# Patient Record
Sex: Male | Born: 1935 | Race: White | Hispanic: No | Marital: Married | Smoking: Former smoker
Health system: Southern US, Community
[De-identification: ages and names within clinical notes are randomized; demographics above are authoritative.]

## PROBLEM LIST (undated history)

## (undated) DIAGNOSIS — I714 Abdominal aortic aneurysm, without rupture, unspecified: Secondary | ICD-10-CM

## (undated) DIAGNOSIS — I951 Orthostatic hypotension: Secondary | ICD-10-CM

## (undated) DIAGNOSIS — E559 Vitamin D deficiency, unspecified: Secondary | ICD-10-CM

## (undated) DIAGNOSIS — N419 Inflammatory disease of prostate, unspecified: Secondary | ICD-10-CM

## (undated) DIAGNOSIS — Z8719 Personal history of other diseases of the digestive system: Secondary | ICD-10-CM

## (undated) DIAGNOSIS — E7800 Pure hypercholesterolemia, unspecified: Secondary | ICD-10-CM

## (undated) DIAGNOSIS — K635 Polyp of colon: Secondary | ICD-10-CM

## (undated) DIAGNOSIS — T884XXA Failed or difficult intubation, initial encounter: Secondary | ICD-10-CM

## (undated) DIAGNOSIS — I2510 Atherosclerotic heart disease of native coronary artery without angina pectoris: Secondary | ICD-10-CM

## (undated) DIAGNOSIS — E119 Type 2 diabetes mellitus without complications: Secondary | ICD-10-CM

## (undated) DIAGNOSIS — I10 Essential (primary) hypertension: Secondary | ICD-10-CM

## (undated) DIAGNOSIS — I639 Cerebral infarction, unspecified: Secondary | ICD-10-CM

## (undated) DIAGNOSIS — E538 Deficiency of other specified B group vitamins: Secondary | ICD-10-CM

## (undated) DIAGNOSIS — G459 Transient cerebral ischemic attack, unspecified: Secondary | ICD-10-CM

## (undated) DIAGNOSIS — K219 Gastro-esophageal reflux disease without esophagitis: Secondary | ICD-10-CM

## (undated) DIAGNOSIS — I723 Aneurysm of iliac artery: Secondary | ICD-10-CM

## (undated) DIAGNOSIS — I779 Disorder of arteries and arterioles, unspecified: Secondary | ICD-10-CM

## (undated) HISTORY — DX: Aneurysm of iliac artery: I72.3

## (undated) HISTORY — PX: CORONARY ANGIOPLASTY WITH STENT PLACEMENT: SHX49

## (undated) HISTORY — DX: Atherosclerotic heart disease of native coronary artery without angina pectoris: I25.10

## (undated) HISTORY — DX: Gastro-esophageal reflux disease without esophagitis: K21.9

## (undated) HISTORY — DX: Vitamin D deficiency, unspecified: E55.9

## (undated) HISTORY — DX: Personal history of other diseases of the digestive system: Z87.19

## (undated) HISTORY — DX: Pure hypercholesterolemia, unspecified: E78.00

## (undated) HISTORY — DX: Deficiency of other specified B group vitamins: E53.8

## (undated) HISTORY — DX: Inflammatory disease of prostate, unspecified: N41.9

## (undated) HISTORY — DX: Polyp of colon: K63.5

## (undated) HISTORY — DX: Essential (primary) hypertension: I10

## (undated) HISTORY — DX: Type 2 diabetes mellitus without complications: E11.9

## (undated) HISTORY — PX: KNEE SURGERY: SHX244

---

## 1967-05-03 HISTORY — PX: BLADDER SURGERY: SHX569

## 1998-02-03 ENCOUNTER — Ambulatory Visit (HOSPITAL_COMMUNITY): Admission: RE | Admit: 1998-02-03 | Discharge: 1998-02-03 | Payer: Self-pay | Admitting: Family Medicine

## 1998-10-16 ENCOUNTER — Encounter (INDEPENDENT_AMBULATORY_CARE_PROVIDER_SITE_OTHER): Payer: Self-pay | Admitting: Specialist

## 1998-10-16 ENCOUNTER — Ambulatory Visit (HOSPITAL_COMMUNITY): Admission: RE | Admit: 1998-10-16 | Discharge: 1998-10-16 | Payer: Self-pay | Admitting: *Deleted

## 1999-08-12 ENCOUNTER — Encounter: Payer: Self-pay | Admitting: Internal Medicine

## 1999-08-12 ENCOUNTER — Ambulatory Visit (HOSPITAL_COMMUNITY): Admission: RE | Admit: 1999-08-12 | Discharge: 1999-08-12 | Payer: Self-pay | Admitting: Internal Medicine

## 2003-03-24 ENCOUNTER — Ambulatory Visit (HOSPITAL_COMMUNITY): Admission: RE | Admit: 2003-03-24 | Discharge: 2003-03-24 | Payer: Self-pay | Admitting: Gastroenterology

## 2003-12-10 ENCOUNTER — Ambulatory Visit (HOSPITAL_COMMUNITY): Admission: RE | Admit: 2003-12-10 | Discharge: 2003-12-10 | Payer: Self-pay | Admitting: Internal Medicine

## 2004-12-21 ENCOUNTER — Encounter: Admission: RE | Admit: 2004-12-21 | Discharge: 2005-03-21 | Payer: Self-pay | Admitting: Internal Medicine

## 2005-05-06 ENCOUNTER — Ambulatory Visit (HOSPITAL_COMMUNITY): Admission: RE | Admit: 2005-05-06 | Discharge: 2005-05-06 | Payer: Self-pay | Admitting: Internal Medicine

## 2007-11-28 ENCOUNTER — Ambulatory Visit: Payer: Self-pay | Admitting: Cardiology

## 2007-11-28 ENCOUNTER — Inpatient Hospital Stay (HOSPITAL_COMMUNITY): Admission: AD | Admit: 2007-11-28 | Discharge: 2007-11-29 | Payer: Self-pay | Admitting: Cardiology

## 2007-11-29 ENCOUNTER — Ambulatory Visit: Admission: RE | Admit: 2007-11-29 | Discharge: 2007-11-29 | Payer: Self-pay | Admitting: Cardiology

## 2007-12-18 ENCOUNTER — Ambulatory Visit: Payer: Self-pay

## 2007-12-24 ENCOUNTER — Ambulatory Visit: Payer: Self-pay | Admitting: Cardiology

## 2007-12-31 ENCOUNTER — Ambulatory Visit: Payer: Self-pay | Admitting: Cardiology

## 2007-12-31 LAB — CONVERTED CEMR LAB
ALT: 11 units/L (ref 0–53)
AST: 19 units/L (ref 0–37)
Albumin: 3.6 g/dL (ref 3.5–5.2)
Alkaline Phosphatase: 68 units/L (ref 39–117)
BUN: 12 mg/dL (ref 6–23)
Basophils Absolute: 0 10*3/uL (ref 0.0–0.1)
Basophils Relative: 0.6 % (ref 0.0–3.0)
Bilirubin, Direct: 0.1 mg/dL (ref 0.0–0.3)
CO2: 32 meq/L (ref 19–32)
Calcium: 8.8 mg/dL (ref 8.4–10.5)
Chloride: 111 meq/L (ref 96–112)
Cholesterol: 88 mg/dL (ref 0–200)
Creatinine, Ser: 1.1 mg/dL (ref 0.4–1.5)
Eosinophils Absolute: 0.3 10*3/uL (ref 0.0–0.7)
Eosinophils Relative: 5.1 % — ABNORMAL HIGH (ref 0.0–5.0)
GFR calc Af Amer: 85 mL/min
GFR calc non Af Amer: 70 mL/min
Glucose, Bld: 106 mg/dL — ABNORMAL HIGH (ref 70–99)
HCT: 36.1 % — ABNORMAL LOW (ref 39.0–52.0)
HDL: 31.2 mg/dL — ABNORMAL LOW (ref >39.0)
Hemoglobin: 12.6 g/dL — ABNORMAL LOW (ref 13.0–17.0)
LDL Cholesterol: 44 mg/dL (ref 0–99)
Lymphocytes Relative: 23.8 % (ref 12.0–46.0)
MCHC: 34.8 g/dL (ref 30.0–36.0)
MCV: 96.9 fL (ref 78.0–100.0)
Monocytes Absolute: 0.5 10*3/uL (ref 0.1–1.0)
Monocytes Relative: 7.9 % (ref 3.0–12.0)
Neutro Abs: 3.9 10*3/uL (ref 1.4–7.7)
Neutrophils Relative %: 62.6 % (ref 43.0–77.0)
Platelets: 230 10*3/uL (ref 150–400)
Potassium: 4.3 meq/L (ref 3.5–5.1)
RBC: 3.73 M/uL — ABNORMAL LOW (ref 4.22–5.81)
RDW: 12.4 % (ref 11.5–14.6)
Sodium: 144 meq/L (ref 135–145)
Total Bilirubin: 0.8 mg/dL (ref 0.3–1.2)
Total CHOL/HDL Ratio: 2.8
Total Protein: 6.5 g/dL (ref 6.0–8.3)
Triglycerides: 64 mg/dL (ref 0–149)
VLDL: 13 mg/dL (ref 0–40)
WBC: 6.2 10*3/uL (ref 4.5–10.5)

## 2008-01-15 ENCOUNTER — Ambulatory Visit: Payer: Self-pay | Admitting: Vascular Surgery

## 2008-06-19 ENCOUNTER — Ambulatory Visit: Payer: Self-pay | Admitting: Cardiovascular Disease

## 2008-06-19 ENCOUNTER — Ambulatory Visit: Payer: Self-pay | Admitting: Cardiology

## 2008-06-27 ENCOUNTER — Ambulatory Visit: Payer: Self-pay | Admitting: Cardiology

## 2008-06-27 LAB — CONVERTED CEMR LAB
BUN: 17 mg/dL (ref 6–23)
CO2: 30 meq/L (ref 19–32)
Calcium: 9.1 mg/dL (ref 8.4–10.5)
Chloride: 104 meq/L (ref 96–112)
Creatinine, Ser: 1.1 mg/dL (ref 0.4–1.5)
GFR calc Af Amer: 84 mL/min
GFR calc non Af Amer: 70 mL/min
Glucose, Bld: 90 mg/dL (ref 70–99)
Potassium: 4.6 meq/L (ref 3.5–5.1)
Sodium: 140 meq/L (ref 135–145)

## 2008-07-01 ENCOUNTER — Ambulatory Visit: Payer: Self-pay | Admitting: Vascular Surgery

## 2008-10-07 ENCOUNTER — Telehealth: Payer: Self-pay | Admitting: Cardiology

## 2008-10-09 ENCOUNTER — Telehealth: Payer: Self-pay | Admitting: Cardiology

## 2008-11-24 ENCOUNTER — Ambulatory Visit: Payer: Self-pay | Admitting: Cardiology

## 2008-11-24 ENCOUNTER — Encounter (INDEPENDENT_AMBULATORY_CARE_PROVIDER_SITE_OTHER): Payer: Self-pay | Admitting: *Deleted

## 2008-11-24 LAB — CONVERTED CEMR LAB
ALT: 14 units/L (ref 0–53)
AST: 30 units/L (ref 0–37)
Albumin: 3.7 g/dL (ref 3.5–5.2)
Alkaline Phosphatase: 64 units/L (ref 39–117)
Bilirubin, Direct: 0.1 mg/dL (ref 0.0–0.3)
Cholesterol: 109 mg/dL (ref 0–200)
HDL: 37.9 mg/dL — ABNORMAL LOW (ref >39.00)
LDL Cholesterol: 56 mg/dL (ref 0–99)
Total Bilirubin: 0.9 mg/dL (ref 0.3–1.2)
Total CHOL/HDL Ratio: 3
Total Protein: 7 g/dL (ref 6.0–8.3)
Triglycerides: 77 mg/dL (ref 0.0–149.0)
VLDL: 15.4 mg/dL (ref 0.0–40.0)

## 2008-11-25 ENCOUNTER — Telehealth (INDEPENDENT_AMBULATORY_CARE_PROVIDER_SITE_OTHER): Payer: Self-pay | Admitting: *Deleted

## 2008-12-22 ENCOUNTER — Encounter: Payer: Self-pay | Admitting: Cardiology

## 2008-12-22 ENCOUNTER — Ambulatory Visit: Payer: Self-pay

## 2008-12-24 ENCOUNTER — Telehealth (INDEPENDENT_AMBULATORY_CARE_PROVIDER_SITE_OTHER): Payer: Self-pay | Admitting: *Deleted

## 2009-01-29 ENCOUNTER — Encounter: Payer: Self-pay | Admitting: Cardiology

## 2009-01-29 ENCOUNTER — Ambulatory Visit: Payer: Self-pay | Admitting: Vascular Surgery

## 2009-02-27 DIAGNOSIS — K219 Gastro-esophageal reflux disease without esophagitis: Secondary | ICD-10-CM | POA: Insufficient documentation

## 2009-02-27 DIAGNOSIS — Z8719 Personal history of other diseases of the digestive system: Secondary | ICD-10-CM | POA: Insufficient documentation

## 2009-02-27 DIAGNOSIS — I714 Abdominal aortic aneurysm, without rupture: Secondary | ICD-10-CM | POA: Insufficient documentation

## 2009-02-27 DIAGNOSIS — I2510 Atherosclerotic heart disease of native coronary artery without angina pectoris: Secondary | ICD-10-CM | POA: Insufficient documentation

## 2009-02-27 HISTORY — DX: Personal history of other diseases of the digestive system: Z87.19

## 2009-03-03 ENCOUNTER — Ambulatory Visit: Payer: Self-pay | Admitting: Cardiology

## 2009-03-09 ENCOUNTER — Encounter: Payer: Self-pay | Admitting: Cardiology

## 2009-04-28 ENCOUNTER — Telehealth: Payer: Self-pay | Admitting: Cardiology

## 2009-05-11 ENCOUNTER — Ambulatory Visit: Payer: Self-pay | Admitting: Cardiology

## 2009-05-12 LAB — CONVERTED CEMR LAB
ALT: 11 units/L (ref 0–53)
AST: 19 units/L (ref 0–37)
Albumin: 3.5 g/dL (ref 3.5–5.2)
Alkaline Phosphatase: 59 units/L (ref 39–117)
Bilirubin, Direct: 0 mg/dL (ref 0.0–0.3)
Cholesterol: 166 mg/dL (ref 0–200)
HDL: 38.5 mg/dL — ABNORMAL LOW (ref >39.00)
LDL Cholesterol: 106 mg/dL — ABNORMAL HIGH (ref 0–99)
Total Bilirubin: 0.7 mg/dL (ref 0.3–1.2)
Total CHOL/HDL Ratio: 4
Total Protein: 6.7 g/dL (ref 6.0–8.3)
Triglycerides: 110 mg/dL (ref 0.0–149.0)
VLDL: 22 mg/dL (ref 0.0–40.0)

## 2009-06-16 ENCOUNTER — Telehealth (INDEPENDENT_AMBULATORY_CARE_PROVIDER_SITE_OTHER): Payer: Self-pay | Admitting: *Deleted

## 2009-06-16 ENCOUNTER — Ambulatory Visit (HOSPITAL_COMMUNITY): Admission: RE | Admit: 2009-06-16 | Discharge: 2009-06-16 | Payer: Self-pay | Admitting: Internal Medicine

## 2009-10-29 ENCOUNTER — Telehealth: Payer: Self-pay | Admitting: Cardiology

## 2009-12-14 ENCOUNTER — Encounter: Payer: Self-pay | Admitting: Cardiology

## 2009-12-22 ENCOUNTER — Encounter: Payer: Self-pay | Admitting: Cardiology

## 2009-12-23 ENCOUNTER — Ambulatory Visit: Payer: Self-pay

## 2009-12-23 ENCOUNTER — Encounter: Payer: Self-pay | Admitting: Cardiology

## 2010-03-12 ENCOUNTER — Ambulatory Visit: Payer: Self-pay | Admitting: Cardiology

## 2010-03-12 ENCOUNTER — Encounter: Payer: Self-pay | Admitting: Cardiology

## 2010-04-05 ENCOUNTER — Ambulatory Visit (HOSPITAL_COMMUNITY)
Admission: EM | Admit: 2010-04-05 | Discharge: 2010-04-07 | Payer: Self-pay | Source: Home / Self Care | Attending: Cardiology | Admitting: Cardiology

## 2010-05-06 ENCOUNTER — Ambulatory Visit
Admission: RE | Admit: 2010-05-06 | Discharge: 2010-05-06 | Payer: Self-pay | Source: Home / Self Care | Attending: Cardiology | Admitting: Cardiology

## 2010-05-06 ENCOUNTER — Encounter (HOSPITAL_COMMUNITY): Admission: RE | Admit: 2010-05-06 | Payer: Self-pay | Source: Home / Self Care | Admitting: Cardiology

## 2010-06-01 NOTE — Progress Notes (Signed)
  Phone Note Outgoing Call   Call placed by: Deliah Goody, RN,  November 25, 2008 11:45 AM Summary of Call: spoke with pt, he was seen by research yesterday and stated he was having the same type of pain he had when taking zocor. he is currently taking crestor. option given to pt to stop crestor or to cut in half to see if that helps his discomfort. pt declined at this time and will cont on crestor until seen by dr Jens Som in november. he will call prior to that appt with any problems. Deliah Goody, RN  November 25, 2008 11:48 AM

## 2010-06-01 NOTE — Progress Notes (Signed)
----   Converted from flag ---- ---- 12/23/2008 7:47 AM, Ferman Hamming, MD, [facility] wrote: please ask pt to continue plavix until at least next ov. BC ---- 12/22/2008 8:47 AM, Missy Al-Rammal, RVT, RDCS wrote: Pt was in today for iliac aneurysm surveillance (stable).  He said he was supposed to be on Plavix for one year, and wants to know when/how/whether he can stop taking it now.  Please advise. (His phone # [phone]).  Thanks. ------------------------------       Additional Follow-up for Phone Call Additional follow up Details #2::    spoke with pt, he has an appt with dr Jens Som on 03/03/09, he will cont plavix until seen Deliah Goody, RN  December 24, 2008 2:54 PM

## 2010-06-01 NOTE — Letter (Signed)
Summary: [facility] Note  [facility] Note   Imported By: Roderic Ovens 02/11/2009 15:49:23  _____________________________________________________________________  External Attachment:    Type:   Image     Comment:   External Document

## 2010-06-01 NOTE — Letter (Signed)
Summary: Custom - Lipid  [facility], Main Office  [address]   [place], [place] [address]   Phone: [phone]  Fax: [phone]     November 24, 2008 MRN: [id number]   [name] [address] [place], [place]  [address]   Dear Mr. Bechtol,  We have reviewed your cholesterol results.  They are as follows:     Total Cholesterol:    109 (Desirable: less than 200)       HDL  Cholesterol:     37.90  (Desirable: greater than 40 for men and 50 for women)       LDL Cholesterol:       56  (Desirable: less than 100 for low risk and less than 70 for moderate to high risk)       Triglycerides:       77.0  (Desirable: less than 150)  Our recommendations include:These numbers look good. Continue on the same medicine. Liver function is normal. Take care, Dr. Darel Hong.    Call our office at the number listed above if you have any questions.  Lowering your LDL cholesterol is important, but it is only one of a large number of "risk factors" that may indicate that you are at risk for heart disease, stroke or other complications of hardening of the arteries.  Other risk factors include:   A.  Cigarette Smoking* B.  High Blood Pressure* C.  Obesity* D.   Low HDL Cholesterol (see yours above)* E.   Diabetes Mellitus (higher risk if your is uncontrolled) F.  Family history of premature heart disease G.  Previous history of stroke or cardiovascular disease    *These are risk factors YOU HAVE CONTROL OVER.  For more information, visit .  There is now evidence that lowering the TOTAL CHOLESTEROL AND LDL CHOLESTEROL can reduce the risk of heart disease.  The American Heart Association recommends the following guidelines for the treatment of elevated cholesterol:  1.  If there is now current heart disease and less than two risk factors, TOTAL CHOLESTEROL should be less than 200 and LDL CHOLESTEROL should be less than 100. 2.  If there is current heart disease or  two or more risk factors, TOTAL CHOLESTEROL should be less than 200 and LDL CHOLESTEROL should be less than 70.  A diet low in cholesterol, saturated fat, and calories is the cornerstone of treatment for elevated cholesterol.  Cessation of smoking and exercise are also important in the management of elevated cholesterol and preventing vascular disease.  Studies have shown that 30 to 60 minutes of physical activity most days can help lower blood pressure, lower cholesterol, and keep your weight at a healthy level.  Drug therapy is used when cholesterol levels do not respond to therapeutic lifestyle changes (smoking cessation, diet, and exercise) and remains unacceptably high.  If medication is started, it is important to have you levels checked periodically to evaluate the need for further treatment options.  Thank you,    [employer] Team

## 2010-06-01 NOTE — Assessment & Plan Note (Signed)
Summary: per check out/sf   History of Present Illness: [name] is a pleasant  gentleman who has a history of coronary artery disease.  He had prior PCI of his LAD with a drug- eluting stent in July 2009.  He also has bilateral common iliac artery aneurysms. Last abdominal and iliac ultrasound was performed in August of 2011. There was an ectatic aorta. The iliac aneurysms were unchanged in size and measured 2.4 cm on the left and 2.1 cm on the right. I last saw him in November of 2010. Since then he denies any dyspnea on exertion, orthopnea, PND, pedal edema, palpitations, syncope or chest pain.   Current Medications (verified): 1)  Plavix 75 Mg Tabs (Clopidogrel Bisulfate) .... Take One Tablet By Mouth Daily 2)  Atenolol 25 Mg Tabs (Atenolol) .... Take Half Tablet By Mouth Daily 3)  Omeprazole 20 Mg Cpdr (Omeprazole) .Marland Kitchen.. 1 Tab By Mouth Once Daily 4)  Aspirin 81 Mg  Tabs (Aspirin) .... 4 Tabs By Mouth Once Daily 5)  Vitamin D3 2000 Unit Caps (Cholecalciferol) .... Tab By Mouth Once Daily 6)  B Complex  Tabs (B Complex Vitamins) .Marland Kitchen.. 1 Tab By Mouth Once Daily 7)  Multivitamins   Tabs (Multiple Vitamin) .Marland Kitchen.. 1 Tab By Mouth Once Daily 8)  Vit B 12 Inj .... Monthly 9)  Nitrostat 0.4 Mg Subl (Nitroglycerin) .Marland Kitchen.. 1 Tablet Under Tongue At Onset of Chest Pain; You May Repeat Every 5 Minutes For Up To 3 Doses. 10)  Gemfibrozil 600 Mg Tabs (Gemfibrozil) .Marland Kitchen.. 1 Tab By Mouth Two Times A Day 11)  Flax Seed Oil  (Flaxseed (Linseed)) .Marland Kitchen.. 1 Tab By Mouth Once Daily 12)  Citrucel .... Daily  Past History:  Past Medical History: CAD (ICD-414.00) ILIAC ARTERY ANEURYSM (ICD-442.2) IRRITABLE BOWEL SYNDROME, HX OF (ICD-V12.79) GERD (ICD-530.81) OTHER AND UNSPECIFIED HYPERLIPIDEMIA (ICD-272.4)  Social History: Reviewed history from 03/03/2009 and no changes required. Tobacco Use - No.   Review of Systems       Some arthralgias but no fevers or chills, productive cough, hemoptysis, dysphasia,  odynophagia, melena, hematochezia, dysuria, hematuria, rash, seizure activity, orthopnea, PND, pedal edema, claudication. Remaining systems are negative.   Vital Signs:  Patient profile:   77 year old male Height:      72 inches Weight:      178 pounds BMI:     24.23 Pulse rate:   51 / minute Resp:     14 per minute BP sitting:   119 / 65  (left arm)  Vitals Entered By: Kem Parkinson (March 12, 2010 2:14 PM)  Physical Exam  General:  Well-developed well-nourished in no acute distress.  Skin is warm and dry.  HEENT is normal.  Neck is supple. No thyromegaly.  Chest is clear to auscultation with normal expansion.  Cardiovascular exam is regular rate and rhythm.  Abdominal exam nontender or distended. No masses palpated. Extremities show no edema. neuro grossly intact    EKG  Procedure date:  03/12/2010  Findings:      Sinus rhythm with no ST changes.  Impression & Recommendations:  Problem # 1:  CAD (ICD-414.00) Continue aspirin but discontinued Plavix. Continue beta blocker. Intolerant to statins. The following medications were removed from the medication list:    Plavix 75 Mg Tabs (Clopidogrel bisulfate) .Marland Kitchen... Take one tablet by mouth daily His updated medication list for this problem includes:    Atenolol 25 Mg Tabs (Atenolol) .Marland Kitchen... Take half tablet by mouth daily    Aspirin  81 Mg Tabs (Aspirin) .Marland KitchenMarland KitchenMarland KitchenMarland Kitchen 4 tabs by mouth once daily    Nitrostat 0.4 Mg Subl (Nitroglycerin) .Marland Kitchen... 1 tablet under tongue at onset of chest pain; you may repeat every 5 minutes for up to 3 doses.  Problem # 2:  ILIAC ARTERY ANEURYSM (ICD-442.2) Followup ultrasound in August of 2012.  Problem # 3:  GERD (ICD-530.81)  His updated medication list for this problem includes:    Omeprazole 20 Mg Cpdr (Omeprazole) .Marland Kitchen... 1 tab by mouth once daily  Problem # 4:  OTHER AND UNSPECIFIED HYPERLIPIDEMIA (ICD-272.4) Intolerant to statins. Continue present medications. Lipid and liver monitored by  primary care. The following medications were removed from the medication list:    Pravachol 40 Mg Tabs (Pravastatin sodium) .Marland Kitchen... 1 in the evening His updated medication list for this problem includes:    Gemfibrozil 600 Mg Tabs (Gemfibrozil) .Marland Kitchen... 1 tab by mouth two times a day  Patient Instructions: 1)  Your physician has recommended you make the following change in your medication: STOP PLAVIX 2)  Your physician wants you to follow-up in:ONE YEAR   You will receive a reminder letter in the mail two months in advance. If you don't receive a letter, please call our office to schedule the follow-up appointment.

## 2010-06-01 NOTE — Progress Notes (Signed)
  Phone Note From Other Clinic   Caller: Judy/Dr.[email or web address] Details for Reason: Pt.information Initial call taken by: Marijean Niemann Labs over to [phone] [facility]  June 16, 2009 9:23 AM

## 2010-06-01 NOTE — Miscellaneous (Signed)
Summary: Orders Update  Clinical Lists Changes  Problems: Added new problem of ILIAC ARTERY ANEURYSM (ICD-442.2) Orders: Added new Test order of Abdominal Aorta Duplex (Abd Aorta Duplex) - Signed 

## 2010-06-01 NOTE — Assessment & Plan Note (Signed)
Summary: ROV/ F/U 9 MONTHS/ GD   CC:  check up.  History of Present Illness: [name] is a pleasant  gentleman who has a history of coronary artery disease.  He had prior PCI of his LAD with a drug- eluting stent in July 2009.  He also has bilateral common iliac artery aneurysms. Last abdominal and iliac ultrasound was performed in August of this year. There was no aortic aneurysm. The iliac aneurysms were unchanged in size and measured 2.4 cm on the left and 2.2 cm on the right. I last saw him in February of this year. Since then he denies any dyspnea on exertion, orthopnea, PND, pedal edema, palpitations, syncope or chest pain. He is having difficulties with pain in the muscles in his legs and arms since starting Crestor. This is continuous and causes him difficulty ambulating. He had similar problems with Zocor.  Preventive Screening-Counseling & Management  Alcohol-Tobacco     Smoking Status: never  Current Medications (verified): 1)  Pravachol 40 Mg Tabs (Pravastatin Sodium) .Marland Kitchen.. 1 in The Evening 2)  Plavix 75 Mg Tabs (Clopidogrel Bisulfate) .... Take One Tablet By Mouth Daily 3)  Atenolol 25 Mg Tabs (Atenolol) .... Take Half Tablet By Mouth Daily 4)  Darvocet-N 100 100-650 Mg Tabs (Propoxyphene N-Apap) .... As Needed 5)  Omeprazole 20 Mg Cpdr (Omeprazole) .Marland Kitchen.. 1 Tab By Mouth Once Daily 6)  Aspirin 81 Mg  Tabs (Aspirin) .... 4 Tabs By Mouth Once Daily 7)  Vitamin D3 2000 Unit Caps (Cholecalciferol) .... Tab By Mouth Once Daily 8)  B Complex  Tabs (B Complex Vitamins) .Marland Kitchen.. 1 Tab By Mouth Once Daily 9)  Multivitamins   Tabs (Multiple Vitamin) .Marland Kitchen.. 1 Tab By Mouth Once Daily 10)  Vit B 12 Inj .... Weekly  Past History:  Past Medical History: Current Problems:  CAD (ICD-414.00) ILIAC ARTERY ANEURYSM (ICD-442.2) IRRITABLE BOWEL SYNDROME, HX OF (ICD-V12.79) GERD (ICD-530.81) AAA (ICD-441.4) OTHER AND UNSPECIFIED HYPERLIPIDEMIA (ICD-272.4) ENCOUNTER FOR LONG-TERM USE OF OTHER  MEDICATIONS (ICD-V58.69)  Social History: Reviewed history from 02/27/2009 and no changes required. Tobacco Use - No.  Smoking Status:  never  Review of Systems       Problems with myalgias but no fevers or chills, productive cough, hemoptysis, dysphasia, odynophagia, melena, hematochezia, dysuria, hematuria, rash, seizure activity, orthopnea, PND, pedal edema, claudication. Remaining systems are negative.   Vital Signs:  Patient profile:   77 year old male Height:      72 inches Weight:      178 pounds BMI:     24.23 Pulse rate:   55 / minute Resp:     12 per minute BP sitting:   121 / 68  (left arm)  Vitals Entered By: [name] (March 03, 2009 8:17 AM)  Physical Exam  General:  well-developed well-nourished in no acute distress. Skin is warm and dry. Head:  HEENT is normal. Neck:  supple with no bruits. No thyromegaly noted. Lungs:  clear to auscultation Heart:  regular rate and rhythm Abdomen:  soft and nontender. No masses palpated. Extremities:  no edema. Neurologic:  grossly intact.   EKG  Procedure date:  03/03/2009  Findings:      sinus bradycardia at a rate of 51. First degree AV block. No ST changes.  Impression & Recommendations:  Problem # 1:  CAD (ICD-414.00) Continue aspirin and Plavix as well as low-dose beta blocker. The following medications were removed from the medication list:    Lisinopril 10 Mg Tabs (  Lisinopril) .Marland Kitchen... 1 tablet by mouth once a day His updated medication list for this problem includes:    Plavix 75 Mg Tabs (Clopidogrel bisulfate) .Marland Kitchen... Take one tablet by mouth daily    Atenolol 25 Mg Tabs (Atenolol) .Marland Kitchen... Take half tablet by mouth daily    Aspirin 81 Mg Tabs (Aspirin) .Marland KitchenMarland KitchenMarland KitchenMarland Kitchen 4 tabs by mouth once daily  Orders: EKG w/ Interpretation (93000)  Problem # 2:  ILIAC ARTERY ANEURYSM (ICD-442.2) Patient will need followup ultrasound in August of 2011.  Problem # 3:  AAA (ICD-441.4) Patient will need followup  ultrasound in August of 2011.  Problem # 4:  OTHER AND UNSPECIFIED HYPERLIPIDEMIA (ICD-272.4) Patient has developed myalgias on Crestor. He also had this previously with Zocor. We'll discontinue Crestor. In one month we'll begin Pravachol 40 mg p.o. daily to see if he tolerates. If not we will discontinue statins altogether. If he does tolerate we'll check lipids and liver 6 weeks later. His updated medication list for this problem includes:    Pravachol 40 Mg Tabs (Pravastatin sodium) .Marland Kitchen... 1 in the evening  Problem # 5:  GERD (ICD-530.81)  His updated medication list for this problem includes:    Omeprazole 20 Mg Cpdr (Omeprazole) .Marland Kitchen... 1 tab by mouth once daily  Problem # 6:  IRRITABLE BOWEL SYNDROME, HX OF (ICD-V12.79)  Patient Instructions: 1)  Your physician has recommended you make the following change in your medication:  2)  Stop Crestor  3)  Start Pravachol 40mg  1 in the evening--DO NOT start this until you have been off Crestor for 1 month--this will be November 30,2010 --once you start Pravachol if you have problems taking it call our office-- [phone]  4)  Your physician recommends that you return for a FASTING lipid profile/liver profle 6 weeks after starting Pravachol  5)  Your physician recommends that you schedule a follow-up appointment in: 1 year with Dr Shelda Pal should get a letter in the mail 2 months before the appointment time --if you do not get a letter call our office [phone]  Prescriptions: PRAVACHOL 40 MG TABS (PRAVASTATIN SODIUM) 1 in the evening  #30 x 3   Entered by:   Dennis Dung, RN, BSN   Authorized by:   Ferman Hamming, MD, [facility]   Signed by:   Dennis Dung, RN, BSN on 03/03/2009   Method used:   [facility]        [facility]  [place] [address]ret[facility])       [address]       [place], [place]  [address]       Ph: [phone] or [phone]       Fax: [phone]   RxID:   [id number]   Prevention & Chronic  Care Immunizations   Influenza vaccine: Not documented    Tetanus booster: Not documented    Pneumococcal vaccine: Not documented    H. zoster vaccine: Not documented  Colorectal Screening   Hemoccult: Not documented    Colonoscopy: Not documented  Other Screening   PSA: Not documented   Smoking status: never  (03/03/2009)  Lipids   Total Cholesterol: 109  (11/24/2008)   LDL: 56  (11/24/2008)   LDL Direct: Not documented   HDL: 37.90  (11/24/2008)   Triglycerides: 77.0  (11/24/2008)    SGOT (AST): 30  (11/24/2008)   SGPT (ALT): 14  (11/24/2008)   Alkaline phosphatase: 64  (11/24/2008)   Total bilirubin: 0.9  (11/24/2008)  Self-Management Support :  Lipid self-management support: Not documented

## 2010-06-01 NOTE — Progress Notes (Signed)
Summary: refill  Phone Note Refill Request Message from:  Patient on October 29, 2009 8:44 AM  Refills Requested: Medication #1:  Nitro [facility] [phone]  Initial call taken by: Judie Grieve,  October 29, 2009 8:45 AM    New/Updated Medications: NITROSTAT 0.4 MG SUBL (NITROGLYCERIN) 1 tablet under tongue at onset of chest pain; you may repeat every 5 minutes for up to 3 doses. Prescriptions: NITROSTAT 0.4 MG SUBL (NITROGLYCERIN) 1 tablet under tongue at onset of chest pain; you may repeat every 5 minutes for up to 3 doses.  #30 x 12   Entered by:   Kem Parkinson   Authorized by:   Ferman Hamming, MD, [facility]   Signed by:   Kem Parkinson on 10/29/2009   Method used:   [facility]        [facility]  [place] [address]       [address]       [place], [place]  [address]       Ph: [phone] or [phone]       Fax: [phone]   RxID:   [id number]

## 2010-06-01 NOTE — Progress Notes (Signed)
Summary: pt has questions about medication and symptoms he is having  Medications Added LISINOPRIL 10 MG TABS (LISINOPRIL) 1 tablet by mouth once a day       Phone Note Call from Patient Call back at Home Phone [phone]   Caller: Patient Reason for Call: Talk to Nurse, Talk to Doctor Summary of Call: pt is taking simvastatin and it is making him sick / his muscles ache his bones ache and he is gonna stop taking it. pt B/P 95/51 this am and he thinks that is a little low he is taking lisinopril 10mg  what should he do about that Initial call taken by: Omer Jack,  October 07, 2008 10:16 AM  Follow-up for Phone Call        Talked to pt. c/o of bone and muscle ache.According to pt symtoms started after Md put him on Simvastatin 20mg  after a stent placement on Oct. 09. pt stoped taken medication two days ago. He allready feels better. Also he c/o feeling tired his B/P 95/51 today . Dr. Juanda Chance DOD recomended for pt to stop Simvastatin also to cut  Lisinopril  to 5mg   once a day. Pt. verbalised undertanding.  Follow-up by: Ollen Gross, RN, BSN,  October 07, 2008 11:14 AM    New/Updated Medications: LISINOPRIL 10 MG TABS (LISINOPRIL) 1 tablet by mouth once a day   Appended Document: pt has questions about medication and symptoms he is having dc zocor; if symptoms improve, add crestor 10 mg by mouth daily later to see iif he tolerates and then lipids and liver in 6 weeks; dc lisinopril; watch BP; resume if SBP > 130 or DBP > 85  Appended Document: pt has questions about medication and symptoms he is having LMTCB RE MEDS ./CY  Appended Document: pt has questions about medication and symptoms he is having PT AWARE OF LAB RESULTS./CY

## 2010-06-01 NOTE — Miscellaneous (Signed)
Summary: Orders Update  Clinical Lists Changes  Orders: Added new Test order of Abdominal Aorta Duplex (Abd Aorta Duplex) - Signed 

## 2010-06-01 NOTE — Progress Notes (Signed)
Summary: RETURNING CALLED BACK  Phone Note Call from Patient Call back at Home Phone [phone]   Caller: Patient Reason for Call: Talk to Nurse Summary of Call: RETURNING CHRISTINE CALLED BACK. Initial call taken by: Lorne Skeens,  October 09, 2008 12:55 PM  Follow-up for Phone Call        Phone Call Completed, Rx Called In Follow-up by: Scherrie Bateman, LPN,  October 09, 2008 1:29 PM

## 2010-06-01 NOTE — Progress Notes (Signed)
Summary: meds  Phone Note Call from Patient Call back at Home Phone [phone]   Caller: Patient Reason for Call: Talk to Nurse Summary of Call: pt can not take PRAVASTATIN SODIUM, it is making his joints ache Initial call taken by: [name],  April 28, 2009 1:22 PM  Follow-up for Phone Call        spoke with pt, he has stopped the pravastatin and feels better. okay given for pt to stay off pravachol. will let dr Jens Som know pt unable to tolerate crestor, zocor and pravastatin Deliah Goody, RN  April 28, 2009 1:36 PM

## 2010-06-03 NOTE — Assessment & Plan Note (Signed)
Summary: eph   History of Present Illness: [name] is a pleasant  gentleman who has a history of coronary artery disease.  He had prior PCI of his LAD with a drug- eluting stent in July 2009.  He also has bilateral common iliac artery aneurysms. Last abdominal and iliac ultrasound was performed in August of 2011. There was an ectatic aorta. The iliac aneurysms were unchanged in size and measured 2.4 cm on the left and 2.1 cm on the right. Admitted in December of 2011 with chest pain. He had cardiac catheterization which revealed  preserved LV function. Normal LM. 30% mid LAD;  40-50% distal LAD; subbranch of D3 70%; Lcx about 50% proximal lesion before the first small marginal. The right coronary artery has a high-grade stenosis of 95% in the midportion. Pt had PCI of RCA with DES. Since then, the patient denies any dyspnea on exertion, orthopnea, PND, pedal edema, palpitations, syncope or chest pain.   Current Medications (verified): 1)  Atenolol 25 Mg Tabs (Atenolol) .... Take Half Tablet By Mouth Daily 2)  Omeprazole 20 Mg Cpdr (Omeprazole) .Marland Kitchen.. 1 Tab By Mouth Once Daily 3)  Aspirin 325 Mg  Tabs (Aspirin) .Marland Kitchen.. 1  Tab By Mouth Once Daily 4)  Vitamin D3 2000 Unit Caps (Cholecalciferol) .... Tab By Mouth Once Daily 5)  Multivitamins   Tabs (Multiple Vitamin) .Marland Kitchen.. 1 Tab By Mouth Once Daily 6)  Vit B 12 Inj .... Monthly 7)  Nitrostat 0.4 Mg Subl (Nitroglycerin) .Marland Kitchen.. 1 Tablet Under Tongue At Onset of Chest Pain; You May Repeat Every 5 Minutes For Up To 3 Doses. 8)  Gemfibrozil 600 Mg Tabs (Gemfibrozil) .Marland Kitchen.. 1 Tab By Mouth Two Times A Day 9)  Flax Seed Oil  (Flaxseed (Linseed)) .Marland Kitchen.. 1 Tab By Mouth Once Daily 10)  Citrucel .... Daily 11)  Plavix 75 Mg Tabs (Clopidogrel Bisulfate) .... Take One Tablet By Mouth Daily  Past History:  Past Medical History: Reviewed history from 03/12/2010 and no changes required. CAD (ICD-414.00) ILIAC ARTERY ANEURYSM (ICD-442.2) IRRITABLE BOWEL SYNDROME, HX OF  (ICD-V12.79) GERD (ICD-530.81) OTHER AND UNSPECIFIED HYPERLIPIDEMIA (ICD-272.4)  Social History: Reviewed history from 03/03/2009 and no changes required. Tobacco Use - No.   Review of Systems       no fevers or chills, productive cough, hemoptysis, dysphasia, odynophagia, melena, hematochezia, dysuria, hematuria, rash, seizure activity, orthopnea, PND, pedal edema, claudication. Remaining systems are negative.   Vital Signs:  Patient profile:   77 year old male Height:      72 inches Weight:      181 pounds BMI:     24.64 Pulse rate:   60 / minute Resp:     14 per minute BP sitting:   140 / 70  (left arm)  Vitals Entered By: Kem Parkinson (May 06, 2010 12:27 PM)  Physical Exam  General:  Well-developed well-nourished in no acute distress.  Skin is warm and dry.  HEENT is normal.  Neck is supple. No thyromegaly.  Chest is clear to auscultation with normal expansion.  Cardiovascular exam is regular rate and rhythm.  Abdominal exam nontender or distended. No masses palpated. Right groin with no hematoma and no bruit. Extremities show no edema. neuro grossly intact    Impression & Recommendations:  Problem # 1:  CAD (ICD-414.00) Continue aspirin, Plavix and beta blocker. Intolerant to statins. His updated medication list for this problem includes:    Atenolol 25 Mg Tabs (Atenolol) .Marland Kitchen... Take half tablet by mouth daily  Aspirin 325 Mg Tabs (Aspirin) .Marland Kitchen... 1  tab by mouth once daily    Nitrostat 0.4 Mg Subl (Nitroglycerin) .Marland Kitchen... 1 tablet under tongue at onset of chest pain; you may repeat every 5 minutes for up to 3 doses.    Plavix 75 Mg Tabs (Clopidogrel bisulfate) .Marland Kitchen... Take one tablet by mouth daily  Problem # 2:  ILIAC ARTERY ANEURYSM (ICD-442.2) Plan followup ultrasound August 2012.  Problem # 3:  OTHER AND UNSPECIFIED HYPERLIPIDEMIA (ICD-272.4) Continue present medications. Followed by primary care. His updated medication list for this problem  includes:    Gemfibrozil 600 Mg Tabs (Gemfibrozil) .Marland Kitchen... 1 tab by mouth two times a day  Problem # 4:  GERD (ICD-530.81)  His updated medication list for this problem includes:    Omeprazole 20 Mg Cpdr (Omeprazole) .Marland Kitchen... 1 tab by mouth once daily  Patient Instructions: 1)  Your physician wants you to follow-up in: 6 MONTHS  You will receive a reminder letter in the mail two months in advance. If you don't receive a letter, please call our office to schedule the follow-up appointment.

## 2010-07-13 LAB — CARDIAC PANEL(CRET KIN+CKTOT+MB+TROPI)
CK, MB: 1.1 ng/mL (ref 0.3–4.0)
CK, MB: 1.3 ng/mL (ref 0.3–4.0)
CK, MB: 1.8 ng/mL (ref 0.3–4.0)
Relative Index: INVALID (ref 0.0–2.5)
Relative Index: INVALID (ref 0.0–2.5)
Relative Index: INVALID (ref 0.0–2.5)
Total CK: 44 U/L (ref 7–232)
Total CK: 47 U/L (ref 7–232)
Total CK: 55 U/L (ref 7–232)
Troponin I: 0.01 ng/mL (ref 0.00–0.06)
Troponin I: 0.02 ng/mL (ref 0.00–0.06)
Troponin I: 0.02 ng/mL (ref 0.00–0.06)

## 2010-07-13 LAB — BASIC METABOLIC PANEL
BUN: 13 mg/dL (ref 6–23)
BUN: 16 mg/dL (ref 6–23)
CO2: 27 mEq/L (ref 19–32)
CO2: 28 mEq/L (ref 19–32)
Calcium: 8.2 mg/dL — ABNORMAL LOW (ref 8.4–10.5)
Calcium: 8.6 mg/dL (ref 8.4–10.5)
Chloride: 106 mEq/L (ref 96–112)
Chloride: 110 mEq/L (ref 96–112)
Creatinine, Ser: 1.04 mg/dL (ref 0.4–1.5)
Creatinine, Ser: 1.08 mg/dL (ref 0.4–1.5)
GFR calc Af Amer: >60 mL/min (ref >60)
GFR calc Af Amer: >60 mL/min (ref >60)
GFR calc non Af Amer: >60 mL/min (ref >60)
GFR calc non Af Amer: >60 mL/min (ref >60)
Glucose, Bld: 107 mg/dL — ABNORMAL HIGH (ref 70–99)
Glucose, Bld: 99 mg/dL (ref 70–99)
Potassium: 3.9 mEq/L (ref 3.5–5.1)
Potassium: 3.9 mEq/L (ref 3.5–5.1)
Sodium: 140 mEq/L (ref 135–145)
Sodium: 140 mEq/L (ref 135–145)

## 2010-07-13 LAB — CBC
HCT: 30.1 % — ABNORMAL LOW (ref 39.0–52.0)
HCT: 33.3 % — ABNORMAL LOW (ref 39.0–52.0)
HCT: 34.1 % — ABNORMAL LOW (ref 39.0–52.0)
Hemoglobin: 10.2 g/dL — ABNORMAL LOW (ref 13.0–17.0)
Hemoglobin: 11.2 g/dL — ABNORMAL LOW (ref 13.0–17.0)
Hemoglobin: 11.5 g/dL — ABNORMAL LOW (ref 13.0–17.0)
MCH: 32.6 pg (ref 26.0–34.0)
MCH: 32.6 pg (ref 26.0–34.0)
MCH: 32.8 pg (ref 26.0–34.0)
MCHC: 33.6 g/dL (ref 30.0–36.0)
MCHC: 33.7 g/dL (ref 30.0–36.0)
MCHC: 33.9 g/dL (ref 30.0–36.0)
MCV: 96.6 fL (ref 78.0–100.0)
MCV: 96.8 fL (ref 78.0–100.0)
MCV: 96.8 fL (ref 78.0–100.0)
Platelets: 193 10*3/uL (ref 150–400)
Platelets: 213 10*3/uL (ref 150–400)
Platelets: 224 10*3/uL (ref 150–400)
RBC: 3.11 MIL/uL — ABNORMAL LOW (ref 4.22–5.81)
RBC: 3.44 MIL/uL — ABNORMAL LOW (ref 4.22–5.81)
RBC: 3.53 MIL/uL — ABNORMAL LOW (ref 4.22–5.81)
RDW: 12.7 % (ref 11.5–15.5)
RDW: 12.7 % (ref 11.5–15.5)
RDW: 12.8 % (ref 11.5–15.5)
WBC: 6.6 10*3/uL (ref 4.0–10.5)
WBC: 6.9 10*3/uL (ref 4.0–10.5)
WBC: 7 10*3/uL (ref 4.0–10.5)

## 2010-07-13 LAB — CK TOTAL AND CKMB (NOT AT ARMC)
CK, MB: 1.7 ng/mL (ref 0.3–4.0)
Relative Index: INVALID (ref 0.0–2.5)
Total CK: 61 U/L (ref 7–232)

## 2010-07-13 LAB — DIFFERENTIAL
Basophils Absolute: 0 10*3/uL (ref 0.0–0.1)
Basophils Relative: 0 % (ref 0–1)
Eosinophils Absolute: 0.2 10*3/uL (ref 0.0–0.7)
Eosinophils Relative: 2 % (ref 0–5)
Lymphocytes Relative: 26 % (ref 12–46)
Lymphs Abs: 1.8 10*3/uL (ref 0.7–4.0)
Monocytes Absolute: 0.5 10*3/uL (ref 0.1–1.0)
Monocytes Relative: 7 % (ref 3–12)
Neutro Abs: 4.5 10*3/uL (ref 1.7–7.7)
Neutrophils Relative %: 65 % (ref 43–77)

## 2010-07-13 LAB — COMPREHENSIVE METABOLIC PANEL
ALT: 13 U/L (ref 0–53)
AST: 22 U/L (ref 0–37)
Albumin: 3.3 g/dL — ABNORMAL LOW (ref 3.5–5.2)
Alkaline Phosphatase: 67 U/L (ref 39–117)
BUN: 16 mg/dL (ref 6–23)
CO2: 28 mEq/L (ref 19–32)
Calcium: 8.7 mg/dL (ref 8.4–10.5)
Chloride: 105 mEq/L (ref 96–112)
Creatinine, Ser: 1.09 mg/dL (ref 0.4–1.5)
GFR calc Af Amer: >60 mL/min (ref >60)
GFR calc non Af Amer: >60 mL/min (ref >60)
Glucose, Bld: 99 mg/dL (ref 70–99)
Potassium: 4.3 mEq/L (ref 3.5–5.1)
Sodium: 138 mEq/L (ref 135–145)
Total Bilirubin: 0.5 mg/dL (ref 0.3–1.2)
Total Protein: 6.3 g/dL (ref 6.0–8.3)

## 2010-07-13 LAB — GLUCOSE, CAPILLARY
Glucose-Capillary: 105 mg/dL — ABNORMAL HIGH (ref 70–99)
Glucose-Capillary: 106 mg/dL — ABNORMAL HIGH (ref 70–99)
Glucose-Capillary: 109 mg/dL — ABNORMAL HIGH (ref 70–99)
Glucose-Capillary: 125 mg/dL — ABNORMAL HIGH (ref 70–99)
Glucose-Capillary: 98 mg/dL (ref 70–99)

## 2010-07-13 LAB — PLATELET INHIBITION P2Y12
P2Y12 % Inhibition: 10 %
Platelet Function  P2Y12: 274 [PRU] (ref 194–418)
Platelet Function Baseline: 305 [PRU] (ref 194–418)

## 2010-07-13 LAB — PROTIME-INR
INR: 1.05 (ref 0.00–1.49)
INR: 1.11 (ref 0.00–1.49)
Prothrombin Time: 13.9 seconds (ref 11.6–15.2)
Prothrombin Time: 14.5 seconds (ref 11.6–15.2)

## 2010-07-13 LAB — HEPARIN LEVEL (UNFRACTIONATED): Heparin Unfractionated: 0.15 IU/mL — ABNORMAL LOW (ref 0.30–0.70)

## 2010-07-13 LAB — APTT: aPTT: 157 seconds — ABNORMAL HIGH (ref 24–37)

## 2010-07-13 LAB — TROPONIN I: Troponin I: 0.02 ng/mL (ref 0.00–0.06)

## 2010-09-14 NOTE — H&P (Signed)
NAME:  [name], VO NO.:  [id number]   MEDICAL RECORD NO.:  [id number]          PATIENT TYPE:  INP   LOCATION:  2629                         FACILITY:  [facility]   PHYSICIAN:  Madolyn Frieze. Jens Som, MD, FACCDATE OF BIRTH:  1934-03-10   DATE OF ADMISSION:  11/28/2007  DATE OF DISCHARGE:                              HISTORY & PHYSICAL   Mr. Karger is a 77 year old male with past medical history of diet-  controlled diabetes mellitus, who we are asked to evaluate for chest  pain.  Note, the patient has a long history of atypical chest pain and  palpitations.  He did have a cardiac catheterization performed in  December 1997.  He had  normal coronary arteries and his ejection  fraction was 50%.  He states he has had a chronic pain in his chest that  feels like something should be there.  There is also occasional  palpitations with this.  However, over the past 1 month, he has also had  a substernal chest pain occurs with exertion and is relieved with rest.  He describes this as a sharp pain and radiates to his neck and arms  bilaterally.  There is no associated shortness of breath, diaphoresis,  but there is minimal nausea.  He had his last episode yesterday while  walking and his symptoms are progressively worsening by his report.  These appear to be different than his chronic symptoms.  Note, he also  has dyspnea with these episodes of exertion.  There is no orthopnea,  PND, pedal edema, palpitations, presyncope, or syncope.  Because of the  above, the patient is being admitted.   MEDICATIONS:  Atenolol of unknown dose.  Prilosec vitamin D, Osteo Bi-  Flex, Aleve, and Citrucel.   ALLERGIES:  He has allergies to HYDROCODONE, SUDAFED, and CYMBALTA.   SOCIAL HISTORY:  He has remote history of tobacco use, but has not  smoked in 40 years.  He does not consume alcohol.   FAMILY HISTORY:  Positive for coronary disease in his brother, sister,  and his father.   PAST  MEDICAL HISTORY:  There is no hypertension, hyperlipidemia.  There  is diet-controlled diabetes mellitus.  He does have a history of kidney  infection by his report.  He has had prior knee and bladder surgery per  old notes.  She also had surgery at an early age for a tractor accident.   REVIEW OF SYSTEMS:  He denies any headaches or fevers or chills.  There  is no productive cough or hemoptysis.  There is no dysphagia,  odynophagia, melena, or hematochezia.  There is no dysuria or hematuria.  No rashes or seizure activity.  There is orthopnea, PND, or pedal edema.  The remaining systems are negative.   PHYSICAL EXAMINATION:  Today, shows a blood pressure of 146/63 and his  pulse is 48.  He weighs 177 pounds.  He is well-developed, well-nourished in no acute distress.  SKIN:  Warm, dry.  He does not appear to be depressed.  There is no  peripheral clubbing.  BACK:  Normal.  HEENT: Normal.  Normal  eyelids.  NECK:  Supple with normal upstroke bilaterally.  No bruits noted.  There  is no jugular venous distention.  I cannot appreciate thyromegaly.  CHEST:  Clear to auscultation.  No expansion.  CARDIOVASCULAR:  Bradycardic rate but regular rhythm.  There are no  murmurs, rubs, or gallops noted.  Note, his heart sounds are distant.  ABDOMEN:  Nontender, nondistended.  Positive bowel sounds.  No  hepatosplenomegaly and no masses appreciated.  There is no abdominal  bruit.  There is a question of a pulsatile mass on examination.  He has  2+ femoral pulses bilaterally.  No bruits.  EXTREMITIES:  No edema and I can palpate no cords.  He has 2+ dorsalis  pedis pulses bilaterally.  There are some varicosities noted.  NEUROLOGIC:  Grossly intact.   An electrocardiogram today shows a marked sinus bradycardia at a rate of  48.  The axis is normal.  There are no ST changes noted.   DIAGNOSES:  1. Unstable angina - Mr. Waid is complaining of two types of chest      pain.  One appears to be  chronic.  However, the other is new in      onset over the past month and is classic for angina.  His symptoms      were also worsening.  We will plan to admit to [facility]      and proceed with cardiac catheterization.  The risks and benefits      have been discussed and he agrees to proceed.  I will also cycle      enzymes.  I will treat with aspirin and we will continue with low-      dose atenolol and I will add intravenous heparin.  If coronary      disease is demonstrated, then we will add a statin as well.  2. Chest pain - He also has a chronic pain that may be GI in etiology.      We will continue his Prilosec.  3. Possible pulsatile mass on examination - He will need an ultrasound      of his abdomen to exclude aneurysm.  We will arrange this as an      outpatient after his cardiac catheterization.  4. Diet-controlled diabetes mellitus - We will check CBGs.      Madolyn Frieze Jens Som, MD, [facility]  Electronically Signed     BSC/MEDQ  D:  11/28/2007  T:  11/29/2007  Job:  [id number]

## 2010-09-14 NOTE — Assessment & Plan Note (Signed)
OFFICE VISIT   [name], [name]  DOB:  [birth year]                                       07/01/2008  ZOXWR#:[id number]   I saw [name] in the office today for continued follow-up of his  iliac artery aneurysms.  I had originally seen him in consultation in  September of last year at which time the distal aorta measured 2.8 cm in  maximum diameter.  The right common iliac artery measured 2.2 cm in  maximum diameter and the left common iliac artery measured 2.8 cm.  I  explained that we generally would not consider elective repair with  iliac aneurysm unless it was 3.5 cm in maximum diameter or was  symptomatic.  I set him up for a 45-month CT scan which was done at the  [facility].   Since I saw him last he has had no significant changes in medical  history, he is very careful with his nutrition and he is not a smoker.  He does have chronic low back pain, but has had no abdominal pain.   REVIEW OF SYSTEMS:  He has had no recent chest pain, chest pressure,  palpitations or arrhythmias.  He has had no bronchitis, asthma or  wheezing.   PHYSICAL EXAMINATION:  GENERAL:  This is a pleasant 77 year old  gentleman who appears his stated age.  VITAL SIGNS:  His blood pressure 121/64, heart rate is 57.  NECK:  Supple.  There is no cervical lymphadenopathy.  I do not detect  any carotid bruits.  LUNGS:  Clear bilaterally to auscultation.  ABDOMEN:  Abdominal aorta is easily palpable and nontender.  EXTREMITIES:  He has palpable femoral, popliteal and pedal pulses  bilaterally.  No evidence of atheroembolic disease.  No significant  lower extremity swelling.   I did review his CT scan and by my measurement the maximum diameter of  his distal aorta is approximately 2.8 cm.  The right common iliac artery  measures approximately 2 cm in maximal diameter and the left common  iliac approximately 2.2 cm in maximum diameter.  Thus clearly there has  been no  significant change in size of his iliac artery aneurysms.   Again I explained that we generally do not consider elective repair  unless the aneurysms reach 3.5 cm in maximum diameter.  I plan on seeing  him back in 6 months with an ultrasound.  I think we may be able to  follow the iliac artery aneurysms with duplex as he is quite thin.  We  will also check his popliteal arteries at that time as there is a small  association of popliteal artery aneurysm with abdominal aneurysms.  I  will see him back in 6 months.  He knows to call sooner if he has  problems.   [name]. Edilia Bo, M.D.  Electronically Signed   CSD/MEDQ  D:  07/01/2008  T:  07/02/2008  Job:  [id number]   cc:   Madolyn Frieze. Jens Som, MD, [facility]  Lucky Cowboy, M.D.

## 2010-09-14 NOTE — Consult Note (Signed)
VASCULAR SURGERY CONSULTATION   [name], [name]  DOB:  1933/09/18                                       01/15/2008  NFAOZ#:[id number]   I saw the patient in the office today in consultation concerning  bilateral common iliac artery aneurysms.  He was referred by Dr.  Jens Som.  This is a pleasant 77 year old gentleman who was noted to  have an abdominal bruit.  This prompted an ultrasound which showed some  slight ectasia of the aorta but no aneurysmal disease.  However, he had  bilateral common iliac artery aneurysms.  He was referred for further  vascular evaluation.  Of note, he does have a family history of  aneurysmal disease and states that he had a brother who had a femoral  aneurysm and also has a second brother who has had an aneurysm.  He had  no significant abdominal pain.  He does have a history of chronic low  back pain.   PAST MEDICAL HISTORY:  Significant for coronary artery disease.  He  underwent a PTCA Dr. Riley Kill in July 2009 and has been on Plavix.  Had  placement of a drug-eluting stent.  Past medical history is otherwise  significant for adult onset diabetes.  He does not require insulin.  In  addition he has hypercholesterolemia which was fairly easily controlled  with simvastatin 20 mg a day.  He denies any history of previous  myocardial infarction, history congestive heart failure or history of  COPD.   FAMILY HISTORY:  His father died with an MI at age 21.  He had a brother  who died with an MI at 29 and a sister who died of an MI at 61.  He has  a strong family history of premature cardiovascular disease.   SOCIAL HISTORY:  He is married.  He has 4 children.  Quit tobacco in  1969.   REVIEW OF SYSTEMS AND MEDICATIONS:  Documented on the medical history  form in his chart.   PHYSICAL EXAMINATION:  This is a pleasant 77 year old gentleman who  appears his stated age.  His blood pressure is 142/70, heart rate is 68.  Neck is  supple.  There is no cervical lymphadenopathy.  I do not detect  any carotid bruits.  Lungs:  Clear bilaterally to auscultation.  Cardiac  exam he has a regular rate and rhythm.  Abdomen:  Soft, nontender.  His  aorta is easily palpable.  He has palpable femoral popliteal and pedal  pulses bilaterally.  Both feet are warm well perfused.  No ischemic  ulcers.  He has no significant lower extremity swelling.   His duplex study done at the [facility] office shows the maximum diameter  of his infrarenal aorta was 2.8 cm distally.  The maximum diameter of  the right common iliac artery was 2.2 cm.  Maximum diameter the left  common iliac artery was 2.8 cm.   I have explained that we generally would not consider elective repair of  iliac aneurysm unless it reached 2.5 cm maximum diameter.  I have  recommend followup CT scan in 6 months.  We can try to arrange this at  the [facility] office.  I think the CT will help judge the size of his  native arteries as he may have arteriomegaly.  His popliteal pulses and  femoral pulses are  fairly prominent.  Given his strong family history of  aneurysmal disease, I think this needs to be followed closely.  I will  plan on seeing him back in 6 months.  He knows to call sooner if he has  problems.   [name]. Edilia Bo, M.D.  Electronically Signed  CSD/MEDQ  D:  01/15/2008  T:  01/16/2008  Job:  [id number]   cc:   Madolyn Frieze. Jens Som, MD, [facility]  Arturo Morton. Riley Kill, MD, [facility]  Lucky Cowboy, M.D.

## 2010-09-14 NOTE — Procedures (Signed)
VASCULAR LAB EXAM   INDICATION:  Known iliac artery aneurysms, rule out popliteal artery  aneurysms.   HISTORY:  Diabetes:  Yes.  Cardiac:  Stent.  Hypertension:  No.   EXAM:  Bilateral popliteal artery duplex.   IMPRESSION:  1. Patent bilateral popliteal arteries with no evidence of aneurysm.  2. Popliteal measurements:  R:  P=1.0 cm x 1.01 cm, M=0.98 cm x 1.05      cm, D=0.95 cm x 1.02 cm.  L:  P=0.94 cm x 1.09 cm, M=1.0 cm x 1.02      cm, D=1.10 cm x 1.0 cm.   ___________________________________________  [name]. Edilia Bo, M.D.   AS/MEDQ  D:  01/29/2009  T:  01/29/2009  Job:  [id number]

## 2010-09-14 NOTE — Assessment & Plan Note (Signed)
[facility]                            CARDIOLOGY OFFICE NOTE   SHEEHAN, STACEY                    MRN:          [id number]  DATE:06/19/2008                            DOB:          November 08, 1933    [name] is a pleasant 77 year old gentleman who has a history of  coronary artery disease.  He had prior PCI of his LAD with a drug-  eluting stent in July 2009.  He also has bilateral common iliac artery  aneurysms and this now being followed by Vascular Surgery.  Since I last  saw him he is doing well symptomatically.  There is no dyspnea, chest  pain, palpitations, or syncope.  There is no pedal edema.   MEDICATIONS:  1. Prilosec.  2. Vitamin D.  3. Citrucel.  4. Plavix 75 mg p.o. daily.  5. Atenolol 12.5 mg p.o. daily.  6. Aspirin 81 mg p.o. daily.  7. Zocor 20 mg p.o. daily.   PHYSICAL EXAMINATION:  VITAL SIGNS:  Blood pressure of 158/73, his pulse  is 61.  Weight is 179 pounds.  HEENT:  Normal.  NECK:  Supple with no bruits.  CHEST:  Clear.  CARDIOVASCULAR:  Regular rate and rhythm.  ABDOMEN:  No tenderness.  EXTREMITIES:  No edema.   His electrocardiogram shows a sinus rhythm at a rate of 62.  There is a  first-degree AV block.  There are no ST changes noted.   DIAGNOSES:  1. Coronary artery disease, status post drug-eluting stent to the left      anterior descending - the patient is doing well symptomatically      with no chest pain or shortness of breath.  We will continue his      aspirin, Plavix, beta-blocker, and statin.  2. Hypertension - the patient's blood pressure is elevated.  I am      adding lisinopril 10 mg p.o. daily.  We will check a BMET 1 week.      Follow his potassium and renal function.  3. Hyperlipidemia - he will continue on Zocor.  This is being followed      by his primary care physician.  4. History of iliac aneurysms - this is being followed by Vascular      Surgery.  5. Gastroesophageal reflux  disease.  6. Diabetes mellitus.  7. History of irritable bowel.   I will see him back in 9 months.     Madolyn Frieze Jens Som, MD, [facility]  Electronically Signed    BSC/MedQ  DD: 06/19/2008  DT: 06/19/2008  Job #: [id number]

## 2010-09-14 NOTE — Assessment & Plan Note (Signed)
[facility]                            CARDIOLOGY OFFICE NOTE   [name], [name]                    MRN:          [id number]  DATE:12/24/2007                            DOB:          [birth year]    [name] is a very pleasant 77 year old gentleman who I recently  admitted to [facility] from the office on November 28, 2007.  He  had symptoms consistent with unstable angina.  He underwent a cardiac  catheterization by Dr. Riley Kill.  He had a 95% proximal LAD and he had  PTCA/drug-eluting stent successfully.  His ejection fraction was normal.  Note, he did not have obstructive disease in his other vessels.  During  that admission, he did have initiation of a statin.  It was also noted  that he had an abdominal bruit and he underwent abdominal ultrasound on  December 18, 2007.  This showed a dilated distal abdominal aorta but  technically not aneurysmal.  He also had bilateral common iliac arteries  that were aneurysmal.  This measured 2.2 cm x 2.1 on the right and 2.3 x  2.8 on the left.  Since discharge, he has done well.  He denies any  dyspnea, chest pain, palpitations, or syncope.  There is no pedal edema.   MEDICATIONS:  1. Prilosec.  2. Vitamin D.  3. Osteo Bi-Flex.  4. Citrucel.  5. Plavix 75 mg p.o. daily.  6. Aspirin 81 mg p.o. daily.  7. Atenolol 12.5 mg p.o. daily.  8. Zocor 20 mg p.o. daily.   PHYSICAL EXAMINATION:  VITAL SIGNS:  Today shows a blood pressure of  120/70.  His pulse is 70.  He weighs 176 pounds.  HEENT:  Normal.  NECK:  Supple with no bruits.  CHEST:  Clear.  CARDIOVASCULAR:  Regular rate and rhythm.  ABDOMEN:  No tenderness.  His right groin shows no hematoma, no bruit.  EXTREMITIES:  No edema.   DIAGNOSES:  1. Coronary artery disease, status post drug-eluting stent to the LAD      - the patient has not had a recurrent exertional chest pain or      dyspnea.  He will continue with his aspirin, Plavix, beta  blocker      and statin.  He also will continue with diet and exercise.  Note,      he does not smoke.  2. Iliac aneurysm - On the left, his iliac is approaching 2.8 cm.  We      will refer him to vascular surgery as this may require surgical      repair in the future.  3. Hyperlipidemia - He will continue on Zocor.  We will check lipids      and liver and adjust as indicated.  4. History of mild anemia - We will plan to repeat his CBC as well as      a BMET when he returns for his lipids and liver.  5. Gastroesophageal reflux disease - He will continue on his Prilosec.  6. History of diabetes mellitus.  7. History of irritable bowel syndrome.   He  will see me back in 6 months or sooner if necessary.     Dennis Frieze Jens Som, MD, [facility]  Electronically Signed    BSC/MedQ  DD: 12/24/2007  DT: 12/25/2007  Job #: [id number]

## 2010-09-14 NOTE — Discharge Summary (Signed)
NAME:  [name], [name] NO.:  [id number]   MEDICAL RECORD NO.:  [id number]          PATIENT TYPE:  INP   LOCATION:  2629                         FACILITY:  [facility]   PHYSICIAN:  Arturo Morton. Riley Kill, MD, FACCDATE OF BIRTH:  01-Mar-1934   DATE OF ADMISSION:  11/28/2007  DATE OF DISCHARGE:  11/29/2007                               DISCHARGE SUMMARY   PROCEDURES:  1. Cardiac catheterization.  2. Coronary arteriogram.  3. Left ventriculogram.  4. Percutaneous transluminal coronary angioplasty with a drug-eluting      stent under the platinum study.   PRIMARY FINAL DISCHARGE DIAGNOSIS:  Acute coronary syndrome.   SECONDARY DIAGNOSES:  1. Diabetes with a hemoglobin A1c of 5.5.  2. Dyslipidemia with a total cholesterol of 123, triglycerides 87, HDL      28, and LDL 78 this admission, low-dose statin for coronary artery      disease.  3. Hypotension/bradycardia.  4. Allergy or intolerance to KEFLEX, CYMBALTA, PSEUDOEPHEDRINE,      VITAMIN D, HYDROCODONE, and HIGH-DOSE GABAPENTIN.  5. Irritable bowel syndrome.  6. Benign prostatic hypertrophy.  7. History of mononeuritis.  8. Family history of coronary artery disease.   TIME OF DISCHARGE:  37 minutes.   HOSPITAL COURSE:  [name] is a 77 year old male with no previous  history of coronary artery disease.  He saw his family physician and his  symptoms were concerning for unstable anginal pain.  He was referred  immediately to Cardiology.  He was admitted by Dr. Jens Som and  scheduled for catheterization.   [name] was enrolled in the platinum study.  The cardiac  catheterization showed a 95% proximal LAD, which was treated with PTCA  and a drug-eluting stent reducing the stenosis to 10%.  His EF was  normal.  He had 30-40% lesions in the distal LAD, circumflex, RCA, and  PDA/PLA.  Medical therapy is recommended for these.   On November 29, 2007, [name] was ambulating without chest pain or  shortness of  breath.  He was seen by cardiac rehab and educated on  cardiac risk factor reduction using nitroglycerin and calling 911 among  other things.  He was noted to have heart rates dropping into the 40s at  times and his systolic blood pressure was in the 90s, so his beta-  blocker dose was decreased.  Because of the coronary artery disease, a  statin is added to his medication regimen in order to get his LDL less  than 70.  He is encouraged to pursue outpatient cardiac rehab and note  that activity will help increase his HDL.  His potassium was  supplemented.  He was noted to be anemic and his hemoglobin had gone  from 12.1 to 10.8 postprocedure and with hydration.  His MCV is within  normal limits.  He has no history of melena and no symptoms of abdominal  tenderness.  He is to follow up with his primary care physician and get  repeat CBC and BMET next week.  Dr. Jens Som detected a possible  pulsatile mass in his abdomen and an ultrasound is scheduled.  This is  asymptomatic.  [name] was evaluated by Dr. Riley Kill on November 29, 2007, and considered stable for discharge with close outpatient  followup.   DISCHARGE INSTRUCTIONS:  His activity level is to be increased  gradually.  He is not to do any heavy lifting until after he follows up  with Dr. Jens Som, which will be after the ultrasound.  He is to get an  abdominal ultrasound on December 18, 2007, at 9:00 a.m..  He is to follow  up with Dr. Jens Som on December 24, 2007, at 3:45.  He is to follow up  with Dr. Linus Mako as needed.   DISCHARGE MEDICATIONS:  1. Atenolol 25 mg one-half tablet daily, hold today.  2. Prilosec OTC 20 mg daily.  3. 2000 units of vitamin C daily.  4. Aleve 2 tablets daily.  5. Osteo Bi-Flex 2 tablets daily.  6. Citracal daily.  7. Aspirin 325 mg daily.  8. Plavix 75 mg daily.  9. Simvastatin 20 mg daily.      Theodore Demark, PA-C      Arturo Morton. Riley Kill, MD, [facility]  Electronically Signed    RB/MEDQ   D:  11/29/2007  T:  11/29/2007  Job:  [id number]   cc:   Dr. Linus Mako

## 2010-09-14 NOTE — Assessment & Plan Note (Signed)
OFFICE VISIT   [name], [name]  DOB:  02/18/1934                                       01/29/2009  JYNWG#:[id number]   I saw the patient in the office today for continued followup of his  aneurysmal disease.  He has a small abdominal aortic aneurysm and  bilateral common iliac artery aneurysms.  I last saw him in March and he  came in for a 6 month followup visit.  Of note, he had had a recent  ultrasound done at Dr. Ludwig Clarks office which showed that the maximum  diameter of his infrarenal abdominal aortic aneurysm was 2.6 cm.  His  right common iliac artery measured 2.2 cm in maximum diameter and the  left common iliac 2.4 cm in maximum diameter.  There has been no change  in the size of his aneurysms over the last 6 months.  In addition in our  office today he has a popliteal artery duplex to rule out popliteal  artery aneurysms given the known association with abdominal aneurysms  and popliteal artery aneurysms.  This showed no evidence of popliteal  artery aneurysms.   Since I saw him last he has had no significant abdominal or back pain.  There has been no significant change in his medical history.  He does  have a history of diabetes and has undergone previous coronary stent  placement in September of 2009.   SOCIAL HISTORY:  He is married.  He has four children.  He does not  smoke cigarettes.   REVIEW OF SYSTEMS:  He has had no recent chest pain, chest pressure,  palpitations or arrhythmias.  He has had no significant shortness of  breath with exertion.  He has had no leg pain.  Review of systems is  otherwise only significant for some occasional joint pain.   PHYSICAL EXAMINATION:  This is a pleasant 77 year old gentleman who  appears his stated age.  Blood pressure is 128/63, heart rate is 56.  Neck is supple.  I do not detect any carotid bruits.  Lungs are clear  bilaterally to auscultation.  On cardiac exam he has a regular rate and  rhythm.  His abdomen is soft and nontender.  His aneurysm is palpable  and nontender.  He is quite thin.  He has palpable femoral pulses and  palpable pedal pulses bilaterally.   Given that there has been no change in the size of his aneurysm I have  again explained we generally would not consider elective repair of an  infrarenal aneurysm unless it measured 5.5 cm in maximum diameter.  This  is based on American Heart Association recommendations.  With respect to  the iliac artery aneurysms generally we would consider elective repair  at about 3.5 cm in a normal risk patient.  As Dr. Jens Som has been  following these aneurysms in his office I think we will simply have him  to continue to do his followup there and if the aneurysms enlarge we  will be happy to see him back at any time.  He knows to call if he has  any new vascular issues.   [name]. Edilia Bo, M.D.  Electronically Signed   CSD/MEDQ  D:  01/29/2009  T:  01/30/2009  Job:  [id number]   cc:   Lucky Cowboy, M.D.  Madolyn Frieze Jens Som, MD, [facility]

## 2010-09-17 NOTE — Cardiovascular Report (Signed)
NAME:  ELIGE, SHOUSE NO.:  [id number]   MEDICAL RECORD NO.:  [id number]          PATIENT TYPE:  INP   LOCATION:  2629                         FACILITY:  [facility]   PHYSICIAN:  Arturo Morton. Riley Kill, MD, FACCDATE OF BIRTH:  11/24/1933   DATE OF PROCEDURE:  02/07/2008  DATE OF DISCHARGE:  11/29/2007                            CARDIAC CATHETERIZATION   INDICATIONS:  The patient is a 77 year old gentleman with class II to  III angina pectoris, study was done to assess coronary anatomy.   PROCEDURES:  1. Left heart catheterization.  2. Selective coronary arteriography.  3. Selective left ventriculography.  4. Percutaneous stenting of the left anterior descending artery in the      platinum study using a Platinum study stent.   DESCRIPTION OF THE PROCEDURE:  The patient was brought to the  Catheterization Laboratory and prepped and draped in the usual fashion.  Through an anterior puncture, the femoral artery was entered.  The  diagnostic catheterization was performed with standard Judkins  catheters.  Following this, the patient was noted to have a critical  stenosis in the left anterior descending artery.  He had been consented  to be enrolled in the platinum study and he was agreeable to the  investigational protocol.  The patient has had received appropriate  aspirin and clopidogrel.  Bivalirudin was used according to protocol.  A  Judkins left guiding catheter was utilized.  The lesion was crossed with  a soft wire and stented with a 3.0 x 20 Platinum study stent.  Post-  dilatation was done with a 3.25 [place] Voyager balloon.  Stenosis was  reduced from 95 to less than 10% residual luminal narrowing.  He  tolerated the procedure well.  All catheters were subsequently removed  and the femoral sheath was sewn into place.   HEMODYNAMIC DATA:  1. The central aortic pressure is 138/60, mean 90.  2. Left ventricular pressure 131/13.  3. There was no gradient on pullback  across the aortic valve.   ANGIOGRAPHIC DATA:  1. Left main coronary artery is free of critical disease.  2. The LAD courses to the apex.  The LAD has a high-grade stenosis      overlapping the septal and small first diagonal that is 95% luminal      reduction.  Following the takeoff, the diagonal was about 30-40%      narrowing in the mid LAD.  The stenosis proximally was reduced from      95 down to less than 10% and residual luminal narrowing.  3. The circumflex has about 40% proximal stenosis.  After this, there      is about 30-40% narrowing in a small marginal branch and 30-40%      narrowing distally.  The second marginal in the AV circumflex is      free of critical disease.  The right coronary artery is a moderate      size vessel.  There is diffuse segmental plaquing of 20-30% in the      midvessel.  There is a posterior descending and 3 posterolateral  branches all with minimal luminal irregularity, but no critical      stenosis.  4. The ventriculogram demonstrates vigorous global systolic function      without a definite wall motion abnormality.   CONCLUSIONS:  1. Scattered coronary irregularities with a high-grade stenosis of the      mid left anterior descending artery with successful percutaneous      stenting using a Platinum study stent.  2. Well-preserved left ventricular function.   DISPOSITION:  The patient will be treated with aspirin and Plavix.  Continued followup with Cardiology will be required.  Aspirin and Plavix  for a minimum of 1 year will be required followed by aspirin thereafter  and Plavix at the discretion of the cardiologist.      Arturo Morton. Riley Kill, MD, [facility]  Electronically Signed     TDS/MEDQ  D:  02/07/2008  T:  02/07/2008  Job:  [id number]

## 2010-09-29 ENCOUNTER — Encounter: Payer: Self-pay | Admitting: Internal Medicine

## 2010-10-06 ENCOUNTER — Encounter: Payer: Self-pay | Admitting: Cardiology

## 2010-10-15 ENCOUNTER — Other Ambulatory Visit: Payer: Self-pay | Admitting: Cardiology

## 2010-11-15 ENCOUNTER — Ambulatory Visit (INDEPENDENT_AMBULATORY_CARE_PROVIDER_SITE_OTHER): Payer: 59 | Admitting: Internal Medicine

## 2010-11-15 ENCOUNTER — Encounter: Payer: Self-pay | Admitting: Internal Medicine

## 2010-11-15 VITALS — BP 128/64 | HR 60 | Ht 73.0 in | Wt 180.0 lb

## 2010-11-15 DIAGNOSIS — K5900 Constipation, unspecified: Secondary | ICD-10-CM

## 2010-11-15 DIAGNOSIS — Z860100 Personal history of colon polyps, unspecified: Secondary | ICD-10-CM

## 2010-11-15 DIAGNOSIS — I2510 Atherosclerotic heart disease of native coronary artery without angina pectoris: Secondary | ICD-10-CM

## 2010-11-15 DIAGNOSIS — Z800 Family history of malignant neoplasm of digestive organs: Secondary | ICD-10-CM

## 2010-11-15 MED ORDER — PEG-KCL-NACL-NASULF-NA ASC-C 100 G PO SOLR: 1.0000 | Freq: Once | ORAL | Status: DC

## 2010-11-15 NOTE — Progress Notes (Signed)
HISTORY OF PRESENT ILLNESS:  [name] is a 77 y.o. male with coronary artery disease for which he has undergone coronary artery stent placement on 2 prior occasions, most recently December 2011. He is on chronic Plavix. He has been stable from a cardiac standpoint. He also has a history of adenomatous colon polyps and has undergone multiple prior colonoscopies with Dr. Victorino Dike. Most recent colonoscopy in November 2004 with a diminutive colon polyp removed (no pathology available). Followup in 5 years recommended. He did receive a recall letter at the appropriate time. Since that time, his sister was diagnosed with metastatic colon cancer around age 17. His primary provider stressed the importance of his followup colonoscopy. The patient's GI review of systems is remarkable for chronic constipation. He take Citrucel with good results. He also has a history of GERD which is well managed with omeprazole.  REVIEW OF SYSTEMS:  All non-GI ROS negative except for sinus and allergy trouble, back pain, headaches, muscle cramps.  Past Medical History  Diagnosis Date  . Coronary atherosclerosis of unspecified type of vessel, native or graft   . Aneurysm of iliac artery   . Personal history of other diseases of digestive system   . Esophageal reflux   . Other and unspecified hyperlipidemia   . Colon polyps   . Gastritis, chronic   . Diabetes mellitus     Diet control   . IBS (irritable bowel syndrome)     Past Surgical History  Procedure Date  . Knee surgery   . Bladder surgery   . Coronary angioplasty with stent placement     Social History [name]  reports that he has quit smoking. He has never used smokeless tobacco. He reports that he does not drink alcohol or use illicit drugs.  family history includes Colon cancer in his sister; Colon polyps in his sister; Diabetes in his maternal grandmother; Heart attack in his brother, father, and sister; and Liver cancer in his  sister.  No Known Allergies     PHYSICAL EXAMINATION: Vital signs: BP 128/64  Pulse 60  Ht 6\' 1"  (1.854 m)  Wt 180 lb (81.647 kg)  BMI 23.75 kg/m2  Constitutional: generally well-appearing, no acute distress Psychiatric: alert and oriented x3, cooperative Eyes: extraocular movements intact, anicteric, conjunctiva pink Mouth: oral pharynx moist, no lesions Neck: supple no lymphadenopathy Cardiovascular: heart regular rate and rhythm, no murmur Lungs: clear to auscultation bilaterally Abdomen: soft, nontender, nondistended, no obvious ascites, no peritoneal signs, normal bowel sounds, no organomegaly Rectal: Deferred until colonoscopy Extremities: no lower extremity edema bilaterally Skin: no lesions on visible extremities Neuro: No focal deficits.    ASSESSMENT:  #1. Personal history of colon polyps. Last exam 2004 without available pathology #2. Family history of colon cancer in his sister at age 37 #3. Chronic constipation #4. GERD #5. Coronary artery disease with coronary artery stent placement as recent as December 2011. On aspirin and Plavix   PLAN:  #1. Colonoscopy.The nature of the procedure, as well as the risks, benefits, and alternatives were carefully and thoroughly reviewed with the patient. Ample time for discussion and questions allowed. The patient understood, was satisfied, and agreed to proceed. We will perform the examination on Plavix and aspirin. The prep prescribed was Movi prep. Patient instructed on its use #2. Continue fiber for constipation #3. Continue PPI and reflux precautions for GERD

## 2010-11-15 NOTE — Patient Instructions (Signed)
Colonoscopy [facility] 11/24/10 11:00 am arrive at 10:00 am on 4th floor Stay on Plavix per Dr. Darcel Smalling prescription has been sent to your pharmacy for you to pick up. Colonoscopy brochure given for you to review.

## 2010-11-16 ENCOUNTER — Encounter: Payer: Self-pay | Admitting: Internal Medicine

## 2010-11-24 ENCOUNTER — Ambulatory Visit (AMBULATORY_SURGERY_CENTER): Payer: 59 | Admitting: Internal Medicine

## 2010-11-24 ENCOUNTER — Encounter: Payer: Self-pay | Admitting: Internal Medicine

## 2010-11-24 VITALS — BP 144/75 | HR 53 | Temp 97.5°F | Resp 20 | Ht 72.0 in | Wt 175.0 lb

## 2010-11-24 DIAGNOSIS — K5900 Constipation, unspecified: Secondary | ICD-10-CM

## 2010-11-24 DIAGNOSIS — D126 Benign neoplasm of colon, unspecified: Secondary | ICD-10-CM

## 2010-11-24 DIAGNOSIS — Z1211 Encounter for screening for malignant neoplasm of colon: Secondary | ICD-10-CM

## 2010-11-24 DIAGNOSIS — Z860100 Personal history of colon polyps, unspecified: Secondary | ICD-10-CM

## 2010-11-24 DIAGNOSIS — Z800 Family history of malignant neoplasm of digestive organs: Secondary | ICD-10-CM

## 2010-11-24 MED ORDER — SODIUM CHLORIDE 0.9 % IV SOLN: 500.0000 mL | INTRAVENOUS | Status: DC

## 2010-11-24 NOTE — Patient Instructions (Signed)
Follow discharge instructions.  Continue your medications.

## 2010-11-24 NOTE — Progress Notes (Signed)
While advancing scope to cecum, pt had vagal response, heart rate decreased to 33, bp was 86/69, md aware, pt stimulated sand responded well. IVF wide open, bp retaken at 102/59, ivf remained open to gravity,skin warm and dry. MD removed air from colon, heart rate increased to 37, pt stimulated and again responded well. Reck BP 139/62, heart rate 43. md aware of all above.  Heart rate up to 48-51 after reached cecum, bp stable at 126/76, skin remains warm and dry.  E Fontella Shan RN

## 2010-11-25 ENCOUNTER — Telehealth: Payer: Self-pay | Admitting: *Deleted

## 2010-11-25 NOTE — Telephone Encounter (Signed)

## 2010-12-23 ENCOUNTER — Encounter: Payer: Self-pay | Admitting: Cardiology

## 2011-01-28 LAB — CBC
HCT: 31.4 — ABNORMAL LOW
HCT: 35.9 — ABNORMAL LOW
Hemoglobin: 10.8 — ABNORMAL LOW
Hemoglobin: 12.1 — ABNORMAL LOW
MCHC: 33.8
MCHC: 34.4
MCV: 96.2
MCV: 97.7
Platelets: 168
Platelets: 194
RBC: 3.26 — ABNORMAL LOW
RBC: 3.68 — ABNORMAL LOW
RDW: 13.5
RDW: 13.9
WBC: 5.3
WBC: 6.3

## 2011-01-28 LAB — BASIC METABOLIC PANEL
BUN: 15
CO2: 27
Calcium: 8 — ABNORMAL LOW
Chloride: 106
Creatinine, Ser: 0.92
GFR calc Af Amer: >60
GFR calc non Af Amer: >60
Glucose, Bld: 140 — ABNORMAL HIGH
Potassium: 3.3 — ABNORMAL LOW
Sodium: 138

## 2011-01-28 LAB — CARDIAC PANEL(CRET KIN+CKTOT+MB+TROPI)
CK, MB: 2.2
CK, MB: 2.3
CK, MB: 2.9
Relative Index: INVALID
Relative Index: INVALID
Relative Index: INVALID
Total CK: 67
Total CK: 72
Total CK: 96
Troponin I: 0.01
Troponin I: 0.01
Troponin I: <0.01

## 2011-01-28 LAB — COMPREHENSIVE METABOLIC PANEL
ALT: 14
AST: 22
Albumin: 3.6
Alkaline Phosphatase: 67
BUN: 17
CO2: 29
Calcium: 8.9
Chloride: 106
Creatinine, Ser: 0.93
GFR calc Af Amer: >60
GFR calc non Af Amer: >60
Glucose, Bld: 88
Potassium: 4
Sodium: 140
Total Bilirubin: 1.1
Total Protein: 6

## 2011-01-28 LAB — LIPID PANEL
Cholesterol: 123
HDL: 28 — ABNORMAL LOW
LDL Cholesterol: 78
Total CHOL/HDL Ratio: 4.4
Triglycerides: 87
VLDL: 17

## 2011-01-28 LAB — HEMOGLOBIN A1C
Hgb A1c MFr Bld: 5.5
Mean Plasma Glucose: 119

## 2011-01-28 LAB — PROTIME-INR
INR: 1.1
Prothrombin Time: 14.3

## 2011-01-28 LAB — APTT: aPTT: 35

## 2011-01-28 LAB — TSH: TSH: 1.559

## 2011-01-28 LAB — HEPARIN LEVEL (UNFRACTIONATED): Heparin Unfractionated: <0.1 — ABNORMAL LOW

## 2011-01-28 LAB — HDL CHOLESTEROL: HDL: 34 — ABNORMAL LOW

## 2011-02-09 ENCOUNTER — Other Ambulatory Visit: Payer: Self-pay | Admitting: Cardiology

## 2011-02-09 DIAGNOSIS — I714 Abdominal aortic aneurysm, without rupture: Secondary | ICD-10-CM

## 2011-02-10 ENCOUNTER — Encounter (INDEPENDENT_AMBULATORY_CARE_PROVIDER_SITE_OTHER): Payer: Medicare Other | Admitting: Cardiology

## 2011-02-10 DIAGNOSIS — I714 Abdominal aortic aneurysm, without rupture: Secondary | ICD-10-CM

## 2011-05-05 ENCOUNTER — Encounter: Payer: Self-pay | Admitting: Cardiology

## 2011-05-05 ENCOUNTER — Ambulatory Visit (INDEPENDENT_AMBULATORY_CARE_PROVIDER_SITE_OTHER): Payer: Medicare Other | Admitting: Cardiology

## 2011-05-05 DIAGNOSIS — I2510 Atherosclerotic heart disease of native coronary artery without angina pectoris: Secondary | ICD-10-CM

## 2011-05-05 DIAGNOSIS — E785 Hyperlipidemia, unspecified: Secondary | ICD-10-CM

## 2011-05-05 DIAGNOSIS — I723 Aneurysm of iliac artery: Secondary | ICD-10-CM

## 2011-05-05 NOTE — Progress Notes (Signed)
HPI:[name] is a pleasant  gentleman who has a history of coronary artery disease.  He had prior PCI of his LAD with a drug- eluting stent in July 2009.  He also has bilateral common iliac artery aneurysms. Last abdominal and iliac ultrasound was performed in Oct 2012. There was an ectatic aorta. The iliac aneurysms measured 2.5 cm on the left and 2.4 cm on the right. Admitted in December of 2011 with chest pain. He had cardiac catheterization which revealed  preserved LV function. Normal LM. 30% mid LAD;  40-50% distal LAD; subbranch of D3 70%; Lcx about 50% proximal lesion before the first small marginal. The right coronary artery has a high-grade stenosis of 95% in the midportion. Pt had PCI of RCA with DES. Since I last saw him in Jan 2012, the patient denies any dyspnea on exertion, orthopnea, PND, pedal edema, palpitations, syncope or chest pain.  Current Outpatient Prescriptions  Medication Sig Dispense Refill  . aspirin 325 MG tablet Take 325 mg by mouth daily.        Marland Kitchen atenolol (TENORMIN) 25 MG tablet TAKE 1/2 TABLET BY MOUTH EVERY DAY  30 tablet  6  . Cholecalciferol (VITAMIN D3) 2000 UNITS TABS Take 1 tablet by mouth daily.        . clopidogrel (PLAVIX) 75 MG tablet Take 75 mg by mouth daily.        . Cyanocobalamin (VITAMIN B-12 IJ) Inject 1 mL as directed every 30 (thirty) days.        . Flaxseed, Linseed, 1000 MG CAPS Take 1 capsule by mouth daily.        Marland Kitchen gemfibrozil (LOPID) 600 MG tablet Take 600 mg by mouth 2 (two) times daily before a meal.        . multivitamin (THERAGRAN) per tablet Take 1 tablet by mouth daily.        . nitroGLYCERIN (NITROSTAT) 0.4 MG SL tablet Place 0.4 mg under the tongue every 5 (five) minutes as needed.        Marland Kitchen omeprazole (PRILOSEC) 20 MG capsule Take 20 mg by mouth once.       . ONE TOUCH ULTRA TEST test strip        Current Facility-Administered Medications  Medication Dose Route Frequency Provider Last Rate Last Dose  . 0.9 %  sodium chloride  infusion  500 mL Intravenous Continuous Yancey Flemings, MD         Past Medical History  Diagnosis Date  . Coronary atherosclerosis of unspecified type of vessel, native or graft   . Aneurysm of iliac artery   . Personal history of other diseases of digestive system   . Esophageal reflux   . Other and unspecified hyperlipidemia   . Colon polyps   . Gastritis, chronic   . Diabetes mellitus     Diet control   . IBS (irritable bowel syndrome)     Past Surgical History  Procedure Date  . Knee surgery   . Bladder surgery   . Coronary angioplasty with stent placement     History   Social History  . Marital Status: Married    Spouse Name: N/A    Number of Children: 4   . Years of Education: N/A   Occupational History  . Retired    Social History Main Topics  . Smoking status: Former Games developer  . Smokeless tobacco: Never Used  . Alcohol Use: No  . Drug Use: No  . Sexually Active: Not on  file   Other Topics Concern  . Not on file   Social History Narrative   1 caffeine drink daily     ROS: no fevers or chills, productive cough, hemoptysis, dysphasia, odynophagia, melena, hematochezia, dysuria, hematuria, rash, seizure activity, orthopnea, PND, pedal edema, claudication. Remaining systems are negative.  Physical Exam: Well-developed well-nourished in no acute distress.  Skin is warm and dry.  HEENT is normal.  Neck is supple. No thyromegaly.  Chest is clear to auscultation with normal expansion.  Cardiovascular exam is regular rate and rhythm.  Abdominal exam nontender or distended. No masses palpated. Extremities show no edema. neuro grossly intact  ECG sinus bradycardia with first degree AV block and no ST changes.

## 2011-05-05 NOTE — Assessment & Plan Note (Addendum)
Plan repeat ultrasound in October 2013. May need vascular surgery to see if they enlarge further.

## 2011-05-05 NOTE — Assessment & Plan Note (Signed)
Continue present medications. Monitored by primary care. Intolerant to statins.

## 2011-05-05 NOTE — Patient Instructions (Signed)
Your physician wants you to follow-up in: ONE YEAR You will receive a reminder letter in the mail two months in advance. If you don't receive a letter, please call our office to schedule the follow-up appointment.   Your physician has requested that you have an abdominal aorta duplex. During this test, an ultrasound is used to evaluate the aorta. Allow 30 minutes for this exam. Do not eat after midnight the day before and avoid carbonated beverages DUE IN OCT 2013  STOP PLAVIX

## 2011-05-05 NOTE — Assessment & Plan Note (Signed)
Continue aspirin. Discontinue Plavix. Patient is intolerant to statins.

## 2012-02-12 ENCOUNTER — Other Ambulatory Visit: Payer: Self-pay | Admitting: Cardiology

## 2012-03-26 ENCOUNTER — Other Ambulatory Visit: Payer: Self-pay | Admitting: Cardiology

## 2012-03-26 DIAGNOSIS — I714 Abdominal aortic aneurysm, without rupture: Secondary | ICD-10-CM

## 2012-04-02 ENCOUNTER — Encounter (INDEPENDENT_AMBULATORY_CARE_PROVIDER_SITE_OTHER): Payer: Medicare Other

## 2012-04-02 DIAGNOSIS — I723 Aneurysm of iliac artery: Secondary | ICD-10-CM

## 2012-04-02 DIAGNOSIS — I700 Atherosclerosis of aorta: Secondary | ICD-10-CM

## 2012-04-02 DIAGNOSIS — I714 Abdominal aortic aneurysm, without rupture: Secondary | ICD-10-CM

## 2012-04-04 ENCOUNTER — Other Ambulatory Visit: Payer: Self-pay

## 2012-04-04 DIAGNOSIS — I723 Aneurysm of iliac artery: Secondary | ICD-10-CM

## 2012-12-25 ENCOUNTER — Ambulatory Visit
Admission: RE | Admit: 2012-12-25 | Discharge: 2012-12-25 | Disposition: A | Discharge status: Home / Self Care | Payer: Medicare Other | Source: Ambulatory Visit | Attending: Internal Medicine | Admitting: Internal Medicine

## 2012-12-25 ENCOUNTER — Other Ambulatory Visit: Payer: Self-pay | Admitting: Internal Medicine

## 2012-12-25 DIAGNOSIS — R0789 Other chest pain: Secondary | ICD-10-CM

## 2012-12-25 DIAGNOSIS — R1011 Right upper quadrant pain: Secondary | ICD-10-CM

## 2013-01-07 ENCOUNTER — Other Ambulatory Visit (HOSPITAL_COMMUNITY): Payer: Self-pay | Admitting: Internal Medicine

## 2013-01-07 ENCOUNTER — Ambulatory Visit (HOSPITAL_COMMUNITY)
Admission: RE | Admit: 2013-01-07 | Discharge: 2013-01-07 | Disposition: A | Discharge status: Home / Self Care | Payer: Medicare Other | Source: Ambulatory Visit | Attending: Internal Medicine | Admitting: Internal Medicine

## 2013-01-07 DIAGNOSIS — J438 Other emphysema: Secondary | ICD-10-CM | POA: Insufficient documentation

## 2013-01-07 DIAGNOSIS — R079 Chest pain, unspecified: Secondary | ICD-10-CM

## 2013-01-07 DIAGNOSIS — R072 Precordial pain: Secondary | ICD-10-CM

## 2013-03-18 ENCOUNTER — Encounter: Payer: Self-pay | Admitting: Internal Medicine

## 2013-03-18 DIAGNOSIS — E538 Deficiency of other specified B group vitamins: Secondary | ICD-10-CM | POA: Insufficient documentation

## 2013-03-18 DIAGNOSIS — E559 Vitamin D deficiency, unspecified: Secondary | ICD-10-CM | POA: Insufficient documentation

## 2013-03-18 DIAGNOSIS — I10 Essential (primary) hypertension: Secondary | ICD-10-CM | POA: Insufficient documentation

## 2013-03-19 ENCOUNTER — Encounter: Payer: Self-pay | Admitting: Physician Assistant

## 2013-03-19 ENCOUNTER — Ambulatory Visit: Payer: Medicare Other | Admitting: Physician Assistant

## 2013-03-19 VITALS — BP 110/62 | HR 56 | Temp 98.3°F | Resp 16 | Wt 193.0 lb

## 2013-03-19 DIAGNOSIS — M6281 Muscle weakness (generalized): Secondary | ICD-10-CM

## 2013-03-19 LAB — CBC WITH DIFFERENTIAL/PLATELET
Basophils Absolute: 0 10*3/uL (ref 0.0–0.1)
Basophils Relative: 0 % (ref 0–1)
Eosinophils Absolute: 0.3 10*3/uL (ref 0.0–0.7)
Eosinophils Relative: 4 % (ref 0–5)
HCT: 39.7 % (ref 39.0–52.0)
Hemoglobin: 13.4 g/dL (ref 13.0–17.0)
Lymphocytes Relative: 29 % (ref 12–46)
Lymphs Abs: 2 10*3/uL (ref 0.7–4.0)
MCH: 32.3 pg (ref 26.0–34.0)
MCHC: 33.8 g/dL (ref 30.0–36.0)
MCV: 95.7 fL (ref 78.0–100.0)
Monocytes Absolute: 0.5 10*3/uL (ref 0.1–1.0)
Monocytes Relative: 7 % (ref 3–12)
Neutro Abs: 4 10*3/uL (ref 1.7–7.7)
Neutrophils Relative %: 60 % (ref 43–77)
Platelets: 223 10*3/uL (ref 150–400)
RBC: 4.15 MIL/uL — ABNORMAL LOW (ref 4.22–5.81)
RDW: 13.5 % (ref 11.5–15.5)
WBC: 6.8 10*3/uL (ref 4.0–10.5)

## 2013-03-19 LAB — BASIC METABOLIC PANEL WITH GFR
BUN: 18 mg/dL (ref 6–23)
CO2: 30 mEq/L (ref 19–32)
Calcium: 9.5 mg/dL (ref 8.4–10.5)
Chloride: 101 mEq/L (ref 96–112)
Creat: 1.32 mg/dL (ref 0.50–1.35)
GFR, Est African American: 60 mL/min
GFR, Est Non African American: 52 mL/min — ABNORMAL LOW
Glucose, Bld: 100 mg/dL — ABNORMAL HIGH (ref 70–99)
Potassium: 5.1 mEq/L (ref 3.5–5.3)
Sodium: 137 mEq/L (ref 135–145)

## 2013-03-19 LAB — HEPATIC FUNCTION PANEL
ALT: 10 U/L (ref 0–53)
AST: 22 U/L (ref 0–37)
Albumin: 4 g/dL (ref 3.5–5.2)
Alkaline Phosphatase: 68 U/L (ref 39–117)
Bilirubin, Direct: 0.1 mg/dL (ref 0.0–0.3)
Indirect Bilirubin: 0.5 mg/dL (ref 0.0–0.9)
Total Bilirubin: 0.6 mg/dL (ref 0.3–1.2)
Total Protein: 6.7 g/dL (ref 6.0–8.3)

## 2013-03-19 LAB — CK: Total CK: 54 U/L (ref 7–232)

## 2013-03-19 LAB — SEDIMENTATION RATE: Sed Rate: 8 mm/hr (ref 0–16)

## 2013-03-19 MED ORDER — HYDROCODONE-ACETAMINOPHEN 5-325 MG PO TABS: 1.0000 | ORAL_TABLET | Freq: Four times a day (QID) | ORAL | Status: DC | PRN

## 2013-03-19 NOTE — Patient Instructions (Signed)
Back, Compression Fracture °A compression fracture happens when a force is put upon the length of your spine. Slipping and falling on your bottom are examples of such a force. When this happens, sometimes the force is great enough to compress the building blocks (vertebral bodies) of your spine. Although this causes a lot of pain, this can usually be treated at home, unless your caregiver feels hospitalization is needed for pain control. °Your backbone (spinal column) is made up of 24 main vertebral bodies in addition to the sacrum and coccyx (see illustration). These are held together by tough fibrous tissues (ligaments) and by support of your muscles. Nerve roots pass through the openings between the vertebrae. A sudden wrenching move, injury, or a fall may cause a compression fracture of one of the vertebral bodies. This may result in back pain or spread of pain into the belly (abdomen), the buttocks, and down the leg into the foot. Pain may also be created by muscle spasm alone. °Large studies have been undertaken to determine the best possible course of action to help your back following injury and also to prevent future problems. The recommendations are as follows. °FOLLOWING A COMPRESSION FRACTURE: °Do the following only if advised by your caregiver.  °· If a back brace has been suggested or provided, wear it as directed. °· DO NOT stop wearing the back brace unless instructed by your caregiver. °· When allowed to return to regular activities, avoid a sedentary life style. Actively exercise. Sporadic weekend binges of tennis, racquetball, water skiing, may actually aggravate or create problems, especially if you are not in condition for that activity. °· Avoid sports requiring sudden body movements until you are in condition for them. Swimming and walking are safer activities. °· Maintain good posture. °· Avoid obesity. °· If not already done, you should have a DEXA scan. Based on the results, be treated for  osteoporosis. °FOLLOWING ACUTE (SUDDEN) INJURY: °· Only take over-the-counter or prescription medicines for pain, discomfort, or fever as directed by your caregiver. °· Use bed rest for only the most extreme acute episode. Prolonged bed rest may aggravate your condition. Ice used for acute conditions is effective. Use a large plastic bag filled with ice. Wrap it in a towel. This also provides excellent pain relief. This may be continuous. Or use it for 30 minutes every 2 hours during acute phase, then as needed. Heat for 30 minutes prior to activities is helpful. °· As soon as the acute phase (the time when your back is too painful for you to do normal activities) is over, it is important to resume normal activities and work hardening programs. Back injuries can cause potentially marked changes in lifestyle. So it is important to attack these problems aggressively. °· See your caregiver for continued problems. He or she can help or refer you for appropriate exercises, physical therapy and work hardening if needed. °· If you are given narcotic medications for your condition, for the next 24 hours DO NOT: °· Drive °· Operate machinery or power tools. °· Sign legal documents. °· DO NOT drink alcohol, take sleeping pills or other medications that may interfere with treatment. °If your caregiver has given you a follow-up appointment, it is very important to keep that appointment. Not keeping the appointment could result in a chronic or permanent injury, pain, and disability. If there is any problem keeping the appointment, you must call back to this facility for assistance.  °SEEK IMMEDIATE MEDICAL CARE IF: °· You develop numbness,   tingling, weakness, or problems with the use of your arms or legs. °· You develop severe back pain not relieved with medications. °· You have changes in bowel or bladder control. °· You have increasing pain in any areas of the body. °Document Released: 04/18/2005 Document Revised: 07/11/2011  Document Reviewed: 11/21/2007 °[employer]® Patient Information ©2014 [employer]. ° °

## 2013-03-19 NOTE — Progress Notes (Signed)
HPI Patient presents for a one month follow up. Patient states that the muscle in his legs want to "give way" on him, which he contributes to the Gabapentin. He has had a normal CXR without rib fx, normal CPK, and aldolase. Patient states that it has improved however it has moved across his back and a spot on the right front, the numbness has resolved. Patient did have osteopenia on recent DEXA. Patient denies CP, SOB, nausea, headaches.   Allergies:  Allergies  Allergen Reactions  . Cymbalta [Duloxetine Hcl]     Dizziness  . Gabapentin     Dysphoria  . Keflex [Cephalexin]   . Simvastatin   . Sudafed [Pseudoephedrine]     Dizziness  . Hydrocodone Rash  . Prednisone Rash    Current Medications:   Current Outpatient Prescriptions on File Prior to Visit  Medication Sig Dispense Refill  . aspirin 325 MG tablet Take 325 mg by mouth daily.        Marland Kitchen atenolol (TENORMIN) 25 MG tablet TAKE 1/2 TABLET BY MOUTH EVERY DAY  30 tablet  6  . Cholecalciferol (VITAMIN D3) 2000 UNITS TABS Take 1 tablet by mouth daily.        . Cyanocobalamin (VITAMIN B-12 IJ) Inject 1 mL as directed every 30 (thirty) days.        . Flaxseed, Linseed, 1000 MG CAPS Take 1 capsule by mouth daily.        Marland Kitchen gemfibrozil (LOPID) 600 MG tablet Take 600 mg by mouth 2 (two) times daily before a meal.        . multivitamin (THERAGRAN) per tablet Take 1 tablet by mouth daily.        Marland Kitchen NITROSTAT 0.4 MG SL tablet DISSOLVE 1 TABLET UNDER TONGUE EVERY 5 MINUTES AS NEEDED UP TO 3 DOSES  25 tablet  1  . omeprazole (PRILOSEC) 20 MG capsule Take 20 mg by mouth once.       . ONE TOUCH ULTRA TEST test strip        Current Facility-Administered Medications on File Prior to Visit  Medication Dose Route Frequency Provider Last Rate Last Dose  . 0.9 %  sodium chloride infusion  500 mL Intravenous Continuous Hilarie Fredrickson, MD        ROS: all negative expect above.   Physical: Filed Weights   03/19/13 0837  Weight: 193 lb (87.544 kg)    Filed Vitals:   03/19/13 0837  BP: 110/62  Pulse: 56  Temp: 98.3 F (36.8 C)  Resp: 16   General Appearance: Well nourished, in no apparent distress. Eyes: PERRLA, EOMs. Sinuses: No Frontal/maxillary tenderness ENT/Mouth: Ext aud canals clear, normal light reflex with TMs without erythema, bulging. Post pharynx without erythema, swelling, exudate.  Respiratory: CTAB Cardio: RRR, no murmurs, rubs or gallops. Peripheral pulses brisk and equal bilaterally, without edema. No aortic or femoral bruits. Abdomen: Flat, soft, with bowl sounds. Nontender, no guarding, rebound. Lymphatics: Non tender without lymphadenopathy.  Musculoskeletal: Right ribs around T9-T10 tender Skin: Warm, dry without rashes, lesions, ecchymosis.  Neuro: Cranial nerves intact, reflexes decreased bilateral legs. Normal muscle tone, no cerebellar symptoms. Pysch: Awake and oriented X 3, normal affect, Insight and Judgment appropriate.   Assessment and Plan: Muscle weakness- check CBC, BMP, LFTs, and CPK/aldolase. Continue Gabapentin for now.  Back pain- patient does not want an MRI and states it is improving. We will wait.  If the pain worsens or if you have any CP/SOB please go to the  ER.

## 2013-03-22 LAB — ALDOLASE: Aldolase: 5 U/L (ref <=8.1)

## 2013-04-11 ENCOUNTER — Encounter: Payer: Self-pay | Admitting: Internal Medicine

## 2013-04-11 ENCOUNTER — Ambulatory Visit (INDEPENDENT_AMBULATORY_CARE_PROVIDER_SITE_OTHER): Payer: Medicare Other | Admitting: Internal Medicine

## 2013-04-11 VITALS — BP 128/72 | HR 56 | Temp 97.5°F | Resp 18 | Wt 195.6 lb

## 2013-04-11 DIAGNOSIS — E559 Vitamin D deficiency, unspecified: Secondary | ICD-10-CM

## 2013-04-11 DIAGNOSIS — R7309 Other abnormal glucose: Secondary | ICD-10-CM

## 2013-04-11 DIAGNOSIS — I10 Essential (primary) hypertension: Secondary | ICD-10-CM

## 2013-04-11 DIAGNOSIS — E782 Mixed hyperlipidemia: Secondary | ICD-10-CM

## 2013-04-11 DIAGNOSIS — E1169 Type 2 diabetes mellitus with other specified complication: Secondary | ICD-10-CM | POA: Insufficient documentation

## 2013-04-11 DIAGNOSIS — E1141 Type 2 diabetes mellitus with diabetic mononeuropathy: Secondary | ICD-10-CM

## 2013-04-11 DIAGNOSIS — Z79899 Other long term (current) drug therapy: Secondary | ICD-10-CM

## 2013-04-11 LAB — CBC WITH DIFFERENTIAL/PLATELET
Basophils Absolute: 0 10*3/uL (ref 0.0–0.1)
Basophils Relative: 0 % (ref 0–1)
Eosinophils Absolute: 0.2 10*3/uL (ref 0.0–0.7)
Eosinophils Relative: 3 % (ref 0–5)
HCT: 37.6 % — ABNORMAL LOW (ref 39.0–52.0)
Hemoglobin: 12.7 g/dL — ABNORMAL LOW (ref 13.0–17.0)
Lymphocytes Relative: 30 % (ref 12–46)
Lymphs Abs: 1.8 10*3/uL (ref 0.7–4.0)
MCH: 31.8 pg (ref 26.0–34.0)
MCHC: 33.8 g/dL (ref 30.0–36.0)
MCV: 94.2 fL (ref 78.0–100.0)
Monocytes Absolute: 0.5 10*3/uL (ref 0.1–1.0)
Monocytes Relative: 8 % (ref 3–12)
Neutro Abs: 3.5 10*3/uL (ref 1.7–7.7)
Neutrophils Relative %: 59 % (ref 43–77)
Platelets: 227 10*3/uL (ref 150–400)
RBC: 3.99 MIL/uL — ABNORMAL LOW (ref 4.22–5.81)
RDW: 13.6 % (ref 11.5–15.5)
WBC: 5.9 10*3/uL (ref 4.0–10.5)

## 2013-04-11 LAB — BASIC METABOLIC PANEL WITH GFR
BUN: 17 mg/dL (ref 6–23)
CO2: 27 mEq/L (ref 19–32)
Calcium: 9.4 mg/dL (ref 8.4–10.5)
Chloride: 104 mEq/L (ref 96–112)
Creat: 1.1 mg/dL (ref 0.50–1.35)
GFR, Est African American: 74 mL/min
GFR, Est Non African American: 64 mL/min
Glucose, Bld: 104 mg/dL — ABNORMAL HIGH (ref 70–99)
Potassium: 4.3 mEq/L (ref 3.5–5.3)
Sodium: 139 mEq/L (ref 135–145)

## 2013-04-11 LAB — HEPATIC FUNCTION PANEL
ALT: 12 U/L (ref 0–53)
AST: 22 U/L (ref 0–37)
Albumin: 3.9 g/dL (ref 3.5–5.2)
Alkaline Phosphatase: 72 U/L (ref 39–117)
Bilirubin, Direct: 0.1 mg/dL (ref 0.0–0.3)
Indirect Bilirubin: 0.4 mg/dL (ref 0.0–0.9)
Total Bilirubin: 0.5 mg/dL (ref 0.3–1.2)
Total Protein: 6.3 g/dL (ref 6.0–8.3)

## 2013-04-11 LAB — HEMOGLOBIN A1C
Hgb A1c MFr Bld: 6 % — ABNORMAL HIGH (ref <5.7)
Mean Plasma Glucose: 126 mg/dL — ABNORMAL HIGH (ref <117)

## 2013-04-11 LAB — LIPID PANEL
Cholesterol: 173 mg/dL (ref 0–200)
HDL: 31 mg/dL — ABNORMAL LOW (ref >39)
LDL Cholesterol: 103 mg/dL — ABNORMAL HIGH (ref 0–99)
Total CHOL/HDL Ratio: 5.6 Ratio
Triglycerides: 197 mg/dL — ABNORMAL HIGH (ref <150)
VLDL: 39 mg/dL (ref 0–40)

## 2013-04-11 LAB — TSH: TSH: 2.113 u[IU]/mL (ref 0.350–4.500)

## 2013-04-11 LAB — MAGNESIUM: Magnesium: 2 mg/dL (ref 1.5–2.5)

## 2013-04-11 MED ORDER — HYDROCODONE-ACETAMINOPHEN 5-325 MG PO TABS: ORAL_TABLET | ORAL | Status: DC

## 2013-04-11 MED ORDER — GABAPENTIN 300 MG PO CAPS: 300.0000 mg | ORAL_CAPSULE | Freq: Three times a day (TID) | ORAL | Status: DC

## 2013-04-11 MED ORDER — VITAMIN D3 50 MCG (2000 UT) PO TABS: 2.0000 | ORAL_TABLET | Freq: Every day | ORAL | Status: AC

## 2013-04-11 MED ORDER — ATENOLOL 50 MG PO TABS: 50.0000 mg | ORAL_TABLET | Freq: Every day | ORAL | Status: DC

## 2013-04-11 NOTE — Patient Instructions (Signed)
Continue diet & medications same as discussed.   Further disposition pending lab results.    Hypertension As your heart beats, it forces blood through your arteries. This force is your blood pressure. If the pressure is too high, it is called hypertension (HTN) or high blood pressure. HTN is dangerous because you may have it and not know it. High blood pressure may mean that your heart has to work harder to pump blood. Your arteries may be narrow or stiff. The extra work puts you at risk for heart disease, stroke, and other problems.  Blood pressure consists of two numbers, a higher number over a lower, 110/72, for example. It is stated as "110 over 72." The ideal is below 120 for the top number (systolic) and under 80 for the bottom (diastolic). Write down your blood pressure today. You should pay close attention to your blood pressure if you have certain conditions such as:  Heart failure.  Prior heart attack.  Diabetes  Chronic kidney disease.  Prior stroke.  Multiple risk factors for heart disease. To see if you have HTN, your blood pressure should be measured while you are seated with your arm held at the level of the heart. It should be measured at least twice. A one-time elevated blood pressure reading (especially in the Emergency Department) does not mean that you need treatment. There may be conditions in which the blood pressure is different between your right and left arms. It is important to see your caregiver soon for a recheck. Most people have essential hypertension which means that there is not a specific cause. This type of high blood pressure may be lowered by changing lifestyle factors such as:  Stress.  Smoking.  Lack of exercise.  Excessive weight.  Drug/tobacco/alcohol use.  Eating less salt. Most people do not have symptoms from high blood pressure until it has caused damage to the body. Effective treatment can often prevent, delay or reduce that  damage. TREATMENT  When a cause has been identified, treatment for high blood pressure is directed at the cause. There are a large number of medications to treat HTN. These fall into several categories, and your caregiver will help you select the medicines that are best for you. Medications may have side effects. You should review side effects with your caregiver. If your blood pressure stays high after you have made lifestyle changes or started on medicines,   Your medication(s) may need to be changed.  Other problems may need to be addressed.  Be certain you understand your prescriptions, and know how and when to take your medicine.  Be sure to follow up with your caregiver within the time frame advised (usually within two weeks) to have your blood pressure rechecked and to review your medications.  If you are taking more than one medicine to lower your blood pressure, make sure you know how and at what times they should be taken. Taking two medicines at the same time can result in blood pressure that is too low. SEEK IMMEDIATE MEDICAL CARE IF:  You develop a severe headache, blurred or changing vision, or confusion.  You have unusual weakness or numbness, or a faint feeling.  You have severe chest or abdominal pain, vomiting, or breathing problems. MAKE SURE YOU:   Understand these instructions.  Will watch your condition.  Will get help right away if you are not doing well or get worse. Document Released: 04/18/2005 Document Revised: 07/11/2011 Document Reviewed: 12/07/2007 Exit[employer] Patient Information 2014 [employer].  Diabetes and Exercise Exercising regularly is important. It is not just about losing weight. It has many health benefits, such as:  Improving your overall fitness, flexibility, and endurance.  Increasing your bone density.  Helping with weight control.  Decreasing your body fat.  Increasing your muscle strength.  Reducing stress and  tension.  Improving your overall health. People with diabetes who exercise gain additional benefits because exercise:  Reduces appetite.  Improves the body's use of blood sugar (glucose).  Helps lower or control blood glucose.  Decreases blood pressure.  Helps control blood lipids (such as cholesterol and triglycerides).  Improves the body's use of the hormone insulin by:  Increasing the body's insulin sensitivity.  Reducing the body's insulin needs.  Decreases the risk for heart disease because exercising:  Lowers cholesterol and triglycerides levels.  Increases the levels of good cholesterol (such as high-density lipoproteins [HDL]) in the body.  Lowers blood glucose levels. YOUR ACTIVITY PLAN  Choose an activity that you enjoy and set realistic goals. Your health care provider or diabetes educator can help you make an activity plan that works for you. You can break activities into 2 or 3 sessions throughout the day. Doing so is as good as one long session. Exercise ideas include:  Taking the dog for a walk.  Taking the stairs instead of the elevator.  Dancing to your favorite song.  Doing your favorite exercise with a friend. RECOMMENDATIONS FOR EXERCISING WITH TYPE 1 OR TYPE 2 DIABETES   Check your blood glucose before exercising. If blood glucose levels are greater than 240 mg/dL, check for urine ketones. Do not exercise if ketones are present.  Avoid injecting insulin into areas of the body that are going to be exercised. For example, avoid injecting insulin into:  The arms when playing tennis.  The legs when jogging.  Keep a record of:  Food intake before and after you exercise.  Expected peak times of insulin action.  Blood glucose levels before and after you exercise.  The type and amount of exercise you have done.  Review your records with your health care provider. Your health care provider will help you to develop guidelines for adjusting food  intake and insulin amounts before and after exercising.  If you take insulin or oral hypoglycemic agents, watch for signs and symptoms of hypoglycemia. They include:  Dizziness.  Shaking.  Sweating.  Chills.  Confusion.  Drink plenty of water while you exercise to prevent dehydration or heat stroke. Body water is lost during exercise and must be replaced.  Talk to your health care provider before starting an exercise program to make sure it is safe for you. Remember, almost any type of activity is better than none. Document Released: 07/09/2003 Document Revised: 12/19/2012 Document Reviewed: 09/25/2012 Exit[employer] Patient Information 2014 [employer].  Cholesterol Cholesterol is a white, waxy, fat-like protein needed by your body in small amounts. The liver makes all the cholesterol you need. It is carried from the liver by the blood through the blood vessels. Deposits (plaque) may build up on blood vessel walls. This makes the arteries narrower and stiffer. Plaque increases the risk for heart attack and stroke. You cannot feel your cholesterol level even if it is very high. The only way to know is by a blood test to check your lipid (fats) levels. Once you know your cholesterol levels, you should keep a record of the test results. Work with your caregiver to to keep your levels in the   desired range. WHAT THE RESULTS MEAN:  Total cholesterol is a rough measure of all the cholesterol in your blood.  LDL is the so-called bad cholesterol. This is the type that deposits cholesterol in the walls of the arteries. You want this level to be low.  HDL is the good cholesterol because it cleans the arteries and carries the LDL away. You want this level to be high.  Triglycerides are fat that the body can either burn for energy or store. High levels are closely linked to heart disease. DESIRED LEVELS:  Total cholesterol below 200.  LDL below 100 for people at risk, below 70 for very high  risk.  HDL above 50 is good, above 60 is best.  Triglycerides below 150. HOW TO LOWER YOUR CHOLESTEROL:  Diet.  Choose fish or white meat chicken and turkey, roasted or baked. Limit fatty cuts of red meat, fried foods, and processed meats, such as sausage and lunch meat.  Eat lots of fresh fruits and vegetables. Choose whole grains, beans, pasta, potatoes and cereals.  Use only small amounts of olive, corn or canola oils. Avoid butter, mayonnaise, shortening or palm kernel oils. Avoid foods with trans-fats.  Use skim/nonfat milk and low-fat/nonfat yogurt and cheeses. Avoid whole milk, cream, ice cream, egg yolks and cheeses. Healthy desserts include angel food cake, ginger snaps, animal crackers, hard candy, popsicles, and low-fat/nonfat frozen yogurt. Avoid pastries, cakes, pies and cookies.  Exercise.  A regular program helps decrease LDL and raises HDL.  Helps with weight control.  Do things that increase your activity level like gardening, walking, or taking the stairs.  Medication.  May be prescribed by your caregiver to help lowering cholesterol and the risk for heart disease.  You may need medicine even if your levels are normal if you have several risk factors. HOME CARE INSTRUCTIONS   Follow your diet and exercise programs as suggested by your caregiver.  Take medications as directed.  Have blood work done when your caregiver feels it is necessary. MAKE SURE YOU:   Understand these instructions.  Will watch your condition.  Will get help right away if you are not doing well or get worse. Document Released: 01/11/2001 Document Revised: 07/11/2011 Document Reviewed: 07/04/2007 Exit[employer] Patient Information 2014 [employer].  Vitamin D Deficiency Vitamin D is an important vitamin that your body needs. Having too little of it in your body is called a deficiency. A very bad deficiency can make your bones soft and can cause a condition called rickets.  Vitamin D  is important to your body for different reasons, such as:   It helps your body absorb 2 minerals called calcium and phosphorus.  It helps make your bones healthy.  It may prevent some diseases, such as diabetes and multiple sclerosis.  It helps your muscles and heart. You can get vitamin D in several ways. It is a natural part of some foods. The vitamin is also added to some dairy products and cereals. Some people take vitamin D supplements. Also, your body makes vitamin D when you are in the sun. It changes the sun's rays into a form of the vitamin that your body can use. CAUSES   Not eating enough foods that contain vitamin D.  Not getting enough sunlight.  Having certain digestive system diseases that make it hard to absorb vitamin D. These diseases include Crohn's disease, chronic pancreatitis, and cystic fibrosis.  Having a surgery in which part of the stomach or small intestine is removed.    Being obese. Fat cells pull vitamin D out of your blood. That means that obese people may not have enough vitamin D left in their blood and in other body tissues.  Having chronic kidney or liver disease. RISK FACTORS Risk factors are things that make you more likely to develop a vitamin D deficiency. They include:  Being older.  Not being able to get outside very much.  Living in a nursing home.  Having had broken bones.  Having weak or thin bones (osteoporosis).  Having a disease or condition that changes how your body absorbs vitamin D.  Having dark skin.  Some medicines such as seizure medicines or steroids.  Being overweight or obese. SYMPTOMS Mild cases of vitamin D deficiency may not have any symptoms. If you have a very bad case, symptoms may include:  Bone pain.  Muscle pain.  Falling often.  Broken bones caused by a minor injury, due to osteoporosis. DIAGNOSIS A blood test is the best way to tell if you have a vitamin D deficiency. TREATMENT Vitamin D  deficiency can be treated in different ways. Treatment for vitamin D deficiency depends on what is causing it. Options include:  Taking vitamin D supplements.  Taking a calcium supplement. Your caregiver will suggest what dose is best for you. HOME CARE INSTRUCTIONS  Take any supplements that your caregiver prescribes. Follow the directions carefully. Take only the suggested amount.  Have your blood tested 2 months after you start taking supplements.  Eat foods that contain vitamin D. Healthy choices include:  Fortified dairy products, cereals, or juices. Fortified means vitamin D has been added to the food. Check the label on the package to be sure.  Fatty fish like salmon or trout.  Eggs.  Oysters.  Do not use a tanning bed.  Keep your weight at a healthy level. Lose weight if you need to.  Keep all follow-up appointments. Your caregiver will need to perform blood tests to make sure your vitamin D deficiency is going away. SEEK MEDICAL CARE IF:  You have any questions about your treatment.  You continue to have symptoms of vitamin D deficiency.  You have nausea or vomiting.  You are constipated.  You feel confused.  You have severe abdominal or back pain. MAKE SURE YOU:  Understand these instructions.  Will watch your condition.  Will get help right away if you are not doing well or get worse. Document Released: 07/11/2011 Document Revised: 08/13/2012 Document Reviewed: 07/11/2011 [employer] Patient Information 2014 [employer].  

## 2013-04-11 NOTE — Progress Notes (Signed)
Patient ID: [name], male   DOB: 1934-01-23, 79 y.o.   MRN: [id number]   This very nice 79 yo MWM presents for 3 month follow up with Hypertension (1995), ASHD/PTCA?Stent x 2 (2005 and 2011),  Hyperlipidemia, Pre-Diabetes (2008) and Vitamin D Deficiency.    BP has been controlled at home. Today's BP is 128/72. Patient remains fairly active and denies any cardiac type chest pain, palpitations, dyspnea/orthopnea/PND, dizziness, claudication, or dependent edema.   Hyperlipidemia is controlled with diet & meds. Last Cholesterol was  149, Triglycerides were 71, HDL 40  and LDL 95 - near goal. Patient denies myalgias or other med SE's.    Also, the patient has history of PreDiabetes predating since 2008  with last A1c of 5.9% in June . He reports FBG's range 87-128 mg%.  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 Incidentally, he did have dBMD which showed osteopenia.   Further, Patient has history of Vitamin D Deficiency with last vitamin D of 86 in June (was 48 in 2008). Patient supplements vitamin D without any suspected side-effects.  Medication Sig Dispense Refill  . aspirin 325 MG tablet Take 325 mg by mouth daily.        Marland Kitchen atenolol (TENORMIN) 25 MG tablet TAKE 1/2 TABLET BY MOUTH EVERY DAY  30 tablet  6  . Cholecalciferol (VITAMIN D3) 2000 UNITS TABS Take 1 tablet by mouth daily.        . Cyanocobalamin (VITAMIN B-12 IJ) Inject 1 mL as directed every 30 (thirty) days.        . Flaxseed, Linseed, 1000 MG CAPS Take 1 capsule by mouth daily.        Marland Kitchen gemfibrozil (LOPID) 600 MG tablet Take 600 mg by mouth 2 (two) times daily before a meal.        . HYDROcodone-acetaminophen (NORCO) 5-325 MG per tablet Take 1 tablet by mouth every 6 (six) hours as  needed for moderate pain.  90 tablet  0  . multivitamin (THERAGRAN) per tablet Take 1 tablet by mouth daily.        Marland Kitchen NITROSTAT 0.4 MG SL tablet DISSOLVE 1 TABLET UNDER TONGUE EVERY 5 MINUTES AS NEEDED UP TO 3 DOSES  25 tablet  1  . omeprazole (PRILOSEC) 20 MG capsule Take 20 mg by mouth once.       . ONE TOUCH ULTRA TEST test strip           Allergies  Allergen Reactions  . Cymbalta [Duloxetine Hcl]     Dizziness  . Gabapentin     Dysphoria  . Keflex [Cephalexin]   . Simvastatin   . Sudafed [Pseudoephedrine]     Dizziness  . Hydrocodone Rash  . Prednisone Rash    PMHx:   Past Medical History  Diagnosis Date  . Coronary atherosclerosis of unspecified type of vessel, native or graft   . Aneurysm of iliac artery   . Personal history of other diseases of digestive system   . Other and unspecified hyperlipidemia   . Colon polyps   . Gastritis, chronic   . IBS (irritable bowel syndrome)   . Hypertension   . Diabetes mellitus     Diet control   . Vitamin D deficiency   .  Vitamin B 12 deficiency   . Esophageal reflux     FHx:    Reviewed / unchanged  SHx:    Reviewed / unchanged  Systems Review: Constitutional: Denies fever, chills, wt changes, headaches, insomnia, fatigue, night sweats, change in appetite. Eyes: Denies redness, blurred vision, diplopia, discharge, itchy, watery eyes.  ENT: Denies discharge, congestion, post nasal drip, epistaxis, sore throat, earache, hearing loss, dental pain, tinnitus, vertigo, sinus pain, snoring.  CV: Denies chest pain, palpitations, irregular heartbeat, syncope, dyspnea, diaphoresis, orthopnea, PND, claudication, edema. Respiratory: denies cough, dyspnea, DOE, pleurisy, hoarseness, laryngitis, wheezing.  Gastrointestinal: Denies dysphagia, odynophagia, heartburn, reflux, water brash, abdominal pain or cramps, nausea, vomiting, bloating, diarrhea, constipation, hematemesis, melena, hematochezia,  or hemorrhoids. Genitourinary: Denies  dysuria, frequency, urgency, nocturia, hesitancy, discharge, hematuria, flank pain. Musculoskeletal: Denies arthralgias, myalgias, stiffness, jt. swelling, pain, limp, strain/sprain.  Skin: Denies pruritus, rash, hives, warts, acne, eczema, change in skin lesion(s). Neuro: No weakness, tremor, incoordination, spasms, paresthesia, or pain. Psychiatric: Denies confusion, memory loss, or sensory loss. Endo: Denies change in weight, skin, hair change.  Heme/Lymph: No excessive bleeding, bruising, orenlarged lymph nodes.  There were no vitals filed for this visit.  Estimated body mass index is 25.47 kg/(m^2) as calculated from the following:   Height as of 05/05/11: 6\' 1"  (1.854 m).   Weight as of 03/19/13: 193 lb (87.544 kg).  On Exam: Appears well nourished - in no distress. Eyes: PERRLA, EOMs, conjunctiva no swelling or erythema. Sinuses: No frontal/maxillary tenderness ENT/Mouth: EAC's clear, TM's nl w/o erythema, bulging. Nares clear w/o erythema, swelling, exudates. Oropharynx clear without erythema or exudates. Oral hygiene is good. Tongue normal, non obstructing. Hearing intact.  Neck: Supple. Thyroid nl. Car 2+/2+ without bruits, nodes or JVD. Chest: Respirations nl with BS clear & equal w/o rales, rhonchi, wheezing or stridor.  Cor: Heart sounds normal w/ regular rate and rhythm without sig. murmurs, gallops, clicks, or rubs. Peripheral pulses normal and equal  without edema.  Abdomen: Soft & bowel sounds normal. Non-tender w/o guarding, rebound, hernias, masses, or organomegaly.  Lymphatics: Unremarkable.  Musculoskeletal: Full ROM all peripheral extremities, joint stability, 5/5 strength, and normal gait.  Skin: Warm, dry without exposed rashes, lesions, ecchymosis apparent.  Neuro: Cranial nerves intact, reflexes equal bilaterally. Sensory-motor testing grossly intact. Tendon reflexes grossly intact.  Pysch: Alert & oriented x 3. Insight and judgement nl & appropriate. No  ideations.  Assessment and Plan:  1. Hypertension - Continue monitor blood pressure at home. Continue diet/meds same.  2. Hyperlipidemia - Continue diet/meds, exercise,& lifestyle modifications. Continue monitor periodic cholesterol/liver & renal functions   3. Pre-diabetes - Continue diet, exercise, lifestyle modifications. Monitor appropriate labs.  4. Vitamin D Deficiency - Continue supplementation.  Recommended regular exercise, BP monitoring, weight control, and discussed med and SE's. Recommended labs to assess and monitor clinical status. Further disposition pending results of labs.

## 2013-04-12 LAB — VITAMIN D 25 HYDROXY (VIT D DEFICIENCY, FRACTURES): Vit D, 25-Hydroxy: 82 ng/mL (ref 30–89)

## 2013-04-12 LAB — INSULIN, FASTING: Insulin fasting, serum: 13 u[IU]/mL (ref 3–28)

## 2013-04-15 ENCOUNTER — Telehealth: Payer: Self-pay

## 2013-04-15 NOTE — Telephone Encounter (Signed)
lmom to pt to return my call. 

## 2013-04-15 NOTE — Telephone Encounter (Signed)
Message copied by Joya Martyr on Mon Apr 15, 2013 12:14 PM ------      Message from: Lucky Cowboy      Created: Sun Apr 14, 2013 11:35 PM       chol 173 -excellent            A1c 6.0% - average glucose 126 mg% - avoid sweets/candy / white rice & potatoes - breads and pasta and lose weight .            All else normal and ok      Keep up great work ------

## 2013-04-24 ENCOUNTER — Encounter: Payer: Self-pay | Admitting: Cardiology

## 2013-04-24 ENCOUNTER — Ambulatory Visit (HOSPITAL_COMMUNITY): Payer: Medicare Other | Attending: Cardiology

## 2013-04-24 DIAGNOSIS — J4489 Other specified chronic obstructive pulmonary disease: Secondary | ICD-10-CM | POA: Insufficient documentation

## 2013-04-24 DIAGNOSIS — I714 Abdominal aortic aneurysm, without rupture, unspecified: Secondary | ICD-10-CM | POA: Insufficient documentation

## 2013-04-24 DIAGNOSIS — E119 Type 2 diabetes mellitus without complications: Secondary | ICD-10-CM | POA: Insufficient documentation

## 2013-04-24 DIAGNOSIS — I723 Aneurysm of iliac artery: Secondary | ICD-10-CM | POA: Insufficient documentation

## 2013-04-24 DIAGNOSIS — J449 Chronic obstructive pulmonary disease, unspecified: Secondary | ICD-10-CM | POA: Insufficient documentation

## 2013-04-24 DIAGNOSIS — I2510 Atherosclerotic heart disease of native coronary artery without angina pectoris: Secondary | ICD-10-CM | POA: Insufficient documentation

## 2013-04-24 DIAGNOSIS — Z87891 Personal history of nicotine dependence: Secondary | ICD-10-CM | POA: Insufficient documentation

## 2013-04-24 DIAGNOSIS — I7789 Other specified disorders of arteries and arterioles: Secondary | ICD-10-CM | POA: Insufficient documentation

## 2013-05-22 ENCOUNTER — Other Ambulatory Visit: Payer: Self-pay | Admitting: Pain Medicine

## 2013-05-22 DIAGNOSIS — M545 Low back pain, unspecified: Secondary | ICD-10-CM

## 2013-05-22 DIAGNOSIS — M546 Pain in thoracic spine: Secondary | ICD-10-CM

## 2013-05-27 ENCOUNTER — Ambulatory Visit
Admission: RE | Admit: 2013-05-27 | Discharge: 2013-05-27 | Disposition: A | Discharge status: Home / Self Care | Payer: Medicare Other | Source: Ambulatory Visit | Attending: Pain Medicine | Admitting: Pain Medicine

## 2013-05-27 DIAGNOSIS — M545 Low back pain, unspecified: Secondary | ICD-10-CM

## 2013-05-27 DIAGNOSIS — M546 Pain in thoracic spine: Secondary | ICD-10-CM

## 2013-07-15 ENCOUNTER — Ambulatory Visit (INDEPENDENT_AMBULATORY_CARE_PROVIDER_SITE_OTHER): Payer: Medicare Other | Admitting: Emergency Medicine

## 2013-07-15 ENCOUNTER — Encounter: Payer: Self-pay | Admitting: Emergency Medicine

## 2013-07-15 VITALS — BP 136/70 | HR 64 | Temp 98.0°F | Resp 18 | Ht 72.5 in | Wt 192.0 lb

## 2013-07-15 DIAGNOSIS — I10 Essential (primary) hypertension: Secondary | ICD-10-CM

## 2013-07-15 DIAGNOSIS — R7309 Other abnormal glucose: Secondary | ICD-10-CM

## 2013-07-15 DIAGNOSIS — M549 Dorsalgia, unspecified: Secondary | ICD-10-CM

## 2013-07-15 DIAGNOSIS — E782 Mixed hyperlipidemia: Secondary | ICD-10-CM

## 2013-07-15 LAB — CBC WITH DIFFERENTIAL/PLATELET
Basophils Absolute: 0.1 10*3/uL (ref 0.0–0.1)
Basophils Relative: 1 % (ref 0–1)
Eosinophils Absolute: 0.2 10*3/uL (ref 0.0–0.7)
Eosinophils Relative: 3 % (ref 0–5)
HCT: 38.9 % — ABNORMAL LOW (ref 39.0–52.0)
Hemoglobin: 13.2 g/dL (ref 13.0–17.0)
Lymphocytes Relative: 28 % (ref 12–46)
Lymphs Abs: 1.8 10*3/uL (ref 0.7–4.0)
MCH: 31.8 pg (ref 26.0–34.0)
MCHC: 33.9 g/dL (ref 30.0–36.0)
MCV: 93.7 fL (ref 78.0–100.0)
Monocytes Absolute: 0.4 10*3/uL (ref 0.1–1.0)
Monocytes Relative: 6 % (ref 3–12)
Neutro Abs: 3.9 10*3/uL (ref 1.7–7.7)
Neutrophils Relative %: 62 % (ref 43–77)
Platelets: 220 10*3/uL (ref 150–400)
RBC: 4.15 MIL/uL — ABNORMAL LOW (ref 4.22–5.81)
RDW: 14.7 % (ref 11.5–15.5)
WBC: 6.3 10*3/uL (ref 4.0–10.5)

## 2013-07-15 LAB — BASIC METABOLIC PANEL WITH GFR
BUN: 17 mg/dL (ref 6–23)
CO2: 32 mEq/L (ref 19–32)
Calcium: 9.6 mg/dL (ref 8.4–10.5)
Chloride: 103 mEq/L (ref 96–112)
Creat: 1.32 mg/dL (ref 0.50–1.35)
GFR, Est African American: 59 mL/min — ABNORMAL LOW
GFR, Est Non African American: 51 mL/min — ABNORMAL LOW
Glucose, Bld: 118 mg/dL — ABNORMAL HIGH (ref 70–99)
Potassium: 5.6 mEq/L — ABNORMAL HIGH (ref 3.5–5.3)
Sodium: 140 mEq/L (ref 135–145)

## 2013-07-15 LAB — HEPATIC FUNCTION PANEL
ALT: 12 U/L (ref 0–53)
AST: 21 U/L (ref 0–37)
Albumin: 4 g/dL (ref 3.5–5.2)
Alkaline Phosphatase: 71 U/L (ref 39–117)
Bilirubin, Direct: 0.1 mg/dL (ref 0.0–0.3)
Indirect Bilirubin: 0.4 mg/dL (ref 0.2–1.2)
Total Bilirubin: 0.5 mg/dL (ref 0.2–1.2)
Total Protein: 6.4 g/dL (ref 6.0–8.3)

## 2013-07-15 LAB — LIPID PANEL
Cholesterol: 159 mg/dL (ref 0–200)
HDL: 30 mg/dL — ABNORMAL LOW (ref >39)
LDL Cholesterol: 100 mg/dL — ABNORMAL HIGH (ref 0–99)
Total CHOL/HDL Ratio: 5.3 Ratio
Triglycerides: 146 mg/dL (ref <150)
VLDL: 29 mg/dL (ref 0–40)

## 2013-07-15 LAB — HEMOGLOBIN A1C
Hgb A1c MFr Bld: 6 % — ABNORMAL HIGH (ref <5.7)
Mean Plasma Glucose: 126 mg/dL — ABNORMAL HIGH (ref <117)

## 2013-07-15 NOTE — Progress Notes (Signed)
Subjective:    Patient ID: [name], male    DOB: 02/21/1934, 80 y.o.   MRN: [id number]  HPI Comments: 80 yo male presents for 3 month F/U for HTN, Cholesterol, Pre-Dm, D. Deficient. He notes his BP is good at home. He refuses statins. He has not been exercising because of his back pain or eating as healthy.  CHOL         173   04/11/2013 HDL           31   04/11/2013 LDLCALC      103   04/11/2013 TRIG         197   04/11/2013 CHOLHDL      5.6   04/11/2013 ALT           12   04/11/2013 AST           22   04/11/2013 ALKPHOS       72   04/11/2013 BILITOT      0.5   04/11/2013 CREATININE     1.10   04/11/2013 BUN              17   04/11/2013 NA              139   04/11/2013 K               4.3   04/11/2013 CL              104   04/11/2013 CO2              27   04/11/2013 WBC      5.9   04/11/2013 HGB     12.7   04/11/2013 HCT     37.6   04/11/2013 MCV     94.2   04/11/2013 PLT      227   04/11/2013 HGBA1C      6.0   04/11/2013  He has been taking Gabapentin for his back pain and Norco which has helped. He has seen pain management and has not been pleased with the way they have handled his care. He is scheduled to get injections soon. He notes pain radiates from back around to stomach.  He notes mild changes in skin on face and notes he has derm apt in 1-2 months for 6 month f/u.   Hypertension      Medication List       This list is accurate as of: 07/15/13  8:59 AM.  Always use your most recent med list.               aspirin 325 MG tablet  Take 325 mg by mouth daily.     atenolol 50 MG tablet  Commonly known as:  TENORMIN  Take 1 tablet (50 mg total) by mouth daily.     Flaxseed (Linseed) 1000 MG Caps  Take 1 capsule by mouth daily.     gabapentin 300 MG capsule  Commonly known as:  NEURONTIN  Take 1 capsule (300 mg total) by mouth 3 (three) times daily.     HYDROcodone-acetaminophen 5-325 MG per tablet  Commonly known as:  NORCO  1/2 to 1 tablet  every 4 to 6 hours for severe pain     multivitamin per tablet  Take 1 tablet by mouth daily.     NITROSTAT 0.4 MG SL tablet  Generic drug:  nitroGLYCERIN  DISSOLVE 1 TABLET UNDER TONGUE EVERY 5 MINUTES AS NEEDED UP  TO 3 DOSES     omeprazole 20 MG capsule  Commonly known as:  PRILOSEC  Take 20 mg by mouth once.     ONE TOUCH ULTRA TEST test strip  Generic drug:  glucose blood     VITAMIN B-12 IJ  Inject 1 mL as directed every 30 (thirty) days.        Allergies  Allergen Reactions  . Cymbalta [Duloxetine Hcl]     Dizziness  . Keflex [Cephalexin]   . Simvastatin   . Sudafed [Pseudoephedrine]     Dizziness  . Prednisone Rash   Past Medical History  Diagnosis Date  . Coronary atherosclerosis of unspecified type of vessel, native or graft   . Aneurysm of iliac artery   . Personal history of other diseases of digestive system   . Other and unspecified hyperlipidemia   . Colon polyps   . Gastritis, chronic   . IBS (irritable bowel syndrome)   . Hypertension   . Diabetes mellitus     Diet control   . Vitamin D deficiency   . Vitamin B 12 deficiency   . Esophageal reflux       Review of Systems  Gastrointestinal: Positive for abdominal pain.  Musculoskeletal: Positive for back pain.  Skin: Positive for color change.  All other systems reviewed and are negative.   BP 136/70  Pulse 64  Temp(Src) 98 F (36.7 C) (Temporal)  Resp 18  Ht 6' 0.5" (1.842 m)  Wt 192 lb (87.091 kg)  BMI 25.67 kg/m2     Objective:   Physical Exam  Nursing note and vitals reviewed. Constitutional: He is oriented to person, place, and time. He appears well-developed and well-nourished.  HENT:  Head: Normocephalic and atraumatic.    Right Ear: External ear normal.  Left Ear: External ear normal.  Nose: Nose normal.  Eyes: Conjunctivae and EOM are normal.  Neck: Normal range of motion. Neck supple. No JVD present. No thyromegaly present.  Cardiovascular: Normal rate, regular  rhythm, normal heart sounds and intact distal pulses.   Pulmonary/Chest: Effort normal and breath sounds normal.  Abdominal: Soft. Bowel sounds are normal. He exhibits no distension and no mass. There is no tenderness. There is no rebound and no guarding.  Musculoskeletal: Normal range of motion. He exhibits no edema and no tenderness.  Lymphadenopathy:    He has no cervical adenopathy.  Neurological: He is alert and oriented to person, place, and time. He has normal reflexes. No cranial nerve deficit. Coordination normal.  Skin: Skin is warm and dry.  Erythematous/ scaling 3-4 mm elevation see illustration  Psychiatric: He has a normal mood and affect. His behavior is normal. Judgment and thought content normal.          Assessment & Plan:  1.  3 month F/U for HTN, Cholesterol, Pre-Dm, D. Deficient. Needs healthy diet, cardio QD and obtain healthy weight. Check Labs, Check BP if >130/80 call office  2. Back pain radiates to stomach- If no relief with injections will get ABD/Pelvis CT. Continue RX AD  3. Probable Sq CC- advise close f/u DERM ASAP

## 2013-07-15 NOTE — Patient Instructions (Signed)
Hypertension Hypertension is another name for high blood pressure. High blood pressure may mean that your heart needs to work harder to pump blood. Blood pressure consists of two numbers, which includes a higher number over a lower number (example: 110/72). HOME CARE   Make lifestyle changes as told by your doctor. This may include weight loss and exercise.  Take your blood pressure medicine every day.  Limit how much salt you use.  Stop smoking if you smoke.  Do not use drugs.  Talk to your doctor if you are using decongestants or birth control pills. These medicines might make blood pressure higher.  Females should not drink more than 1 alcoholic drink per day. Males should not drink more than 2 alcoholic drinks per day.  See your doctor as told. GET HELP RIGHT AWAY IF:   You have a blood pressure reading with a top number of 180 or higher.  You get a very bad headache.  You get blurred or changing vision.  You feel confused.  You feel weak, numb, or faint.  You get chest or belly (abdominal) pain.  You throw up (vomit).  You cannot breathe very well. MAKE SURE YOU:   Understand these instructions.  Will watch your condition.  Will get help right away if you are not doing well or get worse. Document Released: 10/05/2007 Document Revised: 07/11/2011 Document Reviewed: 10/05/2007 [employer] Patient Information 2014 [employer].  

## 2013-07-16 LAB — INSULIN, FASTING: Insulin fasting, serum: 23 u[IU]/mL (ref 3–28)

## 2013-08-13 ENCOUNTER — Ambulatory Visit (INDEPENDENT_AMBULATORY_CARE_PROVIDER_SITE_OTHER): Payer: Medicare Other | Admitting: Emergency Medicine

## 2013-08-13 ENCOUNTER — Encounter: Payer: Self-pay | Admitting: Emergency Medicine

## 2013-08-13 VITALS — BP 138/72 | HR 58 | Temp 98.0°F | Resp 18 | Ht 72.5 in | Wt 195.0 lb

## 2013-08-13 DIAGNOSIS — J309 Allergic rhinitis, unspecified: Secondary | ICD-10-CM

## 2013-08-13 DIAGNOSIS — R1032 Left lower quadrant pain: Secondary | ICD-10-CM

## 2013-08-13 MED ORDER — PREDNISONE 10 MG PO TABS: ORAL_TABLET | ORAL | Status: DC

## 2013-08-13 NOTE — Progress Notes (Signed)
Subjective:    Patient ID: [name], male    DOB: [birth year], 80 y.o.   MRN: [id number]  HPI Comments: 80 yo male with continued LLQ pain. He notes mild pain in back and right side. He notes even light pressure in the evening makes symptoms worse. He takes citrucel BID and makes BM tolerable. He occasionally has pain with BM in LLQ but not with every BM.He has had back evaluation with pain management and has not had relief with pain. Initially abdomen pain was believd to be coming from back pain. He has had Colonoscopy 2012, CT ABd pelvis 2001 and 2010. He denies any hx of bowel surgery or obstruction.   He has had increased allergy drainage over last couple of weeks. He has been using Mucinex without relief. He has noticed increased hoarseness.  Abdominal Pain     Medication List       This list is accurate as of: 08/13/13 10:03 AM.  Always use your most recent med list.               aspirin 325 MG tablet  Take 325 mg by mouth daily.     atenolol 50 MG tablet  Commonly known as:  TENORMIN  Take 1 tablet (50 mg total) by mouth daily.     Flaxseed (Linseed) 1000 MG Caps  Take 1 capsule by mouth daily.     gabapentin 300 MG capsule  Commonly known as:  NEURONTIN  Take 1 capsule (300 mg total) by mouth 3 (three) times daily.     HYDROcodone-acetaminophen 5-325 MG per tablet  Commonly known as:  NORCO  1/2 to 1 tablet every 4 to 6 hours for severe pain     multivitamin per tablet  Take 1 tablet by mouth daily.     NITROSTAT 0.4 MG SL tablet  Generic drug:  nitroGLYCERIN  DISSOLVE 1 TABLET UNDER TONGUE EVERY 5 MINUTES AS NEEDED UP TO 3 DOSES     omeprazole 20 MG capsule  Commonly known as:  PRILOSEC  Take 20 mg by mouth once.     ONE TOUCH ULTRA TEST test strip  Generic drug:  glucose blood     VITAMIN B-12 IJ  Inject 1 mL as directed every 30 (thirty) days.       Allergies  Allergen Reactions  . Cymbalta [Duloxetine Hcl]     Dizziness  . Keflex  [Cephalexin]   . Simvastatin   . Sudafed [Pseudoephedrine]     Dizziness  . Prednisone Rash   Past Medical History  Diagnosis Date  . Coronary atherosclerosis of unspecified type of vessel, native or graft   . Aneurysm of iliac artery   . Personal history of other diseases of digestive system   . Other and unspecified hyperlipidemia   . Colon polyps   . Gastritis, chronic   . IBS (irritable bowel syndrome)   . Hypertension   . Diabetes mellitus     Diet control   . Vitamin D deficiency   . Vitamin B 12 deficiency   . Esophageal reflux        Review of Systems  HENT: Positive for postnasal drip and voice change.   Gastrointestinal: Positive for abdominal pain.  All other systems reviewed and are negative.      Objective:   Physical Exam  Nursing note and vitals reviewed. Constitutional: He is oriented to person, place, and time. He appears well-developed and well-nourished.  HENT:  Head: Normocephalic and  atraumatic.  Right Ear: External ear normal.  Left Ear: External ear normal.  Nose: Nose normal.  Mouth/Throat: No oropharyngeal exudate.  Cloudy TM's bilaterally   Eyes: Conjunctivae and EOM are normal.  Neck: Normal range of motion. Neck supple. No JVD present. No thyromegaly present.  Cardiovascular: Normal rate, regular rhythm, normal heart sounds and intact distal pulses.   Pulmonary/Chest: Effort normal and breath sounds normal.  Abdominal: Soft. Bowel sounds are normal. He exhibits no distension and no mass. There is tenderness. There is no rebound and no guarding.  LLQ and mild mid abdomen  Musculoskeletal: Normal range of motion. He exhibits no edema and no tenderness.  Lymphadenopathy:    He has no cervical adenopathy.  Neurological: He is alert and oriented to person, place, and time. He has normal reflexes. No cranial nerve deficit. Coordination normal.  Skin: Skin is warm and dry.  Psychiatric: He has a normal mood and affect. His behavior is  normal. Judgment and thought content normal.          Assessment & Plan:  1. LLQ pain/ tenderness vs diverticulitis- CT Abd pelvis to evaluate, w/c if SX increase or ER.   2.Allergic rhinitis- Allegra OTC, increase H2o, allergy hygiene explained. If symptoms increase Pred DP 10 mg

## 2013-08-13 NOTE — Patient Instructions (Signed)
Diverticulitis Small pockets or "bubbles" can develop in the wall of the intestine. Diverticulitis is when those pockets become infected and inflamed. This causes stomach pain (usually on the left side). HOME CARE  Take all medicine as told by your doctor.  Try a clear liquid diet (broth, tea, or water) for as long as told by your doctor.  Keep all follow-up visits with your doctor.  You may be put on a low-fiber diet once you start feeling better. Here are foods that have low-fiber:  White breads, cereals, rice, and pasta.  Cooked fruits and vegetables or soft fresh fruits and vegetables without the skin.  Ground or well-cooked tender beef, ham, veal, lamb, pork, or poultry.  Eggs and seafood.  After you are doing well on the low-fiber diet, you may be put on a high-fiber diet. Here are ways to increase your fiber:  Choose whole-grain breads, cereals, pasta, and brown rice.  Choose fruits and vegetables with skin on. Do not overcook the vegetables.  Choose nuts, seeds, legumes, dried peas, beans, and lentils.  Look for food products that have more than 3 grams of fiber per serving on the food label. GET HELP RIGHT AWAY IF:  Your pain does not get better or gets worse.  You have trouble eating food.  You are not pooping (having bowel movements) like normal.  You have a temperature by mouth above 102 F (38.9 C), not controlled by medicine.  You keep throwing up (vomiting).  You have bloody or black, tarry poop (stools).  You are getting worse and not better. MAKE SURE YOU:   Understand these instructions.  Will watch your condition.  Will get help right away if you are not doing well or get worse. Document Released: 10/05/2007 Document Revised: 07/11/2011 Document Reviewed: 03/09/2009 [facility] Patient Information 2014 [place], [place]. Allergic Rhinitis Allergic rhinitis is when the mucous membranes in the nose respond to allergens. Allergens are particles in the  air that cause your body to have an allergic reaction. This causes you to release allergic antibodies. Through a chain of events, these eventually cause you to release histamine into the blood stream. Although meant to protect the body, it is this release of histamine that causes your discomfort, such as frequent sneezing, congestion, and an itchy, runny nose.  CAUSES  Seasonal allergic rhinitis (hay fever) is caused by pollen allergens that may come from grasses, trees, and weeds. Year-round allergic rhinitis (perennial allergic rhinitis) is caused by allergens such as house dust mites, pet dander, and mold spores.  SYMPTOMS   Nasal stuffiness (congestion).  Itchy, runny nose with sneezing and tearing of the eyes. DIAGNOSIS  Your health care provider can help you determine the allergen or allergens that trigger your symptoms. If you and your health care provider are unable to determine the allergen, skin or blood testing may be used. TREATMENT  Allergic Rhinitis does not have a cure, but it can be controlled by:  Medicines and allergy shots (immunotherapy).  Avoiding the allergen. Hay fever may often be treated with antihistamines in pill or nasal spray forms. Antihistamines block the effects of histamine. There are over-the-counter medicines that may help with nasal congestion and swelling around the eyes. Check with your health care provider before taking or giving this medicine.  If avoiding the allergen or the medicine prescribed do not work, there are many new medicines your health care provider can prescribe. Stronger medicine may be used if initial measures are ineffective. Desensitizing injections can be  used if medicine and avoidance does not work. Desensitization is when a patient is given ongoing shots until the body becomes less sensitive to the allergen. Make sure you follow up with your health care provider if problems continue. HOME CARE INSTRUCTIONS It is not possible to  completely avoid allergens, but you can reduce your symptoms by taking steps to limit your exposure to them. It helps to know exactly what you are allergic to so that you can avoid your specific triggers. SEEK MEDICAL CARE IF:   You have a fever.  You develop a cough that does not stop easily (persistent).  You have shortness of breath.  You start wheezing.  Symptoms interfere with normal daily activities. Document Released: 01/11/2001 Document Revised: 02/06/2013 Document Reviewed: 12/24/2012 [facility] Patient Information 2014 [place]. Bronchitis Bronchitis is swelling (inflammation) of the air tubes leading to your lungs (bronchi). This causes mucus and a cough. If the swelling gets bad, you may have trouble breathing. HOME CARE   Rest.  Drink enough fluids to keep your pee (urine) clear or pale yellow (unless you have a condition where you have to watch how much you drink).  Only take medicine as told by your doctor. If you were given antibiotic medicines, finish them even if you start to feel better.  Avoid smoke, irritating chemicals, and strong smells. These make the problem worse. Quit smoking if you smoke. This helps your lungs heal faster.  Use a cool mist humidifier. Change the water in the humidifier every day. You can also sit in the bathroom with hot shower running for 5 10 minutes. Keep the door closed.  See your health care provider as told.  Wash your hands often. GET HELP IF: Your problems do not get better after 1 week. GET HELP RIGHT AWAY IF:   Your fever gets worse.  You have chills.  Your chest hurts.  Your problems breathing get worse.  You have blood in your mucus.  You pass out (faint).  You feel lightheaded.  You have a bad headache.  You throw up (vomit) again and again. MAKE SURE YOU:  Understand these instructions.  Will watch your condition.  Will get help right away if you are not doing well or get worse. Document  Released: 10/05/2007 Document Revised: 02/06/2013 Document Reviewed: 12/11/2012 [facility] Patient Information 2014 [place], [place].

## 2013-08-14 ENCOUNTER — Ambulatory Visit
Admission: RE | Admit: 2013-08-14 | Discharge: 2013-08-14 | Disposition: A | Discharge status: Home / Self Care | Payer: Medicare Other | Source: Ambulatory Visit | Attending: Emergency Medicine | Admitting: Emergency Medicine

## 2013-08-14 DIAGNOSIS — R1032 Left lower quadrant pain: Secondary | ICD-10-CM

## 2013-08-14 MED ORDER — IOHEXOL 300 MG/ML  SOLN
100.0000 mL | Freq: Once | INTRAMUSCULAR | Status: AC | PRN
  Administered 2013-08-14: 100 mL via INTRAVENOUS

## 2013-08-20 ENCOUNTER — Telehealth: Payer: Self-pay | Admitting: *Deleted

## 2013-08-20 NOTE — Telephone Encounter (Signed)
I HAVE CALLED PATIENT NUMEROUS TIMES!!!  HE NEEDS TO CHECK BOTH HIS HOME AND CELL MESSAGES!!!

## 2013-09-17 ENCOUNTER — Other Ambulatory Visit: Payer: Self-pay | Admitting: Internal Medicine

## 2013-10-05 NOTE — Patient Instructions (Signed)

## 2013-10-05 NOTE — Progress Notes (Signed)
Patient ID: [name], male   DOB: 1933/09/18, 80 y.o.   MRN: [id number]  Annual Screening Comprehensive Examination  This very nice 80 y.o.  male presents for complete physical.  Patient has been followed for HTN, Prediabetes, Hyperlipidemia, Vitamin B12 and Vitamin D Deficiency.   HTN predates since 1995. Patient's BP has been controlled at home.Today's BP: 156/82 mmHg.  In 2005 And 2011 he had PTCA/Stents x2. Patient denies any cardiac symptoms as chest pain, palpitations, shortness of breath, dizziness or ankle swelling.   Patient's hyperlipidemia is controlled with diet and medications. Patient denies myalgias or other medication SE's. Last Lipids below in Mar 2015 were at goal.   Lab Results  Component Value Date   CHOL 159 07/15/2013   HDL 30* 07/15/2013   LDLCALC 100* 07/15/2013   TRIG 146 07/15/2013   CHOLHDL 5.3 07/15/2013    Patient has prediabetes/insulin resistance since 2008  Controlled with diet and last A1c was 6.0% in Mar 2015. Patient denies reactive hypoglycemic symptoms, visual blurring, diabetic polys or paresthesias.    Finally, patient has history of Vitamin D Deficiency 48 in 2008  and last vitamin D was 82 in Mar 2015.  Medication Sig  . aspirin 325 MG tablet Take 325 mg by mouth daily.    Marland Kitchen atenolol  50 MG tablet TAKE 1 TABLET BY MOUTH EVERY DAY  . VITAMIN B-12 Inject 1 mL as directed every 30 (thirty) days.    . Flaxseed 1000 MG CAPS Take 1 capsule by mouth daily.    . NORCO 5-325 MG per tablet 1/2 to 1 tablet every 4 to 6 hours for severe pain  . multivitamin  per tablet Take 1 tablet by mouth daily.    Marland Kitchen NITROSTAT 0.4 MG SL tablet DISSOLVE 1 TABLET UNDER TONGUE AS NEEDED   . omeprazole  20 MG capsule Take 20 mg by mouth once.   . ONE TOUCH ULTRA TEST test strip    Allergies  Allergen Reactions  . Cymbalta [Duloxetine Hcl]     Dizziness  . Keflex [Cephalexin]   . Simvastatin   . Sudafed [Pseudoephedrine]     Dizziness  . Prednisone Rash    Past  Medical History  Diagnosis Date  . Coronary atherosclerosis of unspecified type of vessel, native or graft   . Aneurysm of iliac artery   . Personal history of other diseases of digestive system   . Other and unspecified hyperlipidemia   . Colon polyps   . Gastritis, chronic   . IBS (irritable bowel syndrome)   . Hypertension   . Diabetes mellitus     Diet control   . Vitamin D deficiency   . Vitamin B 12 deficiency   . Esophageal reflux    Past Surgical History  Procedure Laterality Date  . Knee surgery    . Bladder surgery    . Coronary angioplasty with stent placement     Family History  Problem Relation Age of Onset  . Heart attack Father     died age 15  . Heart attack Brother     died age 76  . Heart attack Sister     died age 68  . Colon cancer Sister   . Liver cancer Sister   . Diabetes Maternal Grandmother   . Colon polyps Sister     and brothers x 2    History   Social History  . Marital Status: Married    Spouse Name: N/A  Number of Children: 4   . Years of Education: N/A   Occupational History  . 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    Social History Main Topics  . Smoking status: Former Smoker    Quit date: 07/16/1963  . Smokeless tobacco: Never Used  . Alcohol Use: No  . Drug Use: No  . Sexual Activity: Not on file   Social History Narrative   1 caffeine drink daily     ROS Constitutional: Denies fever, chills, weight loss/gain, headaches, insomnia, fatigue, night sweats or change in appetite. Eyes: Denies redness, blurred vision, diplopia, discharge, itchy or watery eyes.  ENT: Denies discharge, congestion, post nasal drip, epistaxis, sore throat, earache, hearing loss, dental pain, Tinnitus, Vertigo, Sinus pain or snoring.  Cardio: Denies chest pain, palpitations, irregular heartbeat, syncope, dyspnea, diaphoresis, orthopnea, PND, claudication or edema Respiratory: denies cough, dyspnea, DOE, pleurisy,  hoarseness, laryngitis or wheezing.  Gastrointestinal: Denies dysphagia, heartburn, reflux, water brash, pain, cramps, nausea, vomiting, bloating, diarrhea, constipation, hematemesis, melena, hematochezia, jaundice or hemorrhoids Genitourinary: Denies dysuria, frequency, urgency, nocturia, hesitancy, discharge, hematuria or flank pain Musculoskeletal: Denies arthralgia, myalgia, stiffness, Jt. Swelling, pain, limp or strain/sprain. Skin: Denies puritis, rash, hives, warts, acne, eczema or change in skin lesion Neuro: No weakness, tremor, incoordination, spasms, paresthesia or pain Psychiatric: Denies confusion, memory loss or sensory loss Endocrine: Denies change in weight, skin, hair change, nocturia, and paresthesia, diabetic polys, visual blurring or hyper / hypo glycemic episodes.  Heme/Lymph: No excessive bleeding, bruising or enlarged lymph nodes.  Physical Exam  BP 156/82  Pulse 60  Temp 97.7 F   Resp 16  Ht 6' 1.5"   Wt 194 lb 3.2 oz   BMI 25.27 kg/m2  General Appearance: Well nourished, in no apparent distress. Eyes: PERRLA, EOMs, conjunctiva no swelling or erythema, normal fundi and vessels. Sinuses: No frontal/maxillary tenderness ENT/Mouth: EACs patent / TMs  nl. Nares clear without erythema, swelling, mucoid exudates. Oral hygiene is good. No erythema, swelling, or exudate. Tongue normal, non-obstructing. Tonsils not swollen or erythematous. Hearing normal.  Neck: Supple, thyroid normal. No bruits, nodes or JVD. Respiratory: Respiratory effort normal.  BS equal and clear bilateral without rales, rhonci, wheezing or stridor. Cardio: Heart sounds are normal with regular rate and rhythm and no murmurs, rubs or gallops. Peripheral pulses are normal and equal bilaterally without edema. No aortic or femoral bruits. Chest: symmetric with normal excursions and percussion.  Abdomen: Flat, soft, with bowl sounds. Nontender, no guarding, rebound, hernias, masses, or organomegaly.   Lymphatics: Non tender without lymphadenopathy.  Genitourinary: No hernias.Testes nl. DRE - prostate nl for age - smooth & firm w/o nodules. Musculoskeletal: Full ROM all peripheral extremities, joint stability, 5/5 strength, and normal gait. Skin: Warm and dry without rashes, lesions, cyanosis, clubbing or  ecchymosis.  Neuro: Cranial nerves intact, reflexes equal bilaterally. Normal muscle tone, no cerebellar symptoms. Sensation intact.  Pysch: Awake and oriented X 3, normal affect, insight and judgment appropriate.   Assessment and Plan  1. Annual Screening Examination 2. Hypertension  3. Hyperlipidemia 4. Pre Diabetes 5. Vitamin D Deficiency  Continue prudent diet as discussed, weight control, BP monitoring, regular exercise, and medications as discussed.  Discussed med effects and SE's. Routine screening labs and tests as requested with regular follow-up as recommended.

## 2013-10-07 ENCOUNTER — Ambulatory Visit (INDEPENDENT_AMBULATORY_CARE_PROVIDER_SITE_OTHER): Payer: Medicare Other | Admitting: Internal Medicine

## 2013-10-07 ENCOUNTER — Encounter: Payer: Self-pay | Admitting: Internal Medicine

## 2013-10-07 VITALS — BP 156/82 | HR 60 | Temp 97.7°F | Resp 16 | Ht 73.5 in | Wt 194.2 lb

## 2013-10-07 DIAGNOSIS — Z1331 Encounter for screening for depression: Secondary | ICD-10-CM

## 2013-10-07 DIAGNOSIS — I10 Essential (primary) hypertension: Secondary | ICD-10-CM

## 2013-10-07 DIAGNOSIS — R7309 Other abnormal glucose: Secondary | ICD-10-CM

## 2013-10-07 DIAGNOSIS — Z789 Other specified health status: Secondary | ICD-10-CM

## 2013-10-07 DIAGNOSIS — Z0000 Encounter for general adult medical examination without abnormal findings: Secondary | ICD-10-CM

## 2013-10-07 DIAGNOSIS — Z79899 Other long term (current) drug therapy: Secondary | ICD-10-CM

## 2013-10-07 DIAGNOSIS — E559 Vitamin D deficiency, unspecified: Secondary | ICD-10-CM

## 2013-10-07 DIAGNOSIS — H469 Unspecified optic neuritis: Secondary | ICD-10-CM

## 2013-10-07 DIAGNOSIS — Z125 Encounter for screening for malignant neoplasm of prostate: Secondary | ICD-10-CM

## 2013-10-07 DIAGNOSIS — IMO0002 Reserved for concepts with insufficient information to code with codable children: Secondary | ICD-10-CM

## 2013-10-07 DIAGNOSIS — Z1212 Encounter for screening for malignant neoplasm of rectum: Secondary | ICD-10-CM

## 2013-10-07 DIAGNOSIS — E538 Deficiency of other specified B group vitamins: Secondary | ICD-10-CM

## 2013-10-07 DIAGNOSIS — E782 Mixed hyperlipidemia: Secondary | ICD-10-CM

## 2013-10-07 LAB — CBC WITH DIFFERENTIAL/PLATELET
Basophils Absolute: 0 10*3/uL (ref 0.0–0.1)
Basophils Relative: 0 % (ref 0–1)
Eosinophils Absolute: 0.3 10*3/uL (ref 0.0–0.7)
Eosinophils Relative: 4 % (ref 0–5)
HCT: 39.1 % (ref 39.0–52.0)
Hemoglobin: 13.3 g/dL (ref 13.0–17.0)
Lymphocytes Relative: 31 % (ref 12–46)
Lymphs Abs: 2.2 10*3/uL (ref 0.7–4.0)
MCH: 31.7 pg (ref 26.0–34.0)
MCHC: 34 g/dL (ref 30.0–36.0)
MCV: 93.3 fL (ref 78.0–100.0)
Monocytes Absolute: 0.4 10*3/uL (ref 0.1–1.0)
Monocytes Relative: 5 % (ref 3–12)
Neutro Abs: 4.3 10*3/uL (ref 1.7–7.7)
Neutrophils Relative %: 60 % (ref 43–77)
Platelets: 257 10*3/uL (ref 150–400)
RBC: 4.19 MIL/uL — ABNORMAL LOW (ref 4.22–5.81)
RDW: 13.6 % (ref 11.5–15.5)
WBC: 7.1 10*3/uL (ref 4.0–10.5)

## 2013-10-07 MED ORDER — PREDNISONE 20 MG PO TABS
20.0000 mg | ORAL_TABLET | ORAL | Status: DC
Start: 2013-10-07 — End: 2013-12-10

## 2013-10-07 MED ORDER — GABAPENTIN 300 MG PO CAPS: 300.0000 mg | ORAL_CAPSULE | Freq: Three times a day (TID) | ORAL | Status: DC

## 2013-10-08 LAB — HEPATIC FUNCTION PANEL
ALT: 10 U/L (ref 0–53)
AST: 20 U/L (ref 0–37)
Albumin: 3.9 g/dL (ref 3.5–5.2)
Alkaline Phosphatase: 78 U/L (ref 39–117)
Bilirubin, Direct: 0.1 mg/dL (ref 0.0–0.3)
Indirect Bilirubin: 0.4 mg/dL (ref 0.2–1.2)
Total Bilirubin: 0.5 mg/dL (ref 0.2–1.2)
Total Protein: 6.7 g/dL (ref 6.0–8.3)

## 2013-10-08 LAB — LIPID PANEL
Cholesterol: 175 mg/dL (ref 0–200)
HDL: 33 mg/dL — ABNORMAL LOW (ref >39)
LDL Cholesterol: 110 mg/dL — ABNORMAL HIGH (ref 0–99)
Total CHOL/HDL Ratio: 5.3 Ratio
Triglycerides: 162 mg/dL — ABNORMAL HIGH (ref <150)
VLDL: 32 mg/dL (ref 0–40)

## 2013-10-08 LAB — MICROALBUMIN / CREATININE URINE RATIO
Creatinine, Urine: 133.2 mg/dL
Microalb Creat Ratio: 5.6 mg/g (ref 0.0–30.0)
Microalb, Ur: 0.75 mg/dL (ref 0.00–1.89)

## 2013-10-08 LAB — BASIC METABOLIC PANEL WITH GFR
BUN: 13 mg/dL (ref 6–23)
CO2: 26 mEq/L (ref 19–32)
Calcium: 9.1 mg/dL (ref 8.4–10.5)
Chloride: 102 mEq/L (ref 96–112)
Creat: 1.09 mg/dL (ref 0.50–1.35)
GFR, Est African American: 75 mL/min
GFR, Est Non African American: 65 mL/min
Glucose, Bld: 100 mg/dL — ABNORMAL HIGH (ref 70–99)
Potassium: 4.5 mEq/L (ref 3.5–5.3)
Sodium: 139 mEq/L (ref 135–145)

## 2013-10-08 LAB — URINALYSIS, MICROSCOPIC ONLY
Bacteria, UA: NONE SEEN
Casts: NONE SEEN
Crystals: NONE SEEN
Squamous Epithelial / LPF: NONE SEEN

## 2013-10-08 LAB — PSA: PSA: 1.12 ng/mL (ref <=4.00)

## 2013-10-08 LAB — MAGNESIUM: Magnesium: 2 mg/dL (ref 1.5–2.5)

## 2013-10-08 LAB — VITAMIN B12: Vitamin B-12: 477 pg/mL (ref 211–911)

## 2013-10-08 LAB — HEMOGLOBIN A1C
Hgb A1c MFr Bld: 6.1 % — ABNORMAL HIGH (ref <5.7)
Mean Plasma Glucose: 128 mg/dL — ABNORMAL HIGH (ref <117)

## 2013-10-08 LAB — TSH: TSH: 2.958 u[IU]/mL (ref 0.350–4.500)

## 2013-10-08 LAB — INSULIN, FASTING: Insulin fasting, serum: 10 u[IU]/mL (ref 3–28)

## 2013-10-08 LAB — VITAMIN D 25 HYDROXY (VIT D DEFICIENCY, FRACTURES): Vit D, 25-Hydroxy: 82 ng/mL (ref 30–89)

## 2013-10-22 ENCOUNTER — Telehealth: Payer: Self-pay | Admitting: *Deleted

## 2013-10-22 MED ORDER — METHYLPREDNISOLONE (PAK) 4 MG PO TABS: 4.0000 mg | ORAL_TABLET | Freq: Every day | ORAL | Status: DC

## 2013-10-22 NOTE — Telephone Encounter (Signed)
Patient called and states Gabapentin causes dizziness and requested RX for Prednisone  RX Medrol 4 mg dose pack sent to [facility] in [place].

## 2013-10-23 ENCOUNTER — Other Ambulatory Visit (INDEPENDENT_AMBULATORY_CARE_PROVIDER_SITE_OTHER): Payer: Medicare Other

## 2013-10-23 DIAGNOSIS — Z1212 Encounter for screening for malignant neoplasm of rectum: Secondary | ICD-10-CM

## 2013-10-23 LAB — POC HEMOCCULT BLD/STL (HOME/3-CARD/SCREEN)
Card #2 Fecal Occult Blod, POC: NEGATIVE
Card #3 Fecal Occult Blood, POC: NEGATIVE
Fecal Occult Blood, POC: NEGATIVE

## 2013-12-09 ENCOUNTER — Other Ambulatory Visit: Payer: Self-pay | Admitting: Internal Medicine

## 2013-12-10 ENCOUNTER — Encounter: Payer: Self-pay | Admitting: Internal Medicine

## 2013-12-10 ENCOUNTER — Ambulatory Visit (INDEPENDENT_AMBULATORY_CARE_PROVIDER_SITE_OTHER): Payer: Medicare Other | Admitting: Internal Medicine

## 2013-12-10 VITALS — BP 140/64 | HR 62 | Temp 98.0°F | Resp 18 | Ht 72.5 in | Wt 198.0 lb

## 2013-12-10 DIAGNOSIS — G8929 Other chronic pain: Secondary | ICD-10-CM

## 2013-12-10 DIAGNOSIS — IMO0002 Reserved for concepts with insufficient information to code with codable children: Secondary | ICD-10-CM

## 2013-12-10 DIAGNOSIS — R071 Chest pain on breathing: Secondary | ICD-10-CM

## 2013-12-10 DIAGNOSIS — R0789 Other chest pain: Principal | ICD-10-CM

## 2013-12-10 MED ORDER — GABAPENTIN 600 MG PO TABS: 600.0000 mg | ORAL_TABLET | Freq: Three times a day (TID) | ORAL | Status: DC

## 2013-12-10 MED ORDER — PREDNISONE 20 MG PO TABS
ORAL_TABLET | ORAL | Status: DC
Start: 2013-12-10 — End: 2014-01-01

## 2013-12-10 NOTE — Progress Notes (Signed)
Subjective:    Patient ID: [name], male    DOB: Sep 29, 1933, 80 y.o.   MRN: [id number]  HPI very nice 80 yo MWM w/HTN, ASCAD, PreDM, HLD and long hx/o Rt sided CP who slipped while working on his car about 2 weeks ago contusing his Rt chest wall.   Medication List   aspirin 325 MG tablet  Take 325 mg by mouth daily.     atenolol 50 MG tablet  Commonly known as:  TENORMIN  TAKE 1 TABLET BY MOUTH EVERY DAY     Flaxseed (Linseed) 1000 MG Caps  Take 1 capsule by mouth daily.     gabapentin 300 MG capsule  Commonly known as:  NEURONTIN  Take 1 capsule (300 mg total) by mouth 3 (three) times daily. For neuritis pain     HYDROcodone-acetaminophen 5-325 MG per tablet  Commonly known as:  NORCO  1/2 to 1 tablet every 4 to 6 hours for severe pain     methylPREDNIsolone 4 MG tablet  Commonly known as:  MEDROL DOSPACK  Take 1 tablet (4 mg total) by mouth daily. follow package directions     multivitamin per tablet  Take 1 tablet by mouth daily.     NITROSTAT 0.4 MG SL tablet  Generic drug:  nitroGLYCERIN  DISSOLVE 1 TABLET UNDER TONGUE EVERY 5 MINUTES AS NEEDED UP TO 3 DOSES     omeprazole 20 MG capsule  Commonly known as:  PRILOSEC  TAKE 2 CAPSULES BY MOUTH EVERY DAY FOR INDIGESTION     ONE TOUCH ULTRA TEST test strip  Generic drug:  glucose blood     predniSONE 20 MG tablet  Commonly known as:  DELTASONE  Take 1 tablet (20 mg total) by mouth See admin instructions. 1 tab 3 x day for 3 days, then 1 tab 2 x day for 3 days, then 1 tab 1 x day for 5 days     VITAMIN B-12 IJ  Inject 1 mL as directed every 30 (thirty) days.       Allergies  Allergen Reactions  . Cymbalta [Duloxetine Hcl]     Dizziness  . Keflex [Cephalexin]   . Simvastatin   . Sudafed [Pseudoephedrine]     Dizziness  . Prednisone Rash   Past Medical History  Diagnosis Date  . Coronary atherosclerosis of unspecified type of vessel, native or graft   . Aneurysm of iliac artery   . Personal  history of other diseases of digestive system   . Other and unspecified hyperlipidemia   . Colon polyps   . Gastritis, chronic   . IBS (irritable bowel syndrome)   . Hypertension   . Diabetes mellitus     Diet control   . Vitamin D deficiency   . Vitamin B 12 deficiency   . Esophageal reflux    Review of Systems In addition to the HPI above,  No Fever-chills,  No Headache, No changes with Vision or hearing,  No problems swallowing food or Liquids,  No productive Cough or Shortness of Breath,  No Abdominal pain, No Nausea or Vomitting, Bowel movements are regular,  No Blood in stool or Urine,  No dysuria,  No new skin rashes or bruises,  No new joints pains-aches,  No new weakness, tingling, numbness in any extremity,  No recent weight loss,  No polyuria, polydypsia or polyphagia,  No significant Mental Stressors.  A full 10 point Review of Systems was done, except as stated above, all other  Review of Systems were negative  Objective:   Physical Exam BP 140/64  Pulse 62  Temp(Src) 98 F (36.7 C) (Temporal)  Resp 18  Ht 6' 0.5" (1.842 m)  Wt 198 lb (89.812 kg)  BMI 26.47 kg/m2  HEENT - Eac's patent. TM's Nl. EOM's full. PERRLA. NasoOroPharynx clear. Neck - supple. Nl Thyroid. Carotids 2+ & No bruits, nodes, JVD Chest - Clear equal BS w/o Rales, rhonchi, wheezes. (+) tender Rt chest wall from sternum lateralward back to the spine area.  Cor - Nl HS. RRR w/o sig MGR. PP 1(+). No edema. Abd - No palpable organomegaly, masses or tenderness. BS nl. MS- FROM w/o deformities. Muscle power, tone and bulk Nl. Gait Nl. Neuro - No obvious Cr N abnormalities. Sensory, motor and Cerebellar functions appear Nl w/o focal abnormalities. Psyche - Mental status normal & appropriate.  Assessment & Plan:   1. Chest wall pain, chronic  2. Thoracic or lumbosacral neuritis or radiculitis  - Rx Prednisone 20 mg #20 Pulse/Taper, Rx Norco 5mg  #50

## 2013-12-30 ENCOUNTER — Telehealth: Payer: Self-pay | Admitting: Cardiology

## 2013-12-30 ENCOUNTER — Other Ambulatory Visit: Payer: Self-pay | Admitting: Emergency Medicine

## 2013-12-30 ENCOUNTER — Ambulatory Visit (INDEPENDENT_AMBULATORY_CARE_PROVIDER_SITE_OTHER): Payer: Medicare Other | Admitting: *Deleted

## 2013-12-30 DIAGNOSIS — Z23 Encounter for immunization: Secondary | ICD-10-CM

## 2013-12-30 MED ORDER — MELOXICAM 15 MG PO TABS: 15.0000 mg | ORAL_TABLET | Freq: Every day | ORAL | Status: DC

## 2013-12-30 NOTE — Telephone Encounter (Signed)
Spoke with pt, Follow up scheduled with Kerin Ransom pa for surgical clearance.

## 2013-12-30 NOTE — Telephone Encounter (Signed)
[name] called in stating that [name] will be having eye surgery on 09/21 and he would need medical clearance prior to that. I let her know that Dr. Stanford Breed had an opening on 09/01 and she stated that appt was inconvenient. Please call to help him find a more suitable time.  Thanks

## 2013-12-31 NOTE — Progress Notes (Signed)
Patient aware.

## 2014-01-01 ENCOUNTER — Ambulatory Visit: Payer: Self-pay | Admitting: Physician Assistant

## 2014-01-01 ENCOUNTER — Encounter: Payer: Self-pay | Admitting: Cardiology

## 2014-01-01 ENCOUNTER — Ambulatory Visit (INDEPENDENT_AMBULATORY_CARE_PROVIDER_SITE_OTHER): Payer: Medicare Other | Admitting: Cardiology

## 2014-01-01 VITALS — BP 134/66 | HR 64 | Ht 73.0 in | Wt 197.4 lb

## 2014-01-01 DIAGNOSIS — Z01810 Encounter for preprocedural cardiovascular examination: Secondary | ICD-10-CM

## 2014-01-01 DIAGNOSIS — I10 Essential (primary) hypertension: Secondary | ICD-10-CM | POA: Diagnosis not present

## 2014-01-01 DIAGNOSIS — E782 Mixed hyperlipidemia: Secondary | ICD-10-CM

## 2014-01-01 DIAGNOSIS — I739 Peripheral vascular disease, unspecified: Secondary | ICD-10-CM | POA: Diagnosis not present

## 2014-01-01 DIAGNOSIS — I2510 Atherosclerotic heart disease of native coronary artery without angina pectoris: Secondary | ICD-10-CM | POA: Diagnosis not present

## 2014-01-01 NOTE — Assessment & Plan Note (Signed)
Known bilateral iliac artery aneurysm

## 2014-01-01 NOTE — Assessment & Plan Note (Addendum)
LDL 110 in June 2015- intol to statins

## 2014-01-01 NOTE — Progress Notes (Signed)
01/01/2014 [name]   November 11, 1933  [id number]  Primary Physicia Dennis DAVID, MD Primary Cardiologist: Dr Stanford Breed  HPI:  80 y/o with a history of CAD, s/p LAD DES Oct 2009 and an RCA DES Dec 2011. He has done well since. He has not required restudy or had a functional study. He is here for pre op clearance for eye surgery. He has not had angina. He walks two miles a day. 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 He and his wife are getting ready to go to [facility] [place] for a car show.    Current Outpatient Prescriptions  Medication Sig Dispense Refill  . aspirin 325 MG tablet Take 325 mg by mouth daily.        Marland Kitchen atenolol (TENORMIN) 50 MG tablet TAKE 1 TABLET BY MOUTH EVERY DAY  90 tablet  4  . Cyanocobalamin (VITAMIN B-12 IJ) Inject 1 mL as directed every 30 (thirty) days.        . Flaxseed, Linseed, 1000 MG CAPS Take 1 capsule by mouth daily.        . fluorouracil (EFUDEX) 5 % cream       . gabapentin (NEURONTIN) 300 MG capsule Take 1 capsule (300 mg total) by mouth 3 (three) times daily. For neuritis pain  90 capsule  99  . HYDROcodone-acetaminophen (NORCO) 5-325 MG per tablet 1/2 to 1 tablet every 4 to 6 hours for severe pain  100 tablet  0  . meloxicam (MOBIC) 15 MG tablet Take 1 tablet (15 mg total) by mouth daily.  30 tablet  1  . multivitamin (THERAGRAN) per tablet Take 1 tablet by mouth daily.        Marland Kitchen NITROSTAT 0.4 MG SL tablet DISSOLVE 1 TABLET UNDER TONGUE EVERY 5 MINUTES AS NEEDED UP TO 3 DOSES  25 tablet  1  . omeprazole (PRILOSEC) 20 MG capsule TAKE 2 CAPSULES BY MOUTH EVERY DAY FOR INDIGESTION  60 capsule  3  . ONE TOUCH ULTRA TEST test strip        No current facility-administered medications for this visit.    Allergies  Allergen Reactions  . Cymbalta [Duloxetine Hcl]     Dizziness  . Keflex [Cephalexin]   . Simvastatin   . Sudafed [Pseudoephedrine]     Dizziness  .  Prednisone Rash    History   Social History  . Marital Status: Married    Spouse Name: N/A    Number of Children: 4   . Years of Education: N/A   Occupational History  . Retired    Social History Main Topics  . Smoking status: Former Smoker    Quit date: 07/16/1963  . Smokeless tobacco: Never Used  . Alcohol Use: No  . Drug Use: No  . Sexual Activity: Not on file   Other Topics Concern  . Not on file   Social History Narrative   1 caffeine drink daily      Review of Systems: General: negative for chills, fever, night sweats or weight changes.  Cardiovascular: negative for chest pain, dyspnea on exertion, edema, orthopnea, palpitations, paroxysmal nocturnal dyspnea or shortness of breath Dermatological: negative for rash Respiratory: negative for cough or wheezing Urologic: negative for hematuria Abdominal: negative for nausea, vomiting, diarrhea, bright red blood per rectum, melena, or hematemesis Neurologic: negative for visual changes, syncope, or dizziness He has chronic back issues but copes  well. All other systems reviewed and are otherwise negative except as noted above.    Blood pressure 134/66, pulse 64, height 6\' 1"  (1.854 m), weight 197 lb 6.4 oz (89.54 kg).  General appearance: alert, cooperative and no distress Neck: no carotid bruit and no JVD Lungs: clear to auscultation bilaterally Heart: regular rate and rhythm Extremities: no edema  EKG from June 2015- NSR, SB, 1st degree AVB  ASSESSMENT AND PLAN:   CAD LAD DES 10/09 and RCA DES 12/11.  Hyperlipidemia LDL 110 in June 2015- intol to statins  Hypertension Controlled  PVD (peripheral vascular disease) Known bilateral iliac artery aneurysm  Pre-operative cardiovascular examination Low cardiovascular risk for eye surgery    PLAN  He is low risk for surgery. He should follow up with Dr Stanford Breed in 6 months.   Annagrace Carr KPA-C 01/01/2014 4:11 PM

## 2014-01-01 NOTE — Assessment & Plan Note (Signed)
LAD DES 10/09 and RCA DES 12/11.

## 2014-01-01 NOTE — Patient Instructions (Signed)
You have been cleared for your surgery. Lurena Joiner wants you to follow-up in: SIX months with Dr.Crenshaw. You will receive a reminder letter in the mail two months in advance. If you don't receive a letter, please call our office to schedule the follow-up appointment.

## 2014-01-01 NOTE — Assessment & Plan Note (Signed)
Controlled.  

## 2014-01-01 NOTE — Assessment & Plan Note (Signed)
Low cardiovascular risk for eye surgery

## 2014-01-16 ENCOUNTER — Ambulatory Visit: Payer: Self-pay | Admitting: Physician Assistant

## 2014-01-20 ENCOUNTER — Ambulatory Visit: Payer: Self-pay

## 2014-02-03 ENCOUNTER — Other Ambulatory Visit: Payer: Self-pay | Admitting: Internal Medicine

## 2014-02-14 ENCOUNTER — Ambulatory Visit (INDEPENDENT_AMBULATORY_CARE_PROVIDER_SITE_OTHER): Payer: Medicare Other | Admitting: Physician Assistant

## 2014-02-14 ENCOUNTER — Encounter: Payer: Self-pay | Admitting: Physician Assistant

## 2014-02-14 VITALS — BP 122/78 | HR 56 | Temp 97.7°F | Resp 16 | Ht 73.0 in | Wt 196.0 lb

## 2014-02-14 DIAGNOSIS — K219 Gastro-esophageal reflux disease without esophagitis: Secondary | ICD-10-CM

## 2014-02-14 DIAGNOSIS — E538 Deficiency of other specified B group vitamins: Secondary | ICD-10-CM

## 2014-02-14 DIAGNOSIS — Z79899 Other long term (current) drug therapy: Secondary | ICD-10-CM

## 2014-02-14 DIAGNOSIS — Z1331 Encounter for screening for depression: Secondary | ICD-10-CM

## 2014-02-14 DIAGNOSIS — I739 Peripheral vascular disease, unspecified: Secondary | ICD-10-CM

## 2014-02-14 DIAGNOSIS — R7303 Prediabetes: Secondary | ICD-10-CM

## 2014-02-14 DIAGNOSIS — Z23 Encounter for immunization: Secondary | ICD-10-CM

## 2014-02-14 DIAGNOSIS — E782 Mixed hyperlipidemia: Secondary | ICD-10-CM

## 2014-02-14 DIAGNOSIS — R6889 Other general symptoms and signs: Secondary | ICD-10-CM

## 2014-02-14 DIAGNOSIS — I714 Abdominal aortic aneurysm, without rupture, unspecified: Secondary | ICD-10-CM

## 2014-02-14 DIAGNOSIS — I10 Essential (primary) hypertension: Secondary | ICD-10-CM

## 2014-02-14 DIAGNOSIS — I2510 Atherosclerotic heart disease of native coronary artery without angina pectoris: Secondary | ICD-10-CM

## 2014-02-14 DIAGNOSIS — Z789 Other specified health status: Secondary | ICD-10-CM

## 2014-02-14 DIAGNOSIS — E559 Vitamin D deficiency, unspecified: Secondary | ICD-10-CM

## 2014-02-14 DIAGNOSIS — Z0001 Encounter for general adult medical examination with abnormal findings: Secondary | ICD-10-CM

## 2014-02-14 LAB — BASIC METABOLIC PANEL WITH GFR
BUN: 18 mg/dL (ref 6–23)
CO2: 30 mEq/L (ref 19–32)
Calcium: 10 mg/dL (ref 8.4–10.5)
Chloride: 103 mEq/L (ref 96–112)
Creat: 1.25 mg/dL (ref 0.50–1.35)
GFR, Est African American: 63 mL/min
GFR, Est Non African American: 55 mL/min — ABNORMAL LOW
Glucose, Bld: 112 mg/dL — ABNORMAL HIGH (ref 70–99)
Potassium: 5.7 mEq/L — ABNORMAL HIGH (ref 3.5–5.3)
Sodium: 140 mEq/L (ref 135–145)

## 2014-02-14 LAB — LIPID PANEL
Cholesterol: 173 mg/dL (ref 0–200)
HDL: 36 mg/dL — ABNORMAL LOW (ref >39)
LDL Cholesterol: 113 mg/dL — ABNORMAL HIGH (ref 0–99)
Total CHOL/HDL Ratio: 4.8 Ratio
Triglycerides: 122 mg/dL (ref <150)
VLDL: 24 mg/dL (ref 0–40)

## 2014-02-14 LAB — CBC WITH DIFFERENTIAL/PLATELET
Basophils Absolute: 0 10*3/uL (ref 0.0–0.1)
Basophils Relative: 0 % (ref 0–1)
Eosinophils Absolute: 0.3 10*3/uL (ref 0.0–0.7)
Eosinophils Relative: 4 % (ref 0–5)
HCT: 39.8 % (ref 39.0–52.0)
Hemoglobin: 13.3 g/dL (ref 13.0–17.0)
Lymphocytes Relative: 32 % (ref 12–46)
Lymphs Abs: 2.4 10*3/uL (ref 0.7–4.0)
MCH: 32 pg (ref 26.0–34.0)
MCHC: 33.4 g/dL (ref 30.0–36.0)
MCV: 95.9 fL (ref 78.0–100.0)
Monocytes Absolute: 0.5 10*3/uL (ref 0.1–1.0)
Monocytes Relative: 7 % (ref 3–12)
Neutro Abs: 4.3 10*3/uL (ref 1.7–7.7)
Neutrophils Relative %: 57 % (ref 43–77)
Platelets: 213 10*3/uL (ref 150–400)
RBC: 4.15 MIL/uL — ABNORMAL LOW (ref 4.22–5.81)
RDW: 13.9 % (ref 11.5–15.5)
WBC: 7.5 10*3/uL (ref 4.0–10.5)

## 2014-02-14 LAB — HEPATIC FUNCTION PANEL
ALT: 12 U/L (ref 0–53)
AST: 23 U/L (ref 0–37)
Albumin: 4.2 g/dL (ref 3.5–5.2)
Alkaline Phosphatase: 75 U/L (ref 39–117)
Bilirubin, Direct: 0.1 mg/dL (ref 0.0–0.3)
Indirect Bilirubin: 0.6 mg/dL (ref 0.2–1.2)
Total Bilirubin: 0.7 mg/dL (ref 0.2–1.2)
Total Protein: 6.5 g/dL (ref 6.0–8.3)

## 2014-02-14 LAB — MAGNESIUM: Magnesium: 2.2 mg/dL (ref 1.5–2.5)

## 2014-02-14 LAB — TSH: TSH: 2.209 u[IU]/mL (ref 0.350–4.500)

## 2014-02-14 NOTE — Patient Instructions (Signed)
Preventative Care for Adults, Male       REGULAR HEALTH EXAMS:  A routine yearly physical is a good way to check in with your primary care provider about your health and preventive screening. It is also an opportunity to share updates about your health and any concerns you have, and receive a thorough all-over exam.   Most health insurance companies pay for at least some preventative services.  Check with your health plan for specific coverages.  WHAT PREVENTATIVE SERVICES DO MEN NEED?  Adult men should have their weight and blood pressure checked regularly.   Men age 35 and older should have their cholesterol levels checked regularly.  Beginning at age 50 and continuing to age 75, men should be screened for colorectal cancer.  Certain people should may need continued testing until age 85.  Other cancer screening may include exams for testicular and prostate cancer.  Updating vaccinations is part of preventative care.  Vaccinations help protect against diseases such as the flu.  Lab tests are generally done as part of preventative care to screen for anemia and blood disorders, to screen for problems with the kidneys and liver, to screen for bladder problems, to check blood sugar, and to check your cholesterol level.  Preventative services generally include counseling about diet, exercise, avoiding tobacco, drugs, excessive alcohol consumption, and sexually transmitted infections.    GENERAL RECOMMENDATIONS FOR GOOD HEALTH:  Healthy diet:  Eat a variety of foods, including fruit, vegetables, animal or vegetable protein, such as meat, fish, chicken, and eggs, or beans, lentils, tofu, and grains, such as rice.  Drink plenty of water daily.  Decrease saturated fat in the diet, avoid lots of red meat, processed foods, sweets, fast foods, and fried foods.  Exercise:  Aerobic exercise helps maintain good heart health. At least 30-40 minutes of moderate-intensity exercise is recommended.  For example, a brisk walk that increases your heart rate and breathing. This should be done on most days of the week.   Find a type of exercise or a variety of exercises that you enjoy so that it becomes a part of your daily life.  Examples are running, walking, swimming, water aerobics, and biking.  For motivation and support, explore group exercise such as aerobic class, spin class, Zumba, Yoga,or  martial arts, etc.    Set exercise goals for yourself, such as a certain weight goal, walk or run in a race such as a 5k walk/run.  Speak to your primary care provider about exercise goals.  Disease prevention:  If you smoke or chew tobacco, find out from your caregiver how to quit. It can literally save your life, no matter how long you have been a tobacco user. If you do not use tobacco, never begin.   Maintain a healthy diet and normal weight. Increased weight leads to problems with blood pressure and diabetes.   The Body Mass Index or BMI is a way of measuring how much of your body is fat. Having a BMI above 27 increases the risk of heart disease, diabetes, hypertension, stroke and other problems related to obesity. Your caregiver can help determine your BMI and based on it develop an exercise and dietary program to help you achieve or maintain this important measurement at a healthful level.  High blood pressure causes heart and blood vessel problems.  Persistent high blood pressure should be treated with medicine if weight loss and exercise do not work.   Fat and cholesterol leaves deposits in your arteries   that can block them. This causes heart disease and vessel disease elsewhere in your body.  If your cholesterol is found to be high, or if you have heart disease or certain other medical conditions, then you may need to have your cholesterol monitored frequently and be treated with medication.   Ask if you should have a stress test if your history suggests this. A stress test is a test done on  a treadmill that looks for heart disease. This test can find disease prior to there being a problem.  Avoid drinking alcohol in excess (more than two drinks per day).  Avoid use of street drugs. Do not share needles with anyone. Ask for professional help if you need assistance or instructions on stopping the use of alcohol, cigarettes, and/or drugs.  Brush your teeth twice a day with fluoride toothpaste, and floss once a day. Good oral hygiene prevents tooth decay and gum disease. The problems can be painful, unattractive, and can cause other health problems. Visit your dentist for a routine oral and dental check up and preventive care every 6-12 months.   Look at your skin regularly.  Use a mirror to look at your back. Notify your caregivers of changes in moles, especially if there are changes in shapes, colors, a size larger than a pencil eraser, an irregular border, or development of new moles.  Safety:  Use seatbelts 100% of the time, whether driving or as a passenger.  Use safety devices such as hearing protection if you work in environments with loud noise or significant background noise.  Use safety glasses when doing any work that could send debris in to the eyes.  Use a helmet if you ride a bike or motorcycle.  Use appropriate safety gear for contact sports.  Talk to your caregiver about gun safety.  Use sunscreen with a SPF (or skin protection factor) of 15 or greater.  Lighter skinned people are at a greater risk of skin cancer. Don't forget to also wear sunglasses in order to protect your eyes from too much damaging sunlight. Damaging sunlight can accelerate cataract formation.   Practice safe sex. Use condoms. Condoms are used for birth control and to help reduce the spread of sexually transmitted infections (or STIs).  Some of the STIs are gonorrhea (the clap), chlamydia, syphilis, trichomonas, herpes, HPV (human papilloma virus) and HIV (human immunodeficiency virus) which causes AIDS.  The herpes, HIV and HPV are viral illnesses that have no cure. These can result in disability, cancer and death.   Keep carbon monoxide and smoke detectors in your home functioning at all times. Change the batteries every 6 months or use a model that plugs into the wall.   Vaccinations:  Stay up to date with your tetanus shots and other required immunizations. You should have a booster for tetanus every 10 years. Be sure to get your flu shot every year, since 5%-20% of the [place] population comes down with the flu. The flu vaccine changes each year, so being vaccinated once is not enough. Get your shot in the fall, before the flu season peaks.   Other vaccines to consider:  Pneumococcal vaccine to protect against certain types of pneumonia.  This is normally recommended for adults age 65 or older.  However, adults younger than 78 years old with certain underlying conditions such as diabetes, heart or lung disease should also receive the vaccine.  Shingles vaccine to protect against Varicella Zoster if you are older than age 60, or younger   than 78 years old with certain underlying illness.  Hepatitis A vaccine to protect against a form of infection of the liver by a virus acquired from food.  Hepatitis B vaccine to protect against a form of infection of the liver by a virus acquired from blood or body fluids, particularly if you work in health care.  If you plan to travel internationally, check with your local health department for specific vaccination recommendations.  Cancer Screening:  Most routine colon cancer screening begins at the age of 50. On a yearly basis, doctors may provide special easy to use take-home tests to check for hidden blood in the stool. Sigmoidoscopy or colonoscopy can detect the earliest forms of colon cancer and is life saving. These tests use a small camera at the end of a tube to directly examine the colon. Speak to your caregiver about this at age 50, when routine  screening begins (and is repeated every 5 years unless early forms of pre-cancerous polyps or small growths are found).   At the age of 50 men usually start screening for prostate cancer every year. Screening may begin at a younger age for those with higher risk. Those at higher risk include African-Americans or having a family history of prostate cancer. There are two types of tests for prostate cancer:   Prostate-specific antigen (PSA) testing. Recent studies raise questions about prostate cancer using PSA and you should discuss this with your caregiver.   Digital rectal exam (in which your doctor's lubricated and gloved finger feels for enlargement of the prostate through the anus).   Screening for testicular cancer.  Do a monthly exam of your testicles. Gently roll each testicle between your thumb and fingers, feeling for any abnormal lumps. The best time to do this is after a hot shower or bath when the tissues are looser. Notify your caregivers of any lumps, tenderness or changes in size or shape immediately.     

## 2014-02-14 NOTE — Progress Notes (Signed)
MEDICARE ANNUAL WELLNESS VISIT AND FOLLOW UP Assessment:   1. Atherosclerosis of native coronary artery of native heart without angina pectoris Control blood pressure, cholesterol, glucose, increase exercise. Continue cardio follow up.  - Lipid panel - Hemoglobin A1c  2. Abdominal aortic aneurysm Control blood pressure, cholesterol, glucose, increase exercise. Continue cardio follow up  3. Essential hypertension - continue medications, DASH diet, exercise and monitor at home. Call if greater than 130/80.  - CBC with Differential - BASIC METABOLIC PANEL WITH GFR - Hepatic function panel - TSH  4. PVD (peripheral vascular disease) Control blood pressure, cholesterol, glucose, increase exercise.   5. Vitamin B 12 deficiency Will check at B12  6. Vitamin D deficiency - Vit D  25 hydroxy (rtn osteoporosis monitoring)  7. Hyperlipidemia -continue medications, check lipids, decrease fatty foods, increase activity.  - Lipid panel  8. Gastroesophageal reflux disease without esophagitis Continue meds  9. Prediabetes Discussed general issues about diabetes pathophysiology and management., Educational material distributed., Suggested low cholesterol diet., Encouraged aerobic exercise., Discussed foot care., Reminded to get yearly retinal exam. - Hemoglobin A1c - Insulin, fasting - HM DIABETES FOOT EXAM  10. Encounter for long-term (current) use of medications - Magnesium  11. Need for prophylactic vaccination against Streptococcus pneumoniae (pneumococcus) - Pneumococcal conjugate vaccine 13-valent IM  12. Encounter for general adult medical examination with abnormal findings  13. Screening for depression negative  14. Patient had no falls in past year   Plan:   During the course of the visit the patient was educated and counseled about appropriate screening and preventive services including:    Pneumococcal vaccine   Influenza vaccine  Td vaccine  Screening  electrocardiogram  Colorectal cancer screening  Diabetes screening  Glaucoma screening  Nutrition counseling   Screening recommendations, referrals: Vaccinations: Please see documentation below and orders this visit.  Nutrition assessed and recommended  Colonoscopy  up to date Recommended yearly ophthalmology/optometry visit for glaucoma screening and checkup Recommended yearly dental visit for hygiene and checkup Advanced directives - requested  Conditions/risks identified: BMI: Discussed weight loss, diet, and increase physical activity.  Increase physical activity: AHA recommends 150 minutes of physical activity a week.  Medications reviewed Diabetes is at goal, ACE/ARB therapy: he is preDM Urinary Incontinence is not an issue: discussed non pharmacology and pharmacology options.  Fall risk: low- discussed PT, home fall assessment, medications.    Subjective:  [name] is a 80 y.o. male who presents for Medicare Annual Wellness Visit and 3 month follow up for HTN, hyperlipidemia, prediabetes, and vitamin D Def.  Date of last medicare wellness visit was is unknown.  His blood pressure has been controlled at home, today their BP is BP: 122/78 mmHg He does workout. He denies chest pain, shortness of breath, dizziness.  He is not on cholesterol medication and denies myalgias. His cholesterol is at goal. The cholesterol last visit was:   Lab Results  Component Value Date   CHOL 175 10/07/2013   HDL 33* 10/07/2013   LDLCALC 110* 10/07/2013   TRIG 162* 10/07/2013   CHOLHDL 5.3 10/07/2013   He has been working on diet and exercise for prediabetes, and denies polydipsia and polyuria. Last A1C in the office was:  Lab Results  Component Value Date   HGBA1C 6.1* 10/07/2013   Patient is on Vitamin D supplement.   Lab Results  Component Value Date   VD25OH 79 10/07/2013     He has ASHD with a PTCA in 2005 and another  in 2011, he follows with [name]. Dennis Gess.  Having  lower back pain and left rib pain.    Names of Other Physician/Practitioners you currently use: 1. [name] here for primary care 2. [name], eye doctor, last visit 02/12/2014 3. NONE, dentist 4. [name]- bilateral cataract removal Patient Care Team: Dennis Pinto, MD as PCP - [place], [name] as Physician Assistant (Cardiology) Dennis Perla, MD as Consulting Physician (Cardiology) Dennis Salt, MD as Consulting Physician ([facility]) Dennis Shipper, MD as Consulting Physician (Gastroenterology)  Medication Review: Current Outpatient Prescriptions on File Prior to Visit  Medication Sig Dispense Refill  . aspirin 325 MG tablet Take 325 mg by mouth daily.        Marland Kitchen atenolol (TENORMIN) 50 MG tablet TAKE 1 TABLET BY MOUTH EVERY DAY  90 tablet  4  . Cyanocobalamin (VITAMIN B-12 IJ) Inject 1 mL as directed every 30 (thirty) days.        . Flaxseed, Linseed, 1000 MG CAPS Take 1 capsule by mouth daily.        . fluorouracil (EFUDEX) 5 % cream       . gabapentin (NEURONTIN) 300 MG capsule Take 1 capsule (300 mg total) by mouth 3 (three) times daily. For neuritis pain  90 capsule  99  . meloxicam (MOBIC) 15 MG tablet Take 1 tablet (15 mg total) by mouth daily.  30 tablet  1  . multivitamin (THERAGRAN) per tablet Take 1 tablet by mouth daily.        Marland Kitchen NITROSTAT 0.4 MG SL tablet DISSOLVE 1 TABLET UNDER TONGUE EVERY 5 MINUTES AS NEEDED UP TO 3 DOSES  25 tablet  1  . omeprazole (PRILOSEC) 20 MG capsule TAKE 2 CAPSULES BY MOUTH EVERY DAY FOR INDIGESTION  60 capsule  0  . ONE TOUCH ULTRA TEST test strip        No current facility-administered medications on file prior to visit.    Current Problems (verified) Patient Active Problem List   Diagnosis Date Noted  . PVD (peripheral vascular disease) 01/01/2014  . Pre-operative cardiovascular examination 01/01/2014  . Encounter for long-term (current) use of other  medications 10/05/2013  . Hyperlipidemia 04/11/2013  . PreDiabetes 04/11/2013  . Hypertension   . Vitamin D deficiency   . Vitamin B 12 deficiency   . CAD 02/27/2009  . AAA 02/27/2009  . GERD 02/27/2009  . IRRITABLE BOWEL SYNDROME, HX OF 02/27/2009    Screening Tests Health Maintenance  Topic Date Due  . Foot Exam  05/30/1943  . Ophthalmology Exam  05/30/1943  . Colonoscopy  03/17/2013  . Hemoglobin A1c  04/08/2014  . Urine Microalbumin  10/08/2014  . Influenza Vaccine  12/01/2014  . Tetanus/tdap  10/02/2022  . Pneumococcal Polysaccharide Vaccine Age 93 And Over  Completed  . Zostavax  Completed    Immunization History  Administered Date(s) Administered  . Influenza, High Dose Seasonal PF 12/30/2013  . Influenza-Unspecified 01/13/2013  . Pneumococcal-Unspecified 06/15/2004  . Tetanus 10/01/2012  . Zoster 10/01/2012    Preventative care: Last colonoscopy: 2012 [place] 2013 AAA iliac, monitored  DEXA osteopenia 01/2013  Prior vaccinations: TD or Tdap: 2014  Influenza: 2015  Pneumococcal: 2006 Prevnar Shingles/Zostavax: 2014  History reviewed: allergies, current medications, past family history, past medical history, past social history, past surgical history and problem list   Risk Factors: Tobacco History  Substance Use Topics  . Smoking status: Former Smoker  Quit date: 07/16/1963  . Smokeless tobacco: Never Used  . Alcohol Use: No   He does not smoke.  Patient is a former smoker. Are there smokers in your home (other than you)?  No  Alcohol Current alcohol use: none  Caffeine Current caffeine use: coffee 2 /day  Exercise Current exercise: walking  Nutrition/Diet Current diet: in general, a "healthy" diet    Cardiac risk factors: advanced age (older than 72 for men, 6 for women), dyslipidemia, hypertension and male gender.  Depression Screen (Note: if answer to either of the following is "Yes", a more complete depression screening is  indicated)   Q1: Over the past two weeks, have you felt down, depressed or hopeless? No  Q2: Over the past two weeks, have you felt little interest or pleasure in doing things? No  Have you lost interest or pleasure in daily life? No  Do you often feel hopeless? No  Do you cry easily over simple problems? No  Activities of Daily Living In your present state of health, do you have any difficulty performing the following activities?:  Driving? No Managing money?  No Feeding yourself? No Getting from bed to chair? No Climbing a flight of stairs? No Preparing food and eating?: No Bathing or showering? No Getting dressed: No Getting to the toilet? No Using the toilet:No Moving around from place to place: No In the past year have you fallen or had a near fall?:No   Are you sexually active?  No  Do you have more than one partner?  No  Vision Difficulties: No  Hearing Difficulties: No Do you often ask people to speak up or repeat themselves? No Do you experience ringing or noises in your ears? No Do you have difficulty understanding soft or whispered voices? Yes  Cognition  Do you feel that you have a problem with memory?Yes  Do you often misplace items? No  Do you feel safe at home?  Yes  Advanced directives Does patient have a [place]? Yes Does patient have a Living Will? Yes   Objective:   Blood pressure 122/78, pulse 56, temperature 97.7 F (36.5 C), resp. rate 16, height 6\' 1"  (1.854 m), weight 196 lb (88.905 kg). Body mass index is 25.86 kg/(m^2).  General appearance: alert, no distress, WD/WN, male Cognitive Testing  Alert? Yes  Normal Appearance?Yes  Oriented to person? Yes  Place? Yes   Time? Yes  Recall of three objects?  Yes  Can perform simple calculations? Yes  Displays appropriate judgment?Yes  Can read the correct time from a watch face?Yes  HEENT: normocephalic, sclerae anicteric, TMs pearly, nares patent, no discharge or  erythema, pharynx normal Oral cavity: MMM, no lesions Neck: supple, no lymphadenopathy, no thyromegaly, no masses Heart: RRR, normal S1, S2, no murmurs Lungs: CTA bilaterally, no wheezes, rhonchi, or rales Abdomen: +bs, soft, non tender, non distended, no masses, no hepatomegaly, no splenomegaly Musculoskeletal: nontender, no swelling, no obvious deformity Extremities: no edema, no cyanosis, no clubbing Pulses: 2+ symmetric, upper and lower extremities, normal cap refill Neurological: alert, oriented x 3, CN2-12 intact, strength normal upper extremities and lower extremities, sensation normal throughout, DTRs 2+ throughout, no cerebellar signs, gait normal Psychiatric: normal affect, behavior normal, pleasant   Medicare Attestation I have personally reviewed: The patient's medical and social history Their use of alcohol, tobacco or illicit drugs Their current medications and supplements The patient's functional ability including ADLs,fall risks, home safety risks, cognitive, and hearing and visual impairment  Diet and physical activities Evidence for depression or mood disorders  The patient's weight, height, BMI, and visual acuity have been recorded in the chart.  I have made referrals, counseling, and provided education to the patient based on review of the above and I have provided the patient with a written personalized care plan for preventive services.     Vicie Mutters, [name]   02/14/2014

## 2014-02-15 LAB — HEMOGLOBIN A1C
Hgb A1c MFr Bld: 6.1 % — ABNORMAL HIGH (ref <5.7)
Mean Plasma Glucose: 128 mg/dL — ABNORMAL HIGH (ref <117)

## 2014-02-15 LAB — VITAMIN D 25 HYDROXY (VIT D DEFICIENCY, FRACTURES): Vit D, 25-Hydroxy: 83 ng/mL (ref 30–89)

## 2014-02-15 LAB — INSULIN, FASTING: Insulin fasting, serum: 6.2 u[IU]/mL (ref 2.0–19.6)

## 2014-03-24 ENCOUNTER — Other Ambulatory Visit: Payer: Self-pay | Admitting: *Deleted

## 2014-03-24 MED ORDER — GLUCOSE BLOOD VI STRP: ORAL_STRIP | Status: DC

## 2014-03-24 MED ORDER — "TUBERCULIN-ALLERGY SYRINGES 28G X 1/2"" 1 ML MISC": Status: DC

## 2014-04-07 ENCOUNTER — Other Ambulatory Visit: Payer: Self-pay | Admitting: *Deleted

## 2014-04-07 MED ORDER — MELOXICAM 15 MG PO TABS: 15.0000 mg | ORAL_TABLET | Freq: Every day | ORAL | Status: DC

## 2014-04-14 ENCOUNTER — Other Ambulatory Visit: Payer: Self-pay | Admitting: *Deleted

## 2014-04-14 MED ORDER — OMEPRAZOLE 20 MG PO CPDR: DELAYED_RELEASE_CAPSULE | ORAL | Status: DC

## 2014-04-16 ENCOUNTER — Other Ambulatory Visit (HOSPITAL_COMMUNITY): Payer: Self-pay | Admitting: *Deleted

## 2014-04-16 DIAGNOSIS — I714 Abdominal aortic aneurysm, without rupture, unspecified: Secondary | ICD-10-CM

## 2014-04-23 ENCOUNTER — Ambulatory Visit: Payer: Self-pay | Admitting: Internal Medicine

## 2014-05-06 ENCOUNTER — Encounter (HOSPITAL_COMMUNITY): Payer: Medicare Other

## 2014-05-06 ENCOUNTER — Ambulatory Visit (HOSPITAL_COMMUNITY): Payer: Medicare Other | Attending: Cardiovascular Disease | Admitting: Cardiology

## 2014-05-06 DIAGNOSIS — Z87891 Personal history of nicotine dependence: Secondary | ICD-10-CM | POA: Insufficient documentation

## 2014-05-06 DIAGNOSIS — I714 Abdominal aortic aneurysm, without rupture, unspecified: Secondary | ICD-10-CM

## 2014-05-06 DIAGNOSIS — I739 Peripheral vascular disease, unspecified: Secondary | ICD-10-CM | POA: Diagnosis not present

## 2014-05-06 DIAGNOSIS — J449 Chronic obstructive pulmonary disease, unspecified: Secondary | ICD-10-CM | POA: Diagnosis not present

## 2014-05-06 DIAGNOSIS — E119 Type 2 diabetes mellitus without complications: Secondary | ICD-10-CM | POA: Diagnosis not present

## 2014-05-06 DIAGNOSIS — E785 Hyperlipidemia, unspecified: Secondary | ICD-10-CM | POA: Diagnosis not present

## 2014-05-06 DIAGNOSIS — I2510 Atherosclerotic heart disease of native coronary artery without angina pectoris: Secondary | ICD-10-CM | POA: Diagnosis not present

## 2014-05-06 DIAGNOSIS — I10 Essential (primary) hypertension: Secondary | ICD-10-CM | POA: Diagnosis not present

## 2014-05-06 NOTE — Progress Notes (Signed)
Abdominal Aorta Duplex performed  

## 2014-05-17 DIAGNOSIS — M9901 Segmental and somatic dysfunction of cervical region: Secondary | ICD-10-CM | POA: Diagnosis not present

## 2014-05-17 DIAGNOSIS — M5134 Other intervertebral disc degeneration, thoracic region: Secondary | ICD-10-CM | POA: Diagnosis not present

## 2014-05-19 DIAGNOSIS — M9902 Segmental and somatic dysfunction of thoracic region: Secondary | ICD-10-CM | POA: Diagnosis not present

## 2014-05-19 DIAGNOSIS — M5134 Other intervertebral disc degeneration, thoracic region: Secondary | ICD-10-CM | POA: Diagnosis not present

## 2014-05-20 ENCOUNTER — Encounter: Payer: Self-pay | Admitting: Internal Medicine

## 2014-05-20 ENCOUNTER — Ambulatory Visit (INDEPENDENT_AMBULATORY_CARE_PROVIDER_SITE_OTHER): Payer: Medicare Other | Admitting: Internal Medicine

## 2014-05-20 ENCOUNTER — Other Ambulatory Visit: Payer: Self-pay | Admitting: Internal Medicine

## 2014-05-20 VITALS — BP 132/76 | HR 56 | Temp 97.7°F | Resp 16 | Ht 72.5 in | Wt 195.4 lb

## 2014-05-20 DIAGNOSIS — E782 Mixed hyperlipidemia: Secondary | ICD-10-CM | POA: Diagnosis not present

## 2014-05-20 DIAGNOSIS — Z79899 Other long term (current) drug therapy: Secondary | ICD-10-CM | POA: Diagnosis not present

## 2014-05-20 DIAGNOSIS — R7303 Prediabetes: Secondary | ICD-10-CM

## 2014-05-20 DIAGNOSIS — R7309 Other abnormal glucose: Secondary | ICD-10-CM | POA: Diagnosis not present

## 2014-05-20 DIAGNOSIS — M5440 Lumbago with sciatica, unspecified side: Secondary | ICD-10-CM

## 2014-05-20 DIAGNOSIS — I10 Essential (primary) hypertension: Secondary | ICD-10-CM

## 2014-05-20 DIAGNOSIS — E559 Vitamin D deficiency, unspecified: Secondary | ICD-10-CM

## 2014-05-20 LAB — CBC WITH DIFFERENTIAL/PLATELET
Basophils Absolute: 0 10*3/uL (ref 0.0–0.1)
Basophils Relative: 0 % (ref 0–1)
Eosinophils Absolute: 0.2 10*3/uL (ref 0.0–0.7)
Eosinophils Relative: 2 % (ref 0–5)
HCT: 42.6 % (ref 39.0–52.0)
Hemoglobin: 14.4 g/dL (ref 13.0–17.0)
Lymphocytes Relative: 34 % (ref 12–46)
Lymphs Abs: 2.9 10*3/uL (ref 0.7–4.0)
MCH: 31.9 pg (ref 26.0–34.0)
MCHC: 33.8 g/dL (ref 30.0–36.0)
MCV: 94.2 fL (ref 78.0–100.0)
MPV: 9.1 fL (ref 8.6–12.4)
Monocytes Absolute: 0.5 10*3/uL (ref 0.1–1.0)
Monocytes Relative: 6 % (ref 3–12)
Neutro Abs: 4.9 10*3/uL (ref 1.7–7.7)
Neutrophils Relative %: 58 % (ref 43–77)
Platelets: 258 10*3/uL (ref 150–400)
RBC: 4.52 MIL/uL (ref 4.22–5.81)
RDW: 13.3 % (ref 11.5–15.5)
WBC: 8.5 10*3/uL (ref 4.0–10.5)

## 2014-05-20 LAB — HEMOGLOBIN A1C
Hgb A1c MFr Bld: 6.1 % — ABNORMAL HIGH (ref <5.7)
Mean Plasma Glucose: 128 mg/dL — ABNORMAL HIGH (ref <117)

## 2014-05-20 MED ORDER — DEXAMETHASONE 1 MG PO TABS: ORAL_TABLET | ORAL | Status: DC

## 2014-05-20 NOTE — Patient Instructions (Signed)
   Recommend the book "The END of DIETING" by Dr Joel Fuhrman   & the book "The END of DIABETES " by Dr Joel Fuhrman  At [employer] - get book & Audio CD's      Being diabetic has a  300% increased risk for heart attack, stroke, cancer, and alzheimer- type vascular dementia. It is very important that you work harder with diet by avoiding all foods that are white except chicken & fish. Avoid white rice (brown & wild rice is OK), white potatoes (sweetpotatoes in moderation is OK), White bread or wheat bread or anything made out of white flour like bagels, donuts, rolls, buns, biscuits, cakes, pastries, cookies, pizza crust, and pasta (made from white flour & egg whites) - vegetarian pasta or spinach or wheat pasta is OK. Multigrain breads like Arnold's or [employer], or multigrain sandwich thins or flatbreads.  Diet, exercise and weight loss can reverse and cure diabetes in the early stages.  Diet, exercise and weight loss is very important in the control and prevention of complications of diabetes which affects every system in your body, ie. Brain - dementia/stroke, eyes - glaucoma/blindness, heart - heart attack/heart failure, kidneys - dialysis, stomach - gastric paralysis, intestines - malabsorption, nerves - severe painful neuritis, circulation - gangrene & loss of a leg(s), and finally cancer and Alzheimers.    I recommend avoid fried & greasy foods,  sweets/candy, white rice (brown or wild rice or Quinoa is OK), white potatoes (sweet potatoes are OK) - anything made from white flour - bagels, doughnuts, rolls, buns, biscuits,white and wheat breads, pizza crust and traditional pasta made of white flour & egg white(vegetarian pasta or spinach or wheat pasta is OK).  Multi-grain bread is OK - like multi-grain flat bread or sandwich thins. Avoid alcohol in excess. Exercise is also important.    Eat all the vegetables you want - avoid meat, especially red meat and dairy - especially cheese.  Cheese  is the most concentrated form of trans-fats which is the worst thing to clog up our arteries. Veggie cheese is OK which can be found in the fresh produce section at [employer] or [employer] or [employer]   

## 2014-05-20 NOTE — Progress Notes (Signed)
Patient ID: [name], male   DOB: [birth year], 81 y.o.   MRN: [id number]   This very nice 81 y.o. MWM presents for 3 month follow up with Hypertension, Hyperlipidemia, Pre-Diabetes, Vitamin B12 and Vitamin D Deficiencies.    Patient is treated for HTN & BP has been controlled at home. Patient has ASCAD with a PTCA in 2005 and again in 2011 a 2sd PTCA w/Stent Today's BP: 132/76 mmHg. Patient has had no complaints of any cardiac type chest pain, palpitations, dyspnea/orthopnea/PND, dizziness, claudication, or dependent edema.   Hyperlipidemia is not controlled with diet & supplements as patient is statin intolerant. Patient denies myalgias or other med SE's. Last Lipids were not at goal - Total Chol 173; HDL 36; LDL 113; Trig 122 on 02/14/2014.   Also, the patient has history of  PreDiabetes since 2008 with A1c 6.1% and has had no symptoms of reactive hypoglycemia, diabetic polys, paresthesias or visual blurring.  Last A1c was  6.1% on  02/14/2014   Further, the patient also has history of Vitamin D Deficiency of 48 in 2008 and supplements vitamin D without any suspected side-effects. Last vitamin D was  83 on  02/14/2014   Medication List   atenolol 50 MG tablet  TAKE 1 TABLET BY MOUTH EVERY DAY     Flaxseed (Linseed) 1000 MG Caps  Take 1 capsule by mouth daily.     gabapentin 300 MG capsule  Take 1 capsule (300 mg total) by mouth 3 (three) times daily. For neuritis pain     meloxicam 15 MG tablet  Take 1 tablet (15 mg total) by mouth daily.     multivitamin per tablet  Take 1 tablet by mouth daily.     NITROSTAT 0.4 MG SL tablet  DISSOLVE 1 TABLET UNDER TONGUE EVERY 5 MINUTES AS NEEDED      omeprazole 20 MG capsule  TAKE 2 CAPSULES BY MOUTH EVERY DAY FOR INDIGESTION     VITAMIN B-12 IJ  Inject 1 mL as directed every 30 (thirty) days.     Allergies  Allergen Reactions  . Cymbalta [Duloxetine Hcl]     Dizziness  . Keflex [Cephalexin]   . Simvastatin   . Sudafed  [Pseudoephedrine]     Dizziness  . Prednisone Rash   PMHx:   Past Medical History  Diagnosis Date  . Coronary atherosclerosis of unspecified type of vessel, native or graft   . Aneurysm of iliac artery   . Personal history of other diseases of digestive system   . Other and unspecified hyperlipidemia   . Colon polyps   . Gastritis, chronic   . IBS (irritable bowel syndrome)   . Hypertension   . Diabetes mellitus     Diet control   . Vitamin D deficiency   . Vitamin B 12 deficiency   . Esophageal reflux    Immunization History  Administered Date(s) Administered  . Influenza, High Dose Seasonal PF 12/30/2013  . Influenza-Unspecified 01/13/2013  . Pneumococcal Conjugate-13 02/14/2014  . Pneumococcal-Unspecified 06/15/2004  . Tetanus 10/01/2012  . Zoster 10/01/2012   Past Surgical History  Procedure Laterality Date  . Knee surgery    . Bladder surgery    . Coronary angioplasty with stent placement  2005 & 2011   FHx:    Reviewed / unchanged  SHx:    Reviewed / unchanged  Systems Review:  Constitutional: Denies fever, chills, wt changes, headaches, insomnia, fatigue, night sweats, change in appetite. Eyes: Denies redness, blurred vision, diplopia,  discharge, itchy, watery eyes.  ENT: Denies discharge, congestion, post nasal drip, epistaxis, sore throat, earache, hearing loss, dental pain, tinnitus, vertigo, sinus pain, snoring.  CV: Denies chest pain, palpitations, irregular heartbeat, syncope, dyspnea, diaphoresis, orthopnea, PND, claudication or edema. Respiratory: denies cough, dyspnea, DOE, pleurisy, hoarseness, laryngitis, wheezing.  Gastrointestinal: Denies dysphagia, odynophagia, heartburn, reflux, water brash, abdominal pain or cramps, nausea, vomiting, bloating, diarrhea, constipation, hematemesis, melena, hematochezia  or hemorrhoids. Genitourinary: Denies dysuria, frequency, urgency, nocturia, hesitancy, discharge, hematuria or flank pain. Musculoskeletal: Denies  arthralgias, myalgias, stiffness, jt. swelling, pain, limping or strain/sprain.  Skin: Denies pruritus, rash, hives, warts, acne, eczema or change in skin lesion(s). Neuro: No weakness, tremor, incoordination, spasms, paresthesia or pain. Psychiatric: Denies confusion, memory loss or sensory loss. Endo: Denies change in weight, skin or hair change.  Heme/Lymph: No excessive bleeding, bruising or enlarged lymph nodes.  Physical Exam  BP 132/76   Pulse 56  Temp 97.7 F   Resp 16  Ht 6' 0.5"   Wt 195 lb 6.4 oz    BMI 26.12   Appears well nourished and in no distress. Eyes: PERRLA, EOMs, conjunctiva no swelling or erythema. Sinuses: No frontal/maxillary tenderness ENT/Mouth: EAC's clear, TM's nl w/o erythema, bulging. Nares clear w/o erythema, swelling, exudates. Oropharynx clear without erythema or exudates. Oral hygiene is good. Tongue normal, non obstructing. Hearing intact.  Neck: Supple. Thyroid nl. Car 2+/2+ without bruits, nodes or JVD. Chest: Respirations nl with BS clear & equal w/o rales, rhonchi, wheezing or stridor.  Cor: Heart sounds normal w/ regular rate and rhythm without sig. murmurs, gallops, clicks, or rubs. Peripheral pulses normal and equal  without edema.  Abdomen: Soft & bowel sounds normal. Non-tender w/o guarding, rebound, hernias, masses, or organomegaly.  Lymphatics: Unremarkable.  Musculoskeletal: Full ROM all peripheral extremities, joint stability, 5/5 strength, and normal gait.  Skin: Warm, dry without exposed rashes, lesions or ecchymosis apparent.  Neuro: Cranial nerves intact, reflexes equal bilaterally. Sensory-motor testing grossly intact. Tendon reflexes grossly intact.  Pysch: Alert & oriented x 3.  Insight and judgement nl & appropriate. No ideations.  Assessment and Plan:  1. Essential hypertension  - TSH  2. Hyperlipidemia  - Lipid panel  3. Prediabetes  - Hemoglobin A1c - Insulin, fasting  4. Vitamin D deficiency  - Vit D  25  hydroxy (rtn osteoporosis monitoring)  5. Encounter for long-term (current) use of medications  - BASIC METABOLIC PANEL WITH GFR - Hepatic function panel - Magnesium - CBC with Differential   Recommended regular exercise, BP monitoring, weight control, and discussed med and SE's. Recommended labs to assess and monitor clinical status. Further disposition pending results of labs. ROV - 3 months.

## 2014-05-21 ENCOUNTER — Other Ambulatory Visit: Payer: Self-pay | Admitting: *Deleted

## 2014-05-21 DIAGNOSIS — M5134 Other intervertebral disc degeneration, thoracic region: Secondary | ICD-10-CM | POA: Diagnosis not present

## 2014-05-21 DIAGNOSIS — M9902 Segmental and somatic dysfunction of thoracic region: Secondary | ICD-10-CM | POA: Diagnosis not present

## 2014-05-21 DIAGNOSIS — M5440 Lumbago with sciatica, unspecified side: Secondary | ICD-10-CM

## 2014-05-21 LAB — HEPATIC FUNCTION PANEL
ALT: 13 U/L (ref 0–53)
AST: 22 U/L (ref 0–37)
Albumin: 3.8 g/dL (ref 3.5–5.2)
Alkaline Phosphatase: 73 U/L (ref 39–117)
Bilirubin, Direct: 0.1 mg/dL (ref 0.0–0.3)
Indirect Bilirubin: 0.5 mg/dL (ref 0.2–1.2)
Total Bilirubin: 0.6 mg/dL (ref 0.2–1.2)
Total Protein: 6.8 g/dL (ref 6.0–8.3)

## 2014-05-21 LAB — BASIC METABOLIC PANEL WITH GFR
BUN: 23 mg/dL (ref 6–23)
CO2: 31 mEq/L (ref 19–32)
Calcium: 9.7 mg/dL (ref 8.4–10.5)
Chloride: 103 mEq/L (ref 96–112)
Creat: 1.18 mg/dL (ref 0.50–1.35)
GFR, Est African American: 68 mL/min
GFR, Est Non African American: 59 mL/min — ABNORMAL LOW
Glucose, Bld: 91 mg/dL (ref 70–99)
Potassium: 5.3 mEq/L (ref 3.5–5.3)
Sodium: 144 mEq/L (ref 135–145)

## 2014-05-21 LAB — MAGNESIUM: Magnesium: 2.1 mg/dL (ref 1.5–2.5)

## 2014-05-21 LAB — LIPID PANEL
Cholesterol: 162 mg/dL (ref 0–200)
HDL: 32 mg/dL — ABNORMAL LOW (ref >39)
LDL Cholesterol: 103 mg/dL — ABNORMAL HIGH (ref 0–99)
Total CHOL/HDL Ratio: 5.1 Ratio
Triglycerides: 133 mg/dL (ref <150)
VLDL: 27 mg/dL (ref 0–40)

## 2014-05-21 LAB — VITAMIN D 25 HYDROXY (VIT D DEFICIENCY, FRACTURES): Vit D, 25-Hydroxy: 67 ng/mL (ref 30–100)

## 2014-05-21 LAB — INSULIN, FASTING: Insulin fasting, serum: 5.5 u[IU]/mL (ref 2.0–19.6)

## 2014-05-21 LAB — TSH: TSH: 2.788 u[IU]/mL (ref 0.350–4.500)

## 2014-05-21 MED ORDER — DEXAMETHASONE 1 MG PO TABS: ORAL_TABLET | ORAL | Status: DC

## 2014-05-28 DIAGNOSIS — M5134 Other intervertebral disc degeneration, thoracic region: Secondary | ICD-10-CM | POA: Diagnosis not present

## 2014-05-28 DIAGNOSIS — M9902 Segmental and somatic dysfunction of thoracic region: Secondary | ICD-10-CM | POA: Diagnosis not present

## 2014-05-29 ENCOUNTER — Encounter: Payer: Self-pay | Admitting: Cardiology

## 2014-06-18 DIAGNOSIS — M9902 Segmental and somatic dysfunction of thoracic region: Secondary | ICD-10-CM | POA: Diagnosis not present

## 2014-06-18 DIAGNOSIS — M5134 Other intervertebral disc degeneration, thoracic region: Secondary | ICD-10-CM | POA: Diagnosis not present

## 2014-06-20 ENCOUNTER — Ambulatory Visit (INDEPENDENT_AMBULATORY_CARE_PROVIDER_SITE_OTHER): Payer: Medicare Other | Admitting: Internal Medicine

## 2014-06-20 ENCOUNTER — Other Ambulatory Visit: Payer: Self-pay | Admitting: Internal Medicine

## 2014-06-20 ENCOUNTER — Encounter: Payer: Self-pay | Admitting: Internal Medicine

## 2014-06-20 VITALS — BP 138/66 | HR 60 | Temp 97.9°F | Resp 16 | Ht 72.5 in | Wt 199.8 lb

## 2014-06-20 DIAGNOSIS — D485 Neoplasm of uncertain behavior of skin: Secondary | ICD-10-CM | POA: Diagnosis not present

## 2014-06-20 DIAGNOSIS — I10 Essential (primary) hypertension: Secondary | ICD-10-CM

## 2014-06-20 DIAGNOSIS — L821 Other seborrheic keratosis: Secondary | ICD-10-CM | POA: Diagnosis not present

## 2014-06-20 DIAGNOSIS — H02826 Cysts of left eye, unspecified eyelid: Secondary | ICD-10-CM

## 2014-06-20 NOTE — Progress Notes (Signed)
   Subjective:    Patient ID: HEATON SARIN, male    DOB: [birth year], 81 y.o.   MRN: [id number]  HPI    Very nice 81 yo MWM with HTN presenting for BP recheck and c/o pruritic raised skin lesion of the left lateral neck. Also has a smaller  flatter similar lesion of the left temple. Lastly he has a pearly white 3-4 mm BB sized lesion of the lateral tarsal margin of the left eye which appeared to be an inclusion cyst in line with the [place] or tarsal glands. HT systems review is negative and random BP's have been Nl.  Meds, All, PMHx & PSHx - all reviewed & unchanged.  Review of Systems    Objective:   Physical Exam  1)       3 mm pearly white cyst of the lateral 1/3 margin of the superior tarsal margin of the left upper eye lid. After informed consent and local anesthesia with 0.2 ml of Marcaine 0.5% w/epi, the lesion was incised with a #11 scalpel and no fluid or clear mucoid material could be expressed, but rather there was a fibrous/"crunchy" material which could not be excavated & removed. Thereafter, # 10 scalpel was utilized to sharply excise by shave technique while traction was applied and pressure was applied x 10 minutes to obtain hemostasis. (CPT: 11310 - excision inclusion cyst)  2)      A 3 x 5 mm pinkish brown excoriated and scaly lesion of the left lateral nec was anesthetized w/Marcaine 0.5% w/ epi and then excised by shave excisional technique with a #10 scalpel and sent for path analysis and then was hyfrecated for hemostasis and electrodesiccation of any remnant lesion fragments, (CPT: 11311 - excision lesion left temple)  3)    A similar 3 x 4 mm similar and flatter lesion was anesthetized & hyfrecated in a similar fashion. (CPT: excision lesion left neck)  Sterile dressings were applied to all and wound care was advised.     Assessment & Plan:   1. Essential hypertension   2. Neoplasm of uncertain behavior of skin  - Dermatology pathology  3. Epidermal  inclusion Cyst of Left upper eye lid   - wound care advised and patient to return as needed.

## 2014-07-09 DIAGNOSIS — M5134 Other intervertebral disc degeneration, thoracic region: Secondary | ICD-10-CM | POA: Diagnosis not present

## 2014-07-09 DIAGNOSIS — M9902 Segmental and somatic dysfunction of thoracic region: Secondary | ICD-10-CM | POA: Diagnosis not present

## 2014-07-11 ENCOUNTER — Other Ambulatory Visit: Payer: Self-pay | Admitting: Internal Medicine

## 2014-07-24 ENCOUNTER — Other Ambulatory Visit: Payer: Self-pay | Admitting: *Deleted

## 2014-07-24 MED ORDER — NITROGLYCERIN 0.4 MG SL SUBL
SUBLINGUAL_TABLET | SUBLINGUAL | Status: DC
Start: 2014-07-24 — End: 2015-07-11

## 2014-08-06 DIAGNOSIS — M5134 Other intervertebral disc degeneration, thoracic region: Secondary | ICD-10-CM | POA: Diagnosis not present

## 2014-08-06 DIAGNOSIS — M9902 Segmental and somatic dysfunction of thoracic region: Secondary | ICD-10-CM | POA: Diagnosis not present

## 2014-09-03 DIAGNOSIS — M9902 Segmental and somatic dysfunction of thoracic region: Secondary | ICD-10-CM | POA: Diagnosis not present

## 2014-09-03 DIAGNOSIS — M5134 Other intervertebral disc degeneration, thoracic region: Secondary | ICD-10-CM | POA: Diagnosis not present

## 2014-09-24 ENCOUNTER — Other Ambulatory Visit: Payer: Self-pay | Admitting: Internal Medicine

## 2014-10-01 DIAGNOSIS — M5134 Other intervertebral disc degeneration, thoracic region: Secondary | ICD-10-CM | POA: Diagnosis not present

## 2014-10-01 DIAGNOSIS — M9902 Segmental and somatic dysfunction of thoracic region: Secondary | ICD-10-CM | POA: Diagnosis not present

## 2014-10-08 ENCOUNTER — Encounter: Payer: Self-pay | Admitting: Gastroenterology

## 2014-10-11 ENCOUNTER — Other Ambulatory Visit: Payer: Self-pay | Admitting: Internal Medicine

## 2014-10-13 ENCOUNTER — Ambulatory Visit (INDEPENDENT_AMBULATORY_CARE_PROVIDER_SITE_OTHER): Payer: Medicare Other | Admitting: Internal Medicine

## 2014-10-13 ENCOUNTER — Encounter: Payer: Self-pay | Admitting: Internal Medicine

## 2014-10-13 VITALS — BP 124/70 | HR 56 | Temp 97.3°F | Resp 16 | Ht 72.5 in | Wt 198.0 lb

## 2014-10-13 DIAGNOSIS — R7303 Prediabetes: Secondary | ICD-10-CM

## 2014-10-13 DIAGNOSIS — E782 Mixed hyperlipidemia: Secondary | ICD-10-CM | POA: Diagnosis not present

## 2014-10-13 DIAGNOSIS — D649 Anemia, unspecified: Secondary | ICD-10-CM | POA: Diagnosis not present

## 2014-10-13 DIAGNOSIS — E538 Deficiency of other specified B group vitamins: Secondary | ICD-10-CM

## 2014-10-13 DIAGNOSIS — R5383 Other fatigue: Secondary | ICD-10-CM

## 2014-10-13 DIAGNOSIS — I714 Abdominal aortic aneurysm, without rupture, unspecified: Secondary | ICD-10-CM

## 2014-10-13 DIAGNOSIS — I739 Peripheral vascular disease, unspecified: Secondary | ICD-10-CM

## 2014-10-13 DIAGNOSIS — E559 Vitamin D deficiency, unspecified: Secondary | ICD-10-CM

## 2014-10-13 DIAGNOSIS — R6889 Other general symptoms and signs: Secondary | ICD-10-CM | POA: Diagnosis not present

## 2014-10-13 DIAGNOSIS — R7309 Other abnormal glucose: Secondary | ICD-10-CM

## 2014-10-13 DIAGNOSIS — Z79899 Other long term (current) drug therapy: Secondary | ICD-10-CM | POA: Diagnosis not present

## 2014-10-13 DIAGNOSIS — Z8719 Personal history of other diseases of the digestive system: Secondary | ICD-10-CM

## 2014-10-13 DIAGNOSIS — I10 Essential (primary) hypertension: Secondary | ICD-10-CM

## 2014-10-13 DIAGNOSIS — Z0000 Encounter for general adult medical examination without abnormal findings: Secondary | ICD-10-CM

## 2014-10-13 DIAGNOSIS — Z1331 Encounter for screening for depression: Secondary | ICD-10-CM

## 2014-10-13 DIAGNOSIS — Z1212 Encounter for screening for malignant neoplasm of rectum: Secondary | ICD-10-CM

## 2014-10-13 DIAGNOSIS — I2510 Atherosclerotic heart disease of native coronary artery without angina pectoris: Secondary | ICD-10-CM

## 2014-10-13 DIAGNOSIS — Z0001 Encounter for general adult medical examination with abnormal findings: Secondary | ICD-10-CM | POA: Diagnosis not present

## 2014-10-13 DIAGNOSIS — K219 Gastro-esophageal reflux disease without esophagitis: Secondary | ICD-10-CM

## 2014-10-13 DIAGNOSIS — Z9181 History of falling: Secondary | ICD-10-CM

## 2014-10-13 LAB — CBC WITH DIFFERENTIAL/PLATELET
Basophils Absolute: 0 10*3/uL (ref 0.0–0.1)
Basophils Relative: 0 % (ref 0–1)
Eosinophils Absolute: 0.3 10*3/uL (ref 0.0–0.7)
Eosinophils Relative: 4 % (ref 0–5)
HCT: 38.4 % — ABNORMAL LOW (ref 39.0–52.0)
Hemoglobin: 12.9 g/dL — ABNORMAL LOW (ref 13.0–17.0)
Lymphocytes Relative: 34 % (ref 12–46)
Lymphs Abs: 2.3 10*3/uL (ref 0.7–4.0)
MCH: 32.3 pg (ref 26.0–34.0)
MCHC: 33.6 g/dL (ref 30.0–36.0)
MCV: 96 fL (ref 78.0–100.0)
MPV: 9 fL (ref 8.6–12.4)
Monocytes Absolute: 0.4 10*3/uL (ref 0.1–1.0)
Monocytes Relative: 6 % (ref 3–12)
Neutro Abs: 3.9 10*3/uL (ref 1.7–7.7)
Neutrophils Relative %: 56 % (ref 43–77)
Platelets: 215 10*3/uL (ref 150–400)
RBC: 4 MIL/uL — ABNORMAL LOW (ref 4.22–5.81)
RDW: 13.5 % (ref 11.5–15.5)
WBC: 6.9 10*3/uL (ref 4.0–10.5)

## 2014-10-13 LAB — HEPATIC FUNCTION PANEL
ALT: 10 U/L (ref 0–53)
AST: 20 U/L (ref 0–37)
Albumin: 4.1 g/dL (ref 3.5–5.2)
Alkaline Phosphatase: 63 U/L (ref 39–117)
Bilirubin, Direct: 0.1 mg/dL (ref 0.0–0.3)
Indirect Bilirubin: 0.4 mg/dL (ref 0.2–1.2)
Total Bilirubin: 0.5 mg/dL (ref 0.2–1.2)
Total Protein: 6.6 g/dL (ref 6.0–8.3)

## 2014-10-13 LAB — IRON AND TIBC
%SAT: 26 % (ref 20–55)
Iron: 106 ug/dL (ref 42–165)
TIBC: 401 ug/dL (ref 215–435)
UIBC: 295 ug/dL (ref 125–400)

## 2014-10-13 LAB — LIPID PANEL
Cholesterol: 167 mg/dL (ref 0–200)
HDL: 29 mg/dL — ABNORMAL LOW (ref >=40)
LDL Cholesterol: 100 mg/dL — ABNORMAL HIGH (ref 0–99)
Total CHOL/HDL Ratio: 5.8 Ratio
Triglycerides: 188 mg/dL — ABNORMAL HIGH (ref <150)
VLDL: 38 mg/dL (ref 0–40)

## 2014-10-13 LAB — BASIC METABOLIC PANEL WITH GFR
BUN: 25 mg/dL — ABNORMAL HIGH (ref 6–23)
CO2: 27 mEq/L (ref 19–32)
Calcium: 9.5 mg/dL (ref 8.4–10.5)
Chloride: 105 mEq/L (ref 96–112)
Creat: 1.32 mg/dL (ref 0.50–1.35)
GFR, Est African American: 59 mL/min — ABNORMAL LOW
GFR, Est Non African American: 51 mL/min — ABNORMAL LOW
Glucose, Bld: 107 mg/dL — ABNORMAL HIGH (ref 70–99)
Potassium: 5.5 mEq/L — ABNORMAL HIGH (ref 3.5–5.3)
Sodium: 141 mEq/L (ref 135–145)

## 2014-10-13 LAB — HEMOGLOBIN A1C
Hgb A1c MFr Bld: 5.9 % — ABNORMAL HIGH (ref <5.7)
Mean Plasma Glucose: 123 mg/dL — ABNORMAL HIGH (ref <117)

## 2014-10-13 LAB — MAGNESIUM: Magnesium: 2.2 mg/dL (ref 1.5–2.5)

## 2014-10-13 LAB — TSH: TSH: 2.252 u[IU]/mL (ref 0.350–4.500)

## 2014-10-13 LAB — VITAMIN B12: Vitamin B-12: 538 pg/mL (ref 211–911)

## 2014-10-13 NOTE — Patient Instructions (Signed)
Preventive Care for Adults A healthy lifestyle and preventive care can promote health and wellness. Preventive health guidelines for men include the following key practices:  A routine yearly physical is a good way to check with your health care provider about your health and preventative screening. It is a chance to share any concerns and updates on your health and to receive a thorough exam.  Visit your dentist for a routine exam and preventative care every 6 months. Brush your teeth twice a day and floss once a day. Good oral hygiene prevents tooth decay and gum disease.  The frequency of eye exams is based on your age, health, family medical history, use of contact lenses, and other factors. Follow your health care provider's recommendations for frequency of eye exams.  Eat a healthy diet. Foods such as vegetables, fruits, whole grains, low-fat dairy products, and lean protein foods contain the nutrients you need without too many calories. Decrease your intake of foods high in solid fats, added sugars, and salt. Eat the right amount of calories for you.Get information about a proper diet from your health care provider, if necessary.  Regular physical exercise is one of the most important things you can do for your health. Most adults should get at least 150 minutes of moderate-intensity exercise (any activity that increases your heart rate and causes you to sweat) each week. In addition, most adults need muscle-strengthening exercises on 2 or more days a week.  Maintain a healthy weight. The body mass index (BMI) is a screening tool to identify possible weight problems. It provides an estimate of body fat based on height and weight. Your health care provider can find your BMI and can help you achieve or maintain a healthy weight.For adults 20 years and older:  A BMI below 18.5 is considered underweight.  A BMI of 18.5 to 24.9 is normal.  A BMI of 25 to 29.9 is considered overweight.  A BMI  of 30 and above is considered obese.  Maintain normal blood lipids and cholesterol levels by exercising and minimizing your intake of saturated fat. Eat a balanced diet with plenty of fruit and vegetables. Blood tests for lipids and cholesterol should begin at age 20 and be repeated every 5 years. If your lipid or cholesterol levels are high, you are over 50, or you are at high risk for heart disease, you may need your cholesterol levels checked more frequently.Ongoing high lipid and cholesterol levels should be treated with medicines if diet and exercise are not working.  If you smoke, find out from your health care provider how to quit. If you do not use tobacco, do not start.  Lung cancer screening is recommended for adults aged 55-80 years who are at high risk for developing lung cancer because of a history of smoking. A yearly low-dose CT scan of the lungs is recommended for people who have at least a 30-pack-year history of smoking and are a current smoker or have quit within the past 15 years. A pack year of smoking is smoking an average of 1 pack of cigarettes a day for 1 year (for example: 1 pack a day for 30 years or 2 packs a day for 15 years). Yearly screening should continue until the smoker has stopped smoking for at least 15 years. Yearly screening should be stopped for people who develop a health problem that would prevent them from having lung cancer treatment.  If you choose to drink alcohol, do not have more than   2 drinks per day. One drink is considered to be 12 ounces (355 mL) of beer, 5 ounces (148 mL) of wine, or 1.5 ounces (44 mL) of liquor.  Avoid use of street drugs. Do not share needles with anyone. Ask for help if you need support or instructions about stopping the use of drugs.  High blood pressure causes heart disease and increases the risk of stroke. Your blood pressure should be checked at least every 1-2 years. Ongoing high blood pressure should be treated with  medicines, if weight loss and exercise are not effective.  If you are 45-79 years old, ask your health care provider if you should take aspirin to prevent heart disease.  Diabetes screening involves taking a blood sample to check your fasting blood sugar level. Testing should be considered at a younger age or be carried out more frequently if you are overweight and have at least 1 risk factor for diabetes.  Colorectal cancer can be detected and often prevented. Most routine colorectal cancer screening begins at the age of 50 and continues through age 75. However, your health care provider may recommend screening at an earlier age if you have risk factors for colon cancer. On a yearly basis, your health care provider may provide home test kits to check for hidden blood in the stool. Use of a small camera at the end of a tube to directly examine the colon (sigmoidoscopy or colonoscopy) can detect the earliest forms of colorectal cancer. Talk to your health care provider about this at age 50, when routine screening begins. Direct exam of the colon should be repeated every 5-10 years through age 75, unless early forms of precancerous polyps or small growths are found.  Hepatitis C blood testing is recommended for all people born from 1945 through 1965 and any individual with known risks for hepatitis C.  Screening for abdominal aortic aneurysm (AAA)  by ultrasound is recommended for people who have history of high blood pressure or who are current or former smokers.  Healthy men should  receive prostate-specific antigen (PSA) blood tests as part of routine cancer screening. Talk with your health care provider about prostate cancer screening.  Testicular cancer screening is  recommended for adult males. Screening includes self-exam, a health care provider exam, and other screening tests. Consult with your health care provider about any symptoms you have or any concerns you have about testicular  cancer.  Use sunscreen. Apply sunscreen liberally and repeatedly throughout the day. You should seek shade when your shadow is shorter than you. Protect yourself by wearing long sleeves, pants, a wide-brimmed hat, and sunglasses year round, whenever you are outdoors.  Once a month, do a whole-body skin exam, using a mirror to look at the skin on your back. Tell your health care provider about new moles, moles that have irregular borders, moles that are larger than a pencil eraser, or moles that have changed in shape or color.  Stay current with required vaccines (immunizations).  Influenza vaccine. All adults should be immunized every year.  Tetanus, diphtheria, and acellular pertussis (Td, Tdap) vaccine. An adult who has not previously received Tdap or who does not know his vaccine status should receive 1 dose of Tdap. This initial dose should be followed by tetanus and diphtheria toxoids (Td) booster doses every 10 years. Adults with an unknown or incomplete history of completing a 3-dose immunization series with Td-containing vaccines should begin or complete a primary immunization series including a Tdap dose. Adults should   receive a Td booster every 10 years.  Zoster vaccine. One dose is recommended for adults aged 60 years or older unless certain conditions are present.    PREVNAR - Pneumococcal 13-valent conjugate (PCV13) vaccine. When indicated, a person who is uncertain of his immunization history and has no record of immunization should receive the PCV13 vaccine. An adult aged 19 years or older who has certain medical conditions and has not been previously immunized should receive 1 dose of PCV13 vaccine. This PCV13 should be followed with a dose of pneumococcal polysaccharide (PPSV23) vaccine. The PPSV23 vaccine dose should be obtained at least 8 weeks after the dose of PCV13 vaccine. An adult aged 19 years or older who has certain medical conditions and previously received 1 or more doses  of PPSV23 vaccine should receive 1 dose of PCV13. The PCV13 vaccine dose should be obtained 1 or more years after the last PPSV23 vaccine dose.    PNEUMOVAX - Pneumococcal polysaccharide (PPSV23) vaccine. When PCV13 is also indicated, PCV13 should be obtained first. All adults aged 65 years and older should be immunized. An adult younger than age 65 years who has certain medical conditions should be immunized. Any person who resides in a nursing home or long-term care facility should be immunized. An adult smoker should be immunized. People with an immunocompromised condition and certain other conditions should receive both PCV13 and PPSV23 vaccines. People with human immunodeficiency virus (HIV) infection should be immunized as soon as possible after diagnosis. Immunization during chemotherapy or radiation therapy should be avoided. Routine use of PPSV23 vaccine is not recommended for American Indians, [place] Natives, or people younger than 65 years unless there are medical conditions that require PPSV23 vaccine. When indicated, people who have unknown immunization and have no record of immunization should receive PPSV23 vaccine. One-time revaccination 5 years after the first dose of PPSV23 is recommended for people aged 19-64 years who have chronic kidney failure, nephrotic syndrome, asplenia, or immunocompromised conditions. People who received 1-2 doses of PPSV23 before age 65 years should receive another dose of PPSV23 vaccine at age 65 years or later if at least 5 years have passed since the previous dose. Doses of PPSV23 are not needed for people immunized with PPSV23 at or after age 65 years.    Hepatitis A vaccine. Adults who wish to be protected from this disease, have certain high-risk conditions, work with hepatitis A-infected animals, work in hepatitis A research labs, or travel to or work in countries with a high rate of hepatitis A should be immunized. Adults who were previously unvaccinated  and who anticipate close contact with an international adoptee during the first 60 days after arrival in the [place] from a country with a high rate of hepatitis A should be immunized.    Hepatitis B vaccine. Adults should be immunized if they wish to be protected from this disease, have certain high-risk conditions, may be exposed to blood or other infectious body fluids, are household contacts or sex partners of hepatitis B positive people, are clients or workers in certain care facilities, or travel to or work in countries with a high rate of hepatitis B.   Preventive Service / Frequency   Ages 65 and over  Blood pressure check.  Lipid and cholesterol check.  Lung cancer screening. / Every year if you are aged 55-80 years and have a 30-pack-year history of smoking and currently smoke or have quit within the past 15 years. Yearly screening is stopped once you   have quit smoking for at least 15 years or develop a health problem that would prevent you from having lung cancer treatment.  Fecal occult blood test (FOBT) of stool. You may not have to do this test if you get a colonoscopy every 10 years.  Flexible sigmoidoscopy** or colonoscopy.** / Every 5 years for a flexible sigmoidoscopy or every 10 years for a colonoscopy beginning at age 50 and continuing until age 75.  Hepatitis C blood test.** / For all people born from 1945 through 1965 and any individual with known risks for hepatitis C.  Abdominal aortic aneurysm (AAA) screening./ Screening current or former smokers or have Hypertension.  Skin self-exam. / Monthly.  Influenza vaccine. / Every year.  Tetanus, diphtheria, and acellular pertussis (Tdap/Td) vaccine.** / 1 dose of Td every 10 years.   Zoster vaccine.** / 1 dose for adults aged 60 years or older.         Pneumococcal 13-valent conjugate (PCV13) vaccine.    Pneumococcal polysaccharide (PPSV23) vaccine.     Hepatitis A vaccine.** / Consult your health  care provider.  Hepatitis B vaccine.** / Consult your health care provider. Screening for abdominal aortic aneurysm (AAA)  by ultrasound is recommended for people who have history of high blood pressure or who are current or former smokers. 

## 2014-10-13 NOTE — Progress Notes (Signed)
Patient ID: [name], male   DOB: 1933-11-22, 81 y.o.   MRN: [id number]   [facility] VISIT AND CPE  Assessment:    1. Essential hypertension -cut atenolol to 25 mg daily -if continued palpitation or BP greater than 150/90 consistently can take other half -monitor at home -diet and exercise - TSH  2. Atherosclerosis of native coronary artery of native heart without angina pectoris -no problems with chest pain -followed by cards -pt does have nitro but has never taken it  3. Abdominal aortic aneurysm -no further change per formal ultrasound. -cont to monitor with aortic ultrasounds  4. PVD (peripheral vascular disease) -control HTN -control cholesterol  5. Gastroesophageal reflux disease without esophagitis -cont omeprazole as needed -avoid trigger foods  6. History of IBS -history of constipation -currently on high fiber foods  7. Vitamin B 12 deficiency -cont supplement -check today  8. Prediabetes  - Hemoglobin A1c - Insulin, random  9. Vitamin D deficiency -cont supplement - Vit D  25 hydroxy (rtn osteoporosis monitoring)  10. Hyperlipidemia -not at goal but patient refuses any further meds - Lipid panel  11. Encounter for long-term (current) use of medications -see below  12. Medication management  - CBC with Differential/Platelet - BASIC METABOLIC PANEL WITH GFR - Hepatic function panel - Magnesium - Urinalysis, Routine w reflex microscopic (not at [facility]) - Microalbumin / creatinine urine ratio - EKG 12-Lead  13. Other fatigue  - Iron and TIBC - Vitamin B12  14. Screening for rectal cancer  - POC Hemoccult Bld/Stl (3-Cd Home Screen); Future     Plan:   During the course of the visit the patient was educated and counseled about appropriate screening and preventive services including:    Pneumococcal vaccine   Influenza vaccine  Td vaccine  Screening electrocardiogram  Bone densitometry  screening  Colorectal cancer screening  Diabetes screening  Glaucoma screening  Nutrition counseling   Advanced directives: requested  Screening recommendations, referrals: Vaccinations: Immunization History  Administered Date(s) Administered  . Influenza, High Dose Seasonal PF 12/30/2013  . Influenza-Unspecified 01/13/2013  . Pneumococcal Conjugate-13 02/14/2014  . Pneumococcal-Unspecified 06/15/2004  . Tetanus 10/01/2012  . Zoster 10/01/2012    Tdap vaccine not indicated Influenza vaccine not indicated Pneumococcal vaccine not indicated Prevnar vaccine not indicated Shingles vaccine not indicated Hep B vaccine not indicated  Nutrition assessed and recommended  Colonoscopy not indicated Recommended yearly ophthalmology/optometry visit for glaucoma screening and checkup Recommended yearly dental visit for hygiene and checkup Advanced directives - not indicated  Conditions/risks identified: BMI: Discussed weight loss, diet, and increase physical activity.  Increase physical activity: AHA recommends 150 minutes of physical activity a week.  Medications reviewed Diabetes is at goal, ACE/ARB therapy: No, Reason not on Ace Inhibitor/ARB therapy:  not currently indicated at this time Urinary Incontinence is not an issue: discussed non pharmacology and pharmacology options.  Fall risk: low- discussed PT, home fall assessment, medications.   Subjective:    [name] is a 81 y.o. male who presents for Medicare Annual Wellness Visit and complete physical.  Date of last medicare wellness visit is 02/14/14  He has had elevated blood pressure since 2005. Patient does have a history of ASCAD with PTCA in 2005 and 2011.  He has had some stenting performed. His blood pressure has been controlled at home, today their BP is BP: 124/70 mmHg He does workout.  He reports that he is walking 2 miles per day.   He denies chest  pain, shortness of breath, dizziness.  He reports that  he stays dizzy due to his gabapentin.  He reports that he does get dizzy when he stands very quickly.     He is not on cholesterol medication and denies myalgias. His cholesterol is not at goal. The cholesterol last visit was:  Lab Results  Component Value Date   CHOL 162 05/20/2014   HDL 32* 05/20/2014   LDLCALC 103* 05/20/2014   TRIG 133 05/20/2014   CHOLHDL 5.1 05/20/2014  Patient reports that he has tried multiple different cholesterol medications, and refuses to take any further medications.   He has had diabetes for 8 years since 2008. He has not been working on diet and exercise for diabetes, and denies foot ulcerations, hyperglycemia, hypoglycemia , increased appetite, nausea, polydipsia, polyuria, visual disturbances, vomiting and weight loss. Last A1C in the office was:  Lab Results  Component Value Date   HGBA1C 6.1* 05/20/2014  He reports that he does have parasthesias of his bilateral feet to his mid shins.  He reports that his  Blood sugar has been 88-135 first thing in the morning.    Patient is on Vitamin D supplement.   Lab Results  Component Value Date   VD25OH 43 05/20/2014     Patient  Reports that he is having a lot more back pain which has been bothering him for a long.  He reports that his pain is very severe and it is preventing him for walking for a long distance.  He has tried to have shots and seen pain management but would like to see a neurosurgeon.      Names of Other Physician/Practitioners you currently use: 1. [facility] here for primary care 2. [name] and [name], eye doctor, last visit 6 months ago 3.Dentures, patient does not see a dentist  Patient Care Team: Dennis Pinto, MD as PCP - [place], PA-C as Physician Assistant (Cardiology) Dennis Perla, MD as Consulting Physician (Cardiology) Dennis Shipper, MD as Consulting Physician (Gastroenterology)  Medication Review: Current  Outpatient Prescriptions on File Prior to Visit  Medication Sig Dispense Refill  . aspirin 325 MG tablet Take 325 mg by mouth daily.      Marland Kitchen atenolol (TENORMIN) 50 MG tablet TAKE 1 TABLET BY MOUTH EVERY DAY 90 tablet 4  . Cyanocobalamin (VITAMIN B-12 IJ) Inject 1 mL as directed every 30 (thirty) days.      . Flaxseed, Linseed, 1000 MG CAPS Take 1 capsule by mouth daily.      Marland Kitchen gabapentin (NEURONTIN) 300 MG capsule Take 1 capsule (300 mg total) by mouth 3 (three) times daily. For neuritis pain 90 capsule 99  . glucose blood (ONE TOUCH ULTRA TEST) test strip Check glucose 1 time daily.  DX-R73.09 100 each 0  . meloxicam (MOBIC) 15 MG tablet TAKE 1 TABLET BY MOUTH EVERY DAY 30 tablet 0  . multivitamin (THERAGRAN) per tablet Take 1 tablet by mouth daily.      . nitroGLYCERIN (NITROSTAT) 0.4 MG SL tablet DISSOLVE 1 TABLET UNDER TONGUE EVERY 5 MINUTES AS NEEDED UP TO 3 DOSES 25 tablet 3  . omeprazole (PRILOSEC) 20 MG capsule TAKE 2 CAPSULES BY MOUTH EVERY DAY FOR INDIGESTION 180 capsule 3  . Tuberculin-Allergy Syringes 28G X 1/2" 1 ML MISC Use to inject B12 1 time a month. 12 each 0   No current facility-administered medications on file prior to visit.    Current  Problems (verified) Patient Active Problem List   Diagnosis Date Noted  . PVD (peripheral vascular disease) 01/01/2014  . Encounter for long-term (current) use of medications 10/05/2013  . Hyperlipidemia 04/11/2013  . Prediabetes 04/11/2013  . Hypertension   . Vitamin D deficiency   . Vitamin B 12 deficiency   . Coronary atherosclerosis 02/27/2009  . Abdominal aortic aneurysm 02/27/2009  . GERD 02/27/2009  . History of IBS 02/27/2009    Screening Tests Health Maintenance  Topic Date Due  . OPHTHALMOLOGY EXAM  05/30/1943  . COLONOSCOPY  03/17/2013  . URINE MICROALBUMIN  10/08/2014  . HEMOGLOBIN A1C  11/18/2014  . INFLUENZA VACCINE  12/01/2014  . FOOT EXAM  02/15/2015  . TETANUS/TDAP  10/02/2022  . ZOSTAVAX  Completed  .  PNA vac Low Risk Adult  Completed    Immunization History  Administered Date(s) Administered  . Influenza, High Dose Seasonal PF 12/30/2013  . Influenza-Unspecified 01/13/2013  . Pneumococcal Conjugate-13 02/14/2014  . Pneumococcal-Unspecified 06/15/2004  . Tetanus 10/01/2012  . Zoster 10/01/2012    Preventative care: Last colonoscopy: 2012, patient refuses any further colonoscopies  History reviewed: allergies, current medications, past family history, past medical history, past social history, past surgical history and problem list  Risk Factors: Tobacco History  Substance Use Topics  . Smoking status: Former Smoker    Quit date: 07/16/1963  . Smokeless tobacco: Never Used  . Alcohol Use: No   He does not smoke.  Patient is not a former smoker. Are there smokers in your home (other than you)?  No  Alcohol Current alcohol use: none  Caffeine Current caffeine use: denies use  Exercise Current exercise: walking  Nutrition/Diet Current diet: "not what it should be".  Cardiac risk factors: advanced age (older than 47 for men, 44 for women), diabetes mellitus, dyslipidemia, family history of premature cardiovascular disease, hypertension, male gender and sedentary lifestyle.  Depression Screen (Note: if answer to either of the following is "Yes", a more complete depression screening is indicated)   Q1: Over the past two weeks, have you felt down, depressed or hopeless? No  Q2: Over the past two weeks, have you felt little interest or pleasure in doing things? No  Have you lost interest or pleasure in daily life? No  Do you often feel hopeless? No  Do you cry easily over simple problems? No  Activities of Daily Living In your present state of health, do you have any difficulty performing the following activities?:  Driving? No Managing money?  No Feeding yourself? No Getting from bed to chair? No Climbing a flight of stairs? No Preparing food and eating?:  No Bathing or showering? No Getting dressed: No Getting to the toilet? No Using the toilet:No Moving around from place to place: No In the past year have you fallen or had a near fall?:No   Are you sexually active?  Yes  Do you have more than one partner?  No  Vision Difficulties: No  Hearing Difficulties: No Do you often ask people to speak up or repeat themselves? No Do you experience ringing or noises in your ears? No Do you have difficulty understanding soft or whispered voices? No  Cognition  Do you feel that you have a problem with memory?No  Do you often misplace items? No  Do you feel safe at home?  Yes  Advanced directives Does patient have a [place]? Yes Does patient have a Living Will? Yes  Past Medical History  Diagnosis  Date  . Coronary atherosclerosis of unspecified type of vessel, native or graft   . Aneurysm of iliac artery   . Personal history of other diseases of digestive system   . Other and unspecified hyperlipidemia   . Colon polyps   . Gastritis, chronic   . IBS (irritable bowel syndrome)   . Hypertension   . Diabetes mellitus     Diet control   . Vitamin D deficiency   . Vitamin B 12 deficiency   . Esophageal reflux     Past Surgical History  Procedure Laterality Date  . Knee surgery    . Bladder surgery    . Coronary angioplasty with stent placement      Review of Systems  Constitutional: Negative for fever, chills, weight loss and malaise/fatigue.  HENT: Positive for congestion. Negative for ear pain, nosebleeds and sore throat.   Eyes: Negative.   Respiratory: Negative for cough, shortness of breath and wheezing.   Cardiovascular: Negative for chest pain, palpitations and leg swelling.  Gastrointestinal: Positive for constipation. Negative for heartburn, nausea, vomiting, diarrhea, blood in stool and melena.  Genitourinary: Negative.  Negative for dysuria, urgency, frequency and hematuria.  Musculoskeletal:  Positive for back pain.  Skin: Negative.   Neurological: Negative for dizziness, sensory change, loss of consciousness and headaches.  Psychiatric/Behavioral: Negative for depression. The patient is not nervous/anxious and does not have insomnia.   All other systems reviewed and are negative.    Objective:     BP 124/70 mmHg  Pulse 56  Temp(Src) 97.3 F (36.3 C)  Resp 16  Ht 6' 0.5" (1.842 m)  Wt 198 lb (89.812 kg)  BMI 26.47 kg/m2  General Appearance:  Alert  WD/WN, male  in no apparent distress. Eyes: PERRLA, EOMs nl, conjunctiva normal, normal fundi and vessels. Sinuses: No frontal/maxillary tenderness ENT/Mouth: EACs patent / TMs  nl. Nares clear without erythema, swelling, mucoid exudates. Oral hygiene is good. No erythema, swelling, or exudate. Tongue normal, non-obstructing. Tonsils not swollen or erythematous. Hearing normal.  Neck: Supple, thyroid normal. No bruits, nodes or JVD. Respiratory: Respiratory effort normal.  BS equal and clear bilateral without rales, rhonci, wheezing or stridor. Cardio: Heart sounds are bradycardic and regular but distant S1S2. Peripheral pulses are normal and equal bilaterally without edema. No aortic or femoral bruits. Chest: symmetric with normal excursions and percussion.  Mildly tender to palpation of the right costovertebral angle.  Abdomen: Flat, soft, with nl bowel sounds. Nontender, no guarding, rebound, hernias, masses, or organomegaly.  Lymphatics: Non tender without lymphadenopathy.  Genitourinary: No hernias.Testes nl.  Musculoskeletal: Full ROM all peripheral extremities, joint stability, 5/5 strength, and normal gait.  Feet without ulcers, sensation to light touch intact, normal monofilament testing, normal proprioception.   Skin: Warm and dry without rashes, lesions, cyanosis, clubbing or  Ecchymosis.  Small 1 cm x 0.5 cm erythematous macule on the left temple with no crusting, ulcerations, or scaling.    Neuro: Cranial nerves  intact, reflexes equal bilaterally. Normal muscle tone, no cerebellar symptoms. Sensation intact.  Pysch: Alert and oriented X 3 with normal affect, insight and judgment appropriate.   Cognitive Testing  Alert? Yes  Normal Appearance? Yes  Oriented to person? Yes  Place? Yes   Time? Yes  Recall of three objects?  Yes  Can perform simple calculations? Yes  Displays appropriate judgment? Yes  Can read the correct time from a watch/clock? Yes  Medicare Attestation I have personally reviewed: The patient's medical and social history  Their use of alcohol, tobacco or illicit drugs Their current medications and supplements The patient's functional ability including ADLs,fall risks, home safety risks, cognitive, and hearing and visual impairment Diet and physical activities Evidence for depression or mood disorders  The patient's weight, height, BMI, and visual acuity have been recorded in the chart.  I have made referrals, counseling, and provided education to the patient based on review of the above and I have provided the patient with a written personalized care plan for preventive services.  Over 40 minutes of exam, counseling, chart review was performed.   Starlyn Skeans, PA-C   10/13/2014

## 2014-10-14 LAB — URINALYSIS, ROUTINE W REFLEX MICROSCOPIC
Bilirubin Urine: NEGATIVE
Glucose, UA: NEGATIVE mg/dL
Hgb urine dipstick: NEGATIVE
Ketones, ur: NEGATIVE mg/dL
Leukocytes, UA: NEGATIVE
Nitrite: NEGATIVE
Protein, ur: NEGATIVE mg/dL
Specific Gravity, Urine: 1.015 (ref 1.005–1.030)
Urobilinogen, UA: 0.2 mg/dL (ref 0.0–1.0)
pH: 5.5 (ref 5.0–8.0)

## 2014-10-14 LAB — MICROALBUMIN / CREATININE URINE RATIO
Creatinine, Urine: 94.3 mg/dL
Microalb Creat Ratio: 4.2 mg/g (ref 0.0–30.0)
Microalb, Ur: 0.4 mg/dL (ref <2.0)

## 2014-10-14 LAB — VITAMIN D 25 HYDROXY (VIT D DEFICIENCY, FRACTURES): Vit D, 25-Hydroxy: 72 ng/mL (ref 30–100)

## 2014-10-14 LAB — INSULIN, RANDOM: Insulin: 8.7 u[IU]/mL (ref 2.0–19.6)

## 2014-10-22 ENCOUNTER — Other Ambulatory Visit (INDEPENDENT_AMBULATORY_CARE_PROVIDER_SITE_OTHER): Payer: Medicare Other

## 2014-10-22 DIAGNOSIS — Z1212 Encounter for screening for malignant neoplasm of rectum: Secondary | ICD-10-CM

## 2014-10-22 LAB — POC HEMOCCULT BLD/STL (HOME/3-CARD/SCREEN)
Card #2 Fecal Occult Blod, POC: NEGATIVE
Card #3 Fecal Occult Blood, POC: NEGATIVE
Fecal Occult Blood, POC: NEGATIVE

## 2014-10-24 NOTE — Addendum Note (Signed)
Addended by: Tod Abrahamsen A on: 10/24/2014 01:37 PM   Modules accepted: Orders

## 2014-10-28 ENCOUNTER — Telehealth: Payer: Self-pay | Admitting: Internal Medicine

## 2014-10-28 ENCOUNTER — Other Ambulatory Visit: Payer: Self-pay | Admitting: Internal Medicine

## 2014-10-28 DIAGNOSIS — M549 Dorsalgia, unspecified: Secondary | ICD-10-CM

## 2014-10-28 NOTE — Telephone Encounter (Signed)
Patient requesting a referral for second opinion on severe back pain, with a Neurosurgeon. Stated he requested referral at last office visit.   Please advise your recommendation.  Thank you, [name] Referral Coordinator  [facility] [phone] ext. [phone] Fax [phone]

## 2014-12-04 DIAGNOSIS — M4155 Other secondary scoliosis, thoracolumbar region: Secondary | ICD-10-CM | POA: Diagnosis not present

## 2014-12-04 DIAGNOSIS — Z6826 Body mass index (BMI) 26.0-26.9, adult: Secondary | ICD-10-CM | POA: Diagnosis not present

## 2014-12-12 DIAGNOSIS — M5416 Radiculopathy, lumbar region: Secondary | ICD-10-CM | POA: Diagnosis not present

## 2014-12-12 DIAGNOSIS — M5115 Intervertebral disc disorders with radiculopathy, thoracolumbar region: Secondary | ICD-10-CM | POA: Diagnosis not present

## 2015-01-05 ENCOUNTER — Encounter (HOSPITAL_COMMUNITY): Payer: Self-pay | Admitting: Emergency Medicine

## 2015-01-05 ENCOUNTER — Emergency Department (HOSPITAL_COMMUNITY)
Admission: EM | Admit: 2015-01-05 | Discharge: 2015-01-05 | Disposition: A | Discharge status: Home / Self Care | Payer: Medicare Other | Attending: Emergency Medicine | Admitting: Emergency Medicine

## 2015-01-05 ENCOUNTER — Emergency Department (HOSPITAL_COMMUNITY): Payer: Medicare Other

## 2015-01-05 DIAGNOSIS — G8929 Other chronic pain: Secondary | ICD-10-CM | POA: Insufficient documentation

## 2015-01-05 DIAGNOSIS — I10 Essential (primary) hypertension: Secondary | ICD-10-CM | POA: Diagnosis not present

## 2015-01-05 DIAGNOSIS — Z79899 Other long term (current) drug therapy: Secondary | ICD-10-CM | POA: Diagnosis not present

## 2015-01-05 DIAGNOSIS — Z86010 Personal history of colonic polyps: Secondary | ICD-10-CM | POA: Insufficient documentation

## 2015-01-05 DIAGNOSIS — R509 Fever, unspecified: Secondary | ICD-10-CM | POA: Diagnosis not present

## 2015-01-05 DIAGNOSIS — M545 Low back pain: Secondary | ICD-10-CM | POA: Insufficient documentation

## 2015-01-05 DIAGNOSIS — Z87891 Personal history of nicotine dependence: Secondary | ICD-10-CM | POA: Diagnosis not present

## 2015-01-05 DIAGNOSIS — I2510 Atherosclerotic heart disease of native coronary artery without angina pectoris: Secondary | ICD-10-CM | POA: Insufficient documentation

## 2015-01-05 DIAGNOSIS — R51 Headache: Secondary | ICD-10-CM | POA: Insufficient documentation

## 2015-01-05 DIAGNOSIS — E559 Vitamin D deficiency, unspecified: Secondary | ICD-10-CM | POA: Insufficient documentation

## 2015-01-05 DIAGNOSIS — M546 Pain in thoracic spine: Secondary | ICD-10-CM | POA: Insufficient documentation

## 2015-01-05 DIAGNOSIS — M4806 Spinal stenosis, lumbar region: Secondary | ICD-10-CM | POA: Diagnosis not present

## 2015-01-05 DIAGNOSIS — E538 Deficiency of other specified B group vitamins: Secondary | ICD-10-CM | POA: Insufficient documentation

## 2015-01-05 DIAGNOSIS — K219 Gastro-esophageal reflux disease without esophagitis: Secondary | ICD-10-CM | POA: Diagnosis not present

## 2015-01-05 DIAGNOSIS — E119 Type 2 diabetes mellitus without complications: Secondary | ICD-10-CM | POA: Insufficient documentation

## 2015-01-05 DIAGNOSIS — M542 Cervicalgia: Secondary | ICD-10-CM | POA: Diagnosis not present

## 2015-01-05 DIAGNOSIS — M47816 Spondylosis without myelopathy or radiculopathy, lumbar region: Secondary | ICD-10-CM | POA: Diagnosis not present

## 2015-01-05 DIAGNOSIS — Z9861 Coronary angioplasty status: Secondary | ICD-10-CM | POA: Insufficient documentation

## 2015-01-05 DIAGNOSIS — Z7982 Long term (current) use of aspirin: Secondary | ICD-10-CM | POA: Insufficient documentation

## 2015-01-05 DIAGNOSIS — M791 Myalgia: Secondary | ICD-10-CM | POA: Diagnosis not present

## 2015-01-05 DIAGNOSIS — M5489 Other dorsalgia: Secondary | ICD-10-CM | POA: Diagnosis not present

## 2015-01-05 DIAGNOSIS — M549 Dorsalgia, unspecified: Secondary | ICD-10-CM

## 2015-01-05 LAB — BASIC METABOLIC PANEL
Anion gap: 8 (ref 5–15)
BUN: 20 mg/dL (ref 6–20)
CO2: 26 mmol/L (ref 22–32)
Calcium: 9.6 mg/dL (ref 8.9–10.3)
Chloride: 104 mmol/L (ref 101–111)
Creatinine, Ser: 1.28 mg/dL — ABNORMAL HIGH (ref 0.61–1.24)
GFR calc Af Amer: 60 mL/min — ABNORMAL LOW (ref >60)
GFR calc non Af Amer: 52 mL/min — ABNORMAL LOW (ref >60)
Glucose, Bld: 132 mg/dL — ABNORMAL HIGH (ref 65–99)
Potassium: 4.2 mmol/L (ref 3.5–5.1)
Sodium: 138 mmol/L (ref 135–145)

## 2015-01-05 LAB — URINALYSIS, ROUTINE W REFLEX MICROSCOPIC
Bilirubin Urine: NEGATIVE
Glucose, UA: NEGATIVE mg/dL
Hgb urine dipstick: NEGATIVE
Ketones, ur: NEGATIVE mg/dL
Leukocytes, UA: NEGATIVE
Nitrite: NEGATIVE
Protein, ur: NEGATIVE mg/dL
Specific Gravity, Urine: 1.007 (ref 1.005–1.030)
Urobilinogen, UA: 1 mg/dL (ref 0.0–1.0)
pH: 6 (ref 5.0–8.0)

## 2015-01-05 LAB — CBC
HCT: 41.3 % (ref 39.0–52.0)
Hemoglobin: 13.5 g/dL (ref 13.0–17.0)
MCH: 31.8 pg (ref 26.0–34.0)
MCHC: 32.7 g/dL (ref 30.0–36.0)
MCV: 97.4 fL (ref 78.0–100.0)
Platelets: 180 10*3/uL (ref 150–400)
RBC: 4.24 MIL/uL (ref 4.22–5.81)
RDW: 13.1 % (ref 11.5–15.5)
WBC: 6.2 10*3/uL (ref 4.0–10.5)

## 2015-01-05 MED ORDER — IOHEXOL 300 MG/ML  SOLN
100.0000 mL | Freq: Once | INTRAMUSCULAR | Status: AC | PRN
  Administered 2015-01-05: 100 mL via INTRAVENOUS

## 2015-01-05 NOTE — ED Provider Notes (Signed)
CSN: [id number]     Arrival date & time 01/05/15  1224 History   None    Chief Complaint  Patient presents with  . Fever  . Neck Pain  . Headache     (Consider location/radiation/quality/duration/timing/severity/associated sxs/prior Treatment) Patient is a 81 y.o. male presenting with fever and back pain.  Fever Associated symptoms: headaches and myalgias   Associated symptoms: no chest pain, no congestion, no cough, no nausea, no rash and no vomiting   Back Pain Location:  Thoracic spine and lumbar spine Quality:  Aching Radiates to:  Does not radiate Pain severity:  Mild Onset quality:  Gradual Timing:  Intermittent Chronicity:  Chronic Context: not emotional stress and not falling   Relieved by:  None tried Worsened by:  Nothing tried Associated symptoms: fever and headaches   Associated symptoms: no abdominal pain and no chest pain     Past Medical History  Diagnosis Date  . Coronary atherosclerosis of unspecified type of vessel, native or graft   . Aneurysm of iliac artery   . Personal history of other diseases of digestive system   . Other and unspecified hyperlipidemia   . Colon polyps   . Gastritis, chronic   . IBS (irritable bowel syndrome)   . Hypertension   . Diabetes mellitus     Diet control   . Vitamin D deficiency   . Vitamin B 12 deficiency   . Esophageal reflux    Past Surgical History  Procedure Laterality Date  . Knee surgery    . Bladder surgery    . Coronary angioplasty with stent placement     Family History  Problem Relation Age of Onset  . Heart attack Father     died age 71  . Heart attack Brother     died age 75  . Heart attack Sister     died age 40  . Colon cancer Sister   . Liver cancer Sister   . Diabetes Maternal Grandmother   . Colon polyps Sister     and brothers x 2    Social History  Substance Use Topics  . Smoking status: Former Smoker    Quit date: 07/16/1963  . Smokeless tobacco: Never Used  . Alcohol Use: No     Review of Systems  Constitutional: Positive for fever.  HENT: Negative for congestion.   Eyes: Negative for pain.  Respiratory: Negative for cough and shortness of breath.   Cardiovascular: Negative for chest pain.  Gastrointestinal: Negative for nausea, vomiting and abdominal pain.  Endocrine: Negative for polydipsia and polyuria.  Musculoskeletal: Positive for myalgias, back pain, arthralgias and neck pain. Negative for neck stiffness.  Skin: Negative for rash.  Neurological: Positive for headaches.      Allergies  Cymbalta; Keflex; Simvastatin; Sudafed; and Prednisone  Home Medications   Prior to Admission medications   Medication Sig Start Date End Date Taking? Authorizing Provider  aspirin 325 MG tablet Take 325 mg by mouth daily.     Yes Historical Provider, MD  atenolol (TENORMIN) 50 MG tablet TAKE 1 TABLET BY MOUTH EVERY DAY Patient taking differently: TAKE 25 MG BY MOUTH ONCE DAILY 09/17/13  Yes [name], MD  CALCIUM PO Take 1 tablet by mouth daily.   Yes Historical Provider, MD  Cyanocobalamin (VITAMIN B-12 IJ) Inject 1 mL as directed every 30 (thirty) days.     Yes Historical Provider, MD  Flaxseed, Linseed, 1000 MG CAPS Take 1,000 mg by mouth daily.  Yes Historical Provider, MD  gabapentin (NEURONTIN) 300 MG capsule Take 1 capsule (300 mg total) by mouth 3 (three) times daily. For neuritis pain 10/07/13  Yes [name], MD  glucose blood (ONE TOUCH ULTRA TEST) test strip Check glucose 1 time daily.  DX-R73.09 03/24/14  Yes [name], MD  multivitamin [facility]) per tablet Take 1 tablet by mouth daily.     Yes Historical Provider, MD  nitroGLYCERIN (NITROSTAT) 0.4 MG SL tablet DISSOLVE 1 TABLET UNDER TONGUE EVERY 5 MINUTES AS NEEDED UP TO 3 DOSES 07/24/14  Yes [name], MD  omeprazole (PRILOSEC) 20 MG capsule TAKE 2 CAPSULES BY MOUTH EVERY DAY FOR INDIGESTION Patient taking differently: TAKE 20 MG BY MOUTH ONCE DAILY AS NEEDED FOR INDIGESTION  10/11/14  Yes Vicie Mutters, PA-C   BP 132/65 mmHg  Pulse 69  Temp(Src) 98.7 F (37.1 C) (Oral)  Resp 19  SpO2 96% Physical Exam  Constitutional: He is oriented to person, place, and time. He appears well-developed and well-nourished.  HENT:  Head: Normocephalic and atraumatic.  Eyes: Conjunctivae and EOM are normal.  Neck: Normal range of motion. Neck supple.  Cardiovascular: Normal rate and regular rhythm.   Pulmonary/Chest: Effort normal. No respiratory distress.  Abdominal: Soft. There is no tenderness.  Musculoskeletal: Normal range of motion. He exhibits no edema or tenderness.  Neurological: He is alert and oriented to person, place, and time.  No altered mental status, able to give full seemingly accurate history.  Face is symmetric, EOM's intact, pupils equal and reactive, vision intact, tongue and uvula midline without deviation Upper and Lower extremity motor 5/5, intact pain perception in distal extremities, 2+ reflexes in biceps, patella and achilles tendons. Finger to nose normal, heel to shin normal. Walks without assistance or evident ataxia.  Skin: Skin is warm and dry.  Nursing note and vitals reviewed.   ED Course  Procedures (including critical care time) Labs Review Labs Reviewed  BASIC METABOLIC PANEL - Abnormal; Notable for the following:    Glucose, Bld 132 (*)    Creatinine, Ser 1.28 (*)    GFR calc non Af Amer 52 (*)    GFR calc Af Amer 60 (*)    All other components within normal limits  URINALYSIS, ROUTINE W REFLEX MICROSCOPIC (NOT AT [facility])  CBC    Imaging Review Ct Thoracic Spine W Contrast  01/05/2015   CLINICAL DATA:  Back pain.  Fever 102.  Headache  EXAM: CT THORACIC AND LUMBAR SPINE WITH CONTRAST  TECHNIQUE: Multidetector CT imaging of the thoracic and lumbar spine was performed with contrast. Multiplanar CT image reconstructions were also generated.  COMPARISON:  CT abdomen pelvis 08/14/2013  CONTRAST:  100 mL Omnipaque 300 IV  FINDINGS: CT  THORACIC SPINE FINDINGS  Mild levoscoliosis in the lower thoracic spine. Normal alignment. Negative for fracture. Negative for mass lesion.  Mild thoracic disc degeneration. Mild anterior spurring on the right at T9-10 and T10-11. Negative for disc protrusion. Negative for spinal stenosis.  No evidence of discitis or osteomyelitis. Note that MRI is more sensitive than CT for detection of spinal infection.  Atherosclerotic aorta without aneurysm. Coronary calcification. Left coronary stent. Chronic lung disease with bibasilar scarring/ atelectasis.  CT LUMBAR SPINE FINDINGS  Moderate dextroscoliosis at L3-4. Negative for fracture or mass lesion. No aggressive bony process. No evidence of discitis or osteomyelitis in the lumbar spine. Note MRI is more sensitive than CT for detection of spinal infection.  L1-2: Mild disc bulging and mild facet degeneration  without spinal stenosis  L2-3: Disc bulging and mild facet degeneration without significant spinal or foraminal stenosis  L3-4:  Disc bulging and facet hypertrophy.  Mild spinal stenosis.  L4-5: Moderate disc bulging and vertebral endplate osteophyte formation. Bilateral facet hypertrophy. Moderate spinal stenosis. Mild to moderate foraminal narrowing bilaterally  L5-S1: Disc degeneration and spurring on the right with right foraminal encroachment. Possible impingement right L5 nerve root.  Atherosclerotic abdominal aorta. Aneurysmal dilatation of the distal abdominal aorta measuring 38 x 29 mm. Right iliac artery 24 mm. Left iliac artery 26 mm. No evidence of retroperitoneal hemorrhage or mass.  IMPRESSION: CT THORACIC SPINE IMPRESSION  Thoracic scoliosis and mild degenerative change. Negative for fracture or mass lesion.  CT LUMBAR SPINE IMPRESSION  Lumbar scoliosis and degenerative change. Mild spinal stenosis at L3-4. Moderate spinal stenosis at L4-5 with foraminal narrowing bilaterally. Right foraminal encroachment L5-S1  Abdominal aortic aneurysm 28 x 39 mm.  Right iliac artery 24 mm and left iliac artery 26 mm. Recommend followup by ultrasound in 2 years. This recommendation follows ACR consensus guidelines: White Paper of the ACR Incidental Findings Committee II on Vascular Findings. J Am Coll Radiol 2013; 10:789-794.  No evidence of spinal infection. Note MRI is more sensitive for spinal infection CT.   Electronically Signed   By: Franchot Gallo M.D.   On: 01/05/2015 19:33   Ct Lumbar Spine W Contrast  01/05/2015   CLINICAL DATA:  Back pain.  Fever 102.  Headache  EXAM: CT THORACIC AND LUMBAR SPINE WITH CONTRAST  TECHNIQUE: Multidetector CT imaging of the thoracic and lumbar spine was performed with contrast. Multiplanar CT image reconstructions were also generated.  COMPARISON:  CT abdomen pelvis 08/14/2013  CONTRAST:  100 mL Omnipaque 300 IV  FINDINGS: CT THORACIC SPINE FINDINGS  Mild levoscoliosis in the lower thoracic spine. Normal alignment. Negative for fracture. Negative for mass lesion.  Mild thoracic disc degeneration. Mild anterior spurring on the right at T9-10 and T10-11. Negative for disc protrusion. Negative for spinal stenosis.  No evidence of discitis or osteomyelitis. Note that MRI is more sensitive than CT for detection of spinal infection.  Atherosclerotic aorta without aneurysm. Coronary calcification. Left coronary stent. Chronic lung disease with bibasilar scarring/ atelectasis.  CT LUMBAR SPINE FINDINGS  Moderate dextroscoliosis at L3-4. Negative for fracture or mass lesion. No aggressive bony process. No evidence of discitis or osteomyelitis in the lumbar spine. Note MRI is more sensitive than CT for detection of spinal infection.  L1-2: Mild disc bulging and mild facet degeneration without spinal stenosis  L2-3: Disc bulging and mild facet degeneration without significant spinal or foraminal stenosis  L3-4:  Disc bulging and facet hypertrophy.  Mild spinal stenosis.  L4-5: Moderate disc bulging and vertebral endplate osteophyte formation.  Bilateral facet hypertrophy. Moderate spinal stenosis. Mild to moderate foraminal narrowing bilaterally  L5-S1: Disc degeneration and spurring on the right with right foraminal encroachment. Possible impingement right L5 nerve root.  Atherosclerotic abdominal aorta. Aneurysmal dilatation of the distal abdominal aorta measuring 38 x 29 mm. Right iliac artery 24 mm. Left iliac artery 26 mm. No evidence of retroperitoneal hemorrhage or mass.  IMPRESSION: CT THORACIC SPINE IMPRESSION  Thoracic scoliosis and mild degenerative change. Negative for fracture or mass lesion.  CT LUMBAR SPINE IMPRESSION  Lumbar scoliosis and degenerative change. Mild spinal stenosis at L3-4. Moderate spinal stenosis at L4-5 with foraminal narrowing bilaterally. Right foraminal encroachment L5-S1  Abdominal aortic aneurysm 28 x 39 mm. Right iliac artery 24 mm and  left iliac artery 26 mm. Recommend followup by ultrasound in 2 years. This recommendation follows ACR consensus guidelines: White Paper of the ACR Incidental Findings Committee II on Vascular Findings. J Am Coll Radiol 2013; 10:789-794.  No evidence of spinal infection. Note MRI is more sensitive for spinal infection CT.   Electronically Signed   By: Franchot Gallo M.D.   On: 01/05/2015 19:33   I have personally reviewed and evaluated these images and lab results as part of my medical decision-making.   EKG Interpretation None      MDM   Final diagnoses:  Back pain  Back pain   He 57-year-old male with history of chronic back pain presents to the emergency department today secondary to a fever on Saturday and Sunday. Also with an exacerbation of his back pain. Also has neck pain which is similar to his baseline neck pain. Had an epidural couple weeks ago which had resolved his symptoms without a return. Exam as above patient is no acute distress no rash, nuchal rigidity, neurologic symptoms, fever here. Tainted epidurals that he is CT scan of his back to x-ray to have an  abscess or discitis and it was negative. Would've preferred an MRI of the patient has stents was told to never get an MRI. Patient appeared well without persistent fever or persistent headaches. Doubt bacterial meningitis at this time, discussed this with him and he will return here for any new or worsening symptoms. His wife will bring him back for the same.  I have personally and contemperaneously reviewed labs and imaging and used in my decision making as above.   A medical screening exam was performed and I feel the patient has had an appropriate workup for their chief complaint at this time and likelihood of emergent condition existing is low. They have been counseled on decision, discharge, follow up and which symptoms necessitate immediate return to the emergency department. They or their family verbally stated understanding and agreement with plan and discharged in stable condition.     Merrily Pew, MD 01/05/15 [phone]

## 2015-01-05 NOTE — ED Notes (Addendum)
Pt states hx of back pain/problems. Pt states had an epidural around 8/12 to help with his back pain. Saturday evening began having a fever of 102, neck pain, headaches, and feeling increasingly weak. Has been taking tylenol to manage fevers.Gave pt a mask, placed on droplet precaution. Denies nausea/vomiting. Went to urgent care today, was referred here with "conern for possibility of discitis in older diabetic with fairly recent epidural injections" per Gaye Alken PA. Last took tylenol around 0830 this morning. Denies photophobia. Also states that recently he's been getting up much more frequently to go urinate, denies pain/hematuria.

## 2015-01-06 ENCOUNTER — Encounter: Payer: Self-pay | Admitting: Internal Medicine

## 2015-01-06 ENCOUNTER — Ambulatory Visit (INDEPENDENT_AMBULATORY_CARE_PROVIDER_SITE_OTHER): Payer: Medicare Other | Admitting: Internal Medicine

## 2015-01-06 VITALS — BP 106/62 | HR 72 | Temp 97.0°F | Resp 16 | Ht 72.5 in | Wt 194.2 lb

## 2015-01-06 DIAGNOSIS — R509 Fever, unspecified: Secondary | ICD-10-CM | POA: Diagnosis not present

## 2015-01-06 DIAGNOSIS — Z6825 Body mass index (BMI) 25.0-25.9, adult: Secondary | ICD-10-CM

## 2015-01-06 DIAGNOSIS — R7303 Prediabetes: Secondary | ICD-10-CM

## 2015-01-06 DIAGNOSIS — R7309 Other abnormal glucose: Secondary | ICD-10-CM | POA: Diagnosis not present

## 2015-01-06 MED ORDER — LEVOFLOXACIN 500 MG PO TABS: ORAL_TABLET | ORAL | Status: DC

## 2015-01-06 MED ORDER — PREDNISONE 20 MG PO TABS: ORAL_TABLET | ORAL | Status: DC

## 2015-01-06 NOTE — Progress Notes (Signed)
Subjective:    Patient ID: [name], male    DOB: [birth year], 81 y.o.   MRN: [id number]  HPI  This very nice 81 yo MWM with multiple medical co-morbidities including DDD had EDSI of thoracic & lumbar spine about 3 weeks ago on 12/12/2014. He reports improvement of his pain for about 2 weeks , and then on 9/3 and 9/4 he developed fevers to 102 degrees with worsening back pains and went to an Urgent Care yesterday on 9/5 and was sent to the ER for evaluation and he had a very thorough evaluation including thoracic and lumbar CT scans and also labs including U/A and CDC which were neg/nl and patient was felt to have a viral type syndrome and released for out-patient f/u. Patient disavows any congestion/respiratory sx's, CP, Abd/GI or GU sx's. Patient feels improved today. In fact he is planning to leave town in the am to drive to [facility] for an antique car show.   Medication Sig  . aspirin 325 MG tablet Take 325 mg by mouth daily.    Marland Kitchen atenolol 50 MG tablet TAKE 1 TABLET BY MOUTH EVERY DAY (Patient taking differently: TAKE 25 MG BY MOUTH ONCE DAILY)  . CALCIUM PO Take 1 tablet by mouth daily.  . Cyanocobalamin (VITAMIN B-12 IJ) Inject 1 mL as directed every 30 (thirty) days.    . Flaxseed, Linseed, 1000 MG CAPS Take 1,000 mg by mouth daily.   Marland Kitchen gabapentin (NEURONTIN) 300 MG capsule Take 1 capsule (300 mg total) by mouth 3 (three) times daily. For neuritis pain  . multivitamin (THERAGRAN) per tablet Take 1 tablet by mouth daily.    . nitroGLYCERIN (NITROSTAT) 0.4 MG SL tablet DISSOLVE 1 TABLET UNDER TONGUE EVERY 5 MINUTES AS NEEDED UP TO 3 DOSES  . omeprazole (PRILOSEC) 20 MG capsule TAKE 20 MG BY MOUTH ONCE DAILY AS NEEDED FOR INDIGESTION)   Allergies  Allergen Reactions  . Cymbalta [Duloxetine Hcl]     Dizziness  . Keflex [Cephalexin] Other (See Comments)    Reaction: unknown   . Simvastatin Other (See Comments)    Reaction: unknown   . Sudafed [Pseudoephedrine]     Dizziness   . Prednisone Rash   Past Medical History  Diagnosis Date  . Coronary atherosclerosis of unspecified type of vessel, native or graft   . Aneurysm of iliac artery   . Personal history of other diseases of digestive system   . Other and unspecified hyperlipidemia   . Colon polyps   . Gastritis, chronic   . IBS (irritable bowel syndrome)   . Hypertension   . Diabetes mellitus     Diet control   . Vitamin D deficiency   . Vitamin B 12 deficiency   . Esophageal reflux    Past Surgical History  Procedure Laterality Date  . Knee surgery    . Bladder surgery    . Coronary angioplasty with stent placement     Review of Systems 10 point systems review negative except as above.    Objective:   Physical Exam  BP 106/62 mmHg  Pulse 72  Temp(Src) 97 F (36.1 C)  Resp 16  Ht 6' 0.5" (1.842 m)  Wt 194 lb 3.2 oz (88.089 kg)  BMI 25.96 kg/m2  HEENT - Eac's patent. TM's Nl. EOM's full. PERRLA. NasoOroPharynx clear. Neck - supple. Nl Thyroid. Carotids 2+ & No bruits, nodes, JVD Chest - Clear equal BS w/o Rales, rhonchi, wheezes. Cor - Nl HS. RRR w/o  sig MGR. PP 1(+). No edema. Abd - No palpable organomegaly, masses or tenderness. BS nl. MS- FROM w/o deformities. Muscle power, tone and bulk Nl. Gait Nl. Neuro - No obvious Cr N abnormalities. Sensory, motor and Cerebellar functions appear Nl w/o focal abnormalities. Psyche - Mental status normal & appropriate.  No delusions, ideations or obvious mood abnormalities. Skin - clear w/o rash.    Assessment & Plan:   1. Fever, presumed viral illness  - in consideration of patient leaving town in the am , he's given an Rx for Levaquin 500 mg #5 & Prednisone taper in the event to use if he develops respiratory or UTI sx's and advised to call if he has any questions or go to an ER.

## 2015-01-06 NOTE — Patient Instructions (Signed)

## 2015-01-19 ENCOUNTER — Ambulatory Visit (INDEPENDENT_AMBULATORY_CARE_PROVIDER_SITE_OTHER): Payer: Medicare Other | Admitting: Internal Medicine

## 2015-01-19 ENCOUNTER — Encounter: Payer: Self-pay | Admitting: Internal Medicine

## 2015-01-19 VITALS — BP 88/44 | HR 72 | Temp 97.5°F | Resp 16 | Ht 72.5 in | Wt 193.0 lb

## 2015-01-19 DIAGNOSIS — R509 Fever, unspecified: Secondary | ICD-10-CM

## 2015-01-19 DIAGNOSIS — Z6826 Body mass index (BMI) 26.0-26.9, adult: Secondary | ICD-10-CM | POA: Diagnosis not present

## 2015-01-19 DIAGNOSIS — I951 Orthostatic hypotension: Secondary | ICD-10-CM

## 2015-01-19 DIAGNOSIS — E860 Dehydration: Secondary | ICD-10-CM

## 2015-01-19 DIAGNOSIS — R7303 Prediabetes: Secondary | ICD-10-CM

## 2015-01-19 DIAGNOSIS — Z79899 Other long term (current) drug therapy: Secondary | ICD-10-CM

## 2015-01-19 DIAGNOSIS — R7309 Other abnormal glucose: Secondary | ICD-10-CM | POA: Diagnosis not present

## 2015-01-19 LAB — BASIC METABOLIC PANEL WITH GFR
BUN: 21 mg/dL (ref 7–25)
CO2: 32 mmol/L — ABNORMAL HIGH (ref 20–31)
Calcium: 9.4 mg/dL (ref 8.6–10.3)
Chloride: 99 mmol/L (ref 98–110)
Creat: 1.36 mg/dL — ABNORMAL HIGH (ref 0.70–1.18)
GFR, Est African American: 57 mL/min — ABNORMAL LOW (ref >=60)
GFR, Est Non African American: 49 mL/min — ABNORMAL LOW (ref >=60)
Glucose, Bld: 105 mg/dL — ABNORMAL HIGH (ref 65–99)
Potassium: 4.5 mmol/L (ref 3.5–5.3)
Sodium: 141 mmol/L (ref 135–146)

## 2015-01-19 LAB — CBC WITH DIFFERENTIAL/PLATELET
Basophils Absolute: 0 10*3/uL (ref 0.0–0.1)
Basophils Relative: 0 % (ref 0–1)
Eosinophils Absolute: 0.2 10*3/uL (ref 0.0–0.7)
Eosinophils Relative: 3 % (ref 0–5)
HCT: 41.2 % (ref 39.0–52.0)
Hemoglobin: 13.6 g/dL (ref 13.0–17.0)
Lymphocytes Relative: 15 % (ref 12–46)
Lymphs Abs: 1.2 10*3/uL (ref 0.7–4.0)
MCH: 31.6 pg (ref 26.0–34.0)
MCHC: 33 g/dL (ref 30.0–36.0)
MCV: 95.6 fL (ref 78.0–100.0)
MPV: 9.1 fL (ref 8.6–12.4)
Monocytes Absolute: 0.5 10*3/uL (ref 0.1–1.0)
Monocytes Relative: 6 % (ref 3–12)
Neutro Abs: 5.9 10*3/uL (ref 1.7–7.7)
Neutrophils Relative %: 76 % (ref 43–77)
Platelets: 184 10*3/uL (ref 150–400)
RBC: 4.31 MIL/uL (ref 4.22–5.81)
RDW: 14.2 % (ref 11.5–15.5)
WBC: 7.8 10*3/uL (ref 4.0–10.5)

## 2015-01-19 LAB — HEPATIC FUNCTION PANEL
ALT: 15 U/L (ref 9–46)
AST: 16 U/L (ref 10–35)
Albumin: 3.2 g/dL — ABNORMAL LOW (ref 3.6–5.1)
Alkaline Phosphatase: 65 U/L (ref 40–115)
Bilirubin, Direct: 0.1 mg/dL (ref <=0.2)
Indirect Bilirubin: 0.4 mg/dL (ref 0.2–1.2)
Total Bilirubin: 0.5 mg/dL (ref 0.2–1.2)
Total Protein: 5.6 g/dL — ABNORMAL LOW (ref 6.1–8.1)

## 2015-01-19 MED ORDER — PREDNISONE 20 MG PO TABS: ORAL_TABLET | ORAL | Status: DC

## 2015-01-19 MED ORDER — SULFAMETHOXAZOLE-TRIMETHOPRIM 800-160 MG PO TABS: ORAL_TABLET | ORAL | Status: DC

## 2015-01-19 NOTE — Patient Instructions (Signed)
Fever, Adult °A fever is a higher than normal body temperature. In an adult, an oral temperature around 98.6° F (37° C) is considered normal. A temperature of 100.4° F (38° C) or higher is generally considered a fever. Mild or moderate fevers generally have no long-term effects and often do not require treatment. Extreme fever (greater than or equal to 106° F or 41.1° C) can cause seizures. The sweating that may occur with repeated or prolonged fever may cause dehydration. Elderly people can develop confusion during a fever. °A measured temperature can vary with: °· Age. °· Time of day. °· Method of measurement (mouth, underarm, rectal, or ear). °The fever is confirmed by taking a temperature with a thermometer. Temperatures can be taken different ways. Some methods are accurate and some are not. °· An oral temperature is used most commonly. Electronic thermometers are fast and accurate. °· An ear temperature will only be accurate if the thermometer is positioned as recommended by the manufacturer. °· A rectal temperature is accurate and done for those adults who have a condition where an oral temperature cannot be taken. °· An underarm (axillary) temperature is not accurate and not recommended. °Fever is a symptom, not a disease.  °CAUSES  °· Infections commonly cause fever. °· Some noninfectious causes for fever include: °· Some arthritis conditions. °· Some thyroid or adrenal gland conditions. °· Some immune system conditions. °· Some types of cancer. °· A medicine reaction. °· High doses of certain street drugs such as methamphetamine. °· Dehydration. °· Exposure to high outside or room temperatures. °· Occasionally, the source of a fever cannot be determined. This is sometimes called a "fever of unknown origin" (FUO). °· Some situations may lead to a temporary rise in body temperature that may go away on its own. Examples are: °· Childbirth. °· Surgery. °· Intense exercise. °HOME CARE INSTRUCTIONS  °· Take  appropriate medicines for fever. Follow dosing instructions carefully. If you use acetaminophen to reduce the fever, be careful to avoid taking other medicines that also contain acetaminophen. Do not take aspirin for a fever if you are younger than age 19. There is an association with Reye's syndrome. Reye's syndrome is a rare but potentially deadly disease. °· If an infection is present and antibiotics have been prescribed, take them as directed. Finish them even if you start to feel better. °· Rest as needed. °· Maintain an adequate fluid intake. To prevent dehydration during an illness with prolonged or recurrent fever, you may need to drink extra fluid. Drink enough fluids to keep your urine clear or pale yellow. °· Sponging or bathing with room temperature water may help reduce body temperature. Do not use ice water or alcohol sponge baths. °· Dress comfortably, but do not over-bundle. °SEEK MEDICAL CARE IF:  °· You are unable to keep fluids down. °· You develop vomiting or diarrhea. °· You are not feeling at least partly better after 3 days. °· You develop new symptoms or problems. °SEEK IMMEDIATE MEDICAL CARE IF:  °· You have shortness of breath or trouble breathing. °· You develop excessive weakness. °· You are dizzy or you faint. °· You are extremely thirsty or you are making little or no urine. °· You develop new pain that was not there before (such as in the head, neck, chest, back, or abdomen). °· You have persistent vomiting and diarrhea for more than 1 to 2 days. °· You develop a stiff neck or your eyes become sensitive to light. °· You develop a   skin rash.  You have a fever or persistent symptoms for more than 2 to 3 days.  You have a fever and your symptoms suddenly get worse. MAKE SURE YOU:   Understand these instructions.  Will watch your condition.  Will get help right away if you are not doing well or get worse.  ++++++++++++++++++++++++++++++++++++++ Dehydration, Adult Dehydration  means your body does not have as much fluid as it needs. Your kidneys, brain, and heart will not work properly without the right amount of fluids and salt.  HOME CARE  Ask your doctor how to replace body fluid losses (rehydrate).  Drink enough fluids to keep your pee (urine) clear or pale yellow.  Drink small amounts of fluids often if you feel sick to your stomach (nauseous) or throw up (vomit).  Eat like you normally do.  Avoid:  Foods or drinks high in sugar.  Bubbly (carbonated) drinks.  Juice.  Very hot or cold fluids.  Drinks with caffeine.  Fatty, greasy foods.  Alcohol.  Tobacco.  Eating too much.  Gelatin desserts.  Wash your hands to avoid spreading germs (bacteria, viruses).  Only take medicine as told by your doctor.  Keep all doctor visits as told. GET HELP RIGHT AWAY IF:   You cannot drink something without throwing up.  You get worse even with treatment.  Your vomit has blood in it or looks greenish.  Your poop (stool) has blood in it or looks black and tarry.  You have not peed in 6 to 8 hours.  You pee a small amount of very dark pee.  You have a fever.  You pass out (faint).  You have belly (abdominal) pain that gets worse or stays in one spot (localizes).  You have a rash, stiff neck, or bad headache.  You get easily annoyed, sleepy, or are hard to wake up.  You feel weak, dizzy, or very thirsty. MAKE SURE YOU:   Understand these instructions.  Will watch your condition.  Will get help right away if you are not doing well or get worse. Document Released: 02/12/2009 Document Revised: 07/11/2011 Document Reviewed: 12/06/2010 [facility] Patient Information 2015 [place], [place]. This information is not intended to replace advice given to you by your health care provider. Make sure you discuss any questions you have with your health care provider.

## 2015-01-20 ENCOUNTER — Other Ambulatory Visit: Payer: Self-pay | Admitting: Internal Medicine

## 2015-01-20 LAB — URINALYSIS, ROUTINE W REFLEX MICROSCOPIC
Bilirubin Urine: NEGATIVE
Hgb urine dipstick: NEGATIVE
Ketones, ur: NEGATIVE
Leukocytes, UA: NEGATIVE
Nitrite: NEGATIVE
Protein, ur: NEGATIVE
Specific Gravity, Urine: 1.019 (ref 1.001–1.035)
pH: 6.5 (ref 5.0–8.0)

## 2015-01-20 LAB — URINE CULTURE
Colony Count: NO GROWTH
Organism ID, Bacteria: NO GROWTH

## 2015-01-22 ENCOUNTER — Ambulatory Visit (INDEPENDENT_AMBULATORY_CARE_PROVIDER_SITE_OTHER): Payer: Medicare Other | Admitting: Internal Medicine

## 2015-01-22 ENCOUNTER — Encounter: Payer: Self-pay | Admitting: Internal Medicine

## 2015-01-22 VITALS — BP 130/70 | HR 64 | Temp 97.5°F | Resp 16 | Ht 72.5 in | Wt 195.0 lb

## 2015-01-22 DIAGNOSIS — B349 Viral infection, unspecified: Secondary | ICD-10-CM

## 2015-01-22 NOTE — Progress Notes (Signed)
Subjective:    Patient ID: [name], male    DOB: 1933/12/27, 81 y.o.   MRN: [id number]  HPI  Patient returns today for f/u of a recent febrile illness with fever 7 chills and empiric laboratory evaluation was unrevealing. Patient was initially treated empirically with Levaquin and then Septra and reports feeling much improved.   Medication Sig  . aspirin 325 MG tablet Take 325 mg by mouth daily.    Marland Kitchen atenolol  50 MG tablet TAKE 1 TABLET BY MOUTH EVERY DAY (Patient taking differently: TAKE 25 MG BY MOUTH ONCE DAILY)  . CALCIUM  Take 1 tablet by mouth daily.  . Cyanocobalamin (VITAMIN B-12 IJ) Inject 1 mL as directed every 30 (thirty) days.    . Flaxseed, Linseed, 1000 MG CAPS Take 1,000 mg by mouth daily.   Marland Kitchen gabapentin (NEURONTIN) 300 MG  TAKE ONE CAPSULE BY MOUTH THREE TIMES DAILY FOR NEURITIS PAIN  . THERAGRAN) per tablet Take 1 tablet by mouth daily.    Marland Kitchen NITROSTAT 0.4 MG SL tablet DISSOLVE 1 TABLET UNDER TONGUE EVERY 5 MINUTES AS NEEDED UP TO 3 DOSES  . omeprazole (PRILOSEC) 20 MG   TAKE 20 MG BY MOUTH ONCE DAILY AS NEEDED FOR INDIGESTION)  . predniSONE (DELTASONE) 20 MG  1 tab 3 x day for 3 days, then 1 tab 2 x day for 3 days, then 1 tab 1 x day for 5 days  . sulfamethoxazole-trimethoprim  800-160  Take 1 tablet 2 x daily with food for infection   Allergies  Allergen Reactions  . Cymbalta [Duloxetine Hcl]     Dizziness  . Keflex [Cephalexin] Other (See Comments)    Reaction: unknown   . Simvastatin Other (See Comments)    Reaction: unknown   . Sudafed [Pseudoephedrine]     Dizziness  . Prednisone Rash   Past Medical History  Diagnosis Date  . Coronary atherosclerosis of unspecified type of vessel, native or graft   . Aneurysm of iliac artery   . Personal history of other diseases of digestive system   . Other and unspecified hyperlipidemia   . Colon polyps   . Gastritis, chronic   . IBS (irritable bowel syndrome)   . Hypertension   . Diabetes mellitus    Diet control   . Vitamin D deficiency   . Vitamin B 12 deficiency   . Esophageal reflux    Past Surgical History  Procedure Laterality Date  . Knee surgery    . Bladder surgery    . Coronary angioplasty with stent placement     Review of Systems 10 point systems review negative except as above.    Objective:   Physical Exam  BP 130/70 mmHg  Pulse 64  Temp(Src) 97.5 F (36.4 C) (Temporal)  Resp 16  Ht 6' 0.5" (1.842 m)  Wt 195 lb (88.451 kg)  BMI 26.07 kg/m2  HEENT - Eac's patent. TM's Nl. EOM's full. PERRLA. NasoOroPharynx clear. Neck - supple. Nl Thyroid. Carotids 2+ & No bruits, nodes, JVD Chest - Clear equal BS w/o Rales, rhonchi, wheezes. Cor - Nl HS. RRR w/o sig MGR. PP 1(+). No edema. Abd - No palpable organomegaly, masses or tenderness. BS nl. MS- FROM w/o deformities. Muscle power, tone and bulk Nl. Gait Nl. Neuro - No obvious Cr N abnormalities. Sensory, motor and Cerebellar functions appear Nl w/o focal abnormalities. Psyche - Mental status normal & appropriate.  No delusions, ideations or obvious mood abnormalities.    Assessment &  Plan:   1. Viral illness, by exclusion.

## 2015-01-25 NOTE — Progress Notes (Addendum)
Subjective:    Patient ID: [name], male    DOB: 02/22/1934, 81 y.o.   MRN: [id number]  HPI  Patient presents with c/o intermittent fever, chills, sweats, occasional dizziness and vague generalized paresthesias. Denies any respiratory sx's as dyspnea, cough, CP or congestion. No GI or GU sx's. Recently took a course of Levaquin.   Medication Sig  . aspirin 325 MG tablet Take 325 mg by mouth daily.    Marland Kitchen atenolol  50 MG tablet TAKE 1 TABLET BY MOUTH EVERY DAY (Patient taking differently: TAKE 25 MG BY MOUTH ONCE DAILY)  . CALCIUM PO Take 1 tablet by mouth daily.  Marland Kitchen VITAMIN B-12 IJ Inject 1 mL as directed every 30 (thirty) days.    . Flaxseed  1000 MG Take 1,000 mg by mouth daily.   . multivitamin   Take 1 tablet by mouth daily.    Marland Kitchen NITROSTAT 0.4 MG SL DISSOLVE 1 TABLET UNDER TONGUE EVERY 5 MINUTES AS NEEDED UP TO 3 DOSES  . omeprazole  20 MG  TAKE 20 MG  ONCE DAILY AS NEEDED   . gabapentin 300 MG  Take 1 cap 3 times daily. For neuritis pain   Allergies  Allergen Reactions  . Cymbalta [Duloxetine Hcl]     Dizziness  . Keflex [Cephalexin] Other (See Comments)    Reaction: unknown   . Simvastatin Other (See Comments)    Reaction: unknown   . Sudafed [Pseudoephedrine]     Dizziness  . Prednisone Rash   Past Medical History  Diagnosis Date  . Coronary atherosclerosis of unspecified type of vessel, native or graft   . Aneurysm of iliac artery   . Personal history of other diseases of digestive system   . Other and unspecified hyperlipidemia   . Colon polyps   . Gastritis, chronic   . IBS (irritable bowel syndrome)   . Hypertension   . Diabetes mellitus     Diet control   . Vitamin D deficiency   . Vitamin B 12 deficiency   . Esophageal reflux    Past Surgical History  Procedure Laterality Date  . Knee surgery    . Bladder surgery    . Coronary angioplasty with stent placement     Review of Systems   10 point systems review negative except as above.     Objective:   Physical Exam  BP 88/44 - rechecked at 106/70  Pulse 72  Temp(Src) 97.5 F (36.4 C)  Resp 16  Ht 6' 0.5" (1.842 m)  Wt 193 lb (87.544 kg)  BMI 25.80 kg/m2  HEENT - Eac's patent. TM's Nl. EOM's full. PERRLA. NasoOroPharynx clear. Neck - supple. Nl Thyroid. Carotids 2+ & No bruits, nodes, JVD Chest - Clear equal BS w/o Rales, rhonchi, wheezes. Cor - Nl HS. RRR w/o sig MGR. PP 1(+). No edema. Abd - No palpable organomegaly, masses or tenderness. BS nl. MS- FROM w/o deformities. Muscle power, tone and bulk Nl. Gait Nl. Neuro - No obvious Cr N abnormalities. Sensory, motor and Cerebellar functions appear Nl w/o focal abnormalities. Psyche - Mental status normal & appropriate.  No delusions, ideations or obvious mood abnormalities.    Assessment & Plan:   1. Orthostatic hypotension  Encouraged liberal fluid intake and salt intake  2. Dehydration   3. Fever and chills  - CBC with Differential/Platelet - Urine culture - Urinalysis, Routine w reflex microscopic (not at [facility]) - sulfamethoxazole-trimethoprim (BACTRIM DS,SEPTRA DS) 800-160 MG per tablet; Take 1 tablet  2 x daily with food for infection  Dispense: 30 tablet; Refill: 0 - predniSONE (DELTASONE) 20 MG tablet; 1 tab 3 x day for 3 days, then 1 tab 2 x day for 3 days, then 1 tab 1 x day for 5 days  Dispense: 20 tablet; Refill: 0  4. Medication management  - BASIC METABOLIC PANEL WITH GFR - Hepatic function panel   - Discussed meds/SE's and rov in a few days for shoty f/u

## 2015-02-02 ENCOUNTER — Encounter: Payer: Self-pay | Admitting: Internal Medicine

## 2015-02-02 ENCOUNTER — Ambulatory Visit (INDEPENDENT_AMBULATORY_CARE_PROVIDER_SITE_OTHER): Payer: Medicare Other | Admitting: Internal Medicine

## 2015-02-02 VITALS — BP 100/58 | HR 72 | Temp 97.5°F | Resp 16 | Ht 72.5 in | Wt 190.8 lb

## 2015-02-02 DIAGNOSIS — R531 Weakness: Secondary | ICD-10-CM

## 2015-02-02 DIAGNOSIS — Z6827 Body mass index (BMI) 27.0-27.9, adult: Secondary | ICD-10-CM | POA: Insufficient documentation

## 2015-02-02 DIAGNOSIS — Z23 Encounter for immunization: Secondary | ICD-10-CM | POA: Diagnosis not present

## 2015-02-02 DIAGNOSIS — B349 Viral infection, unspecified: Secondary | ICD-10-CM | POA: Diagnosis not present

## 2015-02-02 DIAGNOSIS — Z79899 Other long term (current) drug therapy: Secondary | ICD-10-CM | POA: Diagnosis not present

## 2015-02-02 DIAGNOSIS — R7303 Prediabetes: Secondary | ICD-10-CM | POA: Diagnosis not present

## 2015-02-02 DIAGNOSIS — Z6825 Body mass index (BMI) 25.0-25.9, adult: Secondary | ICD-10-CM

## 2015-02-02 LAB — HEPATIC FUNCTION PANEL
ALT: 15 U/L (ref 9–46)
AST: 16 U/L (ref 10–35)
Albumin: 3.3 g/dL — ABNORMAL LOW (ref 3.6–5.1)
Alkaline Phosphatase: 54 U/L (ref 40–115)
Bilirubin, Direct: 0.2 mg/dL (ref <=0.2)
Indirect Bilirubin: 0.5 mg/dL (ref 0.2–1.2)
Total Bilirubin: 0.7 mg/dL (ref 0.2–1.2)
Total Protein: 5.6 g/dL — ABNORMAL LOW (ref 6.1–8.1)

## 2015-02-02 LAB — BASIC METABOLIC PANEL WITH GFR
BUN: 20 mg/dL (ref 7–25)
CO2: 31 mmol/L (ref 20–31)
Calcium: 9.3 mg/dL (ref 8.6–10.3)
Chloride: 102 mmol/L (ref 98–110)
Creat: 1.09 mg/dL (ref 0.70–1.18)
GFR, Est African American: 74 mL/min (ref >=60)
GFR, Est Non African American: 64 mL/min (ref >=60)
Glucose, Bld: 137 mg/dL — ABNORMAL HIGH (ref 65–99)
Potassium: 4.8 mmol/L (ref 3.5–5.3)
Sodium: 142 mmol/L (ref 135–146)

## 2015-02-02 LAB — MAGNESIUM: Magnesium: 1.9 mg/dL (ref 1.5–2.5)

## 2015-02-02 NOTE — Progress Notes (Signed)
Subjective:    Patient ID: [name], male    DOB: 1933-11-11, 81 y.o.   MRN: [id number]  HPI   Patient returns for a 2sd f/u visit for a nonspecific febrile illness with negative w/u for UTI, altho he was emperically treated with Levaquin and then Septra. In retrospect it is suspected that he had a viral illness with c/o now centering on feelings of unsteadiness and some vague diffuse arthralgias.    Medication Sig  . aspirin 325 MG tablet Take 325 mg by mouth daily.    Marland Kitchen atenolol (TENORMIN) 50 MG tablet TAKE 1 TABLET BY MOUTH EVERY DAY (Patient taking differently: TAKE 25 MG BY MOUTH ONCE DAILY)  . CALCIUM PO Take 1 tablet by mouth daily.  . Cyanocobalamin (VITAMIN B-12 IJ) Inject 1 mL as directed every 30 (thirty) days.    . Flaxseed, Linseed, 1000 MG CAPS Take 1,000 mg by mouth daily.   Marland Kitchen gabapentin (NEURONTIN) 300 MG capsule TAKE ONE CAPSULE BY MOUTH THREE TIMES DAILY FOR NEURITIS PAIN  . glucose blood (ONE TOUCH ULTRA TEST) test strip Check glucose 1 time daily.  DX-R73.09  . multivitamin (THERAGRAN) per tablet Take 1 tablet by mouth daily.    . nitroGLYCERIN (NITROSTAT) 0.4 MG SL tablet DISSOLVE 1 TABLET UNDER TONGUE EVERY 5 MINUTES AS NEEDED UP TO 3 DOSES  . omeprazole (PRILOSEC) 20 MG capsule TAKE 2 CAPSULES BY MOUTH EVERY DAY FOR INDIGESTION (Patient taking differently: TAKE 20 MG BY MOUTH ONCE DAILY AS NEEDED FOR INDIGESTION)   Allergies  Allergen Reactions  . Cymbalta [Duloxetine Hcl]     Dizziness  . Keflex [Cephalexin] Other (See Comments)    Reaction: unknown   . Simvastatin Other (See Comments)    Reaction: unknown   . Sudafed [Pseudoephedrine]     Dizziness  . Prednisone Rash   Past Medical History  Diagnosis Date  . Coronary atherosclerosis of unspecified type of vessel, native or graft   . Aneurysm of iliac artery   . Personal history of other diseases of digestive system   . Other and unspecified hyperlipidemia   . Colon polyps   . Gastritis, chronic    . IBS (irritable bowel syndrome)   . Hypertension   . Diabetes mellitus     Diet control   . Vitamin D deficiency   . Vitamin B 12 deficiency   . Esophageal reflux    Past Surgical History  Procedure Laterality Date  . Knee surgery    . Bladder surgery    . Coronary angioplasty with stent placement     Review of Systems  10 point systems review negative except as above.    Objective:   Physical Exam BP 100/58 mmHg  Pulse 72  Temp(Src) 97.5 F (36.4 C)  Resp 16  Ht 6' 0.5" (1.842 m)  Wt 190 lb 12.8 oz (86.546 kg)  BMI 25.51 kg/m2  HEENT - Eac's patent. TM's Nl. EOM's full. PERRLA. NasoOroPharynx clear. Neck - supple. Nl Thyroid. Carotids 2+ & No bruits, nodes, JVD Chest - Clear equal BS w/o Rales, rhonchi, wheezes. Cor - Nl HS. RRR w/o sig MGR. PP 1(+). No edema. Abd - No palpable organomegaly, masses or tenderness. BS nl. MS- FROM w/o deformities. Muscle power, tone and bulk Nl. Gait Nl. Neuro - No obvious Cr N abnormalities. Sensory, motor and Cerebellar functions appear Nl w/o focal abnormalities. Psyche - Mental status normal & appropriate.  No delusions, ideations or obvious mood abnormalities. Skin - No  Rashes, Cyanosis or icterus.    Assessment & Plan:   1. Viral syndrome   2. Weakness  - possibly related to Gabapentin & recommended taper dose from 3 tabs to 1 tab qhs.   - ROV 2 weeks to recheck  3. Medication management  - CBC with Differential/Platelet - Hepatic function panel - Magnesium - BASIC METABOLIC PANEL WITH GFR  4. Need for prophylactic vaccination and inoculation against influenza  - Flu vaccine HIGH DOSE PF (Fluzone High dose)

## 2015-02-03 LAB — CBC WITH DIFFERENTIAL/PLATELET
Basophils Absolute: 0 10*3/uL (ref 0.0–0.1)
Basophils Relative: 0 % (ref 0–1)
Eosinophils Absolute: 0.2 10*3/uL (ref 0.0–0.7)
Eosinophils Relative: 2 % (ref 0–5)
HCT: 38.8 % — ABNORMAL LOW (ref 39.0–52.0)
Hemoglobin: 12.6 g/dL — ABNORMAL LOW (ref 13.0–17.0)
Lymphocytes Relative: 18 % (ref 12–46)
Lymphs Abs: 1.7 10*3/uL (ref 0.7–4.0)
MCH: 31.4 pg (ref 26.0–34.0)
MCHC: 32.5 g/dL (ref 30.0–36.0)
MCV: 96.8 fL (ref 78.0–100.0)
MPV: 9.5 fL (ref 8.6–12.4)
Monocytes Absolute: 0.6 10*3/uL (ref 0.1–1.0)
Monocytes Relative: 7 % (ref 3–12)
Neutro Abs: 6.7 10*3/uL (ref 1.7–7.7)
Neutrophils Relative %: 73 % (ref 43–77)
Platelets: 192 10*3/uL (ref 150–400)
RBC: 4.01 MIL/uL — ABNORMAL LOW (ref 4.22–5.81)
RDW: 14.6 % (ref 11.5–15.5)
WBC: 9.2 10*3/uL (ref 4.0–10.5)

## 2015-02-16 ENCOUNTER — Encounter: Payer: Self-pay | Admitting: Internal Medicine

## 2015-02-16 ENCOUNTER — Ambulatory Visit (INDEPENDENT_AMBULATORY_CARE_PROVIDER_SITE_OTHER): Payer: Medicare Other | Admitting: Physician Assistant

## 2015-02-16 ENCOUNTER — Other Ambulatory Visit: Payer: Self-pay | Admitting: Internal Medicine

## 2015-02-16 VITALS — BP 136/76 | HR 68 | Temp 97.6°F | Resp 16 | Ht 72.5 in | Wt 194.4 lb

## 2015-02-16 DIAGNOSIS — B349 Viral infection, unspecified: Secondary | ICD-10-CM | POA: Diagnosis not present

## 2015-02-16 DIAGNOSIS — Z79899 Other long term (current) drug therapy: Secondary | ICD-10-CM

## 2015-02-16 LAB — HEPATIC FUNCTION PANEL
ALT: 13 U/L (ref 9–46)
AST: 22 U/L (ref 10–35)
Albumin: 3.6 g/dL (ref 3.6–5.1)
Alkaline Phosphatase: 68 U/L (ref 40–115)
Bilirubin, Direct: 0.1 mg/dL
Indirect Bilirubin: 0.3 mg/dL (ref 0.2–1.2)
Total Bilirubin: 0.4 mg/dL (ref 0.2–1.2)
Total Protein: 6.4 g/dL (ref 6.1–8.1)

## 2015-02-16 LAB — CBC WITH DIFFERENTIAL/PLATELET
Basophils Absolute: 0.1 10*3/uL (ref 0.0–0.1)
Basophils Relative: 1 % (ref 0–1)
Eosinophils Absolute: 0.3 10*3/uL (ref 0.0–0.7)
Eosinophils Relative: 4 % (ref 0–5)
HCT: 39.1 % (ref 39.0–52.0)
Hemoglobin: 12.8 g/dL — ABNORMAL LOW (ref 13.0–17.0)
Lymphocytes Relative: 35 % (ref 12–46)
Lymphs Abs: 2.5 10*3/uL (ref 0.7–4.0)
MCH: 31.8 pg (ref 26.0–34.0)
MCHC: 32.7 g/dL (ref 30.0–36.0)
MCV: 97 fL (ref 78.0–100.0)
MPV: 8.5 fL — ABNORMAL LOW (ref 8.6–12.4)
Monocytes Absolute: 0.6 10*3/uL (ref 0.1–1.0)
Monocytes Relative: 9 % (ref 3–12)
Neutro Abs: 3.7 10*3/uL (ref 1.7–7.7)
Neutrophils Relative %: 51 % (ref 43–77)
Platelets: 335 10*3/uL (ref 150–400)
RBC: 4.03 MIL/uL — ABNORMAL LOW (ref 4.22–5.81)
RDW: 14.8 % (ref 11.5–15.5)
WBC: 7.2 10*3/uL (ref 4.0–10.5)

## 2015-02-16 LAB — BASIC METABOLIC PANEL WITHOUT GFR
BUN: 14 mg/dL (ref 7–25)
CO2: 29 mmol/L (ref 20–31)
Calcium: 9.4 mg/dL (ref 8.6–10.3)
Chloride: 105 mmol/L (ref 98–110)
Creat: 1.08 mg/dL (ref 0.70–1.18)
GFR, Est African American: 75 mL/min
GFR, Est Non African American: 65 mL/min
Glucose, Bld: 88 mg/dL (ref 65–99)
Potassium: 4.6 mmol/L (ref 3.5–5.3)
Sodium: 142 mmol/L (ref 135–146)

## 2015-02-16 NOTE — Progress Notes (Signed)
Subjective:    Patient ID: [name], male    DOB: [birth year], 81 y.o.   MRN: [id number]  HPI 81 y.o. WM with history of dizziness, joint pain, presents for 2 week follow up. His gabapentin was decreased from 3 to 1 and he states that he has been feeling better and has been drinking more water. He has had some cramping/pain bilateral hands/thumbs, no weakness, numbness, tingling.   Blood pressure 136/76, pulse 68, temperature 97.6 F (36.4 C), resp. rate 16, height 6' 0.5" (1.842 m), weight 194 lb 6.4 oz (88.179 kg).  Current Outpatient Prescriptions on File Prior to Visit  Medication Sig Dispense Refill  . aspirin 325 MG tablet Take 325 mg by mouth daily.      Marland Kitchen CALCIUM PO Take 1 tablet by mouth daily.    . Cyanocobalamin (VITAMIN B-12 IJ) Inject 1 mL as directed every 30 (thirty) days.      . Flaxseed, Linseed, 1000 MG CAPS Take 1,000 mg by mouth daily.     Marland Kitchen gabapentin (NEURONTIN) 300 MG capsule TAKE ONE CAPSULE BY MOUTH THREE TIMES DAILY FOR NEURITIS PAIN 90 capsule PRN  . glucose blood (ONE TOUCH ULTRA TEST) test strip Check glucose 1 time daily.  DX-R73.09 100 each 0  . multivitamin (THERAGRAN) per tablet Take 1 tablet by mouth daily.      . nitroGLYCERIN (NITROSTAT) 0.4 MG SL tablet DISSOLVE 1 TABLET UNDER TONGUE EVERY 5 MINUTES AS NEEDED UP TO 3 DOSES 25 tablet 3  . omeprazole (PRILOSEC) 20 MG capsule TAKE 2 CAPSULES BY MOUTH EVERY DAY FOR INDIGESTION (Patient taking differently: TAKE 20 MG BY MOUTH ONCE DAILY AS NEEDED FOR INDIGESTION) 180 capsule 3   No current facility-administered medications on file prior to visit.    Past Medical History  Diagnosis Date  . Coronary atherosclerosis of unspecified type of vessel, native or graft   . 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 Personal history of other diseases of digestive system   . Other and unspecified hyperlipidemia   . Colon polyps   . Gastritis, chronic   . IBS (irritable bowel syndrome)   . Hypertension   .  Diabetes mellitus     Diet control   . Vitamin D deficiency   . Vitamin B 12 deficiency   . Esophageal reflux     Review of Systems  Constitutional: Negative for fever and chills.  HENT: Negative for congestion, ear pain, nosebleeds and sore throat.   Eyes: Negative.   Respiratory: Negative for cough, shortness of breath and wheezing.   Cardiovascular: Negative for chest pain, palpitations and leg swelling.  Gastrointestinal: Negative for nausea, vomiting, diarrhea, constipation and blood in stool.  Genitourinary: Negative.  Negative for dysuria, urgency, frequency and hematuria.  Musculoskeletal: Positive for back pain.  Skin: Negative.   Neurological: Negative for dizziness and headaches.  Psychiatric/Behavioral: The patient is not nervous/anxious.   All other systems reviewed and are negative.      Objective:   Physical Exam  Constitutional: He is oriented to person, place, and time. He appears well-developed and well-nourished.  HENT:  Head: Normocephalic and atraumatic.  Right Ear: External ear normal.  Left Ear: External ear normal.  Mouth/Throat: Oropharynx is clear and moist.  Eyes: Conjunctivae and EOM are normal. Pupils are equal, round, and reactive to light.  Neck: Normal range of motion. Neck supple.  Cardiovascular: Normal rate, regular rhythm and normal heart sounds.   Pulmonary/Chest: Effort normal and breath sounds  normal.  Abdominal: Soft. Bowel sounds are normal.  Musculoskeletal: Normal range of motion.  Neurological: He is alert and oriented to person, place, and time. No cranial nerve deficit.  Skin: Skin is warm and dry.  Psychiatric: He has a normal mood and affect. His behavior is normal.      Assessment & Plan:  Viral syndrome- Continue to push fluids, check CBC/BMP-  Can try trial off gabapentin, follow up is symptoms start again, discussed voltern gel.

## 2015-02-16 NOTE — Patient Instructions (Addendum)
Can try to stop the gabapentin to see if this helps with dizziness If you get pain you can get back on it, 1/2-1 tablet at night.   Magnesium low add 250 mg with food to prevent diarrhea. Magnesium may help with muscle cramps, constipation, vitamin D and potassium absorption.   Can try voltern gel for hands if they continue to hurt.

## 2015-02-26 DIAGNOSIS — M5134 Other intervertebral disc degeneration, thoracic region: Secondary | ICD-10-CM | POA: Diagnosis not present

## 2015-02-26 DIAGNOSIS — M9902 Segmental and somatic dysfunction of thoracic region: Secondary | ICD-10-CM | POA: Diagnosis not present

## 2015-03-05 DIAGNOSIS — M5134 Other intervertebral disc degeneration, thoracic region: Secondary | ICD-10-CM | POA: Diagnosis not present

## 2015-03-05 DIAGNOSIS — M9902 Segmental and somatic dysfunction of thoracic region: Secondary | ICD-10-CM | POA: Diagnosis not present

## 2015-03-19 DIAGNOSIS — M5134 Other intervertebral disc degeneration, thoracic region: Secondary | ICD-10-CM | POA: Diagnosis not present

## 2015-03-19 DIAGNOSIS — M9902 Segmental and somatic dysfunction of thoracic region: Secondary | ICD-10-CM | POA: Diagnosis not present

## 2015-03-31 ENCOUNTER — Encounter: Payer: Self-pay | Admitting: Internal Medicine

## 2015-03-31 ENCOUNTER — Ambulatory Visit (INDEPENDENT_AMBULATORY_CARE_PROVIDER_SITE_OTHER): Payer: Medicare Other | Admitting: Internal Medicine

## 2015-03-31 VITALS — BP 132/70 | HR 60 | Temp 97.1°F | Resp 16 | Ht 72.0 in | Wt 195.2 lb

## 2015-03-31 DIAGNOSIS — I10 Essential (primary) hypertension: Secondary | ICD-10-CM

## 2015-03-31 DIAGNOSIS — M7712 Lateral epicondylitis, left elbow: Secondary | ICD-10-CM | POA: Diagnosis not present

## 2015-03-31 DIAGNOSIS — R7303 Prediabetes: Secondary | ICD-10-CM | POA: Diagnosis not present

## 2015-03-31 DIAGNOSIS — Z6825 Body mass index (BMI) 25.0-25.9, adult: Secondary | ICD-10-CM | POA: Diagnosis not present

## 2015-03-31 MED ORDER — DEXAMETHASONE 1 MG PO TABS: ORAL_TABLET | ORAL | Status: DC

## 2015-03-31 MED ORDER — DEXAMETHASONE SODIUM PHOSPHATE 100 MG/10ML IJ SOLN: 10.0000 mg | Freq: Once | INTRAMUSCULAR | Status: DC

## 2015-03-31 NOTE — Progress Notes (Signed)
Subjective:    Patient ID: [name], male    DOB: [birth year], 81 y.o.   MRN: [id number]  HPI  This very nice 81 yo MWM with HTN, HLD, PreDM presents for check-up and also c/o bilat elbow pains x 1 month R>>L.   Medication Sig  . aspirin 325 MG tablet Take 325 mg by mouth daily.    Marland Kitchen atenolol (TENORMIN) 50 MG tablet TAKE 1 TABLET BY MOUTH EVERY DAY  . CALCIUM PO Take 1 tablet by mouth daily.  . Cyanocobalamin (VITAMIN B-12 IJ) Inject 1 mL as directed every 30 (thirty) days.    . Flaxseed, Linseed, 1000 MG CAPS Take 1,000 mg by mouth daily.   Marland Kitchen gabapentin (NEURONTIN) 300 MG capsule TAKE ONE CAPSULE BY MOUTH THREE TIMES DAILY FOR NEURITIS PAIN  . glucose blood (ONE TOUCH ULTRA TEST) test strip Check glucose 1 time daily.  DX-R73.09  . multivitamin (THERAGRAN) per tablet Take 1 tablet by mouth daily.    . nitroGLYCERIN (NITROSTAT) 0.4 MG SL tablet DISSOLVE 1 TABLET UNDER TONGUE EVERY 5 MINUTES AS NEEDED UP TO 3 DOSES  . omeprazole (PRILOSEC) 20 MG capsule TAKE 2 CAPSULES BY MOUTH EVERY DAY FOR INDIGESTION (Patient taking differently: TAKE 20 MG BY MOUTH ONCE DAILY AS NEEDED FOR INDIGESTION)   Allergies  Allergen Reactions  . Cymbalta [Duloxetine Hcl]     Dizziness  . Keflex [Cephalexin] Other (See Comments)    Reaction: unknown   . Simvastatin Other (See Comments)    Reaction: unknown   . Sudafed [Pseudoephedrine]     Dizziness  . Prednisone Rash   Past Medical History  Diagnosis Date  . Coronary atherosclerosis of unspecified type of vessel, native or graft   . 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 Personal history of other diseases of digestive system   . Other and unspecified hyperlipidemia   . Colon polyps   . Gastritis, chronic   . IBS (irritable bowel syndrome)   . Hypertension   . Diabetes mellitus     Diet control   . Vitamin D deficiency   . Vitamin B 12 deficiency   . Esophageal reflux    Past Surgical History  Procedure Laterality Date  . Knee surgery     . Bladder surgery    . Coronary angioplasty with stent placement      Review of Systems 10 point systems review negative except as above.    Objective:   Physical Exam  BP 132/70 mmHg  Pulse 60  Temp(Src) 97.1 F (36.2 C)  Resp 16  Ht 6' (1.829 m)  Wt 195 lb 3.2 oz (88.542 kg)  BMI 26.47 kg/m2  HEENT - Eac's patent. TM's Nl. EOM's full. PERRLA. NasoOroPharynx clear. Neck - supple. Nl Thyroid. Carotids 2+ & No bruits, nodes, JVD Chest - Clear equal BS w/o Rales, rhonchi, wheezes. Cor - Nl HS. RRR w/o sig MGR. PP 1(+). No edema. Abd - No palpable organomegaly, masses or tenderness. BS nl. MS- FROM w/o deformities. Muscle power, tone and bulk Nl. Gait Nl.(+) tender left elbow lateral epicondyle. After informed consent and aseptic prep with alcohol at the trigger pt of the L elbow Lat epicondyle a mixture of 1 ml lidocaine 1% and 1 cc (10 mg) dexamethasone was infiltrated into the tendon insertion with immediate relief and sterile Band-Aid was applied.  Neuro - No obvious Cr N abnormalities. Sensory, motor and Cerebellar functions appear Nl w/o focal abnormalities. Psyche - Mental status normal & appropriate.  No delusions, ideations or obvious mood abnormalities.'    Assessment & Plan:   1. Essential hypertension   2. Lateral epicondylitis of elbow, left  - dexamethasone (DECADRON) injection 10 mg; Inject 1 mL (10 mg total) into the muscle once. - dexamethasone (DECADRON) 1 MG tablet; Take 1 tab 3 x day - 3 days, then 2 x day - 3 days, then 1 tab daily  Dispense: 20 tablet; Refill: 0  - Discussed meds/SE's - ROV - prn

## 2015-04-16 DIAGNOSIS — M9902 Segmental and somatic dysfunction of thoracic region: Secondary | ICD-10-CM | POA: Diagnosis not present

## 2015-04-16 DIAGNOSIS — M5134 Other intervertebral disc degeneration, thoracic region: Secondary | ICD-10-CM | POA: Diagnosis not present

## 2015-04-23 ENCOUNTER — Other Ambulatory Visit: Payer: Self-pay | Admitting: Cardiology

## 2015-04-23 DIAGNOSIS — I714 Abdominal aortic aneurysm, without rupture, unspecified: Secondary | ICD-10-CM

## 2015-05-07 ENCOUNTER — Ambulatory Visit (HOSPITAL_COMMUNITY)
Admission: RE | Admit: 2015-05-07 | Discharge: 2015-05-07 | Disposition: A | Discharge status: Home / Self Care | Payer: Medicare Other | Source: Ambulatory Visit | Attending: Cardiology | Admitting: Cardiology

## 2015-05-07 DIAGNOSIS — I10 Essential (primary) hypertension: Secondary | ICD-10-CM | POA: Diagnosis not present

## 2015-05-07 DIAGNOSIS — I714 Abdominal aortic aneurysm, without rupture, unspecified: Secondary | ICD-10-CM

## 2015-05-07 DIAGNOSIS — E119 Type 2 diabetes mellitus without complications: Secondary | ICD-10-CM | POA: Diagnosis not present

## 2015-05-07 DIAGNOSIS — I723 Aneurysm of iliac artery: Secondary | ICD-10-CM | POA: Insufficient documentation

## 2015-05-07 DIAGNOSIS — I700 Atherosclerosis of aorta: Secondary | ICD-10-CM | POA: Insufficient documentation

## 2015-05-07 DIAGNOSIS — M5134 Other intervertebral disc degeneration, thoracic region: Secondary | ICD-10-CM | POA: Diagnosis not present

## 2015-05-07 DIAGNOSIS — M9902 Segmental and somatic dysfunction of thoracic region: Secondary | ICD-10-CM | POA: Diagnosis not present

## 2015-05-14 ENCOUNTER — Ambulatory Visit (INDEPENDENT_AMBULATORY_CARE_PROVIDER_SITE_OTHER): Payer: Medicare Other | Admitting: Internal Medicine

## 2015-05-14 ENCOUNTER — Encounter: Payer: Self-pay | Admitting: Internal Medicine

## 2015-05-14 VITALS — BP 130/76 | HR 64 | Temp 97.8°F | Resp 16 | Ht 72.5 in | Wt 196.2 lb

## 2015-05-14 DIAGNOSIS — M7712 Lateral epicondylitis, left elbow: Secondary | ICD-10-CM | POA: Diagnosis not present

## 2015-05-14 DIAGNOSIS — I10 Essential (primary) hypertension: Secondary | ICD-10-CM | POA: Diagnosis not present

## 2015-05-14 MED ORDER — DEXAMETHASONE SODIUM PHOSPHATE 10 MG/ML IJ SOLN: 10.0000 mg | Freq: Once | INTRAMUSCULAR | Status: DC

## 2015-05-14 MED ORDER — DEXAMETHASONE SODIUM PHOSPHATE 100 MG/10ML IJ SOLN
10.0000 mg | Freq: Once | INTRAMUSCULAR | Status: AC
  Administered 2015-05-14: 10 mg via INTRAMUSCULAR

## 2015-05-14 NOTE — Patient Instructions (Signed)
Tennis Elbow Tennis elbow (lateral epicondylitis) is inflammation of the outer tendons of your forearm close to your elbow. Your tendons attach your muscles to your bones. The outer tendons of your forearm are used to extend your wrist, and they attach on the outside part of your elbow. Tennis elbow is often found in people who play tennis, but anyone may get the condition from repeatedly extending the wrist or turning the forearm. CAUSES This condition is caused by repeatedly extending your wrist and using your hands. It can result from sports or work that requires repetitive forearm movements. Tennis elbow may also be caused by an injury. RISK FACTORS You have a higher risk of developing tennis elbow if you play tennis or another racquet sport. You also have a higher risk if you frequently use your hands for work. This condition is also more likely to develop in:  Musicians.  Carpenters, painters, and plumbers.  Cooks.  Cashiers.  People who work in [employer].  Architect workers.  Butchers.  People who use computers. SYMPTOMS Symptoms of this condition include:  Pain and tenderness in your forearm and the outer part of your elbow. You may only feel the pain when you use your arm, or you may feel it even when you are not using your arm.  A burning feeling that runs from your elbow through your arm.  Weak grip in your hands. DIAGNOSIS  This condition may be diagnosed by medical history and physical exam. You may also have other tests, including:  X-rays.  MRI. TREATMENT Your health care provider will recommend lifestyle adjustments, such as resting and icing your arm. Treatment may also include:  Medicines for inflammation. This may include shots of cortisone if your pain continues.  Physical therapy. This may include massage or exercises.  An elbow brace. Surgery may eventually be recommended if your pain does not go away with treatment. HOME CARE  INSTRUCTIONS Activity  Rest your elbow and wrist as directed by your health care provider. Try to avoid any activities that caused the problem until your health care provider says that you can do them again.  If a physical therapist teaches you exercises, do all of them as directed.  If you lift an object, lift it with your palm facing upward. This lowers the stress on your elbow. Lifestyle  If your tennis elbow is caused by sports, check your equipment and make sure that:  You are using it correctly.  It is the best fit for you.  If your tennis elbow is caused by work, take breaks frequently, if you are able. Talk with your manager about how to best perform tasks in a way that is safe.  If your tennis elbow is caused by computer use, talk with your manager about any changes that can be made to your work environment. General Instructions  If directed, apply ice to the painful area:  Put ice in a plastic bag.  Place a towel between your skin and the bag.  Leave the ice on for 20 minutes, 2-3 times per day.  Take medicines only as directed by your health care provider.  If you were given a brace, wear it as directed by your health care provider.  Keep all follow-up visits as directed by your health care provider. This is important. SEEK MEDICAL CARE IF:  Your pain does not get better with treatment.  Your pain gets worse.  You have numbness or weakness in your forearm, hand, or fingers.

## 2015-05-14 NOTE — Progress Notes (Signed)
  Subjective:    Patient ID: [name], male    DOB: 07/03/1933, 82 y.o.   MRN: [id number]  HPI Patient return for 6 week f/u after a steroid injection to the L elbow for Epicondylitis with initial good response and resolution of sx's, but then gradual return of increasing discomfort and requests a 2sd injection. Denies hx/o injury.   Medication Sig  . aspirin 325 MG tablet Take 325 mg by mouth daily.    Marland Kitchen atenolol  50 MG tablet TAKE 1 TABLET BY MOUTH EVERY DAY  . CALCIUM  Take 1 tablet by mouth daily.  Marland Kitchen VITAMIN B-12 IJ Inject 1 mL as directed every 30 (thirty) days.    . Flaxseed 1000 MG CAPS Take 1,000 mg by mouth daily.   Marland Kitchen gabapentin  300 MG capsule TAKE ONE CAPSULE BY MOUTH THREE TIMES DAILY FOR NEURITIS PAIN  . THERAGRAN multivitamin  Take 1 tablet by mouth daily.    Marland Kitchen NITROSTAT 0.4 MG SL tablet DISSOLVE 1 TABLET UNDER TONGUE EVERY 5 MINUTES AS NEEDED UP TO 3 DOSES  . omeprazole  20 MG capsule  TAKE 20 MG BY MOUTH ONCE DAILY AS NEEDED FOR INDIGESTION)   Allergies  Allergen Reactions  . Cymbalta [Duloxetine Hcl]     Dizziness  . Keflex [Cephalexin] Other (See Comments)    Reaction: unknown   . Simvastatin Other (See Comments)    Reaction: unknown   . Sudafed [Pseudoephedrine]     Dizziness  . Prednisone Rash   Past Medical History  Diagnosis Date  . Coronary atherosclerosis of unspecified type of vessel, native or graft   . 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 Personal history of other diseases of digestive system   . Other and unspecified hyperlipidemia   . Colon polyps   . Gastritis, chronic   . IBS (irritable bowel syndrome)   . Hypertension   . Diabetes mellitus     Diet control   . Vitamin D deficiency   . Vitamin B 12 deficiency   . Esophageal reflux    Review of Systems   10 point systems review negative except as above.    Objective:   Physical Exam  BP 130/76 mmHg  Pulse 64  Temp(Src) 97.8 F (36.6 C)  Resp 16  Ht 6' 0.5" (1.842 m)  Wt 196 lb  3.2 oz (88.996 kg)  BMI 26.23 kg/m2  HEENT - Eac's patent. TM's Nl. EOM's full. PERRLA. NasoOroPharynx clear. Neck - supple. Nl Thyroid. Carotids 2+ & No bruits, nodes, JVD Chest - Clear equal BS w/o Rales, rhonchi, wheezes. Cor - Nl HS. RRR w/o sig MGR.  No edema. MS- FROM w/o deformities. Muscle power, tone and bulk Nl. Gait Nl. (+) tender at L elbow lateral epicondyle.   - After informed consent and aseptic prep of the  L elbow, the area was infiltrated with a mixture of 1 cc Lidocaine 1% and 1 cc (10 mg) Dexamethasone and Band-Aid was applied.    Neuro - Nl w/o focal abnormalities.    Assessment & Plan:   1. Epicondylitis, lateral (tennis elbow), left  - dexamethasone (DECADRON) injection 10 mg; Inject 1 mL (10 mg total) into the muscle once.  - advise dis sx's return or persist, then ortho referral.  2. HTN, controlled

## 2015-06-04 DIAGNOSIS — M9902 Segmental and somatic dysfunction of thoracic region: Secondary | ICD-10-CM | POA: Diagnosis not present

## 2015-06-04 DIAGNOSIS — M5134 Other intervertebral disc degeneration, thoracic region: Secondary | ICD-10-CM | POA: Diagnosis not present

## 2015-07-01 ENCOUNTER — Encounter (HOSPITAL_COMMUNITY)
Admission: EM | Disposition: A | Discharge status: Home Health | Payer: Self-pay | Source: Home / Self Care | Attending: Cardiothoracic Surgery

## 2015-07-01 ENCOUNTER — Encounter (HOSPITAL_COMMUNITY): Payer: Self-pay | Admitting: Family Medicine

## 2015-07-01 ENCOUNTER — Inpatient Hospital Stay (HOSPITAL_COMMUNITY)
Admission: EM | Admit: 2015-07-01 | Discharge: 2015-07-11 | DRG: 234 | Disposition: A | Discharge status: Home Health | Payer: Medicare Other | Attending: Cardiothoracic Surgery | Admitting: Cardiothoracic Surgery

## 2015-07-01 ENCOUNTER — Inpatient Hospital Stay (HOSPITAL_COMMUNITY): Payer: Medicare Other

## 2015-07-01 ENCOUNTER — Emergency Department (HOSPITAL_COMMUNITY): Payer: Medicare Other

## 2015-07-01 DIAGNOSIS — I2510 Atherosclerotic heart disease of native coronary artery without angina pectoris: Secondary | ICD-10-CM | POA: Diagnosis not present

## 2015-07-01 DIAGNOSIS — R7303 Prediabetes: Secondary | ICD-10-CM | POA: Diagnosis present

## 2015-07-01 DIAGNOSIS — E8770 Fluid overload, unspecified: Secondary | ICD-10-CM | POA: Diagnosis not present

## 2015-07-01 DIAGNOSIS — Z8249 Family history of ischemic heart disease and other diseases of the circulatory system: Secondary | ICD-10-CM | POA: Diagnosis not present

## 2015-07-01 DIAGNOSIS — I10 Essential (primary) hypertension: Secondary | ICD-10-CM | POA: Diagnosis present

## 2015-07-01 DIAGNOSIS — K219 Gastro-esophageal reflux disease without esophagitis: Secondary | ICD-10-CM | POA: Diagnosis present

## 2015-07-01 DIAGNOSIS — E785 Hyperlipidemia, unspecified: Secondary | ICD-10-CM | POA: Diagnosis not present

## 2015-07-01 DIAGNOSIS — Z87891 Personal history of nicotine dependence: Secondary | ICD-10-CM

## 2015-07-01 DIAGNOSIS — T82855A Stenosis of coronary artery stent, initial encounter: Secondary | ICD-10-CM | POA: Diagnosis present

## 2015-07-01 DIAGNOSIS — I723 Aneurysm of iliac artery: Secondary | ICD-10-CM | POA: Diagnosis not present

## 2015-07-01 DIAGNOSIS — K5900 Constipation, unspecified: Secondary | ICD-10-CM | POA: Diagnosis not present

## 2015-07-01 DIAGNOSIS — I25110 Atherosclerotic heart disease of native coronary artery with unstable angina pectoris: Secondary | ICD-10-CM | POA: Diagnosis present

## 2015-07-01 DIAGNOSIS — I714 Abdominal aortic aneurysm, without rupture: Secondary | ICD-10-CM | POA: Diagnosis present

## 2015-07-01 DIAGNOSIS — I200 Unstable angina: Secondary | ICD-10-CM | POA: Diagnosis not present

## 2015-07-01 DIAGNOSIS — I214 Non-ST elevation (NSTEMI) myocardial infarction: Principal | ICD-10-CM | POA: Diagnosis present

## 2015-07-01 DIAGNOSIS — Y848 Other medical procedures as the cause of abnormal reaction of the patient, or of later complication, without mention of misadventure at the time of the procedure: Secondary | ICD-10-CM | POA: Diagnosis present

## 2015-07-01 DIAGNOSIS — Z79899 Other long term (current) drug therapy: Secondary | ICD-10-CM

## 2015-07-01 DIAGNOSIS — Z7982 Long term (current) use of aspirin: Secondary | ICD-10-CM

## 2015-07-01 DIAGNOSIS — E782 Mixed hyperlipidemia: Secondary | ICD-10-CM | POA: Diagnosis not present

## 2015-07-01 DIAGNOSIS — R0602 Shortness of breath: Secondary | ICD-10-CM | POA: Diagnosis not present

## 2015-07-01 DIAGNOSIS — Z4682 Encounter for fitting and adjustment of non-vascular catheter: Secondary | ICD-10-CM | POA: Diagnosis not present

## 2015-07-01 DIAGNOSIS — Z951 Presence of aortocoronary bypass graft: Secondary | ICD-10-CM

## 2015-07-01 DIAGNOSIS — R079 Chest pain, unspecified: Secondary | ICD-10-CM | POA: Diagnosis not present

## 2015-07-01 DIAGNOSIS — E1169 Type 2 diabetes mellitus with other specified complication: Secondary | ICD-10-CM | POA: Diagnosis present

## 2015-07-01 DIAGNOSIS — R918 Other nonspecific abnormal finding of lung field: Secondary | ICD-10-CM | POA: Diagnosis not present

## 2015-07-01 DIAGNOSIS — J9811 Atelectasis: Secondary | ICD-10-CM | POA: Diagnosis not present

## 2015-07-01 DIAGNOSIS — I080 Rheumatic disorders of both mitral and aortic valves: Secondary | ICD-10-CM | POA: Diagnosis not present

## 2015-07-01 DIAGNOSIS — J40 Bronchitis, not specified as acute or chronic: Secondary | ICD-10-CM | POA: Diagnosis not present

## 2015-07-01 HISTORY — PX: CARDIAC CATHETERIZATION: SHX172

## 2015-07-01 LAB — CBC
HCT: 40.8 % (ref 39.0–52.0)
Hemoglobin: 13 g/dL (ref 13.0–17.0)
MCH: 30.5 pg (ref 26.0–34.0)
MCHC: 31.9 g/dL (ref 30.0–36.0)
MCV: 95.8 fL (ref 78.0–100.0)
Platelets: 192 10*3/uL (ref 150–400)
RBC: 4.26 MIL/uL (ref 4.22–5.81)
RDW: 13.2 % (ref 11.5–15.5)
WBC: 7.2 10*3/uL (ref 4.0–10.5)

## 2015-07-01 LAB — BASIC METABOLIC PANEL
Anion gap: 10 (ref 5–15)
BUN: 20 mg/dL (ref 6–20)
CO2: 24 mmol/L (ref 22–32)
Calcium: 9.1 mg/dL (ref 8.9–10.3)
Chloride: 105 mmol/L (ref 101–111)
Creatinine, Ser: 1.23 mg/dL (ref 0.61–1.24)
GFR calc Af Amer: >60 mL/min (ref >60)
GFR calc non Af Amer: 54 mL/min — ABNORMAL LOW (ref >60)
Glucose, Bld: 137 mg/dL — ABNORMAL HIGH (ref 65–99)
Potassium: 4.3 mmol/L (ref 3.5–5.1)
Sodium: 139 mmol/L (ref 135–145)

## 2015-07-01 LAB — I-STAT TROPONIN, ED: Troponin i, poc: 0.05 ng/mL (ref 0.00–0.08)

## 2015-07-01 LAB — TROPONIN I
Troponin I: 0.04 ng/mL — ABNORMAL HIGH (ref <0.031)
Troponin I: 0.26 ng/mL — ABNORMAL HIGH (ref <0.031)

## 2015-07-01 LAB — PROTIME-INR
INR: 1.1 (ref 0.00–1.49)
Prothrombin Time: 14.4 seconds (ref 11.6–15.2)

## 2015-07-01 SURGERY — LEFT HEART CATH AND CORONARY ANGIOGRAPHY
Anesthesia: LOCAL

## 2015-07-01 MED ORDER — HEPARIN SODIUM (PORCINE) 1000 UNIT/ML IJ SOLN
INTRAMUSCULAR | Status: DC | PRN
  Administered 2015-07-01: 4500 [IU] via INTRAVENOUS

## 2015-07-01 MED ORDER — MIDAZOLAM HCL 2 MG/2ML IJ SOLN
INTRAMUSCULAR | Status: DC | PRN
Start: 2015-07-01 — End: 2015-07-01
  Administered 2015-07-01: 1 mg via INTRAVENOUS

## 2015-07-01 MED ORDER — SODIUM CHLORIDE 0.9 % IV SOLN
INTRAVENOUS | Status: AC
  Administered 2015-07-01: 19:00:00 via INTRAVENOUS

## 2015-07-01 MED ORDER — SODIUM CHLORIDE 0.9 % IV SOLN: 250.0000 mL | INTRAVENOUS | Status: DC | PRN

## 2015-07-01 MED ORDER — HEPARIN BOLUS VIA INFUSION
4000.0000 [IU] | Freq: Once | INTRAVENOUS | Status: AC
  Administered 2015-07-01: 4000 [IU] via INTRAVENOUS
  Filled 2015-07-01: qty 4000

## 2015-07-01 MED ORDER — FENTANYL CITRATE (PF) 100 MCG/2ML IJ SOLN
INTRAMUSCULAR | Status: AC
  Filled 2015-07-01: qty 2

## 2015-07-01 MED ORDER — ATENOLOL 25 MG PO TABS
25.0000 mg | ORAL_TABLET | Freq: Every day | ORAL | Status: DC
  Administered 2015-07-01 – 2015-07-05 (×5): 25 mg via ORAL
  Filled 2015-07-01 (×5): qty 1

## 2015-07-01 MED ORDER — SODIUM CHLORIDE 0.9% FLUSH
3.0000 mL | INTRAVENOUS | Status: DC | PRN
Start: 2015-07-01 — End: 2015-07-06

## 2015-07-01 MED ORDER — ROSUVASTATIN CALCIUM 10 MG PO TABS
10.0000 mg | ORAL_TABLET | Freq: Every day | ORAL | Status: DC
  Administered 2015-07-01 – 2015-07-02 (×2): 10 mg via ORAL
  Filled 2015-07-01 (×2): qty 1

## 2015-07-01 MED ORDER — MIDAZOLAM HCL 2 MG/2ML IJ SOLN
INTRAMUSCULAR | Status: AC
  Filled 2015-07-01: qty 2

## 2015-07-01 MED ORDER — CALCIUM CARBONATE ANTACID 500 MG PO CHEW
2.0000 | CHEWABLE_TABLET | Freq: Every day | ORAL | Status: DC
Start: 2015-07-01 — End: 2015-07-04
  Administered 2015-07-01: 400 mg via ORAL
  Filled 2015-07-01 (×4): qty 2

## 2015-07-01 MED ORDER — HEPARIN (PORCINE) IN NACL 100-0.45 UNIT/ML-% IJ SOLN
1150.0000 [IU]/h | INTRAMUSCULAR | Status: DC
  Administered 2015-07-02 – 2015-07-03 (×3): 1150 [IU]/h via INTRAVENOUS
  Filled 2015-07-01 (×5): qty 250

## 2015-07-01 MED ORDER — HEPARIN SODIUM (PORCINE) 1000 UNIT/ML IJ SOLN
INTRAMUSCULAR | Status: AC
  Filled 2015-07-01: qty 1

## 2015-07-01 MED ORDER — NITROGLYCERIN 0.4 MG SL SUBL: 0.4000 mg | SUBLINGUAL_TABLET | SUBLINGUAL | Status: DC | PRN

## 2015-07-01 MED ORDER — ATORVASTATIN CALCIUM 40 MG PO TABS: 40.0000 mg | ORAL_TABLET | Freq: Every day | ORAL | Status: DC

## 2015-07-01 MED ORDER — MAGNESIUM 200 MG PO TABS
200.0000 mg | ORAL_TABLET | Freq: Every day | ORAL | Status: DC
  Administered 2015-07-01 – 2015-07-03 (×3): 200 mg via ORAL
  Filled 2015-07-01 (×7): qty 1

## 2015-07-01 MED ORDER — SODIUM CHLORIDE 0.9 % IV SOLN
INTRAVENOUS | Status: DC
  Administered 2015-07-01: 16:00:00 via INTRAVENOUS

## 2015-07-01 MED ORDER — HEPARIN (PORCINE) IN NACL 100-0.45 UNIT/ML-% IJ SOLN
1100.0000 [IU]/h | INTRAMUSCULAR | Status: DC
  Administered 2015-07-01: 1100 [IU]/h via INTRAVENOUS
  Filled 2015-07-01: qty 250

## 2015-07-01 MED ORDER — FENTANYL CITRATE (PF) 100 MCG/2ML IJ SOLN
INTRAMUSCULAR | Status: DC | PRN
  Administered 2015-07-01: 25 ug via INTRAVENOUS

## 2015-07-01 MED ORDER — HEPARIN (PORCINE) IN NACL 2-0.9 UNIT/ML-% IJ SOLN
INTRAMUSCULAR | Status: DC | PRN
Start: 2015-07-01 — End: 2015-07-01
  Administered 2015-07-01: 18:00:00

## 2015-07-01 MED ORDER — ASPIRIN 325 MG PO TABS
325.0000 mg | ORAL_TABLET | Freq: Every day | ORAL | Status: DC
  Administered 2015-07-02 – 2015-07-05 (×4): 325 mg via ORAL
  Filled 2015-07-01 (×4): qty 1

## 2015-07-01 MED ORDER — MULTIVITAMINS PO TABS: 1.0000 | ORAL_TABLET | Freq: Every day | ORAL | Status: DC

## 2015-07-01 MED ORDER — SODIUM CHLORIDE 0.9% FLUSH: 3.0000 mL | Freq: Two times a day (BID) | INTRAVENOUS | Status: DC

## 2015-07-01 MED ORDER — HEPARIN (PORCINE) IN NACL 2-0.9 UNIT/ML-% IJ SOLN
INTRAMUSCULAR | Status: AC
  Filled 2015-07-01: qty 1000

## 2015-07-01 MED ORDER — ONDANSETRON HCL 4 MG/2ML IJ SOLN: 4.0000 mg | Freq: Four times a day (QID) | INTRAMUSCULAR | Status: DC | PRN

## 2015-07-01 MED ORDER — PANTOPRAZOLE SODIUM 40 MG PO TBEC
40.0000 mg | DELAYED_RELEASE_TABLET | Freq: Every day | ORAL | Status: DC
  Administered 2015-07-01 – 2015-07-05 (×5): 40 mg via ORAL
  Filled 2015-07-01 (×5): qty 1

## 2015-07-01 MED ORDER — VERAPAMIL HCL 2.5 MG/ML IV SOLN
INTRAVENOUS | Status: DC | PRN
  Administered 2015-07-01: 8 mL via INTRA_ARTERIAL

## 2015-07-01 MED ORDER — LIDOCAINE HCL (PF) 1 % IJ SOLN
INTRAMUSCULAR | Status: DC | PRN
Start: 2015-07-01 — End: 2015-07-01
  Administered 2015-07-01: 3 mL

## 2015-07-01 MED ORDER — SODIUM CHLORIDE 0.9% FLUSH
3.0000 mL | Freq: Two times a day (BID) | INTRAVENOUS | Status: DC
  Administered 2015-07-03 – 2015-07-05 (×4): 3 mL via INTRAVENOUS

## 2015-07-01 MED ORDER — FLAXSEED (LINSEED) 1000 MG PO CAPS
1000.0000 mg | ORAL_CAPSULE | Freq: Every day | ORAL | Status: DC
Start: 2015-07-01 — End: 2015-07-01

## 2015-07-01 MED ORDER — SODIUM CHLORIDE 0.9% FLUSH: 3.0000 mL | INTRAVENOUS | Status: DC | PRN

## 2015-07-01 MED ORDER — ACETAMINOPHEN 325 MG PO TABS: 650.0000 mg | ORAL_TABLET | ORAL | Status: DC | PRN

## 2015-07-01 MED ORDER — NITROGLYCERIN IN D5W 200-5 MCG/ML-% IV SOLN
0.0000 ug/min | INTRAVENOUS | Status: DC
  Administered 2015-07-06: 5 ug/min via INTRAVENOUS
  Filled 2015-07-01: qty 250

## 2015-07-01 MED ORDER — ASPIRIN 81 MG PO CHEW: 81.0000 mg | CHEWABLE_TABLET | ORAL | Status: DC

## 2015-07-01 MED ORDER — ADULT MULTIVITAMIN W/MINERALS CH
1.0000 | ORAL_TABLET | Freq: Every day | ORAL | Status: DC
  Administered 2015-07-01 – 2015-07-05 (×5): 1 via ORAL
  Filled 2015-07-01 (×5): qty 1

## 2015-07-01 MED ORDER — LIDOCAINE HCL (PF) 1 % IJ SOLN
INTRAMUSCULAR | Status: AC
  Filled 2015-07-01: qty 30

## 2015-07-01 MED ORDER — VERAPAMIL HCL 2.5 MG/ML IV SOLN
INTRAVENOUS | Status: AC
  Filled 2015-07-01: qty 2

## 2015-07-01 SURGICAL SUPPLY — 12 items
CATH INFINITI 5 FR JL3.5 (CATHETERS) ×1 IMPLANT
CATH INFINITI 5FR ANG PIGTAIL (CATHETERS) ×1 IMPLANT
CATH INFINITI JR4 5F (CATHETERS) ×1 IMPLANT
DEVICE RAD COMP TR BAND LRG (VASCULAR PRODUCTS) ×2 IMPLANT
GLIDESHEATH SLEND SS 6F .021 (SHEATH) ×1 IMPLANT
KIT HEART LEFT (KITS) ×2 IMPLANT
PACK CARDIAC CATHETERIZATION (CUSTOM PROCEDURE TRAY) ×2 IMPLANT
SYR MEDRAD MARK V 150ML (SYRINGE) ×2 IMPLANT
TRANSDUCER W/STOPCOCK (MISCELLANEOUS) ×2 IMPLANT
TUBING CIL FLEX 10 FLL-RA (TUBING) ×2 IMPLANT
WIRE HI TORQ VERSACORE-J 145CM (WIRE) ×1 IMPLANT
WIRE SAFE-T 1.5MM-J .035X260CM (WIRE) ×3 IMPLANT

## 2015-07-01 NOTE — H&P (Signed)
Patient ID: [name] MRN: [id number], DOB/AGE: [birth year]   Admit date: 07/01/2015   Primary Physician: Alesia Richards, MD Primary Cardiologist: Dr. Stanford Breed  Pt. Profile:  82 y/o male with known CAD s/p PCI to LAD and RCA in the past, h/o HTN, HLD and borderline DM, presenting to the ED with resting chest pain and mildly abnormal troponin.   Problem List  Past Medical History  Diagnosis Date  . Coronary atherosclerosis of unspecified type of vessel, native or graft   . 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 Personal history of other diseases of digestive system   . Other and unspecified hyperlipidemia   . Colon polyps   . Gastritis, chronic   . IBS (irritable bowel syndrome)   . Hypertension   . Diabetes mellitus     Diet control   . Vitamin D deficiency   . Vitamin B 12 deficiency   . Esophageal reflux     Past Surgical History  Procedure Laterality Date  . Knee surgery    . Bladder surgery    . Coronary angioplasty with stent placement       Allergies  Allergies  Allergen Reactions  . Cymbalta [Duloxetine Hcl]     Dizziness  . Keflex [Cephalexin] Other (See Comments)    Reaction: unknown   . Simvastatin Other (See Comments)    Reaction: unknown   . Sudafed [Pseudoephedrine]     Dizziness  . Prednisone Rash    HPI 82 y/o male, followed by Dr. Stanford Breed. He has not been seen in our clinic since 2015. He has a history of CAD, s/p LAD DES Oct 2009 and an RCA DES Dec 2011. He also has aortic disease which is followed yearly by AAA duplex. His most recent study was 05/2014. This showed stable dimensions of the infrarenal fusiform AAA measuring 3.1 cm x 2.6 cm. in following  Stable dimensions of the bilateral common iliac artery aneurysms measuring 1.8 cm x 2.0 cm on the right and 1.4 cm x 2.0 cm on the left. Additional PMH includes borderline diabetes, remote h/o tobacco use, HTN, PVCs, GERD and statin intolerance. He is followed medically by Dr.  Melford Aase.   He presents to the [facility] ED with a complaint of left-sided chest discomfort radiating to both arms. This occurred earlier this morning around 6 AM. He reports that he was in his usual state of health prior to going to sleep. He notes that the chest discomfort is somewhat different from his previous angina which was mostly neck discomfort and dyspnea. His recent chest discomfort is described as an achy discomfort. He has had slight radiation to his neck but the discomfort has mostly radiated to both arms. Symptoms occurred at rest. Not particular worse with exertion. Not pleuritic. No abdominal pain. He does have chronic LBP from bone spurs and scoliosis. He denies any significant dyspnea. No diaphoresis, nausea, vomiting, dizziness, syncope/near syncope. Maximum intensity was 9/10. Occurred off and on. He took Tums at home with no significant relief. He took 325 mg of aspirin at home which eased his discomfort but did not completely resolve it. His wife drove him to the ED. EKG shows sinus rhythm with less than 2 mm ST elevations in leads 3 and aVF. Initial lab troponin is mildly elevated at 0.04. CBC and BMP are both unremarkable. He is moderately hypertensive with systolic blood pressures in the mid 150s. His x-ray is unremarkable. He still has mild slight left-sided chest discomfort  occurring off and on.    Home Medications  Prior to Admission medications   Medication Sig Start Date End Date Taking? Authorizing Provider  aspirin 325 MG tablet Take 325 mg by mouth daily.     Yes Historical Provider, MD  atenolol (TENORMIN) 25 MG tablet Take 25 mg by mouth daily.   Yes Historical Provider, MD  calcium carbonate (TUMS - DOSED IN MG ELEMENTAL CALCIUM) 500 MG chewable tablet Chew 2 tablets by mouth daily.   Yes Historical Provider, MD  CALCIUM PO Take 1 tablet by mouth daily.   Yes Historical Provider, MD  Flaxseed, Linseed, 1000 MG CAPS Take 1,000 mg by mouth daily.    Yes Historical  Provider, MD  gabapentin (NEURONTIN) 300 MG capsule TAKE ONE CAPSULE BY MOUTH THREE TIMES DAILY FOR NEURITIS PAIN 01/20/15  Yes [name], MD  Magnesium 250 MG TABS Take 250 mg by mouth daily.   Yes Historical Provider, MD  multivitamin [facility]) per tablet Take 1 tablet by mouth daily.     Yes Historical Provider, MD  omeprazole (PRILOSEC) 20 MG capsule TAKE 2 CAPSULES BY MOUTH EVERY DAY FOR INDIGESTION Patient taking differently: TAKE 20 MG BY MOUTH ONCE DAILY AS NEEDED FOR INDIGESTION 10/11/14  Yes Vicie Mutters, PA-C  atenolol (TENORMIN) 50 MG tablet TAKE 1 TABLET BY MOUTH EVERY DAY Patient not taking: Reported on 07/01/2015 02/16/15   Courtney Forcucci, PA-C  glucose blood (ONE TOUCH ULTRA TEST) test strip Check glucose 1 time daily.  DX-R73.09 03/24/14   [name], MD  nitroGLYCERIN (NITROSTAT) 0.4 MG SL tablet DISSOLVE 1 TABLET UNDER TONGUE EVERY 5 MINUTES AS NEEDED UP TO 3 DOSES 07/24/14   [name], MD    Family History  Family History  Problem Relation Age of Onset  . Heart attack Father     died age 10  . Heart attack Brother     died age 33  . Heart attack Sister     died age 75  . Colon cancer Sister   . Liver cancer Sister   . Diabetes Maternal Grandmother   . Colon polyps Sister     and brothers x 2     Social History  Social History   Social History  . Marital Status: Married    Spouse Name: N/A  . Number of Children: 4   . Years of Education: N/A   Occupational History  . Retired    Social History Main Topics  . Smoking status: Former Smoker    Quit date: 07/16/1963  . Smokeless tobacco: Never Used  . Alcohol Use: No  . Drug Use: No  . Sexual Activity: Not on file   Other Topics Concern  . Not on file   Social History Narrative   1 caffeine drink daily      Review of Systems General:  No chills, fever, night sweats or weight changes.  Cardiovascular:  No chest pain, dyspnea on exertion, edema, orthopnea, palpitations,  paroxysmal nocturnal dyspnea. Dermatological: No rash, lesions/masses Respiratory: No cough, dyspnea Urologic: No hematuria, dysuria Abdominal:   No nausea, vomiting, diarrhea, bright red blood per rectum, melena, or hematemesis Neurologic:  No visual changes, wkns, changes in mental status. All other systems reviewed and are otherwise negative except as noted above.  Physical Exam  Blood pressure 142/76, pulse 60, temperature 97.6 F (36.4 C), temperature source Oral, resp. rate 17, height 6' 0.44" (1.84 m), weight 196 lb 3.4 oz (89 kg), SpO2 97 %.  General: Pleasant,  NAD Psych: Normal affect. Neuro: Alert and oriented X 3. Moves all extremities spontaneously. HEENT: Normal  Neck: Supple without bruits or JVD. Lungs:  Resp regular and unlabored, CTA. Heart: RRR no s3, s4, or murmurs. Abdomen: Soft, non-tender, non-distended, BS + x 4.  Extremities: No clubbing, cyanosis or edema. DP/PT/Radials 2+ and equal bilaterally.  Labs  Troponin [facility] Test)  Recent Labs  07/01/15 0812  TROPIPOC 0.05    Recent Labs  07/01/15 0805  TROPONINI 0.04*   Lab Results  Component Value Date   WBC 7.2 07/01/2015   HGB 13.0 07/01/2015   HCT 40.8 07/01/2015   MCV 95.8 07/01/2015   PLT 192 07/01/2015     Recent Labs Lab 07/01/15 0805  NA 139  K 4.3  CL 105  CO2 24  BUN 20  CREATININE 1.23  CALCIUM 9.1  GLUCOSE 137*   Lab Results  Component Value Date   CHOL 167 10/13/2014   HDL 29* 10/13/2014   LDLCALC 100* 10/13/2014   TRIG 188* 10/13/2014   No results found for: DDIMER   Radiology/Studies  Dg Chest 2 View  07/01/2015  CLINICAL DATA:  Severe chest pain beginning at 0600 hours, has eased off now, took aspirin, pain radiated to both arms, history hypertension, diabetes mellitus, coronary artery disease post stenting, former smoker, GERD EXAM: CHEST  2 VIEW COMPARISON:  01/07/2013 FINDINGS: Upper normal heart size with note of coronary stents. Tortuous aorta.  Mediastinal contours and pulmonary vascularity normal. Bronchitic changes without infiltrate, pleural effusion or pneumothorax. Endplate spur formation at inferior thoracic spine. IMPRESSION: Bronchitic changes without acute infiltrate. Electronically Signed   By: Lavonia Dana M.D.   On: 07/01/2015 08:21    ECG  NSR with < 2 mm ST elevations in leads III and AVF    ASSESSMENT AND PLAN  Principal Problem:   Unstable angina [facility]) Active Problems:   Coronary atherosclerosis- s/p PCI to LAD in 2009 and PCI to RCA in 2011   Hypertension   Hyperlipidemia   Prediabetes  1. Chest Pain/CAD: patient with known h/o CAD s/p PCI to LAD in 2009 and PCI to RCA in 2011. Also with other risk factors including HLD intolerant of statins, HTN and borderline DM, here with resting chest discomfort concerning for unstable angina. Initial lab troponin is mildly elevated at 0.04. He continues to have mild chest discomfort, although significantly improved since earlier this morning. His initial EKG ~ 8AM showed less than 2 mm ST elevations in leads III and AVF. F/u 12 lead EKG shows NSR w/o elevation. Given his history of known LAD and RCA disease, risk factors, symptoms and initial troponin, recommend admission for further w/u. Given he continues to have mild CP, we will start IV heparin per pharmacy + IV nitro also for for BP control. Will continue to cycle cardiac enzymes x 3. Keep NPO for now, until we know for sure if he will go for potential cath today. MD to follow with further recommendations.   2. HLD: h/o statin intolerance. Most recent lipid panel 10/2014 showed LDL at 100 mg/dL. We will recheck a FLP in the am. He was on simvastatin in the past. Will re-try another statin. If unable to tolerate, can consider PSK9 inhibitor to get LDL below goal of < 70.   3. HTN: moderately elevated in ED in the mid 150s. Given CP, will treat with IV nitro. Continue home atenolol. Continue to monitor.   4. Borderline DM:  followed by PCP.  Last Hgb A1c was 5.9.   5. PVD: pt has distal aortic disease which is followed yearly by AAA duplex. His most recent study was 05/2014. This showed stable dimensions of the infrarenal fusiform AAA measuring 3.1 cm x 2.6 cm. in following  Stable dimensions of the bilateral common iliac artery aneurysms measuring 1.8 cm x 2.0 cm on the right and 1.4 cm x 2.0 cm on the left. This study was reviewed by Dr. Stanford Breed. He recommended repeating in 1 year.    Signed, Lyda Jester, PA-C 07/01/2015, 11:23 AM   History and all data above reviewed.  Patient examined.  I agree with the findings as above.   Chest pain consistent with unstable angina.  Started abruptly today.  Subsided on its own.  Subtle inferior ST elevation and borderline troponin.    The patient exam reveals COR:RRR  ,  Lungs: Clear  ,  Abd: Positive bowel sounds, no rebound no guarding, Ext No edema  .  All available labs, radiology testing, previous records reviewed. Agree with documented assessment and plan. Unstable angina:  Cath today.  The patient understands that risks included but are not limited to stroke (1 in 1000), death (1 in 19), kidney failure [usually temporary] (1 in 500), bleeding (1 in 200), allergic reaction [possibly serious] (1 in 200).  The patient understands and agrees to proceed.   HTN:  He reports that his BP is typically well controlled at home.  No change in meds at this time.    Jeneen Rinks Geniya Fulgham  1:36 PM  07/01/2015

## 2015-07-01 NOTE — ED Provider Notes (Signed)
CSN: [id number]     Arrival date & time 07/01/15  0751 History   First MD Initiated Contact with Patient 07/01/15 [phone]     Chief Complaint  Patient presents with  . Chest Pain     (Consider location/radiation/quality/duration/timing/severity/associated sxs/prior Treatment) HPI   Patient is a very pleasant 82 year old male presenting with chest pain. Patient has history of 2 stents. Most recently in 2009 a DES stent placed at 95% LAD lesion by Dr. Lia Foyer. Patient saw Dr. Stanford Breed for a number of years after this. Has not seen him in the last couple years because he is not needed to. Patient reports no history of hypertension. Most recent risk stratification/stress test was over 4 years ago.  Patient woke up this morning at 6 AM with chest pain radiating to both shoulders and both jaws. Not associated with diaphoresis or shortness of breath.  Patient initially thought it was reflux because he had some fried chicken yesterday. He took aspirin and Tums prior to arrival. His chest pain is now improved.  Past Medical History  Diagnosis Date  . Coronary atherosclerosis of unspecified type of vessel, native or graft   . 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 Personal history of other diseases of digestive system   . Other and unspecified hyperlipidemia   . Colon polyps   . Gastritis, chronic   . IBS (irritable bowel syndrome)   . Hypertension   . Diabetes mellitus     Diet control   . Vitamin D deficiency   . Vitamin B 12 deficiency   . Esophageal reflux    Past Surgical History  Procedure Laterality Date  . Knee surgery    . Bladder surgery    . Coronary angioplasty with stent placement     Family History  Problem Relation Age of Onset  . Heart attack Father     died age 75  . Heart attack Brother     died age 66  . Heart attack Sister     died age 81  . Colon cancer Sister   . Liver cancer Sister   . Diabetes Maternal Grandmother   . Colon polyps Sister     and brothers x 2     Social History  Substance Use Topics  . Smoking status: Former Smoker    Quit date: 07/16/1963  . Smokeless tobacco: Never Used  . Alcohol Use: No    Review of Systems  Constitutional: Negative for fever and activity change.  Eyes: Negative for discharge.  Respiratory: Negative for cough and shortness of breath.   Cardiovascular: Positive for chest pain.  Gastrointestinal: Negative for abdominal pain.  Genitourinary: Negative for dysuria and urgency.  Musculoskeletal: Negative for arthralgias.  Allergic/Immunologic: Negative for immunocompromised state.  Neurological: Negative for seizures and speech difficulty.  Psychiatric/Behavioral: Negative for agitation.  All other systems reviewed and are negative.     Allergies  Cymbalta; Keflex; Simvastatin; Sudafed; and Prednisone  Home Medications   Prior to Admission medications   Medication Sig Start Date End Date Taking? Authorizing Provider  aspirin 325 MG tablet Take 325 mg by mouth daily.     Yes Historical Provider, MD  atenolol (TENORMIN) 25 MG tablet Take 25 mg by mouth daily.   Yes Historical Provider, MD  calcium carbonate (TUMS - DOSED IN MG ELEMENTAL CALCIUM) 500 MG chewable tablet Chew 2 tablets by mouth daily.   Yes Historical Provider, MD  CALCIUM PO Take 1 tablet by mouth daily.  Yes Historical Provider, MD  Flaxseed, Linseed, 1000 MG CAPS Take 1,000 mg by mouth daily.    Yes Historical Provider, MD  gabapentin (NEURONTIN) 300 MG capsule TAKE ONE CAPSULE BY MOUTH THREE TIMES DAILY FOR NEURITIS PAIN 01/20/15  Yes [name], MD  Magnesium 250 MG TABS Take 250 mg by mouth daily.   Yes Historical Provider, MD  multivitamin [facility]) per tablet Take 1 tablet by mouth daily.     Yes Historical Provider, MD  omeprazole (PRILOSEC) 20 MG capsule TAKE 2 CAPSULES BY MOUTH EVERY DAY FOR INDIGESTION Patient taking differently: TAKE 20 MG BY MOUTH ONCE DAILY AS NEEDED FOR INDIGESTION 10/11/14  Yes Vicie Mutters, PA-C   atenolol (TENORMIN) 50 MG tablet TAKE 1 TABLET BY MOUTH EVERY DAY Patient not taking: Reported on 07/01/2015 02/16/15   Courtney Forcucci, PA-C  glucose blood (ONE TOUCH ULTRA TEST) test strip Check glucose 1 time daily.  DX-R73.09 03/24/14   [name], MD  nitroGLYCERIN (NITROSTAT) 0.4 MG SL tablet DISSOLVE 1 TABLET UNDER TONGUE EVERY 5 MINUTES AS NEEDED UP TO 3 DOSES 07/24/14   [name], MD   BP 163/73 mmHg  Pulse 60  Temp(Src) 97.8 F (36.6 C) (Oral)  Resp 16  Ht 6' (1.829 m)  Wt 190 lb 14.4 oz (86.592 kg)  BMI 25.89 kg/m2  SpO2 99% Physical Exam  Constitutional: He is oriented to person, place, and time. He appears well-nourished.  HENT:  Head: Normocephalic.  Mouth/Throat: Oropharynx is clear and moist.  Eyes: Conjunctivae are normal.  Neck: No tracheal deviation present.  Cardiovascular: Normal rate.   Pulmonary/Chest: Effort normal. No stridor. No respiratory distress.  Abdominal: Soft. There is no tenderness. There is no guarding.  Musculoskeletal: Normal range of motion. He exhibits no edema.  Neurological: He is oriented to person, place, and time. No cranial nerve deficit.  Skin: Skin is warm and dry. No rash noted. He is not diaphoretic.  Psychiatric: He has a normal mood and affect. His behavior is normal.  Nursing note and vitals reviewed.   ED Course  Procedures (including critical care time) Labs Review Labs Reviewed  BASIC METABOLIC PANEL - Abnormal; Notable for the following:    Glucose, Bld 137 (*)    GFR calc non Af Amer 54 (*)    All other components within normal limits  TROPONIN I - Abnormal; Notable for the following:    Troponin I 0.04 (*)    All other components within normal limits  TROPONIN I - Abnormal; Notable for the following:    Troponin I 0.26 (*)    All other components within normal limits  CBC  PROTIME-INR  HEPARIN LEVEL (UNFRACTIONATED)  I-STAT TROPOININ, ED  I-STAT TROPOININ, ED    Imaging Review Dg Chest 2  View  07/01/2015  CLINICAL DATA:  Severe chest pain beginning at 0600 hours, has eased off now, took aspirin, pain radiated to both arms, history hypertension, diabetes mellitus, coronary artery disease post stenting, former smoker, GERD EXAM: CHEST  2 VIEW COMPARISON:  01/07/2013 FINDINGS: Upper normal heart size with note of coronary stents. Tortuous aorta. Mediastinal contours and pulmonary vascularity normal. Bronchitic changes without infiltrate, pleural effusion or pneumothorax. Endplate spur formation at inferior thoracic spine. IMPRESSION: Bronchitic changes without acute infiltrate. Electronically Signed   By: Lavonia Dana M.D.   On: 07/01/2015 08:21   I have personally reviewed and evaluated these images and lab results as part of my medical decision-making.   EKG Interpretation   Date/Time:  Wednesday July 01 2015 07:56:55 EST Ventricular Rate:  59 PR Interval:  220 QRS Duration: 116 QT Interval:  423 QTC Calculation: 419 R Axis:   54 Text Interpretation:  Sinus rhythm Prolonged PR interval Nonspecific  intraventricular conduction delay Minimal ST elevation, anterior leads  Baseline wander in lead(s) V3 V4 St elevation < 2 mm in 3 and avf Normal  sinus rhythm Confirmed by Gerald Leitz ([id number]) on 07/01/2015 8:01:10 AM      MDM   Final diagnoses:  Chest pain, unspecified chest pain type    Patient is an 82 year old male with history of 2 stents placed, drug-eluting stent to LAD in 2009. Patient's presenting today with chest pain radiating to bilateral shoulders and jaw. Patient took aspirin and pain is now resolved. Patient has high heart score given symptomatology and age and risk factors.  We'll get initial troponin, chest x-ray and then consult cardiology. I suspect he may require admission for risk stratification.   9:41 AM Discussed with caridology, they will come see. Pt still CP free.    Cards accepted admission.    Danelia Snodgrass Julio Alm, MD 07/01/15 1542

## 2015-07-01 NOTE — Interval H&P Note (Signed)
History and Physical Interval Note:  07/01/2015 5:44 PM  [name]  has presented today for surgery, with the diagnosis of Unstable Angina  The various methods of treatment have been discussed with the patient and family. After consideration of risks, benefits and other options for treatment, the patient has consented to  Procedure(s): Left Heart Cath and Coronary Angiography (N/A) as a surgical intervention .  The patient's history has been reviewed, patient examined, no change in status, stable for surgery.  I have reviewed the patient's chart and labs.  Questions were answered to the patient's satisfaction.     Kathlyn [place]

## 2015-07-01 NOTE — ED Notes (Signed)
Cardiology at bedside.

## 2015-07-01 NOTE — ED Notes (Signed)
Patient transported to X-ray 

## 2015-07-01 NOTE — Progress Notes (Signed)
[place] for heparin Indication: chest pain/ACS  Allergies  Allergen Reactions  . Cymbalta [Duloxetine Hcl]     Dizziness  . Keflex [Cephalexin] Other (See Comments)    Reaction: unknown   . Simvastatin Other (See Comments)    Reaction: unknown   . Sudafed [Pseudoephedrine]     Dizziness  . Prednisone Rash    Patient Measurements: Height: 6' (182.9 cm) Weight: 190 lb 14.4 oz (86.592 kg) IBW/kg (Calculated) : 77.6 Heparin Dosing Weight: 89kg  Vital Signs: Temp: 98.5 F (36.9 C) (03/01 2125) Temp Source: Oral (03/01 2125) BP: 147/75 mmHg (03/01 1850) Pulse Rate: 60 (03/01 1850)  Labs:  Recent Labs  07/01/15 0805 07/01/15 1257 07/01/15 1455  HGB 13.0  --   --   HCT 40.8  --   --   PLT 192  --   --   LABPROT  --   --  14.4  INR  --   --  1.10  CREATININE 1.23  --   --   TROPONINI 0.04* 0.26*  --     Estimated Creatinine Clearance: 52.6 mL/min (by C-G formula based on Cr of 1.23).   Medical History: Past Medical History  Diagnosis Date  . Coronary atherosclerosis of unspecified type of vessel, native or graft   . 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 Personal history of other diseases of digestive system   . Other and unspecified hyperlipidemia   . Colon polyps   . Gastritis, chronic   . IBS (irritable bowel syndrome)   . Hypertension   . Diabetes mellitus     Diet control   . Vitamin D deficiency   . Vitamin B 12 deficiency   . Esophageal reflux     Medications:  Infusions:  . sodium chloride Stopped (07/01/15 2256)  . [START ON 07/02/2015] heparin    . nitroGLYCERIN Stopped (07/01/15 1119)    Assessment: 62 yom presented to the ED with CP. Troponin mildly elevated. To start IV heparin. Baseline CBC is WNL and he is not on anticoagulation PTA.  Goal of Therapy:  Heparin level 0.3-0.7 units/ml Monitor platelets by anticoagulation protocol: Yes   Plan:  - Restart Heparin gtt 1150 units/hr 6 hours post TR  band removal - Check an 8 hour heparin level - Daily heparin level and CBC  Thank you for allowing [place] to participate in this patients care. Jens Som, PharmD Pager: [phone] 07/01/2015,11:03 PM

## 2015-07-01 NOTE — Progress Notes (Signed)
ANTICOAGULATION CONSULT NOTE - Initial Consult  Pharmacy Consult for heparin Indication: chest pain/ACS  Allergies  Allergen Reactions  . Cymbalta [Duloxetine Hcl]     Dizziness  . Keflex [Cephalexin] Other (See Comments)    Reaction: unknown   . Simvastatin Other (See Comments)    Reaction: unknown   . Sudafed [Pseudoephedrine]     Dizziness  . Prednisone Rash    Patient Measurements: Height: 6' 0.44" (184 cm) Weight: 196 lb 3.4 oz (89 kg) IBW/kg (Calculated) : 78.61 Heparin Dosing Weight: 89kg  Vital Signs: Temp: 97.6 F (36.4 C) (03/01 0801) Temp Source: Oral (03/01 0801) BP: 142/76 mmHg (03/01 1115) Pulse Rate: 60 (03/01 1115)  Labs:  Recent Labs  07/01/15 0805  HGB 13.0  HCT 40.8  PLT 192  CREATININE 1.23  TROPONINI 0.04*    Estimated Creatinine Clearance: 53.3 mL/min (by C-G formula based on Cr of 1.23).   Medical History: Past Medical History  Diagnosis Date  . Coronary atherosclerosis of unspecified type of vessel, native or graft   . 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 Personal history of other diseases of digestive system   . Other and unspecified hyperlipidemia   . Colon polyps   . Gastritis, chronic   . IBS (irritable bowel syndrome)   . Hypertension   . Diabetes mellitus     Diet control   . Vitamin D deficiency   . Vitamin B 12 deficiency   . Esophageal reflux     Medications:  Infusions:  . heparin    . nitroGLYCERIN Stopped (07/01/15 1119)    Assessment: 36 yom presented to the ED with CP. Troponin mildly elevated. To start IV heparin. Baseline CBC is WNL and he is not on anticoagulation PTA.   Goal of Therapy:  Heparin level 0.3-0.7 units/ml Monitor platelets by anticoagulation protocol: Yes   Plan:  - Heparin bolus 4000 units IV x 1 - Heparin gtt 1100 units/hr - Check an 8 hour heparin level - Daily heparin level and CBC  [name], [name] 07/01/2015,11:25 AM

## 2015-07-01 NOTE — ED Notes (Signed)
Pt here for chest pain that started about 6 am that was severe. sts has eased off now. sts took ASA. sts the pain radiated down both arms.

## 2015-07-01 NOTE — Progress Notes (Signed)
  Echocardiogram 2D Echocardiogram has been performed.  Donata Clay 07/01/2015, 5:01 PM

## 2015-07-01 NOTE — ED Notes (Signed)
Brittainy simmons, PA with Cardiology made aware that patient is denying chest pain at this time, PA Advised nitro drip could be held for now.

## 2015-07-02 ENCOUNTER — Encounter (HOSPITAL_COMMUNITY): Payer: Self-pay | Admitting: Cardiothoracic Surgery

## 2015-07-02 ENCOUNTER — Ambulatory Visit: Payer: Self-pay | Admitting: Internal Medicine

## 2015-07-02 ENCOUNTER — Inpatient Hospital Stay (HOSPITAL_COMMUNITY): Payer: Medicare Other

## 2015-07-02 ENCOUNTER — Other Ambulatory Visit: Payer: Self-pay | Admitting: *Deleted

## 2015-07-02 DIAGNOSIS — I2510 Atherosclerotic heart disease of native coronary artery without angina pectoris: Secondary | ICD-10-CM

## 2015-07-02 LAB — BASIC METABOLIC PANEL
Anion gap: 8 (ref 5–15)
BUN: 16 mg/dL (ref 6–20)
CO2: 27 mmol/L (ref 22–32)
Calcium: 8.9 mg/dL (ref 8.9–10.3)
Chloride: 105 mmol/L (ref 101–111)
Creatinine, Ser: 1.17 mg/dL (ref 0.61–1.24)
GFR calc Af Amer: >60 mL/min (ref >60)
GFR calc non Af Amer: 57 mL/min — ABNORMAL LOW (ref >60)
Glucose, Bld: 118 mg/dL — ABNORMAL HIGH (ref 65–99)
Potassium: 4.1 mmol/L (ref 3.5–5.1)
Sodium: 140 mmol/L (ref 135–145)

## 2015-07-02 LAB — CBC
HCT: 37.1 % — ABNORMAL LOW (ref 39.0–52.0)
Hemoglobin: 11.9 g/dL — ABNORMAL LOW (ref 13.0–17.0)
MCH: 30.8 pg (ref 26.0–34.0)
MCHC: 32.1 g/dL (ref 30.0–36.0)
MCV: 96.1 fL (ref 78.0–100.0)
Platelets: 189 10*3/uL (ref 150–400)
RBC: 3.86 MIL/uL — ABNORMAL LOW (ref 4.22–5.81)
RDW: 13.2 % (ref 11.5–15.5)
WBC: 6.6 10*3/uL (ref 4.0–10.5)

## 2015-07-02 LAB — LIPID PANEL
Cholesterol: 156 mg/dL (ref 0–200)
HDL: 25 mg/dL — ABNORMAL LOW (ref >40)
LDL Cholesterol: 103 mg/dL — ABNORMAL HIGH (ref 0–99)
Total CHOL/HDL Ratio: 6.2 RATIO
Triglycerides: 140 mg/dL (ref <150)
VLDL: 28 mg/dL (ref 0–40)

## 2015-07-02 LAB — HEPARIN LEVEL (UNFRACTIONATED)
Heparin Unfractionated: 0.55 IU/mL (ref 0.30–0.70)
Heparin Unfractionated: 0.5 IU/mL (ref 0.30–0.70)

## 2015-07-02 NOTE — Progress Notes (Signed)
Pre-op Cardiac Surgery  Carotid Findings:  There is no obvious evidence of hemodynamically significant internal carotid artery stenosis bilaterally. Vertebral arteries are patent with antegrade flow.  Upper Extremity Right Left  Brachial Pressures 141-Triphasic 137-Triphasic  Radial Waveforms Triphasic Triphasic  Ulnar Waveforms Triphasic Triphasic  Palmar Arch (Allen's Test) Signal obliterates with both radial and ulnar compression. Within normal limits.   Lower  Extremity Right Left  Dorsalis Pedis 147-Triphasic 158-Triphasic  Anterior Tibial    Posterior Tibial 97-Monophasic 175-Dampened monophasic  Ankle/Brachial Indices 1.04 1.24    Findings:   Bilateral ABIs are within normal limits.  07/02/2015 1:31 PM [name], RVT, RDCS, RDMS

## 2015-07-02 NOTE — Progress Notes (Signed)
[place] for heparin Indication: chest pain/ACS  Allergies  Allergen Reactions  . Cymbalta [Duloxetine Hcl]     Dizziness  . Keflex [Cephalexin] Other (See Comments)    Reaction: unknown   . Simvastatin Other (See Comments)    Reaction: unknown   . Sudafed [Pseudoephedrine]     Dizziness  . Prednisone Rash    Patient Measurements: Height: 6' (182.9 cm) Weight: 192 lb 6.4 oz (87.272 kg) IBW/kg (Calculated) : 77.6 Heparin Dosing Weight: 89kg  Vital Signs: Temp: 98.6 F (37 C) (03/02 0507) Temp Source: Oral (03/02 0507) BP: 90/56 mmHg (03/02 0507) Pulse Rate: 72 (03/01 2221)  Labs:  Recent Labs  07/01/15 0805 07/01/15 1257 07/01/15 1455 07/02/15 0530  HGB 13.0  --   --  11.9*  HCT 40.8  --   --  37.1*  PLT 192  --   --  189  LABPROT  --   --  14.4  --   INR  --   --  1.10  --   CREATININE 1.23  --   --  1.17  TROPONINI 0.04* 0.26*  --   --     Estimated Creatinine Clearance: 55.3 mL/min (by C-G formula based on Cr of 1.17).   Medical History: Past Medical History  Diagnosis Date  . Coronary atherosclerosis of unspecified type of vessel, native or graft   . 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 Personal history of other diseases of digestive system   . Other and unspecified hyperlipidemia   . Colon polyps   . Gastritis, chronic   . IBS (irritable bowel syndrome)   . Hypertension   . Diabetes mellitus     Diet control   . Vitamin D deficiency   . Vitamin B 12 deficiency   . Esophageal reflux     Medications:  Infusions:  . heparin 1,150 Units/hr (07/02/15 0502)  . nitroGLYCERIN Stopped (07/01/15 1119)    Assessment: 34 yom presented to the ED with CP. Troponin mildly elevated. Not on anticoagulation PTA. To continue on IV heparin post-cath on 3/1. Found significant 3V CAD - TCTS to eval for CABG. CBC WNL. Hg 11.9, plt wnl.  HL therapeutic x1 (0.5) on 1150 units/h. No bleed/IV line issues reported.  Goal of  Therapy:  Heparin level 0.3-0.7 units/ml Monitor platelets by anticoagulation protocol: Yes   Plan:  Heparin at 1150 units/h  8h HL to confirm Daily HL/CBC  Mon s/sx bleeding F/u plans for possible CABG  Elicia Lamp, PharmD, [facility] Clinical Pharmacist Pager [phone] 07/02/2015 10:13 AM

## 2015-07-02 NOTE — Progress Notes (Signed)
Primary Cardiologist: Dr. Stanford Breed  Patient Profile: 82 y/o male with known CAD s/p PCI to LAD and RCA in the past, h/o HTN, HLD and borderline DM, presenting to the ED with resting chest pain and abnormal troponin c/w NSTEMI. LHC 07/01/15 revealed significant 3VD. Awaiting consultation for potential CABG.    Subjective: No complaints this am. Sitting up eating breakfast. Family by bedside. He denies any recurrent CP. No dyspnea.   Objective: Vital signs in last 24 hours: Temp:  [97.6 F (36.4 C)-98.6 F (37 C)] 98.6 F (37 C) (03/02 0507) Pulse Rate:  [0-79] 72 (03/01 2221) Resp:  [7-62] 13 (03/01 2221) BP: (90-168)/(56-88) 90/56 mmHg (03/02 0507) SpO2:  [0 %-100 %] 96 % (03/02 0507) Weight:  [190 lb 14.4 oz (86.592 kg)-196 lb 3.4 oz (89 kg)] 192 lb 6.4 oz (87.272 kg) (03/02 0513) Last BM Date: 06/30/15  Intake/Output from previous day: 03/01 0701 - 03/02 0700 In: 353.7 [I.V.:353.7] Out: 275 [Urine:275] Intake/Output this shift:    Medications Current Facility-Administered Medications  Medication Dose Route Frequency Provider Last Rate Last Dose  . 0.9 %  sodium chloride infusion  250 mL Intravenous PRN Wellington Hampshire, MD      . acetaminophen (TYLENOL) tablet 650 mg  650 mg Oral Q4H PRN Wellington Hampshire, MD      . aspirin tablet 325 mg  325 mg Oral Daily Wellington Hampshire, MD      . atenolol (TENORMIN) tablet 25 mg  25 mg Oral Daily Wellington Hampshire, MD   25 mg at 07/01/15 2215  . calcium carbonate (TUMS - dosed in mg elemental calcium) chewable tablet 400 mg of elemental calcium  2 tablet Oral Daily Wellington Hampshire, MD   400 mg of elemental calcium at 07/01/15 2214  . heparin ADULT infusion 100 units/mL (25000 units/250 mL)  1,150 Units/hr Intravenous Continuous Jens Som, RPH 11.5 mL/hr at 07/02/15 0502 1,150 Units/hr at 07/02/15 0502  . Magnesium TABS 200 mg  200 mg Oral Daily Wellington Hampshire, MD   200 mg at 07/01/15 2215  . multivitamin with minerals tablet 1  tablet  1 tablet Oral Daily Lelon Perla, MD   1 tablet at 07/01/15 2216  . nitroGLYCERIN (NITROSTAT) SL tablet 0.4 mg  0.4 mg Sublingual Q5 min PRN Wellington Hampshire, MD      . nitroGLYCERIN 50 mg in dextrose 5 % 250 mL (0.2 mg/mL) infusion  0-200 mcg/min Intravenous Titrated Brittainy Erie Noe, PA-C   Stopped at 07/01/15 1119  . ondansetron (ZOFRAN) injection 4 mg  4 mg Intravenous Q6H PRN Wellington Hampshire, MD      . pantoprazole (PROTONIX) EC tablet 40 mg  40 mg Oral Daily Wellington Hampshire, MD   40 mg at 07/01/15 2216  . rosuvastatin (CRESTOR) tablet 10 mg  10 mg Oral q1800 Wellington Hampshire, MD   10 mg at 07/01/15 2215  . sodium chloride flush (NS) 0.9 % injection 3 mL  3 mL Intravenous Q12H Wellington Hampshire, MD   3 mL at 07/01/15 2219  . sodium chloride flush (NS) 0.9 % injection 3 mL  3 mL Intravenous PRN Wellington Hampshire, MD        PE: General appearance: alert, cooperative and no distress Neck: no carotid bruit and no JVD Lungs: clear to auscultation bilaterally Heart: regular rate and rhythm, S1, S2 normal, no murmur, click, rub or gallop Extremities: no LEE Pulses: 2+ and symmetric Skin:  warm and dry Neurologic: Grossly normal  Lab Results:   Recent Labs  07/01/15 0805 07/02/15 0530  WBC 7.2 6.6  HGB 13.0 11.9*  HCT 40.8 37.1*  PLT 192 189   BMET  Recent Labs  07/01/15 0805 07/02/15 0530  NA 139 140  K 4.3 4.1  CL 105 105  CO2 24 27  GLUCOSE 137* 118*  BUN 20 16  CREATININE 1.23 1.17  CALCIUM 9.1 8.9   PT/INR  Recent Labs  07/01/15 1455  LABPROT 14.4  INR 1.10   Lipid Panel     Component Value Date/Time   CHOL 156 07/02/2015 0530   TRIG 140 07/02/2015 0530   HDL 25* 07/02/2015 0530   CHOLHDL 6.2 07/02/2015 0530   VLDL 28 07/02/2015 0530   LDLCALC 103* 07/02/2015 0530    Cardiac Panel (last 3 results)  Recent Labs  07/01/15 0805 07/01/15 1257  TROPONINI 0.04* 0.26*    Studies/Results: LHC 07/02/14 Procedures    Left Heart Cath  and Coronary Angiography    Conclusion     Prox RCA to Mid RCA lesion, 60% stenosed. The lesion was previously treated with a stent (unknown type).  Ost LAD lesion, 70% stenosed.  Ost Cx to Prox Cx lesion, 60% stenosed.  Prox LAD to Mid LAD lesion, 10% stenosed. The lesion was previously treated with a stent (unknown type) greater than two years ago.  Mid LAD lesion, 99% stenosed.  Ost 2nd Diag to 2nd Diag lesion, 70% stenosed.  The left ventricular systolic function is normal.  1. Significant three-vessel coronary artery disease. The culprit for subtotally occluded Mid LAD which is at the bifurcation of second diagonal which has significant ostial stenosis. There is also ostial LAD stenosis. The proximal LAD stent is patent. There are faint right-to-left collaterals to septal branches. There is borderline significant disease in the proximal left circumflex as well as borderline significant in-stent restenosis in the right coronary artery.  2. Normal LV systolic function mildly elevated left ventricular end-diastolic pressure.    2D echo 07/01/15 Study Conclusions  - Left ventricle: The cavity size was normal. There was mild concentric hypertrophy. Systolic function was normal. The estimated ejection fraction was in the range of 60% to 65%. Wall motion was normal; there were no regional wall motion abnormalities. Doppler parameters are consistent with abnormal left ventricular relaxation (grade 1 diastolic dysfunction). - Aortic valve: Transvalvular velocity was within the normal range. There was no stenosis. There was no regurgitation. - Mitral valve: Transvalvular velocity was within the normal range. There was no evidence for stenosis. There was no regurgitation. - Left atrium: The atrium was mildly dilated. - Right ventricle: The cavity size was normal. Wall thickness was normal. Systolic function was normal. - Atrial septum: No defect or patent  foramen ovale was identified by color flow Doppler. - Tricuspid valve: There was mild regurgitation. - Inferior vena cava: The vessel was normal in size.  Assessment/Plan  Principal Problem:   Unstable angina [facility]) Active Problems:   Coronary atherosclerosis- s/p PCI to LAD in 2009 and PCI to RCA in 2011   Hypertension   Hyperlipidemia   Prediabetes   NSTEMI (non-ST elevated myocardial infarction) ([place])   1. NSTEMI/CAD: Troponin peaked at 0.26. LHC demonstrated significant 3VD. The culprit lesion for his NSTEMI is felt to be a subtotally occluded mid LAD which is at the bifurcation of second diagonal which has significant ostial stenosis. There is also ostial LAD stenosis. The proximal LAD stent is patent.  There are faint right-to-left collaterals to septal branches. There is borderline significant disease in the proximal left circumflex as well as borderline significant in-stent restenosis in the right coronary artery. The LAD is not favorable for PCI given that it is a bifurcation lesion and also there is ostial LAD stenosis, thus surgical consultation has been requested for potential CABG.  PCI can be considered if he is deemed to be too high risk for CABG. LV function is well persevered. EF 60-65% by echo. He is currently CP free on medical therapy. Continue medical therapy for now with IV heparin, ASA, statin and BB. No room for ACE given soft BP.   2. S/p Cath: renal function is stable with SCr at 1.17. VSS. Right radial cath site is stable w/o complication.   3. HLD:  FLP shows LDL at 103 mg/dL. H/o statin intolerance. He was not on medical therapy prior to admit. He was on simvastatin in the past. Will re-try another statin. He is currently on 10 mg of Crestor. If he did not tolerate this.    4. HTN: stable, but a bit soft this am at 90 systolic. No room for ACE-I. Hold BB if SBP is <90. Continue to monitor.   5. Borderline DM: followed by PCP. Last Hgb A1c was 5.9.   6. PVD:  pt has distal aortic disease which is followed yearly by AAA duplex. His most recent study was 05/2014. This showed stable dimensions of the infrarenal fusiform AAA measuring 3.1 cm x 2.6 cm. in following Stable dimensions of the bilateral common iliac artery aneurysms measuring 1.8 cm x 2.0 cm on the right and 1.4 cm x 2.0 cm on the left. This study was reviewed by Dr. Stanford Breed. He recommended repeating in 1 year.    LOS: 1 day    Brittainy M. Ladoris Gene 07/02/2015 7:51 AM  History and all data above reviewed.  Patient examined.  I agree with the findings as above.   CAD as above.  We will make sure that [facility] has been consulted.  No further chest pain. The patient exam reveals COR:RRR  ,  Lungs: Clear  ,  Abd: Positive bowel sounds, no rebound no guarding, Ext No edema, right wrist OK.    .  All available labs, radiology testing, previous records reviewed. Agree with documented assessment and plan. CAD:  [facility] consult for CABG.  He has been intolerant of Crestor and others.  He is willing to try  Pravachol.  He might ultimately need PCSK9.    Jeneen Rinks Kadejah Sandiford  9:15 AM  07/02/2015

## 2015-07-02 NOTE — Consult Note (Signed)
[address]       [place] [address]             [phone]        [name] Medical Record [id number] Date of Birth: July 06, 1933  Referring: Minus Breeding M.D.  Primary Care: Alesia Richards, MD  Chief Complaint:    Chief Complaint  Patient presents with  . Chest Pain   patient examined, coronary arterial grams and echocardiogram personally reviewed  History of Present Illness:     82 year old Caucasian male, not chronically ill presents with symptoms of unstable angina and mildly elevated cardiac enzymes. Several years ago he had PCI of his LAD and RCA. His echocardiogram shows preserved LV function without significant valvular disease. Coronary arteriograms show severe three-vessel CAD with 99% stenosis the LAD-diagonal. LVEDP is normal. Patient has been stable since catheterization without angina. I agree with the recommendation for CABG which will be scheduled for the first available OR opening.  The patient's family history is heavily positive for CAD in fact 7 of 9 siblings and his family have needed CABG or PCI  He has diet-controlled diabetes, hypertension, peripheral vascular disease(small abdominal aortic and bilateral iliac artery aneurysms) remote history of smoking  Current Activity/ Functional Status: Patient is very active and lives independently and works outside on is property regular rate   Zubrod Score: At the time of surgery this patient's most appropriate activity status/level should be described as: []     0    Normal activity, no symptoms []     1    Restricted in physical strenuous activity but ambulatory, able to do out light work [x]     2    Ambulatory and capable of self care, unable to do work activities, up and about                 more than 50%  Of the time                            []     3    Only limited self care, in bed greater than 50% of waking hours []     4    Completely disabled, no self  care, confined to bed or chair []     5    Moribund  Past Medical History  Diagnosis Date  . Coronary atherosclerosis of unspecified type of vessel, native or graft   . 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 Personal history of other diseases of digestive system   . Other and unspecified hyperlipidemia   . Colon polyps   . Gastritis, chronic   . IBS (irritable bowel syndrome)   . Hypertension   . Diabetes mellitus     Diet control   . Vitamin D deficiency   . Vitamin B 12 deficiency   . Esophageal reflux     Past Surgical History  Procedure Laterality Date  . Knee surgery    . Bladder surgery    . Coronary angioplasty with stent placement    . Cardiac catheterization N/A 07/01/2015    Procedure: Left Heart Cath and Coronary Angiography;  Surgeon: Wellington Hampshire, MD;  Location: [facility];  Service: Cardiovascular;  Laterality: N/A;    History  Smoking status  . Former Smoker  . Quit date: 07/16/1963  Smokeless tobacco  . Never Used    History  Alcohol Use  No   family history-father died of a MI at age 26, brother died of MI at age 1, Sr. died of MI at age 24, diabetes in grandmother  Social History   Social History  . Marital Status: Married    Spouse Name: N/A  . Number of Children: 4   . Years of Education: N/A   Occupational History  . Retired    Social History Main Topics  . Smoking status: Former Smoker    Quit date: 07/16/1963  . Smokeless tobacco: Never Used  . Alcohol Use: No  . Drug Use: No  . Sexual Activity: Not on file   Other Topics Concern  . Not on file   Social History Narrative   1 caffeine drink daily     Allergies  Allergen Reactions  . Cymbalta [Duloxetine Hcl]     Dizziness  . Keflex [Cephalexin] Other (See Comments)    Reaction: unknown   . Simvastatin Other (See Comments)    Reaction: unknown   . Sudafed [Pseudoephedrine]     Dizziness  . Prednisone Rash    Current Facility-Administered Medications  Medication  Dose Route Frequency Provider Last Rate Last Dose  . 0.9 %  sodium chloride infusion  250 mL Intravenous PRN Wellington Hampshire, MD      . acetaminophen (TYLENOL) tablet 650 mg  650 mg Oral Q4H PRN Wellington Hampshire, MD      . aspirin tablet 325 mg  325 mg Oral Daily Wellington Hampshire, MD   325 mg at 07/02/15 1100  . atenolol (TENORMIN) tablet 25 mg  25 mg Oral Daily Wellington Hampshire, MD   25 mg at 07/02/15 1100  . calcium carbonate (TUMS - dosed in mg elemental calcium) chewable tablet 400 mg of elemental calcium  2 tablet Oral Daily Wellington Hampshire, MD   400 mg of elemental calcium at 07/01/15 2214  . heparin ADULT infusion 100 units/mL (25000 units/250 mL)  1,150 Units/hr Intravenous Continuous Jens Som, RPH 11.5 mL/hr at 07/02/15 0502 1,150 Units/hr at 07/02/15 0502  . Magnesium TABS 200 mg  200 mg Oral Daily Wellington Hampshire, MD   200 mg at 07/02/15 1100  . multivitamin with minerals tablet 1 tablet  1 tablet Oral Daily Lelon Perla, MD   1 tablet at 07/02/15 1100  . nitroGLYCERIN (NITROSTAT) SL tablet 0.4 mg  0.4 mg Sublingual Q5 min PRN Wellington Hampshire, MD      . nitroGLYCERIN 50 mg in dextrose 5 % 250 mL (0.2 mg/mL) infusion  0-200 mcg/min Intravenous Titrated Brittainy Erie Noe, PA-C   Stopped at 07/01/15 1119  . ondansetron (ZOFRAN) injection 4 mg  4 mg Intravenous Q6H PRN Wellington Hampshire, MD      . pantoprazole (PROTONIX) EC tablet 40 mg  40 mg Oral Daily Wellington Hampshire, MD   40 mg at 07/02/15 1100  . rosuvastatin (CRESTOR) tablet 10 mg  10 mg Oral q1800 Wellington Hampshire, MD   10 mg at 07/02/15 1715  . sodium chloride flush (NS) 0.9 % injection 3 mL  3 mL Intravenous Q12H Wellington Hampshire, MD   3 mL at 07/01/15 2219  . sodium chloride flush (NS) 0.9 % injection 3 mL  3 mL Intravenous PRN Wellington Hampshire, MD        Prescriptions prior to admission  Medication Sig Dispense Refill Last Dose  . aspirin 325 MG tablet Take 325 mg by mouth daily.  07/01/2015 at Unknown time  .  atenolol (TENORMIN) 25 MG tablet Take 25 mg by mouth daily.   06/30/2015 at 0700  . calcium carbonate (TUMS - DOSED IN MG ELEMENTAL CALCIUM) 500 MG chewable tablet Chew 2 tablets by mouth daily.   07/01/2015 at Unknown time  . CALCIUM PO Take 1 tablet by mouth daily.   06/30/2015 at Unknown time  . Flaxseed, Linseed, 1000 MG CAPS Take 1,000 mg by mouth daily.    06/30/2015 at Unknown time  . gabapentin (NEURONTIN) 300 MG capsule TAKE ONE CAPSULE BY MOUTH THREE TIMES DAILY FOR NEURITIS PAIN 90 capsule PRN 06/30/2015 at Unknown time  . Magnesium 250 MG TABS Take 250 mg by mouth daily.   06/30/2015 at Unknown time  . multivitamin (THERAGRAN) per tablet Take 1 tablet by mouth daily.     06/30/2015 at Unknown time  . omeprazole (PRILOSEC) 20 MG capsule TAKE 2 CAPSULES BY MOUTH EVERY DAY FOR INDIGESTION (Patient taking differently: TAKE 20 MG BY MOUTH ONCE DAILY AS NEEDED FOR INDIGESTION) 180 capsule 3 06/30/2015 at Unknown time  . atenolol (TENORMIN) 50 MG tablet TAKE 1 TABLET BY MOUTH EVERY DAY (Patient not taking: Reported on 07/01/2015) 90 tablet 0 Taking  . glucose blood (ONE TOUCH ULTRA TEST) test strip Check glucose 1 time daily.  DX-R73.09 100 each 0 Taking  . nitroGLYCERIN (NITROSTAT) 0.4 MG SL tablet DISSOLVE 1 TABLET UNDER TONGUE EVERY 5 MINUTES AS NEEDED UP TO 3 DOSES 25 tablet 3 rescue    Family History  Problem Relation Age of Onset  . Heart attack Father     died age 1  . Heart attack Brother     died age 5  . Heart attack Sister     died age 20  . Colon cancer Sister   . Liver cancer Sister   . Diabetes Maternal Grandmother   . Colon polyps Sister     and brothers x 2      Review of Systems:      Cardiac Review of Systems: Y or N  Chest Pain [ yes   ]  Resting SOB [ no  ] Exertional SOB  [place]  ]  Orthopnea [no  ]   Pedal Edema [ no  ]    Palpitations [no  ] Syncope  [no  ]   Presyncope [no   ]  General Review of Systems: [Y] = yes [  ]=no Constitional: recent weight change [  ];  anorexia [  ]; fatigue [  ]; nausea [  ]; night sweats [  ]; fever [  ]; or chills [  ]                                                               Dental: poor dentition[  ]; Last Dentist visit: annually  Eye : blurred vision [  ]; diplopia [   ]; vision changes [  ];  Amaurosis fugax[  ]; Resp: cough [  ];  wheezing[  ];  hemoptysis[  ]; shortness of breath[  ]; paroxysmal nocturnal dyspnea[  ]; dyspnea on exertion[  ]; or orthopnea[  ];  GI:  gallstones[  ], vomiting[  ];  dysphagia[  ]; melena[  ];  hematochezia [  ];  heartburn[yes-reflux  ];   Hx of  Colonoscopy[  ]; GU: kidney stones [  ]; hematuria[  ];   dysuria [  ];  nocturia[  ];  history of     obstruction [  ]; urinary frequency [  ]remote history of trauma to his bladder requiring surgery after his tractor rolled on him. Now with normal urine stream and habits             Skin: rash, swelling[  ];, hair loss[  ];  peripheral edema[  ];  or itching[  ]; Musculosketetal: myalgias[  ];  joint swelling[  ];  joint erythema[  ];  joint pain[  ];  back pain[  ];  Heme/Lymph: bruising[  ];  bleeding[  ];  anemia[  ];  Neuro: TIA[  ];  headaches[  ];  stroke[  ];  vertigo[  ];  seizures[  ];   paresthesias[  ];  difficulty walking[  ];  Psych:depression[  ]; anxiety[  ];  Endocrine: diabetes[diet controlled with peripheral neuropathy both feet  ];  thyroid dysfunction[  ];  Immunizations: Flu [  ]; Pneumococcal[  ];  Other:right-hand dominant  Physical Exam: BP 125/67 mmHg  Pulse 57  Temp(Src) 98.2 F (36.8 C) (Oral)  Resp 18  Ht 6' (1.829 m)  Wt 192 lb 6.4 oz (87.272 kg)  BMI 26.09 kg/m2  SpO2 99%       Physical Exam  General: Very pleasant and vigorous 82 year old male no acute distress accompanied by his family HEENT: Normocephalic pupils equal , dentition adequate Neck: Supple without JVD, adenopathy, or bruit Chest: Clear to auscultation, symmetrical breath sounds, no rhonchi, no tenderness             or  deformity Cardiovascular: Regular rate and rhythm, no murmur, no gallop, peripheral pulses             palpable in all extremities Abdomen:  Soft, nontender, no palpable mass or organomegaly Extremities: Warm, well-perfused, no clubbing cyanosis edema or tenderness,              no venous stasis changes of the legs Rectal/GU: Deferred Neuro: Grossly non--focal and symmetrical throughout Skin: Clean and dry without rash or ulceration   Diagnostic Studies & Laboratory data:     Recent Radiology Findings:   Dg Chest 2 View  07/01/2015  CLINICAL DATA:  Severe chest pain beginning at 0600 hours, has eased off now, took aspirin, pain radiated to both arms, history hypertension, diabetes mellitus, coronary artery disease post stenting, former smoker, GERD EXAM: CHEST  2 VIEW COMPARISON:  01/07/2013 FINDINGS: Upper normal heart size with note of coronary stents. Tortuous aorta. Mediastinal contours and pulmonary vascularity normal. Bronchitic changes without infiltrate, pleural effusion or pneumothorax. Endplate spur formation at inferior thoracic spine. IMPRESSION: Bronchitic changes without acute infiltrate. Electronically Signed   By: Lavonia Dana M.D.   On: 07/01/2015 08:21     I have independently reviewed the above radiologic studies.  Recent Lab Findings: Lab Results  Component Value Date   WBC 6.6 07/02/2015   HGB 11.9* 07/02/2015   HCT 37.1* 07/02/2015   PLT 189 07/02/2015   GLUCOSE 118* 07/02/2015   CHOL 156 07/02/2015   TRIG 140 07/02/2015   HDL 25* 07/02/2015   LDLCALC 103* 07/02/2015   ALT 13 02/16/2015   AST 22 02/16/2015   NA 140 07/02/2015   K 4.1 07/02/2015   CL 105 07/02/2015   CREATININE 1.17 07/02/2015  BUN 16 07/02/2015   CO2 27 07/02/2015   TSH 2.252 10/13/2014   INR 1.10 07/01/2015   HGBA1C 5.9* 10/13/2014      Assessment / Plan:      severe three-vessel coronary disease with unstable angina hypertension   prediabetes   preserved LV function   The  patient would benefit from multivessel CABG. This will be scheduled on the first available OR opening-Monday, March 6. I discussed the procedure indications benefits and risks with the patient and he agrees to proceed. His pre-CABG carotid Dopplers, ABIs are normal.       07/02/2015 8:00 PM

## 2015-07-02 NOTE — Progress Notes (Signed)
UR Completed Atara Paterson Graves-Bigelow, RN,BSN [phone]  

## 2015-07-03 ENCOUNTER — Inpatient Hospital Stay (HOSPITAL_COMMUNITY): Payer: Medicare Other

## 2015-07-03 LAB — PULMONARY FUNCTION TEST
DL/VA % pred: 53 %
DL/VA: 2.51 ml/min/mmHg/L
DLCO cor % pred: 44 %
DLCO cor: 15.54 ml/min/mmHg
DLCO unc % pred: 41 %
DLCO unc: 14.38 ml/min/mmHg
FEF 25-75 Post: 2.28 L/sec
FEF 25-75 Pre: 2.17 L/sec
FEF2575-%Change-Post: 5 %
FEF2575-%Pred-Post: 105 %
FEF2575-%Pred-Pre: 100 %
FEV1-%Change-Post: 1 %
FEV1-%Pred-Post: 101 %
FEV1-%Pred-Pre: 100 %
FEV1-Post: 3.17 L
FEV1-Pre: 3.13 L
FEV1FVC-%Change-Post: 4 %
FEV1FVC-%Pred-Pre: 101 %
FEV6-%Change-Post: -1 %
FEV6-%Pred-Post: 99 %
FEV6-%Pred-Pre: 101 %
FEV6-Post: 4.06 L
FEV6-Pre: 4.14 L
FEV6FVC-%Change-Post: 1 %
FEV6FVC-%Pred-Post: 103 %
FEV6FVC-%Pred-Pre: 102 %
FVC-%Change-Post: -3 %
FVC-%Pred-Post: 95 %
FVC-%Pred-Pre: 98 %
FVC-Post: 4.16 L
FVC-Pre: 4.3 L
Post FEV1/FVC ratio: 76 %
Post FEV6/FVC ratio: 97 %
Pre FEV1/FVC ratio: 73 %
Pre FEV6/FVC Ratio: 96 %
RV % pred: 92 %
RV: 2.57 L
TLC % pred: 89 %
TLC: 6.69 L

## 2015-07-03 LAB — URINALYSIS, ROUTINE W REFLEX MICROSCOPIC
Bilirubin Urine: NEGATIVE
Glucose, UA: NEGATIVE mg/dL
Hgb urine dipstick: NEGATIVE
Ketones, ur: NEGATIVE mg/dL
Leukocytes, UA: NEGATIVE
Nitrite: NEGATIVE
Protein, ur: NEGATIVE mg/dL
Specific Gravity, Urine: 1.01 (ref 1.005–1.030)
pH: 7 (ref 5.0–8.0)

## 2015-07-03 LAB — CBC
HCT: 37.8 % — ABNORMAL LOW (ref 39.0–52.0)
Hemoglobin: 12.2 g/dL — ABNORMAL LOW (ref 13.0–17.0)
MCH: 30.8 pg (ref 26.0–34.0)
MCHC: 32.3 g/dL (ref 30.0–36.0)
MCV: 95.5 fL (ref 78.0–100.0)
Platelets: 184 10*3/uL (ref 150–400)
RBC: 3.96 MIL/uL — ABNORMAL LOW (ref 4.22–5.81)
RDW: 13.2 % (ref 11.5–15.5)
WBC: 7.3 10*3/uL (ref 4.0–10.5)

## 2015-07-03 LAB — HEPARIN LEVEL (UNFRACTIONATED): Heparin Unfractionated: 0.57 IU/mL (ref 0.30–0.70)

## 2015-07-03 LAB — SURGICAL PCR SCREEN
MRSA, PCR: NEGATIVE
Staphylococcus aureus: NEGATIVE

## 2015-07-03 MED ORDER — PRAVASTATIN SODIUM 40 MG PO TABS
40.0000 mg | ORAL_TABLET | Freq: Every day | ORAL | Status: DC
  Administered 2015-07-03 – 2015-07-10 (×7): 40 mg via ORAL
  Filled 2015-07-03 (×7): qty 1

## 2015-07-03 MED ORDER — DOCUSATE SODIUM 100 MG PO CAPS
100.0000 mg | ORAL_CAPSULE | Freq: Every day | ORAL | Status: DC | PRN
  Administered 2015-07-03 – 2015-07-05 (×3): 100 mg via ORAL
  Filled 2015-07-03 (×3): qty 1

## 2015-07-03 MED ORDER — ALBUTEROL SULFATE (2.5 MG/3ML) 0.083% IN NEBU
2.5000 mg | INHALATION_SOLUTION | Freq: Once | RESPIRATORY_TRACT | Status: AC
  Administered 2015-07-03: 2.5 mg via RESPIRATORY_TRACT

## 2015-07-03 MED ORDER — PSYLLIUM 95 % PO PACK
1.0000 | PACK | Freq: Every day | ORAL | Status: DC
  Administered 2015-07-03 – 2015-07-05 (×3): 1 via ORAL
  Filled 2015-07-03 (×3): qty 1

## 2015-07-03 NOTE — Progress Notes (Signed)
[place] for heparin Indication: chest pain/ACS  Allergies  Allergen Reactions  . Cymbalta [Duloxetine Hcl]     Dizziness  . Keflex [Cephalexin] Other (See Comments)    Reaction: unknown   . Simvastatin Other (See Comments)    Reaction: unknown   . Sudafed [Pseudoephedrine]     Dizziness  . Prednisone Rash    Patient Measurements: Height: 6' (182.9 cm) Weight: 192 lb 3.9 oz (87.2 kg) IBW/kg (Calculated) : 77.6 Heparin Dosing Weight: 89kg  Vital Signs: Temp: 98 F (36.7 C) (03/03 0532) Temp Source: Oral (03/03 0532) BP: 123/64 mmHg (03/03 0532) Pulse Rate: 63 (03/03 0532)  Labs:  Recent Labs  07/01/15 0805 07/01/15 1257 07/01/15 1455 07/02/15 0220 07/02/15 0530 07/02/15 1403 07/03/15 0430  HGB 13.0  --   --   --  11.9*  --  12.2*  HCT 40.8  --   --   --  37.1*  --  37.8*  PLT 192  --   --   --  189  --  184  LABPROT  --   --  14.4  --   --   --   --   INR  --   --  1.10  --   --   --   --   HEPARINUNFRC  --   --   --  0.55  --  0.50 0.57  CREATININE 1.23  --   --   --  1.17  --   --   TROPONINI 0.04* 0.26*  --   --   --   --   --     Estimated Creatinine Clearance: 55.3 mL/min (by C-G formula based on Cr of 1.17).   Medical History: Past Medical History  Diagnosis Date  . Coronary atherosclerosis of unspecified type of vessel, native or graft   . 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 Personal history of other diseases of digestive system   . Other and unspecified hyperlipidemia   . Colon polyps   . Gastritis, chronic   . IBS (irritable bowel syndrome)   . Hypertension   . Diabetes mellitus     Diet control   . Vitamin D deficiency   . Vitamin B 12 deficiency   . Esophageal reflux     Medications:  Infusions:  . heparin 1,150 Units/hr (07/03/15 0253)  . nitroGLYCERIN Stopped (07/01/15 1119)    Assessment: 28 yom presented to the ED with CP. Troponin mildly elevated. Not on anticoagulation PTA. To continue  on IV heparin post-cath on 3/1. Found significant 3V CAD - for CABG on 3/6. Hg 12.2 stable, plt wnl.  HL remains therapeutic (0.57) on 1150 units/h. No bleed/IV line issues reported.  Goal of Therapy:  Heparin level 0.3-0.7 units/ml Monitor platelets by anticoagulation protocol: Yes   Plan:  Heparin at 1150 units/h  Daily HL/CBC  Mon s/sx bleeding CABG 3/6  Elicia Lamp, PharmD, [facility] Clinical Pharmacist Pager [phone] 07/03/2015 10:06 AM

## 2015-07-03 NOTE — Care Management Important Message (Signed)
Important Message  Patient Details  Name: [name] MRN: [id number] Date of Birth: 01/30/1934   Medicare Important Message Given:  Yes    [name] Abena 07/03/2015, 11:40 AM

## 2015-07-03 NOTE — Progress Notes (Signed)
    SUBJECTIVE:  No chest pain.  No SOB   PHYSICAL EXAM Filed Vitals:   07/02/15 0513 07/02/15 1415 07/02/15 2224 07/03/15 0532  BP:  125/67 111/57 123/64  Pulse:  57  63  Temp:  98.2 F (36.8 C) 98.7 F (37.1 C) 98 F (36.7 C)  TempSrc:  Oral Oral Oral  Resp:  18 18 18   Height:      Weight: 192 lb 6.4 oz (87.272 kg)   192 lb 3.9 oz (87.2 kg)  SpO2:  99% 98% 95%   General:  No distress Lungs:  Clear Heart:  RRR Abdomen:  Positive bowel sounds, no rebound no guarding Extremities:  No edema   LABS: Lab Results  Component Value Date   TROPONINI 0.26* 07/01/2015   Results for orders placed or performed during the hospital encounter of 07/01/15 (from the past 24 hour(s))  Heparin level (unfractionated)     Status: None   Collection Time: 07/02/15  2:03 PM  Result Value Ref Range   Heparin Unfractionated 0.50 0.30 - 0.70 IU/mL  CBC     Status: Abnormal   Collection Time: 07/03/15  4:30 AM  Result Value Ref Range   WBC 7.3 4.0 - 10.5 K/uL   RBC 3.96 (L) 4.22 - 5.81 MIL/uL   Hemoglobin 12.2 (L) 13.0 - 17.0 g/dL   HCT 37.8 (L) 39.0 - 52.0 %   MCV 95.5 78.0 - 100.0 fL   MCH 30.8 26.0 - 34.0 pg   MCHC 32.3 30.0 - 36.0 g/dL   RDW 13.2 11.5 - 15.5 %   Platelets 184 150 - 400 K/uL  Heparin level (unfractionated)     Status: None   Collection Time: 07/03/15  4:30 AM  Result Value Ref Range   Heparin Unfractionated 0.57 0.30 - 0.70 IU/mL    Intake/Output Summary (Last 24 hours) at 07/03/15 0909 Last data filed at 07/03/15 0841  Gross per 24 hour  Intake    480 ml  Output      0 ml  Net    480 ml    Echo - Left ventricle: The cavity size was normal. There was mild concentric hypertrophy. Systolic function was normal. The estimated ejection fraction was in the range of 60% to 65%. Wall motion was normal; there were no regional wall motion abnormalities. Doppler parameters are consistent with abnormal left ventricular relaxation (grade 1 diastolic  dysfunction). - Aortic valve: Transvalvular velocity was within the normal range. There was no stenosis. There was no regurgitation. - Mitral valve: Transvalvular velocity was within the normal range. There was no evidence for stenosis. There was no regurgitation. - Left atrium: The atrium was mildly dilated. - Right ventricle: The cavity size was normal. Wall thickness was normal. Systolic function was normal. - Atrial septum: No defect or patent foramen ovale was identified by color flow Doppler. - Tricuspid valve: There was mild regurgitation. - Inferior vena cava: The vessel was normal in size.  ASSESSMENT AND PLAN:  UNSTABLE ANGINA:  CAD as described.  CABG on Monday.    DYSLIPIDEMIA:   He is willing to try Pravachol.    HTN:    The blood pressure is at target. No change in medications is indicated.  Jeneen Rinks [facility] 07/03/2015 9:09 AM

## 2015-07-03 NOTE — Progress Notes (Signed)
[id number] Discussed importance of walking and IS after surgery. Pt has IS and stated he can get to top. Discussed sternal precautions. Wife to be available at discharge to stay with pt. Gave OHS booklet and care guide. Wrote how to view pre op video. Pt did not want to walk at this time but stated will walk with wife later. Told pt not to walk if any CP or tightness. We will follow up after surgery. Graylon Good RN BSN 07/03/2015 11:52 AM

## 2015-07-04 DIAGNOSIS — I214 Non-ST elevation (NSTEMI) myocardial infarction: Principal | ICD-10-CM

## 2015-07-04 DIAGNOSIS — E782 Mixed hyperlipidemia: Secondary | ICD-10-CM

## 2015-07-04 DIAGNOSIS — I10 Essential (primary) hypertension: Secondary | ICD-10-CM

## 2015-07-04 LAB — HEPARIN LEVEL (UNFRACTIONATED): Heparin Unfractionated: 0.5 IU/mL (ref 0.30–0.70)

## 2015-07-04 LAB — CBC
HCT: 36.8 % — ABNORMAL LOW (ref 39.0–52.0)
Hemoglobin: 12.5 g/dL — ABNORMAL LOW (ref 13.0–17.0)
MCH: 32.6 pg (ref 26.0–34.0)
MCHC: 34 g/dL (ref 30.0–36.0)
MCV: 95.8 fL (ref 78.0–100.0)
Platelets: 189 10*3/uL (ref 150–400)
RBC: 3.84 MIL/uL — ABNORMAL LOW (ref 4.22–5.81)
RDW: 13.3 % (ref 11.5–15.5)
WBC: 8.1 10*3/uL (ref 4.0–10.5)

## 2015-07-04 MED ORDER — CALCIUM CARBONATE ANTACID 500 MG PO CHEW: 2.0000 | CHEWABLE_TABLET | Freq: Every day | ORAL | Status: DC | PRN

## 2015-07-04 MED ORDER — MAGNESIUM OXIDE 400 (241.3 MG) MG PO TABS
400.0000 mg | ORAL_TABLET | Freq: Every day | ORAL | Status: DC
  Administered 2015-07-04 – 2015-07-05 (×2): 400 mg via ORAL
  Filled 2015-07-04 (×2): qty 1

## 2015-07-04 NOTE — Progress Notes (Signed)
[place] for heparin Indication: chest pain/ACS  Allergies  Allergen Reactions  . Cymbalta [Duloxetine Hcl]     Dizziness  . Keflex [Cephalexin] Other (See Comments)    Reaction: unknown   . Simvastatin Other (See Comments)    Reaction: unknown   . Sudafed [Pseudoephedrine]     Dizziness  . Prednisone Rash    Patient Measurements: Height: 6' (182.9 cm) Weight: 190 lb 4.8 oz (86.32 kg) IBW/kg (Calculated) : 77.6 Heparin Dosing Weight: 89kg  Vital Signs: Temp: 98.3 F (36.8 C) (03/04 0500) Temp Source: Oral (03/04 0500) BP: 107/53 mmHg (03/04 0500) Pulse Rate: 70 (03/04 0500)  Labs:  Recent Labs  07/01/15 1257 07/01/15 1455  07/02/15 0530 07/02/15 1403 07/03/15 0430 07/04/15 0509  HGB  --   --   < > 11.9*  --  12.2* 12.5*  HCT  --   --   --  37.1*  --  37.8* 36.8*  PLT  --   --   --  189  --  184 189  LABPROT  --  14.4  --   --   --   --   --   INR  --  1.10  --   --   --   --   --   HEPARINUNFRC  --   --   < >  --  0.50 0.57 0.50  CREATININE  --   --   --  1.17  --   --   --   TROPONINI 0.26*  --   --   --   --   --   --   < > = values in this interval not displayed.  Estimated Creatinine Clearance: 55.3 mL/min (by C-G formula based on Cr of 1.17).   Medical History: Past Medical History  Diagnosis Date  . Coronary atherosclerosis of unspecified type of vessel, native or graft   . 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 Personal history of other diseases of digestive system   . Other and unspecified hyperlipidemia   . Colon polyps   . Gastritis, chronic   . IBS (irritable bowel syndrome)   . Hypertension   . Diabetes mellitus     Diet control   . Vitamin D deficiency   . Vitamin B 12 deficiency   . Esophageal reflux     Medications:  Infusions:  . heparin 1,150 Units/hr (07/04/15 0400)  . nitroGLYCERIN Stopped (07/01/15 1119)    Assessment: 82 yo m presented to the ED with CP. Troponin mildly elevated. Not  on anticoagulation PTA. To continue on IV heparin post-cath on 3/1. Found significant 3V CAD - for CABG on 3/6. Hg 12.5 stable, plt wnl.  HL remains therapeutic (0.50) on 1150 units/h. No bleed/IV line issues reported.  Goal of Therapy:  Heparin level 0.3-0.7 units/ml Monitor platelets by anticoagulation protocol: Yes   Plan:  - Continue heparin infusion at 1150 units/hr - Daily HL, CBC - Monitor s/s of bleeding - CABG 3/6  Cassie L. Nicole Kindred, PharmD PGY2 Infectious Diseases Pharmacy Resident Pager: [phone] 07/04/2015 12:29 PM

## 2015-07-04 NOTE — Progress Notes (Signed)
Patient Name: [name] Date of Encounter: 07/04/2015  Principal Problem:   Unstable angina [facility]) Active Problems:   Coronary atherosclerosis- s/p PCI to LAD in 2009 and PCI to RCA in 2011   Hypertension   Hyperlipidemia   Prediabetes   NSTEMI (non-ST elevated myocardial infarction) ([place])   Length of Stay: 3  SUBJECTIVE  Asymptomatic at rest and walking the halls briskly. For bypass surgery on Monday  CURRENT MEDS . aspirin  325 mg Oral Daily  . atenolol  25 mg Oral Daily  . magnesium oxide  400 mg Oral Daily  . multivitamin with minerals  1 tablet Oral Daily  . pantoprazole  40 mg Oral Daily  . pravastatin  40 mg Oral q1800  . psyllium  1 packet Oral Daily  . sodium chloride flush  3 mL Intravenous Q12H    OBJECTIVE   Intake/Output Summary (Last 24 hours) at 07/04/15 1019 Last data filed at 07/04/15 0805  Gross per 24 hour  Intake 1107.5 ml  Output    575 ml  Net  532.5 ml   Filed Weights   07/02/15 0513 07/03/15 0532 07/04/15 0500  Weight: 87.272 kg (192 lb 6.4 oz) 87.2 kg (192 lb 3.9 oz) 86.32 kg (190 lb 4.8 oz)    PHYSICAL EXAM Filed Vitals:   07/03/15 1334 07/03/15 2000 07/03/15 2320 07/04/15 0500  BP: 104/46 133/66 130/52 107/53  Pulse: 62 65 70 70  Temp: 97.8 F (36.6 C) 97.7 F (36.5 C) 98.1 F (36.7 C) 98.3 F (36.8 C)  TempSrc: Oral Oral Oral Oral  Resp: 18 20 18 18   Height:      Weight:    86.32 kg (190 lb 4.8 oz)  SpO2: 97% 98% 96% 94%   General: Alert, oriented x3, no distress Head: no evidence of trauma, PERRL, EOMI, no exophtalmos or lid lag, no myxedema, no xanthelasma; normal ears, nose and oropharynx Neck: normal jugular venous pulsations and no hepatojugular reflux; brisk carotid pulses without delay and no carotid bruits Chest: clear to auscultation, no signs of consolidation by percussion or palpation, normal fremitus, symmetrical and full respiratory excursions Cardiovascular: normal position and quality of the apical  impulse, regular rhythm, normal first and second heart sounds, no rubs or gallops, no murmur Abdomen: no tenderness or distention, no masses by palpation, no abnormal pulsatility or arterial bruits, normal bowel sounds, no hepatosplenomegaly Extremities: no clubbing, cyanosis or edema; 2+ radial, ulnar and brachial pulses bilaterally; 2+ right femoral, posterior tibial and dorsalis pedis pulses; 2+ left femoral, posterior tibial and dorsalis pedis pulses; no subclavian or femoral bruits Neurological: grossly nonfocal  LABS  CBC  Recent Labs  07/03/15 0430 07/04/15 0509  WBC 7.3 8.1  HGB 12.2* 12.5*  HCT 37.8* 36.8*  MCV 95.5 95.8  PLT 184 [id number]   Basic Metabolic Panel  Recent Labs  07/02/15 0530  NA 140  K 4.1  CL 105  CO2 27  GLUCOSE 118*  BUN 16  CREATININE 1.17  CALCIUM 8.9   Liver Function Tests No results for input(s): AST, ALT, ALKPHOS, BILITOT, PROT, ALBUMIN in the last 72 hours. No results for input(s): LIPASE, AMYLASE in the last 72 hours. Cardiac Enzymes  Recent Labs  07/01/15 1257  TROPONINI 0.26*   BNP Invalid input(s): POCBNP D-Dimer No results for input(s): DDIMER in the last 72 hours. Hemoglobin A1C No results for input(s): HGBA1C in the last 72 hours. Fasting Lipid Panel  Recent Labs  07/02/15 0530  CHOL 156  HDL 25*  LDLCALC 103*  TRIG 140  CHOLHDL 6.2   Thyroid Function Tests No results for input(s): TSH, T4TOTAL, T3FREE, THYROIDAB in the last 72 hours.  Invalid input(s): [place]  Radiology Studies Imaging results have been reviewed and No results found.  TELE NSR    ASSESSMENT AND PLAN  Scheduled for bypass surgery on Monday with Dr. Prescott Gum for severe three-vessel coronary disease presenting with unstable angina/very small NSTEMI, background borderline diabetes mellitus, hypertension, normal left ventricular systolic function    Sanda Klein, MD, [facility] [phone] office [phone]  pager 07/04/2015 10:19 AM

## 2015-07-04 NOTE — Progress Notes (Signed)
Pt has done well with the IS today. Pt has taken several walks, and denies any pain. Pt resting well with several visitors at the bedside throughout the day

## 2015-07-05 LAB — BLOOD GAS, ARTERIAL
Acid-base deficit: 0.1 mmol/L (ref 0.0–2.0)
Bicarbonate: 24.1 mEq/L — ABNORMAL HIGH (ref 20.0–24.0)
Drawn by: 103701
FIO2: 0.21
O2 Saturation: 96.7 %
Patient temperature: 98.6
TCO2: 25.3 mmol/L (ref 0–100)
pCO2 arterial: 39.3 mmHg (ref 35.0–45.0)
pH, Arterial: 7.404 (ref 7.350–7.450)
pO2, Arterial: 89.3 mmHg (ref 80.0–100.0)

## 2015-07-05 LAB — PREPARE RBC (CROSSMATCH)

## 2015-07-05 LAB — CBC
HCT: 39.3 % (ref 39.0–52.0)
Hemoglobin: 12.8 g/dL — ABNORMAL LOW (ref 13.0–17.0)
MCH: 31.2 pg (ref 26.0–34.0)
MCHC: 32.6 g/dL (ref 30.0–36.0)
MCV: 95.9 fL (ref 78.0–100.0)
Platelets: 192 10*3/uL (ref 150–400)
RBC: 4.1 MIL/uL — ABNORMAL LOW (ref 4.22–5.81)
RDW: 13.4 % (ref 11.5–15.5)
WBC: 8.3 10*3/uL (ref 4.0–10.5)

## 2015-07-05 LAB — PROTIME-INR
INR: 1.03 (ref 0.00–1.49)
Prothrombin Time: 13.7 seconds (ref 11.6–15.2)

## 2015-07-05 LAB — APTT: aPTT: 81 seconds — ABNORMAL HIGH (ref 24–37)

## 2015-07-05 LAB — ABO/RH: ABO/RH(D): B NEG

## 2015-07-05 LAB — HEPARIN LEVEL (UNFRACTIONATED): Heparin Unfractionated: 0.35 IU/mL (ref 0.30–0.70)

## 2015-07-05 MED ORDER — DIAZEPAM 5 MG PO TABS
5.0000 mg | ORAL_TABLET | Freq: Once | ORAL | Status: AC
  Administered 2015-07-06: 5 mg via ORAL
  Filled 2015-07-05: qty 1

## 2015-07-05 MED ORDER — METOPROLOL TARTRATE 12.5 MG HALF TABLET
12.5000 mg | ORAL_TABLET | Freq: Once | ORAL | Status: AC
  Administered 2015-07-06: 12.5 mg via ORAL
  Filled 2015-07-05: qty 1

## 2015-07-05 MED ORDER — BISACODYL 5 MG PO TBEC: 5.0000 mg | DELAYED_RELEASE_TABLET | Freq: Once | ORAL | Status: DC

## 2015-07-05 MED ORDER — FLEET ENEMA 7-19 GM/118ML RE ENEM
1.0000 | ENEMA | Freq: Every day | RECTAL | Status: DC | PRN
  Administered 2015-07-05: 1 via RECTAL
  Filled 2015-07-05: qty 1

## 2015-07-05 MED ORDER — PHENYLEPHRINE HCL 10 MG/ML IJ SOLN
30.0000 ug/min | INTRAMUSCULAR | Status: DC
  Administered 2015-07-06: 25 ug/min via INTRAVENOUS
  Filled 2015-07-05: qty 2

## 2015-07-05 MED ORDER — TEMAZEPAM 15 MG PO CAPS: 15.0000 mg | ORAL_CAPSULE | Freq: Once | ORAL | Status: DC | PRN

## 2015-07-05 MED ORDER — SODIUM CHLORIDE 0.9 % IV SOLN
INTRAVENOUS | Status: DC
  Administered 2015-07-06: 14 mL/h via INTRAVENOUS
  Filled 2015-07-05: qty 40

## 2015-07-05 MED ORDER — EPINEPHRINE HCL 1 MG/ML IJ SOLN
0.0000 ug/min | INTRAVENOUS | Status: DC
  Filled 2015-07-05: qty 4

## 2015-07-05 MED ORDER — BISACODYL 5 MG PO TBEC
5.0000 mg | DELAYED_RELEASE_TABLET | Freq: Once | ORAL | Status: AC
  Administered 2015-07-05: 5 mg via ORAL
  Filled 2015-07-05: qty 1

## 2015-07-05 MED ORDER — MAGNESIUM SULFATE 50 % IJ SOLN
40.0000 meq | INTRAMUSCULAR | Status: DC
  Filled 2015-07-05: qty 10

## 2015-07-05 MED ORDER — SODIUM CHLORIDE 0.9 % IV SOLN
INTRAVENOUS | Status: DC
  Administered 2015-07-06: 1 [IU]/h via INTRAVENOUS
  Filled 2015-07-05: qty 2.5

## 2015-07-05 MED ORDER — LEVOFLOXACIN IN D5W 500 MG/100ML IV SOLN
500.0000 mg | INTRAVENOUS | Status: DC
  Administered 2015-07-06: 500 mg via INTRAVENOUS
  Filled 2015-07-05 (×2): qty 100

## 2015-07-05 MED ORDER — SODIUM CHLORIDE 0.9 % IV SOLN
INTRAVENOUS | Status: DC
  Filled 2015-07-05: qty 30

## 2015-07-05 MED ORDER — CHLORHEXIDINE GLUCONATE 4 % EX LIQD
60.0000 mL | Freq: Once | CUTANEOUS | Status: AC
  Administered 2015-07-06: 4 via TOPICAL
  Filled 2015-07-05: qty 15

## 2015-07-05 MED ORDER — BISACODYL 10 MG RE SUPP: 10.0000 mg | Freq: Once | RECTAL | Status: DC

## 2015-07-05 MED ORDER — VANCOMYCIN HCL 10 G IV SOLR
1250.0000 mg | INTRAVENOUS | Status: DC
  Administered 2015-07-06: 1250 mg via INTRAVENOUS
  Filled 2015-07-05 (×2): qty 1250

## 2015-07-05 MED ORDER — DOPAMINE-DEXTROSE 3.2-5 MG/ML-% IV SOLN
0.0000 ug/kg/min | INTRAVENOUS | Status: DC
  Filled 2015-07-05: qty 250

## 2015-07-05 MED ORDER — PLASMA-LYTE 148 IV SOLN
INTRAVENOUS | Status: DC
  Filled 2015-07-05: qty 2.5

## 2015-07-05 MED ORDER — CHLORHEXIDINE GLUCONATE 4 % EX LIQD
60.0000 mL | Freq: Once | CUTANEOUS | Status: AC
  Administered 2015-07-05: 4 via TOPICAL
  Filled 2015-07-05: qty 60

## 2015-07-05 MED ORDER — NITROGLYCERIN IN D5W 200-5 MCG/ML-% IV SOLN: 2.0000 ug/min | INTRAVENOUS | Status: DC

## 2015-07-05 MED ORDER — POTASSIUM CHLORIDE 2 MEQ/ML IV SOLN
80.0000 meq | INTRAVENOUS | Status: DC
  Filled 2015-07-05: qty 40

## 2015-07-05 MED ORDER — CHLORHEXIDINE GLUCONATE 0.12 % MT SOLN
15.0000 mL | Freq: Once | OROMUCOSAL | Status: AC
  Administered 2015-07-06: 15 mL via OROMUCOSAL
  Filled 2015-07-05: qty 15

## 2015-07-05 MED ORDER — DEXMEDETOMIDINE HCL IN NACL 400 MCG/100ML IV SOLN
0.1000 ug/kg/h | INTRAVENOUS | Status: DC
  Administered 2015-07-06: .2 ug/kg/h via INTRAVENOUS
  Filled 2015-07-05: qty 100

## 2015-07-05 MED ORDER — DIAZEPAM 5 MG PO TABS
5.0000 mg | ORAL_TABLET | ORAL | Status: DC | PRN
  Administered 2015-07-06: 5 mg via ORAL

## 2015-07-05 NOTE — Progress Notes (Signed)
  Patient Name: [name] Date of Encounter: 07/05/2015  Principal Problem:   Unstable angina [facility]) Active Problems:   Coronary atherosclerosis- s/p PCI to LAD in 2009 and PCI to RCA in 2011   Hypertension   Hyperlipidemia   Prediabetes   NSTEMI (non-ST elevated myocardial infarction) ([place])   Length of Stay: 4  SUBJECTIVE  No angina or dyspnea. Complains of constipation despite walking, prune juice, miralax.  CURRENT MEDS . aspirin  325 mg Oral Daily  . atenolol  25 mg Oral Daily  . magnesium oxide  400 mg Oral Daily  . multivitamin with minerals  1 tablet Oral Daily  . pantoprazole  40 mg Oral Daily  . pravastatin  40 mg Oral q1800  . psyllium  1 packet Oral Daily  . sodium chloride flush  3 mL Intravenous Q12H    OBJECTIVE   Intake/Output Summary (Last 24 hours) at 07/05/15 0836 Last data filed at 07/05/15 0200  Gross per 24 hour  Intake    713 ml  Output      0 ml  Net    713 ml   Filed Weights   07/03/15 0532 07/04/15 0500 07/05/15 0530  Weight: 87.2 kg (192 lb 3.9 oz) 86.32 kg (190 lb 4.8 oz) 86.7 kg (191 lb 2.2 oz)    PHYSICAL EXAM Filed Vitals:   07/04/15 1400 07/04/15 1955 07/04/15 2344 07/05/15 0530  BP: 98/53 111/56 105/55 108/60  Pulse: 63 67 66   Temp: 98.3 F (36.8 C) 98.3 F (36.8 C) 98.7 F (37.1 C) 98.5 F (36.9 C)  TempSrc: Oral Oral Oral Oral  Resp: 18 18 18    Height:      Weight:    86.7 kg (191 lb 2.2 oz)  SpO2: 96% 96% 95% 96%   General: Alert, oriented x3, no distress Head: no evidence of trauma, PERRL, EOMI, no exophtalmos or lid lag, no myxedema, no xanthelasma; normal ears, nose and oropharynx Neck: normal jugular venous pulsations and no hepatojugular reflux; brisk carotid pulses without delay and no carotid bruits Chest: clear to auscultation, no signs of consolidation by percussion or palpation, normal fremitus, symmetrical and full respiratory excursions Cardiovascular: normal position and quality of the apical  impulse, regular rhythm, normal first and second heart sounds, no rubs or gallops, no murmur Abdomen: no tenderness or distention, no masses by palpation, no abnormal pulsatility or arterial bruits, normal bowel sounds, no hepatosplenomegaly Extremities: no clubbing, cyanosis or edema; 2+ radial, ulnar and brachial pulses bilaterally; 2+ right femoral, posterior tibial and dorsalis pedis pulses; 2+ left femoral, posterior tibial and dorsalis pedis pulses; no subclavian or femoral bruits Neurological: grossly nonfocal  LABS  CBC  Recent Labs  07/04/15 0509 07/05/15 0548  WBC 8.1 8.3  HGB 12.5* 12.8*  HCT 36.8* 39.3  MCV 95.8 95.9  PLT 189 192    Radiology Studies Imaging results have been reviewed and No results found.  TELE NSR   ASSESSMENT AND PLAN  Scheduled for bypass surgery on Monday with Dr. Prescott Gum for severe three-vessel coronary disease presenting with unstable angina/very small NSTEMI, background borderline diabetes mellitus, hypertension, normal left ventricular systolic function  Will try laxatives, enema if no success.  Sanda Klein, MD, [facility] [phone] office [phone] pager 07/05/2015 8:36 AM

## 2015-07-05 NOTE — Progress Notes (Signed)
Pt received Enema today. Very successful.

## 2015-07-05 NOTE — Progress Notes (Signed)
[place] for heparin Indication: chest pain/ACS  Allergies  Allergen Reactions  . Cymbalta [Duloxetine Hcl]     Dizziness  . Keflex [Cephalexin] Other (See Comments)    Reaction: unknown   . Simvastatin Other (See Comments)    Reaction: unknown   . Sudafed [Pseudoephedrine]     Dizziness  . Prednisone Rash    Patient Measurements: Height: 6' (182.9 cm) Weight: 191 lb 2.2 oz (86.7 kg) IBW/kg (Calculated) : 77.6 Heparin Dosing Weight: 89kg  Vital Signs: Temp: 98.5 F (36.9 C) (03/05 0530) Temp Source: Oral (03/05 0530) BP: 108/60 mmHg (03/05 0530) Pulse Rate: 66 (03/04 2344)  Labs:  Recent Labs  07/03/15 0430 07/04/15 0509 07/05/15 0548  HGB 12.2* 12.5* 12.8*  HCT 37.8* 36.8* 39.3  PLT 184 189 192  APTT  --   --  81*  LABPROT  --   --  13.7  INR  --   --  1.03  HEPARINUNFRC 0.57 0.50 0.35    Estimated Creatinine Clearance: 55.3 mL/min (by C-G formula based on Cr of 1.17).   Medical History: Past Medical History  Diagnosis Date  . Coronary atherosclerosis of unspecified type of vessel, native or graft   . 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 Personal history of other diseases of digestive system   . Other and unspecified hyperlipidemia   . Colon polyps   . Gastritis, chronic   . IBS (irritable bowel syndrome)   . Hypertension   . Diabetes mellitus     Diet control   . Vitamin D deficiency   . Vitamin B 12 deficiency   . Esophageal reflux     Medications:  Infusions:  . heparin 1,150 Units/hr (07/05/15 0200)  . nitroGLYCERIN Stopped (07/01/15 1119)    Assessment: 82 yo m presented to the ED with CP. Troponin mildly elevated. Not on anticoagulation PTA. To continue on IV heparin post-cath on 3/1. Found significant 3V CAD - for CABG on 3/6. Hg 12.5 stable, plt wnl.  HL remains therapeutic (0.35) on 1150 units/h. No bleed/IV line issues reported.  Goal of Therapy:  Heparin level 0.3-0.7 units/ml Monitor  platelets by anticoagulation protocol: Yes   Plan:  - Continue heparin infusion at 1150 units/hr - Daily HL, CBC - Monitor s/s of bleeding - CABG tomorrow 3/6  Cassie L. Nicole Kindred, PharmD PGY2 Infectious Diseases Pharmacy Resident Pager: [phone] 07/05/2015 10:24 AM

## 2015-07-06 ENCOUNTER — Inpatient Hospital Stay (HOSPITAL_COMMUNITY): Payer: Medicare Other

## 2015-07-06 ENCOUNTER — Inpatient Hospital Stay (HOSPITAL_COMMUNITY): Payer: Medicare Other | Admitting: Anesthesiology

## 2015-07-06 ENCOUNTER — Encounter (HOSPITAL_COMMUNITY): Payer: Self-pay | Admitting: Anesthesiology

## 2015-07-06 ENCOUNTER — Encounter (HOSPITAL_COMMUNITY)
Admission: EM | Disposition: A | Discharge status: Home Health | Payer: Self-pay | Source: Home / Self Care | Attending: Cardiothoracic Surgery

## 2015-07-06 DIAGNOSIS — I2510 Atherosclerotic heart disease of native coronary artery without angina pectoris: Secondary | ICD-10-CM

## 2015-07-06 DIAGNOSIS — Z951 Presence of aortocoronary bypass graft: Secondary | ICD-10-CM

## 2015-07-06 HISTORY — PX: TEE WITHOUT CARDIOVERSION: SHX5443

## 2015-07-06 HISTORY — PX: CORONARY ARTERY BYPASS GRAFT: SHX141

## 2015-07-06 LAB — POCT I-STAT, CHEM 8
BUN: 23 mg/dL — ABNORMAL HIGH (ref 6–20)
BUN: 20 mg/dL (ref 6–20)
BUN: 28 mg/dL — ABNORMAL HIGH (ref 6–20)
BUN: 21 mg/dL — ABNORMAL HIGH (ref 6–20)
BUN: 21 mg/dL — ABNORMAL HIGH (ref 6–20)
BUN: 22 mg/dL — ABNORMAL HIGH (ref 6–20)
BUN: 19 mg/dL (ref 6–20)
Calcium, Ion: 1.16 mmol/L (ref 1.13–1.30)
Calcium, Ion: 0.95 mmol/L — ABNORMAL LOW (ref 1.13–1.30)
Calcium, Ion: 1.2 mmol/L (ref 1.13–1.30)
Calcium, Ion: 1.03 mmol/L — ABNORMAL LOW (ref 1.13–1.30)
Calcium, Ion: 1.03 mmol/L — ABNORMAL LOW (ref 1.13–1.30)
Calcium, Ion: 0.98 mmol/L — ABNORMAL LOW (ref 1.13–1.30)
Calcium, Ion: 1.2 mmol/L (ref 1.13–1.30)
Chloride: 103 mmol/L (ref 101–111)
Chloride: 102 mmol/L (ref 101–111)
Chloride: 95 mmol/L — ABNORMAL LOW (ref 101–111)
Chloride: 100 mmol/L — ABNORMAL LOW (ref 101–111)
Chloride: 99 mmol/L — ABNORMAL LOW (ref 101–111)
Chloride: 102 mmol/L (ref 101–111)
Chloride: 101 mmol/L (ref 101–111)
Creatinine, Ser: 0.8 mg/dL (ref 0.61–1.24)
Creatinine, Ser: 0.8 mg/dL (ref 0.61–1.24)
Creatinine, Ser: 0.9 mg/dL (ref 0.61–1.24)
Creatinine, Ser: 0.9 mg/dL (ref 0.61–1.24)
Creatinine, Ser: 0.8 mg/dL (ref 0.61–1.24)
Creatinine, Ser: 1 mg/dL (ref 0.61–1.24)
Creatinine, Ser: 0.9 mg/dL (ref 0.61–1.24)
Glucose, Bld: 119 mg/dL — ABNORMAL HIGH (ref 65–99)
Glucose, Bld: 107 mg/dL — ABNORMAL HIGH (ref 65–99)
Glucose, Bld: 128 mg/dL — ABNORMAL HIGH (ref 65–99)
Glucose, Bld: 118 mg/dL — ABNORMAL HIGH (ref 65–99)
Glucose, Bld: 126 mg/dL — ABNORMAL HIGH (ref 65–99)
Glucose, Bld: 138 mg/dL — ABNORMAL HIGH (ref 65–99)
Glucose, Bld: 164 mg/dL — ABNORMAL HIGH (ref 65–99)
HCT: 36 % — ABNORMAL LOW (ref 39.0–52.0)
HCT: 24 % — ABNORMAL LOW (ref 39.0–52.0)
HCT: 33 % — ABNORMAL LOW (ref 39.0–52.0)
HCT: 26 % — ABNORMAL LOW (ref 39.0–52.0)
HCT: 27 % — ABNORMAL LOW (ref 39.0–52.0)
HCT: 26 % — ABNORMAL LOW (ref 39.0–52.0)
HCT: 30 % — ABNORMAL LOW (ref 39.0–52.0)
Hemoglobin: 12.2 g/dL — ABNORMAL LOW (ref 13.0–17.0)
Hemoglobin: 11.2 g/dL — ABNORMAL LOW (ref 13.0–17.0)
Hemoglobin: 8.2 g/dL — ABNORMAL LOW (ref 13.0–17.0)
Hemoglobin: 8.8 g/dL — ABNORMAL LOW (ref 13.0–17.0)
Hemoglobin: 9.2 g/dL — ABNORMAL LOW (ref 13.0–17.0)
Hemoglobin: 8.8 g/dL — ABNORMAL LOW (ref 13.0–17.0)
Hemoglobin: 10.2 g/dL — ABNORMAL LOW (ref 13.0–17.0)
Potassium: 4 mmol/L (ref 3.5–5.1)
Potassium: 4 mmol/L (ref 3.5–5.1)
Potassium: 4.5 mmol/L (ref 3.5–5.1)
Potassium: 4.3 mmol/L (ref 3.5–5.1)
Potassium: 4.5 mmol/L (ref 3.5–5.1)
Potassium: 4.4 mmol/L (ref 3.5–5.1)
Potassium: 4.3 mmol/L (ref 3.5–5.1)
Sodium: 140 mmol/L (ref 135–145)
Sodium: 136 mmol/L (ref 135–145)
Sodium: 138 mmol/L (ref 135–145)
Sodium: 137 mmol/L (ref 135–145)
Sodium: 137 mmol/L (ref 135–145)
Sodium: 136 mmol/L (ref 135–145)
Sodium: 139 mmol/L (ref 135–145)
TCO2: 28 mmol/L (ref 0–100)
TCO2: 27 mmol/L (ref 0–100)
TCO2: 32 mmol/L (ref 0–100)
TCO2: 27 mmol/L (ref 0–100)
TCO2: 26 mmol/L (ref 0–100)
TCO2: 25 mmol/L (ref 0–100)
TCO2: 24 mmol/L (ref 0–100)

## 2015-07-06 LAB — POCT I-STAT 3, ART BLOOD GAS (G3+)
Acid-Base Excess: 3 mmol/L — ABNORMAL HIGH (ref 0.0–2.0)
Acid-Base Excess: 1 mmol/L (ref 0.0–2.0)
Acid-base deficit: 1 mmol/L (ref 0.0–2.0)
Acid-base deficit: 2 mmol/L (ref 0.0–2.0)
Bicarbonate: 26.8 mEq/L — ABNORMAL HIGH (ref 20.0–24.0)
Bicarbonate: 25.6 mEq/L — ABNORMAL HIGH (ref 20.0–24.0)
Bicarbonate: 24.6 mEq/L — ABNORMAL HIGH (ref 20.0–24.0)
Bicarbonate: 24.2 mEq/L — ABNORMAL HIGH (ref 20.0–24.0)
O2 Saturation: 100 %
O2 Saturation: 99 %
O2 Saturation: 98 %
O2 Saturation: 98 %
Patient temperature: 35.7
Patient temperature: 36.6
Patient temperature: 36.9
TCO2: 28 mmol/L (ref 0–100)
TCO2: 27 mmol/L (ref 0–100)
TCO2: 26 mmol/L (ref 0–100)
TCO2: 26 mmol/L (ref 0–100)
pCO2 arterial: 39 mmHg (ref 35.0–45.0)
pCO2 arterial: 36.8 mmHg (ref 35.0–45.0)
pCO2 arterial: 45.5 mmHg — ABNORMAL HIGH (ref 35.0–45.0)
pCO2 arterial: 47.1 mmHg — ABNORMAL HIGH (ref 35.0–45.0)
pH, Arterial: 7.445 (ref 7.350–7.450)
pH, Arterial: 7.445 (ref 7.350–7.450)
pH, Arterial: 7.338 — ABNORMAL LOW (ref 7.350–7.450)
pH, Arterial: 7.318 — ABNORMAL LOW (ref 7.350–7.450)
pO2, Arterial: 342 mmHg — ABNORMAL HIGH (ref 80.0–100.0)
pO2, Arterial: 111 mmHg — ABNORMAL HIGH (ref 80.0–100.0)
pO2, Arterial: 114 mmHg — ABNORMAL HIGH (ref 80.0–100.0)
pO2, Arterial: 110 mmHg — ABNORMAL HIGH (ref 80.0–100.0)

## 2015-07-06 LAB — CBC
HCT: 38.8 % — ABNORMAL LOW (ref 39.0–52.0)
HCT: 31.5 % — ABNORMAL LOW (ref 39.0–52.0)
HCT: 29.8 % — ABNORMAL LOW (ref 39.0–52.0)
Hemoglobin: 12.9 g/dL — ABNORMAL LOW (ref 13.0–17.0)
Hemoglobin: 10.4 g/dL — ABNORMAL LOW (ref 13.0–17.0)
Hemoglobin: 9.8 g/dL — ABNORMAL LOW (ref 13.0–17.0)
MCH: 32.1 pg (ref 26.0–34.0)
MCH: 31.2 pg (ref 26.0–34.0)
MCH: 31.2 pg (ref 26.0–34.0)
MCHC: 33.2 g/dL (ref 30.0–36.0)
MCHC: 33 g/dL (ref 30.0–36.0)
MCHC: 32.9 g/dL (ref 30.0–36.0)
MCV: 96.5 fL (ref 78.0–100.0)
MCV: 94.6 fL (ref 78.0–100.0)
MCV: 94.9 fL (ref 78.0–100.0)
Platelets: 188 10*3/uL (ref 150–400)
Platelets: 121 10*3/uL — ABNORMAL LOW (ref 150–400)
Platelets: 131 10*3/uL — ABNORMAL LOW (ref 150–400)
RBC: 4.02 MIL/uL — ABNORMAL LOW (ref 4.22–5.81)
RBC: 3.33 MIL/uL — ABNORMAL LOW (ref 4.22–5.81)
RBC: 3.14 MIL/uL — ABNORMAL LOW (ref 4.22–5.81)
RDW: 13.5 % (ref 11.5–15.5)
RDW: 13.3 % (ref 11.5–15.5)
RDW: 13.4 % (ref 11.5–15.5)
WBC: 8.8 10*3/uL (ref 4.0–10.5)
WBC: 10.9 10*3/uL — ABNORMAL HIGH (ref 4.0–10.5)
WBC: 10.3 10*3/uL (ref 4.0–10.5)

## 2015-07-06 LAB — BASIC METABOLIC PANEL
Anion gap: 10 (ref 5–15)
BUN: 23 mg/dL — ABNORMAL HIGH (ref 6–20)
CO2: 28 mmol/L (ref 22–32)
Calcium: 9.2 mg/dL (ref 8.9–10.3)
Chloride: 104 mmol/L (ref 101–111)
Creatinine, Ser: 1.39 mg/dL — ABNORMAL HIGH (ref 0.61–1.24)
GFR calc Af Amer: 54 mL/min — ABNORMAL LOW (ref >60)
GFR calc non Af Amer: 46 mL/min — ABNORMAL LOW (ref >60)
Glucose, Bld: 120 mg/dL — ABNORMAL HIGH (ref 65–99)
Potassium: 4.5 mmol/L (ref 3.5–5.1)
Sodium: 142 mmol/L (ref 135–145)

## 2015-07-06 LAB — CREATININE, SERUM
Creatinine, Ser: 1.08 mg/dL (ref 0.61–1.24)
GFR calc Af Amer: >60 mL/min (ref >60)
GFR calc non Af Amer: >60 mL/min (ref >60)

## 2015-07-06 LAB — POCT I-STAT 4, (NA,K, GLUC, HGB,HCT)
Glucose, Bld: 127 mg/dL — ABNORMAL HIGH (ref 65–99)
HCT: 33 % — ABNORMAL LOW (ref 39.0–52.0)
Hemoglobin: 11.2 g/dL — ABNORMAL LOW (ref 13.0–17.0)
Potassium: 4 mmol/L (ref 3.5–5.1)
Sodium: 136 mmol/L (ref 135–145)

## 2015-07-06 LAB — HEMOGLOBIN AND HEMATOCRIT, BLOOD
HCT: 26 % — ABNORMAL LOW (ref 39.0–52.0)
Hemoglobin: 8.9 g/dL — ABNORMAL LOW (ref 13.0–17.0)

## 2015-07-06 LAB — HEMOGLOBIN A1C
Hgb A1c MFr Bld: 6.2 % — ABNORMAL HIGH (ref 4.8–5.6)
Mean Plasma Glucose: 131 mg/dL

## 2015-07-06 LAB — GLUCOSE, CAPILLARY
Glucose-Capillary: 117 mg/dL — ABNORMAL HIGH (ref 65–99)
Glucose-Capillary: 109 mg/dL — ABNORMAL HIGH (ref 65–99)
Glucose-Capillary: 117 mg/dL — ABNORMAL HIGH (ref 65–99)
Glucose-Capillary: 109 mg/dL — ABNORMAL HIGH (ref 65–99)
Glucose-Capillary: 106 mg/dL — ABNORMAL HIGH (ref 65–99)
Glucose-Capillary: 81 mg/dL (ref 65–99)

## 2015-07-06 LAB — POCT I-STAT 3, VENOUS BLOOD GAS (G3P V)
Acid-Base Excess: 1 mmol/L (ref 0.0–2.0)
Bicarbonate: 25.5 mEq/L — ABNORMAL HIGH (ref 20.0–24.0)
O2 Saturation: 74 %
TCO2: 27 mmol/L (ref 0–100)
pCO2, Ven: 41.5 mmHg — ABNORMAL LOW (ref 45.0–50.0)
pH, Ven: 7.396 — ABNORMAL HIGH (ref 7.250–7.300)
pO2, Ven: 39 mmHg (ref 30.0–45.0)

## 2015-07-06 LAB — APTT: aPTT: 36 seconds (ref 24–37)

## 2015-07-06 LAB — PROTIME-INR
INR: 1.45 (ref 0.00–1.49)
Prothrombin Time: 17.7 seconds — ABNORMAL HIGH (ref 11.6–15.2)

## 2015-07-06 LAB — MAGNESIUM: Magnesium: 2.7 mg/dL — ABNORMAL HIGH (ref 1.7–2.4)

## 2015-07-06 LAB — PLATELET COUNT: Platelets: 130 10*3/uL — ABNORMAL LOW (ref 150–400)

## 2015-07-06 LAB — HEPARIN LEVEL (UNFRACTIONATED): Heparin Unfractionated: 0.32 IU/mL (ref 0.30–0.70)

## 2015-07-06 SURGERY — CORONARY ARTERY BYPASS GRAFTING (CABG)
Anesthesia: General | Site: Chest

## 2015-07-06 MED ORDER — DOPAMINE-DEXTROSE 3.2-5 MG/ML-% IV SOLN: 2.0000 ug/kg/min | INTRAVENOUS | Status: DC

## 2015-07-06 MED ORDER — ALBUMIN HUMAN 5 % IV SOLN
INTRAVENOUS | Status: DC | PRN
  Administered 2015-07-06: 13:00:00 via INTRAVENOUS

## 2015-07-06 MED ORDER — FENTANYL CITRATE (PF) 100 MCG/2ML IJ SOLN
INTRAMUSCULAR | Status: DC | PRN
  Administered 2015-07-06: 250 ug via INTRAVENOUS
  Administered 2015-07-06: 150 ug via INTRAVENOUS
  Administered 2015-07-06: 250 ug via INTRAVENOUS
  Administered 2015-07-06: 100 ug via INTRAVENOUS
  Administered 2015-07-06 (×3): 250 ug via INTRAVENOUS

## 2015-07-06 MED ORDER — ROCURONIUM BROMIDE 50 MG/5ML IV SOLN
INTRAVENOUS | Status: AC
  Filled 2015-07-06: qty 1

## 2015-07-06 MED ORDER — LIDOCAINE HCL (CARDIAC) 20 MG/ML IV SOLN
INTRAVENOUS | Status: AC
  Filled 2015-07-06: qty 5

## 2015-07-06 MED ORDER — HEPARIN SODIUM (PORCINE) 1000 UNIT/ML IJ SOLN
INTRAMUSCULAR | Status: AC
  Filled 2015-07-06: qty 1

## 2015-07-06 MED ORDER — CALCIUM CHLORIDE 10 % IV SOLN
1.0000 g | Freq: Once | INTRAVENOUS | Status: AC
  Administered 2015-07-06: 1 g via INTRAVENOUS

## 2015-07-06 MED ORDER — LACTATED RINGERS IV SOLN
INTRAVENOUS | Status: DC
  Administered 2015-07-06: 17:00:00 via INTRAVENOUS

## 2015-07-06 MED ORDER — PLASMA-LYTE 148 IV SOLN
INTRAVENOUS | Status: DC | PRN
  Administered 2015-07-06: 500 mL via INTRAVASCULAR

## 2015-07-06 MED ORDER — OXYCODONE HCL 5 MG PO TABS
5.0000 mg | ORAL_TABLET | ORAL | Status: DC | PRN
  Administered 2015-07-06: 5 mg via ORAL
  Administered 2015-07-07 (×3): 10 mg via ORAL
  Administered 2015-07-07: 5 mg via ORAL
  Administered 2015-07-07 (×2): 10 mg via ORAL
  Filled 2015-07-06: qty 1
  Filled 2015-07-06 (×3): qty 2
  Filled 2015-07-06: qty 1
  Filled 2015-07-06 (×2): qty 2

## 2015-07-06 MED ORDER — SODIUM CHLORIDE 0.9% FLUSH: 3.0000 mL | INTRAVENOUS | Status: DC | PRN

## 2015-07-06 MED ORDER — ACETAMINOPHEN 500 MG PO TABS
1000.0000 mg | ORAL_TABLET | Freq: Four times a day (QID) | ORAL | Status: DC
  Administered 2015-07-07 – 2015-07-11 (×17): 1000 mg via ORAL
  Filled 2015-07-06 (×16): qty 2

## 2015-07-06 MED ORDER — CHLORHEXIDINE GLUCONATE 0.12 % MT SOLN
15.0000 mL | OROMUCOSAL | Status: AC
  Administered 2015-07-06: 15 mL via OROMUCOSAL
  Filled 2015-07-06: qty 15

## 2015-07-06 MED ORDER — PROTAMINE SULFATE 10 MG/ML IV SOLN
INTRAVENOUS | Status: AC
  Filled 2015-07-06: qty 25

## 2015-07-06 MED ORDER — ASPIRIN EC 325 MG PO TBEC
325.0000 mg | DELAYED_RELEASE_TABLET | Freq: Every day | ORAL | Status: DC
  Administered 2015-07-07: 325 mg via ORAL
  Filled 2015-07-06: qty 1

## 2015-07-06 MED ORDER — MORPHINE SULFATE (PF) 2 MG/ML IV SOLN: 1.0000 mg | INTRAVENOUS | Status: AC | PRN

## 2015-07-06 MED ORDER — POTASSIUM CHLORIDE 10 MEQ/50ML IV SOLN
10.0000 meq | INTRAVENOUS | Status: AC
Start: 2015-07-06 — End: 2015-07-06

## 2015-07-06 MED ORDER — METOPROLOL TARTRATE 1 MG/ML IV SOLN: 2.5000 mg | INTRAVENOUS | Status: DC | PRN

## 2015-07-06 MED ORDER — INSULIN REGULAR BOLUS VIA INFUSION
0.0000 [IU] | Freq: Three times a day (TID) | INTRAVENOUS | Status: DC
  Filled 2015-07-06: qty 10

## 2015-07-06 MED ORDER — DEXAMETHASONE SODIUM PHOSPHATE 4 MG/ML IJ SOLN
INTRAMUSCULAR | Status: AC
  Filled 2015-07-06: qty 2

## 2015-07-06 MED ORDER — DOPAMINE-DEXTROSE 3.2-5 MG/ML-% IV SOLN
INTRAVENOUS | Status: AC
  Administered 2015-07-06: 2 ug/kg/min
  Filled 2015-07-06: qty 250

## 2015-07-06 MED ORDER — LACTATED RINGERS IV SOLN
INTRAVENOUS | Status: DC | PRN
  Administered 2015-07-06: 07:00:00 via INTRAVENOUS

## 2015-07-06 MED ORDER — ANTISEPTIC ORAL RINSE SOLUTION (CORINZ)
7.0000 mL | Freq: Four times a day (QID) | OROMUCOSAL | Status: DC
  Administered 2015-07-07 – 2015-07-09 (×4): 7 mL via OROMUCOSAL

## 2015-07-06 MED ORDER — VECURONIUM BROMIDE 10 MG IV SOLR
INTRAVENOUS | Status: AC
  Filled 2015-07-06: qty 10

## 2015-07-06 MED ORDER — FENTANYL CITRATE (PF) 250 MCG/5ML IJ SOLN
INTRAMUSCULAR | Status: AC
  Filled 2015-07-06: qty 20

## 2015-07-06 MED ORDER — MIDAZOLAM HCL 10 MG/2ML IJ SOLN
INTRAMUSCULAR | Status: AC
  Filled 2015-07-06: qty 2

## 2015-07-06 MED ORDER — VECURONIUM BROMIDE 10 MG IV SOLR
INTRAVENOUS | Status: DC | PRN
  Administered 2015-07-06 (×2): 5 mg via INTRAVENOUS
  Administered 2015-07-06: 4 mg via INTRAVENOUS
  Administered 2015-07-06: 6 mg via INTRAVENOUS

## 2015-07-06 MED ORDER — METOPROLOL TARTRATE 12.5 MG HALF TABLET
12.5000 mg | ORAL_TABLET | Freq: Two times a day (BID) | ORAL | Status: DC
  Administered 2015-07-07 – 2015-07-09 (×5): 12.5 mg via ORAL
  Filled 2015-07-06 (×5): qty 1

## 2015-07-06 MED ORDER — METOCLOPRAMIDE HCL 5 MG/ML IJ SOLN
10.0000 mg | Freq: Four times a day (QID) | INTRAMUSCULAR | Status: AC
  Administered 2015-07-06 – 2015-07-07 (×3): 10 mg via INTRAVENOUS
  Filled 2015-07-06 (×3): qty 2

## 2015-07-06 MED ORDER — LEVOFLOXACIN IN D5W 750 MG/150ML IV SOLN
750.0000 mg | INTRAVENOUS | Status: AC
  Administered 2015-07-07: 750 mg via INTRAVENOUS
  Filled 2015-07-06: qty 150

## 2015-07-06 MED ORDER — GELATIN ABSORBABLE MT POWD
OROMUCOSAL | Status: DC | PRN
  Administered 2015-07-06: 12 mL via TOPICAL

## 2015-07-06 MED ORDER — PANTOPRAZOLE SODIUM 40 MG PO TBEC
40.0000 mg | DELAYED_RELEASE_TABLET | Freq: Every day | ORAL | Status: DC
  Administered 2015-07-08 – 2015-07-11 (×4): 40 mg via ORAL
  Filled 2015-07-06 (×4): qty 1

## 2015-07-06 MED ORDER — ACETAMINOPHEN 650 MG RE SUPP
650.0000 mg | Freq: Once | RECTAL | Status: AC
  Administered 2015-07-06: 650 mg via RECTAL

## 2015-07-06 MED ORDER — LACTATED RINGERS IV SOLN: INTRAVENOUS | Status: DC

## 2015-07-06 MED ORDER — FENTANYL CITRATE (PF) 250 MCG/5ML IJ SOLN
INTRAMUSCULAR | Status: AC
  Filled 2015-07-06: qty 5

## 2015-07-06 MED ORDER — TRAMADOL HCL 50 MG PO TABS
50.0000 mg | ORAL_TABLET | ORAL | Status: DC | PRN
  Administered 2015-07-06: 50 mg via ORAL
  Filled 2015-07-06: qty 1

## 2015-07-06 MED ORDER — LACTATED RINGERS IV SOLN: 500.0000 mL | Freq: Once | INTRAVENOUS | Status: DC | PRN

## 2015-07-06 MED ORDER — VANCOMYCIN HCL IN DEXTROSE 1-5 GM/200ML-% IV SOLN
1000.0000 mg | Freq: Once | INTRAVENOUS | Status: AC
  Administered 2015-07-06: 1000 mg via INTRAVENOUS
  Filled 2015-07-06: qty 200

## 2015-07-06 MED ORDER — SODIUM CHLORIDE 0.9 % IV SOLN: 250.0000 mL | INTRAVENOUS | Status: DC

## 2015-07-06 MED ORDER — SODIUM CHLORIDE 0.45 % IV SOLN
INTRAVENOUS | Status: DC | PRN
  Administered 2015-07-06: 15:00:00 via INTRAVENOUS

## 2015-07-06 MED ORDER — PROTAMINE SULFATE 10 MG/ML IV SOLN
INTRAVENOUS | Status: DC | PRN
  Administered 2015-07-06: 280 mg via INTRAVENOUS

## 2015-07-06 MED ORDER — MAGNESIUM SULFATE 4 GM/100ML IV SOLN
4.0000 g | Freq: Once | INTRAVENOUS | Status: AC
  Administered 2015-07-06: 4 g via INTRAVENOUS
  Filled 2015-07-06: qty 100

## 2015-07-06 MED ORDER — PROTAMINE SULFATE 10 MG/ML IV SOLN
INTRAVENOUS | Status: AC
  Filled 2015-07-06: qty 5

## 2015-07-06 MED ORDER — AMINOCAPROIC ACID 250 MG/ML IV SOLN
INTRAVENOUS | Status: DC | PRN
  Administered 2015-07-06: 5 g via INTRAVENOUS

## 2015-07-06 MED ORDER — PROPOFOL 10 MG/ML IV BOLUS
INTRAVENOUS | Status: DC | PRN
  Administered 2015-07-06 (×2): 50 mg via INTRAVENOUS

## 2015-07-06 MED ORDER — ASPIRIN 81 MG PO CHEW
324.0000 mg | CHEWABLE_TABLET | Freq: Every day | ORAL | Status: DC
  Filled 2015-07-06: qty 4

## 2015-07-06 MED ORDER — CHLORHEXIDINE GLUCONATE 0.12% ORAL RINSE (MEDLINE KIT)
15.0000 mL | Freq: Two times a day (BID) | OROMUCOSAL | Status: DC
  Administered 2015-07-06 – 2015-07-09 (×3): 15 mL via OROMUCOSAL

## 2015-07-06 MED ORDER — BISACODYL 10 MG RE SUPP: 10.0000 mg | Freq: Every day | RECTAL | Status: DC

## 2015-07-06 MED ORDER — PHENYLEPHRINE HCL 10 MG/ML IJ SOLN
INTRAMUSCULAR | Status: DC | PRN
  Administered 2015-07-06: 80 ug via INTRAVENOUS

## 2015-07-06 MED ORDER — MIDAZOLAM HCL 2 MG/2ML IJ SOLN: 2.0000 mg | INTRAMUSCULAR | Status: DC | PRN

## 2015-07-06 MED ORDER — NITROGLYCERIN IN D5W 200-5 MCG/ML-% IV SOLN: 0.0000 ug/min | INTRAVENOUS | Status: DC

## 2015-07-06 MED ORDER — MORPHINE SULFATE (PF) 2 MG/ML IV SOLN
2.0000 mg | INTRAVENOUS | Status: DC | PRN
  Administered 2015-07-07 (×3): 2 mg via INTRAVENOUS
  Filled 2015-07-06 (×3): qty 1

## 2015-07-06 MED ORDER — SODIUM CHLORIDE 0.9% FLUSH
3.0000 mL | Freq: Two times a day (BID) | INTRAVENOUS | Status: DC
  Administered 2015-07-07 – 2015-07-10 (×4): 3 mL via INTRAVENOUS

## 2015-07-06 MED ORDER — ONDANSETRON HCL 4 MG/2ML IJ SOLN
INTRAMUSCULAR | Status: AC
  Filled 2015-07-06: qty 2

## 2015-07-06 MED ORDER — MIDAZOLAM HCL 5 MG/5ML IJ SOLN
INTRAMUSCULAR | Status: DC | PRN
  Administered 2015-07-06: 4 mg via INTRAVENOUS
  Administered 2015-07-06: 1 mg via INTRAVENOUS
  Administered 2015-07-06: 2 mg via INTRAVENOUS
  Administered 2015-07-06: 3 mg via INTRAVENOUS

## 2015-07-06 MED ORDER — ROCURONIUM BROMIDE 100 MG/10ML IV SOLN
INTRAVENOUS | Status: DC | PRN
  Administered 2015-07-06: 50 mg via INTRAVENOUS

## 2015-07-06 MED ORDER — DEXTROSE 5 % IV SOLN
0.0000 ug/min | INTRAVENOUS | Status: DC
  Filled 2015-07-06: qty 2

## 2015-07-06 MED ORDER — ALBUMIN HUMAN 5 % IV SOLN
250.0000 mL | INTRAVENOUS | Status: AC | PRN
  Administered 2015-07-06 (×2): 250 mL via INTRAVENOUS
  Filled 2015-07-06: qty 250

## 2015-07-06 MED ORDER — SODIUM CHLORIDE 0.9 % IV SOLN
INTRAVENOUS | Status: DC
  Administered 2015-07-06: 19:00:00 via INTRAVENOUS
  Filled 2015-07-06 (×2): qty 2.5

## 2015-07-06 MED ORDER — SODIUM CHLORIDE 0.9 % IV SOLN
INTRAVENOUS | Status: DC
  Administered 2015-07-06: 15:00:00 via INTRAVENOUS

## 2015-07-06 MED ORDER — ACETAMINOPHEN 160 MG/5ML PO SOLN: 1000.0000 mg | Freq: Four times a day (QID) | ORAL | Status: DC

## 2015-07-06 MED ORDER — HEMOSTATIC AGENTS (NO CHARGE) OPTIME
TOPICAL | Status: DC | PRN
  Administered 2015-07-06 (×2): 1 via TOPICAL

## 2015-07-06 MED ORDER — DOCUSATE SODIUM 100 MG PO CAPS
200.0000 mg | ORAL_CAPSULE | Freq: Every day | ORAL | Status: DC
  Administered 2015-07-07 – 2015-07-11 (×4): 200 mg via ORAL
  Filled 2015-07-06 (×5): qty 2

## 2015-07-06 MED ORDER — 0.9 % SODIUM CHLORIDE (POUR BTL) OPTIME
TOPICAL | Status: DC | PRN
  Administered 2015-07-06: 1000 mL
  Administered 2015-07-06: 5000 mL

## 2015-07-06 MED ORDER — ONDANSETRON HCL 4 MG/2ML IJ SOLN
4.0000 mg | Freq: Four times a day (QID) | INTRAMUSCULAR | Status: DC | PRN
Start: 2015-07-06 — End: 2015-07-11

## 2015-07-06 MED ORDER — BISACODYL 5 MG PO TBEC
10.0000 mg | DELAYED_RELEASE_TABLET | Freq: Every day | ORAL | Status: DC
  Administered 2015-07-07 – 2015-07-11 (×4): 10 mg via ORAL
  Filled 2015-07-06 (×5): qty 2

## 2015-07-06 MED ORDER — HEPARIN SODIUM (PORCINE) 1000 UNIT/ML IJ SOLN
INTRAMUSCULAR | Status: DC | PRN
  Administered 2015-07-06 (×2): 2000 [IU] via INTRAVENOUS
  Administered 2015-07-06: 28000 [IU] via INTRAVENOUS

## 2015-07-06 MED ORDER — FAMOTIDINE IN NACL 20-0.9 MG/50ML-% IV SOLN
20.0000 mg | Freq: Two times a day (BID) | INTRAVENOUS | Status: AC
  Administered 2015-07-06: 20 mg via INTRAVENOUS

## 2015-07-06 MED ORDER — ACETAMINOPHEN 160 MG/5ML PO SOLN: 650.0000 mg | Freq: Once | ORAL | Status: AC

## 2015-07-06 MED ORDER — METOPROLOL TARTRATE 25 MG/10 ML ORAL SUSPENSION: 12.5000 mg | Freq: Two times a day (BID) | ORAL | Status: DC

## 2015-07-06 MED ORDER — PROPOFOL 10 MG/ML IV BOLUS
INTRAVENOUS | Status: AC
  Filled 2015-07-06: qty 20

## 2015-07-06 MED ORDER — DEXMEDETOMIDINE HCL IN NACL 200 MCG/50ML IV SOLN: 0.0000 ug/kg/h | INTRAVENOUS | Status: DC

## 2015-07-06 MED FILL — Heparin Sodium (Porcine) Inj 1000 Unit/ML: INTRAMUSCULAR | Qty: 30 | Status: AC

## 2015-07-06 MED FILL — Potassium Chloride Inj 2 mEq/ML: INTRAVENOUS | Qty: 40 | Status: AC

## 2015-07-06 MED FILL — Magnesium Sulfate Inj 50%: INTRAMUSCULAR | Qty: 10 | Status: AC

## 2015-07-06 SURGICAL SUPPLY — 104 items
ADAPTER CARDIO PERF ANTE/RETRO (ADAPTER) ×4 IMPLANT
ADH SKN CLS APL DERMABOND .7 (GAUZE/BANDAGES/DRESSINGS) ×2
ADPR PRFSN 84XANTGRD RTRGD (ADAPTER) ×2
BAG DECANTER FOR FLEXI CONT (MISCELLANEOUS) ×4 IMPLANT
BANDAGE ACE 4X5 VEL STRL LF (GAUZE/BANDAGES/DRESSINGS) ×2 IMPLANT
BANDAGE ACE 6X5 VEL STRL LF (GAUZE/BANDAGES/DRESSINGS) ×2 IMPLANT
BANDAGE ELASTIC 4 VELCRO ST LF (GAUZE/BANDAGES/DRESSINGS) ×6 IMPLANT
BANDAGE ELASTIC 6 VELCRO ST LF (GAUZE/BANDAGES/DRESSINGS) ×6 IMPLANT
BASKET HEART  (ORDER IN 25'S) (MISCELLANEOUS) ×1
BASKET HEART (ORDER IN 25'S) (MISCELLANEOUS) ×1
BASKET HEART (ORDER IN 25S) (MISCELLANEOUS) ×2 IMPLANT
BLADE 11 SAFETY STRL DISP (BLADE) ×2 IMPLANT
BLADE STERNUM SYSTEM 6 (BLADE) ×4 IMPLANT
BLADE SURG 12 STRL SS (BLADE) ×4 IMPLANT
BLADE SURG ROTATE 9660 (MISCELLANEOUS) IMPLANT
BNDG GAUZE ELAST 4 BULKY (GAUZE/BANDAGES/DRESSINGS) ×4 IMPLANT
CANISTER SUCTION 2500CC (MISCELLANEOUS) ×4 IMPLANT
CANNULA GUNDRY RCSP 15FR (MISCELLANEOUS) ×4 IMPLANT
CATH CPB KIT VANTRIGT (MISCELLANEOUS) ×4 IMPLANT
CATH ROBINSON RED A/P 18FR (CATHETERS) ×12 IMPLANT
CATH THORACIC 36FR RT ANG (CATHETERS) ×4 IMPLANT
CLIP FOGARTY SPRING 6M (CLIP) ×2 IMPLANT
CLIP RETRACTION 3.0MM CORONARY (MISCELLANEOUS) ×2 IMPLANT
CLIP TI WIDE RED SMALL 24 (CLIP) ×4 IMPLANT
COVER SURGICAL LIGHT HANDLE (MISCELLANEOUS) ×4 IMPLANT
CRADLE DONUT ADULT HEAD (MISCELLANEOUS) ×4 IMPLANT
DERMABOND ADVANCED (GAUZE/BANDAGES/DRESSINGS) ×2
DERMABOND ADVANCED .7 DNX12 (GAUZE/BANDAGES/DRESSINGS) IMPLANT
DRAIN CHANNEL 32F RND 10.7 FF (WOUND CARE) ×6 IMPLANT
DRAPE CARDIOVASCULAR INCISE (DRAPES) ×4
DRAPE SLUSH/WARMER DISC (DRAPES) ×4 IMPLANT
DRAPE SRG 135X102X78XABS (DRAPES) ×2 IMPLANT
DRSG AQUACEL AG ADV 3.5X14 (GAUZE/BANDAGES/DRESSINGS) ×4 IMPLANT
DRSG KUZMA FLUFF (GAUZE/BANDAGES/DRESSINGS) ×2 IMPLANT
ELECT BLADE 4.0 EZ CLEAN MEGAD (MISCELLANEOUS) ×8
ELECT BLADE 6.5 EXT (BLADE) ×4 IMPLANT
ELECT CAUTERY BLADE 6.4 (BLADE) ×4 IMPLANT
ELECT REM PT RETURN 9FT ADLT (ELECTROSURGICAL) ×8
ELECTRODE BLDE 4.0 EZ CLN MEGD (MISCELLANEOUS) ×2 IMPLANT
ELECTRODE REM PT RTRN 9FT ADLT (ELECTROSURGICAL) ×4 IMPLANT
FELT TEFLON 1X6 (MISCELLANEOUS) ×4 IMPLANT
GAUZE SPONGE 4X4 12PLY STRL (GAUZE/BANDAGES/DRESSINGS) ×8 IMPLANT
GLOVE BIO SURGEON STRL SZ 6.5 (GLOVE) ×10 IMPLANT
GLOVE BIO SURGEON STRL SZ7.5 (GLOVE) ×12 IMPLANT
GLOVE BIO SURGEONS STRL SZ 6.5 (GLOVE) ×10
GOWN STRL REUS W/ TWL LRG LVL3 (GOWN DISPOSABLE) ×8 IMPLANT
GOWN STRL REUS W/TWL LRG LVL3 (GOWN DISPOSABLE) ×16
HEMOSTAT POWDER SURGIFOAM 1G (HEMOSTASIS) ×12 IMPLANT
HEMOSTAT SURGICEL 2X14 (HEMOSTASIS) ×4 IMPLANT
INSERT FOGARTY XLG (MISCELLANEOUS) IMPLANT
KIT BASIN OR (CUSTOM PROCEDURE TRAY) ×4 IMPLANT
KIT ROOM TURNOVER OR (KITS) ×4 IMPLANT
KIT SUCTION CATH 14FR (SUCTIONS) ×4 IMPLANT
KIT VASOVIEW W/TROCAR VH 2000 (KITS) ×4 IMPLANT
LEAD PACING MYOCARDI (MISCELLANEOUS) ×4 IMPLANT
MARKER GRAFT CORONARY BYPASS (MISCELLANEOUS) ×12 IMPLANT
NS IRRIG 1000ML POUR BTL (IV SOLUTION) ×20 IMPLANT
PACK OPEN HEART (CUSTOM PROCEDURE TRAY) ×4 IMPLANT
PAD ARMBOARD 7.5X6 YLW CONV (MISCELLANEOUS) ×8 IMPLANT
PAD ELECT DEFIB RADIOL ZOLL (MISCELLANEOUS) ×4 IMPLANT
PENCIL BUTTON HOLSTER BLD 10FT (ELECTRODE) ×6 IMPLANT
PUNCH AORTIC ROT 4.0MM RCL 40 (MISCELLANEOUS) ×2 IMPLANT
PUNCH AORTIC ROTATE 4.0MM (MISCELLANEOUS) IMPLANT
PUNCH AORTIC ROTATE 4.5MM 8IN (MISCELLANEOUS) IMPLANT
PUNCH AORTIC ROTATE 5MM 8IN (MISCELLANEOUS) IMPLANT
SET CARDIOPLEGIA MPS 5001102 (MISCELLANEOUS) ×2 IMPLANT
SOLUTION ANTI FOG 6CC (MISCELLANEOUS) ×2 IMPLANT
SPONGE LAP 18X18 X RAY DECT (DISPOSABLE) ×2 IMPLANT
SURGIFLO W/THROMBIN 8M KIT (HEMOSTASIS) ×4 IMPLANT
SUT BONE WAX W31G (SUTURE) ×4 IMPLANT
SUT MNCRL AB 4-0 PS2 18 (SUTURE) ×2 IMPLANT
SUT PROLENE 3 0 SH DA (SUTURE) IMPLANT
SUT PROLENE 3 0 SH1 36 (SUTURE) IMPLANT
SUT PROLENE 4 0 RB 1 (SUTURE) ×4
SUT PROLENE 4 0 SH DA (SUTURE) ×4 IMPLANT
SUT PROLENE 4-0 RB1 .5 CRCL 36 (SUTURE) ×2 IMPLANT
SUT PROLENE 5 0 C 1 36 (SUTURE) IMPLANT
SUT PROLENE 6 0 C 1 30 (SUTURE) IMPLANT
SUT PROLENE 6 0 CC (SUTURE) ×12 IMPLANT
SUT PROLENE 8 0 BV175 6 (SUTURE) ×2 IMPLANT
SUT PROLENE BLUE 7 0 (SUTURE) ×4 IMPLANT
SUT PROLENE POLY MONO (SUTURE) ×2 IMPLANT
SUT SILK  1 MH (SUTURE)
SUT SILK 1 MH (SUTURE) IMPLANT
SUT SILK 2 0 SH CR/8 (SUTURE) ×2 IMPLANT
SUT SILK 3 0 SH CR/8 (SUTURE) ×2 IMPLANT
SUT STEEL 6MS V (SUTURE) ×6 IMPLANT
SUT STEEL SZ 6 DBL 3X14 BALL (SUTURE) ×4 IMPLANT
SUT VIC AB 1 CTX 36 (SUTURE) ×20
SUT VIC AB 1 CTX36XBRD ANBCTR (SUTURE) ×4 IMPLANT
SUT VIC AB 2-0 CT1 27 (SUTURE) ×4
SUT VIC AB 2-0 CT1 TAPERPNT 27 (SUTURE) IMPLANT
SUT VIC AB 2-0 CTX 27 (SUTURE) IMPLANT
SUT VIC AB 3-0 X1 27 (SUTURE) IMPLANT
SUTURE E-PAK OPEN HEART (SUTURE) ×4 IMPLANT
SYSTEM SAHARA CHEST DRAIN ATS (WOUND CARE) ×4 IMPLANT
TAPE CLOTH SURG 4X10 WHT LF (GAUZE/BANDAGES/DRESSINGS) ×2 IMPLANT
TAPE PAPER 2X10 WHT MICROPORE (GAUZE/BANDAGES/DRESSINGS) ×2 IMPLANT
TOWEL OR 17X24 6PK STRL BLUE (TOWEL DISPOSABLE) ×8 IMPLANT
TOWEL OR 17X26 10 PK STRL BLUE (TOWEL DISPOSABLE) ×8 IMPLANT
TRAY FOLEY IC TEMP SENS 16FR (CATHETERS) ×4 IMPLANT
TUBING INSUFFLATION (TUBING) ×4 IMPLANT
UNDERPAD 30X30 INCONTINENT (UNDERPADS AND DIAPERS) ×4 IMPLANT
WATER STERILE IRR 1000ML POUR (IV SOLUTION) ×8 IMPLANT

## 2015-07-06 NOTE — Transfer of Care (Signed)
Immediate Anesthesia Transfer of Care Note  Patient: [name]  Procedure(s) Performed: Procedure(s): CORONARY ARTERY BYPASS GRAFTING (CABG)x 4   utilizing the left internal mammary artery and endoscopically harvested bilateral  sapheneous vein. (N/A) TRANSESOPHAGEAL ECHOCARDIOGRAM (TEE) (N/A)  Patient Location: SICU  Anesthesia Type:General  Level of Consciousness: sedated and Patient remains intubated per anesthesia plan  Airway & Oxygen Therapy: Patient remains intubated per anesthesia plan and Patient placed on Ventilator (see vital sign flow sheet for setting)  Post-op Assessment: Report given to RN and Post -op Vital signs reviewed and stable  Post vital signs: Reviewed and stable  Last Vitals:  Filed Vitals:   07/05/15 1930 07/06/15 0400  BP: 129/69 113/60  Pulse: 77 75  Temp: 37.1 C 37 C  Resp: 18 20    Complications: No apparent anesthesia complications

## 2015-07-06 NOTE — Anesthesia Preprocedure Evaluation (Addendum)
Anesthesia Evaluation  Patient identified by MRN, date of birth, ID band Patient awake    Reviewed: Allergy & Precautions, NPO status , Patient's Chart, lab work & pertinent test results  Airway Mallampati: II  TM Distance: <3 FB Neck ROM: Full    Dental  (+) Dental Advisory Given, Upper Dentures, Partial Lower, Missing   Pulmonary former smoker,    Pulmonary exam normal breath sounds clear to auscultation       Cardiovascular hypertension, + angina + CAD, + Past MI and + Peripheral Vascular Disease  Normal cardiovascular exam Rhythm:Regular Rate:Normal  Echo 07/01/15: Study Conclusions  - Left ventricle: The cavity size was normal. There was mildconcentric hypertrophy. Systolic function was normal. Theestimated ejection fraction was in the range of 60% to 65%. Wallmotion was normal; there were no regional wall motionabnormalities. Doppler parameters are consistent with abnormalleft ventricular relaxation (grade 1 diastolic dysfunction). - Aortic valve: Transvalvular velocity was within the normal range.There was no stenosis. There was no regurgitation. - Mitral valve: Transvalvular velocity was within the normal range.There was no evidence for stenosis. There was no regurgitation. - Left atrium: The atrium was mildly dilated. - Right ventricle: The cavity size was normal. Wall thickness wasnormal. Systolic function was normal. - Atrial septum: No defect or patent foramen ovale was identifiedby color flow Doppler. - Tricuspid valve: There was mild regurgitation. - Inferior vena cava: The vessel was normal in size.     Neuro/Psych negative neurological ROS  negative psych ROS   GI/Hepatic Neg liver ROS, GERD  ,  Endo/Other  negative endocrine ROSdiabetes  Renal/GU negative Renal ROS     Musculoskeletal negative musculoskeletal ROS (+)   Abdominal   Peds  Hematology  (+) Blood dyscrasia, anemia ,   Anesthesia  Other Findings Day of surgery medications reviewed with the patient.  Reproductive/Obstetrics                          Anesthesia Physical Anesthesia Plan  ASA: IV  Anesthesia Plan: General   Post-op Pain Management:    Induction: Intravenous  Airway Management Planned: Oral ETT  Additional Equipment: Arterial line, TEE, CVP, Ultrasound Guidance Line Placement and PA Cath  Intra-op Plan:   Post-operative Plan: Post-operative intubation/ventilation  Informed Consent: I have reviewed the patients History and Physical, chart, labs and discussed the procedure including the risks, benefits and alternatives for the proposed anesthesia with the patient or authorized representative who has indicated his/her understanding and acceptance.   Dental advisory given  Plan Discussed with: CRNA  Anesthesia Plan Comments: (Risks/benefits of general anesthesia discussed with patient including risk of damage to teeth, lips, gum, and tongue, nausea/vomiting, allergic reactions to medications, and the possibility of heart attack, stroke and death.  All patient questions answered.  Patient wishes to proceed.)        Anesthesia Quick Evaluation

## 2015-07-06 NOTE — Progress Notes (Signed)
EKG CRITICAL VALUE     12 lead EKG performed.  Critical value noted.  Kathleen Argue, RN notified.   Yehuda Mao, [place] 07/06/2015 2:54 PM

## 2015-07-06 NOTE — Progress Notes (Signed)
Pt clipped from neck to toes as ordered. Second Hibiclens shower done. Pt NPO since midnight. Pre-op medications administered.

## 2015-07-06 NOTE — Anesthesia Procedure Notes (Signed)
Procedures Procedures: Right IJ Gordy Councilman Catheter Insertion: [id number]: The patient was identified and consent obtained.  TO was performed, and full barrier precautions were used.  The skin was anesthetized with lidocaine-4cc plain with 25g needle.  Once the vein was located with the 22 ga. needle using ultrasound guidance , the wire was inserted into the vein.  The wire location was confirmed with ultrasound.  The tissue was dilated and the 8.5 [place] cordis catheter was carefully inserted. Afterwards Gordy Councilman catheter was inserted. PA catheter at 45cm.  The patient tolerated the procedure well.

## 2015-07-06 NOTE — Care Management Note (Signed)
Case Management Note  Patient Details  Name: [name] MRN: [id number] Date of Birth: 1933-06-05  Subjective/Objective: Pt admitted for chest pain- NStemi. Post cath on 07-01-15 revealed 3 vessel CAD. CABG 07-06-15.                   Action/Plan: CM will continue to monitor for disposition needs.    Expected Discharge Date:                  Expected Discharge Plan:  [place]  In-House Referral:     Discharge planning Services  CM Consult  Post Acute Care Choice:    Choice offered to:     DME Arranged:    DME Agency:     HH Arranged:    [place] Agency:     Status of Service:  In process, will continue to follow  Medicare Important Message Given:  Yes Date Medicare IM Given:    Medicare IM give by:    Date Additional Medicare IM Given:    Additional Medicare Important Message give by:     If discussed at [place] of Stay Meetings, dates discussed:    Additional Comments:  Bethena Roys, RN 07/06/2015, 3:31 PM

## 2015-07-06 NOTE — Progress Notes (Signed)
Pt voided. Name band and Blood Bank band on. Family at bedside. Belongings given to wife Ebby. Heparin gtt d/c'd. Pt assisted on to stretcher.

## 2015-07-06 NOTE — Procedures (Signed)
Extubation Procedure Note  Patient Details:   Name: [name] DOB: 12-09-1933 MRN: [id number]   Airway Documentation:     Evaluation  O2 sats: stable throughout Complications: No apparent complications Patient did tolerate procedure well. Bilateral Breath Sounds: Clear, Diminished   Yes  NIF/FVC -40/1.8L Incentive spirometer instructed 1554ml   Revonda Standard 07/06/2015, 6:59 PM

## 2015-07-06 NOTE — Brief Op Note (Signed)
07/01/2015 - 07/06/2015  11:57 AM  PATIENT:  [name]  82 y.o. male  PRE-OPERATIVE DIAGNOSIS:  CAD  POST-OPERATIVE DIAGNOSIS:  CAD  PROCEDURE:  Procedure(s):  CORONARY ARTERY BYPASS GRAFTING x 4 -LIMA to LAD -SVG to DIAGONAL -SVG to Left Circumflex -SVG to RCA  ENDOSCOPIC HARVEST GREATER SAPHENOUS VEIN  -Right and Left Thigh-   TRANSESOPHAGEAL ECHOCARDIOGRAM (TEE) (N/A)  SURGEON:  Surgeon(s) and Role:    * Ivin Poot, MD - Primary  PHYSICIAN ASSISTANT: Ellwood Handler PA-C  ANESTHESIA:   general  EBL:  Total I/O In: -  Out: 200 [Urine:200]  BLOOD ADMINISTERED:CELLSAVER  DRAINS: Left Pleural Chest Tube, Mediastinal Chest drains   LOCAL MEDICATIONS USED:  NONE  SPECIMEN:  No Specimen  DISPOSITION OF SPECIMEN:  N/A  COUNTS:  YES  TOURNIQUET:  * No tourniquets in log *  DICTATION: .Dragon Dictation  PLAN OF CARE: Admit to inpatient   PATIENT DISPOSITION:  ICU - intubated and hemodynamically stable.   Delay start of Pharmacological VTE agent (>24hrs) due to surgical blood loss or risk of bleeding: yes

## 2015-07-06 NOTE — Progress Notes (Signed)
  Echocardiogram Echocardiogram Transesophageal has been performed.  Bobbye Charleston 07/06/2015, 8:42 AM

## 2015-07-06 NOTE — Progress Notes (Signed)
Patient ID: [name], male   DOB: 11-09-1933, 82 y.o.   MRN: [id number] EVENING ROUNDS NOTE :     [address]       [place] [address]             [phone]                 Day of Surgery Procedure(s) (LRB): CORONARY ARTERY BYPASS GRAFTING (CABG)x 4   utilizing the left internal mammary artery and endoscopically harvested bilateral  sapheneous vein. (N/A) TRANSESOPHAGEAL ECHOCARDIOGRAM (TEE) (N/A)  Total Length of Stay:  LOS: 5 days  BP 101/59 mmHg  Pulse 80  Temp(Src) 98.2 F (36.8 C) (Core (Comment))  Resp 13  Ht 6' (1.829 m)  Wt 188 lb 7.9 oz (85.5 kg)  BMI 25.56 kg/m2  SpO2 100%  .Intake/Output      03/06 0701 - 03/07 0700   P.O.    I.V. (mL/kg) 281.1 (3.3)   Blood 455   NG/GT 30   IV Piggyback 650   Total Intake(mL/kg) 1416.1 (16.6)   Urine (mL/kg/hr) 1950 (1.7)   Blood 1625 (1.4)   Chest Tube 180 (0.2)   Total Output 3755   Net -2338.9         . sodium chloride 10 mL/hr at 07/06/15 1900  . [START ON 07/07/2015] sodium chloride    . sodium chloride 10 mL/hr at 07/06/15 1900  . dexmedetomidine Stopped (07/06/15 1630)  . DOPamine 2 mcg/kg/min (07/06/15 1900)  . insulin (NOVOLIN-R) infusion 0.2 Units/hr (07/06/15 2000)  . lactated ringers 10 mL/hr at 07/06/15 1900  . lactated ringers 10 mL/hr at 07/06/15 1725  . nitroGLYCERIN Stopped (07/06/15 1500)  . phenylephrine (NEO-SYNEPHRINE) Adult infusion Stopped (07/06/15 1646)     Lab Results  Component Value Date   WBC 10.9* 07/06/2015   HGB 11.2* 07/06/2015   HCT 33.0* 07/06/2015   PLT 121* 07/06/2015   GLUCOSE 127* 07/06/2015   CHOL 156 07/02/2015   TRIG 140 07/02/2015   HDL 25* 07/02/2015   LDLCALC 103* 07/02/2015   ALT 13 02/16/2015   AST 22 02/16/2015   NA 136 07/06/2015   K 4.0 07/06/2015   CL 102 07/06/2015   CREATININE 1.00 07/06/2015   BUN 22* 07/06/2015   CO2 28 07/06/2015   TSH 2.252 10/13/2014   PSA 1.12 10/07/2013   INR 1.45 07/06/2015   HGBA1C 6.2*  07/05/2015   MICROALBUR 0.4 10/13/2014   CABG today , now extubated, neuro intact not bleeding  Grace Isaac MD  Beeper [phone] Office [phone] 07/06/2015 8:47 PM

## 2015-07-06 NOTE — OR Nursing (Signed)
SICU Calls:  1st call at 12:33noon; 2nd call at 1300pm; 3rd call at 1322pm.      Marykay Lex Filemon Breton,RN

## 2015-07-07 ENCOUNTER — Encounter (HOSPITAL_COMMUNITY): Payer: Self-pay | Admitting: Cardiothoracic Surgery

## 2015-07-07 ENCOUNTER — Inpatient Hospital Stay (HOSPITAL_COMMUNITY): Payer: Medicare Other

## 2015-07-07 LAB — GLUCOSE, CAPILLARY
Glucose-Capillary: 132 mg/dL — ABNORMAL HIGH (ref 65–99)
Glucose-Capillary: 132 mg/dL — ABNORMAL HIGH (ref 65–99)
Glucose-Capillary: 141 mg/dL — ABNORMAL HIGH (ref 65–99)
Glucose-Capillary: 143 mg/dL — ABNORMAL HIGH (ref 65–99)
Glucose-Capillary: 144 mg/dL — ABNORMAL HIGH (ref 65–99)
Glucose-Capillary: 107 mg/dL — ABNORMAL HIGH (ref 65–99)
Glucose-Capillary: 111 mg/dL — ABNORMAL HIGH (ref 65–99)
Glucose-Capillary: 113 mg/dL — ABNORMAL HIGH (ref 65–99)
Glucose-Capillary: 114 mg/dL — ABNORMAL HIGH (ref 65–99)
Glucose-Capillary: 116 mg/dL — ABNORMAL HIGH (ref 65–99)
Glucose-Capillary: 78 mg/dL (ref 65–99)
Glucose-Capillary: 108 mg/dL — ABNORMAL HIGH (ref 65–99)
Glucose-Capillary: 111 mg/dL — ABNORMAL HIGH (ref 65–99)
Glucose-Capillary: 112 mg/dL — ABNORMAL HIGH (ref 65–99)
Glucose-Capillary: 123 mg/dL — ABNORMAL HIGH (ref 65–99)
Glucose-Capillary: 124 mg/dL — ABNORMAL HIGH (ref 65–99)
Glucose-Capillary: 131 mg/dL — ABNORMAL HIGH (ref 65–99)
Glucose-Capillary: 134 mg/dL — ABNORMAL HIGH (ref 65–99)
Glucose-Capillary: 141 mg/dL — ABNORMAL HIGH (ref 65–99)
Glucose-Capillary: 143 mg/dL — ABNORMAL HIGH (ref 65–99)
Glucose-Capillary: 162 mg/dL — ABNORMAL HIGH (ref 65–99)

## 2015-07-07 LAB — POCT I-STAT, CHEM 8
BUN: 19 mg/dL (ref 6–20)
Calcium, Ion: 1.16 mmol/L (ref 1.13–1.30)
Chloride: 99 mmol/L — ABNORMAL LOW (ref 101–111)
Creatinine, Ser: 1 mg/dL (ref 0.61–1.24)
Glucose, Bld: 154 mg/dL — ABNORMAL HIGH (ref 65–99)
HCT: 32 % — ABNORMAL LOW (ref 39.0–52.0)
Hemoglobin: 10.9 g/dL — ABNORMAL LOW (ref 13.0–17.0)
Potassium: 4.1 mmol/L (ref 3.5–5.1)
Sodium: 135 mmol/L (ref 135–145)
TCO2: 25 mmol/L (ref 0–100)

## 2015-07-07 LAB — CREATININE, SERUM
Creatinine, Ser: 1.16 mg/dL (ref 0.61–1.24)
GFR calc Af Amer: >60 mL/min (ref >60)
GFR calc non Af Amer: 58 mL/min — ABNORMAL LOW (ref >60)

## 2015-07-07 LAB — CBC
HCT: 30.2 % — ABNORMAL LOW (ref 39.0–52.0)
HCT: 31.4 % — ABNORMAL LOW (ref 39.0–52.0)
Hemoglobin: 9.8 g/dL — ABNORMAL LOW (ref 13.0–17.0)
Hemoglobin: 10.5 g/dL — ABNORMAL LOW (ref 13.0–17.0)
MCH: 30.7 pg (ref 26.0–34.0)
MCH: 32.1 pg (ref 26.0–34.0)
MCHC: 32.5 g/dL (ref 30.0–36.0)
MCHC: 33.4 g/dL (ref 30.0–36.0)
MCV: 94.7 fL (ref 78.0–100.0)
MCV: 96 fL (ref 78.0–100.0)
Platelets: 133 10*3/uL — ABNORMAL LOW (ref 150–400)
Platelets: 152 10*3/uL (ref 150–400)
RBC: 3.19 MIL/uL — ABNORMAL LOW (ref 4.22–5.81)
RBC: 3.27 MIL/uL — ABNORMAL LOW (ref 4.22–5.81)
RDW: 13.5 % (ref 11.5–15.5)
RDW: 13.7 % (ref 11.5–15.5)
WBC: 10.9 10*3/uL — ABNORMAL HIGH (ref 4.0–10.5)
WBC: 14 10*3/uL — ABNORMAL HIGH (ref 4.0–10.5)

## 2015-07-07 LAB — BASIC METABOLIC PANEL
Anion gap: 8 (ref 5–15)
BUN: 15 mg/dL (ref 6–20)
CO2: 24 mmol/L (ref 22–32)
Calcium: 8 mg/dL — ABNORMAL LOW (ref 8.9–10.3)
Chloride: 105 mmol/L (ref 101–111)
Creatinine, Ser: 1 mg/dL (ref 0.61–1.24)
GFR calc Af Amer: >60 mL/min (ref >60)
GFR calc non Af Amer: >60 mL/min (ref >60)
Glucose, Bld: 111 mg/dL — ABNORMAL HIGH (ref 65–99)
Potassium: 4 mmol/L (ref 3.5–5.1)
Sodium: 137 mmol/L (ref 135–145)

## 2015-07-07 LAB — MAGNESIUM
Magnesium: 2.2 mg/dL (ref 1.7–2.4)
Magnesium: 2 mg/dL (ref 1.7–2.4)

## 2015-07-07 MED ORDER — METOCLOPRAMIDE HCL 5 MG/ML IJ SOLN
10.0000 mg | Freq: Four times a day (QID) | INTRAMUSCULAR | Status: AC
  Administered 2015-07-07 – 2015-07-08 (×4): 10 mg via INTRAVENOUS
  Filled 2015-07-07 (×4): qty 2

## 2015-07-07 MED ORDER — FUROSEMIDE 10 MG/ML IJ SOLN
20.0000 mg | Freq: Once | INTRAMUSCULAR | Status: AC
  Administered 2015-07-07: 20 mg via INTRAVENOUS
  Filled 2015-07-07: qty 2

## 2015-07-07 MED ORDER — INSULIN ASPART 100 UNIT/ML ~~LOC~~ SOLN
0.0000 [IU] | SUBCUTANEOUS | Status: DC
  Administered 2015-07-07: 4 [IU] via SUBCUTANEOUS
  Administered 2015-07-07 – 2015-07-08 (×2): 2 [IU] via SUBCUTANEOUS

## 2015-07-07 MED FILL — Sodium Bicarbonate IV Soln 8.4%: INTRAVENOUS | Qty: 50 | Status: AC

## 2015-07-07 MED FILL — Lidocaine HCl IV Inj 20 MG/ML: INTRAVENOUS | Qty: 5 | Status: AC

## 2015-07-07 MED FILL — Sodium Chloride IV Soln 0.9%: INTRAVENOUS | Qty: 2000 | Status: AC

## 2015-07-07 MED FILL — Electrolyte-R (PH 7.4) Solution: INTRAVENOUS | Qty: 4000 | Status: AC

## 2015-07-07 MED FILL — Mannitol IV Soln 20%: INTRAVENOUS | Qty: 500 | Status: AC

## 2015-07-07 MED FILL — Heparin Sodium (Porcine) Inj 1000 Unit/ML: INTRAMUSCULAR | Qty: 20 | Status: AC

## 2015-07-07 NOTE — Anesthesia Postprocedure Evaluation (Signed)
Anesthesia Post Note  Patient: [name]  Procedure(s) Performed: Procedure(s) (LRB): CORONARY ARTERY BYPASS GRAFTING (CABG)x 4   utilizing the left internal mammary artery and endoscopically harvested bilateral  sapheneous vein. (N/A) TRANSESOPHAGEAL ECHOCARDIOGRAM (TEE) (N/A)  Patient location during evaluation: SICU Anesthesia Type: General Level of consciousness: awake and alert Pain management: pain level controlled Vital Signs Assessment: post-procedure vital signs reviewed and stable Respiratory status: spontaneous breathing, nonlabored ventilation, respiratory function stable and patient connected to nasal cannula oxygen Cardiovascular status: blood pressure returned to baseline and stable Postop Assessment: no signs of nausea or vomiting Anesthetic complications: no Comments: Extubated yesterday afternoon. Doing well.    Last Vitals:  Filed Vitals:   07/07/15 0600 07/07/15 0700  BP: 98/55 119/67  Pulse: 89 93  Temp: 36.9 C 36.9 C  Resp: 12 15    Last Pain:  Filed Vitals:   07/07/15 0720  PainSc: 3                  Catalina Gravel

## 2015-07-07 NOTE — Care Management Note (Signed)
Case Management Note  Patient Details  Name: COLIE CASTOR MRN: [id number] Date of Birth: 1934/01/28  Subjective/Objective:       Talked with wife who he lives with in [place].  Prior to admission independent.  No DME.  Plan to go home with wife who will be with him 24/7 on discharge.              Action/Plan:   Expected Discharge Date:                  Expected Discharge Plan:  [place]  In-House Referral:     Discharge planning Services  CM Consult  Post Acute Care Choice:    Choice offered to:     DME Arranged:    DME Agency:     HH Arranged:    [place] Choice Agency:     Status of Service:  In process, will continue to follow  Medicare Important Message Given:  Yes Date Medicare IM Given:    Medicare IM give by:    Date Additional Medicare IM Given:    Additional Medicare Important Message give by:     If discussed at [place] of Stay Meetings, dates discussed:    Additional Comments:  Vergie Living, RN 07/07/2015, 10:37 AM

## 2015-07-07 NOTE — Progress Notes (Signed)
1 Day Post-Op Procedure(s) (LRB): CORONARY ARTERY BYPASS GRAFTING (CABG)x 4   utilizing the left internal mammary artery and endoscopically harvested bilateral  sapheneous vein. (N/A) TRANSESOPHAGEAL ECHOCARDIOGRAM (TEE) (N/A) Subjective: Stable after CABG Minimal surgical pain  Objective: Vital signs in last 24 hours: Temp:  [96.3 F (35.7 C)-99 F (37.2 C)] 98.4 F (36.9 C) (03/07 0700) Pulse Rate:  [73-93] 93 (03/07 0700) Cardiac Rhythm:  [-] Normal sinus rhythm;Heart block (03/06 2100) Resp:  [10-16] 15 (03/07 0700) BP: (84-119)/(52-78) 119/67 mmHg (03/07 0700) SpO2:  [97 %-100 %] 100 % (03/07 0700) FiO2 (%):  [40 %-50 %] 40 % (03/06 1816) Weight:  [183 lb 3.2 oz (83.099 kg)] 183 lb 3.2 oz (83.099 kg) (03/07 0500)  Hemodynamic parameters for last 24 hours: PAP: (20-35)/(4-20) 33/17 mmHg CO:  [3 L/min-6.4 L/min] 6.4 L/min CI:  [1.4 L/min/m2-3.1 L/min/m2] 3.1 L/min/m2  Intake/Output from previous day: 03/06 0701 - 03/07 0700 In: 2103.6 [P.O.:200; I.V.:568.6; Blood:455; NG/GT:30; IV Piggyback:850] Out: [id number] [Urine:3040; Blood:1625; Chest Tube:450] Intake/Output this shift:         Exam    General- alert and comfortable   Lungs- clear without rales, wheezes   Cor- regular rate and rhythm, no murmur , gallop   Abdomen- soft, non-tender   Extremities - warm, non-tender, minimal edema   Neuro- oriented, appropriate, no focal weakness   Lab Results:  Recent Labs  07/06/15 2104 07/07/15 0415  WBC 10.3 10.9*  HGB 9.8* 9.8*  HCT 29.8* 30.2*  PLT 131* 133*   BMET:  Recent Labs  07/06/15 0357  07/06/15 2059 07/06/15 2104 07/07/15 0415  NA 142  < > 139  --  137  K 4.5  < > 4.3  --  4.0  CL 104  < > 101  --  105  CO2 28  --   --   --  24  GLUCOSE 120*  < > 164*  --  111*  BUN 23*  < > 19  --  15  CREATININE 1.39*  < > 0.90 1.08 1.00  CALCIUM 9.2  --   --   --  8.0*  < > = values in this interval not displayed.  PT/INR:  Recent Labs  07/06/15 1420   LABPROT 17.7*  INR 1.45   ABG    Component Value Date/Time   PHART 7.318* 07/06/2015 2104   HCO3 24.2* 07/06/2015 2104   TCO2 26 07/06/2015 2104   ACIDBASEDEF 2.0 07/06/2015 2104   O2SAT 98.0 07/06/2015 2104   CBG (last 3)   Recent Labs  07/07/15 0502 07/07/15 0604 07/07/15 0656  GLUCAP 114* 116* 65    Assessment/Plan: S/P Procedure(s) (LRB): CORONARY ARTERY BYPASS GRAFTING (CABG)x 4   utilizing the left internal mammary artery and endoscopically harvested bilateral  sapheneous vein. (N/A) TRANSESOPHAGEAL ECHOCARDIOGRAM (TEE) (N/A) Mobilize Diuresis Diabetes control d/c tubes/lines See progression orders   LOS: 6 days    [name] 07/07/2015

## 2015-07-07 NOTE — Op Note (Signed)
NAME:  [name], [name] NO.:  [id number]  MEDICAL RECORD NO.:  [id number]  LOCATION:  2S10C                        FACILITY:  [facility]  PHYSICIAN:  Ivin Poot, M.D.  DATE OF BIRTH:  [birth year]  DATE OF PROCEDURE:  07/06/2015 DATE OF DISCHARGE:                              OPERATIVE REPORT   OPERATION: 1. Coronary artery bypass grafting x4 (left internal mammary artery to     left anterior descending coronary artery, saphenous vein graft to     diagonal, saphenous vein graft to obtuse marginal, saphenous vein     graft to distal right coronary artery). 2. Endoscopic harvest of bilateral greater saphenous vein.  SURGEON:  Ivin Poot, MD  ASSISTANT:  Ellwood Handler, PA-C  ANESTHESIA:  General by Dr. Kerry Hough.  PREOPERATIVE DIAGNOSIS:  Severe multivessel coronary artery disease, unstable angina, non-ST-elevation myocardial infarction.  POSTOPERATIVE DIAGNOSIS:  Severe multivessel coronary artery disease, unstable angina, non-ST-elevation myocardial infarction.  CLINICAL NOTE:  The patient is an 82 year old gentleman who presented with chest pain and positive cardiac enzymes.  Cardiac catheterization demonstrated severe coronary disease with high-grade 90% to 95% stenosis of the LAD, 80% to 90% stenosis of the circumflex, and 75 to 85% stenosis of the right coronary artery as well as stenosis of the diagonal branch and LAD.  Surgical revascularization was recommended. The patient's LV function was fairly normal.  Prior to surgery, I discussed the indications, benefits, alternatives, and risks for treatment of CAD.  I discussed the risks, including risk of stroke, MI, bleeding, blood transfusion requirement, postoperative infection, postoperative pulmonary problems including pleural effusion and death. The patient demonstrated his understanding and agreed to proceed with surgery under what I felt was an informed consent.  OPERATIVE FINDINGS: 1. Very  difficult severely diseased bilateral saphenous vein with     areas of sclerosis and thickening which required multiple fine 7-0     repair sutures.  Vein was harvested on both legs and the best     portions of the veins were used.  I considered using CryoVein as a     substitute; however the veins although small appeared to have     adequate flow of the heparin and saline flush and I felt that the     native veins would give the patient a better long term result in     terms of graft patency. 2. Preserved LV function after separation from cardiopulmonary bypass. 3. No blood products required for the surgery.  OPERATIVE PROCEDURE:  The patient was brought to the operating room, placed supine on the operating table.  General anesthesia was induced under invasive hemodynamic monitoring.  The chest, abdomen, and legs were prepped with Betadine and draped as a sterile field.  A proper time- out was performed.  A sternal incision was made as the saphenous vein was harvested endoscopically from both legs, although the quality of the vein was poor with small segments and thickened vein in other areas. The internal mammary artery was harvested and this had excellent flow and was a good vessel.  The sternal retractor was placed.  The pericardium opened and suspended.  Heparin was administered and pursestrings were placed in  the ascending aorta and right atrium.  The patient was cannulated and placed on cardiopulmonary bypass.  The coronaries were identified for grafting.  I had layer of epicardial fat which made identification of coronaries challenging.  Cardioplegic cannulas were placed both antegrade and retrograde cold blood cardioplegia and the patient was cooled to 32 degrees.  The aortic crossclamp was applied and a liter of cold blood cardioplegia was delivered in split doses between the antegrade aortic and retrograde coronary sinus catheters.  There was good cardioplegic arrest and  septal temperature dropped less than 14 degrees.  Cardioplegia was delivered every 20 minutes or less.  The distal coronary anastomoses were performed.  The first distal anastomosis was to the right coronary prior to the bifurcation.  This had a proximal 80% stenosis.  A suboptimal vein, both with adequate flow was sewn end-to-side with running 7-0 Prolene.  There was adequate flow through the graft.  Second distal anastomosis was the circumflex marginal.  This was intramyocardial and was a diseased vessel 1.5 mm.  A reverse saphenous vein was sewn end-to-side with running 7-0 Prolene with adequate flow through the graft.  The third distal anastomosis was the diagonal branch to the LAD.  This was a smaller 1.2-mm vessel proximal 80% stenosis of the reverse saphenous vein.  A small diameter was sewn end-to-side with running 7-0 Prolene with good flow through the graft.  Cardioplegia was redosed.  The fourth distal anastomosis was the distal third of the LAD.  This was a 1.5-mm vessel proximal 95% stenosis.  The left IMA was brought through an opening in the left lateral pericardium, was brought onto the LAD and sewn end-to-side with running 8-0 Prolene.  There was excellent flow through the anastomosis after briefly releasing the pedicle bulldog on the mammary artery.  The bulldog was reapplied and the pedicle was secured to epicardium.  Cardioplegia was redosed.  While the cross-clamp was still in place, three proximal vein anastomoses were performed on the ascending aorta using a 4.0 mm punch running 6-0 Prolene.  Prior to tying down the final proximal anastomosis, air was vented from the coronaries with a dose of retrograde warm blood cardioplegia and the crossclamp was removed.  The heart resumed a spontaneous rhythm.  The vein grafts were de-aired and opened.  Each had good flow and hemostasis was documented at the proximal distal anastomoses.  The cardioplegia lines were  removed. Temporary pacing wires were applied.  The patient was rewarmed and reperfused and the ventilator was resumed.  The patient was weaned off cardiopulmonary bypass without difficulty after adequate reperfusion.  After separation from cardiopulmonary bypass, the heart appeared vigorous and the echo showed good LV function without regional wall motion abnormalities.  Protamine was administered without adverse reaction.  The cannulas were removed.  The mediastinum was irrigated. Superior pericardial fat was closed over the aorta.  Anterior mediastinal and left pleural chest tubes were placed and then brought out through separate incisions.  The sternum was closed with wire.  The pectoralis fascia was closed with a running #1 Vicryl.  The subcutaneous and skin layers were closed in running Vicryl and sterile dressings were applied.  Total cardiopulmonary bypass time was 145 minutes.     Ivin Poot, M.D.     PV/MEDQ  D:  07/07/2015  T:  07/07/2015  Job:  [id number]

## 2015-07-07 NOTE — Progress Notes (Signed)
[facility] BRIEF SICU PROGRESS NOTE  1 Day Post-Op  S/P Procedure(s) (LRB): CORONARY ARTERY BYPASS GRAFTING (CABG)x 4   utilizing the left internal mammary artery and endoscopically harvested bilateral  sapheneous vein. (N/A) TRANSESOPHAGEAL ECHOCARDIOGRAM (TEE) (N/A)   Stable day NSR w/ stable BP O2 sats 95-98% on 2 L/min UOP 30-50 mL/hr Labs okay  Plan: Continue current plan  Rexene Alberts, MD 07/07/2015 7:14 PM

## 2015-07-07 NOTE — Care Management Important Message (Signed)
Important Message  Patient Details  Name: [name] MRN: [id number] Date of Birth: Sep 23, 1933   Medicare Important Message Given:  Yes    Loann Quill 07/07/2015, 8:25 AM

## 2015-07-08 ENCOUNTER — Inpatient Hospital Stay (HOSPITAL_COMMUNITY): Payer: Medicare Other

## 2015-07-08 LAB — GLUCOSE, CAPILLARY
Glucose-Capillary: 114 mg/dL — ABNORMAL HIGH (ref 65–99)
Glucose-Capillary: 128 mg/dL — ABNORMAL HIGH (ref 65–99)
Glucose-Capillary: 136 mg/dL — ABNORMAL HIGH (ref 65–99)
Glucose-Capillary: 136 mg/dL — ABNORMAL HIGH (ref 65–99)
Glucose-Capillary: 149 mg/dL — ABNORMAL HIGH (ref 65–99)
Glucose-Capillary: 166 mg/dL — ABNORMAL HIGH (ref 65–99)

## 2015-07-08 LAB — BASIC METABOLIC PANEL
Anion gap: 8 (ref 5–15)
BUN: 18 mg/dL (ref 6–20)
CO2: 26 mmol/L (ref 22–32)
Calcium: 8.3 mg/dL — ABNORMAL LOW (ref 8.9–10.3)
Chloride: 102 mmol/L (ref 101–111)
Creatinine, Ser: 1.3 mg/dL — ABNORMAL HIGH (ref 0.61–1.24)
GFR calc Af Amer: 58 mL/min — ABNORMAL LOW (ref >60)
GFR calc non Af Amer: 50 mL/min — ABNORMAL LOW (ref >60)
Glucose, Bld: 140 mg/dL — ABNORMAL HIGH (ref 65–99)
Potassium: 3.8 mmol/L (ref 3.5–5.1)
Sodium: 136 mmol/L (ref 135–145)

## 2015-07-08 LAB — CBC
HCT: 29.3 % — ABNORMAL LOW (ref 39.0–52.0)
Hemoglobin: 9.3 g/dL — ABNORMAL LOW (ref 13.0–17.0)
MCH: 30.5 pg (ref 26.0–34.0)
MCHC: 31.7 g/dL (ref 30.0–36.0)
MCV: 96.1 fL (ref 78.0–100.0)
Platelets: 134 10*3/uL — ABNORMAL LOW (ref 150–400)
RBC: 3.05 MIL/uL — ABNORMAL LOW (ref 4.22–5.81)
RDW: 13.9 % (ref 11.5–15.5)
WBC: 12.3 10*3/uL — ABNORMAL HIGH (ref 4.0–10.5)

## 2015-07-08 MED ORDER — INSULIN ASPART 100 UNIT/ML ~~LOC~~ SOLN
0.0000 [IU] | Freq: Three times a day (TID) | SUBCUTANEOUS | Status: DC
  Administered 2015-07-08 – 2015-07-09 (×3): 2 [IU] via SUBCUTANEOUS

## 2015-07-08 MED ORDER — MAGNESIUM HYDROXIDE 400 MG/5ML PO SUSP
30.0000 mL | Freq: Every day | ORAL | Status: DC | PRN
  Administered 2015-07-09: 30 mL via ORAL
  Filled 2015-07-08: qty 30

## 2015-07-08 MED ORDER — ALUM & MAG HYDROXIDE-SIMETH 200-200-20 MG/5ML PO SUSP: 15.0000 mL | ORAL | Status: DC | PRN

## 2015-07-08 MED ORDER — SODIUM CHLORIDE 0.9% FLUSH
3.0000 mL | INTRAVENOUS | Status: DC | PRN
  Administered 2015-07-10: 3 mL via INTRAVENOUS
  Filled 2015-07-08: qty 3

## 2015-07-08 MED ORDER — SODIUM CHLORIDE 0.9% FLUSH
3.0000 mL | Freq: Two times a day (BID) | INTRAVENOUS | Status: DC
  Administered 2015-07-08 – 2015-07-10 (×4): 3 mL via INTRAVENOUS

## 2015-07-08 MED ORDER — SODIUM CHLORIDE 0.9 % IV SOLN: 250.0000 mL | INTRAVENOUS | Status: DC | PRN

## 2015-07-08 MED ORDER — THIAMINE HCL 100 MG/ML IJ SOLN
100.0000 mg | Freq: Every day | INTRAMUSCULAR | Status: DC
Start: 2015-07-08 — End: 2015-07-10
  Administered 2015-07-08 – 2015-07-10 (×3): 100 mg via INTRAVENOUS
  Filled 2015-07-08 (×3): qty 2

## 2015-07-08 MED ORDER — CLOPIDOGREL BISULFATE 75 MG PO TABS
75.0000 mg | ORAL_TABLET | Freq: Every day | ORAL | Status: DC
  Administered 2015-07-08 – 2015-07-11 (×4): 75 mg via ORAL
  Filled 2015-07-08 (×4): qty 1

## 2015-07-08 MED ORDER — FUROSEMIDE 40 MG PO TABS
40.0000 mg | ORAL_TABLET | Freq: Every day | ORAL | Status: DC
  Administered 2015-07-09 – 2015-07-11 (×3): 40 mg via ORAL
  Filled 2015-07-08 (×3): qty 1

## 2015-07-08 MED ORDER — MOVING RIGHT ALONG BOOK
Freq: Once | Status: DC
  Filled 2015-07-08 (×2): qty 1

## 2015-07-08 MED ORDER — ASPIRIN EC 81 MG PO TBEC
81.0000 mg | DELAYED_RELEASE_TABLET | Freq: Every day | ORAL | Status: DC
  Administered 2015-07-08 – 2015-07-11 (×4): 81 mg via ORAL
  Filled 2015-07-08 (×4): qty 1

## 2015-07-08 MED ORDER — FUROSEMIDE 10 MG/ML IJ SOLN
20.0000 mg | Freq: Once | INTRAMUSCULAR | Status: AC
  Administered 2015-07-08: 20 mg via INTRAVENOUS
  Filled 2015-07-08: qty 2

## 2015-07-08 NOTE — Progress Notes (Signed)
2 Days Post-Op Procedure(s) (LRB): CORONARY ARTERY BYPASS GRAFTING (CABG)x 4   utilizing the left internal mammary artery and endoscopically harvested bilateral  sapheneous vein. (N/A) TRANSESOPHAGEAL ECHOCARDIOGRAM (TEE) (N/A) Subjective: Continues to progress after CABG nsr  Objective: Vital signs in last 24 hours: Temp:  [98.2 F (36.8 C)-98.4 F (36.9 C)] 98.3 F (36.8 C) (03/08 0700) Pulse Rate:  [79-100] 90 (03/08 0800) Cardiac Rhythm:  [-] Normal sinus rhythm (03/08 0800) Resp:  [9-21] 12 (03/08 0800) BP: (91-127)/(46-73) 106/50 mmHg (03/08 0800) SpO2:  [88 %-100 %] 94 % (03/08 0800) Weight:  [199 lb 4.7 oz (90.4 kg)] 199 lb 4.7 oz (90.4 kg) (03/08 0500)  Hemodynamic parameters for last 24 hours: PAP: (33)/(21) 33/21 mmHg CO:  [5.7 L/min] 5.7 L/min CI:  [2.8 L/min/m2] 2.8 L/min/m2  Intake/Output from previous day: 03/07 0701 - 03/08 0700 In: 1125.5 [P.O.:480; I.V.:495.5; IV Piggyback:150] Out: 1380 [Urine:1260; Chest Tube:120] Intake/Output this shift: Total I/O In: 10 [I.V.:10] Out: -            Exam    General- alert and comfortable   Lungs- clear without rales, wheezes   Cor- regular rate and rhythm, no murmur , gallop   Abdomen- soft, non-tender   Extremities - warm, non-tender, minimal edema   Neuro- oriented, appropriate, no focal weakness   Lab Results:  Recent Labs  07/07/15 1630 07/08/15 0435  WBC 14.0* 12.3*  HGB 10.5* 9.3*  HCT 31.4* 29.3*  PLT 152 134*   BMET:  Recent Labs  07/07/15 0415 07/07/15 1625 07/07/15 1630 07/08/15 0435  NA 137 135  --  136  K 4.0 4.1  --  3.8  CL 105 99*  --  102  CO2 24  --   --  26  GLUCOSE 111* 154*  --  140*  BUN 15 19  --  18  CREATININE 1.00 1.00 1.16 1.30*  CALCIUM 8.0*  --   --  8.3*    PT/INR:  Recent Labs  07/06/15 1420  LABPROT 17.7*  INR 1.45   ABG    Component Value Date/Time   PHART 7.318* 07/06/2015 2104   HCO3 24.2* 07/06/2015 2104   TCO2 25 07/07/2015 1625   ACIDBASEDEF  2.0 07/06/2015 2104   O2SAT 98.0 07/06/2015 2104   CBG (last 3)   Recent Labs  07/07/15 1929 07/07/15 2357 07/08/15 0356  GLUCAP 162* 136* 114*    Assessment/Plan: S/P Procedure(s) (LRB): CORONARY ARTERY BYPASS GRAFTING (CABG)x 4   utilizing the left internal mammary artery and endoscopically harvested bilateral  sapheneous vein. (N/A) TRANSESOPHAGEAL ECHOCARDIOGRAM (TEE) (N/A) Mobilize Diuresis Plan for transfer to step-down: see transfer orders start plavix for poor SVG conduit - asa 81 mg   LOS: 7 days    [name] 07/08/2015

## 2015-07-08 NOTE — Progress Notes (Signed)
RT consult - Pt on [place] 0.5 Lpm sat 92%. Pt has Incentive Spirometer and is reaching 1261ml.

## 2015-07-08 NOTE — Progress Notes (Signed)
CARDIAC REHAB PHASE I   PRE:  Rate/Rhythm: 93 SR    BP: sitting 91/53    SaO2: 93 1/2L  MODE:  Ambulation: 150 ft   POST:  Rate/Rhythm: 102 ST    BP: sitting 115/57     SaO2: 92 2L  Pt weak and tired/groggy. Needed assist to stand from recliner (long legs). Wobbly walking with RW, assist x1, ? R/t pain meds. To bed, very exhausted. VSS. Will f/u tomorrow. [place], ACSM 07/08/2015 2:54 PM

## 2015-07-08 NOTE — Progress Notes (Signed)
Pt received from 2S RN. Oriented to room and equipment. Pt denies pain. Call light within reach.   Fritz Pickerel, RN

## 2015-07-09 ENCOUNTER — Inpatient Hospital Stay (HOSPITAL_COMMUNITY): Payer: Medicare Other

## 2015-07-09 LAB — CBC
HCT: 31.1 % — ABNORMAL LOW (ref 39.0–52.0)
Hemoglobin: 10.5 g/dL — ABNORMAL LOW (ref 13.0–17.0)
MCH: 32.7 pg (ref 26.0–34.0)
MCHC: 33.8 g/dL (ref 30.0–36.0)
MCV: 96.9 fL (ref 78.0–100.0)
Platelets: 167 10*3/uL (ref 150–400)
RBC: 3.21 MIL/uL — ABNORMAL LOW (ref 4.22–5.81)
RDW: 14 % (ref 11.5–15.5)
WBC: 15.2 10*3/uL — ABNORMAL HIGH (ref 4.0–10.5)

## 2015-07-09 LAB — TYPE AND SCREEN
ABO/RH(D): B NEG
Antibody Screen: NEGATIVE
Unit division: 0
Unit division: 0

## 2015-07-09 LAB — BASIC METABOLIC PANEL
Anion gap: 12 (ref 5–15)
BUN: 21 mg/dL — ABNORMAL HIGH (ref 6–20)
CO2: 24 mmol/L (ref 22–32)
Calcium: 8.5 mg/dL — ABNORMAL LOW (ref 8.9–10.3)
Chloride: 100 mmol/L — ABNORMAL LOW (ref 101–111)
Creatinine, Ser: 1.43 mg/dL — ABNORMAL HIGH (ref 0.61–1.24)
GFR calc Af Amer: 52 mL/min — ABNORMAL LOW (ref >60)
GFR calc non Af Amer: 45 mL/min — ABNORMAL LOW (ref >60)
Glucose, Bld: 142 mg/dL — ABNORMAL HIGH (ref 65–99)
Potassium: 3.5 mmol/L (ref 3.5–5.1)
Sodium: 136 mmol/L (ref 135–145)

## 2015-07-09 LAB — GLUCOSE, CAPILLARY: Glucose-Capillary: 126 mg/dL — ABNORMAL HIGH (ref 65–99)

## 2015-07-09 NOTE — Progress Notes (Addendum)
      [address]       [place] [address]             [phone]      3 Days Post-Op Procedure(s) (LRB): CORONARY ARTERY BYPASS GRAFTING (CABG)x 4   utilizing the left internal mammary artery and endoscopically harvested bilateral  sapheneous vein. (N/A) TRANSESOPHAGEAL ECHOCARDIOGRAM (TEE) (N/A)   Subjective:  [name] has no complaints.  He asks when he can go home, and states his wife will be providing care for him.   Objective: Vital signs in last 24 hours: Temp:  [97.9 F (36.6 C)-98.7 F (37.1 C)] 98.7 F (37.1 C) (03/09 0446) Pulse Rate:  [77-107] 96 (03/09 0446) Cardiac Rhythm:  [-] Normal sinus rhythm (03/08 2039) Resp:  [12-18] 16 (03/09 0446) BP: (96-115)/(50-63) 115/63 mmHg (03/09 0446) SpO2:  [91 %-98 %] 93 % (03/09 0446) Weight:  [199 lb 8 oz (90.493 kg)] 199 lb 8 oz (90.493 kg) (03/09 0258)  Intake/Output from previous day: 03/08 0701 - 03/09 0700 In: 493 [P.O.:480; I.V.:13] Out: 600 [Urine:600] Intake/Output this shift: Total I/O In: -  Out: 175 [Urine:175]  General appearance: alert, cooperative and no distress Heart: regular rate and rhythm Lungs: clear to auscultation bilaterally Abdomen: soft, non-tender; bowel sounds normal; no masses,  no organomegaly Extremities: edema trace Wound: clean and dry  Lab Results:  Recent Labs  07/08/15 0435 07/09/15 0313  WBC 12.3* 15.2*  HGB 9.3* 10.5*  HCT 29.3* 31.1*  PLT 134* 167   BMET:  Recent Labs  07/08/15 0435 07/09/15 0313  NA 136 136  K 3.8 3.5  CL 102 100*  CO2 26 24  GLUCOSE 140* 142*  BUN 18 21*  CREATININE 1.30* 1.43*  CALCIUM 8.3* 8.5*    PT/INR:  Recent Labs  07/06/15 1420  LABPROT 17.7*  INR 1.45   ABG    Component Value Date/Time   PHART 7.318* 07/06/2015 2104   HCO3 24.2* 07/06/2015 2104   TCO2 25 07/07/2015 1625   ACIDBASEDEF 2.0 07/06/2015 2104   O2SAT 98.0 07/06/2015 2104   CBG (last 3)   Recent Labs  07/08/15 1608 07/08/15 2215  07/09/15 0620  GLUCAP 149* 136* 126*    Assessment/Plan: S/P Procedure(s) (LRB): CORONARY ARTERY BYPASS GRAFTING (CABG)x 4   utilizing the left internal mammary artery and endoscopically harvested bilateral  sapheneous vein. (N/A) TRANSESOPHAGEAL ECHOCARDIOGRAM (TEE) (N/A)  1. CV- maintaining NSR- continue Lopressor, will d/c EPW 2. Pulm- wean oxygen as tolerated, continue IS- CXR small left pleural effusion 3. Renal- creatinine has been slowly rising, currently up to 1.43, + hypervolemia, on Lasix will continue today if creatinine rises again tomorrow will need to discontinue 4. CBGs have been controlled, will d/c SSIP and cbgs as patient is not a diabetic 5. Dipso- patient stable, maintaining NSR will d/c EPW today...Marland Kitchen Awaiting PT recs, patient states wife to provide care, however he may benefit from short term SNF   LOS: 8 days    [name] 07/09/2015  Would expect him to remain in-house until sun then home with HHN. follow rhythm and creat Short term plavix for sub optimal conduit, target vessels patient examined and medical record reviewed,agree with above note. [name] 07/09/2015

## 2015-07-09 NOTE — Discharge Summary (Signed)
Physician Discharge Summary  Patient ID: [name] MRN: [id number] DOB/AGE: [birth year] 82 y.o.  Admit date: 07/01/2015 Discharge date: 07/11/2015  Admission Diagnoses:  Patient Active Problem List   Diagnosis Date Noted  . 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 Unstable angina ([place]) 07/01/2015  . NSTEMI (non-ST elevated myocardial infarction) ([place]) 07/01/2015  . BMI 25.0-25.9,adult 02/02/2015  . PVD (peripheral vascular disease) ([place]) 01/01/2014  . Encounter for long-term (current) use of medications 10/05/2013  . Hyperlipidemia 04/11/2013  . Prediabetes 04/11/2013  . Hypertension   . Vitamin D deficiency   . Vitamin B 12 deficiency   . Coronary atherosclerosis- s/p PCI to LAD in 2009 and PCI to RCA in 2011 02/27/2009  . Abdominal aortic aneurysm ([place]) 02/27/2009  . GERD 02/27/2009  . History of IBS 02/27/2009   Discharge Diagnoses:   Patient Active Problem List   Diagnosis Date Noted  . 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 Unstable angina ([place]) 07/01/2015  . NSTEMI (non-ST elevated myocardial infarction) ([place]) 07/01/2015  . BMI 25.0-25.9,adult 02/02/2015  . PVD (peripheral vascular disease) ([place]) 01/01/2014  . Encounter for long-term (current) use of medications 10/05/2013  . Hyperlipidemia 04/11/2013  . Prediabetes 04/11/2013  . Hypertension   . Vitamin D deficiency   . Vitamin B 12 deficiency   . Coronary atherosclerosis- s/p PCI to LAD in 2009 and PCI to RCA in 2011 02/27/2009  . Abdominal aortic aneurysm ([place]) 02/27/2009  . GERD 02/27/2009  . History of IBS 02/27/2009   Discharged Condition: good  History of Present Illness:  [name] is an 82 yo white male with known history of CAD S/P PCI to LAD and RCA, HTN, and Hyperlipidemia.  He presented to the ED with complaints of resting chest pain with radiation to both shoulders and both jaws..  Workup in the ED revealed mild Troponin elevation.  He was chest pain free in the ED and was admitted by Cardiology for further care.   He underwent cardiac catheterization which showed multivessel CAD and it was felt coronary bypass grafting would be indicated.  TCTS consult was obtained and Dr. Prescott Gum evaluated the patient and was in agreement the patient would benefit from Coronary bypass grafting.  The risks and benefits of the procedure were explained to the patient and he was agreeable to proceed.  Hospital Course:   He remained chest pain free during his hospitalization.  He was taken to the operating room and underwent CABG x 4 utilizing LIMA to LAD, SVG to Diagonal, SVG to OM, and SVG to distal RCA.  He also underwent bilateral endoscopic harvest of greater saphenous vein from right leg and left thigh.  He tolerated the procedure without difficulty and he was taken to the SICU in stable condition.  He was extubated the evening of surgery.  During his stay in the SICU the patient progressed without difficulty.  His chest tubes and arterial lines were removed without difficulty. He was maintaining NSR.  He was ambulating with assistance.  He was felt medically stable for transfer to the step down unit on POD #2.  The patient continues to make progress.  He continues to maintain NSR and his pacing wires were removed without difficulty.  His creatinine has been trending upward and peaked at 1.43 before trending back down.  Physical therapy consult was obtained and they felt patient was improving but would benefit from HHPT.  He continues to ambulate with assistance.  He is tolerating a heart healthy diet.  he is felt medically stable for discharge today.    Significant Diagnostic Studies: cardiac graphics: Cath lab   Prox RCA to Mid RCA lesion, 60% stenosed. The lesion was previously treated with a stent (unknown type).  Ost LAD lesion, 70% stenosed.  Ost Cx to Prox Cx lesion, 60% stenosed.  Prox LAD to Mid LAD lesion, 10% stenosed. The lesion was previously treated with a stent (unknown type) greater than two years  ago.  Mid LAD lesion, 99% stenosed.  Ost 2nd Diag to 2nd Diag lesion, 70% stenosed.  The left ventricular systolic function is normal.                 Treatments: surgery:   1. Coronary artery bypass grafting x4 (left internal mammary artery to left anterior descending coronary artery, saphenous vein graft to diagonal, saphenous vein graft to obtuse marginal, saphenous vein graft to distal right coronary artery). 2. Endoscopic harvest of bilateral greater saphenous vein.  Disposition: 01-Home or Self Care   Discharge Medications:  The patient has been discharged on:   1.Beta Blocker:  Yes [ x  ]                              No   [   ]                              If No, reason:  2.Ace Inhibitor/ARB: Yes [   ]                                     No  [ x   ]                                     If No, reason: elevated creatinine, labile BP  3.Statin:   Yes [ x  ]                  No  [   ]                  If No, reason:  4.Ecasa:  Yes  [ x  ]                  No   [   ]                  If No, reason:        Medication List    STOP taking these medications        aspirin 325 MG tablet  Replaced by:  aspirin 81 MG EC tablet     atenolol 25 MG tablet  Commonly known as:  TENORMIN     atenolol 50 MG tablet  Commonly known as:  TENORMIN     nitroGLYCERIN 0.4 MG SL tablet  Commonly known as:  NITROSTAT      TAKE these medications        aspirin 81 MG EC tablet  Take 1 tablet (81 mg total) by mouth daily.     calcium carbonate 500 MG chewable tablet  Commonly known as:  TUMS - dosed in mg elemental calcium  Chew 2 tablets by mouth daily.  CALCIUM PO  Take 1 tablet by mouth daily.     clopidogrel 75 MG tablet  Commonly known as:  PLAVIX  Take 1 tablet (75 mg total) by mouth daily.     Flaxseed (Linseed) 1000 MG Caps  Take 1,000 mg by mouth daily.     gabapentin 300 MG capsule  Commonly known as:  NEURONTIN  TAKE ONE CAPSULE BY MOUTH THREE TIMES  DAILY FOR NEURITIS PAIN     glucose blood test strip  Commonly known as:  ONE TOUCH ULTRA TEST  Check glucose 1 time daily.  [id number]-R73.09     Magnesium 250 MG Tabs  Take 250 mg by mouth daily.     metoprolol tartrate 25 MG tablet  Commonly known as:  LOPRESSOR  Take 1 tablet (25 mg total) by mouth 2 (two) times daily.     multivitamin per tablet  Take 1 tablet by mouth daily.     omeprazole 20 MG capsule  Commonly known as:  PRILOSEC  TAKE 2 CAPSULES BY MOUTH EVERY DAY FOR INDIGESTION     pravastatin 40 MG tablet  Commonly known as:  PRAVACHOL  Take 1 tablet (40 mg total) by mouth daily at 6 PM.       Follow-up Information    Follow up with Ivin Poot III, MD On 08/05/2015.   Specialty:  Cardiothoracic Surgery   Why:  Appointment is at 12:30   Contact information:   [address] [place] [address] [phone]       Follow up with [facility] On 08/05/2015.   Why:  Please get CXR at 12:00   Contact information:   [facility]       Follow up with Erlene Quan, PA-C On 07/24/2015.   Specialties:  Cardiology, Radiology   Why:  Appointment is at 3:00   Contact information:   [address] [place] [address] [phone]       Signed: John Giovanni 07/11/2015, 8:17 AM

## 2015-07-09 NOTE — Progress Notes (Signed)
Pt wires removed. 1 hr bed rest completed. Pt tolerated well. V/S stable.   Call light within reach. Will continue to monitor.  Fritz Pickerel, RN

## 2015-07-09 NOTE — Discharge Instructions (Signed)
Coronary Artery Bypass Grafting, Care After °Refer to this sheet in the next few weeks. These instructions provide you with information on caring for yourself after your procedure. Your health care provider may also give you more specific instructions. Your treatment has been planned according to current medical practices, but problems sometimes occur. Call your health care provider if you have any problems or questions after your procedure. °WHAT TO EXPECT AFTER THE PROCEDURE °Recovery from surgery will be different for everyone. Some people feel well after 3 or 4 weeks, while for others it takes longer. After your procedure, it is typical to have the following: °· Nausea and a lack of appetite.   °· Constipation. °· Weakness and fatigue.   °· Depression or irritability.   °· Pain or discomfort at your incision site. °HOME CARE INSTRUCTIONS °· Take medicines only as directed by your health care provider. Do not stop taking medicines or start any new medicines without first checking with your health care provider. °· Take your pulse as directed by your health care provider. °· Perform deep breathing as directed by your health care provider. If you were given a device called an incentive spirometer, use it to practice deep breathing several times a day. Support your chest with a pillow or your arms when you take deep breaths or cough. °· Keep incision areas clean, dry, and protected. Remove or change any bandages (dressings) only as directed by your health care provider. You may have skin adhesive strips over the incision areas. Do not take the strips off. They will fall off on their own. °· Check incision areas daily for any swelling, redness, or drainage. °· If incisions were made in your legs, do the following: °¨ Avoid crossing your legs.   °¨ Avoid sitting for long periods of time. Change positions every 30 minutes.   °¨ Elevate your legs when you are sitting. °· Wear compression stockings as directed by your  health care provider. These stockings help keep blood clots from forming in your legs. °· Take showers once your health care provider approves. Until then, only take sponge baths. Pat incisions dry. Do not rub incisions with a washcloth or towel. Do not take baths, swim, or use a hot tub until your health care provider approves. °· Eat foods that are high in fiber, such as raw fruits and vegetables, whole grains, beans, and nuts. Meats should be lean cut. Avoid canned, processed, and fried foods. °· Drink enough fluid to keep your urine clear or pale yellow. °· Weigh yourself every day. This helps identify if you are retaining fluid that may make your heart and lungs work harder. °· Rest and limit activity as directed by your health care provider. You may be instructed to: °¨ Stop any activity at once if you have chest pain, shortness of breath, irregular heartbeats, or dizziness. Get help right away if you have any of these symptoms. °¨ Move around frequently for short periods or take short walks as directed by your health care provider. Increase your activities gradually. You may need physical therapy or cardiac rehabilitation to help strengthen your muscles and build your endurance. °¨ Avoid lifting, pushing, or pulling anything heavier than 10 lb (4.5 kg) for at least 6 weeks after surgery. °· Do not drive until your health care provider approves.  °· Ask your health care provider when you may return to work. °· Ask your health care provider when you may resume sexual activity. °· Keep all follow-up visits as directed by your health care   provider. This is important. °SEEK MEDICAL CARE IF: °· You have swelling, redness, increasing pain, or drainage at the site of an incision. °· You have a fever. °· You have swelling in your ankles or legs. °· You have pain in your legs.   °· You gain 2 or more pounds (0.9 kg) a day. °· You are nauseous or vomit. °· You have diarrhea.  °SEEK IMMEDIATE MEDICAL CARE IF: °· You have  chest pain that goes to your jaw or arms. °· You have shortness of breath.   °· You have a fast or irregular heartbeat.   °· You notice a "clicking" in your breastbone (sternum) when you move.   °· You have numbness or weakness in your arms or legs. °· You feel dizzy or light-headed.   °MAKE SURE YOU: °· Understand these instructions. °· Will watch your condition. °· Will get help right away if you are not doing well or get worse. °  °This information is not intended to replace advice given to you by your health care provider. Make sure you discuss any questions you have with your health care provider. °  °Document Released: 11/05/2004 Document Revised: 05/09/2014 Document Reviewed: 09/25/2012 °Elsevier Interactive Patient Education ©2016 [employer] ° °Endoscopic Saphenous Vein Harvesting, Care After °Refer to this sheet in the next few weeks. These instructions provide you with information on caring for yourself after your procedure. Your health care provider may also give you more specific instructions. Your treatment has been planned according to current medical practices, but problems sometimes occur. Call your health care provider if you have any problems or questions after your procedure. °HOME CARE INSTRUCTIONS °Medicine °· Take whatever pain medicine your surgeon prescribes. Follow the directions carefully. Do not take over-the-counter pain medicine unless your surgeon says it is okay. Some pain medicine can cause bleeding problems for several weeks after surgery. °· Follow your surgeon's instructions about driving. You will probably not be permitted to drive after heart surgery. °· Take any medicines your surgeon prescribes. Any medicines you took before your heart surgery should be checked with your health care provider before you start taking them again. °Wound care °· If your surgeon has prescribed an elastic bandage or stocking, ask how long you should wear it. °· Check the area around your surgical  cuts (incisions) whenever your bandages (dressings) are changed. Look for any redness or swelling. °· You will need to return to have the stitches (sutures) or staples taken out. Ask your surgeon when to do that. °· Ask your surgeon when you can shower or bathe. °Activity °· Try to keep your legs raised when you are sitting. °· Do any exercises your health care providers have given you. These may include deep breathing exercises, coughing, walking, or other exercises. °SEEK MEDICAL CARE IF: °· You have any questions about your medicines. °· You have more leg pain, especially if your pain medicine stops working. °· New or growing bruises develop on your leg. °· Your leg swells, feels tight, or becomes red. °· You have numbness in your leg. °SEEK IMMEDIATE MEDICAL CARE IF: °· Your pain gets much worse. °· Blood or fluid leaks from any of the incisions. °· Your incisions become warm, swollen, or red. °· You have chest pain. °· You have trouble breathing. °· You have a fever. °· You have more pain near your leg incision. °MAKE SURE YOU: °· Understand these instructions. °· Will watch your condition. °· Will get help right away if you are not doing well or   get worse. °  °This information is not intended to replace advice given to you by your health care provider. Make sure you discuss any questions you have with your health care provider. °  °Document Released: 12/29/2010 Document Revised: 05/09/2014 Document Reviewed: 12/29/2010 °Elsevier Interactive Patient Education ©2016 Elsevier Inc. ° ° °

## 2015-07-09 NOTE — Progress Notes (Signed)
CARDIAC REHAB PHASE I   PRE:  Rate/Rhythm: 99 SR    BP: sitting 109/60    SaO2: 93 RA  MODE:  Ambulation: 350 ft   POST:  Rate/Rhythm: 120 ST    BP: sitting 130/60     SaO2: 90 RA  Pt stronger today. Stood with mod assist. Used RW, min assist. Slight unsteadiness at times, esp when backing in to recliner on return. Tired after walk, somewhat SOB. SaO2 low normal on RA, HR elevated today.  Pt will need RW for home use, he does not have one at home. [place], ACSM 07/09/2015 11:12 AM

## 2015-07-10 LAB — BASIC METABOLIC PANEL
Anion gap: 13 (ref 5–15)
BUN: 21 mg/dL — ABNORMAL HIGH (ref 6–20)
CO2: 24 mmol/L (ref 22–32)
Calcium: 8.3 mg/dL — ABNORMAL LOW (ref 8.9–10.3)
Chloride: 102 mmol/L (ref 101–111)
Creatinine, Ser: 1.28 mg/dL — ABNORMAL HIGH (ref 0.61–1.24)
GFR calc Af Amer: 59 mL/min — ABNORMAL LOW (ref >60)
GFR calc non Af Amer: 51 mL/min — ABNORMAL LOW (ref >60)
Glucose, Bld: 134 mg/dL — ABNORMAL HIGH (ref 65–99)
Potassium: 3.3 mmol/L — ABNORMAL LOW (ref 3.5–5.1)
Sodium: 139 mmol/L (ref 135–145)

## 2015-07-10 MED ORDER — METOPROLOL TARTRATE 25 MG PO TABS
25.0000 mg | ORAL_TABLET | Freq: Two times a day (BID) | ORAL | Status: DC
Start: 2015-07-10 — End: 2015-07-11
  Administered 2015-07-10 – 2015-07-11 (×3): 25 mg via ORAL
  Filled 2015-07-10 (×3): qty 1

## 2015-07-10 MED ORDER — VITAMIN B-1 100 MG PO TABS
100.0000 mg | ORAL_TABLET | Freq: Every day | ORAL | Status: DC
  Administered 2015-07-11: 100 mg via ORAL
  Filled 2015-07-10: qty 1

## 2015-07-10 NOTE — Progress Notes (Addendum)
      [address]       [place] [address]             [phone]      4 Days Post-Op Procedure(s) (LRB): CORONARY ARTERY BYPASS GRAFTING (CABG)x 4   utilizing the left internal mammary artery and endoscopically harvested bilateral  sapheneous vein. (N/A) TRANSESOPHAGEAL ECHOCARDIOGRAM (TEE) (N/A)   Subjective:  [name] has no complaints.  He is ambulating with assistance of walker.  Wife is at bedside and is hopeful to take him home tomorrow.  + BM  Objective: Vital signs in last 24 hours: Temp:  [97.7 F (36.5 C)-98.7 F (37.1 C)] 97.9 F (36.6 C) (03/10 0500) Pulse Rate:  [96-109] 100 (03/10 0500) Cardiac Rhythm:  [-] Normal sinus rhythm (03/09 2024) Resp:  [18-19] 18 (03/10 0500) BP: (106-137)/(61-73) 132/69 mmHg (03/10 0500) SpO2:  [93 %-98 %] 97 % (03/10 0500) Weight:  [189 lb 6.4 oz (85.911 kg)] 189 lb 6.4 oz (85.911 kg) (03/10 0521)  Intake/Output from previous day: 03/09 0701 - 03/10 0700 In: 480 [P.O.:480] Out: 1075 [Urine:1075]  General appearance: alert, cooperative and no distress Heart: regular rate and rhythm Lungs: clear to auscultation bilaterally Abdomen: soft, non-tender; bowel sounds normal; no masses,  no organomegaly Extremities: edema trace Wound: clean and dry  Lab Results:  Recent Labs  07/08/15 0435 07/09/15 0313  WBC 12.3* 15.2*  HGB 9.3* 10.5*  HCT 29.3* 31.1*  PLT 134* 167   BMET:  Recent Labs  07/09/15 0313 07/10/15 0300  NA 136 139  K 3.5 3.3*  CL 100* 102  CO2 24 24  GLUCOSE 142* 134*  BUN 21* 21*  CREATININE 1.43* 1.28*  CALCIUM 8.5* 8.3*    PT/INR: No results for input(s): LABPROT, INR in the last 72 hours. ABG    Component Value Date/Time   PHART 7.318* 07/06/2015 2104   HCO3 24.2* 07/06/2015 2104   TCO2 25 07/07/2015 1625   ACIDBASEDEF 2.0 07/06/2015 2104   O2SAT 98.0 07/06/2015 2104   CBG (last 3)   Recent Labs  07/08/15 1608 07/08/15 2215 07/09/15 0620  GLUCAP 149* 136* 126*     Assessment/Plan: S/P Procedure(s) (LRB): CORONARY ARTERY BYPASS GRAFTING (CABG)x 4   utilizing the left internal mammary artery and endoscopically harvested bilateral  sapheneous vein. (N/A) TRANSESOPHAGEAL ECHOCARDIOGRAM (TEE) (N/A)  1. CV- continues to maintain NSR, tachy, BP mildly elevated- will increase beta blocker 2. Pulm- no acute issues, off oxygen, continue IS 3. Renal- creatinine has stabilized, down to 1.28, weight continues to trend down, continue Lasix 4. Dispo- patient is stable, maintaining NSR, awaiting PT recs, will make H/H arrangements- plan for d/c tomorrow   LOS: 9 days    [name], [name] 07/10/2015   Patient seen and examined, no complaints Did steps with cardiac rehab Probably home in AM  [place] C. Roxan Hockey, MD [facility] [phone]

## 2015-07-10 NOTE — Care Management Important Message (Signed)
Important Message  Patient Details  Name: [name] MRN: [id number] Date of Birth: 1934/01/27   Medicare Important Message Given:  Yes    [name] Abena 07/10/2015, 11:48 AM

## 2015-07-10 NOTE — Evaluation (Signed)
Physical Therapy Evaluation Patient Details Name: [name] MRN: [id number] DOB: May 11, 1933 Today's Date: 07/10/2015   History of Present Illness  Patient is a 82 y/o male with hx of HTN, DM, Aneurysm of iliac artery and IBS present s/p CABG x4.  Clinical Impression  Patient presents with generalized weakness, decreased endurance, balance and overall mobility s/p above surgery. Educated pt on sternal precautions. Pt independent PTA. Requires Min A for bed mobility, transfers and ambulation as pt not able to use UEs due to precautions. Tolerated stair training with Min A for support. Pt has support from wife at d/c. Complained of "wooziness" during ambulation today and balance deficits noted. Will follow acutely to maximize independence and mobility prior to return home. Would benefit from stair training tomorrow prior to d/c.    Follow Up Recommendations Home health PT;Supervision/Assistance - 24 hour    Equipment Recommendations  Rolling walker with 5" wheels    Recommendations for Other Services OT consult     Precautions / Restrictions Precautions Precautions: Sternal;Fall Restrictions Weight Bearing Restrictions: Yes      Mobility  Bed Mobility Overal bed mobility: Needs Assistance Bed Mobility: Rolling;Sidelying to Sit;Sit to Sidelying Rolling: Supervision Sidelying to sit: Mod assist       General bed mobility comments: HOB flat, no use of rails to simulate home. Assist to elevate trunk. Cues for log roll technique.  Transfers Overall transfer level: Needs assistance Equipment used: Rolling walker (2 wheeled) Transfers: Sit to/from Stand Sit to Stand: Min assist         General transfer comment: Min A to boost from EOB x1 with cues for use of momentum to stand holding heart pillow.  Ambulation/Gait Ambulation/Gait assistance: Min assist Ambulation Distance (Feet): 200 Feet Assistive device: Rolling walker (2 wheeled) Gait Pattern/deviations:  Step-through pattern;Decreased stride length Gait velocity: decreased   General Gait Details: Pt with very slow gait with occasional posterior lean when advancing LE. Min A for balance. Reports "wooziness."  Stairs Stairs: Yes Stairs assistance: Min assist Stair Management: Two rails;Step to pattern Number of Stairs: 4 General stair comments: Cues for safety and technique. Min A for balance. 2/4 DOE.   Wheelchair Mobility    Modified Rankin (Stroke Patients Only)       Balance Overall balance assessment: Needs assistance Sitting-balance support: Feet supported;No upper extremity supported Sitting balance-Leahy Scale: Good     Standing balance support: During functional activity Standing balance-Leahy Scale: Fair Standing balance comment: Able to stand unsupported statically.                             Pertinent Vitals/Pain Pain Assessment: No/denies pain    Home Living Family/patient expects to be discharged to:: Private residence Living Arrangements: Spouse/significant other Available Help at Discharge: Family;Available 24 hours/day (wife has broken ankle in CAM boot.) Type of Home: House Home Access: Stairs to enter Entrance Stairs-Rails: Right Entrance Stairs-Number of Steps: 4 Home Layout: One level Home Equipment: None      Prior Function Level of Independence: Independent               Hand Dominance        Extremity/Trunk Assessment   Upper Extremity Assessment: Defer to OT evaluation           Lower Extremity Assessment: Generalized weakness         Communication   Communication: No difficulties  Cognition Arousal/Alertness: Awake/alert Behavior During Therapy: [facility] for  tasks assessed/performed Overall Cognitive Status: Within Functional Limits for tasks assessed                      General Comments General comments (skin integrity, edema, etc.): Wife and friend present in room during session.    Exercises         Assessment/Plan    PT Assessment Patient needs continued PT services  PT Diagnosis Difficulty walking   PT Problem List Decreased strength;Decreased balance;Decreased mobility;Decreased knowledge of precautions;Decreased activity tolerance;Cardiopulmonary status limiting activity  PT Treatment Interventions Balance training;Gait training;Stair training;Functional mobility training;Therapeutic activities;Therapeutic exercise;Patient/family education;DME instruction   PT Goals (Current goals can be found in the Care Plan section) Acute Rehab PT Goals Patient Stated Goal: to return home tomorrow PT Goal Formulation: With patient/family Time For Goal Achievement: 07/24/15 Potential to Achieve Goals: Good    Frequency Min 3X/week   Barriers to discharge Inaccessible home environment 4 steps to enter home    Co-evaluation               End of Session Equipment Utilized During Treatment: Gait belt Activity Tolerance: Patient tolerated treatment well Patient left: in bed;with call bell/phone within reach;with family/visitor present Nurse Communication: Mobility status         Time: [id number] PT Time Calculation (min) (ACUTE ONLY): 19 min   Charges:   PT Evaluation $PT Eval Moderate Complexity: 1 Procedure     PT G Codes:        Quida Glasser A Diedre Maclellan 07/10/2015, 3:38 PM  Wray Kearns, [place], DPT [phone]

## 2015-07-10 NOTE — Progress Notes (Signed)
CARDIAC REHAB PHASE I   PRE:  Rate/Rhythm: 89 SR     BP: sitting 117/62    SaO2: 91 RA  MODE:  Ambulation: 550 ft   POST:  Rate/Rhythm: 117 ST    BP: sitting 135/80     SaO2: 88 RA, up to 92 with rest and PLB  Pt stood with min assist. Used RW, no assist needed. Steady today. Sts he is tired. HR up to 117 ST and SAO2 low at 87-88 RA walking. Encouraged pursed lip breathing. Return to recliner. Pt inspriring [id number] mL on IS. Encouraged x5 every 30 min today. Ed completed with pt and wife. Voiced understanding and requests his referral be sent to [facility]. Needs to watch video as he has a visitor right now. [place], ACSM 07/10/2015 11:37 AM

## 2015-07-10 NOTE — Progress Notes (Signed)
PT Cancellation Note  Patient Details Name: TIMBER PESICKA MRN: [id number] DOB: [birth year]   Cancelled Treatment:    Reason Eval/Treat Not Completed: Patient at procedure or test/unavailable  Cardiac rehab working with pt at this time. Will follow up as time allows.  Marguarite Arbour A Jahniya Duzan 07/10/2015, 11:32 AM Wray Kearns, PT, DPT [phone]

## 2015-07-11 MED ORDER — PRAVASTATIN SODIUM 40 MG PO TABS
40.0000 mg | ORAL_TABLET | Freq: Every day | ORAL | Status: DC
Start: 2015-07-11 — End: 2015-09-07

## 2015-07-11 MED ORDER — ASPIRIN 81 MG PO TBEC: 81.0000 mg | DELAYED_RELEASE_TABLET | Freq: Every day | ORAL | Status: DC

## 2015-07-11 MED ORDER — CLOPIDOGREL BISULFATE 75 MG PO TABS: 75.0000 mg | ORAL_TABLET | Freq: Every day | ORAL | Status: DC

## 2015-07-11 MED ORDER — METOPROLOL TARTRATE 25 MG PO TABS
25.0000 mg | ORAL_TABLET | Freq: Two times a day (BID) | ORAL | Status: DC
Start: 2015-07-11 — End: 2015-07-20

## 2015-07-11 NOTE — Progress Notes (Signed)
Patient discharged home with wife. Bedside commode and walker were given to him. IV was dc'd and was intact. He stated he understood his medications and discharge instructions.

## 2015-07-11 NOTE — Progress Notes (Signed)
5 Days Post-Op Procedure(s) (LRB): CORONARY ARTERY BYPASS GRAFTING (CABG)x 4   utilizing the left internal mammary artery and endoscopically harvested bilateral  sapheneous vein. (N/A) TRANSESOPHAGEAL ECHOCARDIOGRAM (TEE) (N/A) Subjective: Feels well  Objective: Vital signs in last 24 hours: Temp:  [98.1 F (36.7 C)-99.2 F (37.3 C)] 98.8 F (37.1 C) (03/11 0331) Pulse Rate:  [90-99] 90 (03/11 0331) Cardiac Rhythm:  [-] Normal sinus rhythm (03/10 1900) Resp:  [18] 18 (03/11 0331) BP: (106-138)/(63-78) 106/70 mmHg (03/11 0331) SpO2:  [91 %-95 %] 92 % (03/11 0331) Weight:  [188 lb 3.2 oz (85.367 kg)] 188 lb 3.2 oz (85.367 kg) (03/11 0331)  Hemodynamic parameters for last 24 hours:    Intake/Output from previous day:   Intake/Output this shift:    General appearance: alert, cooperative and no distress Heart: regular rate and rhythm Lungs: mildly dim in bases Abdomen: benign Extremities: trace edema Wound: incis healing well  Lab Results:  Recent Labs  07/09/15 0313  WBC 15.2*  HGB 10.5*  HCT 31.1*  PLT 167   BMET:  Recent Labs  07/09/15 0313 07/10/15 0300  NA 136 139  K 3.5 3.3*  CL 100* 102  CO2 24 24  GLUCOSE 142* 134*  BUN 21* 21*  CREATININE 1.43* 1.28*  CALCIUM 8.5* 8.3*    PT/INR: No results for input(s): LABPROT, INR in the last 72 hours. ABG    Component Value Date/Time   PHART 7.318* 07/06/2015 2104   HCO3 24.2* 07/06/2015 2104   TCO2 25 07/07/2015 1625   ACIDBASEDEF 2.0 07/06/2015 2104   O2SAT 98.0 07/06/2015 2104   CBG (last 3)   Recent Labs  07/08/15 1608 07/08/15 2215 07/09/15 0620  GLUCAP 149* 136* 126*    Meds Scheduled Meds: . acetaminophen  1,000 mg Oral 4 times per day   Or  . acetaminophen (TYLENOL) oral liquid 160 mg/5 mL  1,000 mg Per Tube 4 times per day  . antiseptic oral rinse  7 mL Mouth Rinse QID  . aspirin EC  81 mg Oral Daily  . bisacodyl  10 mg Oral Daily   Or  . bisacodyl  10 mg Rectal Daily  .  chlorhexidine gluconate  15 mL Mouth Rinse BID  . clopidogrel  75 mg Oral Daily  . docusate sodium  200 mg Oral Daily  . furosemide  40 mg Oral Daily  . metoprolol tartrate  25 mg Oral BID  . moving right along book   Does not apply Once  . pantoprazole  40 mg Oral Daily  . pravastatin  40 mg Oral q1800  . sodium chloride flush  3 mL Intravenous Q12H  . sodium chloride flush  3 mL Intravenous Q12H  . thiamine  100 mg Oral Daily   Continuous Infusions:  PRN Meds:.sodium chloride, alum & mag hydroxide-simeth, magnesium hydroxide, metoprolol, ondansetron (ZOFRAN) IV, sodium chloride flush, sodium chloride flush, traMADol  Xrays No results found.  Assessment/Plan: S/P Procedure(s) (LRB): CORONARY ARTERY BYPASS GRAFTING (CABG)x 4   utilizing the left internal mammary artery and endoscopically harvested bilateral  sapheneous vein. (N/A) TRANSESOPHAGEAL ECHOCARDIOGRAM (TEE) (N/A) Plan for discharge: see discharge orders   LOS: 10 days    GOLD,WAYNE E 07/11/2015

## 2015-07-11 NOTE — Care Management Note (Signed)
Case Management Note  Patient Details  Name: [name] MRN: [id number] Date of Birth: 29-Oct-1933  Subjective/Objective:                  CORONARY ARTERY BYPASS GRAFTING (CABG)x 4 utilizing the left internal mammary artery and endoscopically harvested bilateral sapheneous vein. (N/A) TRANSESOPHAGEAL ECHOCARDIOGRAM (TEE) (N/A) Action/Plan: discharge planning Expected Discharge Date:  07/11/15               Expected Discharge Plan:  [place]  In-House Referral:     Discharge planning Services  CM Consult  Post Acute Care Choice:  Home Health Choice offered to:  Spouse, Patient  DME Arranged:  3-N-1, Walker rolling DME Agency:  [place]:  RN, PT [facility] Agency:  [place]  Status of Service:  Completed, signed off  Medicare Important Message Given:  Yes Date Medicare IM Given:    Medicare IM give by:    Date Additional Medicare IM Given:    Additional Medicare Important Message give by:     If discussed at [place] of Stay Meetings, dates discussed:    Additional Comments: CM spoke with pt and spouse for choice of home health agency. Pt chooses [facility] to render HHRN/PT. Referral called to [facility] rep, Tiffany.  Cm called DME rep, Jeneen Rinks to please deliver the 3n1 and rolling walker to room prior to discharge.  No other CM needs were communicated. Dellie Catholic, RN 07/11/2015, 9:22 AM

## 2015-07-11 NOTE — Progress Notes (Signed)
Physical Therapy Treatment Patient Details Name: [name] MRN: [id number] DOB: [birth year] Today's Date: 07-26-2015    History of Present Illness Patient is a 82 y/o male with hx of HTN, DM, Aneurysm of iliac artery and IBS present s/p CABG x4.    PT Comments    Patient progressing with more confidence on stairs and practiced different technique this session for better home simulation.  Wife present throughout session and voiced no concerns or questions.  Will benefit from HHPT to ensure compliance with sternal precautions and safety with walker in home environment.  Follow Up Recommendations  Home health PT;Supervision/Assistance - 24 hour     Equipment Recommendations  Rolling walker with 5" wheels    Recommendations for Other Services       Precautions / Restrictions Precautions Precautions: Sternal;Fall    Mobility  Bed Mobility               General bed mobility comments: up in chair  Transfers Overall transfer level: Needs assistance Equipment used: Rolling walker (2 wheeled) Transfers: Sit to/from Stand Sit to Stand: Min guard         General transfer comment: cues to scoot to edge of chair, minguard for balance  Ambulation/Gait Ambulation/Gait assistance: Supervision Ambulation Distance (Feet): 150 Feet Assistive device: Rolling walker (2 wheeled) Gait Pattern/deviations: Step-through pattern;Decreased stride length     General Gait Details: no c/o difficulty today with ambulation, demonstrated safe use of walker    Stairs Stairs: Yes Stairs assistance: Min assist Stair Management: One rail Right;Sideways;Step to pattern Number of Stairs: 4 General stair comments: cues for technique (reports only has one rail at home on entry steps)  assist for safety and discussed with wife level of assist (states grandsons will be there to assist)  Wheelchair Mobility    Modified Rankin (Stroke Patients Only)       Balance     Sitting  balance-Leahy Scale: Good       Standing balance-Leahy Scale: Fair                      Cognition Arousal/Alertness: Awake/alert Behavior During Therapy: WFL for tasks assessed/performed Overall Cognitive Status: Within Functional Limits for tasks assessed                      Exercises      General Comments        Pertinent Vitals/Pain Pain Assessment: No/denies pain    Home Living                      Prior Function            PT Goals (current goals can now be found in the care plan section) Progress towards PT goals: Progressing toward goals    Frequency  Min 3X/week    PT Plan Current plan remains appropriate    Co-evaluation             End of Session Equipment Utilized During Treatment: Gait belt Activity Tolerance: Patient tolerated treatment well Patient left: in chair;with call bell/phone within reach     Time: 0908-0920 PT Time Calculation (min) (ACUTE ONLY): 12 min  Charges:  $Gait Training: 8-22 mins                    G Codes:      Reginia Naas 07-26-2015, 10:06 AM  Magda Kiel, [place] Jul 26, 2015

## 2015-07-13 DIAGNOSIS — K219 Gastro-esophageal reflux disease without esophagitis: Secondary | ICD-10-CM | POA: Diagnosis not present

## 2015-07-13 DIAGNOSIS — I714 Abdominal aortic aneurysm, without rupture: Secondary | ICD-10-CM | POA: Diagnosis not present

## 2015-07-13 DIAGNOSIS — Z48812 Encounter for surgical aftercare following surgery on the circulatory system: Secondary | ICD-10-CM | POA: Diagnosis not present

## 2015-07-13 DIAGNOSIS — Z7901 Long term (current) use of anticoagulants: Secondary | ICD-10-CM | POA: Diagnosis not present

## 2015-07-13 DIAGNOSIS — I2510 Atherosclerotic heart disease of native coronary artery without angina pectoris: Secondary | ICD-10-CM | POA: Diagnosis not present

## 2015-07-13 DIAGNOSIS — E785 Hyperlipidemia, unspecified: Secondary | ICD-10-CM | POA: Diagnosis not present

## 2015-07-13 DIAGNOSIS — Z7982 Long term (current) use of aspirin: Secondary | ICD-10-CM | POA: Diagnosis not present

## 2015-07-13 DIAGNOSIS — E559 Vitamin D deficiency, unspecified: Secondary | ICD-10-CM | POA: Diagnosis not present

## 2015-07-13 DIAGNOSIS — Z95818 Presence of other cardiac implants and grafts: Secondary | ICD-10-CM | POA: Diagnosis not present

## 2015-07-13 DIAGNOSIS — I10 Essential (primary) hypertension: Secondary | ICD-10-CM | POA: Diagnosis not present

## 2015-07-13 DIAGNOSIS — E539 Vitamin B deficiency, unspecified: Secondary | ICD-10-CM | POA: Diagnosis not present

## 2015-07-13 DIAGNOSIS — I739 Peripheral vascular disease, unspecified: Secondary | ICD-10-CM | POA: Diagnosis not present

## 2015-07-17 DIAGNOSIS — E539 Vitamin B deficiency, unspecified: Secondary | ICD-10-CM | POA: Diagnosis not present

## 2015-07-17 DIAGNOSIS — Z7901 Long term (current) use of anticoagulants: Secondary | ICD-10-CM | POA: Diagnosis not present

## 2015-07-17 DIAGNOSIS — K219 Gastro-esophageal reflux disease without esophagitis: Secondary | ICD-10-CM | POA: Diagnosis not present

## 2015-07-17 DIAGNOSIS — E559 Vitamin D deficiency, unspecified: Secondary | ICD-10-CM | POA: Diagnosis not present

## 2015-07-17 DIAGNOSIS — I10 Essential (primary) hypertension: Secondary | ICD-10-CM | POA: Diagnosis not present

## 2015-07-17 DIAGNOSIS — E785 Hyperlipidemia, unspecified: Secondary | ICD-10-CM | POA: Diagnosis not present

## 2015-07-17 DIAGNOSIS — I714 Abdominal aortic aneurysm, without rupture: Secondary | ICD-10-CM | POA: Diagnosis not present

## 2015-07-17 DIAGNOSIS — Z95818 Presence of other cardiac implants and grafts: Secondary | ICD-10-CM | POA: Diagnosis not present

## 2015-07-17 DIAGNOSIS — Z7982 Long term (current) use of aspirin: Secondary | ICD-10-CM | POA: Diagnosis not present

## 2015-07-17 DIAGNOSIS — I2510 Atherosclerotic heart disease of native coronary artery without angina pectoris: Secondary | ICD-10-CM | POA: Diagnosis not present

## 2015-07-17 DIAGNOSIS — Z48812 Encounter for surgical aftercare following surgery on the circulatory system: Secondary | ICD-10-CM | POA: Diagnosis not present

## 2015-07-17 DIAGNOSIS — I739 Peripheral vascular disease, unspecified: Secondary | ICD-10-CM | POA: Diagnosis not present

## 2015-07-20 ENCOUNTER — Telehealth: Payer: Self-pay | Admitting: Cardiology

## 2015-07-20 MED ORDER — METOPROLOL TARTRATE 25 MG PO TABS: 12.5000 mg | ORAL_TABLET | Freq: Two times a day (BID) | ORAL | Status: DC

## 2015-07-20 NOTE — Telephone Encounter (Signed)
SPOKE TO PATIENT INSTRUCTION GIVEN TO DECREASE METOPROLOL TO 12.5 MG TWICE A DAY ( 1/2 OF 25 MG )  RN CHANGED DIRECTION ON MEDICATION LIST   PATIENT VERBALIZED UNDERSTANDING

## 2015-07-20 NOTE — Telephone Encounter (Signed)
New message      Pt had bypass on 07-06-15.  In the am, pt is really dizzy.  Could it be his medications?

## 2015-07-20 NOTE — Telephone Encounter (Signed)
Change metoprolol to 12.5 mg po BID. Kirk Ruths

## 2015-07-20 NOTE — Telephone Encounter (Signed)
Spoke to patient Patient states he dizzy, while standing , sitting  First think in the mornings   blood pressure as low as 101/? today's pressure 125/63 pulse 85 Patient states he does not feel safe walking because he becomes dizzy. Only cardiac medication taking is Metoprolol 25 mg b.i.d. Patient's states he was told to call if he had any issues   patient has an appointment on 07/24/15 with Kerin Ransom PA  Will defer to Dr Stanford Breed, patient aware

## 2015-07-21 ENCOUNTER — Telehealth: Payer: Self-pay | Admitting: Cardiology

## 2015-07-21 DIAGNOSIS — E785 Hyperlipidemia, unspecified: Secondary | ICD-10-CM | POA: Diagnosis not present

## 2015-07-21 DIAGNOSIS — Z48812 Encounter for surgical aftercare following surgery on the circulatory system: Secondary | ICD-10-CM | POA: Diagnosis not present

## 2015-07-21 DIAGNOSIS — Z7901 Long term (current) use of anticoagulants: Secondary | ICD-10-CM | POA: Diagnosis not present

## 2015-07-21 DIAGNOSIS — I2510 Atherosclerotic heart disease of native coronary artery without angina pectoris: Secondary | ICD-10-CM | POA: Diagnosis not present

## 2015-07-21 DIAGNOSIS — Z7982 Long term (current) use of aspirin: Secondary | ICD-10-CM | POA: Diagnosis not present

## 2015-07-21 DIAGNOSIS — K219 Gastro-esophageal reflux disease without esophagitis: Secondary | ICD-10-CM | POA: Diagnosis not present

## 2015-07-21 DIAGNOSIS — Z95818 Presence of other cardiac implants and grafts: Secondary | ICD-10-CM | POA: Diagnosis not present

## 2015-07-21 DIAGNOSIS — I739 Peripheral vascular disease, unspecified: Secondary | ICD-10-CM | POA: Diagnosis not present

## 2015-07-21 DIAGNOSIS — E539 Vitamin B deficiency, unspecified: Secondary | ICD-10-CM | POA: Diagnosis not present

## 2015-07-21 DIAGNOSIS — I10 Essential (primary) hypertension: Secondary | ICD-10-CM | POA: Diagnosis not present

## 2015-07-21 DIAGNOSIS — I714 Abdominal aortic aneurysm, without rupture: Secondary | ICD-10-CM | POA: Diagnosis not present

## 2015-07-21 DIAGNOSIS — E559 Vitamin D deficiency, unspecified: Secondary | ICD-10-CM | POA: Diagnosis not present

## 2015-07-21 NOTE — Telephone Encounter (Signed)
Hold metoprolol; increase po fluid intake [name]

## 2015-07-21 NOTE — Telephone Encounter (Signed)
Received call from [place], [place] for [facility]. She saw patient today. Notes he received instruction regarding medication changes from our office yesterday. We had advise patient to lower metoprolol dosing due to hypotensive episodes. She notes today pt had a sitting and standing BP w/ marked changes. 110/68 sitting, 78/58 standing. She reports HR of 84 w/ both. She did not obtain a 2nd standing BP.  Advised w/ recent changes (less than 24 hrs) to keep pt on same dose and allow for a couple days before further adjustments.   PT services not scheduled to return to see patient this week. Patient does not have other home care services. Patient has post hosp f/u on 3/24 w/ Luke. Informed PT we can check orthostatics at this time.  Routed to Dr. Stanford Breed for any further recommendations pre-visit.

## 2015-07-22 NOTE — Telephone Encounter (Signed)
Spoke with pt, Aware of dr crenshaw's recommendations.  °

## 2015-07-23 ENCOUNTER — Telehealth: Payer: Self-pay | Admitting: Cardiology

## 2015-07-23 NOTE — Telephone Encounter (Signed)
Spoke to physical therapist- ( instructed to call vital in today) Vital signs for today Earlier today - laying down  70/54  Pulse 136   later after drinking fluids and going to another room   109/54 pulse 96  Patient stopped metoprolol yesterday- did not take yesterday and today. Appointment - Tomorrow

## 2015-07-24 ENCOUNTER — Encounter: Payer: Self-pay | Admitting: Cardiology

## 2015-07-24 ENCOUNTER — Ambulatory Visit (INDEPENDENT_AMBULATORY_CARE_PROVIDER_SITE_OTHER): Payer: Medicare Other | Admitting: Cardiology

## 2015-07-24 VITALS — BP 92/62 | HR 112 | Ht 72.0 in | Wt 183.9 lb

## 2015-07-24 DIAGNOSIS — I214 Non-ST elevation (NSTEMI) myocardial infarction: Secondary | ICD-10-CM | POA: Diagnosis not present

## 2015-07-24 DIAGNOSIS — Z951 Presence of aortocoronary bypass graft: Secondary | ICD-10-CM

## 2015-07-24 DIAGNOSIS — I200 Unstable angina: Secondary | ICD-10-CM | POA: Diagnosis not present

## 2015-07-24 DIAGNOSIS — I951 Orthostatic hypotension: Secondary | ICD-10-CM

## 2015-07-24 LAB — CBC WITH DIFFERENTIAL/PLATELET
Basophils Absolute: 0.1 10*3/uL (ref 0.0–0.1)
Basophils Relative: 1 % (ref 0–1)
Eosinophils Absolute: 0.3 10*3/uL (ref 0.0–0.7)
Eosinophils Relative: 3 % (ref 0–5)
HCT: 38 % — ABNORMAL LOW (ref 39.0–52.0)
Hemoglobin: 12.2 g/dL — ABNORMAL LOW (ref 13.0–17.0)
Lymphocytes Relative: 29 % (ref 12–46)
Lymphs Abs: 3.1 10*3/uL (ref 0.7–4.0)
MCH: 31.1 pg (ref 26.0–34.0)
MCHC: 32.1 g/dL (ref 30.0–36.0)
MCV: 96.9 fL (ref 78.0–100.0)
MPV: 8.9 fL (ref 8.6–12.4)
Monocytes Absolute: 0.7 10*3/uL (ref 0.1–1.0)
Monocytes Relative: 7 % (ref 3–12)
Neutro Abs: 6.4 10*3/uL (ref 1.7–7.7)
Neutrophils Relative %: 60 % (ref 43–77)
Platelets: 515 10*3/uL — ABNORMAL HIGH (ref 150–400)
RBC: 3.92 MIL/uL — ABNORMAL LOW (ref 4.22–5.81)
RDW: 14.2 % (ref 11.5–15.5)
WBC: 10.7 10*3/uL — ABNORMAL HIGH (ref 4.0–10.5)

## 2015-07-24 LAB — BASIC METABOLIC PANEL
BUN: 23 mg/dL (ref 7–25)
CO2: 28 mmol/L (ref 20–31)
Calcium: 9.7 mg/dL (ref 8.6–10.3)
Chloride: 98 mmol/L (ref 98–110)
Creat: 1.37 mg/dL — ABNORMAL HIGH (ref 0.70–1.11)
Glucose, Bld: 132 mg/dL — ABNORMAL HIGH (ref 65–99)
Potassium: 5.2 mmol/L (ref 3.5–5.3)
Sodium: 140 mmol/L (ref 135–146)

## 2015-07-24 NOTE — Assessment & Plan Note (Signed)
LIMA-LAD, SVG-Dx, SVG-OM, SVG-RCA 07/07/15 

## 2015-07-24 NOTE — Assessment & Plan Note (Signed)
Pt seen today post CABG- he has had orthostatic B/P changes with symptoms of dizziness.

## 2015-07-24 NOTE — Patient Instructions (Signed)
Stat Lab today ( Bmet,CBC )  Take Metoprolol Tartrate 12.5 mg every day  Increase Fluids over the weekend  Follow Up with Kerin Ransom PA   Tuesday 08/04/15 at 3:00 pm

## 2015-07-24 NOTE — Assessment & Plan Note (Addendum)
Troponin 0.26 on 07/01/15- LVF 60-65% by echo

## 2015-07-24 NOTE — Progress Notes (Signed)
07/24/2015 [name]   Dec 06, 1933  [id number]  Primary Physician [name],[name], MD Primary Cardiologist: Dr Stanford Breed  HPI:  82 y/o male with known CAD s/p PCI to LAD 2009, and RCA in 2011, h/o HTN, HLD and borderline DM, presented to the ED 07/01/15 with chest pain and elevated Troponin. Cath revealed 3 V CAD, echo showed normal LVF. He underwent CABG x 4 07/07/15. Post op course was unremarkable. After DC he noted orthostatic dizziness. He was instructed to decrease his Metoprolol to 12.5 mg BID and push fluids. A HH RN saw him and called reporting low B/P when up "70" systolic. The pt was told to stop Metoprolol and come in to be checked. H e denies any unusual dyspnea. He has noted some increased palpitations off beta blocker (took Tenormin for years for PVCs). His appetite is poor. His wife has been following a strict no salt diet, no carb diet at home.    Current Outpatient Prescriptions  Medication Sig Dispense Refill  . Acetaminophen (TYLENOL PO) Take 2 tablets by mouth as needed.    Marland Kitchen aspirin EC 81 MG EC tablet Take 1 tablet (81 mg total) by mouth daily.    . Bisacodyl (DULCOLAX PO) Take 1 capsule by mouth as needed.    Marland Kitchen CALCIUM PO Take 1 tablet by mouth daily.    . clopidogrel (PLAVIX) 75 MG tablet Take 1 tablet (75 mg total) by mouth daily. 30 tablet 1  . Flaxseed, Linseed, 1000 MG CAPS Take 1,000 mg by mouth daily.     Marland Kitchen gabapentin (NEURONTIN) 300 MG capsule Take 300 mg by mouth daily.    Marland Kitchen glucose blood (ONE TOUCH ULTRA TEST) test strip Check glucose 1 time daily.  DX-R73.09 100 each 0  . Magnesium 250 MG TABS Take 250 mg by mouth daily.    . Methylcellulose, Laxative, (CITRUCEL PO) Take 1 Dose by mouth 2 (two) times daily.    . multivitamin (THERAGRAN) per tablet Take 1 tablet by mouth daily.      Marland Kitchen omeprazole (PRILOSEC) 20 MG capsule Take 20 mg by mouth daily as needed.    . pravastatin (PRAVACHOL) 40 MG tablet Take 1 tablet (40 mg total) by mouth daily at 6 PM.  30 tablet 1   No current facility-administered medications for this visit.    Allergies  Allergen Reactions  . Cymbalta [Duloxetine Hcl]     Dizziness  . Keflex [Cephalexin] Other (See Comments)    Reaction: unknown   . Simvastatin Other (See Comments)    Reaction: unknown   . Sudafed [Pseudoephedrine]     Dizziness  . Prednisone Rash    Social History   Social History  . Marital Status: Married    Spouse Name: N/A  . Number of Children: 4   . Years of Education: N/A   Occupational History  . Retired    Social History Main Topics  . Smoking status: Former Smoker    Quit date: 07/16/1963  . Smokeless tobacco: Never Used  . Alcohol Use: No  . Drug Use: No  . Sexual Activity: Not on file   Other Topics Concern  . Not on file   Social History Narrative   1 caffeine drink daily      Review of Systems: General: negative for chills, fever, night sweats or weight changes.  Cardiovascular: negative for chest pain, dyspnea on exertion, edema, orthopnea, palpitations, paroxysmal nocturnal dyspnea or shortness of breath Dermatological: negative for rash Respiratory: negative  for cough or wheezing Urologic: negative for hematuria Abdominal: negative for nausea, vomiting, diarrhea, bright red blood per rectum, melena, or hematemesis Neurologic: negative for visual changes, syncope, or dizziness All other systems reviewed and are otherwise negative except as noted above.    Blood pressure 92/62, pulse 112, height 6' (1.829 m), weight 183 lb 14.4 oz (83.416 kg).  General appearance: alert, cooperative and no distress Lungs: clear to auscultation bilaterally Heart: regular rate and rhythm and increased rate, no rub Extremities: no edema Skin: cool, pale, dry Neurologic: Grossly normal  EKG NSR, ST, PVCs  ASSESSMENT AND PLAN:   Orthostatic hypotension Pt seen today post CABG- he has had orthostatic B/P changes with symptoms of dizziness.   NSTEMI (non-ST elevated  myocardial infarction) (HCC) Troponin 0.26 on 07/01/15- LVF 60-65% by echo  S/P CABG x 4 LIMA-LAD, SVG-Dx, SVG-OM, SVG-RCA 07/07/15   PLAN  I suggested [name] resume Metoprolol at 12.5 mg daily. He should push fluids over the weekend and I told Ms Berthelsen she could liberalize his sodium and carb intake. I did get STAT labs today- BMP and CBC. He'll return in 1-2 weeks for follow up.   Erlene Quan PA-C 07/24/2015 3:34 PM

## 2015-07-28 ENCOUNTER — Telehealth: Payer: Self-pay | Admitting: Cardiology

## 2015-07-28 NOTE — Telephone Encounter (Signed)
Spoke with pt, Aware of dr crenshaw's recommendations.  °

## 2015-07-28 NOTE — Telephone Encounter (Signed)
Mrs. Mcnell is calling about lab results , form labs that were done on Friday.  Also his blood pressure is very low and when she stands up she is unable to get a reading on it . Please call   Thanks

## 2015-07-28 NOTE — Telephone Encounter (Signed)
Historic pt of Dr. Stanford Breed  Pt post CABG 3/6 by Dr. Prescott Gum. Seen by Lurena Joiner 3/24 for f/u. Instructed to increase fluids, also to resume metoprolol at 12.5mg  daily - pt had not been taking & previously prescribed at 12.5mg  BID.  [name] took reading of BP 99/61 HR 110 sitting  Retook BP standing later in the AM: 73/46 HR 89    Sitting again was 110/69.  Notes mornings are worst part of the day, pt denies that just standing makes him dizzy, but he states overall he still gets lightheaded/dizzy w/ moving around.  Denies SOB, CP. He notes usually feels better around 1-2pm and less symptomatic. Pt reports compliance w/ fluid intake increase - drinks approx 3-4 16oz bottles of water daily.  Pt has return visit w/ Lurena Joiner on 4/4. Routed to Dr. Stanford Breed for recommendations.

## 2015-07-28 NOTE — Telephone Encounter (Signed)
Force fluids; increase NA intake; DC metoprolol Kirk Ruths

## 2015-07-29 ENCOUNTER — Other Ambulatory Visit: Payer: Self-pay | Admitting: *Deleted

## 2015-07-29 MED ORDER — ONETOUCH LANCETS MISC: Status: DC

## 2015-07-30 ENCOUNTER — Other Ambulatory Visit: Payer: Self-pay | Admitting: Internal Medicine

## 2015-07-30 DIAGNOSIS — I739 Peripheral vascular disease, unspecified: Secondary | ICD-10-CM | POA: Diagnosis not present

## 2015-07-30 DIAGNOSIS — K219 Gastro-esophageal reflux disease without esophagitis: Secondary | ICD-10-CM | POA: Diagnosis not present

## 2015-07-30 DIAGNOSIS — Z95818 Presence of other cardiac implants and grafts: Secondary | ICD-10-CM | POA: Diagnosis not present

## 2015-07-30 DIAGNOSIS — E785 Hyperlipidemia, unspecified: Secondary | ICD-10-CM | POA: Diagnosis not present

## 2015-07-30 DIAGNOSIS — Z48812 Encounter for surgical aftercare following surgery on the circulatory system: Secondary | ICD-10-CM | POA: Diagnosis not present

## 2015-07-30 DIAGNOSIS — I714 Abdominal aortic aneurysm, without rupture: Secondary | ICD-10-CM | POA: Diagnosis not present

## 2015-07-30 DIAGNOSIS — Z7982 Long term (current) use of aspirin: Secondary | ICD-10-CM | POA: Diagnosis not present

## 2015-07-30 DIAGNOSIS — E539 Vitamin B deficiency, unspecified: Secondary | ICD-10-CM | POA: Diagnosis not present

## 2015-07-30 DIAGNOSIS — I10 Essential (primary) hypertension: Secondary | ICD-10-CM | POA: Diagnosis not present

## 2015-07-30 DIAGNOSIS — Z7901 Long term (current) use of anticoagulants: Secondary | ICD-10-CM | POA: Diagnosis not present

## 2015-07-30 DIAGNOSIS — I2510 Atherosclerotic heart disease of native coronary artery without angina pectoris: Secondary | ICD-10-CM | POA: Diagnosis not present

## 2015-07-30 DIAGNOSIS — E559 Vitamin D deficiency, unspecified: Secondary | ICD-10-CM | POA: Diagnosis not present

## 2015-07-31 ENCOUNTER — Telehealth: Payer: Self-pay | Admitting: Cardiology

## 2015-07-31 NOTE — Telephone Encounter (Signed)
Left msg to call. Seen by Lurena Joiner on 3/24. Addressed similar concerns earlier in the week.

## 2015-07-31 NOTE — Telephone Encounter (Signed)
New message     Pt does not look good to the nurse.  Sitting bp is 120/76, standing 78/58; sitting heart rate is 100, walked 2.5 minutes HR is 120 and regular. Wt. is steady.  Two weeks ago, HR was 72 sitting and after 2.5 minutes of walking it was 88.  Patient stopped his metoprolol on 07-22-15.  Nurse says patient does not look good and patient states he does not feel good.  Please advise

## 2015-08-04 ENCOUNTER — Other Ambulatory Visit: Payer: Self-pay | Admitting: Cardiothoracic Surgery

## 2015-08-04 ENCOUNTER — Ambulatory Visit (INDEPENDENT_AMBULATORY_CARE_PROVIDER_SITE_OTHER): Payer: Medicare Other | Admitting: Cardiology

## 2015-08-04 ENCOUNTER — Encounter: Payer: Self-pay | Admitting: Cardiology

## 2015-08-04 VITALS — BP 72/56 | HR 112 | Ht 72.0 in | Wt 187.0 lb

## 2015-08-04 DIAGNOSIS — I951 Orthostatic hypotension: Secondary | ICD-10-CM

## 2015-08-04 DIAGNOSIS — R000 Tachycardia, unspecified: Secondary | ICD-10-CM

## 2015-08-04 DIAGNOSIS — Z951 Presence of aortocoronary bypass graft: Secondary | ICD-10-CM

## 2015-08-04 DIAGNOSIS — E782 Mixed hyperlipidemia: Secondary | ICD-10-CM

## 2015-08-04 MED ORDER — ATENOLOL 25 MG PO TABS: 25.0000 mg | ORAL_TABLET | Freq: Every day | ORAL | Status: DC

## 2015-08-04 NOTE — Progress Notes (Signed)
08/04/2015 [name]   1933/05/17  [id number]  Primary Physician [name],[name], MD Primary Cardiologist: Dr Stanford Breed  HPI:  82 y/o male with known CAD s/p PCI to LAD 2009, and RCA in 2011, h/o HTN, HLD and borderline DM. He presented to the ED 07/01/15 with chest pain and elevated Troponin. Cath revealed 3 V CAD, echo showed normal LVF. He underwent CABG x 4 on 07/07/15. Post op course was unremarkable. After DC he noted orthostatic dizziness. He was instructed to decrease his Metoprolol to 12.5 mg BID and push fluids. A HH RN saw him and called reporting low B/P when up "70" systolic. The pt was told to stop Metoprolol and come in to be checked. I saw him 07/24/15. I suggested he liberalize his sodium intake and push fluids over the weekend. I ordered STAT BMP and CBC. He appeared to be mildly dehydrated, not significantly anemic. He is in the office today for follow up. He says he feels better but still "whoozy" when he gets up. He is tachycardic at rest. He never resumed low dose metoprolol as I suggested- someone told him not to take it.    Current Outpatient Prescriptions  Medication Sig Dispense Refill  . Acetaminophen (TYLENOL PO) Take 2 tablets by mouth as needed.    Marland Kitchen aspirin EC 81 MG EC tablet Take 1 tablet (81 mg total) by mouth daily.    . Bisacodyl (DULCOLAX PO) Take 1 capsule by mouth as needed.    Marland Kitchen CALCIUM PO Take 1 tablet by mouth daily.    . clopidogrel (PLAVIX) 75 MG tablet Take 1 tablet (75 mg total) by mouth daily. 30 tablet 1  . Flaxseed, Linseed, 1000 MG CAPS Take 1,000 mg by mouth daily.     Marland Kitchen gabapentin (NEURONTIN) 300 MG capsule Take 300 mg by mouth daily.    . Magnesium 250 MG TABS Take 250 mg by mouth daily.    . Methylcellulose, Laxative, (CITRUCEL PO) Take 1 Dose by mouth 2 (two) times daily.    . multivitamin (THERAGRAN) per tablet Take 1 tablet by mouth daily.      Marland Kitchen omeprazole (PRILOSEC) 20 MG capsule Take 20 mg by mouth daily as needed.    . ONE  TOUCH LANCETS MISC check blood sugar 1 time daily-DX-R73.03. 200 each 0  . ONE TOUCH ULTRA TEST test strip CHECK BLOOD GLUCOSE 1 TIME DAILY 100 each 12  . pravastatin (PRAVACHOL) 40 MG tablet Take 1 tablet (40 mg total) by mouth daily at 6 PM. 30 tablet 1  . atenolol (TENORMIN) 25 MG tablet Take 1 tablet (25 mg total) by mouth daily. 30 tablet 11   No current facility-administered medications for this visit.    Allergies  Allergen Reactions  . Cymbalta [Duloxetine Hcl]     Dizziness  . Keflex [Cephalexin] Other (See Comments)    Reaction: unknown   . Simvastatin Other (See Comments)    Reaction: unknown   . Sudafed [Pseudoephedrine]     Dizziness  . Prednisone Rash    Social History   Social History  . Marital Status: Married    Spouse Name: N/A  . Number of Children: 4   . Years of Education: N/A   Occupational History  . Retired    Social History Main Topics  . Smoking status: Former Smoker    Quit date: 07/16/1963  . Smokeless tobacco: Never Used  . Alcohol Use: No  . Drug Use: No  . Sexual Activity:  Not on file   Other Topics Concern  . Not on file   Social History Narrative   1 caffeine drink daily      Review of Systems: General: negative for chills, fever, night sweats or weight changes.  Cardiovascular: negative for chest pain, dyspnea on exertion, edema, orthopnea, palpitations, paroxysmal nocturnal dyspnea or shortness of breath Dermatological: negative for rash Respiratory: negative for cough or wheezing Urologic: negative for hematuria Abdominal: negative for nausea, vomiting, diarrhea, bright red blood per rectum, melena, or hematemesis Neurologic: negative for visual changes, syncope, or dizziness All other systems reviewed and are otherwise negative except as noted above.    Blood pressure 72/56, pulse 112, height 6' (1.829 m), weight 187 lb (84.823 kg).  General appearance: alert, cooperative and no distress Lungs: clear to auscultation  bilaterally Heart: regular rate and rhythm Skin: Skin color, texture, turgor normal. No rashes or lesions Neurologic: Grossly normal   ASSESSMENT AND PLAN:   Orthostatic hypotension He is still orhtostatic, though less symptomatic 132/72 laying, 122/70 sitting, 102/ 60 standing  S/P CABG x 4 LIMA-LAD, SVG-Dx, SVG-OM, SVG-RCA 07/07/15  Hyperlipidemia On statin   PLAN  The pt says he took Tenormin 25 mg "for years" he is hesitant to take metoprolol. I told him to go ahead and resume his Atenolol 25 mg-It's unlikely this small dose will any effect on his B/P. If he continues to be orthostatic in the next few weeks we may need to consider Florinef or Midodrine.   Erlene Quan PA-C 08/04/2015 3:33 PM

## 2015-08-04 NOTE — Patient Instructions (Signed)
Kerin Ransom, [place], has recommended making the following medication changes: 1. START Atenolol 25 mg - take 1 tablet by mouth daily  **In 2 weeks, if your blood pressure is still dropping when you stand, please call the office to speak with a nurse. We may add another medication.  Lurena Joiner recommends that you schedule a follow-up appointment in 4-6 weeks with Dr Stanford Breed.  If you need a refill on your cardiac medications before your next appointment, please call your pharmacy.

## 2015-08-04 NOTE — Telephone Encounter (Signed)
Pt to see Lurena Joiner today for 1 week f/u.

## 2015-08-04 NOTE — Assessment & Plan Note (Signed)
On statin.

## 2015-08-04 NOTE — Assessment & Plan Note (Signed)
He is still orhtostatic, though less symptomatic 132/72 laying, 122/70 sitting, 102/ 60 standing

## 2015-08-04 NOTE — Assessment & Plan Note (Signed)
LIMA-LAD, SVG-Dx, SVG-OM, SVG-RCA 07/07/15 

## 2015-08-05 ENCOUNTER — Encounter: Payer: Self-pay | Admitting: Cardiothoracic Surgery

## 2015-08-05 ENCOUNTER — Ambulatory Visit: Payer: Self-pay | Admitting: Internal Medicine

## 2015-08-05 ENCOUNTER — Ambulatory Visit (INDEPENDENT_AMBULATORY_CARE_PROVIDER_SITE_OTHER): Payer: Self-pay | Admitting: Cardiothoracic Surgery

## 2015-08-05 ENCOUNTER — Ambulatory Visit
Admission: RE | Admit: 2015-08-05 | Discharge: 2015-08-05 | Disposition: A | Discharge status: Home / Self Care | Payer: Medicare Other | Source: Ambulatory Visit | Attending: Cardiothoracic Surgery | Admitting: Cardiothoracic Surgery

## 2015-08-05 VITALS — BP 110/70 | HR 75 | Resp 16 | Ht 72.0 in | Wt 183.0 lb

## 2015-08-05 DIAGNOSIS — I2510 Atherosclerotic heart disease of native coronary artery without angina pectoris: Secondary | ICD-10-CM

## 2015-08-05 DIAGNOSIS — Z951 Presence of aortocoronary bypass graft: Secondary | ICD-10-CM

## 2015-08-05 DIAGNOSIS — J90 Pleural effusion, not elsewhere classified: Secondary | ICD-10-CM | POA: Diagnosis not present

## 2015-08-05 NOTE — Progress Notes (Signed)
PCP is Alesia Richards, MD Referring Provider is Minus Breeding, MD  Chief Complaint  Patient presents with  . Routine Post Op    4 wk f/u s/p CABG X 4 .. 07/06/15 with a CXR    HPI:82 year old male returns for her first postop office visit after multivessel CABG for non-ST elevation MI. He did well after surgery. He maintained sinus rhythm. Since returning home he has had orthostatic dizziness. He checks his blood pressure sitting and standing several times a day. He has had no falls. When he gets up at night to visit the bathroom his wife must help  Him because orthostatic dizziness. He is back on his Tenormin 25 mg daily which he has  taken for several years.  The patient has had no recurrent symptoms of angina. He denies swelling or symptoms of CHF. The surgical incisions are healing well. He has required no significant pain medication. He is anxious to resume driving and increase his activity limits. [facility]. He currently is walking about 10 minutes  daily.  Chest x-ray taken today shows no pleural effusion clear lung fields stable cardiac silhouette, stable sternal wires.  Past Medical History  Diagnosis Date  . Coronary atherosclerosis of unspecified type of vessel, native or graft   . 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 Personal history of other diseases of digestive system   . Other and unspecified hyperlipidemia   . Colon polyps   . Gastritis, chronic   . IBS (irritable bowel syndrome)   . Hypertension   . Diabetes mellitus     Diet control   . Vitamin D deficiency   . Vitamin B 12 deficiency   . Esophageal reflux     Past Surgical History  Procedure Laterality Date  . Knee surgery    . Bladder surgery    . Coronary angioplasty with stent placement    . Cardiac catheterization N/A 07/01/2015    Procedure: Left Heart Cath and Coronary Angiography;  Surgeon: Wellington Hampshire, MD;  Location: [facility];  Service: Cardiovascular;  Laterality: N/A;  . Coronary artery bypass graft N/A 07/06/2015    Procedure: CORONARY ARTERY BYPASS GRAFTING (CABG)x 4   utilizing the left internal mammary artery and endoscopically harvested bilateral  sapheneous vein.;  Surgeon: Ivin Poot, MD;  Location: [facility];  Service: Open Heart Surgery;  Laterality: N/A;  . Tee without cardioversion N/A 07/06/2015    Procedure: TRANSESOPHAGEAL ECHOCARDIOGRAM (TEE);  Surgeon: Ivin Poot, MD;  Location: [facility];  Service: Open Heart Surgery;  Laterality: N/A;    Family History  Problem Relation Age of Onset  . Heart attack Father     died age 49  . Heart attack Brother     died age 89  . Heart attack Sister     died age 65  . Colon cancer Sister   . Liver cancer Sister   . Diabetes Maternal Grandmother   . Colon polyps Sister     and brothers x 2     Social History Social History  Substance Use Topics  . Smoking status: Former Smoker    Quit date: 07/16/1963  . Smokeless tobacco: Never Used  . Alcohol Use: No    Current Outpatient Prescriptions  Medication Sig Dispense Refill  . Acetaminophen (TYLENOL PO) Take 2 tablets by mouth as needed.    Marland Kitchen aspirin EC 81 MG EC tablet Take  1 tablet (81 mg total) by mouth daily.    Marland Kitchen atenolol (TENORMIN) 25 MG tablet Take 1 tablet (25 mg total) by mouth daily. 30 tablet 11  . Bisacodyl (DULCOLAX PO) Take 1 capsule by mouth as needed.    Marland Kitchen CALCIUM PO Take 1 tablet by mouth daily.    . clopidogrel (PLAVIX) 75 MG tablet Take 1 tablet (75 mg total) by mouth daily. 30 tablet 1  . Flaxseed, Linseed, 1000 MG CAPS Take 1,000 mg by mouth daily.     . Magnesium 250 MG TABS Take 250 mg by mouth daily.    . Methylcellulose, Laxative, (CITRUCEL PO) Take 1 Dose by mouth 2 (two) times daily.    . multivitamin (THERAGRAN) per tablet Take 1 tablet by mouth daily.      Marland Kitchen omeprazole (PRILOSEC) 20 MG capsule Take 20 mg by mouth daily as needed.    . ONE TOUCH LANCETS  MISC check blood sugar 1 time daily-[id number]. 200 each 0  . ONE TOUCH ULTRA TEST test strip CHECK BLOOD GLUCOSE 1 TIME DAILY 100 each 12  . pravastatin (PRAVACHOL) 40 MG tablet Take 1 tablet (40 mg total) by mouth daily at 6 PM. 30 tablet 1  . gabapentin (NEURONTIN) 300 MG capsule Take 300 mg by mouth daily.     No current facility-administered medications for this visit.    Allergies  Allergen Reactions  . Cymbalta [Duloxetine Hcl]     Dizziness  . Keflex [Cephalexin] Other (See Comments)    Reaction: unknown   . Simvastatin Other (See Comments)    Reaction: unknown   . Sudafed [Pseudoephedrine]     Dizziness  . Prednisone Rash    Review of Systems   Main problem has been orthostatic dizziness.  BP 110/70 mmHg  Pulse 75  Resp 16  Ht 6' (1.829 m)  Wt 183 lb (83.008 kg)  BMI 24.81 kg/m2  SpO2 98% Physical Exam Alert and comfortable Lungs clear Heart rhythm regular Sternal and leg incision well-healed No peripheral edema Neuro intact  Diagnostic Tests: Chest x-ray clear  Impression: We'll start the patient on low-dose midodrine 5 mg twice a day. He will be checked by his cardiologist in approximately 5 weeks. He can now start driving and we will refer into outpatient cardiac rehabilitation. His lifting limit is 20 pounds until 3 months after surgery. He knows he can start using a riding lawnmower later this month, or 6 weeks after surgery.  Plan:start midodrine orthostatic hypotension. Increased walking intervals and liberalize activity limits as noted above. Refer to the patient's phase II cardiac rehabilitation at [facility].   Len Childs, MD [facility] [phone]

## 2015-08-07 ENCOUNTER — Telehealth: Payer: Self-pay | Admitting: *Deleted

## 2015-08-07 DIAGNOSIS — I2510 Atherosclerotic heart disease of native coronary artery without angina pectoris: Secondary | ICD-10-CM | POA: Diagnosis not present

## 2015-08-07 DIAGNOSIS — Z7901 Long term (current) use of anticoagulants: Secondary | ICD-10-CM | POA: Diagnosis not present

## 2015-08-07 DIAGNOSIS — I10 Essential (primary) hypertension: Secondary | ICD-10-CM | POA: Diagnosis not present

## 2015-08-07 DIAGNOSIS — Z48812 Encounter for surgical aftercare following surgery on the circulatory system: Secondary | ICD-10-CM | POA: Diagnosis not present

## 2015-08-07 DIAGNOSIS — I714 Abdominal aortic aneurysm, without rupture: Secondary | ICD-10-CM | POA: Diagnosis not present

## 2015-08-07 DIAGNOSIS — E559 Vitamin D deficiency, unspecified: Secondary | ICD-10-CM | POA: Diagnosis not present

## 2015-08-07 DIAGNOSIS — K219 Gastro-esophageal reflux disease without esophagitis: Secondary | ICD-10-CM | POA: Diagnosis not present

## 2015-08-07 DIAGNOSIS — E785 Hyperlipidemia, unspecified: Secondary | ICD-10-CM | POA: Diagnosis not present

## 2015-08-07 DIAGNOSIS — Z7982 Long term (current) use of aspirin: Secondary | ICD-10-CM | POA: Diagnosis not present

## 2015-08-07 DIAGNOSIS — E539 Vitamin B deficiency, unspecified: Secondary | ICD-10-CM | POA: Diagnosis not present

## 2015-08-07 DIAGNOSIS — I739 Peripheral vascular disease, unspecified: Secondary | ICD-10-CM | POA: Diagnosis not present

## 2015-08-07 DIAGNOSIS — Z95818 Presence of other cardiac implants and grafts: Secondary | ICD-10-CM | POA: Diagnosis not present

## 2015-08-07 NOTE — Telephone Encounter (Signed)
Phase 2 orders faxed

## 2015-08-08 ENCOUNTER — Other Ambulatory Visit: Payer: Self-pay | Admitting: Surgical

## 2015-08-11 ENCOUNTER — Encounter: Payer: Self-pay | Admitting: Internal Medicine

## 2015-08-11 ENCOUNTER — Ambulatory Visit (INDEPENDENT_AMBULATORY_CARE_PROVIDER_SITE_OTHER): Payer: Medicare Other | Admitting: Internal Medicine

## 2015-08-11 VITALS — BP 106/60 | HR 68 | Temp 97.6°F | Resp 16 | Ht 72.5 in | Wt 187.0 lb

## 2015-08-11 DIAGNOSIS — I951 Orthostatic hypotension: Secondary | ICD-10-CM

## 2015-08-11 DIAGNOSIS — E782 Mixed hyperlipidemia: Secondary | ICD-10-CM

## 2015-08-11 DIAGNOSIS — R7309 Other abnormal glucose: Secondary | ICD-10-CM | POA: Diagnosis not present

## 2015-08-11 DIAGNOSIS — R7303 Prediabetes: Secondary | ICD-10-CM

## 2015-08-11 DIAGNOSIS — Z79899 Other long term (current) drug therapy: Secondary | ICD-10-CM | POA: Diagnosis not present

## 2015-08-11 DIAGNOSIS — Z515 Encounter for palliative care: Secondary | ICD-10-CM | POA: Insufficient documentation

## 2015-08-11 DIAGNOSIS — I10 Essential (primary) hypertension: Secondary | ICD-10-CM | POA: Diagnosis not present

## 2015-08-11 DIAGNOSIS — E559 Vitamin D deficiency, unspecified: Secondary | ICD-10-CM | POA: Diagnosis not present

## 2015-08-11 LAB — BASIC METABOLIC PANEL WITH GFR
BUN: 18 mg/dL (ref 7–25)
CO2: 28 mmol/L (ref 20–31)
Calcium: 9.5 mg/dL (ref 8.6–10.3)
Chloride: 102 mmol/L (ref 98–110)
Creat: 1.44 mg/dL — ABNORMAL HIGH (ref 0.70–1.11)
GFR, Est African American: 53 mL/min — ABNORMAL LOW (ref >=60)
GFR, Est Non African American: 46 mL/min — ABNORMAL LOW (ref >=60)
Glucose, Bld: 107 mg/dL — ABNORMAL HIGH (ref 65–99)
Potassium: 5.4 mmol/L — ABNORMAL HIGH (ref 3.5–5.3)
Sodium: 140 mmol/L (ref 135–146)

## 2015-08-11 LAB — CBC WITH DIFFERENTIAL/PLATELET
Basophils Absolute: 0 cells/uL (ref 0–200)
Basophils Relative: 0 %
Eosinophils Absolute: 380 cells/uL (ref 15–500)
Eosinophils Relative: 4 %
HCT: 38.1 % — ABNORMAL LOW (ref 38.5–50.0)
Hemoglobin: 12.1 g/dL — ABNORMAL LOW (ref 13.2–17.1)
Lymphocytes Relative: 31 %
Lymphs Abs: 2945 cells/uL (ref 850–3900)
MCH: 30.8 pg (ref 27.0–33.0)
MCHC: 31.8 g/dL — ABNORMAL LOW (ref 32.0–36.0)
MCV: 96.9 fL (ref 80.0–100.0)
MPV: 8.5 fL (ref 7.5–12.5)
Monocytes Absolute: 570 cells/uL (ref 200–950)
Monocytes Relative: 6 %
Neutro Abs: 5605 cells/uL (ref 1500–7800)
Neutrophils Relative %: 59 %
Platelets: 272 10*3/uL (ref 140–400)
RBC: 3.93 MIL/uL — ABNORMAL LOW (ref 4.20–5.80)
RDW: 13.6 % (ref 11.0–15.0)
WBC: 9.5 10*3/uL (ref 3.8–10.8)

## 2015-08-11 LAB — HEPATIC FUNCTION PANEL
ALT: 6 U/L — ABNORMAL LOW (ref 9–46)
AST: 18 U/L (ref 10–35)
Albumin: 3.8 g/dL (ref 3.6–5.1)
Alkaline Phosphatase: 72 U/L (ref 40–115)
Bilirubin, Direct: 0.1 mg/dL (ref <=0.2)
Indirect Bilirubin: 0.4 mg/dL (ref 0.2–1.2)
Total Bilirubin: 0.5 mg/dL (ref 0.2–1.2)
Total Protein: 6.7 g/dL (ref 6.1–8.1)

## 2015-08-11 LAB — TSH: TSH: 2.17 mIU/L (ref 0.40–4.50)

## 2015-08-11 LAB — MAGNESIUM: Magnesium: 2.3 mg/dL (ref 1.5–2.5)

## 2015-08-11 MED ORDER — FLUDROCORTISONE ACETATE 0.1 MG PO TABS: ORAL_TABLET | ORAL | Status: DC

## 2015-08-11 NOTE — Patient Instructions (Signed)
Recommend Adult Low Dose Aspirin or   coated  Aspirin 81 mg daily   To reduce risk of Colon Cancer 20 %,   Skin Cancer 26 % ,   Melanoma 46%   and   Pancreatic cancer 60%   ++++++++++++++++++++++++++++++++++++++++++++++++++++++ Vitamin D goal   is between 70-100.   Please make sure that you are taking your Vitamin D as directed.   It is very important as a natural anti-inflammatory   helping hair, skin, and nails, as well as reducing stroke and heart attack risk.   It helps your bones and helps with mood.  It also decreases numerous cancer risks so please take it as directed.   Low Vit D is associated with a 200-300% higher risk for CANCER   and 200-300% higher risk for HEART   ATTACK  &  STROKE.   ......................................  It is also associated with higher death rate at younger ages,   autoimmune diseases like Rheumatoid arthritis, Lupus, Multiple Sclerosis.     Also many other serious conditions, like depression, Alzheimer's  Dementia, infertility, muscle aches, fatigue, fibromyalgia - just to name a few.  ++++++++++++++++++++++++++++++++++++++++++++++++  Recommend the book "The END of DIETING" by Dr Joel Fuhrman   & the book "The END of DIABETES " by Dr Joel Fuhrman  At [employer] - get book & Audio CD's     Being diabetic has a  300% increased risk for heart attack, stroke, cancer, and alzheimer- type vascular dementia. It is very important that you work harder with diet by avoiding all foods that are white. Avoid white rice (brown & wild rice is OK), white potatoes (sweetpotatoes in moderation is OK), White bread or wheat bread or anything made out of white flour like bagels, donuts, rolls, buns, biscuits, cakes, pastries, cookies, pizza crust, and pasta (made from white flour & egg whites) - vegetarian pasta or spinach or wheat pasta is OK. Multigrain breads like Arnold's or Pepperidge [employer], or multigrain sandwich thins or flatbreads.  Diet,  exercise and weight loss can reverse and cure diabetes in the early stages.  Diet, exercise and weight loss is very important in the control and prevention of complications of diabetes which affects every system in your body, ie. Brain - dementia/stroke, eyes - glaucoma/blindness, heart - heart attack/heart failure, kidneys - dialysis, stomach - gastric paralysis, intestines - malabsorption, nerves - severe painful neuritis, circulation - gangrene & loss of a leg(s), and finally cancer and Alzheimers.    I recommend avoid fried & greasy foods,  sweets/candy, white rice (brown or wild rice or Quinoa is OK), white potatoes (sweet potatoes are OK) - anything made from white flour - bagels, doughnuts, rolls, buns, biscuits,white and wheat breads, pizza crust and traditional pasta made of white flour & egg white(vegetarian pasta or spinach or wheat pasta is OK).  Multi-grain bread is OK - like multi-grain flat bread or sandwich thins. Avoid alcohol in excess. Exercise is also important.    Eat all the vegetables you want - avoid meat, especially red meat and dairy - especially cheese.  Cheese is the most concentrated form of trans-fats which is the worst thing to clog up our arteries. Veggie cheese is OK which can be found in the fresh produce section at [employer] or [employer] or [employer]  ++++++++++++++++++++++++++++++++++++++++++++++++++ DASH Eating Plan  DASH stands for "Dietary Approaches to Stop Hypertension."   The DASH eating plan is a healthy eating plan that has been shown to reduce high blood   pressure (hypertension). Additional health benefits may include reducing the risk of type 2 diabetes mellitus, heart disease, and stroke. The DASH eating plan may also help with weight loss.  WHAT DO I NEED TO KNOW ABOUT THE DASH EATING PLAN?  For the DASH eating plan, you will follow these general guidelines:  Choose foods with a percent daily value for sodium of less than 5% (as listed on the food  label).  Use salt-free seasonings or herbs instead of table salt or sea salt.  Check with your health care provider or pharmacist before using salt substitutes.  Eat lower-sodium products, often labeled as "lower sodium" or "no salt added."  Eat fresh foods.  Eat more vegetables, fruits, and low-fat dairy products.    Choose whole grains. Look for the word "whole" as the first word in the ingredient list.  Choose fish   Limit sweets, desserts, sugars, and sugary drinks.  Choose heart-healthy fats.  Eat veggie cheese   Eat more home-cooked food and less restaurant, buffet, and fast food.  Limit fried foods.  Cook foods using methods other than frying.  Limit canned vegetables. If you do use them, rinse them well to decrease the sodium.  When eating at a restaurant, ask that your food be prepared with less salt, or no salt if possible.                      WHAT FOODS CAN I EAT?  Read Dr Joel Fuhrman's books on The End of Dieting & The End of Diabetes  Grains  Whole grain or whole wheat bread. Brown rice. Whole grain or whole wheat pasta. Quinoa, bulgur, and whole grain cereals. Low-sodium cereals. Corn or whole wheat flour tortillas. Whole grain cornbread. Whole grain crackers. Low-sodium crackers.  Vegetables  Fresh or frozen vegetables (raw, steamed, roasted, or grilled). Low-sodium or reduced-sodium tomato and vegetable juices. Low-sodium or reduced-sodium tomato sauce and paste. Low-sodium or reduced-sodium canned vegetables.   Fruits  All fresh, canned (in natural juice), or frozen fruits.  Protein Products   All fish and seafood.  Dried beans, peas, or lentils. Unsalted nuts and seeds. Unsalted canned beans.  Dairy  Low-fat dairy products, such as skim or 1% milk, 2% or reduced-fat cheeses, low-fat ricotta or cottage cheese, or plain low-fat yogurt. Low-sodium or reduced-sodium cheeses.  Fats and Oils  Tub margarines without trans fats. Light or  reduced-fat mayonnaise and salad dressings (reduced sodium). Avocado. Safflower, olive, or canola oils. Natural peanut or almond butter.  Other  Unsalted popcorn and pretzels. The items listed above may not be a complete list of recommended foods or beverages. Contact your dietitian for more options.  +++++++++++++++++++++++++++++++++++++++++++  WHAT FOODS ARE NOT RECOMMENDED?  Grains/ White flour or wheat flour  White bread. White pasta. White rice. Refined cornbread. Bagels and croissants. Crackers that contain trans fat.  Vegetables  Creamed or fried vegetables. Vegetables in a . Regular canned vegetables. Regular canned tomato sauce and paste. Regular tomato and vegetable juices.  Fruits  Dried fruits. Canned fruit in light or heavy syrup. Fruit juice.  Meat and Other Protein Products  Meat in general - RED mwaet & White meat.  Fatty cuts of meat. Ribs, chicken wings, bacon, sausage, bologna, salami, chitterlings, fatback, hot dogs, bratwurst, and packaged luncheon meats.  Dairy  Whole or 2% milk, cream, half-and-half, and cream cheese. Whole-fat or sweetened yogurt. Full-fat cheeses or blue cheese. Nondairy creamers and whipped toppings. Processed cheese, cheese spreads, or   cheese curds.  Condiments  Onion and garlic salt, seasoned salt, table salt, and sea salt. Canned and packaged gravies. Worcestershire sauce. Tartar sauce. Barbecue sauce. Teriyaki sauce. Soy sauce, including reduced sodium. Steak sauce. Fish sauce. Oyster sauce. Cocktail sauce. Horseradish. Ketchup and mustard. Meat flavorings and tenderizers. Bouillon cubes. Hot sauce. Tabasco sauce. Marinades. Taco seasonings. Relishes.  Fats and Oils Butter, stick margarine, lard, shortening and bacon fat. Coconut, palm kernel, or palm oils. Regular salad dressings.  Pickles and olives. Salted popcorn and pretzels.  The items listed above may not be a complete list of foods and beverages to avoid.   

## 2015-08-11 NOTE — Progress Notes (Signed)
Patient ID: [name], male   DOB: 02-Apr-1934, 82 y.o.   MRN: [id number]   This very nice MWM w/history of HTN, HLD and ASCAD, preDM who recently underwent emergent CABG on 07/06/2015 by Dr Lawson Fiscal and patient presents today with postural orthostasis with sitting BP 106/60 dropping to standing BP 84/56 with c/o near faint. He recently had been rx'd Midodrine and is w/o significant response. Patient denies any HA, CP, palpitations, dyspnea, N/V, or diaphoresis.    Patient is treated for HTN since 1995 and had his 1st PTCA in 2005 and then a 2sd PTCA w/Stent in 2011 and had done well until his recent presentation with NSTEMI and subsequent CABG. & BP has been controlled at home.    Hyperlipidemia has  controlled with diet & meds. Patient denies myalgias or other med SE's. Last Lipids were near  goal with Cholesterol 156; HDL 25*;  And elevated LDL 103*; Triglycerides 140 on 07/02/2015.    Also, the patient has history of PreDiabetes predating since 2008 controlled with diet with A1c 6.1% in 2011 and dropping to 5.8% in 2013 and 5.9% in 2014.  He has had no symptoms of reactive hypoglycemia, diabetic polys, paresthesias or visual blurring.  Last A1c was 6.2% on  07/05/2015.    Further, the patient also has history of Vitamin D Deficiency and supplements vitamin D without any suspected side-effects. Last vitamin D was 72 on 10/13/2014.    Medication Sig  . Acetaminophen (TYLENOL PO) Take 2 tablets by mouth as needed.  Marland Kitchen aspirin EC 81 MG EC tablet Take 1 tablet (81 mg total) by mouth daily.  Marland Kitchen atenolol  25 MG tablet Take 1 tablet (25 mg total) by mouth daily.  . DULCOLAX Take 1 capsule by mouth as needed.  Marland Kitchen CALCIUM PO Take 1 tablet by mouth daily.  . Flaxseed, Linseed, 1000 MG CAPS Take 1,000 mg by mouth daily.   Marland Kitchen gabapentin  300 MG capsule Take 300 mg by mouth daily.  . Magnesium 250 MG TABS Take 250 mg by mouth daily.  Marland Kitchen CITRUCEL  Take 1 Dose by mouth 2 (two) times daily.  . multivitamin  (THERAGRAN) per tablet Take 1 tablet by mouth daily.    Marland Kitchen omeprazole (PRILOSEC) 20 MG capsule Take 20 mg by mouth daily as needed.  . pravastatin (PRAVACHOL) 40 MG tablet Take 1 tablet (40 mg total) by mouth daily at 6 PM.  . clopidogrel (PLAVIX) 75 MG tablet Take 1 tablet (75 mg total) by mouth daily.   Allergies  Allergen Reactions  . Cymbalta [Duloxetine Hcl]     Dizziness  . Keflex [Cephalexin] Other (See Comments)    Reaction: unknown   . Simvastatin Other (See Comments)    Reaction: unknown   . Sudafed [Pseudoephedrine]     Dizziness  . Prednisone Rash   PMHx:   Past Medical History  Diagnosis Date  . Coronary atherosclerosis of unspecified type of vessel, native or graft   . 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 Personal history of other diseases of digestive system   . Other and unspecified hyperlipidemia   . Colon polyps   . Gastritis, chronic   . IBS (irritable bowel syndrome)   . Hypertension   . Diabetes mellitus     Diet control   . Vitamin D deficiency   . Vitamin B 12 deficiency   . Esophageal reflux    Immunization History  Administered Date(s) Administered  . Influenza, High Dose Seasonal  PF 12/30/2013, 02/02/2015  . Influenza-Unspecified 01/13/2013  . Pneumococcal Conjugate-13 02/14/2014  . Pneumococcal-Unspecified 06/15/2004  . Tetanus 10/01/2012  . Zoster 10/01/2012   Past Surgical History  Procedure Laterality Date  . Knee surgery    . Bladder surgery    . Coronary angioplasty with stent placement    . Cardiac catheterization N/A 07/01/2015    Procedure: Left Heart Cath and Coronary Angiography;  Surgeon: Wellington Hampshire, MD;  Location: [facility];  Service: Cardiovascular;  Laterality: N/A;  . Coronary artery bypass graft N/A 07/06/2015    Procedure: CORONARY ARTERY BYPASS GRAFTING (CABG)x 4   utilizing the left internal mammary artery and endoscopically harvested bilateral  sapheneous vein.;  Surgeon: Ivin Poot, MD;  Location: [facility];   Service: Open Heart Surgery;  Laterality: N/A;  . Tee without cardioversion N/A 07/06/2015    Procedure: TRANSESOPHAGEAL ECHOCARDIOGRAM (TEE);  Surgeon: Ivin Poot, MD;  Location: [facility];  Service: Open Heart Surgery;  Laterality: N/A;   FHx:    Reviewed / unchanged  SHx:    Reviewed / unchanged  Systems Review:  Constitutional: Denies fever, chills, wt changes, headaches, insomnia, fatigue, night sweats, change in appetite. Eyes: Denies redness, blurred vision, diplopia, discharge, itchy, watery eyes.  ENT: Denies discharge, congestion, post nasal drip, epistaxis, sore throat, earache, hearing loss, dental pain, tinnitus, vertigo, sinus pain, snoring.  CV: Denies chest pain, palpitations, irregular heartbeat, syncope, dyspnea, diaphoresis, orthopnea, PND, claudication or edema. Respiratory: denies cough, dyspnea, DOE, pleurisy, hoarseness, laryngitis, wheezing.  Gastrointestinal: Denies dysphagia, odynophagia, heartburn, reflux, water brash, abdominal pain or cramps, nausea, vomiting, bloating, diarrhea, constipation, hematemesis, melena, hematochezia  or hemorrhoids. Genitourinary: Denies dysuria, frequency, urgency, nocturia, hesitancy, discharge, hematuria or flank pain. Musculoskeletal: Denies arthralgias, myalgias, stiffness, jt. swelling, pain, limping or strain/sprain.  Skin: Denies pruritus, rash, hives, warts, acne, eczema or change in skin lesion(s). Neuro: No weakness, tremor, incoordination, spasms, paresthesia or pain. Psychiatric: Denies confusion, memory loss or sensory loss. Endo: Denies change in weight, skin or hair change.  Heme/Lymph: No excessive bleeding, bruising or enlarged lymph nodes.  Physical Exam  BP 106/60 mmHg  Pulse 68  Temp(Src) 97.6 F (36.4 C)  Resp 16  Ht 6' 0.5" (1.842 m)  Wt 187 lb (84.823 kg)  BMI 25.00 kg/m2  sitting BP 106/60 dropping to standing BP 84/56 Appears well nourished and in no distress.  Eyes: PERRLA, EOMs, conjunctiva no  swelling or erythema. Sinuses: No frontal/maxillary tenderness ENT/Mouth: EAC's clear, TM's nl w/o erythema, bulging. Nares clear w/o erythema, swelling, exudates. Oropharynx clear without erythema or exudates. Oral hygiene is good. Tongue normal, non obstructing. Hearing intact.  Neck: Supple. Thyroid nl. Car 2+/2+ without bruits, nodes or JVD. Chest: Respirations nl with BS clear & equal w/o rales, rhonchi, wheezing or stridor.  Cor: Heart sounds normal w/ regular rate and rhythm without sig. murmurs, gallops, clicks, or rubs. Peripheral pulses normal and equal  without edema.   Lymphatics: Unremarkable.  Musculoskeletal: Full ROM all peripheral extremities, joint stability, 5/5 strength, and normal gait.  Skin: Warm, dry without exposed rashes, lesions or ecchymosis apparent.  Neuro: Cranial nerves intact, reflexes equal bilaterally. Sensory-motor testing grossly intact. Tendon reflexes grossly intact. .  Assessment and Plan:  1. Orthostatic hypotension  - fludrocortisone (FLORINEF) 0.1 MG tablet; Take 1 tablet 2 x day for low BP  Dispense: 60 tablet; Refill: 6  - monitor postural BP's at home and ROV in 1 week for re-check.  - TSH -  CBC with Differential/Platelet - BASIC METABOLIC PANEL WITH GFR - Hepatic function panel - Magnesium  2. Hyperlipidemia    3. Prediabetes  - Insulin, random  4. Vitamin D deficiency  - VITAMIN D 25 Hydroxy    Recommended regular exercise, BP monitoring, weight control, and discussed med and SE's. Recommended labs to assess and monitor clinical status. Further disposition pending results of labs. Over 20 minutes of exam, counseling, chart review and high complex critical decision making was performed.

## 2015-08-12 LAB — INSULIN, RANDOM: Insulin: 7.1 u[IU]/mL (ref 2.0–19.6)

## 2015-08-12 LAB — VITAMIN D 25 HYDROXY (VIT D DEFICIENCY, FRACTURES): Vit D, 25-Hydroxy: 75 ng/mL (ref 30–100)

## 2015-08-13 ENCOUNTER — Telehealth (HOSPITAL_COMMUNITY): Payer: Self-pay | Admitting: *Deleted

## 2015-08-13 DIAGNOSIS — Z48812 Encounter for surgical aftercare following surgery on the circulatory system: Secondary | ICD-10-CM | POA: Diagnosis not present

## 2015-08-13 DIAGNOSIS — I739 Peripheral vascular disease, unspecified: Secondary | ICD-10-CM | POA: Diagnosis not present

## 2015-08-13 DIAGNOSIS — E559 Vitamin D deficiency, unspecified: Secondary | ICD-10-CM | POA: Diagnosis not present

## 2015-08-13 DIAGNOSIS — I10 Essential (primary) hypertension: Secondary | ICD-10-CM | POA: Diagnosis not present

## 2015-08-13 DIAGNOSIS — I714 Abdominal aortic aneurysm, without rupture: Secondary | ICD-10-CM | POA: Diagnosis not present

## 2015-08-13 DIAGNOSIS — E539 Vitamin B deficiency, unspecified: Secondary | ICD-10-CM | POA: Diagnosis not present

## 2015-08-13 DIAGNOSIS — E785 Hyperlipidemia, unspecified: Secondary | ICD-10-CM | POA: Diagnosis not present

## 2015-08-13 DIAGNOSIS — Z7982 Long term (current) use of aspirin: Secondary | ICD-10-CM | POA: Diagnosis not present

## 2015-08-13 DIAGNOSIS — Z95818 Presence of other cardiac implants and grafts: Secondary | ICD-10-CM | POA: Diagnosis not present

## 2015-08-13 DIAGNOSIS — I2510 Atherosclerotic heart disease of native coronary artery without angina pectoris: Secondary | ICD-10-CM | POA: Diagnosis not present

## 2015-08-13 DIAGNOSIS — K219 Gastro-esophageal reflux disease without esophagitis: Secondary | ICD-10-CM | POA: Diagnosis not present

## 2015-08-13 DIAGNOSIS — Z7901 Long term (current) use of anticoagulants: Secondary | ICD-10-CM | POA: Diagnosis not present

## 2015-08-13 NOTE — Telephone Encounter (Signed)
Received ok from the surgeon office to contact pt for cardiac rehab. Called and spoke to pt.  Pt recently seen in the office by primary MD due to symptomatic low bp readings when changing to standing position.  Changes were made at this pt but pt still has some orthostatic changes. Pt has followup appointment with Dr. Melford Aase next week on the 4/18.  Advised pt to hold cardiac rehab for right now.  Plan to talk back with pt after the appt on 4/18 to assess readiness to attend cardiac rehab.  Pt also given CPT code to determine benefits for cardiac rehab with [facility].  Pt verbalizes understanding and is in agreement. Cherre Huger, BSN

## 2015-08-17 NOTE — Progress Notes (Signed)
Subjective:    Patient ID: [name], male    DOB: 02/16/1934, 82 y.o.   MRN: [id number]  HPI   Patient returns for 1 week f/u after starting Florinef 0.1 mg bid for Postural Hypotension & weight is up 5 # in the last week . Pertinent hx is recent NSTEMI  & emergent CABG on 07/06/2015 by Dr Lawson Fiscal. Patient's HTN predates since 1995 and then PCA in 2005 and Stenting in 2011. He still reports occasional postural drop in BP's with standing and this am standing BP was 82/45 and he felt light-headed. He is aware to caution for falling.   Medication Sig  . Acetaminophen (TYLENOL ) Take 2 tablets by mouth as needed.  Marland Kitchen aspirin EC 81 MG EC  Take 1 tablet (81 mg total) by mouth daily.  Marland Kitchen atenolol 25 MG Take 1 tablet (25 mg total) by mouth daily.  . DULCOLAX Take 1 capsule by mouth as needed.  Marland Kitchen CALCIUM  Take 1 tablet by mouth daily.  Marland Kitchen Clopidogrel 75 MG TK 1 T PO QD  . Flaxseed 1000 MG  Take 1,000 mg by mouth daily.   . fludrocortisone (FLORINEF) 0.1 MG  Take 1 tablet 2 x day for low BP  . Gabapentin 300 MG capsule Take 300 mg by mouth daily.  . Magnesium 250 MG TABS Take 250 mg by mouth daily.  Marland Kitchen CITRUCEL Take 1 Dose by mouth 2 (two) times daily.  . midodrine (PROAMATINE) 5 MG  Take 5 mg by mouth 2 (two) times daily with a meal. - d/c'd  . multivitamin  Take 1 tablet by mouth daily.    Marland Kitchen omeprazole  20 MG  Take 20 mg by mouth daily as needed.  . pravastatin 40 MG  Take 1 tablet (40 mg total) by mouth daily at 6 PM.   Allergies  Allergen Reactions  . Cymbalta [Duloxetine Hcl]     Dizziness  . Keflex [Cephalexin] Other (See Comments)    Reaction: unknown   . Simvastatin Other (See Comments)    Reaction: unknown   . Sudafed [Pseudoephedrine]     Dizziness  . Prednisone Rash   Past Medical History  Diagnosis Date  . Coronary atherosclerosis of unspecified type of vessel, native or graft   . 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 Personal history of other diseases of digestive system    . Other and unspecified hyperlipidemia   . Colon polyps   . Gastritis, chronic   . IBS (irritable bowel syndrome)   . Hypertension   . Diabetes mellitus     Diet control   . Vitamin D deficiency   . Vitamin B 12 deficiency   . Esophageal reflux    Past Surgical History  Procedure Laterality Date  . Knee surgery    . Bladder surgery    . Coronary angioplasty with stent placement    . Cardiac catheterization N/A 07/01/2015    Procedure: Left Heart Cath and Coronary Angiography;  Surgeon: Wellington Hampshire, MD;  Location: [facility];  Service: Cardiovascular;  Laterality: N/A;  . Coronary artery bypass graft N/A 07/06/2015    Procedure: CORONARY ARTERY BYPASS GRAFTING (CABG)x 4   utilizing the left internal mammary artery and endoscopically harvested bilateral  sapheneous vein.;  Surgeon: Ivin Poot, MD;  Location: [facility];  Service: Open Heart Surgery;  Laterality: N/A;  . Tee without cardioversion N/A 07/06/2015    Procedure: TRANSESOPHAGEAL ECHOCARDIOGRAM (TEE);  Surgeon: Tharon Aquas  Kerby Less, MD;  Location: [facility];  Service: Open Heart Surgery;  Laterality: N/A;   Review of Systems     Objective:   Physical Exam  BP 124/76 mmHg  Pulse 68  Temp(Src) 97.7 F (36.5 C)  Resp 16  Ht 6' 0.5" (1.842 m)  Wt 192 lb 9.6 oz (87.363 kg)  BMI 25.75 kg/m2  Sitting BP 136/77 & P 68 and Standing BP 116/68 & P 73  Skin - appears to have a superficial Skin ca of the R temple and also Vertex of scalp.   HEENT - Eac's patent. TM's Nl. EOM's full. PERRLA. NasoOroPharynx clear. Neck - supple. Nl Thyroid. Carotids 2+ & No bruits, nodes, JVD Chest - Clear equal BS w/o Rales, rhonchi, wheezes. Cor - Nl HS. RRR w/o sig MGR. PP 1(+). No edema. MS- FROM w/o deformities. Muscle power, tone and bulk Nl. Gait Nl. Neuro - No obvious Cr N abnormalities. Sensory, motor and Cerebellar functions appear Nl w/o focal abnormalities.    Assessment & Plan:   1. Orthostatic hypotension  -Continue meds  same & encouraged liberal salt intake - ROV 1 week to recheck  2. ASHD, S/P CABG x 4V (Mar 2017)    3. Medication management  - CBC with Differential/Platelet - BASIC METABOLIC PANEL WITH GFR - Magnesium  4. Suspect Skin Ca, R temple & Vertex scalp  - encouraged to restart 5-FU cream bid to the 2 locations x 2-4 weeks.

## 2015-08-17 NOTE — Progress Notes (Deleted)
Patient ID: LYNK LABERGE, male   DOB: 1934-04-25, 82 y.o.   MRN: [id number]

## 2015-08-18 ENCOUNTER — Ambulatory Visit (INDEPENDENT_AMBULATORY_CARE_PROVIDER_SITE_OTHER): Payer: Medicare Other | Admitting: Internal Medicine

## 2015-08-18 ENCOUNTER — Encounter: Payer: Self-pay | Admitting: Internal Medicine

## 2015-08-18 VITALS — BP 124/76 | HR 68 | Temp 97.7°F | Resp 16 | Ht 72.5 in | Wt 192.6 lb

## 2015-08-18 DIAGNOSIS — I951 Orthostatic hypotension: Secondary | ICD-10-CM | POA: Diagnosis not present

## 2015-08-18 DIAGNOSIS — Z951 Presence of aortocoronary bypass graft: Secondary | ICD-10-CM

## 2015-08-18 DIAGNOSIS — Z79899 Other long term (current) drug therapy: Secondary | ICD-10-CM | POA: Diagnosis not present

## 2015-08-18 LAB — CBC WITH DIFFERENTIAL/PLATELET
Basophils Absolute: 0 cells/uL (ref 0–200)
Basophils Relative: 0 %
Eosinophils Absolute: 320 cells/uL (ref 15–500)
Eosinophils Relative: 4 %
HCT: 34.4 % — ABNORMAL LOW (ref 38.5–50.0)
Hemoglobin: 11.1 g/dL — ABNORMAL LOW (ref 13.2–17.1)
Lymphocytes Relative: 35 %
Lymphs Abs: 2800 cells/uL (ref 850–3900)
MCH: 30.2 pg (ref 27.0–33.0)
MCHC: 32.3 g/dL (ref 32.0–36.0)
MCV: 93.5 fL (ref 80.0–100.0)
MPV: 8.9 fL (ref 7.5–12.5)
Monocytes Absolute: 560 cells/uL (ref 200–950)
Monocytes Relative: 7 %
Neutro Abs: 4320 cells/uL (ref 1500–7800)
Neutrophils Relative %: 54 %
Platelets: 282 10*3/uL (ref 140–400)
RBC: 3.68 MIL/uL — ABNORMAL LOW (ref 4.20–5.80)
RDW: 13.7 % (ref 11.0–15.0)
WBC: 8 10*3/uL (ref 3.8–10.8)

## 2015-08-18 LAB — BASIC METABOLIC PANEL WITH GFR
BUN: 13 mg/dL (ref 7–25)
CO2: 29 mmol/L (ref 20–31)
Calcium: 8.9 mg/dL (ref 8.6–10.3)
Chloride: 105 mmol/L (ref 98–110)
Creat: 1.15 mg/dL — ABNORMAL HIGH (ref 0.70–1.11)
GFR, Est African American: 69 mL/min (ref >=60)
GFR, Est Non African American: 60 mL/min (ref >=60)
Glucose, Bld: 96 mg/dL (ref 65–99)
Potassium: 4.4 mmol/L (ref 3.5–5.3)
Sodium: 143 mmol/L (ref 135–146)

## 2015-08-18 LAB — MAGNESIUM: Magnesium: 2.1 mg/dL (ref 1.5–2.5)

## 2015-08-19 DIAGNOSIS — E785 Hyperlipidemia, unspecified: Secondary | ICD-10-CM | POA: Diagnosis not present

## 2015-08-19 DIAGNOSIS — Z7982 Long term (current) use of aspirin: Secondary | ICD-10-CM | POA: Diagnosis not present

## 2015-08-19 DIAGNOSIS — I10 Essential (primary) hypertension: Secondary | ICD-10-CM | POA: Diagnosis not present

## 2015-08-19 DIAGNOSIS — Z48812 Encounter for surgical aftercare following surgery on the circulatory system: Secondary | ICD-10-CM | POA: Diagnosis not present

## 2015-08-19 DIAGNOSIS — I739 Peripheral vascular disease, unspecified: Secondary | ICD-10-CM | POA: Diagnosis not present

## 2015-08-19 DIAGNOSIS — E539 Vitamin B deficiency, unspecified: Secondary | ICD-10-CM | POA: Diagnosis not present

## 2015-08-19 DIAGNOSIS — K219 Gastro-esophageal reflux disease without esophagitis: Secondary | ICD-10-CM | POA: Diagnosis not present

## 2015-08-19 DIAGNOSIS — E559 Vitamin D deficiency, unspecified: Secondary | ICD-10-CM | POA: Diagnosis not present

## 2015-08-19 DIAGNOSIS — I714 Abdominal aortic aneurysm, without rupture: Secondary | ICD-10-CM | POA: Diagnosis not present

## 2015-08-19 DIAGNOSIS — I2510 Atherosclerotic heart disease of native coronary artery without angina pectoris: Secondary | ICD-10-CM | POA: Diagnosis not present

## 2015-08-19 DIAGNOSIS — Z7901 Long term (current) use of anticoagulants: Secondary | ICD-10-CM | POA: Diagnosis not present

## 2015-08-19 DIAGNOSIS — Z95818 Presence of other cardiac implants and grafts: Secondary | ICD-10-CM | POA: Diagnosis not present

## 2015-08-25 ENCOUNTER — Encounter: Payer: Self-pay | Admitting: Internal Medicine

## 2015-08-25 ENCOUNTER — Ambulatory Visit (INDEPENDENT_AMBULATORY_CARE_PROVIDER_SITE_OTHER): Payer: Medicare Other | Admitting: Internal Medicine

## 2015-08-25 VITALS — BP 162/84 | HR 72 | Temp 97.3°F | Resp 16 | Ht 72.5 in | Wt 192.2 lb

## 2015-08-25 DIAGNOSIS — I951 Orthostatic hypotension: Secondary | ICD-10-CM

## 2015-08-25 DIAGNOSIS — Z79899 Other long term (current) drug therapy: Secondary | ICD-10-CM

## 2015-08-25 DIAGNOSIS — I10 Essential (primary) hypertension: Secondary | ICD-10-CM

## 2015-08-25 LAB — BASIC METABOLIC PANEL WITH GFR
BUN: 12 mg/dL (ref 7–25)
CO2: 31 mmol/L (ref 20–31)
Calcium: 9.2 mg/dL (ref 8.6–10.3)
Chloride: 105 mmol/L (ref 98–110)
Creat: 1.08 mg/dL (ref 0.70–1.11)
GFR, Est African American: 75 mL/min (ref >=60)
GFR, Est Non African American: 64 mL/min (ref >=60)
Glucose, Bld: 107 mg/dL — ABNORMAL HIGH (ref 65–99)
Potassium: 4.1 mmol/L (ref 3.5–5.3)
Sodium: 142 mmol/L (ref 135–146)

## 2015-08-25 NOTE — Progress Notes (Signed)
Subjective:    Patient ID: [name], male    DOB: [birth year], 82 y.o.   MRN: [id number]  HPI This nice 82 yo MWM s/p recent NSTEMI/CABG on Mar 6th returns for 2sd weekly  f/u after starting Florinef bid for postural hypotension. Last week were improved and BMET was normal. Patient reports still having a significant postural drop, but at least standing BP's are trending about [id number] systolic & improved over pretreatment standing Sys BP's in the low 80's. Today's orthostatic BP does show sitting hypertension of 162/84 and standing BP 116/76 with pulse rising from 72 to 98. Patient also is c/o soreness of this left para-sternal chest & ribs noted mostly with recumbency. He also notes occasional isolated palpitations at rest, but no salvos. Denies HA, exertional CP, PND/Orthopnea or edema.   Medication Sig  . TYLENOL Take 2 tablets by mouth as needed.  Marland Kitchen aspirin EC 81 MG EC Take 1 tablet (81 mg total) by mouth daily.  Marland Kitchen atenolol  25 MG tablet Take 1 tablet (25 mg total) by mouth daily.  . DULCOLAX Take 1 capsule by mouth as needed.  Marland Kitchen CALCIUM PO Take 1 tablet by mouth daily.  . Flaxseed 1000 MG  Take 1,000 mg by mouth daily.   Marland Kitchen FLORINEF 0.1 MG Take 1 tablet 2 x day for low BP  . gabapentin  300 MG Take 300 mg by mouth daily.  . Magnesium 250 MG  Take 250 mg by mouth daily.  . multivitamin  Take 1 tablet by mouth daily.    Marland Kitchen omeprazole  20 MG  Take 20 mg by mouth daily as needed.  . pravastatin  40 MG  Take 1 tablet (40 mg total) by mouth daily at 6 PM.  . CITRUCEL Take 1 Dose by mouth 2 (two) times daily.   Allergies  Allergen Reactions  . Cymbalta [Duloxetine Hcl]     Dizziness  . Keflex [Cephalexin] Other (See Comments)    Reaction: unknown   . Simvastatin Other (See Comments)    Reaction: unknown   . Sudafed [Pseudoephedrine]     Dizziness  . Prednisone Rash   Past Medical History  Diagnosis Date  . Coronary atherosclerosis of unspecified type of vessel, native or graft    . 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 Personal history of other diseases of digestive system   . Other and unspecified hyperlipidemia   . Colon polyps   . Gastritis, chronic   . IBS (irritable bowel syndrome)   . Hypertension   . Diabetes mellitus     Diet control   . Vitamin D deficiency   . Vitamin B 12 deficiency   . Esophageal reflux    Past Surgical History  Procedure Laterality Date  . Knee surgery    . Bladder surgery    . Coronary angioplasty with stent placement    . Cardiac catheterization N/A 07/01/2015    Procedure: Left Heart Cath and Coronary Angiography;  Surgeon: Wellington Hampshire, MD;  Location: [facility];  Service: Cardiovascular;  Laterality: N/A;  . Coronary artery bypass graft N/A 07/06/2015    Procedure: CORONARY ARTERY BYPASS GRAFTING (CABG)x 4   utilizing the left internal mammary artery and endoscopically harvested bilateral  sapheneous vein.;  Surgeon: Ivin Poot, MD;  Location: [facility];  Service: Open Heart Surgery;  Laterality: N/A;  . Tee without cardioversion N/A 07/06/2015    Procedure: TRANSESOPHAGEAL ECHOCARDIOGRAM (TEE);  Surgeon: Ivin Poot, MD;  Location: [facility];  Service: Open Heart Surgery;  Laterality: N/A;   Review of Systems  10 point systems review negative except as above.    Objective:   Physical Exam  BP 162/84, P 72 sitting   BP 116/76, P 98 standing  Temp 97.3 F   Resp 16  Ht 6'   Wt 192 lb    BMI 25.69  HEENT - Eac's patent. TM's Nl. EOM's full. PERRLA. NasoOroPharynx clear. Neck - supple. Nl Thyroid. Carotids 2+ & No bruits, nodes, JVD Chest - Healed median sternotomy scar with tender sternum and Left 2sd & 4th  Para sternal ribs to sl pressure. Clear equal BS w/o Rales, rhonchi, wheezes. Cor - Nl HS. RRR w/o sig MGR. (only 1 ES appreciated in 90 msec).  PP 1(+). 1+ pretibial edema. Abd - Soft & benign MS- FROM w/o deformities. Muscle power, tone and bulk Nl. Gait Nl. Neuro - No obvious Cr N abnormalities.  Sensory, motor and Cerebellar functions appear Nl w/o focal abnormalities.    Assessment & Plan:   1. Essential hypertension  - Advised patient & wife to continue postural BP monitoring and that it's anticipated to show some "suppine" HTN in this situation.  - Still advised fainting precautions with rapid standing  2. Orthostatic hypotension   3. Medication management  - BASIC METABOLIC PANEL WITH GFR - ROV 1 month to continue electrolyte, edema & BP monitoring

## 2015-08-25 NOTE — Patient Instructions (Signed)

## 2015-09-06 ENCOUNTER — Other Ambulatory Visit: Payer: Self-pay | Admitting: Surgical

## 2015-09-07 ENCOUNTER — Other Ambulatory Visit: Payer: Self-pay | Admitting: Internal Medicine

## 2015-09-08 ENCOUNTER — Telehealth: Payer: Self-pay | Admitting: *Deleted

## 2015-09-08 NOTE — Telephone Encounter (Signed)
Patient called and reported his BP at 181/88 and later at 156/81.  Per Dr Jones Broom, reduce the Florinef 0.1 mg tablet to 1 tablet daily.  Patient advised.

## 2015-09-09 NOTE — Progress Notes (Signed)
HPI: FU CAD h/o HTN, HLD and borderline DM. Abd ultrasound 1/17 showed infrarenal fusiform AAA measuring 3.2 cm x 2.8 cm; bilateral common iliac artery aneurysms measuring 1.6 cm x 2.2 cm on the right and 1.4 cm x 2.2 cm on the left. FU one year. He presented to the ED 07/01/15 with chest pain and elevated Troponin. Cath revealed 3 V CAD, echo showed normal LVF. Preoperative evaluation included carotids and ABIs that were normal. He underwent CABG x 4 on 07/07/15. Post op course was unremarkable. He has had difficulties with orthostasis and hypotension since discharge. Started on midodrine by CVTS. Since last seen, There is no dyspnea, chest pain or syncope. He states midodrine did not help with his orthostasis. He was therefore placed on Florinef. His blood pressure has improved although he still has some dizziness. He has developed headache as well.   Current Outpatient Prescriptions  Medication Sig Dispense Refill  . Acetaminophen (TYLENOL PO) Take 2 tablets by mouth as needed.    Marland Kitchen aspirin EC 81 MG EC tablet Take 1 tablet (81 mg total) by mouth daily.    Marland Kitchen atenolol (TENORMIN) 25 MG tablet Take 1 tablet (25 mg total) by mouth daily. (Patient taking differently: Take 12.5 mg by mouth daily. ) 30 tablet 11  . Bisacodyl (DULCOLAX PO) Take 1 capsule by mouth as needed.    Marland Kitchen CALCIUM PO Take 1 tablet by mouth daily.    . Flaxseed, Linseed, 1000 MG CAPS Take 1,000 mg by mouth daily.     . fludrocortisone (FLORINEF) 0.1 MG tablet Take 1 tablet 2 x day for low BP (Patient taking differently: Take 0.1 mg by mouth daily. ) 60 tablet 6  . gabapentin (NEURONTIN) 300 MG capsule Take 300 mg by mouth daily.    . Magnesium 250 MG TABS Take 250 mg by mouth daily.    . multivitamin (THERAGRAN) per tablet Take 1 tablet by mouth daily.      Marland Kitchen omeprazole (PRILOSEC) 20 MG capsule Take 20 mg by mouth daily as needed.    . ONE TOUCH LANCETS MISC check blood sugar 1 time daily-[id number]. 200 each 0  . ONE TOUCH  ULTRA TEST test strip CHECK BLOOD GLUCOSE 1 TIME DAILY 100 each 12  . pravastatin (PRAVACHOL) 40 MG tablet TAKE 1 TABLET BY MOUTH EVERY DAY AT 6PM 30 tablet 3   No current facility-administered medications for this visit.     Past Medical History  Diagnosis Date  . Coronary atherosclerosis of unspecified type of vessel, native or graft   . 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 Personal history of other diseases of digestive system   . Other and unspecified hyperlipidemia   . Colon polyps   . Gastritis, chronic   . IBS (irritable bowel syndrome)   . Hypertension   . Diabetes mellitus     Diet control   . Vitamin D deficiency   . Vitamin B 12 deficiency   . Esophageal reflux     Past Surgical History  Procedure Laterality Date  . Knee surgery    . Bladder surgery    . Coronary angioplasty with stent placement    . Cardiac catheterization N/A 07/01/2015    Procedure: Left Heart Cath and Coronary Angiography;  Surgeon: Wellington Hampshire, MD;  Location: [facility];  Service: Cardiovascular;  Laterality: N/A;  . Coronary artery bypass graft N/A 07/06/2015    Procedure: CORONARY ARTERY BYPASS GRAFTING (CABG)x 4  utilizing the left internal mammary artery and endoscopically harvested bilateral  sapheneous vein.;  Surgeon: Ivin Poot, MD;  Location: [facility];  Service: Open Heart Surgery;  Laterality: N/A;  . Tee without cardioversion N/A 07/06/2015    Procedure: TRANSESOPHAGEAL ECHOCARDIOGRAM (TEE);  Surgeon: Ivin Poot, MD;  Location: [facility];  Service: Open Heart Surgery;  Laterality: N/A;    Social History   Social History  . Marital Status: Married    Spouse Name: N/A  . Number of Children: 4   . Years of Education: N/A   Occupational History  . Retired    Social History Main Topics  . Smoking status: Former Smoker    Quit date: 07/16/1963  . Smokeless tobacco: Never Used  . Alcohol Use: No  . Drug Use: No  . Sexual Activity: Not on file   Other Topics Concern    . Not on file   Social History Narrative   1 caffeine drink daily     Family History  Problem Relation Age of Onset  . Heart attack Father     died age 103  . Heart attack Brother     died age 39  . Heart attack Sister     died age 77  . Colon cancer Sister   . Liver cancer Sister   . Diabetes Maternal Grandmother   . Colon polyps Sister     and brothers x 2     ROS: no fevers or chills, productive cough, hemoptysis, dysphasia, odynophagia, melena, hematochezia, dysuria, hematuria, rash, seizure activity, orthopnea, PND, pedal edema, claudication. Remaining systems are negative.  Physical Exam: Well-developed well-nourished in no acute distress.  Skin is warm and dry.  HEENT is normal.  Neck is supple.  Chest is clear to auscultation with normal expansion.  Cardiovascular exam is regular rate and rhythm.  Abdominal exam nontender or distended. No masses palpated. Extremities show no edema. neuro grossly intact

## 2015-09-14 ENCOUNTER — Ambulatory Visit (INDEPENDENT_AMBULATORY_CARE_PROVIDER_SITE_OTHER): Payer: Medicare Other | Admitting: Cardiology

## 2015-09-14 ENCOUNTER — Encounter: Payer: Self-pay | Admitting: Cardiology

## 2015-09-14 VITALS — BP 170/100 | HR 73 | Ht 72.0 in | Wt 195.0 lb

## 2015-09-14 DIAGNOSIS — I951 Orthostatic hypotension: Secondary | ICD-10-CM

## 2015-09-14 DIAGNOSIS — Z951 Presence of aortocoronary bypass graft: Secondary | ICD-10-CM

## 2015-09-14 DIAGNOSIS — E782 Mixed hyperlipidemia: Secondary | ICD-10-CM | POA: Diagnosis not present

## 2015-09-14 NOTE — Assessment & Plan Note (Signed)
Follow-up ultrasound January 2018.

## 2015-09-14 NOTE — Patient Instructions (Signed)
Medication Instructions:   STOP FLORINEF  Follow-Up:  Your physician recommends that you schedule a follow-up appointment in: [facility]  Your physician recommends that you schedule a follow-up appointment in: [place]

## 2015-09-14 NOTE — Assessment & Plan Note (Signed)
He states midodrine did not help with his orthostasis. Florinef has caused hypertension. He has some dizziness regardless of his blood pressure although worse with standing. We will discontinue Florinef. Increase by mouth fluid intake and sodium intake. I have given him compression hose as well. We will follow him and adjust his regimen as needed. Note I consider discontinuing his atenolol but he has had increased palpitations off beta-blockade in the past.

## 2015-09-14 NOTE — Assessment & Plan Note (Signed)
Continue statin. He did not tolerate Crestor or Lipitor previously.

## 2015-09-14 NOTE — Assessment & Plan Note (Signed)
Continue aspirin and statin. 

## 2015-09-15 ENCOUNTER — Ambulatory Visit (INDEPENDENT_AMBULATORY_CARE_PROVIDER_SITE_OTHER): Payer: Medicare Other | Admitting: Internal Medicine

## 2015-09-15 ENCOUNTER — Encounter: Payer: Self-pay | Admitting: Internal Medicine

## 2015-09-15 VITALS — BP 166/82 | HR 72 | Temp 97.7°F | Resp 16 | Ht 72.5 in | Wt 196.6 lb

## 2015-09-15 DIAGNOSIS — M5481 Occipital neuralgia: Secondary | ICD-10-CM

## 2015-09-15 MED ORDER — TRAMADOL HCL 50 MG PO TABS: ORAL_TABLET | ORAL | Status: DC

## 2015-09-15 MED ORDER — PREDNISONE 20 MG PO TABS: ORAL_TABLET | ORAL | Status: DC

## 2015-09-15 NOTE — Patient Instructions (Signed)
Occipital Neuralgia  Occipital neuralgia is a type of headache that causes episodes of very bad pain in the back of your head. Pain from occipital neuralgia may spread (radiate) to other parts of your head. The pain is usually brief and often goes away after you rest and relax. These headaches may be caused by irritation of the nerves that leave your spinal cord high up in your neck, just below the base of your skull (occipital nerves). Your occipital nerves transmit sensations from the back of your head, the top of your head, and the areas behind your ears. CAUSES Occipital neuralgia can occur without any known cause (primary headache syndrome). In other cases, occipital neuralgia is caused by pressure on or irritation of one of the two occipital nerves. Causes of occipital nerve compression or irritation include:  Wear and tear of the vertebrae in the neck (osteoarthritis).  Neck injury.  Disease of the disks that separate the vertebrae.  Tumors.  Gout.  Infections.  Diabetes.  Swollen blood vessels that put pressure on the occipital nerves.  Muscle spasm in the neck. SIGNS AND SYMPTOMS Pain is the main symptom of occipital neuralgia. It usually starts in the back of the head but may also be felt in other areas supplied by the occipital nerves. Pain is usually on one side but may be on both sides. You may have:   Brief episodes of very bad pain that is burning, stabbing, shocking, or shooting.  Pain behind the eye.  Pain triggered by neck movement or hair brushing.  Scalp tenderness.  Aching in the back of the head between episodes of very bad pain. DIAGNOSIS  Your health care provider may diagnose occipital neuralgia based on your symptoms and a physical exam. During the exam, the health care provider may push on areas supplied by the occipital nerves to see if they are painful. Some tests may also be done to help in making the diagnosis. These may include:  Imaging studies  of the upper spinal cord, such as an MRI or CT scan. These may show compression or spinal cord abnormalities.  Nerve block. You will get an injection of numbing medicine (local anesthetic) near the occipital nerve to see if this relieves pain. TREATMENT  Treatment may begin with simple measures, such as:   Rest.  Massage.  Heat.  Over-the-counter pain relievers. If these measures do not work, you may need other treatments, including:  Medicines such as:  Prescription-strength anti-inflammatory medicines.  Muscle relaxants.  Antiseizure medicines.  Antidepressants.  Steroid injection. This involves injections of local anesthetic and strong anti-inflammatory drugs (steroids).  Pulsed radiofrequency. Wires are implanted to deliver electrical impulses that block pain signals from the occipital nerve.  Physical therapy.  Surgery to relieve nerve pressure. HOME CARE INSTRUCTIONS  Take all medicines as directed by your health care provider.  Avoid activities that cause pain.  Rest when you have an attack of pain.  Try gentle massage or a heating pad to relieve pain.  Work with a physical therapist to learn stretching exercises you can do at home.  Try a different pillow or sleeping position.  Practice good posture.  Try to stay active. Get regular exercise that does not cause pain. Ask your health care provider to suggest safe exercises for you.  Keep all follow-up visits as directed by your health care provider. This is important. SEEK MEDICAL CARE IF:  Your medicine is not working.  You have new or worsening symptoms. [place]  CARE IF:  You have very bad head pain that is not going away.  You have a sudden change in vision, balance, or speech. MAKE SURE YOU:  Understand these instructions.  Will watch your condition.  Will get help right away if you are not doing well or get worse.

## 2015-09-19 ENCOUNTER — Encounter: Payer: Self-pay | Admitting: Internal Medicine

## 2015-09-19 NOTE — Progress Notes (Signed)
Subjective:    Patient ID: [name], male    DOB: January 25, 1934, 82 y.o.   MRN: [id number]  HPI  Patient is a very nice 82 yo MWM s/p recent emergent CABG who had po issues with low BP instability and had been treated with midodrine and subsequently Florinef, both of which have been d/c'd - the latter by Dr Stanford Breed for rising BP's. Now the patient presents with a 3- day hx/o  sharp stabbing pains in the R post auricular area w/o associated rise in BP, Neuro sn's/sx's or hx/o injury or positional neck effect.   Medication Sig  . TYLENOL  Take 2 tablets by mouth as needed.  Marland Kitchen aspirin EC 81 MG EC Take 1 tablet (81 mg total) by mouth daily.  Marland Kitchen atenolol  25 MG Take 1 tablet (25 mg total) by mouth daily. (Patient taking differently: Take 12.5 mg by mouth daily. )  . DULCOLAX  Take 1 capsule by mouth as needed.  Marland Kitchen CALCIUM Take 1 tablet by mouth daily.  . Flaxseed 1000 MG  Take 1,000 mg by mouth daily.   Marland Kitchen gabapentin  300 MG  Take 300 mg by mouth daily.  . Magnesium 250 MG  Take 250 mg by mouth daily.  Alyson Ingles Take 1 tablet by mouth daily.    Marland Kitchen omeprazole  20 MG  Take 20 mg by mouth daily as needed.  . Pravastatin 40 MG  TAKE 1 TABLET BY MOUTH EVERY DAY AT 6PM   Allergies  Allergen Reactions  . Cymbalta [Duloxetine Hcl]     Dizziness  . Keflex [Cephalexin] Other (See Comments)    Reaction: unknown   . Simvastatin Other (See Comments)    Reaction: unknown   . Sudafed [Pseudoephedrine]     Dizziness   Past Medical History  Diagnosis Date  . Coronary atherosclerosis of unspecified type of vessel, native or graft   . 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 Personal history of other diseases of digestive system   . Other and unspecified hyperlipidemia   . Colon polyps   . Gastritis, chronic   . IBS (irritable bowel syndrome)   . Hypertension   . Diabetes mellitus     Diet control   . Vitamin D deficiency   . Vitamin B 12 deficiency   . Esophageal reflux    Past Surgical History   Procedure Laterality Date  . Knee surgery    . Bladder surgery    . Coronary angioplasty with stent placement    . Cardiac catheterization N/A 07/01/2015    Procedure: Left Heart Cath and Coronary Angiography;  Surgeon: Wellington Hampshire, MD;  Location: [facility];  Service: Cardiovascular;  Laterality: N/A;  . Coronary artery bypass graft N/A 07/06/2015    Procedure: CORONARY ARTERY BYPASS GRAFTING (CABG)x 4   utilizing the left internal mammary artery and endoscopically harvested bilateral  sapheneous vein.;  Surgeon: Ivin Poot, MD;  Location: [facility];  Service: Open Heart Surgery;  Laterality: N/A;  . Tee without cardioversion N/A 07/06/2015    Procedure: TRANSESOPHAGEAL ECHOCARDIOGRAM (TEE);  Surgeon: Ivin Poot, MD;  Location: [facility];  Service: Open Heart Surgery;  Laterality: N/A;   Review of Systems  10 point systems review negative except as above.    Objective:   Physical Exam  BP 166/82 mmHg  Pulse 72  Temp(Src) 97.7 F (36.5 C)  Resp 16  Ht 6' 0.5" (1.842 m)  Wt 196 lb 9.6  oz (89.177 kg)  BMI 26.28 kg/m2  HEENT - Eac's & TM's Nl. Exquisite (+) tender trigger point in the R post auricular area not affected by neck position.   EOM's full. PERRLA. NasoOroPharynx clear. Neck - supple. Nl Thyroid. Carotids 2+ & No bruits, nodes, JVD Chest - Clear equal BS w/o Rales, rhonchi, wheezes. Cor - Nl HS. RRR w/o sig MGR. PP 1(+). No edema. MS- FROM w/o deformities. Muscle power, tone and bulk Nl. Gait Nl. Neuro - No obvious Cr N abnormalities. Sensory, motor and Cerebellar functions appear Nl w/o focal abnormalities.    Assessment & Plan:   1. Occipital neuralgia of right side  - predniSONE (DELTASONE) 20 MG tablet; 1 tab 3 x day for 3 days, then 1 tab 2 x day for 3 days, then 1 tab 1 x day for 5 days  Dispense: 20 tablet; Refill: 0 - traMADol (ULTRAM) 50 MG tablet; Take 1 tablet 4 x day if needed for severe pain  Dispense: 60 tablet; Refill: 0  - patient advised of  med effects/SE's. Advised rov for trigger point injection of sx's persist.

## 2015-09-23 ENCOUNTER — Encounter: Payer: Self-pay | Admitting: Internal Medicine

## 2015-09-23 ENCOUNTER — Ambulatory Visit (INDEPENDENT_AMBULATORY_CARE_PROVIDER_SITE_OTHER): Payer: Medicare Other | Admitting: Internal Medicine

## 2015-09-23 VITALS — BP 122/64 | HR 72 | Temp 97.3°F | Resp 16 | Ht 72.5 in | Wt 184.4 lb

## 2015-09-23 DIAGNOSIS — Z79899 Other long term (current) drug therapy: Secondary | ICD-10-CM

## 2015-09-23 DIAGNOSIS — R42 Dizziness and giddiness: Secondary | ICD-10-CM | POA: Diagnosis not present

## 2015-09-23 DIAGNOSIS — I10 Essential (primary) hypertension: Secondary | ICD-10-CM | POA: Diagnosis not present

## 2015-09-23 DIAGNOSIS — M5134 Other intervertebral disc degeneration, thoracic region: Secondary | ICD-10-CM | POA: Diagnosis not present

## 2015-09-23 DIAGNOSIS — M9902 Segmental and somatic dysfunction of thoracic region: Secondary | ICD-10-CM | POA: Diagnosis not present

## 2015-09-23 LAB — CBC WITH DIFFERENTIAL/PLATELET
Basophils Absolute: 0 cells/uL (ref 0–200)
Basophils Relative: 0 %
Eosinophils Absolute: 0 cells/uL — ABNORMAL LOW (ref 15–500)
Eosinophils Relative: 0 %
HCT: 40 % (ref 38.5–50.0)
Hemoglobin: 12.7 g/dL — ABNORMAL LOW (ref 13.2–17.1)
Lymphocytes Relative: 20 %
Lymphs Abs: 2300 cells/uL (ref 850–3900)
MCH: 28.3 pg (ref 27.0–33.0)
MCHC: 31.8 g/dL — ABNORMAL LOW (ref 32.0–36.0)
MCV: 89.3 fL (ref 80.0–100.0)
MPV: 9.2 fL (ref 7.5–12.5)
Monocytes Absolute: 460 cells/uL (ref 200–950)
Monocytes Relative: 4 %
Neutro Abs: 8740 cells/uL — ABNORMAL HIGH (ref 1500–7800)
Neutrophils Relative %: 76 %
Platelets: 321 10*3/uL (ref 140–400)
RBC: 4.48 MIL/uL (ref 4.20–5.80)
RDW: 14.1 % (ref 11.0–15.0)
WBC: 11.5 10*3/uL — ABNORMAL HIGH (ref 3.8–10.8)

## 2015-09-23 MED ORDER — MECLIZINE HCL 25 MG PO TABS: ORAL_TABLET | ORAL | Status: DC

## 2015-09-23 NOTE — Patient Instructions (Signed)

## 2015-09-23 NOTE — Progress Notes (Signed)
Subjective:    Patient ID: [name], male    DOB: October 21, 1933, 82 y.o.   MRN: [id number]   HPI  Patient returns for 10 day f/u for Prednisone pulse tx/o R occipital Neuralgia and is significantly improved but is now c/o sx's of positional Vertigo described as a sensation of "woozy headed and a sense of falling, but not spinning type. Patient underwent emergent CABG in March and post op had issues with postural hypotension and then on Florinef developed labile hypertension and now off of Florinef and only on very low dose Atenolol 12.5 mg, he seems to be stabilized wrt his BP. He has lost 12# since his recent last OV and it's speculated that this is a physiologic diuresis of fluid accumulated on Florinef. Patient denies any Nausea or falling, but is cautious to support ambulation by his wife. Denies any focal neuro sx's.   Medication Sig  . TYLENOL Take 2 tablets by mouth as needed.  Marland Kitchen aspirin EC 81 MG EC Take 1 tablet (81 mg total) by mouth daily.  Marland Kitchen atenolol  25 MG Take 1 tablet (25 mg total) by mouth daily. (Patient taking differently: Take 12.5 mg by mouth daily. )  . DULCOLAX  Take 1 capsule by mouth as needed.  Marland Kitchen CALCIUM PO Take 1 tablet by mouth daily.  . Flaxseed 1000 MG Take 1,000 mg by mouth daily.   Marland Kitchen gabapentin300 MG  Take 300 mg by mouth daily.  . Magnesium 250 MG  Take 250 mg by mouth daily.  . multivitamin Take 1 tablet by mouth daily.    Marland Kitchen omeprazole  20 MG  Take 20 mg by mouth daily as needed.  . pravastatin  40 MG  TAKE 1 TABLET BY MOUTH EVERY DAY AT 6PM  . traMADol  50 MG  Take 1 tablet 4 x day if needed for severe pain    Allergies  Allergen Reactions  . Cymbalta [Duloxetine Hcl]     Dizziness  . Keflex [Cephalexin] Other (See Comments)    Reaction: unknown   . Simvastatin Other (See Comments)    Reaction: unknown   . Sudafed [Pseudoephedrine]     Dizziness   Past Medical History  Diagnosis Date  . Coronary atherosclerosis of unspecified type of vessel,  native or graft   . 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 Personal history of other diseases of digestive system   . Other and unspecified hyperlipidemia   . Colon polyps   . Gastritis, chronic   . IBS (irritable bowel syndrome)   . Hypertension   . Diabetes mellitus     Diet control   . Vitamin D deficiency   . Vitamin B 12 deficiency   . Esophageal reflux    Past Surgical History  Procedure Laterality Date  . Knee surgery    . Bladder surgery    . Coronary angioplasty with stent placement    . Cardiac catheterization N/A 07/01/2015    Procedure: Left Heart Cath and Coronary Angiography;  Surgeon: Wellington Hampshire, MD;  Location: [place] [facility];  Service: Cardiovascular;  Laterality: N/A;  . Coronary artery bypass graft N/A 07/06/2015    Procedure: CORONARY ARTERY BYPASS GRAFTING (CABG)x 4   utilizing the left internal mammary artery and endoscopically harvested bilateral  sapheneous vein.;  Surgeon: Ivin Poot, MD;  Location: [place];  Service: Open Heart Surgery;  Laterality: N/A;  . Tee without cardioversion N/A 07/06/2015    Procedure: TRANSESOPHAGEAL ECHOCARDIOGRAM (  TEE);  Surgeon: Ivin Poot, MD;  Location: [place];  Service: Open Heart Surgery;  Laterality: N/A;   Review of Systems  10 point systems review negative except as above.    Objective:   Physical Exam  BP 122/64 mmHg  Pulse 72  Temp(Src) 97.3 F (36.3 C)  Resp 16  Ht 6' 0.5" (1.842 m)  Wt 184 lb 6.4 oz (83.643 kg)  BMI 24.65 kg/m2  HEENT - Eac's patent. TM's Nl. EOM's full. PERRLA. NasoOroPharynx clear. Neck - supple. Nl Thyroid. Carotids 2+ & No bruits, nodes, JVD Chest - Clear equal BS w/o Rales, rhonchi, wheezes. Cor - Nl HS. RRR w/o sig MGR. PP 1(+). No edema.  MS- FROM w/o deformities. Muscle power, tone and bulk Nl. Gait Nl. Neuro - No obvious Cr N abnormalities. Sensory, motor and Cerebellar functions appear Nl w/o focal abnormalities.    Assessment & Plan:   1. Essential  hypertension   2. Vertigo, intermittent  - meclizine 25 MG tablet; Take 1 tablet 3 x / day for Vertigo  Dispense: 90 tablet; Refill: 2  3. Medication management  - CBC with Differential/Platelet - BASIC METABOLIC PANEL WITH GFR  - discussed meds & SE's.

## 2015-09-24 DIAGNOSIS — M9902 Segmental and somatic dysfunction of thoracic region: Secondary | ICD-10-CM | POA: Diagnosis not present

## 2015-09-24 DIAGNOSIS — M5134 Other intervertebral disc degeneration, thoracic region: Secondary | ICD-10-CM | POA: Diagnosis not present

## 2015-09-24 LAB — BASIC METABOLIC PANEL WITH GFR
BUN: 27 mg/dL — ABNORMAL HIGH (ref 7–25)
CO2: 27 mmol/L (ref 20–31)
Calcium: 9.5 mg/dL (ref 8.6–10.3)
Chloride: 101 mmol/L (ref 98–110)
Creat: 1.16 mg/dL — ABNORMAL HIGH (ref 0.70–1.11)
GFR, Est African American: 68 mL/min (ref >=60)
GFR, Est Non African American: 59 mL/min — ABNORMAL LOW (ref >=60)
Glucose, Bld: 151 mg/dL — ABNORMAL HIGH (ref 65–99)
Potassium: 5.4 mmol/L — ABNORMAL HIGH (ref 3.5–5.3)
Sodium: 139 mmol/L (ref 135–146)

## 2015-09-25 ENCOUNTER — Ambulatory Visit: Payer: Self-pay | Admitting: Internal Medicine

## 2015-09-29 DIAGNOSIS — M9902 Segmental and somatic dysfunction of thoracic region: Secondary | ICD-10-CM | POA: Diagnosis not present

## 2015-09-29 DIAGNOSIS — M5134 Other intervertebral disc degeneration, thoracic region: Secondary | ICD-10-CM | POA: Diagnosis not present

## 2015-10-01 DIAGNOSIS — M5134 Other intervertebral disc degeneration, thoracic region: Secondary | ICD-10-CM | POA: Diagnosis not present

## 2015-10-01 DIAGNOSIS — M9902 Segmental and somatic dysfunction of thoracic region: Secondary | ICD-10-CM | POA: Diagnosis not present

## 2015-10-07 DIAGNOSIS — M9902 Segmental and somatic dysfunction of thoracic region: Secondary | ICD-10-CM | POA: Diagnosis not present

## 2015-10-07 DIAGNOSIS — M5134 Other intervertebral disc degeneration, thoracic region: Secondary | ICD-10-CM | POA: Diagnosis not present

## 2015-10-12 ENCOUNTER — Encounter: Payer: Self-pay | Admitting: Cardiology

## 2015-10-12 ENCOUNTER — Ambulatory Visit (INDEPENDENT_AMBULATORY_CARE_PROVIDER_SITE_OTHER): Payer: Medicare Other | Admitting: Cardiology

## 2015-10-12 VITALS — BP 108/66 | HR 68 | Ht 72.0 in | Wt 185.0 lb

## 2015-10-12 DIAGNOSIS — I951 Orthostatic hypotension: Secondary | ICD-10-CM | POA: Diagnosis not present

## 2015-10-12 DIAGNOSIS — R7303 Prediabetes: Secondary | ICD-10-CM

## 2015-10-12 DIAGNOSIS — Z951 Presence of aortocoronary bypass graft: Secondary | ICD-10-CM

## 2015-10-12 NOTE — Patient Instructions (Signed)
Kerin Ransom, [place], recommends that you continue on your current medications as directed. Please refer to the Current Medication list given to you today.  Please keep your previously scheduled appointment with Dr Stanford Breed on 12/28/15 at 12:15p.  If you need a refill on your cardiac medications before your next appointment, please call your pharmacy.

## 2015-10-12 NOTE — Progress Notes (Signed)
10/12/2015 [name]   [birth year]  [id number]  Primary Physician [name],[name], MD Primary Cardiologist: Dr Stanford Breed  HPI:  82 y/o male with known CAD s/p PCI to LAD 2009, and RCA in 2011, h/o HTN, HLD and borderline DM, presented to the ED 07/01/15 with chest pain and elevated Troponin. Cath revealed 3 V CAD, echo showed normal LVF. He underwent CABG x 4 07/07/15. Post op course was unremarkable. After DC he noted orthostatic dizziness. He admits that this problem has been present since Sept 2016. He has never fallen or been injured secondary to this. He was tried on Midodrine with no effect. Florinef cause fluid retention and unacceptable hypertension, even at 0.1 mg daily. He wears compression stockings and we have liberalized his diet and encouraged fluids. We have considered stopping his Tenormin but he says this is the only medication that helps his palpitations.    Current Outpatient Prescriptions  Medication Sig Dispense Refill  . Acetaminophen (TYLENOL PO) Take 2 tablets by mouth as needed.    Marland Kitchen aspirin EC 81 MG EC tablet Take 1 tablet (81 mg total) by mouth daily.    Marland Kitchen atenolol (TENORMIN) 25 MG tablet Take 1 tablet (25 mg total) by mouth daily. (Patient taking differently: Take 12.5 mg by mouth daily. ) 30 tablet 11  . Bisacodyl (DULCOLAX PO) Take 1 capsule by mouth as needed.    Marland Kitchen CALCIUM PO Take 1 tablet by mouth daily.    . Flaxseed, Linseed, 1000 MG CAPS Take 1,000 mg by mouth daily.     Marland Kitchen gabapentin (NEURONTIN) 300 MG capsule Take 300 mg by mouth daily.    . Magnesium 250 MG TABS Take 250 mg by mouth daily.    . multivitamin (THERAGRAN) per tablet Take 1 tablet by mouth daily.      Marland Kitchen omeprazole (PRILOSEC) 20 MG capsule Take 20 mg by mouth daily as needed.    . ONE TOUCH LANCETS MISC check blood sugar 1 time daily-[id number]. 200 each 0  . ONE TOUCH ULTRA TEST test strip CHECK BLOOD GLUCOSE 1 TIME DAILY 100 each 12   No current facility-administered medications  for this visit.    Allergies  Allergen Reactions  . Cymbalta [Duloxetine Hcl]     Dizziness  . Keflex [Cephalexin] Other (See Comments)    Reaction: unknown   . Simvastatin Other (See Comments)    Reaction: unknown   . Sudafed [Pseudoephedrine]     Dizziness    Social History   Social History  . Marital Status: Married    Spouse Name: N/A  . Number of Children: 4   . Years of Education: N/A   Occupational History  . Retired    Social History Main Topics  . Smoking status: Former Smoker    Quit date: 07/16/1963  . Smokeless tobacco: Never Used  . Alcohol Use: No  . Drug Use: No  . Sexual Activity: Not on file   Other Topics Concern  . Not on file   Social History Narrative   1 caffeine drink daily      Review of Systems: General: negative for chills, fever, night sweats or weight changes.  Cardiovascular: negative for chest pain, dyspnea on exertion, edema, orthopnea, palpitations, paroxysmal nocturnal dyspnea or shortness of breath Dermatological: negative for rash Respiratory: negative for cough or wheezing Urologic: negative for hematuria Abdominal: negative for nausea, vomiting, diarrhea, bright red blood per rectum, melena, or hematemesis Neurologic: negative for visual changes, syncope, or dizziness  All other systems reviewed and are otherwise negative except as noted above.    Blood pressure 108/66, pulse 68, height 6' (1.829 m), weight 185 lb (83.915 kg).  115/70- flat, 92/55-sitting, 79/47- standing General appearance: alert, cooperative and no distress Neck: no JVD Lungs: clear to auscultation bilaterally Heart: regular rate and rhythm Extremities: no edema Skin: warm and dry Neurologic: Grossly normal   ASSESSMENT AND PLAN:   Orthostatic hypotension Midodrine ineffective, Florinef caused unacceptable hypertension and fluid retention  S/P CABG x 4 LIMA-LAD, SVG-Dx, SVG-OM, SVG-RCA 07/07/15 No angina  Prediabetes Followed by  PCP    PLAN  Mr Krippner has "learned to live with" his orthostatic hypotension. I explained that he has failed the usual treatments for this and there may not anything more to do at this point. He asked about liberalizing his activity and I told him to go ahead as tolerated but avoid being out in the heat. He has a follow up in Aug with Dr Stanford Breed.   Kerin Ransom PA-C 10/12/2015 11:01 AM

## 2015-10-12 NOTE — Assessment & Plan Note (Signed)
LIMA-LAD, SVG-Dx, SVG-OM, SVG-RCA 07/07/15 No angina

## 2015-10-12 NOTE — Assessment & Plan Note (Signed)
Followed by PCP

## 2015-10-12 NOTE — Assessment & Plan Note (Signed)
Midodrine ineffective, Florinef caused unacceptable hypertension and fluid retention

## 2015-10-14 DIAGNOSIS — M5134 Other intervertebral disc degeneration, thoracic region: Secondary | ICD-10-CM | POA: Diagnosis not present

## 2015-10-14 DIAGNOSIS — M9902 Segmental and somatic dysfunction of thoracic region: Secondary | ICD-10-CM | POA: Diagnosis not present

## 2015-10-19 ENCOUNTER — Other Ambulatory Visit: Payer: Self-pay | Admitting: Internal Medicine

## 2015-10-19 DIAGNOSIS — H8110 Benign paroxysmal vertigo, unspecified ear: Secondary | ICD-10-CM

## 2015-10-21 DIAGNOSIS — R42 Dizziness and giddiness: Secondary | ICD-10-CM | POA: Diagnosis not present

## 2015-10-26 DIAGNOSIS — M5134 Other intervertebral disc degeneration, thoracic region: Secondary | ICD-10-CM | POA: Diagnosis not present

## 2015-10-26 DIAGNOSIS — M9902 Segmental and somatic dysfunction of thoracic region: Secondary | ICD-10-CM | POA: Diagnosis not present

## 2015-10-29 ENCOUNTER — Ambulatory Visit (INDEPENDENT_AMBULATORY_CARE_PROVIDER_SITE_OTHER): Payer: Medicare Other | Admitting: Internal Medicine

## 2015-10-29 ENCOUNTER — Encounter: Payer: Self-pay | Admitting: Internal Medicine

## 2015-10-29 VITALS — BP 146/84 | HR 64 | Temp 97.6°F | Resp 16 | Ht 72.0 in | Wt 188.6 lb

## 2015-10-29 DIAGNOSIS — R7303 Prediabetes: Secondary | ICD-10-CM | POA: Diagnosis not present

## 2015-10-29 DIAGNOSIS — E782 Mixed hyperlipidemia: Secondary | ICD-10-CM | POA: Diagnosis not present

## 2015-10-29 DIAGNOSIS — E559 Vitamin D deficiency, unspecified: Secondary | ICD-10-CM | POA: Diagnosis not present

## 2015-10-29 DIAGNOSIS — Z79899 Other long term (current) drug therapy: Secondary | ICD-10-CM | POA: Diagnosis not present

## 2015-10-29 DIAGNOSIS — Z951 Presence of aortocoronary bypass graft: Secondary | ICD-10-CM | POA: Diagnosis not present

## 2015-10-29 DIAGNOSIS — R6889 Other general symptoms and signs: Secondary | ICD-10-CM

## 2015-10-29 DIAGNOSIS — Z125 Encounter for screening for malignant neoplasm of prostate: Secondary | ICD-10-CM | POA: Diagnosis not present

## 2015-10-29 DIAGNOSIS — D519 Vitamin B12 deficiency anemia, unspecified: Secondary | ICD-10-CM

## 2015-10-29 DIAGNOSIS — K219 Gastro-esophageal reflux disease without esophagitis: Secondary | ICD-10-CM | POA: Diagnosis not present

## 2015-10-29 DIAGNOSIS — Z0001 Encounter for general adult medical examination with abnormal findings: Secondary | ICD-10-CM | POA: Diagnosis not present

## 2015-10-29 DIAGNOSIS — Z1212 Encounter for screening for malignant neoplasm of rectum: Secondary | ICD-10-CM

## 2015-10-29 DIAGNOSIS — Z136 Encounter for screening for cardiovascular disorders: Secondary | ICD-10-CM

## 2015-10-29 DIAGNOSIS — I714 Abdominal aortic aneurysm, without rupture, unspecified: Secondary | ICD-10-CM

## 2015-10-29 DIAGNOSIS — I10 Essential (primary) hypertension: Secondary | ICD-10-CM | POA: Diagnosis not present

## 2015-10-29 LAB — CBC WITH DIFFERENTIAL/PLATELET
Basophils Absolute: 0 cells/uL (ref 0–200)
Basophils Relative: 0 %
Eosinophils Absolute: 304 cells/uL (ref 15–500)
Eosinophils Relative: 4 %
HCT: 37.4 % — ABNORMAL LOW (ref 38.5–50.0)
Hemoglobin: 11.7 g/dL — ABNORMAL LOW (ref 13.2–17.1)
Lymphocytes Relative: 29 %
Lymphs Abs: 2204 cells/uL (ref 850–3900)
MCH: 27.5 pg (ref 27.0–33.0)
MCHC: 31.3 g/dL — ABNORMAL LOW (ref 32.0–36.0)
MCV: 87.8 fL (ref 80.0–100.0)
MPV: 9 fL (ref 7.5–12.5)
Monocytes Absolute: 456 cells/uL (ref 200–950)
Monocytes Relative: 6 %
Neutro Abs: 4636 cells/uL (ref 1500–7800)
Neutrophils Relative %: 61 %
Platelets: 240 10*3/uL (ref 140–400)
RBC: 4.26 MIL/uL (ref 4.20–5.80)
RDW: 15.5 % — ABNORMAL HIGH (ref 11.0–15.0)
WBC: 7.6 10*3/uL (ref 3.8–10.8)

## 2015-10-29 LAB — BASIC METABOLIC PANEL WITH GFR
BUN: 17 mg/dL (ref 7–25)
CO2: 29 mmol/L (ref 20–31)
Calcium: 9 mg/dL (ref 8.6–10.3)
Chloride: 102 mmol/L (ref 98–110)
Creat: 1.32 mg/dL — ABNORMAL HIGH (ref 0.70–1.11)
GFR, Est African American: 58 mL/min — ABNORMAL LOW (ref >=60)
GFR, Est Non African American: 51 mL/min — ABNORMAL LOW (ref >=60)
Glucose, Bld: 95 mg/dL (ref 65–99)
Potassium: 4.4 mmol/L (ref 3.5–5.3)
Sodium: 139 mmol/L (ref 135–146)

## 2015-10-29 LAB — HEPATIC FUNCTION PANEL
ALT: 8 U/L — ABNORMAL LOW (ref 9–46)
AST: 21 U/L (ref 10–35)
Albumin: 3.4 g/dL — ABNORMAL LOW (ref 3.6–5.1)
Alkaline Phosphatase: 103 U/L (ref 40–115)
Bilirubin, Direct: 0.1 mg/dL (ref <=0.2)
Indirect Bilirubin: 0.3 mg/dL (ref 0.2–1.2)
Total Bilirubin: 0.4 mg/dL (ref 0.2–1.2)
Total Protein: 6.2 g/dL (ref 6.1–8.1)

## 2015-10-29 LAB — LIPID PANEL
Cholesterol: 171 mg/dL (ref 125–200)
HDL: 31 mg/dL — ABNORMAL LOW (ref >=40)
LDL Cholesterol: 116 mg/dL (ref <130)
Total CHOL/HDL Ratio: 5.5 Ratio — ABNORMAL HIGH (ref <=5.0)
Triglycerides: 118 mg/dL (ref <150)
VLDL: 24 mg/dL (ref <30)

## 2015-10-29 LAB — MAGNESIUM: Magnesium: 2.1 mg/dL (ref 1.5–2.5)

## 2015-10-29 LAB — TSH: TSH: 2.2 mIU/L (ref 0.40–4.50)

## 2015-10-29 LAB — VITAMIN B12: Vitamin B-12: 1192 pg/mL — ABNORMAL HIGH (ref 200–1100)

## 2015-10-29 NOTE — Progress Notes (Signed)
Patient ID: [name], male   DOB: 1934/03/18, 82 y.o.   MRN: [id number]  [facility] ADULT & ADOLESCENT INTERNAL MEDICINE   [name], M.D.    Uvaldo Bristle. Silverio Lay, P.A.-C      Starlyn Skeans, P.A.-C   [facility]                [address] [place], [place] [id number] Telephone [phone] Telefax [phone] _________________________________  Annual  Screening/Preventative Visit And Comprehensive Evaluation & Examination     This very nice 82 y.o. MWM presents for a Wellness/Preventative Visit & comprehensive evaluation and management of multiple medical co-morbidities.  Patient has been followed for HTN, ASCAD/CABG, Prediabetes, Hyperlipidemia and Vitamin D Deficiency.     HTN predates since 1995 . Patient's BP has been controlled at home.  In 2005 & 2011, patient had PTCA x 2 and in Mar 2017 , he presented with an acute NSTEMI and subsequently underwent CABG and then developed problems with postural orthostasis. He was initially treated with Midodrine and subsequently with Florinef which was later d'c for dependent edema. Now patient seems stabilized on a very small dose of Atenolol. Today's BP is 146/84-sitting and 112/80-standing. Patient denies any cardiac symptoms as chest pain, palpitations, shortness of breath, postural dizziness or ankle swelling. He does report a sensation of "wooziness" or dysequilibrium with positional head changes. He did see Dr Radene Journey.      Patient's hyperlipidemia is controlled with diet and medications. Patient denies myalgias or other medication SE's. Last lipids were Cholesterol 156; HDL 25*; LDL 103*; Triglycerides 140 on  07/02/2015.     Patient has prediabetes since 2008 and he's attempted to manage  and patient denies reactive hypoglycemic symptoms, visual blurring, diabetic polys or paresthesias. Last A1c was 6.2% on 07/05/2015.      Finally, patient has history of Vitamin D Deficiency of  "48" on treatment in 2008  and last vitamin D was 75 on 08/11/2015.   Medication Sig  . Acetaminophen  Take 2 tablets by mouth as needed.  Marland Kitchen aspirin EC 81 MG EC Take 1 tablet (81 mg total) by mouth daily.  Marland Kitchen atenolol  25 MG  Take 1 tablet (25 mg total) by mouth daily. (Patient taking differently: Take 12.5 mg by mouth daily. )  . DULCOLAX  Take 1 capsule by mouth as needed.  Marland Kitchen CALCIUM  Take 1 tablet by mouth daily.  . Flaxseed, Linseed, 1000 MG  Take 1,000 mg by mouth daily.   Marland Kitchen gabapentin  300 MG  Take 300 mg by mouth daily.  . Magnesium 250 MG  Take 250 mg by mouth daily.  . multivitamin  Take 1 tablet by mouth daily.    Marland Kitchen omeprazole  20 MG  Take 20 mg by mouth daily as needed.   Allergies  Allergen Reactions  . Cymbalta [Duloxetine Hcl]     Dizziness  . Keflex [Cephalexin] Other (See Comments)    Reaction: unknown   . Simvastatin Other (See Comments)    Reaction: unknown   . Sudafed [Pseudoephedrine]     Dizziness   Past Medical History  Diagnosis Date  . Coronary atherosclerosis of unspecified type of vessel, native or graft   . 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 Personal history of other diseases of digestive system   . Other and unspecified hyperlipidemia   . Colon polyps   .  Gastritis, chronic   . IBS (irritable bowel syndrome)   . Hypertension   . Diabetes mellitus     Diet control   . Vitamin D deficiency   . Vitamin B 12 deficiency   . Esophageal reflux     Immunization History  Administered Date(s) Administered  . Influenza, High Dose Seasonal PF 12/30/2013, 02/02/2015  . Influenza-Unspecified 01/13/2013  . Pneumococcal Conjugate-13 02/14/2014  . Pneumococcal-Unspecified 06/15/2004  . Tetanus 10/01/2012  . Zoster 10/01/2012   Past Surgical History  Procedure Laterality Date  . Knee surgery    . Bladder surgery    . Coronary angioplasty with stent placement    . Cardiac catheterization N/A 07/01/2015    Procedure: Left Heart Cath and Coronary  Angiography;  Surgeon: Wellington Hampshire, MD  . Coronary artery bypass graft N/A 07/06/2015    Procedure: CORONARY ARTERY BYPASS GRAFTING (CABG)x 4   utilizing the left internal mammary artery and endoscopically harvested bilateral  sapheneous vein.;  Surgeon: Ivin Poot, MD  . [place] Dates without cardioversion N/A 07/06/2015    Procedure: TRANSESOPHAGEAL ECHOCARDIOGRAM (TEE);  Surgeon: Ivin Poot, MD   Family History  Problem Relation Age of Onset  . Heart attack Father     died age 37  . Heart attack Brother     died age 40  . Heart attack Sister     died age 80  . Colon cancer Sister   . Liver cancer Sister   . Diabetes Maternal Grandmother   . Colon polyps Sister     and brothers x 2     Social History   Social History  . Marital Status: Married    Spouse Name: N/A  . Number of Children: 4   . Years of Education: N/A   Occupational History  . Retired    Social History Main Topics  . Smoking status: Former Smoker    Quit date: 07/16/1963  . Smokeless tobacco: Never Used  . Alcohol Use: No  . Drug Use: No  . Sexual Activity: Not on file   Other Topics Concern  . Not on file   Social History Narrative   1 caffeine drink daily     ROS Constitutional: Denies fever, chills, weight loss/gain, headaches, insomnia,  night sweats or change in appetite. Does c/o fatigue. Eyes: Denies redness, blurred vision, diplopia, discharge, itchy or watery eyes.  ENT: Denies discharge, congestion, post nasal drip, epistaxis, sore throat, earache, hearing loss, dental pain, Tinnitus, Vertigo, Sinus pain or snoring.  Cardio: Denies chest pain, palpitations, irregular heartbeat, syncope, dyspnea, diaphoresis, orthopnea, PND, claudication or edema Respiratory: denies cough, dyspnea, DOE, pleurisy, hoarseness, laryngitis or wheezing.  Gastrointestinal: Denies dysphagia, heartburn, reflux, water brash, pain, cramps, nausea, vomiting, bloating, diarrhea, constipation, hematemesis, melena,  hematochezia, jaundice or hemorrhoids Genitourinary: Denies dysuria, frequency, urgency, nocturia, hesitancy, discharge, hematuria or flank pain Musculoskeletal: Denies arthralgia, myalgia, stiffness, Jt. Swelling, pain, limp or strain/sprain. Denies Falls. Skin: Denies puritis, rash, hives, warts, acne, eczema or change in skin lesion Neuro: No weakness, tremor, incoordination, spasms, paresthesia or pain Psychiatric: Denies confusion, memory loss or sensory loss. Denies Depression. Endocrine: Denies change in weight, skin, hair change, nocturia, and paresthesia, diabetic polys, visual blurring or hyper / hypo glycemic episodes.  Heme/Lymph: No excessive bleeding, bruising or enlarged lymph nodes.  Physical Exam  BP 146/84 mmHg  Pulse 64  Temp(Src) 97.6 F (36.4 C)  Resp 16  Ht 6' (1.829 m)  Wt 188 lb 9.6 oz (85.548 kg)  BMI 25.57 kg/m2  General Appearance: Well nourished, in no apparent distress.  Eyes: PERRLA, EOMs, conjunctiva no swelling or erythema, normal fundi and vessels. Sinuses: No frontal/maxillary tenderness ENT/Mouth: EACs patent / TMs  nl. Nares clear without erythema, swelling, mucoid exudates. Oral hygiene is good. No erythema, swelling, or exudate. Tongue normal, non-obstructing. Tonsils not swollen or erythematous. Hearing normal.  Neck: Supple, thyroid normal. No bruits, nodes or JVD. Respiratory: Respiratory effort normal.  BS equal and clear bilateral without rales, rhonci, wheezing or stridor. Cardio: Heart sounds are normal with regular rate and rhythm and no murmurs, rubs or gallops. Peripheral pulses are normal and equal bilaterally without edema. No aortic or femoral bruits. Chest: Median Sternotomy scar. Normal excursions and percussion.  Abdomen: Soft, with Nl bowel sounds. Nontender, no guarding, rebound, hernias, masses, or organomegaly.  Lymphatics: Non tender without lymphadenopathy.  Genitourinary: No hernias.Testes nl. DRE - deferred for  age. Musculoskeletal: Full ROM all peripheral extremities, joint stability, 5/5 strength, and normal gait. Skin: Warm and dry without rashes, lesions, cyanosis, clubbing or  ecchymosis.  Neuro: Cranial nerves intact, reflexes equal bilaterally. Normal muscle tone, no cerebellar symptoms. Sensation intact.  Pysch: Alert and oriented X 3 with normal affect, insight and judgment appropriate.   Assessment and Plan  1. Annual Preventative/Screening Exam   - Microalbumin / creatinine urine ratio - EKG 12-Lead - POC Hemoccult Bld/St; Future - Urinalysis, Routine w reflex microscopic  - Vitamin B12 - PSA - CBC with Differential/Platelet - BASIC METABOLIC PANEL WITH GFR - Hepatic function panel - Magnesium - Lipid panel - TSH - Hemoglobin A1c - Insulin, random - VITAMIN D 25 Hydroxy  2. Essential hypertension  - Microalbumin / creatinine urine ratio - EKG 12-Lead - TSH  3. Hyperlipidemia  - Lipid panel - TSH  4. Prediabetes  - Hemoglobin A1c - Insulin, random  5. Vitamin D deficiency  - VITAMIN D 25 Hydroxy   6. S/P CABG x 4   7. Gastroesophageal reflux disease, esophagitis presence not specified   8. B12 deficiency anemia  - Vitamin B12  9. Abdominal aortic aneurysm (AAA) without rupture ([place])   10. Screening for rectal cancer  - POC Hemoccult Bld/Stl   11. Prostate cancer screening  - PSA  12. Medication management  - Urinalysis, Routine w reflex microscopic   13. Screening for ischemic heart disease  - CBC with Differential/Platelet - BASIC METABOLIC PANEL WITH GFR - Hepatic function panel - Magnesium   Continue prudent diet as discussed, weight control, BP monitoring, regular exercise, and medications as discussed.  Discussed med effects and SE's. Routine screening labs and tests as requested with regular follow-up as recommended. Over 40 minutes of exam, counseling, chart review and high complex critical decision making was performed

## 2015-10-29 NOTE — Patient Instructions (Signed)

## 2015-10-30 ENCOUNTER — Other Ambulatory Visit: Payer: Self-pay | Admitting: Internal Medicine

## 2015-10-30 DIAGNOSIS — E785 Hyperlipidemia, unspecified: Secondary | ICD-10-CM

## 2015-10-30 LAB — URINALYSIS, ROUTINE W REFLEX MICROSCOPIC
Bilirubin Urine: NEGATIVE
Glucose, UA: NEGATIVE
Hgb urine dipstick: NEGATIVE
Ketones, ur: NEGATIVE
Leukocytes, UA: NEGATIVE
Nitrite: NEGATIVE
Protein, ur: NEGATIVE
Specific Gravity, Urine: 1.012 (ref 1.001–1.035)
pH: 7.5 (ref 5.0–8.0)

## 2015-10-30 LAB — MICROALBUMIN / CREATININE URINE RATIO
Creatinine, Urine: 71 mg/dL (ref 20–370)
Microalb Creat Ratio: 4 mcg/mg creat (ref <30)
Microalb, Ur: 0.3 mg/dL

## 2015-10-30 LAB — HEMOGLOBIN A1C
Hgb A1c MFr Bld: 6.8 % — ABNORMAL HIGH (ref <5.7)
Mean Plasma Glucose: 148 mg/dL

## 2015-10-30 LAB — VITAMIN D 25 HYDROXY (VIT D DEFICIENCY, FRACTURES): Vit D, 25-Hydroxy: 87 ng/mL (ref 30–100)

## 2015-10-30 LAB — PSA: PSA: 0.83 ng/mL (ref <=4.00)

## 2015-10-30 LAB — INSULIN, RANDOM: Insulin: 4.8 u[IU]/mL (ref 2.0–19.6)

## 2015-10-30 MED ORDER — ATORVASTATIN CALCIUM 80 MG PO TABS: ORAL_TABLET | ORAL | Status: DC

## 2015-11-04 DIAGNOSIS — M9902 Segmental and somatic dysfunction of thoracic region: Secondary | ICD-10-CM | POA: Diagnosis not present

## 2015-11-04 DIAGNOSIS — M5134 Other intervertebral disc degeneration, thoracic region: Secondary | ICD-10-CM | POA: Diagnosis not present

## 2015-11-11 DIAGNOSIS — M5134 Other intervertebral disc degeneration, thoracic region: Secondary | ICD-10-CM | POA: Diagnosis not present

## 2015-11-11 DIAGNOSIS — M9902 Segmental and somatic dysfunction of thoracic region: Secondary | ICD-10-CM | POA: Diagnosis not present

## 2015-11-16 ENCOUNTER — Other Ambulatory Visit: Payer: Self-pay

## 2015-11-16 DIAGNOSIS — Z0001 Encounter for general adult medical examination with abnormal findings: Secondary | ICD-10-CM

## 2015-11-16 DIAGNOSIS — Z1212 Encounter for screening for malignant neoplasm of rectum: Secondary | ICD-10-CM

## 2015-11-16 LAB — POC HEMOCCULT BLD/STL (HOME/3-CARD/SCREEN)
Card #2 Fecal Occult Blod, POC: NEGATIVE
Card #3 Fecal Occult Blood, POC: NEGATIVE
Fecal Occult Blood, POC: NEGATIVE

## 2015-11-18 DIAGNOSIS — M9902 Segmental and somatic dysfunction of thoracic region: Secondary | ICD-10-CM | POA: Diagnosis not present

## 2015-11-18 DIAGNOSIS — M5134 Other intervertebral disc degeneration, thoracic region: Secondary | ICD-10-CM | POA: Diagnosis not present

## 2015-12-02 DIAGNOSIS — M9902 Segmental and somatic dysfunction of thoracic region: Secondary | ICD-10-CM | POA: Diagnosis not present

## 2015-12-02 DIAGNOSIS — M5134 Other intervertebral disc degeneration, thoracic region: Secondary | ICD-10-CM | POA: Diagnosis not present

## 2015-12-08 ENCOUNTER — Other Ambulatory Visit: Payer: Self-pay | Admitting: Internal Medicine

## 2015-12-10 ENCOUNTER — Ambulatory Visit (INDEPENDENT_AMBULATORY_CARE_PROVIDER_SITE_OTHER): Payer: Medicare Other | Admitting: Internal Medicine

## 2015-12-10 VITALS — BP 134/82 | HR 64 | Temp 97.3°F | Resp 16 | Ht 72.0 in | Wt 190.0 lb

## 2015-12-10 DIAGNOSIS — G548 Other nerve root and plexus disorders: Secondary | ICD-10-CM

## 2015-12-10 DIAGNOSIS — G588 Other specified mononeuropathies: Secondary | ICD-10-CM

## 2015-12-10 MED ORDER — PREDNISONE 20 MG PO TABS: ORAL_TABLET | ORAL | 0 refills | Status: DC

## 2015-12-10 NOTE — Patient Instructions (Signed)
intercostal neuralgia.  Intercostal Nerve Block       The twelve intercostal nerves arise from the first thru twelfth thoracic nerve roots.  The nerve begins at the spine and wraps around the body, lying in a groove underneath each rib.  Each intercostal nerve innervates a specific strip of skin and body walk of the abdomen and chest.  Therefore, injuries of the chest wall or abdominal wall result in pain that is transmitted back to the brian via the intercostal nerves.  Examples of such injuries include rib fractures and incisions for lung and gall bladder surgery.  Occasionally, pain may persist long after an injury or surgical incision secondary to inflammation and irritation of the intercostal nerve.  The longstanding pain is known as          An intercostal nerve block is preformed to eliminate pain either temporarily or permanently.  A small needle is placed below the rib and local anesthetic (like Novocaine) and possibly steroid is injected.  Usually 2-4 intercostal nerves are blocked at a time depending on the problem.  The patient will experience a slight "pin-prick" sensation for each injection.  Shortly thereafter, the strip of skin that is innervated by the blocked intercostal nerve will feel numb.  Persistent pain that is only temporarily relieved with local anesthetic may require a more permanent block. This procedure is called Cryoneurolysis and entails placing a small probe beneath the rib to freeze the nerve.

## 2015-12-16 ENCOUNTER — Encounter: Payer: Self-pay | Admitting: Internal Medicine

## 2015-12-16 NOTE — Progress Notes (Signed)
Subjective:    Patient ID: [name], male    DOB: 1933/08/06, 82 y.o.   MRN: [id number]  HPI This very nice 82 yo MWM with HTN, ASCAD/coronary artery bypass grafting, preDm presented with several week prodrome of increasing sharp to burning discomfort of the right posterior lateral to anterior chest in the  Rt T4/T5 dermatome. He has noted some positional component . Denies an exertional component,N/V, dyspnea or GI sx's.   Medication Sig  . Acetaminophen (TYLENOL PO) Take 2 tablets by mouth as needed.  Marland Kitchen aspirin EC 81 MG EC tablet Take 1 tablet (81 mg total) by mouth daily.  Marland Kitchen atenolol (TENORMIN) 25 MG tablet Patient taking 12.5 mg by mouth daily  . atorvastatin (LIPITOR) 80 MG tablet Take 1/2 to 1 tab daily for Cholesterol   . Bisacodyl (DULCOLAX PO) Take 1 capsule by mouth as needed.  Marland Kitchen CALCIUM PO Take 1 tablet by mouth daily.  . Flaxseed  1000 MG CAPS Take 1,000 mg by mouth daily.   Marland Kitchen gabapentin 300 MG capsule Take 300 mg by mouth daily.  . Magnesium 250 MG TABS Take 250 mg by mouth daily.  . multivitamin  Take 1 tablet by mouth daily.    Marland Kitchen omeprazole  20 MG Take  daily as needed.  . traMADol 50 MG tablet TAKE 1 TAB FOUR TIMES DAILY AS NEEDED    Past Medical History:  Diagnosis Date  . 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 Colon polyps   . Coronary atherosclerosis of unspecified type of vessel, native or graft   . Diabetes mellitus    Diet control   . Esophageal reflux   . Gastritis, chronic   . Hypertension   . IBS (irritable bowel syndrome)   . Other and unspecified hyperlipidemia   . Personal history of other diseases of digestive system   . Vitamin B 12 deficiency   . Vitamin D deficiency    Past Surgical History:  Procedure Laterality Date  . BLADDER SURGERY    . CARDIAC CATHETERIZATION N/A 07/01/2015   Procedure: Left Heart Cath and Coronary Angiography;  Surgeon: Wellington Hampshire, MD;  Location: [place] [facility];  Service: Cardiovascular;  Laterality: N/A;  .  CORONARY ANGIOPLASTY WITH STENT PLACEMENT    . CORONARY ARTERY BYPASS GRAFT N/A 07/06/2015   Procedure: CORONARY ARTERY BYPASS GRAFTING (CABG)x 4   utilizing the left internal mammary artery and endoscopically harvested bilateral  sapheneous vein.;  Surgeon: Ivin Poot, MD;  Location: [place];  Service: Open Heart Surgery;  Laterality: N/A;  . KNEE SURGERY    . TEE WITHOUT CARDIOVERSION N/A 07/06/2015   Procedure: TRANSESOPHAGEAL ECHOCARDIOGRAM (TEE);  Surgeon: Ivin Poot, MD;  Location: [place];  Service: Open Heart Surgery;  Laterality: N/A;   Review of Systems 10 point systems review negative except as above.    Objective:   Physical Exam  BP 134/82   Pulse 64   Temp 97.3 F (36.3 C)   Resp 16   Ht 6' (1.829 m)   Wt 190 lb (86.2 kg)   BMI 25.77 kg/m   In no distress  HEENT - Eac's patent. TM's Nl. EOM's full. PERRLA. NasoOroPharynx clear. Neck - supple. Nl Thyroid. Carotids 2+ & No bruits, nodes, JVD Chest - Clear. Sl tender along the Rt T4 dermatome Cor - Nl HS. RRR w/o sig MGR. No edema. Abd - soft , benign, non-tender. MS- FROM w/o deformities. Muscle power, tone and bulk Nl.  Gait Nl. Neuro - No obvious Cr N abnormalities.  Nl w/o focal abnormalities.    Assessment & Plan:   1. Intercostal neuralgia  - recc increase Gabapentin 300 mg from qd to tid and return in 2 weeks to reassess.

## 2015-12-24 ENCOUNTER — Encounter: Payer: Self-pay | Admitting: Internal Medicine

## 2015-12-24 ENCOUNTER — Ambulatory Visit (INDEPENDENT_AMBULATORY_CARE_PROVIDER_SITE_OTHER): Payer: Medicare Other | Admitting: Internal Medicine

## 2015-12-24 VITALS — BP 100/62 | HR 72 | Temp 97.2°F | Resp 16 | Ht 72.0 in | Wt 186.0 lb

## 2015-12-24 DIAGNOSIS — I951 Orthostatic hypotension: Secondary | ICD-10-CM | POA: Diagnosis not present

## 2015-12-24 DIAGNOSIS — G548 Other nerve root and plexus disorders: Secondary | ICD-10-CM

## 2015-12-24 DIAGNOSIS — G588 Other specified mononeuropathies: Secondary | ICD-10-CM | POA: Insufficient documentation

## 2015-12-24 MED ORDER — GABAPENTIN 600 MG PO TABS: ORAL_TABLET | ORAL | 5 refills | Status: DC

## 2015-12-24 NOTE — Progress Notes (Signed)
Subjective:    Patient ID: [name], male    DOB: October 14, 1933, 82 y.o.   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 He reports minimal improvement in his pain. He also has hx/o dysautonomia and orthostatic hypotension and report his BP's have again become low since d/c'ing his Florinef. Denies HA's, fainting, CP, palpitations dyspnea or edema.   Medication Sig  . Acetaminophen (TYLENOL PO) Take 2 tablets by mouth as needed.  Marland Kitchen aspirin EC 81 MG EC tablet Take 1 tablet (81 mg total) by mouth daily.  Marland Kitchen atenolol (TENORMIN) 25 MG tablet Takes 12.5 mg by mouth daily. )  . atorvastatin (LIPITOR) 80 MG tablet Take 1/2 to 1 tablet daily for Cholesterol or as directed  . Bisacodyl (DULCOLAX PO) Take 1 capsule by mouth as needed.  Marland Kitchen CALCIUM PO Take 1 tablet by mouth daily.  . Flaxseed, Linseed, 1000 MG CAPS Take 1,000 mg by mouth daily.   Marland Kitchen gabapentin  300 MG capsule Take 300 mg by mouth daily.  . Magnesium 250 MG TABS Take 250 mg by mouth daily.  . multivitamin (THERAGRAN) per tablet Take 1 tablet by mouth daily.    Marland Kitchen omeprazole  20 MG capsule Take 20 mg by mouth daily as needed.  . traMADol (ULTRAM) 50 MG tablet TAKE 1 TAB 4 x  DAILY AS NEEDED FOR SEVERE PAIN   Allergies  Allergen Reactions  . Cymbalta [Duloxetine Hcl]     Dizziness  . Keflex [Cephalexin] Other (See Comments)    Reaction: unknown   . Simvastatin Other (See Comments)    Reaction: unknown   . Sudafed [Pseudoephedrine]     Dizziness   Past Medical History:  Diagnosis Date  . 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 Colon polyps   . Coronary atherosclerosis of unspecified type of vessel, native or graft   . Diabetes mellitus    Diet control   . Esophageal reflux   . Gastritis, chronic   . Hypertension   . IBS (irritable bowel syndrome)   . Other and  unspecified hyperlipidemia   . Personal history of other diseases of digestive system   . Vitamin B 12 deficiency   . Vitamin D deficiency    Past Surgical History:  Procedure Laterality Date  . BLADDER SURGERY    . CARDIAC CATHETERIZATION N/A 07/01/2015   Procedure: Left Heart Cath and Coronary Angiography;  Surgeon: Wellington Hampshire, MD;  Location: [place] [facility];  Service: Cardiovascular;  Laterality: N/A;  . CORONARY ANGIOPLASTY WITH STENT PLACEMENT    . CORONARY ARTERY BYPASS GRAFT N/A 07/06/2015   Procedure: CORONARY ARTERY BYPASS GRAFTING (CABG)x 4   utilizing the left internal mammary artery and endoscopically harvested bilateral  sapheneous vein.;  Surgeon: Ivin Poot, MD;  Location: [place];  Service: Open Heart Surgery;  Laterality: N/A;  . KNEE SURGERY    . TEE WITHOUT CARDIOVERSION N/A 07/06/2015   Procedure: TRANSESOPHAGEAL ECHOCARDIOGRAM (TEE);  Surgeon: Ivin Poot, MD;  Location: [place];  Service: Open Heart Surgery;  Laterality: N/A;   Review of Systems  10 point systems review negative except as above.    Objective:   Physical Exam  BP 100/62 to standing 80/58   P 72   T 97.2 F  R 16   Ht  6'    Wt 186 lb    BMI 25.23   reCk sitting BP 102/60, P 68 and standing BP 86/56, P 64  HEENT - Eac's patent. TM's Nl. EOM's full. PERRLA. NasoOroPharynx clear. Neck - supple. Nl Thyroid. Carotids 2+ & No bruits, nodes, JVD Chest - Clear equal BS w/o Rales, rhonchi, wheezes. (+) sl tender along the course of the Rt T 4/5 dermatome  Cor - Nl HS. RRR w/o sig MGR. PP 1(+). No edema. Abd - No palpable organomegaly, masses or tenderness. BS nl. MS- FROM w/o deformities. Muscle power, tone and bulk Nl. Gait Nl. Neuro - No obvious Cr N abnormalities. Sensory, motor and Cerebellar functions appear Nl w/o focal abnormalities.    Assessment & Plan:   1. Intercostal neuralgia  - try increase Gabapentin to 600 mg 3 x/ day  - Ambulatory referral to Anesthesiology  2.  Orthostatic hypotension  - restart Florinef closely monitoring BP's to titrate dose.  - ROV 1 month

## 2015-12-25 NOTE — Progress Notes (Signed)
HPI: FU CAD h/o HTN, HLD and borderline DM. Abd ultrasound 1/17 showed infrarenal fusiform AAA measuring 3.2 cm x 2.8 cm; bilateral common iliac artery aneurysms measuring 1.6 cm x 2.2 cm on the right and 1.4 cm x 2.2 cm on the left. FU one year. Cath 3/17 revealed 3 V CAD, echo showed normal LVF. Preoperative evaluation included carotids and ABIs that were normal. He underwent CABG x 4 on 07/07/15. Post op course was unremarkable. He has had difficulties with orthostasis and hypotension since CABG. Since last seen, the patient denies any dyspnea on exertion, orthopnea, PND, pedal edema, palpitations, syncope or chest pain. Dizziness with standing persist but improved.   Current Outpatient Prescriptions  Medication Sig Dispense Refill  . Acetaminophen (TYLENOL PO) Take 2 tablets by mouth as needed.    Marland Kitchen aspirin EC 81 MG EC tablet Take 1 tablet (81 mg total) by mouth daily.    Marland Kitchen atorvastatin (LIPITOR) 80 MG tablet Take 1/2 to 1 tablet daily for Cholesterol or as directed 90 tablet 1  . Bisacodyl (DULCOLAX PO) Take 1 capsule by mouth as needed.    Marland Kitchen CALCIUM PO Take 1 tablet by mouth daily.    . Flaxseed, Linseed, 1000 MG CAPS Take 1,000 mg by mouth daily.     Marland Kitchen gabapentin (NEURONTIN) 600 MG tablet TK 1/2 TO 1 T PO TID PRF PAIN  5  . Magnesium 250 MG TABS Take 250 mg by mouth daily.    . multivitamin (THERAGRAN) per tablet Take 1 tablet by mouth daily.      Marland Kitchen omeprazole (PRILOSEC) 20 MG capsule Take 20 mg by mouth daily as needed.    . ONE TOUCH LANCETS MISC check blood sugar 1 time daily-DX-R73.03. 200 each 0  . ONE TOUCH ULTRA TEST test strip CHECK BLOOD GLUCOSE 1 TIME DAILY 100 each 12   No current facility-administered medications for this visit.      Past Medical History:  Diagnosis Date  . 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 Colon polyps   . Coronary atherosclerosis of unspecified type of vessel, native or graft   . Diabetes mellitus    Diet control   . Esophageal reflux   .  Gastritis, chronic   . Hypertension   . IBS (irritable bowel syndrome)   . Other and unspecified hyperlipidemia   . Personal history of other diseases of digestive system   . Vitamin B 12 deficiency   . Vitamin D deficiency     Past Surgical History:  Procedure Laterality Date  . BLADDER SURGERY    . CARDIAC CATHETERIZATION N/A 07/01/2015   Procedure: Left Heart Cath and Coronary Angiography;  Surgeon: Wellington Hampshire, MD;  Location: [place] [facility];  Service: Cardiovascular;  Laterality: N/A;  . CORONARY ANGIOPLASTY WITH STENT PLACEMENT    . CORONARY ARTERY BYPASS GRAFT N/A 07/06/2015   Procedure: CORONARY ARTERY BYPASS GRAFTING (CABG)x 4   utilizing the left internal mammary artery and endoscopically harvested bilateral  sapheneous vein.;  Surgeon: Ivin Poot, MD;  Location: [place];  Service: Open Heart Surgery;  Laterality: N/A;  . KNEE SURGERY    . TEE WITHOUT CARDIOVERSION N/A 07/06/2015   Procedure: TRANSESOPHAGEAL ECHOCARDIOGRAM (TEE);  Surgeon: Ivin Poot, MD;  Location: [place];  Service: Open Heart Surgery;  Laterality: N/A;    Social History   Social History  . Marital status: Married    Spouse name: N/A  . Number of children:  4   . Years of education: N/A   Occupational History  . Retired    Social History Main Topics  . Smoking status: Former Smoker    Quit date: 07/16/1963  . Smokeless tobacco: Never Used  . Alcohol use No  . Drug use: No  . Sexual activity: Not on file   Other Topics Concern  . Not on file   Social History Narrative   1 caffeine drink daily     Family History  Problem Relation Age of Onset  . Heart attack Father     died age 85  . Heart attack Brother     died age 62  . Heart attack Sister     died age 43  . Colon cancer Sister   . Liver cancer Sister   . Diabetes Maternal Grandmother   . Colon polyps Sister     and brothers x 2     ROS: Some pain and right rib area but no fevers or chills, productive cough,  hemoptysis, dysphasia, odynophagia, melena, hematochezia, dysuria, hematuria, rash, seizure activity, orthopnea, PND, pedal edema, claudication. Remaining systems are negative.  Physical Exam: Well-developed well-nourished in no acute distress.  Skin is warm and dry.  HEENT is normal.  Neck is supple.  Chest is clear to auscultation with normal expansion.  Cardiovascular exam is regular rate and rhythm.  Abdominal exam nontender or distended. No masses palpated. Extremities show no edema. neuro grossly intact    A/P  1 Coronary artery disease-continue aspirin and statin.  2 orthostasis-symptoms have improved. I stressed the importance of continued fluid intake.  3 hyperlipidemia-continue low-dose pravastatin. Intolerant to Lipitor or Crestor.  4 abdominal aortic aneurysm-follow-up ultrasound January 2018.  Kirk Ruths, MD

## 2015-12-28 ENCOUNTER — Ambulatory Visit (INDEPENDENT_AMBULATORY_CARE_PROVIDER_SITE_OTHER): Payer: Medicare Other | Admitting: Cardiology

## 2015-12-28 ENCOUNTER — Encounter: Payer: Self-pay | Admitting: Cardiology

## 2015-12-28 VITALS — BP 140/84 | HR 76 | Ht 72.0 in | Wt 191.0 lb

## 2015-12-28 DIAGNOSIS — I714 Abdominal aortic aneurysm, without rupture, unspecified: Secondary | ICD-10-CM

## 2015-12-28 DIAGNOSIS — E785 Hyperlipidemia, unspecified: Secondary | ICD-10-CM

## 2015-12-28 DIAGNOSIS — I951 Orthostatic hypotension: Secondary | ICD-10-CM

## 2015-12-28 DIAGNOSIS — I2510 Atherosclerotic heart disease of native coronary artery without angina pectoris: Secondary | ICD-10-CM | POA: Diagnosis not present

## 2015-12-28 NOTE — Patient Instructions (Signed)
Medication Instructions:   NO CHANGE  Follow-Up: Your physician wants you to follow-up in: 6 MONTHS WITH DR CRENSHAW. You will receive a reminder letter in the mail two months in advance. If you don't receive a letter, please call our office to schedule the follow-up appointment.  If you need a refill on your cardiac medications before your next appointment, please call your pharmacy.   

## 2015-12-30 ENCOUNTER — Telehealth: Payer: Self-pay | Admitting: *Deleted

## 2015-12-30 NOTE — Telephone Encounter (Signed)
Spouse called and reported the patient restarted his Florinef and his BP has gone up, but he is retaining fluid and is unable to sleep. Per Dr Melford Aase, leave off the Florinef tomorrow, since you already took the medication today, and restart the med at 3 days a week.  The spouse is aware of changes.

## 2016-01-11 ENCOUNTER — Other Ambulatory Visit: Payer: Self-pay | Admitting: Internal Medicine

## 2016-01-11 ENCOUNTER — Telehealth: Payer: Self-pay | Admitting: *Deleted

## 2016-01-11 NOTE — Telephone Encounter (Signed)
Patient's spouse called and states he is having pain and his appointment with Dr Naaman Plummer is in about 4-6 weeks.  Per Dr Melford Aase, he can increase his Gabapentin 600 mg to up to 6-8 tablets daily for pain.  The patient's spouse is aware and states just 600 mg makes him unable to function, but she will discuss with the patient.

## 2016-01-14 ENCOUNTER — Telehealth: Payer: Self-pay | Admitting: *Deleted

## 2016-01-14 NOTE — Telephone Encounter (Signed)
Duplicate/error

## 2016-01-14 NOTE — Telephone Encounter (Signed)
Patient's spouse call and states he fel at 2 AM today and hit his head.  He has a laceration on the back of his head that is not gapped open and has stopped bleeding .  Per Dr Melford Aase, it is OK to apply a bandaid, since the bleeding has stopped.  Also, she thinks his Gabapentin may be making him dizzy.  Per Dr Melford Aase, he should sit on the side of the bed and count to 10 slowly before standing to help with the dizzy feeling. He is taking Gabapentin 600 mg in the AM and PM and 300 mg in the middle of the day, with some pain relief.  Per Dr Melford Aase, he can take up to 6 tablets daily, if he can tolerate them. His spouse is aware of all the instructions.

## 2016-01-27 ENCOUNTER — Other Ambulatory Visit: Payer: Self-pay | Admitting: Internal Medicine

## 2016-01-27 ENCOUNTER — Encounter: Payer: Self-pay | Admitting: Internal Medicine

## 2016-01-27 ENCOUNTER — Ambulatory Visit (INDEPENDENT_AMBULATORY_CARE_PROVIDER_SITE_OTHER): Payer: Medicare Other | Admitting: Internal Medicine

## 2016-01-27 VITALS — BP 120/74 | HR 80 | Temp 97.3°F | Resp 16 | Ht 72.5 in | Wt 195.8 lb

## 2016-01-27 DIAGNOSIS — Z23 Encounter for immunization: Secondary | ICD-10-CM

## 2016-01-27 DIAGNOSIS — G548 Other nerve root and plexus disorders: Secondary | ICD-10-CM

## 2016-01-27 DIAGNOSIS — G588 Other specified mononeuropathies: Secondary | ICD-10-CM

## 2016-01-27 DIAGNOSIS — I951 Orthostatic hypotension: Secondary | ICD-10-CM

## 2016-01-27 MED ORDER — DEXAMETHASONE SODIUM PHOSPHATE 10 MG/ML IJ SOLN
10.0000 mg | Freq: Once | INTRAMUSCULAR | Status: AC
  Administered 2016-01-27: 10 mg

## 2016-01-27 MED ORDER — AMITRIPTYLINE HCL 10 MG PO TABS: ORAL_TABLET | ORAL | 1 refills | Status: DC

## 2016-01-27 NOTE — Progress Notes (Signed)
Subjective:    Patient ID: [name], male    DOB: 1933/12/10, 83 y.o.   MRN: [id number]  HPI   This very nice 82 yo MWM w/HTN  s/p recent CABG in March has had difficulties with dysautonomia and unstable BP's for which he had recently been rstarted on Florinef for postural BP's of 86/56 and then as he again became hypertensive ~ 99991111 systolic, he again stopped his Florinef. Patient also was intolerant of a trial of increasing his Gabapentin for his R T 4/5 Intercostal Neuralgia. Patient is awaiting confirmation of referral for pain management and requests a trigger point injection.   Medication Sig  . Acetaminophen (TYLENOL PO) Take 2 tablets by mouth as needed.  Marland Kitchen aspirin EC 81 MG EC tablet Take 1 tablet (81 mg total) by mouth daily.  Marland Kitchen atorvastatin (LIPITOR) 80 MG tablet Take 1/2 to 1 tablet daily for Cholesterol or as directed  . Bisacodyl (DULCOLAX PO) Take 1 capsule by mouth as needed.  Marland Kitchen CALCIUM PO Take 1 tablet by mouth daily.  . Flaxseed, Linseed, 1000 MG CAPS Take 1,000 mg by mouth daily.   . Gabapentin 600 MG tablet TK 1/2 TO 1 T PO TID PRF PAIN - not taking   . Magnesium 250 MG TABS Take 250 mg by mouth daily.  . multivitamin (THERAGRAN) per tablet Take 1 tablet by mouth daily.    Marland Kitchen omeprazole (PRILOSEC) 20 MG capsule Take 20 mg by mouth daily as needed.  . ONE TOUCH LANCETS MISC check blood sugar 1 time daily-DX-R73.03.  . ONE TOUCH ULTRA TEST test strip CHECK BLOOD GLUCOSE 1 TIME DAILY   No facility-administered medications prior to visit.    All Else - CBC - Kidneys - Electrolytes -  Liver - Magnesium & Thyroid all Nl/OK  Past Medical History:  Diagnosis Date  . 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 Colon polyps   . Coronary atherosclerosis of unspecified type of vessel, native or graft   . Diabetes mellitus    Diet control   . Esophageal reflux   . Gastritis, chronic   . Hypertension   . IBS (irritable bowel syndrome)   . Other and unspecified hyperlipidemia    . Personal history of other diseases of digestive system   . Vitamin B 12 deficiency   . Vitamin D deficiency    Past Surgical History:  Procedure Laterality Date  . BLADDER SURGERY    . CARDIAC CATHETERIZATION N/A 07/01/2015   Procedure: Left Heart Cath and Coronary Angiography;  Surgeon: Wellington Hampshire, MD;  Location: [place] [facility];  Service: Cardiovascular;  Laterality: N/A;  . CORONARY ANGIOPLASTY WITH STENT PLACEMENT    . CORONARY ARTERY BYPASS GRAFT N/A 07/06/2015   Procedure: CORONARY ARTERY BYPASS GRAFTING (CABG)x 4   utilizing the left internal mammary artery and endoscopically harvested bilateral  sapheneous vein.;  Surgeon: Ivin Poot, MD;  Location: [place];  Service: Open Heart Surgery;  Laterality: N/A;  . KNEE SURGERY    . TEE WITHOUT CARDIOVERSION N/A 07/06/2015   Procedure: TRANSESOPHAGEAL ECHOCARDIOGRAM (TEE);  Surgeon: Ivin Poot, MD;  Location: [place];  Service: Open Heart Surgery;  Laterality: N/A;   Review of Systems  10 point systems review negative except as above.     Objective:   Physical Exam  BP 120/74   Pulse 80   Temp 97.3 F (36.3 C)   Resp 16   Ht 6' 0.5" (1.842 m)   Wt  195 lb 12.8 oz (88.8 kg)   BMI 26.19 kg/m    Rechecked sitting BP 156/94, P 77 and standing BP 122/83, P 86 shows orthostatic drop.  Appears in moderate discomfort.  HEENT - Eac's patent. TM's Nl. EOM's full. PERRLA. NasoOroPharynx clear. Neck - supple. Nl Thyroid. Carotids 2+ & No bruits, nodes, JVD Chest - Clear equal BS w/o Rales, rhonchi, wheezes. Cor - Nl HS. RRR w/o sig MGR. PP 1(+). No edema. Abd - No palpable organomegaly, masses or tenderness. BS nl. MS- FROM w/o deformities. Muscle power, tone and bulk Nl. Gait Nl. Neuro - No obvious Cr N abnormalities. Sensory, motor and Cerebellar functions appear Nl w/o focal abnormalities.  Procedure : After informed consent  a 2 cm trigger point in the R T4/5 intercostal space ~ 2" from the midline was identified and  aseptic prep'd  with alcohol. Then the site was infiltrated with 1 ml of Marcaine 0.5% w/epi and then injected with 95ml (10 mg ) Dexamethasone and 1 ml Marcaine 0.5% w/epi with immediate resolution of his pain. Sterile Bandaid was applied.     Assessment & Plan:   1. Orthostatic hypotension  - recommended restart Florinef QOD and also recommended monitor standing BP's 2 x/day. Advised pt's wife & patient most likely will have to accept a degree of supine hypertension to avoid postural syncope.   2. Intercostal neuralgia  - dexamethasone (DECADRON) injection 10 mg; 1 mL (10 mg total) - trigger point injection - Pain Mgmt for intercostal injection  3. Need for prophylactic vaccination and inoculation against influenza  - Flu vaccine HIGH DOSE PF (Fluzone High dose)

## 2016-02-03 ENCOUNTER — Encounter: Payer: Self-pay | Admitting: Physical Medicine & Rehabilitation

## 2016-02-04 ENCOUNTER — Emergency Department (HOSPITAL_COMMUNITY)
Admission: EM | Admit: 2016-02-04 | Discharge: 2016-02-04 | Disposition: A | Discharge status: Home / Self Care | Payer: Medicare Other | Attending: Emergency Medicine | Admitting: Emergency Medicine

## 2016-02-04 ENCOUNTER — Emergency Department (HOSPITAL_COMMUNITY): Payer: Medicare Other

## 2016-02-04 ENCOUNTER — Encounter (HOSPITAL_COMMUNITY): Payer: Self-pay

## 2016-02-04 ENCOUNTER — Telehealth: Payer: Self-pay | Admitting: *Deleted

## 2016-02-04 DIAGNOSIS — I252 Old myocardial infarction: Secondary | ICD-10-CM | POA: Insufficient documentation

## 2016-02-04 DIAGNOSIS — Z87891 Personal history of nicotine dependence: Secondary | ICD-10-CM | POA: Diagnosis not present

## 2016-02-04 DIAGNOSIS — Z951 Presence of aortocoronary bypass graft: Secondary | ICD-10-CM | POA: Diagnosis not present

## 2016-02-04 DIAGNOSIS — E119 Type 2 diabetes mellitus without complications: Secondary | ICD-10-CM | POA: Diagnosis not present

## 2016-02-04 DIAGNOSIS — E2740 Unspecified adrenocortical insufficiency: Secondary | ICD-10-CM | POA: Insufficient documentation

## 2016-02-04 DIAGNOSIS — I959 Hypotension, unspecified: Secondary | ICD-10-CM

## 2016-02-04 DIAGNOSIS — Z7982 Long term (current) use of aspirin: Secondary | ICD-10-CM | POA: Insufficient documentation

## 2016-02-04 DIAGNOSIS — R531 Weakness: Secondary | ICD-10-CM | POA: Diagnosis not present

## 2016-02-04 DIAGNOSIS — I2510 Atherosclerotic heart disease of native coronary artery without angina pectoris: Secondary | ICD-10-CM | POA: Insufficient documentation

## 2016-02-04 LAB — COMPREHENSIVE METABOLIC PANEL
ALT: 11 U/L — ABNORMAL LOW (ref 17–63)
AST: 21 U/L (ref 15–41)
Albumin: 3.4 g/dL — ABNORMAL LOW (ref 3.5–5.0)
Alkaline Phosphatase: 73 U/L (ref 38–126)
Anion gap: 6 (ref 5–15)
BUN: 21 mg/dL — ABNORMAL HIGH (ref 6–20)
CO2: 30 mmol/L (ref 22–32)
Calcium: 9.1 mg/dL (ref 8.9–10.3)
Chloride: 102 mmol/L (ref 101–111)
Creatinine, Ser: 1.59 mg/dL — ABNORMAL HIGH (ref 0.61–1.24)
GFR calc Af Amer: 46 mL/min — ABNORMAL LOW (ref >60)
GFR calc non Af Amer: 39 mL/min — ABNORMAL LOW (ref >60)
Glucose, Bld: 156 mg/dL — ABNORMAL HIGH (ref 65–99)
Potassium: 4.3 mmol/L (ref 3.5–5.1)
Sodium: 138 mmol/L (ref 135–145)
Total Bilirubin: 0.9 mg/dL (ref 0.3–1.2)
Total Protein: 6 g/dL — ABNORMAL LOW (ref 6.5–8.1)

## 2016-02-04 LAB — I-STAT TROPONIN, ED: Troponin i, poc: 0 ng/mL (ref 0.00–0.08)

## 2016-02-04 LAB — CBC WITH DIFFERENTIAL/PLATELET
Basophils Absolute: 0 10*3/uL (ref 0.0–0.1)
Basophils Relative: 0 %
Eosinophils Absolute: 0.3 10*3/uL (ref 0.0–0.7)
Eosinophils Relative: 4 %
HCT: 35.5 % — ABNORMAL LOW (ref 39.0–52.0)
Hemoglobin: 11.1 g/dL — ABNORMAL LOW (ref 13.0–17.0)
Lymphocytes Relative: 25 %
Lymphs Abs: 2 10*3/uL (ref 0.7–4.0)
MCH: 29.3 pg (ref 26.0–34.0)
MCHC: 31.3 g/dL (ref 30.0–36.0)
MCV: 93.7 fL (ref 78.0–100.0)
Monocytes Absolute: 0.5 10*3/uL (ref 0.1–1.0)
Monocytes Relative: 6 %
Neutro Abs: 5.2 10*3/uL (ref 1.7–7.7)
Neutrophils Relative %: 65 %
Platelets: 188 10*3/uL (ref 150–400)
RBC: 3.79 MIL/uL — ABNORMAL LOW (ref 4.22–5.81)
RDW: 14.7 % (ref 11.5–15.5)
WBC: 8 10*3/uL (ref 4.0–10.5)

## 2016-02-04 MED ORDER — METHYLPREDNISOLONE SODIUM SUCC 125 MG IJ SOLR
125.0000 mg | Freq: Once | INTRAMUSCULAR | Status: AC
  Administered 2016-02-04: 125 mg via INTRAVENOUS
  Filled 2016-02-04: qty 2

## 2016-02-04 MED ORDER — SODIUM CHLORIDE 0.9 % IV BOLUS (SEPSIS)
1000.0000 mL | Freq: Once | INTRAVENOUS | Status: AC
  Administered 2016-02-04: 1000 mL via INTRAVENOUS

## 2016-02-04 NOTE — ED Triage Notes (Signed)
Patient complains of ongoing weakness and orthostatic hypotension for several weeks. Now increased BP and BP dropping from 123456 systolic to 70 when standing. Also has ongoing right sided rib pain that MD's relate to nerve pain, had heart surgery this past march. Slightly pale. Alert and oriented, orthostatic at triage

## 2016-02-04 NOTE — ED Provider Notes (Signed)
[facility] DEPT Provider Note   CSN: [id number] Arrival date & time: 02/04/16  1019     History   Chief Complaint Chief Complaint  Patient presents with  . Weakness    HPI [name] is a 82 y.o. male.  Patient is an 82 year old male with past mental history of coronary artery disease with CABG 4 in 2017. He presents today for evaluation of weakness and low blood pressure. He woke up this morning and attempted to walk, however felt extremely weak and lightheaded. His wife checked his blood pressure and it was very low. He denies any nausea, vomiting, or diarrhea. He denies any chest pain or difficulty breathing. He does report having pain in his right chest wall that has been ongoing since his surgery. He is due to see pain management about a potential nerve block. Gabapentin and amitriptyline have not been helping his pain.   The history is provided by the patient.  Weakness  Primary symptoms comment: Generalized weakness. This is a new problem. The current episode started 3 to 5 hours ago. The problem has not changed since onset.There has been no fever. Pertinent negatives include no shortness of breath and no chest pain.    Past Medical History:  Diagnosis Date  . 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 Colon polyps   . Coronary atherosclerosis of unspecified type of vessel, native or graft   . Diabetes mellitus    Diet control   . Esophageal reflux   . Gastritis, chronic   . Hypertension   . IBS (irritable bowel syndrome)   . Other and unspecified hyperlipidemia   . Personal history of other diseases of digestive system   . Vitamin B 12 deficiency   . Vitamin D deficiency     Patient Active Problem List   Diagnosis Date Noted  . 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 Medication management 08/11/2015  . Orthostatic hypotension 07/24/2015  . 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 NSTEMI (non-ST elevated myocardial infarction) ([place]) 07/01/2015  . BMI 25.0-25.9,adult  02/02/2015  . PVD (peripheral vascular disease) ([place]) 01/01/2014  . Hyperlipidemia 04/11/2013  . Prediabetes 04/11/2013  . Hypertension   . Vitamin D deficiency   . Vitamin B 12 deficiency   . Coronary atherosclerosis- s/p PCI to LAD in 2009 and PCI to RCA in 2011 02/27/2009  . Abdominal aortic aneurysm ([place]) 02/27/2009  . GERD 02/27/2009  . History of IBS 02/27/2009    Past Surgical History:  Procedure Laterality Date  . BLADDER SURGERY    . CARDIAC CATHETERIZATION N/A 07/01/2015   Procedure: Left Heart Cath and Coronary Angiography;  Surgeon: Wellington Hampshire, MD;  Location: [facility];  Service: Cardiovascular;  Laterality: N/A;  . CORONARY ANGIOPLASTY WITH STENT PLACEMENT    . CORONARY ARTERY BYPASS GRAFT N/A 07/06/2015   Procedure: CORONARY ARTERY BYPASS GRAFTING (CABG)x 4   utilizing the left internal mammary artery and endoscopically harvested bilateral  sapheneous vein.;  Surgeon: Ivin Poot, MD;  Location: [facility];  Service: Open Heart Surgery;  Laterality: N/A;  . KNEE SURGERY    . TEE WITHOUT CARDIOVERSION N/A 07/06/2015   Procedure: TRANSESOPHAGEAL ECHOCARDIOGRAM (TEE);  Surgeon: Ivin Poot, MD;  Location: [facility];  Service: Open Heart Surgery;  Laterality: N/A;       Home Medications    Prior to Admission medications   Medication Sig Start Date End Date Taking? Authorizing Provider  Acetaminophen (TYLENOL PO) Take 2 tablets by mouth  as needed.    Historical Provider, MD  amitriptyline (ELAVIL) 10 MG tablet TAKE 1 TABLET BY MOUTH THREE TIMES DAILY AS NEEDED FOR NEURITIS PAIN 01/28/16   [name], MD  aspirin EC 81 MG EC tablet Take 1 tablet (81 mg total) by mouth daily. 07/11/15   Wayne E Gold, PA-C  atorvastatin (LIPITOR) 80 MG tablet Take 1/2 to 1 tablet daily for Cholesterol or as directed 10/30/15   [name], MD  Bisacodyl (DULCOLAX PO) Take 1 capsule by mouth as needed.    Historical Provider, MD  CALCIUM PO Take 1 tablet by mouth daily.     Historical Provider, MD  Flaxseed, Linseed, 1000 MG CAPS Take 1,000 mg by mouth daily.     Historical Provider, MD  gabapentin (NEURONTIN) 600 MG tablet TK 1/2 TO 1 T PO TID PRF PAIN 12/24/15   Historical Provider, MD  Magnesium 250 MG TABS Take 250 mg by mouth daily.    Historical Provider, MD  multivitamin [facility]) per tablet Take 1 tablet by mouth daily.      Historical Provider, MD  omeprazole (PRILOSEC) 20 MG capsule Take 20 mg by mouth daily as needed.    Historical Provider, MD  [place] check blood sugar 1 time daily-DX-R73.03. 07/29/15   [name], MD  ONE TOUCH ULTRA TEST test strip CHECK BLOOD GLUCOSE 1 TIME DAILY 07/30/15   [name], MD    Family History Family History  Problem Relation Age of Onset  . Heart attack Father     died age 43  . Heart attack Brother     died age 52  . Heart attack Sister     died age 71  . Colon cancer Sister   . Liver cancer Sister   . Diabetes Maternal Grandmother   . Colon polyps Sister     and brothers x 2     Social History Social History  Substance Use Topics  . Smoking status: Former Smoker    Quit date: 07/16/1963  . Smokeless tobacco: Never Used  . Alcohol use No     Allergies   Cymbalta [duloxetine hcl]; Keflex [cephalexin]; Simvastatin; and Sudafed [pseudoephedrine]   Review of Systems Review of Systems  Respiratory: Negative for shortness of breath.   Cardiovascular: Negative for chest pain.  Neurological: Positive for weakness.  All other systems reviewed and are negative.    Physical Exam Updated Vital Signs BP 124/67 (BP Location: Left Arm)   Pulse 97   Temp 97.9 F (36.6 C) (Oral)   Resp 16   Ht 6' (1.829 m)   Wt 190 lb (86.2 kg)   SpO2 99%   BMI 25.77 kg/m   Physical Exam  Constitutional: He is oriented to person, place, and time. He appears well-developed and well-nourished. No distress.  HENT:  Head: Normocephalic and atraumatic.  Mouth/Throat: Oropharynx is clear  and moist.  Eyes: EOM are normal. Pupils are equal, round, and reactive to light.  Neck: Normal range of motion. Neck supple.  Cardiovascular: Normal rate and regular rhythm.  Exam reveals no friction rub.   No murmur heard. Pulmonary/Chest: Effort normal and breath sounds normal. No respiratory distress. He has no wheezes. He has no rales.  Abdominal: Soft. Bowel sounds are normal. He exhibits no distension. There is no tenderness.  Musculoskeletal: Normal range of motion. He exhibits no edema.  Neurological: He is alert and oriented to person, place, and time. Coordination normal.  Skin: Skin is warm and  dry. He is not diaphoretic.  Nursing note and vitals reviewed.    ED Treatments / Results  Labs (all labs ordered are listed, but only abnormal results are displayed) Labs Reviewed  COMPREHENSIVE METABOLIC PANEL  CBC WITH DIFFERENTIAL/PLATELET  Randolm Idol, ED    EKG  EKG Interpretation  Date/Time:  Thursday February 04 2016 10:46:28 EDT Ventricular Rate:  96 PR Interval:    QRS Duration: 103 QT Interval:  385 QTC Calculation: 487 R Axis:   76 Text Interpretation:  Sinus rhythm Prolonged PR interval Borderline prolonged QT interval No significant change since 07/07/2015 Confirmed by Damika Harmon  MD, Yochanan Eddleman ([id number]) on 02/04/2016 11:07:05 AM       Radiology No results found.  Procedures Procedures (including critical care time)  Medications Ordered in ED Medications  sodium chloride 0.9 % bolus 1,000 mL (not administered)     Initial Impression / Assessment and Plan / ED Course  I have reviewed the triage vital signs and the nursing notes.  Pertinent labs & imaging results that were available during my care of the patient were reviewed by me and considered in my medical decision making (see chart for details).  Clinical Course    Patient with orthostatic hypotension. He has a history of same and was previously on a daily dose of a steroid. Several weeks ago this  was decreased to 3 times daily. Since that time he has been having episodes of hypotension and feeling weak. This morning his blood pressure was [id number] systolic on standing.  His laboratory studies here are unremarkable and EKG is unchanged. He was given a dose of Solu-Medrol and his blood pressure is now [id number] systolic upon standing. He is no longer dizzy or orthostatic. He appears to be adrenally insufficient. I will advise him to resume taking a daily dose of the steroid that was previously prescribed. He is to follow-up with his primary doctor for a recheck in the next week.  Final Clinical Impressions(s) / ED Diagnoses   Final diagnoses:  None    New Prescriptions New Prescriptions   No medications on file     Veryl Speak, MD 02/04/16 1358

## 2016-02-04 NOTE — Discharge Planning (Signed)
EDCM reviewed discharging chart for possible CM needs.  No needs identified.    

## 2016-02-04 NOTE — Discharge Instructions (Signed)
Begin taking your Florinef daily instead of 3 times weekly.  Keep a record of your blood pressures over the next week, and follow-up with your primary Dr. to have this rechecked.  Return to the emergency department if your symptoms significantly worsen or change.

## 2016-02-04 NOTE — Telephone Encounter (Signed)
Patient called and states his BP is 77/47 while sitting.  He complained of leg weakness and is using a walker to walk.  He is taking Amitriptyline 10 mg tid and Gabapentin 300 mg tid. Per Dr Melford Aase, the patient needs to go to the ER for fluids for the low BP and he can stop the other meds if he cannot tolerate them.

## 2016-02-04 NOTE — ED Notes (Signed)
MD at bedside. 

## 2016-02-09 ENCOUNTER — Ambulatory Visit (INDEPENDENT_AMBULATORY_CARE_PROVIDER_SITE_OTHER): Payer: Medicare Other | Admitting: Internal Medicine

## 2016-02-09 ENCOUNTER — Encounter: Payer: Self-pay | Admitting: Internal Medicine

## 2016-02-09 VITALS — BP 88/56 | HR 98 | Temp 98.0°F | Resp 16 | Ht 72.5 in | Wt 190.0 lb

## 2016-02-09 DIAGNOSIS — R7303 Prediabetes: Secondary | ICD-10-CM | POA: Diagnosis not present

## 2016-02-09 DIAGNOSIS — Z951 Presence of aortocoronary bypass graft: Secondary | ICD-10-CM

## 2016-02-09 DIAGNOSIS — I714 Abdominal aortic aneurysm, without rupture, unspecified: Secondary | ICD-10-CM

## 2016-02-09 DIAGNOSIS — Z0001 Encounter for general adult medical examination with abnormal findings: Secondary | ICD-10-CM

## 2016-02-09 DIAGNOSIS — E538 Deficiency of other specified B group vitamins: Secondary | ICD-10-CM

## 2016-02-09 DIAGNOSIS — I214 Non-ST elevation (NSTEMI) myocardial infarction: Secondary | ICD-10-CM | POA: Diagnosis not present

## 2016-02-09 DIAGNOSIS — I10 Essential (primary) hypertension: Secondary | ICD-10-CM | POA: Diagnosis not present

## 2016-02-09 DIAGNOSIS — I739 Peripheral vascular disease, unspecified: Secondary | ICD-10-CM | POA: Diagnosis not present

## 2016-02-09 DIAGNOSIS — K219 Gastro-esophageal reflux disease without esophagitis: Secondary | ICD-10-CM | POA: Diagnosis not present

## 2016-02-09 DIAGNOSIS — R6889 Other general symptoms and signs: Secondary | ICD-10-CM | POA: Diagnosis not present

## 2016-02-09 DIAGNOSIS — Z8719 Personal history of other diseases of the digestive system: Secondary | ICD-10-CM

## 2016-02-09 DIAGNOSIS — Z6825 Body mass index (BMI) 25.0-25.9, adult: Secondary | ICD-10-CM

## 2016-02-09 DIAGNOSIS — I25110 Atherosclerotic heart disease of native coronary artery with unstable angina pectoris: Secondary | ICD-10-CM

## 2016-02-09 DIAGNOSIS — E782 Mixed hyperlipidemia: Secondary | ICD-10-CM

## 2016-02-09 DIAGNOSIS — Z79899 Other long term (current) drug therapy: Secondary | ICD-10-CM | POA: Diagnosis not present

## 2016-02-09 DIAGNOSIS — I951 Orthostatic hypotension: Secondary | ICD-10-CM | POA: Diagnosis not present

## 2016-02-09 DIAGNOSIS — G588 Other specified mononeuropathies: Secondary | ICD-10-CM

## 2016-02-09 DIAGNOSIS — E559 Vitamin D deficiency, unspecified: Secondary | ICD-10-CM

## 2016-02-09 DIAGNOSIS — Z0000 Encounter for general adult medical examination without abnormal findings: Secondary | ICD-10-CM

## 2016-02-09 LAB — CBC WITH DIFFERENTIAL/PLATELET
Basophils Absolute: 0 cells/uL (ref 0–200)
Basophils Relative: 0 %
Eosinophils Absolute: 273 cells/uL (ref 15–500)
Eosinophils Relative: 3 %
HCT: 36.2 % — ABNORMAL LOW (ref 38.5–50.0)
Hemoglobin: 11.9 g/dL — ABNORMAL LOW (ref 13.2–17.1)
Lymphocytes Relative: 22 %
Lymphs Abs: 2002 cells/uL (ref 850–3900)
MCH: 30.1 pg (ref 27.0–33.0)
MCHC: 32.9 g/dL (ref 32.0–36.0)
MCV: 91.4 fL (ref 80.0–100.0)
MPV: 9.3 fL (ref 7.5–12.5)
Monocytes Absolute: 546 cells/uL (ref 200–950)
Monocytes Relative: 6 %
Neutro Abs: 6279 cells/uL (ref 1500–7800)
Neutrophils Relative %: 69 %
Platelets: 222 10*3/uL (ref 140–400)
RBC: 3.96 MIL/uL — ABNORMAL LOW (ref 4.20–5.80)
RDW: 15.5 % — ABNORMAL HIGH (ref 11.0–15.0)
WBC: 9.1 10*3/uL (ref 3.8–10.8)

## 2016-02-09 LAB — BASIC METABOLIC PANEL WITH GFR
BUN: 21 mg/dL (ref 7–25)
CO2: 30 mmol/L (ref 20–31)
Calcium: 9.6 mg/dL (ref 8.6–10.3)
Chloride: 102 mmol/L (ref 98–110)
Creat: 1.45 mg/dL — ABNORMAL HIGH (ref 0.70–1.11)
GFR, Est African American: 52 mL/min — ABNORMAL LOW (ref >=60)
GFR, Est Non African American: 45 mL/min — ABNORMAL LOW (ref >=60)
Glucose, Bld: 104 mg/dL — ABNORMAL HIGH (ref 65–99)
Potassium: 3.9 mmol/L (ref 3.5–5.3)
Sodium: 140 mmol/L (ref 135–146)

## 2016-02-09 LAB — HEPATIC FUNCTION PANEL
ALT: 9 U/L (ref 9–46)
AST: 17 U/L (ref 10–35)
Albumin: 3.6 g/dL (ref 3.6–5.1)
Alkaline Phosphatase: 81 U/L (ref 40–115)
Bilirubin, Direct: 0.1 mg/dL (ref <=0.2)
Indirect Bilirubin: 0.4 mg/dL (ref 0.2–1.2)
Total Bilirubin: 0.5 mg/dL (ref 0.2–1.2)
Total Protein: 6.2 g/dL (ref 6.1–8.1)

## 2016-02-09 LAB — TSH: TSH: 2.65 mIU/L (ref 0.40–4.50)

## 2016-02-09 NOTE — Progress Notes (Signed)
MEDICARE ANNUAL WELLNESS VISIT AND FOLLOW UP Assessment:    1. Essential hypertension -is very hypotensive  - TSH  2. Orthostatic hypotension - Cont florinef -Sent a message to [employer] PA-C - TSH - ACTH  3. Hyperlipidemia -cont lipitor   4. Prediabetes - - Hemoglobin A1c  5. Vitamin B 12 deficiency -cont Vit B12  6. S/P CABG x 4 -cont following with cardiology  7. Medication management  - CBC with Differential/Platelet - BASIC METABOLIC PANEL WITH GFR - Hepatic function panel  8. Abdominal aortic aneurysm (AAA) without rupture ([place]) -monitored by cardio and vascular  9. Atherosclerosis of native coronary artery of native heart with unstable angina pectoris ([place]) -followed by cardio  10. NSTEMI (non-ST elevated myocardial infarction) ([place]) -followed by cardio  11. PVD (peripheral vascular disease) (HCC) -cont lipitor -cont CBG control  12. Gastroesophageal reflux disease, esophagitis presence not specified -pepcid or zantac as needed  13. Intercostal neuralgia -on gabapentin currently -going to be evaluated by neuro -give  BP problems recommend that we do not escalate dose of gabapentin and feel that patient is not a good candidate for pain medications  14. BMI 25.0-25.9,adult -well controlled BMI  15. History of IBS -currently well controlled  16. Vitamin D deficiency -Cont Vit D    Over 30 minutes of exam, counseling, chart review, and critical decision making was performed  Future Appointments Date Time Provider English  03/22/2016 11:00 AM Meredith Staggers, MD [facility] CPR  05/17/2016 9:30 AM [name], MD [facility] None  11/29/2016 9:00 AM [name], MD [facility] None     Plan:   During the course of the visit the patient was educated and counseled about appropriate screening and preventive services including:    Pneumococcal vaccine   Influenza vaccine  Prevnar 13  Td vaccine  Screening  electrocardiogram  Colorectal cancer screening  Diabetes screening  Glaucoma screening  Nutrition counseling    Subjective:  [name] is a 82 y.o. male who presents for Medicare Annual Wellness Visit and 3 month follow up for HTN, hyperlipidemia, prediabetes, and vitamin D Def.   His blood pressure has not been controlled at home, today their BP is BP: (!) 88/56 He does not workout. He denies chest pain, shortness of breath, dizziness. Patient reports that he is having some really severe orthostatic hypotension.  He is taking the flonif twice daily.  He has not been feeling well for the last several months.  He reports that he is drinking water all the time.  No leg swelling.  He is having a lot of weakness in his legs.  He reports that he has very severe neuropathy.    He is on cholesterol medication and denies myalgias. His cholesterol is not at goal. The cholesterol last visit was:   Lab Results  Component Value Date   CHOL 171 10/29/2015   HDL 31 (L) 10/29/2015   LDLCALC 116 10/29/2015   TRIG 118 10/29/2015   CHOLHDL 5.5 (H) 10/29/2015  He has a history of diabetes which is currently diet and exercise controlled.  It is worsened likely by his use of florinef.  He is currently taking this twice daily.    Lab Results  Component Value Date   HGBA1C 6.8 (H) 10/29/2015   Last GFR Lab Results  Component Value Date   GFRNONAA 39 (L) 02/04/2016     Lab Results  Component Value Date   GFRAA 46 (L) 02/04/2016   Patient is on  Vitamin D supplement.   Lab Results  Component Value Date   VD25OH 92 10/29/2015     He reports that he is due to see a neurologist about his rib pain.  He is still taking gabapentin TID.   Medication Review: Current Outpatient Prescriptions on File Prior to Visit  Medication Sig Dispense Refill  . Acetaminophen (TYLENOL PO) Take 2 tablets by mouth as needed.    Marland Kitchen amitriptyline (ELAVIL) 10 MG tablet TAKE 1 TABLET BY MOUTH THREE TIMES DAILY  AS NEEDED FOR NEURITIS PAIN 270 tablet 1  . aspirin EC 81 MG EC tablet Take 1 tablet (81 mg total) by mouth daily.    Marland Kitchen atorvastatin (LIPITOR) 80 MG tablet Take 1/2 to 1 tablet daily for Cholesterol or as directed 90 tablet 1  . Bisacodyl (DULCOLAX PO) Take 1 capsule by mouth as needed.    Marland Kitchen CALCIUM PO Take 1 tablet by mouth daily.    . Flaxseed, Linseed, 1000 MG CAPS Take 1,300 mg by mouth daily.     . fludrocortisone (FLORINEF) 0.1 MG tablet Take 0.1 mg by mouth every other day.    . gabapentin (NEURONTIN) 300 MG capsule Take 300 mg by mouth 3 (three) times daily.    Marland Kitchen gabapentin (NEURONTIN) 600 MG tablet TK 1/2 TO 1 T PO TID PRF PAIN  5  . Magnesium 250 MG TABS Take 500 mg by mouth daily.     . multivitamin (THERAGRAN) per tablet Take 1 tablet by mouth daily.      Marland Kitchen omeprazole (PRILOSEC) 20 MG capsule Take 20 mg by mouth daily as needed.    . ONE TOUCH LANCETS MISC check blood sugar 1 time daily-DX-R73.03. 200 each 0  . ONE TOUCH ULTRA TEST test strip CHECK BLOOD GLUCOSE 1 TIME DAILY 100 each 12   No current facility-administered medications on file prior to visit.     Allergies: Allergies  Allergen Reactions  . Cymbalta [Duloxetine Hcl]     Dizziness  . Keflex [Cephalexin] Other (See Comments)    Reaction: unknown   . Simvastatin Other (See Comments)    Reaction: unknown   . Sudafed [Pseudoephedrine]     Dizziness    Current Problems (verified) has Coronary atherosclerosis- s/p PCI to LAD in 2009 and PCI to RCA in 2011; Abdominal aortic aneurysm ([place]); GERD; History of IBS; Hypertension; Vitamin D deficiency; Vitamin B 12 deficiency; Hyperlipidemia; Prediabetes; PVD (peripheral vascular disease) ([place]); BMI 25.0-25.9,adult; NSTEMI (non-ST elevated myocardial infarction) ([place]); S/P CABG x 4; Orthostatic hypotension; Medication management; and Intercostal neuralgia on his problem list.  Screening Tests Immunization History  Administered Date(s) Administered  . Influenza, High  Dose Seasonal PF 12/30/2013, 02/02/2015, 01/27/2016  . Influenza-Unspecified 01/13/2013  . Pneumococcal Conjugate-13 02/14/2014  . Pneumococcal-Unspecified 06/15/2004  . Tetanus 10/01/2012  . Zoster 10/01/2012    Preventative care: Last colonoscopy: 2012  Prior vaccinations: TD or Tdap: 2014  Influenza: 2017  Pneumococcal: 2006 Prevnar13: 2015 Shingles/Zostavax: 2014  Names of Other Physician/Practitioners you currently use: 1. [facility] here for primary care 2. Has not been to eye doctor recently per his report, eye doctor, last visit  3. Does not currently see one Patient Care Team: [name], MD as PCP - [place], PA-C as Physician Assistant (Cardiology) Lelon Perla, MD as Consulting Physician (Cardiology) Irene Shipper, MD as Consulting Physician (Gastroenterology)  Surgical: He  has a past surgical history that includes Knee surgery; Bladder surgery; Coronary angioplasty  with stent; Cardiac catheterization (N/A, 07/01/2015); Coronary artery bypass graft (N/A, 07/06/2015); and TEE without cardioversion (N/A, 07/06/2015). Family His family history includes Colon cancer in his sister; Colon polyps in his sister; Diabetes in his maternal grandmother; Heart attack in his brother, father, and sister; Liver cancer in his sister. Social history  He reports that he quit smoking about 52 years ago. He has never used smokeless tobacco. He reports that he does not drink alcohol or use drugs.  MEDICARE WELLNESS OBJECTIVES: Physical activity: Current Exercise Habits: The patient does not participate in regular exercise at present, Exercise limited by: cardiac condition(s) Cardiac risk factors: Cardiac Risk Factors include: advanced age (>48men, >62 women);diabetes mellitus;dyslipidemia;family history of premature cardiovascular disease;hypertension;male gender;sedentary lifestyle Depression/mood screen:   Depression screen [facility] 2/9  02/09/2016  Decreased Interest 0  Down, Depressed, Hopeless 0  PHQ - 2 Score 0    ADLs:  In your present state of health, do you have any difficulty performing the following activities: 02/09/2016 10/29/2015  Hearing? N N  Vision? N N  Difficulty concentrating or making decisions? N N  Walking or climbing stairs? Y N  Dressing or bathing? Y N  Doing errands, shopping? Y -  Conservation officer, nature and eating ? N -  Using the Toilet? N -  In the past six months, have you accidently leaked urine? N -  Do you have problems with loss of bowel control? N -  Managing your Medications? N -  Managing your Finances? N -  Housekeeping or managing your Housekeeping? Y -  Some recent data might be hidden     Cognitive Testing  Alert? Yes  Normal Appearance?Yes  Oriented to person? Yes  Place? Yes   Time? Yes  Recall of three objects?  Yes  Can perform simple calculations? Yes  Displays appropriate judgment?Yes  Can read the correct time from a watch face?Yes  EOL planning: Does patient have an advance directive?: No Would patient like information on creating an advanced directive?: No - patient declined information   Objective:   Today's Vitals   02/09/16 0824  BP: (!) 88/56  Pulse: 98  Resp: 16  Temp: 98 F (36.7 C)  TempSrc: Temporal  Weight: 190 lb (86.2 kg)  Height: 6' 0.5" (1.842 m)   Body mass index is 25.41 kg/m.  General appearance: alert, no distress, WD/WN, male HEENT: normocephalic, sclerae anicteric, TMs pearly, nares patent, no discharge or erythema, pharynx normal Oral cavity: MMM, no lesions Neck: supple, no lymphadenopathy, no thyromegaly, no masses Heart: RRR, normal S1, S2, no murmurs Lungs: CTA bilaterally, no wheezes, rhonchi, or rales Abdomen: +bs, soft, non tender, non distended, no masses, no hepatomegaly, no splenomegaly Musculoskeletal: nontender, no swelling, no obvious deformity Extremities: no edema, no cyanosis, no clubbing Pulses: 2+ symmetric,  upper and lower extremities, normal cap refill Neurological: alert, oriented x 3, CN2-12 intact, strength normal upper extremities and lower extremities, sensation normal throughout, DTRs 2+ throughout, no cerebellar signs, gait normal Psychiatric: normal affect, behavior normal, pleasant   Medicare Attestation I have personally reviewed: The patient's medical and social history Their use of alcohol, tobacco or illicit drugs Their current medications and supplements The patient's functional ability including ADLs,fall risks, home safety risks, cognitive, and hearing and visual impairment Diet and physical activities Evidence for depression or mood disorders  The patient's weight, height, BMI, and visual acuity have been recorded in the chart.  I have made referrals, counseling, and provided education to the patient based on review  of the above and I have provided the patient with a written personalized care plan for preventive services.     Starlyn Skeans, PA-C   02/09/2016

## 2016-02-10 LAB — HEMOGLOBIN A1C
Hgb A1c MFr Bld: 6.2 % — ABNORMAL HIGH (ref <5.7)
Mean Plasma Glucose: 131 mg/dL

## 2016-02-11 ENCOUNTER — Other Ambulatory Visit: Payer: Self-pay | Admitting: Cardiology

## 2016-02-11 ENCOUNTER — Telehealth: Payer: Self-pay | Admitting: Cardiology

## 2016-02-11 MED ORDER — MIDODRINE HCL 2.5 MG PO TABS: 2.5000 mg | ORAL_TABLET | Freq: Three times a day (TID) | ORAL | Status: DC

## 2016-02-11 NOTE — Telephone Encounter (Signed)
The pt says his B/P drops to 60's when he is up and this is associated with dizziness. Discussed with Pt- Dr Stanford Breed suggest we try Midodrine again.  Kerin Ransom PA-C 02/11/2016 3:55 PM

## 2016-02-12 LAB — ACTH: C206 ACTH: 19 pg/mL (ref 6–50)

## 2016-02-17 ENCOUNTER — Emergency Department (HOSPITAL_COMMUNITY): Payer: Medicare Other

## 2016-02-17 ENCOUNTER — Encounter (HOSPITAL_COMMUNITY): Payer: Self-pay

## 2016-02-17 ENCOUNTER — Encounter: Payer: Self-pay | Admitting: Internal Medicine

## 2016-02-17 ENCOUNTER — Ambulatory Visit (INDEPENDENT_AMBULATORY_CARE_PROVIDER_SITE_OTHER): Payer: Medicare Other | Admitting: Internal Medicine

## 2016-02-17 ENCOUNTER — Observation Stay (HOSPITAL_COMMUNITY)
Admission: EM | Admit: 2016-02-17 | Discharge: 2016-02-20 | Disposition: A | Discharge status: Home / Self Care | Payer: Medicare Other | Attending: Internal Medicine | Admitting: Internal Medicine

## 2016-02-17 ENCOUNTER — Other Ambulatory Visit: Payer: Self-pay | Admitting: Internal Medicine

## 2016-02-17 ENCOUNTER — Encounter (HOSPITAL_COMMUNITY): Payer: Self-pay | Admitting: Internal Medicine

## 2016-02-17 VITALS — BP 100/62 | HR 88 | Temp 97.5°F | Resp 16 | Ht 72.5 in | Wt 198.6 lb

## 2016-02-17 DIAGNOSIS — Z951 Presence of aortocoronary bypass graft: Secondary | ICD-10-CM | POA: Insufficient documentation

## 2016-02-17 DIAGNOSIS — N183 Chronic kidney disease, stage 3 unspecified: Secondary | ICD-10-CM

## 2016-02-17 DIAGNOSIS — I951 Orthostatic hypotension: Principal | ICD-10-CM | POA: Insufficient documentation

## 2016-02-17 DIAGNOSIS — I129 Hypertensive chronic kidney disease with stage 1 through stage 4 chronic kidney disease, or unspecified chronic kidney disease: Secondary | ICD-10-CM | POA: Diagnosis not present

## 2016-02-17 DIAGNOSIS — Z87891 Personal history of nicotine dependence: Secondary | ICD-10-CM | POA: Insufficient documentation

## 2016-02-17 DIAGNOSIS — K219 Gastro-esophageal reflux disease without esophagitis: Secondary | ICD-10-CM | POA: Diagnosis not present

## 2016-02-17 DIAGNOSIS — S0990XA Unspecified injury of head, initial encounter: Secondary | ICD-10-CM | POA: Diagnosis not present

## 2016-02-17 DIAGNOSIS — S299XXA Unspecified injury of thorax, initial encounter: Secondary | ICD-10-CM | POA: Diagnosis not present

## 2016-02-17 DIAGNOSIS — I2510 Atherosclerotic heart disease of native coronary artery without angina pectoris: Secondary | ICD-10-CM | POA: Diagnosis not present

## 2016-02-17 DIAGNOSIS — E222 Syndrome of inappropriate secretion of antidiuretic hormone: Secondary | ICD-10-CM | POA: Insufficient documentation

## 2016-02-17 DIAGNOSIS — K589 Irritable bowel syndrome without diarrhea: Secondary | ICD-10-CM | POA: Diagnosis not present

## 2016-02-17 DIAGNOSIS — E1122 Type 2 diabetes mellitus with diabetic chronic kidney disease: Secondary | ICD-10-CM

## 2016-02-17 DIAGNOSIS — E785 Hyperlipidemia, unspecified: Secondary | ICD-10-CM | POA: Insufficient documentation

## 2016-02-17 DIAGNOSIS — Z7982 Long term (current) use of aspirin: Secondary | ICD-10-CM | POA: Diagnosis not present

## 2016-02-17 DIAGNOSIS — I495 Sick sinus syndrome: Secondary | ICD-10-CM | POA: Insufficient documentation

## 2016-02-17 DIAGNOSIS — E2740 Unspecified adrenocortical insufficiency: Secondary | ICD-10-CM | POA: Diagnosis not present

## 2016-02-17 DIAGNOSIS — I25810 Atherosclerosis of coronary artery bypass graft(s) without angina pectoris: Secondary | ICD-10-CM

## 2016-02-17 DIAGNOSIS — G629 Polyneuropathy, unspecified: Secondary | ICD-10-CM | POA: Diagnosis not present

## 2016-02-17 DIAGNOSIS — E119 Type 2 diabetes mellitus without complications: Secondary | ICD-10-CM

## 2016-02-17 HISTORY — DX: Orthostatic hypotension: I95.1

## 2016-02-17 HISTORY — DX: Failed or difficult intubation, initial encounter: T88.4XXA

## 2016-02-17 LAB — URINALYSIS, ROUTINE W REFLEX MICROSCOPIC
Bilirubin Urine: NEGATIVE
Glucose, UA: NEGATIVE mg/dL
Hgb urine dipstick: NEGATIVE
Ketones, ur: NEGATIVE mg/dL
Leukocytes, UA: NEGATIVE
Nitrite: NEGATIVE
Protein, ur: NEGATIVE mg/dL
Specific Gravity, Urine: 1.005 (ref 1.005–1.030)
pH: 7 (ref 5.0–8.0)

## 2016-02-17 LAB — BASIC METABOLIC PANEL
Anion gap: 7 (ref 5–15)
BUN: 20 mg/dL (ref 6–20)
CO2: 28 mmol/L (ref 22–32)
Calcium: 9.4 mg/dL (ref 8.9–10.3)
Chloride: 103 mmol/L (ref 101–111)
Creatinine, Ser: 1.65 mg/dL — ABNORMAL HIGH (ref 0.61–1.24)
GFR calc Af Amer: 44 mL/min — ABNORMAL LOW (ref >60)
GFR calc non Af Amer: 38 mL/min — ABNORMAL LOW (ref >60)
Glucose, Bld: 121 mg/dL — ABNORMAL HIGH (ref 65–99)
Potassium: 4.7 mmol/L (ref 3.5–5.1)
Sodium: 138 mmol/L (ref 135–145)

## 2016-02-17 LAB — CBC
HCT: 38.1 % — ABNORMAL LOW (ref 39.0–52.0)
Hemoglobin: 12 g/dL — ABNORMAL LOW (ref 13.0–17.0)
MCH: 29.1 pg (ref 26.0–34.0)
MCHC: 31.5 g/dL (ref 30.0–36.0)
MCV: 92.5 fL (ref 78.0–100.0)
Platelets: 225 10*3/uL (ref 150–400)
RBC: 4.12 MIL/uL — ABNORMAL LOW (ref 4.22–5.81)
RDW: 14.6 % (ref 11.5–15.5)
WBC: 9.3 10*3/uL (ref 4.0–10.5)

## 2016-02-17 LAB — I-STAT TROPONIN, ED: Troponin i, poc: 0 ng/mL (ref 0.00–0.08)

## 2016-02-17 LAB — I-STAT CG4 LACTIC ACID, ED: Lactic Acid, Venous: 0.71 mmol/L (ref 0.5–1.9)

## 2016-02-17 LAB — OSMOLALITY: Osmolality: 299 mOsm/kg — ABNORMAL HIGH (ref 275–295)

## 2016-02-17 LAB — URIC ACID: Uric Acid, Serum: 3.1 mg/dL — ABNORMAL LOW (ref 4.4–7.6)

## 2016-02-17 LAB — CORTISOL-AM, BLOOD: Cortisol - AM: 3.7 ug/dL — ABNORMAL LOW (ref 6.7–22.6)

## 2016-02-17 MED ORDER — MORPHINE SULFATE (PF) 2 MG/ML IV SOLN: 2.0000 mg | INTRAVENOUS | Status: DC | PRN

## 2016-02-17 MED ORDER — SODIUM CHLORIDE 0.9 % IV BOLUS (SEPSIS)
1000.0000 mL | Freq: Once | INTRAVENOUS | Status: AC
  Administered 2016-02-17: 1000 mL via INTRAVENOUS

## 2016-02-17 MED ORDER — ACETAMINOPHEN 650 MG RE SUPP: 650.0000 mg | Freq: Four times a day (QID) | RECTAL | Status: DC | PRN

## 2016-02-17 MED ORDER — SODIUM CHLORIDE 0.9% FLUSH
3.0000 mL | Freq: Two times a day (BID) | INTRAVENOUS | Status: DC
  Administered 2016-02-17 – 2016-02-20 (×6): 3 mL via INTRAVENOUS

## 2016-02-17 MED ORDER — PANTOPRAZOLE SODIUM 40 MG PO TBEC
40.0000 mg | DELAYED_RELEASE_TABLET | Freq: Every day | ORAL | Status: DC
  Administered 2016-02-17 – 2016-02-20 (×4): 40 mg via ORAL
  Filled 2016-02-17 (×4): qty 1

## 2016-02-17 MED ORDER — POLYETHYLENE GLYCOL 3350 17 G PO PACK: 17.0000 g | PACK | Freq: Every day | ORAL | Status: DC | PRN

## 2016-02-17 MED ORDER — COSYNTROPIN 0.25 MG IJ SOLR
0.2500 mg | Freq: Once | INTRAMUSCULAR | Status: AC
  Administered 2016-02-18: 0.25 mg via INTRAVENOUS
  Filled 2016-02-17 (×2): qty 0.25

## 2016-02-17 MED ORDER — GABAPENTIN 300 MG PO CAPS
300.0000 mg | ORAL_CAPSULE | Freq: Three times a day (TID) | ORAL | Status: DC
  Administered 2016-02-17 – 2016-02-20 (×8): 300 mg via ORAL
  Filled 2016-02-17 (×8): qty 1

## 2016-02-17 MED ORDER — ATORVASTATIN CALCIUM 40 MG PO TABS
40.0000 mg | ORAL_TABLET | ORAL | Status: DC
  Administered 2016-02-18: 40 mg via ORAL
  Filled 2016-02-17: qty 1

## 2016-02-17 MED ORDER — DOCUSATE SODIUM 100 MG PO CAPS
100.0000 mg | ORAL_CAPSULE | Freq: Two times a day (BID) | ORAL | Status: DC
  Administered 2016-02-17 – 2016-02-20 (×6): 100 mg via ORAL
  Filled 2016-02-17 (×6): qty 1

## 2016-02-17 MED ORDER — ONDANSETRON HCL 4 MG/2ML IJ SOLN: 4.0000 mg | Freq: Four times a day (QID) | INTRAMUSCULAR | Status: DC | PRN

## 2016-02-17 MED ORDER — MAGNESIUM CITRATE PO SOLN: 1.0000 | Freq: Once | ORAL | Status: DC | PRN

## 2016-02-17 MED ORDER — ACETAMINOPHEN 325 MG PO TABS: 650.0000 mg | ORAL_TABLET | Freq: Four times a day (QID) | ORAL | Status: DC | PRN

## 2016-02-17 MED ORDER — ASPIRIN EC 81 MG PO TBEC
81.0000 mg | DELAYED_RELEASE_TABLET | Freq: Every day | ORAL | Status: DC
Start: 2016-02-18 — End: 2016-02-20
  Administered 2016-02-18 – 2016-02-20 (×3): 81 mg via ORAL
  Filled 2016-02-17 (×3): qty 1

## 2016-02-17 MED ORDER — MAGNESIUM OXIDE 400 (241.3 MG) MG PO TABS
400.0000 mg | ORAL_TABLET | Freq: Every day | ORAL | Status: DC
  Administered 2016-02-18 – 2016-02-20 (×3): 400 mg via ORAL
  Filled 2016-02-17 (×3): qty 1

## 2016-02-17 MED ORDER — AMITRIPTYLINE HCL 10 MG PO TABS
10.0000 mg | ORAL_TABLET | Freq: Three times a day (TID) | ORAL | Status: DC | PRN
  Administered 2016-02-18 (×3): 10 mg via ORAL
  Filled 2016-02-17 (×4): qty 1

## 2016-02-17 MED ORDER — ENOXAPARIN SODIUM 40 MG/0.4ML ~~LOC~~ SOLN
40.0000 mg | SUBCUTANEOUS | Status: DC
  Administered 2016-02-17 – 2016-02-19 (×3): 40 mg via SUBCUTANEOUS
  Filled 2016-02-17 (×3): qty 0.4

## 2016-02-17 MED ORDER — ONDANSETRON HCL 4 MG PO TABS: 4.0000 mg | ORAL_TABLET | Freq: Four times a day (QID) | ORAL | Status: DC | PRN

## 2016-02-17 NOTE — ED Provider Notes (Signed)
[name] DEPT Provider Note   CSN: [id number] Arrival date & time: 02/17/16  1249     History   Chief Complaint Chief Complaint  Patient presents with  . fall-hypotension    HPI [name] is a 81 y.o. male.  The history is provided by the patient and the spouse.  Weakness  Primary symptoms include no focal weakness, no loss of sensation, no speech change, no visual change. Primary symptoms comment: lightheadedness wtih any standing/walking. This is a recurrent problem. The current episode started more than 1 week ago. The problem has been gradually worsening. There was no focality noted. There has been no fever. Pertinent negatives include no shortness of breath, no chest pain, no vomiting, no altered mental status, no confusion and no headaches. There were no medications administered prior to arrival. Associated medical issues do not include a bleeding disorder or CVA.    Past Medical History:  Diagnosis Date  . 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 Colon polyps   . Coronary atherosclerosis of unspecified type of vessel, native or graft   . Diabetes mellitus    Diet control   . Difficult intubation   . Esophageal reflux   . Gastritis, chronic   . Hypertension   . IBS (irritable bowel syndrome)   . Orthostatic hypotension    "BP has been dropping alot when I stand up for the last month or so" (02/17/2016)  . Other and unspecified hyperlipidemia   . Personal history of other diseases of digestive system   . Vitamin B 12 deficiency   . Vitamin D deficiency     Patient Active Problem List   Diagnosis Date Noted  . 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 Medication management 08/11/2015  . Orthostatic hypotension 07/24/2015  . 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 NSTEMI (non-ST elevated myocardial infarction) ([place]) 07/01/2015  . BMI 25.0-25.9,adult 02/02/2015  . PVD (peripheral vascular disease) ([place]) 01/01/2014  . Hyperlipidemia 04/11/2013  . Prediabetes 04/11/2013  .  Hypertension   . Vitamin D deficiency   . Vitamin B 12 deficiency   . Coronary atherosclerosis- s/p PCI to LAD in 2009 and PCI to RCA in 2011 02/27/2009  . Abdominal aortic aneurysm ([place]) 02/27/2009  . GERD 02/27/2009  . History of IBS 02/27/2009    Past Surgical History:  Procedure Laterality Date  . BLADDER SURGERY  1969   traumatic pelvic fractures, urethral and bladder repair  . CARDIAC CATHETERIZATION N/A 07/01/2015   Procedure: Left Heart Cath and Coronary Angiography;  Surgeon: Wellington Hampshire, MD;  Location: [facility];  Service: Cardiovascular;  Laterality: N/A;  . CORONARY ANGIOPLASTY WITH STENT PLACEMENT    . CORONARY ARTERY BYPASS GRAFT N/A 07/06/2015   Procedure: CORONARY ARTERY BYPASS GRAFTING (CABG)x 4   utilizing the left internal mammary artery and endoscopically harvested bilateral  sapheneous vein.;  Surgeon: Ivin Poot, MD;  Location: [facility];  Service: Open Heart Surgery;  Laterality: N/A;  . KNEE SURGERY    . TEE WITHOUT CARDIOVERSION N/A 07/06/2015   Procedure: TRANSESOPHAGEAL ECHOCARDIOGRAM (TEE);  Surgeon: Ivin Poot, MD;  Location: [facility];  Service: Open Heart Surgery;  Laterality: N/A;       Home Medications    Prior to Admission medications   Medication Sig Start Date End Date Taking? Authorizing Provider  amitriptyline (ELAVIL) 10 MG tablet TAKE 1 TABLET BY MOUTH THREE TIMES DAILY AS NEEDED FOR NEURITIS PAIN 01/28/16  Yes [name], MD  aspirin  EC 81 MG EC tablet Take 1 tablet (81 mg total) by mouth daily. 07/11/15  Yes Wayne E Gold, PA-C  atorvastatin (LIPITOR) 80 MG tablet Take 1/2 to 1 tablet daily for Cholesterol or as directed Patient taking differently: Take 40 mg by mouth every evening. Cain Saupe, and Sat 10/30/15  Yes [name], MD  CALCIUM PO Take 1 tablet by mouth daily.   Yes Historical Provider, MD  Flaxseed, Linseed, 1000 MG CAPS Take 1,300 mg by mouth daily.    Yes Historical Provider, MD  fludrocortisone (FLORINEF)  0.1 MG tablet Take 0.1 mg by mouth 2 (two) times daily.   Yes Historical Provider, MD  gabapentin (NEURONTIN) 300 MG capsule Take 300 mg by mouth 3 (three) times daily.   Yes Historical Provider, MD  Magnesium 250 MG TABS Take 500 mg by mouth daily.    Yes Historical Provider, MD  multivitamin [facility]) per tablet Take 1 tablet by mouth daily.     Yes Historical Provider, MD  omeprazole (PRILOSEC) 20 MG capsule Take 20 mg by mouth at bedtime.    Yes Historical Provider, MD    Family History Family History  Problem Relation Age of Onset  . Heart attack Father     died age 57  . Heart attack Brother     died age 35  . Heart attack Sister     died age 13  . Colon cancer Sister   . Liver cancer Sister   . Diabetes Maternal Grandmother   . Colon polyps Sister     and brothers x 2     Social History Social History  Substance Use Topics  . Smoking status: Former Smoker    Quit date: 07/16/1963  . Smokeless tobacco: Never Used  . Alcohol use No     Allergies   Cymbalta [duloxetine hcl]; Keflex [cephalexin]; Simvastatin; and Sudafed [pseudoephedrine]   Review of Systems Review of Systems  Constitutional: Positive for fatigue. Negative for chills and fever.  HENT: Negative for congestion.   Respiratory: Negative for shortness of breath.   Cardiovascular: Negative for chest pain.  Gastrointestinal: Negative for vomiting.  Genitourinary: Negative for flank pain.  Musculoskeletal: Negative for back pain and neck pain.  Skin: Negative for rash.  Neurological: Positive for weakness and light-headedness. Negative for tremors, speech change, focal weakness, facial asymmetry, speech difficulty, numbness and headaches.  Hematological: Does not bruise/bleed easily.  Psychiatric/Behavioral: Negative for confusion.     Physical Exam Updated Vital Signs BP (!) 171/101 (BP Location: Left Arm)   Pulse 98   Temp 97.5 F (36.4 C) (Oral)   Resp 18   Ht 6' (1.829 m)   Wt 88.6 kg  Comment: scale c  SpO2 100%   BMI 26.50 kg/m   Physical Exam  Constitutional: He is oriented to person, place, and time. He appears well-developed and well-nourished. No distress.  Pleasant, cooperative, elderly but well-appearing while laying in bed. Appears lightheaded when standing  HENT:  Head: Normocephalic and atraumatic.  Eyes: Conjunctivae are normal. No scleral icterus.  Neck: Normal range of motion. Neck supple. No JVD present.  Cardiovascular: Normal rate, regular rhythm and intact distal pulses.   Pulmonary/Chest: Effort normal and breath sounds normal. No respiratory distress.  Abdominal: Soft. He exhibits no distension. There is no tenderness.  Musculoskeletal: He exhibits no edema or tenderness.  Symmetric size and appearance of b/l Le's, no calf swelling or tenderness  Neurological: He is alert and oriented to person, place, and time.  No cranial nerve deficit. He exhibits normal muscle tone. Coordination normal.  Symmetric 5/5 strength b/l Ue's and b/l Le's, normal speech, no dysmetria or ataxia  Skin: Skin is warm and dry. No rash noted. He is not diaphoretic. No pallor.  Psychiatric: He has a normal mood and affect.  Nursing note and vitals reviewed.    ED Treatments / Results  Labs (all labs ordered are listed, but only abnormal results are displayed) Labs Reviewed  BASIC METABOLIC PANEL - Abnormal; Notable for the following:       Result Value   Glucose, Bld 121 (*)    Creatinine, Ser 1.65 (*)    GFR calc non Af Amer 38 (*)    GFR calc Af Amer 44 (*)    All other components within normal limits  CBC - Abnormal; Notable for the following:    RBC 4.12 (*)    Hemoglobin 12.0 (*)    HCT 38.1 (*)    All other components within normal limits  OSMOLALITY - Abnormal; Notable for the following:    Osmolality 299 (*)    All other components within normal limits  URIC ACID - Abnormal; Notable for the following:    Uric Acid, Serum 3.1 (*)    All other components  within normal limits  CORTISOL-AM, BLOOD - Abnormal; Notable for the following:    Cortisol - AM 3.7 (*)    All other components within normal limits  URINE CULTURE  URINALYSIS, ROUTINE W REFLEX MICROSCOPIC (NOT AT [facility])  OSMOLALITY, URINE  NA AND K (SODIUM & POTASSIUM), RAND UR  BASIC METABOLIC PANEL  CBC  ACTH STIMULATION, 3 TIME POINTS  I-STAT CG4 LACTIC ACID, ED  I-STAT TROPOININ, ED    EKG  EKG Interpretation  Date/Time:  Wednesday February 17 2016 14:45:39 EDT Ventricular Rate:  87 PR Interval:    QRS Duration: 101 QT Interval:  391 QTC Calculation: 471 R Axis:   53 Text Interpretation:  Sinus rhythm Prolonged PR interval No significant change was found Confirmed by Wyvonnia Dusky  MD, STEPHEN [phone]) on 02/17/2016 2:55:49 PM       Radiology Dg Chest 2 View  Result Date: 02/17/2016 CLINICAL DATA:  Pt has been having an issue of low BP for a while now, seems worse last few days, every time he stands up his BP drops - fell yesterday, dizziness today - nonsmoker, had open heart surgery earlier this year, diabetes, htn EXAM: CHEST  2 VIEW COMPARISON:  02/04/2016 FINDINGS: Sternotomy wires overlie normal cardiac silhouette. Ectatic aorta. No effusion, infiltrate pneumothorax. Degenerative osteophytosis of the spine. IMPRESSION: No acute cardiopulmonary process. Electronically Signed   By: Suzy Bouchard M.D.   On: 02/17/2016 15:13   Ct Head Wo Contrast  Result Date: 02/17/2016 CLINICAL DATA:  Orthostatics hypotension with fall yesterday. EXAM: CT HEAD WITHOUT CONTRAST TECHNIQUE: Contiguous axial images were obtained from the base of the skull through the vertex without intravenous contrast. COMPARISON:  None. FINDINGS: Brain: The brain shows age related atrophy. No abnormality is seen affecting the brainstem or cerebellum. Within the cerebral hemispheres, there are chronic changes of small vessel disease throughout the white matter. There are old lacunar infarctions in the right  basal ganglia. No cortical or large vessel territory infarction. No mass lesion, hemorrhage, hydrocephalus or extra-axial collection. Vascular: There is atherosclerotic calcification of the major vessels at the base of the brain. Skull: No skull fracture or focal lesion. Sinuses/Orbits: Clear/normal Other: None significant IMPRESSION: No acute or traumatic finding.  Chronic small-vessel ischemic changes affecting the cerebral hemispheric white matter. 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 Atherosclerotic calcification of the major vessels at the base of the brain. Electronically Signed   By: Nelson Chimes M.D.   On: 02/17/2016 17:06    Procedures Procedures (including critical care time)  Medications Ordered in ED Medications  gabapentin (NEURONTIN) capsule 300 mg (300 mg Oral Given 02/17/16 2159)  amitriptyline (ELAVIL) tablet 10 mg (not administered)  atorvastatin (LIPITOR) tablet 40 mg (not administered)  pantoprazole (PROTONIX) EC tablet 40 mg (40 mg Oral Given 02/17/16 2159)  aspirin EC tablet 81 mg (not administered)  magnesium oxide (MAG-OX) tablet 400 mg (not administered)  enoxaparin (LOVENOX) injection 40 mg (40 mg Subcutaneous Given 02/17/16 2158)  acetaminophen (TYLENOL) tablet 650 mg (not administered)    Or  acetaminophen (TYLENOL) suppository 650 mg (not administered)  docusate sodium (COLACE) capsule 100 mg (100 mg Oral Given 02/17/16 2159)  ondansetron (ZOFRAN) tablet 4 mg (not administered)    Or  ondansetron (ZOFRAN) injection 4 mg (not administered)  sodium chloride flush (NS) 0.9 % injection 3 mL (3 mLs Intravenous Given 02/17/16 2200)  polyethylene glycol (MIRALAX / GLYCOLAX) packet 17 g (not administered)  magnesium citrate solution 1 Bottle (not administered)  morphine 2 MG/ML injection 2 mg (not administered)  cosyntropin (CORTROSYN) injection 0.25 mg (not administered)  sodium chloride 0.9 % bolus 1,000 mL (0 mLs Intravenous Stopped 02/17/16 1705)    sodium chloride 0.9 % bolus 1,000 mL (1,000 mLs Intravenous Transfusing/Transfer 02/17/16 1931)     Initial Impression / Assessment and Plan / ED Course  I have reviewed the triage vital signs and the nursing notes.  Pertinent labs & imaging results that were available during my care of the patient were reviewed by me and considered in my medical decision making (see chart for details).  Clinical Course   [name] is a 82 y.o. male who is s/p 4 vessel CABG 07/2015 with ongoing troubles with orthostatic hypotension since then for which he was tried on Florinef (discontinued for edema), and Midodrine without improvement, who presents to ED per recommendation from Dr. Idell Pickles office for ongoing orthostatic hypotension and fall yesterday. This is his second ER visit for same in two weeks, and has been so lightheaded/near-syncopal with ambulation that he has experienced two falls in the past couple of weeks as well. Has hit head, no headaches or numbness/weakness. Based on exam, doubt acute neurologic pathology. No chest pain or discomfort, no dyspnea. Per PCP, concerned for adrenal insufficiency. Labs demonstrate K+ and Na+ that are WNL, however, urine specific gravity low at 1.005 despite clinical volume depletion. As pt remains orthostatic after 2L NS and needs inpatient medication adjustments and additional orthostatic hypotension work-up, will admit for further management. Pt and wife agree with this plan.  Pt condition, course, and admission were discussed with attending physician Dr. Ezequiel Essex.  Final Clinical Impressions(s) / ED Diagnoses   Final diagnoses:  Orthostatic hypotension    New Prescriptions Current Discharge Medication List       Paralee Cancel, MD 02/17/16 [id number]    Ezequiel Essex, MD 02/18/16 [phone]

## 2016-02-17 NOTE — H&P (Signed)
History and Physical    [name] [id number] DOB: Apr 22, 1934 DOA: 02/17/2016  PCP: Alesia Richards, MD Consultants:  Stanford Breed - cardiology Patient coming from: home - lives with wife  Chief Complaint: orthostatic hypotension  HPI: [name] is a 82 y.o. male with medical history significant of DM, HTN, HLD, CAD s/p CAB presenting with persistent and worsening orthostatic hypotension.  He had heart surgery on March 6 - CABG x 4.  Unable to go to cardiac rehab because of dizziness and blood pressure dropping.  At times BP is so low that it doesn't measure on BP monitor.  Hypotension is worse in middle of the night and early morning.  Feeling woozy, not dizzy, no spinning.  [place] five times, twice in the last 2 weeks.  Once in the garden, twice at night getting up to go to the bathroom.  Several times legs have gotten shaky and legs have gone out underneath him.  Family feels like he has aged overnight.  Has been walking with cane or walker just to get his bearings after getting up.    Really unable to stand unsupported in the mornings; this subsides to an extent by lunchtime.  Before the surgery, he was also having some mild dizziness and postural hypotension.  It was much milder prior to the surgery.  No falls.  Symptoms started maybe 3 months prior to the surgery.  He does have some mid-thoracic spine tenderness and it radiates around to RUQ.  Has appointment for pain specialist Nov 21.  Pain has been present for a year or more, intensified after surgery.  Not associated with eating.  He saw Dr. Melford Aase this morning and was diagnosed with severe postural hypotension with concern for autonomic insufficency.  BP 85/55 standing.  P 106.  He reports that the patient was tried on Florinef with recovery of a standing BP, but developed dependent edema and supine HTN and so it was discontinued.  He was then started on Midodrine without improvement.  Dr. Melford Aase suggested ER visit  for labs to check for adrenal insufficiency.  Concern about Addison's - tested in office and told this wasn't the correct diagnosis - but patient was on Florinef at the time.  Steroids in ER helped transiently.     ED Course: Per Dr. Jimmye Norman: [name] is a 82 y.o. male who is s/p 4 vessel CABG 07/2015 with ongoing troubles with orthostatic hypotension since then for which he was tried on Florinef (discontinued for edema), and Midodrine without improvement, who presents to ED per recommendation from Dr. Idell Pickles office for ongoing orthostatic hypotension and fall yesterday. This is his second ER visit for same in two weeks, and has been so lightheaded/near-syncopal with ambulation that he has experienced two falls in the past couple of weeks as well. Has hit head, no headaches or numbness/weakness. Based on exam, doubt acute neurologic pathology. No chest pain or discomfort, no dyspnea. Per PCP, concerned for adrenal insufficiency. Labs demonstrate K+ and Na+ that are WNL, however, urine specific gravity low at 1.005 despite clinical volume depletion. As pt remains orthostatic after 2L NS and needs inpatient medication adjustments and additional orthostatic hypotension work-up, will admit for further management. Pt and wife agree with this plan.  Review of Systems: As per HPI; otherwise 10 point review of systems reviewed and negative.   Ambulatory Status:  As above  Past Medical History:  Diagnosis Date  . 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 Colon polyps   .  Coronary atherosclerosis of unspecified type of vessel, native or graft   . Diabetes mellitus    Diet control   . Difficult intubation   . Esophageal reflux   . Gastritis, chronic   . Hypertension   . IBS (irritable bowel syndrome)   . Orthostatic hypotension    "BP has been dropping alot when I stand up for the last month or so" (02/17/2016)  . Other and unspecified hyperlipidemia   . Personal history of other diseases of  digestive system   . Vitamin B 12 deficiency   . Vitamin D deficiency     Past Surgical History:  Procedure Laterality Date  . BLADDER SURGERY  1969   traumatic pelvic fractures, urethral and bladder repair  . CARDIAC CATHETERIZATION N/A 07/01/2015   Procedure: Left Heart Cath and Coronary Angiography;  Surgeon: Wellington Hampshire, MD;  Location: [facility];  Service: Cardiovascular;  Laterality: N/A;  . CORONARY ANGIOPLASTY WITH STENT PLACEMENT    . CORONARY ARTERY BYPASS GRAFT N/A 07/06/2015   Procedure: CORONARY ARTERY BYPASS GRAFTING (CABG)x 4   utilizing the left internal mammary artery and endoscopically harvested bilateral  sapheneous vein.;  Surgeon: Ivin Poot, MD;  Location: [facility];  Service: Open Heart Surgery;  Laterality: N/A;  . KNEE SURGERY    . TEE WITHOUT CARDIOVERSION N/A 07/06/2015   Procedure: TRANSESOPHAGEAL ECHOCARDIOGRAM (TEE);  Surgeon: Ivin Poot, MD;  Location: [facility];  Service: Open Heart Surgery;  Laterality: N/A;    Social History   Social History  . Marital status: Married    Spouse name: N/A  . Number of children: 4   . Years of education: N/A   Occupational History  . Retired    Social History Main Topics  . Smoking status: Former Smoker    Quit date: 07/16/1963  . Smokeless tobacco: Never Used  . Alcohol use No  . Drug use: No  . Sexual activity: Not Currently   Other Topics Concern  . Not on file   Social History Narrative   1 caffeine drink daily     Allergies  Allergen Reactions  . Cymbalta [Duloxetine Hcl]     Dizziness  . Keflex [Cephalexin] Other (See Comments)    Reaction: unknown   . Simvastatin Other (See Comments)    Reaction: unknown   . Sudafed [Pseudoephedrine]     Dizziness    Family History  Problem Relation Age of Onset  . Heart attack Father     died age 85  . Heart attack Brother     died age 79  . Heart attack Sister     died age 31  . Colon cancer Sister   . Liver cancer Sister   . Diabetes  Maternal Grandmother   . Colon polyps Sister     and brothers x 2     Prior to Admission medications   Medication Sig Start Date End Date Taking? Authorizing Provider  amitriptyline (ELAVIL) 10 MG tablet TAKE 1 TABLET BY MOUTH THREE TIMES DAILY AS NEEDED FOR NEURITIS PAIN 01/28/16  Yes [name], MD  aspirin EC 81 MG EC tablet Take 1 tablet (81 mg total) by mouth daily. 07/11/15  Yes Wayne E Gold, PA-C  atorvastatin (LIPITOR) 80 MG tablet Take 1/2 to 1 tablet daily for Cholesterol or as directed Patient taking differently: Take 40 mg by mouth every evening. Cain Saupe, and Sat 10/30/15  Yes [name], MD  CALCIUM PO Take 1 tablet by  mouth daily.   Yes Historical Provider, MD  Flaxseed, Linseed, 1000 MG CAPS Take 1,300 mg by mouth daily.    Yes Historical Provider, MD  fludrocortisone (FLORINEF) 0.1 MG tablet Take 0.1 mg by mouth 2 (two) times daily.   Yes Historical Provider, MD  gabapentin (NEURONTIN) 300 MG capsule Take 300 mg by mouth 3 (three) times daily.   Yes Historical Provider, MD  Magnesium 250 MG TABS Take 500 mg by mouth daily.    Yes Historical Provider, MD  multivitamin [facility]) per tablet Take 1 tablet by mouth daily.     Yes Historical Provider, MD  omeprazole (PRILOSEC) 20 MG capsule Take 20 mg by mouth at bedtime.    Yes Historical Provider, MD    Physical Exam: Vitals:   02/17/16 1953 02/17/16 2045 02/17/16 2057 02/17/16 2345  BP: 158/94 (!) 171/101  129/70  Pulse: 100 98  95  Resp: 16 18  18   Temp:  97.5 F (36.4 C)  97.8 F (36.6 C)  TempSrc:  Oral  Oral  SpO2: 96% 100%  96%  Weight:  89 kg (196 lb 4.8 oz) 88.6 kg (195 lb 6.4 oz)   Height:  6' (1.829 m)       General: Appears calm and comfortable and is NAD Eyes:  PERRL, EOMI, normal lids, iris ENT:  grossly normal hearing, lips & tongue, mmm Neck:  no LAD, masses or thyromegaly Cardiovascular:  RRR, no m/r/g. No LE edema.  Respiratory:  CTA bilaterally, no w/r/r. Normal respiratory  effort. Abdomen:  soft, ntnd, NABS Skin:  no rash or induration seen on limited exam Musculoskeletal:  grossly normal tone BUE/BLE, good ROM, no bony abnormality Psychiatric:  grossly normal mood and affect, speech fluent and appropriate, AOx3 Neurologic:  CN 2-12 grossly intact, moves all extremities in coordinated fashion, sensation intact  Labs on Admission: I have personally reviewed following labs and imaging studies  CBC:  Recent Labs Lab 02/17/16 1310  WBC 9.3  HGB 12.0*  HCT 38.1*  MCV 92.5  PLT 123456   Basic Metabolic Panel:  Recent Labs Lab 02/17/16 1310  NA 138  K 4.7  CL 103  CO2 28  GLUCOSE 121*  BUN 20  CREATININE 1.65*  CALCIUM 9.4   GFR: Estimated Creatinine Clearance: 39.2 mL/min (by C-G formula based on SCr of 1.65 mg/dL (H)). Liver Function Tests: No results for input(s): AST, ALT, ALKPHOS, BILITOT, PROT, ALBUMIN in the last 168 hours. No results for input(s): LIPASE, AMYLASE in the last 168 hours. No results for input(s): AMMONIA in the last 168 hours. Coagulation Profile: No results for input(s): INR, PROTIME in the last 168 hours. Cardiac Enzymes: No results for input(s): CKTOTAL, CKMB, CKMBINDEX, TROPONINI in the last 168 hours. BNP (last 3 results) No results for input(s): PROBNP in the last 8760 hours. HbA1C: No results for input(s): HGBA1C in the last 72 hours. CBG: No results for input(s): GLUCAP in the last 168 hours. Lipid Profile: No results for input(s): CHOL, HDL, LDLCALC, TRIG, CHOLHDL, LDLDIRECT in the last 72 hours. Thyroid Function Tests: No results for input(s): TSH, T4TOTAL, FREET4, T3FREE, THYROIDAB in the last 72 hours. Anemia Panel: No results for input(s): VITAMINB12, FOLATE, FERRITIN, TIBC, IRON, RETICCTPCT in the last 72 hours. Urine analysis:    Component Value Date/Time   COLORURINE YELLOW 02/17/2016 1502   APPEARANCEUR CLEAR 02/17/2016 1502   LABSPEC 1.005 02/17/2016 1502   PHURINE 7.0 02/17/2016 1502    GLUCOSEU NEGATIVE 02/17/2016 1502   HGBUR  NEGATIVE 02/17/2016 1502   BILIRUBINUR NEGATIVE 02/17/2016 1502   KETONESUR NEGATIVE 02/17/2016 1502   PROTEINUR NEGATIVE 02/17/2016 1502   UROBILINOGEN 1.0 01/05/2015 1550   NITRITE NEGATIVE 02/17/2016 1502   LEUKOCYTESUR NEGATIVE 02/17/2016 1502    Creatinine Clearance: Estimated Creatinine Clearance: 39.2 mL/min (by C-G formula based on SCr of 1.65 mg/dL (H)).  Sepsis Labs: @LABRCNTIP (procalcitonin:4,lacticidven:4) )No results found for this or any previous visit (from the past 240 hour(s)).   Radiological Exams on Admission: Dg Chest 2 View  Result Date: 02/17/2016 CLINICAL DATA:  Pt has been having an issue of low BP for a while now, seems worse last few days, every time he stands up his BP drops - fell yesterday, dizziness today - nonsmoker, had open heart surgery earlier this year, diabetes, htn EXAM: CHEST  2 VIEW COMPARISON:  02/04/2016 FINDINGS: Sternotomy wires overlie normal cardiac silhouette. Ectatic aorta. No effusion, infiltrate pneumothorax. Degenerative osteophytosis of the spine. IMPRESSION: No acute cardiopulmonary process. Electronically Signed   By: Suzy Bouchard M.D.   On: 02/17/2016 15:13   Ct Head Wo Contrast  Result Date: 02/17/2016 CLINICAL DATA:  Orthostatics hypotension with fall yesterday. EXAM: CT HEAD WITHOUT CONTRAST TECHNIQUE: Contiguous axial images were obtained from the base of the skull through the vertex without intravenous contrast. COMPARISON:  None. FINDINGS: Brain: The brain shows age related atrophy. No abnormality is seen affecting the brainstem or cerebellum. Within the cerebral hemispheres, there are chronic changes of small vessel disease throughout the white matter. There are old lacunar infarctions in the right basal ganglia. No cortical or large vessel territory infarction. No mass lesion, hemorrhage, hydrocephalus or extra-axial collection. Vascular: There is atherosclerotic calcification of the  major vessels at the base of the brain. Skull: No skull fracture or focal lesion. Sinuses/Orbits: Clear/normal Other: None significant IMPRESSION: No acute or traumatic finding. Chronic small-vessel ischemic changes affecting the cerebral hemispheric white matter. 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 Atherosclerotic calcification of the major vessels at the base of the brain. Electronically Signed   By: Nelson Chimes M.D.   On: 02/17/2016 17:06    EKG: Independently reviewed.  NSR with rate 87; prolonged PR interval with no evidence of acute ischemia  Assessment/Plan Principal Problem:   Orthostatic hypotension Active Problems:   Neuropathic pain of foot   Orthostatic hypotension -Appears to have severe symptoms -Creatinine today is 1.65 so there may be a mild AKI component, but prior recents have been 1.59 and 1.45 so there is not a significant difference here; also symptoms have been going on for months, so this is more likely a small factor if at all -Current most likely differential includes:  1. Severe and refractory orthostatic hypotension.  Patient has been tried on Florinef (improvement but developed dependent edema and supine HTN) and Midodrine (no improvement).  There is also droxidopa and secondary treatment options of atomoxetine and pyridostigmine for possible treatment options.  Ultimately, if he is thought to have idiopathic orthostatic hypotension, he may have to try each of these medications and decide which one he can tolerate and works the best.  2. Adrenal insufficiency.  Current cortisol is 3.7.  Unfortunately, while in the low range of normal, this was not the testing goal.  He is supposed to have a fasting cortisol level.  This has been ordered and hopefully will be done at 0500.  In addition to a fasting cortisol level, I have also ordered an ACTH stimulation test.  This should provide diagnostic confirmation about  whether or not this is the cause of his symptoms.   If so, he is likely to need lifelong glucocorticoid therapy in addition to mineralocorticoid (Florinef).  3. SIADH.  The patient has a mildly increased creatinine, indicating possible mild volume deficiency.  However, simultaneously his urine has a specific gravity of 1.005 (very dilute).  This begs the question of whether he has SIADH.  Urine and serum osmolality have been ordered, as have urinary electrolytes.  Uric acid level is 3.1, which may indicate mild volume expansion, consistent with SIADH.  His serum sodium is normal.  If thought to have SIADH, a CT C/A/P would be reasonable to look for occult malignancy.    4.  Sick sinus syndrome.  While orthostatic hypotension would not be a typical finding for this issue, the patient does have persistent tachycardia today but his wife reports episodic bradycardia.  If this is thought to be the issue, cardiology consultation would be useful.  Neuropathic pain -Will continue Elavil and Gabapentin.    DVT prophylaxis: Lovenox  Code Status:  Full - confirmed with patient/family Family Communication: Wife and daughter were present throughout evaluation Disposition Plan:  Home once clinically improved Consults called: None  Admission status: Admit - It is my clinical opinion that admission to INPATIENT is reasonable and necessary because this patient will require at least 2 midnights in the hospital to treat this condition based on the medical complexity of the problems presented.  Given the aforementioned information, the predictability of an adverse outcome is felt to be significant.     Karmen Bongo MD [facility]  If 7PM-7AM, please contact night-coverage [email or web address] Password [email or web address]  02/18/2016, 1:04 AM

## 2016-02-17 NOTE — ED Notes (Signed)
Returned from CT scan.

## 2016-02-17 NOTE — ED Notes (Signed)
Attempted report x1. Nurse sts she is in isolation at this moment

## 2016-02-17 NOTE — ED Triage Notes (Signed)
Patient here from Dr. Idell Pickles office for ongoing orthostatic hypotension and fall yesterday. Has been seen for this multiple times and states sent here for labs and fluids. Alert and oriented, denies pain.

## 2016-02-17 NOTE — ED Notes (Signed)
Patient has returned from being out of the department; Patient placed back on monitor, continuous pulse oximetry and blood pressure cuff; visitor at bedside

## 2016-02-17 NOTE — ED Notes (Signed)
Patient transported to CT 

## 2016-02-17 NOTE — Progress Notes (Signed)
Subjective:     Patient ID: [name], male   DOB: 02/10/1934, 82 y.o.   MRN: [id number]  HPI  This very nice, but unfortunate 82 yo MWM now s/p CAGB in Mar 2017.  Postop developed postural orthostasis and appropriately was started on Midodrine w/o significant improvement.  Then patient was tried on Florinef with recovery of a standing BP, but developed dependent edema and supine Hypertension & Florinef was d/c'd.   Since then patient has persisted with severe postural hypotension and has had several consequent falls and again last night on a trip to the bathroom, he fell backwards and abraded his posterior scalp. In addition , he's c/o tremor due to his weakness.   Medication Sig  . Acetaminophen  Take 2 tablets by mouth as needed.  Marland Kitchen amitriptyline 10 MG TAKE 1 TAB  x   DAILY AS NEEDED FOR NEURITIS PAIN  . aspirin EC 81 MG EC Take 1 tab daily.  Marland Kitchen atorvastatin 80 MG  Take 1/2 to 1 tablet daily for Cholesterol or as directed  . DULCOLAX  Take 1 capsule by mouth as needed.  Marland Kitchen CALCIUM  Take 1 tablet by mouth daily.  . Flaxseed 1000 MG Take 1,300 mg by mouth daily.   Marland Kitchen gabapentin  600 MG tab TK 1/2 TO 1 T PO TID PRF PAIN  . Magnesium 250 MG  Take 500 mg by mouth daily.   . multivitamin  Take 1 tablet by mouth daily.    Marland Kitchen omeprazole  20 MG cap Take 20 mg by mouth daily as needed.   Medication Dose Frequency Provider  . midodrine (PROAMATINE) tablet 2.5 mg  2.5 mg TID WC Erlene Quan, PA-C   Allergies  Allergen Reactions  . Cymbalta [Duloxetine Hcl]     Dizziness  . Keflex [Cephalexin] Other (See Comments)    Reaction: unknown   . Simvastatin Other (See Comments)    Reaction: unknown   . Sudafed [Pseudoephedrine]     Dizziness   Past Medical History:  Diagnosis Date  . 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 Colon polyps   . Coronary atherosclerosis of unspecified type of vessel, native or graft   . Diabetes mellitus    Diet control   . Esophageal reflux   . Gastritis,  chronic   . Hypertension   . IBS (irritable bowel syndrome)   . Other and unspecified hyperlipidemia   . Personal history of other diseases of digestive system   . Vitamin B 12 deficiency   . Vitamin D deficiency    Review of Systems  10 point systems review negative except as above.     Objective:   Physical Exam  BP 100/62 sitting and 80/50 standing  P 88   T 97.5 F   R 16   Ht 6' 0.5"    Wt 198 lb 9.6 oz    BMI 26.56   Superficial abrasion posterior vertex scalp.   HEENT - Eac's patent. TM's Nl. Neg Battle's sn. EOM's full. PERRLA. NasoOroPharynx clear. Neck - supple. Nl Thyroid. Carotids 1+ & No bruits, nodes, JVD Chest - Clear equal BS w/o Rales, rhonchi, wheezes. Cor - Nl HS. RRR w/o sig MGR. PP 1(+). No edema. MS- FROM w/o deformities. Muscle power, tone and bulk Nl. Gait very unstable. Neuro - No obvious Cr N abnormalities. (+) Tremor.  Sensory, motor and Cerebellar functions appear Nl w/o focal abnormalities.    Assessment:   1. Orthostatic hypotension  -  Patient is very high risk for fall   Plan:     Patient referred to ER for labs to evaluate for volume contraction and potential need for IVF. Patient and wife were advised that likely treatment with a mineralocorticoid to provide volume expansion to prevent postural hypotension would likely have consequences of dependent edema and supine/sitting Hypertension that will just have to be accepted.  Patient was assisted by wheel chair to his car (wife driving)  for transport to the ER.

## 2016-02-17 NOTE — ED Notes (Signed)
Patient transported to X-ray 

## 2016-02-18 DIAGNOSIS — G5790 Unspecified mononeuropathy of unspecified lower limb: Secondary | ICD-10-CM

## 2016-02-18 DIAGNOSIS — E785 Hyperlipidemia, unspecified: Secondary | ICD-10-CM

## 2016-02-18 DIAGNOSIS — I25810 Atherosclerosis of coronary artery bypass graft(s) without angina pectoris: Secondary | ICD-10-CM | POA: Diagnosis not present

## 2016-02-18 DIAGNOSIS — E119 Type 2 diabetes mellitus without complications: Secondary | ICD-10-CM

## 2016-02-18 DIAGNOSIS — N183 Chronic kidney disease, stage 3 (moderate): Secondary | ICD-10-CM | POA: Diagnosis not present

## 2016-02-18 DIAGNOSIS — I951 Orthostatic hypotension: Secondary | ICD-10-CM | POA: Diagnosis not present

## 2016-02-18 LAB — NA AND K (SODIUM & POTASSIUM), RAND UR
Potassium Urine: 35 mmol/L
Sodium, Ur: 130 mmol/L

## 2016-02-18 LAB — BASIC METABOLIC PANEL
Anion gap: 7 (ref 5–15)
BUN: 19 mg/dL (ref 6–20)
CO2: 28 mmol/L (ref 22–32)
Calcium: 8.9 mg/dL (ref 8.9–10.3)
Chloride: 105 mmol/L (ref 101–111)
Creatinine, Ser: 1.37 mg/dL — ABNORMAL HIGH (ref 0.61–1.24)
GFR calc Af Amer: 55 mL/min — ABNORMAL LOW (ref >60)
GFR calc non Af Amer: 47 mL/min — ABNORMAL LOW (ref >60)
Glucose, Bld: 86 mg/dL (ref 65–99)
Potassium: 4.1 mmol/L (ref 3.5–5.1)
Sodium: 140 mmol/L (ref 135–145)

## 2016-02-18 LAB — CBC
HCT: 33.5 % — ABNORMAL LOW (ref 39.0–52.0)
Hemoglobin: 10.9 g/dL — ABNORMAL LOW (ref 13.0–17.0)
MCH: 29.7 pg (ref 26.0–34.0)
MCHC: 32.5 g/dL (ref 30.0–36.0)
MCV: 91.3 fL (ref 78.0–100.0)
Platelets: 199 10*3/uL (ref 150–400)
RBC: 3.67 MIL/uL — ABNORMAL LOW (ref 4.22–5.81)
RDW: 14.6 % (ref 11.5–15.5)
WBC: 7.1 10*3/uL (ref 4.0–10.5)

## 2016-02-18 LAB — OSMOLALITY, URINE: Osmolality, Ur: 474 mOsm/kg (ref 300–900)

## 2016-02-18 LAB — URINE CULTURE: Culture: NO GROWTH

## 2016-02-18 LAB — ACTH STIMULATION, 3 TIME POINTS
Cortisol, 30 Min: 20.9 ug/dL
Cortisol, 60 Min: 23.6 ug/dL
Cortisol, Base: 7.3 ug/dL

## 2016-02-18 NOTE — Progress Notes (Signed)
Admitted pt from the ED per stretcher assisted by the NT. Alert oriented x 4 , denies chest pain, denies nausea and vomiting, not in respiratory distress. Placed on telemetry box 31. Oriented to call bell and room,advised on being high fall risk ,to call us for assistance and verbalized with understanding. Bed alarm on. Will continue to monitor pt   02/17/16 2045  Vitals  Temp 97.5 F (36.4 C)  Temp Source Oral  BP (!) 171/101  BP Location Left Arm  BP Method Automatic  Patient Position (if appropriate) Lying  Pulse Rate 98  Pulse Rate Source Dinamap  Resp 18  Oxygen Therapy  SpO2 100 %  O2 Device Room Air  Height and Weight  Height 6' (1.829 m)  Weight 89 kg (196 lb 4.8 oz) (bedscale due to b/p)  Type of Scale Used Bed  BSA (Calculated - sq m) 2.13 sq meters  BMI (Calculated) 26.7  Weight in (lb) to have BMI = 25 183.9

## 2016-02-18 NOTE — Progress Notes (Signed)
PROGRESS NOTE    [name]  [id number] DOB: 10-Aug-1933 DOA: 02/17/2016 PCP: Alesia Richards, MD   Chief Complaint  Patient presents with  . fall-hypotension    Brief Narrative:  HPI on 02/17/2016 by Dr. Karmen Bongo [name] is a 82 y.o. male with medical history significant of DM, HTN, HLD, CAD s/p CAB presenting with persistent and worsening orthostatic hypotension.  He had heart surgery on March 6 - CABG x 4.  Unable to go to cardiac rehab because of dizziness and blood pressure dropping.  At times BP is so low that it doesn't measure on BP monitor.  Hypotension is worse in middle of the night and early morning.  Feeling woozy, not dizzy, no spinning.  [place] five times, twice in the last 2 weeks.  Once in the garden, twice at night getting up to go to the bathroom.  Several times legs have gotten shaky and legs have gone out underneath him.  Family feels like he has aged overnight.  Has been walking with cane or walker just to get his bearings after getting up.    Really unable to stand unsupported in the mornings; this subsides to an extent by lunchtime.  Before the surgery, he was also having some mild dizziness and postural hypotension.  It was much milder prior to the surgery.  No falls.  Symptoms started maybe 3 months prior to the surgery.  He does have some mid-thoracic spine tenderness and it radiates around to RUQ.  Has appointment for pain specialist Nov 21.  Pain has been present for a year or more, intensified after surgery.  Not associated with eating.  He saw Dr. Melford Aase this morning and was diagnosed with severe postural hypotension with concern for autonomic insufficency.  BP 85/55 standing.  P 106.  He reports that the patient was tried on Florinef with recovery of a standing BP, but developed dependent edema and supine HTN and so it was discontinued.  He was then started on Midodrine without improvement.  Dr. Melford Aase suggested ER visit for labs  to check for adrenal insufficiency.  Concern about Addison's - tested in office and told this wasn't the correct diagnosis - but patient was on Florinef at the time.  Steroids in ER helped transiently.    Assessment & Plan   Orthostatic hypotension -ongoing issue -Patient has been on Florinef however developed edema and supine hypertension. Patient has also been on Midodrine with no improvement of hypotension. -Patient's primary care physician as well as cardiologist have been working with patient regarding this orthostatic hypotension. It was thought the patient may have had adrenal insufficiency, chronic cortisol level 3.7. Patient did have ACTH simulation test however at that time was taking Florinef. -Cardiology consulted and appreciated -Will discuss case by nephrology as well.  Coronary artery disease -Status post CABG in March 2017 -continue statin and aspirin -Currently not on beta blocker due to hypotension  Hyperlipidemia -Continue statin  Chronic kidney disease, stage II -Creatinine currently stable, continue to monitor BMP  Diabetes mellitus, type II -Currently diet controlled. Patient is on any medications at home  Constipation -Continue MiraLAX, Colace, and magnesium citrate as needed  GERD -Continue PPI  Neuropathic pain -Continue gabapentin and elavil  DVT Prophylaxis  Lovenox  Code Status: Full  Family Communication: Wife at bedside  Disposition Plan: In observation. Pending cardiology consult. ?nephrology consult  Consultants Cardiology  Procedures  None  Antibiotics   Anti-infectives    None  Subjective:   [name] seen and examined today.  Patient denies any chest pain, shortness of breath, abdominal pain, diarrhea. Does endorse constipation. Denies dizziness or headache. Denies any recent illness or travel.  Objective:   Vitals:   02/17/16 2057 02/17/16 2345 02/18/16 0525 02/18/16 0800  BP:  129/70 111/62 112/68  Pulse:   95 89 85  Resp:  18 18 18   Temp:  97.8 F (36.6 C) 98.3 F (36.8 C) 98.5 F (36.9 C)  TempSrc:  Oral Oral Oral  SpO2:  96% 94% 96%  Weight: 88.6 kg (195 lb 6.4 oz)  88.1 kg (194 lb 3.2 oz)   Height:        Intake/Output Summary (Last 24 hours) at 02/18/16 1127 Last data filed at 02/18/16 0956  Gross per 24 hour  Intake             1240 ml  Output             1645 ml  Net             -405 ml   Filed Weights   02/17/16 2045 02/17/16 2057 02/18/16 0525  Weight: 89 kg (196 lb 4.8 oz) 88.6 kg (195 lb 6.4 oz) 88.1 kg (194 lb 3.2 oz)    Exam  General: Well developed, well nourished, NAD, appears stated age  HEENT: NCAT, mucous membranes moist.   Neck: Supple, no JVD, no masses  Cardiovascular: S1 S2 auscultated, no rubs, murmurs or gallops. Regular rate and rhythm.  Respiratory: Clear to auscultation bilaterally with equal chest rise  Abdomen: Soft, nontender, nondistended, + bowel sounds  Extremities: warm dry without cyanosis clubbing or edema  Neuro: AAOx3, nonfocal  Skin: Without rashes exudates or nodules  Psych: Normal affect and demeanor with intact judgement and insight   Data Reviewed: I have personally reviewed following labs and imaging studies  CBC:  Recent Labs Lab 02/17/16 1310 02/18/16 0831  WBC 9.3 7.1  HGB 12.0* 10.9*  HCT 38.1* 33.5*  MCV 92.5 91.3  PLT 225 [id number]   Basic Metabolic Panel:  Recent Labs Lab 02/17/16 1310 02/18/16 0831  NA 138 140  K 4.7 4.1  CL 103 105  CO2 28 28  GLUCOSE 121* 86  BUN 20 19  CREATININE 1.65* 1.37*  CALCIUM 9.4 8.9   GFR: Estimated Creatinine Clearance: 47.2 mL/min (by C-G formula based on SCr of 1.37 mg/dL (H)). Liver Function Tests: No results for input(s): AST, ALT, ALKPHOS, BILITOT, PROT, ALBUMIN in the last 168 hours. No results for input(s): LIPASE, AMYLASE in the last 168 hours. No results for input(s): AMMONIA in the last 168 hours. Coagulation Profile: No results for input(s): INR,  PROTIME in the last 168 hours. Cardiac Enzymes: No results for input(s): CKTOTAL, CKMB, CKMBINDEX, TROPONINI in the last 168 hours. BNP (last 3 results) No results for input(s): PROBNP in the last 8760 hours. HbA1C: No results for input(s): HGBA1C in the last 72 hours. CBG: No results for input(s): GLUCAP in the last 168 hours. Lipid Profile: No results for input(s): CHOL, HDL, LDLCALC, TRIG, CHOLHDL, LDLDIRECT in the last 72 hours. Thyroid Function Tests: No results for input(s): TSH, T4TOTAL, FREET4, T3FREE, THYROIDAB in the last 72 hours. Anemia Panel: No results for input(s): VITAMINB12, FOLATE, FERRITIN, TIBC, IRON, RETICCTPCT in the last 72 hours. Urine analysis:    Component Value Date/Time   COLORURINE YELLOW 02/17/2016 1502   APPEARANCEUR CLEAR 02/17/2016 1502   LABSPEC 1.005 02/17/2016 1502   PHURINE  7.0 02/17/2016 1502   GLUCOSEU NEGATIVE 02/17/2016 1502   HGBUR NEGATIVE 02/17/2016 1502   BILIRUBINUR NEGATIVE 02/17/2016 1502   KETONESUR NEGATIVE 02/17/2016 1502   PROTEINUR NEGATIVE 02/17/2016 1502   UROBILINOGEN 1.0 01/05/2015 1550   NITRITE NEGATIVE 02/17/2016 1502   LEUKOCYTESUR NEGATIVE 02/17/2016 1502   Sepsis Labs: @LABRCNTIP (procalcitonin:4,lacticidven:4)  )No results found for this or any previous visit (from the past 240 hour(s)).    Radiology Studies: Dg Chest 2 View  Result Date: 02/17/2016 CLINICAL DATA:  Pt has been having an issue of low BP for a while now, seems worse last few days, every time he stands up his BP drops - fell yesterday, dizziness today - nonsmoker, had open heart surgery earlier this year, diabetes, htn EXAM: CHEST  2 VIEW COMPARISON:  02/04/2016 FINDINGS: Sternotomy wires overlie normal cardiac silhouette. Ectatic aorta. No effusion, infiltrate pneumothorax. Degenerative osteophytosis of the spine. IMPRESSION: No acute cardiopulmonary process. Electronically Signed   By: Suzy Bouchard M.D.   On: 02/17/2016 15:13   Ct Head Wo  Contrast  Result Date: 02/17/2016 CLINICAL DATA:  Orthostatics hypotension with fall yesterday. EXAM: CT HEAD WITHOUT CONTRAST TECHNIQUE: Contiguous axial images were obtained from the base of the skull through the vertex without intravenous contrast. COMPARISON:  None. FINDINGS: Brain: The brain shows age related atrophy. No abnormality is seen affecting the brainstem or cerebellum. Within the cerebral hemispheres, there are chronic changes of small vessel disease throughout the white matter. There are old lacunar infarctions in the right basal ganglia. No cortical or large vessel territory infarction. No mass lesion, hemorrhage, hydrocephalus or extra-axial collection. Vascular: There is atherosclerotic calcification of the major vessels at the base of the brain. Skull: No skull fracture or focal lesion. Sinuses/Orbits: Clear/normal Other: None significant IMPRESSION: No acute or traumatic finding. Chronic small-vessel ischemic changes affecting the cerebral hemispheric white matter. 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 Atherosclerotic calcification of the major vessels at the base of the brain. Electronically Signed   By: Nelson Chimes M.D.   On: 02/17/2016 17:06     Scheduled Meds: . aspirin EC  81 mg Oral Daily  . atorvastatin  40 mg Oral Q T,Th,Sat-1800  . docusate sodium  100 mg Oral BID  . enoxaparin (LOVENOX) injection  40 mg Subcutaneous Q24H  . gabapentin  300 mg Oral TID  . magnesium oxide  400 mg Oral Daily  . pantoprazole  40 mg Oral Daily  . sodium chloride flush  3 mL Intravenous Q12H   Continuous Infusions:    LOS: 0 days   Time Spent in minutes   30 minutes  Codie Hainer D.O. on 02/18/2016 at 11:27 AM  Between 7am to 7pm - Pager - [phone]  After 7pm go to [email or web address] - password [email or web address]  And look for the night coverage person covering for me after hours  [facility] Office  [phone]

## 2016-02-18 NOTE — Consult Note (Signed)
Cardiology Consult    Patient ID: VIRAAT SOUDER MRN: [id number], DOB/AGE: Jul 21, 1933   Admit date: 02/17/2016 Date of Consult: 02/18/2016  Primary Physician: Alesia Richards, MD Primary Cardiologist: Dr. Stanford Breed Requesting Provider: Dr. Ree Kida Reason for Consultation: Orthostatic Hypotension  Patient Profile    82 yo male with PMH of CAD s/p 4v CABG, HTN, HLD, orthostatic hypotension and borderline DM who presented c/o orthostatic hypotension and fall.   Past Medical History   Past Medical History:  Diagnosis Date  . 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 Colon polyps   . Coronary atherosclerosis of unspecified type of vessel, native or graft   . Diabetes mellitus    Diet control   . Difficult intubation   . Esophageal reflux   . Gastritis, chronic   . Hypertension   . IBS (irritable bowel syndrome)   . Orthostatic hypotension    "BP has been dropping alot when I stand up for the last month or so" (02/17/2016)  . Other and unspecified hyperlipidemia   . Personal history of other diseases of digestive system   . Vitamin B 12 deficiency   . Vitamin D deficiency     Past Surgical History:  Procedure Laterality Date  . BLADDER SURGERY  1969   traumatic pelvic fractures, urethral and bladder repair  . CARDIAC CATHETERIZATION N/A 07/01/2015   Procedure: Left Heart Cath and Coronary Angiography;  Surgeon: Wellington Hampshire, MD;  Location: [facility];  Service: Cardiovascular;  Laterality: N/A;  . CORONARY ANGIOPLASTY WITH STENT PLACEMENT    . CORONARY ARTERY BYPASS GRAFT N/A 07/06/2015   Procedure: CORONARY ARTERY BYPASS GRAFTING (CABG)x 4   utilizing the left internal mammary artery and endoscopically harvested bilateral  sapheneous vein.;  Surgeon: Ivin Poot, MD;  Location: [facility];  Service: Open Heart Surgery;  Laterality: N/A;  . KNEE SURGERY    . TEE WITHOUT CARDIOVERSION N/A 07/06/2015   Procedure: TRANSESOPHAGEAL ECHOCARDIOGRAM (TEE);  Surgeon: Ivin Poot, MD;  Location: [facility];  Service: Open Heart Surgery;  Laterality: N/A;     Allergies  Allergies  Allergen Reactions  . Cymbalta [Duloxetine Hcl]     Dizziness  . Keflex [Cephalexin] Other (See Comments)    Reaction: unknown   . Simvastatin Other (See Comments)    Reaction: unknown   . Sudafed [Pseudoephedrine]     Dizziness    History of Present Illness    Mr. Beach is an 82 yo male with PMH of CAD s/p 4v CABG (3/17), HTN, HLD, orthostatic hypotension and borderline DM followed by Dr. Stanford Breed. He was tried on midodrine with no effect. Unable to use Florinef because of fluid retention and hypertension. Also reported wearing compression stockings and attempting to increase his fluid intake. He was last seen in the office on 8/17 by Dr. Stanford Breed where he reported difficulty with orthostasis and dizziness with standing. On 02/11/16 he was started back on midodrine via phone encounter.    Echo in 3/17 showed EF of 60-65% with G1DD.  Reports since his CABG he has felt very well, but just struggles with his blood pressure. There was question about adrenal insufficieny at his last office visit. Appears he was tested on the office, but results were inaccurate because he had been Presented to the ED from PCP office on 10/18 with reports of continued orthostatic hypotension and 2 falls the past couple of weeks.   In the ED labs showed stable electrolytes, but urine specific  gravity was 1.005. He was given a total of 2L NS with some improvement in blood pressure. Cr was noted at 1.65 on admission. EKG showed SR with 1st degree AVB. He was admitted for further evaluation.   Inpatient Medications    . aspirin EC  81 mg Oral Daily  . atorvastatin  40 mg Oral Q T,Th,Sat-1800  . docusate sodium  100 mg Oral BID  . enoxaparin (LOVENOX) injection  40 mg Subcutaneous Q24H  . gabapentin  300 mg Oral TID  . magnesium oxide  400 mg Oral Daily  . pantoprazole  40 mg Oral Daily  . sodium  chloride flush  3 mL Intravenous Q12H    Family History    Family History  Problem Relation Age of Onset  . Heart attack Father     died age 67  . Heart attack Brother     died age 80  . Heart attack Sister     died age 75  . Colon cancer Sister   . Liver cancer Sister   . Diabetes Maternal Grandmother   . Colon polyps Sister     and brothers x 2     Social History    Social History   Social History  . Marital status: Married    Spouse name: N/A  . Number of children: 4   . Years of education: N/A   Occupational History  . Retired    Social History Main Topics  . Smoking status: Former Smoker    Quit date: 07/16/1963  . Smokeless tobacco: Never Used  . Alcohol use No  . Drug use: No  . Sexual activity: Not Currently   Other Topics Concern  . Not on file   Social History Narrative   1 caffeine drink daily      Review of Systems    General:  No chills, fever, night sweats or weight changes.  Cardiovascular:  No chest pain, dyspnea on exertion, edema, orthopnea, palpitations, paroxysmal nocturnal dyspnea. Dermatological: No rash, lesions/masses Respiratory: No cough, dyspnea Urologic: No hematuria, dysuria Abdominal:   No nausea, vomiting, diarrhea, bright red blood per rectum, melena, or hematemesis Neurologic:  No visual changes, ++ wkns, changes in mental status, ++dizziness. All other systems reviewed and are otherwise negative except as noted above.  Physical Exam    Blood pressure 121/69, pulse 96, temperature 98.8 F (37.1 C), temperature source Oral, resp. rate 18, height 6' (1.829 m), weight 194 lb 3.2 oz (88.1 kg), SpO2 94 %.  General: Pleasant older WM, NAD Psych: Normal affect. Neuro: Alert and oriented X 3. Moves all extremities spontaneously. HEENT: Normal  Neck: Supple without bruits or JVD. Lungs:  Resp regular and unlabored, CTA. Heart: RRR no s3, s4, or murmurs. Abdomen: Soft, non-tender, non-distended, BS + x 4.  Extremities: No  clubbing, cyanosis or edema. DP/PT/Radials 2+ and equal bilaterally.  Labs    Troponin [facility] of Care Test)  Recent Labs  02/17/16 1444  TROPIPOC 0.00   No results for input(s): CKTOTAL, CKMB, TROPONINI in the last 72 hours. Lab Results  Component Value Date   WBC 7.1 02/18/2016   HGB 10.9 (L) 02/18/2016   HCT 33.5 (L) 02/18/2016   MCV 91.3 02/18/2016   PLT 199 02/18/2016    Recent Labs Lab 02/18/16 0831  NA 140  K 4.1  CL 105  CO2 28  BUN 19  CREATININE 1.37*  CALCIUM 8.9  GLUCOSE 86   Lab Results  Component Value Date  CHOL 171 10/29/2015   HDL 31 (L) 10/29/2015   LDLCALC 116 10/29/2015   TRIG 118 10/29/2015   No results found for: [facility]   Radiology Studies    Dg Chest 2 View  Result Date: 02/17/2016 CLINICAL DATA:  Pt has been having an issue of low BP for a while now, seems worse last few days, every time he stands up his BP drops - fell yesterday, dizziness today - nonsmoker, had open heart surgery earlier this year, diabetes, htn EXAM: CHEST  2 VIEW COMPARISON:  02/04/2016 FINDINGS: Sternotomy wires overlie normal cardiac silhouette. Ectatic aorta. No effusion, infiltrate pneumothorax. Degenerative osteophytosis of the spine. IMPRESSION: No acute cardiopulmonary process. Electronically Signed   By: Suzy Bouchard M.D.   On: 02/17/2016 15:13   Dg Chest 2 View  Result Date: 02/04/2016 CLINICAL DATA:  Patient with drop in blood pressure.  Weakness. EXAM: CHEST  2 VIEW COMPARISON:  Chest radiograph 08/05/2015. FINDINGS: Patient is rotated to the right. Normal cardiac and mediastinal contours. Tortuosity the thoracic aorta. No consolidative pulmonary opacities. No pleural effusion or pneumothorax. Mid thoracic spine degenerative changes. IMPRESSION: No acute cardiopulmonary process. Electronically Signed   By: Lovey Newcomer M.D.   On: 02/04/2016 11:29   Ct Head Wo Contrast  Result Date: 02/17/2016 CLINICAL DATA:  Orthostatics hypotension with fall yesterday.  EXAM: CT HEAD WITHOUT CONTRAST TECHNIQUE: Contiguous axial images were obtained from the base of the skull through the vertex without intravenous contrast. COMPARISON:  None. FINDINGS: Brain: The brain shows age related atrophy. No abnormality is seen affecting the brainstem or cerebellum. Within the cerebral hemispheres, there are chronic changes of small vessel disease throughout the white matter. There are old lacunar infarctions in the right basal ganglia. No cortical or large vessel territory infarction. No mass lesion, hemorrhage, hydrocephalus or extra-axial collection. Vascular: There is atherosclerotic calcification of the major vessels at the base of the brain. Skull: No skull fracture or focal lesion. Sinuses/Orbits: Clear/normal Other: None significant IMPRESSION: No acute or traumatic finding. Chronic small-vessel ischemic changes affecting the cerebral hemispheric white matter. 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 Atherosclerotic calcification of the major vessels at the base of the brain. Electronically Signed   By: Nelson Chimes M.D.   On: 02/17/2016 17:06    ECG & Cardiac Imaging    EKG: SR 1st degree AVB  Echo: 07/01/15  Study Conclusions  - Left ventricle: The cavity size was normal. There was mild   concentric hypertrophy. Systolic function was normal. The   estimated ejection fraction was in the range of 60% to 65%. Wall   motion was normal; there were no regional wall motion   abnormalities. Doppler parameters are consistent with abnormal   left ventricular relaxation (grade 1 diastolic dysfunction). - Aortic valve: Transvalvular velocity was within the normal range.   There was no stenosis. There was no regurgitation. - Mitral valve: Transvalvular velocity was within the normal range.   There was no evidence for stenosis. There was no regurgitation. - Left atrium: The atrium was mildly dilated. - Right ventricle: The cavity size was normal. Wall thickness was    normal. Systolic function was normal. - Atrial septum: No defect or patent foramen ovale was identified   by color flow Doppler. - Tricuspid valve: There was mild regurgitation. - Inferior vena cava: The vessel was normal in size.  Assessment & Plan    82 yo male with PMH of CAD s/p 4v CABG, HTN, HLD, orthostatic hypotension and  borderline DM who presented c/o orthostatic hypotension and fall.   Orthostatic Hypotension: Has been an on-going issue since having his CABG earlier this year. Tried midodrine with no improvement and Florinef caused HTN and edema. Also has tried compression stockings. Has also been experiencing falls in the past 2 weeks. Thought he may have adrenal insufficieny, though previous testing results were inaccurate. Cortisol was 3.7 on admission, but he had not been fasting. ACTH done this am which appears normal.  -- Discussed the use of Florinef with pharmacy as it appears he has been tried on the lowest dose possible in the past. Could try 0.05mg  dose, but unsure if this would even be effective. Would be hesitant to resume this. His Tenormin was stopped earlier in the year.  -- Encouraged him to continue with adequate fluid intake. May benefit from nephrology input?   Barnet Pall, NP-C Pager [phone] 02/18/2016, 1:16 PM   I have examined the patient and reviewed assessment and plan and discussed with patient.  Agree with above as stated.  Stay well hydrated.  Could try Fluorinef .1 mg QOD as well.  This medicine worked for him in the past but worked too well due to fluid retention and increased BP.   Larae Grooms

## 2016-02-19 ENCOUNTER — Other Ambulatory Visit: Payer: Self-pay | Admitting: Internal Medicine

## 2016-02-19 DIAGNOSIS — N183 Chronic kidney disease, stage 3 unspecified: Secondary | ICD-10-CM

## 2016-02-19 DIAGNOSIS — E119 Type 2 diabetes mellitus without complications: Secondary | ICD-10-CM | POA: Diagnosis not present

## 2016-02-19 DIAGNOSIS — E1122 Type 2 diabetes mellitus with diabetic chronic kidney disease: Secondary | ICD-10-CM

## 2016-02-19 DIAGNOSIS — I951 Orthostatic hypotension: Secondary | ICD-10-CM | POA: Diagnosis not present

## 2016-02-19 DIAGNOSIS — G5790 Unspecified mononeuropathy of unspecified lower limb: Secondary | ICD-10-CM | POA: Diagnosis not present

## 2016-02-19 DIAGNOSIS — Z951 Presence of aortocoronary bypass graft: Secondary | ICD-10-CM

## 2016-02-19 DIAGNOSIS — I25810 Atherosclerosis of coronary artery bypass graft(s) without angina pectoris: Secondary | ICD-10-CM | POA: Diagnosis not present

## 2016-02-19 LAB — CBC
HCT: 33.5 % — ABNORMAL LOW (ref 39.0–52.0)
Hemoglobin: 10.6 g/dL — ABNORMAL LOW (ref 13.0–17.0)
MCH: 29.1 pg (ref 26.0–34.0)
MCHC: 31.6 g/dL (ref 30.0–36.0)
MCV: 92 fL (ref 78.0–100.0)
Platelets: 191 10*3/uL (ref 150–400)
RBC: 3.64 MIL/uL — ABNORMAL LOW (ref 4.22–5.81)
RDW: 14.5 % (ref 11.5–15.5)
WBC: 7.1 10*3/uL (ref 4.0–10.5)

## 2016-02-19 LAB — BASIC METABOLIC PANEL
Anion gap: 7 (ref 5–15)
BUN: 21 mg/dL — ABNORMAL HIGH (ref 6–20)
CO2: 28 mmol/L (ref 22–32)
Calcium: 8.5 mg/dL — ABNORMAL LOW (ref 8.9–10.3)
Chloride: 102 mmol/L (ref 101–111)
Creatinine, Ser: 1.65 mg/dL — ABNORMAL HIGH (ref 0.61–1.24)
GFR calc Af Amer: 44 mL/min — ABNORMAL LOW (ref >60)
GFR calc non Af Amer: 38 mL/min — ABNORMAL LOW (ref >60)
Glucose, Bld: 140 mg/dL — ABNORMAL HIGH (ref 65–99)
Potassium: 3.8 mmol/L (ref 3.5–5.1)
Sodium: 137 mmol/L (ref 135–145)

## 2016-02-19 MED ORDER — FLUDROCORTISONE ACETATE 0.1 MG PO TABS
0.1000 mg | ORAL_TABLET | Freq: Every day | ORAL | Status: DC
  Administered 2016-02-19 – 2016-02-20 (×2): 0.1 mg via ORAL
  Filled 2016-02-19 (×2): qty 1

## 2016-02-19 NOTE — Progress Notes (Signed)
Abdominal binder applied.

## 2016-02-19 NOTE — Progress Notes (Signed)
Patient Name: [name] Date of Encounter: 02/19/2016  Primary Cardiologist:[facility]  Hospital Problem List     Principal Problem:   Orthostatic hypotension Active Problems:   S/P CABG x 4   Neuropathic pain of foot   Coronary artery disease involving coronary bypass graft of native heart without angina pectoris   Diabetes mellitus type 2, diet-controlled (HCC)   CKD (chronic kidney disease), stage III    Subjective   Feeling better this AM but hasn't really been out of bed yet.  Inpatient Medications    . aspirin EC  81 mg Oral Daily  . atorvastatin  40 mg Oral Q T,Th,Sat-1800  . docusate sodium  100 mg Oral BID  . enoxaparin (LOVENOX) injection  40 mg Subcutaneous Q24H  . fludrocortisone  0.1 mg Oral Daily  . gabapentin  300 mg Oral TID  . magnesium oxide  400 mg Oral Daily  . pantoprazole  40 mg Oral Daily  . sodium chloride flush  3 mL Intravenous Q12H    Vital Signs    Vitals:   02/18/16 2012 02/18/16 2336 02/19/16 0552 02/19/16 0913  BP: (!) 92/52 118/65 110/64 (!) 97/59  Pulse: 65 89 87 85  Resp:  16 17   Temp: 98.8 F (37.1 C) 98.4 F (36.9 C) 98 F (36.7 C) 97.7 F (36.5 C)  TempSrc: Oral Oral Oral Oral  SpO2: 94% 95% 95% 95%  Weight:   191 lb 14.4 oz (87 kg)   Height:        Intake/Output Summary (Last 24 hours) at 02/19/16 1138 Last data filed at 02/19/16 1100  Gross per 24 hour  Intake             1463 ml  Output             1800 ml  Net             -337 ml   Filed Weights   02/17/16 2057 02/18/16 0525 02/19/16 0552  Weight: 195 lb 6.4 oz (88.6 kg) 194 lb 3.2 oz (88.1 kg) 191 lb 14.4 oz (87 kg)    Physical Exam    General: Well developed, well nourished WM in no acute distress. HEENT: Normocephalic, atraumatic, sclera non-icteric, no xanthomas, nares are without discharge. Neck: Negative for carotid bruits. JVP not elevated. Lungs: Clear bilaterally to auscultation without wheezes, rales, or rhonchi. Breathing is  unlabored. Cardiac: RRR S1 S2 without murmurs, rubs, or gallops.  Abdomen: Soft, non-tender, non-distended with normoactive bowel sounds. No rebound/guarding. Extremities: No clubbing or cyanosis. No edema. Distal pedal pulses are 2+ and equal bilaterally. Skin: Warm and dry, no significant rash. Neuro: Alert and oriented X 3. Strength and sensation in tact. Psych:  Responds to questions appropriately with a normal affect.  Labs    CBC  Recent Labs  02/18/16 0831 02/19/16 0838  WBC 7.1 7.1  HGB 10.9* 10.6*  HCT 33.5* 33.5*  MCV 91.3 92.0  PLT 199 [id number]   Basic Metabolic Panel  Recent Labs  02/18/16 0831 02/19/16 0838  NA 140 137  K 4.1 3.8  CL 105 102  CO2 28 28  GLUCOSE 86 140*  BUN 19 21*  CREATININE 1.37* 1.65*  CALCIUM 8.9 8.5*     Telemetry    NSR  Radiology    Dg Chest 2 View  Result Date: 02/17/2016 CLINICAL DATA:  Pt has been having an issue of low BP for a while now, seems worse last few days, every  time he stands up his BP drops - fell yesterday, dizziness today - nonsmoker, had open heart surgery earlier this year, diabetes, htn EXAM: CHEST  2 VIEW COMPARISON:  02/04/2016 FINDINGS: Sternotomy wires overlie normal cardiac silhouette. Ectatic aorta. No effusion, infiltrate pneumothorax. Degenerative osteophytosis of the spine. IMPRESSION: No acute cardiopulmonary process. Electronically Signed   By: Suzy Bouchard M.D.   On: 02/17/2016 15:13   Dg Chest 2 View  Result Date: 02/04/2016 CLINICAL DATA:  Patient with drop in blood pressure.  Weakness. EXAM: CHEST  2 VIEW COMPARISON:  Chest radiograph 08/05/2015. FINDINGS: Patient is rotated to the right. Normal cardiac and mediastinal contours. Tortuosity the thoracic aorta. No consolidative pulmonary opacities. No pleural effusion or pneumothorax. Mid thoracic spine degenerative changes. IMPRESSION: No acute cardiopulmonary process. Electronically Signed   By: Lovey Newcomer M.D.   On: 02/04/2016 11:29   Ct  Head Wo Contrast  Result Date: 02/17/2016 CLINICAL DATA:  Orthostatics hypotension with fall yesterday. EXAM: CT HEAD WITHOUT CONTRAST TECHNIQUE: Contiguous axial images were obtained from the base of the skull through the vertex without intravenous contrast. COMPARISON:  None. FINDINGS: Brain: The brain shows age related atrophy. No abnormality is seen affecting the brainstem or cerebellum. Within the cerebral hemispheres, there are chronic changes of small vessel disease throughout the white matter. There are old lacunar infarctions in the right basal ganglia. No cortical or large vessel territory infarction. No mass lesion, hemorrhage, hydrocephalus or extra-axial collection. Vascular: There is atherosclerotic calcification of the major vessels at the base of the brain. Skull: No skull fracture or focal lesion. Sinuses/Orbits: Clear/normal Other: None significant IMPRESSION: No acute or traumatic finding. Chronic small-vessel ischemic changes affecting the cerebral hemispheric white matter. 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 Atherosclerotic calcification of the major vessels at the base of the brain. Electronically Signed   By: Nelson Chimes M.D.   On: 02/17/2016 17:06     Patient Profile     82 yo male with PMH of CAD (prior PCI to LAD and RCA; s/p 4v CABG 07/2015, EF 60-65% with gr1DD by echo 07/2015), HTN, HLD, gastritis, CKD stage III, orthostatic hypotension, borderline DM, remote stroke by this admission who presented c/o orthostatic hypotension and fall.   Assessment & Plan    1. Orthostatic hypotension - he was previously on midodrine 2.5mg  TID per wife without improvement. Used to be on florinef but this was stopped due to supine HTN and edema. The decision has been made to re-challenge with Florinef. Will check orthostatics this AM. Also reviewed need for abdominal binder. May need to consider empiric trial of Northera if he doesn't tolerate Florinef. ACTH stim test felt to be  normal by IM.  2. CAD - no evidence of angina. Continue ASA, stain.  3. CKD stage III - per IM.  Signed, Charlie Pitter, PA-C  02/19/2016, 11:38 AM   I have examined the patient and reviewed assessment and plan and discussed with patient.  Agree with above as stated.  Agree with fluorinef and abdominal binder to help with orthostatic sx.  Swelling he had in the past was not bothersome.  Watch for swelling or hypertension like he had in the past.  Consider decreasing frequency of fluorinef to every other day is he has BPs that are too high.   Larae Grooms

## 2016-02-19 NOTE — Progress Notes (Addendum)
PROGRESS NOTE    [name]  [id number] DOB: 07-Jan-1934 DOA: 02/17/2016 PCP: Alesia Richards, MD   Chief Complaint  Patient presents with  . fall-hypotension    Brief Narrative:  HPI on 02/17/2016 by Dr. Karmen Bongo [name] is a 82 y.o. male with medical history significant of DM, HTN, HLD, CAD s/p CAB presenting with persistent and worsening orthostatic hypotension.  He had heart surgery on March 6 - CABG x 4.  Unable to go to cardiac rehab because of dizziness and blood pressure dropping.  At times BP is so low that it doesn't measure on BP monitor.  Hypotension is worse in middle of the night and early morning.  Feeling woozy, not dizzy, no spinning.  [place] five times, twice in the last 2 weeks.  Once in the garden, twice at night getting up to go to the bathroom.  Several times legs have gotten shaky and legs have gone out underneath him.  Family feels like he has aged overnight.  Has been walking with cane or walker just to get his bearings after getting up.    Really unable to stand unsupported in the mornings; this subsides to an extent by lunchtime.  Before the surgery, he was also having some mild dizziness and postural hypotension.  It was much milder prior to the surgery.  No falls.  Symptoms started maybe 3 months prior to the surgery.  He does have some mid-thoracic spine tenderness and it radiates around to RUQ.  Has appointment for pain specialist Nov 21.  Pain has been present for a year or more, intensified after surgery.  Not associated with eating.  He saw Dr. Melford Aase this morning and was diagnosed with severe postural hypotension with concern for autonomic insufficency.  BP 85/55 standing.  P 106.  He reports that the patient was tried on Florinef with recovery of a standing BP, but developed dependent edema and supine HTN and so it was discontinued.  He was then started on Midodrine without improvement.  Dr. Melford Aase suggested ER visit for labs  to check for adrenal insufficiency.  Concern about Addison's - tested in office and told this wasn't the correct diagnosis - but patient was on Florinef at the time.  Steroids in ER helped transiently.    Assessment & Plan   Orthostatic hypotension -ongoing issue -Patient has been on Florinef however developed edema and supine hypertension. Patient has also been on Midodrine with no improvement of hypotension. -Patient's primary care physician as well as cardiologist have been working with patient regarding this orthostatic hypotension. It was thought the patient may have had adrenal insufficiency, chronic cortisol level 3.7. Patient did have ACTH simulation test however at that time was taking Florinef. -Patient's AM cortisol 3.7, ACTH stimulation testing normal  -Cardiology consulted and appreciated -Spoke with nephrology, Dr. Justin Mend.  Recommended flornief 0.1mg  daily and abdominal binder. Not much else to offer at this point. -RN attempted to get patient OOB yesterday, SBP dropped into the 50s. -Discussed fluid intake with patient and wife at bedside.  Advised patient to increase his fluid intake and well as electrolyte replacement (not purely water).   Coronary artery disease -Status post CABG in March 2017 -continue statin and aspirin -Currently not on beta blocker due to hypotension  Hyperlipidemia -Continue statin  Chronic kidney disease, stage II -Creatinine currently stable, continue to monitor BMP  Diabetes mellitus, type II -Currently diet controlled. Patient is on any medications at home  Constipation -Continue MiraLAX, Colace,  and magnesium citrate as needed  GERD -Continue PPI  Neuropathic pain -Continue gabapentin and elavil  DVT Prophylaxis  Lovenox  Code Status: Full  Family Communication: Wife at bedside  Disposition Plan: In observation. Possible discharge in 24-48 hours.    Consultants Cardiology Nephrology, via phone  Procedures   None  Antibiotics   Anti-infectives    None      Subjective:   [name] seen and examined today.  Patient denies any chest pain, shortness of breath, abdominal pain, diarrhea, headache, or dizziness.  States he will give Florinef another try.  Objective:   Vitals:   02/18/16 2012 02/18/16 2336 02/19/16 0552 02/19/16 0913  BP: (!) 92/52 118/65 110/64 (!) 97/59  Pulse: 65 89 87 85  Resp:  16 17   Temp: 98.8 F (37.1 C) 98.4 F (36.9 C) 98 F (36.7 C) 97.7 F (36.5 C)  TempSrc: Oral Oral Oral Oral  SpO2: 94% 95% 95% 95%  Weight:   87 kg (191 lb 14.4 oz)   Height:        Intake/Output Summary (Last 24 hours) at 02/19/16 1141 Last data filed at 02/19/16 1100  Gross per 24 hour  Intake             1463 ml  Output             1800 ml  Net             -337 ml   Filed Weights   02/17/16 2057 02/18/16 0525 02/19/16 0552  Weight: 88.6 kg (195 lb 6.4 oz) 88.1 kg (194 lb 3.2 oz) 87 kg (191 lb 14.4 oz)    Exam  General: Well developed, well nourished, NAD, appears stated age  HEENT: NCAT, mucous membranes moist.   Cardiovascular: S1 S2 auscultated, RRR, no murmurs  Respiratory: Clear to auscultation bilaterally with equal chest rise  Abdomen: Soft, nontender, nondistended, + bowel sounds  Extremities: warm dry without cyanosis clubbing or edema  Neuro: AAOx3, nonfocal  Skin: Without rashes exudates or nodules  Psych: Normal affect and demeanor with intact judgement and insight, pleasant   Data Reviewed: I have personally reviewed following labs and imaging studies  CBC:  Recent Labs Lab 02/17/16 1310 02/18/16 0831 02/19/16 0838  WBC 9.3 7.1 7.1  HGB 12.0* 10.9* 10.6*  HCT 38.1* 33.5* 33.5*  MCV 92.5 91.3 92.0  PLT 225 199 [id number]   Basic Metabolic Panel:  Recent Labs Lab 02/17/16 1310 02/18/16 0831 02/19/16 0838  NA 138 140 137  K 4.7 4.1 3.8  CL 103 105 102  CO2 28 28 28   GLUCOSE 121* 86 140*  BUN 20 19 21*  CREATININE 1.65* 1.37*  1.65*  CALCIUM 9.4 8.9 8.5*   GFR: Estimated Creatinine Clearance: 39.2 mL/min (by C-G formula based on SCr of 1.65 mg/dL (H)). Liver Function Tests: No results for input(s): AST, ALT, ALKPHOS, BILITOT, PROT, ALBUMIN in the last 168 hours. No results for input(s): LIPASE, AMYLASE in the last 168 hours. No results for input(s): AMMONIA in the last 168 hours. Coagulation Profile: No results for input(s): INR, PROTIME in the last 168 hours. Cardiac Enzymes: No results for input(s): CKTOTAL, CKMB, CKMBINDEX, TROPONINI in the last 168 hours. BNP (last 3 results) No results for input(s): PROBNP in the last 8760 hours. HbA1C: No results for input(s): HGBA1C in the last 72 hours. CBG: No results for input(s): GLUCAP in the last 168 hours. Lipid Profile: No results for input(s): CHOL, HDL, LDLCALC, TRIG,  CHOLHDL, LDLDIRECT in the last 72 hours. Thyroid Function Tests: No results for input(s): TSH, T4TOTAL, FREET4, T3FREE, THYROIDAB in the last 72 hours. Anemia Panel: No results for input(s): VITAMINB12, FOLATE, FERRITIN, TIBC, IRON, RETICCTPCT in the last 72 hours. Urine analysis:    Component Value Date/Time   COLORURINE YELLOW 02/17/2016 1502   APPEARANCEUR CLEAR 02/17/2016 1502   LABSPEC 1.005 02/17/2016 1502   PHURINE 7.0 02/17/2016 1502   GLUCOSEU NEGATIVE 02/17/2016 1502   HGBUR NEGATIVE 02/17/2016 1502   BILIRUBINUR NEGATIVE 02/17/2016 1502   KETONESUR NEGATIVE 02/17/2016 1502   PROTEINUR NEGATIVE 02/17/2016 1502   UROBILINOGEN 1.0 01/05/2015 1550   NITRITE NEGATIVE 02/17/2016 1502   LEUKOCYTESUR NEGATIVE 02/17/2016 1502   Sepsis Labs: @LABRCNTIP (procalcitonin:4,lacticidven:4)  ) Recent Results (from the past 240 hour(s))  Urine culture     Status: None   Collection Time: 02/17/16  3:02 PM  Result Value Ref Range Status   Specimen Description URINE, CLEAN CATCH  Final   Special Requests NONE  Final   Culture NO GROWTH 1 DAY  Final   Report Status 02/18/2016 FINAL   Final      Radiology Studies: Dg Chest 2 View  Result Date: 02/17/2016 CLINICAL DATA:  Pt has been having an issue of low BP for a while now, seems worse last few days, every time he stands up his BP drops - fell yesterday, dizziness today - nonsmoker, had open heart surgery earlier this year, diabetes, htn EXAM: CHEST  2 VIEW COMPARISON:  02/04/2016 FINDINGS: Sternotomy wires overlie normal cardiac silhouette. Ectatic aorta. No effusion, infiltrate pneumothorax. Degenerative osteophytosis of the spine. IMPRESSION: No acute cardiopulmonary process. Electronically Signed   By: Suzy Bouchard M.D.   On: 02/17/2016 15:13   Ct Head Wo Contrast  Result Date: 02/17/2016 CLINICAL DATA:  Orthostatics hypotension with fall yesterday. EXAM: CT HEAD WITHOUT CONTRAST TECHNIQUE: Contiguous axial images were obtained from the base of the skull through the vertex without intravenous contrast. COMPARISON:  None. FINDINGS: Brain: The brain shows age related atrophy. No abnormality is seen affecting the brainstem or cerebellum. Within the cerebral hemispheres, there are chronic changes of small vessel disease throughout the white matter. There are old lacunar infarctions in the right basal ganglia. No cortical or large vessel territory infarction. No mass lesion, hemorrhage, hydrocephalus or extra-axial collection. Vascular: There is atherosclerotic calcification of the major vessels at the base of the brain. Skull: No skull fracture or focal lesion. Sinuses/Orbits: Clear/normal Other: None significant IMPRESSION: No acute or traumatic finding. Chronic small-vessel ischemic changes affecting the cerebral hemispheric white matter. 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 Atherosclerotic calcification of the major vessels at the base of the brain. Electronically Signed   By: Nelson Chimes M.D.   On: 02/17/2016 17:06     Scheduled Meds: . aspirin EC  81 mg Oral Daily  . atorvastatin  40 mg Oral Q  T,Th,Sat-1800  . docusate sodium  100 mg Oral BID  . enoxaparin (LOVENOX) injection  40 mg Subcutaneous Q24H  . fludrocortisone  0.1 mg Oral Daily  . gabapentin  300 mg Oral TID  . magnesium oxide  400 mg Oral Daily  . pantoprazole  40 mg Oral Daily  . sodium chloride flush  3 mL Intravenous Q12H   Continuous Infusions:    LOS: 0 days   Time Spent in minutes   30 minutes  Buckley Bradly D.O. on 02/19/2016 at 11:41 AM  Between 7am to 7pm - Pager - [phone]  After 7pm go to [email or web address] - password [email or web address]  And look for the night coverage person covering for me after hours  [facility] Office  [phone]

## 2016-02-20 DIAGNOSIS — I25810 Atherosclerosis of coronary artery bypass graft(s) without angina pectoris: Secondary | ICD-10-CM | POA: Diagnosis not present

## 2016-02-20 DIAGNOSIS — I951 Orthostatic hypotension: Secondary | ICD-10-CM | POA: Diagnosis not present

## 2016-02-20 DIAGNOSIS — E119 Type 2 diabetes mellitus without complications: Secondary | ICD-10-CM | POA: Diagnosis not present

## 2016-02-20 DIAGNOSIS — N183 Chronic kidney disease, stage 3 (moderate): Secondary | ICD-10-CM | POA: Diagnosis not present

## 2016-02-20 LAB — BASIC METABOLIC PANEL
Anion gap: 6 (ref 5–15)
BUN: 25 mg/dL — ABNORMAL HIGH (ref 6–20)
CO2: 28 mmol/L (ref 22–32)
Calcium: 8.6 mg/dL — ABNORMAL LOW (ref 8.9–10.3)
Chloride: 104 mmol/L (ref 101–111)
Creatinine, Ser: 1.59 mg/dL — ABNORMAL HIGH (ref 0.61–1.24)
GFR calc Af Amer: 46 mL/min — ABNORMAL LOW (ref >60)
GFR calc non Af Amer: 39 mL/min — ABNORMAL LOW (ref >60)
Glucose, Bld: 113 mg/dL — ABNORMAL HIGH (ref 65–99)
Potassium: 3.6 mmol/L (ref 3.5–5.1)
Sodium: 138 mmol/L (ref 135–145)

## 2016-02-20 MED ORDER — FLUDROCORTISONE ACETATE 0.1 MG PO TABS: 0.1000 mg | ORAL_TABLET | Freq: Every day | ORAL | 0 refills | Status: DC

## 2016-02-20 NOTE — Discharge Instructions (Signed)

## 2016-02-20 NOTE — Discharge Summary (Signed)
Physician Discharge Summary  [name] [id number] DOB: July 03, 1933 DOA: 02/17/2016  PCP: Dennis Richards, MD  Admit date: 02/17/2016 Discharge date: 02/20/2016  Time spent: 45 minutes  Recommendations for Outpatient Follow-up:  Patient will be discharged to home.  Patient will need to follow up with primary care provider within one week of discharge. Follow up with cardiology, Dr. Stanford Frank, for scheduled appointment. Patient should continue medications as prescribed.  Patient should follow a carb modified diet.   Discharge Diagnoses:  Orthostatic hypotension Coronary artery disease Hyperlipidemia Chronic kidney disease, stage III Diabetes mellitus, type II Constipation GERD  Discharge Condition: stable  Diet recommendation: carb modified  Filed Weights   02/18/16 0525 02/19/16 0552 02/20/16 0702  Weight: 88.1 kg (194 lb 3.2 oz) 87 kg (191 lb 14.4 oz) 87.7 kg (193 lb 4.8 oz)    History of present illness:  on 02/17/2016 by Dr. Fraser Din [name] a 82 y.o.malewith medical history significant of DM, HTN, HLD, CAD s/p CAB presenting with persistent and worsening orthostatic hypotension. He had heart surgery on March 6 - CABG x 4. Unable to go to cardiac rehab because of dizziness and blood pressure dropping. At times BP is so low that it doesn't measure on BP monitor. Hypotension is worse in middle of the night and early morning. Feeling woozy, not dizzy, no spinning. [place] five times, twice in the last 2 weeks. Once in the garden, twice at night getting up to go to the bathroom. Several times legs have gotten shaky and legs have gone out underneath him. Family feels like he has aged overnight. Has been walking with cane or walker just to get his bearings after getting up. Really unable to stand unsupported in the mornings; this subsides to an extent by lunchtime.  Before the surgery, he was also having some mild dizziness and postural  hypotension. It was much milder prior to the surgery. No falls. Symptoms started maybe 3 months prior to the surgery.  He does have some mid-thoracic spine tenderness and it radiates around to RUQ. Has appointment for pain specialist Nov 21. Pain has been present for a year or more, intensified after surgery. Not associated with eating.  He saw Dr. Melford Aase this morning and was diagnosed with severe postural hypotension with concern for autonomic insufficency. BP 85/55 standing. P 106. He reports that the patient was tried on Florinef with recovery of a standing BP, but developed dependent edema and supine HTN and so it was discontinued. He was then started on Midodrine without improvement. Dr. Melford Aase suggested ER visit for labs to check for adrenal insufficiency. Concern about Addison's - tested in office and told this wasn't the correct diagnosis - but patient was on Florinef at the time. Steroids in ER helped transiently.   Hospital Course:  Orthostatic hypotension -ongoing issue -Patient has been on Florinef however developed edema and supine hypertension. Patient has also been on Midodrine with no improvement of hypotension. -Patient's primary care physician as well as cardiologist have been working with patient regarding this orthostatic hypotension. It was thought the patient may have had adrenal insufficiency, chronic cortisol level 3.7. Patient did have ACTH simulation test however at that time was taking Florinef. -Patient's AM cortisol 3.7, ACTH stimulation testing normal  -Cardiology consulted and appreciated -Spoke with nephrology, Dr. Justin Mend.  Recommended flornief 0.1mg  daily and abdominal binder. Not much else to offer at this point. -RN attempted to get patient OOB yesterday, SBP dropped into the 50s. -Discussed fluid intake  with patient and wife at bedside.  Advised patient to increase his fluid intake and well as electrolyte replacement (not purely water).  -Discussed  with patient and wife at bedside, if LE swelling occurs, reduce frequency of Florinef to every other day.  If swelling continues, may need other medication,such as Northera -Follow up with cardiology in one week  Coronary artery disease -Status post CABG in March 2017 -continue statin and aspirin -Currently not on beta blocker due to hypotension  Hyperlipidemia -Continue statin  Chronic kidney disease, stage III -Creatinine currently stable, continue to monitor BMP  Diabetes mellitus, type II -Currently diet controlled. Patient is on any medications at home  Constipation -Continue MiraLAX, Colace, and magnesium citrate as needed  GERD -Continue PPI  Neuropathic pain -Continue gabapentin and elavil  Consultants Cardiology Nephrology, via phone  Procedures  None  Discharge Exam: Vitals:   02/20/16 1014 02/20/16 1015  BP: (!) 77/45 105/69  Pulse: (!) 111 (!) 101  Resp: 20 20  Temp:     Patient denies any chest pain, shortness of breath, abdominal pain, diarrhea, headache, or dizziness.  States he is ready to go home.  Exam  General: Well developed, well nourished, NAD, appears stated age  89: NCAT, mucous membranes moist.   Cardiovascular: S1 S2 auscultated, RRR, no murmurs  Respiratory: Clear to auscultation bilaterally with equal chest rise  Abdomen: Soft, nontender, nondistended, + bowel sounds  Extremities: warm dry without cyanosis clubbing or edema  Neuro: AAOx3, nonfocal  Skin: Without rashes exudates or nodules  Psych: Normal affect and demeanor with intact judgement and insight, pleasant  Discharge Instructions Discharge Instructions    Discharge instructions    Complete by:  As directed    Patient will be discharged to home.  Patient will need to follow up with primary care provider within one week of discharge. Follow up with cardiology, Dr. Stanford Frank, for scheduled appointment. Patient should continue medications as prescribed.   Patient should follow a carb modified diet.  Use abdominal binder during the day and with activity.  You do not have to wear it while sleeping.     Current Discharge Medication List    CONTINUE these medications which have CHANGED   Details  fludrocortisone (FLORINEF) 0.1 MG tablet Take 1 tablet (0.1 mg total) by mouth daily. If leg swelling occurs, take 1 tablet every other day. Qty: 30 tablet, Refills: 0      CONTINUE these medications which have NOT CHANGED   Details  amitriptyline (ELAVIL) 10 MG tablet TAKE 1 TABLET BY MOUTH THREE TIMES DAILY AS NEEDED FOR NEURITIS PAIN Qty: 270 tablet, Refills: 1    aspirin EC 81 MG EC tablet Take 1 tablet (81 mg total) by mouth daily.    atorvastatin (LIPITOR) 80 MG tablet Take 1/2 to 1 tablet daily for Cholesterol or as directed Qty: 90 tablet, Refills: 1   Associated Diagnoses: Hyperlipemia    CALCIUM PO Take 1 tablet by mouth daily.    Flaxseed, Linseed, 1000 MG CAPS Take 1,300 mg by mouth daily.     gabapentin (NEURONTIN) 300 MG capsule Take 300 mg by mouth 3 (three) times daily.    Magnesium 250 MG TABS Take 500 mg by mouth daily.     multivitamin (THERAGRAN) per tablet Take 1 tablet by mouth daily.      omeprazole (PRILOSEC) 20 MG capsule Take 20 mg by mouth at bedtime.        Allergies  Allergen Reactions  . Cymbalta [Duloxetine  Hcl]     Dizziness  . Keflex [Cephalexin] Other (See Comments)    Reaction: unknown   . Simvastatin Other (See Comments)    Reaction: unknown   . Sudafed [Pseudoephedrine]     Dizziness   Follow-up Information    [name],[name], MD. Schedule an appointment as soon as possible for a visit in 1 week(s).   Specialty:  Internal Medicine Why:  Hospital follow up Contact information: [address] [place] [address] [phone]        Kirk Ruths, MD. Schedule an appointment as soon as possible for a visit in 1 week(s).   Specialty:  Cardiology Why:  Hospital  follow up Contact information: [address] [place] [address] [phone]            The results of significant diagnostics from this hospitalization (including imaging, microbiology, ancillary and laboratory) are listed below for reference.    Significant Diagnostic Studies: Dg Chest 2 View  Result Date: 02/17/2016 CLINICAL DATA:  Pt has been having an issue of low BP for a while now, seems worse last few days, every time he stands up his BP drops - fell yesterday, dizziness today - nonsmoker, had open heart surgery earlier this year, diabetes, htn EXAM: CHEST  2 VIEW COMPARISON:  02/04/2016 FINDINGS: Sternotomy wires overlie normal cardiac silhouette. Ectatic aorta. No effusion, infiltrate pneumothorax. Degenerative osteophytosis of the spine. IMPRESSION: No acute cardiopulmonary process. Electronically Signed   By: Suzy Bouchard M.D.   On: 02/17/2016 15:13   Dg Chest 2 View  Result Date: 02/04/2016 CLINICAL DATA:  Patient with drop in blood pressure.  Weakness. EXAM: CHEST  2 VIEW COMPARISON:  Chest radiograph 08/05/2015. FINDINGS: Patient is rotated to the right. Normal cardiac and mediastinal contours. Tortuosity the thoracic aorta. No consolidative pulmonary opacities. No pleural effusion or pneumothorax. Mid thoracic spine degenerative changes. IMPRESSION: No acute cardiopulmonary process. Electronically Signed   By: Lovey Newcomer M.D.   On: 02/04/2016 11:29   Ct Head Wo Contrast  Result Date: 02/17/2016 CLINICAL DATA:  Orthostatics hypotension with fall yesterday. EXAM: CT HEAD WITHOUT CONTRAST TECHNIQUE: Contiguous axial images were obtained from the base of the skull through the vertex without intravenous contrast. COMPARISON:  None. FINDINGS: Brain: The brain shows age related atrophy. No abnormality is seen affecting the brainstem or cerebellum. Within the cerebral hemispheres, there are chronic changes of small vessel disease throughout the white matter.  There are old lacunar infarctions in the right basal ganglia. No cortical or large vessel territory infarction. No mass lesion, hemorrhage, hydrocephalus or extra-axial collection. Vascular: There is atherosclerotic calcification of the major vessels at the base of the brain. Skull: No skull fracture or focal lesion. Sinuses/Orbits: Clear/normal Other: None significant IMPRESSION: No acute or traumatic finding. Chronic small-vessel ischemic changes affecting the cerebral hemispheric white matter. 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 Atherosclerotic calcification of the major vessels at the base of the brain. Electronically Signed   By: Nelson Chimes M.D.   On: 02/17/2016 17:06    Microbiology: Recent Results (from the past 240 hour(s))  Urine culture     Status: None   Collection Time: 02/17/16  3:02 PM  Result Value Ref Range Status   Specimen Description URINE, CLEAN CATCH  Final   Special Requests NONE  Final   Culture NO GROWTH 1 DAY  Final   Report Status 02/18/2016 FINAL  Final     Labs: Basic Metabolic Panel:  Recent  Labs Lab 02/17/16 1310 02/18/16 0831 02/19/16 0838 02/20/16 0338  NA 138 140 137 138  K 4.7 4.1 3.8 3.6  CL 103 105 102 104  CO2 28 28 28 28   GLUCOSE 121* 86 140* 113*  BUN 20 19 21* 25*  CREATININE 1.65* 1.37* 1.65* 1.59*  CALCIUM 9.4 8.9 8.5* 8.6*   Liver Function Tests: No results for input(s): AST, ALT, ALKPHOS, BILITOT, PROT, ALBUMIN in the last 168 hours. No results for input(s): LIPASE, AMYLASE in the last 168 hours. No results for input(s): AMMONIA in the last 168 hours. CBC:  Recent Labs Lab 02/17/16 1310 02/18/16 0831 02/19/16 0838  WBC 9.3 7.1 7.1  HGB 12.0* 10.9* 10.6*  HCT 38.1* 33.5* 33.5*  MCV 92.5 91.3 92.0  PLT 225 199 191   Cardiac Enzymes: No results for input(s): CKTOTAL, CKMB, CKMBINDEX, TROPONINI in the last 168 hours. BNP: BNP (last 3 results) No results for input(s): BNP in the last 8760 hours.  ProBNP  (last 3 results) No results for input(s): PROBNP in the last 8760 hours.  CBG: No results for input(s): GLUCAP in the last 168 hours.     SignedCristal Ford  [facility] 02/20/2016, 11:11 AM

## 2016-02-25 ENCOUNTER — Ambulatory Visit (INDEPENDENT_AMBULATORY_CARE_PROVIDER_SITE_OTHER): Payer: Medicare Other | Admitting: Internal Medicine

## 2016-02-25 VITALS — BP 152/100 | HR 100 | Temp 97.8°F | Resp 16 | Ht 72.5 in | Wt 201.6 lb

## 2016-02-25 DIAGNOSIS — R7303 Prediabetes: Secondary | ICD-10-CM

## 2016-02-25 DIAGNOSIS — Z79899 Other long term (current) drug therapy: Secondary | ICD-10-CM

## 2016-02-25 DIAGNOSIS — G903 Multi-system degeneration of the autonomic nervous system: Secondary | ICD-10-CM | POA: Diagnosis not present

## 2016-02-25 DIAGNOSIS — I10 Essential (primary) hypertension: Secondary | ICD-10-CM | POA: Diagnosis not present

## 2016-02-25 DIAGNOSIS — E782 Mixed hyperlipidemia: Secondary | ICD-10-CM

## 2016-02-25 DIAGNOSIS — I951 Orthostatic hypotension: Secondary | ICD-10-CM | POA: Insufficient documentation

## 2016-02-25 DIAGNOSIS — E559 Vitamin D deficiency, unspecified: Secondary | ICD-10-CM | POA: Diagnosis not present

## 2016-02-25 LAB — CBC WITH DIFFERENTIAL/PLATELET
Basophils Absolute: 65 cells/uL (ref 0–200)
Basophils Relative: 1 %
Eosinophils Absolute: 325 cells/uL (ref 15–500)
Eosinophils Relative: 5 %
HCT: 36.2 % — ABNORMAL LOW (ref 38.5–50.0)
Hemoglobin: 11.6 g/dL — ABNORMAL LOW (ref 13.2–17.1)
Lymphocytes Relative: 32 %
Lymphs Abs: 2080 cells/uL (ref 850–3900)
MCH: 29.5 pg (ref 27.0–33.0)
MCHC: 32 g/dL (ref 32.0–36.0)
MCV: 92.1 fL (ref 80.0–100.0)
MPV: 9.4 fL (ref 7.5–12.5)
Monocytes Absolute: 520 cells/uL (ref 200–950)
Monocytes Relative: 8 %
Neutro Abs: 3510 cells/uL (ref 1500–7800)
Neutrophils Relative %: 54 %
Platelets: 241 10*3/uL (ref 140–400)
RBC: 3.93 MIL/uL — ABNORMAL LOW (ref 4.20–5.80)
RDW: 14.8 % (ref 11.0–15.0)
WBC: 6.5 10*3/uL (ref 3.8–10.8)

## 2016-02-25 LAB — TSH: TSH: 3.48 mIU/L (ref 0.40–4.50)

## 2016-02-25 NOTE — Patient Instructions (Signed)
Near-Syncope --------------------------------- Near-syncope (commonly known as near fainting) is sudden weakness, dizziness, or feeling like you might pass out. During an episode of near-syncope, you may also develop pale skin, have tunnel vision, or feel sick to your stomach (nauseous). Near-syncope may occur when getting up after sitting or while standing for a long time. It is caused by a sudden decrease in blood flow to the brain. This decrease can result from various causes or triggers, most of which are not serious. However, because near-syncope can sometimes be a sign of something serious, a medical evaluation is required. The specific cause is often not determined. HOME CARE INSTRUCTIONS  Monitor your condition for any changes. The following actions may help to alleviate any discomfort you are experiencing:  Have someone stay with you until you feel stable.  Lie down right away and prop your feet up if you start feeling like you might faint. Breathe deeply and steadily. Wait until all the symptoms have passed. Most of these episodes last only a few minutes. You may feel tired for several hours.   Drink enough fluids to keep your urine clear or pale yellow.   If you are taking blood pressure or heart medicine, get up slowly when seated or lying down. Take several minutes to sit and then stand. This can reduce dizziness.  Follow up with your health care provider as directed. SEEK IMMEDIATE MEDICAL CARE IF:   You have a severe headache.   You have unusual pain in the chest, abdomen, or back.   You are bleeding from the mouth or rectum, or you have black or tarry stool.   You have an irregular or very fast heartbeat.   You have repeated fainting or have seizure-like jerking during an episode.   You faint when sitting or lying down.   You have confusion.   You have difficulty walking.   You have severe weakness.   You have vision problems.

## 2016-02-25 NOTE — Progress Notes (Signed)
[facility] [name], M.D.        Uvaldo Bristle. Silverio Lay, P.A.-C       Starlyn Skeans, P.A.-C  [facility]                [address] [place] [id number] Telephone [phone] Telefax [phone] ______________________________________________________________________     This very nice 81 y.o. MWM presents for post hospital  follow up for dysautonomia with severe postural orthostatic hypotension and supine Hypertension, Hyperlipidemia, Pre-Diabetes and Vitamin D Deficiency.      Patient has long hx/o HTN predating circa 1995 and was controlled and has hx/o hx/o PTCA x 2 (2005 & 2011) and until recently undergoing CABG in Mar 2017 - subsequently developed problems with postural orthostasis.  Patient failed treatment with Midodrine and was given Florinef wit BP improved, but it was d/c'd as he developed dependent edema.  Then patient was recently hospitalized 10/18-10/21/2017  after several falls consequent of his severe postural hypotension and decision was made to re-institute his Florinef. Today's BP is 152/100 standing & sitting - 126/74. Patient has had no complaints of any cardiac type chest pain, palpitations, dyspnea/orthopnea/PND,  claudication, or dependent edema.      Patient has been advised to only check standing BP's and NOT to check either sitting or lying BP's as he will have Supine HTN and decisions should not be made on those readings. Also, he is advised that he will likely need to accept a degree of dependent edema.      Hyperlipidemia is controlled with diet & meds. Patient denies myalgias or other med SE's. Last Lipids were not at goal.  Lab Results  Component Value Date   CHOL 171 10/29/2015   HDL 31 (L) 10/29/2015   LDLCALC 116 10/29/2015   TRIG 118 10/29/2015   CHOLHDL 5.5 (H) 10/29/2015      Also, the patient has history of PreDiabetes (2008) and has had no symptoms of  reactive hypoglycemia, diabetic polys, paresthesias or visual blurring.  Last A1c was not at goal:  Lab Results  Component Value Date   HGBA1C 6.2 (H) 02/09/2016      Further, the patient also has history of Vitamin D Deficiency and supplements vitamin D without any suspected side-effects. Last vitamin D was at goal:  Lab Results  Component Value Date   VD25OH 87 10/29/2015   Current Outpatient Prescriptions on File Prior to Visit  Medication Sig  . amitriptyline (ELAVIL) 10 MG tablet TAKE 1 TABLET BY MOUTH THREE TIMES DAILY AS NEEDED FOR NEURITIS PAIN (Patient taking differently: TAKE 10mg  TABLET BY MOUTH THREE TIMES DAILY AS NEEDED FOR NEURITIS PAIN)  . aspirin EC 81 MG EC tablet Take 1 tablet (81 mg total) by mouth daily.  Marland Kitchen atorvastatin (LIPITOR) 80 MG tablet Take 1/2 to 1 tablet daily for Cholesterol or as directed (Patient taking differently: Take 40 mg by mouth every evening. Tues, Thurs, and Sat)  . CALCIUM PO Take 1 tablet by mouth daily.  . Flaxseed, Linseed, 1000 MG CAPS Take 1,300 mg by mouth daily.   . fludrocortisone (FLORINEF) 0.1 MG tablet Take 1 tablet (0.1 mg total) by mouth daily. If leg swelling occurs, take 1 tablet every other day.  . gabapentin (NEURONTIN) 300 MG capsule Take 300 mg by mouth 3 (three) times daily.  . Magnesium 250 MG TABS Take 500 mg  by mouth daily.   . multivitamin (THERAGRAN) per tablet Take 1 tablet by mouth daily.    Marland Kitchen omeprazole (PRILOSEC) 20 MG capsule Take 20 mg by mouth at bedtime.    Current Facility-Administered Medications on File Prior to Visit  Medication  . midodrine (PROAMATINE) tablet 2.5 mg   Allergies  Allergen Reactions  . Cymbalta [Duloxetine Hcl]     Dizziness  . Keflex [Cephalexin] Other (See Comments)    Reaction: unknown   . Simvastatin Other (See Comments)    Reaction: unknown   . Sudafed [Pseudoephedrine]     Dizziness   PMHx:   Past Medical History:  Diagnosis Date  . 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 Colon  polyps   . Coronary atherosclerosis of unspecified type of vessel, native or graft   . Diabetes mellitus    Diet control   . Difficult intubation   . Esophageal reflux   . Gastritis, chronic   . Hypertension   . IBS (irritable bowel syndrome)   . Orthostatic hypotension    "BP has been dropping alot when I stand up for the last month or so" (02/17/2016)  . Other and unspecified hyperlipidemia   . Personal history of other diseases of digestive system   . Vitamin B 12 deficiency   . Vitamin D deficiency    Immunization History  Administered Date(s) Administered  . Influenza, High Dose Seasonal PF 12/30/2013, 02/02/2015, 01/27/2016  . Influenza-Unspecified 01/13/2013  . Pneumococcal Conjugate-13 02/14/2014  . Pneumococcal-Unspecified 06/15/2004  . Tetanus 10/01/2012  . Zoster 10/01/2012   Past Surgical History:  Procedure Laterality Date  . BLADDER SURGERY  1969   traumatic pelvic fractures, urethral and bladder repair  . CARDIAC CATHETERIZATION N/A 07/01/2015   Procedure: Left Heart Cath and Coronary Angiography;  Surgeon: Wellington Hampshire, MD;  Location: [facility];  Service: Cardiovascular;  Laterality: N/A;  . CORONARY ANGIOPLASTY WITH STENT PLACEMENT    . CORONARY ARTERY BYPASS GRAFT N/A 07/06/2015   Procedure: CORONARY ARTERY BYPASS GRAFTING (CABG)x 4   utilizing the left internal mammary artery and endoscopically harvested bilateral  sapheneous vein.;  Surgeon: Ivin Poot, MD;  Location: [facility];  Service: Open Heart Surgery;  Laterality: N/A;  . KNEE SURGERY    . TEE WITHOUT CARDIOVERSION N/A 07/06/2015   Procedure: TRANSESOPHAGEAL ECHOCARDIOGRAM (TEE);  Surgeon: Ivin Poot, MD;  Location: [facility];  Service: Open Heart Surgery;  Laterality: N/A;   FHx:    Reviewed / unchanged  SHx:    Reviewed / unchanged  Systems Review:  Constitutional: Denies fever, chills, wt changes, headaches, insomnia, fatigue, night sweats, change in appetite. Eyes: Denies redness,  blurred vision, diplopia, discharge, itchy, watery eyes.  ENT: Denies discharge, congestion, post nasal drip, epistaxis, sore throat, earache, hearing loss, dental pain, tinnitus, vertigo, sinus pain, snoring.  CV: Denies chest pain, palpitations, irregular heartbeat,  dyspnea, diaphoresis, orthopnea, PND, claudication or edema. Respiratory: denies cough, dyspnea, DOE, pleurisy, hoarseness, laryngitis, wheezing.  Gastrointestinal: Denies dysphagia, odynophagia, heartburn, reflux, water brash, abdominal pain or cramps, nausea, vomiting, bloating, diarrhea, constipation, hematemesis, melena, hematochezia  or hemorrhoids. Genitourinary: Denies dysuria, frequency, urgency, nocturia, hesitancy, discharge, hematuria or flank pain. Musculoskeletal: Denies arthralgias, myalgias, stiffness, jt. swelling, pain, limping or strain/sprain.  Skin: Denies pruritus, rash, hives, warts, acne, eczema or change in skin lesion(s). Neuro: No weakness, tremor, incoordination, spasms, paresthesia or pain. Psychiatric: Denies confusion, memory loss or sensory loss. Endo: Denies change in weight, skin or hair  change.  Heme/Lymph: No excessive bleeding, bruising or enlarged lymph nodes.  Physical Exam BP (!) 152/100 Comment: standing-152/100 / sit-126/74  Pulse 100   Temp 97.8 F (36.6 C)   Resp 16   Ht 6' 0.5" (1.842 m)   Wt 201 lb 9.6 oz (91.4 kg)   BMI 26.97 kg/m   Appears well nourished and in no distress.  Eyes: PERRLA, EOMs, conjunctiva no swelling or erythema. Sinuses: No frontal/maxillary tenderness ENT/Mouth: EAC's clear, TM's nl w/o erythema, bulging. Nares clear w/o erythema, swelling, exudates. Oropharynx clear without erythema or exudates. Oral hygiene is good. Tongue normal, non obstructing. Hearing intact.  Neck: Supple. Thyroid nl. Car 2+/2+ without bruits, nodes or JVD. Chest: Respirations nl with BS clear & equal w/o rales, rhonchi, wheezing or stridor.  Cor: Heart sounds normal w/ regular  rate and rhythm without sig. murmurs, gallops, clicks, or rubs. Peripheral pulses normal and equal  without edema.  Abdomen: Soft & bowel sounds normal. Non-tender w/o guarding, rebound, hernias, masses, or organomegaly.  Lymphatics: Unremarkable.  Musculoskeletal: Full ROM all peripheral extremities, joint stability, 5/5 strength, and normal gait.  Skin: Warm, dry without exposed rashes, lesions or ecchymosis apparent.  Neuro: Cranial nerves intact, reflexes equal bilaterally. Sensory-motor testing grossly intact. Tendon reflexes grossly intact.  Pysch: Alert & oriented x 3.  Insight and judgement nl & appropriate. No ideations.  Assessment and Plan:  1. Dysautonomia orthostatic hypotension syndrome ([place])  - Patient has been advised to only check standing BP's and NOT to check either sitting or lying BP's as he will have Supine HTN and decisions should not be made on those readings. Also, he is advised that he will likely need to accept a degree of dependent edema.   2. Supine hypertension  - Continue medication, monitor blood pressure at home. Continue DASH diet. Reminder to go to the ER if any CP, SOB, nausea, dizziness, severe HA, changes vision/speech, left arm numbness and tingling and jaw pain. - TSH  3. Hyperlipidemia - Continue diet/meds, exercise,& lifestyle modifications.  - Continue monitor periodic cholesterol/liver & renal functions  - TSH - Lipid panel  4. Prediabetes  - Continue diet, exercise, lifestyle modifications. Monitor appropriate labs.  5. Vitamin D deficiency  - Continue supplementation. - VITAMIN D 25 Hydroxy   6. Medication management  - CBC with Differential/Platelet - BASIC METABOLIC PANEL WITH GFR - Hepatic function panel - Magnesium       Recommended regular exercise, BP monitoring, weight control, and discussed med and SE's. Recommended labs to assess and monitor clinical status. Further disposition pending results of labs. Over 45 minutes of  exam, counseling, chart review was performed

## 2016-02-26 ENCOUNTER — Encounter: Payer: Self-pay | Admitting: Cardiology

## 2016-02-26 ENCOUNTER — Ambulatory Visit (INDEPENDENT_AMBULATORY_CARE_PROVIDER_SITE_OTHER): Payer: Medicare Other | Admitting: Cardiology

## 2016-02-26 DIAGNOSIS — I951 Orthostatic hypotension: Secondary | ICD-10-CM

## 2016-02-26 DIAGNOSIS — N183 Chronic kidney disease, stage 3 unspecified: Secondary | ICD-10-CM

## 2016-02-26 DIAGNOSIS — Z951 Presence of aortocoronary bypass graft: Secondary | ICD-10-CM | POA: Diagnosis not present

## 2016-02-26 DIAGNOSIS — G588 Other specified mononeuropathies: Secondary | ICD-10-CM | POA: Diagnosis not present

## 2016-02-26 LAB — BASIC METABOLIC PANEL WITH GFR
BUN: 18 mg/dL (ref 7–25)
CO2: 27 mmol/L (ref 20–31)
Calcium: 9.1 mg/dL (ref 8.6–10.3)
Chloride: 104 mmol/L (ref 98–110)
Creat: 1.41 mg/dL — ABNORMAL HIGH (ref 0.70–1.11)
GFR, Est African American: 54 mL/min — ABNORMAL LOW (ref >=60)
GFR, Est Non African American: 47 mL/min — ABNORMAL LOW (ref >=60)
Glucose, Bld: 127 mg/dL — ABNORMAL HIGH (ref 65–99)
Potassium: 3.8 mmol/L (ref 3.5–5.3)
Sodium: 143 mmol/L (ref 135–146)

## 2016-02-26 LAB — LIPID PANEL
Cholesterol: 155 mg/dL (ref 125–200)
HDL: 32 mg/dL — ABNORMAL LOW (ref >=40)
LDL Cholesterol: 83 mg/dL (ref <130)
Total CHOL/HDL Ratio: 4.8 Ratio (ref <=5.0)
Triglycerides: 200 mg/dL — ABNORMAL HIGH (ref <150)
VLDL: 40 mg/dL — ABNORMAL HIGH (ref <30)

## 2016-02-26 LAB — HEPATIC FUNCTION PANEL
ALT: 8 U/L — ABNORMAL LOW (ref 9–46)
AST: 15 U/L (ref 10–35)
Albumin: 3.8 g/dL (ref 3.6–5.1)
Alkaline Phosphatase: 101 U/L (ref 40–115)
Bilirubin, Direct: 0.1 mg/dL (ref <=0.2)
Indirect Bilirubin: 0.2 mg/dL (ref 0.2–1.2)
Total Bilirubin: 0.3 mg/dL (ref 0.2–1.2)
Total Protein: 6.3 g/dL (ref 6.1–8.1)

## 2016-02-26 LAB — MAGNESIUM: Magnesium: 2 mg/dL (ref 1.5–2.5)

## 2016-02-26 LAB — VITAMIN D 25 HYDROXY (VIT D DEFICIENCY, FRACTURES): Vit D, 25-Hydroxy: 74 ng/mL (ref 30–100)

## 2016-02-26 NOTE — Assessment & Plan Note (Signed)
LIMA-LAD, SVG-Dx, SVG-OM, SVG-RCA 07/07/15

## 2016-02-26 NOTE — Assessment & Plan Note (Signed)
Pt here for follow up after recent hospitalization and re institution of Florinef Rx. He is still significantly orthostatic in the office today, though no syncopal spells since discharge.

## 2016-02-26 NOTE — Assessment & Plan Note (Signed)
Last SCr 1.4

## 2016-02-26 NOTE — Patient Instructions (Addendum)
Medication Instructions:  Your physician recommends that you continue on your current medications as directed. Please refer to the Current Medication list given to you today.  Labwork: None   Testing/Procedures: None   Follow-Up: Your physician recommends that you schedule a follow-up appointment in: February 2018, you will receive a call in December to set up your next appointment.  Any Other Special Instructions Will Be Listed Below (If Applicable).     If you need a refill on your cardiac medications before your next appointment, please call your pharmacy.

## 2016-02-26 NOTE — Assessment & Plan Note (Signed)
Unable to tolerate abdominal binder

## 2016-02-26 NOTE — Progress Notes (Signed)
02/26/2016 [name]   Jul 25, 1933  [id number]  Primary Physician [name],[name], MD Primary Cardiologist: Dr Stanford Breed  HPI:  82 y/o male with known CAD s/p PCI to LAD 2009 and RCA in 2011. He presented to the ED 07/01/15 with chest pain and elevated Troponin. Cath revealed 3 V CAD, echo showed normal LVF. He underwent CABG x 4 07/07/15. Post op course was unremarkable. After DC he noted orthostatic dizziness. He admits that this problem has been present since Sept 2016. He failed Midodrine "didn't work" and could not tolerate Florinef secondary to supine hypertension and LE edema. Florinef was resumed at 0.1 mg daily 02/11/16 secondary to symptomatic orthostasis but he ended up being admitted 02/17/16 after he had two falls at home. Florinef was increased to BID and fluid and salt intake liberalized. He just saw Dr Melford Aase 02/25/16 and his B/P was 150/100 standing. He is in our office today for cardiology follow up. I took his B/P in both arms sitting and standing at one minute and standing at 5 minutes. His sitting BP was 102/64, standing at one minute 62/42, and 62/40 at 5 minutes. He denied dizziness but he did hold on to the exam table while standing. His wife noted that his B/P is better in the after noon. I saw him at 9 am and Dr Melford Aase saw him at 4pm.    Current Outpatient Prescriptions  Medication Sig Dispense Refill  . amitriptyline (ELAVIL) 10 MG tablet TAKE 1 TABLET BY MOUTH THREE TIMES DAILY AS NEEDED FOR NEURITIS PAIN (Patient taking differently: TAKE 10mg  TABLET BY MOUTH THREE TIMES DAILY AS NEEDED FOR NEURITIS PAIN) 270 tablet 1  . aspirin EC 81 MG EC tablet Take 1 tablet (81 mg total) by mouth daily.    Marland Kitchen atorvastatin (LIPITOR) 80 MG tablet Take 80 mg by mouth daily. Take one half tablet on tues, thur and Saturday. One tablet on the other days    . CALCIUM PO Take 1 tablet by mouth daily.    . cyanocobalamin 1000 MCG tablet Take 1,000 mcg by mouth daily.    .  Flaxseed, Linseed, 1000 MG CAPS Take 1,300 mg by mouth daily.     . fludrocortisone (FLORINEF) 0.1 MG tablet Take 2 tablets by mouth daily.    Marland Kitchen gabapentin (NEURONTIN) 300 MG capsule Take 300 mg by mouth 3 (three) times daily.    . Magnesium 250 MG TABS Take 500 mg by mouth daily.     . multivitamin (THERAGRAN) per tablet Take 1 tablet by mouth daily.      Marland Kitchen omeprazole (PRILOSEC) 20 MG capsule Take 20 mg by mouth at bedtime.      Current Facility-Administered Medications  Medication Dose Route Frequency Provider Last Rate Last Dose  . midodrine (PROAMATINE) tablet 2.5 mg  2.5 mg Oral TID WC Erlene Quan, PA-C        Allergies  Allergen Reactions  . Cymbalta [Duloxetine Hcl]     Dizziness  . Keflex [Cephalexin] Other (See Comments)    Reaction: unknown   . Simvastatin Other (See Comments)    Reaction: unknown   . Sudafed [Pseudoephedrine]     Dizziness    Social History   Social History  . Marital status: Married    Spouse name: N/A  . Number of children: 4   . Years of education: N/A   Occupational History  . Retired    Social History Main Topics  . Smoking status: Former Smoker  Quit date: 07/16/1963  . Smokeless tobacco: Never Used  . Alcohol use No  . Drug use: No  . Sexual activity: Not Currently   Other Topics Concern  . Not on file   Social History Narrative   1 caffeine drink daily      Review of Systems: General: negative for chills, fever, night sweats or weight changes.  Cardiovascular: negative for chest pain, dyspnea on exertion, edema, orthopnea, palpitations, paroxysmal nocturnal dyspnea or shortness of breath Dermatological: negative for rash Respiratory: negative for cough or wheezing Urologic: negative for hematuria Abdominal: negative for nausea, vomiting, diarrhea, bright red blood per rectum, melena, or hematemesis Neurologic: negative for visual changes, syncope, or dizziness All other systems reviewed and are otherwise negative except  as noted above.    Blood pressure (!) 82/55, pulse (!) 115, height 6' (1.829 m), weight 201 lb 9.6 oz (91.4 kg).  General appearance: alert, cooperative and no distress Neck: no carotid bruit and no JVD Lungs: decreased Lt base Heart: regular rate and rhythm Extremities: no edema, compression stockings in place Skin: warm and dry Neurologic: Grossly normal   ASSESSMENT AND PLAN:   Orthostatic hypotension Pt here for follow up after recent hospitalization and re institution of Florinef Rx. He is still significantly orthostatic in the office today, though no syncopal spells since discharge.  S/P CABG x 4 LIMA-LAD, SVG-Dx, SVG-OM, SVG-RCA 07/07/15  Intercostal neuralgia Unable to tolerate abdominal binder  CKD (chronic kidney disease), stage III Last SCr 1.4   PLAN  Discussed with Dr Stanford Breed- few options. He could not tolerate the abdominal binder secondary to neuralgia Rt chest. He seems to be better in the afternoon, I saw him shortly after taking his medications this am and his B/P was low, Dr Melford Aase saw him later in the afternoon and his B/P was high. If he becomes symptomatic I think I would have him try 0.2 mg Florinef in the PM and 0.05mg  in the am. The only other option would be to re challenge him with Midodrine. F/U Dr Stanford Breed 3 months.   Kerin Ransom PA-C 02/26/2016 9:48 AM

## 2016-03-09 ENCOUNTER — Ambulatory Visit (INDEPENDENT_AMBULATORY_CARE_PROVIDER_SITE_OTHER): Payer: Medicare Other | Admitting: Internal Medicine

## 2016-03-09 ENCOUNTER — Encounter: Payer: Self-pay | Admitting: Internal Medicine

## 2016-03-09 VITALS — BP 78/52 | HR 90 | Temp 98.0°F | Resp 16 | Ht 72.0 in | Wt 200.0 lb

## 2016-03-09 DIAGNOSIS — M792 Neuralgia and neuritis, unspecified: Secondary | ICD-10-CM

## 2016-03-09 DIAGNOSIS — G903 Multi-system degeneration of the autonomic nervous system: Secondary | ICD-10-CM

## 2016-03-09 DIAGNOSIS — I951 Orthostatic hypotension: Secondary | ICD-10-CM

## 2016-03-09 MED ORDER — GABAPENTIN 100 MG PO CAPS: 100.0000 mg | ORAL_CAPSULE | Freq: Three times a day (TID) | ORAL | 0 refills | Status: DC

## 2016-03-09 NOTE — Progress Notes (Signed)
Assessment and Plan:   1. Orthostatic hypotension dysautonomic syndrome (HCC) -increase florinef to 0.1 mg in the morning and 0.2 mg in the afternoons -monitor blood pressure -discussed wearing a low back neoprene wrap to get some benefit of the abdominal binder without such a high rise to the abdomen. -will accept supine and seated HTN to achieve standing pressures > 100/60 -no significant peripheral edema on exam -wear compression socks daily -increase salt intake in diet -if no relief will consider referral to tertiary center as patient and wife are very frustrated. -can also consider TID dosing of 0.1 mg florinef  2. Neuralgia -cont elavil -decrease gabapentin to 100 mg TID -patient aware that he cannot have any sedating medications due to poor BP      HPI 82 y.o.male presents for 2 week follow-up of dysautonomic orthostatic hypotension.  Patient is currently taking 0.1 mg of florinef twice daily.  He generally not getting standing blood pressure much over 100/60.  He has not fallen since our last meeting but he still gets dizzy, has some tremors and jerking of his arms at bedtime and also has some anxiety.  He reports that he could not tolerate the abdominal binder.  He has been wearing compression socks and also has been eating salty foods. He is struggling with daily activities like showering without becoming worn out and short of breath.     Past Medical History:  Diagnosis Date  . 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 Colon polyps   . Coronary atherosclerosis of unspecified type of vessel, native or graft   . Diabetes mellitus    Diet control   . Difficult intubation   . Esophageal reflux   . Gastritis, chronic   . Hypertension   . IBS (irritable bowel syndrome)   . Orthostatic hypotension    "BP has been dropping alot when I stand up for the last month or so" (02/17/2016)  . Other and unspecified hyperlipidemia   . Personal history of other diseases of digestive system    . Vitamin B 12 deficiency   . Vitamin D deficiency      Allergies  Allergen Reactions  . Cymbalta [Duloxetine Hcl]     Dizziness  . Keflex [Cephalexin] Other (See Comments)    Reaction: unknown   . Simvastatin Other (See Comments)    Reaction: unknown   . Sudafed [Pseudoephedrine]     Dizziness      Current Outpatient Prescriptions on File Prior to Visit  Medication Sig Dispense Refill  . amitriptyline (ELAVIL) 10 MG tablet TAKE 1 TABLET BY MOUTH THREE TIMES DAILY AS NEEDED FOR NEURITIS PAIN (Patient taking differently: TAKE 10mg  TABLET BY MOUTH THREE TIMES DAILY AS NEEDED FOR NEURITIS PAIN) 270 tablet 1  . aspirin EC 81 MG EC tablet Take 1 tablet (81 mg total) by mouth daily.    Marland Kitchen atorvastatin (LIPITOR) 80 MG tablet Take 80 mg by mouth daily. Take one half tablet on tues, thur and Saturday. One tablet on the other days    . CALCIUM PO Take 1 tablet by mouth daily.    . cyanocobalamin 1000 MCG tablet Take 1,000 mcg by mouth daily.    . Flaxseed, Linseed, 1000 MG CAPS Take 1,300 mg by mouth daily.     . fludrocortisone (FLORINEF) 0.1 MG tablet Take 2 tablets by mouth daily.    Marland Kitchen gabapentin (NEURONTIN) 300 MG capsule Take 300 mg by mouth 3 (three) times daily.    . Magnesium 250  MG TABS Take 500 mg by mouth daily.     . multivitamin (THERAGRAN) per tablet Take 1 tablet by mouth daily.      Marland Kitchen omeprazole (PRILOSEC) 20 MG capsule Take 20 mg by mouth at bedtime.      Current Facility-Administered Medications on File Prior to Visit  Medication Dose Route Frequency Provider Last Rate Last Dose  . midodrine (PROAMATINE) tablet 2.5 mg  2.5 mg Oral TID WC Luke K Kilroy, PA-C        ROS: all negative except above.   Physical Exam: Filed Weights   03/09/16 1125  Weight: 200 lb (90.7 kg)   BP (!) 78/52 (Patient Position: Standing)   Pulse 90   Temp 98 F (36.7 C) (Temporal)   Resp 16   Ht 6' (1.829 m)   Wt 200 lb (90.7 kg)   BMI 27.12 kg/m  General Appearance: Well developed  well nourished, non-toxic appearing in no apparent distress.  Tired and chronically ill appearing, pale Eyes: PERRLA, EOMs, conjunctiva w/ no swelling or erythema or discharge Sinuses: No Frontal/maxillary tenderness ENT/Mouth: Ear canals clear without swelling or erythema.  TM's normal bilaterally with no retractions, bulging, or loss of landmarks.   Neck: Supple, thyroid normal, no notable JVD  Respiratory: Respiratory effort normal, Clear breath sounds anteriorly and posteriorly bilaterally without rales, rhonchi, wheezing or stridor. No retractions or accessory muscle usage. Cardio: RRR with no RGs. 2+ murmur on left sternal border   Abdomen: Soft, + BS.  Non tender, no guarding, rebound, hernias, masses.  Musculoskeletal: Full ROM, 5/5 strength, normal gait, mild tremor in bilateral hands, walks slowly and slightly slumped over.   Skin: Warm, dry without rashes  Neuro: Awake and oriented X 3, Cranial nerves intact. Normal muscle tone, no cerebellar symptoms. Sensation intact.  Psych: normal affect, Insight and Judgment appropriate.     Starlyn Skeans, PA-C 11:33 AM [facility]

## 2016-03-17 ENCOUNTER — Other Ambulatory Visit: Payer: Self-pay | Admitting: *Deleted

## 2016-03-17 MED ORDER — FLUDROCORTISONE ACETATE 0.1 MG PO TABS: 200.0000 ug | ORAL_TABLET | Freq: Every day | ORAL | 3 refills | Status: DC

## 2016-03-22 ENCOUNTER — Encounter: Payer: Medicare Other | Attending: Physical Medicine & Rehabilitation | Admitting: Physical Medicine & Rehabilitation

## 2016-03-22 ENCOUNTER — Encounter: Payer: Self-pay | Admitting: Physical Medicine & Rehabilitation

## 2016-03-22 VITALS — BP 144/89 | HR 97 | Resp 14

## 2016-03-22 DIAGNOSIS — M4120 Other idiopathic scoliosis, site unspecified: Secondary | ICD-10-CM | POA: Insufficient documentation

## 2016-03-22 DIAGNOSIS — Z8249 Family history of ischemic heart disease and other diseases of the circulatory system: Secondary | ICD-10-CM | POA: Diagnosis not present

## 2016-03-22 DIAGNOSIS — E1342 Other specified diabetes mellitus with diabetic polyneuropathy: Secondary | ICD-10-CM

## 2016-03-22 DIAGNOSIS — M4125 Other idiopathic scoliosis, thoracolumbar region: Secondary | ICD-10-CM

## 2016-03-22 DIAGNOSIS — K219 Gastro-esophageal reflux disease without esophagitis: Secondary | ICD-10-CM | POA: Insufficient documentation

## 2016-03-22 DIAGNOSIS — G903 Multi-system degeneration of the autonomic nervous system: Secondary | ICD-10-CM

## 2016-03-22 DIAGNOSIS — Z87891 Personal history of nicotine dependence: Secondary | ICD-10-CM | POA: Diagnosis not present

## 2016-03-22 DIAGNOSIS — I2510 Atherosclerotic heart disease of native coronary artery without angina pectoris: Secondary | ICD-10-CM | POA: Diagnosis not present

## 2016-03-22 DIAGNOSIS — E785 Hyperlipidemia, unspecified: Secondary | ICD-10-CM | POA: Diagnosis not present

## 2016-03-22 DIAGNOSIS — M4185 Other forms of scoliosis, thoracolumbar region: Secondary | ICD-10-CM | POA: Insufficient documentation

## 2016-03-22 DIAGNOSIS — G588 Other specified mononeuropathies: Secondary | ICD-10-CM | POA: Diagnosis not present

## 2016-03-22 DIAGNOSIS — R0789 Other chest pain: Secondary | ICD-10-CM | POA: Diagnosis not present

## 2016-03-22 DIAGNOSIS — E559 Vitamin D deficiency, unspecified: Secondary | ICD-10-CM | POA: Insufficient documentation

## 2016-03-22 DIAGNOSIS — Z955 Presence of coronary angioplasty implant and graft: Secondary | ICD-10-CM | POA: Diagnosis not present

## 2016-03-22 DIAGNOSIS — E1140 Type 2 diabetes mellitus with diabetic neuropathy, unspecified: Secondary | ICD-10-CM | POA: Insufficient documentation

## 2016-03-22 DIAGNOSIS — M545 Low back pain: Secondary | ICD-10-CM | POA: Diagnosis not present

## 2016-03-22 DIAGNOSIS — K589 Irritable bowel syndrome without diarrhea: Secondary | ICD-10-CM | POA: Insufficient documentation

## 2016-03-22 DIAGNOSIS — Z79899 Other long term (current) drug therapy: Secondary | ICD-10-CM | POA: Insufficient documentation

## 2016-03-22 DIAGNOSIS — E1142 Type 2 diabetes mellitus with diabetic polyneuropathy: Secondary | ICD-10-CM | POA: Diagnosis not present

## 2016-03-22 DIAGNOSIS — I10 Essential (primary) hypertension: Secondary | ICD-10-CM | POA: Insufficient documentation

## 2016-03-22 DIAGNOSIS — G8929 Other chronic pain: Secondary | ICD-10-CM | POA: Diagnosis not present

## 2016-03-22 DIAGNOSIS — Z951 Presence of aortocoronary bypass graft: Secondary | ICD-10-CM | POA: Insufficient documentation

## 2016-03-22 DIAGNOSIS — I951 Orthostatic hypotension: Secondary | ICD-10-CM

## 2016-03-22 MED ORDER — AMITRIPTYLINE HCL 10 MG PO TABS: 10.0000 mg | ORAL_TABLET | Freq: Two times a day (BID) | ORAL | 2 refills | Status: DC

## 2016-03-22 NOTE — Patient Instructions (Signed)
PLEASE CALL ME WITH ANY PROBLEMS OR QUESTIONS ([phone])   HAPPY THANKSGIVING!!!!    

## 2016-03-22 NOTE — Progress Notes (Signed)
Subjective:    Patient ID: [name], male    DOB: [birth year], 82 y.o.   MRN: [id number]  HPI   This is an initial visit for [name] who is a 82 year old white male referred by Dr. [name] with a hx of CAD, DM. He presents today with an 8 month history of right chest wall pain. The patient underwent CABG x 4 in March of this year, and he reports the pain developed about a month after surgery. He did not undergo any post-op PT due to the fact that he's struggled with autonomic dysfunction and orthostasis. The pain is along the anterior ribs and radiates medially and posteriorly. The pain worsens with basic activity. He can move around a few minutes around the house before he has to sit down. Certain sitting positions, such as sitting up right, can exacerbate the pain too.  For pain relief, he has tried some aspercreme without relief. He's also has used ice/heat/TENS without relief. He was initially placed on gabapentin 300mg  TID and eventually 600mg  TID---this made him dizzy/sleepy. He currently is on 100mg  TID with the plan to taper off. About 2 months ago, he was started on elavil 10mg  TID which seems to have helped more than anything. Resting in his recliner seems to help too. He claims it doesn't bother him at night when he's still.   He was recently hospitalized for orthostasis last month. The orthostasis persists despite florinef and midodrine. He states that he's currenlty not symptomatic however.   I reviewed a chest xr from his October admission. There are no gross abn along the right chest wall.     Pain Inventory Average Pain 8 Pain Right Now 8 My pain is sharp and stabbing  In the last 24 hours, has pain interfered with the following? General activity 10 Relation with others 8 Enjoyment of life 10 What TIME of day is your pain at its worst? morning Sleep (in general) Fair  Pain is worse with: walking and standing Pain improves with: rest Relief from  Meds: 2  Mobility walk without assistance use a cane use a walker how many minutes can you walk? 10 ability to climb steps?  yes do you drive?  yes  Function retired I need assistance with the following:  bathing  Neuro/Psych weakness numbness trouble walking dizziness  Prior Studies Any changes since last visit?  no  Physicians involved in your care Primary care Ward Givens [facility] Cardiologist/ [place]   Family History  Problem Relation Age of Onset  . Heart attack Father     died age 40  . Heart attack Brother     died age 54  . Heart attack Sister     died age 15  . Colon cancer Sister   . Liver cancer Sister   . Diabetes Maternal Grandmother   . Colon polyps Sister     and brothers x 2    Social History   Social History  . Marital status: Married    Spouse name: N/A  . Number of children: 4   . Years of education: N/A   Occupational History  . Retired    Social History Main Topics  . Smoking status: Former Smoker    Quit date: 07/16/1963  . Smokeless tobacco: Never Used  . Alcohol use No  . Drug use: No  . Sexual activity: Not Currently   Other Topics Concern  . Not on file  Social History Narrative   1 caffeine drink daily    Past Surgical History:  Procedure Laterality Date  . BLADDER SURGERY  1969   traumatic pelvic fractures, urethral and bladder repair  . CARDIAC CATHETERIZATION N/A 07/01/2015   Procedure: Left Heart Cath and Coronary Angiography;  Surgeon: Wellington Hampshire, MD;  Location: [facility];  Service: Cardiovascular;  Laterality: N/A;  . CORONARY ANGIOPLASTY WITH STENT PLACEMENT    . CORONARY ARTERY BYPASS GRAFT N/A 07/06/2015   Procedure: CORONARY ARTERY BYPASS GRAFTING (CABG)x 4   utilizing the left internal mammary artery and endoscopically harvested bilateral  sapheneous vein.;  Surgeon: Ivin Poot, MD;  Location: [facility];  Service: Open Heart Surgery;  Laterality: N/A;  . KNEE SURGERY     . TEE WITHOUT CARDIOVERSION N/A 07/06/2015   Procedure: TRANSESOPHAGEAL ECHOCARDIOGRAM (TEE);  Surgeon: Ivin Poot, MD;  Location: [place];  Service: Open Heart Surgery;  Laterality: N/A;   Past Medical History:  Diagnosis Date  . 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 Colon polyps   . Coronary atherosclerosis of unspecified type of vessel, native or graft   . Diabetes mellitus    Diet control   . Difficult intubation   . Esophageal reflux   . Gastritis, chronic   . Hypertension   . IBS (irritable bowel syndrome)   . Orthostatic hypotension    "BP has been dropping alot when I stand up for the last month or so" (02/17/2016)  . Other and unspecified hyperlipidemia   . Personal history of other diseases of digestive system   . Vitamin B 12 deficiency   . Vitamin D deficiency    BP (!) 144/89 (BP Location: Left Arm, Cuff Size: Normal) Comment: recheck while standing  Pulse 97   Resp 14   SpO2 97%   Opioid Risk Score:   Fall Risk Score:  `1  Depression screen PHQ 2/9  Depression screen [facility] 2/9 03/22/2016 02/09/2016 10/29/2015 09/23/2015 08/11/2015 05/14/2015 03/31/2015  Decreased Interest 3 0 0 0 0 0 0  Down, Depressed, Hopeless 1 0 0 0 0 0 0  PHQ - 2 Score 4 0 0 0 0 0 0  Altered sleeping 2 - - - - - -  Tired, decreased energy 3 - - - - - -  Change in appetite 1 - - - - - -  Feeling bad or failure about yourself  2 - - - - - -  Trouble concentrating 2 - - - - - -  Moving slowly or fidgety/restless 1 - - - - - -  Suicidal thoughts 0 - - - - - -  PHQ-9 Score 15 - - - - - -  Difficult doing work/chores Extremely dIfficult - - - - - -   Review of Systems  Constitutional: Negative.   HENT: Negative.   Eyes: Negative.   Respiratory: Negative.   Cardiovascular:       Othostatic BPS  Gastrointestinal: Negative.   Endocrine:       High blood sugars  Genitourinary: Negative.   Musculoskeletal:       Rib pain on right side  Skin: Negative.   Neurological: Negative.     Hematological: Negative.   Psychiatric/Behavioral: Negative.   All other systems reviewed and are negative.      Objective:   Physical Exam   General: Alert and oriented x 3, No apparent distress HEENT: Head is normocephalic, atraumatic, PERRLA, EOMI, sclera anicteric, oral mucosa pink  and moist, dentition intact, ext ear canals clear,  Neck: Supple without JVD or lymphadenopathy Heart: Reg rate and rhythm. No murmurs rubs or gallops Chest: CTA bilaterally without wheezes, rales, or rhonchi; no distress Abdomen: Soft, non-tender, non-distended, bowel sounds positive. Extremities: No clubbing, cyanosis, or edema. Pulses are 2+ Skin: Clean and intact without signs of breakdown. CABG incision along sternum clean/intact Neuro: Pt is cognitively appropriate with normal insight, memory, and awareness. Cranial nerves 2-12 are intact.  Decreased sensation to LT in both feet, does seem to sense pain. Reflexes are 2+ in all 4's. Fine motor coordination is intact. No tremors. Motor function is grossly 5/5.  Musculoskeletal:  He has noticeable tenderness along the right 5-8th ribs. The pain appears to be most severe along ribs 6 and 7 at the right nipple line. Palpation caused the most pain at these areas and caused pain to radiate to the sternum and below the right scapula. Back exam is also noticeable for his a dextroscoliosis of around 5-8 decrease apex of which is at the L1-T12 area. There is associated spasm of the right paraspinals along the convex. He is tender with forward lumbar bending as well as bending to the right side. Gait is generally stable. Neck posture reasonable. Scapulae and shoulders appear symmetrical. Psych: Pt's affect is appropriate. Pt is cooperative        Assessment & Plan:  1. Chronic right chest wall pain, intercostal neuritis, ribs 6 and 7. Likely triggered by recent CABG in March  2. Chronic low back pain, thoracolumbar dextroscoliosis 3. Diabetic  polyneuropathy   Plan: 1. After informed consent and preparation of the skin with betadine and isopropyl alcohol, I injected 3mg  (1cc) of celestone and 2cc of 1% lidocaine along the inferior border of right ribs 6 and 7 at the nipple line  via anterolateral approach angling along the path of the rib. Additionally, aspiration was performed prior to injection. The patient tolerated well, and no complications were encountered. Afterward the area was cleaned and dressed. Post- injection instructions were provided. He was experiencing relief in his pain before leaving the office 2. For now, reduce elavil to 10mg  BID. No benefit in TID dosing. Might be prudent to consult with cardiology regarding long term use and the potential to titrate further given CV history. 3. Encouraged increased use of the upper extremity, more work to improve scapular and rib ROM---he and wife are set to join a gym. Needs to work on desensitizing the area 4. Reviewed some basics regarding his back. Will address further as we resolve his intercostal pain. For now he needs to focus on posture and ROM as above.  5. Follow up with me in about 6 weeks. Forty-five minutes of face to face patient care time were spent during this visit. All questions were encouraged and answered.

## 2016-03-28 ENCOUNTER — Other Ambulatory Visit: Payer: Self-pay | Admitting: Internal Medicine

## 2016-04-05 ENCOUNTER — Other Ambulatory Visit: Payer: Self-pay | Admitting: Internal Medicine

## 2016-04-07 ENCOUNTER — Encounter: Payer: Self-pay | Admitting: Internal Medicine

## 2016-04-07 ENCOUNTER — Ambulatory Visit (INDEPENDENT_AMBULATORY_CARE_PROVIDER_SITE_OTHER): Payer: Medicare Other | Admitting: Internal Medicine

## 2016-04-07 VITALS — BP 122/76 | HR 102 | Temp 98.2°F | Resp 16 | Ht 72.0 in | Wt 200.0 lb

## 2016-04-07 DIAGNOSIS — G903 Multi-system degeneration of the autonomic nervous system: Secondary | ICD-10-CM | POA: Diagnosis not present

## 2016-04-07 DIAGNOSIS — I951 Orthostatic hypotension: Secondary | ICD-10-CM

## 2016-04-07 NOTE — Progress Notes (Signed)
Assessment and Plan:    1. Dysautonomia orthostatic hypotension syndrome (HCC) -seems to be much improved -continue current medications routine -cont permissive supine HTN     HPI 82 y.o.male presents for 1 month follow up of dysautonomia.  He reports that he has been doing well.  His blood pressures standing are running around 90-110/70 when he is standing only.  He still has some mild dizziness.  He reports that he is walking around better and is also able to walk without the cane.  He fee. Patient reports that they have been doing well.  Past Medical History:  Diagnosis Date  . 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 Colon polyps   . Coronary atherosclerosis of unspecified type of vessel, native or graft   . Diabetes mellitus    Diet control   . Difficult intubation   . Esophageal reflux   . Gastritis, chronic   . Hypertension   . IBS (irritable bowel syndrome)   . Orthostatic hypotension    "BP has been dropping alot when I stand up for the last month or so" (02/17/2016)  . Other and unspecified hyperlipidemia   . Personal history of other diseases of digestive system   . Vitamin B 12 deficiency   . Vitamin D deficiency      Allergies  Allergen Reactions  . Cymbalta [Duloxetine Hcl]     Dizziness  . Keflex [Cephalexin] Other (See Comments)    Reaction: unknown   . Simvastatin Other (See Comments)    Reaction: unknown   . Sudafed [Pseudoephedrine]     Dizziness      Current Outpatient Prescriptions on File Prior to Visit  Medication Sig Dispense Refill  . aspirin EC 81 MG EC tablet Take 1 tablet (81 mg total) by mouth daily.    Marland Kitchen CALCIUM PO Take 1 tablet by mouth daily.    . cyanocobalamin 1000 MCG tablet Take 1,000 mcg by mouth daily.    . Flaxseed, Linseed, 1000 MG CAPS Take 1,300 mg by mouth daily.     . fludrocortisone (FLORINEF) 0.1 MG tablet Take 2 tablets (200 mcg total) by mouth daily. 60 tablet 3  . gabapentin (NEURONTIN) 100 MG capsule TAKE 1 CAPSULE(100  MG) BY MOUTH THREE TIMES DAILY 90 capsule 0  . Magnesium 250 MG TABS Take 500 mg by mouth daily.     . multivitamin (THERAGRAN) per tablet Take 1 tablet by mouth daily.      Marland Kitchen omeprazole (PRILOSEC) 20 MG capsule Take 20 mg by mouth at bedtime.      Current Facility-Administered Medications on File Prior to Visit  Medication Dose Route Frequency Provider Last Rate Last Dose  . midodrine (PROAMATINE) tablet 2.5 mg  2.5 mg Oral TID WC Luke K Kilroy, PA-C        ROS: all negative except above.   Physical Exam: Filed Weights   04/07/16 0851  Weight: 200 lb (90.7 kg)   BP 122/76 (Patient Position: Standing)   Pulse (!) 102   Temp 98.2 F (36.8 C) (Temporal)   Resp 16   Ht 6' (1.829 m)   Wt 200 lb (90.7 kg)   BMI 27.12 kg/m  General Appearance: Well developed well nourished, non-toxic appearing in no apparent distress. Eyes: PERRLA, EOMs, conjunctiva w/ no swelling or erythema or discharge Sinuses: No Frontal/maxillary tenderness ENT/Mouth: Ear canals clear without swelling or erythema.  TM's normal bilaterally with no retractions, bulging, or loss of landmarks.   Neck: Supple, thyroid  normal, no notable JVD  Respiratory: Respiratory effort normal, Clear breath sounds anteriorly and posteriorly bilaterally without rales, rhonchi, wheezing or stridor. No retractions or accessory muscle usage. Cardio: RRR with no MRGs.   Abdomen: Soft, + BS.  Non tender, no guarding, rebound, hernias, masses.  Musculoskeletal: Full ROM, 5/5 strength, normal gait.  Skin: Warm, dry without rashes  Neuro: Awake and oriented X 3, Cranial nerves intact. Normal muscle tone, no cerebellar symptoms. Sensation intact.  Psych: normal affect, Insight and Judgment appropriate.     Starlyn Skeans, PA-C 9:22 AM [facility],

## 2016-04-08 ENCOUNTER — Other Ambulatory Visit: Payer: Self-pay | Admitting: Physician Assistant

## 2016-04-18 ENCOUNTER — Ambulatory Visit (INDEPENDENT_AMBULATORY_CARE_PROVIDER_SITE_OTHER): Payer: Medicare Other | Admitting: Internal Medicine

## 2016-04-18 ENCOUNTER — Encounter: Payer: Self-pay | Admitting: Internal Medicine

## 2016-04-18 ENCOUNTER — Other Ambulatory Visit: Payer: Self-pay | Admitting: Internal Medicine

## 2016-04-18 VITALS — BP 146/82 | HR 100 | Temp 98.2°F | Resp 16 | Ht 72.0 in | Wt 200.0 lb

## 2016-04-18 DIAGNOSIS — G903 Multi-system degeneration of the autonomic nervous system: Secondary | ICD-10-CM | POA: Diagnosis not present

## 2016-04-18 DIAGNOSIS — J0140 Acute pansinusitis, unspecified: Secondary | ICD-10-CM

## 2016-04-18 DIAGNOSIS — I951 Orthostatic hypotension: Secondary | ICD-10-CM

## 2016-04-18 MED ORDER — PREDNISONE 20 MG PO TABS: ORAL_TABLET | ORAL | 0 refills | Status: DC

## 2016-04-18 MED ORDER — METOCLOPRAMIDE HCL 10 MG PO TABS: 10.0000 mg | ORAL_TABLET | Freq: Four times a day (QID) | ORAL | 0 refills | Status: DC | PRN

## 2016-04-18 MED ORDER — FLUTICASONE PROPIONATE 50 MCG/ACT NA SUSP: 2.0000 | Freq: Every day | NASAL | 0 refills | Status: DC

## 2016-04-18 MED ORDER — AZITHROMYCIN 250 MG PO TABS: ORAL_TABLET | ORAL | 0 refills | Status: DC

## 2016-04-18 NOTE — Progress Notes (Signed)
Subjective:    Patient ID: [name], male    DOB: June 13, 1933, 82 y.o.   MRN: [id number]  HPI  Patient presents to the office for evaluation of headache x 2 weeks in the occipital region and radiates towards the front of the head.  [name] reports that [name] has been taking sinex, [name] also tried cutting back to 1 tablet of florinef morning and evening.  [name] reports that helped a little bit.  [name] reports that in the mornings standing is okay.  [name] reports that when [name] is sitting [name] is 170/90-200/90.  [name] reports that [name] blood pressure varies.  No changes in diet.  NO extra stress levels.  [name] is denying double vision or blurry vision.  [name] reports that [name] does sometimes feel like [name] has a lightning strike in front of the eyes.  [name] has had headaches before but it has never lasted this long.  [name] has tried taking tylenol but it has not helped much.  It waxes and wanes but it has never gone away.  His headache is currently a 5/10, at its worst it was a 9/10.  [name] reports no nausea, no vomiting.  [name] has not had any other weakness.    Review of Systems  Constitutional: Negative for chills, fatigue and fever.  HENT: Positive for congestion, postnasal drip, sinus pain and sinus pressure. Negative for ear pain, nosebleeds, sore throat, trouble swallowing and voice change.   Eyes: Positive for photophobia. Negative for visual disturbance.  Respiratory: Negative for chest tightness, shortness of breath and wheezing.   Cardiovascular: Negative for chest pain and palpitations.  Gastrointestinal: Negative for nausea and vomiting.  Neurological: Positive for light-headedness and headaches. Negative for dizziness, speech difficulty, weakness and numbness.  Psychiatric/Behavioral: Negative for confusion and dysphoric mood. The patient is not nervous/anxious.        Objective:   Physical Exam  Constitutional: [name] is oriented to person, place, and time. [name] appears well-developed and well-nourished. No distress.  HENT:   Head: Normocephalic and atraumatic.  Right Ear: A middle ear effusion is present.  Left Ear: A middle ear effusion is present.  Nose: Mucosal edema present. Right sinus exhibits maxillary sinus tenderness and frontal sinus tenderness. Left sinus exhibits maxillary sinus tenderness and frontal sinus tenderness.  Mouth/Throat: Uvula is midline and oropharynx is clear and moist. No oral lesions. No trismus in the jaw. No oropharyngeal exudate.  Neck: Normal range of motion. Neck supple. No Brudzinski's sign and no Kernig's sign noted.  Cardiovascular: Normal rate, regular rhythm and intact distal pulses.  Exam reveals no gallop and no friction rub.   Murmur heard. Pulmonary/Chest: Effort normal and breath sounds normal. No respiratory distress. [name] has no wheezes. [name] has no rales. [name] exhibits no tenderness.  Musculoskeletal: Normal range of motion.  Neurological: [name] is alert and oriented to person, place, and time. [name] has normal strength. No cranial nerve deficit or sensory deficit. Coordination normal. GCS eye subscore is 4. GCS verbal subscore is 5. GCS motor subscore is 6.  Slow gait with a cane.  Unchanged from perivous visit.  Normal rapid alternating hand movements, normal heel to shin, normal sensation to light touch.  Normal cranial nerve testing.    Skin: [name] is not diaphoretic.  Vitals reviewed.   Vitals:   04/18/16 1556  BP: (!) 146/82  Pulse: 100  Resp: 16  Temp: 98.2 F (36.8 C)         Assessment & Plan:  1. Dysautonomia orthostatic hypotension syndrome (HCC) -feel that permissive seated and supine hypertension is contributing to this headache.  No red flags or acute neuro deficits but patient warned that if acute changes from baseline, if weakness, numbness, vision changes or any other concerning signs patient to go to the ER. -cut morning dose of florinef to 1/2 tablet.   Keep evening dose at 1 tablet -goal seated pressure is 160/80 -Standing pressure goal is  110-120/60-70  2. Acute non-recurrent pansinusitis -prednisone -zpak -nasal saline -continue daily antihistamine -flonase -reglan as needed for headache -avoid NSAIDs due to permissive HTN -tylenol as needed

## 2016-04-18 NOTE — Patient Instructions (Signed)
Please start taking the prednisone until it is gone.  Take the zpak until it is gone.  Please take the tylenol 1,000 mg three times daily for headache.  Please use flonase 2 sprays per nostril.  You can use vaseline in your nose to help moisturize it.  Take 50 mg of benadryl at bedtime to help with congestion.  Please continue to take claritin zyrtec or allegra.   Please cut the morning dose of florinef to 1/2 and keep evening tablet at a whole.  Monitor both sitting and standing blood pressures.  Goal for sitting is between 150-160/60-80.  Please let me know how you are doing.  You can take reglan every 8 hours as needed with the tylenol to help with headaches.

## 2016-04-20 ENCOUNTER — Telehealth: Payer: Self-pay | Admitting: *Deleted

## 2016-04-20 NOTE — Telephone Encounter (Signed)
Patient called stating his headaches are gone, however, BP readings were 188/111 standing and 190/110 sitting.  Per Starlyn Skeans, PA-C orders, I spoke with patient and advised him to change Florinef Rx to 0.5 mg daily and to call in 1-2 days with a BP list.  Advised him to call before then if any increase in BP or any other concerns.

## 2016-05-03 ENCOUNTER — Telehealth: Payer: Self-pay | Admitting: *Deleted

## 2016-05-03 ENCOUNTER — Encounter: Payer: Medicare Other | Attending: Physical Medicine & Rehabilitation | Admitting: Physical Medicine & Rehabilitation

## 2016-05-03 ENCOUNTER — Encounter: Payer: Self-pay | Admitting: Physical Medicine & Rehabilitation

## 2016-05-03 VITALS — BP 167/98 | HR 88 | Resp 14

## 2016-05-03 DIAGNOSIS — Z79899 Other long term (current) drug therapy: Secondary | ICD-10-CM | POA: Diagnosis not present

## 2016-05-03 DIAGNOSIS — N183 Chronic kidney disease, stage 3 unspecified: Secondary | ICD-10-CM

## 2016-05-03 DIAGNOSIS — M4105 Infantile idiopathic scoliosis, thoracolumbar region: Secondary | ICD-10-CM

## 2016-05-03 DIAGNOSIS — M4185 Other forms of scoliosis, thoracolumbar region: Secondary | ICD-10-CM | POA: Insufficient documentation

## 2016-05-03 DIAGNOSIS — Z955 Presence of coronary angioplasty implant and graft: Secondary | ICD-10-CM | POA: Insufficient documentation

## 2016-05-03 DIAGNOSIS — Z951 Presence of aortocoronary bypass graft: Secondary | ICD-10-CM | POA: Diagnosis not present

## 2016-05-03 DIAGNOSIS — K219 Gastro-esophageal reflux disease without esophagitis: Secondary | ICD-10-CM | POA: Diagnosis not present

## 2016-05-03 DIAGNOSIS — Z87891 Personal history of nicotine dependence: Secondary | ICD-10-CM | POA: Insufficient documentation

## 2016-05-03 DIAGNOSIS — Z8249 Family history of ischemic heart disease and other diseases of the circulatory system: Secondary | ICD-10-CM | POA: Insufficient documentation

## 2016-05-03 DIAGNOSIS — E559 Vitamin D deficiency, unspecified: Secondary | ICD-10-CM | POA: Diagnosis not present

## 2016-05-03 DIAGNOSIS — E785 Hyperlipidemia, unspecified: Secondary | ICD-10-CM | POA: Diagnosis not present

## 2016-05-03 DIAGNOSIS — I2510 Atherosclerotic heart disease of native coronary artery without angina pectoris: Secondary | ICD-10-CM | POA: Diagnosis not present

## 2016-05-03 DIAGNOSIS — G588 Other specified mononeuropathies: Secondary | ICD-10-CM | POA: Diagnosis not present

## 2016-05-03 DIAGNOSIS — R0789 Other chest pain: Secondary | ICD-10-CM | POA: Insufficient documentation

## 2016-05-03 DIAGNOSIS — K589 Irritable bowel syndrome without diarrhea: Secondary | ICD-10-CM | POA: Insufficient documentation

## 2016-05-03 DIAGNOSIS — G8929 Other chronic pain: Secondary | ICD-10-CM | POA: Insufficient documentation

## 2016-05-03 DIAGNOSIS — M545 Low back pain: Secondary | ICD-10-CM | POA: Diagnosis not present

## 2016-05-03 DIAGNOSIS — E1142 Type 2 diabetes mellitus with diabetic polyneuropathy: Secondary | ICD-10-CM | POA: Diagnosis not present

## 2016-05-03 DIAGNOSIS — I10 Essential (primary) hypertension: Secondary | ICD-10-CM | POA: Insufficient documentation

## 2016-05-03 MED ORDER — DICLOFENAC SODIUM 1 % TD GEL: 1.0000 "application " | Freq: Three times a day (TID) | TRANSDERMAL | 4 refills | Status: DC

## 2016-05-03 MED ORDER — LIDOCAINE 5 % EX OINT: 1.0000 "application " | TOPICAL_OINTMENT | CUTANEOUS | 0 refills | Status: DC | PRN

## 2016-05-03 NOTE — Progress Notes (Signed)
Subjective:    Patient ID: [name], male    DOB: 1934-01-13, 83 y.o.   MRN: [id number]  HPI   [name] is here in follow up of his right rib pain. He had good result with the injection we performed at last visit, but now is having more pain in his mid to low back which has been chronic. His anterior/lateral chest wall pain is much improved. His mid to low back bothers him when he's up walking or if he stands or sits too long. He has been seen by Dr. Ellene Route in the past for his back. We documented his scoliosis at last visit as well. His wife rubs aspercreme on the back which does help on occasion.   He is currently off elavil and on gabapentin per Dr. Melford Aase.   He has had ongoing struggles with orthostasis/hypotension although only feels slightly dizzy at this point. He is currently on florinef 0.2mg  daily      Pain Inventory Average Pain 6 Pain Right Now 4 My pain is aching  In the last 24 hours, has pain interfered with the following? General activity 7 Relation with others 4 Enjoyment of life 4 What TIME of day is your pain at its worst? night Sleep (in general) Fair  Pain is worse with: standing and some activites Pain improves with: rest and medication Relief from Meds: 0  Mobility walk with assistance use a cane how many minutes can you walk? 10 ability to climb steps?  yes do you drive?  yes Do you have any goals in this area?  yes  Function retired  Neuro/Psych weakness dizziness  Prior Studies Any changes since last visit?  no  Physicians involved in your care Any changes since last visit?  no   Family History  Problem Relation Age of Onset  . Heart attack Father     died age 88  . Heart attack Brother     died age 7  . Heart attack Sister     died age 98  . Colon cancer Sister   . Liver cancer Sister   . Diabetes Maternal Grandmother   . Colon polyps Sister     and brothers x 2    Social History   Social History  .  Marital status: Married    Spouse name: N/A  . Number of children: 4   . Years of education: N/A   Occupational History  . Retired    Social History Main Topics  . Smoking status: Former Smoker    Quit date: 07/16/1963  . Smokeless tobacco: Never Used  . Alcohol use No  . Drug use: No  . Sexual activity: Not Currently   Other Topics Concern  . None   Social History Narrative   1 caffeine drink daily    Past Surgical History:  Procedure Laterality Date  . BLADDER SURGERY  1969   traumatic pelvic fractures, urethral and bladder repair  . CARDIAC CATHETERIZATION N/A 07/01/2015   Procedure: Left Heart Cath and Coronary Angiography;  Surgeon: Wellington Hampshire, MD;  Location: [facility];  Service: Cardiovascular;  Laterality: N/A;  . CORONARY ANGIOPLASTY WITH STENT PLACEMENT    . CORONARY ARTERY BYPASS GRAFT N/A 07/06/2015   Procedure: CORONARY ARTERY BYPASS GRAFTING (CABG)x 4   utilizing the left internal mammary artery and endoscopically harvested bilateral  sapheneous vein.;  Surgeon: Ivin Poot, MD;  Location: [facility];  Service: Open Heart Surgery;  Laterality: N/A;  .  KNEE SURGERY    . TEE WITHOUT CARDIOVERSION N/A 07/06/2015   Procedure: TRANSESOPHAGEAL ECHOCARDIOGRAM (TEE);  Surgeon: Ivin Poot, MD;  Location: [facility];  Service: Open Heart Surgery;  Laterality: N/A;   Past Medical History:  Diagnosis Date  . 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 Colon polyps   . Coronary atherosclerosis of unspecified type of vessel, native or graft   . Diabetes mellitus    Diet control   . Difficult intubation   . Esophageal reflux   . Gastritis, chronic   . Hypertension   . IBS (irritable bowel syndrome)   . Orthostatic hypotension    "BP has been dropping alot when I stand up for the last month or so" (02/17/2016)  . Other and unspecified hyperlipidemia   . Personal history of other diseases of digestive system   . Vitamin B 12 deficiency   . Vitamin D deficiency    BP  (!) 167/98   Pulse 88   Resp 14   SpO2 96%   Opioid Risk Score:   Fall Risk Score:  `1  Depression screen PHQ 2/9  Depression screen [facility] 2/9 03/22/2016 02/09/2016 10/29/2015 09/23/2015 08/11/2015 05/14/2015 03/31/2015  Decreased Interest 3 0 0 0 0 0 0  Down, Depressed, Hopeless 1 0 0 0 0 0 0  PHQ - 2 Score 4 0 0 0 0 0 0  Altered sleeping 2 - - - - - -  Tired, decreased energy 3 - - - - - -  Change in appetite 1 - - - - - -  Feeling bad or failure about yourself  2 - - - - - -  Trouble concentrating 2 - - - - - -  Moving slowly or fidgety/restless 1 - - - - - -  Suicidal thoughts 0 - - - - - -  PHQ-9 Score 15 - - - - - -  Difficult doing work/chores Extremely dIfficult - - - - - -    Review of Systems  HENT: Negative.   Eyes: Negative.   Respiratory: Negative.   Gastrointestinal: Positive for constipation.  Endocrine: Negative.   Genitourinary: Negative.   Musculoskeletal: Positive for back pain and gait problem.  Skin: Negative.   Neurological: Positive for dizziness and weakness.  Psychiatric/Behavioral: Negative.   All other systems reviewed and are negative.      Objective:   Physical Exam  General: Alert and oriented x 3, No apparent distress HEENT: Head is normocephalic, atraumatic, PERRLA, EOMI, sclera anicteric, oral mucosa pink and moist, dentition intact, ext ear canals clear,  Neck: Supple without JVD or lymphadenopathy Heart: RRR Chest: clear with no distress Abdomen: Soft, non-tender, non-distended, bowel sounds positive. Extremities: No clubbing, cyanosis, or edema. Pulses are 2+ Skin: Clean and intact without signs of breakdown. CABG incision along sternum clean/intact Neuro: Pt is cognitively appropriate with normal insight, memory, and awareness. Cranial nerves 2-12 are intact.  Decreased sensation to LT in both feet, does seem to sense pain. Reflexes are 2+ in all 4's. Fine motor coordination is intact. No tremors. Motor function is grossly 5/5.    Musculoskeletal:  Right rib tenderness much improved.  (at 6 and 7). Back exam is also noticeable for his a dextroscoliosis of around 5-8 decrease apex of which is at the L1-2 area and levoscoliosis of the mid thoracic spine. There is associated spasm of the right paraspinals along the convex of the lumbar curve. He is tender with forward lumbar bending as well  as bending to the right side. Gait is stable with cane. Neck posture fairly appropriate.   Psych: Pt's affect is appropriate. Pt is cooperative and pleasant        Assessment & Plan:  1. Chronic right chest wall pain, intercostal neuritis, ribs 6 and 7. Likely triggered by recent CABG in March  2. Chronic low back pain, thoracic levoscoliosis and lumbar dextroscoliosis. This is more of a problem now. 3. Diabetic polyneuropathy with ?autonomic dysfunction, hypotension   Plan: 1. Will hold on further injection at this time, given good results with last injection.  2. Gabapentin 100mg  tid per primary. 3. Voltaren gel and lidocaine gel to back/ribs was rx'ed today.  4. Will refer to outpt PT at [facility] for ROM, strength and to develop a HEP for his back.  Close observation for orthostasis/hypotension with therapy.  5. Follow up with me in about 2 months. 15 minutes of face to face patient care time were spent during this visit. All questions were encouraged and answered.

## 2016-05-03 NOTE — Patient Instructions (Signed)
PLEASE CALL ME WITH ANY PROBLEMS OR QUESTIONS ([phone])  

## 2016-05-03 NOTE — Telephone Encounter (Signed)
Prior authorization submitted for Diclofenac sodium gel 1%

## 2016-05-05 ENCOUNTER — Encounter: Payer: Self-pay | Admitting: Physical Therapy

## 2016-05-05 ENCOUNTER — Ambulatory Visit: Payer: Medicare Other | Attending: Physical Medicine & Rehabilitation | Admitting: Physical Therapy

## 2016-05-05 DIAGNOSIS — M545 Low back pain, unspecified: Secondary | ICD-10-CM

## 2016-05-05 DIAGNOSIS — R2689 Other abnormalities of gait and mobility: Secondary | ICD-10-CM | POA: Insufficient documentation

## 2016-05-05 DIAGNOSIS — M62830 Muscle spasm of back: Secondary | ICD-10-CM | POA: Insufficient documentation

## 2016-05-05 DIAGNOSIS — M6281 Muscle weakness (generalized): Secondary | ICD-10-CM | POA: Diagnosis not present

## 2016-05-05 DIAGNOSIS — G8929 Other chronic pain: Secondary | ICD-10-CM | POA: Diagnosis not present

## 2016-05-05 DIAGNOSIS — M546 Pain in thoracic spine: Secondary | ICD-10-CM | POA: Diagnosis not present

## 2016-05-05 NOTE — Therapy (Signed)
[facility] [address] [place], [place], [address] Phone: [phone]   Fax:  [phone]  Physical Therapy Evaluation  Patient Details  Name: [name] MRN: [id number] Date of Birth: April 15, 1934 Referring Provider: Dr. Meredith Staggers  Encounter Date: 05/05/2016      PT End of Session - 05/05/16 1151    Visit Number 1   Number of Visits 10   Date for PT Re-Evaluation 06/30/16   Authorization Type g-code on 10th visit   PT Start Time 1100   PT Stop Time 1145   PT Time Calculation (min) 45 min   Activity Tolerance Patient tolerated treatment well   Behavior During Therapy [facility] for tasks assessed/performed      Past Medical History:  Diagnosis Date  . 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 Colon polyps   . Coronary atherosclerosis of unspecified type of vessel, native or graft   . Diabetes mellitus    Diet control   . Difficult intubation   . Esophageal reflux   . Gastritis, chronic   . Hypertension   . IBS (irritable bowel syndrome)   . Orthostatic hypotension    "BP has been dropping alot when I stand up for the last month or so" (02/17/2016)  . Other and unspecified hyperlipidemia   . Personal history of other diseases of digestive system   . Vitamin B 12 deficiency   . Vitamin D deficiency     Past Surgical History:  Procedure Laterality Date  . BLADDER SURGERY  1969   traumatic pelvic fractures, urethral and bladder repair  . CARDIAC CATHETERIZATION N/A 07/01/2015   Procedure: Left Heart Cath and Coronary Angiography;  Surgeon: Wellington Hampshire, MD;  Location: [place] [facility];  Service: Cardiovascular;  Laterality: N/A;  . CORONARY ANGIOPLASTY WITH STENT PLACEMENT    . CORONARY ARTERY BYPASS GRAFT N/A 07/06/2015   Procedure: CORONARY ARTERY BYPASS GRAFTING (CABG)x 4   utilizing the left internal mammary artery and endoscopically harvested bilateral  sapheneous vein.;  Surgeon: Ivin Poot, MD;  Location: [place];  Service: Open Heart Surgery;  Laterality: N/A;  . KNEE SURGERY    . TEE WITHOUT CARDIOVERSION N/A 07/06/2015   Procedure: TRANSESOPHAGEAL ECHOCARDIOGRAM (TEE);  Surgeon: Ivin Poot, MD;  Location: [place];  Service: Open Heart Surgery;  Laterality: N/A;    There were no vitals filed for this visit.       Subjective Assessment - 05/05/16 1105    Subjective Since cardiac surgery , patient has had back pain.  Patient reports he has been dizzy since the surgery due to blood pressure being low. Patient has been hospital since heart surgery due to falls, being dizzy and blood pressure dopps.    Pertinent History dizzy; sometimes legs give way   Limitations Standing   Patient Stated Goals reduce back pain, increase strength for prior function, walking 2 miles per day,    Currently in Pain? 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 Meredith Staggers   Onset Date/Surgical Date 07/06/15   Prior Therapy None due to dizziness     Precautions   Precautions Other (comment)   Precaution Comments dizzy     Restrictions   Weight Bearing Restrictions No     Balance Screen   Has the patient fallen in the past 6 months Yes   How many times? 1  due to being dizzy getting up to go to the bathroom   Has the patient had a decrease in activity level because of a fear of falling?  No   Is  the patient reluctant to leave their home because of a fear of falling?  No     Home Environment   Living Environment Private residence   Living Arrangements Spouse/significant other   Type of [place] to enter   Entrance Stairs-Number of Steps [place] One level     Prior Function   Level of Independence Independent with basic ADLs   Vocation Retired   Leisure walk 2 miles     [employer]   Overall Cognitive Status Within Functional Limits for tasks assessed     Observation/Other Assessments   Observations scar from cardiac by-pass surgery has decreased mobility   Focus on Therapeutic Outcomes (FOTO)  53% limitation  43% limitation     Posture/Postural Control   Posture/Postural Control Postural limitations   Postural Limitations Forward head;Rounded Shoulders;Decreased lumbar lordosis;Posterior pelvic tilt;Flexed trunk     ROM / Strength   AROM / PROM / Strength AROM;Strength     AROM   Lumbar Flexion full   Lumbar Extension 75% limited   Lumbar - Right Side Bend 50% limited  pain   Lumbar - Left Side Bend 25% limited     Strength   Right Hip ABduction 4+/5   Left Hip ABduction 3+/5     Palpation   Palpation comment palpable tenderness located in right side of lumbar, right side of T3-T6, and right diaphgram;  decreased mobility of right lower rib cage for expansion     Ambulation/Gait   Ambulation/Gait Yes   Assistive device Straight cane   Gait Pattern Trunk flexed;Wide base of support  decreased bil. hip extension, may go to the left     Standardized Balance Assessment   Standardized Balance Assessment Timed Up and Go Test   Five times sit to stand comments  22 sec     Timed Up and Go Test   TUG Normal TUG   Normal TUG (seconds) 14                           PT Education - 05/05/16 1249    Education provided No          PT Short Term Goals - 05/05/16 1235      PT  SHORT TERM GOAL #1   Title independent with initial HEP   Time 4   Period Weeks   Status New     PT SHORT TERM GOAL #2   Title understand correct body mechanics with ADL's to reduce strain on his back   Time 4   Period Weeks   Status New  PT SHORT TERM GOAL #3   Title understand tips to reduce falls due to him being dizzy since his by-pass surgery   Time 4   Period Weeks   Status New     PT SHORT TERM GOAL #4   Title back pain with walking and standing decreased >/= 25% due to standing up straighter and increased trunk strength   Time 4   Period Weeks   Status New           PT Long Term Goals - 05/05/16 1137      PT LONG TERM GOAL #1   Title independent with HEP   Time 8   Period Weeks   Status New     PT LONG TERM GOAL #2   Title walk 1 mile at the park with 2-3 rests with SPC due to back pain decreased >/= 50%   Time 8   Period Weeks   Status New     PT LONG TERM GOAL #3   Title trunk extensor strength has improved so patient is able to walk in the community with upright posture and no verbal cues from wife   Time 8   Period Weeks   Status New     PT LONG TERM GOAL #4   Title sit to stand </= 14 sec due to improved balance   Time 8   Period Weeks   Status New     PT LONG TERM GOAL #5   Title get up from the floor after working on the car with >/= 50% greater ease due to increase strength and mobility   Time 8   Period Weeks   Status New     Additional Long Term Goals   Additional Long Term Goals Yes     PT LONG TERM GOAL #6   Title FOTO score is </= 43% limitation   Time 8   Period Weeks   Status New               Plan - 05/05/16 1152    Clinical Impression Statement Patient is a 83 year old male with back pain since he had cardiac by-pass surgery on 07/06/2015.  Patient reports his intermittent back pain is at level 8/10 with walking, standing, and bending over to work on his cars. Patient reports he has been dizzy since his surgery  but has improve some.  He reports doctors are not sure what is causing the dizzyness but his blood pressure gets low and could be a factor. Patient has fallen 1 time in the past 6 months due to his blood pressure.  Patient ambulates with a single point cane with a flexed posture, decreased bilateral hip extension and will deviate to the left at times.  Timed up and go with single point cane is 14 seconds. Sit to stand 5 times in 22 seconds.  Patient stands with forward head, rounded shoulders, reduced lumbar lordosis, and has scoliosis.  Scar on chest from surgery is limited.  Palpable tenderness located in right side of L3-L5, right side of T5-T6, and right diaphgram.  Patient has decreased mobility of right lower rib cage with inhalation.  Patient has decreased trunk extensors due to difficulty keeping upright posture. Patient is only able to walk 180 feet at this time.  Patient is moderately complex evaluation due to evolving condition of back pain and comorbidities such as being dizzy, s/p cardiac by-pass surgery, and diabetic neuropathy. Patient will benefit from skilled therapy to  reduce back pain, improve balance and improve function.    Rehab Potential Good   Clinical Impairments Affecting Rehab Potential Heat bypass surgery 07/06/2015 and has been dizzy since then   PT Frequency 2x / week   PT Duration 8 weeks   PT Treatment/Interventions Cryotherapy;Electrical Stimulation;Gait training;Ultrasound;Moist Heat;Therapeutic activities;Therapeutic exercise;Balance training;Neuromuscular re-education;Patient/family education;Passive range of motion;Scar mobilization;Manual techniques;Energy conservation   PT Next Visit Plan modalities for pain relief; right rib mobilization to expand; soft tissue work to right diaphgram, lumbar paraspinal, and right side of T3-T6   PT Home Exercise Plan balance exercises; body mechanics with home tasks   Recommended Other Services None   Consulted and Agree with Plan of  Care Patient;Family member/caregiver   Family Member Consulted wife      Patient will benefit from skilled therapeutic intervention in order to improve the following deficits and impairments:  Abnormal gait, Decreased range of motion, Difficulty walking, Increased fascial restricitons, Decreased endurance, Increased muscle spasms, Dizziness, Decreased activity tolerance, Pain, Decreased balance, Impaired flexibility, Decreased strength, Decreased mobility  Visit Diagnosis: Muscle weakness (generalized) - Plan: PT plan of care cert/re-cert  Other abnormalities of gait and mobility - Plan: PT plan of care cert/re-cert  Pain in thoracic spine - Plan: PT plan of care cert/re-cert  Chronic right-sided low back pain without sciatica - Plan: PT plan of care cert/re-cert  Muscle spasm of back - Plan: PT plan of care cert/re-cert      G-Codes - [id number]    Functional Assessment Tool Used FOTO score is 53% limitation  goal is 43% limitation   Functional Limitation Mobility: Walking and moving around   Mobility: Walking and Moving Around Current Status [phone]) At least 40 percent but less than 60 percent impaired, limited or restricted   Mobility: Walking and Moving Around Goal Status [phone]) At least 40 percent but less than 60 percent impaired, limited or restricted       Problem List Patient Active Problem List   Diagnosis Date Noted  . Idiopathic scoliosis 03/22/2016  . Diabetic neuropathy ([place]) 03/22/2016  . Dysautonomia orthostatic hypotension syndrome ([place]) 02/25/2016  . CKD (chronic kidney disease), stage III 02/19/2016  . Neuropathic pain of foot 02/18/2016  . Coronary artery disease involving coronary bypass graft of native heart without angina pectoris   . Diabetes mellitus type 2, diet-controlled ([place])   . 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 Medication management 08/11/2015  . Orthostatic hypotension 07/24/2015  . 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 NSTEMI (non-ST elevated  myocardial infarction) ([place]) 07/01/2015  . BMI 25.0-25.9,adult 02/02/2015  . PVD (peripheral vascular disease) ([place]) 01/01/2014  . Mixed hyperlipidemia 04/11/2013  . Prediabetes 04/11/2013  . Supine hypertension   . Vitamin D deficiency   . Vitamin B 12 deficiency   . Coronary atherosclerosis- s/p PCI to LAD in 2009 and PCI to RCA in 2011 02/27/2009  . Abdominal aortic aneurysm ([place]) 02/27/2009  . GERD 02/27/2009  . History of IBS 02/27/2009    Earlie Counts, PT 05/05/16 12:51 PM     [facility] [address], [place], [place], [address] Phone: [phone]   Fax:  [phone]  Name: CAULIN DALOMBA MRN: [id number] Date of Birth: Jul 25, 1933

## 2016-05-10 ENCOUNTER — Ambulatory Visit: Payer: Medicare Other | Admitting: Physical Therapy

## 2016-05-10 ENCOUNTER — Encounter: Payer: Self-pay | Admitting: Physical Therapy

## 2016-05-10 DIAGNOSIS — M545 Low back pain, unspecified: Secondary | ICD-10-CM

## 2016-05-10 DIAGNOSIS — M546 Pain in thoracic spine: Secondary | ICD-10-CM | POA: Diagnosis not present

## 2016-05-10 DIAGNOSIS — M6281 Muscle weakness (generalized): Secondary | ICD-10-CM

## 2016-05-10 DIAGNOSIS — R2689 Other abnormalities of gait and mobility: Secondary | ICD-10-CM

## 2016-05-10 DIAGNOSIS — M62830 Muscle spasm of back: Secondary | ICD-10-CM

## 2016-05-10 DIAGNOSIS — G8929 Other chronic pain: Secondary | ICD-10-CM | POA: Diagnosis not present

## 2016-05-10 NOTE — Therapy (Signed)
[facility] [address] [place], [place], [address] Phone: [phone]   Fax:  [phone]  Physical Therapy Treatment  Patient Details  Name: [name] MRN: [id number] Date of Birth: Aug 07, 1933 Referring Provider: Dr. Meredith Staggers  Encounter Date: 05/10/2016      PT End of Session - 05/10/16 0932    Visit Number 2   Number of Visits 10   Date for PT Re-Evaluation 06/30/16   Authorization Type g-code on 10th visit   PT Start Time 0930   PT Stop Time 1011   PT Time Calculation (min) 41 min   Activity Tolerance Patient tolerated treatment well   Behavior During Therapy [facility] for tasks assessed/performed      Past Medical History:  Diagnosis Date  . 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 Colon polyps   . Coronary atherosclerosis of unspecified type of vessel, native or graft   . Diabetes mellitus    Diet control   . Difficult intubation   . Esophageal reflux   . Gastritis, chronic   . Hypertension   . IBS (irritable bowel syndrome)   . Orthostatic hypotension    "BP has been dropping alot when I stand up for the last month or so" (02/17/2016)  . Other and unspecified hyperlipidemia   . Personal history of other diseases of digestive system   . Vitamin B 12 deficiency   . Vitamin D deficiency     Past Surgical History:  Procedure Laterality Date  . BLADDER SURGERY  1969   traumatic pelvic fractures, urethral and bladder repair  . CARDIAC CATHETERIZATION N/A 07/01/2015   Procedure: Left Heart Cath and Coronary Angiography;  Surgeon: Wellington Hampshire, MD;  Location: [place] [facility];  Service: Cardiovascular;  Laterality: N/A;  . CORONARY ANGIOPLASTY WITH STENT PLACEMENT    . CORONARY ARTERY BYPASS GRAFT N/A 07/06/2015   Procedure: CORONARY ARTERY BYPASS GRAFTING (CABG)x 4   utilizing the left internal mammary artery and endoscopically harvested bilateral  sapheneous vein.;  Surgeon: Ivin Poot,  MD;  Location: [place];  Service: Open Heart Surgery;  Laterality: N/A;  . KNEE SURGERY    . TEE WITHOUT CARDIOVERSION N/A 07/06/2015   Procedure: TRANSESOPHAGEAL ECHOCARDIOGRAM (TEE);  Surgeon: Ivin Poot, MD;  Location: [place];  Service: Open Heart Surgery;  Laterality: N/A;    There were no vitals filed for this visit.      Subjective Assessment - 05/10/16 0930    Subjective Pain right now not so bad. Most pain upon getting up in the morning or walking a long time. 9/10 pain upon getting out of bed.    Pertinent History dizzy; sometimes legs give way   Limitations Standing   Patient Stated Goals reduce back pain, increase strength for prior function, walking 2 miles per day,    Currently in Pain? Yes   Pain Score 2    Pain Location Back   Pain Orientation Lower   Pain Descriptors / Indicators Aching   Pain Type Chronic pain   Pain Radiating Towards None   Pain Frequency Intermittent                         [facility] Adult PT Treatment/Exercise - 05/10/16 0001      Exercises   Exercises Lumbar;Shoulder     Lumbar Exercises: Standing   Row Strengthening;Both;20 reps;Theraband   Theraband Level (Row) Level 3 (Green)  Shoulder Extension Strengthening;Both;20 reps;Theraband   Theraband Level (Shoulder Extension) Level 3 (Green)   Other Standing Lumbar Exercises Hip extension/ abduction  2x10   Other Standing Lumbar Exercises Hamstring curls  2x10     Shoulder Exercises: Stretch   Other Shoulder Stretches Thoracic extension over towel  Seated x10     Manual Therapy   Manual Therapy Soft tissue mobilization   Manual therapy comments Pt supine   Soft tissue mobilization Scar mobilization  large scar on anterior chest and two smaller scares below                 PT Education - 05/10/16 1007    Education provided Yes   Education Details Scar mobilization and shoulder strength   Person(s) Educated Patient   Methods  Explanation;Demonstration;Handout   Comprehension Verbalized understanding          PT Short Term Goals - 05/10/16 0932      PT SHORT TERM GOAL #1   Title independent with initial HEP   Time 4   Period Weeks   Status On-going     PT SHORT TERM GOAL #2   Title understand correct body mechanics with ADL's to reduce strain on his back   Time 4   Period Weeks   Status On-going     PT SHORT TERM GOAL #3   Title understand tips to reduce falls due to him being dizzy since his by-pass surgery   Time 4   Period Weeks   Status On-going     PT SHORT TERM GOAL #4   Title back pain with walking and standing decreased >/= 25% due to standing up straighter and increased trunk strength   Time 4   Period Weeks   Status On-going           PT Long Term Goals - 05/10/16 0932      PT LONG TERM GOAL #1   Title independent with HEP   Time 8   Period Weeks   Status On-going     PT LONG TERM GOAL #2   Title walk 1 mile at the park with 2-3 rests with SPC due to back pain decreased >/= 50%   Time 8   Period Weeks   Status On-going     PT LONG TERM GOAL #3   Title trunk extensor strength has improved so patient is able to walk in the community with upright posture and no verbal cues from wife   Time 8   Period Weeks   Status On-going     PT LONG TERM GOAL #4   Title sit to stand </= 14 sec due to improved balance   Time 8   Period Weeks   Status On-going     PT LONG TERM GOAL #5   Title get up from the floor after working on the car with >/= 50% greater ease due to increase strength and mobility   Time 8   Period Weeks   Status On-going     PT LONG TERM GOAL #6   Title FOTO score is </= 43% limitation   Time 8   Period Weeks   Status On-going               Plan - 05/10/16 1026    Clinical Impression Statement Pt presents with forward flexed posture and lean with gait. Pt able to tolerate all exercises and stretches well. Pt has large scar on anterior chest  limiting chest ROM  for breathing and may be limiting torso extension with posture. Pt educated on importance of scar flexibility and how to perform scar massage at home.    Rehab Potential Good   Clinical Impairments Affecting Rehab Potential Heart bypass surgery 07/06/2015 and has been dizzy since then   PT Frequency 2x / week   PT Duration 8 weeks   PT Treatment/Interventions Cryotherapy;Electrical Stimulation;Gait training;Ultrasound;Moist Heat;Therapeutic activities;Therapeutic exercise;Balance training;Neuromuscular re-education;Patient/family education;Passive range of motion;Scar mobilization;Manual techniques;Energy conservation   PT Next Visit Plan modalities for pain relief; right rib mobilization to expand; soft tissue work to right diaphgram, lumbar paraspinal, and right side of T3-T6      Patient will benefit from skilled therapeutic intervention in order to improve the following deficits and impairments:  Abnormal gait, Decreased range of motion, Difficulty walking, Increased fascial restricitons, Decreased endurance, Increased muscle spasms, Dizziness, Decreased activity tolerance, Pain, Decreased balance, Impaired flexibility, Decreased strength, Decreased mobility  Visit Diagnosis: Muscle weakness (generalized)  Other abnormalities of gait and mobility  Pain in thoracic spine  Chronic right-sided low back pain without sciatica  Muscle spasm of back     Problem List Patient Active Problem List   Diagnosis Date Noted  . Idiopathic scoliosis 03/22/2016  . Diabetic neuropathy ([place]) 03/22/2016  . Dysautonomia orthostatic hypotension syndrome ([place]) 02/25/2016  . CKD (chronic kidney disease), stage III 02/19/2016  . Neuropathic pain of foot 02/18/2016  . Coronary artery disease involving coronary bypass graft of native heart without angina pectoris   . Diabetes mellitus type 2, diet-controlled ([place])   . 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 Medication management 08/11/2015   . Orthostatic hypotension 07/24/2015  . 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 NSTEMI (non-ST elevated myocardial infarction) ([place]) 07/01/2015  . BMI 25.0-25.9,adult 02/02/2015  . PVD (peripheral vascular disease) ([place]) 01/01/2014  . Mixed hyperlipidemia 04/11/2013  . Prediabetes 04/11/2013  . Supine hypertension   . Vitamin D deficiency   . Vitamin B 12 deficiency   . Coronary atherosclerosis- s/p PCI to LAD in 2009 and PCI to RCA in 2011 02/27/2009  . Abdominal aortic aneurysm ([place]) 02/27/2009  . GERD 02/27/2009  . History of IBS 02/27/2009    Mikle Bosworth PTA 05/10/2016, 10:41 AM  [facility] [address], [place], [place], [address] Phone: [phone]   Fax:  [phone]  Name: JEIEL KASSIN MRN: [id number] Date of Birth: 23-Jan-1934

## 2016-05-10 NOTE — Patient Instructions (Addendum)
(  Home) Retraction: Row - Bilateral (Anchor)    Facing anchor, arms reaching forward, pull hands toward stomach, pinching shoulder blades together. Repeat ____ times per set. Do ____ sets per session. Do ____ sessions per week. Use ____ lb weights.  Copyright  VHI. All rights reserved.   Scar Massage  Scar massage is done to improve the mobility of scar, decrease scar tissue from building up, reduce adhesions, and prevent Keloids from forming. Start scar massage after scabs have fallen off by themselves and no open areas. The first few weeks after surgery, it is normal for a scar to appear pink or red and slightly raised. Scars can itch or have areas of numbness. Some scars may be sensitive.   Direct Scar massage: after scar is healed, no opening, no scab 1.  Place pads of two fingers together directly on the scar starting at one end of the scar. Move the fingers up and down across the scar holding 5 seconds one direction.  Then go opposite direction hold 5 seconds.  2. Move over to the next section of the scar and repeat.  Work your way along the entire length of the scar.   3. Next make diagonal movements along the scar holding 5 seconds at one direction. 4. Next movement is side to side. 5. Do not rub fingers over the scar.  Instead keep firm pressure and move scar over the tissue it is on top   Scar Lift and Roll 12 weeks after surgery. 1. Pinch a small amount of the scar between your first two fingers and thumb.  2. Roll the scar between your fingers for 5 to 15 seconds. 3. Move along the scar and repeat until you have massaged the entire length of scar.   Stop the massage and call your doctor if you notice: 1. Increased redness 2. Bleeding from scar 3. Seepage coming from the scar 4. Scar is warmer and has increased pain

## 2016-05-13 ENCOUNTER — Ambulatory Visit: Payer: Medicare Other | Admitting: Physical Therapy

## 2016-05-13 ENCOUNTER — Encounter: Payer: Self-pay | Admitting: Physical Therapy

## 2016-05-13 DIAGNOSIS — G8929 Other chronic pain: Secondary | ICD-10-CM

## 2016-05-13 DIAGNOSIS — M546 Pain in thoracic spine: Secondary | ICD-10-CM | POA: Diagnosis not present

## 2016-05-13 DIAGNOSIS — R2689 Other abnormalities of gait and mobility: Secondary | ICD-10-CM | POA: Diagnosis not present

## 2016-05-13 DIAGNOSIS — M62830 Muscle spasm of back: Secondary | ICD-10-CM

## 2016-05-13 DIAGNOSIS — M545 Low back pain, unspecified: Secondary | ICD-10-CM

## 2016-05-13 DIAGNOSIS — M6281 Muscle weakness (generalized): Secondary | ICD-10-CM | POA: Diagnosis not present

## 2016-05-13 NOTE — Therapy (Signed)
[facility] [address], [place], [place], [address] Phone: [phone]   Fax:  [phone]  Physical Therapy Treatment  Patient Details  Name: [name] MRN: [id number] Date of Birth: [birth year] Referring Provider: Dr. Meredith Staggers  Encounter Date: 05/13/2016      PT End of Session - 05/13/16 0850    Visit Number 3   Number of Visits 10   Date for PT Re-Evaluation 06/30/16   Authorization Type g-code on 10th visit   PT Start Time 0845   PT Stop Time 0927   PT Time Calculation (min) 42 min   Activity Tolerance Patient tolerated treatment well   Behavior During Therapy [facility] for tasks assessed/performed      Past Medical History:  Diagnosis Date  . 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 Colon polyps   . Coronary atherosclerosis of unspecified type of vessel, native or graft   . Diabetes mellitus    Diet control   . Difficult intubation   . Esophageal reflux   . Gastritis, chronic   . Hypertension   . IBS (irritable bowel syndrome)   . Orthostatic hypotension    "BP has been dropping alot when I stand up for the last month or so" (02/17/2016)  . Other and unspecified hyperlipidemia   . Personal history of other diseases of digestive system   . Vitamin B 12 deficiency   . Vitamin D deficiency     Past Surgical History:  Procedure Laterality Date  . BLADDER SURGERY  1969   traumatic pelvic fractures, urethral and bladder repair  . CARDIAC CATHETERIZATION N/A 07/01/2015   Procedure: Left Heart Cath and Coronary Angiography;  Surgeon: Wellington Hampshire, MD;  Location: [place] [facility];  Service: Cardiovascular;  Laterality: N/A;  . CORONARY ANGIOPLASTY WITH STENT PLACEMENT    . CORONARY ARTERY BYPASS GRAFT N/A 07/06/2015   Procedure: CORONARY ARTERY BYPASS GRAFTING (CABG)x 4   utilizing the left internal mammary artery and endoscopically harvested bilateral  sapheneous vein.;  Surgeon: Ivin Poot, MD;  Location: [place];  Service: Open Heart Surgery;  Laterality: N/A;  . KNEE SURGERY    . TEE WITHOUT CARDIOVERSION N/A 07/06/2015   Procedure: TRANSESOPHAGEAL ECHOCARDIOGRAM (TEE);  Surgeon: Ivin Poot, MD;  Location: [place];  Service: Open Heart Surgery;  Laterality: N/A;    There were no vitals filed for this visit.      Subjective Assessment - 05/13/16 0849    Subjective Pt reports back doing ok this morning. 6/10 pain upon getting up and moveing. Didn't make up the bed this morning so pain didn't get as bad as usual. Pt has been doing scar mobilization exercises at home.    Pertinent History dizzy; sometimes legs give way   Limitations Standing   Patient Stated Goals reduce back pain, increase strength for prior function, walking 2 miles per day,    Currently in Pain? Yes   Pain Score 2    Pain Location Back   Pain Orientation Lower   Pain Descriptors / Indicators Aching   Pain Type Chronic pain   Pain Frequency Intermittent                         [facility] Adult PT Treatment/Exercise - 05/13/16 0001      Lumbar Exercises: Stretches   Active Hamstring Stretch 2 reps;10 seconds  With green strap   Single Knee to  Chest Stretch --     Lumbar Exercises: Aerobic   Stationary Bike Nustep L1 x 6 minutes  Seat 12 arms 11; therapist present to discuss treatment     Lumbar Exercises: Standing   Row Strengthening;Both;20 reps;Theraband  #20 verbal cues for posture and form   Shoulder Extension Strengthening;Both;20 reps;Theraband  #20 verbal cues for posture and form   Other Standing Lumbar Exercises Hip extension/ abduction  2x10   Other Standing Lumbar Exercises Hamstring curls  2x10     Lumbar Exercises: Seated   Sit to Stand 10 reps  Knee pain, excessive compensation     Lumbar Exercises: Supine   Ab Set 20 reps  Red ball knees to chest   Bent Knee Raise 20 reps   Bridge 10 reps   Straight Leg Raise 20 reps  Some back pain with Rt LE lift    Other Supine Lumbar Exercises Short arc quad  #3 x20 Bil     Shoulder Exercises: Standing   Horizontal ABduction --   Theraband Level (Shoulder Horizontal ABduction) --     Shoulder Exercises: Stretch   Other Shoulder Stretches Thoracic extension over towel  Seated x10                  PT Short Term Goals - 05/10/16 0932      PT SHORT TERM GOAL #1   Title independent with initial HEP   Time 4   Period Weeks   Status On-going     PT SHORT TERM GOAL #2   Title understand correct body mechanics with ADL's to reduce strain on his back   Time 4   Period Weeks   Status On-going     PT SHORT TERM GOAL #3   Title understand tips to reduce falls due to him being dizzy since his by-pass surgery   Time 4   Period Weeks   Status On-going     PT SHORT TERM GOAL #4   Title back pain with walking and standing decreased >/= 25% due to standing up straighter and increased trunk strength   Time 4   Period Weeks   Status On-going           PT Long Term Goals - 05/10/16 0932      PT LONG TERM GOAL #1   Title independent with HEP   Time 8   Period Weeks   Status On-going     PT LONG TERM GOAL #2   Title walk 1 mile at the park with 2-3 rests with SPC due to back pain decreased >/= 50%   Time 8   Period Weeks   Status On-going     PT LONG TERM GOAL #3   Title trunk extensor strength has improved so patient is able to walk in the community with upright posture and no verbal cues from wife   Time 8   Period Weeks   Status On-going     PT LONG TERM GOAL #4   Title sit to stand </= 14 sec due to improved balance   Time 8   Period Weeks   Status On-going     PT LONG TERM GOAL #5   Title get up from the floor after working on the car with >/= 50% greater ease due to increase strength and mobility   Time 8   Period Weeks   Status On-going     PT LONG TERM GOAL #6   Title FOTO score is </=  43% limitation   Time 8   Period Weeks   Status On-going                Plan - 05/13/16 [id number]    Clinical Impression Statement Pt presents with forward flexed posture, forward hips and head with Bil kne slightly bent. Pt lacking quad strength and hamstring length through the knees. Decreased core stability. Pt able to tolerate all exercies well with some fatigue. Pt will continue to benefit from skilled therapy for strenghtening and posture training.    Rehab Potential Good   Clinical Impairments Affecting Rehab Potential Heart bypass surgery 07/06/2015 and has been dizzy since then   PT Frequency 2x / week   PT Duration 8 weeks   PT Treatment/Interventions Cryotherapy;Electrical Stimulation;Gait training;Ultrasound;Moist Heat;Therapeutic activities;Therapeutic exercise;Balance training;Neuromuscular re-education;Patient/family education;Passive range of motion;Scar mobilization;Manual techniques;Energy conservation   PT Next Visit Plan Posture training, quad strength   Consulted and Agree with Plan of Care Patient;Family member/caregiver   Family Member Consulted wife      Patient will benefit from skilled therapeutic intervention in order to improve the following deficits and impairments:  Abnormal gait, Decreased range of motion, Difficulty walking, Increased fascial restricitons, Decreased endurance, Increased muscle spasms, Dizziness, Decreased activity tolerance, Pain, Decreased balance, Impaired flexibility, Decreased strength, Decreased mobility  Visit Diagnosis: Muscle weakness (generalized)  Other abnormalities of gait and mobility  Pain in thoracic spine  Chronic right-sided low back pain without sciatica  Muscle spasm of back     Problem List Patient Active Problem List   Diagnosis Date Noted  . Idiopathic scoliosis 03/22/2016  . Diabetic neuropathy ([place]) 03/22/2016  . Dysautonomia orthostatic hypotension syndrome ([place]) 02/25/2016  . CKD (chronic kidney disease), stage III 02/19/2016  . Neuropathic pain of foot  02/18/2016  . Coronary artery disease involving coronary bypass graft of native heart without angina pectoris   . Diabetes mellitus type 2, diet-controlled ([place])   . 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 Medication management 08/11/2015  . Orthostatic hypotension 07/24/2015  . 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 NSTEMI (non-ST elevated myocardial infarction) ([place]) 07/01/2015  . BMI 25.0-25.9,adult 02/02/2015  . PVD (peripheral vascular disease) ([place]) 01/01/2014  . Mixed hyperlipidemia 04/11/2013  . Prediabetes 04/11/2013  . Supine hypertension   . Vitamin D deficiency   . Vitamin B 12 deficiency   . Coronary atherosclerosis- s/p PCI to LAD in 2009 and PCI to RCA in 2011 02/27/2009  . Abdominal aortic aneurysm ([place]) 02/27/2009  . GERD 02/27/2009  . History of IBS 02/27/2009    Mikle Bosworth PTA 05/13/2016, 9:34 AM  [facility] [address], [place], [place], [address] Phone: [phone]   Fax:  [phone]  Name: ENZI BARNHARD MRN: [id number] Date of Birth: December 28, 1933

## 2016-05-17 ENCOUNTER — Encounter: Payer: Self-pay | Admitting: Physical Therapy

## 2016-05-17 ENCOUNTER — Ambulatory Visit (INDEPENDENT_AMBULATORY_CARE_PROVIDER_SITE_OTHER): Payer: Medicare Other | Admitting: Internal Medicine

## 2016-05-17 ENCOUNTER — Ambulatory Visit: Payer: Medicare Other | Admitting: Physical Therapy

## 2016-05-17 VITALS — BP 124/78 | HR 72 | Temp 97.4°F | Resp 16 | Ht 72.5 in | Wt 200.2 lb

## 2016-05-17 DIAGNOSIS — I10 Essential (primary) hypertension: Secondary | ICD-10-CM

## 2016-05-17 DIAGNOSIS — R7303 Prediabetes: Secondary | ICD-10-CM

## 2016-05-17 DIAGNOSIS — M6281 Muscle weakness (generalized): Secondary | ICD-10-CM

## 2016-05-17 DIAGNOSIS — M546 Pain in thoracic spine: Secondary | ICD-10-CM

## 2016-05-17 DIAGNOSIS — M545 Low back pain, unspecified: Secondary | ICD-10-CM

## 2016-05-17 DIAGNOSIS — I951 Orthostatic hypotension: Secondary | ICD-10-CM

## 2016-05-17 DIAGNOSIS — M62830 Muscle spasm of back: Secondary | ICD-10-CM | POA: Diagnosis not present

## 2016-05-17 DIAGNOSIS — G8929 Other chronic pain: Secondary | ICD-10-CM | POA: Diagnosis not present

## 2016-05-17 DIAGNOSIS — E782 Mixed hyperlipidemia: Secondary | ICD-10-CM

## 2016-05-17 DIAGNOSIS — G903 Multi-system degeneration of the autonomic nervous system: Secondary | ICD-10-CM

## 2016-05-17 DIAGNOSIS — R2689 Other abnormalities of gait and mobility: Secondary | ICD-10-CM

## 2016-05-17 DIAGNOSIS — Z79899 Other long term (current) drug therapy: Secondary | ICD-10-CM | POA: Diagnosis not present

## 2016-05-17 DIAGNOSIS — E559 Vitamin D deficiency, unspecified: Secondary | ICD-10-CM

## 2016-05-17 LAB — CBC WITH DIFFERENTIAL/PLATELET
Basophils Absolute: 0 cells/uL (ref 0–200)
Basophils Relative: 0 %
Eosinophils Absolute: 228 cells/uL (ref 15–500)
Eosinophils Relative: 4 %
HCT: 36.3 % — ABNORMAL LOW (ref 38.5–50.0)
Hemoglobin: 11.6 g/dL — ABNORMAL LOW (ref 13.2–17.1)
Lymphocytes Relative: 28 %
Lymphs Abs: 1596 cells/uL (ref 850–3900)
MCH: 29.3 pg (ref 27.0–33.0)
MCHC: 32 g/dL (ref 32.0–36.0)
MCV: 91.7 fL (ref 80.0–100.0)
MPV: 8.6 fL (ref 7.5–12.5)
Monocytes Absolute: 342 cells/uL (ref 200–950)
Monocytes Relative: 6 %
Neutro Abs: 3534 cells/uL (ref 1500–7800)
Neutrophils Relative %: 62 %
Platelets: 241 10*3/uL (ref 140–400)
RBC: 3.96 MIL/uL — ABNORMAL LOW (ref 4.20–5.80)
RDW: 14.8 % (ref 11.0–15.0)
WBC: 5.7 10*3/uL (ref 3.8–10.8)

## 2016-05-17 LAB — TSH: TSH: 2.78 mIU/L (ref 0.40–4.50)

## 2016-05-17 NOTE — Progress Notes (Signed)
[facility] [name], M.D.        Uvaldo Bristle. Silverio Lay, P.A.-C       Starlyn Skeans, P.A.-C  [facility]                [address] [place], [place] [id number] Telephone [phone] Telefax [phone] ______________________________________________________________________     This very nice 83 y.o. MWM presents for 6 month follow up with Dysautonomia wit, Hyperlipidemia, Pre-Diabetes and Vitamin D Deficiency.      Patient is treated for Dysautonomia with severe labile supine hypertension complicated by labile postural Hypotension and had no significant response to Midodrine &  has required titration of Florinef for volume expansion - titrating between mild dependent edema vs postural hypotension. Discontinuing his low dose amitriptyline also seemed to help. As he is expected to have supine Hypertension, he has been advised not to have his lying/sitting BP's measured or used as a disincentive to stopping the Florinef that he needs to maintain his volume expansion to achieve a standing BP that he can tolerate. His wife seems to have a very good understanding of this concept in titrating his Florinef dosing.  Today's BP is improved - (sitting-124/78/standing-120/76). Patient has had no complaints of any cardiac type chest pain, palpitations, dyspnea/orthopnea/PND, dizziness, claudication, or dependent edema. C/o occasional occipital HA and neck spasms.      Hyperlipidemia is controlled with diet & meds. Patient denies myalgias or other med SE's. Current  Lipids were are goal albeit elevated Trig's: Lab Results  Component Value Date   CHOL 172 05/17/2016   HDL 35 (L) 05/17/2016   LDLCALC 103 (H) 05/17/2016   TRIG 170 (H) 05/17/2016   CHOLHDL 4.9 05/17/2016      Also, the patient has history of T2_NIDDM PreDiabetes and has had no symptoms of reactive hypoglycemia, diabetic polys, paresthesias or visual  blurring.  Current  A1c is not at goal: Lab Results  Component Value Date   HGBA1C 6.4 (H) 05/17/2016      Further, the patient also has history of Vitamin D Deficiency and supplements vitamin D without any suspected side-effects. Current vitamin D is at goal:   Lab Results  Component Value Date   VD25OH 67 05/17/2016   Current Outpatient Prescriptions on File Prior to Visit  Medication Sig  . aspirin 81 MG EC tab Take 1 tab daily.  Marland Kitchen CALCIUM  Take 1 tab daily.  . Vit B12 1000 MCG tab Take daily.  . diclofenac  1 % GEL Apply 1  topically 3 x daily  . Flaxseed 1000 MG CAP Take 1,300 mgdaily.   Marland Kitchen FLORINEF 0.1 MG tab Take 2 tab daily.  Marland Kitchen FLONASE nasal spray SHAKE LIQUID AND USE 2 SPRAYS IN EACH NOSTRIL DAILY  . gabapentin  100 MG cap TAKE 1 CAP 3 xDAILY  . lidocaine  5 % oint Apply 1 application topically as needed  . Magnesium 250 MG TAB Take 500 mg daily.   . metoCLOPramide  10 MG  Take 1 tab every 6  hrs as needed for nausea   . multivitamin Take 1 tab daily.    Marland Kitchen omeprazole  20 MG cap TAKE 2 CAP EVERY DAY    Allergies  Allergen Reactions  . Cymbalta [Duloxetine Hcl]     Dizziness  . Keflex [Cephalexin] Other (See Comments)    Reaction: unknown   .  Simvastatin Other (See Comments)    Reaction: unknown   . Sudafed [Pseudoephedrine]     Dizziness   PMHx:   Past Medical History:  Diagnosis Date  . 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 Colon polyps   . Coronary atherosclerosis of unspecified type of vessel, native or graft   . Diabetes mellitus    Diet control   . Difficult intubation   . Esophageal reflux   . Gastritis, chronic   . Hypertension   . IBS (irritable bowel syndrome)   . Orthostatic hypotension    "BP has been dropping alot when I stand up for the last month or so" (02/17/2016)  . Other and unspecified hyperlipidemia   . Personal history of other diseases of digestive system   . Vitamin B 12 deficiency   . Vitamin D deficiency    Immunization History   Administered Date(s) Administered  . Influenza, High Dose Seasonal PF 12/30/2013, 02/02/2015, 01/27/2016  . Influenza-Unspecified 01/13/2013  . Pneumococcal Conjugate-13 02/14/2014  . Pneumococcal-Unspecified 06/15/2004  . Tetanus 10/01/2012  . Zoster 10/01/2012   Past Surgical History:  Procedure Laterality Date  . BLADDER SURGERY  1969   traumatic pelvic fractures, urethral and bladder repair  . CARDIAC CATHETERIZATION N/A 07/01/2015   Procedure: Left Heart Cath and Coronary Angiography;  Surgeon: Wellington Hampshire, MD;  Location: [place] [facility];  Service: Cardiovascular;  Laterality: N/A;  . CORONARY ANGIOPLASTY WITH STENT PLACEMENT    . CORONARY ARTERY BYPASS GRAFT N/A 07/06/2015   Procedure: CORONARY ARTERY BYPASS GRAFTING (CABG)x 4   utilizing the left internal mammary artery and endoscopically harvested bilateral  sapheneous vein.;  Surgeon: Ivin Poot, MD;  Location: [place];  Service: Open Heart Surgery;  Laterality: N/A;  . KNEE SURGERY    . TEE WITHOUT CARDIOVERSION N/A 07/06/2015   Procedure: TRANSESOPHAGEAL ECHOCARDIOGRAM (TEE);  Surgeon: Ivin Poot, MD;  Location: [place];  Service: Open Heart Surgery;  Laterality: N/A;   FHx:    Reviewed / unchanged  SHx:    Reviewed / unchanged  Systems Review:  Constitutional: Denies fever, chills, wt changes, headaches, insomnia, fatigue, night sweats, change in appetite. Eyes: Denies redness, blurred vision, diplopia, discharge, itchy, watery eyes.  ENT: Denies discharge, congestion, post nasal drip, epistaxis, sore throat, earache, hearing loss, dental pain, tinnitus, vertigo, sinus pain, snoring.  CV: Denies chest pain, palpitations, irregular heartbeat, syncope, dyspnea, diaphoresis, orthopnea, PND, claudication or edema. Respiratory: denies cough, dyspnea, DOE, pleurisy, hoarseness, laryngitis, wheezing.  Gastrointestinal: Denies dysphagia, odynophagia, heartburn, reflux, water brash, abdominal pain or cramps, nausea,  vomiting, bloating, diarrhea, constipation, hematemesis, melena, hematochezia  or hemorrhoids. Genitourinary: Denies dysuria, frequency, urgency, nocturia, hesitancy, discharge, hematuria or flank pain. Musculoskeletal: Denies arthralgias, myalgias, stiffness, jt. swelling, pain, limping or strain/sprain.  Skin: Denies pruritus, rash, hives, warts, acne, eczema or change in skin lesion(s). Neuro: No weakness, tremor, incoordination, spasms, paresthesia or pain. Psychiatric: Denies confusion, memory loss or sensory loss. Endo: Denies change in weight, skin or hair change.  Heme/Lymph: No excessive bleeding, bruising or enlarged lymph nodes.  Physical Exam  BP 124/78 Comment: sitting-124/78/standing-120/76  Pulse 72   Temp 97.4 F (36.3 C)   Resp 16   Ht 6' 0.5" (1.842 m)   Wt 200 lb 3.2 oz (90.8 kg)   BMI 26.78 kg/m   Appears well nourished and in no distress.  Eyes: PERRLA, EOMs, conjunctiva no swelling or erythema. Sinuses: No frontal/maxillary tenderness ENT/Mouth: EAC's clear, TM's nl w/o erythema,  bulging. Nares clear w/o erythema, swelling, exudates. Oropharynx clear without erythema or exudates. Oral hygiene is good. Tongue normal, non obstructing. Hearing intact.  Neck: Supple. Thyroid nl. Car 2+/2+ without bruits, nodes or JVD. Chest: Respirations nl with BS clear & equal w/o rales, rhonchi, wheezing or stridor.  Cor: Heart sounds normal w/ regular rate and rhythm without sig. murmurs, gallops, clicks, or rubs. Peripheral pulses normal and equal  without edema.  Abdomen: Soft & bowel sounds normal. Non-tender w/o guarding, rebound, hernias, masses, or organomegaly.  Lymphatics: Unremarkable.  Musculoskeletal: Full ROM all peripheral extremities, joint stability, 5/5 strength, and normal gait.  Skin: Warm, dry without exposed rashes, lesions or ecchymosis apparent. Has suspect BBE of dorsal left hand and vertex balding scalp Neuro: Cranial nerves intact, reflexes equal  bilaterally. Sensory-motor testing grossly intact. Tendon reflexes grossly intact.  Pysch: Alert & oriented x 3.  Insight and judgement nl & appropriate. No ideations.  Assessment and Plan:  1. Supine hypertension  - Continue medication, monitor blood pressure at home.  - Continue DASH diet. Reminder to go to the ER if any CP,  SOB, nausea, dizziness, severe HA, changes vision/speech,  left arm numbness and tingling and jaw pain. - CBC with Differential/Platelet - BASIC METABOLIC PANEL WITH GFR - TSH  2. Hyperlipidemia  - Continue diet/meds, exercise,& lifestyle modifications.  - Continue monitor periodic cholesterol/liver & renal functions  - Hepatic function panel - Lipid panel - TSH  3. Prediabetes  - Continue diet, exercise, lifestyle modifications.  - Monitor appropriate labs. - Hemoglobin A1c - Insulin, random  4. Vitamin D deficiency  - Continue supplementation. - VITAMIN D 25 Hydroxy   5. Dysautonomia orthostatic hypotension syndrome (HCC)   6. Medication management  - CBC with Differential/Platelet - BASIC METABOLIC PANEL WITH GFR - Hepatic function panel - Magnesium  7. BCE , scalp and dorsal L hand  - Rx Efudex 5% crm and return for excision of L hand lesion       Recommended regular exercise, BP monitoring, weight control, and discussed med and SE's. Recommended labs to assess and monitor clinical status. Further disposition pending results of labs. Over 30 minutes of exam, counseling, chart review was performed

## 2016-05-17 NOTE — Patient Instructions (Signed)

## 2016-05-17 NOTE — Therapy (Signed)
[facility] [address], [place], [place], [address] Phone: [phone]   Fax:  [phone]  Physical Therapy Treatment  Patient Details  Name: [name] MRN: [id number] Date of Birth: 07-Feb-1934 Referring Provider: Dr. Meredith Staggers  Encounter Date: 05/17/2016      PT End of Session - 05/17/16 1403    Visit Number 4   Number of Visits 10   Date for PT Re-Evaluation 06/30/16   Authorization Type g-code on 10th visit   PT Start Time 1403   PT Stop Time 1443   PT Time Calculation (min) 40 min   Activity Tolerance Patient tolerated treatment well   Behavior During Therapy [facility] for tasks assessed/performed      Past Medical History:  Diagnosis Date  . 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 Colon polyps   . Coronary atherosclerosis of unspecified type of vessel, native or graft   . Diabetes mellitus    Diet control   . Difficult intubation   . Esophageal reflux   . Gastritis, chronic   . Hypertension   . IBS (irritable bowel syndrome)   . Orthostatic hypotension    "BP has been dropping alot when I stand up for the last month or so" (02/17/2016)  . Other and unspecified hyperlipidemia   . Personal history of other diseases of digestive system   . Vitamin B 12 deficiency   . Vitamin D deficiency     Past Surgical History:  Procedure Laterality Date  . BLADDER SURGERY  1969   traumatic pelvic fractures, urethral and bladder repair  . CARDIAC CATHETERIZATION N/A 07/01/2015   Procedure: Left Heart Cath and Coronary Angiography;  Surgeon: Wellington Hampshire, MD;  Location: [place] [facility];  Service: Cardiovascular;  Laterality: N/A;  . CORONARY ANGIOPLASTY WITH STENT PLACEMENT    . CORONARY ARTERY BYPASS GRAFT N/A 07/06/2015   Procedure: CORONARY ARTERY BYPASS GRAFTING (CABG)x 4   utilizing the left internal mammary artery and endoscopically harvested bilateral  sapheneous vein.;  Surgeon: Ivin Poot, MD;  Location: [place];  Service: Open Heart Surgery;  Laterality: N/A;  . KNEE SURGERY    . TEE WITHOUT CARDIOVERSION N/A 07/06/2015   Procedure: TRANSESOPHAGEAL ECHOCARDIOGRAM (TEE);  Surgeon: Ivin Poot, MD;  Location: [place];  Service: Open Heart Surgery;  Laterality: N/A;    There were no vitals filed for this visit.      Subjective Assessment - 05/17/16 1401    Subjective Pt reports back doing ok. Right low back is worst, edpacially with getting up first thing.    Pertinent History dizzy; sometimes legs give way   Limitations Standing   Patient Stated Goals reduce back pain, increase strength for prior function, walking 2 miles per day,    Currently in Pain? Yes   Pain Score 2    Pain Location Back   Pain Orientation Lower   Pain Descriptors / Indicators Aching   Pain Type Chronic pain   Pain Radiating Towards None   Pain Frequency Intermittent   Aggravating Factors  walking, first thing in the morning, making the bed   Pain Relieving Factors sit to rest                         [facility] Adult PT Treatment/Exercise - 05/17/16 0001      Lumbar Exercises: Stretches   Active Hamstring Stretch 2 reps;10 seconds  With green  strap   Passive Hamstring Stretch --  Standing gastroc stretch   Single Knee to Chest Stretch --   Standing Side Bend 2 reps;10 seconds     Lumbar Exercises: Aerobic   Stationary Bike Nustep L2 x 6 minutes  Seat 12 arms 11; therapist present to discuss treatment     Lumbar Exercises: Standing   Row Strengthening;Both;20 reps;Theraband  #20 verbal cues for posture and form   Shoulder Extension Strengthening;Both;20 reps;Theraband  #20 verbal cues for posture and form   Other Standing Lumbar Exercises Hip extension/ abduction  2x10     Lumbar Exercises: Seated   Long Arc Quad on Chair Strengthening;2 sets;10 reps  #3   Sit to Stand 20 reps  From high low table     Lumbar Exercises: Supine   Ab Set 20 reps  Red ball knees  to chest   Bent Knee Raise 20 reps   Bridge 10 reps   Other Supine Lumbar Exercises Short arc quad  #3 x20 Bil   Other Supine Lumbar Exercises Ball squeeze  3 second holds     Shoulder Exercises: Stretch   Other Shoulder Stretches Thoracic extension over towel  Seated x10                  PT Short Term Goals - 05/17/16 1403      PT SHORT TERM GOAL #1   Title independent with initial HEP   Time 4   Period Weeks   Status On-going     PT SHORT TERM GOAL #2   Title understand correct body mechanics with ADL's to reduce strain on his back   Time 4   Period Weeks   Status On-going     PT SHORT TERM GOAL #3   Title understand tips to reduce falls due to him being dizzy since his by-pass surgery   Time 4   Period Weeks   Status On-going     PT SHORT TERM GOAL #4   Title back pain with walking and standing decreased >/= 25% due to standing up straighter and increased trunk strength   Time 4   Period Weeks   Status On-going           PT Long Term Goals - 05/17/16 1404      PT LONG TERM GOAL #1   Title independent with HEP   Time 8   Period Weeks   Status On-going     PT LONG TERM GOAL #2   Title walk 1 mile at the park with 2-3 rests with SPC due to back pain decreased >/= 50%   Time 8   Period Weeks   Status On-going     PT LONG TERM GOAL #3   Title trunk extensor strength has improved so patient is able to walk in the community with upright posture and no verbal cues from wife   Time 8   Period Weeks   Status On-going     PT LONG TERM GOAL #4   Title sit to stand </= 14 sec due to improved balance   Time 8   Period Weeks   Status On-going     PT LONG TERM GOAL #5   Title get up from the floor after working on the car with >/= 50% greater ease due to increase strength and mobility   Time 8   Period Weeks   Status On-going     PT LONG TERM GOAL #6   Title [employer]  score is </= 43% limitation   Time 8   Period Weeks   Status On-going                Plan - 05/17/16 1447    Clinical Impression Statement Pt continues to have decreased posture and strength and reports of low back pain. Pt able to tolerate all strengthening exercises well. Responded well to stretches. Pt will continue to benefit from skilled therapy for core and LE strengthening and posture.    Rehab Potential Good   Clinical Impairments Affecting Rehab Potential Heart bypass surgery 07/06/2015 and has been dizzy since then   PT Frequency 2x / week   PT Duration 8 weeks   PT Treatment/Interventions Cryotherapy;Electrical Stimulation;Gait training;Ultrasound;Moist Heat;Therapeutic activities;Therapeutic exercise;Balance training;Neuromuscular re-education;Patient/family education;Passive range of motion;Scar mobilization;Manual techniques;Energy conservation   PT Next Visit Plan Posture training, quad strength   Family Member Consulted Patient      Patient will benefit from skilled therapeutic intervention in order to improve the following deficits and impairments:  Abnormal gait, Decreased range of motion, Difficulty walking, Increased fascial restricitons, Decreased endurance, Increased muscle spasms, Dizziness, Decreased activity tolerance, Pain, Decreased balance, Impaired flexibility, Decreased strength, Decreased mobility  Visit Diagnosis: Muscle weakness (generalized)  Other abnormalities of gait and mobility  Pain in thoracic spine  Chronic right-sided low back pain without sciatica  Muscle spasm of back     Problem List Patient Active Problem List   Diagnosis Date Noted  . Idiopathic scoliosis 03/22/2016  . Diabetic neuropathy ([place]) 03/22/2016  . Dysautonomia orthostatic hypotension syndrome ([place]) 02/25/2016  . CKD (chronic kidney disease), stage III 02/19/2016  . Neuropathic pain of foot 02/18/2016  . Coronary artery disease involving coronary bypass graft of native heart without angina pectoris   . Diabetes mellitus type 2,  diet-controlled ([place])   . 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 Medication management 08/11/2015  . Orthostatic hypotension 07/24/2015  . 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 NSTEMI (non-ST elevated myocardial infarction) ([place]) 07/01/2015  . BMI 25.0-25.9,adult 02/02/2015  . PVD (peripheral vascular disease) ([place]) 01/01/2014  . Mixed hyperlipidemia 04/11/2013  . Prediabetes 04/11/2013  . Supine hypertension   . Vitamin D deficiency   . Vitamin B 12 deficiency   . Coronary atherosclerosis- s/p PCI to LAD in 2009 and PCI to RCA in 2011 02/27/2009  . Abdominal aortic aneurysm ([place]) 02/27/2009  . GERD 02/27/2009  . History of IBS 02/27/2009    Mikle Bosworth PTA 05/17/2016, 2:54 PM  [facility] [address] [name], [place], [address] Phone: [phone]   Fax:  [phone]  Name: EUGENIO TIEN MRN: [id number] Date of Birth: 1934/03/27

## 2016-05-18 LAB — HEPATIC FUNCTION PANEL
ALT: 10 U/L (ref 9–46)
AST: 19 U/L (ref 10–35)
Albumin: 3.7 g/dL (ref 3.6–5.1)
Alkaline Phosphatase: 69 U/L (ref 40–115)
Bilirubin, Direct: 0.1 mg/dL (ref <=0.2)
Indirect Bilirubin: 0.2 mg/dL (ref 0.2–1.2)
Total Bilirubin: 0.3 mg/dL (ref 0.2–1.2)
Total Protein: 6.3 g/dL (ref 6.1–8.1)

## 2016-05-18 LAB — HEMOGLOBIN A1C
Hgb A1c MFr Bld: 6.4 % — ABNORMAL HIGH (ref <5.7)
Mean Plasma Glucose: 137 mg/dL

## 2016-05-18 LAB — INSULIN, RANDOM: Insulin: 48.5 u[IU]/mL — ABNORMAL HIGH (ref 2.0–19.6)

## 2016-05-18 LAB — LIPID PANEL
Cholesterol: 172 mg/dL (ref <200)
HDL: 35 mg/dL — ABNORMAL LOW (ref >40)
LDL Cholesterol: 103 mg/dL — ABNORMAL HIGH (ref <100)
Total CHOL/HDL Ratio: 4.9 Ratio (ref <5.0)
Triglycerides: 170 mg/dL — ABNORMAL HIGH (ref <150)
VLDL: 34 mg/dL — ABNORMAL HIGH (ref <30)

## 2016-05-18 LAB — MAGNESIUM: Magnesium: 2.2 mg/dL (ref 1.5–2.5)

## 2016-05-18 LAB — BASIC METABOLIC PANEL WITH GFR
BUN: 17 mg/dL (ref 7–25)
CO2: 22 mmol/L (ref 20–31)
Calcium: 9.1 mg/dL (ref 8.6–10.3)
Chloride: 106 mmol/L (ref 98–110)
Creat: 1.33 mg/dL — ABNORMAL HIGH (ref 0.70–1.11)
GFR, Est African American: 58 mL/min — ABNORMAL LOW (ref >=60)
GFR, Est Non African American: 50 mL/min — ABNORMAL LOW (ref >=60)
Glucose, Bld: 140 mg/dL — ABNORMAL HIGH (ref 65–99)
Potassium: 4.5 mmol/L (ref 3.5–5.3)
Sodium: 146 mmol/L (ref 135–146)

## 2016-05-18 LAB — VITAMIN D 25 HYDROXY (VIT D DEFICIENCY, FRACTURES): Vit D, 25-Hydroxy: 67 ng/mL (ref 30–100)

## 2016-05-20 ENCOUNTER — Encounter: Payer: Self-pay | Admitting: Internal Medicine

## 2016-05-20 ENCOUNTER — Encounter: Payer: Self-pay | Admitting: Physical Therapy

## 2016-05-20 ENCOUNTER — Ambulatory Visit: Payer: Medicare Other | Admitting: Physical Therapy

## 2016-05-20 DIAGNOSIS — G8929 Other chronic pain: Secondary | ICD-10-CM

## 2016-05-20 DIAGNOSIS — M62830 Muscle spasm of back: Secondary | ICD-10-CM

## 2016-05-20 DIAGNOSIS — M6281 Muscle weakness (generalized): Secondary | ICD-10-CM

## 2016-05-20 DIAGNOSIS — M545 Low back pain, unspecified: Secondary | ICD-10-CM

## 2016-05-20 DIAGNOSIS — R2689 Other abnormalities of gait and mobility: Secondary | ICD-10-CM

## 2016-05-20 DIAGNOSIS — M546 Pain in thoracic spine: Secondary | ICD-10-CM | POA: Diagnosis not present

## 2016-05-20 MED ORDER — DIAZEPAM 5 MG PO TABS: ORAL_TABLET | ORAL | 2 refills | Status: AC

## 2016-05-20 NOTE — Therapy (Signed)
[facility] [address] [place], [place], [address] Phone: [phone]   Fax:  [phone]  Physical Therapy Treatment  Patient Details  Name: [name] MRN: [id number] Date of Birth: 01-Aug-1933 Referring Provider: Dr. Meredith Staggers  Encounter Date: 05/20/2016      PT End of Session - 05/20/16 0932    Visit Number 5   Number of Visits 10   Date for PT Re-Evaluation 06/30/16   Authorization Type g-code on 10th visit   PT Start Time 0929   PT Stop Time 1009   PT Time Calculation (min) 40 min   Activity Tolerance Patient tolerated treatment well   Behavior During Therapy [facility] for tasks assessed/performed      Past Medical History:  Diagnosis Date  . 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 Colon polyps   . Coronary atherosclerosis of unspecified type of vessel, native or graft   . Diabetes mellitus    Diet control   . Difficult intubation   . Esophageal reflux   . Gastritis, chronic   . Hypertension   . IBS (irritable bowel syndrome)   . Orthostatic hypotension    "BP has been dropping alot when I stand up for the last month or so" (02/17/2016)  . Other and unspecified hyperlipidemia   . Personal history of other diseases of digestive system   . Vitamin B 12 deficiency   . Vitamin D deficiency     Past Surgical History:  Procedure Laterality Date  . BLADDER SURGERY  1969   traumatic pelvic fractures, urethral and bladder repair  . CARDIAC CATHETERIZATION N/A 07/01/2015   Procedure: Left Heart Cath and Coronary Angiography;  Surgeon: Wellington Hampshire, MD;  Location: [place] [facility];  Service: Cardiovascular;  Laterality: N/A;  . CORONARY ANGIOPLASTY WITH STENT PLACEMENT    . CORONARY ARTERY BYPASS GRAFT N/A 07/06/2015   Procedure: CORONARY ARTERY BYPASS GRAFTING (CABG)x 4   utilizing the left internal mammary artery and endoscopically harvested bilateral  sapheneous vein.;  Surgeon: Ivin Poot, MD;  Location: [place];  Service: Open Heart Surgery;  Laterality: N/A;  . KNEE SURGERY    . TEE WITHOUT CARDIOVERSION N/A 07/06/2015   Procedure: TRANSESOPHAGEAL ECHOCARDIOGRAM (TEE);  Surgeon: Ivin Poot, MD;  Location: [place];  Service: Open Heart Surgery;  Laterality: N/A;    There were no vitals filed for this visit.      Subjective Assessment - 05/20/16 0931    Subjective Pt reports feeling very dizzy today. Went to MD on Tuesday who gave him meds that have made him more dizzy. Pt does not recall name of medicine.    Pertinent History dizzy; sometimes legs give way   Limitations Standing   Patient Stated Goals reduce back pain, increase strength for prior function, walking 2 miles per day,    Currently in Pain? Yes   Pain Score 2    Pain Location Back   Pain Orientation Lower   Pain Descriptors / Indicators Aching   Pain Type Chronic pain   Pain Radiating Towards None   Pain Frequency Intermittent                         [facility] Adult PT Treatment/Exercise - 05/20/16 0001      Lumbar Exercises: Stretches   Active Hamstring Stretch 2 reps;10 seconds  With green strap   Passive Hamstring Stretch --  Standing gastroc  stretch     Lumbar Exercises: Aerobic   Stationary Bike Nustep L2 x 6 minutes  Seat 12 arms 11; therapist present to discuss treatment     Lumbar Exercises: Standing   Row Strengthening;Both;20 reps;Theraband  #20 verbal cues for posture and form   Shoulder Extension Strengthening;Both;20 reps;Theraband  #20 verbal cues for posture and form      Lumbar Exercises: Seated   Long Arc Quad on Chair Strengthening;2 sets;10 reps  #3     Lumbar Exercises: Supine   Ab Set 20 reps  Red ball knees to chest   Bent Knee Raise 20 reps  #3   Bridge 10 reps   Other Supine Lumbar Exercises Short arc quad  #5 x20 Bil   Other Supine Lumbar Exercises Ball squeeze  3 second holds     Lumbar Exercises: Sidelying   Hip Abduction 20 reps  Red  band, Seated     Shoulder Exercises: Supine   Horizontal ABduction Strengthening;Both;15 reps;Theraband   Theraband Level (Shoulder Horizontal ABduction) Level 2 (Red)   External Rotation Strengthening;Both;15 reps;Theraband   Theraband Level (Shoulder External Rotation) Level 2 (Red)   Flexion Strengthening;15 reps;Theraband  With cane   Theraband Level (Shoulder Flexion) Level 2 (Red)                  PT Short Term Goals - 05/17/16 1403      PT SHORT TERM GOAL #1   Title independent with initial HEP   Time 4   Period Weeks   Status On-going     PT SHORT TERM GOAL #2   Title understand correct body mechanics with ADL's to reduce strain on his back   Time 4   Period Weeks   Status On-going     PT SHORT TERM GOAL #3   Title understand tips to reduce falls due to him being dizzy since his by-pass surgery   Time 4   Period Weeks   Status On-going     PT SHORT TERM GOAL #4   Title back pain with walking and standing decreased >/= 25% due to standing up straighter and increased trunk strength   Time 4   Period Weeks   Status On-going           PT Long Term Goals - 05/17/16 1404      PT LONG TERM GOAL #1   Title independent with HEP   Time 8   Period Weeks   Status On-going     PT LONG TERM GOAL #2   Title walk 1 mile at the park with 2-3 rests with SPC due to back pain decreased >/= 50%   Time 8   Period Weeks   Status On-going     PT LONG TERM GOAL #3   Title trunk extensor strength has improved so patient is able to walk in the community with upright posture and no verbal cues from wife   Time 8   Period Weeks   Status On-going     PT LONG TERM GOAL #4   Title sit to stand </= 14 sec due to improved balance   Time 8   Period Weeks   Status On-going     PT LONG TERM GOAL #5   Title get up from the floor after working on the car with >/= 50% greater ease due to increase strength and mobility   Time 8   Period Weeks   Status On-going  PT LONG TERM GOAL #6   Title FOTO score is </= 43% limitation   Time 8   Period Weeks   Status On-going               Plan - 05/20/16 0951    Clinical Impression Statement Pt feeling dizzy and weak today, moveing slower than usual. Pt performed all standing exercises seated. Pt continues to have pain in Rt side low back especially with getting out of bed in am. Pt continues to have forward head posture and rounded back.. Pt will continue to benefit from skilled therapy for postural strengthening and LE strenghtening.    Rehab Potential Good   Clinical Impairments Affecting Rehab Potential Heart bypass surgery 07/06/2015 and has been dizzy since then   PT Frequency 2x / week   PT Duration 8 weeks   PT Treatment/Interventions Cryotherapy;Electrical Stimulation;Gait training;Ultrasound;Moist Heat;Therapeutic activities;Therapeutic exercise;Balance training;Neuromuscular re-education;Patient/family education;Passive range of motion;Scar mobilization;Manual techniques;Energy conservation   PT Next Visit Plan Posture training, quad strength   Family Member Consulted Patient      Patient will benefit from skilled therapeutic intervention in order to improve the following deficits and impairments:  Abnormal gait, Decreased range of motion, Difficulty walking, Increased fascial restricitons, Decreased endurance, Increased muscle spasms, Dizziness, Decreased activity tolerance, Pain, Decreased balance, Impaired flexibility, Decreased strength, Decreased mobility  Visit Diagnosis: Muscle weakness (generalized)  Other abnormalities of gait and mobility  Pain in thoracic spine  Chronic right-sided low back pain without sciatica  Muscle spasm of back     Problem List Patient Active Problem List   Diagnosis Date Noted  . Idiopathic scoliosis 03/22/2016  . Diabetic neuropathy ([place]) 03/22/2016  . Dysautonomia orthostatic hypotension syndrome ([place]) 02/25/2016  . CKD (chronic kidney  disease), stage III 02/19/2016  . Neuropathic pain of foot 02/18/2016  . Coronary artery disease involving coronary bypass graft of native heart without angina pectoris   . Diabetes mellitus type 2, diet-controlled ([place])   . 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 Medication management 08/11/2015  . Orthostatic hypotension 07/24/2015  . 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 NSTEMI (non-ST elevated myocardial infarction) ([place]) 07/01/2015  . BMI 25.0-25.9,adult 02/02/2015  . PVD (peripheral vascular disease) ([place]) 01/01/2014  . Mixed hyperlipidemia 04/11/2013  . Prediabetes 04/11/2013  . Supine hypertension   . Vitamin D deficiency   . Vitamin B 12 deficiency   . Coronary atherosclerosis- s/p PCI to LAD in 2009 and PCI to RCA in 2011 02/27/2009  . Abdominal aortic aneurysm ([place]) 02/27/2009  . GERD 02/27/2009  . History of IBS 02/27/2009    Mikle Bosworth PTA 05/20/2016, 10:09 AM  [facility] [address] [place], [place], [address] Phone: [phone]   Fax:  [phone]  Name: TOLLIE GARGUS MRN: [id number] Date of Birth: Sep 17, 1933

## 2016-05-23 ENCOUNTER — Other Ambulatory Visit: Payer: Self-pay | Admitting: Internal Medicine

## 2016-05-24 ENCOUNTER — Ambulatory Visit: Payer: Medicare Other

## 2016-05-24 DIAGNOSIS — M546 Pain in thoracic spine: Secondary | ICD-10-CM | POA: Diagnosis not present

## 2016-05-24 DIAGNOSIS — M62830 Muscle spasm of back: Secondary | ICD-10-CM | POA: Diagnosis not present

## 2016-05-24 DIAGNOSIS — M545 Low back pain, unspecified: Secondary | ICD-10-CM

## 2016-05-24 DIAGNOSIS — G8929 Other chronic pain: Secondary | ICD-10-CM | POA: Diagnosis not present

## 2016-05-24 DIAGNOSIS — R2689 Other abnormalities of gait and mobility: Secondary | ICD-10-CM | POA: Diagnosis not present

## 2016-05-24 DIAGNOSIS — M6281 Muscle weakness (generalized): Secondary | ICD-10-CM | POA: Diagnosis not present

## 2016-05-24 NOTE — Therapy (Signed)
[facility] [address] [place], [place], [address] Phone: [phone]   Fax:  [phone]  Physical Therapy Treatment  Patient Details  Name: [name] MRN: [id number] Date of Birth: [birth year] Referring Provider: Dr. Meredith Staggers  Encounter Date: 05/24/2016      [name] End of Session - 05/24/16 1016    Visit Number 6   Number of Visits 10   Date for [name] Re-Evaluation 06/30/16   Authorization Type g-code on 10th visit   [name] Start Time 0931   [name] Stop Time 1015   [name] Time Calculation (min) 44 min   Activity Tolerance Patient tolerated treatment well   Behavior During Therapy [facility] for tasks assessed/performed      Past Medical History:  Diagnosis Date  . 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 Colon polyps   . Coronary atherosclerosis of unspecified type of vessel, native or graft   . Diabetes mellitus    Diet control   . Difficult intubation   . Esophageal reflux   . Gastritis, chronic   . Hypertension   . IBS (irritable bowel syndrome)   . Orthostatic hypotension    "BP has been dropping alot when I stand up for the last month or so" (02/17/2016)  . Other and unspecified hyperlipidemia   . Personal history of other diseases of digestive system   . Vitamin B 12 deficiency   . Vitamin D deficiency     Past Surgical History:  Procedure Laterality Date  . BLADDER SURGERY  1969   traumatic pelvic fractures, urethral and bladder repair  . CARDIAC CATHETERIZATION N/A 07/01/2015   Procedure: Left Heart Cath and Coronary Angiography;  Surgeon: Wellington Hampshire, MD;  Location: [place] [facility];  Service: Cardiovascular;  Laterality: N/A;  . CORONARY ANGIOPLASTY WITH STENT PLACEMENT    . CORONARY ARTERY BYPASS GRAFT N/A 07/06/2015   Procedure: CORONARY ARTERY BYPASS GRAFTING (CABG)x 4   utilizing the left internal mammary artery and endoscopically harvested bilateral  sapheneous vein.;  Surgeon: Ivin Poot, MD;  Location: [place];  Service: Open Heart Surgery;  Laterality: N/A;  . KNEE SURGERY    . TEE WITHOUT CARDIOVERSION N/A 07/06/2015   Procedure: TRANSESOPHAGEAL ECHOCARDIOGRAM (TEE);  Surgeon: Ivin Poot, MD;  Location: [place];  Service: Open Heart Surgery;  Laterality: N/A;    There were no vitals filed for this visit.      Subjective Assessment - 05/24/16 0931    Subjective I feel OK.  My back is hurting.     Pertinent History dizzy; sometimes legs give way   Patient Stated Goals reduce back pain, increase strength for prior function, walking 2 miles per day,    Currently in Pain? Yes   Pain Score 2    Pain Location Back   Pain Orientation Lower   Pain Descriptors / Indicators Aching   Pain Type Chronic pain   Pain Onset More than a month ago   Pain Frequency Intermittent   Aggravating Factors  walking, early morning, standing   Pain Relieving Factors as the day progresses, sitting down                         [facility] Adult [name] Treatment/Exercise - 05/24/16 0001      Exercises   Exercises Knee/Hip     Lumbar Exercises: Stretches   Active Hamstring Stretch 2 reps;10 seconds  seated  Lumbar Exercises: Aerobic   Stationary Bike Level 2 x 8 minutes  [name] present to discuss progress     Lumbar Exercises: Standing   Row Strengthening;Both;20 reps;Theraband  20# verbal cues for posture and form   Shoulder Extension Strengthening;Both;20 reps;Theraband  20#  verbal cues for posture and form      Lumbar Exercises: Seated   Long Arc Quad on Chair Strengthening;2 sets;10 reps  3#     Lumbar Exercises: Supine   Bridge 10 reps   Other Supine Lumbar Exercises Ball squeeze  3 second holds     Knee/Hip Exercises: Standing   Hip Abduction Stengthening;Both;2 sets;10 reps     Shoulder Exercises: Supine   Horizontal ABduction Strengthening;Both;15 reps;Theraband   Theraband Level (Shoulder Horizontal ABduction) Level 2 (Red)   External Rotation  Strengthening;Both;15 reps;Theraband   Theraband Level (Shoulder External Rotation) Level 2 (Red)   Flexion Strengthening;15 reps;Theraband  With cane   Theraband Level (Shoulder Flexion) Level 2 (Red)                [name] Education - 05/24/16 0940    Education provided Yes   Education Details body mechanics, fall prevention   Person(s) Educated Patient   Methods Explanation;Demonstration;Handout   Comprehension Verbalized understanding          [name] Short Term Goals - 05/24/16 0937      [name] SHORT TERM GOAL #1   Title independent with initial HEP   Status Achieved     [name] SHORT TERM GOAL #2   Title understand correct body mechanics with ADL's to reduce strain on his back   Status Achieved     [name] SHORT TERM GOAL #3   Title understand tips to reduce falls due to him being dizzy since his by-pass surgery   Status Achieved     [name] SHORT TERM GOAL #4   Title back pain with walking and standing decreased >/= 25% due to standing up straighter and increased trunk strength   Time 4   Period Weeks   Status On-going           [name] Long Term Goals - 05/17/16 1404      [name] LONG TERM GOAL #1   Title independent with HEP   Time 8   Period Weeks   Status On-going     [name] LONG TERM GOAL #2   Title walk 1 mile at the park with 2-3 rests with SPC due to back pain decreased >/= 50%   Time 8   Period Weeks   Status On-going     [name] LONG TERM GOAL #3   Title trunk extensor strength has improved so patient is able to walk in the community with upright posture and no verbal cues from wife   Time 8   Period Weeks   Status On-going     [name] LONG TERM GOAL #4   Title sit to stand </= 14 sec due to improved balance   Time 8   Period Weeks   Status On-going     [name] LONG TERM GOAL #5   Title get up from the floor after working on the car with >/= 50% greater ease due to increase strength and mobility   Time 8   Period Weeks   Status On-going     [name] LONG TERM GOAL #6   Title  FOTO score is </= 43% limitation   Time 8   Period Weeks   Status On-going  Plan - 05/24/16 0945    Clinical Impression Statement [name] received education regarding body mechanics modifications and fall prevention due to dizziness.  [name] requires constant monitoring due to dizziness during treatment.  [name] with continued LBP especially in the morning and with standing.  [name] will continue to benefit from skilled [name] for strength, balance and flexiblity to improve balance and reduce LBP.     Rehab Potential Good   Clinical Impairments Affecting Rehab Potential Heart bypass surgery 07/06/2015 and has been dizzy since then   [name] Frequency 2x / week   [name] Duration 8 weeks   [name] Treatment/Interventions Cryotherapy;Electrical Stimulation;Gait training;Ultrasound;Moist Heat;Therapeutic activities;Therapeutic exercise;Balance training;Neuromuscular re-education;Patient/family education;Passive range of motion;Scar mobilization;Manual techniques;Energy conservation   [name] Next Visit Plan Posture training, quad strength. core strength   Consulted and Agree with Plan of Care Patient      Patient will benefit from skilled therapeutic intervention in order to improve the following deficits and impairments:  Abnormal gait, Decreased range of motion, Difficulty walking, Increased fascial restricitons, Decreased endurance, Increased muscle spasms, Dizziness, Decreased activity tolerance, Pain, Decreased balance, Impaired flexibility, Decreased strength, Decreased mobility  Visit Diagnosis: Muscle weakness (generalized)  Other abnormalities of gait and mobility  Pain in thoracic spine  Chronic right-sided low back pain without sciatica  Muscle spasm of back     Problem List Patient Active Problem List   Diagnosis Date Noted  . Idiopathic scoliosis 03/22/2016  . Diabetic neuropathy ([place]) 03/22/2016  . Dysautonomia orthostatic hypotension syndrome ([place]) 02/25/2016  . CKD (chronic kidney  disease), stage III 02/19/2016  . Neuropathic pain of foot 02/18/2016  . Coronary artery disease involving coronary bypass graft of native heart without angina pectoris   . Diabetes mellitus type 2, diet-controlled ([place])   . 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 Medication management 08/11/2015  . Orthostatic hypotension 07/24/2015  . 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 NSTEMI (non-ST elevated myocardial infarction) ([place]) 07/01/2015  . BMI 25.0-25.9,adult 02/02/2015  . PVD (peripheral vascular disease) ([place]) 01/01/2014  . Mixed hyperlipidemia 04/11/2013  . Prediabetes 04/11/2013  . Supine hypertension   . Vitamin D deficiency   . Vitamin B 12 deficiency   . Coronary atherosclerosis- s/p PCI to LAD in 2009 and PCI to RCA in 2011 02/27/2009  . Abdominal aortic aneurysm ([place]) 02/27/2009  . GERD 02/27/2009  . History of IBS 02/27/2009    [name], [name] 05/24/16 10:19 AM  [facility] [address], [place], [place], [address] Phone: [phone]   Fax:  [phone]  Name: [name] MRN: [id number] Date of Birth: 1933/07/27

## 2016-05-24 NOTE — Patient Instructions (Addendum)
Lifting Principles  .Maintain proper posture and head alignment. .Slide object as close as possible before lifting. .Move obstacles out of the way. .Test before lifting; ask for help if too heavy. .Tighten stomach muscles without holding breath. .Use smooth movements; do not jerk. .Use legs to do the work, and pivot with feet. .Distribute the work load symmetrically and close to the center of trunk. .Push instead of pull whenever possible.   Squat down and hold basket close to stand. Use leg muscles to do the work.    Avoid twisting or bending back. Pivot around using foot movements, and bend at knees if needed when reaching for articles.        Getting Into / Out of Bed   Lower self to lie down on one side by raising legs and lowering head at the same time. Use arms to assist moving without twisting. Bend both knees to roll onto back if desired. To sit up, start from lying on side, and use same move-ments in reverse. Keep trunk aligned with legs.    Shift weight from front foot to back foot as item is lifted off shelf.    When leaning forward to pick object up from floor, extend one leg out behind. Keep back straight. Hold onto a sturdy support with other hand.      Sit upright, head facing forward. Try using a roll to support lower back. Keep shoulders relaxed, and avoid rounded back. Keep hips level with knees. Avoid crossing legs for long periods.    Fall Prevention and Home Safety Falls cause injuries and can affect all age groups. It is possible to use preventive measures to significantly decrease the likelihood of falls. There are many simple measures which can make your home safer and prevent falls. OUTDOORS  Repair cracks and edges of walkways and driveways.  Remove high doorway thresholds.  Trim shrubbery on the main path into your home.  Have good outside lighting.  Clear walkways of tools, rocks, debris, and clutter.  Check that handrails  are not broken and are securely fastened. Both sides of steps should have handrails.  Have leaves, snow, and ice cleared regularly.  Use sand or salt on walkways during winter months.  In the garage, clean up grease or oil spills. BATHROOM  Install night lights.  Install grab bars by the toilet and in the tub and shower.  Use non-skid mats or decals in the tub or shower.  Place a plastic non-slip stool in the shower to sit on, if needed.  Keep floors dry and clean up all water on the floor immediately.  Remove soap buildup in the tub or shower on a regular basis.  Secure bath mats with non-slip, double-sided rug tape.  Remove throw rugs and tripping hazards from the floors. BEDROOMS  Install night lights.  Make sure a bedside light is easy to reach.  Do not use oversized bedding.  Keep a telephone by your bedside.  Have a firm chair with side arms to use for getting dressed.  Remove throw rugs and tripping hazards from the floor. KITCHEN  Keep handles on pots and pans turned toward the center of the stove. Use back burners when possible.  Clean up spills quickly and allow time for drying.  Avoid walking on wet floors.  Avoid hot utensils and knives.  Position shelves so they are not too high or low.  Place commonly used objects within easy reach.  If necessary, use a sturdy step  stool with a grab bar when reaching.  Keep electrical cables out of the way.  Do not use floor polish or wax that makes floors slippery. If you must use wax, use non-skid floor wax.  Remove throw rugs and tripping hazards from the floor. STAIRWAYS  Never leave objects on stairs.  Place handrails on both sides of stairways and use them. Fix any loose handrails. Make sure handrails on both sides of the stairways are as long as the stairs.  Check carpeting to make sure it is firmly attached along stairs. Make repairs to worn or loose carpet promptly.  Avoid placing throw rugs at  the top or bottom of stairways, or properly secure the rug with carpet tape to prevent slippage. Get rid of throw rugs, if possible.  Have an electrician put in a light switch at the top and bottom of the stairs. OTHER FALL PREVENTION TIPS  Wear low-heel or rubber-soled shoes that are supportive and fit well. Wear closed toe shoes.  When using a stepladder, make sure it is fully opened and both spreaders are firmly locked. Do not climb a closed stepladder.  Add color or contrast paint or tape to grab bars and handrails in your home. Place contrasting color strips on first and last steps.  Learn and use mobility aids as needed. Install an electrical emergency response system.  Turn on lights to avoid dark areas. Replace light bulbs that burn out immediately. Get light switches that glow.  Arrange furniture to create clear pathways. Keep furniture in the same place.  Firmly attach carpet with non-skid or double-sided tape.  Eliminate uneven floor surfaces.  Select a carpet pattern that does not visually hide the edge of steps.  Be aware of all pets. OTHER HOME SAFETY TIPS  Set the water temperature for 120 F (48.8 C).  Keep emergency numbers on or near the telephone.  Keep smoke detectors on every level of the home and near sleeping areas. Document Released: 04/08/2002 Document Revised: 10/18/2011 Document Reviewed: 07/08/2011 [facility] Patient Information 2015 [place], [place]. This information is not intended to replace advice given to you by your health care provider. Make sure you discuss any questions you have with your health care provider.   [facility] [address] [place], [place] [address] Phone # [phone] Fax [phone]

## 2016-05-27 ENCOUNTER — Encounter: Payer: Self-pay | Admitting: Physical Therapy

## 2016-05-27 ENCOUNTER — Ambulatory Visit: Payer: Medicare Other | Admitting: Physical Therapy

## 2016-05-27 DIAGNOSIS — M545 Low back pain, unspecified: Secondary | ICD-10-CM

## 2016-05-27 DIAGNOSIS — M62830 Muscle spasm of back: Secondary | ICD-10-CM | POA: Diagnosis not present

## 2016-05-27 DIAGNOSIS — G8929 Other chronic pain: Secondary | ICD-10-CM

## 2016-05-27 DIAGNOSIS — M546 Pain in thoracic spine: Secondary | ICD-10-CM

## 2016-05-27 DIAGNOSIS — M6281 Muscle weakness (generalized): Secondary | ICD-10-CM | POA: Diagnosis not present

## 2016-05-27 DIAGNOSIS — R2689 Other abnormalities of gait and mobility: Secondary | ICD-10-CM | POA: Diagnosis not present

## 2016-05-27 NOTE — Therapy (Signed)
[facility] [address] [place], [place], [address] Phone: [phone]   Fax:  [phone]  Physical Therapy Treatment  Patient Details  Name: [name] MRN: [id number] Date of Birth: [birth year] Referring Provider: Dr. Meredith Staggers  Encounter Date: 05/27/2016      PT End of Session - 05/27/16 0936    Visit Number 7   Number of Visits 10   Date for PT Re-Evaluation 06/30/16   Authorization Type g-code on 10th visit   PT Start Time 0931   PT Stop Time 1013   PT Time Calculation (min) 42 min   Activity Tolerance Patient tolerated treatment well   Behavior During Therapy [facility] for tasks assessed/performed      Past Medical History:  Diagnosis Date  . 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 Colon polyps   . Coronary atherosclerosis of unspecified type of vessel, native or graft   . Diabetes mellitus    Diet control   . Difficult intubation   . Esophageal reflux   . Gastritis, chronic   . Hypertension   . IBS (irritable bowel syndrome)   . Orthostatic hypotension    "BP has been dropping alot when I stand up for the last month or so" (02/17/2016)  . Other and unspecified hyperlipidemia   . Personal history of other diseases of digestive system   . Vitamin B 12 deficiency   . Vitamin D deficiency     Past Surgical History:  Procedure Laterality Date  . BLADDER SURGERY  1969   traumatic pelvic fractures, urethral and bladder repair  . CARDIAC CATHETERIZATION N/A 07/01/2015   Procedure: Left Heart Cath and Coronary Angiography;  Surgeon: Wellington Hampshire, MD;  Location: [place] [facility];  Service: Cardiovascular;  Laterality: N/A;  . CORONARY ANGIOPLASTY WITH STENT PLACEMENT    . CORONARY ARTERY BYPASS GRAFT N/A 07/06/2015   Procedure: CORONARY ARTERY BYPASS GRAFTING (CABG)x 4   utilizing the left internal mammary artery and endoscopically harvested bilateral  sapheneous vein.;  Surgeon: Ivin Poot, MD;  Location: [place];  Service: Open Heart Surgery;  Laterality: N/A;  . KNEE SURGERY    . TEE WITHOUT CARDIOVERSION N/A 07/06/2015   Procedure: TRANSESOPHAGEAL ECHOCARDIOGRAM (TEE);  Surgeon: Ivin Poot, MD;  Location: [place];  Service: Open Heart Surgery;  Laterality: N/A;    There were no vitals filed for this visit.      Subjective Assessment - 05/27/16 0935    Subjective I'm doing good, worked in the yard all day yesterday.   Limitations Standing   Patient Stated Goals reduce back pain, increase strength for prior function, walking 2 miles per day,    Currently in Pain? Yes   Pain Score 2    Pain Location Back   Pain Orientation Lower   Pain Descriptors / Indicators Aching   Pain Type Chronic pain   Pain Radiating Towards none   Pain Onset More than a month ago   Pain Frequency Intermittent                         [facility] Adult PT Treatment/Exercise - 05/27/16 0001      Lumbar Exercises: Stretches   Active Hamstring Stretch 2 reps;10 seconds  seated   Single Knee to Chest Stretch --  forward lunge on stairs     Lumbar Exercises: Aerobic   Stationary Bike Level 3 x 8 minutes  PT present to discuss progress     Lumbar Exercises: Standing   Other Standing Lumbar Exercises Side stepping  breaks for dizzyness     Lumbar Exercises: Seated   Sit to Stand 20 reps  on blue foam     Lumbar Exercises: Supine   Bridge 20 reps   Other Supine Lumbar Exercises Short arc quad  #5 x20 Bil   Other Supine Lumbar Exercises Ball squeeze  3 second holds     Knee/Hip Exercises: Stretches   Gastroc Stretch 2 reps;10 seconds     Knee/Hip Exercises: Machines for Strengthening   Cybex Leg Press 3x10 #70  seat 8     Knee/Hip Exercises: Standing   Forward Step Up 2 sets;10 reps;Hand Hold: 2     Shoulder Exercises: Supine   Horizontal ABduction Strengthening;Both;15 reps;Theraband   Theraband Level (Shoulder Horizontal ABduction) Level 2 (Red)    External Rotation Strengthening;Both;15 reps;Theraband   Theraband Level (Shoulder External Rotation) Level 2 (Red)   Flexion Strengthening;15 reps;Theraband  With cane   Theraband Level (Shoulder Flexion) Level 2 (Red)   Other Supine Exercises Extension 20  green band; hooklying                  PT Short Term Goals - 05/24/16 [id number]      PT SHORT TERM GOAL #1   Title independent with initial HEP   Status Achieved     PT SHORT TERM GOAL #2   Title understand correct body mechanics with ADL's to reduce strain on his back   Status Achieved     PT SHORT TERM GOAL #3   Title understand tips to reduce falls due to him being dizzy since his by-pass surgery   Status Achieved     PT SHORT TERM GOAL #4   Title back pain with walking and standing decreased >/= 25% due to standing up straighter and increased trunk strength   Time 4   Period Weeks   Status On-going           PT Long Term Goals - 05/17/16 1404      PT LONG TERM GOAL #1   Title independent with HEP   Time 8   Period Weeks   Status On-going     PT LONG TERM GOAL #2   Title walk 1 mile at the park with 2-3 rests with SPC due to back pain decreased >/= 50%   Time 8   Period Weeks   Status On-going     PT LONG TERM GOAL #3   Title trunk extensor strength has improved so patient is able to walk in the community with upright posture and no verbal cues from wife   Time 8   Period Weeks   Status On-going     PT LONG TERM GOAL #4   Title sit to stand </= 14 sec due to improved balance   Time 8   Period Weeks   Status On-going     PT LONG TERM GOAL #5   Title get up from the floor after working on the car with >/= 50% greater ease due to increase strength and mobility   Time 8   Period Weeks   Status On-going     PT LONG TERM GOAL #6   Title FOTO score is </= 43% limitation   Time 8   Period Weeks   Status On-going               Plan -  05/27/16 1014    Clinical Impression Statement  Pt continues to progress with strength and balance, limited by dizzyness. Pt monitored for dizzyness thorughout treatment. Pt able to tolerate all exercsies well with no increase in pain. Pt will continue to benefit from skilled therapy for LE strengthening.    Rehab Potential Good   Clinical Impairments Affecting Rehab Potential Heart bypass surgery 07/06/2015 and has been dizzy since then   PT Frequency 2x / week   PT Duration 8 weeks   PT Treatment/Interventions Cryotherapy;Electrical Stimulation;Gait training;Ultrasound;Moist Heat;Therapeutic activities;Therapeutic exercise;Balance training;Neuromuscular re-education;Patient/family education;Passive range of motion;Scar mobilization;Manual techniques;Energy conservation   PT Next Visit Plan posture and core strength, balance as tolerated, LE strength   Family Member Consulted Patient      Patient will benefit from skilled therapeutic intervention in order to improve the following deficits and impairments:  Abnormal gait, Decreased range of motion, Difficulty walking, Increased fascial restricitons, Decreased endurance, Increased muscle spasms, Dizziness, Decreased activity tolerance, Pain, Decreased balance, Impaired flexibility, Decreased strength, Decreased mobility  Visit Diagnosis: Muscle weakness (generalized)  Other abnormalities of gait and mobility  Pain in thoracic spine  Chronic right-sided low back pain without sciatica  Muscle spasm of back     Problem List Patient Active Problem List   Diagnosis Date Noted  . Idiopathic scoliosis 03/22/2016  . Diabetic neuropathy ([place]) 03/22/2016  . Dysautonomia orthostatic hypotension syndrome ([place]) 02/25/2016  . CKD (chronic kidney disease), stage III 02/19/2016  . Neuropathic pain of foot 02/18/2016  . Coronary artery disease involving coronary bypass graft of native heart without angina pectoris   . Diabetes mellitus type 2, diet-controlled ([place])   . Intercostal neuralgia  12/24/2015  . Medication management 08/11/2015  . Orthostatic hypotension 07/24/2015  . 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 NSTEMI (non-ST elevated myocardial infarction) ([place]) 07/01/2015  . BMI 25.0-25.9,adult 02/02/2015  . PVD (peripheral vascular disease) ([place]) 01/01/2014  . Mixed hyperlipidemia 04/11/2013  . Prediabetes 04/11/2013  . Supine hypertension   . Vitamin D deficiency   . Vitamin B 12 deficiency   . Coronary atherosclerosis- s/p PCI to LAD in 2009 and PCI to RCA in 2011 02/27/2009  . Abdominal aortic aneurysm ([place]) 02/27/2009  . GERD 02/27/2009  . History of IBS 02/27/2009    Mikle Bosworth PTA 05/27/2016, 10:26 AM  [facility] [address], [place], [place], [address] Phone: [phone]   Fax:  [phone]  Name: [name] MRN: [id number] Date of Birth: 1933-07-04

## 2016-05-31 ENCOUNTER — Ambulatory Visit (INDEPENDENT_AMBULATORY_CARE_PROVIDER_SITE_OTHER): Payer: Medicare Other | Admitting: Internal Medicine

## 2016-05-31 ENCOUNTER — Ambulatory Visit: Payer: Medicare Other

## 2016-05-31 ENCOUNTER — Encounter: Payer: Self-pay | Admitting: Internal Medicine

## 2016-05-31 VITALS — BP 146/78 | HR 72 | Temp 97.3°F | Resp 16 | Ht 72.5 in | Wt 204.2 lb

## 2016-05-31 DIAGNOSIS — G8929 Other chronic pain: Secondary | ICD-10-CM | POA: Diagnosis not present

## 2016-05-31 DIAGNOSIS — I10 Essential (primary) hypertension: Secondary | ICD-10-CM

## 2016-05-31 DIAGNOSIS — C44609 Unspecified malignant neoplasm of skin of left upper limb, including shoulder: Secondary | ICD-10-CM

## 2016-05-31 DIAGNOSIS — M545 Low back pain, unspecified: Secondary | ICD-10-CM

## 2016-05-31 DIAGNOSIS — G903 Multi-system degeneration of the autonomic nervous system: Secondary | ICD-10-CM | POA: Diagnosis not present

## 2016-05-31 DIAGNOSIS — R2689 Other abnormalities of gait and mobility: Secondary | ICD-10-CM

## 2016-05-31 DIAGNOSIS — M546 Pain in thoracic spine: Secondary | ICD-10-CM

## 2016-05-31 DIAGNOSIS — D0462 Carcinoma in situ of skin of left upper limb, including shoulder: Secondary | ICD-10-CM | POA: Diagnosis not present

## 2016-05-31 DIAGNOSIS — I951 Orthostatic hypotension: Secondary | ICD-10-CM

## 2016-05-31 DIAGNOSIS — M6281 Muscle weakness (generalized): Secondary | ICD-10-CM | POA: Diagnosis not present

## 2016-05-31 DIAGNOSIS — M62830 Muscle spasm of back: Secondary | ICD-10-CM | POA: Diagnosis not present

## 2016-05-31 NOTE — Patient Instructions (Addendum)
KNEE: Extension, Long Arc Quad (Weight)  Place weight around leg. Raise leg until knee is straight. Hold _5__ seconds. Use ___ lb weight. _10__ reps per set (each leg), 4-5__ sets per day, __7_ days per week  Copyright  VHI. All rights reserved.      Knee Raise   Lift knee and then lower it. Repeat with other knee. Repeat _10__ times each leg. Do _4-5___ sessions per day.  [email or web address] [address] [place], [place] [address] Phone # [phone] Fax [phone]

## 2016-05-31 NOTE — Progress Notes (Signed)
[facility]   [name], M.D.    Uvaldo Bristle. Silverio Lay, P.A.-C      Starlyn Skeans, P.A.-C  [facility]                [address] [place] [id number] Telephone [phone] Telefax [phone]  Subjective:    Patient ID: [name], male    DOB: 24-Apr-1934, 83 y.o.   MRN: [id number]  HPI   This very nice 83 yo MWM with hx/o severe labile HTN complicated by Dysautonomia and postural hypotension has ongoing titration by his wife of his Florinef to maintaining BP's albeit sometimes elevated to avoid the severe Hypotension that he's experienced off of the Florinef being used to expand his vascular spaces and coincidentally he has been off of his hypertensives  (Atenolol) since Aug 2017. Also discontinuing his low dose Amitriptyline 10 mg  Has seemed to help stabilize his postural orthostasis. Patient also is noted to have a suspicious lesion on the dorsal Lt hand suspect for a skin cancer  Medication Sig  . aspirin EC 81 MG EC tablet Take 1 tablet (81 mg total) by mouth daily.  Marland Kitchen CALCIUM PO Take 1 tablet by mouth daily.  . Cholecalciferol (VITAMIN D PO) Take 5,000 Units by mouth 2 (two) times daily.  . cyanocobalamin 1000 MCG tablet Take 1,000 mcg by mouth daily.  . diclofenac sodium (VOLTAREN) 1 % GEL Apply 1 application topically 3 (three) times daily. Ribs, back.  . Flaxseed, Linseed, 1000 MG CAPS Take 1,300 mg by mouth daily.   . fludrocortisone (FLORINEF) 0.1 MG tablet Take 2 tablets (200 mcg total) by mouth daily.  . fluticasone (FLONASE) 50 MCG/ACT nasal spray SHAKE LQ AND U 2 SPRAYS IEN D  . gabapentin (NEURONTIN) 100 MG capsule TAKE 1 CAPSULE(100 MG) BY MOUTH THREE TIMES DAILY  . lidocaine (XYLOCAINE) 5 % ointment Apply 1 application topically as needed. To ribs, back  . Magnesium 250 MG TABS Take 500 mg by mouth daily.   . multivitamin (THERAGRAN) per tablet Take 1 tablet by mouth  daily.    Marland Kitchen omeprazole (PRILOSEC) 20 MG capsule TAKE 2 CAPSULES BY MOUTH EVERY DAY FOR INDIGESTION  . diazepam (VALIUM) 5 MG tablet Take 1/2 to 1 tablet 2 - 3 x /day for neck pain / Headache (Patient not taking: Reported on 05/31/2016)  . metoCLOPramide (REGLAN) 10 MG tablet Take 1 tablet (10 mg total) by mouth every 6 (six) hours as needed for nausea (nausea/headache). (Patient not taking: Reported on 05/31/2016)   Allergies  Allergen Reactions  . Cymbalta [Duloxetine Hcl]     Dizziness  . Keflex [Cephalexin] Other (See Comments)    Reaction: unknown   . Simvastatin Other (See Comments)    Reaction: unknown   . Sudafed [Pseudoephedrine]     Dizziness   Past Medical History:  Diagnosis Date  . 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 Colon polyps   . Coronary atherosclerosis of unspecified type of vessel, native or graft   . Diabetes mellitus    Diet control   . Difficult intubation   . Esophageal reflux   . Gastritis, chronic   . Hypertension   . IBS (irritable bowel syndrome)   . Orthostatic hypotension    "BP has been dropping alot when I stand up for the last month or so" (02/17/2016)  .  Other and unspecified hyperlipidemia   . Personal history of other diseases of digestive system   . Vitamin B 12 deficiency   . Vitamin D deficiency    Past Surgical History:  Procedure Laterality Date  . BLADDER SURGERY  1969   traumatic pelvic fractures, urethral and bladder repair  . CARDIAC CATHETERIZATION N/A 07/01/2015   Procedure: Left Heart Cath and Coronary Angiography;  Surgeon: Wellington Hampshire, MD;  Location: [place] [facility];  Service: Cardiovascular;  Laterality: N/A;  . CORONARY ANGIOPLASTY WITH STENT PLACEMENT    . CORONARY ARTERY BYPASS GRAFT N/A 07/06/2015   Procedure: CORONARY ARTERY BYPASS GRAFTING (CABG)x 4   utilizing the left internal mammary artery and endoscopically harvested bilateral  sapheneous vein.;  Surgeon: Ivin Poot, MD;  Location: [place];  Service: Open  Heart Surgery;  Laterality: N/A;  . KNEE SURGERY    . TEE WITHOUT CARDIOVERSION N/A 07/06/2015   Procedure: TRANSESOPHAGEAL ECHOCARDIOGRAM (TEE);  Surgeon: Ivin Poot, MD;  Location: [place];  Service: Open Heart Surgery;  Laterality: N/A;   Review of Systems  10 point systems review negative except as above.    Objective:   Physical Exam  BP (!) 146/78   Pulse 72   Temp 97.3 F (36.3 C)   Resp 16   Ht 6' 0.5" (1.842 m)   Wt 204 lb 3.2 oz (92.6 kg)   BMI 27.31 kg/m   HEENT - Eac's patent. TM's Nl. EOM's full. PERRLA. NasoOroPharynx clear. Neck - supple. Nl Thyroid. Carotids 2+ & No bruits, nodes, JVD Chest - Clear equal BS w/o Rales, rhonchi, wheezes. Cor - Nl HS. RRR w/o sig MGR. PP 1(+). No edema. MS- FROM w/o deformities. Muscle power, tone and bulk Nl. Gait Nl. Neuro - No obvious Cr N abnormalities.  Nl w/o focal abnormalities.  Skin - there is a 12 mm x 12 mm raised firm pink nodular lesion with central umbilication "capped" with a dry scab on the mid dorsal Left Hand.  Procedure - (CPT : P707613) - After informed consent and aseptic prep with alcohol and local anesthesia with 2.0 ml Marcaine 0.5%,  the above lesion was gently sharply excised in an elliptical fashion with a #10 scalpel full skin thickness to carefully avoid disruption of deeper structures as tendons or neurovascular bundles. Then the opposing skin edges were approximated & aligned with # 3 mattress sutures of Nylon 4-0. Hen the wound was painted with "Newskin" and a 2" x 3" Tegaderm was applied. Patient was instructed in post -excision and asked to return in 1 week or sooner if needed.   Lesion was sent for path analysis.    Assessment & Plan:   1. Supine hypertension   2. Dysautonomia orthostatic hypotension syndrome (HCC)   3. Primary malignant neoplasm of skin of left hand  - Dermatology pathology

## 2016-05-31 NOTE — Therapy (Addendum)
[facility] [address], [place], [place], [address] Phone: [phone]   Fax:  [phone]  Physical Therapy Treatment  Patient Details  Name: [name] MRN: [id number] Date of Birth: 14-Nov-1933 Referring Provider: Dr. Meredith Staggers  Encounter Date: 05/31/2016      PT End of Session - 05/31/16 1013    Visit Number 8   Number of Visits 10   Date for PT Re-Evaluation 06/30/16   Authorization Type g-code on 10th visit   PT Start Time 0930   PT Stop Time 1012   PT Time Calculation (min) 42 min   Activity Tolerance Patient tolerated treatment well   Behavior During Therapy [facility] for tasks assessed/performed      Past Medical History:  Diagnosis Date  . 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 Colon polyps   . Coronary atherosclerosis of unspecified type of vessel, native or graft   . Diabetes mellitus    Diet control   . Difficult intubation   . Esophageal reflux   . Gastritis, chronic   . Hypertension   . IBS (irritable bowel syndrome)   . Orthostatic hypotension    "BP has been dropping alot when I stand up for the last month or so" (02/17/2016)  . Other and unspecified hyperlipidemia   . Personal history of other diseases of digestive system   . Vitamin B 12 deficiency   . Vitamin D deficiency     Past Surgical History:  Procedure Laterality Date  . BLADDER SURGERY  1969   traumatic pelvic fractures, urethral and bladder repair  . CARDIAC CATHETERIZATION N/A 07/01/2015   Procedure: Left Heart Cath and Coronary Angiography;  Surgeon: Wellington Hampshire, MD;  Location: [place] [facility];  Service: Cardiovascular;  Laterality: N/A;  . CORONARY ANGIOPLASTY WITH STENT PLACEMENT    . CORONARY ARTERY BYPASS GRAFT N/A 07/06/2015   Procedure: CORONARY ARTERY BYPASS GRAFTING (CABG)x 4   utilizing the left internal mammary artery and endoscopically harvested bilateral  sapheneous vein.;  Surgeon: Ivin Poot, MD;  Location: [place];  Service: Open Heart Surgery;  Laterality: N/A;  . KNEE SURGERY    . TEE WITHOUT CARDIOVERSION N/A 07/06/2015   Procedure: TRANSESOPHAGEAL ECHOCARDIOGRAM (TEE);  Surgeon: Ivin Poot, MD;  Location: [place];  Service: Open Heart Surgery;  Laterality: N/A;    There were no vitals filed for this visit.      Subjective Assessment - 05/31/16 0940    Subjective I feel tired today. Still getting dizzy.   Currently in Pain? Yes   Pain Score 2    Pain Location Back   Pain Orientation Lower   Pain Descriptors / Indicators Aching   Pain Type Chronic pain   Pain Onset More than a month ago   Pain Frequency Intermittent   Aggravating Factors  walking, standing, early morning   Pain Relieving Factors sitting, moving around in the morning                         [facility] Adult PT Treatment/Exercise - 05/31/16 0001      Lumbar Exercises: Stretches   Active Hamstring Stretch 10 seconds;3 reps  seated     Lumbar Exercises: Supine   Bridge 20 reps   Other Supine Lumbar Exercises Ball squeeze  3 second holds     Knee/Hip Exercises: Aerobic   Nustep Level 2 x 10 minutes  PT present  to discuss progress     Knee/Hip Exercises: Machines for Strengthening   Cybex Leg Press 3x10 70#  seat 8     Knee/Hip Exercises: Standing   Hip Abduction Stengthening;Both;2 sets;10 reps   Hip Extension Stengthening;Both;2 sets;10 reps   Forward Step Up --     Knee/Hip Exercises: Seated   Long Arc Quad Strengthening;Both;2 sets;10 reps   Knee/Hip Flexion marching: 2x10 bil.     Shoulder Exercises: Supine   Horizontal ABduction Strengthening;Both;15 reps;Theraband   Theraband Level (Shoulder Horizontal ABduction) Level 2 (Red)   External Rotation Strengthening;Both;15 reps;Theraband   Theraband Level (Shoulder External Rotation) Level 2 (Red)                PT Education - 05/31/16 0951    Education provided Yes   Education Details long arc quads  and seated marching   Person(s) Educated Patient   Methods Explanation;Demonstration;Handout   Comprehension Verbalized understanding;Returned demonstration          PT Short Term Goals - 05/31/16 1025      PT SHORT TERM GOAL #4   Title back pain with walking and standing decreased >/= 25% due to standing up straighter and increased trunk strength   Time 4   Period Weeks   Status On-going           PT Long Term Goals - 05/31/16 1025      PT LONG TERM GOAL #1   Title independent with HEP   Time 8   Period Weeks   Status On-going     PT LONG TERM GOAL #2   Title walk 1 mile at the park with 2-3 rests with [facility] due to back pain decreased >/= 50%   Time 8   Period Weeks   Status On-going               Plan - 05/31/16 0944    Clinical Impression Statement Pt continues to progress with strength and balance.  Treatment is limited by dizziness and was monitored for safety throughout.  Pt able to tolerate all strength exercises today without difficulty.  Pt will continue to benefit from skilled PT for strength, endurance, gait and balance exercise to improve safety in the community.     Rehab Potential Good   Clinical Impairments Affecting Rehab Potential Heart bypass surgery 07/06/2015 and has been dizzy since then   PT Frequency 2x / week   PT Duration 8 weeks   PT Treatment/Interventions Cryotherapy;Electrical Stimulation;Gait training;Ultrasound;Moist Heat;Therapeutic activities;Therapeutic exercise;Balance training;Neuromuscular re-education;Patient/family education;Passive range of motion;Scar mobilization;Manual techniques;Energy conservation   PT Next Visit Plan posture and core strength, balance as tolerated, LE strength.  Test 5x sit to stand to assess long term goal.     Consulted and Agree with Plan of Care Patient      Patient will benefit from skilled therapeutic intervention in order to improve the following deficits and impairments:  Abnormal gait,  Decreased range of motion, Difficulty walking, Increased fascial restricitons, Decreased endurance, Increased muscle spasms, Dizziness, Decreased activity tolerance, Pain, Decreased balance, Impaired flexibility, Decreased strength, Decreased mobility  Visit Diagnosis: Muscle weakness (generalized)  Other abnormalities of gait and mobility  Pain in thoracic spine  Chronic right-sided low back pain without sciatica     Problem List Patient Active Problem List   Diagnosis Date Noted  . Idiopathic scoliosis 03/22/2016  . Diabetic neuropathy ([place]) 03/22/2016  . Dysautonomia orthostatic hypotension syndrome ([place]) 02/25/2016  . CKD (chronic kidney disease), stage III 02/19/2016  .  Neuropathic pain of foot 02/18/2016  . Coronary artery disease involving coronary bypass graft of native heart without angina pectoris   . Diabetes mellitus type 2, diet-controlled ([place])   . 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 Medication management 08/11/2015  . Orthostatic hypotension 07/24/2015  . 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 NSTEMI (non-ST elevated myocardial infarction) ([place]) 07/01/2015  . BMI 25.0-25.9,adult 02/02/2015  . PVD (peripheral vascular disease) ([place]) 01/01/2014  . Mixed hyperlipidemia 04/11/2013  . Prediabetes 04/11/2013  . Supine hypertension   . Vitamin D deficiency   . Vitamin B 12 deficiency   . Coronary atherosclerosis- s/p PCI to LAD in 2009 and PCI to RCA in 2011 02/27/2009  . Abdominal aortic aneurysm ([place]) 02/27/2009  . GERD 02/27/2009  . History of IBS 02/27/2009     Sigurd Sos, PT 05/31/16 10:26 AM  [facility] [address] [place], [place], [address] Phone: [phone]   Fax:  [phone]  Name: [name] MRN: [id number] Date of Birth: 02-14-1934

## 2016-06-01 ENCOUNTER — Other Ambulatory Visit: Payer: Self-pay | Admitting: Internal Medicine

## 2016-06-01 DIAGNOSIS — I714 Abdominal aortic aneurysm, without rupture, unspecified: Secondary | ICD-10-CM

## 2016-06-03 ENCOUNTER — Encounter: Payer: Self-pay | Admitting: Physical Therapy

## 2016-06-03 ENCOUNTER — Ambulatory Visit: Payer: Medicare Other | Attending: Physical Medicine & Rehabilitation | Admitting: Physical Therapy

## 2016-06-03 DIAGNOSIS — M545 Low back pain, unspecified: Secondary | ICD-10-CM

## 2016-06-03 DIAGNOSIS — M62830 Muscle spasm of back: Secondary | ICD-10-CM | POA: Insufficient documentation

## 2016-06-03 DIAGNOSIS — G8929 Other chronic pain: Secondary | ICD-10-CM | POA: Insufficient documentation

## 2016-06-03 DIAGNOSIS — R2689 Other abnormalities of gait and mobility: Secondary | ICD-10-CM | POA: Diagnosis not present

## 2016-06-03 DIAGNOSIS — M6281 Muscle weakness (generalized): Secondary | ICD-10-CM | POA: Diagnosis not present

## 2016-06-03 DIAGNOSIS — M546 Pain in thoracic spine: Secondary | ICD-10-CM | POA: Insufficient documentation

## 2016-06-03 NOTE — Therapy (Signed)
[facility] [address], [place], [place], [address] Phone: [phone]   Fax:  [phone]  Physical Therapy Treatment  Patient Details  Name: [name] MRN: [id number] Date of Birth: [birth year] Referring Provider: Dr. Meredith Staggers  Encounter Date: 06/03/2016      PT End of Session - 06/03/16 0935    Visit Number 9   Number of Visits 10   Date for PT Re-Evaluation 06/30/16   Authorization Type g-code on 10th visit   PT Start Time 0930   PT Stop Time 1008   PT Time Calculation (min) 38 min   Activity Tolerance Patient tolerated treatment well   Behavior During Therapy [facility] for tasks assessed/performed      Past Medical History:  Diagnosis Date  . 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 Colon polyps   . Coronary atherosclerosis of unspecified type of vessel, native or graft   . Diabetes mellitus    Diet control   . Difficult intubation   . Esophageal reflux   . Gastritis, chronic   . Hypertension   . IBS (irritable bowel syndrome)   . Orthostatic hypotension    "BP has been dropping alot when I stand up for the last month or so" (02/17/2016)  . Other and unspecified hyperlipidemia   . Personal history of other diseases of digestive system   . Vitamin B 12 deficiency   . Vitamin D deficiency     Past Surgical History:  Procedure Laterality Date  . BLADDER SURGERY  1969   traumatic pelvic fractures, urethral and bladder repair  . CARDIAC CATHETERIZATION N/A 07/01/2015   Procedure: Left Heart Cath and Coronary Angiography;  Surgeon: Wellington Hampshire, MD;  Location: [place] [facility];  Service: Cardiovascular;  Laterality: N/A;  . CORONARY ANGIOPLASTY WITH STENT PLACEMENT    . CORONARY ARTERY BYPASS GRAFT N/A 07/06/2015   Procedure: CORONARY ARTERY BYPASS GRAFTING (CABG)x 4   utilizing the left internal mammary artery and endoscopically harvested bilateral  sapheneous vein.;  Surgeon: Ivin Poot,  MD;  Location: [place];  Service: Open Heart Surgery;  Laterality: N/A;  . KNEE SURGERY    . TEE WITHOUT CARDIOVERSION N/A 07/06/2015   Procedure: TRANSESOPHAGEAL ECHOCARDIOGRAM (TEE);  Surgeon: Ivin Poot, MD;  Location: [place];  Service: Open Heart Surgery;  Laterality: N/A;    There were no vitals filed for this visit.      Subjective Assessment - 06/03/16 0934    Subjective Not a good day. Very dizzy today. Back pain is the same except it's on the left side today, not the right.   Pertinent History dizzy; sometimes legs give way   Limitations Standing   Patient Stated Goals reduce back pain, increase strength for prior function, walking 2 miles per day,    Currently in Pain? Yes   Pain Score 2    Pain Location Back   Pain Orientation Lower   Pain Descriptors / Indicators Aching   Pain Type Chronic pain   Pain Radiating Towards none   Pain Onset More than a month ago   Pain Frequency Intermittent                         [facility] Adult PT Treatment/Exercise - 06/03/16 0001      Lumbar Exercises: Aerobic   Stationary Bike Nustep L2 x 10 minutes  Seat 12 arms 11; therapist present to discuss treatment  Lumbar Exercises: Seated   Long Arc Quad on Chair Strengthening;2 sets;10 reps  3#   Sit to Stand 5 reps  For long term goal     Lumbar Exercises: Supine   Heel Slides 20 reps  towel under foot   Bridge 20 reps   Other Supine Lumbar Exercises Ball squeeze  3 second holds     Shoulder Exercises: Supine   Horizontal ABduction Strengthening;Both;15 reps;Theraband   External Rotation Strengthening;Both;15 reps;Theraband   Theraband Level (Shoulder External Rotation) Level 2 (Red)   Other Supine Exercises Extension 20  green band; hooklying                  PT Short Term Goals - 05/31/16 1025      PT SHORT TERM GOAL #4   Title back pain with walking and standing decreased >/= 25% due to standing up straighter and increased trunk strength    Time 4   Period Weeks   Status On-going           PT Long Term Goals - 06/03/16 1002      PT LONG TERM GOAL #4   Title sit to stand </= 14 sec due to improved balance   Baseline 25 seconds   Time 8   Period Weeks   Status On-going               Plan - 06/03/16 0953    Clinical Impression Statement Pt reports feeling very dizzy today. Minimalized position changed and standing exercises to prevent further dizziness or falls. Pt continues to need increased time for 5 times sit to stand. Pt able to tolerate all seated and supine exercises well. Pt continues to progress with strength. Limtations to progress include pt dizziness. Pt will continue to benefit from skilled therapy for core and postural strengthening.    Rehab Potential Good   Clinical Impairments Affecting Rehab Potential Heart bypass surgery 07/06/2015 and has been dizzy since then   PT Frequency 2x / week   PT Duration 8 weeks   PT Treatment/Interventions Cryotherapy;Electrical Stimulation;Gait training;Ultrasound;Moist Heat;Therapeutic activities;Therapeutic exercise;Balance training;Neuromuscular re-education;Patient/family education;Passive range of motion;Scar mobilization;Manual techniques;Energy conservation   PT Next Visit Plan posture and core strength, balance as tolerated, LE strength.  Test 5x sit to stand to assess long term goal.     Family Member Consulted Patient      Patient will benefit from skilled therapeutic intervention in order to improve the following deficits and impairments:  Abnormal gait, Decreased range of motion, Difficulty walking, Increased fascial restricitons, Decreased endurance, Increased muscle spasms, Dizziness, Decreased activity tolerance, Pain, Decreased balance, Impaired flexibility, Decreased strength, Decreased mobility  Visit Diagnosis: Muscle weakness (generalized)  Other abnormalities of gait and mobility  Pain in thoracic spine  Chronic right-sided low back pain  without sciatica  Muscle spasm of back     Problem List Patient Active Problem List   Diagnosis Date Noted  . Idiopathic scoliosis 03/22/2016  . Diabetic neuropathy ([place]) 03/22/2016  . Dysautonomia orthostatic hypotension syndrome ([place]) 02/25/2016  . CKD (chronic kidney disease), stage III 02/19/2016  . Neuropathic pain of foot 02/18/2016  . Coronary artery disease involving coronary bypass graft of native heart without angina pectoris   . Diabetes mellitus type 2, diet-controlled ([place])   . 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 Medication management 08/11/2015  . Orthostatic hypotension 07/24/2015  . 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 NSTEMI (non-ST elevated myocardial infarction) ([place]) 07/01/2015  . BMI 25.0-25.9,adult 02/02/2015  .  PVD (peripheral vascular disease) ([place]) 01/01/2014  . Mixed hyperlipidemia 04/11/2013  . Prediabetes 04/11/2013  . Supine hypertension   . Vitamin D deficiency   . Vitamin B 12 deficiency   . Coronary atherosclerosis- s/p PCI to LAD in 2009 and PCI to RCA in 2011 02/27/2009  . Abdominal aortic aneurysm ([place]) 02/27/2009  . GERD 02/27/2009  . History of IBS 02/27/2009    Mikle Bosworth PTA 06/03/2016, 10:08 AM  [facility] [address], [place], [place], [address] Phone: [phone]   Fax:  [phone]  Name: [name] MRN: [id number] Date of Birth: July 13, 1933

## 2016-06-07 ENCOUNTER — Ambulatory Visit (INDEPENDENT_AMBULATORY_CARE_PROVIDER_SITE_OTHER): Payer: Medicare Other | Admitting: Internal Medicine

## 2016-06-07 ENCOUNTER — Ambulatory Visit: Payer: Medicare Other

## 2016-06-07 ENCOUNTER — Ambulatory Visit: Payer: Self-pay | Admitting: Internal Medicine

## 2016-06-07 DIAGNOSIS — M6281 Muscle weakness (generalized): Secondary | ICD-10-CM

## 2016-06-07 DIAGNOSIS — R2689 Other abnormalities of gait and mobility: Secondary | ICD-10-CM

## 2016-06-07 DIAGNOSIS — C44609 Unspecified malignant neoplasm of skin of left upper limb, including shoulder: Secondary | ICD-10-CM

## 2016-06-07 DIAGNOSIS — M546 Pain in thoracic spine: Secondary | ICD-10-CM

## 2016-06-07 DIAGNOSIS — G8929 Other chronic pain: Secondary | ICD-10-CM | POA: Diagnosis not present

## 2016-06-07 DIAGNOSIS — M545 Low back pain, unspecified: Secondary | ICD-10-CM

## 2016-06-07 DIAGNOSIS — M62830 Muscle spasm of back: Secondary | ICD-10-CM | POA: Diagnosis not present

## 2016-06-07 DIAGNOSIS — M5481 Occipital neuralgia: Secondary | ICD-10-CM | POA: Diagnosis not present

## 2016-06-07 MED ORDER — PREDNISONE 20 MG PO TABS: ORAL_TABLET | ORAL | 0 refills | Status: DC

## 2016-06-07 NOTE — Therapy (Signed)
[facility] [address], [place], [place], [address] Phone: [phone]   Fax:  [phone]  Physical Therapy Treatment  Patient Details  Name: [name] MRN: [id number] Date of Birth: Dec 29, 1933 Referring Provider: Dr. Meredith Staggers  Encounter Date: 06/07/2016      PT End of Session - 06/07/16 1008    Visit Number 10   Number of Visits 20   Date for PT Re-Evaluation 06/30/16   Authorization Type G-code on 20th visit   PT Start Time 0930   PT Stop Time 1010   PT Time Calculation (min) 40 min   Activity Tolerance Patient tolerated treatment well   Behavior During Therapy [facility] for tasks assessed/performed      Past Medical History:  Diagnosis Date  . 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 Colon polyps   . Coronary atherosclerosis of unspecified type of vessel, native or graft   . Diabetes mellitus    Diet control   . Difficult intubation   . Esophageal reflux   . Gastritis, chronic   . Hypertension   . IBS (irritable bowel syndrome)   . Orthostatic hypotension    "BP has been dropping alot when I stand up for the last month or so" (02/17/2016)  . Other and unspecified hyperlipidemia   . Personal history of other diseases of digestive system   . Vitamin B 12 deficiency   . Vitamin D deficiency     Past Surgical History:  Procedure Laterality Date  . BLADDER SURGERY  1969   traumatic pelvic fractures, urethral and bladder repair  . CARDIAC CATHETERIZATION N/A 07/01/2015   Procedure: Left Heart Cath and Coronary Angiography;  Surgeon: Wellington Hampshire, MD;  Location: [place] [facility];  Service: Cardiovascular;  Laterality: N/A;  . CORONARY ANGIOPLASTY WITH STENT PLACEMENT    . CORONARY ARTERY BYPASS GRAFT N/A 07/06/2015   Procedure: CORONARY ARTERY BYPASS GRAFTING (CABG)x 4   utilizing the left internal mammary artery and endoscopically harvested bilateral  sapheneous vein.;  Surgeon: Ivin Poot, MD;  Location: [place];  Service: Open Heart Surgery;  Laterality: N/A;  . KNEE SURGERY    . TEE WITHOUT CARDIOVERSION N/A 07/06/2015   Procedure: TRANSESOPHAGEAL ECHOCARDIOGRAM (TEE);  Surgeon: Ivin Poot, MD;  Location: [place];  Service: Open Heart Surgery;  Laterality: N/A;    There were no vitals filed for this visit.      Subjective Assessment - 06/07/16 0936    Subjective I've not been feeling good the past few days.     Patient Stated Goals reduce back pain, increase strength for prior function, walking 2 miles per day,    Currently in Pain? Yes   Pain Score 4    Pain Location Back   Pain Orientation Lower   Pain Descriptors / Indicators Aching   Pain Type Chronic pain   Pain Onset More than a month ago   Pain Frequency Intermittent   Aggravating Factors  walking, standing, early morning   Pain Relieving Factors sitting, moving around in the morning            [facility] PT Assessment - 06/07/16 0001      Observation/Other Assessments   Focus on Therapeutic Outcomes (FOTO)  53% limitation     Standardized Balance Assessment   Five times sit to stand comments  26 seconds  [place] Adult PT Treatment/Exercise - 06/07/16 0001      Lumbar Exercises: Aerobic   Stationary Bike Nustep L2 x 10 minutes  Seat 12 arms 11; therapist present to discuss treatment     Lumbar Exercises: Seated   Long Arc Quad on Chair Strengthening;2 sets;10 reps  3#     Lumbar Exercises: Supine   Bridge 20 reps   Other Supine Lumbar Exercises Ball squeeze  3 second holds     Knee/Hip Exercises: Aerobic   Nustep Level 2 x 10 minutes  PT present to discuss progress     Knee/Hip Exercises: Standing   Hip Abduction Stengthening;Both;2 sets;10 reps   Abduction Limitations 3# added   Hip Extension Stengthening;Both;2 sets;10 reps   Extension Limitations 3# added     Knee/Hip Exercises: Seated   Knee/Hip Flexion marching: 2x10 bil.   Other Seated  Knee/Hip Exercises 3# added     Shoulder Exercises: Supine   Horizontal ABduction Strengthening;Both;15 reps;Theraband   Theraband Level (Shoulder Horizontal ABduction) Level 3 (Green)   External Rotation Strengthening;Both;15 reps;Theraband   Theraband Level (Shoulder External Rotation) Level 3 (Green)                  PT Short Term Goals - 06/07/16 [id number]      PT SHORT TERM GOAL #4   Title back pain with walking and standing decreased >/= 25% due to standing up straighter and increased trunk strength   Time 4   Period Weeks   Status On-going           PT Long Term Goals - 06/07/16 [id number]      PT LONG TERM GOAL #1   Title independent with HEP   Time 8   Period Weeks   Status On-going     PT LONG TERM GOAL #2   Title walk 1 mile at the park with 2-3 rests with SPC due to back pain decreased >/= 50%   Time 8   Period Weeks   Status On-going     PT LONG TERM GOAL #3   Title trunk extensor strength has improved so patient is able to walk in the community with upright posture and no verbal cues from wife   Time 8   Period Weeks   Status On-going     PT LONG TERM GOAL #4   Title sit to stand </= 14 sec due to improved balance   Baseline 26 seconds   Time 8   Period Weeks   Status On-going     PT LONG TERM GOAL #5   Title get up from the floor after working on the car with >/= 50% greater ease due to increase strength and mobility   Time 8   Period Weeks   Status On-going     PT LONG TERM GOAL #6   Title FOTO score is </= 43% limitation   Baseline 53% limitation   Time 8   Period Weeks   Status On-going               Plan - 06/07/16 0949    Clinical Impression Statement Pt reports that he doesn't feel like his symptoms are improving.  Pt with dizziness and weakness since cardiac surgery.  Pt performed 5x sit to stand in 26 seconds and FOTO score is 53% limitation.  Pt is able to tolerate exercise in the clinic with close monitoring for safety  and symptoms of being dizzy.  Pt will continue to benefit  from skilled PT for strength, endurance, balance for safety at home and in the community.     Rehab Potential Good   Clinical Impairments Affecting Rehab Potential Heart bypass surgery 07/06/2015 and has been dizzy since then   PT Frequency 2x / week   PT Duration 8 weeks   PT Treatment/Interventions Cryotherapy;Electrical Stimulation;Gait training;Ultrasound;Moist Heat;Therapeutic activities;Therapeutic exercise;Balance training;Neuromuscular re-education;Patient/family education;Passive range of motion;Scar mobilization;Manual techniques;Energy conservation   PT Next Visit Plan posture and core strength, balance as tolerated, LE strength.    Consulted and Agree with Plan of Care Patient      Patient will benefit from skilled therapeutic intervention in order to improve the following deficits and impairments:  Abnormal gait, Decreased range of motion, Difficulty walking, Increased fascial restricitons, Decreased endurance, Increased muscle spasms, Dizziness, Decreased activity tolerance, Pain, Decreased balance, Impaired flexibility, Decreased strength, Decreased mobility  Visit Diagnosis: Muscle weakness (generalized)  Other abnormalities of gait and mobility  Pain in thoracic spine  Muscle spasm of back  Chronic right-sided low back pain without sciatica       G-Codes - 06/27/16 0949    Functional Assessment Tool Used FOTO: 53% limitation, 5x sit to stand 26 seconds   Functional Limitation Mobility: Walking and moving around   Mobility: Walking and Moving Around Current Status [phone]) At least 40 percent but less than 60 percent impaired, limited or restricted   Mobility: Walking and Moving Around Goal Status [phone]) At least 40 percent but less than 60 percent impaired, limited or restricted      Problem List Patient Active Problem List   Diagnosis Date Noted  . Idiopathic scoliosis 03/22/2016  . Diabetic neuropathy  ([place]) 03/22/2016  . Dysautonomia orthostatic hypotension syndrome ([place]) 02/25/2016  . CKD (chronic kidney disease), stage III 02/19/2016  . Neuropathic pain of foot 02/18/2016  . Coronary artery disease involving coronary bypass graft of native heart without angina pectoris   . Diabetes mellitus type 2, diet-controlled ([place])   . 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 Medication management 08/11/2015  . Orthostatic hypotension 07/24/2015  . 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 NSTEMI (non-ST elevated myocardial infarction) ([place]) 07/01/2015  . BMI 25.0-25.9,adult 02/02/2015  . PVD (peripheral vascular disease) ([place]) 01/01/2014  . Mixed hyperlipidemia 04/11/2013  . Prediabetes 04/11/2013  . Supine hypertension   . Vitamin D deficiency   . Vitamin B 12 deficiency   . Coronary atherosclerosis- s/p PCI to LAD in 2009 and PCI to RCA in 2011 02/27/2009  . Abdominal aortic aneurysm ([place]) 02/27/2009  . GERD 02/27/2009  . History of IBS 02/27/2009     Sigurd Sos, PT 2016-06-27 10:11 AM  [facility] [address] [place], [place], [address] Phone: [phone]   Fax:  [phone]  Name: [name] MRN: [id number] Date of Birth: 1934/02/18

## 2016-06-08 ENCOUNTER — Ambulatory Visit: Payer: Medicare Other | Admitting: Internal Medicine

## 2016-06-08 ENCOUNTER — Encounter: Payer: Self-pay | Admitting: Internal Medicine

## 2016-06-08 VITALS — BP 160/96 | HR 88 | Temp 97.5°F | Resp 16 | Ht 72.5 in | Wt 203.6 lb

## 2016-06-08 DIAGNOSIS — C44609 Unspecified malignant neoplasm of skin of left upper limb, including shoulder: Secondary | ICD-10-CM

## 2016-06-08 MED ORDER — DEXAMETHASONE SODIUM PHOSPHATE 10 MG/ML IJ SOLN
10.0000 mg | Freq: Once | INTRAMUSCULAR | Status: AC
  Administered 2016-06-07: 10 mg via INTRAMUSCULAR

## 2016-06-08 NOTE — Progress Notes (Signed)
Assessment and Plan:   1. Occipital neuralgia of right side -tried trigger point injection to the right occiput 1 cc lidocaine 1 cc 100mg /10 mL of decadron -if no relief with this and prednisone taper consider head ct -normal neuro exam  2. Malignant neoplasm of skin of left upper extremity -stitches removed without incident.       HPI 83 y.o.male presents for 1 week follow-up for suture removal of the left hand from recent skin cancer removal.  3 stitches per placed.   Patient also reports that he is having some headaches which have been present for about a month.  They are in the occiput of the head.  His occiput is tender to the touch.  He reports that he fell before christmas and struck this area.  It did improve when he was taking prednisone previously.  He reports that his blood pressure is doing fine and is much closer to a normal level when he is standing.  He is only taking 1 florinef now.  He has no N/V, vision changes, sudden onset headaches, or dizziness.   Past Medical History:  Diagnosis Date  . 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 Colon polyps   . Coronary atherosclerosis of unspecified type of vessel, native or graft   . Diabetes mellitus    Diet control   . Difficult intubation   . Esophageal reflux   . Gastritis, chronic   . Hypertension   . IBS (irritable bowel syndrome)   . Orthostatic hypotension    "BP has been dropping alot when I stand up for the last month or so" (02/17/2016)  . Other and unspecified hyperlipidemia   . Personal history of other diseases of digestive system   . Vitamin B 12 deficiency   . Vitamin D deficiency      Allergies  Allergen Reactions  . Cymbalta [Duloxetine Hcl]     Dizziness  . Keflex [Cephalexin] Other (See Comments)    Reaction: unknown   . Simvastatin Other (See Comments)    Reaction: unknown   . Sudafed [Pseudoephedrine]     Dizziness      Current Outpatient Prescriptions on File Prior to Visit  Medication Sig  Dispense Refill  . aspirin EC 81 MG EC tablet Take 1 tablet (81 mg total) by mouth daily.    Marland Kitchen CALCIUM PO Take 1 tablet by mouth daily.    . Cholecalciferol (VITAMIN D PO) Take 5,000 Units by mouth 2 (two) times daily.    . cyanocobalamin 1000 MCG tablet Take 1,000 mcg by mouth daily.    . diazepam (VALIUM) 5 MG tablet Take 1/2 to 1 tablet 2 - 3 x /day for neck pain / Headache (Patient not taking: Reported on 05/31/2016) 60 tablet 2  . diclofenac sodium (VOLTAREN) 1 % GEL Apply 1 application topically 3 (three) times daily. Ribs, back. 3 Tube 4  . Flaxseed, Linseed, 1000 MG CAPS Take 1,300 mg by mouth daily.     . fludrocortisone (FLORINEF) 0.1 MG tablet Take 2 tablets (200 mcg total) by mouth daily. 60 tablet 3  . fluorouracil (EFUDEX) 5 % cream     . fluticasone (FLONASE) 50 MCG/ACT nasal spray SHAKE LQ AND U 2 SPRAYS IEN D  0  . gabapentin (NEURONTIN) 100 MG capsule TAKE 1 CAPSULE(100 MG) BY MOUTH THREE TIMES DAILY 270 capsule 1  . lidocaine (XYLOCAINE) 5 % ointment Apply 1 application topically as needed. To ribs, back 35.44 g 0  . Magnesium  250 MG TABS Take 500 mg by mouth daily.     . metoCLOPramide (REGLAN) 10 MG tablet Take 1 tablet (10 mg total) by mouth every 6 (six) hours as needed for nausea (nausea/headache). (Patient not taking: Reported on 05/31/2016) 10 tablet 0  . multivitamin (THERAGRAN) per tablet Take 1 tablet by mouth daily.      Marland Kitchen omeprazole (PRILOSEC) 20 MG capsule TAKE 2 CAPSULES BY MOUTH EVERY DAY FOR INDIGESTION 180 capsule 0   No current facility-administered medications on file prior to visit.     ROS: all negative except above.   Physical Exam: There were no vitals filed for this visit. There were no vitals taken for this visit. General Appearance: Well developed well nourished, non-toxic appearing in no apparent distress. Eyes: PERRLA, EOMs, conjunctiva w/ no swelling or erythema or discharge Sinuses: No Frontal/maxillary tenderness.  Head atraumatic.  There is  tenderness to palpation of the occiput of the right side.   ENT/Mouth: Ear canals clear without swelling or erythema.  TM's normal bilaterally with no retractions, bulging, or loss of landmarks.   Neck: Supple, thyroid normal, no notable JVD  Respiratory: Respiratory effort normal, Clear breath sounds anteriorly and posteriorly bilaterally without rales, rhonchi, wheezing or stridor. No retractions or accessory muscle usage. Cardio: RRR with no MRGs.   Abdomen: Soft, + BS.  Non tender, no guarding, rebound, hernias, masses.  Musculoskeletal: Full ROM, 4/5 lower extremity strength, slow unsteady gait with cane.  Some shuffling of the steps.  Skin: Warm, dry without rashes  Neuro: Awake and oriented X 3, Cranial nerves intact. Normal muscle tone, no cerebellar symptoms. Sensation intact.  Psych: normal affect, Insight and Judgment appropriate.     Starlyn Skeans, PA-C 1:04 PM [facility]

## 2016-06-08 NOTE — Progress Notes (Signed)
     Patient returns today for recheck of a wound of the dorsal L hand  s/p excision of a SCC on 05/31/2016 and had sutures removed yesterday for felt adequate healing and healing ridge.  Today he noted sl separation of the wound edges of one end if the surgical site.   Exam find approx 2 cm wound of the dorsal R hand appearing clean and healthy.   The wound was scrubbed with H2O2 and painted with Betadine solution. Then the wound was sealed with "NewSkin", steri-stripped x 2 and then covered with a 2" x 3 " Tegaderm sterile pad.  Instrucked to try to maintain the wound covering for 7 more days.

## 2016-06-10 ENCOUNTER — Encounter: Payer: Self-pay | Admitting: Physical Therapy

## 2016-06-10 ENCOUNTER — Ambulatory Visit: Payer: Medicare Other | Admitting: Physical Therapy

## 2016-06-10 DIAGNOSIS — M62830 Muscle spasm of back: Secondary | ICD-10-CM | POA: Diagnosis not present

## 2016-06-10 DIAGNOSIS — M545 Low back pain, unspecified: Secondary | ICD-10-CM

## 2016-06-10 DIAGNOSIS — M6281 Muscle weakness (generalized): Secondary | ICD-10-CM | POA: Diagnosis not present

## 2016-06-10 DIAGNOSIS — G8929 Other chronic pain: Secondary | ICD-10-CM

## 2016-06-10 DIAGNOSIS — M546 Pain in thoracic spine: Secondary | ICD-10-CM | POA: Diagnosis not present

## 2016-06-10 DIAGNOSIS — R2689 Other abnormalities of gait and mobility: Secondary | ICD-10-CM | POA: Diagnosis not present

## 2016-06-10 NOTE — Therapy (Signed)
[facility] [address], [place], [place], [address] Phone: [phone]   Fax:  [phone]  Physical Therapy Treatment  Patient Details  Name: [name] MRN: [id number] Date of Birth: 1934/01/23 Referring Provider: Dr. Meredith Staggers  Encounter Date: 06/10/2016      PT End of Session - 06/10/16 0929    Visit Number 11   Number of Visits 20   Date for PT Re-Evaluation 06/30/16   Authorization Type G-code on 20th visit   PT Start Time 0927   PT Stop Time 1005   PT Time Calculation (min) 38 min   Activity Tolerance Patient tolerated treatment well   Behavior During Therapy [facility] for tasks assessed/performed      Past Medical History:  Diagnosis Date  . 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 Colon polyps   . Coronary atherosclerosis of unspecified type of vessel, native or graft   . Difficult intubation   . Esophageal reflux   . Hypertension   . IBS (irritable bowel syndrome)   . Orthostatic hypotension    "BP has been dropping alot when I stand up for the last month or so" (02/17/2016)  . Vitamin B 12 deficiency   . Vitamin D deficiency     Past Surgical History:  Procedure Laterality Date  . BLADDER SURGERY  1969   traumatic pelvic fractures, urethral and bladder repair  . CARDIAC CATHETERIZATION N/A 07/01/2015   Procedure: Left Heart Cath and Coronary Angiography;  Surgeon: Wellington Hampshire, MD;  Location: [place] [facility];  Service: Cardiovascular;  Laterality: N/A;  . CORONARY ANGIOPLASTY WITH STENT PLACEMENT    . CORONARY ARTERY BYPASS GRAFT N/A 07/06/2015   Procedure: CORONARY ARTERY BYPASS GRAFTING (CABG)x 4   utilizing the left internal mammary artery and endoscopically harvested bilateral  sapheneous vein.;  Surgeon: Ivin Poot, MD;  Location: [place];  Service: Open Heart Surgery;  Laterality: N/A;  . KNEE SURGERY    . TEE WITHOUT CARDIOVERSION N/A 07/06/2015   Procedure: TRANSESOPHAGEAL  ECHOCARDIOGRAM (TEE);  Surgeon: Ivin Poot, MD;  Location: [place];  Service: Open Heart Surgery;  Laterality: N/A;    There were no vitals filed for this visit.      Subjective Assessment - 06/10/16 0927    Subjective Today is ok, dizziness not so bad today.    Pertinent History dizzy; sometimes legs give way   Limitations Standing   Patient Stated Goals reduce back pain, increase strength for prior function, walking 2 miles per day,    Currently in Pain? Yes   Pain Score 2    Pain Location Back   Pain Orientation Lower   Pain Descriptors / Indicators Aching   Pain Type Chronic pain   Pain Radiating Towards none   Pain Onset More than a month ago   Pain Frequency Intermittent   Aggravating Factors  walking, standing, early morning                         [facility] Adult PT Treatment/Exercise - 06/10/16 0001      Lumbar Exercises: Aerobic   Stationary Bike Nustep L4 x 7 minutes  Seat 12 arms 11; therapist present to discuss treatment     Lumbar Exercises: Machines for Strengthening   Leg Press #80 3x10     Lumbar Exercises: Standing   Row Strengthening;Both;20 reps;Theraband  20# verbal cues for posture and form  Shoulder Extension Strengthening;Both;20 reps;Theraband  20#  verbal cues for posture and form    Other Standing Lumbar Exercises Lat pull #25  3x10     Lumbar Exercises: Supine   Ab Set 20 reps  Red ball knees to chest   Bent Knee Raise 10 reps   Bridge 20 reps     Knee/Hip Exercises: Standing   Hip Abduction Stengthening;Both;2 sets;10 reps   Abduction Limitations 3# added   Hip Extension Stengthening;Both;2 sets;10 reps   Extension Limitations 3# added   Forward Step Up Both;2 sets;Hand Hold: 1     Knee/Hip Exercises: Seated   Knee/Hip Flexion marching: 2x10 bil.   Other Seated Knee/Hip Exercises 3# added                  PT Short Term Goals - 06/07/16 [id number]      PT SHORT TERM GOAL #4   Title back pain with walking  and standing decreased >/= 25% due to standing up straighter and increased trunk strength   Time 4   Period Weeks   Status On-going           PT Long Term Goals - 06/07/16 [id number]      PT LONG TERM GOAL #1   Title independent with HEP   Time 8   Period Weeks   Status On-going     PT LONG TERM GOAL #2   Title walk 1 mile at the park with 2-3 rests with SPC due to back pain decreased >/= 50%   Time 8   Period Weeks   Status On-going     PT LONG TERM GOAL #3   Title trunk extensor strength has improved so patient is able to walk in the community with upright posture and no verbal cues from wife   Time 8   Period Weeks   Status On-going     PT LONG TERM GOAL #4   Title sit to stand </= 14 sec due to improved balance   Baseline 26 seconds   Time 8   Period Weeks   Status On-going     PT LONG TERM GOAL #5   Title get up from the floor after working on the car with >/= 50% greater ease due to increase strength and mobility   Time 8   Period Weeks   Status On-going     PT LONG TERM GOAL #6   Title FOTO score is </= 43% limitation   Baseline 53% limitation   Time 8   Period Weeks   Status On-going               Plan - 06/10/16 [id number]    Clinical Impression Statement Pt continues to have decreased core strength and forward head posture. Pt having less dizziness today and able to tolerate more standing exercsies. Pt did well with all strengthening. Having some LE fatigue after leg press. Pt will continue to benefit from skilled thearpy for LE strenghtneing and core stability.    Rehab Potential Good   Clinical Impairments Affecting Rehab Potential Heart bypass surgery 07/06/2015 and has been dizzy since then   PT Frequency 2x / week   PT Duration 8 weeks   PT Treatment/Interventions Cryotherapy;Electrical Stimulation;Gait training;Ultrasound;Moist Heat;Therapeutic activities;Therapeutic exercise;Balance training;Neuromuscular re-education;Patient/family  education;Passive range of motion;Scar mobilization;Manual techniques;Energy conservation      Patient will benefit from skilled therapeutic intervention in order to improve the following deficits and impairments:  Abnormal gait, Decreased range of motion, Difficulty  walking, Increased fascial restricitons, Decreased endurance, Increased muscle spasms, Dizziness, Decreased activity tolerance, Pain, Decreased balance, Impaired flexibility, Decreased strength, Decreased mobility  Visit Diagnosis: Muscle weakness (generalized)  Other abnormalities of gait and mobility  Pain in thoracic spine  Muscle spasm of back  Chronic right-sided low back pain without sciatica     Problem List Patient Active Problem List   Diagnosis Date Noted  . Idiopathic scoliosis 03/22/2016  . Diabetic neuropathy ([place]) 03/22/2016  . Dysautonomia orthostatic hypotension syndrome ([place]) 02/25/2016  . CKD (chronic kidney disease), stage III 02/19/2016  . Neuropathic pain of foot 02/18/2016  . Coronary artery disease involving coronary bypass graft of native heart without angina pectoris   . Diabetes mellitus type 2, diet-controlled ([place])   . 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 Medication management 08/11/2015  . Orthostatic hypotension 07/24/2015  . 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 NSTEMI (non-ST elevated myocardial infarction) ([place]) 07/01/2015  . BMI 25.0-25.9,adult 02/02/2015  . PVD (peripheral vascular disease) ([place]) 01/01/2014  . Mixed hyperlipidemia 04/11/2013  . Prediabetes 04/11/2013  . Supine hypertension   . Vitamin D deficiency   . Vitamin B 12 deficiency   . Coronary atherosclerosis- s/p PCI to LAD in 2009 and PCI to RCA in 2011 02/27/2009  . Abdominal aortic aneurysm ([place]) 02/27/2009  . GERD 02/27/2009  . History of IBS 02/27/2009    Mikle Bosworth PTA 06/10/2016, 10:03 AM  [facility] [address] [place], [place],  [address] Phone: [phone]   Fax:  [phone]  Name: [name] MRN: [id number] Date of Birth: [birth year]

## 2016-06-14 ENCOUNTER — Ambulatory Visit: Payer: Medicare Other

## 2016-06-14 DIAGNOSIS — M546 Pain in thoracic spine: Secondary | ICD-10-CM

## 2016-06-14 DIAGNOSIS — M545 Low back pain, unspecified: Secondary | ICD-10-CM

## 2016-06-14 DIAGNOSIS — G8929 Other chronic pain: Secondary | ICD-10-CM

## 2016-06-14 DIAGNOSIS — M6281 Muscle weakness (generalized): Secondary | ICD-10-CM | POA: Diagnosis not present

## 2016-06-14 DIAGNOSIS — M62830 Muscle spasm of back: Secondary | ICD-10-CM

## 2016-06-14 DIAGNOSIS — R2689 Other abnormalities of gait and mobility: Secondary | ICD-10-CM

## 2016-06-14 NOTE — Therapy (Signed)
[facility] [address], [place], [place], [address] Phone: [phone]   Fax:  [phone]  Physical Therapy Treatment  Patient Details  Name: [name] MRN: [id number] Date of Birth: 07-17-1933 Referring Provider: Dr. Meredith Staggers  Encounter Date: 06/14/2016      PT End of Session - 06/14/16 1007    Visit Number 12   Number of Visits 20   Date for PT Re-Evaluation 06/30/16   Authorization Type G-code on 20th visit   PT Start Time 0930   PT Stop Time 1010   PT Time Calculation (min) 40 min   Activity Tolerance Patient tolerated treatment well   Behavior During Therapy [facility] for tasks assessed/performed      Past Medical History:  Diagnosis Date  . 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 Colon polyps   . Coronary atherosclerosis of unspecified type of vessel, native or graft   . Difficult intubation   . Esophageal reflux   . Hypertension   . IBS (irritable bowel syndrome)   . Orthostatic hypotension    "BP has been dropping alot when I stand up for the last month or so" (02/17/2016)  . Vitamin B 12 deficiency   . Vitamin D deficiency     Past Surgical History:  Procedure Laterality Date  . BLADDER SURGERY  1969   traumatic pelvic fractures, urethral and bladder repair  . CARDIAC CATHETERIZATION N/A 07/01/2015   Procedure: Left Heart Cath and Coronary Angiography;  Surgeon: Wellington Hampshire, MD;  Location: [place] [facility];  Service: Cardiovascular;  Laterality: N/A;  . CORONARY ANGIOPLASTY WITH STENT PLACEMENT    . CORONARY ARTERY BYPASS GRAFT N/A 07/06/2015   Procedure: CORONARY ARTERY BYPASS GRAFTING (CABG)x 4   utilizing the left internal mammary artery and endoscopically harvested bilateral  sapheneous vein.;  Surgeon: Ivin Poot, MD;  Location: [place];  Service: Open Heart Surgery;  Laterality: N/A;  . KNEE SURGERY    . TEE WITHOUT CARDIOVERSION N/A 07/06/2015   Procedure: TRANSESOPHAGEAL  ECHOCARDIOGRAM (TEE);  Surgeon: Ivin Poot, MD;  Location: [place];  Service: Open Heart Surgery;  Laterality: N/A;    There were no vitals filed for this visit.      Subjective Assessment - 06/14/16 0924    Subjective I'm sore from climbing under the house to fix a water leak.  Still dizzy-no change with this.     Currently in Pain? Yes   Pain Score 4    Pain Location Back   Pain Orientation Lower   Pain Descriptors / Indicators Aching   Pain Type Chronic pain   Pain Onset More than a month ago   Pain Frequency Intermittent   Aggravating Factors  wallking, standing, early morning   Pain Relieving Factors sitting, moving around in the morning                         [facility] Adult PT Treatment/Exercise - 06/14/16 0001      Lumbar Exercises: Aerobic   Stationary Bike Nustep L2 x 10 minutes  Seat 12 arms 11; therapist present to discuss treatment     Lumbar Exercises: Machines for Strengthening   Leg Press 80# 3x10 bil. legs  seat 9     Lumbar Exercises: Standing   Row --   Shoulder Extension --     Lumbar Exercises: Seated   Long Arc Quad on Chair Strengthening;2 sets;10 reps  3#     Lumbar Exercises: Supine   Bent Knee Raise 10 reps   Bridge 20 reps   Other Supine Lumbar Exercises Ball squeeze  3 second holds     Knee/Hip Exercises: Standing   Hip Abduction Stengthening;Both;2 sets;10 reps   Abduction Limitations 3# added   Hip Extension Stengthening;Both;2 sets;10 reps   Extension Limitations 3# added   Forward Step Up Both;2 sets;Hand Hold: 1     Knee/Hip Exercises: Seated   Knee/Hip Flexion marching: 2x10 bil.   Other Seated Knee/Hip Exercises 3# added     Knee/Hip Exercises: Supine   Bridges with Ball Squeeze Strengthening;2 sets;10 reps  ball squeeze only     Shoulder Exercises: Supine   Horizontal ABduction Strengthening;Both;15 reps;Theraband   Theraband Level (Shoulder Horizontal ABduction) Level 3 (Green)   External Rotation  Strengthening;Both;15 reps;Theraband   Theraband Level (Shoulder External Rotation) Level 3 (Green)                  PT Short Term Goals - 06/07/16 [id number]      PT SHORT TERM GOAL #4   Title back pain with walking and standing decreased >/= 25% due to standing up straighter and increased trunk strength   Time 4   Period Weeks   Status On-going           PT Long Term Goals - 06/14/16 0949      PT LONG TERM GOAL #1   Title independent with HEP   Time 8   Period Weeks   Status On-going     PT LONG TERM GOAL #2   Title walk 1 mile at the park with 2-3 rests with SPC due to back pain decreased >/= 50%   Time 8   Period Weeks   Status On-going     PT LONG TERM GOAL #3   Title trunk extensor strength has improved so patient is able to walk in the community with upright posture and no verbal cues from wife   Time 8   Period Weeks   Status On-going     PT LONG TERM GOAL #5   Title get up from the floor after working on the car with >/= 50% greater ease due to increase strength and mobility   Time 8   Period Weeks   Status On-going     PT LONG TERM GOAL #6   Title FOTO score is </= 43% limitation   Baseline 53% limitation   Time 8   Period Weeks   Status On-going               Plan - 06/14/16 0950    Clinical Impression Statement Pt reports 10% improvement in lumbar pain since the start of care.  Pt able to tolerate all exercises in the clinic without increased pain or significant difficulty.  Pt with improved postural awareness and requires fewer verbal cues for postural correction with exercise in the clinic today.  Pt with continued dizziness of unknown cause.  Pt will continue to benefit from skilled PT for strength, endurance, balance and flexibility.     Rehab Potential Good   Clinical Impairments Affecting Rehab Potential Heart bypass surgery 07/06/2015 and has been dizzy since then   PT Frequency 2x / week   PT Duration 8 weeks   PT  Treatment/Interventions Cryotherapy;Electrical Stimulation;Gait training;Ultrasound;Moist Heat;Therapeutic activities;Therapeutic exercise;Balance training;Neuromuscular re-education;Patient/family education;Passive range of motion;Scar mobilization;Manual techniques;Energy conservation   PT Next Visit Plan posture and core strength, balance  as tolerated, LE strength.    Consulted and Agree with Plan of Care Patient      Patient will benefit from skilled therapeutic intervention in order to improve the following deficits and impairments:  Abnormal gait, Decreased range of motion, Difficulty walking, Increased fascial restricitons, Decreased endurance, Increased muscle spasms, Dizziness, Decreased activity tolerance, Pain, Decreased balance, Impaired flexibility, Decreased strength, Decreased mobility  Visit Diagnosis: Muscle weakness (generalized)  Other abnormalities of gait and mobility  Pain in thoracic spine  Muscle spasm of back  Chronic right-sided low back pain without sciatica     Problem List Patient Active Problem List   Diagnosis Date Noted  . Idiopathic scoliosis 03/22/2016  . Diabetic neuropathy ([place]) 03/22/2016  . Dysautonomia orthostatic hypotension syndrome ([place]) 02/25/2016  . CKD (chronic kidney disease), stage III 02/19/2016  . Neuropathic pain of foot 02/18/2016  . Coronary artery disease involving coronary bypass graft of native heart without angina pectoris   . Diabetes mellitus type 2, diet-controlled ([place])   . 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 Medication management 08/11/2015  . Orthostatic hypotension 07/24/2015  . 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 NSTEMI (non-ST elevated myocardial infarction) ([place]) 07/01/2015  . BMI 25.0-25.9,adult 02/02/2015  . PVD (peripheral vascular disease) ([place]) 01/01/2014  . Mixed hyperlipidemia 04/11/2013  . Prediabetes 04/11/2013  . Supine hypertension   . Vitamin D deficiency   . Vitamin B 12 deficiency   . Coronary  atherosclerosis- s/p PCI to LAD in 2009 and PCI to RCA in 2011 02/27/2009  . Abdominal aortic aneurysm ([place]) 02/27/2009  . GERD 02/27/2009  . History of IBS 02/27/2009     [name], PT 06/14/16 10:10 AM  [name] [address] [place], [place], [address] Phone: [phone]   Fax:  [phone]  Name: [name] MRN: [id number] Date of Birth: April 24, 1934

## 2016-06-16 ENCOUNTER — Ambulatory Visit (HOSPITAL_COMMUNITY)
Admission: RE | Admit: 2016-06-16 | Discharge: 2016-06-16 | Disposition: A | Discharge status: Home / Self Care | Payer: Medicare Other | Source: Ambulatory Visit | Attending: Cardiovascular Disease | Admitting: Cardiovascular Disease

## 2016-06-16 DIAGNOSIS — I700 Atherosclerosis of aorta: Secondary | ICD-10-CM | POA: Diagnosis not present

## 2016-06-16 DIAGNOSIS — I714 Abdominal aortic aneurysm, without rupture, unspecified: Secondary | ICD-10-CM

## 2016-06-16 DIAGNOSIS — Z951 Presence of aortocoronary bypass graft: Secondary | ICD-10-CM | POA: Diagnosis not present

## 2016-06-16 DIAGNOSIS — I723 Aneurysm of iliac artery: Secondary | ICD-10-CM | POA: Diagnosis not present

## 2016-06-16 DIAGNOSIS — N189 Chronic kidney disease, unspecified: Secondary | ICD-10-CM | POA: Diagnosis not present

## 2016-06-16 DIAGNOSIS — E1122 Type 2 diabetes mellitus with diabetic chronic kidney disease: Secondary | ICD-10-CM | POA: Insufficient documentation

## 2016-06-16 DIAGNOSIS — Z87891 Personal history of nicotine dependence: Secondary | ICD-10-CM | POA: Diagnosis not present

## 2016-06-16 DIAGNOSIS — I129 Hypertensive chronic kidney disease with stage 1 through stage 4 chronic kidney disease, or unspecified chronic kidney disease: Secondary | ICD-10-CM | POA: Diagnosis not present

## 2016-06-16 DIAGNOSIS — E1151 Type 2 diabetes mellitus with diabetic peripheral angiopathy without gangrene: Secondary | ICD-10-CM | POA: Diagnosis not present

## 2016-06-16 DIAGNOSIS — I2510 Atherosclerotic heart disease of native coronary artery without angina pectoris: Secondary | ICD-10-CM | POA: Insufficient documentation

## 2016-06-16 DIAGNOSIS — J449 Chronic obstructive pulmonary disease, unspecified: Secondary | ICD-10-CM | POA: Insufficient documentation

## 2016-06-16 DIAGNOSIS — E785 Hyperlipidemia, unspecified: Secondary | ICD-10-CM | POA: Insufficient documentation

## 2016-06-17 ENCOUNTER — Ambulatory Visit: Payer: Medicare Other | Admitting: Physical Therapy

## 2016-06-17 ENCOUNTER — Encounter: Payer: Self-pay | Admitting: Physical Therapy

## 2016-06-17 DIAGNOSIS — M62830 Muscle spasm of back: Secondary | ICD-10-CM | POA: Diagnosis not present

## 2016-06-17 DIAGNOSIS — G8929 Other chronic pain: Secondary | ICD-10-CM

## 2016-06-17 DIAGNOSIS — M546 Pain in thoracic spine: Secondary | ICD-10-CM

## 2016-06-17 DIAGNOSIS — R2689 Other abnormalities of gait and mobility: Secondary | ICD-10-CM

## 2016-06-17 DIAGNOSIS — M6281 Muscle weakness (generalized): Secondary | ICD-10-CM | POA: Diagnosis not present

## 2016-06-17 DIAGNOSIS — M545 Low back pain, unspecified: Secondary | ICD-10-CM

## 2016-06-17 NOTE — Therapy (Addendum)
[facility] [address], [place], [place], [address] Phone: [phone]   Fax:  [phone]  Physical Therapy Treatment  Patient Details  Name: [name] MRN: [id number] Date of Birth: 02/06/1934 Referring Provider: Dr. Meredith Staggers  Encounter Date: 06/17/2016      PT End of Session - 06/17/16 0933    Visit Number 13   Number of Visits 20   Date for PT Re-Evaluation 06/30/16   Authorization Type G-code on 20th visit   PT Start Time 0930   PT Stop Time 1008   PT Time Calculation (min) 38 min   Activity Tolerance Patient tolerated treatment well   Behavior During Therapy [facility] for tasks assessed/performed      Past Medical History:  Diagnosis Date  . 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 Colon polyps   . Coronary atherosclerosis of unspecified type of vessel, native or graft   . Difficult intubation   . Esophageal reflux   . Hypertension   . IBS (irritable bowel syndrome)   . Orthostatic hypotension    "BP has been dropping alot when I stand up for the last month or so" (02/17/2016)  . Vitamin B 12 deficiency   . Vitamin D deficiency     Past Surgical History:  Procedure Laterality Date  . BLADDER SURGERY  1969   traumatic pelvic fractures, urethral and bladder repair  . CARDIAC CATHETERIZATION N/A 07/01/2015   Procedure: Left Heart Cath and Coronary Angiography;  Surgeon: Wellington Hampshire, MD;  Location: [place] [facility];  Service: Cardiovascular;  Laterality: N/A;  . CORONARY ANGIOPLASTY WITH STENT PLACEMENT    . CORONARY ARTERY BYPASS GRAFT N/A 07/06/2015   Procedure: CORONARY ARTERY BYPASS GRAFTING (CABG)x 4   utilizing the left internal mammary artery and endoscopically harvested bilateral  sapheneous vein.;  Surgeon: Ivin Poot, MD;  Location: [place];  Service: Open Heart Surgery;  Laterality: N/A;  . KNEE SURGERY    . TEE WITHOUT CARDIOVERSION N/A 07/06/2015   Procedure: TRANSESOPHAGEAL  ECHOCARDIOGRAM (TEE);  Surgeon: Ivin Poot, MD;  Location: [place];  Service: Open Heart Surgery;  Laterality: N/A;    There were no vitals filed for this visit.      Subjective Assessment - 06/17/16 0932    Subjective Pt reports feeling the same.    Pertinent History dizzy; sometimes legs give way   Limitations Standing   Patient Stated Goals reduce back pain, increase strength for prior function, walking 2 miles per day,    Currently in Pain? Yes   Pain Score 3    Pain Location Back   Pain Orientation Lower   Pain Descriptors / Indicators Aching   Pain Type Chronic pain   Pain Radiating Towards none   Pain Onset More than a month ago   Pain Frequency Intermittent                         [facility] Adult PT Treatment/Exercise - 06/17/16 0001      Lumbar Exercises: Aerobic   Stationary Bike Nustep L4 x 10 minutes  Seat 12 arms 11; therapist present to discuss treatment     Lumbar Exercises: Machines for Strengthening   Leg Press 80# 3x10 bil. legs  seat 9     Lumbar Exercises: Supine   Ab Set 20 reps  Red ball knees to chest   Bent Knee Raise 20 reps   Bridge  20 reps     Knee/Hip Exercises: Standing   Knee Flexion Strengthening;Both;2 sets;10 reps  Hmastring curls   Hip Extension Stengthening;Both;2 sets;10 reps  With arm reaching   Forward Step Up Both;2 sets;Hand Hold: 1     Shoulder Exercises: Supine   Horizontal ABduction Strengthening;Both;15 reps;Theraband   Theraband Level (Shoulder Horizontal ABduction) Level 3 (Green)   External Rotation Strengthening;Both;15 reps;Theraband   Theraband Level (Shoulder External Rotation) Level 3 (Green)   Other Supine Exercises Extension 20  green band; hooklying                PT Education - 06/17/16 0949    Education provided Yes   Education Details LE standing strength   Person(s) Educated Patient   Methods Explanation;Demonstration;Handout   Comprehension Verbalized understanding           PT Short Term Goals - 06/07/16 0938      PT SHORT TERM GOAL #4   Title back pain with walking and standing decreased >/= 25% due to standing up straighter and increased trunk strength   Time 4   Period Weeks   Status On-going           PT Long Term Goals - 06/17/16 1006      PT LONG TERM GOAL #4   Title sit to stand </= 14 sec due to improved balance   Baseline 20   Time 8   Period Weeks   Status On-going               Plan - 06/17/16 1022    Clinical Impression Statement Pt continues to have dizzyness and back pain. Pt has gained strength and endurance with physical therapy. Pt reports once the weather is better he willbe able to get out and walk more. Pt has improved sit to stand to 20 seconds. Pt wanting to see MD before continuing therapy.    Rehab Potential Good   Clinical Impairments Affecting Rehab Potential Heart bypass surgery 07/06/2015 and has been dizzy since then   PT Frequency 2x / week   PT Duration 8 weeks   PT Treatment/Interventions Cryotherapy;Electrical Stimulation;Gait training;Ultrasound;Moist Heat;Therapeutic activities;Therapeutic exercise;Balance training;Neuromuscular re-education;Patient/family education;Passive range of motion;Scar mobilization;Manual techniques;Energy conservation   PT Next Visit Plan posture and core strength, balance as tolerated, LE strength.    Family Member Consulted Patient      Patient will benefit from skilled therapeutic intervention in order to improve the following deficits and impairments:  Abnormal gait, Decreased range of motion, Difficulty walking, Increased fascial restricitons, Decreased endurance, Increased muscle spasms, Dizziness, Decreased activity tolerance, Pain, Decreased balance, Impaired flexibility, Decreased strength, Decreased mobility  Visit Diagnosis: Muscle weakness (generalized)  Other abnormalities of gait and mobility  Pain in thoracic spine  Muscle spasm of back  Chronic  right-sided low back pain without sciatica     Problem List Patient Active Problem List   Diagnosis Date Noted  . Idiopathic scoliosis 03/22/2016  . Diabetic neuropathy ([employer]) 03/22/2016  . Dysautonomia orthostatic hypotension syndrome ([place]) 02/25/2016  . CKD (chronic kidney disease), stage III 02/19/2016  . Neuropathic pain of foot 02/18/2016  . Coronary artery disease involving coronary bypass graft of native heart without angina pectoris   . Diabetes mellitus type 2, diet-controlled ([place])   . 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 Medication management 08/11/2015  . Orthostatic hypotension 07/24/2015  . 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 NSTEMI (non-ST elevated myocardial infarction) ([place]) 07/01/2015  . BMI 25.0-25.9,adult 02/02/2015  . PVD (  peripheral vascular disease) ([place]) 01/01/2014  . Mixed hyperlipidemia 04/11/2013  . Prediabetes 04/11/2013  . Supine hypertension   . Vitamin D deficiency   . Vitamin B 12 deficiency   . Coronary atherosclerosis- s/p PCI to LAD in 2009 and PCI to RCA in 2011 02/27/2009  . Abdominal aortic aneurysm ([place]) 02/27/2009  . GERD 02/27/2009  . History of IBS 02/27/2009    Mikle Bosworth PTA 06/17/2016, 10:25 AM PHYSICAL THERAPY DISCHARGE SUMMARY G-codes:  Mobility Category: Goal status: CK D/C: CK   Visits from Start of Care: 13  Current functional level related to goals / functional outcomes: Pt didn't return to PT after visit on 06/14/16.  See above for most current PT status.     Remaining deficits: See above   Education / Equipment: HEP Plan: Patient agrees to discharge.  Patient goals were partially met. Patient is being discharged due to not returning since the last visit.  ?????        Sigurd Sos, PT 08/17/16 9:24 AM   [facility] [address] [place], [place], [address] Phone: [phone]   Fax:  [phone]  Name: [name] MRN: [id number] Date  of Birth: 08-06-1933

## 2016-06-17 NOTE — Patient Instructions (Signed)
ABDUCTION: Standing - Resistance Band (Active)   Stand, feet flat. Against yellow resistance band, lift right leg out to side. Complete _2__ sets of _10__ repetitions. Perform __1_ sessions per day.   Strengthening: Hip Flexion - Resisted   With tubing around left ankle, anchor behind, bring leg forward, keeping knee straight. Repeat __10__ times per set. Do __2__ sets per session. Do __1__ sessions per day.  Strengthening: Hip Extension - Resisted   With tubing around right ankle, face anchor and pull leg straight back. Repeat __10__ times per set. Do __2__ sets per session. Do __1__ sessions per day.  Mikle Bosworth, PTA 06/17/16 9:46 AM  [facility] [address], [place], [place] [address] Phone # [phone] Fax [phone]

## 2016-06-27 DIAGNOSIS — M5134 Other intervertebral disc degeneration, thoracic region: Secondary | ICD-10-CM | POA: Diagnosis not present

## 2016-06-27 DIAGNOSIS — M9902 Segmental and somatic dysfunction of thoracic region: Secondary | ICD-10-CM | POA: Diagnosis not present

## 2016-06-29 DIAGNOSIS — M9902 Segmental and somatic dysfunction of thoracic region: Secondary | ICD-10-CM | POA: Diagnosis not present

## 2016-06-29 DIAGNOSIS — M5134 Other intervertebral disc degeneration, thoracic region: Secondary | ICD-10-CM | POA: Diagnosis not present

## 2016-07-04 ENCOUNTER — Encounter: Payer: Self-pay | Admitting: Physical Medicine & Rehabilitation

## 2016-07-04 ENCOUNTER — Encounter: Payer: Medicare Other | Attending: Physical Medicine & Rehabilitation | Admitting: Physical Medicine & Rehabilitation

## 2016-07-04 VITALS — BP 155/91 | HR 77

## 2016-07-04 DIAGNOSIS — M4106 Infantile idiopathic scoliosis, lumbar region: Secondary | ICD-10-CM

## 2016-07-04 DIAGNOSIS — E785 Hyperlipidemia, unspecified: Secondary | ICD-10-CM | POA: Diagnosis not present

## 2016-07-04 DIAGNOSIS — Z8249 Family history of ischemic heart disease and other diseases of the circulatory system: Secondary | ICD-10-CM | POA: Insufficient documentation

## 2016-07-04 DIAGNOSIS — K589 Irritable bowel syndrome without diarrhea: Secondary | ICD-10-CM | POA: Insufficient documentation

## 2016-07-04 DIAGNOSIS — Z951 Presence of aortocoronary bypass graft: Secondary | ICD-10-CM | POA: Diagnosis not present

## 2016-07-04 DIAGNOSIS — M545 Low back pain: Secondary | ICD-10-CM | POA: Insufficient documentation

## 2016-07-04 DIAGNOSIS — R0789 Other chest pain: Secondary | ICD-10-CM | POA: Diagnosis not present

## 2016-07-04 DIAGNOSIS — M4185 Other forms of scoliosis, thoracolumbar region: Secondary | ICD-10-CM | POA: Diagnosis not present

## 2016-07-04 DIAGNOSIS — E559 Vitamin D deficiency, unspecified: Secondary | ICD-10-CM | POA: Diagnosis not present

## 2016-07-04 DIAGNOSIS — K219 Gastro-esophageal reflux disease without esophagitis: Secondary | ICD-10-CM | POA: Insufficient documentation

## 2016-07-04 DIAGNOSIS — M9902 Segmental and somatic dysfunction of thoracic region: Secondary | ICD-10-CM | POA: Diagnosis not present

## 2016-07-04 DIAGNOSIS — Z87891 Personal history of nicotine dependence: Secondary | ICD-10-CM | POA: Insufficient documentation

## 2016-07-04 DIAGNOSIS — I2510 Atherosclerotic heart disease of native coronary artery without angina pectoris: Secondary | ICD-10-CM | POA: Diagnosis not present

## 2016-07-04 DIAGNOSIS — Z79899 Other long term (current) drug therapy: Secondary | ICD-10-CM | POA: Diagnosis not present

## 2016-07-04 DIAGNOSIS — Z955 Presence of coronary angioplasty implant and graft: Secondary | ICD-10-CM | POA: Diagnosis not present

## 2016-07-04 DIAGNOSIS — I10 Essential (primary) hypertension: Secondary | ICD-10-CM | POA: Insufficient documentation

## 2016-07-04 DIAGNOSIS — E1142 Type 2 diabetes mellitus with diabetic polyneuropathy: Secondary | ICD-10-CM | POA: Insufficient documentation

## 2016-07-04 DIAGNOSIS — G588 Other specified mononeuropathies: Secondary | ICD-10-CM | POA: Insufficient documentation

## 2016-07-04 DIAGNOSIS — M5134 Other intervertebral disc degeneration, thoracic region: Secondary | ICD-10-CM | POA: Diagnosis not present

## 2016-07-04 DIAGNOSIS — G8929 Other chronic pain: Secondary | ICD-10-CM | POA: Insufficient documentation

## 2016-07-04 MED ORDER — METHOCARBAMOL 500 MG PO TABS: 500.0000 mg | ORAL_TABLET | Freq: Four times a day (QID) | ORAL | 2 refills | Status: DC | PRN

## 2016-07-04 NOTE — Progress Notes (Signed)
Subjective:    Patient ID: [name], male    DOB: February 11, 1934, 83 y.o.   MRN: [id number]  HPI   [name] is here in follow up of his chronic pain. His right ribs are still doing quite well. The same cannot be said about his lower back. We started physical therapy in January and he feels that if anything the therapy has worsened his pain. Therapy reports improved mobilty and strenghtening. I asked the patient what was done, and he reports using the bike and squat machine. He doesn't recall any specific stretches or modalities being utilized.  I asked the patient about the low back pain. It is most severe when he's standing or walking. He tries to work around the house somewhat which triggers the pain also. He doesn't seem to mind bending as much. Sitting can be uncomfortable too. The pain is predominantly in the right low back and central low back above the waist line.   I reivewed ad CT of the lumbar spine from 05/2013. In addition to his scoliosis it revealed:    L2-3: There is facet arthropathy. Minimal disc bulge is identified but the central canal and foramina appear open.  L3-4: There is some disc bulging and endplate spurring eccentric to the left. Narrowing is seen in the left lateral recess and foramen. The central canal appears mildly narrowed overall. Right foramen is open.  L4-5: There is facet arthropathy, shallow disc bulge and ligamentum flavum thickening causing moderate central canal narrowing. Left worse than right foraminal narrowing is identified.  L5-S1: Small disc osteophyte complex is identified and there is facet arthropathy. There is some narrowing in the right lateral recess due to disc and endplate spur. The thecal sac appears widely patent. Right worse than left foraminal narrowing is identified.   Pain Inventory Average Pain 6 Pain Right Now 6 My pain is constant and aching  In the last 24 hours, has pain interfered with the  following? General activity 3 Relation with others 3 Enjoyment of life 3 What TIME of day is your pain at its worst? daytime Sleep (in general) Fair  Pain is worse with: walking, bending and standing Pain improves with: nothing Relief from Meds: 1  Mobility walk without assistance use a cane how many minutes can you walk? 10 ability to climb steps?  yes do you drive?  yes  Function retired  Neuro/Psych numbness  Prior Studies Any changes since last visit?  no  Physicians involved in your care Any changes since last visit?  no   Family History  Problem Relation Age of Onset  . Heart attack Father     died age 77  . Heart attack Brother     died age 50  . Heart attack Sister     died age 38  . Colon cancer Sister   . Liver cancer Sister   . Diabetes Maternal Grandmother   . Colon polyps Sister     and brothers x 2    Social History   Social History  . Marital status: Married    Spouse name: N/A  . Number of children: 4   . Years of education: N/A   Occupational History  . Retired    Social History Main Topics  . Smoking status: Former Smoker    Quit date: 07/16/1963  . Smokeless tobacco: Never Used  . Alcohol use No  . Drug use: No  . Sexual activity: Not Currently   Other Topics Concern  .  None   Social History Narrative   1 caffeine drink daily    Past Surgical History:  Procedure Laterality Date  . BLADDER SURGERY  1969   traumatic pelvic fractures, urethral and bladder repair  . CARDIAC CATHETERIZATION N/A 07/01/2015   Procedure: Left Heart Cath and Coronary Angiography;  Surgeon: Wellington Hampshire, MD;  Location: [facility];  Service: Cardiovascular;  Laterality: N/A;  . CORONARY ANGIOPLASTY WITH STENT PLACEMENT    . CORONARY ARTERY BYPASS GRAFT N/A 07/06/2015   Procedure: CORONARY ARTERY BYPASS GRAFTING (CABG)x 4   utilizing the left internal mammary artery and endoscopically harvested bilateral  sapheneous vein.;  Surgeon: Ivin Poot, MD;  Location: [facility];  Service: Open Heart Surgery;  Laterality: N/A;  . KNEE SURGERY    . TEE WITHOUT CARDIOVERSION N/A 07/06/2015   Procedure: TRANSESOPHAGEAL ECHOCARDIOGRAM (TEE);  Surgeon: Ivin Poot, MD;  Location: [facility];  Service: Open Heart Surgery;  Laterality: N/A;   Past Medical History:  Diagnosis Date  . 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 Colon polyps   . Coronary atherosclerosis of unspecified type of vessel, native or graft   . Difficult intubation   . Esophageal reflux   . Hypertension   . IBS (irritable bowel syndrome)   . Orthostatic hypotension    "BP has been dropping alot when I stand up for the last month or so" (02/17/2016)  . Vitamin B 12 deficiency   . Vitamin D deficiency    BP (!) 155/91   Pulse 77   SpO2 95%   Opioid Risk Score:   Fall Risk Score:  `1  Depression screen PHQ 2/9  Depression screen [facility] 2/9 03/22/2016 02/09/2016 10/29/2015 09/23/2015 08/11/2015 05/14/2015 03/31/2015  Decreased Interest 3 0 0 0 0 0 0  Down, Depressed, Hopeless 1 0 0 0 0 0 0  PHQ - 2 Score 4 0 0 0 0 0 0  Altered sleeping 2 - - - - - -  Tired, decreased energy 3 - - - - - -  Change in appetite 1 - - - - - -  Feeling bad or failure about yourself  2 - - - - - -  Trouble concentrating 2 - - - - - -  Moving slowly or fidgety/restless 1 - - - - - -  Suicidal thoughts 0 - - - - - -  PHQ-9 Score 15 - - - - - -  Difficult doing work/chores Extremely dIfficult - - - - - -    Review of Systems  Constitutional: Negative.   HENT: Negative.   Eyes: Negative.   Respiratory: Negative.   Cardiovascular: Negative.   Gastrointestinal: Negative.   Endocrine: Negative.   Genitourinary: Negative.   Musculoskeletal: Positive for back pain.  Skin: Negative.   Allergic/Immunologic: Negative.   Neurological: Positive for numbness.  Hematological: Negative.   Psychiatric/Behavioral: Negative.        Objective:   Physical Exam  General: Alert and oriented x 3, No  apparent distress HEENT:Head is normocephalic, atraumatic, PERRLA, EOMI, sclera anicteric, oral mucosa pink and moist, dentition intact, ext ear canals clear,  Neck:Supple without JVD or lymphadenopathy Heart:RRR Chest:CTA B Abdomen:Soft, non-tender, non-distended, bowel sounds positive. Extremities:No clubbing, cyanosis, or edema. Pulses are 2+ Skin:Clean and intact without signs of breakdown. CABG incision along sternum clean/intact Neuro:Pt is cognitively appropriate with normal insight, memory, and awareness. Cranial nerves 2-12 are intact. Decreased sensation to LT in both feet, does seem to  sense pain.Reflexes are 2+ in all 4's. Fine motor coordination is intact. No tremors. Motor function is grossly 5/5.  Musculoskeletal:Right rib tenderness remains much improved at  6 and 7. Back is notable for pain topalpation  at the L3-4 and L4-5 levels most prominently. His lumbar parapsinals are taut. He has a dextroscoliosis with apex around the L3 level. He was able to bend and nearly touch his toes without pain. Extension elicited pain as well as facet maneuvers bilaterally, right much more than left.  Psych:Pt's affect is appropriate. Pt is cooperative and pleasant      Assessment & Plan:  1. Chronic right chest wall pain, intercostal neuritis, ribs 6 and 7. Likely triggered by recent CABG in March  2. Chronic low back pain, thoracic levoscoliosis and lumbar dextroscoliosis. Exam, most recent imaging and history most consistent with facet source of acute right sided pain although the scoliosis is the bigger problem. 3. Diabetic polyneuropathy with ?autonomic dysfunction, hypotension   Plan: 1. Will hold on further rib injection at this time, given his improved pain  2. Gabapentin 100mg  tid per primary. 3. Voltaren gel and lidocaine gel to back/ribs.  4. Will not resume therapies as outpt given lack of response. I did provide some facet home exercises.  5. Trial of  robaxin for muscle spasms. 6. Made a referral to Dr. Letta Pate for MBB's at L3-4, L4-5. I don't believe further imaging is required at this point given his current presentation and previous imagine. 4 Follow up with me in about 2 months after MBB's. 15 minutes of face to face patient care time were spent during this visit. All questions were encouraged and answered.

## 2016-07-04 NOTE — Patient Instructions (Signed)
PLEASE FEEL FREE TO CALL OUR OFFICE WITH ANY PROBLEMS OR QUESTIONS ([phone])      

## 2016-07-07 DIAGNOSIS — M5134 Other intervertebral disc degeneration, thoracic region: Secondary | ICD-10-CM | POA: Diagnosis not present

## 2016-07-07 DIAGNOSIS — M9902 Segmental and somatic dysfunction of thoracic region: Secondary | ICD-10-CM | POA: Diagnosis not present

## 2016-07-11 LAB — HM DIABETES EYE EXAM

## 2016-07-12 DIAGNOSIS — E119 Type 2 diabetes mellitus without complications: Secondary | ICD-10-CM | POA: Diagnosis not present

## 2016-07-12 DIAGNOSIS — H35033 Hypertensive retinopathy, bilateral: Secondary | ICD-10-CM | POA: Diagnosis not present

## 2016-07-12 DIAGNOSIS — M9902 Segmental and somatic dysfunction of thoracic region: Secondary | ICD-10-CM | POA: Diagnosis not present

## 2016-07-12 DIAGNOSIS — H26492 Other secondary cataract, left eye: Secondary | ICD-10-CM | POA: Diagnosis not present

## 2016-07-12 DIAGNOSIS — M5134 Other intervertebral disc degeneration, thoracic region: Secondary | ICD-10-CM | POA: Diagnosis not present

## 2016-07-18 ENCOUNTER — Telehealth: Payer: Self-pay | Admitting: *Deleted

## 2016-07-18 MED ORDER — BUTALBITAL-APAP-CAFFEINE 50-325-40 MG PO TABS: 1.0000 | ORAL_TABLET | Freq: Four times a day (QID) | ORAL | 0 refills | Status: DC | PRN

## 2016-07-18 NOTE — Telephone Encounter (Signed)
Spoke with the patient regarding headache and neck pain.  He is requesting an RX for pain. Per Dr Melford Aase, it is OK to send in an RX for Fioricet #30 tablets.

## 2016-07-19 DIAGNOSIS — M9902 Segmental and somatic dysfunction of thoracic region: Secondary | ICD-10-CM | POA: Diagnosis not present

## 2016-07-19 DIAGNOSIS — M5134 Other intervertebral disc degeneration, thoracic region: Secondary | ICD-10-CM | POA: Diagnosis not present

## 2016-07-21 ENCOUNTER — Telehealth: Payer: Self-pay | Admitting: Physical Medicine & Rehabilitation

## 2016-07-21 ENCOUNTER — Ambulatory Visit (HOSPITAL_BASED_OUTPATIENT_CLINIC_OR_DEPARTMENT_OTHER): Payer: Medicare Other | Admitting: Physical Medicine & Rehabilitation

## 2016-07-21 ENCOUNTER — Telehealth: Payer: Self-pay | Admitting: Registered Nurse

## 2016-07-21 ENCOUNTER — Encounter: Payer: Self-pay | Admitting: Physical Medicine & Rehabilitation

## 2016-07-21 VITALS — BP 170/88 | HR 95 | Resp 14

## 2016-07-21 DIAGNOSIS — M4185 Other forms of scoliosis, thoracolumbar region: Secondary | ICD-10-CM | POA: Diagnosis not present

## 2016-07-21 DIAGNOSIS — E1142 Type 2 diabetes mellitus with diabetic polyneuropathy: Secondary | ICD-10-CM | POA: Diagnosis not present

## 2016-07-21 DIAGNOSIS — I2510 Atherosclerotic heart disease of native coronary artery without angina pectoris: Secondary | ICD-10-CM | POA: Diagnosis not present

## 2016-07-21 DIAGNOSIS — I10 Essential (primary) hypertension: Secondary | ICD-10-CM | POA: Diagnosis not present

## 2016-07-21 DIAGNOSIS — G588 Other specified mononeuropathies: Secondary | ICD-10-CM | POA: Diagnosis not present

## 2016-07-21 DIAGNOSIS — Z8249 Family history of ischemic heart disease and other diseases of the circulatory system: Secondary | ICD-10-CM | POA: Diagnosis not present

## 2016-07-21 DIAGNOSIS — M47816 Spondylosis without myelopathy or radiculopathy, lumbar region: Secondary | ICD-10-CM

## 2016-07-21 DIAGNOSIS — E785 Hyperlipidemia, unspecified: Secondary | ICD-10-CM | POA: Diagnosis not present

## 2016-07-21 DIAGNOSIS — R0789 Other chest pain: Secondary | ICD-10-CM | POA: Diagnosis not present

## 2016-07-21 DIAGNOSIS — Z79899 Other long term (current) drug therapy: Secondary | ICD-10-CM | POA: Diagnosis not present

## 2016-07-21 DIAGNOSIS — Z951 Presence of aortocoronary bypass graft: Secondary | ICD-10-CM | POA: Diagnosis not present

## 2016-07-21 DIAGNOSIS — M545 Low back pain: Secondary | ICD-10-CM | POA: Diagnosis not present

## 2016-07-21 DIAGNOSIS — Z87891 Personal history of nicotine dependence: Secondary | ICD-10-CM | POA: Diagnosis not present

## 2016-07-21 DIAGNOSIS — E559 Vitamin D deficiency, unspecified: Secondary | ICD-10-CM | POA: Diagnosis not present

## 2016-07-21 DIAGNOSIS — K219 Gastro-esophageal reflux disease without esophagitis: Secondary | ICD-10-CM | POA: Diagnosis not present

## 2016-07-21 DIAGNOSIS — Z955 Presence of coronary angioplasty implant and graft: Secondary | ICD-10-CM | POA: Diagnosis not present

## 2016-07-21 DIAGNOSIS — K589 Irritable bowel syndrome without diarrhea: Secondary | ICD-10-CM | POA: Diagnosis not present

## 2016-07-21 DIAGNOSIS — G8929 Other chronic pain: Secondary | ICD-10-CM | POA: Diagnosis not present

## 2016-07-21 MED ORDER — TRAMADOL HCL 50 MG PO TABS: 50.0000 mg | ORAL_TABLET | Freq: Three times a day (TID) | ORAL | 0 refills | Status: DC | PRN

## 2016-07-21 NOTE — Telephone Encounter (Signed)
AK saw this patient today for a procedure (Lumbar) - he has referred him back to you for follow up and referral back for a cervical procedure.  The patient cannot get your schedule until end of April and said the Voltaren is not giving him enough pain relieve - he is not sleeping.  Can you recommend a medication please for him - they do not think they can wait until end of April.  He has not historically used any thing of consequence. [name]

## 2016-07-21 NOTE — Progress Notes (Addendum)
  [facility]   Name: [name] DOB:07-May-1933 MRN: [id number]  Date:07/21/2016  Physician: Alysia Penna, MD    Nurse/CMA: Quaid Yeakle, CMA  Allergies:  Allergies  Allergen Reactions  . Cymbalta [Duloxetine Hcl]     Dizziness  . Keflex [Cephalexin] Other (See Comments)    Reaction: unknown   . Simvastatin Other (See Comments)    Reaction: unknown   . Sudafed [Pseudoephedrine]     Dizziness    Consent Signed: Yes.    Is patient diabetic? Yes.    CBG today? 38  Pregnant: No. LMP: No LMP for male patient. (age 45-55)  Anticoagulants: no Anti-inflammatory: no Antibiotics: no  Procedure: right L3-4, L4-5 medial branch block  Position: Prone Start Time: 1:00pm  End Time: 1:11pm  Fluoro Time: 33  RN/CMA Meridian Scherger, CMA Jakari Jacot, CMA    Time 12:40 pm 1:15pm    BP 170/88 183/102    Pulse 95 103    Respirations 14 14    O2 Sat 96 96    S/S 6 6    Pain Level 8/10 3/10     D/C home with wife, [name] & O X 3, D/C instructions reviewed, and sits independently.

## 2016-07-21 NOTE — Telephone Encounter (Signed)
[name] was seen in office today be Dr. Letta Pate for MBB.   [name] experiencing Thoracic Back Pain, he was instructed to use his Robaxin as ordered and Tramadol ordered per Dr. Naaman Plummer recommendation.  RX: Tramadol 50 mg Every 8 hour PRN #30, no refills.

## 2016-07-21 NOTE — Patient Instructions (Signed)
Please keep in mind that the area injected was the right low back, this would not help pain on the right mid back or left sided back pain. He may talk to Dr. Naaman Plummer if you would like to try an injection in one of these other spots.

## 2016-07-21 NOTE — Progress Notes (Signed)
Right lumbar L3, L4 medial branch blocks and L5 dorsal ramus injection under fluoroscopic guidance  Indication: Right Lumbar pain which is not relieved by medication management or other conservative care and interfering with self-care and mobility.  Informed consent was obtained after describing risks and benefits of the procedure with the patient, this includes bleeding, bruising, infection, paralysis and medication side effects. The patient wishes to proceed and has given written consent. The patient was placed in a prone position. The lumbar area was marked and prepped with Betadine. One ML of 1% lidocaine was injected into each of 3 areas into the skin and subcutaneous tissue. Then a 22-gauge 3.5inch spinal needle was inserted targeting the junction of the Right S1 superior articular process and sacral ala junction. Needle was advanced under fluoroscopic guidance. Bone contact was made.Isovue 200 was injected x0.5 mL demonstrating no intravascular uptake. Then a solution containing 2% MPF lidocaine was injected x0.5 mL. Then the Right L5 superior articular process in transverse process junction was targeted. Bone contact was made.Isovue 200 was injected x0.5 mL demonstrating no intravascular uptake. Then a solution containing 2% MPF lidocaine was injected x0.5 mL. Then the Right L4 superior articular process in transverse process junction was targeted. Bone contact was made. Isovue 200 was injected x0.5 mL demonstrating no intravascular uptake. Then a solution containing2% MPF lidocaine was injected x0.5 mL Patient tolerated procedure well. Post procedure instructions were given. Please refer to post procedure form.  Patient states that he had good relief of his right-sided low back pain postprocedure. He noticed his left-sided chest wall discomfort more per his report after his right low back pain subsided.  Patient will follow-up with Dr. Naaman Plummer next month, may be good candidate for an  ultrasound-guided intercostal nerve blocks

## 2016-07-26 NOTE — Telephone Encounter (Signed)
If there is a "knot" from a hematoma at the injection site, I would advise moist heat as much as he can tolerate. Can alternate with ice too, but heat will probably be better.

## 2016-07-26 NOTE — Telephone Encounter (Signed)
Patients wife left a message restating message regarding patients thoracic back pain.  I contacted the patient. He states that  there is a knot at one of the injection sites (probably a hematoma), tramadol is not helping the thoracic pain, robaxin helps but makes patient woozy and/or drowsy. He wakes up drowsy if he takes it at night before bedtime.  He is reluctant to take the robaxin. He asked if there is other options to help with his pain?

## 2016-08-01 DIAGNOSIS — M5134 Other intervertebral disc degeneration, thoracic region: Secondary | ICD-10-CM | POA: Diagnosis not present

## 2016-08-01 DIAGNOSIS — M9902 Segmental and somatic dysfunction of thoracic region: Secondary | ICD-10-CM | POA: Diagnosis not present

## 2016-08-01 NOTE — Telephone Encounter (Signed)
Left message on VM to call us and let us know if his issue with his back has resolved.

## 2016-08-03 DIAGNOSIS — M5134 Other intervertebral disc degeneration, thoracic region: Secondary | ICD-10-CM | POA: Diagnosis not present

## 2016-08-03 DIAGNOSIS — M9902 Segmental and somatic dysfunction of thoracic region: Secondary | ICD-10-CM | POA: Diagnosis not present

## 2016-08-04 ENCOUNTER — Encounter: Payer: Self-pay | Admitting: *Deleted

## 2016-08-04 NOTE — Telephone Encounter (Signed)
I spoke with [name] and the knot has resolved in his back.  In reference to the injection, the numbness in his feet and legs has resolved but the back pain is worse. I will forward to [name] as [name]. (see entire message thread)

## 2016-08-04 NOTE — Telephone Encounter (Signed)
error 

## 2016-08-05 ENCOUNTER — Ambulatory Visit: Payer: Self-pay

## 2016-08-05 ENCOUNTER — Ambulatory Visit: Payer: Self-pay | Admitting: Physical Medicine & Rehabilitation

## 2016-08-10 DIAGNOSIS — M9902 Segmental and somatic dysfunction of thoracic region: Secondary | ICD-10-CM | POA: Diagnosis not present

## 2016-08-10 DIAGNOSIS — M5134 Other intervertebral disc degeneration, thoracic region: Secondary | ICD-10-CM | POA: Diagnosis not present

## 2016-08-12 DIAGNOSIS — Z961 Presence of intraocular lens: Secondary | ICD-10-CM | POA: Diagnosis not present

## 2016-08-12 DIAGNOSIS — H26492 Other secondary cataract, left eye: Secondary | ICD-10-CM | POA: Diagnosis not present

## 2016-08-12 DIAGNOSIS — H18413 Arcus senilis, bilateral: Secondary | ICD-10-CM | POA: Diagnosis not present

## 2016-08-12 DIAGNOSIS — H02839 Dermatochalasis of unspecified eye, unspecified eyelid: Secondary | ICD-10-CM | POA: Diagnosis not present

## 2016-08-14 NOTE — Progress Notes (Signed)
3 MONTH FOLLOW UP Assessment:     Orthostatic hypotension - Cont florinef - increase fluids -wear compression socks/AB binder  Hyperlipidemia -continue medications, check lipids, decrease fatty foods, increase activity.  -cont lipitor   Medication management - CBC with Differential/Platelet - BASIC METABOLIC PANEL WITH GFR - Hepatic function panel   Abdominal aortic aneurysm (AAA) without rupture (HCC) -monitored by cardio and vascular  Atherosclerosis of native coronary artery of native heart with unstable angina pectoris ([place]) -followed by cardio  PVD (peripheral vascular disease) (HCC) -cont lipitor -cont CBG control   Over 30 minutes of exam, counseling, chart review, and critical decision making was performed  Future Appointments Date Time Provider [place]  08/29/2016 9:40 AM Meredith Staggers, MD [facility]  11/29/2016 9:00 AM [name], MD [facility] None      Subjective:  [name] is a 83 y.o. male who presents for  3 month follow up for HTN, hyperlipidemia, prediabetes, and vitamin D Def.   His blood pressure has been better controlled, has history of orthostatic hypotension, is on flornif, today their BP is BP: 140/84 sitting and 112/74 standing. Still complaining of dizziness, sometimes with standing and will have sensation of movement and states will feel that when he is walking he will have imbalance. He does not work out. Had ESR  With Dr. Letta Pate and feels that it made his back worse but made numbness/tingling in his back better.    He does not workout. He denies chest pain, shortness of breath.   He is on cholesterol medication and denies myalgias. His cholesterol is not at goal. The cholesterol last visit was:   Lab Results  Component Value Date   CHOL 172 05/17/2016   HDL 35 (L) 05/17/2016   LDLCALC 103 (H) 05/17/2016   TRIG 170 (H) 05/17/2016   CHOLHDL 4.9 05/17/2016  He has a history of diabetes with severe neuropathy  and CKD which is currently diet and exercise controlled.  It is worsened likely by his use of florinef.  He is currently taking this twice daily. He is not on ACE due to hypotension.   Lab Results  Component Value Date   HGBA1C 6.4 (H) 05/17/2016   Last GFR Lab Results  Component Value Date   GFRNONAA 50 (L) 05/17/2016   Patient is on Vitamin D supplement.   Lab Results  Component Value Date   VD25OH 20 05/17/2016     He reports that he is due to see a neurologist about his rib pain.  He is still taking gabapentin TID.   Medication Review: Current Outpatient Prescriptions on File Prior to Visit  Medication Sig Dispense Refill  . aspirin EC 81 MG EC tablet Take 1 tablet (81 mg total) by mouth daily.    . butalbital-acetaminophen-caffeine (FIORICET, ESGIC) 50-325-40 MG tablet Take 1-2 tablets by mouth every 6 (six) hours as needed for headache. 30 tablet 0  . CALCIUM PO Take 1 tablet by mouth daily.    . Cholecalciferol (VITAMIN D PO) Take 5,000 Units by mouth 2 (two) times daily.    . cyanocobalamin 1000 MCG tablet Take 1,000 mcg by mouth daily.    . diazepam (VALIUM) 5 MG tablet Take 1/2 to 1 tablet 2 - 3 x /day for neck pain / Headache 60 tablet 2  . diclofenac sodium (VOLTAREN) 1 % GEL Apply 1 application topically 3 (three) times daily. Ribs, back. 3 Tube 4  . Flaxseed, Linseed, 1000 MG CAPS Take 1,300 mg  by mouth daily.     . fludrocortisone (FLORINEF) 0.1 MG tablet Take 2 tablets (200 mcg total) by mouth daily. 60 tablet 3  . fluorouracil (EFUDEX) 5 % cream     . lidocaine (XYLOCAINE) 5 % ointment Apply 1 application topically as needed. To ribs, back 35.44 g 0  . Magnesium 250 MG TABS Take 500 mg by mouth daily.     . methocarbamol (ROBAXIN) 500 MG tablet Take 1 tablet (500 mg total) by mouth every 6 (six) hours as needed for muscle spasms. 60 tablet 2  . multivitamin (THERAGRAN) per tablet Take 1 tablet by mouth daily.      Marland Kitchen omeprazole (PRILOSEC) 20 MG capsule TAKE 2  CAPSULES BY MOUTH EVERY DAY FOR INDIGESTION 180 capsule 0  . traMADol (ULTRAM) 50 MG tablet Take 1 tablet (50 mg total) by mouth every 8 (eight) hours as needed. 30 tablet 0   No current facility-administered medications on file prior to visit.     Allergies: Allergies  Allergen Reactions  . Cymbalta [Duloxetine Hcl]     Dizziness  . Keflex [Cephalexin] Other (See Comments)    Reaction: unknown   . Simvastatin Other (See Comments)    Reaction: unknown   . Sudafed [Pseudoephedrine]     Dizziness    Current Problems (verified) has Coronary atherosclerosis- s/p PCI to LAD in 2009 and PCI to RCA in 2011; Abdominal aortic aneurysm ([place]); GERD; History of IBS; Supine hypertension; Vitamin D deficiency; Vitamin B 12 deficiency; Mixed hyperlipidemia; Prediabetes; PVD (peripheral vascular disease) ([place]); BMI 25.0-25.9,adult; NSTEMI (non-ST elevated myocardial infarction) ([place]); S/P CABG x 4; Orthostatic hypotension; Medication management; Intercostal neuralgia; Neuropathic pain of foot; Coronary artery disease involving coronary bypass graft of native heart without angina pectoris; Diabetes mellitus type 2, diet-controlled ([place]); CKD (chronic kidney disease), stage III; Dysautonomia orthostatic hypotension syndrome ([place]); Idiopathic scoliosis; and Diabetic neuropathy ([place]) on his problem list.  Review of Systems  Constitutional: Negative for chills, fever, malaise/fatigue and weight loss.  HENT: Negative for congestion, ear pain, nosebleeds and sore throat.   Eyes: Negative.   Respiratory: Negative for cough, shortness of breath and wheezing.   Cardiovascular: Negative for chest pain, palpitations and leg swelling.  Gastrointestinal: Negative for blood in stool, constipation, diarrhea, heartburn, melena, nausea and vomiting.  Genitourinary: Negative.  Negative for dysuria, frequency, hematuria and urgency.  Musculoskeletal: Positive for back pain.  Skin: Negative.   Neurological: Positive for  dizziness. Negative for tingling, tremors, sensory change, speech change, focal weakness, seizures, loss of consciousness and headaches.  Psychiatric/Behavioral: Negative for depression. The patient is not nervous/anxious and does not have insomnia.   All other systems reviewed and are negative.    Objective:   Today's Vitals   08/15/16 0947  BP: 140/84  Pulse: 88  Resp: 16  Temp: 97.3 F (36.3 C)  TempSrc: Temporal   There is no height or weight on file to calculate BMI.  General appearance: alert, no distress, WD/WN, male HEENT: normocephalic, sclerae anicteric, TMs pearly, nares patent, no discharge or erythema, pharynx normal Oral cavity: MMM, no lesions Neck: supple, no lymphadenopathy, no thyromegaly, no masses Heart: RRR, normal S1, S2, no murmurs Lungs: CTA bilaterally, no wheezes, rhonchi, or rales Abdomen: +bs, soft, non tender, non distended, no masses, no hepatomegaly, no splenomegaly Musculoskeletal: nontender, no swelling, no obvious deformity Extremities: no edema, no cyanosis, no clubbing Pulses: 2+ symmetric, upper and lower extremities, normal cap refill Neurological: alert, oriented x 3, CN2-12 intact, strength normal upper  extremities and lower extremities, sensation normal throughout, DTRs 2+ throughout, no cerebellar signs, gait normal Psychiatric: normal affect, behavior normal, pleasant    Vicie Mutters, PA-C   08/15/2016

## 2016-08-15 ENCOUNTER — Encounter: Payer: Self-pay | Admitting: Physician Assistant

## 2016-08-15 ENCOUNTER — Ambulatory Visit (INDEPENDENT_AMBULATORY_CARE_PROVIDER_SITE_OTHER): Payer: Medicare Other | Admitting: Physician Assistant

## 2016-08-15 ENCOUNTER — Ambulatory Visit: Payer: Self-pay | Admitting: Internal Medicine

## 2016-08-15 ENCOUNTER — Ambulatory Visit: Payer: Self-pay | Admitting: Physician Assistant

## 2016-08-15 VITALS — BP 140/84 | HR 88 | Temp 97.3°F | Resp 16

## 2016-08-15 DIAGNOSIS — G903 Multi-system degeneration of the autonomic nervous system: Secondary | ICD-10-CM

## 2016-08-15 DIAGNOSIS — E1342 Other specified diabetes mellitus with diabetic polyneuropathy: Secondary | ICD-10-CM | POA: Diagnosis not present

## 2016-08-15 DIAGNOSIS — E782 Mixed hyperlipidemia: Secondary | ICD-10-CM

## 2016-08-15 DIAGNOSIS — I714 Abdominal aortic aneurysm, without rupture, unspecified: Secondary | ICD-10-CM

## 2016-08-15 DIAGNOSIS — Z79899 Other long term (current) drug therapy: Secondary | ICD-10-CM | POA: Diagnosis not present

## 2016-08-15 DIAGNOSIS — E538 Deficiency of other specified B group vitamins: Secondary | ICD-10-CM | POA: Diagnosis not present

## 2016-08-15 DIAGNOSIS — E119 Type 2 diabetes mellitus without complications: Secondary | ICD-10-CM

## 2016-08-15 DIAGNOSIS — I739 Peripheral vascular disease, unspecified: Secondary | ICD-10-CM

## 2016-08-15 DIAGNOSIS — I951 Orthostatic hypotension: Secondary | ICD-10-CM

## 2016-08-15 LAB — BASIC METABOLIC PANEL WITH GFR
BUN: 17 mg/dL (ref 7–25)
CO2: 32 mmol/L — ABNORMAL HIGH (ref 20–31)
Calcium: 9 mg/dL (ref 8.6–10.3)
Chloride: 102 mmol/L (ref 98–110)
Creat: 1.2 mg/dL — ABNORMAL HIGH (ref 0.70–1.11)
GFR, Est African American: 65 mL/min (ref >=60)
GFR, Est Non African American: 56 mL/min — ABNORMAL LOW (ref >=60)
Glucose, Bld: 101 mg/dL — ABNORMAL HIGH (ref 65–99)
Potassium: 3.8 mmol/L (ref 3.5–5.3)
Sodium: 142 mmol/L (ref 135–146)

## 2016-08-15 LAB — HEPATIC FUNCTION PANEL
ALT: 8 U/L — ABNORMAL LOW (ref 9–46)
AST: 17 U/L (ref 10–35)
Albumin: 3.8 g/dL (ref 3.6–5.1)
Alkaline Phosphatase: 78 U/L (ref 40–115)
Bilirubin, Direct: 0.1 mg/dL (ref <=0.2)
Indirect Bilirubin: 0.3 mg/dL (ref 0.2–1.2)
Total Bilirubin: 0.4 mg/dL (ref 0.2–1.2)
Total Protein: 6.4 g/dL (ref 6.1–8.1)

## 2016-08-15 LAB — LIPID PANEL
Cholesterol: 176 mg/dL (ref <200)
HDL: 32 mg/dL — ABNORMAL LOW (ref >40)
LDL Cholesterol: 112 mg/dL — ABNORMAL HIGH (ref <100)
Total CHOL/HDL Ratio: 5.5 Ratio — ABNORMAL HIGH (ref <5.0)
Triglycerides: 161 mg/dL — ABNORMAL HIGH (ref <150)
VLDL: 32 mg/dL — ABNORMAL HIGH (ref <30)

## 2016-08-15 LAB — CBC WITH DIFFERENTIAL/PLATELET
Basophils Absolute: 76 cells/uL (ref 0–200)
Basophils Relative: 1 %
Eosinophils Absolute: 304 cells/uL (ref 15–500)
Eosinophils Relative: 4 %
HCT: 37.7 % — ABNORMAL LOW (ref 38.5–50.0)
Hemoglobin: 12 g/dL — ABNORMAL LOW (ref 13.2–17.1)
Lymphocytes Relative: 25 %
Lymphs Abs: 1900 cells/uL (ref 850–3900)
MCH: 29.2 pg (ref 27.0–33.0)
MCHC: 31.8 g/dL — ABNORMAL LOW (ref 32.0–36.0)
MCV: 91.7 fL (ref 80.0–100.0)
MPV: 9.1 fL (ref 7.5–12.5)
Monocytes Absolute: 456 cells/uL (ref 200–950)
Monocytes Relative: 6 %
Neutro Abs: 4864 cells/uL (ref 1500–7800)
Neutrophils Relative %: 64 %
Platelets: 228 10*3/uL (ref 140–400)
RBC: 4.11 MIL/uL — ABNORMAL LOW (ref 4.20–5.80)
RDW: 15.1 % — ABNORMAL HIGH (ref 11.0–15.0)
WBC: 7.6 10*3/uL (ref 3.8–10.8)

## 2016-08-15 LAB — TSH: TSH: 2.8 mIU/L (ref 0.40–4.50)

## 2016-08-15 NOTE — Patient Instructions (Signed)
VERY IMPORTANT TO  Wear compression stockings Wear your AB binder Actually measure out your fluids in a day, at least 80 oz a day  Check to see if you have any valium at home, can take 1/2-1 pill for back and AND for DIZZINESS/VERTIGO  Keep seeing your pain management   Dizziness Dizziness is a common problem. It is a feeling of unsteadiness or light-headedness. You may feel like you are about to faint. Dizziness can lead to injury if you stumble or fall. Anyone can become dizzy, but dizziness is more common in older adults. This condition can be caused by a number of things, including medicines, dehydration, or illness. Follow these instructions at home: Taking these steps may help with your condition: Eating and drinking   Drink enough fluid to keep your urine clear or pale yellow. This helps to keep you from becoming dehydrated. Try to drink more clear fluids, such as water.  Do not drink alcohol.  Limit your caffeine intake if directed by your health care provider.  Limit your salt intake if directed by your health care provider. Activity   Avoid making quick movements.  Rise slowly from chairs and steady yourself until you feel okay.  In the morning, first sit up on the side of the bed. When you feel okay, stand slowly while you hold onto something until you know that your balance is fine.  Move your legs often if you need to stand in one place for a long time. Tighten and relax your muscles in your legs while you are standing.  Do not drive or operate heavy machinery if you feel dizzy.  Avoid bending down if you feel dizzy. Place items in your home so that they are easy for you to reach without leaning over. Lifestyle   Do not use any tobacco products, including cigarettes, chewing tobacco, or electronic cigarettes. If you need help quitting, ask your health care provider.  Try to reduce your stress level, such as with yoga or meditation. Talk with your health care  provider if you need help. General instructions   Watch your dizziness for any changes.  Take medicines only as directed by your health care provider. Talk with your health care provider if you think that your dizziness is caused by a medicine that you are taking.  Tell a friend or a family member that you are feeling dizzy. If he or she notices any changes in your behavior, have this person call your health care provider.  Keep all follow-up visits as directed by your health care provider. This is important. Contact a health care provider if:  Your dizziness does not go away.  Your dizziness or light-headedness gets worse.  You feel nauseous.  You have reduced hearing.  You have new symptoms.  You are unsteady on your feet or you feel like the room is spinning. Get help right away if:  You vomit or have diarrhea and are unable to eat or drink anything.  You have problems talking, walking, swallowing, or using your arms, hands, or legs.  You feel generally weak.  You are not thinking clearly or you have trouble forming sentences. It may take a friend or family member to notice this.  You have chest pain, abdominal pain, shortness of breath, or sweating.  Your vision changes.  You notice any bleeding.  You have a headache.  You have neck pain or a stiff neck.  You have a fever. This information is not intended to  replace advice given to you by your health care provider. Make sure you discuss any questions you have with your health care provider. Document Released: 10/12/2000 Document Revised: 09/24/2015 Document Reviewed: 04/14/2014 Elsevier Interactive Patient Education  2017 [employer].

## 2016-08-16 LAB — HEMOGLOBIN A1C
Hgb A1c MFr Bld: 5.8 % — ABNORMAL HIGH (ref <5.7)
Mean Plasma Glucose: 120 mg/dL

## 2016-08-16 LAB — MAGNESIUM: Magnesium: 2.1 mg/dL (ref 1.5–2.5)

## 2016-08-16 NOTE — Progress Notes (Signed)
Pt aware of lab results & voiced understanding of those results.

## 2016-08-18 DIAGNOSIS — H26492 Other secondary cataract, left eye: Secondary | ICD-10-CM | POA: Diagnosis not present

## 2016-08-20 ENCOUNTER — Other Ambulatory Visit: Payer: Self-pay | Admitting: Internal Medicine

## 2016-08-24 DIAGNOSIS — M5134 Other intervertebral disc degeneration, thoracic region: Secondary | ICD-10-CM | POA: Diagnosis not present

## 2016-08-24 DIAGNOSIS — M9902 Segmental and somatic dysfunction of thoracic region: Secondary | ICD-10-CM | POA: Diagnosis not present

## 2016-08-29 ENCOUNTER — Encounter: Payer: Medicare Other | Attending: Physical Medicine & Rehabilitation | Admitting: Physical Medicine & Rehabilitation

## 2016-08-29 ENCOUNTER — Encounter: Payer: Self-pay | Admitting: Physical Medicine & Rehabilitation

## 2016-08-29 VITALS — BP 124/82 | HR 94

## 2016-08-29 DIAGNOSIS — G588 Other specified mononeuropathies: Secondary | ICD-10-CM | POA: Insufficient documentation

## 2016-08-29 DIAGNOSIS — Z87891 Personal history of nicotine dependence: Secondary | ICD-10-CM | POA: Diagnosis not present

## 2016-08-29 DIAGNOSIS — Z8249 Family history of ischemic heart disease and other diseases of the circulatory system: Secondary | ICD-10-CM | POA: Diagnosis not present

## 2016-08-29 DIAGNOSIS — M47816 Spondylosis without myelopathy or radiculopathy, lumbar region: Secondary | ICD-10-CM | POA: Insufficient documentation

## 2016-08-29 DIAGNOSIS — E559 Vitamin D deficiency, unspecified: Secondary | ICD-10-CM | POA: Insufficient documentation

## 2016-08-29 DIAGNOSIS — K589 Irritable bowel syndrome without diarrhea: Secondary | ICD-10-CM | POA: Diagnosis not present

## 2016-08-29 DIAGNOSIS — Z951 Presence of aortocoronary bypass graft: Secondary | ICD-10-CM | POA: Insufficient documentation

## 2016-08-29 DIAGNOSIS — R0789 Other chest pain: Secondary | ICD-10-CM | POA: Diagnosis not present

## 2016-08-29 DIAGNOSIS — M545 Low back pain: Secondary | ICD-10-CM | POA: Diagnosis not present

## 2016-08-29 DIAGNOSIS — I10 Essential (primary) hypertension: Secondary | ICD-10-CM | POA: Insufficient documentation

## 2016-08-29 DIAGNOSIS — I2510 Atherosclerotic heart disease of native coronary artery without angina pectoris: Secondary | ICD-10-CM | POA: Diagnosis not present

## 2016-08-29 DIAGNOSIS — M4185 Other forms of scoliosis, thoracolumbar region: Secondary | ICD-10-CM | POA: Diagnosis not present

## 2016-08-29 DIAGNOSIS — M4126 Other idiopathic scoliosis, lumbar region: Secondary | ICD-10-CM | POA: Diagnosis not present

## 2016-08-29 DIAGNOSIS — E785 Hyperlipidemia, unspecified: Secondary | ICD-10-CM | POA: Diagnosis not present

## 2016-08-29 DIAGNOSIS — G8929 Other chronic pain: Secondary | ICD-10-CM | POA: Insufficient documentation

## 2016-08-29 DIAGNOSIS — Z79899 Other long term (current) drug therapy: Secondary | ICD-10-CM | POA: Insufficient documentation

## 2016-08-29 DIAGNOSIS — M4696 Unspecified inflammatory spondylopathy, lumbar region: Secondary | ICD-10-CM | POA: Diagnosis not present

## 2016-08-29 DIAGNOSIS — Z955 Presence of coronary angioplasty implant and graft: Secondary | ICD-10-CM | POA: Diagnosis not present

## 2016-08-29 DIAGNOSIS — E1142 Type 2 diabetes mellitus with diabetic polyneuropathy: Secondary | ICD-10-CM | POA: Diagnosis not present

## 2016-08-29 DIAGNOSIS — K219 Gastro-esophageal reflux disease without esophagitis: Secondary | ICD-10-CM | POA: Insufficient documentation

## 2016-08-29 MED ORDER — TRAMADOL HCL 50 MG PO TABS: 50.0000 mg | ORAL_TABLET | Freq: Three times a day (TID) | ORAL | 0 refills | Status: DC | PRN

## 2016-08-29 NOTE — Progress Notes (Signed)
Subjective:    Patient ID: [name], male    DOB: 1934/02/20, 83 y.o.   MRN: [id number]  HPI   [name] is here in follow up of his chronic back pain. He states that the MBB's helped his right leg pain although his low back pain is no different. He feels that the right rib pain has started to resurface and now since the back injections his left ribs are bothering him with radiation to the axilla and back.   His low back pain bothers him most when he's active. If he sits down after walking a few minutes, his back pain will resolve. He is using tramadol for more severe pain. He's using ice frequently with some benefit.   Pain Inventory Average Pain 7 Pain Right Now 8 My pain is sharp and stabbing  In the last 24 hours, has pain interfered with the following? General activity 2 Relation with others 4 Enjoyment of life 3 What TIME of day is your pain at its worst? morning Sleep (in general) Fair  Pain is worse with: walking and some activites Pain improves with: heat/ice Relief from Meds: 0  Mobility walk with assistance use a cane how many minutes can you walk? 10 do you drive?  yes  Function retired  Neuro/Psych spasms dizziness  Prior Studies Any changes since last visit?  no  Physicians involved in your care Any changes since last visit?  no   Family History  Problem Relation Age of Onset  . Heart attack Father     died age 76  . Heart attack Brother     died age 10  . Heart attack Sister     died age 100  . Colon cancer Sister   . Liver cancer Sister   . Diabetes Maternal Grandmother   . Colon polyps Sister     and brothers x 2    Social History   Social History  . Marital status: Married    Spouse name: N/A  . Number of children: 4   . Years of education: N/A   Occupational History  . Retired    Social History Main Topics  . Smoking status: Former Smoker    Quit date: 07/16/1963  . Smokeless tobacco: Never Used  . Alcohol use No   . Drug use: No  . Sexual activity: Not Currently   Other Topics Concern  . Not on file   Social History Narrative   1 caffeine drink daily    Past Surgical History:  Procedure Laterality Date  . BLADDER SURGERY  1969   traumatic pelvic fractures, urethral and bladder repair  . CARDIAC CATHETERIZATION N/A 07/01/2015   Procedure: Left Heart Cath and Coronary Angiography;  Surgeon: Wellington Hampshire, MD;  Location: [facility];  Service: Cardiovascular;  Laterality: N/A;  . CORONARY ANGIOPLASTY WITH STENT PLACEMENT    . CORONARY ARTERY BYPASS GRAFT N/A 07/06/2015   Procedure: CORONARY ARTERY BYPASS GRAFTING (CABG)x 4   utilizing the left internal mammary artery and endoscopically harvested bilateral  sapheneous vein.;  Surgeon: Ivin Poot, MD;  Location: [facility];  Service: Open Heart Surgery;  Laterality: N/A;  . KNEE SURGERY    . TEE WITHOUT CARDIOVERSION N/A 07/06/2015   Procedure: TRANSESOPHAGEAL ECHOCARDIOGRAM (TEE);  Surgeon: Ivin Poot, MD;  Location: [facility];  Service: Open Heart Surgery;  Laterality: N/A;   Past Medical History:  Diagnosis Date  . 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  Colon polyps   . Coronary atherosclerosis of unspecified type of vessel, native or graft   . Difficult intubation   . Esophageal reflux   . Hypertension   . IBS (irritable bowel syndrome)   . Orthostatic hypotension    "BP has been dropping alot when I stand up for the last month or so" (02/17/2016)  . Vitamin B 12 deficiency   . Vitamin D deficiency    There were no vitals taken for this visit.  Opioid Risk Score:   Fall Risk Score:  `1  Depression screen PHQ 2/9  Depression screen [facility] 2/9 03/22/2016 02/09/2016 10/29/2015 09/23/2015 08/11/2015 05/14/2015 03/31/2015  Decreased Interest 3 0 0 0 0 0 0  Down, Depressed, Hopeless 1 0 0 0 0 0 0  PHQ - 2 Score 4 0 0 0 0 0 0  Altered sleeping 2 - - - - - -  Tired, decreased energy 3 - - - - - -  Change in appetite 1 - - - - - -  Feeling  bad or failure about yourself  2 - - - - - -  Trouble concentrating 2 - - - - - -  Moving slowly or fidgety/restless 1 - - - - - -  Suicidal thoughts 0 - - - - - -  PHQ-9 Score 15 - - - - - -  Difficult doing work/chores Extremely dIfficult - - - - - -    Review of Systems  Constitutional: Negative.   HENT: Negative.   Eyes: Negative.   Respiratory: Negative.   Cardiovascular: Negative.   Gastrointestinal: Negative.   Endocrine: Negative.   Genitourinary: Negative.   Musculoskeletal: Positive for back pain.  Skin: Negative.   Allergic/Immunologic: Negative.   Neurological: Positive for dizziness.  Hematological: Negative.   Psychiatric/Behavioral: Negative.        Objective:   Physical Exam  General: Alert and oriented x 3, No apparent distress HEENT:Head is normocephalic, atraumatic, PERRLA, EOMI, sclera anicteric, oral mucosa pink and moist, dentition intact, ext ear canals clear,  Neck:Supple without JVD or lymphadenopathy Heart:RRR Chest:CTA B Abdomen:Soft, non-tender, non-distended, bowel sounds positive. Extremities:No clubbing, cyanosis, or edema. Pulses are 2+ Skin:Clean and intact without signs of breakdown. CABG incision along sternum clean/intact Neuro:Pt is cognitively appropriate with normal insight, memory, and awareness. Cranial nerves 2-12 are intact. Decreased sensation to LT in both feet, does seem to sense pain.Reflexes are 2+ in all 4's. Fine motor coordination is intact. No tremors. Motor function is grossly 5/5.  Musculoskeletal:Right rib tenderness remains  at  6 and 7.  Similar tenderness at 8-9. LB tender at the L3-4 and L4-5 levels with palpation. He has a dextroscoliosis with apex around the L3 level. He was able to bend and nearly touch his toes without pain. Facet maneuvers positive. Psych:Pt's affect is appropriate. Pt is cooperative and pleasant         1. Chronic right chest wall pain, intercostal neuritis, ribs 6 and 7.  Likely triggered by recent CABG in March. Pain is recurrent and pt also having tenderness on the left at 8-9.  2. Chronic low back pain, thoracic levoscoliosis and lumbar dextroscoliosis. Equivocal results with MBB's except for relief of right leg pain!!??  3. Diabetic polyneuropathy with ?autonomic dysfunction, hypotension   Plan: 1. After informed consent and preparation of the skin with betadine and isopropyl alcohol, I injected 6mg  (1cc) of celestone and 2cc of 1% lidocaine around/under the right 6th rib and left 8th via anterolateral approach.  Additionally, aspiration was performed prior to injection. The patient tolerated well, and no complications were encountered. Afterward the area was cleaned and dressed. Post- injection instructions were provided. I provided rib stretching exercises today as well .   2. Gabapentin 100mg  tid per primary. 3. Voltaren gel and lidocaine gel to back/ribs. Utilize ice. May try some other topical remedies such as topricin cream 4. Continue with good posture and facet home exercises.  5. Trial of robaxin for muscle spasms. 6. Given his lack of benefit with the MBB's I will pursue an MRI of his lumbar spine to assess his facets and discs in more detail.  7. Follow up with me in about 6 weeks. 40minutes of face to face patient care time were spent during this visit. All questions were encouraged and answered. Greater than 50% of time during this encounter was spent counseling patient/family in regard to etiologies of pain, review of treatment options, etc.           Assessment & Plan:

## 2016-08-29 NOTE — Patient Instructions (Signed)
PLEASE FEEL FREE TO CALL OUR OFFICE WITH ANY PROBLEMS OR QUESTIONS ([phone])      

## 2016-08-31 ENCOUNTER — Encounter: Payer: Self-pay | Admitting: Internal Medicine

## 2016-09-07 DIAGNOSIS — M9902 Segmental and somatic dysfunction of thoracic region: Secondary | ICD-10-CM | POA: Diagnosis not present

## 2016-09-07 DIAGNOSIS — M5134 Other intervertebral disc degeneration, thoracic region: Secondary | ICD-10-CM | POA: Diagnosis not present

## 2016-09-13 ENCOUNTER — Telehealth: Payer: Self-pay | Admitting: Physical Medicine & Rehabilitation

## 2016-09-13 ENCOUNTER — Ambulatory Visit
Admission: RE | Admit: 2016-09-13 | Discharge: 2016-09-13 | Disposition: A | Discharge status: Home / Self Care | Payer: Medicare Other | Source: Ambulatory Visit | Attending: Physical Medicine & Rehabilitation | Admitting: Physical Medicine & Rehabilitation

## 2016-09-13 DIAGNOSIS — M4126 Other idiopathic scoliosis, lumbar region: Secondary | ICD-10-CM

## 2016-09-13 DIAGNOSIS — M47816 Spondylosis without myelopathy or radiculopathy, lumbar region: Secondary | ICD-10-CM

## 2016-09-13 DIAGNOSIS — M4807 Spinal stenosis, lumbosacral region: Secondary | ICD-10-CM | POA: Diagnosis not present

## 2016-09-13 NOTE — Telephone Encounter (Signed)
Significant findings from MRI:   L3-4: Small broad-based disc bulge and endplate spurring. Mild facet arthropathy and ligamentum flavum redundancy. Mild canal stenosis though partial effacement LEFT lateral recess could affect the traversing LEFT L4 nerve. Moderate LEFT neural foraminal narrowing.  L4-5: 5 mm broad-based disc bulge asymmetric to the RIGHT may affect the exited RIGHT L4 nerve. Mild facet arthropathy and ligamentum flavum redundancy. Moderate canal stenosis. Moderate to severe RIGHT, severe LEFT neural foraminal narrowing.

## 2016-09-21 ENCOUNTER — Other Ambulatory Visit: Payer: Self-pay | Admitting: Internal Medicine

## 2016-09-21 DIAGNOSIS — M9902 Segmental and somatic dysfunction of thoracic region: Secondary | ICD-10-CM | POA: Diagnosis not present

## 2016-09-21 DIAGNOSIS — M5134 Other intervertebral disc degeneration, thoracic region: Secondary | ICD-10-CM | POA: Diagnosis not present

## 2016-10-05 DIAGNOSIS — M9902 Segmental and somatic dysfunction of thoracic region: Secondary | ICD-10-CM | POA: Diagnosis not present

## 2016-10-05 DIAGNOSIS — M5134 Other intervertebral disc degeneration, thoracic region: Secondary | ICD-10-CM | POA: Diagnosis not present

## 2016-10-10 ENCOUNTER — Encounter: Payer: Self-pay | Admitting: Physical Medicine & Rehabilitation

## 2016-10-10 ENCOUNTER — Encounter: Payer: Medicare Other | Attending: Physical Medicine & Rehabilitation | Admitting: Physical Medicine & Rehabilitation

## 2016-10-10 VITALS — BP 133/84 | HR 102

## 2016-10-10 DIAGNOSIS — Z955 Presence of coronary angioplasty implant and graft: Secondary | ICD-10-CM | POA: Insufficient documentation

## 2016-10-10 DIAGNOSIS — M4185 Other forms of scoliosis, thoracolumbar region: Secondary | ICD-10-CM | POA: Insufficient documentation

## 2016-10-10 DIAGNOSIS — E559 Vitamin D deficiency, unspecified: Secondary | ICD-10-CM | POA: Diagnosis not present

## 2016-10-10 DIAGNOSIS — M5136 Other intervertebral disc degeneration, lumbar region: Secondary | ICD-10-CM | POA: Diagnosis not present

## 2016-10-10 DIAGNOSIS — M51369 Other intervertebral disc degeneration, lumbar region without mention of lumbar back pain or lower extremity pain: Secondary | ICD-10-CM | POA: Insufficient documentation

## 2016-10-10 DIAGNOSIS — E1142 Type 2 diabetes mellitus with diabetic polyneuropathy: Secondary | ICD-10-CM | POA: Insufficient documentation

## 2016-10-10 DIAGNOSIS — M4126 Other idiopathic scoliosis, lumbar region: Secondary | ICD-10-CM | POA: Diagnosis not present

## 2016-10-10 DIAGNOSIS — G588 Other specified mononeuropathies: Secondary | ICD-10-CM | POA: Insufficient documentation

## 2016-10-10 DIAGNOSIS — Z87891 Personal history of nicotine dependence: Secondary | ICD-10-CM | POA: Insufficient documentation

## 2016-10-10 DIAGNOSIS — E785 Hyperlipidemia, unspecified: Secondary | ICD-10-CM | POA: Diagnosis not present

## 2016-10-10 DIAGNOSIS — M4696 Unspecified inflammatory spondylopathy, lumbar region: Secondary | ICD-10-CM | POA: Diagnosis not present

## 2016-10-10 DIAGNOSIS — K589 Irritable bowel syndrome without diarrhea: Secondary | ICD-10-CM | POA: Diagnosis not present

## 2016-10-10 DIAGNOSIS — K219 Gastro-esophageal reflux disease without esophagitis: Secondary | ICD-10-CM | POA: Diagnosis not present

## 2016-10-10 DIAGNOSIS — M545 Low back pain: Secondary | ICD-10-CM | POA: Diagnosis not present

## 2016-10-10 DIAGNOSIS — Z8249 Family history of ischemic heart disease and other diseases of the circulatory system: Secondary | ICD-10-CM | POA: Diagnosis not present

## 2016-10-10 DIAGNOSIS — I2510 Atherosclerotic heart disease of native coronary artery without angina pectoris: Secondary | ICD-10-CM | POA: Diagnosis not present

## 2016-10-10 DIAGNOSIS — Z951 Presence of aortocoronary bypass graft: Secondary | ICD-10-CM | POA: Insufficient documentation

## 2016-10-10 DIAGNOSIS — Z79899 Other long term (current) drug therapy: Secondary | ICD-10-CM | POA: Insufficient documentation

## 2016-10-10 DIAGNOSIS — G8929 Other chronic pain: Secondary | ICD-10-CM | POA: Insufficient documentation

## 2016-10-10 DIAGNOSIS — M47816 Spondylosis without myelopathy or radiculopathy, lumbar region: Secondary | ICD-10-CM

## 2016-10-10 DIAGNOSIS — I10 Essential (primary) hypertension: Secondary | ICD-10-CM | POA: Diagnosis not present

## 2016-10-10 DIAGNOSIS — R0789 Other chest pain: Secondary | ICD-10-CM | POA: Insufficient documentation

## 2016-10-10 NOTE — Patient Instructions (Signed)
PLEASE FEEL FREE TO CALL OUR OFFICE WITH ANY PROBLEMS OR QUESTIONS ([phone])      

## 2016-10-10 NOTE — Progress Notes (Signed)
Subjective:    Patient ID: [name], male    DOB: 1933/05/20, 83 y.o.   MRN: [id number]  HPI   Wendy is here in follow up of his chronic back pain. I performed a rib injection last month which helped his chest wall pain. He states that his low back and right leg still bother him as does his left chest wall to a lesser extent. The pain is ok at rest but bothers him with basic activity.   I ordered an MRI of his lumbar spine which revealed the following:   L2-3: Small broad-based disc bulge. Mild facet arthropathy and ligamentum flavum redundancy without canal stenosis or neural foraminal narrowing.  L3-4: Small broad-based disc bulge and endplate spurring. Mild facet arthropathy and ligamentum flavum redundancy. Mild canal stenosis though partial effacement LEFT lateral recess could affect the traversing LEFT L4 nerve. Moderate LEFT neural foraminal narrowing.  L4-5: 5 mm broad-based disc bulge asymmetric to the RIGHT may affect the exited RIGHT L4 nerve. Mild facet arthropathy and ligamentum flavum redundancy. Moderate canal stenosis. Moderate to severe RIGHT, severe LEFT neural foraminal narrowing.  L5-S1: 4 mm broad-based disc bulge. Mild facet arthropathy without canal stenosis. Moderate RIGHT, mild LEFT neural foraminal narrowing.     Pain Inventory Average Pain 6 Pain Right Now 5 My pain is .  In the last 24 hours, has pain interfered with the following? General activity 2 Relation with others 4 Enjoyment of life 3 What TIME of day is your pain at its worst? daytime Sleep (in general) Fair  Pain is worse with: walking and standing Pain improves with: rest Relief from Meds: .  Mobility walk without assistance use a cane ability to climb steps?  yes do you drive?  yes  Function retired  Neuro/Psych numbness dizziness  Prior Studies Any changes since last visit?  no  Physicians involved in your care Any changes since last visit?   no   Family History  Problem Relation Age of Onset  . Heart attack Father        died age 98  . Heart attack Brother        died age 84  . Heart attack Sister        died age 14  . Colon cancer Sister   . Liver cancer Sister   . Diabetes Maternal Grandmother   . Colon polyps Sister        and brothers x 2    Social History   Social History  . Marital status: Married    Spouse name: N/A  . Number of children: 4   . Years of education: N/A   Occupational History  . Retired    Social History Main Topics  . Smoking status: Former Smoker    Quit date: 07/16/1963  . Smokeless tobacco: Never Used  . Alcohol use No  . Drug use: No  . Sexual activity: Not Currently   Other Topics Concern  . Not on file   Social History Narrative   1 caffeine drink daily    Past Surgical History:  Procedure Laterality Date  . BLADDER SURGERY  1969   traumatic pelvic fractures, urethral and bladder repair  . CARDIAC CATHETERIZATION N/A 07/01/2015   Procedure: Left Heart Cath and Coronary Angiography;  Surgeon: Wellington Hampshire, MD;  Location: [facility];  Service: Cardiovascular;  Laterality: N/A;  . CORONARY ANGIOPLASTY WITH STENT PLACEMENT    . CORONARY ARTERY BYPASS GRAFT N/A 07/06/2015  Procedure: CORONARY ARTERY BYPASS GRAFTING (CABG)x 4   utilizing the left internal mammary artery and endoscopically harvested bilateral  sapheneous vein.;  Surgeon: Ivin Poot, MD;  Location: [facility];  Service: Open Heart Surgery;  Laterality: N/A;  . KNEE SURGERY    . TEE WITHOUT CARDIOVERSION N/A 07/06/2015   Procedure: TRANSESOPHAGEAL ECHOCARDIOGRAM (TEE);  Surgeon: Ivin Poot, MD;  Location: [facility];  Service: Open Heart Surgery;  Laterality: N/A;   Past Medical History:  Diagnosis Date  . 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 Colon polyps   . Coronary atherosclerosis of unspecified type of vessel, native or graft   . Difficult intubation   . Esophageal reflux   . Hypertension   . IBS  (irritable bowel syndrome)   . Orthostatic hypotension    "BP has been dropping alot when I stand up for the last month or so" (02/17/2016)  . Vitamin B 12 deficiency   . Vitamin D deficiency    There were no vitals taken for this visit.  Opioid Risk Score:   Fall Risk Score:  `1  Depression screen PHQ 2/9  Depression screen [facility] 2/9 03/22/2016 02/09/2016 10/29/2015 09/23/2015 08/11/2015 05/14/2015 03/31/2015  Decreased Interest 3 0 0 0 0 0 0  Down, Depressed, Hopeless 1 0 0 0 0 0 0  PHQ - 2 Score 4 0 0 0 0 0 0  Altered sleeping 2 - - - - - -  Tired, decreased energy 3 - - - - - -  Change in appetite 1 - - - - - -  Feeling bad or failure about yourself  2 - - - - - -  Trouble concentrating 2 - - - - - -  Moving slowly or fidgety/restless 1 - - - - - -  Suicidal thoughts 0 - - - - - -  PHQ-9 Score 15 - - - - - -  Difficult doing work/chores Extremely dIfficult - - - - - -    Review of Systems  Constitutional: Negative.   HENT: Negative.   Eyes: Negative.   Respiratory: Negative.   Cardiovascular: Negative.   Gastrointestinal: Negative.   Endocrine: Negative.   Genitourinary: Negative.   Musculoskeletal: Negative.   Skin: Negative.   Allergic/Immunologic: Negative.   Neurological: Negative.   Hematological: Negative.   Psychiatric/Behavioral: Negative.   All other systems reviewed and are negative.      Objective:   Physical Exam  General: Alert and oriented x 3, No apparent distress HEENT:Head is normocephalic, atraumatic, PERRLA, EOMI, sclera anicteric, oral mucosa pink and moist, dentition intact, ext ear canals clear,  Neck:Supple without JVD or lymphadenopathy Heart:RRR Chest:CTA B Abdomen:Soft, non-tender, non-distended, bowel sounds positive. Extremities:No clubbing, cyanosis, or edema. Pulses are 2+ Skin:Clean and intact without signs of breakdown. CABG incision along sternum clean/intact Neuro:Pt is cognitively appropriate with normal insight,  memory, and awareness. Cranial nerves 2-12 are intact. Decreased sensation to LT in both feet, does seem to sense pain.Reflexes are 2+ in all 4's. Fine motor coordination is intact. No tremors. Motor function is grossly 5/5.  Musculoskeletal:Right rib tenderness decreased at 6 and 7.  some tenderness at 8-9. LB tenderat the L3-4 and L4-5 levels with palpation. Pain worst with movement including flexion and extension. SLR was negative. FABER wsa negative. Extension caused some pain in his right leg. Marland Kitchen He has a dextroscoliosis with apex around the L3 level.   Psych:Pt's affect is appropriate. Pt is cooperative and pleasant  1. Chronic right chest wall pain, intercostal neuritis, ribs 6 and 7. Likely triggered by recent CABG in March. Pain is recurrent and pt also having tenderness on the left at 8-9.  2. Chronic low back pain, thoracic levoscoliosis and lumbar dextroscoliosis. Equivocal results with MBB's. What stands outon most recent MRI is critical DDD at L3-L5 and foraminal stenosis at these levels.  3. Diabetic polyneuropathy with ?autonomic dysfunction, hypotension   Plan: 1. Will make a referral to Dr. Letta Pate for an L4-5 ESI translaminar paracentral to the right.  He is having some referred pain to the right thigh but even though there is critical foraminal stenosis at this level, his referral pattern is not consistent with L4. Also will discuss other options with Dr. Letta Pate moving forward  2. Gabapentin 100mg  tid per primary. 3. Voltaren gel and lidocaine gel to back/ribs. Utilize ice. May try some other topical remedies such as topricin cream 4. Continue with good posture and facet home exercises. 5. Trial of robaxin for muscle spasms. 6. Given his lack of benefit with the MBB's I will pursue an MRI of his lumbar spine to assess his facets and discs in more detail.  7. Follow up with me after ESI 69minutes of face to face patient care time were spent during  this visit. All questions were encouraged and answered.

## 2016-10-31 DIAGNOSIS — M9902 Segmental and somatic dysfunction of thoracic region: Secondary | ICD-10-CM | POA: Diagnosis not present

## 2016-10-31 DIAGNOSIS — M5134 Other intervertebral disc degeneration, thoracic region: Secondary | ICD-10-CM | POA: Diagnosis not present

## 2016-11-10 ENCOUNTER — Encounter: Payer: Self-pay | Admitting: Physical Medicine & Rehabilitation

## 2016-11-10 ENCOUNTER — Encounter: Payer: Medicare Other | Attending: Physical Medicine & Rehabilitation

## 2016-11-10 ENCOUNTER — Ambulatory Visit (HOSPITAL_BASED_OUTPATIENT_CLINIC_OR_DEPARTMENT_OTHER): Payer: Medicare Other | Admitting: Physical Medicine & Rehabilitation

## 2016-11-10 VITALS — BP 179/101 | HR 82 | Resp 14

## 2016-11-10 DIAGNOSIS — E785 Hyperlipidemia, unspecified: Secondary | ICD-10-CM | POA: Insufficient documentation

## 2016-11-10 DIAGNOSIS — I2510 Atherosclerotic heart disease of native coronary artery without angina pectoris: Secondary | ICD-10-CM | POA: Diagnosis not present

## 2016-11-10 DIAGNOSIS — K589 Irritable bowel syndrome without diarrhea: Secondary | ICD-10-CM | POA: Insufficient documentation

## 2016-11-10 DIAGNOSIS — R0789 Other chest pain: Secondary | ICD-10-CM | POA: Insufficient documentation

## 2016-11-10 DIAGNOSIS — M545 Low back pain: Secondary | ICD-10-CM | POA: Diagnosis not present

## 2016-11-10 DIAGNOSIS — G8929 Other chronic pain: Secondary | ICD-10-CM | POA: Diagnosis not present

## 2016-11-10 DIAGNOSIS — M5416 Radiculopathy, lumbar region: Secondary | ICD-10-CM | POA: Diagnosis not present

## 2016-11-10 DIAGNOSIS — E1142 Type 2 diabetes mellitus with diabetic polyneuropathy: Secondary | ICD-10-CM | POA: Insufficient documentation

## 2016-11-10 DIAGNOSIS — Z951 Presence of aortocoronary bypass graft: Secondary | ICD-10-CM | POA: Insufficient documentation

## 2016-11-10 DIAGNOSIS — I10 Essential (primary) hypertension: Secondary | ICD-10-CM | POA: Diagnosis not present

## 2016-11-10 DIAGNOSIS — Z8249 Family history of ischemic heart disease and other diseases of the circulatory system: Secondary | ICD-10-CM | POA: Insufficient documentation

## 2016-11-10 DIAGNOSIS — M4185 Other forms of scoliosis, thoracolumbar region: Secondary | ICD-10-CM | POA: Insufficient documentation

## 2016-11-10 DIAGNOSIS — Z955 Presence of coronary angioplasty implant and graft: Secondary | ICD-10-CM | POA: Insufficient documentation

## 2016-11-10 DIAGNOSIS — G588 Other specified mononeuropathies: Secondary | ICD-10-CM | POA: Diagnosis not present

## 2016-11-10 DIAGNOSIS — Z87891 Personal history of nicotine dependence: Secondary | ICD-10-CM | POA: Insufficient documentation

## 2016-11-10 DIAGNOSIS — Z79899 Other long term (current) drug therapy: Secondary | ICD-10-CM | POA: Diagnosis not present

## 2016-11-10 DIAGNOSIS — K219 Gastro-esophageal reflux disease without esophagitis: Secondary | ICD-10-CM | POA: Insufficient documentation

## 2016-11-10 DIAGNOSIS — E559 Vitamin D deficiency, unspecified: Secondary | ICD-10-CM | POA: Diagnosis not present

## 2016-11-10 NOTE — Progress Notes (Signed)
Lumbar Right L4-5  epidural steroid injection under fluoroscopic guidance  Indication: Lumbosacral radiculitis is not relieved by medication management or other conservative care and interfering with self-care and mobility.  No  anticoagulant use.  Informed consent was obtained after describing risk and benefits of the procedure with the patient, this includes bleeding, bruising, infection, paralysis and medication side effects.  The patient wishes to proceed and has given written consent.  Patient was placed in a prone position.  The lumbar area was marked and prepped with Betadine.  It was entered with a 25-gauge 1-1/2 inch needle and one mL of 1% lidocaine was injected into the skin and subcutaneous tissue.  Then a 17-gauge spinal needle was inserted under fluoroscopic guidance into the L4-5 interlaminar  space under AP and Lateral imaging.  Once needle tip of approximated the posterior elements, a loss of resistance technique was utilized with lateral imaging.  A positive loss of resistance was obtained and then confirmed by injecting 2 mL's of Omnipaque 180.  Then a solution containing 1.5 mL's of 6mg/ml Celestone and 1.5 mL's of 1% lidocaine was injected.  The patient tolerated procedure well.  Post procedure instructions were given.  Please see post procedure form.  

## 2016-11-10 NOTE — Patient Instructions (Signed)

## 2016-11-10 NOTE — Progress Notes (Signed)
  PROCEDURE RECORD [facility]   Name: [name] DOB:1933-12-29 MRN: [id number]  Date:11/10/2016  Physician: Alysia Penna, MD    Nurse/CMA: Vannessa Godown, CMA  Allergies:  Allergies  Allergen Reactions  . Cymbalta [Duloxetine Hcl]     Dizziness  . Keflex [Cephalexin] Other (See Comments)    Reaction: unknown   . Simvastatin Other (See Comments)    Reaction: unknown   . Sudafed [Pseudoephedrine]     Dizziness    Consent Signed: Yes.    Is patient diabetic? Yes.    CBG today? 5  Pregnant: No. LMP: No LMP for male patient. (age 4-55)  Anticoagulants: no Anti-inflammatory: no Antibiotics: no  Procedure: translaminar epidural steroid injection Position: Prone Start Time:   End Time:   Fluoro Time:   RN/CMA Lorrie Strauch, CMA Juna Caban, CMA    Time 12:15am 1:00pm    BP 179/101 199/101    Pulse 82 90    Respirations 14 14    O2 Sat 95 95    S/S 6 6    3? 3/10 0/10     D/C home with wife, [name] & O X 3, D/C instructions reviewed, and sits independently.

## 2016-11-29 ENCOUNTER — Ambulatory Visit (INDEPENDENT_AMBULATORY_CARE_PROVIDER_SITE_OTHER): Payer: Medicare Other | Admitting: Internal Medicine

## 2016-11-29 ENCOUNTER — Encounter: Payer: Self-pay | Admitting: Internal Medicine

## 2016-11-29 VITALS — BP 138/84 | HR 82 | Temp 97.5°F | Resp 16 | Ht 71.5 in | Wt 200.0 lb

## 2016-11-29 DIAGNOSIS — G903 Multi-system degeneration of the autonomic nervous system: Secondary | ICD-10-CM | POA: Diagnosis not present

## 2016-11-29 DIAGNOSIS — E782 Mixed hyperlipidemia: Secondary | ICD-10-CM | POA: Diagnosis not present

## 2016-11-29 DIAGNOSIS — G44219 Episodic tension-type headache, not intractable: Secondary | ICD-10-CM

## 2016-11-29 DIAGNOSIS — I25110 Atherosclerotic heart disease of native coronary artery with unstable angina pectoris: Secondary | ICD-10-CM

## 2016-11-29 DIAGNOSIS — Z79899 Other long term (current) drug therapy: Secondary | ICD-10-CM

## 2016-11-29 DIAGNOSIS — Z0001 Encounter for general adult medical examination with abnormal findings: Secondary | ICD-10-CM

## 2016-11-29 DIAGNOSIS — I951 Orthostatic hypotension: Secondary | ICD-10-CM

## 2016-11-29 DIAGNOSIS — E559 Vitamin D deficiency, unspecified: Secondary | ICD-10-CM

## 2016-11-29 DIAGNOSIS — Z125 Encounter for screening for malignant neoplasm of prostate: Secondary | ICD-10-CM | POA: Diagnosis not present

## 2016-11-29 DIAGNOSIS — D519 Vitamin B12 deficiency anemia, unspecified: Secondary | ICD-10-CM | POA: Diagnosis not present

## 2016-11-29 DIAGNOSIS — Z951 Presence of aortocoronary bypass graft: Secondary | ICD-10-CM

## 2016-11-29 DIAGNOSIS — Z1212 Encounter for screening for malignant neoplasm of rectum: Secondary | ICD-10-CM

## 2016-11-29 DIAGNOSIS — I10 Essential (primary) hypertension: Secondary | ICD-10-CM | POA: Diagnosis not present

## 2016-11-29 DIAGNOSIS — E119 Type 2 diabetes mellitus without complications: Secondary | ICD-10-CM | POA: Diagnosis not present

## 2016-11-29 DIAGNOSIS — Z136 Encounter for screening for cardiovascular disorders: Secondary | ICD-10-CM

## 2016-11-29 DIAGNOSIS — D485 Neoplasm of uncertain behavior of skin: Secondary | ICD-10-CM

## 2016-11-29 DIAGNOSIS — K219 Gastro-esophageal reflux disease without esophagitis: Secondary | ICD-10-CM

## 2016-11-29 DIAGNOSIS — G588 Other specified mononeuropathies: Secondary | ICD-10-CM

## 2016-11-29 DIAGNOSIS — Z0000 Encounter for general adult medical examination without abnormal findings: Secondary | ICD-10-CM | POA: Diagnosis not present

## 2016-11-29 DIAGNOSIS — Z1211 Encounter for screening for malignant neoplasm of colon: Secondary | ICD-10-CM | POA: Diagnosis not present

## 2016-11-29 LAB — CBC WITH DIFFERENTIAL/PLATELET
Basophils Absolute: 0 cells/uL (ref 0–200)
Basophils Relative: 0 %
Eosinophils Absolute: 146 cells/uL (ref 15–500)
Eosinophils Relative: 2 %
HCT: 39.6 % (ref 38.5–50.0)
Hemoglobin: 12.6 g/dL — ABNORMAL LOW (ref 13.2–17.1)
Lymphocytes Relative: 31 %
Lymphs Abs: 2263 cells/uL (ref 850–3900)
MCH: 29.3 pg (ref 27.0–33.0)
MCHC: 31.8 g/dL — ABNORMAL LOW (ref 32.0–36.0)
MCV: 92.1 fL (ref 80.0–100.0)
MPV: 9.6 fL (ref 7.5–12.5)
Monocytes Absolute: 511 cells/uL (ref 200–950)
Monocytes Relative: 7 %
Neutro Abs: 4380 cells/uL (ref 1500–7800)
Neutrophils Relative %: 60 %
Platelets: 265 10*3/uL (ref 140–400)
RBC: 4.3 MIL/uL (ref 4.20–5.80)
RDW: 15.2 % — ABNORMAL HIGH (ref 11.0–15.0)
WBC: 7.3 10*3/uL (ref 3.8–10.8)

## 2016-11-29 LAB — TSH: TSH: 2.47 mIU/L (ref 0.40–4.50)

## 2016-11-29 MED ORDER — BUTALBITAL-APAP-CAFFEINE 50-325-40 MG PO TABS: 1.0000 | ORAL_TABLET | Freq: Four times a day (QID) | ORAL | 0 refills | Status: AC | PRN

## 2016-11-29 NOTE — Progress Notes (Addendum)
[facility]   [name], M.D.      Uvaldo Bristle. Silverio Lay, P.A.-C [facility]                [address] [place], [place] [address] Telephone [phone] Telefax [phone] Annual  Screening/Preventative Visit  & Comprehensive Evaluation & Examination     This very nice 83 y.o. MWM presents for a Screening/Preventative Visit & comprehensive evaluation and management of multiple medical co-morbidities.  Patient has been followed for HTN, T2_NIDDM  Prediabetes, Hyperlipidemia and Vitamin D Deficiency. Patient also has concerns re: a scaly area of his vertex scalp which keeps re-crusting and never seems to heal.     Patient has longstanding hx/o Labile HTN (1995) which seems to have evolved into Dysautonomia and postural hypotension with supine HTN. Patient's BP has been controlled at home and titrated by his wife adjusting his dose of Florinef.  Today's BP is at goal - 138/84. Patient has ASCAD s/p  PTCA X 2 in 2005/2011 and then CABG in Mar 2017.  Patient denies any cardiac symptoms as chest pain, palpitations, shortness of breath, dizziness or ankle swelling.     Patient's hyperlipidemia is nor controlled with diet and medications. Patient denies myalgias or other medication SE's. Last lipids were not at goal which he attributed to indiscrete diet: Lab Results  Component Value Date   CHOL 176 08/15/2016   HDL 32 (L) 08/15/2016   LDLCALC 112 (H) 08/15/2016   TRIG 161 (H) 08/15/2016   CHOLHDL 5.5 (H) 08/15/2016      Patient has PreDiabetes ("48" on treatment in 2008)  since    and patient denies reactive hypoglycemic symptoms, visual blurring, diabetic polys or paresthesias. Last A1c was  Lab Results  Component Value Date   HGBA1C 5.8 (H) 08/15/2016       Finally, patient has history of Vitamin D Deficiency of    and last vitamin D was  Lab Results  Component Value Date   VD25OH 67 05/17/2016    Current Outpatient Prescriptions on File Prior to Visit  Medication Sig  . aspirin EC 81 MG EC tablet Take 1 tablet (81 mg total) by mouth daily.  Marland Kitchen CALCIUM PO Take 1 tablet by mouth daily.  . Cholecalciferol (VITAMIN D PO) Take 5,000 Units by mouth 2 (two) times daily.  . cyanocobalamin 1000 MCG tablet Take 1,000 mcg by mouth daily.  . diclofenac sodium (VOLTAREN) 1 % GEL Apply 1 application topically 3 (three) times daily. Ribs, back.  . Flaxseed, Linseed, 1000 MG CAPS Take 1,300 mg by mouth daily.   . fludrocortisone (FLORINEF) 0.1 MG tablet TAKE 2 TABLETS(200 MCG) BY MOUTH DAILY  . Magnesium 250 MG TABS Take 500 mg by mouth daily.   . methocarbamol (ROBAXIN) 500 MG tablet Take 1 tablet (500 mg total) by mouth every 6 (six) hours as needed for muscle spasms. (Patient taking differently: Take 250 mg by mouth daily. )  . multivitamin (THERAGRAN) per tablet Take 1 tablet by mouth daily.    Marland Kitchen omeprazole (PRILOSEC) 20 MG capsule TAKE 2 CAPSULES BY MOUTH EVERY DAY FOR INDIGESTION   No current facility-administered medications on file prior to visit.    Allergies  Allergen Reactions  . Cymbalta [Duloxetine Hcl]     Dizziness  . Keflex [Cephalexin] Other (See Comments)    Reaction: unknown   . Simvastatin  Other (See Comments)    Reaction: unknown   . Sudafed [Pseudoephedrine]     Dizziness   Past Medical History:  Diagnosis Date  . 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 Colon polyps   . Coronary atherosclerosis of unspecified type of vessel, native or graft   . Difficult intubation   . Esophageal reflux   . Hypertension   . IBS (irritable bowel syndrome)   . Orthostatic hypotension    "BP has been dropping alot when I stand up for the last month or so" (02/17/2016)  . Vitamin B 12 deficiency   . Vitamin D deficiency    Health Maintenance  Topic Date Due  . FOOT EXAM  02/15/2015  . URINE MICROALBUMIN  10/28/2016  . INFLUENZA VACCINE  11/30/2016  . HEMOGLOBIN A1C  02/14/2017   . OPHTHALMOLOGY EXAM  07/12/2017  . TETANUS/TDAP  10/02/2022  . PNA vac Low Risk Adult  Completed   Immunization History  Administered Date(s) Administered  . Influenza, High Dose Seasonal PF 12/30/2013, 02/02/2015, 01/27/2016  . Influenza-Unspecified 01/13/2013  . Pneumococcal Conjugate-13 02/14/2014  . Pneumococcal-Unspecified 06/15/2004  . Tetanus 10/01/2012  . Zoster 10/01/2012   Past Surgical History:  Procedure Laterality Date  . BLADDER SURGERY  1969   traumatic pelvic fractures, urethral and bladder repair  . CARDIAC CATHETERIZATION N/A 07/01/2015   Procedure: Left Heart Cath and Coronary Angiography;  Surgeon: Wellington Hampshire, MD;  Location: [facility];  Service: Cardiovascular;  Laterality: N/A;  . CORONARY ANGIOPLASTY WITH STENT PLACEMENT    . CORONARY ARTERY BYPASS GRAFT N/A 07/06/2015   Procedure: CORONARY ARTERY BYPASS GRAFTING (CABG)x 4   utilizing the left internal mammary artery and endoscopically harvested bilateral  sapheneous vein.;  Surgeon: Ivin Poot, MD;  Location: [facility];  Service: Open Heart Surgery;  Laterality: N/A;  . KNEE SURGERY    . TEE WITHOUT CARDIOVERSION N/A 07/06/2015   Procedure: TRANSESOPHAGEAL ECHOCARDIOGRAM (TEE);  Surgeon: Ivin Poot, MD;  Location: [facility];  Service: Open Heart Surgery;  Laterality: N/A;   Family History  Problem Relation Age of Onset  . Heart attack Father        died age 17  . Heart attack Brother        died age 63  . Heart attack Sister        died age 85  . Colon cancer Sister   . Liver cancer Sister   . Diabetes Maternal Grandmother   . Colon polyps Sister        and brothers x 2    Social History   Social History  . Marital status: Married    Spouse name: N/A  . Number of children: 4   . Years of education: N/A   Occupational History  . Retired    Social History Main Topics  . Smoking status: Former Smoker    Quit date: 07/16/1963  . Smokeless tobacco: Never Used  . Alcohol use No  .  Drug use: No  . Sexual activity: Not Currently   Other Topics Concern  . Not on file   Social History Narrative   1 caffeine drink daily     ROS Constitutional: Denies fever, chills, weight loss/gain, headaches, insomnia,  night sweats or change in appetite. Does c/o fatigue. Eyes: Denies redness, blurred vision, diplopia, discharge, itchy or watery eyes.  ENT: Denies discharge, congestion, post nasal drip, epistaxis, sore throat, earache, hearing loss, dental pain, Tinnitus, Vertigo, Sinus pain  or snoring.  Cardio: Denies chest pain, palpitations, irregular heartbeat, syncope, dyspnea, diaphoresis, orthopnea, PND, claudication or edema Respiratory: denies cough, dyspnea, DOE, pleurisy, hoarseness, laryngitis or wheezing.  Gastrointestinal: Denies dysphagia, heartburn, reflux, water brash, pain, cramps, nausea, vomiting, bloating, diarrhea, constipation, hematemesis, melena, hematochezia, jaundice or hemorrhoids Genitourinary: Denies dysuria, frequency, urgency, nocturia, hesitancy, discharge, hematuria or flank pain Musculoskeletal: Denies arthralgia, myalgia, stiffness, Jt. Swelling, pain, limp or strain/sprain. Denies Falls. Skin: Denies puritis, rash, hives, warts, acne, eczema or change in skin lesion Neuro: No weakness, tremor, incoordination, spasms, paresthesia or pain Psychiatric: Denies confusion, memory loss or sensory loss. Denies Depression. Endocrine: Denies change in weight, skin, hair change, nocturia, and paresthesia, diabetic polys, visual blurring or hyper / hypo glycemic episodes.  Heme/Lymph: No excessive bleeding, bruising or enlarged lymph nodes.  Physical Exam  BP 138/84   Pulse 82   Temp (!) 97.5 F (36.4 C)   Resp 16   Ht 5' 11.5" (1.816 m)   Wt 200 lb (90.7 kg)   BMI 27.51 kg/m   General Appearance: Well nourished and well groomed and in no apparent distress.  Eyes: PERRLA, EOMs, conjunctiva no swelling or erythema, normal fundi and vessels. Sinuses:  No frontal/maxillary tenderness ENT/Mouth: EACs patent / TMs  nl. Nares clear without erythema, swelling, mucoid exudates. Oral hygiene is good. No erythema, swelling, or exudate. Tongue normal, non-obstructing. Tonsils not swollen or erythematous. Hearing normal.  Neck: Supple, thyroid normal. No bruits, nodes or JVD. Respiratory: Respiratory effort normal.  BS equal and clear bilateral without rales, rhonci, wheezing or stridor. Cardio: Heart sounds are normal with regular rate and rhythm and no murmurs, rubs or gallops. Peripheral pulses are normal and equal bilaterally without edema. No aortic or femoral bruits. Chest: symmetric with normal excursions and percussion.  Abdomen: Soft, with Nl bowel sounds. Nontender, no guarding, rebound, hernias, masses, or organomegaly.  Lymphatics: Non tender without lymphadenopathy.  Genitourinary: No hernias.Testes nl. DRE - prostate nl for age - smooth & firm w/o nodules. Musculoskeletal: Full ROM all peripheral extremities, joint stability, 5/5 strength, and normal gait. Skin: Warm and dry without rashes, lesions, cyanosis, clubbing or  ecchymosis. There is a 12 x 14 mm superficially ulcerated skin area over the vertex scalp. After informed consent the area was treated with liq N2 by a triple freeze/thaw technique (Procedure - CPT: 17000 ) . Patient also has a 10 x 9 mm scaly area over the tip of his nose.   Neuro: Cranial nerves intact, reflexes equal bilaterally. Normal muscle tone, no cerebellar symptoms. Sensation intact.  Pysch: Alert and oriented X 3 with normal affect, insight and judgment appropriate.   Assessment and Plan  1. Annual Preventative/Screening Exam   2. Supine hypertension  - EKG 12-Lead - Urinalysis, Routine w reflex microscopic - Microalbumin / creatinine urine ratio - CBC with Differential/Platelet - BASIC METABOLIC PANEL WITH GFR - Magnesium - TSH  3. Hyperlipidemia, mixed  - EKG 12-Lead - Hepatic function panel -  Lipid panel - TSH  4. Diabetes mellitus type 2, diet-controlled (HCC)  - EKG 12-Lead - HM DIABETES FOOT EXAM - LOW EXTREMITY NEUR EXAM DOCUM - Hemoglobin A1c - Insulin, random  5. Vitamin D deficiency  - VITAMIN D 25 Hydroxy  6. Dysautonomia orthostatic hypotension syndrome (HCC)  - EKG [id number] - BASIC METABOLIC PANEL WITH GFR  7. Atherosclerosis of native coronary artery of native heart with unstable angina pectoris ([facility])  - EKG 12-Lead - Lipid panel  8. S/P CABG x 4  -  EKG 12-Lead - Lipid panel  9. 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 Anemia due to vitamin B12 deficiency  - Vitamin B12  12. Encounter for colorectal cancer screening  - POC Hemoccult Bld/Stl   13. Prostate cancer screening  - PSA  14. Screening for ischemic heart disease  - EKG 12-Lead  15. Medication management  - Urinalysis, Routine w reflex microscopic - Microalbumin / creatinine urine ratio - BASIC METABOLIC PANEL WITH GFR - Hepatic function panel - Magnesium - Lipid panel - TSH - Hemoglobin A1c - Insulin, random - VITAMIN D 25 Hydroxy   16. Neoplasm of Skin, UKN Significance.   - Scalp lseion treated by Cryosurg   - Lesion - nasal tip - advised to use his Rx Efudex bid for 2 weeks to se if skin reaction occurs  17. Episodic tension-type headache, not intractable   - butalbital-acetaminophen-caffeine (FIORICET, ESGIC) 50-325-40 MG tablet; Take 1-2 tablets by mouth every 6 (six) hours as needed for headache.  Dispense: 60 tablet; Refill: 0        Patient was counseled in prudent diet, weight control to achieve/maintain BMI less than 25, BP monitoring, regular exercise and medications as discussed.  Discussed med effects and SE's. Routine screening labs and tests as requested with regular follow-up as recommended. Over 40 minutes of exam, counseling, chart review and high complex critical decision making was performed

## 2016-11-29 NOTE — Patient Instructions (Signed)

## 2016-11-30 LAB — URINALYSIS, ROUTINE W REFLEX MICROSCOPIC
Bilirubin Urine: NEGATIVE
Glucose, UA: NEGATIVE
Hgb urine dipstick: NEGATIVE
Ketones, ur: NEGATIVE
Leukocytes, UA: NEGATIVE
Nitrite: NEGATIVE
Protein, ur: NEGATIVE
Specific Gravity, Urine: 1.013 (ref 1.001–1.035)
pH: 7.5 (ref 5.0–8.0)

## 2016-11-30 LAB — MICROALBUMIN / CREATININE URINE RATIO
Creatinine, Urine: 99 mg/dL (ref 20–370)
Microalb Creat Ratio: 8 mcg/mg creat (ref <30)
Microalb, Ur: 0.8 mg/dL

## 2016-11-30 LAB — BASIC METABOLIC PANEL WITH GFR
BUN: 12 mg/dL (ref 7–25)
CO2: 29 mmol/L (ref 20–31)
Calcium: 9 mg/dL (ref 8.6–10.3)
Chloride: 103 mmol/L (ref 98–110)
Creat: 1.25 mg/dL — ABNORMAL HIGH (ref 0.70–1.11)
GFR, Est African American: 62 mL/min (ref >=60)
GFR, Est Non African American: 54 mL/min — ABNORMAL LOW (ref >=60)
Glucose, Bld: 99 mg/dL (ref 65–99)
Potassium: 3.8 mmol/L (ref 3.5–5.3)
Sodium: 143 mmol/L (ref 135–146)

## 2016-11-30 LAB — LIPID PANEL
Cholesterol: 168 mg/dL (ref <200)
HDL: 35 mg/dL — ABNORMAL LOW (ref >40)
LDL Cholesterol: 110 mg/dL — ABNORMAL HIGH (ref <100)
Total CHOL/HDL Ratio: 4.8 Ratio (ref <5.0)
Triglycerides: 113 mg/dL (ref <150)
VLDL: 23 mg/dL (ref <30)

## 2016-11-30 LAB — MAGNESIUM: Magnesium: 2.3 mg/dL (ref 1.5–2.5)

## 2016-11-30 LAB — HEPATIC FUNCTION PANEL
ALT: 10 U/L (ref 9–46)
AST: 20 U/L (ref 10–35)
Albumin: 3.9 g/dL (ref 3.6–5.1)
Alkaline Phosphatase: 84 U/L (ref 40–115)
Bilirubin, Direct: 0.1 mg/dL (ref <=0.2)
Indirect Bilirubin: 0.4 mg/dL (ref 0.2–1.2)
Total Bilirubin: 0.5 mg/dL (ref 0.2–1.2)
Total Protein: 6.5 g/dL (ref 6.1–8.1)

## 2016-11-30 LAB — HEMOGLOBIN A1C
Hgb A1c MFr Bld: 6.3 % — ABNORMAL HIGH (ref <5.7)
Mean Plasma Glucose: 134 mg/dL

## 2016-11-30 LAB — PSA: PSA: 1 ng/mL (ref <=4.0)

## 2016-11-30 LAB — VITAMIN D 25 HYDROXY (VIT D DEFICIENCY, FRACTURES): Vit D, 25-Hydroxy: 117 ng/mL — ABNORMAL HIGH (ref 30–100)

## 2016-11-30 LAB — VITAMIN B12: Vitamin B-12: 1230 pg/mL — ABNORMAL HIGH (ref 200–1100)

## 2016-12-01 LAB — INSULIN, RANDOM: Insulin: 6.7 u[IU]/mL (ref 2.0–19.6)

## 2016-12-05 ENCOUNTER — Other Ambulatory Visit: Payer: Self-pay | Admitting: Internal Medicine

## 2016-12-05 MED ORDER — PREDNISONE 20 MG PO TABS: ORAL_TABLET | ORAL | 0 refills | Status: AC

## 2016-12-14 ENCOUNTER — Ambulatory Visit
Admission: RE | Admit: 2016-12-14 | Discharge: 2016-12-14 | Disposition: A | Discharge status: Home / Self Care | Payer: Medicare Other | Source: Ambulatory Visit | Attending: Physical Medicine & Rehabilitation | Admitting: Physical Medicine & Rehabilitation

## 2016-12-14 ENCOUNTER — Encounter: Payer: Self-pay | Admitting: Physical Medicine & Rehabilitation

## 2016-12-14 ENCOUNTER — Telehealth: Payer: Self-pay | Admitting: Physical Medicine & Rehabilitation

## 2016-12-14 ENCOUNTER — Encounter: Payer: Medicare Other | Attending: Physical Medicine & Rehabilitation | Admitting: Physical Medicine & Rehabilitation

## 2016-12-14 DIAGNOSIS — I10 Essential (primary) hypertension: Secondary | ICD-10-CM | POA: Diagnosis not present

## 2016-12-14 DIAGNOSIS — M1711 Unilateral primary osteoarthritis, right knee: Secondary | ICD-10-CM | POA: Diagnosis not present

## 2016-12-14 DIAGNOSIS — Z955 Presence of coronary angioplasty implant and graft: Secondary | ICD-10-CM | POA: Diagnosis not present

## 2016-12-14 DIAGNOSIS — M545 Low back pain: Secondary | ICD-10-CM | POA: Insufficient documentation

## 2016-12-14 DIAGNOSIS — M4185 Other forms of scoliosis, thoracolumbar region: Secondary | ICD-10-CM | POA: Diagnosis not present

## 2016-12-14 DIAGNOSIS — I2510 Atherosclerotic heart disease of native coronary artery without angina pectoris: Secondary | ICD-10-CM | POA: Insufficient documentation

## 2016-12-14 DIAGNOSIS — K589 Irritable bowel syndrome without diarrhea: Secondary | ICD-10-CM | POA: Insufficient documentation

## 2016-12-14 DIAGNOSIS — G8929 Other chronic pain: Secondary | ICD-10-CM | POA: Insufficient documentation

## 2016-12-14 DIAGNOSIS — Z8249 Family history of ischemic heart disease and other diseases of the circulatory system: Secondary | ICD-10-CM | POA: Insufficient documentation

## 2016-12-14 DIAGNOSIS — E785 Hyperlipidemia, unspecified: Secondary | ICD-10-CM | POA: Diagnosis not present

## 2016-12-14 DIAGNOSIS — Z87891 Personal history of nicotine dependence: Secondary | ICD-10-CM | POA: Diagnosis not present

## 2016-12-14 DIAGNOSIS — Z79899 Other long term (current) drug therapy: Secondary | ICD-10-CM | POA: Diagnosis not present

## 2016-12-14 DIAGNOSIS — Z951 Presence of aortocoronary bypass graft: Secondary | ICD-10-CM | POA: Diagnosis not present

## 2016-12-14 DIAGNOSIS — E559 Vitamin D deficiency, unspecified: Secondary | ICD-10-CM | POA: Diagnosis not present

## 2016-12-14 DIAGNOSIS — E1142 Type 2 diabetes mellitus with diabetic polyneuropathy: Secondary | ICD-10-CM | POA: Insufficient documentation

## 2016-12-14 DIAGNOSIS — R0789 Other chest pain: Secondary | ICD-10-CM | POA: Insufficient documentation

## 2016-12-14 DIAGNOSIS — K219 Gastro-esophageal reflux disease without esophagitis: Secondary | ICD-10-CM | POA: Insufficient documentation

## 2016-12-14 DIAGNOSIS — M179 Osteoarthritis of knee, unspecified: Secondary | ICD-10-CM | POA: Diagnosis not present

## 2016-12-14 DIAGNOSIS — G588 Other specified mononeuropathies: Secondary | ICD-10-CM | POA: Insufficient documentation

## 2016-12-14 NOTE — Patient Instructions (Signed)
VOLTAREN GEL TO YOUR KNEES  SUPPLEMENTS USEFUL FOR OSTEOARTHRITIS: OMEGA 3 FATTY ACIDS, TURMERIC, GINGER, TART CHERRY EXTRACT, CELERY SEED, GLUCOSAMINE WITH CHONDROITIN

## 2016-12-14 NOTE — Telephone Encounter (Signed)
XR revealed: Moderate arthritis of the right knee . Would continue with the plan as outlined today and we can follow up on it when I see him back in two months. If he has any problems, he should let us know.  thanks

## 2016-12-14 NOTE — Progress Notes (Signed)
Subjective:    Patient ID: [name], male    DOB: 01/03/1934, 83 y.o.   MRN: [id number]  HPI   Mr. Haff is here in follow up of his chronic pain. He had good results with the right L4-5 ESI by Dr. Letta Pate last month. He has to watch what he doesn't "over-do" things but he's been active at home ("on the tiller") and with some other activities. He is doing his stretches as advised. He has started walking with his wife again but has noticed that his right knee is becoming painful about half way through their walk. He has had prior arthroscopic surgery on the knee in the late nineties but nothing since. The knee swells and sometimes crunches with movement. He's not wearing a brace and he's not putting any cream on the wound.   Pain Inventory Average Pain 3 Pain Right Now 3 My pain is aching  In the last 24 hours, has pain interfered with the following? General activity 0 Relation with others 0 Enjoyment of life 0 What TIME of day is your pain at its worst? . Sleep (in general) Fair  Pain is worse with: standing Pain improves with: . Relief from Meds: .  Mobility walk without assistance  Function retired  Neuro/Psych numbness tingling dizziness  Prior Studies Any changes since last visit?  no  Physicians involved in your care Any changes since last visit?  no   Family History  Problem Relation Age of Onset  . Heart attack Father        died age 53  . Heart attack Brother        died age 90  . Heart attack Sister        died age 67  . Colon cancer Sister   . Liver cancer Sister   . Diabetes Maternal Grandmother   . Colon polyps Sister        and brothers x 2    Social History   Social History  . Marital status: Married    Spouse name: N/A  . Number of children: 4   . Years of education: N/A   Occupational History  . Retired    Social History Main Topics  . Smoking status: Former Smoker    Quit date: 07/16/1963  . Smokeless tobacco: Never  Used  . Alcohol use No  . Drug use: No  . Sexual activity: Not Currently   Other Topics Concern  . Not on file   Social History Narrative   1 caffeine drink daily    Past Surgical History:  Procedure Laterality Date  . BLADDER SURGERY  1969   traumatic pelvic fractures, urethral and bladder repair  . CARDIAC CATHETERIZATION N/A 07/01/2015   Procedure: Left Heart Cath and Coronary Angiography;  Surgeon: Wellington Hampshire, MD;  Location: [facility];  Service: Cardiovascular;  Laterality: N/A;  . CORONARY ANGIOPLASTY WITH STENT PLACEMENT    . CORONARY ARTERY BYPASS GRAFT N/A 07/06/2015   Procedure: CORONARY ARTERY BYPASS GRAFTING (CABG)x 4   utilizing the left internal mammary artery and endoscopically harvested bilateral  sapheneous vein.;  Surgeon: Ivin Poot, MD;  Location: [facility];  Service: Open Heart Surgery;  Laterality: N/A;  . KNEE SURGERY    . TEE WITHOUT CARDIOVERSION N/A 07/06/2015   Procedure: TRANSESOPHAGEAL ECHOCARDIOGRAM (TEE);  Surgeon: Ivin Poot, MD;  Location: [facility];  Service: Open Heart Surgery;  Laterality: N/A;   Past Medical History:  Diagnosis Date  .  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 Colon polyps   . Coronary atherosclerosis of unspecified type of vessel, native or graft   . Difficult intubation   . Esophageal reflux   . Hypertension   . IBS (irritable bowel syndrome)   . Orthostatic hypotension    "BP has been dropping alot when I stand up for the last month or so" (02/17/2016)  . Vitamin B 12 deficiency   . Vitamin D deficiency    There were no vitals taken for this visit.  Opioid Risk Score:   Fall Risk Score:  `1  Depression screen PHQ 2/9  Depression screen [facility] 2/9 11/29/2016 03/22/2016 02/09/2016 10/29/2015 09/23/2015 08/11/2015 05/14/2015  Decreased Interest 0 3 0 0 0 0 0  Down, Depressed, Hopeless 0 1 0 0 0 0 0  PHQ - 2 Score 0 4 0 0 0 0 0  Altered sleeping - 2 - - - - -  Tired, decreased energy - 3 - - - - -  Change in appetite -  1 - - - - -  Feeling bad or failure about yourself  - 2 - - - - -  Trouble concentrating - 2 - - - - -  Moving slowly or fidgety/restless - 1 - - - - -  Suicidal thoughts - 0 - - - - -  PHQ-9 Score - 15 - - - - -  Difficult doing work/chores - Extremely dIfficult - - - - -     Review of Systems  Constitutional: Negative.   HENT: Negative.   Eyes: Negative.   Respiratory: Negative.   Cardiovascular: Negative.   Gastrointestinal: Negative.   Endocrine: Negative.   Genitourinary: Negative.   Musculoskeletal: Negative.   Skin: Negative.   Allergic/Immunologic: Negative.   Neurological: Negative.   Hematological: Negative.   Psychiatric/Behavioral: Negative.   All other systems reviewed and are negative.      Objective:   Physical Exam General: Alert and oriented x 3, No apparent distress HEENT:Head is normocephalic, atraumatic, PERRLA, EOMI, sclera anicteric, oral mucosa pink and moist, dentition intact, ext ear canals clear,  Neck:Supple without JVD or lymphadenopathy Heart:RRR Chest:CTA BI Abdomen:Soft, non-tender, non-distended, bowel sounds positive. Extremities:No clubbing, cyanosis, or edema. Pulses are 2+ Skin:Clean and intact without signs of breakdown. CABG incision along sternum clean/intact Neuro:Pt is cognitively appropriate with normal insight, memory, and awareness. Cranial nerves 2-12 are intact. Decreased sensation to LT in both feet, does seem to sense pain.Reflexes are 2+ in all 4's. Fine motor coordination is intact. No tremors. Motor function is grossly 5/5.  Musculoskeletal:decreased right rib tenderness. Low back ROM has improved.  SLR was negative. FABER wsa negative. Extension caused some pain in his right leg at knee. He has right lateral joint line pain at the knee. +crepitus right knee, Meniscal maneuver equivocal. . He has a dextroscoliosis with apex around the L3 level.   Psych:Pt's affect is appropriate. Pt is cooperative and  pleasant        1. Chronic right chest wall pain, intercostal neuritis, ribs 6 and 7. Likely triggered by recent CABG in March. Pain is recurrent and pt also having tenderness on the left at 8-9---improved.  2. Chronic low back pain, thoracic levoscoliosis and lumbar dextroscoliosis. Equivocal results with MBB's. critical DDD at L3-L5 and foraminal stenosis at these levels. Good results with recent right L4-5 T-LESI.  3. Diabetic polyneuropathy with ?autonomic dysfunction, hypotension 4. Right knee pain. Likely old meniscal injury/osteoarthritis   Plan: 1. Continue  with HEP for back. Encouraged activity "within reason".  2. Gabapentin 100mg  tid per primary. 3. Voltaren gel and lidocaine gel to back/ribs.  4. Ordered xray right knee. Use voltaren gel to knee. Gave a list of supplements he can try as well. Injection could be an option if needed. Extensive knee exercises were provided.  5. Trial of robaxin for muscle spasms. 6.  Follow up with me in about 2 months. 62minutes of face to face patient care time were spent during this visit. All questions were encouraged and answered.              Assessment & Plan:

## 2016-12-15 NOTE — Telephone Encounter (Signed)
Contacted patieny, left a detailed message per patient's DPR agreement. Informed pt of Dr. Charm Barges findings

## 2016-12-18 ENCOUNTER — Other Ambulatory Visit: Payer: Self-pay | Admitting: Physical Medicine & Rehabilitation

## 2016-12-18 DIAGNOSIS — M4106 Infantile idiopathic scoliosis, lumbar region: Secondary | ICD-10-CM

## 2016-12-18 DIAGNOSIS — G588 Other specified mononeuropathies: Secondary | ICD-10-CM

## 2016-12-19 NOTE — Telephone Encounter (Signed)
Recieved electronic medication refill request for methocarbamol, last note mentioned that this medication is on trial for the patient, is it ok to refill this medication?   Please advise

## 2016-12-21 ENCOUNTER — Telehealth: Payer: Self-pay

## 2016-12-21 ENCOUNTER — Other Ambulatory Visit: Payer: Self-pay

## 2016-12-21 DIAGNOSIS — Z1212 Encounter for screening for malignant neoplasm of rectum: Principal | ICD-10-CM

## 2016-12-21 DIAGNOSIS — Z1211 Encounter for screening for malignant neoplasm of colon: Secondary | ICD-10-CM

## 2016-12-21 LAB — POC HEMOCCULT BLD/STL (HOME/3-CARD/SCREEN)
Card #2 Fecal Occult Blod, POC: NEGATIVE
Card #3 Fecal Occult Blood, POC: NEGATIVE
Fecal Occult Blood, POC: NEGATIVE

## 2016-12-21 NOTE — Telephone Encounter (Signed)
Attempted to perform prior authorization starting 12/20/2016 for methocarbamol, request came back as denied due to medication not being on formulary, states that he needs trials and failures of at least 5 of the following medications.  Anaprox Ds celebrex Diclofenac potassium Diflunisal Etodolac  Motrin Nalfon Ponstel zanaflex     Please advise on next step.

## 2016-12-22 ENCOUNTER — Other Ambulatory Visit: Payer: Self-pay | Admitting: Physical Medicine & Rehabilitation

## 2016-12-22 MED ORDER — TIZANIDINE HCL 2 MG PO TABS
2.0000 mg | ORAL_TABLET | Freq: Three times a day (TID) | ORAL | 0 refills | Status: DC | PRN
Start: 2016-12-22 — End: 2016-12-22

## 2016-12-22 NOTE — Telephone Encounter (Signed)
Those are all NSAIDS except for zanaflex!!  He has reflux and is on ASA daily!!! Those aren't options. We can try lose dose zanaflex if he tolerates 2mg  q8 prn #40

## 2016-12-22 NOTE — Telephone Encounter (Signed)
Medication ordered, patient notified 

## 2017-01-19 ENCOUNTER — Emergency Department (HOSPITAL_COMMUNITY): Payer: Medicare Other

## 2017-01-19 ENCOUNTER — Encounter (HOSPITAL_COMMUNITY): Payer: Self-pay | Admitting: Emergency Medicine

## 2017-01-19 ENCOUNTER — Emergency Department (HOSPITAL_COMMUNITY)
Admission: EM | Admit: 2017-01-19 | Discharge: 2017-01-19 | Disposition: A | Discharge status: Home / Self Care | Payer: Medicare Other | Attending: Emergency Medicine | Admitting: Emergency Medicine

## 2017-01-19 ENCOUNTER — Other Ambulatory Visit: Payer: Self-pay

## 2017-01-19 ENCOUNTER — Telehealth: Payer: Self-pay | Admitting: Cardiology

## 2017-01-19 DIAGNOSIS — N183 Chronic kidney disease, stage 3 (moderate): Secondary | ICD-10-CM | POA: Insufficient documentation

## 2017-01-19 DIAGNOSIS — E1122 Type 2 diabetes mellitus with diabetic chronic kidney disease: Secondary | ICD-10-CM | POA: Diagnosis not present

## 2017-01-19 DIAGNOSIS — Z79899 Other long term (current) drug therapy: Secondary | ICD-10-CM | POA: Insufficient documentation

## 2017-01-19 DIAGNOSIS — I129 Hypertensive chronic kidney disease with stage 1 through stage 4 chronic kidney disease, or unspecified chronic kidney disease: Secondary | ICD-10-CM | POA: Insufficient documentation

## 2017-01-19 DIAGNOSIS — Z7982 Long term (current) use of aspirin: Secondary | ICD-10-CM | POA: Diagnosis not present

## 2017-01-19 DIAGNOSIS — R079 Chest pain, unspecified: Secondary | ICD-10-CM | POA: Diagnosis not present

## 2017-01-19 DIAGNOSIS — Z87891 Personal history of nicotine dependence: Secondary | ICD-10-CM | POA: Diagnosis not present

## 2017-01-19 DIAGNOSIS — I2510 Atherosclerotic heart disease of native coronary artery without angina pectoris: Secondary | ICD-10-CM | POA: Insufficient documentation

## 2017-01-19 DIAGNOSIS — R0789 Other chest pain: Secondary | ICD-10-CM | POA: Insufficient documentation

## 2017-01-19 DIAGNOSIS — E782 Mixed hyperlipidemia: Secondary | ICD-10-CM | POA: Diagnosis not present

## 2017-01-19 LAB — CBC
HCT: 39.5 % (ref 39.0–52.0)
Hemoglobin: 12.6 g/dL — ABNORMAL LOW (ref 13.0–17.0)
MCH: 28.8 pg (ref 26.0–34.0)
MCHC: 31.9 g/dL (ref 30.0–36.0)
MCV: 90.4 fL (ref 78.0–100.0)
Platelets: 232 10*3/uL (ref 150–400)
RBC: 4.37 MIL/uL (ref 4.22–5.81)
RDW: 14.8 % (ref 11.5–15.5)
WBC: 8 10*3/uL (ref 4.0–10.5)

## 2017-01-19 LAB — BASIC METABOLIC PANEL
Anion gap: 7 (ref 5–15)
BUN: 14 mg/dL (ref 6–20)
CO2: 26 mmol/L (ref 22–32)
Calcium: 8.8 mg/dL — ABNORMAL LOW (ref 8.9–10.3)
Chloride: 105 mmol/L (ref 101–111)
Creatinine, Ser: 1.26 mg/dL — ABNORMAL HIGH (ref 0.61–1.24)
GFR calc Af Amer: 60 mL/min — ABNORMAL LOW (ref >60)
GFR calc non Af Amer: 52 mL/min — ABNORMAL LOW (ref >60)
Glucose, Bld: 117 mg/dL — ABNORMAL HIGH (ref 65–99)
Potassium: 3.8 mmol/L (ref 3.5–5.1)
Sodium: 138 mmol/L (ref 135–145)

## 2017-01-19 LAB — I-STAT TROPONIN, ED
Troponin i, poc: 0 ng/mL (ref 0.00–0.08)
Troponin i, poc: 0.01 ng/mL (ref 0.00–0.08)

## 2017-01-19 MED ORDER — GI COCKTAIL ~~LOC~~
30.0000 mL | Freq: Once | ORAL | Status: AC
  Administered 2017-01-19: 30 mL via ORAL
  Filled 2017-01-19: qty 30

## 2017-01-19 MED ORDER — NITROGLYCERIN 0.4 MG SL SUBL: 0.4000 mg | SUBLINGUAL_TABLET | Freq: Once | SUBLINGUAL | Status: DC

## 2017-01-19 NOTE — Consult Note (Signed)
Cardiology Consult    Patient ID: [name] MRN: [id number], DOB/AGE: 08/26/1933   Admit date: 01/19/2017 Date of Consult: 01/19/2017  Primary Physician: [name], MD Primary Cardiologist: Stanford Breed Requesting Provider: Eulis Foster Reason for Consultation: Chest pain  [name] is a 83 y.o. male who is being seen today for the evaluation of chest pain at the request of Dr. Eulis Foster.   Patient Profile    83 yo male with PMH of CAD s/p stenting and CABG 4/17, HL, orthostatic hypotension, and GERD who presented with uncomfortable feeling in his chest and throat.   Past Medical History   Past Medical History:  Diagnosis Date  . 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 Colon polyps   . Coronary atherosclerosis of unspecified type of vessel, native or graft   . Difficult intubation   . Esophageal reflux   . Hypertension   . IBS (irritable bowel syndrome)   . Orthostatic hypotension    "BP has been dropping alot when I stand up for the last month or so" (02/17/2016)  . Vitamin B 12 deficiency   . Vitamin D deficiency     Past Surgical History:  Procedure Laterality Date  . BLADDER SURGERY  1969   traumatic pelvic fractures, urethral and bladder repair  . CARDIAC CATHETERIZATION N/A 07/01/2015   Procedure: Left Heart Cath and Coronary Angiography;  Surgeon: Wellington Hampshire, MD;  Location: [facility];  Service: Cardiovascular;  Laterality: N/A;  . CORONARY ANGIOPLASTY WITH STENT PLACEMENT    . CORONARY ARTERY BYPASS GRAFT N/A 07/06/2015   Procedure: CORONARY ARTERY BYPASS GRAFTING (CABG)x 4   utilizing the left internal mammary artery and endoscopically harvested bilateral  sapheneous vein.;  Surgeon: Ivin Poot, MD;  Location: [facility];  Service: Open Heart Surgery;  Laterality: N/A;  . KNEE SURGERY    . TEE WITHOUT CARDIOVERSION N/A 07/06/2015   Procedure: TRANSESOPHAGEAL ECHOCARDIOGRAM (TEE);  Surgeon: Ivin Poot, MD;  Location: [facility];  Service: Open Heart  Surgery;  Laterality: N/A;     Allergies  Allergies  Allergen Reactions  . Cymbalta [Duloxetine Hcl]     Dizziness  . Keflex [Cephalexin] Other (See Comments)    Reaction: unknown   . Simvastatin Other (See Comments)    Reaction: unknown   . Sudafed [Pseudoephedrine]     Dizziness    History of Present Illness    [name] is an 83 yo male with PMH CAD s/p stenting and CABG 4/17, HL, orthostatic hypotension, and GERD. He underwent PCI to LAD in 2009 with subsequent PCI to RCA in 2011. Presented back with chest pain and 3/17 with elevated troponin. At that time revealed three-vessel disease, and underwent CABG 4. Since having bypass he has struggled with orthostatic hypotension and dizziness. He has tried Minitran in the past but per the patient did not work, and could not tolerate Florinef secondary to supine hypertension and lower extremity edema. He has tried TED hose and abdominal binding in the past, but without much success. He was last seen in the office by Kerin Ransom on 10/17 and reported being in his usual state of health. Again noted issues with orthostatic hypotension and he was tried back on Florinef. Reports since his bypass surgery he has had chronic back pain which has been very limiting with his physical activity, but is able to do his day-to-day activities without any issue.  States about a week ago he developed a discomfort in the  left side of his chest, which is been constant. Also has had the sensation of fullness in his throat. States that at times he has difficulty swallowing solid foods, and feels that liquids tend to "come back up". Reports being seen by GI in the remote past that have an upper endoscopy with no acute findings. Reports he called the office this morning with symptoms and was instructed to come to the ED for further evaluation. In comparison to his previous cardiac event states that this is not very similar in nature. Symptoms are not worsened by  activity.  In the ED his labs showed stable electrolytes, creatinine 1.26, POC troponin negative, hemoglobin 12.6. Chest x-ray negative, and EKG sinus rhythm with no acute findings. On exam he is still having mild discomfort in his left chest.  Inpatient Medications    Current Facility-Administered Medications  Medication Dose Route Frequency Provider Last Rate Last Dose  . nitroGLYCERIN (NITROSTAT) SL tablet 0.4 mg  0.4 mg Sublingual Once Cheryln Manly, NP       Current Outpatient Prescriptions  Medication Sig Dispense Refill  . aspirin EC 81 MG EC tablet Take 1 tablet (81 mg total) by mouth daily.    . butalbital-acetaminophen-caffeine (FIORICET, ESGIC) 50-325-40 MG tablet Take 1-2 tablets by mouth every 6 (six) hours as needed for headache. 60 tablet 0  . CALCIUM PO Take 1 tablet by mouth daily.    . Cholecalciferol (VITAMIN D PO) Take 5,000 Units by mouth 2 (two) times daily.    . cyanocobalamin 1000 MCG tablet Take 1,000 mcg by mouth daily.    . diclofenac sodium (VOLTAREN) 1 % GEL Apply 1 application topically 3 (three) times daily. Ribs, back. (Patient taking differently: Apply 1 application topically daily. Ribs, back.) 3 Tube 4  . Flaxseed, Linseed, 1000 MG CAPS Take 1,300 mg by mouth daily.     . fludrocortisone (FLORINEF) 0.1 MG tablet TAKE 2 TABLETS(200 MCG) BY MOUTH DAILY 180 tablet 1  . Magnesium 250 MG TABS Take 500 mg by mouth daily.     . multivitamin (THERAGRAN) per tablet Take 1 tablet by mouth daily.      Marland Kitchen omeprazole (PRILOSEC) 20 MG capsule TAKE 2 CAPSULES BY MOUTH EVERY DAY FOR INDIGESTION 180 capsule 0  . tiZANidine (ZANAFLEX) 2 MG tablet TAKE 1 TABLET(2 MG) BY MOUTH THREE TIMES DAILY AS NEEDED FOR MUSCLE SPASMS 240 tablet 1  . methocarbamol (ROBAXIN) 500 MG tablet Take 1 tablet (500 mg total) by mouth every 6 (six) hours as needed for muscle spasms. (Patient taking differently: Take 250 mg by mouth daily. ) 60 tablet 2     Family History    Family History    Problem Relation Age of Onset  . Heart attack Father        died age 40  . Heart attack Brother        died age 44  . Heart attack Sister        died age 41  . Colon cancer Sister   . Liver cancer Sister   . Diabetes Maternal Grandmother   . Colon polyps Sister        and brothers x 2     Social History    Social History   Social History  . Marital status: Married    Spouse name: N/A  . Number of children: 4   . Years of education: N/A   Occupational History  . Retired    Social History Main Topics  .  Smoking status: Former Smoker    Quit date: 07/16/1963  . Smokeless tobacco: Never Used  . Alcohol use No  . Drug use: No  . Sexual activity: Not Currently   Other Topics Concern  . Not on file   Social History Narrative   1 caffeine drink daily      Review of Systems    See HPI  All other systems reviewed and are otherwise negative except as noted above.  Physical Exam    Blood pressure (!) 174/85, pulse (!) 58, temperature 98 F (36.7 C), temperature source Oral, resp. rate 10, SpO2 97 %.  General: Pleasant, NAD Psych: Normal affect. Neuro: Alert and oriented X 3. Moves all extremities spontaneously. HEENT: Normal  Neck: Supple without bruits or JVD. Lungs:  Resp regular and unlabored, CTA. Healed sternotomy scar Heart: RRR no s3, s4, or murmurs. Abdomen: Soft, non-tender, non-distended, BS + x 4.  Extremities: No clubbing, cyanosis or edema. DP/PT/Radials 2+ and equal bilaterally.  Labs    Troponin [facility] Test)  Recent Labs  01/19/17 1142  TROPIPOC 0.00   No results for input(s): CKTOTAL, CKMB, TROPONINI in the last 72 hours. Lab Results  Component Value Date   WBC 8.0 01/19/2017   HGB 12.6 (L) 01/19/2017   HCT 39.5 01/19/2017   MCV 90.4 01/19/2017   PLT 232 01/19/2017    Recent Labs Lab 01/19/17 1122  NA 138  K 3.8  CL 105  CO2 26  BUN 14  CREATININE 1.26*  CALCIUM 8.8*  GLUCOSE 117*   Lab Results  Component Value  Date   CHOL 168 11/29/2016   HDL 35 (L) 11/29/2016   LDLCALC 110 (H) 11/29/2016   TRIG 113 11/29/2016   No results found for: [facility]   Radiology Studies    Dg Chest 2 View  Result Date: 01/19/2017 CLINICAL DATA:  Chest pain EXAM: CHEST  2 VIEW COMPARISON:  February 17, 2016 FINDINGS: There is no edema or consolidation. There is slight scarring in the left lower lobe. Heart size and pulmonary vascularity are normal. Patient is status post coronary artery bypass grafting. No adenopathy. There are foci of degenerative change in thoracic spine. IMPRESSION: No edema or consolidation. Slight scarring left lower lobe. Stable cardiac silhouette. Electronically Signed   By: Lowella Grip III M.D.   On: 01/19/2017 12:06    ECG & Cardiac Imaging    EKG: Sinus rhythm  Echo: 3/17  Study Conclusions  - Left ventricle: The cavity size was normal. There was mild   concentric hypertrophy. Systolic function was normal. The   estimated ejection fraction was in the range of 60% to 65%. Wall   motion was normal; there were no regional wall motion   abnormalities. Doppler parameters are consistent with abnormal   left ventricular relaxation (grade 1 diastolic dysfunction). - Aortic valve: Transvalvular velocity was within the normal range.   There was no stenosis. There was no regurgitation. - Mitral valve: Transvalvular velocity was within the normal range.   There was no evidence for stenosis. There was no regurgitation. - Left atrium: The atrium was mildly dilated. - Right ventricle: The cavity size was normal. Wall thickness was   normal. Systolic function was normal. - Atrial septum: No defect or patent foramen ovale was identified   by color flow Doppler. - Tricuspid valve: There was mild regurgitation. - Inferior vena cava: The vessel was normal in size.  Assessment & Plan    83 yo male with  PMH of CAD s/p stenting and CABG 4/17, HL, orthostatic hypotension, and GERD who presented  with uncomfortable feeling in his chest and throat.   1. Atypical chest discomfort: Pertinent symptoms are very atypical in nature, and have been persistent for around a week. Initial troponin negative 1, EKG nonacute. Suspect symptoms are more GI in nature. Will give GI cocktail times one now. Check second troponin. If follow-up troponin negative okay to DC home, suggest follow-up with PCP/GI in regards to symptoms. If positive admit for further workup.  2. CAD status post stenting and CABG: Remains on 81 mg aspirin, but other therapies have been limited with his history of orthostatic hypotension.  3. Hyperlipidemia: Noted to be intolerant to statins in the past.  4. Orthostatic hypotension: Is been on multiple medications, with adjustments since his bypass surgery. Current home regimen includes Florinef  5. GERD: Currently on Prilosec at home, may consider switching ppi symptoms persist  Signed, Reino Bellis, NP-C Pager [phone] 01/19/2017, 2:22 PM  Attending Note:   The patient was seen and examined.  Agree with assessment and plan as noted above.  Changes made to the above note as needed.  Patient seen and independently examined with Reino Bellis, NP .   We discussed all aspects of the encounter. I agree with the assessment and plan as stated above.  1. Chest pain: These chest pains are very atypical. They have been present for over a week. The pains do not increase with exertion, deep breath. The pains seem to be improved after eating. He has not taken nitroglycerin. We will give him a GI cocktail to see if that improves the pain. We can also try a nitroglycerin. We'll check a delta troponin. If the troponin remains negative then I think he can safely go home. We can arrange for outpatient evaluation. His EKG does not show any acute ST segment changes.  2. Coronary artery disease: He has not had any anginal like chest pains.  3. Hyperlipidemia: Is reportedly intolerant  to statins. We should consider him for one of the injectable PSK 9 inhibitors  4. Orthostatic hypotension:    Continue florinef    I have spent a total of 40 minutes with patient reviewing hospital  notes , telemetry, EKGs, labs and examining patient as well as establishing an assessment and plan that was discussed with the patient. > 50% of time was spent in direct patient care.   Thayer Headings, Brooke Bonito., MD, [facility] 01/19/2017, 3:50 PM [address],  [place] Pager [phone]

## 2017-01-19 NOTE — ED Provider Notes (Signed)
Care assumed from previous provider PA Carlota Raspberry. Please see their note for further details to include full history and physical. To summarize in short pt is a 83 year old male past medical history significant for CAD status post PCI presents with chest pain and vaginal going for 1 week.. Case discussed, plan agreed upon.  Patient was seen and evaluated by Cardiology who recommends gi cocktail and delta trop. If symptoms improve and delta trop negative may be discharged home with outpatient follow-up.  Delta troponin was negative. Patient feels improved. Ready for discharge.  Pt is hemodynamically stable, in NAD, & able to ambulate in the ED. Evaluation does not show pathology that would require ongoing emergent intervention or inpatient treatment. I explained the diagnosis to the patient. Pain has been managed & has no complaints prior to dc. Pt is comfortable with above plan and is stable for discharge at this time. All questions were answered prior to disposition. Strict return precautions for f/u to the ED were discussed. Encouraged follow up with PCP.        Dennis, [place], PA-C 01/19/17 1547    Daleen Bo, MD 01/19/17 1608    Daleen Bo, MD 01/19/17 [phone]

## 2017-01-19 NOTE — ED Triage Notes (Signed)
Pt states for the last week he has been having left sided chest pain that goes into his left neck and he feels nauseous, pt also states he had had moments where he breaks out into a cold sweat.

## 2017-01-19 NOTE — Telephone Encounter (Signed)
New message    Pt is calling stating his PCP told him to call us. He would like to speak to nurse about the way he is feeling.  Patient c/o Palpitations:  High priority if patient c/o lightheadedness and shortness of breath.  1. How long have you been having palpitations? A week, pt states it will go slow then fast and feels like it's skipping a beat. He said it feels like something is in there that shouldn't be.   2. Are you currently experiencing lightheadedness and shortness of breath? no  3. Have you checked your BP and heart rate? (document readings) BP-147/81 P-70 this morning  4. Are you experiencing any other symptoms? No, not right now. He said it feels like when he was having problems and had to have the stent placed.   Please call.

## 2017-01-19 NOTE — ED Provider Notes (Signed)
  Face-to-face evaluation   History: He presents for evaluation of chest discomfort constant for 1 week, felt as a unusual sensation in his left chest.  He is unable to describe it further.  He occasionally has palpitations.  He was concerned about his heart so called his cardiologist who decided to send him here for evaluation.  Physical exam: Alert elderly man who appears comfortable.  Heart regular rate and rhythm no murmur lungs clear anteriorly.  Chest nontender to palpation.  Medical screening examination/treatment/procedure(s) were conducted as a shared visit with non-physician practitioner(s) and myself.  I personally evaluated the patient during the encounter   Daleen Bo, MD 01/19/17 [phone]

## 2017-01-19 NOTE — Telephone Encounter (Signed)
Spoke with pt, for over one week or more he has had a funny feeling in his chest that will ease and then get worse. The feeling goes up into his neck, he has slight nausea and hot flashes. He also reports palpitations mainly at night. He reports this is the same as when he had his previous cabg and stents. He was advised to have his wife take him to [facility] er for evaluation. Hospital team made aware.

## 2017-01-19 NOTE — Discharge Instructions (Signed)
All of your imaging and lab work has been reassuring. Your chest pain does not appear to be cardiac in nature. Would suggest following up with your primary care doctor for possible further GI workup if symptoms persist. He may also follow-up with a cardiologist as needed. Return to the ED if he develops any worsening symptoms. May use an over-the-counter proton pump inhibitor including Nexium and Prilosec once a day.

## 2017-01-19 NOTE — ED Provider Notes (Signed)
[facility] DEPT Provider Note   CSN: [id number] Arrival date & time: 01/19/17  1110   History   Chief Complaint Chief Complaint  Patient presents with  . Chest Pain    HPI [name] is a 83 y.o. male.  HPI   83 y/o male with known CAD s/p PCI to LAD 2009 and RCA in 2011. He presented to the ED 07/01/15 with chest pain and elevated Troponin. Cath revealed 3 V CAD, echo showed normal LVF. He underwent CABG x 4 07/07/15.   The patient called the cardiologist office on the phone because he has been having pain for over one week. He describes it as a "funny feeling" in his chest that will ease and then get worse. He describes the pain in his chest and then move up into his neck. He has been having hot flashes and nausea with this. His pain is worse when he lays flat at night. He is concerned but these symptoms remind him of how he felt prior to having his CABG and stents. The office told him to go to the ER for evaluation.  He describes having some dysphagia, when he eats the food briefly feels like it gets stuck in his throat but no pain, he last ate last night but is still having his chest sensation today.   Past Medical History:  Diagnosis Date  . 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 Colon polyps   . Coronary atherosclerosis of unspecified type of vessel, native or graft   . Difficult intubation   . Esophageal reflux   . Hypertension   . IBS (irritable bowel syndrome)   . Orthostatic hypotension    "BP has been dropping alot when I stand up for the last month or so" (02/17/2016)  . Vitamin B 12 deficiency   . Vitamin D deficiency     Patient Active Problem List   Diagnosis Date Noted  . Primary osteoarthritis of right knee 12/14/2016  . DDD (degenerative disc disease), lumbar 10/10/2016  . Lumbar facet arthropathy ([place]) 08/29/2016  . Idiopathic scoliosis 03/22/2016  . Diabetic neuropathy ([place]) 03/22/2016  . Dysautonomia orthostatic hypotension syndrome ([place])  02/25/2016  . CKD (chronic kidney disease), stage III 02/19/2016  . Neuropathic pain of foot 02/18/2016  . Coronary artery disease involving coronary bypass graft of native heart without angina pectoris   . Diabetes mellitus type 2, diet-controlled ([place])   . 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 Medication management 08/11/2015  . Orthostatic hypotension 07/24/2015  . 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 NSTEMI (non-ST elevated myocardial infarction) ([place]) 07/01/2015  . BMI 25.0-25.9,adult 02/02/2015  . PVD (peripheral vascular disease) ([place]) 01/01/2014  . Mixed hyperlipidemia 04/11/2013  . Prediabetes 04/11/2013  . Supine hypertension   . Vitamin D deficiency   . Vitamin B 12 deficiency   . Coronary atherosclerosis- s/p PCI to LAD in 2009 and PCI to RCA in 2011 02/27/2009  . Abdominal aortic aneurysm ([place]) 02/27/2009  . GERD 02/27/2009  . History of IBS 02/27/2009    Past Surgical History:  Procedure Laterality Date  . BLADDER SURGERY  1969   traumatic pelvic fractures, urethral and bladder repair  . CARDIAC CATHETERIZATION N/A 07/01/2015   Procedure: Left Heart Cath and Coronary Angiography;  Surgeon: Wellington Hampshire, MD;  Location: [facility];  Service: Cardiovascular;  Laterality: N/A;  . CORONARY ANGIOPLASTY WITH STENT PLACEMENT    . CORONARY ARTERY BYPASS GRAFT N/A 07/06/2015   Procedure: CORONARY ARTERY  BYPASS GRAFTING (CABG)x 4   utilizing the left internal mammary artery and endoscopically harvested bilateral  sapheneous vein.;  Surgeon: Ivin Poot, MD;  Location: [facility];  Service: Open Heart Surgery;  Laterality: N/A;  . KNEE SURGERY    . TEE WITHOUT CARDIOVERSION N/A 07/06/2015   Procedure: TRANSESOPHAGEAL ECHOCARDIOGRAM (TEE);  Surgeon: Ivin Poot, MD;  Location: [facility];  Service: Open Heart Surgery;  Laterality: N/A;       Home Medications    Prior to Admission medications   Medication Sig Start Date End Date Taking? Authorizing Provider  aspirin EC 81 MG EC  tablet Take 1 tablet (81 mg total) by mouth daily. 07/11/15   John Giovanni, PA-C  butalbital-acetaminophen-caffeine [place], ESGIC) [phone] MG tablet Take 1-2 tablets by mouth every 6 (six) hours as needed for headache. 11/29/16 11/29/17  [name], MD  CALCIUM PO Take 1 tablet by mouth daily.    [provider]  Cholecalciferol (VITAMIN D PO) Take 5,000 Units by mouth 2 (two) times daily.    [provider]  cyanocobalamin 1000 MCG tablet Take 1,000 mcg by mouth daily.    [provider]  diclofenac sodium (VOLTAREN) 1 % GEL Apply 1 application topically 3 (three) times daily. Ribs, back. 05/03/16   Meredith Staggers, MD  Flaxseed, Linseed, 1000 MG CAPS Take 1,300 mg by mouth daily.     [provider]  fludrocortisone (FLORINEF) 0.1 MG tablet TAKE 2 TABLETS(200 MCG) BY MOUTH DAILY 08/20/16   [name], MD  Magnesium 250 MG TABS Take 500 mg by mouth daily.     [provider]  methocarbamol (ROBAXIN) 500 MG tablet Take 1 tablet (500 mg total) by mouth every 6 (six) hours as needed for muscle spasms. Patient taking differently: Take 250 mg by mouth daily.  07/04/16   Meredith Staggers, MD  multivitamin [facility]) per tablet Take 1 tablet by mouth daily.      [provider]  omeprazole (PRILOSEC) 20 MG capsule TAKE 2 CAPSULES BY MOUTH EVERY DAY FOR INDIGESTION 09/21/16   [name], MD  tiZANidine (ZANAFLEX) 2 MG tablet TAKE 1 TABLET(2 MG) BY MOUTH THREE TIMES DAILY AS NEEDED FOR MUSCLE SPASMS 12/23/16   Meredith Staggers, MD    Family History Family History  Problem Relation Age of Onset  . Heart attack Father        died age 30  . Heart attack Brother        died age 64  . Heart attack Sister        died age 8  . Colon cancer Sister   . Liver cancer Sister   . Diabetes Maternal Grandmother   . Colon polyps Sister        and brothers x 2     Social History Social History  Substance Use Topics  . Smoking status:  Former Smoker    Quit date: 07/16/1963  . Smokeless tobacco: Never Used  . Alcohol use No     Allergies   Cymbalta [duloxetine hcl]; Keflex [cephalexin]; Simvastatin; and Sudafed [pseudoephedrine]   Review of Systems Review of Systems  Negative ROS aside from pertinent positives and negatives as listed in HPI  Physical Exam Updated Vital Signs BP (!) 171/76   Pulse 63   Temp 98 F (36.7 C) (Oral)   Resp 12   SpO2 97%   Physical Exam  Constitutional: He is oriented to person, place, and time. He appears well-developed  and well-nourished.  Elderly white male  HENT:  Head: Normocephalic and atraumatic.  Eyes: Pupils are equal, round, and reactive to light. EOM are normal.  Neck: Normal range of motion.  Cardiovascular: Normal rate and regular rhythm.   Pulmonary/Chest: Effort normal and breath sounds normal.  Musculoskeletal: Normal range of motion.  Neurological: He is alert and oriented to person, place, and time.  Skin: Skin is warm and dry.     ED Treatments / Results  Labs (all labs ordered are listed, but only abnormal results are displayed) Labs Reviewed  BASIC METABOLIC PANEL - Abnormal; Notable for the following:       Result Value   Glucose, Bld 117 (*)    Creatinine, Ser 1.26 (*)    Calcium 8.8 (*)    GFR calc non Af Amer 52 (*)    GFR calc Af Amer 60 (*)    All other components within normal limits  CBC - Abnormal; Notable for the following:    Hemoglobin 12.6 (*)    All other components within normal limits  I-STAT TROPONIN, ED    EKG  EKG Interpretation  Date/Time:  Thursday January 19 2017 11:17:15 EDT Ventricular Rate:  81 PR Interval:  196 QRS Duration: 94 QT Interval:  400 QTC Calculation: 464 R Axis:   79 Text Interpretation:  Normal sinus rhythm Normal ECG since last tracing no significant change Confirmed by Daleen Bo [phone]) on 01/19/2017 1:26:09 PM       Radiology Dg Chest 2 View  Result Date: 01/19/2017 CLINICAL  DATA:  Chest pain EXAM: CHEST  2 VIEW COMPARISON:  February 17, 2016 FINDINGS: There is no edema or consolidation. There is slight scarring in the left lower lobe. Heart size and pulmonary vascularity are normal. Patient is status post coronary artery bypass grafting. No adenopathy. There are foci of degenerative change in thoracic spine. IMPRESSION: No edema or consolidation. Slight scarring left lower lobe. Stable cardiac silhouette. Electronically Signed   By: Lowella Grip III M.D.   On: 01/19/2017 12:06    Procedures Procedures (including critical care time)    He has had an unremarkable work-up here in the ED. Normal chest xray, neg EKG, neg Trop. He has voiced having some dysphagia. His symptoms could be due to this, however the last time he ate was last night and he is still having his discomfort. The patient is very concerned and he and his wife want to see the cardiologist. I reviewed the case with Dr. Eulis Foster whose recommendation for the patient is for cardiology to come and evaluate him. Therefore I will request a cardiology consult.  At end of shift, patient sign out to T. Leaphart, PA-C. Patient disposition per cardiology recommendations.   Final Clinical Impressions(s) / ED Diagnoses   Final diagnoses:  Chest pain, unspecified type    New Prescriptions New Prescriptions   No medications on file     Delos Haring, PA-C 01/19/17 1334    Delos Haring, PA-C 01/19/17 1350    Daleen Bo, MD 01/19/17 [phone]

## 2017-02-02 IMAGING — CR DG CHEST 1V PORT
1 series · 1 of 1 positions shown · non-contrast
Comparison: 07/07/2015

CLINICAL DATA: Status post CABG.

EXAM:
PORTABLE CHEST 1 VIEW

[AP]
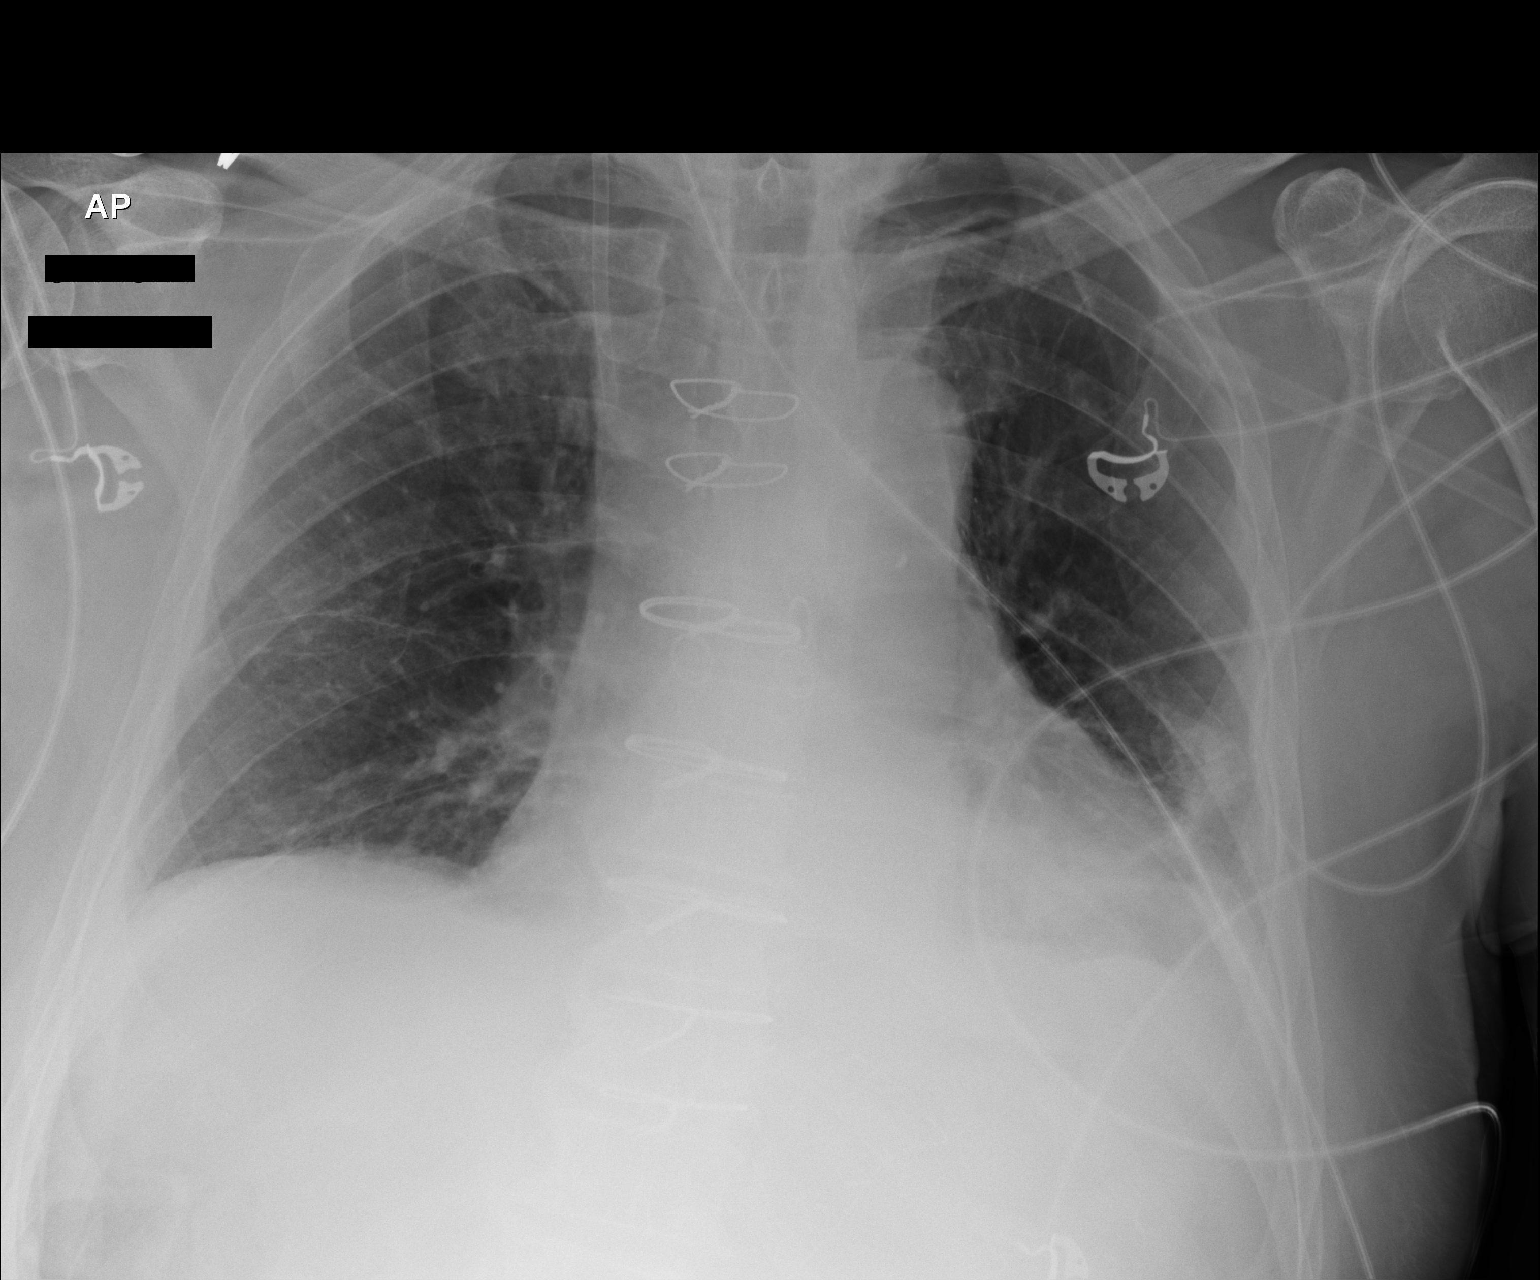

[1 of 1 positions shown; findings below may reference images not displayed]

FINDINGS: Swan-Ganz catheter, mediastinal drain left chest tube have been
removed. There may be a tiny left apical pneumothorax of less than
5% volume. Mild increase in left basilar atelectasis. No edema or
significant pleural fluid identified. The heart size and mediastinal
contours are stable.
IMPRESSION: Possible tiny left apical pneumothorax of less than 5% volume. Mild
increase in left lower lobe atelectasis.

## 2017-02-14 ENCOUNTER — Encounter: Payer: Medicare Other | Attending: Physical Medicine & Rehabilitation | Admitting: Physical Medicine & Rehabilitation

## 2017-02-14 ENCOUNTER — Other Ambulatory Visit: Payer: Self-pay | Admitting: Internal Medicine

## 2017-02-14 ENCOUNTER — Encounter: Payer: Self-pay | Admitting: Physical Medicine & Rehabilitation

## 2017-02-14 VITALS — BP 158/80 | HR 74

## 2017-02-14 DIAGNOSIS — Z955 Presence of coronary angioplasty implant and graft: Secondary | ICD-10-CM | POA: Insufficient documentation

## 2017-02-14 DIAGNOSIS — Z79899 Other long term (current) drug therapy: Secondary | ICD-10-CM | POA: Insufficient documentation

## 2017-02-14 DIAGNOSIS — M47816 Spondylosis without myelopathy or radiculopathy, lumbar region: Secondary | ICD-10-CM | POA: Diagnosis not present

## 2017-02-14 DIAGNOSIS — K589 Irritable bowel syndrome without diarrhea: Secondary | ICD-10-CM | POA: Insufficient documentation

## 2017-02-14 DIAGNOSIS — M545 Low back pain: Secondary | ICD-10-CM | POA: Insufficient documentation

## 2017-02-14 DIAGNOSIS — E559 Vitamin D deficiency, unspecified: Secondary | ICD-10-CM | POA: Diagnosis not present

## 2017-02-14 DIAGNOSIS — G588 Other specified mononeuropathies: Secondary | ICD-10-CM | POA: Diagnosis not present

## 2017-02-14 DIAGNOSIS — I10 Essential (primary) hypertension: Secondary | ICD-10-CM | POA: Diagnosis not present

## 2017-02-14 DIAGNOSIS — G8929 Other chronic pain: Secondary | ICD-10-CM | POA: Insufficient documentation

## 2017-02-14 DIAGNOSIS — K219 Gastro-esophageal reflux disease without esophagitis: Secondary | ICD-10-CM | POA: Diagnosis not present

## 2017-02-14 DIAGNOSIS — I2510 Atherosclerotic heart disease of native coronary artery without angina pectoris: Secondary | ICD-10-CM | POA: Diagnosis not present

## 2017-02-14 DIAGNOSIS — Z8249 Family history of ischemic heart disease and other diseases of the circulatory system: Secondary | ICD-10-CM | POA: Insufficient documentation

## 2017-02-14 DIAGNOSIS — M4185 Other forms of scoliosis, thoracolumbar region: Secondary | ICD-10-CM | POA: Diagnosis not present

## 2017-02-14 DIAGNOSIS — Z951 Presence of aortocoronary bypass graft: Secondary | ICD-10-CM | POA: Insufficient documentation

## 2017-02-14 DIAGNOSIS — E785 Hyperlipidemia, unspecified: Secondary | ICD-10-CM | POA: Insufficient documentation

## 2017-02-14 DIAGNOSIS — G903 Multi-system degeneration of the autonomic nervous system: Secondary | ICD-10-CM

## 2017-02-14 DIAGNOSIS — R0789 Other chest pain: Secondary | ICD-10-CM | POA: Diagnosis not present

## 2017-02-14 DIAGNOSIS — Z87891 Personal history of nicotine dependence: Secondary | ICD-10-CM | POA: Insufficient documentation

## 2017-02-14 DIAGNOSIS — E1142 Type 2 diabetes mellitus with diabetic polyneuropathy: Secondary | ICD-10-CM | POA: Insufficient documentation

## 2017-02-14 DIAGNOSIS — I951 Orthostatic hypotension: Secondary | ICD-10-CM

## 2017-02-14 MED ORDER — TRAMADOL HCL 50 MG PO TABS: 50.0000 mg | ORAL_TABLET | Freq: Two times a day (BID) | ORAL | 1 refills | Status: DC | PRN

## 2017-02-14 NOTE — Progress Notes (Signed)
Subjective:    Patient ID: [name], male    DOB: 06/12/1933, 83 y.o.   MRN: [id number]  HPI  Nothing touching rib pain.  Tizanidine and methocarbamol did nothing.  Pain Inventory Average Pain 5 Pain Right Now 5 My pain is stabbing  In the last 24 hours, has pain interfered with the following? General activity 2 Relation with others 5 Enjoyment of life 3 What TIME of day is your pain at its worst? daytime Sleep (in general) Fair  Pain is worse with: walking, bending, standing and some activites Pain improves with: nothing Relief from Meds: 2  Mobility use a cane  Function retired  Neuro/Psych No problems in this area  Prior Studies Any changes since last visit?  no  Physicians involved in your care Any changes since last visit?  no   Family History  Problem Relation Age of Onset  . Heart attack Father        died age 98  . Heart attack Brother        died age 103  . Heart attack Sister        died age 75  . Colon cancer Sister   . Liver cancer Sister   . Diabetes Maternal Grandmother   . Colon polyps Sister        and brothers x 2    Social History   Social History  . Marital status: Married    Spouse name: N/A  . Number of children: 4   . Years of education: N/A   Occupational History  . Retired    Social History Main Topics  . Smoking status: Former Smoker    Quit date: 07/16/1963  . Smokeless tobacco: Never Used  . Alcohol use No  . Drug use: No  . Sexual activity: Not Currently   Other Topics Concern  . Not on file   Social History Narrative   1 caffeine drink daily    Past Surgical History:  Procedure Laterality Date  . BLADDER SURGERY  1969   traumatic pelvic fractures, urethral and bladder repair  . CARDIAC CATHETERIZATION N/A 07/01/2015   Procedure: Left Heart Cath and Coronary Angiography;  Surgeon: Wellington Hampshire, MD;  Location: [facility];  Service: Cardiovascular;  Laterality: N/A;  . CORONARY ANGIOPLASTY  WITH STENT PLACEMENT    . CORONARY ARTERY BYPASS GRAFT N/A 07/06/2015   Procedure: CORONARY ARTERY BYPASS GRAFTING (CABG)x 4   utilizing the left internal mammary artery and endoscopically harvested bilateral  sapheneous vein.;  Surgeon: Ivin Poot, MD;  Location: [facility];  Service: Open Heart Surgery;  Laterality: N/A;  . KNEE SURGERY    . TEE WITHOUT CARDIOVERSION N/A 07/06/2015   Procedure: TRANSESOPHAGEAL ECHOCARDIOGRAM (TEE);  Surgeon: Ivin Poot, MD;  Location: [facility];  Service: Open Heart Surgery;  Laterality: N/A;   Past Medical History:  Diagnosis Date  . 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 Colon polyps   . Coronary atherosclerosis of unspecified type of vessel, native or graft   . Difficult intubation   . Esophageal reflux   . Hypertension   . IBS (irritable bowel syndrome)   . Orthostatic hypotension    "BP has been dropping alot when I stand up for the last month or so" (02/17/2016)  . Vitamin B 12 deficiency   . Vitamin D deficiency    There were no vitals taken for this visit.  Opioid Risk Score:   Fall Risk Score:  `  1  Depression screen PHQ 2/9  Depression screen [facility] 2/9 11/29/2016 03/22/2016 02/09/2016 10/29/2015 09/23/2015 08/11/2015 05/14/2015  Decreased Interest 0 3 0 0 0 0 0  Down, Depressed, Hopeless 0 1 0 0 0 0 0  PHQ - 2 Score 0 4 0 0 0 0 0  Altered sleeping - 2 - - - - -  Tired, decreased energy - 3 - - - - -  Change in appetite - 1 - - - - -  Feeling bad or failure about yourself  - 2 - - - - -  Trouble concentrating - 2 - - - - -  Moving slowly or fidgety/restless - 1 - - - - -  Suicidal thoughts - 0 - - - - -  PHQ-9 Score - 15 - - - - -  Difficult doing work/chores - Extremely dIfficult - - - - -     Review of Systems  Constitutional: Positive for diaphoresis.  HENT: Negative.   Eyes: Negative.   Respiratory: Negative.   Cardiovascular: Negative.   Gastrointestinal: Negative.   Endocrine: Negative.   Genitourinary: Negative.     Musculoskeletal:       Rib/back pain  Skin: Negative.   Allergic/Immunologic: Negative.   Neurological: Negative.   Hematological: Negative.   Psychiatric/Behavioral: Negative.   All other systems reviewed and are negative.      Objective:   Physical Exam  General: Alert and oriented x 3, No apparent distress HEENT:Head is normocephalic, atraumatic, PERRLA, EOMI, sclera anicteric, oral mucosa pink and moist, dentition intact, ext ear canals clear,  Neck:Supple without JVD or lymphadenopathy Heart:RRR Chest:normal effort Abdomen:Soft, non-tender, non-distended, bowel sounds positive. Extremities:No clubbing, cyanosis, or edema. Pulses are 2+ Skin:Clean and intact without signs of breakdown. CABG incision along sternum clean/intact Neuro:Pt is cognitively appropriate with normal insight, memory, and awareness. Cranial nerves 2-12 are intact. Decreased sensation to LT in both feet, does seem to sense pain.Reflexes are 2+ in all 4's. Fine motor coordination is intact. No tremors. Motor function is grossly 5/5.  Musculoskeletal:left rib tenderness along #8.Marland Kitchen Low back ROM stable.   SLR was negative. FABER wsa negative. Extension caused some pain in his right leg at knee. He has right lateral joint line pain at the knee. +crepitus right knee, Meniscal maneuver equivocal. . He has a dextroscoliosis with apex around the L3 level unchanged. Marland Kitchen  Psych:pleasant        1. Chronic right chest wall pain, intercostal neuritis, ribs 6 and 7. Likely triggered by recent CABG in March. Pain is recurrent and pt also having tenderness on the left at 8-9 again---has responded to blocks well in past..  2. Chronic low back pain, thoracic levoscoliosis and lumbar dextroscoliosis. Equivocal results with MBB's. critical DDD at L3-L5 and foraminal stenosis at these levels. Good results with recent right L4-5 T-LESI.  3. Diabetic polyneuropathy with ?autonomic dysfunction, hypotension 4. Right  knee pain. Likely old meniscal injury/osteoarthritis   Plan: 1. Continue with HEP for back. Encouraged activity "within reason".  2. Gabapentin 100mg  tid per primary. 3. Voltaren gel and lidocaine gel to back/ribs.  4. After informed consent and preparation of the skin with betadine and isopropyl alcohol, I injected 6mg  (1cc) of celestone and 2cc of 1% lidocaine around  the left 8tth rib via posterior approach. Additionally, aspiration was performed prior to injection. The patient tolerated well, and no complications were encountered. Afterward the area was cleaned and dressed. Post- injection instructions were provided.    5. Trial  of robaxin for muscle spasms. 6.  Follow up with me in about 2 months. 40minutes of face to face patient care time were spent during this visit. All questions were encouraged and answered.

## 2017-02-14 NOTE — Patient Instructions (Signed)
PLEASE FEEL FREE TO CALL OUR OFFICE WITH ANY PROBLEMS OR QUESTIONS ([phone])      

## 2017-02-20 DIAGNOSIS — M9902 Segmental and somatic dysfunction of thoracic region: Secondary | ICD-10-CM | POA: Diagnosis not present

## 2017-02-20 DIAGNOSIS — M5134 Other intervertebral disc degeneration, thoracic region: Secondary | ICD-10-CM | POA: Diagnosis not present

## 2017-02-22 DIAGNOSIS — M9902 Segmental and somatic dysfunction of thoracic region: Secondary | ICD-10-CM | POA: Diagnosis not present

## 2017-02-22 DIAGNOSIS — M5134 Other intervertebral disc degeneration, thoracic region: Secondary | ICD-10-CM | POA: Diagnosis not present

## 2017-03-01 DIAGNOSIS — M9902 Segmental and somatic dysfunction of thoracic region: Secondary | ICD-10-CM | POA: Diagnosis not present

## 2017-03-01 DIAGNOSIS — M5134 Other intervertebral disc degeneration, thoracic region: Secondary | ICD-10-CM | POA: Diagnosis not present

## 2017-03-02 IMAGING — CR DG CHEST 2V
2 series · 2 of 2 positions shown · non-contrast
Comparison: 07/09/2015

CLINICAL DATA: CABG 07/06/2015

EXAM:
CHEST  2 VIEW

[w chest pa]
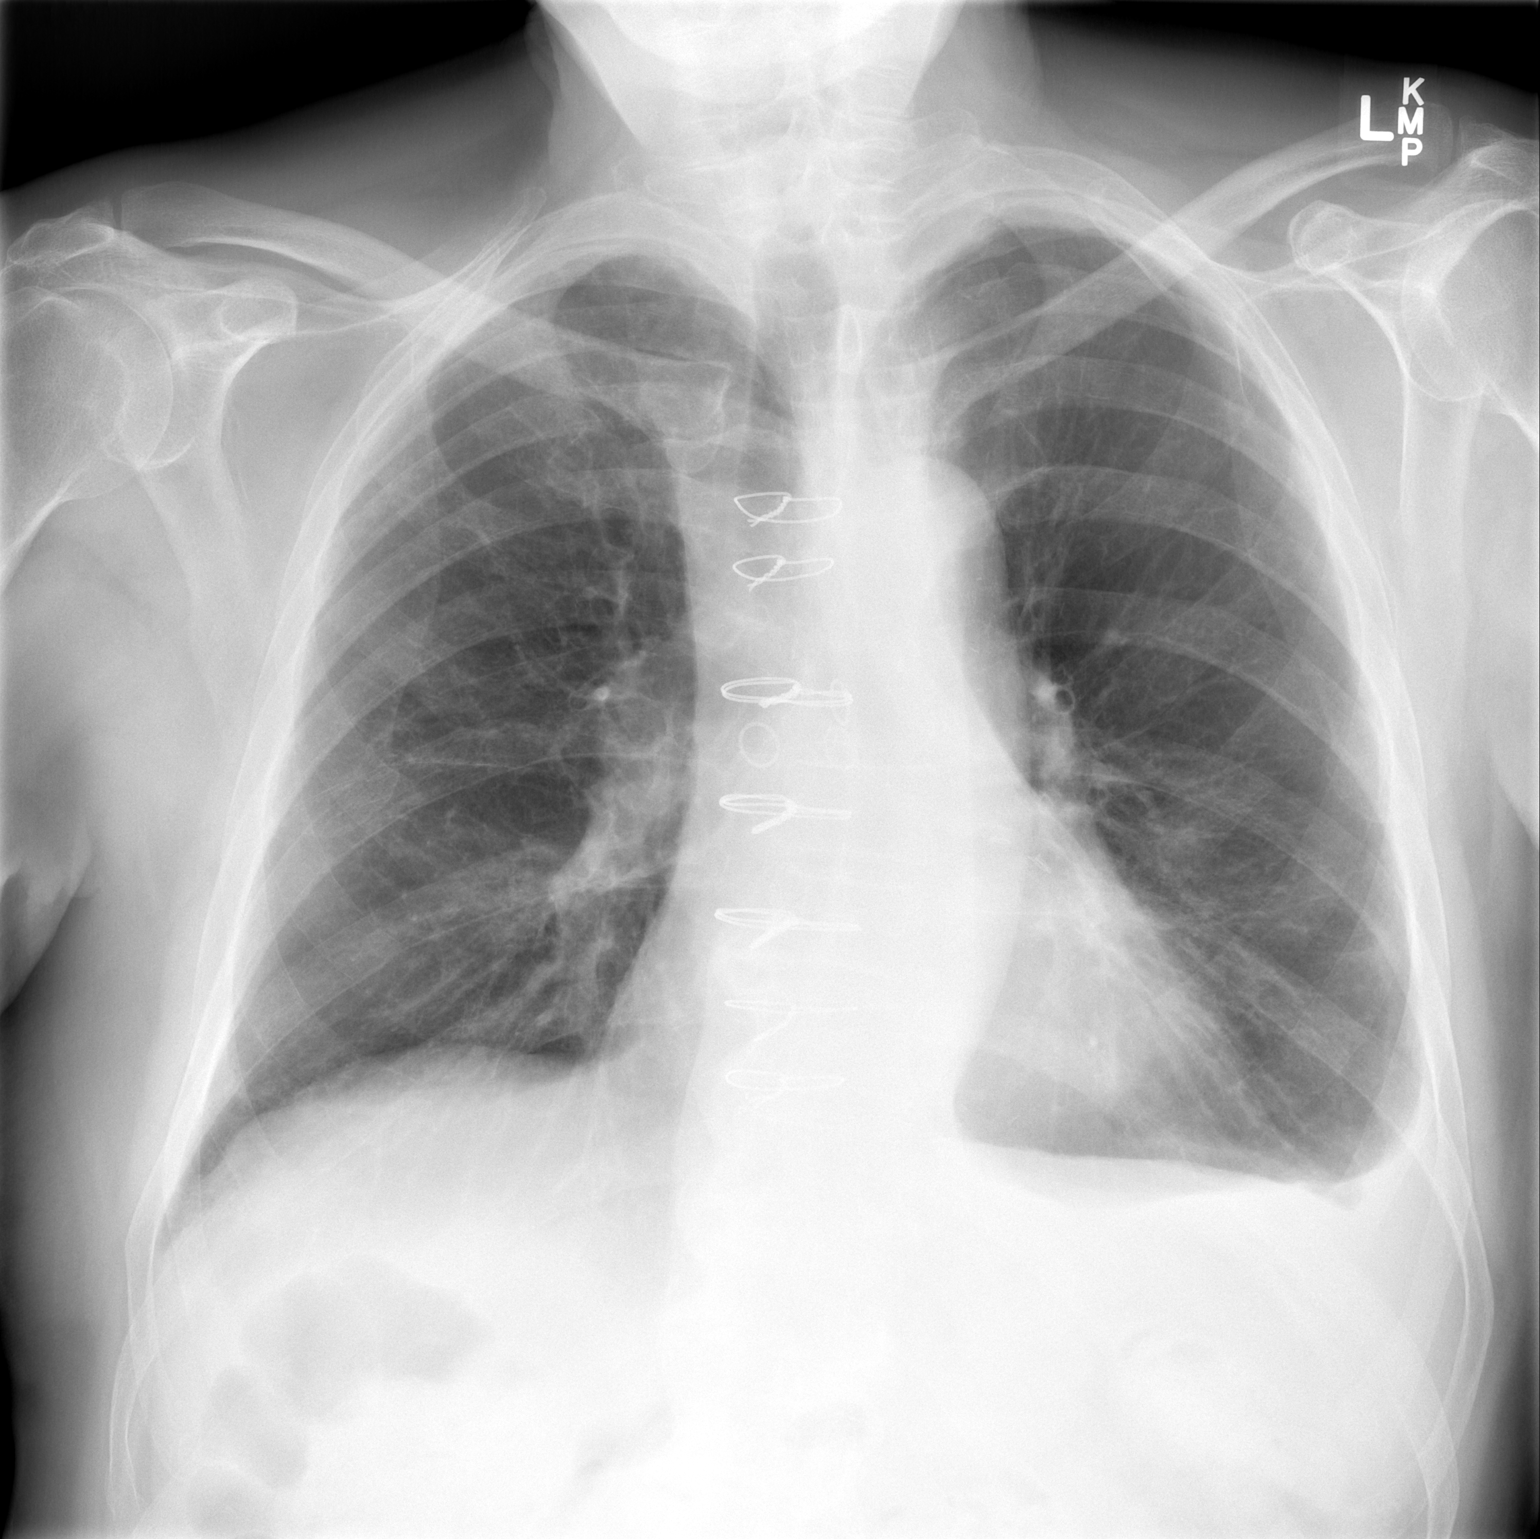

[w chest lat]
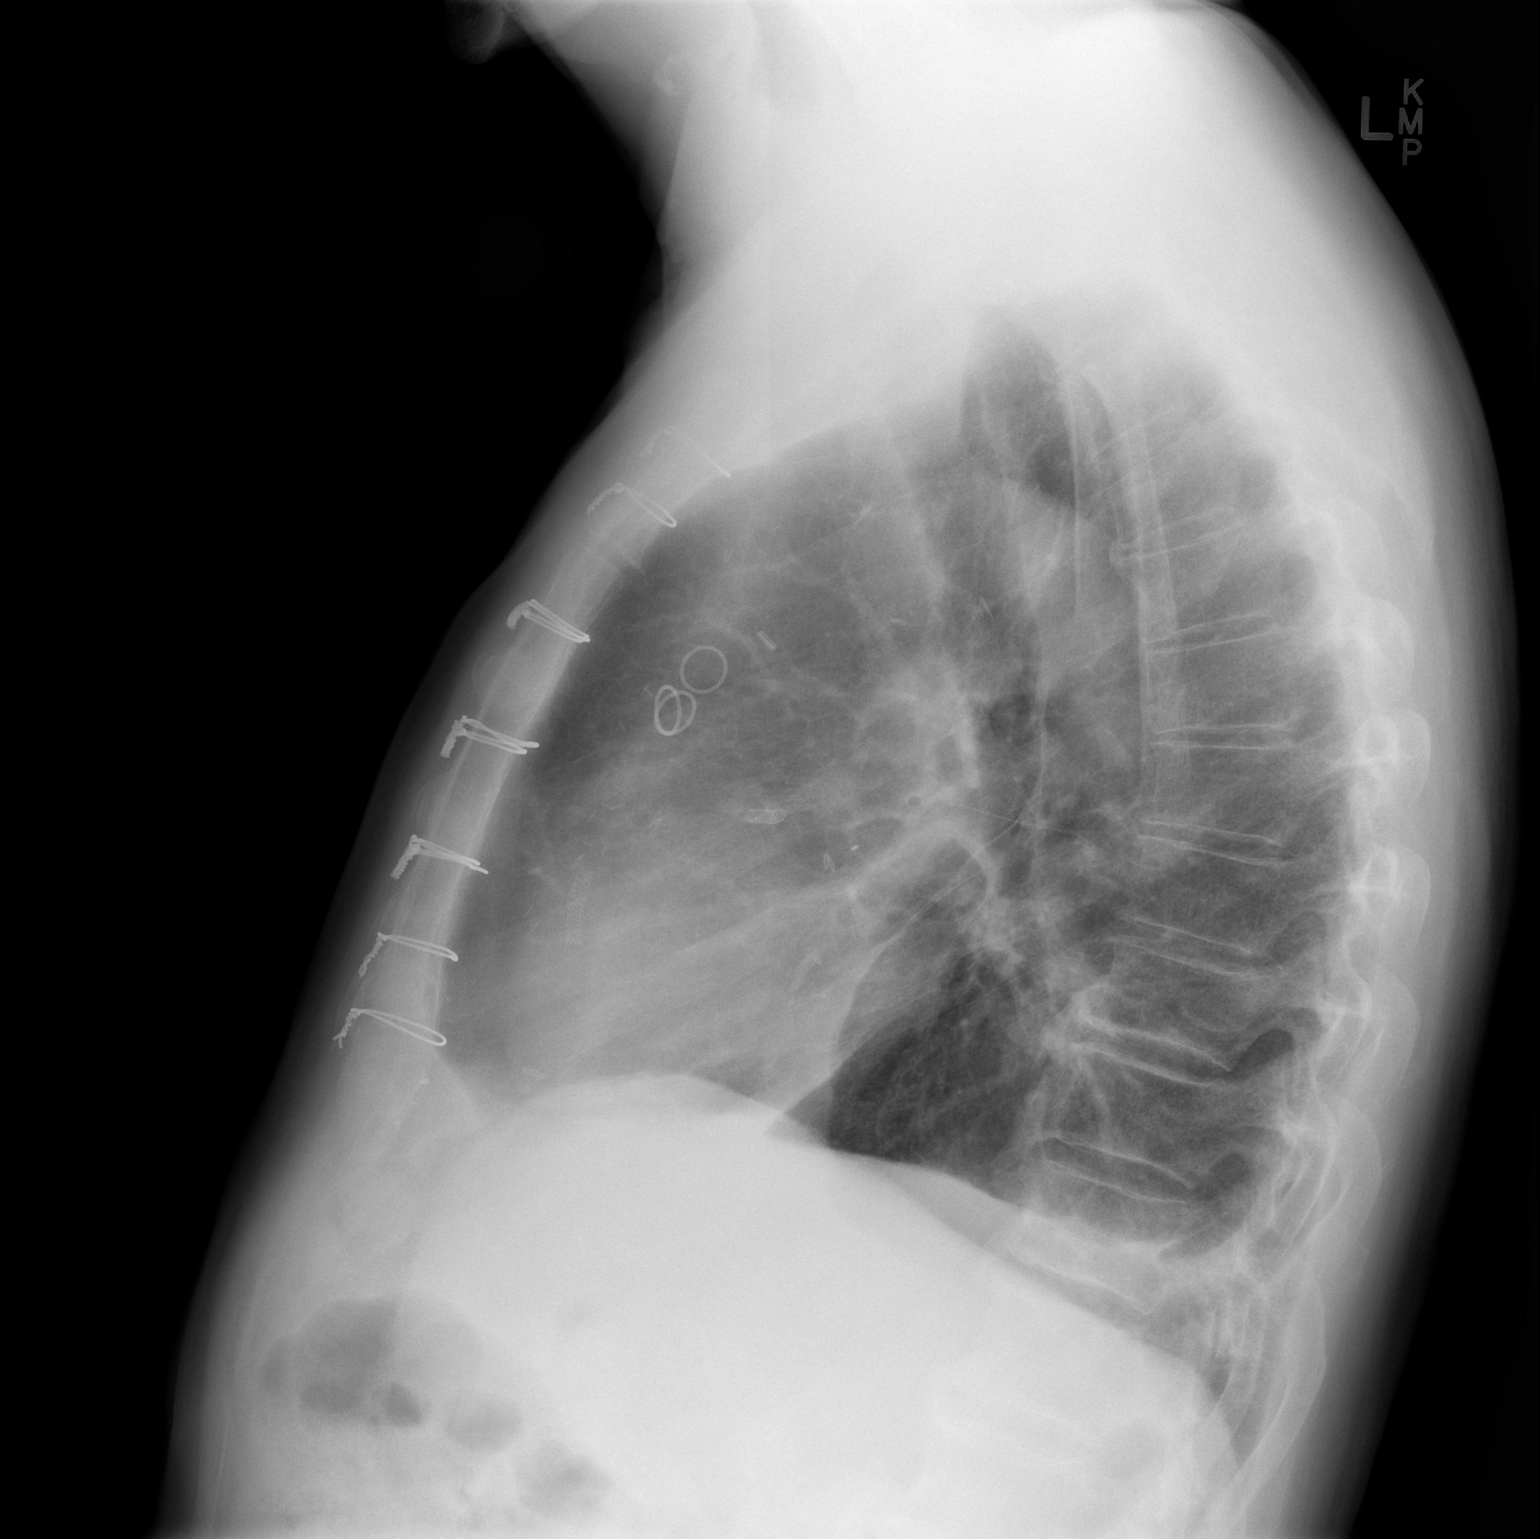

[2 of 2 positions shown; findings below may reference images not displayed]

FINDINGS: Postop CABG. Negative for heart failure. Small left pleural effusion
slightly improved but persistent.

Resolution of left apical pneumothorax since the prior study.

Small left pleural effusion has improved in the interval. Lungs are
now clear. Resolution of bibasilar atelectasis.

Resolution of left pneumothorax.
IMPRESSION: No active cardiopulmonary disease.

## 2017-03-08 DIAGNOSIS — M5134 Other intervertebral disc degeneration, thoracic region: Secondary | ICD-10-CM | POA: Diagnosis not present

## 2017-03-08 DIAGNOSIS — M9902 Segmental and somatic dysfunction of thoracic region: Secondary | ICD-10-CM | POA: Diagnosis not present

## 2017-03-15 ENCOUNTER — Other Ambulatory Visit: Payer: Self-pay

## 2017-03-15 ENCOUNTER — Encounter: Payer: Medicare Other | Attending: Physical Medicine & Rehabilitation | Admitting: Physical Medicine & Rehabilitation

## 2017-03-15 ENCOUNTER — Encounter: Payer: Self-pay | Admitting: Physical Medicine & Rehabilitation

## 2017-03-15 VITALS — BP 169/75 | HR 78 | Resp 14

## 2017-03-15 DIAGNOSIS — G588 Other specified mononeuropathies: Secondary | ICD-10-CM | POA: Diagnosis not present

## 2017-03-15 DIAGNOSIS — M4126 Other idiopathic scoliosis, lumbar region: Secondary | ICD-10-CM | POA: Diagnosis not present

## 2017-03-15 DIAGNOSIS — M47816 Spondylosis without myelopathy or radiculopathy, lumbar region: Secondary | ICD-10-CM

## 2017-03-15 DIAGNOSIS — E1142 Type 2 diabetes mellitus with diabetic polyneuropathy: Secondary | ICD-10-CM | POA: Insufficient documentation

## 2017-03-15 DIAGNOSIS — M545 Low back pain: Secondary | ICD-10-CM | POA: Insufficient documentation

## 2017-03-15 DIAGNOSIS — E785 Hyperlipidemia, unspecified: Secondary | ICD-10-CM | POA: Insufficient documentation

## 2017-03-15 DIAGNOSIS — K589 Irritable bowel syndrome without diarrhea: Secondary | ICD-10-CM | POA: Insufficient documentation

## 2017-03-15 DIAGNOSIS — E559 Vitamin D deficiency, unspecified: Secondary | ICD-10-CM | POA: Diagnosis not present

## 2017-03-15 DIAGNOSIS — I10 Essential (primary) hypertension: Secondary | ICD-10-CM | POA: Insufficient documentation

## 2017-03-15 DIAGNOSIS — Z79899 Other long term (current) drug therapy: Secondary | ICD-10-CM | POA: Diagnosis not present

## 2017-03-15 DIAGNOSIS — M4185 Other forms of scoliosis, thoracolumbar region: Secondary | ICD-10-CM | POA: Diagnosis not present

## 2017-03-15 DIAGNOSIS — R0789 Other chest pain: Secondary | ICD-10-CM | POA: Diagnosis not present

## 2017-03-15 DIAGNOSIS — G8929 Other chronic pain: Secondary | ICD-10-CM | POA: Insufficient documentation

## 2017-03-15 DIAGNOSIS — Z8249 Family history of ischemic heart disease and other diseases of the circulatory system: Secondary | ICD-10-CM | POA: Diagnosis not present

## 2017-03-15 DIAGNOSIS — Z955 Presence of coronary angioplasty implant and graft: Secondary | ICD-10-CM | POA: Diagnosis not present

## 2017-03-15 DIAGNOSIS — Z87891 Personal history of nicotine dependence: Secondary | ICD-10-CM | POA: Insufficient documentation

## 2017-03-15 DIAGNOSIS — K219 Gastro-esophageal reflux disease without esophagitis: Secondary | ICD-10-CM | POA: Diagnosis not present

## 2017-03-15 DIAGNOSIS — Z951 Presence of aortocoronary bypass graft: Secondary | ICD-10-CM | POA: Diagnosis not present

## 2017-03-15 DIAGNOSIS — I2510 Atherosclerotic heart disease of native coronary artery without angina pectoris: Secondary | ICD-10-CM | POA: Insufficient documentation

## 2017-03-15 MED ORDER — CARBAMAZEPINE 200 MG PO TABS: 200.0000 mg | ORAL_TABLET | Freq: Two times a day (BID) | ORAL | 5 refills | Status: DC

## 2017-03-15 NOTE — Progress Notes (Signed)
Subjective:    Patient ID: [name], male    DOB: Nov 15, 1933, 83 y.o.   MRN: [id number]  HPI   [name] is here in follow up of his chronic pain complaints. A month ago I injected his left 8th rib.  He states that the injection only provided relief for about a day.  Pain is in the left lower rib area.  He is found it more difficult to perform tasks at the house.  He is hardly getting into his work room.  He does manage to walk a bit.  He remains on gabapentin 100 mg 3 times daily but has had difficulties increasing the dose beyond that as it makes him dizzy and he loses his balance.  His right rib cage is nontender currently.  His low back aches but is somewhat tolerable at present.   Pain Inventory Average Pain 7 Pain Right Now 7 My pain is sharp and aching  In the last 24 hours, has pain interfered with the following? General activity 7 Relation with others 0 Enjoyment of life 10 What TIME of day is your pain at its worst? morning Sleep (in general) Fair  Pain is worse with: walking, bending, sitting, inactivity, standing and some activites Pain improves with: heat/ice and medication Relief from Meds: 2  Mobility walk with assistance use a cane how many minutes can you walk? 5 ability to climb steps?  yes do you drive?  yes transfers alone Do you have any goals in this area?  yes  Function retired  Neuro/Psych numbness tingling spasms dizziness  Prior Studies Any changes since last visit?  no  Physicians involved in your care Any changes since last visit?  no   Family History  Problem Relation Age of Onset  . Heart attack Father        died age 6  . Heart attack Brother        died age 33  . Heart attack Sister        died age 84  . Colon cancer Sister   . Liver cancer Sister   . Diabetes Maternal Grandmother   . Colon polyps Sister        and brothers x 2    Social History   Socioeconomic History  . Marital status: Married   Spouse name: None  . Number of children: 4   . Years of education: None  . Highest education level: None  Social Needs  . Financial resource strain: None  . Food insecurity - worry: None  . Food insecurity - inability: None  . Transportation needs - medical: None  . Transportation needs - non-medical: None  Occupational History  . Occupation: Retired  Tobacco Use  . Smoking status: Former Smoker    Last attempt to quit: 07/16/1963    Years since quitting: 53.7  . Smokeless tobacco: Never Used  Substance and Sexual Activity  . Alcohol use: No  . Drug use: No  . Sexual activity: Not Currently  Other Topics Concern  . None  Social History Narrative   1 caffeine drink daily    Past Surgical History:  Procedure Laterality Date  . BLADDER SURGERY  1969   traumatic pelvic fractures, urethral and bladder repair  . CORONARY ANGIOPLASTY WITH STENT PLACEMENT    . KNEE SURGERY     Past Medical History:  Diagnosis Date  . 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 Colon polyps   . Coronary atherosclerosis of  unspecified type of vessel, native or graft   . Difficult intubation   . Esophageal reflux   . Hypertension   . IBS (irritable bowel syndrome)   . Orthostatic hypotension    "BP has been dropping alot when I stand up for the last month or so" (02/17/2016)  . Vitamin B 12 deficiency   . Vitamin D deficiency    BP (!) 169/75 (BP Location: Left Arm, Patient Position: Sitting, Cuff Size: Normal)   Pulse 78   Resp 14   SpO2 97%   Opioid Risk Score:   Fall Risk Score:  `1  Depression screen PHQ 2/9  Depression screen [facility] 2/9 11/29/2016 03/22/2016 02/09/2016 10/29/2015 09/23/2015 08/11/2015 05/14/2015  Decreased Interest 0 3 0 0 0 0 0  Down, Depressed, Hopeless 0 1 0 0 0 0 0  PHQ - 2 Score 0 4 0 0 0 0 0  Altered sleeping - 2 - - - - -  Tired, decreased energy - 3 - - - - -  Change in appetite - 1 - - - - -  Feeling bad or failure about yourself  - 2 - - - - -  Trouble concentrating  - 2 - - - - -  Moving slowly or fidgety/restless - 1 - - - - -  Suicidal thoughts - 0 - - - - -  PHQ-9 Score - 15 - - - - -  Difficult doing work/chores - Extremely dIfficult - - - - -   2 Review of Systems  Constitutional: Positive for diaphoresis.  HENT: Negative.   Eyes: Negative.   Respiratory: Negative.   Gastrointestinal: Positive for constipation.  Endocrine: Negative.        High blood sugar  Genitourinary: Negative.   Musculoskeletal: Positive for gait problem.       Spasms  Skin: Negative.   Allergic/Immunologic: Negative.   Neurological: Positive for numbness.       Neuralgia tingling  Hematological: Negative.   Psychiatric/Behavioral: Negative.        Objective:   Physical Exam  General: Alert and oriented x 3, No apparent distress HEENT:Head is normocephalic, atraumatic, PERRLA, EOMI, sclera anicteric, oral mucosa pink and moist, dentition intact, ext ear canals clear,  Neck:Supple without JVD or lymphadenopathy Heart:Normal rate Chest:Normal effort Abdomen:Soft, non-tender, non-distended, bowel sounds positive. Extremities:No clubbing, cyanosis, or edema. Pulses are 2+ Skin:Clean and intact without signs of breakdown. CABG incision along sternum clean/intact Neuro:Pt is cognitively appropriate with normal insight, memory, and awareness. Cranial nerves 2-12 are intact. Decreased sensation to LT in both feet, does seem to sense pain.Reflexes are 2+ in all 4's. Fine motor coordination is intact. No tremors. Motor function is grossly 5/5.  Musculoskeletal:He has tenderness more at the medial left 11th rib with palpation.  May be some tenderness as well as at the 10th rib.  There is no axial tenderness per se.  He does exhibit some pain with flexion of the mid back as well as rotation of his trunk.  Low back ROM stable.   Mild right knee tenderness today with crepitus..  Continued dextroscoliosis with apex around the L3 level unchanged. Marland Kitchen  Psych:pleasant  and appropriate         1. Chronic right chest wall pain, intercostal neuritis, ribs 6 and 7. Likely triggered by recent CABG in March. Pain is recurrent and pt also having tenderness on the left at 8-9 again---has responded to blocks well in past..  2. Chronic low back pain, thoracic levoscoliosis  and lumbar dextroscoliosis. Equivocal results with MBB's. critical DDD at L3-L5 and foraminal stenosis at these levels. Good results with recent right L4-5 T-LESI.  3. Diabetic polyneuropathy with ?autonomic dysfunction, hypotension 4. Right knee pain. Likely old meniscal injury/osteoarthritis-stable at present   Plan: 1.  Maintain basic exercise and home exercise program 2. Gabapentin 100mg  tid.  Wean to off over 1 week.  Get a trial of Tegretol 200 mg twice daily with titration explained to patient and wife today.. 3. Voltaren gel and lidocaine gel to back/ribs.  4. After informed consent and preparation of the skin with betadine and isopropyl alcohol, I injected 6mg  (1cc) of celestone and 4cc of 1% lidocaine along the left medial 11th rib via posterior approach. Additionally, aspiration was performed prior to injection. The patient tolerated well, and no complications were encountered. Afterward the area was cleaned and dressed. Post- injection instructions were provided.   5. Prn robaxin for muscle spasms. 6. Follow up with me in about 1 months. 75minutes of face to face patient care time were spent during this visit. All questions were encouraged and answered.

## 2017-03-15 NOTE — Patient Instructions (Addendum)
PLEASE FEEL FREE TO CALL OUR OFFICE WITH ANY PROBLEMS OR QUESTIONS ([phone])   Take the gabapentin daily for one week then stop. Follow directions bottle for tegretol.

## 2017-03-29 ENCOUNTER — Ambulatory Visit: Payer: Self-pay | Admitting: Physician Assistant

## 2017-04-03 NOTE — Progress Notes (Deleted)
FOLLOW UP  Assessment and Plan:   Dysautonomia orthostatic hypotension syndrome Well controlled with current medications  Monitor blood pressure at home; patient to call if consistently greater than 130/80 or with very low readings below 90/60 Reminder to go to the ER if any CP, SOB, nausea, dizziness, severe HA, changes vision/speech, left arm numbness and tingling and jaw pain.  Cholesterol Continue flax seed supplement - currently close to goal with lifestyle changes  Continue low cholesterol diet and exercise.  Check lipid panel.   Diabetes with diabetic polyneuropathy Continue diet and exercise.  Perform daily foot/skin check, notify office of any concerning changes.  Check A1C  Obesity with co morbidities Long discussion about weight loss, diet, and exercise Discussed ideal weight for height (below ***) and initial weight goal (***) Patient will work on *** Will follow up in 3 months  Vitamin D Def/ osteoporosis prevention Continue supplementation Check Vit D level  Continue diet and meds as discussed. Further disposition pending results of labs. Discussed med's effects and SE's.   Over 30 minutes of exam, counseling, chart review, and critical decision making was performed.   Future Appointments  Date Time Provider [place]  04/04/2017 11:00 AM Liane Comber, NP [facility] None  04/12/2017 10:20 AM Meredith Staggers, MD [facility]  06/30/2017  9:30 AM [name], MD [facility] None  12/12/2017  9:00 AM [name], MD [facility] None    ----------------------------------------------------------------------------------------------------------------------  HPI 83 y.o. male  presents for 3 month follow up on BP (dx with dysautonomia orthostatic hypotension syndrome), cholesterol, T2 diabetes currently controlled by diet, weight and vitamin D deficiency.   BMI is There is no height or weight on file to calculate BMI., he {HAS HAS QBH:41937} been  working on diet and exercise. Wt Readings from Last 3 Encounters:  11/29/16 200 lb (90.7 kg)  06/08/16 203 lb 9.6 oz (92.4 kg)  05/31/16 204 lb 3.2 oz (92.6 kg)   His blood pressure {HAS HAS NOT:18834} been controlled at home, today their BP is    He {DOES_DOES TKW:40973} workout. He denies chest pain, shortness of breath, dizziness.   He is not on cholesterol medication secondary to mild elevation and age- on flax seed supplement and lifestyle modification. His cholesterol is not at goal. The cholesterol last visit was:   Lab Results  Component Value Date   CHOL 168 11/29/2016   HDL 35 (L) 11/29/2016   LDLCALC 110 (H) 11/29/2016   TRIG 113 11/29/2016   CHOLHDL 4.8 11/29/2016    He has been working on diet and exercise for prediabetes (previously diabetic- controlled by lifestyle), and denies {Symptoms; diabetes w/o none:19199}. Last A1C in the office was:  Lab Results  Component Value Date   HGBA1C 6.3 (H) 11/29/2016   Patient is on Vitamin D supplement and was above goal at the last visit:    Lab Results  Component Value Date   VD25OH 117 (H) 11/29/2016        Current Medications:  Current Outpatient Medications on File Prior to Visit  Medication Sig  . aspirin EC 81 MG EC tablet Take 1 tablet (81 mg total) by mouth daily.  . butalbital-acetaminophen-caffeine (FIORICET, ESGIC) 50-325-40 MG tablet Take 1-2 tablets by mouth every 6 (six) hours as needed for headache.  Marland Kitchen CALCIUM PO Take 1 tablet by mouth daily.  . carbamazepine (TEGRETOL) 200 MG tablet Take 1 tablet (200 mg total) 2 (two) times daily by mouth. Take once at night for 4 days then  twice daily thereafter  . Cholecalciferol (VITAMIN D PO) Take 5,000 Units by mouth 2 (two) times daily.  . cyanocobalamin 1000 MCG tablet Take 1,000 mcg by mouth daily.  . diclofenac sodium (VOLTAREN) 1 % GEL Apply 1 application topically 3 (three) times daily. Ribs, back. (Patient taking differently: Apply 1 application topically daily.  Ribs, back.)  . Flaxseed, Linseed, 1000 MG CAPS Take 1,300 mg by mouth daily.   . fludrocortisone (FLORINEF) 0.1 MG tablet TAKE 2 TABLETS(200 MCG) BY MOUTH DAILY  . Magnesium 250 MG TABS Take 500 mg by mouth daily.   . multivitamin (THERAGRAN) per tablet Take 1 tablet by mouth daily.    Marland Kitchen omeprazole (PRILOSEC) 20 MG capsule TAKE 2 CAPSULES BY MOUTH EVERY DAY FOR INDIGESTION  . traMADol (ULTRAM) 50 MG tablet Take 1 tablet (50 mg total) by mouth every 12 (twelve) hours as needed.   No current facility-administered medications on file prior to visit.      Allergies:  Allergies  Allergen Reactions  . Cymbalta [Duloxetine Hcl]     Dizziness  . Keflex [Cephalexin] Other (See Comments)    Reaction: unknown   . Simvastatin Other (See Comments)    Reaction: unknown   . Sudafed [Pseudoephedrine]     Dizziness     Medical History:  Past Medical History:  Diagnosis Date  . 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 Colon polyps   . Coronary atherosclerosis of unspecified type of vessel, native or graft   . Difficult intubation   . Esophageal reflux   . Hypertension   . IBS (irritable bowel syndrome)   . Orthostatic hypotension    "BP has been dropping alot when I stand up for the last month or so" (02/17/2016)  . Vitamin B 12 deficiency   . Vitamin D deficiency    Family history- Reviewed and unchanged Social history- Reviewed and unchanged   Review of Systems:  ROS    Physical Exam: There were no vitals taken for this visit. Wt Readings from Last 3 Encounters:  11/29/16 200 lb (90.7 kg)  06/08/16 203 lb 9.6 oz (92.4 kg)  05/31/16 204 lb 3.2 oz (92.6 kg)   General Appearance: Well nourished, in no apparent distress. Eyes: PERRLA, EOMs, conjunctiva no swelling or erythema Sinuses: No Frontal/maxillary tenderness ENT/Mouth: Ext aud canals clear, TMs without erythema, bulging. No erythema, swelling, or exudate on post pharynx.  Tonsils not swollen or erythematous. Hearing normal.   Neck: Supple, thyroid normal.  Respiratory: Respiratory effort normal, BS equal bilaterally without rales, rhonchi, wheezing or stridor.  Cardio: RRR with no MRGs. Brisk peripheral pulses without edema.  Abdomen: Soft, + BS.  Non tender, no guarding, rebound, hernias, masses. Lymphatics: Non tender without lymphadenopathy.  Musculoskeletal: Full ROM, 5/5 strength, {PSY - GAIT AND STATION:22860} gait Skin: Warm, dry without rashes, lesions, ecchymosis.  Neuro: Cranial nerves intact. No cerebellar symptoms.  Psych: Awake and oriented X 3, normal affect, Insight and Judgment appropriate.    Izora Ribas, NP 1:35 PM [facility]

## 2017-04-04 ENCOUNTER — Ambulatory Visit: Payer: Medicare Other | Admitting: Adult Health

## 2017-04-04 ENCOUNTER — Encounter: Payer: Self-pay | Admitting: Adult Health

## 2017-04-04 VITALS — BP 158/90 | HR 68 | Temp 97.1°F | Wt 219.0 lb

## 2017-04-04 DIAGNOSIS — I25810 Atherosclerosis of coronary artery bypass graft(s) without angina pectoris: Secondary | ICD-10-CM

## 2017-04-04 DIAGNOSIS — I739 Peripheral vascular disease, unspecified: Secondary | ICD-10-CM | POA: Diagnosis not present

## 2017-04-04 DIAGNOSIS — Z79899 Other long term (current) drug therapy: Secondary | ICD-10-CM | POA: Diagnosis not present

## 2017-04-04 DIAGNOSIS — E538 Deficiency of other specified B group vitamins: Secondary | ICD-10-CM

## 2017-04-04 DIAGNOSIS — E119 Type 2 diabetes mellitus without complications: Secondary | ICD-10-CM

## 2017-04-04 DIAGNOSIS — R6889 Other general symptoms and signs: Secondary | ICD-10-CM | POA: Diagnosis not present

## 2017-04-04 DIAGNOSIS — E1342 Other specified diabetes mellitus with diabetic polyneuropathy: Secondary | ICD-10-CM

## 2017-04-04 DIAGNOSIS — E559 Vitamin D deficiency, unspecified: Secondary | ICD-10-CM | POA: Diagnosis not present

## 2017-04-04 DIAGNOSIS — I714 Abdominal aortic aneurysm, without rupture, unspecified: Secondary | ICD-10-CM

## 2017-04-04 DIAGNOSIS — N183 Chronic kidney disease, stage 3 unspecified: Secondary | ICD-10-CM

## 2017-04-04 DIAGNOSIS — I951 Orthostatic hypotension: Secondary | ICD-10-CM

## 2017-04-04 DIAGNOSIS — E782 Mixed hyperlipidemia: Secondary | ICD-10-CM

## 2017-04-04 DIAGNOSIS — Z0001 Encounter for general adult medical examination with abnormal findings: Secondary | ICD-10-CM

## 2017-04-04 DIAGNOSIS — K219 Gastro-esophageal reflux disease without esophagitis: Secondary | ICD-10-CM

## 2017-04-04 DIAGNOSIS — Z951 Presence of aortocoronary bypass graft: Secondary | ICD-10-CM

## 2017-04-04 DIAGNOSIS — M4126 Other idiopathic scoliosis, lumbar region: Secondary | ICD-10-CM

## 2017-04-04 DIAGNOSIS — M5136 Other intervertebral disc degeneration, lumbar region: Secondary | ICD-10-CM

## 2017-04-04 DIAGNOSIS — M51369 Other intervertebral disc degeneration, lumbar region without mention of lumbar back pain or lower extremity pain: Secondary | ICD-10-CM

## 2017-04-04 DIAGNOSIS — G903 Multi-system degeneration of the autonomic nervous system: Secondary | ICD-10-CM | POA: Diagnosis not present

## 2017-04-04 DIAGNOSIS — Z0000 Encounter for general adult medical examination without abnormal findings: Secondary | ICD-10-CM

## 2017-04-04 NOTE — Progress Notes (Signed)
MEDICARE ANNUAL WELLNESS VISIT AND FOLLOW UP Assessment:   [name] was seen today for follow-up and medicare wellness.  Diagnoses and all orders for this visit:  Diabetes mellitus type 2, diet-controlled ([place]) Has been controlled in prediabetic range with lifstyle Continue diet and exercise.  Perform daily foot/skin check, notify office of any concerning changes.  -     Hemoglobin A1c  CKD (chronic kidney disease), stage III (HCC) Increase fluids, avoid NSAIDS, monitor sugars, will monitor -     BASIC METABOLIC PANEL WITH GFR  Vitamin D deficiency Continue supplementation, monitor routinely -     VITAMIN D 25 Hydroxy (Vit-D Deficiency, Fractures)  Mixed hyperlipidemia Fair control with lifestyle and flax seed supplement - reportedly did not tolerate medications and was discontinued Continue low cholesterol diet and exercise.  Check lipid panel.  -     Lipid panel -     TSH  Medication management -     CBC with Differential/Platelet -     BASIC METABOLIC PANEL WITH GFR -     Hepatic function panel  Dysautonomia orthostatic hypotension syndrome (HCC) Well controlled with current medications at home - florinef Monitor blood pressure at home - standing BPs only; patient to call if consistently greater than 130/80 or with very low readings below 90/60 Reminder to go to the ER if any CP, SOB, nausea, dizziness, severe HA, changes vision/speech, left arm numbness and tingling and jaw pain.  Gastroesophageal reflux disease, esophagitis presence not specified Well managed on current medications Discussed diet, avoiding triggers and other lifestyle changes  Diabetic polyneuropathy associated with other specified diabetes mellitus ([place]) Continue close monitoring of blood glucose, patient checks skin/feet daily, plan in place for constipation  Abdominal aortic aneurysm (AAA) without rupture ([place]) Followed by cardiology and vascular   Coronary artery disease involving coronary  bypass graft of native heart without angina pectoris Continue follow up with cardiology Continue close monitoring of blood pressure, cholesterol, blood glucose - continue ASA  PVD (peripheral vascular disease) ([place]) Continue follow up with cardiology Continue close monitoring of blood pressure, cholesterol, blood glucose - continue ASA  Other idiopathic scoliosis, lumbar region Continue f/u with pain management  Vitamin B 12 deficiency On supplementation; monitor B12 levels routinely Check CBC  S/P CABG x 4 Continue follow up cardiology  DDD (degenerative disc disease), lumbar Continue follow up spine/pain management  Over 30 minutes of exam, counseling, chart review, and critical decision making was performed  Future Appointments  Date Time Provider [place]  04/12/2017 10:20 AM Meredith Staggers, MD [facility] CPR  07/06/2017 11:15 AM [name], MD [facility] None  12/12/2017  9:00 AM [name], MD [facility] None     Plan:   During the course of the visit the patient was educated and counseled about appropriate screening and preventive services including:    Pneumococcal vaccine   Influenza vaccine  Prevnar 13  Td vaccine  Screening electrocardiogram  Colorectal cancer screening  Diabetes screening  Glaucoma screening  Nutrition counseling    Subjective:  [name] is a 83 y.o. male who presents for Medicare Annual Wellness Visit and 3 month follow up for HTN, hyperlipidemia, type 2 diabetes, and vitamin D Def. He continues to follow up with pain management and chiropractor for ongoing back pain; he has an appointment to follow up to discuss further imaging/MRI for his lumbar spine. He is s/p CABG x 4 with extensive cardiovascular history - continues to follow up with cardiology.  His blood pressure has been controlled at home (130s/70s), today their BP is BP: (!) 158/90 He does workout. He denies chest pain, shortness of  breath, dizziness.   He is not on cholesterol medication due to age and SE. His cholesterol is not at goal. The cholesterol last visit was:   Lab Results  Component Value Date   CHOL 168 11/29/2016   HDL 35 (L) 11/29/2016   LDLCALC 110 (H) 11/29/2016   TRIG 113 11/29/2016   CHOLHDL 4.8 11/29/2016   He has been working on diet and exercise for T2 diabetes, and denies increased appetite, nausea, polydipsia, polyuria, visual disturbances and vomiting. Last A1C in the office was:  Lab Results  Component Value Date   HGBA1C 6.3 (H) 11/29/2016   Last GFR Lab Results  Component Value Date   GFRNONAA 52 (L) 01/19/2017    Patient is on Vitamin D supplement and above goal:    Lab Results  Component Value Date   VD25OH 117 (H) 11/29/2016      Medication Review: Current Outpatient Medications on File Prior to Visit  Medication Sig Dispense Refill  . aspirin EC 81 MG EC tablet Take 1 tablet (81 mg total) by mouth daily.    . butalbital-acetaminophen-caffeine (FIORICET, ESGIC) 50-325-40 MG tablet Take 1-2 tablets by mouth every 6 (six) hours as needed for headache. 60 tablet 0  . CALCIUM PO Take 1 tablet by mouth daily.    . Cholecalciferol (VITAMIN D PO) Take 5,000 Units by mouth 2 (two) times daily.    . cyanocobalamin 1000 MCG tablet Take 1,000 mcg by mouth daily.    . diclofenac sodium (VOLTAREN) 1 % GEL Apply 1 application topically 3 (three) times daily. Ribs, back. (Patient taking differently: Apply 1 application topically daily. Ribs, back.) 3 Tube 4  . Flaxseed, Linseed, 1000 MG CAPS Take 1,300 mg by mouth daily.     . fludrocortisone (FLORINEF) 0.1 MG tablet TAKE 2 TABLETS(200 MCG) BY MOUTH DAILY 180 tablet 1  . gabapentin (NEURONTIN) 100 MG capsule Take 100 mg by mouth 2 (two) times daily. Take one tablet in the morning and one tablet in the evening    . Magnesium 250 MG TABS Take 500 mg by mouth daily.     . multivitamin (THERAGRAN) per tablet Take 1 tablet by mouth daily.       Marland Kitchen omeprazole (PRILOSEC) 20 MG capsule TAKE 2 CAPSULES BY MOUTH EVERY DAY FOR INDIGESTION 180 capsule 0  . carbamazepine (TEGRETOL) 200 MG tablet Take 1 tablet (200 mg total) 2 (two) times daily by mouth. Take once at night for 4 days then twice daily thereafter (Patient not taking: Reported on 04/04/2017) 60 tablet 5  . traMADol (ULTRAM) 50 MG tablet Take 1 tablet (50 mg total) by mouth every 12 (twelve) hours as needed. (Patient not taking: Reported on 04/04/2017) 30 tablet 1   No current facility-administered medications on file prior to visit.     Allergies: Allergies  Allergen Reactions  . Cymbalta [Duloxetine Hcl]     Dizziness  . Keflex [Cephalexin] Other (See Comments)    Reaction: unknown   . Simvastatin Other (See Comments)    Reaction: unknown   . Sudafed [Pseudoephedrine]     Dizziness    Current Problems (verified) has Coronary atherosclerosis- s/p PCI to LAD in 2009 and PCI to RCA in 2011; Abdominal aortic aneurysm ([place]); GERD; History of IBS; Supine hypertension; Vitamin D deficiency; Vitamin B 12 deficiency; Mixed hyperlipidemia; PVD (peripheral vascular  disease) ([place]); BMI 25.0-25.9,adult; S/P CABG x 4; Orthostatic hypotension; Medication management; Intercostal neuralgia; Coronary artery disease involving coronary bypass graft of native heart without angina pectoris; Diabetes mellitus type 2, diet-controlled ([place]); CKD (chronic kidney disease), stage III ([place]); Dysautonomia orthostatic hypotension syndrome ([place]); Idiopathic scoliosis; Diabetic neuropathy ([place]); Lumbar facet arthropathy; DDD (degenerative disc disease), lumbar; and Primary osteoarthritis of right knee on their problem list.  Screening Tests Immunization History  Administered Date(s) Administered  . Influenza, High Dose Seasonal PF 12/30/2013, 02/02/2015, 01/27/2016  . Influenza-Unspecified 01/13/2013, 01/29/2017  . Pneumococcal Conjugate-13 02/14/2014  . Pneumococcal-Unspecified 06/15/2004  . Tetanus  10/01/2012  . Zoster 10/01/2012   Preventative care: Last colonoscopy: 2012  Prior vaccinations: TD or Tdap: 2014         Influenza: 2018           Pneumococcal: 2006 Prevnar13: 2015 Shingles/Zostavax: 2014  Last eye exam: [facility] - 06/2016  Last dentist exam: Remote - nearly full partials  Patient Care Team: [name], MD as PCP - General Rosalyn Gess Doreene Burke, PA-C as Physician Assistant (Cardiology) Lelon Perla, MD as Consulting Physician (Cardiology) Irene Shipper, MD as Consulting Physician (Gastroenterology)  Surgical: He  has a past surgical history that includes Knee surgery; Bladder surgery (1969); Coronary angioplasty with stent; Cardiac catheterization (N/A, 07/01/2015); Coronary artery bypass graft (N/A, 07/06/2015); and TEE without cardioversion (N/A, 07/06/2015). Family His family history includes Colon cancer in his sister; Colon polyps in his sister; Diabetes in his maternal grandmother; Heart attack in his brother, father, and sister; Liver cancer in his sister. Social history  He reports that he quit smoking about 53 years ago. he has never used smokeless tobacco. He reports that he does not drink alcohol or use drugs.  MEDICARE WELLNESS OBJECTIVES: Physical activity: Current Exercise Habits: Home exercise routine, Type of exercise: strength training/weights, Time (Minutes): 20, Frequency (Times/Week): 7, Weekly Exercise (Minutes/Week): 140, Intensity: Mild, Exercise limited by: orthopedic condition(s) Cardiac risk factors: Cardiac Risk Factors include: advanced age (>37men, >24 women);dyslipidemia;hypertension;male gender;smoking/ tobacco exposure Depression/mood screen:   Depression screen [facility] 2/9 04/04/2017  Decreased Interest 0  Down, Depressed, Hopeless 0  PHQ - 2 Score 0  Altered sleeping -  Tired, decreased energy -  Change in appetite -  Feeling bad or failure about yourself  -  Trouble concentrating -  Moving slowly or fidgety/restless  -  Suicidal thoughts -  PHQ-9 Score -  Difficult doing work/chores -    ADLs:  In your present state of health, do you have any difficulty performing the following activities: 04/04/2017 11/29/2016  Hearing? N N  Vision? N N  Difficulty concentrating or making decisions? N N  Walking or climbing stairs? N N  Dressing or bathing? N N  Doing errands, shopping? N N  Some recent data might be hidden     Cognitive Testing  Alert? Yes  Normal Appearance?Yes  Oriented to person? Yes  Place? Yes   Time? Yes  Recall of three objects?  Yes  Can perform simple calculations? Yes  Displays appropriate judgment?Yes  Can read the correct time from a watch face?Yes  EOL planning: Does Patient Have a Medical Advance Directive?: Yes Type of Advance Directive: Healthcare Power of Attorney, Living will Does patient want to make changes to medical advance directive?: No - Patient declined Copy of [place] in Chart?: Yes   Objective:   Today's Vitals   04/04/17 1059  BP: (!) 158/90  Pulse: 68  Temp: (!) 97.1 F (36.2 C)  SpO2: 98%  Weight: 219 lb (99.3 kg)   Body mass index is 30.12 kg/m.  General appearance: alert, no distress, WD/WN, male HEENT: normocephalic, sclerae anicteric, TMs pearly, nares patent, no discharge or erythema, pharynx normal Oral cavity: MMM, no lesions Neck: supple, no lymphadenopathy, no thyromegaly, no masses Heart: RRR, normal S1, S2, no murmurs Lungs: CTA bilaterally, no wheezes, rhonchi, or rales Abdomen: +bs, soft, non tender, non distended, no masses, no hepatomegaly, no splenomegaly Musculoskeletal: nontender, no swelling, no obvious deformity Extremities: no edema, no cyanosis, no clubbing Pulses: 1+ symmetric, upper and lower extremities, normal cap refill Neurological: alert, oriented x 3, CN2-12 intact, strength normal upper extremities and lower extremities, sensation significantly decreased to monofilament testing bilaterally  senses 2-3/10 points, DTRs 2+ throughout, no cerebellar signs, gait slow normal with cane Psychiatric: normal affect, behavior normal, pleasant   Medicare Attestation I have personally reviewed: The patient's medical and social history Their use of alcohol, tobacco or illicit drugs Their current medications and supplements The patient's functional ability including ADLs,fall risks, home safety risks, cognitive, and hearing and visual impairment Diet and physical activities Evidence for depression or mood disorders  The patient's weight, height, BMI, and visual acuity have been recorded in the chart.  I have made referrals, counseling, and provided education to the patient based on review of the above and I have provided the patient with a written personalized care plan for preventive services.     Izora Ribas, NP   04/04/2017

## 2017-04-05 LAB — BASIC METABOLIC PANEL WITH GFR
BUN/Creatinine Ratio: 14 (calc) (ref 6–22)
BUN: 17 mg/dL (ref 7–25)
CO2: 31 mmol/L (ref 20–32)
Calcium: 9.2 mg/dL (ref 8.6–10.3)
Chloride: 104 mmol/L (ref 98–110)
Creat: 1.2 mg/dL — ABNORMAL HIGH (ref 0.70–1.11)
GFR, Est African American: 65 mL/min/{1.73_m2} (ref >=60)
GFR, Est Non African American: 56 mL/min/{1.73_m2} — ABNORMAL LOW (ref >=60)
Glucose, Bld: 105 mg/dL — ABNORMAL HIGH (ref 65–99)
Potassium: 4.5 mmol/L (ref 3.5–5.3)
Sodium: 142 mmol/L (ref 135–146)

## 2017-04-05 LAB — CBC WITH DIFFERENTIAL/PLATELET
Basophils Absolute: 43 cells/uL (ref 0–200)
Basophils Relative: 0.6 %
Eosinophils Absolute: 213 cells/uL (ref 15–500)
Eosinophils Relative: 3 %
HCT: 37.4 % — ABNORMAL LOW (ref 38.5–50.0)
Hemoglobin: 12.3 g/dL — ABNORMAL LOW (ref 13.2–17.1)
Lymphs Abs: 2286 cells/uL (ref 850–3900)
MCH: 29.2 pg (ref 27.0–33.0)
MCHC: 32.9 g/dL (ref 32.0–36.0)
MCV: 88.8 fL (ref 80.0–100.0)
MPV: 9.8 fL (ref 7.5–12.5)
Monocytes Relative: 6.7 %
Neutro Abs: 4083 cells/uL (ref 1500–7800)
Neutrophils Relative %: 57.5 %
Platelets: 246 10*3/uL (ref 140–400)
RBC: 4.21 10*6/uL (ref 4.20–5.80)
RDW: 14.3 % (ref 11.0–15.0)
Total Lymphocyte: 32.2 %
WBC mixed population: 476 cells/uL (ref 200–950)
WBC: 7.1 10*3/uL (ref 3.8–10.8)

## 2017-04-05 LAB — HEPATIC FUNCTION PANEL
AG Ratio: 1.3 (calc) (ref 1.0–2.5)
ALT: 10 U/L (ref 9–46)
AST: 18 U/L (ref 10–35)
Albumin: 3.9 g/dL (ref 3.6–5.1)
Alkaline phosphatase (APISO): 100 U/L (ref 40–115)
Bilirubin, Direct: 0.1 mg/dL (ref 0.0–0.2)
Globulin: 2.9 g/dL (calc) (ref 1.9–3.7)
Indirect Bilirubin: 0.3 mg/dL (calc) (ref 0.2–1.2)
Total Bilirubin: 0.4 mg/dL (ref 0.2–1.2)
Total Protein: 6.8 g/dL (ref 6.1–8.1)

## 2017-04-05 LAB — LIPID PANEL
Cholesterol: 206 mg/dL — ABNORMAL HIGH (ref <200)
HDL: 33 mg/dL — ABNORMAL LOW (ref >40)
LDL Cholesterol (Calc): 136 mg/dL (calc) — ABNORMAL HIGH
Non-HDL Cholesterol (Calc): 173 mg/dL (calc) — ABNORMAL HIGH (ref <130)
Total CHOL/HDL Ratio: 6.2 (calc) — ABNORMAL HIGH (ref <5.0)
Triglycerides: 229 mg/dL — ABNORMAL HIGH (ref <150)

## 2017-04-05 LAB — TSH: TSH: 3.46 mIU/L (ref 0.40–4.50)

## 2017-04-05 LAB — HEMOGLOBIN A1C
Hgb A1c MFr Bld: 6.1 % of total Hgb — ABNORMAL HIGH (ref <5.7)
Mean Plasma Glucose: 128 (calc)
eAG (mmol/L): 7.1 (calc)

## 2017-04-05 LAB — VITAMIN D 25 HYDROXY (VIT D DEFICIENCY, FRACTURES): Vit D, 25-Hydroxy: 54 ng/mL (ref 30–100)

## 2017-04-12 ENCOUNTER — Ambulatory Visit: Payer: Self-pay | Admitting: Physical Medicine & Rehabilitation

## 2017-04-12 ENCOUNTER — Encounter: Payer: Self-pay | Admitting: Physical Medicine & Rehabilitation

## 2017-04-12 ENCOUNTER — Other Ambulatory Visit: Payer: Self-pay

## 2017-04-12 ENCOUNTER — Encounter: Payer: Medicare Other | Attending: Physical Medicine & Rehabilitation | Admitting: Physical Medicine & Rehabilitation

## 2017-04-12 VITALS — BP 149/89 | HR 79

## 2017-04-12 DIAGNOSIS — I10 Essential (primary) hypertension: Secondary | ICD-10-CM | POA: Diagnosis not present

## 2017-04-12 DIAGNOSIS — R0789 Other chest pain: Secondary | ICD-10-CM | POA: Diagnosis not present

## 2017-04-12 DIAGNOSIS — E1142 Type 2 diabetes mellitus with diabetic polyneuropathy: Secondary | ICD-10-CM | POA: Diagnosis not present

## 2017-04-12 DIAGNOSIS — M5414 Radiculopathy, thoracic region: Secondary | ICD-10-CM | POA: Insufficient documentation

## 2017-04-12 DIAGNOSIS — Z79899 Other long term (current) drug therapy: Secondary | ICD-10-CM | POA: Diagnosis not present

## 2017-04-12 DIAGNOSIS — Z8249 Family history of ischemic heart disease and other diseases of the circulatory system: Secondary | ICD-10-CM | POA: Diagnosis not present

## 2017-04-12 DIAGNOSIS — Z955 Presence of coronary angioplasty implant and graft: Secondary | ICD-10-CM | POA: Insufficient documentation

## 2017-04-12 DIAGNOSIS — M47816 Spondylosis without myelopathy or radiculopathy, lumbar region: Secondary | ICD-10-CM | POA: Diagnosis not present

## 2017-04-12 DIAGNOSIS — M545 Low back pain: Secondary | ICD-10-CM | POA: Diagnosis not present

## 2017-04-12 DIAGNOSIS — E559 Vitamin D deficiency, unspecified: Secondary | ICD-10-CM | POA: Diagnosis not present

## 2017-04-12 DIAGNOSIS — Z87891 Personal history of nicotine dependence: Secondary | ICD-10-CM | POA: Diagnosis not present

## 2017-04-12 DIAGNOSIS — G588 Other specified mononeuropathies: Secondary | ICD-10-CM | POA: Diagnosis not present

## 2017-04-12 DIAGNOSIS — E785 Hyperlipidemia, unspecified: Secondary | ICD-10-CM | POA: Diagnosis not present

## 2017-04-12 DIAGNOSIS — I2510 Atherosclerotic heart disease of native coronary artery without angina pectoris: Secondary | ICD-10-CM | POA: Insufficient documentation

## 2017-04-12 DIAGNOSIS — G8929 Other chronic pain: Secondary | ICD-10-CM | POA: Insufficient documentation

## 2017-04-12 DIAGNOSIS — K589 Irritable bowel syndrome without diarrhea: Secondary | ICD-10-CM | POA: Insufficient documentation

## 2017-04-12 DIAGNOSIS — Z951 Presence of aortocoronary bypass graft: Secondary | ICD-10-CM | POA: Diagnosis not present

## 2017-04-12 DIAGNOSIS — K219 Gastro-esophageal reflux disease without esophagitis: Secondary | ICD-10-CM | POA: Diagnosis not present

## 2017-04-12 DIAGNOSIS — M4185 Other forms of scoliosis, thoracolumbar region: Secondary | ICD-10-CM | POA: Insufficient documentation

## 2017-04-12 NOTE — Progress Notes (Signed)
Subjective:    Patient ID: [name], male    DOB: 12-19-1933, 83 y.o.   MRN: [id number]  HPI   Patient is back regarding his chronic pain.  We performed another intercostal injection at last visit and the patient really had minimal results.  He continues to have pain from the mid back radiating around to the midline.  There is sensitive to touch.  He tried Tegretol for a neuropathic component of this pain and he was unable to tolerate.  He remains on gabapentin only 100 mg twice daily.  I asked him if he has ever had a rash of any sort in this area and he denies this.  To review his most recent lumbar MRI is as follows:  L2-3: Small broad-based disc bulge. Mild facet arthropathy and ligamentum flavum redundancy without canal stenosis or neural foraminal narrowing.  L3-4: Small broad-based disc bulge and endplate spurring. Mild facet arthropathy and ligamentum flavum redundancy. Mild canal stenosis though partial effacement LEFT lateral recess could affect the traversing LEFT L4 nerve. Moderate LEFT neural foraminal narrowing.  L4-5: 5 mm broad-based disc bulge asymmetric to the RIGHT may affect the exited RIGHT L4 nerve. Mild facet arthropathy and ligamentum flavum redundancy. Moderate canal stenosis. Moderate to severe RIGHT, severe LEFT neural foraminal narrowing.  L5-S1: 4 mm broad-based disc bulge. Mild facet arthropathy without canal stenosis. Moderate RIGHT, mild LEFT neural foraminal Narrowing.  He did have good results with the epidural injection performed over the summer.  Some of the low back pain is beginning to recur but does not bother him like the upper back pain is.  Pain Inventory Average Pain 6 Pain Right Now 6 My pain is sharp and stabbing  In the last 24 hours, has pain interfered with the following? General activity 8 Relation with others 5 Enjoyment of life 4 What TIME of day is your pain at its worst? morning Sleep (in general) Good  Pain  is worse with: unsure Pain improves with: heat/ice Relief from Meds: 1  Mobility use a cane how many minutes can you walk? 10 ability to climb steps?  yes do you drive?  yes  Function retired I need assistance with the following:  household duties and shopping  Neuro/Psych weakness numbness dizziness  Prior Studies Any changes since last visit?  no  Physicians involved in your care Any changes since last visit?  no   Family History  Problem Relation Age of Onset  . Heart attack Father        died age 50  . Heart attack Brother        died age 44  . Heart attack Sister        died age 66  . Colon cancer Sister   . Liver cancer Sister   . Diabetes Maternal Grandmother   . Colon polyps Sister        and brothers x 2    Social History   Socioeconomic History  . Marital status: Married    Spouse name: Not on file  . Number of children: 4   . Years of education: Not on file  . Highest education level: Not on file  Social Needs  . Financial resource strain: Not on file  . Food insecurity - worry: Not on file  . Food insecurity - inability: Not on file  . Transportation needs - medical: Not on file  . Transportation needs - non-medical: Not on file  Occupational History  . Occupation:  Retired  Tobacco Use  . Smoking status: Former Smoker    Last attempt to quit: 07/16/1963    Years since quitting: 53.7  . Smokeless tobacco: Never Used  Substance and Sexual Activity  . Alcohol use: No  . Drug use: No  . Sexual activity: Not Currently  Other Topics Concern  . Not on file  Social History Narrative   1 caffeine drink daily    Past Surgical History:  Procedure Laterality Date  . BLADDER SURGERY  1969   traumatic pelvic fractures, urethral and bladder repair  . CARDIAC CATHETERIZATION N/A 07/01/2015   Procedure: Left Heart Cath and Coronary Angiography;  Surgeon: Wellington Hampshire, MD;  Location: [facility];  Service: Cardiovascular;  Laterality: N/A;    . CORONARY ANGIOPLASTY WITH STENT PLACEMENT    . CORONARY ARTERY BYPASS GRAFT N/A 07/06/2015   Procedure: CORONARY ARTERY BYPASS GRAFTING (CABG)x 4   utilizing the left internal mammary artery and endoscopically harvested bilateral  sapheneous vein.;  Surgeon: Ivin Poot, MD;  Location: [facility];  Service: Open Heart Surgery;  Laterality: N/A;  . KNEE SURGERY    . TEE WITHOUT CARDIOVERSION N/A 07/06/2015   Procedure: TRANSESOPHAGEAL ECHOCARDIOGRAM (TEE);  Surgeon: Ivin Poot, MD;  Location: [facility];  Service: Open Heart Surgery;  Laterality: N/A;   Past Medical History:  Diagnosis Date  . 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 Colon polyps   . Coronary atherosclerosis of unspecified type of vessel, native or graft   . Difficult intubation   . Esophageal reflux   . Hypertension   . IBS (irritable bowel syndrome)   . Orthostatic hypotension    "BP has been dropping alot when I stand up for the last month or so" (02/17/2016)  . Vitamin B 12 deficiency   . Vitamin D deficiency    There were no vitals taken for this visit.  Opioid Risk Score:   Fall Risk Score:  `1  Depression screen PHQ 2/9  Depression screen [facility] 2/9 04/12/2017 04/04/2017 11/29/2016 03/22/2016 02/09/2016 10/29/2015 09/23/2015  Decreased Interest 0 0 0 3 0 0 0  Down, Depressed, Hopeless 0 0 0 1 0 0 0  PHQ - 2 Score 0 0 0 4 0 0 0  Altered sleeping - - - 2 - - -  Tired, decreased energy - - - 3 - - -  Change in appetite - - - 1 - - -  Feeling bad or failure about yourself  - - - 2 - - -  Trouble concentrating - - - 2 - - -  Moving slowly or fidgety/restless - - - 1 - - -  Suicidal thoughts - - - 0 - - -  PHQ-9 Score - - - 15 - - -  Difficult doing work/chores - - - Extremely dIfficult - - -      Review of Systems  Constitutional: Negative.   HENT: Negative.   Eyes: Negative.   Respiratory: Negative.   Cardiovascular: Negative.   Gastrointestinal: Positive for nausea.  Endocrine: Negative.   Genitourinary:  Negative.   Musculoskeletal: Negative.   Skin: Negative.   Allergic/Immunologic: Negative.   Neurological: Negative.   Hematological: Negative.   Psychiatric/Behavioral: Negative.        Objective:   Physical Exam  General: Alert and oriented x 3, No apparent distress HEENT:Head is normocephalic, atraumatic, PERRLA, EOMI, sclera anicteric, oral mucosa pink and moist, dentition intact, ext ear canals clear,  Neck:Supple without JVD  or lymphadenopathy Heart:Normal rate Chest:Normal effort Abdomen:Soft, non-tender, non-distended, bowel sounds positive. Extremities:No clubbing, cyanosis, or edema. Pulses are 2+ Skin:Clean and intact without signs of breakdown. CABG incision along sternum clean/intact Neuro:Pt is cognitively appropriate with normal insight, memory, and awareness. Cranial nerves 2-12 are intact. Decreased sensation to LT bilateral lower extremities below the calves.Reflexes are 2+ in all 4's. Fine motor coordination is intact. No tremors. Motor function is grossly 5/5.  Musculoskeletal:He has ongoing tenderness along the left lower rib cage.  Skin is generally sensitive to touch along the T8-T9 dermatome on the left.  He has some tenderness with palpation over the lower lumbar spine which is worsened with prolonged sitting or standing.  He also has some pain with flexion and extension.ild right knee tenderness today with crepitus..  Continued dextroscoliosis with apex around the L3 level unchanged.Marland Kitchen  Psych:pleasant and appropriate         1. Chronic right chest wall pain, intercostal neuritis, ribs 6 and 7. Likely triggered by recent CABG in March. Pain is recurrent and pt also having tenderness on the left at 8-9again---has responded to blocks well in past but recently this has not been the case..  2. Chronic low back pain, thoracic levoscoliosis and lumbar dextroscoliosis. Equivocal results with MBB's. critical DDD at L3-L5 and foraminal stenosis at  these levels. Good results with recent right L4-5 T-LESI.  3. Diabetic polyneuropathy with ?autonomic dysfunction, hypotension 4. Right knee pain. Likely old meniscal injury/osteoarthritis-stable at present   Plan: 1.   Ordered thoracic spine MRI to better assess his spine at this level.  With his lack of response to intercostal blocks I suspect that his spine may ultimately be part of this problem. 2. Increase gabapentin 200mg  twice daily.  A gentle titration schedule was provided.  We will continue to try to increase this slowly tolerance. 3. Voltaren gel and lidocaine gel to back/ribs.  4. Stretching and home exercise program were reviewed 5.   He is off Robaxin currently. 6. Follow up with me in about6 weeks.  15 minutes of face to face patient care time were spent during this visit. All questions were encouraged and answered.

## 2017-04-12 NOTE — Patient Instructions (Addendum)
WEEK 1:  GABAPENTIN 100MG  IN AM AND 200MG  AT NIGHT  WEEK 2:   TAKE GABAPENTIN 200MG  TWICE DAILY   YOU CAN TAKE UP TO 2500MG  OF TYLENOL A DAY AS WELL.    PLEASE FEEL FREE TO CALL OUR OFFICE WITH ANY PROBLEMS OR QUESTIONS ([phone])  HAVE A HAPPY HOLIDAYS!                     ^                  ^^                ^ ^ ^             ^ ^ ^ ^ ^           ^ ^ ^ ^ ^ ^ ^        ^ ^ ^ ^ [place] Marland Kitchen                Marland Kitchen                Marland Kitchen                Marland Kitchen

## 2017-04-13 ENCOUNTER — Encounter: Payer: Self-pay | Admitting: *Deleted

## 2017-04-15 ENCOUNTER — Ambulatory Visit
Admission: RE | Admit: 2017-04-15 | Discharge: 2017-04-15 | Disposition: A | Discharge status: Home / Self Care | Payer: Medicare Other | Source: Ambulatory Visit | Attending: Physical Medicine & Rehabilitation | Admitting: Physical Medicine & Rehabilitation

## 2017-04-15 DIAGNOSIS — M546 Pain in thoracic spine: Secondary | ICD-10-CM | POA: Diagnosis not present

## 2017-04-15 DIAGNOSIS — M5414 Radiculopathy, thoracic region: Secondary | ICD-10-CM

## 2017-04-15 DIAGNOSIS — G588 Other specified mononeuropathies: Secondary | ICD-10-CM

## 2017-04-17 ENCOUNTER — Telehealth: Payer: Self-pay | Admitting: Physical Medicine & Rehabilitation

## 2017-04-17 NOTE — Telephone Encounter (Signed)
Please contact the patient and let him know I reviewed his MRI. There is an old T2 compression abnormality on the film which is likely an old fracture. It is unrelated to any of his pain. The rest of his thoracic spine, cord, and nerve roots look essentially normal. Continue with gabapentin titration as described at last visit.

## 2017-04-17 NOTE — Telephone Encounter (Signed)
Contacted patient and passed on Dr. Charm Barges medical advise. Patient verbalized understanding

## 2017-04-24 DIAGNOSIS — M5136 Other intervertebral disc degeneration, lumbar region: Secondary | ICD-10-CM | POA: Diagnosis not present

## 2017-04-24 DIAGNOSIS — M545 Low back pain: Secondary | ICD-10-CM | POA: Diagnosis not present

## 2017-04-24 DIAGNOSIS — G629 Polyneuropathy, unspecified: Secondary | ICD-10-CM | POA: Diagnosis not present

## 2017-04-27 DIAGNOSIS — M9902 Segmental and somatic dysfunction of thoracic region: Secondary | ICD-10-CM | POA: Diagnosis not present

## 2017-04-27 DIAGNOSIS — M5136 Other intervertebral disc degeneration, lumbar region: Secondary | ICD-10-CM | POA: Diagnosis not present

## 2017-04-27 DIAGNOSIS — M545 Low back pain: Secondary | ICD-10-CM | POA: Diagnosis not present

## 2017-04-27 DIAGNOSIS — E119 Type 2 diabetes mellitus without complications: Secondary | ICD-10-CM | POA: Diagnosis not present

## 2017-04-27 DIAGNOSIS — G629 Polyneuropathy, unspecified: Secondary | ICD-10-CM | POA: Diagnosis not present

## 2017-04-27 DIAGNOSIS — M5134 Other intervertebral disc degeneration, thoracic region: Secondary | ICD-10-CM | POA: Diagnosis not present

## 2017-04-28 ENCOUNTER — Telehealth: Payer: Self-pay | Admitting: *Deleted

## 2017-04-28 NOTE — Telephone Encounter (Signed)
Patient called a nd left a message asking for a call back.  I attempted to call back, no answer

## 2017-04-28 NOTE — Telephone Encounter (Signed)
I spoke with [name] and he went to Urgent Care on [address] [facility]).  They x rayed his back, gave him a prednisone dose pack (which he has just started yesterday 12/27) and gave him some hydrocodone.  He is hurting quite a bit.  I told him to complete the dosepack and continue the hydrocodone and I will send the message to Dr Naaman Plummer who will be back in the office Wednesday.  He can call us back Monday if dosepack has not helped. His appt is not until 05/24/17 with Naaman Plummer and unless there is a cancellation there is nothing earlier.  Please advise.

## 2017-05-02 ENCOUNTER — Other Ambulatory Visit: Payer: Self-pay | Admitting: Internal Medicine

## 2017-05-03 ENCOUNTER — Encounter: Payer: Self-pay | Admitting: Physical Medicine & Rehabilitation

## 2017-05-03 ENCOUNTER — Encounter: Payer: Medicare Other | Attending: Physical Medicine & Rehabilitation | Admitting: Physical Medicine & Rehabilitation

## 2017-05-03 VITALS — BP 144/78 | HR 83 | Resp 14

## 2017-05-03 DIAGNOSIS — K219 Gastro-esophageal reflux disease without esophagitis: Secondary | ICD-10-CM | POA: Diagnosis not present

## 2017-05-03 DIAGNOSIS — Z79899 Other long term (current) drug therapy: Secondary | ICD-10-CM | POA: Diagnosis not present

## 2017-05-03 DIAGNOSIS — E559 Vitamin D deficiency, unspecified: Secondary | ICD-10-CM | POA: Insufficient documentation

## 2017-05-03 DIAGNOSIS — M545 Low back pain: Secondary | ICD-10-CM | POA: Insufficient documentation

## 2017-05-03 DIAGNOSIS — E1142 Type 2 diabetes mellitus with diabetic polyneuropathy: Secondary | ICD-10-CM | POA: Diagnosis not present

## 2017-05-03 DIAGNOSIS — I2510 Atherosclerotic heart disease of native coronary artery without angina pectoris: Secondary | ICD-10-CM | POA: Insufficient documentation

## 2017-05-03 DIAGNOSIS — M5416 Radiculopathy, lumbar region: Secondary | ICD-10-CM | POA: Diagnosis not present

## 2017-05-03 DIAGNOSIS — I10 Essential (primary) hypertension: Secondary | ICD-10-CM | POA: Diagnosis not present

## 2017-05-03 DIAGNOSIS — E785 Hyperlipidemia, unspecified: Secondary | ICD-10-CM | POA: Insufficient documentation

## 2017-05-03 DIAGNOSIS — M5134 Other intervertebral disc degeneration, thoracic region: Secondary | ICD-10-CM | POA: Diagnosis not present

## 2017-05-03 DIAGNOSIS — Z955 Presence of coronary angioplasty implant and graft: Secondary | ICD-10-CM | POA: Diagnosis not present

## 2017-05-03 DIAGNOSIS — Z87891 Personal history of nicotine dependence: Secondary | ICD-10-CM | POA: Diagnosis not present

## 2017-05-03 DIAGNOSIS — G8929 Other chronic pain: Secondary | ICD-10-CM | POA: Insufficient documentation

## 2017-05-03 DIAGNOSIS — Z951 Presence of aortocoronary bypass graft: Secondary | ICD-10-CM | POA: Insufficient documentation

## 2017-05-03 DIAGNOSIS — G588 Other specified mononeuropathies: Secondary | ICD-10-CM | POA: Diagnosis not present

## 2017-05-03 DIAGNOSIS — K589 Irritable bowel syndrome without diarrhea: Secondary | ICD-10-CM | POA: Insufficient documentation

## 2017-05-03 DIAGNOSIS — M9902 Segmental and somatic dysfunction of thoracic region: Secondary | ICD-10-CM | POA: Diagnosis not present

## 2017-05-03 DIAGNOSIS — R0789 Other chest pain: Secondary | ICD-10-CM | POA: Insufficient documentation

## 2017-05-03 DIAGNOSIS — M4185 Other forms of scoliosis, thoracolumbar region: Secondary | ICD-10-CM | POA: Diagnosis not present

## 2017-05-03 DIAGNOSIS — M47816 Spondylosis without myelopathy or radiculopathy, lumbar region: Secondary | ICD-10-CM | POA: Diagnosis not present

## 2017-05-03 DIAGNOSIS — Z8249 Family history of ischemic heart disease and other diseases of the circulatory system: Secondary | ICD-10-CM | POA: Insufficient documentation

## 2017-05-03 NOTE — Patient Instructions (Signed)
PLEASE FEEL FREE TO CALL OUR OFFICE WITH ANY PROBLEMS OR QUESTIONS ([phone])      

## 2017-05-03 NOTE — Progress Notes (Signed)
Subjective:    Patient ID: [name], male    DOB: Dec 04, 1933, 84 y.o.   MRN: [id number]  HPI   [name] is here in follow-up of his chronic back pain.  At her last visit we increased his gabapentin which has helped his thoracic pain quite a bit.  Still is sore sometimes in the left upper back.  However over the last week and a half to to his low back pain has become severe.  He reports radiation of the pain from his low back and hip area on the right around to his right groin region.  He states there sometimes is some numbness along the right side of his back.  Standing makes the pain worse as well as walking.  He has some relief when he sits.  He does fine when he sleeps at night that the back will stiffen up and it is hard to get up out of bed and initially.  He was seen in urgent care and given some hydrocodone which has not helped a great deal.  He is doing some basic stretches at home.  I reviewed his most recent lumbar MRI which shows significant degenerative disc disease at L3-L4 as well as to a lesser extent L4-L5.  There is a broad-based disc herniation at L4-L5 with likely involvement of the bilateral L4 exiting nerve roots.     Pain Inventory Average Pain 7 Pain Right Now 7 My pain is sharp  In the last 24 hours, has pain interfered with the following? General activity 7 Relation with others 7 Enjoyment of life 7 What TIME of day is your pain at its worst? night Sleep (in general) Poor  Pain is worse with: standing Pain improves with: heat/ice Relief from Meds: 0  Mobility walk with assistance use a cane  Function retired  Neuro/Psych trouble walking  Prior Studies Any changes since last visit?  no  Physicians involved in your care Any changes since last visit?  no   Family History  Problem Relation Age of Onset  . Heart attack Father        died age 42  . Heart attack Brother        died age 21  . Heart attack Sister        died age 2  .  Colon cancer Sister   . Liver cancer Sister   . Diabetes Maternal Grandmother   . Colon polyps Sister        and brothers x 2    Social History   Socioeconomic History  . Marital status: Married    Spouse name: None  . Number of children: 4   . Years of education: None  . Highest education level: None  Social Needs  . Financial resource strain: None  . Food insecurity - worry: None  . Food insecurity - inability: None  . Transportation needs - medical: None  . Transportation needs - non-medical: None  Occupational History  . Occupation: Retired  Tobacco Use  . Smoking status: Former Smoker    Last attempt to quit: 07/16/1963    Years since quitting: 53.8  . Smokeless tobacco: Never Used  Substance and Sexual Activity  . Alcohol use: No  . Drug use: No  . Sexual activity: Not Currently  Other Topics Concern  . None  Social History Narrative   1 caffeine drink daily    Past Surgical History:  Procedure Laterality Date  . [place]  traumatic pelvic fractures, urethral and bladder repair  . CARDIAC CATHETERIZATION N/A 07/01/2015   Procedure: Left Heart Cath and Coronary Angiography;  Surgeon: Wellington Hampshire, MD;  Location: [facility];  Service: Cardiovascular;  Laterality: N/A;  . CORONARY ANGIOPLASTY WITH STENT PLACEMENT    . CORONARY ARTERY BYPASS GRAFT N/A 07/06/2015   Procedure: CORONARY ARTERY BYPASS GRAFTING (CABG)x 4   utilizing the left internal mammary artery and endoscopically harvested bilateral  sapheneous vein.;  Surgeon: Ivin Poot, MD;  Location: [facility];  Service: Open Heart Surgery;  Laterality: N/A;  . KNEE SURGERY    . TEE WITHOUT CARDIOVERSION N/A 07/06/2015   Procedure: TRANSESOPHAGEAL ECHOCARDIOGRAM (TEE);  Surgeon: Ivin Poot, MD;  Location: [facility];  Service: Open Heart Surgery;  Laterality: N/A;   Past Medical History:  Diagnosis Date  . 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 Colon polyps   . Coronary atherosclerosis of  unspecified type of vessel, native or graft   . Difficult intubation   . Esophageal reflux   . Hypertension   . IBS (irritable bowel syndrome)   . Orthostatic hypotension    "BP has been dropping alot when I stand up for the last month or so" (02/17/2016)  . Vitamin B 12 deficiency   . Vitamin D deficiency    BP (!) 144/78 (BP Location: Left Arm, Patient Position: Sitting, Cuff Size: Normal)   Pulse 83   Resp 14   SpO2 97%   Opioid Risk Score:   Fall Risk Score:  `1  Depression screen PHQ 2/9  Depression screen [facility] 2/9 04/12/2017 04/04/2017 11/29/2016 03/22/2016 02/09/2016 10/29/2015 09/23/2015  Decreased Interest 0 0 0 3 0 0 0  Down, Depressed, Hopeless 0 0 0 1 0 0 0  PHQ - 2 Score 0 0 0 4 0 0 0  Altered sleeping - - - 2 - - -  Tired, decreased energy - - - 3 - - -  Change in appetite - - - 1 - - -  Feeling bad or failure about yourself  - - - 2 - - -  Trouble concentrating - - - 2 - - -  Moving slowly or fidgety/restless - - - 1 - - -  Suicidal thoughts - - - 0 - - -  PHQ-9 Score - - - 15 - - -  Difficult doing work/chores - - - Extremely dIfficult - - -    Review of Systems  HENT: Negative.   Eyes: Negative.   Respiratory: Negative.   Cardiovascular: Negative.   Gastrointestinal: Negative.   Endocrine: Negative.   Genitourinary: Negative.   Musculoskeletal: Positive for back pain and gait problem.  Skin: Negative.   Allergic/Immunologic: Negative.   Neurological: Positive for dizziness, weakness, light-headedness and numbness.  Hematological: Negative.   Psychiatric/Behavioral: Negative.        Objective:   Physical Exam  General: Alert and oriented x 3, No apparent distress HEENT:Head is normocephalic, atraumatic, PERRLA, EOMI, sclera anicteric, oral mucosa pink and moist, dentition intact, ext ear canals clear,  Neck:Supple without JVD or lymphadenopathy Heart:Normal rate Chest:Normal effort Abdomen:Soft, non-tender, non-distended, bowel sounds  positive. Extremities:No clubbing, cyanosis, or edema. Pulses are 2+ Skin:Clean and intact without signs of breakdown. CABG incision along sternum clean/intact Neuro:Pt is cognitively appropriate with normal insight, memory, and awareness. Cranial nerves 2-12 are intact. Decreased sensation to LT bilateral lower extremities below the calves.Reflexes are 2+ in all 4's. Fine motor coordination is intact. No tremors.  Motor function remains 5/5.  Musculoskeletal:Less tenderness today lower along the bilateral thoracic regions.  He has palpation tenderness around the L3-L4 areas right more than left.  Mild associated muscle spasm in this area.  Lumbar flexion seem to improve the pain and he actually was able to bend to almost 90 degrees.  Extension and facet maneuvers exacerbated the symptoms as well as bending to the right.  Rotation cause some discomfort.  Straight leg raising was negative except for some pain in his hamstring area.    He does have an ongoing levoscoliosis of the lumbar spine with the apex around the L2-L1 level. Elevation of right hemipelvis.  Psych:pleasantand appropriate        1. Chronic right chest wall pain, intercostal neuritis, ribs 6 and 7. Likely triggered by recent CABG in March. Pain is recurrent and pt also having tenderness on the left at 8-9again---has responded partially to nerve blocks. 2. Chronic low back pain, thoracolumbar levoscoliosis.  Most recent lumbar MRI reveals critical DDD at L3-L5 and foraminal stenosis at these levels. Good results with recent right L4-5 T-LESI.  Had equivocal results with medial branch blocks in the past.  Presentation at this point is more consistent with a facet arthropathy however. 3. Diabetic polyneuropathy with ?autonomic dysfunction, hypotension 4. Right knee pain. Likely old meniscal injury/osteoarthritis-stable at present   Plan: 1. Given that he had better results with the epidural injection, will at least  initially plan on a repeat right L4-L5 trans-laminar epidural steroid injection.  Will discuss with Dr. Barbaraann Cao in the meantime regarding the possibility of a medial branch block instead.  In the interim I provided [name] facet stretches to do at home.  I asked him to stop these if they cause any increased pain.    2.   Maintain gabapentin 200mg  twice daily.    This seems to have helped his thoracic pain quite a bit 3. Voltaren gel and lidocaine gel to back/ribs.  4. Stretching and home exercise program were reviewed 5.  He is off Robaxin currently. 6. Follow up with me after injections.  15 minutes of face to face patient care time were spent during this visit. All questions were encouraged and answered.

## 2017-05-10 DIAGNOSIS — M5134 Other intervertebral disc degeneration, thoracic region: Secondary | ICD-10-CM | POA: Diagnosis not present

## 2017-05-10 DIAGNOSIS — M9902 Segmental and somatic dysfunction of thoracic region: Secondary | ICD-10-CM | POA: Diagnosis not present

## 2017-05-17 ENCOUNTER — Inpatient Hospital Stay (HOSPITAL_COMMUNITY)
Admission: EM | Admit: 2017-05-17 | Discharge: 2017-05-21 | DRG: 357 | Disposition: A | Discharge status: Home / Self Care | Payer: Medicare Other | Attending: Internal Medicine | Admitting: Internal Medicine

## 2017-05-17 ENCOUNTER — Other Ambulatory Visit: Payer: Self-pay

## 2017-05-17 ENCOUNTER — Inpatient Hospital Stay (HOSPITAL_COMMUNITY): Payer: Medicare Other

## 2017-05-17 ENCOUNTER — Inpatient Hospital Stay (HOSPITAL_COMMUNITY): Payer: Medicare Other | Admitting: Certified Registered Nurse Anesthetist

## 2017-05-17 ENCOUNTER — Emergency Department (HOSPITAL_COMMUNITY): Payer: Medicare Other

## 2017-05-17 ENCOUNTER — Encounter (HOSPITAL_COMMUNITY): Payer: Self-pay

## 2017-05-17 ENCOUNTER — Encounter (HOSPITAL_COMMUNITY)
Admission: EM | Disposition: A | Discharge status: Home / Self Care | Payer: Self-pay | Source: Home / Self Care | Attending: Family Medicine

## 2017-05-17 DIAGNOSIS — R1011 Right upper quadrant pain: Secondary | ICD-10-CM | POA: Diagnosis not present

## 2017-05-17 DIAGNOSIS — Z8249 Family history of ischemic heart disease and other diseases of the circulatory system: Secondary | ICD-10-CM | POA: Diagnosis not present

## 2017-05-17 DIAGNOSIS — R001 Bradycardia, unspecified: Secondary | ICD-10-CM | POA: Diagnosis present

## 2017-05-17 DIAGNOSIS — I10 Essential (primary) hypertension: Secondary | ICD-10-CM | POA: Diagnosis present

## 2017-05-17 DIAGNOSIS — I2510 Atherosclerotic heart disease of native coronary artery without angina pectoris: Secondary | ICD-10-CM | POA: Diagnosis present

## 2017-05-17 DIAGNOSIS — N179 Acute kidney failure, unspecified: Secondary | ICD-10-CM | POA: Diagnosis present

## 2017-05-17 DIAGNOSIS — Z955 Presence of coronary angioplasty implant and graft: Secondary | ICD-10-CM | POA: Diagnosis not present

## 2017-05-17 DIAGNOSIS — R933 Abnormal findings on diagnostic imaging of other parts of digestive tract: Secondary | ICD-10-CM | POA: Diagnosis not present

## 2017-05-17 DIAGNOSIS — Z7982 Long term (current) use of aspirin: Secondary | ICD-10-CM

## 2017-05-17 DIAGNOSIS — K5289 Other specified noninfective gastroenteritis and colitis: Secondary | ICD-10-CM | POA: Diagnosis not present

## 2017-05-17 DIAGNOSIS — Z7952 Long term (current) use of systemic steroids: Secondary | ICD-10-CM | POA: Diagnosis not present

## 2017-05-17 DIAGNOSIS — K529 Noninfective gastroenteritis and colitis, unspecified: Principal | ICD-10-CM | POA: Diagnosis present

## 2017-05-17 DIAGNOSIS — Z87891 Personal history of nicotine dependence: Secondary | ICD-10-CM

## 2017-05-17 DIAGNOSIS — R1084 Generalized abdominal pain: Secondary | ICD-10-CM | POA: Diagnosis not present

## 2017-05-17 DIAGNOSIS — E1140 Type 2 diabetes mellitus with diabetic neuropathy, unspecified: Secondary | ICD-10-CM | POA: Diagnosis present

## 2017-05-17 DIAGNOSIS — E559 Vitamin D deficiency, unspecified: Secondary | ICD-10-CM | POA: Diagnosis not present

## 2017-05-17 DIAGNOSIS — Z951 Presence of aortocoronary bypass graft: Secondary | ICD-10-CM | POA: Diagnosis not present

## 2017-05-17 DIAGNOSIS — K8020 Calculus of gallbladder without cholecystitis without obstruction: Secondary | ICD-10-CM | POA: Diagnosis not present

## 2017-05-17 DIAGNOSIS — Z800 Family history of malignant neoplasm of digestive organs: Secondary | ICD-10-CM

## 2017-05-17 DIAGNOSIS — E119 Type 2 diabetes mellitus without complications: Secondary | ICD-10-CM | POA: Diagnosis not present

## 2017-05-17 DIAGNOSIS — Z881 Allergy status to other antibiotic agents status: Secondary | ICD-10-CM | POA: Diagnosis not present

## 2017-05-17 DIAGNOSIS — K56609 Unspecified intestinal obstruction, unspecified as to partial versus complete obstruction: Secondary | ICD-10-CM | POA: Diagnosis not present

## 2017-05-17 DIAGNOSIS — K562 Volvulus: Secondary | ICD-10-CM | POA: Diagnosis not present

## 2017-05-17 DIAGNOSIS — I25110 Atherosclerotic heart disease of native coronary artery with unstable angina pectoris: Secondary | ICD-10-CM | POA: Diagnosis not present

## 2017-05-17 DIAGNOSIS — R1010 Upper abdominal pain, unspecified: Secondary | ICD-10-CM

## 2017-05-17 DIAGNOSIS — R9431 Abnormal electrocardiogram [ECG] [EKG]: Secondary | ICD-10-CM

## 2017-05-17 DIAGNOSIS — E538 Deficiency of other specified B group vitamins: Secondary | ICD-10-CM | POA: Diagnosis not present

## 2017-05-17 DIAGNOSIS — R112 Nausea with vomiting, unspecified: Secondary | ICD-10-CM | POA: Diagnosis not present

## 2017-05-17 DIAGNOSIS — E860 Dehydration: Secondary | ICD-10-CM | POA: Diagnosis not present

## 2017-05-17 DIAGNOSIS — K810 Acute cholecystitis: Secondary | ICD-10-CM

## 2017-05-17 DIAGNOSIS — K5909 Other constipation: Secondary | ICD-10-CM | POA: Diagnosis present

## 2017-05-17 DIAGNOSIS — I951 Orthostatic hypotension: Secondary | ICD-10-CM | POA: Diagnosis present

## 2017-05-17 DIAGNOSIS — R109 Unspecified abdominal pain: Secondary | ICD-10-CM | POA: Diagnosis not present

## 2017-05-17 DIAGNOSIS — Z888 Allergy status to other drugs, medicaments and biological substances status: Secondary | ICD-10-CM | POA: Diagnosis not present

## 2017-05-17 DIAGNOSIS — R110 Nausea: Secondary | ICD-10-CM | POA: Diagnosis not present

## 2017-05-17 DIAGNOSIS — K219 Gastro-esophageal reflux disease without esophagitis: Secondary | ICD-10-CM | POA: Diagnosis not present

## 2017-05-17 HISTORY — PX: COLON RESECTION: SHX5231

## 2017-05-17 LAB — CBC
HCT: 44 % (ref 39.0–52.0)
Hemoglobin: 14.7 g/dL (ref 13.0–17.0)
MCH: 30.6 pg (ref 26.0–34.0)
MCHC: 33.4 g/dL (ref 30.0–36.0)
MCV: 91.5 fL (ref 78.0–100.0)
Platelets: 156 10*3/uL (ref 150–400)
RBC: 4.81 MIL/uL (ref 4.22–5.81)
RDW: 15 % (ref 11.5–15.5)
WBC: 13.7 10*3/uL — ABNORMAL HIGH (ref 4.0–10.5)

## 2017-05-17 LAB — COMPREHENSIVE METABOLIC PANEL
ALT: 19 U/L (ref 17–63)
AST: 26 U/L (ref 15–41)
Albumin: 3.7 g/dL (ref 3.5–5.0)
Alkaline Phosphatase: 80 U/L (ref 38–126)
Anion gap: 13 (ref 5–15)
BUN: 29 mg/dL — ABNORMAL HIGH (ref 6–20)
CO2: 27 mmol/L (ref 22–32)
Calcium: 9.3 mg/dL (ref 8.9–10.3)
Chloride: 98 mmol/L — ABNORMAL LOW (ref 101–111)
Creatinine, Ser: 1.36 mg/dL — ABNORMAL HIGH (ref 0.61–1.24)
GFR calc Af Amer: 55 mL/min — ABNORMAL LOW (ref >60)
GFR calc non Af Amer: 47 mL/min — ABNORMAL LOW (ref >60)
Glucose, Bld: 175 mg/dL — ABNORMAL HIGH (ref 65–99)
Potassium: 3.8 mmol/L (ref 3.5–5.1)
Sodium: 138 mmol/L (ref 135–145)
Total Bilirubin: 0.9 mg/dL (ref 0.3–1.2)
Total Protein: 6.9 g/dL (ref 6.5–8.1)

## 2017-05-17 LAB — URINALYSIS, ROUTINE W REFLEX MICROSCOPIC
Bilirubin Urine: NEGATIVE
Glucose, UA: NEGATIVE mg/dL
Hgb urine dipstick: NEGATIVE
Ketones, ur: NEGATIVE mg/dL
Leukocytes, UA: NEGATIVE
Nitrite: NEGATIVE
Protein, ur: NEGATIVE mg/dL
Specific Gravity, Urine: 1.015 (ref 1.005–1.030)
pH: 7 (ref 5.0–8.0)

## 2017-05-17 LAB — TSH: TSH: 1.476 u[IU]/mL (ref 0.350–4.500)

## 2017-05-17 LAB — LIPASE, BLOOD: Lipase: 31 U/L (ref 11–51)

## 2017-05-17 LAB — GLUCOSE, CAPILLARY: Glucose-Capillary: 145 mg/dL — ABNORMAL HIGH (ref 65–99)

## 2017-05-17 LAB — ECHOCARDIOGRAM COMPLETE

## 2017-05-17 SURGERY — COLON RESECTION LAPAROSCOPIC
Anesthesia: General

## 2017-05-17 MED ORDER — PIPERACILLIN-TAZOBACTAM 3.375 G IVPB: 3.3750 g | Freq: Three times a day (TID) | INTRAVENOUS | Status: DC

## 2017-05-17 MED ORDER — HYDROMORPHONE HCL 1 MG/ML IJ SOLN: 0.2500 mg | INTRAMUSCULAR | Status: DC | PRN

## 2017-05-17 MED ORDER — BUPIVACAINE-EPINEPHRINE 0.5% -1:200000 IJ SOLN
INTRAMUSCULAR | Status: DC | PRN
  Administered 2017-05-17: 25 mL

## 2017-05-17 MED ORDER — BUPIVACAINE-EPINEPHRINE 0.5% -1:200000 IJ SOLN
INTRAMUSCULAR | Status: AC
  Filled 2017-05-17: qty 1

## 2017-05-17 MED ORDER — LIDOCAINE 2% (20 MG/ML) 5 ML SYRINGE
INTRAMUSCULAR | Status: AC
  Filled 2017-05-17: qty 5

## 2017-05-17 MED ORDER — SUCCINYLCHOLINE CHLORIDE 200 MG/10ML IV SOSY
PREFILLED_SYRINGE | INTRAVENOUS | Status: DC | PRN
  Administered 2017-05-17: 140 mg via INTRAVENOUS

## 2017-05-17 MED ORDER — FENTANYL CITRATE (PF) 100 MCG/2ML IJ SOLN
INTRAMUSCULAR | Status: DC | PRN
  Administered 2017-05-17 (×2): 50 ug via INTRAVENOUS

## 2017-05-17 MED ORDER — SUCCINYLCHOLINE CHLORIDE 200 MG/10ML IV SOSY
PREFILLED_SYRINGE | INTRAVENOUS | Status: AC
  Filled 2017-05-17: qty 10

## 2017-05-17 MED ORDER — ROCURONIUM BROMIDE 50 MG/5ML IV SOSY
PREFILLED_SYRINGE | INTRAVENOUS | Status: AC
  Filled 2017-05-17: qty 15

## 2017-05-17 MED ORDER — ACETAMINOPHEN 325 MG PO TABS: 650.0000 mg | ORAL_TABLET | Freq: Four times a day (QID) | ORAL | Status: DC | PRN

## 2017-05-17 MED ORDER — MAGNESIUM OXIDE 400 (241.3 MG) MG PO TABS
400.0000 mg | ORAL_TABLET | Freq: Every day | ORAL | Status: DC
  Administered 2017-05-17 – 2017-05-21 (×4): 400 mg via ORAL
  Filled 2017-05-17 (×4): qty 1

## 2017-05-17 MED ORDER — PROPOFOL 10 MG/ML IV BOLUS
INTRAVENOUS | Status: DC | PRN
  Administered 2017-05-17: 120 mg via INTRAVENOUS

## 2017-05-17 MED ORDER — ROCURONIUM BROMIDE 50 MG/5ML IV SOSY
PREFILLED_SYRINGE | INTRAVENOUS | Status: AC
  Filled 2017-05-17: qty 5

## 2017-05-17 MED ORDER — SODIUM CHLORIDE 0.9% FLUSH: 3.0000 mL | INTRAVENOUS | Status: DC | PRN

## 2017-05-17 MED ORDER — SUGAMMADEX SODIUM 200 MG/2ML IV SOLN
INTRAVENOUS | Status: DC | PRN
  Administered 2017-05-17: 200 mg via INTRAVENOUS

## 2017-05-17 MED ORDER — FENTANYL CITRATE (PF) 100 MCG/2ML IJ SOLN
INTRAMUSCULAR | Status: AC
  Administered 2017-05-17: 50 ug via INTRAVENOUS
  Filled 2017-05-17: qty 2

## 2017-05-17 MED ORDER — VITAMIN B-12 1000 MCG PO TABS
1000.0000 ug | ORAL_TABLET | Freq: Every day | ORAL | Status: DC
  Administered 2017-05-17 – 2017-05-21 (×3): 1000 ug via ORAL
  Filled 2017-05-17 (×3): qty 1

## 2017-05-17 MED ORDER — BUTALBITAL-APAP-CAFFEINE 50-325-40 MG PO TABS: 1.0000 | ORAL_TABLET | Freq: Four times a day (QID) | ORAL | Status: DC | PRN

## 2017-05-17 MED ORDER — ONDANSETRON HCL 4 MG/2ML IJ SOLN
INTRAMUSCULAR | Status: AC
  Filled 2017-05-17: qty 2

## 2017-05-17 MED ORDER — LACTATED RINGERS IV BOLUS (SEPSIS): 1000.0000 mL | Freq: Once | INTRAVENOUS | Status: DC

## 2017-05-17 MED ORDER — FLUDROCORTISONE ACETATE 0.1 MG PO TABS
0.1000 mg | ORAL_TABLET | Freq: Every day | ORAL | Status: DC
  Administered 2017-05-17: 0.1 mg via ORAL
  Filled 2017-05-17 (×2): qty 1

## 2017-05-17 MED ORDER — POLYETHYLENE GLYCOL 3350 17 G PO PACK
17.0000 g | PACK | Freq: Every day | ORAL | Status: DC | PRN
  Filled 2017-05-17: qty 1

## 2017-05-17 MED ORDER — ACETAMINOPHEN 10 MG/ML IV SOLN
1000.0000 mg | Freq: Four times a day (QID) | INTRAVENOUS | Status: DC
  Administered 2017-05-17: 1000 mg via INTRAVENOUS

## 2017-05-17 MED ORDER — BUPIVACAINE-EPINEPHRINE (PF) 0.5% -1:200000 IJ SOLN
INTRAMUSCULAR | Status: AC
  Filled 2017-05-17: qty 1.8

## 2017-05-17 MED ORDER — ALBUTEROL SULFATE (2.5 MG/3ML) 0.083% IN NEBU: 2.5000 mg | INHALATION_SOLUTION | RESPIRATORY_TRACT | Status: DC | PRN

## 2017-05-17 MED ORDER — PROCHLORPERAZINE 25 MG RE SUPP
25.0000 mg | Freq: Once | RECTAL | Status: AC
  Administered 2017-05-17: 25 mg via RECTAL
  Filled 2017-05-17: qty 1

## 2017-05-17 MED ORDER — IOPAMIDOL (ISOVUE-300) INJECTION 61%
INTRAVENOUS | Status: AC
  Filled 2017-05-17: qty 30

## 2017-05-17 MED ORDER — FENTANYL CITRATE (PF) 100 MCG/2ML IJ SOLN
50.0000 ug | Freq: Once | INTRAMUSCULAR | Status: AC
  Administered 2017-05-17: 50 ug via INTRAVENOUS

## 2017-05-17 MED ORDER — BUPIVACAINE LIPOSOME 1.3 % IJ SUSP
20.0000 mL | Freq: Once | INTRAMUSCULAR | Status: DC
  Filled 2017-05-17: qty 20

## 2017-05-17 MED ORDER — FENTANYL CITRATE (PF) 100 MCG/2ML IJ SOLN
INTRAMUSCULAR | Status: AC
  Filled 2017-05-17: qty 2

## 2017-05-17 MED ORDER — ADULT MULTIVITAMIN W/MINERALS CH
1.0000 | ORAL_TABLET | Freq: Every day | ORAL | Status: DC
  Administered 2017-05-17 – 2017-05-21 (×3): 1 via ORAL
  Filled 2017-05-17 (×3): qty 1

## 2017-05-17 MED ORDER — ONDANSETRON HCL 4 MG/2ML IJ SOLN
INTRAMUSCULAR | Status: DC | PRN
  Administered 2017-05-17: 4 mg via INTRAVENOUS

## 2017-05-17 MED ORDER — HEPARIN SODIUM (PORCINE) 5000 UNIT/ML IJ SOLN
5000.0000 [IU] | Freq: Three times a day (TID) | INTRAMUSCULAR | Status: DC
  Administered 2017-05-17 – 2017-05-21 (×10): 5000 [IU] via SUBCUTANEOUS
  Filled 2017-05-17 (×14): qty 1

## 2017-05-17 MED ORDER — OXYCODONE HCL 5 MG PO TABS
5.0000 mg | ORAL_TABLET | ORAL | Status: DC | PRN
  Administered 2017-05-19: 5 mg via ORAL
  Filled 2017-05-17: qty 1

## 2017-05-17 MED ORDER — ONDANSETRON HCL 4 MG/2ML IJ SOLN: 4.0000 mg | Freq: Four times a day (QID) | INTRAMUSCULAR | Status: DC | PRN

## 2017-05-17 MED ORDER — SODIUM CHLORIDE 0.9 % IV SOLN: 250.0000 mL | INTRAVENOUS | Status: DC | PRN

## 2017-05-17 MED ORDER — MORPHINE SULFATE (PF) 4 MG/ML IV SOLN
4.0000 mg | Freq: Once | INTRAVENOUS | Status: AC
  Administered 2017-05-17: 4 mg via INTRAVENOUS
  Filled 2017-05-17: qty 1

## 2017-05-17 MED ORDER — GABAPENTIN 100 MG PO CAPS
200.0000 mg | ORAL_CAPSULE | Freq: Two times a day (BID) | ORAL | Status: DC
  Administered 2017-05-17 – 2017-05-21 (×6): 200 mg via ORAL
  Filled 2017-05-17 (×6): qty 2

## 2017-05-17 MED ORDER — VITAMIN D3 25 MCG (1000 UNIT) PO TABS
5000.0000 [IU] | ORAL_TABLET | Freq: Two times a day (BID) | ORAL | Status: DC
Start: 2017-05-17 — End: 2017-05-21
  Administered 2017-05-17 – 2017-05-21 (×5): 5000 [IU] via ORAL
  Filled 2017-05-17 (×6): qty 5

## 2017-05-17 MED ORDER — PROPOFOL 10 MG/ML IV BOLUS
INTRAVENOUS | Status: AC
  Filled 2017-05-17: qty 20

## 2017-05-17 MED ORDER — MAGNESIUM 200 MG PO TABS
400.0000 mg | ORAL_TABLET | Freq: Every day | ORAL | Status: DC
  Filled 2017-05-17: qty 2

## 2017-05-17 MED ORDER — IOPAMIDOL (ISOVUE-M 300) INJECTION 61%: 15.0000 mL | Freq: Once | INTRAMUSCULAR | Status: DC | PRN

## 2017-05-17 MED ORDER — SENNA 8.6 MG PO TABS
1.0000 | ORAL_TABLET | Freq: Two times a day (BID) | ORAL | Status: DC
  Administered 2017-05-17 – 2017-05-21 (×6): 8.6 mg via ORAL
  Filled 2017-05-17 (×6): qty 1

## 2017-05-17 MED ORDER — ACETAMINOPHEN 650 MG RE SUPP: 650.0000 mg | Freq: Four times a day (QID) | RECTAL | Status: DC | PRN

## 2017-05-17 MED ORDER — ONDANSETRON HCL 4 MG PO TABS: 4.0000 mg | ORAL_TABLET | Freq: Four times a day (QID) | ORAL | Status: DC | PRN

## 2017-05-17 MED ORDER — HYDROMORPHONE HCL 1 MG/ML IJ SOLN
1.0000 mg | INTRAMUSCULAR | Status: DC | PRN
  Administered 2017-05-17 – 2017-05-19 (×2): 1 mg via INTRAVENOUS
  Filled 2017-05-17 (×2): qty 1

## 2017-05-17 MED ORDER — ONDANSETRON HCL 4 MG/2ML IJ SOLN
4.0000 mg | Freq: Once | INTRAMUSCULAR | Status: AC
  Administered 2017-05-17: 4 mg via INTRAVENOUS
  Filled 2017-05-17: qty 2

## 2017-05-17 MED ORDER — DEXAMETHASONE SODIUM PHOSPHATE 10 MG/ML IJ SOLN
INTRAMUSCULAR | Status: DC | PRN
  Administered 2017-05-17: 4 mg via INTRAVENOUS

## 2017-05-17 MED ORDER — DEXTROSE 5 % IV SOLN
INTRAVENOUS | Status: DC | PRN
  Administered 2017-05-17: 15 ug/min via INTRAVENOUS

## 2017-05-17 MED ORDER — ONDANSETRON HCL 4 MG/2ML IJ SOLN
4.0000 mg | Freq: Four times a day (QID) | INTRAMUSCULAR | Status: DC
  Administered 2017-05-17 – 2017-05-21 (×11): 4 mg via INTRAVENOUS
  Filled 2017-05-17 (×12): qty 2

## 2017-05-17 MED ORDER — ACETAMINOPHEN 10 MG/ML IV SOLN
INTRAVENOUS | Status: AC
  Administered 2017-05-17: 1000 mg via INTRAVENOUS
  Filled 2017-05-17: qty 100

## 2017-05-17 MED ORDER — LABETALOL HCL 5 MG/ML IV SOLN
5.0000 mg | INTRAVENOUS | Status: DC | PRN
  Administered 2017-05-17: 5 mg via INTRAVENOUS

## 2017-05-17 MED ORDER — PIPERACILLIN-TAZOBACTAM 3.375 G IVPB 30 MIN
3.3750 g | Freq: Once | INTRAVENOUS | Status: AC
  Administered 2017-05-17: 3.375 g via INTRAVENOUS
  Filled 2017-05-17: qty 50

## 2017-05-17 MED ORDER — LIDOCAINE 2% (20 MG/ML) 5 ML SYRINGE
INTRAMUSCULAR | Status: DC | PRN
  Administered 2017-05-17: 100 mg via INTRAVENOUS

## 2017-05-17 MED ORDER — TRAZODONE HCL 50 MG PO TABS
50.0000 mg | ORAL_TABLET | Freq: Every evening | ORAL | Status: DC | PRN
  Administered 2017-05-19: 50 mg via ORAL
  Filled 2017-05-17: qty 1

## 2017-05-17 MED ORDER — 0.9 % SODIUM CHLORIDE (POUR BTL) OPTIME
TOPICAL | Status: DC | PRN
  Administered 2017-05-17: 1000 mL

## 2017-05-17 MED ORDER — SUGAMMADEX SODIUM 200 MG/2ML IV SOLN
INTRAVENOUS | Status: AC
  Filled 2017-05-17: qty 2

## 2017-05-17 MED ORDER — PHENYLEPHRINE HCL 10 MG/ML IJ SOLN
INTRAMUSCULAR | Status: AC
  Filled 2017-05-17: qty 1

## 2017-05-17 MED ORDER — CLINDAMYCIN PHOSPHATE 900 MG/50ML IV SOLN
900.0000 mg | INTRAVENOUS | Status: AC
  Administered 2017-05-17: 900 mg via INTRAVENOUS
  Filled 2017-05-17: qty 50

## 2017-05-17 MED ORDER — PANTOPRAZOLE SODIUM 40 MG IV SOLR
40.0000 mg | Freq: Two times a day (BID) | INTRAVENOUS | Status: DC
  Administered 2017-05-17: 40 mg via INTRAVENOUS
  Filled 2017-05-17 (×2): qty 40

## 2017-05-17 MED ORDER — LACTATED RINGERS IV SOLN: INTRAVENOUS | Status: DC

## 2017-05-17 MED ORDER — CALCIUM CARBONATE ANTACID 500 MG PO CHEW
1.0000 | CHEWABLE_TABLET | Freq: Every day | ORAL | Status: DC
  Administered 2017-05-17 – 2017-05-21 (×3): 200 mg via ORAL
  Filled 2017-05-17 (×3): qty 1

## 2017-05-17 MED ORDER — SODIUM CHLORIDE 0.9 % IV SOLN
INTRAVENOUS | Status: DC
  Administered 2017-05-17 – 2017-05-18 (×3): via INTRAVENOUS

## 2017-05-17 MED ORDER — FAMOTIDINE IN NACL 20-0.9 MG/50ML-% IV SOLN
20.0000 mg | Freq: Two times a day (BID) | INTRAVENOUS | Status: DC
  Administered 2017-05-17 – 2017-05-19 (×5): 20 mg via INTRAVENOUS
  Filled 2017-05-17 (×6): qty 50

## 2017-05-17 MED ORDER — ASPIRIN EC 81 MG PO TBEC
81.0000 mg | DELAYED_RELEASE_TABLET | Freq: Every day | ORAL | Status: DC
  Administered 2017-05-17 – 2017-05-21 (×4): 81 mg via ORAL
  Filled 2017-05-17 (×4): qty 1

## 2017-05-17 MED ORDER — SODIUM CHLORIDE 0.9% FLUSH
3.0000 mL | Freq: Two times a day (BID) | INTRAVENOUS | Status: DC
  Administered 2017-05-17 – 2017-05-19 (×2): 3 mL via INTRAVENOUS

## 2017-05-17 MED ORDER — ROCURONIUM BROMIDE 10 MG/ML (PF) SYRINGE
PREFILLED_SYRINGE | INTRAVENOUS | Status: DC | PRN
  Administered 2017-05-17: 5 mg via INTRAVENOUS
  Administered 2017-05-17: 40 mg via INTRAVENOUS

## 2017-05-17 MED ORDER — LABETALOL HCL 5 MG/ML IV SOLN
INTRAVENOUS | Status: AC
  Administered 2017-05-17: 5 mg via INTRAVENOUS
  Filled 2017-05-17: qty 4

## 2017-05-17 MED ORDER — ONDANSETRON HCL 4 MG/2ML IJ SOLN
INTRAMUSCULAR | Status: AC
  Administered 2017-05-17: 4 mg
  Filled 2017-05-17: qty 2

## 2017-05-17 MED ORDER — LACTATED RINGERS IR SOLN
Status: DC | PRN
  Administered 2017-05-17: 1000 mL

## 2017-05-17 MED ORDER — MORPHINE SULFATE (PF) 4 MG/ML IV SOLN: 2.0000 mg | INTRAVENOUS | Status: DC | PRN

## 2017-05-17 MED ORDER — MORPHINE SULFATE (PF) 2 MG/ML IV SOLN
2.0000 mg | INTRAVENOUS | Status: DC | PRN
  Administered 2017-05-17: 2 mg via INTRAVENOUS
  Filled 2017-05-17: qty 1

## 2017-05-17 SURGICAL SUPPLY — 67 items
ADH SKN CLS APL DERMABOND .7 (GAUZE/BANDAGES/DRESSINGS)
APPLIER CLIP 5 13 M/L LIGAMAX5 (MISCELLANEOUS)
APR CLP MED LRG 5 ANG JAW (MISCELLANEOUS)
BLADE EXTENDED COATED 6.5IN (ELECTRODE) IMPLANT
CABLE HIGH FREQUENCY MONO STRZ (ELECTRODE) ×2 IMPLANT
CELLS DAT CNTRL 66122 CELL SVR (MISCELLANEOUS) IMPLANT
CHLORAPREP W/TINT 26ML (MISCELLANEOUS) ×2 IMPLANT
CLIP APPLIE 5 13 M/L LIGAMAX5 (MISCELLANEOUS) IMPLANT
DECANTER SPIKE VIAL GLASS SM (MISCELLANEOUS) ×2 IMPLANT
DERMABOND ADVANCED (GAUZE/BANDAGES/DRESSINGS)
DERMABOND ADVANCED .7 DNX12 (GAUZE/BANDAGES/DRESSINGS) ×1 IMPLANT
DRAIN CHANNEL 19F RND (DRAIN) IMPLANT
DRAPE LAPAROSCOPIC ABDOMINAL (DRAPES) ×2 IMPLANT
DRAPE SURG IRRIG POUCH 19X23 (DRAPES) ×1 IMPLANT
DRSG OPSITE POSTOP 4X10 (GAUZE/BANDAGES/DRESSINGS) IMPLANT
DRSG OPSITE POSTOP 4X6 (GAUZE/BANDAGES/DRESSINGS) IMPLANT
DRSG OPSITE POSTOP 4X8 (GAUZE/BANDAGES/DRESSINGS) IMPLANT
ELECT PENCIL ROCKER SW 15FT (MISCELLANEOUS) ×2 IMPLANT
ELECT REM PT RETURN 15FT ADLT (MISCELLANEOUS) ×2 IMPLANT
EVACUATOR SILICONE 100CC (DRAIN) IMPLANT
GAUZE SPONGE 4X4 12PLY STRL (GAUZE/BANDAGES/DRESSINGS) IMPLANT
GLOVE BIO SURGEON STRL SZ 6.5 (GLOVE) ×4 IMPLANT
GLOVE BIOGEL PI IND STRL 7.0 (GLOVE) ×2 IMPLANT
GLOVE BIOGEL PI INDICATOR 7.0 (GLOVE) ×2
GOWN STRL REUS W/TWL 2XL LVL3 (GOWN DISPOSABLE) ×4 IMPLANT
GOWN STRL REUS W/TWL XL LVL3 (GOWN DISPOSABLE) ×8 IMPLANT
GRASPER ENDOPATH ANVIL 10MM (MISCELLANEOUS) IMPLANT
HOLDER FOLEY CATH W/STRAP (MISCELLANEOUS) ×2 IMPLANT
IRRIG SUCT STRYKERFLOW 2 WTIP (MISCELLANEOUS) ×2
IRRIGATION SUCT STRKRFLW 2 WTP (MISCELLANEOUS) ×1 IMPLANT
LUBRICANT JELLY K Y 4OZ (MISCELLANEOUS) ×1 IMPLANT
PACK COLON (CUSTOM PROCEDURE TRAY) ×2 IMPLANT
PAD POSITIONING PINK XL (MISCELLANEOUS) ×2 IMPLANT
PORT LAP GEL ALEXIS MED 5-9CM (MISCELLANEOUS) ×1 IMPLANT
POSITIONER SURGICAL ARM (MISCELLANEOUS) ×2 IMPLANT
RETRACTOR WND ALEXIS 18 MED (MISCELLANEOUS) IMPLANT
RTRCTR WOUND ALEXIS 18CM MED (MISCELLANEOUS)
SCISSORS METZENBAUM CVD 33 (INSTRUMENTS) ×1 IMPLANT
SEALER TISSUE G2 STRG ARTC 35C (ENDOMECHANICALS) IMPLANT
SEALER TISSUE X1 CVD JAW (INSTRUMENTS) IMPLANT
SLEEVE ADV FIXATION 5X100MM (TROCAR) ×2 IMPLANT
SLEEVE XCEL OPT CAN 5 100 (ENDOMECHANICALS) ×1 IMPLANT
SPONGE DRAIN TRACH 4X4 STRL 2S (GAUZE/BANDAGES/DRESSINGS) IMPLANT
SPONGE LAP 18X18 X RAY DECT (DISPOSABLE) IMPLANT
STAPLER VISISTAT 35W (STAPLE) IMPLANT
SUT ETHILON 2 0 PS N (SUTURE) IMPLANT
SUT NOVA NAB GS-21 0 18 T12 DT (SUTURE) ×2 IMPLANT
SUT PDS AB 1 CTX 36 (SUTURE) IMPLANT
SUT PDS AB 1 TP1 96 (SUTURE) IMPLANT
SUT PROLENE 2 0 KS (SUTURE) ×1 IMPLANT
SUT SILK 2 0 (SUTURE) ×2
SUT SILK 2 0 SH CR/8 (SUTURE) ×2 IMPLANT
SUT SILK 2-0 18XBRD TIE 12 (SUTURE) ×1 IMPLANT
SUT SILK 3 0 (SUTURE) ×2
SUT SILK 3 0 SH CR/8 (SUTURE) ×2 IMPLANT
SUT SILK 3-0 18XBRD TIE 12 (SUTURE) ×1 IMPLANT
SUT VIC AB 2-0 SH 18 (SUTURE) ×2 IMPLANT
SUT VIC AB 4-0 PS2 27 (SUTURE) ×3 IMPLANT
SYS LAPSCP GELPORT 120MM (MISCELLANEOUS)
SYSTEM LAPSCP GELPORT 120MM (MISCELLANEOUS) IMPLANT
TOWEL OR NON WOVEN STRL DISP B (DISPOSABLE) ×2 IMPLANT
TRAY FOLEY W/METER SILVER 16FR (SET/KITS/TRAYS/PACK) IMPLANT
TROCAR BLADELESS OPT 5 100 (ENDOMECHANICALS) ×1 IMPLANT
TROCAR HASSON 5MM (TROCAR) ×1 IMPLANT
TROCAR XCEL BLUNT TIP 100MML (ENDOMECHANICALS) ×1 IMPLANT
TUBING CONNECTING 10 (TUBING) ×2 IMPLANT
TUBING INSUF HEATED (TUBING) ×2 IMPLANT

## 2017-05-17 NOTE — ED Notes (Signed)
Family at bedside. 

## 2017-05-17 NOTE — ED Notes (Signed)
Dr Marcello Moores Provider at bedside.

## 2017-05-17 NOTE — ED Provider Notes (Signed)
[facility] DEPT Provider Note   CSN: [id number] Arrival date & time: 05/17/17  0516     History   Chief Complaint Chief Complaint  Patient presents with  . Abdominal Pain    HPI [name] is a 84 y.o. male.  HPI Developed severe RUQ pain this morning. Pain is persistant and severe. Reports nausea without vomiting. Hx of abdominal surgery secondary to urologic trauma in the 1970s. Pain is severe. No fever or diarrhea. No other complaints  Past Medical History:  Diagnosis Date  . 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 Colon polyps   . Coronary atherosclerosis of unspecified type of vessel, native or graft   . Difficult intubation   . Esophageal reflux   . Hypertension   . IBS (irritable bowel syndrome)   . Orthostatic hypotension    "BP has been dropping alot when I stand up for the last month or so" (02/17/2016)  . Vitamin B 12 deficiency   . Vitamin D deficiency     Patient Active Problem List   Diagnosis Date Noted  . Thoracic radiculopathy 04/12/2017  . Primary osteoarthritis of right knee 12/14/2016  . DDD (degenerative disc disease), lumbar 10/10/2016  . Lumbar facet arthropathy 08/29/2016  . Idiopathic scoliosis 03/22/2016  . Diabetic neuropathy ([place]) 03/22/2016  . Dysautonomia orthostatic hypotension syndrome ([place]) 02/25/2016  . CKD (chronic kidney disease), stage III ([place]) 02/19/2016  . Coronary artery disease involving coronary bypass graft of native heart without angina pectoris   . Diabetes mellitus type 2, diet-controlled ([place])   . 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 Medication management 08/11/2015  . Orthostatic hypotension 07/24/2015  . 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 BMI 25.0-25.9,adult 02/02/2015  . PVD (peripheral vascular disease) ([place]) 01/01/2014  . Mixed hyperlipidemia 04/11/2013  . Supine hypertension   . Vitamin D deficiency   . Vitamin B 12 deficiency   . Coronary atherosclerosis- s/p PCI to LAD in 2009  and PCI to RCA in 2011 02/27/2009  . Abdominal aortic aneurysm ([place]) 02/27/2009  . GERD 02/27/2009  . History of IBS 02/27/2009    Past Surgical History:  Procedure Laterality Date  . BLADDER SURGERY  1969   traumatic pelvic fractures, urethral and bladder repair  . CARDIAC CATHETERIZATION N/A 07/01/2015   Procedure: Left Heart Cath and Coronary Angiography;  Surgeon: Wellington Hampshire, MD;  Location: [facility];  Service: Cardiovascular;  Laterality: N/A;  . CORONARY ANGIOPLASTY WITH STENT PLACEMENT    . CORONARY ARTERY BYPASS GRAFT N/A 07/06/2015   Procedure: CORONARY ARTERY BYPASS GRAFTING (CABG)x 4   utilizing the left internal mammary artery and endoscopically harvested bilateral  sapheneous vein.;  Surgeon: Ivin Poot, MD;  Location: [facility];  Service: Open Heart Surgery;  Laterality: N/A;  . KNEE SURGERY    . TEE WITHOUT CARDIOVERSION N/A 07/06/2015   Procedure: TRANSESOPHAGEAL ECHOCARDIOGRAM (TEE);  Surgeon: Ivin Poot, MD;  Location: [facility];  Service: Open Heart Surgery;  Laterality: N/A;       Home Medications    Prior to Admission medications   Medication Sig Start Date End Date Taking? Authorizing Provider  aspirin EC 81 MG EC tablet Take 1 tablet (81 mg total) by mouth daily. 07/11/15  Yes Gold, Wilder Glade, PA-C  butalbital-acetaminophen-caffeine [place], ESGIC) [phone] MG tablet Take 1-2 tablets by mouth every 6 (six) hours as needed for headache. 11/29/16 11/29/17 Yes [name], MD  CALCIUM PO Take 1 tablet by mouth  daily.   Yes [provider]  Cholecalciferol (VITAMIN D PO) Take 5,000 Units by mouth 2 (two) times daily.   Yes [provider]  cyanocobalamin 1000 MCG tablet Take 1,000 mcg by mouth daily.   Yes [provider]  diclofenac sodium (VOLTAREN) 1 % GEL Apply 1 application topically 3 (three) times daily. Ribs, back. Patient taking differently: Apply 1 application topically daily. Ribs, back. 05/03/16  Yes Meredith Staggers, MD  Flaxseed, Linseed, 1000 MG CAPS Take 1,300 mg by mouth daily.    Yes [provider]  gabapentin (NEURONTIN) 100 MG capsule Take 200 mg by mouth 2 (two) times daily. Take one tablet in the morning and one tablet in the evening    Yes [provider]  Magnesium 250 MG TABS Take 500 mg by mouth daily.    Yes [provider]  multivitamin [facility]) per tablet Take 1 tablet by mouth daily.     Yes [provider]  fludrocortisone (FLORINEF) 0.1 MG tablet TAKE 2 TABLETS(200 MCG) BY MOUTH DAILY 05/02/17   Vicie Mutters, PA-C  omeprazole (PRILOSEC) 20 MG capsule TAKE 2 CAPSULES BY MOUTH EVERY DAY FOR INDIGESTION Patient not taking: Reported on 05/17/2017 02/14/17   [name], MD    Family History Family History  Problem Relation Age of Onset  . Heart attack Father        died age 67  . Heart attack Brother        died age 51  . Heart attack Sister        died age 18  . Colon cancer Sister   . Liver cancer Sister   . Diabetes Maternal Grandmother   . Colon polyps Sister        and brothers x 2     Social History Social History   Tobacco Use  . Smoking status: Former Smoker    Last attempt to quit: 07/16/1963    Years since quitting: 53.8  . Smokeless tobacco: Never Used  Substance Use Topics  . Alcohol use: No  . Drug use: No     Allergies   Cymbalta [duloxetine hcl]; Keflex [cephalexin]; Simvastatin; and Sudafed [pseudoephedrine]   Review of Systems Review of Systems  All other systems reviewed and are negative.    Physical Exam Updated Vital Signs BP 123/66   Pulse (!) 52   Temp 97.6 F (36.4 C) (Oral)   Resp 14   SpO2 98%   Physical Exam  Constitutional: He is oriented to person, place, and time. He appears well-developed and well-nourished.  HENT:  Head: Normocephalic and atraumatic.  Eyes: EOM are normal.  Neck: Normal range of motion.  Cardiovascular: Normal rate, regular rhythm, normal heart  sounds and intact distal pulses.  Pulmonary/Chest: Effort normal and breath sounds normal. No respiratory distress.  Abdominal: Soft. He exhibits no distension. There is tenderness in the right upper quadrant.  Musculoskeletal: Normal range of motion.  Neurological: He is alert and oriented to person, place, and time.  Skin: Skin is warm and dry.  Psychiatric: He has a normal mood and affect. Judgment normal.  Nursing note and vitals reviewed.    ED Treatments / Results  Labs (all labs ordered are listed, but only abnormal results are displayed) Labs Reviewed  COMPREHENSIVE METABOLIC PANEL - Abnormal; Notable for the following components:      Result Value   Chloride 98 (*)    Glucose, Bld 175 (*)    BUN 29 (*)  Creatinine, Ser 1.36 (*)    GFR calc non Af Amer 47 (*)    GFR calc Af Amer 55 (*)    All other components within normal limits  CBC - Abnormal; Notable for the following components:   WBC 13.7 (*)    All other components within normal limits  URINALYSIS, ROUTINE W REFLEX MICROSCOPIC - Abnormal; Notable for the following components:   APPearance CLOUDY (*)    All other components within normal limits  LIPASE, BLOOD    EKG  EKG Interpretation  Date/Time:  Wednesday May 17 2017 05:54:07 EST Ventricular Rate:  49 PR Interval:    QRS Duration: 105 QT Interval:  466 QTC Calculation: 421 R Axis:   65 Text Interpretation:  Sinus bradycardia Otherwise normal ECG Confirmed by Veryl Speak [phone]) on 05/17/2017 6:03:09 AM       Radiology US Abdomen Limited Ruq  Result Date: 05/17/2017 CLINICAL DATA:  Acute abdominal pain, hypertension EXAM: ULTRASOUND ABDOMEN LIMITED RIGHT UPPER QUADRANT COMPARISON:  CT 08/14/2013 FINDINGS: Gallbladder: Gallstones measuring up to 0.7 cm. No gallbladder wall thickening or pericholecystic fluid. The gallbladder is incompletely distended. Common bile duct: Diameter: 4.4 mm, unremarkable Liver: No focal lesion identified. Within  normal limits in parenchymal echogenicity. Portal vein is patent on color Doppler imaging with normal direction of blood flow towards the liver. There is a trace amount of perihepatic ascites. IMPRESSION: 1. Cholelithiasis without other ultrasound evidence of cholecystitis or biliary obstruction. 2. Trace perihepatic ascites. Electronically Signed   By: Lucrezia Europe M.D.   On: 05/17/2017 09:34    Procedures Procedures (including critical care time)  Medications Ordered in ED Medications  morphine 4 MG/ML injection 4 mg (not administered)  ondansetron (ZOFRAN) 4 MG/2ML injection (4 mg  Given 05/17/17 0657)  fentaNYL (SUBLIMAZE) injection 50 mcg (50 mcg Intravenous Given 05/17/17 0705)  piperacillin-tazobactam (ZOSYN) IVPB 3.375 g (3.375 g Intravenous New Bag/Given 05/17/17 0843)  morphine 4 MG/ML injection 4 mg (4 mg Intravenous Given 05/17/17 0843)     Initial Impression / Assessment and Plan / ED Course  I have reviewed the triage vital signs and the nursing notes.  Pertinent labs & imaging results that were available during my care of the patient were reviewed by me and considered in my medical decision making (see chart for details).    RUQ tenderness on exam. Gallstones. IV zosyn for concern for clinical findings of acute cholecystitis. General surgery consultation  Final Clinical Impressions(s) / ED Diagnoses   Final diagnoses:  Upper abdominal pain  Acute cholecystitis    ED Discharge Orders    None       Jola Schmidt, MD 05/17/17 1007

## 2017-05-17 NOTE — Consult Note (Signed)
Reason for Consult:  RUQ pain Referring Physician:  [name] is an 84 y.o. male.  HPI: 84 y/o with acute onset of RUQ pain this AM, nausea without vomiting.  Work up in the ED shows he is afebrile, BP down on admit, but better now.glucose 175, creatinine is 1.36, lipase is 31, LFT's are normal.  WBC is 13.7.  RUQ ultrasound shows gallstones up to 0.7 CM, no GB wall thickening, or pericholecystic fluid, GB is not distended. CBD 4.4 mm.  Cholelithiasis without other ultrasound evidence of cholecystitis or biliary obstruction.  Trace perihepatic ascites.  We are ask t see.  Past Medical History:  Diagnosis Date  . 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 Colon polyps   . Coronary atherosclerosis of unspecified type of vessel, native or graft   . Difficult intubation   . Esophageal reflux   . Hypertension   . IBS (irritable bowel syndrome)   . Orthostatic hypotension    "BP has been dropping alot when I stand up for the last month or so" (02/17/2016)  . Vitamin B 12 deficiency   . Vitamin D deficiency     Past Surgical History:  Procedure Laterality Date  . BLADDER SURGERY  1969   traumatic pelvic fractures, urethral and bladder repair  . CARDIAC CATHETERIZATION N/A 07/01/2015   Procedure: Left Heart Cath and Coronary Angiography;  Surgeon: Dennis Hampshire, MD;  Location: [place] [facility];  Service: Cardiovascular;  Laterality: N/A;  . CORONARY ANGIOPLASTY WITH STENT PLACEMENT    . CORONARY ARTERY BYPASS GRAFT N/A 07/06/2015   Procedure: CORONARY ARTERY BYPASS GRAFTING (CABG)x 4   utilizing the left internal mammary artery and endoscopically harvested bilateral  sapheneous vein.;  Surgeon: Dennis Poot, MD;  Location: [place];  Service: Open Heart Surgery;  Laterality: N/A;  . KNEE SURGERY    . TEE WITHOUT CARDIOVERSION N/A 07/06/2015   Procedure: TRANSESOPHAGEAL ECHOCARDIOGRAM (TEE);  Surgeon: Dennis Poot, MD;  Location: [place];  Service: Open Heart Surgery;  Laterality:  N/A;    Family History  Problem Relation Age of Onset  . Heart attack Father        died age 1  . Heart attack Brother        died age 48  . Heart attack Sister        died age 72  . Colon cancer Sister   . Liver cancer Sister   . Diabetes Maternal Grandmother   . Colon polyps Sister        and brothers x 2     Social History:  reports that he quit smoking about 53 years ago. he has never used smokeless tobacco. He reports that he does not drink alcohol or use drugs.  Allergies:  Allergies  Allergen Reactions  . Cymbalta [Duloxetine Hcl]     Dizziness  . Keflex [Cephalexin] Other (See Comments)    Reaction: unknown   . Simvastatin Other (See Comments)    Reaction: unknown   . Sudafed [Pseudoephedrine]     Dizziness    Medications: Prior to Admission:  ( Prior to Admission medications   Medication Sig Start Date End Date Taking? Authorizing Provider  aspirin EC 81 MG EC tablet Take 1 tablet (81 mg total) by mouth daily. 07/11/15  Yes Gold, Wilder Glade, PA-C  butalbital-acetaminophen-caffeine [place], [facility]) [phone] MG tablet Take 1-2 tablets by mouth every 6 (six) hours as needed for headache. 11/29/16 11/29/17 Yes [name],  Gwyndolyn Saxon, MD  CALCIUM PO Take 1 tablet by mouth daily.   Yes [provider]  Cholecalciferol (VITAMIN D PO) Take 5,000 Units by mouth 2 (two) times daily.   Yes [provider]  cyanocobalamin 1000 MCG tablet Take 1,000 mcg by mouth daily.   Yes [provider]  diclofenac sodium (VOLTAREN) 1 % GEL Apply 1 application topically 3 (three) times daily. Ribs, back. Patient taking differently: Apply 1 application topically daily. Ribs, back. 05/03/16  Yes Dennis Staggers, MD  Flaxseed, Linseed, 1000 MG CAPS Take 1,300 mg by mouth daily.    Yes [provider]  gabapentin (NEURONTIN) 100 MG capsule Take 200 mg by mouth 2 (two) times daily. Take one tablet in the morning and one tablet in the evening    Yes [provider]  Magnesium 250 MG TABS Take 500 mg by mouth daily.    Yes [provider]  multivitamin [facility]) per tablet Take 1 tablet by mouth daily.     Yes [provider]  fludrocortisone (FLORINEF) 0.1 MG tablet TAKE 2 TABLETS(200 MCG) BY MOUTH DAILY 05/02/17   Dennis Mutters, PA-C  omeprazole (PRILOSEC) 20 MG capsule TAKE 2 CAPSULES BY MOUTH EVERY DAY FOR INDIGESTION Patient not taking: Reported on 05/17/2017 02/14/17   Dennis Pinto, MD     Results for orders placed or performed during the hospital encounter of 05/17/17 (from the past 48 hour(s))  Lipase, blood     Status: None   Collection Time: 05/17/17  7:10 AM  Result Value Ref Range   Lipase 31 11 - 51 U/L  Comprehensive metabolic panel     Status: Abnormal   Collection Time: 05/17/17  7:10 AM  Result Value Ref Range   Sodium 138 135 - 145 mmol/L   Potassium 3.8 3.5 - 5.1 mmol/L   Chloride 98 (L) 101 - 111 mmol/L   CO2 27 22 - 32 mmol/L   Glucose, Bld 175 (H) 65 - 99 mg/dL   BUN 29 (H) 6 - 20 mg/dL   Creatinine, Ser 1.36 (H) 0.61 - 1.24 mg/dL   Calcium 9.3 8.9 - 10.3 mg/dL   Total Protein 6.9 6.5 - 8.1 g/dL   Albumin 3.7 3.5 - 5.0 g/dL   AST 26 15 - 41 U/L   ALT 19 17 - 63 U/L   Alkaline Phosphatase 80 38 - 126 U/L   Total Bilirubin 0.9 0.3 - 1.2 mg/dL   GFR calc non Af Amer 47 (L) >60 mL/min   GFR calc Af Amer 55 (L) >60 mL/min    Comment: (NOTE) The eGFR has been calculated using the CKD EPI equation. This calculation has not been validated in all clinical situations. eGFR's persistently <60 mL/min signify possible Chronic Kidney Disease.    Anion gap 13 5 - 15  CBC     Status: Abnormal   Collection Time: 05/17/17  7:10 AM  Result Value Ref Range   WBC 13.7 (H) 4.0 - 10.5 K/uL   RBC 4.81 4.22 - 5.81 MIL/uL   Hemoglobin 14.7 13.0 - 17.0 g/dL   HCT 44.0 39.0 - 52.0 %   MCV 91.5 78.0 - 100.0 fL   MCH 30.6 26.0 - 34.0 pg   MCHC 33.4 30.0 - 36.0 g/dL   RDW 15.0 11.5 - 15.5 %    Platelets 156 150 - 400 K/uL  Urinalysis, Routine w reflex microscopic     Status: Abnormal   Collection Time: 05/17/17  7:48 AM  Result Value Ref Range   Color, Urine YELLOW YELLOW   APPearance CLOUDY (A) CLEAR   Specific Gravity, Urine 1.015 1.005 - 1.030   pH 7.0 5.0 - 8.0   Glucose, UA NEGATIVE NEGATIVE mg/dL   Hgb urine dipstick NEGATIVE NEGATIVE   Bilirubin Urine NEGATIVE NEGATIVE   Ketones, ur NEGATIVE NEGATIVE mg/dL   Protein, ur NEGATIVE NEGATIVE mg/dL   Nitrite NEGATIVE NEGATIVE   Leukocytes, UA NEGATIVE NEGATIVE    US Abdomen Limited Ruq  Result Date: 05/17/2017 CLINICAL DATA:  Acute abdominal pain, hypertension EXAM: ULTRASOUND ABDOMEN LIMITED RIGHT UPPER QUADRANT COMPARISON:  CT 08/14/2013 FINDINGS: Gallbladder: Gallstones measuring up to 0.7 cm. No gallbladder wall thickening or pericholecystic fluid. The gallbladder is incompletely distended. Common bile duct: Diameter: 4.4 mm, unremarkable Liver: No focal lesion identified. Within normal limits in parenchymal echogenicity. Portal vein is patent on color Doppler imaging with normal direction of blood flow towards the liver. There is a trace amount of perihepatic ascites. IMPRESSION: 1. Cholelithiasis without other ultrasound evidence of cholecystitis or biliary obstruction. 2. Trace perihepatic ascites. Electronically Signed   By: Lucrezia Europe M.D.   On: 05/17/2017 09:34    Review of Systems  Constitutional: Negative for chills, diaphoresis, fever, malaise/fatigue and weight loss.  HENT: Negative.   Eyes: Negative.   Respiratory: Positive for cough (Clear sputum). Negative for hemoptysis, sputum production, shortness of breath and wheezing.   Cardiovascular: Negative.   Gastrointestinal: Positive for abdominal pain (Acute onset of right upper quadrant pain this morning around 3 AM.  It woke him up.), constipation (Chronic constipation issues.  Done Citrucel twice daily at home.), heartburn (On Prilosec), nausea and vomiting  (Some dry heaves but no vomiting.). Negative for blood in stool, diarrhea and melena.  Genitourinary: Negative.   Musculoskeletal: Positive for back pain and joint pain.  Skin: Negative.   Neurological: Positive for dizziness (This a.m. when the pain was severe.). Negative for weakness.  Endo/Heme/Allergies: Negative for environmental allergies and polydipsia. Does not bruise/bleed easily.  Psychiatric/Behavioral: Negative.    Blood pressure 123/66, pulse (!) 52, temperature 97.6 F (36.4 C), temperature source Oral, resp. rate 14, SpO2 98 %. Physical Exam  Constitutional: He is oriented to person, place, and time. He appears well-developed and well-nourished. No distress.  HENT:  Head: Normocephalic and atraumatic.  Mouth/Throat: Oropharyngeal exudate present.  Eyes: Right eye exhibits no discharge. Left eye exhibits no discharge. No scleral icterus.  Pupils are equal Bilateral arcus senilis.  Neck: Normal range of motion. Neck supple. No JVD present. No tracheal deviation present. No thyromegaly present.  Cardiovascular: Normal rate, regular rhythm, normal heart sounds and intact distal pulses.  No murmur heard. Respiratory: Effort normal and breath sounds normal. No respiratory distress. He has no wheezes. He has no rales. He exhibits no tenderness.  GI: Soft. He exhibits no distension and no mass. There is tenderness (Most severe in the right upper quadrant but also tender to a lesser extent in the right lower quadrant.). There is no rebound and no guarding.  Musculoskeletal: He exhibits no edema or tenderness.  He could not walk much further than the end of his driveway currently secondary to back pain  Lymphadenopathy:    He has no cervical adenopathy.  Neurological: He is alert and oriented to person, place, and time. No cranial nerve deficit.  Reports neuropathy in both lower extremities  Skin: Skin is warm and dry. No rash noted. He is not diaphoretic. No erythema. No pallor.   Psychiatric:  He has a normal mood and affect. His behavior is normal. Judgment and thought content normal.    Assessment/Plan: Acute onset of right upper quadrant pain with nausea. Cholelithiasis/acute cholecystitis. CAD-status post PTCA x2/status post CABG Diabetes with diabetic neuropathy. History of tobacco use IBS/chronic constipation Hypertension   Plan: He continues to have acute pain in the right upper quadrant.  He may have passed a stone this a.m.  I would continue him on some Zosyn, and clear liquids only today.  He would need medical clearance/cardiac evaluation.  I will make him n.p.o. after midnight, in hopes of doing laparoscopic cholecystectomy tomorrow.   Asmaa Tirpak 05/17/2017, 10:13 AM

## 2017-05-17 NOTE — ED Notes (Signed)
Patients heart rate is ranging between 45-50 beats/min. Paged Emokpae,MD and made aware.

## 2017-05-17 NOTE — ED Notes (Signed)
Patient c/o back pain. Gave patient a ice pack for mid left back area.

## 2017-05-17 NOTE — Transfer of Care (Signed)
Immediate Anesthesia Transfer of Care Note  Patient: [name]  Procedure(s) Performed: DIAGNOSTIC LAPAROSCOPY, (N/A )  Patient Location: PACU  Anesthesia Type:General  Level of Consciousness: awake, alert  and oriented  Airway & Oxygen Therapy: Patient Spontanous Breathing and Patient connected to face mask oxygen  Post-op Assessment: Report given to RN and Post -op Vital signs reviewed and stable  Post vital signs: Reviewed and stable  Last Vitals:  Vitals:   05/17/17 1644 05/17/17 1746  BP: (!) 145/82 128/77  Pulse: (!) 103 (!) 110  Resp: 18 19  Temp:  36.6 C  SpO2: 91% 94%    Last Pain:  Vitals:   05/17/17 1615  TempSrc:   PainSc: 10-Worst pain ever         Complications: No apparent anesthesia complications

## 2017-05-17 NOTE — H&P (Signed)
Patient Demographics:    [name], is a 84 y.o. male  MRN: [id number]   DOB - Jul 11, 1933  Admit Date - 05/17/2017  Outpatient Primary MD for the patient is [name], MD   Assessment & Plan:    Active Problems:   Cholelithiasis  CT Abd/Pelvis :- Severely dilated loops of proximal small bowel are seen in the right side of the abdomen, with transition zone seen both proximally and distally to the dilated small bowel loops. Potentially this may be due to adhesions, but the possibility of volvulus or closed loop obstruction cannot be excluded.  Mild cholelithiasis.  Abd Ultrasound:- 1. Cholelithiasis without other ultrasound evidence of cholecystitis or biliary obstruction. 2. Trace perihepatic ascites.  Plan:- 1)SBO-significant abdominal pain, persistent emesis, and leukocytosis noted,???  Closed-loop obstruction, ???  Volvulus ????  Adhesions-discussed with surgical team and  Dr Leighton Ruff Earnstine Regal, [place]) to OR on 05/17/17, PRN antiemetics, IV Pepcid, as needed opiates  2)Cholelithiasis-most likely an incidental finding that would not explain patient's current presentation  3) intractable emesis and abdominal pain-secondary to #1 above, treat as in #1 above  4)H/o CAD/ Prior CABG-no chest pain, no ACS type symptoms, continue aspirin as long as it is okay with surgical team, patient has bradycardia at baseline precluding beta-blocker use, continue Florinef for orthostatic hypotension, monitor electrolytes closely with Florinef use, patient is apparently intolerant to statin drugs  With History of - Reviewed by me  Past Medical History:  Diagnosis Date  . 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 Colon polyps   . Coronary atherosclerosis of unspecified type of vessel, native or graft   .  Difficult intubation   . Esophageal reflux   . Hypertension   . IBS (irritable bowel syndrome)   . Orthostatic hypotension    "BP has been dropping alot when I stand up for the last month or so" (02/17/2016)  . Vitamin B 12 deficiency   . Vitamin D deficiency       Past Surgical History:  Procedure Laterality Date  . BLADDER SURGERY  1969   traumatic pelvic fractures, urethral and bladder repair  . CARDIAC CATHETERIZATION N/A 07/01/2015   Procedure: Left Heart Cath and Coronary Angiography;  Surgeon: Wellington Hampshire, MD;  Location: [place] [facility];  Service: Cardiovascular;  Laterality: N/A;  . CORONARY ANGIOPLASTY WITH STENT PLACEMENT    . CORONARY ARTERY BYPASS GRAFT N/A 07/06/2015   Procedure: CORONARY ARTERY BYPASS GRAFTING (CABG)x 4   utilizing the left internal mammary artery and endoscopically harvested bilateral  sapheneous vein.;  Surgeon: Ivin Poot, MD;  Location: [place];  Service: Open Heart Surgery;  Laterality: N/A;  . KNEE SURGERY    . TEE WITHOUT CARDIOVERSION N/A 07/06/2015   Procedure: TRANSESOPHAGEAL ECHOCARDIOGRAM (TEE);  Surgeon: Ivin Poot, MD;  Location: [place];  Service: Open Heart Surgery;  Laterality: N/A;    Chief Complaint  Patient presents with  .  Abdominal Pain      HPI:    [name]  is a 84 y.o. male with past medical history relevant for previous coronary artery disease/CABG, history of orthostatic hypotension woke up around 3 AM with severe abdominal pain worse in the right upper quadrant, no fevers no chills, initially patient had nausea without vomiting, however in the ED here after morphine sulfate, patient had persistent emesis, despite IV Zofran and rectal Compazine patient vomited again after drinking oral contrast.     Hx of abdominal surgery secondary to urologic trauma in the 1970s. Pain is severe. No fever or diarrhea  Initially gallbladder ultrasound showed gallstones without acute cholecystitis, CBC 4.4 mm in the ED patient  was seen by surgical team, consult appreciated, plan was initially for lap chole on 05/18/2017  Given generalized abdominal pain and persistent emesis , leukocytosis with white count of 13.7, CT abdomen and pelvis was ordered which showed possible closed loop obstruction (please see Full CT report above), who case again discussed with surgical team Dr Leighton Ruff, Pt going to OR on 05/07/17.    Hx of abdominal surgery secondary to urologic trauma in the 1970s. Pain is severe. No fever or diarrhea.  Additional history obtained from patient wife at bedside, there are no sick contacts at home  In ED patient is noted to have asymptomatic bradycardia, patient is not in any negative chronotropic agents, blood pressure is actually elevated most likely due to pain,    Review of systems:    In addition to the HPI above,   A full 12 point Review of 10 Systems was done, except as stated above, all other Review of 10 Systems were negative.    Social History:  Reviewed by me    Social History   Tobacco Use  . Smoking status: Former Smoker    Last attempt to quit: 07/16/1963    Years since quitting: 53.8  . Smokeless tobacco: Never Used  Substance Use Topics  . Alcohol use: No       Family History :  Reviewed by me    Family History  Problem Relation Age of Onset  . Heart attack Father        died age 63  . Heart attack Brother        died age 11  . Heart attack Sister        died age 54  . Colon cancer Sister   . Liver cancer Sister   . Diabetes Maternal Grandmother   . Colon polyps Sister        and brothers x 2      Home Medications:   Prior to Admission medications   Medication Sig Start Date End Date Taking? Authorizing Provider  aspirin EC 81 MG EC tablet Take 1 tablet (81 mg total) by mouth daily. 07/11/15  Yes Gold, Wilder Glade, PA-C  butalbital-acetaminophen-caffeine [place], [facility]) [phone] MG tablet Take 1-2 tablets by mouth every 6 (six) hours as needed for  headache. 11/29/16 11/29/17 Yes [name], MD  CALCIUM PO Take 1 tablet by mouth daily.   Yes [provider]  Cholecalciferol (VITAMIN D PO) Take 5,000 Units by mouth 2 (two) times daily.   Yes [provider]  cyanocobalamin 1000 MCG tablet Take 1,000 mcg by mouth daily.   Yes [provider]  diclofenac sodium (VOLTAREN) 1 % GEL Apply 1 application topically 3 (three) times daily. Ribs, back. Patient taking differently: Apply 1 application topically daily. Ribs, back.  05/03/16  Yes Meredith Staggers, MD  Flaxseed, Linseed, 1000 MG CAPS Take 1,300 mg by mouth daily.    Yes [provider]  gabapentin (NEURONTIN) 100 MG capsule Take 200 mg by mouth 2 (two) times daily. Take one tablet in the morning and one tablet in the evening    Yes [provider]  Magnesium 250 MG TABS Take 500 mg by mouth daily.    Yes [provider]  multivitamin [facility]) per tablet Take 1 tablet by mouth daily.     Yes [provider]  fludrocortisone (FLORINEF) 0.1 MG tablet TAKE 2 TABLETS(200 MCG) BY MOUTH DAILY 05/02/17   Vicie Mutters, PA-C  omeprazole (PRILOSEC) 20 MG capsule TAKE 2 CAPSULES BY MOUTH EVERY DAY FOR INDIGESTION Patient not taking: Reported on 05/17/2017 02/14/17   [name], MD     Allergies:     Allergies  Allergen Reactions  . Cymbalta [Duloxetine Hcl]     Dizziness  . Keflex [Cephalexin] Other (See Comments)    Reaction: unknown   . Simvastatin Other (See Comments)    Reaction: unknown   . Sudafed [Pseudoephedrine]     Dizziness     Physical Exam:   Vitals  Blood pressure (!) 147/73, pulse (!) 51, temperature 97.6 F (36.4 C), temperature source Oral, resp. rate 15, SpO2 96 %.  Physical Examination: General appearance - alert, well appearing, and in no distress  Mental status - alert, oriented to person, place, and time, Eyes - sclera anicteric Neck - supple, no JVD elevation , Chest - clear  to  auscultation bilaterally, symmetrical air movement,  Heart - S1 and S2 normal,  Abdomen - soft, generalized abdominal tenderness worse in the right upper quadrant, there is some voluntary guarding, no rebound, no CVA area tenderness Neurological - screening mental status exam normal, neck supple without rigidity, cranial nerves II through XII intact, DTR's normal and symmetric Extremities - no pedal edema noted, intact peripheral pulses  Skin - warm, dry, no jaundice Psych-affect is appropriate   Data Review:    CBC Recent Labs  Lab 05/17/17 0710  WBC 13.7*  HGB 14.7  HCT 44.0  PLT 156  MCV 91.5  MCH 30.6  MCHC 33.4  RDW 15.0   ------------------------------------------------------------------------------------------------------------------  Chemistries  Recent Labs  Lab 05/17/17 0710  NA 138  K 3.8  CL 98*  CO2 27  GLUCOSE 175*  BUN 29*  CREATININE 1.36*  CALCIUM 9.3  AST 26  ALT 19  ALKPHOS 80  BILITOT 0.9   ------------------------------------------------------------------------------------------------------------------ CrCl cannot be calculated (Unknown ideal weight.). ------------------------------------------------------------------------------------------------------------------ No results for input(s): TSH, T4TOTAL, T3FREE, THYROIDAB in the last 72 hours.  Invalid input(s): FREET3   Coagulation profile No results for input(s): INR, PROTIME in the last 168 hours. ------------------------------------------------------------------------------------------------------------------- No results for input(s): DDIMER in the last 72 hours. -------------------------------------------------------------------------------------------------------------------  Cardiac Enzymes No results for input(s): CKMB, TROPONINI, MYOGLOBIN in the last 168 hours.  Invalid input(s):  CK ------------------------------------------------------------------------------------------------------------------ No results found for: BNP   ---------------------------------------------------------------------------------------------------------------  Urinalysis    Component Value Date/Time   COLORURINE YELLOW 05/17/2017 0748   APPEARANCEUR CLOUDY (A) 05/17/2017 0748   LABSPEC 1.015 05/17/2017 0748   PHURINE 7.0 05/17/2017 0748   GLUCOSEU NEGATIVE 05/17/2017 0748   HGBUR NEGATIVE 05/17/2017 0748   BILIRUBINUR NEGATIVE 05/17/2017 [place] 05/17/2017 0748   PROTEINUR NEGATIVE 05/17/2017 0748   UROBILINOGEN 1.0 01/05/2015 1550   NITRITE NEGATIVE 05/17/2017 0748   LEUKOCYTESUR NEGATIVE 05/17/2017 0748    ----------------------------------------------------------------------------------------------------------------  Imaging Results:    US Abdomen Limited Ruq  Result Date: 05/17/2017 CLINICAL DATA:  Acute abdominal pain, hypertension EXAM: ULTRASOUND ABDOMEN LIMITED RIGHT UPPER QUADRANT COMPARISON:  CT 08/14/2013 FINDINGS: Gallbladder: Gallstones measuring up to 0.7 cm. No gallbladder wall thickening or pericholecystic fluid. The gallbladder is incompletely distended. Common bile duct: Diameter: 4.4 mm, unremarkable Liver: No focal lesion identified. Within normal limits in parenchymal echogenicity. Portal vein is patent on color Doppler imaging with normal direction of blood flow towards the liver. There is a trace amount of perihepatic ascites. IMPRESSION: 1. Cholelithiasis without other ultrasound evidence of cholecystitis or biliary obstruction. 2. Trace perihepatic ascites. Electronically Signed   By: Lucrezia Europe M.D.   On: 05/17/2017 09:34    Radiological Exams on Admission: US Abdomen Limited Ruq  Result Date: 05/17/2017 CLINICAL DATA:  Acute abdominal pain, hypertension EXAM: ULTRASOUND ABDOMEN LIMITED RIGHT UPPER QUADRANT COMPARISON:  CT 08/14/2013  FINDINGS: Gallbladder: Gallstones measuring up to 0.7 cm. No gallbladder wall thickening or pericholecystic fluid. The gallbladder is incompletely distended. Common bile duct: Diameter: 4.4 mm, unremarkable Liver: No focal lesion identified. Within normal limits in parenchymal echogenicity. Portal vein is patent on color Doppler imaging with normal direction of blood flow towards the liver. There is a trace amount of perihepatic ascites. IMPRESSION: 1. Cholelithiasis without other ultrasound evidence of cholecystitis or biliary obstruction. 2. Trace perihepatic ascites. Electronically Signed   By: Lucrezia Europe M.D.   On: 05/17/2017 09:34    DVT Prophylaxis -SCD   AM Labs Ordered, also please review Full Orders  Family Communication: Admission, patients condition and plan of care including tests being ordered have been discussed with the patient and wife who indicate understanding and agree with the plan   Code Status - Full Code  Likely DC to  home  Condition   Stable   Roxan Hockey M.D on 05/17/2017 at 2:38 PM   Between 7am to 7pm - Pager - [phone] After 7pm go to [email or web address] - password [email or web address]  [facility] - Office  [phone]  Voice Recognition Viviann Spare dictation system was used to create this note, attempts have been made to correct errors. Please contact the author with questions and/or clarifications.

## 2017-05-17 NOTE — Op Note (Addendum)
05/17/2017  8:36 PM  PATIENT:  [name]  84 y.o. male  Patient Care Team: [name], MD as PCP - General Rosalyn Gess Doreene Burke, PA-C as Physician Assistant (Cardiology) Lelon Perla, MD as Consulting Physician (Cardiology) Irene Shipper, MD as Consulting Physician (Gastroenterology)  PRE-OPERATIVE DIAGNOSIS:  closed loop bowel obstruction  POST-OPERATIVE DIAGNOSIS:  Long segment jejunitis  PROCEDURE:  DIAGNOSTIC LAPAROSCOPY   Surgeon(s): Leighton Ruff, MD  ASSISTANT: none   ANESTHESIA:   local and general  EBL: 3ml  Total I/O In: 1000 [I.V.:1000] Out: 1100 [Urine:550; Other:500; Blood:50]  DRAINS: none   SPECIMEN:  No Specimen  DISPOSITION OF SPECIMEN:  N/A  COUNTS:  YES  PLAN OF CARE: Admit to inpatient   PATIENT DISPOSITION:  PACU - hemodynamically stable.  INDICATION: 84 y.o. who presented to the ED with complaints of right lower quadrant pain.  CT scan was obtained which showed what appeared to be a closed  loop small bowel obstruction.  Given his peritonitis and tachycardia and elevated white blood cell count, it was recommended that he undergo diagnostic laparoscopy in the operating room.   OR FINDINGS: Long segment of inflamed jejunum.  No sign of obstruction.  DESCRIPTION: the patient was identified in the preoperative holding area and taken to the OR where they were laid supine on the operating room table.  General anesthesia was induced without difficulty. SCDs were also noted to be in place prior to the initiation of anesthesia.  The patient was then prepped and draped in the usual sterile fashion.   A surgical timeout was performed indicating the correct patient, procedure, positioning and need for preoperative antibiotics.   I began by making a supraumbilical incision using a 15 blade scalpel.  Dissection was carried down to the level of the fascia.  The fascia was incised at midline.  The peritoneum was entered bluntly using a Kelly clamp.   A pursestring suture was placed using 0 Vicryl.  A Hassan port was placed and the pursestring was tied around this.  The abdomen was insufflated to approximately 15 mmHg.  I then placed 2, 5 mm ports in the left upper and left lower quadrants.  At this point I began to evaluate the abdomen.  The omentum was pulled up over the small bowel.  Upon doing this, I found a long segment of inflamed small bowel.  I traced back one side of the inflammation to normal bowel.  There did not appear to be a transition point.  I continue to run this bowel to the terminal ileum where no obstruction was noted.  I then identified the inflamed loop of bowel again and found the other end.  There was also no transition point noted.  I trace this back to the ligament of Treitz with no other signs of obstruction.  I evaluated the right colon, transverse colon, descending colon and sigmoid colon.  There was no sign of any other pathology.  The pelvis was normal.  The liver, gallbladder and spleen also appeared normal.  Given the long segment of inflammation, I did not think it was wise to resect this as it still looked completely viable.  I decided to complete the procedure and desufflate the abdomen.  The ports were removed.  The pursestring suture was tied tightly to close the fascia.  The skin of the incisions was then closed using 4-0 Vicryl subcuticular sutures and Dermabond.  The patient tolerated this well and sent to the postanesthesia care unit  stable condition.  All counts were correct per operating room staff.

## 2017-05-17 NOTE — Progress Notes (Signed)
  Echocardiogram 2D Echocardiogram has been performed.  [name] 05/17/2017, 4:00 PM

## 2017-05-17 NOTE — Anesthesia Procedure Notes (Signed)
Procedure Name: Intubation Date/Time: 05/17/2017 7:34 PM Performed by: Aaran Enberg D, CRNA Pre-anesthesia Checklist: Patient identified, Emergency Drugs available, Suction available and Patient being monitored Patient Re-evaluated:Patient Re-evaluated prior to induction Oxygen Delivery Method: Circle system utilized Preoxygenation: Pre-oxygenation with 100% oxygen Induction Type: IV induction, Rapid sequence and Cricoid Pressure applied Laryngoscope Size: Glidescope and 4 Grade View: Grade I Tube type: Parker flex tip Tube size: 7.5 mm Number of attempts: 1 Airway Equipment and Method: Stylet Placement Confirmation: ETT inserted through vocal cords under direct vision,  positive ETCO2 and breath sounds checked- equal and bilateral Secured at: 22 cm Tube secured with: Tape Dental Injury: Teeth and Oropharynx as per pre-operative assessment

## 2017-05-17 NOTE — Anesthesia Preprocedure Evaluation (Addendum)
Anesthesia Evaluation  Patient identified by MRN, date of birth, ID band Patient awake    Reviewed: Allergy & Precautions, H&P , NPO status , Patient's Chart, lab work & pertinent test results  History of Anesthesia Complications (+) DIFFICULT AIRWAY  Airway Mallampati: II  TM Distance: >3 FB Neck ROM: Full    Dental no notable dental hx. (+) Edentulous Upper, Partial Lower, Dental Advisory Given   Pulmonary neg pulmonary ROS, former smoker,    Pulmonary exam normal breath sounds clear to auscultation       Cardiovascular hypertension, + CAD, + CABG and + Peripheral Vascular Disease   Rhythm:Regular Rate:Normal     Neuro/Psych negative neurological ROS  negative psych ROS   GI/Hepatic Neg liver ROS, GERD  ,  Endo/Other  negative endocrine ROS  Renal/GU Renal InsufficiencyRenal diseasenegative Renal ROS  negative genitourinary   Musculoskeletal  (+) Arthritis , Osteoarthritis,    Abdominal   Peds  Hematology negative hematology ROS (+)   Anesthesia Other Findings   Reproductive/Obstetrics negative OB ROS                           Anesthesia Physical Anesthesia Plan  ASA: III  Anesthesia Plan: General   Post-op Pain Management:    Induction: Intravenous, Rapid sequence and Cricoid pressure planned  PONV Risk Score and Plan: 3 and Ondansetron, Dexamethasone and Treatment may vary due to age or medical condition  Airway Management Planned: Oral ETT and Video Laryngoscope Planned  Additional Equipment:   Intra-op Plan:   Post-operative Plan: Extubation in OR  Informed Consent: I have reviewed the patients History and Physical, chart, labs and discussed the procedure including the risks, benefits and alternatives for the proposed anesthesia with the patient or authorized representative who has indicated his/her understanding and acceptance.   Dental advisory given  Plan  Discussed with: CRNA  Anesthesia Plan Comments:        Anesthesia Quick Evaluation

## 2017-05-17 NOTE — Progress Notes (Addendum)
H&P ADDENDUM:  I personally reviewed patient's record, examined the patient, and formulated the following assessment and plan:  I agree with Dr Orest Dikes assessment.  Pt with CT findings concerning for closed loop obstruction.  Risk for perforation is high.  I have recommended diagnostic laparoscopy with possible bowel resection.  I have discussed with the patient and all questions were answered.  Risks include bleeding, infection, damage to adjacent structures, leak of surgical connections and recurrence.    Rosario Adie, MD  [facility]

## 2017-05-17 NOTE — Progress Notes (Signed)
Pharmacy Antibiotic Note  [name] is a 84 y.o. male admitted on 05/17/2017 with Intra-abd infection.  Pharmacy has been consulted for Zosyn dosing.  Gallstones noted on U/S, surgical consult  Plan: Zosyn 3.375g IV q8h (4 hour infusion). SCr 1.36, clearance ~ 40 ml/min normalized  Pharmacy will sign off note-writing, will adjust if renal function changes    Temp (24hrs), Avg:97.6 F (36.4 C), Min:97.6 F (36.4 C), Max:97.6 F (36.4 C)  Recent Labs  Lab 05/17/17 0710  WBC 13.7*  CREATININE 1.36*    CrCl cannot be calculated (Unknown ideal weight.).    Allergies  Allergen Reactions  . Cymbalta [Duloxetine Hcl]     Dizziness  . Keflex [Cephalexin] Other (See Comments)    Reaction: unknown   . Simvastatin Other (See Comments)    Reaction: unknown   . Sudafed [Pseudoephedrine]     Dizziness   Antimicrobials this admission: 1/16 Zosyn >>   Dose adjustments this admission:  Microbiology results: None ordered on admission  Thank you for allowing pharmacy to be a part of this patient's care.  Minda Ditto PharmD Pager [phone] 05/17/2017, 11:52 AM

## 2017-05-17 NOTE — ED Triage Notes (Signed)
Pt complains of right upper quadrant pain since 4am, pt states he felt fine when going to bed Pt is nauseated, no vomiting He states that his right upper quadrant is very sore Pt still has his gallbladder

## 2017-05-17 NOTE — ED Notes (Signed)
Per Emokpae give Zofran 4mg  q4hrs prn

## 2017-05-17 NOTE — Anesthesia Postprocedure Evaluation (Signed)
Anesthesia Post Note  Patient: [name]  Procedure(s) Performed: DIAGNOSTIC LAPAROSCOPY, (N/A )     Patient location during evaluation: PACU Anesthesia Type: General Level of consciousness: awake and alert Pain management: pain level controlled Vital Signs Assessment: post-procedure vital signs reviewed and stable Respiratory status: spontaneous breathing, nonlabored ventilation, respiratory function stable and patient connected to nasal cannula oxygen Cardiovascular status: blood pressure returned to baseline and stable Postop Assessment: no apparent nausea or vomiting Anesthetic complications: no    Last Vitals:  Vitals:   05/17/17 2104 05/17/17 2115  BP: 138/86 (!) 146/85  Pulse: (!) 109 (!) 106  Resp:  13  Temp:    SpO2:  100%    Last Pain:  Vitals:   05/17/17 2051  TempSrc:   PainSc: 0-No pain                 Dorie Ohms,W. EDMOND

## 2017-05-17 NOTE — ED Notes (Signed)
Paged Emokpae,MD about patients n/v. Patient vomited both bottles of contrast up.

## 2017-05-18 ENCOUNTER — Encounter (HOSPITAL_COMMUNITY): Payer: Self-pay | Admitting: General Surgery

## 2017-05-18 DIAGNOSIS — R1084 Generalized abdominal pain: Secondary | ICD-10-CM

## 2017-05-18 DIAGNOSIS — R933 Abnormal findings on diagnostic imaging of other parts of digestive tract: Secondary | ICD-10-CM

## 2017-05-18 DIAGNOSIS — R112 Nausea with vomiting, unspecified: Secondary | ICD-10-CM

## 2017-05-18 LAB — COMPREHENSIVE METABOLIC PANEL
ALT: 13 U/L — ABNORMAL LOW (ref 17–63)
AST: 26 U/L (ref 15–41)
Albumin: 2.8 g/dL — ABNORMAL LOW (ref 3.5–5.0)
Alkaline Phosphatase: 56 U/L (ref 38–126)
Anion gap: 8 (ref 5–15)
BUN: 32 mg/dL — ABNORMAL HIGH (ref 6–20)
CO2: 25 mmol/L (ref 22–32)
Calcium: 7.9 mg/dL — ABNORMAL LOW (ref 8.9–10.3)
Chloride: 108 mmol/L (ref 101–111)
Creatinine, Ser: 1.42 mg/dL — ABNORMAL HIGH (ref 0.61–1.24)
GFR calc Af Amer: 52 mL/min — ABNORMAL LOW (ref >60)
GFR calc non Af Amer: 45 mL/min — ABNORMAL LOW (ref >60)
Glucose, Bld: 170 mg/dL — ABNORMAL HIGH (ref 65–99)
Potassium: 4.7 mmol/L (ref 3.5–5.1)
Sodium: 141 mmol/L (ref 135–145)
Total Bilirubin: 0.8 mg/dL (ref 0.3–1.2)
Total Protein: 5.5 g/dL — ABNORMAL LOW (ref 6.5–8.1)

## 2017-05-18 LAB — CBC
HCT: 37.1 % — ABNORMAL LOW (ref 39.0–52.0)
Hemoglobin: 11.9 g/dL — ABNORMAL LOW (ref 13.0–17.0)
MCH: 30.1 pg (ref 26.0–34.0)
MCHC: 32.1 g/dL (ref 30.0–36.0)
MCV: 93.7 fL (ref 78.0–100.0)
Platelets: 169 10*3/uL (ref 150–400)
RBC: 3.96 MIL/uL — ABNORMAL LOW (ref 4.22–5.81)
RDW: 15.9 % — ABNORMAL HIGH (ref 11.5–15.5)
WBC: 15.6 10*3/uL — ABNORMAL HIGH (ref 4.0–10.5)

## 2017-05-18 LAB — LIPASE, BLOOD: Lipase: 18 U/L (ref 11–51)

## 2017-05-18 MED ORDER — PIPERACILLIN-TAZOBACTAM 3.375 G IVPB
3.3750 g | Freq: Three times a day (TID) | INTRAVENOUS | Status: DC
  Administered 2017-05-18 – 2017-05-21 (×10): 3.375 g via INTRAVENOUS
  Filled 2017-05-18 (×11): qty 50

## 2017-05-18 MED ORDER — FLUDROCORTISONE ACETATE 0.1 MG PO TABS
0.2000 mg | ORAL_TABLET | Freq: Every day | ORAL | Status: DC
  Administered 2017-05-20 – 2017-05-21 (×2): 0.2 mg via ORAL
  Filled 2017-05-18 (×3): qty 2

## 2017-05-18 MED ORDER — PIPERACILLIN-TAZOBACTAM 3.375 G IVPB 30 MIN
3.3750 g | Freq: Once | INTRAVENOUS | Status: DC
  Filled 2017-05-18: qty 50

## 2017-05-18 NOTE — Progress Notes (Signed)
[facility]  PROGRESS NOTE  DONYALE BERTHOLD ZOX:[id number] DOB: 1933-12-15 DOA: 05/17/2017 PCP: [name], MD   Brief HPI:   84 y.o. male with past medical history relevant for previous coronary artery disease/CABG, history of orthostatic hypotension woke up around 3 AM with severe abdominal pain worse in the right upper quadrant, no fevers no chills, initially patient had nausea without vomiting, however in the ED here after morphine sulfate, patient had persistent emesis, despite IV Zofran and rectal Compazine patient vomited again after drinking oral contrast.  CT abdomen and pelvis was ordered which showed possible closed loop obstruction (please see Full CT report above), who case again discussed with surgical team Dr Leighton Ruff, Pt going to OR on 05/07/17.     Subjective   Patient seen and examined,  nasogastric tube is still in place.   Assessment/Plan:     1. Long segment jejunitis-patient underwent diagnostic laparoscopy and no bowel obstruction was found. Long segment tendinitis was discovered on laparoscopy, G.I. has been consulted. Currently NG tube in place.Continue IV Zosyn.  2. Cholelithiasis-incidental finding, no intervention needed at this time.  3. Intractable nausea vomiting-secondary to above. Continue antiemetics PRN.  4. History of CAD/prior CABG-no chest pain. Continue aspirin, fluorine.  5. Acute kidney injury- patient's BUN/Cr is 32 /1.42, worsening, Yesterday was 29/ 1.36, continue IV Normal saline at 125 ml/hr. Follow BMP in am.    DVT prophylaxis: SCD's  Code Status: full code  Family Communication: discussed with patient's wife at bedside  Disposition Plan: likely home in one to two days   Consultants:  general surgery  Procedures:  diagnostic laparoscopy  Continuous infusions . sodium chloride    . sodium chloride 125 mL/hr at 05/18/17 0514  . famotidine (PEPCID) IV Stopped (05/18/17 1112)  . lactated ringers    .  piperacillin-tazobactam (ZOSYN)  IV 3.375 g (05/18/17 1200)      Antibiotics:   Anti-infectives (From admission, onward)   Start     Dose/Rate Route Frequency Ordered Stop   05/18/17 1100  piperacillin-tazobactam (ZOSYN) IVPB 3.375 g     3.375 g 12.5 mL/hr over 240 Minutes Intravenous Every 8 hours 05/18/17 1034     05/18/17 0945  piperacillin-tazobactam (ZOSYN) IVPB 3.375 g  Status:  Discontinued     3.375 g 100 mL/hr over 30 Minutes Intravenous  Once 05/18/17 0940 05/18/17 1034   05/17/17 1845  clindamycin (CLEOCIN) IVPB 900 mg     900 mg 100 mL/hr over 30 Minutes Intravenous On call to O.R. 05/17/17 1840 05/17/17 1937   05/17/17 1700  piperacillin-tazobactam (ZOSYN) IVPB 3.375 g  Status:  Discontinued     3.375 g 12.5 mL/hr over 240 Minutes Intravenous Every 8 hours 05/17/17 1112 05/17/17 1717   05/17/17 0830  piperacillin-tazobactam (ZOSYN) IVPB 3.375 g     3.375 g 100 mL/hr over 30 Minutes Intravenous  Once 05/17/17 0816 05/17/17 1006       Objective   Vitals:   05/17/17 2115 05/17/17 2130 05/17/17 2155 05/18/17 0636  BP: (!) 146/85 123/76 119/70 106/66  Pulse: (!) 106 (!) 103 (!) 106 94  Resp: 13 12 12 16   Temp:  99.7 F (37.6 C) (!) 100.6 F (38.1 C) 98.5 F (36.9 C)  TempSrc:    Oral  SpO2: 100% 100% 100% 100%  Weight:      Height:        Intake/Output Summary (Last 24 hours) at 05/18/2017 1302 Last data filed at 05/18/2017 1200 Gross per 24  hour  Intake 2922.92 ml  Output 1250 ml  Net 1672.92 ml   Filed Weights   05/17/17 1900  Weight: 85.3 kg (188 lb)     Physical Examination:   Physical Exam: Eyes: No icterus, extraocular muscles intact  Mouth: Oral mucosa is moist, no lesions on palate,  Neck: Supple, no deformities, masses, or tenderness Lungs: Normal respiratory effort, bilateral clear to auscultation, no crackles or wheezes.  Heart: Regular rate and rhythm, S1 and S2 normal, no murmurs, rubs auscultated Abdomen: BS  normoactive,soft,nondistended, generalized tender to palpation,no organomegaly Extremities: No pretibial edema, no erythema, no cyanosis, no clubbing Neuro : Alert and oriented to time, place and person, No focal deficits      Data Reviewed: I have personally reviewed following labs and imaging studies  CBG: Recent Labs  Lab 05/17/17 2103  GLUCAP 145*    CBC: Recent Labs  Lab 05/17/17 0710 05/18/17 0623  WBC 13.7* 15.6*  HGB 14.7 11.9*  HCT 44.0 37.1*  MCV 91.5 93.7  PLT 156 409    Basic Metabolic Panel: Recent Labs  Lab 05/17/17 0710 05/18/17 0623  NA 138 141  K 3.8 4.7  CL 98* 108  CO2 27 25  GLUCOSE 175* 170*  BUN 29* 32*  CREATININE 1.36* 1.42*  CALCIUM 9.3 7.9*    No results found for this or any previous visit (from the past 240 hour(s)).   Liver Function Tests: Recent Labs  Lab 05/17/17 0710 05/18/17 0623  AST 26 26  ALT 19 13*  ALKPHOS 80 56  BILITOT 0.9 0.8  PROT 6.9 5.5*  ALBUMIN 3.7 2.8*   Recent Labs  Lab 05/17/17 0710 05/18/17 0623  LIPASE 31 18   No results for input(s): AMMONIA in the last 168 hours.     Studies: Ct Abdomen Pelvis Wo Contrast  Result Date: 05/17/2017 CLINICAL DATA:  Acute right upper quadrant abdominal pain, nausea. EXAM: CT ABDOMEN AND PELVIS WITHOUT CONTRAST TECHNIQUE: Multidetector CT imaging of the abdomen and pelvis was performed following the standard protocol without IV contrast. COMPARISON:  CT scan of August 14, 2013. FINDINGS: Lower chest: No acute abnormality. Hepatobiliary: Mild cholelithiasis is noted. No focal abnormality seen in the liver on these unenhanced images. Pancreas: Unremarkable. No pancreatic ductal dilatation or surrounding inflammatory changes. Spleen: Normal in size without focal abnormality. Adrenals/Urinary Tract: Adrenal glands are unremarkable. Kidneys are normal, without renal calculi, focal lesion, or hydronephrosis. Bladder is unremarkable. Stomach/Bowel: The stomach is  unremarkable. No colonic dilatation is noted. There are several loops of severely dilated proximal small bowel seen in the right side of the abdomen, with nondilated bowel seen proximal and distal to this. Proximal transition zone is seen on image number 39 of series 2, with distal transition zone seen on same image. Possibility of volvulus or closed loop obstruction cannot be excluded. Alternatively, this may represent adhesions. Vascular/Lymphatic: 3.6 cm infrarenal abdominal aortic aneurysm is noted. No significant adenopathy is noted. 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 Reproductive: Mild prostatic enlargement is noted. Other: No abdominal wall hernia or abnormality. No abdominopelvic ascites. Musculoskeletal: No acute or significant osseous findings. IMPRESSION: Severely dilated loops of proximal small bowel are seen in the right side of the abdomen, with transition zone seen both proximally and distally to the dilated small bowel loops. Potentially this may be due to adhesions, but the possibility of volvulus or closed loop obstruction cannot be excluded. Critical Value/emergent results were called by telephone at the time  of interpretation on 05/17/2017 at 4:36 pm to Dr. Venora Maples , who verbally acknowledged these results. Mild cholelithiasis. 3.6 cm infrarenal abdominal aortic aneurysm. Recommend followup by ultrasound in 2 years. This recommendation follows ACR consensus guidelines: White Paper of the ACR Incidental Findings Committee II on Vascular Findings. J Am Coll Radiol 2013; 10:789-794. Electronically Signed   By: Marijo Conception, M.D.   On: 05/17/2017 16:36   US Abdomen Limited Ruq  Result Date: 05/17/2017 CLINICAL DATA:  Acute abdominal pain, hypertension EXAM: ULTRASOUND ABDOMEN LIMITED RIGHT UPPER QUADRANT COMPARISON:  CT 08/14/2013 FINDINGS: Gallbladder: Gallstones measuring up to 0.7 cm. No gallbladder wall thickening or pericholecystic fluid. The gallbladder is  incompletely distended. Common bile duct: Diameter: 4.4 mm, unremarkable Liver: No focal lesion identified. Within normal limits in parenchymal echogenicity. Portal vein is patent on color Doppler imaging with normal direction of blood flow towards the liver. There is a trace amount of perihepatic ascites. IMPRESSION: 1. Cholelithiasis without other ultrasound evidence of cholecystitis or biliary obstruction. 2. Trace perihepatic ascites. Electronically Signed   By: Lucrezia Europe M.D.   On: 05/17/2017 09:34    Scheduled Meds: . aspirin EC  81 mg Oral Daily  . calcium carbonate  1 tablet Oral Daily  . cholecalciferol  5,000 Units Oral BID  . fludrocortisone  0.1 mg Oral Daily  . gabapentin  200 mg Oral BID  . heparin  5,000 Units Subcutaneous Q8H  . magnesium oxide  400 mg Oral Daily  . multivitamin with minerals  1 tablet Oral Daily  . ondansetron (ZOFRAN) IV  4 mg Intravenous Q6H  . senna  1 tablet Oral BID  . sodium chloride flush  3 mL Intravenous Q12H  . cyanocobalamin  1,000 mcg Oral Daily      Time spent: 20 min  [facility] Pager [phone]. If 7PM-7AM, please contact night-coverage at [email or web address], Office  [phone]  password [email or web address]  05/18/2017, 1:02 PM  LOS: 1 day

## 2017-05-18 NOTE — Progress Notes (Signed)
[facility] Progress Note  1 Day Post-Op  Subjective: CC-  Sitting up in bed, wife at bedside. States that he is feeling better than yesterday. No pain at rest but he does report persistent abdominal pain proximal to umbilicus with palpation. Denies n/v. No flatus or BM.  Objective: Vital signs in last 24 hours: Temp:  [97.8 F (36.6 C)-100.6 F (38.1 C)] 98.5 F (36.9 C) (01/17 0636) Pulse Rate:  [43-130] 94 (01/17 0636) Resp:  [11-24] 16 (01/17 0636) BP: (106-191)/(62-99) 106/66 (01/17 0636) SpO2:  [91 %-100 %] 100 % (01/17 0636) Weight:  [188 lb (85.3 kg)] 188 lb (85.3 kg) (01/16 1900)    Intake/Output from previous day: 01/16 0701 - 01/17 0700 In: 2972.9 [I.V.:2872.9; IV Piggyback:100] Out: 1150 [Urine:600; Blood:50] Intake/Output this shift: No intake/output data recorded.  PE: Gen:  Alert, NAD, pleasant HEENT: EOM's intact, pupils equal and round Card:  RRR, no M/G/R heard Pulm:  effort normal Abd: Soft, mild distension, few BS heard, multiple lap incisions C/D/I, TTP proximal and to the right of the umbilicus without rebound  Ext:  Calves soft and nontender Psych: A&Ox3  Skin: no rashes noted, warm and dry  Lab Results:  Recent Labs    05/17/17 0710 05/18/17 0623  WBC 13.7* 15.6*  HGB 14.7 11.9*  HCT 44.0 37.1*  PLT 156 169   BMET Recent Labs    05/17/17 0710 05/18/17 0623  NA 138 141  K 3.8 4.7  CL 98* 108  CO2 27 25  GLUCOSE 175* 170*  BUN 29* 32*  CREATININE 1.36* 1.42*  CALCIUM 9.3 7.9*   PT/INR No results for input(s): LABPROT, INR in the last 72 hours. CMP     Component Value Date/Time   NA 141 05/18/2017 0623   K 4.7 05/18/2017 0623   CL 108 05/18/2017 0623   CO2 25 05/18/2017 0623   GLUCOSE 170 (H) 05/18/2017 0623   BUN 32 (H) 05/18/2017 0623   CREATININE 1.42 (H) 05/18/2017 0623   CREATININE 1.20 (H) 04/04/2017 1138   CALCIUM 7.9 (L) 05/18/2017 0623   PROT 5.5 (L) 05/18/2017 0623   ALBUMIN 2.8 (L) 05/18/2017  0623   AST 26 05/18/2017 0623   ALT 13 (L) 05/18/2017 0623   ALKPHOS 56 05/18/2017 0623   BILITOT 0.8 05/18/2017 0623   GFRNONAA 45 (L) 05/18/2017 0623   GFRNONAA 56 (L) 04/04/2017 1138   GFRAA 52 (L) 05/18/2017 0623   GFRAA 65 04/04/2017 1138   Lipase     Component Value Date/Time   LIPASE 18 05/18/2017 0623       Studies/Results: Ct Abdomen Pelvis Wo Contrast  Result Date: 05/17/2017 CLINICAL DATA:  Acute right upper quadrant abdominal pain, nausea. EXAM: CT ABDOMEN AND PELVIS WITHOUT CONTRAST TECHNIQUE: Multidetector CT imaging of the abdomen and pelvis was performed following the standard protocol without IV contrast. COMPARISON:  CT scan of August 14, 2013. FINDINGS: Lower chest: No acute abnormality. Hepatobiliary: Mild cholelithiasis is noted. No focal abnormality seen in the liver on these unenhanced images. Pancreas: Unremarkable. No pancreatic ductal dilatation or surrounding inflammatory changes. Spleen: Normal in size without focal abnormality. Adrenals/Urinary Tract: Adrenal glands are unremarkable. Kidneys are normal, without renal calculi, focal lesion, or hydronephrosis. Bladder is unremarkable. Stomach/Bowel: The stomach is unremarkable. No colonic dilatation is noted. There are several loops of severely dilated proximal small bowel seen in the right side of the abdomen, with nondilated bowel seen proximal and distal to this. Proximal transition zone is seen on  image number 39 of series 2, with distal transition zone seen on same image. Possibility of volvulus or closed loop obstruction cannot be excluded. Alternatively, this may represent adhesions. Vascular/Lymphatic: 3.6 cm infrarenal abdominal aortic aneurysm is noted. No significant adenopathy is noted. 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 Reproductive: Mild prostatic enlargement is noted. Other: No abdominal wall hernia or abnormality. No abdominopelvic ascites. Musculoskeletal: No acute or  significant osseous findings. IMPRESSION: Severely dilated loops of proximal small bowel are seen in the right side of the abdomen, with transition zone seen both proximally and distally to the dilated small bowel loops. Potentially this may be due to adhesions, but the possibility of volvulus or closed loop obstruction cannot be excluded. Critical Value/emergent results were called by telephone at the time of interpretation on 05/17/2017 at 4:36 pm to Dr. Venora Maples , who verbally acknowledged these results. Mild cholelithiasis. 3.6 cm infrarenal abdominal aortic aneurysm. Recommend followup by ultrasound in 2 years. This recommendation follows ACR consensus guidelines: White Paper of the ACR Incidental Findings Committee II on Vascular Findings. J Am Coll Radiol 2013; 10:789-794. Electronically Signed   By: Marijo Conception, M.D.   On: 05/17/2017 16:36   US Abdomen Limited Ruq  Result Date: 05/17/2017 CLINICAL DATA:  Acute abdominal pain, hypertension EXAM: ULTRASOUND ABDOMEN LIMITED RIGHT UPPER QUADRANT COMPARISON:  CT 08/14/2013 FINDINGS: Gallbladder: Gallstones measuring up to 0.7 cm. No gallbladder wall thickening or pericholecystic fluid. The gallbladder is incompletely distended. Common bile duct: Diameter: 4.4 mm, unremarkable Liver: No focal lesion identified. Within normal limits in parenchymal echogenicity. Portal vein is patent on color Doppler imaging with normal direction of blood flow towards the liver. There is a trace amount of perihepatic ascites. IMPRESSION: 1. Cholelithiasis without other ultrasound evidence of cholecystitis or biliary obstruction. 2. Trace perihepatic ascites. Electronically Signed   By: Lucrezia Europe M.D.   On: 05/17/2017 09:34    Anti-infectives: Anti-infectives (From admission, onward)   Start     Dose/Rate Route Frequency Ordered Stop   05/17/17 1845  clindamycin (CLEOCIN) IVPB 900 mg     900 mg 100 mL/hr over 30 Minutes Intravenous On call to O.R. 05/17/17 1840 05/17/17  1937   05/17/17 1700  piperacillin-tazobactam (ZOSYN) IVPB 3.375 g  Status:  Discontinued     3.375 g 12.5 mL/hr over 240 Minutes Intravenous Every 8 hours 05/17/17 1112 05/17/17 1717   05/17/17 0830  piperacillin-tazobactam (ZOSYN) IVPB 3.375 g     3.375 g 100 mL/hr over 30 Minutes Intravenous  Once 05/17/17 0816 05/17/17 1006       Assessment/Plan CAD-status post PTCA x2/status post CABG Diabetes with diabetic neuropathy. History of tobacco use IBS/chronic constipation Hypertension AKI - Cr 1.42, continue IVF  Long segment jejunititis, preop concern for closed loop bowel obstruction S/p DIAGNOSTIC LAPAROSCOPY 1/16 [name] - POD 1 - no obstruction seen intraoperatively, found to have a long segment of jejunitis, concern for possible IBD - no BM or flatus - Continue NG tube today. Please clamp NG to allow patient to ambulate today. Discontinue foley. I will ask GI to see patient for intraoperative findings as above.  ID - zosyn 1/16>>day#2, clindamycin preop FEN - IVF, NPO/NGT VTE - SCDs, heparin Foley - d/c today Follow up - [name], GI   LOS: 1 day    Wellington Hampshire , [facility] 05/18/2017, 8:35 AM Pager: [phone] Consults: [phone] Mon-Fri 7:00 am-4:30 pm Sat-Sun 7:00 am-11:30 am

## 2017-05-18 NOTE — Consult Note (Addendum)
Referring Provider:  CCS, Dr. Dalbert Batman Primary Care Physician:  [name], MD Primary Gastroenterologist:  Dr. Henrene Pastor  Reason for Consultation:  Jejunitis   HPI: [name] is a 83 y.o. male with past medical history relevant for previous coronary artery disease/CABG, history of orthostatic hypotension who presented to [facility] ED after waking up around 3 AM on the day of presentation with severe abdominal pain in the mid-abdomen.  Had nausea and vomiting as well.  Hx of abdominal surgery secondary to urologic trauma in the 1970s.  Initially gallbladder ultrasound showed gallstones without acute cholecystitis, CBD 4.4 mm.  Given generalized abdominal pain and persistent emesis, leukocytosis with white count of 13.7, CT abdomen and pelvis was ordered, which showed possible closed loop obstruction vs volvulus.  He was seen by surgery and underwent diagnostic laparoscopy on 1/16 by [name] where he was found to have a segmental jejunal inflammatory process of uncertain etiology.  No resection performed.  GI has been consulted to help determine the cause, etc.  His family reports that he's had intermittent abdominal (patient says groin) pain for the past 2 years.  NGT is still place with minimal output.  Not passing flatus.  Colonoscopy 10/2010 by Dr. Henrene Pastor was normal.  CT scan 07/2013 showed no small bowel issues at that time.  Past Medical History:  Diagnosis Date  . 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 Colon polyps   . Coronary atherosclerosis of unspecified type of vessel, native or graft   . Difficult intubation   . Esophageal reflux   . Hypertension   . IBS (irritable bowel syndrome)   . Orthostatic hypotension    "BP has been dropping alot when I stand up for the last month or so" (02/17/2016)  . Vitamin B 12 deficiency   . Vitamin D deficiency     Past Surgical History:  Procedure Laterality Date  . BLADDER SURGERY  1969   traumatic pelvic fractures, urethral and  bladder repair  . CARDIAC CATHETERIZATION N/A 07/01/2015   Procedure: Left Heart Cath and Coronary Angiography;  Surgeon: Wellington Hampshire, MD;  Location: [place] [facility];  Service: Cardiovascular;  Laterality: N/A;  . COLON RESECTION N/A 05/17/2017   Procedure: DIAGNOSTIC LAPAROSCOPY,;  Surgeon: Leighton Ruff, MD;  Location: [facility];  Service: General;  Laterality: N/A;  . CORONARY ANGIOPLASTY WITH STENT PLACEMENT    . CORONARY ARTERY BYPASS GRAFT N/A 07/06/2015   Procedure: CORONARY ARTERY BYPASS GRAFTING (CABG)x 4   utilizing the left internal mammary artery and endoscopically harvested bilateral  sapheneous vein.;  Surgeon: Ivin Poot, MD;  Location: [place];  Service: Open Heart Surgery;  Laterality: N/A;  . KNEE SURGERY    . TEE WITHOUT CARDIOVERSION N/A 07/06/2015   Procedure: TRANSESOPHAGEAL ECHOCARDIOGRAM (TEE);  Surgeon: Ivin Poot, MD;  Location: [place];  Service: Open Heart Surgery;  Laterality: N/A;    Prior to Admission medications   Medication Sig Start Date End Date Taking? Authorizing Provider  aspirin EC 81 MG EC tablet Take 1 tablet (81 mg total) by mouth daily. 07/11/15  Yes Gold, Wilder Glade, PA-C  butalbital-acetaminophen-caffeine [place], ESGIC) [phone] MG tablet Take 1-2 tablets by mouth every 6 (six) hours as needed for headache. 11/29/16 11/29/17 Yes [name], MD  CALCIUM PO Take 1 tablet by mouth daily.   Yes [provider]  Cholecalciferol (VITAMIN D PO) Take 5,000 Units by mouth 2 (two) times daily.   Yes [provider]  cyanocobalamin 1000 MCG tablet Take 1,000 mcg by mouth daily.   Yes [provider]  diclofenac sodium (VOLTAREN) 1 % GEL Apply 1 application topically 3 (three) times daily. Ribs, back. Patient taking differently: Apply 1 application topically daily. Ribs, back. 05/03/16  Yes Meredith Staggers, MD  Flaxseed, Linseed, 1000 MG CAPS Take 1,300 mg by mouth daily.    Yes [provider]  gabapentin  (NEURONTIN) 100 MG capsule Take 200 mg by mouth 2 (two) times daily. Take one tablet in the morning and one tablet in the evening    Yes [provider]  Magnesium 250 MG TABS Take 500 mg by mouth daily.    Yes [provider]  multivitamin [facility]) per tablet Take 1 tablet by mouth daily.     Yes [provider]  fludrocortisone (FLORINEF) 0.1 MG tablet TAKE 2 TABLETS(200 MCG) BY MOUTH DAILY 05/02/17   Vicie Mutters, PA-C  omeprazole (PRILOSEC) 20 MG capsule TAKE 2 CAPSULES BY MOUTH EVERY DAY FOR INDIGESTION Patient not taking: Reported on 05/17/2017 02/14/17   [name], MD    Current Facility-Administered Medications  Medication Dose Route Frequency Provider Last Rate Last Dose  . 0.9 %  sodium chloride infusion  250 mL Intravenous PRN Emokpae, Courage, MD      . 0.9 %  sodium chloride infusion   Intravenous Continuous Roxan Hockey, MD 125 mL/hr at 05/18/17 0514    . acetaminophen (TYLENOL) tablet 650 mg  650 mg Oral Q6H PRN Emokpae, Courage, MD       Or  . acetaminophen (TYLENOL) suppository 650 mg  650 mg Rectal Q6H PRN Emokpae, Courage, MD      . albuterol (PROVENTIL) (2.5 MG/3ML) 0.083% nebulizer solution 2.5 mg  2.5 mg Nebulization Q2H PRN Emokpae, Courage, MD      . aspirin EC tablet 81 mg  81 mg Oral Daily Emokpae, Courage, MD   81 mg at 05/17/17 1323  . butalbital-acetaminophen-caffeine (FIORICET, ESGIC) 50-325-40 MG per tablet 1-2 tablet  1-2 tablet Oral Q6H PRN Denton Brick, Courage, MD      . calcium carbonate (TUMS - dosed in mg elemental calcium) chewable tablet 200 mg of elemental calcium  1 tablet Oral Daily Emokpae, Courage, MD   200 mg of elemental calcium at 05/17/17 1325  . cholecalciferol (VITAMIN D) tablet 5,000 Units  5,000 Units Oral BID Roxan Hockey, MD   5,000 Units at 05/17/17 1322  . famotidine (PEPCID) IVPB 20 mg premix  20 mg Intravenous Q12H Roxan Hockey, MD   Stopped at 05/18/17 1112  . fludrocortisone (FLORINEF) tablet  0.1 mg  0.1 mg Oral Daily Emokpae, Courage, MD   0.1 mg at 05/17/17 1322  . gabapentin (NEURONTIN) capsule 200 mg  200 mg Oral BID Emokpae, Courage, MD   200 mg at 05/17/17 1323  . heparin injection 5,000 Units  5,000 Units Subcutaneous Q8H Roxan Hockey, MD   5,000 Units at 05/18/17 0514  . HYDROmorphone (DILAUDID) injection 1 mg  1 mg Intravenous Q4H PRN Roxan Hockey, MD   1 mg at 05/17/17 1619  . iopamidol (ISOVUE-M) 61 % intrathecal injection 15 mL  15 mL Intrathecal Once PRN Emokpae, Courage, MD      . lactated ringers bolus 1,000 mL  1,000 mL Intravenous Once Ileana Roup, MD      . magnesium oxide (MAG-OX) tablet 400 mg  400 mg Oral Daily Emokpae, Courage, MD   400 mg at 05/17/17 1323  . morphine 4 MG/ML injection  2 mg  2 mg Intravenous Q4H PRN Minda Ditto, RPH      . multivitamin with minerals tablet 1 tablet  1 tablet Oral Daily Emokpae, Courage, MD   1 tablet at 05/17/17 1300  . ondansetron (ZOFRAN) injection 4 mg  4 mg Intravenous Q6H Emokpae, Courage, MD   4 mg at 05/18/17 1200  . oxyCODONE (Oxy IR/ROXICODONE) immediate release tablet 5 mg  5 mg Oral Q4H PRN Emokpae, Courage, MD      . piperacillin-tazobactam (ZOSYN) IVPB 3.375 g  3.375 g Intravenous Q8H Meuth, Brooke A, PA-C 12.5 mL/hr at 05/18/17 1200 3.375 g at 05/18/17 1200  . polyethylene glycol (MIRALAX / GLYCOLAX) packet 17 g  17 g Oral Daily PRN Emokpae, Courage, MD      . senna (SENOKOT) tablet 8.6 mg  1 tablet Oral BID Emokpae, Courage, MD   8.6 mg at 05/17/17 1300  . sodium chloride flush (NS) 0.9 % injection 3 mL  3 mL Intravenous Q12H Emokpae, Courage, MD   3 mL at 05/17/17 1306  . sodium chloride flush (NS) 0.9 % injection 3 mL  3 mL Intravenous PRN Emokpae, Courage, MD      . traZODone (DESYREL) tablet 50 mg  50 mg Oral QHS PRN Emokpae, Courage, MD      . vitamin B-12 (CYANOCOBALAMIN) tablet 1,000 mcg  1,000 mcg Oral Daily Emokpae, Courage, MD   1,000 mcg at 05/17/17 1325    Allergies as of 05/17/2017  - Review Complete 05/17/2017  Allergen Reaction Noted  . Cymbalta [duloxetine hcl]  03/18/2013  . Keflex [cephalexin] Other (See Comments) 03/18/2013  . Simvastatin Other (See Comments) 03/18/2013  . Sudafed [pseudoephedrine]  03/18/2013    Family History  Problem Relation Age of Onset  . Heart attack Father        died age 84  . Heart attack Brother        died age 61  . Heart attack Sister        died age 41  . Colon cancer Sister   . Liver cancer Sister   . Diabetes Maternal Grandmother   . Colon polyps Sister        and brothers x 2     Social History   Socioeconomic History  . Marital status: Married    Spouse name: Not on file  . Number of children: 4   . Years of education: Not on file  . Highest education level: Not on file  Social Needs  . Financial resource strain: Not on file  . Food insecurity - worry: Not on file  . Food insecurity - inability: Not on file  . Transportation needs - medical: Not on file  . Transportation needs - non-medical: Not on file  Occupational History  . Occupation: Retired  Tobacco Use  . Smoking status: Former Smoker    Last attempt to quit: 07/16/1963    Years since quitting: 53.8  . Smokeless tobacco: Never Used  Substance and Sexual Activity  . Alcohol use: No  . Drug use: No  . Sexual activity: Not Currently  Other Topics Concern  . Not on file  Social History Narrative   1 caffeine drink daily     Review of Systems: ROS is O/W negative except as mentioned in HPI.  Physical Exam: Vital signs in last 24 hours: Temp:  [97.8 F (36.6 C)-100.6 F (38.1 C)] 98.5 F (36.9 C) (01/17 0636) Pulse Rate:  [43-130] 94 (01/17 0636) Resp:  [  11-24] 16 (01/17 0636) BP: (106-191)/(66-99) 106/66 (01/17 0636) SpO2:  [91 %-100 %] 100 % (01/17 0636) Weight:  [188 lb (85.3 kg)] 188 lb (85.3 kg) (01/16 1900)   General:  Alert, Well-developed, well-nourished, pleasant and cooperative in NAD; NGT in place with minimal output. Head:   Normocephalic and atraumatic. Eyes:  Sclera clear, no icterus.  Conjunctiva pink. Ears:  Normal auditory acuity. Mouth:  No deformity or lesions.   Lungs:  Clear throughout to auscultation.  No wheezes, crackles, or rhonchi.  Heart:  Regular rate and rhythm; no murmurs, clicks, rubs,  or gallops. Abdomen:  Soft, non-distended.  I do not hear any bowel sounds yet at this point.  Appropriate TTP. Rectal:  Deferred  Msk:  Symmetrical without gross deformities. Pulses:  Normal pulses noted. Extremities:  Without clubbing or edema. Neurologic:  Alert and oriented x 4;  grossly normal neurologically. Skin:  Intact without significant lesions or rashes. Psych:  Alert and cooperative. Normal mood and affect.  Intake/Output from previous day: 01/16 0701 - 01/17 0700 In: 2972.9 [I.V.:2872.9; IV Piggyback:100] Out: 1150 [Urine:600; Blood:50]  Lab Results: Recent Labs    05/17/17 0710 05/18/17 0623  WBC 13.7* 15.6*  HGB 14.7 11.9*  HCT 44.0 37.1*  PLT 156 169   BMET Recent Labs    05/17/17 0710 05/18/17 0623  NA 138 141  K 3.8 4.7  CL 98* 108  CO2 27 25  GLUCOSE 175* 170*  BUN 29* 32*  CREATININE 1.36* 1.42*  CALCIUM 9.3 7.9*   LFT Recent Labs    05/18/17 0623  PROT 5.5*  ALBUMIN 2.8*  AST 26  ALT 13*  ALKPHOS 56  BILITOT 0.8   Studies/Results: Ct Abdomen Pelvis Wo Contrast  Result Date: 05/17/2017 CLINICAL DATA:  Acute right upper quadrant abdominal pain, nausea. EXAM: CT ABDOMEN AND PELVIS WITHOUT CONTRAST TECHNIQUE: Multidetector CT imaging of the abdomen and pelvis was performed following the standard protocol without IV contrast. COMPARISON:  CT scan of August 14, 2013. FINDINGS: Lower chest: No acute abnormality. Hepatobiliary: Mild cholelithiasis is noted. No focal abnormality seen in the liver on these unenhanced images. Pancreas: Unremarkable. No pancreatic ductal dilatation or surrounding inflammatory changes. Spleen: Normal in size without focal abnormality.  Adrenals/Urinary Tract: Adrenal glands are unremarkable. Kidneys are normal, without renal calculi, focal lesion, or hydronephrosis. Bladder is unremarkable. Stomach/Bowel: The stomach is unremarkable. No colonic dilatation is noted. There are several loops of severely dilated proximal small bowel seen in the right side of the abdomen, with nondilated bowel seen proximal and distal to this. Proximal transition zone is seen on image number 39 of series 2, with distal transition zone seen on same image. Possibility of volvulus or closed loop obstruction cannot be excluded. Alternatively, this may represent adhesions. Vascular/Lymphatic: 3.6 cm infrarenal abdominal aortic aneurysm is noted. No significant adenopathy is noted. 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 Reproductive: Mild prostatic enlargement is noted. Other: No abdominal wall hernia or abnormality. No abdominopelvic ascites. Musculoskeletal: No acute or significant osseous findings. IMPRESSION: Severely dilated loops of proximal small bowel are seen in the right side of the abdomen, with transition zone seen both proximally and distally to the dilated small bowel loops. Potentially this may be due to adhesions, but the possibility of volvulus or closed loop obstruction cannot be excluded. Critical Value/emergent results were called by telephone at the time of interpretation on 05/17/2017 at 4:36 pm to Dr. Venora Maples , who verbally acknowledged these results. Mild  cholelithiasis. 3.6 cm infrarenal abdominal aortic aneurysm. Recommend followup by ultrasound in 2 years. This recommendation follows ACR consensus guidelines: White Paper of the ACR Incidental Findings Committee II on Vascular Findings. J Am Coll Radiol 2013; 10:789-794. Electronically Signed   By: Marijo Conception, M.D.   On: 05/17/2017 16:36   US Abdomen Limited Ruq  Result Date: 05/17/2017 CLINICAL DATA:  Acute abdominal pain, hypertension EXAM: ULTRASOUND ABDOMEN  LIMITED RIGHT UPPER QUADRANT COMPARISON:  CT 08/14/2013 FINDINGS: Gallbladder: Gallstones measuring up to 0.7 cm. No gallbladder wall thickening or pericholecystic fluid. The gallbladder is incompletely distended. Common bile duct: Diameter: 4.4 mm, unremarkable Liver: No focal lesion identified. Within normal limits in parenchymal echogenicity. Portal vein is patent on color Doppler imaging with normal direction of blood flow towards the liver. There is a trace amount of perihepatic ascites. IMPRESSION: 1. Cholelithiasis without other ultrasound evidence of cholecystitis or biliary obstruction. 2. Trace perihepatic ascites. Electronically Signed   By: Lucrezia Europe M.D.   On: 05/17/2017 09:34   IMPRESSION:  *84 year old male who presented with acute abdominal pain with CT scan showing possible volvulus or closed loop obstruction.  POD #1 from diagnostic laparoscopy.  Only finding at the time of surgery was a segmental jejunal inflammatory process of uncertain etiology.  No resection was performed.  Unsure of the cause of this, but unlikely to be a new diagnosis of Crohn's disease at his age.  Could be having intermittent volvulus as they describe intermittent pain for a couple of years.    PLAN: *Will plan to do CT enterography in about 4 weeks when he is healed from surgery. *Continue with supportive measures per surgical recommendations.   Laban Emperor. Zehr  05/18/2017, 12:18 PM  Pager number [phone]   Attending physician's note   I have taken a history, examined the patient and reviewed the chart. I agree with the Advanced Practitioner's note, impression and recommendations. 84 year old male with history of CAD, status post CABG admitted with severe abdominal pain associated with nausea and vomiting.  He had remote abdominal surgery.  CT abdomen pelvis showed findings suggestive of possible small bowel volvulus versus loop obstruction.  He had ex lap this morning by [name], she noted segmental  jejunal inflammation otherwise was normal.  No bowel resection. Patient and family say he has been having chronic severe abdominal pain for past 1-2 years since the CABG. He is following with pain management and  getting intrathecal injections.  The abdominal pain to seems to be wandering to different locations according to him, this pain was similar but much more severe in nature. ?  Possible chronic intermittent volvulus of small bowel of unclear etiology. No evidence of crohn's disease. Likely mechanical in nature Based on location of segmental jejunitis, unlikely to reach with EGD or colonoscopy.   Will follow-up in office visit in 4-6 weeks, consider CT enterography vs small bowel video capsule to further evaluate We will be available should have any further questions or concerns  K Denzil Magnuson, MD [phone] Mon-Fri 8a-5p [phone] after 5p, weekends, holidays

## 2017-05-18 NOTE — Discharge Instructions (Addendum)
LAPAROSCOPIC SURGERY: POST OP INSTRUCTIONS  1. DIET: Follow a light bland diet the first 24 hours after arrival home, such as soup, liquids, crackers, etc. Be sure to include lots of fluids daily. Avoid fast food or heavy meals as your are more likely to get nauseated. Eat a low fat the next few days after surgery.  2. Take your usually prescribed home medications unless otherwise directed. 3. PAIN CONTROL:  1. Pain is best controlled by a usual combination of three different methods TOGETHER:  1. Ice/Heat 2. Over the counter pain medication 3. Prescription pain medication 2. Most patients will experience some swelling and bruising around the incisions. Ice packs or heating pads (30-60 minutes up to 6 times a day) will help. Use ice for the first few days to help decrease swelling and bruising, then switch to heat to help relax tight/sore spots and speed recovery. Some people prefer to use ice alone, heat alone, alternating between ice & heat. Experiment to what works for you. Swelling and bruising can take several weeks to resolve.  3. It is helpful to take an over-the-counter pain medication regularly for the first few weeks. Choose one of the following that works best for you:  1. Naproxen (Aleve, etc) Two 220mg  tabs twice a day 2. Ibuprofen (Advil, etc) Three 200mg  tabs four times a day (every meal & bedtime) 3. Acetaminophen (Tylenol, etc) 500-650mg  four times a day (every meal & bedtime) 4. A prescription for pain medication (such as oxycodone, hydrocodone, etc) should be given to you upon discharge. Take your pain medication as prescribed.  1. If you are having problems/concerns with the prescription medicine (does not control pain, nausea, vomiting, rash, itching, etc), please call us [phone] to see if we need to switch you to a different pain medicine that will work better for you and/or control your side effect better. 2. If you need a refill on your pain medication, please contact  your pharmacy. They will contact our office to request authorization. Prescriptions will not be filled after 5 pm or on week-ends. 4. Avoid getting constipated. Between the surgery and the pain medications, it is common to experience some constipation. Increasing fluid intake and taking a fiber supplement (such as Metamucil, Citrucel, FiberCon, MiraLax, etc) 1-2 times a day regularly will usually help prevent this problem from occurring. A mild laxative (prune juice, Milk of Magnesia, MiraLax, etc) should be taken according to package directions if there are no bowel movements after 48 hours.  5. Watch out for diarrhea. If you have many loose bowel movements, simplify your diet to bland foods & liquids for a few days. Stop any stool softeners and decrease your fiber supplement. Switching to mild anti-diarrheal medications (Kayopectate, Pepto Bismol) can help. If this worsens or does not improve, please call us. 6. Wash / shower every day. You may shower over the dressings as they are waterproof. Continue to shower over incision(s) after the dressing is off. If there is glue over the incisions try not to pick it off, let it fall off naturally. 7. Remove your waterproof bandages 2 days after surgery. You may leave the incision open to air. You may replace a dressing/Band-Aid to cover the incision for comfort if you wish.  8. ACTIVITIES as tolerated:  1. You may resume regular (light) daily activities beginning the next day--such as daily self-care, walking, climbing stairs--gradually increasing activities as tolerated. If you can walk 30 minutes without difficulty, it is safe to try more intense activity  such as jogging, treadmill, bicycling, low-impact aerobics, swimming, etc. 2. Save the most intensive and strenuous activity for last such as sit-ups, heavy lifting, contact sports, etc Refrain from any heavy lifting or straining until you are off narcotics for pain control. For the first 2-3 weeks do not lift  over 10-15lb.  3. DO NOT PUSH THROUGH PAIN. Let pain be your guide: If it hurts to do something, don't do it. Pain is your body warning you to avoid that activity for another week until the pain goes down. 4. You may drive when you are no longer taking prescription pain medication, you can comfortably wear a seatbelt, and you can safely maneuver your car and apply brakes. 5. You may have sexual intercourse when it is comfortable.  9. FOLLOW UP in our office  1. Please call [facility] at [phone] to set up an appointment to see your surgeon in the office for a follow-up appointment approximately 2-3 weeks after your surgery. 2. Make sure that you call for this appointment the day you arrive home to insure a convenient appointment time.      10. IF YOU HAVE DISABILITY OR FAMILY LEAVE FORMS, BRING THEM TO THE               OFFICE FOR PROCESSING.   WHEN TO CALL us [phone]:  1. Poor pain control 2. Reactions / problems with new medications (rash/itching, nausea, etc)  3. Fever over 101.5 F (38.5 C) 4. Inability to urinate 5. Nausea and/or vomiting 6. Worsening swelling or bruising 7. Continued bleeding from incision. 8. Increased pain, redness, or drainage from the incision  The clinic staff is available to answer your questions during regular business hours (8:30am-5pm). Please dont hesitate to call and ask to speak to one of our nurses for clinical concerns.  If you have a medical emergency, go to the nearest emergency room or call 911.  A surgeon from [facility] is always on call at the [facility], [place], [place], [place], [place] [address] ?  MAIN: [phone] ? TOLL FREE: [phone] ?  FAX [phone]  [email or web address]    EATING AFTER A SMALL BOWEL OBSTRUCTION   EAT Gradually transition to a high fiber diet with a fiber supplement over the next few days after discharge  WALK Walk an hour a  day.  Control your pain to do that.    CONTROL PAIN Control pain so that you can walk, sleep, tolerate sneezing/coughing, go up/down stairs.  HAVE A BOWEL MOVEMENT DAILY Keep your bowels regular to avoid problems.  OK to try a laxative to override constipation.  OK to use an antidairrheal to slow down diarrhea.  Call if not better after 2 tries  CALL IF YOU HAVE PROBLEMS/CONCERNS Call if you are still struggling despite following these instructions. Call if you have concerns not answered by these instructions     After your BOWEL OBSTRUCTION, expect some issues over the next few weeks.    To help you through this temporary phase, we start you out on a pureed (blenderized) diet.  Your first meal in the hospital was thin liquids.  You should have been given a pureed diet by the time you left the hospital.  We ask patients to stay on a pureed diet for the first few days to avoid anything getting "stuck."  Don't be alarmed if your ability to swallow doesn't progress according to this plan.  Everyone is different and some diets can advance more or less quickly.     Some BASIC RULES to follow are:  Maintain an upright position whenever eating or drinking.  Take small bites - just a teaspoon size bite at a time.  Eat slowly.  It may also help to eat only one food at a time.  Consider nibbling through smaller, more frequent meals & avoid the urge to eat BIG meals  Do not push through feelings of fullness, nausea, or bloatedness  Do not mix solid foods and liquids in the same mouthful  Try not to "wash foods down" with large gulps of liquids.  Avoid carbonated (bubbly/fizzy) drinks.    Avoid foods that make you feel gassy or bloated.  Start with bland foods first.  Wait on trying greasy, fried, or spicy meals until you are tolerating more bland solids well.  Expect to be more gassy/flatulent/bloated initially.  Walking will help your body manage it better.  Consider using  medications for bloating that contain simethicone such as  Maalox or Gas-X   Eat in a relaxed atmosphere & minimize distractions.  Avoid talking while eating.    Do not use straws.  Following each meal, sit in an upright position (90 degree angle) for 60 to 90 minutes.  Going for a short walk can help as well  If food does stick, don't panic.  Try to relax and let the food pass on its own.  Sipping WARM LIQUID such as strong hot black tea can also help slide it down.   Be gradual in changes & use common sense:  -If you easily tolerating a certain "level" of foods, advance to the next level gradually -If you are having trouble swallowing a particular food, then avoid it.   -If food is sticking when you advance your diet, go back to thinner previous diet (the lower LEVEL) for 1-2 days.  LEVEL 1 = PUREED DIET  Do for the first [place] in this group are pureed or blenderized to a smooth, mashed potato-like consistency.  -If necessary, the pureed foods can keep their shape with the addition of a thickening agent.   -Meat should be pureed to a smooth, pasty consistency.  Hot broth or gravy may be added to the pureed meat, approximately 1 oz. of liquid per 3 oz. serving of meat. -CAUTION:  If any foods do not puree into a smooth consistency, swallowing will be more difficult.  (For example, nuts or seeds sometimes do not blend well.)  Hot Foods Cold Foods  Pureed scrambled eggs and cheese Pureed cottage cheese  Baby cereals Thickened juices and nectars  Thinned cooked cereals (no lumps) Thickened milk or eggnog  Pureed [place] toast or pancakes Ensure  Mashed potatoes Ice cream  Pureed parsley, au gratin, scalloped potatoes, candied sweet potatoes Fruit or [place] ice, sherbet  Pureed buttered or alfredo noodles Plain yogurt  Pureed vegetables (no corn or peas) Instant breakfast  Pureed soups and creamed soups Smooth pudding, mousse, custard  Pureed  scalloped apples Whipped gelatin  Gravies Sugar, syrup, honey, jelly  Sauces, cheese, tomato, barbecue, white, creamed Cream  Any baby food Creamer  Alcohol in moderation (not beer or champagne) Margarine  Coffee or tea Mayonnaise   Ketchup, mustard   Apple sauce   SAMPLE MENU:  PUREED DIET Breakfast Lunch Dinner   Orange juice, 1/2 cup  Cream of wheat, 1/2 cup  Pineapple juice, 1/2 cup  Pureed [place], barley soup, 3/4 cup  Pureed Hawaiian chicken, 3 oz   Scrambled eggs, mashed or blended with cheese, 1/2 cup  Tea or coffee, 1 cup   Whole milk, 1 cup   Non-dairy creamer, 2 Tbsp.  Mashed potatoes, 1/2 cup  Pureed cooled broccoli, 1/2 cup  Apple sauce, 1/2 cup  Coffee or tea  Mashed potatoes, 1/2 cup  Pureed spinach, 1/2 cup  Frozen yogurt, 1/2 cup  Tea or coffee      LEVEL 2 = SOFT DIET  After your first few days, you can advance to a soft diet.   Keep on this diet until everything goes down easily.  Hot Foods Cold Foods  White fish Cottage cheese  Stuffed fish Junior baby fruit  Baby food meals Semi thickened juices  Minced soft cooked, scrambled, poached eggs nectars  Souffle & omelets Ripe mashed bananas  Cooked cereals Canned fruit, pineapple sauce, milk  potatoes Milkshake  Buttered or Alfredo noodles Custard  Cooked cooled vegetable Puddings, including tapioca  Sherbet Yogurt  Vegetable soup or alphabet soup Fruit ice, [place] ice  Gravies Whipped gelatin  Sugar, syrup, honey, jelly Junior baby desserts  Sauces:  Cheese, creamed, barbecue, tomato, white Cream  Coffee or tea Margarine   SAMPLE MENU:  LEVEL 2 Breakfast Lunch Dinner   Orange juice, 1/2 cup  Oatmeal, 1/2 cup  Scrambled eggs with cheese, 1/2 cup  Decaffeinated tea, 1 cup  Whole milk, 1 cup  Non-dairy creamer, 2 Tbsp  Pineapple juice, 1/2 cup  Minced beef, 3 oz  Gravy, 2 Tbsp  Mashed potatoes, 1/2 cup  Minced fresh broccoli, 1/2 cup  Applesauce, 1/2  cup  Coffee, 1 cup  [place], barley soup, 3/4 cup  Minced Hawaiian chicken, 3 oz  Mashed potatoes, 1/2 cup  Cooked spinach, 1/2 cup  Frozen yogurt, 1/2 cup  Non-dairy creamer, 2 Tbsp      LEVEL 3 = CHOPPED DIET  -After all the foods in level 2 (soft diet) are passing through well you should advance up to more chopped foods.  -It is still important to cut these foods into small pieces and eat slowly.  Hot Foods Cold Foods  Poultry Cottage cheese  Chopped Swedish meatballs Yogurt  Meat salads (ground or flaked meat) Milk  Flaked fish (tuna) Milkshakes  Poached or scrambled eggs Soft, cold, dry cereal  Souffles and omelets Fruit juices or nectars  Cooked cereals Chopped canned fruit  Chopped [place] toast or pancakes Canned fruit cocktail  Noodles or pasta (no rice) Pudding, mousse, custard  Cooked vegetables (no frozen peas, corn, or mixed vegetables) Green salad  Canned small sweet peas Ice cream  Creamed soup or vegetable soup Fruit ice, [place] ice  Pureed vegetable soup or alphabet soup Non-dairy creamer  Ground scalloped apples Margarine  Gravies Mayonnaise  Sauces:  Cheese, creamed, barbecue, tomato, white Ketchup  Coffee or tea Mustard   SAMPLE MENU:  LEVEL 3 Breakfast Lunch Dinner   Orange juice, 1/2 cup  Oatmeal, 1/2 cup  Scrambled eggs with cheese, 1/2 cup  Decaffeinated tea, 1 cup  Whole milk, 1 cup  Non-dairy creamer, 2 Tbsp  Ketchup, 1 Tbsp  Margarine, 1 tsp  Salt, 1/4 tsp  Sugar, 2 tsp  Pineapple juice, 1/2 cup  Ground beef, 3 oz  Gravy, 2 Tbsp  Mashed potatoes, 1/2 cup  Cooked spinach, 1/2 cup  Applesauce, 1/2 cup  Decaffeinated coffee  Whole milk  Non-dairy creamer, 2 Tbsp  Margarine, 1 tsp  Salt, 1/4 tsp  Pureed [place], barley soup, 3/4 cup  Barbecue chicken, 3 oz  Mashed potatoes, 1/2 cup  Ground fresh broccoli, 1/2 cup  Frozen yogurt, 1/2 cup  Decaffeinated tea, 1 cup  Non-dairy creamer, 2  Tbsp  Margarine, 1 tsp  Salt, 1/4 tsp  Sugar, 1 tsp    LEVEL 4:  REGULAR FOODS  -Foods in this group are soft, moist, regularly textured foods.   -This level includes meat and breads, which tend to be the hardest things to swallow.   -Eat very slowly, chew well and continue to avoid carbonated drinks. -most people are at this level in 2-4 weeks  Hot Foods Cold Foods  Baked fish or skinned Soft cheeses - cottage cheese  Souffles and omelets Cream cheese  Eggs Yogurt  Stuffed shells Milk  Spaghetti with meat sauce Milkshakes  Cooked cereal Cold dry cereals (no nuts, dried fruit, coconut)  [place] toast or pancakes Crackers  Buttered toast Fruit juices or nectars  Noodles or pasta (no rice) Canned fruit  Potatoes (all types) Ripe bananas  Soft, cooked vegetables (no corn, lima, or baked beans) Peeled, ripe, fresh fruit  Creamed soups or vegetable soup Cakes (no nuts, dried fruit, coconut)  Canned chicken noodle soup Plain doughnuts  Gravies Ice cream  Bacon dressing Pudding, mousse, custard  Sauces:  Cheese, creamed, barbecue, tomato, white Fruit ice, [place] ice, sherbet  Decaffeinated tea or coffee Whipped gelatin  Pork chops Regular gelatin   Canned fruited gelatin molds   Sugar, syrup, honey, jam, jelly   Cream   Non-dairy   Margarine   Oil   Mayonnaise   Ketchup   Mustard   TROUBLESHOOTING IRREGULAR BOWELS  1) Avoid extremes of bowel movements (no bad constipation/diarrhea)  2) Miralax 17gm mixed in 8oz. water or juice-daily. May use BID as needed.  3) Gas-x,Phazyme, etc. as needed for gas & bloating.  4) Soft,bland diet. No spicy,greasy,fried foods.  5) Prilosec over-the-counter as needed  6) May hold gluten/wheat products from diet to see if symptoms improve.  7) May try probiotics (Align, Activa, etc) to help calm the bowels down  7) If symptoms become worse call back immediately.    If you have any questions please call our office at [place]: [phone].   Partial Small Bowel Obstruction from inflamed jejunum  A small bowel obstruction is a blockage in the small bowel. The small bowel, which is also called the small intestine, is a long, slender tube that connects the stomach to the colon. When a person eats and drinks, food and fluids go from the stomach to the small bowel. This is where most of the nutrients in the food and fluids are absorbed. A small bowel obstruction will prevent food and fluids from passing through the small bowel as they normally do during digestion. The small bowel can become partially or completely blocked. This can cause symptoms such as abdominal pain, vomiting, and bloating. If this condition is not treated, it can be dangerous because the small bowel could rupture. CAUSES Common causes of this condition include: 2. Scar tissue from previous surgery or radiation treatment. 3. Recent surgery. This may cause the movements of the bowel to slow down and cause food to block the intestine. 4. Hernias. 5. Inflammatory bowel disease (colitis). 6. Twisting of the bowel (volvulus). 7. Tumors. 8. A foreign body. 9. Slipping of a part of the bowel into another part (intussusception). SYMPTOMS Symptoms of this condition include: 2. Abdominal  pain. This may be dull cramps or sharp pain. It may occur in one area, or it may be present in the entire abdomen. Pain can range from mild to severe, depending on the degree of obstruction. 3. Nausea and vomiting. Vomit may be greenish or a yellow bile color. 4. Abdominal bloating. 5. Constipation. 6. Lack of passing gas. 7. Frequent belching. 8. Diarrhea. This may occur if the obstruction is partial and runny stool is able to leak around the obstruction. DIAGNOSIS This condition may be diagnosed based on a physical exam, medical history, and X-rays of the abdomen. You may also have other tests, such as a CT scan of the abdomen and pelvis. TREATMENT Treatment  for this condition depends on the cause and severity of the problem. Treatment options may include: 2. Bed rest along with fluids and pain medicines that are given through an IV tube inserted into one of your veins. Sometimes, this is all that is needed for the obstruction to improve. 3. Following a simple diet. In some cases, a clear liquid diet may be required for several days. This allows the bowel to rest. 4. Placement of a small tube (nasogastric tube) into the stomach. When the bowel is blocked, it usually swells up like a balloon that is filled with air and fluids. The air and fluids may be removed by suction through the nasogastric tube. This can help with pain, discomfort, and nausea. It can also help the obstruction to clear up faster. 5. Surgery. This may be required if other treatments do not work. Bowel obstruction from a hernia may require early surgery and can be an emergency procedure. Surgery may also be required for scar tissue that causes frequent or severe obstructions. HOME CARE INSTRUCTIONS  Get plenty of rest.  Follow instructions from your health care provider about eating restrictions. You may need to avoid solid foods and consume only clear liquids until your condition improves.  Take over-the-counter and prescription medicines only as told by your health care provider.  Keep all follow-up visits as told by your health care provider. This is important. SEEK MEDICAL CARE IF: 2. You have a fever. 3. You have chills. SEEK IMMEDIATE MEDICAL CARE IF: 2. You have increased pain or cramping. 3. You vomit blood. 4. You have uncontrolled vomiting or nausea. 5. You cannot drink fluids because of vomiting or pain. 6. You develop confusion. 7. You begin feeling very dry or thirsty (dehydrated). 8. You have severe bloating. 9. You feel extremely weak or you faint.   This information is not intended to replace advice given to you by your health care provider. Make sure you  discuss any questions you have with your health care provider.

## 2017-05-19 LAB — CBC
HCT: 38 % — ABNORMAL LOW (ref 39.0–52.0)
Hemoglobin: 12 g/dL — ABNORMAL LOW (ref 13.0–17.0)
MCH: 29.9 pg (ref 26.0–34.0)
MCHC: 31.6 g/dL (ref 30.0–36.0)
MCV: 94.5 fL (ref 78.0–100.0)
Platelets: 153 10*3/uL (ref 150–400)
RBC: 4.02 MIL/uL — ABNORMAL LOW (ref 4.22–5.81)
RDW: 15.6 % — ABNORMAL HIGH (ref 11.5–15.5)
WBC: 13.7 10*3/uL — ABNORMAL HIGH (ref 4.0–10.5)

## 2017-05-19 LAB — BASIC METABOLIC PANEL
Anion gap: 7 (ref 5–15)
BUN: 26 mg/dL — ABNORMAL HIGH (ref 6–20)
CO2: 26 mmol/L (ref 22–32)
Calcium: 8.3 mg/dL — ABNORMAL LOW (ref 8.9–10.3)
Chloride: 108 mmol/L (ref 101–111)
Creatinine, Ser: 1.21 mg/dL (ref 0.61–1.24)
GFR calc Af Amer: >60 mL/min (ref >60)
GFR calc non Af Amer: 54 mL/min — ABNORMAL LOW (ref >60)
Glucose, Bld: 121 mg/dL — ABNORMAL HIGH (ref 65–99)
Potassium: 3.9 mmol/L (ref 3.5–5.1)
Sodium: 141 mmol/L (ref 135–145)

## 2017-05-19 NOTE — Progress Notes (Signed)
[name] Progress Note  2 Days Post-Op  Subjective: CC-  Sitting up in chair. Main complaint today is back pain. Abdominal pain improving. NG tube removed this morning. Denies n/v. Passing some flatus, no BM. Ambulated well yesterday.  Objective: Vital signs in last 24 hours: Temp:  [97.8 F (36.6 C)-99.5 F (37.5 C)] 97.8 F (36.6 C) (01/18 0630) Pulse Rate:  [61-98] 73 (01/18 0630) Resp:  [16] 16 (01/18 0630) BP: (151-167)/(81-87) 166/81 (01/18 0630) SpO2:  [91 %-98 %] 98 % (01/18 0630)    Intake/Output from previous day: 01/17 0701 - 01/18 0700 In: 3164.6 [I.V.:2914.6; IV Piggyback:250] Out: 800 [Urine:700; Emesis/NG output:100] Intake/Output this shift: No intake/output data recorded.  PE: Gen:  Alert, NAD, pleasant HEENT: EOM's intact, pupils equal and round Card:  RRR, no M/G/R heard Pulm:  effort normal Abd: Soft, mild distension, + BS, multiple lap incisions C/D/I, TTP proximal and to the right of the umbilicus without rebound  Ext:  Calves soft and nontender Psych: A&Ox3  Skin: no rashes noted, warm and dry  Lab Results:  Recent Labs    05/18/17 0623 05/19/17 0553  WBC 15.6* 13.7*  HGB 11.9* 12.0*  HCT 37.1* 38.0*  PLT 169 153   BMET Recent Labs    05/18/17 0623 05/19/17 0553  NA 141 141  K 4.7 3.9  CL 108 108  CO2 25 26  GLUCOSE 170* 121*  BUN 32* 26*  CREATININE 1.42* 1.21  CALCIUM 7.9* 8.3*   PT/INR No results for input(s): LABPROT, INR in the last 72 hours. CMP     Component Value Date/Time   NA 141 05/19/2017 0553   K 3.9 05/19/2017 0553   CL 108 05/19/2017 0553   CO2 26 05/19/2017 0553   GLUCOSE 121 (H) 05/19/2017 0553   BUN 26 (H) 05/19/2017 0553   CREATININE 1.21 05/19/2017 0553   CREATININE 1.20 (H) 04/04/2017 1138   CALCIUM 8.3 (L) 05/19/2017 0553   PROT 5.5 (L) 05/18/2017 0623   ALBUMIN 2.8 (L) 05/18/2017 0623   AST 26 05/18/2017 0623   ALT 13 (L) 05/18/2017 0623   ALKPHOS 56 05/18/2017 0623    BILITOT 0.8 05/18/2017 0623   GFRNONAA 54 (L) 05/19/2017 0553   GFRNONAA 56 (L) 04/04/2017 1138   GFRAA >60 05/19/2017 0553   GFRAA 65 04/04/2017 1138   Lipase     Component Value Date/Time   LIPASE 18 05/18/2017 0623       Studies/Results: Ct Abdomen Pelvis Wo Contrast  Result Date: 05/17/2017 CLINICAL DATA:  Acute right upper quadrant abdominal pain, nausea. EXAM: CT ABDOMEN AND PELVIS WITHOUT CONTRAST TECHNIQUE: Multidetector CT imaging of the abdomen and pelvis was performed following the standard protocol without IV contrast. COMPARISON:  CT scan of August 14, 2013. FINDINGS: Lower chest: No acute abnormality. Hepatobiliary: Mild cholelithiasis is noted. No focal abnormality seen in the liver on these unenhanced images. Pancreas: Unremarkable. No pancreatic ductal dilatation or surrounding inflammatory changes. Spleen: Normal in size without focal abnormality. Adrenals/Urinary Tract: Adrenal glands are unremarkable. Kidneys are normal, without renal calculi, focal lesion, or hydronephrosis. Bladder is unremarkable. Stomach/Bowel: The stomach is unremarkable. No colonic dilatation is noted. There are several loops of severely dilated proximal small bowel seen in the right side of the abdomen, with nondilated bowel seen proximal and distal to this. Proximal transition zone is seen on image number 39 of series 2, with distal transition zone seen on same image. Possibility of volvulus or closed loop obstruction cannot be  excluded. Alternatively, this may represent adhesions. Vascular/Lymphatic: 3.6 cm infrarenal abdominal aortic aneurysm is noted. No significant adenopathy is noted. 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 Reproductive: Mild prostatic enlargement is noted. Other: No abdominal wall hernia or abnormality. No abdominopelvic ascites. Musculoskeletal: No acute or significant osseous findings. IMPRESSION: Severely dilated loops of proximal small bowel are seen  in the right side of the abdomen, with transition zone seen both proximally and distally to the dilated small bowel loops. Potentially this may be due to adhesions, but the possibility of volvulus or closed loop obstruction cannot be excluded. Critical Value/emergent results were called by telephone at the time of interpretation on 05/17/2017 at 4:36 pm to Dr. Venora Maples , who verbally acknowledged these results. Mild cholelithiasis. 3.6 cm infrarenal abdominal aortic aneurysm. Recommend followup by ultrasound in 2 years. This recommendation follows ACR consensus guidelines: White Paper of the ACR Incidental Findings Committee II on Vascular Findings. J Am Coll Radiol 2013; 10:789-794. Electronically Signed   By: Marijo Conception, M.D.   On: 05/17/2017 16:36    Anti-infectives: Anti-infectives (From admission, onward)   Start     Dose/Rate Route Frequency Ordered Stop   05/18/17 1100  piperacillin-tazobactam (ZOSYN) IVPB 3.375 g     3.375 g 12.5 mL/hr over 240 Minutes Intravenous Every 8 hours 05/18/17 1034     05/18/17 0945  piperacillin-tazobactam (ZOSYN) IVPB 3.375 g  Status:  Discontinued     3.375 g 100 mL/hr over 30 Minutes Intravenous  Once 05/18/17 0940 05/18/17 1034   05/17/17 1845  clindamycin (CLEOCIN) IVPB 900 mg     900 mg 100 mL/hr over 30 Minutes Intravenous On call to O.R. 05/17/17 1840 05/17/17 1937   05/17/17 1700  piperacillin-tazobactam (ZOSYN) IVPB 3.375 g  Status:  Discontinued     3.375 g 12.5 mL/hr over 240 Minutes Intravenous Every 8 hours 05/17/17 1112 05/17/17 1717   05/17/17 0830  piperacillin-tazobactam (ZOSYN) IVPB 3.375 g     3.375 g 100 mL/hr over 30 Minutes Intravenous  Once 05/17/17 0816 05/17/17 1006       Assessment/Plan CAD-status post PTCA x2/status post CABG Diabetes with diabetic neuropathy. History of tobacco use IBS/chronic constipation Hypertension AKI - Cr 1.21, resolved  Long segment jejunititis, preop concern for closed loop bowel  obstruction S/p DIAGNOSTIC LAPAROSCOPY 1/16 [name] - POD 2 - no obstruction seen intraoperatively, found to have a long segment of jejunitis, concern for possible IBD - appreciate GI consult, planning outpatient follow up in 4-6 weeks, possible CT enterography vs small bowel video capsule - WBC trending down 13.7, TMAX 99.5. Passing flatus, no BM. D/c NG tube and give clear liquids today. Continue ambulation/IS today. Continue IV zosyn.  ID - zosyn 1/16>>day#3, clindamycin preop FEN - IVF, clear liquids VTE - SCDs, heparin Foley - out Follow up - [name], GI   LOS: 2 days    Wellington Hampshire , [facility] 05/19/2017, 9:26 AM Pager: [phone] Consults: [phone] Mon-Fri 7:00 am-4:30 pm Sat-Sun 7:00 am-11:30 am

## 2017-05-19 NOTE — Progress Notes (Addendum)
[facility]  PROGRESS NOTE  [name] YNW:[id number] DOB: November 09, 1933 DOA: 05/17/2017 PCP: [name], MD   Brief HPI:   84 y.o. male with past medical history relevant for previous coronary artery disease/CABG, history of orthostatic hypotension woke up around 3 AM with severe abdominal pain worse in the right upper quadrant, no fevers no chills, initially patient had nausea without vomiting, however in the ED here after morphine sulfate, patient had persistent emesis, despite IV Zofran and rectal Compazine patient vomited again after drinking oral contrast.  CT abdomen and pelvis was ordered which showed possible closed loop obstruction (please see Full CT report above), who case again discussed with surgical team Dr Leighton Ruff, Pt going to OR on 05/07/17.     Subjective   Patient seen and examined, NG tube out. Patient started on diet per surgery. One day was consulted   Assessment/Plan:     1. Long segment jejunitis-patient underwent diagnostic laparoscopy and no bowel obstruction was found. Long segment tendinitis was discovered on laparoscopy, G.I. Was consulted and recommend CT enterography vs small bowel video capsule study as outpatient. Patient to follow up Dr Joylene Igo in 4-6 weeks. Patient started on soft diet, if tolerates, he can be discharged in am.  2. Cholelithiasis-incidental finding, no intervention needed at this time.  3. Intractable nausea vomiting-secondary to above. Continue antiemetics PRN.  4. History of CAD/prior CABG-no chest pain. Continue aspirin  5. Acute kidney injury-  Resolved,  patient's BUN/Cr 26/1.32 today.    DVT prophylaxis: SCD's  Code Status: full code  Family Communication: discussed with patient's wife at bedside  Disposition Plan: likely home in one to two days   Consultants:  general surgery  Procedures:  diagnostic laparoscopy  Continuous infusions . sodium chloride    . sodium chloride 75 mL/hr at  05/19/17 0842  . famotidine (PEPCID) IV Stopped (05/19/17 1018)  . lactated ringers    . piperacillin-tazobactam (ZOSYN)  IV 3.375 g (05/19/17 1045)      Antibiotics:   Anti-infectives (From admission, onward)   Start     Dose/Rate Route Frequency Ordered Stop   05/18/17 1100  piperacillin-tazobactam (ZOSYN) IVPB 3.375 g     3.375 g 12.5 mL/hr over 240 Minutes Intravenous Every 8 hours 05/18/17 1034     05/18/17 0945  piperacillin-tazobactam (ZOSYN) IVPB 3.375 g  Status:  Discontinued     3.375 g 100 mL/hr over 30 Minutes Intravenous  Once 05/18/17 0940 05/18/17 1034   05/17/17 1845  clindamycin (CLEOCIN) IVPB 900 mg     900 mg 100 mL/hr over 30 Minutes Intravenous On call to O.R. 05/17/17 1840 05/17/17 1937   05/17/17 1700  piperacillin-tazobactam (ZOSYN) IVPB 3.375 g  Status:  Discontinued     3.375 g 12.5 mL/hr over 240 Minutes Intravenous Every 8 hours 05/17/17 1112 05/17/17 1717   05/17/17 0830  piperacillin-tazobactam (ZOSYN) IVPB 3.375 g     3.375 g 100 mL/hr over 30 Minutes Intravenous  Once 05/17/17 0816 05/17/17 1006       Objective   Vitals:   05/18/17 0636 05/18/17 1412 05/18/17 2111 05/19/17 0630  BP: 106/66 (!) 167/87 (!) 151/83 (!) 166/81  Pulse: 94 61 98 73  Resp: 16 16 16 16   Temp: 98.5 F (36.9 C) 99.5 F (37.5 C) 99.5 F (37.5 C) 97.8 F (36.6 C)  TempSrc: Oral Oral Oral Oral  SpO2: 100% 91% 96% 98%  Weight:      Height:  Intake/Output Summary (Last 24 hours) at 05/19/2017 1343 Last data filed at 05/19/2017 0400 Gross per 24 hour  Intake 3164.58 ml  Output 300 ml  Net 2864.58 ml   Filed Weights   05/17/17 1900  Weight: 85.3 kg (188 lb)     Physical Examination:   Physical Exam: Eyes: No icterus, extraocular muscles intact  Mouth: Oral mucosa is moist, no lesions on palate,  Neck: Supple, no deformities, masses, or tenderness Lungs: Normal respiratory effort, bilateral clear to auscultation, no crackles or wheezes.  Heart:  Regular rate and rhythm, S1 and S2 normal, no murmurs, rubs auscultated Abdomen: BS normoactive,soft,nondistended,non-tender to palpation,no organomegaly Extremities: No pretibial edema, no erythema, no cyanosis, no clubbing Neuro : Alert and oriented to time, place and person, No focal deficits Skin: No rashes seen on exam      Data Reviewed: I have personally reviewed following labs and imaging studies  CBG: Recent Labs  Lab 05/17/17 2103  GLUCAP 145*    CBC: Recent Labs  Lab 05/17/17 0710 05/18/17 0623 05/19/17 0553  WBC 13.7* 15.6* 13.7*  HGB 14.7 11.9* 12.0*  HCT 44.0 37.1* 38.0*  MCV 91.5 93.7 94.5  PLT 156 169 623    Basic Metabolic Panel: Recent Labs  Lab 05/17/17 0710 05/18/17 0623 05/19/17 0553  NA 138 141 141  K 3.8 4.7 3.9  CL 98* 108 108  CO2 27 25 26   GLUCOSE 175* 170* 121*  BUN 29* 32* 26*  CREATININE 1.36* 1.42* 1.21  CALCIUM 9.3 7.9* 8.3*    No results found for this or any previous visit (from the past 240 hour(s)).   Liver Function Tests: Recent Labs  Lab 05/17/17 0710 05/18/17 0623  AST 26 26  ALT 19 13*  ALKPHOS 80 56  BILITOT 0.9 0.8  PROT 6.9 5.5*  ALBUMIN 3.7 2.8*   Recent Labs  Lab 05/17/17 0710 05/18/17 0623  LIPASE 31 18   No results for input(s): AMMONIA in the last 168 hours.     Studies: Ct Abdomen Pelvis Wo Contrast  Result Date: 05/17/2017 CLINICAL DATA:  Acute right upper quadrant abdominal pain, nausea. EXAM: CT ABDOMEN AND PELVIS WITHOUT CONTRAST TECHNIQUE: Multidetector CT imaging of the abdomen and pelvis was performed following the standard protocol without IV contrast. COMPARISON:  CT scan of August 14, 2013. FINDINGS: Lower chest: No acute abnormality. Hepatobiliary: Mild cholelithiasis is noted. No focal abnormality seen in the liver on these unenhanced images. Pancreas: Unremarkable. No pancreatic ductal dilatation or surrounding inflammatory changes. Spleen: Normal in size without focal abnormality.  Adrenals/Urinary Tract: Adrenal glands are unremarkable. Kidneys are normal, without renal calculi, focal lesion, or hydronephrosis. Bladder is unremarkable. Stomach/Bowel: The stomach is unremarkable. No colonic dilatation is noted. There are several loops of severely dilated proximal small bowel seen in the right side of the abdomen, with nondilated bowel seen proximal and distal to this. Proximal transition zone is seen on image number 39 of series 2, with distal transition zone seen on same image. Possibility of volvulus or closed loop obstruction cannot be excluded. Alternatively, this may represent adhesions. Vascular/Lymphatic: 3.6 cm infrarenal abdominal aortic aneurysm is noted. No significant adenopathy is noted. 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 Reproductive: Mild prostatic enlargement is noted. Other: No abdominal wall hernia or abnormality. No abdominopelvic ascites. Musculoskeletal: No acute or significant osseous findings. IMPRESSION: Severely dilated loops of proximal small bowel are seen in the right side of the abdomen, with transition zone seen both  proximally and distally to the dilated small bowel loops. Potentially this may be due to adhesions, but the possibility of volvulus or closed loop obstruction cannot be excluded. Critical Value/emergent results were called by telephone at the time of interpretation on 05/17/2017 at 4:36 pm to Dr. Venora Maples , who verbally acknowledged these results. Mild cholelithiasis. 3.6 cm infrarenal abdominal aortic aneurysm. Recommend followup by ultrasound in 2 years. This recommendation follows ACR consensus guidelines: White Paper of the ACR Incidental Findings Committee II on Vascular Findings. J Am Coll Radiol 2013; 10:789-794. Electronically Signed   By: Marijo Conception, M.D.   On: 05/17/2017 16:36    Scheduled Meds: . aspirin EC  81 mg Oral Daily  . calcium carbonate  1 tablet Oral Daily  . cholecalciferol  5,000 Units  Oral BID  . fludrocortisone  0.2 mg Oral Daily  . gabapentin  200 mg Oral BID  . heparin  5,000 Units Subcutaneous Q8H  . magnesium oxide  400 mg Oral Daily  . multivitamin with minerals  1 tablet Oral Daily  . ondansetron (ZOFRAN) IV  4 mg Intravenous Q6H  . senna  1 tablet Oral BID  . sodium chloride flush  3 mL Intravenous Q12H  . cyanocobalamin  1,000 mcg Oral Daily      Time spent: 20 min  [facility] Pager [phone]. If 7PM-7AM, please contact night-coverage at [email or web address], Office  [phone]  password [email or web address]  05/19/2017, 1:43 PM  LOS: 2 days

## 2017-05-20 DIAGNOSIS — N179 Acute kidney failure, unspecified: Secondary | ICD-10-CM

## 2017-05-20 DIAGNOSIS — K529 Noninfective gastroenteritis and colitis, unspecified: Principal | ICD-10-CM

## 2017-05-20 DIAGNOSIS — K8020 Calculus of gallbladder without cholecystitis without obstruction: Secondary | ICD-10-CM

## 2017-05-20 DIAGNOSIS — I25110 Atherosclerotic heart disease of native coronary artery with unstable angina pectoris: Secondary | ICD-10-CM

## 2017-05-20 HISTORY — DX: Noninfective gastroenteritis and colitis, unspecified: K52.9

## 2017-05-20 MED ORDER — FAMOTIDINE 20 MG PO TABS
20.0000 mg | ORAL_TABLET | Freq: Two times a day (BID) | ORAL | Status: DC
  Administered 2017-05-20 – 2017-05-21 (×3): 20 mg via ORAL
  Filled 2017-05-20 (×3): qty 1

## 2017-05-20 MED ORDER — SODIUM CHLORIDE 0.9 % IV SOLN: 250.0000 mL | INTRAVENOUS | Status: DC | PRN

## 2017-05-20 MED ORDER — BISACODYL 10 MG RE SUPP: 10.0000 mg | Freq: Two times a day (BID) | RECTAL | Status: DC | PRN

## 2017-05-20 MED ORDER — SODIUM CHLORIDE 0.9% FLUSH: 3.0000 mL | INTRAVENOUS | Status: DC | PRN

## 2017-05-20 MED ORDER — POLYETHYLENE GLYCOL 3350 17 G PO PACK
17.0000 g | PACK | Freq: Every day | ORAL | Status: DC
  Administered 2017-05-20 – 2017-05-21 (×2): 17 g via ORAL
  Filled 2017-05-20 (×2): qty 1

## 2017-05-20 MED ORDER — SODIUM CHLORIDE 0.9% FLUSH: 3.0000 mL | Freq: Two times a day (BID) | INTRAVENOUS | Status: DC

## 2017-05-20 NOTE — Progress Notes (Signed)
[facility]  [address] [place], [place] [address] Phone: [phone]  FAX: [phone]      [name] [id number] Sep 17, 1933  CARE TEAM:  PCP: [name], MD  Outpatient Care Team: Patient Care Team: [name], MD as PCP - General Rosalyn Gess, Doreene Burke, PA-C as Physician Assistant (Cardiology) Stanford Breed Denice Bors, MD as Consulting Physician (Cardiology) Irene Shipper, MD as Consulting Physician (Gastroenterology)  Inpatient Treatment Team: Treatment Team: Attending Provider: Barton Dubois, MD; Consulting Physician: Edison Pace, Md, MD; Registered Nurse: Lolita Rieger, RN; Rounding Team: Ian Bushman, MD; Technician: Virgia Land, [place]; Technician: Lucila Maine, NT; Registered Nurse: Barrington Ellison, RN; Technician: Rutherford Guys, NT   Problem List:   Principal Problem:   SBO (small bowel obstruction)/??? Volvulus Active Problems:   Coronary atherosclerosis- s/p PCI to LAD in 2009 and PCI to RCA in 2011   Orthostatic hypotension   Diabetes mellitus type 2, diet-controlled ([place])   Cholelithiasis   Sinus Bradycardia   3 Days Post-Op  05/17/2017  Procedure(s): DIAGNOSTIC LAPAROSCOPY,   Assessment  Partial obstruction from inflamed jejunum slowly resolving.  S/pDIAGNOSTIC LAPAROSCOPY1/16 [name] -no obstruction seen intraoperatively, found to have a long segment of jejunitis, concern for possible IBD  Plan:  CAD-status post PTCA x2 / status post CABG Diabetes with diabetic neuropathy. History of tobacco use IBS/chronic constipation Hypertension AKI - Cr 1.21, resolved ID -zosyn 1/16>>day#3,clindamycin preop FEN -IVF, clear liquids VTE -SCDs, heparin Foley -out Follow up -[name], GI  Tolerating clear liquids.  Advance to pured and then soft diet as tolerated.  Can be discharged and transition to close gastroenterology follow-up when meets goals.  Discussed with primary  service, Dr. Dyann Kief  20 minutes spent in review, evaluation, examination, counseling, and coordination of care.  More than 50% of that time was spent in counseling.  Adin Hector, M.D., F.A.C.S. Gastrointestinal and Minimally Invasive Surgery [facility] [address], [place], [place] [address] [phone] Main / Paging   05/20/2017    Subjective: (Chief complaint)  Feeling better.  Lots of flatus.  Wife at bedside.  Tolerating clear liquids.  Wants to eat more.  Hoping to go home soon.  Objective:  Vital signs:  Vitals:   05/19/17 0630 05/19/17 1518 05/19/17 2050 05/20/17 0538  BP: (!) 166/81 137/75 (!) 167/83 128/69  Pulse: 73 84 77 87  Resp: _0 Temp: 97.8 F (36.6 C) 97.6 F (36.4 C) 98.1 F (36.7 C) 98.2 F (36.8 C)  TempSrc: Oral Oral Oral Oral  SpO2: 98% 98% 97% 93%  Weight:      Height:           Intake/Output   Yesterday:  01/18 0701 - 01/19 0700 In: 2915 [P.O.:480; I.V.:2185; IV Piggyback:250] Out: 2600 [Urine:2600] This shift:  No intake/output data recorded.  Bowel function:  Flatus: YES  BM:  No  Drain: (No drain)   Physical Exam:  General: Pt awake/alert/oriented x4 in no acute distress Eyes: PERRL, normal EOM.  Sclera clear.  No icterus Neuro: CN II-XII intact w/o focal sensory/motor deficits. Lymph: No head/neck/groin lymphadenopathy Psych:  No delerium/psychosis/paranoia HENT: Normocephalic, Mucus membranes moist.  No thrush Neck: Supple, No tracheal deviation Chest: No chest wall pain w good excursion CV:  Pulses intact.  Regular rhythm MS: Normal AROM mjr joints.  No obvious deformity  Abdomen: Somewhat firm.  Mildy distended.  Nontender.  No evidence  of peritonitis.  No incarcerated hernias.  Ext:  No deformity.  No mjr edema.  No cyanosis Skin: No petechiae / purpura  Results:   Labs: Results for orders placed or performed during the hospital encounter of  05/17/17 (from the past 48 hour(s))  CBC     Status: Abnormal   Collection Time: 05/19/17  5:53 AM  Result Value Ref Range   WBC 13.7 (H) 4.0 - 10.5 K/uL   RBC 4.02 (L) 4.22 - 5.81 MIL/uL   Hemoglobin 12.0 (L) 13.0 - 17.0 g/dL   HCT 38.0 (L) 39.0 - 52.0 %   MCV 94.5 78.0 - 100.0 fL   MCH 29.9 26.0 - 34.0 pg   MCHC 31.6 30.0 - 36.0 g/dL   RDW 15.6 (H) 11.5 - 15.5 %   Platelets 153 150 - 400 K/uL  Basic metabolic panel     Status: Abnormal   Collection Time: 05/19/17  5:53 AM  Result Value Ref Range   Sodium 141 135 - 145 mmol/L   Potassium 3.9 3.5 - 5.1 mmol/L    Comment: DELTA CHECK NOTED REPEATED TO VERIFY    Chloride 108 101 - 111 mmol/L   CO2 26 22 - 32 mmol/L   Glucose, Bld 121 (H) 65 - 99 mg/dL   BUN 26 (H) 6 - 20 mg/dL   Creatinine, Ser 1.21 0.61 - 1.24 mg/dL   Calcium 8.3 (L) 8.9 - 10.3 mg/dL   GFR calc non Af Amer 54 (L) >60 mL/min   GFR calc Af Amer >60 >60 mL/min    Comment: (NOTE) The eGFR has been calculated using the CKD EPI equation. This calculation has not been validated in all clinical situations. eGFR's persistently <60 mL/min signify possible Chronic Kidney Disease.    Anion gap 7 5 - 15    Imaging / Studies: No results found.  Medications / Allergies: per chart  Antibiotics: Anti-infectives (From admission, onward)   Start     Dose/Rate Route Frequency Ordered Stop   05/18/17 1100  piperacillin-tazobactam (ZOSYN) IVPB 3.375 g     3.375 g 12.5 mL/hr over 240 Minutes Intravenous Every 8 hours 05/18/17 1034     05/18/17 0945  piperacillin-tazobactam (ZOSYN) IVPB 3.375 g  Status:  Discontinued     3.375 g 100 mL/hr over 30 Minutes Intravenous  Once 05/18/17 0940 05/18/17 1034   05/17/17 1845  clindamycin (CLEOCIN) IVPB 900 mg     900 mg 100 mL/hr over 30 Minutes Intravenous On call to O.R. 05/17/17 1840 05/17/17 1937   05/17/17 1700  piperacillin-tazobactam (ZOSYN) IVPB 3.375 g  Status:  Discontinued     3.375 g 12.5 mL/hr over 240 Minutes  Intravenous Every 8 hours 05/17/17 1112 05/17/17 1717   05/17/17 0830  piperacillin-tazobactam (ZOSYN) IVPB 3.375 g     3.375 g 100 mL/hr over 30 Minutes Intravenous  Once 05/17/17 0816 05/17/17 1006        Note: Portions of this report may have been transcribed using voice recognition software. Every effort was made to ensure accuracy; however, inadvertent computerized transcription errors may be present.   Any transcriptional errors that result from this process are unintentional.     Adin Hector, M.D., F.A.C.S. Gastrointestinal and Minimally Invasive Surgery [facility] [address] [place], [place] [address] [phone] Main / Paging   05/20/2017

## 2017-05-20 NOTE — Progress Notes (Signed)
Key Points: Use following P&T approved IV to PO non-antibiotic change policy.  Description contains the criteria that are approved Note: Policy Excludes:  Esophagectomy patientsPHARMACIST - PHYSICIAN COMMUNICATION DR:   [facility] CONCERNING: IV to Oral Route Change Policy  RECOMMENDATION: This patient is receiving pepcid by the intravenous route.  Based on criteria approved by the Pharmacy and Therapeutics Committee, the intravenous medication(s) is/are being converted to the equivalent oral dose form(s).   DESCRIPTION: These criteria include:  The patient is eating (either orally or via tube) and/or has been taking other orally administered medications for a least 24 hours  The patient has no evidence of active gastrointestinal bleeding or impaired GI absorption (gastrectomy, short bowel, patient on TNA or NPO).  If you have questions about this conversion, please contact the Pharmacy Department  []   [phone] )  [name] []   [phone] )  [name]  []   [phone] )  [facility] [x]   [phone] )  [name], [facility] 05/20/2017 10:56 AM

## 2017-05-20 NOTE — Progress Notes (Signed)
[facility]  PROGRESS NOTE  [name] ZHG:[id number] DOB: March 25, 1934 DOA: 05/17/2017 PCP: [name], MD   Brief HPI:   84 y.o. male with past medical history relevant for previous coronary artery disease/CABG, history of orthostatic hypotension woke up around 3 AM with severe abdominal pain worse in the right upper quadrant, no fevers no chills, initially patient had nausea without vomiting, however in the ED here after morphine sulfate, patient had persistent emesis, despite IV Zofran and rectal Compazine patient vomited again after drinking oral contrast.  CT abdomen and pelvis was ordered which showed possible closed loop obstruction (please see Full CT report above), who case again discussed with surgical team Dr Leighton Ruff, Pt going to OR on 05/07/17.     Subjective  Afebrile, no chest pain, no shortness of breath, patient reports no further nausea vomiting.  So far tolerating clear liquids/full liquid diet.  Reports some flatus but no bowel movements yet.    Assessment/Plan:     1. Long segment jejunitis-patient underwent diagnostic laparoscopy and no bowel obstruction was found. Long segment of jejunitis was discovered on laparoscopy.  Gastroenterology service was consulted and recommended CT enterography versus small bowel.  Capsule study as an outpatient.  Patient will follow up with Dr. Joylene Igo in approximately 4 weeks after being discharged. Patient will also follow up with general surgery after discharge. So far tolerating clear liquids and full liquids diet; will advance to soft diet and hope for BM's prior to discharge. Appreciate general surgery rec's.   2. Cholelithiasis-incidental finding, no intervention needed at this time. LFT's WNL  3. Intractable nausea vomiting-secondary to jejunitis.  At this moment no further episode of nausea vomiting.  Continue as needed antiemetics.    4. History of CAD/prior CABG-no chest pain, no shortness of breath.   Will continue aspirin.  5. Acute kidney injury-most likely associated with prerenal azotemia due to dehydration and GI losses.  Patient renal function is back to normal after fluid resuscitation.  We will continue to monitor creatinine intermittently.      DVT prophylaxis: SCD's  Code Status: full code  Family Communication: discussed with patient's wife at bedside  Disposition Plan: likely home in am   Consultants:  general surgery  Procedures:  diagnostic laparoscopy  Continuous infusions . sodium chloride    . sodium chloride    . lactated ringers    . piperacillin-tazobactam (ZOSYN)  IV 3.375 g (05/20/17 1130)    Antibiotics:   Anti-infectives (From admission, onward)   Start     Dose/Rate Route Frequency Ordered Stop   05/18/17 1100  piperacillin-tazobactam (ZOSYN) IVPB 3.375 g     3.375 g 12.5 mL/hr over 240 Minutes Intravenous Every 8 hours 05/18/17 1034     05/18/17 0945  piperacillin-tazobactam (ZOSYN) IVPB 3.375 g  Status:  Discontinued     3.375 g 100 mL/hr over 30 Minutes Intravenous  Once 05/18/17 0940 05/18/17 1034   05/17/17 1845  clindamycin (CLEOCIN) IVPB 900 mg     900 mg 100 mL/hr over 30 Minutes Intravenous On call to O.R. 05/17/17 1840 05/17/17 1937   05/17/17 1700  piperacillin-tazobactam (ZOSYN) IVPB 3.375 g  Status:  Discontinued     3.375 g 12.5 mL/hr over 240 Minutes Intravenous Every 8 hours 05/17/17 1112 05/17/17 1717   05/17/17 0830  piperacillin-tazobactam (ZOSYN) IVPB 3.375 g     3.375 g 100 mL/hr over 30 Minutes Intravenous  Once 05/17/17 0816 05/17/17 1006      Objective  Vitals:   05/19/17 0630 05/19/17 1518 05/19/17 2050 05/20/17 0538  BP: (!) 166/81 137/75 (!) 167/83 128/69  Pulse: 73 84 77 87  Resp: 16 16 18 18   Temp: 97.8 F (36.6 C) 97.6 F (36.4 C) 98.1 F (36.7 C) 98.2 F (36.8 C)  TempSrc: Oral Oral Oral Oral  SpO2: 98% 98% 97% 93%  Weight:      Height:        Intake/Output Summary (Last 24 hours) at  05/20/2017 1639 Last data filed at 05/20/2017 0600 Gross per 24 hour  Intake 2675 ml  Output 2600 ml  Net 75 ml   Filed Weights   05/17/17 1900  Weight: 85.3 kg (188 lb)     Physical Examination: General: In no acute distress, denies chest pain, no shortness of breath, no nausea or vomiting.  Tolerating full liquid diet currently.  Passing flatus but no bowel movement yet. Eyes: PERRLA, no icterus, no nystagmus, extraocular muscles intact.   Lungs: Good air movement bilaterally, no wheezing, no crackles, normal respiratory effort.  No using accessory muscles. Heart: Regular rate and rhythm, S1 and S2 normal, no murmurs, rubs auscultated Abdomen: nontender, mildly distended, positive bowel sounds, no guarding.   Extremities: No edema, no cyanosis, no clubbing. Neuro : Alert, awake and oriented x3, no focal neurological or motor deficit appreciated. Skin: No erythema, no petechiae; patient's post surgical wound is clean and intact.    Data Reviewed: I have personally reviewed following labs and imaging studies  CBG: Recent Labs  Lab 05/17/17 2103  GLUCAP 145*    CBC: Recent Labs  Lab 05/17/17 0710 05/18/17 0623 05/19/17 0553  WBC 13.7* 15.6* 13.7*  HGB 14.7 11.9* 12.0*  HCT 44.0 37.1* 38.0*  MCV 91.5 93.7 94.5  PLT 156 169 403    Basic Metabolic Panel: Recent Labs  Lab 05/17/17 0710 05/18/17 0623 05/19/17 0553  NA 138 141 141  K 3.8 4.7 3.9  CL 98* 108 108  CO2 27 25 26   GLUCOSE 175* 170* 121*  BUN 29* 32* 26*  CREATININE 1.36* 1.42* 1.21  CALCIUM 9.3 7.9* 8.3*   Liver Function Tests: Recent Labs  Lab 05/17/17 0710 05/18/17 0623  AST 26 26  ALT 19 13*  ALKPHOS 80 56  BILITOT 0.9 0.8  PROT 6.9 5.5*  ALBUMIN 3.7 2.8*   Recent Labs  Lab 05/17/17 0710 05/18/17 0623  LIPASE 31 18    Studies: No results found.  Scheduled Meds: . aspirin EC  81 mg Oral Daily  . calcium carbonate  1 tablet Oral Daily  . cholecalciferol  5,000 Units Oral BID   . famotidine  20 mg Oral BID  . fludrocortisone  0.2 mg Oral Daily  . gabapentin  200 mg Oral BID  . heparin  5,000 Units Subcutaneous Q8H  . magnesium oxide  400 mg Oral Daily  . multivitamin with minerals  1 tablet Oral Daily  . ondansetron (ZOFRAN) IV  4 mg Intravenous Q6H  . polyethylene glycol  17 g Oral Daily  . senna  1 tablet Oral BID  . sodium chloride flush  3 mL Intravenous Q12H  . sodium chloride flush  3 mL Intravenous Q12H  . cyanocobalamin  1,000 mcg Oral Daily      Time spent: 25 min  Dennis Dubois MD   [facility] Pager [phone]. If 7PM-7AM, please contact night-coverage at [email or web address], password [facility]  05/20/2017, 4:39 PM  LOS: 3 days

## 2017-05-21 DIAGNOSIS — R1010 Upper abdominal pain, unspecified: Secondary | ICD-10-CM

## 2017-05-21 DIAGNOSIS — R109 Unspecified abdominal pain: Secondary | ICD-10-CM

## 2017-05-21 DIAGNOSIS — K5289 Other specified noninfective gastroenteritis and colitis: Secondary | ICD-10-CM

## 2017-05-21 DIAGNOSIS — N179 Acute kidney failure, unspecified: Secondary | ICD-10-CM

## 2017-05-21 DIAGNOSIS — I951 Orthostatic hypotension: Secondary | ICD-10-CM

## 2017-05-21 DIAGNOSIS — E119 Type 2 diabetes mellitus without complications: Secondary | ICD-10-CM

## 2017-05-21 MED ORDER — SENNA 8.6 MG PO TABS: 1.0000 | ORAL_TABLET | Freq: Two times a day (BID) | ORAL | 1 refills | Status: DC

## 2017-05-21 MED ORDER — OXYCODONE HCL 5 MG PO TABS: 5.0000 mg | ORAL_TABLET | Freq: Four times a day (QID) | ORAL | 0 refills | Status: DC | PRN

## 2017-05-21 MED ORDER — FAMOTIDINE 20 MG PO TABS: 20.0000 mg | ORAL_TABLET | Freq: Two times a day (BID) | ORAL | 1 refills | Status: DC

## 2017-05-21 MED ORDER — ACETAMINOPHEN 325 MG PO TABS: 650.0000 mg | ORAL_TABLET | Freq: Four times a day (QID) | ORAL | 0 refills | Status: DC | PRN

## 2017-05-21 MED ORDER — POLYETHYLENE GLYCOL 3350 17 G PO PACK: 17.0000 g | PACK | Freq: Every day | ORAL | 1 refills | Status: DC

## 2017-05-21 NOTE — Progress Notes (Signed)
[facility]  [address] [place], [place] [address] Phone: [phone]  FAX: [phone]      [name] [id number] April 12, 1934  CARE TEAM:  PCP: [name], MD  Outpatient Care Team: Patient Care Team: [name], MD as PCP - General Rosalyn Gess, Doreene Burke, PA-C as Physician Assistant (Cardiology) Stanford Breed Denice Bors, MD as Consulting Physician (Cardiology) Irene Shipper, MD as Consulting Physician (Gastroenterology) Leighton Ruff, MD as Consulting Physician (General Surgery)  Inpatient Treatment Team: Treatment Team: Attending Provider: Barton Dubois, MD; Consulting Physician: Nolon Nations, MD; Registered Nurse: Lolita Rieger, RN; Rounding Team: Ian Bushman, MD; Technician: Virgia Land, [place]; Technician: Lucila Maine, NT; Registered Nurse: Barrington Ellison, RN; Technician: Rutherford Guys, NT; Registered Nurse: Joselyn Glassman, RN   Problem List:   Principal Problem:   Jejunitis with partial SBO Active Problems:   Coronary atherosclerosis- s/p PCI to LAD in 2009 and PCI to RCA in 2011   Orthostatic hypotension   Diabetes mellitus type 2, diet-controlled ([place])   Cholelithiasis   Sinus Bradycardia   SBO (small bowel obstruction)/??? Volvulus   4 Days Post-Op  05/17/2017  Procedure(s): DIAGNOSTIC LAPAROSCOPY   Assessment  Partial obstruction from inflamed jejunum slowly resolving.  S/pDIAGNOSTIC LAPAROSCOPY1/16 [name] -no obstruction seen intraoperatively, found to have a long segment of jejunitis, concern for possible IBD  Plan:  Tolerating full liquids.  Advance to soft diet as tolerated.    Can be discharged and transition to close gastroenterology follow-up when meets goals, hopefully after lunch today.   [name] can help follow-up should there be concerns of inflammatory bowel disease.  Discussed with primary service, Dr. Dyann Kief, as well as the patient and his spouse in the room.   All on agreement with plan.  CAD-status post PTCA x2 / status post CABG Diabetes with diabetic neuropathy. History of tobacco use IBS/chronic constipation Hypertension AKI - Cr 1.21, resolved ID -zosyn 1/16>>day#3,clindamycin preop FEN -IVF, clear liquids VTE -SCDs, heparin Foley -out Follow up -[name], GI    15 minutes spent in review, evaluation, examination, counseling, and coordination of care.  More than 50% of that time was spent in counseling.  Adin Hector, M.D., F.A.C.S. Gastrointestinal and Minimally Invasive Surgery [facility] [address] [place], [place] [address] [phone] Main / Paging   05/21/2017    Subjective: (Chief complaint)  Feeling better. Lots of flatus. Large bowel movement Wife & Dr Dyann Kief at bedside. Tolerating Dys1 diet. Hoping to go home today.  Objective:  Vital signs:  Vitals:   05/19/17 2050 05/20/17 0538 05/20/17 2127 05/21/17 0512  BP: (!) 167/83 128/69 140/76 135/74  Pulse: 77 87 72 82  Resp: 18 18 17 16   Temp: 98.1 F (36.7 C) 98.2 F (36.8 C) 98.3 F (36.8 C) 98.4 F (36.9 C)  TempSrc: Oral Oral Oral Oral  SpO2: 97% 93% 96% 92%  Weight:      Height:           Intake/Output   Yesterday:  01/19 0701 - 01/20 0700 In: 1030 [P.O.:480; I.V.:300; IV Piggyback:150] Out: 1850 [Urine:1850] This shift:  Total I/O In: 240 [P.O.:240] Out: -   Bowel function:  Flatus: YES  BM:  YES - large  Drain: (No drain)   Physical Exam:  General: Pt awake/alert/oriented x4 in no acute distress Eyes: PERRL, normal EOM.  Sclera clear.  No icterus Neuro: CN II-XII intact w/o focal  sensory/motor deficits. Lymph: No head/neck/groin lymphadenopathy Psych:  No delerium/psychosis/paranoia HENT: Normocephalic, Mucus membranes moist.  No thrush Neck: Supple, No tracheal deviation Chest: No chest wall pain w good excursion CV:  Pulses intact.  Regular rhythm MS: Normal AROM  mjr joints.  No obvious deformity  Abdomen: Soft.  Nondistended.  Nontender.  No evidence of peritonitis.  No incarcerated hernias.  Ext:  No deformity.  No mjr edema.  No cyanosis Skin: No petechiae / purpura  Results:   Labs: No results found for this or any previous visit (from the past 48 hour(s)).  Imaging / Studies: No results found.  Medications / Allergies: per chart  Antibiotics: Anti-infectives (From admission, onward)   Start     Dose/Rate Route Frequency Ordered Stop   05/18/17 1100  piperacillin-tazobactam (ZOSYN) IVPB 3.375 g     3.375 g 12.5 mL/hr over 240 Minutes Intravenous Every 8 hours 05/18/17 1034     05/18/17 0945  piperacillin-tazobactam (ZOSYN) IVPB 3.375 g  Status:  Discontinued     3.375 g 100 mL/hr over 30 Minutes Intravenous  Once 05/18/17 0940 05/18/17 1034   05/17/17 1845  clindamycin (CLEOCIN) IVPB 900 mg     900 mg 100 mL/hr over 30 Minutes Intravenous On call to O.R. 05/17/17 1840 05/17/17 1937   05/17/17 1700  piperacillin-tazobactam (ZOSYN) IVPB 3.375 g  Status:  Discontinued     3.375 g 12.5 mL/hr over 240 Minutes Intravenous Every 8 hours 05/17/17 1112 05/17/17 1717   05/17/17 0830  piperacillin-tazobactam (ZOSYN) IVPB 3.375 g     3.375 g 100 mL/hr over 30 Minutes Intravenous  Once 05/17/17 0816 05/17/17 1006        Note: Portions of this report may have been transcribed using voice recognition software. Every effort was made to ensure accuracy; however, inadvertent computerized transcription errors may be present.   Any transcriptional errors that result from this process are unintentional.     Adin Hector, M.D., F.A.C.S. Gastrointestinal and Minimally Invasive Surgery [facility] [address] [place], [place] [address] [phone] Main / Paging   05/21/2017

## 2017-05-21 NOTE — Discharge Summary (Signed)
Physician Discharge Summary  [name] JSH:[id number] DOB: 12-01-1933 DOA: 05/17/2017  PCP: [name], MD  Admit date: 05/17/2017 Discharge date: 05/21/2017  Time spent: 35 minutes  Recommendations for Outpatient Follow-up:  1. Repeat BMET to follow electrolytes and renal function  2. Reassess CBg and need for hypoglycemic regimen    Discharge Diagnoses:  Principal Problem:   Jejunitis with partial SBO Active Problems:   Coronary atherosclerosis- s/p PCI to LAD in 2009 and PCI to RCA in 2011   Orthostatic hypotension   Diabetes mellitus type 2, diet-controlled (HCC)   Cholelithiasis   Sinus Bradycardia   SBO (small bowel obstruction)/??? Volvulus   Upper abdominal pain   Discharge Condition: stable and improved. Discharge home with instructions to follow up with PCP, general surgery and GI service as an outpatient.   Diet recommendation: low residue, heart healthy and modified carb diet.  Filed Weights   05/17/17 1900  Weight: 85.3 kg (188 lb)    History of present illness:  As per H&P written by Dr. Denton Brick on 05/17/17 84 y.o. male with past medical history relevant for previous coronary artery disease/CABG, history of orthostatic hypotension woke up around 3 AM with severe abdominal pain worse in the right upper quadrant, no fevers no chills, initially patient had nausea without vomiting, however in the ED here after morphine sulfate, patient had persistent emesis, despite IV Zofran and rectal Compazine patient vomited again after drinking oral contrast.     Hx of abdominal surgery secondary to urologic trauma in the 1970s. Pain is severe. No fever or diarrhea  Initially gallbladder ultrasound showed gallstones without acute cholecystitis, CBC 4.4 mm in the ED patient was seen by surgical team, consult appreciated, plan was initially for lap chole on 05/18/2017  Given generalized abdominal pain and persistent emesis , leukocytosis with white count of 13.7,  CT abdomen and pelvis was ordered which showed possible closed loop obstruction (please see Full CT report above), who case again discussed with surgical team Dr Leighton Ruff, Pt going to OR on 05/07/17.    Hx of abdominal surgery secondary to urologic trauma in the 1970s. Pain is severe. No fever or diarrhea.   Hospital Course:  1. Long segment jejunitis-patient underwent diagnostic laparoscopy and no bowel obstruction was found. Long segment of jejunitis was discovered on laparoscopy.  Gastroenterology service was consulted and recommended CT enterography versus small bowel.  Capsule study as an outpatient.  Patient will follow up with Dr. Silverio Decamp in approximately 3-4 weeks after being discharged. Patient will also follow up with general surgery after discharge. So far tolerating full liquid and soft diet. Patient passing flatus and had BM prior to discharge. Appreciate general surgery rec's. no antibiotics indicated at discharge.   2. Cholelithiasis-incidental finding, no intervention needed at this time. LFT's WNL. Will have follow up with general surgery as an outpatient.  3. Intractable nausea vomiting-secondary to jejunitis.  At this moment no further episode of nausea vomiting.  Continue PRN antiemetics.    4. History of CAD/prior CABG-no chest pain, no shortness of breath.  Will continue aspirin and heart healthy diet.  5. Acute kidney injury-most likely associated with prerenal azotemia due to dehydration and GI losses.  Patient renal function is back to normal after fluid resuscitation.  Will recommend BMET at follow up visit to reassess renal function trend.  6. Type diabetes: diet control. Will recommend to continue outpatient follow up and decide if hypoglycemic regimen is needed.     Procedures:  Diagnostic Laparoscopy   Consultations:  General surgery  GI  Discharge Exam: Vitals:   05/20/17 2127 05/21/17 0512  BP: 140/76 135/74  Pulse: 72 82  Resp: 17 16   Temp: 98.3 F (36.8 C) 98.4 F (36.9 C)  SpO2: 96% 92%    General: In no acute distress, denies chest pain, no shortness of breath, no nausea or vomiting. Tolerated full liquid diet and soft diet. Passing flatus and have large BM early this morning.   Eyes: PERRLA, no icterus, no nystagmus, extraocular muscles intact.    Lungs: Good air movement bilaterally, no wheezing, no crackles, normal respiratory effort.  No using accessory muscles.  Heart: Regular rate and rhythm, S1 and S2 normal, no murmurs, rubs auscultated  Abdomen: nontender, no distended, positive bowel sounds, no guarding.    Extremities: No edema, no cyanosis, no clubbing.  Neuro : Alert, awake and oriented x3, no focal neurological or motor deficit appreciated.  Skin: No erythema, no petechiae; patient's post surgical wound in his abdomen is clean and intact.    Discharge Instructions   Discharge Instructions    Diet - low sodium heart healthy   Complete by:  As directed    Discharge instructions   Complete by:  As directed    Take medications as prescribed Keep yourself well-hydrated Follow soft low residue diet as instructed on to follow-up with gastroenterology service Arrange follow-up with your primary care physician in 10 days Follow-up with [name] (general surgery service) as instructed.     Allergies as of 05/21/2017      Reactions   Cymbalta [duloxetine Hcl]    Dizziness   Keflex [cephalexin] Other (See Comments)   Reaction: unknown    Simvastatin Other (See Comments)   Reaction: unknown    Sudafed [pseudoephedrine]    Dizziness      Medication List    STOP taking these medications   Flaxseed (Linseed) 1000 MG Caps   omeprazole 20 MG capsule Commonly known as:  PRILOSEC     TAKE these medications   acetaminophen 325 MG tablet Commonly known as:  TYLENOL Take 2 tablets (650 mg total) by mouth every 6 (six) hours as needed for mild pain or headache (or Fever >/= 101).    aspirin 81 MG EC tablet Take 1 tablet (81 mg total) by mouth daily.   butalbital-acetaminophen-caffeine 50-325-40 MG tablet Commonly known as:  FIORICET, ESGIC Take 1-2 tablets by mouth every 6 (six) hours as needed for headache.   CALCIUM PO Take 1 tablet by mouth daily.   cyanocobalamin 1000 MCG tablet Take 1,000 mcg by mouth daily.   diclofenac sodium 1 % Gel Commonly known as:  VOLTAREN Apply 1 application topically 3 (three) times daily. Ribs, back. What changed:    when to take this  additional instructions   famotidine 20 MG tablet Commonly known as:  PEPCID Take 1 tablet (20 mg total) by mouth 2 (two) times daily.   fludrocortisone 0.1 MG tablet Commonly known as:  FLORINEF TAKE 2 TABLETS(200 MCG) BY MOUTH DAILY   gabapentin 100 MG capsule Commonly known as:  NEURONTIN Take 200 mg by mouth 2 (two) times daily. Take one tablet in the morning and one tablet in the evening   Magnesium 250 MG Tabs Take 500 mg by mouth daily.   multivitamin per tablet Take 1 tablet by mouth daily.   oxyCODONE 5 MG immediate release tablet Commonly known as:  Oxy IR/ROXICODONE Take 1 tablet (5 mg total) by  mouth every 6 (six) hours as needed for severe pain.   polyethylene glycol packet Commonly known as:  MIRALAX / GLYCOLAX Take 17 g by mouth daily. Start taking on:  05/22/2017   senna 8.6 MG Tabs tablet Commonly known as:  SENOKOT Take 1 tablet (8.6 mg total) by mouth 2 (two) times daily.   VITAMIN D PO Take 5,000 Units by mouth 2 (two) times daily.      Allergies  Allergen Reactions  . Cymbalta [Duloxetine Hcl]     Dizziness  . Keflex [Cephalexin] Other (See Comments)    Reaction: unknown   . Simvastatin Other (See Comments)    Reaction: unknown   . Sudafed [Pseudoephedrine]     Dizziness   Follow-up Information    [name], MD. Schedule an appointment as soon as possible for a visit in 10 day(s).   Specialty:  Internal Medicine Contact  information: [address] [place] [address] [phone]        Leighton Ruff, MD. Go on 10/07/6718.   Specialty:  General Surgery Why:  Your appointment is 2/4/819 @ 9:30AM with [name]. Please arrive 30 minutes prior to your appointment to check in and fill out paperwork. Bring photo ID and insurance information. Contact information: [place] [address] [phone]        Mauri Pole, MD. Schedule an appointment as soon as possible for a visit in 3 week(s).   Specialty:  Gastroenterology Contact information: [place] [address] [phone]            The results of significant diagnostics from this hospitalization (including imaging, microbiology, ancillary and laboratory) are listed below for reference.    Significant Diagnostic Studies: Ct Abdomen Pelvis Wo Contrast  Result Date: 05/17/2017 CLINICAL DATA:  Acute right upper quadrant abdominal pain, nausea. EXAM: CT ABDOMEN AND PELVIS WITHOUT CONTRAST TECHNIQUE: Multidetector CT imaging of the abdomen and pelvis was performed following the standard protocol without IV contrast. COMPARISON:  CT scan of August 14, 2013. FINDINGS: Lower chest: No acute abnormality. Hepatobiliary: Mild cholelithiasis is noted. No focal abnormality seen in the liver on these unenhanced images. Pancreas: Unremarkable. No pancreatic ductal dilatation or surrounding inflammatory changes. Spleen: Normal in size without focal abnormality. Adrenals/Urinary Tract: Adrenal glands are unremarkable. Kidneys are normal, without renal calculi, focal lesion, or hydronephrosis. Bladder is unremarkable. Stomach/Bowel: The stomach is unremarkable. No colonic dilatation is noted. There are several loops of severely dilated proximal small bowel seen in the right side of the abdomen, with nondilated bowel seen proximal and distal to this. Proximal transition zone is seen on image number  39 of series 2, with distal transition zone seen on same image. Possibility of volvulus or closed loop obstruction cannot be excluded. Alternatively, this may represent adhesions. Vascular/Lymphatic: 3.6 cm infrarenal abdominal aortic aneurysm is noted. No significant adenopathy is noted. 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 Reproductive: Mild prostatic enlargement is noted. Other: No abdominal wall hernia or abnormality. No abdominopelvic ascites. Musculoskeletal: No acute or significant osseous findings. IMPRESSION: Severely dilated loops of proximal small bowel are seen in the right side of the abdomen, with transition zone seen both proximally and distally to the dilated small bowel loops. Potentially this may be due to adhesions, but the possibility of volvulus or closed loop obstruction cannot be excluded. Critical Value/emergent results were called by telephone at the time of interpretation on 05/17/2017 at 4:36  pm to Dr. Venora Maples , who verbally acknowledged these results. Mild cholelithiasis. 3.6 cm infrarenal abdominal aortic aneurysm. Recommend followup by ultrasound in 2 years. This recommendation follows ACR consensus guidelines: White Paper of the ACR Incidental Findings Committee II on Vascular Findings. J Am Coll Radiol 2013; 10:789-794. Electronically Signed   By: Marijo Conception, M.D.   On: 05/17/2017 16:36   US Abdomen Limited Ruq  Result Date: 05/17/2017 CLINICAL DATA:  Acute abdominal pain, hypertension EXAM: ULTRASOUND ABDOMEN LIMITED RIGHT UPPER QUADRANT COMPARISON:  CT 08/14/2013 FINDINGS: Gallbladder: Gallstones measuring up to 0.7 cm. No gallbladder wall thickening or pericholecystic fluid. The gallbladder is incompletely distended. Common bile duct: Diameter: 4.4 mm, unremarkable Liver: No focal lesion identified. Within normal limits in parenchymal echogenicity. Portal vein is patent on color Doppler imaging with normal direction of blood flow towards the  liver. There is a trace amount of perihepatic ascites. IMPRESSION: 1. Cholelithiasis without other ultrasound evidence of cholecystitis or biliary obstruction. 2. Trace perihepatic ascites. Electronically Signed   By: Lucrezia Europe M.D.   On: 05/17/2017 09:34   Labs: Basic Metabolic Panel: Recent Labs  Lab 05/17/17 0710 05/18/17 0623 05/19/17 0553  NA 138 141 141  K 3.8 4.7 3.9  CL 98* 108 108  CO2 27 25 26   GLUCOSE 175* 170* 121*  BUN 29* 32* 26*  CREATININE 1.36* 1.42* 1.21  CALCIUM 9.3 7.9* 8.3*   Liver Function Tests: Recent Labs  Lab 05/17/17 0710 05/18/17 0623  AST 26 26  ALT 19 13*  ALKPHOS 80 56  BILITOT 0.9 0.8  PROT 6.9 5.5*  ALBUMIN 3.7 2.8*   Recent Labs  Lab 05/17/17 0710 05/18/17 0623  LIPASE 31 18   CBC: Recent Labs  Lab 05/17/17 0710 05/18/17 0623 05/19/17 0553  WBC 13.7* 15.6* 13.7*  HGB 14.7 11.9* 12.0*  HCT 44.0 37.1* 38.0*  MCV 91.5 93.7 94.5  PLT 156 169 153   CBG: Recent Labs  Lab 05/17/17 2103  GLUCAP 145*    Signed:  Barton Dubois MD.  [facility] 05/21/2017, 11:45 AM

## 2017-05-21 NOTE — Progress Notes (Signed)
Pt leaving this afternoon with his spouse. Alert, oriented, and without c/o. Discharge instructions/prescriptions given/explained with pt and spouse verbalizing understanding. Pt aware of followup appointments.

## 2017-05-22 ENCOUNTER — Telehealth: Payer: Self-pay | Admitting: Internal Medicine

## 2017-05-22 ENCOUNTER — Telehealth: Payer: Self-pay | Admitting: *Deleted

## 2017-05-22 NOTE — Telephone Encounter (Signed)
Pt called & scheduled for 11/24 for hosp fu

## 2017-05-22 NOTE — Telephone Encounter (Signed)
Pt should be scheduled to see Dr. Henrene Pastor.

## 2017-05-22 NOTE — Telephone Encounter (Signed)
Patient wife states pt was seen by Dr.Nandigam in hosp on 1.16.19 and was told to schedule follow up ov with her. Pt is Dr.Perry pt last seen in 2012. Who do I need to sch follow up with. Please advise.

## 2017-05-23 ENCOUNTER — Encounter: Payer: Self-pay | Admitting: Internal Medicine

## 2017-05-23 NOTE — Telephone Encounter (Signed)
Left message on vm for patient to cb and schedule next ov.

## 2017-05-24 ENCOUNTER — Ambulatory Visit: Payer: Self-pay | Admitting: Physical Medicine & Rehabilitation

## 2017-05-24 NOTE — Progress Notes (Signed)
Hospital follow up  Assessment and Plan: Hospital visit follow up for : jejunitis with partial small bowel obstruction Hospital discharge meds were reviewed, and reconciled with the patient.   Diagnoses and all orders for this visit:  Jejunitis with partial SBO -     CBC with Differential/Platelet Continue soft diet, low bulk, continue miralax with very slow progression of diet to prevent recurrence. Follow up with gastrologist as scheduled. Monitor BMs closely, report to office should character or frequency change.   Calculus of gallbladder without cholecystitis without obstruction Follow up with [name]; appears chronic and appropriate for outpatient management. Mention firmness of supraumbilical port site firmness to her; continue to monitor for signs of infection - discussed at length today.   AKI (acute kidney injury) ([place]) -     BASIC METABOLIC PANEL WITH GFR  Diabetes mellitus type 2, diet-controlled ([place]) Currently well managed by lifestyle; continue diet and exercise.  Perform daily foot/skin check, notify office of any concerning changes.  He does check BGLs intermittently; never had any hypoglycemic episodes He is able to describe hypoglycemic symptoms and appropriate response to this - information given in handout.   There are no discontinued medications.  Over 40 minutes of exam, counseling, chart review, and complex, high/moderate level critical decision making was performed this visit.   Future Appointments  Date Time Provider [place]  06/05/2017  8:45 AM [facility] None  06/05/2017  9:00 AM Kirsteins, Luanna Salk, MD [facility] None  06/30/2017 11:00 AM Irene Shipper, MD [facility]  07/06/2017 11:15 AM [name], MD [facility] None  12/12/2017  9:00 AM [name], MD [facility] None     HPI 84 y.o.male presents for follow up for transition from recent hospitalization or SNIF stay. Admit date to the hospital was 05/17/17,  patient was discharged from the hospital on 05/21/17 and our clinical staff contacted the office the day after discharge to set up a follow up appointment. The discharge summary, medications, and diagnostic test results were reviewed before meeting with the patient. The patient was admitted for: jejunitis and partial small bowel obstruction. He presented to ED on 1/16 with RUQ pain, nausea, progressive emesis - Initially gallbladder ultrasound showed gallstones without acute cholecystitis, CBC 4.4 mmin the ED patient was seen by surgical team,plan was initially for lap chole on 05/18/2017. Given generalized abdominal pain and persistent emesis,leukocytosis with white count of 13.7, CT abdomen and pelvis wasorderedwhich showed possible closed loop obstruction. Patient underwent diagnostic laparoscopy on 05/07/2017 for suspected small bowel obstruction.   Long segment jejunitis-patient underwent diagnostic laparoscopy and no bowel obstruction was found. Long segmentof jejunitiswas discovered on laparoscopy.Gastroenterology service was consulted and recommended CT enterography versus small bowel. Capsule study as an outpatient. Patient will follow up with Dr. Silverio Decamp (gastrologist) 3/1.Patientwill also follow up with [name] (general surgery) on 2/4. So far tolerating full liquid and soft diet. Patient passing flatus and had BM prior to discharge.  He continues to have daily soft BM with miralax BID.   Cholelithiasis-incidental finding, no intervention needed emergently.LFT's WNL. Plan to follow up with general surgery as an outpatient. There is follow up with [name] 2/4, and has an appointment with gastrologist 3/1.   Intractable nausea vomiting-secondary to jejunitis. At this moment no further episode of nausea vomiting. Continue PRN antiemetics.   Acute kidney injury-most likely associated with prerenal azotemia due to dehydration and GI losses. Patient renal function is back to  normal after fluid resuscitation. Will obtain  BMET today per recommendation to reassess renal function trend.  Need for hypoglycemic regimen -Reassess CBg and need for hypoglycemic regimen per discharge recommendations today. He does check his BGLs sporadically - 2-3 times weekly fasting -running 90-120. He denies known episodes of hypoglycemia, is able to describe symptoms of hypoglycemia with appropriate response.   Home health is not involved.   Images while in the hospital: Ct Abdomen Pelvis Wo Contrast  Result Date: 05/17/2017 CLINICAL DATA:  Acute right upper quadrant abdominal pain, nausea. EXAM: CT ABDOMEN AND PELVIS WITHOUT CONTRAST TECHNIQUE: Multidetector CT imaging of the abdomen and pelvis was performed following the standard protocol without IV contrast. COMPARISON:  CT scan of August 14, 2013. FINDINGS: Lower chest: No acute abnormality. Hepatobiliary: Mild cholelithiasis is noted. No focal abnormality seen in the liver on these unenhanced images. Pancreas: Unremarkable. No pancreatic ductal dilatation or surrounding inflammatory changes. Spleen: Normal in size without focal abnormality. Adrenals/Urinary Tract: Adrenal glands are unremarkable. Kidneys are normal, without renal calculi, focal lesion, or hydronephrosis. Bladder is unremarkable. Stomach/Bowel: The stomach is unremarkable. No colonic dilatation is noted. There are several loops of severely dilated proximal small bowel seen in the right side of the abdomen, with nondilated bowel seen proximal and distal to this. Proximal transition zone is seen on image number 39 of series 2, with distal transition zone seen on same image. Possibility of volvulus or closed loop obstruction cannot be excluded. Alternatively, this may represent adhesions. Vascular/Lymphatic: 3.6 cm infrarenal abdominal aortic aneurysm is noted. No significant adenopathy is noted. 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 Reproductive: Mild  prostatic enlargement is noted. Other: No abdominal wall hernia or abnormality. No abdominopelvic ascites. Musculoskeletal: No acute or significant osseous findings. IMPRESSION: Severely dilated loops of proximal small bowel are seen in the right side of the abdomen, with transition zone seen both proximally and distally to the dilated small bowel loops. Potentially this may be due to adhesions, but the possibility of volvulus or closed loop obstruction cannot be excluded. Critical Value/emergent results were called by telephone at the time of interpretation on 05/17/2017 at 4:36 pm to Dr. Venora Maples , who verbally acknowledged these results. Mild cholelithiasis. 3.6 cm infrarenal abdominal aortic aneurysm. Recommend followup by ultrasound in 2 years. This recommendation follows ACR consensus guidelines: White Paper of the ACR Incidental Findings Committee II on Vascular Findings. J Am Coll Radiol 2013; 10:789-794. Electronically Signed   By: Marijo Conception, M.D.   On: 05/17/2017 16:36   US Abdomen Limited Ruq  Result Date: 05/17/2017 CLINICAL DATA:  Acute abdominal pain, hypertension EXAM: ULTRASOUND ABDOMEN LIMITED RIGHT UPPER QUADRANT COMPARISON:  CT 08/14/2013 FINDINGS: Gallbladder: Gallstones measuring up to 0.7 cm. No gallbladder wall thickening or pericholecystic fluid. The gallbladder is incompletely distended. Common bile duct: Diameter: 4.4 mm, unremarkable Liver: No focal lesion identified. Within normal limits in parenchymal echogenicity. Portal vein is patent on color Doppler imaging with normal direction of blood flow towards the liver. There is a trace amount of perihepatic ascites. IMPRESSION: 1. Cholelithiasis without other ultrasound evidence of cholecystitis or biliary obstruction. 2. Trace perihepatic ascites. Electronically Signed   By: Lucrezia Europe M.D.   On: 05/17/2017 09:34    Past Medical History:  Diagnosis Date  . 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 Colon polyps   . Coronary  atherosclerosis of unspecified type of vessel, native or graft   . Difficult intubation   . Esophageal reflux   . Hypertension   .  IBS (irritable bowel syndrome)   . Orthostatic hypotension    "BP has been dropping alot when I stand up for the last month or so" (02/17/2016)  . Vitamin B 12 deficiency   . Vitamin D deficiency      Allergies  Allergen Reactions  . Cymbalta [Duloxetine Hcl]     Dizziness  . Keflex [Cephalexin] Other (See Comments)    Reaction: unknown   . Simvastatin Other (See Comments)    Reaction: unknown   . Sudafed [Pseudoephedrine]     Dizziness      Current Outpatient Medications on File Prior to Visit  Medication Sig Dispense Refill  . acetaminophen (TYLENOL) 325 MG tablet Take 2 tablets (650 mg total) by mouth every 6 (six) hours as needed for mild pain or headache (or Fever >/= 101). 40 tablet 0  . aspirin EC 81 MG EC tablet Take 1 tablet (81 mg total) by mouth daily.    . butalbital-acetaminophen-caffeine (FIORICET, ESGIC) 50-325-40 MG tablet Take 1-2 tablets by mouth every 6 (six) hours as needed for headache. 60 tablet 0  . CALCIUM PO Take 1 tablet by mouth daily.    . Cholecalciferol (VITAMIN D PO) Take 5,000 Units by mouth daily.     . cyanocobalamin 1000 MCG tablet Take 1,000 mcg by mouth daily.    . diclofenac sodium (VOLTAREN) 1 % GEL Apply 1 application topically 3 (three) times daily. Ribs, back. (Patient taking differently: Apply 1 application topically daily. Ribs, back.) 3 Tube 4  . famotidine (PEPCID) 20 MG tablet Take 1 tablet (20 mg total) by mouth 2 (two) times daily. 60 tablet 1  . fludrocortisone (FLORINEF) 0.1 MG tablet TAKE 2 TABLETS(200 MCG) BY MOUTH DAILY 180 tablet 0  . gabapentin (NEURONTIN) 100 MG capsule Take 200 mg by mouth 2 (two) times daily. Take one tablet in the morning and one tablet in the evening     . multivitamin (THERAGRAN) per tablet Take 1 tablet by mouth daily.      Marland Kitchen oxyCODONE (OXY IR/ROXICODONE) 5 MG immediate  release tablet Take 1 tablet (5 mg total) by mouth every 6 (six) hours as needed for severe pain. 20 tablet 0  . polyethylene glycol (MIRALAX / GLYCOLAX) packet Take 17 g by mouth daily. 28 each 1  . senna (SENOKOT) 8.6 MG TABS tablet Take 1 tablet (8.6 mg total) by mouth 2 (two) times daily. 60 each 1  . Magnesium 250 MG TABS Take 500 mg by mouth daily.      No current facility-administered medications on file prior to visit.     ROS: all negative except above.   Physical Exam: Filed Weights   05/25/17 1350  Weight: 194 lb (88 kg)   BP 128/74   Pulse 74   Temp (!) 97.3 F (36.3 C)   Wt 194 lb (88 kg)   SpO2 98%   BMI 26.68 kg/m  General Appearance: Well nourished, in no apparent distress. Eyes: PERRLA, EOMs, conjunctiva no swelling or erythema Sinuses: No Frontal/maxillary tenderness ENT/Mouth: Ext aud canals clear, TMs without erythema, bulging. No erythema, swelling, or exudate on post pharynx.  Tonsils not swollen or erythematous. Hearing normal.  Neck: Supple, thyroid normal.  Respiratory: Respiratory effort normal, BS equal bilaterally without rales, rhonchi, wheezing or stridor.  Cardio: RRR with no MRGs. Brisk peripheral pulses without edema.  Abdomen: Soft, + BS.  Non tender, no guarding, rebound, hernias, or palpable deep masses. 4 port sites present with dermabond still present; supraumbilical  site with some superficial firmness/lump formed; mildly tender but without purulent discharge, injection, heat or other concerning signs of infection.  Lymphatics: Non tender without lymphadenopathy.  Musculoskeletal: Full ROM, 5/5 strength, slow normal gait with cane.   Skin: Warm, dry without rashes, lesions, ecchymosis.  Neuro: Cranial nerves intact. Normal muscle tone, no cerebellar symptoms. Sensation intact.  Psych: Awake and oriented X 3, normal affect, Insight and Judgment appropriate.     Izora Ribas, NP 6:13 PM [facility]

## 2017-05-25 ENCOUNTER — Ambulatory Visit: Payer: Medicare Other | Admitting: Adult Health

## 2017-05-25 ENCOUNTER — Encounter: Payer: Self-pay | Admitting: Adult Health

## 2017-05-25 ENCOUNTER — Ambulatory Visit: Payer: Self-pay | Admitting: Adult Health

## 2017-05-25 VITALS — BP 128/74 | HR 74 | Temp 97.3°F | Wt 194.0 lb

## 2017-05-25 DIAGNOSIS — K8020 Calculus of gallbladder without cholecystitis without obstruction: Secondary | ICD-10-CM | POA: Diagnosis not present

## 2017-05-25 DIAGNOSIS — N179 Acute kidney failure, unspecified: Secondary | ICD-10-CM

## 2017-05-25 DIAGNOSIS — E119 Type 2 diabetes mellitus without complications: Secondary | ICD-10-CM | POA: Diagnosis not present

## 2017-05-25 DIAGNOSIS — K529 Noninfective gastroenteritis and colitis, unspecified: Secondary | ICD-10-CM | POA: Diagnosis not present

## 2017-05-25 DIAGNOSIS — K56609 Unspecified intestinal obstruction, unspecified as to partial versus complete obstruction: Secondary | ICD-10-CM

## 2017-05-25 NOTE — Patient Instructions (Signed)
Continue with very slow progression of soft, low bulk diet  Drink plenty of fluids - 80-100 fluid ounces daily of water or other unsweetened beverage  Contact a health care provider if:  You used stool softeners or laxatives and still have not had a bowel movement within 24-48 hours after using them.  You have not had a bowel movement in 3 days. Get help right away if:  Your constipation lasts for more than 4 days or gets worse.  You have bright red blood in your stool.  You have abdominal or rectal pain.  You have very bad cramping.  You have thin, pencil-like stools.  You have unexplained weight loss.  You have a fever or persistent symptoms for more than 2-3 days.  You have a fever and your symptoms suddenly get worse.     Hypoglycemia Hypoglycemia is when the sugar (glucose) level in the blood is too low. Symptoms of low blood sugar may include:  Feeling: ? Hungry. ? Worried or nervous (anxious). ? Sweaty and clammy. ? Confused. ? Dizzy. ? Sleepy. ? Sick to your stomach (nauseous).  Having: ? A fast heartbeat. ? A headache. ? A change in your vision. ? Jerky movements that you cannot control (seizure). ? Nightmares. ? Tingling or no feeling (numbness) around the mouth, lips, or tongue.  Having trouble with: ? Talking. ? Paying attention (concentrating). ? Moving (coordination). ? Sleeping.  Shaking.  Passing out (fainting).  Getting upset easily (irritability).  Low blood sugar can happen to people who have diabetes and people who do not have diabetes. Low blood sugar can happen quickly, and it can be an emergency. Treating Low Blood Sugar Low blood sugar is often treated by eating or drinking something sugary right away. If you can think clearly and swallow safely, follow the 15:15 rule:  Take 15 grams of a fast-acting carb (carbohydrate). Some fast-acting carbs are: ? 1 tube of glucose gel. ? 3 sugar tablets (glucose pills). ? 6-8 pieces of  hard candy. ? 4 oz (120 mL) of fruit juice. ? 4 oz (120 mL) of regular (not diet) soda.  Check your blood sugar 15 minutes after you take the carb.  If your blood sugar is still at or below 70 mg/dL (3.9 mmol/L), take 15 grams of a carb again.  If your blood sugar does not go above 70 mg/dL (3.9 mmol/L) after 3 tries, get help right away.  After your blood sugar goes back to normal, eat a meal or a snack within 1 hour.  Treating Very Low Blood Sugar If your blood sugar is at or below 54 mg/dL (3 mmol/L), you have very low blood sugar (severe hypoglycemia). This is an emergency. Do not wait to see if the symptoms will go away. Get medical help right away. Call your local emergency services (911 in the [place] Do not drive yourself to the hospital. If you have very low blood sugar and you cannot eat or drink, you may need a glucagon shot (injection). A family member or friend should learn how to check your blood sugar and how to give you a glucagon shot. Ask your doctor if you need to have a glucagon shot kit at home. Follow these instructions at home: General instructions  Avoid any diets that cause you to not eat enough food. Talk with your doctor before you start any new diet.  Take over-the-counter and prescription medicines only as told by your doctor.  Limit alcohol to no more than 1  drink per day for nonpregnant women and 2 drinks per day for men. One drink equals 12 oz of beer, 5 oz of wine, or 1 oz of hard liquor.  Keep all follow-up visits as told by your doctor. This is important. If You Have Diabetes:   Make sure you know the symptoms of low blood sugar.  Always keep a source of sugar with you, such as: ? Sugar. ? Sugar tablets. ? Glucose gel. ? Fruit juice. ? Regular soda (not diet soda). ? Milk. ? Hard candy. ? Honey.  Take your medicines as told.  Follow your exercise and meal plan. ? Eat on time. Do not skip meals. ? Follow your sick day plan when you cannot  eat or drink normally. Make this plan ahead of time with your doctor.  Check your blood sugar as often as told by your doctor. Always check before and after exercise.  Share your diabetes care plan with: ? Your work or school. ? People you live with.  Check your pee (urine) for ketones: ? When you are sick. ? As told by your doctor.  Carry a card or wear jewelry that says you have diabetes. If You Have Low Blood Sugar From Other Causes:   Check your blood sugar as often as told by your doctor.  Follow instructions from your doctor about what you cannot eat or drink. Contact a doctor if:  You have trouble keeping your blood sugar in your target range.  You have low blood sugar often. Get help right away if:  You still have symptoms after you eat or drink something sugary.  Your blood sugar is at or below 54 mg/dL (3 mmol/L).  You have jerky movements that you cannot control.  You pass out. These symptoms may be an emergency. Do not wait to see if the symptoms will go away. Get medical help right away. Call your local emergency services (911 in the [place] Do not drive yourself to the hospital. This information is not intended to replace advice given to you by your health care provider. Make sure you discuss any questions you have with your health care provider. Document Released: 07/13/2009 Document Revised: 09/24/2015 Document Reviewed: 05/22/2015 Elsevier Interactive Patient Education  [employer].

## 2017-05-26 ENCOUNTER — Other Ambulatory Visit: Payer: Self-pay | Admitting: Internal Medicine

## 2017-05-26 LAB — CBC WITH DIFFERENTIAL/PLATELET
Basophils Absolute: 31 cells/uL (ref 0–200)
Basophils Relative: 0.4 %
Eosinophils Absolute: 254 cells/uL (ref 15–500)
Eosinophils Relative: 3.3 %
HCT: 34.7 % — ABNORMAL LOW (ref 38.5–50.0)
Hemoglobin: 11.3 g/dL — ABNORMAL LOW (ref 13.2–17.1)
Lymphs Abs: 1925 cells/uL (ref 850–3900)
MCH: 29.8 pg (ref 27.0–33.0)
MCHC: 32.6 g/dL (ref 32.0–36.0)
MCV: 91.6 fL (ref 80.0–100.0)
MPV: 9.9 fL (ref 7.5–12.5)
Monocytes Relative: 6.8 %
Neutro Abs: 4967 cells/uL (ref 1500–7800)
Neutrophils Relative %: 64.5 %
Platelets: 242 10*3/uL (ref 140–400)
RBC: 3.79 10*6/uL — ABNORMAL LOW (ref 4.20–5.80)
RDW: 14.6 % (ref 11.0–15.0)
Total Lymphocyte: 25 %
WBC mixed population: 524 cells/uL (ref 200–950)
WBC: 7.7 10*3/uL (ref 3.8–10.8)

## 2017-05-26 LAB — BASIC METABOLIC PANEL WITH GFR
BUN/Creatinine Ratio: 10 (calc) (ref 6–22)
BUN: 13 mg/dL (ref 7–25)
CO2: 33 mmol/L — ABNORMAL HIGH (ref 20–32)
Calcium: 9 mg/dL (ref 8.6–10.3)
Chloride: 105 mmol/L (ref 98–110)
Creat: 1.3 mg/dL — ABNORMAL HIGH (ref 0.70–1.11)
GFR, Est African American: 59 mL/min/{1.73_m2} — ABNORMAL LOW (ref >=60)
GFR, Est Non African American: 51 mL/min/{1.73_m2} — ABNORMAL LOW (ref >=60)
Glucose, Bld: 104 mg/dL — ABNORMAL HIGH (ref 65–99)
Potassium: 4.9 mmol/L (ref 3.5–5.3)
Sodium: 145 mmol/L (ref 135–146)

## 2017-06-05 ENCOUNTER — Encounter: Payer: Medicare Other | Attending: Physical Medicine & Rehabilitation

## 2017-06-05 ENCOUNTER — Encounter: Payer: Self-pay | Admitting: Physical Medicine & Rehabilitation

## 2017-06-05 ENCOUNTER — Ambulatory Visit: Payer: Medicare Other | Admitting: Physical Medicine & Rehabilitation

## 2017-06-05 VITALS — BP 167/89 | HR 79 | Resp 14

## 2017-06-05 DIAGNOSIS — K589 Irritable bowel syndrome without diarrhea: Secondary | ICD-10-CM | POA: Insufficient documentation

## 2017-06-05 DIAGNOSIS — K219 Gastro-esophageal reflux disease without esophagitis: Secondary | ICD-10-CM | POA: Insufficient documentation

## 2017-06-05 DIAGNOSIS — M545 Low back pain: Secondary | ICD-10-CM | POA: Insufficient documentation

## 2017-06-05 DIAGNOSIS — M47816 Spondylosis without myelopathy or radiculopathy, lumbar region: Secondary | ICD-10-CM

## 2017-06-05 DIAGNOSIS — Z955 Presence of coronary angioplasty implant and graft: Secondary | ICD-10-CM | POA: Insufficient documentation

## 2017-06-05 DIAGNOSIS — R0789 Other chest pain: Secondary | ICD-10-CM | POA: Insufficient documentation

## 2017-06-05 DIAGNOSIS — I2510 Atherosclerotic heart disease of native coronary artery without angina pectoris: Secondary | ICD-10-CM | POA: Diagnosis not present

## 2017-06-05 DIAGNOSIS — I10 Essential (primary) hypertension: Secondary | ICD-10-CM | POA: Diagnosis not present

## 2017-06-05 DIAGNOSIS — Z8249 Family history of ischemic heart disease and other diseases of the circulatory system: Secondary | ICD-10-CM | POA: Diagnosis not present

## 2017-06-05 DIAGNOSIS — Z79899 Other long term (current) drug therapy: Secondary | ICD-10-CM | POA: Insufficient documentation

## 2017-06-05 DIAGNOSIS — E559 Vitamin D deficiency, unspecified: Secondary | ICD-10-CM | POA: Insufficient documentation

## 2017-06-05 DIAGNOSIS — Z87891 Personal history of nicotine dependence: Secondary | ICD-10-CM | POA: Insufficient documentation

## 2017-06-05 DIAGNOSIS — E785 Hyperlipidemia, unspecified: Secondary | ICD-10-CM | POA: Diagnosis not present

## 2017-06-05 DIAGNOSIS — G8929 Other chronic pain: Secondary | ICD-10-CM | POA: Insufficient documentation

## 2017-06-05 DIAGNOSIS — E1142 Type 2 diabetes mellitus with diabetic polyneuropathy: Secondary | ICD-10-CM | POA: Diagnosis not present

## 2017-06-05 DIAGNOSIS — Z951 Presence of aortocoronary bypass graft: Secondary | ICD-10-CM | POA: Insufficient documentation

## 2017-06-05 DIAGNOSIS — M4185 Other forms of scoliosis, thoracolumbar region: Secondary | ICD-10-CM | POA: Diagnosis not present

## 2017-06-05 DIAGNOSIS — G588 Other specified mononeuropathies: Secondary | ICD-10-CM | POA: Insufficient documentation

## 2017-06-05 NOTE — Progress Notes (Signed)
Right T 12, L1, L2 medial branch blocks under fluoroscopic guidance  Indication: Right Lumbar pain which is not relieved by medication management or other conservative care and interfering with self-care and mobility.  Informed consent was obtained after describing risks and benefits of the procedure with the patient, this includes bleeding, bruising, infection, paralysis and medication side effects. The patient wishes to proceed and has given written consent. The patient was placed in a prone position. The lumbar area was marked and prepped with Betadine. One ML of 1% lidocaine was injected into each of 3 areas into the skin and subcutaneous tissue. Then a 22-gauge 3.5 inch spinal needle was inserted targeting the junction of the Right L3 superior articular process /transverse process junction. Needle was advanced under fluoroscopic guidance. Bone contact was made. Omnipaque 180 was injected x0.5 mL demonstrating no intravascular uptake. Then a solution containing one ML of 4 mg per mL dexamethasone and 3 mL of 2% MPF lidocaine was injected x0.5 mL. Then the Right L2 superior articular process in transverse process junction was targeted. Bone contact was made. Omnipaque 180 was injected x0.5 mL demonstrating no intravascular uptake. Then a solution containing one ML of 4 mg per mL dexamethasone and 3 mL of 2% MPF lidocaine was injected x0.5 mL. Then the Right L1 superior articular process in transverse process junction was targeted. Bone contact was made. Omnipaque 180 was injected x0.5 mL demonstrating no intravascular uptake. Then a solution containing one ML of 4 mg per mL dexamethasone and 3 mL of 2% MPF lidocaine was injected x0.5 mL. Patient tolerated procedure well. Post procedure instructions were given. Please refer to post procedure form. 

## 2017-06-05 NOTE — Patient Instructions (Signed)

## 2017-06-05 NOTE — Progress Notes (Signed)
  [facility]   Name: [name] DOB:1933-05-10 MRN: [id number]  Date:06/05/2017  Physician: Alysia Penna, MD    Nurse/CMA: Kimiye Strathman, CMA  Allergies:  Allergies  Allergen Reactions  . Cymbalta [Duloxetine Hcl]     Dizziness  . Keflex [Cephalexin] Other (See Comments)    Reaction: unknown   . Simvastatin Other (See Comments)    Reaction: unknown   . Sudafed [Pseudoephedrine]     Dizziness    Consent Signed: Yes.    Is patient diabetic? Yes.    CBG today? 110  Pregnant: No. LMP: No LMP for male patient. (age 4-55)  Anticoagulants: no Anti-inflammatory: no Antibiotics: no  Procedure: right T12,L1,L2 medial branch block Position: Prone Start Time: 9:29am      End Time: 9:37am  Fluoro Time: 24s  RN/CMA Davon Folta, CMA Camree Wigington, CMA    Time 8:55am 943am    BP 167/89 185/94    Pulse 79 81    Respirations 14 14    O2 Sat 96 97    S/S 6 6    Pain Level 4/10 0/10     D/C home with wife, [name] & O X 3, D/C instructions reviewed, and sits independently.

## 2017-06-06 ENCOUNTER — Other Ambulatory Visit: Payer: Self-pay | Admitting: Internal Medicine

## 2017-06-06 DIAGNOSIS — I714 Abdominal aortic aneurysm, without rupture, unspecified: Secondary | ICD-10-CM

## 2017-06-13 ENCOUNTER — Other Ambulatory Visit: Payer: Self-pay | Admitting: Physical Medicine & Rehabilitation

## 2017-06-19 ENCOUNTER — Ambulatory Visit (HOSPITAL_COMMUNITY)
Admission: RE | Admit: 2017-06-19 | Discharge: 2017-06-19 | Disposition: A | Discharge status: Home / Self Care | Payer: Medicare Other | Source: Ambulatory Visit | Attending: Cardiovascular Disease | Admitting: Cardiovascular Disease

## 2017-06-19 DIAGNOSIS — I714 Abdominal aortic aneurysm, without rupture, unspecified: Secondary | ICD-10-CM

## 2017-06-19 DIAGNOSIS — I771 Stricture of artery: Secondary | ICD-10-CM | POA: Insufficient documentation

## 2017-06-19 DIAGNOSIS — I77811 Abdominal aortic ectasia: Secondary | ICD-10-CM | POA: Insufficient documentation

## 2017-06-30 ENCOUNTER — Encounter: Payer: Self-pay | Admitting: Internal Medicine

## 2017-06-30 ENCOUNTER — Ambulatory Visit: Payer: Medicare Other | Admitting: Internal Medicine

## 2017-06-30 ENCOUNTER — Ambulatory Visit: Payer: Self-pay | Admitting: Internal Medicine

## 2017-06-30 VITALS — BP 158/72 | HR 82 | Ht 72.0 in | Wt 191.0 lb

## 2017-06-30 DIAGNOSIS — R935 Abnormal findings on diagnostic imaging of other abdominal regions, including retroperitoneum: Secondary | ICD-10-CM | POA: Diagnosis not present

## 2017-06-30 DIAGNOSIS — R109 Unspecified abdominal pain: Secondary | ICD-10-CM | POA: Diagnosis not present

## 2017-06-30 NOTE — Progress Notes (Signed)
HISTORY OF PRESENT ILLNESS:  [name] is a 84 y.o. male with past medical history as listed below who is sent today for posthospitalization follow-up. He has had prior colonoscopy July 2012 which was normal, save small AVM. He was in his usual state of health until 05/17/2017 when he was awoken at 2 AM with acute abdominal pain which persisted. He was seen in the emergency room. Found to have an elevated white blood cell count. CT scan suggested closed-loop obstruction or volvulus. She underwent laparoscopy. He was found to have no mechanical obstruction at the time of surgery. However the midportion of his jejunum was swollen and erythematous. I have reviewed the operative report as well as the intraoperative photographs. His pain improved postoperatively. He was discharged home and has had surgical follow-up. Patient has had no recurrent problems with pain since. Eating well and moving his bowels. He is using MiraLAX for chronic constipation. He is accompanied by his wife. His only other complaint is some vague esophageal sensation from his NG tube which is minor and improving  REVIEW OF SYSTEMS:  All non-GI ROS negative unless otherwise stated in the history of present illness except for neuropathy, back pain, headaches,  Past Medical History:  Diagnosis Date  . 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 Colon polyps   . Coronary atherosclerosis of unspecified type of vessel, native or graft   . Difficult intubation   . Esophageal reflux   . Hypertension   . IBS (irritable bowel syndrome)   . Orthostatic hypotension    "BP has been dropping alot when I stand up for the last month or so" (02/17/2016)  . Vitamin B 12 deficiency   . Vitamin D deficiency     Past Surgical History:  Procedure Laterality Date  . BLADDER SURGERY  1969   traumatic pelvic fractures, urethral and bladder repair  . CARDIAC CATHETERIZATION N/A 07/01/2015   Procedure: Left Heart Cath and Coronary Angiography;   Surgeon: Wellington Hampshire, MD;  Location: [facility];  Service: Cardiovascular;  Laterality: N/A;  . COLON RESECTION N/A 05/17/2017   Procedure: DIAGNOSTIC LAPAROSCOPY,;  Surgeon: Leighton Ruff, MD;  Location: [facility];  Service: General;  Laterality: N/A;  . CORONARY ANGIOPLASTY WITH STENT PLACEMENT    . CORONARY ARTERY BYPASS GRAFT N/A 07/06/2015   Procedure: CORONARY ARTERY BYPASS GRAFTING (CABG)x 4   utilizing the left internal mammary artery and endoscopically harvested bilateral  sapheneous vein.;  Surgeon: Ivin Poot, MD;  Location: [facility];  Service: Open Heart Surgery;  Laterality: N/A;  . KNEE SURGERY    . TEE WITHOUT CARDIOVERSION N/A 07/06/2015   Procedure: TRANSESOPHAGEAL ECHOCARDIOGRAM (TEE);  Surgeon: Ivin Poot, MD;  Location: [facility];  Service: Open Heart Surgery;  Laterality: N/A;    Social History [name]  reports that he quit smoking about 53 years ago. he has never used smokeless tobacco. He reports that he does not drink alcohol or use drugs.  family history includes Colon cancer in his sister; Colon polyps in his sister; Diabetes in his maternal grandmother; Heart attack in his brother, father, and sister; Liver cancer in his sister.  Allergies  Allergen Reactions  . Cymbalta [Duloxetine Hcl]     Dizziness  . Keflex [Cephalexin] Other (See Comments)    Reaction: unknown   . Simvastatin Other (See Comments)    Reaction: unknown   . Sudafed [Pseudoephedrine]     Dizziness  PHYSICAL EXAMINATION: Vital signs: BP (!) 158/72   Pulse 82   Ht 6' (1.829 m)   Wt 191 lb (86.6 kg)   BMI 25.90 kg/m   Constitutional: generally well-appearing, no acute distress Psychiatric: alert and oriented x3, cooperative Eyes: extraocular movements intact, anicteric, conjunctiva pink Mouth: oral pharynx moist, no lesions Neck: supple no lymphadenopathy Cardiovascular: heart regular rate and rhythm, no murmur Lungs: clear to auscultation  bilaterally Abdomen: soft, nontender, nondistended, no obvious ascites, no peritoneal signs, normal bowel sounds, no organomegaly. Recent surgical incisions well-healed Rectal:. Elevated Extremities: no clubbing, cyanosis, or lower extremity edema bilaterally Skin: no lesions on visible extremities Neuro: No focal deficits. Peripheral neuropathy  ASSESSMENT:  #1. Recent hospitalization with acute abdominal pain and abnormal CT scan and intraoperative findings as described. The patient almost certainly had transient mechanical obstruction with transient ischemia which had resolved at the time of surgery. Fortunately, he had viable bowel and did not require resection. This is not an infection. This is not inflammatory bowel disease. This is not primary ischemia. He had no problems prior to his presentation and none since his discharge. Negative colonoscopy 2012 as described #2. Functional constipation. Managed with MiraLAX #3. History of GERD. On famotidine   PLAN:  #1. We discussed advanced imaging such as CT enterography is suggested when he was still hospitalized. However, given his lack of symptoms and the above discussion I do not think this is necessary unless he has recurrent problems. He does contact the office for such. Otherwise he will continue on his MiraLAX and famotidine for his other GI diagnoses.  25 minutes of face-to-face with the patient. Greater than 50% a time use for counseling regarding his recent problems with abdominal pain and likely transient obstructive process

## 2017-06-30 NOTE — Patient Instructions (Signed)
Please follow up as needed 

## 2017-07-03 ENCOUNTER — Ambulatory Visit: Payer: Medicare Other | Admitting: Physical Medicine & Rehabilitation

## 2017-07-03 ENCOUNTER — Encounter: Payer: Medicare Other | Attending: Physical Medicine & Rehabilitation

## 2017-07-03 ENCOUNTER — Encounter: Payer: Self-pay | Admitting: Physical Medicine & Rehabilitation

## 2017-07-03 VITALS — BP 164/87 | HR 77 | Resp 14

## 2017-07-03 DIAGNOSIS — Z8249 Family history of ischemic heart disease and other diseases of the circulatory system: Secondary | ICD-10-CM | POA: Diagnosis not present

## 2017-07-03 DIAGNOSIS — E1142 Type 2 diabetes mellitus with diabetic polyneuropathy: Secondary | ICD-10-CM | POA: Diagnosis not present

## 2017-07-03 DIAGNOSIS — M4185 Other forms of scoliosis, thoracolumbar region: Secondary | ICD-10-CM | POA: Diagnosis not present

## 2017-07-03 DIAGNOSIS — K589 Irritable bowel syndrome without diarrhea: Secondary | ICD-10-CM | POA: Insufficient documentation

## 2017-07-03 DIAGNOSIS — Z951 Presence of aortocoronary bypass graft: Secondary | ICD-10-CM | POA: Insufficient documentation

## 2017-07-03 DIAGNOSIS — Z79899 Other long term (current) drug therapy: Secondary | ICD-10-CM | POA: Diagnosis not present

## 2017-07-03 DIAGNOSIS — I2510 Atherosclerotic heart disease of native coronary artery without angina pectoris: Secondary | ICD-10-CM | POA: Diagnosis not present

## 2017-07-03 DIAGNOSIS — E559 Vitamin D deficiency, unspecified: Secondary | ICD-10-CM | POA: Insufficient documentation

## 2017-07-03 DIAGNOSIS — Z87891 Personal history of nicotine dependence: Secondary | ICD-10-CM | POA: Insufficient documentation

## 2017-07-03 DIAGNOSIS — G588 Other specified mononeuropathies: Secondary | ICD-10-CM | POA: Insufficient documentation

## 2017-07-03 DIAGNOSIS — I10 Essential (primary) hypertension: Secondary | ICD-10-CM | POA: Diagnosis not present

## 2017-07-03 DIAGNOSIS — Z955 Presence of coronary angioplasty implant and graft: Secondary | ICD-10-CM | POA: Insufficient documentation

## 2017-07-03 DIAGNOSIS — M545 Low back pain: Secondary | ICD-10-CM | POA: Diagnosis not present

## 2017-07-03 DIAGNOSIS — R0789 Other chest pain: Secondary | ICD-10-CM | POA: Diagnosis not present

## 2017-07-03 DIAGNOSIS — E785 Hyperlipidemia, unspecified: Secondary | ICD-10-CM | POA: Insufficient documentation

## 2017-07-03 DIAGNOSIS — K219 Gastro-esophageal reflux disease without esophagitis: Secondary | ICD-10-CM | POA: Diagnosis not present

## 2017-07-03 DIAGNOSIS — M47816 Spondylosis without myelopathy or radiculopathy, lumbar region: Secondary | ICD-10-CM | POA: Diagnosis not present

## 2017-07-03 DIAGNOSIS — G8929 Other chronic pain: Secondary | ICD-10-CM | POA: Diagnosis not present

## 2017-07-03 NOTE — Progress Notes (Signed)
  [facility]   Name: [name] DOB:May 08, 1933 MRN: [id number]  Date:07/03/2017  Physician: Alysia Penna, MD    Nurse/CMA: Josha Weekley, CMA   Allergies:  Allergies  Allergen Reactions  . Cymbalta [Duloxetine Hcl]     Dizziness  . Keflex [Cephalexin] Other (See Comments)    Reaction: unknown   . Simvastatin Other (See Comments)    Reaction: unknown   . Sudafed [Pseudoephedrine]     Dizziness    Consent Signed: Yes.    Is patient diabetic? Yes.    CBG today? 118  Pregnant: No. LMP: No LMP for male patient. (age 82-55)  Anticoagulants: no Anti-inflammatory: no Antibiotics: no  Procedure: right medial branch block  Position: Prone Start Time: 9:18am      End Time: 9:24am  Fluoro Time: 26  RN/CMA Shadrach Bartunek, CMA Dynastie Knoop, CMA    Time 8:55am 9:29am    BP 164/87 187/92    Pulse 77 81    Respirations 14 14    O2 Sat 94 98    S/S 6 6    Pain Level 4/10 0/10     D/C home with wife, [name] & O X 3, D/C instructions reviewed, and sits independently.

## 2017-07-03 NOTE — Patient Instructions (Signed)
Lumbar medial branch blocks were performed. This is to help diagnose the cause of the low back pain. It is important that you keep track of your pain for the first day or 2 after injection. This injection can give you temporary relief that lasts for hours or up to several months. There is no way to predict duration of pain relief.  Please try to compare your pain after injection to for the injection.  We did upper lumbar blocks last month which are still working. We performed Lower lumbar blocks today Will schedule in one month to repeat upper lumbar as they may wear of between now and then   If this injection gives you  temporary relief there may be another longer-lasting procedure that may be beneficial call radiofrequency ablation

## 2017-07-03 NOTE — Progress Notes (Signed)
Right lumbar L3, L4 medial branch blocks and L5 dorsal ramus injection under fluoroscopic guidance  Indication: Right Lumbar pain which is not relieved by medication management or other conservative care and interfering with self-care and mobility.  Informed consent was obtained after describing risks and benefits of the procedure with the patient, this includes bleeding, bruising, infection, paralysis and medication side effects. The patient wishes to proceed and has given written consent. The patient was placed in a prone position. The lumbar area was marked and prepped with Betadine. One ML of 1% lidocaine was injected into each of 3 areas into the skin and subcutaneous tissue. Then a 22-gauge 3.5incch spinal needle was inserted targeting the junction of the Right S1 superior articular process and sacral ala junction. Needle was advanced under fluoroscopic guidance. Bone contact was made.Isovue 200 was injected x0.5 mL demonstrating no intravascular uptake. Then a solution containing 2% MPF lidocaine was injected x0.5 mL. Then the Right L5 superior articular process in transverse process junction was targeted. Bone contact was made.Isovue 200 was injected x0.5 mL demonstrating no intravascular uptake. Then a solution containing 2% MPF lidocaine was injected x0.5 mL. Then the Right L4 superior articular process in transverse process junction was targeted. Bone contact was made. Isovue 200 was injected x0.5 mL demonstrating no intravascular uptake. Then a solution containing2% MPF lidocaine was injected x0.5 mL Patient tolerated procedure well. Post procedure instructions were given. Please refer to post procedure form.

## 2017-07-06 ENCOUNTER — Ambulatory Visit: Payer: Medicare Other | Admitting: Internal Medicine

## 2017-07-06 VITALS — BP 146/90 | HR 68 | Temp 97.8°F | Resp 18 | Ht 72.0 in | Wt 191.2 lb

## 2017-07-06 DIAGNOSIS — I951 Orthostatic hypotension: Secondary | ICD-10-CM

## 2017-07-06 DIAGNOSIS — I25110 Atherosclerotic heart disease of native coronary artery with unstable angina pectoris: Secondary | ICD-10-CM

## 2017-07-06 DIAGNOSIS — E782 Mixed hyperlipidemia: Secondary | ICD-10-CM

## 2017-07-06 DIAGNOSIS — E119 Type 2 diabetes mellitus without complications: Secondary | ICD-10-CM | POA: Diagnosis not present

## 2017-07-06 DIAGNOSIS — Z79899 Other long term (current) drug therapy: Secondary | ICD-10-CM

## 2017-07-06 DIAGNOSIS — D485 Neoplasm of uncertain behavior of skin: Secondary | ICD-10-CM | POA: Diagnosis not present

## 2017-07-06 DIAGNOSIS — E559 Vitamin D deficiency, unspecified: Secondary | ICD-10-CM

## 2017-07-06 NOTE — Progress Notes (Signed)
This very nice 84 y.o. MWM presents for 6 month follow up with HTN, HLD, Pre-Diabetes and Vitamin D Deficiency. Patient was hospitalized in January with a partial SBO and had a Lap w/o obstruction identified and then managed conservatively and patient 's GI sx's have been nil since that time.      Patient was treated in July 2018 in the Left vertex scalp area with Liq Nitrogen cryosurgery for a suspect superficial non healing ulceration which has yet to heal .      Patient has a hx/o labile HTN (1995) developing over the last several yeas to dysautonomia wit supine HTN and severe postural Hypotension managed with Florinef which his wife titrates to maintain standing BP. Patient does have hx/o ASCAD s/p PTCA x 2 (2005,2011) and lastly CABG in 2017.      Patient is treated for HTN & BP has been controlled at home. Today's BP is  146/90-stand and 120/80 sit. Patient has had no complaints of any cardiac type chest pain, palpitations, dyspnea / orthopnea / PND, dizziness, claudication, or dependent edema.     Hyperlipidemia is not controlled with diet & meds. Patient denies myalgias or other med SE's. Last Lipids were not at goal: Lab Results  Component Value Date   CHOL 206 (H) 04/04/2017   HDL 33 (L) 04/04/2017   LDLCALC 110 (H) 11/29/2016   TRIG 229 (H) 04/04/2017   CHOLHDL 6.2 (H) 04/04/2017      Also, the patient has history of PreDiabetes (2008) and has had no symptoms of reactive hypoglycemia, diabetic polys, paresthesias or visual blurring.  Last A1c was still not at goal: Lab Results  Component Value Date   HGBA1C 6.1 (H) 04/04/2017      Further, the patient also has history of Vitamin D Deficiency and supplements vitamin D without any suspected side-effects. Last vitamin D was still not at goal (70-100):   Lab Results  Component Value Date   VD25OH 54 04/04/2017   Current Outpatient Medications on File Prior to Visit  Medication Sig  . diazepam (VALIUM) 5 MG tablet Take 5 mg  by mouth every 6 (six) hours as needed for anxiety.  Marland Kitchen acetaminophen (TYLENOL) 325 MG tablet Take 2 tablets (650 mg total) by mouth every 6 (six) hours as needed for mild pain or headache (or Fever >/= 101).  Marland Kitchen aspirin EC 81 MG EC tablet Take 1 tablet (81 mg total) by mouth daily.  . butalbital-acetaminophen-caffeine (FIORICET, ESGIC) 50-325-40 MG tablet Take 1-2 tablets by mouth every 6 (six) hours as needed for headache.  Marland Kitchen CALCIUM PO Take 1 tablet by mouth daily.  . Cholecalciferol (VITAMIN D PO) Take 2,000 Units by mouth 3 (three) times daily.   . cyanocobalamin 1000 MCG tablet Take 1,000 mcg by mouth daily.  . diclofenac sodium (VOLTAREN) 1 % GEL APPLY TOPICALLY TO RIBS, BACK THREE TIMES DAILY  . famotidine (PEPCID) 20 MG tablet Take 1 tablet (20 mg total) by mouth 2 (two) times daily.  . fludrocortisone (FLORINEF) 0.1 MG tablet TAKE 2 TABLETS(200 MCG) BY MOUTH DAILY  . gabapentin (NEURONTIN) 100 MG capsule TAKE 1 CAPSULE(100 MG) BY MOUTH THREE TIMES DAILY  . multivitamin (THERAGRAN) per tablet Take 1 tablet by mouth daily.    . polyethylene glycol (MIRALAX / GLYCOLAX) packet Take 17 g by mouth daily.  Marland Kitchen senna (SENOKOT) 8.6 MG TABS tablet Take 1 tablet (8.6 mg total) by mouth 2 (two) times daily.   No current facility-administered  medications on file prior to visit.    Allergies  Allergen Reactions  . Cymbalta [Duloxetine Hcl]     Dizziness  . Keflex [Cephalexin] Other (See Comments)    Reaction: unknown   . Simvastatin Other (See Comments)    Reaction: unknown   . Sudafed [Pseudoephedrine]     Dizziness   PMHx:   Past Medical History:  Diagnosis Date  . 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 Colon polyps   . Coronary atherosclerosis of unspecified type of vessel, native or graft   . Difficult intubation   . Esophageal reflux   . Hypertension   . IBS (irritable bowel syndrome)   . Orthostatic hypotension    "BP has been dropping alot when I stand up for the last month or so"  (02/17/2016)  . Vitamin B 12 deficiency   . Vitamin D deficiency    Immunization History  Administered Date(s) Administered  . Influenza, High Dose Seasonal PF 12/30/2013, 02/02/2015, 01/27/2016  . Influenza-Unspecified 01/13/2013, 01/29/2017  . Pneumococcal Conjugate-13 02/14/2014  . Pneumococcal-Unspecified 06/15/2004  . Tetanus 10/01/2012  . Zoster 10/01/2012   Past Surgical History:  Procedure Laterality Date  . BLADDER SURGERY  1969   traumatic pelvic fractures, urethral and bladder repair  . CARDIAC CATHETERIZATION N/A 07/01/2015   Procedure: Left Heart Cath and Coronary Angiography;  Surgeon: Wellington Hampshire, MD;  Location: [facility];  Service: Cardiovascular;  Laterality: N/A;  . COLON RESECTION N/A 05/17/2017   Procedure: DIAGNOSTIC LAPAROSCOPY,;  Surgeon: Leighton Ruff, MD;  Location: [facility];  Service: General;  Laterality: N/A;  . CORONARY ANGIOPLASTY WITH STENT PLACEMENT    . CORONARY ARTERY BYPASS GRAFT N/A 07/06/2015   Procedure: CORONARY ARTERY BYPASS GRAFTING (CABG)x 4   utilizing the left internal mammary artery and endoscopically harvested bilateral  sapheneous vein.;  Surgeon: Ivin Poot, MD;  Location: [facility];  Service: Open Heart Surgery;  Laterality: N/A;  . KNEE SURGERY    . TEE WITHOUT CARDIOVERSION N/A 07/06/2015   Procedure: TRANSESOPHAGEAL ECHOCARDIOGRAM (TEE);  Surgeon: Ivin Poot, MD;  Location: [facility];  Service: Open Heart Surgery;  Laterality: N/A;   FHx:    Reviewed / unchanged  SHx:    Reviewed / unchanged  Systems Review:  Constitutional: Denies fever, chills, wt changes, headaches, insomnia, fatigue, night sweats, change in appetite. Eyes: Denies redness, blurred vision, diplopia, discharge, itchy, watery eyes.  ENT: Denies discharge, congestion, post nasal drip, epistaxis, sore throat, earache, hearing loss, dental pain, tinnitus, vertigo, sinus pain, snoring.  CV: Denies chest pain, palpitations, irregular heartbeat, syncope,  dyspnea, diaphoresis, orthopnea, PND, claudication or edema. Respiratory: denies cough, dyspnea, DOE, pleurisy, hoarseness, laryngitis, wheezing.  Gastrointestinal: Denies dysphagia, odynophagia, heartburn, reflux, water brash, abdominal pain or cramps, nausea, vomiting, bloating, diarrhea, constipation, hematemesis, melena, hematochezia  or hemorrhoids. Genitourinary: Denies dysuria, frequency, urgency, nocturia, hesitancy, discharge, hematuria or flank pain. Musculoskeletal: Denies arthralgias, myalgias, stiffness, jt. swelling, pain, limping or strain/sprain.  Skin: Denies pruritus, rash, hives, warts, acne, eczema or change in skin lesion(s). Neuro: No weakness, tremor, incoordination, spasms, paresthesia or pain. Psychiatric: Denies confusion, memory loss or sensory loss. Endo: Denies change in weight, skin or hair change.  Heme/Lymph: No excessive bleeding, bruising or enlarged lymph nodes.  Physical Exam  BP (!) 146/90 Comment: 146/90-stand and 120/80 sit  Pulse 68   Temp 97.8 F (36.6 C)   Resp 18   Ht 6' (1.829 m)   Wt 191 lb 3.2 oz (86.7  kg)   BMI 25.93 kg/m   Appears  well nourished, well groomed  and in no distress.  Eyes: PERRLA, EOMs, conjunctiva no swelling or erythema. Sinuses: No frontal/maxillary tenderness ENT/Mouth: EAC's clear, TM's nl w/o erythema, bulging. Nares clear w/o erythema, swelling, exudates. Oropharynx clear without erythema or exudates. Oral hygiene is good. Tongue normal, non obstructing. Hearing intact.  Neck: Supple. Thyroid not palpable. Car 2+/2+ without bruits, nodes or JVD. Chest: Respirations nl with BS clear & equal w/o rales, rhonchi, wheezing or stridor.  Cor: Heart sounds normal w/ regular rate and rhythm without sig. murmurs, gallops, clicks or rubs. Peripheral pulses normal and equal  without edema.  Abdomen: Soft & bowel sounds normal. Non-tender w/o guarding, rebound, hernias, masses or organomegaly.  Lymphatics: Unremarkable.    Musculoskeletal: Full ROM all peripheral extremities, joint stability, 5/5 strength and normal gait.  Skin:        Crusted scabbed lesion of the Left Vertex Scalp. Neuro: Cranial nerves intact, reflexes equal bilaterally. Sensory-motor testing grossly intact. Tendon reflexes grossly intact.  Pysch: Alert & oriented x 3.  Insight and judgement nl & appropriate. No ideations.  Assessment and Plan:  1. Orthostatic hypotension / Dysautonomia  - Continue Florinef, monitor blood pressure at home.  - Continue DASH diet. Reminder to go to the ER if any CP,  SOB, nausea, dizziness, severe HA, changes vision/speech.  - CBC with Differential/Platelet - BASIC METABOLIC PANEL WITH GFR - Magnesium - TSH  2. Mixed hyperlipidemia  - Continue diet/meds, exercise,& lifestyle modifications.  - Continue monitor periodic cholesterol/liver & renal functions   - Hepatic function panel - Lipid panel - TSH  3. Diabetes mellitus type 2, diet-controlled ([place])  - Continue diet, exercise, lifestyle modifications.  - Monitor appropriate labs.  - Hemoglobin A1c - Insulin, random  4. Vitamin D deficiency  - Continue supplementation.  - VITAMIN D 25 Hydroxyl  5. Atherosclerosis of native coronary artery of native heart with unstable angina pectoris ([place])  6. Neoplasm of uncertain behavior of skin  - Refer to [facility] center  7. Medication management  - CBC with Differential/Platelet - BASIC METABOLIC PANEL WITH GFR - Hepatic function panel - Magnesium - Lipid panel - TSH - Hemoglobin A1c - Insulin, random - VITAMIN D 25 Hydroxy        Discussed  regular exercise, BP monitoring, weight control to achieve/maintain BMI less than 25 and discussed med and SE's. Recommended labs to assess and monitor clinical status with further disposition pending results of labs. Over 30 minutes of exam, counseling, chart review was performed.

## 2017-07-06 NOTE — Patient Instructions (Signed)

## 2017-07-07 ENCOUNTER — Encounter: Payer: Self-pay | Admitting: Internal Medicine

## 2017-07-07 ENCOUNTER — Other Ambulatory Visit: Payer: Self-pay | Admitting: Internal Medicine

## 2017-07-07 ENCOUNTER — Other Ambulatory Visit: Payer: Self-pay | Admitting: *Deleted

## 2017-07-07 LAB — CBC WITH DIFFERENTIAL/PLATELET
Basophils Absolute: 52 cells/uL (ref 0–200)
Basophils Relative: 0.6 %
Eosinophils Absolute: 218 cells/uL (ref 15–500)
Eosinophils Relative: 2.5 %
HCT: 40.8 % (ref 38.5–50.0)
Hemoglobin: 13.5 g/dL (ref 13.2–17.1)
Lymphs Abs: 2714 cells/uL (ref 850–3900)
MCH: 31.1 pg (ref 27.0–33.0)
MCHC: 33.1 g/dL (ref 32.0–36.0)
MCV: 94 fL (ref 80.0–100.0)
MPV: 9.7 fL (ref 7.5–12.5)
Monocytes Relative: 6.4 %
Neutro Abs: 5159 cells/uL (ref 1500–7800)
Neutrophils Relative %: 59.3 %
Platelets: 267 10*3/uL (ref 140–400)
RBC: 4.34 10*6/uL (ref 4.20–5.80)
RDW: 13.9 % (ref 11.0–15.0)
Total Lymphocyte: 31.2 %
WBC mixed population: 557 cells/uL (ref 200–950)
WBC: 8.7 10*3/uL (ref 3.8–10.8)

## 2017-07-07 LAB — HEPATIC FUNCTION PANEL
AG Ratio: 1.6 (calc) (ref 1.0–2.5)
ALT: 10 U/L (ref 9–46)
AST: 19 U/L (ref 10–35)
Albumin: 4.3 g/dL (ref 3.6–5.1)
Alkaline phosphatase (APISO): 79 U/L (ref 40–115)
Bilirubin, Direct: 0.1 mg/dL (ref 0.0–0.2)
Globulin: 2.7 g/dL (calc) (ref 1.9–3.7)
Indirect Bilirubin: 0.5 mg/dL (calc) (ref 0.2–1.2)
Total Bilirubin: 0.6 mg/dL (ref 0.2–1.2)
Total Protein: 7 g/dL (ref 6.1–8.1)

## 2017-07-07 LAB — HEMOGLOBIN A1C
Hgb A1c MFr Bld: 6 % of total Hgb — ABNORMAL HIGH (ref <5.7)
Mean Plasma Glucose: 126 (calc)
eAG (mmol/L): 7 (calc)

## 2017-07-07 LAB — LIPID PANEL
Cholesterol: 203 mg/dL — ABNORMAL HIGH (ref <200)
HDL: 34 mg/dL — ABNORMAL LOW (ref >40)
LDL Cholesterol (Calc): 133 mg/dL (calc) — ABNORMAL HIGH
Non-HDL Cholesterol (Calc): 169 mg/dL (calc) — ABNORMAL HIGH (ref <130)
Total CHOL/HDL Ratio: 6 (calc) — ABNORMAL HIGH (ref <5.0)
Triglycerides: 217 mg/dL — ABNORMAL HIGH (ref <150)

## 2017-07-07 LAB — VITAMIN D 25 HYDROXY (VIT D DEFICIENCY, FRACTURES): Vit D, 25-Hydroxy: 71 ng/mL (ref 30–100)

## 2017-07-07 LAB — MAGNESIUM: Magnesium: 2.3 mg/dL (ref 1.5–2.5)

## 2017-07-07 LAB — BASIC METABOLIC PANEL WITH GFR
BUN/Creatinine Ratio: 14 (calc) (ref 6–22)
BUN: 18 mg/dL (ref 7–25)
CO2: 32 mmol/L (ref 20–32)
Calcium: 10.1 mg/dL (ref 8.6–10.3)
Chloride: 103 mmol/L (ref 98–110)
Creat: 1.27 mg/dL — ABNORMAL HIGH (ref 0.70–1.11)
GFR, Est African American: 61 mL/min/{1.73_m2} (ref >=60)
GFR, Est Non African American: 52 mL/min/{1.73_m2} — ABNORMAL LOW (ref >=60)
Glucose, Bld: 112 mg/dL — ABNORMAL HIGH (ref 65–99)
Potassium: 4.8 mmol/L (ref 3.5–5.3)
Sodium: 143 mmol/L (ref 135–146)

## 2017-07-07 LAB — INSULIN, RANDOM: Insulin: 6.6 u[IU]/mL (ref 2.0–19.6)

## 2017-07-07 LAB — TSH: TSH: 2.26 mIU/L (ref 0.40–4.50)

## 2017-07-07 MED ORDER — GLUCOSE BLOOD VI STRP: ORAL_STRIP | 5 refills | Status: DC

## 2017-07-25 ENCOUNTER — Encounter: Payer: Self-pay | Admitting: Internal Medicine

## 2017-08-01 ENCOUNTER — Encounter: Payer: Self-pay | Admitting: Physical Medicine & Rehabilitation

## 2017-08-01 ENCOUNTER — Ambulatory Visit: Payer: Medicare Other | Admitting: Physical Medicine & Rehabilitation

## 2017-08-01 ENCOUNTER — Encounter: Payer: Medicare Other | Attending: Physical Medicine & Rehabilitation

## 2017-08-01 VITALS — BP 167/90 | HR 75 | Resp 14 | Ht 72.0 in | Wt 197.0 lb

## 2017-08-01 DIAGNOSIS — Z8249 Family history of ischemic heart disease and other diseases of the circulatory system: Secondary | ICD-10-CM | POA: Insufficient documentation

## 2017-08-01 DIAGNOSIS — Z951 Presence of aortocoronary bypass graft: Secondary | ICD-10-CM | POA: Insufficient documentation

## 2017-08-01 DIAGNOSIS — E1142 Type 2 diabetes mellitus with diabetic polyneuropathy: Secondary | ICD-10-CM | POA: Insufficient documentation

## 2017-08-01 DIAGNOSIS — M545 Low back pain: Secondary | ICD-10-CM | POA: Diagnosis not present

## 2017-08-01 DIAGNOSIS — K219 Gastro-esophageal reflux disease without esophagitis: Secondary | ICD-10-CM | POA: Insufficient documentation

## 2017-08-01 DIAGNOSIS — I2510 Atherosclerotic heart disease of native coronary artery without angina pectoris: Secondary | ICD-10-CM | POA: Insufficient documentation

## 2017-08-01 DIAGNOSIS — K589 Irritable bowel syndrome without diarrhea: Secondary | ICD-10-CM | POA: Insufficient documentation

## 2017-08-01 DIAGNOSIS — Z955 Presence of coronary angioplasty implant and graft: Secondary | ICD-10-CM | POA: Insufficient documentation

## 2017-08-01 DIAGNOSIS — M4185 Other forms of scoliosis, thoracolumbar region: Secondary | ICD-10-CM | POA: Insufficient documentation

## 2017-08-01 DIAGNOSIS — R0789 Other chest pain: Secondary | ICD-10-CM | POA: Diagnosis not present

## 2017-08-01 DIAGNOSIS — Z79899 Other long term (current) drug therapy: Secondary | ICD-10-CM | POA: Insufficient documentation

## 2017-08-01 DIAGNOSIS — G8929 Other chronic pain: Secondary | ICD-10-CM | POA: Diagnosis not present

## 2017-08-01 DIAGNOSIS — G588 Other specified mononeuropathies: Secondary | ICD-10-CM | POA: Insufficient documentation

## 2017-08-01 DIAGNOSIS — E785 Hyperlipidemia, unspecified: Secondary | ICD-10-CM | POA: Diagnosis not present

## 2017-08-01 DIAGNOSIS — Z87891 Personal history of nicotine dependence: Secondary | ICD-10-CM | POA: Diagnosis not present

## 2017-08-01 DIAGNOSIS — E559 Vitamin D deficiency, unspecified: Secondary | ICD-10-CM | POA: Insufficient documentation

## 2017-08-01 DIAGNOSIS — M47816 Spondylosis without myelopathy or radiculopathy, lumbar region: Secondary | ICD-10-CM

## 2017-08-01 DIAGNOSIS — I10 Essential (primary) hypertension: Secondary | ICD-10-CM | POA: Diagnosis not present

## 2017-08-01 NOTE — Progress Notes (Signed)
  [facility]   Name: [name] DOB:1934/02/05 MRN: [id number]  Date:08/01/2017  Physician: Alysia Penna, MD    Nurse/CMA: Jun Osment, CMA   Allergies:  Allergies  Allergen Reactions  . Cymbalta [Duloxetine Hcl]     Dizziness  . Keflex [Cephalexin] Other (See Comments)    Reaction: unknown   . Simvastatin Other (See Comments)    Reaction: unknown   . Sudafed [Pseudoephedrine]     Dizziness    Consent Signed: Yes.    Is patient diabetic? Yes.    CBG today? 117  Pregnant: No. LMP: No LMP for male patient. (age 54-55)  Anticoagulants: no Anti-inflammatory: no Antibiotics: no  Procedure: right T12, L1, L2 medial branch blocks Position: Prone Start Time: 9:29am  End Time: 9:37am  Fluoro Time: 36s  RN/CMA Cephas Revard, CMA Aiyanna Awtrey, CMA    Time 8:55am 9:42am    BP 167/90 188/111    Pulse 75 79    Respirations 14 14    O2 Sat 97 93    S/S 6 6    Pain Level 4/10 0/10     D/C home with wife, [name] & O X 3, D/C instructions reviewed, and sits independently.

## 2017-08-01 NOTE — Patient Instructions (Signed)

## 2017-08-08 DIAGNOSIS — M9902 Segmental and somatic dysfunction of thoracic region: Secondary | ICD-10-CM | POA: Diagnosis not present

## 2017-08-08 DIAGNOSIS — M5134 Other intervertebral disc degeneration, thoracic region: Secondary | ICD-10-CM | POA: Diagnosis not present

## 2017-08-30 ENCOUNTER — Ambulatory Visit: Payer: Medicare Other | Admitting: Internal Medicine

## 2017-08-30 VITALS — BP 124/80 | HR 72 | Temp 97.3°F | Resp 18 | Ht 72.0 in | Wt 199.6 lb

## 2017-08-30 DIAGNOSIS — I10 Essential (primary) hypertension: Secondary | ICD-10-CM | POA: Diagnosis not present

## 2017-08-30 DIAGNOSIS — Z79899 Other long term (current) drug therapy: Secondary | ICD-10-CM

## 2017-08-30 DIAGNOSIS — I951 Orthostatic hypotension: Secondary | ICD-10-CM

## 2017-08-30 DIAGNOSIS — R002 Palpitations: Secondary | ICD-10-CM | POA: Diagnosis not present

## 2017-08-30 DIAGNOSIS — G903 Multi-system degeneration of the autonomic nervous system: Secondary | ICD-10-CM | POA: Diagnosis not present

## 2017-08-30 LAB — CBC WITH DIFFERENTIAL/PLATELET
Basophils Absolute: 53 cells/uL (ref 0–200)
Basophils Relative: 0.7 %
Eosinophils Absolute: 308 cells/uL (ref 15–500)
Eosinophils Relative: 4.1 %
HCT: 36.7 % — ABNORMAL LOW (ref 38.5–50.0)
Hemoglobin: 11.9 g/dL — ABNORMAL LOW (ref 13.2–17.1)
Lymphs Abs: 2528 cells/uL (ref 850–3900)
MCH: 29.9 pg (ref 27.0–33.0)
MCHC: 32.4 g/dL (ref 32.0–36.0)
MCV: 92.2 fL (ref 80.0–100.0)
MPV: 9.8 fL (ref 7.5–12.5)
Monocytes Relative: 7.5 %
Neutro Abs: 4050 cells/uL (ref 1500–7800)
Neutrophils Relative %: 54 %
Platelets: 249 10*3/uL (ref 140–400)
RBC: 3.98 10*6/uL — ABNORMAL LOW (ref 4.20–5.80)
RDW: 12.7 % (ref 11.0–15.0)
Total Lymphocyte: 33.7 %
WBC mixed population: 563 cells/uL (ref 200–950)
WBC: 7.5 10*3/uL (ref 3.8–10.8)

## 2017-08-30 LAB — TSH: TSH: 2.36 mIU/L (ref 0.40–4.50)

## 2017-08-30 MED ORDER — VERAPAMIL HCL ER 240 MG PO TBCR: EXTENDED_RELEASE_TABLET | ORAL | 3 refills | Status: DC

## 2017-08-30 NOTE — Progress Notes (Signed)
Subjective:    Patient ID: [name], male    DOB: 1933/09/11, 84 y.o.   MRN: [id number]  HPI  This very nice 84 yo MWM w/hx/o Labile HTN/orthostatic Hypotension, ASCAD s/p PCA has  Been having recent palpitations. He's been on florinef which his wife titrates to maintain normal standing BP' accepting a degree of supine Hypertension. Home BP Sitting 147/85 and Standing BP 106/63.  Medication Sig  . acetaminophen (TYLENOL) 325 MG tablet Take 2 tablets (650 mg total) by mouth every 6 (six) hours as needed for mild pain or headache (or Fever >/= 101).  Marland Kitchen aspirin EC 81 MG EC tablet Take 1 tablet (81 mg total) by mouth daily.  . butalbital-acetaminophen-caffeine (FIORICET, ESGIC) 50-325-40 MG tablet Take 1-2 tablets by mouth every 6 (six) hours as needed for headache.  Marland Kitchen CALCIUM PO Take 1 tablet by mouth daily.  . Cholecalciferol (VITAMIN D PO) Take 2,000 Units by mouth 3 (three) times daily.   . cyanocobalamin 1000 MCG tablet Take 1,000 mcg by mouth daily.  . diclofenac sodium (VOLTAREN) 1 % GEL APPLY TOPICALLY TO RIBS, BACK THREE TIMES DAILY  . famotidine (PEPCID) 20 MG tablet Take 1 tablet (20 mg total) by mouth 2 (two) times daily.  . fludrocortisone (FLORINEF) 0.1 MG tablet TAKE 2 TABLETS(200 MCG) BY MOUTH DAILY  . fludrocortisone (FLORINEF) 0.1 MG tablet TAKE 2 TABLETS(200 MCG) BY MOUTH DAILY  . gabapentin (NEURONTIN) 100 MG capsule TAKE 1 CAPSULE(100 MG) BY MOUTH THREE TIMES DAILY  . glucose blood test strip Check blood sugar 1 time daily-[id number]  . multivitamin (THERAGRAN) per tablet Take 1 tablet by mouth daily.    Marland Kitchen omeprazole (PRILOSEC) 20 MG capsule TAKE 2 CAPSULES BY MOUTH EVERY DAY FOR INDIGESTION  . polyethylene glycol (MIRALAX / GLYCOLAX) packet Take 17 g by mouth daily.  . diazepam (VALIUM) 5 MG tablet Take 5 mg by mouth every 6 (six) hours as needed for anxiety.  . senna (SENOKOT) 8.6 MG TABS tablet Take 1 tablet (8.6 mg total) by mouth 2 (two) times daily.   No  facility-administered medications prior to visit.    Allergies  Allergen Reactions  . Cymbalta [Duloxetine Hcl]     Dizziness  . Keflex [Cephalexin] Other (See Comments)    Reaction: unknown   . Simvastatin Other (See Comments)    Reaction: unknown   . Sudafed [Pseudoephedrine]     Dizziness   Past Medical History:  Diagnosis Date  . 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 Colon polyps   . Coronary atherosclerosis of unspecified type of vessel, native or graft   . Difficult intubation   . Esophageal reflux   . Hypertension   . IBS (irritable bowel syndrome)   . Orthostatic hypotension    "BP has been dropping alot when I stand up for the last month or so" (02/17/2016)  . Vitamin B 12 deficiency   . Vitamin D deficiency    Review of Systems  10 point systems review negative except as above.    Objective:   Physical Exam  BP 124/80   Pulse 72   Temp (!) 97.3 F (36.3 C)   Resp 18   Ht 6' (1.829 m)   Wt 199 lb 9.6 oz (90.5 kg)   BMI 27.07 kg/m  Postural Sitting BP 146/68   &  Standing BP 124/80.  HEENT - WNL. Neck - supple.  Chest - Clear equal BS. Cor - Nl HS. RRR w/o sig MGR. PP  1(+). No edema. MS- FROM w/o deformities.  Gait Nl. Neuro -  Nl w/o focal abnormalities.    Assessment & Plan:   1. Orthostatic hypotension  - CBC with Differential/Platelet - COMPLETE METABOLIC PANEL WITH GFR - Magnesium  2. Palpitations  - COMPLETE METABOLIC PANEL WITH GFR - Magnesium - TSH - EKG 12-Lead  - verapamil (CALAN-SR) 240 MG CR tablet; Take 1 tablet daily with food  Dispense: 90 tablet; Refill: 3  3. Medication management  - CBC with Differential/Platelet - COMPLETE METABOLIC PANEL WITH GFR - Magnesium - TSH  - EKG 12-Lead -> shows NSR w/o arrhythmia.

## 2017-08-30 NOTE — Patient Instructions (Signed)
Palpitations A palpitation is the feeling that your heartbeat is irregular or is faster than normal. It may feel like your heart is fluttering or skipping a beat. Palpitations are usually not a serious problem. They may be caused by many things, including smoking, caffeine, alcohol, stress, and certain medicines. Although most causes of palpitations are not serious, palpitations can be a sign of a serious medical problem. In some cases, you may need further medical evaluation.  Follow these instructions at home:  Pay attention to any changes in your symptoms. Take these actions to help with your condition:  Avoid the following: ? Caffeinated coffee, tea, soft drinks, diet pills, and energy drinks. ? Chocolate. ? Alcohol. ?   Do not use any tobacco products, such as cigarettes, chewing tobacco, and e-cigarettes. If you need help quitting, ask your health care provider.    Try to reduce your stress and anxiety. Things that can help you relax include: ? Yoga. ? Meditation. ? Physical activity, such as swimming, jogging, or walking. ? Biofeedback. This is a method that helps you learn to use your mind to control things in your body, such as your heartbeats. ?   Get plenty of rest and sleep.  Take over-the-counter and prescription medicines only as told by your health care provider.  Keep all follow-up visits as told by your health care provider. This is important.  Contact a health care provider if:  You continue to have a fast or irregular heartbeat after 24 hours.  Your palpitations occur more often. Get help right away if:  You have chest pain or shortness of breath.  You have a severe headache.  You feel dizzy or you faint.  

## 2017-08-31 DIAGNOSIS — M9902 Segmental and somatic dysfunction of thoracic region: Secondary | ICD-10-CM | POA: Diagnosis not present

## 2017-08-31 DIAGNOSIS — M5134 Other intervertebral disc degeneration, thoracic region: Secondary | ICD-10-CM | POA: Diagnosis not present

## 2017-08-31 LAB — COMPLETE METABOLIC PANEL WITH GFR
AG Ratio: 1.6 (calc) (ref 1.0–2.5)
ALT: 10 U/L (ref 9–46)
AST: 17 U/L (ref 10–35)
Albumin: 3.9 g/dL (ref 3.6–5.1)
Alkaline phosphatase (APISO): 79 U/L (ref 40–115)
BUN/Creatinine Ratio: 14 (calc) (ref 6–22)
BUN: 18 mg/dL (ref 7–25)
CO2: 28 mmol/L (ref 20–32)
Calcium: 9.2 mg/dL (ref 8.6–10.3)
Chloride: 106 mmol/L (ref 98–110)
Creat: 1.3 mg/dL — ABNORMAL HIGH (ref 0.70–1.11)
GFR, Est African American: 59 mL/min/{1.73_m2} — ABNORMAL LOW (ref >=60)
GFR, Est Non African American: 51 mL/min/{1.73_m2} — ABNORMAL LOW (ref >=60)
Globulin: 2.5 g/dL (calc) (ref 1.9–3.7)
Glucose, Bld: 101 mg/dL — ABNORMAL HIGH (ref 65–99)
Potassium: 4.9 mmol/L (ref 3.5–5.3)
Sodium: 142 mmol/L (ref 135–146)
Total Bilirubin: 0.5 mg/dL (ref 0.2–1.2)
Total Protein: 6.4 g/dL (ref 6.1–8.1)

## 2017-08-31 LAB — MAGNESIUM: Magnesium: 2.1 mg/dL (ref 1.5–2.5)

## 2017-09-02 ENCOUNTER — Encounter: Payer: Self-pay | Admitting: Internal Medicine

## 2017-09-04 ENCOUNTER — Encounter: Payer: Medicare Other | Attending: Physical Medicine & Rehabilitation | Admitting: Physical Medicine & Rehabilitation

## 2017-09-04 ENCOUNTER — Encounter: Payer: Self-pay | Admitting: Physical Medicine & Rehabilitation

## 2017-09-04 VITALS — BP 189/89 | HR 64 | Ht 72.0 in | Wt 200.0 lb

## 2017-09-04 DIAGNOSIS — G8929 Other chronic pain: Secondary | ICD-10-CM | POA: Diagnosis not present

## 2017-09-04 DIAGNOSIS — E559 Vitamin D deficiency, unspecified: Secondary | ICD-10-CM | POA: Diagnosis not present

## 2017-09-04 DIAGNOSIS — M47816 Spondylosis without myelopathy or radiculopathy, lumbar region: Secondary | ICD-10-CM | POA: Diagnosis not present

## 2017-09-04 DIAGNOSIS — E785 Hyperlipidemia, unspecified: Secondary | ICD-10-CM | POA: Insufficient documentation

## 2017-09-04 DIAGNOSIS — Z79899 Other long term (current) drug therapy: Secondary | ICD-10-CM | POA: Diagnosis not present

## 2017-09-04 DIAGNOSIS — K589 Irritable bowel syndrome without diarrhea: Secondary | ICD-10-CM | POA: Insufficient documentation

## 2017-09-04 DIAGNOSIS — Z951 Presence of aortocoronary bypass graft: Secondary | ICD-10-CM | POA: Diagnosis not present

## 2017-09-04 DIAGNOSIS — Z955 Presence of coronary angioplasty implant and graft: Secondary | ICD-10-CM | POA: Diagnosis not present

## 2017-09-04 DIAGNOSIS — M1711 Unilateral primary osteoarthritis, right knee: Secondary | ICD-10-CM | POA: Diagnosis not present

## 2017-09-04 DIAGNOSIS — I2510 Atherosclerotic heart disease of native coronary artery without angina pectoris: Secondary | ICD-10-CM | POA: Diagnosis not present

## 2017-09-04 DIAGNOSIS — M5416 Radiculopathy, lumbar region: Secondary | ICD-10-CM

## 2017-09-04 DIAGNOSIS — M4185 Other forms of scoliosis, thoracolumbar region: Secondary | ICD-10-CM | POA: Insufficient documentation

## 2017-09-04 DIAGNOSIS — R0789 Other chest pain: Secondary | ICD-10-CM | POA: Diagnosis not present

## 2017-09-04 DIAGNOSIS — M545 Low back pain: Secondary | ICD-10-CM | POA: Insufficient documentation

## 2017-09-04 DIAGNOSIS — K219 Gastro-esophageal reflux disease without esophagitis: Secondary | ICD-10-CM | POA: Insufficient documentation

## 2017-09-04 DIAGNOSIS — Z8249 Family history of ischemic heart disease and other diseases of the circulatory system: Secondary | ICD-10-CM | POA: Insufficient documentation

## 2017-09-04 DIAGNOSIS — E1142 Type 2 diabetes mellitus with diabetic polyneuropathy: Secondary | ICD-10-CM | POA: Diagnosis not present

## 2017-09-04 DIAGNOSIS — G588 Other specified mononeuropathies: Secondary | ICD-10-CM | POA: Diagnosis not present

## 2017-09-04 DIAGNOSIS — I10 Essential (primary) hypertension: Secondary | ICD-10-CM | POA: Diagnosis not present

## 2017-09-04 DIAGNOSIS — Z87891 Personal history of nicotine dependence: Secondary | ICD-10-CM | POA: Diagnosis not present

## 2017-09-04 NOTE — Patient Instructions (Signed)
CONTINUE TO WORK ON HOME EXERCISES AND SENSIBLE ACTIVITIES  HEAT BEFORE AN ACTIVITY ICE AFTER

## 2017-09-04 NOTE — Progress Notes (Signed)
Subjective:    Patient ID: [name], male    DOB: Jan 25, 1934, 84 y.o.   MRN: [id number]  HPI  [name] is here in follow up of his chronic pain. He had some relief with the MBB's performed by Dr. Letta Pate. The last set provided about 30% relief. Pain typically radiates around his back along his mid to lower rib cage.  He still has pain into both feet but has more pain in the left foot which radiates into the top of foot and great toe.   He is also having increased right knee pain which bothers him while standing and walking. It can alo hurt when he goes up stairs.    He remains active with activities outside the house. He likes to work in his yard, cut the grass, etc.   Pain Inventory Average Pain 4 Pain Right Now 4 My pain is aching  In the last 24 hours, has pain interfered with the following? General activity 10 Relation with others 2 Enjoyment of life 10 What TIME of day is your pain at its worst? morning Sleep (in general) Fair  Pain is worse with: walking, standing and some activites Pain improves with: rest Relief from Meds: n/a  Mobility walk with assistance use a cane how many minutes can you walk? 5 ability to climb steps?  no do you drive?  yes  Function retired  Neuro/Psych numbness trouble walking dizziness  Prior Studies Any changes since last visit?  no  Physicians involved in your care Any changes since last visit?  no   Family History  Problem Relation Age of Onset  . Heart attack Father        died age 30  . Heart attack Brother        died age 85  . Heart attack Sister        died age 40  . Colon cancer Sister   . Liver cancer Sister   . Diabetes Maternal Grandmother   . Colon polyps Sister        and brothers x 2    Social History   Socioeconomic History  . Marital status: Married    Spouse name: Not on file  . Number of children: 4   . Years of education: Not on file  . Highest education level: Not on file    Occupational History  . Occupation: Retired  Scientific laboratory technician  . Financial resource strain: Not on file  . Food insecurity:    Worry: Not on file    Inability: Not on file  . Transportation needs:    Medical: Not on file    Non-medical: Not on file  Tobacco Use  . Smoking status: Former Smoker    Last attempt to quit: 07/16/1963    Years since quitting: 54.1  . Smokeless tobacco: Never Used  Substance and Sexual Activity  . Alcohol use: No  . Drug use: No  . Sexual activity: Not Currently  Lifestyle  . Physical activity:    Days per week: Not on file    Minutes per session: Not on file  . Stress: Not on file  Relationships  . Social connections:    Talks on phone: Not on file    Gets together: Not on file    Attends religious service: Not on file    Active member of club or organization: Not on file    Attends meetings of clubs or organizations: Not on file  Relationship status: Not on file  Other Topics Concern  . Not on file  Social History Narrative   1 caffeine drink daily    Past Surgical History:  Procedure Laterality Date  . BLADDER SURGERY  1969   traumatic pelvic fractures, urethral and bladder repair  . CARDIAC CATHETERIZATION N/A 07/01/2015   Procedure: Left Heart Cath and Coronary Angiography;  Surgeon: Wellington Hampshire, MD;  Location: [facility];  Service: Cardiovascular;  Laterality: N/A;  . COLON RESECTION N/A 05/17/2017   Procedure: DIAGNOSTIC LAPAROSCOPY,;  Surgeon: Leighton Ruff, MD;  Location: [facility];  Service: General;  Laterality: N/A;  . CORONARY ANGIOPLASTY WITH STENT PLACEMENT    . CORONARY ARTERY BYPASS GRAFT N/A 07/06/2015   Procedure: CORONARY ARTERY BYPASS GRAFTING (CABG)x 4   utilizing the left internal mammary artery and endoscopically harvested bilateral  sapheneous vein.;  Surgeon: Ivin Poot, MD;  Location: [facility];  Service: Open Heart Surgery;  Laterality: N/A;  . KNEE SURGERY    . TEE WITHOUT CARDIOVERSION N/A 07/06/2015    Procedure: TRANSESOPHAGEAL ECHOCARDIOGRAM (TEE);  Surgeon: Ivin Poot, MD;  Location: [facility];  Service: Open Heart Surgery;  Laterality: N/A;   Past Medical History:  Diagnosis Date  . 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 Colon polyps   . Coronary atherosclerosis of unspecified type of vessel, native or graft   . Difficult intubation   . Esophageal reflux   . Hypertension   . IBS (irritable bowel syndrome)   . Orthostatic hypotension    "BP has been dropping alot when I stand up for the last month or so" (02/17/2016)  . Vitamin B 12 deficiency   . Vitamin D deficiency    BP (!) 189/89 (BP Location: Left Arm, Patient Position: Sitting, Cuff Size: Normal)   Pulse 64   Ht 6' (1.829 m)   Wt 200 lb (90.7 kg)   SpO2 96%   BMI 27.12 kg/m   Opioid Risk Score:   Fall Risk Score:  `1  Depression screen PHQ 2/9  Depression screen [facility] 2/9 09/04/2017 04/12/2017 04/04/2017 11/29/2016 03/22/2016 02/09/2016 10/29/2015  Decreased Interest 0 0 0 0 3 0 0  Down, Depressed, Hopeless 0 0 0 0 1 0 0  PHQ - 2 Score 0 0 0 0 4 0 0  Altered sleeping - - - - 2 - -  Tired, decreased energy - - - - 3 - -  Change in appetite - - - - 1 - -  Feeling bad or failure about yourself  - - - - 2 - -  Trouble concentrating - - - - 2 - -  Moving slowly or fidgety/restless - - - - 1 - -  Suicidal thoughts - - - - 0 - -  PHQ-9 Score - - - - 15 - -  Difficult doing work/chores - - - - Extremely dIfficult - -    Review of Systems  Constitutional: Positive for diaphoresis.  HENT: Negative.   Eyes: Negative.   Respiratory: Negative.   Gastrointestinal: Negative.   Endocrine: Negative.   Genitourinary: Negative.   Musculoskeletal: Positive for arthralgias, back pain and gait problem.  Skin: Negative.   Allergic/Immunologic: Negative.        Objective:   Physical Exam General: No acute distress HEENT: EOMI, oral membranes moist Cards: reg rate  Chest: normal effort Abdomen: Soft, NT, ND Skin: dry,  intact Extremities: no edema Skin:Clean and intact without signs of breakdown. CABG incision  along sternum clean/intact Neuro:Pt is cognitively appropriate with normal insight, memory, and awareness. Cranial nerves 2-12 are intact. Decreased sensation to LT bilateral lower extremities below the calves.Reflexes are 2+ in all 4's. Fine motor coordination is intact. No tremors. Motor function remains 5/5.  Musculoskeletal:LUMBAR levoscoliosis. Pain with extension and flexion of lumbar spine. mid to lower spine TTP. Rib cage tender. Right knee with medial joint line pain. McMurray's positive for medial meniscus pain. Mild antalgia to left.  Psych:pleasantand appropriate        1. Chronic right chest wall pain, intercostal neuritis, ribs 6 and 7. Likely triggered by recent CABG in March. Pain is recurrent and pt also having tenderness on the left at 8-9again---has responded partially to nerve blocks. 2. Chronic low back pain, thoracolumbar levoscoliosis.  Most recent lumbar MRI reveals critical DDD at L3-L5 and foraminal stenosis at these levels. Good results with recent right L4-5 T-LESI.  Had equivocal results with medial branch blocks in the past.  Presentation at this point is more consistent with a facet arthropathy however. 3. Diabetic polyneuropathy with ?autonomic dysfunction, hypotension 4. Right knee pain. Likely old meniscal injury/osteoarthritis--increased pain today   Plan: 1. Pt had reasonable results with MBB's to lumbar spine. Consider RF's vs repeat MBB's in future. He had about 30% relief with the most recent MBB's which doesn't sway me to pursue them at this point. He has numerous sources of pain in his lumbar spine as well.  2.  Maintain gabapentin 200mg twice daily.    3. Voltaren gel and lidocaine gel to back/ribs.  4. Stretching and home exercise program were reviewed once again. He needs to remain active but sensible about what he's doing 5.After  informed consent and preparation of the skin with betadine and isopropyl alcohol, I injected 6mg  (1cc) of celestone and 4cc of 1% lidocaine into the medial knee via medial approach. Additionally, aspiration was performed prior to injection. The patient tolerated well, and no complications were encountered. Afterward the area was cleaned and dressed. Post- injection instructions were provided.   6. Follow up in about 3 months. 25 minutes of face to face patient care time were spent during this visit. All questions were encouraged and answered.

## 2017-09-05 ENCOUNTER — Telehealth: Payer: Self-pay | Admitting: *Deleted

## 2017-09-05 NOTE — Telephone Encounter (Signed)
Patient called and reported his BP is very low since starting the Verapamil and he complained of swelling and feeling achy. He is taking Florinef 2 tablets daily and has stopped the Verapamil.  Per Dr Melford Aase, stay off the Verapamil, but continue the Florinef.  A message was left to inform the patient.

## 2017-09-14 IMAGING — CT CT HEAD W/O CM
4 series · 16 of 47 positions shown, 18 images · non-contrast
Comparison: None.

CLINICAL DATA: Orthostatics hypotension with fall yesterday.

EXAM:
CT HEAD WITHOUT CONTRAST
TECHNIQUE: Contiguous axial images were obtained from the base of the skull
through the vertex without intravenous contrast.

[Series 2: head without · axial · non-contrast · 0.44mm/px · z∈[+1423,+1543]mm · 7 of 34 slices shown, 9 images]
[im 5/34  brain]
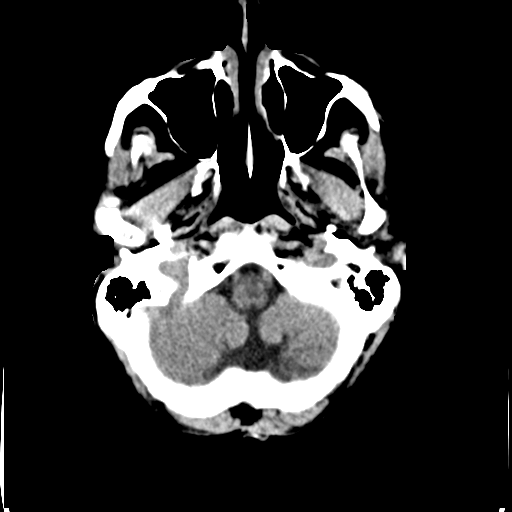
[im 5/34  bone]
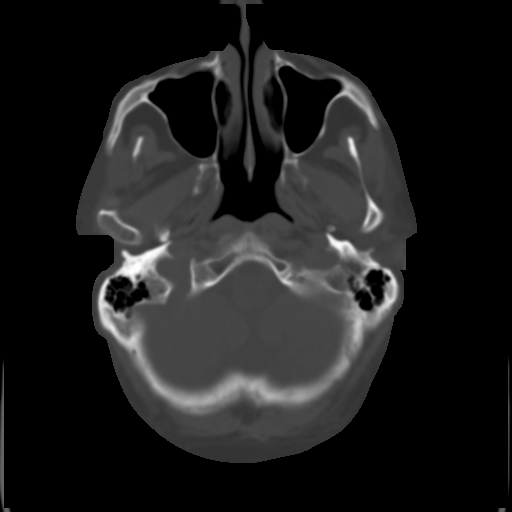
[im 9/34  brain]
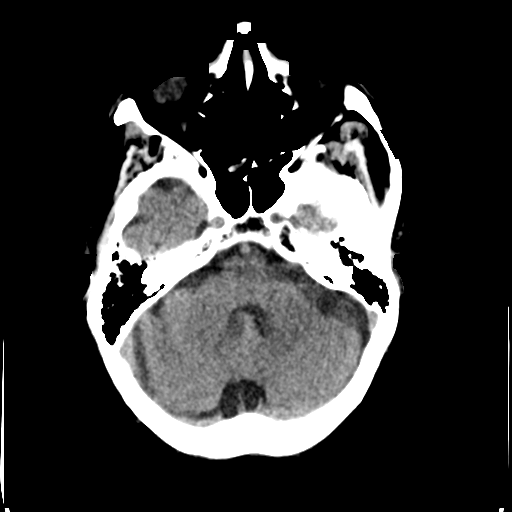
[im 13/34  brain]
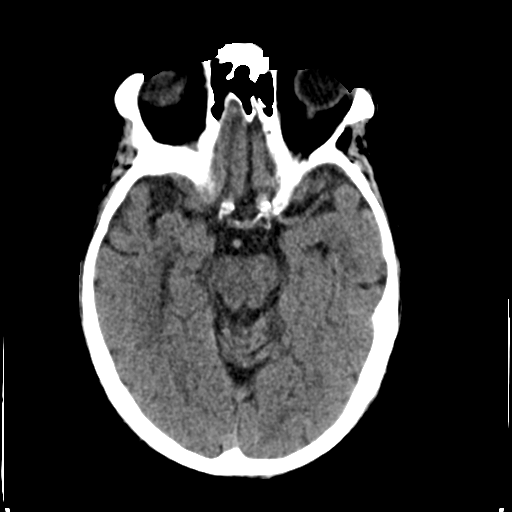
[im 17/34  brain]
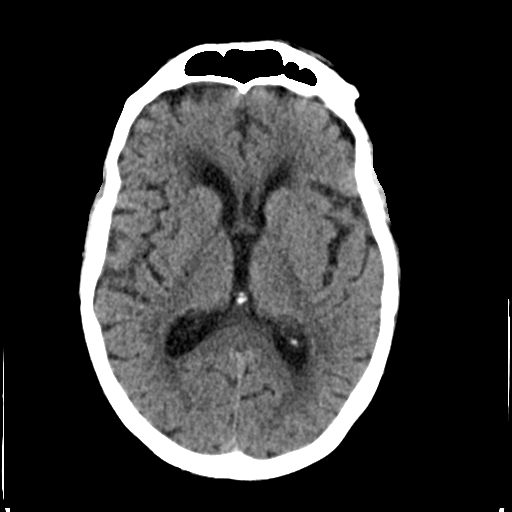
[im 21/34  brain]
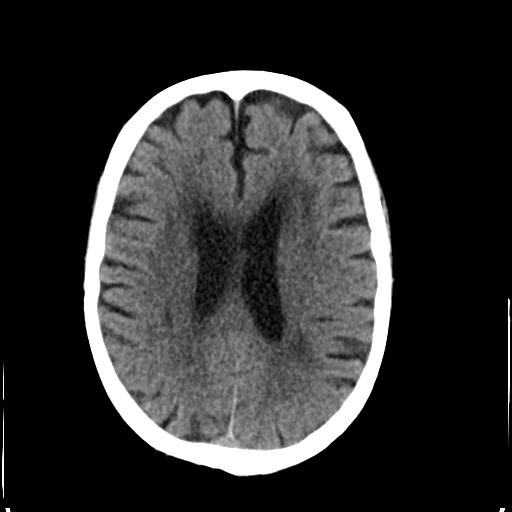
[im 21/34  bone]
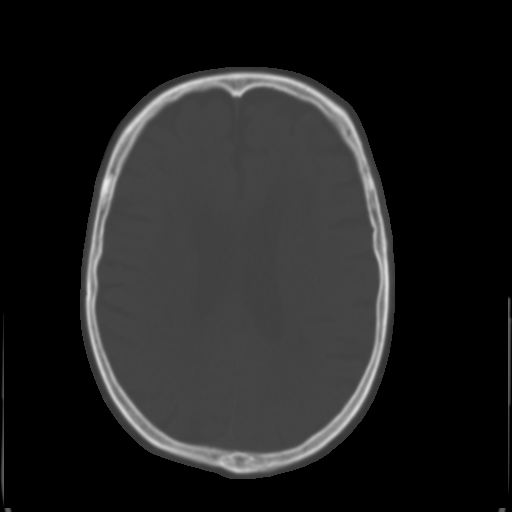
[im 25/34  brain]
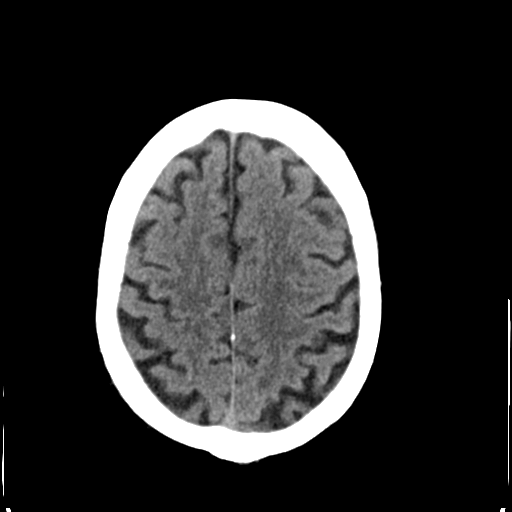
[im 29/34  brain]
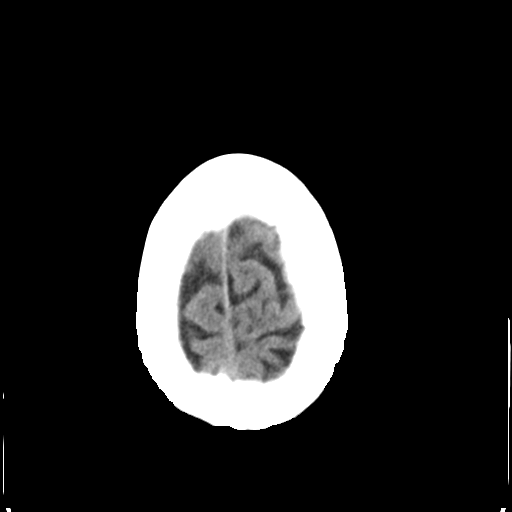

[Series 3: head bone · axial · 0.44mm/px · z∈[+1419,+1451]mm · 3 of 83 slices shown]
[im 9/83  bone]
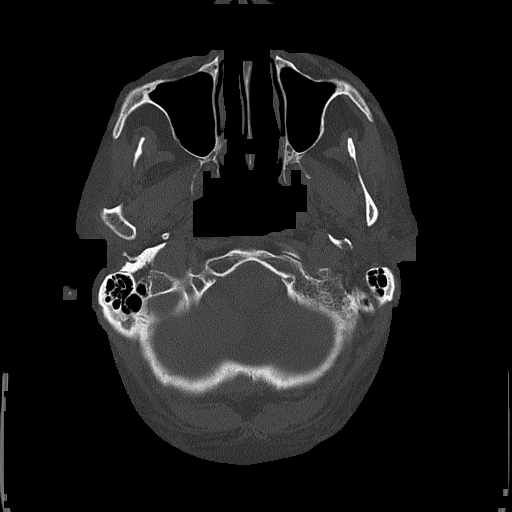
[im 17/83  bone]
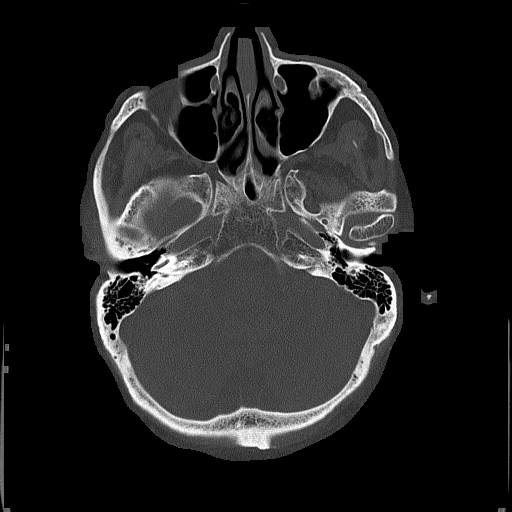
[im 25/83  bone]
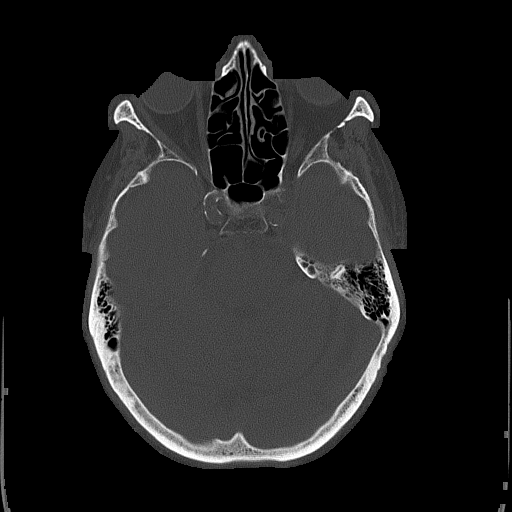

[Series 4: head without cor · coronal · non-contrast · 0.32mm/px · 3 of 71 slices shown]
[im 24/71  brain]
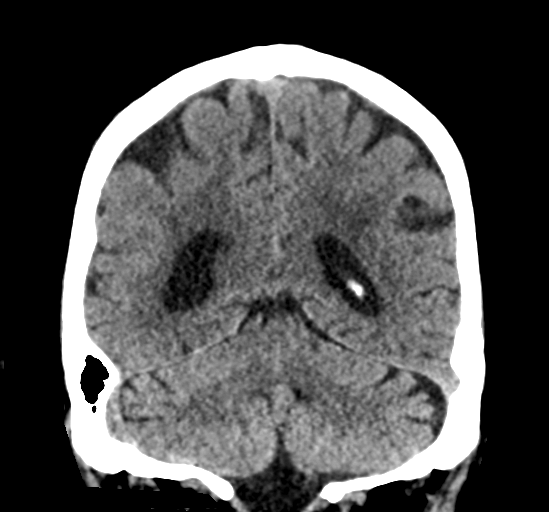
[im 32/71  brain]
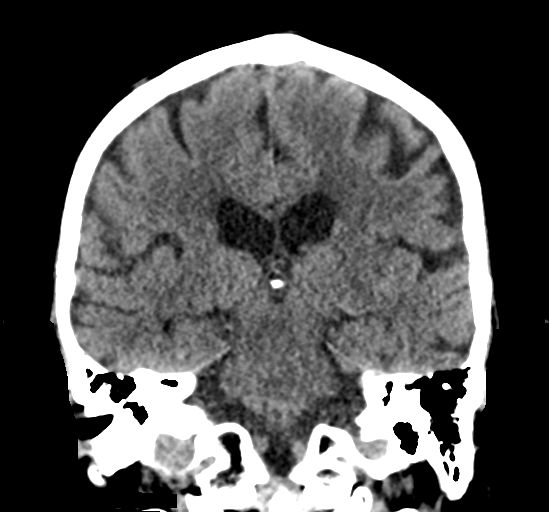
[im 39/71  brain]
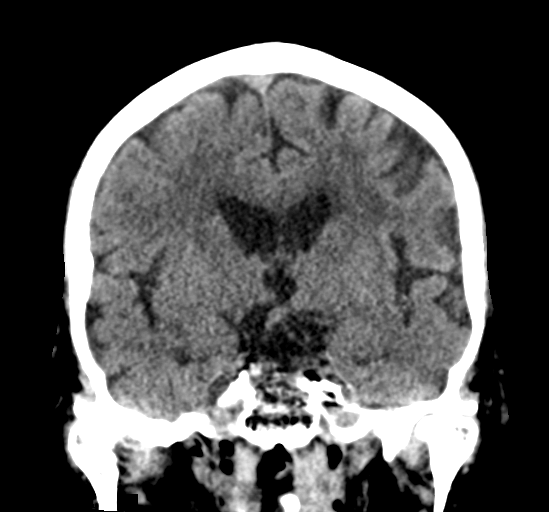

[Series 5: head without sag · sagittal · non-contrast · 0.32mm/px · 3 of 56 slices shown]
[im 19/56  brain]
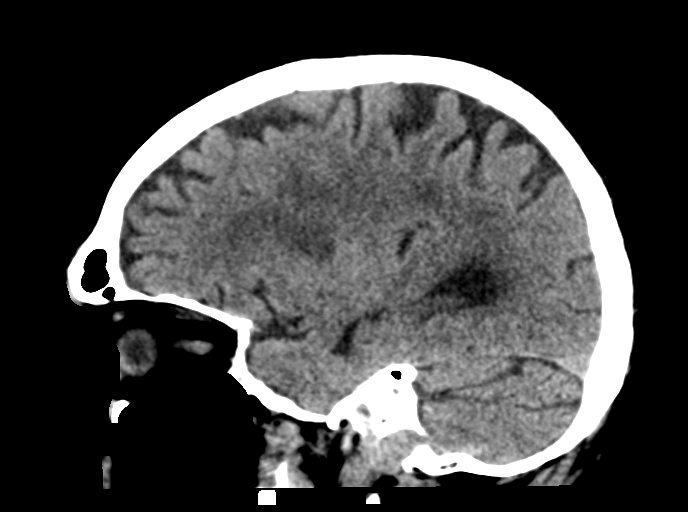
[im 28/56  brain]
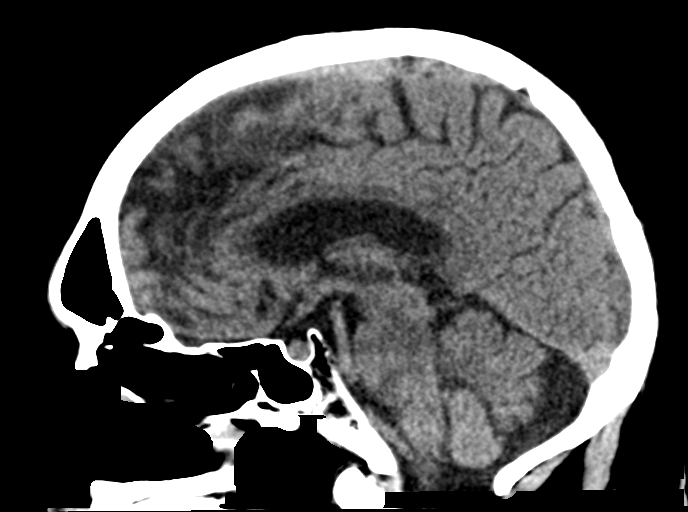
[im 37/56  brain]
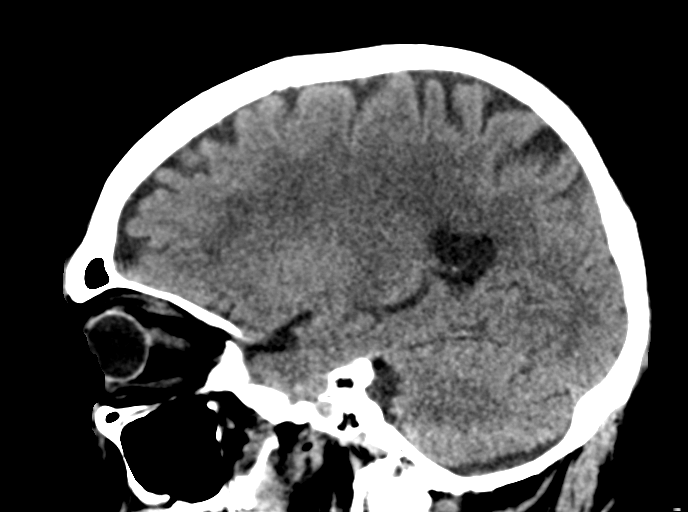

[16 of 47 positions shown; findings below may reference images not displayed]

FINDINGS: Brain: The brain shows age related atrophy. No abnormality is seen
affecting the brainstem or cerebellum. Within the cerebral
hemispheres, there are chronic changes of small vessel disease
throughout the white matter. There are old lacunar infarctions in
the right basal ganglia. No cortical or large vessel territory
infarction. No mass lesion, hemorrhage, hydrocephalus or extra-axial
collection.

Vascular: There is atherosclerotic calcification of the major
vessels at the base of the brain.

Skull: No skull fracture or focal lesion.

Sinuses/Orbits: Clear/normal

Other: None significant
IMPRESSION: No acute or traumatic finding. Chronic small-vessel ischemic changes
affecting the cerebral hemispheric white matter. Old small vessel
infarctions right basal ganglia. Atherosclerotic calcification of
the major vessels at the base of the brain.

## 2017-09-21 DIAGNOSIS — M9902 Segmental and somatic dysfunction of thoracic region: Secondary | ICD-10-CM | POA: Diagnosis not present

## 2017-09-21 DIAGNOSIS — M5134 Other intervertebral disc degeneration, thoracic region: Secondary | ICD-10-CM | POA: Diagnosis not present

## 2017-09-26 DIAGNOSIS — L819 Disorder of pigmentation, unspecified: Secondary | ICD-10-CM | POA: Diagnosis not present

## 2017-09-26 DIAGNOSIS — D485 Neoplasm of uncertain behavior of skin: Secondary | ICD-10-CM | POA: Diagnosis not present

## 2017-09-26 DIAGNOSIS — C44311 Basal cell carcinoma of skin of nose: Secondary | ICD-10-CM | POA: Diagnosis not present

## 2017-09-26 DIAGNOSIS — C4441 Basal cell carcinoma of skin of scalp and neck: Secondary | ICD-10-CM | POA: Diagnosis not present

## 2017-09-27 ENCOUNTER — Other Ambulatory Visit: Payer: Self-pay | Admitting: Physical Medicine & Rehabilitation

## 2017-09-27 ENCOUNTER — Other Ambulatory Visit: Payer: Self-pay | Admitting: Internal Medicine

## 2017-10-06 ENCOUNTER — Other Ambulatory Visit: Payer: Self-pay | Admitting: *Deleted

## 2017-10-06 MED ORDER — GABAPENTIN 100 MG PO CAPS: ORAL_CAPSULE | ORAL | 3 refills | Status: DC

## 2017-10-11 DIAGNOSIS — M9902 Segmental and somatic dysfunction of thoracic region: Secondary | ICD-10-CM | POA: Diagnosis not present

## 2017-10-11 DIAGNOSIS — M5134 Other intervertebral disc degeneration, thoracic region: Secondary | ICD-10-CM | POA: Diagnosis not present

## 2017-10-20 ENCOUNTER — Other Ambulatory Visit: Payer: Self-pay | Admitting: Physician Assistant

## 2017-10-24 NOTE — Progress Notes (Signed)
FOLLOW UP  Assessment and Plan:   BP Well controlled with current medications; continue verapamil, florinef Monitor blood pressure at home; patient to call if consistently greater than 130/80 Continue DASH diet.   Reminder to go to the ER if any CP, SOB, nausea, dizziness, severe HA, changes vision/speech, left arm numbness and tingling and jaw pain.  Cholesterol Currently above goal; mild elevations not treated secondary to age, continue to monitor Continue low cholesterol diet and exercise.  Check lipid panel.   Diabetes with diabetic chronic kidney disease, with diabetic peripheral angiopathy without gangrene and with diabetic polyneuropathy Managed by lifestyle Continue diet and exercise.  Perform daily foot/skin check, notify office of any concerning changes.  Check A1C  Overweight Long discussion about weight loss, diet, and exercise Recommended diet heavy in fruits and veggies and low in animal meats, cheeses, and dairy products, appropriate calorie intake Discussed ideal weight for height  Will follow up in 3 months  Vitamin D Def At goal at last visit; continue supplementation to maintain goal of 70-100 Defer Vit D level  Continue diet and meds as discussed. Further disposition pending results of labs. Discussed med's effects and SE's.   Over 30 minutes of exam, counseling, chart review, and critical decision making was performed.   Future Appointments  Date Time Provider [place]  12/18/2017  9:20 AM Meredith Staggers, MD [facility]  01/18/2018 11:00 AM [name], MD [facility] None    ----------------------------------------------------------------------------------------------------------------------  HPI 84 y.o. male  presents for 3 month follow up on hypertension, cholesterol, diabetes, weight and vitamin D deficiency. He has developed dysautonomia with supine HTN and severe postural Hypotension managed with Florinef which his wife titrates  to maintain standing BP. 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 He continues to follow with Dr. Tessa Lerner for pain management.  BMI is Body mass index is 27.26 kg/m., he has been working on diet, and does stretching and strength exercises daily every morning.  Wt Readings from Last 3 Encounters:  10/26/17 201 lb (91.2 kg)  09/04/17 200 lb (90.7 kg)  08/30/17 199 lb 9.6 oz (90.5 kg)   His blood pressure has been controlled at home, today their BP is BP: 122/66  He does workout. He denies chest pain, shortness of breath, dizziness.   He is not on cholesterol medication secondary to age. His cholesterol is not at goal. The cholesterol last visit was:   Lab Results  Component Value Date   CHOL 203 (H) 07/06/2017   HDL 34 (L) 07/06/2017   LDLCALC 133 (H) 07/06/2017   TRIG 217 (H) 07/06/2017   CHOLHDL 6.0 (H) 07/06/2017    He has been working on diet and exercise for diet controlled T2 diabetes, and denies foot ulcerations, increased appetite, nausea, polydipsia, polyuria, visual disturbances, vomiting and weight loss. He does check a sporadic fasting glucose, reports ranges 100-130. Last A1C in the office was:  Lab Results  Component Value Date   HGBA1C 6.0 (H) 07/06/2017   Patient is on Vitamin D supplement.   Lab Results  Component Value Date   VD25OH 11 07/06/2017        Current Medications:  Current Outpatient Medications on File Prior to Visit  Medication Sig  . acetaminophen (TYLENOL) 325 MG tablet Take 2 tablets (650 mg total) by mouth every 6 (six) hours as needed for mild pain or headache (or Fever >/= 101).  Marland Kitchen aspirin EC 81 MG EC tablet Take  1 tablet (81 mg total) by mouth daily.  . butalbital-acetaminophen-caffeine (FIORICET, ESGIC) 50-325-40 MG tablet Take 1-2 tablets by mouth every 6 (six) hours as needed for headache.  Marland Kitchen CALCIUM PO Take 1 tablet by mouth daily.  . Cholecalciferol (VITAMIN D PO) Take 2,000 Units by mouth 3 (three) times  daily.   . cyanocobalamin 1000 MCG tablet Take 1,000 mcg by mouth daily.  . diclofenac sodium (VOLTAREN) 1 % GEL APPLY TOPICALLY TO RIBS, BACK THREE TIMES DAILY  . famotidine (PEPCID) 20 MG tablet Take 1 tablet (20 mg total) by mouth 2 (two) times daily.  . fludrocortisone (FLORINEF) 0.1 MG tablet TAKE 2 TABLETS(200 MCG) BY MOUTH DAILY  . gabapentin (NEURONTIN) 100 MG capsule TAKE 2 CAPSULE(200 MG) BY MOUTH 2 TIMES A DAY.  Marland Kitchen glucose blood test strip Check blood sugar 1 time daily-[id number]  . multivitamin (THERAGRAN) per tablet Take 1 tablet by mouth daily.    Marland Kitchen omeprazole (PRILOSEC) 20 MG capsule TAKE 2 CAPSULES BY MOUTH EVERY DAY FOR INDIGESTION  . polyethylene glycol (MIRALAX / GLYCOLAX) packet Take 17 g by mouth daily.  . verapamil (CALAN-SR) 240 MG CR tablet Take 1 tablet daily with food   No current facility-administered medications on file prior to visit.      Allergies:  Allergies  Allergen Reactions  . Cymbalta [Duloxetine Hcl]     Dizziness  . Keflex [Cephalexin] Other (See Comments)    Reaction: unknown   . Simvastatin Other (See Comments)    Reaction: unknown   . Sudafed [Pseudoephedrine]     Dizziness     Medical History:  Past Medical History:  Diagnosis Date  . 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 Colon polyps   . Coronary atherosclerosis of unspecified type of vessel, native or graft   . Difficult intubation   . Esophageal reflux   . Hypertension   . IBS (irritable bowel syndrome)   . Orthostatic hypotension    "BP has been dropping alot when I stand up for the last month or so" (02/17/2016)  . Vitamin B 12 deficiency   . Vitamin D deficiency    Family history- Reviewed and unchanged Social history- Reviewed and unchanged   Review of Systems:  Review of Systems  Constitutional: Negative for malaise/fatigue and weight loss.  HENT: Negative for hearing loss and tinnitus.   Eyes: Negative for blurred vision and double vision.  Respiratory: Negative for  cough, shortness of breath and wheezing.   Cardiovascular: Negative for chest pain, palpitations, orthopnea, claudication and leg swelling.  Gastrointestinal: Negative for abdominal pain, blood in stool, constipation, diarrhea, heartburn, melena, nausea and vomiting.  Genitourinary: Negative.   Musculoskeletal: Positive for back pain and joint pain. Negative for myalgias.  Skin: Negative for rash.  Neurological: Positive for dizziness (Chronic constant) and tingling. Negative for sensory change, weakness and headaches.  Endo/Heme/Allergies: Negative for polydipsia.  Psychiatric/Behavioral: Negative.   All other systems reviewed and are negative.    Physical Exam: BP 122/66   Pulse 64   Temp (!) 97.5 F (36.4 C)   Resp 16   Ht 6' (1.829 m)   Wt 201 lb (91.2 kg)   SpO2 97%   BMI 27.26 kg/m  Wt Readings from Last 3 Encounters:  10/26/17 201 lb (91.2 kg)  09/04/17 200 lb (90.7 kg)  08/30/17 199 lb 9.6 oz (90.5 kg)   General Appearance: Well nourished, in no apparent distress. Eyes: PERRLA, EOMs, conjunctiva no swelling or erythema Sinuses: No Frontal/maxillary  tenderness ENT/Mouth: Ext aud canals clear, TMs without erythema, bulging. No erythema, swelling, or exudate on post pharynx.  Tonsils not swollen or erythematous. Hearing normal.  Neck: Supple, thyroid normal.  Respiratory: Respiratory effort normal, BS equal bilaterally without rales, rhonchi, wheezing or stridor.  Cardio: RRR with no MRGs. Brisk peripheral pulses without edema.  Abdomen: Soft, + BS.  Non tender, no guarding, rebound, hernias, masses. Lymphatics: Non tender without lymphadenopathy.  Musculoskeletal: Full ROM, symmetrical strength, slow gait with cane Skin: Warm, dry without rashes, lesions, ecchymosis.  Neuro: Cranial nerves intact. No cerebellar symptoms.  Psych: Awake and oriented X 3, normal affect, Insight and Judgment appropriate.    Izora Ribas, NP 10:34 AM [facility]

## 2017-10-26 ENCOUNTER — Ambulatory Visit: Payer: Medicare Other | Admitting: Adult Health

## 2017-10-26 ENCOUNTER — Encounter: Payer: Self-pay | Admitting: Adult Health

## 2017-10-26 VITALS — BP 122/66 | HR 64 | Temp 97.5°F | Resp 16 | Ht 72.0 in | Wt 201.0 lb

## 2017-10-26 DIAGNOSIS — I951 Orthostatic hypotension: Secondary | ICD-10-CM

## 2017-10-26 DIAGNOSIS — G903 Multi-system degeneration of the autonomic nervous system: Secondary | ICD-10-CM | POA: Diagnosis not present

## 2017-10-26 DIAGNOSIS — N183 Chronic kidney disease, stage 3 unspecified: Secondary | ICD-10-CM

## 2017-10-26 DIAGNOSIS — Z6827 Body mass index (BMI) 27.0-27.9, adult: Secondary | ICD-10-CM | POA: Diagnosis not present

## 2017-10-26 DIAGNOSIS — E559 Vitamin D deficiency, unspecified: Secondary | ICD-10-CM | POA: Diagnosis not present

## 2017-10-26 DIAGNOSIS — E119 Type 2 diabetes mellitus without complications: Secondary | ICD-10-CM

## 2017-10-26 DIAGNOSIS — K219 Gastro-esophageal reflux disease without esophagitis: Secondary | ICD-10-CM

## 2017-10-26 DIAGNOSIS — Z79899 Other long term (current) drug therapy: Secondary | ICD-10-CM

## 2017-10-26 DIAGNOSIS — E782 Mixed hyperlipidemia: Secondary | ICD-10-CM

## 2017-10-26 DIAGNOSIS — I10 Essential (primary) hypertension: Secondary | ICD-10-CM | POA: Diagnosis not present

## 2017-10-26 NOTE — Patient Instructions (Signed)
Please follow up with your eye doctor and heart doctor  Make sure you are drinking enough fluids every day   Dizziness Dizziness is a common problem. It is a feeling of unsteadiness or light-headedness. You may feel like you are about to faint. Dizziness can lead to injury if you stumble or fall. Anyone can become dizzy, but dizziness is more common in older adults. This condition can be caused by a number of things, including medicines, dehydration, or illness. Follow these instructions at home: Eating and drinking  Drink enough fluid to keep your urine clear or pale yellow. This helps to keep you from becoming dehydrated. Try to drink more clear fluids, such as water.  Do not drink alcohol.  Limit your caffeine intake if told to do so by your health care provider. Check ingredients and nutrition facts to see if a food or beverage contains caffeine.  Limit your salt (sodium) intake if told to do so by your health care provider. Check ingredients and nutrition facts to see if a food or beverage contains sodium. Activity  Avoid making quick movements. ? Rise slowly from chairs and steady yourself until you feel okay. ? In the morning, first sit up on the side of the bed. When you feel okay, stand slowly while you hold onto something until you know that your balance is fine.  If you need to stand in one place for a long time, move your legs often. Tighten and relax the muscles in your legs while you are standing.  Do not drive or use heavy machinery if you feel dizzy.  Avoid bending down if you feel dizzy. Place items in your home so that they are easy for you to reach without leaning over. Lifestyle  Do not use any products that contain nicotine or tobacco, such as cigarettes and e-cigarettes. If you need help quitting, ask your health care provider.  Try to reduce your stress level by using methods such as yoga or meditation. Talk with your health care provider if you need help to  manage your stress. General instructions  Watch your dizziness for any changes.  Take over-the-counter and prescription medicines only as told by your health care provider. Talk with your health care provider if you think that your dizziness is caused by a medicine that you are taking.  Tell a friend or a family member that you are feeling dizzy. If he or she notices any changes in your behavior, have this person call your health care provider.  Keep all follow-up visits as told by your health care provider. This is important. Contact a health care provider if:  Your dizziness does not go away.  Your dizziness or light-headedness gets worse.  You feel nauseous.  You have reduced hearing.  You have new symptoms.  You are unsteady on your feet or you feel like the room is spinning. Get help right away if:  You vomit or have diarrhea and are unable to eat or drink anything.  You have problems talking, walking, swallowing, or using your arms, hands, or legs.  You feel generally weak.  You are not thinking clearly or you have trouble forming sentences. It may take a friend or family member to notice this.  You have chest pain, abdominal pain, shortness of breath, or sweating.  Your vision changes.  You have any bleeding.  You have a severe headache.  You have neck pain or a stiff neck.  You have a fever. These symptoms may  represent a serious problem that is an emergency. Do not wait to see if the symptoms will go away. Get medical help right away. Call your local emergency services (911 in the [place] Do not drive yourself to the hospital. Summary  Dizziness is a feeling of unsteadiness or light-headedness. This condition can be caused by a number of things, including medicines, dehydration, or illness.  Anyone can become dizzy, but dizziness is more common in older adults.  Drink enough fluid to keep your urine clear or pale yellow. Do not drink alcohol.  Avoid making  quick movements if you feel dizzy. Monitor your dizziness for any changes. This information is not intended to replace advice given to you by your health care provider. Make sure you discuss any questions you have with your health care provider. Document Released: 10/12/2000 Document Revised: 05/21/2016 Document Reviewed: 05/21/2016 Elsevier Interactive Patient Education  [employer].

## 2017-10-27 LAB — COMPLETE METABOLIC PANEL WITH GFR
AG Ratio: 1.5 (calc) (ref 1.0–2.5)
ALT: 8 U/L — ABNORMAL LOW (ref 9–46)
AST: 17 U/L (ref 10–35)
Albumin: 4 g/dL (ref 3.6–5.1)
Alkaline phosphatase (APISO): 72 U/L (ref 40–115)
BUN/Creatinine Ratio: 16 (calc) (ref 6–22)
BUN: 19 mg/dL (ref 7–25)
CO2: 32 mmol/L (ref 20–32)
Calcium: 9.2 mg/dL (ref 8.6–10.3)
Chloride: 103 mmol/L (ref 98–110)
Creat: 1.18 mg/dL — ABNORMAL HIGH (ref 0.70–1.11)
GFR, Est African American: 66 mL/min/{1.73_m2} (ref >=60)
GFR, Est Non African American: 57 mL/min/{1.73_m2} — ABNORMAL LOW (ref >=60)
Globulin: 2.6 g/dL (calc) (ref 1.9–3.7)
Glucose, Bld: 117 mg/dL — ABNORMAL HIGH (ref 65–99)
Potassium: 4.5 mmol/L (ref 3.5–5.3)
Sodium: 140 mmol/L (ref 135–146)
Total Bilirubin: 0.6 mg/dL (ref 0.2–1.2)
Total Protein: 6.6 g/dL (ref 6.1–8.1)

## 2017-10-27 LAB — CBC WITH DIFFERENTIAL/PLATELET
Basophils Absolute: 47 cells/uL (ref 0–200)
Basophils Relative: 0.6 %
Eosinophils Absolute: 211 cells/uL (ref 15–500)
Eosinophils Relative: 2.7 %
HCT: 39.7 % (ref 38.5–50.0)
Hemoglobin: 13 g/dL — ABNORMAL LOW (ref 13.2–17.1)
Lymphs Abs: 2122 cells/uL (ref 850–3900)
MCH: 30.4 pg (ref 27.0–33.0)
MCHC: 32.7 g/dL (ref 32.0–36.0)
MCV: 93 fL (ref 80.0–100.0)
MPV: 9.8 fL (ref 7.5–12.5)
Monocytes Relative: 7.1 %
Neutro Abs: 4867 cells/uL (ref 1500–7800)
Neutrophils Relative %: 62.4 %
Platelets: 235 10*3/uL (ref 140–400)
RBC: 4.27 10*6/uL (ref 4.20–5.80)
RDW: 13.7 % (ref 11.0–15.0)
Total Lymphocyte: 27.2 %
WBC mixed population: 554 cells/uL (ref 200–950)
WBC: 7.8 10*3/uL (ref 3.8–10.8)

## 2017-10-27 LAB — HEMOGLOBIN A1C
Hgb A1c MFr Bld: 5.8 % of total Hgb — ABNORMAL HIGH (ref <5.7)
Mean Plasma Glucose: 120 (calc)
eAG (mmol/L): 6.6 (calc)

## 2017-10-27 LAB — MAGNESIUM: Magnesium: 2.1 mg/dL (ref 1.5–2.5)

## 2017-10-27 LAB — LIPID PANEL
Cholesterol: 180 mg/dL (ref <200)
HDL: 29 mg/dL — ABNORMAL LOW (ref >40)
LDL Cholesterol (Calc): 122 mg/dL (calc) — ABNORMAL HIGH
Non-HDL Cholesterol (Calc): 151 mg/dL (calc) — ABNORMAL HIGH (ref <130)
Total CHOL/HDL Ratio: 6.2 (calc) — ABNORMAL HIGH (ref <5.0)
Triglycerides: 177 mg/dL — ABNORMAL HIGH (ref <150)

## 2017-10-27 LAB — TSH: TSH: 2.19 mIU/L (ref 0.40–4.50)

## 2017-11-01 DIAGNOSIS — M9902 Segmental and somatic dysfunction of thoracic region: Secondary | ICD-10-CM | POA: Diagnosis not present

## 2017-11-01 DIAGNOSIS — M5134 Other intervertebral disc degeneration, thoracic region: Secondary | ICD-10-CM | POA: Diagnosis not present

## 2017-11-21 ENCOUNTER — Other Ambulatory Visit: Payer: Self-pay | Admitting: Internal Medicine

## 2017-11-22 DIAGNOSIS — M9902 Segmental and somatic dysfunction of thoracic region: Secondary | ICD-10-CM | POA: Diagnosis not present

## 2017-11-22 DIAGNOSIS — M5134 Other intervertebral disc degeneration, thoracic region: Secondary | ICD-10-CM | POA: Diagnosis not present

## 2017-11-27 ENCOUNTER — Telehealth: Payer: Self-pay | Admitting: *Deleted

## 2017-11-27 MED ORDER — MECLIZINE HCL 25 MG PO TABS: 25.0000 mg | ORAL_TABLET | Freq: Three times a day (TID) | ORAL | 0 refills | Status: DC | PRN

## 2017-11-27 NOTE — Telephone Encounter (Signed)
Patient called and complained of a spinning feeling when he gets up at night to go to the bathroom.  He states he has difficulty walking.  His BP has been good and is taking Florinef daily.  Per Dr Melford Aase, and RX for Meclizine 25 mg 1 3 times a day PRN, has been sent to his pharmacy.  The patient is aware.

## 2017-11-28 DIAGNOSIS — M5134 Other intervertebral disc degeneration, thoracic region: Secondary | ICD-10-CM | POA: Diagnosis not present

## 2017-11-28 DIAGNOSIS — M9902 Segmental and somatic dysfunction of thoracic region: Secondary | ICD-10-CM | POA: Diagnosis not present

## 2017-12-12 ENCOUNTER — Encounter: Payer: Self-pay | Admitting: Internal Medicine

## 2017-12-14 DIAGNOSIS — M9902 Segmental and somatic dysfunction of thoracic region: Secondary | ICD-10-CM | POA: Diagnosis not present

## 2017-12-14 DIAGNOSIS — M5134 Other intervertebral disc degeneration, thoracic region: Secondary | ICD-10-CM | POA: Diagnosis not present

## 2017-12-18 ENCOUNTER — Encounter: Payer: Self-pay | Admitting: Physical Medicine & Rehabilitation

## 2017-12-18 ENCOUNTER — Other Ambulatory Visit: Payer: Self-pay

## 2017-12-18 ENCOUNTER — Encounter: Payer: Medicare Other | Attending: Physical Medicine & Rehabilitation | Admitting: Physical Medicine & Rehabilitation

## 2017-12-18 VITALS — BP 137/69 | HR 73 | Ht 72.0 in | Wt 203.8 lb

## 2017-12-18 DIAGNOSIS — M47816 Spondylosis without myelopathy or radiculopathy, lumbar region: Secondary | ICD-10-CM

## 2017-12-18 DIAGNOSIS — M545 Low back pain: Secondary | ICD-10-CM | POA: Insufficient documentation

## 2017-12-18 DIAGNOSIS — Z79899 Other long term (current) drug therapy: Secondary | ICD-10-CM | POA: Insufficient documentation

## 2017-12-18 DIAGNOSIS — G8929 Other chronic pain: Secondary | ICD-10-CM | POA: Insufficient documentation

## 2017-12-18 DIAGNOSIS — Z87891 Personal history of nicotine dependence: Secondary | ICD-10-CM | POA: Insufficient documentation

## 2017-12-18 DIAGNOSIS — E1142 Type 2 diabetes mellitus with diabetic polyneuropathy: Secondary | ICD-10-CM | POA: Diagnosis not present

## 2017-12-18 DIAGNOSIS — Z951 Presence of aortocoronary bypass graft: Secondary | ICD-10-CM | POA: Insufficient documentation

## 2017-12-18 DIAGNOSIS — M5416 Radiculopathy, lumbar region: Secondary | ICD-10-CM | POA: Diagnosis not present

## 2017-12-18 DIAGNOSIS — K589 Irritable bowel syndrome without diarrhea: Secondary | ICD-10-CM | POA: Diagnosis not present

## 2017-12-18 DIAGNOSIS — I10 Essential (primary) hypertension: Secondary | ICD-10-CM | POA: Insufficient documentation

## 2017-12-18 DIAGNOSIS — E559 Vitamin D deficiency, unspecified: Secondary | ICD-10-CM | POA: Diagnosis not present

## 2017-12-18 DIAGNOSIS — Z8249 Family history of ischemic heart disease and other diseases of the circulatory system: Secondary | ICD-10-CM | POA: Insufficient documentation

## 2017-12-18 DIAGNOSIS — M7918 Myalgia, other site: Secondary | ICD-10-CM | POA: Diagnosis not present

## 2017-12-18 DIAGNOSIS — R0789 Other chest pain: Secondary | ICD-10-CM | POA: Diagnosis not present

## 2017-12-18 DIAGNOSIS — K219 Gastro-esophageal reflux disease without esophagitis: Secondary | ICD-10-CM | POA: Diagnosis not present

## 2017-12-18 DIAGNOSIS — E785 Hyperlipidemia, unspecified: Secondary | ICD-10-CM | POA: Insufficient documentation

## 2017-12-18 DIAGNOSIS — Z955 Presence of coronary angioplasty implant and graft: Secondary | ICD-10-CM | POA: Diagnosis not present

## 2017-12-18 DIAGNOSIS — I2510 Atherosclerotic heart disease of native coronary artery without angina pectoris: Secondary | ICD-10-CM | POA: Diagnosis not present

## 2017-12-18 DIAGNOSIS — G588 Other specified mononeuropathies: Secondary | ICD-10-CM | POA: Insufficient documentation

## 2017-12-18 DIAGNOSIS — M4185 Other forms of scoliosis, thoracolumbar region: Secondary | ICD-10-CM | POA: Diagnosis not present

## 2017-12-18 NOTE — Progress Notes (Signed)
Subjective:    Patient ID: [name], male    DOB: 1933/11/14, 83 y.o.   MRN: [id number]  HPI   [name] is here in follow-up of his chronic pain syndrome.  He continues to have ongoing pain in his chest and low back.  He had medial branch blocks twice this spring with some relief but not overwhelming least so.  He continues to stay active at home as much as he can.  Likes to work out in his yard.  He recently came back from a trip across the country.  Knee injection seemed to help at last visit.  Uses a cane for balance.  Pain is most prominent today in his low back.  It seems to bother him most when he bends although standing and walking can irritated as well.  He does do some stretching and occasionally uses heat.  Continues on gabapentin and Voltaren gel for pain relief.   Pain Inventory Average Pain 6 Pain Right Now 6 My pain is constant and aching  In the last 24 hours, has pain interfered with the following? General activity 10 Relation with others 4 Enjoyment of life 10 What TIME of day is your pain at its worst? morning Sleep (in general) Fair  Pain is worse with: walking and some activites Pain improves with: rest Relief from Meds: not on any meds  Mobility use a cane how many minutes can you walk? 5 ability to climb steps?  yes do you drive?  yes  Function retired  Neuro/Psych weakness numbness  Prior Studies Any changes since last visit?  no  Physicians involved in your care Any changes since last visit?  no   Family History  Problem Relation Age of Onset  . Heart attack Father        died age 16  . Heart attack Brother        died age 43  . Heart attack Sister        died age 26  . Colon cancer Sister   . Liver cancer Sister   . Diabetes Maternal Grandmother   . Colon polyps Sister        and brothers x 2    Social History   Socioeconomic History  . Marital status: Married    Spouse name: Not on file  . Number of children: 4     . Years of education: Not on file  . Highest education level: Not on file  Occupational History  . Occupation: Retired  Scientific laboratory technician  . Financial resource strain: Not on file  . Food insecurity:    Worry: Not on file    Inability: Not on file  . Transportation needs:    Medical: Not on file    Non-medical: Not on file  Tobacco Use  . Smoking status: Former Smoker    Last attempt to quit: 07/16/1963    Years since quitting: 54.4  . Smokeless tobacco: Never Used  Substance and Sexual Activity  . Alcohol use: No  . Drug use: No  . Sexual activity: Not Currently  Lifestyle  . Physical activity:    Days per week: Not on file    Minutes per session: Not on file  . Stress: Not on file  Relationships  . Social connections:    Talks on phone: Not on file    Gets together: Not on file    Attends religious service: Not on file    Active member of club  or organization: Not on file    Attends meetings of clubs or organizations: Not on file    Relationship status: Not on file  Other Topics Concern  . Not on file  Social History Narrative   1 caffeine drink daily    Past Surgical History:  Procedure Laterality Date  . BLADDER SURGERY  1969   traumatic pelvic fractures, urethral and bladder repair  . CARDIAC CATHETERIZATION N/A 07/01/2015   Procedure: Left Heart Cath and Coronary Angiography;  Surgeon: Wellington Hampshire, MD;  Location: [facility];  Service: Cardiovascular;  Laterality: N/A;  . COLON RESECTION N/A 05/17/2017   Procedure: DIAGNOSTIC LAPAROSCOPY,;  Surgeon: Leighton Ruff, MD;  Location: [facility];  Service: General;  Laterality: N/A;  . CORONARY ANGIOPLASTY WITH STENT PLACEMENT    . CORONARY ARTERY BYPASS GRAFT N/A 07/06/2015   Procedure: CORONARY ARTERY BYPASS GRAFTING (CABG)x 4   utilizing the left internal mammary artery and endoscopically harvested bilateral  sapheneous vein.;  Surgeon: Ivin Poot, MD;  Location: [facility];  Service: Open Heart Surgery;   Laterality: N/A;  . KNEE SURGERY    . TEE WITHOUT CARDIOVERSION N/A 07/06/2015   Procedure: TRANSESOPHAGEAL ECHOCARDIOGRAM (TEE);  Surgeon: Ivin Poot, MD;  Location: [facility];  Service: Open Heart Surgery;  Laterality: N/A;   Past Medical History:  Diagnosis Date  . 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 Colon polyps   . Coronary atherosclerosis of unspecified type of vessel, native or graft   . Difficult intubation   . Esophageal reflux   . Hypertension   . IBS (irritable bowel syndrome)   . Orthostatic hypotension    "BP has been dropping alot when I stand up for the last month or so" (02/17/2016)  . Vitamin B 12 deficiency   . Vitamin D deficiency    BP 137/69 (BP Location: Left Arm, Patient Position: Sitting, Cuff Size: Normal)   Pulse 73   Ht 6' (1.829 m)   Wt 203 lb 12.8 oz (92.4 kg)   SpO2 95%   BMI 27.64 kg/m   Opioid Risk Score:   Fall Risk Score:  `1  Depression screen PHQ 2/9  Depression screen [facility] 2/9 12/18/2017 09/04/2017 04/12/2017 04/04/2017 11/29/2016 03/22/2016 02/09/2016  Decreased Interest 0 0 0 0 0 3 0  Down, Depressed, Hopeless 0 0 0 0 0 1 0  PHQ - 2 Score 0 0 0 0 0 4 0  Altered sleeping - - - - - 2 -  Tired, decreased energy - - - - - 3 -  Change in appetite - - - - - 1 -  Feeling bad or failure about yourself  - - - - - 2 -  Trouble concentrating - - - - - 2 -  Moving slowly or fidgety/restless - - - - - 1 -  Suicidal thoughts - - - - - 0 -  PHQ-9 Score - - - - - 15 -  Difficult doing work/chores - - - - - Extremely dIfficult -   Review of Systems  Constitutional: Negative.   HENT: Negative.   Eyes: Negative.   Respiratory: Negative.   Cardiovascular: Negative.   Gastrointestinal: Negative.   Endocrine: Negative.   Genitourinary: Negative.   Musculoskeletal: Negative.   Skin: Negative.   Allergic/Immunologic: Negative.   Neurological: Negative.   Hematological: Negative.   Psychiatric/Behavioral: Negative.   All other systems reviewed and  are negative.      Objective:   Physical  Exam  General: No acute distress HEENT: EOMI, oral membranes moist Cards: reg rate  Chest: normal effort Abdomen: Soft, NT, ND Skin: dry, intact Extremities: no edema Skin:Clean and intact without signs of breakdown. CABG incision along sternum clean/intact Neuro:Pt is cognitively appropriate with normal insight, memory, and awareness. Cranial nerves 2-12 are intact. Decreased sensation to LT bilateral lower extremities below the calves.Reflexes are 2+ in all 4's. Fine motor coordination is intact. No tremors. Motor function remains5/5.  Musculoskeletal:lumbar levoscoliosis. Pain with extension and flexion of lumbar spine. Flexion caused more pain today. Right lumbar paraspinals taut, tender,  Rib cage tender. Left antalgia  Psych:pleasantand appropriate        1. Chronic right chest wall pain, intercostal neuritis, ribs 6 and 7. Likely triggered by  CABG.  2. Chronic low back pain, thoracolumbar levoscoliosis.Most recent lumbar MRI reveals critical DDD at L3-L5 and foraminal stenosis at these levels. Good results with recent right L4-5 T-LESI. Had equivocal results with medial branch blocks in the past. Presentation at this point is more consistent with a facet arthropathy however. 3. Diabetic polyneuropathy with ?autonomic dysfunction, hypotension 4. Right knee pain. Likely old meniscal injury/osteoarthritis--increased pain today   Plan: 1. Consider MBB's, but overall he seemed to have much better results with the right L4-5 ESI last Summer.  Will refer back to Dr. Letta Pate for these. 2.Maintain gabapentin 200mg twice daily.  3. Voltaren gel and lidocaine gel to back/ribs.  4. continue HEP and sensible activities 5.After informed consent and preparation of the skin with isopropyl alcohol, I injected the right lumbar paraspinals with 2cc of 1% lidocaine. The patient tolerated well, and no complications were  experienced. Post-injection instructions were provided.  6. Follow up in about 1 months. 25 minutes of face to face patient care time were spent during this visit. All questions were encouraged and answered.  Greater than 50% of the time today was spent reviewing his imaging and discussing pain generators and home exercise program.

## 2017-12-18 NOTE — Patient Instructions (Signed)
PLEASE FEEL FREE TO CALL OUR OFFICE WITH ANY PROBLEMS OR QUESTIONS ([phone])      

## 2017-12-20 DIAGNOSIS — C4441 Basal cell carcinoma of skin of scalp and neck: Secondary | ICD-10-CM | POA: Diagnosis not present

## 2017-12-26 DIAGNOSIS — M5134 Other intervertebral disc degeneration, thoracic region: Secondary | ICD-10-CM | POA: Diagnosis not present

## 2017-12-26 DIAGNOSIS — M9902 Segmental and somatic dysfunction of thoracic region: Secondary | ICD-10-CM | POA: Diagnosis not present

## 2017-12-30 ENCOUNTER — Other Ambulatory Visit: Payer: Self-pay | Admitting: Internal Medicine

## 2018-01-16 DIAGNOSIS — M9902 Segmental and somatic dysfunction of thoracic region: Secondary | ICD-10-CM | POA: Diagnosis not present

## 2018-01-16 DIAGNOSIS — M5134 Other intervertebral disc degeneration, thoracic region: Secondary | ICD-10-CM | POA: Diagnosis not present

## 2018-01-18 ENCOUNTER — Encounter: Payer: Medicare Other | Attending: Physical Medicine & Rehabilitation

## 2018-01-18 ENCOUNTER — Other Ambulatory Visit: Payer: Self-pay

## 2018-01-18 ENCOUNTER — Encounter: Payer: Self-pay | Admitting: Physical Medicine & Rehabilitation

## 2018-01-18 ENCOUNTER — Ambulatory Visit: Payer: Medicare Other | Admitting: Physical Medicine & Rehabilitation

## 2018-01-18 ENCOUNTER — Encounter: Payer: Self-pay | Admitting: Internal Medicine

## 2018-01-18 VITALS — BP 180/88 | HR 65 | Ht 72.0 in | Wt 202.0 lb

## 2018-01-18 DIAGNOSIS — E785 Hyperlipidemia, unspecified: Secondary | ICD-10-CM | POA: Insufficient documentation

## 2018-01-18 DIAGNOSIS — K589 Irritable bowel syndrome without diarrhea: Secondary | ICD-10-CM | POA: Insufficient documentation

## 2018-01-18 DIAGNOSIS — H53453 Other localized visual field defect, bilateral: Secondary | ICD-10-CM | POA: Diagnosis not present

## 2018-01-18 DIAGNOSIS — M4185 Other forms of scoliosis, thoracolumbar region: Secondary | ICD-10-CM | POA: Diagnosis not present

## 2018-01-18 DIAGNOSIS — Z955 Presence of coronary angioplasty implant and graft: Secondary | ICD-10-CM | POA: Diagnosis not present

## 2018-01-18 DIAGNOSIS — I2510 Atherosclerotic heart disease of native coronary artery without angina pectoris: Secondary | ICD-10-CM | POA: Insufficient documentation

## 2018-01-18 DIAGNOSIS — H5319 Other subjective visual disturbances: Secondary | ICD-10-CM | POA: Diagnosis not present

## 2018-01-18 DIAGNOSIS — E1142 Type 2 diabetes mellitus with diabetic polyneuropathy: Secondary | ICD-10-CM | POA: Diagnosis not present

## 2018-01-18 DIAGNOSIS — M545 Low back pain: Secondary | ICD-10-CM | POA: Insufficient documentation

## 2018-01-18 DIAGNOSIS — E559 Vitamin D deficiency, unspecified: Secondary | ICD-10-CM | POA: Insufficient documentation

## 2018-01-18 DIAGNOSIS — M5416 Radiculopathy, lumbar region: Secondary | ICD-10-CM

## 2018-01-18 DIAGNOSIS — Z79899 Other long term (current) drug therapy: Secondary | ICD-10-CM | POA: Diagnosis not present

## 2018-01-18 DIAGNOSIS — I10 Essential (primary) hypertension: Secondary | ICD-10-CM | POA: Insufficient documentation

## 2018-01-18 DIAGNOSIS — R0789 Other chest pain: Secondary | ICD-10-CM | POA: Insufficient documentation

## 2018-01-18 DIAGNOSIS — Z87891 Personal history of nicotine dependence: Secondary | ICD-10-CM | POA: Diagnosis not present

## 2018-01-18 DIAGNOSIS — G588 Other specified mononeuropathies: Secondary | ICD-10-CM | POA: Insufficient documentation

## 2018-01-18 DIAGNOSIS — Z951 Presence of aortocoronary bypass graft: Secondary | ICD-10-CM | POA: Diagnosis not present

## 2018-01-18 DIAGNOSIS — R51 Headache: Secondary | ICD-10-CM | POA: Diagnosis not present

## 2018-01-18 DIAGNOSIS — Z8249 Family history of ischemic heart disease and other diseases of the circulatory system: Secondary | ICD-10-CM | POA: Diagnosis not present

## 2018-01-18 DIAGNOSIS — K219 Gastro-esophageal reflux disease without esophagitis: Secondary | ICD-10-CM | POA: Diagnosis not present

## 2018-01-18 DIAGNOSIS — G8929 Other chronic pain: Secondary | ICD-10-CM | POA: Insufficient documentation

## 2018-01-18 LAB — HM DIABETES EYE EXAM

## 2018-01-18 NOTE — Progress Notes (Signed)
  [facility]   Name: [name] DOB:07-20-1933 MRN: [id number]  Date:01/18/2018  Physician: Alysia Penna, MD    Nurse/CMA: Truman Hayward, CMA  Allergies:  Allergies  Allergen Reactions  . Cymbalta [Duloxetine Hcl]     Dizziness  . Keflex [Cephalexin] Other (See Comments)    Reaction: unknown   . Simvastatin Other (See Comments)    Reaction: unknown   . Sudafed [Pseudoephedrine]     Dizziness    Consent Signed: Yes.    Is patient diabetic? Yes.    CBG today? 126  Pregnant: No. LMP: No LMP for male patient. (age 40-55)  Anticoagulants: no Anti-inflammatory: no Antibiotics: no  Procedure:  Position: Prone Start Time:10:01am  End Time:10:06am  Fluoro Time: 25  RN/CMA Menaal Russum,CMA Langley Ingalls,CMA    Time 9:17am 10:13    BP 180/88 202/96    Pulse 65 63    Respirations 14 14    O2 Sat 97 96    S/S 6 6    Pain Level 4/10 2/10     D/C home with wife, [name] & O X 3, D/C instructions reviewed, and sits independently.

## 2018-01-18 NOTE — Progress Notes (Signed)
Lumbar Right L4-5  epidural steroid injection under fluoroscopic guidance  Indication: Lumbosacral radiculitis is not relieved by medication management or other conservative care and interfering with self-care and mobility.  No  anticoagulant use.  Informed consent was obtained after describing risk and benefits of the procedure with the patient, this includes bleeding, bruising, infection, paralysis and medication side effects.  The patient wishes to proceed and has given written consent.  Patient was placed in a prone position.  The lumbar area was marked and prepped with Betadine.  It was entered with a 25-gauge 1-1/2 inch needle and one mL of 1% lidocaine was injected into the skin and subcutaneous tissue.  Then a 17-gauge spinal needle was inserted under fluoroscopic guidance into the L4-5 interlaminar  space under AP and Lateral imaging.  Once needle tip of approximated the posterior elements, a loss of resistance technique was utilized with lateral imaging.  A positive loss of resistance was obtained and then confirmed by injecting 2 mL's of Omnipaque 180.  Then a solution containing 1.5 mL's of 6mg /ml Celestone and 1.5 mL's of 1% lidocaine was injected.  The patient tolerated procedure well.  Post procedure instructions were given.  Please see post procedure form.

## 2018-01-18 NOTE — Patient Instructions (Signed)

## 2018-01-23 ENCOUNTER — Other Ambulatory Visit: Payer: Self-pay | Admitting: Internal Medicine

## 2018-01-24 ENCOUNTER — Ambulatory Visit (HOSPITAL_COMMUNITY)
Admission: RE | Admit: 2018-01-24 | Discharge: 2018-01-24 | Disposition: A | Discharge status: Home / Self Care | Payer: Medicare Other | Source: Ambulatory Visit | Attending: Physician Assistant | Admitting: Physician Assistant

## 2018-01-24 ENCOUNTER — Encounter: Payer: Self-pay | Admitting: Physician Assistant

## 2018-01-24 ENCOUNTER — Ambulatory Visit: Payer: Medicare Other | Admitting: Physician Assistant

## 2018-01-24 VITALS — BP 120/82 | Temp 97.4°F | Resp 16 | Ht 72.0 in | Wt 194.0 lb

## 2018-01-24 DIAGNOSIS — I6782 Cerebral ischemia: Secondary | ICD-10-CM | POA: Insufficient documentation

## 2018-01-24 DIAGNOSIS — R519 Headache, unspecified: Secondary | ICD-10-CM

## 2018-01-24 DIAGNOSIS — I10 Essential (primary) hypertension: Secondary | ICD-10-CM | POA: Diagnosis not present

## 2018-01-24 DIAGNOSIS — R2689 Other abnormalities of gait and mobility: Secondary | ICD-10-CM

## 2018-01-24 DIAGNOSIS — R51 Headache: Secondary | ICD-10-CM

## 2018-01-24 DIAGNOSIS — I6389 Other cerebral infarction: Secondary | ICD-10-CM | POA: Insufficient documentation

## 2018-01-24 DIAGNOSIS — G8929 Other chronic pain: Secondary | ICD-10-CM

## 2018-01-24 DIAGNOSIS — I639 Cerebral infarction, unspecified: Secondary | ICD-10-CM | POA: Diagnosis not present

## 2018-01-24 DIAGNOSIS — R42 Dizziness and giddiness: Secondary | ICD-10-CM | POA: Diagnosis present

## 2018-01-24 DIAGNOSIS — R001 Bradycardia, unspecified: Secondary | ICD-10-CM | POA: Diagnosis not present

## 2018-01-24 DIAGNOSIS — I6529 Occlusion and stenosis of unspecified carotid artery: Secondary | ICD-10-CM | POA: Diagnosis not present

## 2018-01-24 DIAGNOSIS — R002 Palpitations: Secondary | ICD-10-CM

## 2018-01-24 MED ORDER — CLOPIDOGREL BISULFATE 75 MG PO TABS: 75.0000 mg | ORAL_TABLET | Freq: Every day | ORAL | 11 refills | Status: DC

## 2018-01-24 NOTE — Progress Notes (Addendum)
ADDENDUM MRI/MRA SHOWED ACUTE SMALL STROKE RIGHT TEMPORAL STEM WILL REFER TO NEURO START ON PLAVIX DISCUSSED CRESTOR 5MG START BUT PATIENT WANTS TO SEE HOW HE DOES WITH PLAVIX FIRST  NEEDS ECHO/30 DAY HOLTER FOR PALPITATIONS ER PRECAUTIONS DISCUSSED WITH HIM AND HIS WIFE  Subjective:    Patient ID: [name], male    DOB: 01/01/1934, 84 y.o.   MRN: [id number]  HPI 84 y.o. WM with history of has Coronary atherosclerosis- s/p PCI to LAD in 2009 and PCI to RCA in 2011; Abdominal aortic aneurysm ([place]); GERD; History of IBS; Supine hypertension; Vitamin D deficiency; Vitamin B 12 deficiency; Mixed hyperlipidemia; PVD (peripheral vascular disease) ([place]); BMI 27.0-27.9,adult; S/P CABG x 4; Medication management; Intercostal neuralgia; Coronary artery disease involving coronary bypass graft of native heart without angina pectoris; Diabetes mellitus type 2, diet-controlled ([place]); CKD (chronic kidney disease), stage III ([place]); Dysautonomia orthostatic hypotension syndrome ([place]); Idiopathic scoliosis; Diabetic neuropathy ([place]); Lumbar facet arthropathy; DDD (degenerative disc disease), lumbar; Primary osteoarthritis of right knee; Thoracic radiculopathy; Cholelithiasis; Sinus Bradycardia; Myofascial pain; Spondylosis without myelopathy or radiculopathy, lumbar region; and Lumbar radiculopathy, right on their problem list. presents with dizziness.  He has developed dysautonomia with supine HTN and severe postural Hypotension managed with Florinef which his wife titrates to maintain standing BP. 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 He continues to follow with Dr. Tessa Lerner for pain management. He is on Neurontin and valium. Follows with Dr. Stanford Breed, has seen Dr. Lucia Gaskins in 2017, has never seen neurology, never had MRA.   He states that this dizziness is different from his normal dizziness x 2-3 weeks. He also has had a black spot when he looks down and to the left with both  eyes only. He has seen his eye doctor and had a ESR/CRP done but does not know results. He has back of head HA/tenderness x 1 month, worse when he lays down. Has radiation of posterior HA up ear to his right eye.   No history of fall/injury.  He states he has not felt right since his operation in 05/17/2017.   He has history of neuropathy up to knees that is unchanged.   He feels like things are getting caught in his throat and when he drink water he feels that it comes back up. Wife states that he gets choked a lot.   He has had some abnormal heart beats about a month ago but has been normal since then.   Denies nausea, vomiting, tinnitus, one sided weakness, hearing loss, diplopia.   He went to [place] state in Aug and did well there. No swelling in his legs, no pain in his legs.   Echo 2019 CT head 02/16/2017 IMPRESSION: No acute or traumatic finding. Chronic small-vessel ischemic changes affecting the cerebral hemispheric white matter. 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 Atherosclerotic calcification of the major vessels at the base of the brain.  Blood pressure 120/82, temperature (!) 97.4 F (36.3 C), resp. rate 16, height 6' (1.829 m), weight 194 lb (88 kg).  He is unable to tolerate statins and cholesterol medicaitons.  He is on 47m ASA.  Lab Results  Component Value Date   CHOL 180 10/26/2017   HDL 29 (L) 10/26/2017   LDLCALC 122 (H) 10/26/2017   TRIG 177 (H) 10/26/2017   CHOLHDL 6.2 (H) 10/26/2017     Current Outpatient Medications (Endocrine & Metabolic):  .  fludrocortisone (FLORINEF) 0.1 MG tablet, TAKE 2  TABLETS(200 MCG) BY MOUTH DAILY  Current Outpatient Medications (Cardiovascular):  .  verapamil (CALAN-SR) 240 MG CR tablet, Take 1 tablet daily with food   Current Outpatient Medications (Analgesics):  .  acetaminophen (TYLENOL) 325 MG tablet, Take 2 tablets (650 mg total) by mouth every 6 (six) hours as needed for mild pain or headache  (or Fever >/= 101). Marland Kitchen  aspirin EC 81 MG EC tablet, Take 1 tablet (81 mg total) by mouth daily.  Current Outpatient Medications (Hematological):  .  cyanocobalamin 1000 MCG tablet, Take 1,000 mcg by mouth daily.  Current Outpatient Medications (Other):  Marland Kitchen  CALCIUM PO, Take 1 tablet by mouth daily. .  Cholecalciferol (VITAMIN D PO), Take 2,000 Units by mouth 3 (three) times daily.  .  diazepam (VALIUM) 5 MG tablet, Take 1/2 to 1 tablet 1 to 2 x /day if needed for muscle spasms & please try to limit to 5 days /week to avoid addiction .  diclofenac sodium (VOLTAREN) 1 % GEL, APPLY TOPICALLY TO RIBS, BACK THREE TIMES DAILY .  gabapentin (NEURONTIN) 100 MG capsule, TAKE 2 CAPSULE(200 MG) BY MOUTH 2 TIMES A DAY. Marland Kitchen  glucose blood test strip, Check blood sugar 1 time daily-[id number] .  multivitamin (THERAGRAN) per tablet, Take 1 tablet by mouth daily.   Marland Kitchen  omeprazole (PRILOSEC) 20 MG capsule, TAKE 2 CAPSULES BY MOUTH EVERY DAY FOR INDIGESTION .  polyethylene glycol (MIRALAX / GLYCOLAX) packet, Take 17 g by mouth daily.  Allergies Allergies  Allergen Reactions  . Cymbalta [Duloxetine Hcl]     Dizziness  . Keflex [Cephalexin] Other (See Comments)    Reaction: unknown   . Simvastatin Other (See Comments)    Reaction: unknown   . Sudafed [Pseudoephedrine]     Dizziness    SURGICAL HISTORY He  has a past surgical history that includes Knee surgery; Bladder surgery (1969); Coronary angioplasty with stent; Cardiac catheterization (N/A, 07/01/2015); Coronary artery bypass graft (N/A, 07/06/2015); TEE without cardioversion (N/A, 07/06/2015); and Colon resection (N/A, 05/17/2017). FAMILY HISTORY His family history includes Colon cancer in his sister; Colon polyps in his sister; Diabetes in his maternal grandmother; Heart attack in his brother, father, and sister; Liver cancer in his sister. SOCIAL HISTORY He  reports that he quit smoking about 54 years ago. He has never used smokeless tobacco. He reports  that he does not drink alcohol or use drugs.   Review of Systems See HPI    Objective:   Physical Exam  Constitutional: He is oriented to person, place, and time. He appears well-developed and well-nourished.  HENT:  Head: Normocephalic and atraumatic.  Right Ear: External ear normal.  Left Ear: External ear normal.  Mouth/Throat: Oropharynx is clear and moist.  Eyes: Pupils are equal, round, and reactive to light. Conjunctivae and EOM are normal.  Neck: Normal range of motion. Neck supple.  Cardiovascular: Normal rate, regular rhythm and normal heart sounds.  Pulmonary/Chest: Effort normal and breath sounds normal.  Abdominal: Soft. Bowel sounds are normal.  Musculoskeletal: Normal range of motion.  Neurological: He is alert and oriented to person, place, and time. No cranial nerve deficit.  Cranial nerves: Facial symmetry is present. There is good sensation of the face to pinprick and soft touch bilaterally. The strength of the facial muscles and the muscles to head turning and shoulder shrug are normal bilaterally. Speech is well enunciated, no aphasia or dysarthria is noted. Sluggish pupil response at 2 mm, nystagmus to the up and right, abnormal visual field.  The tongue is midline, and the patient has symmetric elevation of the soft palate. Motor: The motor testing reveals 4 over 5 strength of all 4 extremities. Good symmetric motor tone is noted throughout.   Coordination: Cerebellar testing reveals good finger-nose-finger and heel-to-shin bilaterally.  Gait and station: Gait is unsteady with a cane. Tandem gait is not tested. Romberg is positive. No drift is seen.    Skin: Skin is warm and dry.  Psychiatric: He has a normal mood and affect. His behavior is normal.       Assessment & Plan:    Palpitation -     EKG 12-Lead- normal- may need holter pending MRI results  Chronic nonintractable headache, unspecified headache type Get labs rule out TA Get MRI/MRA to rule out  stroke -     Ambulatory referral to Neurology -     MR MRA HEAD WO CONTRAST; Future -     CBC with Differential/Platelet -     COMPLETE METABOLIC PANEL WITH GFR -     TSH -     C-reactive protein -     Sedimentation rate -     MR Brain Wo Contrast; Future

## 2018-01-24 NOTE — Addendum Note (Signed)
Addended by: Vicie Mutters R on: 01/24/2018 03:00 PM   Modules accepted: Orders

## 2018-01-24 NOTE — Patient Instructions (Signed)
Stroke Prevention °Some medical conditions and behaviors are associated with a higher chance of having a stroke. You can help prevent a stroke by making nutrition, lifestyle, and other changes, including managing any medical conditions you may have. °What nutrition changes can be made? °· Eat healthy foods. You can do this by: °? Choosing foods high in fiber, such as fresh fruits and vegetables and whole grains. °? Eating at least 5 or more servings of fruits and vegetables a day. Try to fill half of your plate at each meal with fruits and vegetables. °? Choosing lean protein foods, such as lean cuts of meat, poultry without skin, fish, tofu, beans, and nuts. °? Eating low-fat dairy products. °? Avoiding foods that are high in salt (sodium). This can help lower blood pressure. °? Avoiding foods that have saturated fat, trans fat, and cholesterol. This can help prevent high cholesterol. °? Avoiding processed and premade foods. °· Follow your health care provider's specific guidelines for losing weight, controlling high blood pressure (hypertension), lowering high cholesterol, and managing diabetes. These may include: °? Reducing your daily calorie intake. °? Limiting your daily sodium intake to 1,500 milligrams (mg). °? Using only healthy fats for cooking, such as olive oil, canola oil, or sunflower oil. °? Counting your daily carbohydrate intake. °What lifestyle changes can be made? °· Maintain a healthy weight. Talk to your health care provider about your ideal weight. °· Get at least 30 minutes of moderate physical activity at least 5 days a week. Moderate activity includes brisk walking, biking, and swimming. °· Do not use any products that contain nicotine or tobacco, such as cigarettes and e-cigarettes. If you need help quitting, ask your health care provider. It may also be helpful to avoid exposure to secondhand smoke. °· Limit alcohol intake to no more than 1 drink a day for nonpregnant women and 2 drinks a  day for men. One drink equals 12 oz of beer, 5 oz of wine, or 1½ oz of hard liquor. °· Stop any illegal drug use. °· Avoid taking birth control pills. Talk to your health care provider about the risks of taking birth control pills if: °? You are over 35 years old. °? You smoke. °? You get migraines. °? You have ever had a blood clot. °What other changes can be made? °· Manage your cholesterol levels. °? Eating a healthy diet is important for preventing high cholesterol. If cholesterol cannot be managed through diet alone, you may also need to take medicines. °? Take any prescribed medicines to control your cholesterol as told by your health care provider. °· Manage your diabetes. °? Eating a healthy diet and exercising regularly are important parts of managing your blood sugar. If your blood sugar cannot be managed through diet and exercise, you may need to take medicines. °? Take any prescribed medicines to control your diabetes as told by your health care provider. °· Control your hypertension. °? To reduce your risk of stroke, try to keep your blood pressure below 130/80. °? Eating a healthy diet and exercising regularly are an important part of controlling your blood pressure. If your blood pressure cannot be managed through diet and exercise, you may need to take medicines. °? Take any prescribed medicines to control hypertension as told by your health care provider. °? Ask your health care provider if you should monitor your blood pressure at home. °? Have your blood pressure checked every year, even if your blood pressure is normal. Blood pressure increases with   age and some medical conditions. °· Get evaluated for sleep disorders (sleep apnea). Talk to your health care provider about getting a sleep evaluation if you snore a lot or have excessive sleepiness. °· Take over-the-counter and prescription medicines only as told by your health care provider. Aspirin or blood thinners (antiplatelets or  anticoagulants) may be recommended to reduce your risk of forming blood clots that can lead to stroke. °· Make sure that any other medical conditions you have, such as atrial fibrillation or atherosclerosis, are managed. °What are the warning signs of a stroke? °The warning signs of a stroke can be easily remembered as BEFAST. °· B is for balance. Signs include: °? Dizziness. °? Loss of balance or coordination. °? Sudden trouble walking. °· E is for eyes. Signs include: °? A sudden change in vision. °? Trouble seeing. °· F is for face. Signs include: °? Sudden weakness or numbness of the face. °? The face or eyelid drooping to one side. °· A is for arms. Signs include: °? Sudden weakness or numbness of the arm, usually on one side of the body. °· S is for speech. Signs include: °? Trouble speaking (aphasia). °? Trouble understanding. °· T is for time. °? These symptoms may represent a serious problem that is an emergency. Do not wait to see if the symptoms will go away. Get medical help right away. Call your local emergency services (911 in the [place] Do not drive yourself to the hospital. °· Other signs of stroke may include: °? A sudden, severe headache with no known cause. °? Nausea or vomiting. °? Seizure. ° °Where to find more information: °For more information, visit: °· American Stroke Association: www.strokeassociation.org °· National Stroke Association: www.stroke.org ° °Summary °· You can prevent a stroke by eating healthy, exercising, not smoking, limiting alcohol intake, and managing any medical conditions you may have. °· Do not use any products that contain nicotine or tobacco, such as cigarettes and e-cigarettes. If you need help quitting, ask your health care provider. It may also be helpful to avoid exposure to secondhand smoke. °· Remember BEFAST for warning signs of stroke. Get help right away if you or a loved one has any of these signs. °This information is not intended to replace advice given  to you by your health care provider. Make sure you discuss any questions you have with your health care provider. °Document Released: 05/26/2004 Document Revised: 05/24/2016 Document Reviewed: 05/24/2016 °Elsevier Interactive Patient Education © 2018 [employer] ° °

## 2018-01-25 ENCOUNTER — Telehealth: Payer: Self-pay | Admitting: *Deleted

## 2018-01-25 LAB — CBC WITH DIFFERENTIAL/PLATELET
Basophils Absolute: 33 cells/uL (ref 0–200)
Basophils Relative: 0.4 %
Eosinophils Absolute: 191 cells/uL (ref 15–500)
Eosinophils Relative: 2.3 %
HCT: 41 % (ref 38.5–50.0)
Hemoglobin: 13.4 g/dL (ref 13.2–17.1)
Lymphs Abs: 2274 cells/uL (ref 850–3900)
MCH: 30.2 pg (ref 27.0–33.0)
MCHC: 32.7 g/dL (ref 32.0–36.0)
MCV: 92.6 fL (ref 80.0–100.0)
MPV: 10 fL (ref 7.5–12.5)
Monocytes Relative: 6.4 %
Neutro Abs: 5271 cells/uL (ref 1500–7800)
Neutrophils Relative %: 63.5 %
Platelets: 247 10*3/uL (ref 140–400)
RBC: 4.43 10*6/uL (ref 4.20–5.80)
RDW: 13.1 % (ref 11.0–15.0)
Total Lymphocyte: 27.4 %
WBC mixed population: 531 cells/uL (ref 200–950)
WBC: 8.3 10*3/uL (ref 3.8–10.8)

## 2018-01-25 LAB — C-REACTIVE PROTEIN: CRP: 19.1 mg/L — ABNORMAL HIGH (ref <8.0)

## 2018-01-25 LAB — COMPLETE METABOLIC PANEL WITH GFR
AG Ratio: 1.5 (calc) (ref 1.0–2.5)
ALT: 11 U/L (ref 9–46)
AST: 19 U/L (ref 10–35)
Albumin: 4 g/dL (ref 3.6–5.1)
Alkaline phosphatase (APISO): 90 U/L (ref 40–115)
BUN/Creatinine Ratio: 18 (calc) (ref 6–22)
BUN: 22 mg/dL (ref 7–25)
CO2: 32 mmol/L (ref 20–32)
Calcium: 9.3 mg/dL (ref 8.6–10.3)
Chloride: 103 mmol/L (ref 98–110)
Creat: 1.22 mg/dL — ABNORMAL HIGH (ref 0.70–1.11)
GFR, Est African American: 64 mL/min/{1.73_m2} (ref >=60)
GFR, Est Non African American: 55 mL/min/{1.73_m2} — ABNORMAL LOW (ref >=60)
Globulin: 2.7 g/dL (calc) (ref 1.9–3.7)
Glucose, Bld: 105 mg/dL — ABNORMAL HIGH (ref 65–99)
Potassium: 3.9 mmol/L (ref 3.5–5.3)
Sodium: 142 mmol/L (ref 135–146)
Total Bilirubin: 0.7 mg/dL (ref 0.2–1.2)
Total Protein: 6.7 g/dL (ref 6.1–8.1)

## 2018-01-25 LAB — SEDIMENTATION RATE: Sed Rate: 17 mm/h (ref 0–20)

## 2018-01-25 LAB — TSH: TSH: 1.9 mIU/L (ref 0.40–4.50)

## 2018-01-25 NOTE — Telephone Encounter (Signed)
Patient called and asked about the choice of Crestor, instead of Pravastatin.  Per Dr Melford Aase and Vicie Mutters, he has had an intolerance to Pravastatin and Atorvastatin, and Crestor is less likely to cause side effects.  The patient is aware and will discuss at his 01/2018 appointment.

## 2018-01-26 ENCOUNTER — Encounter: Payer: Self-pay | Admitting: Cardiology

## 2018-01-26 ENCOUNTER — Telehealth: Payer: Self-pay | Admitting: Cardiology

## 2018-01-26 ENCOUNTER — Ambulatory Visit: Payer: Medicare Other | Admitting: Cardiology

## 2018-01-26 VITALS — BP 130/82 | HR 78 | Ht 72.0 in | Wt 201.0 lb

## 2018-01-26 DIAGNOSIS — E7800 Pure hypercholesterolemia, unspecified: Secondary | ICD-10-CM | POA: Diagnosis not present

## 2018-01-26 DIAGNOSIS — I714 Abdominal aortic aneurysm, without rupture, unspecified: Secondary | ICD-10-CM

## 2018-01-26 DIAGNOSIS — I951 Orthostatic hypotension: Secondary | ICD-10-CM | POA: Diagnosis not present

## 2018-01-26 DIAGNOSIS — I2510 Atherosclerotic heart disease of native coronary artery without angina pectoris: Secondary | ICD-10-CM

## 2018-01-26 DIAGNOSIS — I639 Cerebral infarction, unspecified: Secondary | ICD-10-CM | POA: Diagnosis not present

## 2018-01-26 MED ORDER — EZETIMIBE 10 MG PO TABS: 10.0000 mg | ORAL_TABLET | Freq: Every day | ORAL | 3 refills | Status: DC

## 2018-01-26 NOTE — Patient Instructions (Signed)
Medication Instructions:   START EZETIMIBE 10 MG ONCE DAILY   Labwork:  Your physician recommends that you return for lab work in: [place]  Testing/Procedures:  Your physician has requested that you have an echocardiogram. Echocardiography is a painless test that uses sound waves to create images of your heart. It provides your doctor with information about the size and shape of your heart and how well your heart's chambers and valves are working. This procedure takes approximately one hour. There are no restrictions for this procedure.   Your physician has requested that you have an abdominal aorta duplex. During this test, an ultrasound is used to evaluate the aorta. Allow 30 minutes for this exam. Do not eat after midnight the day before and avoid carbonated beverages   Follow-Up:  Your physician recommends that you schedule a follow-up appointment in: [place]

## 2018-01-26 NOTE — Telephone Encounter (Signed)
New Message:     Pt was seen today by Dr Stanford Breed. He wants pt to start on Ezetimibe. Pt have not started it, because he is scheduled to have a procedure on Wednesday. Please call to advise pt.

## 2018-01-26 NOTE — Telephone Encounter (Signed)
Spoke with pts wife who states her husband was started on Ezetimibe today by Dr. Stanford Breed and wanted to know if he need to hold medication prior to his biopsy. Informed wife the Ezetimibe is a medication for cholesterol as wife thought it was a blood thinner. Wife verbalized understanding.

## 2018-01-26 NOTE — Progress Notes (Signed)
HPI: FU CAD h/o HTN, postural hypotension and HLD. Cath 3/17 revealed 3 V CAD, echo showed normal LVF. Preoperative evaluation included carotids and ABIs that were normal. He underwent CABG x 4 on 07/07/15. Post op course was unremarkable. He has had difficulties with orthostasis and hypotension since CABG.  Echocardiogram January 2019 showed normal LV function, grade 1 diastolic dysfunction and mild right ventricular enlargement.  Abdominal ultrasound February 2019 showed 3.8 cm abdominal aortic aneurysm.  Seen recently by primary care for increasing dizziness.  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  Since last seen, there is no dyspnea, chest pain.  He occasionally has palpitations.  Last time was 1 month ago.  This has been long-standing.  He continues to have dizziness but since his recent stroke it occurs lying, sitting and standing.  Current Outpatient Medications  Medication Sig Dispense Refill  . acetaminophen (TYLENOL) 325 MG tablet Take 2 tablets (650 mg total) by mouth every 6 (six) hours as needed for mild pain or headache (or Fever >/= 101). 40 tablet 0  . aspirin EC 81 MG EC tablet Take 1 tablet (81 mg total) by mouth daily.    Marland Kitchen CALCIUM PO Take 1 tablet by mouth daily.    . Cholecalciferol (VITAMIN D PO) Take 2,000 Units by mouth 3 (three) times daily.     . clopidogrel (PLAVIX) 75 MG tablet Take 1 tablet (75 mg total) by mouth daily. 30 tablet 11  . cyanocobalamin 1000 MCG tablet Take 1,000 mcg by mouth daily.    . diazepam (VALIUM) 5 MG tablet Take 1/2 to 1 tablet 1 to 2 x /day if needed for muscle spasms & please try to limit to 5 days /week to avoid addiction 60 tablet 0  . diclofenac sodium (VOLTAREN) 1 % GEL APPLY TOPICALLY TO RIBS, BACK THREE TIMES DAILY 300 g 2  . fludrocortisone (FLORINEF) 0.1 MG tablet TAKE 2 TABLETS(200 MCG) BY MOUTH DAILY 180 tablet 0  . gabapentin (NEURONTIN) 100 MG capsule TAKE 2 CAPSULE(200 MG) BY MOUTH 2 TIMES A  DAY. 360 capsule 3  . glucose blood test strip Check blood sugar 1 time daily-[id number] 100 each 5  . multivitamin (THERAGRAN) per tablet Take 1 tablet by mouth daily.      Marland Kitchen omeprazole (PRILOSEC) 20 MG capsule TAKE 2 CAPSULES BY MOUTH EVERY DAY FOR INDIGESTION 180 capsule 1  . polyethylene glycol (MIRALAX / GLYCOLAX) packet Take 17 g by mouth daily. 28 each 1  . verapamil (CALAN-SR) 240 MG CR tablet Take 1 tablet daily with food 90 tablet 3   No current facility-administered medications for this visit.      Past Medical History:  Diagnosis Date  . 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 Colon polyps   . Coronary atherosclerosis of unspecified type of vessel, native or graft   . Difficult intubation   . Esophageal reflux   . Hypertension   . IBS (irritable bowel syndrome)   . Orthostatic hypotension    "BP has been dropping alot when I stand up for the last month or so" (02/17/2016)  . Vitamin B 12 deficiency   . Vitamin D deficiency     Past Surgical History:  Procedure Laterality Date  . BLADDER SURGERY  1969   traumatic pelvic fractures, urethral and bladder repair  . CARDIAC CATHETERIZATION N/A 07/01/2015   Procedure: Left Heart Cath and Coronary Angiography;  Surgeon: Wellington Hampshire, MD;  Location: [facility];  Service: Cardiovascular;  Laterality: N/A;  . COLON RESECTION N/A 05/17/2017   Procedure: DIAGNOSTIC LAPAROSCOPY,;  Surgeon: Leighton Ruff, MD;  Location: [facility];  Service: General;  Laterality: N/A;  . CORONARY ANGIOPLASTY WITH STENT PLACEMENT    . CORONARY ARTERY BYPASS GRAFT N/A 07/06/2015   Procedure: CORONARY ARTERY BYPASS GRAFTING (CABG)x 4   utilizing the left internal mammary artery and endoscopically harvested bilateral  sapheneous vein.;  Surgeon: Ivin Poot, MD;  Location: [facility];  Service: Open Heart Surgery;  Laterality: N/A;  . KNEE SURGERY    . TEE WITHOUT CARDIOVERSION N/A 07/06/2015   Procedure: TRANSESOPHAGEAL ECHOCARDIOGRAM (TEE);  Surgeon:  Ivin Poot, MD;  Location: [facility];  Service: Open Heart Surgery;  Laterality: N/A;    Social History   Socioeconomic History  . Marital status: Married    Spouse name: Not on file  . Number of children: 4   . Years of education: Not on file  . Highest education level: Not on file  Occupational History  . Occupation: Retired  Scientific laboratory technician  . Financial resource strain: Not on file  . Food insecurity:    Worry: Not on file    Inability: Not on file  . Transportation needs:    Medical: Not on file    Non-medical: Not on file  Tobacco Use  . Smoking status: Former Smoker    Last attempt to quit: 07/16/1963    Years since quitting: 54.5  . Smokeless tobacco: Never Used  Substance and Sexual Activity  . Alcohol use: No  . Drug use: No  . Sexual activity: Not Currently  Lifestyle  . Physical activity:    Days per week: Not on file    Minutes per session: Not on file  . Stress: Not on file  Relationships  . Social connections:    Talks on phone: Not on file    Gets together: Not on file    Attends religious service: Not on file    Active member of club or organization: Not on file    Attends meetings of clubs or organizations: Not on file    Relationship status: Not on file  . Intimate partner violence:    Fear of current or ex partner: Not on file    Emotionally abused: Not on file    Physically abused: Not on file    Forced sexual activity: Not on file  Other Topics Concern  . Not on file  Social History Narrative   1 caffeine drink daily     Family History  Problem Relation Age of Onset  . Heart attack Father        died age 41  . Heart attack Brother        died age 21  . Heart attack Sister        died age 82  . Colon cancer Sister   . Liver cancer Sister   . Diabetes Maternal Grandmother   . Colon polyps Sister        and brothers x 2     ROS: no fevers or chills, productive cough, hemoptysis, dysphasia, odynophagia, melena, hematochezia, dysuria,  hematuria, rash, seizure activity, orthopnea, PND, pedal edema, claudication. Remaining systems are negative.  Physical Exam: Well-developed well-nourished in no acute distress.  Skin is warm and dry.  HEENT is normal.  Neck is supple.  Chest is clear to auscultation with normal expansion.  Cardiovascular exam is regular rate and  rhythm.  Abdominal exam nontender or distended. No masses palpated. Extremities show no edema. neuro grossly intact  A/P  1 coronary artery disease-patient doing well with no chest pain.  Plan medical therapy with aspirin.  Intolerant to statins.  2 orthostatic hypotension-patient has significant issues with orthostasis.  Midodrine did not help previously.  We will continue with Florinef.  I have asked him to maintain fluid and sodium intake.  Note previously unable to tolerate abdominal binder.  3 hyperlipidemia-patient is intolerant to statins.  Recent LDL was 122.  Add Zetia 10 mg daily.  Check lipids and liver in 4 weeks.  If LDL remains high we will need to consider Repatha.  4 abdominal aortic aneurysm-we will arrange follow-up abdominal ultrasound.  5 recent CVA-patient will need follow-up with primary care.  I will arrange transthoracic echocardiogram.  If neurology feels TEE indicated we can arrange.  I will also refer to electrophysiology for implantable loop to exclude atrial fibrillation particularly in light of history of palpitations.  Kirk Ruths, MD

## 2018-01-29 ENCOUNTER — Encounter: Payer: Self-pay | Admitting: Internal Medicine

## 2018-01-29 NOTE — Progress Notes (Signed)
[facility]   [name], M.D.     Uvaldo Bristle. Silverio Lay, P.A.-C Liane Comber, Cedar                [address] [place], [place] [address] Telephone [phone] Telefax [phone] Annual  Screening/Preventative Visit  & Comprehensive Evaluation & Examination     This very nice 84 y.o. MWM presents for a Screening /Preventative Visit & comprehensive evaluation and management of multiple medical co-morbidities.  Patient has been followed for HTN, HLD, ASCAD/CABG, Prediabetes and Vitamin D Deficiency.      Patient had recently been having disabling dizziness and MRI 1 week ago found a small acute Rt Temporal stem CVA and he was started on Plavix w/his LD bASA. Patient had consultation by Dr Stanford Breed and is scheduled for cardiac echo and Holter monitoring. Earlier today, patient had consultation with Dr Jaynee Eagles and she recommended continuing Plavix, & strict control of  blood sugar and Lipids.      Patient has long hx/o labile  HTN predates since 1995. In 2005 and 2011, he underwent PCA/Stents and then CABG in 2017 and since then his HTN has evolved into orthostatic Hypotension with supine Hypertension.  His wife closely monitors his standing BP's & accordingly regulates his dosing of Florinef.  Today's BP is at goal - 124/66. Patient denies any cardiac symptoms as chest pain, palpitations, shortness of breath, dizziness or ankle swelling.     Patient's hyperlipidemia is not controlled with diet and patient is on Zetia, but alleges intolerance to several Statins and is reticent to take statins. Patient denies myalgias or other medication SE's. Last lipids were not at goal: Lab Results  Component Value Date   CHOL 180 10/26/2017   HDL 29 (L) 10/26/2017   LDLCALC 122 (H) 10/26/2017   TRIG 177 (H) 10/26/2017   CHOLHDL 6.2 (H) 10/26/2017      Patient has hx/o prediabetes since 2008 and patient denies  reactive hypoglycemic symptoms, visual blurring, diabetic polys or paresthesias. Last A1c was not at goal: Lab Results  Component Value Date   HGBA1C 5.8 (H) 10/26/2017       Finally, patient has history of Vitamin D Deficiency and last vitamin D was at goal: Lab Results  Component Value Date   VD25OH 71 07/06/2017   Current Outpatient Medications on File Prior to Visit  Medication Sig  . acetaminophen (TYLENOL) 325 MG tablet Take 2 tablets (650 mg total) by mouth every 6 (six) hours as needed for mild pain or headache (or Fever >/= 101).  Marland Kitchen aspirin EC 81 MG EC tablet Take 1 tablet (81 mg total) by mouth daily.  Marland Kitchen CALCIUM PO Take 1 tablet by mouth daily.  . Cholecalciferol (VITAMIN D PO) Take 2,000 Units by mouth 3 (three) times daily.   . clopidogrel (PLAVIX) 75 MG tablet Take 1 tablet (75 mg total) by mouth daily.  . diclofenac sodium (VOLTAREN) 1 % GEL APPLY TOPICALLY TO RIBS, BACK THREE TIMES DAILY  . ezetimibe (ZETIA) 10 MG tablet Take 1 tablet (10 mg total) by mouth daily.  . fludrocortisone (FLORINEF) 0.1 MG tablet TAKE 2 TABLETS(200 MCG) BY MOUTH DAILY  . gabapentin (NEURONTIN) 100 MG capsule TAKE 2 CAPSULE(200 MG) BY MOUTH 2 TIMES A DAY.  Marland Kitchen glucose blood test strip Check blood sugar 1 time daily-[id number]  . multivitamin (THERAGRAN) per tablet  Take 1 tablet by mouth daily.    Marland Kitchen omeprazole (PRILOSEC) 20 MG capsule TAKE 2 CAPSULES BY MOUTH EVERY DAY FOR INDIGESTION  . polyethylene glycol (MIRALAX / GLYCOLAX) packet Take 17 g by mouth daily. (Patient taking differently: Take 17 g by mouth 2 (two) times daily. )   No current facility-administered medications on file prior to visit.    Allergies  Allergen Reactions  . Cymbalta [Duloxetine Hcl]     Dizziness  . Keflex [Cephalexin] Other (See Comments)    Reaction: unknown   . Simvastatin Other (See Comments)    Reaction: unknown   . Sudafed [Pseudoephedrine]     Dizziness   Past Medical History:  Diagnosis Date  . Aneurysm  of iliac artery (HCC)   . Colon polyps   . Coronary atherosclerosis of unspecified type of vessel, native or graft   . Diabetes ([place])   . Difficult intubation   . Esophageal reflux   . High cholesterol   . Hypertension    pt denies, he says he has a h/o hypotension. If BP up he adjusts the Florinef  . IBS (irritable bowel syndrome)   . Orthostatic hypotension    "BP has been dropping alot when I stand up for the last month or so" (02/17/2016)  . Vitamin B 12 deficiency   . Vitamin D deficiency    Health Maintenance  Topic Date Due  . OPHTHALMOLOGY EXAM  07/12/2017  . URINE MICROALBUMIN  11/29/2017  . INFLUENZA VACCINE  11/30/2017  . FOOT EXAM  04/04/2018  . HEMOGLOBIN A1C  04/27/2018  . TETANUS/TDAP  10/02/2022  . PNA vac Low Risk Adult  Completed   Immunization History  Administered Date(s) Administered  . Influenza, High Dose Seasonal PF 12/30/2013, 02/02/2015, 01/27/2016  . Influenza-Unspecified 01/13/2013, 01/29/2017, 01/30/2018  . Pneumococcal Conjugate-13 02/14/2014  . Pneumococcal-Unspecified 06/15/2004  . Tetanus 10/01/2012  . Zoster 10/01/2012   Last Colon - 06/26/2010 - Dr Henrene Pastor - Normal & no F/U recommended  Past Surgical History:  Procedure Laterality Date  . BLADDER SURGERY  1969   traumatic pelvic fractures, urethral and bladder repair  . CARDIAC CATHETERIZATION N/A 07/01/2015   Procedure: Left Heart Cath and Coronary Angiography;  Surgeon: Wellington Hampshire, MD;  Location: [facility];  Service: Cardiovascular;  Laterality: N/A;  . COLON RESECTION N/A 05/17/2017   Procedure: DIAGNOSTIC LAPAROSCOPY,;  Surgeon: Leighton Ruff, MD;  Location: [facility];  Service: General;  Laterality: N/A;  . CORONARY ANGIOPLASTY WITH STENT PLACEMENT    . CORONARY ARTERY BYPASS GRAFT N/A 07/06/2015   Procedure: CORONARY ARTERY BYPASS GRAFTING (CABG)x 4   utilizing the left internal mammary artery and endoscopically harvested bilateral  sapheneous vein.;  Surgeon: Ivin Poot, MD;  Location: [facility];  Service: Open Heart Surgery;  Laterality: N/A;  . KNEE SURGERY    . TEE WITHOUT CARDIOVERSION N/A 07/06/2015   Procedure: TRANSESOPHAGEAL ECHOCARDIOGRAM (TEE);  Surgeon: Ivin Poot, MD;  Location: [facility];  Service: Open Heart Surgery;  Laterality: N/A;   Family History  Problem Relation Age of Onset  . Heart attack Father        died age 48  . Heart attack Brother        died age 28  . Anuerysm Brother        aortic  . Heart attack Sister        died age 80  . Colon cancer Sister   . Liver cancer Sister   .  Diabetes Maternal Grandmother   . Colon polyps Sister        and brothers x 2    Social History   Socioeconomic History  . Marital status: Married    Spouse name: Not on file  . Number of children: 2  . Years of education: Not on file  . Highest education level: High school graduate  Occupational History  . Occupation: Retired  Tobacco Use  . Smoking status: Former Smoker    Last attempt to quit: 07/16/1963    Years since quitting: 54.5  . Smokeless tobacco: Former Systems developer    Types: Chew    Quit date: 1989  Substance and Sexual Activity  . Alcohol use: No  . Drug use: No  . Sexual activity: Not Currently  Social History Narrative   Lives at home with his wife Estill Bamberg   Right handed   No caffeine    ROS Constitutional: Denies fever, chills, weight loss/gain, headaches, insomnia,  night sweats or change in appetite. Does c/o fatigue. Eyes: Denies redness, blurred vision, diplopia, discharge, itchy or watery eyes.  ENT: Denies discharge, congestion, post nasal drip, epistaxis, sore throat, earache, hearing loss, dental pain, Tinnitus, Vertigo, Sinus pain or snoring.  Cardio: Denies chest pain, palpitations, irregular heartbeat, syncope, dyspnea, diaphoresis, orthopnea, PND, claudication or edema Respiratory: denies cough, dyspnea, DOE, pleurisy, hoarseness, laryngitis or wheezing.  Gastrointestinal: Denies dysphagia, heartburn, reflux,  water brash, pain, cramps, nausea, vomiting, bloating, diarrhea, constipation, hematemesis, melena, hematochezia, jaundice or hemorrhoids Genitourinary: Denies dysuria, frequency, discharge, hematuria or flank pain. Has urgency, nocturia x 2-3 & occasional hesitancy. Musculoskeletal: Denies arthralgia, myalgia, stiffness, Jt. Swelling, pain, limp or strain/sprain. Denies Falls. Skin: Denies puritis, rash, hives, warts, acne, eczema or change in skin lesion Neuro: No weakness, tremor, incoordination, spasms, paresthesia or pain Psychiatric: Denies confusion, memory loss or sensory loss. Denies Depression. Endocrine: Denies change in weight, skin, hair change, nocturia, and paresthesia, diabetic polys, visual blurring or hyper / hypo glycemic episodes.  Heme/Lymph: No excessive bleeding, bruising or enlarged lymph nodes.  Physical Exam  BP 124/66   P 72   Te 97.9 F  R 18   Ht 6'    Wt 199 lb 9.6 oz   BMI 27.07   Postural Sitting BP 177/96  P 59     &     Standing BP 164/90   P 90  General Appearance: Well nourished and well groomed and in no apparent distress.  Eyes: PERRLA, EOMs, conjunctiva no swelling or erythema, normal fundi and vessels. Sinuses: No frontal/maxillary tenderness ENT/Mouth: EACs patent / TMs  nl. Nares clear without erythema, swelling, mucoid exudates. Oral hygiene is good. No erythema, swelling, or exudate. Tongue normal, non-obstructing. Tonsils not swollen or erythematous. Hearing normal.  Neck: Supple, thyroid not palpable. No bruits, nodes or JVD. Respiratory: Respiratory effort normal.  BS equal and clear bilateral without rales, rhonci, wheezing or stridor. Cardio: Heart sounds are normal with regular rate and rhythm and no murmurs, rubs or gallops. Peripheral pulses are normal and equal bilaterally without edema. No aortic or femoral bruits. Chest: symmetric with normal excursions and percussion.  Abdomen: Soft, with Nl bowel sounds. Nontender, no guarding,  rebound, hernias, masses, or organomegaly.  Lymphatics: Non tender without lymphadenopathy.  Genitourinary: Testes nl. DRE - deferred for age Musculoskeletal: Full ROM all peripheral extremities, joint stability, 5/5 strength, and normal gait. Skin: Warm and dry without rashes, lesions, cyanosis, clubbing or  ecchymosis.  Neuro: Cranial nerves intact, reflexes equal bilaterally.  Normal muscle tone, no cerebellar symptoms. Sensation intact.  Pysch: Alert and oriented X 3 with normal affect, insight and judgment appropriate.   Assessment and Plan  1. Annual Preventative/Screening Exam   2. Supine hypertension  - Urinalysis, Routine w reflex microscopic - Microalbumin / creatinine urine ratio - Magnesium  3. Hyperlipidemia, mixed  - Lipid panel  4. Diabetes mellitus type 2, diet-controlled (HCC)  - Hemoglobin A1c - Insulin, random  5. Vitamin D deficiency  - VITAMIN D 25 Hydroxy  6. CKD (chronic kidney disease), stage III ([place])  - Urnalysis, Routine w reflex microscopic - Microalbumin / creatinine urine ratio  7. Dysautonomia orthostatic hypotension syndrome (HCC)  8. Gastroesophageal reflux disease  9. S/P CABG x 4  - Lipid panel  10. Vitamin B 12 deficiency  - Vitamin B12  11. Screening for colorectal cancer  12. BPH with obstruction/lower urinary tract symptoms  - PSA  13. Prostate cancer screening  - PSA  14. Screening for ischemic heart disease  - Lipid panel  15. Former smoker  75. FHx: heart disease  17. Medication management  - Urinalysis, Routine w reflex microscopic - Microalbumin / creatinine urine ratio - Magnesium - Hemoglobin A1c - Insulin, random - VITAMIN D 25 Hydroxyl - Lipid panel      Patient was counseled in prudent diet, weight control to achieve/maintain BMI less than 25, BP monitoring, regular exercise and medications as discussed.  Discussed med effects and SE's. Routine screening labs and tests as requested with regular  follow-up as recommended. Over 40 minutes of exam, counseling, chart review and high complex critical decision making was performed

## 2018-01-29 NOTE — Patient Instructions (Signed)

## 2018-01-30 ENCOUNTER — Encounter: Payer: Self-pay | Admitting: Neurology

## 2018-01-30 ENCOUNTER — Ambulatory Visit: Payer: Medicare Other | Admitting: Internal Medicine

## 2018-01-30 ENCOUNTER — Encounter: Payer: Self-pay | Admitting: Internal Medicine

## 2018-01-30 ENCOUNTER — Ambulatory Visit: Payer: Medicare Other | Admitting: Neurology

## 2018-01-30 VITALS — BP 143/86 | HR 67 | Ht 72.0 in | Wt 198.0 lb

## 2018-01-30 VITALS — BP 124/66 | HR 72 | Temp 97.9°F | Resp 18 | Ht 72.0 in | Wt 199.6 lb

## 2018-01-30 DIAGNOSIS — I10 Essential (primary) hypertension: Secondary | ICD-10-CM | POA: Diagnosis not present

## 2018-01-30 DIAGNOSIS — E538 Deficiency of other specified B group vitamins: Secondary | ICD-10-CM

## 2018-01-30 DIAGNOSIS — K219 Gastro-esophageal reflux disease without esophagitis: Secondary | ICD-10-CM

## 2018-01-30 DIAGNOSIS — E1142 Type 2 diabetes mellitus with diabetic polyneuropathy: Secondary | ICD-10-CM

## 2018-01-30 DIAGNOSIS — E119 Type 2 diabetes mellitus without complications: Secondary | ICD-10-CM | POA: Diagnosis not present

## 2018-01-30 DIAGNOSIS — R270 Ataxia, unspecified: Secondary | ICD-10-CM

## 2018-01-30 DIAGNOSIS — N138 Other obstructive and reflux uropathy: Secondary | ICD-10-CM

## 2018-01-30 DIAGNOSIS — M5030 Other cervical disc degeneration, unspecified cervical region: Secondary | ICD-10-CM | POA: Diagnosis not present

## 2018-01-30 DIAGNOSIS — M542 Cervicalgia: Secondary | ICD-10-CM

## 2018-01-30 DIAGNOSIS — E782 Mixed hyperlipidemia: Secondary | ICD-10-CM

## 2018-01-30 DIAGNOSIS — Z79899 Other long term (current) drug therapy: Secondary | ICD-10-CM

## 2018-01-30 DIAGNOSIS — N183 Chronic kidney disease, stage 3 unspecified: Secondary | ICD-10-CM

## 2018-01-30 DIAGNOSIS — R519 Headache, unspecified: Secondary | ICD-10-CM

## 2018-01-30 DIAGNOSIS — Z1212 Encounter for screening for malignant neoplasm of rectum: Secondary | ICD-10-CM

## 2018-01-30 DIAGNOSIS — Z136 Encounter for screening for cardiovascular disorders: Secondary | ICD-10-CM | POA: Diagnosis not present

## 2018-01-30 DIAGNOSIS — E559 Vitamin D deficiency, unspecified: Secondary | ICD-10-CM

## 2018-01-30 DIAGNOSIS — Z0000 Encounter for general adult medical examination without abnormal findings: Secondary | ICD-10-CM

## 2018-01-30 DIAGNOSIS — M4802 Spinal stenosis, cervical region: Secondary | ICD-10-CM

## 2018-01-30 DIAGNOSIS — I63511 Cerebral infarction due to unspecified occlusion or stenosis of right middle cerebral artery: Secondary | ICD-10-CM

## 2018-01-30 DIAGNOSIS — G903 Multi-system degeneration of the autonomic nervous system: Secondary | ICD-10-CM

## 2018-01-30 DIAGNOSIS — G8929 Other chronic pain: Secondary | ICD-10-CM

## 2018-01-30 DIAGNOSIS — Z0001 Encounter for general adult medical examination with abnormal findings: Secondary | ICD-10-CM

## 2018-01-30 DIAGNOSIS — Z125 Encounter for screening for malignant neoplasm of prostate: Secondary | ICD-10-CM

## 2018-01-30 DIAGNOSIS — Z1211 Encounter for screening for malignant neoplasm of colon: Secondary | ICD-10-CM

## 2018-01-30 DIAGNOSIS — Z87891 Personal history of nicotine dependence: Secondary | ICD-10-CM

## 2018-01-30 DIAGNOSIS — I951 Orthostatic hypotension: Secondary | ICD-10-CM

## 2018-01-30 DIAGNOSIS — Z951 Presence of aortocoronary bypass graft: Secondary | ICD-10-CM

## 2018-01-30 DIAGNOSIS — N401 Enlarged prostate with lower urinary tract symptoms: Secondary | ICD-10-CM

## 2018-01-30 DIAGNOSIS — Z8249 Family history of ischemic heart disease and other diseases of the circulatory system: Secondary | ICD-10-CM

## 2018-01-30 DIAGNOSIS — R51 Headache: Secondary | ICD-10-CM

## 2018-01-30 MED ORDER — PREDNISONE 20 MG PO TABS: ORAL_TABLET | ORAL | 1 refills | Status: DC

## 2018-01-30 NOTE — Patient Instructions (Addendum)
- neck pain and pain in the back of the head, has severe chronic degenerative change sin the neck. Also in the setting of new changes in balance. Need MRI cervical spine.  - Pending loop recorder for afib.  - continue ASA and plavix for 3 weeks then plavix alone unless both needed per cardiology - has pending labwork B12, lipids, hgba1c - goal ldl < 70 he is intolerant to statins, recently started zetia consider Praluent or Repatha - follow up with cardiology - Physical therapy: recommended will hold off per patient - discussed afib and risk of stroke    Stroke Prevention Some health problems and behaviors may make it more likely for you to have a stroke. Below are ways to lessen your risk of having a stroke.  Be active for at least 30 minutes on most or all days.  Do not smoke. Try not to be around others who smoke.  Do not drink too much alcohol. ? Do not have more than 2 drinks a day if you are a man. ? Do not have more than 1 drink a day if you are a woman and are not pregnant.  Eat healthy foods, such as fruits and vegetables. If you were put on a specific diet, follow the diet as told.  Keep your cholesterol levels under control through diet and medicines. Look for foods that are low in saturated fat, trans fat, cholesterol, and are high in fiber.  If you have diabetes, follow all diet plans and take your medicine as told.  Ask your doctor if you need treatment to lower your blood pressure. If you have high blood pressure (hypertension), follow all diet plans and take your medicine as told by your doctor.  If you are 37-7 years old, have your blood pressure checked every 3-5 years. If you are age 32 or older, have your blood pressure checked every year.  Keep a healthy weight. Eat foods that are low in calories, salt, saturated fat, trans fat, and cholesterol.  Do not take drugs.  Avoid birth control pills, if this applies. Talk to your doctor about the risks of taking birth  control pills.  Talk to your doctor if you have sleep problems (sleep apnea).  Take all medicine as told by your doctor. ? You may be told to take aspirin or blood thinner medicine. Take this medicine as told by your doctor. ? Understand your medicine instructions.  Make sure any other conditions you have are being taken care of.  Get help right away if:  You suddenly lose feeling (you feel numb) or have weakness in your face, arm, or leg.  Your face or eyelid hangs down to one side.  You suddenly feel confused.  You have trouble talking (aphasia) or understanding what people are saying.  You suddenly have trouble seeing in one or both eyes.  You suddenly have trouble walking.  You are dizzy.  You lose your balance or your movements are clumsy (uncoordinated).  You suddenly have a very bad headache and you do not know the cause.  You have new chest pain.  Your heart feels like it is fluttering or skipping a beat (irregular heartbeat). Do not wait to see if the symptoms above go away. Get help right away. Call your local emergency services (911 in [place] Do not drive yourself to the hospital. This information is not intended to replace advice given to you by your health care provider. Make sure you discuss any questions you  with your health care provider. Document Released: 10/18/2011 Document Revised: 09/24/2015 Document Reviewed: 10/19/2012 Elsevier Interactive Patient Education  2018 [employer]  

## 2018-01-30 NOTE — Progress Notes (Signed)
[facility]    Provider:  Dr Jaynee Eagles Referring Provider: [name], MD Primary Care Physician:  [name], MD  CC:  Headaches for 2 months  HPI:  [name] is a 84 y.o. male here as requested by Dr. Melford Aase for headaches. PMHx PVD, CAD, DIabetes with complications, dysautonomia orthostatic hypotension, DDD, CKD stage 3, vitamin B12 deficiency.  He woke up with a headache 2 months ago, he has a history of "sinus headaches", wife is here and says he has had headaches for much longer than 2 months, he takes daily tylenol to help with back and pain, no ibuprofen.  Wife says frequent headaches for longer. Worsened 2 months ago. He saw the eye doctor. Esr and crp were unremarkable. Several weeks ago his balance worsened likely around the time of the stoke. His headache is behind the right ear, and in the back of the head, worse with laying down on the back of his head, throbbing, he has chronic neck pain and degenerative arthritis. No recent falls.  Reviewed notes, labs and imaging from outside physicians, which showed:   MRI brain: reviewed imaging and agree with the following 01/24/2018:  IMPRESSION: 1. Small acute infarct in the right temporal stem. 2. Moderate to severe chronic small vessel ischemic disease. 3. 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 4. Advanced atherosclerosis involving the posterior circulation including multiple severe PCA stenoses. No large vessel occlusion.  hgba1c 5.8 ldl 122 TSH nml    Review of Systems: Patient complains of symptoms per HPI as well as the following symptoms: Fatigue, blurred vision, feeling hot, feeling cold, palpitations, spinning session, trouble swallowing, memory loss, headache, difficulty swallowing, dizziness, decreased energy, disinterest in activities. Pertinent negatives and positives per HPI. All others negative.   Social History   Socioeconomic History  . Marital status:  Married    Spouse name: Not on file  . Number of children: 2  . Years of education: Not on file  . Highest education level: High school graduate  Occupational History  . Occupation: Retired  Scientific laboratory technician  . Financial resource strain: Not on file  . Food insecurity:    Worry: Not on file    Inability: Not on file  . Transportation needs:    Medical: Not on file    Non-medical: Not on file  Tobacco Use  . Smoking status: Former Smoker    Last attempt to quit: 07/16/1963    Years since quitting: 54.5  . Smokeless tobacco: Former Systems developer    Types: Chew    Quit date: 1989  Substance and Sexual Activity  . Alcohol use: No  . Drug use: No  . Sexual activity: Not Currently  Lifestyle  . Physical activity:    Days per week: Not on file    Minutes per session: Not on file  . Stress: Not on file  Relationships  . Social connections:    Talks on phone: Not on file    Gets together: Not on file    Attends religious service: Not on file    Active member of club or organization: Not on file    Attends meetings of clubs or organizations: Not on file    Relationship status: Not on file  . Intimate partner violence:    Fear of current or ex partner: Not on file    Emotionally abused: Not on file    Physically abused: Not on file    Forced sexual activity: Not on file  Other  Topics Concern  . Not on file  Social History Narrative   Lives at home with his wife Estill Bamberg   Right handed   No caffeine    Family History  Problem Relation Age of Onset  . Heart attack Father        died age 74  . Heart attack Brother        died age 1  . Anuerysm Brother        aortic  . Heart attack Sister        died age 58  . Colon cancer Sister   . Liver cancer Sister   . Diabetes Maternal Grandmother   . Colon polyps Sister        and brothers x 2     Past Medical History:  Diagnosis Date  . 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 Colon polyps   . Coronary atherosclerosis of unspecified type  of vessel, native or graft   . Diabetes ([place])   . Difficult intubation   . Esophageal reflux   . High cholesterol   . Hypertension    pt denies, he says he has a h/o hypotension. If BP up he adjusts the Florinef  . IBS (irritable bowel syndrome)   . Orthostatic hypotension    "BP has been dropping alot when I stand up for the last month or so" (02/17/2016)  . Vitamin B 12 deficiency   . Vitamin D deficiency     Past Surgical History:  Procedure Laterality Date  . BLADDER SURGERY  1969   traumatic pelvic fractures, urethral and bladder repair  . CARDIAC CATHETERIZATION N/A 07/01/2015   Procedure: Left Heart Cath and Coronary Angiography;  Surgeon: Wellington Hampshire, MD;  Location: [facility];  Service: Cardiovascular;  Laterality: N/A;  . COLON RESECTION N/A 05/17/2017   Procedure: DIAGNOSTIC LAPAROSCOPY,;  Surgeon: Leighton Ruff, MD;  Location: [facility];  Service: General;  Laterality: N/A;  . CORONARY ANGIOPLASTY WITH STENT PLACEMENT    . CORONARY ARTERY BYPASS GRAFT N/A 07/06/2015   Procedure: CORONARY ARTERY BYPASS GRAFTING (CABG)x 4   utilizing the left internal mammary artery and endoscopically harvested bilateral  sapheneous vein.;  Surgeon: Ivin Poot, MD;  Location: [facility];  Service: Open Heart Surgery;  Laterality: N/A;  . KNEE SURGERY    . TEE WITHOUT CARDIOVERSION N/A 07/06/2015   Procedure: TRANSESOPHAGEAL ECHOCARDIOGRAM (TEE);  Surgeon: Ivin Poot, MD;  Location: [facility];  Service: Open Heart Surgery;  Laterality: N/A;    Current Outpatient Medications  Medication Sig Dispense Refill  . acetaminophen (TYLENOL) 325 MG tablet Take 2 tablets (650 mg total) by mouth every 6 (six) hours as needed for mild pain or headache (or Fever >/= 101). 40 tablet 0  . aspirin EC 81 MG EC tablet Take 1 tablet (81 mg total) by mouth daily.    Marland Kitchen CALCIUM PO Take 1 tablet by mouth daily.    . Cholecalciferol (VITAMIN D PO) Take 2,000 Units by mouth 3 (three) times daily.     .  clopidogrel (PLAVIX) 75 MG tablet Take 1 tablet (75 mg total) by mouth daily. 30 tablet 11  . diclofenac sodium (VOLTAREN) 1 % GEL APPLY TOPICALLY TO RIBS, BACK THREE TIMES DAILY 300 g 2  . ezetimibe (ZETIA) 10 MG tablet Take 1 tablet (10 mg total) by mouth daily. 90 tablet 3  . fludrocortisone (FLORINEF) 0.1 MG tablet TAKE 2 TABLETS(200 MCG) BY MOUTH DAILY 180 tablet 0  .  gabapentin (NEURONTIN) 100 MG capsule TAKE 2 CAPSULE(200 MG) BY MOUTH 2 TIMES A DAY. 360 capsule 3  . glucose blood test strip Check blood sugar 1 time daily-DX-E11.9 100 each 5  . multivitamin (THERAGRAN) per tablet Take 1 tablet by mouth daily.      Marland Kitchen omeprazole (PRILOSEC) 20 MG capsule TAKE 2 CAPSULES BY MOUTH EVERY DAY FOR INDIGESTION 180 capsule 1  . polyethylene glycol (MIRALAX / GLYCOLAX) packet Take 17 g by mouth daily. (Patient taking differently: Take 17 g by mouth 2 (two) times daily. ) 28 each 1   No current facility-administered medications for this visit.     Allergies as of 01/30/2018 - Review Complete 01/30/2018  Allergen Reaction Noted  . Cymbalta [duloxetine hcl]  03/18/2013  . Keflex [cephalexin] Other (See Comments) 03/18/2013  . Simvastatin Other (See Comments) 03/18/2013  . Sudafed [pseudoephedrine]  03/18/2013    Vitals: BP (!) 143/86 (BP Location: Right Arm, Patient Position: Sitting)   Pulse 67   Ht 6' (1.829 m)   Wt 198 lb (89.8 kg)   BMI 26.85 kg/m  Last Weight:  Wt Readings from Last 1 Encounters:  01/30/18 198 lb (89.8 kg)   Last Height:   Ht Readings from Last 1 Encounters:  01/30/18 6' (1.829 m)   Physical exam: Exam: Gen: NAD, conversant, well nourised, obese, well groomed                     CV:  RRR, no MRG. No Carotid Bruits. No peripheral edema, warm, nontender Eyes: Conjunctivae clear without exudates or hemorrhage MSK:  Loss of normal cervical lordosis.    Neuro: Detailed Neurologic Exam  Speech:    Speech is normal; fluent and spontaneous with normal  comprehension.  Cognition:    The patient is oriented to person, place, and time;     recent and remote memory intact;     language fluent;     normal attention, concentration,     fund of knowledge Cranial Nerves:    The pupils are equal, round, and reactive to light. Attempted fundoscopy could not visualize due to small pupils.. Visual fields are full to finger confrontation. Extraocular movements are intact. Trigeminal sensation is intact and the muscles of mastication are normal. The face is symmetric. The palate elevates in the midline. Hearing intact. Voice is normal. Shoulder shrug is normal. The tongue has normal motion without fasciculations.   Coordination:    No dysmetria  Gait:    Imbalance on standing, using a cane, no shuffling, not ataxic  Motor Observation:    No asymmetry, no atrophy, and no involuntary movements noted. Tone:    Normal muscle tone.    Posture:    Posture is slightly stooped    Strength:    Minima LE proximal weakness otherwise strength is V/V in the upper and lower limbs.     Sensation: decreased LE distally to all modalities, +Romberg     Reflex Exam:  DTR's:    Absent lowers, hypo uppers.    Toes:    The toes are equiv bilaterally.   Clonus:    Clonus is absent.       Assessment/Plan:   84 y.o. Lovely male here as requested by Dr. Melford Aase for headaches. PMHx PVD, CAD, DIabetes with complications, dysautonomia orthostatic hypotension, DDD, CKD stage 3, vitamin B12 deficiency.  MRI of the brain showed an acute lacunar stroke likely secondary to small vessel disease due to significant vascular risk factors,  chronic advanced microvascular disease however cannot rule out embolic/afib as cause.  - neck pain and pain in the back of the head: has severe chronic degenerative changes in the neck,  Loss of normal cervical lordosis.  Also in the setting of new changes in balance. Need MRI cervical spine.  - Pending loop recorder for afib.  -  continue ASA and plavix for 3 weeks then plavix alone unless cardiology recommends otherwise - has pending labwork B12, lipids, hgba1c - goal ldl < 70 he is intolerant to statins, recently started zetia consider Praluent or Repatha - follow up with cardiology - Physical therapy: he declines, discussed fall risks - discussed afib and risk of stroke  - imbalance multifactorial: neuropathy, stroke, chronic spine degenerative disease but need MRI cervical spine to evaluate for cervical stenosis and myelopathy   Orders Placed This Encounter  Procedures  . MR CERVICAL SPINE WO CONTRAST     I had a long d/w patient about recent stroke, risk for recurrent stroke/TIAs, personally independently reviewed imaging studies and stroke evaluation results and answered questions.Continue Plavixand ASA for 3 weeks then plavix alone for secondary stroke prevention and maintain strict control of hypertension with blood pressure goal below 130/90, diabetes with hemoglobin A1c goal below 6.5% and lipids with LDL cholesterol goal below 70 mg/dL.Patient has h/o statin intolerance hence recommend new PCSK9 inhibitor like Praluent. Recommend he see her primary MD or cardiology to get prescription. I also advised the patient to eat a healthy diet with plenty of whole grains, cereals, fruits and vegetables, exercise regularly and maintain ideal body weight .    Sarina Ill, MD  [facility] [address] [place], [place] [address]  Phone [phone] Fax [phone]

## 2018-01-31 ENCOUNTER — Telehealth: Payer: Self-pay | Admitting: Neurology

## 2018-01-31 DIAGNOSIS — C44311 Basal cell carcinoma of skin of nose: Secondary | ICD-10-CM | POA: Diagnosis not present

## 2018-01-31 LAB — VITAMIN D 25 HYDROXY (VIT D DEFICIENCY, FRACTURES): Vit D, 25-Hydroxy: 91 ng/mL (ref 30–100)

## 2018-01-31 LAB — URINALYSIS, ROUTINE W REFLEX MICROSCOPIC
Bilirubin Urine: NEGATIVE
Glucose, UA: NEGATIVE
Hgb urine dipstick: NEGATIVE
Ketones, ur: NEGATIVE
Leukocytes, UA: NEGATIVE
Nitrite: NEGATIVE
Protein, ur: NEGATIVE
Specific Gravity, Urine: 1.021 (ref 1.001–1.03)
pH: 5.5 (ref 5.0–8.0)

## 2018-01-31 LAB — LIPID PANEL
Cholesterol: 160 mg/dL (ref <200)
HDL: 32 mg/dL — ABNORMAL LOW (ref >40)
LDL Cholesterol (Calc): 98 mg/dL (calc)
Non-HDL Cholesterol (Calc): 128 mg/dL (calc) (ref <130)
Total CHOL/HDL Ratio: 5 (calc) — ABNORMAL HIGH (ref <5.0)
Triglycerides: 199 mg/dL — ABNORMAL HIGH (ref <150)

## 2018-01-31 LAB — PSA: PSA: 1.9 ng/mL (ref <=4.0)

## 2018-01-31 LAB — MICROALBUMIN / CREATININE URINE RATIO
Creatinine, Urine: 156 mg/dL (ref 20–320)
Microalb Creat Ratio: 8 mcg/mg creat (ref <30)
Microalb, Ur: 1.2 mg/dL

## 2018-01-31 LAB — VITAMIN B12: Vitamin B-12: 465 pg/mL (ref 200–1100)

## 2018-01-31 LAB — HEMOGLOBIN A1C
Hgb A1c MFr Bld: 6.2 % of total Hgb — ABNORMAL HIGH (ref <5.7)
Mean Plasma Glucose: 131 (calc)
eAG (mmol/L): 7.3 (calc)

## 2018-01-31 LAB — MAGNESIUM: Magnesium: 2.3 mg/dL (ref 1.5–2.5)

## 2018-01-31 LAB — INSULIN, RANDOM: Insulin: 11.9 u[IU]/mL (ref 2.0–19.6)

## 2018-01-31 NOTE — Telephone Encounter (Signed)
[employer] Medicare order sent to GI. No auth they will reach out to the pt to schedule.  °

## 2018-02-01 ENCOUNTER — Other Ambulatory Visit: Payer: Self-pay

## 2018-02-01 ENCOUNTER — Ambulatory Visit (HOSPITAL_COMMUNITY): Payer: Medicare Other | Attending: Cardiovascular Disease

## 2018-02-01 DIAGNOSIS — I639 Cerebral infarction, unspecified: Secondary | ICD-10-CM | POA: Diagnosis not present

## 2018-02-01 DIAGNOSIS — I361 Nonrheumatic tricuspid (valve) insufficiency: Secondary | ICD-10-CM | POA: Insufficient documentation

## 2018-02-01 DIAGNOSIS — I10 Essential (primary) hypertension: Secondary | ICD-10-CM | POA: Diagnosis not present

## 2018-02-05 ENCOUNTER — Ambulatory Visit: Payer: Medicare Other | Admitting: Internal Medicine

## 2018-02-05 ENCOUNTER — Encounter: Payer: Self-pay | Admitting: Internal Medicine

## 2018-02-05 VITALS — BP 154/96 | HR 72 | Ht 72.0 in | Wt 196.6 lb

## 2018-02-05 DIAGNOSIS — I2510 Atherosclerotic heart disease of native coronary artery without angina pectoris: Secondary | ICD-10-CM

## 2018-02-05 DIAGNOSIS — I714 Abdominal aortic aneurysm, without rupture, unspecified: Secondary | ICD-10-CM

## 2018-02-05 DIAGNOSIS — I639 Cerebral infarction, unspecified: Secondary | ICD-10-CM

## 2018-02-05 NOTE — Progress Notes (Signed)
ELECTROPHYSIOLOGY CONSULT NOTE  Patient ID: [name], MRN: [id number], DOB/AGE: Jan 14, 1934 84 y.o. Admit date: (Not on file) Date of Consult: 02/06/2018  Primary Physician: [name], MD Primary Cardiologist: London Sheer     CLEBERT Frank is a 84 y.o. male who is being seen today for the evaluation of implantable loop recorder at the request of Dr [facility].    HPI [name] is a 84 y.o. male has a history of multiple strokes which have left him with chronic dizziness, evident sitting but aggravated by standing.  He has a history of palpitations and he is now referred for consideration of an implantable loop recorder to look for atrial fibrillation as a potential mechanism  He has a history of ischemic heart disease.  He underwent bypass surgery 3/17 with CABG x4. Echo EF 1/19 demonstrated normal LV function  MRI 9/19 showed a small acute infarct in the right temporal stem   Past Medical History:  Diagnosis Date  . 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 Colon polyps   . Coronary atherosclerosis of unspecified type of vessel, native or graft   . Diabetes ([place])   . Difficult intubation   . Esophageal reflux   . High cholesterol   . Hypertension    pt denies, he says he has a h/o hypotension. If BP up he adjusts the [place]  . IBS (irritable bowel syndrome)   . Orthostatic hypotension    "BP has been dropping alot when I stand up for the last month or so" (02/17/2016)  . Vitamin B 12 deficiency   . Vitamin D deficiency       Surgical History:  Past Surgical History:  Procedure Laterality Date  . BLADDER SURGERY  1969   traumatic pelvic fractures, urethral and bladder repair  . CARDIAC CATHETERIZATION N/A 07/01/2015   Procedure: Left Heart Cath and Coronary Angiography;  Surgeon: Wellington Hampshire, MD;  Location: [facility] [place];  Service: Cardiovascular;  Laterality: N/A;  . COLON RESECTION N/A 05/17/2017   Procedure: DIAGNOSTIC LAPAROSCOPY,;  Surgeon:  Leighton Ruff, MD;  Location: [facility];  Service: General;  Laterality: N/A;  . CORONARY ANGIOPLASTY WITH STENT PLACEMENT    . CORONARY ARTERY BYPASS GRAFT N/A 07/06/2015   Procedure: CORONARY ARTERY BYPASS GRAFTING (CABG)x 4   utilizing the left internal mammary artery and endoscopically harvested bilateral  sapheneous vein.;  Surgeon: Ivin Poot, MD;  Location: [place];  Service: Open Heart Surgery;  Laterality: N/A;  . KNEE SURGERY    . TEE WITHOUT CARDIOVERSION N/A 07/06/2015   Procedure: TRANSESOPHAGEAL ECHOCARDIOGRAM (TEE);  Surgeon: Ivin Poot, MD;  Location: [place];  Service: Open Heart Surgery;  Laterality: N/A;     Home Meds: Prior to Admission medications   Medication Sig Start Date End Date Taking? Authorizing Provider  acetaminophen (TYLENOL) 325 MG tablet Take 2 tablets (650 mg total) by mouth every 6 (six) hours as needed for mild pain or headache (or Fever >/= 101). 05/21/17   Barton Dubois, MD  aspirin EC 81 MG EC tablet Take 1 tablet (81 mg total) by mouth daily. 07/11/15   Gold, Wilder Glade, PA-C  CALCIUM PO Take 1 tablet by mouth daily.    [provider]  Cholecalciferol (VITAMIN D PO) Take 2,000 Units by mouth 3 (three) times daily.     [provider]  clopidogrel (PLAVIX) 75 MG tablet Take 1 tablet (75 mg total) by mouth daily. 01/24/18  01/24/19  Vicie Mutters, PA-C  diclofenac sodium (VOLTAREN) 1 % GEL APPLY TOPICALLY TO RIBS, BACK THREE TIMES DAILY 09/27/17   Meredith Staggers, MD  ezetimibe (ZETIA) 10 MG tablet Take 1 tablet (10 mg total) by mouth daily. 01/26/18 04/26/18  Lelon Perla, MD  fludrocortisone (FLORINEF) 0.1 MG tablet TAKE 2 TABLETS(200 MCG) BY MOUTH DAILY 01/23/18   [name], MD  gabapentin (NEURONTIN) 100 MG capsule TAKE 2 CAPSULE(200 MG) BY MOUTH 2 TIMES A DAY. 10/06/17   [name], MD  glucose blood test strip Check blood sugar 1 time daily-[id number] 07/07/17   [name], MD  multivitamin [facility]) per tablet Take 1  tablet by mouth daily.      [provider]  omeprazole (PRILOSEC) 20 MG capsule TAKE 2 CAPSULES BY MOUTH EVERY DAY FOR INDIGESTION 12/30/17   [name], MD  polyethylene glycol [facility] Flor[facility] Raveling) packet Take 17 g by mouth daily. Patient taking differently: Take 17 g by mouth 2 (two) times daily.  05/22/17   Barton Dubois, MD  predniSONE (DELTASONE) 20 MG tablet 1 tab 3 x day for 3 days, then 1 tab 2 x day for 3 days, then 1 tab 1 x day for 5 days 01/30/18   [name], MD    Allergies:  Allergies  Allergen Reactions  . Cymbalta [Duloxetine Hcl]     Dizziness  . Keflex [Cephalexin] Other (See Comments)    Reaction: unknown   . Simvastatin Other (See Comments)    Reaction: unknown   . Sudafed [Pseudoephedrine]     Dizziness    Social History   Socioeconomic History  . Marital status: Married    Spouse name: Not on file  . Number of children: 2  . Years of education: Not on file  . Highest education level: High school graduate  Occupational History  . Occupation: Retired  Scientific laboratory technician  . Financial resource strain: Not on file  . Food insecurity:    Worry: Not on file    Inability: Not on file  . Transportation needs:    Medical: Not on file    Non-medical: Not on file  Tobacco Use  . Smoking status: Former Smoker    Last attempt to quit: 07/16/1963    Years since quitting: 54.6  . Smokeless tobacco: Former Systems developer    Types: Chew    Quit date: 1989  Substance and Sexual Activity  . Alcohol use: No  . Drug use: No  . Sexual activity: Not Currently  Lifestyle  . Physical activity:    Days per week: Not on file    Minutes per session: Not on file  . Stress: Not on file  Relationships  . Social connections:    Talks on phone: Not on file    Gets together: Not on file    Attends religious service: Not on file    Active member of club or organization: Not on file    Attends meetings of clubs or organizations: Not on file    Relationship status:  Not on file  . Intimate partner violence:    Fear of current or ex partner: Not on file    Emotionally abused: Not on file    Physically abused: Not on file    Forced sexual activity: Not on file  Other Topics Concern  . Not on file  Social History Narrative   Lives at home with his wife Estill Bamberg   Right handed   No caffeine  Family History  Problem Relation Age of Onset  . Heart attack Father        died age 56  . Heart attack Brother        died age 71  . Anuerysm Brother        aortic  . Heart attack Sister        died age 68  . Colon cancer Sister   . Liver cancer Sister   . Diabetes Maternal Grandmother   . Colon polyps Sister        and brothers x 2      ROS:  Please see the history of present illness.     All other systems reviewed and negative.    Physical Exam:  Blood pressure (!) 154/96, pulse 72, height 6' (1.829 m), weight 196 lb 9.6 oz (89.2 kg), SpO2 99 %. General: Well developed, well nourished male in no acute distress. Head: Normocephalic, atraumatic, sclera non-icteric, no xanthomas, nares are without discharge. EENT: normal  Lymph Nodes:  none Neck: Negative for carotid bruits. JVD not elevated. Back:without scoliosis kyphosis Lungs: Clear bilaterally to auscultation without wheezes, rales, or rhonchi. Breathing is unlabored. Heart: RRR with S1 S2.  2/6 systolic murmur . No rubs, or gallops appreciated. Abdomen: Soft, non-tender, non-distended with normoactive bowel sounds. No hepatomegaly. No rebound/guarding. No obvious abdominal masses. Msk:  Strength and tone appear normal for age. Extremities: No clubbing or cyanosis. No  edema.  Distal pedal pulses are 2+ and equal bilaterally. Skin: Warm and Dry Neuro: Alert and oriented X 3. CN III-XII intact Grossly normal sensory and motor function . Psych:  Responds to questions appropriately with a normal affect.      Labs: Cardiac Enzymes No results for input(s): CKTOTAL, CKMB, TROPONINI in the  last 72 hours. CBC Lab Results  Component Value Date   WBC 8.3 01/24/2018   HGB 13.4 01/24/2018   HCT 41.0 01/24/2018   MCV 92.6 01/24/2018   PLT 247 01/24/2018   PROTIME: No results for input(s): LABPROT, INR in the last 72 hours. Chemistry No results for input(s): NA, K, CL, CO2, BUN, CREATININE, CALCIUM, PROT, BILITOT, ALKPHOS, ALT, AST, GLUCOSE in the last 168 hours.  Invalid input(s): LABALBU Lipids Lab Results  Component Value Date   CHOL 160 01/30/2018   HDL 32 (L) 01/30/2018   LDLCALC 98 01/30/2018   TRIG 199 (H) 01/30/2018   BNP No results found for: PROBNP Thyroid Function Tests: No results for input(s): TSH, T4TOTAL, T3FREE, THYROIDAB in the last 72 hours.  Invalid input(s): FREET3 Miscellaneous No results found for: DDIMER  Radiology/Studies:  Mr Brain Wo Contrast  Result Date: 01/24/2018 CLINICAL DATA:  New vertigo, headache, nystagmus, visual changes, and unsteady gait. EXAM: MRI HEAD WITHOUT CONTRAST MRA HEAD WITHOUT CONTRAST TECHNIQUE: Multiplanar, multiecho pulse sequences of the brain and surrounding structures were obtained without intravenous contrast. Angiographic images of the head were obtained using MRA technique without contrast. COMPARISON:  Head CT 02/17/2016 FINDINGS: MRI HEAD FINDINGS Brain: An acute infarct measuring approximately 1 cm is present in the white matter of the right temporal stem. Patchy to confluent T2 hyperintensities elsewhere in the cerebral white matter bilaterally and in the pons are nonspecific but compatible with moderate to severe chronic small vessel ischemic disease. Chronic lacunar infarcts are again noted in the bilateral basal ganglia and right external capsule. There is mild cerebral atrophy. Small chronic infarcts are noted in the cerebellum bilaterally. A single focus of chronic microhemorrhage is noted in  the right periatrial white matter. Vascular: Major intracranial vascular flow voids are preserved. Skull and upper  cervical spine: Unremarkable bone marrow signal. Sinuses/Orbits: Bilateral cataract extraction. Mild posterior ethmoid air cell mucosal thickening. No significant mastoid fluid. Other: None. MRA HEAD FINDINGS The visualized distal vertebral arteries are widely patent to the basilar with the left being mildly dominant. The right PICA is patent with moderate to severe multifocal narrowing. The left PICA is small with diminished signal which may reflect superimposed atherosclerosis and reduced flow. Grossly patent AICAs and SCAs are seen bilaterally, and there is a severe proximal right SCA stenosis. The basilar artery is widely patent. Posterior communicating arteries are not identified and may be diminutive or absent. There are multifocal severe P1, P2, and more distal PCA branch vessel stenoses bilaterally. The internal carotid arteries are widely patent from skull base to carotid termini. ACAs and MCAs are patent without evidence of proximal branch occlusion or significant proximal stenosis. No aneurysm is identified. IMPRESSION: 1. Small acute infarct in the right temporal stem. 2. Moderate to severe chronic small vessel ischemic disease. 3. 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 4. Advanced atherosclerosis involving the posterior circulation including multiple severe PCA stenoses. No large vessel occlusion. These results will be called to the ordering clinician or representative by the Radiology Department at the imaging location. Electronically Signed   By: Logan Bores M.D.   On: 01/24/2018 13:59   Mr Jodene Nam Head Wo Contrast  Result Date: 01/24/2018 CLINICAL DATA:  New vertigo, headache, nystagmus, visual changes, and unsteady gait. EXAM: MRI HEAD WITHOUT CONTRAST MRA HEAD WITHOUT CONTRAST TECHNIQUE: Multiplanar, multiecho pulse sequences of the brain and surrounding structures were obtained without intravenous contrast. Angiographic images of the head were obtained using MRA technique  without contrast. COMPARISON:  Head CT 02/17/2016 FINDINGS: MRI HEAD FINDINGS Brain: An acute infarct measuring approximately 1 cm is present in the white matter of the right temporal stem. Patchy to confluent T2 hyperintensities elsewhere in the cerebral white matter bilaterally and in the pons are nonspecific but compatible with moderate to severe chronic small vessel ischemic disease. Chronic lacunar infarcts are again noted in the bilateral basal ganglia and right external capsule. There is mild cerebral atrophy. Small chronic infarcts are noted in the cerebellum bilaterally. A single focus of chronic microhemorrhage is noted in the right periatrial white matter. Vascular: Major intracranial vascular flow voids are preserved. Skull and upper cervical spine: Unremarkable bone marrow signal. Sinuses/Orbits: Bilateral cataract extraction. Mild posterior ethmoid air cell mucosal thickening. No significant mastoid fluid. Other: None. MRA HEAD FINDINGS The visualized distal vertebral arteries are widely patent to the basilar with the left being mildly dominant. The right PICA is patent with moderate to severe multifocal narrowing. The left PICA is small with diminished signal which may reflect superimposed atherosclerosis and reduced flow. Grossly patent AICAs and SCAs are seen bilaterally, and there is a severe proximal right SCA stenosis. The basilar artery is widely patent. Posterior communicating arteries are not identified and may be diminutive or absent. There are multifocal severe P1, P2, and more distal PCA branch vessel stenoses bilaterally. The internal carotid arteries are widely patent from skull base to carotid termini. ACAs and MCAs are patent without evidence of proximal branch occlusion or significant proximal stenosis. No aneurysm is identified. IMPRESSION: 1. Small acute infarct in the right temporal stem. 2. Moderate to severe chronic small vessel ischemic disease. 3. Chronic bilateral basal ganglia  and cerebellar infarcts. 4. Advanced atherosclerosis involving the  posterior circulation including multiple severe PCA stenoses. No large vessel occlusion. These results will be called to the ordering clinician or representative by the Radiology Department at the imaging location. Electronically Signed   By: Logan Bores M.D.   On: 01/24/2018 13:59    EKG:  Assessment and Plan:  PVC   Cardiomyopathy resolved  Stroke-recurrent  Coronary artery disease with prior bypass; normal LV function  The patient has recurrent strokes in the context of palpitations and no determined mechanism.  It is reasonable I think to undertake implantable loop recorder insertion to look for atrial fibrillation as a mechanism  Have reviewed with him the benefits and risks.  He is interested in proceeding.     [name]

## 2018-02-05 NOTE — H&P (View-Only) (Signed)
ELECTROPHYSIOLOGY CONSULT NOTE  Patient ID: [name], MRN: [id number], DOB/AGE: [id number] 82 y.o. Admit date: (Not on file) Date of Consult: 02/06/2018  Primary Physician: [name], MD Primary Cardiologist: London Sheer     [name] is a 84 y.o. male who is being seen today for the evaluation of implantable loop recorder at the request of Dr [facility].    HPI [name] is a 84 y.o. male has a history of multiple strokes which have left him with chronic dizziness, evident sitting but aggravated by standing.  He has a history of palpitations and he is now referred for consideration of an implantable loop recorder to look for atrial fibrillation as a potential mechanism  He has a history of ischemic heart disease.  He underwent bypass surgery 3/17 with CABG x4. Echo EF 1/19 demonstrated normal LV function  MRI 9/19 showed a small acute infarct in the right temporal stem   Past Medical History:  Diagnosis Date  . 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 Colon polyps   . Coronary atherosclerosis of unspecified type of vessel, native or graft   . Diabetes ([place])   . Difficult intubation   . Esophageal reflux   . High cholesterol   . Hypertension    pt denies, he says he has a h/o hypotension. If BP up he adjusts the Florinef  . IBS (irritable bowel syndrome)   . Orthostatic hypotension    "BP has been dropping alot when I stand up for the last month or so" (02/17/2016)  . Vitamin B 12 deficiency   . Vitamin D deficiency       Surgical History:  Past Surgical History:  Procedure Laterality Date  . BLADDER SURGERY  1969   traumatic pelvic fractures, urethral and bladder repair  . CARDIAC CATHETERIZATION N/A 07/01/2015   Procedure: Left Heart Cath and Coronary Angiography;  Surgeon: Wellington Hampshire, MD;  Location: [place] [facility];  Service: Cardiovascular;  Laterality: N/A;  . COLON RESECTION N/A 05/17/2017   Procedure: DIAGNOSTIC LAPAROSCOPY,;  Surgeon:  Leighton Ruff, MD;  Location: [facility];  Service: General;  Laterality: N/A;  . CORONARY ANGIOPLASTY WITH STENT PLACEMENT    . CORONARY ARTERY BYPASS GRAFT N/A 07/06/2015   Procedure: CORONARY ARTERY BYPASS GRAFTING (CABG)x 4   utilizing the left internal mammary artery and endoscopically harvested bilateral  sapheneous vein.;  Surgeon: Ivin Poot, MD;  Location: [place];  Service: Open Heart Surgery;  Laterality: N/A;  . KNEE SURGERY    . TEE WITHOUT CARDIOVERSION N/A 07/06/2015   Procedure: TRANSESOPHAGEAL ECHOCARDIOGRAM (TEE);  Surgeon: Ivin Poot, MD;  Location: [place];  Service: Open Heart Surgery;  Laterality: N/A;     Home Meds: Prior to Admission medications   Medication Sig Start Date End Date Taking? Authorizing Provider  acetaminophen (TYLENOL) 325 MG tablet Take 2 tablets (650 mg total) by mouth every 6 (six) hours as needed for mild pain or headache (or Fever >/= 101). 05/21/17   Barton Dubois, MD  aspirin EC 81 MG EC tablet Take 1 tablet (81 mg total) by mouth daily. 07/11/15   Gold, Wilder Glade, PA-C  CALCIUM PO Take 1 tablet by mouth daily.    [provider]  Cholecalciferol (VITAMIN D PO) Take 2,000 Units by mouth 3 (three) times daily.     [provider]  clopidogrel (PLAVIX) 75 MG tablet Take 1 tablet (75 mg total) by mouth daily. 01/24/18  01/24/19  Vicie Mutters, PA-C  diclofenac sodium (VOLTAREN) 1 % GEL APPLY TOPICALLY TO RIBS, BACK THREE TIMES DAILY 09/27/17   Meredith Staggers, MD  ezetimibe (ZETIA) 10 MG tablet Take 1 tablet (10 mg total) by mouth daily. 01/26/18 04/26/18  Lelon Perla, MD  fludrocortisone (FLORINEF) 0.1 MG tablet TAKE 2 TABLETS(200 MCG) BY MOUTH DAILY 01/23/18   [name], MD  gabapentin (NEURONTIN) 100 MG capsule TAKE 2 CAPSULE(200 MG) BY MOUTH 2 TIMES A DAY. 10/06/17   [name], MD  glucose blood test strip Check blood sugar 1 time daily-[id number] 07/07/17   [name], MD  multivitamin [facility]) per tablet Take 1  tablet by mouth daily.      [provider]  omeprazole (PRILOSEC) 20 MG capsule TAKE 2 CAPSULES BY MOUTH EVERY DAY FOR INDIGESTION 12/30/17   [name], MD  polyethylene glycol [facility] Flor[facility]) packet Take 17 g by mouth daily. Patient taking differently: Take 17 g by mouth 2 (two) times daily.  05/22/17   Barton Dubois, MD  predniSONE (DELTASONE) 20 MG tablet 1 tab 3 x day for 3 days, then 1 tab 2 x day for 3 days, then 1 tab 1 x day for 5 days 01/30/18   [name], MD    Allergies:  Allergies  Allergen Reactions  . Cymbalta [Duloxetine Hcl]     Dizziness  . Keflex [Cephalexin] Other (See Comments)    Reaction: unknown   . Simvastatin Other (See Comments)    Reaction: unknown   . Sudafed [Pseudoephedrine]     Dizziness    Social History   Socioeconomic History  . Marital status: Married    Spouse name: Not on file  . Number of children: 2  . Years of education: Not on file  . Highest education level: High school graduate  Occupational History  . Occupation: Retired  Scientific laboratory technician  . Financial resource strain: Not on file  . Food insecurity:    Worry: Not on file    Inability: Not on file  . Transportation needs:    Medical: Not on file    Non-medical: Not on file  Tobacco Use  . Smoking status: Former Smoker    Last attempt to quit: 07/16/1963    Years since quitting: 54.6  . Smokeless tobacco: Former Systems developer    Types: Chew    Quit date: 1989  Substance and Sexual Activity  . Alcohol use: No  . Drug use: No  . Sexual activity: Not Currently  Lifestyle  . Physical activity:    Days per week: Not on file    Minutes per session: Not on file  . Stress: Not on file  Relationships  . Social connections:    Talks on phone: Not on file    Gets together: Not on file    Attends religious service: Not on file    Active member of club or organization: Not on file    Attends meetings of clubs or organizations: Not on file    Relationship status:  Not on file  . Intimate partner violence:    Fear of current or ex partner: Not on file    Emotionally abused: Not on file    Physically abused: Not on file    Forced sexual activity: Not on file  Other Topics Concern  . Not on file  Social History Narrative   Lives at home with his wife Estill Bamberg   Right handed   No caffeine  Family History  Problem Relation Age of Onset  . Heart attack Father        died age 48  . Heart attack Brother        died age 27  . Anuerysm Brother        aortic  . Heart attack Sister        died age 27  . Colon cancer Sister   . Liver cancer Sister   . Diabetes Maternal Grandmother   . Colon polyps Sister        and brothers x 2      ROS:  Please see the history of present illness.     All other systems reviewed and negative.    Physical Exam:  Blood pressure (!) 154/96, pulse 72, height 6' (1.829 m), weight 196 lb 9.6 oz (89.2 kg), SpO2 99 %. General: Well developed, well nourished male in no acute distress. Head: Normocephalic, atraumatic, sclera non-icteric, no xanthomas, nares are without discharge. EENT: normal  Lymph Nodes:  none Neck: Negative for carotid bruits. JVD not elevated. Back:without scoliosis kyphosis Lungs: Clear bilaterally to auscultation without wheezes, rales, or rhonchi. Breathing is unlabored. Heart: RRR with S1 S2.  2/6 systolic murmur . No rubs, or gallops appreciated. Abdomen: Soft, non-tender, non-distended with normoactive bowel sounds. No hepatomegaly. No rebound/guarding. No obvious abdominal masses. Msk:  Strength and tone appear normal for age. Extremities: No clubbing or cyanosis. No  edema.  Distal pedal pulses are 2+ and equal bilaterally. Skin: Warm and Dry Neuro: Alert and oriented X 3. CN III-XII intact Grossly normal sensory and motor function . Psych:  Responds to questions appropriately with a normal affect.      Labs: Cardiac Enzymes No results for input(s): CKTOTAL, CKMB, TROPONINI in the  last 72 hours. CBC Lab Results  Component Value Date   WBC 8.3 01/24/2018   HGB 13.4 01/24/2018   HCT 41.0 01/24/2018   MCV 92.6 01/24/2018   PLT 247 01/24/2018   PROTIME: No results for input(s): LABPROT, INR in the last 72 hours. Chemistry No results for input(s): NA, K, CL, CO2, BUN, CREATININE, CALCIUM, PROT, BILITOT, ALKPHOS, ALT, AST, GLUCOSE in the last 168 hours.  Invalid input(s): LABALBU Lipids Lab Results  Component Value Date   CHOL 160 01/30/2018   HDL 32 (L) 01/30/2018   LDLCALC 98 01/30/2018   TRIG 199 (H) 01/30/2018   BNP No results found for: PROBNP Thyroid Function Tests: No results for input(s): TSH, T4TOTAL, T3FREE, THYROIDAB in the last 72 hours.  Invalid input(s): FREET3 Miscellaneous No results found for: DDIMER  Radiology/Studies:  Mr Brain Wo Contrast  Result Date: 01/24/2018 CLINICAL DATA:  New vertigo, headache, nystagmus, visual changes, and unsteady gait. EXAM: MRI HEAD WITHOUT CONTRAST MRA HEAD WITHOUT CONTRAST TECHNIQUE: Multiplanar, multiecho pulse sequences of the brain and surrounding structures were obtained without intravenous contrast. Angiographic images of the head were obtained using MRA technique without contrast. COMPARISON:  Head CT 02/17/2016 FINDINGS: MRI HEAD FINDINGS Brain: An acute infarct measuring approximately 1 cm is present in the white matter of the right temporal stem. Patchy to confluent T2 hyperintensities elsewhere in the cerebral white matter bilaterally and in the pons are nonspecific but compatible with moderate to severe chronic small vessel ischemic disease. Chronic lacunar infarcts are again noted in the bilateral basal ganglia and right external capsule. There is mild cerebral atrophy. Small chronic infarcts are noted in the cerebellum bilaterally. A single focus of chronic microhemorrhage is noted in  the right periatrial white matter. Vascular: Major intracranial vascular flow voids are preserved. Skull and upper  cervical spine: Unremarkable bone marrow signal. Sinuses/Orbits: Bilateral cataract extraction. Mild posterior ethmoid air cell mucosal thickening. No significant mastoid fluid. Other: None. MRA HEAD FINDINGS The visualized distal vertebral arteries are widely patent to the basilar with the left being mildly dominant. The right PICA is patent with moderate to severe multifocal narrowing. The left PICA is small with diminished signal which may reflect superimposed atherosclerosis and reduced flow. Grossly patent AICAs and SCAs are seen bilaterally, and there is a severe proximal right SCA stenosis. The basilar artery is widely patent. Posterior communicating arteries are not identified and may be diminutive or absent. There are multifocal severe P1, P2, and more distal PCA branch vessel stenoses bilaterally. The internal carotid arteries are widely patent from skull base to carotid termini. ACAs and MCAs are patent without evidence of proximal branch occlusion or significant proximal stenosis. No aneurysm is identified. IMPRESSION: 1. Small acute infarct in the right temporal stem. 2. Moderate to severe chronic small vessel ischemic disease. 3. 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 4. Advanced atherosclerosis involving the posterior circulation including multiple severe PCA stenoses. No large vessel occlusion. These results will be called to the ordering clinician or representative by the Radiology Department at the imaging location. Electronically Signed   By: Logan Bores M.D.   On: 01/24/2018 13:59   Mr [name] Head Wo Contrast  Result Date: 01/24/2018 CLINICAL DATA:  New vertigo, headache, nystagmus, visual changes, and unsteady gait. EXAM: MRI HEAD WITHOUT CONTRAST MRA HEAD WITHOUT CONTRAST TECHNIQUE: Multiplanar, multiecho pulse sequences of the brain and surrounding structures were obtained without intravenous contrast. Angiographic images of the head were obtained using MRA technique  without contrast. COMPARISON:  Head CT 02/17/2016 FINDINGS: MRI HEAD FINDINGS Brain: An acute infarct measuring approximately 1 cm is present in the white matter of the right temporal stem. Patchy to confluent T2 hyperintensities elsewhere in the cerebral white matter bilaterally and in the pons are nonspecific but compatible with moderate to severe chronic small vessel ischemic disease. Chronic lacunar infarcts are again noted in the bilateral basal ganglia and right external capsule. There is mild cerebral atrophy. Small chronic infarcts are noted in the cerebellum bilaterally. A single focus of chronic microhemorrhage is noted in the right periatrial white matter. Vascular: Major intracranial vascular flow voids are preserved. Skull and upper cervical spine: Unremarkable bone marrow signal. Sinuses/Orbits: Bilateral cataract extraction. Mild posterior ethmoid air cell mucosal thickening. No significant mastoid fluid. Other: None. MRA HEAD FINDINGS The visualized distal vertebral arteries are widely patent to the basilar with the left being mildly dominant. The right PICA is patent with moderate to severe multifocal narrowing. The left PICA is small with diminished signal which may reflect superimposed atherosclerosis and reduced flow. Grossly patent AICAs and SCAs are seen bilaterally, and there is a severe proximal right SCA stenosis. The basilar artery is widely patent. Posterior communicating arteries are not identified and may be diminutive or absent. There are multifocal severe P1, P2, and more distal PCA branch vessel stenoses bilaterally. The internal carotid arteries are widely patent from skull base to carotid termini. ACAs and MCAs are patent without evidence of proximal branch occlusion or significant proximal stenosis. No aneurysm is identified. IMPRESSION: 1. Small acute infarct in the right temporal stem. 2. Moderate to severe chronic small vessel ischemic disease. 3. Chronic bilateral basal ganglia  and cerebellar infarcts. 4. Advanced atherosclerosis involving the  posterior circulation including multiple severe PCA stenoses. No large vessel occlusion. These results will be called to the ordering clinician or representative by the Radiology Department at the imaging location. Electronically Signed   By: Logan Bores M.D.   On: 01/24/2018 13:59    EKG:  Assessment and Plan:  PVC   Cardiomyopathy resolved  Stroke-recurrent  Coronary artery disease with prior bypass; normal LV function  The patient has recurrent strokes in the context of palpitations and no determined mechanism.  It is reasonable I think to undertake implantable loop recorder insertion to look for atrial fibrillation as a mechanism  Have reviewed with him the benefits and risks.  He is interested in proceeding.     [name]

## 2018-02-05 NOTE — Patient Instructions (Signed)
Medication Instructions:  Your physician recommends that you continue on your current medications as directed. Please refer to the Current Medication list given to you today.  If you need a refill on your cardiac medications before your next appointment, please call your pharmacy.   Lab work: None ordered.  Testing/Procedures: You physician has recommended you have a linq recorder implant.   Follow-Up: At [facility], you and your health needs are our priority.  As part of our continuing mission to provide you with exceptional heart care, we have created designated Provider Care Teams.  These Care Teams include your primary Cardiologist (physician) and Advanced Practice Providers (APPs -  Physician Assistants and Nurse Practitioners) who all work together to provide you with the care you need, when you need it.  You will need a follow up appointment in as needed with Dr Caryl Comes.   Please schedule an appointment with the device clinic 10-14 days after 10/21 for a wound and device check.   Any Other Special Instructions Will Be Listed Below (If Applicable).  Please arrive at the [facility] main entrance of [facility] hospital at: 6:30am Proceed to Patient Registration You may eat and drink prior to your procedure You may take your normal medications prior to your procedure. Please bring a list of your medications and your insurance information with you.

## 2018-02-07 ENCOUNTER — Ambulatory Visit (HOSPITAL_COMMUNITY)
Admission: RE | Admit: 2018-02-07 | Discharge: 2018-02-07 | Disposition: A | Discharge status: Home / Self Care | Payer: Medicare Other | Source: Ambulatory Visit | Attending: Internal Medicine | Admitting: Internal Medicine

## 2018-02-07 DIAGNOSIS — I714 Abdominal aortic aneurysm, without rupture, unspecified: Secondary | ICD-10-CM

## 2018-02-09 ENCOUNTER — Other Ambulatory Visit: Payer: Self-pay | Admitting: *Deleted

## 2018-02-09 MED ORDER — DICLOFENAC SODIUM 1 % TD GEL: TRANSDERMAL | 2 refills | Status: DC

## 2018-02-12 ENCOUNTER — Telehealth: Payer: Self-pay | Admitting: Neurology

## 2018-02-12 ENCOUNTER — Ambulatory Visit: Payer: Medicare Other | Admitting: Adult Health

## 2018-02-12 ENCOUNTER — Other Ambulatory Visit: Payer: Self-pay | Admitting: Neurology

## 2018-02-12 ENCOUNTER — Telehealth: Payer: Self-pay | Admitting: Cardiology

## 2018-02-12 ENCOUNTER — Other Ambulatory Visit: Payer: Self-pay | Admitting: *Deleted

## 2018-02-12 ENCOUNTER — Encounter: Payer: Self-pay | Admitting: Adult Health

## 2018-02-12 VITALS — BP 120/60 | HR 67 | Temp 96.4°F | Ht 72.0 in | Wt 195.0 lb

## 2018-02-12 DIAGNOSIS — R5383 Other fatigue: Secondary | ICD-10-CM

## 2018-02-12 DIAGNOSIS — Z8673 Personal history of transient ischemic attack (TIA), and cerebral infarction without residual deficits: Secondary | ICD-10-CM | POA: Diagnosis not present

## 2018-02-12 DIAGNOSIS — R42 Dizziness and giddiness: Secondary | ICD-10-CM | POA: Insufficient documentation

## 2018-02-12 DIAGNOSIS — I714 Abdominal aortic aneurysm, without rupture, unspecified: Secondary | ICD-10-CM

## 2018-02-12 DIAGNOSIS — G3281 Cerebellar ataxia in diseases classified elsewhere: Secondary | ICD-10-CM

## 2018-02-12 NOTE — Telephone Encounter (Signed)
Spoke with pt wife. She sts that the pt developed vision changes and weakness in the legs, shakiness on Fri 02/09/18. Pt related his symptoms to Zetia and has not taken it since Fri. Pt is feeling a bit better but he is still having some dizziness today. Adv pt wife that Zetia 1/2 life is around 24 hours and the medication should be out of his system. Adv her that I do not think the pt symptoms are related to Zetia. Adv her that he could reamain off Zetia the remaining of the week. He should resume if symptoms are still present, which confirm that his symptoms are not related to the med. Adv her to have hin f/u with neuro asap. Pt was seen today by his pcp office, labs were ordered. Adv pt wife that she should contact the pt neurologist to update them on his symptoms. Pt wife should contact our office towards the end of the week to give [place] an update.  Pt wife sts that she will contact neurology now. She verbalized understanding to the instruction given and voiced appreciation for the assistance.

## 2018-02-12 NOTE — Telephone Encounter (Signed)
I ordered an MRI of the cervical spine that was never completed, Raquel Sarna can you give me an update on that? I am also going to place an MRI of the brain given his new/worsening symptoms and maybe he can have it done with the mri cervical spine. We will review those images and call him back but if he is having acute symptoms needs to go to ED bc it takes time for these to be scheduled oupatient. Why did he not comply with my orders for MRI cervical spine that I placed 2 weeks ago? No appointment until we get the imaging back, we need those for further evaluation (which is why I ordered the MRI cervical spine 2 weeks ago). thanks

## 2018-02-12 NOTE — Telephone Encounter (Signed)
I ordered an MRI of the cervical spine that was never completed, Raquel Sarna can you give me an update on that? I am also going to place an MRI of the brain given his new symptoms. We will review those images and call him back but if he is having acute symptoms needs to go to ED bc it takes time for these to be shceduled. Why did he not comply with my orders for MRI cervical spine? thanks

## 2018-02-12 NOTE — Telephone Encounter (Signed)
Pts wife Evelyn(on DPR) requesting a call stating that since 10/11 the pt has becoming exteremly weak, dizzy, very shaky, also states that his vision is blurry. Please advise

## 2018-02-12 NOTE — Progress Notes (Signed)
Assessment and Plan:   [name] was seen today for dizziness and fatigue.  Diagnoses and all orders for this visit:  Dizziness/ imbalance/ history of lacunar CVA Ongoing symptoms worse in last 3-4 days; no acute changes or new symptoms; has been referred to Dr. Jaynee Eagles for known recent acute lacunar stroke, pending loop recorder by Dr. Caryl Comes Dizziness/imbalance unchanged from documented PE on 01/24/2018 Per Dr. Jaynee Eagles - imbalance multifactorial: neuropathy, stroke, chronic spine degenerative disease but need MRI cervical spine to evaluate for cervical stenosis and myelopathy, Actively being worked up Will check basic labs to r/o smolding infection, anemia, metabolic changes contributing, but recommended to patient to follow up with cardiology and neurology to discuss worsening symptoms - present to ED for any acute changes, severe headache, falls -     CBC with Differential/Platelet -     Comprehensive metabolic panel -     Urinalysis w microscopic + reflex cultur -     Orthostatic vital signs  Further disposition pending results of labs. Discussed med's effects and SE's.   Over 30 minutes of exam, counseling, chart review, and critical decision making was performed.   Future Appointments  Date Time Provider [place]  02/12/2018 11:30 AM Liane Comber, NP [facility] None  02/14/2018  9:40 AM Meredith Staggers, MD [facility] CPR  03/05/2018  2:00 PM [facility] DEVICE 1 [facility]  05/09/2018 11:15 AM Liane Comber, NP [facility] None  05/25/2018  9:20 AM Lelon Perla, MD [facility]  06/04/2018  1:45 PM Venancio Poisson, NP [facility] None  08/09/2018 10:30 AM [name], MD [facility] None  02/20/2019  2:00 PM [name], MD [facility] None    ------------------------------------------------------------------------------------------------------------------   HPI BP 120/60   Pulse 67   Temp (!) 96.4 F (35.8 C)   Ht 6' (1.829 m)   Wt 195  lb (88.5 kg)   SpO2 99%   BMI 26.45 kg/m   84 y.o.male with hx of htn, hyperlipidemia, ASCAD/CABG, prediabetes presents for evaluation of progressing fatigue, dizziness, vision changes, feeling shaky; these symptoms have been ongoing for several months, but reportedly worse in the last 3-4 days. "I really just can't do anything anymore." He denies fever/chills, nausea/vomiting, new headache, slurred speech, confusion, chest pain/dyspnea/palpiations. He reports was having muscle aches/weakness, but stopped zetia which was recently initiated and this seems to have resolved myalgias.   MRI was ordered by PA, Silverio Lay for ongoing symptoms, and results from 01/24/2018 found a small acute Rt Temporal stem CVA and he was started on Plavix w/his LD bASA. Patient had consultation by Dr Stanford Breed and is scheduled for cardiac echo and Holter monitoring. Patient had consultation with Dr Jaynee Eagles and she recommended continuing Plavix, & strict control of  blood sugar and Lipids.   Per her recent note on 01/30/2018 - imbalance multifactorial: neuropathy, stroke, chronic spine degenerative disease but need MRI cervical spine to evaluate for cervical stenosis and myelopathy - MR cervical spine pending  MRI brain: reviewed imaging 01/24/2018: IMPRESSION: 1. Small acute infarct in the right temporal stem. 2. Moderate to severe chronic small vessel ischemic disease. 3. 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 4. Advanced atherosclerosis involving the posterior circulation including multiple severe PCA stenoses. No large vessel occlusion.  Past Medical History:  Diagnosis Date  . 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 Colon polyps   . Coronary atherosclerosis of unspecified type of vessel, native or graft   . Diabetes ([place])   . Difficult intubation   . Esophageal  reflux   . High cholesterol   . Hypertension    pt denies, he says he has a h/o hypotension. If BP up he adjusts the Florinef  . IBS  (irritable bowel syndrome)   . Orthostatic hypotension    "BP has been dropping alot when I stand up for the last month or so" (02/17/2016)  . Vitamin B 12 deficiency   . Vitamin D deficiency      Allergies  Allergen Reactions  . Cymbalta [Duloxetine Hcl]     Dizziness  . Keflex [Cephalexin] Other (See Comments)    Reaction: unknown   . Simvastatin Other (See Comments)    Reaction: unknown   . Sudafed [Pseudoephedrine]     Dizziness    Current Outpatient Medications on File Prior to Visit  Medication Sig  . acetaminophen (TYLENOL) 325 MG tablet Take 2 tablets (650 mg total) by mouth every 6 (six) hours as needed for mild pain or headache (or Fever >/= 101).  Marland Kitchen aspirin EC 81 MG EC tablet Take 1 tablet (81 mg total) by mouth daily.  Marland Kitchen CALCIUM PO Take 1 tablet by mouth daily.  . Cholecalciferol (VITAMIN D PO) Take 2,000 Units by mouth 3 (three) times daily.   . clopidogrel (PLAVIX) 75 MG tablet Take 1 tablet (75 mg total) by mouth daily.  . diclofenac sodium (VOLTAREN) 1 % GEL Apply 2 g topically to affected area 4 times daily  . fludrocortisone (FLORINEF) 0.1 MG tablet TAKE 2 TABLETS(200 MCG) BY MOUTH DAILY  . gabapentin (NEURONTIN) 100 MG capsule TAKE 2 CAPSULE(200 MG) BY MOUTH 2 TIMES A DAY.  Marland Kitchen glucose blood test strip Check blood sugar 1 time daily-[id number]  . multivitamin (THERAGRAN) per tablet Take 1 tablet by mouth daily.    Marland Kitchen omeprazole (PRILOSEC) 20 MG capsule TAKE 2 CAPSULES BY MOUTH EVERY DAY FOR INDIGESTION  . polyethylene glycol (MIRALAX / GLYCOLAX) packet Take 17 g by mouth daily. (Patient taking differently: Take 17 g by mouth 2 (two) times daily. )  . predniSONE (DELTASONE) 20 MG tablet 1 tab 3 x day for 3 days, then 1 tab 2 x day for 3 days, then 1 tab 1 x day for 5 days  . ezetimibe (ZETIA) 10 MG tablet Take 1 tablet (10 mg total) by mouth daily. (Patient not taking: Reported on 02/12/2018)   No current facility-administered medications on file prior to visit.      ROS: all negative except above.   Physical Exam:  BP 120/60   Pulse 67   Temp (!) 96.4 F (35.8 C)   Ht 6' (1.829 m)   Wt 195 lb (88.5 kg)   SpO2 99%   BMI 26.45 kg/m   General Appearance: Well nourished, in no apparent distress. Eyes: PERRLA, EOMs, conjunctiva no swelling or erythema Sinuses: No Frontal/maxillary tenderness ENT/Mouth: Ext aud canals clear, TMs without erythema, bulging. No erythema, swelling, or exudate on post pharynx.  Tonsils not swollen or erythematous. Hearing normal.  Neck: Supple, thyroid normal.  Respiratory: Respiratory effort normal, BS equal bilaterally without rales, rhonchi, wheezing or stridor.  Cardio: RRR with no MRGs. Brisk peripheral pulses without edema.  Abdomen: Soft, + BS.  Non tender, no guarding, rebound, hernias, masses. Lymphatics: Non tender without lymphadenopathy.  Musculoskeletal: Full ROM, symmetrical strength, unsteady gait with cane.   Skin: Warm, dry without rashes, lesions, ecchymosis.  Neuro: Cranial nerves intact. Normal muscle tone, no cerebellar symptoms. Gait is unsteady with cane, Romberg is immediate and positive, tandem gait  not tested. Sensation intact.  Psych: Awake and oriented X 3, normal affect, Insight and Judgment appropriate.     Izora Ribas, NP 11:28 AM [facility]

## 2018-02-12 NOTE — Telephone Encounter (Signed)
Spoke with Estill Bamberg. She stated that the patient saw PCP today. Labs done, orthostatic VS done, vision checked. She stated she was told to call cardiology & neurology. She stated that cardiology told her that they did not think this was s/e of the Zetia. She stated that pt is a little better today. RN informed her that she would d/w Dr. Jaynee Eagles and call her back. Educated [address] on s/s of a stroke (ex. Unilateral weakness, facial droops, numbness/tingling, speech difficulty or dysarthria, balance and dizziness trouble, vision changes, etc) and advised that if any of these develop to call 911 right away and if patient gets any worse than how he is currently. She verbalized understanding and appreciation.

## 2018-02-12 NOTE — Telephone Encounter (Signed)
  Pt c/o medication issue:  1. Name of Medication: ezetimibe (ZETIA) 10 MG tablet  2. How are you currently taking this medication (dosage and times per day)? Take 1 tablet (10 mg total) by mouth daily. Patient stopped taking on Friday 02/09/18   3. Are you having a reaction (difficulty breathing--STAT)? Shaky, vision is messed up, having trouble walking without assistance  4. What is your medication issue? Is wondering if he is having a reaction to this medicaiton. He stopped the medicine on Friday and is feeling a bit better but still having symptoms.

## 2018-02-12 NOTE — Patient Instructions (Signed)
Will check basic labs to rule out anemia, infection, electrolyte changes, infection  But likely related to recent strokes/heart - please call cardiology and neurology to notify and follow up as recommended    Dizziness Dizziness is a common problem. It is a feeling of unsteadiness or light-headedness. You may feel like you are about to faint. Dizziness can lead to injury if you stumble or fall. Anyone can become dizzy, but dizziness is more common in older adults. This condition can be caused by a number of things, including medicines, dehydration, or illness. Follow these instructions at home: Eating and drinking  Drink enough fluid to keep your urine clear or pale yellow. This helps to keep you from becoming dehydrated. Try to drink more clear fluids, such as water.  Do not drink alcohol.  Limit your caffeine intake if told to do so by your health care provider. Check ingredients and nutrition facts to see if a food or beverage contains caffeine.  Limit your salt (sodium) intake if told to do so by your health care provider. Check ingredients and nutrition facts to see if a food or beverage contains sodium. Activity  Avoid making quick movements. ? Rise slowly from chairs and steady yourself until you feel okay. ? In the morning, first sit up on the side of the bed. When you feel okay, stand slowly while you hold onto something until you know that your balance is fine.  If you need to stand in one place for a long time, move your legs often. Tighten and relax the muscles in your legs while you are standing.  Do not drive or use heavy machinery if you feel dizzy.  Avoid bending down if you feel dizzy. Place items in your home so that they are easy for you to reach without leaning over. Lifestyle  Do not use any products that contain nicotine or tobacco, such as cigarettes and e-cigarettes. If you need help quitting, ask your health care provider.  Try to reduce your stress level by  using methods such as yoga or meditation. Talk with your health care provider if you need help to manage your stress. General instructions  Watch your dizziness for any changes.  Take over-the-counter and prescription medicines only as told by your health care provider. Talk with your health care provider if you think that your dizziness is caused by a medicine that you are taking.  Tell a friend or a family member that you are feeling dizzy. If he or she notices any changes in your behavior, have this person call your health care provider.  Keep all follow-up visits as told by your health care provider. This is important. Contact a health care provider if:  Your dizziness does not go away.  Your dizziness or light-headedness gets worse.  You feel nauseous.  You have reduced hearing.  You have new symptoms.  You are unsteady on your feet or you feel like the room is spinning. Get help right away if:  You vomit or have diarrhea and are unable to eat or drink anything.  You have problems talking, walking, swallowing, or using your arms, hands, or legs.  You feel generally weak.  You are not thinking clearly or you have trouble forming sentences. It may take a friend or family member to notice this.  You have chest pain, abdominal pain, shortness of breath, or sweating.  Your vision changes.  You have any bleeding.  You have a severe headache.  You have neck  pain or a stiff neck.  You have a fever. These symptoms may represent a serious problem that is an emergency. Do not wait to see if the symptoms will go away. Get medical help right away. Call your local emergency services (911 in the [place] Do not drive yourself to the hospital. Summary  Dizziness is a feeling of unsteadiness or light-headedness. This condition can be caused by a number of things, including medicines, dehydration, or illness.  Anyone can become dizzy, but dizziness is more common in older  adults.  Drink enough fluid to keep your urine clear or pale yellow. Do not drink alcohol.  Avoid making quick movements if you feel dizzy. Monitor your dizziness for any changes. This information is not intended to replace advice given to you by your health care provider. Make sure you discuss any questions you have with your health care provider. Document Released: 10/12/2000 Document Revised: 05/21/2016 Document Reviewed: 05/21/2016 Elsevier Interactive Patient Education  [employer].

## 2018-02-13 ENCOUNTER — Other Ambulatory Visit (INDEPENDENT_AMBULATORY_CARE_PROVIDER_SITE_OTHER): Payer: Medicare Other

## 2018-02-13 DIAGNOSIS — Z1211 Encounter for screening for malignant neoplasm of colon: Secondary | ICD-10-CM

## 2018-02-13 LAB — POC HEMOCCULT BLD/STL (HOME/3-CARD/SCREEN)
Card #2 Fecal Occult Blod, POC: NEGATIVE
Card #3 Fecal Occult Blood, POC: NEGATIVE
Fecal Occult Blood, POC: NEGATIVE

## 2018-02-13 NOTE — Telephone Encounter (Signed)
[facility] has been trying to reach out to the patient to schedule. I spoke to the patient and inform him of this and for him to call [facility] back and he has their number of [phone]. I also informed him that Dr. Jaynee Eagles has order a MRI Brain & Cervical for him to have done. He is aware.

## 2018-02-13 NOTE — Telephone Encounter (Signed)
Note MRIs are both scheduled for 10/25.

## 2018-02-13 NOTE — Telephone Encounter (Addendum)
Spoke with Estill Bamberg, pt's wife. She reported that he is better today. He is still unsteady but is able to ambulate. He was using the walker all weekend but now is just using the cane. His vision is better too. She stated that he has been unsteady ever since his heart surgery but it had just gotten worse. RN advised that Dr. Jaynee Eagles wants to get the MRI brain and C-spine which are scheduled for 10/25. Advised that if patient has any acute worsening to get him to an ED right away to get checked out as it takes time to get the scans in the outpatient setting. She was encouraged to call back if needed. Estill Bamberg verbalized understanding and appreciation for the call.

## 2018-02-14 ENCOUNTER — Encounter: Payer: Self-pay | Admitting: Physical Medicine & Rehabilitation

## 2018-02-14 ENCOUNTER — Encounter: Payer: Medicare Other | Attending: Physical Medicine & Rehabilitation | Admitting: Physical Medicine & Rehabilitation

## 2018-02-14 ENCOUNTER — Telehealth: Payer: Self-pay | Admitting: Cardiology

## 2018-02-14 VITALS — BP 149/80 | HR 78 | Ht 70.0 in | Wt 195.0 lb

## 2018-02-14 DIAGNOSIS — E785 Hyperlipidemia, unspecified: Secondary | ICD-10-CM | POA: Diagnosis not present

## 2018-02-14 DIAGNOSIS — E559 Vitamin D deficiency, unspecified: Secondary | ICD-10-CM | POA: Insufficient documentation

## 2018-02-14 DIAGNOSIS — G8929 Other chronic pain: Secondary | ICD-10-CM | POA: Diagnosis not present

## 2018-02-14 DIAGNOSIS — Z955 Presence of coronary angioplasty implant and graft: Secondary | ICD-10-CM | POA: Insufficient documentation

## 2018-02-14 DIAGNOSIS — I10 Essential (primary) hypertension: Secondary | ICD-10-CM | POA: Insufficient documentation

## 2018-02-14 DIAGNOSIS — R0789 Other chest pain: Secondary | ICD-10-CM | POA: Diagnosis not present

## 2018-02-14 DIAGNOSIS — Z79899 Other long term (current) drug therapy: Secondary | ICD-10-CM | POA: Diagnosis not present

## 2018-02-14 DIAGNOSIS — M4185 Other forms of scoliosis, thoracolumbar region: Secondary | ICD-10-CM | POA: Insufficient documentation

## 2018-02-14 DIAGNOSIS — Z8249 Family history of ischemic heart disease and other diseases of the circulatory system: Secondary | ICD-10-CM | POA: Insufficient documentation

## 2018-02-14 DIAGNOSIS — M545 Low back pain: Secondary | ICD-10-CM | POA: Insufficient documentation

## 2018-02-14 DIAGNOSIS — M5416 Radiculopathy, lumbar region: Secondary | ICD-10-CM | POA: Diagnosis not present

## 2018-02-14 DIAGNOSIS — K589 Irritable bowel syndrome without diarrhea: Secondary | ICD-10-CM | POA: Insufficient documentation

## 2018-02-14 DIAGNOSIS — Z87891 Personal history of nicotine dependence: Secondary | ICD-10-CM | POA: Diagnosis not present

## 2018-02-14 DIAGNOSIS — E1142 Type 2 diabetes mellitus with diabetic polyneuropathy: Secondary | ICD-10-CM | POA: Diagnosis not present

## 2018-02-14 DIAGNOSIS — I2510 Atherosclerotic heart disease of native coronary artery without angina pectoris: Secondary | ICD-10-CM | POA: Diagnosis not present

## 2018-02-14 DIAGNOSIS — K219 Gastro-esophageal reflux disease without esophagitis: Secondary | ICD-10-CM | POA: Insufficient documentation

## 2018-02-14 DIAGNOSIS — M47816 Spondylosis without myelopathy or radiculopathy, lumbar region: Secondary | ICD-10-CM

## 2018-02-14 DIAGNOSIS — Z951 Presence of aortocoronary bypass graft: Secondary | ICD-10-CM | POA: Diagnosis not present

## 2018-02-14 DIAGNOSIS — G588 Other specified mononeuropathies: Secondary | ICD-10-CM | POA: Insufficient documentation

## 2018-02-14 NOTE — Telephone Encounter (Signed)
  Patient's wife called because patient is having catheterization on 02/19/18 by Dr Caryl Comes and wants to know if he will need to stop his Plavix.

## 2018-02-14 NOTE — Patient Instructions (Signed)
IF YOU EXPERIENCE AN INCREASE IN YOUR DIZZINESS OR WORSENING BALANCE, DISORIENTATION....YOU NEED TO GO THE EMERGENCY DEPARTMENT.    PLEASE FEEL FREE TO CALL OUR OFFICE WITH ANY PROBLEMS OR QUESTIONS ([phone])

## 2018-02-14 NOTE — Progress Notes (Signed)
Subjective:    Patient ID: [name], male    DOB: Aug 01, 1933, 84 y.o.   MRN: [id number]  HPI   [name] is here in follow up of his chronic pain. In late September he was found to have a small infarct in the right temporal stem (MRI 01/24/18). With the stroke he developed increased headaches and had problems with balance, speech which prompted the MRI. Saw neurology for headaches earlier this month. MRI of cervical spine requested but not completed. Since then patient complains of increased dizziness and weakness, blurred vision. He has a loop monitor placement scheduled for Friday. He often feels dizzy when he first gets up in the morning.  He felt that way today.  He denies any nausea or vomiting.  He denies any focal weakness.  His  He had some relief for a couple weeks with the ESI from Dr. Letta Pate. His left chest wall pain is present but tolerable.   Pain Inventory Average Pain 6 Pain Right Now 6 My pain is constant and burning  In the last 24 hours, has pain interfered with the following? General activity 2 Relation with others 6 Enjoyment of life 4 What TIME of day is your pain at its worst? morning Sleep (in general) Fair  Pain is worse with: some activites Pain improves with: rest Relief from Meds: 0  Mobility walk with assistance use a cane do you drive?  no  Function retired  Neuro/Psych No problems in this area  Prior Studies Any changes since last visit?  no  Physicians involved in your care Any changes since last visit?  no   Family History  Problem Relation Age of Onset  . Heart attack Father        died age 55  . Heart attack Brother        died age 39  . Anuerysm Brother        aortic  . Heart attack Sister        died age 71  . Colon cancer Sister   . Liver cancer Sister   . Diabetes Maternal Grandmother   . Colon polyps Sister        and brothers x 2    Social History   Socioeconomic History  . Marital status: Married    Spouse name: Not on file  . Number of children: 2  . Years of education: Not on file  . Highest education level: High school graduate  Occupational History  . Occupation: Retired  Scientific laboratory technician  . Financial resource strain: Not on file  . Food insecurity:    Worry: Not on file    Inability: Not on file  . Transportation needs:    Medical: Not on file    Non-medical: Not on file  Tobacco Use  . Smoking status: Former Smoker    Last attempt to quit: 07/16/1963    Years since quitting: 54.6  . Smokeless tobacco: Former Systems developer    Types: Chew    Quit date: 1989  Substance and Sexual Activity  . Alcohol use: No  . Drug use: No  . Sexual activity: Not Currently  Lifestyle  . Physical activity:    Days per week: Not on file    Minutes per session: Not on file  . Stress: Not on file  Relationships  . Social connections:    Talks on phone: Not on file    Gets together: Not on file    Attends religious service:  Not on file    Active member of club or organization: Not on file    Attends meetings of clubs or organizations: Not on file    Relationship status: Not on file  Other Topics Concern  . Not on file  Social History Narrative   Lives at home with his wife [name]   Right handed   No caffeine   Past Surgical History:  Procedure Laterality Date  . BLADDER SURGERY  1969   traumatic pelvic fractures, urethral and bladder repair  . CARDIAC CATHETERIZATION N/A 07/01/2015   Procedure: Left Heart Cath and Coronary Angiography;  Surgeon: Wellington Hampshire, MD;  Location: [facility];  Service: Cardiovascular;  Laterality: N/A;  . COLON RESECTION N/A 05/17/2017   Procedure: DIAGNOSTIC LAPAROSCOPY,;  Surgeon: Leighton Ruff, MD;  Location: [facility];  Service: General;  Laterality: N/A;  . CORONARY ANGIOPLASTY WITH STENT PLACEMENT    . CORONARY ARTERY BYPASS GRAFT N/A 07/06/2015   Procedure: CORONARY ARTERY BYPASS GRAFTING (CABG)x 4   utilizing the left internal mammary artery and  endoscopically harvested bilateral  sapheneous vein.;  Surgeon: Ivin Poot, MD;  Location: [facility];  Service: Open Heart Surgery;  Laterality: N/A;  . KNEE SURGERY    . TEE WITHOUT CARDIOVERSION N/A 07/06/2015   Procedure: TRANSESOPHAGEAL ECHOCARDIOGRAM (TEE);  Surgeon: Ivin Poot, MD;  Location: [facility];  Service: Open Heart Surgery;  Laterality: N/A;   Past Medical History:  Diagnosis Date  . 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 Colon polyps   . Coronary atherosclerosis of unspecified type of vessel, native or graft   . Diabetes ([place])   . Difficult intubation   . Esophageal reflux   . High cholesterol   . Hypertension    pt denies, he says he has a h/o hypotension. If BP up he adjusts the Florinef  . IBS (irritable bowel syndrome)   . Orthostatic hypotension    "BP has been dropping alot when I stand up for the last month or so" (02/17/2016)  . Vitamin B 12 deficiency   . Vitamin D deficiency    There were no vitals taken for this visit.  Opioid Risk Score:   Fall Risk Score:  `1  Depression screen PHQ 2/9  Depression screen [facility] 2/9 02/04/2018 01/18/2018 12/18/2017 09/04/2017 04/12/2017 04/04/2017 11/29/2016  Decreased Interest 0 0 0 0 0 0 0  Down, Depressed, Hopeless 0 0 0 0 0 0 0  PHQ - 2 Score 0 0 0 0 0 0 0  Altered sleeping - - - - - - -  Tired, decreased energy - - - - - - -  Change in appetite - - - - - - -  Feeling bad or failure about yourself  - - - - - - -  Trouble concentrating - - - - - - -  Moving slowly or fidgety/restless - - - - - - -  Suicidal thoughts - - - - - - -  PHQ-9 Score - - - - - - -  Difficult doing work/chores - - - - - - -  Some recent data might be hidden     Review of Systems  Constitutional: Negative.   HENT: Negative.   Eyes: Negative.   Respiratory: Negative.   Cardiovascular: Negative.   Gastrointestinal: Negative.   Endocrine: Negative.   Genitourinary: Negative.   Musculoskeletal: Positive for arthralgias, back pain, gait  problem and myalgias.  Skin: Negative.   Allergic/Immunologic: Negative.  Hematological: Negative.   Psychiatric/Behavioral: Negative.   All other systems reviewed and are negative.      Objective:   Physical Exam General: No acute distress HEENT: EOMI, oral membranes moist Cards: reg rate  Chest: normal effort Abdomen: Soft, NT, ND Skin: dry, intact Extremities: no edema Skin:Clean and intact without signs of breakdown. CABG incision along sternum clean/intact Neuro: Cognitively he is within baseline.Marland Kitchen 6-7 beats of nystagmus with right lateral gaze, none on left.  Patient needs extra time to gain balance when first moving from a sit to stand position.  Once he attained standing is able to use his cane for balance holding it to the left side for support. Dased sensation to LT bilateral lower extremities below the calves.Reflexes are 2+ in all 4's. Fine motor coordination is intact. No tremors. Motor function remains5/5.  Musculoskeletal:lumbar levoscoliosis. Pain with extension and flexion of lumbar spine. Flexion caused more pain today. Right lumbar paraspinals taut, tender,  Rib cage tender. Left antalgia Psych:pleasantand appropriate        1. Chronic right chest wall pain, intercostal neuritis, ribs 6 and 7. Likely triggered by  CABG.  2. Chronic low back pain, thoracolumbar levoscoliosis.Most recent lumbar MRI reveals critical DDD at L3-L5 and foraminal stenosis at these levels. Good results with recent right L4-5 T-LESI. Had equivocal results with medial branch blocks in the past. Presentation at this point is more consistent with a facet arthropathy however. 3. Diabetic polyneuropathy with ?autonomic dysfunction, hypotension 4. Right knee pain. Likely old meniscal injury/osteoarthritis--increased pain today 5.  Recent right temporal stem infarct.  Patient with increased dizziness and balance deficits.   Plan: 1.S/P right L4-5 ESI per Dr. Letta Pate 9/19  with transient relief.  He seems to understand more that he will have to deal with some component of pain given his multitude of problems.  I think he has expected to totally eliminate pain and realizes that that could be unrealistic.  -Now being seen for headaches by neurology.  Neurology has ordered an MRI of the cervical spine in addition to repeat MRI of the brain.  Patient has had some cervical pain in the past but has not complained about this very prominently as her sole focus has been his chest and low back.Marland Kitchen 2.Maintain gabapentin 200mg twice daily.  3. Voltaren gel and lidocaine gel to back/ribs.  Topical over the counter patches as well 4. continue HEP and sensible activities with moderation 5. Provided vestibular exercises to work on while seated which may help a bit with acclamation.  If someone is there with him he may work on these while standing as well.  However, I instructed him to go to the ED if he experiences an increases in his dizziness, worsening balance, etc that he needs to go immediately to the ED.    -loop recorder pending   -MRI of brain, cervical spine pending for next week 6. Follow up in about  3 months. 29minutes of face to face patient care time were spent during this visit. All questions were encouraged and answered.    Greater than 50% of the time was spent reviewing pertinent imaging and discussing treatment plan modalities with patient today.

## 2018-02-14 NOTE — Telephone Encounter (Signed)
[name] called. There are no medication restrictions prior to his Linq implant. [name] verbalized understanding and had no additional questions.

## 2018-02-15 ENCOUNTER — Encounter: Payer: Self-pay | Admitting: *Deleted

## 2018-02-19 ENCOUNTER — Encounter (HOSPITAL_COMMUNITY)
Admission: RE | Disposition: A | Discharge status: Home / Self Care | Payer: Self-pay | Source: Ambulatory Visit | Attending: Internal Medicine

## 2018-02-19 ENCOUNTER — Other Ambulatory Visit: Payer: Self-pay

## 2018-02-19 ENCOUNTER — Ambulatory Visit (HOSPITAL_COMMUNITY)
Admission: RE | Admit: 2018-02-19 | Discharge: 2018-02-19 | Disposition: A | Discharge status: Home / Self Care | Payer: Medicare Other | Source: Ambulatory Visit | Attending: Internal Medicine | Admitting: Internal Medicine

## 2018-02-19 ENCOUNTER — Encounter (HOSPITAL_COMMUNITY): Payer: Self-pay | Admitting: Internal Medicine

## 2018-02-19 DIAGNOSIS — Z955 Presence of coronary angioplasty implant and graft: Secondary | ICD-10-CM | POA: Diagnosis not present

## 2018-02-19 DIAGNOSIS — E119 Type 2 diabetes mellitus without complications: Secondary | ICD-10-CM | POA: Insufficient documentation

## 2018-02-19 DIAGNOSIS — I10 Essential (primary) hypertension: Secondary | ICD-10-CM | POA: Insufficient documentation

## 2018-02-19 DIAGNOSIS — Z8673 Personal history of transient ischemic attack (TIA), and cerebral infarction without residual deficits: Secondary | ICD-10-CM

## 2018-02-19 DIAGNOSIS — Z7902 Long term (current) use of antithrombotics/antiplatelets: Secondary | ICD-10-CM | POA: Insufficient documentation

## 2018-02-19 DIAGNOSIS — H5500 Unspecified nystagmus: Secondary | ICD-10-CM | POA: Diagnosis not present

## 2018-02-19 DIAGNOSIS — Z951 Presence of aortocoronary bypass graft: Secondary | ICD-10-CM | POA: Insufficient documentation

## 2018-02-19 DIAGNOSIS — I2510 Atherosclerotic heart disease of native coronary artery without angina pectoris: Secondary | ICD-10-CM | POA: Insufficient documentation

## 2018-02-19 DIAGNOSIS — Z8249 Family history of ischemic heart disease and other diseases of the circulatory system: Secondary | ICD-10-CM | POA: Insufficient documentation

## 2018-02-19 DIAGNOSIS — Z87891 Personal history of nicotine dependence: Secondary | ICD-10-CM | POA: Diagnosis not present

## 2018-02-19 DIAGNOSIS — K589 Irritable bowel syndrome without diarrhea: Secondary | ICD-10-CM | POA: Diagnosis not present

## 2018-02-19 DIAGNOSIS — K219 Gastro-esophageal reflux disease without esophagitis: Secondary | ICD-10-CM | POA: Diagnosis not present

## 2018-02-19 DIAGNOSIS — I6389 Other cerebral infarction: Secondary | ICD-10-CM | POA: Diagnosis not present

## 2018-02-19 DIAGNOSIS — I493 Ventricular premature depolarization: Secondary | ICD-10-CM | POA: Insufficient documentation

## 2018-02-19 DIAGNOSIS — E7800 Pure hypercholesterolemia, unspecified: Secondary | ICD-10-CM | POA: Insufficient documentation

## 2018-02-19 DIAGNOSIS — Z7982 Long term (current) use of aspirin: Secondary | ICD-10-CM | POA: Insufficient documentation

## 2018-02-19 HISTORY — PX: LOOP RECORDER INSERTION: EP1214

## 2018-02-19 SURGERY — LOOP RECORDER INSERTION

## 2018-02-19 MED ORDER — LIDOCAINE-EPINEPHRINE 1 %-1:100000 IJ SOLN
INTRAMUSCULAR | Status: DC | PRN
  Administered 2018-02-19: 20 mL

## 2018-02-19 MED ORDER — LIDOCAINE-EPINEPHRINE 1 %-1:100000 IJ SOLN
INTRAMUSCULAR | Status: AC
  Filled 2018-02-19: qty 1

## 2018-02-19 SURGICAL SUPPLY — 2 items
LOOP REVEAL LINQSYS (Prosthesis & Implant Heart) ×1 IMPLANT
PACK LOOP INSERTION (CUSTOM PROCEDURE TRAY) ×2 IMPLANT

## 2018-02-19 NOTE — Discharge Instructions (Signed)
Implantable Loop Recorder Placement, Care After  Refer to this sheet in the next few weeks. These instructions provide you with information about caring for yourself after your procedure. Your health care provider may also give you more specific instructions. Your treatment has been planned according to current medical practices, but problems sometimes occur. Call your health care provider if you have any problems or questions after your procedure.  What can I expect after the procedure?  After the procedure, it is common to have:   Soreness or pain near the cut from surgery (incision).   Some swelling or bruising near the incision.    Follow these instructions at home:  Medicines   Take over-the-counter and prescription medicines only as told by your health care provider.   If you were prescribed an antibiotic medicine, take it as told by your health care provider. Do not stop taking the antibiotic even if you start to feel better.  Bathing   Do not take baths, swim, or use a hot tub until your health care provider approves. Ask your health care provider if you can take showers. You may only be allowed to take sponge baths for bathing.  Incision care   Follow instructions from your health care provider about how to take care of your incision. Make sure you:  ? Wash your hands with soap and water before you change your bandage (dressing). If soap and water are not available, use hand sanitizer.  ? Change your dressing as told by your health care provider.  ? Keep your dressing dry.  ? Leave stitches (sutures), skin glue, or adhesive strips in place. These skin closures may need to stay in place for 2 weeks or longer. If adhesive strip edges start to loosen and curl up, you may trim the loose edges. Do not remove adhesive strips completely unless your health care provider tells you to do that.   Check your incision area every day for signs of infection. Check for:  ? More redness, swelling, or pain.  ? Fluid  or blood.  ? Warmth.  ? Pus or a bad smell.  Driving   If you received a sedative, do not drive for 24 hours after the procedure.   If you did not receive a sedative, ask your health care provider when it is safe to drive.  Activity   Return to your normal activities as told by your health care provider. Ask your health care provider what activities are safe for you.   Until your health care provider says it is safe:  ? Do not lift anything that is heavier than 10 lb (4.5 kg).  ? Do not do activities that involve lifting your arms over your head.  General instructions     Follow instructions from your health care provider about how and when to use your implantable loop recorder.   Do not go through a metal detection gate, and do not let someone hold a metal detector over your chest. Show your ID card.   Do not have an MRI unless you check with your health care provider first.   Do not use any tobacco products, such as cigarettes, chewing tobacco, and e-cigarettes. Tobacco can delay healing. If you need help quitting, ask your health care provider.   Keep all follow-up visits as told by your health care provider. This is important.  Contact a health care provider if:   You have more redness, swelling, or pain around your incision.     You have more fluid or blood coming from your incision.   Your incision feels warm to the touch.   You have pus or a bad smell coming from your incision.   You have a fever.   You have pain that is not relieved by your pain medicine.   You have triggered your device because of fainting (syncope) or because of a heartbeat that feels like it is racing, slow, fluttering, or skipping (palpitations).  Get help right away if:   You have chest pain.   You have difficulty breathing.  This information is not intended to replace advice given to you by your health care provider. Make sure you discuss any questions you have with your health care provider.  Document Released:  03/30/2015 Document Revised: 09/24/2015 Document Reviewed: 01/21/2015  Elsevier Interactive Patient Education  2018 [employer]

## 2018-02-19 NOTE — Interval H&P Note (Signed)
History and Physical Interval Note:  02/19/2018 8:01 AM  [name]  has presented today for surgery, with the diagnosis of syncope  The various methods of treatment have been discussed with the patient and family. After consideration of risks, benefits and other options for treatment, the patient has consented to  Procedure(s): LOOP RECORDER INSERTION (N/A) as a surgical intervention .  The patient's history has been reviewed, patient examined, no change in status, stable for surgery.  I have reviewed the patient's chart and labs.  Questions were answered to the patient's satisfaction.     [name]

## 2018-02-20 ENCOUNTER — Encounter: Payer: Self-pay | Admitting: Radiology

## 2018-02-23 ENCOUNTER — Ambulatory Visit
Admission: RE | Admit: 2018-02-23 | Discharge: 2018-02-23 | Disposition: A | Discharge status: Home / Self Care | Payer: Medicare Other | Source: Ambulatory Visit | Attending: Neurology | Admitting: Neurology

## 2018-02-23 DIAGNOSIS — M542 Cervicalgia: Secondary | ICD-10-CM

## 2018-02-23 DIAGNOSIS — R270 Ataxia, unspecified: Secondary | ICD-10-CM | POA: Diagnosis not present

## 2018-02-23 DIAGNOSIS — G8929 Other chronic pain: Secondary | ICD-10-CM

## 2018-02-23 DIAGNOSIS — M4802 Spinal stenosis, cervical region: Secondary | ICD-10-CM

## 2018-02-23 DIAGNOSIS — M5030 Other cervical disc degeneration, unspecified cervical region: Secondary | ICD-10-CM | POA: Diagnosis not present

## 2018-02-23 DIAGNOSIS — R42 Dizziness and giddiness: Secondary | ICD-10-CM | POA: Diagnosis not present

## 2018-02-23 DIAGNOSIS — G3281 Cerebellar ataxia in diseases classified elsewhere: Secondary | ICD-10-CM | POA: Diagnosis not present

## 2018-02-23 MED ORDER — GADOBENATE DIMEGLUMINE 529 MG/ML IV SOLN
18.0000 mL | Freq: Once | INTRAVENOUS | Status: AC | PRN
  Administered 2018-02-23: 18 mL via INTRAVENOUS

## 2018-02-26 ENCOUNTER — Telehealth: Payer: Self-pay

## 2018-02-26 NOTE — Telephone Encounter (Signed)
I called pt and discussed his MRI results. Pt is asking for an appt next month with Janett Billow, NP, rather than the February appt. An appt was scheduled for 03/28/18 at 8:45am. Pt verbalized understanding of results and new appt date and time.

## 2018-02-26 NOTE — Telephone Encounter (Signed)
-----   Message from Melvenia Beam, MD sent at 02/26/2018  8:02 AM EDT ----- MRI cervical spine unremarkable, some arthritis nothing significant

## 2018-02-26 NOTE — Telephone Encounter (Signed)
-----   Message from Melvenia Beam, MD sent at 02/26/2018  8:03 AM EDT ----- He has a new very small stroke on his MRI on the left and old strokes as well. Make sure he has follow up with Janett Billow within 3 months to discuss thanks

## 2018-03-05 ENCOUNTER — Ambulatory Visit (INDEPENDENT_AMBULATORY_CARE_PROVIDER_SITE_OTHER): Payer: Medicare Other | Admitting: *Deleted

## 2018-03-05 DIAGNOSIS — I639 Cerebral infarction, unspecified: Secondary | ICD-10-CM

## 2018-03-05 LAB — CUP PACEART INCLINIC DEVICE CHECK
Date Time Interrogation Session: 20191104142344
Implantable Pulse Generator Implant Date: 20191021

## 2018-03-05 NOTE — Progress Notes (Signed)
Loop wound check in clinic. Steri-strips removed, incision edges approximated, no redness, swelling or drainage. Patient and wife educated about wound care and Carelink monitoring. Battery status: good. R-waves 0.28mV. No episodes recorded. Monthly summary reports and ROV with SK PRN.

## 2018-03-16 ENCOUNTER — Telehealth: Payer: Self-pay

## 2018-03-16 NOTE — Telephone Encounter (Signed)
Please move him up if he needs to be seen. Not sure why we didn't just go ahead and do it.Marland KitchenMarland KitchenMarland Kitchen

## 2018-03-16 NOTE — Telephone Encounter (Signed)
Patient called requesting that his 05-15-2018 appointment be moved earlier if possible due to increased pain.

## 2018-03-20 ENCOUNTER — Ambulatory Visit (INDEPENDENT_AMBULATORY_CARE_PROVIDER_SITE_OTHER): Payer: Medicare Other | Admitting: Internal Medicine

## 2018-03-20 ENCOUNTER — Encounter: Payer: Self-pay | Admitting: Internal Medicine

## 2018-03-20 VITALS — BP 206/100 | HR 72 | Temp 97.4°F | Resp 16 | Ht 70.0 in | Wt 191.0 lb

## 2018-03-20 DIAGNOSIS — G903 Multi-system degeneration of the autonomic nervous system: Secondary | ICD-10-CM | POA: Diagnosis not present

## 2018-03-20 DIAGNOSIS — I951 Orthostatic hypotension: Secondary | ICD-10-CM

## 2018-03-20 DIAGNOSIS — I10 Essential (primary) hypertension: Secondary | ICD-10-CM

## 2018-03-20 NOTE — Progress Notes (Signed)
Patient ID: [name], male   DOB: [birth year], 84 y.o.   MRN: [id number]

## 2018-03-20 NOTE — Progress Notes (Signed)
Subjective:    Patient ID: [name], male    DOB: 01/10/1934, 84 y.o.   MRN: [id number]  HPI    Patient is a very nice 84 yo MWM with hx/o Dysautonomia with postural hypotension complicated by supine HTN who wad been stable on Florinef. He now presents with c/o query dizziness nor postural and not vertiginous. Today postural BP's were very elevated. He denies any HA's or discrete neuro sx's.   Medication Sig  . acetaminophen (TYLENOL) 500 MG tablet Take 1,000 mg by mouth as needed for mild pain.  Marland Kitchen aspirin EC 81 MG EC tablet Take 1 tablet (81 mg total) by mouth daily.  Marland Kitchen CALCIUM PO Take 1 tablet by mouth daily.  . Cholecalciferol (VITAMIN D-3) 5000 units TABS Take 5,000 Units by mouth daily.  . clopidogrel (PLAVIX) 75 MG tablet Take 1 tablet (75 mg total) by mouth daily. (Patient taking differently: Take 75 mg by mouth at bedtime. )  . diclofenac sodium (VOLTAREN) 1 % GEL Apply 2 g topically to affected area 4 times daily (Patient taking differently: Apply 2-4 g topically 4 (four) times daily as needed (for leg pain.). )  . fludrocortisone (FLORINEF) 0.1 MG tablet TAKE 2 TABLETS(200 MCG) BY MOUTH DAILY (Patient taking differently: Take 0.1 mg by mouth 2 (two) times daily. )  . gabapentin  100 MG capsule Take 200 mg by mouth 2 (two) times daily  . glucose blood test strip Check blood sugar 1 time daily-DX-E11.9  . Multi-Vit w/Minerals Take 1 tablet  daily.  Marland Kitchen omeprazole  20 MG capsule Take 20 mg  2  times daily. )  . polyethylene glycol  packet Take 17 g  2 times daily.   Allergies  Allergen Reactions  . Cymbalta [Duloxetine Hcl]     Dizziness  . Keflex [Cephalexin] Other (See Comments)    Reaction: unknown   . Simvastatin Other (See Comments)    Reaction: unknown   . Sudafed [Pseudoephedrine]     Dizziness   Past Medical History:  Diagnosis Date  . 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 Colon polyps   . Coronary atherosclerosis of unspecified type of vessel, native or graft    . Diabetes ([place])   . Difficult intubation   . Esophageal reflux   . High cholesterol   . Hypertension    pt denies, he says he has a h/o hypotension. If BP up he adjusts the Florinef  . IBS (irritable bowel syndrome)   . Orthostatic hypotension    "BP has been dropping alot when I stand up for the last month or so" (02/17/2016)  . Vitamin B 12 deficiency   . Vitamin D deficiency    Past Surgical History:  Procedure Laterality Date  . BLADDER SURGERY  1969   traumatic pelvic fractures, urethral and bladder repair  . CARDIAC CATHETERIZATION N/A 07/01/2015   Procedure: Left Heart Cath and Coronary Angiography;  Surgeon: Wellington Hampshire, MD;  Location: [facility];  Service: Cardiovascular;  Laterality: N/A;  . COLON RESECTION N/A 05/17/2017   Procedure: DIAGNOSTIC LAPAROSCOPY,;  Surgeon: Leighton Ruff, MD;  Location: [facility];  Service: General;  Laterality: N/A;  . CORONARY ANGIOPLASTY WITH STENT PLACEMENT    . CORONARY ARTERY BYPASS GRAFT N/A 07/06/2015   Procedure: CORONARY ARTERY BYPASS GRAFTING (CABG)x 4   utilizing the left internal mammary artery and endoscopically harvested bilateral  sapheneous vein.;  Surgeon: Ivin Poot, MD;  Location: [place];  Service:  Open Heart Surgery;  Laterality: N/A;  . KNEE SURGERY    . LOOP RECORDER INSERTION N/A 02/19/2018   Procedure: LOOP RECORDER INSERTION;  Surgeon: Deboraha Sprang, MD;  Location: [place] [facility];  Service: Cardiovascular;  Laterality: N/A;  . TEE WITHOUT CARDIOVERSION N/A 07/06/2015   Procedure: TRANSESOPHAGEAL ECHOCARDIOGRAM (TEE);  Surgeon: Ivin Poot, MD;  Location: [facility];  Service: Open Heart Surgery;  Laterality: N/A;       Review of Systems     Objective:   Physical Exam   BP (!) 206/100 Comment: 206/100-sit/168/100-standing  Pulse 72   Temp (!) 97.4 F (36.3 C)   Resp 16   Ht 5\' 10"  (1.778 m)   Wt 191 lb (86.6 kg)   BMI 27.41 kg/m   Postural Sitting BP 209/124   P 66     &    Standing BP  177/108    P 84  HEENT - WNL. Neck - supple.  Chest - Clear equal BS. Cor - Nl HS. RRR w/o sig MGR. PP 1(+). No edema. MS- FROM w/o deformities.  Gait Nl. Neuro -  Nl w/o focal abnormalities.    Assessment & Plan:   1. Accelerated hypertension  - advised stop his Florinef and monitor sitting/standing BP's / pulses 2 x /day and return in 1 week for re-evaluation - advise no driving  2. Dysautonomia / orthostatic hypotension syndrome (HCC)

## 2018-03-26 ENCOUNTER — Encounter: Payer: Self-pay | Admitting: Internal Medicine

## 2018-03-26 ENCOUNTER — Ambulatory Visit (INDEPENDENT_AMBULATORY_CARE_PROVIDER_SITE_OTHER): Payer: Medicare Other

## 2018-03-26 DIAGNOSIS — M9902 Segmental and somatic dysfunction of thoracic region: Secondary | ICD-10-CM | POA: Diagnosis not present

## 2018-03-26 DIAGNOSIS — I639 Cerebral infarction, unspecified: Secondary | ICD-10-CM

## 2018-03-26 DIAGNOSIS — M5134 Other intervertebral disc degeneration, thoracic region: Secondary | ICD-10-CM | POA: Diagnosis not present

## 2018-03-26 NOTE — Progress Notes (Signed)
Subjective:    Patient ID: [name], male    DOB: [birth year], 84 y.o.   MRN: [id number]  HPI   This very nice 84 yo MWM with Labile HTN/ Dysautonomia /postural Hypotension returns for 1 week f/u of accelerated HTN on Florinef bid with Postural Sitting BP 209/124   P 66     &    Standing BP 177/108.  He was advised to stop his Florinef and continue home monitoring of postural  BP recording. He's currently on no other vasoactive BP meds.   Outpatient Medications Prior to Visit  Medication Sig Dispense Refill  . acetaminophen (TYLENOL) 500 MG tablet Take 1,000 mg by mouth as needed for mild pain.    Marland Kitchen aspirin EC 81 MG EC tablet Take 1 tablet (81 mg total) by mouth daily.    Marland Kitchen CALCIUM PO Take 1 tablet by mouth daily.    . Cholecalciferol (VITAMIN D-3) 5000 units TABS Take 5,000 Units by mouth daily.    . clopidogrel (PLAVIX) 75 MG tablet Take 1 tablet (75 mg total) by mouth daily. (Patient taking differently: Take 75 mg by mouth at bedtime. ) 30 tablet 11  . diclofenac sodium (VOLTAREN) 1 % GEL Apply 2 g topically to affected area 4 times daily (Patient taking differently: Apply 2-4 g topically 4 (four) times daily as needed (for leg pain.). ) 300 g 2  . gabapentin (NEURONTIN) 100 MG capsule TAKE 2 CAPSULE(200 MG) BY MOUTH 2 TIMES A DAY. (Patient taking differently: Take 200 mg by mouth 2 (two) times daily. TAKE 2 CAPSULE(200 MG) BY MOUTH 2 TIMES A DAY.) 360 capsule 3  . glucose blood test strip Check blood sugar 1 time daily-DX-E11.9 100 each 5  . Multiple Vitamin (MULTIVITAMIN WITH MINERALS) TABS tablet Take 1 tablet by mouth daily.    Marland Kitchen omeprazole (PRILOSEC) 20 MG capsule TAKE 2 CAPSULES BY MOUTH EVERY DAY FOR INDIGESTION (Patient taking differently: Take 20 mg by mouth 2 (two) times daily. ) 180 capsule 1  . polyethylene glycol (MIRALAX / GLYCOLAX) packet Take 17 g by mouth daily. (Patient taking differently: Take 17 g by mouth 2 (two) times daily. ) 28 each 1  . fludrocortisone  (FLORINEF) 0.1 MG tablet TAKE 2 TABLETS(200 MCG) BY MOUTH DAILY (Patient not taking: Reported on 03/28/2018) 180 tablet 0  . predniSONE (DELTASONE) 20 MG tablet 1 tab 3 x day for 3 days, then 1 tab 2 x day for 3 days, then 1 tab 1 x day for 5 days 20 tablet 1   No facility-administered medications prior to visit.    Allergies  Allergen Reactions  . Cymbalta [Duloxetine Hcl]     Dizziness  . Keflex [Cephalexin] Other (See Comments)    Reaction: unknown   . Simvastatin Other (See Comments)    Reaction: unknown   . Sudafed [Pseudoephedrine]     Dizziness   Past Medical History:  Diagnosis Date  . 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 Colon polyps   . Coronary atherosclerosis of unspecified type of vessel, native or graft   . Diabetes ([place])   . Difficult intubation   . Esophageal reflux   . High cholesterol   . Hypertension    pt denies, he says he has a h/o hypotension. If BP up he adjusts the Florinef  . IBS (irritable bowel syndrome)   . Orthostatic hypotension    "BP has been dropping alot when I stand up for the last month or so" (  02/17/2016)  . Vitamin B 12 deficiency   . Vitamin D deficiency    Past Surgical History:  Procedure Laterality Date  . BLADDER SURGERY  1969   traumatic pelvic fractures, urethral and bladder repair  . CARDIAC CATHETERIZATION N/A 07/01/2015   Procedure: Left Heart Cath and Coronary Angiography;  Surgeon: Wellington Hampshire, MD;  Location: [facility];  Service: Cardiovascular;  Laterality: N/A;  . COLON RESECTION N/A 05/17/2017   Procedure: DIAGNOSTIC LAPAROSCOPY,;  Surgeon: Leighton Ruff, MD;  Location: [facility];  Service: General;  Laterality: N/A;  . CORONARY ANGIOPLASTY WITH STENT PLACEMENT    . CORONARY ARTERY BYPASS GRAFT N/A 07/06/2015   Procedure: CORONARY ARTERY BYPASS GRAFTING (CABG)x 4   utilizing the left internal mammary artery and endoscopically harvested bilateral  sapheneous vein.;  Surgeon: Ivin Poot, MD;  Location: [place];   Service: Open Heart Surgery;  Laterality: N/A;  . KNEE SURGERY    . LOOP RECORDER INSERTION N/A 02/19/2018   Procedure: LOOP RECORDER INSERTION;  Surgeon: Deboraha Sprang, MD;  Location: [facility];  Service: Cardiovascular;  Laterality: N/A;  . TEE WITHOUT CARDIOVERSION N/A 07/06/2015   Procedure: TRANSESOPHAGEAL ECHOCARDIOGRAM (TEE);  Surgeon: Ivin Poot, MD;  Location: [place];  Service: Open Heart Surgery;  Laterality: N/A;   Review of Systems   10 point systems review negative except as above.     Objective:   Physical Exam  BP (!) 192/100 Comment: 192/100-sitting/178/94-standing  Pulse 72 Comment: pulse 72 sitting and 84 standing  Temp (!) 97.4 F (36.3 C)   Resp 16   Ht 5\' 10"  (1.778 m)   Wt 191 lb (86.6 kg)   BMI 27.41 kg/m   HEENT - WNL. Neck - supple.  Chest - Clear equal BS. Cor - Nl HS. RRR w/o sig MGR. PP 1(+). No edema. MS- FROM w/o deformities.  Gait Nl. Neuro -  Nl w/o focal abnormalities.     Assessment & Plan:   1. Accelerated hypertension  - olmesartan (BENICAR) 40 MG tablet; Take 1/2 to 1 tablet daily for BP  Dispense: 30 tablet; Refill: 2  2. Dysautonomia orthostatic hypotension syndrome (HCC)  3. Chest wall pain  - predniSONE (DELTASONE) 20 MG tablet; 1 tab 3 x day for 5 days, then 1 tab 2 x day for 5 days, then 1 tab 1 x day for 5 days  Dispense: 30 tablet; Refill: 0

## 2018-03-26 NOTE — Patient Instructions (Addendum)
Orthostatic Hypotension Orthostatic hypotension is a sudden drop in blood pressure that happens when you quickly change positions, such as when you get up from a seated or lying position. Blood pressure is a measurement of how strongly, or weakly, your blood is pressing against the walls of your arteries. Arteries are blood vessels that carry blood from your heart throughout your body. When blood pressure is too low, you may not get enough blood to your brain or to the rest of your organs. This can cause weakness, light-headedness, rapid heartbeat, and fainting. This can last for just a few seconds or for up to a few minutes. Orthostatic hypotension is usually not a serious problem. However, if it happens frequently or gets worse, it may be a sign of something more serious. What are the causes? This condition may be caused by:  Sudden changes in posture, such as standing up quickly after you have been sitting or lying down.  Blood loss.  Loss of body fluids (dehydration).  Heart problems.  Hormone (endocrine) problems.  Pregnancy.  Severe infection.  Lack of certain nutrients.  Severe allergic reactions (anaphylaxis).  Certain medicines, such as blood pressure medicine or medicines that make the body lose excess fluids (diuretics). Sometimes, this condition can be caused by not taking medicine as directed, such as taking too much of a certain medicine.  What increases the risk? Certain factors can make you more likely to develop orthostatic hypotension, including:  Age. Risk increases as you get older.  Conditions that affect the heart or the central nervous system.  Taking certain medicines, such as blood pressure medicine or diuretics.  Being pregnant.  What are the signs or symptoms? Symptoms of this condition may include:  Weakness.  Light-headedness.  Dizziness.  Blurred vision.  Fatigue.  Rapid heartbeat.  Fainting, in severe cases.  How is this  diagnosed? This condition is diagnosed based on:  Your medical history.  Your symptoms.  Your blood pressure measurement. Your health care provider will check your blood pressure when you are: ? Lying down. ? Sitting. ? Standing.  A blood pressure reading is recorded as two numbers, such as "120 over 80" (or 120/80). The first ("top") number is called the systolic pressure. It is a measure of the pressure in your arteries as your heart beats. The second ("bottom") number is called the diastolic pressure. It is a measure of the pressure in your arteries when your heart relaxes between beats. Blood pressure is measured in a unit called mm Hg. Healthy blood pressure for adults is 120/80. If your blood pressure is below 90/60, you may be diagnosed with hypotension. Other information or tests that may be used to diagnose orthostatic hypotension include:  Your other vital signs, such as your heart rate and temperature.  Blood tests.  Tilt table test. For this test, you will be safely secured to a table that moves you from a lying position to an upright position. Your heart rhythm and blood pressure will be monitored during the test.  How is this treated? Treatment for this condition may include:  Changing your diet. This may involve eating more salt (sodium) or drinking more water.  Taking medicines to raise your blood pressure.  Changing the dosage of certain medicines you are taking that might be lowering your blood pressure.  Wearing compression stockings. These stockings help to prevent blood clots and reduce swelling in your legs.  In some cases, you may need to go to the hospital for:    Fluid replacement. This means you will receive fluids through an IV tube.  Blood replacement. This means you will receive donated blood through an IV tube (transfusion).  Treating an infection or heart problems, if this applies.  Monitoring. You may need to be monitored while medicines that you  are taking wear off.  Follow these instructions at home: Eating and drinking   Drink enough fluid to keep your urine clear or pale yellow.  Eat a healthy diet and follow instructions from your health care provider about eating or drinking restrictions. A healthy diet includes: ? Fresh fruits and vegetables. ? Whole grains. ? Lean meats. ? Low-fat dairy products.  Eat extra salt only as directed. Do not add extra salt to your diet unless your health care provider told you to do that.  Eat frequent, small meals.  Avoid standing up suddenly after eating. Medicines  Take over-the-counter and prescription medicines only as told by your health care provider. ? Follow instructions from your health care provider about changing the dosage of your current medicines, if this applies. ? Do not stop or adjust any of your medicines on your own. General instructions  Wear compression stockings as told by your health care provider.  Get up slowly from lying down or sitting positions. This gives your blood pressure a chance to adjust.  Avoid hot showers and excessive heat as directed by your health care provider.  Return to your normal activities as told by your health care provider. Ask your health care provider what activities are safe for you.  Do not use any products that contain nicotine or tobacco, such as cigarettes and e-cigarettes. If you need help quitting, ask your health care provider.  Keep all follow-up visits as told by your health care provider. This is important. Contact a health care provider if:  You vomit.  You have diarrhea.  You have a fever for more than 2-3 days.  You feel more thirsty than usual.  You feel weak and tired. Get help right away if:  You have chest pain.  You have a fast or irregular heartbeat.  You develop numbness in any part of your body.  You cannot move your arms or your legs.  You have trouble speaking.  You become sweaty or feel  lightheaded.  You faint.  You feel short of breath.  You have trouble staying awake. +++++++++++++++++++++++++++++++++++++++++++  Hypertension Hypertension, commonly called high blood pressure, is when the force of blood pumping through the arteries is too strong. The arteries are the blood vessels that carry blood from the heart throughout the body. Hypertension forces the heart to work harder to pump blood and may cause arteries to become narrow or stiff. Having untreated or uncontrolled hypertension can cause heart attacks, strokes, kidney disease, and other problems. A blood pressure reading consists of a higher number over a lower number. Ideally, your blood pressure should be below 120/80. The first ("top") number is called the systolic pressure. It is a measure of the pressure in your arteries as your heart beats. The second ("bottom") number is called the diastolic pressure. It is a measure of the pressure in your arteries as the heart relaxes. What are the causes? The cause of this condition is not known. What increases the risk? Some risk factors for high blood pressure are under your control. Others are not. Factors you can change  Smoking.  Having type 2 diabetes mellitus, high cholesterol, or both.  Not getting enough exercise or physical  activity.  Being overweight.  Having too much fat, sugar, calories, or salt (sodium) in your diet.  Drinking too much alcohol. Factors that are difficult or impossible to change  Having chronic kidney disease.  Having a family history of high blood pressure.  Age. Risk increases with age.  Race. You may be at higher risk if you are African-American.  Gender. Men are at higher risk than women before age 60. After age 33, women are at higher risk than men.  Having obstructive sleep apnea.  Stress. What are the signs or symptoms? Extremely high blood pressure (hypertensive crisis) may  cause:  Headache.  Anxiety.  Shortness of breath.  Nosebleed.  Nausea and vomiting.  Severe chest pain.  Jerky movements you cannot control (seizures).  How is this diagnosed? This condition is diagnosed by measuring your blood pressure while you are seated, with your arm resting on a surface. The cuff of the blood pressure monitor will be placed directly against the skin of your upper arm at the level of your heart. It should be measured at least twice using the same arm. Certain conditions can cause a difference in blood pressure between your right and left arms. Certain factors can cause blood pressure readings to be lower or higher than normal (elevated) for a short period of time:  When your blood pressure is higher when you are in a health care provider's office than when you are at home, this is called white coat hypertension. Most people with this condition do not need medicines.  When your blood pressure is higher at home than when you are in a health care provider's office, this is called masked hypertension. Most people with this condition may need medicines to control blood pressure.  If you have a high blood pressure reading during one visit or you have normal blood pressure with other risk factors:  You may be asked to return on a different day to have your blood pressure checked again.  You may be asked to monitor your blood pressure at home for 1 week or longer.  If you are diagnosed with hypertension, you may have other blood or imaging tests to help your health care provider understand your overall risk for other conditions. How is this treated? This condition is treated by making healthy lifestyle changes, such as eating healthy foods, exercising more, and reducing your alcohol intake. Your health care provider may prescribe medicine if lifestyle changes are not enough to get your blood pressure under control, and if:  Your systolic blood pressure is above  130.  Your diastolic blood pressure is above 80.  Your personal target blood pressure may vary depending on your medical conditions, your age, and other factors. Follow these instructions at home: Eating and drinking  Eat a diet that is high in fiber and potassium, and low in sodium, added sugar, and fat. An example eating plan is called the DASH (Dietary Approaches to Stop Hypertension) diet. To eat this way: ? Eat plenty of fresh fruits and vegetables. Try to fill half of your plate at each meal with fruits and vegetables. ? Eat whole grains, such as whole wheat pasta, brown rice, or whole grain bread. Fill about one quarter of your plate with whole grains. ? Eat or drink low-fat dairy products, such as skim milk or low-fat yogurt. ? Avoid fatty cuts of meat, processed or cured meats, and poultry with skin. Fill about one quarter of your plate with lean proteins, such as fish,  chicken without skin, beans, eggs, and tofu. ? Avoid premade and processed foods. These tend to be higher in sodium, added sugar, and fat.  Reduce your daily sodium intake. Most people with hypertension should eat less than 1,500 mg of sodium a day.  Limit alcohol intake to no more than 1 drink a day for nonpregnant women and 2 drinks a day for men. One drink equals 12 oz of beer, 5 oz of wine, or 1 oz of hard liquor. Lifestyle  Work with your health care provider to maintain a healthy body weight or to lose weight. Ask what an ideal weight is for you.  Get at least 30 minutes of exercise that causes your heart to beat faster (aerobic exercise) most days of the week. Activities may include walking, swimming, or biking.  Include exercise to strengthen your muscles (resistance exercise), such as pilates or lifting weights, as part of your weekly exercise routine. Try to do these types of exercises for 30 minutes at least 3 days a week.  Do not use any products that contain nicotine or tobacco, such as cigarettes and  e-cigarettes. If you need help quitting, ask your health care provider.  Monitor your blood pressure at home as told by your health care provider.  Keep all follow-up visits as told by your health care provider. This is important. Medicines  Take over-the-counter and prescription medicines only as told by your health care provider. Follow directions carefully. Blood pressure medicines must be taken as prescribed.  Do not skip doses of blood pressure medicine. Doing this puts you at risk for problems and can make the medicine less effective.  Ask your health care provider about side effects or reactions to medicines that you should watch for. Contact a health care provider if:  You think you are having a reaction to a medicine you are taking.  You have headaches that keep coming back (recurring).  You feel dizzy.  You have swelling in your ankles.  You have trouble with your vision. Get help right away if:  You develop a severe headache or confusion.  You have unusual weakness or numbness.  You feel faint.  You have severe pain in your chest or abdomen.  You vomit repeatedly.  You have trouble breathing. Summary  Hypertension is when the force of blood pumping through your arteries is too strong. If this condition is not controlled, it may put you at risk for serious complications.  Your personal target blood pressure may vary depending on your medical conditions, your age, and other factors. For most people, a normal blood pressure is less than 120/80.  Hypertension is treated with lifestyle changes, medicines, or a combination of both. Lifestyle changes include weight loss, eating a healthy, low-sodium diet, exercising more, and limiting alcohol.

## 2018-03-26 NOTE — Progress Notes (Signed)
Carelink Summary Report / Loop Recorder 

## 2018-03-27 ENCOUNTER — Encounter: Payer: Self-pay | Admitting: Internal Medicine

## 2018-03-27 ENCOUNTER — Ambulatory Visit: Payer: Medicare Other | Admitting: Internal Medicine

## 2018-03-27 VITALS — BP 192/100 | HR 72 | Temp 97.4°F | Resp 16 | Ht 70.0 in | Wt 191.0 lb

## 2018-03-27 DIAGNOSIS — I10 Essential (primary) hypertension: Secondary | ICD-10-CM | POA: Diagnosis not present

## 2018-03-27 DIAGNOSIS — R0789 Other chest pain: Secondary | ICD-10-CM | POA: Diagnosis not present

## 2018-03-27 DIAGNOSIS — I951 Orthostatic hypotension: Secondary | ICD-10-CM

## 2018-03-27 DIAGNOSIS — G903 Multi-system degeneration of the autonomic nervous system: Secondary | ICD-10-CM | POA: Diagnosis not present

## 2018-03-27 MED ORDER — OLMESARTAN MEDOXOMIL 40 MG PO TABS: ORAL_TABLET | ORAL | 2 refills | Status: DC

## 2018-03-27 MED ORDER — PREDNISONE 20 MG PO TABS: ORAL_TABLET | ORAL | 0 refills | Status: DC

## 2018-03-28 ENCOUNTER — Encounter: Payer: Self-pay | Admitting: Adult Health

## 2018-03-28 ENCOUNTER — Ambulatory Visit: Payer: Medicare Other | Admitting: Adult Health

## 2018-03-28 ENCOUNTER — Telehealth: Payer: Self-pay

## 2018-03-28 VITALS — BP 163/93 | HR 77 | Wt 191.6 lb

## 2018-03-28 DIAGNOSIS — E785 Hyperlipidemia, unspecified: Secondary | ICD-10-CM

## 2018-03-28 DIAGNOSIS — I63511 Cerebral infarction due to unspecified occlusion or stenosis of right middle cerebral artery: Secondary | ICD-10-CM

## 2018-03-28 DIAGNOSIS — I10 Essential (primary) hypertension: Secondary | ICD-10-CM | POA: Diagnosis not present

## 2018-03-28 DIAGNOSIS — R42 Dizziness and giddiness: Secondary | ICD-10-CM | POA: Diagnosis not present

## 2018-03-28 MED ORDER — EVOLOCUMAB 140 MG/ML ~~LOC~~ SOSY: 1.0000 | PREFILLED_SYRINGE | SUBCUTANEOUS | 3 refills | Status: DC

## 2018-03-28 NOTE — Patient Instructions (Signed)
Continue aspirin 81 mg daily and clopidogrel 75 mg daily for secondary stroke prevention  Speak to your cardiologist regarding continuation of aspirin as from a stroke standpoint, we recommend that you continue on plavix alone  You will be called to schedule CT angio of your head and neck to complete stroke work up  You will be called to schedule physical therapy at our [facility]  We will start Ramer for cholesterol management   Continue to follow up with PCP/cardiologist regarding cholesterol, blood pressure and diabetes management   Maintain strict control of hypertension with blood pressure goal below 130/90, diabetes with hemoglobin A1c goal below 6.5% and cholesterol with LDL cholesterol (bad cholesterol) goal below 70 mg/dL. I also advised the patient to eat a healthy diet with plenty of whole grains, cereals, fruits and vegetables, exercise regularly and maintain ideal body weight.  Followup in the future with me in 3 months or call earlier if needed        Thank you for coming to see [place] at [facility]. I hope we have been able to provide you high quality care today.  You may receive a patient satisfaction survey over the next few weeks. We would appreciate your feedback and comments so that we may continue to improve ourselves and the health of our patients.

## 2018-03-28 NOTE — Progress Notes (Signed)
Personally  participated in, made any corrections needed, and agree with history, physical, neuro exam,assessment and plan as stated above.    Antonia Ahern, MD [facility] 

## 2018-03-28 NOTE — Telephone Encounter (Signed)
PA approve for Repatha thru 09/26/2018 via cover my meds.

## 2018-03-28 NOTE — Progress Notes (Signed)
[facility]    Provider:  Dr [name] Referring Provider: [name], MD Primary Care Physician:  Dennis Pinto, MD  CC:  Headaches for 2 months  Interval history 03/28/2018: Patient is being seen today for requested sooner follow-up by patient.  Patient called office on 02/09/2018 when she became extremely weak, dizzy, shaky and with blurry vision.  She followed up with her PCP with labs obtained, orthostatic vital signs done along with vision testing.  After prior visit, MRI cervical spine was ordered which was then performed and this was recommended to be obtained along with MRI brain due to her new symptoms.  MRI brain obtained on 02/23/2018 which was reviewed and did show small acute stroke in the left splenium which was not present on MRI obtained 01/24/2018.  This MRI also reconfirmed prior right temporal lobe stroke along with chronic lacunar infarctions in the right basal ganglia, cerebellum and corona radiata.  MRI cervical spine was negative for acute abnormality.  Continues to experience dizziness which has worsened in the past 1.5 months as he does have underlying dizziness issues after bypass procedure. Consistent dizzy sensation, does worsen with standing. Denies room spinning sensation. Blurry vision also continues which has been consistent since initial stroke in September.   Glucose levels have been good with recent readings at 113 this morning. He has been trying to avoid carbs and eating healthier.  Blood pressure today 163/93 and has been working with PCP/cardiologist to obtain normal readings  2D echo obtained on 02/01/2018 which showed ejection fraction 55 to 60% and negative for PFO.  After stroke found on 01/24/2018 scans, he was started on Plavix along with continuation of aspirin 81 mg.  He has continued on both his medications and denies any side effects of bleeding or bruising. Recent lab work by PCP on 01/30/2018 with a lipid panel showing  total cholesterol 160, HDL 32, triglycerides 199 and LDL 98.  A1c 6.2.  He attempted to use Zetia for HLD management but states it caused increased dizziness compared to his dizziness prior to initiating and is worsening dizziness resolved after discontinuing Zetia.  Unfortunately, patient is intolerant to statins as he has tried atorvastatin, pravastatin and Crestor in the past which all cause muscle aches and pains which resolved after discontinuation.  Loop recorder placed on 02/19/2018  Patient is followed by Dr. Naaman Frank in physical medicine for his chronic pain       HPI 01/30/2018 Dr. Jaynee Frank:  [name] is a 84 y.o. male here as requested by Dr. Melford Frank for headaches. PMHx PVD, CAD, DIabetes with complications, dysautonomia orthostatic hypotension, DDD, CKD stage 3, vitamin B12 deficiency.  He woke up with a headache 2 months ago, he has a history of "sinus headaches", wife is here and says he has had headaches for much longer than 2 months, he takes daily tylenol to help with back and pain, no ibuprofen.  Wife says frequent headaches for longer. Worsened 2 months ago. He saw the eye doctor. Esr and crp were unremarkable. Several weeks ago his balance worsened likely around the time of the stoke. His headache is behind the right ear, and in the back of the head, worse with laying down on the back of his head, throbbing, he has chronic neck pain and degenerative arthritis. No recent falls.  Reviewed notes, labs and imaging from outside physicians, which showed:   MRI brain: reviewed imaging and agree with the following 01/24/2018:  IMPRESSION: 1. Small acute infarct in the  right temporal stem. 2. Moderate to severe chronic small vessel ischemic disease. 3. 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 4. Advanced atherosclerosis involving the posterior circulation including multiple severe PCA stenoses. No large vessel occlusion.  hgba1c 5.8 ldl 122 TSH nml    Review  of Systems: Patient complains of symptoms per HPI as well as the following symptoms: Fatigue, blurred vision, feeling hot, feeling cold, palpitations, spinning session, trouble swallowing, memory loss, headache, difficulty swallowing, dizziness, decreased energy, disinterest in activities. Pertinent negatives and positives per HPI. All others negative.   Social History   Socioeconomic History  . Marital status: Married    Spouse name: Not on file  . Number of children: 2  . Years of education: Not on file  . Highest education level: High school graduate  Occupational History  . Occupation: Retired  Scientific laboratory technician  . Financial resource strain: Not on file  . Food insecurity:    Worry: Not on file    Inability: Not on file  . Transportation needs:    Medical: Not on file    Non-medical: Not on file  Tobacco Use  . Smoking status: Former Smoker    Last attempt to quit: 07/16/1963    Years since quitting: 54.7  . Smokeless tobacco: Former Systems developer    Types: Chew    Quit date: 1989  Substance and Sexual Activity  . Alcohol use: No  . Drug use: No  . Sexual activity: Not Currently  Lifestyle  . Physical activity:    Days per week: Not on file    Minutes per session: Not on file  . Stress: Not on file  Relationships  . Social connections:    Talks on phone: Not on file    Gets together: Not on file    Attends religious service: Not on file    Active member of club or organization: Not on file    Attends meetings of clubs or organizations: Not on file    Relationship status: Not on file  . Intimate partner violence:    Fear of current or ex partner: Not on file    Emotionally abused: Not on file    Physically abused: Not on file    Forced sexual activity: Not on file  Other Topics Concern  . Not on file  Social History Narrative   Lives at home with his wife [name]   Right handed   No caffeine    Family History  Problem Relation Age of Onset  . Heart attack Father         died age 14  . Heart attack Brother        died age 39  . Anuerysm Brother        aortic  . Heart attack Sister        died age 72  . Colon cancer Sister   . Liver cancer Sister   . Diabetes Maternal Grandmother   . Colon polyps Sister        and brothers x 2     Past Medical History:  Diagnosis Date  . 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 Colon polyps   . Coronary atherosclerosis of unspecified type of vessel, native or graft   . Diabetes ([place])   . Difficult intubation   . Esophageal reflux   . High cholesterol   . Hypertension    pt denies, he says he has a h/o hypotension. If BP up he adjusts the Florinef  .  IBS (irritable bowel syndrome)   . Orthostatic hypotension    "BP has been dropping alot when I stand up for the last month or so" (02/17/2016)  . Vitamin B 12 deficiency   . Vitamin D deficiency     Past Surgical History:  Procedure Laterality Date  . BLADDER SURGERY  1969   traumatic pelvic fractures, urethral and bladder repair  . CARDIAC CATHETERIZATION N/A 07/01/2015   Procedure: Left Heart Cath and Coronary Angiography;  Surgeon: Wellington Hampshire, MD;  Location: [facility];  Service: Cardiovascular;  Laterality: N/A;  . COLON RESECTION N/A 05/17/2017   Procedure: DIAGNOSTIC LAPAROSCOPY,;  Surgeon: Leighton Ruff, MD;  Location: [facility];  Service: General;  Laterality: N/A;  . CORONARY ANGIOPLASTY WITH STENT PLACEMENT    . CORONARY ARTERY BYPASS GRAFT N/A 07/06/2015   Procedure: CORONARY ARTERY BYPASS GRAFTING (CABG)x 4   utilizing the left internal mammary artery and endoscopically harvested bilateral  sapheneous vein.;  Surgeon: Ivin Poot, MD;  Location: [facility];  Service: Open Heart Surgery;  Laterality: N/A;  . KNEE SURGERY    . LOOP RECORDER INSERTION N/A 02/19/2018   Procedure: LOOP RECORDER INSERTION;  Surgeon: Deboraha Sprang, MD;  Location: [facility];  Service: Cardiovascular;  Laterality: N/A;  . TEE WITHOUT CARDIOVERSION N/A 07/06/2015     Procedure: TRANSESOPHAGEAL ECHOCARDIOGRAM (TEE);  Surgeon: Ivin Poot, MD;  Location: [facility];  Service: Open Heart Surgery;  Laterality: N/A;    Current Outpatient Medications  Medication Sig Dispense Refill  . acetaminophen (TYLENOL) 500 MG tablet Take 1,000 mg by mouth as needed for mild pain.    Marland Kitchen aspirin EC 81 MG EC tablet Take 1 tablet (81 mg total) by mouth daily.    Marland Kitchen CALCIUM PO Take 1 tablet by mouth daily.    . Cholecalciferol (VITAMIN D-3) 5000 units TABS Take 5,000 Units by mouth daily.    . clopidogrel (PLAVIX) 75 MG tablet Take 1 tablet (75 mg total) by mouth daily. (Patient taking differently: Take 75 mg by mouth at bedtime. ) 30 tablet 11  . diclofenac sodium (VOLTAREN) 1 % GEL Apply 2 g topically to affected area 4 times daily (Patient taking differently: Apply 2-4 g topically 4 (four) times daily as needed (for leg pain.). ) 300 g 2  . gabapentin (NEURONTIN) 100 MG capsule TAKE 2 CAPSULE(200 MG) BY MOUTH 2 TIMES A DAY. (Patient taking differently: Take 200 mg by mouth 2 (two) times daily. TAKE 2 CAPSULE(200 MG) BY MOUTH 2 TIMES A DAY.) 360 capsule 3  . glucose blood test strip Check blood sugar 1 time daily-DX-E11.9 100 each 5  . Multiple Vitamin (MULTIVITAMIN WITH MINERALS) TABS tablet Take 1 tablet by mouth daily.    Marland Kitchen olmesartan (BENICAR) 40 MG tablet Take 1/2 to 1 tablet daily for BP 30 tablet 2  . omeprazole (PRILOSEC) 20 MG capsule TAKE 2 CAPSULES BY MOUTH EVERY DAY FOR INDIGESTION (Patient taking differently: Take 20 mg by mouth 2 (two) times daily. ) 180 capsule 1  . polyethylene glycol (MIRALAX / GLYCOLAX) packet Take 17 g by mouth daily. (Patient taking differently: Take 17 g by mouth 2 (two) times daily. ) 28 each 1  . predniSONE (DELTASONE) 20 MG tablet 1 tab 3 x day for 5 days, then 1 tab 2 x day for 5 days, then 1 tab 1 x day for 5 days 30 tablet 0   No current facility-administered medications for this visit.  Allergies as of 03/28/2018 - Review  Complete 03/28/2018  Allergen Reaction Noted  . Cymbalta [duloxetine hcl]  03/18/2013  . Keflex [cephalexin] Other (See Comments) 03/18/2013  . Simvastatin Other (See Comments) 03/18/2013  . Sudafed [pseudoephedrine]  03/18/2013    Vitals: BP (!) 163/93 (BP Location: Left Arm, Patient Position: Sitting, Cuff Size: Normal)   Pulse 77   Wt 191 lb 9.6 oz (86.9 kg)   BMI 27.49 kg/m  Last Weight:  Wt Readings from Last 1 Encounters:  03/28/18 191 lb 9.6 oz (86.9 kg)   Last Height:   Ht Readings from Last 1 Encounters:  03/27/18 '5\' 10"'  (1.778 m)   Physical exam: Exam: Gen: NAD, pleasant elderly Caucasian male, conversant, well nourised, obese, well groomed                     CV:  RRR, no MRG. No Carotid Bruits. No peripheral edema, warm, nontender Eyes: Conjunctivae clear without exudates or hemorrhage MSK:  Loss of normal cervical lordosis.    Neuro: Detailed Neurologic Exam  Speech:    Speech is normal; fluent and spontaneous with normal comprehension.  Cognition:    The patient is oriented to person, place, and time;     recent and remote memory intact;     language fluent;     normal attention, concentration,     fund of knowledge Cranial Nerves:    The pupils are equal, round, and reactive to light.  Visual fields are full to finger confrontation. Extraocular movements are intact and negative for nystagmus. Trigeminal sensation is intact and the muscles of mastication are normal. The face is symmetric. The palate elevates in the midline. Hearing intact. Voice is normal. Shoulder shrug is normal. The tongue has normal motion without fasciculations.   Coordination:    No dysmetria  Gait:    Imbalance on standing, using a cane, no shuffling, not ataxic.  Romberg positive  Motor Observation:    No asymmetry, no atrophy, and no involuntary movements noted. Tone:    Normal muscle tone.    Posture:    Posture is slightly stooped    Strength:    Equal strength in  all tested extremities     Sensation: decreased LE distally to all modalities,     Reflex Exam:  DTR's:    Absent lowers, hypo uppers.    Toes:    The toes are equiv bilaterally.   Clonus:    Clonus is absent.       Assessment/Plan:   84 y.o. Lovely male here as requested by Dr. Melford Frank for headaches. PMHx PVD, CAD, DIabetes with complications, dysautonomia orthostatic hypotension, DDD, CKD stage 3, vitamin B12 deficiency.  MRI of the brain showed an acute lacunar stroke likely secondary to small vessel disease due to significant vascular risk factors, chronic advanced microvascular disease however cannot rule out embolic/afib as cause.  Repeat MRI obtained due to recurrent dizzy symptoms with visual changes and was found to have acute infarct in the left splenium on 02/23/2018.   Due to recurrent infarct, recommended to obtain CTA head/neck to complete stroke work-up.  It was also recommended to initiate management for HLD due to LDL greater than 70 for secondary stroke prevention and prescribed Repatha which was reviewed with patient.  Advised patient from a stroke standpoint, he continue on Plavix alone but advised him to follow-up with cardiologist regarding need of continuation of aspirin. Continue to monitor loop recorder for potential atrial fibrillation.  Due to continued dizziness sensation, referral placed to physical therapy at our neuro rehab unit. Maintain strict control of hypertension with blood pressure goal below 130/90, diabetes with hemoglobin A1c goal below 6.5% and cholesterol with LDL cholesterol (bad cholesterol) goal below 70 mg/dL. I also advised the patient to eat a healthy diet with plenty of whole grains, cereals, fruits and vegetables, exercise regularly and maintain ideal body weight.  Patient will follow-up in 3 months time  Greater than 50% of time during this 25 minute visit was spent on counseling,explanation of diagnosis of multiple infarcts, reviewing risk  factor management of HTN, HLD, DM, planning of further management, discussion with patient and family and coordination of care   Venancio Poisson, AGNP-BC  [facility] [address] [place], [place] [address]  Phone [phone] Fax [phone] Note: This document was prepared with digital dictation and possible smart phrase technology. Any transcriptional errors that result from this process are unintentional.

## 2018-03-28 NOTE — Telephone Encounter (Signed)
PA done on cover my meds for repatha.

## 2018-04-11 ENCOUNTER — Ambulatory Visit: Payer: Medicare Other | Admitting: Internal Medicine

## 2018-04-11 ENCOUNTER — Encounter: Payer: Self-pay | Admitting: Internal Medicine

## 2018-04-11 VITALS — BP 142/78 | HR 76 | Temp 97.8°F | Resp 16 | Ht 70.0 in | Wt 185.0 lb

## 2018-04-11 DIAGNOSIS — R0989 Other specified symptoms and signs involving the circulatory and respiratory systems: Secondary | ICD-10-CM | POA: Diagnosis not present

## 2018-04-11 DIAGNOSIS — I951 Orthostatic hypotension: Secondary | ICD-10-CM

## 2018-04-11 DIAGNOSIS — G903 Multi-system degeneration of the autonomic nervous system: Secondary | ICD-10-CM | POA: Diagnosis not present

## 2018-04-11 NOTE — Progress Notes (Signed)
Subjective:    Patient ID: [name], male    DOB: 10-23-1933, 84 y.o.   MRN: [id number]  HPI    This very nice 84 yo MWM with Dysautonomia and labile HTN complicated by postural orthostasis was seen 2 weeks ago with labile elevated BP's 170/100 and was advise to with hold his Florinef and given Rx Benicar /Olmesartan  And in the interim his BP's dropped and his wife appropriately stopped the olmesartan and restarted the Florinef. Today his BP's sitting is 142/78 and drop with standing dropped to 102/56. Pulse increased from 76 --> 96.  He apparently only drinks about 1  bottle of water, etc /day.  He does endorse some postural lightheadedness with standing and also describes some vertiginous type sx's with positional changes . He's apparently scheduled for Vestibular eval on Mon 04/16/18.  Medication Sig  . Acetaminophen 500 MG tablet Take 1,000 mg by mouth as needed for mild pain.  Marland Kitchen aspirin EC 81 MG EC tablet Take 1 tablet  daily.  Marland Kitchen CALCIUM PO Take 1 tablet  daily.  Marland Kitchen VITAMIN D 5000 units TABS Take  daily.  . clopidogrel  75 MG tablet Take 1 tablet  daily.  . diclofenac 1 % GEL Apply 2 g topically to affected area 4 times daily   . REPATHA 140 MG/ML SOSY Inject 1 Syringe into the skin every 14 (fourteen) days.  Marland Kitchen gabapentin  100 MG capsule TAKE 2 CAP 2 TIMES A DAY.   . Multiple Vitamin w/Min Take 1 tablet by mouth daily.  Marland Kitchen olmesartan  40 MG tablet Take 1/2 to 1 tablet daily for BP  . polyet packet Take 17 g   2  times daily   Allergies  Allergen Reactions  . Cymbalta [Duloxetine Hcl]     Dizziness  . Keflex [Cephalexin] Other (See Comments)    Reaction: unknown   . Simvastatin Other (See Comments)    Reaction: unknown   . Sudafed [Pseudoephedrine]     Dizziness   Past Medical History:  Diagnosis Date  . 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 Colon polyps   . Coronary atherosclerosis of unspecified type of vessel, native or graft   . Diabetes ([place])   . Difficult intubation    . Esophageal reflux   . High cholesterol   . Hypertension    pt denies, he says he has a h/o hypotension. If BP up he adjusts the Florinef  . IBS (irritable bowel syndrome)   . Orthostatic hypotension    "BP has been dropping alot when I stand up for the last month or so" (02/17/2016)  . Vitamin B 12 deficiency   . Vitamin D deficiency    Past Surgical History:  Procedure Laterality Date  . BLADDER SURGERY  1969   traumatic pelvic fractures, urethral and bladder repair  . CARDIAC CATHETERIZATION N/A 07/01/2015   Procedure: Left Heart Cath and Coronary Angiography;  Surgeon: Wellington Hampshire, MD;  Location: [facility];  Service: Cardiovascular;  Laterality: N/A;  . COLON RESECTION N/A 05/17/2017   Procedure: DIAGNOSTIC LAPAROSCOPY,;  Surgeon: Leighton Ruff, MD;  Location: [facility];  Service: General;  Laterality: N/A;  . CORONARY ANGIOPLASTY WITH STENT PLACEMENT    . CORONARY ARTERY BYPASS GRAFT N/A 07/06/2015   Procedure: CORONARY ARTERY BYPASS GRAFTING (CABG)x 4   utilizing the left internal mammary artery and endoscopically harvested bilateral  sapheneous vein.;  Surgeon: Ivin Poot, MD;  Location: [facility];  Service:  Open Heart Surgery;  Laterality: N/A;  . KNEE SURGERY    . LOOP RECORDER INSERTION N/A 02/19/2018   Procedure: LOOP RECORDER INSERTION;  Surgeon: Deboraha Sprang, MD;  Location: [facility];  Service: Cardiovascular;  Laterality: N/A;  . TEE WITHOUT CARDIOVERSION N/A 07/06/2015   Procedure: TRANSESOPHAGEAL ECHOCARDIOGRAM (TEE);  Surgeon: Ivin Poot, MD;  Location: [facility];  Service: Open Heart Surgery;  Laterality: N/A;   10 point systems review negative except as above.    Objective:   Physical Exam  BP  142/78-sit /102/56-stand  P 76 &   102/56-standing and P 96   Te 97.8 F (R 16   Ht 5\' 10"  (1.778 m)   Wt 185 lb   BMI 26.54 kg/m   HEENT - WNL. Neck - supple.  Chest - Clear equal BS. Cor - Nl HS. RRR w/o sig MGR. PP 1(+). No edema. MS-  FROM w/o deformities.  Gait Nl. Neuro -  Nl w/o focal abnormalities.    Assessment & Plan:   1. Labile hypertension  2. Dysautonomia orthostatic hypotension syndrome (HCC)  3. Orthostatic hypotension  - advised to increase fluids to at least 6 bottles (16 oz) /daily to expand vascular volumes & restart the Olmesartan at 1/2 tab dose.  - discussed meds & SE's.   - ROV 2 weeks to reassess clinical status.

## 2018-04-16 ENCOUNTER — Ambulatory Visit
Admission: RE | Admit: 2018-04-16 | Discharge: 2018-04-16 | Disposition: A | Discharge status: Home / Self Care | Payer: Medicare Other | Source: Ambulatory Visit | Attending: Adult Health | Admitting: Adult Health

## 2018-04-16 ENCOUNTER — Other Ambulatory Visit: Payer: Self-pay

## 2018-04-16 ENCOUNTER — Encounter: Payer: Self-pay | Admitting: Physical Therapy

## 2018-04-16 ENCOUNTER — Ambulatory Visit: Payer: Medicare Other | Attending: Adult Health | Admitting: Physical Therapy

## 2018-04-16 VITALS — BP 102/70 | HR 86

## 2018-04-16 DIAGNOSIS — M6281 Muscle weakness (generalized): Secondary | ICD-10-CM | POA: Insufficient documentation

## 2018-04-16 DIAGNOSIS — I63231 Cerebral infarction due to unspecified occlusion or stenosis of right carotid arteries: Secondary | ICD-10-CM | POA: Diagnosis not present

## 2018-04-16 DIAGNOSIS — R2689 Other abnormalities of gait and mobility: Secondary | ICD-10-CM | POA: Diagnosis not present

## 2018-04-16 DIAGNOSIS — R42 Dizziness and giddiness: Secondary | ICD-10-CM | POA: Diagnosis not present

## 2018-04-16 DIAGNOSIS — I63533 Cerebral infarction due to unspecified occlusion or stenosis of bilateral posterior cerebral arteries: Secondary | ICD-10-CM | POA: Diagnosis not present

## 2018-04-16 DIAGNOSIS — R29818 Other symptoms and signs involving the nervous system: Secondary | ICD-10-CM | POA: Diagnosis not present

## 2018-04-16 MED ORDER — IOPAMIDOL (ISOVUE-370) INJECTION 76%
75.0000 mL | Freq: Once | INTRAVENOUS | Status: AC | PRN
  Administered 2018-04-16: 75 mL via INTRAVENOUS

## 2018-04-16 NOTE — Therapy (Signed)
[facility] [address] [place], [place], [address] Phone: [phone]   Fax:  [phone]  Physical Therapy Evaluation  Patient Details  Name: [name] MRN: [id number] Date of Birth: 1934/01/27 Referring Provider (PT): Venancio Poisson, NP   Encounter Date: 04/16/2018  PT End of Session - 04/16/18 2121    Visit Number  1    Number of Visits  17    Date for PT Re-Evaluation  05/16/18    Authorization Type  [employer] Medicare    Authorization Time Period  04/16/18 to 07/15/2018    PT Start Time  0748    PT Stop Time  0838    PT Time Calculation (min)  50 min    Equipment Utilized During Treatment  Gait belt    Activity Tolerance  Patient tolerated treatment well    Behavior During Therapy  [facility] for tasks assessed/performed       Past Medical History:  Diagnosis Date  . 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 Colon polyps   . Coronary atherosclerosis of unspecified type of vessel, native or graft   . Diabetes ([place])   . Difficult intubation   . Esophageal reflux   . High cholesterol   . Hypertension    pt denies, he says he has a h/o hypotension. If BP up he adjusts the Florinef  . IBS (irritable bowel syndrome)   . Orthostatic hypotension    "BP has been dropping alot when I stand up for the last month or so" (02/17/2016)  . Vitamin B 12 deficiency   . Vitamin D deficiency     Past Surgical History:  Procedure Laterality Date  . BLADDER SURGERY  1969   traumatic pelvic fractures, urethral and bladder repair  . CARDIAC CATHETERIZATION N/A 07/01/2015   Procedure: Left Heart Cath and Coronary Angiography;  Surgeon: Wellington Hampshire, MD;  Location: [facility];  Service: Cardiovascular;  Laterality: N/A;  . COLON RESECTION N/A 05/17/2017   Procedure: DIAGNOSTIC LAPAROSCOPY,;  Surgeon: Leighton Ruff, MD;  Location: [facility];  Service: General;  Laterality: N/A;  . CORONARY ANGIOPLASTY WITH STENT PLACEMENT     . CORONARY ARTERY BYPASS GRAFT N/A 07/06/2015   Procedure: CORONARY ARTERY BYPASS GRAFTING (CABG)x 4   utilizing the left internal mammary artery and endoscopically harvested bilateral  sapheneous vein.;  Surgeon: Ivin Poot, MD;  Location: [facility];  Service: Open Heart Surgery;  Laterality: N/A;  . KNEE SURGERY    . LOOP RECORDER INSERTION N/A 02/19/2018   Procedure: LOOP RECORDER INSERTION;  Surgeon: Deboraha Sprang, MD;  Location: [facility];  Service: Cardiovascular;  Laterality: N/A;  . TEE WITHOUT CARDIOVERSION N/A 07/06/2015   Procedure: TRANSESOPHAGEAL ECHOCARDIOGRAM (TEE);  Surgeon: Ivin Poot, MD;  Location: [facility];  Service: Open Heart Surgery;  Laterality: N/A;    Vitals:   04/16/18 0757 04/16/18 0811  BP: 118/80 102/70  Pulse: 78 86  SpO2: 97%      Subjective Assessment - 04/16/18 0757    Subjective  Has had dizziness since his CABG in 2017. Since 11/27 visit to neuro, dizziness is worse. Yesterday was sitting in church and began shaking as if he was cold; when got home he took BP 200/98. No fever. Going to have a CT angiogram today.     Patient is accompained by:  Family member   wife, [name]   Pertinent History  dysautonomia orthostatic hypotension, HTN, DDD, diabetic polyneuropathy,  CKD stage 3; 01/24/18 acute right temporal CVA, Chronic bil basal ganglia and cerebellar infarcts, Advanced atherosclerosis involving the posterior circulation, chronic rt chest pain s/p CABG 2017    Diagnostic tests  MRI 02/23/18 acute left splenium infarct ; MRI 01/24/18 acute right temporal CVA    Patient Stated Goals  to not feel dizzy all the time; to be able to work on his cars    Currently in Pain?  Yes    Pain Score  4     Pain Location  Back    Pain Orientation  Upper    Pain Descriptors / Indicators  Burning;Numbness    Pain Type  Chronic pain   since his CABG   Pain Radiating Towards  shoulder blade around ribs    Pain Onset  More than a month ago    Pain Frequency   Constant    Aggravating Factors   none    Pain Relieving Factors  medicine; works with Dr. Naaman Plummer on pain management              [facility] PT Assessment - 04/16/18 0813      Assessment   Medical Diagnosis  Dizziness after CVA    Referring Provider (PT)  Venancio Poisson, NP    Onset Date/Surgical Date  --   initial dizziness 2017 after CABG; worse with each CVA recen   Prior Therapy  2017 for his back/chest pain after CABG      Precautions   Precautions  Fall      Restrictions   Weight Bearing Restrictions  No      Balance Screen   Has the patient fallen in the past 6 months  No   fell after heart surgery 2017   Has the patient had a decrease in activity level because of a fear of falling?   Yes    Is the patient reluctant to leave their home because of a fear of falling?   No      Home Environment   Living Environment  Private residence    Living Arrangements  Spouse/significant other    Available Help at Discharge  Family;Available 24 hours/day    Type of [place] - single point      Prior Function   Level of Independence  Needs assistance with gait;Needs assistance with homemaking   wife always at his side when walking   Vocation  Retired    Leisure  work on my old cars ( not currently doing)      Cognition   Overall Cognitive Status  Within Functional Limits for tasks assessed      Sensation   Light Touch  Impaired by gross assessment    Additional Comments  reports bil LE numbness up to his knees "for some time" and recently bil hand numbness and cramps      Coordination   Gross Motor Movements are Fluid and Coordinated  No    Fine Motor Movements are Fluid and Coordinated  No      Posture/Postural Control   Posture/Postural Control  Postural limitations    Postural Limitations  Rounded Shoulders;Forward head;Increased thoracic kyphosis;Decreased lumbar lordosis;Posterior pelvic tilt      Transfers   Transfers  Sit to  Stand;Stand to Sit    Sit to Stand  4: Min guard    Stand to Sit  4: Min guard    Comments  wife close by as pt transfers;  he uses wide stance/BOS and arms at low-guard position       Ambulation/Gait   Ambulation/Gait  Yes    Ambulation/Gait Assistance  4: Min guard    Ambulation Distance (Feet)  40 Feet   70   Assistive device  Straight cane    Gait Pattern  Step-through pattern;Decreased arm swing - left;Decreased trunk rotation;Trunk flexed;Narrow base of support;Wide base of support;Poor foot clearance - left;Poor foot clearance - right   stance varied narrow (even near-scissoring) to wide   Ambulation Surface  Level;Indoor    Gait velocity  32.8/15.16=2.16 ft/sec       Standardized Balance Assessment   Standardized Balance Assessment  Timed Up and Go Test      Timed Up and Go Test   Normal TUG (seconds)  20.88           Vestibular Assessment - 04/16/18 0803      Vestibular Assessment   General Observation  walks in with cane, very hesistant      Symptom Behavior   Type of Dizziness  --   everything looks funny; a little bit blurry   Frequency of Dizziness  there all the time; sometimes worse than others    Duration of Dizziness  always    Aggravating Factors  Mornings;Turning body quickly    Relieving Factors  --   sit down; turn slowly;     Occulomotor Exam   Occulomotor Alignment  Normal    Spontaneous  Absent    Gaze-induced  Right beating nystagmus with R gaze    Smooth Pursuits  Saccades   in rt field of vision   Saccades  Poor trajectory   in rt field of vision   Comment  when seated in his chair, the lamp shade to his right looks like it's moving or like a sparkler going off; "happens all the time"      Vestibulo-Occular Reflex   VOR Cancellation  Corrective saccades      Orthostatics   Orthostatics Comment  see vitals section +orthostasis sit to stand          Objective measurements completed on examination: See above findings.               PT Education - 04/16/18 2114    Education Details  results of evaluation (compared to norms where appropriate); dizziness has characteristics related to central (brain) cause; PT may not get rid of his dizziness, but goal is to improve his balance and safety with mobility    Person(s) Educated  Patient;Spouse    Methods  Explanation    Comprehension  Verbalized understanding       PT Short Term Goals - 04/16/18 2140      PT SHORT TERM GOAL #1   Title  independent with initial HEP (Target for all STGs 05/16/2018)    Time  4    Period  Weeks    Status  New    Target Date  05/16/18      PT SHORT TERM GOAL #2   Title  Patient will ambulate with LRAD @ gait velocity of 2.62 ft/sec (indicative of velocity needed for safe community ambulation)    Baseline  04/16/18  2.16 ft/sec     Time  4    Period  Weeks    Status  New      PT SHORT TERM GOAL #3   Title  Pt will improve TUG score to <=17 sec demonstrating a lesser fall  risk.     Baseline  12/16  TUG 20.88 sec    Time  4    Period  Weeks    Status  New        PT Long Term Goals - 04/16/18 2147      PT LONG TERM GOAL #1   Title  independent with updated HEP (Target all LTGs 06/15/2018)    Time  8    Period  Weeks    Status  New    Target Date  06/15/18      PT LONG TERM GOAL #2   Title  Score on DHI will decr >=18 points    Time  8    Period  Weeks    Status  New      PT LONG TERM GOAL #3   Title  Gait velocity will improve to >=2.8 ft/sec to demonstrate lesser fall risk.     Time  8    Period  Weeks    Status  New      PT LONG TERM GOAL #4   Title  Pt will perform TUG in <=15 sec    Time  8    Period  Weeks    Status  New             Plan - 04/16/18 2123    Clinical Impression Statement  Patient referred to OPPT due to dizziness s/p CVA--pt actually has had 2 recent CVAs noted on MRIs on 01/24/18 and 02/23/18. The MRIs also showed chronic bil basal ganglia and cerebellar CVAs.  Patient has never had therapy to address his dizziness. Anticipate he can benefit from PT to educate him on fall risk reduction and exercises for balance and habituation for dizziness.     History and Personal Factors relevant to plan of care:  PMH-dysautonomia orthostatic hypotension, HTN, DDD, diabetic polyneuropathy, CKD stage 3; 01/24/18 acute right temporal CVA, Chronic bil basal ganglia and cerebellar infarcts, Advanced atherosclerosis involving the posterior circulation, chronic rt chest pain s/p CABG 2017; Personal factors- advanced age;     Clinical Presentation  Evolving    Clinical Presentation due to:  two CVAs in the past 2.5 months; advanced posterior circulation atherosclerosis    Clinical Decision Making  Moderate    Rehab Potential  Good    Clinical Impairments Affecting Rehab Potential  dizziness present >2 yrs    PT Frequency  2x / week    PT Duration  8 weeks    PT Treatment/Interventions  ADLs/Self Care Home Management;Canalith Repostioning;DME Instruction;Gait training;Stair training;Functional mobility training;Therapeutic activities;Therapeutic exercise;Balance training;Patient/family education;Neuromuscular re-education;Manual techniques;Vestibular;Visual/perceptual remediation/compensation    PT Next Visit Plan  have pt complete DHI; initiate balance HEP (?include habituation activity); has he considered using a RW and does he own one?    Consulted and Agree with Plan of Care  Patient;Family member/caregiver    Family Member Consulted  wife, [name]       Patient will benefit from skilled therapeutic intervention in order to improve the following deficits and impairments:  Abnormal gait, Cardiopulmonary status limiting activity, Decreased activity tolerance, Decreased balance, Decreased knowledge of use of DME, Decreased mobility, Decreased safety awareness, Dizziness, Impaired perceived functional ability, Impaired vision/preception, Impaired sensation, Postural  dysfunction  Visit Diagnosis: Other abnormalities of gait and mobility - Plan: PT plan of care cert/re-cert  Dizziness and giddiness - Plan: PT plan of care cert/re-cert  Other symptoms and signs involving the nervous system - Plan: PT plan of care cert/re-cert  Problem List Patient Active Problem List   Diagnosis Date Noted  . History of lacunar cerebrovascular accident (CVA) 02/12/2018  . Dizziness 02/12/2018  . Myofascial pain 12/18/2017  . Spondylosis without myelopathy or radiculopathy, lumbar region 12/18/2017  . Lumbar radiculopathy, right 12/18/2017  . Cholelithiasis 05/17/2017  . Sinus Bradycardia 05/17/2017  . Thoracic radiculopathy 04/12/2017  . Primary osteoarthritis of right knee 12/14/2016  . DDD (degenerative disc disease), lumbar 10/10/2016  . Lumbar facet arthropathy 08/29/2016  . Idiopathic scoliosis 03/22/2016  . Diabetic neuropathy ([place]) 03/22/2016  . Dysautonomia orthostatic hypotension syndrome ([place]) 02/25/2016  . CKD (chronic kidney disease), stage III ([place]) 02/19/2016  . Coronary artery disease involving coronary bypass graft of native heart without angina pectoris   . Diabetes mellitus type 2, diet-controlled ([place])   . 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 PVD (peripheral vascular disease) ([place]) 01/01/2014  . Mixed hyperlipidemia 04/11/2013  . Supine hypertension   . Vitamin D deficiency   . Vitamin B 12 deficiency   . Coronary atherosclerosis- s/p PCI to LAD in 2009 and PCI to RCA in 2011 02/27/2009  . Abdominal aortic aneurysm ([place]) 02/27/2009  . GERD 02/27/2009  . History of IBS 02/27/2009    Rexanne Mano, PT 04/16/2018, 10:00 PM  [facility] [address] [place], [place], [address] Phone: [phone]   Fax:  [phone]  Name: KYSHAUN BARNETTE MRN: [id number] Date of Birth: [birth year]

## 2018-04-17 ENCOUNTER — Other Ambulatory Visit: Payer: Self-pay | Admitting: Adult Health

## 2018-04-17 MED ORDER — ASPIRIN 81 MG PO TABS: 81.0000 mg | ORAL_TABLET | Freq: Every day | ORAL | 0 refills | Status: DC

## 2018-04-18 ENCOUNTER — Telehealth: Payer: Self-pay

## 2018-04-18 NOTE — Telephone Encounter (Signed)
Rn call wife to give results and JEssica NP recommendations. RN ask pt wife to please write down the results. Rn stated Please that his imaging did show some narrowing in his back arteries which could have contributed to his symptoms. Due to this, it is recommended to restart asprin 81mg  along with continuation of plavix for 4 weeks total and then continue plavix alone. Also continue the Repatha for cholesterol management. Recommend to recheck cholesterol levels in 6-8 weeks after starting Repatha.The wife stated pt has had two dosages of repatha and has to get a refill for January 2020. The wife verbalized all the recommend new change with adding aspirin 81mg  for weeks in addition to taking plavix. The wife verbalized understanding. ------

## 2018-04-18 NOTE — Telephone Encounter (Signed)
-----   Message from Dennis Poisson, NP sent at 04/17/2018  1:47 PM EST ----- Please advise patient that his imaging did show some narrowing in his back arteries which could have contributed to his symptoms. Due to this, it is recommended to restart asprin 81mg  along with continuation of plavix for 4 weeks total and then continue plavix alone. Also, please ensure he has started Repatha for cholesterol management. Recommend to recheck cholesterol levels in 6-8 weeks after starting Repatha.

## 2018-04-22 ENCOUNTER — Encounter: Payer: Self-pay | Admitting: Internal Medicine

## 2018-04-22 NOTE — Progress Notes (Signed)
  Subjective:    Patient ID: [name], male    DOB: June 20, 1933, 84 y.o.   MRN: [id number]  HPI   This nice 84 yo MWM with Dysautonomia and very labile BP's  ~  from severe supine Hypertension to Postural Hypotension returns today for continued close f/u trying to balance his Florinef with his antihypertensives.  He does endorse postural dizziness or light-headedness and does not endorse a typical spinning type of Vertigo or sense of dysequilibrium other than a sensation of near fain if he doesn't sit or lay down.     At last OV Nov 26, His postural BP's showed Sitting BP 209/124 and Standing BP 177/108 and he was advised to stop his Florinef & restart his Olmesartan.     Since last OV, his wife had to restart his Florinef because of severe postural drops in BP and had stopped his Olmesartan. Today his BP's are again very elevated.     He denies any current chest wall pains since a prednisone taper after his last OV.       10 point systems review negative except as above.    Objective:   Physical Exam  BP sitting-188/86  &   Standing -142/74  P 76   T 97.5 F   Ht 5\' 10"     Wt 193 lb )   BMI 27.75   Sitting BP 199/105    P 77        &        Standing BP 168/100       P  82  HEENT - WNL. Neck - supple.  Chest - Clear equal BS. Cor - Nl HS. RRR w/o sig MGR. PP 1(+). No edema. MS- FROM w/o deformities.  Gait Nl. Neuro -  Nl w/o focal abnormalities.    Assessment & Plan:   1. Labile hypertension  - Advised to taper his Florinef to 1 tablet qd and Restart Olmesartan 40 mg at 1/2 tablet  /daily & call (given my cell #) for any questions  - CBC with Differential/Platelet - COMPLETE METABOLIC PANEL WITH GFR - Magnesium  2. Dysautonomia orthostatic hypotension syndrome (HCC)  - COMPLETE METABOLIC PANEL WITH GFR  3. Medication management  - CBC with Differential/Platelet - COMPLETE METABOLIC PANEL WITH GFR - Magnesium

## 2018-04-23 ENCOUNTER — Ambulatory Visit: Payer: Medicare Other | Admitting: Internal Medicine

## 2018-04-23 VITALS — BP 188/86 | HR 76 | Temp 97.5°F | Ht 70.0 in | Wt 193.4 lb

## 2018-04-23 DIAGNOSIS — R0989 Other specified symptoms and signs involving the circulatory and respiratory systems: Secondary | ICD-10-CM | POA: Diagnosis not present

## 2018-04-23 DIAGNOSIS — G903 Multi-system degeneration of the autonomic nervous system: Secondary | ICD-10-CM

## 2018-04-23 DIAGNOSIS — Z79899 Other long term (current) drug therapy: Secondary | ICD-10-CM

## 2018-04-23 DIAGNOSIS — I951 Orthostatic hypotension: Secondary | ICD-10-CM

## 2018-04-24 ENCOUNTER — Encounter: Payer: Self-pay | Admitting: Physical Therapy

## 2018-04-24 ENCOUNTER — Ambulatory Visit: Payer: Medicare Other | Admitting: Physical Therapy

## 2018-04-24 ENCOUNTER — Encounter: Payer: Self-pay | Admitting: Internal Medicine

## 2018-04-24 DIAGNOSIS — R29818 Other symptoms and signs involving the nervous system: Secondary | ICD-10-CM | POA: Diagnosis not present

## 2018-04-24 DIAGNOSIS — R42 Dizziness and giddiness: Secondary | ICD-10-CM

## 2018-04-24 DIAGNOSIS — M6281 Muscle weakness (generalized): Secondary | ICD-10-CM | POA: Diagnosis not present

## 2018-04-24 DIAGNOSIS — R2689 Other abnormalities of gait and mobility: Secondary | ICD-10-CM | POA: Diagnosis not present

## 2018-04-24 LAB — COMPLETE METABOLIC PANEL WITH GFR
AG Ratio: 1.5 (calc) (ref 1.0–2.5)
ALT: 11 U/L (ref 9–46)
AST: 19 U/L (ref 10–35)
Albumin: 3.6 g/dL (ref 3.6–5.1)
Alkaline phosphatase (APISO): 71 U/L (ref 40–115)
BUN/Creatinine Ratio: 13 (calc) (ref 6–22)
BUN: 16 mg/dL (ref 7–25)
CO2: 34 mmol/L — ABNORMAL HIGH (ref 20–32)
Calcium: 9.6 mg/dL (ref 8.6–10.3)
Chloride: 103 mmol/L (ref 98–110)
Creat: 1.26 mg/dL — ABNORMAL HIGH (ref 0.70–1.11)
GFR, Est African American: 61 mL/min/{1.73_m2} (ref >=60)
GFR, Est Non African American: 53 mL/min/{1.73_m2} — ABNORMAL LOW (ref >=60)
Globulin: 2.4 g/dL (calc) (ref 1.9–3.7)
Glucose, Bld: 193 mg/dL — ABNORMAL HIGH (ref 65–99)
Potassium: 4.1 mmol/L (ref 3.5–5.3)
Sodium: 145 mmol/L (ref 135–146)
Total Bilirubin: 0.5 mg/dL (ref 0.2–1.2)
Total Protein: 6 g/dL — ABNORMAL LOW (ref 6.1–8.1)

## 2018-04-24 LAB — CBC WITH DIFFERENTIAL/PLATELET
Absolute Monocytes: 388 cells/uL (ref 200–950)
Basophils Absolute: 29 cells/uL (ref 0–200)
Basophils Relative: 0.5 %
Eosinophils Absolute: 479 cells/uL (ref 15–500)
Eosinophils Relative: 8.4 %
HCT: 38.3 % — ABNORMAL LOW (ref 38.5–50.0)
Hemoglobin: 12.6 g/dL — ABNORMAL LOW (ref 13.2–17.1)
Lymphs Abs: 1590 cells/uL (ref 850–3900)
MCH: 31.3 pg (ref 27.0–33.0)
MCHC: 32.9 g/dL (ref 32.0–36.0)
MCV: 95.3 fL (ref 80.0–100.0)
MPV: 9.6 fL (ref 7.5–12.5)
Monocytes Relative: 6.8 %
Neutro Abs: 3215 cells/uL (ref 1500–7800)
Neutrophils Relative %: 56.4 %
Platelets: 222 10*3/uL (ref 140–400)
RBC: 4.02 10*6/uL — ABNORMAL LOW (ref 4.20–5.80)
RDW: 13.6 % (ref 11.0–15.0)
Total Lymphocyte: 27.9 %
WBC: 5.7 10*3/uL (ref 3.8–10.8)

## 2018-04-24 LAB — MAGNESIUM: Magnesium: 2.3 mg/dL (ref 1.5–2.5)

## 2018-04-24 NOTE — Patient Instructions (Addendum)
Access Code: [email or web address]  URL: [email or web address]  Date: 04/24/2018  Prepared by: Barry Brunner   Exercises  Wide Stance with Eyes Closed - 3 reps - 1 sets - 30 seconds hold - 2x daily - 7x weekly  Standing Balance with Eyes Open - 10 reps - 1 sets - - hold - 2x daily - 7x weekly   Gaze Stabilization: Tip Card  1.Target must remain in focus, not blurry, and appear stationary while head is in motion. 2.Perform exercises with small head movements (30 to either side of midline). 3.Increase speed of head motion so long as target is in focus. 4.If you wear eyeglasses, be sure you can see target through lens (therapist will give specific instructions for bifocal / progressive lenses). 5.These exercises should provoke dizziness or nausea. Work through these symptoms. If too dizzy, slow head movement slightly. (Too dizzy is moderately dizzy or 5 out of 10 with a 10 meaning you're so dizzy you are falling).  Overall the symptoms of dizziness or nausea should resolve within 30 minutes of completing all repetitions. 6.Exercises demand concentration; avoid distractions. Begin with the target on a simple background unless instructed otherwise.      Special Instructions:  If symptoms are lasting longer than 30 minutes, modify your exercises by:    >decreasing the # of times you complete each activity >ensuring your symptoms return to baseline before moving onto the next exercise >dividing up exercises so you do not do them all in one session, but multiple short sessions throughout the day >doing them once a day until symptoms improve   Gaze Stabilization: Sitting    Keeping eyes on target on wall 3 feet away, and move head side to side for _30-60___ seconds. Rest and repeat side to side. Then repeat while moving head up and down for __30-60__ seconds. Rest and repeat up and down. (ie do each direction two times)  Do __2-3__ sessions per day.     Gaze Stabilization - Tip  Card  For safety, perform standing exercises close to a counter, wall, corner, or next to someone.   Gaze Stabilization - Standing Feet Apart   Feet shoulder width apart, keeping eyes on target on wall 3 feet away, tilt head down slightly and move head side to side for _30-60___ seconds. Rest and repeat side to side. Then repeat while moving head up and down for __30-60__ seconds. Rest and repeat up and down. (ie do each direction two times)  Do __2-3__ sessions per day.   Copyright  VHI. All rights reserved.

## 2018-04-24 NOTE — Therapy (Signed)
[facility] [address] [place], [place], [address] Phone: [phone]   Fax:  [phone]  Physical Therapy Treatment  Patient Details  Name: [name] MRN: [id number] Date of Birth: 1934/03/16 Referring Provider (PT): Venancio Poisson, NP   Encounter Date: 04/24/2018  PT End of Session - 04/24/18 1446    Visit Number  2    Number of Visits  17    Date for PT Re-Evaluation  05/16/18    Authorization Type  UHC Medicare    Authorization Time Period  04/16/18 to 07/15/2018    PT Start Time  0930    PT Stop Time  1018    PT Time Calculation (min)  48 min    Equipment Utilized During Treatment  Gait belt    Activity Tolerance  Patient tolerated treatment well    Behavior During Therapy  [facility] for tasks assessed/performed       Past Medical History:  Diagnosis Date  . 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 Colon polyps   . Coronary atherosclerosis of unspecified type of vessel, native or graft   . Diabetes ([name])   . Difficult intubation   . Esophageal reflux   . High cholesterol   . Hypertension    pt denies, he says he has a h/o hypotension. If BP up he adjusts the Florinef  . IBS (irritable bowel syndrome)   . Orthostatic hypotension    "BP has been dropping alot when I stand up for the last month or so" (02/17/2016)  . Vitamin B 12 deficiency   . Vitamin D deficiency     Past Surgical History:  Procedure Laterality Date  . BLADDER SURGERY  1969   traumatic pelvic fractures, urethral and bladder repair  . CARDIAC CATHETERIZATION N/A 07/01/2015   Procedure: Left Heart Cath and Coronary Angiography;  Surgeon: Wellington Hampshire, MD;  Location: [facility];  Service: Cardiovascular;  Laterality: N/A;  . COLON RESECTION N/A 05/17/2017   Procedure: DIAGNOSTIC LAPAROSCOPY,;  Surgeon: Leighton Ruff, MD;  Location[facility];  Service: General;  Laterality: N/A;  . CORONARY ANGIOPLASTY WITH STENT PLACEMENT     . CORONARY ARTERY BYPASS GRAFT N/A 07/06/2015   Procedure: CORONARY ARTERY BYPASS GRAFTING (CABG)x 4   utilizing the left internal mammary artery and endoscopically harvested bilateral  sapheneous vein.;  Surgeon: Ivin Poot, MD;  Location: [facility];  Service: Open Heart Surgery;  Laterality: N/A;  . KNEE SURGERY    . LOOP RECORDER INSERTION N/A 02/19/2018   Procedure: LOOP RECORDER INSERTION;  Surgeon: Deboraha Sprang, MD;  Location: [facility];  Service: Cardiovascular;  Laterality: N/A;  . TEE WITHOUT CARDIOVERSION N/A 07/06/2015   Procedure: TRANSESOPHAGEAL ECHOCARDIOGRAM (TEE);  Surgeon: Ivin Poot, MD;  Location: [facility];  Service: Open Heart Surgery;  Laterality: N/A;    There were no vitals filed for this visit.  Subjective Assessment - 04/24/18 0935    Subjective  Reports his dizziness is getting worse all the time. Continue to have difficulty describing, but best described by blurry vision. Reports someone told him to see an opthalmologist (he believes his regular eye exams are done by an optometrist).  Owns a RW but does not want to use it.   Patient is accompained by:  Family member   wife, [name]   Pertinent History  dysautonomia orthostatic hypotension, HTN, DDD, diabetic polyneuropathy, CKD stage 3; 01/24/18 acute right temporal CVA, Chronic bil basal  ganglia and cerebellar infarcts, Advanced atherosclerosis involving the posterior circulation, chronic rt chest pain s/p CABG 2017    Diagnostic tests  MRI 02/23/18 acute left splenium infarct ; MRI 01/24/18 acute right temporal CVA    Patient Stated Goals  to not feel dizzy all the time; to be able to work on his cars    Currently in Pain?  Yes    Pain Score  2     Pain Location  Neck    Pain Orientation  Right;Posterior    Pain Descriptors / Indicators  Headache    Pain Type  Chronic pain    Pain Onset  More than a month ago    Pain Frequency  Intermittent    Aggravating Factors   doing the supine ex's Dr Naaman Plummer gave  him (before he gets out of bed); [place], [place], piriformis     Pain Relieving Factors  heating pad, get up and get active                       [facility] Adult PT Treatment/Exercise - 04/24/18 1441      Transfers   Transfers  Sit to Stand;Stand to Sit    Sit to Stand  4: Min guard    Stand to Sit  4: Min guard      Ambulation/Gait   Ambulation/Gait Assistance  4: Min guard;4: Min assist    Ambulation/Gait Assistance Details  wide BOS with unsteadiness evident    Ambulation Distance (Feet)  80 Feet   30 x2   Assistive device  Straight cane    Gait Pattern  Step-through pattern;Decreased arm swing - left;Decreased trunk rotation;Trunk flexed;Narrow base of support;Wide base of support;Poor foot clearance - left;Poor foot clearance - right    Ambulation Surface  Level;Indoor      Self-Care   Self-Care  Other Self-Care Comments    Other Self-Care Comments   completed [place] with wife's assistance (due to decr vision) with pt score 72 (highly disabled due to dizziness)      Vestibular Treatment/Exercise - 04/24/18 0001      Vestibular Treatment/Exercise   Vestibular Treatment Provided  Gaze    Gaze Exercises  X1 Viewing Horizontal;X1 Viewing Vertical      X1 Viewing Horizontal   Foot Position  seated    Time  --   30 sec   Reps  2    Comments  +incr dizziness that subsided in 2-3 minutes      X1 Viewing Vertical   Foot Position  seated    Time  --   30 sec   Reps  2    Comments  incr dizziness more than horiz VOR x1; still easily subsided         Balance Exercises - 04/24/18 1442      Balance Exercises: Standing   Standing Eyes Opened  Narrow base of support (BOS);Wide (BOA);Head turns;Solid surface    Standing Eyes Closed  Wide (BOA);Narrow base of support (BOS);Solid surface    Partial Tandem Stance  Eyes open;Intermittent upper extremity support        PT Education - 04/24/18 1445    Education Details  additions to HEP; review of systems contributing to  balance as rationale for exercises; encouraged to GET AN OPTHALMOLOGIST APPT    Person(s) Educated  Patient;Spouse    Methods  Explanation;Demonstration;Verbal cues;Handout    Comprehension  Verbalized understanding;Returned demonstration;Verbal cues required;Need further instruction  PT Short Term Goals - 04/16/18 2140      PT SHORT TERM GOAL #1   Title  independent with initial HEP (Target for all STGs 05/16/2018)    Time  4    Period  Weeks    Status  New    Target Date  05/16/18      PT SHORT TERM GOAL #2   Title  Patient will ambulate with LRAD @ gait velocity of 2.62 ft/sec (indicative of velocity needed for safe community ambulation)    Baseline  04/16/18  2.16 ft/sec     Time  4    Period  Weeks    Status  New      PT SHORT TERM GOAL #3   Title  Pt will improve TUG score to <=17 sec demonstrating a lesser fall risk.     Baseline  12/16  TUG 20.88 sec    Time  4    Period  Weeks    Status  New        PT Long Term Goals - 04/24/18 1453      PT LONG TERM GOAL #1   Title  independent with updated HEP (Target all LTGs 06/15/2018)    Time  8    Period  Weeks    Status  New      PT LONG TERM GOAL #2   Title  Score on DHI will decr >=18 points    Baseline  04/24/18   72    Time  8    Period  Weeks    Status  On-going      PT LONG TERM GOAL #3   Title  Gait velocity will improve to >=2.8 ft/sec to demonstrate lesser fall risk.     Time  8    Period  Weeks    Status  New      PT LONG TERM GOAL #4   Title  Pt will perform TUG in <=15 sec    Time  8    Period  Weeks    Status  New            Plan - 04/24/18 1447    Clinical Impression Statement  Reviewed CT angio results (done since PT eval) with noted stenosis of posterior circulation. Session focused on addressing patient's questions re: role of PT and how it may benefit him (he is very willing, and had questions). Initiated patient's HEP for balance with pt safely progressed through corner  exercises (EO, EC, feet narrowing, head turns) to establish the safe, appropriate challenge for him to do at home. Wife present, very attentive and verbalized understanding of all exercises. Discussed revsing the exercises Dr. Naaman Plummer gave him for his back as they aggravate his neck and posterior scalp pain.     Rehab Potential  Good    Clinical Impairments Affecting Rehab Potential  dizziness present >2 yrs    PT Frequency  2x / week    PT Duration  8 weeks    PT Treatment/Interventions  ADLs/Self Care Home Management;Canalith Repostioning;DME Instruction;Gait training;Stair training;Functional mobility training;Therapeutic activities;Therapeutic exercise;Balance training;Patient/family education;Neuromuscular re-education;Manual techniques;Vestibular;Visual/perceptual remediation/compensation    PT Next Visit Plan  check VORx1 and if issues with HEP started 12/24; ?add habituation activity to HEP; work on hip and step strategy; continue to challenge vestibular system    Consulted and Agree with Plan of Care  Patient;Family member/caregiver    Family Member Consulted  wife, [name]  Patient will benefit from skilled therapeutic intervention in order to improve the following deficits and impairments:  Abnormal gait, Cardiopulmonary status limiting activity, Decreased activity tolerance, Decreased balance, Decreased knowledge of use of DME, Decreased mobility, Decreased safety awareness, Dizziness, Impaired perceived functional ability, Impaired vision/preception, Impaired sensation, Postural dysfunction  Visit Diagnosis: Dizziness and giddiness  Other symptoms and signs involving the nervous system     Problem List Patient Active Problem List   Diagnosis Date Noted  . History of lacunar cerebrovascular accident (CVA) 02/12/2018  . Dizziness 02/12/2018  . Myofascial pain 12/18/2017  . Spondylosis without myelopathy or radiculopathy, lumbar region 12/18/2017  . Lumbar radiculopathy,  right 12/18/2017  . Cholelithiasis 05/17/2017  . Sinus Bradycardia 05/17/2017  . Thoracic radiculopathy 04/12/2017  . Primary osteoarthritis of right knee 12/14/2016  . DDD (degenerative disc disease), lumbar 10/10/2016  . Lumbar facet arthropathy 08/29/2016  . Idiopathic scoliosis 03/22/2016  . Diabetic neuropathy ([place]) 03/22/2016  . Dysautonomia orthostatic hypotension syndrome ([place]) 02/25/2016  . CKD (chronic kidney disease), stage III ([place]) 02/19/2016  . Coronary artery disease involving coronary bypass graft of native heart without angina pectoris   . Diabetes mellitus type 2, diet-controlled ([place])   . 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 PVD (peripheral vascular disease) ([place]) 01/01/2014  . Mixed hyperlipidemia 04/11/2013  . Supine hypertension   . Vitamin D deficiency   . Vitamin B 12 deficiency   . Coronary atherosclerosis- s/p PCI to LAD in 2009 and PCI to RCA in 2011 02/27/2009  . Abdominal aortic aneurysm ([place]) 02/27/2009  . GERD 02/27/2009  . History of IBS 02/27/2009    Rexanne Mano, PT 04/24/2018, 2:57 PM  [facility] [address] [place], [place], [address] Phone: [phone]   Fax:  [phone]  Name: KILLIAN SCHWER MRN: [id number] Date of Birth: 1934-03-31

## 2018-04-26 ENCOUNTER — Telehealth: Payer: Self-pay | Admitting: *Deleted

## 2018-04-26 ENCOUNTER — Ambulatory Visit (INDEPENDENT_AMBULATORY_CARE_PROVIDER_SITE_OTHER): Payer: Medicare Other

## 2018-04-26 ENCOUNTER — Encounter: Payer: Self-pay | Admitting: Cardiology

## 2018-04-26 DIAGNOSIS — I639 Cerebral infarction, unspecified: Secondary | ICD-10-CM | POA: Diagnosis not present

## 2018-04-26 LAB — CUP PACEART REMOTE DEVICE CHECK
Date Time Interrogation Session: 20191226124044
Implantable Pulse Generator Implant Date: 20191021

## 2018-04-26 NOTE — Telephone Encounter (Signed)
Spoke w/ pt and his wife. Instructed them how to send a manual transmission. Transmission received.

## 2018-04-26 NOTE — Progress Notes (Signed)
Carelink Summary Report / Loop Recorder 

## 2018-04-26 NOTE — Telephone Encounter (Signed)
Received alert for 4 "tachy" episodes and 2 "AF" episodes. Will review full report when received.

## 2018-04-26 NOTE — Telephone Encounter (Signed)
Left message for patient to send manual transmission for discernment of episodes on LINQ from 04/23/18.

## 2018-04-27 ENCOUNTER — Other Ambulatory Visit: Payer: Self-pay | Admitting: Internal Medicine

## 2018-04-27 ENCOUNTER — Encounter: Payer: Self-pay | Admitting: Physical Therapy

## 2018-04-27 ENCOUNTER — Ambulatory Visit: Payer: Medicare Other | Admitting: Physical Therapy

## 2018-04-27 DIAGNOSIS — M6281 Muscle weakness (generalized): Secondary | ICD-10-CM | POA: Diagnosis not present

## 2018-04-27 DIAGNOSIS — R42 Dizziness and giddiness: Secondary | ICD-10-CM

## 2018-04-27 DIAGNOSIS — R29818 Other symptoms and signs involving the nervous system: Secondary | ICD-10-CM | POA: Diagnosis not present

## 2018-04-27 DIAGNOSIS — R2689 Other abnormalities of gait and mobility: Secondary | ICD-10-CM

## 2018-04-27 NOTE — Therapy (Signed)
[facility] [address] [place], [place], [address] Phone: [phone]   Fax:  [phone]  Physical Therapy Treatment  Patient Details  Name: [name] MRN: [id number] Date of Birth: 12/15/1933 Referring Provider (PT): Venancio Poisson, NP   Encounter Date: 04/27/2018  PT End of Session - 04/27/18 1301    Visit Number  3    Number of Visits  17    Date for PT Re-Evaluation  05/16/18    Authorization Type  UHC Medicare    Authorization Time Period  04/16/18 to 07/15/2018    PT Start Time  0933    PT Stop Time  1015    PT Time Calculation (min)  42 min    Equipment Utilized During Treatment  Gait belt    Activity Tolerance  Patient tolerated treatment well    Behavior During Therapy  [facility] for tasks assessed/performed       Past Medical History:  Diagnosis Date  . 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 Colon polyps   . Coronary atherosclerosis of unspecified type of vessel, native or graft   . Diabetes ([place])   . Difficult intubation   . Esophageal reflux   . High cholesterol   . Hypertension    pt denies, he says he has a h/o hypotension. If BP up he adjusts the Florinef  . IBS (irritable bowel syndrome)   . Orthostatic hypotension    "BP has been dropping alot when I stand up for the last month or so" (02/17/2016)  . Vitamin B 12 deficiency   . Vitamin D deficiency     Past Surgical History:  Procedure Laterality Date  . BLADDER SURGERY  1969   traumatic pelvic fractures, urethral and bladder repair  . CARDIAC CATHETERIZATION N/A 07/01/2015   Procedure: Left Heart Cath and Coronary Angiography;  Surgeon: Wellington Hampshire, MD;  Location: [facility];  Service: Cardiovascular;  Laterality: N/A;  . COLON RESECTION N/A 05/17/2017   Procedure: DIAGNOSTIC LAPAROSCOPY,;  Surgeon: Leighton Ruff, MD;  [facility];  Service: General;  Laterality: N/A;  . CORONARY ANGIOPLASTY WITH STENT PLACEMENT     . CORONARY ARTERY BYPASS GRAFT N/A 07/06/2015   Procedure: CORONARY ARTERY BYPASS GRAFTING (CABG)x 4   utilizing the left internal mammary artery and endoscopically harvested bilateral  sapheneous vein.;  Surgeon: Ivin Poot, MD;  Location: [facility];  Service: Open Heart Surgery;  Laterality: N/A;  . KNEE SURGERY    . LOOP RECORDER INSERTION N/A 02/19/2018   Procedure: LOOP RECORDER INSERTION;  Surgeon: Deboraha Sprang, MD;  Location: [facility];  Service: Cardiovascular;  Laterality: N/A;  . TEE WITHOUT CARDIOVERSION N/A 07/06/2015   Procedure: TRANSESOPHAGEAL ECHOCARDIOGRAM (TEE);  Surgeon: Ivin Poot, MD;  Location: [facility];  Service: Open Heart Surgery;  Laterality: N/A;    There were no vitals filed for this visit.  Subjective Assessment - 04/27/18 0938    Subjective  Feels like his right knee is giving way.  Is weating a sleeve and it feels better but still feels like it might give way.  Dizziness might be a little better.  Has been doing the exercises.    Patient is accompained by:  Family member   wife, [name]   Pertinent History  dysautonomia orthostatic hypotension, HTN, DDD, diabetic polyneuropathy, CKD stage 3; 01/24/18 acute right temporal CVA, Chronic bil basal ganglia and cerebellar infarcts, Advanced atherosclerosis involving the posterior circulation, chronic  rt chest pain s/p CABG 2017    Diagnostic tests  MRI 02/23/18 acute left splenium infarct ; MRI 01/24/18 acute right temporal CVA    Patient Stated Goals  to not feel dizzy all the time; to be able to work on his cars    Currently in Pain?  Yes    Pain Score  4     Pain Location  Neck    Pain Orientation  Right;Posterior    Pain Descriptors / Indicators  Headache    Pain Type  Chronic pain    Pain Frequency  Intermittent    Aggravating Factors   When he lays on his back.    Pain Relieving Factors  getting up and active, heating pad    Multiple Pain Sites  Yes    Pain Score  0   8 when walking but 0 at rest    Pain Location  Knee    Pain Orientation  Right    Pain Descriptors / Indicators  Sharp    Pain Type  Acute pain    Pain Onset  In the past 7 days    Pain Frequency  Other (Comment)   when walking   Aggravating Factors   walking    Pain Relieving Factors  rest        [facility] Adult PT Treatment/Exercise - 04/27/18 0001      Knee/Hip Exercises: Aerobic   Other Aerobic  [place] level 2.0 all 4 extremities x 5 minutes.       Balance Exercises - 04/27/18 1258      Balance Exercises: Standing   Standing Eyes Opened  Narrow base of support (BOS);Wide (BOA);Head turns;Solid surface;Foam/compliant surface;20 secs;5 reps    Standing Eyes Closed  Wide (BOA);Narrow base of support (BOS);Solid surface;Foam/compliant surface;5 reps;10 secs        PT Education - 04/27/18 1300    Education Details  additions to HEP, reviewed previous HEP, holding on [employer] until assess endurance here in clinic and monitor vitals    Person(s) Educated  Patient;Spouse    Methods  Explanation;Demonstration;Verbal cues;Handout    Comprehension  Verbalized understanding;Returned demonstration       PT Short Term Goals - 04/16/18 2140      PT SHORT TERM GOAL #1   Title  independent with initial HEP (Target for all STGs 05/16/2018)    Time  4    Period  Weeks    Status  New    Target Date  05/16/18      PT SHORT TERM GOAL #2   Title  Patient will ambulate with LRAD @ gait velocity of 2.62 ft/sec (indicative of velocity needed for safe community ambulation)    Baseline  04/16/18  2.16 ft/sec     Time  4    Period  Weeks    Status  New      PT SHORT TERM GOAL #3   Title  Pt will improve TUG score to <=17 sec demonstrating a lesser fall risk.     Baseline  12/16  TUG 20.88 sec    Time  4    Period  Weeks    Status  New        PT Long Term Goals - 04/24/18 1453      PT LONG TERM GOAL #1   Title  independent with updated HEP (Target all LTGs 06/15/2018)    Time  8    Period  Weeks    Status  New  PT LONG TERM GOAL #2   Title  Score on DHI will decr >=18 points    Baseline  04/24/18   72    Time  8    Period  Weeks    Status  On-going      PT LONG TERM GOAL #3   Title  Gait velocity will improve to >=2.8 ft/sec to demonstrate lesser fall risk.     Time  8    Period  Weeks    Status  New      PT LONG TERM GOAL #4   Title  Pt will perform TUG in <=15 sec    Time  8    Period  Weeks    Status  New            Plan - 04/27/18 1302    Clinical Impression Statement  Skilled session focused on balance, reviewing HEP and initiating exercises to simulate gym environment.  Pt asking about going to [facility].  Discussed trial of activities here in clinic before initiating exercise program at [facility].  Pt with improvement in corner exercises today and added feet together eyes closed.  Continue PT per POC.    Rehab Potential  Good    Clinical Impairments Affecting Rehab Potential  dizziness present >2 yrs    PT Frequency  2x / week    PT Duration  8 weeks    PT Treatment/Interventions  ADLs/Self Care Home Management;Canalith Repostioning;DME Instruction;Gait training;Stair training;Functional mobility training;Therapeutic activities;Therapeutic exercise;Balance training;Patient/family education;Neuromuscular re-education;Manual techniques;Vestibular;Visual/perceptual remediation/compensation    PT Next Visit Plan  [place] to assess for ability to join [employer](pt wants to use recumbent bike at [facility]) and monitor vitals, ?add habituation activity to HEP; work on hip and step strategy; continue to challenge vestibular system    Consulted and Agree with Plan of Care  Patient;Family member/caregiver    Family Member Consulted  wife, [name]       Patient will benefit from skilled therapeutic intervention in order to improve the following deficits and impairments:  Abnormal gait, Cardiopulmonary status limiting activity, Decreased activity tolerance, Decreased balance, Decreased knowledge of use of  DME, Decreased mobility, Decreased safety awareness, Dizziness, Impaired perceived functional ability, Impaired vision/preception, Impaired sensation, Postural dysfunction  Visit Diagnosis: Dizziness and giddiness  Other symptoms and signs involving the nervous system  Other abnormalities of gait and mobility  Muscle weakness (generalized)     Problem List Patient Active Problem List   Diagnosis Date Noted  . History of lacunar cerebrovascular accident (CVA) 02/12/2018  . Dizziness 02/12/2018  . Myofascial pain 12/18/2017  . Spondylosis without myelopathy or radiculopathy, lumbar region 12/18/2017  . Lumbar radiculopathy, right 12/18/2017  . Cholelithiasis 05/17/2017  . Sinus Bradycardia 05/17/2017  . Thoracic radiculopathy 04/12/2017  . Primary osteoarthritis of right knee 12/14/2016  . DDD (degenerative disc disease), lumbar 10/10/2016  . Lumbar facet arthropathy 08/29/2016  . Idiopathic scoliosis 03/22/2016  . Diabetic neuropathy ([place]) 03/22/2016  . Dysautonomia orthostatic hypotension syndrome ([place]) 02/25/2016  . CKD (chronic kidney disease), stage III ([place]) 02/19/2016  . Coronary artery disease involving coronary bypass graft of native heart without angina pectoris   . Diabetes mellitus type 2, diet-controlled ([place])   . 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 PVD (peripheral vascular disease) ([place]) 01/01/2014  . Mixed hyperlipidemia 04/11/2013  . Supine hypertension   . Vitamin D deficiency   . Vitamin B 12 deficiency   . Coronary atherosclerosis- s/p PCI to LAD  in 2009 and PCI to RCA in 2011 02/27/2009  . Abdominal aortic aneurysm ([place]) 02/27/2009  . GERD 02/27/2009  . History of IBS 02/27/2009    Narda Bonds, PTA [place] 04/27/18 1:07 PM Phone: [phone] Fax: [facility] [address] [place], [place],  [address] Phone: [phone]   Fax:  [phone]  Name: [name] MRN: [id number] Date of Birth: July 23, 1933

## 2018-04-27 NOTE — Patient Instructions (Signed)
Access Code: [email or web address]  URL: [email or web address]  Date: 04/27/2018  Prepared by: Nita Sells   Exercises  Wide Stance with Eyes Closed - 3 reps - 1 sets - 30 seconds hold - 2x daily - 7x weekly  Standing Balance with Eyes Open - 10 reps - 1 sets - - hold - 2x daily - 7x weekly  [place]berg Stance with Eyes Closed - 10 reps - 1 sets - 1x daily - 7x weekly

## 2018-04-30 ENCOUNTER — Encounter: Payer: Medicare Other | Attending: Physical Medicine & Rehabilitation | Admitting: Physical Medicine & Rehabilitation

## 2018-04-30 ENCOUNTER — Encounter: Payer: Self-pay | Admitting: Physical Medicine & Rehabilitation

## 2018-04-30 ENCOUNTER — Other Ambulatory Visit: Payer: Self-pay | Admitting: *Deleted

## 2018-04-30 VITALS — BP 188/98 | HR 78 | Ht 72.0 in | Wt 190.0 lb

## 2018-04-30 DIAGNOSIS — K219 Gastro-esophageal reflux disease without esophagitis: Secondary | ICD-10-CM | POA: Insufficient documentation

## 2018-04-30 DIAGNOSIS — G588 Other specified mononeuropathies: Secondary | ICD-10-CM | POA: Diagnosis not present

## 2018-04-30 DIAGNOSIS — G8929 Other chronic pain: Secondary | ICD-10-CM | POA: Insufficient documentation

## 2018-04-30 DIAGNOSIS — M47816 Spondylosis without myelopathy or radiculopathy, lumbar region: Secondary | ICD-10-CM

## 2018-04-30 DIAGNOSIS — I2510 Atherosclerotic heart disease of native coronary artery without angina pectoris: Secondary | ICD-10-CM | POA: Diagnosis not present

## 2018-04-30 DIAGNOSIS — Z79899 Other long term (current) drug therapy: Secondary | ICD-10-CM | POA: Insufficient documentation

## 2018-04-30 DIAGNOSIS — M545 Low back pain: Secondary | ICD-10-CM | POA: Diagnosis not present

## 2018-04-30 DIAGNOSIS — M4185 Other forms of scoliosis, thoracolumbar region: Secondary | ICD-10-CM | POA: Diagnosis not present

## 2018-04-30 DIAGNOSIS — K589 Irritable bowel syndrome without diarrhea: Secondary | ICD-10-CM | POA: Insufficient documentation

## 2018-04-30 DIAGNOSIS — Z87891 Personal history of nicotine dependence: Secondary | ICD-10-CM | POA: Insufficient documentation

## 2018-04-30 DIAGNOSIS — Z8249 Family history of ischemic heart disease and other diseases of the circulatory system: Secondary | ICD-10-CM | POA: Insufficient documentation

## 2018-04-30 DIAGNOSIS — E785 Hyperlipidemia, unspecified: Secondary | ICD-10-CM | POA: Diagnosis not present

## 2018-04-30 DIAGNOSIS — E559 Vitamin D deficiency, unspecified: Secondary | ICD-10-CM | POA: Diagnosis not present

## 2018-04-30 DIAGNOSIS — E1142 Type 2 diabetes mellitus with diabetic polyneuropathy: Secondary | ICD-10-CM | POA: Diagnosis not present

## 2018-04-30 DIAGNOSIS — Z955 Presence of coronary angioplasty implant and graft: Secondary | ICD-10-CM | POA: Insufficient documentation

## 2018-04-30 DIAGNOSIS — M5414 Radiculopathy, thoracic region: Secondary | ICD-10-CM

## 2018-04-30 DIAGNOSIS — R0789 Other chest pain: Secondary | ICD-10-CM | POA: Insufficient documentation

## 2018-04-30 DIAGNOSIS — Z951 Presence of aortocoronary bypass graft: Secondary | ICD-10-CM | POA: Diagnosis not present

## 2018-04-30 DIAGNOSIS — I10 Essential (primary) hypertension: Secondary | ICD-10-CM | POA: Diagnosis not present

## 2018-04-30 MED ORDER — METHOCARBAMOL 500 MG PO TABS: 250.0000 mg | ORAL_TABLET | Freq: Four times a day (QID) | ORAL | 1 refills | Status: DC | PRN

## 2018-04-30 MED ORDER — ONETOUCH ULTRA 2 W/DEVICE KIT: PACK | 0 refills | Status: DC

## 2018-04-30 NOTE — Progress Notes (Signed)
Subjective:    Patient ID: [name], male    DOB: [birth year], 84 y.o.   MRN: [id number]  HPI   [name] is here in follow up of his chronic pain. He is having more pain in his upper back which is radiating around into his chest.  The pain seems to be more his left side than right.  Its similar pain to that which we have treated before.  He responded initially to intercostal blocks but more recently has not.  Low back bothers him as well.  He struggles with pain in his right knee also.  He is using Voltaren gel inconsistently because it does not help a great deal for his chest.  He does find it helps his feet and knees.  He takes gabapentin 200 mg twice daily.  He is now no longer on anything topical for pain other than Voltaren.  He has been going to neuro rehab for vestibular therapies in relation to his posterior circulation strokes.  Continues to have struggles with his balance and oculovestibular function.   Pain Inventory Average Pain 6 Pain Right Now 6 My pain is sharp  In the last 24 hours, has pain interfered with the following? General activity 5 Relation with others 5 Enjoyment of life 8 What TIME of day is your pain at its worst? morning, evening  Sleep (in general) Fair  Pain is worse with: standing Pain improves with: rest Relief from Meds: 0  Mobility walk with assistance use a cane ability to climb steps?  yes do you drive?  no transfers alone  Function retired  Neuro/Psych numbness dizziness  Prior Studies Any changes since last visit?  no  Physicians involved in your care Any changes since last visit?  no   Family History  Problem Relation Age of Onset  . Heart attack Father        died age 72  . Heart attack Brother        died age 5  . Anuerysm Brother        aortic  . Heart attack Sister        died age 30  . Colon cancer Sister   . Liver cancer Sister   . Diabetes Maternal Grandmother   . Colon polyps Sister        and  brothers x 2    Social History   Socioeconomic History  . Marital status: Married    Spouse name: Not on file  . Number of children: 2  . Years of education: Not on file  . Highest education level: High school graduate  Occupational History  . Occupation: Retired  Scientific laboratory technician  . Financial resource strain: Not on file  . Food insecurity:    Worry: Not on file    Inability: Not on file  . Transportation needs:    Medical: Not on file    Non-medical: Not on file  Tobacco Use  . Smoking status: Former Smoker    Last attempt to quit: 07/16/1963    Years since quitting: 54.8  . Smokeless tobacco: Former Systems developer    Types: Chew    Quit date: 1989  Substance and Sexual Activity  . Alcohol use: No  . Drug use: No  . Sexual activity: Not Currently  Lifestyle  . Physical activity:    Days per week: Not on file    Minutes per session: Not on file  . Stress: Not on file  Relationships  . Social  connections:    Talks on phone: Not on file    Gets together: Not on file    Attends religious service: Not on file    Active member of club or organization: Not on file    Attends meetings of clubs or organizations: Not on file    Relationship status: Not on file  Other Topics Concern  . Not on file  Social History Narrative   Lives at home with his wife [name]   Right handed   No caffeine   Past Surgical History:  Procedure Laterality Date  . BLADDER SURGERY  1969   traumatic pelvic fractures, urethral and bladder repair  . CARDIAC CATHETERIZATION N/A 07/01/2015   Procedure: Left Heart Cath and Coronary Angiography;  Surgeon: Wellington Hampshire, MD;  Location: [facility];  Service: Cardiovascular;  Laterality: N/A;  . COLON RESECTION N/A 05/17/2017   Procedure: DIAGNOSTIC LAPAROSCOPY,;  Surgeon: Leighton Ruff, MD;  Location: [facility];  Service: General;  Laterality: N/A;  . CORONARY ANGIOPLASTY WITH STENT PLACEMENT    . CORONARY ARTERY BYPASS GRAFT N/A 07/06/2015   Procedure:  CORONARY ARTERY BYPASS GRAFTING (CABG)x 4   utilizing the left internal mammary artery and endoscopically harvested bilateral  sapheneous vein.;  Surgeon: Ivin Poot, MD;  Location: [employer] [facility];  Service: Open Heart Surgery;  Laterality: N/A;  . KNEE SURGERY    . LOOP RECORDER INSERTION N/A 02/19/2018   Procedure: LOOP RECORDER INSERTION;  Surgeon: Deboraha Sprang, MD;  Location: [facility];  Service: Cardiovascular;  Laterality: N/A;  . TEE WITHOUT CARDIOVERSION N/A 07/06/2015   Procedure: TRANSESOPHAGEAL ECHOCARDIOGRAM (TEE);  Surgeon: Ivin Poot, MD;  Location: [facility];  Service: Open Heart Surgery;  Laterality: N/A;   Past Medical History:  Diagnosis Date  . 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 Colon polyps   . Coronary atherosclerosis of unspecified type of vessel, native or graft   . Diabetes ([place])   . Difficult intubation   . Esophageal reflux   . High cholesterol   . Hypertension    pt denies, he says he has a h/o hypotension. If BP up he adjusts the Florinef  . IBS (irritable bowel syndrome)   . Orthostatic hypotension    "BP has been dropping alot when I stand up for the last month or so" (02/17/2016)  . Vitamin B 12 deficiency   . Vitamin D deficiency    BP (!) 188/98   Pulse 78   Ht 6' (1.829 m)   Wt 190 lb (86.2 kg)   SpO2 96%   BMI 25.77 kg/m   Opioid Risk Score:   Fall Risk Score:  `1  Depression screen PHQ 2/9  Depression screen [facility] 2/9 04/24/2018 04/11/2018 03/27/2018 03/20/2018 02/04/2018 01/18/2018 12/18/2017  Decreased Interest 0 0 0 0 0 0 0  Down, Depressed, Hopeless 0 0 0 0 0 0 0  PHQ - 2 Score 0 0 0 0 0 0 0  Altered sleeping - - - - - - -  Tired, decreased energy - - - - - - -  Change in appetite - - - - - - -  Feeling bad or failure about yourself  - - - - - - -  Trouble concentrating - - - - - - -  Moving slowly or fidgety/restless - - - - - - -  Suicidal thoughts - - - - - - -  PHQ-9 Score - - - - - - -  Difficult doing work/chores - - - -  - - -  Some recent data might be hidden    Review of Systems  Constitutional: Positive for diaphoresis.  HENT: Negative.   Eyes: Negative.   Respiratory: Negative.   Cardiovascular: Negative.   Gastrointestinal: Negative.   Endocrine: Negative.   Genitourinary: Negative.   Musculoskeletal: Positive for arthralgias, back pain and gait problem.  Skin: Negative.   Allergic/Immunologic: Negative.   Neurological: Positive for dizziness.  Hematological: Negative.   Psychiatric/Behavioral: Negative.   All other systems reviewed and are negative.      Objective:   Physical Exam General: No acute distress General: No acute distress HEENT: EOMI, oral membranes moist Cards: reg rate  Chest: normal effort Abdomen: Soft, NT, ND Skin: dry, intact Extremities: no edema  Neuro: Cognitively he is within baseline..  Strength remains grossly 5 out of 5.  He is still struggling with balance and uses his cane for support.  Ambulation is slow.  He needs visual and tactile support for balance. Musculoskeletal:lumbarlevoscoliosis still is present.  He has pain with extension and flexion.  He needs extra time to move from sit to stand.  He has tenderness with palpation along the lower 3-4 ribs.  Left ribs 8 9 and 10 appear to be most tender along the axillary line.  Is also some tenderness along his spine at these levels left more than right.  Ribs bother him more when he takes a deep breath as well is when he changes positions. Psych:pleasantand appropriate        1. Chronic left and right chest wall pain, intercostal neuritis/pain, ribs 6-10 at various. Likely triggered by CABG.  2. Chronic low back pain, thoracolumbar levoscoliosis.Most recent lumbar MRI reveals critical DDD at L3-L5 and foraminal stenosis at these levels. Good results with recent right L4-5 T-LESI. Had equivocal results with medial branch blocks in the past. Presentation at this point is more consistent with a  facet arthropathy however. 3. Diabetic polyneuropathy with ?autonomic dysfunction, hypotension 4. Right knee pain. Likely old meniscal injury/osteoarthritis--increased pain today 5.  Recent right temporal stem infarct.  Patient with increased dizziness and balance deficits.   Plan: 1.S/P right L4-5 ESI per Dr. Letta Pate 9/19 with transient relief.  There are still some unrealistic expectations re: pain    -Mobility complicated by his recent strokes. 2.Maintain gabapentin 200mg twice daily.  3. Voltaren gel for rib cage and knees/feet.  Also suggested icy hot patches with lidocaine to ribs for a solid week. 4. Provided patient exercises for his intercostal muscles and rib cage.  Might benefit from formal therapy for this although he has enough on his hands right now in regards to his vestibular therapy.   5. Trial of Robaxin 250 to 500 mg every 6 hours as needed for muscle spasms  6. Follow up in about  36months. Fifteen minutes of face to face patient care time were spent during this visit. All questions were encouraged and answered.  Marland Kitchen

## 2018-05-01 ENCOUNTER — Encounter: Payer: Self-pay | Admitting: Physical Therapy

## 2018-05-01 ENCOUNTER — Ambulatory Visit: Payer: Medicare Other | Admitting: Physical Therapy

## 2018-05-01 VITALS — BP 111/70 | HR 82

## 2018-05-01 DIAGNOSIS — R2689 Other abnormalities of gait and mobility: Secondary | ICD-10-CM

## 2018-05-01 DIAGNOSIS — R42 Dizziness and giddiness: Secondary | ICD-10-CM | POA: Diagnosis not present

## 2018-05-01 DIAGNOSIS — R29818 Other symptoms and signs involving the nervous system: Secondary | ICD-10-CM

## 2018-05-01 DIAGNOSIS — M6281 Muscle weakness (generalized): Secondary | ICD-10-CM | POA: Diagnosis not present

## 2018-05-01 NOTE — Therapy (Signed)
[facility] [address] [place], [place], [address] Phone: [phone]   Fax:  [phone]  Physical Therapy Treatment  Patient Details  Name: [name] MRN: [id number] Date of Birth: May 09, 1933 Referring Provider (PT): Venancio Poisson, NP   Encounter Date: 05/01/2018  PT End of Session - 05/01/18 1306    Visit Number  4    Number of Visits  17    Date for PT Re-Evaluation  05/16/18    Authorization Type  [employer] Medicare    Authorization Time Period  04/16/18 to 07/15/2018    PT Start Time  0934    PT Stop Time  1015    PT Time Calculation (min)  41 min    Equipment Utilized During Treatment  Gait belt    Activity Tolerance  Patient tolerated treatment well    Behavior During Therapy  [facility] for tasks assessed/performed       Past Medical History:  Diagnosis Date  . 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 Colon polyps   . Coronary atherosclerosis of unspecified type of vessel, native or graft   . Diabetes ([place])   . Difficult intubation   . Esophageal reflux   . High cholesterol   . Hypertension    pt denies, he says he has a h/o hypotension. If BP up he adjusts the Florinef  . IBS (irritable bowel syndrome)   . Orthostatic hypotension    "BP has been dropping alot when I stand up for the last month or so" (02/17/2016)  . Vitamin B 12 deficiency   . Vitamin D deficiency     Past Surgical History:  Procedure Laterality Date  . BLADDER SURGERY  1969   traumatic pelvic fractures, urethral and bladder repair  . CARDIAC CATHETERIZATION N/A 07/01/2015   Procedure: Left Heart Cath and Coronary Angiography;  Surgeon: Wellington Hampshire, MD;  Location: [facility];  Service: Cardiovascular;  Laterality: N/A;  . COLON RESECTION N/A 05/17/2017   Procedure: DIAGNOSTIC LAPAROSCOPY,;  Surgeon: Leighton Ruff, MD;  [facility];  Service: General;  Laterality: N/A;  . CORONARY ANGIOPLASTY WITH STENT PLACEMENT     . CORONARY ARTERY BYPASS GRAFT N/A 07/06/2015   Procedure: CORONARY ARTERY BYPASS GRAFTING (CABG)x 4   utilizing the left internal mammary artery and endoscopically harvested bilateral  sapheneous vein.;  Surgeon: Ivin Poot, MD;  Location: [facility];  Service: Open Heart Surgery;  Laterality: N/A;  . KNEE SURGERY    . LOOP RECORDER INSERTION N/A 02/19/2018   Procedure: LOOP RECORDER INSERTION;  Surgeon: Deboraha Sprang, MD;  Location: [facility];  Service: Cardiovascular;  Laterality: N/A;  . TEE WITHOUT CARDIOVERSION N/A 07/06/2015   Procedure: TRANSESOPHAGEAL ECHOCARDIOGRAM (TEE);  Surgeon: Ivin Poot, MD;  Location: [facility];  Service: Open Heart Surgery;  Laterality: N/A;    Vitals:   05/01/18 0941  BP: 111/70  Pulse: 82    Subjective Assessment - 05/01/18 0941    Subjective  Still dizzy.  Yesterday was worse but still did his exercises.    Patient is accompained by:  Family member   wife, [name]   Pertinent History  dysautonomia orthostatic hypotension, HTN, DDD, diabetic polyneuropathy, CKD stage 3; 01/24/18 acute right temporal CVA, Chronic bil basal ganglia and cerebellar infarcts, Advanced atherosclerosis involving the posterior circulation, chronic rt chest pain s/p CABG 2017    Diagnostic tests  MRI 02/23/18 acute left splenium infarct ; MRI 01/24/18 acute  right temporal CVA    Patient Stated Goals  to not feel dizzy all the time; to be able to work on his cars    Currently in Pain?  Yes    Pain Score  3     Pain Location  Back    Pain Orientation  Mid    Pain Descriptors / Indicators  Aching    Pain Type  Chronic pain    Pain Onset  More than a month ago    Pain Frequency  Constant    Aggravating Factors   sitting in hard back chair    Pain Relieving Factors  pills and patch    Multiple Pain Sites  No        [facility] Adult PT Treatment/Exercise - 05/01/18 0001      Transfers   Transfers  Sit to Stand;Stand to Sit    Sit to Stand  5: Supervision    Stand to  Sit  5: Supervision      Knee/Hip Exercises: Aerobic   Other Aerobic  Scifit level 2.0 all 4 extremities x 5 minutes.       Balance Exercises - 05/01/18 1303      Balance Exercises: Standing   Standing Eyes Opened  Narrow base of support (BOS);Wide (BOA);Head turns;Foam/compliant surface;Solid surface;5 reps;20 secs   in corner   Standing Eyes Closed  Narrow base of support (BOS);Wide (BOA);Head turns;Foam/compliant surface;Solid surface;5 reps;20 secs   Pt needing initial 1 UE support then intermittent-in corner       PT Education - 05/01/18 1305    Education Details  continue with corner balance exercises and add 1 UE assist on chair if needed.    Person(s) Educated  Patient;Spouse    Methods  Explanation;Demonstration    Comprehension  Verbalized understanding       PT Short Term Goals - 04/16/18 2140      PT SHORT TERM GOAL #1   Title  independent with initial HEP (Target for all STGs 05/16/2018)    Time  4    Period  Weeks    Status  New    Target Date  05/16/18      PT SHORT TERM GOAL #2   Title  Patient will ambulate with LRAD @ gait velocity of 2.62 ft/sec (indicative of velocity needed for safe community ambulation)    Baseline  04/16/18  2.16 ft/sec     Time  4    Period  Weeks    Status  New      PT SHORT TERM GOAL #3   Title  Pt will improve TUG score to <=17 sec demonstrating a lesser fall risk.     Baseline  12/16  TUG 20.88 sec    Time  4    Period  Weeks    Status  New        PT Long Term Goals - 04/24/18 1453      PT LONG TERM GOAL #1   Title  independent with updated HEP (Target all LTGs 06/15/2018)    Time  8    Period  Weeks    Status  New      PT LONG TERM GOAL #2   Title  Score on DHI will decr >=18 points    Baseline  04/24/18   72    Time  8    Period  Weeks    Status  On-going      PT LONG TERM GOAL #3  Title  Gait velocity will improve to >=2.8 ft/sec to demonstrate lesser fall risk.     Time  8    Period  Weeks    Status   New      PT LONG TERM GOAL #4   Title  Pt will perform TUG in <=15 sec    Time  8    Period  Weeks    Status  New            Plan - 05/01/18 1307    Clinical Impression Statement  Pt able to perform 5 minutes on Scifit today.  Attempted to take BP after Scifit but unacurrate reading on automatic wrist cuff.  Pt reports his BP is "all over the place."  Pt continues to report dizziness most of the time and some days better than others.  Continue PT per POC.    Rehab Potential  Good    Clinical Impairments Affecting Rehab Potential  dizziness present >2 yrs    PT Frequency  2x / week    PT Duration  8 weeks    PT Treatment/Interventions  ADLs/Self Care Home Management;Canalith Repostioning;DME Instruction;Gait training;Stair training;Functional mobility training;Therapeutic activities;Therapeutic exercise;Balance training;Patient/family education;Neuromuscular re-education;Manual techniques;Vestibular;Visual/perceptual remediation/compensation    PT Next Visit Plan  Increase time on Scifit to assess for ability to join [employer](pt wants to use recumbent bike at [facility]) and monitor vitals, ?add habituation activity to HEP; work on hip and step strategy; continue to challenge vestibular system    Consulted and Agree with Plan of Care  Patient;Family member/caregiver    Family Member Consulted  wife, [name]       Patient will benefit from skilled therapeutic intervention in order to improve the following deficits and impairments:  Abnormal gait, Cardiopulmonary status limiting activity, Decreased activity tolerance, Decreased balance, Decreased knowledge of use of DME, Decreased mobility, Decreased safety awareness, Dizziness, Impaired perceived functional ability, Impaired vision/preception, Impaired sensation, Postural dysfunction  Visit Diagnosis: Dizziness and giddiness  Other symptoms and signs involving the nervous system  Other abnormalities of gait and mobility  Muscle weakness  (generalized)     Problem List Patient Active Problem List   Diagnosis Date Noted  . History of lacunar cerebrovascular accident (CVA) 02/12/2018  . Dizziness 02/12/2018  . Myofascial pain 12/18/2017  . Spondylosis without myelopathy or radiculopathy, lumbar region 12/18/2017  . Lumbar radiculopathy, right 12/18/2017  . Cholelithiasis 05/17/2017  . Sinus Bradycardia 05/17/2017  . Thoracic radiculopathy 04/12/2017  . Primary osteoarthritis of right knee 12/14/2016  . DDD (degenerative disc disease), lumbar 10/10/2016  . Lumbar facet arthropathy 08/29/2016  . Idiopathic scoliosis 03/22/2016  . Diabetic neuropathy ([place]) 03/22/2016  . Dysautonomia orthostatic hypotension syndrome ([place]) 02/25/2016  . CKD (chronic kidney disease), stage III ([place]) 02/19/2016  . Coronary artery disease involving coronary bypass graft of native heart without angina pectoris   . Diabetes mellitus type 2, diet-controlled ([place])   . 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 PVD (peripheral vascular disease) ([place]) 01/01/2014  . Mixed hyperlipidemia 04/11/2013  . Supine hypertension   . Vitamin D deficiency   . Vitamin B 12 deficiency   . Coronary atherosclerosis- s/p PCI to LAD in 2009 and PCI to RCA in 2011 02/27/2009  . Abdominal aortic aneurysm ([place]) 02/27/2009  . GERD 02/27/2009  . History of IBS 02/27/2009    Narda Bonds, [place] 05/01/18 1:14 PM Phone: [phone] Fax: [facility] [address] [place], [place], [address] Phone: [phone]   Fax:  [phone]  Name: [name] MRN: [id number] Date of Birth: [birth year]

## 2018-05-01 NOTE — Telephone Encounter (Signed)
Per Dr Curt Bears, rhythm tracing appears to be possible A Flutter, but pt should not need to be anticoagulated at this time due to its short duration. Dr Caryl Comes to review upon his return to office.

## 2018-05-04 ENCOUNTER — Ambulatory Visit: Payer: Medicare Other | Attending: Adult Health | Admitting: Physical Therapy

## 2018-05-04 ENCOUNTER — Encounter: Payer: Self-pay | Admitting: Physical Therapy

## 2018-05-04 DIAGNOSIS — R42 Dizziness and giddiness: Secondary | ICD-10-CM

## 2018-05-04 DIAGNOSIS — R2689 Other abnormalities of gait and mobility: Secondary | ICD-10-CM

## 2018-05-04 DIAGNOSIS — R29818 Other symptoms and signs involving the nervous system: Secondary | ICD-10-CM | POA: Diagnosis not present

## 2018-05-04 DIAGNOSIS — M6281 Muscle weakness (generalized): Secondary | ICD-10-CM | POA: Diagnosis not present

## 2018-05-04 NOTE — Patient Instructions (Signed)
Ischemic Stroke  An ischemic stroke (cerebrovascular accident, or CVA) is the sudden death of brain tissue that occurs when an area of the brain does not get enough oxygen. It is a medical emergency that must be treated right away. An ischemic stroke can cause permanent loss of brain function. This can cause problems with how different parts of your body function. What are the causes? This condition is caused by a decrease of oxygen supply to an area of the brain, which may be the result of:  A small blood clot (embolus) or a buildup of plaque in the blood vessels (atherosclerosis) that blocks blood flow in the brain.  An abnormal heart rhythm (atrial fibrillation).  A blocked or damaged artery in the head or neck. Sometimes the cause of stroke is not known (cryptogenic). What increases the risk? Certain factors may make you more likely to develop this condition. Some of these factors are things that you can change, such as:  Obesity.  Smoking cigarettes.  Taking oral birth control, especially if you also use tobacco.  Physical inactivity.  Excessive alcohol use.  Use of illegal drugs, especially cocaine and methamphetamine. Other risk factors include:  High blood pressure (hypertension).  High cholesterol.  Diabetes mellitus.  Heart disease.  Being African American, Native American, Hispanic, or [place] Native.  Being over age 60.  Family history of stroke.  Previous history of blood clots, stroke, or transient ischemic attack (TIA).  Sickle cell disease.  Being a woman with a history of preeclampsia.  Migraine headache.  Sleep apnea.  Irregular heartbeats, such as atrial fibrillation.  Chronic inflammatory diseases, such as rheumatoid arthritis or lupus.  Blood clotting disorders (hypercoagulable state). What are the signs or symptoms? Symptoms of this condition usually develop suddenly, or you may notice them after waking up from sleep. Symptoms may include  sudden:  Weakness or numbness in your face, arm, or leg, especially on one side of your body.  Trouble walking or difficulty moving your arms or legs.  Loss of balance or coordination.  Confusion.  Slurred speech (dysarthria).  Trouble speaking, understanding speech, or both (aphasia).  Vision changes-such as double vision, blurred vision, or loss of vision-in one or both eyes.  Dizziness.  Nausea and vomiting.  Severe headache with no known cause. The headache is often described as the worst headache ever experienced. If possible, make note of the exact time that you last felt like your normal self and what time your symptoms started. Tell your health care provider. If symptoms come and go, this could be a sign of a warning stroke, or TIA. Get help right away, even if you feel better. How is this diagnosed? This condition may be diagnosed based on:  Your symptoms, your medical history, and a physical exam.  CT scan of the brain.  MRI.  CT angiogram. This test uses a computer to take X-rays of your arteries. A dye may be injected into your blood to show the inside of your blood vessels more clearly.  MRI angiogram. This is a type of MRI that is used to evaluate the blood vessels.  Cerebral angiogram. This test uses X-rays and a dye to show the blood vessels in the brain and neck. You may need to see a health care provider who specializes in stroke care. A stroke specialist can be seen in person or through communication using telephone or television technology (telemedicine). Other tests may also be done to find the cause of the stroke, such   as:  Electrocardiogram (ECG).  Continuous heart monitoring.  Echocardiogram.  Transesophageal echocardiogram (TEE).  Carotid ultrasound.  A scan of the brain circulation.  Blood tests.  Sleep study to check for sleep apnea. How is this treated? Treatment for this condition will depend on the duration, severity, and cause of  your symptoms and on the area of the brain affected. It is very important to get treatment at the first sign of stroke symptoms. Some treatments work better if they are done within 3-6 hours of the onset of stroke symptoms. These initial treatments may include:  Aspirin.  Medicines to control blood pressure.  Medicine given by injection to dissolve the blood clot (thrombolytic).  Treatments given directly to the affected artery to remove or dissolve the blood clot. Other treatment options may include:  Oxygen.  IV fluids.  Medicines to thin the blood (anticoagulants or antiplatelets).  Procedures to increase blood flow. Medicines and changes to your diet may be used to help treat and manage risk factors for stroke, such as diabetes, high cholesterol, and high blood pressure. After a stroke, you may work with physical, speech, mental health, or occupational therapists to help you recover. Follow these instructions at home: Medicines  Take over-the-counter and prescription medicines only as told by your health care provider.  If you were told to take a medicine to thin your blood, such as aspirin or an anticoagulant, take it exactly as told by your health care provider. ? Taking too much blood-thinning medicine can cause bleeding. ? If you do not take enough blood-thinning medicine, you will not have the protection that you need against another stroke and other problems.  Understand the side effects of taking anticoagulant medicine. When taking this type of medicine, make sure you: ? Hold pressure over any cuts for longer than usual. ? Tell your dentist and other health care providers that you are taking anticoagulants before you have any procedures that may cause bleeding. ? Avoid activities that may cause trauma or injury. Eating and drinking  Follow instructions from your health care provider about diet.  Eat healthy foods.  If your ability to swallow was affected by the  stroke, you may need to take steps to avoid choking, such as: ? Taking small bites when eating. ? Eating foods that are soft or pureed. Safety  Follow instructions from your health care team about physical activity.  Use a walker or cane as told by your health care provider.  Take steps to create a safe home environment in order to reduce the risk of falls. This may include: ? Having your home looked at by specialists. ? Installing grab bars in the bedroom and bathroom. ? Using safety equipment, such as raised toilets and a seat in the shower. General instructions  Do not use any tobacco products, such as cigarettes, chewing tobacco, and e-cigarettes. If you need help quitting, ask your health care provider.  Limit alcohol intake to no more than 1 drink a day for nonpregnant women and 2 drinks a day for men. One drink equals 12 oz of beer, 5 oz of wine, or 1 oz of hard liquor.  If you need help to stop using drugs or alcohol, ask your health care provider about a referral to a program or specialist.  Maintain an active and healthy lifestyle. Get regular exercise as told by your health care provider.  Keep all follow-up visits as told by your health care provider, including visits with all specialists on your   health care team. This is important. How is this prevented? Your risk of another stroke can be decreased by managing high blood pressure, high cholesterol, diabetes, heart disease, sleep apnea, and obesity. It can also be decreased by quitting smoking, limiting alcohol, and staying physically active. Your health care provider will continue to work with you on measures to prevent short-term and long-term complications of stroke. Get help right away if:   You have any symptoms of a stroke. "BE FAST" is an easy way to remember the main warning signs of a stroke: ? B - Balance. Signs are dizziness, sudden trouble walking, or loss of balance. ? E - Eyes. Signs are trouble seeing or a  sudden change in vision. ? F - Face. Signs are sudden weakness or numbness of the face, or the face or eyelid drooping on one side. ? A - Arms. Signs are weakness or numbness in an arm. This happens suddenly and usually on one side of the body. ? S - Speech. Signs are sudden trouble speaking, slurred speech, or trouble understanding what people say. ? T - Time. Time to call emergency services. Write down what time symptoms started.  You have other signs of a stroke, such as: ? A sudden, severe headache with no known cause. ? Nausea or vomiting. ? Seizure.  These symptoms may represent a serious problem that is an emergency. Do not wait to see if the symptoms will go away. Get medical help right away. Call your local emergency services (911 in the [place] Do not drive yourself to the hospital. Summary  An ischemic stroke (cerebrovascular accident, or CVA) is the sudden death of brain tissue that occurs when an area of the brain does not get enough oxygen.  Symptoms of this condition usually develop suddenly, or you may notice them after waking up from sleep.  It is very important to get treatment at the first sign of stroke symptoms. Stroke is a medical emergency that must be treated right away. This information is not intended to replace advice given to you by your health care provider. Make sure you discuss any questions you have with your health care provider. Document Released: 04/18/2005 Document Revised: 01/05/2018 Document Reviewed: 07/15/2015 Elsevier Interactive Patient Education  2019 [employer]vier Inc[employer]  

## 2018-05-04 NOTE — Therapy (Signed)
[facility] [address] [place], [place], [address] Phone: [phone]   Fax:  [phone]  Physical Therapy Treatment  Patient Details  Name: [name] MRN: [id number] Date of Birth: November 19, 1933 Referring Provider (PT): Dennis Poisson, NP   Encounter Date: 05/04/2018  PT End of Session - 05/04/18 1304    Visit Number  5    Number of Visits  17    Date for PT Re-Evaluation  05/16/18    Authorization Type  [employer] Medicare    Authorization Time Period  04/16/18 to 07/15/2018    PT Start Time  0935    PT Stop Time  1015    PT Time Calculation (min)  40 min    Equipment Utilized During Treatment  Gait belt    Activity Tolerance  Treatment limited secondary to medical complications (Comment)    Behavior During Therapy  [facility] for tasks assessed/performed       Past Medical History:  Diagnosis Date  . 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 Colon polyps   . Coronary atherosclerosis of unspecified type of vessel, native or graft   . Diabetes ([place])   . Difficult intubation   . Esophageal reflux   . High cholesterol   . Hypertension    pt denies, he says he has a h/o hypotension. If BP up he adjusts the [place]  . IBS (irritable bowel syndrome)   . Orthostatic hypotension    "BP has been dropping alot when I stand up for the last month or so" (02/17/2016)  . Vitamin B 12 deficiency   . Vitamin D deficiency     Past Surgical History:  Procedure Laterality Date  . BLADDER SURGERY  1969   traumatic pelvic fractures, urethral and bladder repair  . CARDIAC CATHETERIZATION N/A 07/01/2015   Procedure: Left Heart Cath and Coronary Angiography;  Surgeon: Dennis Hampshire, MD;  Location: [facility];  Service: Cardiovascular;  Laterality: N/A;  . COLON RESECTION N/A 05/17/2017   Procedure: DIAGNOSTIC LAPAROSCOPY,;  Surgeon: Dennis Ruff, MD;  Location: [facility];  Service: General;  Laterality: N/A;  . CORONARY  ANGIOPLASTY WITH STENT PLACEMENT    . CORONARY ARTERY BYPASS GRAFT N/A 07/06/2015   Procedure: CORONARY ARTERY BYPASS GRAFTING (CABG)x 4   utilizing the left internal mammary artery and endoscopically harvested bilateral  sapheneous vein.;  Surgeon: Dennis Poot, MD;  Location: [facility];  Service: Open Heart Surgery;  Laterality: N/A;  . KNEE SURGERY    . LOOP RECORDER INSERTION N/A 02/19/2018   Procedure: LOOP RECORDER INSERTION;  Surgeon: Dennis Sprang, MD;  Location: [facility];  Service: Cardiovascular;  Laterality: N/A;  . TEE WITHOUT CARDIOVERSION N/A 07/06/2015   Procedure: TRANSESOPHAGEAL ECHOCARDIOGRAM (TEE);  Surgeon: Dennis Poot, MD;  Location: [facility];  Service: Open Heart Surgery;  Laterality: N/A;    There were no vitals filed for this visit.  Subjective Assessment - 05/04/18 1300    Subjective  Pt and wife report his dizziness has been worse since Wednesday and has not gotten any better.  Also reports blurry vision and shaking at times.  Has not contacted MD but has an appointment with primary care doctor on Monday.  States "this is how it was last time when they said I had a stroke.  It took me a few days to get over it."    Patient is accompained by:  Family member   wife, [name]  Pertinent History  dysautonomia orthostatic hypotension, HTN, DDD, diabetic polyneuropathy, CKD stage 3; 01/24/18 acute right temporal CVA, Chronic bil basal ganglia and cerebellar infarcts, Advanced atherosclerosis involving the posterior circulation, chronic rt chest pain s/p CABG 2017    Diagnostic tests  MRI 02/23/18 acute left splenium infarct ; MRI 01/24/18 acute right temporal CVA    Patient Stated Goals  to not feel dizzy all the time; to be able to work on his cars    Currently in Pain?  No/denies    Pain Onset  More than a month ago         [facility] Adult PT Treatment/Exercise - 05/04/18 0001      Knee/Hip Exercises: Aerobic   Other Aerobic  Scifit level 2.0 all 4 extremities x 5  minutes.          Balance Exercises - 05/04/18 1626      Balance Exercises: Standing   Standing Eyes Opened  Narrow base of support (BOS);Wide (BOA);Head turns;Foam/compliant surface;Solid surface;5 reps;20 secs    Standing Eyes Closed  Narrow base of support (BOS);Wide (BOA);Head turns;Foam/compliant surface;Solid surface;5 reps;20 secs        PT Education - 05/04/18 1302    Education Details  CVA education handout (see pt instructions), need to call 911 if any other CVA warning signs/symptoms become present, recommendation to call MD and/or go to ED now.    Person(s) Educated  Patient;Spouse    Methods  Explanation;Handout    Comprehension  Verbalized understanding       PT Short Term Goals - 04/16/18 2140      PT SHORT TERM GOAL #1   Title  independent with initial HEP (Target for all STGs 05/16/2018)    Time  4    Period  Weeks    Status  New    Target Date  05/16/18      PT SHORT TERM GOAL #2   Title  Patient will ambulate with LRAD @ gait velocity of 2.62 ft/sec (indicative of velocity needed for safe community ambulation)    Baseline  04/16/18  2.16 ft/sec     Time  4    Period  Weeks    Status  New      PT SHORT TERM GOAL #3   Title  Pt will improve TUG score to <=17 sec demonstrating a lesser fall risk.     Baseline  12/16  TUG 20.88 sec    Time  4    Period  Weeks    Status  New        PT Long Term Goals - 04/24/18 1453      PT LONG TERM GOAL #1   Title  independent with updated HEP (Target all LTGs 06/15/2018)    Time  8    Period  Weeks    Status  New      PT LONG TERM GOAL #2   Title  Score on DHI will decr >=18 points    Baseline  04/24/18   72    Time  8    Period  Weeks    Status  On-going      PT LONG TERM GOAL #3   Title  Gait velocity will improve to >=2.8 ft/sec to demonstrate lesser fall risk.     Time  8    Period  Weeks    Status  New      PT LONG TERM GOAL #4   Title  Pt will perform  TUG in <=15 sec    Time  8    Period   Weeks    Status  New            Plan - 05/04/18 1305    Clinical Impression Statement  Pt reporting "not feeling right" on Wednesday and has not subsided since.  Reports blurred vision, dizziness, weak and dizzy.  Has still been doing his exercises at home.  Checked pt's vitals.  Pt has appointment on Monday wirh primary care MD.  Pt has not called MD about recent changes.  Asked if I could call primary MD and pt states "they aren't going to do anything for me except run a bunch of tests and not find anything."  Suggested pt go to Urgent Care or ED and pt declined.  Educated pt and wife on other signs/symptoms of CVA and instructed to call 911 if anything worsens.  Pt and wife agree to this.  Vitals checked during visit but pt has history on rapidly fluctuating BP readings.  Wife takes pts BP 2x/day and is aware of need to keep diastolic <326.  Discussed above with Barry Brunner, primary PT, who also discussed same with patient and wife.  Continue PT per POC as patient    Rehab Potential  Good    Clinical Impairments Affecting Rehab Potential  dizziness present >2 yrs    PT Frequency  2x / week    PT Duration  8 weeks    PT Treatment/Interventions  ADLs/Self Care Home Management;Canalith Repostioning;DME Instruction;Gait training;Stair training;Functional mobility training;Therapeutic activities;Therapeutic exercise;Balance training;Patient/family education;Neuromuscular re-education;Manual techniques;Vestibular;Visual/perceptual remediation/compensation    PT Next Visit Plan  Follow up on changes since last visit and primary MD appointment on 05/07/2018.  Increase time on Scifit to assess for ability to join [employer](pt wants to use recumbent bike at [facility]) and monitor vitals, ?add habituation activity to HEP; work on hip and step strategy; continue to challenge vestibular system    Consulted and Agree with Plan of Care  Patient;Family member/caregiver    Family Member Consulted  wife, [name]        Patient will benefit from skilled therapeutic intervention in order to improve the following deficits and impairments:  Abnormal gait, Cardiopulmonary status limiting activity, Decreased activity tolerance, Decreased balance, Decreased knowledge of use of DME, Decreased mobility, Decreased safety awareness, Dizziness, Impaired perceived functional ability, Impaired vision/preception, Impaired sensation, Postural dysfunction  Visit Diagnosis: Dizziness and giddiness  Other symptoms and signs involving the nervous system  Other abnormalities of gait and mobility  Muscle weakness (generalized)     Problem List Patient Active Problem List   Diagnosis Date Noted  . History of lacunar cerebrovascular accident (CVA) 02/12/2018  . Dizziness 02/12/2018  . Myofascial pain 12/18/2017  . Spondylosis without myelopathy or radiculopathy, lumbar region 12/18/2017  . Lumbar radiculopathy, right 12/18/2017  . Cholelithiasis 05/17/2017  . Sinus Bradycardia 05/17/2017  . Thoracic radiculopathy 04/12/2017  . Primary osteoarthritis of right knee 12/14/2016  . DDD (degenerative disc disease), lumbar 10/10/2016  . Lumbar facet arthropathy 08/29/2016  . Idiopathic scoliosis 03/22/2016  . Diabetic neuropathy ([place]) 03/22/2016  . Dysautonomia orthostatic hypotension syndrome ([place]) 02/25/2016  . CKD (chronic kidney disease), stage III ([place]) 02/19/2016  . Coronary artery disease involving coronary bypass graft of native heart without angina pectoris   . Diabetes mellitus type 2, diet-controlled ([place])   . 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 PVD (peripheral vascular disease) ([place]) 01/01/2014  .  Mixed hyperlipidemia 04/11/2013  . Supine hypertension   . Vitamin D deficiency   . Vitamin B 12 deficiency   . Coronary atherosclerosis- s/p PCI to LAD in 2009 and PCI to RCA in 2011 02/27/2009  . Abdominal aortic aneurysm ([place]) 02/27/2009  . GERD 02/27/2009  . History of IBS  02/27/2009    Narda Bonds, PTA [place] 05/04/18 4:28 PM Phone: [phone] Fax: [facility] [address] [place], [place], [address] Phone: [phone]   Fax:  [phone]  Name: [name] MRN: [id number] Date of Birth: 12-25-1933

## 2018-05-06 ENCOUNTER — Other Ambulatory Visit: Payer: Self-pay | Admitting: Physical Medicine & Rehabilitation

## 2018-05-06 ENCOUNTER — Encounter: Payer: Self-pay | Admitting: Internal Medicine

## 2018-05-06 NOTE — Patient Instructions (Signed)
Orthostatic Hypotension Blood pressure is a measurement of how strongly, or weakly, your blood is pressing against the walls of your arteries. Orthostatic hypotension is a sudden drop in blood pressure that happens when you quickly change positions, such as when you get up from sitting or lying down. Arteries are blood vessels that carry blood from your heart throughout your body. When blood pressure is too low, you may not get enough blood to your brain or to the rest of your organs. This can cause weakness, light-headedness, rapid heartbeat, and fainting. This can last for just a few seconds or for up to a few minutes. Orthostatic hypotension is usually not a serious problem. However, if it happens frequently or gets worse, it may be a sign of something more serious. What are the causes? This condition may be caused by:  Sudden changes in posture, such as standing up quickly after you have been sitting or lying down.  Blood loss.  Loss of body fluids (dehydration).  Heart problems.  Hormone (endocrine) problems.  Pregnancy.  Severe infection.  Lack of certain nutrients.  Severe allergic reactions (anaphylaxis).  Certain medicines, such as blood pressure medicine or medicines that make the body lose excess fluids (diuretics). Sometimes, this condition can be caused by not taking medicine as directed, such as taking too much of a certain medicine. What increases the risk? The following factors may make you more likely to develop this condition:  Age. Risk increases as you get older.  Conditions that affect the heart or the central nervous system.  Taking certain medicines, such as blood pressure medicine or diuretics.  Being pregnant. What are the signs or symptoms? Symptoms of this condition may include:  Weakness.  Light-headedness.  Dizziness.  Blurred vision.  Fatigue.  Rapid heartbeat.  Fainting, in severe cases. How is this diagnosed? This condition is  diagnosed based on:  Your medical history.  Your symptoms.  Your blood pressure measurement. Your health care provider will check your blood pressure when you are: ? Lying down. ? Sitting. ? Standing. A blood pressure reading is recorded as two numbers, such as "120 over 80" (or 120/80). The first ("top") number is called the systolic pressure. It is a measure of the pressure in your arteries as your heart beats. The second ("bottom") number is called the diastolic pressure. It is a measure of the pressure in your arteries when your heart relaxes between beats. Blood pressure is measured in a unit called mm Hg. Healthy blood pressure for most adults is 120/80. If your blood pressure is below 90/60, you may be diagnosed with hypotension. Other information or tests that may be used to diagnose orthostatic hypotension include:  Your other vital signs, such as your heart rate and temperature.  Blood tests.  Tilt table test. For this test, you will be safely secured to a table that moves you from a lying position to an upright position. Your heart rhythm and blood pressure will be monitored during the test. How is this treated? This condition may be treated by:  Changing your diet. This may involve eating more salt (sodium) or drinking more water.  Taking medicines to raise your blood pressure.  Changing the dosage of certain medicines you are taking that might be lowering your blood pressure.  Wearing compression stockings. These stockings help to prevent blood clots and reduce swelling in your legs. In some cases, you may need to go to the hospital for:  Fluid replacement. This means you will   receive fluids through an IV.  Blood replacement. This means you will receive donated blood through an IV (transfusion).  Treating an infection or heart problems, if this applies.  Monitoring. You may need to be monitored while medicines that you are taking wear off. Follow these instructions  at home: Eating and drinking   Drink enough fluid to keep your urine pale yellow.  Eat a healthy diet, and follow instructions from your health care provider about eating or drinking restrictions. A healthy diet includes: ? Fresh fruits and vegetables. ? Whole grains. ? Lean meats. ? Low-fat dairy products.  Eat extra salt only as directed. Do not add extra salt to your diet unless your health care provider told you to do that.  Eat frequent, small meals.  Avoid standing up suddenly after eating. Medicines  Take over-the-counter and prescription medicines only as told by your health care provider. ? Follow instructions from your health care provider about changing the dosage of your current medicines, if this applies. ? Do not stop or adjust any of your medicines on your own. General instructions   Wear compression stockings as told by your health care provider.  Get up slowly from lying down or sitting positions. This gives your blood pressure a chance to adjust.  Avoid hot showers and excessive heat as directed by your health care provider.  Return to your normal activities as told by your health care provider. Ask your health care provider what activities are safe for you.  Do not use any products that contain nicotine or tobacco, such as cigarettes, e-cigarettes, and chewing tobacco. If you need help quitting, ask your health care provider.  Keep all follow-up visits as told by your health care provider. This is important. Contact a health care provider if you:  Vomit.  Have diarrhea.  Have a fever for more than 2-3 days.  Feel more thirsty than usual.  Feel weak and tired. Get help right away if you:  Have chest pain.  Have a fast or irregular heartbeat.  Develop numbness in any part of your body.  Cannot move your arms or your legs.  Have trouble speaking.  Become sweaty or feel light-headed.  Faint.  Feel short of breath.  Have trouble staying  awake.  Feel confused. Summary  Orthostatic hypotension is a sudden drop in blood pressure that happens when you quickly change positions.  Orthostatic hypotension is usually not a serious problem.  It is diagnosed by having your blood pressure taken lying down, sitting, and then standing.  It may be treated by changing your diet or adjusting your medicines. ++++++++++++++++++++++++++++++++++   Understanding Your Risk for Falls Each year, millions of people suffer serious injuries from falls. It is important to understand your risk for falling. Talk with your health care provider about your risk and what you can do to lower it. There are actions you can take at home to lower your risk. If you do have a serious fall, it is important to tell your health care provider. Falling once raises your risk for falling again. How can falls affect me? Serious injuries from falls are common. These include:  Broken bones. Most hip fractures are caused by falls.  Traumatic brain injury (TBI). Falls are the most common cause of TBI. Fear of falling can also cause you to avoid activities and stay at home. This can make your muscles weaker and actually raise your risk for a fall. What can increase my risk? Serious injuries from  a fall most often happen to people older than age 77. Children and young adults ages 74-29 are also at higher risk. The more risk factors you have for falling, the higher your risk. Risk factors include:  Weakness in the lower body.  Lack (deficiency) of vitamin D.  Weak bones (osteoporosis).  Being generally weak or confused due to long-term (chronic) illness.  Dizziness or balance problems.  Poor vision.  Having depression.  Medicine that causes dizziness or drowsiness. These can include medicines for your blood pressure, heart, anxiety, insomnia, or edema, as well as pain medicines and muscle relaxants.  Drinking alcohol.  Foot pain or improper  footwear.  Working at a dangerous job.  Having had a fall in the past.  Tripping hazards at home, such as floor clutter or loose rugs, or poor lighting.  Having pets or clutter in your home. What actions can I take to lower my risk of falling?      Maintain physical fitness: ? Do strength and balance exercises. Consider taking a regular class to build strength and balance. Yoga and tai chi are good options. ? Have your eyes checked every year and your vision prescription updated as needed.  Remove all clutter from walkways and stairways, including extension cords.  Use a cordless phone.  Do not use throw rugs. Make sure all carpeting is taped or tacked down securely.  Use good lighting in all rooms. Keep a flashlight near your bed.  Make sure there is a clear path from your bed to the bathroom. Use night-lights.  Install grab bars for your tub, shower, and toilet. Use a bath mat in your tub or shower.  Attach secure railings on both sides of your stairs.  Repair uneven or broken steps.  Use a cane or walker as directed by your health care provider.  Wear nonskid shoes. Do not wear high heels. Do not walk around the house in socks or slippers.  Avoid walking on icy or slippery surfaces. Walk on the grass instead of on icy or slick sidewalks. Where you can, use ice melt to get rid of ice on walkways. Questions to ask your health care provider  Can you help me evaluate my risk for a fall?  Do any of my medicines make me more likely to fall?  Should I take a vitamin D supplement?  What exercises can I do to improve my strength and balance?  Should I make an appointment to have my vision checked?  Do I need a bone density test to check for osteoporosis?  Would it help to use a cane or a walker? Where to find more information  Centers for Disease Control and Prevention, STEADI: StoreMirror.com.cy  Community-Based Fall Prevention Programs: StoreMirror.com.cy  [employer] on  Aging: ToneConnect.com.ee Contact a health care provider if:  You fall at home.  You are afraid of falling at home.  You feel weak, drowsy, or dizzy at home. Summary  People 95 and older are at high risk for falling. However, older people are not the only ones injured in falls. Children and young adults have a higher-than-normal risk, too.  Talk with your health care provider about your risks for falling and how to lower those risks.  Taking certain precautions at home can lower your risk for falling.  If you fall, always tell your health care provider. +++++++++++++++++++++++++  Fall Prevention in the Home, Adult Falls can cause injuries and can affect people from all age groups. There are many  simple things that you can do to make your home safe and to help prevent falls. Ask for help when making these changes, if needed. What actions can I take to prevent falls? General instructions  Use good lighting in all rooms. Replace any light bulbs that burn out.  Turn on lights if it is dark. Use night-lights.  Place frequently used items in easy-to-reach places. Lower the shelves around your home if necessary.  Set up furniture so that there are clear paths around it. Avoid moving your furniture around.  Remove throw rugs and other tripping hazards from the floor.  Avoid walking on wet floors.  Fix any uneven floor surfaces.  Add color or contrast paint or tape to grab bars and handrails in your home. Place contrasting color strips on the first and last steps of stairways.  When you use a stepladder, make sure that it is completely opened and that the sides are firmly locked. Have someone hold the ladder while you are using it. Do not climb a closed stepladder.  Be aware of any and all pets. What can I do in the bathroom?      Keep the floor dry. Immediately clean up any water that spills onto the floor.  Remove soap buildup in the tub or shower on a regular  basis.  Use non-skid mats or decals on the floor of the tub or shower.  Attach bath mats securely with double-sided, non-slip rug tape.  If you need to sit down while you are in the shower, use a plastic, non-slip stool.  Install grab bars by the toilet and in the tub and shower. Do not use towel bars as grab bars. What can I do in the bedroom?  Make sure that a bedside light is easy to reach.  Do not use oversized bedding that drapes onto the floor.  Have a firm chair that has side arms to use for getting dressed. What can I do in the kitchen?  Clean up any spills right away.  If you need to reach for something above you, use a sturdy step stool that has a grab bar.  Keep electrical cables out of the way.  Do not use floor polish or wax that makes floors slippery. If you must use wax, make sure that it is non-skid floor wax. What can I do in the stairways?  Do not leave any items on the stairs.  Make sure that you have a light switch at the top of the stairs and the bottom of the stairs. Have them installed if you do not have them.  Make sure that there are handrails on both sides of the stairs. Fix handrails that are broken or loose. Make sure that handrails are as long as the stairways.  Install non-slip stair treads on all stairs in your home.  Avoid having throw rugs at the top or bottom of stairways, or secure the rugs with carpet tape to prevent them from moving.  Choose a carpet design that does not hide the edge of steps on the stairway.  Check any carpeting to make sure that it is firmly attached to the stairs. Fix any carpet that is loose or worn. What can I do on the outside of my home?  Use bright outdoor lighting.  Regularly repair the edges of walkways and driveways and fix any cracks.  Remove high doorway thresholds.  Trim any shrubbery on the main path into your home.  Regularly check that handrails are  securely fastened and in good repair. Both  sides of any steps should have handrails.  Install guardrails along the edges of any raised decks or porches.  Clear walkways of debris and clutter, including tools and rocks.  Have leaves, snow, and ice cleared regularly.  Use sand or salt on walkways during winter months.  In the garage, clean up any spills right away, including grease or oil spills. What other actions can I take?  Wear closed-toe shoes that fit well and support your feet. Wear shoes that have rubber soles or low heels.  Use mobility aids as needed, such as canes, walkers, scooters, and crutches.  Review your medicines with your health care provider. Some medicines can cause dizziness or changes in blood pressure, which increase your risk of falling. Talk with your health care provider about other ways that you can decrease your risk of falls. This may include working with a physical therapist or trainer to improve your strength, balance, and endurance. Where to find more information  Centers for Disease Control and Prevention, STEADI: WebmailGuide.co.za  [employer] on Aging: BrainJudge.co.uk Contact a health care provider if:  You are afraid of falling at home.  You feel weak, drowsy, or dizzy at home.  You fall at home. Summary  There are many simple things that you can do to make your home safe and to help prevent falls.  Ways to make your home safe include removing tripping hazards and installing grab bars in the bathroom.  Ask for help when making these changes in your home. This information is not intended to replace advice given to you by your health care provider. Make sure you discuss any questions you have with your health care provider. Document Released: 04/08/2002 Document Revised: 12/01/2016 Document Reviewed: 12/01/2016 Elsevier Interactive Patient Education  2019 [employer].

## 2018-05-06 NOTE — Progress Notes (Signed)
   Subjective:    Patient ID: [name], male    DOB: [birth year], 85 y.o.   MRN: [id number]  HPI   Patient returns for 2 week close f/u of Labile Hypertension and dysautonomia with postural hypotension. At last visit with elevate supine BP he was advised to restart his Olmesartan 40 x 1/2 tab and taper his Florinef 0.1 mg to 1 tablet daily. His CBC & BMET were OK. His wife is still monitoring postural BP's 2 x/day and is titrating his meds very appropriately.  Patient does report occasional postural lightheadedness. No syncope. Denies CP, palpitations or dyspnea.  Medication Sig  . acetaminophen  500 MG tablet Take 1,000 mg as needed for mild pain.  Marland Kitchen aspirin 81 MG tablet Take 1 tablet  daily.  Marland Kitchen FIORICET50-325-40 MG tablet Take 1 tablet every 4 hours ONLY if needed for severe Headache  . CALCIUM PO Take 1 tablet by mouth daily.  Marland Kitchen VIT D 5000 units  Take 5,000 Units by mouth daily.  . clopidogrel  75 MG tablet Take 1 tablet  daily  . diclofenac  1 % GEL APPLY 2 GRAMS TOPICALLY  4 TIMES DAILY  . REPATH Inject 1 Syringe into the skin every 14 (fourteen) days.  Marland Kitchen FLORINEF 0.1 MG tablet Take 0.1 mg by mouth 2 (two) times daily.  Marland Kitchen gabapentin 100 MG capsule TAKE 2 CAPSULE (200 MG)  2 TIMES A DAY.   . MultiVit w/Min  Take 1 tablet by mouth daily.  Marland Kitchen olmesartan  40 MG tablet Take 1/2 to 1 tablet daily for BP  . omeprazole20 MG capsule TAKE 2 CAPSULES EVERY DAY   . polyethylene glycol packet Take 17 g by mouth daily.  . diclofenac  1 % GEL Apply 2 g topically to affected area 4 times daily     Allergies  Allergen Reactions  . Cymbalta [Duloxetine Hcl]     Dizziness  . Keflex [Cephalexin] Other (See Comments)    Reaction: unknown   . Simvastatin Other (See Comments)    Reaction: unknown   . Sudafed [Pseudoephedrine]     Dizziness      10 point systems review negative except as above.     Objective:   Physical Exam  BP (!) 168/90 Comment: 168/90-sit/134/88-stand  Pulse 76  Comment: 76-sit and 84 standing  Temp (!) 97.3 F (36.3 C)   Resp 18   Ht 6' (1.829 m)   Wt 198 lb 6.4 oz (90 kg)   BMI 26.91 kg/m   HEENT - WNL. Neck - supple.  Chest - Clear equal BS. Cor - Nl HS. RRR w/o sig MGR. PP 1(+). No edema. MS- FROM w/o deformities.  Gait Nl. Neuro -  Nl w/o focal abnormalities.    Assessment & Plan:   1. Labile hypertension  2. Dysautonomia orthostatic hypotension syndrome (HCC)  3. At high risk for falls  - discussed continue freq BP monitoring & call if any questions.

## 2018-05-07 ENCOUNTER — Ambulatory Visit: Payer: Medicare Other | Admitting: Internal Medicine

## 2018-05-07 VITALS — BP 168/90 | HR 76 | Temp 97.3°F | Resp 18 | Ht 72.0 in | Wt 198.4 lb

## 2018-05-07 DIAGNOSIS — R0989 Other specified symptoms and signs involving the circulatory and respiratory systems: Secondary | ICD-10-CM | POA: Diagnosis not present

## 2018-05-07 DIAGNOSIS — G903 Multi-system degeneration of the autonomic nervous system: Secondary | ICD-10-CM

## 2018-05-07 DIAGNOSIS — I951 Orthostatic hypotension: Secondary | ICD-10-CM

## 2018-05-07 DIAGNOSIS — Z9181 History of falling: Secondary | ICD-10-CM | POA: Diagnosis not present

## 2018-05-08 ENCOUNTER — Encounter: Payer: Self-pay | Admitting: Physical Therapy

## 2018-05-08 ENCOUNTER — Ambulatory Visit: Payer: Medicare Other | Admitting: Physical Therapy

## 2018-05-08 VITALS — BP 154/86

## 2018-05-08 DIAGNOSIS — R29818 Other symptoms and signs involving the nervous system: Secondary | ICD-10-CM

## 2018-05-08 DIAGNOSIS — R2689 Other abnormalities of gait and mobility: Secondary | ICD-10-CM | POA: Diagnosis not present

## 2018-05-08 DIAGNOSIS — R42 Dizziness and giddiness: Secondary | ICD-10-CM

## 2018-05-08 DIAGNOSIS — M6281 Muscle weakness (generalized): Secondary | ICD-10-CM | POA: Diagnosis not present

## 2018-05-08 NOTE — Therapy (Signed)
[facility] [address] [place], [place], [address] Phone: [phone]   Fax:  [phone]  Physical Therapy Treatment  Patient Details  Name: [name] MRN: [id number] Date of Birth: 06-14-1933 Referring Provider (PT): Venancio Poisson, NP   Encounter Date: 05/08/2018  PT End of Session - 05/08/18 0853    Visit Number  6    Number of Visits  17    Date for PT Re-Evaluation  05/16/18    Authorization Type  UHC Medicare    Authorization Time Period  04/16/18 to 07/15/2018    PT Start Time  0847    PT Stop Time  0934    PT Time Calculation (min)  47 min    Equipment Utilized During Treatment  Gait belt    Activity Tolerance  Patient tolerated treatment well    Behavior During Therapy  [facility] for tasks assessed/performed       Past Medical History:  Diagnosis Date  . 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 Colon polyps   . Coronary atherosclerosis of unspecified type of vessel, native or graft   . Diabetes ([place])   . Difficult intubation   . Esophageal reflux   . High cholesterol   . Hypertension    pt denies, he says he has a h/o hypotension. If BP up he adjusts the Florinef  . IBS (irritable bowel syndrome)   . Orthostatic hypotension    "BP has been dropping alot when I stand up for the last month or so" (02/17/2016)  . Vitamin B 12 deficiency   . Vitamin D deficiency     Past Surgical History:  Procedure Laterality Date  . BLADDER SURGERY  1969   traumatic pelvic fractures, urethral and bladder repair  . CARDIAC CATHETERIZATION N/A 07/01/2015   Procedure: Left Heart Cath and Coronary Angiography;  Surgeon: Wellington Hampshire, MD;  Location: [facility];  Service: Cardiovascular;  Laterality: N/A;  . COLON RESECTION N/A 05/17/2017   Procedure: DIAGNOSTIC LAPAROSCOPY,;  Surgeon: Leighton Ruff, MD;  [facility];  Service: General;  Laterality: N/A;  . CORONARY ANGIOPLASTY WITH STENT PLACEMENT     . CORONARY ARTERY BYPASS GRAFT N/A 07/06/2015   Procedure: CORONARY ARTERY BYPASS GRAFTING (CABG)x 4   utilizing the left internal mammary artery and endoscopically harvested bilateral  sapheneous vein.;  Surgeon: Ivin Poot, MD;  Location: [facility];  Service: Open Heart Surgery;  Laterality: N/A;  . KNEE SURGERY    . LOOP RECORDER INSERTION N/A 02/19/2018   Procedure: LOOP RECORDER INSERTION;  Surgeon: Deboraha Sprang, MD;  Location: [place] [facility];  Service: Cardiovascular;  Laterality: N/A;  . TEE WITHOUT CARDIOVERSION N/A 07/06/2015   Procedure: TRANSESOPHAGEAL ECHOCARDIOGRAM (TEE);  Surgeon: Ivin Poot, MD;  Location: [place];  Service: Open Heart Surgery;  Laterality: N/A;    Vitals:   05/08/18 0932  BP: (!) 154/86    Subjective Assessment - 05/08/18 0854    Subjective  Today is a rough morning. Vision is no different than it's been, it's been off since September and has improved slightly since then. (Had a hole in the midline where he couldn't see and now he can).     Patient is accompained by:  Family member   wife, [name]   Pertinent History  dysautonomia orthostatic hypotension, HTN, DDD, diabetic polyneuropathy, CKD stage 3; 01/24/18 acute right temporal CVA, Chronic bil basal ganglia and cerebellar infarcts, Advanced atherosclerosis involving the  posterior circulation, chronic rt chest pain s/p CABG 2017    Diagnostic tests  MRI 02/23/18 acute left splenium infarct ; MRI 01/24/18 acute right temporal CVA    Patient Stated Goals  to not feel dizzy all the time; to be able to work on his cars    Currently in Pain?  Yes    Pain Score  6     Pain Location  Back    Pain Orientation  Left    Pain Descriptors / Indicators  Aching    Pain Type  Chronic pain    Pain Onset  More than a month ago    Pain Frequency  Intermittent                       [facility] Adult PT Treatment/Exercise - 05/08/18 1951      Transfers   Sit to Stand  4: Min guard    Stand to [employer]  4:  Min guard      Ambulation/Gait   Ambulation/Gait Assistance  4: Min guard    Ambulation/Gait Assistance Details  off-balance despite cane; close guard for Development worker, community (Feet)  --   throughout gym   Assistive device  Straight cane    Gait Pattern  Step-through pattern;Decreased arm swing - left;Decreased trunk rotation;Trunk flexed;Narrow base of support;Wide base of support;Poor foot clearance - left;Poor foot clearance - right          Balance Exercises - 05/08/18 0908      Balance Exercises: Standing   Standing Eyes Opened  Wide (BOA);Head turns;Solid surface    Standing Eyes Closed  Wide (BOA);Solid surface;20 secs    Gait with Head Turns  Forward   with cane; scanning for colored items   Partial Tandem Stance  Eyes open   wide stance, partial tandem; head still       PT Education - 05/08/18 1952    Education Details  recommend not walking in house in "sock feet" for risk of slip/fall and risk of stepping on something, injuring foot and due to numbness not realizing he's injured his foot; recommended pt try walking in his "dress shoes" that have a thinner more firm sole as his "cushey" sneakers make it more difficult for him to balance--if able to walk/balance better in "work shoes" then can look to buy sneakers/casual shoes with thinner soles; results of BP readings after stepper and resisted leg press were favorable--would like to see one more visit with BP under control with activity and then may [facility]    Person(s) Educated  Patient    Methods  Explanation    Comprehension  Verbalized understanding       PT Short Term Goals - 04/16/18 2140      PT SHORT TERM GOAL #1   Title  independent with initial HEP (Target for all STGs 05/16/2018)    Time  4    Period  Weeks    Status  New    Target Date  05/16/18      PT SHORT TERM GOAL #2   Title  Patient will ambulate with LRAD @ gait velocity of 2.62 ft/sec  (indicative of velocity needed for safe community ambulation)    Baseline  04/16/18  2.16 ft/sec     Time  4    Period  Weeks    Status  New      PT SHORT  TERM GOAL #3   Title  Pt will improve TUG score to <=17 sec demonstrating a lesser fall risk.     Baseline  12/16  TUG 20.88 sec    Time  4    Period  Weeks    Status  New        PT Long Term Goals - 04/24/18 1453      PT LONG TERM GOAL #1   Title  independent with updated HEP (Target all LTGs 06/15/2018)    Time  8    Period  Weeks    Status  New      PT LONG TERM GOAL #2   Title  Score on DHI will decr >=18 points    Baseline  04/24/18   72    Time  8    Period  Weeks    Status  On-going      PT LONG TERM GOAL #3   Title  Gait velocity will improve to >=2.8 ft/sec to demonstrate lesser fall risk.     Time  8    Period  Weeks    Status  New      PT LONG TERM GOAL #4   Title  Pt will perform TUG in <=15 sec    Time  8    Period  Weeks    Status  New            Plan - 05/08/18 1958    Clinical Impression Statement  Reports no further change in his symptoms from last visit. Skilled session included education re: BP response with exercise and monitoring BP throughout session. Balance/vestibular training with incorporating VOrx1 exercise, head turns, standing with narrow BOS. Progressed to seated LE strength and endurance activities when his legs became fatigued with "shaking" after standing activities. Conitnue PT with focus on BP tolerance [facility]     Rehab Potential  Good    Clinical Impairments Affecting Rehab Potential  dizziness present >2 yrs    PT Frequency  2x / week    PT Duration  8 weeks    PT Treatment/Interventions  ADLs/Self Care Home Management;Canalith Repostioning;DME Instruction;Gait training;Stair training;Functional mobility training;Therapeutic activities;Therapeutic exercise;Balance training;Patient/family education;Neuromuscular re-education;Manual  techniques;Vestibular;Visual/perceptual remediation/compensation    PT Next Visit Plan  Monitor BP pre- and post-exercise; Increase time on Scifit to assess for ability to join [employer](pt wants to use recumbent bike at [facility]) and monitor vitals, ?add habituation activity to HEP; work on hip and step strategy; continue to challenge vestibular system    Consulted and Agree with Plan of Care  Patient;Family member/caregiver    Family Member Consulted  wife, [name]       Patient will benefit from skilled therapeutic intervention in order to improve the following deficits and impairments:  Abnormal gait, Cardiopulmonary status limiting activity, Decreased activity tolerance, Decreased balance, Decreased knowledge of use of DME, Decreased mobility, Decreased safety awareness, Dizziness, Impaired perceived functional ability, Impaired vision/preception, Impaired sensation, Postural dysfunction  Visit Diagnosis: Dizziness and giddiness  Other symptoms and signs involving the nervous system  Other abnormalities of gait and mobility     Problem List Patient Active Problem List   Diagnosis Date Noted  . History of lacunar cerebrovascular accident (CVA) 02/12/2018  . Dizziness 02/12/2018  . Myofascial pain 12/18/2017  . Spondylosis without myelopathy or radiculopathy, lumbar region 12/18/2017  . Lumbar radiculopathy, right 12/18/2017  . Cholelithiasis 05/17/2017  . Sinus Bradycardia 05/17/2017  . Thoracic radiculopathy 04/12/2017  .  Primary osteoarthritis of right knee 12/14/2016  . DDD (degenerative disc disease), lumbar 10/10/2016  . Lumbar facet arthropathy 08/29/2016  . Idiopathic scoliosis 03/22/2016  . Diabetic neuropathy ([place]) 03/22/2016  . Dysautonomia orthostatic hypotension syndrome ([place]) 02/25/2016  . CKD (chronic kidney disease), stage III ([place]) 02/19/2016  . Coronary artery disease involving coronary bypass graft of native heart without angina pectoris   . Diabetes mellitus type 2,  diet-controlled ([place])   . 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 PVD (peripheral vascular disease) ([place]) 01/01/2014  . Mixed hyperlipidemia 04/11/2013  . Supine hypertension   . Vitamin D deficiency   . Vitamin B 12 deficiency   . Coronary atherosclerosis- s/p PCI to LAD in 2009 and PCI to RCA in 2011 02/27/2009  . Abdominal aortic aneurysm ([place]) 02/27/2009  . GERD 02/27/2009  . History of IBS 02/27/2009    Rexanne Mano, PT 05/08/2018, 8:05 PM  [facility] [address] [place], [place], [address] Phone: [phone]   Fax:  [phone]  Name: OLANREWAJU OSBORN MRN: [id number] Date of Birth: 1933-08-05

## 2018-05-09 ENCOUNTER — Ambulatory Visit: Payer: Self-pay | Admitting: Adult Health

## 2018-05-09 NOTE — Telephone Encounter (Signed)
Dr. Caryl Comes reviewed tachy episode #3 from 04/23/18, advised pattern most consistent w/ ST and PVCs. Remaining ECGs placed in Dr. Janalyn Harder folder for review.

## 2018-05-10 ENCOUNTER — Ambulatory Visit: Payer: Medicare Other | Admitting: Physical Therapy

## 2018-05-10 ENCOUNTER — Encounter: Payer: Self-pay | Admitting: Physical Therapy

## 2018-05-10 VITALS — BP 164/84 | HR 96

## 2018-05-10 DIAGNOSIS — R29818 Other symptoms and signs involving the nervous system: Secondary | ICD-10-CM | POA: Diagnosis not present

## 2018-05-10 DIAGNOSIS — M6281 Muscle weakness (generalized): Secondary | ICD-10-CM | POA: Diagnosis not present

## 2018-05-10 DIAGNOSIS — R42 Dizziness and giddiness: Secondary | ICD-10-CM | POA: Diagnosis not present

## 2018-05-10 DIAGNOSIS — R2689 Other abnormalities of gait and mobility: Secondary | ICD-10-CM

## 2018-05-10 NOTE — Therapy (Signed)
[facility] [address] [place], [place], [address] Phone: [phone]   Fax:  [phone]  Physical Therapy Treatment  Patient Details  Name: [name] MRN: [id number] Date of Birth: August 04, 1933 Referring Provider (PT): Venancio Poisson, NP   Encounter Date: 05/10/2018  PT End of Session - 05/10/18 0829    Visit Number  7    Number of Visits  17    Date for PT Re-Evaluation  05/16/18    Authorization Type  UHC Medicare    Authorization Time Period  04/16/18 to 07/15/2018    PT Start Time  0830    PT Stop Time  0914    PT Time Calculation (min)  44 min    Equipment Utilized During Treatment  Gait belt    Activity Tolerance  Patient tolerated treatment well    Behavior During Therapy  [facility] for tasks assessed/performed       Past Medical History:  Diagnosis Date  . 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 Colon polyps   . Coronary atherosclerosis of unspecified type of vessel, native or graft   . Diabetes ([place])   . Difficult intubation   . Esophageal reflux   . High cholesterol   . Hypertension    pt denies, he says he has a h/o hypotension. If BP up he adjusts the Florinef  . IBS (irritable bowel syndrome)   . Orthostatic hypotension    "BP has been dropping alot when I stand up for the last month or so" (02/17/2016)  . Vitamin B 12 deficiency   . Vitamin D deficiency     Past Surgical History:  Procedure Laterality Date  . BLADDER SURGERY  1969   traumatic pelvic fractures, urethral and bladder repair  . CARDIAC CATHETERIZATION N/A 07/01/2015   Procedure: Left Heart Cath and Coronary Angiography;  Surgeon: Wellington Hampshire, MD;  Location: [facility];  Service: Cardiovascular;  Laterality: N/A;  . COLON RESECTION N/A 05/17/2017   Procedure: DIAGNOSTIC LAPAROSCOPY,;  Surgeon: Leighton Ruff, MD;  [facility];  Service: General;  Laterality: N/A;  . CORONARY ANGIOPLASTY WITH STENT PLACEMENT     . CORONARY ARTERY BYPASS GRAFT N/A 07/06/2015   Procedure: CORONARY ARTERY BYPASS GRAFTING (CABG)x 4   utilizing the left internal mammary artery and endoscopically harvested bilateral  sapheneous vein.;  Surgeon: Ivin Poot, MD;  Location: [facility];  Service: Open Heart Surgery;  Laterality: N/A;  . KNEE SURGERY    . LOOP RECORDER INSERTION N/A 02/19/2018   Procedure: LOOP RECORDER INSERTION;  Surgeon: Deboraha Sprang, MD;  Location: [place] [facility];  Service: Cardiovascular;  Laterality: N/A;  . TEE WITHOUT CARDIOVERSION N/A 07/06/2015   Procedure: TRANSESOPHAGEAL ECHOCARDIOGRAM (TEE);  Surgeon: Ivin Poot, MD;  Location: [facility];  Service: Open Heart Surgery;  Laterality: N/A;    Vitals:   05/10/18 0840 05/10/18 0856 05/10/18 0857 05/10/18 0906  BP: 122/84 (!) 174/94 (!) 184/104 (!) 164/84  Pulse: 80 94 96     Subjective Assessment - 05/10/18 0829    Subjective  Found a pair of flat (less cushy) sneakers in his closet and put them on this morning. Thinks they may make a difference. Had a near fall the other night going to the bathroom. He did not have any device with him and has now pulled out his walker to take with him at night. Has trouble reading sometimes things look double    Patient  is accompained by:  Family member   wife, [name]   Pertinent History  dysautonomia orthostatic hypotension, HTN, DDD, diabetic polyneuropathy, CKD stage 3; 01/24/18 acute right temporal CVA, Chronic bil basal ganglia and cerebellar infarcts, Advanced atherosclerosis involving the posterior circulation, chronic rt chest pain s/p CABG 2017    Diagnostic tests  MRI 02/23/18 acute left splenium infarct ; MRI 01/24/18 acute right temporal CVA    Patient Stated Goals  to not feel dizzy all the time; to be able to work on his cars    Currently in Pain?  Yes    Pain Score  2     Pain Location  Back    Pain Orientation  Left    Pain Descriptors / Indicators  Cramping;Aching    Pain Type  Chronic pain     Pain Radiating Towards  to chest    Pain Onset  More than a month ago                       [facility] Adult PT Treatment/Exercise - 05/10/18 0918      Exercises   Exercises  Other Exercises    Other Exercises   Pt inquiring about stretches he was given by Dr Naaman Plummer for his back. Reports his rt knee hurts after doing these. Modified his single knee to chest with pt holding behind his knee instead of his anterior knee; also recommended stick with [facility] only (was also doing [place])      Knee/Hip Exercises: Aerobic   Stepper  Sci-Fit all 4 extremities x 6 min total (checking BP every 3 minutes--exercise stopped with BP 184/104)             PT Education - 05/10/18 0922    Education Details  discussed how much BP changes with exercise--today perhaps more because he used UEs on stepper and previously only did LEs; ? return to cardiac rehab maintenance program for supervised exercise sessions (cost is prohibitive for pt); patient/wife report his BP has been so variable for years (has dysautonomia orthostatic hypotension) and his wife has been trained by his MDs to monitor his BP several times per day and adjust his BP meds as indicated by results; they [facility] and [facility] with wife using their BP cuff to closely monitor pt. Agree this is not ideal and is reasonable as I want him to exercise.     Person(s) Educated  Patient;Spouse    Methods  Explanation    Comprehension  Verbalized understanding       PT Short Term Goals - 04/16/18 2140      PT SHORT TERM GOAL #1   Title  independent with initial HEP (Target for all STGs 05/16/2018)    Time  4    Period  Weeks    Status  New    Target Date  05/16/18      PT SHORT TERM GOAL #2   Title  Patient will ambulate with LRAD @ gait velocity of 2.62 ft/sec (indicative of velocity needed for safe community ambulation)    Baseline  04/16/18  2.16 ft/sec     Time  4    Period  Weeks    Status   New      PT SHORT TERM GOAL #3   Title  Pt will improve TUG score to <=17 sec demonstrating a lesser fall risk.     Baseline  12/16  TUG  20.88 sec    Time  4    Period  Weeks    Status  New        PT Long Term Goals - 04/24/18 1453      PT LONG TERM GOAL #1   Title  independent with updated HEP (Target all LTGs 06/15/2018)    Time  8    Period  Weeks    Status  New      PT LONG TERM GOAL #2   Title  Score on DHI will decr >=18 points    Baseline  04/24/18   72    Time  8    Period  Weeks    Status  On-going      PT LONG TERM GOAL #3   Title  Gait velocity will improve to >=2.8 ft/sec to demonstrate lesser fall risk.     Time  8    Period  Weeks    Status  New      PT LONG TERM GOAL #4   Title  Pt will perform TUG in <=15 sec    Time  8    Period  Weeks    Status  New            Plan - 05/10/18 9833    Clinical Impression Statement  Session focused on establishing if pt is safe [facility]. See note for details re: elevated BP after 6 minutes using all 4 extremities. See education for discussion re: safe community exercise. Also assessed his back radiating to chest pain that pt can reproduce with palpation and did not change with exercise. REcommendations for HEP discussed. Pateint continues to do his HEP faithfully and can continue to benefit from PT.     Rehab Potential  Good    Clinical Impairments Affecting Rehab Potential  dizziness present >2 yrs    PT Frequency  2x / week    PT Duration  8 weeks    PT Treatment/Interventions  ADLs/Self Care Home Management;Canalith Repostioning;DME Instruction;Gait training;Stair training;Functional mobility training;Therapeutic activities;Therapeutic exercise;Balance training;Patient/family education;Neuromuscular re-education;Manual techniques;Vestibular;Visual/perceptual remediation/compensation    PT Next Visit Plan  Monitor BP pre- and post-exercise; ?add habituation  activity to HEP; work on hip and step strategy; continue to challenge vestibular system; do Sci-Fit with LEs only;  Increase time on Scifit to assess for ability to join [employer] (pt wants to use recumbent bike at [facility])    [employer] and Agree with Plan of Care  Patient;Family member/caregiver    Family Member Consulted  wife, [name]       Patient will benefit from skilled therapeutic intervention in order to improve the following deficits and impairments:  Abnormal gait, Cardiopulmonary status limiting activity, Decreased activity tolerance, Decreased balance, Decreased knowledge of use of DME, Decreased mobility, Decreased safety awareness, Dizziness, Impaired perceived functional ability, Impaired vision/preception, Impaired sensation, Postural dysfunction  Visit Diagnosis: Dizziness and giddiness  Other symptoms and signs involving the nervous system  Other abnormalities of gait and mobility     Problem List Patient Active Problem List   Diagnosis Date Noted  . History of lacunar cerebrovascular accident (CVA) 02/12/2018  . Dizziness 02/12/2018  . Myofascial pain 12/18/2017  . Spondylosis without myelopathy or radiculopathy, lumbar region 12/18/2017  . Lumbar radiculopathy, right 12/18/2017  . Cholelithiasis 05/17/2017  . Sinus Bradycardia 05/17/2017  . Thoracic radiculopathy 04/12/2017  . Primary osteoarthritis of right knee 12/14/2016  . DDD (degenerative disc disease), lumbar  10/10/2016  . Lumbar facet arthropathy 08/29/2016  . Idiopathic scoliosis 03/22/2016  . Diabetic neuropathy ([place]) 03/22/2016  . Dysautonomia orthostatic hypotension syndrome ([place]) 02/25/2016  . CKD (chronic kidney disease), stage III ([place]) 02/19/2016  . Coronary artery disease involving coronary bypass graft of native heart without angina pectoris   . Diabetes mellitus type 2, diet-controlled ([place])   . 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 PVD (peripheral vascular disease) ([place])  01/01/2014  . Mixed hyperlipidemia 04/11/2013  . Supine hypertension   . Vitamin D deficiency   . Vitamin B 12 deficiency   . Coronary atherosclerosis- s/p PCI to LAD in 2009 and PCI to RCA in 2011 02/27/2009  . Abdominal aortic aneurysm ([place]) 02/27/2009  . GERD 02/27/2009  . History of IBS 02/27/2009    Jeanie Cooks Reiana Poteet. PT 05/10/2018, 9:31 AM  [facility] [address] [place], [place], [address] Phone: [phone]   Fax:  [phone]  Name: [name] MRN: [id number] Date of Birth: 1933-07-18

## 2018-05-11 ENCOUNTER — Other Ambulatory Visit: Payer: Self-pay | Admitting: Adult Health

## 2018-05-11 NOTE — Telephone Encounter (Signed)
Per Dr Olin Pia interpretation, recent linq alert shows Sinus tachycardia with PVC's. No changes at this time.

## 2018-05-13 LAB — CUP PACEART REMOTE DEVICE CHECK
Date Time Interrogation Session: 20191123113552
Implantable Pulse Generator Implant Date: 20191021

## 2018-05-15 ENCOUNTER — Encounter: Payer: Medicare Other | Admitting: Physical Medicine & Rehabilitation

## 2018-05-15 ENCOUNTER — Other Ambulatory Visit: Payer: Self-pay | Admitting: Internal Medicine

## 2018-05-15 DIAGNOSIS — M5134 Other intervertebral disc degeneration, thoracic region: Secondary | ICD-10-CM | POA: Diagnosis not present

## 2018-05-15 DIAGNOSIS — M9902 Segmental and somatic dysfunction of thoracic region: Secondary | ICD-10-CM | POA: Diagnosis not present

## 2018-05-16 ENCOUNTER — Encounter: Payer: Self-pay | Admitting: Physical Therapy

## 2018-05-16 ENCOUNTER — Ambulatory Visit: Payer: Medicare Other | Admitting: Physical Therapy

## 2018-05-16 VITALS — BP 178/92

## 2018-05-16 DIAGNOSIS — M6281 Muscle weakness (generalized): Secondary | ICD-10-CM | POA: Diagnosis not present

## 2018-05-16 DIAGNOSIS — R42 Dizziness and giddiness: Secondary | ICD-10-CM

## 2018-05-16 DIAGNOSIS — R29818 Other symptoms and signs involving the nervous system: Secondary | ICD-10-CM | POA: Diagnosis not present

## 2018-05-16 DIAGNOSIS — R2689 Other abnormalities of gait and mobility: Secondary | ICD-10-CM

## 2018-05-16 NOTE — Therapy (Signed)
[facility] [address] [place], [place], [address] Phone: [phone]   Fax:  [phone]  Physical Therapy Treatment  Patient Details  Name: [name] MRN: [id number] Date of Birth: November 25, 1933 Referring Provider (PT): Venancio Poisson, NP   Encounter Date: 05/16/2018  PT End of Session - 05/16/18 0938    Visit Number  8    Number of Visits  17    Date for PT Re-Evaluation  05/16/18    Authorization Type  [employer]HC Medicare    Authorization Time Period  04/16/18 to 07/15/2018    PT Start Time  0935    PT Stop Time  1015    PT Time Calculation (min)  40 min    Equipment Utilized During Treatment  Gait belt    Activity Tolerance  Patient tolerated treatment well    Behavior During Therapy  [facility] for tasks assessed/performed       Past Medical History:  Diagnosis Date  . 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 Colon polyps   . Coronary atherosclerosis of unspecified type of vessel, native or graft   . Diabetes ([place])   . Difficult intubation   . Esophageal reflux   . High cholesterol   . Hypertension    pt denies, he says he has a h/o hypotension. If BP up he adjusts the Florinef  . IBS (irritable bowel syndrome)   . Orthostatic hypotension    "BP has been dropping alot when I stand up for the last month or so" (02/17/2016)  . Vitamin B 12 deficiency   . Vitamin D deficiency     Past Surgical History:  Procedure Laterality Date  . BLADDER SURGERY  1969   traumatic pelvic fractures, urethral and bladder repair  . CARDIAC CATHETERIZATION N/A 07/01/2015   Procedure: Left Heart Cath and Coronary Angiography;  Surgeon: Wellington Hampshire, MD;  Location: [facility];  Service: Cardiovascular;  Laterality: N/A;  . COLON RESECTION N/A 05/17/2017   Procedure: DIAGNOSTIC LAPAROSCOPY,;  Surgeon: Leighton Ruff, MD;  [facility];  Service: General;  Laterality: N/A;  . CORONARY ANGIOPLASTY WITH STENT PLACEMENT     . CORONARY ARTERY BYPASS GRAFT N/A 07/06/2015   Procedure: CORONARY ARTERY BYPASS GRAFTING (CABG)x 4   utilizing the left internal mammary artery and endoscopically harvested bilateral  sapheneous vein.;  Surgeon: Ivin Poot, MD;  Location: [facility];  Service: Open Heart Surgery;  Laterality: N/A;  . KNEE SURGERY    . LOOP RECORDER INSERTION N/A 02/19/2018   Procedure: LOOP RECORDER INSERTION;  Surgeon: Deboraha Sprang, MD;  Location: [facility];  Service: Cardiovascular;  Laterality: N/A;  . TEE WITHOUT CARDIOVERSION N/A 07/06/2015   Procedure: TRANSESOPHAGEAL ECHOCARDIOGRAM (TEE);  Surgeon: Ivin Poot, MD;  Location: [facility];  Service: Open Heart Surgery;  Laterality: N/A;    Vitals: while using seated Sci-Fit stepper with legs only   05/16/18 1002 05/16/18 1005 05/16/18 1010 05/16/18 1014  BP: (!) 162/92 (!) 142/90 (!) 162/92 (!) 178/92    Subjective Assessment - 05/16/18 0937    Subjective  His back is really bothering him (below left shoulder blade, and rt low back). Saw chiropractor and it really didn't help (has seen him for years). Eyesight is not any better.     Patient is accompained by:  Family member   wife, [name]   Pertinent History  dysautonomia orthostatic hypotension, HTN, DDD, diabetic polyneuropathy, CKD stage 3; 01/24/18 acute  right temporal CVA, Chronic bil basal ganglia and cerebellar infarcts, Advanced atherosclerosis involving the posterior circulation, chronic rt chest pain s/p CABG 2017    Diagnostic tests  MRI 02/23/18 acute left splenium infarct ; MRI 01/24/18 acute right temporal CVA    Patient Stated Goals  to not feel dizzy all the time; to be able to work on his cars    Currently in Pain?  Yes    Pain Score  7     Pain Location  Back    Pain Orientation  Left;Mid    Pain Descriptors / Indicators  Spasm    Pain Type  Chronic pain    Pain Onset  More than a month ago    Pain Frequency  Constant    Aggravating Factors   sitting in hard back chair     Pain Relieving Factors  nothing right now; sometimes chiropractor         [facility] PT Assessment - 05/16/18 0001      Ambulation/Gait   Gait velocity  32.8/12.22=2.68  Ft/sec with SPC     Timed Up and Go Test   Normal TUG (seconds)  13.1                   [facility] Adult PT Treatment/Exercise - 05/16/18 0001      Ambulation/Gait   Ambulation/Gait Assistance  4: Min guard    Ambulation/Gait Assistance Details  off-balance despite cane; close guard for safety    Ambulation Distance (Feet)  50 Feet   120, and throughout gym   Assistive device  Straight cane    Gait Pattern  Step-through pattern;Decreased arm swing - left;Decreased trunk rotation;Trunk flexed;Narrow base of support;Wide base of support;Poor foot clearance - left;Poor foot clearance - right    Ambulation Surface  Level;Indoor      Knee/Hip Exercises: Aerobic   Stepper  Sci-Fit legs only;  12 minutes BP checks every 3 minutes             PT Education - 05/16/18 1715    Education Details  met 3 of 3 STGs; BP (esp DBP) was under threshold to continue seated aerobic activity with legs ONLY and feel OK [facility] and using their home BP machine to monitor him     Person(s) Educated  Patient;Spouse    Methods  Explanation    Comprehension  Verbalized understanding       PT Short Term Goals - 05/16/18 1010      PT SHORT TERM GOAL #1   Title  independent with initial HEP (Target for all STGs 05/16/2018)    Time  4    Period  Weeks    Status  Achieved      PT SHORT TERM GOAL #2   Title  Patient will ambulate with LRAD @ gait velocity of 2.62 ft/sec (indicative of velocity needed for safe community ambulation)    Baseline  04/16/18  2.16 ft/sec ; 05/16/18 2.68 ft/sec    Time  4    Period  Weeks    Status  Achieved      PT SHORT TERM GOAL #3   Title  Pt will improve TUG score to <=17 sec demonstrating a lesser fall risk.     Baseline  12/16  TUG 20.88  sec; 05/16/18 13.1 sec    Time  4    Period  Weeks    Status  Achieved  PT Long Term Goals - 05/16/18 1749      PT LONG TERM GOAL #1   Title  independent with updated HEP (Target all LTGs 06/15/2018)    Time  8    Period  Weeks    Status  New      PT LONG TERM GOAL #2   Title  Score on [place] will decr >=18 points    Baseline  04/24/18   72    Time  8    Period  Weeks    Status  On-going      PT LONG TERM GOAL #3   Title  Gait velocity will improve to >=2.8 ft/sec to demonstrate lesser fall risk.     Time  8    Period  Weeks    Status  On-going      PT LONG TERM GOAL #4   Title  Pt will perform TUG in <=15 sec    Baseline  05/16/18  13.1 sec with SPC    Time  8    Period  Weeks    Status  Achieved            Plan - 05/16/18 1719    Clinical Impression Statement  STGs assessed with pt meeting 3 of 3 goals (and even met LTG for TUG!). Despite his sense that things have not improved, the objective measures show otherwise. He wants to improve and feels like the changes to his vision are the main thing effecting his balance. Patient's vital signs again assesed during seated stepper/aerobic activity and with pt using only his legs, his DBP <=92 throughout. As compared to previous attempt when he used all 4 extremities and DBP>100. Encouraged patient to continue to do HEP and [facility]. Pt can continue to benefit from PT to work towards [place].     Rehab Potential  Good    Clinical Impairments Affecting Rehab Potential  dizziness present >2 yrs    PT Frequency  2x / week    PT Duration  8 weeks    PT Treatment/Interventions  ADLs/Self Care Home Management;Canalith Repostioning;DME Instruction;Gait training;Stair training;Functional mobility training;Therapeutic activities;Therapeutic exercise;Balance training;Patient/family education;Neuromuscular re-education;Manual techniques;Vestibular;Visual/perceptual remediation/compensation    PT  Next Visit Plan  Monitor BP pre- and post-aerobic exercise (do Sci-Fit with LEs only); ?add habituation activity to HEP; work on hip and step strategy; continue to challenge vestibular system; ;      Consulted and Agree with Plan of Care  Patient;Family member/caregiver    Family Member Consulted  wife, [name]       Patient will benefit from skilled therapeutic intervention in order to improve the following deficits and impairments:  Abnormal gait, Cardiopulmonary status limiting activity, Decreased activity tolerance, Decreased balance, Decreased knowledge of use of DME, Decreased mobility, Decreased safety awareness, Dizziness, Impaired perceived functional ability, Impaired vision/preception, Impaired sensation, Postural dysfunction  Visit Diagnosis: Dizziness and giddiness  Other symptoms and signs involving the nervous system  Other abnormalities of gait and mobility     Problem List Patient Active Problem List   Diagnosis Date Noted  . History of lacunar cerebrovascular accident (CVA) 02/12/2018  . Dizziness 02/12/2018  . Myofascial pain 12/18/2017  . Spondylosis without myelopathy or radiculopathy, lumbar region 12/18/2017  . Lumbar radiculopathy, right 12/18/2017  . Cholelithiasis 05/17/2017  . Sinus Bradycardia 05/17/2017  . Thoracic radiculopathy 04/12/2017  . Primary osteoarthritis of right knee 12/14/2016  . DDD (degenerative disc disease), lumbar 10/10/2016  .  Lumbar facet arthropathy 08/29/2016  . Idiopathic scoliosis 03/22/2016  . Diabetic neuropathy ([place]) 03/22/2016  . Dysautonomia orthostatic hypotension syndrome ([place]) 02/25/2016  . CKD (chronic kidney disease), stage III ([place]) 02/19/2016  . Coronary artery disease involving coronary bypass graft of native heart without angina pectoris   . Diabetes mellitus type 2, diet-controlled ([place])   . 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 PVD (peripheral vascular disease) ([place]) 01/01/2014  .  Mixed hyperlipidemia 04/11/2013  . Supine hypertension   . Vitamin D deficiency   . Vitamin B 12 deficiency   . Coronary atherosclerosis- s/p PCI to LAD in 2009 and PCI to RCA in 2011 02/27/2009  . Abdominal aortic aneurysm ([place]) 02/27/2009  . GERD 02/27/2009  . History of IBS 02/27/2009    Rexanne Mano, PT 05/16/2018, 5:51 PM  [facility] [address] [place], [place], [address] Phone: [phone]   Fax:  [phone]  Name: CHANNON AMBROSINI MRN: [id number] Date of Birth: Apr 14, 1934

## 2018-05-18 ENCOUNTER — Ambulatory Visit: Payer: Medicare Other | Admitting: Physical Therapy

## 2018-05-18 ENCOUNTER — Encounter: Payer: Self-pay | Admitting: Physical Therapy

## 2018-05-18 VITALS — BP 158/98

## 2018-05-18 DIAGNOSIS — R29818 Other symptoms and signs involving the nervous system: Secondary | ICD-10-CM

## 2018-05-18 DIAGNOSIS — R42 Dizziness and giddiness: Secondary | ICD-10-CM

## 2018-05-18 DIAGNOSIS — M6281 Muscle weakness (generalized): Secondary | ICD-10-CM | POA: Diagnosis not present

## 2018-05-18 DIAGNOSIS — R2689 Other abnormalities of gait and mobility: Secondary | ICD-10-CM

## 2018-05-19 NOTE — Therapy (Signed)
[facility] [address] [place], [place], [address] Phone: [phone]   Fax:  [phone]  Physical Therapy Treatment  Patient Details  Name: [name] MRN: [id number] Date of Birth: 1933-09-15 Referring Provider (PT): Venancio Poisson, NP   Encounter Date: 05/18/2018  PT End of Session - 05/18/18 0934    Visit Number  9    Number of Visits  17    Date for PT Re-Evaluation  05/16/18    Authorization Type  [employer]HC Medicare    Authorization Time Period  04/16/18 to 07/15/2018    PT Start Time  0934    PT Stop Time  1015    PT Time Calculation (min)  41 min    Equipment Utilized During Treatment  Gait belt    Activity Tolerance  Patient tolerated treatment well    Behavior During Therapy  [facility] for tasks assessed/performed       Past Medical History:  Diagnosis Date  . 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 Colon polyps   . Coronary atherosclerosis of unspecified type of vessel, native or graft   . Diabetes ([place])   . Difficult intubation   . Esophageal reflux   . High cholesterol   . Hypertension    pt denies, he says he has a h/o hypotension. If BP up he adjusts the Flor[place]f  . IBS (irritable bowel syndrome)   . Orthostatic hypotension    "BP has been dropping alot when I stand up for the last month or so" (02/17/2016)  . Vitamin B 12 deficiency   . Vitamin D deficiency     Past Surgical History:  Procedure Laterality Date  . BLADDER SURGERY  1969   traumatic pelvic fractures, urethral and bladder repair  . CARDIAC CATHETERIZATION N/A 07/01/2015   Procedure: Left Heart Cath and Coronary Angiography;  Surgeon: Wellington Hampshire, MD;  Location: [facility];  Service: Cardiovascular;  Laterality: N/A;  . COLON RESECTION N/A 05/17/2017   Procedure: DIAGNOSTIC LAPAROSCOPY,;  Surgeon: Leighton Ruff, MD;  [facility];  Service: General;  Laterality: N/A;  . CORONARY ANGIOPLASTY WITH STENT PLACEMENT     . CORONARY ARTERY BYPASS GRAFT N/A 07/06/2015   Procedure: CORONARY ARTERY BYPASS GRAFTING (CABG)x 4   utilizing the left internal mammary artery and endoscopically harvested bilateral  sapheneous vein.;  Surgeon: Ivin Poot, MD;  Location: [facility];  Service: Open Heart Surgery;  Laterality: N/A;  . KNEE SURGERY    . LOOP RECORDER INSERTION N/A 02/19/2018   Procedure: LOOP RECORDER INSERTION;  Surgeon: Deboraha Sprang, MD;  Location: [facility];  Service: Cardiovascular;  Laterality: N/A;  . TEE WITHOUT CARDIOVERSION N/A 07/06/2015   Procedure: TRANSESOPHAGEAL ECHOCARDIOGRAM (TEE);  Surgeon: Ivin Poot, MD;  Location: [facility];  Service: Open Heart Surgery;  Laterality: N/A;    Vitals:   05/18/18 0936  BP: (!) 158/98    Subjective Assessment - 05/18/18 0936    Subjective  Noticed his vision got much better for the past two days. Can actually read the paper and the Bible again!     Patient is accompained by:  Family member   wife, [name]   Pertinent History  dysautonomia orthostatic hypotension, HTN, DDD, diabetic polyneuropathy, CKD stage 3; 01/24/18 acute right temporal CVA, Chronic bil basal ganglia and cerebellar infarcts, Advanced atherosclerosis involving the posterior circulation, chronic rt chest pain s/p CABG 2017    Diagnostic tests  MRI 02/23/18  acute left splenium infarct ; MRI 01/24/18 acute right temporal CVA    Patient Stated Goals  to not feel dizzy all the time; to be able to work on his cars    Currently in Pain?  Yes    Pain Score  4     Pain Location  Back    Pain Orientation  Left    Pain Descriptors / Indicators  Spasm    Pain Type  Chronic pain    Pain Onset  More than a month ago    Pain Frequency  Constant                       [facility] Adult PT Treatment/Exercise - 05/18/18 1700      Ambulation/Gait   Ambulation/Gait Assistance  4: Min guard    Ambulation/Gait Assistance Details  much more steady (still with cane) but able to work on  head turns and nods while walking in hallway with no LOB; +wide BOS    Ambulation Distance (Feet)  80 Feet   120, 160   Assistive device  Straight cane    Gait Pattern  Step-through pattern;Decreased trunk rotation;Trunk flexed;Wide base of support;Decreased stride length      Knee/Hip Exercises: Aerobic   Stepper  Sci-Fit legs only x 3 minutes (end of session) and DBP elevated from 92 to 100           Balance Exercises - 05/18/18 0951      Balance Exercises: Standing   Standing Eyes Opened  Narrow base of support (BOS);Head turns;Solid surface    Standing Eyes Closed  Narrow base of support (BOS);Head turns;Solid surface   feet 1" apart   Wall Bumps  Hip    Wall Bumps-Hips  Eyes opened;Anterior/posterior;10 reps    Stepping Strategy  Anterior;Posterior;Lateral;Foam/compliant surface;UE support;5 reps   each leg   Gait with Head Turns  Forward;Upper extremity support   with cane   Retro Gait  Foam/compliant surface;Upper extremity support    Sidestepping  Foam/compliant support   blue mat   Marching Limitations  slow march, blue mat 1 UE support    Other Standing Exercises  ankle sway anterior, posterior          PT Short Term Goals - 05/16/18 1010      PT SHORT TERM GOAL #1   Title  independent with initial HEP (Target for all STGs 05/16/2018)    Time  4    Period  Weeks    Status  Achieved      PT SHORT TERM GOAL #2   Title  Patient will ambulate with LRAD @ gait velocity of 2.62 ft/sec (indicative of velocity needed for safe community ambulation)    Baseline  04/16/18  2.16 ft/sec ; 05/16/18 2.68 ft/sec    Time  4    Period  Weeks    Status  Achieved      PT SHORT TERM GOAL #3   Title  Pt will improve TUG score to <=17 sec demonstrating a lesser fall risk.     Baseline  12/16  TUG 20.88 sec; 05/16/18 13.1 sec    Time  4    Period  Weeks    Status  Achieved        PT Long Term Goals - 05/16/18 1749      PT LONG TERM GOAL #1   Title  independent with  updated HEP (Target all LTGs 06/15/2018)    Time  8    Period  Weeks    Status  New      PT LONG TERM GOAL #2   Title  Score on [place] will decr >=18 points    Baseline  04/24/18   72    Time  8    Period  Weeks    Status  On-going      PT LONG TERM GOAL #3   Title  Gait velocity will improve to >=2.8 ft/sec to demonstrate lesser fall risk.     Time  8    Period  Weeks    Status  On-going      PT LONG TERM GOAL #4   Title  Pt will perform TUG in <=15 sec    Baseline  05/16/18  13.1 sec with SPC    Time  8    Period  Weeks    Status  Achieved            Plan - 05/18/18 1700    Clinical Impression Statement  Patient reporting his vision improved 2 days ago and he has been doing better. Tolerated more difficult balance challenges with much less sway and able to recover independently. DBP did again quickly elevate with only 3 minutes of seated stepper (using legs only). Overall pt continues to improve and benefit from OPPT.     Rehab Potential  Good    Clinical Impairments Affecting Rehab Potential  dizziness present >2 yrs    PT Frequency  2x / week    PT Duration  8 weeks    PT Treatment/Interventions  ADLs/Self Care Home Management;Canalith Repostioning;DME Instruction;Gait training;Stair training;Functional mobility training;Therapeutic activities;Therapeutic exercise;Balance training;Patient/family education;Neuromuscular re-education;Manual techniques;Vestibular;Visual/perceptual remediation/compensation    PT Next Visit Plan  update HEP (add counter ex's--side step, ?backwards walk, etc) ?add habituation activity to HEP; work on hip and step strategy; continue to challenge vestibular system; Monitor BP pre- and post-aerobic exercise (do Sci-Fit with LEs only);    Consulted and Agree with Plan of Care  Patient;Family member/caregiver    Family Member Consulted  wife, [name]       Patient will benefit from skilled therapeutic intervention in order to improve the following  deficits and impairments:  Abnormal gait, Cardiopulmonary status limiting activity, Decreased activity tolerance, Decreased balance, Decreased knowledge of use of DME, Decreased mobility, Decreased safety awareness, Dizziness, Impaired perceived functional ability, Impaired vision/preception, Impaired sensation, Postural dysfunction  Visit Diagnosis: Dizziness and giddiness  Other symptoms and signs involving the nervous system  Other abnormalities of gait and mobility     Problem List Patient Active Problem List   Diagnosis Date Noted  . History of lacunar cerebrovascular accident (CVA) 02/12/2018  . Dizziness 02/12/2018  . Myofascial pain 12/18/2017  . Spondylosis without myelopathy or radiculopathy, lumbar region 12/18/2017  . Lumbar radiculopathy, right 12/18/2017  . Cholelithiasis 05/17/2017  . Sinus Bradycardia 05/17/2017  . Thoracic radiculopathy 04/12/2017  . Primary osteoarthritis of right knee 12/14/2016  . DDD (degenerative disc disease), lumbar 10/10/2016  . Lumbar facet arthropathy 08/29/2016  . Idiopathic scoliosis 03/22/2016  . Diabetic neuropathy ([place]) 03/22/2016  . Dysautonomia orthostatic hypotension syndrome ([place]) 02/25/2016  . CKD (chronic kidney disease), stage III ([place]) 02/19/2016  . Coronary artery disease involving coronary bypass graft of native heart without angina pectoris   . Diabetes mellitus type 2, diet-controlled ([place])   . 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 PVD (peripheral vascular disease) ([place]) 01/01/2014  . Mixed hyperlipidemia 04/11/2013  .  Supine hypertension   . Vitamin D deficiency   . Vitamin B 12 deficiency   . Coronary atherosclerosis- s/p PCI to LAD in 2009 and PCI to RCA in 2011 02/27/2009  . Abdominal aortic aneurysm ([place]) 02/27/2009  . GERD 02/27/2009  . History of IBS 02/27/2009    Rexanne Mano, PT 05/19/2018, 8:40 AM  [facility] [address] [place], [place], [address] Phone: [phone]   Fax:  [phone]  Name: [name] MRN: [id number] Date of Birth: 1933/06/11

## 2018-05-21 ENCOUNTER — Encounter: Payer: Self-pay | Admitting: Physical Therapy

## 2018-05-21 ENCOUNTER — Ambulatory Visit: Payer: Medicare Other | Admitting: Physical Therapy

## 2018-05-21 DIAGNOSIS — R42 Dizziness and giddiness: Secondary | ICD-10-CM | POA: Diagnosis not present

## 2018-05-21 DIAGNOSIS — M9902 Segmental and somatic dysfunction of thoracic region: Secondary | ICD-10-CM | POA: Diagnosis not present

## 2018-05-21 DIAGNOSIS — R29818 Other symptoms and signs involving the nervous system: Secondary | ICD-10-CM | POA: Diagnosis not present

## 2018-05-21 DIAGNOSIS — M6281 Muscle weakness (generalized): Secondary | ICD-10-CM | POA: Diagnosis not present

## 2018-05-21 DIAGNOSIS — R2689 Other abnormalities of gait and mobility: Secondary | ICD-10-CM | POA: Diagnosis not present

## 2018-05-21 DIAGNOSIS — M5134 Other intervertebral disc degeneration, thoracic region: Secondary | ICD-10-CM | POA: Diagnosis not present

## 2018-05-21 NOTE — Therapy (Signed)
[facility] [address] [place], [place], [address] Phone: [phone]   Fax:  [phone]  Physical Therapy Treatment and 10th visit Progress Note  Patient Details  Name: [name] MRN: [id number] Date of Birth: [birth year] Referring Provider (PT): Venancio Poisson, NP   Encounter Date: 05/21/2018   This note covers dates 04/16/18 to 05/21/17   PT End of Session - 05/21/18 1021    Visit Number  10    Number of Visits  17    Date for PT Re-Evaluation  05/16/18    Authorization Type  [employer] Medicare    Authorization Time Period  04/16/18 to 07/15/2018    PT Start Time  0938    PT Stop Time  1021    PT Time Calculation (min)  43 min    Equipment Utilized During Treatment  Gait belt    Activity Tolerance  Patient tolerated treatment well    Behavior During Therapy  [facility] for tasks assessed/performed       Past Medical History:  Diagnosis Date  . 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 Colon polyps   . Coronary atherosclerosis of unspecified type of vessel, native or graft   . Diabetes ([place])   . Difficult intubation   . Esophageal reflux   . High cholesterol   . Hypertension    pt denies, he says he has a h/o hypotension. If BP up he adjusts the Florinef  . IBS (irritable bowel syndrome)   . Orthostatic hypotension    "BP has been dropping alot when I stand up for the last month or so" (02/17/2016)  . Vitamin B 12 deficiency   . Vitamin D deficiency     Past Surgical History:  Procedure Laterality Date  . BLADDER SURGERY  1969   traumatic pelvic fractures, urethral and bladder repair  . CARDIAC CATHETERIZATION N/A 07/01/2015   Procedure: Left Heart Cath and Coronary Angiography;  Surgeon: Wellington Hampshire, MD;  Location: [facility];  Service: Cardiovascular;  Laterality: N/A;  . COLON RESECTION N/A 05/17/2017   Procedure: DIAGNOSTIC LAPAROSCOPY,;  Surgeon: Leighton Ruff, MD;  Location: [facility];   Service: General;  Laterality: N/A;  . CORONARY ANGIOPLASTY WITH STENT PLACEMENT    . CORONARY ARTERY BYPASS GRAFT N/A 07/06/2015   Procedure: CORONARY ARTERY BYPASS GRAFTING (CABG)x 4   utilizing the left internal mammary artery and endoscopically harvested bilateral  sapheneous vein.;  Surgeon: Ivin Poot, MD;  Location: [facility];  Service: Open Heart Surgery;  Laterality: N/A;  . KNEE SURGERY    . LOOP RECORDER INSERTION N/A 02/19/2018   Procedure: LOOP RECORDER INSERTION;  Surgeon: Deboraha Sprang, MD;  Location: [facility];  Service: Cardiovascular;  Laterality: N/A;  . TEE WITHOUT CARDIOVERSION N/A 07/06/2015   Procedure: TRANSESOPHAGEAL ECHOCARDIOGRAM (TEE);  Surgeon: Ivin Poot, MD;  Location: [facility];  Service: Open Heart Surgery;  Laterality: N/A;    There were no vitals filed for this visit.  Subjective Assessment - 05/21/18 0940    Subjective  Started yesterday morning: pain in rt groin, thigh, and rt knee. Doesn't recall doing anything different activity wise.     Patient is accompained by:  Family member   wife, [name]   Pertinent History  dysautonomia orthostatic hypotension, HTN, DDD, diabetic polyneuropathy, CKD stage 3; 01/24/18 acute right temporal CVA, Chronic bil basal ganglia and cerebellar infarcts, Advanced atherosclerosis involving the posterior circulation, chronic rt chest pain s/p  CABG 2017    Diagnostic tests  MRI 02/23/18 acute left splenium infarct ; MRI 01/24/18 acute right temporal CVA    Patient Stated Goals  to not feel dizzy all the time; to be able to work on his cars    Currently in Pain?  Yes    Pain Score  5     Pain Location  Groin    Pain Orientation  Right    Pain Descriptors / Indicators  Aching    Pain Type  Acute pain    Pain Radiating Towards  groin, anterior thigh, knee    Pain Onset  In the past 7 days    Pain Frequency  Constant    Aggravating Factors   sleeping-wakes him up    Pain Relieving Factors  biofreeze     Multiple Pain  Sites  Yes    Pain Score  5    Pain Location  Knee    Pain Orientation  Lateral    Pain Descriptors / Indicators  Aching    Pain Type  Acute pain    Pain Onset  In the past 7 days    Pain Frequency  Constant    Aggravating Factors   sleeping    Pain Relieving Factors  maybe biofreeze      Examined RLE conmpared to LLE for any signs of edema or redness (none present). Unclear source of pain, do not see reason for concern re: ?DVT.                  [facility] Adult PT Treatment/Exercise - 05/21/18 1023      Ambulation/Gait   Ambulation/Gait Assistance  4: Min guard    Ambulation/Gait Assistance Details  walking in gym without cane with slight drift to his left (especially noted with left head turns)    Ambulation Distance (Feet)  80 Feet   160, 120, 60 x 2   Assistive device  Straight cane;None    Gait Pattern  Step-through pattern;Decreased trunk rotation;Trunk flexed;Wide base of support;Decreased stride length;Decreased dorsiflexion - right;Decreased dorsiflexion - left   mild foot slap bil   Gait Comments  pt able to incr stride length and maintain with cues; head turns and nods every 3rd step      Posture/Postural Control   Posture/Postural Control  Postural limitations    Postural Limitations  Rounded Shoulders;Forward head;Increased thoracic kyphosis;Decreased lumbar lordosis;Posterior pelvic tilt          Balance Exercises - 05/21/18 1025      Balance Exercises: Standing   Standing Eyes Opened  Wide (BOA);Head turns;Foam/compliant surface;Solid surface;Narrow base of support (BOS)   partial tandem, solid; foam, hip width   Standing Eyes Closed  Wide (BOA);Solid surface;30 secs    Stepping Strategy  Posterior;UE support   at sink   Step Ups  Forward;2 inch;Intermittent UE support   blue airex foam; backwards   Tandem Gait  Forward;Intermittent upper extremity support;4 reps   at counterj   Retro Gait  Upper extremity support;5 reps   at counter    Sidestepping  --   6 reps at counter   Marching Limitations  solid surface, for strengthening pt reporting difficulty lifting feet into the car        PT Education - 05/21/18 1030    Education Details  updates to HEP; continue to use cane at all times    Person(s) Educated  Patient;Spouse    Methods  Explanation;Demonstration;Verbal cues;Handout    Comprehension  Verbalized understanding;Returned demonstration;Verbal cues required       PT Short Term Goals - 05/16/18 1010      PT SHORT TERM GOAL #1   Title  independent with initial HEP (Target for all STGs 05/16/2018)    Time  4    Period  Weeks    Status  Achieved      PT SHORT TERM GOAL #2   Title  Patient will ambulate with LRAD @ gait velocity of 2.62 ft/sec (indicative of velocity needed for safe community ambulation)    Baseline  04/16/18  2.16 ft/sec ; 05/16/18 2.68 ft/sec    Time  4    Period  Weeks    Status  Achieved      PT SHORT TERM GOAL #3   Title  Pt will improve TUG score to <=17 sec demonstrating a lesser fall risk.     Baseline  12/16  TUG 20.88 sec; 05/16/18 13.1 sec    Time  4    Period  Weeks    Status  Achieved        PT Long Term Goals - 05/16/18 1749      PT LONG TERM GOAL #1   Title  independent with updated HEP (Target all LTGs 06/15/2018)    Time  8    Period  Weeks    Status  New      PT LONG TERM GOAL #2   Title  Score on DHI will decr >=18 points    Baseline  04/24/18   72    Time  8    Period  Weeks    Status  On-going      PT LONG TERM GOAL #3   Title  Gait velocity will improve to >=2.8 ft/sec to demonstrate lesser fall risk.     Time  8    Period  Weeks    Status  On-going      PT LONG TERM GOAL #4   Title  Pt will perform TUG in <=15 sec    Baseline  05/16/18  13.1 sec with SPC    Time  8    Period  Weeks    Status  Achieved            Plan - 05/21/18 1031    Clinical Impression Statement  Balance continues to improve. With improved vision, pt now able to  advance VORx 1 to standing with normal stance and no UE support. Patient very pleased with improvements. Continue to work towards [employer]  Good    Clinical Impairments Affecting Rehab Potential  dizziness present >2 yrs    PT Frequency  2x / week    PT Duration  8 weeks    PT Treatment/Interventions  ADLs/Self Care Home Management;Canalith Repostioning;DME Instruction;Gait training;Stair training;Functional mobility training;Therapeutic activities;Therapeutic exercise;Balance training;Patient/family education;Neuromuscular re-education;Manual techniques;Vestibular;Visual/perceptual remediation/compensation    PT Next Visit Plan  walk without cane in gym with PT minguard; work on hip and step strategy; continue to challenge vestibular system--compliant surfaces, EC; Monitor BP pre- and post-aerobic exercise (do Sci-Fit with LEs only);    Consulted and Agree with Plan of Care  Patient;Family member/caregiver    Family Member Consulted  wife, [name]       Patient will benefit from skilled therapeutic intervention in order to improve the following deficits and impairments:  Abnormal gait, Cardiopulmonary status limiting activity, Decreased activity tolerance, Decreased balance, Decreased knowledge of use of DME, Decreased mobility, Decreased  safety awareness, Dizziness, Impaired perceived functional ability, Impaired vision/preception, Impaired sensation, Postural dysfunction  Visit Diagnosis: Dizziness and giddiness  Other symptoms and signs involving the nervous system  Other abnormalities of gait and mobility     Problem List Patient Active Problem List   Diagnosis Date Noted  . History of lacunar cerebrovascular accident (CVA) 02/12/2018  . Dizziness 02/12/2018  . Myofascial pain 12/18/2017  . Spondylosis without myelopathy or radiculopathy, lumbar region 12/18/2017  . Lumbar radiculopathy, right 12/18/2017  . Cholelithiasis 05/17/2017  . Sinus Bradycardia 05/17/2017   . Thoracic radiculopathy 04/12/2017  . Primary osteoarthritis of right knee 12/14/2016  . DDD (degenerative disc disease), lumbar 10/10/2016  . Lumbar facet arthropathy 08/29/2016  . Idiopathic scoliosis 03/22/2016  . Diabetic neuropathy ([place]) 03/22/2016  . Dysautonomia orthostatic hypotension syndrome ([place]) 02/25/2016  . CKD (chronic kidney disease), stage III ([place]) 02/19/2016  . Coronary artery disease involving coronary bypass graft of native heart without angina pectoris   . Diabetes mellitus type 2, diet-controlled ([place])   . 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 PVD (peripheral vascular disease) ([place]) 01/01/2014  . Mixed hyperlipidemia 04/11/2013  . Supine hypertension   . Vitamin D deficiency   . Vitamin B 12 deficiency   . Coronary atherosclerosis- s/p PCI to LAD in 2009 and PCI to RCA in 2011 02/27/2009  . Abdominal aortic aneurysm ([place]) 02/27/2009  . GERD 02/27/2009  . History of IBS 02/27/2009    [name], PT 05/21/2018, 10:34 AM  [facility] [address] [place], [place], [address] Phone: [phone]   Fax:  [phone]  Name: IZYAN EZZELL MRN: [id number] Date of Birth: January 31, 1934

## 2018-05-21 NOTE — Patient Instructions (Addendum)
Gaze Stabilization: Tip Card  1.Target must remain in focus, not blurry, and appear stationary while head is in motion. 2.Perform exercises with small head movements (30 to either side of midline). 3.Increase speed of head motion so long as target is in focus. 4.If you wear eyeglasses, be sure you can see target through lens (therapist will give specific instructions for bifocal / progressive lenses). 5.These exercises should provoke dizziness or nausea. Work through these symptoms. If too dizzy, slow head movement slightly. (Too dizzy is moderately dizzy or 5 out of 10 with a 10 meaning you're so dizzy you are falling).  Overall the symptoms of dizziness or nausea should resolve within 30 minutes of completing all repetitions. 6.Exercises demand concentration; avoid distractions. Begin with the target on a simple background unless instructed otherwise.      Special Instructions:  If symptoms are lasting longer than 30 minutes, modify your exercises by:               >decreasing the # of times you complete each activity >ensuring your symptoms return to baseline before moving onto the next exercise >dividing up exercises so you do not do them all in one session, but multiple short sessions throughout the day >doing them once a day until symptoms improve     Gaze Stabilization - Tip Card  For safety, perform standing exercises close to a counter, wall, corner, or next to someone.   Gaze Stabilization - Standing Feet Apart   Feet shoulder width apart, keeping eyes on target on wall 3 -5 feet away, tilt head down slightly and move head side to side for _30-60___ seconds. Rest and repeat side to side. Then repeat while moving head up and down for __30-60__ seconds. Rest and repeat up and down. (ie do each direction two times)  Do __2-3__ sessions per day.   Backward    At the kitchen counter, walk backwards with eyes open. Take even steps, making sure each foot lifts  off floor. Repeat for ___5 lengths_ Do _1-2___ sessions per day.

## 2018-05-22 NOTE — Progress Notes (Signed)
HPI: FU CAD, h/o HTN, postural hypotension and HLD. Cath 3/17revealed 3 V CAD, echo showed normal LVF. Preoperative evaluation included carotids and ABIs that were normal. He underwent CABG x 4 on 07/07/15. Post op course was unremarkable. He has had difficulties with orthostasis and hypotension sinceCABG.  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    Echocardiogram October 2019 showed normal LV function, mild diastolic dysfunction, mild left atrial enlargement.  Abdominal ultrasound October 2019 showed 3.8 cm abdominal aortic aneurysm.  Had loop monitor placed October 2019.  Since last seen,  patient denies dyspnea, chest pain, palpitations or syncope.  He continues to have orthostatic symptoms.  Current Outpatient Medications  Medication Sig Dispense Refill  . acetaminophen (TYLENOL) 500 MG tablet Take 1,000 mg by mouth as needed for mild pain.    Marland Kitchen aspirin 81 MG EC tablet TAKE 1 TABLET BY MOUTH EVERY DAY 31 tablet 0  . Blood Glucose Monitoring Suppl (ONE TOUCH ULTRA 2) w/Device KIT Check blood sugar 1 time daily-DX-E11.9. 1 each 0  . butalbital-acetaminophen-caffeine (FIORICET, ESGIC) 50-325-40 MG tablet Take 1 tablet every 4 hours ONLY if needed for severe Headache 30 tablet 0  . CALCIUM PO Take 1 tablet by mouth daily.    . Cholecalciferol (VITAMIN D-3) 5000 units TABS Take 5,000 Units by mouth daily.    . clopidogrel (PLAVIX) 75 MG tablet Take 1 tablet (75 mg total) by mouth daily. (Patient taking differently: Take 75 mg by mouth at bedtime. ) 30 tablet 11  . diclofenac sodium (VOLTAREN) 1 % GEL APPLY 2 GRAMS TOPICALLY TO AFFECTED AREA 4 TIMES DAILY 300 g 4  . fludrocortisone (FLORINEF) 0.1 MG tablet Take 0.1 mg by mouth daily.     Marland Kitchen gabapentin (NEURONTIN) 100 MG capsule TAKE 2 CAPSULE(200 MG) BY MOUTH 2 TIMES A DAY. (Patient taking differently: Take 200 mg by mouth 2 (two) times daily. TAKE 2 CAPSULE(200 MG) BY MOUTH 2 TIMES A DAY.) 360 capsule 3  .  glucose blood test strip Check blood sugar 1 time daily-DX-E11.9 100 each 5  . Multiple Vitamin (MULTIVITAMIN WITH MINERALS) TABS tablet Take 1 tablet by mouth daily.    Marland Kitchen olmesartan (BENICAR) 40 MG tablet Take 1/2 to 1 tablet daily for BP 30 tablet 2  . polyethylene glycol (MIRALAX / GLYCOLAX) packet Take 17 g by mouth daily. (Patient taking differently: Take 17 g by mouth 2 (two) times daily. ) 28 each 1  . predniSONE (DELTASONE) 20 MG tablet 3 tablets daily with food for 3 days, 2 tabs daily for 3 days, 1 tab a day for 5 days. 20 tablet 0   No current facility-administered medications for this visit.      Past Medical History:  Diagnosis Date  . 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 Colon polyps   . Coronary atherosclerosis of unspecified type of vessel, native or graft   . Diabetes ([place])   . Difficult intubation   . Esophageal reflux   . High cholesterol   . History of IBS 02/27/2009  . Hypertension    pt denies, he says he has a h/o hypotension. If BP up he adjusts the Florinef  . IBS (irritable bowel syndrome)   . Orthostatic hypotension    "BP has been dropping alot when I stand up for the last month or so" (02/17/2016)  . Vitamin B 12 deficiency   . Vitamin D deficiency     Past Surgical  History:  Procedure Laterality Date  . BLADDER SURGERY  1969   traumatic pelvic fractures, urethral and bladder repair  . CARDIAC CATHETERIZATION N/A 07/01/2015   Procedure: Left Heart Cath and Coronary Angiography;  Surgeon: Wellington Hampshire, MD;  Location: [facility];  Service: Cardiovascular;  Laterality: N/A;  . COLON RESECTION N/A 05/17/2017   Procedure: DIAGNOSTIC LAPAROSCOPY,;  Surgeon: Leighton Ruff, MD;  [facility];  Service: General;  Laterality: N/A;  . CORONARY ANGIOPLASTY WITH STENT PLACEMENT    . CORONARY ARTERY BYPASS GRAFT N/A 07/06/2015   Procedure: CORONARY ARTERY BYPASS GRAFTING (CABG)x 4   utilizing the left internal mammary artery and endoscopically harvested  bilateral  sapheneous vein.;  Surgeon: Ivin Poot, MD;  Location: [facility];  Service: Open Heart Surgery;  Laterality: N/A;  . KNEE SURGERY    . LOOP RECORDER INSERTION N/A 02/19/2018   Procedure: LOOP RECORDER INSERTION;  Surgeon: Deboraha Sprang, MD;  Location: [facility];  Service: Cardiovascular;  Laterality: N/A;  . TEE WITHOUT CARDIOVERSION N/A 07/06/2015   Procedure: TRANSESOPHAGEAL ECHOCARDIOGRAM (TEE);  Surgeon: Ivin Poot, MD;  Location: [facility];  Service: Open Heart Surgery;  Laterality: N/A;    Social History   Socioeconomic History  . Marital status: Married    Spouse name: Not on file  . Number of children: 2  . Years of education: Not on file  . Highest education level: High school graduate  Occupational History  . Occupation: Retired  Scientific laboratory technician  . Financial resource strain: Not on file  . Food insecurity:    Worry: Not on file    Inability: Not on file  . Transportation needs:    Medical: Not on file    Non-medical: Not on file  Tobacco Use  . Smoking status: Former Smoker    Last attempt to quit: 07/16/1963    Years since quitting: 54.8  . Smokeless tobacco: Former Systems developer    Types: Chew    Quit date: 1989  Substance and Sexual Activity  . Alcohol use: No  . Drug use: No  . Sexual activity: Not Currently  Lifestyle  . Physical activity:    Days per week: Not on file    Minutes per session: Not on file  . Stress: Not on file  Relationships  . Social connections:    Talks on phone: Not on file    Gets together: Not on file    Attends religious service: Not on file    Active member of club or organization: Not on file    Attends meetings of clubs or organizations: Not on file    Relationship status: Not on file  . Intimate partner violence:    Fear of current or ex partner: Not on file    Emotionally abused: Not on file    Physically abused: Not on file    Forced sexual activity: Not on file  Other Topics Concern  . Not on file  Social  History Narrative   Lives at home with his wife Estill Bamberg   Right handed   No caffeine    Family History  Problem Relation Age of Onset  . Heart attack Father        died age 45  . Heart attack Brother        died age 65  . Anuerysm Brother        aortic  . Heart attack Sister        died  age 61  . Colon cancer Sister   . Liver cancer Sister   . Diabetes Maternal Grandmother   . Colon polyps Sister        and brothers x 2     ROS: Recent pain in the right lower extremity but no fevers or chills, productive cough, hemoptysis, dysphasia, odynophagia, melena, hematochezia, dysuria, hematuria, rash, seizure activity, orthopnea, PND, pedal edema, claudication. Remaining systems are negative.  Physical Exam: Well-developed well-nourished in no acute distress.  Skin is warm and dry.  HEENT is normal.  Neck is supple.  Chest is clear to auscultation with normal expansion.  Cardiovascular exam is regular rate and rhythm.  Abdominal exam nontender or distended. No masses palpated. Extremities show no edema. neuro grossly intact  A/P  1 coronary artery disease-patient denies chest pain.  Continue medical therapy with aspirin.  He is intolerant to statins.  2 hyperlipidemia-patient is intolerant to statins.  He has also not tolerated Zetia or Repatha.  Continue diet.  3 orthostatic hypotension-symptoms are reasonably well controlled.  Continue Florinef.  4 abdominal aortic aneurysm-follow-up abdominal ultrasound October 2020.  5 prior CVA-implantable loop monitor in place.  Kirk Ruths, MD

## 2018-05-22 NOTE — Progress Notes (Signed)
Thank you for the update and treating Mr Vink.  Keane Scrape, NP

## 2018-05-23 ENCOUNTER — Encounter: Payer: Self-pay | Admitting: Physical Therapy

## 2018-05-23 ENCOUNTER — Encounter: Payer: Self-pay | Admitting: Adult Health

## 2018-05-23 ENCOUNTER — Ambulatory Visit: Payer: Medicare Other | Admitting: Physical Therapy

## 2018-05-23 VITALS — BP 127/77 | HR 103

## 2018-05-23 DIAGNOSIS — R29818 Other symptoms and signs involving the nervous system: Secondary | ICD-10-CM | POA: Diagnosis not present

## 2018-05-23 DIAGNOSIS — R2689 Other abnormalities of gait and mobility: Secondary | ICD-10-CM

## 2018-05-23 DIAGNOSIS — M6281 Muscle weakness (generalized): Secondary | ICD-10-CM | POA: Diagnosis not present

## 2018-05-23 DIAGNOSIS — R42 Dizziness and giddiness: Secondary | ICD-10-CM | POA: Diagnosis not present

## 2018-05-23 DIAGNOSIS — M5134 Other intervertebral disc degeneration, thoracic region: Secondary | ICD-10-CM | POA: Diagnosis not present

## 2018-05-23 DIAGNOSIS — M9902 Segmental and somatic dysfunction of thoracic region: Secondary | ICD-10-CM | POA: Diagnosis not present

## 2018-05-23 NOTE — Therapy (Signed)
[facility] [address] [place], [place], [address] Phone: [phone]   Fax:  [phone]  Physical Therapy Treatment  Patient Details  Name: [name] MRN: [id number] Date of Birth: 1933-12-22 Referring Provider (PT): Venancio Poisson, NP   Encounter Date: 05/23/2018  PT End of Session - 05/23/18 1224    Visit Number  11    Number of Visits  17    Date for PT Re-Evaluation  05/16/18    Authorization Type  [employer] Medicare    Authorization Time Period  04/16/18 to 07/15/2018    PT Start Time  0934    PT Stop Time  1015    PT Time Calculation (min)  41 min    Equipment Utilized During Treatment  Gait belt    Activity Tolerance  Patient tolerated treatment well    Behavior During Therapy  [facility] for tasks assessed/performed       Past Medical History:  Diagnosis Date  . 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 Colon polyps   . Coronary atherosclerosis of unspecified type of vessel, native or graft   . Diabetes ([place])   . Difficult intubation   . Esophageal reflux   . High cholesterol   . Hypertension    pt denies, he says he has a h/o hypotension. If BP up he adjusts the [place]  . IBS (irritable bowel syndrome)   . Orthostatic hypotension    "BP has been dropping alot when I stand up for the last month or so" (02/17/2016)  . Vitamin B 12 deficiency   . Vitamin D deficiency     Past Surgical History:  Procedure Laterality Date  . BLADDER SURGERY  1969   traumatic pelvic fractures, urethral and bladder repair  . CARDIAC CATHETERIZATION N/A 07/01/2015   Procedure: Left Heart Cath and Coronary Angiography;  Surgeon: Wellington Hampshire, MD;  Location: [facility];  Service: Cardiovascular;  Laterality: N/A;  . COLON RESECTION N/A 05/17/2017   Procedure: DIAGNOSTIC LAPAROSCOPY,;  Surgeon: Leighton Ruff, MD;  Location: [facility];  Service: General;  Laterality: N/A;  . CORONARY ANGIOPLASTY WITH STENT PLACEMENT     . CORONARY ARTERY BYPASS GRAFT N/A 07/06/2015   Procedure: CORONARY ARTERY BYPASS GRAFTING (CABG)x 4   utilizing the left internal mammary artery and endoscopically harvested bilateral  sapheneous vein.;  Surgeon: Ivin Poot, MD;  Location: [facility];  Service: Open Heart Surgery;  Laterality: N/A;  . KNEE SURGERY    . LOOP RECORDER INSERTION N/A 02/19/2018   Procedure: LOOP RECORDER INSERTION;  Surgeon: Deboraha Sprang, MD;  Location: [facility];  Service: Cardiovascular;  Laterality: N/A;  . TEE WITHOUT CARDIOVERSION N/A 07/06/2015   Procedure: TRANSESOPHAGEAL ECHOCARDIOGRAM (TEE);  Surgeon: Ivin Poot, MD;  Location: [facility];  Service: Open Heart Surgery;  Laterality: N/A;    Vitals:   05/23/18 0958 05/23/18 1001 05/23/18 1006 05/23/18 1014  BP: (!) 190/98 (!) 160/95 (!) 162/92 127/77  Pulse: 81 91 95 (!) 103    Subjective Assessment - 05/23/18 0936    Subjective  States that R groin, thigh and knee are still bothering him.  Worse this morning than previous.  Usually decreases as the day goes on.    Patient is accompained by:  Family member   wife, [name]   Pertinent History  dysautonomia orthostatic hypotension, HTN, DDD, diabetic polyneuropathy, CKD stage 3; 01/24/18 acute right temporal CVA, Chronic bil basal ganglia and cerebellar  infarcts, Advanced atherosclerosis involving the posterior circulation, chronic rt chest pain s/p CABG 2017    Diagnostic tests  MRI 02/23/18 acute left splenium infarct ; MRI 01/24/18 acute right temporal CVA    Patient Stated Goals  to not feel dizzy all the time; to be able to work on his cars    Currently in Pain?  Yes    Pain Score  9     Pain Location  Groin    Pain Orientation  Right    Pain Descriptors / Indicators  Sharp    Pain Type  Acute pain    Pain Radiating Towards  groin, anterior thigh, knee    Pain Onset  In the past 7 days    Pain Frequency  Constant    Aggravating Factors   sleeping    Pain Relieving Factors  biofreeze,  moving around    Multiple Pain Sites  Yes    Pain Score  7    Pain Location  Knee    Pain Orientation  Right    Pain Descriptors / Indicators  Aching    Pain Type  Acute pain    Pain Onset  In the past 7 days    Pain Frequency  Constant    Aggravating Factors   unsure    Pain Relieving Factors  biofreeze         [facility] Adult PT Treatment/Exercise - 05/23/18 0001      Transfers   Transfers  Sit to Stand;Stand to Sit    Sit to Stand  4: Min guard    Stand to Sit  4: Min guard      Ambulation/Gait   Ambulation/Gait Assistance  4: Min guard    Ambulation/Gait Assistance Details  walking in gym without cane    Ambulation Distance (Feet)  80 Feet   x 3   Assistive device  Straight cane;None    Gait Pattern  Step-through pattern;Decreased trunk rotation;Trunk flexed;Wide base of support;Decreased stride length;Decreased dorsiflexion - right;Decreased dorsiflexion - left    Gait Comments  very guarded/cautious with gait  Veers at times.      Posture/Postural Control   Posture/Postural Control  Postural limitations    Postural Limitations  Rounded Shoulders;Forward head;Increased thoracic kyphosis;Decreased lumbar lordosis;Posterior pelvic tilt      Knee/Hip Exercises: Aerobic   Other Aerobic  Scifit level 1.5 LE's only x 5 minutes          Balance Exercises - 05/23/18 1215      Balance Exercises: Standing   Standing Eyes Opened  Narrow base of support (BOS);Wide (BOA);Head turns;Solid surface;3 reps;10 secs;Other (comment)   3 reps x 10 seconds static;, head turns/nods x 10 reps   Standing Eyes Closed  Wide (BOA);Head turns;Solid surface;Other (comment)   head turns/nods x 10 reps each;static 3 reps x 10 seconds       PT Education - 05/23/18 1222    Education Details  BP readings during session, monitoring BP if pt returns to [facility] to use recumbent bike (with wife's supervision)    Person(s) Educated  Patient;Spouse   [name]   Methods  Explanation;Verbal cues     Comprehension  Verbalized understanding       PT Short Term Goals - 05/16/18 1010      PT SHORT TERM GOAL #1   Title  independent with initial HEP (Target for all STGs 05/16/2018)    Time  4    Period  Weeks    Status  Achieved      PT SHORT TERM GOAL #2   Title  Patient will ambulate with LRAD @ gait velocity of 2.62 ft/sec (indicative of velocity needed for safe community ambulation)    Baseline  04/16/18  2.16 ft/sec ; 05/16/18 2.68 ft/sec    Time  4    Period  Weeks    Status  Achieved      PT SHORT TERM GOAL #3   Title  Pt will improve TUG score to <=17 sec demonstrating a lesser fall risk.     Baseline  12/16  TUG 20.88 sec; 05/16/18 13.1 sec    Time  4    Period  Weeks    Status  Achieved        PT Long Term Goals - 05/16/18 1749      PT LONG TERM GOAL #1   Title  independent with updated HEP (Target all LTGs 06/15/2018)    Time  8    Period  Weeks    Status  New      PT LONG TERM GOAL #2   Title  Score on DHI will decr >=18 points    Baseline  04/24/18   72    Time  8    Period  Weeks    Status  On-going      PT LONG TERM GOAL #3   Title  Gait velocity will improve to >=2.8 ft/sec to demonstrate lesser fall risk.     Time  8    Period  Weeks    Status  On-going      PT LONG TERM GOAL #4   Title  Pt will perform TUG in <=15 sec    Baseline  05/16/18  13.1 sec with SPC    Time  8    Period  Weeks    Status  Achieved            Plan - 05/23/18 1224    Clinical Impression Statement  Pt's BP elevated during session but did decrease as time allowed.  Pt thinks its elevated due to his pain in R groin/thigh/knee.  Pt states he is going to call chiropractor to see if he can be seen today.  Pt's balance continues to improve with improvement in corner balance exercises.  Continue PT per POC.    Rehab Potential  Good    Clinical Impairments Affecting Rehab Potential  dizziness present >2 yrs    PT Frequency  2x / week    PT Duration  8 weeks    PT  Treatment/Interventions  ADLs/Self Care Home Management;Canalith Repostioning;DME Instruction;Gait training;Stair training;Functional mobility training;Therapeutic activities;Therapeutic exercise;Balance training;Patient/family education;Neuromuscular re-education;Manual techniques;Vestibular;Visual/perceptual remediation/compensation    PT Next Visit Plan  walk without cane in gym with PT minguard; work on hip and step strategy; continue to challenge vestibular system--compliant surfaces, EC; Monitor BP pre- and post-aerobic exercise (do Sci-Fit with LEs only);    Consulted and Agree with Plan of Care  Patient;Family member/caregiver    Family Member Consulted  wife, [name]       Patient will benefit from skilled therapeutic intervention in order to improve the following deficits and impairments:  Abnormal gait, Cardiopulmonary status limiting activity, Decreased activity tolerance, Decreased balance, Decreased knowledge of use of DME, Decreased mobility, Decreased safety awareness, Dizziness, Impaired perceived functional ability, Impaired vision/preception, Impaired sensation, Postural dysfunction  Visit Diagnosis: Dizziness and giddiness  Other symptoms and signs involving the nervous system  Other abnormalities of gait and  mobility     Problem List Patient Active Problem List   Diagnosis Date Noted  . History of lacunar cerebrovascular accident (CVA) 02/12/2018  . Dizziness 02/12/2018  . Myofascial pain 12/18/2017  . Spondylosis without myelopathy or radiculopathy, lumbar region 12/18/2017  . Lumbar radiculopathy, right 12/18/2017  . Cholelithiasis 05/17/2017  . Sinus Bradycardia 05/17/2017  . Thoracic radiculopathy 04/12/2017  . Primary osteoarthritis of right knee 12/14/2016  . DDD (degenerative disc disease), lumbar 10/10/2016  . Lumbar facet arthropathy 08/29/2016  . Idiopathic scoliosis 03/22/2016  . Diabetic neuropathy ([place]) 03/22/2016  . Dysautonomia orthostatic  hypotension syndrome ([place]) 02/25/2016  . CKD (chronic kidney disease), stage III ([place]) 02/19/2016  . Coronary artery disease involving coronary bypass graft of native heart without angina pectoris   . Diabetes mellitus type 2, diet-controlled ([employer])   . 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 PVD (peripheral vascular disease) ([place]) 01/01/2014  . Mixed hyperlipidemia 04/11/2013  . Supine hypertension   . Vitamin D deficiency   . Vitamin B 12 deficiency   . Coronary atherosclerosis- s/p PCI to LAD in 2009 and PCI to RCA in 2011 02/27/2009  . Abdominal aortic aneurysm ([place]) 02/27/2009  . GERD 02/27/2009  . History of IBS 02/27/2009    Narda Bonds, PTA [facility] 05/23/18 12:27 PM Phone: [phone] Fax: [facility] [address] [place], [place], [address] Phone: [phone]   Fax:  [phone]  Name: KEVONTAY BURKS MRN: [id number] Date of Birth: October 06, 1933

## 2018-05-23 NOTE — Progress Notes (Signed)
MEDICARE ANNUAL WELLNESS VISIT AND FOLLOW UP Assessment:   [name] was seen today for follow-up and medicare wellness.  Diagnoses and all orders for this visit:  Diabetes mellitus type 2, diet-controlled ([place]) Has been controlled in prediabetic range with lifstyle Continue diet and exercise.  Perform daily foot/skin check, notify office of any concerning changes.  -     Hemoglobin A1c  CKD (chronic kidney disease), stage III (HCC) Increase fluids, avoid NSAIDS, monitor sugars, will monitor -     CMP/GFR  Vitamin D deficiency Continue supplementation, monitor routinely -     VITAMIN D 25 Hydroxy (Vit-D Deficiency, Fractures)  Mixed hyperlipidemia On repatha via cardiology, statin intolerant Continue low cholesterol diet and exercise.  Check lipid panel.  -     Lipid panel -     TSH  Medication management -     CBC with Differential/Platelet -     CMP/GFR -     Magnesium   Hx of Lacunar CVA Continue ASA, plavix Control blood pressure, cholesterol, glucose, increase exercise.  Follow up neurology as recommended  Dysautonomia orthostatic hypotension syndrome (HCC) Well controlled with current medications at home - florinef Monitor blood pressure at home - standing BPs only; patient to call if consistently greater than 130/80 or with very low readings below 90/60 Reminder to go to the ER if any CP, SOB, nausea, dizziness, severe HA, changes vision/speech, left arm numbness and tingling and jaw pain.  Gastroesophageal reflux disease, esophagitis presence not specified Well managed on current medications Discussed diet, avoiding triggers and other lifestyle changes  Diabetic polyneuropathy associated with other specified diabetes mellitus ([place]) Continue close monitoring of blood glucose, patient checks skin/feet daily, plan in place for constipation  CKD 3 Increase fluids, avoid NSAIDS, monitor sugars, will monitor - CMP/GFR  Abdominal aortic aneurysm (AAA) without  rupture ([place]) Followed by cardiology and vascular, AAA screening US annually   Coronary artery disease involving coronary bypass graft of native heart without angina pectoris Continue follow up with cardiology Continue close monitoring of blood pressure, cholesterol, blood glucose - continue ASA  PVD (peripheral vascular disease) ([place]) Continue follow up with cardiology Continue close monitoring of blood pressure, cholesterol, blood glucose - continue ASA  Other idiopathic scoliosis, lumbar region Continue f/u with pain management  Vitamin B 12 deficiency On supplementation; monitor B12 levels routinely at CPE Check CBC  S/P CABG x 4 Continue follow up cardiology  DDD (degenerative disc disease), lumbar Continue follow up spine/pain management  Acute R lumbar radiculopathy Will try steroid taper, cannot tolerate muscle relaxers Follow up with pain management, continue PT, can call back for ortho referral if not improving and can't get in with pain management  Over 30 minutes of exam, counseling, chart review, and critical decision making was performed  Future Appointments  Date Time Provider [place]  05/25/2018  9:20 AM Lelon Perla, MD [facility]  05/29/2018  7:20 AM [facility] DEVICE REMOTES [facility]  05/29/2018  9:30 AM Felts, Chassity A, PTA [facility]  06/01/2018  9:30 AM Elza Rafter, PT [facility]  06/04/2018  1:45 PM Venancio Poisson, NP [facility] None  06/05/2018  8:45 AM Quintin Alto, Jeanie Cooks, PT [facility]  06/07/2018  8:45 AM Sasser, Jeanie Cooks, PT [facility]  06/11/2018  8:45 AM Quintin Alto, Jeanie Cooks, PT [facility]  06/13/2018  8:45 AM Quintin Alto, Jeanie Cooks, PT [facility]  06/28/2018  8:45 AM Venancio Poisson, NP [facility] None  07/30/2018  9:20 AM Alger Simons  T, MD [facility] CPR  08/09/2018 10:30 AM [name], MD [facility] None  02/20/2019  2:00 PM [name], MD [facility] None     Plan:   During the  course of the visit the patient was educated and counseled about appropriate screening and preventive services including:    Pneumococcal vaccine   Influenza vaccine  Prevnar 13  Td vaccine  Screening electrocardiogram  Colorectal cancer screening  Diabetes screening  Glaucoma screening  Nutrition counseling    Subjective:  [name] is a 85 y.o. male who presents for Medicare Annual Wellness Visit and 3 month follow up for HTN, hyperlipidemia, type 2 diabetes, and vitamin D Def.   He continues to follow up with pain management and chiropractor for ongoing back pain; he is having new radicular pain of R thigh anteriorly and posteriorly x 7 days. No fall or injury recently; he has complex chronic back and thoracic radicular pain history, followed by Dr. Tessa Lerner, will try to get in to be seen. He has tried ice application which does help, denies new back pain.   Last year he was having disabling dizziness and MRI in 02/23/2018 found a small acute Rt Temporal stem CVA and he was started on Plavix w/his LD bASA. Patient had consultation by Dr Stanford Breed and had ECHO and Holter monitoring. Dr Jaynee Eagles and she recommended continuing Plavix, & strict control of  blood sugar and Lipids.   BMI is Body mass index is 25.09 kg/m., he has been working on diet and exercise, has been cutting portions,  Wt Readings from Last 3 Encounters:  05/24/18 185 lb (83.9 kg)  05/07/18 198 lb 6.4 oz (90 kg)  04/30/18 190 lb (86.2 kg)   Patient has long hx/o labile  HTN predates since 1995. In 2005 and 2011, he underwent PCA/Stents and then CABG in 2017 and since then his HTN has evolved into orthostatic Hypotension with supine Hypertension.  His wife closely monitors his standing BP's & accordingly regulates his dosing of Florinef.  His blood pressure has been controlled at home (130s/70s), today their BP is BP: (!) 142/88 He does workout. He denies chest pain, shortness of breath, dizziness.   He  is not on cholesterol medication due to age and SE. His cholesterol is not at goal. The cholesterol last visit was:   Lab Results  Component Value Date   CHOL 160 01/30/2018   HDL 32 (L) 01/30/2018   LDLCALC 98 01/30/2018   TRIG 199 (H) 01/30/2018   CHOLHDL 5.0 (H) 01/30/2018   He has been working on diet and exercise for diet-controlled T2 diabetes, he is on ASA, ARB, on repatha instead of statin via cardiology and denies increased appetite, nausea, polydipsia, polyuria, visual disturbances and vomiting. Last A1C in the office was:  Lab Results  Component Value Date   HGBA1C 6.2 (H) 01/30/2018   Last GFR Lab Results  Component Value Date   GFRNONAA 53 (L) 04/23/2018    Patient is on Vitamin D supplement and above goal:    Lab Results  Component Value Date   VD25OH 91 01/30/2018      Medication Review: Current Outpatient Medications on File Prior to Visit  Medication Sig Dispense Refill  . fludrocortisone (FLORINEF) 0.1 MG tablet Take 0.1 mg by mouth 2 (two) times daily.    Marland Kitchen gabapentin (NEURONTIN) 100 MG capsule TAKE 2 CAPSULE(200 MG) BY MOUTH 2 TIMES A DAY. (Patient taking differently: Take 200 mg by mouth 2 (two) times daily. TAKE  2 CAPSULE(200 MG) BY MOUTH 2 TIMES A DAY.) 360 capsule 3  . glucose blood test strip Check blood sugar 1 time daily-DX-E11.9 100 each 5  . Multiple Vitamin (MULTIVITAMIN WITH MINERALS) TABS tablet Take 1 tablet by mouth daily.    Marland Kitchen olmesartan (BENICAR) 40 MG tablet Take 1/2 to 1 tablet daily for BP 30 tablet 2  . polyethylene glycol (MIRALAX / GLYCOLAX) packet Take 17 g by mouth daily. (Patient taking differently: Take 17 g by mouth 2 (two) times daily. ) 28 each 1  . acetaminophen (TYLENOL) 500 MG tablet Take 1,000 mg by mouth as needed for mild pain.    Marland Kitchen aspirin 81 MG EC tablet TAKE 1 TABLET BY MOUTH EVERY DAY 31 tablet 0  . Blood Glucose Monitoring Suppl (ONE TOUCH ULTRA 2) w/Device KIT Check blood sugar 1 time daily-DX-E11.9. 1 each 0  .  butalbital-acetaminophen-caffeine (FIORICET, ESGIC) 50-325-40 MG tablet Take 1 tablet every 4 hours ONLY if needed for severe Headache 30 tablet 0  . CALCIUM PO Take 1 tablet by mouth daily.    . Cholecalciferol (VITAMIN D-3) 5000 units TABS Take 5,000 Units by mouth daily.    . clopidogrel (PLAVIX) 75 MG tablet Take 1 tablet (75 mg total) by mouth daily. (Patient taking differently: Take 75 mg by mouth at bedtime. ) 30 tablet 11  . diclofenac sodium (VOLTAREN) 1 % GEL APPLY 2 GRAMS TOPICALLY TO AFFECTED AREA 4 TIMES DAILY 300 g 4  . Evolocumab (REPATHA) 140 MG/ML SOSY Inject 1 Syringe into the skin every 14 (fourteen) days. 2 Syringe 3   No current facility-administered medications on file prior to visit.     Allergies: Allergies  Allergen Reactions  . Cymbalta [Duloxetine Hcl]     Dizziness  . Keflex [Cephalexin] Other (See Comments)    Reaction: unknown   . Simvastatin Other (See Comments)    Reaction: unknown   . Sudafed [Pseudoephedrine]     Dizziness    Current Problems (verified) has Coronary atherosclerosis- s/p PCI to LAD in 2009 and PCI to RCA in 2011; Abdominal aortic aneurysm ([place]); GERD; Supine hypertension; Vitamin D deficiency; Vitamin B 12 deficiency; Hyperlipidemia associated with type 2 diabetes mellitus ([place]); PVD (peripheral vascular disease) ([place]); S/P CABG x 4; Intercostal neuralgia; Coronary artery disease involving coronary bypass graft of native heart without angina pectoris; Diabetes mellitus type 2, diet-controlled ([place]); CKD stage 3 due to type 2 diabetes mellitus ([place]); Dysautonomia orthostatic hypotension syndrome ([place]); Idiopathic scoliosis; Diabetic neuropathy ([place]); Lumbar facet arthropathy; DDD (degenerative disc disease), lumbar; Primary osteoarthritis of right knee; Thoracic radiculopathy; Cholelithiasis; Sinus Bradycardia; Myofascial pain; Spondylosis without myelopathy or radiculopathy, lumbar region; Lumbar radiculopathy, right; History of lacunar  cerebrovascular accident (CVA); and Dizziness on their problem list.  Screening Tests Immunization History  Administered Date(s) Administered  . Influenza, High Dose Seasonal PF 12/30/2013, 02/02/2015, 01/27/2016  . Influenza-Unspecified 01/13/2013, 01/29/2017, 01/30/2018  . Pneumococcal Conjugate-13 02/14/2014  . Pneumococcal-Unspecified 06/15/2004  . Tetanus 10/01/2012  . Zoster 10/01/2012   Preventative care: Last colonoscopy: 2012 CTA head 04/2018 AAA [place] 01/2018 - followed annually by Dr. Stanford Breed  Prior vaccinations: TD or Tdap: 2014         Influenza: 2019           Pneumococcal: 2006 Prevnar13: 2015 Shingles/Zostavax: 2014  Last eye exam: Lupita Raider care - 2019 Last dentist exam: Remote - nearly full partials  Patient Care Team: [name], MD as PCP - General Rosalyn Gess Doreene Burke, PA-C as Physician  Assistant (Cardiology) Stanford Breed Denice Bors, MD as Consulting Physician (Cardiology) Irene Shipper, MD as Consulting Physician (Gastroenterology) Leighton Ruff, MD as Consulting Physician (General Surgery)  Surgical: He  has a past surgical history that includes Knee surgery; Bladder surgery (1969); Coronary angioplasty with stent; Cardiac catheterization (N/A, 07/01/2015); Coronary artery bypass graft (N/A, 07/06/2015); TEE without cardioversion (N/A, 07/06/2015); Colon resection (N/A, 05/17/2017); and LOOP RECORDER INSERTION (N/A, 02/19/2018). Family His family history includes Anuerysm in his brother; Colon cancer in his sister; Colon polyps in his sister; Diabetes in his maternal grandmother; Heart attack in his brother, father, and sister; Liver cancer in his sister. Social history  He reports that he quit smoking about 54 years ago. He quit smokeless tobacco use about 31 years ago.  His smokeless tobacco use included chew. He reports that he does not drink alcohol or use drugs.  MEDICARE WELLNESS OBJECTIVES: Physical activity: Current Exercise Habits: Home exercise  routine(PT), Type of exercise: strength training/weights, Time (Minutes): 45, Frequency (Times/Week): 4, Weekly Exercise (Minutes/Week): 180, Intensity: Mild, Exercise limited by: orthopedic condition(s);neurologic condition(s) Cardiac risk factors: Cardiac Risk Factors include: advanced age (>35mn, >>78women);hypertension;male gender;dyslipidemia;diabetes mellitus;family history of premature cardiovascular disease Depression/mood screen:   [facility]  Decreased Interest 0  Down, Depressed, Hopeless 1  PHQ - 2 Score 1  Altered sleeping -  Tired, decreased energy -  Change in appetite -  Feeling bad or failure about yourself  -  Trouble concentrating -  Moving slowly or fidgety/restless -  Suicidal thoughts -  PHQ-9 Score -  Difficult doing work/chores -  Some recent data might be hidden    ADLs:  In your present state of health, do you have any difficulty performing the following activities: 05/24/2018 05/06/2018  Hearing? N N  Vision? N N  Difficulty concentrating or making decisions? Y N  Comment can't find words, forgetful since stroke -  Walking or climbing stairs? N Y  Comment can make it with cane and handrail, uses walker in house  uses a walker due to poor balance and unstable BP  Dressing or bathing? N N  Doing errands, shopping? N Y  Comment driven by wife since stroke wife transports  PConservation officer, natureand eating ? N -  Using the Toilet? N -  In the past six months, have you accidently leaked urine? N -  Do you have problems with loss of bowel control? N -  Managing your Medications? N -  Managing your Finances? N -  Housekeeping or managing your Housekeeping? N -  Some recent data might be hidden     Cognitive Testing  Alert? Yes  Normal Appearance?Yes  Oriented to person? Yes  Place? Yes   Time? Yes  Recall of three objects?  Yes  Can perform simple calculations? Yes  Displays appropriate judgment?Yes  Can read the correct time from a  watch face?Yes  EOL planning: Does Patient Have a Medical Advance Directive?: Yes Type of Advance Directive: Healthcare Power of Attorney, Living will Does patient want to make changes to medical advance directive?: No - Patient declined Copy of HHustlerin Chart?: No - copy requested   Objective:   Today's Vitals   05/24/18 1457 05/24/18 1606  BP: (!) 190/108 (!) 142/88  Pulse: 82   Temp: 97.9 F (36.6 C)   SpO2: 95%   Weight: 185 lb (83.9 kg)   Height: 6' (1.829 m)   PainSc: 6    PainLoc: Buttocks  Body mass index is 25.09 kg/m.  General appearance: alert, no distress, WD/WN, male HEENT: normocephalic, sclerae anicteric, TMs pearly, nares patent, no discharge or erythema, pharynx normal Oral cavity: MMM, no lesions Neck: supple, no lymphadenopathy, no thyromegaly, no masses Heart: RRR, normal S1, S2, no murmurs Lungs: CTA bilaterally, no wheezes, rhonchi, or rales Abdomen: +bs, soft, non tender, non distended, no masses, no hepatomegaly, no splenomegaly Musculoskeletal: nontender, no swelling, no obvious deformity Extremities: no edema, no cyanosis, no clubbing Pulses: 1+ symmetric, upper and lower extremities, normal cap refill Neurological: alert, oriented x 3, CN2-12 intact, strength normal upper extremities, R quads with 4/5, otherwise symmetrical, sensation significantly decreased to monofilament testing bilaterally senses 2-3/10 points, DTRs 2+ throughout, no cerebellar signs, gait slow, non-antalgic with cane Psychiatric: normal affect, behavior normal, pleasant   Medicare Attestation I have personally reviewed: The patient's medical and social history Their use of alcohol, tobacco or illicit drugs Their current medications and supplements The patient's functional ability including ADLs,fall risks, home safety risks, cognitive, and hearing and visual impairment Diet and physical activities Evidence for depression or mood disorders  The  patient's weight, height, BMI, and visual acuity have been recorded in the chart.  I have made referrals, counseling, and provided education to the patient based on review of the above and I have provided the patient with a written personalized care plan for preventive services.     Izora Ribas, NP   05/24/2018

## 2018-05-24 ENCOUNTER — Encounter: Payer: Self-pay | Admitting: Adult Health

## 2018-05-24 ENCOUNTER — Ambulatory Visit: Payer: Medicare Other | Admitting: Adult Health

## 2018-05-24 VITALS — BP 142/88 | HR 82 | Temp 97.9°F | Ht 72.0 in | Wt 185.0 lb

## 2018-05-24 DIAGNOSIS — E538 Deficiency of other specified B group vitamins: Secondary | ICD-10-CM

## 2018-05-24 DIAGNOSIS — I25810 Atherosclerosis of coronary artery bypass graft(s) without angina pectoris: Secondary | ICD-10-CM

## 2018-05-24 DIAGNOSIS — I739 Peripheral vascular disease, unspecified: Secondary | ICD-10-CM

## 2018-05-24 DIAGNOSIS — E119 Type 2 diabetes mellitus without complications: Secondary | ICD-10-CM | POA: Diagnosis not present

## 2018-05-24 DIAGNOSIS — Z0000 Encounter for general adult medical examination without abnormal findings: Secondary | ICD-10-CM

## 2018-05-24 DIAGNOSIS — I10 Essential (primary) hypertension: Secondary | ICD-10-CM | POA: Diagnosis not present

## 2018-05-24 DIAGNOSIS — E1122 Type 2 diabetes mellitus with diabetic chronic kidney disease: Secondary | ICD-10-CM

## 2018-05-24 DIAGNOSIS — I714 Abdominal aortic aneurysm, without rupture, unspecified: Secondary | ICD-10-CM

## 2018-05-24 DIAGNOSIS — M51369 Other intervertebral disc degeneration, lumbar region without mention of lumbar back pain or lower extremity pain: Secondary | ICD-10-CM

## 2018-05-24 DIAGNOSIS — E1342 Other specified diabetes mellitus with diabetic polyneuropathy: Secondary | ICD-10-CM

## 2018-05-24 DIAGNOSIS — N183 Chronic kidney disease, stage 3 unspecified: Secondary | ICD-10-CM

## 2018-05-24 DIAGNOSIS — E785 Hyperlipidemia, unspecified: Secondary | ICD-10-CM | POA: Diagnosis not present

## 2018-05-24 DIAGNOSIS — E1169 Type 2 diabetes mellitus with other specified complication: Secondary | ICD-10-CM

## 2018-05-24 DIAGNOSIS — R001 Bradycardia, unspecified: Secondary | ICD-10-CM

## 2018-05-24 DIAGNOSIS — K219 Gastro-esophageal reflux disease without esophagitis: Secondary | ICD-10-CM | POA: Diagnosis not present

## 2018-05-24 DIAGNOSIS — M5416 Radiculopathy, lumbar region: Secondary | ICD-10-CM

## 2018-05-24 DIAGNOSIS — Z0001 Encounter for general adult medical examination with abnormal findings: Secondary | ICD-10-CM

## 2018-05-24 DIAGNOSIS — E559 Vitamin D deficiency, unspecified: Secondary | ICD-10-CM

## 2018-05-24 DIAGNOSIS — K8020 Calculus of gallbladder without cholecystitis without obstruction: Secondary | ICD-10-CM

## 2018-05-24 DIAGNOSIS — R6889 Other general symptoms and signs: Secondary | ICD-10-CM

## 2018-05-24 DIAGNOSIS — Z8673 Personal history of transient ischemic attack (TIA), and cerebral infarction without residual deficits: Secondary | ICD-10-CM

## 2018-05-24 DIAGNOSIS — G903 Multi-system degeneration of the autonomic nervous system: Secondary | ICD-10-CM

## 2018-05-24 DIAGNOSIS — I951 Orthostatic hypotension: Secondary | ICD-10-CM

## 2018-05-24 DIAGNOSIS — Z951 Presence of aortocoronary bypass graft: Secondary | ICD-10-CM

## 2018-05-24 DIAGNOSIS — Z6826 Body mass index (BMI) 26.0-26.9, adult: Secondary | ICD-10-CM

## 2018-05-24 DIAGNOSIS — I25110 Atherosclerotic heart disease of native coronary artery with unstable angina pectoris: Secondary | ICD-10-CM

## 2018-05-24 DIAGNOSIS — M5136 Other intervertebral disc degeneration, lumbar region: Secondary | ICD-10-CM

## 2018-05-24 MED ORDER — PREDNISONE 20 MG PO TABS: ORAL_TABLET | ORAL | 0 refills | Status: AC

## 2018-05-24 NOTE — Patient Instructions (Addendum)
Try benadryl 25-50 mg at night to help with sleep    [name] , Thank you for taking time to come for your Medicare Wellness Visit. I appreciate your ongoing commitment to your health goals. Please review the following plan we discussed and let me know if I can assist you in the future.   These are the goals we discussed: Goals    . DIET - INCREASE WATER INTAKE     Work up to 45+ fluid ounces        This is a list of the screening recommended for you and due dates:  Health Maintenance  Topic Date Due  . Hemoglobin A1C  08/01/2018  . Eye exam for diabetics  01/19/2019  . Complete foot exam   05/25/2019  . Tetanus Vaccine  10/02/2022  . Flu Shot  Completed  . Pneumonia vaccines  Completed      Lumbosacral Radiculopathy Lumbosacral radiculopathy is a condition that involves the spinal nerves and nerve roots in the low back and bottom of the spine. The condition develops when these nerves and nerve roots move out of place or become inflamed and cause symptoms. What are the causes? This condition may be caused by:  Pressure from a disk that bulges out of place (herniated disk). A disk is a plate of soft cartilage that separates bones in the spine.  Disk changes that occur with age (disk degeneration).  A narrowing of the bones of the lower back (spinal stenosis).  A tumor.  An infection.  An injury that places sudden pressure on the disks that cushion the bones of your lower spine. What increases the risk? You are more likely to develop this condition if:  You are a male who is 73-60 years old.  You are a male who is 55-25 years old.  You use improper technique when lifting things.  You are overweight or live a sedentary lifestyle.  Your work requires frequent lifting.  You smoke.  You do repetitive activities that strain the spine. What are the signs or symptoms? Symptoms of this condition include:  Pain that goes down from your back into your legs  (sciatica), usually on one side of the body. This is the most common symptom. The pain may be worse with sitting, coughing, or sneezing.  Pain and numbness in your legs.  Muscle weakness.  Tingling.  Loss of bladder control or bowel control. How is this diagnosed? This condition may be diagnosed based on:  Your symptoms and medical history.  A physical exam. If the pain is lasting, you may have tests, such as:  MRI scan.  X-ray.  CT scan.  A type of X-ray used to examine the spinal canal after injecting a dye into your spine (myelogram).  A test to measure how electrical impulses move through a nerve (nerve conduction study). How is this treated? Treatment may depend on the cause of the condition and may include:  Working with a physical therapist.  Taking pain medicine.  Applying heat and ice to affected areas.  Doing stretches to improve flexibility.  Doing exercises to strengthen back muscles.  Having chiropractic spinal manipulation.  Using transcutaneous electrical nerve stimulation (TENS) therapy.  Getting a steroid injection in the spine. In some cases, no treatment is needed. If the condition is long-lasting (chronic), or if symptoms are severe, treatment may involve surgery or lifestyle changes, such as following a weight-loss plan. Follow these instructions at home: Activity  Avoid bending and other activities  that make the problem worse.  Maintain a proper position when standing or sitting: ? When standing, keep your upper back and neck straight, with your shoulders pulled back. Avoid slouching. ? When sitting, keep your back straight and relax your shoulders. Do not round your shoulders or pull them backward.  Do not sit or stand in one place for long periods of time.  Take brief periods of rest throughout the day. This will reduce your pain. It is usually better to rest by lying down or standing, not sitting.  When you are resting for longer  periods, mix in some mild activity or stretching between periods of rest. This will help to prevent stiffness and pain.  Get regular exercise. Ask your health care provider what activities are safe for you. If you were shown how to do any exercises or stretches, do them as directed by your health care provider.  Do not lift anything that is heavier than 10 lb (4.5 kg) or the limit that you are told by your health care provider. Always use proper lifting technique, which includes: ? Bending your knees. ? Keeping the load close to your body. ? Avoiding twisting. Managing pain  If directed, put ice on the affected area: ? Put ice in a plastic bag. ? Place a towel between your skin and the bag. ? Leave the ice on for 20 minutes, 2-3 times a day.  If directed, apply heat to the affected area as often as told by your health care provider. Use the heat source that your health care provider recommends, such as a moist heat pack or a heating pad. ? Place a towel between your skin and the heat source. ? Leave the heat on for 20-30 minutes. ? Remove the heat if your skin turns bright red. This is especially important if you are unable to feel pain, heat, or cold. You may have a greater risk of getting burned.  Take over-the-counter and prescription medicines only as told by your health care provider. General instructions  Sleep on a firm mattress in a comfortable position. Try lying on your side with your knees slightly bent. If you lie on your back, put a pillow under your knees.  Do not drive or use heavy machinery while taking prescription pain medicine.  If your health care provider prescribed a diet or exercise program, follow it as directed.  Keep all follow-up visits as told by your health care provider. This is important. Contact a health care provider if:  Your pain does not improve over time, even when taking pain medicines. Get help right away if:  You develop severe pain.  Your  pain suddenly gets worse.  You develop increasing weakness in your legs.  You lose the ability to control your bladder or bowel.  You have difficulty walking or balancing.  You have a fever. Summary  Lumbosacral radiculopathy is a condition that occurs when the spinal nerves and nerve roots in the lower part of the spine move out of place or become inflamed and cause symptoms.  Symptoms include pain, numbness, and tingling that go down from your back into your legs (sciatica), muscle weakness, and loss of bladder control or bowel control.  If directed, apply ice or heat to the affected area as told by your health care provider.  Follow instructions about activity, rest, and proper lifting technique. This information is not intended to replace advice given to you by your health care provider. Make sure you discuss any  questions you have with your health care provider. Document Released: 04/18/2005 Document Revised: 04/06/2017 Document Reviewed: 04/06/2017 Elsevier Interactive Patient Education  2019 [employer].

## 2018-05-25 ENCOUNTER — Encounter: Payer: Self-pay | Admitting: Cardiology

## 2018-05-25 ENCOUNTER — Ambulatory Visit: Payer: Medicare Other | Admitting: Cardiology

## 2018-05-25 VITALS — BP 138/82 | HR 94 | Ht 72.0 in | Wt 188.0 lb

## 2018-05-25 DIAGNOSIS — I2510 Atherosclerotic heart disease of native coronary artery without angina pectoris: Secondary | ICD-10-CM

## 2018-05-25 DIAGNOSIS — I10 Essential (primary) hypertension: Secondary | ICD-10-CM | POA: Diagnosis not present

## 2018-05-25 DIAGNOSIS — E7800 Pure hypercholesterolemia, unspecified: Secondary | ICD-10-CM | POA: Diagnosis not present

## 2018-05-25 LAB — CBC WITH DIFFERENTIAL/PLATELET
Absolute Monocytes: 627 cells/uL (ref 200–950)
Basophils Absolute: 59 cells/uL (ref 0–200)
Basophils Relative: 0.6 %
Eosinophils Absolute: 441 cells/uL (ref 15–500)
Eosinophils Relative: 4.5 %
HCT: 39.5 % (ref 38.5–50.0)
Hemoglobin: 13.3 g/dL (ref 13.2–17.1)
Lymphs Abs: 2577 cells/uL (ref 850–3900)
MCH: 32.4 pg (ref 27.0–33.0)
MCHC: 33.7 g/dL (ref 32.0–36.0)
MCV: 96.1 fL (ref 80.0–100.0)
MPV: 9.7 fL (ref 7.5–12.5)
Monocytes Relative: 6.4 %
Neutro Abs: 6096 cells/uL (ref 1500–7800)
Neutrophils Relative %: 62.2 %
Platelets: 277 10*3/uL (ref 140–400)
RBC: 4.11 10*6/uL — ABNORMAL LOW (ref 4.20–5.80)
RDW: 13.5 % (ref 11.0–15.0)
Total Lymphocyte: 26.3 %
WBC: 9.8 10*3/uL (ref 3.8–10.8)

## 2018-05-25 LAB — LIPID PANEL
Cholesterol: 132 mg/dL (ref <200)
HDL: 39 mg/dL — ABNORMAL LOW (ref >40)
LDL Cholesterol (Calc): 70 mg/dL (calc)
Non-HDL Cholesterol (Calc): 93 mg/dL (calc) (ref <130)
Total CHOL/HDL Ratio: 3.4 (calc) (ref <5.0)
Triglycerides: 142 mg/dL (ref <150)

## 2018-05-25 LAB — COMPLETE METABOLIC PANEL WITH GFR
AG Ratio: 1.4 (calc) (ref 1.0–2.5)
ALT: 8 U/L — ABNORMAL LOW (ref 9–46)
AST: 19 U/L (ref 10–35)
Albumin: 4.2 g/dL (ref 3.6–5.1)
Alkaline phosphatase (APISO): 69 U/L (ref 40–115)
BUN/Creatinine Ratio: 16 (calc) (ref 6–22)
BUN: 21 mg/dL (ref 7–25)
CO2: 34 mmol/L — ABNORMAL HIGH (ref 20–32)
Calcium: 11 mg/dL — ABNORMAL HIGH (ref 8.6–10.3)
Chloride: 104 mmol/L (ref 98–110)
Creat: 1.33 mg/dL — ABNORMAL HIGH (ref 0.70–1.11)
GFR, Est African American: 57 mL/min/{1.73_m2} — ABNORMAL LOW (ref >=60)
GFR, Est Non African American: 49 mL/min/{1.73_m2} — ABNORMAL LOW (ref >=60)
Globulin: 3 g/dL (calc) (ref 1.9–3.7)
Glucose, Bld: 115 mg/dL — ABNORMAL HIGH (ref 65–99)
Potassium: 4.9 mmol/L (ref 3.5–5.3)
Sodium: 146 mmol/L (ref 135–146)
Total Bilirubin: 0.6 mg/dL (ref 0.2–1.2)
Total Protein: 7.2 g/dL (ref 6.1–8.1)

## 2018-05-25 LAB — MAGNESIUM: Magnesium: 2.4 mg/dL (ref 1.5–2.5)

## 2018-05-25 LAB — HEMOGLOBIN A1C
Hgb A1c MFr Bld: 6.3 % of total Hgb — ABNORMAL HIGH (ref <5.7)
Mean Plasma Glucose: 134 (calc)
eAG (mmol/L): 7.4 (calc)

## 2018-05-25 LAB — TSH: TSH: 2.19 mIU/L (ref 0.40–4.50)

## 2018-05-25 NOTE — Patient Instructions (Signed)
Medication Instructions:  NO CHANGE If you need a refill on your cardiac medications before your next appointment, please call your pharmacy.   Lab work: If you have labs (blood work) drawn today and your tests are completely normal, you will receive your results only by: Marland Kitchen MyChart Message (if you have MyChart) OR . A paper copy in the mail If you have any lab test that is abnormal or we need to change your treatment, we will call you to review the results.  Follow-Up: At [facility], you and your health needs are our priority.  As part of our continuing mission to provide you with exceptional heart care, we have created designated Provider Care Teams.  These Care Teams include your primary Cardiologist (physician) and Advanced Practice Providers (APPs -  Physician Assistants and Nurse Practitioners) who all work together to provide you with the care you need, when you need it. You will need a follow up appointment in 6 months.  Please call our office 2 months in advance to schedule this appointment.  You may see Kirk Ruths MD or one of the following Advanced Practice Providers on your designated Care Team:   Kerin Ransom, PA-C Roby Lofts, [place] . Sande Rives, PA-C  CALL IN MAY TO SCHEDULE APPOINTMENT IN [place]

## 2018-05-28 DIAGNOSIS — M9902 Segmental and somatic dysfunction of thoracic region: Secondary | ICD-10-CM | POA: Diagnosis not present

## 2018-05-28 DIAGNOSIS — M5134 Other intervertebral disc degeneration, thoracic region: Secondary | ICD-10-CM | POA: Diagnosis not present

## 2018-05-29 ENCOUNTER — Ambulatory Visit: Payer: Medicare Other

## 2018-05-29 ENCOUNTER — Ambulatory Visit (INDEPENDENT_AMBULATORY_CARE_PROVIDER_SITE_OTHER): Payer: Medicare Other

## 2018-05-29 VITALS — BP 157/94 | HR 87

## 2018-05-29 DIAGNOSIS — R42 Dizziness and giddiness: Secondary | ICD-10-CM

## 2018-05-29 DIAGNOSIS — M6281 Muscle weakness (generalized): Secondary | ICD-10-CM | POA: Diagnosis not present

## 2018-05-29 DIAGNOSIS — I639 Cerebral infarction, unspecified: Secondary | ICD-10-CM | POA: Diagnosis not present

## 2018-05-29 DIAGNOSIS — R2689 Other abnormalities of gait and mobility: Secondary | ICD-10-CM

## 2018-05-29 DIAGNOSIS — R29818 Other symptoms and signs involving the nervous system: Secondary | ICD-10-CM

## 2018-05-29 NOTE — Therapy (Signed)
[facility] [address] [place], [place], [address] Phone: [phone]   Fax:  [phone]  Physical Therapy Treatment  Patient Details  Name: [name] MRN: [id number] Date of Birth: [birth year] Referring Provider (PT): Venancio Poisson, NP   Encounter Date: 05/29/2018  PT End of Session - 05/29/18 0936    Visit Number  12    Number of Visits  17    Date for PT Re-Evaluation  05/16/18    Authorization Type  UHC Medicare    Authorization Time Period  04/16/18 to 07/15/2018    PT Start Time  0930    PT Stop Time  1015    PT Time Calculation (min)  45 min    Equipment Utilized During Treatment  Gait belt    Activity Tolerance  Patient tolerated treatment well    Behavior During Therapy  [facility] for tasks assessed/performed       Past Medical History:  Diagnosis Date  . 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 Colon polyps   . Coronary atherosclerosis of unspecified type of vessel, native or graft   . Diabetes ([place])   . Difficult intubation   . Esophageal reflux   . High cholesterol   . History of IBS 02/27/2009  . Hypertension    pt denies, he says he has a h/o hypotension. If BP up he adjusts the Florinef  . IBS (irritable bowel syndrome)   . Orthostatic hypotension    "BP has been dropping alot when I stand up for the last month or so" (02/17/2016)  . Vitamin B 12 deficiency   . Vitamin D deficiency     Past Surgical History:  Procedure Laterality Date  . BLADDER SURGERY  1969   traumatic pelvic fractures, urethral and bladder repair  . CARDIAC CATHETERIZATION N/A 07/01/2015   Procedure: Left Heart Cath and Coronary Angiography;  Surgeon: Wellington Hampshire, MD;  Location: [facility];  Service: Cardiovascular;  Laterality: N/A;  . COLON RESECTION N/A 05/17/2017   Procedure: DIAGNOSTIC LAPAROSCOPY,;  Surgeon: Leighton Ruff, MD;  Location: [facility];  Service: General;  Laterality: N/A;  . CORONARY  ANGIOPLASTY WITH STENT PLACEMENT    . CORONARY ARTERY BYPASS GRAFT N/A 07/06/2015   Procedure: CORONARY ARTERY BYPASS GRAFTING (CABG)x 4   utilizing the left internal mammary artery and endoscopically harvested bilateral  sapheneous vein.;  Surgeon: Ivin Poot, MD;  Location: [facility];  Service: Open Heart Surgery;  Laterality: N/A;  . KNEE SURGERY    . LOOP RECORDER INSERTION N/A 02/19/2018   Procedure: LOOP RECORDER INSERTION;  Surgeon: Deboraha Sprang, MD;  Location: [facility];  Service: Cardiovascular;  Laterality: N/A;  . TEE WITHOUT CARDIOVERSION N/A 07/06/2015   Procedure: TRANSESOPHAGEAL ECHOCARDIOGRAM (TEE);  Surgeon: Ivin Poot, MD;  Location: [facility];  Service: Open Heart Surgery;  Laterality: N/A;    Vitals:   05/29/18 0941 05/29/18 0942 05/29/18 1011 05/29/18 1012  BP: (!) 174/91 (!) 194/103 (!) 202/111 (!) 157/94  Pulse: 83 69 74 87    Subjective Assessment - 05/29/18 0933    Subjective  No falls to report, pt reports dizziness so bad he can hardly stand. Pain is still there but not as prominent as last session, was prescribed meds for pain/inflammation and that has helped.     Patient is accompained by:  Family member    Pertinent History  dysautonomia orthostatic hypotension, HTN, DDD, diabetic polyneuropathy, CKD stage  3; 01/24/18 acute right temporal CVA, Chronic bil basal ganglia and cerebellar infarcts, Advanced atherosclerosis involving the posterior circulation, chronic rt chest pain s/p CABG 2017    Diagnostic tests  MRI 02/23/18 acute left splenium infarct ; MRI 01/24/18 acute right temporal CVA    Patient Stated Goals  to not feel dizzy all the time; to be able to work on his cars    Currently in Pain?  Yes    Pain Score  3     Pain Location  Groin    Pain Orientation  Right    Pain Score  5    Pain Location  Knee    Pain Orientation  Right    Pain Score  4    Pain Location  Back    Pain Orientation  Right;Lower    Pain Score  3    Pain Location  Leg     Pain Orientation  Right        [facility] Adult PT Treatment/Exercise - 05/29/18 0944      Ambulation/Gait   Ambulation/Gait  Yes    Ambulation/Gait Assistance  4: Min guard    Ambulation/Gait Assistance Details  VC's for heel strike/toe off to avoid foot slap, some instability during turns, pt reports dizziness throughout.    Ambulation Distance (Feet)  345 Feet    Assistive device  None    Gait Pattern  Step-through pattern;Decreased trunk rotation;Trunk flexed;Wide base of support;Decreased stride length;Decreased dorsiflexion - right;Decreased dorsiflexion - left    Ambulation Surface  Level;Indoor    Gait Comments  Pt negotiating around hula hoops performing sharp turns with mod instability and VC's for proper foot placement and posture.       Neuro Re-ed    Neuro Re-ed Details   Engaged pt in ambulation in hallway while performing head turns/nods/change in pace with/without SPC, min/mod instability/deviation from path throughout task.         Engaged pt in corner balance while standing on pillows, narrow BOS, EC while performing static standing/head turns with minimal postural sway, pt able to correct.         PT Short Term Goals - 05/16/18 1010      PT SHORT TERM GOAL #1   Title  independent with initial HEP (Target for all STGs 05/16/2018)    Time  4    Period  Weeks    Status  Achieved      PT SHORT TERM GOAL #2   Title  Patient will ambulate with LRAD @ gait velocity of 2.62 ft/sec (indicative of velocity needed for safe community ambulation)    Baseline  04/16/18  2.16 ft/sec ; 05/16/18 2.68 ft/sec    Time  4    Period  Weeks    Status  Achieved      PT SHORT TERM GOAL #3   Title  Pt will improve TUG score to <=17 sec demonstrating a lesser fall risk.     Baseline  12/16  TUG 20.88 sec; 05/16/18 13.1 sec    Time  4    Period  Weeks    Status  Achieved        PT Long Term Goals - 05/16/18 1749      PT LONG TERM GOAL #1   Title  independent with updated HEP  (Target all LTGs 06/15/2018)    Time  8    Period  Weeks    Status  New      PT LONG TERM  GOAL #2   Title  Score on DHI will decr >=18 points    Baseline  04/24/18   72    Time  8    Period  Weeks    Status  On-going      PT LONG TERM GOAL #3   Title  Gait velocity will improve to >=2.8 ft/sec to demonstrate lesser fall risk.     Time  8    Period  Weeks    Status  On-going      PT LONG TERM GOAL #4   Title  Pt will perform TUG in <=15 sec    Baseline  05/16/18  13.1 sec with SPC    Time  8    Period  Weeks    Status  Achieved            Plan - 05/29/18 1141    Clinical Impression Statement  Todays skilled session focused on gait training with no AD, ambulation while scanning environment with min/mod instability, corner balance with narrow BOS on compliant surfaces and negotiating around hula hoops to decrease fall risk with turns. Upon arrival pt reports increased dizziness, also stating that is fairly normal for him. Vital check showed increased BP with sitting, decrease in standing, pt states this is not abnormal for him and has seen DR. for this issue with no answers. Pt should benefit from continued PT sessions to progress towards goals.     Rehab Potential  Good    Clinical Impairments Affecting Rehab Potential  dizziness present >2 yrs    PT Frequency  2x / week    PT Duration  8 weeks    PT Treatment/Interventions  ADLs/Self Care Home Management;Canalith Repostioning;DME Instruction;Gait training;Stair training;Functional mobility training;Therapeutic activities;Therapeutic exercise;Balance training;Patient/family education;Neuromuscular re-education;Manual techniques;Vestibular;Visual/perceptual remediation/compensation    PT Next Visit Plan  **CHECK BP PRIOR TO TX, walk without cane in gym with PT minguard; work on hip and step strategy; continue to challenge vestibular system--compliant surfaces, EC; Monitor BP pre- and post-aerobic exercise (do Sci-Fit with LEs only);     Consulted and Agree with Plan of Care  Patient;Family member/caregiver    Family Member Consulted  wife, [name]       Patient will benefit from skilled therapeutic intervention in order to improve the following deficits and impairments:  Abnormal gait, Cardiopulmonary status limiting activity, Decreased activity tolerance, Decreased balance, Decreased knowledge of use of DME, Decreased mobility, Decreased safety awareness, Dizziness, Impaired perceived functional ability, Impaired vision/preception, Impaired sensation, Postural dysfunction  Visit Diagnosis: Dizziness and giddiness  Other symptoms and signs involving the nervous system  Other abnormalities of gait and mobility     Problem List Patient Active Problem List   Diagnosis Date Noted  . History of lacunar cerebrovascular accident (CVA) 02/12/2018  . Dizziness 02/12/2018  . Myofascial pain 12/18/2017  . Spondylosis without myelopathy or radiculopathy, lumbar region 12/18/2017  . Lumbar radiculopathy, right 12/18/2017  . Cholelithiasis 05/17/2017  . Sinus Bradycardia 05/17/2017  . Thoracic radiculopathy 04/12/2017  . Primary osteoarthritis of right knee 12/14/2016  . DDD (degenerative disc disease), lumbar 10/10/2016  . Lumbar facet arthropathy 08/29/2016  . Idiopathic scoliosis 03/22/2016  . Diabetic neuropathy ([place]) 03/22/2016  . Dysautonomia orthostatic hypotension syndrome ([place]) 02/25/2016  . CKD stage 3 due to type 2 diabetes mellitus ([place]) 02/19/2016  . Coronary artery disease involving coronary bypass graft of native heart without angina pectoris   . Diabetes mellitus type 2, diet-controlled ([place])   . 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 PVD (peripheral vascular disease) ([place]) 01/01/2014  . Hyperlipidemia associated with type 2 diabetes mellitus ([place]) 04/11/2013  . Supine hypertension   . Vitamin D deficiency   . Vitamin B 12 deficiency   . Coronary atherosclerosis- s/p PCI to LAD  in 2009 and PCI to RCA in 2011 02/27/2009  . Abdominal aortic aneurysm ([place]) 02/27/2009  . GERD 02/27/2009   Aariana Shankland, PTA  Norberto Wishon A Elyshia Kumagai 05/29/2018, 11:49 AM  [facility] [address] [place], [place], [address] Phone: [phone]   Fax:  [phone]  Name: DEMETRES PROCHNOW MRN: [id number] Date of Birth: Jan 05, 1934

## 2018-05-30 NOTE — Progress Notes (Signed)
Carelink Summary Report / Loop Recorder 

## 2018-05-31 ENCOUNTER — Telehealth: Payer: Self-pay

## 2018-05-31 ENCOUNTER — Other Ambulatory Visit: Payer: Self-pay | Admitting: Adult Health

## 2018-05-31 DIAGNOSIS — M5136 Other intervertebral disc degeneration, lumbar region: Secondary | ICD-10-CM

## 2018-05-31 DIAGNOSIS — M25551 Pain in right hip: Secondary | ICD-10-CM

## 2018-05-31 NOTE — Telephone Encounter (Signed)
Patient states that he cannot get in to see Dr. Tessa Lerner, says he'll do whatever is suggested. Please put in referral to Orthopedics.

## 2018-05-31 NOTE — Telephone Encounter (Signed)
Patient is taking prednisone at a higher dose which has helped his pain. Now he's down to 1 pill and pain is coming back. Please advise

## 2018-05-31 NOTE — Telephone Encounter (Signed)
Entered in error

## 2018-06-01 ENCOUNTER — Telehealth: Payer: Self-pay

## 2018-06-01 ENCOUNTER — Ambulatory Visit: Payer: Medicare Other

## 2018-06-01 DIAGNOSIS — R42 Dizziness and giddiness: Secondary | ICD-10-CM

## 2018-06-01 LAB — CUP PACEART REMOTE DEVICE CHECK
Date Time Interrogation Session: 20200128123849
Implantable Pulse Generator Implant Date: 20191021

## 2018-06-01 NOTE — Therapy (Signed)
[facility] [address] [place], [place], [address] Phone: [phone]   Fax:  [phone]  Physical Therapy Treatment  Patient Details  Name: [name] MRN: [id number] Date of Birth: [birth year] Referring Provider (PT): Venancio Poisson, NP   Encounter Date: 06/01/2018  PT End of Session - 06/01/18 0945    Visit Number  12   no charge   Number of Visits  17    Date for PT Re-Evaluation  05/16/18    Authorization Type  UHC Medicare    Authorization Time Period  04/16/18 to 07/15/2018    PT Start Time  0931    PT Stop Time  0939    PT Time Calculation (min)  8 min       Past Medical History:  Diagnosis Date  . 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 Colon polyps   . Coronary atherosclerosis of unspecified type of vessel, native or graft   . Diabetes ([place])   . Difficult intubation   . Esophageal reflux   . High cholesterol   . History of IBS 02/27/2009  . Hypertension    pt denies, he says he has a h/o hypotension. If BP up he adjusts the Florinef  . IBS (irritable bowel syndrome)   . Orthostatic hypotension    "BP has been dropping alot when I stand up for the last month or so" (02/17/2016)  . Vitamin B 12 deficiency   . Vitamin D deficiency     Past Surgical History:  Procedure Laterality Date  . BLADDER SURGERY  1969   traumatic pelvic fractures, urethral and bladder repair  . CARDIAC CATHETERIZATION N/A 07/01/2015   Procedure: Left Heart Cath and Coronary Angiography;  Surgeon: Wellington Hampshire, MD;  Location: [facility];  Service: Cardiovascular;  Laterality: N/A;  . COLON RESECTION N/A 05/17/2017   Procedure: DIAGNOSTIC LAPAROSCOPY,;  Surgeon: Leighton Ruff, MD;  [facility];  Service: General;  Laterality: N/A;  . CORONARY ANGIOPLASTY WITH STENT PLACEMENT    . CORONARY ARTERY BYPASS GRAFT N/A 07/06/2015   Procedure: CORONARY ARTERY BYPASS GRAFTING (CABG)x 4   utilizing the left  internal mammary artery and endoscopically harvested bilateral  sapheneous vein.;  Surgeon: Ivin Poot, MD;  Location: [facility];  Service: Open Heart Surgery;  Laterality: N/A;  . KNEE SURGERY    . LOOP RECORDER INSERTION N/A 02/19/2018   Procedure: LOOP RECORDER INSERTION;  Surgeon: Deboraha Sprang, MD;  Location: [facility];  Service: Cardiovascular;  Laterality: N/A;  . TEE WITHOUT CARDIOVERSION N/A 07/06/2015   Procedure: TRANSESOPHAGEAL ECHOCARDIOGRAM (TEE);  Surgeon: Ivin Poot, MD;  Location: [facility];  Service: Open Heart Surgery;  Laterality: N/A;    There were no vitals filed for this visit.  Subjective Assessment - 06/01/18 0936    Subjective  Pt reported dizziness has not improved, pt's wife feels dizziness is from the [place], as she gave him the injection on Monday and pt has felt worse since that time. PT explained that we will hold pt until he sees neurologist and BP is mediically managed, as BP flucutation was extreme last session. PT will need parameters prior to next session.     Patient Stated Goals  to not feel dizzy all the time; to be able to work on his cars  PT Short Term Goals - 05/16/18 1010      PT SHORT TERM GOAL #1   Title  independent with initial HEP (Target for all STGs 05/16/2018)    Time  4    Period  Weeks    Status  Achieved      PT SHORT TERM GOAL #2   Title  Patient will ambulate with LRAD @ gait velocity of 2.62 ft/sec (indicative of velocity needed for safe community ambulation)    Baseline  04/16/18  2.16 ft/sec ; 05/16/18 2.68 ft/sec    Time  4    Period  Weeks    Status  Achieved      PT SHORT TERM GOAL #3   Title  Pt will improve TUG score to <=17 sec demonstrating a lesser fall risk.     Baseline  12/16  TUG 20.88 sec; 05/16/18 13.1 sec    Time  4    Period  Weeks    Status  Achieved        PT Long Term Goals - 05/16/18 1749      PT LONG TERM GOAL #1   Title   independent with updated HEP (Target all LTGs 06/15/2018)    Time  8    Period  Weeks    Status  New      PT LONG TERM GOAL #2   Title  Score on DHI will decr >=18 points    Baseline  04/24/18   72    Time  8    Period  Weeks    Status  On-going      PT LONG TERM GOAL #3   Title  Gait velocity will improve to >=2.8 ft/sec to demonstrate lesser fall risk.     Time  8    Period  Weeks    Status  On-going      PT LONG TERM GOAL #4   Title  Pt will perform TUG in <=15 sec    Baseline  05/16/18  13.1 sec with SPC    Time  8    Period  Weeks    Status  Achieved            Plan - 06/01/18 0945    Clinical Impression Statement  No charge for today. Pt reported dizziness has not improved, pt's wife feels dizziness is from the [place], as she gave him the injection on Monday and pt has felt worse since that time. PT explained that we will hold pt until he sees neurologist and BP is mediically managed, as BP flucutation was extreme last session. PT will need parameters prior to next session.     Rehab Potential  Good    Clinical Impairments Affecting Rehab Potential  dizziness present >2 yrs    PT Frequency  2x / week    PT Duration  8 weeks    PT Treatment/Interventions  ADLs/Self Care Home Management;Canalith Repostioning;DME Instruction;Gait training;Stair training;Functional mobility training;Therapeutic activities;Therapeutic exercise;Balance training;Patient/family education;Neuromuscular re-education;Manual techniques;Vestibular;Visual/perceptual remediation/compensation    PT Next Visit Plan  Need parameters for BP prior to next session. **CHECK BP PRIOR TO TX, walk without cane in gym with PT minguard; work on hip and step strategy; continue to challenge vestibular system--compliant surfaces, EC; Monitor BP pre- and post-aerobic exercise (do Sci-Fit with LEs only);    Consulted and Agree with Plan of Care  Patient;Family member/caregiver    Family Member Consulted  wife, [name]        Patient  will benefit from skilled therapeutic intervention in order to improve the following deficits and impairments:  Abnormal gait, Cardiopulmonary status limiting activity, Decreased activity tolerance, Decreased balance, Decreased knowledge of use of DME, Decreased mobility, Decreased safety awareness, Dizziness, Impaired perceived functional ability, Impaired vision/preception, Impaired sensation, Postural dysfunction  Visit Diagnosis: Dizziness and giddiness     Problem List Patient Active Problem List   Diagnosis Date Noted  . History of lacunar cerebrovascular accident (CVA) 02/12/2018  . Dizziness 02/12/2018  . Myofascial pain 12/18/2017  . Spondylosis without myelopathy or radiculopathy, lumbar region 12/18/2017  . Lumbar radiculopathy, right 12/18/2017  . Cholelithiasis 05/17/2017  . Sinus Bradycardia 05/17/2017  . Thoracic radiculopathy 04/12/2017  . Primary osteoarthritis of right knee 12/14/2016  . DDD (degenerative disc disease), lumbar 10/10/2016  . Lumbar facet arthropathy 08/29/2016  . Idiopathic scoliosis 03/22/2016  . Diabetic neuropathy ([place]) 03/22/2016  . Dysautonomia orthostatic hypotension syndrome ([place]) 02/25/2016  . CKD stage 3 due to type 2 diabetes mellitus ([place]) 02/19/2016  . Coronary artery disease involving coronary bypass graft of native heart without angina pectoris   . Diabetes mellitus type 2, diet-controlled ([place])   . 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 PVD (peripheral vascular disease) ([place]) 01/01/2014  . Hyperlipidemia associated with type 2 diabetes mellitus ([place]) 04/11/2013  . Supine hypertension   . Vitamin D deficiency   . Vitamin B 12 deficiency   . Coronary atherosclerosis- s/p PCI to LAD in 2009 and PCI to RCA in 2011 02/27/2009  . Abdominal aortic aneurysm ([place]) 02/27/2009  . GERD 02/27/2009    Aesha Agrawal L 06/01/2018, 9:48 AM  [facility] [address] [place], [place], [address] Phone: [phone]   Fax:  [phone]  Name: CONSTANTIN HILLERY MRN: [id number] Date of Birth: [birth year]  Geoffry Paradise, PT,DPT 06/01/18 9:49 AM Phone: [phone] Fax: [phone]

## 2018-06-01 NOTE — Telephone Encounter (Signed)
Dr. Wynn Maudlin  A PTA saw [name] on 05/29/18 and his BP fluctuation was extreme during the session along with c/o dizziness. I was scheduled to see him today but did not feel comfortable seeing him until his BP is medically managed based on his history of CVAs. He is scheduled to return to [facility] on 06/05/18, please send [place] BP parameters to utilize during our sessions for pt's safety.   Thank you, [name], PT,DPT 06/01/18 9:57 AM Phone: [phone] Fax: [phone]

## 2018-06-04 ENCOUNTER — Ambulatory Visit: Payer: Medicare Other | Admitting: Adult Health

## 2018-06-04 ENCOUNTER — Encounter: Payer: Self-pay | Admitting: Adult Health

## 2018-06-04 ENCOUNTER — Telehealth: Payer: Self-pay

## 2018-06-04 VITALS — BP 180/104 | HR 75 | Ht 72.0 in | Wt 166.6 lb

## 2018-06-04 DIAGNOSIS — E785 Hyperlipidemia, unspecified: Secondary | ICD-10-CM

## 2018-06-04 DIAGNOSIS — I10 Essential (primary) hypertension: Secondary | ICD-10-CM

## 2018-06-04 DIAGNOSIS — I951 Orthostatic hypotension: Secondary | ICD-10-CM

## 2018-06-04 DIAGNOSIS — Z8673 Personal history of transient ischemic attack (TIA), and cerebral infarction without residual deficits: Secondary | ICD-10-CM | POA: Diagnosis not present

## 2018-06-04 DIAGNOSIS — R42 Dizziness and giddiness: Secondary | ICD-10-CM

## 2018-06-04 NOTE — Progress Notes (Signed)
[facility]    Provider:  Dr Jaynee Eagles Referring Provider: [name], MD Primary Care Physician:  [name], MD  CC:   Chief Complaint  Patient presents with  . Follow-up    3 month follow up. Wife present. Rm 9. Patient stated that his dizziness is getting worse. Patient is currently dizzy.       HPI: 06/04/18  [name] is a 85 year old male who is being followed in this office and is accompanied by his wife.  He continues to experience consistent dizziness.  Orthostatic blood pressures obtained which show lying down 193/94 HR 69, sitting 187/100 HR 73, and standing 136/81 heart rate 86.  He continues to be followed by his PCP for management but is also followed by cardiology.  He was participating in PT but due to fluctuation of blood pressures, therapist discontinued until these can normalize due to safety concerns.  He continues to ambulate with a cane due to balance difficulties and frequent dizziness episodes.  He denies any type of edema.  When asked how much fluid intake he has throughout the day, his wife endorses that he drinks plenty but when questioned exact amount, she states approximately 20 ounce cup of water and then additional liquids with meals.  Repatha was started at prior appointment by having difficulty tolerating due to pain in joints and muscles.  He states pain worsens after injection and will start to subside towards the end of 2 weeks but then will have another injection and pain will increase.  He continues on aspirin and Plavix without side effects of bleeding or bruising.  He did have recent lipid panel obtained with LDL 70.  Loop recorder has not shown atrial fibrillation thus far.  No further concerns at this time and denies new or worsening stroke/TIA symptoms.   History summary: history 03/28/2018: Patient is being seen today for requested sooner follow-up by patient.  Patient called office on 02/09/2018 when she became  extremely weak, dizzy, shaky and with blurry vision.  he followed up with his PCP with labs obtained, orthostatic vital signs done along with vision testing.  After prior visit, MRI cervical spine was ordered which was then performed and this was recommended to be obtained along with MRI brain due to her new symptoms.  MRI brain obtained on 02/23/2018 which was reviewed and did show small acute stroke in the left splenium which was not present on MRI obtained 01/24/2018.  This MRI also reconfirmed prior right temporal lobe stroke along with chronic lacunar infarctions in the right basal ganglia, cerebellum and corona radiata.  MRI cervical spine was negative for acute abnormality.  Continues to experience dizziness which has worsened in the past 1.5 months as he does have underlying dizziness issues after bypass procedure. Consistent dizzy sensation, does worsen with standing. Denies room spinning sensation. Blurry vision also continues which has been consistent since initial stroke in September.   Glucose levels have been good with recent readings at 113 this morning. He has been trying to avoid carbs and eating healthier.  Blood pressure today 163/93 and has been working with PCP/cardiologist to obtain normal readings  2D echo obtained on 02/01/2018 which showed ejection fraction 55 to 60% and negative for PFO.  After stroke found on 01/24/2018 scans, he was started on Plavix along with continuation of aspirin 81 mg.  He has continued on both his medications and denies any side effects of bleeding or bruising. Recent lab work by PCP on 01/30/2018 with  a lipid panel showing total cholesterol 160, HDL 32, triglycerides 199 and LDL 98.  A1c 6.2.  He attempted to use Zetia for HLD management but states it caused increased dizziness compared to his dizziness prior to initiating and is worsening dizziness resolved after discontinuing Zetia.  Unfortunately, patient is intolerant to statins as he has tried  atorvastatin, pravastatin and Crestor in the past which all cause muscle aches and pains which resolved after discontinuation.  Loop recorder placed on 02/19/2018  Patient is followed by Dr. Naaman Plummer in physical medicine for his chronic pain    HPI 01/30/2018 Dr. Jaynee Eagles:  [name] is a 85 y.o. male here as requested by Dr. Melford Aase for headaches. PMHx PVD, CAD, DIabetes with complications, dysautonomia orthostatic hypotension, DDD, CKD stage 3, vitamin B12 deficiency.  He woke up with a headache 2 months ago, he has a history of "sinus headaches", wife is here and says he has had headaches for much longer than 2 months, he takes daily tylenol to help with back and pain, no ibuprofen.  Wife says frequent headaches for longer. Worsened 2 months ago. He saw the eye doctor. Esr and crp were unremarkable. Several weeks ago his balance worsened likely around the time of the stoke. His headache is behind the right ear, and in the back of the head, worse with laying down on the back of his head, throbbing, he has chronic neck pain and degenerative arthritis. No recent falls.  Reviewed notes, labs and imaging from outside physicians, which showed:   MRI brain: reviewed imaging and agree with the following 01/24/2018:  IMPRESSION: 1. Small acute infarct in the right temporal stem. 2. Moderate to severe chronic small vessel ischemic disease. 3. 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 4. Advanced atherosclerosis involving the posterior circulation including multiple severe PCA stenoses. No large vessel occlusion.  hgba1c 5.8 ldl 122 TSH nml    Review of Systems: Patient complains of symptoms per HPI as well as the following symptoms: Blurred vision, trouble swallowing, frequent waking, daytime sleepiness, joint pain, back pain, aching muscles, walking difficulty, dizziness, numbness, weakness and tremors pertinent negatives and positives per HPI. All others negative.   Social  History   Socioeconomic History  . Marital status: Married    Spouse name: Not on file  . Number of children: 2  . Years of education: Not on file  . Highest education level: High school graduate  Occupational History  . Occupation: Retired  Scientific laboratory technician  . Financial resource strain: Not on file  . Food insecurity:    Worry: Not on file    Inability: Not on file  . Transportation needs:    Medical: Not on file    Non-medical: Not on file  Tobacco Use  . Smoking status: Former Smoker    Last attempt to quit: 07/16/1963    Years since quitting: 54.9  . Smokeless tobacco: Former Systems developer    Types: Chew    Quit date: 1989  Substance and Sexual Activity  . Alcohol use: No  . Drug use: No  . Sexual activity: Not Currently  Lifestyle  . Physical activity:    Days per week: Not on file    Minutes per session: Not on file  . Stress: Not on file  Relationships  . Social connections:    Talks on phone: Not on file    Gets together: Not on file    Attends religious service: Not on file    Active member  of club or organization: Not on file    Attends meetings of clubs or organizations: Not on file    Relationship status: Not on file  . Intimate partner violence:    Fear of current or ex partner: Not on file    Emotionally abused: Not on file    Physically abused: Not on file    Forced sexual activity: Not on file  Other Topics Concern  . Not on file  Social History Narrative   Lives at home with his wife Estill Bamberg   Right handed   No caffeine    Family History  Problem Relation Age of Onset  . Heart attack Father        died age 31  . Heart attack Brother        died age 78  . Anuerysm Brother        aortic  . Heart attack Sister        died age 41  . Colon cancer Sister   . Liver cancer Sister   . Diabetes Maternal Grandmother   . Colon polyps Sister        and brothers x 2     Past Medical History:  Diagnosis Date  . 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 Colon  polyps   . Coronary atherosclerosis of unspecified type of vessel, native or graft   . Diabetes ([place])   . Difficult intubation   . Esophageal reflux   . High cholesterol   . History of IBS 02/27/2009  . Hypertension    pt denies, he says he has a h/o hypotension. If BP up he adjusts the Florinef  . IBS (irritable bowel syndrome)   . Orthostatic hypotension    "BP has been dropping alot when I stand up for the last month or so" (02/17/2016)  . Vitamin B 12 deficiency   . Vitamin D deficiency     Past Surgical History:  Procedure Laterality Date  . BLADDER SURGERY  1969   traumatic pelvic fractures, urethral and bladder repair  . CARDIAC CATHETERIZATION N/A 07/01/2015   Procedure: Left Heart Cath and Coronary Angiography;  Surgeon: Wellington Hampshire, MD;  Location: [facility];  Service: Cardiovascular;  Laterality: N/A;  . COLON RESECTION N/A 05/17/2017   Procedure: DIAGNOSTIC LAPAROSCOPY,;  Surgeon: Leighton Ruff, MD;  Location: [facility];  Service: General;  Laterality: N/A;  . CORONARY ANGIOPLASTY WITH STENT PLACEMENT    . CORONARY ARTERY BYPASS GRAFT N/A 07/06/2015   Procedure: CORONARY ARTERY BYPASS GRAFTING (CABG)x 4   utilizing the left internal mammary artery and endoscopically harvested bilateral  sapheneous vein.;  Surgeon: Ivin Poot, MD;  Location: [facility];  Service: Open Heart Surgery;  Laterality: N/A;  . KNEE SURGERY    . LOOP RECORDER INSERTION N/A 02/19/2018   Procedure: LOOP RECORDER INSERTION;  Surgeon: Deboraha Sprang, MD;  Location: [facility];  Service: Cardiovascular;  Laterality: N/A;  . TEE WITHOUT CARDIOVERSION N/A 07/06/2015   Procedure: TRANSESOPHAGEAL ECHOCARDIOGRAM (TEE);  Surgeon: Ivin Poot, MD;  Location: [facility];  Service: Open Heart Surgery;  Laterality: N/A;    Current Outpatient Medications  Medication Sig Dispense Refill  . acetaminophen (TYLENOL) 500 MG tablet Take 1,000 mg by mouth as needed for mild pain.    Marland Kitchen aspirin 81 MG EC  tablet TAKE 1 TABLET BY MOUTH EVERY DAY 31 tablet 0  . Blood Glucose Monitoring Suppl (ONE TOUCH ULTRA 2) w/Device KIT  Check blood sugar 1 time daily-DX-E11.9. 1 each 0  . butalbital-acetaminophen-caffeine (FIORICET, ESGIC) 50-325-40 MG tablet Take 1 tablet every 4 hours ONLY if needed for severe Headache 30 tablet 0  . CALCIUM PO Take 1 tablet by mouth daily.    . Cholecalciferol (VITAMIN D-3) 5000 units TABS Take 5,000 Units by mouth daily.    . clopidogrel (PLAVIX) 75 MG tablet Take 1 tablet (75 mg total) by mouth daily. (Patient taking differently: Take 75 mg by mouth at bedtime. ) 30 tablet 11  . diclofenac sodium (VOLTAREN) 1 % GEL APPLY 2 GRAMS TOPICALLY TO AFFECTED AREA 4 TIMES DAILY 300 g 4  . fludrocortisone (FLORINEF) 0.1 MG tablet Take 0.1 mg by mouth daily.     Marland Kitchen gabapentin (NEURONTIN) 100 MG capsule TAKE 2 CAPSULE(200 MG) BY MOUTH 2 TIMES A DAY. (Patient taking differently: Take 200 mg by mouth 2 (two) times daily. TAKE 2 CAPSULE(200 MG) BY MOUTH 2 TIMES A DAY.) 360 capsule 3  . glucose blood test strip Check blood sugar 1 time daily-DX-E11.9 100 each 5  . Multiple Vitamin (MULTIVITAMIN WITH MINERALS) TABS tablet Take 1 tablet by mouth daily.    Marland Kitchen olmesartan (BENICAR) 40 MG tablet Take 1/2 to 1 tablet daily for BP 30 tablet 2  . polyethylene glycol (MIRALAX / GLYCOLAX) packet Take 17 g by mouth daily. (Patient taking differently: Take 17 g by mouth 2 (two) times daily. ) 28 each 1  . predniSONE (DELTASONE) 20 MG tablet 3 tablets daily with food for 3 days, 2 tabs daily for 3 days, 1 tab a day for 5 days. 20 tablet 0   No current facility-administered medications for this visit.     Allergies as of 06/04/2018 - Review Complete 06/04/2018  Allergen Reaction Noted  . Cymbalta [duloxetine hcl]  03/18/2013  . Keflex [cephalexin] Other (See Comments) 03/18/2013  . Simvastatin Other (See Comments) 03/18/2013  . Sudafed [pseudoephedrine]  03/18/2013    Vitals: BP (!) 180/104   Pulse  75   Ht 6' (1.829 m)   Wt 166 lb 9.6 oz (75.6 kg)   BMI 22.60 kg/m  Last Weight:  Wt Readings from Last 1 Encounters:  06/04/18 166 lb 9.6 oz (75.6 kg)   Last Height:   Ht Readings from Last 1 Encounters:  06/04/18 6' (1.829 m)   Physical exam: Exam: Gen: NAD, pleasant elderly Caucasian male, conversant, well nourised, obese, well groomed                     CV:  RRR, no MRG. No Carotid Bruits. No peripheral edema, warm, nontender Eyes: Conjunctivae clear without exudates or hemorrhage MSK:  Loss of normal cervical lordosis.    Neuro: Detailed Neurologic Exam  Speech:    Speech is normal; fluent and spontaneous with normal comprehension.  Cognition:    The patient is oriented to person, place, and time;     recent and remote memory intact;     language fluent;     normal attention, concentration,     fund of knowledge Cranial Nerves:    The pupils are equal, round, and reactive to light.  Visual fields are full to finger confrontation. Extraocular movements are intact and negative for nystagmus. Trigeminal sensation is intact and the muscles of mastication are normal. The face is symmetric. The palate elevates in the midline. Hearing intact. Voice is normal. Shoulder shrug is normal. The tongue has normal motion without fasciculations.   Coordination:  No dysmetria  Gait:    Imbalance on standing, using a cane, no shuffling, not ataxic.  Romberg positive  Motor Observation:    No asymmetry, no atrophy, and no involuntary movements noted. Tone:    Normal muscle tone.    Posture:    Posture is slightly stooped    Strength:    Equal strength in all tested extremities     Sensation: decreased LE distally to all modalities,     Reflex Exam:  DTR's:    Absent lowers, hypo uppers.    Toes:    The toes are equiv bilaterally.   Clonus:    Clonus is absent.       Assessment/Plan:   85 y.o. Lovely male here as requested by Dr. Melford Aase for headaches. PMHx PVD,  CAD, DIabetes with complications, dysautonomia orthostatic hypotension, DDD, CKD stage 3, vitamin B12 deficiency.  MRI of the brain showed an acute lacunar stroke likely secondary to small vessel disease due to significant vascular risk factors, chronic advanced microvascular disease however cannot rule out embolic/afib as cause.  Repeat MRI obtained due to recurrent dizzy symptoms with visual changes and was found to have acute infarct in the left splenium on 02/23/2018.  He returns today for follow-up visit with continued dizziness and orthostatic hypotension.  -Continue aspirin and Plavix for secondary stroke prevention -Advised he can discontinue use of aspirin mid-March and continue Plavix only has no additional stroke benefit of prolonged DAPT.  Recommended 36-monthDAPT due to intracranial stenosis -Due to intolerance of statins and other cholesterol management, recommend to initiate omega-3 fatty acids with continued follow-up with PCP for HLD management -Advised to schedule appointment with PCP or cardiology in regards to continued BP fluctuations and orthostatic hypotension likely causing continued dizziness -Advised to increase fluid intake to approximately 64 ounces of water per day along with maintaining adequate sodium intake -Continue monitoring blood pressure at home -Maintain strict control of hypertension with blood pressure goal below 130/90, diabetes with hemoglobin A1c goal below 6.5% and cholesterol with LDL cholesterol (bad cholesterol) goal below 70 mg/dL. I also advised the patient to eat a healthy diet with plenty of whole grains, cereals, fruits and vegetables, exercise regularly and maintain ideal body weight.  Patient will follow-up in 6 months time  Greater than 50% of time during this 25 minute visit was spent on counseling,explanation of diagnosis of multiple infarcts, reviewing risk factor management of HTN, HLD, DM, planning of further management, discussion with patient  and family and coordination of care   JVenancio Poisson AGNP-BC  [facility] [address] CourtSRondaGJuno Beach Parkline [address] Phone [phone] [phone]: This document was prepared with digital dictation and possible smart phrase technology. Any transcriptional errors that result from this process are unintentional.

## 2018-06-04 NOTE — Telephone Encounter (Signed)
Saw Dr. Pleas Patricia this morning and she told him that he has orthostatic hyperstension. She recommended that he ask you if he should see a specialist.

## 2018-06-04 NOTE — Telephone Encounter (Signed)
Spoke with pt wife, Aware of dr crenshaw's recommendations.  

## 2018-06-04 NOTE — Telephone Encounter (Signed)
Blood pressure has been being managed by cardiology and PCP.  He is currently on Florinef to assist with orthostatic hypotension by his cardiologist.  BP fluctuation concerns especially during therapy sessions will be deferred to his cardiologist and PCP.  I will route this note to both his cardiologist and PCP.

## 2018-06-04 NOTE — Telephone Encounter (Signed)
Continue present meds and follow BP Brian Crenshaw  

## 2018-06-04 NOTE — Patient Instructions (Addendum)
Continue aspirin 81 mg daily and clopidogrel 75 mg daily  and   for secondary stroke prevention  Stop aspirin and continue plavix only after Mid March    Omega 3 fatty acids (fish oil) for cholesterol management   Continue to follow up with PCP regarding cholesterol and blood pressure management   Call your primary regarding large fluctuations of blood pressure with position change. Consider referral to see a specialist solely for orthostatic hypotension - increase fluid intake to around 64 oz of water per day  Continue to monitor blood pressure at home  Maintain strict control of hypertension with blood pressure goal below 130/90, diabetes with hemoglobin A1c goal below 6.5% and cholesterol with LDL cholesterol (bad cholesterol) goal below 70 mg/dL. I also advised the patient to eat a healthy diet with plenty of whole grains, cereals, fruits and vegetables, exercise regularly and maintain ideal body weight.  Followup in the future with me in 6 months or call earlier if needed       Thank you for coming to see [place] at [facility]. I hope we have been able to provide you high quality care today.  You may receive a patient satisfaction survey over the next few weeks. We would appreciate your feedback and comments so that we may continue to improve ourselves and the health of our patients.

## 2018-06-04 NOTE — Progress Notes (Signed)
Personally  participated in, made any corrections needed, and agree with history, physical, neuro exam,assessment and plan as stated.     Ryn Peine, MD [facility]     

## 2018-06-05 ENCOUNTER — Ambulatory Visit: Payer: Medicare Other | Attending: Adult Health | Admitting: Physical Therapy

## 2018-06-05 ENCOUNTER — Encounter: Payer: Self-pay | Admitting: Physical Therapy

## 2018-06-05 VITALS — BP 142/88 | HR 104

## 2018-06-05 DIAGNOSIS — R29818 Other symptoms and signs involving the nervous system: Secondary | ICD-10-CM

## 2018-06-05 DIAGNOSIS — M5134 Other intervertebral disc degeneration, thoracic region: Secondary | ICD-10-CM | POA: Diagnosis not present

## 2018-06-05 DIAGNOSIS — R2689 Other abnormalities of gait and mobility: Secondary | ICD-10-CM | POA: Diagnosis not present

## 2018-06-05 DIAGNOSIS — R42 Dizziness and giddiness: Secondary | ICD-10-CM | POA: Diagnosis not present

## 2018-06-05 DIAGNOSIS — M9902 Segmental and somatic dysfunction of thoracic region: Secondary | ICD-10-CM | POA: Diagnosis not present

## 2018-06-05 NOTE — Patient Instructions (Addendum)
Backward    At the kitchen counter, with your hand on the counter walk backwards with eyes open. Take even steps, making sure each foot lifts off floor. Make sure each foot passes the other.  Repeat for ___5 lengths_ Do _1-2___ sessions per day.  Access Code: [email or web address]  URL: [email or web address]  Date: 06/05/2018  Prepared by: Barry Brunner   Exercises  Standing Balance with Eyes Open - 10 reps - 1 sets - - hold - 2x daily - 7x weekly  Romberg Stance with Eyes Closed - 3 reps - 1 sets - 30 hold - 2x daily - 7x weekly

## 2018-06-05 NOTE — Therapy (Signed)
[facility] [address] [place], [place], [address] Phone: [phone]   Fax:  [phone]  Physical Therapy Treatment  Patient Details  Name: [name] MRN: [id number] Date of Birth: [birth year] Referring Provider (PT): Venancio Poisson, NP   Encounter Date: 06/05/2018  PT End of Session - 06/05/18 0851    Visit Number  13    Number of Visits  17    Date for PT Re-Evaluation  05/16/18    Authorization Type  [employer] Medicare    Authorization Time Period  04/16/18 to 07/15/2018    PT Start Time  0847    PT Stop Time  0931    PT Time Calculation (min)  44 min    Equipment Utilized During Treatment  Gait belt    Activity Tolerance  Patient tolerated treatment well    Behavior During Therapy  [facility] for tasks assessed/performed       Past Medical History:  Diagnosis Date  . 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 Colon polyps   . Coronary atherosclerosis of unspecified type of vessel, native or graft   . Diabetes ([place])   . Difficult intubation   . Esophageal reflux   . High cholesterol   . History of IBS 02/27/2009  . Hypertension    pt denies, he says he has a h/o hypotension. If BP up he adjusts the Florinef  . IBS (irritable bowel syndrome)   . Orthostatic hypotension    "BP has been dropping alot when I stand up for the last month or so" (02/17/2016)  . Vitamin B 12 deficiency   . Vitamin D deficiency     Past Surgical History:  Procedure Laterality Date  . BLADDER SURGERY  1969   traumatic pelvic fractures, urethral and bladder repair  . CARDIAC CATHETERIZATION N/A 07/01/2015   Procedure: Left Heart Cath and Coronary Angiography;  Surgeon: Wellington Hampshire, MD;  Location: [facility];  Service: Cardiovascular;  Laterality: N/A;  . COLON RESECTION N/A 05/17/2017   Procedure: DIAGNOSTIC LAPAROSCOPY,;  Surgeon: Leighton Ruff, MD;  [facility];  Service: General;  Laterality: N/A;  . CORONARY  ANGIOPLASTY WITH STENT PLACEMENT    . CORONARY ARTERY BYPASS GRAFT N/A 07/06/2015   Procedure: CORONARY ARTERY BYPASS GRAFTING (CABG)x 4   utilizing the left internal mammary artery and endoscopically harvested bilateral  sapheneous vein.;  Surgeon: Ivin Poot, MD;  Location: [facility];  Service: Open Heart Surgery;  Laterality: N/A;  . KNEE SURGERY    . LOOP RECORDER INSERTION N/A 02/19/2018   Procedure: LOOP RECORDER INSERTION;  Surgeon: Deboraha Sprang, MD;  Location: [facility];  Service: Cardiovascular;  Laterality: N/A;  . TEE WITHOUT CARDIOVERSION N/A 07/06/2015   Procedure: TRANSESOPHAGEAL ECHOCARDIOGRAM (TEE);  Surgeon: Ivin Poot, MD;  Location: [facility];  Service: Open Heart Surgery;  Laterality: N/A;    Vitals:   06/05/18 0851 06/05/18 0857 06/05/18 0900 06/05/18 0910  BP: (!) 154/102 138/90 114/72 (!) 142/88  Pulse:   (!) 104 (!) 104    Sitting     Stand   3 min standing  After walking  Subjective Assessment - 06/05/18 1229    Subjective  Patient/wife saw neurology Janett Billow, NP) and they want him referred to a specialist for his orthostasis (pt has been in contact with his PCP and cardiology office to seek referral to "specialist"--they are unsure what kind of specialist). Educated on risks of elevated BP (  CVA) and low BP (syncope/fall) and pt fully aware of risks. "I can't just sit around all the time." Reports he feels his vision has worsened again (but not as bad as it was previously). Feels his balance has improved.     Patient is accompained by:  Family member    Pertinent History  dysautonomia orthostatic hypotension, HTN, DDD, diabetic polyneuropathy, CKD stage 3; 01/24/18 acute right temporal CVA, Chronic bil basal ganglia and cerebellar infarcts, Advanced atherosclerosis involving the posterior circulation, chronic rt chest pain s/p CABG 2017    Diagnostic tests  MRI 02/23/18 acute left splenium infarct ; MRI 01/24/18 acute right temporal CVA    Patient Stated Goals   to not feel dizzy all the time; to be able to work on his cars    Currently in Pain?  Yes    Pain Score  5     Pain Location  Leg    Pain Orientation  Right    Pain Descriptors / Indicators  Aching;Sharp    Pain Type  Acute pain    Pain Radiating Towards  hip/groin, anterior thigh, knee    Pain Onset  1 to 4 weeks ago    Pain Frequency  Constant    Aggravating Factors   sleeping, walking too much    Pain Relieving Factors  biofreeze, move around                       [facility] Adult PT Treatment/Exercise - 06/05/18 0900      Ambulation/Gait   Ambulation/Gait Assistance  4: Min guard    Ambulation/Gait Assistance Details  slightly wide BOS, using cane, no overt imbalance    Ambulation Distance (Feet)  400 Feet    Assistive device  Straight cane    Gait Pattern  Step-through pattern;Decreased trunk rotation;Trunk flexed;Wide base of support;Decreased stride length;Decreased dorsiflexion - right;Decreased dorsiflexion - left      Posture/Postural Control   Posture/Postural Control  Postural limitations    Postural Limitations  Rounded Shoulders;Forward head;Increased thoracic kyphosis;Decreased lumbar lordosis;Posterior pelvic tilt    Posture Comments  vc during gait and balance activities with pt able to significantly improve      Self-Care   Other Self-Care Comments   discussed BP issues and vision issues which PT is not going to "fix" Discussed safest activity for him currently appears to be walking (BP had normal response and did not elevate too high); discussed ?role of PT moving forward and will begin to chk LTGs next visit          Balance Exercises - 06/05/18 1236      Balance Exercises: Standing   Standing Eyes Opened  Narrow base of support (BOS);Head turns;Solid surface    Standing Eyes Closed  Narrow base of support (BOS);Wide (BOA);Head turns;Solid surface    Retro Gait  Upper extremity support;4 reps   at counter-educ hand on counter for safety at home          PT Short Term Goals - 05/16/18 1010      PT SHORT TERM GOAL #1   Title  independent with initial HEP (Target for all STGs 05/16/2018)    Time  4    Period  Weeks    Status  Achieved      PT SHORT TERM GOAL #2   Title  Patient will ambulate with LRAD @ gait velocity of 2.62 ft/sec (indicative of velocity needed for safe community ambulation)    Baseline  04/16/18  2.16 ft/sec ; 05/16/18 2.68 ft/sec    Time  4    Period  Weeks    Status  Achieved      PT SHORT TERM GOAL #3   Title  Pt will improve TUG score to <=17 sec demonstrating a lesser fall risk.     Baseline  12/16  TUG 20.88 sec; 05/16/18 13.1 sec    Time  4    Period  Weeks    Status  Achieved        PT Long Term Goals - 05/16/18 1749      PT LONG TERM GOAL #1   Title  independent with updated HEP (Target all LTGs 06/15/2018)    Time  8    Period  Weeks    Status  New      PT LONG TERM GOAL #2   Title  Score on DHI will decr >=18 points    Baseline  04/24/18   72    Time  8    Period  Weeks    Status  On-going      PT LONG TERM GOAL #3   Title  Gait velocity will improve to >=2.8 ft/sec to demonstrate lesser fall risk.     Time  8    Period  Weeks    Status  On-going      PT LONG TERM GOAL #4   Title  Pt will perform TUG in <=15 sec    Baseline  05/16/18  13.1 sec with SPC    Time  8    Period  Weeks    Status  Achieved            Plan - 06/05/18 1240    Clinical Impression Statement  Patient's dysautonomia orthostatic hypotension continues to cause large swings in his BP (sitting to standing; and during seated aerobic activity). Today utilized walking for aerobic exercise with appropriate BP response, however his RLE began to hurt him more with walking (from 5 to 7/10). Remainder of session focused on balance (which has improved) and education related to BP and safe exercise. Patient agrees to begin checking LTGs next visit to prepare for discharge from PT (although pt hopes to extend his  PT episode of care).     Rehab Potential  Good    Clinical Impairments Affecting Rehab Potential  dizziness present >2 yrs    PT Frequency  2x / week    PT Duration  8 weeks    PT Treatment/Interventions  ADLs/Self Care Home Management;Canalith Repostioning;DME Instruction;Gait training;Stair training;Functional mobility training;Therapeutic activities;Therapeutic exercise;Balance training;Patient/family education;Neuromuscular re-education;Manual techniques;Vestibular;Visual/perceptual remediation/compensation    PT Next Visit Plan  **CHECK BP PRIOR TO TX (sit and stand); begin to check LTGs and prepare for discharge; what did orthopedist say?; work on hip and step strategy; continue to challenge vestibular system--compliant surfaces, EC; Monitor BP pre- and post-aerobic exercise (do Sci-Fit with LEs only);    Consulted and Agree with Plan of Care  Patient;Family member/caregiver    Family Member Consulted  wife, [name]       Patient will benefit from skilled therapeutic intervention in order to improve the following deficits and impairments:  Abnormal gait, Cardiopulmonary status limiting activity, Decreased activity tolerance, Decreased balance, Decreased knowledge of use of DME, Decreased mobility, Decreased safety awareness, Dizziness, Impaired perceived functional ability, Impaired vision/preception, Impaired sensation, Postural dysfunction  Visit Diagnosis: Dizziness and giddiness  Other symptoms and signs involving the nervous system  Other abnormalities of  gait and mobility     Problem List Patient Active Problem List   Diagnosis Date Noted  . History of lacunar cerebrovascular accident (CVA) 02/12/2018  . Dizziness 02/12/2018  . Myofascial pain 12/18/2017  . Spondylosis without myelopathy or radiculopathy, lumbar region 12/18/2017  . Lumbar radiculopathy, right 12/18/2017  . Cholelithiasis 05/17/2017  . Sinus Bradycardia 05/17/2017  . Thoracic radiculopathy 04/12/2017  .  Primary osteoarthritis of right knee 12/14/2016  . DDD (degenerative disc disease), lumbar 10/10/2016  . Lumbar facet arthropathy 08/29/2016  . Idiopathic scoliosis 03/22/2016  . Diabetic neuropathy ([place]) 03/22/2016  . Dysautonomia orthostatic hypotension syndrome ([place]) 02/25/2016  . CKD stage 3 due to type 2 diabetes mellitus ([place]) 02/19/2016  . Coronary artery disease involving coronary bypass graft of native heart without angina pectoris   . Diabetes mellitus type 2, diet-controlled ([place])   . 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 PVD (peripheral vascular disease) ([place]) 01/01/2014  . Hyperlipidemia associated with type 2 diabetes mellitus ([place]) 04/11/2013  . Supine hypertension   . Vitamin D deficiency   . Vitamin B 12 deficiency   . Coronary atherosclerosis- s/p PCI to LAD in 2009 and PCI to RCA in 2011 02/27/2009  . Abdominal aortic aneurysm ([place]) 02/27/2009  . GERD 02/27/2009    Rexanne Mano, PT 06/05/2018, 12:48 PM  [facility] [address] [place], [place], [address] Phone: [phone]   Fax:  [phone]  Name: [name] MRN: [id number] Date of Birth: [birth year]

## 2018-06-07 ENCOUNTER — Encounter: Payer: Self-pay | Admitting: Physical Therapy

## 2018-06-07 ENCOUNTER — Ambulatory Visit (INDEPENDENT_AMBULATORY_CARE_PROVIDER_SITE_OTHER): Payer: Medicare Other | Admitting: Physician Assistant

## 2018-06-07 ENCOUNTER — Ambulatory Visit: Payer: Medicare Other | Admitting: Physical Therapy

## 2018-06-07 ENCOUNTER — Ambulatory Visit (INDEPENDENT_AMBULATORY_CARE_PROVIDER_SITE_OTHER): Payer: Self-pay

## 2018-06-07 ENCOUNTER — Ambulatory Visit (INDEPENDENT_AMBULATORY_CARE_PROVIDER_SITE_OTHER): Payer: Medicare Other

## 2018-06-07 ENCOUNTER — Encounter (INDEPENDENT_AMBULATORY_CARE_PROVIDER_SITE_OTHER): Payer: Self-pay | Admitting: Physician Assistant

## 2018-06-07 VITALS — BP 142/98

## 2018-06-07 VITALS — Ht 72.0 in | Wt 166.6 lb

## 2018-06-07 DIAGNOSIS — M7061 Trochanteric bursitis, right hip: Secondary | ICD-10-CM | POA: Diagnosis not present

## 2018-06-07 DIAGNOSIS — M1711 Unilateral primary osteoarthritis, right knee: Secondary | ICD-10-CM

## 2018-06-07 DIAGNOSIS — R2689 Other abnormalities of gait and mobility: Secondary | ICD-10-CM | POA: Diagnosis not present

## 2018-06-07 DIAGNOSIS — R29818 Other symptoms and signs involving the nervous system: Secondary | ICD-10-CM

## 2018-06-07 DIAGNOSIS — R42 Dizziness and giddiness: Secondary | ICD-10-CM

## 2018-06-07 MED ORDER — LIDOCAINE HCL 1 % IJ SOLN
3.0000 mL | INTRAMUSCULAR | Status: AC | PRN
  Administered 2018-06-07: 3 mL

## 2018-06-07 MED ORDER — METHYLPREDNISOLONE ACETATE 40 MG/ML IJ SUSP
40.0000 mg | INTRAMUSCULAR | Status: AC | PRN
  Administered 2018-06-07: 40 mg via INTRA_ARTICULAR

## 2018-06-07 NOTE — Therapy (Signed)
[facility] [address] [place], [place], [address] Phone: [phone]   Fax:  [phone]  Physical Therapy Treatment  Patient Details  Name: [name] MRN: [id number] Date of Birth: 15-Mar-1934 Referring Provider (PT): Venancio Poisson, NP   Encounter Date: 06/07/2018  PT End of Session - 06/07/18 0854    Visit Number  14    Number of Visits  17    Date for PT Re-Evaluation  05/16/18    Authorization Type  [employer]HC Medicare    Authorization Time Period  04/16/18 to 07/15/2018    PT Start Time  0852    PT Stop Time  0933    PT Time Calculation (min)  41 min    Equipment Utilized During Treatment  Gait belt    Activity Tolerance  Patient tolerated treatment well    Behavior During Therapy  [facility] for tasks assessed/performed       Past Medical History:  Diagnosis Date  . 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 Colon polyps   . Coronary atherosclerosis of unspecified type of vessel, native or graft   . Diabetes ([place])   . Difficult intubation   . Esophageal reflux   . High cholesterol   . History of IBS 02/27/2009  . Hypertension    pt denies, he says he has a h/o hypotension. If BP up he adjusts the Florinef  . IBS (irritable bowel syndrome)   . Orthostatic hypotension    "BP has been dropping alot when I stand up for the last month or so" (02/17/2016)  . Vitamin B 12 deficiency   . Vitamin D deficiency     Past Surgical History:  Procedure Laterality Date  . BLADDER SURGERY  1969   traumatic pelvic fractures, urethral and bladder repair  . CARDIAC CATHETERIZATION N/A 07/01/2015   Procedure: Left Heart Cath and Coronary Angiography;  Surgeon: Wellington Hampshire, MD;  Location: [facility];  Service: Cardiovascular;  Laterality: N/A;  . COLON RESECTION N/A 05/17/2017   Procedure: DIAGNOSTIC LAPAROSCOPY,;  Surgeon: Leighton Ruff, MD;  [facility];  Service: General;  Laterality: N/A;  . CORONARY  ANGIOPLASTY WITH STENT PLACEMENT    . CORONARY ARTERY BYPASS GRAFT N/A 07/06/2015   Procedure: CORONARY ARTERY BYPASS GRAFTING (CABG)x 4   utilizing the left internal mammary artery and endoscopically harvested bilateral  sapheneous vein.;  Surgeon: Ivin Poot, MD;  Location: [facility];  Service: Open Heart Surgery;  Laterality: N/A;  . KNEE SURGERY    . LOOP RECORDER INSERTION N/A 02/19/2018   Procedure: LOOP RECORDER INSERTION;  Surgeon: Deboraha Sprang, MD;  Location: [facility];  Service: Cardiovascular;  Laterality: N/A;  . TEE WITHOUT CARDIOVERSION N/A 07/06/2015   Procedure: TRANSESOPHAGEAL ECHOCARDIOGRAM (TEE);  Surgeon: Ivin Poot, MD;  Location: [facility];  Service: Open Heart Surgery;  Laterality: N/A;    Vitals:   06/07/18 0857  BP: (!) 142/98    Subjective Assessment - 06/07/18 0917    Subjective  [facility] because of his RLE pain. He sees the orthopedist later today re: RLE    Patient is accompained by:  Family member    Pertinent History  dysautonomia orthostatic hypotension, HTN, DDD, diabetic polyneuropathy, CKD stage 3; 01/24/18 acute right temporal CVA, Chronic bil basal ganglia and cerebellar infarcts, Advanced atherosclerosis involving the posterior circulation, chronic rt chest pain  s/p CABG 2017    Diagnostic tests  MRI 02/23/18 acute left splenium infarct ; MRI 01/24/18 acute right temporal CVA    Patient Stated Goals  to not feel dizzy all the time; to be able to work on his cars    Currently in Pain?  Yes    Pain Score  5     Pain Location  Knee    Pain Orientation  Right    Pain Descriptors / Indicators  Aching    Pain Type  Acute pain    Pain Radiating Towards  hip/groin anterior thigh    Pain Onset  1 to 4 weeks ago    Pain Frequency  Constant    Pain Relieving Factors  can ease it resting in recliner         [facility] PT Assessment - 06/07/18 0001      Ambulation/Gait   Ambulation/Gait  Assistance  5: Supervision    Ambulation/Gait Assistance Details  slightly wide BOS, using cane, no overt imbalance    Gait velocity  32.8/11.66=2.81      Standardized Balance Assessment   Standardized Balance Assessment  Dynamic Gait Index      Dynamic Gait Index   Level Surface  Mild Impairment    Change in Gait Speed  Mild Impairment    Gait with Horizontal Head Turns  Mild Impairment    Gait with Vertical Head Turns  Mild Impairment    Gait and Pivot Turn  Mild Impairment    Step Over Obstacle  Mild Impairment    Step Around Obstacles  Mild Impairment    Steps  Mild Impairment    Total Score  16      Timed Up and Go Test   Normal TUG (seconds)  10.8                   [facility] Adult PT Treatment/Exercise - 06/07/18 0001      Ambulation/Gait   Ambulation Distance (Feet)  200 Feet    Assistive device  Straight cane    Gait Pattern  Step-through pattern;Decreased trunk rotation;Trunk flexed;Wide base of support;Decreased stride length;Decreased dorsiflexion - right;Decreased dorsiflexion - left      Knee/Hip Exercises: Aerobic   Nustep  L4 x 4 minutes; LEs only BP stable             PT Education - 06/07/18 1604    Education Details  results of LTGs assessed; plan for one final visit to finalize HEP and plan to discharge at that time    Person(s) Educated  Patient;Spouse    Methods  Explanation    Comprehension  Verbalized understanding       PT Short Term Goals - 05/16/18 1010      PT SHORT TERM GOAL #1   Title  independent with initial HEP (Target for all STGs 05/16/2018)    Time  4    Period  Weeks    Status  Achieved      PT SHORT TERM GOAL #2   Title  Patient will ambulate with LRAD @ gait velocity of 2.62 ft/sec (indicative of velocity needed for safe community ambulation)    Baseline  04/16/18  2.16 ft/sec ; 05/16/18 2.68 ft/sec    Time  4    Period  Weeks    Status  Achieved      PT SHORT TERM GOAL #3   Title  Pt will improve TUG score to  <=17 sec demonstrating a lesser  fall risk.     Baseline  12/16  TUG 20.88 sec; 05/16/18 13.1 sec    Time  4    Period  Weeks    Status  Achieved        PT Long Term Goals - 06/07/18 0910      PT LONG TERM GOAL #1   Title  independent with updated HEP (Target all LTGs 06/15/2018)    Time  8    Period  Weeks    Status  On-going      PT LONG TERM GOAL #2   Title  Score on DHI will decr >=18 points    Baseline  04/24/18   72  06/07/18 52 (20 point decr)    Time  8    Period  Weeks    Status  Achieved      PT LONG TERM GOAL #3   Title  Gait velocity will improve to >=2.8 ft/sec to demonstrate lesser fall risk.     Baseline  06/07/18 2.81 ft/sec    Time  8    Period  Weeks    Status  Achieved      PT LONG TERM GOAL #4   Title  Pt will perform TUG in <=15 sec    Baseline  05/16/18  13.1 sec with SPC; 2/6 10.8 sec    Time  8    Period  Weeks    Status  Achieved            Plan - 06/07/18 1611    Clinical Impression Statement  Assessed 3 of 4 LTGs with pt meeting all 3. Final goal related to HEP and will be assessed at his final visit. Patient showed significant improvement with Dizziness Handicap Index 52 (down 20 points), TUG with cane 10.8 seconds, and gait velocity 2.81 ft/sec (above the community ambulator minimum of 2.62 Ft/sec). Discussed plan to at least continue his balance exercises upon discharge from PT (hopeful his RLE pain will decrease and alllow [facility] Plan for discharge at next visit.     Rehab Potential  Good    Clinical Impairments Affecting Rehab Potential  dizziness present >2 yrs    PT Frequency  2x / week    PT Duration  8 weeks    PT Treatment/Interventions  ADLs/Self Care Home Management;Canalith Repostioning;DME Instruction;Gait training;Stair training;Functional mobility training;Therapeutic activities;Therapeutic exercise;Balance training;Patient/family education;Neuromuscular re-education;Manual  techniques;Vestibular;Visual/perceptual remediation/compensation    PT Next Visit Plan  what did orthopedic MD say? check/finalize HEP and discharge    Consulted and Agree with Plan of Care  Patient;Family member/caregiver    Family Member Consulted  wife, [name]       Patient will benefit from skilled therapeutic intervention in order to improve the following deficits and impairments:  Abnormal gait, Cardiopulmonary status limiting activity, Decreased activity tolerance, Decreased balance, Decreased knowledge of use of DME, Decreased mobility, Decreased safety awareness, Dizziness, Impaired perceived functional ability, Impaired vision/preception, Impaired sensation, Postural dysfunction  Visit Diagnosis: Dizziness and giddiness  Other symptoms and signs involving the nervous system  Other abnormalities of gait and mobility     Problem List Patient Active Problem List   Diagnosis Date Noted  . History of lacunar cerebrovascular accident (CVA) 02/12/2018  . Dizziness 02/12/2018  . Myofascial pain 12/18/2017  . Spondylosis without myelopathy or radiculopathy, lumbar region 12/18/2017  . Lumbar radiculopathy, right 12/18/2017  . Cholelithiasis 05/17/2017  . Sinus Bradycardia 05/17/2017  . Thoracic radiculopathy  04/12/2017  . Primary osteoarthritis of right knee 12/14/2016  . DDD (degenerative disc disease), lumbar 10/10/2016  . Lumbar facet arthropathy 08/29/2016  . Idiopathic scoliosis 03/22/2016  . Diabetic neuropathy ([place]) 03/22/2016  . Dysautonomia orthostatic hypotension syndrome ([place]) 02/25/2016  . CKD stage 3 due to type 2 diabetes mellitus ([place]) 02/19/2016  . Coronary artery disease involving coronary bypass graft of native heart without angina pectoris   . Diabetes mellitus type 2, diet-controlled ([place])   . 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 PVD (peripheral vascular disease) ([place]) 01/01/2014  . Hyperlipidemia associated with type 2 diabetes  mellitus ([place]) 04/11/2013  . Supine hypertension   . Vitamin D deficiency   . Vitamin B 12 deficiency   . Coronary atherosclerosis- s/p PCI to LAD in 2009 and PCI to RCA in 2011 02/27/2009  . Abdominal aortic aneurysm ([place]) 02/27/2009  . GERD 02/27/2009    Rexanne Mano, PT 06/07/2018, 4:17 PM  [facility] [address] [place], [place], [address] Phone: [phone]   Fax:  [phone]  Name: RUE TINNEL MRN: [id number] Date of Birth: 1933/11/13

## 2018-06-07 NOTE — Progress Notes (Signed)
Office Visit Note   Patient: [name]           Date of Birth: Aug 02, 1933           MRN: [id number] Visit Date: 06/07/2018              Requested by: Liane Comber, NP [address] [place], [place] [address] PCP: [name], MD   Assessment & Plan: Visit Diagnoses:  1. Primary osteoarthritis of right knee   2. Trochanteric bursitis of right hip     Plan: We will have him perform IT band stretching exercises which he is shown today.  In regards to his knee we will see how he responds to the cortisone injection today.  He will monitor his glucose levels closely over the next few days and his glucose does rise significantly he is to contact his primary care physician.  Questions encouraged and answered at length.  He will follow-up with Dr. Ninfa Linden in 2 weeks check his response to the cortisone injection.  Follow-Up Instructions: Return in about 2 weeks (around 06/21/2018) for Dr. Ninfa Linden.   Orders:  Orders Placed This Encounter  Procedures  . Large Joint Inj  . XR HIP UNILAT W OR W/O PELVIS 2-3 VIEWS RIGHT  . XR Knee 1-2 Views Right   No orders of the defined types were placed in this encounter.     Procedures: Large Joint Inj: R knee on 06/07/2018 4:18 PM Indications: pain Details: 22 G 1.5 in needle, anterolateral approach  Arthrogram: No  Medications: 3 mL lidocaine 1 %; 40 mg methylPREDNISolone acetate 40 MG/ML Outcome: tolerated well, no immediate complications Procedure, treatment alternatives, risks and benefits explained, specific risks discussed. Consent was given by the patient. Immediately prior to procedure a time out was called to verify the correct patient, procedure, equipment, support staff and site/side marked as required. Patient was prepped and draped in the usual sterile fashion.       Clinical Data: No additional findings.   Subjective: Chief Complaint  Patient presents with  . Right Hip - Pain  . Right Knee -  Pain    HPI [name] comes in today due to right hip and right knee pain.  He describes no radicular symptoms down the right leg.  He has had chronic low back pain but most of his pain is lateral aspect of the right hip and knee pain anterior medial aspect.  Pain began about 3 weeks ago.  He has had no known injury.  He was given prednisone by his primary care physician which helps some.  He has had no falls or injuries.  Had previous arthroscopies in both knees.  Medical history significant for CVA, coronary artery disease, orthostatic hypotension and diabetes mellitus diet-controlled.  He is on chronic Plavix.  Review of Systems Please see HPI otherwise negative or noncontributory.  Objective: Vital Signs: Ht 6' (1.829 m)   Wt 166 lb 9.6 oz (75.6 kg)   BMI 22.60 kg/m   Physical Exam Constitutional:      Appearance: He is normal weight. He is not ill-appearing.  Pulmonary:     Effort: Pulmonary effort is normal.  Neurological:     Mental Status: He is alert and oriented to person, place, and time.     Ortho Exam Bilateral hips good range of motion without pain.  Tenderness right trochanteric region down the IT band.  Straight leg raise is negative bilaterally.  Bilateral knees without effusion abnormal warmth  erythema.  Left knee full range of motion.  Right knee lacks the last document 10 degrees full extension has flexion beyond 90 degrees.  No instability valgus varus stressing of either knee.  Right knee has tenderness over the medial joint line and anterior medial joint line.  Calves are supple and nontender bilaterally. Specialty Comments:  No specialty comments available.  Imaging: Xr Hip Unilat W Or W/o Pelvis 2-3 Views Right  Result Date: 06/07/2018 AP pelvis lateral view of the right hip: Bilateral hips well located.  No acute fractures.  Hip joints well maintained.  Generative changes of the lower lumbar spine are seen on the AP view of the pelvis.  Xr Knee 1-2 Views  Right  Result Date: 06/07/2018 AP lateral views right knee: Near bone-on-bone medial compartment.  Moderate patellofemoral arthritic changes.  No acute fracture subluxation dislocation knee.  Ossified calcifications posterior medial aspect of the knee consistent with intra-articular loose bodies are unchanged from prior films in 2018.  No acute findings otherwise.    PMFS History: Patient Active Problem List   Diagnosis Date Noted  . History of lacunar cerebrovascular accident (CVA) 02/12/2018  . Dizziness 02/12/2018  . Myofascial pain 12/18/2017  . Spondylosis without myelopathy or radiculopathy, lumbar region 12/18/2017  . Lumbar radiculopathy, right 12/18/2017  . Cholelithiasis 05/17/2017  . Sinus Bradycardia 05/17/2017  . Thoracic radiculopathy 04/12/2017  . Primary osteoarthritis of right knee 12/14/2016  . DDD (degenerative disc disease), lumbar 10/10/2016  . Lumbar facet arthropathy 08/29/2016  . Idiopathic scoliosis 03/22/2016  . Diabetic neuropathy ([place]) 03/22/2016  . Dysautonomia orthostatic hypotension syndrome ([place]) 02/25/2016  . CKD stage 3 due to type 2 diabetes mellitus ([place]) 02/19/2016  . Coronary artery disease involving coronary bypass graft of native heart without angina pectoris   . Diabetes mellitus type 2, diet-controlled ([place])   . 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 PVD (peripheral vascular disease) ([place]) 01/01/2014  . Hyperlipidemia associated with type 2 diabetes mellitus ([place]) 04/11/2013  . Supine hypertension   . Vitamin D deficiency   . Vitamin B 12 deficiency   . Coronary atherosclerosis- s/p PCI to LAD in 2009 and PCI to RCA in 2011 02/27/2009  . Abdominal aortic aneurysm ([place]) 02/27/2009  . GERD 02/27/2009   Past Medical History:  Diagnosis Date  . 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 Colon polyps   . Coronary atherosclerosis of unspecified type of vessel, native or graft   . Diabetes ([place])   . Difficult intubation   .  Esophageal reflux   . High cholesterol   . History of IBS 02/27/2009  . Hypertension    pt denies, he says he has a h/o hypotension. If BP up he adjusts the Florinef  . IBS (irritable bowel syndrome)   . Orthostatic hypotension    "BP has been dropping alot when I stand up for the last month or so" (02/17/2016)  . Vitamin B 12 deficiency   . Vitamin D deficiency     Family History  Problem Relation Age of Onset  . Heart attack Father        died age 25  . Heart attack Brother        died age 4  . Anuerysm Brother        aortic  . Heart attack Sister        died age 44  . Colon cancer Sister   . Liver cancer Sister   .  Diabetes Maternal Grandmother   . Colon polyps Sister        and brothers x 2     Past Surgical History:  Procedure Laterality Date  . BLADDER SURGERY  1969   traumatic pelvic fractures, urethral and bladder repair  . CARDIAC CATHETERIZATION N/A 07/01/2015   Procedure: Left Heart Cath and Coronary Angiography;  Surgeon: Wellington Hampshire, MD;  Location: [facility];  Service: Cardiovascular;  Laterality: N/A;  . COLON RESECTION N/A 05/17/2017   Procedure: DIAGNOSTIC LAPAROSCOPY,;  Surgeon: Leighton Ruff, MD;  Location: [facility];  Service: General;  Laterality: N/A;  . CORONARY ANGIOPLASTY WITH STENT PLACEMENT    . CORONARY ARTERY BYPASS GRAFT N/A 07/06/2015   Procedure: CORONARY ARTERY BYPASS GRAFTING (CABG)x 4   utilizing the left internal mammary artery and endoscopically harvested bilateral  sapheneous vein.;  Surgeon: Ivin Poot, MD;  Location: [facility];  Service: Open Heart Surgery;  Laterality: N/A;  . KNEE SURGERY    . LOOP RECORDER INSERTION N/A 02/19/2018   Procedure: LOOP RECORDER INSERTION;  Surgeon: Deboraha Sprang, MD;  Location: [facility];  Service: Cardiovascular;  Laterality: N/A;  . TEE WITHOUT CARDIOVERSION N/A 07/06/2015   Procedure: TRANSESOPHAGEAL ECHOCARDIOGRAM (TEE);  Surgeon: Ivin Poot, MD;  Location: [facility];  Service:  Open Heart Surgery;  Laterality: N/A;   Social History   Occupational History  . Occupation: Retired  Tobacco Use  . Smoking status: Former Smoker    Last attempt to quit: 07/16/1963    Years since quitting: 54.9  . Smokeless tobacco: Former Systems developer    Types: Chew    Quit date: 1989  Substance and Sexual Activity  . Alcohol use: No  . Drug use: No  . Sexual activity: Not Currently

## 2018-06-11 ENCOUNTER — Ambulatory Visit: Payer: Medicare Other | Admitting: Physical Therapy

## 2018-06-13 ENCOUNTER — Encounter: Payer: Self-pay | Admitting: Physical Therapy

## 2018-06-13 ENCOUNTER — Ambulatory Visit: Payer: Medicare Other | Admitting: Physical Therapy

## 2018-06-13 DIAGNOSIS — R2689 Other abnormalities of gait and mobility: Secondary | ICD-10-CM

## 2018-06-13 DIAGNOSIS — R42 Dizziness and giddiness: Secondary | ICD-10-CM | POA: Diagnosis not present

## 2018-06-13 DIAGNOSIS — R29818 Other symptoms and signs involving the nervous system: Secondary | ICD-10-CM

## 2018-06-13 NOTE — Therapy (Signed)
[facility] [address] [place], [place], [address] Phone: [phone]   Fax:  [phone]  Physical Therapy Treatment and Discharge Note  Patient Details  Name: [name] MRN: [id number] Date of Birth: January 30, 1934 Referring Provider (PT): Venancio Poisson, NP   Encounter Date: 06/13/2018  PT End of Session - 06/13/18 0833    Visit Number  15    Number of Visits  17    Date for PT Re-Evaluation  05/16/18    Authorization Type  [employer] Medicare    Authorization Time Period  04/16/18 to 07/15/2018    PT Start Time  0835    PT Stop Time  0925    PT Time Calculation (min)  50 min    Equipment Utilized During Treatment  Gait belt    Activity Tolerance  Patient tolerated treatment well    Behavior During Therapy  [facility] for tasks assessed/performed       Past Medical History:  Diagnosis Date  . 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 Colon polyps   . Coronary atherosclerosis of unspecified type of vessel, native or graft   . Diabetes ([place])   . Difficult intubation   . Esophageal reflux   . High cholesterol   . History of IBS 02/27/2009  . Hypertension    pt denies, he says he has a h/o hypotension. If BP up he adjusts the Florinef  . IBS (irritable bowel syndrome)   . Orthostatic hypotension    "BP has been dropping alot when I stand up for the last month or so" (02/17/2016)  . Vitamin B 12 deficiency   . Vitamin D deficiency     Past Surgical History:  Procedure Laterality Date  . BLADDER SURGERY  1969   traumatic pelvic fractures, urethral and bladder repair  . CARDIAC CATHETERIZATION N/A 07/01/2015   Procedure: Left Heart Cath and Coronary Angiography;  Surgeon: Wellington Hampshire, MD;  Location: [facility];  Service: Cardiovascular;  Laterality: N/A;  . COLON RESECTION N/A 05/17/2017   Procedure: DIAGNOSTIC LAPAROSCOPY,;  Surgeon: Leighton Ruff, MD;  [facility];  Service: General;  Laterality:  N/A;  . CORONARY ANGIOPLASTY WITH STENT PLACEMENT    . CORONARY ARTERY BYPASS GRAFT N/A 07/06/2015   Procedure: CORONARY ARTERY BYPASS GRAFTING (CABG)x 4   utilizing the left internal mammary artery and endoscopically harvested bilateral  sapheneous vein.;  Surgeon: Ivin Poot, MD;  Location: [facility];  Service: Open Heart Surgery;  Laterality: N/A;  . KNEE SURGERY    . LOOP RECORDER INSERTION N/A 02/19/2018   Procedure: LOOP RECORDER INSERTION;  Surgeon: Deboraha Sprang, MD;  Location: [facility];  Service: Cardiovascular;  Laterality: N/A;  . TEE WITHOUT CARDIOVERSION N/A 07/06/2015   Procedure: TRANSESOPHAGEAL ECHOCARDIOGRAM (TEE);  Surgeon: Ivin Poot, MD;  Location: [facility];  Service: Open Heart Surgery;  Laterality: N/A;    There were no vitals filed for this visit.  Subjective Assessment - 06/13/18 0834    Subjective  Saw the orthopedist and gave a shot in his right knee and taught IT band stretch for rt hip.     Patient is accompained by:  Family member    Pertinent History  dysautonomia orthostatic hypotension, HTN, DDD, diabetic polyneuropathy, CKD stage 3; 01/24/18 acute right temporal CVA, Chronic bil basal ganglia and cerebellar infarcts, Advanced atherosclerosis involving the posterior circulation, chronic rt chest pain s/p CABG 2017    Diagnostic tests  MRI 02/23/18 acute left splenium infarct ; MRI 01/24/18 acute right temporal CVA    Patient Stated Goals  to not feel dizzy all the time; to be able to work on his cars    Currently in Pain?  Yes    Pain Score  5     Pain Location  Knee    Pain Orientation  Right    Pain Descriptors / Indicators  Aching    Pain Type  Acute pain    Pain Onset  1 to 4 weeks ago    Pain Frequency  Constant    Aggravating Factors   sleeping, walking too much    Multiple Pain Sites  Yes    Pain Score  4    Pain Location  Hip    Pain Orientation  Right    Pain Descriptors / Indicators  Aching    Pain Type  Acute pain    Pain Onset  1  to 4 weeks ago    Pain Frequency  Constant    Aggravating Factors   unsure                       [facility] Adult PT Treatment/Exercise - 06/13/18 0001      Exercises   Other Exercises   pt demonstrated IT band given by ortho PA  and does movement quickly and told to repeat 30 times, every other day. Per ortho note, he was given an IT band stretch, therefore instructed pt in using sidelying with RLE on top, bending at hip and resting leg on a pillow to elevate leg to point where he feels a MILD stretch and hold x 30 seconds. Instructed pt to call ortho office with questions re: the stretch       Knee/Hip Exercises: Aerobic   Stepper  SciFit L1.5 x 5 minutes, legs only          Balance Exercises - 06/13/18 1240      Balance Exercises: Standing   Standing Eyes Opened  Narrow base of support (BOS);Head turns;Solid surface   partial tandem, feet 1.5" apart   Standing Eyes Closed  Narrow base of support (BOS);Solid surface   feet 1" apart +sway       PT Education - 06/13/18 1241    Education Details  finalized HEP and pt demonstrated; again reviewed monitoring BP when he returns to the gym to ride recumbent bike (he hopes to resume as RLE pain decreases)    Person(s) Educated  Patient;Spouse    Methods  Explanation;Demonstration;Handout    Comprehension  Verbalized understanding;Returned demonstration       PT Short Term Goals - 05/16/18 1010      PT SHORT TERM GOAL #1   Title  independent with initial HEP (Target for all STGs 05/16/2018)    Time  4    Period  Weeks    Status  Achieved      PT SHORT TERM GOAL #2   Title  Patient will ambulate with LRAD @ gait velocity of 2.62 ft/sec (indicative of velocity needed for safe community ambulation)    Baseline  04/16/18  2.16 ft/sec ; 05/16/18 2.68 ft/sec    Time  4    Period  Weeks    Status  Achieved      PT SHORT TERM GOAL #3   Title  Pt will improve TUG score to <=17 sec demonstrating a lesser fall risk.      Baseline  12/16  TUG 20.88 sec; 05/16/18 13.1 sec    Time  4    Period  Weeks    Status  Achieved        PT Long Term Goals - 06/13/18 1243      PT LONG TERM GOAL #1   Title  independent with updated HEP (Target all LTGs 06/15/2018)    Time  8    Period  Weeks    Status  Achieved      PT LONG TERM GOAL #2   Title  Score on DHI will decr >=18 points    Baseline  04/24/18   72  06/07/18 52 (20 point decr)    Time  8    Period  Weeks    Status  Achieved      PT LONG TERM GOAL #3   Title  Gait velocity will improve to >=2.8 ft/sec to demonstrate lesser fall risk.     Baseline  06/07/18 2.81 ft/sec    Time  8    Period  Weeks    Status  Achieved      PT LONG TERM GOAL #4   Title  Pt will perform TUG in <=15 sec    Baseline  05/16/18  13.1 sec with SPC; 2/6 10.8 sec    Time  8    Period  Weeks    Status  Achieved            Plan - 06/13/18 1243    Clinical Impression Statement  Session focused on finalizing HEP and reviewing BP parameters for when he returns to using recumbent bicycle at the gym (<180/100). Patient met final goal today (goal #1 related to HEP), therefore met 4 of 4 goals. Patient and wife report how much better pt is doing now than prior to therapy and verbalized understanding of importance of continuing exercises. Patient agrees with discharge.     Rehab Potential  Good    Clinical Impairments Affecting Rehab Potential  dizziness present >2 yrs    PT Frequency  2x / week    PT Duration  8 weeks    PT Treatment/Interventions  ADLs/Self Care Home Management;Canalith Repostioning;DME Instruction;Gait training;Stair training;Functional mobility training;Therapeutic activities;Therapeutic exercise;Balance training;Patient/family education;Neuromuscular re-education;Manual techniques;Vestibular;Visual/perceptual remediation/compensation    Consulted and Agree with Plan of Care  Patient;Family member/caregiver    Family Member Consulted  wife, [name]       Patient  will benefit from skilled therapeutic intervention in order to improve the following deficits and impairments:  Abnormal gait, Cardiopulmonary status limiting activity, Decreased activity tolerance, Decreased balance, Decreased knowledge of use of DME, Decreased mobility, Decreased safety awareness, Dizziness, Impaired perceived functional ability, Impaired vision/preception, Impaired sensation, Postural dysfunction  Visit Diagnosis: Dizziness and giddiness  Other symptoms and signs involving the nervous system  Other abnormalities of gait and mobility     Problem List Patient Active Problem List   Diagnosis Date Noted  . History of lacunar cerebrovascular accident (CVA) 02/12/2018  . Dizziness 02/12/2018  . Myofascial pain 12/18/2017  . Spondylosis without myelopathy or radiculopathy, lumbar region 12/18/2017  . Lumbar radiculopathy, right 12/18/2017  . Cholelithiasis 05/17/2017  . Sinus Bradycardia 05/17/2017  . Thoracic radiculopathy 04/12/2017  . Primary osteoarthritis of right knee 12/14/2016  . DDD (degenerative disc disease), lumbar 10/10/2016  . Lumbar facet arthropathy 08/29/2016  . Idiopathic scoliosis 03/22/2016  . Diabetic neuropathy ([place]) 03/22/2016  . Dysautonomia orthostatic hypotension syndrome ([place]) 02/25/2016  . CKD stage 3 due to type  2 diabetes mellitus ([place]) 02/19/2016  . Coronary artery disease involving coronary bypass graft of native heart without angina pectoris   . Diabetes mellitus type 2, diet-controlled ([place])   . 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 PVD (peripheral vascular disease) ([place]) 01/01/2014  . Hyperlipidemia associated with type 2 diabetes mellitus ([place]) 04/11/2013  . Supine hypertension   . Vitamin D deficiency   . Vitamin B 12 deficiency   . Coronary atherosclerosis- s/p PCI to LAD in 2009 and PCI to RCA in 2011 02/27/2009  . Abdominal aortic aneurysm ([place]) 02/27/2009  . GERD 02/27/2009   PHYSICAL THERAPY  DISCHARGE SUMMARY  Visits from Start of Care: 15  Current functional level related to goals / functional outcomes: See goals above   Remaining deficits: Dizziness, blurry vision, imbalance   Education / Equipment: HEP  Plan: Patient agrees to discharge.  Patient goals were met. Patient is being discharged due to meeting the stated rehab goals.  ?????       Rexanne Mano, PT 06/13/2018, 12:52 PM  [facility] [address] [place], [place], [address] Phone: [phone]   Fax:  [phone]  Name: BLAS RICHES MRN: [id number] Date of Birth: 1933/10/15

## 2018-06-13 NOTE — Patient Instructions (Signed)
Feet Partial Heel-Toe, Head Motion - Eyes Open    Stand with back to the corner with eyes open, right foot partially in front of the other (1/2 the length of your foot), move head slowly:right and left x 10, then up and down x 10. Your head AND eyes move together  Do __1__ sessions per day.    Backward    At the kitchen counter, with your hand on the counter walk backwards with eyes open. Take even steps, making sure each foot lifts off floor. Make sure each foot passes the other.  Repeat for ___5 lengths_ Do _1-2___ sessions per day.

## 2018-06-17 ENCOUNTER — Other Ambulatory Visit: Payer: Self-pay | Admitting: Internal Medicine

## 2018-06-17 DIAGNOSIS — I10 Essential (primary) hypertension: Secondary | ICD-10-CM

## 2018-06-18 ENCOUNTER — Ambulatory Visit: Payer: Medicare Other | Admitting: Adult Health

## 2018-06-18 ENCOUNTER — Encounter: Payer: Self-pay | Admitting: Adult Health

## 2018-06-18 VITALS — BP 138/78 | HR 98 | Temp 97.9°F | Resp 16 | Wt 180.6 lb

## 2018-06-18 DIAGNOSIS — M25551 Pain in right hip: Secondary | ICD-10-CM | POA: Diagnosis not present

## 2018-06-18 DIAGNOSIS — G8929 Other chronic pain: Secondary | ICD-10-CM | POA: Diagnosis not present

## 2018-06-18 MED ORDER — TRAMADOL HCL 50 MG PO TABS: ORAL_TABLET | ORAL | 0 refills | Status: DC

## 2018-06-18 NOTE — Progress Notes (Signed)
Assessment and Plan:  Diagnoses and all orders for this visit:  Acute hip pain, right Hip bursitis per recent ortho note, has follow up scheduled in 2 days, had steroid injection already. Keep follow up and encouraged to thoroughly discuss management options Advised tylenol up to 3500 mg daily, take consistently, combine with voltaren, stretches Tramadol for severe pain only, PDMP checked, none in recent months, patient was advised I cannot refill this medication, temporary script to tide over with ortho/pain management -     traMADol (ULTRAM) 50 MG tablet; Take 1 tab up to every 6 hours as needed for severe pain.  Chronic pain Follow up as scheduled with Dr. Tessa Lerner, encouraged to emphasize lack of current adequate pain control significantly limiting rest, ADLs Can refer to new pain management provider if needed and not feeling adequately managed at current location The patient was advised to call immediately if he has any concerning symptoms in the interval. The patient voices understanding of current treatment options and is in agreement with the current care plan.  All questions were answered. The patient knows to call the clinic with any problems, questions or concerns or go to the ER if any sudden/severe further progression of symptoms.   Further disposition pending results of labs. Discussed med's effects and SE's.   Over 30 minutes of exam, counseling, chart review, and critical decision making was performed.   Future Appointments  Date Time Provider [place]  06/21/2018  3:30 PM Mcarthur Rossetti, MD [facility] None  06/28/2018  8:45 AM Venancio Poisson, NP [facility] None  07/02/2018  7:25 AM [facility] DEVICE REMOTES [facility]  07/30/2018  9:20 AM Meredith Staggers, MD [facility] CPR  08/15/2018 11:30 AM [name], MD [facility] None  12/03/2018  2:45 PM Venancio Poisson, NP [facility] None  02/20/2019  2:00 PM [name], MD [facility] None   05/29/2019  3:00 PM Liane Comber, NP [facility] None    ------------------------------------------------------------------------------------------------------------------   HPI 85 y.o.male with hx of scoliosis, DDD, thoracic radiculopathy/intercostal neuralgia, R knee OA presents for evaluation of worsening chronic pain and new acute R hip pain; he has been followed by Dr. Tessa Lerner for chronic pain, reports has been doing injections but not helpful. Has tried opioids in the past but would prefer to avoid if possible. He is frustrated as Dr. Tessa Lerner has discussed alternate interventions but "just won't do them."   He is additionally recently established with ortho Carney Bern, PA, due to acute worsening of R knee pain and also new R hip pain, was diagnosed with osteoarthritis or R knee, R hip trochanteric bursitis, got steroid injection and IT band stretching recommended which he has been doing, reports no significant improvement in pain. Has follow up with Dr. Ninfa Linden this Thursday.   Today reports pain is worst in hip, 7-10/10, R hip and knee, constant. He has tried ibuprofen, tylenol, but is not taking consistently due to lack of perceived benefit. He is additionally prescribed gabapentin 400 mg, voltaren gel but admits to not using consistently.   Past Medical History:  Diagnosis Date  . 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 Colon polyps   . Coronary atherosclerosis of unspecified type of vessel, native or graft   . Diabetes ([place])   . Difficult intubation   . Esophageal reflux   . High cholesterol   . History of IBS 02/27/2009  . Hypertension    pt denies, he says he has a h/o hypotension. If BP up he adjusts the  Florinef  . IBS (irritable bowel syndrome)   . Orthostatic hypotension    "BP has been dropping alot when I stand up for the last month or so" (02/17/2016)  . Vitamin B 12 deficiency   . Vitamin D deficiency      Allergies  Allergen Reactions  . Cymbalta [Duloxetine  Hcl]     Dizziness  . Keflex [Cephalexin] Other (See Comments)    Reaction: unknown   . Simvastatin Other (See Comments)    Reaction: unknown   . Sudafed [Pseudoephedrine]     Dizziness    Current Outpatient Medications on File Prior to Visit  Medication Sig  . acetaminophen (TYLENOL) 500 MG tablet Take 1,000 mg by mouth as needed for mild pain.  . aspirin 81 MG EC tablet TAKE 1 TABLET BY MOUTH EVERY DAY  . Blood Glucose Monitoring Suppl (ONE TOUCH ULTRA 2) w/Device KIT Check blood sugar 1 time daily-DX-E11.9.  . butalbital-acetaminophen-caffeine (FIORICET, ESGIC) 50-325-40 MG tablet Take 1 tablet every 4 hours ONLY if needed for severe Headache  . CALCIUM PO Take 1 tablet by mouth daily.  . Cholecalciferol (VITAMIN D-3) 5000 units TABS Take 5,000 Units by mouth daily.  . clopidogrel (PLAVIX) 75 MG tablet Take 1 tablet (75 mg total) by mouth daily. (Patient taking differently: Take 75 mg by mouth at bedtime. )  . diclofenac sodium (VOLTAREN) 1 % GEL APPLY 2 GRAMS TOPICALLY TO AFFECTED AREA 4 TIMES DAILY  . fludrocortisone (FLORINEF) 0.1 MG tablet Take 0.1 mg by mouth daily.   . gabapentin (NEURONTIN) 100 MG capsule TAKE 2 CAPSULE(200 MG) BY MOUTH 2 TIMES A DAY. (Patient taking differently: Take 200 mg by mouth 2 (two) times daily. TAKE 2 CAPSULE(200 MG) BY MOUTH 2 TIMES A DAY.)  . glucose blood test strip Check blood sugar 1 time daily-DX-E11.9  . Multiple Vitamin (MULTIVITAMIN WITH MINERALS) TABS tablet Take 1 tablet by mouth daily.  . olmesartan (BENICAR) 40 MG tablet TAKE 1/2 TO 1 TABLET BY MOUTH EVERY DAY FOR BLOOD PRESSURE  . polyethylene glycol (MIRALAX / GLYCOLAX) packet Take 17 g by mouth daily. (Patient taking differently: Take 17 g by mouth 2 (two) times daily. )   No current facility-administered medications on file prior to visit.     ROS: all negative except above.   Physical Exam:  BP 138/78   Pulse 98   Temp 97.9 F (36.6 C)   Resp 16   Wt 180 lb 9.6 oz (81.9 kg)    SpO2 99%   BMI 24.49 kg/m   General Appearance: Well nourished, in no acute distress. Eyes: conjunctiva no swelling or erythema ENT/Mouth: Hearing normal.  Neck: Supple Respiratory: Respiratory effort normal, BS equal bilaterally without rales, rhonchi, wheezing or stridor.  Cardio: RRR with no MRGs. Brisk peripheral pulses without edema.  Abdomen: Soft, + BS.  Non tender, no guarding, rebound, hernias, masses. Lymphatics: Non tender without lymphadenopathy.  Musculoskeletal: ROM limited by pain in multiple spheres of multiple joints, no obvious effusion, heat to joints, erythema; symmetrical strength, slow unsteady gait with cane Skin: Warm, dry without rashes, lesions, ecchymosis.  Neuro: Cranial nerves intact. Normal muscle tone, Sensation intact.  Psych: Awake and oriented X 3, normal affect, Insight and Judgment appropriate.      C , NP 2:48 PM [facility]  

## 2018-06-18 NOTE — Patient Instructions (Addendum)
Can do tylenol up to 3000-4000 mg daily - every 4-6 hours, etc  Capsaicin cream   Salon - pas patches      Chronic Pain, Adult Chronic pain is a type of pain that lasts or keeps coming back (recurs) for at least six months. You may have chronic headaches, abdominal pain, or body pain. Chronic pain may be related to an illness, such as fibromyalgia or complex regional pain syndrome. Sometimes the cause of chronic pain is not known. Chronic pain can make it hard for you to do daily activities. If not treated, chronic pain can lead to other health problems, including anxiety and depression. Treatment depends on the cause and severity of your pain. You may need to work with a pain specialist to come up with a treatment plan. The plan may include medicine, counseling, and physical therapy. Many people benefit from a combination of two or more types of treatment to control their pain. Follow these instructions at home: Lifestyle  Consider keeping a pain diary to share with your health care providers.  Consider talking with a mental health care provider (psychologist) about how to cope with chronic pain.  Consider joining a chronic pain support group.  Try to control or lower your stress levels. Talk to your health care provider about strategies to do this. General instructions   Take over-the-counter and prescription medicines only as told by your health care provider.  Follow your treatment plan as told by your health care provider. This may include: ? Gentle, regular exercise. ? Eating a healthy diet that includes foods such as vegetables, fruits, fish, and lean meats. ? Cognitive or behavioral therapy. ? Working with a Community education officer. ? Meditation or yoga. ? Acupuncture or massage therapy. ? Aroma, color, light, or sound therapy. ? Local electrical stimulation. ? Shots (injections) of numbing or pain-relieving medicines into the spine or the area of pain.  Check your pain level  as told by your health care provider. Ask your health care provider if you should use a pain scale.  Learn as much as you can about how to manage your chronic pain. Ask your health care provider if an intensive pain rehabilitation program or a chronic pain specialist would be helpful.  Keep all follow-up visits as told by your health care provider. This is important. Contact a health care provider if:  Your pain gets worse.  You have new pain.  You have trouble sleeping.  You have trouble doing your normal activities.  Your pain is not controlled with treatment.  Your have side effects from pain medicine.  You feel weak. Get help right away if:  You lose feeling or have numbness in your body.  You lose control of bowel or bladder function.  Your pain suddenly gets much worse.  You develop shaking or chills.  You develop confusion.  You develop chest pain.  You have trouble breathing or shortness of breath.  You pass out.  You have thoughts about hurting yourself or others. This information is not intended to replace advice given to you by your health care provider. Make sure you discuss any questions you have with your health care provider. Document Released: 01/08/2002 Document Revised: 12/17/2015 Document Reviewed: 10/06/2015 Elsevier Interactive Patient Education  2019 [place].   Tramadol tablets What is this medicine? TRAMADOL (TRA ma dole) is a pain reliever. It is used to treat moderate to severe pain in adults. This medicine may be used for other purposes; ask your health  care provider or pharmacist if you have questions. COMMON BRAND NAME(S): Ultram What should I tell my health care provider before I take this medicine? They need to know if you have any of these conditions: -brain tumor -depression -drug abuse or addiction -head injury -if you frequently drink alcohol containing drinks -kidney disease or trouble passing urine -liver  disease -lung disease, asthma, or breathing problems -seizures or epilepsy -suicidal thoughts, plans, or attempt; a previous suicide attempt by you or a family member -an unusual or allergic reaction to tramadol, codeine, other medicines, foods, dyes, or preservatives -pregnant or trying to get pregnant -breast-feeding How should I use this medicine? Take this medicine by mouth with a full glass of water. Follow the directions on the prescription label. You can take it with or without food. If it upsets your stomach, take it with food. Do not take your medicine more often than directed. A special MedGuide will be given to you by the pharmacist with each prescription and refill. Be sure to read this information carefully each time. Talk to your pediatrician regarding the use of this medicine in children. Special care may be needed. Overdosage: If you think you have taken too much of this medicine contact a poison control center or emergency room at once. NOTE: This medicine is only for you. Do not share this medicine with others. What if I miss a dose? If you miss a dose, take it as soon as you can. If it is almost time for your next dose, take only that dose. Do not take double or extra doses. What may interact with this medicine? Do not take this medication with any of the following medicines: -MAOIs like Carbex, Eldepryl, Marplan, Nardil, and Parnate This medicine may also interact with the following medications: -alcohol -antihistamines for allergy, cough and cold -certain medicines for anxiety or sleep -certain medicines for depression like amitriptyline, fluoxetine, sertraline -certain medicines for migraine headache like almotriptan, eletriptan, frovatriptan, naratriptan, rizatriptan, sumatriptan, zolmitriptan -certain medicines for seizures like carbamazepine, oxcarbazepine, phenobarbital, primidone -certain medicines that treat or prevent blood clots like  warfarin -digoxin -furazolidone -general anesthetics like halothane, isoflurane, methoxyflurane, propofol -linezolid -local anesthetics like lidocaine, pramoxine, tetracaine -medicines that relax muscles for surgery -other narcotic medicines for pain or cough -phenothiazines like chlorpromazine, mesoridazine, prochlorperazine, thioridazine -procarbazine This list may not describe all possible interactions. Give your health care provider a list of all the medicines, herbs, non-prescription drugs, or dietary supplements you use. Also tell them if you smoke, drink alcohol, or use illegal drugs. Some items may interact with your medicine. What should I watch for while using this medicine? Tell your doctor or health care professional if your pain does not go away, if it gets worse, or if you have new or a different type of pain. You may develop tolerance to the medicine. Tolerance means that you will need a higher dose of the medicine for pain relief. Tolerance is normal and is expected if you take this medicine for a long time. Do not suddenly stop taking your medicine because you may develop a severe reaction. Your body becomes used to the medicine. This does NOT mean you are addicted. Addiction is a behavior related to getting and using a drug for a non-medical reason. If you have pain, you have a medical reason to take pain medicine. Your doctor will tell you how much medicine to take. If your doctor wants you to stop the medicine, the dose will be slowly lowered over time  to avoid any side effects. There are different types of narcotic medicines (opiates). If you take more than one type at the same time or if you are taking another medicine that also causes drowsiness, you may have more side effects. Give your health care provider a list of all medicines you use. Your doctor will tell you how much medicine to take. Do not take more medicine than directed. Call emergency for help if you have problems  breathing or unusual sleepiness. You may get drowsy or dizzy. Do not drive, use machinery, or do anything that needs mental alertness until you know how this medicine affects you. Do not stand or sit up quickly, especially if you are an older patient. This reduces the risk of dizzy or fainting spells. Alcohol can increase or decrease the effects of this medicine. Avoid alcoholic drinks. You may have constipation. Try to have a bowel movement at least every 2 to 3 days. If you do not have a bowel movement for 3 days, call your doctor or health care professional. Your mouth may get dry. Chewing sugarless gum or sucking hard candy, and drinking plenty of water may help. Contact your doctor if the problem does not go away or is severe. What side effects may I notice from receiving this medicine? Side effects that you should report to your doctor or health care professional as soon as possible: -allergic reactions like skin rash, itching or hives, swelling of the face, lips, or tongue -breathing problems -confusion -seizures -signs and symptoms of low blood pressure like dizziness; feeling faint or lightheaded, falls; unusually weak or tired -trouble passing urine or change in the amount of urine Side effects that usually do not require medical attention (report to your doctor or health care professional if they continue or are bothersome): -constipation -dry mouth -nausea, vomiting -tiredness This list may not describe all possible side effects. Call your doctor for medical advice about side effects. You may report side effects to FDA at 1-800-FDA-1088. Where should I keep my medicine? Keep out of the reach of children. This medicine may cause accidental overdose and death if it taken by other adults, children, or pets. Mix any unused medicine with a substance like cat litter or coffee grounds. Then throw the medicine away in a sealed container like a sealed bag or a coffee can with a lid. Do not use  the medicine after the expiration date. Store at room temperature between 15 and 30 degrees C (59 and 86 degrees F). NOTE: This sheet is a summary. It may not cover all possible information. If you have questions about this medicine, talk to your doctor, pharmacist, or health care provider.  2019 Elsevier/Gold Standard (2015-01-11 09:00:04)

## 2018-06-19 DIAGNOSIS — M5134 Other intervertebral disc degeneration, thoracic region: Secondary | ICD-10-CM | POA: Diagnosis not present

## 2018-06-19 DIAGNOSIS — M9902 Segmental and somatic dysfunction of thoracic region: Secondary | ICD-10-CM | POA: Diagnosis not present

## 2018-06-21 ENCOUNTER — Encounter (INDEPENDENT_AMBULATORY_CARE_PROVIDER_SITE_OTHER): Payer: Self-pay | Admitting: Orthopaedic Surgery

## 2018-06-21 ENCOUNTER — Ambulatory Visit (INDEPENDENT_AMBULATORY_CARE_PROVIDER_SITE_OTHER): Payer: Medicare Other | Admitting: Orthopaedic Surgery

## 2018-06-21 DIAGNOSIS — M48061 Spinal stenosis, lumbar region without neurogenic claudication: Secondary | ICD-10-CM | POA: Diagnosis not present

## 2018-06-21 DIAGNOSIS — M5431 Sciatica, right side: Secondary | ICD-10-CM | POA: Diagnosis not present

## 2018-06-21 DIAGNOSIS — M1711 Unilateral primary osteoarthritis, right knee: Secondary | ICD-10-CM | POA: Diagnosis not present

## 2018-06-21 NOTE — Progress Notes (Signed)
The patient is following up 2 weeks after he was seen in the office with right hip and right knee issues.  It was felt that he potentially had a component of trochanteric bursitis in his right hip but he has known osteoarthritis of his right knee with bone-on-bone wear the medial compartment and varus malalignment.  He ambulates with a cane.  He is 85 years old.  The steroid injection in his knee helped just a little bit.  He still having hip pain.  However he points to his low back as a source of his pain and not so much the lateral aspect of his right hip today.  On exam I can move his hip through internal and external rotation easily with no pain in the groin at all.  There is only mild pain to palpation over the trochanteric region.  Most of his pain seems to be sciatic related.  It is radiating down behind his knee and in his thigh and hamstring area down to his foot.  He does still report significant right knee pain.  I reviewed the x-rays of his knee and hip since I did not see him at the last visit.  His knee x-rays show tricompartment arthritis with complete loss the medial joint space and varus malalignment.  His hip x-rays are normal.  However he could see the last 2 levels of his lumbar spine it does show significant degenerative disc disease at those levels.  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  He has had an epidural in the past elsewhere.  At this point I do feel he would benefit from a knee replacement.  He is on Plavix and he would have to be off this for a week prior to surgery.  He would benefit first though from an intervention in his lumbar spine which I feel would likely need to be an injection at the right at the L4-L5 level.  Based on his MRI from 2018 this seems to be appropriate and based on his clinical exam.  We will work on getting an  injection scheduled in his lumbar spine by Dr. Ernestina Patches hopefully in the next week or so.  I will see him back myself in 4 weeks to see how that he is done from this.

## 2018-06-21 NOTE — Addendum Note (Signed)
Addended by: Marlyne Beards on: 06/21/2018 04:35 PM   Modules accepted: Orders

## 2018-06-24 ENCOUNTER — Inpatient Hospital Stay (HOSPITAL_COMMUNITY)
Admission: EM | Admit: 2018-06-24 | Discharge: 2018-06-27 | DRG: 378 | Disposition: A | Discharge status: Home Health | Payer: Medicare Other | Attending: Internal Medicine | Admitting: Internal Medicine

## 2018-06-24 ENCOUNTER — Other Ambulatory Visit: Payer: Self-pay

## 2018-06-24 ENCOUNTER — Encounter (HOSPITAL_COMMUNITY): Payer: Self-pay | Admitting: *Deleted

## 2018-06-24 DIAGNOSIS — N183 Chronic kidney disease, stage 3 unspecified: Secondary | ICD-10-CM | POA: Diagnosis present

## 2018-06-24 DIAGNOSIS — E119 Type 2 diabetes mellitus without complications: Secondary | ICD-10-CM | POA: Diagnosis not present

## 2018-06-24 DIAGNOSIS — E538 Deficiency of other specified B group vitamins: Secondary | ICD-10-CM | POA: Diagnosis present

## 2018-06-24 DIAGNOSIS — Z833 Family history of diabetes mellitus: Secondary | ICD-10-CM

## 2018-06-24 DIAGNOSIS — Z6825 Body mass index (BMI) 25.0-25.9, adult: Secondary | ICD-10-CM

## 2018-06-24 DIAGNOSIS — Z87891 Personal history of nicotine dependence: Secondary | ICD-10-CM

## 2018-06-24 DIAGNOSIS — Z951 Presence of aortocoronary bypass graft: Secondary | ICD-10-CM

## 2018-06-24 DIAGNOSIS — K219 Gastro-esophageal reflux disease without esophagitis: Secondary | ICD-10-CM | POA: Diagnosis not present

## 2018-06-24 DIAGNOSIS — Z7982 Long term (current) use of aspirin: Secondary | ICD-10-CM

## 2018-06-24 DIAGNOSIS — K922 Gastrointestinal hemorrhage, unspecified: Secondary | ICD-10-CM | POA: Diagnosis not present

## 2018-06-24 DIAGNOSIS — R634 Abnormal weight loss: Secondary | ICD-10-CM | POA: Diagnosis present

## 2018-06-24 DIAGNOSIS — I25110 Atherosclerotic heart disease of native coronary artery with unstable angina pectoris: Secondary | ICD-10-CM | POA: Diagnosis not present

## 2018-06-24 DIAGNOSIS — I129 Hypertensive chronic kidney disease with stage 1 through stage 4 chronic kidney disease, or unspecified chronic kidney disease: Secondary | ICD-10-CM | POA: Diagnosis present

## 2018-06-24 DIAGNOSIS — Z8673 Personal history of transient ischemic attack (TIA), and cerebral infarction without residual deficits: Secondary | ICD-10-CM

## 2018-06-24 DIAGNOSIS — M25569 Pain in unspecified knee: Secondary | ICD-10-CM | POA: Diagnosis not present

## 2018-06-24 DIAGNOSIS — Z955 Presence of coronary angioplasty implant and graft: Secondary | ICD-10-CM | POA: Diagnosis not present

## 2018-06-24 DIAGNOSIS — Z79891 Long term (current) use of opiate analgesic: Secondary | ICD-10-CM | POA: Diagnosis not present

## 2018-06-24 DIAGNOSIS — Z86010 Personal history of colonic polyps: Secondary | ICD-10-CM

## 2018-06-24 DIAGNOSIS — R12 Heartburn: Secondary | ICD-10-CM

## 2018-06-24 DIAGNOSIS — E1122 Type 2 diabetes mellitus with diabetic chronic kidney disease: Secondary | ICD-10-CM | POA: Diagnosis not present

## 2018-06-24 DIAGNOSIS — E7800 Pure hypercholesterolemia, unspecified: Secondary | ICD-10-CM | POA: Diagnosis present

## 2018-06-24 DIAGNOSIS — K589 Irritable bowel syndrome without diarrhea: Secondary | ICD-10-CM | POA: Diagnosis not present

## 2018-06-24 DIAGNOSIS — R531 Weakness: Secondary | ICD-10-CM | POA: Diagnosis not present

## 2018-06-24 DIAGNOSIS — K269 Duodenal ulcer, unspecified as acute or chronic, without hemorrhage or perforation: Secondary | ICD-10-CM | POA: Diagnosis present

## 2018-06-24 DIAGNOSIS — I2510 Atherosclerotic heart disease of native coronary artery without angina pectoris: Secondary | ICD-10-CM | POA: Diagnosis not present

## 2018-06-24 DIAGNOSIS — I714 Abdominal aortic aneurysm, without rupture: Secondary | ICD-10-CM | POA: Diagnosis present

## 2018-06-24 DIAGNOSIS — Z888 Allergy status to other drugs, medicaments and biological substances status: Secondary | ICD-10-CM | POA: Diagnosis not present

## 2018-06-24 DIAGNOSIS — Z79899 Other long term (current) drug therapy: Secondary | ICD-10-CM

## 2018-06-24 DIAGNOSIS — Z8249 Family history of ischemic heart disease and other diseases of the circulatory system: Secondary | ICD-10-CM

## 2018-06-24 DIAGNOSIS — D62 Acute posthemorrhagic anemia: Secondary | ICD-10-CM | POA: Diagnosis not present

## 2018-06-24 DIAGNOSIS — I959 Hypotension, unspecified: Secondary | ICD-10-CM | POA: Diagnosis not present

## 2018-06-24 DIAGNOSIS — D696 Thrombocytopenia, unspecified: Secondary | ICD-10-CM | POA: Diagnosis not present

## 2018-06-24 DIAGNOSIS — Z800 Family history of malignant neoplasm of digestive organs: Secondary | ICD-10-CM

## 2018-06-24 DIAGNOSIS — K319 Disease of stomach and duodenum, unspecified: Secondary | ICD-10-CM | POA: Diagnosis present

## 2018-06-24 DIAGNOSIS — K254 Chronic or unspecified gastric ulcer with hemorrhage: Secondary | ICD-10-CM | POA: Diagnosis not present

## 2018-06-24 DIAGNOSIS — R1084 Generalized abdominal pain: Secondary | ICD-10-CM | POA: Diagnosis not present

## 2018-06-24 DIAGNOSIS — E876 Hypokalemia: Secondary | ICD-10-CM | POA: Diagnosis not present

## 2018-06-24 DIAGNOSIS — Z7902 Long term (current) use of antithrombotics/antiplatelets: Secondary | ICD-10-CM

## 2018-06-24 DIAGNOSIS — R100 Acute abdomen: Secondary | ICD-10-CM | POA: Diagnosis not present

## 2018-06-24 DIAGNOSIS — R42 Dizziness and giddiness: Secondary | ICD-10-CM | POA: Diagnosis not present

## 2018-06-24 DIAGNOSIS — R1310 Dysphagia, unspecified: Secondary | ICD-10-CM | POA: Diagnosis present

## 2018-06-24 DIAGNOSIS — K3189 Other diseases of stomach and duodenum: Secondary | ICD-10-CM | POA: Diagnosis not present

## 2018-06-24 DIAGNOSIS — K921 Melena: Secondary | ICD-10-CM | POA: Diagnosis not present

## 2018-06-24 DIAGNOSIS — Z8371 Family history of colonic polyps: Secondary | ICD-10-CM

## 2018-06-24 HISTORY — DX: Gastrointestinal hemorrhage, unspecified: K92.2

## 2018-06-24 LAB — CBC WITH DIFFERENTIAL/PLATELET
Abs Immature Granulocytes: 0.09 10*3/uL — ABNORMAL HIGH (ref 0.00–0.07)
Basophils Absolute: 0 10*3/uL (ref 0.0–0.1)
Basophils Relative: 0 %
Eosinophils Absolute: 0.1 10*3/uL (ref 0.0–0.5)
Eosinophils Relative: 1 %
HCT: 30.2 % — ABNORMAL LOW (ref 39.0–52.0)
Hemoglobin: 9.3 g/dL — ABNORMAL LOW (ref 13.0–17.0)
Immature Granulocytes: 1 %
Lymphocytes Relative: 15 %
Lymphs Abs: 1.6 10*3/uL (ref 0.7–4.0)
MCH: 32.3 pg (ref 26.0–34.0)
MCHC: 30.8 g/dL (ref 30.0–36.0)
MCV: 104.9 fL — ABNORMAL HIGH (ref 80.0–100.0)
Monocytes Absolute: 0.6 10*3/uL (ref 0.1–1.0)
Monocytes Relative: 6 %
Neutro Abs: 7.9 10*3/uL — ABNORMAL HIGH (ref 1.7–7.7)
Neutrophils Relative %: 77 %
Platelets: 240 10*3/uL (ref 150–400)
RBC: 2.88 MIL/uL — ABNORMAL LOW (ref 4.22–5.81)
RDW: 13.6 % (ref 11.5–15.5)
WBC: 10.3 10*3/uL (ref 4.0–10.5)
nRBC: 0 % (ref 0.0–0.2)

## 2018-06-24 LAB — POCT I-STAT EG7
Acid-Base Excess: 3 mmol/L — ABNORMAL HIGH (ref 0.0–2.0)
Bicarbonate: 29.7 mmol/L — ABNORMAL HIGH (ref 20.0–28.0)
Calcium, Ion: 1.14 mmol/L — ABNORMAL LOW (ref 1.15–1.40)
HCT: 25 % — ABNORMAL LOW (ref 39.0–52.0)
Hemoglobin: 8.5 g/dL — ABNORMAL LOW (ref 13.0–17.0)
O2 Saturation: 72 %
Potassium: 3.6 mmol/L (ref 3.5–5.1)
Sodium: 143 mmol/L (ref 135–145)
TCO2: 31 mmol/L (ref 22–32)
pCO2, Ven: 54.1 mmHg (ref 44.0–60.0)
pH, Ven: 7.348 (ref 7.250–7.430)
pO2, Ven: 41 mmHg (ref 32.0–45.0)

## 2018-06-24 LAB — CBC
HCT: 27.6 % — ABNORMAL LOW (ref 39.0–52.0)
HCT: 28.8 % — ABNORMAL LOW (ref 39.0–52.0)
Hemoglobin: 9.2 g/dL — ABNORMAL LOW (ref 13.0–17.0)
Hemoglobin: 9.1 g/dL — ABNORMAL LOW (ref 13.0–17.0)
MCH: 33.1 pg (ref 26.0–34.0)
MCH: 31.5 pg (ref 26.0–34.0)
MCHC: 33.3 g/dL (ref 30.0–36.0)
MCHC: 31.6 g/dL (ref 30.0–36.0)
MCV: 99.3 fL (ref 80.0–100.0)
MCV: 99.7 fL (ref 80.0–100.0)
Platelets: 228 10*3/uL (ref 150–400)
Platelets: 236 10*3/uL (ref 150–400)
RBC: 2.78 MIL/uL — ABNORMAL LOW (ref 4.22–5.81)
RBC: 2.89 MIL/uL — ABNORMAL LOW (ref 4.22–5.81)
RDW: 13.6 % (ref 11.5–15.5)
RDW: 13.6 % (ref 11.5–15.5)
WBC: 10.2 10*3/uL (ref 4.0–10.5)
WBC: 10.4 10*3/uL (ref 4.0–10.5)
nRBC: 0 % (ref 0.0–0.2)
nRBC: 0 % (ref 0.0–0.2)

## 2018-06-24 LAB — COMPREHENSIVE METABOLIC PANEL
ALT: 9 U/L (ref 0–44)
AST: 17 U/L (ref 15–41)
Albumin: 3 g/dL — ABNORMAL LOW (ref 3.5–5.0)
Alkaline Phosphatase: 46 U/L (ref 38–126)
Anion gap: 10 (ref 5–15)
BUN: 51 mg/dL — ABNORMAL HIGH (ref 8–23)
CO2: 26 mmol/L (ref 22–32)
Calcium: 8.4 mg/dL — ABNORMAL LOW (ref 8.9–10.3)
Chloride: 106 mmol/L (ref 98–111)
Creatinine, Ser: 1.41 mg/dL — ABNORMAL HIGH (ref 0.61–1.24)
GFR calc Af Amer: 53 mL/min — ABNORMAL LOW (ref >60)
GFR calc non Af Amer: 46 mL/min — ABNORMAL LOW (ref >60)
Glucose, Bld: 105 mg/dL — ABNORMAL HIGH (ref 70–99)
Potassium: 3.6 mmol/L (ref 3.5–5.1)
Sodium: 142 mmol/L (ref 135–145)
Total Bilirubin: 0.2 mg/dL — ABNORMAL LOW (ref 0.3–1.2)
Total Protein: 5.3 g/dL — ABNORMAL LOW (ref 6.5–8.1)

## 2018-06-24 LAB — POC OCCULT BLOOD, ED: Fecal Occult Bld: POSITIVE — AB

## 2018-06-24 LAB — I-STAT CREATININE, ED: Creatinine, Ser: 1.4 mg/dL — ABNORMAL HIGH (ref 0.61–1.24)

## 2018-06-24 LAB — GLUCOSE, CAPILLARY
Glucose-Capillary: 102 mg/dL — ABNORMAL HIGH (ref 70–99)
Glucose-Capillary: 203 mg/dL — ABNORMAL HIGH (ref 70–99)

## 2018-06-24 LAB — MRSA PCR SCREENING: MRSA by PCR: NEGATIVE

## 2018-06-24 MED ORDER — SODIUM CHLORIDE 0.9 % IV BOLUS (SEPSIS)
1000.0000 mL | Freq: Once | INTRAVENOUS | Status: AC
  Administered 2018-06-24: 1000 mL via INTRAVENOUS

## 2018-06-24 MED ORDER — DICLOFENAC SODIUM 1 % TD GEL
2.0000 g | Freq: Four times a day (QID) | TRANSDERMAL | Status: DC
  Administered 2018-06-24 – 2018-06-26 (×5): 2 g via TOPICAL
  Filled 2018-06-24: qty 100

## 2018-06-24 MED ORDER — SODIUM CHLORIDE 0.9 % IV SOLN
INTRAVENOUS | Status: DC
  Administered 2018-06-24 – 2018-06-25 (×2): via INTRAVENOUS
  Administered 2018-06-27: 100 mL/h via INTRAVENOUS

## 2018-06-24 MED ORDER — INSULIN ASPART 100 UNIT/ML ~~LOC~~ SOLN
0.0000 [IU] | Freq: Three times a day (TID) | SUBCUTANEOUS | Status: DC
  Administered 2018-06-25 – 2018-06-26 (×4): 2 [IU] via SUBCUTANEOUS
  Administered 2018-06-27: 3 [IU] via SUBCUTANEOUS

## 2018-06-24 MED ORDER — SODIUM CHLORIDE 0.9% FLUSH: 10.0000 mL | INTRAVENOUS | Status: DC | PRN

## 2018-06-24 MED ORDER — SODIUM CHLORIDE 0.9 % IV SOLN
80.0000 mg | Freq: Once | INTRAVENOUS | Status: AC
  Administered 2018-06-24: 80 mg via INTRAVENOUS
  Filled 2018-06-24: qty 80

## 2018-06-24 MED ORDER — IRBESARTAN 150 MG PO TABS: 150.0000 mg | ORAL_TABLET | Freq: Every day | ORAL | Status: DC

## 2018-06-24 MED ORDER — BOOST / RESOURCE BREEZE PO LIQD CUSTOM
1.0000 | Freq: Three times a day (TID) | ORAL | Status: DC
  Administered 2018-06-24 – 2018-06-25 (×2): 1 via ORAL

## 2018-06-24 MED ORDER — SODIUM CHLORIDE 0.9 % IV SOLN
8.0000 mg/h | INTRAVENOUS | Status: DC
  Administered 2018-06-24 – 2018-06-27 (×5): 8 mg/h via INTRAVENOUS
  Filled 2018-06-24 (×9): qty 80

## 2018-06-24 MED ORDER — ONDANSETRON HCL 4 MG/2ML IJ SOLN: 4.0000 mg | Freq: Four times a day (QID) | INTRAMUSCULAR | Status: DC | PRN

## 2018-06-24 MED ORDER — FLUDROCORTISONE ACETATE 0.1 MG PO TABS
0.1000 mg | ORAL_TABLET | Freq: Two times a day (BID) | ORAL | Status: DC
  Administered 2018-06-24 – 2018-06-27 (×5): 0.1 mg via ORAL
  Filled 2018-06-24 (×7): qty 1

## 2018-06-24 MED ORDER — INSULIN ASPART 100 UNIT/ML ~~LOC~~ SOLN
0.0000 [IU] | Freq: Every day | SUBCUTANEOUS | Status: DC
  Administered 2018-06-25: 2 [IU] via SUBCUTANEOUS

## 2018-06-24 MED ORDER — FLUDROCORTISONE ACETATE 0.1 MG PO TABS: 0.1000 mg | ORAL_TABLET | Freq: Every day | ORAL | Status: DC

## 2018-06-24 MED ORDER — ONDANSETRON HCL 4 MG PO TABS: 4.0000 mg | ORAL_TABLET | Freq: Four times a day (QID) | ORAL | Status: DC | PRN

## 2018-06-24 MED ORDER — GABAPENTIN 100 MG PO CAPS
200.0000 mg | ORAL_CAPSULE | Freq: Two times a day (BID) | ORAL | Status: DC
  Administered 2018-06-24 – 2018-06-27 (×5): 200 mg via ORAL
  Filled 2018-06-24 (×5): qty 2

## 2018-06-24 MED ORDER — TRAMADOL HCL 50 MG PO TABS
50.0000 mg | ORAL_TABLET | Freq: Four times a day (QID) | ORAL | Status: DC | PRN
  Administered 2018-06-24 – 2018-06-26 (×5): 50 mg via ORAL
  Filled 2018-06-24 (×6): qty 1

## 2018-06-24 NOTE — H&P (Signed)
History and Physical  [name] [name] DOB: [birth year] DOA: 06/24/2018  Referring physician: Dr Dewayne Hatch, ED physician PCP: [name], MD  Outpatient Specialists:   Patient Coming From: home  Chief Complaint: black stool  HPI: [name] is a 85 y.o. male with a history of hypertension, coronary artery disease status post stenting to the LAD in 2009 and RCA in 2011.  Patient is on Plavix and aspirin.  He also has diabetes, GERD, IBS.  Patient presents with 2 days of worsening melena, acutely worse this morning.  No palliating or provoking factors.  He describes some mild discomfort in his right upper quadrant that is intermittent and worse with palpation.  No improvement with food or liquid.  His appetite has been normal. He does describe a 15 pound weight loss in the past 3 to 4 months.  Emergency Department Course: Type and screen.  Hemoglobin 9.  Hemoccult positive.  Review of Systems:   Pt denies any fevers, chills, nausea, vomiting, diarrhea, constipation, abdominal pain, shortness of breath, dyspnea on exertion, orthopnea, cough, wheezing, palpitations, headache, vision changes, lightheadedness, dizziness, rectal bleeding.  Review of systems are otherwise negative  Past Medical History:  Diagnosis Date  . 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 Colon polyps   . Coronary atherosclerosis of unspecified type of vessel, native or graft   . Diabetes ([place])   . Difficult intubation   . Esophageal reflux   . High cholesterol   . History of IBS 02/27/2009  . Hypertension    pt denies, he says he has a h/o hypotension. If BP up he adjusts the Florinef  . Orthostatic hypotension    "BP has been dropping alot when I stand up for the last month or so" (02/17/2016)  . Vitamin B 12 deficiency   . Vitamin D deficiency    Past Surgical History:  Procedure Laterality Date  . BLADDER SURGERY  1969   traumatic pelvic fractures, urethral and bladder repair  . CARDIAC  CATHETERIZATION N/A 07/01/2015   Procedure: Left Heart Cath and Coronary Angiography;  Surgeon: Wellington Hampshire, MD;  Location: [facility];  Service: Cardiovascular;  Laterality: N/A;  . COLON RESECTION N/A 05/17/2017   Procedure: DIAGNOSTIC LAPAROSCOPY,;  Surgeon: Leighton Ruff, MD;  Location: [facility];  Service: General;  Laterality: N/A;  . CORONARY ANGIOPLASTY WITH STENT PLACEMENT    . CORONARY ARTERY BYPASS GRAFT N/A 07/06/2015   Procedure: CORONARY ARTERY BYPASS GRAFTING (CABG)x 4   utilizing the left internal mammary artery and endoscopically harvested bilateral  sapheneous vein.;  Surgeon: Ivin Poot, MD;  Location: [facility];  Service: Open Heart Surgery;  Laterality: N/A;  . KNEE SURGERY    . LOOP RECORDER INSERTION N/A 02/19/2018   Procedure: LOOP RECORDER INSERTION;  Surgeon: Deboraha Sprang, MD;  Location: [facility];  Service: Cardiovascular;  Laterality: N/A;  . TEE WITHOUT CARDIOVERSION N/A 07/06/2015   Procedure: TRANSESOPHAGEAL ECHOCARDIOGRAM (TEE);  Surgeon: Ivin Poot, MD;  Location: [facility];  Service: Open Heart Surgery;  Laterality: N/A;   Social History:  reports that he quit smoking about 54 years ago. He quit smokeless tobacco use about 31 years ago.  His smokeless tobacco use included chew. He reports that he does not drink alcohol or use drugs. Patient lives at home  Allergies  Allergen Reactions  . Cymbalta [Duloxetine Hcl]     Dizziness  . Keflex [Cephalexin] Other (See Comments)    Reaction: unknown   .  Simvastatin Other (See Comments)    Reaction: unknown   . Sudafed [Pseudoephedrine]     Dizziness    Family History  Problem Relation Age of Onset  . Heart attack Father        died age 11  . Heart attack Brother        died age 39  . Anuerysm Brother        aortic  . Heart attack Sister        died age 25  . Colon cancer Sister   . Liver cancer Sister   . Diabetes Maternal Grandmother   . Colon polyps Sister        and brothers x 2        Prior to Admission medications   Medication Sig Start Date End Date Taking? Authorizing Provider  acetaminophen (TYLENOL) 500 MG tablet Take 1,000 mg by mouth as needed for mild pain.    [provider]  aspirin 81 MG EC tablet TAKE 1 TABLET BY MOUTH EVERY DAY 05/11/18   Venancio Poisson, NP  Blood Glucose Monitoring Suppl (ONE TOUCH ULTRA 2) w/Device KIT Check blood sugar 1 time daily-DX-E11.9. 04/30/18   [name], MD  butalbital-acetaminophen-caffeine (FIORICET, ESGIC) [phone] MG tablet Take 1 tablet every 4 hours ONLY if needed for severe Headache 04/27/18   [name], MD  CALCIUM PO Take 1 tablet by mouth daily.    [provider]  Cholecalciferol (VITAMIN D-3) 5000 units TABS Take 5,000 Units by mouth daily.    [provider]  clopidogrel (PLAVIX) 75 MG tablet Take 1 tablet (75 mg total) by mouth daily. Patient taking differently: Take 75 mg by mouth at bedtime.  01/24/18 01/24/19  Vicie Mutters, PA-C  diclofenac sodium (VOLTAREN) 1 % GEL APPLY 2 GRAMS TOPICALLY TO AFFECTED AREA 4 TIMES DAILY 05/07/18   Meredith Staggers, MD  fludrocortisone (FLORINEF) 0.1 MG tablet Take 0.1 mg by mouth daily.     [provider]  gabapentin (NEURONTIN) 100 MG capsule TAKE 2 CAPSULE(200 MG) BY MOUTH 2 TIMES A DAY. Patient taking differently: Take 200 mg by mouth 2 (two) times daily. TAKE 2 CAPSULE(200 MG) BY MOUTH 2 TIMES A DAY. 10/06/17   [name], MD  glucose blood test strip Check blood sugar 1 time daily-DX-E11.9 07/07/17   [name], MD  Multiple Vitamin (MULTIVITAMIN WITH MINERALS) TABS tablet Take 1 tablet by mouth daily.    [provider]  olmesartan (BENICAR) 40 MG tablet TAKE 1/2 TO 1 TABLET BY MOUTH EVERY DAY FOR BLOOD PRESSURE 06/17/18   [name], MD  polyethylene glycol [facility] / Floria Raveling) packet Take 17 g by mouth daily. Patient taking differently: Take 17 g by mouth 2 (two) times daily.  05/22/17   Barton Dubois, MD  traMADol (ULTRAM) 50 MG tablet Take 1 tab up to every 6 hours as needed for severe pain. 06/18/18   Liane Comber, NP    Physical Exam: BP 100/64 (BP Location: Right Arm)   Pulse 91   Temp 98.8 F (37.1 C) (Oral)   Resp 12   Ht 6' (1.829 m)   Wt 83.9 kg   SpO2 98%   BMI 25.09 kg/m   . General: Elderly Caucasian male. Awake and alert and oriented x3. No acute cardiopulmonary distress.  Marland Kitchen HEENT: Normocephalic atraumatic.  Right and left ears normal in appearance.  Pupils equal, round, reactive to light. Extraocular muscles are intact. Sclerae anicteric and noninjected.  Moist mucosal membranes.  No mucosal lesions.  . Neck: Neck supple without lymphadenopathy. No carotid bruits. No masses palpated.  . Cardiovascular: Regular rate with normal S1-S2 sounds. No murmurs, rubs, gallops auscultated. No JVD.  Marland Kitchen Respiratory: Good respiratory effort with no wheezes, rales, rhonchi. Lungs clear to auscultation bilaterally.  No accessory muscle use. . Abdomen: Soft, mildly tender in the right upper quadrant without rebound or guarding, nondistended. Active bowel sounds. No masses or hepatosplenomegaly  . Skin: No rashes, lesions, or ulcerations.  Dry, warm to touch. 2+ dorsalis pedis and radial pulses. . Musculoskeletal: No calf or leg pain. All major joints not erythematous nontender.  No upper or lower joint deformation.  Good ROM.  No contractures  . Psychiatric: Intact judgment and insight. Pleasant and cooperative. . Neurologic: No focal neurological deficits. Strength is 5/5 and symmetric in upper and lower extremities.  Cranial nerves II through XII are grossly intact.           Labs on Admission: I have personally reviewed following labs and imaging studies  CBC: Recent Labs  Lab 06/24/18 1141 06/24/18 1148  WBC 10.3  --   NEUTROABS 7.9*  --   HGB 9.3* 8.5*  HCT 30.2* 25.0*  MCV 104.9*  --   PLT 240  --    Basic Metabolic Panel: Recent Labs  Lab 06/24/18 1148  06/24/18 1149  NA 143  --   K 3.6  --   CREATININE  --  1.40*   GFR: Estimated Creatinine Clearance: 43.9 mL/min (A) (by C-G formula based on SCr of 1.4 mg/dL (H)). Liver Function Tests: No results for input(s): AST, ALT, ALKPHOS, BILITOT, PROT, ALBUMIN in the last 168 hours. No results for input(s): LIPASE, AMYLASE in the last 168 hours. No results for input(s): AMMONIA in the last 168 hours. Coagulation Profile: No results for input(s): INR, PROTIME in the last 168 hours. Cardiac Enzymes: No results for input(s): CKTOTAL, CKMB, CKMBINDEX, TROPONINI in the last 168 hours. BNP (last 3 results) No results for input(s): PROBNP in the last 8760 hours. HbA1C: No results for input(s): HGBA1C in the last 72 hours. CBG: No results for input(s): GLUCAP in the last 168 hours. Lipid Profile: No results for input(s): CHOL, HDL, LDLCALC, TRIG, CHOLHDL, LDLDIRECT in the last 72 hours. Thyroid Function Tests: No results for input(s): TSH, T4TOTAL, FREET4, T3FREE, THYROIDAB in the last 72 hours. Anemia Panel: No results for input(s): VITAMINB12, FOLATE, FERRITIN, TIBC, IRON, RETICCTPCT in the last 72 hours. Urine analysis:    Component Value Date/Time   COLORURINE YELLOW 01/30/2018 [place] 01/30/2018 1454   LABSPEC 1.021 01/30/2018 1454   PHURINE 5.5 01/30/2018 1454   GLUCOSEU NEGATIVE 01/30/2018 1454   HGBUR NEGATIVE 01/30/2018 1454   BILIRUBINUR NEGATIVE 05/17/2017 0748   KETONESUR NEGATIVE 01/30/2018 1454   PROTEINUR NEGATIVE 01/30/2018 1454   UROBILINOGEN 1.0 01/05/2015 1550   NITRITE NEGATIVE 01/30/2018 1454   LEUKOCYTESUR NEGATIVE 01/30/2018 1454   Sepsis Labs: _0 (procalcitonin:4,lacticidven:4) )No results found for this or any previous visit (from the past 240 hour(s)).   Radiological Exams on Admission: No results found.   Assessment/Plan: Principal Problem:   Acute blood loss anemia Active Problems:   Coronary atherosclerosis- s/p PCI to LAD  in 2009 and PCI to RCA in 2011   GERD   S/P CABG x 4   Diabetes mellitus type 2, diet-controlled ([place])   CKD stage 3 due to type 2 diabetes mellitus ([place])   Upper GI bleed    This  patient was discussed with the ED physician, including pertinent vitals, physical exam findings, labs, and imaging.  We also discussed care given by the ED provider.  1. Acute blood loss anemia a. Admit b. T&S pending c. Rpt CBC every 8 hours 2. Upper GI bleed a. Protonix drip b. Clear liquid diet 3. CKD stage III a. Retinae at baseline 4. Diabetes type 2 a. Diet controlled b. Sliding scale insulin 5. Coronary artery disease status post CABG x4 vessels a. Holding ASA and plavix 6. GERD a. On protonix  DVT prophylaxis: SCDs Consultants: GI Code Status: Full Family Communication: brother and wife present  Disposition Plan: home following evaluation   Truett Mainland, DO

## 2018-06-24 NOTE — Plan of Care (Signed)
  Problem: Pain Managment: Goal: General experience of comfort will improve Outcome: Progressing   Problem: Education: Goal: Ability to identify signs and symptoms of gastrointestinal bleeding will improve Outcome: Progressing   Problem: Fluid Volume: Goal: Will show no signs and symptoms of excessive bleeding Outcome: Progressing   Problem: Clinical Measurements: Goal: Complications related to the disease process, condition or treatment will be avoided or minimized Outcome: Progressing

## 2018-06-24 NOTE — ED Provider Notes (Signed)
[facility] EM[facility]GENCY DEPARTMENT Provider Note   CSN: [id number] Arrival date & time: 06/24/18  1036    History   Chief Complaint Chief Complaint  Patient presents with  . GI Problem    HPI [name] is a 85 y.o. male.     Patient states that he has been having black stools since yesterday.  Patient is on aspirin and Plavix  The history is provided by the patient. No language interpreter was used.  GI Problem  This is a new problem. The current episode started 2 days ago. The problem occurs constantly. The problem has not changed since onset.Pertinent negatives include no chest pain, no abdominal pain and no headaches. Nothing aggravates the symptoms. Nothing relieves the symptoms. He has tried nothing for the symptoms. The treatment provided no relief.    Past Medical History:  Diagnosis Date  . 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 Colon polyps   . Coronary atherosclerosis of unspecified type of vessel, native or graft   . Diabetes ([place])   . Difficult intubation   . Esophageal reflux   . High cholesterol   . History of IBS 02/27/2009  . Hypertension    pt denies, he says he has a h/o hypotension. If BP up he adjusts the Florinef  . Orthostatic hypotension    "BP has been dropping alot when I stand up for the last month or so" (02/17/2016)  . Vitamin B 12 deficiency   . Vitamin D deficiency     Patient Active Problem List   Diagnosis Date Noted  . Acute blood loss anemia 06/24/2018  . Upper GI bleed 06/24/2018  . History of lacunar cerebrovascular accident (CVA) 02/12/2018  . Dizziness 02/12/2018  . Myofascial pain 12/18/2017  . Spondylosis without myelopathy or radiculopathy, lumbar region 12/18/2017  . Lumbar radiculopathy, right 12/18/2017  . Cholelithiasis 05/17/2017  . Sinus Bradycardia 05/17/2017  . Thoracic radiculopathy 04/12/2017  . Primary osteoarthritis of right knee 12/14/2016  . DDD (degenerative disc disease), lumbar  10/10/2016  . Lumbar facet arthropathy 08/29/2016  . Idiopathic scoliosis 03/22/2016  . Diabetic neuropathy ([place]) 03/22/2016  . Dysautonomia orthostatic hypotension syndrome ([place]) 02/25/2016  . CKD stage 3 due to type 2 diabetes mellitus ([place]) 02/19/2016  . Coronary artery disease involving coronary bypass graft of native heart without angina pectoris   . Diabetes mellitus type 2, diet-controlled ([place])   . 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 PVD (peripheral vascular disease) ([place]) 01/01/2014  . Hyperlipidemia associated with type 2 diabetes mellitus ([place]) 04/11/2013  . Supine hypertension   . Vitamin D deficiency   . Vitamin B 12 deficiency   . Coronary atherosclerosis- s/p PCI to LAD in 2009 and PCI to RCA in 2011 02/27/2009  . Abdominal aortic aneurysm ([place]) 02/27/2009  . GERD 02/27/2009    Past Surgical History:  Procedure Laterality Date  . BLADDER SURGERY  1969   traumatic pelvic fractures, urethral and bladder repair  . CARDIAC CATHETERIZATION N/A 07/01/2015   Procedure: Left Heart Cath and Coronary Angiography;  Surgeon: Wellington Hampshire, MD;  Location: [facility];  Service: Cardiovascular;  Laterality: N/A;  . COLON RESECTION N/A 05/17/2017   Procedure: DIAGNOSTIC LAPAROSCOPY,;  Surgeon: Leighton Ruff, MD;  Location: [facility];  Service: General;  Laterality: N/A;  . CORONARY ANGIOPLASTY WITH STENT PLACEMENT    . CORONARY ARTERY BYPASS GRAFT N/A 07/06/2015   Procedure: CORONARY ARTERY BYPASS GRAFTING (CABG)x  4   utilizing the left internal mammary artery and endoscopically harvested bilateral  sapheneous vein.;  Surgeon: Ivin Poot, MD;  Location: [facility];  Service: Open Heart Surgery;  Laterality: N/A;  . KNEE SURGERY    . LOOP RECORDER INSERTION N/A 02/19/2018   Procedure: LOOP RECORDER INSERTION;  Surgeon: Deboraha Sprang, MD;  Location: [facility];  Service: Cardiovascular;  Laterality: N/A;  . TEE WITHOUT CARDIOVERSION N/A 07/06/2015    Procedure: TRANSESOPHAGEAL ECHOCARDIOGRAM (TEE);  Surgeon: Ivin Poot, MD;  Location: [facility];  Service: Open Heart Surgery;  Laterality: N/A;        Home Medications    Prior to Admission medications   Medication Sig Start Date End Date Taking? Authorizing Provider  aspirin 81 MG EC tablet TAKE 1 TABLET BY MOUTH EVERY DAY Patient taking differently: Take 81 mg by mouth daily.  05/11/18  Yes Venancio Poisson, NP  clopidogrel (PLAVIX) 75 MG tablet Take 1 tablet (75 mg total) by mouth daily. Patient taking differently: Take 75 mg by mouth at bedtime.  01/24/18 01/24/19 Yes Vicie Mutters, PA-C  acetaminophen (TYLENOL) 500 MG tablet Take 1,000 mg by mouth as needed for mild pain.    [provider]  Blood Glucose Monitoring Suppl (ONE TOUCH ULTRA 2) w/Device KIT Check blood sugar 1 time daily-DX-E11.9. 04/30/18   [name], MD  butalbital-acetaminophen-caffeine (FIORICET, ESGIC) [phone] MG tablet Take 1 tablet every 4 hours ONLY if needed for severe Headache Patient taking differently: 1 tablet every 4 (four) hours as needed for headache.  04/27/18   [name], MD  CALCIUM PO Take 1 tablet by mouth daily.    [provider]  Cholecalciferol (VITAMIN D-3) 5000 units TABS Take 5,000 Units by mouth daily.    [provider]  diclofenac sodium (VOLTAREN) 1 % GEL APPLY 2 GRAMS TOPICALLY TO AFFECTED AREA 4 TIMES DAILY Patient taking differently: Apply 2 g topically 4 (four) times daily.  05/07/18   Meredith Staggers, MD  fludrocortisone (FLORINEF) 0.1 MG tablet Take 0.1 mg by mouth daily.     [provider]  gabapentin (NEURONTIN) 100 MG capsule TAKE 2 CAPSULE(200 MG) BY MOUTH 2 TIMES A DAY. Patient taking differently: Take 200 mg by mouth 2 (two) times daily. TAKE 2 CAPSULE(200 MG) BY MOUTH 2 TIMES A DAY. 10/06/17   [name], MD  glucose blood test strip Check blood sugar 1 time daily-DX-E11.9 07/07/17   [name], MD  Multiple  Vitamin (MULTIVITAMIN WITH MINERALS) TABS tablet Take 1 tablet by mouth daily.    [provider]  olmesartan (BENICAR) 40 MG tablet TAKE 1/2 TO 1 TABLET BY MOUTH EVERY DAY FOR BLOOD PRESSURE Patient taking differently: Take 20-40 mg by mouth daily.  06/17/18   [name], MD  polyethylene glycol [facility] / Floria Raveling) packet Take 17 g by mouth daily. Patient taking differently: Take 17 g by mouth 2 (two) times daily.  05/22/17   Barton Dubois, MD  traMADol (ULTRAM) 50 MG tablet Take 1 tab up to every 6 hours as needed for severe pain. 06/18/18   Liane Comber, NP    Family History Family History  Problem Relation Age of Onset  . Heart attack Father        died age 44  . Heart attack Brother        died age 1  . Anuerysm Brother        aortic  . Heart attack Sister  died age 38  . Colon cancer Sister   . Liver cancer Sister   . Diabetes Maternal Grandmother   . Colon polyps Sister        and brothers x 2     Social History Social History   Tobacco Use  . Smoking status: Former Smoker    Last attempt to quit: 07/16/1963    Years since quitting: 54.9  . Smokeless tobacco: Former Systems developer    Types: Chew    Quit date: 1989  Substance Use Topics  . Alcohol use: No  . Drug use: No     Allergies   Cymbalta [duloxetine hcl]; Keflex [cephalexin]; Simvastatin; and Sudafed [pseudoephedrine]   Review of Systems Review of Systems  Constitutional: Negative for appetite change and fatigue.  HENT: Negative for congestion, ear discharge and sinus pressure.   Eyes: Negative for discharge.  Respiratory: Negative for cough.   Cardiovascular: Negative for chest pain.  Gastrointestinal: Negative for abdominal pain and diarrhea.       GI bleed black stools  Genitourinary: Negative for frequency and hematuria.  Musculoskeletal: Negative for back pain.  Skin: Negative for rash.  Neurological: Negative for seizures and headaches.  Psychiatric/Behavioral: Negative for  hallucinations.     Physical Exam Updated Vital Signs BP 100/64 (BP Location: Right Arm)   Pulse 91   Temp 98.8 F (37.1 C) (Oral)   Resp 12   Ht 6' (1.829 m)   Wt 83.9 kg   SpO2 98%   BMI 25.09 kg/m   Physical Exam Vitals signs and nursing note reviewed.  Constitutional:      Appearance: He is well-developed.  HENT:     Head: Normocephalic.     Nose: Nose normal.  Eyes:     General: No scleral icterus.    Conjunctiva/sclera: Conjunctivae normal.  Neck:     Musculoskeletal: Neck supple.     Thyroid: No thyromegaly.  Cardiovascular:     Rate and Rhythm: Normal rate and regular rhythm.     Heart sounds: No murmur. No friction rub. No gallop.   Pulmonary:     Breath sounds: No stridor. No wheezing or rales.  Chest:     Chest wall: No tenderness.  Abdominal:     General: There is no distension.     Tenderness: There is no abdominal tenderness. There is no rebound.  Genitourinary:    Comments: Rectal exam shows black stools that are guaiac positive Musculoskeletal: Normal range of motion.  Lymphadenopathy:     Cervical: No cervical adenopathy.  Skin:    Findings: No erythema or rash.  Neurological:     Mental Status: He is oriented to person, place, and time.     Motor: No abnormal muscle tone.     Coordination: Coordination normal.  Psychiatric:        Behavior: Behavior normal.      ED Treatments / Results  Labs (all labs ordered are listed, but only abnormal results are displayed) Labs Reviewed  CBC WITH DIFFERENTIAL/PLATELET - Abnormal; Notable for the following components:      Result Value   RBC 2.88 (*)    Hemoglobin 9.3 (*)    HCT 30.2 (*)    MCV 104.9 (*)    Neutro Abs 7.9 (*)    Abs Immature Granulocytes 0.09 (*)    All other components within normal limits  COMPREHENSIVE METABOLIC PANEL - Abnormal; Notable for the following components:   Glucose, Bld 105 (*)    BUN  51 (*)    Creatinine, Ser 1.41 (*)    Calcium 8.4 (*)    Total Protein  5.3 (*)    Albumin 3.0 (*)    Total Bilirubin 0.2 (*)    GFR calc non Af Amer 46 (*)    GFR calc Af Amer 53 (*)    All other components within normal limits  I-STAT CREATININE, ED - Abnormal; Notable for the following components:   Creatinine, Ser 1.40 (*)    All other components within normal limits  POC OCCULT BLOOD, ED - Abnormal; Notable for the following components:   Fecal Occult Bld POSITIVE (*)    All other components within normal limits  POCT I-STAT EG7 - Abnormal; Notable for the following components:   Bicarbonate 29.7 (*)    Acid-Base Excess 3.0 (*)    Calcium, Ion 1.14 (*)    HCT 25.0 (*)    Hemoglobin 8.5 (*)    All other components within normal limits  OCCULT BLOOD X 1 CARD TO LAB, STOOL  CBC  CBC  TYPE AND SCREEN    EKG None  Radiology No results found.  Procedures Procedures (including critical care time)  Medications Ordered in ED Medications  sodium chloride 0.9 % bolus 1,000 mL (has no administration in time range)  pantoprazole (PROTONIX) 80 mg in sodium chloride 0.9 % 250 mL (0.32 mg/mL) infusion (8 mg/hr Intravenous New Bag/Given 06/24/18 1243)  pantoprazole (PROTONIX) 80 mg in sodium chloride 0.9 % 100 mL IVPB (0 mg Intravenous Stopped 06/24/18 1241)     Initial Impression / Assessment and Plan / ED Course  I have reviewed the triage vital signs and the nursing notes.  Pertinent labs & imaging results that were available during my care of the patient were reviewed by me and considered in my medical decision making (see chart for details).    CRITICAL CARE Performed by: Milton Ferguson Total critical care time: 40 minutes Critical care time was exclusive of separately billable procedures and treating other patients. Critical care was necessary to treat or prevent imminent or life-threatening deterioration. Critical care was time spent personally by me on the following activities: development of treatment plan with patient and/or surrogate as  well as nursing, discussions with consultants, evaluation of patient's response to treatment, examination of patient, obtaining history from patient or surrogate, ordering and performing treatments and interventions, ordering and review of laboratory studies, ordering and review of radiographic studies, pulse oximetry and re-evaluation of patient's condition.      Patient with her upper GI bleed.  GI will consult with medicine admission  Final Clinical Impressions(s) / ED Diagnoses   Final diagnoses:  Upper GI bleed    ED Discharge Orders    None       Milton Ferguson, MD 06/24/18 1300

## 2018-06-24 NOTE — H&P (View-Only) (Signed)
[name]: 12:32 PM 06/24/2018  LOS: 0 days    Referring Provider: Dr [name]  Primary Care Physician:  Dennis Pinto, MD Primary Gastroenterologist:  Dr [name].       Reason for Consultation:  Anemia, black stools.     HPI: [name] is a 85 y.o. male.  PMH CAD and cardiac stenting.  Takes aspirin and Plavix.  Loop recorder placed 01/2018.  4 V CABG 07/2015.  Orthostasis, hypotension since CABG.  LVEF 55 -09%, grade 1 diastolic dysfunction on echo 01/2018.  Small acute CVA 12/2017.  3.8 cm abdominal aortic aneurysm per ultrasound.  05/2017 diagnostic laparoscopy with findings of non-obstructing, long segment jejunitis, Dr. Marcello Frank elected not to remove bowel as it looked viable. 03/2003 EGD.  For dysphagia and reflux symptoms.  Showed chronic, alkaline appearing gastritis.  At midesophagus motility seemed decreased.  Patient's esophagus was not dilated. 10/2010 Colonoscopy.  Higher risk study due to sibling with colorectal cancer and previous hx colon polyps (no tissue submitted).  This study was normal.  Chronic Plavix for ~ 3 months and ASA 81 mg.  No PPI etc.   Last Plavix dose taken was the evening of 2/22, last night  2 weeks of heartburn, indigestion, no nausea or abd pain.  Treating with TUMs PRN.  Was using Ibuprofen 226m/day about 6 times a month but he stopped this more than a week ago on the advice of his MD because of its effect on his kidneys.  Previously took PPI but this was stopped within the last year because of interaction with other meds.  Despite having a good appetite and eating well, he has lost about 15# in the last 3 or 4 months.  In the last 2 to 3 days patient's had black but formed stools.  He attributed that to eating turnip greens.  He felt sluggish during this  time.  This morning he felt very poorly and had several dark, tarry stools.  Nearly passed out.  White as a sheet, diaphoretic.  No pounding heart or chest pain.  Patient has chronic dizziness  Hgb today 9.3 -8.5.  Exactly 1 month ago Hgb was 13.3. MCV 105, was 96 a month ago.  No PT/INR.   Stool FOBT positive.       Past Medical History:  Diagnosis Date  . 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 Colon polyps   . Coronary atherosclerosis of unspecified type of vessel, native or graft   . Diabetes ([facility]   . Difficult intubation   . Esophageal reflux   . High cholesterol   . History of IBS 02/27/2009  . Hypertension    pt denies, he says he has a h/o hypotension. If BP up he adjusts the Florinef  . IBS (irritable bowel syndrome)   . Orthostatic hypotension    "BP has been dropping alot when I stand up for the last month or so" (02/17/2016)  . Vitamin B 12 deficiency   . Vitamin D deficiency     Past Surgical History:  Procedure  Laterality Date  . BLADDER SURGERY  1969   traumatic pelvic fractures, urethral and bladder repair  . CARDIAC CATHETERIZATION N/A 07/01/2015   Procedure: Left Heart Cath and Coronary Angiography;  Surgeon: Dennis Hampshire, MD;  Location: [facility];  Service: Cardiovascular;  Laterality: N/A;  . COLON RESECTION N/A 05/17/2017   Procedure: DIAGNOSTIC LAPAROSCOPY,;  Surgeon: Dennis Ruff, MD;  Location: [facility];  Service: General;  Laterality: N/A;  . CORONARY ANGIOPLASTY WITH STENT PLACEMENT    . CORONARY ARTERY BYPASS GRAFT N/A 07/06/2015   Procedure: CORONARY ARTERY BYPASS GRAFTING (CABG)x 4   utilizing the left internal mammary artery and endoscopically harvested bilateral  sapheneous vein.;  Surgeon: Dennis Poot, MD;  Location: [facility];  Service: Open Heart Surgery;  Laterality: N/A;  . KNEE SURGERY    . LOOP RECORDER INSERTION N/A 02/19/2018   Procedure: LOOP RECORDER INSERTION;  Surgeon: Dennis Sprang, MD;  Location: [facility];   Service: Cardiovascular;  Laterality: N/A;  . TEE WITHOUT CARDIOVERSION N/A 07/06/2015   Procedure: TRANSESOPHAGEAL ECHOCARDIOGRAM (TEE);  Surgeon: Dennis Poot, MD;  Location: [facility];  Service: Open Heart Surgery;  Laterality: N/A;    Prior to Admission medications   Medication Sig Start Date End Date Taking? Authorizing Provider  acetaminophen (TYLENOL) 500 MG tablet Take 1,000 mg by mouth as needed for mild pain.    [provider]  aspirin 81 MG EC tablet TAKE 1 TABLET BY MOUTH EVERY DAY 05/11/18   Dennis Poisson, NP  Blood Glucose Monitoring Suppl (ONE TOUCH ULTRA 2) w/Device KIT Check blood sugar 1 time daily-DX-E11.9. 04/30/18   Dennis Pinto, MD  butalbital-acetaminophen-caffeine (FIORICET, ESGIC) [phone] MG tablet Take 1 tablet every 4 hours ONLY if needed for severe Headache 04/27/18   Dennis Pinto, MD  CALCIUM PO Take 1 tablet by mouth daily.    [provider]  Cholecalciferol (VITAMIN D-3) 5000 units TABS Take 5,000 Units by mouth daily.    [provider]  clopidogrel (PLAVIX) 75 MG tablet Take 1 tablet (75 mg total) by mouth daily. Patient taking differently: Take 75 mg by mouth at bedtime.  01/24/18 01/24/19  Dennis Mutters, PA-C  diclofenac sodium (VOLTAREN) 1 % GEL APPLY 2 GRAMS TOPICALLY TO AFFECTED AREA 4 TIMES DAILY 05/07/18   Dennis Staggers, MD  fludrocortisone (FLORINEF) 0.1 MG tablet Take 0.1 mg by mouth daily.     [provider]  gabapentin (NEURONTIN) 100 MG capsule TAKE 2 CAPSULE(200 MG) BY MOUTH 2 TIMES A DAY. Patient taking differently: Take 200 mg by mouth 2 (two) times daily. TAKE 2 CAPSULE(200 MG) BY MOUTH 2 TIMES A DAY. 10/06/17   Dennis Pinto, MD  glucose blood test strip Check blood sugar 1 time daily-DX-E11.9 07/07/17   Dennis Pinto, MD  Multiple Vitamin (MULTIVITAMIN WITH MINERALS) TABS tablet Take 1 tablet by mouth daily.    [provider]  olmesartan (BENICAR) 40 MG tablet TAKE 1/2 TO 1 TABLET  BY MOUTH EVERY DAY FOR BLOOD PRESSURE 06/17/18   Dennis Pinto, MD  polyethylene glycol [facility] / Floria Raveling) packet Take 17 g by mouth daily. Patient taking differently: Take 17 g by mouth 2 (two) times daily.  05/22/17   Dennis Dubois, MD  traMADol (ULTRAM) 50 MG tablet Take 1 tab up to every 6 hours as needed for severe pain. 06/18/18   Dennis Comber, NP    Scheduled Meds:  Infusions: . pantoprazole (PROTONIX) IVPB 80 mg (06/24/18 1215)  .  pantoprozole (PROTONIX) infusion    . sodium chloride     PRN Meds:    Allergies as of 06/24/2018 - Review Complete 06/24/2018  Allergen Reaction Noted  . Cymbalta [duloxetine hcl]  03/18/2013  . Keflex [cephalexin] Other (See Comments) 03/18/2013  . Simvastatin Other (See Comments) 03/18/2013  . Sudafed [pseudoephedrine]  03/18/2013    Family History  Problem Relation Age of Onset  . Heart attack Father        died age 68  . Heart attack Brother        died age 43  . Anuerysm Brother        aortic  . Heart attack Sister        died age 46  . Colon cancer Sister   . Liver cancer Sister   . Diabetes Maternal Grandmother   . Colon polyps Sister        and brothers x 2     Social History   Socioeconomic History  . Marital status: Married    Spouse name: Not on file  . Number of children: 2  . Years of education: Not on file  . Highest education level: High school graduate  Occupational History  . Occupation: Retired  Social Needs  . Financial resource strain: Not on file  . Food insecurity:    Worry: Not on file    Inability: Not on file  . Transportation needs:    Medical: Not on file    Non-medical: Not on file  Tobacco Use  . Smoking status: Former Smoker    Last attempt to quit: 07/16/1963    Years since quitting: 54.9  . Smokeless tobacco: Former User    Types: Chew    Quit date: 1989  Substance and Sexual Activity  . Alcohol use: No  . Drug use: No  . Sexual activity: Not Currently  Lifestyle  .  Physical activity:    Days per week: Not on file    Minutes per session: Not on file  . Stress: Not on file  Relationships  . Social connections:    Talks on phone: Not on file    Gets together: Not on file    Attends religious service: Not on file    Active member of club or organization: Not on file    Attends meetings of clubs or organizations: Not on file    Relationship status: Not on file  . Intimate partner violence:    Fear of current or ex partner: Not on file    Emotionally abused: Not on file    Physically abused: Not on file    Forced sexual activity: Not on file  Other Topics Concern  . Not on file  Social History Narrative   Lives at home with his wife Evelyn   Right handed   No caffeine    REVIEW OF SYSTEMS: Constitutional: Per HPI.  Patient does not move around a lot because of pain in his back and in his knee.  He is due to follow-up with his orthopedic MD soon and perhaps have spinal injection. ENT:  No nose bleeds Pulm: No shortness of breath.  No cough. CV:  No palpitations, no LE edema.  GU:  No hematuria, no frequency GI: See HPI. Heme: Denies unusual bleeding or bruising. Transfusions: No previous blood transfusions. Neuro:  No headaches.   Chronic dizziness.  Numbness and tingling in his feet and lower legs which is chronic. Derm:  No itching, no rash or   sores.  Endocrine:  No sweats or chills.  No polyuria or dysuria Immunization: Sedation history reviewed.  He is up-to-date on multiple vaccinations including his flu shot. Travel:  None beyond local counties in last few months.    PHYSICAL EXAM: Vital signs in last 24 hours: Vitals:   06/24/18 1036 06/24/18 1047  BP: 100/64   Pulse: 91   Resp: 12   Temp: 98.8 F (37.1 C)   SpO2: 98% 98%   Wt Readings from Last 3 Encounters:  06/24/18 83.9 kg  06/18/18 81.9 kg  06/07/18 75.6 kg    General: Pleasant, elderly WM.  Sitting on the stretcher comfortably.  Does not look ill. Head: No facial  asymmetry or swelling.  No signs of head trauma.  S Eyes: No scleral icterus.  No conjunctival pallor.  EOMI. Ears: Slightly HOH Nose: Scar consistent with removal of skin cancer on the tip of his nose Mouth: Pink, moist, clear oral mucosa.  Tongue midline.  Scant remaining teeth. Neck: No JVD, no masses, no thyromegaly. Lungs: Clear bilaterally.  No cough or labored breathing. Heart: Well-healed sternotomy scar.  Unable to palpate loop recorder.  RRR.  No MRG.  S1, S2 present. Abdomen: Soft.  Not tender or distended.  Active bowel sounds.  Well-healed surgical scars.  1 of his surgical scars is placed exactly midline a couple of inches below his umbilicus and looks like a second umbilicus.  No HSM, masses, bruits..   Rectal: Did not repeat Dr. Ellsworth Lennox rectal exam at which time there was black, loose, FOBT positive stool.  No masses. Musc/Skeltl: No joint redness or swelling. Extremities: No CCE.  Good capillary refill in the toes.  Feet are warm. Neurologic: Moves all 4 limbs, no tremor, strength not tested.  Fully alert and oriented. Skin: No rashes or suspicious sores/lesions. Tattoos: None observed Nodes: No cervical adenopathy Psych: Pleasant, cooperative, calm, fluid speech.  Intake/Output from previous day: No intake/output data recorded. Intake/Output this shift: No intake/output data recorded.  LAB RESULTS: Recent Labs    06/24/18 1141 06/24/18 1148  WBC 10.3  --   HGB 9.3* 8.5*  HCT 30.2* 25.0*  PLT 240  --    BMET Lab Results  Component Value Date   NA 143 06/24/2018   NA 146 05/24/2018   NA 145 04/23/2018   K 3.6 06/24/2018   K 4.9 05/24/2018   K 4.1 04/23/2018   CL 104 05/24/2018   CL 103 04/23/2018   CL 103 01/24/2018   CO2 34 (H) 05/24/2018   CO2 34 (H) 04/23/2018   CO2 32 01/24/2018   GLUCOSE 115 (H) 05/24/2018   GLUCOSE 193 (H) 04/23/2018   GLUCOSE 105 (H) 01/24/2018   BUN 21 05/24/2018   BUN 16 04/23/2018   BUN 22 01/24/2018   CREATININE 1.40  (H) 06/24/2018   CREATININE 1.33 (H) 05/24/2018   CREATININE 1.26 (H) 04/23/2018   CALCIUM 11.0 (H) 05/24/2018   CALCIUM 9.6 04/23/2018   CALCIUM 9.3 01/24/2018   LFT No results for input(s): PROT, ALBUMIN, AST, ALT, ALKPHOS, BILITOT, BILIDIR, IBILI in the last 72 hours. PT/INR Lab Results  Component Value Date   INR 1.45 07/06/2015   INR 1.03 07/05/2015   INR 1.10 07/01/2015   Hepatitis Panel No results for input(s): HEPBSAG, HCVAB, HEPAIGM, HEPBIGM in the last 72 hours. C-Diff No components found for: CDIFF Lipase     Component Value Date/Time   LIPASE 18 05/18/2017 0623    Drugs of Abuse  No results found for: LABOPIA, COCAINSCRNUR, LABBENZ, AMPHETMU, THCU, LABBARB   RADIOLOGY STUDIES: No results found.   IMPRESSION:   *    Blood loss anemia.  Dark, FOBT positive stools.  *    Chronic Plavix and low-dose aspirin.  Last Plavix 2/21 PM.  Hx CVA.  S/p CABG.  Loop monitor implanted after CVA.           PLAN:     *   May have clears today.  N.P.O. after midnight  *     EGD tomorrow, timing TBD.  Risks benefits of the procedure discussed with the patient, patient's wife and patient's brother.  They are agreeable to proceed.   exlained that the endoscopy would be diagnostic only , that Plavix inhibits the endoscopists ability to perform certain hemostatic therapies.  Also explained that depending on the findings of the EGD tomorrow, he may end up having to have repeat endoscopy in a few days after Plavix washout.    *   Continue IV Protonix drip.   3 times daily CBC.   Azucena Freed  06/24/2018, 12:32 PM Phone [phone]     Attending Physician Note   I have taken a history, examined the patient and reviewed the chart. I agree with the Advanced Practitioner's note, impression and recommendations.  Melena and ABL anemia with heartburn, ibuprofen use and on Plavix and ASA. BUN normal. Probably UGI source. R/O ulcer, esophagitis, AVMs, neoplasm. EGD tomorrow,  limited by Plavix, with Dr. Rush Landmark. Colonoscopy if EGD is not diagnostic. IV PPI infusion. DC NSAID use except for ASA. Hold Plavix and ASA for now. Trend CBC.   Lucio Edward, MD FACG [phone]

## 2018-06-24 NOTE — Consult Note (Addendum)
[name]: 12:32 PM 06/24/2018  LOS: 0 days    Referring Provider: Dr [name]  Primary Care Physician:  Dennis Pinto, Frank Primary Gastroenterologist:  [name].       Reason for Consultation:  Anemia, black stools.     HPI: [name] is a 85 y.o. male.  PMH CAD and cardiac stenting.  Takes aspirin and Plavix.  Loop recorder placed 01/2018.  4 V CABG 07/2015.  Orthostasis, hypotension since CABG.  LVEF 55 -09%, grade 1 diastolic dysfunction on echo 01/2018.  Small acute CVA 12/2017.  3.8 cm abdominal aortic aneurysm per ultrasound.  05/2017 diagnostic laparoscopy with findings of non-obstructing, long segment jejunitis, Dennis. Marcello Frank elected not to remove bowel as it looked viable. 03/2003 EGD.  For dysphagia and reflux symptoms.  Showed chronic, alkaline appearing gastritis.  At midesophagus motility seemed decreased.  Patient's esophagus was not dilated. 10/2010 Colonoscopy.  Higher risk study due to sibling with colorectal cancer and previous hx colon polyps (no tissue submitted).  This study was normal.  Chronic Plavix for ~ 3 months and ASA 81 mg.  No PPI etc.   Last Plavix dose taken was the evening of 2/22, last night  2 weeks of heartburn, indigestion, no nausea or abd pain.  Treating with TUMs PRN.  Was using Ibuprofen 226m/day about 6 times a month but he stopped this more than a week ago on the advice of his Frank because of its effect on his kidneys.  Previously took PPI but this was stopped within the last year because of interaction with other meds.  Despite having a good appetite and eating well, he has lost about 15# in the last 3 or 4 months.  In the last 2 to 3 days patient's had black but formed stools.  He attributed that to eating turnip greens.  He felt sluggish during this  time.  This morning he felt very poorly and had several dark, tarry stools.  Nearly passed out.  White as a sheet, diaphoretic.  No pounding heart or chest pain.  Patient has chronic dizziness  Hgb today 9.3 -8.5.  Exactly 1 month ago Hgb was 13.3. MCV 105, was 96 a month ago.  No PT/INR.   Stool FOBT positive.       Past Medical History:  Diagnosis Date  . 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 Colon polyps   . Coronary atherosclerosis of unspecified type of vessel, native or graft   . Diabetes ([place]   . Difficult intubation   . Esophageal reflux   . High cholesterol   . History of IBS 02/27/2009  . Hypertension    pt denies, he says he has a h/o hypotension. If BP up he adjusts the Florinef  . IBS (irritable bowel syndrome)   . Orthostatic hypotension    "BP has been dropping alot when I stand up for the last month or so" (02/17/2016)  . Vitamin B 12 deficiency   . Vitamin D deficiency     Past Surgical History:  Procedure  Laterality Date  . BLADDER SURGERY  1969   traumatic pelvic fractures, urethral and bladder repair  . CARDIAC CATHETERIZATION N/A 07/01/2015   Procedure: Left Heart Cath and Coronary Angiography;  Surgeon: Dennis Hampshire, Frank;  Location: [facility];  Service: Cardiovascular;  Laterality: N/A;  . COLON RESECTION N/A 05/17/2017   Procedure: DIAGNOSTIC LAPAROSCOPY,;  Surgeon: [name];  Location: [facility];  Service: General;  Laterality: N/A;  . CORONARY ANGIOPLASTY WITH STENT PLACEMENT    . CORONARY ARTERY BYPASS GRAFT N/A 07/06/2015   Procedure: CORONARY ARTERY BYPASS GRAFTING (CABG)x 4   utilizing the left internal mammary artery and endoscopically harvested bilateral  sapheneous vein.;  Surgeon: Dennis Poot, Frank;  Location: [facility];  Service: Open Heart Surgery;  Laterality: N/A;  . KNEE SURGERY    . LOOP RECORDER INSERTION N/A 02/19/2018   Procedure: LOOP RECORDER INSERTION;  Surgeon: Dennis Sprang, Frank;  Location: [facility];   Service: Cardiovascular;  Laterality: N/A;  . TEE WITHOUT CARDIOVERSION N/A 07/06/2015   Procedure: TRANSESOPHAGEAL ECHOCARDIOGRAM (TEE);  Surgeon: Dennis Poot, Frank;  Location: [facility];  Service: Open Heart Surgery;  Laterality: N/A;    Prior to Admission medications   Medication Sig Start Date End Date Taking? Authorizing Provider  acetaminophen (TYLENOL) 500 MG tablet Take 1,000 mg by mouth as needed for mild pain.    Provider, Historical, Frank  aspirin 81 MG EC tablet TAKE 1 TABLET BY MOUTH EVERY DAY 05/11/18   Dennis Poisson, NP  Blood Glucose Monitoring Suppl (ONE TOUCH ULTRA 2) w/Device KIT Check blood sugar 1 time daily-DX-E11.9. 04/30/18   Dennis Pinto, Frank  butalbital-acetaminophen-caffeine (FIORICET, ESGIC) [phone] MG tablet Take 1 tablet every 4 hours ONLY if needed for severe Headache 04/27/18   Dennis Pinto, Frank  CALCIUM PO Take 1 tablet by mouth daily.    Provider, Historical, Frank  Cholecalciferol (VITAMIN D-3) 5000 units TABS Take 5,000 Units by mouth daily.    Provider, Historical, Frank  clopidogrel (PLAVIX) 75 MG tablet Take 1 tablet (75 mg total) by mouth daily. Patient taking differently: Take 75 mg by mouth at bedtime.  01/24/18 01/24/19  Dennis Mutters, PA-C  diclofenac sodium (VOLTAREN) 1 % GEL APPLY 2 GRAMS TOPICALLY TO AFFECTED AREA 4 TIMES DAILY 05/07/18   Dennis Staggers, Frank  fludrocortisone (FLORINEF) 0.1 MG tablet Take 0.1 mg by mouth daily.     Provider, Historical, Frank  gabapentin (NEURONTIN) 100 MG capsule TAKE 2 CAPSULE(200 MG) BY MOUTH 2 TIMES A DAY. Patient taking differently: Take 200 mg by mouth 2 (two) times daily. TAKE 2 CAPSULE(200 MG) BY MOUTH 2 TIMES A DAY. 10/06/17   Dennis Pinto, Frank  glucose blood test strip Check blood sugar 1 time daily-DX-E11.9 07/07/17   Dennis Pinto, Frank  Multiple Vitamin (MULTIVITAMIN WITH MINERALS) TABS tablet Take 1 tablet by mouth daily.    Provider, Historical, Frank  olmesartan (BENICAR) 40 MG tablet TAKE 1/2 TO 1 TABLET  BY MOUTH EVERY DAY FOR BLOOD PRESSURE 06/17/18   Dennis Pinto, Frank  polyethylene glycol [facility] Floria Raveling) packet Take 17 g by mouth daily. Patient taking differently: Take 17 g by mouth 2 (two) times daily.  05/22/17   Dennis Dubois, Frank  traMADol (ULTRAM) 50 MG tablet Take 1 tab up to every 6 hours as needed for severe pain. 06/18/18   Dennis Comber, NP    Scheduled Meds:  Infusions: . pantoprazole (PROTONIX) IVPB 80 mg (06/24/18 1215)  .  pantoprozole (PROTONIX) infusion    . sodium chloride     PRN Meds:    Allergies as of 06/24/2018 - Review Complete 06/24/2018  Allergen Reaction Noted  . Cymbalta [duloxetine hcl]  03/18/2013  . Keflex [cephalexin] Other (See Comments) 03/18/2013  . Simvastatin Other (See Comments) 03/18/2013  . Sudafed [pseudoephedrine]  03/18/2013    Family History  Problem Relation Age of Onset  . Heart attack Father        died age 73  . Heart attack Brother        died age 8  . Anuerysm Brother        aortic  . Heart attack Sister        died age 62  . Colon cancer Sister   . Liver cancer Sister   . Diabetes Maternal Grandmother   . Colon polyps Sister        and brothers x 2     Social History   Socioeconomic History  . Marital status: Married    Spouse name: Not on file  . Number of children: 2  . Years of education: Not on file  . Highest education level: High school graduate  Occupational History  . Occupation: Retired  Scientific laboratory technician  . Financial resource strain: Not on file  . Food insecurity:    Worry: Not on file    Inability: Not on file  . Transportation needs:    Medical: Not on file    Non-medical: Not on file  Tobacco Use  . Smoking status: Former Smoker    Last attempt to quit: 07/16/1963    Years since quitting: 54.9  . Smokeless tobacco: Former Systems developer    Types: Chew    Quit date: 1989  Substance and Sexual Activity  . Alcohol use: No  . Drug use: No  . Sexual activity: Not Currently  Lifestyle  .  Physical activity:    Days per week: Not on file    Minutes per session: Not on file  . Stress: Not on file  Relationships  . Social connections:    Talks on phone: Not on file    Gets together: Not on file    Attends religious service: Not on file    Active member of club or organization: Not on file    Attends meetings of clubs or organizations: Not on file    Relationship status: Not on file  . Intimate partner violence:    Fear of current or ex partner: Not on file    Emotionally abused: Not on file    Physically abused: Not on file    Forced sexual activity: Not on file  Other Topics Concern  . Not on file  Social History Narrative   Lives at home with his wife Estill Bamberg   Right handed   No caffeine    REVIEW OF SYSTEMS: Constitutional: Per HPI.  Patient does not move around a lot because of pain in his back and in his knee.  He is due to follow-up with his orthopedic Frank soon and perhaps have spinal injection. ENT:  No nose bleeds Pulm: No shortness of breath.  No cough. CV:  No palpitations, no LE edema.  GU:  No hematuria, no frequency GI: See HPI. Heme: Denies unusual bleeding or bruising. Transfusions: No previous blood transfusions. Neuro:  No headaches.   Chronic dizziness.  Numbness and tingling in his feet and lower legs which is chronic. Derm:  No itching, no rash or  sores.  Endocrine:  No sweats or chills.  No polyuria or dysuria Immunization: Sedation history reviewed.  He is up-to-date on multiple vaccinations including his flu shot. Travel:  None beyond local counties in last few months.    PHYSICAL EXAM: Vital signs in last 24 hours: Vitals:   06/24/18 1036 06/24/18 1047  BP: 100/64   Pulse: 91   Resp: 12   Temp: 98.8 F (37.1 C)   SpO2: 98% 98%   Wt Readings from Last 3 Encounters:  06/24/18 83.9 kg  06/18/18 81.9 kg  06/07/18 75.6 kg    General: Pleasant, elderly WM.  Sitting on the stretcher comfortably.  Does not look ill. Head: No facial  asymmetry or swelling.  No signs of head trauma.  S Eyes: No scleral icterus.  No conjunctival pallor.  EOMI. Ears: Slightly HOH Nose: Scar consistent with removal of skin cancer on the tip of his nose Mouth: Pink, moist, clear oral mucosa.  Tongue midline.  Scant remaining teeth. Neck: No JVD, no masses, no thyromegaly. Lungs: Clear bilaterally.  No cough or labored breathing. Heart: Well-healed sternotomy scar.  Unable to palpate loop recorder.  RRR.  No MRG.  S1, S2 present. Abdomen: Soft.  Not tender or distended.  Active bowel sounds.  Well-healed surgical scars.  1 of his surgical scars is placed exactly midline a couple of inches below his umbilicus and looks like a second umbilicus.  No HSM, masses, bruits..   Rectal: Did not repeat Dennis. Ellsworth Lennox rectal exam at which time there was black, loose, FOBT positive stool.  No masses. Musc/Skeltl: No joint redness or swelling. Extremities: No CCE.  Good capillary refill in the toes.  Feet are warm. Neurologic: Moves all 4 limbs, no tremor, strength not tested.  Fully alert and oriented. Skin: No rashes or suspicious sores/lesions. Tattoos: None observed Nodes: No cervical adenopathy Psych: Pleasant, cooperative, calm, fluid speech.  Intake/Output from previous day: No intake/output data recorded. Intake/Output this shift: No intake/output data recorded.  LAB RESULTS: Recent Labs    06/24/18 1141 06/24/18 1148  WBC 10.3  --   HGB 9.3* 8.5*  HCT 30.2* 25.0*  PLT 240  --    BMET Lab Results  Component Value Date   NA 143 06/24/2018   NA 146 05/24/2018   NA 145 04/23/2018   K 3.6 06/24/2018   K 4.9 05/24/2018   K 4.1 04/23/2018   CL 104 05/24/2018   CL 103 04/23/2018   CL 103 01/24/2018   CO2 34 (H) 05/24/2018   CO2 34 (H) 04/23/2018   CO2 32 01/24/2018   GLUCOSE 115 (H) 05/24/2018   GLUCOSE 193 (H) 04/23/2018   GLUCOSE 105 (H) 01/24/2018   BUN 21 05/24/2018   BUN 16 04/23/2018   BUN 22 01/24/2018   CREATININE 1.40  (H) 06/24/2018   CREATININE 1.33 (H) 05/24/2018   CREATININE 1.26 (H) 04/23/2018   CALCIUM 11.0 (H) 05/24/2018   CALCIUM 9.6 04/23/2018   CALCIUM 9.3 01/24/2018   LFT No results for input(s): PROT, ALBUMIN, AST, ALT, ALKPHOS, BILITOT, BILIDIR, IBILI in the last 72 hours. PT/INR Lab Results  Component Value Date   INR 1.45 07/06/2015   INR 1.03 07/05/2015   INR 1.10 07/01/2015   Hepatitis Panel No results for input(s): HEPBSAG, HCVAB, HEPAIGM, HEPBIGM in the last 72 hours. C-Diff No components found for: CDIFF Lipase     Component Value Date/Time   LIPASE 18 05/18/2017 0623    Drugs of Abuse  No results found for: LABOPIA, COCAINSCRNUR, LABBENZ, AMPHETMU, THCU, LABBARB   RADIOLOGY STUDIES: No results found.   IMPRESSION:   *    Blood loss anemia.  Dark, FOBT positive stools.  *    Chronic Plavix and low-dose aspirin.  Last Plavix 2/21 PM.  Hx CVA.  S/p CABG.  Loop monitor implanted after CVA.           PLAN:     *   May have clears today.  N.P.O. after midnight  *     EGD tomorrow, timing TBD.  Risks benefits of the procedure discussed with the patient, patient's wife and patient's brother.  They are agreeable to proceed.   exlained that the endoscopy would be diagnostic only , that Plavix inhibits the endoscopists ability to perform certain hemostatic therapies.  Also explained that depending on the findings of the EGD tomorrow, he may end up having to have repeat endoscopy in a few days after Plavix washout.    *   Continue IV Protonix drip.   3 times daily CBC.   Azucena Freed  06/24/2018, 12:32 PM Phone [phone]     Attending Physician Note   I have taken a history, examined the patient and reviewed the chart. I agree with the Advanced Practitioner's note, impression and recommendations.  Melena and ABL anemia with heartburn, ibuprofen use and on Plavix and ASA. BUN normal. Probably UGI source. R/O ulcer, esophagitis, AVMs, neoplasm. EGD tomorrow,  limited by Plavix, with Dennis. [facility]. Colonoscopy if EGD is not diagnostic. IV PPI infusion. DC NSAID use except for ASA. Hold Plavix and ASA for now. Trend CBC.   Lucio Edward, Frank FACG [phone]

## 2018-06-24 NOTE — ED Triage Notes (Signed)
Pt reports dark stools for 2 days. Pt takes Plavix and ASA.

## 2018-06-25 ENCOUNTER — Inpatient Hospital Stay (HOSPITAL_COMMUNITY): Payer: Medicare Other | Admitting: Anesthesiology

## 2018-06-25 ENCOUNTER — Encounter (HOSPITAL_COMMUNITY)
Admission: EM | Disposition: A | Discharge status: Home Health | Payer: Self-pay | Source: Home / Self Care | Attending: Family Medicine

## 2018-06-25 ENCOUNTER — Telehealth: Payer: Self-pay | Admitting: Adult Health

## 2018-06-25 ENCOUNTER — Encounter (HOSPITAL_COMMUNITY): Payer: Self-pay

## 2018-06-25 DIAGNOSIS — K921 Melena: Secondary | ICD-10-CM

## 2018-06-25 DIAGNOSIS — K3189 Other diseases of stomach and duodenum: Secondary | ICD-10-CM

## 2018-06-25 HISTORY — PX: BIOPSY: SHX5522

## 2018-06-25 HISTORY — PX: ESOPHAGOGASTRODUODENOSCOPY (EGD) WITH PROPOFOL: SHX5813

## 2018-06-25 LAB — CBC
HCT: 24.3 % — ABNORMAL LOW (ref 39.0–52.0)
HCT: 24.4 % — ABNORMAL LOW (ref 39.0–52.0)
Hemoglobin: 7.4 g/dL — ABNORMAL LOW (ref 13.0–17.0)
Hemoglobin: 7.8 g/dL — ABNORMAL LOW (ref 13.0–17.0)
MCH: 31.1 pg (ref 26.0–34.0)
MCH: 32.6 pg (ref 26.0–34.0)
MCHC: 30.5 g/dL (ref 30.0–36.0)
MCHC: 32 g/dL (ref 30.0–36.0)
MCV: 102.1 fL — ABNORMAL HIGH (ref 80.0–100.0)
MCV: 102.1 fL — ABNORMAL HIGH (ref 80.0–100.0)
Platelets: 182 10*3/uL (ref 150–400)
Platelets: 182 10*3/uL (ref 150–400)
RBC: 2.38 MIL/uL — ABNORMAL LOW (ref 4.22–5.81)
RBC: 2.39 MIL/uL — ABNORMAL LOW (ref 4.22–5.81)
RDW: 13.9 % (ref 11.5–15.5)
RDW: 13.9 % (ref 11.5–15.5)
WBC: 7.6 10*3/uL (ref 4.0–10.5)
WBC: 8.2 10*3/uL (ref 4.0–10.5)
nRBC: 0 % (ref 0.0–0.2)
nRBC: 0 % (ref 0.0–0.2)

## 2018-06-25 LAB — BASIC METABOLIC PANEL
Anion gap: 6 (ref 5–15)
BUN: 26 mg/dL — ABNORMAL HIGH (ref 8–23)
CO2: 26 mmol/L (ref 22–32)
Calcium: 7.4 mg/dL — ABNORMAL LOW (ref 8.9–10.3)
Chloride: 110 mmol/L (ref 98–111)
Creatinine, Ser: 1.13 mg/dL (ref 0.61–1.24)
GFR calc Af Amer: >60 mL/min (ref >60)
GFR calc non Af Amer: 60 mL/min — ABNORMAL LOW (ref >60)
Glucose, Bld: 113 mg/dL — ABNORMAL HIGH (ref 70–99)
Potassium: 3 mmol/L — ABNORMAL LOW (ref 3.5–5.1)
Sodium: 142 mmol/L (ref 135–145)

## 2018-06-25 LAB — GLUCOSE, CAPILLARY
Glucose-Capillary: 100 mg/dL — ABNORMAL HIGH (ref 70–99)
Glucose-Capillary: 137 mg/dL — ABNORMAL HIGH (ref 70–99)
Glucose-Capillary: 151 mg/dL — ABNORMAL HIGH (ref 70–99)

## 2018-06-25 SURGERY — ESOPHAGOGASTRODUODENOSCOPY (EGD) WITH PROPOFOL
Anesthesia: Monitor Anesthesia Care

## 2018-06-25 MED ORDER — SODIUM CHLORIDE 0.9 % IV SOLN
INTRAVENOUS | Status: DC
  Administered 2018-06-25: 11:00:00 via INTRAVENOUS

## 2018-06-25 MED ORDER — SPOT INK MARKER SYRINGE KIT: PACK | SUBMUCOSAL | Status: DC | PRN

## 2018-06-25 MED ORDER — PROPOFOL 10 MG/ML IV BOLUS
INTRAVENOUS | Status: DC | PRN
  Administered 2018-06-25: 30 mg via INTRAVENOUS

## 2018-06-25 MED ORDER — PROPOFOL 500 MG/50ML IV EMUL
INTRAVENOUS | Status: DC | PRN
  Administered 2018-06-25: 150 ug/kg/min via INTRAVENOUS

## 2018-06-25 MED ORDER — SODIUM CHLORIDE (PF) 0.9 % IJ SOLN: INTRAMUSCULAR | Status: DC | PRN

## 2018-06-25 SURGICAL SUPPLY — 15 items

## 2018-06-25 NOTE — Telephone Encounter (Signed)
I spoke with wife that pt is hospitalized due bleeding ulcers. I also stated that the hospital MD decide when to hold the plavix. I also stated they make the decision and are apart of [facility]. The wife verbalized understanding.

## 2018-06-25 NOTE — Progress Notes (Addendum)
[facility]  PROGRESS NOTE  [name] HAL:[id number] DOB: 02-22-1934 DOA: 06/24/2018 PCP: Dennis Pinto, MD   Brief HPI:   85 year old male with a history of hypertension, coronary disease status post stenting to LAD in 2009, RCA 2011 who is on aspirin and Plavix came to hospital with black stool.  He denies abdominal pain.  In the ED hemoglobin was found to be 9.0.  Hemoccult was positive.  GI was consulted and plan for EGD this a.m.   Subjective   Patient seen and examined, denies abdominal pain.   Assessment/Plan:     1. Melena/GI bleed-likely upper GI bleed, plan for EGD today.  Patient is n.p.o.Continue IV Protonix infusion.  Will change to Protonix 40 mg IV every 12 hours from tomorrow morning.  2. Anemia-secondary to GI bleed as above.  Hemoglobin checked this morning is 7.4.  Transfuse PRBC for hemoglobin less than 7.0.  3. CKD stage III-creatinine is 1.40, at baseline.  4. Diabetes mellitus type 2-diet controlled, continue sliding scale insulin with NovoLog.  5. CAD status post CABG x4-aspirin and Plavix are on hold for the procedure this morning.  6. GERD-continue Protonix.    Labs for a.m :CBC  CBG: Recent Labs  Lab 06/24/18 1759 06/24/18 2200 06/25/18 0741  GLUCAP 102* 203* 100*    CBC: Recent Labs  Lab 06/24/18 1141 06/24/18 1148 06/24/18 1459 06/24/18 1829 06/25/18 0800  WBC 10.3  --  10.2 10.4 7.6  NEUTROABS 7.9*  --   --   --   --   HGB 9.3* 8.5* 9.2* 9.1* 7.4*  HCT 30.2* 25.0* 27.6* 28.8* 24.3*  MCV 104.9*  --  99.3 99.7 102.1*  PLT 240  --  228 236 735    Basic Metabolic Panel: Recent Labs  Lab 06/24/18 1141 06/24/18 1148 06/24/18 1149  NA 142 143  --   K 3.6 3.6  --   CL 106  --   --   CO2 26  --   --   GLUCOSE 105*  --   --   BUN 51*  --   --   CREATININE 1.41*  --  1.40*  CALCIUM 8.4*  --   --      DVT prophylaxis: SCDs  Code Status: Full code  Family Communication: Discussed with wife at  bedside  Disposition Plan: likely home when medically ready for discharge     Consultants:  Gastroenterology  Procedures:  EGD   Antibiotics:   Anti-infectives (From admission, onward)   None       Objective   Vitals:   06/24/18 2300 06/25/18 0300 06/25/18 0741 06/25/18 0943  BP: 128/77 131/78 (!) 153/82 (!) 184/86  Pulse: (!) 102 94 89 88  Resp: 14 14  17   Temp: 99 F (37.2 C) 98.8 F (37.1 C) 98.3 F (36.8 C) 99 F (37.2 C)  TempSrc: Oral Oral Oral Oral  SpO2: 99% 96% 98% 97%  Weight:      Height:        Intake/Output Summary (Last 24 hours) at 06/25/2018 1014 Last data filed at 06/25/2018 0600 Gross per 24 hour  Intake 1420 ml  Output 600 ml  Net 820 ml   Filed Weights   06/24/18 1037 06/24/18 1048  Weight: 83.9 kg 83.9 kg     Physical Examination:    General: Appears in no acute distress  Cardiovascular: S1-S2, regular, no murmur auscultated  Respiratory: Clear to auscultation bilaterally, no wheezing or crackles.  Abdomen: Abdomen  is soft, nontender, no organomegaly  Extremities: No edema in the lower extremities  Neurologic: Alert, oriented x3, no focal deficit noted.     Data Reviewed: I have personally reviewed following labs and imaging studies   Recent Results (from the past 240 hour(s))  MRSA PCR Screening     Status: None   Collection Time: 06/24/18  3:15 PM  Result Value Ref Range Status   MRSA by PCR NEGATIVE NEGATIVE Final    Comment:        The GeneXpert MRSA Assay (FDA approved for NASAL specimens only), is one component of a comprehensive MRSA colonization surveillance program. It is not intended to diagnose MRSA infection nor to guide or monitor treatment for MRSA infections. Performed at [facility], [place] [address], [place], [place] [address]      Liver Function Tests: Recent Labs  Lab 06/24/18 1141  AST 17  ALT 9  ALKPHOS 46  BILITOT 0.2*  PROT 5.3*  ALBUMIN 3.0*      Studies: No  results found.  Scheduled Meds: . [MAR Hold] diclofenac sodium  2 g Topical QID  . [MAR Hold] feeding supplement  1 Container Oral TID BM  . [MAR Hold] fludrocortisone  0.1 mg Oral BID  . [MAR Hold] gabapentin  200 mg Oral BID  . [MAR Hold] insulin aspart  0-15 Units Subcutaneous TID WC  . [MAR Hold] insulin aspart  0-5 Units Subcutaneous QHS    Admission status: Inpatient: Based on patients clinical presentation and evaluation of above clinical data, I have made determination that patient meets Inpatient criteria at this time.  Time spent: 20 min  [facility] Pager [phone]. If 7PM-7AM, please contact night-coverage at [email or web address], Office  [phone]  password [email or web address]  06/25/2018, 10:14 AM  LOS: 1 day

## 2018-06-25 NOTE — Anesthesia Postprocedure Evaluation (Signed)
Anesthesia Post Note  Patient: [name]  Procedure(s) Performed: ESOPHAGOGASTRODUODENOSCOPY (EGD) WITH PROPOFOL (N/A ) BIOPSY     Patient location during evaluation: PACU Anesthesia Type: MAC Level of consciousness: awake and alert Pain management: pain level controlled Vital Signs Assessment: post-procedure vital signs reviewed and stable Respiratory status: spontaneous breathing, nonlabored ventilation, respiratory function stable and patient connected to nasal cannula oxygen Cardiovascular status: stable and blood pressure returned to baseline Postop Assessment: no apparent nausea or vomiting Anesthetic complications: no    Last Vitals:  Vitals:   06/25/18 1115 06/25/18 1125  BP: 133/74 (!) 166/62  Pulse: 78 81  Resp: 15 17  Temp:    SpO2: 98% 97%    Last Pain:  Vitals:   06/25/18 1059  TempSrc: Oral  PainSc: 0-No pain                 Rosellen Lichtenberger DAVID

## 2018-06-25 NOTE — Telephone Encounter (Signed)
[name] advised he has been hospitalized due to bleeding ulcers in the stomach and intestines. She said Dr Manfred Shirts is requesting the patient to stop plavix and aspirin for 7-10days to decrease the risk of rebleeding.  Please call to advise

## 2018-06-25 NOTE — Op Note (Addendum)
[facility] Patient Name: [name] Procedure Date : 06/25/2018 MRN: [id number] Attending MD: Justice Britain , MD Date of Birth: 08/25/1933 CSN: [id number] Age: 85 Admit Type: Inpatient Procedure:                Upper GI endoscopy Indications:              Acute post hemorrhagic anemia, Dysphagia, Melena Providers:                Justice Britain, MD, Elna Breslow, RN, Elspeth Cho Tech., Technician, Tawni Carnes, CRNA Referring MD:             Dr. Darrick Meigs ([facility]), Pricilla Riffle. Fuller Plan, MD Medicines:                Monitored Anesthesia Care Complications:            No immediate complications. Estimated Blood Loss:     Estimated blood loss was minimal. Procedure:                Pre-Anesthesia Assessment:                           - Prior to the procedure, a History and Physical                            was performed, and patient medications and                            allergies were reviewed. The patient's tolerance of                            previous anesthesia was also reviewed. The risks                            and benefits of the procedure and the sedation                            options and risks were discussed with the patient.                            All questions were answered, and informed consent                            was obtained. Prior Anticoagulants: The patient                            last took aspirin 2 days and Plavix (clopidogrel) 2                            days prior to the procedure. ASA Grade Assessment:                            III - A patient with severe systemic disease. After  reviewing the risks and benefits, the patient was                            deemed in satisfactory condition to undergo the                            procedure.                           After obtaining informed consent, the endoscope was                            passed under  direct vision. Throughout the                            procedure, the patient's blood pressure, pulse, and                            oxygen saturations were monitored continuously. The                            GIF-H190 ([id number]) Olympus gastroscope was                            introduced through the mouth, and advanced to the                            second part of duodenum. The upper GI endoscopy was                            accomplished without difficulty. The patient                            tolerated the procedure. Scope In: Scope Out: Findings:      No gross lesions were noted in the entire esophagus. Biopsies were taken       with a cold forceps for histology to rule out EoE.      Multiple dispersed, 1 to 6 mm non-bleeding erosions were found in the       entire examined stomach (linear as well as oval). There were stigmata of       recent bleeding.      Striped moderately erythematous mucosa without bleeding was found in the       gastric body. Biopsies were taken with a cold forceps for histology and       Helicobacter pylori testing from the entire stomach.      Three non-bleeding cratered duodenal ulcers were found in the duodenal       bulb, and in the D1/D2 angle-sweep of the duodenum. The largest lesion       was 18 mm in largest dimension in the bulb. The largest ulcer had a a       flat pigmented spot (Forrest Class IIc). Due to recent Plavix use, there       is a relative contraindication to Gold-Probe Bipolar cautery, so I       elected to hold on that currently.  No gross lesions were noted in the second portion of the duodenum. Impression:               - No gross lesions in esophagus. Biopsied.                           - Non-bleeding erosive gastropathy.                           - Erythematous mucosa in the gastric body. Biopsied.                           - Multiple non-bleeding duodenal ulcers (bulb &                            D1/D2 sweep) with  largest having a flat pigmented                            spot (Forrest Class IIc) in the bulb.                           - No gross lesions in the second portion of the                            duodenum. Recommendation:           - The patient will be observed post-procedure,                            until all discharge criteria are met.                           - Discharge patient to home.                           - Patient has a contact number available for                            emergencies. The signs and symptoms of potential                            delayed complications were discussed with the                            patient. Return to normal activities tomorrow.                            Written discharge instructions were provided to the                            patient.                           - Full liquid diet today.                           -  Advance diet as tolerated tomorrow.                           - Continue IV PPI BID x 72-hours.                           - If able to hold initiation of Plavix for 7-10                            days that would be ideal to further decrease risk                            of rebleeding.                           - If necessary to restart Aspirin would do at low                            dose and monitor closely.                           - If evidence of significant bleeding in regards to                            manifestation of hematemesis/melena/transfusion                            dependent anemia please alert the GI service to                            consider if repeat EGD should be performed to                            evaluate when he is off Plavix for total of 5-days                            if there is a ability to consider further                            therapies. If he bleeds significantly and not                            hemodynamically stable for a scope then IR                             discussion for GDA embolization may be necessary.                           - Repeat EGD in 2-3 months to ensure healing.                           - The findings and recommendations were discussed  with the patient.                           - The findings and recommendations were discussed                            with the referring physician. Procedure Code(s):        --- Professional ---                           [phone], Esophagogastroduodenoscopy, flexible,                            transoral; with biopsy, single or multiple Diagnosis Code(s):        --- Professional ---                           K31.89, Other diseases of stomach and duodenum                           K26.9, Duodenal ulcer, unspecified as acute or                            chronic, without hemorrhage or perforation                           D62, Acute posthemorrhagic anemia                           R13.10, Dysphagia, unspecified                           K92.1, Melena (includes Hematochezia) CPT copyright 2018 American Medical Association. All rights reserved. The codes documented in this report are preliminary and upon coder review may  be revised to meet current compliance requirements. Justice Britain, MD 06/25/2018 11:16:17 AM Number of Addenda: 0

## 2018-06-25 NOTE — Progress Notes (Signed)
Discussed with cardiology Dr. Marlou Porch, who says it is okay to hold both aspirin and Plavix as long as GI feels the need for it.  And patient can be restarted on monotherapy with aspirin or Plavix depending on GI recommendations.  Will continue to hold aspirin and Plavix at this time.

## 2018-06-25 NOTE — Interval H&P Note (Signed)
History and Physical Interval Note:  06/25/2018 10:18 AM  [name]  has presented today for surgery, with the diagnosis of Melenic stool, anemia  The various methods of treatment have been discussed with the patient and family. After consideration of risks, benefits and other options for treatment, the patient has consented to  Procedure(s): ESOPHAGOGASTRODUODENOSCOPY (EGD) WITH PROPOFOL (N/A) as a surgical intervention .  The patient's history has been reviewed, patient examined, no change in status, stable for surgery.  I have reviewed the patient's chart and labs.  Questions were answered to the patient's satisfaction.     [employer]

## 2018-06-25 NOTE — Transfer of Care (Signed)
Immediate Anesthesia Transfer of Care Note  Patient: [name]  Procedure(s) Performed: ESOPHAGOGASTRODUODENOSCOPY (EGD) WITH PROPOFOL (N/A ) BIOPSY  Patient Location: Endoscopy Unit  Anesthesia Type:MAC  Level of Consciousness: drowsy  Airway & Oxygen Therapy: Patient Spontanous Breathing and Patient connected to nasal cannula oxygen  Post-op Assessment: Report given to RN and Post -op Vital signs reviewed and stable  Post vital signs: Reviewed and stable  Last Vitals:  Vitals Value Taken Time  BP 92/49 06/25/2018 11:00 AM  Temp 36.4 C 06/25/2018 10:59 AM  Pulse 82 06/25/2018 11:02 AM  Resp 20 06/25/2018 11:02 AM  SpO2 98 % 06/25/2018 11:02 AM  Vitals shown include unvalidated device data.  Last Pain:  Vitals:   06/25/18 1059  TempSrc: Oral  PainSc: 0-No pain      Patients Stated Pain Goal: 0 (93/23/55 7322)  Complications: No apparent anesthesia complications

## 2018-06-25 NOTE — Anesthesia Preprocedure Evaluation (Signed)
Anesthesia Evaluation  Patient identified by MRN, date of birth, ID band Patient awake    Reviewed: Allergy & Precautions, NPO status , Patient's Chart, lab work & pertinent test results  History of Anesthesia Complications (+) DIFFICULT AIRWAY  Airway Mallampati: I  TM Distance: >3 FB Neck ROM: Full    Dental   Pulmonary former smoker,    Pulmonary exam normal        Cardiovascular hypertension, Pt. on medications Normal cardiovascular exam     Neuro/Psych    GI/Hepatic GERD  ,  Endo/Other  diabetes, Type 2, Insulin Dependent  Renal/GU Renal InsufficiencyRenal disease     Musculoskeletal   Abdominal   Peds  Hematology   Anesthesia Other Findings   Reproductive/Obstetrics                             Anesthesia Physical Anesthesia Plan  ASA: III  Anesthesia Plan: MAC   Post-op Pain Management:    Induction: Intravenous  PONV Risk Score and Plan: 1 and Treatment may vary due to age or medical condition  Airway Management Planned: Simple Face Mask  Additional Equipment:   Intra-op Plan:   Post-operative Plan:   Informed Consent: I have reviewed the patients History and Physical, chart, labs and discussed the procedure including the risks, benefits and alternatives for the proposed anesthesia with the patient or authorized representative who has indicated his/her understanding and acceptance.       Plan Discussed with: CRNA and Surgeon  Anesthesia Plan Comments:         Anesthesia Quick Evaluation

## 2018-06-25 NOTE — Progress Notes (Signed)
Initial Nutrition Assessment  DOCUMENTATION CODES:   Not applicable  INTERVENTION:   Advance diet as medically appropriate, with goal of Heart Healthy/Carb Modified Continue Boost Breeze TID (each provides 250 kcal, 9 g protein)   NUTRITION DIAGNOSIS:   Inadequate oral intake related to inability to eat as evidenced by NPO status, estimated needs.  GOAL:   Patient will meet greater than or equal to 90% of their needs  MONITOR:   PO intake, Supplement acceptance, Diet advancement, Labs, Weight trends  REASON FOR ASSESSMENT:   Malnutrition Screening Tool    ASSESSMENT:   85 yo male, admitted for surgery with dx of Melenic stool, anemia. PMH significant for colon polyps, diabetes, esophageal reflux, HLD, HTN, IBS, vitamin D deficiency, vitamin B-12 deficiency.  Labs: glucose 105, BUN 51, Creatinine 1.40, ionized Ca 1.14, tProtein 5.3 Meds: Boost Breeze, novolog, Protonix  Pt alert and pleasant, resting in bed at time of visit. Endorses variable appetite. States he has lost 15-20 lbs x 3 months, likely d/t chronic pain. Unable to verify wt loss in chart. Endorses abdominal pain PTA. Discussed diet advancement to Clear Liquids when medically appropriate. Encouraged pt to include protein-rich foods with all meals and snacks when diet advances, and to eat those foods first if not feeling hungry. Pt amenable to continuing Boost Breeze TID.  NUTRITION - FOCUSED PHYSICAL EXAM:   Most Recent Value  Orbital Region  No depletion  Upper Arm Region  No depletion  Thoracic and Lumbar Region  No depletion  Buccal Region  No depletion  Temple Region  No depletion  Clavicle Bone Region  No depletion  Clavicle and Acromion Bone Region  No depletion  Scapular Bone Region  No depletion  Dorsal Hand  No depletion  Patellar Region  No depletion  Anterior Thigh Region  No depletion  Posterior Calf Region  No depletion  Edema (RD Assessment)  None  Hair  Reviewed  Eyes  Reviewed  Mouth   Reviewed  Skin  Reviewed  Nails  Reviewed      Diet Order:  No known food allergies 2/23: NPO, Clear Liquid 2/24: NPO Diet Order            Diet NPO time specified  Diet effective midnight             No meals recorded  EDUCATION NEEDS:  Education needs have been addressed  Skin:  Skin Assessment: Reviewed RN Assessment  Last BM:  PTA  Height:  Ht Readings from Last 1 Encounters:  06/24/18 6' (1.829 m)    Weight:  Wt Readings from Last 10 Encounters:  06/24/18 83.9 kg  06/18/18 81.9 kg  06/07/18 75.6 kg  06/04/18 75.6 kg  05/25/18 85.3 kg  05/24/18 83.9 kg  05/07/18 90 kg  04/30/18 86.2 kg  04/23/18 87.7 kg  04/11/18 83.9 kg  Noted wt fluctuations x 2 months   Ideal Body Weight:  80.9 kg  BMI:  Body mass index is 25.09 kg/m.overweight, considered protective for age group  Estimated Nutritional Needs: calculated based on 2/23 wt (~84 kg)  Kcal:  2100-2520 (25-30 kcal/kg)  Protein:  97-126 gm (1.2-1.5 g/kg)  Fluid:  1 mL/kcal or per MD  Althea Grimmer, MS, RDN, LDN Pager: [phone] Available Mondays and Fridays, 9am-2pm

## 2018-06-26 LAB — HEMOGLOBIN AND HEMATOCRIT, BLOOD
HCT: 23.5 % — ABNORMAL LOW (ref 39.0–52.0)
HCT: 26.3 % — ABNORMAL LOW (ref 39.0–52.0)
Hemoglobin: 7.6 g/dL — ABNORMAL LOW (ref 13.0–17.0)
Hemoglobin: 8.6 g/dL — ABNORMAL LOW (ref 13.0–17.0)

## 2018-06-26 LAB — CBC
HCT: 21 % — ABNORMAL LOW (ref 39.0–52.0)
Hemoglobin: 6.4 g/dL — CL (ref 13.0–17.0)
MCH: 31.1 pg (ref 26.0–34.0)
MCHC: 30.5 g/dL (ref 30.0–36.0)
MCV: 101.9 fL — ABNORMAL HIGH (ref 80.0–100.0)
Platelets: 180 10*3/uL (ref 150–400)
RBC: 2.06 MIL/uL — ABNORMAL LOW (ref 4.22–5.81)
RDW: 13.7 % (ref 11.5–15.5)
WBC: 8.7 10*3/uL (ref 4.0–10.5)
nRBC: 0 % (ref 0.0–0.2)

## 2018-06-26 LAB — PREPARE RBC (CROSSMATCH)

## 2018-06-26 LAB — GLUCOSE, CAPILLARY
Glucose-Capillary: 123 mg/dL — ABNORMAL HIGH (ref 70–99)
Glucose-Capillary: 131 mg/dL — ABNORMAL HIGH (ref 70–99)
Glucose-Capillary: 134 mg/dL — ABNORMAL HIGH (ref 70–99)
Glucose-Capillary: 114 mg/dL — ABNORMAL HIGH (ref 70–99)

## 2018-06-26 MED ORDER — SODIUM CHLORIDE 0.9% IV SOLUTION
Freq: Once | INTRAVENOUS | Status: AC
  Administered 2018-06-26: 05:00:00 via INTRAVENOUS

## 2018-06-26 MED ORDER — SODIUM CHLORIDE 0.9% IV SOLUTION
Freq: Once | INTRAVENOUS | Status: AC
  Administered 2018-06-26: 14:00:00 via INTRAVENOUS

## 2018-06-26 NOTE — Progress Notes (Signed)
Physical Therapy Evaluation Patient Details Name: [name] MRN: [id number] DOB: 1933/07/29 Today's Date: 06/26/2018   History of Present Illness  Patient is 85 y/o male admitted to hospital with acute blood loss anemia secondary to GI bleed. Patient is s/p EGD revealing multiple nonbleeding erosions in the stomach, 3 large nonbleeding duodenal ulcers. PMH includes CAD s/p CABG, CKD, DMII, HTN, GERD, and IBS.   Clinical Impression  Patient admitted to hospital secondary to problems above and with deficits below. Patient ambulated with min guard and use of RW. Education provided about using RW for all ambulation. Patient reports wife is able to provide necessary assistance at home. Given functional mobility deficits, recommending HHPT following d/c. Patient will benefit from acute physical therapy to maximize independence and safety with functional mobility.     Follow Up Recommendations Home health PT;Supervision/Assistance - 24 hour    Equipment Recommendations  None recommended by PT    Recommendations for Other Services       Precautions / Restrictions Precautions Precautions: Fall Restrictions Weight Bearing Restrictions: No      Mobility  Bed Mobility Overal bed mobility: Needs Assistance Bed Mobility: Supine to Sit     Supine to sit: Supervision     General bed mobility comments: Patient required supervision for bed mobility for safety.   Transfers Overall transfer level: Needs assistance Equipment used: Rolling walker (2 wheeled) Transfers: Sit to/from Stand Sit to Stand: Min guard         General transfer comment: Patient required min guard to stand with use of RW. Verbal cues for hand placment prior to standing when using RW.   Ambulation/Gait Ambulation/Gait assistance: Min guard Gait Distance (Feet): 75 Feet Assistive device: Rolling walker (2 wheeled) Gait Pattern/deviations: Step-through pattern;Decreased step length - right;Decreased step  length - left;Decreased stride length Gait velocity: decreased Gait velocity interpretation: <1.8 ft/sec, indicate of risk for recurrent falls General Gait Details: Patient ambulated with min guard and use of RW for safety. Required verbal cues for sequencing and proximity to RW. Patient states he is walking slower than usual because he is doesn't want to fall.   Stairs            Wheelchair Mobility    Modified Rankin (Stroke Patients Only)       Balance Overall balance assessment: Needs assistance Sitting-balance support: No upper extremity supported;Feet supported Sitting balance-Leahy Scale: Good     Standing balance support: Bilateral upper extremity supported Standing balance-Leahy Scale: Poor Standing balance comment: reliant on BUE support to maintain standing balance                             Pertinent Vitals/Pain Pain Assessment: 0-10 Pain Score: 6  Pain Location: R knee and back Pain Descriptors / Indicators: Aching;Discomfort Pain Intervention(s): Limited activity within patient's tolerance;Monitored during session    Home Living Family/patient expects to be discharged to:: Private residence Living Arrangements: Spouse/significant other Available Help at Discharge: Family;Available 24 hours/day Type of Home: House Home Access: Stairs to enter Entrance Stairs-Rails: Right Entrance Stairs-Number of Steps: 3 Home Layout: One level Home Equipment: Cane - single point;Walker - 2 wheels;Shower seat      Prior Function Level of Independence: Independent with assistive device(s)         Comments: Patient reports using a cane for community ambulation and RW for household ambulation.      Hand Dominance  Extremity/Trunk Assessment   Upper Extremity Assessment Upper Extremity Assessment: Overall WFL for tasks assessed    Lower Extremity Assessment Lower Extremity Assessment: Generalized weakness;RLE deficits/detail RLE  Deficits / Details: Reports R knee pain at baseline    Cervical / Trunk Assessment Cervical / Trunk Assessment: Normal  Communication   Communication: No difficulties  Cognition Arousal/Alertness: Awake/alert Behavior During Therapy: WFL for tasks assessed/performed Overall Cognitive Status: Within Functional Limits for tasks assessed                                        General Comments General comments (skin integrity, edema, etc.): Patient friend in room during session. Education provided about using RW for all ambulation. Patient reports baseline dizziness since CVA.     Exercises     Assessment/Plan    PT Assessment Patient needs continued PT services  PT Problem List Decreased strength;Decreased range of motion;Decreased activity tolerance;Decreased balance;Decreased mobility;Decreased knowledge of use of DME;Pain       PT Treatment Interventions DME instruction;Gait training;Stair training;Functional mobility training;Therapeutic activities;Therapeutic exercise;Balance training;Patient/family education    PT Goals (Current goals can be found in the Care Plan section)  Acute Rehab PT Goals Patient Stated Goal: go home PT Goal Formulation: With patient Time For Goal Achievement: 07/10/18 Potential to Achieve Goals: Good    Frequency Min 3X/week   Barriers to discharge        Co-evaluation               AM-PAC PT "6 Clicks" Mobility  Outcome Measure Help needed turning from your back to your side while in a flat bed without using bedrails?: None Help needed moving from lying on your back to sitting on the side of a flat bed without using bedrails?: None Help needed moving to and from a bed to a chair (including a wheelchair)?: A Little Help needed standing up from a chair using your arms (e.g., wheelchair or bedside chair)?: A Little Help needed to walk in hospital room?: A Little Help needed climbing 3-5 steps with a railing? : A Lot 6  Click Score: 19    End of Session Equipment Utilized During Treatment: Gait belt Activity Tolerance: Patient tolerated treatment well Patient left: in bed;with call bell/phone within reach;with family/visitor present;with nursing/sitter in room(sitting EOB) Nurse Communication: Mobility status PT Visit Diagnosis: Unsteadiness on feet (R26.81);Muscle weakness (generalized) (M62.81);Pain;Difficulty in walking, not elsewhere classified (R26.2) Pain - Right/Left: Right Pain - part of body: Knee    Time: 9833-8250 PT Time Calculation (min) (ACUTE ONLY): 16 min   Charges:   PT Evaluation $PT Eval Moderate Complexity: 1 Mod          Erick Blinks, SPT  Erick Blinks 06/26/2018, 3:29 PM

## 2018-06-26 NOTE — Progress Notes (Signed)
Patient ID: [name], male   DOB: April 03, 1934, 85 y.o.   MRN: [id number]   BRIEF GI NOTE: Patient received 1 unit of PRBCs this am, post infusion Hg 7.6 up from 6.4. HCT 23.5 up from 21. Dr. Rush Landmark recommended another PRBC infusion at this time. Patient was notified and consented to receiving 2nd infusion. Lungs remain clear on exam. Repeat H/H post transfusion.

## 2018-06-26 NOTE — Progress Notes (Signed)
      Gastroenterology Progress Note  CC:  UGI bleed, anemia   Subjective: No abdominal pain, nausea or vomiting. No further melena, no BM since admission. No chest pain or SOB. Ate a soft breakfast this am. Wife at bedside.  Last dose of Plavix was 06/23/2018.   Objective:  S/P EGD 06/25/2018: Identified nonbleeding erosive gastropathy, erythematous mucosa in the gastric body, multiple nonbleeding erosions in the stomach,  3 nonbleeding duodenal ulcers with largest having a flat pigmented spot in the bulb, no gross lesions in the second portion of the duodenum, the esophagus was normal.   Vital signs in last 24 hours: Temp:  [97.6 F (36.4 C)-99.1 F (37.3 C)] 98.2 F (36.8 C) (02/25 0835) Pulse Rate:  [78-93] 93 (02/25 0835) Resp:  [14-19] 17 (02/25 0835) BP: (92-166)/(49-85) 127/77 (02/25 0835) SpO2:  [96 %-99 %] 98 % (02/25 0835) Last BM Date: 06/24/18 General:   Alert,  Well-developed, in NAD Heart:  Regular rate and rhythm; no murmurs Pulm; clear throughout Abdomen:  Soft,  Nondistended, slight tenderness LUQ without rebound or guarding.  Extremities:  Without edema. Neurologic:  Alert and  oriented x4;  grossly normal neurologically. Psych:  Alert and cooperative. Normal mood and affect.  Intake/Output from previous day: 02/24 0701 - 02/25 0700 In: 1402.4 [P.O.:240; I.V.:847.4; Blood:315] Out: 1000 [Urine:1000] Intake/Output this shift: Total I/O In: 100 [I.V.:100] Out: -   Lab Results: Recent Labs    06/25/18 0800 06/25/18 1249 06/26/18 0227  WBC 7.6 8.2 8.7  HGB 7.4* 7.8* 6.4*  HCT 24.3* 24.4* 21.0*  PLT 182 182 180   BMET Recent Labs    06/24/18 1141 06/24/18 1148 06/24/18 1149 06/25/18 1235  NA 142 143  --  142  K 3.6 3.6  --  3.0*  CL 106  --   --  110  CO2 26  --   --  26  GLUCOSE 105*  --   --  113*  BUN 51*  --   --  26*  CREATININE 1.41*  --  1.40* 1.13  CALCIUM 8.4*  --   --  7.4*   LFT Recent Labs    06/24/18 1141  PROT  5.3*  ALBUMIN 3.0*  AST 17  ALT 9  ALKPHOS 46  BILITOT 0.2*   PT/INR No results for input(s): LABPROT, INR in the last 72 hours. Hepatitis Panel No results for input(s): HEPBSAG, HCVAB, HEPAIGM, HEPBIGM in the last 72 hours.  No results found.  Assessment / Plan:  1. Upper GI Bleed/Melena. EGD 06/25/2018 identified multiple nonbleeding erosions in the stomach, 3 large nonbleeding duodenal ulcers,  Hg 6.4 down from 7.8. Hct 21 down from 24.4. Transfused 1 unit PRBCs this am. Repeat H/H due now.  -await repeat H/H -continue to monitor closely for active GI bleeding, recurrence of melena -continue PPI drip  -will need repeat EGD as out patient in 2 to 3 months to check ulcer healing  2. Anemia secondary to # 1    3. Heart Disease, Stents, past CABG. Hx CVA. -Dr. Rush Landmark to  ASA and Plavix restart        LOS: 2 days   Noralyn Pick  06/26/2018, 10:00 AM

## 2018-06-26 NOTE — Progress Notes (Signed)
New order received to transfuse 1 unit of blood.

## 2018-06-26 NOTE — Progress Notes (Signed)
Patient hemoglobin 6.4 this morning.Text paged NP Chaney Malling awaiting a response.

## 2018-06-26 NOTE — Progress Notes (Signed)
[facility]  PROGRESS NOTE  [name] OEV:[id number] DOB: [birth year] DOA: 06/24/2018 PCP: [name], MD   Brief HPI:   85 year old male with a history of hypertension, coronary disease status post stenting to LAD in 2009, RCA 2011 who is on aspirin and Plavix came to hospital with black stool.  He denies abdominal pain.  In the ED hemoglobin was found to be 9.0.  Hemoccult was positive.  GI was consulted for EGD.   Subjective   Patient seen and examined, status post EGD yesterday which showed nonbleeding erosive gastropathy, erythematous mucosa in the gastric body, multiple nonbleeding erosions in the stomach were 3 nonbleeding duodenal ulcers with largest flat pigmented spot in the bulb.  Hemoglobin dropped this morning to 6.3, 1 unit PRBC ordered.   Assessment/Plan:     1. Melena/GI bleed-EGD showed multiple nonbleeding erosions in the stomach and 3 large nonbleeding duodenal ulcers. Continue IV Protonix infusion.  GI is following.Will advance the diet to soft   2. Anemia-secondary to GI bleed as above.  Hemoglobin checked this morning is 6.4, 1 unit PRBC ordered.  Repeat hemoglobin is 7.6.  GI has ordered 1 more unit of PRBC.  Follow CBC in a.m.    3. CKD stage III-creatinine is 1.13, at baseline.  4. Diabetes mellitus type 2-diet controlled, continue sliding scale insulin with NovoLog.  5. CAD status post CABG x4-aspirin and Plavix are on hold.  Discussed with cardiology, aspirin and Plavix can be held as long as GI feels it is absolutely necessary.  Then only monotherapy with aspirin or Plavix can be restarted.  6. GERD-continue Protonix.    Labs for a.m :CBC  CBG: Recent Labs  Lab 06/25/18 0741 06/25/18 1709 06/25/18 2144 06/26/18 0734 06/26/18 1149  GLUCAP 100* 137* 151* 123* 131*    CBC: Recent Labs  Lab 06/24/18 1141  06/24/18 1459 06/24/18 1829 06/25/18 0800 06/25/18 1249 06/26/18 0227 06/26/18 1106  WBC 10.3  --  10.2 10.4 7.6 8.2  8.7  --   NEUTROABS 7.9*  --   --   --   --   --   --   --   HGB 9.3*   < > 9.2* 9.1* 7.4* 7.8* 6.4* 7.6*  HCT 30.2*   < > 27.6* 28.8* 24.3* 24.4* 21.0* 23.5*  MCV 104.9*  --  99.3 99.7 102.1* 102.1* 101.9*  --   PLT 240  --  228 236 182 182 180  --    < > = values in this interval not displayed.    Basic Metabolic Panel: Recent Labs  Lab 06/24/18 1141 06/24/18 1148 06/24/18 1149 06/25/18 1235  NA 142 143  --  142  K 3.6 3.6  --  3.0*  CL 106  --   --  110  CO2 26  --   --  26  GLUCOSE 105*  --   --  113*  BUN 51*  --   --  26*  CREATININE 1.41*  --  1.40* 1.13  CALCIUM 8.4*  --   --  7.4*     DVT prophylaxis: SCDs  Code Status: Full code  Family Communication: Discussed with wife at bedside  Disposition Plan: likely home when medically ready for discharge     Consultants:  Gastroenterology  Procedures:  EGD   Antibiotics:   Anti-infectives (From admission, onward)   None       Objective   Vitals:   06/26/18 8299 06/26/18 0735 06/26/18 0835 06/26/18 1100  BP: (!) 143/68 (!) 166/85 127/77 130/75  Pulse: 85 88 93 95  Resp: 15  17   Temp: 98.5 F (36.9 C) 98.4 F (36.9 C) 98.2 F (36.8 C) 98.5 F (36.9 C)  TempSrc: Oral Oral Oral Oral  SpO2: 98% 97% 98%   Weight:      Height:        Intake/Output Summary (Last 24 hours) at 06/26/2018 1328 Last data filed at 06/26/2018 1017 Gross per 24 hour  Intake 1341.11 ml  Output 1000 ml  Net 341.11 ml   Filed Weights   06/24/18 1037 06/24/18 1048  Weight: 83.9 kg 83.9 kg     Physical Examination:   General: Appears in no acute distress  Cardiovascular: S1-S2, regular, no murmur auscultated  Respiratory: Clear to auscultation bilaterally, no wheezing or crackles   Abdomen: Abdomen is soft, nontender, no organomegaly  Extremities: No edema in the lower extremities  Neurologic: Alert, and x3, no focal deficit noted.     Data Reviewed: I have personally reviewed following labs and  imaging studies   Recent Results (from the past 240 hour(s))  MRSA PCR Screening     Status: None   Collection Time: 06/24/18  3:15 PM  Result Value Ref Range Status   MRSA by PCR NEGATIVE NEGATIVE Final    Comment:        The GeneXpert MRSA Assay (FDA approved for NASAL specimens only), is one component of a comprehensive MRSA colonization surveillance program. It is not intended to diagnose MRSA infection nor to guide or monitor treatment for MRSA infections. Performed at [facility], [place] [address] [place], [place] [address]      Liver Function Tests: Recent Labs  Lab 06/24/18 1141  AST 17  ALT 9  ALKPHOS 46  BILITOT 0.2*  PROT 5.3*  ALBUMIN 3.0*      Studies: No results found.  Scheduled Meds: . sodium chloride   Intravenous Once  . diclofenac sodium  2 g Topical QID  . feeding supplement  1 Container Oral TID BM  . fludrocortisone  0.1 mg Oral BID  . gabapentin  200 mg Oral BID  . insulin aspart  0-15 Units Subcutaneous TID WC  . insulin aspart  0-5 Units Subcutaneous QHS    Admission status: Inpatient: Based on patients clinical presentation and evaluation of above clinical data, I have made determination that patient meets Inpatient criteria at this time.  Time spent: 20 min  [facility] Pager [phone]. If 7PM-7AM, please contact night-coverage at [email or web address], Office  [phone]  password [email or web address]  06/26/2018, 1:28 PM  LOS: 2 days

## 2018-06-27 ENCOUNTER — Telehealth: Payer: Self-pay

## 2018-06-27 DIAGNOSIS — D62 Acute posthemorrhagic anemia: Secondary | ICD-10-CM

## 2018-06-27 DIAGNOSIS — K269 Duodenal ulcer, unspecified as acute or chronic, without hemorrhage or perforation: Secondary | ICD-10-CM

## 2018-06-27 DIAGNOSIS — K922 Gastrointestinal hemorrhage, unspecified: Secondary | ICD-10-CM

## 2018-06-27 LAB — CBC WITH DIFFERENTIAL/PLATELET
Abs Immature Granulocytes: 0.04 10*3/uL (ref 0.00–0.07)
Basophils Absolute: 0.1 10*3/uL (ref 0.0–0.1)
Basophils Relative: 1 %
Eosinophils Absolute: 0.2 10*3/uL (ref 0.0–0.5)
Eosinophils Relative: 2 %
HCT: 27.8 % — ABNORMAL LOW (ref 39.0–52.0)
Hemoglobin: 9.2 g/dL — ABNORMAL LOW (ref 13.0–17.0)
Immature Granulocytes: 1 %
Lymphocytes Relative: 25 %
Lymphs Abs: 2.1 10*3/uL (ref 0.7–4.0)
MCH: 30.9 pg (ref 26.0–34.0)
MCHC: 33.1 g/dL (ref 30.0–36.0)
MCV: 93.3 fL (ref 80.0–100.0)
Monocytes Absolute: 0.6 10*3/uL (ref 0.1–1.0)
Monocytes Relative: 7 %
Neutro Abs: 5.5 10*3/uL (ref 1.7–7.7)
Neutrophils Relative %: 64 %
Platelets: 158 10*3/uL (ref 150–400)
RBC: 2.98 MIL/uL — ABNORMAL LOW (ref 4.22–5.81)
RDW: 18.4 % — ABNORMAL HIGH (ref 11.5–15.5)
WBC: 8.5 10*3/uL (ref 4.0–10.5)
nRBC: 0 % (ref 0.0–0.2)

## 2018-06-27 LAB — TYPE AND SCREEN
ABO/RH(D): B NEG
Antibody Screen: NEGATIVE
Unit division: 0
Unit division: 0

## 2018-06-27 LAB — CBC
HCT: 27.3 % — ABNORMAL LOW (ref 39.0–52.0)
Hemoglobin: 9 g/dL — ABNORMAL LOW (ref 13.0–17.0)
MCH: 30 pg (ref 26.0–34.0)
MCHC: 33 g/dL (ref 30.0–36.0)
MCV: 91 fL (ref 80.0–100.0)
Platelets: 146 10*3/uL — ABNORMAL LOW (ref 150–400)
RBC: 3 MIL/uL — ABNORMAL LOW (ref 4.22–5.81)
RDW: 18.2 % — ABNORMAL HIGH (ref 11.5–15.5)
WBC: 8.9 10*3/uL (ref 4.0–10.5)
nRBC: 0 % (ref 0.0–0.2)

## 2018-06-27 LAB — BPAM RBC
Blood Product Expiration Date: 202003032359
Blood Product Expiration Date: 202003162359
ISSUE DATE / TIME: 202002250452
ISSUE DATE / TIME: 202002251359
Unit Type and Rh: 9500
Unit Type and Rh: 9500

## 2018-06-27 LAB — BASIC METABOLIC PANEL
Anion gap: 6 (ref 5–15)
BUN: 17 mg/dL (ref 8–23)
CO2: 27 mmol/L (ref 22–32)
Calcium: 7.4 mg/dL — ABNORMAL LOW (ref 8.9–10.3)
Chloride: 111 mmol/L (ref 98–111)
Creatinine, Ser: 1.07 mg/dL (ref 0.61–1.24)
GFR calc Af Amer: >60 mL/min (ref >60)
GFR calc non Af Amer: >60 mL/min (ref >60)
Glucose, Bld: 118 mg/dL — ABNORMAL HIGH (ref 70–99)
Potassium: 3.1 mmol/L — ABNORMAL LOW (ref 3.5–5.1)
Sodium: 144 mmol/L (ref 135–145)

## 2018-06-27 LAB — PHOSPHORUS: Phosphorus: 1.6 mg/dL — ABNORMAL LOW (ref 2.5–4.6)

## 2018-06-27 LAB — GLUCOSE, CAPILLARY
Glucose-Capillary: 108 mg/dL — ABNORMAL HIGH (ref 70–99)
Glucose-Capillary: 185 mg/dL — ABNORMAL HIGH (ref 70–99)

## 2018-06-27 LAB — MAGNESIUM: Magnesium: 1.6 mg/dL — ABNORMAL LOW (ref 1.7–2.4)

## 2018-06-27 MED ORDER — POTASSIUM CHLORIDE CRYS ER 20 MEQ PO TBCR
40.0000 meq | EXTENDED_RELEASE_TABLET | Freq: Two times a day (BID) | ORAL | Status: DC
  Administered 2018-06-27: 40 meq via ORAL
  Filled 2018-06-27: qty 2

## 2018-06-27 MED ORDER — CLOPIDOGREL BISULFATE 75 MG PO TABS: 75.0000 mg | ORAL_TABLET | Freq: Every day | ORAL | Status: DC

## 2018-06-27 MED ORDER — PANTOPRAZOLE SODIUM 40 MG PO TBEC: 40.0000 mg | DELAYED_RELEASE_TABLET | Freq: Two times a day (BID) | ORAL | 0 refills | Status: DC

## 2018-06-27 MED ORDER — MAGNESIUM SULFATE 2 GM/50ML IV SOLN
2.0000 g | Freq: Once | INTRAVENOUS | Status: AC
  Administered 2018-06-27: 2 g via INTRAVENOUS
  Filled 2018-06-27: qty 50

## 2018-06-27 MED ORDER — K PHOS MONO-SOD PHOS DI & MONO 155-852-130 MG PO TABS
500.0000 mg | ORAL_TABLET | Freq: Three times a day (TID) | ORAL | Status: DC
  Administered 2018-06-27: 500 mg via ORAL
  Filled 2018-06-27 (×3): qty 2

## 2018-06-27 MED ORDER — PANTOPRAZOLE SODIUM 40 MG PO TBEC
40.0000 mg | DELAYED_RELEASE_TABLET | Freq: Two times a day (BID) | ORAL | Status: DC
  Administered 2018-06-27: 40 mg via ORAL
  Filled 2018-06-27: qty 1

## 2018-06-27 NOTE — Discharge Summary (Signed)
Physician Discharge Summary  [name] ION:[id number] DOB: 1934/03/06 DOA: 06/24/2018  PCP: [name], MD  Admit date: 06/24/2018 Discharge date: 06/27/2018  Admitted From: Home Disposition: Home with [facility]  Recommendations for Outpatient Follow-up:  1. Follow up with PCP in 1-2 weeks 2. Follow up with GI Dr. Rush Landmark in 1-2 weeks 3. Follow up with Cardiology Dr. Stanford Breed in 1-2 weeks  4. Please obtain CMP/CBC, Mag, Phos in one week 5. Please follow up on the following pending results:  Home Health: Yes  Equipment/Devices: None recommended by PT    Discharge Condition: Stable  CODE STATUS: FULL CODE Diet recommendation: Soft Diet  Brief/Interim Summary: The patient is an 85 year old male with a history of hypertension, coronary disease status post stenting to LAD in 2009, RCA 2011 who is on aspirin and Plavix came to hospital with black stool.  He denied abdominal pain.  In the ED hemoglobin was found to be 9.0 and dropped to 6.3.  Hemoccult was positive.  GI was consulted for EGD and showed nonbleeding erosive gastropathy, erythematous mucosa in the gastric body, multiple nonbleeding erosions in the stomach were 3 nonbleeding duodenal ulcers with largest flat pigmented spot in the bulb. He was typed and screened and transfused 2 units.  Start on Protonix drip and then changed to p.o. Protonix twice daily at discharge.  He was told to hold his for least a week and is okay for him to continue his aspirin 81 mg p.o. daily.  Hemoglobin hematocrit stabilized and repeat showed the hemoglobin went up.  Patient had a small bloody bowel movement prior to discharge but GI felt it was all blood.  He was deemed medically stable to be discharged and will follow up with PCP, as well as cardiology and gastroenterology in outpatient setting.  Discharge Diagnoses:  Principal Problem:   Acute blood loss anemia Active Problems:   Coronary atherosclerosis- s/p PCI to LAD in 2009 and  PCI to RCA in 2011   GERD   S/P CABG x 4   Diabetes mellitus type 2, diet-controlled ([place])   CKD stage 3 due to type 2 diabetes mellitus ([place])   Upper GI bleed  1. Melena/GI bleed-EGD showed multiple nonbleeding erosions in the stomach and 3 large nonbleeding duodenal ulcers. Continue IV Protonix infusion and changed to Protonix 40 mg po BID.  GI is following. Patient tolerated advancement of diet to soft   2. NormocyticAnemia-secondary to GI bleed as above.  Hemoglobin checked yesterday morning was 6.3, 1 unit PRBC ordered.  Repeat hemoglobin was 7.6.  GI has ordered 1 more unit of PRBC. Repeat this Am was 9.0/27.3 and repeat this Afternoon was 9.2/27.8    3. CKD stage III-creatinine is 1.13, at baseline and improved to 1.07  4. Diabetes mellitus type 2-diet controlled, continue sliding scale insulin with NovoLog while hospitalized  5. CAD status post CABG x4-aspirin and Plavix were on hold.  Discussed with cardiology, aspirin and Plavix can be held as long as GI feels it is absolutely necessary.  Then only monotherapy with aspirin or Plavix can be restarted. GI recommending continuing ASA and resuming Plavix in 1 week.  This with the patient to follow-up with gastroenterology and cardiology in 1 week and is agreeable  6. GERD-continue Protonix changed to 40 mg p.o. twice daily for 1 month and then 40 mg p.o. daily thereafter   7. Hypokalemia-was 3.1 this point a.m. and given p.o. potassium chloride for medical twice daily 2 doses.  Is  also replete with p.o. K-Phos neutral 500 mg 3 times daily x3 doses  8. Hypomagnesemia-replete with IV mag sulfate 2 g prior to discharge; will need to follow-up as an outpatient  9. Hypophosphatemia -was 1.6 this a.m. and replete with p.o. K-Phos Neutral 500 mg 3 times daily x3 doses  10. Thrombocytopenia-improved but the count is now 158 and will need to follow-up with PCP at discharge  11.  Knee Pain - Avoid NSAIDs, Follow up with Orthopedic  Surgery   Discharge Instructions  Discharge Instructions    Call MD for:  difficulty breathing, headache or visual disturbances   Complete by:  As directed    Call MD for:  extreme fatigue   Complete by:  As directed    Call MD for:  hives   Complete by:  As directed    Call MD for:  persistant dizziness or light-headedness   Complete by:  As directed    Call MD for:  persistant nausea and vomiting   Complete by:  As directed    Call MD for:  redness, tenderness, or signs of infection (pain, swelling, redness, odor or green/yellow discharge around incision site)   Complete by:  As directed    Call MD for:  severe uncontrolled pain   Complete by:  As directed    Call MD for:  temperature >100.4   Complete by:  As directed    Diet - low sodium heart healthy   Complete by:  As directed    Soft Diet   Diet Carb Modified   Complete by:  As directed    Discharge instructions   Complete by:  As directed    You were cared for by a hospitalist during your hospital stay. If you have any questions about your discharge medications or the care you received while you were in the hospital after you are discharged, you can call the unit and ask to speak with the hospitalist on call if the hospitalist that took care of you is not available. Once you are discharged, your primary care physician will handle any further medical issues. Please note that NO REFILLS for any discharge medications will be authorized once you are discharged, as it is imperative that you return to your primary care physician (or establish a relationship with a primary care physician if you do not have one) for your aftercare needs so that they can reassess your need for medications and monitor your lab values.  Follow up with PCP, Cardiology, and Gastroenterology in the outpatient setting. Take all medications as prescribed. If symptoms change or worsen please return to the ED for evaluation   Increase activity slowly   Complete  by:  As directed      Allergies as of 06/27/2018      Reactions   Cymbalta [duloxetine Hcl] Other (See Comments)   Dizziness   Keflex [cephalexin] Other (See Comments)   dizziness   Simvastatin Other (See Comments)   Joint pain   Sudafed [pseudoephedrine] Other (See Comments)   Dizziness      Medication List    STOP taking these medications   diclofenac sodium 1 % Gel Commonly known as:  VOLTAREN   olmesartan 40 MG tablet Commonly known as:  BENICAR   ONE TOUCH ULTRA 2 w/Device Kit     TAKE these medications   acetaminophen 500 MG tablet Commonly known as:  TYLENOL Take 1,000 mg by mouth 2 (two) times daily as needed for headache (pain).  aspirin 81 MG EC tablet TAKE 1 TABLET BY MOUTH EVERY DAY   butalbital-acetaminophen-caffeine 50-325-40 MG tablet Commonly known as:  FIORICET, ESGIC Take 1 tablet every 4 hours ONLY if needed for severe Headache What changed:    how much to take  how to take this  when to take this  reasons to take this  additional instructions   CALCIUM+D3 PO Take 1 tablet by mouth daily.   clopidogrel 75 MG tablet Commonly known as:  PLAVIX Take 1 tablet (75 mg total) by mouth at bedtime. Start taking on:  July 04, 2018 What changed:    when to take this  These instructions start on July 04, 2018. If you are unsure what to do until then, ask your doctor or other care provider.   FISH OIL OMEGA-3 PO Take 1 capsule by mouth daily.   fludrocortisone 0.1 MG tablet Commonly known as:  FLORINEF Take 0.1 mg by mouth 2 (two) times daily.   gabapentin 100 MG capsule Commonly known as:  NEURONTIN TAKE 2 CAPSULE(200 MG) BY MOUTH 2 TIMES A DAY. What changed:    how much to take  how to take this  when to take this  additional instructions   glucose blood test strip Check blood sugar 1 time daily-DX-E11.9   multivitamin with minerals Tabs tablet Take 1 tablet by mouth daily.   pantoprazole 40 MG tablet Commonly known  as:  PROTONIX Take 1 tablet (40 mg total) by mouth 2 (two) times daily. Take BID for a Month and then 40 mg Daily thereafter   polyethylene glycol packet Commonly known as:  MIRALAX / GLYCOLAX Take 17 g by mouth daily. What changed:  when to take this   traMADol 50 MG tablet Commonly known as:  ULTRAM Take 1 tab up to every 6 hours as needed for severe pain. What changed:    how much to take  how to take this  when to take this  reasons to take this  additional instructions   Vitamin D-3 125 MCG (5000 UT) Tabs Take 5,000 Units by mouth daily.      Follow-up Information    [name], MD Follow up.   Specialty:  Internal Medicine Why:  Follow up within 1 week Contact information: [address] [place] [address] [phone]        Lelon Perla, MD. Call.   Specialty:  Cardiology Why:  Follow up within 1 week Contact information: [address] [place] [address] [phone]        Mansouraty, Telford Nab., MD. Call.   Specialties:  Gastroenterology, Internal Medicine Why:  Follow up as an outpatient within 1-2 weeks and consider Repeat EGD in outpatient setting Contact information: [place] [address] [phone]          Allergies  Allergen Reactions  . Cymbalta [Duloxetine Hcl] Other (See Comments)    Dizziness  . Keflex [Cephalexin] Other (See Comments)    dizziness  . Simvastatin Other (See Comments)    Joint pain  . Sudafed [Pseudoephedrine] Other (See Comments)    Dizziness   Consultations:  Gastroenterology  Procedures/Studies: Xr Hip Unilat W Or W/o Pelvis 2-3 Views Right  Result Date: 06/07/2018 AP pelvis lateral view of the right hip: Bilateral hips well located.  No acute fractures.  Hip joints well maintained.  Generative changes of the lower lumbar spine are seen on the AP view of the pelvis.  Xr Knee  1-2 Views Right  Result Date: 06/07/2018 AP lateral views  right knee: Near bone-on-bone medial compartment.  Moderate patellofemoral arthritic changes.  No acute fracture subluxation dislocation knee.  Ossified calcifications posterior medial aspect of the knee consistent with intra-articular loose bodies are unchanged from prior films in 2018.  No acute findings otherwise.   EGD Findings:      No gross lesions were noted in the entire esophagus. Biopsies were taken       with a cold forceps for histology to rule out EoE.      Multiple dispersed, 1 to 6 mm non-bleeding erosions were found in the       entire examined stomach (linear as well as oval). There were stigmata of       recent bleeding.      Striped moderately erythematous mucosa without bleeding was found in the       gastric body. Biopsies were taken with a cold forceps for histology and       Helicobacter pylori testing from the entire stomach.      Three non-bleeding cratered duodenal ulcers were found in the duodenal       bulb, and in the D1/D2 angle-sweep of the duodenum. The largest lesion       was 18 mm in largest dimension in the bulb. The largest ulcer had a a       flat pigmented spot (Forrest Class IIc). Due to recent Plavix use, there       is a relative contraindication to Gold-Probe Bipolar cautery, so I       elected to hold on that currently.      No gross lesions were noted in the second portion of the duodenum. Impression:               - No gross lesions in esophagus. Biopsied.                           - Non-bleeding erosive gastropathy.                           - Erythematous mucosa in the gastric body. Biopsied.                           - Multiple non-bleeding duodenal ulcers (bulb &                            D1/D2 sweep) with largest having a flat pigmented                            spot (Forrest Class IIc) in the bulb.                           - No gross lesions in the second portion of the                            duodenum.  Subjective: Seen and  Examined at bedside was doing well.  Had a bloody movement prior to discharge was maroon with some bright red however after repeat hemoglobin/hematocrit and hemoglobin actually improved.  GI felt this was all blood and he is cleared for discharge.  He denies any  other complaints or concerns about pain.  He will follow with PCP, cardiology, as well as gastroenterology in outpatient setting  Discharge Exam: Vitals:   06/27/18 0804 06/27/18 1151  BP: (!) 162/77 (!) 146/72  Pulse: 78 81  Resp: 15 20  Temp: 97.8 F (36.6 C) 97.7 F (36.5 C)  SpO2: 97% 98%   Vitals:   06/27/18 0026 06/27/18 0416 06/27/18 0804 06/27/18 1151  BP: (!) 157/90 (!) 145/80 (!) 162/77 (!) 146/72  Pulse: 100 87 78 81  Resp: '18 17 15 20  ' Temp: 98 F (36.7 C) 98.2 F (36.8 C) 97.8 F (36.6 C) 97.7 F (36.5 C)  TempSrc: Oral Oral Oral Oral  SpO2: 96% 97% 97% 98%  Weight:      Height:       General: Pt is alert, awake, not in acute distress Cardiovascular: RRR, S1/S2 +, no rubs, no gallops Respiratory: CTA bilaterally, no wheezing, no rhonchi Abdominal: Soft, NT, ND, bowel sounds + Extremities: no edema, no cyanosis  The results of significant diagnostics from this hospitalization (including imaging, microbiology, ancillary and laboratory) are listed below for reference.    Microbiology: Recent Results (from the past 240 hour(s))  MRSA PCR Screening     Status: None   Collection Time: 06/24/18  3:15 PM  Result Value Ref Range Status   MRSA by PCR NEGATIVE NEGATIVE Final    Comment:        The GeneXpert MRSA Assay (FDA approved for NASAL specimens only), is one component of a comprehensive MRSA colonization surveillance program. It is not intended to diagnose MRSA infection nor to guide or monitor treatment for MRSA infections. Performed at [facility], [place] [address] [place], [place] [address]      Labs: BNP (last 3 results) No results for input(s): BNP in the last 8760 hours. Basic  Metabolic Panel: Recent Labs  Lab 06/24/18 1141 06/24/18 1148 06/24/18 1149 06/25/18 1235 06/27/18 0308  NA 142 143  --  142 144  K 3.6 3.6  --  3.0* 3.1*  CL 106  --   --  110 111  CO2 26  --   --  26 27  GLUCOSE 105*  --   --  113* 118*  BUN 51*  --   --  26* 17  CREATININE 1.41*  --  1.40* 1.13 1.07  CALCIUM 8.4*  --   --  7.4* 7.4*  MG  --   --   --   --  1.6*  PHOS  --   --   --   --  1.6*   Liver Function Tests: Recent Labs  Lab 06/24/18 1141  AST 17  ALT 9  ALKPHOS 46  BILITOT 0.2*  PROT 5.3*  ALBUMIN 3.0*   No results for input(s): LIPASE, AMYLASE in the last 168 hours. No results for input(s): AMMONIA in the last 168 hours. CBC: Recent Labs  Lab 06/24/18 1141  06/25/18 0800 06/25/18 1249 06/26/18 0227 06/26/18 1106 06/26/18 1808 06/27/18 0308 06/27/18 1514  WBC 10.3   < > 7.6 8.2 8.7  --   --  8.9 8.5  NEUTROABS 7.9*  --   --   --   --   --   --   --  5.5  HGB 9.3*   < > 7.4* 7.8* 6.4* 7.6* 8.6* 9.0* 9.2*  HCT 30.2*   < > 24.3* 24.4* 21.0* 23.5* 26.3* 27.3* 27.8*  MCV 104.9*   < > 102.1*  102.1* 101.9*  --   --  91.0 93.3  PLT 240   < > 182 182 180  --   --  146* 158   < > = values in this interval not displayed.   Cardiac Enzymes: No results for input(s): CKTOTAL, CKMB, CKMBINDEX, TROPONINI in the last 168 hours. BNP: Invalid input(s): POCBNP CBG: Recent Labs  Lab 06/26/18 1149 06/26/18 1652 06/26/18 2108 06/27/18 0805 06/27/18 1149  GLUCAP 131* 134* 114* 108* 185*   D-Dimer No results for input(s): DDIMER in the last 72 hours. Hgb A1c No results for input(s): HGBA1C in the last 72 hours. Lipid Profile No results for input(s): CHOL, HDL, LDLCALC, TRIG, CHOLHDL, LDLDIRECT in the last 72 hours. Thyroid function studies No results for input(s): TSH, T4TOTAL, T3FREE, THYROIDAB in the last 72 hours.  Invalid input(s): FREET3 Anemia work up No results for input(s): VITAMINB12, FOLATE, FERRITIN, TIBC, IRON, RETICCTPCT in the last 72  hours. Urinalysis    Component Value Date/Time   COLORURINE YELLOW 01/30/2018 [place] 01/30/2018 1454   LABSPEC 1.021 01/30/2018 1454   PHURINE 5.5 01/30/2018 1454   GLUCOSEU NEGATIVE 01/30/2018 1454   HGBUR NEGATIVE 01/30/2018 1454   BILIRUBINUR NEGATIVE 05/17/2017 0748   KETONESUR NEGATIVE 01/30/2018 1454   PROTEINUR NEGATIVE 01/30/2018 1454   UROBILINOGEN 1.0 01/05/2015 1550   NITRITE NEGATIVE 01/30/2018 1454   LEUKOCYTESUR NEGATIVE 01/30/2018 1454   Sepsis Labs Invalid input(s): PROCALCITONIN,  WBC,  LACTICIDVEN Microbiology Recent Results (from the past 240 hour(s))  MRSA PCR Screening     Status: None   Collection Time: 06/24/18  3:15 PM  Result Value Ref Range Status   MRSA by PCR NEGATIVE NEGATIVE Final    Comment:        The GeneXpert MRSA Assay (FDA approved for NASAL specimens only), is one component of a comprehensive MRSA colonization surveillance program. It is not intended to diagnose MRSA infection nor to guide or monitor treatment for MRSA infections. Performed at [facility], [place] [address], [place], [place] [address]    Time coordinating discharge: 35 minutes  SIGNED:  Kerney Elbe, DO [facility] 06/27/2018, 8:34 PM Pager is on [place]  If 7PM-7AM, please contact night-coverage [email or web address] Password [email or web address]

## 2018-06-27 NOTE — Telephone Encounter (Signed)
3/31 at 945 am appt with Dr Rush Landmark

## 2018-06-27 NOTE — Telephone Encounter (Signed)
-----   Message from Yetta Flock, MD sent at 06/27/2018  8:30 AM EST ----- Regarding: follow up HI [name], This patient is being discharged today. Needs follow up with Dr. Jerilynn Mages in 4-6 weeks if you can help coordinate.  Thanks

## 2018-06-27 NOTE — Care Management Important Message (Signed)
Important Message  Patient Details  Name: [name] MRN: [id number] Date of Birth: 1934-03-02   Medicare Important Message Given:  Yes    Keymiah Lyles Montine Circle 06/27/2018, 3:37 PM

## 2018-06-27 NOTE — Care Management Note (Addendum)
Case Management Note  Patient Details  Name: [name] MRN: [id number] Date of Birth: 1933-10-06  Subjective/Objective:  From home with spouse, for possible dc today will need HHPT, NCM offered choice, wife chose [facility] , referral given to [facility], for [name], he was able to take referral. Soc will begin 24-48 hrs post dc.  Will need HHPT order with face to face prior to dc.    DME- has walker , crutches, bsc at home PCP  - [facility] in [place]- wife.   Meds- no problem getting.           Action/Plan: Need HHPT order with face to face prior to dc.   Expected Discharge Date:                  Expected Discharge Plan:  [place]  In-House Referral:     Discharge planning Services     Post Acute Care Choice:  Home Health Choice offered to:  Spouse  DME Arranged:    DME Agency:     HH Arranged:  PT HH Agency:  [facility]  Status of Service:  Completed, signed off  If discussed at [place] of Stay Meetings, dates discussed:    Additional Comments:  Zenon Mayo, RN 06/27/2018, 12:36 PM

## 2018-06-27 NOTE — Progress Notes (Signed)
      Progress Note   Subjective  No further bleeding symptoms. Tolerating regular diet. Feels well. Only complaint is knee pain from being in bed. Wants to go home.   Objective   Vital signs in last 24 hours: Temp:  [97.8 F (36.6 C)-98.5 F (36.9 C)] 97.8 F (36.6 C) (02/26 0804) Pulse Rate:  [78-100] 78 (02/26 0804) Resp:  [15-22] 15 (02/26 0804) BP: (127-170)/(74-90) 162/77 (02/26 0804) SpO2:  [96 %-100 %] 97 % (02/26 0804) Last BM Date: 06/26/18 General:    white male in NAD Heart:  Regular rate and rhythm; no murmurs Lungs: Respirations even and unlabored, Abdomen:  Soft, nontender and nondistended.  Extremities:  Without edema. Neurologic:  Alert and oriented,  grossly normal neurologically. Psych:  Cooperative. Normal mood and affect.  Intake/Output from previous day: 02/25 0701 - 02/26 0700 In: 2098.8 [P.O.:240; I.V.:1488.8; Blood:370] Out: 550 [Urine:550] Intake/Output this shift: No intake/output data recorded.  Lab Results: Recent Labs    06/25/18 1249 06/26/18 0227 06/26/18 1106 06/26/18 1808 06/27/18 0308  WBC 8.2 8.7  --   --  8.9  HGB 7.8* 6.4* 7.6* 8.6* 9.0*  HCT 24.4* 21.0* 23.5* 26.3* 27.3*  PLT 182 180  --   --  146*   BMET Recent Labs    06/24/18 1141 06/24/18 1148 06/24/18 1149 06/25/18 1235 06/27/18 0308  NA 142 143  --  142 144  K 3.6 3.6  --  3.0* 3.1*  CL 106  --   --  110 111  CO2 26  --   --  26 27  GLUCOSE 105*  --   --  113* 118*  BUN 51*  --   --  26* 17  CREATININE 1.41*  --  1.40* 1.13 1.07  CALCIUM 8.4*  --   --  7.4* 7.4*   LFT Recent Labs    06/24/18 1141  PROT 5.3*  ALBUMIN 3.0*  AST 17  ALT 9  ALKPHOS 46  BILITOT 0.2*   PT/INR No results for input(s): LABPROT, INR in the last 72 hours.  Studies/Results: No results found.     Assessment / Plan:    85 y/o male with history of CAD, stents / CABG / CVA on aspirin and plavix admitted with upper GI bleed. EGD 2/24 showed large duodenal ulcers, one of  which had pigmented spot, as well as erosive gastritis. Biopsies for H pylori negative. He has responded appropriately to RBC transfusion, he's had no further symptoms, BUN downtrended to normal, he is not having any further bleeding.   Recommend: - okay to go home today from bleeding perspective - would hold Plavix for one week, okay to resume aspirin 81mg  given significant CAD and history of CVA - continue protonix 40mg  PO BID for one month, then once daily thereafter - will coordinate follow up with Dr. Rush Landmark as outpatient. Consideration for repeat EGD as outpatient. - please hold all NSAIDs other than baby aspirin, recommend tylenol for knee pain.  Call with questions.  Ramsey Cellar, MD [facility]

## 2018-06-27 NOTE — Progress Notes (Signed)
Physical Therapy Treatment Patient Details Name: [name] MRN: [id number] DOB: [birth year] Today's Date: 06/27/2018    History of Present Illness Patient is 85 y/o male admitted to hospital with acute blood loss anemia secondary to GI bleed. Patient is s/p EGD revealing multiple nonbleeding erosions in the stomach, 3 large nonbleeding duodenal ulcers. PMH includes CAD s/p CABG, CKD, DMII, HTN, GERD, and IBS.     PT Comments    Patient ambulating with supervision and use of RW. educated wife on proper guarding and to urge use of RW to prevent falls at home while he works with home physical therapy as he has notable balance deficits.    Follow Up Recommendations  Home health PT;Supervision/Assistance - 24 hour     Equipment Recommendations  None recommended by PT    Recommendations for Other Services       Precautions / Restrictions Precautions Precautions: Fall Restrictions Weight Bearing Restrictions: No    Mobility  Bed Mobility Overal bed mobility: Needs Assistance Bed Mobility: Supine to Sit     Supine to sit: Supervision     General bed mobility comments: Patient required supervision for bed mobility for safety.   Transfers Overall transfer level: Needs assistance Equipment used: Rolling walker (2 wheeled) Transfers: Sit to/from Stand Sit to Stand: Supervision            Ambulation/Gait Ambulation/Gait assistance: Supervision Gait Distance (Feet): 30 Feet Assistive device: Rolling walker (2 wheeled) Gait Pattern/deviations: Step-through pattern;Decreased step length - right;Decreased step length - left;Decreased stride length Gait velocity: decreased   General Gait Details: ambulating aroud room and into bathroom to have bowl movement. safer with RW. wife present discussed proper guarding    Stairs             Wheelchair Mobility    Modified Rankin (Stroke Patients Only)       Balance Overall balance assessment: Needs  assistance Sitting-balance support: No upper extremity supported;Feet supported Sitting balance-Leahy Scale: Good     Standing balance support: Bilateral upper extremity supported Standing balance-Leahy Scale: Poor Standing balance comment: reliant on BUE support to maintain standing balance                            Cognition Arousal/Alertness: Awake/alert Behavior During Therapy: WFL for tasks assessed/performed Overall Cognitive Status: Within Functional Limits for tasks assessed                                        Exercises      General Comments        Pertinent Vitals/Pain Pain Assessment: 0-10 Pain Score: 6  Pain Location: R knee and back Pain Descriptors / Indicators: Aching;Discomfort Pain Intervention(s): Limited activity within patient's tolerance;Monitored during session    Home Living                      Prior Function            PT Goals (current goals can now be found in the care plan section) Acute Rehab PT Goals Patient Stated Goal: go home PT Goal Formulation: With patient Time For Goal Achievement: 07/10/18 Progress towards PT goals: Progressing toward goals    Frequency    Min 3X/week      PT Plan Current plan remains appropriate    Co-evaluation  AM-PAC PT "6 Clicks" Mobility   Outcome Measure  Help needed turning from your back to your side while in a flat bed without using bedrails?: None Help needed moving from lying on your back to sitting on the side of a flat bed without using bedrails?: None Help needed moving to and from a bed to a chair (including a wheelchair)?: A Little Help needed standing up from a chair using your arms (e.g., wheelchair or bedside chair)?: A Little Help needed to walk in hospital room?: A Little Help needed climbing 3-5 steps with a railing? : A Lot 6 Click Score: 19    End of Session Equipment Utilized During Treatment: Gait belt Activity  Tolerance: Patient tolerated treatment well Patient left: in bed;with call bell/phone within reach;with family/visitor present;with nursing/sitter in room Nurse Communication: Mobility status PT Visit Diagnosis: Unsteadiness on feet (R26.81);Muscle weakness (generalized) (M62.81);Pain;Difficulty in walking, not elsewhere classified (R26.2) Pain - Right/Left: Right Pain - part of body: Knee     Time: 1445-1500 PT Time Calculation (min) (ACUTE ONLY): 15 min  Charges:  $Gait Training: 8-22 mins                    Reinaldo Berber, PT, DPT Acute Rehabilitation Services Pager: [phone] Office: [place] 06/27/2018, 3:15 PM

## 2018-06-28 ENCOUNTER — Ambulatory Visit: Payer: Self-pay | Admitting: Adult Health

## 2018-06-28 ENCOUNTER — Encounter: Payer: Self-pay | Admitting: Gastroenterology

## 2018-06-28 ENCOUNTER — Telehealth: Payer: Self-pay | Admitting: Adult Health

## 2018-06-28 DIAGNOSIS — K259 Gastric ulcer, unspecified as acute or chronic, without hemorrhage or perforation: Secondary | ICD-10-CM | POA: Diagnosis not present

## 2018-06-28 DIAGNOSIS — E1122 Type 2 diabetes mellitus with diabetic chronic kidney disease: Secondary | ICD-10-CM | POA: Diagnosis not present

## 2018-06-28 DIAGNOSIS — N183 Chronic kidney disease, stage 3 (moderate): Secondary | ICD-10-CM | POA: Diagnosis not present

## 2018-06-28 DIAGNOSIS — K269 Duodenal ulcer, unspecified as acute or chronic, without hemorrhage or perforation: Secondary | ICD-10-CM | POA: Diagnosis not present

## 2018-06-28 DIAGNOSIS — I129 Hypertensive chronic kidney disease with stage 1 through stage 4 chronic kidney disease, or unspecified chronic kidney disease: Secondary | ICD-10-CM | POA: Diagnosis not present

## 2018-06-28 NOTE — Telephone Encounter (Signed)
Patient has follow up office visit. Knows to call with any questions or concerns.   

## 2018-06-28 NOTE — Telephone Encounter (Signed)
Called patient on 06/28/2018 , 9:47 AM in an attempt to reach the patient for a hospital follow up.   Admit date: 06/24/18 Discharge: 06/27/18   He does not have any questions or concerns about medications from the hospital admission. The patient's medications were reviewed over the phone, they were counseled to bring in all current medications to the hospital follow up visit.   I advised the patient to call if any questions or concerns arise about the hospital admission or medications    Home health was not started in the hospital.  All questions were answered and a follow up appointment was made.   Prior to Admission medications   Medication Sig Start Date End Date Taking? Authorizing Provider  acetaminophen (TYLENOL) 500 MG tablet Take 1,000 mg by mouth 2 (two) times daily as needed for headache (pain).     [provider]  aspirin 81 MG EC tablet TAKE 1 TABLET BY MOUTH EVERY DAY Patient taking differently: Take 81 mg by mouth daily.  05/11/18   Venancio Poisson, NP  butalbital-acetaminophen-caffeine (FIORICET, ESGIC) 50-325-40 MG tablet Take 1 tablet every 4 hours ONLY if needed for severe Headache Patient taking differently: Take 1 tablet by mouth daily as needed (severe headache).  04/27/18   [name], MD  Calcium Carb-Cholecalciferol (CALCIUM+D3 PO) Take 1 tablet by mouth daily.    [provider]  Cholecalciferol (VITAMIN D-3) 5000 units TABS Take 5,000 Units by mouth daily.    [provider]  clopidogrel (PLAVIX) 75 MG tablet Take 1 tablet (75 mg total) by mouth at bedtime. 07/04/18 07/04/19  Raiford Noble Latif, DO  fludrocortisone (FLORINEF) 0.1 MG tablet Take 0.1 mg by mouth 2 (two) times daily.     [provider]  gabapentin (NEURONTIN) 100 MG capsule TAKE 2 CAPSULE(200 MG) BY MOUTH 2 TIMES A DAY. Patient taking differently: Take 200 mg by mouth 2 (two) times daily.  10/06/17   [name], MD  glucose blood test strip Check blood  sugar 1 time daily-DX-E11.9 07/07/17   [name], MD  Multiple Vitamin (MULTIVITAMIN WITH MINERALS) TABS tablet Take 1 tablet by mouth daily.    [provider]  Omega-3 Fatty Acids (FISH OIL OMEGA-3 PO) Take 1 capsule by mouth daily.    [provider]  pantoprazole (PROTONIX) 40 MG tablet Take 1 tablet (40 mg total) by mouth 2 (two) times daily. Take BID for a Month and then 40 mg Daily thereafter 06/27/18   Raiford Noble Latif, DO  polyethylene glycol [facility] / GLYCOLAX) packet Take 17 g by mouth daily. Patient taking differently: Take 17 g by mouth 2 (two) times daily.  05/22/17   Barton Dubois, MD  traMADol (ULTRAM) 50 MG tablet Take 1 tab up to every 6 hours as needed for severe pain. Patient taking differently: Take 50 mg by mouth at bedtime as needed (pain).  06/18/18   Liane Comber, NP

## 2018-07-01 LAB — CUP PACEART REMOTE DEVICE CHECK
Date Time Interrogation Session: 20200301134202
Implantable Pulse Generator Implant Date: 20191021

## 2018-07-02 ENCOUNTER — Ambulatory Visit (INDEPENDENT_AMBULATORY_CARE_PROVIDER_SITE_OTHER): Payer: Medicare Other | Admitting: *Deleted

## 2018-07-02 DIAGNOSIS — I639 Cerebral infarction, unspecified: Secondary | ICD-10-CM | POA: Diagnosis not present

## 2018-07-03 NOTE — Progress Notes (Signed)
Hospital follow up  Assessment and Plan: Hospital visit follow up for: GI bleed with anemia  [name] was seen today for hospitalization follow-up.  Diagnoses and all orders for this visit:  Upper GI bleed Patient denies further episodes of dark or bloody stools Tolerating diet progression  Remain off of aspirin/plavix until follow up with cardiology  Continue protonix 40 mg BID Follow up GI as scheduled   CKD stage 3 due to type 2 diabetes mellitus (HCC) Increase fluids, avoid NSAIDS, monitor sugars, will monitor -     COMPLETE METABOLIC PANEL WITH GFR  Acute blood loss anemia Secondary to GI bleed; s/p 2 units PRBC Follow up levels today; check iron levels if hgb/hct not recovering sufficiently -     CBC with Differential/Platelet  Hypokalemia Currently on supplement, unknown dose; patient will call back to clarify  -     COMPLETE METABOLIC PANEL WITH GFR  Hypomagnesemia On 1000 mg daily supplement; recheck levels -     Magnesium  Hypophosphatemia Replaced during recent admission; follow up on levels  -     Phosphorus  Thrombocytopenia (HCC) -     CBC with Differential/Platelet  Gastroesophageal reflux disease, esophagitis presence not specified Well managed on current medications; continue protonix 40 mg BID as recommended by GI Follow up Dr. Rush Landmark as scheduled Discussed diet, avoiding triggers and other lifestyle changes   All medications were reviewed with patient and family and fully reconciled. All questions answered fully, and patient and family members were encouraged to call the office with any further questions or concerns. Discussed goal to avoid readmission related to this diagnosis.   Medications Discontinued During This Encounter  Medication Reason  . traMADol (ULTRAM) 50 MG tablet Patient Preference    Over 40 minutes of exam, counseling, chart review, and complex, high/moderate level critical decision making was performed this visit.    Future Appointments  Date Time Provider [place]  07/10/2018  2:00 PM Magnus Sinning, MD PO-PHY None  07/18/2018  4:00 PM Ilean China [facility]  07/23/2018  9:45 AM Mcarthur Rossetti, MD PO-NW None  07/25/2018 11:40 AM Meredith Staggers, MD CPR-PRMA CPR  07/31/2018  9:45 AM Mansouraty, Telford Nab., MD L[facility]I-GI [facility]  08/15/2018 11:30 AM [name], MD GAAM-GAAIM None  09/07/2018  8:00 AM Venancio Poisson, NP GNA-GNA None  12/03/2018  2:45 PM Venancio Poisson, NP GNA-GNA None  02/20/2019  2:00 PM [name], MD GAAM-GAAIM None  05/29/2019  3:00 PM Liane Comber, NP GAAM-GAAIM None     HPI 85 y.o.male presents for follow up for transition from recent hospitalization or SNIF stay. Admit date to the hospital was 06/24/18, patient was discharged from the hospital on 06/27/18 and our clinical staff contacted the office the day after discharge to set up a follow up appointment. The discharge summary, medications, and diagnostic test results were reviewed before meeting with the patient. The patient was admitted for: GI bleed with anemia  Brief/Interim Summary: The patient is an 85 year old male with a history of hypertension, coronary disease status post stenting to LAD in 2009, RCA 2011 who is on aspirin and Plavix presented to hospital with black stool. He denied abdominal pain. In the ED hemoglobin was found to be 9.0 and dropped to 6.3. Hemoccult was positive. GI was consulted for EGD and showed nonbleeding erosive gastropathy, erythematous mucosa in the gastric body, multiple nonbleeding erosions in the stomach were 3 nonbleeding duodenal ulcers with largest flat pigmented spot in the bulb.  He was typed and screened and transfused 2 units.  Started on Protonix drip and then changed to p.o. Protonix twice daily at discharge.  He was told to hold his for least a week and is okay for him to continue his aspirin 81 mg p.o. daily.  Hemoglobin  hematocrit stabilized and repeat showed the hemoglobin went up.  Patient had a small bloody bowel movement prior to discharge but GI felt it was all blood.  He was deemed medically stable to be discharged with plan to follow up with PCP (our office), as well as cardiology and gastroenterology in outpatient setting.  Melena/GI bleed-EGD showed multiple nonbleeding erosions in the stomach and 3 large nonbleeding duodenal ulcers.Treated with IV Protonix infusion and transitioned to Protonix 40 mg po BID.GI is following. Patient tolerated advancement of diet to soft  NormocyticAnemia-secondary to GI bleed as above. He was transfused with 2 units PRBC.  CBC Latest Ref Rng & Units 06/27/2018 06/27/2018 06/26/2018  WBC 4.0 - 10.5 K/uL 8.5 8.9 -  Hemoglobin 13.0 - 17.0 g/dL 9.2(L) 9.0(L) 8.6(L)  Hematocrit 39.0 - 52.0 % 27.8(L) 27.3(L) 26.3(L)  Platelets 150 - 400 K/uL 158 146(L) -   CKD stage III-creatinine is 1.13, at baseline and improved to 1.07 Lab Results  Component Value Date   CREATININE 1.07 06/27/2018   CREATININE 1.13 06/25/2018   CREATININE 1.40 (H) 06/24/2018   CAD status post CABG x4-aspirin and Plavix were on hold.Cardiology recommended continue off aspirin and Plavix can be held as long as GI feels it is absolutely necessary. Then only monotherapy with aspirin or Plavix can be restarted. GI recommending continuing ASA and resuming Plavix in 1 week. Patient has follow up scheduled with GI and cardiology to discuss.   GERD-continue Protonix changed to 40 mg p.o. twice daily for 1 month and then 40 mg p.o. daily thereafter   Hypokalemia-was 3.1 at time of discharge; was given p.o. potassium chloride for medical twice daily 2 doses.  Is also replete with p.o. K-Phos neutral 500 mg 3 times daily x3 doses  Hypomagnesemia-replete with IV mag sulfate 2 g prior to discharge; will need to follow-up as an outpatient  Hypophosphatemia -was 1.6 at discharge and replete with p.o. K-Phos  Neutral 500 mg 3 times daily x3 doses  Thrombocytopenia-improved but the count at discharge 158  CBC Latest Ref Rng & Units 06/27/2018 06/27/2018 06/26/2018  WBC 4.0 - 10.5 K/uL 8.5 8.9 -  Hemoglobin 13.0 - 17.0 g/dL 9.2(L) 9.0(L) 8.6(L)  Hematocrit 39.0 - 52.0 % 27.8(L) 27.3(L) 26.3(L)  Platelets 150 - 400 K/uL 158 146(L) -   Knee Pain - Avoid NSAIDs, Follow up with Orthopedic Surgery    He presents today accompanied by his wife reporting he is doing well, tolerating soft diet, has remained off of aspirin and plavix per recommendation. He has some fatigue, but denies chest pain, dyspnea, palpitations, dizziness. He has continued with protonix 40 mg BID per discharge instructions and reports doing well, denies abdominal pain, hematochezia, melena, constipation (doing well with miralax), dirrhea, bloating, nausea.   He has follow up scheduled with Dr. Jacalyn Lefevre PA Kerin Ransom on 3/13, follow up scheduled with Dr. Rush Landmark on 3/31.   He is taking magnesium 1000 mg daily, on daily potassium supplement but unsure of dose. Will recheck levels today.   Since our last visit, he has been seen by Dr. Ninfa Linden and recommneded TKA for R knee for bone on bone arthritis, has epidural scheduled next week for pain management. He  is utilizing walker at this time due to knee pain. He also plans to follow up with Dr. Tessa Lerner (pain management) to discuss nerve ablation for persistent thoracic neuropathy (has tried gabapentin but ineffective at doses he can tolerate without SE).    Home health is not involved.   Images while in the hospital: No results found.   Current Outpatient Medications (Endocrine & Metabolic):  .  fludrocortisone (FLORINEF) 0.1 MG tablet, Take 0.1 mg by mouth 2 (two) times daily.     Current Outpatient Medications (Analgesics):  .  acetaminophen (TYLENOL) 500 MG tablet, Take 1,000 mg by mouth 2 (two) times daily as needed for headache (pain).  .   butalbital-acetaminophen-caffeine (FIORICET, ESGIC) 50-325-40 MG tablet, Take 1 tablet every 4 hours ONLY if needed for severe Headache (Patient taking differently: Take 1 tablet by mouth daily as needed (severe headache). ) .  aspirin 81 MG EC tablet, TAKE 1 TABLET BY MOUTH EVERY DAY (Patient not taking: No sig reported)  Current Outpatient Medications (Hematological):  .  clopidogrel (PLAVIX) 75 MG tablet, Take 1 tablet (75 mg total) by mouth at bedtime. (Patient not taking: Reported on 07/04/2018)  Current Outpatient Medications (Other):  Marland Kitchen  Calcium Carb-Cholecalciferol (CALCIUM+D3 PO), Take 1 tablet by mouth daily. .  Cholecalciferol (VITAMIN D-3) 5000 units TABS, Take 5,000 Units by mouth daily. Marland Kitchen  gabapentin (NEURONTIN) 100 MG capsule, TAKE 2 CAPSULE(200 MG) BY MOUTH 2 TIMES A DAY. (Patient taking differently: Take 200 mg by mouth 2 (two) times daily. ) .  glucose blood test strip, Check blood sugar 1 time daily-DX-E11.9 .  MAGNESIUM PO, Take 1,000 mg by mouth daily. .  Multiple Vitamin (MULTIVITAMIN WITH MINERALS) TABS tablet, Take 1 tablet by mouth daily. .  Omega-3 Fatty Acids (FISH OIL OMEGA-3 PO), Take 1 capsule by mouth daily. .  pantoprazole (PROTONIX) 40 MG tablet, Take 1 tablet (40 mg total) by mouth 2 (two) times daily. Take BID for a Month and then 40 mg Daily thereafter .  polyethylene glycol (MIRALAX / GLYCOLAX) packet, Take 17 g by mouth daily. (Patient taking differently: Take 17 g by mouth 2 (two) times daily. ) .  POTASSIUM CHLORIDE PO, Take by mouth daily.  Past Medical History:  Diagnosis Date  . 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 Colon polyps   . Coronary atherosclerosis of unspecified type of vessel, native or graft   . Diabetes ([place])   . Difficult intubation   . Esophageal reflux   . High cholesterol   . History of IBS 02/27/2009  . Hypertension    pt denies, he says he has a h/o hypotension. If BP up he adjusts the Florinef  . Orthostatic hypotension    "BP  has been dropping alot when I stand up for the last month or so" (02/17/2016)  . Vitamin B 12 deficiency   . Vitamin D deficiency      Allergies  Allergen Reactions  . Cymbalta [Duloxetine Hcl] Other (See Comments)    Dizziness  . Keflex [Cephalexin] Other (See Comments)    dizziness  . Simvastatin Other (See Comments)    Joint pain  . Sudafed [Pseudoephedrine] Other (See Comments)    Dizziness    ROS: all negative except above.   Physical Exam: Filed Weights   07/04/18 0843  Weight: 186 lb (84.4 kg)   BP 138/78   Pulse 81   Temp 97.7 F (36.5 C)   Ht 6' (1.829 m)   Wt 186 lb (  84.4 kg)   SpO2 96%   BMI 25.23 kg/m  General Appearance: Well nourished, in no apparent distress. Eyes: PERRLA, conjunctiva no swelling or erythema, MM pale  ENT/Mouth: No erythema, swelling, or exudate on post pharynx.  Tonsils not swollen or erythematous. Hearing normal. MM pale, somewhat dry  Neck: Supple Respiratory: Respiratory effort normal, BS equal bilaterally without rales, rhonchi, wheezing or stridor.  Cardio: RRR with no MRGs. Brisk peripheral pulses without edema.  Abdomen: Soft, + BS.  Non tender, no guarding, rebound, hernias, masses. Lymphatics: Non tender without lymphadenopathy.  Musculoskeletal: slow gait with walker Skin: Warm, dry without rashes, lesions, ecchymosis.  Neuro: Normal muscle tone, no cerebellar symptoms.  Psych: Awake and oriented X 3, normal affect, Insight and Judgment appropriate.     Izora Ribas, NP 9:20 AM [facility]

## 2018-07-04 ENCOUNTER — Encounter: Payer: Self-pay | Admitting: Adult Health

## 2018-07-04 ENCOUNTER — Ambulatory Visit: Payer: Medicare Other | Admitting: Adult Health

## 2018-07-04 VITALS — BP 138/78 | HR 81 | Temp 97.7°F | Ht 72.0 in | Wt 186.0 lb

## 2018-07-04 DIAGNOSIS — N183 Chronic kidney disease, stage 3 unspecified: Secondary | ICD-10-CM

## 2018-07-04 DIAGNOSIS — E1122 Type 2 diabetes mellitus with diabetic chronic kidney disease: Secondary | ICD-10-CM

## 2018-07-04 DIAGNOSIS — D62 Acute posthemorrhagic anemia: Secondary | ICD-10-CM

## 2018-07-04 DIAGNOSIS — D696 Thrombocytopenia, unspecified: Secondary | ICD-10-CM

## 2018-07-04 DIAGNOSIS — K259 Gastric ulcer, unspecified as acute or chronic, without hemorrhage or perforation: Secondary | ICD-10-CM | POA: Diagnosis not present

## 2018-07-04 DIAGNOSIS — E876 Hypokalemia: Secondary | ICD-10-CM

## 2018-07-04 DIAGNOSIS — I129 Hypertensive chronic kidney disease with stage 1 through stage 4 chronic kidney disease, or unspecified chronic kidney disease: Secondary | ICD-10-CM | POA: Diagnosis not present

## 2018-07-04 DIAGNOSIS — K922 Gastrointestinal hemorrhage, unspecified: Secondary | ICD-10-CM | POA: Diagnosis not present

## 2018-07-04 DIAGNOSIS — K269 Duodenal ulcer, unspecified as acute or chronic, without hemorrhage or perforation: Secondary | ICD-10-CM | POA: Diagnosis not present

## 2018-07-04 DIAGNOSIS — K219 Gastro-esophageal reflux disease without esophagitis: Secondary | ICD-10-CM

## 2018-07-04 NOTE — Patient Instructions (Addendum)
Iron Deficiency Anemia, Adult  Iron deficiency is having a lack of iron in the body. Iron is an important mineral that your body needs to build healthy red blood cells. Iron deficiency anemia is a condition in which the concentration of red blood cells or hemoglobin in the blood is below normal because of too little iron. Hemoglobin is a substance in red blood cells that carries oxygen to the body's tissues. You may develop iron deficiency anemia due to:  Blood loss from an injury or condition such as Crohn's disease.  Your body being unable to properly absorb iron and use it to create red blood cells.  Lack of iron in your diet. You can prevent iron deficiency anemia by making certain changes to your diet and lifestyle. What nutrition changes can be made?  Eat foods that are high in iron, such as: ? Red meat, especially liver and beef. ? Poultry. ? Seafood. ? Dried fruit. ? Prune juice. ? Nuts. ? Pumpkin seeds. ? Beans. ? Leafy green vegetables. ? Molasses. ? Tofu.  Look for foods that have added iron (are fortified). Many cereals and breads are iron fortified.  Eat foods that contain vitamin C along with iron-rich foods, preferably in the same meal. Vitamin C increases your body's ability to absorb iron. Foods high in vitamin C include: ? Citrus fruits, such as lemons, oranges, and grapefruits. ? Berries. ? Bell peppers. ? Tomatoes. ? Broccoli.  Do not follow a diet that is very low in fat or very high in fiber.  Do not drink very large amounts of milk, tea, or coffee. What actions can I take to lower my risk? It is important to know whether you are at risk for iron deficiency anemia. Ask your health care provider if you need a blood test to measure your iron or red blood cells. You may have a higher risk for iron deficiency anemia if:  You are a woman and one of the following applies: ? You have heavy menstrual periods. ? You are pregnant. ? You are  breastfeeding.  You have had bypass surgery for weight loss.  You have a digestive disorder such as Crohn's disease, irritable bowel syndrome, or celiac disease.  You regularly take antacids or acid-lowering medicines.  You follow a vegetarian or vegan diet. To lower your risk for iron deficiency:  If you are a vegetarian or vegan, talk with your health care provider or a dietitian about: ? Taking an iron supplement. ? Adding more iron-rich foods to your diet.  Limit your use of antacids or acid-lowering medicines.  If you have heavy menstrual periods, are pregnant, or are breastfeeding, ask your health care provider about taking an iron supplement.  Work with your health care provider to manage conditions that can cause iron deficiency.  Take over-the-counter and prescription medicines only as told by your health care provider.  Keep all follow-up visits as told by your health care provider. This is important. Why are these changes important? It is important to make these changes so that you do not develop iron deficiency anemia. Iron-rich foods help your body produce more red blood cells and have more energy. What can happen if changes are not made? If you do not make these changes, you could develop iron deficiency anemia. If not treated, iron deficiency anemia can lead to serious health complications, including:  Long-term (chronic) fatigue.  Shortness of breath.  Abnormal heart rhythms.  Heart failure.  Uncomfortable sensations and an overwhelming urge  to move your legs (restless legs syndrome).  Weakened disease-fighting system (immune system). Where to find more information Learn more about preventing iron deficiency from:  [place] [place]: https://wilson-eaton.com/  American Society of Hematology: www.hematology.org Contact a health care provider if you:  Develop symptoms of iron deficiency, including: ? Fatigue. ? Headache. ? Pale skin,  lips, and nail beds. ? Poor appetite. ? Weakness. Summary  Iron deficiency anemia is a condition in which the concentration of red blood cells or hemoglobin in the blood is below normal because of too little iron.  You can help prevent iron deficiency anemia by eating more iron-rich foods, such as red meat, poultry, or tofu. Look for foods that have added iron (are fortified), such as cereals.  Eat foods that contain vitamin C along with iron-rich foods, preferably in the same meal. Vitamin C increases the body's ability to absorb iron.  Ask your health care provider about your risk for iron deficiency anemia and whether a multivitamin or supplement may be right for you. This information is not intended to replace advice given to you by your health care provider. Make sure you discuss any questions you have with your health care provider. Document Released: 02/16/2017 Document Revised: 02/16/2017 Document Reviewed: 02/16/2017 Elsevier Interactive Patient Education  2019 [employer].

## 2018-07-05 LAB — COMPLETE METABOLIC PANEL WITH GFR
AG Ratio: 1.6 (calc) (ref 1.0–2.5)
ALT: 8 U/L — ABNORMAL LOW (ref 9–46)
AST: 16 U/L (ref 10–35)
Albumin: 3.6 g/dL (ref 3.6–5.1)
Alkaline phosphatase (APISO): 61 U/L (ref 35–144)
BUN/Creatinine Ratio: 11 (calc) (ref 6–22)
BUN: 15 mg/dL (ref 7–25)
CO2: 35 mmol/L — ABNORMAL HIGH (ref 20–32)
Calcium: 8.9 mg/dL (ref 8.6–10.3)
Chloride: 102 mmol/L (ref 98–110)
Creat: 1.35 mg/dL — ABNORMAL HIGH (ref 0.70–1.11)
GFR, Est African American: 56 mL/min/{1.73_m2} — ABNORMAL LOW (ref >=60)
GFR, Est Non African American: 48 mL/min/{1.73_m2} — ABNORMAL LOW (ref >=60)
Globulin: 2.2 g/dL (calc) (ref 1.9–3.7)
Glucose, Bld: 113 mg/dL — ABNORMAL HIGH (ref 65–99)
Potassium: 4.4 mmol/L (ref 3.5–5.3)
Sodium: 144 mmol/L (ref 135–146)
Total Bilirubin: 0.5 mg/dL (ref 0.2–1.2)
Total Protein: 5.8 g/dL — ABNORMAL LOW (ref 6.1–8.1)

## 2018-07-05 LAB — CBC WITH DIFFERENTIAL/PLATELET
Absolute Monocytes: 543 cells/uL (ref 200–950)
Basophils Absolute: 37 cells/uL (ref 0–200)
Basophils Relative: 0.4 %
Eosinophils Absolute: 138 cells/uL (ref 15–500)
Eosinophils Relative: 1.5 %
HCT: 30.1 % — ABNORMAL LOW (ref 38.5–50.0)
Hemoglobin: 9.8 g/dL — ABNORMAL LOW (ref 13.2–17.1)
Lymphs Abs: 1748 cells/uL (ref 850–3900)
MCH: 31 pg (ref 27.0–33.0)
MCHC: 32.6 g/dL (ref 32.0–36.0)
MCV: 95.3 fL (ref 80.0–100.0)
MPV: 9.3 fL (ref 7.5–12.5)
Monocytes Relative: 5.9 %
Neutro Abs: 6734 cells/uL (ref 1500–7800)
Neutrophils Relative %: 73.2 %
Platelets: 324 10*3/uL (ref 140–400)
RBC: 3.16 10*6/uL — ABNORMAL LOW (ref 4.20–5.80)
RDW: 16 % — ABNORMAL HIGH (ref 11.0–15.0)
Total Lymphocyte: 19 %
WBC: 9.2 10*3/uL (ref 3.8–10.8)

## 2018-07-05 LAB — TEST AUTHORIZATION

## 2018-07-05 LAB — IRON,TIBC AND FERRITIN PANEL
%SAT: 14 % (calc) — ABNORMAL LOW (ref 20–48)
Ferritin: 40 ng/mL (ref 24–380)
Iron: 44 ug/dL — ABNORMAL LOW (ref 50–180)
TIBC: 315 mcg/dL (calc) (ref 250–425)

## 2018-07-05 LAB — MAGNESIUM: Magnesium: 2.2 mg/dL (ref 1.5–2.5)

## 2018-07-05 LAB — PHOSPHORUS: Phosphorus: 2.8 mg/dL (ref 2.1–4.3)

## 2018-07-06 DIAGNOSIS — I129 Hypertensive chronic kidney disease with stage 1 through stage 4 chronic kidney disease, or unspecified chronic kidney disease: Secondary | ICD-10-CM | POA: Diagnosis not present

## 2018-07-06 DIAGNOSIS — E1122 Type 2 diabetes mellitus with diabetic chronic kidney disease: Secondary | ICD-10-CM | POA: Diagnosis not present

## 2018-07-06 DIAGNOSIS — K269 Duodenal ulcer, unspecified as acute or chronic, without hemorrhage or perforation: Secondary | ICD-10-CM | POA: Diagnosis not present

## 2018-07-06 DIAGNOSIS — K259 Gastric ulcer, unspecified as acute or chronic, without hemorrhage or perforation: Secondary | ICD-10-CM | POA: Diagnosis not present

## 2018-07-06 DIAGNOSIS — N183 Chronic kidney disease, stage 3 (moderate): Secondary | ICD-10-CM | POA: Diagnosis not present

## 2018-07-10 ENCOUNTER — Encounter (INDEPENDENT_AMBULATORY_CARE_PROVIDER_SITE_OTHER): Payer: Self-pay | Admitting: Physical Medicine and Rehabilitation

## 2018-07-10 ENCOUNTER — Ambulatory Visit (INDEPENDENT_AMBULATORY_CARE_PROVIDER_SITE_OTHER): Payer: Self-pay

## 2018-07-10 ENCOUNTER — Ambulatory Visit (INDEPENDENT_AMBULATORY_CARE_PROVIDER_SITE_OTHER): Payer: Medicare Other | Admitting: Physical Medicine and Rehabilitation

## 2018-07-10 VITALS — BP 131/79 | HR 82 | Temp 98.2°F

## 2018-07-10 DIAGNOSIS — M25551 Pain in right hip: Secondary | ICD-10-CM | POA: Diagnosis not present

## 2018-07-10 DIAGNOSIS — M79604 Pain in right leg: Secondary | ICD-10-CM | POA: Diagnosis not present

## 2018-07-10 DIAGNOSIS — M5416 Radiculopathy, lumbar region: Secondary | ICD-10-CM | POA: Diagnosis not present

## 2018-07-10 DIAGNOSIS — M545 Low back pain: Secondary | ICD-10-CM | POA: Diagnosis not present

## 2018-07-10 DIAGNOSIS — M48062 Spinal stenosis, lumbar region with neurogenic claudication: Secondary | ICD-10-CM | POA: Diagnosis not present

## 2018-07-10 NOTE — Progress Notes (Signed)
[name] - 85 y.o. male MRN [id number]  Date of birth: 1934/04/05  Office Visit Note: Visit Date: 07/10/2018 PCP: [name], MD Referred by: [name], MD  Subjective: Chief Complaint  Patient presents with  . Lower Back - Pain  . Right Hip - Pain  . Right Leg - Pain   HPI: [name] is a 85 y.o. male who comes in today For diagnostic and therapeutic right L4 transforaminal injection as directed by Dr. Jean Rosenthal.  He has been seen by Dr. Ninfa Linden looking at his hip as a potential source of pain.  Patient also has end-stage osteoarthritis of the right knee.  His pain complaints are interesting that he does get right hip and groin pain but he gets more posterior hip pain down the leg posterior laterally and into the anterior shin more of an L4 somewhat L5 distribution.  He does have lumbar spine issues dating back to 2018.  MRI shows a combination of broad disc bulge coupled with facet arthropathy and canal narrowing at L4-5 which could cause right L4 nerve root irritation or even L5.  He typically is on a blood thinner for the cardiac issues but he has been off the blood thinner did upper GI bleed.  He was hospitalized last month.  He is off the blood thinner currently but nonetheless we could have done this injection while he was on the blood thinner.  ROS Otherwise per HPI.  Assessment & Plan: Visit Diagnoses:  1. Lumbar radiculopathy   2. Spinal stenosis of lumbar region with neurogenic claudication     Plan: No additional findings.   Meds & Orders: No orders of the defined types were placed in this encounter.   Orders Placed This Encounter  Procedures  . XR C-ARM NO REPORT  . Epidural Steroid injection    Follow-up: Return if symptoms worsen or fail to improve.   Procedures: No procedures performed  Lumbosacral Transforaminal Epidural Steroid Injection - Sub-Pedicular Approach with Fluoroscopic Guidance  Patient: [name]      Date of Birth: 1933/06/19 MRN: [id number] PCP: [name], MD      Visit Date: 07/10/2018   Universal Protocol:    Date/Time: 07/10/2018  Consent Given By: the patient  Position: PRONE  Additional Comments: Vital signs were monitored before and after the procedure. Patient was prepped and draped in the usual sterile fashion. The correct patient, procedure, and site was verified.   Injection Procedure Details:  Procedure Site One Meds Administered: No orders of the defined types were placed in this encounter.   Laterality: Right  Location/Site:  L4-L5  Needle size: 53 G  Needle type: Spinal  Needle Placement: Transforaminal  Findings:    -Comments: Excellent flow of contrast along the nerve and into the epidural space.  Procedure Details: After squaring off the end-plates to get a true AP view, the C-arm was positioned so that an oblique view of the foramen as noted above was visualized. The target area is just inferior to the "nose of the scotty dog" or sub pedicular. The soft tissues overlying this structure were infiltrated with 2-3 ml. of 1% Lidocaine without Epinephrine.  The spinal needle was inserted toward the target using a "trajectory" view along the fluoroscope beam.  Under AP and lateral visualization, the needle was advanced so it did not puncture dura and was located close the 6 O'Clock position of the pedical in AP tracterory. Biplanar projections were used to confirm position.  Aspiration was confirmed to be negative for CSF and/or blood. A 1-2 ml. volume of Isovue-250 was injected and flow of contrast was noted at each level. Radiographs were obtained for documentation purposes.   After attaining the desired flow of contrast documented above, a 0.5 to 1.0 ml test dose of 0.25% Marcaine was injected into each respective transforaminal space.  The patient was observed for 90 seconds post injection.  After no sensory deficits were reported, and  normal lower extremity motor function was noted,   the above injectate was administered so that equal amounts of the injectate were placed at each foramen (level) into the transforaminal epidural space.   Additional Comments:  The patient tolerated the procedure well Dressing: 2 x 2 sterile gauze and Band-Aid    Post-procedure details: Patient was observed during the procedure. Post-procedure instructions were reviewed.  Patient left the clinic in stable condition.      Clinical History: MRI LUMBAR SPINE WITHOUT CONTRAST  TECHNIQUE: Multiplanar, multisequence MR imaging of the lumbar spine was performed. No intravenous contrast was administered.  COMPARISON:  CT lumbar spine January 05, 2015  FINDINGS: SEGMENTATION: For the purposes of this report, the last well-formed intervertebral disc will be described as L5-S1.  ALIGNMENT: Straightened lumbar lordosis. No malalignment.  VERTEBRAE:Vertebral bodies are intact. Severe L3-4, moderate to severe L2-3, L4-5 and L5-S1 disc height loss with desiccation and proportional subacute on chronic discogenic endplate changes. Moderate chronic discogenic endplate changes Q4-6. Mild disc desiccation all lumbar levels. No suspicious or acute bone marrow signal.  CONUS MEDULLARIS: Conus medullaris terminates at L1-2 and demonstrates normal morphology and signal characteristics. Cauda equina is normal.  PARASPINAL AND SOFT TISSUES: Included prevertebral and paraspinal soft tissues are nonacute. Aortoiliac aneurysms as previously reported.  DISC LEVELS:  T12-L1, L1-2: No disc bulge, canal stenosis nor neural foraminal narrowing.  L2-3: Small broad-based disc bulge. Mild facet arthropathy and ligamentum flavum redundancy without canal stenosis or neural foraminal narrowing.  L3-4: Small broad-based disc bulge and endplate spurring. Mild facet arthropathy and ligamentum flavum redundancy. Mild canal stenosis though  partial effacement LEFT lateral recess could affect the traversing LEFT L4 nerve. Moderate LEFT neural foraminal narrowing.  L4-5: 5 mm broad-based disc bulge asymmetric to the RIGHT may affect the exited RIGHT L4 nerve. Mild facet arthropathy and ligamentum flavum redundancy. Moderate canal stenosis. Moderate to severe RIGHT, severe LEFT neural foraminal narrowing.  L5-S1: 4 mm broad-based disc bulge. Mild facet arthropathy without canal stenosis. Moderate RIGHT, mild LEFT neural foraminal narrowing.  IMPRESSION: Degenerative lumbar spine resulting in moderate canal stenosis L4-5.  Neural foraminal narrowing L3-4 through L5-S1: Severe on the LEFT at L4-5.   Electronically Signed   By: Elon Alas M.D.   On: 09/13/2016 17:53   He reports that he quit smoking about 55 years ago. He quit smokeless tobacco use about 31 years ago.  His smokeless tobacco use included chew.  Recent Labs    10/26/17 1048 01/30/18 1454 05/24/18 1617  HGBA1C 5.8* 6.2* 6.3*    Objective:  VS:  HT:    WT:   BMI:     BP:131/79  HR:82bpm  TEMP:98.2 F (36.8 C)(Oral)  RESP:  Physical Exam  Ortho Exam Imaging: Xr C-arm No Report  Result Date: 07/10/2018 Please see Notes tab for imaging impression.   Past Medical/Family/Surgical/Social History: Medications & Allergies reviewed per EMR, new medications updated. Patient Active Problem List   Diagnosis Date Noted  . Acute blood loss anemia 06/24/2018  . Upper  GI bleed 06/24/2018  . History of lacunar cerebrovascular accident (CVA) 02/12/2018  . Dizziness 02/12/2018  . Myofascial pain 12/18/2017  . Spondylosis without myelopathy or radiculopathy, lumbar region 12/18/2017  . Lumbar radiculopathy, right 12/18/2017  . Cholelithiasis 05/17/2017  . Sinus Bradycardia 05/17/2017  . Thoracic radiculopathy 04/12/2017  . Primary osteoarthritis of right knee 12/14/2016  . DDD (degenerative disc disease), lumbar 10/10/2016  . Lumbar facet  arthropathy 08/29/2016  . Idiopathic scoliosis 03/22/2016  . Diabetic neuropathy ([place]) 03/22/2016  . Dysautonomia orthostatic hypotension syndrome ([place]) 02/25/2016  . CKD stage 3 due to type 2 diabetes mellitus ([place]) 02/19/2016  . Coronary artery disease involving coronary bypass graft of native heart without angina pectoris   . Diabetes mellitus type 2, diet-controlled ([place])   . 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 PVD (peripheral vascular disease) ([place]) 01/01/2014  . Hyperlipidemia associated with type 2 diabetes mellitus ([place]) 04/11/2013  . Supine hypertension   . Vitamin D deficiency   . Vitamin B 12 deficiency   . Coronary atherosclerosis- s/p PCI to LAD in 2009 and PCI to RCA in 2011 02/27/2009  . Abdominal aortic aneurysm ([place]) 02/27/2009  . GERD 02/27/2009   Past Medical History:  Diagnosis Date  . 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 Colon polyps   . Coronary atherosclerosis of unspecified type of vessel, native or graft   . Diabetes ([place])   . Difficult intubation   . Esophageal reflux   . High cholesterol   . History of IBS 02/27/2009  . Hypertension    pt denies, he says he has a h/o hypotension. If BP up he adjusts the [place]  . Orthostatic hypotension    "BP has been dropping alot when I stand up for the last month or so" (02/17/2016)  . Vitamin B 12 deficiency   . Vitamin D deficiency    Family History  Problem Relation Age of Onset  . Heart attack Father        died age 22  . Heart attack Brother        died age 3  . Anuerysm Brother        aortic  . Heart attack Sister        died age 35  . Colon cancer Sister   . Liver cancer Sister   . Diabetes Maternal Grandmother   . Colon polyps Sister        and brothers x 2    Past Surgical History:  Procedure Laterality Date  . BIOPSY  06/25/2018   Procedure: BIOPSY;  Surgeon: Rush Landmark Telford Nab., MD;  Location: [facility];  Service: Gastroenterology;;  . BLADDER SURGERY   1969   traumatic pelvic fractures, urethral and bladder repair  . CARDIAC CATHETERIZATION N/A 07/01/2015   Procedure: Left Heart Cath and Coronary Angiography;  Surgeon: Wellington Hampshire, MD;  Location: [facility];  Service: Cardiovascular;  Laterality: N/A;  . COLON RESECTION N/A 05/17/2017   Procedure: DIAGNOSTIC LAPAROSCOPY,;  Surgeon: Leighton Ruff, MD;  Location: [facility];  Service: General;  Laterality: N/A;  . CORONARY ANGIOPLASTY WITH STENT PLACEMENT    . CORONARY ARTERY BYPASS GRAFT N/A 07/06/2015   Procedure: CORONARY ARTERY BYPASS GRAFTING (CABG)x 4   utilizing the left internal mammary artery and endoscopically harvested bilateral  sapheneous vein.;  Surgeon: Ivin Poot, MD;  Location: [facility];  Service: Open Heart Surgery;  Laterality: N/A;  . ESOPHAGOGASTRODUODENOSCOPY (EGD) WITH PROPOFOL N/A  06/25/2018   Procedure: ESOPHAGOGASTRODUODENOSCOPY (EGD) WITH PROPOFOL;  Surgeon: Rush Landmark Telford Nab., MD;  Location: [facility];  Service: Gastroenterology;  Laterality: N/A;  . KNEE SURGERY    . LOOP RECORDER INSERTION N/A 02/19/2018   Procedure: LOOP RECORDER INSERTION;  Surgeon: Deboraha Sprang, MD;  Location: [facility];  Service: Cardiovascular;  Laterality: N/A;  . TEE WITHOUT CARDIOVERSION N/A 07/06/2015   Procedure: TRANSESOPHAGEAL ECHOCARDIOGRAM (TEE);  Surgeon: Ivin Poot, MD;  Location: [facility];  Service: Open Heart Surgery;  Laterality: N/A;   Social History   Occupational History  . Occupation: Retired  Tobacco Use  . Smoking status: Former Smoker    Last attempt to quit: 07/16/1963    Years since quitting: 55.0  . Smokeless tobacco: Former Systems developer    Types: Chew    Quit date: 1989  Substance and Sexual Activity  . Alcohol use: No  . Drug use: No  . Sexual activity: Not Currently

## 2018-07-10 NOTE — Progress Notes (Signed)
Carelink Summary Report / Loop Recorder 

## 2018-07-10 NOTE — Progress Notes (Signed)
 .  Numeric Pain Rating Scale and Functional Assessment Average Pain 8   In the last MONTH (on 0-10 scale) has pain interfered with the following?  1. General activity like being  able to carry out your everyday physical activities such as walking, climbing stairs, carrying groceries, or moving a chair?  Rating(7)   +Driver, -BT, -Dye Allergies.  

## 2018-07-10 NOTE — Procedures (Signed)
Lumbosacral Transforaminal Epidural Steroid Injection - Sub-Pedicular Approach with Fluoroscopic Guidance  Patient: [name]      Date of Birth: 1934-01-08 MRN: [id number] PCP: [name], MD      Visit Date: 07/10/2018   Universal Protocol:    Date/Time: 07/10/2018  Consent Given By: the patient  Position: PRONE  Additional Comments: Vital signs were monitored before and after the procedure. Patient was prepped and draped in the usual sterile fashion. The correct patient, procedure, and site was verified.   Injection Procedure Details:  Procedure Site One Meds Administered: No orders of the defined types were placed in this encounter.   Laterality: Right  Location/Site:  L4-L5  Needle size: 68 G  Needle type: Spinal  Needle Placement: Transforaminal  Findings:    -Comments: Excellent flow of contrast along the nerve and into the epidural space.  Procedure Details: After squaring off the end-plates to get a true AP view, the C-arm was positioned so that an oblique view of the foramen as noted above was visualized. The target area is just inferior to the "nose of the scotty dog" or sub pedicular. The soft tissues overlying this structure were infiltrated with 2-3 ml. of 1% Lidocaine without Epinephrine.  The spinal needle was inserted toward the target using a "trajectory" view along the fluoroscope beam.  Under AP and lateral visualization, the needle was advanced so it did not puncture dura and was located close the 6 O'Clock position of the pedical in AP tracterory. Biplanar projections were used to confirm position. Aspiration was confirmed to be negative for CSF and/or blood. A 1-2 ml. volume of Isovue-250 was injected and flow of contrast was noted at each level. Radiographs were obtained for documentation purposes.   After attaining the desired flow of contrast documented above, a 0.5 to 1.0 ml test dose of 0.25% Marcaine was injected into each  respective transforaminal space.  The patient was observed for 90 seconds post injection.  After no sensory deficits were reported, and normal lower extremity motor function was noted,   the above injectate was administered so that equal amounts of the injectate were placed at each foramen (level) into the transforaminal epidural space.   Additional Comments:  The patient tolerated the procedure well Dressing: 2 x 2 sterile gauze and Band-Aid    Post-procedure details: Patient was observed during the procedure. Post-procedure instructions were reviewed.  Patient left the clinic in stable condition.

## 2018-07-11 DIAGNOSIS — E1122 Type 2 diabetes mellitus with diabetic chronic kidney disease: Secondary | ICD-10-CM | POA: Diagnosis not present

## 2018-07-11 DIAGNOSIS — K269 Duodenal ulcer, unspecified as acute or chronic, without hemorrhage or perforation: Secondary | ICD-10-CM | POA: Diagnosis not present

## 2018-07-11 DIAGNOSIS — I129 Hypertensive chronic kidney disease with stage 1 through stage 4 chronic kidney disease, or unspecified chronic kidney disease: Secondary | ICD-10-CM | POA: Diagnosis not present

## 2018-07-11 DIAGNOSIS — K259 Gastric ulcer, unspecified as acute or chronic, without hemorrhage or perforation: Secondary | ICD-10-CM | POA: Diagnosis not present

## 2018-07-11 DIAGNOSIS — N183 Chronic kidney disease, stage 3 (moderate): Secondary | ICD-10-CM | POA: Diagnosis not present

## 2018-07-13 ENCOUNTER — Telehealth (INDEPENDENT_AMBULATORY_CARE_PROVIDER_SITE_OTHER): Payer: Self-pay | Admitting: Orthopaedic Surgery

## 2018-07-13 DIAGNOSIS — E1122 Type 2 diabetes mellitus with diabetic chronic kidney disease: Secondary | ICD-10-CM | POA: Diagnosis not present

## 2018-07-13 DIAGNOSIS — N183 Chronic kidney disease, stage 3 (moderate): Secondary | ICD-10-CM | POA: Diagnosis not present

## 2018-07-13 DIAGNOSIS — K269 Duodenal ulcer, unspecified as acute or chronic, without hemorrhage or perforation: Secondary | ICD-10-CM | POA: Diagnosis not present

## 2018-07-13 DIAGNOSIS — I129 Hypertensive chronic kidney disease with stage 1 through stage 4 chronic kidney disease, or unspecified chronic kidney disease: Secondary | ICD-10-CM | POA: Diagnosis not present

## 2018-07-13 DIAGNOSIS — K259 Gastric ulcer, unspecified as acute or chronic, without hemorrhage or perforation: Secondary | ICD-10-CM | POA: Diagnosis not present

## 2018-07-13 NOTE — Telephone Encounter (Signed)
Please advise 

## 2018-07-13 NOTE — Telephone Encounter (Signed)
New Message  Pt verbalized therapy came on Wednesday, now he's hurting and can hardly walk. Hip, leg, knee, and groin are hurting.

## 2018-07-13 NOTE — Telephone Encounter (Signed)
Can you put him on the schedule at 9:15 Tuesday morning

## 2018-07-13 NOTE — Telephone Encounter (Signed)
Nothing else I can really do except have Artis Delay see him next week.

## 2018-07-16 ENCOUNTER — Telehealth: Payer: Self-pay | Admitting: Gastroenterology

## 2018-07-16 NOTE — Telephone Encounter (Signed)
Pt is scheduled for a hospital follow up with Dr. Rush Landmark for UGI bleed.  Wife reported that he is bleeding again.  Pt requested to be seen sooner--is it OK to book a triage spot with the PA?

## 2018-07-16 NOTE — Telephone Encounter (Signed)
The pt's wife states that the pt has developed dark stools, fatigue, SOB, dizziness. He has a history of recent upper GI bleed. Pt was advised to go to the ED for eval. The pt has been advised of the information and verbalized understanding.

## 2018-07-17 ENCOUNTER — Encounter: Payer: Self-pay | Admitting: Physician Assistant

## 2018-07-17 ENCOUNTER — Other Ambulatory Visit (INDEPENDENT_AMBULATORY_CARE_PROVIDER_SITE_OTHER): Payer: Medicare Other

## 2018-07-17 ENCOUNTER — Encounter (INDEPENDENT_AMBULATORY_CARE_PROVIDER_SITE_OTHER): Payer: Self-pay | Admitting: Specialist

## 2018-07-17 ENCOUNTER — Ambulatory Visit (INDEPENDENT_AMBULATORY_CARE_PROVIDER_SITE_OTHER): Payer: Medicare Other

## 2018-07-17 ENCOUNTER — Other Ambulatory Visit: Payer: Self-pay

## 2018-07-17 ENCOUNTER — Ambulatory Visit (INDEPENDENT_AMBULATORY_CARE_PROVIDER_SITE_OTHER): Payer: Medicare Other | Admitting: Physician Assistant

## 2018-07-17 ENCOUNTER — Ambulatory Visit: Payer: Medicare Other | Admitting: Physician Assistant

## 2018-07-17 ENCOUNTER — Ambulatory Visit (INDEPENDENT_AMBULATORY_CARE_PROVIDER_SITE_OTHER): Payer: Medicare Other | Admitting: Specialist

## 2018-07-17 VITALS — BP 160/78 | HR 80 | Temp 96.8°F | Ht 72.0 in | Wt 182.0 lb

## 2018-07-17 VITALS — BP 152/82 | HR 73 | Ht 72.0 in | Wt 182.0 lb

## 2018-07-17 DIAGNOSIS — D509 Iron deficiency anemia, unspecified: Secondary | ICD-10-CM | POA: Diagnosis not present

## 2018-07-17 DIAGNOSIS — M48061 Spinal stenosis, lumbar region without neurogenic claudication: Secondary | ICD-10-CM

## 2018-07-17 DIAGNOSIS — K264 Chronic or unspecified duodenal ulcer with hemorrhage: Secondary | ICD-10-CM

## 2018-07-17 DIAGNOSIS — Z8719 Personal history of other diseases of the digestive system: Secondary | ICD-10-CM | POA: Diagnosis not present

## 2018-07-17 DIAGNOSIS — M1711 Unilateral primary osteoarthritis, right knee: Secondary | ICD-10-CM | POA: Diagnosis not present

## 2018-07-17 DIAGNOSIS — M4815 Ankylosing hyperostosis [Forestier], thoracolumbar region: Secondary | ICD-10-CM | POA: Diagnosis not present

## 2018-07-17 DIAGNOSIS — M5440 Lumbago with sciatica, unspecified side: Secondary | ICD-10-CM

## 2018-07-17 DIAGNOSIS — K921 Melena: Secondary | ICD-10-CM | POA: Diagnosis not present

## 2018-07-17 DIAGNOSIS — M4807 Spinal stenosis, lumbosacral region: Secondary | ICD-10-CM

## 2018-07-17 DIAGNOSIS — M4316 Spondylolisthesis, lumbar region: Secondary | ICD-10-CM

## 2018-07-17 DIAGNOSIS — I6930 Unspecified sequelae of cerebral infarction: Secondary | ICD-10-CM

## 2018-07-17 LAB — CBC WITH DIFFERENTIAL/PLATELET
Basophils Absolute: 0 10*3/uL (ref 0.0–0.1)
Basophils Relative: 0.2 % (ref 0.0–3.0)
Eosinophils Absolute: 0.2 10*3/uL (ref 0.0–0.7)
Eosinophils Relative: 1.6 % (ref 0.0–5.0)
HCT: 35.6 % — ABNORMAL LOW (ref 39.0–52.0)
Hemoglobin: 12 g/dL — ABNORMAL LOW (ref 13.0–17.0)
Lymphocytes Relative: 25.6 % (ref 12.0–46.0)
Lymphs Abs: 2.8 10*3/uL (ref 0.7–4.0)
MCHC: 33.6 g/dL (ref 30.0–36.0)
MCV: 92.9 fl (ref 78.0–100.0)
Monocytes Absolute: 0.6 10*3/uL (ref 0.1–1.0)
Monocytes Relative: 5.3 % (ref 3.0–12.0)
Neutro Abs: 7.4 10*3/uL (ref 1.4–7.7)
Neutrophils Relative %: 67.3 % (ref 43.0–77.0)
Platelets: 296 10*3/uL (ref 150.0–400.0)
RBC: 3.84 Mil/uL — ABNORMAL LOW (ref 4.22–5.81)
RDW: 17.5 % — ABNORMAL HIGH (ref 11.5–15.5)
WBC: 11 10*3/uL — ABNORMAL HIGH (ref 4.0–10.5)

## 2018-07-17 MED ORDER — BACLOFEN 10 MG PO TABS: 5.0000 mg | ORAL_TABLET | Freq: Two times a day (BID) | ORAL | 0 refills | Status: DC

## 2018-07-17 NOTE — Progress Notes (Signed)
Office Visit Note   Patient: [name]           Date of Birth: 03/24/1934           MRN: [id number] Visit Date: 07/17/2018              Requested by: [name], [address], [place] [address] PCP: [name], MD   Assessment & Plan: Visit Diagnoses:  1. Spinal stenosis of lumbar region without neurogenic claudication   2. Spinal stenosis of lumbosacral region   3. Spondylolisthesis of lumbar region   4. Forestier's disease of thoracolumbar region   5. Unilateral primary osteoarthritis, right knee     Plan: Avoid bending, stooping and avoid lifting weights greater than 10 lbs. Avoid prolong standing and walking. Avoid frequent bending and stooping  No lifting greater than 10 lbs. May use ice or moist heat for pain. Weight loss is of benefit. Handicap license is approved. MRI lumbar spine to assess for right L2, L3, L4 and L4 nerve compression. Lioresal (Baclofen) 5mg  twice a day for right leg spasm. Follow-Up Instructions: Return in about 3 weeks (around 08/07/2018).   Orders:  Orders Placed This Encounter  Procedures  . XR Lumbar Spine 2-3 Views   No orders of the defined types were placed in this encounter.     Procedures: No procedures performed   Clinical Data: No additional findings.   Subjective: Chief Complaint  Patient presents with  . Lower Back - Pain  . Right Leg - Pain    85 year old male with history thoracolumbar scoliosis and back pain, has had evaluated the mid back and cervical spine in the past. He was evaluated by Kristeen Miss and told there was nothing to be done. He has been experiencing pain into the right thigh and knee into the right anterior shin. He had severe pain at night. He uses an ice pak and if he moves the pain worsens. The right thigh Gives away, he has not fallen, except 2 weeks ago the physical therapist tried to have him squat And his feet slipped out from him and he had his  knee near his head. No numbness of tingling, does have peripheral neuropathy by Dr. Melford Aase. The right leg is weak, he leads with the left leg up stairs in the back. No bowel or bladder difficulty. Had history of bleeding ulcers, now with dark stools and has had evaluation and was felt to have been due to iron supplements. He can only walk about 100' before he has to sit down due to the right leg wanting to give away. He uses a walker now as of one week ago. Prior to that he was using a cane. He can not walk without the walker now.   Review of Systems  Constitutional: Positive for unexpected weight change. Negative for activity change, appetite change, chills, diaphoresis, fatigue and fever.  HENT: Negative for congestion, dental problem, drooling, ear discharge, ear pain, facial swelling, hearing loss, mouth sores, nosebleeds, postnasal drip, rhinorrhea, sinus pressure, sinus pain, sneezing, sore throat, tinnitus, trouble swallowing and voice change.   Eyes: Negative.  Negative for photophobia, pain, discharge, redness, itching and visual disturbance.  Respiratory: Negative for apnea, cough, choking, chest tightness, shortness of breath, wheezing and stridor.   Cardiovascular: Positive for palpitations. Negative for chest pain and leg swelling.  Gastrointestinal: Positive for blood in stool. Negative for abdominal distention, abdominal pain, anal bleeding, constipation, diarrhea, nausea, rectal pain  and vomiting.  Endocrine: Negative.  Negative for cold intolerance, heat intolerance, polydipsia, polyphagia and polyuria.  Genitourinary: Positive for frequency. Negative for difficulty urinating, dysuria, enuresis, flank pain, genital sores, hematuria and urgency.  Musculoskeletal: Positive for back pain and gait problem. Negative for arthralgias, joint swelling, myalgias, neck pain and neck stiffness.  Skin: Negative.  Negative for color change, pallor, rash and wound.  Allergic/Immunologic: Negative.    Neurological: Positive for weakness and numbness.  Hematological: Negative.  Negative for adenopathy. Does not bruise/bleed easily.  Psychiatric/Behavioral: Negative.  Negative for agitation, behavioral problems, confusion, decreased concentration, dysphoric mood, hallucinations, self-injury, sleep disturbance and suicidal ideas. The patient is not nervous/anxious and is not hyperactive.      Objective: Vital Signs: BP (!) 152/82 (BP Location: Left Arm, Patient Position: Sitting)   Pulse 73   Ht 6' (1.829 m)   Wt 182 lb (82.6 kg)   BMI 24.68 kg/m   Physical Exam Constitutional:      Appearance: He is well-developed.  HENT:     Head: Normocephalic and atraumatic.  Eyes:     Pupils: Pupils are equal, round, and reactive to light.  Neck:     Musculoskeletal: Normal range of motion and neck supple.  Pulmonary:     Effort: Pulmonary effort is normal.     Breath sounds: Normal breath sounds.  Abdominal:     General: Bowel sounds are normal.     Palpations: Abdomen is soft.  Skin:    General: Skin is warm and dry.  Neurological:     Mental Status: He is alert and oriented to person, place, and time.  Psychiatric:        Behavior: Behavior normal.        Thought Content: Thought content normal.        Judgment: Judgment normal.     Back Exam   Tenderness  The patient is experiencing tenderness in the lumbar.  Range of Motion  Extension: abnormal  Flexion: normal  Lateral bend right: abnormal  Lateral bend left: abnormal  Rotation right: abnormal  Rotation left: abnormal   Muscle Strength  Right Quadriceps:  4/5  Left Quadriceps:  5/5  Left Hamstrings:  5/5   Tests  Straight leg raise right: negative Straight leg raise left: negative  Reflexes  Patellar: Hyporeflexic Achilles: abnormal Babinski's sign: normal   Other  Toe walk: abnormal Heel walk: abnormal Gait: drop-foot  Erythema: no back redness Scars: absent  Comments:  Weak right knee, right hip  flexion both 4/5 right foot 4/5.       Specialty Comments:  No specialty comments available.  Imaging: Xr Lumbar Spine 2-3 Views  Result Date: 07/17/2018 AP and lateral flexion and extension radiographs of the lumbar spine demonstrate right scoliosis L1 to L5 19 degres apex right at L3-4. Multiple level DDD T11-12 throught L5-S1 with large anterior syndesmophytes.     PMFS History: Patient Active Problem List   Diagnosis Date Noted  . Acute blood loss anemia 06/24/2018  . Upper GI bleed 06/24/2018  . History of lacunar cerebrovascular accident (CVA) 02/12/2018  . Dizziness 02/12/2018  . Myofascial pain 12/18/2017  . Spondylosis without myelopathy or radiculopathy, lumbar region 12/18/2017  . Lumbar radiculopathy, right 12/18/2017  . Cholelithiasis 05/17/2017  . Sinus Bradycardia 05/17/2017  . Thoracic radiculopathy 04/12/2017  . Primary osteoarthritis of right knee 12/14/2016  . DDD (degenerative disc disease), lumbar 10/10/2016  . Lumbar facet arthropathy 08/29/2016  . Idiopathic scoliosis 03/22/2016  . Diabetic  neuropathy ([place]) 03/22/2016  . Dysautonomia orthostatic hypotension syndrome ([place]) 02/25/2016  . CKD stage 3 due to type 2 diabetes mellitus ([place]) 02/19/2016  . Coronary artery disease involving coronary bypass graft of native heart without angina pectoris   . Diabetes mellitus type 2, diet-controlled ([place])   . 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 PVD (peripheral vascular disease) ([place]) 01/01/2014  . Hyperlipidemia associated with type 2 diabetes mellitus ([place]) 04/11/2013  . Supine hypertension   . Vitamin D deficiency   . Vitamin B 12 deficiency   . Coronary atherosclerosis- s/p PCI to LAD in 2009 and PCI to RCA in 2011 02/27/2009  . Abdominal aortic aneurysm ([place]) 02/27/2009  . GERD 02/27/2009   Past Medical History:  Diagnosis Date  . 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 Colon polyps   . Coronary atherosclerosis of unspecified  type of vessel, native or graft   . Diabetes ([place])   . Difficult intubation   . Esophageal reflux   . High cholesterol   . History of IBS 02/27/2009  . Hypertension    pt denies, he says he has a h/o hypotension. If BP up he adjusts the Florine[place]  . Orthostatic hypotension    "BP has been dropping alot when I stand up for the last month or so" (02/17/2016)  . Vitamin B 12 deficiency   . Vitamin D deficiency     Family History  Problem Relation Age of Onset  . Heart attack Father        died age 45  . Heart attack Brother        died age 51  . Anuerysm Brother        aortic  . Heart attack Sister        died age 8  . Colon cancer Sister   . Liver cancer Sister   . Diabetes Maternal Grandmother   . Colon polyps Sister        and brothers x 2     Past Surgical History:  Procedure Laterality Date  . BIOPSY  06/25/2018   Procedure: BIOPSY;  Surgeon: Rush Landmark Telford Nab., MD;  Location: [facility];  Service: Gastroenterology;;  . BLADDER SURGERY  1969   traumatic pelvic fractures, urethral and bladder repair  . CARDIAC CATHETERIZATION N/A 07/01/2015   Procedure: Left Heart Cath and Coronary Angiography;  Surgeon: Wellington Hampshire, MD;  Location: [facility];  Service: Cardiovascular;  Laterality: N/A;  . COLON RESECTION N/A 05/17/2017   Procedure: DIAGNOSTIC LAPAROSCOPY,;  Surgeon: Leighton Ruff, MD;  Location: [facility];  Service: General;  Laterality: N/A;  . CORONARY ANGIOPLASTY WITH STENT PLACEMENT    . CORONARY ARTERY BYPASS GRAFT N/A 07/06/2015   Procedure: CORONARY ARTERY BYPASS GRAFTING (CABG)x 4   utilizing the left internal mammary artery and endoscopically harvested bilateral  sapheneous vein.;  Surgeon: Ivin Poot, MD;  Location: [facility];  Service: Open Heart Surgery;  Laterality: N/A;  . ESOPHAGOGASTRODUODENOSCOPY (EGD) WITH PROPOFOL N/A 06/25/2018   Procedure: ESOPHAGOGASTRODUODENOSCOPY (EGD) WITH PROPOFOL;  Surgeon: Rush Landmark Telford Nab., MD;  Location: [facility];  Service: Gastroenterology;  Laterality: N/A;  . KNEE SURGERY    . LOOP RECORDER INSERTION N/A 02/19/2018   Procedure: LOOP RECORDER INSERTION;  Surgeon: Deboraha Sprang, MD;  Location: [facility];  Service: Cardiovascular;  Laterality: N/A;  . TEE WITHOUT CARDIOVERSION N/A 07/06/2015   Procedure: TRANSESOPHAGEAL ECHOCARDIOGRAM (TEE);  Surgeon: Tharon Aquas Trigt,  MD;  Location: [facility];  Service: Open Heart Surgery;  Laterality: N/A;   Social History   Occupational History  . Occupation: Retired  Tobacco Use  . Smoking status: Former Smoker    Last attempt to quit: 07/16/1963    Years since quitting: 55.0  . Smokeless tobacco: Former Systems developer    Types: Chew    Quit date: 1989  Substance and Sexual Activity  . Alcohol use: No  . Drug use: No  . Sexual activity: Not Currently

## 2018-07-17 NOTE — Patient Instructions (Addendum)
Avoid bending, stooping and avoid lifting weights greater than 10 lbs. Avoid prolong standing and walking. Avoid frequent bending and stooping  No lifting greater than 10 lbs. May use ice or moist heat for pain. Weight loss is of benefit. Handicap license is approved. MRI lumbar spine to assess for right L2, L3, L4 and L4 nerve compression. Lioresal (Baclofen) 5mg  twice a day for right leg spasm.

## 2018-07-17 NOTE — Progress Notes (Signed)
Patient ID: LONNEL GJERDE, male   DOB: [birth year], 85 y.o.   MRN: [id number]    Subjective:    Patient ID: ADISON JERGER, male    DOB: 1933-05-29, 85 y.o.   MRN: [id number]  HPI Jaxtyn is a pleasant 85 year old white male, known to Dr. Rush Landmark from recent hospitalization with an upper GI bleed Patient comes in today for post hospital follow-up.  He was admitted 06/24/2018 through 06/27/2018 with melena and anemia.  Hemoglobin was 9 on admission and dropped to a low of 6.3.  He did require transfusions.  He had been on aspirin and Plavix prior to admission both of which are still on hold.  He had EGD on 06/25/2018 which showed 3 nonbleeding duodenal ulcers 1 of which had a flat pigmented spot in the bulb, the largest ulcer was 18 mm in size.  Also noted to have multiple gastric erosions.  No therapy was done as patient had not been off Plavix.  He was discharged home on twice daily Protonix for 1 month, then daily  Long term .He .was to have follow-up EGD with Dr. Rush Landmark in about 2 months. Patient says he was doing well when he left the hospital though still weak.  He did have follow-up with his PCP on 07/04/2018, had hemoglobin done which was up to 9.8, and had iron studies done showing serum iron of 44, TIBC 315, sat 14 and ferritin of 40.  He was started on an oral iron supplement.  He says about 5 days ago he noticed that his stools turned very dark and blackish in color called here for appointment.  He has been having 1-2 bowel movements per day which is his normal says they have all been very dark.  He also is complaining of generalized weakness.  He says he has some dizziness but that has been chronic ever since he had a stroke previously. He has no complaints of abdominal discomfort, no nausea or vomiting.  Again he has remained off aspirin and Plavix and has follow-up with cardiology actually tomorrow.   Review of Systems Pertinent positive and negative review of systems were noted in  the above HPI section.  All other review of systems was otherwise negative.  Outpatient Encounter Medications as of 07/17/2018  Medication Sig  . acetaminophen (TYLENOL) 500 MG tablet Take 1,000 mg by mouth 2 (two) times daily as needed for headache (pain).   . Ascorbic Acid (VITAMIN C) 500 MG CAPS Take 1 capsule by mouth daily.  . butalbital-acetaminophen-caffeine (FIORICET, ESGIC) 50-325-40 MG tablet Take 1 tablet every 4 hours ONLY if needed for severe Headache (Patient taking differently: Take 1 tablet by mouth daily as needed (severe headache). )  . Calcium Carb-Cholecalciferol (CALCIUM+D3 PO) Take 1 tablet by mouth daily.  . Cholecalciferol (VITAMIN D-3) 5000 units TABS Take 5,000 Units by mouth daily.  . Ferrous Sulfate (IRON SLOW RELEASE) 140 (45 Fe) MG TBCR Take 1 tablet by mouth daily.  . fludrocortisone (FLORINEF) 0.1 MG tablet Take 0.1 mg by mouth 2 (two) times daily.   Marland Kitchen gabapentin (NEURONTIN) 100 MG capsule TAKE 2 CAPSULE(200 MG) BY MOUTH 2 TIMES A DAY. (Patient taking differently: Take 200 mg by mouth 2 (two) times daily. )  . glucose blood test strip Check blood sugar 1 time daily-[id number]  . Magnesium 500 MG TABS Take 1 tablet by mouth daily.  Marland Kitchen MAGNESIUM PO Take 1,000 mg by mouth daily.  . Multiple Vitamin (MULTIVITAMIN WITH MINERALS) TABS tablet Take 1  tablet by mouth daily.  . Omega-3 Fatty Acids (FISH OIL OMEGA-3 PO) Take 1 capsule by mouth daily.  . pantoprazole (PROTONIX) 40 MG tablet Take 1 tablet (40 mg total) by mouth 2 (two) times daily. Take BID for a Month and then 40 mg Daily thereafter  . polyethylene glycol (MIRALAX / GLYCOLAX) packet Take 17 g by mouth daily. (Patient taking differently: Take 17 g by mouth 2 (two) times daily. )  . POTASSIUM CHLORIDE PO Take by mouth daily.  . vitamin B-12 (CYANOCOBALAMIN) 1000 MCG tablet Take 1,000 mcg by mouth daily.  Marland Kitchen aspirin 81 MG EC tablet TAKE 1 TABLET BY MOUTH EVERY DAY (Patient not taking: No sig reported)  . clopidogrel  (PLAVIX) 75 MG tablet Take 1 tablet (75 mg total) by mouth at bedtime. (Patient not taking: Reported on 07/04/2018)   No facility-administered encounter medications on file as of 07/17/2018.    Allergies  Allergen Reactions  . Cymbalta [Duloxetine Hcl] Other (See Comments)    Dizziness  . Keflex [Cephalexin] Other (See Comments)    dizziness  . Simvastatin Other (See Comments)    Joint pain  . Sudafed [Pseudoephedrine] Other (See Comments)    Dizziness   Patient Active Problem List   Diagnosis Date Noted  . Acute blood loss anemia 06/24/2018  . Upper GI bleed 06/24/2018  . History of lacunar cerebrovascular accident (CVA) 02/12/2018  . Dizziness 02/12/2018  . Myofascial pain 12/18/2017  . Spondylosis without myelopathy or radiculopathy, lumbar region 12/18/2017  . Lumbar radiculopathy, right 12/18/2017  . Cholelithiasis 05/17/2017  . Sinus Bradycardia 05/17/2017  . Thoracic radiculopathy 04/12/2017  . Primary osteoarthritis of right knee 12/14/2016  . DDD (degenerative disc disease), lumbar 10/10/2016  . Lumbar facet arthropathy 08/29/2016  . Idiopathic scoliosis 03/22/2016  . Diabetic neuropathy ([place]) 03/22/2016  . Dysautonomia orthostatic hypotension syndrome ([place]) 02/25/2016  . CKD stage 3 due to type 2 diabetes mellitus ([place]) 02/19/2016  . Coronary artery disease involving coronary bypass graft of native heart without angina pectoris   . Diabetes mellitus type 2, diet-controlled ([place])   . 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 PVD (peripheral vascular disease) ([place]) 01/01/2014  . Hyperlipidemia associated with type 2 diabetes mellitus ([place]) 04/11/2013  . Supine hypertension   . Vitamin D deficiency   . Vitamin B 12 deficiency   . Coronary atherosclerosis- s/p PCI to LAD in 2009 and PCI to RCA in 2011 02/27/2009  . Abdominal aortic aneurysm ([place]) 02/27/2009  . GERD 02/27/2009   Social History   Socioeconomic History  . Marital status: Married     Spouse name: Not on file  . Number of children: 2  . Years of education: Not on file  . Highest education level: High school graduate  Occupational History  . Occupation: Retired  Scientific laboratory technician  . Financial resource strain: Not on file  . Food insecurity:    Worry: Not on file    Inability: Not on file  . Transportation needs:    Medical: Not on file    Non-medical: Not on file  Tobacco Use  . Smoking status: Former Smoker    Last attempt to quit: 07/16/1963    Years since quitting: 55.0  . Smokeless tobacco: Former Systems developer    Types: Chew    Quit date: 1989  Substance and Sexual Activity  . Alcohol use: No  . Drug use: No  . Sexual activity: Not Currently  Lifestyle  . Physical activity:  Days per week: Not on file    Minutes per session: Not on file  . Stress: Not on file  Relationships  . Social connections:    Talks on phone: Not on file    Gets together: Not on file    Attends religious service: Not on file    Active member of club or organization: Not on file    Attends meetings of clubs or organizations: Not on file    Relationship status: Not on file  . Intimate partner violence:    Fear of current or ex partner: Not on file    Emotionally abused: Not on file    Physically abused: Not on file    Forced sexual activity: Not on file  Other Topics Concern  . Not on file  Social History Narrative   Lives at home with his wife Estill Bamberg   Right handed   No caffeine    Mr. Casanas's family history includes Anuerysm in his brother; Colon cancer in his sister; Colon polyps in his sister; Diabetes in his maternal grandmother; Heart attack in his brother, father, and sister; Liver cancer in his sister.      Objective:    Vitals:   07/17/18 1056  BP: (!) 160/78  Pulse: 80  Temp: (!) 96.8 F (36 C)    Physical Exam; well-developed elderly white male in no acute distress, very pleasant, accompanied by his wife.  Patient ambulating with a walker.  Height 6 foot,  weight 182, BMI 24.6.  HEENT ;nontraumatic normocephalic EOMI PERRLA sclera anicteric, oral mucosa moist.  Cardiovascular; regular rate and rhythm with S1-S2.  Pulmonary; clear bilaterally, Abdomen ;soft, nontender nondistended bowel sounds are active, there is no palpable mass or hepatosplenomegaly.  Rectal; exam dark brown stool Hemoccult negative.  Extremities ;no clubbing cyanosis or edema skin warm and dry, Neuropsych; alert and oriented, grossly nonfocal mood and affect appropriate       Assessment & Plan:   #81 85 year old white male here for post hospital follow-up after recent admission with an acute upper GI bleed secondary  to 3 duodenal ulcers, large cratered ulcers the largest of which was 18 mm.  1 of the ulcers had a flat pigmented spot.  There is no endoscopic therapy done at the time of EGD as patient had just stopped Plavix.  Patient now with complaint of black stools over the past 5 days and weakness. Last hemoglobin on 07/04/2018 was 9.8 improved. Stool is dark but Hemoccult negative today.  #2 iron deficiency anemia has started OTC ferrous sulfate 1 daily. #3 coronary artery disease status post LAD stent 2009 and RCA stent 2011 4.  Chronic antiplatelet therapy-on aspirin and Plavix currently both still on hold. 5.  History of CVA 6.  History of hypertension 7.  Chronic dizziness  Plan; repeat CBC today-he has had a drop in hemoglobin, we will need to repeat EGD urgently Continue  Protonix 40 mg p.o. twice daily x2 more weeks then decrease to once daily long-term Continue oral iron supplement daily. We will go ahead and schedule patient for follow-up EGD with Dr. Rush Landmark in [facility] for late April.  Procedure was discussed in detail with the patient and his wife including indications, risks and benefits and he is agreeable to proceed. Will await cardiology recommendations regarding resuming Plavix.  From GI standpoint it would be helpful to leave off Plavix and restart baby  aspirin, however will defer to cardiology.  Addendum-hemoglobin today up to 12.   Shayann Garbutt S  Cruz Bong PA-C 07/17/2018   Cc: [name], MD

## 2018-07-17 NOTE — Progress Notes (Signed)
Cardiology Office Note   Date:  07/18/2018   ID:  [name], DOB 1933-09-09, MRN [id number]  PCP:  Dennis Perla, MD  Cardiologist: Dr. Kirk Ruths, MD   Chief Complaint  Patient presents with  . Follow-up   History of Present Illness: [name] is a 85 y.o. male who presents for post hospital follow up, seen for Dr. Stanford Frank.   [name] has a prior hx of CAD, HTN,postural hypotension andHLD. He underwent a cardiac catheterization 3/17which revealed 3-vessel CAD with an echo that showed normal LVF. Preoperative evaluation included carotids and ABIs that were normal. He underwent CABG x 4 on 07/07/15. Post op course was unremarkable. Per chart review, he has had issues with orthostasis and hypotension sinceCABG.  MRI 12/2017 showed small acute infarct in the right temporal stem. He had a follow up echo 01/2018 that showed normal lV function, mild diastolic dysfunction and mild left atrial enlargement. Abdominal ultrasound 01/2018 showed 3.8 cm abdominal aortic aneurysm.  Had loop a loop recorder placed in 01/2018 for evaluation of CVA with associated syncope. He was last seen by Dr. Stanford Frank and was doing well however continued to have problems with orthostatic symptoms.   He has a recent hospitalization for GI bleed and was discharged on 06/27/2018. His Hb on ED presentation was found to be 9.0 but dropped to 6.3 with hemoccult positive stool sample. GI was consulted for EGD and showed nonbleeding erosive gastropathy, erythematous mucosa in the gastric body, multiple nonbleeding erosions in the stomach were 3 nonbleeding duodenal ulcers with largest flat pigmented spot in the bulb. He was typed and screened and transfused 2 units and started on Protonix drip which was then transitioned to Protonix twice daily at discharge. His ASA and Plavix were stopped for a brief period per GI with the recommendation of holding Plavix for one week and resumption of ASA. Cardiology  was not formally consulted.   The patient was seen by GI yesterday, 07/17/2018. He had complaints of dark stool and therefore a repeat CBC was completed. Per GI notes, they were awaiting our input for  recommendations regarding resuming Plavix versus ASA. Pr note review, from a GI standpoint it would be helpful to leave off Plavix and restart baby aspirin. Hemoccult stool was negative during that visit.   Today he presents and is doing well since hospital discharge. He was seen by his orthopedist and GI teams yesterday in which his antiplatelet therapy was deferred to cardiology. He was placed on iron supplementation at d/c and noticed dark stools several days ago with concern for recurrent bleed. A hemoccult sample was negative and CBC showed Hb of 12.0 yesterday. He denies SOB, chest pain, palpitations, LE swelling, orthopnea, dizziness, or syncope. He states that he continues to have orthostatic symptoms with no recent change. He reports no falls. I will defer final decision to primary cardiologist and will call the patient and let him know.   Past Medical History:  Diagnosis Date  . 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 Colon polyps   . Coronary atherosclerosis of unspecified type of vessel, native or graft   . Diabetes ([place])   . Difficult intubation   . Esophageal reflux   . High cholesterol   . History of IBS 02/27/2009  . Hypertension    pt denies, he says he has a h/o hypotension. If BP up he adjusts the Florinef  . Orthostatic hypotension    "BP has been dropping alot  when I stand up for the last month or so" (02/17/2016)  . Vitamin B 12 deficiency   . Vitamin D deficiency     Past Surgical History:  Procedure Laterality Date  . BIOPSY  06/25/2018   Procedure: BIOPSY;  Surgeon: Rush Landmark Telford Nab., MD;  Location: [facility];  Service: Gastroenterology;;  . BLADDER SURGERY  1969   traumatic pelvic fractures, urethral and bladder repair  . CARDIAC CATHETERIZATION N/A 07/01/2015    Procedure: Left Heart Cath and Coronary Angiography;  Surgeon: Wellington Hampshire, MD;  Location: [facility];  Service: Cardiovascular;  Laterality: N/A;  . COLON RESECTION N/A 05/17/2017   Procedure: DIAGNOSTIC LAPAROSCOPY,;  Surgeon: Leighton Ruff, MD;  Location: [facility];  Service: General;  Laterality: N/A;  . CORONARY ANGIOPLASTY WITH STENT PLACEMENT    . CORONARY ARTERY BYPASS GRAFT N/A 07/06/2015   Procedure: CORONARY ARTERY BYPASS GRAFTING (CABG)x 4   utilizing the left internal mammary artery and endoscopically harvested bilateral  sapheneous vein.;  Surgeon: Ivin Poot, MD;  Location: [facility];  Service: Open Heart Surgery;  Laterality: N/A;  . ESOPHAGOGASTRODUODENOSCOPY (EGD) WITH PROPOFOL N/A 06/25/2018   Procedure: ESOPHAGOGASTRODUODENOSCOPY (EGD) WITH PROPOFOL;  Surgeon: Rush Landmark Telford Nab., MD;  Location: [facility];  Service: Gastroenterology;  Laterality: N/A;  . KNEE SURGERY    . LOOP RECORDER INSERTION N/A 02/19/2018   Procedure: LOOP RECORDER INSERTION;  Surgeon: Deboraha Sprang, MD;  Location: [facility];  Service: Cardiovascular;  Laterality: N/A;  . TEE WITHOUT CARDIOVERSION N/A 07/06/2015   Procedure: TRANSESOPHAGEAL ECHOCARDIOGRAM (TEE);  Surgeon: Ivin Poot, MD;  Location: [facility];  Service: Open Heart Surgery;  Laterality: N/A;    Current Outpatient Medications  Medication Sig Dispense Refill  . acetaminophen (TYLENOL) 500 MG tablet Take 1,000 mg by mouth 2 (two) times daily as needed for headache (pain).     . Ascorbic Acid (VITAMIN C) 500 MG CAPS Take 1 capsule by mouth daily.    . baclofen (LIORESAL) 10 MG tablet Take 0.5 tablets (5 mg total) by mouth 2 (two) times daily. 30 each 0  . butalbital-acetaminophen-caffeine (FIORICET, ESGIC) 50-325-40 MG tablet Take 1 tablet every 4 hours ONLY if needed for severe Headache (Patient taking differently: Take 1 tablet by mouth daily as needed (severe headache). ) 30 tablet 0  . Calcium  Carb-Cholecalciferol (CALCIUM+D3 PO) Take 1 tablet by mouth daily.    . Cholecalciferol (VITAMIN D-3) 5000 units TABS Take 5,000 Units by mouth daily.    . Ferrous Sulfate (IRON SLOW RELEASE) 140 (45 Fe) MG TBCR Take 1 tablet by mouth daily.    . fludrocortisone (FLORINEF) 0.1 MG tablet Take 0.1 mg by mouth 2 (two) times daily.     Marland Kitchen gabapentin (NEURONTIN) 100 MG capsule TAKE 2 CAPSULE(200 MG) BY MOUTH 2 TIMES A DAY. (Patient taking differently: Take 200 mg by mouth 2 (two) times daily. ) 360 capsule 3  . glucose blood test strip Check blood sugar 1 time daily-DX-E11.9 100 each 5  . Magnesium 500 MG TABS Take 1 tablet by mouth daily.    Marland Kitchen MAGNESIUM PO Take 1,000 mg by mouth daily.    . Multiple Vitamin (MULTIVITAMIN WITH MINERALS) TABS tablet Take 1 tablet by mouth daily.    . Omega-3 Fatty Acids (FISH OIL OMEGA-3 PO) Take 1 capsule by mouth daily.    . pantoprazole (PROTONIX) 40 MG tablet Take 1 tablet (40 mg total) by mouth 2 (two) times daily. Take BID for  a Month and then 40 mg Daily thereafter 60 tablet 0  . polyethylene glycol (MIRALAX / GLYCOLAX) packet Take 17 g by mouth daily. (Patient taking differently: Take 17 g by mouth 2 (two) times daily. ) 28 each 1  . POTASSIUM CHLORIDE PO Take by mouth daily.    . vitamin B-12 (CYANOCOBALAMIN) 1000 MCG tablet Take 1,000 mcg by mouth daily.     No current facility-administered medications for this visit.     Allergies:   Cymbalta [duloxetine hcl]; Keflex [cephalexin]; Simvastatin; and Sudafed [pseudoephedrine]    Social History:  The patient  reports that he quit smoking about 55 years ago. He quit smokeless tobacco use about 31 years ago.  His smokeless tobacco use included chew. He reports that he does not drink alcohol or use drugs.   Family History:  The patient's family history includes Anuerysm in his brother; Colon cancer in his sister; Colon polyps in his sister; Diabetes in his maternal grandmother; Heart attack in his brother,  father, and sister; Liver cancer in his sister.    ROS:  Please see the history of present illness.   Otherwise, review of systems are positive for none. All other systems are reviewed and negative.    PHYSICAL EXAM: VS:  BP (!) 163/89   Pulse 87   Ht 6' (1.829 m)   Wt 186 lb 6.4 oz (84.6 kg)   SpO2 93%   BMI 25.28 kg/m  , BMI Body mass index is 25.28 kg/m.    General: Elderly, frail, NAD Skin: Warm, dry, intact  Head: Normocephalic, atraumatic, sclear, moist mucus membranes. Neck: Negative for carotid bruits. No JVD Lungs:Clear to ausculation bilaterally. No wheezes, rales, or rhonchi. Breathing is unlabored. Cardiovascular: RRR with S1 S2. No murmurs, rubs, gallops, or LV heave appreciated. MSK: Strength and tone appear normal for age. 5/5 in all extremities Extremities: No edema. No clubbing or cyanosis. DP/PT pulses 2+ bilaterally Neuro: Alert and oriented. No focal deficits. No facial asymmetry. MAE spontaneously. Psych: Responds to questions appropriately with normal affect.     EKG:  EKG is not ordered today.   Recent Labs: 05/24/2018: TSH 2.19 07/04/2018: ALT 8; BUN 15; Creat 1.35; Magnesium 2.2; Potassium 4.4; Sodium 144 07/17/2018: Hemoglobin 12.0; Platelets 296.0   Lipid Panel    Component Value Date/Time   CHOL 132 05/24/2018 1617   TRIG 142 05/24/2018 1617   HDL 39 (L) 05/24/2018 1617   CHOLHDL 3.4 05/24/2018 1617   VLDL 23 11/29/2016 0918   LDLCALC 70 05/24/2018 1617    Wt Readings from Last 3 Encounters:  07/18/18 186 lb 6.4 oz (84.6 kg)  07/17/18 182 lb (82.6 kg)  07/17/18 182 lb (82.6 kg)    Other studies Reviewed: Additional studies/ records that were reviewed today include:   Echocardiogram 02/01/2018: Study Conclusions  - Left ventricle: The cavity size was normal. There was mild   concentric hypertrophy. Systolic function was normal. The   estimated ejection fraction was in the range of 55% to 60%. Wall   motion was normal; there were no  regional wall motion   abnormalities. Doppler parameters are consistent with abnormal   left ventricular relaxation (grade 1 diastolic dysfunction).   Doppler parameters are consistent with high ventricular filling   pressure. - Aortic valve: Transvalvular velocity was within the normal range.   There was no stenosis. There was no regurgitation. - Mitral valve: Transvalvular velocity was within the normal range.   There was no evidence for stenosis. There  was trivial   regurgitation. - Left atrium: The atrium was mildly dilated. - Right ventricle: The cavity size was normal. Wall thickness was   normal. Systolic function was normal. - Atrial septum: No defect or patent foramen ovale was identified   by color flow Doppler. - Tricuspid valve: There was mild regurgitation. - Pulmonary arteries: Systolic pressure was within the normal   range. PA peak pressure: 30 mm Hg (S). - Global longitudinal strain -16.4% (mildly abnormal).  ASSESSMENT AND PLAN:  1. CAD status post CABG x4: -Denies chest pain or other ACS symptoms  -ASA and Plavix held in the setting of acute GI bleed>>>>>EGD and showed nonbleeding erosive gastropathy, erythematous mucosa in the gastric body, multiple nonbleeding erosions in the stomach were 3 nonbleeding duodenal ulcers with largest flat pigmented spot in the bulb.  -Per chart review, discussed with inpatient cardiology in which aspirin and Plavix to be held as long as GI feels it is absolutely necessary. Then only monotherapy with aspirin or Plavix can be restarted. GI recommending continuing ASA and resuming Plavix in 1 week.   -Final decision will be sent to primary cardiologist however will likely restart Plavix only in the setting of multiple ulcers and hx of multiple strokes -He is intolerant to statins  2. Hyperlipidemia: -Patient is intolerant to statins. And has not tolerated Zetia or Repatha -Place on Omega 3 per neurology>>>plan to see PCP next week for  labs  3. Orthostatic hypotension: -BP elevated today>>>will accept higher reading given his long hx of orthostatic problems -Continue Florinef  4. Abdominal aortic aneurysm: -Follow with abdominal US 01/2019  5. Hx of CVA x5: -Implantable loop monitor in place.   Current medicines are reviewed at length with the patient today.  The patient does not have concerns regarding medicines.  The following changes have been made:  no change  Labs/ tests ordered today include: None  No orders of the defined types were placed in this encounter.  Disposition:   FU with Dr. Stanford Frank in 6 months  Signed, Kathyrn Drown, NP  07/18/2018 4:37 PM    [address], [place], [place]  [address] Phone: [phone]; Fax: [phone]

## 2018-07-17 NOTE — Patient Instructions (Addendum)
Your provider has requested that you go to the basement level for lab work before leaving today. Press "B" on the elevator. The lab is located at the first door on the left as you exit the elevator.  Continue protonix twice daily and continue oral iron.   You have been scheduled for an endoscopy. Please follow written instructions given to you at your visit today. If you use inhalers (even only as needed), please bring them with you on the day of your procedure. Your physician has requested that you go to www.startemmi.com and enter the access code given to you at your visit today. This web site gives a general overview about your procedure. However, you should still follow specific instructions given to you by our office regarding your preparation for the procedure.  If your doctor starts your Plavix you will need to hold it 5 days before your procedure.

## 2018-07-18 ENCOUNTER — Encounter (INDEPENDENT_AMBULATORY_CARE_PROVIDER_SITE_OTHER): Payer: Self-pay | Admitting: Physician Assistant

## 2018-07-18 ENCOUNTER — Encounter: Payer: Self-pay | Admitting: Cardiology

## 2018-07-18 ENCOUNTER — Ambulatory Visit (INDEPENDENT_AMBULATORY_CARE_PROVIDER_SITE_OTHER): Payer: Medicare Other | Admitting: Cardiology

## 2018-07-18 VITALS — BP 163/89 | HR 87 | Ht 72.0 in | Wt 186.4 lb

## 2018-07-18 DIAGNOSIS — I10 Essential (primary) hypertension: Secondary | ICD-10-CM | POA: Diagnosis not present

## 2018-07-18 DIAGNOSIS — I951 Orthostatic hypotension: Secondary | ICD-10-CM | POA: Diagnosis not present

## 2018-07-18 DIAGNOSIS — E7800 Pure hypercholesterolemia, unspecified: Secondary | ICD-10-CM | POA: Diagnosis not present

## 2018-07-18 DIAGNOSIS — I714 Abdominal aortic aneurysm, without rupture, unspecified: Secondary | ICD-10-CM

## 2018-07-18 DIAGNOSIS — I639 Cerebral infarction, unspecified: Secondary | ICD-10-CM

## 2018-07-18 DIAGNOSIS — I2510 Atherosclerotic heart disease of native coronary artery without angina pectoris: Secondary | ICD-10-CM

## 2018-07-18 DIAGNOSIS — M545 Low back pain, unspecified: Secondary | ICD-10-CM | POA: Insufficient documentation

## 2018-07-18 NOTE — Patient Instructions (Signed)
Medication Instructions:  Your physician recommends that you continue on your current medications as directed. Please refer to the Current Medication list given to you today. If you need a refill on your cardiac medications before your next appointment, please call your pharmacy.   Lab work: None  If you have labs (blood work) drawn today and your tests are completely normal, you will receive your results only by: . MyChart Message (if you have MyChart) OR . A paper copy in the mail If you have any lab test that is abnormal or we need to change your treatment, we will call you to review the results.  Testing/Procedures: None   Follow-Up: At [facility], you and your health needs are our priority.  As part of our continuing mission to provide you with exceptional heart care, we have created designated Provider Care Teams.  These Care Teams include your primary Cardiologist (physician) and Advanced Practice Providers (APPs -  Physician Assistants and Nurse Practitioners) who all work together to provide you with the care you need, when you need it. You will need a follow up appointment in 6 months.  Please call our office 2 months in advance to schedule this appointment.  You may see Dr Brian Crenshaw or one of the following Advanced Practice Providers on your designated Care Team:   Luke Kilroy, PA-C Krista Kroeger, PA-C . Callie Goodrich, PA-C  Any Other Special Instructions Will Be Listed Below (If Applicable).   

## 2018-07-18 NOTE — Progress Notes (Signed)
Agree with assessment and plan per PA [place]. Hgb returned at 12 thus likely darker stools from his Iron intake. He has slight leukocytosis and this should be followed up by PCP otherwise. I think after 2-weeks without any evidence of recurrent bleeding, Aspirin could be restarted if necessary by Cardiology service. Agree with plan for follow up EGD in April/May to ensure ulcer healing.

## 2018-07-19 ENCOUNTER — Telehealth: Payer: Self-pay

## 2018-07-19 DIAGNOSIS — K269 Duodenal ulcer, unspecified as acute or chronic, without hemorrhage or perforation: Secondary | ICD-10-CM | POA: Diagnosis not present

## 2018-07-19 DIAGNOSIS — K259 Gastric ulcer, unspecified as acute or chronic, without hemorrhage or perforation: Secondary | ICD-10-CM | POA: Diagnosis not present

## 2018-07-19 DIAGNOSIS — E1122 Type 2 diabetes mellitus with diabetic chronic kidney disease: Secondary | ICD-10-CM | POA: Diagnosis not present

## 2018-07-19 DIAGNOSIS — I129 Hypertensive chronic kidney disease with stage 1 through stage 4 chronic kidney disease, or unspecified chronic kidney disease: Secondary | ICD-10-CM | POA: Diagnosis not present

## 2018-07-19 DIAGNOSIS — N183 Chronic kidney disease, stage 3 (moderate): Secondary | ICD-10-CM | POA: Diagnosis not present

## 2018-07-19 MED ORDER — ASPIRIN EC 81 MG PO TBEC: 81.0000 mg | DELAYED_RELEASE_TABLET | Freq: Every day | ORAL | Status: DC

## 2018-07-19 NOTE — Telephone Encounter (Signed)
-----   Message from Tommie Raymond, NP sent at 07/19/2018  9:46 AM EDT ----- Regarding: d/c Plavix Please call the patient and inform him that Dr. Stanford Breed wants him to continue ASA 81 daily and to stop his Plavix.   Thank you  Sharee Pimple

## 2018-07-19 NOTE — Addendum Note (Signed)
Addended by: Ulice Brilliant T on: 07/19/2018 02:44 PM   Modules accepted: Orders

## 2018-07-19 NOTE — Telephone Encounter (Signed)
Pt wife notified (DPR) changed in Epic

## 2018-07-20 ENCOUNTER — Telehealth (INDEPENDENT_AMBULATORY_CARE_PROVIDER_SITE_OTHER): Payer: Self-pay

## 2018-07-20 NOTE — Telephone Encounter (Signed)
Called patient and asked the screening questions.  Do you have now or have you had in the past 7 days a fever and/or chills? NO  Do you have now or have you had in the past 7 days a cough? NO  Do you have now or have you had in the last 7 days nausea, vomiting or abdominal pain? NO  Have you been exposed to anyone who has tested positive for COVID-19? NO  Have you or anyone who lives with you traveled within the last month? NO 

## 2018-07-23 ENCOUNTER — Encounter (INDEPENDENT_AMBULATORY_CARE_PROVIDER_SITE_OTHER): Payer: Self-pay | Admitting: Orthopaedic Surgery

## 2018-07-23 ENCOUNTER — Ambulatory Visit (INDEPENDENT_AMBULATORY_CARE_PROVIDER_SITE_OTHER): Payer: Medicare Other | Admitting: Orthopaedic Surgery

## 2018-07-23 ENCOUNTER — Other Ambulatory Visit: Payer: Self-pay

## 2018-07-23 DIAGNOSIS — M545 Low back pain: Secondary | ICD-10-CM

## 2018-07-23 DIAGNOSIS — M48061 Spinal stenosis, lumbar region without neurogenic claudication: Secondary | ICD-10-CM

## 2018-07-23 NOTE — Progress Notes (Signed)
The patient is here to see me today but is actually seen Dr. Louanne Skye already a week ago.  He has recommended MRI of his lumbar spine and then following up with Dr. Louanne Skye after the lumbar spine MRI.  He already had a intervention by Dr. Ernestina Patches as well.  Since he is now following up with Dr. Louanne Skye he does not really need to see me today.  He has had no other change in his medical status.  His back pain is still significant and he is awaiting an MRI to help determine what is the next treatment options for him.  The MRI is being scheduled.  His follow-up will be with Dr. Louanne Skye after the MRI is obtained.

## 2018-07-25 ENCOUNTER — Ambulatory Visit: Payer: Medicare Other | Admitting: Physical Medicine & Rehabilitation

## 2018-07-27 ENCOUNTER — Other Ambulatory Visit: Payer: Self-pay | Admitting: Internal Medicine

## 2018-07-27 DIAGNOSIS — K269 Duodenal ulcer, unspecified as acute or chronic, without hemorrhage or perforation: Secondary | ICD-10-CM | POA: Diagnosis not present

## 2018-07-27 DIAGNOSIS — K259 Gastric ulcer, unspecified as acute or chronic, without hemorrhage or perforation: Secondary | ICD-10-CM | POA: Diagnosis not present

## 2018-07-27 DIAGNOSIS — I129 Hypertensive chronic kidney disease with stage 1 through stage 4 chronic kidney disease, or unspecified chronic kidney disease: Secondary | ICD-10-CM | POA: Diagnosis not present

## 2018-07-27 DIAGNOSIS — E1122 Type 2 diabetes mellitus with diabetic chronic kidney disease: Secondary | ICD-10-CM | POA: Diagnosis not present

## 2018-07-27 DIAGNOSIS — N183 Chronic kidney disease, stage 3 (moderate): Secondary | ICD-10-CM | POA: Diagnosis not present

## 2018-07-30 ENCOUNTER — Ambulatory Visit: Payer: Medicare Other | Admitting: Physical Medicine & Rehabilitation

## 2018-07-30 ENCOUNTER — Ambulatory Visit (INDEPENDENT_AMBULATORY_CARE_PROVIDER_SITE_OTHER): Payer: Self-pay | Admitting: Orthopaedic Surgery

## 2018-07-31 ENCOUNTER — Other Ambulatory Visit: Payer: Self-pay | Admitting: Adult Health

## 2018-07-31 ENCOUNTER — Ambulatory Visit (INDEPENDENT_AMBULATORY_CARE_PROVIDER_SITE_OTHER): Payer: Medicare Other | Admitting: Orthopaedic Surgery

## 2018-07-31 ENCOUNTER — Other Ambulatory Visit: Payer: Self-pay

## 2018-07-31 ENCOUNTER — Ambulatory Visit: Payer: Medicare Other | Admitting: Gastroenterology

## 2018-07-31 ENCOUNTER — Encounter (INDEPENDENT_AMBULATORY_CARE_PROVIDER_SITE_OTHER): Payer: Self-pay

## 2018-08-03 ENCOUNTER — Other Ambulatory Visit: Payer: Self-pay

## 2018-08-03 ENCOUNTER — Ambulatory Visit (INDEPENDENT_AMBULATORY_CARE_PROVIDER_SITE_OTHER): Payer: Medicare Other | Admitting: *Deleted

## 2018-08-03 DIAGNOSIS — I639 Cerebral infarction, unspecified: Secondary | ICD-10-CM

## 2018-08-03 LAB — CUP PACEART REMOTE DEVICE CHECK
Date Time Interrogation Session: 20200403143823
Implantable Pulse Generator Implant Date: 20191021

## 2018-08-06 ENCOUNTER — Telehealth: Payer: Self-pay

## 2018-08-06 ENCOUNTER — Other Ambulatory Visit (INDEPENDENT_AMBULATORY_CARE_PROVIDER_SITE_OTHER): Payer: Self-pay | Admitting: Specialist

## 2018-08-06 ENCOUNTER — Telehealth (INDEPENDENT_AMBULATORY_CARE_PROVIDER_SITE_OTHER): Payer: Self-pay | Admitting: Radiology

## 2018-08-06 MED ORDER — TRAMADOL HCL 50 MG PO TABS: 50.0000 mg | ORAL_TABLET | Freq: Four times a day (QID) | ORAL | 0 refills | Status: DC | PRN

## 2018-08-06 NOTE — Telephone Encounter (Signed)
Patient is still having a lot of pain in hip and down his legs.  Had seen orthopedics and supposed to get MRI but they are closed as well as Ortho office.  Wants to know if we can do anything to help him. Please advise.

## 2018-08-06 NOTE — Telephone Encounter (Signed)
This is more of a [place] patient.  We saw him recently as a work-in.  Not sure what else can be offered.  See what Dr. Louanne Skye thinks.

## 2018-08-06 NOTE — Telephone Encounter (Signed)
Patient advised.

## 2018-08-06 NOTE — Telephone Encounter (Signed)
Patient states that he is in a lot of pain, and states that he can't take it much, states that he has not had the MRI yet since they are not scheduling them at this time. He states that he needs something done. Please advise

## 2018-08-06 NOTE — Telephone Encounter (Signed)
I called and spoke with Mr. Raucci, he is having increasing pain and leg weakness, [facility] plans to perform MRI on Thursday. He would like to have something to help with the pain. Dr. Melford Aase had prescribed Tramadol 50 mg every 6 hours and this helped, I sent a Rx to his pharmacy renewing this medication #30. Will discuss results of the study when available Thursday.

## 2018-08-07 DIAGNOSIS — M5134 Other intervertebral disc degeneration, thoracic region: Secondary | ICD-10-CM | POA: Diagnosis not present

## 2018-08-07 DIAGNOSIS — M9902 Segmental and somatic dysfunction of thoracic region: Secondary | ICD-10-CM | POA: Diagnosis not present

## 2018-08-07 NOTE — Telephone Encounter (Signed)
noted 

## 2018-08-09 ENCOUNTER — Ambulatory Visit: Payer: Self-pay | Admitting: Internal Medicine

## 2018-08-09 ENCOUNTER — Ambulatory Visit
Admission: RE | Admit: 2018-08-09 | Discharge: 2018-08-09 | Disposition: A | Discharge status: Home / Self Care | Payer: Medicare Other | Source: Ambulatory Visit | Attending: Specialist | Admitting: Specialist

## 2018-08-09 ENCOUNTER — Other Ambulatory Visit: Payer: Self-pay

## 2018-08-09 ENCOUNTER — Other Ambulatory Visit (INDEPENDENT_AMBULATORY_CARE_PROVIDER_SITE_OTHER): Payer: Self-pay | Admitting: Specialist

## 2018-08-09 DIAGNOSIS — M4726 Other spondylosis with radiculopathy, lumbar region: Secondary | ICD-10-CM

## 2018-08-09 DIAGNOSIS — M48061 Spinal stenosis, lumbar region without neurogenic claudication: Secondary | ICD-10-CM | POA: Diagnosis not present

## 2018-08-09 DIAGNOSIS — M4807 Spinal stenosis, lumbosacral region: Secondary | ICD-10-CM

## 2018-08-09 DIAGNOSIS — M4316 Spondylolisthesis, lumbar region: Secondary | ICD-10-CM

## 2018-08-09 NOTE — Progress Notes (Signed)
Carelink Summary Report / Loop Recorder 

## 2018-08-09 NOTE — Progress Notes (Signed)
MRI result reviewed with this patient, I recommend ESI left interlaminar L3-4 and right transforamenal L5. If he is able to get relief then fusion L3 to S1 may be necessary if he is decompressed. [name]

## 2018-08-14 ENCOUNTER — Telehealth (INDEPENDENT_AMBULATORY_CARE_PROVIDER_SITE_OTHER): Payer: Self-pay | Admitting: Physical Medicine and Rehabilitation

## 2018-08-14 NOTE — Telephone Encounter (Signed)
Notification or Prior Authorization is not required for the requested services  This [employer] members plan does not currently require a prior authorization for [id number]  Decision ID #:[id number]

## 2018-08-14 NOTE — Progress Notes (Signed)
History of Present Illness:      This very nice 85 y.o. MWM presents for 6 month follow up with HTN, HLD, Pre-Diabetes and Vitamin D Deficiency.  Patient's wife reports that he has had poor intake  - food & fluids and has last 13# in the last 3 months.Patient reports loss of appetite. He feels less steady walking - even with his walker.       Patient is treated for HTN & ASCAD s/p CABG (2017) and Dysautonomia with labile Postural Hypotension and supine HTN.  Today's BP by the nurse was 184/100 sitting. Sitting BP rechecked was  162/107 w/P  83 and standing BP 126/74 w/P  96.  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 He has a known 3.8 cm AAA. Patient has had no complaints of any cardiac type chest pain, palpitations, dyspnea / orthopnea / PND, claudication, or dependent edema. He does have hx/o postural dizziness and uses a walker.       In Feb 2020, patient was hospitalized with a GI Bleed w/Hgb dropping from 9.0 gm% to 6.3 gm%, transfused 2 u pRBC and EGD found DU x 2 & he has tx'd w/ Protonix.        Hyperlipidemia is controlled with diet & meds. Patient denies myalgias or other med SE's. Last Lipids were at goal: Lab Results  Component Value Date   CHOL 132 05/24/2018   HDL 39 (L) 05/24/2018   LDLCALC 70 05/24/2018   TRIG 142 05/24/2018   CHOLHDL 3.4 05/24/2018       Also, the patient has history of PreDM (2008) managed w/diet and has had no symptoms of reactive hypoglycemia, diabetic polys, paresthesias or visual blurring.  Last A1c was not at goal: Lab Results  Component Value Date   HGBA1C 6.3 (H) 05/24/2018       Further, the patient also has history of Vitamin D Deficiency and supplements vitamin D without any suspected side-effects. Last vitamin D was at goal: Lab Results  Component Value Date   VD25OH 91 01/30/2018   Current Outpatient Medications on File Prior to Visit  Medication Sig  . acetaminophen (TYLENOL) 500 MG tablet Take 1,000 mg by  mouth 2 (two) times daily as needed for headache (pain).   . Ascorbic Acid (VITAMIN C) 500 MG CAPS Take 1 capsule by mouth daily.  Marland Kitchen aspirin EC 81 MG tablet Take 1 tablet (81 mg total) by mouth daily.  . butalbital-acetaminophen-caffeine (FIORICET, ESGIC) 50-325-40 MG tablet Take 1 tablet every 4 hours ONLY if needed for severe Headache (Patient taking differently: Take 1 tablet by mouth daily as needed (severe headache). )  . Calcium Carb-Cholecalciferol (CALCIUM+D3 PO) Take 1 tablet by mouth daily.  . Cholecalciferol (VITAMIN D-3) 5000 units TABS Take 5,000 Units by mouth daily.  . Ferrous Sulfate (IRON SLOW RELEASE) 140 (45 Fe) MG TBCR Take 1 tablet by mouth daily.  . fludrocortisone (FLORINEF) 0.1 MG tablet Take 0.1 mg by mouth 2 (two) times daily.   Marland Kitchen gabapentin (NEURONTIN) 100 MG capsule TAKE 2 CAPSULE(200 MG) BY MOUTH 2 TIMES A DAY. (Patient taking differently: Take 200 mg by mouth 2 (two) times daily. )  . glucose blood test strip Check blood sugar 1 time daily-[id number]  . Magnesium 500 MG TABS Take 1 tablet by mouth daily.  Marland Kitchen MAGNESIUM PO Take 1,000 mg by mouth daily.  . Multiple Vitamin (MULTIVITAMIN WITH MINERALS) TABS tablet Take 1 tablet by mouth daily.  Marland Kitchen  olmesartan (BENICAR) 40 MG tablet Take 40 mg by mouth daily. Take 1/2 to 1 tablet daily for blood pressure.  . Omega-3 Fatty Acids (FISH OIL OMEGA-3 PO) Take 1 capsule by mouth daily.  . pantoprazole (PROTONIX) 40 MG tablet TAKE 1 TABLET BY MOUTH TWICE A DAY FOR A MONTH THEN TAKE 1 TABLET DAILY THEREAFTER (Patient taking differently: Take 40 mg by mouth daily. )  . polyethylene glycol (MIRALAX / GLYCOLAX) packet Take 17 g by mouth daily. (Patient taking differently: Take 17 g by mouth 2 (two) times daily. )  . POTASSIUM CHLORIDE PO Take by mouth daily.  . vitamin B-12 (CYANOCOBALAMIN) 1000 MCG tablet Take 1,000 mcg by mouth daily.  . traMADol (ULTRAM) 50 MG tablet Take 1 tablet (50 mg total) by mouth every 6 (six) hours as needed  for up to 7 days.   No current facility-administered medications on file prior to visit.    Allergies  Allergen Reactions  . Cymbalta [Duloxetine Hcl] Other (See Comments)    Dizziness  . Keflex [Cephalexin] Other (See Comments)    dizziness  . Simvastatin Other (See Comments)    Joint pain  . Sudafed [Pseudoephedrine] Other (See Comments)    Dizziness   PMHx:   Past Medical History:  Diagnosis Date  . 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 Colon polyps   . Coronary atherosclerosis of unspecified type of vessel, native or graft   . Diabetes ([place])   . Difficult intubation   . Esophageal reflux   . High cholesterol   . History of IBS 02/27/2009  . Hypertension    pt denies, he says he has a h/o hypotension. If BP up he adjusts the Florinef  . Orthostatic hypotension    "BP has been dropping alot when I stand up for the last month or so" (02/17/2016)  . Vitamin B 12 deficiency   . Vitamin D deficiency    Immunization History  Administered Date(s) Administered  . Influenza, High Dose Seasonal PF 12/30/2013, 02/02/2015, 01/27/2016  . Influenza-Unspecified 01/13/2013, 01/29/2017, 01/30/2018  . Pneumococcal Conjugate-13 02/14/2014  . Pneumococcal-Unspecified 06/15/2004  . Tetanus 10/01/2012  . Zoster 10/01/2012   Past Surgical History:  Procedure Laterality Date  . BIOPSY  06/25/2018   Procedure: BIOPSY;  Surgeon: Rush Landmark Telford Nab., MD;  Location: [facility];  Service: Gastroenterology;;  . BLADDER SURGERY  1969   traumatic pelvic fractures, urethral and bladder repair  . CARDIAC CATHETERIZATION N/A 07/01/2015   Procedure: Left Heart Cath and Coronary Angiography;  Surgeon: Wellington Hampshire, MD;  Location: [facility];  Service: Cardiovascular;  Laterality: N/A;  . COLON RESECTION N/A 05/17/2017   Procedure: DIAGNOSTIC LAPAROSCOPY,;  Surgeon: Leighton Ruff, MD;  Location: [facility];  Service: General;  Laterality: N/A;  . CORONARY ANGIOPLASTY WITH STENT PLACEMENT     . CORONARY ARTERY BYPASS GRAFT N/A 07/06/2015   Procedure: CORONARY ARTERY BYPASS GRAFTING (CABG)x 4   utilizing the left internal mammary artery and endoscopically harvested bilateral  sapheneous vein.;  Surgeon: Ivin Poot, MD;  Location: [place];  Service: Open Heart Surgery;  Laterality: N/A;  . ESOPHAGOGASTRODUODENOSCOPY (EGD) WITH PROPOFOL N/A 06/25/2018   Procedure: ESOPHAGOGASTRODUODENOSCOPY (EGD) WITH PROPOFOL;  Surgeon: Rush Landmark Telford Nab., MD;  Location: [facility];  Service: Gastroenterology;  Laterality: N/A;  . KNEE SURGERY    . LOOP RECORDER INSERTION N/A 02/19/2018   Procedure: LOOP RECORDER INSERTION;  Surgeon: Deboraha Sprang, MD;  Location: [facility];  Service:  Cardiovascular;  Laterality: N/A;  . TEE WITHOUT CARDIOVERSION N/A 07/06/2015   Procedure: TRANSESOPHAGEAL ECHOCARDIOGRAM (TEE);  Surgeon: Ivin Poot, MD;  Location: [facility];  Service: Open Heart Surgery;  Laterality: N/A;   FHx:    Reviewed / unchanged SHx:    Reviewed / unchanged   Systems Review:  Constitutional: Denies fever, chills, wt changes, headaches, insomnia, fatigue, night sweats, change in appetite. Eyes: Denies redness, blurred vision, diplopia, discharge, itchy, watery eyes.  ENT: Denies discharge, congestion, post nasal drip, epistaxis, sore throat, earache, hearing loss, dental pain, tinnitus, vertigo, sinus pain, snoring.  CV: Denies chest pain, palpitations, irregular heartbeat, syncope, dyspnea, diaphoresis, orthopnea, PND, claudication or edema. Respiratory: denies cough, dyspnea, DOE, pleurisy, hoarseness, laryngitis, wheezing.  Gastrointestinal: Denies dysphagia, odynophagia, heartburn, reflux, water brash, abdominal pain or cramps, nausea, vomiting, bloating, diarrhea, constipation, hematemesis, melena, hematochezia  or hemorrhoids. Genitourinary: Denies dysuria, frequency, urgency, nocturia, hesitancy, discharge, hematuria or flank pain. Musculoskeletal: Denies arthralgias,  myalgias, stiffness, jt. swelling, pain, limping or strain/sprain.  Skin: Denies pruritus, rash, hives, warts, acne, eczema or change in skin lesion(s). Neuro: No weakness, tremor, incoordination, spasms, paresthesia or pain. Psychiatric: Denies confusion, memory loss or sensory loss. Endo: Denies change in weight, skin or hair change.  Heme/Lymph: No excessive bleeding, bruising or enlarged lymph nodes.  Physical Exam  BP (!) 184/100   Pulse 88   Temp (!) 97 F (36.1 C)   Resp 16   Ht 6' (1.829 m)   Wt 185 lb (83.9 kg)   BMI 25.09 kg/m    Sitting BP 162/107   P 83   &     Standing BP  116/74    P 96  Appears  well nourished, well groomed  and in no distress.  Eyes: PERRLA, EOMs, conjunctiva no swelling or erythema. Sinuses: No frontal/maxillary tenderness ENT/Mouth: EAC's clear, TM's nl w/o erythema, bulging. Nares clear w/o erythema, swelling, exudates. Oropharynx clear without erythema or exudates. Oral hygiene is good. Tongue normal, non obstructing. Hearing intact.  Neck: Supple. Thyroid not palpable. Car 2+/2+ without bruits, nodes or JVD. Chest: Respirations nl with BS clear & equal w/o rales, rhonchi, wheezing or stridor.  Cor: Heart sounds normal w/ regular rate and rhythm without sig. murmurs, gallops, clicks or rubs. Peripheral pulses normal and equal  without edema.  Abdomen: Soft & bowel sounds normal. Non-tender w/o guarding, rebound, hernias, masses or organomegaly.  Lymphatics: Unremarkable.  Musculoskeletal: Full ROM all peripheral extremities, joint stability, 5/5 strength and normal gait.  Skin: Warm, dry without exposed rashes or ecchymosis apparent.  There is a 1.5 cm skin lesion Rt sideburn suspect for BCE  Neuro: Cranial nerves intact, reflexes equal bilaterally. Sensory-motor testing grossly intact. Tendon reflexes grossly intact.  Pysch: Alert & oriented x 3.  Insight and judgement nl & appropriate. No ideations.  Assessment and Plan:  - Continue  medication, monitor blood pressure at home.  - Continue DASH diet.  Reminder to go to the ER if any CP,  SOB, nausea, dizziness, severe HA, changes vision/speech.  - Continue diet/meds, exercise,& lifestyle modifications.  - Continue monitor periodic cholesterol/liver & renal functions   1. Supine hypertension  - CBC with Differential/Platelet - COMPLETE METABOLIC PANEL WITH GFR - Magnesium - TSH  2. Dysautonomia orthostatic hypotension syndrome (HCC)  - CBC with Differential/Platelet - COMPLETE METABOLIC PANEL WITH GFR  3. Hyperlipidemia, mixed  - Continue diet, exercise  - Lifestyle modifications.  - Monitor appropriate labs.  - Lipid panel - TSH  4. CKD stage 3 due to type 2 diabetes mellitus (HCC)  - Continue supplementation.  - Hemoglobin A1c - Insulin, random  5. Vitamin D deficiency  - VITAMIN D 25 Hydroxyl  6. Coronary artery disease involving coronary bypass graft of native heart without angina pectoris  - Lipid panel  7. Duodenal ulcer disease  - CBC with Differential/Platelet  8. Medication management  - CBC with Differential/Platelet - COMPLETE METABOLIC PANEL WITH GFR - Magnesium - Lipid panel - TSH - Hemoglobin A1c - Insulin, random - VITAMIN D 25 Hydroxyl  9. Adrenal insufficiency (HCC) to r/o   - predniSONE (DELTASONE) 10 MG tablet; Take 1 tablet 3 x /day or as directed  Dispense: 100 tablet;   - advised to start 1 tab (10 mg) 2 x /day & return in 10-14 days to excise a BCE of the Rt Cheek  - Cortisol        Discussed  regular exercise, BP monitoring, weight control to achieve/maintain BMI less than 25 and discussed med and SE's. Recommended labs to assess and monitor clinical status with further disposition pending results of labs. I discussed the assessment and treatment plan with the patient. The patient was provided an opportunity to ask questions and all were answered. The patient agreed with the plan and demonstrated an  understanding of the instructions and over 30 minutes of exam, counseling, chart review was performed.          Kirtland Bouchard, MD

## 2018-08-15 ENCOUNTER — Ambulatory Visit (INDEPENDENT_AMBULATORY_CARE_PROVIDER_SITE_OTHER): Payer: Medicare Other | Admitting: Physical Medicine and Rehabilitation

## 2018-08-15 ENCOUNTER — Encounter: Payer: Self-pay | Admitting: Internal Medicine

## 2018-08-15 ENCOUNTER — Ambulatory Visit: Payer: Medicare Other | Admitting: Internal Medicine

## 2018-08-15 ENCOUNTER — Other Ambulatory Visit: Payer: Self-pay

## 2018-08-15 ENCOUNTER — Ambulatory Visit (INDEPENDENT_AMBULATORY_CARE_PROVIDER_SITE_OTHER): Payer: Self-pay

## 2018-08-15 VITALS — BP 148/105 | HR 84 | Ht 72.0 in | Wt 180.0 lb

## 2018-08-15 VITALS — BP 184/100 | HR 88 | Temp 97.0°F | Resp 16 | Ht 72.0 in | Wt 185.0 lb

## 2018-08-15 DIAGNOSIS — M5416 Radiculopathy, lumbar region: Secondary | ICD-10-CM

## 2018-08-15 DIAGNOSIS — N183 Chronic kidney disease, stage 3 unspecified: Secondary | ICD-10-CM

## 2018-08-15 DIAGNOSIS — E782 Mixed hyperlipidemia: Secondary | ICD-10-CM | POA: Diagnosis not present

## 2018-08-15 DIAGNOSIS — M48061 Spinal stenosis, lumbar region without neurogenic claudication: Secondary | ICD-10-CM | POA: Diagnosis not present

## 2018-08-15 DIAGNOSIS — E1122 Type 2 diabetes mellitus with diabetic chronic kidney disease: Secondary | ICD-10-CM | POA: Diagnosis not present

## 2018-08-15 DIAGNOSIS — K269 Duodenal ulcer, unspecified as acute or chronic, without hemorrhage or perforation: Secondary | ICD-10-CM

## 2018-08-15 DIAGNOSIS — I951 Orthostatic hypotension: Secondary | ICD-10-CM

## 2018-08-15 DIAGNOSIS — E2740 Unspecified adrenocortical insufficiency: Secondary | ICD-10-CM

## 2018-08-15 DIAGNOSIS — G903 Multi-system degeneration of the autonomic nervous system: Secondary | ICD-10-CM

## 2018-08-15 DIAGNOSIS — E559 Vitamin D deficiency, unspecified: Secondary | ICD-10-CM | POA: Diagnosis not present

## 2018-08-15 DIAGNOSIS — I10 Essential (primary) hypertension: Secondary | ICD-10-CM

## 2018-08-15 DIAGNOSIS — I25810 Atherosclerosis of coronary artery bypass graft(s) without angina pectoris: Secondary | ICD-10-CM

## 2018-08-15 DIAGNOSIS — Z79899 Other long term (current) drug therapy: Secondary | ICD-10-CM

## 2018-08-15 HISTORY — DX: Duodenal ulcer, unspecified as acute or chronic, without hemorrhage or perforation: K26.9

## 2018-08-15 MED ORDER — PREDNISONE 10 MG PO TABS: ORAL_TABLET | ORAL | 0 refills | Status: DC

## 2018-08-15 MED ORDER — BETAMETHASONE SOD PHOS & ACET 6 (3-3) MG/ML IJ SUSP
12.0000 mg | Freq: Once | INTRAMUSCULAR | Status: AC
  Administered 2018-08-15: 12 mg

## 2018-08-15 NOTE — Progress Notes (Signed)
Pt states pain in lower back, both hips (groin pain), both thighs. Pt states pain started a long time ago. Pt states walking makes pain worse, ice pack helps with pain. Pt also states legs give out on him while walking. Pt states he has slept in his recliner for about a week.   .Numeric Pain Rating Scale and Functional Assessment Average Pain 7 Pain Right Now 8 My pain is constant, sharp and aching Pain is worse with: walking and standing Pain improves with: heat/ice   In the last MONTH (on 0-10 scale) has pain interfered with the following?  1. General activity like being  able to carry out your everyday physical activities such as walking, climbing stairs, carrying groceries, or moving a chair?  Rating(10)  2. Relation with others like being able to carry out your usual social activities and roles such as  activities at home, at work and in your community. Rating(2)  3. Enjoyment of life such that you have  been bothered by emotional problems such as feeling anxious, depressed or irritable?  Rating(8)  .

## 2018-08-15 NOTE — Progress Notes (Signed)
[name] - 85 y.o. male MRN [id number]  Date of birth: 04-20-1934  Office Visit Note: Visit Date: 08/15/2018 PCP: [name], MD Referred by: [name], MD  Subjective: Chief Complaint  Patient presents with  . Lower Back - Pain  . Right Thigh - Pain  . Left Thigh - Pain  . Left Hip - Pain  . Right Hip - Pain   HPI:  [name] is a 85 y.o. male who comes in today For planned left L3-4 interlaminar epidural steroid injection and right L5 transforaminal injection.  This comes at the request of Dr. Basil Dess who is seen the patient in follow-up and has had new MRI which is reviewed below.  Patient is having low back pain and bilateral hip and leg pain.  He has lateral recess stenosis more left than right and foraminal narrowing at L5 more right than left.  He is failed conservative care otherwise and is in severe pain.  Prior right L4 transforaminal injection was not very beneficial.  Review of Systems  All other systems reviewed and are negative.  Otherwise per HPI.  Assessment & Plan: Visit Diagnoses:  1. Lumbar radiculopathy   2. Bilateral stenosis of lateral recess of lumbar spine   3. Foraminal stenosis of lumbar region     Plan: No additional findings.   Meds & Orders:  Meds ordered this encounter  Medications  . betamethasone acetate-betamethasone sodium phosphate (CELESTONE) injection 12 mg    Orders Placed This Encounter  Procedures  . XR C-ARM NO REPORT  . Epidural Steroid injection    Follow-up: Return for Basil Dess, MD.   Procedures: No procedures performed  Lumbosacral Transforaminal Epidural Steroid Injection - Sub-Pedicular Approach with Fluoroscopic Guidance  Patient: [name]      Date of Birth: 1933-07-14 MRN: [id number] PCP: [name], MD      Visit Date: 08/15/2018   Universal Protocol:    Date/Time: 08/15/2018  Consent Given By: the patient  Position: PRONE  Additional Comments: Vital signs  were monitored before and after the procedure. Patient was prepped and draped in the usual sterile fashion. The correct patient, procedure, and site was verified.   Injection Procedure Details:  Procedure Site One Meds Administered:  Meds ordered this encounter  Medications  . betamethasone acetate-betamethasone sodium phosphate (CELESTONE) injection 12 mg    Laterality: Right  Location/Site:  L5-S1  Needle size: 22 G  Needle type: Spinal  Needle Placement: Transforaminal  Findings:    -Comments: Excellent flow of contrast along the nerve and into the epidural space.  Procedure Details: After squaring off the end-plates to get a true AP view, the C-arm was positioned so that an oblique view of the foramen as noted above was visualized. The target area is just inferior to the "nose of the scotty dog" or sub pedicular. The soft tissues overlying this structure were infiltrated with 2-3 ml. of 1% Lidocaine without Epinephrine.  The spinal needle was inserted toward the target using a "trajectory" view along the fluoroscope beam.  Under AP and lateral visualization, the needle was advanced so it did not puncture dura and was located close the 6 O'Clock position of the pedical in AP tracterory. Biplanar projections were used to confirm position. Aspiration was confirmed to be negative for CSF and/or blood. A 1-2 ml. volume of Isovue-250 was injected and flow of contrast was noted at each level. Radiographs were obtained for documentation purposes.   After attaining the  desired flow of contrast documented above, a 0.5 to 1.0 ml test dose of 0.25% Marcaine was injected into each respective transforaminal space.  The patient was observed for 90 seconds post injection.  After no sensory deficits were reported, and normal lower extremity motor function was noted,   the above injectate was administered so that equal amounts of the injectate were placed at each foramen (level) into the  transforaminal epidural space.   Additional Comments:  The patient tolerated the procedure well Dressing: 2 x 2 sterile gauze and Band-Aid    Post-procedure details: Patient was observed during the procedure. Post-procedure instructions were reviewed.  Patient left the clinic in stable condition.  Lumbar Epidural Steroid Injection - Interlaminar Approach with Fluoroscopic Guidance  Patient: [name]      Date of Birth: [birth year] MRN: [id number] PCP: [name], MD      Visit Date: 08/15/2018   Universal Protocol:     Consent Given By: the patient  Position: PRONE  Additional Comments: Vital signs were monitored before and after the procedure. Patient was prepped and draped in the usual sterile fashion. The correct patient, procedure, and site was verified.   Injection Procedure Details:  Procedure Site One Meds Administered:  Meds ordered this encounter  Medications  . betamethasone acetate-betamethasone sodium phosphate (CELESTONE) injection 12 mg     Laterality: Left  Location/Site:  L3-L4  Needle size: 20 G  Needle type: Tuohy  Needle Placement: Paramedian epidural  Findings:   -Comments: Excellent flow of contrast into the epidural space.  Procedure Details: Using a paramedian approach from the side mentioned above, the region overlying the inferior lamina was localized under fluoroscopic visualization and the soft tissues overlying this structure were infiltrated with 4 ml. of 1% Lidocaine without Epinephrine. The Tuohy needle was inserted into the epidural space using a paramedian approach.   The epidural space was localized using loss of resistance along with lateral and bi-planar fluoroscopic views.  After negative aspirate for air, blood, and CSF, a 2 ml. volume of Isovue-250 was injected into the epidural space and the flow of contrast was observed. Radiographs were obtained for documentation purposes.    The injectate was  administered into the level noted above.   Additional Comments:  The patient tolerated the procedure well Dressing: 2 x 2 sterile gauze and Band-Aid    Post-procedure details: Patient was observed during the procedure. Post-procedure instructions were reviewed.  Patient left the clinic in stable condition.   Clinical History: MRI LUMBAR SPINE WITHOUT CONTRAST  TECHNIQUE: Multiplanar, multisequence MR imaging of the lumbar spine was performed. No intravenous contrast was administered.  COMPARISON:  09/13/2016  FINDINGS: Segmentation:  5 lumbar type vertebrae  Alignment:  Dextroscoliosis.  Straightening of the lumbar spine.  Vertebrae:  No fracture, evidence of discitis, or bone lesion.  Conus medullaris and cauda equina: Conus extends to the L1-2 level. Conus and cauda equina appear normal.  Paraspinal and other soft tissues: Aortic and iliac aneurysmal enlargement. The abdominal aorta measures up to 3.3 cm where visualized and left common iliac artery measures up to 2.9 cm  Disc levels:  T12- L1: Spondylosis.  No impingement  L1-L2: Spondylosis.  No impingement  L2-L3: Spondylosis with disc narrowing and mild bulging. No impingement  L3-L4: Severe degenerative disc disease with left-sided collapse and asymmetric spurring. There is moderate left foraminal stenosis. Left subarticular recess narrowing without static L4 compression  L4-L5: Asymmetric leftward degenerative disc collapse with bulge and endplate spurring. Bilateral  subarticular recess stenosis with apparent flattening of the left L5 nerve root. There is moderate left more than right foraminal narrowing  L5-S1:Spondylosis and disc degeneration with moderate borderline advanced right foraminal stenosis. The canal is patent  IMPRESSION: 1. Advanced degenerative disease with scoliosis. 2. L3-4 moderate left foraminal narrowing. 3. L4-5 left subarticular recess stenosis and L5  impingement. Moderate bilateral foraminal narrowing. 4. L5-S1 moderate right foraminal narrowing. 5. Aortic and iliac aneurysms without apparent change from 2019 abdominal CT.   Electronically Signed   By: Monte Fantasia M.D.   On: 08/09/2018 09:26     Objective:  VS:  HT:6' (182.9 cm)   WT:180 lb (81.6 kg)  BMI:24.41    BP:(!) 148/105  HR:84bpm  TEMP: ( )  RESP:99 % Physical Exam Musculoskeletal:     Comments: Patient ambulating with walker with a forward flexed lumbar spine.  Good distal strength.     Ortho Exam Imaging: No results found.

## 2018-08-15 NOTE — Procedures (Signed)
Lumbosacral Transforaminal Epidural Steroid Injection - Sub-Pedicular Approach with Fluoroscopic Guidance  Patient: [name]      Date of Birth: [birth year] MRN: [id number] PCP: [name], MD      Visit Date: 08/15/2018   Universal Protocol:    Date/Time: 08/15/2018  Consent Given By: the patient  Position: PRONE  Additional Comments: Vital signs were monitored before and after the procedure. Patient was prepped and draped in the usual sterile fashion. The correct patient, procedure, and site was verified.   Injection Procedure Details:  Procedure Site One Meds Administered:  Meds ordered this encounter  Medications  . betamethasone acetate-betamethasone sodium phosphate (CELESTONE) injection 12 mg    Laterality: Right  Location/Site:  L5-S1  Needle size: 22 G  Needle type: Spinal  Needle Placement: Transforaminal  Findings:    -Comments: Excellent flow of contrast along the nerve and into the epidural space.  Procedure Details: After squaring off the end-plates to get a true AP view, the C-arm was positioned so that an oblique view of the foramen as noted above was visualized. The target area is just inferior to the "nose of the scotty dog" or sub pedicular. The soft tissues overlying this structure were infiltrated with 2-3 ml. of 1% Lidocaine without Epinephrine.  The spinal needle was inserted toward the target using a "trajectory" view along the fluoroscope beam.  Under AP and lateral visualization, the needle was advanced so it did not puncture dura and was located close the 6 O'Clock position of the pedical in AP tracterory. Biplanar projections were used to confirm position. Aspiration was confirmed to be negative for CSF and/or blood. A 1-2 ml. volume of Isovue-250 was injected and flow of contrast was noted at each level. Radiographs were obtained for documentation purposes.   After attaining the desired flow of contrast documented above, a 0.5  to 1.0 ml test dose of 0.25% Marcaine was injected into each respective transforaminal space.  The patient was observed for 90 seconds post injection.  After no sensory deficits were reported, and normal lower extremity motor function was noted,   the above injectate was administered so that equal amounts of the injectate were placed at each foramen (level) into the transforaminal epidural space.   Additional Comments:  The patient tolerated the procedure well Dressing: 2 x 2 sterile gauze and Band-Aid    Post-procedure details: Patient was observed during the procedure. Post-procedure instructions were reviewed.  Patient left the clinic in stable condition.  Lumbar Epidural Steroid Injection - Interlaminar Approach with Fluoroscopic Guidance  Patient: [name]      Date of Birth: Oct 23, 1933 MRN: [id number] PCP: [name], MD      Visit Date: 08/15/2018   Universal Protocol:     Consent Given By: the patient  Position: PRONE  Additional Comments: Vital signs were monitored before and after the procedure. Patient was prepped and draped in the usual sterile fashion. The correct patient, procedure, and site was verified.   Injection Procedure Details:  Procedure Site One Meds Administered:  Meds ordered this encounter  Medications  . betamethasone acetate-betamethasone sodium phosphate (CELESTONE) injection 12 mg     Laterality: Left  Location/Site:  L3-L4  Needle size: 20 G  Needle type: Tuohy  Needle Placement: Paramedian epidural  Findings:   -Comments: Excellent flow of contrast into the epidural space.  Procedure Details: Using a paramedian approach from the side mentioned above, the region overlying the inferior lamina was localized under fluoroscopic visualization and  the soft tissues overlying this structure were infiltrated with 4 ml. of 1% Lidocaine without Epinephrine. The Tuohy needle was inserted into the epidural space using a  paramedian approach.   The epidural space was localized using loss of resistance along with lateral and bi-planar fluoroscopic views.  After negative aspirate for air, blood, and CSF, a 2 ml. volume of Isovue-250 was injected into the epidural space and the flow of contrast was observed. Radiographs were obtained for documentation purposes.    The injectate was administered into the level noted above.   Additional Comments:  The patient tolerated the procedure well Dressing: 2 x 2 sterile gauze and Band-Aid    Post-procedure details: Patient was observed during the procedure. Post-procedure instructions were reviewed.  Patient left the clinic in stable condition.

## 2018-08-15 NOTE — Patient Instructions (Signed)
Coronavirus (COVID-19) Are you at risk?  Are you at risk for the Coronavirus (COVID-19)?  To be considered HIGH RISK for Coronavirus (COVID-19), you have to meet the following criteria:  . Traveled to [place], [place], [place], [place] or [place]; or in the [place] to [place], [place], [place]  . or [place]; and have fever, cough, and shortness of breath within the last 2 weeks of travel OR . Been in close contact with a person diagnosed with COVID-19 within the last 2 weeks and have  . fever, cough,and shortness of breath .  . IF YOU DO NOT MEET THESE CRITERIA, YOU ARE CONSIDERED LOW RISK FOR COVID-19.  What to do if you are HIGH RISK for COVID-19?  . If you are having a medical emergency, call 911. . Seek medical care right away. Before you go to a doctor's office, urgent care or emergency department, .  call ahead and tell them about your recent travel, contact with someone diagnosed with COVID-19  .  and your symptoms.  . You should receive instructions from your physician's office regarding next steps of care.  . When you arrive at healthcare provider, tell the healthcare staff immediately you have returned from  . visiting [place], [place], [place], [place] or [place]; or traveled in the [place] to [place], [place],  . [place] or [place] in the last two weeks or you have been in close contact with a person diagnosed with  . COVID-19 in the last 2 weeks.   . Tell the health care staff about your symptoms: fever, cough and shortness of breath. . After you have been seen by a medical provider, you will be either: o Tested for (COVID-19) and discharged home on quarantine except to seek medical care if  o symptoms worsen, and asked to  - Stay home and avoid contact with others until you get your results (4-5 days)  - Avoid travel on public transportation if possible (such as bus, train, or airplane) or o Sent to the Emergency Department by EMS for evaluation,  COVID-19 testing  and  o possible admission depending on your condition and test results.  What to do if you are LOW RISK for COVID-19?  Reduce your risk of any infection by using the same precautions used for avoiding the common cold or flu:  . Wash your hands often with soap and warm water for at least 20 seconds.  If soap and water are not readily available,  . use an alcohol-based hand sanitizer with at least 60% alcohol.  . If coughing or sneezing, cover your mouth and nose by coughing or sneezing into the elbow areas of your shirt or coat, .  into a tissue or into your sleeve (not your hands). . Avoid shaking hands with others and consider head nods or verbal greetings only. . Avoid touching your eyes, nose, or mouth with unwashed hands.  . Avoid close contact with people who are sick. . Avoid places or events with large numbers of people in one location, like concerts or sporting events. . Carefully consider travel plans you have or are making. . If you are planning any travel outside or inside the [place], visit the CDC's Travelers' Health webpage for the latest health notices. . If you have some symptoms but not all symptoms, continue to monitor at home and seek medical attention  . if your symptoms worsen. . If you are having a medical emergency, call 911.   ++++++++++++++++++++++++++++++++   Recommend Adult Low Dose Aspirin or  coated  Aspirin 81 mg daily  To reduce risk of Colon Cancer 20 %,  Skin Cancer 26 % ,  Melanoma 46%  and  Pancreatic cancer 60% ++++++++++++++++++++++++++++++++ Vitamin D goal  is between 70-100.  Please make sure that you are taking your Vitamin D as directed.  It is very important as a natural anti-inflammatory  helping hair, skin, and nails, as well as reducing stroke and heart attack risk.  It helps your bones and helps with mood. It also decreases numerous cancer risks so please take it as directed.  Low Vit D is associated with a 200-300% higher  risk for CANCER  and 200-300% higher risk for HEART   ATTACK  &  STROKE.   ...................................... It is also associated with higher death rate at younger ages,  autoimmune diseases like Rheumatoid arthritis, Lupus, Multiple Sclerosis.    Also many other serious conditions, like depression, Alzheimer's Dementia, infertility, muscle aches, fatigue, fibromyalgia - just to name a few. ++++++++++++++++++++ Recommend the book "The END of DIETING" by Dr Joel Fuhrman  & the book "The END of DIABETES " by Dr Joel Fuhrman At [employer] - get book & Audio CD's    Being diabetic has a  300% increased risk for heart attack, stroke, cancer, and alzheimer- type vascular dementia. It is very important that you work harder with diet by avoiding all foods that are white. Avoid white rice (brown & wild rice is OK), white potatoes (sweetpotatoes in moderation is OK), White bread or wheat bread or anything made out of white flour like bagels, donuts, rolls, buns, biscuits, cakes, pastries, cookies, pizza crust, and pasta (made from white flour & egg whites) - vegetarian pasta or spinach or wheat pasta is OK. Multigrain breads like Arnold's or Pepperidge [employer], or multigrain sandwich thins or flatbreads.  Diet, exercise and weight loss can reverse and cure diabetes in the early stages.  Diet, exercise and weight loss is very important in the control and prevention of complications of diabetes which affects every system in your body, ie. Brain - dementia/stroke, eyes - glaucoma/blindness, heart - heart attack/heart failure, kidneys - dialysis, stomach - gastric paralysis, intestines - malabsorption, nerves - severe painful neuritis, circulation - gangrene & loss of a leg(s), and finally cancer and Alzheimers.    I recommend avoid fried & greasy foods,  sweets/candy, white rice (brown or wild rice or Quinoa is OK), white potatoes (sweet potatoes are OK) - anything made from white flour - bagels, doughnuts,  rolls, buns, biscuits,white and wheat breads, pizza crust and traditional pasta made of white flour & egg white(vegetarian pasta or spinach or wheat pasta is OK).  Multi-grain bread is OK - like multi-grain flat bread or sandwich thins. Avoid alcohol in excess. Exercise is also important.    Eat all the vegetables you want - avoid meat, especially red meat and dairy - especially cheese.  Cheese is the most concentrated form of trans-fats which is the worst thing to clog up our arteries. Veggie cheese is OK which can be found in the fresh produce section at [employer] or [employer] or [employer]  +++++++++++++++++++++ DASH Eating Plan  DASH stands for "Dietary Approaches to Stop Hypertension."   The DASH eating plan is a healthy eating plan that has been shown to reduce high blood pressure (hypertension). Additional health benefits may include reducing the risk of type 2 diabetes mellitus, heart disease, and stroke. The DASH eating plan may   also help with weight loss. WHAT DO I NEED TO KNOW ABOUT THE DASH EATING PLAN? For the DASH eating plan, you will follow these general guidelines:  Choose foods with a percent daily value for sodium of less than 5% (as listed on the food label).  Use salt-free seasonings or herbs instead of table salt or sea salt.  Check with your health care provider or pharmacist before using salt substitutes.  Eat lower-sodium products, often labeled as "lower sodium" or "no salt added."  Eat fresh foods.  Eat more vegetables, fruits, and low-fat dairy products.  Choose whole grains. Look for the word "whole" as the first word in the ingredient list.  Choose fish   Limit sweets, desserts, sugars, and sugary drinks.  Choose heart-healthy fats.  Eat veggie cheese   Eat more home-cooked food and less restaurant, buffet, and fast food.  Limit fried foods.  Cook foods using methods other than frying.  Limit canned vegetables. If you do use them, rinse them  well to decrease the sodium.  When eating at a restaurant, ask that your food be prepared with less salt, or no salt if possible.                      WHAT FOODS CAN I EAT? Read Dr Joel Fuhrman's books on The End of Dieting & The End of Diabetes  Grains Whole grain or whole wheat bread. Brown rice. Whole grain or whole wheat pasta. Quinoa, bulgur, and whole grain cereals. Low-sodium cereals. Corn or whole wheat flour tortillas. Whole grain cornbread. Whole grain crackers. Low-sodium crackers.  Vegetables Fresh or frozen vegetables (raw, steamed, roasted, or grilled). Low-sodium or reduced-sodium tomato and vegetable juices. Low-sodium or reduced-sodium tomato sauce and paste. Low-sodium or reduced-sodium canned vegetables.   Fruits All fresh, canned (in natural juice), or frozen fruits.  Protein Products  All fish and seafood.  Dried beans, peas, or lentils. Unsalted nuts and seeds. Unsalted canned beans.  Dairy Low-fat dairy products, such as skim or 1% milk, 2% or reduced-fat cheeses, low-fat ricotta or cottage cheese, or plain low-fat yogurt. Low-sodium or reduced-sodium cheeses.  Fats and Oils Tub margarines without trans fats. Light or reduced-fat mayonnaise and salad dressings (reduced sodium). Avocado. Safflower, olive, or canola oils. Natural peanut or almond butter.  Other Unsalted popcorn and pretzels. The items listed above may not be a complete list of recommended foods or beverages. Contact your dietitian for more options.  +++++++++++++++  WHAT FOODS ARE NOT RECOMMENDED? Grains/ White flour or wheat flour White bread. White pasta. White rice. Refined cornbread. Bagels and croissants. Crackers that contain trans fat.  Vegetables  Creamed or fried vegetables. Vegetables in a . Regular canned vegetables. Regular canned tomato sauce and paste. Regular tomato and vegetable juices.  Fruits Dried fruits. Canned fruit in light or heavy syrup. Fruit juice.  Meat and  Other Protein Products Meat in general - RED meat & White meat.  Fatty cuts of meat. Ribs, chicken wings, all processed meats as bacon, sausage, bologna, salami, fatback, hot dogs, bratwurst and packaged luncheon meats.  Dairy Whole or 2% milk, cream, half-and-half, and cream cheese. Whole-fat or sweetened yogurt. Full-fat cheeses or blue cheese. Non-dairy creamers and whipped toppings. Processed cheese, cheese spreads, or cheese curds.  Condiments Onion and garlic salt, seasoned salt, table salt, and sea salt. Canned and packaged gravies. Worcestershire sauce. Tartar sauce. Barbecue sauce. Teriyaki sauce. Soy sauce, including reduced sodium. Steak sauce. Fish sauce. Oyster sauce.   Cocktail sauce. Horseradish. Ketchup and mustard. Meat flavorings and tenderizers. Bouillon cubes. Hot sauce. Tabasco sauce. Marinades. Taco seasonings. Relishes.  Fats and Oils Butter, stick margarine, lard, shortening and bacon fat. Coconut, palm kernel, or palm oils. Regular salad dressings.  Pickles and olives. Salted popcorn and pretzels.  The items listed above may not be a complete list of foods and beverages to avoid.   

## 2018-08-16 ENCOUNTER — Telehealth: Payer: Self-pay

## 2018-08-16 ENCOUNTER — Telehealth: Payer: Self-pay | Admitting: *Deleted

## 2018-08-16 NOTE — Telephone Encounter (Signed)
-----   Message from Bea Laura, RN sent at 08/16/2018  9:20 AM EDT ----- Regarding: FW: telehealth visit Delorise Jackson,  I sent this to [place] earlier this week but haven't heard back.  I wasn't sure if she is working or not.  Would you be able to take care of this for me? Lanelle Bal ----- Message ----- From: Bea Laura, RN Sent: 08/14/2018   3:01 PM EDT To: Debbe Mounts, CMA Subject: telehealth visit                               Hi [place],  This patient was scheduled ot have an EGD on 4/30.  Dr. Rush Landmark wants to cancel this procedure and schedule a Telehealth visit for later this month.  Could you do that for me?   Thank you! Lanelle Bal

## 2018-08-16 NOTE — Telephone Encounter (Signed)
The pt was advised and will call back to set up telephone visit.

## 2018-08-16 NOTE — Telephone Encounter (Signed)
Procedure cancelled and pt called but no answer and no voice mail to set up appt.

## 2018-08-16 NOTE — Telephone Encounter (Signed)
Patient's spouse called to verify Prednisone 10 mg directions.  Per Dr Melford Aase, the patient is to take 2 tablets daily. Spouse is aware.

## 2018-08-17 LAB — CBC WITH DIFFERENTIAL/PLATELET
Absolute Monocytes: 200 cells/uL (ref 200–950)
Basophils Absolute: 61 cells/uL (ref 0–200)
Basophils Relative: 0.7 %
Eosinophils Absolute: 78 cells/uL (ref 15–500)
Eosinophils Relative: 0.9 %
HCT: 38 % — ABNORMAL LOW (ref 38.5–50.0)
Hemoglobin: 12.4 g/dL — ABNORMAL LOW (ref 13.2–17.1)
Lymphs Abs: 1131 cells/uL (ref 850–3900)
MCH: 30.9 pg (ref 27.0–33.0)
MCHC: 32.6 g/dL (ref 32.0–36.0)
MCV: 94.8 fL (ref 80.0–100.0)
MPV: 9.9 fL (ref 7.5–12.5)
Monocytes Relative: 2.3 %
Neutro Abs: 7230 cells/uL (ref 1500–7800)
Neutrophils Relative %: 83.1 %
Platelets: 237 10*3/uL (ref 140–400)
RBC: 4.01 10*6/uL — ABNORMAL LOW (ref 4.20–5.80)
RDW: 14.4 % (ref 11.0–15.0)
Total Lymphocyte: 13 %
WBC: 8.7 10*3/uL (ref 3.8–10.8)

## 2018-08-17 LAB — HEMOGLOBIN A1C
Hgb A1c MFr Bld: 5.7 % of total Hgb — ABNORMAL HIGH (ref <5.7)
Mean Plasma Glucose: 117 (calc)
eAG (mmol/L): 6.5 (calc)

## 2018-08-17 LAB — COMPLETE METABOLIC PANEL WITH GFR
AG Ratio: 1.7 (calc) (ref 1.0–2.5)
ALT: 8 U/L — ABNORMAL LOW (ref 9–46)
AST: 19 U/L (ref 10–35)
Albumin: 4.2 g/dL (ref 3.6–5.1)
Alkaline phosphatase (APISO): 66 U/L (ref 35–144)
BUN/Creatinine Ratio: 13 (calc) (ref 6–22)
BUN: 19 mg/dL (ref 7–25)
CO2: 34 mmol/L — ABNORMAL HIGH (ref 20–32)
Calcium: 9.6 mg/dL (ref 8.6–10.3)
Chloride: 99 mmol/L (ref 98–110)
Creat: 1.43 mg/dL — ABNORMAL HIGH (ref 0.70–1.11)
GFR, Est African American: 52 mL/min/{1.73_m2} — ABNORMAL LOW (ref >=60)
GFR, Est Non African American: 45 mL/min/{1.73_m2} — ABNORMAL LOW (ref >=60)
Globulin: 2.5 g/dL (calc) (ref 1.9–3.7)
Glucose, Bld: 135 mg/dL — ABNORMAL HIGH (ref 65–99)
Potassium: 3.5 mmol/L (ref 3.5–5.3)
Sodium: 142 mmol/L (ref 135–146)
Total Bilirubin: 0.4 mg/dL (ref 0.2–1.2)
Total Protein: 6.7 g/dL (ref 6.1–8.1)

## 2018-08-17 LAB — LIPID PANEL
Cholesterol: 184 mg/dL (ref <200)
HDL: 36 mg/dL — ABNORMAL LOW (ref >=40)
LDL Cholesterol (Calc): 120 mg/dL (calc) — ABNORMAL HIGH
Non-HDL Cholesterol (Calc): 148 mg/dL (calc) — ABNORMAL HIGH (ref <130)
Total CHOL/HDL Ratio: 5.1 (calc) — ABNORMAL HIGH (ref <5.0)
Triglycerides: 164 mg/dL — ABNORMAL HIGH (ref <150)

## 2018-08-17 LAB — CORTISOL: Cortisol, Plasma: 9.2 ug/dL

## 2018-08-17 LAB — TSH: TSH: 2.76 mIU/L (ref 0.40–4.50)

## 2018-08-17 LAB — VITAMIN D 25 HYDROXY (VIT D DEFICIENCY, FRACTURES): Vit D, 25-Hydroxy: 90 ng/mL (ref 30–100)

## 2018-08-17 LAB — TEST AUTHORIZATION

## 2018-08-17 LAB — INSULIN, RANDOM: Insulin: 7.5 u[IU]/mL

## 2018-08-17 LAB — MAGNESIUM: Magnesium: 2.4 mg/dL (ref 1.5–2.5)

## 2018-08-20 ENCOUNTER — Telehealth (INDEPENDENT_AMBULATORY_CARE_PROVIDER_SITE_OTHER): Payer: Self-pay | Admitting: Physical Medicine and Rehabilitation

## 2018-08-20 ENCOUNTER — Other Ambulatory Visit: Payer: Self-pay | Admitting: Physician Assistant

## 2018-08-20 NOTE — Telephone Encounter (Signed)
Need to see Dr. Louanne Skye

## 2018-08-20 NOTE — Telephone Encounter (Signed)
Please see messages below. Do you want me to call the patient to schedule with Dr. Louanne Skye?

## 2018-08-21 NOTE — Telephone Encounter (Signed)
Scheduled for 4/24 at 1015.

## 2018-08-21 NOTE — Telephone Encounter (Signed)
Yes please, if you don't mind.

## 2018-08-23 ENCOUNTER — Ambulatory Visit: Payer: Medicare Other | Admitting: Internal Medicine

## 2018-08-23 ENCOUNTER — Other Ambulatory Visit: Payer: Self-pay

## 2018-08-23 VITALS — BP 182/88 | HR 64 | Temp 97.2°F | Resp 16 | Ht 72.0 in | Wt 182.5 lb

## 2018-08-23 DIAGNOSIS — L570 Actinic keratosis: Secondary | ICD-10-CM

## 2018-08-23 MED ORDER — FLUOROURACIL 5 % EX SOLN: CUTANEOUS | 2 refills | Status: DC

## 2018-08-23 NOTE — Progress Notes (Signed)
Subjective:    Patient ID: [name], male    DOB: 29-Aug-1933, 85 y.o.   MRN: [id number]  HPI Patient was scheduled for removal of suspect skin cancer L sideburn   Medication Sig  . acetaminophen (TYLENOL) 500 MG tablet Take 1,000 mg by mouth 2 (two) times daily as needed for headache (pain).   . Ascorbic Acid (VITAMIN C) 500 MG CAPS Take 1 capsule by mouth daily.  Marland Kitchen aspirin EC 81 MG tablet Take 1 tablet (81 mg total) by mouth daily.  . butalbital-acetaminophen-caffeine (FIORICET, ESGIC) 50-325-40 MG tablet Take 1 tablet every 4 hours ONLY if needed for severe Headache (Patient taking differently: Take 1 tablet by mouth daily as needed (severe headache). )  . Calcium Carb-Cholecalciferol (CALCIUM+D3 PO) Take 1 tablet by mouth daily.  . Cholecalciferol (VITAMIN D-3) 5000 units TABS Take 5,000 Units by mouth daily.  . Ferrous Sulfate (IRON SLOW RELEASE) 140 (45 Fe) MG TBCR Take 1 tablet by mouth daily.  . fludrocortisone (FLORINEF) 0.1 MG tablet Take 0.1 mg by mouth 2 (two) times daily.   Marland Kitchen gabapentin (NEURONTIN) 100 MG capsule TAKE 2 CAPSULE(200 MG) BY MOUTH 2 TIMES A DAY. (Patient taking differently: Take 200 mg by mouth 2 (two) times daily. )  . glucose blood test strip Check blood sugar 1 time daily-[id number]  . Magnesium 500 MG TABS Take 1 tablet by mouth daily.  Marland Kitchen MAGNESIUM PO Take 1,000 mg by mouth daily.  . Multiple Vitamin (MULTIVITAMIN WITH MINERALS) TABS tablet Take 1 tablet by mouth daily.  Marland Kitchen olmesartan (BENICAR) 40 MG tablet Take 40 mg by mouth daily. Take 1/2 to 1 tablet daily for blood pressure.  . Omega-3 Fatty Acids (FISH OIL OMEGA-3 PO) Take 1 capsule by mouth daily.  . pantoprazole (PROTONIX) 40 MG tablet TAKE 1 TABLET BY MOUTH TWICE A DAY FOR 1 MONTH, THEN TAKE 1 TABLET DAILY THEREAFTER  . polyethylene glycol (MIRALAX / GLYCOLAX) packet Take 17 g by mouth daily. (Patient taking differently: Take 17 g by mouth 2 (two) times daily. )  . POTASSIUM CHLORIDE PO Take by  mouth daily.  . predniSONE (DELTASONE) 10 MG tablet Take 1 tablet 3 x /day or as directed  . vitamin B-12 (CYANOCOBALAMIN) 1000 MCG tablet Take 1,000 mcg by mouth daily.  . traMADol (ULTRAM) 50 MG tablet Take 1 tablet (50 mg total) by mouth every 6 (six) hours as needed for up to 7 days.   No facility-administered medications prior to visit.    Allergies  Allergen Reactions  . Cymbalta [Duloxetine Hcl] Other (See Comments)    Dizziness  . Keflex [Cephalexin] Other (See Comments)    dizziness  . Simvastatin Other (See Comments)    Joint pain  . Sudafed [Pseudoephedrine] Other (See Comments)    Dizziness   Past Medical History:  Diagnosis Date  . 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 Colon polyps   . Coronary atherosclerosis of unspecified type of vessel, native or graft   . Diabetes ([place])   . Difficult intubation   . Esophageal reflux   . High cholesterol   . History of IBS 02/27/2009  . Hypertension    pt denies, he says he has a h/o hypotension. If BP up he adjusts the Florinef  . Orthostatic hypotension    "BP has been dropping alot when I stand up for the last month or so" (02/17/2016)  . Vitamin B 12 deficiency   . Vitamin D deficiency  Review of Systems   10 point systems review negative except as above.    Objective:   Physical Exam  BP (!) 182/88 Comment: SIT/18288 AND STAND/164/82  Pulse 64   Temp (!) 97.2 F (36.2 C)   Resp 16   Ht 6' (1.829 m)   Wt 182 lb 8 oz (82.8 kg)   BMI 24.75 kg/m   Re- exam of lesion of Lt sideburn appears pink, flat & scaly w/o ulceration. Appears more consistent with an actinic keratosis,  So........................... surgery deferred     Assessment & Plan:   1. Actinic keratosis of left cheek  - Fluorouracil 5 % SOLN; Apply to skin cancer 2 to 3 x /day for 2 to 4 weeks  til raw  Dispense: 10 mL; Refill: 2  - discussed anticipated ulceration / Desquamation with patient & wife .

## 2018-08-24 ENCOUNTER — Encounter (INDEPENDENT_AMBULATORY_CARE_PROVIDER_SITE_OTHER): Payer: Self-pay | Admitting: Specialist

## 2018-08-24 ENCOUNTER — Ambulatory Visit (INDEPENDENT_AMBULATORY_CARE_PROVIDER_SITE_OTHER): Payer: Medicare Other | Admitting: Specialist

## 2018-08-24 VITALS — BP 195/98 | HR 96 | Ht 72.0 in | Wt 180.0 lb

## 2018-08-24 DIAGNOSIS — M419 Scoliosis, unspecified: Secondary | ICD-10-CM | POA: Diagnosis not present

## 2018-08-24 DIAGNOSIS — M48062 Spinal stenosis, lumbar region with neurogenic claudication: Secondary | ICD-10-CM

## 2018-08-24 DIAGNOSIS — M1711 Unilateral primary osteoarthritis, right knee: Secondary | ICD-10-CM | POA: Diagnosis not present

## 2018-08-24 NOTE — Progress Notes (Signed)
Office Visit Note   Patient: [name]           Date of Birth: Mar 23, 1934           MRN: [id number] Visit Date: 08/24/2018              Requested by: [name], [address] [place], [place] [id number] PCP: [name], MD   Assessment & Plan: Visit Diagnoses:  1. Spinal stenosis of lumbar region with neurogenic claudication   2. Scoliosis of thoracolumbar spine, unspecified scoliosis type     Plan: Avoid bending, stooping and avoid lifting weights greater than 10 lbs. Avoid prolong standing and walking. Avoid frequent bending and stooping  No lifting greater than 10 lbs. May use ice or moist heat for pain. Weight loss is of benefit. Handicap license is approved. Dr. Romona Curls secretary/Assistant will call to arrange for epidural steroid injection  Due to history of Upper GI bleed and coronary artery disease you should not take arthritis meds like motrin and naprosyn. Hemp CBD oil capsules may  Be of benefit, I recommend 5,000 mg to 7,000 mg take one capsule twice a day. May take arthritis strength tylenol up to 3-4 times per day.   Follow-Up Instructions: No follow-ups on file.   Orders:  No orders of the defined types were placed in this encounter.  No orders of the defined types were placed in this encounter.     Procedures: No procedures performed   Clinical Data: No additional findings.   Subjective: Chief Complaint  Patient presents with  . Lower Back - Follow-up    85 year old male with AM dizziness and no SOB, no LOC. He feels off balance many times all day. He has had to use a walker for almost 3 weeks. Bilateral knee pain and hip pain. He is sleeping better. Takes an ice pack to bed to use on his back. No bowel or bladder difficulty. He takes miralax days and it helps. He is currently on prednisone 10 mg BID. He underwent ESI by Dr. Ernestina Patches x 2 left L3-4 interlaminar and right transforamenal L5-S1.    Review of  Systems  Constitutional: Negative.  Negative for activity change, appetite change, chills, diaphoresis, fatigue, fever and unexpected weight change.  HENT: Negative.  Negative for congestion, dental problem, drooling, ear discharge, ear pain, facial swelling, hearing loss, mouth sores, nosebleeds, postnasal drip, rhinorrhea, sinus pressure, sinus pain, sneezing, sore throat, tinnitus, trouble swallowing and voice change.   Eyes: Negative.  Negative for photophobia, pain, discharge, redness, itching and visual disturbance.  Respiratory: Negative.  Negative for apnea, cough, choking, chest tightness, shortness of breath and stridor.   Cardiovascular: Negative.  Negative for chest pain, palpitations and leg swelling.  Gastrointestinal: Negative.  Negative for abdominal distention, abdominal pain, anal bleeding, blood in stool, constipation, diarrhea, nausea, rectal pain and vomiting.  Endocrine: Negative.  Negative for cold intolerance, heat intolerance, polydipsia, polyphagia and polyuria.  Genitourinary: Negative for decreased urine volume, difficulty urinating, discharge, dysuria, enuresis, flank pain, frequency, genital sores, hematuria, penile pain, penile swelling, scrotal swelling, testicular pain and urgency.  Musculoskeletal: Positive for back pain. Negative for arthralgias, gait problem, joint swelling, neck pain and neck stiffness.  Skin: Negative.   Allergic/Immunologic: Negative.  Negative for environmental allergies, food allergies and immunocompromised state.  Neurological: Positive for dizziness, weakness, light-headedness and numbness. Negative for tremors, seizures, syncope, facial asymmetry, speech difficulty and headaches.  Hematological: Negative.  Negative for adenopathy. Does  not bruise/bleed easily.  Psychiatric/Behavioral: Negative.  Negative for agitation, behavioral problems, confusion, decreased concentration, dysphoric mood, hallucinations, self-injury, sleep disturbance and  suicidal ideas. The patient is not nervous/anxious and is not hyperactive.      Objective: Vital Signs: BP (!) 195/98 (BP Location: Left Arm, Patient Position: Sitting)   Pulse 96   Ht 6' (1.829 m)   Wt 180 lb (81.6 kg)   BMI 24.41 kg/m   Physical Exam Constitutional:      Appearance: He is well-developed.  HENT:     Head: Normocephalic and atraumatic.  Eyes:     Pupils: Pupils are equal, round, and reactive to light.  Neck:     Musculoskeletal: Normal range of motion and neck supple.  Pulmonary:     Effort: Pulmonary effort is normal.     Breath sounds: Normal breath sounds.  Abdominal:     General: Bowel sounds are normal.     Palpations: Abdomen is soft.  Musculoskeletal:     Right knee: He exhibits no effusion.  Skin:    General: Skin is warm and dry.  Neurological:     Mental Status: He is alert and oriented to person, place, and time.  Psychiatric:        Behavior: Behavior normal.        Thought Content: Thought content normal.        Judgment: Judgment normal.     Right Knee Exam   Tenderness  The patient is experiencing tenderness in the lateral joint line and patellar tendon.  Range of Motion  Extension: normal  Flexion: normal   Tests  McMurray:  Medial - negative Lateral - negative Varus: negative Valgus: negative Lachman:  Anterior - negative    Posterior - negative Drawer:  Anterior - negative    Posterior - negative Pivot shift: negative Patellar apprehension: negative  Other  Erythema: absent Scars: absent Sensation: normal Effusion: no effusion present   Left Knee Exam   Muscle Strength  The patient has normal left knee strength.   Back Exam   Tenderness  The patient is experiencing tenderness in the lumbar.  Range of Motion  Extension: abnormal  Flexion: abnormal  Lateral bend right: abnormal  Lateral bend left: abnormal  Rotation right: abnormal  Rotation left: abnormal   Muscle Strength  Right Quadriceps:  5/5   Left Quadriceps:  5/5  Right Hamstrings:  5/5  Left Hamstrings:  5/5   Tests  Straight leg raise right: negative Straight leg raise left: negative  Reflexes  Patellar: 1/4 Achilles: 1/4 Babinski's sign: normal   Other  Toe walk: normal Heel walk: normal Sensation: normal Gait: normal  Erythema: no back redness Scars: absent      Specialty Comments:  No specialty comments available.  Imaging: No results found.   PMFS History: Patient Active Problem List   Diagnosis Date Noted  . 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 Hyperlipidemia, mixed 08/15/2018  . Low back pain 07/18/2018  . Acute blood loss anemia 06/24/2018  . Upper GI bleed 06/24/2018  . History of lacunar cerebrovascular accident (CVA) 02/12/2018  . Dizziness 02/12/2018  . Myofascial pain 12/18/2017  . Lumbar radiculopathy, right 12/18/2017  . Cholelithiasis 05/17/2017  . Sinus Bradycardia 05/17/2017  . Thoracic radiculopathy 04/12/2017  . Primary osteoarthritis of right knee 12/14/2016  . DDD (degenerative disc disease), lumbar 10/10/2016  . Lumbar facet arthropathy 08/29/2016  . Idiopathic scoliosis 03/22/2016  . Diabetic neuropathy ([place]) 03/22/2016  . Dysautonomia orthostatic hypotension syndrome (  [facility]) 02/25/2016  . CKD stage 3 due to type 2 diabetes mellitus ([facility]) 02/19/2016  . Coronary artery disease involving coronary bypass graft of native heart without angina pectoris   . Diabetes mellitus type 2, diet-controlled ([facility])   . 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 Medication management 08/11/2015  . 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 PVD (peripheral vascular disease) ([facility]) 01/01/2014  . Hyperlipidemia associated with type 2 diabetes mellitus ([facility]) 04/11/2013  . Supine hypertension   . Vitamin D deficiency   . Vitamin B 12 deficiency   . Coronary atherosclerosis- s/p PCI to LAD in 2009 and PCI to RCA in 2011 02/27/2009  . Abdominal aortic aneurysm ([facility]) 02/27/2009  . GERD 02/27/2009   Past Medical  History:  Diagnosis Date  . 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 Colon polyps   . Coronary atherosclerosis of unspecified type of vessel, native or graft   . Diabetes ([place] [facility])   . Difficult intubation   . Esophageal reflux   . High cholesterol   . History of IBS 02/27/2009  . Hypertension    pt denies, he says he has a h/o hypotension. If BP up he adjusts the [place]  . Orthostatic hypotension    "BP has been dropping alot when I stand up for the last month or so" (02/17/2016)  . Vitamin B 12 deficiency   . Vitamin D deficiency     Family History  Problem Relation Age of Onset  . Heart attack Father        died age 62  . Heart attack Brother        died age 43  . Anuerysm Brother        aortic  . Heart attack Sister        died age 23  . Colon cancer Sister   . Liver cancer Sister   . Diabetes Maternal Grandmother   . Colon polyps Sister        and brothers x 2     Past Surgical History:  Procedure Laterality Date  . BIOPSY  06/25/2018   Procedure: BIOPSY;  Surgeon: Rush Landmark Telford Nab., MD;  Location: [facility];  Service: Gastroenterology;;  . BLADDER SURGERY  1969   traumatic pelvic fractures, urethral and bladder repair  . CARDIAC CATHETERIZATION N/A 07/01/2015   Procedure: Left Heart Cath and Coronary Angiography;  Surgeon: Wellington Hampshire, MD;  Location: [facility];  Service: Cardiovascular;  Laterality: N/A;  . COLON RESECTION N/A 05/17/2017   Procedure: DIAGNOSTIC LAPAROSCOPY,;  Surgeon: Leighton Ruff, MD;  Location: [facility];  Service: General;  Laterality: N/A;  . CORONARY ANGIOPLASTY WITH STENT PLACEMENT    . CORONARY ARTERY BYPASS GRAFT N/A 07/06/2015   Procedure: CORONARY ARTERY BYPASS GRAFTING (CABG)x 4   utilizing the left internal mammary artery and endoscopically harvested bilateral  sapheneous vein.;  Surgeon: Ivin Poot, MD;  Location: [facility];  Service: Open Heart Surgery;  Laterality: N/A;  . ESOPHAGOGASTRODUODENOSCOPY (EGD) WITH PROPOFOL N/A  06/25/2018   Procedure: ESOPHAGOGASTRODUODENOSCOPY (EGD) WITH PROPOFOL;  Surgeon: Rush Landmark Telford Nab., MD;  Location: [facility];  Service: Gastroenterology;  Laterality: N/A;  . KNEE SURGERY    . LOOP RECORDER INSERTION N/A 02/19/2018   Procedure: LOOP RECORDER INSERTION;  Surgeon: Deboraha Sprang, MD;  Location: [facility];  Service: Cardiovascular;  Laterality: N/A;  . TEE WITHOUT CARDIOVERSION N/A 07/06/2015   Procedure: TRANSESOPHAGEAL ECHOCARDIOGRAM (TEE);  Surgeon: Ivin Poot, MD;  Location: [facility];  Service: Open Heart Surgery;  Laterality: N/A;   Social History   Occupational History  . Occupation: Retired  Tobacco Use  . Smoking status: Former Smoker    Last attempt to quit: 07/16/1963    Years since quitting: 55.1  . Smokeless tobacco: Former Systems developer    Types: Chew    Quit date: 1989  Substance and Sexual Activity  . Alcohol use: No  . Drug use: No  . Sexual activity: Not Currently

## 2018-08-24 NOTE — Patient Instructions (Addendum)
Plan: Avoid bending, stooping and avoid lifting weights greater than 10 lbs. Avoid prolong standing and walking. Avoid frequent bending and stooping  No lifting greater than 10 lbs. May use ice or moist heat for pain. Weight loss is of benefit. Handicap license is approved. Dr. Romona Curls secretary/Assistant will call to arrange for epidural steroid injection  Due to history of Upper GI bleed and coronary artery disease you should not take arthritis meds like motrin and naprosyn. Hemp CBD oil capsules may be of benefit, I recommend 5,000 mg to 7,000 mg take one capsule twice a day. May take arthritis strength tylenol up to 3-4 times per day.

## 2018-08-26 ENCOUNTER — Encounter: Payer: Self-pay | Admitting: Internal Medicine

## 2018-08-28 ENCOUNTER — Telehealth: Payer: Self-pay

## 2018-08-28 NOTE — Telephone Encounter (Signed)
PA done on cover my meds for Repatha syringe. Pending until I get a decision.

## 2018-08-28 NOTE — Telephone Encounter (Signed)
Repathat INJ 140mg /ml approve from 05/02/2019. Contact number [phone].

## 2018-08-30 ENCOUNTER — Other Ambulatory Visit: Payer: Self-pay

## 2018-08-30 ENCOUNTER — Encounter: Payer: Self-pay | Admitting: Gastroenterology

## 2018-08-30 ENCOUNTER — Encounter: Payer: Medicare Other | Admitting: Gastroenterology

## 2018-08-30 ENCOUNTER — Ambulatory Visit (INDEPENDENT_AMBULATORY_CARE_PROVIDER_SITE_OTHER): Payer: Medicare Other | Admitting: Gastroenterology

## 2018-08-30 DIAGNOSIS — Z86010 Personal history of colonic polyps: Secondary | ICD-10-CM | POA: Diagnosis not present

## 2018-08-30 DIAGNOSIS — K2981 Duodenitis with bleeding: Secondary | ICD-10-CM | POA: Diagnosis not present

## 2018-08-30 DIAGNOSIS — Z800 Family history of malignant neoplasm of digestive organs: Secondary | ICD-10-CM

## 2018-08-30 DIAGNOSIS — D509 Iron deficiency anemia, unspecified: Secondary | ICD-10-CM

## 2018-08-30 DIAGNOSIS — K269 Duodenal ulcer, unspecified as acute or chronic, without hemorrhage or perforation: Secondary | ICD-10-CM

## 2018-08-30 NOTE — Patient Instructions (Signed)
It has been recommended to you by your physician that you have a(n) Endo/Colon [facility]  completed.  We did not schedule the procedure(s) today due to Covid-19. Please contact our office at [phone] should you decide to have the procedure completed.  We will place you on a waitlist and contact you once we are able to schedule procedures.  Continue Protonix twice daily.  Miralax once daily.  Start Fiber once daily.   Your provider has requested that you go to the basement level for lab work in 2 weeks. Press "B" on the elevator. The lab is located at the first door on the left as you exit the elevator.   Thank you for choosing me and [facility].  Dr. Rush Landmark

## 2018-08-30 NOTE — Progress Notes (Signed)
[place] VISIT   Primary Care Provider [name], MD [address] [place] [address] [phone]  Patient Profile: [name] is a 85 y.o. male with a pmh significant for CAD (no longer on Plavix), diabetes, GERD, hyperlipidemia, hypertension, previous B12 deficiency, vitamin D deficiency, peptic ulcer disease, colon polyps, family history of early colon cancer.  The patient presents to the [facility] for an evaluation and management of problem(s) noted below:  Problem List 1. Multiple duodenal ulcers   2. Iron deficiency anemia, unspecified iron deficiency anemia type   3. History of colonic polyps   4. Family history of colon cancer     History of Present Illness Please see prior GI consultation note by Dr. Henrene Pastor as well as recent inpatient consultation notes and progress outpatient note per [facility] for full details of patient's HPI.    I connected with  [name]. I verified that I was speaking with the correct person using two identifiers. Due to the COVID-19 Pandemic, this service was provided via telemedicine using audio only. Interactive audio and video telecommunications were attempted between this provider and patient, however failed, as the patient did not have access to video capability and thus to provide timely and excellent care, we continued and completed visit with audio only. The patient was located at home. The provider was located in the office. The patient did consent to this visit and is aware of charges through their insurance as well as the limitations of evaluation and management by telemedicine. Other persons participating in this telemedicine service were patient's wife.   Interval History Overall patient has been doing well during the COVID-19 pandemic.  He has dark stools which he now understands as a result of his iron intake.  He is using MiraLAX 2 times a  day and having bowel movements 1-3 times per day and is wondering if this can be adjusted.  Otherwise his energy is doing well.  He saw his cardiology team and primary care provider post hospitalization and they have decided to keep him just on aspirin and hold Plavix reinitiation.  He is maintaining his PPI dosing at twice daily.  Not having any abdominal pain.  No hematochezia.  He is not having any nausea or vomiting.  GI Review of Systems Positive as above Negative for dysphagia, odynophagia, early satiety, bloating, hematochezia  Review of Systems General: Denies fevers/chills HEENT: Denies oral lesions Cardiovascular: Denies chest pain/palpitations Pulmonary: Denies shortness of breath Gastroenterological: See HPI Genitourinary: Denies darkened urine Hematological: Denies easy bruising since he has been off the Plavix Dermatological: Denies jaundice Psychological: Mood is stable   Medications Current Outpatient Medications  Medication Sig Dispense Refill  . acetaminophen (TYLENOL) 500 MG tablet Take 1,000 mg by mouth 2 (two) times daily as needed for headache (pain).     . Ascorbic Acid (VITAMIN C) 500 MG CAPS Take 1 capsule by mouth daily.    Marland Kitchen aspirin EC 81 MG tablet Take 1 tablet (81 mg total) by mouth daily.    . butalbital-acetaminophen-caffeine (FIORICET, ESGIC) 50-325-40 MG tablet Take 1 tablet every 4 hours ONLY if needed for severe Headache (Patient taking differently: Take 1 tablet by mouth daily as needed (severe headache). ) 30 tablet 0  . Calcium Carb-Cholecalciferol (CALCIUM+D3 PO) Take 1 tablet by mouth daily.    . Cholecalciferol (VITAMIN D-3) 5000 units TABS Take 5,000 Units by mouth daily.    . Ferrous Sulfate (IRON SLOW RELEASE)  140 (45 Fe) MG TBCR Take 1 tablet by mouth daily.    . fludrocortisone (FLORINEF) 0.1 MG tablet Take 0.1 mg by mouth 2 (two) times daily.     . Fluorouracil 5 % SOLN Apply to skin cancer 2 to 3 x /day for 2 to 4 weeks  til raw 10 mL 2  .  gabapentin (NEURONTIN) 100 MG capsule TAKE 2 CAPSULE(200 MG) BY MOUTH 2 TIMES A DAY. (Patient taking differently: Take 200 mg by mouth 2 (two) times daily. ) 360 capsule 3  . glucose blood test strip Check blood sugar 1 time daily-DX-E11.9 100 each 5  . Magnesium 500 MG TABS Take 1 tablet by mouth daily.    Marland Kitchen MAGNESIUM PO Take 1,000 mg by mouth daily.    . Multiple Vitamin (MULTIVITAMIN WITH MINERALS) TABS tablet Take 1 tablet by mouth daily.    Marland Kitchen olmesartan (BENICAR) 40 MG tablet Take 40 mg by mouth daily. Take 1/2 to 1 tablet daily for blood pressure.    . Omega-3 Fatty Acids (FISH OIL OMEGA-3 PO) Take 1 capsule by mouth daily.    . pantoprazole (PROTONIX) 40 MG tablet TAKE 1 TABLET BY MOUTH TWICE A DAY FOR 1 MONTH, THEN TAKE 1 TABLET DAILY THEREAFTER 60 tablet 0  . polyethylene glycol (MIRALAX / GLYCOLAX) packet Take 17 g by mouth daily. (Patient taking differently: Take 17 g by mouth 2 (two) times daily. ) 28 each 1  . POTASSIUM CHLORIDE PO Take by mouth daily.    . predniSONE (DELTASONE) 10 MG tablet Take 1 tablet 3 x /day or as directed 100 tablet 0  . traMADol (ULTRAM) 50 MG tablet Take 1 tablet (50 mg total) by mouth every 6 (six) hours as needed for up to 7 days. 30 tablet 0  . vitamin B-12 (CYANOCOBALAMIN) 1000 MCG tablet Take 1,000 mcg by mouth daily.     No current facility-administered medications for this visit.     Allergies Allergies  Allergen Reactions  . Cymbalta [Duloxetine Hcl] Other (See Comments)    Dizziness  . Keflex [Cephalexin] Other (See Comments)    dizziness  . Simvastatin Other (See Comments)    Joint pain  . Sudafed [Pseudoephedrine] Other (See Comments)    Dizziness    Histories Past Medical History:  Diagnosis Date  . 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 Colon polyps   . Coronary atherosclerosis of unspecified type of vessel, native or graft   . Diabetes ([place])   . Difficult intubation   . Esophageal reflux   . High cholesterol   . History of IBS  02/27/2009  . Hypertension    pt denies, he says he has a h/o hypotension. If BP up he adjusts the Florinef  . Orthostatic hypotension    "BP has been dropping alot when I stand up for the last month or so" (02/17/2016)  . Vitamin B 12 deficiency   . Vitamin D deficiency    Past Surgical History:  Procedure Laterality Date  . BIOPSY  06/25/2018   Procedure: BIOPSY;  Surgeon: Rush Landmark Telford Nab., MD;  Location: [facility];  Service: Gastroenterology;;  . BLADDER SURGERY  1969   traumatic pelvic fractures, urethral and bladder repair  . CARDIAC CATHETERIZATION N/A 07/01/2015   Procedure: Left Heart Cath and Coronary Angiography;  Surgeon: Wellington Hampshire, MD;  Location: [facility];  Service: Cardiovascular;  Laterality: N/A;  . COLON RESECTION N/A 05/17/2017   Procedure: DIAGNOSTIC LAPAROSCOPY,;  Surgeon: Marcello Moores,  Elmo Putt, MD;  Location: [facility];  Service: General;  Laterality: N/A;  . CORONARY ANGIOPLASTY WITH STENT PLACEMENT    . CORONARY ARTERY BYPASS GRAFT N/A 07/06/2015   Procedure: CORONARY ARTERY BYPASS GRAFTING (CABG)x 4   utilizing the left internal mammary artery and endoscopically harvested bilateral  sapheneous vein.;  Surgeon: Ivin Poot, MD;  Location: [facility];  Service: Open Heart Surgery;  Laterality: N/A;  . ESOPHAGOGASTRODUODENOSCOPY (EGD) WITH PROPOFOL N/A 06/25/2018   Procedure: ESOPHAGOGASTRODUODENOSCOPY (EGD) WITH PROPOFOL;  Surgeon: Rush Landmark Telford Nab., MD;  Location: [facility];  Service: Gastroenterology;  Laterality: N/A;  . KNEE SURGERY    . LOOP RECORDER INSERTION N/A 02/19/2018   Procedure: LOOP RECORDER INSERTION;  Surgeon: Deboraha Sprang, MD;  Location: [place] [facility];  Service: Cardiovascular;  Laterality: N/A;  . TEE WITHOUT CARDIOVERSION N/A 07/06/2015   Procedure: TRANSESOPHAGEAL ECHOCARDIOGRAM (TEE);  Surgeon: Ivin Poot, MD;  Location: [facility];  Service: Open Heart Surgery;  Laterality: N/A;   Social History   Socioeconomic History   . Marital status: Married    Spouse name: Not on file  . Number of children: 2  . Years of education: Not on file  . Highest education level: High school graduate  Occupational History  . Occupation: Retired  Scientific laboratory technician  . Financial resource strain: Not on file  . Food insecurity:    Worry: Not on file    Inability: Not on file  . Transportation needs:    Medical: Not on file    Non-medical: Not on file  Tobacco Use  . Smoking status: Former Smoker    Last attempt to quit: 07/16/1963    Years since quitting: 55.1  . Smokeless tobacco: Former Systems developer    Types: Chew    Quit date: 1989  Substance and Sexual Activity  . Alcohol use: No  . Drug use: No  . Sexual activity: Not Currently  Lifestyle  . Physical activity:    Days per week: Not on file    Minutes per session: Not on file  . Stress: Not on file  Relationships  . Social connections:    Talks on phone: Not on file    Gets together: Not on file    Attends religious service: Not on file    Active member of club or organization: Not on file    Attends meetings of clubs or organizations: Not on file    Relationship status: Not on file  . Intimate partner violence:    Fear of current or ex partner: Not on file    Emotionally abused: Not on file    Physically abused: Not on file    Forced sexual activity: Not on file  Other Topics Concern  . Not on file  Social History Narrative   Lives at home with his wife [name]   Right handed   No caffeine   Family History  Problem Relation Age of Onset  . Heart attack Father        died age 66  . Heart attack Brother        died age 54  . Anuerysm Brother        aortic  . Heart attack Sister        died age 73  . Colon cancer Sister   . Liver cancer Sister   . Diabetes Maternal Grandmother   . Colon polyps Sister        and brothers x 2   .  Esophageal cancer Neg Hx   . Inflammatory bowel disease Neg Hx   . Pancreatic cancer Neg Hx   . Stomach cancer Neg Hx     I have reviewed his medical, social, and family history in detail and updated the electronic medical record as necessary.    PHYSICAL EXAMINATION  Telehealth visit   REVIEW OF DATA  I reviewed the following data at the time of this encounter:  GI Procedures and Studies  2012 colonoscopy Normal colonoscopy  EGD previously reviewed  Laboratory Studies  Reviewed in EPIC  Imaging Studies  No relevant studies to review   ASSESSMENT  Mr. Cypert is a 85 y.o. male with a pmh significant for CAD (no longer on Plavix), diabetes, GERD, hyperlipidemia, hypertension, previous B12 deficiency, vitamin D deficiency, peptic ulcer disease, colon polyps, family history of early colon cancer.  The patient is seen today for evaluation and management of:  1. Multiple duodenal ulcers   2. Iron deficiency anemia, unspecified iron deficiency anemia type   3. History of colonic polyps   4. Family history of colon cancer    The patient seems to be hemodynamically and clinically doing well.  We need to check his iron indices in a few weeks and if they are looking well then we will be in good position of things.  He will need to have follow-up for significant evaluation of his GI tract due to the significance of his ulceration.  He is no longer on Plavix and thus I suspect his risk of rebleeding will be low.  I did discuss with him that we should continue his iron supplementation and PPI dosing at current dose until we complete evaluation.  Think it would be reasonable to generate colonoscopy as well and make this last 1 early colon cancer as well as colon polyps and personal history.  We will plan to do both his upper evaluation and lower endoscopic evaluation at the same time.  The risks and benefits of endoscopic evaluation were discussed with the patient; these include but are not limited to the risk of perforation, infection, bleeding, missed lesions, lack of diagnosis, severe illness requiring  hospitalization, as well as anesthesia and sedation related illnesses.  The patient is agreeable to proceed.   PLAN  Continue PPI BID and send refills until his EGD is repeated Laboratories as outlined below in approximately 3 to 4 weeks Plan for EGD/Colon at end of May/early June or earlier if issues are found with iron indices or labs   Orders Placed This Encounter  Procedures  . CBC  . IBC + Ferritin  . B12  . Folate    New Prescriptions   No medications on file   Modified Medications   No medications on file    Planned Follow Up No follow-ups on file.   Justice Britain, MD [facility] Office # [phone]

## 2018-09-04 ENCOUNTER — Other Ambulatory Visit: Payer: Self-pay | Admitting: Internal Medicine

## 2018-09-06 ENCOUNTER — Telehealth: Payer: Self-pay | Admitting: *Deleted

## 2018-09-06 NOTE — Telephone Encounter (Signed)
Dola Argyle is Dr Mansouraty's CMA and Claiborne Billings is Dr Ardis Hughs.  :)

## 2018-09-06 NOTE — Telephone Encounter (Signed)
[name] or [name],  Dr. Rush Landmark changed this pt's EGD- he would like him to have a telehealth visit later this month.  Could you set that up and let the patient know the appointment date?  Thanks, [employer]

## 2018-09-06 NOTE — Telephone Encounter (Signed)
[name] I looked at the last office visit on 4/30 and [name] has a message to set the EGD up later this month.

## 2018-09-06 NOTE — Telephone Encounter (Signed)
[name] he had a televisit on 4/30 with Dr Jerilynn Mages.  Does he need another one?

## 2018-09-06 NOTE — Telephone Encounter (Signed)
Patty,  Thanks for letting me know Garnett Farm is Dr. Donneta Romberg CMA- sorry about that!  I will clarify with Dr. Rush Landmark which he wants- pt does have an EGD scheduled, but then Dr. Rush Landmark has a handwritten note on the schedule about changing it to a telehealth visit.  I will double check.  Dr. Rush Landmark,  I am just clarifying- do you want this pt to have a telehealth visit or his EGD rescheduled?  Thanks, [employer]

## 2018-09-07 ENCOUNTER — Ambulatory Visit: Payer: Self-pay | Admitting: Adult Health

## 2018-09-10 NOTE — Telephone Encounter (Signed)
Dr. Rush Landmark,  If pt is to have a procedure in June, he will have to be done at [facility]. When I pulled up his chart, it states that he has an active FYI flag for a difficult airway so that disqualifies him from being done at the [facility].  Cyril Mourning

## 2018-09-10 NOTE — Telephone Encounter (Signed)
Patty,  Could you set up both his labwork and his procedure at [facility]?  Thanks, [employer]

## 2018-09-10 NOTE — Telephone Encounter (Signed)
All of his labs need to be done before his next procedure. I would schedule the patient tentatively for something in June and if we have to reschedule earlier once his labs return that is what we will do. Thanks. GM

## 2018-09-10 NOTE — Telephone Encounter (Signed)
That is fine with me. Thanks. GM

## 2018-09-10 NOTE — Telephone Encounter (Signed)
Pt aware and has an appt with Dr Jerilynn Mages.  Labs in [place]

## 2018-09-12 ENCOUNTER — Ambulatory Visit (INDEPENDENT_AMBULATORY_CARE_PROVIDER_SITE_OTHER): Payer: Medicare Other | Admitting: *Deleted

## 2018-09-12 ENCOUNTER — Other Ambulatory Visit: Payer: Self-pay

## 2018-09-12 DIAGNOSIS — I639 Cerebral infarction, unspecified: Secondary | ICD-10-CM

## 2018-09-12 LAB — CUP PACEART REMOTE DEVICE CHECK
Date Time Interrogation Session: 20200513074251
Implantable Pulse Generator Implant Date: 20191021

## 2018-09-14 ENCOUNTER — Other Ambulatory Visit: Payer: Self-pay | Admitting: Physician Assistant

## 2018-09-21 ENCOUNTER — Other Ambulatory Visit: Payer: Self-pay

## 2018-09-21 ENCOUNTER — Ambulatory Visit: Payer: Medicare Other | Admitting: Specialist

## 2018-09-21 ENCOUNTER — Encounter: Payer: Self-pay | Admitting: Specialist

## 2018-09-21 VITALS — BP 167/103 | HR 91 | Ht 72.0 in | Wt 180.0 lb

## 2018-09-21 DIAGNOSIS — M48062 Spinal stenosis, lumbar region with neurogenic claudication: Secondary | ICD-10-CM

## 2018-09-21 DIAGNOSIS — M4802 Spinal stenosis, cervical region: Secondary | ICD-10-CM

## 2018-09-21 DIAGNOSIS — M5416 Radiculopathy, lumbar region: Secondary | ICD-10-CM

## 2018-09-21 NOTE — Patient Instructions (Addendum)
Avoid bending, stooping and avoid lifting weights greater than 10 lbs. Avoid prolong standing and walking. Avoid frequent bending and stooping  No lifting greater than 10 lbs. May use ice or moist heat for pain. Weight loss is of benefit. Handicap license is approved. Dr. Romona Curls secretary/Assistant will call to arrange for epidural steroid injection  Avoid overhead lifting and overhead use of the arms. Do not lift greater than 5 lbs. Adjust head rest in vehicle to prevent hyperextension if rear ended. Take extra precautions to avoid falling.  Marland Kitchen

## 2018-09-21 NOTE — Progress Notes (Signed)
Office Visit Note   Patient: [name]           Date of Birth: 02/16/1934           MRN: [id number] Visit Date: 09/21/2018              Requested by: [name], [address], [place] [address] PCP: [name], MD   Assessment & Plan: Visit Diagnoses:  1. Spinal stenosis of lumbar region with neurogenic claudication   2. Radiculopathy, lumbar region     Plan: Avoid bending, stooping and avoid lifting weights greater than 10 lbs. Avoid prolong standing and walking. Avoid frequent bending and stooping  No lifting greater than 10 lbs. May use ice or moist heat for pain. Weight loss is of benefit. Handicap license is approved. Dr. Romona Curls secretary/Assistant will call to arrange for epidural steroid injection  Avoid overhead lifting and overhead use of the arms. Do not lift greater than 5 lbs. Adjust head rest in vehicle to prevent hyperextension if rear ended. Take extra precautions to avoid falling.   Follow-Up Instructions: No follow-ups on file.   Orders:  No orders of the defined types were placed in this encounter.  No orders of the defined types were placed in this encounter.     Procedures: No procedures performed   Clinical Data: Findings:  CLINICAL DATA:  Low back pain radiating into the bilateral hips and knees  EXAM: MRI LUMBAR SPINE WITHOUT CONTRAST  TECHNIQUE: Multiplanar, multisequence MR imaging of the lumbar spine was performed. No intravenous contrast was administered.  COMPARISON:  09/13/2016  FINDINGS: Segmentation:  5 lumbar type vertebrae  Alignment:  Dextroscoliosis.  Straightening of the lumbar spine.  Vertebrae:  No fracture, evidence of discitis, or bone lesion.  Conus medullaris and cauda equina: Conus extends to the L1-2 level. Conus and cauda equina appear normal.  Paraspinal and other soft tissues: Aortic and iliac aneurysmal enlargement. The abdominal aorta  measures up to 3.3 cm where visualized and left common iliac artery measures up to 2.9 cm  Disc levels:  T12- L1: Spondylosis.  No impingement  L1-L2: Spondylosis.  No impingement  L2-L3: Spondylosis with disc narrowing and mild bulging. No impingement  L3-L4: Severe degenerative disc disease with left-sided collapse and asymmetric spurring. There is moderate left foraminal stenosis. Left subarticular recess narrowing without static L4 compression  L4-L5: Asymmetric leftward degenerative disc collapse with bulge and endplate spurring. Bilateral subarticular recess stenosis with apparent flattening of the left L5 nerve root. There is moderate left more than right foraminal narrowing  L5-S1:Spondylosis and disc degeneration with moderate borderline advanced right foraminal stenosis. The canal is patent  IMPRESSION: 1. Advanced degenerative disease with scoliosis. 2. L3-4 moderate left foraminal narrowing. 3. L4-5 left subarticular recess stenosis and L5 impingement. Moderate bilateral foraminal narrowing. 4. L5-S1 moderate right foraminal narrowing. 5. Aortic and iliac aneurysms without apparent change from 2019 abdominal CT. STUDY DATE: 02/23/2018 is is PATIENT NAME: [name] DOB: [birth year] MRN: [id number]  EXAM: MRI of the cervical spine without contrast  ORDERING CLINICIAN: Sarina Ill, MD CLINICAL HISTORY: 85 year old man with axial, neck pain and cervical degeneration COMPARISON FILMS: None  TECHNIQUE: MRI of the cervical spine was obtained utilizing 3 mm sagittal slices from the posterior fossa down to the T3-4 level with T1, T2 and inversion recovery views. In addition 4 mm axial slices from F7-9 down to T1-2 level were included with T2 and gradient echo views. CONTRAST: None IMAGING  SITE: [facility], [place], [place]  FINDINGS: :  On sagittal images, the spine is imaged from above the cervicomedullary junction to  T3-4.  The visible brain appears normal.  The spinal cord is of normal caliber and signal.   The vertebral bodies are normally aligned.   Therefore superior endplate signal changes of the T2 vertebral body possibly due to a small chronic endplate compression fracture.  The discs and interspaces were further evaluated on axial views from C2 to T1 as follows:  C2-C3: The disc and interspace appear normal.  C3-C4: There is mild disc bulging, mild endplate spurring and mild facet hypertrophy causing mild bilateral foraminal narrowing but no definite nerve root compression.  C4-C5: There is disc bulging and mild facet hypertrophy causing mild foraminal narrowing but no nerve root compression.  C5-C6: There is broad disc protrusion, uncovertebral spurring and facet hypertrophy.  This narrows the central canal without causing spinal stenosis.  There is moderate bilateral foraminal narrowing but no nerve root compression.  C6-C7: There is disc bulging and left uncovertebral spurring causing mild to moderate left foraminal narrowing but no nerve root compression.  C7-T1: The disc interspace appear normal.  T1-T2: The disc and interspace appear normal.  T2-T3: The disc and interspace appears normal.     IMPRESSION: This MRI of the cervical spine shows the following: 1.    Degenerative changes as detailed above, most advanced at C5-C6 where there is moderate foraminal narrowing.  There is no nerve root compression or spinal stenosis. 2.    The spinal cord appears normal.   3.    Chronic minimal superior endplate compression fracture. 4.    There are no acute findings.     INTERPRETING PHYSICIAN:  Richard A. Felecia Shelling, MD, PhD, FAAN Certified in  Neuroimaging by [employer]       Subjective: Chief Complaint  Patient presents with  . Lower Back - Follow-up    85 year old male with history of bilateral leg weakness and numbness in the legs bilateral  knees down into the feet. He has severe spinal stenosis and we have been trying to treat conservatively. Unable to walk more than 150' with a walker. Using a walker x 2 months. He has had 5 strokes and he is not a good candidate for surgery. He is thinking that an ESI was scheduled for today but according to the appointment schedule he is 4 weeks since his last visit and is to have next ESI on 10/02/2018. No bowel or no bladder difficulty.    Review of Systems  Constitutional: Negative.   HENT: Negative.   Eyes: Negative.   Respiratory: Negative.   Cardiovascular: Negative.   Gastrointestinal: Negative.   Endocrine: Negative.   Genitourinary: Negative.   Musculoskeletal: Positive for arthralgias, back pain and gait problem. Negative for joint swelling, myalgias, neck pain and neck stiffness.  Skin: Negative.   Allergic/Immunologic: Negative.   Neurological: Positive for weakness and numbness.  Hematological: Negative.   Psychiatric/Behavioral: Negative.  Negative for agitation, behavioral problems, confusion, decreased concentration, dysphoric mood, hallucinations, self-injury, sleep disturbance and suicidal ideas. The patient is not nervous/anxious and is not hyperactive.      Objective: Vital Signs: BP (!) 167/103   Pulse 91   Ht 6' (1.829 m)   Wt 180 lb (81.6 kg)   BMI 24.41 kg/m   Physical Exam Constitutional:      Appearance: He is well-developed.  HENT:     Head:  Normocephalic and atraumatic.  Eyes:     Pupils: Pupils are equal, round, and reactive to light.  Neck:     Musculoskeletal: Normal range of motion and neck supple.  Pulmonary:     Effort: Pulmonary effort is normal.     Breath sounds: Normal breath sounds.  Abdominal:     General: Bowel sounds are normal.     Palpations: Abdomen is soft.  Skin:    General: Skin is warm and dry.  Neurological:     Mental Status: He is alert and oriented to person, place, and time.  Psychiatric:        Behavior: Behavior  normal.        Thought Content: Thought content normal.        Judgment: Judgment normal.     Back Exam   Tenderness  The patient is experiencing tenderness in the lumbar.  Range of Motion  Extension: abnormal  Flexion: normal  Lateral bend right: abnormal  Lateral bend left: abnormal  Rotation right: abnormal  Rotation left: abnormal   Muscle Strength  Right Quadriceps:  5/5  Left Quadriceps:  5/5  Right Hamstrings:  5/5  Left Hamstrings:  5/5   Tests  Straight leg raise right: negative Straight leg raise left: negative  Reflexes  Patellar: 0/4 Achilles: 0/4 Biceps: normal Babinski's sign: normal   Other  Toe walk: normal Heel walk: normal Sensation: decreased Gait: abductor lurch  Erythema: no back redness Scars: absent  Comments:  Bilateral hip flexion weakness 4/5, right knee extension 4/5. Right foot DF 5-/5      Specialty Comments:  No specialty comments available.  Imaging: No results found.   PMFS History: Patient Active Problem List   Diagnosis Date Noted  . 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 Hyperlipidemia, mixed 08/15/2018  . Low back pain 07/18/2018  . Acute blood loss anemia 06/24/2018  . Upper GI bleed 06/24/2018  . History of lacunar cerebrovascular accident (CVA) 02/12/2018  . Dizziness 02/12/2018  . Myofascial pain 12/18/2017  . Lumbar radiculopathy, right 12/18/2017  . Cholelithiasis 05/17/2017  . Sinus Bradycardia 05/17/2017  . Thoracic radiculopathy 04/12/2017  . Primary osteoarthritis of right knee 12/14/2016  . DDD (degenerative disc disease), lumbar 10/10/2016  . Lumbar facet arthropathy 08/29/2016  . Idiopathic scoliosis 03/22/2016  . Diabetic neuropathy ([place]) 03/22/2016  . Dysautonomia orthostatic hypotension syndrome ([place]) 02/25/2016  . CKD stage 3 due to type 2 diabetes mellitus ([place]) 02/19/2016  . Coronary artery disease involving coronary bypass graft of native heart without angina pectoris   . Diabetes  mellitus type 2, diet-controlled ([place])   . 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 Medication management 08/11/2015  . 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 PVD (peripheral vascular disease) ([place]) 01/01/2014  . Hyperlipidemia associated with type 2 diabetes mellitus ([place]) 04/11/2013  . Supine hypertension   . Vitamin D deficiency   . Vitamin B 12 deficiency   . Coronary atherosclerosis- s/p PCI to LAD in 2009 and PCI to RCA in 2011 02/27/2009  . Abdominal aortic aneurysm ([place]) 02/27/2009  . GERD 02/27/2009   Past Medical History:  Diagnosis Date  . 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 Colon polyps   . Coronary atherosclerosis of unspecified type of vessel, native or graft   . Diabetes ([place])   . Difficult intubation   . Esophageal reflux   . High cholesterol   . History of IBS 02/27/2009  . Hypertension    pt denies, he says he  has a h/o hypotension. If BP up he adjusts the Florinef  . Orthostatic hypotension    "BP has been dropping alot when I stand up for the last month or so" (02/17/2016)  . Vitamin B 12 deficiency   . Vitamin D deficiency     Family History  Problem Relation Age of Onset  . Heart attack Father        died age 36  . Heart attack Brother        died age 22  . Anuerysm Brother        aortic  . Heart attack Sister        died age 54  . Colon cancer Sister   . Liver cancer Sister   . Diabetes Maternal Grandmother   . Colon polyps Sister        and brothers x 2   . Esophageal cancer Neg Hx   . Inflammatory bowel disease Neg Hx   . Pancreatic cancer Neg Hx   . Stomach cancer Neg Hx     Past Surgical History:  Procedure Laterality Date  . BIOPSY  06/25/2018   Procedure: BIOPSY;  Surgeon: Rush Landmark Telford Nab., MD;  Location: [facility];  Service: Gastroenterology;;  . BLADDER SURGERY  1969   traumatic pelvic fractures, urethral and bladder repair  . CARDIAC CATHETERIZATION N/A 07/01/2015   Procedure: Left Heart Cath and Coronary Angiography;  Surgeon: Wellington Hampshire, MD;  Location: [facility];  Service: Cardiovascular;  Laterality: N/A;  . COLON RESECTION N/A 05/17/2017   Procedure: DIAGNOSTIC LAPAROSCOPY,;  Surgeon: Leighton Ruff, MD;  Location: [facility];  Service: General;  Laterality: N/A;  . CORONARY ANGIOPLASTY WITH STENT PLACEMENT    . CORONARY ARTERY BYPASS GRAFT N/A 07/06/2015   Procedure: CORONARY ARTERY BYPASS GRAFTING (CABG)x 4   utilizing the left internal mammary artery and endoscopically harvested bilateral  sapheneous vein.;  Surgeon: Ivin Poot, MD;  Location: [facility];  Service: Open Heart Surgery;  Laterality: N/A;  . ESOPHAGOGASTRODUODENOSCOPY (EGD) WITH PROPOFOL N/A 06/25/2018   Procedure: ESOPHAGOGASTRODUODENOSCOPY (EGD) WITH PROPOFOL;  Surgeon: Rush Landmark Telford Nab., MD;  Location: [facility];  Service: Gastroenterology;  Laterality: N/A;  . KNEE SURGERY    . LOOP RECORDER INSERTION N/A 02/19/2018   Procedure: LOOP RECORDER INSERTION;  Surgeon: Deboraha Sprang, MD;  Location: [facility];  Service: Cardiovascular;  Laterality: N/A;  . TEE WITHOUT CARDIOVERSION N/A 07/06/2015   Procedure: TRANSESOPHAGEAL ECHOCARDIOGRAM (TEE);  Surgeon: Ivin Poot, MD;  Location: [facility];  Service: Open Heart Surgery;  Laterality: N/A;   Social History   Occupational History  . Occupation: Retired  Tobacco Use  . Smoking status: Former Smoker    Last attempt to quit: 07/16/1963    Years since quitting: 55.2  . Smokeless tobacco: Former Systems developer    Types: Chew    Quit date: 1989  Substance and Sexual Activity  . Alcohol use: No  . Drug use: No  . Sexual activity: Not Currently

## 2018-09-26 ENCOUNTER — Telehealth: Payer: Self-pay | Admitting: Gastroenterology

## 2018-09-26 ENCOUNTER — Other Ambulatory Visit: Payer: Self-pay

## 2018-09-26 ENCOUNTER — Ambulatory Visit: Payer: Medicare Other | Admitting: Gastroenterology

## 2018-09-26 ENCOUNTER — Encounter: Payer: Self-pay | Admitting: Gastroenterology

## 2018-09-26 VITALS — Ht 72.0 in | Wt 180.0 lb

## 2018-09-26 NOTE — Patient Instructions (Signed)
Patient will be scheduled for EGD/Colon at hospital. Pt is currently on wait list.   Thank you for choosing me and [facility].  Dr. Rush Landmark

## 2018-09-26 NOTE — Progress Notes (Signed)
[facility]   [name], M.D.    Uvaldo Bristle. Silverio Lay, P.A.-C      Liane Comber, [place]                [address] [place], [place] [address] Telephone [phone] Telefax [phone] _________________________________  Subjective:    Patient ID: [name], male    DOB: 18-Sep-1933, 85 y.o.   MRN: [id number]  HPI   This nice 85 yo MWM with multiple co-morbidities including labile Supine HTN, Dysautonomia w/Postural Hypotension , HLD, ASCAD/CABG, returns today fro f/u tx of actinic keratosis/Skin Ca of Lt side burn with Efudex now x 1 month.     Patient had recent hospitalization with GI Bleed in Feb requiring Tx of 2 u pRBC with EGD discovering DU's x 2 and he's scheduled for f/u EGD for healing. He is on Iron & B12 oral supplements.   Medication Sig  . Acetaminophen 500 MG  Take 1,000 mg  2  times daily as needed  . VITAMIN C 500 MG  Take 1 capsule  daily.  Marland Kitchen aspirin EC 81 MG tab Take 1 tablet  daily.  Marland Kitchen FIORICET  tab Take 1 tablet every 4 hours ONLY if needed  . CALCIUM+D3  Take 1 tablet  daily.  Marland Kitchen VITAMIN D 5000 units  Take  daily.  . IRON SLOW  (45 Fe)  Take 1 tablet  daily.  . fludrocortisone  0.1 MG tab TAKE 2 TABLETS  EVERY DAY  . Gabapentin 100 MG cap TAKE 2 CAPSULE  2 TIMES A DAY  . Magnesium 500 MG TAB Take 1 tablet  daily.  . MultiVit w/ MINERALS Take 1 tablet daily.  Marland Kitchen olmesartan  40 MG tab Take 1/2 to 1 tablet daily for blood pressure.   . Omega-3 FISH OIL Take 1 capsule  daily.  . pantoprazole  40 MG tab Take 1 tablet Daily for Acid Indigestion & Reflux  . polyethylene glycol Take 17 g  daily  . POTASSIUM CHLORIDE  Take  daily.  . predniSONE10 MG tab Take 1 tablet 3 x /day or as directed   . vitamin B-12 1000 MCG tab Take  daily.   Allergies  Allergen Reactions  . Cymbalta [Duloxetine Hcl] Other (See Comments)    Dizziness  . Keflex [Cephalexin] Other (See  Comments)    dizziness  . Simvastatin Other (See Comments)    Joint pain  . Sudafed [Pseudoephedrine] Other (See Comments)    Dizziness   Past Medical History:  Diagnosis Date  . 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 Colon polyps   . Coronary atherosclerosis of unspecified type of vessel, native or graft   . Diabetes ([place])   . Difficult intubation   . Esophageal reflux   . High cholesterol   . History of IBS 02/27/2009  . Hypertension    pt denies, he says he has a h/o hypotension. If BP up he adjusts the Florinef  . Orthostatic hypotension    "BP has been dropping alot when I stand up for the last month or so" (02/17/2016)  . Vitamin B 12 deficiency   . Vitamin D deficiency    Past Surgical History:  Procedure Laterality Date  . BIOPSY  06/25/2018   Procedure: BIOPSY;  Surgeon: Rush Landmark Telford Nab., MD;  Location: [facility];  Service: Gastroenterology;;  .  BLADDER SURGERY  1969   traumatic pelvic fractures, urethral and bladder repair  . CARDIAC CATHETERIZATION N/A 07/01/2015   Procedure: Left Heart Cath and Coronary Angiography;  Surgeon: Wellington Hampshire, MD;  Location: [facility];  Service: Cardiovascular;  Laterality: N/A;  . COLON RESECTION N/A 05/17/2017   Procedure: DIAGNOSTIC LAPAROSCOPY,;  Surgeon: Leighton Ruff, MD;  [facility];  Service: General;  Laterality: N/A;  . CORONARY ANGIOPLASTY WITH STENT PLACEMENT    . CORONARY ARTERY BYPASS GRAFT N/A 07/06/2015   Procedure: CORONARY ARTERY BYPASS GRAFTING (CABG)x 4   utilizing the left internal mammary artery and endoscopically harvested bilateral  sapheneous vein.;  Surgeon: Ivin Poot, MD;  Location: [facility];  Service: Open Heart Surgery;  Laterality: N/A;  . ESOPHAGOGASTRODUODENOSCOPY (EGD) WITH PROPOFOL N/A 06/25/2018   Procedure: ESOPHAGOGASTRODUODENOSCOPY (EGD) WITH PROPOFOL;  Surgeon: Rush Landmark Telford Nab., MD;  Location: [facility];  Service: Gastroenterology;  Laterality: N/A;  . KNEE SURGERY     . LOOP RECORDER INSERTION N/A 02/19/2018   Procedure: LOOP RECORDER INSERTION;  Surgeon: Deboraha Sprang, MD;  Location: [facility];  Service: Cardiovascular;  Laterality: N/A;  . TEE WITHOUT CARDIOVERSION N/A 07/06/2015   Procedure: TRANSESOPHAGEAL ECHOCARDIOGRAM (TEE);  Surgeon: Ivin Poot, MD;  Location: [facility];  Service: Open Heart Surgery;  Laterality: N/A;   Review of Systems   10 point systems review negative except as above.    Objective:   Physical Exam  BP (!) 162/84 Comment: 162/84-sit and 152/80-standing  Pulse 92   Temp (!) 97 F (36.1 C)   Resp 16   Ht 6' (1.829 m)   Wt 185 lb 3.2 oz (84 kg)   BMI 25.12 kg/m   HEENT - WNL. Neck - supple.  Chest - Clear equal BS. Cor - Nl HS. RRR w/o sig MGR. PP 1(+). No edema. MS- FROM w/o deformities.  Gait Nl. Neuro -  Nl w/o focal abnormalities. Skin - Crusted dry eschar over Left temple &  sideburn where treated with g Efudex    Assessment & Plan:    1. Supine hypertension  - CBC with Differential/Platelet - COMPLETE METABOLIC PANEL WITH GFR  2. Dysautonomia orthostatic hypotension syndrome (HCC)  - COMPLETE METABOLIC PANEL WITH GFR  3. Vitamin B 12 deficiency - Vitamin B12  4. Iron deficiency anemia due to chronic blood loss  - Ferritin - Iron,Total/Total Iron Binding Cap - Folate RBC  5. Adrenal insufficiency (HCC)  - Cortisol  6. Duodenal ulcer disease  - CBC with Differential/Platelet  7. Medication management  - CBC with Differential/Platelet - COMPLETE METABOLIC PANEL WITH GFR - Cortisol - Ferritin - Iron,Total/Total Iron Binding Cap - Folate RBC - Vitamin B12

## 2018-09-26 NOTE — Telephone Encounter (Signed)
PT wife return your call

## 2018-09-27 ENCOUNTER — Other Ambulatory Visit: Payer: Self-pay

## 2018-09-27 ENCOUNTER — Ambulatory Visit (INDEPENDENT_AMBULATORY_CARE_PROVIDER_SITE_OTHER): Payer: Medicare Other | Admitting: Internal Medicine

## 2018-09-27 VITALS — BP 162/84 | HR 92 | Temp 97.0°F | Resp 16 | Ht 72.0 in | Wt 185.2 lb

## 2018-09-27 DIAGNOSIS — I10 Essential (primary) hypertension: Secondary | ICD-10-CM

## 2018-09-27 DIAGNOSIS — G903 Multi-system degeneration of the autonomic nervous system: Secondary | ICD-10-CM

## 2018-09-27 DIAGNOSIS — E2740 Unspecified adrenocortical insufficiency: Secondary | ICD-10-CM

## 2018-09-27 DIAGNOSIS — D500 Iron deficiency anemia secondary to blood loss (chronic): Secondary | ICD-10-CM | POA: Diagnosis not present

## 2018-09-27 DIAGNOSIS — E538 Deficiency of other specified B group vitamins: Secondary | ICD-10-CM

## 2018-09-27 DIAGNOSIS — Z79899 Other long term (current) drug therapy: Secondary | ICD-10-CM

## 2018-09-27 DIAGNOSIS — K269 Duodenal ulcer, unspecified as acute or chronic, without hemorrhage or perforation: Secondary | ICD-10-CM | POA: Diagnosis not present

## 2018-09-27 DIAGNOSIS — I951 Orthostatic hypotension: Secondary | ICD-10-CM

## 2018-09-28 LAB — CBC WITH DIFFERENTIAL/PLATELET
Absolute Monocytes: 267 cells/uL (ref 200–950)
Basophils Absolute: 9 cells/uL (ref 0–200)
Basophils Relative: 0.1 %
Eosinophils Absolute: 9 cells/uL — ABNORMAL LOW (ref 15–500)
Eosinophils Relative: 0.1 %
HCT: 37.9 % — ABNORMAL LOW (ref 38.5–50.0)
Hemoglobin: 12.7 g/dL — ABNORMAL LOW (ref 13.2–17.1)
Lymphs Abs: 1601 cells/uL (ref 850–3900)
MCH: 31.8 pg (ref 27.0–33.0)
MCHC: 33.5 g/dL (ref 32.0–36.0)
MCV: 94.8 fL (ref 80.0–100.0)
MPV: 10.2 fL (ref 7.5–12.5)
Monocytes Relative: 2.9 %
Neutro Abs: 7314 cells/uL (ref 1500–7800)
Neutrophils Relative %: 79.5 %
Platelets: 225 10*3/uL (ref 140–400)
RBC: 4 10*6/uL — ABNORMAL LOW (ref 4.20–5.80)
RDW: 14.1 % (ref 11.0–15.0)
Total Lymphocyte: 17.4 %
WBC: 9.2 10*3/uL (ref 3.8–10.8)

## 2018-09-28 LAB — COMPLETE METABOLIC PANEL WITH GFR
AG Ratio: 1.6 (calc) (ref 1.0–2.5)
ALT: 10 U/L (ref 9–46)
AST: 15 U/L (ref 10–35)
Albumin: 3.8 g/dL (ref 3.6–5.1)
Alkaline phosphatase (APISO): 69 U/L (ref 35–144)
BUN/Creatinine Ratio: 13 (calc) (ref 6–22)
BUN: 19 mg/dL (ref 7–25)
CO2: 29 mmol/L (ref 20–32)
Calcium: 9.5 mg/dL (ref 8.6–10.3)
Chloride: 103 mmol/L (ref 98–110)
Creat: 1.5 mg/dL — ABNORMAL HIGH (ref 0.70–1.11)
GFR, Est African American: 49 mL/min/{1.73_m2} — ABNORMAL LOW (ref >=60)
GFR, Est Non African American: 42 mL/min/{1.73_m2} — ABNORMAL LOW (ref >=60)
Globulin: 2.4 g/dL (calc) (ref 1.9–3.7)
Glucose, Bld: 213 mg/dL — ABNORMAL HIGH (ref 65–99)
Potassium: 4.9 mmol/L (ref 3.5–5.3)
Sodium: 144 mmol/L (ref 135–146)
Total Bilirubin: 0.3 mg/dL (ref 0.2–1.2)
Total Protein: 6.2 g/dL (ref 6.1–8.1)

## 2018-09-28 LAB — IRON, TOTAL/TOTAL IRON BINDING CAP
%SAT: 26 % (calc) (ref 20–48)
Iron: 86 ug/dL (ref 50–180)
TIBC: 335 mcg/dL (calc) (ref 250–425)

## 2018-09-28 LAB — FERRITIN: Ferritin: 48 ng/mL (ref 24–380)

## 2018-09-28 LAB — CORTISOL: Cortisol, Plasma: 8.7 ug/dL

## 2018-09-28 LAB — VITAMIN B12: Vitamin B-12: 776 pg/mL (ref 200–1100)

## 2018-09-28 LAB — FOLATE RBC: RBC Folate: 1031 ng/mL RBC (ref >280)

## 2018-09-28 NOTE — Telephone Encounter (Signed)
This is a documentation of a brief telephone discussion I had with the patient's wife. She had been unaware that the patient was to have laboratories that we had previously discussed for her husband (our patient [name]). We discussed that we did not need to have a repeat telemedicine visit but rather he needs to come in for laboratories and then we would move forward with scheduling a follow-up EGD and colonoscopy based on the laboratories in the coming weeks. We will have a removal of the clinic visit that was scheduled for 09/26/2018 since this was not a formal telemedicine visit.  She agrees with the plan of action and will have the laboratories performed at Dr. Idell Pickles office or come into our office to have the labs done for patient. She was appreciative for the call back. GM

## 2018-09-28 NOTE — Progress Notes (Signed)
Error Note

## 2018-09-29 ENCOUNTER — Encounter: Payer: Self-pay | Admitting: Internal Medicine

## 2018-09-30 ENCOUNTER — Other Ambulatory Visit: Payer: Medicare Other

## 2018-09-30 ENCOUNTER — Telehealth: Payer: Self-pay | Admitting: Cardiology

## 2018-09-30 DIAGNOSIS — Z20822 Contact with and (suspected) exposure to covid-19: Secondary | ICD-10-CM

## 2018-09-30 NOTE — Telephone Encounter (Signed)
Spoke with patient regarding recent office visit 5/22 and possible Covid exposure. Covid order placed, appt Sunday 5/31 @ [facility]

## 2018-10-01 LAB — NOVEL CORONAVIRUS, NAA: SARS-CoV-2, NAA: NOT DETECTED

## 2018-10-02 ENCOUNTER — Telehealth: Payer: Self-pay | Admitting: Gastroenterology

## 2018-10-02 ENCOUNTER — Other Ambulatory Visit: Payer: Self-pay

## 2018-10-02 ENCOUNTER — Ambulatory Visit: Payer: Medicare Other | Admitting: Physical Medicine and Rehabilitation

## 2018-10-02 ENCOUNTER — Ambulatory Visit: Payer: Self-pay

## 2018-10-02 ENCOUNTER — Encounter: Payer: Self-pay | Admitting: Physical Medicine and Rehabilitation

## 2018-10-02 VITALS — BP 166/105 | HR 79

## 2018-10-02 DIAGNOSIS — M5416 Radiculopathy, lumbar region: Secondary | ICD-10-CM | POA: Diagnosis not present

## 2018-10-02 MED ORDER — BETAMETHASONE SOD PHOS & ACET 6 (3-3) MG/ML IJ SUSP
12.0000 mg | Freq: Once | INTRAMUSCULAR | Status: AC
  Administered 2018-10-02: 12 mg

## 2018-10-02 NOTE — Procedures (Signed)
Lumbosacral Transforaminal Epidural Steroid Injection - Sub-Pedicular Approach with Fluoroscopic Guidance  Patient: [name]      Date of Birth: 11/23/1933 MRN: [id number] PCP: [name], MD      Visit Date: 10/02/2018   Universal Protocol:    Date/Time: 10/02/2018  Consent Given By: the patient  Position: PRONE  Additional Comments: Vital signs were monitored before and after the procedure. Patient was prepped and draped in the usual sterile fashion. The correct patient, procedure, and site was verified.   Injection Procedure Details:  Procedure Site One Meds Administered:  Meds ordered this encounter  Medications  . betamethasone acetate-betamethasone sodium phosphate (CELESTONE) injection 12 mg    Laterality: Right  Location/Site:  L5-S1  Needle size: 22 G  Needle type: Spinal  Needle Placement: Transforaminal  Findings:    -Comments: Excellent flow of contrast along the nerve and into the epidural space.  Procedure Details: After squaring off the end-plates to get a true AP view, the C-arm was positioned so that an oblique view of the foramen as noted above was visualized. The target area is just inferior to the "nose of the scotty dog" or sub pedicular. The soft tissues overlying this structure were infiltrated with 2-3 ml. of 1% Lidocaine without Epinephrine.  The spinal needle was inserted toward the target using a "trajectory" view along the fluoroscope beam.  Under AP and lateral visualization, the needle was advanced so it did not puncture dura and was located close the 6 O'Clock position of the pedical in AP tracterory. Biplanar projections were used to confirm position. Aspiration was confirmed to be negative for CSF and/or blood. A 1-2 ml. volume of Isovue-250 was injected and flow of contrast was noted at each level. Radiographs were obtained for documentation purposes.   After attaining the desired flow of contrast documented above, a 0.5  to 1.0 ml test dose of 0.25% Marcaine was injected into each respective transforaminal space.  The patient was observed for 90 seconds post injection.  After no sensory deficits were reported, and normal lower extremity motor function was noted,   the above injectate was administered so that equal amounts of the injectate were placed at each foramen (level) into the transforaminal epidural space.   Additional Comments:  The patient tolerated the procedure well Dressing: 2 x 2 sterile gauze and Band-Aid    Post-procedure details: Patient was observed during the procedure. Post-procedure instructions were reviewed.  Patient left the clinic in stable condition.  Lumbar Epidural Steroid Injection - Interlaminar Approach with Fluoroscopic Guidance  Patient: [name]      Date of Birth: [birth year] MRN: [id number] PCP: [name], MD      Visit Date: 10/02/2018   Universal Protocol:     Consent Given By: the patient  Position: PRONE  Additional Comments: Vital signs were monitored before and after the procedure. Patient was prepped and draped in the usual sterile fashion. The correct patient, procedure, and site was verified.   Injection Procedure Details:  Procedure Site One Meds Administered:  Meds ordered this encounter  Medications  . betamethasone acetate-betamethasone sodium phosphate (CELESTONE) injection 12 mg     Laterality: Left  Location/Site:  L3-L4  Needle size: 20 G  Needle type: Tuohy  Needle Placement: Paramedian epidural  Findings:   -Comments: Excellent flow of contrast into the epidural space.  Procedure Details: Using a paramedian approach from the side mentioned above, the region overlying the inferior lamina was localized under fluoroscopic visualization and  the soft tissues overlying this structure were infiltrated with 4 ml. of 1% Lidocaine without Epinephrine. The Tuohy needle was inserted into the epidural space using a  paramedian approach.   The epidural space was localized using loss of resistance along with lateral and bi-planar fluoroscopic views.  After negative aspirate for air, blood, and CSF, a 2 ml. volume of Isovue-250 was injected into the epidural space and the flow of contrast was observed. Radiographs were obtained for documentation purposes.    The injectate was administered into the level noted above.   Additional Comments:   Dressing: 2 x 2 sterile gauze and Band-Aid    Post-procedure details: Patient was observed during the procedure. Post-procedure instructions were reviewed.  Patient left the clinic in stable condition.

## 2018-10-02 NOTE — Telephone Encounter (Signed)
Left message on machine to call back  

## 2018-10-02 NOTE — Telephone Encounter (Signed)
The pt has been advised that the labs have not been reviewed and we will call once recommendations are made.

## 2018-10-02 NOTE — Progress Notes (Signed)
[name] - 85 y.o. male MRN [id number]  Date of birth: 20-Jan-1934  Office Visit Note: Visit Date: 10/02/2018 PCP: [name], MD Referred by: [name], MD  Subjective: Chief Complaint  Patient presents with  . Lower Back - Pain  . Right Thigh - Pain  . Left Thigh - Pain   HPI:  [name] is a 85 y.o. male who comes in today At the request of Dr. Basil Dess for left L3-interlaminar epidural steroid injection and right L5 transforaminal epidural steroid injection.  Patient has moderate foraminal narrowing at L5 and left lateral recess narrowing at L3-4 and L4-5.  Please see Dr. Louanne Skye notes for details and justification.  Prior diagnostic injections have not been as beneficial and it appears are still trying to help him in a situation where he has stenosis laterally and foraminally but not centrally and he does have scoliosis and degenerative change.  Patient has chronic pain syndrome with history of anxiety.  ROS Otherwise per HPI.  Assessment & Plan: Visit Diagnoses:  1. Lumbar radiculopathy     Plan: No additional findings.   Meds & Orders:  Meds ordered this encounter  Medications  . betamethasone acetate-betamethasone sodium phosphate (CELESTONE) injection 12 mg    Orders Placed This Encounter  Procedures  . XR C-ARM NO REPORT  . Epidural Steroid injection    Follow-up: Return if symptoms worsen or fail to improve, for Basil Dess, MD.   Procedures: No procedures performed  Lumbosacral Transforaminal Epidural Steroid Injection - Sub-Pedicular Approach with Fluoroscopic Guidance  Patient: [name]      Date of Birth: 07/28/1933 MRN: [id number] PCP: [name], MD      Visit Date: 10/02/2018   Universal Protocol:    Date/Time: 10/02/2018  Consent Given By: the patient  Position: PRONE  Additional Comments: Vital signs were monitored before and after the procedure. Patient was prepped and draped in the usual sterile  fashion. The correct patient, procedure, and site was verified.   Injection Procedure Details:  Procedure Site One Meds Administered:  Meds ordered this encounter  Medications  . betamethasone acetate-betamethasone sodium phosphate (CELESTONE) injection 12 mg    Laterality: Right  Location/Site:  L5-S1  Needle size: 22 G  Needle type: Spinal  Needle Placement: Transforaminal  Findings:    -Comments: Excellent flow of contrast along the nerve and into the epidural space.  Procedure Details: After squaring off the end-plates to get a true AP view, the C-arm was positioned so that an oblique view of the foramen as noted above was visualized. The target area is just inferior to the "nose of the scotty dog" or sub pedicular. The soft tissues overlying this structure were infiltrated with 2-3 ml. of 1% Lidocaine without Epinephrine.  The spinal needle was inserted toward the target using a "trajectory" view along the fluoroscope beam.  Under AP and lateral visualization, the needle was advanced so it did not puncture dura and was located close the 6 O'Clock position of the pedical in AP tracterory. Biplanar projections were used to confirm position. Aspiration was confirmed to be negative for CSF and/or blood. A 1-2 ml. volume of Isovue-250 was injected and flow of contrast was noted at each level. Radiographs were obtained for documentation purposes.   After attaining the desired flow of contrast documented above, a 0.5 to 1.0 ml test dose of 0.25% Marcaine was injected into each respective transforaminal space.  The patient was observed for 90 seconds post  injection.  After no sensory deficits were reported, and normal lower extremity motor function was noted,   the above injectate was administered so that equal amounts of the injectate were placed at each foramen (level) into the transforaminal epidural space.   Additional Comments:  The patient tolerated the procedure well  Dressing: 2 x 2 sterile gauze and Band-Aid    Post-procedure details: Patient was observed during the procedure. Post-procedure instructions were reviewed.  Patient left the clinic in stable condition.  Lumbar Epidural Steroid Injection - Interlaminar Approach with Fluoroscopic Guidance  Patient: [name]      Date of Birth: 08/24/1933 MRN: [id number] PCP: [name], MD      Visit Date: 10/02/2018   Universal Protocol:     Consent Given By: the patient  Position: PRONE  Additional Comments: Vital signs were monitored before and after the procedure. Patient was prepped and draped in the usual sterile fashion. The correct patient, procedure, and site was verified.   Injection Procedure Details:  Procedure Site One Meds Administered:  Meds ordered this encounter  Medications  . betamethasone acetate-betamethasone sodium phosphate (CELESTONE) injection 12 mg     Laterality: Left  Location/Site:  L3-L4  Needle size: 20 G  Needle type: Tuohy  Needle Placement: Paramedian epidural  Findings:   -Comments: Excellent flow of contrast into the epidural space.  Procedure Details: Using a paramedian approach from the side mentioned above, the region overlying the inferior lamina was localized under fluoroscopic visualization and the soft tissues overlying this structure were infiltrated with 4 ml. of 1% Lidocaine without Epinephrine. The Tuohy needle was inserted into the epidural space using a paramedian approach.   The epidural space was localized using loss of resistance along with lateral and bi-planar fluoroscopic views.  After negative aspirate for air, blood, and CSF, a 2 ml. volume of Isovue-250 was injected into the epidural space and the flow of contrast was observed. Radiographs were obtained for documentation purposes.    The injectate was administered into the level noted above.   Additional Comments:   Dressing: 2 x 2 sterile gauze and  Band-Aid    Post-procedure details: Patient was observed during the procedure. Post-procedure instructions were reviewed.  Patient left the clinic in stable condition.    Clinical History: MRI LUMBAR SPINE WITHOUT CONTRAST  TECHNIQUE: Multiplanar, multisequence MR imaging of the lumbar spine was performed. No intravenous contrast was administered.  COMPARISON:  09/13/2016  FINDINGS: Segmentation:  5 lumbar type vertebrae  Alignment:  Dextroscoliosis.  Straightening of the lumbar spine.  Vertebrae:  No fracture, evidence of discitis, or bone lesion.  Conus medullaris and cauda equina: Conus extends to the L1-2 level. Conus and cauda equina appear normal.  Paraspinal and other soft tissues: Aortic and iliac aneurysmal enlargement. The abdominal aorta measures up to 3.3 cm where visualized and left common iliac artery measures up to 2.9 cm  Disc levels:  T12- L1: Spondylosis.  No impingement  L1-L2: Spondylosis.  No impingement  L2-L3: Spondylosis with disc narrowing and mild bulging. No impingement  L3-L4: Severe degenerative disc disease with left-sided collapse and asymmetric spurring. There is moderate left foraminal stenosis. Left subarticular recess narrowing without static L4 compression  L4-L5: Asymmetric leftward degenerative disc collapse with bulge and endplate spurring. Bilateral subarticular recess stenosis with apparent flattening of the left L5 nerve root. There is moderate left more than right foraminal narrowing  L5-S1:Spondylosis and disc degeneration with moderate borderline advanced right foraminal stenosis. The canal is  patent  IMPRESSION: 1. Advanced degenerative disease with scoliosis. 2. L3-4 moderate left foraminal narrowing. 3. L4-5 left subarticular recess stenosis and L5 impingement. Moderate bilateral foraminal narrowing. 4. L5-S1 moderate right foraminal narrowing. 5. Aortic and iliac aneurysms without apparent  change from 2019 abdominal CT.   Electronically Signed   By: Monte Fantasia M.D.   On: 08/09/2018 09:26     Objective:  VS:  HT:    WT:   BMI:     BP:(!) 166/105  HR:79bpm  TEMP: ( )  RESP:  Physical Exam  Ortho Exam Imaging: Xr C-arm No Report  Result Date: 10/02/2018 Please see Notes tab for imaging impression.

## 2018-10-02 NOTE — Progress Notes (Signed)
  Numeric Pain Rating Scale and Functional Assessment Average Pain 5   In the last MONTH (on 0-10 scale) has pain interfered with the following?  1. General activity like being  able to carry out your everyday physical activities such as walking, climbing stairs, carrying groceries, or moving a chair?  Rating(8)   +Driver, -BT, -Dye Allergies.  

## 2018-10-02 NOTE — Progress Notes (Signed)
Carelink Summary Report / Loop Recorder 

## 2018-10-13 ENCOUNTER — Other Ambulatory Visit: Payer: Self-pay | Admitting: Internal Medicine

## 2018-10-13 MED ORDER — GABAPENTIN 100 MG PO CAPS: ORAL_CAPSULE | ORAL | 2 refills | Status: DC

## 2018-10-15 ENCOUNTER — Ambulatory Visit (INDEPENDENT_AMBULATORY_CARE_PROVIDER_SITE_OTHER): Payer: Medicare Other | Admitting: *Deleted

## 2018-10-15 DIAGNOSIS — I639 Cerebral infarction, unspecified: Secondary | ICD-10-CM

## 2018-10-15 LAB — CUP PACEART REMOTE DEVICE CHECK
Date Time Interrogation Session: 20200615094154
Implantable Pulse Generator Implant Date: 20191021

## 2018-10-23 NOTE — Progress Notes (Signed)
Carelink Summary Report / Loop Recorder 

## 2018-10-25 ENCOUNTER — Other Ambulatory Visit: Payer: Self-pay | Admitting: Internal Medicine

## 2018-10-25 MED ORDER — BUTALBITAL-APAP-CAFFEINE 50-325-40 MG PO TABS: ORAL_TABLET | ORAL | 0 refills | Status: DC

## 2018-11-06 ENCOUNTER — Other Ambulatory Visit: Payer: Self-pay | Admitting: Internal Medicine

## 2018-11-06 DIAGNOSIS — E2740 Unspecified adrenocortical insufficiency: Secondary | ICD-10-CM

## 2018-11-13 ENCOUNTER — Telehealth: Payer: Self-pay

## 2018-11-13 ENCOUNTER — Other Ambulatory Visit: Payer: Self-pay

## 2018-11-13 DIAGNOSIS — D509 Iron deficiency anemia, unspecified: Secondary | ICD-10-CM

## 2018-11-13 DIAGNOSIS — K219 Gastro-esophageal reflux disease without esophagitis: Secondary | ICD-10-CM

## 2018-11-13 DIAGNOSIS — Z8719 Personal history of other diseases of the digestive system: Secondary | ICD-10-CM

## 2018-11-13 DIAGNOSIS — K2981 Duodenitis with bleeding: Secondary | ICD-10-CM

## 2018-11-13 DIAGNOSIS — Z86010 Personal history of colonic polyps: Secondary | ICD-10-CM

## 2018-11-13 DIAGNOSIS — K269 Duodenal ulcer, unspecified as acute or chronic, without hemorrhage or perforation: Secondary | ICD-10-CM

## 2018-11-13 MED ORDER — PEG 3350-KCL-NA BICARB-NACL 420 G PO SOLR: 4000.0000 mL | Freq: Once | ORAL | 0 refills | Status: AC

## 2018-11-13 NOTE — Telephone Encounter (Signed)
Pt scheduled for Colon /Endo @ [facility] 12:30.Covid- Testing scheduled for 12/13/2018  [name] has been informed. Instructions have mailed. All questions answered. [name] voiced understanding.

## 2018-11-13 NOTE — Telephone Encounter (Signed)
-----   Message from Irving Copas., MD sent at 09/28/2018  6:01 PM EDT ----- Regarding: Follow up Thanks for the update.He'll be getting on the schedule in the coming weeks for a repeat EGD and Colonoscopy. Artelia Game, let's go ahead and get the patient on the books for the procedures at convenience of the patient and wife.  I believe they are leaving town in the beginning of July. He will need to be in the Hospital due to difficult airway. Gabe ----- Message ----- From: [name], MD Sent: 09/28/2018   2:34 PM EDT To: Melbourne Abts, RN, Irving Copas., MD  =============================================================================================  - labs reviewed with pt's wife - Estill Bamberg =============================================================================================  - Red Cell count is Stable and OK  =============================================================================================  - Iron level & Ferritin tests show Iron levels are OK now   - Folate levels are Normal / OK   - Vit B12 level is Normal / OK  =============================================================================================  - Cortisol (Adrenal gland levels - are OK )  =============================================================================================  - Kidney tests suggest mild Dehydration & important to drink at least 6 bottles (16 oz) of fluids  ~ 100 oz /day  =============================================================================================  - Blood sugar is elevated at 213 mg% - Wife reports had eaten shortly before came to office for labs   - and Fasting glucoses are usually less than 140 mg%. =============================================================================================

## 2018-11-14 NOTE — Progress Notes (Signed)
FOLLOW UP THIS ENCOUNTER IS A VIRTUAL/TELEPHONE VISIT DUE TO COVID-19 - PATIENT WAS NOT SEEN IN THE OFFICE.  PATIENT HAS CONSENTED TO VIRTUAL VISIT / TELEMEDICINE VISIT  This provider placed a call to [employer] using telephone, his appointment was changed to a virtual office visit to reduce the risk of exposure to the COVID-19 virus and to help [employer] remain healthy and safe. The virtual visit will also provide continuity of care. He verbalizes understanding.   Assessment and Plan:   BP Well controlled with current medications; continue verapamil, florinef Monitor blood pressure at home; patient to call if consistently greater than 130/80 Continue DASH diet.   Reminder to go to the ER if any CP, SOB, nausea, dizziness, severe HA, changes vision/speech, left arm numbness and tingling and jaw pain. DO THE PREDNISONE EVERY OTHER DAY AND THEN STOP CHECK BP AT NIGHT WHILE LYING DOWN- LIKELY CAUSE OF HOT FLASHES  Cholesterol Currently above goal; mild elevations not treated secondary to age, continue to monitor Continue low cholesterol diet and exercise.  Check lipid panel.   Diabetes with diabetic chronic kidney disease, with diabetic peripheral angiopathy without gangrene and with diabetic polyneuropathy Managed by lifestyle Continue diet and exercise.  Perform daily foot/skin check, notify office of any concerning changes.  Check A1C  Overweight Long discussion about weight loss, diet, and exercise Recommended diet heavy in fruits and veggies and low in animal meats, cheeses, and dairy products, appropriate calorie intake Discussed ideal weight for height  Will follow up in 3 months  Vitamin D Def At goal at last visit; continue supplementation to maintain goal of 70-100 Defer Vit D level  Continue diet and meds as discussed. Further disposition pending results of labs. Discussed med's effects and SE's.   Over 30 minutes of exam, counseling, chart review, and  critical decision making was performed.   Future Appointments  Date Time Provider [place]  11/16/2018 10:30 AM Vicie Mutters, PA-C [facility] None  11/19/2018  9:35 AM [facility] DEVICE REMOTES [facility]  12/03/2018  2:45 PM Venancio Poisson, NP [facility] None  12/13/2018 10:30 AM [facility] None  01/15/2019  9:40 AM Stanford Breed, Denice Bors, MD [facility]  02/20/2019  2:00 PM [name], MD [facility] None  05/29/2019  3:00 PM Liane Comber, NP [facility] None    ----------------------------------------------------------------------------------------------------------------------  HPI 85 y.o. male  presents for 3 month follow up on hypertension, cholesterol, diabetes, weight and vitamin D deficiency.   He has developed dysautonomia with supine HTN and severe postural Hypotension managed with Florinef which his wife titrates to maintain standing BP to 110-120 and allow for a higher sitting BP of 150-160's. He is continuing to push fluids.  Patient complains of hot flashes while lying down at night, states will happen when he intially lies down, with or without sweating, has happened x months.  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   He states he was put on prednisone 10 mg twice a day to "gain weight" he is only one once a day. He is now walking with a walker due to back pain, he follows with [facility]. He states his memory is not as good as it once was.   BMI is Body mass index is 24.82 kg/m., he has been working on diet, and does stretching and strength exercises daily every morning.  Wt Readings from Last 3 Encounters:  11/16/18 183 lb (83 kg)  09/27/18 185 lb  3.2 oz (84 kg)  09/26/18 180 lb (81.6 kg)   His blood pressure has been controlled at home, today their BP is BP: (!) 165/88  He does workout. He denies chest pain, shortness of breath, dizziness.   He is not on cholesterol medication secondary  to age and muscle aches, he could not tolerate zetia as well in the past. His cholesterol is not at goal. The cholesterol last visit was:   Lab Results  Component Value Date   CHOL 184 08/15/2018   HDL 36 (L) 08/15/2018   LDLCALC 120 (H) 08/15/2018   TRIG 164 (H) 08/15/2018   CHOLHDL 5.1 (H) 08/15/2018    He has been working on diet and exercise for diet controlled T2 diabetes, and denies foot ulcerations, increased appetite, nausea, polydipsia, polyuria, visual disturbances, vomiting and weight loss. He does check a sporadic fasting glucose, reports ranges 100-130. Last A1C in the office was:  Lab Results  Component Value Date   HGBA1C 5.7 (H) 08/15/2018   Patient is on Vitamin D supplement.   Lab Results  Component Value Date   VD25OH 90 08/15/2018        Current Medications:  Current Outpatient Medications on File Prior to Visit  Medication Sig  . acetaminophen (TYLENOL) 500 MG tablet Take 1,000 mg by mouth 2 (two) times daily as needed for headache (pain).   . Ascorbic Acid (VITAMIN C) 500 MG CAPS Take 1 capsule by mouth daily.  Marland Kitchen aspirin EC 81 MG tablet Take 1 tablet (81 mg total) by mouth daily.  . butalbital-acetaminophen-caffeine (FIORICET) 50-325-40 MG tablet Take 1 tablet every 4 hours ONLY if  Severe Headache  . Calcium Carb-Cholecalciferol (CALCIUM+D3 PO) Take 1 tablet by mouth daily.  . Cholecalciferol (VITAMIN D-3) 5000 units TABS Take 5,000 Units by mouth daily.  . Ferrous Sulfate (IRON SLOW RELEASE) 140 (45 Fe) MG TBCR Take 1 tablet by mouth daily.  . fludrocortisone (FLORINEF) 0.1 MG tablet TAKE 2 TABLETS BY MOUTH EVERY DAY (Patient taking differently: 0.05 mg daily. )  . gabapentin (NEURONTIN) 100 MG capsule Take 1 to 2 capsules 2 to 3 x /day for Neuropathy Pain  . glucose blood test strip Check blood sugar 1 time daily-[id number]  . Magnesium 500 MG TABS Take 1 tablet by mouth daily.  . Multiple Vitamin (MULTIVITAMIN WITH MINERALS) TABS tablet Take 1 tablet by  mouth daily.  Marland Kitchen olmesartan (BENICAR) 40 MG tablet Take 40 mg by mouth as needed. Take 1/2 to 1 tablet daily for blood pressure.   . Omega-3 Fatty Acids (FISH OIL OMEGA-3 PO) Take 1 capsule by mouth daily.  . pantoprazole (PROTONIX) 40 MG tablet Take 1 tablet Daily for Acid Indigestion & Reflux  . polyethylene glycol (MIRALAX / GLYCOLAX) packet Take 17 g by mouth daily. (Patient taking differently: Take 17 g by mouth 2 (two) times daily. )  . POTASSIUM CHLORIDE PO Take by mouth daily.  . predniSONE (DELTASONE) 10 MG tablet Take 1 tablet 3 x /day as directed  . vitamin B-12 (CYANOCOBALAMIN) 1000 MCG tablet Take 1,000 mcg by mouth daily.   No current facility-administered medications on file prior to visit.      Allergies:  Allergies  Allergen Reactions  . Cymbalta [Duloxetine Hcl] Other (See Comments)    Dizziness  . Keflex [Cephalexin] Other (See Comments)    dizziness  . Simvastatin Other (See Comments)    Joint pain  . Sudafed [Pseudoephedrine] Other (See Comments)    Dizziness  Medical History:  Past Medical History:  Diagnosis Date  . 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 Colon polyps   . Coronary atherosclerosis of unspecified type of vessel, native or graft   . Diabetes ([place])   . Difficult intubation   . Esophageal reflux   . High cholesterol   . History of IBS 02/27/2009  . Hypertension    pt denies, he says he has a h/o hypotension. If BP up he adjusts the Florinef  . Orthostatic hypotension    "BP has been dropping alot when I stand up for the last month or so" (02/17/2016)  . Vitamin B 12 deficiency   . Vitamin D deficiency    Family history- Reviewed and unchanged Social history- Reviewed and unchanged   Review of Systems:  Review of Systems  Constitutional: Negative for malaise/fatigue and weight loss.  HENT: Negative for hearing loss and tinnitus.   Eyes: Negative for blurred vision and double vision.  Respiratory: Negative for cough, shortness of  breath and wheezing.   Cardiovascular: Negative for chest pain, palpitations, orthopnea, claudication and leg swelling.  Gastrointestinal: Negative for abdominal pain, blood in stool, constipation, diarrhea, heartburn, melena, nausea and vomiting.  Genitourinary: Negative.   Musculoskeletal: Positive for back pain and joint pain. Negative for myalgias.  Skin: Negative for rash.  Neurological: Positive for dizziness (Chronic constant) and tingling. Negative for sensory change, weakness and headaches.  Endo/Heme/Allergies: Negative for polydipsia.  Psychiatric/Behavioral: Negative.   All other systems reviewed and are negative.    Physical Exam: BP (!) 165/88 (Patient Position: Sitting)   Pulse 69   Ht 6' (1.829 m)   Wt 183 lb (83 kg)   BMI 24.82 kg/m  Wt Readings from Last 3 Encounters:  11/16/18 183 lb (83 kg)  09/27/18 185 lb 3.2 oz (84 kg)  09/26/18 180 lb (81.6 kg)  General Appearance:Well sounding, in no apparent distress.  ENT/Mouth: No hoarseness, No cough for duration of visit.  Respiratory: completing full sentences without distress, without audible wheeze Neuro: Awake and oriented X 3,  Psych:  Insight and Judgment appropriate.    Vicie Mutters, PA-C 9:55 AM [facility]

## 2018-11-16 ENCOUNTER — Other Ambulatory Visit: Payer: Self-pay

## 2018-11-16 ENCOUNTER — Ambulatory Visit: Payer: Medicare Other | Admitting: Physician Assistant

## 2018-11-16 VITALS — BP 165/88 | HR 69 | Ht 72.0 in | Wt 183.0 lb

## 2018-11-16 DIAGNOSIS — E119 Type 2 diabetes mellitus without complications: Secondary | ICD-10-CM | POA: Diagnosis not present

## 2018-11-16 DIAGNOSIS — I739 Peripheral vascular disease, unspecified: Secondary | ICD-10-CM

## 2018-11-16 DIAGNOSIS — E1122 Type 2 diabetes mellitus with diabetic chronic kidney disease: Secondary | ICD-10-CM | POA: Diagnosis not present

## 2018-11-16 DIAGNOSIS — E1342 Other specified diabetes mellitus with diabetic polyneuropathy: Secondary | ICD-10-CM

## 2018-11-16 DIAGNOSIS — E1169 Type 2 diabetes mellitus with other specified complication: Secondary | ICD-10-CM | POA: Diagnosis not present

## 2018-11-16 DIAGNOSIS — I714 Abdominal aortic aneurysm, without rupture, unspecified: Secondary | ICD-10-CM

## 2018-11-16 DIAGNOSIS — N183 Chronic kidney disease, stage 3 unspecified: Secondary | ICD-10-CM

## 2018-11-16 DIAGNOSIS — E785 Hyperlipidemia, unspecified: Secondary | ICD-10-CM

## 2018-11-16 DIAGNOSIS — Z789 Other specified health status: Secondary | ICD-10-CM

## 2018-11-16 DIAGNOSIS — I951 Orthostatic hypotension: Secondary | ICD-10-CM

## 2018-11-16 DIAGNOSIS — G903 Multi-system degeneration of the autonomic nervous system: Secondary | ICD-10-CM

## 2018-11-16 DIAGNOSIS — M5416 Radiculopathy, lumbar region: Secondary | ICD-10-CM

## 2018-11-18 LAB — CUP PACEART REMOTE DEVICE CHECK
Date Time Interrogation Session: 20200718093537
Implantable Pulse Generator Implant Date: 20191021

## 2018-11-19 ENCOUNTER — Ambulatory Visit (INDEPENDENT_AMBULATORY_CARE_PROVIDER_SITE_OTHER): Payer: Medicare Other | Admitting: *Deleted

## 2018-11-19 DIAGNOSIS — I639 Cerebral infarction, unspecified: Secondary | ICD-10-CM | POA: Diagnosis not present

## 2018-11-19 MED ORDER — MECLIZINE HCL 12.5 MG PO TABS: 12.5000 mg | ORAL_TABLET | Freq: Three times a day (TID) | ORAL | 0 refills | Status: DC | PRN

## 2018-11-19 NOTE — Addendum Note (Signed)
Addended by: Vicie Mutters R on: 11/19/2018 12:36 PM   Modules accepted: Orders

## 2018-11-27 DIAGNOSIS — M5134 Other intervertebral disc degeneration, thoracic region: Secondary | ICD-10-CM | POA: Diagnosis not present

## 2018-11-27 DIAGNOSIS — M9902 Segmental and somatic dysfunction of thoracic region: Secondary | ICD-10-CM | POA: Diagnosis not present

## 2018-11-29 ENCOUNTER — Ambulatory Visit: Payer: Self-pay

## 2018-11-29 ENCOUNTER — Ambulatory Visit (INDEPENDENT_AMBULATORY_CARE_PROVIDER_SITE_OTHER): Payer: Medicare Other | Admitting: Specialist

## 2018-11-29 ENCOUNTER — Encounter: Payer: Self-pay | Admitting: Specialist

## 2018-11-29 VITALS — BP 161/93 | HR 80 | Ht 72.0 in | Wt 183.0 lb

## 2018-11-29 DIAGNOSIS — M542 Cervicalgia: Secondary | ICD-10-CM

## 2018-11-29 DIAGNOSIS — R0781 Pleurodynia: Secondary | ICD-10-CM | POA: Diagnosis not present

## 2018-11-29 DIAGNOSIS — M5416 Radiculopathy, lumbar region: Secondary | ICD-10-CM | POA: Diagnosis not present

## 2018-11-29 DIAGNOSIS — R634 Abnormal weight loss: Secondary | ICD-10-CM | POA: Diagnosis not present

## 2018-11-29 DIAGNOSIS — M546 Pain in thoracic spine: Secondary | ICD-10-CM

## 2018-11-29 NOTE — Patient Instructions (Signed)
Avoid bending, stooping and avoid lifting weights greater than 10 lbs. Avoid prolong standing and walking. Avoid frequent bending and stooping  No lifting greater than 10 lbs. May use ice or moist heat for pain. Weight loss is of benefit. Handicap license is approved. Bone scan ordered to assess the diffuse areas of pain in the ribs and thorax and lumbar spine and right pelvis. Blood work to assess for multiple myeloma, already had PSA according to patient. On vitamin D. Consider radiofrequency ablation of thoracic and cervical facets if no bone lesions or other cause for diffuse pain.

## 2018-11-29 NOTE — Progress Notes (Signed)
Office Visit Note   Patient: [name]           Date of Birth: 1933-12-06           MRN: [id number] Visit Date: 11/29/2018              Requested by: [name], [address] [place],   [id number] PCP: [name], MD   Assessment & Plan: Visit Diagnoses:  1. Radiculopathy, lumbar region   2. Pain in thoracic spine   3. Cervicalgia   4. Abnormal weight loss   5. Rib pain on left side   6. Rib pain on right side     Plan: Avoid bending, stooping and avoid lifting weights greater than 10 lbs. Avoid prolong standing and walking. Avoid frequent bending and stooping  No lifting greater than 10 lbs. May use ice or moist heat for pain. Weight loss is of benefit. Handicap license is approved. Bone scan ordered to assess the diffuse areas of pain in the ribs and thorax and lumbar spine and right pelvis. Blood work to assess for multiple myeloma, already had PSA according to patient. On vitamin D. Consider radiofrequency ablation of thoracic and cervical facets if no bone lesions or other cause for diffuse pain.   Follow-Up Instructions: Return in about 3 weeks (around 12/20/2018).   Orders:  Orders Placed This Encounter  Procedures  . XR Thoracic Spine 2 View  . XR Lumbar Spine 2-3 Views  . NM Bone Scan Whole Body  . Sed Rate (ESR)  . Protein Electrophoresis, (serum)   No orders of the defined types were placed in this encounter.     Procedures: No procedures performed   Clinical Data: No additional findings.   Subjective: Chief Complaint  Patient presents with  . Lower Back - Pain  . Middle Back - Pain    85 year old male with 2 week history of pain in the left chest and ribs and left posterior ribs at about mid thoracic area on the left and also with pain on the right anterior ribs. He has a nephew with pain and had a RFA with help. With breakfast he has tremor right hand holding coffee. Trouble getting out a car  has to hold his legs to get out of the car.   Review of Systems  Constitutional: Positive for activity change, appetite change and unexpected weight change. Negative for chills, diaphoresis, fatigue and fever.  HENT: Positive for sinus pressure and sinus pain. Negative for congestion, dental problem, drooling, ear discharge, ear pain, facial swelling, hearing loss, mouth sores, nosebleeds, postnasal drip, rhinorrhea, sneezing, sore throat, tinnitus, trouble swallowing and voice change.   Eyes: Positive for visual disturbance. Negative for photophobia, pain, discharge, redness and itching.  Respiratory: Positive for chest tightness and wheezing. Negative for apnea, cough, choking, shortness of breath and stridor.   Cardiovascular: Positive for chest pain. Negative for palpitations and leg swelling.  Gastrointestinal: Negative for abdominal distention, abdominal pain, anal bleeding, blood in stool, constipation, diarrhea, nausea, rectal pain and vomiting.  Endocrine: Negative for cold intolerance, heat intolerance, polydipsia, polyphagia and polyuria.  Genitourinary: Negative for difficulty urinating, dysuria, enuresis, flank pain, frequency, genital sores, hematuria and urgency.  Musculoskeletal: Positive for back pain, gait problem and neck pain. Negative for arthralgias, joint swelling, myalgias and neck stiffness.  Skin: Negative.  Negative for color change, pallor, rash and wound.  Allergic/Immunologic: Negative for environmental allergies and food allergies.  Neurological: Positive for  weakness, light-headedness and numbness. Negative for dizziness, tremors, seizures, syncope, facial asymmetry, speech difficulty and headaches.  Hematological: Negative.  Negative for adenopathy. Does not bruise/bleed easily.  Psychiatric/Behavioral: Negative for agitation, behavioral problems, confusion, decreased concentration, dysphoric mood, hallucinations, self-injury, sleep disturbance and suicidal ideas.  The patient is not nervous/anxious and is not hyperactive.      Objective: Vital Signs: BP (!) 161/93 (BP Location: Left Arm, Patient Position: Sitting)   Pulse 80   Ht 6' (1.829 m)   Wt 83 kg   BMI 24.82 kg/m   Physical Exam  Ortho Exam  Specialty Comments:  No specialty comments available.  Imaging: No results found.   PMFS History: Patient Active Problem List   Diagnosis Date Noted  . Statin intolerance 11/16/2018  . 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 Hyperlipidemia, mixed 08/15/2018  . Low back pain 07/18/2018  . Acute blood loss anemia 06/24/2018  . Upper GI bleed 06/24/2018  . History of lacunar cerebrovascular accident (CVA) 02/12/2018  . Dizziness 02/12/2018  . Myofascial pain 12/18/2017  . Lumbar radiculopathy, right 12/18/2017  . Cholelithiasis 05/17/2017  . Sinus Bradycardia 05/17/2017  . Thoracic radiculopathy 04/12/2017  . Primary osteoarthritis of right knee 12/14/2016  . DDD (degenerative disc disease), lumbar 10/10/2016  . Lumbar facet arthropathy 08/29/2016  . Idiopathic scoliosis 03/22/2016  . Diabetic neuropathy ([place]) 03/22/2016  . Dysautonomia orthostatic hypotension syndrome ([place]) 02/25/2016  . CKD stage 3 due to type 2 diabetes mellitus ([place]) 02/19/2016  . Coronary artery disease involving coronary bypass graft of native heart without angina pectoris   . Diabetes mellitus type 2, diet-controlled ([place])   . 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 Medication management 08/11/2015  . 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 PVD (peripheral vascular disease) ([place]) 01/01/2014  . Hyperlipidemia associated with type 2 diabetes mellitus ([place]) 04/11/2013  . Supine hypertension   . Vitamin D deficiency   . Vitamin B 12 deficiency   . Coronary atherosclerosis- s/p PCI to LAD in 2009 and PCI to RCA in 2011 02/27/2009  . Abdominal aortic aneurysm ([place]) 02/27/2009  . GERD 02/27/2009   Past Medical History:  Diagnosis Date  . 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  Colon polyps   . Coronary atherosclerosis of unspecified type of vessel, native or graft   . Diabetes ([place])   . Difficult intubation   . Esophageal reflux   . High cholesterol   . History of IBS 02/27/2009  . Hypertension    pt denies, he says he has a h/o hypotension. If BP up he adjusts the [place]  . Orthostatic hypotension    "BP has been dropping alot when I stand up for the last month or so" (02/17/2016)  . Vitamin B 12 deficiency   . Vitamin D deficiency     Family History  Problem Relation Age of Onset  . Heart attack Father        died age 41  . Heart attack Brother        died age 38  . Anuerysm Brother        aortic  . Heart attack Sister        died age 57  . Colon cancer Sister   . Liver cancer Sister   . Diabetes Maternal Grandmother   . Colon polyps Sister        and brothers x 2   . Esophageal cancer Neg Hx   . Inflammatory bowel disease Neg Hx   .  Pancreatic cancer Neg Hx   . Stomach cancer Neg Hx     Past Surgical History:  Procedure Laterality Date  . BIOPSY  06/25/2018   Procedure: BIOPSY;  Surgeon: Rush Landmark Telford Nab., MD;  Location: [facility];  Service: Gastroenterology;;  . BLADDER SURGERY  1969   traumatic pelvic fractures, urethral and bladder repair  . CARDIAC CATHETERIZATION N/A 07/01/2015   Procedure: Left Heart Cath and Coronary Angiography;  Surgeon: Wellington Hampshire, MD;  Location: [facility];  Service: Cardiovascular;  Laterality: N/A;  . COLON RESECTION N/A 05/17/2017   Procedure: DIAGNOSTIC LAPAROSCOPY,;  Surgeon: Leighton Ruff, MD;  Location: [facility];  Service: General;  Laterality: N/A;  . CORONARY ANGIOPLASTY WITH STENT PLACEMENT    . CORONARY ARTERY BYPASS GRAFT N/A 07/06/2015   Procedure: CORONARY ARTERY BYPASS GRAFTING (CABG)x 4   utilizing the left internal mammary artery and endoscopically harvested bilateral  sapheneous vein.;  Surgeon: Ivin Poot, MD;  Location: [facility];  Service: Open Heart Surgery;  Laterality:  N/A;  . ESOPHAGOGASTRODUODENOSCOPY (EGD) WITH PROPOFOL N/A 06/25/2018   Procedure: ESOPHAGOGASTRODUODENOSCOPY (EGD) WITH PROPOFOL;  Surgeon: Rush Landmark Telford Nab., MD;  Location: [facility];  Service: Gastroenterology;  Laterality: N/A;  . KNEE SURGERY    . LOOP RECORDER INSERTION N/A 02/19/2018   Procedure: LOOP RECORDER INSERTION;  Surgeon: Deboraha Sprang, MD;  Location: [facility];  Service: Cardiovascular;  Laterality: N/A;  . TEE WITHOUT CARDIOVERSION N/A 07/06/2015   Procedure: TRANSESOPHAGEAL ECHOCARDIOGRAM (TEE);  Surgeon: Ivin Poot, MD;  Location: [facility];  Service: Open Heart Surgery;  Laterality: N/A;   Social History   Occupational History  . Occupation: Retired  Tobacco Use  . Smoking status: Former Smoker    Quit date: 07/16/1963    Years since quitting: 55.4  . Smokeless tobacco: Former Systems developer    Types: Chew    Quit date: 1989  Substance and Sexual Activity  . Alcohol use: No  . Drug use: No  . Sexual activity: Not Currently

## 2018-12-03 ENCOUNTER — Ambulatory Visit (INDEPENDENT_AMBULATORY_CARE_PROVIDER_SITE_OTHER): Payer: Medicare Other | Admitting: Adult Health

## 2018-12-03 ENCOUNTER — Encounter: Payer: Self-pay | Admitting: Adult Health

## 2018-12-03 ENCOUNTER — Other Ambulatory Visit: Payer: Self-pay

## 2018-12-03 VITALS — BP 177/99 | HR 74 | Temp 98.3°F | Ht 72.0 in | Wt 192.4 lb

## 2018-12-03 DIAGNOSIS — I10 Essential (primary) hypertension: Secondary | ICD-10-CM | POA: Diagnosis not present

## 2018-12-03 DIAGNOSIS — E785 Hyperlipidemia, unspecified: Secondary | ICD-10-CM

## 2018-12-03 DIAGNOSIS — E119 Type 2 diabetes mellitus without complications: Secondary | ICD-10-CM | POA: Diagnosis not present

## 2018-12-03 DIAGNOSIS — Z8673 Personal history of transient ischemic attack (TIA), and cerebral infarction without residual deficits: Secondary | ICD-10-CM

## 2018-12-03 LAB — PROTEIN ELECTROPHORESIS, SERUM
Albumin ELP: 3.9 g/dL (ref 3.8–4.8)
Alpha 1: 0.4 g/dL — ABNORMAL HIGH (ref 0.2–0.3)
Alpha 2: 0.9 g/dL (ref 0.5–0.9)
Beta 2: 0.4 g/dL (ref 0.2–0.5)
Beta Globulin: 0.5 g/dL (ref 0.4–0.6)
Gamma Globulin: 0.6 g/dL — ABNORMAL LOW (ref 0.8–1.7)
Total Protein: 6.5 g/dL (ref 6.1–8.1)

## 2018-12-03 LAB — SEDIMENTATION RATE: Sed Rate: 2 mm/h (ref 0–20)

## 2018-12-03 NOTE — Patient Instructions (Signed)
Continue aspirin 81 mg daily  and omega-3 fish oil for secondary stroke prevention  Continue to follow up with PCP regarding cholesterol and blood pressure management   Continue to follow with cardiology as scheduled -discussion regarding potential referral to lipid clinic  Continue to monitor blood pressure at home  Maintain strict control of hypertension with blood pressure goal below 130/90, diabetes with hemoglobin A1c goal below 6.5% and cholesterol with LDL cholesterol (bad cholesterol) goal below 70 mg/dL. I also advised the patient to eat a healthy diet with plenty of whole grains, cereals, fruits and vegetables, exercise regularly and maintain ideal body weight.  He has been stable overall from a stroke standpoint and at this time, recommend follow-up as needed but to call office with any questions or concerns regarding stroke       Thank you for coming to see [place] at [facility]. I hope we have been able to provide you high quality care today.  You may receive a patient satisfaction survey over the next few weeks. We would appreciate your feedback and comments so that we may continue to improve ourselves and the health of our patients.

## 2018-12-03 NOTE — Progress Notes (Signed)
[facility]    Provider:  Dr Jaynee Eagles Referring Provider: [name], MD Primary Care Physician:  [name], MD  CC:   Chief Complaint  Patient presents with   Follow-up    6 mon f/u. Wife present. Rm 9. Patient mentioned that he's not doing as well as he was the last time he was in our office. His wife is concerned that he may have had another stroke due his short term memory getting worse along with his vision.       HPI: 12/03/18 visit: [name] is an 85 year old male who is being seen today for 50-monthfollow-up [place] by his wife.  He has from a neurological standpoint since prior visit.  He did have hospitalization on 06/24/2018 for GI bleed and was recommended to discontinue Plavix and resume aspirin once stable.  He restarted aspirin on 07/19/2018 and denies additional bleeding or bruising.  Recent lipid panel obtained by PCP on 08/15/2018 showed LDL 120.  History of statin, Zetia and Repatha intolerance.  He does endorse dietary changes since that time and continuation of omega-3 fatty acids.  Blood pressure today 177/99 but does monitor at home and has been stable.  Recent A1c 5.7.  Loop recorder has not shown atrial fibrillation thus far.  He is currently being followed by orthopedics due to lumbar stenosis undergoing bone scan and if unremarkable, neck step would be radiofrequency ablation.  He continues to use Rollator walker for ambulation due to lower extremity weakness and radiculopathy pain from lumbar stenosis.  No further concerns at this time.    06/04/2018 visit: Mr. RHohis a 85year old male who is being followed in this office and is accompanied by his wife.  He continues to experience consistent dizziness.  Orthostatic blood pressures obtained which show lying down 193/94 HR 69, sitting 187/100 HR 73, and standing 136/81 heart rate 86.  He continues to be followed by his PCP for management but is also followed by cardiology.  He was  participating in PT but due to fluctuation of blood pressures, therapist discontinued until these can normalize due to safety concerns.  He continues to ambulate with a cane due to balance difficulties and frequent dizziness episodes.  He denies any type of edema.  When asked how much fluid intake he has throughout the day, his wife endorses that he drinks plenty but when questioned exact amount, she states approximately 20 ounce cup of water and then additional liquids with meals.  Repatha was started at prior appointment by having difficulty tolerating due to pain in joints and muscles.  He states pain worsens after injection and will start to subside towards the end of 2 weeks but then will have another injection and pain will increase.  He continues on aspirin and Plavix without side effects of bleeding or bruising.  He did have recent lipid panel obtained with LDL 70.  Loop recorder has not shown atrial fibrillation thus far.  No further concerns at this time and denies new or worsening stroke/TIA symptoms.   History summary: history 03/28/2018: Patient is being seen today for requested sooner follow-up by patient.  Patient called office on 02/09/2018 when she became extremely weak, dizzy, shaky and with blurry vision.  he followed up with his PCP with labs obtained, orthostatic vital signs done along with vision testing.  After prior visit, MRI cervical spine was ordered which was then performed and this was recommended to be obtained along with MRI brain due to her new  symptoms.  MRI brain obtained on 02/23/2018 which was reviewed and did show small acute stroke in the left splenium which was not present on MRI obtained 01/24/2018.  This MRI also reconfirmed prior right temporal lobe stroke along with chronic lacunar infarctions in the right basal ganglia, cerebellum and corona radiata.  MRI cervical spine was negative for acute abnormality.  Continues to experience dizziness which has worsened in the  past 1.5 months as he does have underlying dizziness issues after bypass procedure. Consistent dizzy sensation, does worsen with standing. Denies room spinning sensation. Blurry vision also continues which has been consistent since initial stroke in September.   Glucose levels have been good with recent readings at 113 this morning. He has been trying to avoid carbs and eating healthier.  Blood pressure today 163/93 and has been working with PCP/cardiologist to obtain normal readings  2D echo obtained on 02/01/2018 which showed ejection fraction 55 to 60% and negative for PFO.  After stroke found on 01/24/2018 scans, he was started on Plavix along with continuation of aspirin 81 mg.  He has continued on both his medications and denies any side effects of bleeding or bruising. Recent lab work by PCP on 01/30/2018 with a lipid panel showing total cholesterol 160, HDL 32, triglycerides 199 and LDL 98.  A1c 6.2.  He attempted to use Zetia for HLD management but states it caused increased dizziness compared to his dizziness prior to initiating and is worsening dizziness resolved after discontinuing Zetia.  Unfortunately, patient is intolerant to statins as he has tried atorvastatin, pravastatin and Crestor in the past which all cause muscle aches and pains which resolved after discontinuation.  Loop recorder placed on 02/19/2018  Patient is followed by Dr. Naaman Plummer in physical medicine for his chronic pain    HPI 01/30/2018 Dr. Jaynee Eagles:  [name] is a 85 y.o. male here as requested by Dr. Melford Aase for headaches. PMHx PVD, CAD, DIabetes with complications, dysautonomia orthostatic hypotension, DDD, CKD stage 3, vitamin B12 deficiency.  He woke up with a headache 2 months ago, he has a history of "sinus headaches", wife is here and says he has had headaches for much longer than 2 months, he takes daily tylenol to help with back and pain, no ibuprofen.  Wife says frequent headaches for longer. Worsened 2  months ago. He saw the eye doctor. Esr and crp were unremarkable. Several weeks ago his balance worsened likely around the time of the stoke. His headache is behind the right ear, and in the back of the head, worse with laying down on the back of his head, throbbing, he has chronic neck pain and degenerative arthritis. No recent falls.  Reviewed notes, labs and imaging from outside physicians, which showed:   MRI brain: reviewed imaging and agree with the following 01/24/2018:  IMPRESSION: 1. Small acute infarct in the right temporal stem. 2. Moderate to severe chronic small vessel ischemic disease. 3. 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 4. Advanced atherosclerosis involving the posterior circulation including multiple severe PCA stenoses. No large vessel occlusion.  hgba1c 5.8 ldl 122 TSH nml    Review of Systems: Patient complains of symptoms per HPI as well as the following symptoms: Memory loss, dizziness, headache, numbness and weakness. All others negative.   Social History   Socioeconomic History   Marital status: Married    Spouse name: Not on file   Number of children: 2   Years of education: Not on file   Highest  education level: High school graduate  Occupational History   Occupation: Retired  Scientist, product/process development strain: Not on file   Food insecurity    Worry: Not on file    Inability: Not on file   Transportation needs    Medical: Not on file    Non-medical: Not on file  Tobacco Use   Smoking status: Former Smoker    Quit date: 07/16/1963    Years since quitting: 55.4   Smokeless tobacco: Former Systems developer    Types: [place] date: 1989  Substance and Sexual Activity   Alcohol use: No   Drug use: No   Sexual activity: Not Currently  Lifestyle   Physical activity    Days per week: Not on file    Minutes per session: Not on file   Stress: Not on file  Relationships   Social connections    Talks on phone:  Not on file    Gets together: Not on file    Attends religious service: Not on file    Active member of club or organization: Not on file    Attends meetings of clubs or organizations: Not on file    Relationship status: Not on file   Intimate partner violence    Fear of current or ex partner: Not on file    Emotionally abused: Not on file    Physically abused: Not on file    Forced sexual activity: Not on file  Other Topics Concern   Not on file  Social History Narrative   Lives at home with his wife Estill Bamberg   Right handed   No caffeine    Family History  Problem Relation Age of Onset   Heart attack Father        died age 40   Heart attack Brother        died age 90   Anuerysm Brother        aortic   Heart attack Sister        died age 88   Colon cancer Sister    Liver cancer Sister    Diabetes Maternal Grandmother    Colon polyps Sister        and brothers x 2    Esophageal cancer Neg Hx    Inflammatory bowel disease Neg Hx    Pancreatic cancer Neg Hx    Stomach cancer Neg Hx     Past Medical History:  Diagnosis Date   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 If BP up he adjusts the Florinef   Orthostatic hypotension    "BP has been dropping alot when I stand up for the last month or so" (02/17/2016)   Vitamin B 12 deficiency    Vitamin D deficiency     Past Surgical History:  Procedure Laterality Date   BIOPSY  06/25/2018   Procedure: BIOPSY;  Surgeon: Rush Landmark Telford Nab., MD;  Location: [facility];  Service: Gastroenterology;;   BLADDER SURGERY  1969   traumatic pelvic fractures, urethral and bladder repair   CARDIAC CATHETERIZATION N/A 07/01/2015   Procedure: Left  Heart Cath and Coronary Angiography;  Surgeon: Wellington Hampshire, MD;  Location: [facility];  Service: Cardiovascular;  Laterality: N/A;   COLON RESECTION N/A 05/17/2017   Procedure: DIAGNOSTIC LAPAROSCOPY,;  Surgeon: Leighton Ruff, MD;  Location: [facility];  Service: General;  Laterality: N/A;   CORONARY ANGIOPLASTY WITH STENT PLACEMENT     CORONARY ARTERY BYPASS GRAFT N/A 07/06/2015   Procedure: CORONARY ARTERY BYPASS GRAFTING (CABG)x 4   utilizing the left internal mammary artery and endoscopically harvested bilateral  sapheneous vein.;  Surgeon: Ivin Poot, MD;  Location: [facility];  Service: Open Heart Surgery;  Laterality: N/A;   ESOPHAGOGASTRODUODENOSCOPY (EGD) WITH PROPOFOL N/A 06/25/2018   Procedure: ESOPHAGOGASTRODUODENOSCOPY (EGD) WITH PROPOFOL;  Surgeon: Rush Landmark Telford Nab., MD;  Location: [facility];  Service: Gastroenterology;  Laterality: N/A;   KNEE SURGERY     LOOP RECORDER INSERTION N/A 02/19/2018   Procedure: LOOP RECORDER INSERTION;  Surgeon: Deboraha Sprang, MD;  Location: [facility];  Service: Cardiovascular;  Laterality: N/A;   TEE WITHOUT CARDIOVERSION N/A 07/06/2015   Procedure: TRANSESOPHAGEAL ECHOCARDIOGRAM (TEE);  Surgeon: Ivin Poot, MD;  Location: [facility];  Service: Open Heart Surgery;  Laterality: N/A;    Current Outpatient Medications  Medication Sig Dispense Refill   acetaminophen (TYLENOL) 500 MG tablet Take 1,000 mg by mouth 2 (two) times daily as needed for headache (pain).      Ascorbic Acid (VITAMIN C) 500 MG CAPS Take 1 capsule by mouth daily.     aspirin EC 81 MG tablet Take 1 tablet (81 mg total) by mouth daily.     Calcium Carb-Cholecalciferol (CALCIUM+D3 PO) Take 1 tablet by mouth daily.     Cholecalciferol (VITAMIN D-3) 5000 units TABS Take 5,000 Units by mouth daily.     Ferrous Sulfate (IRON SLOW RELEASE) 140 (45 Fe) MG TBCR Take 1 tablet by mouth daily.     fludrocortisone (FLORINEF) 0.1 MG tablet TAKE 2 TABLETS BY  MOUTH EVERY DAY (Patient taking differently: 0.05 mg daily. ) 180 tablet 1   gabapentin (NEURONTIN) 100 MG capsule Take 1 to 2 capsules 2 to 3 x /day for Neuropathy Pain 540 capsule 2   glucose blood test strip Check blood sugar 1 time daily-[id number] 100 each 5   Magnesium 500 MG TABS Take by mouth.     Multiple Vitamin (MULTIVITAMIN WITH MINERALS) TABS tablet Take 1 tablet by mouth daily.     olmesartan (BENICAR) 40 MG tablet Take 40 mg by mouth as needed. Take 1/2 to 1 tablet daily for blood pressure.      Omega-3 Fatty Acids (FISH OIL OMEGA-3 PO) Take 1 capsule by mouth daily.     pantoprazole (PROTONIX) 40 MG tablet Take 1 tablet Daily for Acid Indigestion & Reflux 90 tablet 1   polyethylene glycol (MIRALAX / GLYCOLAX) packet Take 17 g by mouth daily. (Patient taking differently: Take 17 g by mouth 2 (two) times daily. ) 28 each 1   POTASSIUM CHLORIDE PO Take by mouth daily.     predniSONE (DELTASONE) 10 MG tablet Take 1 tablet 3 x /day as directed 100 tablet 1   vitamin B-12 (CYANOCOBALAMIN) 1000 MCG tablet Take 1,000 mcg by mouth daily.     No current facility-administered medications for this visit.     Allergies as of 12/03/2018 - Review Complete 12/03/2018  Allergen Reaction Noted   Cymbalta [duloxetine hcl] Other (See Comments) 03/18/2013   Keflex [cephalexin] Other (See Comments) 03/18/2013   Simvastatin Other (See Comments) 03/18/2013  Sudafed [pseudoephedrine] Other (See Comments) 03/18/2013    Vitals: BP (!) 177/99    Pulse 74    Temp 98.3 F (36.8 C) (Oral) Comment (Src): wife temp 98   Ht 6' (1.829 m)    Wt 192 lb 6.4 oz (87.3 kg)    BMI 26.09 kg/m  Last Weight:  Wt Readings from Last 1 Encounters:  12/03/18 192 lb 6.4 oz (87.3 kg)   Last Height:   Ht Readings from Last 1 Encounters:  12/03/18 6' (1.829 m)   Physical exam: Exam: Gen: NAD, pleasant elderly Caucasian male, conversant, well nourised, obese, well groomed                     CV:  RRR,  no MRG. No Carotid Bruits. No peripheral edema, warm, nontender Eyes: Conjunctivae clear without exudates or hemorrhage  Neuro: Detailed Neurologic Exam  Speech:    Speech is normal; fluent and spontaneous with normal comprehension.  Cognition:    The patient is oriented to person, place, and time;     recent and remote memory intact;     language fluent;     normal attention, concentration,     fund of knowledge intact Cranial Nerves:    The pupils are equal, round, and reactive to light.  Visual fields are full to finger confrontation. Extraocular movements are intact and negative for nystagmus. Trigeminal sensation is intact and the muscles of mastication are normal. The face is symmetric. The palate elevates in the midline. Hearing intact. Voice is normal. Shoulder shrug is normal. The tongue has normal motion without fasciculations.  Coordination:    Finger-to-nose performed accurately bilaterally.  Difficulty with shin to knee due to lower extremity pain lumbar stenosis Gait:    Imbalance on standing, using rolling walker, no shuffling, not ataxic.   Tone:    Normal muscle tone.   Posture:    Posture is slightly stooped Strength:    Equal strength in all tested extremities except mild hip flexor weakness bilaterally     Sensation: decreased LE distally to all modalities,     Reflex Exam:  DTR's:    Absent lowers, hypo uppers.    Toes:    The toes are equiv bilaterally.         Assessment/Plan:   85 y.o. Lovely male here as requested by Dr. Melford Aase for headaches. PMHx PVD, CAD, DIabetes with complications, dysautonomia orthostatic hypotension, DDD, CKD stage 3, vitamin B12 deficiency.  MRI of the brain showed an acute lacunar stroke likely secondary to small vessel disease due to significant vascular risk factors, chronic advanced microvascular disease however cannot rule out embolic/afib as cause.  Repeat MRI obtained due to recurrent dizzy symptoms with visual changes  and was found to have acute infarct in the left splenium on 02/23/2018.  He has been stable from a stroke standpoint without new or worsening stroke/TIA symptoms.  Due to recent GI bleed, Plavix discontinued and restarted on aspirin 81 mg daily.  -Continue aspirin for secondary stroke prevention -Continue omega-3 fatty acids and dietary measures for HLD management but statin intolerant.  Discussion Garing-year-old to lipid clinic but he would like to hold off at this time as he will be undergoing repeat colonoscopy on 12/17/2018 and potentially radiofrequency ablation for spinal stenosis.  Advised to follow-up PCP in regards to ongoing management of HLD and potential lipid clinic referral -Continue monitoring blood pressure at home and ongoing follow-up with cardiology for management -continue to monitor loop  recorder for atrial fibrillation -Maintain strict control of hypertension with blood pressure goal below 130/90, diabetes with hemoglobin A1c goal below 6.5% and cholesterol with LDL cholesterol (bad cholesterol) goal below 70 mg/dL. I also advised the patient to eat a healthy diet with plenty of whole grains, cereals, fruits and vegetables, exercise regularly and maintain ideal body weight.  Stable from stroke standpoint recommend follow-up as needed  Greater than 50% of time during this 25 minute visit was spent on counseling,explanation of diagnosis of multiple infarcts, reviewing risk factor management of HTN, HLD, DM, planning of further management, discussion with patient and family and coordination of care   Venancio Poisson, AGNP-BC  [facility] [address] [place], [place] [address]  Phone [phone] Fax [phone] Note: This document was prepared with digital dictation and possible smart phrase technology. Any transcriptional errors that result from this process are unintentional.

## 2018-12-05 NOTE — Progress Notes (Signed)
Carelink Summary Report / Loop Recorder 

## 2018-12-10 NOTE — Progress Notes (Signed)
I agree with the above plan 

## 2018-12-12 ENCOUNTER — Telehealth: Payer: Self-pay | Admitting: Gastroenterology

## 2018-12-12 NOTE — Telephone Encounter (Signed)
Pt's procedure was rescheduled from the 17th to 9/23 at [facility] and pt did not receive new instructions.  [name] reported that pt has a hx of strokes and is concerned about procedure.  Please return call.

## 2018-12-13 ENCOUNTER — Other Ambulatory Visit (HOSPITAL_COMMUNITY): Payer: Medicare Other

## 2018-12-13 NOTE — Telephone Encounter (Signed)
Spoke with the pt's wife  and she states that she had not received updated instructions for colon on 9/23.  Instructions were given over the phone and mailed.   I asked about concerns of the pt's hx of stroke and she says all her questions were answered by PCP and has nothing further to ask.  She was advised to call with any problems or concerns she may have in the future.

## 2018-12-17 ENCOUNTER — Encounter: Payer: Self-pay | Admitting: Specialist

## 2018-12-20 ENCOUNTER — Other Ambulatory Visit: Payer: Self-pay

## 2018-12-20 ENCOUNTER — Ambulatory Visit (INDEPENDENT_AMBULATORY_CARE_PROVIDER_SITE_OTHER): Payer: Medicare Other | Admitting: *Deleted

## 2018-12-20 ENCOUNTER — Ambulatory Visit (HOSPITAL_COMMUNITY)
Admission: RE | Admit: 2018-12-20 | Discharge: 2018-12-20 | Disposition: A | Discharge status: Home / Self Care | Payer: Medicare Other | Source: Ambulatory Visit | Attending: Specialist | Admitting: Specialist

## 2018-12-20 ENCOUNTER — Encounter (HOSPITAL_COMMUNITY)
Admission: RE | Admit: 2018-12-20 | Discharge: 2018-12-20 | Disposition: A | Discharge status: Home / Self Care | Payer: Medicare Other | Source: Ambulatory Visit | Attending: Specialist | Admitting: Specialist

## 2018-12-20 DIAGNOSIS — R001 Bradycardia, unspecified: Secondary | ICD-10-CM | POA: Diagnosis not present

## 2018-12-20 DIAGNOSIS — M542 Cervicalgia: Secondary | ICD-10-CM | POA: Diagnosis not present

## 2018-12-20 DIAGNOSIS — R634 Abnormal weight loss: Secondary | ICD-10-CM

## 2018-12-20 DIAGNOSIS — M545 Low back pain: Secondary | ICD-10-CM | POA: Diagnosis not present

## 2018-12-20 LAB — CUP PACEART REMOTE DEVICE CHECK
Date Time Interrogation Session: 20200820100748
Implantable Pulse Generator Implant Date: 20191021

## 2018-12-20 MED ORDER — TECHNETIUM TC 99M MEDRONATE IV KIT
21.7000 | PACK | Freq: Once | INTRAVENOUS | Status: AC | PRN
  Administered 2018-12-20: 21.7 via INTRAVENOUS

## 2018-12-26 ENCOUNTER — Encounter: Payer: Self-pay | Admitting: Specialist

## 2018-12-26 ENCOUNTER — Ambulatory Visit (INDEPENDENT_AMBULATORY_CARE_PROVIDER_SITE_OTHER): Payer: Medicare Other | Admitting: Specialist

## 2018-12-26 VITALS — BP 188/92 | HR 72 | Ht 72.0 in | Wt 183.0 lb

## 2018-12-26 DIAGNOSIS — M419 Scoliosis, unspecified: Secondary | ICD-10-CM | POA: Diagnosis not present

## 2018-12-26 DIAGNOSIS — M4724 Other spondylosis with radiculopathy, thoracic region: Secondary | ICD-10-CM | POA: Diagnosis not present

## 2018-12-26 DIAGNOSIS — G8911 Acute pain due to trauma: Secondary | ICD-10-CM

## 2018-12-26 DIAGNOSIS — M4815 Ankylosing hyperostosis [Forestier], thoracolumbar region: Secondary | ICD-10-CM

## 2018-12-26 DIAGNOSIS — M546 Pain in thoracic spine: Secondary | ICD-10-CM

## 2018-12-26 NOTE — Progress Notes (Addendum)
Office Visit Note   Patient: [name]           Date of Birth: Apr 19, 1934           MRN: [id number] Visit Date: 12/26/2018              Requested by: [name], [place] [address] [place],  [place] [address] PCP: [name], MD   Assessment & Plan: Visit Diagnoses:  1. Pain in thoracic spine   2. Acute pain due to trauma   3. Thoracic spondylosis with radiculopathy     Plan: Avoid bending, stooping and avoid lifting weights greater than 10 lbs. Avoid prolong standing and walking. Avoid frequent bending and stooping  No lifting greater than 10 lbs. May use ice or moist heat for pain. Weight loss is of benefit. Handicap license is approved  Follow-Up Instructions: Return in about 3 weeks (around 01/16/2019).   Orders:  Orders Placed This Encounter  Procedures  . MR Thoracic Spine w/o contrast   No orders of the defined types were placed in this encounter.     Procedures: No procedures performed   Clinical Data: Findings:  CLINICAL DATA:  Left neck and axilla pain. Right low back pain. Abnormal weight loss.  EXAM: NUCLEAR MEDICINE WHOLE BODY BONE SCAN  TECHNIQUE: Whole body anterior and posterior images were obtained approximately 3 hours after intravenous injection of radiopharmaceutical.  RADIOPHARMACEUTICALS:  21.7 mCi Technetium-16m MDP IV  COMPARISON:  Plain films 11/29/2018  FINDINGS: There is uptake noted in the midthoracic spine along the right side at costochondral junctions most compatible with degenerative uptake. Probable degenerative uptake in the concave portion of lower lumbar scoliosis. Degenerative uptake in the right knee and shoulders bilaterally. No suspicious bony uptake.  IMPRESSION: Uptake which appears degenerative in the midthoracic spine, lower lumbar spine, right knee and bilateral shoulders.   Electronically Signed   By: Rolm Baptise M.D.   On: 12/20/2018 16:24        Subjective: Chief Complaint  Patient presents with  . Lower Back - Follow-up    Bone scan review    85 year old male with history of mid thoracic pain and radiation into the left anterior chest wall under the left breast and left nipple. Pain is present and associated with intermittant sharp pains. He hurts in his shoulders, cervicothoracic junction and bilateral paralumbosacral area. No bowel or bladder difficulty. No pain with coughing or sneezing. Reports he has a friend that undergoing injections of the joints in the back with good pain relief and he is wondering if that will help him. History of CABG surgery with some of the pain starting following the CABG.  CTscan of the thoracolumbar area 18 months ago. Nuclear medicine study done last week.     Review of Systems  Constitutional: Positive for activity change.  HENT: Negative.   Eyes: Negative.   Respiratory: Negative.   Cardiovascular: Positive for chest pain.  Gastrointestinal: Negative.   Endocrine: Negative.   Genitourinary: Positive for flank pain.  Musculoskeletal: Positive for arthralgias, back pain, gait problem, joint swelling, neck pain and neck stiffness.  Skin: Negative.   Allergic/Immunologic: Negative.   Neurological: Negative for weakness and numbness.  Hematological: Negative.   Psychiatric/Behavioral: Negative.      Objective: Vital Signs: BP (!) 188/92 (BP Location: Left Arm, Patient Position: Sitting)   Pulse 72   Ht 6' (1.829 m)   Wt 183 lb (83 kg)   BMI 24.82  kg/m   Physical Exam  Right Knee Exam   Tenderness  The patient is experiencing tenderness in the medial joint line.  Range of Motion  Extension:  -5 abnormal  Flexion: 130   Tests  McMurray:  Medial - positive  Varus: negative Valgus: negative Lachman:  Anterior - negative    Posterior - negative Drawer:  Anterior - negative    Posterior - negative Pivot shift: negative Patellar apprehension: positive  Other  Erythema:  absent Scars: absent   Left Knee Exam   Range of Motion  Extension: normal  Flexion: normal   Tests  Lachman:  Anterior - negative    Posterior - negative Drawer:  Anterior - negative     Posterior - negative Pivot shift: negative Patellar apprehension: negative  Other  Erythema: absent Scars: absent      Specialty Comments:  No specialty comments available.  Imaging: No results found.   PMFS History: Patient Active Problem List   Diagnosis Date Noted  . Statin intolerance 11/16/2018  . 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 Hyperlipidemia, mixed 08/15/2018  . Low back pain 07/18/2018  . Acute blood loss anemia 06/24/2018  . Upper GI bleed 06/24/2018  . History of lacunar cerebrovascular accident (CVA) 02/12/2018  . Dizziness 02/12/2018  . Myofascial pain 12/18/2017  . Lumbar radiculopathy, right 12/18/2017  . Cholelithiasis 05/17/2017  . Sinus Bradycardia 05/17/2017  . Thoracic radiculopathy 04/12/2017  . Primary osteoarthritis of right knee 12/14/2016  . DDD (degenerative disc disease), lumbar 10/10/2016  . Lumbar facet arthropathy 08/29/2016  . Idiopathic scoliosis 03/22/2016  . Diabetic neuropathy ([place]) 03/22/2016  . Dysautonomia orthostatic hypotension syndrome ([place]) 02/25/2016  . CKD stage 3 due to type 2 diabetes mellitus ([place]) 02/19/2016  . Coronary artery disease involving coronary bypass graft of native heart without angina pectoris   . Diabetes mellitus type 2, diet-controlled ([place])   . 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 Medication management 08/11/2015  . 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 PVD (peripheral vascular disease) ([place]) 01/01/2014  . Hyperlipidemia associated with type 2 diabetes mellitus ([place]) 04/11/2013  . Supine hypertension   . Vitamin D deficiency   . Vitamin B 12 deficiency   . Coronary atherosclerosis- s/p PCI to LAD in 2009 and PCI to RCA in 2011 02/27/2009  . Abdominal aortic aneurysm ([place]) 02/27/2009  . GERD 02/27/2009   Past  Medical History:  Diagnosis Date  . 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 Colon polyps   . Coronary atherosclerosis of unspecified type of vessel, native or graft   . Diabetes ([place])   . Difficult intubation   . Esophageal reflux   . High cholesterol   . History of IBS 02/27/2009  . Hypertension    pt denies, he says he has a h/o hypotension. If BP up he adjusts the Florinef  . Orthostatic hypotension    "BP has been dropping alot when I stand up for the last month or so" (02/17/2016)  . Vitamin B 12 deficiency   . Vitamin D deficiency     Family History  Problem Relation Age of Onset  . Heart attack Father        died age 35  . Heart attack Brother        died age 85  . Anuerysm Brother        aortic  . Heart attack Sister        died age 33  . Colon cancer Sister   .  Liver cancer Sister   . Diabetes Maternal Grandmother   . Colon polyps Sister        and brothers x 2   . Esophageal cancer Neg Hx   . Inflammatory bowel disease Neg Hx   . Pancreatic cancer Neg Hx   . Stomach cancer Neg Hx     Past Surgical History:  Procedure Laterality Date  . BIOPSY  06/25/2018   Procedure: BIOPSY;  Surgeon: Rush Landmark Telford Nab., MD;  Location: [facility];  Service: Gastroenterology;;  . BLADDER SURGERY  1969   traumatic pelvic fractures, urethral and bladder repair  . CARDIAC CATHETERIZATION N/A 07/01/2015   Procedure: Left Heart Cath and Coronary Angiography;  Surgeon: Wellington Hampshire, MD;  Location: [facility];  Service: Cardiovascular;  Laterality: N/A;  . COLON RESECTION N/A 05/17/2017   Procedure: DIAGNOSTIC LAPAROSCOPY,;  Surgeon: Leighton Ruff, MD;  Location: [facility];  Service: General;  Laterality: N/A;  . CORONARY ANGIOPLASTY WITH STENT PLACEMENT    . CORONARY ARTERY BYPASS GRAFT N/A 07/06/2015   Procedure: CORONARY ARTERY BYPASS GRAFTING (CABG)x 4   utilizing the left internal mammary artery and endoscopically harvested bilateral  sapheneous vein.;  Surgeon: Ivin Poot, MD;  Location: [facility];  Service: Open Heart Surgery;  Laterality: N/A;  . ESOPHAGOGASTRODUODENOSCOPY (EGD) WITH PROPOFOL N/A 06/25/2018   Procedure: ESOPHAGOGASTRODUODENOSCOPY (EGD) WITH PROPOFOL;  Surgeon: Rush Landmark Telford Nab., MD;  Location: [facility];  Service: Gastroenterology;  Laterality: N/A;  . KNEE SURGERY    . LOOP RECORDER INSERTION N/A 02/19/2018   Procedure: LOOP RECORDER INSERTION;  Surgeon: Deboraha Sprang, MD;  Location: [facility];  Service: Cardiovascular;  Laterality: N/A;  . TEE WITHOUT CARDIOVERSION N/A 07/06/2015   Procedure: TRANSESOPHAGEAL ECHOCARDIOGRAM (TEE);  Surgeon: Ivin Poot, MD;  Location: [facility];  Service: Open Heart Surgery;  Laterality: N/A;   Social History   Occupational History  . Occupation: Retired  Tobacco Use  . Smoking status: Former Smoker    Quit date: 07/16/1963    Years since quitting: 55.4  . Smokeless tobacco: Former Systems developer    Types: Chew    Quit date: 1989  Substance and Sexual Activity  . Alcohol use: No  . Drug use: No  . Sexual activity: Not Currently

## 2018-12-27 ENCOUNTER — Other Ambulatory Visit: Payer: Self-pay | Admitting: Internal Medicine

## 2018-12-28 NOTE — Progress Notes (Signed)
Carelink Summary Report / Loop Recorder 

## 2019-01-01 DIAGNOSIS — M5134 Other intervertebral disc degeneration, thoracic region: Secondary | ICD-10-CM | POA: Diagnosis not present

## 2019-01-01 DIAGNOSIS — M9902 Segmental and somatic dysfunction of thoracic region: Secondary | ICD-10-CM | POA: Diagnosis not present

## 2019-01-03 ENCOUNTER — Ambulatory Visit
Admission: RE | Admit: 2019-01-03 | Discharge: 2019-01-03 | Disposition: A | Discharge status: Home / Self Care | Payer: Medicare Other | Source: Ambulatory Visit | Attending: Specialist | Admitting: Specialist

## 2019-01-03 ENCOUNTER — Other Ambulatory Visit: Payer: Self-pay

## 2019-01-03 DIAGNOSIS — G8911 Acute pain due to trauma: Secondary | ICD-10-CM

## 2019-01-03 DIAGNOSIS — M546 Pain in thoracic spine: Secondary | ICD-10-CM | POA: Diagnosis not present

## 2019-01-05 ENCOUNTER — Other Ambulatory Visit: Payer: Medicare Other

## 2019-01-06 DIAGNOSIS — Z1159 Encounter for screening for other viral diseases: Secondary | ICD-10-CM | POA: Diagnosis not present

## 2019-01-06 DIAGNOSIS — M549 Dorsalgia, unspecified: Secondary | ICD-10-CM | POA: Diagnosis not present

## 2019-01-06 DIAGNOSIS — Z79899 Other long term (current) drug therapy: Secondary | ICD-10-CM | POA: Diagnosis not present

## 2019-01-09 ENCOUNTER — Ambulatory Visit (INDEPENDENT_AMBULATORY_CARE_PROVIDER_SITE_OTHER): Payer: Medicare Other | Admitting: Specialist

## 2019-01-09 ENCOUNTER — Encounter: Payer: Self-pay | Admitting: Specialist

## 2019-01-09 VITALS — BP 188/86 | HR 71 | Ht 72.0 in | Wt 185.0 lb

## 2019-01-09 DIAGNOSIS — M545 Low back pain, unspecified: Secondary | ICD-10-CM

## 2019-01-09 DIAGNOSIS — M19012 Primary osteoarthritis, left shoulder: Secondary | ICD-10-CM | POA: Diagnosis not present

## 2019-01-09 DIAGNOSIS — M47814 Spondylosis without myelopathy or radiculopathy, thoracic region: Secondary | ICD-10-CM

## 2019-01-09 DIAGNOSIS — M47816 Spondylosis without myelopathy or radiculopathy, lumbar region: Secondary | ICD-10-CM | POA: Diagnosis not present

## 2019-01-09 DIAGNOSIS — M546 Pain in thoracic spine: Secondary | ICD-10-CM

## 2019-01-09 DIAGNOSIS — M19011 Primary osteoarthritis, right shoulder: Secondary | ICD-10-CM

## 2019-01-09 NOTE — Progress Notes (Addendum)
Office Visit Note   Patient: [name]           Date of Birth: Mar 05, 1934           MRN: [id number] Visit Date: 01/09/2019              Requested by: [name], [address] [place],  [place] [address] PCP: [name], MD   Assessment & Plan: Visit Diagnoses:  1. Pain in thoracic spine   2. Lumbar pain   3. Thoracic spondylosis   4. Spondylosis without myelopathy or radiculopathy, lumbar region   5. Primary osteoarthritis, left shoulder   6. Primary osteoarthritis, right shoulder     Plan:Avoid bending, stooping and avoid lifting weights greater than 10 lbs. Avoid prolong standing and walking. Avoid frequent bending and stooping  No lifting greater than 10 lbs. May use ice or moist heat for pain. Weight loss is of benefit. Handicap license is approved. Dr. Romona Curls secretary/Assistant will call to arrange for consideration of facet blocks and possibly RFA. Dr. Estanislado Pandy to assess for medical management of severe spondylosis pain thoracic and lumbar spine.   Follow-Up Instructions: No follow-ups on file.   Orders:  Orders Placed This Encounter  Procedures  . Ambulatory referral to Physical Medicine Rehab  . Ambulatory referral to Rheumatology   No orders of the defined types were placed in this encounter.     Procedures: No procedures performed   Clinical Data: No additional findings.   Subjective: Chief Complaint  Patient presents with  . Middle Back - Follow-up    85 year old male with history of back pain with no leg radiation he has pain with nearly any position, there are times the lower back and mid back is painful. He has pain under the left axilla.    Review of Systems   Objective: Vital Signs: BP (!) 188/86 (BP Location: Left Arm, Patient Position: Sitting)   Pulse 71   Ht 6' (1.829 m)   Wt 185 lb (83.9 kg)   BMI 25.09 kg/m   Physical Exam  Ortho Exam  Specialty Comments:  No specialty  comments available.  Imaging: No results found.   PMFS History: Patient Active Problem List   Diagnosis Date Noted  . Statin intolerance 11/16/2018  . 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 Hyperlipidemia, mixed 08/15/2018  . Low back pain 07/18/2018  . Acute blood loss anemia 06/24/2018  . Upper GI bleed 06/24/2018  . History of lacunar cerebrovascular accident (CVA) 02/12/2018  . Dizziness 02/12/2018  . Myofascial pain 12/18/2017  . Lumbar radiculopathy, right 12/18/2017  . Cholelithiasis 05/17/2017  . Sinus Bradycardia 05/17/2017  . Thoracic radiculopathy 04/12/2017  . Primary osteoarthritis of right knee 12/14/2016  . DDD (degenerative disc disease), lumbar 10/10/2016  . Lumbar facet arthropathy 08/29/2016  . Idiopathic scoliosis 03/22/2016  . Diabetic neuropathy ([place]) 03/22/2016  . Dysautonomia orthostatic hypotension syndrome ([place]) 02/25/2016  . CKD stage 3 due to type 2 diabetes mellitus ([place]) 02/19/2016  . Coronary artery disease involving coronary bypass graft of native heart without angina pectoris   . Diabetes mellitus type 2, diet-controlled ([place])   . 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 Medication management 08/11/2015  . 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 PVD (peripheral vascular disease) ([place]) 01/01/2014  . Hyperlipidemia associated with type 2 diabetes mellitus ([place]) 04/11/2013  . Supine hypertension   . Vitamin D deficiency   . Vitamin B 12 deficiency   . Coronary atherosclerosis- s/p  PCI to LAD in 2009 and PCI to RCA in 2011 02/27/2009  . Abdominal aortic aneurysm ([place]) 02/27/2009  . GERD 02/27/2009   Past Medical History:  Diagnosis Date  . 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 Colon polyps   . Coronary atherosclerosis of unspecified type of vessel, native or graft   . Diabetes ([place])   . Difficult intubation   . Esophageal reflux   . High cholesterol   . History of IBS 02/27/2009  . Hypertension    pt denies, he says he has a h/o hypotension. If BP up he  adjusts the Florinef  . Orthostatic hypotension    "BP has been dropping alot when I stand up for the last month or so" (02/17/2016)  . Vitamin B 12 deficiency   . Vitamin D deficiency     Family History  Problem Relation Age of Onset  . Heart attack Father        died age 32  . Heart attack Brother        died age 46  . Anuerysm Brother        aortic  . Heart attack Sister        died age 52  . Colon cancer Sister   . Liver cancer Sister   . Diabetes Maternal Grandmother   . Arthritis Mother   . Dementia Mother   . Heart Problems Brother   . Heart Problems Brother   . Heart Problems Brother   . Heart Problems Brother   . Healthy Daughter   . Healthy Son   . Esophageal cancer Neg Hx   . Inflammatory bowel disease Neg Hx   . Pancreatic cancer Neg Hx   . Stomach cancer Neg Hx     Past Surgical History:  Procedure Laterality Date  . BIOPSY  06/25/2018   Procedure: BIOPSY;  Surgeon: Rush Landmark Telford Nab., MD;  Location: [facility];  Service: Gastroenterology;;  . BLADDER SURGERY  1969   traumatic pelvic fractures, urethral and bladder repair  . CARDIAC CATHETERIZATION N/A 07/01/2015   Procedure: Left Heart Cath and Coronary Angiography;  Surgeon: Wellington Hampshire, MD;  Location: [facility];  Service: Cardiovascular;  Laterality: N/A;  . COLON RESECTION N/A 05/17/2017   Procedure: DIAGNOSTIC LAPAROSCOPY,;  Surgeon: Leighton Ruff, MD;  [facility];  Service: General;  Laterality: N/A;  . CORONARY ANGIOPLASTY WITH STENT PLACEMENT    . CORONARY ARTERY BYPASS GRAFT N/A 07/06/2015   Procedure: CORONARY ARTERY BYPASS GRAFTING (CABG)x 4   utilizing the left internal mammary artery and endoscopically harvested bilateral  sapheneous vein.;  Surgeon: Ivin Poot, MD;  Location: [facility];  Service: Open Heart Surgery;  Laterality: N/A;  . ESOPHAGOGASTRODUODENOSCOPY (EGD) WITH PROPOFOL N/A 06/25/2018   Procedure: ESOPHAGOGASTRODUODENOSCOPY (EGD) WITH PROPOFOL;  Surgeon:  Rush Landmark Telford Nab., MD;  Location: [facility];  Service: Gastroenterology;  Laterality: N/A;  . KNEE SURGERY    . LOOP RECORDER INSERTION N/A 02/19/2018   Procedure: LOOP RECORDER INSERTION;  Surgeon: Deboraha Sprang, MD;  Location: [facility];  Service: Cardiovascular;  Laterality: N/A;  . TEE WITHOUT CARDIOVERSION N/A 07/06/2015   Procedure: TRANSESOPHAGEAL ECHOCARDIOGRAM (TEE);  Surgeon: Ivin Poot, MD;  Location: [facility];  Service: Open Heart Surgery;  Laterality: N/A;   Social History   Occupational History  . Occupation: Retired  Tobacco Use  . Smoking status: Former Smoker    Quit date: 07/16/1963    Years since quitting: 55.5  .  Smokeless tobacco: Former Systems developer    Types: Chew    Quit date: 1989  Substance and Sexual Activity  . Alcohol use: No  . Drug use: No  . Sexual activity: Not Currently

## 2019-01-09 NOTE — Patient Instructions (Signed)
Avoid bending, stooping and avoid lifting weights greater than 10 lbs. Avoid prolong standing and walking. Avoid frequent bending and stooping  No lifting greater than 10 lbs. May use ice or moist heat for pain. Weight loss is of benefit. Handicap license is approved. Dr. Romona Curls secretary/Assistant will call to arrange for consideration of facet blocks and possibly RFA. Dr. Estanislado Pandy to assess for medical management of severe spondylosis pain thoracic and lumbar spine.

## 2019-01-10 ENCOUNTER — Telehealth: Payer: Self-pay | Admitting: Gastroenterology

## 2019-01-10 ENCOUNTER — Emergency Department (HOSPITAL_COMMUNITY)
Admission: EM | Admit: 2019-01-10 | Discharge: 2019-01-10 | Disposition: A | Discharge status: Home / Self Care | Payer: Medicare Other | Attending: Emergency Medicine | Admitting: Emergency Medicine

## 2019-01-10 ENCOUNTER — Other Ambulatory Visit: Payer: Self-pay | Admitting: Specialist

## 2019-01-10 ENCOUNTER — Other Ambulatory Visit: Payer: Self-pay

## 2019-01-10 ENCOUNTER — Encounter (HOSPITAL_COMMUNITY): Payer: Self-pay

## 2019-01-10 DIAGNOSIS — E1122 Type 2 diabetes mellitus with diabetic chronic kidney disease: Secondary | ICD-10-CM | POA: Insufficient documentation

## 2019-01-10 DIAGNOSIS — Z79899 Other long term (current) drug therapy: Secondary | ICD-10-CM | POA: Insufficient documentation

## 2019-01-10 DIAGNOSIS — Z87891 Personal history of nicotine dependence: Secondary | ICD-10-CM | POA: Diagnosis not present

## 2019-01-10 DIAGNOSIS — M545 Low back pain, unspecified: Secondary | ICD-10-CM

## 2019-01-10 DIAGNOSIS — I129 Hypertensive chronic kidney disease with stage 1 through stage 4 chronic kidney disease, or unspecified chronic kidney disease: Secondary | ICD-10-CM | POA: Diagnosis not present

## 2019-01-10 DIAGNOSIS — N183 Chronic kidney disease, stage 3 (moderate): Secondary | ICD-10-CM | POA: Diagnosis not present

## 2019-01-10 DIAGNOSIS — Z7982 Long term (current) use of aspirin: Secondary | ICD-10-CM | POA: Insufficient documentation

## 2019-01-10 DIAGNOSIS — I2510 Atherosclerotic heart disease of native coronary artery without angina pectoris: Secondary | ICD-10-CM | POA: Diagnosis not present

## 2019-01-10 DIAGNOSIS — Z951 Presence of aortocoronary bypass graft: Secondary | ICD-10-CM | POA: Diagnosis not present

## 2019-01-10 DIAGNOSIS — G8929 Other chronic pain: Secondary | ICD-10-CM | POA: Diagnosis not present

## 2019-01-10 MED ORDER — OXYCODONE-ACETAMINOPHEN 5-325 MG PO TABS: 1.0000 | ORAL_TABLET | Freq: Four times a day (QID) | ORAL | 0 refills | Status: DC | PRN

## 2019-01-10 NOTE — Discharge Instructions (Addendum)
Please return for any problem.  Follow-up with your regular care provider as instructed. °

## 2019-01-10 NOTE — ED Notes (Signed)
Discharge BP was addressed with patient. Patient expressed a desire to go home and take home BP medication rather than have it addressed by the MD here. RN expressed concern about his blood pressure being so high. Wife also stated that his blood pressure was at "stroke level". Patient stated that this is not abnormal for him and if he were to stand it would go down. RN reinforced that the doctor could come look at the patient before he left to address current BP. Patient again refused and told the RN he wanted to go home and just take his on medication.

## 2019-01-10 NOTE — Telephone Encounter (Signed)
I called and lmom for pt to call us back

## 2019-01-10 NOTE — ED Provider Notes (Signed)
[facility] DEPT Provider Note   CSN: [id number] Arrival date & time: 01/10/19  1302     History   Chief Complaint Chief Complaint  Patient presents with  . Back Pain    HPI [name] is a 85 y.o. male.     85 year old male with prior medical history as detailed below presents for evaluation of chronic back pain.  Patient reports longstanding history of chronic back pain.  Patient reports increasing pain over the last 1 to 2 weeks.  He has been seen by both his regular care provider and also by an urgent care for the same complaint.  Patient denies any significant acute inciting factor.  He reports that over the last several days has been trialing Norco with minimal improvement in his symptoms.  He is being referred for possible nerve block.  This referral will not occur until later this month.  Patient denies numbness to his lower extremities, fever, bowel movement change, nausea, vomiting, chest pain, shortness breath, or other acute complaint.  He is ambulatory during the evaluation.  The history is provided by the patient, medical records and the spouse.  Back Pain Location:  Generalized Quality:  Aching Radiates to:  Does not radiate Pain severity:  Mild Onset quality:  Gradual Timing:  Constant Progression:  Waxing and waning Chronicity:  New Relieved by:  Nothing Worsened by:  Nothing   Past Medical History:  Diagnosis Date  . 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 Colon polyps   . Coronary atherosclerosis of unspecified type of vessel, native or graft   . Diabetes ([place])   . Difficult intubation   . Esophageal reflux   . High cholesterol   . History of IBS 02/27/2009  . Hypertension    pt denies, he says he has a h/o hypotension. If BP up he adjusts the Florinef  . Orthostatic hypotension    "BP has been dropping alot when I stand up for the last month or so" (02/17/2016)  . Vitamin B 12 deficiency   . Vitamin D  deficiency     Patient Active Problem List   Diagnosis Date Noted  . Statin intolerance 11/16/2018  . 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 Hyperlipidemia, mixed 08/15/2018  . Low back pain 07/18/2018  . Acute blood loss anemia 06/24/2018  . Upper GI bleed 06/24/2018  . History of lacunar cerebrovascular accident (CVA) 02/12/2018  . Dizziness 02/12/2018  . Myofascial pain 12/18/2017  . Lumbar radiculopathy, right 12/18/2017  . Cholelithiasis 05/17/2017  . Sinus Bradycardia 05/17/2017  . Thoracic radiculopathy 04/12/2017  . Primary osteoarthritis of right knee 12/14/2016  . DDD (degenerative disc disease), lumbar 10/10/2016  . Lumbar facet arthropathy 08/29/2016  . Idiopathic scoliosis 03/22/2016  . Diabetic neuropathy ([place]) 03/22/2016  . Dysautonomia orthostatic hypotension syndrome ([place]) 02/25/2016  . CKD stage 3 due to type 2 diabetes mellitus ([place]) 02/19/2016  . Coronary artery disease involving coronary bypass graft of native heart without angina pectoris   . Diabetes mellitus type 2, diet-controlled ([place])   . 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 Medication management 08/11/2015  . 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 PVD (peripheral vascular disease) ([place]) 01/01/2014  . Hyperlipidemia associated with type 2 diabetes mellitus ([place]) 04/11/2013  . Supine hypertension   . Vitamin D deficiency   . Vitamin B 12 deficiency   . Coronary atherosclerosis- s/p PCI to LAD in 2009 and PCI to RCA in 2011 02/27/2009  . Abdominal aortic aneurysm (  [place]) 02/27/2009  . GERD 02/27/2009    Past Surgical History:  Procedure Laterality Date  . BIOPSY  06/25/2018   Procedure: BIOPSY;  Surgeon: Rush Landmark Telford Nab., MD;  Location: [facility];  Service: Gastroenterology;;  . BLADDER SURGERY  1969   traumatic pelvic fractures, urethral and bladder repair  . CARDIAC CATHETERIZATION N/A 07/01/2015   Procedure: Left Heart Cath and Coronary Angiography;  Surgeon: Wellington Hampshire, MD;  Location: [facility];  Service: Cardiovascular;  Laterality: N/A;  . COLON RESECTION N/A 05/17/2017   Procedure: DIAGNOSTIC LAPAROSCOPY,;  Surgeon: Leighton Ruff, MD;  Location: [facility];  Service: General;  Laterality: N/A;  . CORONARY ANGIOPLASTY WITH STENT PLACEMENT    . CORONARY ARTERY BYPASS GRAFT N/A 07/06/2015   Procedure: CORONARY ARTERY BYPASS GRAFTING (CABG)x 4   utilizing the left internal mammary artery and endoscopically harvested bilateral  sapheneous vein.;  Surgeon: Ivin Poot, MD;  Location: [facility];  Service: Open Heart Surgery;  Laterality: N/A;  . ESOPHAGOGASTRODUODENOSCOPY (EGD) WITH PROPOFOL N/A 06/25/2018   Procedure: ESOPHAGOGASTRODUODENOSCOPY (EGD) WITH PROPOFOL;  Surgeon: Rush Landmark Telford Nab., MD;  Location: [facility];  Service: Gastroenterology;  Laterality: N/A;  . KNEE SURGERY    . LOOP RECORDER INSERTION N/A 02/19/2018   Procedure: LOOP RECORDER INSERTION;  Surgeon: Deboraha Sprang, MD;  Location: [facility];  Service: Cardiovascular;  Laterality: N/A;  . TEE WITHOUT CARDIOVERSION N/A 07/06/2015   Procedure: TRANSESOPHAGEAL ECHOCARDIOGRAM (TEE);  Surgeon: Ivin Poot, MD;  Location: [facility];  Service: Open Heart Surgery;  Laterality: N/A;        Home Medications    Prior to Admission medications   Medication Sig Start Date End Date Taking? Authorizing Provider  acetaminophen (TYLENOL) 500 MG tablet Take 1,000 mg by mouth 2 (two) times daily as needed for headache (pain).     [provider]  Ascorbic Acid (VITAMIN C) 500 MG CAPS Take 1 capsule by mouth daily.    [provider]  aspirin EC 81 MG tablet Take 1 tablet (81 mg total) by mouth daily. 07/19/18   Croitoru, Mihai, MD  butalbital-acetaminophen-caffeine (FIORICET) 50-325-40 MG tablet TAKE 1 TABLET EVERY 4 HOURS ONLY IF SEVERE HEADACHE 12/27/18   [name], MD  Calcium Carb-Cholecalciferol (CALCIUM+D3 PO) Take 1 tablet by mouth daily.    [provider]   Cholecalciferol (VITAMIN D-3) 5000 units TABS Take 5,000 Units by mouth daily.    [provider]  Ferrous Sulfate (IRON SLOW RELEASE) 140 (45 Fe) MG TBCR Take 1 tablet by mouth daily.    [provider]  fludrocortisone (FLORINEF) 0.1 MG tablet TAKE 2 TABLETS BY MOUTH EVERY DAY Patient taking differently: 0.05 mg daily.  09/04/18   Vicie Mutters, PA-C  gabapentin (NEURONTIN) 100 MG capsule Take 1 to 2 capsules 2 to 3 x /day for Neuropathy Pain 10/13/18   [name], MD  glucose blood test strip Check blood sugar 1 time daily-DX-E11.9 07/07/17   [name], MD  Magnesium 500 MG TABS Take by mouth.    [provider]  Multiple Vitamin (MULTIVITAMIN WITH MINERALS) TABS tablet Take 1 tablet by mouth daily.    [provider]  olmesartan (BENICAR) 40 MG tablet Take 40 mg by mouth as needed. Take 1/2 to 1 tablet daily for blood pressure.     [provider]  Omega-3 Fatty Acids (FISH OIL OMEGA-3 PO) Take 1 capsule by mouth daily.    [provider]  oxyCODONE-acetaminophen (PERCOCET/ROXICET) 5-325 MG tablet Take 1 tablet by mouth every 6 (six) hours as needed for severe pain. 01/10/19   Valarie Merino, MD  pantoprazole (PROTONIX) 40 MG tablet Take 1 tablet Daily for Acid Indigestion & Reflux 09/14/18   [name], MD  polyethylene glycol [facility] / Floria Raveling) packet Take 17 g by mouth daily. Patient taking differently: Take 17 g by mouth 2 (two) times daily.  05/22/17   Barton Dubois, MD  POTASSIUM CHLORIDE PO Take by mouth daily.    [provider]  predniSONE (DELTASONE) 10 MG tablet Take 1 tablet 3 x /day as directed 11/06/18   [name], MD  vitamin B-12 (CYANOCOBALAMIN) 1000 MCG tablet Take 1,000 mcg by mouth daily.    [provider]    Family History Family History  Problem Relation Age of Onset  . Heart attack Father        died age 2  . Heart attack Brother        died age 91  . Anuerysm  Brother        aortic  . Heart attack Sister        died age 38  . Colon cancer Sister   . Liver cancer Sister   . Diabetes Maternal Grandmother   . Colon polyps Sister        and brothers x 2   . Esophageal cancer Neg Hx   . Inflammatory bowel disease Neg Hx   . Pancreatic cancer Neg Hx   . Stomach cancer Neg Hx     Social History Social History   Tobacco Use  . Smoking status: Former Smoker    Quit date: 07/16/1963    Years since quitting: 55.5  . Smokeless tobacco: Former Systems developer    Types: Chew    Quit date: 1989  Substance Use Topics  . Alcohol use: No  . Drug use: No     Allergies   Cymbalta [duloxetine hcl], Keflex [cephalexin], Simvastatin, and Sudafed [pseudoephedrine]   Review of Systems Review of Systems  Musculoskeletal: Positive for back pain.  All other systems reviewed and are negative.    Physical Exam Updated Vital Signs BP (!) 189/102 (BP Location: Left Arm)   Pulse 76   Temp 98 F (36.7 C) (Oral)   Resp 16   Ht 6' (1.829 m)   Wt 83.9 kg   SpO2 96%   BMI 25.09 kg/m   Physical Exam Vitals signs and nursing note reviewed.  Constitutional:      General: He is not in acute distress.    Appearance: He is well-developed.  HENT:     Head: Normocephalic and atraumatic.  Eyes:     Conjunctiva/sclera: Conjunctivae normal.     Pupils: Pupils are equal, round, and reactive to light.  Neck:     Musculoskeletal: Normal range of motion and neck supple.  Cardiovascular:     Rate and Rhythm: Normal rate and regular rhythm.     Heart sounds: Normal heart sounds.  Pulmonary:     Effort: Pulmonary effort is normal. No respiratory distress.     Breath sounds: Normal breath sounds.  Abdominal:     General: There is no distension.     Palpations: Abdomen is soft.     Tenderness: There is no abdominal tenderness.  Musculoskeletal: Normal range of motion.        General: No deformity.  Skin:    General: Skin is warm and dry.  Neurological:  General: No focal deficit present.     Mental Status: He is alert and oriented to person, place, and time.      ED Treatments / Results  Labs (all labs ordered are listed, but only abnormal results are displayed) Labs Reviewed - No data to display  EKG None  Radiology No results found.  Procedures Procedures (including critical care time)  Medications Ordered in ED Medications - No data to display   Initial Impression / Assessment and Plan / ED Course  I have reviewed the triage vital signs and the nursing notes.  Pertinent labs & imaging results that were available during my care of the patient were reviewed by me and considered in my medical decision making (see chart for details).        MDM  Screen complete  [name] was evaluated in Emergency Department on 01/10/2019 for the symptoms described in the history of present illness. He was evaluated in the context of the global COVID-19 pandemic, which necessitated consideration that the patient might be at risk for infection with the SARS-CoV-2 virus that causes COVID-19. Institutional protocols and algorithms that pertain to the evaluation of patients at risk for COVID-19 are in a state of rapid change based on information released by regulatory bodies including the CDC and federal and state organizations. These policies and algorithms were followed during the patient's care in the ED.   Patient is presenting with complaint of chronic back pain.  Patient is without any acute complaint.  His symptoms have been ongoing for at least the last 2 weeks if not longer.  Patient is requesting stronger pain medication that he has trialed previously.  Given patient's reported discomfort we will trial a short course of Percocet.  Patient is advised to very closely follow-up with his regular care providers.  He and his wife are aware of the potential problems with routine use of narcotics.  Importance of close follow-up was  repeatedly stressed.  Strict return precautions given and understood.  Final Clinical Impressions(s) / ED Diagnoses   Final diagnoses:  Chronic low back pain without sciatica, unspecified back pain laterality    ED Discharge Orders         Ordered    oxyCODONE-acetaminophen (PERCOCET/ROXICET) 5-325 MG tablet  Every 6 hours PRN,   Status:  Discontinued     01/10/19 1631    oxyCODONE-acetaminophen (PERCOCET/ROXICET) 5-325 MG tablet  Every 6 hours PRN     01/10/19 1631           Valarie Merino, MD 01/10/19 1635

## 2019-01-10 NOTE — ED Triage Notes (Signed)
Patient c/o mid back pain x 2 months. Patient states he has been going to an orthopedic surgeon and recently had a MRI. Patient states he has arthritis in his back and came to the ED due to increased pain.

## 2019-01-10 NOTE — Telephone Encounter (Signed)
The pt as been cancelled for 9/23 due to severe back pain and appt with ortho scheduled for that day.  I did advise the pt and his wife to call back when he is ready to get back on the schedule.

## 2019-01-10 NOTE — Telephone Encounter (Signed)
Pt called in requesting a call back form christy, regarding the pt not feeling well.   [phone]

## 2019-01-10 NOTE — Telephone Encounter (Signed)
[name] needs to r/s procedure scheduled at hospital. Pls call her.

## 2019-01-10 NOTE — Telephone Encounter (Signed)
Patty, I am sorry that he is having such issues. Okay for him to reschedule as soon as he is ready. Thank you. GM

## 2019-01-11 NOTE — Telephone Encounter (Signed)
Can you please call and check on him? I have not been able to reach him.

## 2019-01-11 NOTE — Telephone Encounter (Signed)
Spoke with patient. He went to [facility] ED yesterday for increased back pain. He said that they gave him percocet 5-325, but those have not helped with his pain either.No further imaging or testing was done, per patient. He is scheduled to see Dr.Newton on 01/23/2019 and has already advised his staff that he would like to be seen sooner if a cancellation occurs. He was very appreciative of the phone call to check on him.

## 2019-01-12 ENCOUNTER — Other Ambulatory Visit: Payer: Medicare Other

## 2019-01-14 ENCOUNTER — Other Ambulatory Visit: Payer: Self-pay | Admitting: Internal Medicine

## 2019-01-14 ENCOUNTER — Telehealth: Payer: Self-pay | Admitting: *Deleted

## 2019-01-14 ENCOUNTER — Telehealth: Payer: Self-pay | Admitting: Specialist

## 2019-01-14 MED ORDER — GABAPENTIN 600 MG PO TABS: ORAL_TABLET | ORAL | 1 refills | Status: DC

## 2019-01-14 NOTE — Telephone Encounter (Signed)
Patient was seen at the ER and he was rx'd this for pain and he would like a refill, he is sched to see Dr. Ernestina Patches on 01/23/2019

## 2019-01-14 NOTE — Telephone Encounter (Signed)
spose called and reported patient in severe pain. An RX was sent to the pharmacy for Gabapentin 600 mg 1/2 to q 3 times a day. Spouse is aware and will try the increased dose.

## 2019-01-14 NOTE — Telephone Encounter (Signed)
Patient came into the office concerning the refill on his pain medication and also to let Dr Louanne Skye know that he can not sleep because of the pain. I advised patient that Dr Louanne Skye was not in the office yet and Christy sent the message to him.

## 2019-01-14 NOTE — Progress Notes (Signed)
Virtual Visit via Video Note changed to phone visit at patient request.   This visit type was conducted due to national recommendations for restrictions regarding the COVID-19 Pandemic (e.g. social distancing) in an effort to limit this patient's exposure and mitigate transmission in our community.  Due to his co-morbid illnesses, this patient is at least at moderate risk for complications without adequate follow up.  This format is felt to be most appropriate for this patient at this time.  All issues noted in this document were discussed and addressed.  A limited physical exam was performed with this format.  Please refer to the patient's chart for his consent to telehealth for [facility]: [facility] Rhonda W.   Date:  01/15/2019   ID:  [name], DOB Sep 02, 1933, MRN [id number]  Patient Location:Home Provider Location: Home  PCP:  Dennis Pinto, MD  Cardiologist:  Dr Stanford Breed  Evaluation Performed:  Follow-Up Visit  Chief Complaint:  FU CAD  History of Present Illness:    FU CAD, h/o HTN,postural hypotension andHLD. Cath 3/17revealed 3 V CAD, echo showed normal LVF. Preoperative evaluation included carotids and ABIs that were normal. He underwent CABG x 4 on 07/07/15. Post op course was unremarkable. He has had difficulties with orthostasis and hypotension sinceCABG. 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 Echocardiogram October 2019 showed normal LV function, mild diastolic dysfunction, mild left atrial enlargement.  Abdominal ultrasound October 2019 showed 3.8 cm abdominal aortic aneurysm. Had loop monitor placed October 2019. Had GI bleed February 2020.  Since last seen,there is no dyspnea, chest pain or pedal edema.  He continues to have dizziness with standing but no syncope.  The patient does not have symptoms concerning for COVID-19 infection (fever, chills, cough, or new shortness of breath).    Past Medical History:  Diagnosis Date  . Aneurysm  of iliac artery (HCC)   . Colon polyps   . Coronary atherosclerosis of unspecified type of vessel, native or graft   . Diabetes ([place])   . Difficult intubation   . Esophageal reflux   . High cholesterol   . History of IBS 02/27/2009  . Hypertension    pt denies, he says he has a h/o hypotension. If BP up he adjusts the Florinef  . Orthostatic hypotension    "BP has been dropping alot when I stand up for the last month or so" (02/17/2016)  . Vitamin B 12 deficiency   . Vitamin D deficiency    Past Surgical History:  Procedure Laterality Date  . BIOPSY  06/25/2018   Procedure: BIOPSY;  Surgeon: Rush Landmark Telford Nab., MD;  Location: [facility];  Service: Gastroenterology;;  . BLADDER SURGERY  1969   traumatic pelvic fractures, urethral and bladder repair  . CARDIAC CATHETERIZATION N/A 07/01/2015   Procedure: Left Heart Cath and Coronary Angiography;  Surgeon: Wellington Hampshire, MD;  Location: [facility];  Service: Cardiovascular;  Laterality: N/A;  . COLON RESECTION N/A 05/17/2017   Procedure: DIAGNOSTIC LAPAROSCOPY,;  Surgeon: Leighton Ruff, MD;  [facility];  Service: General;  Laterality: N/A;  . CORONARY ANGIOPLASTY WITH STENT PLACEMENT    . CORONARY ARTERY BYPASS GRAFT N/A 07/06/2015   Procedure: CORONARY ARTERY BYPASS GRAFTING (CABG)x 4   utilizing the left internal mammary artery and endoscopically harvested bilateral  sapheneous vein.;  Surgeon: Ivin Poot, MD;  Location: [facility];  Service: Open Heart Surgery;  Laterality: N/A;  . ESOPHAGOGASTRODUODENOSCOPY (EGD) WITH PROPOFOL N/A 06/25/2018  Procedure: ESOPHAGOGASTRODUODENOSCOPY (EGD) WITH PROPOFOL;  Surgeon: Rush Landmark Telford Nab., MD;  Location: [facility];  Service: Gastroenterology;  Laterality: N/A;  . KNEE SURGERY    . LOOP RECORDER INSERTION N/A 02/19/2018   Procedure: LOOP RECORDER INSERTION;  Surgeon: Deboraha Sprang, MD;  Location: [place] [facility];  Service: Cardiovascular;  Laterality: N/A;  . TEE  WITHOUT CARDIOVERSION N/A 07/06/2015   Procedure: TRANSESOPHAGEAL ECHOCARDIOGRAM (TEE);  Surgeon: Ivin Poot, MD;  Location: [facility];  Service: Open Heart Surgery;  Laterality: N/A;     Current Meds  Medication Sig  . acetaminophen (TYLENOL) 500 MG tablet Take 1,000 mg by mouth 2 (two) times daily as needed for headache (pain).   . Ascorbic Acid (VITAMIN C) 500 MG CAPS Take 1 capsule by mouth daily.  Marland Kitchen aspirin EC 81 MG tablet Take 1 tablet (81 mg total) by mouth daily.  . butalbital-acetaminophen-caffeine (FIORICET) 50-325-40 MG tablet TAKE 1 TABLET EVERY 4 HOURS ONLY IF SEVERE HEADACHE  . Calcium Carb-Cholecalciferol (CALCIUM+D3 PO) Take 1 tablet by mouth daily.  . Cholecalciferol (VITAMIN D-3) 5000 units TABS Take 5,000 Units by mouth daily.  . Ferrous Sulfate (IRON SLOW RELEASE) 140 (45 Fe) MG TBCR Take 1 tablet by mouth daily.  . fludrocortisone (FLORINEF) 0.1 MG tablet TAKE 2 TABLETS BY MOUTH EVERY DAY (Patient taking differently: 0.05 mg daily. )  . glucose blood test strip Check blood sugar 1 time daily-DX-E11.9  . Magnesium 500 MG TABS Take 1 tablet by mouth daily.   . Multiple Vitamin (MULTIVITAMIN WITH MINERALS) TABS tablet Take 1 tablet by mouth daily.  . Omega-3 Fatty Acids (FISH OIL OMEGA-3 PO) Take 1 capsule by mouth daily.  . pantoprazole (PROTONIX) 40 MG tablet Take 1 tablet Daily for Acid Indigestion & Reflux  . polyethylene glycol (MIRALAX / GLYCOLAX) packet Take 17 g by mouth daily. (Patient taking differently: Take 17 g by mouth 2 (two) times daily. )  . POTASSIUM CHLORIDE PO Take 595 mg by mouth daily.   . vitamin B-12 (CYANOCOBALAMIN) 1000 MCG tablet Take 1,000 mcg by mouth daily.  . Zinc 50 MG TABS Take 1 tablet by mouth daily.     Allergies:   Cymbalta [duloxetine hcl], Keflex [cephalexin], Simvastatin, and Sudafed [pseudoephedrine]   Social History   Tobacco Use  . Smoking status: Former Smoker    Quit date: 07/16/1963    Years since quitting: 55.5  .  Smokeless tobacco: Former Systems developer    Types: Chew    Quit date: 1989  Substance Use Topics  . Alcohol use: No  . Drug use: No     Family Hx: The patient's family history includes Anuerysm in his brother; Colon cancer in his sister; Colon polyps in his sister; Diabetes in his maternal grandmother; Heart attack in his brother, father, and sister; Liver cancer in his sister. There is no history of Esophageal cancer, Inflammatory bowel disease, Pancreatic cancer, or Stomach cancer.  ROS:   Please see the history of present illness.    No Fever, chills  or productive cough; pt complains of back pain All other systems reviewed and are negative.   Recent Labs: 08/15/2018: Magnesium 2.4; TSH 2.76 09/27/2018: ALT 10; BUN 19; Creat 1.50; Hemoglobin 12.7; Platelets 225; Potassium 4.9; Sodium 144   Recent Lipid Panel Lab Results  Component Value Date/Time   CHOL 184 08/15/2018 11:17 AM   TRIG 164 (H) 08/15/2018 11:17 AM   HDL 36 (L) 08/15/2018 11:17 AM   CHOLHDL 5.1 (H) 08/15/2018  11:17 AM   LDLCALC 120 (H) 08/15/2018 11:17 AM    Wt Readings from Last 3 Encounters:  01/15/19 185 lb (83.9 kg)  01/10/19 185 lb (83.9 kg)  01/09/19 185 lb (83.9 kg)     Objective:    Vital Signs:  BP (!) 151/80   Pulse 77   Ht 6' (1.829 m)   Wt 185 lb (83.9 kg)   BMI 25.09 kg/m    VITAL SIGNS:  reviewed NAD Answers questions appropriately Normal affect Remainder of physical examination not performed (telehealth visit; coronavirus pandemic)  ASSESSMENT & PLAN:    1. CAD-no recurrent chest pain.  Continue medical therapy with aspirin.  Patient is intolerant to statins. 2. Hyperlipidemia-patient is intolerant to statins.  He also did not tolerate Zetia or Repatha.  Continue diet. 3. Orthostasis-Continue present dose of Florinef.  I also discussed increasing hydration and increasing sodium intake.  I am allowing his blood pressure to run higher so that his orthostatic symptoms are not as severe. 4.  Abdominal aortic aneurysm-we will arrange follow-up abdominal ultrasound 10/20. 5. Prior CVA-implantable loop monitor in place.  COVID-19 Education: The importance of social distancing was discussed today.  Time:   Today, I have spent 17 minutes with the patient with telehealth technology discussing the above problems.     Medication Adjustments/Labs and Tests Ordered: Current medicines are reviewed at length with the patient today.  Concerns regarding medicines are outlined above.   Tests Ordered: No orders of the defined types were placed in this encounter.   Medication Changes: No orders of the defined types were placed in this encounter.   Follow Up:  Virtual Visit or In Person in 1 year(s)  Signed, Kirk Ruths, MD  01/15/2019 9:29 AM    [place]

## 2019-01-15 ENCOUNTER — Encounter: Payer: Self-pay | Admitting: Cardiology

## 2019-01-15 ENCOUNTER — Ambulatory Visit: Payer: Medicare Other | Admitting: Adult Health Nurse Practitioner

## 2019-01-15 ENCOUNTER — Telehealth (INDEPENDENT_AMBULATORY_CARE_PROVIDER_SITE_OTHER): Payer: Medicare Other | Admitting: Cardiology

## 2019-01-15 VITALS — BP 151/80 | HR 77 | Ht 72.0 in | Wt 185.0 lb

## 2019-01-15 DIAGNOSIS — I714 Abdominal aortic aneurysm, without rupture, unspecified: Secondary | ICD-10-CM

## 2019-01-15 DIAGNOSIS — I2510 Atherosclerotic heart disease of native coronary artery without angina pectoris: Secondary | ICD-10-CM

## 2019-01-15 DIAGNOSIS — E7800 Pure hypercholesterolemia, unspecified: Secondary | ICD-10-CM

## 2019-01-15 DIAGNOSIS — I951 Orthostatic hypotension: Secondary | ICD-10-CM

## 2019-01-15 MED ORDER — OXYCODONE-ACETAMINOPHEN 5-325 MG PO TABS: 1.0000 | ORAL_TABLET | Freq: Four times a day (QID) | ORAL | 0 refills | Status: DC | PRN

## 2019-01-15 NOTE — Telephone Encounter (Signed)
I called and advsied Ms. Kyser that his meds were sent to the pharm

## 2019-01-15 NOTE — Progress Notes (Deleted)
Assessment and Plan:  There are no diagnoses linked to this encounter.    Further disposition pending results of labs. Discussed med's effects and SE's.   Over 30 minutes of exam, counseling, chart review, and critical decision making was performed.   Future Appointments  Date Time Provider [place]  01/15/2019  9:40 AM Lelon Perla, MD [facility]NORTHLIN [facility]  01/15/2019 11:00 AM Garnet Sierras, NP GAAM-GAAIM None  01/22/2019  8:20 AM CVD-CHURCH DEVICE REMOTES [facility]  01/23/2019 10:30 AM Magnus Sinning, MD OC-PHY None  01/24/2019  8:30 AM [name], MD OC-GSO None  02/08/2019  9:00 AM MC-CV NL VASC 2 MC-SECVI [facility]  02/20/2019  2:00 PM [name], MD GAAM-GAAIM None  05/29/2019  3:00 PM Liane Comber, NP GAAM-GAAIM None    ------------------------------------------------------------------------------------------------------------------   HPI 85 y.o.male presents for  Past Medical History:  Diagnosis Date  . 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 Colon polyps   . Coronary atherosclerosis of unspecified type of vessel, native or graft   . Diabetes ([place])   . Difficult intubation   . Esophageal reflux   . High cholesterol   . History of IBS 02/27/2009  . Hypertension    pt denies, he says he has a h/o hypotension. If BP up he adjusts the Florinef  . Orthostatic hypotension    "BP has been dropping alot when I stand up for the last month or so" (02/17/2016)  . Vitamin B 12 deficiency   . Vitamin D deficiency      Allergies  Allergen Reactions  . Cymbalta [Duloxetine Hcl] Other (See Comments)    Dizziness  . Keflex [Cephalexin] Other (See Comments)    dizziness  . Simvastatin Other (See Comments)    Joint pain  . Sudafed [Pseudoephedrine] Other (See Comments)    Dizziness    Current Outpatient Medications on File Prior to Visit  Medication Sig  . acetaminophen (TYLENOL) 500 MG tablet Take 1,000 mg by mouth 2 (two) times daily  as needed for headache (pain).   . Ascorbic Acid (VITAMIN C) 500 MG CAPS Take 1 capsule by mouth daily.  Marland Kitchen aspirin EC 81 MG tablet Take 1 tablet (81 mg total) by mouth daily.  . butalbital-acetaminophen-caffeine (FIORICET) 50-325-40 MG tablet TAKE 1 TABLET EVERY 4 HOURS ONLY IF SEVERE HEADACHE  . Calcium Carb-Cholecalciferol (CALCIUM+D3 PO) Take 1 tablet by mouth daily.  . Cholecalciferol (VITAMIN D-3) 5000 units TABS Take 5,000 Units by mouth daily.  . Ferrous Sulfate (IRON SLOW RELEASE) 140 (45 Fe) MG TBCR Take 1 tablet by mouth daily.  . fludrocortisone (FLORINEF) 0.1 MG tablet TAKE 2 TABLETS BY MOUTH EVERY DAY (Patient taking differently: 0.05 mg daily. )  . gabapentin (NEURONTIN) 600 MG tablet Take 1/2 to 1 tablet 2 to 3 x /Daily as needed for Pain  . glucose blood test strip Check blood sugar 1 time daily-[id number]  . Magnesium 500 MG TABS Take by mouth.  . Multiple Vitamin (MULTIVITAMIN WITH MINERALS) TABS tablet Take 1 tablet by mouth daily.  Marland Kitchen olmesartan (BENICAR) 40 MG tablet Take 40 mg by mouth as needed. Take 1/2 to 1 tablet daily for blood pressure.   . Omega-3 Fatty Acids (FISH OIL OMEGA-3 PO) Take 1 capsule by mouth daily.  . pantoprazole (PROTONIX) 40 MG tablet Take 1 tablet Daily for Acid Indigestion & Reflux  . polyethylene glycol (MIRALAX / GLYCOLAX) packet Take 17 g by mouth daily. (Patient taking differently: Take 17 g by mouth 2 (two)  times daily. )  . POTASSIUM CHLORIDE PO Take by mouth daily.  . predniSONE (DELTASONE) 10 MG tablet Take 1 tablet 3 x /day as directed  . vitamin B-12 (CYANOCOBALAMIN) 1000 MCG tablet Take 1,000 mcg by mouth daily.   No current facility-administered medications on file prior to visit.     ROS: all negative except above.   Physical Exam:  There were no vitals taken for this visit.  General Appearance: Well nourished, in no apparent distress. Eyes: PERRLA, EOMs, conjunctiva no swelling or erythema Sinuses: No Frontal/maxillary  tenderness ENT/Mouth: Ext aud canals clear, TMs without erythema, bulging. No erythema, swelling, or exudate on post pharynx.  Tonsils not swollen or erythematous. Hearing normal.  Neck: Supple, thyroid normal.  Respiratory: Respiratory effort normal, BS equal bilaterally without rales, rhonchi, wheezing or stridor.  Cardio: RRR with no MRGs. Brisk peripheral pulses without edema.  Abdomen: Soft, + BS.  Non tender, no guarding, rebound, hernias, masses. Lymphatics: Non tender without lymphadenopathy.  Musculoskeletal: Full ROM, 5/5 strength, normal gait.  Skin: Warm, dry without rashes, lesions, ecchymosis.  Neuro: Cranial nerves intact. Normal muscle tone, no cerebellar symptoms. Sensation intact.  Psych: Awake and oriented X 3, normal affect, Insight and Judgment appropriate.     Garnet Sierras, NP 12:56 AM [facility]

## 2019-01-15 NOTE — Patient Instructions (Addendum)
Medication Instructions:  The current medical regimen is effective;  continue present plan and medications.  If you need a refill on your cardiac medications before your next appointment, please call your pharmacy.   Testing/Procedures: Keep appointment for your [place] in October.  Follow-Up: At [facility], you and your health needs are our priority.  As part of our continuing mission to provide you with exceptional heart care, we have created designated Provider Care Teams.  These Care Teams include your primary Cardiologist (physician) and Advanced Practice Providers (APPs -  Physician Assistants and Nurse Practitioners) who all work together to provide you with the care you need, when you need it. You will need a follow up appointment in 12 months.  Please call our office 2 months in advance to schedule this appointment.  You may see Dr.Crenshaw or one of the following Advanced Practice Providers on your designated Care Team:   Kerin Ransom, PA-C Roby Lofts, [place] . Sande Rives, PA-C

## 2019-01-15 NOTE — Telephone Encounter (Signed)
Patient called again inquiring about his RX refill on the Oxycodone.  He stated that he is a lot of pain.  I did advise him that Dr. Louanne Skye is not in the office today.

## 2019-01-17 ENCOUNTER — Other Ambulatory Visit: Payer: Self-pay | Admitting: Specialist

## 2019-01-17 ENCOUNTER — Telehealth: Payer: Self-pay | Admitting: Specialist

## 2019-01-17 NOTE — Telephone Encounter (Signed)
Patient's wife called stating that he had an appointment with Dr. Ernestina Patches on 01/23/19 and will not have enough Oxycodone to last until that date.  She is wanting to know if Dr. Louanne Skye will send in an RX for the Oxycodone and have the quantity last until then.  CB#[phone].  Thank you.

## 2019-01-18 ENCOUNTER — Telehealth: Payer: Self-pay | Admitting: Specialist

## 2019-01-18 MED ORDER — OXYCODONE-ACETAMINOPHEN 5-325 MG PO TABS: 1.0000 | ORAL_TABLET | Freq: Four times a day (QID) | ORAL | 0 refills | Status: DC | PRN

## 2019-01-18 NOTE — Telephone Encounter (Signed)
Medication refill request has been sent to Dr. Louanne Skye to approve by Alyse Low. Will hold.

## 2019-01-18 NOTE — Telephone Encounter (Signed)
Patient's wife Estill Bamberg called concerning getting the Rx (Oxycodone) sent to the pharmacy. The number to contact Estill Bamberg is [phone]

## 2019-01-18 NOTE — Telephone Encounter (Signed)
Talked with patient and advised that Rx was sent to pharmacy.

## 2019-01-18 NOTE — Telephone Encounter (Signed)
rx request has been sent to you to advise.

## 2019-01-19 ENCOUNTER — Other Ambulatory Visit (HOSPITAL_COMMUNITY): Payer: Medicare Other

## 2019-01-22 ENCOUNTER — Ambulatory Visit (INDEPENDENT_AMBULATORY_CARE_PROVIDER_SITE_OTHER): Payer: Medicare Other | Admitting: *Deleted

## 2019-01-22 DIAGNOSIS — I639 Cerebral infarction, unspecified: Secondary | ICD-10-CM

## 2019-01-22 LAB — CUP PACEART REMOTE DEVICE CHECK
Date Time Interrogation Session: 20200922100631
Implantable Pulse Generator Implant Date: 20191021

## 2019-01-23 ENCOUNTER — Ambulatory Visit: Payer: Medicare Other | Admitting: Specialist

## 2019-01-23 ENCOUNTER — Encounter: Payer: Self-pay | Admitting: Physical Medicine and Rehabilitation

## 2019-01-23 ENCOUNTER — Ambulatory Visit (HOSPITAL_COMMUNITY): Admit: 2019-01-23 | Payer: Medicare Other | Admitting: Gastroenterology

## 2019-01-23 ENCOUNTER — Encounter (HOSPITAL_COMMUNITY): Payer: Self-pay

## 2019-01-23 ENCOUNTER — Ambulatory Visit (INDEPENDENT_AMBULATORY_CARE_PROVIDER_SITE_OTHER): Payer: Medicare Other | Admitting: Physical Medicine and Rehabilitation

## 2019-01-23 ENCOUNTER — Ambulatory Visit: Payer: Medicare Other | Admitting: Physical Medicine and Rehabilitation

## 2019-01-23 VITALS — BP 185/107 | HR 66 | Ht 72.0 in | Wt 185.0 lb

## 2019-01-23 DIAGNOSIS — G8929 Other chronic pain: Secondary | ICD-10-CM

## 2019-01-23 DIAGNOSIS — M419 Scoliosis, unspecified: Secondary | ICD-10-CM

## 2019-01-23 DIAGNOSIS — M545 Low back pain, unspecified: Secondary | ICD-10-CM

## 2019-01-23 DIAGNOSIS — M5136 Other intervertebral disc degeneration, lumbar region: Secondary | ICD-10-CM

## 2019-01-23 DIAGNOSIS — M47816 Spondylosis without myelopathy or radiculopathy, lumbar region: Secondary | ICD-10-CM

## 2019-01-23 DIAGNOSIS — G894 Chronic pain syndrome: Secondary | ICD-10-CM

## 2019-01-23 DIAGNOSIS — E1342 Other specified diabetes mellitus with diabetic polyneuropathy: Secondary | ICD-10-CM

## 2019-01-23 DIAGNOSIS — M51369 Other intervertebral disc degeneration, lumbar region without mention of lumbar back pain or lower extremity pain: Secondary | ICD-10-CM

## 2019-01-23 SURGERY — COLONOSCOPY
Anesthesia: Monitor Anesthesia Care

## 2019-01-23 NOTE — Progress Notes (Signed)
 .  Numeric Pain Rating Scale and Functional Assessment Average Pain 9 Pain Right Now 9 My pain is sharp and stabbing Pain is worse with: some activites Pain improves with: medication   In the last MONTH (on 0-10 scale) has pain interfered with the following?  1. General activity like being  able to carry out your everyday physical activities such as walking, climbing stairs, carrying groceries, or moving a chair?  Rating(9)  2. Relation with others like being able to carry out your usual social activities and roles such as  activities at home, at work and in your community. Rating(10)  3. Enjoyment of life such that you have  been bothered by emotional problems such as feeling anxious, depressed or irritable?  Rating(7)

## 2019-01-24 ENCOUNTER — Ambulatory Visit (INDEPENDENT_AMBULATORY_CARE_PROVIDER_SITE_OTHER): Payer: Medicare Other | Admitting: Specialist

## 2019-01-24 ENCOUNTER — Encounter: Payer: Self-pay | Admitting: Specialist

## 2019-01-24 ENCOUNTER — Encounter: Payer: Self-pay | Admitting: Physical Medicine and Rehabilitation

## 2019-01-24 VITALS — BP 134/80 | HR 83 | Ht 72.0 in | Wt 185.0 lb

## 2019-01-24 DIAGNOSIS — M4316 Spondylolisthesis, lumbar region: Secondary | ICD-10-CM | POA: Diagnosis not present

## 2019-01-24 DIAGNOSIS — M47816 Spondylosis without myelopathy or radiculopathy, lumbar region: Secondary | ICD-10-CM

## 2019-01-24 DIAGNOSIS — M48061 Spinal stenosis, lumbar region without neurogenic claudication: Secondary | ICD-10-CM

## 2019-01-24 NOTE — Progress Notes (Addendum)
Office Visit Note   Patient: [name]           Date of Birth: 07-13-1933           MRN: [id number] Visit Date: 01/24/2019              Requested by: [name], [place],   [id number] PCP: [name], MD   Assessment & Plan: Visit Diagnoses:  1. Spondylosis without myelopathy or radiculopathy, lumbar region   2. Spinal stenosis of lumbar region without neurogenic claudication   3. Spondylolisthesis of lumbar region     Plan: Avoid bending, stooping and avoid lifting weights greater than 10 lbs. Avoid prolong standing and walking. Avoid frequent bending and stooping  No lifting greater than 10 lbs. May use ice or moist heat for pain. Weight loss is of benefit. Handicap license is approved. Dr. Romona Curls secretary/Assistant will call to arrange for Radiofrequency Abalation of the lumbar facets for arthrosis.  Hydrocodone intermittantly for pain.  Follow-Up Instructions: Return in about 6 weeks (around 03/07/2019).   Orders:  No orders of the defined types were placed in this encounter.  No orders of the defined types were placed in this encounter.     Procedures: No procedures performed   Clinical Data: No additional findings.   Subjective: Chief Complaint  Patient presents with  . Middle Back - Follow-up    Scheduled for [facility] [place] on 02/07/2019 and 02/14/2019 with Dr. Ernestina Patches    85 year old male with multilevel lumbar spondylosis. He has had 2 injection series by Dr. Ernestina Patches and is set to consider a RFA this October. 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 Night pain has him taking hydrocodone at that time to relieve his pain. No bowel or bladder difficulty. He uses a walker for ambulation. He requires the narcotics as he is on ASA and unable to tolerate NSAIDs. He has a history of GI bleeding and he had surgery 4-5 months ago with bleeding ulcer.    Review of Systems  Constitutional:  Positive for activity change. Negative for appetite change, chills, diaphoresis, fatigue, fever and unexpected weight change.  HENT: Negative.  Negative for congestion, dental problem, drooling, ear discharge, ear pain, facial swelling, hearing loss, mouth sores, nosebleeds, postnasal drip, rhinorrhea, sinus pressure, sinus pain, sneezing, sore throat, tinnitus, trouble swallowing and voice change.   Eyes: Positive for visual disturbance (post strokes his eyes do not fully focus.). Negative for photophobia, pain, discharge, redness and itching.  Respiratory: Negative.  Negative for apnea, cough, choking, chest tightness, shortness of breath, wheezing and stridor.   Cardiovascular: Negative.  Negative for chest pain, palpitations and leg swelling.  Gastrointestinal: Negative for abdominal distention, abdominal pain, anal bleeding, blood in stool, constipation, diarrhea, nausea, rectal pain and vomiting.  Endocrine: Negative for cold intolerance, heat intolerance, polydipsia, polyphagia and polyuria.  Genitourinary: Negative.  Negative for difficulty urinating, dysuria, enuresis, flank pain, frequency, genital sores and urgency.  Musculoskeletal: Positive for back pain and gait problem. Negative for arthralgias, joint swelling, myalgias, neck pain and neck stiffness.  Skin: Negative for color change, pallor, rash and wound.  Allergic/Immunologic: Negative for environmental allergies, food allergies and immunocompromised state.  Neurological: Negative for dizziness, tremors, seizures, syncope, facial asymmetry, speech difficulty, weakness, light-headedness, numbness and headaches.  Psychiatric/Behavioral: Negative.  Negative for agitation, behavioral problems, confusion, decreased concentration, dysphoric mood, hallucinations, self-injury, sleep disturbance and suicidal ideas. The patient is not nervous/anxious and  is not hyperactive.      Objective: Vital Signs: BP 134/80 (BP Location: Left Arm,  Patient Position: Sitting)   Pulse 83   Ht 6' (1.829 m)   Wt 185 lb (83.9 kg)   BMI 25.09 kg/m   Physical Exam Constitutional:      Appearance: He is well-developed.  HENT:     Head: Normocephalic and atraumatic.  Eyes:     Pupils: Pupils are equal, round, and reactive to light.  Neck:     Musculoskeletal: Normal range of motion and neck supple.  Pulmonary:     Effort: Pulmonary effort is normal.     Breath sounds: Normal breath sounds.  Abdominal:     General: Bowel sounds are normal.     Palpations: Abdomen is soft.  Skin:    General: Skin is warm and dry.  Neurological:     Mental Status: He is alert and oriented to person, place, and time.  Psychiatric:        Behavior: Behavior normal.        Thought Content: Thought content normal.        Judgment: Judgment normal.     Back Exam   Tenderness  The patient is experiencing tenderness in the lumbar.  Range of Motion  Extension: abnormal  Flexion: normal  Lateral bend right: abnormal  Lateral bend left: abnormal  Rotation right: abnormal  Rotation left: abnormal   Muscle Strength  Right Quadriceps:  4/5  Left Quadriceps:  4/5  Right Hamstrings:  5/5  Left Hamstrings:  5/5   Tests  Straight leg raise right: negative Straight leg raise left: negative  Reflexes  Patellar: 1/4 Achilles: 1/4 Babinski's sign: normal   Other  Toe walk: normal Heel walk: normal Sensation: normal Gait: abnormal  Erythema: no back redness Scars: absent      Specialty Comments:  No specialty comments available.  Imaging: No results found.   PMFS History: Patient Active Problem List   Diagnosis Date Noted  . Chronic bilateral low back pain without sciatica 02/07/2019  . Statin intolerance 11/16/2018  . 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 Hyperlipidemia, mixed 08/15/2018  . Low back pain 07/18/2018  . Acute blood loss anemia 06/24/2018  . Upper GI bleed 06/24/2018  . History of lacunar cerebrovascular  accident (CVA) 02/12/2018  . Dizziness 02/12/2018  . Myofascial pain 12/18/2017  . Spondylosis without myelopathy or radiculopathy, lumbar region 12/18/2017  . Lumbar radiculopathy, right 12/18/2017  . Cholelithiasis 05/17/2017  . Sinus Bradycardia 05/17/2017  . Thoracic radiculopathy 04/12/2017  . Primary osteoarthritis of right knee 12/14/2016  . DDD (degenerative disc disease), lumbar 10/10/2016  . Lumbar facet arthropathy 08/29/2016  . Idiopathic scoliosis 03/22/2016  . Diabetic neuropathy ([place]) 03/22/2016  . Dysautonomia orthostatic hypotension syndrome ([place]) 02/25/2016  . CKD stage 3 due to type 2 diabetes mellitus ([place]) 02/19/2016  . Coronary artery disease involving coronary bypass graft of native heart without angina pectoris   . Diabetes mellitus type 2, diet-controlled ([place])   . 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 Medication management 08/11/2015  . 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 PVD (peripheral vascular disease) ([place]) 01/01/2014  . Hyperlipidemia associated with type 2 diabetes mellitus ([place]) 04/11/2013  . Supine hypertension   . Vitamin D deficiency   . Vitamin B 12 deficiency   . Coronary atherosclerosis- s/p PCI to LAD in 2009 and PCI to RCA in 2011 02/27/2009  . Abdominal aortic aneurysm ([place]) 02/27/2009  . GERD 02/27/2009  Past Medical History:  Diagnosis Date  . 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 Colon polyps   . Coronary atherosclerosis of unspecified type of vessel, native or graft   . Diabetes ([place])   . Difficult intubation   . Esophageal reflux   . High cholesterol   . History of IBS 02/27/2009  . Hypertension    pt denies, he says he has a h/o hypotension. If BP up he adjusts the Florinef  . Orthostatic hypotension    "BP has been dropping alot when I stand up for the last month or so" (02/17/2016)  . Vitamin B 12 deficiency   . Vitamin D deficiency     Family History  Problem Relation Age of Onset  . Heart attack Father        died age 37  .  Heart attack Brother        died age 85  . Anuerysm Brother        aortic  . Heart attack Sister        died age 31  . Colon cancer Sister   . Liver cancer Sister   . Diabetes Maternal Grandmother   . Arthritis Mother   . Dementia Mother   . Heart Problems Brother   . Heart Problems Brother   . Heart Problems Brother   . Heart Problems Brother   . Healthy Daughter   . Healthy Son   . Esophageal cancer Neg Hx   . Inflammatory bowel disease Neg Hx   . Pancreatic cancer Neg Hx   . Stomach cancer Neg Hx     Past Surgical History:  Procedure Laterality Date  . BIOPSY  06/25/2018   Procedure: BIOPSY;  Surgeon: Rush Landmark Telford Nab., MD;  Location: [facility];  Service: Gastroenterology;;  . BLADDER SURGERY  1969   traumatic pelvic fractures, urethral and bladder repair  . CARDIAC CATHETERIZATION N/A 07/01/2015   Procedure: Left Heart Cath and Coronary Angiography;  Surgeon: Wellington Hampshire, MD;  Location: [facility];  Service: Cardiovascular;  Laterality: N/A;  . COLON RESECTION N/A 05/17/2017   Procedure: DIAGNOSTIC LAPAROSCOPY,;  Surgeon: Leighton Ruff, MD;  Location: [facility];  Service: General;  Laterality: N/A;  . CORONARY ANGIOPLASTY WITH STENT PLACEMENT    . CORONARY ARTERY BYPASS GRAFT N/A 07/06/2015   Procedure: CORONARY ARTERY BYPASS GRAFTING (CABG)x 4   utilizing the left internal mammary artery and endoscopically harvested bilateral  sapheneous vein.;  Surgeon: Ivin Poot, MD;  Location: [facility];  Service: Open Heart Surgery;  Laterality: N/A;  . ESOPHAGOGASTRODUODENOSCOPY (EGD) WITH PROPOFOL N/A 06/25/2018   Procedure: ESOPHAGOGASTRODUODENOSCOPY (EGD) WITH PROPOFOL;  Surgeon: Rush Landmark Telford Nab., MD;  Location: [facility];  Service: Gastroenterology;  Laterality: N/A;  . KNEE SURGERY    . LOOP RECORDER INSERTION N/A 02/19/2018   Procedure: LOOP RECORDER INSERTION;  Surgeon: Deboraha Sprang, MD;  Location: [facility];  Service: Cardiovascular;   Laterality: N/A;  . TEE WITHOUT CARDIOVERSION N/A 07/06/2015   Procedure: TRANSESOPHAGEAL ECHOCARDIOGRAM (TEE);  Surgeon: Ivin Poot, MD;  Location: [facility];  Service: Open Heart Surgery;  Laterality: N/A;   Social History   Occupational History  . Occupation: Retired  Tobacco Use  . Smoking status: Former Smoker    Quit date: 07/16/1963    Years since quitting: 55.6  . Smokeless tobacco: Former Systems developer    Types: Chew    Quit date: 1989  Substance and Sexual Activity  . Alcohol  use: No  . Drug use: No  . Sexual activity: Not Currently

## 2019-01-24 NOTE — Progress Notes (Signed)
[name] - 85 y.o. male MRN [id number]  Date of birth: 18-Jan-1934  Office Visit Note: Visit Date: 01/23/2019 PCP: [name], MD Referred by: [name], MD  Subjective: Chief Complaint  Patient presents with  . Middle Back - Pain  . Lower Back - Pain   HPI: [name] is a 85 y.o. male who comes in today At the request of Dr. Basil Dess for evaluation management of chronic worsening severe axial low back pain with history of pain in the legs and polyneuropathy.  I have seen the patient on a couple of occasions through Dr. Donavan Burnet for epidural injections which were really not beneficial at all.  He describes this pain is in the middle of the lower back worse with standing and walking.  He rates his pain as a 9 out of 10 which is sharp and stabbing.  He does get some relief with medications.  He does take oxycodone.  He has trouble walking does ambulate with a walker.  His case is complicated by diabetic polyneuropathy in the legs.  He has had imaging of the lumbar spine which is reviewed again below and reviewed with the patient.  This shows facet arthropathy at L3-4 and L4-5 and L5-S1 with some scoliotic change and some level of foraminal and lateral recess narrowing but no high-grade central stenosis.  The patient's not had any prior lumbar surgery.  He has tried and failed other medications.  He has had physical therapy in the past and tries to stay active but he is really limited what he can do.  He has had a history of several strokes as well as vascular disease with several heart stents.  He is not on any anticoagulation at this point.  Home exercise routine was instituted through Dr. Merrilee Seashore and his primary care physician Dr. Janit Pagan.  He denies any focal weakness but feels weak overall.  He has not had any recent falls.  No recent trauma.  He has had no red flag complaints of bowel bladder changes or specific night pain or unintended weight loss.  Review of Systems   Constitutional: Negative for chills, fever, malaise/fatigue and weight loss.  HENT: Negative for hearing loss and sinus pain.   Eyes: Negative for blurred vision, double vision and photophobia.  Respiratory: Negative for cough and shortness of breath.   Cardiovascular: Negative for chest pain, palpitations and leg swelling.  Gastrointestinal: Negative for abdominal pain, nausea and vomiting.  Genitourinary: Negative for flank pain.  Musculoskeletal: Positive for back pain. Negative for myalgias.  Skin: Negative for itching and rash.  Neurological: Positive for tingling and weakness. Negative for tremors and focal weakness.  Endo/Heme/Allergies: Negative.   Psychiatric/Behavioral: Negative for depression.  All other systems reviewed and are negative.  Otherwise per HPI.  Assessment & Plan: Visit Diagnoses:  1. Spondylosis without myelopathy or radiculopathy, lumbar region   2. Scoliosis of thoracolumbar spine, unspecified scoliosis type   3. Chronic bilateral low back pain without sciatica   4. Diabetic polyneuropathy associated with other specified diabetes mellitus ([place])   5. DDD (degenerative disc disease), lumbar   6. Chronic pain syndrome     Plan: Findings:  Chronic severe persistent and recalcitrant axial low back pain which is a combination of facet mediated low back pain and just pretty significant degenerative change overall.  He has not really had great relief with epidural injections through our office and in the past.  His leg pain and paresthesia is clearly from  his neuropathy.  He has no real changes in the spine that would give you a high-grade bilateral symmetric radiculopathy although obviously cannot totally rule that out.  He has not had recent electrodiagnostic study for the legs but this is been an ongoing diagnosis.  He currently takes oxycodone with only mild relief and he rates his pain as a 9 out of 10 and it really limits what he can do.  Medical complicating  factors as above.  I think the next best approach is diagnostic medial branch blocks of the lower facet joints L3-4 L4-5 and L5-S1.  We went over this at length with him speaking with him for probably 30 minutes about the reason for the diagnostic blocks and the potential for ablation which could last longer definitively.  He meets all the qualifications for this if it seems to help.  He does seem to have some memory difficulties we did give him some information on ablation to give to his wife.  He is probably not a great candidate for spinal cord stimulator given all the prior medical issues and the memory loss issues.  We will see him back for diagnostic medial branch blocks.  He will continue with current medications and home exercise plan.    Meds & Orders: No orders of the defined types were placed in this encounter.  No orders of the defined types were placed in this encounter.   Follow-up: Return for Diagnostic medial branch blocks of the lower facet.   Procedures: No procedures performed  No notes on file   Clinical History: MRI LUMBAR SPINE WITHOUT CONTRAST  TECHNIQUE: Multiplanar, multisequence MR imaging of the lumbar spine was performed. No intravenous contrast was administered.  COMPARISON:  09/13/2016  FINDINGS: Segmentation:  5 lumbar type vertebrae  Alignment:  Dextroscoliosis.  Straightening of the lumbar spine.  Vertebrae:  No fracture, evidence of discitis, or bone lesion.  Conus medullaris and cauda equina: Conus extends to the L1-2 level. Conus and cauda equina appear normal.  Paraspinal and other soft tissues: Aortic and iliac aneurysmal enlargement. The abdominal aorta measures up to 3.3 cm where visualized and left common iliac artery measures up to 2.9 cm  Disc levels:  T12- L1: Spondylosis.  No impingement  L1-L2: Spondylosis.  No impingement  L2-L3: Spondylosis with disc narrowing and mild bulging. No impingement  L3-L4: Severe  degenerative disc disease with left-sided collapse and asymmetric spurring. There is moderate left foraminal stenosis. Left subarticular recess narrowing without static L4 compression  L4-L5: Asymmetric leftward degenerative disc collapse with bulge and endplate spurring. Bilateral subarticular recess stenosis with apparent flattening of the left L5 nerve root. There is moderate left more than right foraminal narrowing  L5-S1:Spondylosis and disc degeneration with moderate borderline advanced right foraminal stenosis. The canal is patent  IMPRESSION: 1. Advanced degenerative disease with scoliosis. 2. L3-4 moderate left foraminal narrowing. 3. L4-5 left subarticular recess stenosis and L5 impingement. Moderate bilateral foraminal narrowing. 4. L5-S1 moderate right foraminal narrowing. 5. Aortic and iliac aneurysms without apparent change from 2019 abdominal CT.   Electronically Signed   By: Monte Fantasia M.D.   On: 08/09/2018 09:26   He reports that he quit smoking about 55 years ago. He quit smokeless tobacco use about 31 years ago.  His smokeless tobacco use included chew.  Recent Labs    01/30/18 1454 05/24/18 1617 08/15/18 1117  HGBA1C 6.2* 6.3* 5.7*    Objective:  VS:  HT:6' (182.9 cm)   WT:185 lb (83.9  kg)  BMI:25.08    BP:(!) 185/107  HR:66bpm  TEMP: ( )  RESP:  Physical Exam Vitals signs and nursing note reviewed.  Constitutional:      General: He is not in acute distress.    Appearance: He is well-developed.  HENT:     Head: Normocephalic and atraumatic.     Nose: Nose normal.     Mouth/Throat:     Mouth: Mucous membranes are moist.     Pharynx: Oropharynx is clear.  Eyes:     Conjunctiva/sclera: Conjunctivae normal.     Pupils: Pupils are equal, round, and reactive to light.  Neck:     Musculoskeletal: Normal range of motion and neck supple.     Trachea: No tracheal deviation.  Cardiovascular:     Rate and Rhythm: Normal rate and regular  rhythm.     Pulses: Normal pulses.  Pulmonary:     Effort: Pulmonary effort is normal.     Breath sounds: Normal breath sounds.  Abdominal:     General: There is no distension.     Palpations: Abdomen is soft.     Tenderness: There is no guarding or rebound.  Musculoskeletal:        General: No deformity.     Right lower leg: No edema.     Left lower leg: No edema.     Comments: Patient ambulates with aid of a walker.  He does stand with a forward flexed lumbar spine with some scoliotic deformity but not drastic.  He has pain with facet loading and extension of the lumbar spine.  No focal trigger points.  Somewhat tender over the greater trochanters no pain with hip rotation he has good distal strength overall without clonus.  Skin:    General: Skin is warm and dry.     Findings: No erythema or rash.  Neurological:     General: No focal deficit present.     Mental Status: He is alert and oriented to person, place, and time.     Motor: No abnormal muscle tone.     Coordination: Coordination normal.     Gait: Gait normal.  Psychiatric:        Mood and Affect: Mood normal.        Behavior: Behavior normal.        Thought Content: Thought content normal.     Ortho Exam Imaging: No results found.  Past Medical/Family/Surgical/Social History: Medications & Allergies reviewed per EMR, new medications updated. Patient Active Problem List   Diagnosis Date Noted  . Statin intolerance 11/16/2018  . 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 Hyperlipidemia, mixed 08/15/2018  . Low back pain 07/18/2018  . Acute blood loss anemia 06/24/2018  . Upper GI bleed 06/24/2018  . History of lacunar cerebrovascular accident (CVA) 02/12/2018  . Dizziness 02/12/2018  . Myofascial pain 12/18/2017  . Lumbar radiculopathy, right 12/18/2017  . Cholelithiasis 05/17/2017  . Sinus Bradycardia 05/17/2017  . Thoracic radiculopathy 04/12/2017  . Primary osteoarthritis of right knee 12/14/2016  . DDD  (degenerative disc disease), lumbar 10/10/2016  . Lumbar facet arthropathy 08/29/2016  . Idiopathic scoliosis 03/22/2016  . Diabetic neuropathy ([place]) 03/22/2016  . Dysautonomia orthostatic hypotension syndrome ([place]) 02/25/2016  . CKD stage 3 due to type 2 diabetes mellitus ([place]) 02/19/2016  . Coronary artery disease involving coronary bypass graft of native heart without angina pectoris   . Diabetes mellitus type 2, diet-controlled ([place])   . 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 Medication management 08/11/2015  .  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 PVD (peripheral vascular disease) ([place]) 01/01/2014  . Hyperlipidemia associated with type 2 diabetes mellitus ([place]) 04/11/2013  . Supine hypertension   . Vitamin D deficiency   . Vitamin B 12 deficiency   . Coronary atherosclerosis- s/p PCI to LAD in 2009 and PCI to RCA in 2011 02/27/2009  . Abdominal aortic aneurysm ([place]) 02/27/2009  . GERD 02/27/2009   Past Medical History:  Diagnosis Date  . 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 Colon polyps   . Coronary atherosclerosis of unspecified type of vessel, native or graft   . Diabetes ([place])   . Difficult intubation   . Esophageal reflux   . High cholesterol   . History of IBS 02/27/2009  . Hypertension    pt denies, he says he has a h/o hypotension. If BP up he adjusts the [place]  . Orthostatic hypotension    "BP has been dropping alot when I stand up for the last month or so" (02/17/2016)  . Vitamin B 12 deficiency   . Vitamin D deficiency    Family History  Problem Relation Age of Onset  . Heart attack Father        died age 35  . Heart attack Brother        died age 6  . Anuerysm Brother        aortic  . Heart attack Sister        died age 61  . Colon cancer Sister   . Liver cancer Sister   . Diabetes Maternal Grandmother   . Colon polyps Sister        and brothers x 2   . Esophageal cancer Neg Hx   . Inflammatory bowel disease Neg Hx   . Pancreatic cancer Neg Hx   . Stomach  cancer Neg Hx    Past Surgical History:  Procedure Laterality Date  . BIOPSY  06/25/2018   Procedure: BIOPSY;  Surgeon: Rush Landmark Telford Nab., MD;  Location: [facility];  Service: Gastroenterology;;  . BLADDER SURGERY  1969   traumatic pelvic fractures, urethral and bladder repair  . CARDIAC CATHETERIZATION N/A 07/01/2015   Procedure: Left Heart Cath and Coronary Angiography;  Surgeon: Wellington Hampshire, MD;  Location: [facility];  Service: Cardiovascular;  Laterality: N/A;  . COLON RESECTION N/A 05/17/2017   Procedure: DIAGNOSTIC LAPAROSCOPY,;  Surgeon: Leighton Ruff, MD;  Location: [facility];  Service: General;  Laterality: N/A;  . CORONARY ANGIOPLASTY WITH STENT PLACEMENT    . CORONARY ARTERY BYPASS GRAFT N/A 07/06/2015   Procedure: CORONARY ARTERY BYPASS GRAFTING (CABG)x 4   utilizing the left internal mammary artery and endoscopically harvested bilateral  sapheneous vein.;  Surgeon: Ivin Poot, MD;  Location: [facility];  Service: Open Heart Surgery;  Laterality: N/A;  . ESOPHAGOGASTRODUODENOSCOPY (EGD) WITH PROPOFOL N/A 06/25/2018   Procedure: ESOPHAGOGASTRODUODENOSCOPY (EGD) WITH PROPOFOL;  Surgeon: Rush Landmark Telford Nab., MD;  Location: [facility];  Service: Gastroenterology;  Laterality: N/A;  . KNEE SURGERY    . LOOP RECORDER INSERTION N/A 02/19/2018   Procedure: LOOP RECORDER INSERTION;  Surgeon: Deboraha Sprang, MD;  Location: [facility];  Service: Cardiovascular;  Laterality: N/A;  . TEE WITHOUT CARDIOVERSION N/A 07/06/2015   Procedure: TRANSESOPHAGEAL ECHOCARDIOGRAM (TEE);  Surgeon: Ivin Poot, MD;  Location: [facility];  Service: Open Heart Surgery;  Laterality: N/A;   Social History   Occupational History  . Occupation: Retired  Tobacco Use  .  Smoking status: Former Smoker    Quit date: 07/16/1963    Years since quitting: 55.5  . Smokeless tobacco: Former Systems developer    Types: Chew    Quit date: 1989  Substance and Sexual Activity  . Alcohol use: No  . Drug use:  No  . Sexual activity: Not Currently

## 2019-01-24 NOTE — Progress Notes (Signed)
Office Visit Note  Patient: [name]             Date of Birth: 07-30-1933           MRN: [id number]             PCP: [name], MD Referring: [name], MD Visit Date: 01/30/2019 Occupation: @GUAROCC @  Subjective:  Severe thoracic and lumbar pain.   History of Present Illness: [name] is a 85 y.o. male with history of degenerative disc disease involving his thoracic and lumbar spine.  He has been under care of Dr. Louanne Skye for many years.  He also gets intermittent injections by Dr. Ernestina Patches.  He states he has constant pain in his thoracic and lumbar region.  He has been taking gabapentin and oxycodone for pain management.  He also has some discomfort in his shoulders.  He has pain in his bilateral knee joints which have end-stage osteoarthritis based on the x-rays done in February.  Activities of Daily Living:  Patient reports morning stiffness for 0 minute.   Patient Reports nocturnal pain.  Difficulty dressing/grooming: Reports Difficulty climbing stairs: Reports Difficulty getting out of chair: Reports Difficulty using hands for taps, buttons, cutlery, and/or writing: Denies  Review of Systems  Constitutional: Positive for fatigue. Negative for night sweats.  HENT: Negative for mouth sores, mouth dryness and nose dryness.   Eyes: Negative for redness and dryness.  Respiratory: Negative for shortness of breath and difficulty breathing.   Cardiovascular: Negative for chest pain, palpitations, hypertension, irregular heartbeat and swelling in legs/feet.  Gastrointestinal: Positive for constipation. Negative for diarrhea.  Endocrine: Negative for increased urination.  Musculoskeletal: Positive for arthralgias and joint pain. Negative for joint swelling, myalgias, muscle weakness, morning stiffness, muscle tenderness and myalgias.  Skin: Negative for color change, rash, hair loss, nodules/bumps, skin tightness, ulcers and sensitivity to sunlight.   Allergic/Immunologic: Negative for susceptible to infections.  Neurological: Negative for dizziness, fainting, memory loss, night sweats and weakness ( ).  Hematological: Negative for swollen glands.  Psychiatric/Behavioral: Positive for sleep disturbance. Negative for depressed mood. The patient is not nervous/anxious.     PMFS History:  Patient Active Problem List   Diagnosis Date Noted  . Statin intolerance 11/16/2018  . 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 Hyperlipidemia, mixed 08/15/2018  . Low back pain 07/18/2018  . Acute blood loss anemia 06/24/2018  . Upper GI bleed 06/24/2018  . History of lacunar cerebrovascular accident (CVA) 02/12/2018  . Dizziness 02/12/2018  . Myofascial pain 12/18/2017  . Lumbar radiculopathy, right 12/18/2017  . Cholelithiasis 05/17/2017  . Sinus Bradycardia 05/17/2017  . Thoracic radiculopathy 04/12/2017  . Primary osteoarthritis of right knee 12/14/2016  . DDD (degenerative disc disease), lumbar 10/10/2016  . Lumbar facet arthropathy 08/29/2016  . Idiopathic scoliosis 03/22/2016  . Diabetic neuropathy ([place]) 03/22/2016  . Dysautonomia orthostatic hypotension syndrome ([place]) 02/25/2016  . CKD stage 3 due to type 2 diabetes mellitus ([place]) 02/19/2016  . Coronary artery disease involving coronary bypass graft of native heart without angina pectoris   . Diabetes mellitus type 2, diet-controlled ([place])   . 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 Medication management 08/11/2015  . 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 PVD (peripheral vascular disease) ([place]) 01/01/2014  . Hyperlipidemia associated with type 2 diabetes mellitus ([place]) 04/11/2013  . Supine hypertension   . Vitamin D deficiency   . Vitamin B 12 deficiency   . Coronary atherosclerosis- s/p PCI to LAD in 2009 and PCI  to RCA in 2011 02/27/2009  . Abdominal aortic aneurysm ([place]) 02/27/2009  . GERD 02/27/2009    Past Medical History:  Diagnosis Date  . 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 Colon polyps    . Coronary atherosclerosis of unspecified type of vessel, native or graft   . Diabetes ([place])   . Difficult intubation   . Esophageal reflux   . High cholesterol   . History of IBS 02/27/2009  . Hypertension    pt denies, he says he has a h/o hypotension. If BP up he adjusts the [place]  . Orthostatic hypotension    "BP has been dropping alot when I stand up for the last month or so" (02/17/2016)  . Vitamin B 12 deficiency   . Vitamin D deficiency     Family History  Problem Relation Age of Onset  . Heart attack Father        died age 28  . Heart attack Brother        died age 58  . Anuerysm Brother        aortic  . Heart attack Sister        died age 79  . Colon cancer Sister   . Liver cancer Sister   . Diabetes Maternal Grandmother   . Arthritis Mother   . Dementia Mother   . Heart Problems Brother   . Heart Problems Brother   . Heart Problems Brother   . Heart Problems Brother   . Healthy Daughter   . Healthy Son   . Esophageal cancer Neg Hx   . Inflammatory bowel disease Neg Hx   . Pancreatic cancer Neg Hx   . Stomach cancer Neg Hx    Past Surgical History:  Procedure Laterality Date  . BIOPSY  06/25/2018   Procedure: BIOPSY;  Surgeon: Rush Landmark Telford Nab., MD;  Location: [facility];  Service: Gastroenterology;;  . BLADDER SURGERY  1969   traumatic pelvic fractures, urethral and bladder repair  . CARDIAC CATHETERIZATION N/A 07/01/2015   Procedure: Left Heart Cath and Coronary Angiography;  Surgeon: Wellington Hampshire, MD;  Location: [facility];  Service: Cardiovascular;  Laterality: N/A;  . COLON RESECTION N/A 05/17/2017   Procedure: DIAGNOSTIC LAPAROSCOPY,;  Surgeon: Leighton Ruff, MD;  Location: [facility];  Service: General;  Laterality: N/A;  . CORONARY ANGIOPLASTY WITH STENT PLACEMENT    . CORONARY ARTERY BYPASS GRAFT N/A 07/06/2015   Procedure: CORONARY ARTERY BYPASS GRAFTING (CABG)x 4   utilizing the left internal mammary artery and endoscopically  harvested bilateral  sapheneous vein.;  Surgeon: Ivin Poot, MD;  Location: [facility];  Service: Open Heart Surgery;  Laterality: N/A;  . ESOPHAGOGASTRODUODENOSCOPY (EGD) WITH PROPOFOL N/A 06/25/2018   Procedure: ESOPHAGOGASTRODUODENOSCOPY (EGD) WITH PROPOFOL;  Surgeon: Rush Landmark Telford Nab., MD;  Location: [facility];  Service: Gastroenterology;  Laterality: N/A;  . KNEE SURGERY    . LOOP RECORDER INSERTION N/A 02/19/2018   Procedure: LOOP RECORDER INSERTION;  Surgeon: Deboraha Sprang, MD;  Location: [facility];  Service: Cardiovascular;  Laterality: N/A;  . TEE WITHOUT CARDIOVERSION N/A 07/06/2015   Procedure: TRANSESOPHAGEAL ECHOCARDIOGRAM (TEE);  Surgeon: Ivin Poot, MD;  Location: [facility];  Service: Open Heart Surgery;  Laterality: N/A;   Social History   Social History Narrative   Lives at home with his wife Estill Bamberg   Right handed   No caffeine   Immunization History  Administered Date(s) Administered  . Influenza, High Dose Seasonal PF  12/30/2013, 02/02/2015, 01/27/2016  . Influenza-Unspecified 01/13/2013, 01/29/2017, 01/30/2018  . Pneumococcal Conjugate-13 02/14/2014  . Pneumococcal-Unspecified 06/15/2004  . Tetanus 10/01/2012  . Zoster 10/01/2012     Objective: Vital Signs: BP (!) 157/88 (BP Location: Right Arm, Patient Position: Sitting, Cuff Size: Normal)   Pulse 78   Resp 15   Ht 5\' 11"  (1.803 m)   Wt 191 lb 3.2 oz (86.7 kg)   BMI 26.67 kg/m    Physical Exam Vitals signs and nursing note reviewed.  Constitutional:      Appearance: He is well-developed.  HENT:     Head: Normocephalic and atraumatic.  Eyes:     Conjunctiva/sclera: Conjunctivae normal.     Pupils: Pupils are equal, round, and reactive to light.  Neck:     Musculoskeletal: Normal range of motion and neck supple.  Cardiovascular:     Rate and Rhythm: Normal rate and regular rhythm.     Heart sounds: Normal heart sounds.  Pulmonary:     Effort: Pulmonary effort is normal.      Breath sounds: Normal breath sounds.  Abdominal:     General: Bowel sounds are normal.     Palpations: Abdomen is soft.  Skin:    General: Skin is warm and dry.     Capillary Refill: Capillary refill takes less than 2 seconds.  Neurological:     Mental Status: He is alert and oriented to person, place, and time.  Psychiatric:        Behavior: Behavior normal.      Musculoskeletal Exam: Patient has some limitation range of motion of his cervical spine.  He has very limited range of motion of his thoracic and lumbar spine.  He is thoracolumbar scoliosis.  He has thoracic kyphosis.  He has tenderness on palpation over mid thoracic region and also in the lower lumbar region.  No SI joint tenderness was elicited.  Shoulder joints were in good range of motion without much discomfort.  Elbow joints wrist joint MCPs PIPs DIPs with good range of motion.  Hip joints with good range of motion.  He had no warmth swelling or effusion in his knee joints.  Ankle joints MTPs PIPs with good range of motion.  CDAI Exam: CDAI Score: - Patient Global: -; Provider Global: - Swollen: -; Tender: - Joint Exam   No joint exam has been documented for this visit   There is currently no information documented on the homunculus. Go to the Rheumatology activity and complete the homunculus joint exam.  Investigation: No additional findings.  Imaging: Mr Thoracic Spine W/o Contrast  Result Date: 01/04/2019 CLINICAL DATA:  Acute pain due to trauma. Additional history: Left T5 or T6 radicular pain, bone scan negative, thoracolumbar scoliosis. Recent accident but nuclear medicine scan negative, assess for left T5 or T6 nerve compression. EXAM: MRI THORACIC SPINE WITHOUT CONTRAST TECHNIQUE: Multiplanar, multisequence MR imaging of the thoracic spine was performed. No intravenous contrast was administered. COMPARISON:  Nuclear medicine bone scan 12/20/2018, radiographs of the thoracic spine 11/29/2018, cervical spine MRI  02/23/2018, MRI thoracic spine 04/15/2017 FINDINGS: Alignment: Slightly exaggerated thoracic kyphosis. No significant spondylolisthesis. Vertebrae: Redemonstrated mild chronic T2 superior endplate compression fracture with fatty degenerative marrow signal along the superior endplate. Vertebral body height is otherwise maintained. No marrow edema is demonstrated to suggest recent fracture. Hemangioma within the T9 vertebral body. Cord: No spinal cord signal abnormality. Paraspinal and other soft tissues: 7 mm benign-appearing cystic cutaneous/subcutaneous lesion dorsally on the right at the T7  level. The imaged paraspinal soft tissues are otherwise unremarkable. Ectatic visualized ascending and descending thoracic aorta measuring up to 3.2 cm in diameter with atherosclerosis. Cholecystolithiasis. Disc levels: Mild multilevel disc degeneration. There is no significant focal disc herniation, spinal canal stenosis or neural foraminal within the thoracic spine. Multilevel ventrolateral osteophytes greatest at the T9-T10 and T10-T11 levels. Cervical spondylosis is incompletely assessed on localizer imaging. Redemonstrated C5-C6 disc bulge contributing to mild/moderate spinal canal stenosis. IMPRESSION: Redemonstrated mild chronic T2 superior endplate compression deformity. No new compression deformity or marrow edema. No significant focal disc herniation, spinal canal stenosis or neural foraminal narrowing within the thoracic spine. Ectatic visualized ascending and descending thoracic aorta measuring up to 3.2 cm in diameter. Aortic atherosclerosis. Cholecystolithiasis. Electronically Signed   By: Kellie Simmering   On: 01/04/2019 09:13    Recent Labs: Lab Results  Component Value Date   WBC 9.2 09/27/2018   HGB 12.7 (L) 09/27/2018   PLT 225 09/27/2018   NA 144 09/27/2018   K 4.9 09/27/2018   CL 103 09/27/2018   CO2 29 09/27/2018   GLUCOSE 213 (H) 09/27/2018   BUN 19 09/27/2018   CREATININE 1.50 (H) 09/27/2018    BILITOT 0.3 09/27/2018   ALKPHOS 46 06/24/2018   AST 15 09/27/2018   ALT 10 09/27/2018   PROT 6.5 11/29/2018   ALBUMIN 3.0 (L) 06/24/2018   CALCIUM 9.5 09/27/2018   GFRAA 49 (L) 09/27/2018    Speciality Comments: No specialty comments available.  Procedures:  No procedures performed Allergies: Cymbalta [duloxetine hcl], Keflex [cephalexin], Simvastatin, and Sudafed [pseudoephedrine]   Assessment / Plan:     Visit Diagnoses: Pain in thoracic spine - sed rate 2 on 11/29/18.  Patient has severe thoracic pain.  I reviewed his x-rays of the thoracic spine from November 29, 2018 showed severe kyphosis vertebral height loss and osteophytes consistent with degenerative disc disease and most likely osteoporosis.  We will schedule DEXA scan.  Lumbar pain-he has severe lower back pain despite being on gabapentin and and oxycodone with constant pain.  I also reviewed his lumbar spine x-rays which showed severe degenerative disc disease and scoliosis.  I discussed use of Cymbalta for pain management.  As despite taking gabapentin and narcotics he is not getting enough pain relief.  Indications side effects contraindications were discussed.  We will try low-dose Cymbalta to see how he tolerates that.  Thoracic spondylosis-he has severe degenerative disc disease of thoracic spine.  Spondylosis without myelopathy or radiculopathy, lumbar region-he has severe degenerative disease of lumbar spine.  Primary osteoarthritis of both shoulders-he has fairly good range of motion of the shoulder joints.  Osteoarthritis bilateral knee joint-severe end-stage osteoarthritis of knee joints.  He has difficulty with mobility and uses a walker.  I will schedule DEXA scan to evaluate his bone mass density.  He has multiple medical problems are listed as follows.  S/P CABG x 4  PVD (peripheral vascular disease) (HCC)  Myofascial pain  Hyperlipidemia associated with type 2 diabetes mellitus ([place])  History of  lacunar cerebrovascular accident (CVA)  Dysautonomia orthostatic hypotension syndrome (HCC)  Gastroesophageal reflux disease without esophagitis  CKD stage 3 due to type 2 diabetes mellitus ([place])  Abdominal aortic aneurysm (AAA) without rupture ([place])  Orders: Orders Placed This Encounter  Procedures  . DG BONE DENSITY (DXA)   Meds ordered this encounter  Medications  . DULoxetine (CYMBALTA) 20 MG capsule    Sig: Take 1 capsule (20 mg total) by mouth daily.  Dispense:  30 capsule    Refill:  0    Face-to-face time spent with patient was 45 minutes. Greater than 50% of time was spent in counseling and coordination of care.  Follow-Up Instructions: Return in 4 weeks (on 02/27/2019) for DDD, OA.   Bo Merino, MD  Note - This record has been created using Editor, commissioning.  Chart creation errors have been sought, but may not always  have been located. Such creation errors do not reflect on  the standard of medical care.

## 2019-01-24 NOTE — Patient Instructions (Signed)
Plan: Avoid bending, stooping and avoid lifting weights greater than 10 lbs. Avoid prolong standing and walking. Avoid frequent bending and stooping  No lifting greater than 10 lbs. May use ice or moist heat for pain. Weight loss is of benefit. Handicap license is approved. Dr. Romona Curls secretary/Assistant will call to arrange for Radiofrequency Abalation of the lumbar facets for arthrosis.  Hydrocodone intermittantly for pain.

## 2019-01-28 ENCOUNTER — Other Ambulatory Visit: Payer: Self-pay | Admitting: Specialist

## 2019-01-28 MED ORDER — OXYCODONE-ACETAMINOPHEN 5-325 MG PO TABS: 1.0000 | ORAL_TABLET | Freq: Four times a day (QID) | ORAL | 0 refills | Status: DC | PRN

## 2019-01-28 NOTE — Addendum Note (Signed)
Addended by: Minda Ditto, Geoffery Spruce on: 01/28/2019 08:09 AM   Modules accepted: Orders

## 2019-01-28 NOTE — Telephone Encounter (Signed)
Patient's wife called requesting an RX refill on the patient's Oxycodone.  Patient uses [facility] in [place].  CB#[phone].  Thank you.

## 2019-01-29 ENCOUNTER — Ambulatory Visit: Payer: Medicare Other | Admitting: Physical Medicine and Rehabilitation

## 2019-01-30 ENCOUNTER — Encounter: Payer: Self-pay | Admitting: Rheumatology

## 2019-01-30 ENCOUNTER — Ambulatory Visit (INDEPENDENT_AMBULATORY_CARE_PROVIDER_SITE_OTHER): Payer: Medicare Other | Admitting: Rheumatology

## 2019-01-30 ENCOUNTER — Other Ambulatory Visit: Payer: Self-pay

## 2019-01-30 VITALS — BP 157/88 | HR 78 | Resp 15 | Ht 71.0 in | Wt 191.2 lb

## 2019-01-30 DIAGNOSIS — M545 Low back pain, unspecified: Secondary | ICD-10-CM

## 2019-01-30 DIAGNOSIS — N183 Chronic kidney disease, stage 3 unspecified: Secondary | ICD-10-CM

## 2019-01-30 DIAGNOSIS — I714 Abdominal aortic aneurysm, without rupture, unspecified: Secondary | ICD-10-CM

## 2019-01-30 DIAGNOSIS — I951 Orthostatic hypotension: Secondary | ICD-10-CM

## 2019-01-30 DIAGNOSIS — M19012 Primary osteoarthritis, left shoulder: Secondary | ICD-10-CM

## 2019-01-30 DIAGNOSIS — M546 Pain in thoracic spine: Secondary | ICD-10-CM | POA: Diagnosis not present

## 2019-01-30 DIAGNOSIS — M19011 Primary osteoarthritis, right shoulder: Secondary | ICD-10-CM

## 2019-01-30 DIAGNOSIS — Z8781 Personal history of (healed) traumatic fracture: Secondary | ICD-10-CM

## 2019-01-30 DIAGNOSIS — E1122 Type 2 diabetes mellitus with diabetic chronic kidney disease: Secondary | ICD-10-CM

## 2019-01-30 DIAGNOSIS — M47816 Spondylosis without myelopathy or radiculopathy, lumbar region: Secondary | ICD-10-CM

## 2019-01-30 DIAGNOSIS — E1169 Type 2 diabetes mellitus with other specified complication: Secondary | ICD-10-CM

## 2019-01-30 DIAGNOSIS — E785 Hyperlipidemia, unspecified: Secondary | ICD-10-CM

## 2019-01-30 DIAGNOSIS — I739 Peripheral vascular disease, unspecified: Secondary | ICD-10-CM

## 2019-01-30 DIAGNOSIS — G903 Multi-system degeneration of the autonomic nervous system: Secondary | ICD-10-CM

## 2019-01-30 DIAGNOSIS — Z8673 Personal history of transient ischemic attack (TIA), and cerebral infarction without residual deficits: Secondary | ICD-10-CM

## 2019-01-30 DIAGNOSIS — M7918 Myalgia, other site: Secondary | ICD-10-CM

## 2019-01-30 DIAGNOSIS — M47814 Spondylosis without myelopathy or radiculopathy, thoracic region: Secondary | ICD-10-CM

## 2019-01-30 DIAGNOSIS — Z951 Presence of aortocoronary bypass graft: Secondary | ICD-10-CM

## 2019-01-30 DIAGNOSIS — M40294 Other kyphosis, thoracic region: Secondary | ICD-10-CM

## 2019-01-30 DIAGNOSIS — K219 Gastro-esophageal reflux disease without esophagitis: Secondary | ICD-10-CM

## 2019-01-30 DIAGNOSIS — M170 Bilateral primary osteoarthritis of knee: Secondary | ICD-10-CM

## 2019-01-30 MED ORDER — DULOXETINE HCL 20 MG PO CPEP: 20.0000 mg | ORAL_CAPSULE | Freq: Every day | ORAL | 0 refills | Status: DC

## 2019-01-30 NOTE — Patient Instructions (Addendum)
Start Cymbalta 20 mg daily for 1 month  Take iron 1 hour before or 2 hours after zinc/calcium supplement to avoid decreased absorption  Duloxetine delayed-release capsules What is this medicine? DULOXETINE (doo LOX e teen) is used to treat depression, anxiety, and different types of chronic pain. This medicine may be used for other purposes; ask your health care provider or pharmacist if you have questions. COMMON BRAND NAME(S): Cymbalta, Creig Hines, [place] What should I tell my health care provider before I take this medicine? They need to know if you have any of these conditions:  bipolar disorder  glaucoma  high blood pressure  kidney disease  liver disease  seizures  suicidal thoughts, plans or attempt; a previous suicide attempt by you or a family member  take medicines that treat or prevent blood clots  taken medicines called MAOIs like Carbex, Eldepryl, Marplan, Nardil, and Parnate within 14 days  trouble passing urine  an unusual reaction to duloxetine, other medicines, foods, dyes, or preservatives  pregnant or trying to get pregnant  breast-feeding How should I use this medicine? Take this medicine by mouth with a glass of water. Follow the directions on the prescription label. Do not crush, cut or chew some capsules of this medicine. Some capsules may be opened and sprinkled on applesauce. Check with your doctor or pharmacist if you are not sure. You can take this medicine with or without food. Take your medicine at regular intervals. Do not take your medicine more often than directed. Do not stop taking this medicine suddenly except upon the advice of your doctor. Stopping this medicine too quickly may cause serious side effects or your condition may worsen. A special MedGuide will be given to you by the pharmacist with each prescription and refill. Be sure to read this information carefully each time. Talk to your pediatrician regarding the use of this medicine in  children. While this drug may be prescribed for children as young as 74 years of age for selected conditions, precautions do apply. Overdosage: If you think you have taken too much of this medicine contact a poison control center or emergency room at once. NOTE: This medicine is only for you. Do not share this medicine with others. What if I miss a dose? If you miss a dose, take it as soon as you can. If it is almost time for your next dose, take only that dose. Do not take double or extra doses. What may interact with this medicine? Do not take this medicine with any of the following medications:  desvenlafaxine  levomilnacipran  linezolid  MAOIs like Carbex, Eldepryl, Marplan, Nardil, and Parnate  methylene blue (injected into a vein)  milnacipran  thioridazine  venlafaxine This medicine may also interact with the following medications:  alcohol  amphetamines  aspirin and aspirin-like medicines  certain antibiotics like ciprofloxacin and enoxacin  certain medicines for blood pressure, heart disease, irregular heart beat  certain medicines for depression, anxiety, or psychotic disturbances  certain medicines for migraine headache like almotriptan, eletriptan, frovatriptan, naratriptan, rizatriptan, sumatriptan, zolmitriptan  certain medicines that treat or prevent blood clots like warfarin, enoxaparin, and dalteparin  cimetidine  fentanyl  lithium  NSAIDS, medicines for pain and inflammation, like ibuprofen or naproxen  phentermine  procarbazine  rasagiline  sibutramine  St. John's wort  theophylline  tramadol  tryptophan This list may not describe all possible interactions. Give your health care provider a list of all the medicines, herbs, non-prescription drugs, or dietary supplements you  use. Also tell them if you smoke, drink alcohol, or use illegal drugs. Some items may interact with your medicine. What should I watch for while using this  medicine? Tell your doctor if your symptoms do not get better or if they get worse. Visit your doctor or healthcare provider for regular checks on your progress. Because it may take several weeks to see the full effects of this medicine, it is important to continue your treatment as prescribed by your doctor. This medicine may cause serious skin reactions. They can happen weeks to months after starting the medicine. Contact your healthcare provider right away if you notice fevers or flu-like symptoms with a rash. The rash may be red or purple and then turn into blisters or peeling of the skin. Or, you might notice a red rash with swelling of the face, lips, or lymph nodes in your neck or under your arms. Patients and their families should watch out for new or worsening thoughts of suicide or depression. Also watch out for sudden changes in feelings such as feeling anxious, agitated, panicky, irritable, hostile, aggressive, impulsive, severely restless, overly excited and hyperactive, or not being able to sleep. If this happens, especially at the beginning of treatment or after a change in dose, call your healthcare provider. You may get drowsy or dizzy. Do not drive, use machinery, or do anything that needs mental alertness until you know how this medicine affects you. Do not stand or sit up quickly, especially if you are an older patient. This reduces the risk of dizzy or fainting spells. Alcohol may interfere with the effect of this medicine. Avoid alcoholic drinks. This medicine can cause an increase in blood pressure. This medicine can also cause a sudden drop in your blood pressure, which may make you feel faint and increase the chance of a fall. These effects are most common when you first start the medicine or when the dose is increased, or during use of other medicines that can cause a sudden drop in blood pressure. Check with your doctor for instructions on monitoring your blood pressure while taking  this medicine. Your mouth may get dry. Chewing sugarless gum or sucking hard candy, and drinking plenty of water, may help. Contact your doctor if the problem does not go away or is severe. What side effects may I notice from receiving this medicine? Side effects that you should report to your doctor or health care professional as soon as possible:  allergic reactions like skin rash, itching or hives, swelling of the face, lips, or tongue  anxious  breathing problems  confusion  changes in vision  chest pain  confusion  elevated mood, decreased need for sleep, racing thoughts, impulsive behavior  eye pain  fast, irregular heartbeat  feeling faint or lightheaded, falls  feeling agitated, angry, or irritable  hallucination, loss of contact with reality  high blood pressure  loss of balance or coordination  palpitations  redness, blistering, peeling or loosening of the skin, including inside the mouth  restlessness, pacing, inability to keep still  seizures  stiff muscles  suicidal thoughts or other mood changes  trouble passing urine or change in the amount of urine  trouble sleeping  unusual bleeding or bruising  unusually weak or tired  vomiting  yellowing of the eyes or skin Side effects that usually do not require medical attention (report to your doctor or health care professional if they continue or are bothersome):  change in sex drive or performance  change in appetite or weight  constipation  dizziness  dry mouth  headache  increased sweating  nausea  tired This list may not describe all possible side effects. Call your doctor for medical advice about side effects. You may report side effects to FDA at 1-800-FDA-1088. Where should I keep my medicine? Keep out of the reach of children. Store at room temperature between 15 and 30 degrees C (59 to 86 degrees F). Throw away any unused medicine after the expiration date. NOTE: This  sheet is a summary. It may not cover all possible information. If you have questions about this medicine, talk to your doctor, pharmacist, or health care provider.  2020 Elsevier/Gold Standard (2018-07-19 13:47:50)

## 2019-01-30 NOTE — Progress Notes (Signed)
Pharmacy Note  Subjective:  Patient presents today to the [facility] to see Dr. Estanislado Pandy.   Patient seen by the pharmacist for counseling on Cymbalta.  Objective: Current Outpatient Medications on File Prior to Visit  Medication Sig Dispense Refill  . acetaminophen (TYLENOL) 500 MG tablet Take 1,000 mg by mouth 2 (two) times daily as needed for headache (pain).     . Ascorbic Acid (VITAMIN C) 500 MG CAPS Take 1 capsule by mouth daily.    Marland Kitchen aspirin EC 81 MG tablet Take 1 tablet (81 mg total) by mouth daily.    . butalbital-acetaminophen-caffeine (FIORICET) 50-325-40 MG tablet TAKE 1 TABLET EVERY 4 HOURS ONLY IF SEVERE HEADACHE 30 tablet 0  . Calcium Carb-Cholecalciferol (CALCIUM+D3 PO) Take 1 tablet by mouth daily.    . Cholecalciferol (VITAMIN D-3) 5000 units TABS Take 5,000 Units by mouth daily.    . Ferrous Sulfate (IRON SLOW RELEASE) 140 (45 Fe) MG TBCR Take 1 tablet by mouth daily.    . fludrocortisone (FLORINEF) 0.1 MG tablet TAKE 2 TABLETS BY MOUTH EVERY DAY (Patient taking differently: 0.05 mg daily. ) 180 tablet 1  . gabapentin (NEURONTIN) 100 MG capsule Take 100 mg by mouth 2 (two) times daily.    Marland Kitchen glucose blood test strip Check blood sugar 1 time daily-[id number] 100 each 5  . Magnesium 500 MG TABS Take 1 tablet by mouth daily.     . Multiple Vitamin (MULTIVITAMIN WITH MINERALS) TABS tablet Take 1 tablet by mouth daily.    Marland Kitchen olmesartan (BENICAR) 40 MG tablet Take 20-40 mg by mouth as needed.    . Omega-3 Fatty Acids (FISH OIL OMEGA-3 PO) Take 1 capsule by mouth daily.    Marland Kitchen oxyCODONE-acetaminophen (PERCOCET/ROXICET) 5-325 MG tablet Take 1 tablet by mouth every 6 (six) hours as needed for severe pain. 30 tablet 0  . pantoprazole (PROTONIX) 40 MG tablet Take 1 tablet Daily for Acid Indigestion & Reflux 90 tablet 1  . polyethylene glycol (MIRALAX / GLYCOLAX) packet Take 17 g by mouth daily. (Patient taking differently: Take 17 g by mouth 2 (two) times daily. ) 28 each 1   . POTASSIUM CHLORIDE PO Take 595 mg by mouth daily.     . vitamin B-12 (CYANOCOBALAMIN) 1000 MCG tablet Take 1,000 mcg by mouth daily.    . Zinc 50 MG TABS Take 1 tablet by mouth daily.     No current facility-administered medications on file prior to visit.      Assessment/Plan:  Patient was counseled on the purpose, proper use, and adverse effects of duloxetine including nausea, constipation, dizziness, confusion, blurry vision, increased blood pressure, increased sweating, headache, and urinary retention.  Reviewed black boxed warning of increased risk of suicidal thoughts in young adults.  Provided patient with educational materials on duloxetine and answered all questions.    The plan is to start Cymbalta 20 mg daily for 1 month and then increase to 30 mg daily if tolerated.  Reviewed medications and no major drug-drug interactions with Cymbalta.  He is taking iron, zinc, andcalcium supplement which can decrease iron absorption.  Instructed patient to take iron 1 hour before or 2 hours after zinc/calcium supplement. Patient verbalized understanding.  All questions encouraged and answered.  Instructed patient to call with any questions or concerns.   Mariella Saa, PharmD, [place], [place] [facility] Pharmacist [phone]  01/30/2019 9:26 AM

## 2019-02-01 NOTE — Progress Notes (Signed)
Carelink Summary Report / Loop Recorder 

## 2019-02-05 ENCOUNTER — Telehealth: Payer: Self-pay | Admitting: Rheumatology

## 2019-02-05 NOTE — Telephone Encounter (Signed)
[name] left a voicemail stating [name] was prescribed Duloxetine and states "he is having a difficult time functioning when he is taking the medication" and requesting a return call.

## 2019-02-05 NOTE — Telephone Encounter (Signed)
Patient states he took the Cymbalta and "couldn't walk the next morning." Patient states his legs are sore. Patient states he also experienced drowsiness. Patient states he is not going to take the medication.

## 2019-02-07 ENCOUNTER — Ambulatory Visit: Payer: Self-pay

## 2019-02-07 ENCOUNTER — Ambulatory Visit (INDEPENDENT_AMBULATORY_CARE_PROVIDER_SITE_OTHER): Payer: Medicare Other | Admitting: Physical Medicine and Rehabilitation

## 2019-02-07 ENCOUNTER — Encounter: Payer: Self-pay | Admitting: Physical Medicine and Rehabilitation

## 2019-02-07 ENCOUNTER — Other Ambulatory Visit: Payer: Self-pay | Admitting: Physical Medicine and Rehabilitation

## 2019-02-07 VITALS — BP 174/68 | HR 76

## 2019-02-07 DIAGNOSIS — G8929 Other chronic pain: Secondary | ICD-10-CM | POA: Diagnosis not present

## 2019-02-07 DIAGNOSIS — M47816 Spondylosis without myelopathy or radiculopathy, lumbar region: Secondary | ICD-10-CM | POA: Diagnosis not present

## 2019-02-07 DIAGNOSIS — E1342 Other specified diabetes mellitus with diabetic polyneuropathy: Secondary | ICD-10-CM | POA: Diagnosis not present

## 2019-02-07 DIAGNOSIS — W19XXXA Unspecified fall, initial encounter: Secondary | ICD-10-CM

## 2019-02-07 DIAGNOSIS — M545 Low back pain, unspecified: Secondary | ICD-10-CM | POA: Insufficient documentation

## 2019-02-07 DIAGNOSIS — Y92009 Unspecified place in unspecified non-institutional (private) residence as the place of occurrence of the external cause: Secondary | ICD-10-CM | POA: Diagnosis not present

## 2019-02-07 MED ORDER — METHYLPREDNISOLONE ACETATE 80 MG/ML IJ SUSP
80.0000 mg | Freq: Once | INTRAMUSCULAR | Status: AC
  Administered 2019-02-07: 80 mg

## 2019-02-07 MED ORDER — OXYCODONE-ACETAMINOPHEN 5-325 MG PO TABS: 1.0000 | ORAL_TABLET | Freq: Four times a day (QID) | ORAL | 0 refills | Status: DC | PRN

## 2019-02-07 NOTE — Progress Notes (Signed)
.  Numeric Pain Rating Scale and Functional Assessment Average Pain 7   In the last MONTH (on 0-10 scale) has pain interfered with the following?  1. General activity like being  able to carry out your everyday physical activities such as walking, climbing stairs, carrying groceries, or moving a chair?  Rating(7)   +Driver, -BT, -Dye Allergies.   

## 2019-02-08 ENCOUNTER — Inpatient Hospital Stay (HOSPITAL_COMMUNITY): Admission: RE | Admit: 2019-02-08 | Payer: Medicare Other | Source: Ambulatory Visit

## 2019-02-11 ENCOUNTER — Other Ambulatory Visit: Payer: Self-pay | Admitting: Cardiology

## 2019-02-11 ENCOUNTER — Ambulatory Visit (HOSPITAL_COMMUNITY)
Admission: RE | Admit: 2019-02-11 | Discharge: 2019-02-11 | Disposition: A | Discharge status: Home / Self Care | Payer: Medicare Other | Source: Ambulatory Visit | Attending: Cardiology | Admitting: Cardiology

## 2019-02-11 ENCOUNTER — Other Ambulatory Visit: Payer: Self-pay

## 2019-02-11 ENCOUNTER — Other Ambulatory Visit (HOSPITAL_COMMUNITY): Payer: Medicare Other

## 2019-02-11 DIAGNOSIS — I714 Abdominal aortic aneurysm, without rupture, unspecified: Secondary | ICD-10-CM

## 2019-02-11 DIAGNOSIS — I723 Aneurysm of iliac artery: Secondary | ICD-10-CM

## 2019-02-11 NOTE — Progress Notes (Signed)
NO SHOW

## 2019-02-12 ENCOUNTER — Ambulatory Visit: Payer: Medicare Other | Admitting: Adult Health Nurse Practitioner

## 2019-02-13 ENCOUNTER — Telehealth: Payer: Self-pay | Admitting: *Deleted

## 2019-02-13 DIAGNOSIS — I714 Abdominal aortic aneurysm, without rupture, unspecified: Secondary | ICD-10-CM

## 2019-02-13 DIAGNOSIS — I723 Aneurysm of iliac artery: Secondary | ICD-10-CM

## 2019-02-13 NOTE — Telephone Encounter (Addendum)
Left message for pt to call   ----- Message from Lelon Perla, MD sent at 02/11/2019 11:20 AM EDT ----- Would arrange vascular surgery eval for iliac aneurysm Kirk Ruths  Fu ultrasound six months  Kirk Ruths

## 2019-02-13 NOTE — Progress Notes (Signed)
Office Visit Note  Patient: [name]             Date of Birth: 07-14-1933           MRN: [id number]             PCP: [name], MD Referring: [name], MD Visit Date: 02/27/2019 Occupation: @GUAROCC @  Subjective:  Discussed DXA scan   History of Present Illness: [name] is a 85 y.o. male with history of osteoarthritis, degenerative disc disease.  Patient states he tried Cymbalta which helped him with the pain but he had a lot of side effects and he discontinued the medication.  He states he has had 3 falls in the last week.  He has acquired some bruising from that.  He did not lose consciousness.  He states the falls have been due to weakness in his lower extremities.  He has been under care of Dr. Louanne Skye and is requesting an appointment.  He is trying not to walk and using a wheelchair right now.  He continues to have lower back pain.  We brought him today to discuss bone density results.  Activities of Daily Living:  Patient reports morning stiffness for 0 minute.   Patient Denies nocturnal pain.  Difficulty dressing/grooming: Reports Difficulty climbing stairs: Reports Difficulty getting out of chair: Reports Difficulty using hands for taps, buttons, cutlery, and/or writing: Denies  Review of Systems  Constitutional: Positive for fatigue. Negative for night sweats.  HENT: Negative for mouth sores, mouth dryness and nose dryness.   Eyes: Negative for redness and dryness.  Respiratory: Negative for shortness of breath and difficulty breathing.   Cardiovascular: Negative for chest pain, palpitations, hypertension, irregular heartbeat and swelling in legs/feet.  Gastrointestinal: Negative for constipation and diarrhea.  Endocrine: Negative for increased urination.  Musculoskeletal: Positive for arthralgias and joint pain. Negative for joint swelling, myalgias, muscle weakness, morning stiffness, muscle tenderness and myalgias.  Skin: Negative for  color change, rash, hair loss, nodules/bumps, skin tightness, ulcers and sensitivity to sunlight.  Allergic/Immunologic: Negative for susceptible to infections.  Neurological: Negative for dizziness, fainting, memory loss, night sweats and weakness ( ).  Hematological: Negative for swollen glands.  Psychiatric/Behavioral: Positive for sleep disturbance. Negative for depressed mood. The patient is not nervous/anxious.     PMFS History:  Patient Active Problem List   Diagnosis Date Noted  . Chronic bilateral low back pain without sciatica 02/07/2019  . Statin intolerance 11/16/2018  . 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 Hyperlipidemia, mixed 08/15/2018  . Low back pain 07/18/2018  . Acute blood loss anemia 06/24/2018  . Upper GI bleed 06/24/2018  . History of lacunar cerebrovascular accident (CVA) 02/12/2018  . Dizziness 02/12/2018  . Myofascial pain 12/18/2017  . Spondylosis without myelopathy or radiculopathy, lumbar region 12/18/2017  . Lumbar radiculopathy, right 12/18/2017  . Cholelithiasis 05/17/2017  . Sinus Bradycardia 05/17/2017  . Thoracic radiculopathy 04/12/2017  . Primary osteoarthritis of right knee 12/14/2016  . DDD (degenerative disc disease), lumbar 10/10/2016  . Lumbar facet arthropathy 08/29/2016  . Idiopathic scoliosis 03/22/2016  . Diabetic neuropathy ([place]) 03/22/2016  . Dysautonomia orthostatic hypotension syndrome ([place]) 02/25/2016  . CKD stage 3 due to type 2 diabetes mellitus ([place]) 02/19/2016  . Coronary artery disease involving coronary bypass graft of native heart without angina pectoris   . Diabetes mellitus type 2, diet-controlled ([place])   . 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 Medication management 08/11/2015  . 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  PVD (peripheral vascular disease) ([place]) 01/01/2014  . Hyperlipidemia associated with type 2 diabetes mellitus ([place]) 04/11/2013  . Supine hypertension   . Vitamin D deficiency   . Vitamin B 12 deficiency   .  Coronary atherosclerosis- s/p PCI to LAD in 2009 and PCI to RCA in 2011 02/27/2009  . Abdominal aortic aneurysm ([place]) 02/27/2009  . GERD 02/27/2009    Past Medical History:  Diagnosis Date  . 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 Colon polyps   . Coronary atherosclerosis of unspecified type of vessel, native or graft   . Diabetes ([place])   . Difficult intubation   . Esophageal reflux   . High cholesterol   . History of IBS 02/27/2009  . Hypertension    pt denies, he says he has a h/o hypotension. If BP up he adjusts the Florinef  . Orthostatic hypotension    "BP has been dropping alot when I stand up for the last month or so" (02/17/2016)  . Vitamin B 12 deficiency   . Vitamin D deficiency     Family History  Problem Relation Age of Onset  . Heart attack Father        died age 32  . Heart attack Brother        died age 9  . Anuerysm Brother        aortic  . Heart attack Sister        died age 33  . Colon cancer Sister   . Liver cancer Sister   . Diabetes Maternal Grandmother   . Arthritis Mother   . Dementia Mother   . Heart Problems Brother   . Heart Problems Brother   . Heart Problems Brother   . Heart Problems Brother   . Healthy Daughter   . Healthy Son   . Esophageal cancer Neg Hx   . Inflammatory bowel disease Neg Hx   . Pancreatic cancer Neg Hx   . Stomach cancer Neg Hx    Past Surgical History:  Procedure Laterality Date  . BIOPSY  06/25/2018   Procedure: BIOPSY;  Surgeon: Rush Landmark Telford Nab., MD;  Location: [facility];  Service: Gastroenterology;;  . BLADDER SURGERY  1969   traumatic pelvic fractures, urethral and bladder repair  . CARDIAC CATHETERIZATION N/A 07/01/2015   Procedure: Left Heart Cath and Coronary Angiography;  Surgeon: Wellington Hampshire, MD;  Location: [facility];  Service: Cardiovascular;  Laterality: N/A;  . COLON RESECTION N/A 05/17/2017   Procedure: DIAGNOSTIC LAPAROSCOPY,;  Surgeon: Leighton Ruff, MD;  Location: [facility];   Service: General;  Laterality: N/A;  . CORONARY ANGIOPLASTY WITH STENT PLACEMENT    . CORONARY ARTERY BYPASS GRAFT N/A 07/06/2015   Procedure: CORONARY ARTERY BYPASS GRAFTING (CABG)x 4   utilizing the left internal mammary artery and endoscopically harvested bilateral  sapheneous vein.;  Surgeon: Ivin Poot, MD;  Location: [facility];  Service: Open Heart Surgery;  Laterality: N/A;  . ESOPHAGOGASTRODUODENOSCOPY (EGD) WITH PROPOFOL N/A 06/25/2018   Procedure: ESOPHAGOGASTRODUODENOSCOPY (EGD) WITH PROPOFOL;  Surgeon: Rush Landmark Telford Nab., MD;  Location: [facility];  Service: Gastroenterology;  Laterality: N/A;  . KNEE SURGERY    . LOOP RECORDER INSERTION N/A 02/19/2018   Procedure: LOOP RECORDER INSERTION;  Surgeon: Deboraha Sprang, MD;  Location: [facility];  Service: Cardiovascular;  Laterality: N/A;  . TEE WITHOUT CARDIOVERSION N/A 07/06/2015   Procedure: TRANSESOPHAGEAL ECHOCARDIOGRAM (TEE);  Surgeon: Ivin Poot, MD;  Location: [facility];  Service:  Open Heart Surgery;  Laterality: N/A;   Social History   Social History Narrative   Lives at home with his wife Estill Bamberg   Right handed   No caffeine   Immunization History  Administered Date(s) Administered  . Influenza, High Dose Seasonal PF 12/30/2013, 02/02/2015, 01/27/2016  . Influenza-Unspecified 01/13/2013, 01/29/2017, 01/30/2018  . Pneumococcal Conjugate-13 02/14/2014  . Pneumococcal-Unspecified 06/15/2004  . Tetanus 10/01/2012  . Zoster 10/01/2012     Objective: Vital Signs: BP 131/79 (BP Location: Right Arm, Patient Position: Sitting, Cuff Size: Normal)   Pulse 77   Resp 14   Ht 5\' 11"  (1.803 m)   Wt 180 lb (81.6 kg) Comment: per patient, patient in wheelchair  BMI 25.10 kg/m    Physical Exam Vitals signs and nursing note reviewed.  Constitutional:      Appearance: He is well-developed.  HENT:     Head: Normocephalic and atraumatic.  Eyes:     Conjunctiva/sclera: Conjunctivae normal.     Pupils: Pupils are  equal, round, and reactive to light.  Neck:     Musculoskeletal: Normal range of motion and neck supple.  Cardiovascular:     Rate and Rhythm: Normal rate and regular rhythm.     Heart sounds: Normal heart sounds.  Pulmonary:     Effort: Pulmonary effort is normal.     Breath sounds: Normal breath sounds.  Abdominal:     General: Bowel sounds are normal.     Palpations: Abdomen is soft.  Skin:    General: Skin is warm and dry.     Capillary Refill: Capillary refill takes less than 2 seconds.  Neurological:     Mental Status: He is alert and oriented to person, place, and time.  Psychiatric:        Behavior: Behavior normal.      Musculoskeletal Exam: C-spine limited range of motion.  He has thoracic kyphosis.  He has limited range of motion of lumbar spine with discomfort.  Shoulder joints and elbow joints in good range of motion.  He has no synovitis over PIP and DIPs.  He had discomfort with range of motion of right knee joint.  No synovitis was noted.  CDAI Exam: CDAI Score: - Patient Global: -; Provider Global: - Swollen: -; Tender: - Joint Exam   No joint exam has been documented for this visit   There is currently no information documented on the homunculus. Go to the Rheumatology activity and complete the homunculus joint exam.  Investigation: No additional findings.  Imaging: Vas [place] Aaa Duplex  Result Date: 02/11/2019 ABDOMINAL AORTA STUDY Indications: Follow up exam for known AAA. Patient his history of chronic back              pain due to degenerative disc disease of the thoracic and lumbar              spine. Risk Factors: Hyperlipidemia, Diabetes, past history of smoking, coronary artery               disease, prior CVA. Limitations: Air/bowel gas.  Comparison Study: In 10/19, an aortic duplex showed the distal abdominal aorta                   measuring 3.8 x 3.6 cm. The right common iliac measured 2.4 x                   2.3 cm and the left common iliac artery  measured 2.0 x 2.0 cm.  Performing Technologist: Mariane Masters RVT  Examination Guidelines: A complete evaluation includes B-mode imaging, spectral Doppler, color Doppler, and power Doppler as needed of all accessible portions of each vessel. Bilateral testing is considered an integral part of a complete examination. Limited examinations for reoccurring indications may be performed as noted.  Abdominal Aorta Findings: +-------------+-------+----------+----------+--------+--------+--------+ Location     AP (cm)Trans (cm)PSV (cm/s)WaveformThrombusComments +-------------+-------+----------+----------+--------+--------+--------+ Proximal     2.80   2.80      87        biphasic                 +-------------+-------+----------+----------+--------+--------+--------+ Mid          3.30   3.20      51        biphasic        fusiform +-------------+-------+----------+----------+--------+--------+--------+ Distal       3.80   3.50      28        biphasic        fusiform +-------------+-------+----------+----------+--------+--------+--------+ RT CIA Prox  2.3    2.2       36        biphasic                 +-------------+-------+----------+----------+--------+--------+--------+ RT CIA Mid                    37        biphasic                 +-------------+-------+----------+----------+--------+--------+--------+ RT CIA Distal                 55        biphasic                 +-------------+-------+----------+----------+--------+--------+--------+ RT EIA Prox  1.3    1.3       82        biphasic                 +-------------+-------+----------+----------+--------+--------+--------+ LT CIA Prox  2.0    2.0       45        biphasic                 +-------------+-------+----------+----------+--------+--------+--------+ LT CIA Mid                    27        biphasic                  +-------------+-------+----------+----------+--------+--------+--------+ LT CIA Distal2.8    2.9       34        biphasic        fusiform +-------------+-------+----------+----------+--------+--------+--------+ LT EIA Prox  1.1    1.1       72        biphasic                 +-------------+-------+----------+----------+--------+--------+--------+ Visualization of the Mid Abdominal Aorta was limited. The distal left common iliac artery is aneurysmal, measuring 2.8 x 2.9 cm trv.  Findings reported to Dr. Stanford Breed via staff message at 10:45 am. Summary: Abdominal Aorta: There is evidence of abnormal dilatation of the mid and distal Abdominal aorta. There is evidence of abnormal dilation of the Right Common Iliac artery and Left Common Iliac artery. The largest aortic measurement is 3.8 cm. The distal left common iliac artery now measures 2.8 x 2.9 cm;  increased from previous exam. The largest aortic diameter remains essentially unchanged compared to prior exam. Previous diameter measurement was 3.8 cm obtained on 02/09/18.  *See table(s) above for measurements and observations. Suggest follow up study in 6 months. Vascular surgery consult recommended if clinically indicated.  Electronically signed by Quay Burow MD on 02/11/2019 at 5:08:57 PM.    Final    Xr C-arm No Report  Result Date: 02/14/2019 Please see Notes tab for imaging impression.  Xr C-arm No Report  Result Date: 02/07/2019 Please see Notes tab for imaging impression.   Recent Labs: Lab Results  Component Value Date   WBC 11.8 (H) 02/20/2019   HGB 13.3 02/20/2019   PLT 211 02/20/2019   NA 145 02/20/2019   K 5.1 02/20/2019   CL 105 02/20/2019   CO2 31 02/20/2019   GLUCOSE 134 (H) 02/20/2019   BUN 28 (H) 02/20/2019   CREATININE 1.31 (H) 02/20/2019   BILITOT 0.5 02/20/2019   ALKPHOS 46 06/24/2018   AST 12 02/20/2019   ALT 7 (L) 02/20/2019   PROT 6.4 02/20/2019   ALBUMIN 3.0 (L) 06/24/2018   CALCIUM 9.8  02/20/2019   GFRAA 58 (L) 02/20/2019   February 18, 2019 DEXA scan showed T score of -2.5 in the right femoral region.  Change in BMD was -2% from 2014. Speciality Comments: No specialty comments available.  Procedures:  No procedures performed Allergies: Cymbalta [duloxetine hcl], Keflex [cephalexin], Simvastatin, and Sudafed [pseudoephedrine]   Assessment / Plan:     Visit Diagnoses:  Age-related osteoporosis without current pathological fracture -the findings of the DEXA scan were discussed with patient and his wife today.  Based on his history of vertebral fractures and osteoporosis we discussed the option of starting him on Forteo or Tymlos based on the insurance approval.  I will obtain following labs today.  Plan: Parathyroid hormone, intact (no Ca), VITAMIN D 25 Hydroxy (Vit-D Deficiency, Fractures), Phosphorus, TSH, Testosterone, Serum protein electrophoresis with reflex   History of vertebral fracture-I reviewed x-ray of thoracic and lumbar spine today and noticed fractures.  Other kyphosis of thoracic region - Severe kyphosis, height loss, and osteophytes consistent with DDD.   DDD (degenerative disc disease), thoracic  DDD (degenerative disc disease), lumbar -he is on gabapentin, and oxycodone.  He tried Cymbalta for pain management but could not tolerate.  Patient complains of lower extremity weakness and he has had 3 falls in the last week.  I discussed that he can be seen in the emergency room but he would like to see Dr. Louanne Skye.  We have sent a message for Dr. Louanne Skye to see him as soon as possible.  Primary osteoarthritis of both shoulders  Primary osteoarthritis of both knees  S/P CABG x 4  PVD (peripheral vascular disease) (HCC)  Myofascial pain  Hyperlipidemia associated with type 2 diabetes mellitus ([place])  History of lacunar cerebrovascular accident (CVA)  Dysautonomia orthostatic hypotension syndrome (HCC)  Gastroesophageal reflux disease without esophagitis   CKD stage 3 due to type 2 diabetes mellitus ([place])  Abdominal aortic aneurysm (AAA) without rupture ([place])    Orders: Orders Placed This Encounter  Procedures  . Parathyroid hormone, intact (no Ca)  . VITAMIN D 25 Hydroxy (Vit-D Deficiency, Fractures)  . Phosphorus  . TSH  . Testosterone  . Serum protein electrophoresis with reflex   No orders of the defined types were placed in this encounter.     Follow-Up Instructions: Return in about 6 months (around 08/28/2019) for Osteoporosis, Osteoarthritis.  Bo Merino, MD  Note - This record has been created using Editor, commissioning.  Chart creation errors have been sought, but may not always  have been located. Such creation errors do not reflect on  the standard of medical care.

## 2019-02-14 ENCOUNTER — Telehealth: Payer: Self-pay | Admitting: Gastroenterology

## 2019-02-14 ENCOUNTER — Other Ambulatory Visit: Payer: Self-pay

## 2019-02-14 ENCOUNTER — Ambulatory Visit (INDEPENDENT_AMBULATORY_CARE_PROVIDER_SITE_OTHER): Payer: Medicare Other | Admitting: Physical Medicine and Rehabilitation

## 2019-02-14 ENCOUNTER — Ambulatory Visit: Payer: Self-pay

## 2019-02-14 ENCOUNTER — Encounter: Payer: Self-pay | Admitting: Physical Medicine and Rehabilitation

## 2019-02-14 VITALS — BP 176/107 | HR 75

## 2019-02-14 DIAGNOSIS — M47816 Spondylosis without myelopathy or radiculopathy, lumbar region: Secondary | ICD-10-CM

## 2019-02-14 MED ORDER — METHYLPREDNISOLONE ACETATE 80 MG/ML IJ SUSP
80.0000 mg | Freq: Once | INTRAMUSCULAR | Status: AC
  Administered 2019-02-14: 80 mg

## 2019-02-14 NOTE — Telephone Encounter (Signed)
[name] wants to r/s pt's procedure at the hospital. Pls call her.

## 2019-02-14 NOTE — Telephone Encounter (Signed)
° ° °  Please return call to [phone]

## 2019-02-14 NOTE — Progress Notes (Signed)
[name] - 85 y.o. male MRN [id number]  Date of birth: Aug 31, 1933  Office Visit Note: Visit Date: 02/14/2019 PCP: [name], MD Referred by: [name], MD  Subjective: Chief Complaint  Patient presents with  . Lower Back - Pain   HPI: [name] is a 85 y.o. male who comes in today For planned repeat second diagnostic medial branch block of the L3-4, L4-5 and L5-S1 facet joints.  This is a billable block paradigm looking at approval and ultimate radiofrequency ablation for more definitive treatment.  This has been communicated to the patient and to the patient's wife.  The patient brings in a handwritten note from his wife who reports that the patient has just been in severe pain and basically begging for [place] to do anything we can do for him.  Interestingly in the office he does not appear to be in severe pain.  His pain also seems to be a little bit of out of proportion for findings of the lumbar spine which include degenerative changes of facet arthropathy and some degenerative scoliosis.  There is no high-grade nerve compression.  He does take oxycodone for Dr. Louanne Skye.  He has failed conservative care including physical therapy.  Chronic axial back pain which was more than 50% relieved with prior medial branch block.  As soon as we get his pain diary back today if that helps we would look at radiofrequency ablation of those facet joints.  I did write a handwritten note back to his wife stating that we are doing things as fast as we can and we are trying to help them.  There have been some referral pain into the abdomen.  Patient does have aortic aneurysm but it was only 3.3 cm noted on the last MRI which is fairly recent and this has been stable for the last few years.  ROS Otherwise per HPI.  Assessment & Plan: Visit Diagnoses:  1. Spondylosis without myelopathy or radiculopathy, lumbar region     Plan: No additional findings.   Meds & Orders:  Meds ordered  this encounter  Medications  . methylPREDNISolone acetate (DEPO-MEDROL) injection 80 mg    Orders Placed This Encounter  Procedures  . Facet Injection  . XR C-ARM NO REPORT    Follow-up: Return for Review Pain Diary.   Procedures: No procedures performed  Lumbar Diagnostic Facet Joint Nerve Block with Fluoroscopic Guidance   Patient: [name]      Date of Birth: 1934-04-27 MRN: [id number] PCP: [name], MD      Visit Date: 02/14/2019   Universal Protocol:    Date/Time: 02/14/2011:41 PM  Consent Given By: the patient  Position: PRONE  Additional Comments: Vital signs were monitored before and after the procedure. Patient was prepped and draped in the usual sterile fashion. The correct patient, procedure, and site was verified.   Injection Procedure Details:  Procedure Site One Meds Administered:  Meds ordered this encounter  Medications  . methylPREDNISolone acetate (DEPO-MEDROL) injection 80 mg     Laterality: Bilateral  Location/Site:  L3-L4 L4-L5 L5-S1  Needle size: 22 ga.  Needle type:spinal  Needle Placement: Oblique pedical  Findings:   -Comments: There was excellent flow of contrast along the articular pillars without intravascular flow.  Procedure Details: The fluoroscope beam is vertically oriented in AP and then obliqued 15 to 20 degrees to the ipsilateral side of the desired nerve to achieve the "Scotty dog" appearance.  The skin over the target area  of the junction of the superior articulating process and the transverse process (sacral ala if blocking the L5 dorsal rami) was locally anesthetized with a 1 ml volume of 1% Lidocaine without Epinephrine.  The spinal needle was inserted and advanced in a trajectory view down to the target.   After contact with periosteum and negative aspirate for blood and CSF, correct placement without intravascular or epidural spread was confirmed by injecting 0.5 ml. of Isovue-250.  A spot  radiograph was obtained of this image.    Next, a 0.5 ml. volume of the injectate described above was injected. The needle was then redirected to the other facet joint nerves mentioned above if needed.  Prior to the procedure, the patient was given a Pain Diary which was completed for baseline measurements.  After the procedure, the patient rated their pain every 30 minutes and will continue rating at this frequency for a total of 5 hours.  The patient has been asked to complete the Diary and return to [place] by mail, fax or hand delivered as soon as possible.   Additional Comments:  The patient tolerated the procedure well Dressing: 2 x 2 sterile gauze and Band-Aid    Post-procedure details: Patient was observed during the procedure. Post-procedure instructions were reviewed.  Patient left the clinic in stable condition.   Clinical History: MRI LUMBAR SPINE WITHOUT CONTRAST  TECHNIQUE: Multiplanar, multisequence MR imaging of the lumbar spine was performed. No intravenous contrast was administered.  COMPARISON:  09/13/2016  FINDINGS: Segmentation:  5 lumbar type vertebrae  Alignment:  Dextroscoliosis.  Straightening of the lumbar spine.  Vertebrae:  No fracture, evidence of discitis, or bone lesion.  Conus medullaris and cauda equina: Conus extends to the L1-2 level. Conus and cauda equina appear normal.  Paraspinal and other soft tissues: Aortic and iliac aneurysmal enlargement. The abdominal aorta measures up to 3.3 cm where visualized and left common iliac artery measures up to 2.9 cm  Disc levels:  T12- L1: Spondylosis.  No impingement  L1-L2: Spondylosis.  No impingement  L2-L3: Spondylosis with disc narrowing and mild bulging. No impingement  L3-L4: Severe degenerative disc disease with left-sided collapse and asymmetric spurring. There is moderate left foraminal stenosis. Left subarticular recess narrowing without static L4 compression  L4-L5:  Asymmetric leftward degenerative disc collapse with bulge and endplate spurring. Bilateral subarticular recess stenosis with apparent flattening of the left L5 nerve root. There is moderate left more than right foraminal narrowing  L5-S1:Spondylosis and disc degeneration with moderate borderline advanced right foraminal stenosis. The canal is patent  IMPRESSION: 1. Advanced degenerative disease with scoliosis. 2. L3-4 moderate left foraminal narrowing. 3. L4-5 left subarticular recess stenosis and L5 impingement. Moderate bilateral foraminal narrowing. 4. L5-S1 moderate right foraminal narrowing. 5. Aortic and iliac aneurysms without apparent change from 2019 abdominal CT.   Electronically Signed   By: Monte Fantasia M.D.   On: 08/09/2018 09:26   He reports that he quit smoking about 55 years ago. He quit smokeless tobacco use about 31 years ago.  His smokeless tobacco use included chew.  Recent Labs    05/24/18 1617 08/15/18 1117  HGBA1C 6.3* 5.7*    Objective:  VS:  HT:    WT:   BMI:     BP:(!) 176/107  HR:75bpm  TEMP: ( )  RESP:  Physical Exam Musculoskeletal:        General: Tenderness present.     Right lower leg: No edema.     Left lower leg:  No edema.     Comments: Patient ambulates with walker.  He walks with a forward flexed lumbar spine.  He has pain with axial extension and facet loading.  No pain with hip rotation good distal strength.  He does have stocking distribution decreased sensation in the feet.  Skin:    General: Skin is warm and dry.     Findings: No erythema or rash.  Neurological:     General: No focal deficit present.     Mental Status: He is oriented to person, place, and time.     Sensory: Sensory deficit present.     Gait: Gait abnormal.     Deep Tendon Reflexes: Reflexes abnormal.     Ortho Exam Imaging: Xr C-arm No Report  Result Date: 02/14/2019 Please see Notes tab for imaging impression.   Past  Medical/Family/Surgical/Social History: Medications & Allergies reviewed per EMR, new medications updated. Patient Active Problem List   Diagnosis Date Noted  . Chronic bilateral low back pain without sciatica 02/07/2019  . Statin intolerance 11/16/2018  . 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 Hyperlipidemia, mixed 08/15/2018  . Low back pain 07/18/2018  . Acute blood loss anemia 06/24/2018  . Upper GI bleed 06/24/2018  . History of lacunar cerebrovascular accident (CVA) 02/12/2018  . Dizziness 02/12/2018  . Myofascial pain 12/18/2017  . Spondylosis without myelopathy or radiculopathy, lumbar region 12/18/2017  . Lumbar radiculopathy, right 12/18/2017  . Cholelithiasis 05/17/2017  . Sinus Bradycardia 05/17/2017  . Thoracic radiculopathy 04/12/2017  . Primary osteoarthritis of right knee 12/14/2016  . DDD (degenerative disc disease), lumbar 10/10/2016  . Lumbar facet arthropathy 08/29/2016  . Idiopathic scoliosis 03/22/2016  . Diabetic neuropathy ([place]) 03/22/2016  . Dysautonomia orthostatic hypotension syndrome ([place]) 02/25/2016  . CKD stage 3 due to type 2 diabetes mellitus ([place]) 02/19/2016  . Coronary artery disease involving coronary bypass graft of native heart without angina pectoris   . Diabetes mellitus type 2, diet-controlled ([place])   . 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 Medication management 08/11/2015  . 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 PVD (peripheral vascular disease) ([place]) 01/01/2014  . Hyperlipidemia associated with type 2 diabetes mellitus ([place]) 04/11/2013  . Supine hypertension   . Vitamin D deficiency   . Vitamin B 12 deficiency   . Coronary atherosclerosis- s/p PCI to LAD in 2009 and PCI to RCA in 2011 02/27/2009  . Abdominal aortic aneurysm ([place]) 02/27/2009  . GERD 02/27/2009   Past Medical History:  Diagnosis Date  . 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 Colon polyps   . Coronary atherosclerosis of unspecified type of vessel, native or graft   . Diabetes ([place])    . Difficult intubation   . Esophageal reflux   . High cholesterol   . History of IBS 02/27/2009  . Hypertension    pt denies, he says he has a h/o hypotension. If BP up he adjusts the [place]  . Orthostatic hypotension    "BP has been dropping alot when I stand up for the last month or so" (02/17/2016)  . Vitamin B 12 deficiency   . Vitamin D deficiency    Family History  Problem Relation Age of Onset  . Heart attack Father        died age 8  . Heart attack Brother        died age 24  . Anuerysm Brother        aortic  . Heart attack Sister  died age 95  . Colon cancer Sister   . Liver cancer Sister   . Diabetes Maternal Grandmother   . Arthritis Mother   . Dementia Mother   . Heart Problems Brother   . Heart Problems Brother   . Heart Problems Brother   . Heart Problems Brother   . Healthy Daughter   . Healthy Son   . Esophageal cancer Neg Hx   . Inflammatory bowel disease Neg Hx   . Pancreatic cancer Neg Hx   . Stomach cancer Neg Hx    Past Surgical History:  Procedure Laterality Date  . BIOPSY  06/25/2018   Procedure: BIOPSY;  Surgeon: Rush Landmark Telford Nab., MD;  Location: [facility];  Service: Gastroenterology;;  . BLADDER SURGERY  1969   traumatic pelvic fractures, urethral and bladder repair  . CARDIAC CATHETERIZATION N/A 07/01/2015   Procedure: Left Heart Cath and Coronary Angiography;  Surgeon: Wellington Hampshire, MD;  Location: [facility];  Service: Cardiovascular;  Laterality: N/A;  . COLON RESECTION N/A 05/17/2017   Procedure: DIAGNOSTIC LAPAROSCOPY,;  Surgeon: Leighton Ruff, MD;  Location: [facility];  Service: General;  Laterality: N/A;  . CORONARY ANGIOPLASTY WITH STENT PLACEMENT    . CORONARY ARTERY BYPASS GRAFT N/A 07/06/2015   Procedure: CORONARY ARTERY BYPASS GRAFTING (CABG)x 4   utilizing the left internal mammary artery and endoscopically harvested bilateral  sapheneous vein.;  Surgeon: Ivin Poot, MD;  Location: [facility];  Service: Open  Heart Surgery;  Laterality: N/A;  . ESOPHAGOGASTRODUODENOSCOPY (EGD) WITH PROPOFOL N/A 06/25/2018   Procedure: ESOPHAGOGASTRODUODENOSCOPY (EGD) WITH PROPOFOL;  Surgeon: Rush Landmark Telford Nab., MD;  Location: [facility];  Service: Gastroenterology;  Laterality: N/A;  . KNEE SURGERY    . LOOP RECORDER INSERTION N/A 02/19/2018   Procedure: LOOP RECORDER INSERTION;  Surgeon: Deboraha Sprang, MD;  Location: [facility];  Service: Cardiovascular;  Laterality: N/A;  . TEE WITHOUT CARDIOVERSION N/A 07/06/2015   Procedure: TRANSESOPHAGEAL ECHOCARDIOGRAM (TEE);  Surgeon: Ivin Poot, MD;  Location: [place];  Service: Open Heart Surgery;  Laterality: N/A;   Social History   Occupational History  . Occupation: Retired  Tobacco Use  . Smoking status: Former Smoker    Quit date: 07/16/1963    Years since quitting: 55.6  . Smokeless tobacco: Former Systems developer    Types: Chew    Quit date: 1989  Substance and Sexual Activity  . Alcohol use: No  . Drug use: No  . Sexual activity: Not Currently

## 2019-02-14 NOTE — Procedures (Signed)
Lumbar Diagnostic Facet Joint Nerve Block with Fluoroscopic Guidance   Patient: [name]      Date of Birth: [birth year] MRN: [id number] PCP: [name], MD      Visit Date: 02/14/2019   Universal Protocol:    Date/Time: 02/14/2011:41 PM  Consent Given By: the patient  Position: PRONE  Additional Comments: Vital signs were monitored before and after the procedure. Patient was prepped and draped in the usual sterile fashion. The correct patient, procedure, and site was verified.   Injection Procedure Details:  Procedure Site One Meds Administered:  Meds ordered this encounter  Medications  . methylPREDNISolone acetate (DEPO-MEDROL) injection 80 mg     Laterality: Bilateral  Location/Site:  L3-L4 L4-L5 L5-S1  Needle size: 22 ga.  Needle type:spinal  Needle Placement: Oblique pedical  Findings:   -Comments: There was excellent flow of contrast along the articular pillars without intravascular flow.  Procedure Details: The fluoroscope beam is vertically oriented in AP and then obliqued 15 to 20 degrees to the ipsilateral side of the desired nerve to achieve the "Scotty dog" appearance.  The skin over the target area of the junction of the superior articulating process and the transverse process (sacral ala if blocking the L5 dorsal rami) was locally anesthetized with a 1 ml volume of 1% Lidocaine without Epinephrine.  The spinal needle was inserted and advanced in a trajectory view down to the target.   After contact with periosteum and negative aspirate for blood and CSF, correct placement without intravascular or epidural spread was confirmed by injecting 0.5 ml. of Isovue-250.  A spot radiograph was obtained of this image.    Next, a 0.5 ml. volume of the injectate described above was injected. The needle was then redirected to the other facet joint nerves mentioned above if needed.  Prior to the procedure, the patient was given a Pain Diary which was  completed for baseline measurements.  After the procedure, the patient rated their pain every 30 minutes and will continue rating at this frequency for a total of 5 hours.  The patient has been asked to complete the Diary and return to [place] by mail, fax or hand delivered as soon as possible.   Additional Comments:  The patient tolerated the procedure well Dressing: 2 x 2 sterile gauze and Band-Aid    Post-procedure details: Patient was observed during the procedure. Post-procedure instructions were reviewed.  Patient left the clinic in stable condition.

## 2019-02-14 NOTE — Progress Notes (Signed)
 .  Numeric Pain Rating Scale and Functional Assessment Average Pain 9   In the last MONTH (on 0-10 scale) has pain interfered with the following?  1. General activity like being  able to carry out your everyday physical activities such as walking, climbing stairs, carrying groceries, or moving a chair?  Rating(9)   +Driver, -BT, -Dye Allergies.  

## 2019-02-14 NOTE — Telephone Encounter (Signed)
Spoke with pt wife, aware of results. Referral placed for [facility]. Order placed for follow up dopplers.

## 2019-02-14 NOTE — Telephone Encounter (Signed)
I spoke with the pt's wife and she tells me that the pt is having back surgery in about 2 weeks.  I advised her that we have no appt openings until December when the new schedule comes out.  She states she will call back after the pt has recovered from back surgery.

## 2019-02-15 ENCOUNTER — Other Ambulatory Visit: Payer: Self-pay | Admitting: Specialist

## 2019-02-15 MED ORDER — OXYCODONE-ACETAMINOPHEN 5-325 MG PO TABS: 1.0000 | ORAL_TABLET | Freq: Four times a day (QID) | ORAL | 0 refills | Status: DC | PRN

## 2019-02-15 NOTE — Telephone Encounter (Signed)
Received call from patient's wife Estill Bamberg advised patient need Rx refilled (Oxycodone) The number to contact Estill Bamberg is [phone]

## 2019-02-15 NOTE — Telephone Encounter (Signed)
Sent request to Dr. Nitka 

## 2019-02-18 ENCOUNTER — Encounter: Payer: Self-pay | Admitting: Rheumatology

## 2019-02-18 DIAGNOSIS — M40209 Unspecified kyphosis, site unspecified: Secondary | ICD-10-CM | POA: Diagnosis not present

## 2019-02-18 DIAGNOSIS — Z8262 Family history of osteoporosis: Secondary | ICD-10-CM | POA: Diagnosis not present

## 2019-02-18 DIAGNOSIS — I639 Cerebral infarction, unspecified: Secondary | ICD-10-CM | POA: Diagnosis not present

## 2019-02-18 DIAGNOSIS — M810 Age-related osteoporosis without current pathological fracture: Secondary | ICD-10-CM | POA: Diagnosis not present

## 2019-02-18 LAB — HM DEXA SCAN

## 2019-02-20 ENCOUNTER — Encounter: Payer: Self-pay | Admitting: Internal Medicine

## 2019-02-20 ENCOUNTER — Other Ambulatory Visit: Payer: Self-pay

## 2019-02-20 ENCOUNTER — Ambulatory Visit: Payer: Medicare Other | Admitting: Internal Medicine

## 2019-02-20 VITALS — BP 138/68 | HR 64 | Temp 97.0°F | Resp 16 | Ht 72.0 in | Wt 183.0 lb

## 2019-02-20 DIAGNOSIS — E1140 Type 2 diabetes mellitus with diabetic neuropathy, unspecified: Secondary | ICD-10-CM

## 2019-02-20 DIAGNOSIS — I25810 Atherosclerosis of coronary artery bypass graft(s) without angina pectoris: Secondary | ICD-10-CM

## 2019-02-20 DIAGNOSIS — Z0000 Encounter for general adult medical examination without abnormal findings: Secondary | ICD-10-CM

## 2019-02-20 DIAGNOSIS — G894 Chronic pain syndrome: Secondary | ICD-10-CM

## 2019-02-20 DIAGNOSIS — Z87891 Personal history of nicotine dependence: Secondary | ICD-10-CM | POA: Diagnosis not present

## 2019-02-20 DIAGNOSIS — Z79899 Other long term (current) drug therapy: Secondary | ICD-10-CM

## 2019-02-20 DIAGNOSIS — Z136 Encounter for screening for cardiovascular disorders: Secondary | ICD-10-CM

## 2019-02-20 DIAGNOSIS — E559 Vitamin D deficiency, unspecified: Secondary | ICD-10-CM

## 2019-02-20 DIAGNOSIS — N138 Other obstructive and reflux uropathy: Secondary | ICD-10-CM

## 2019-02-20 DIAGNOSIS — I10 Essential (primary) hypertension: Secondary | ICD-10-CM

## 2019-02-20 DIAGNOSIS — E785 Hyperlipidemia, unspecified: Secondary | ICD-10-CM

## 2019-02-20 DIAGNOSIS — E1169 Type 2 diabetes mellitus with other specified complication: Secondary | ICD-10-CM | POA: Diagnosis not present

## 2019-02-20 DIAGNOSIS — Z0001 Encounter for general adult medical examination with abnormal findings: Secondary | ICD-10-CM

## 2019-02-20 DIAGNOSIS — Z8249 Family history of ischemic heart disease and other diseases of the circulatory system: Secondary | ICD-10-CM | POA: Diagnosis not present

## 2019-02-20 DIAGNOSIS — E1122 Type 2 diabetes mellitus with diabetic chronic kidney disease: Secondary | ICD-10-CM

## 2019-02-20 DIAGNOSIS — N183 Chronic kidney disease, stage 3 unspecified: Secondary | ICD-10-CM

## 2019-02-20 DIAGNOSIS — Z951 Presence of aortocoronary bypass graft: Secondary | ICD-10-CM

## 2019-02-20 DIAGNOSIS — Z8673 Personal history of transient ischemic attack (TIA), and cerebral infarction without residual deficits: Secondary | ICD-10-CM

## 2019-02-20 DIAGNOSIS — G903 Multi-system degeneration of the autonomic nervous system: Secondary | ICD-10-CM

## 2019-02-20 DIAGNOSIS — I951 Orthostatic hypotension: Secondary | ICD-10-CM

## 2019-02-20 DIAGNOSIS — Z1211 Encounter for screening for malignant neoplasm of colon: Secondary | ICD-10-CM

## 2019-02-20 DIAGNOSIS — Z125 Encounter for screening for malignant neoplasm of prostate: Secondary | ICD-10-CM

## 2019-02-20 DIAGNOSIS — E782 Mixed hyperlipidemia: Secondary | ICD-10-CM

## 2019-02-20 DIAGNOSIS — Z1212 Encounter for screening for malignant neoplasm of rectum: Secondary | ICD-10-CM

## 2019-02-20 MED ORDER — DULOXETINE HCL 60 MG PO CPEP: ORAL_CAPSULE | ORAL | 3 refills | Status: DC

## 2019-02-20 MED ORDER — GABAPENTIN 600 MG PO TABS: ORAL_TABLET | ORAL | 3 refills | Status: DC

## 2019-02-20 MED ORDER — PREDNISONE 50 MG PO TABS: ORAL_TABLET | ORAL | 0 refills | Status: DC

## 2019-02-20 NOTE — Patient Instructions (Signed)
Vit D  & Vit C 1,000 mg   are recommended to help protect  against the Covid-19 and other Corona viruses.    Also it's recommended  to take  Zinc 50 mg  to help  protect against the Covid-19   and best place to get  is also on [employer]  and don't pay more than 6-8 cents /pill !  ================================ Coronavirus (COVID-19) Are you at risk?  Are you at risk for the Coronavirus (COVID-19)?  To be considered HIGH RISK for Coronavirus (COVID-19), you have to meet the following criteria:  . Traveled to [place], [place], [place], [place] or [place]; or in the [place] to [place], [place], [place]  . or [place]; and have fever, cough, and shortness of breath within the last 2 weeks of travel OR . Been in close contact with a person diagnosed with COVID-19 within the last 2 weeks and have  . fever, cough,and shortness of breath .  . IF YOU DO NOT MEET THESE CRITERIA, YOU ARE CONSIDERED LOW RISK FOR COVID-19.  What to do if you are HIGH RISK for COVID-19?  . If you are having a medical emergency, call 911. . Seek medical care right away. Before you go to a doctor's office, urgent care or emergency department, .  call ahead and tell them about your recent travel, contact with someone diagnosed with COVID-19  .  and your symptoms.  . You should receive instructions from your physician's office regarding next steps of care.  . When you arrive at healthcare provider, tell the healthcare staff immediately you have returned from  . visiting [place], [place], [place], [place] or [place]; or traveled in the [place] to [place], [place],  . [place] or [place] in the last two weeks or you have been in close contact with a person diagnosed with  . COVID-19 in the last 2 weeks.   . Tell the health care staff about your symptoms: fever, cough and shortness of breath. . After you have been seen by a medical provider, you will be either: o Tested for (COVID-19) and  discharged home on quarantine except to seek medical care if  o symptoms worsen, and asked to  - Stay home and avoid contact with others until you get your results (4-5 days)  - Avoid travel on public transportation if possible (such as bus, train, or airplane) or o Sent to the Emergency Department by EMS for evaluation, COVID-19 testing  and  o possible admission depending on your condition and test results.  What to do if you are LOW RISK for COVID-19?  Reduce your risk of any infection by using the same precautions used for avoiding the common cold or flu:  . Wash your hands often with soap and warm water for at least 20 seconds.  If soap and water are not readily available,  . use an alcohol-based hand sanitizer with at least 60% alcohol.  . If coughing or sneezing, cover your mouth and nose by coughing or sneezing into the elbow areas of your shirt or coat, .  into a tissue or into your sleeve (not your hands). . Avoid shaking hands with others and consider head nods or verbal greetings only. . Avoid touching your eyes, nose, or mouth with unwashed hands.  . Avoid close contact with people who are sick. . Avoid places or events with large numbers of people in one location, like concerts or sporting events. .   Carefully consider travel plans you have or are making. . If you are planning any travel outside or inside the [place], visit the CDC's Travelers' Health webpage for the latest health notices. . If you have some symptoms but not all symptoms, continue to monitor at home and seek medical attention  . if your symptoms worsen. . If you are having a medical emergency, call 911. >>>>>>>>>>>>>>>>>>>>>>>>>>>>>>>>>>>>>>>>>>>>>>>>>>>>>>> We Do NOT Approve of  [facility] [place] Soliciting Our Patients  To Do Home Visits  & We Do NOT Approve of [employer] > > > > > > > > > > > > > > > > > > > > > > > > > > > > > > > > > > >  > > > >   Preventive Care for Adults  A  healthy lifestyle and preventive care can promote health and wellness. Preventive health guidelines for men include the following key practices:  A routine yearly physical is a good way to check with your health care provider about your health and preventative screening. It is a chance to share any concerns and updates on your health and to receive a thorough exam.  Visit your dentist for a routine exam and preventative care every 6 months. Brush your teeth twice a day and floss once a day. Good oral hygiene prevents tooth decay and gum disease.  The frequency of eye exams is based on your age, health, family medical history, use of contact lenses, and other factors. Follow your health care provider's recommendations for frequency of eye exams.  Eat a healthy diet. Foods such as vegetables, fruits, whole grains, low-fat dairy products, and lean protein foods contain the nutrients you need without too many calories. Decrease your intake of foods high in solid fats, added sugars, and salt. Eat the right amount of calories for you. Get information about a proper diet from your health care provider, if necessary.  Regular physical exercise is one of the most important things you can do for your health. Most adults should get at least 150 minutes of moderate-intensity exercise (any activity that increases your heart rate and causes you to sweat) each week. In addition, most adults need muscle-strengthening exercises on 2 or more days a week.  Maintain a healthy weight. The body mass index (BMI) is a screening tool to identify possible weight problems. It provides an estimate of body fat based on height and weight. Your health care provider can find your BMI and can help you achieve or maintain a healthy weight. For adults 20 years and older:  A BMI below 18.5 is considered underweight.  A BMI of 18.5 to 24.9 is normal.  A BMI of 25 to 29.9 is considered overweight.  A BMI of 30 and above is considered  obese.  Maintain normal blood lipids and cholesterol levels by exercising and minimizing your intake of saturated fat. Eat a balanced diet with plenty of fruit and vegetables. Blood tests for lipids and cholesterol should begin at age 20 and be repeated every 5 years. If your lipid or cholesterol levels are high, you are over 50, or you are at high risk for heart disease, you may need your cholesterol levels checked more frequently. Ongoing high lipid and cholesterol levels should be treated with medicines if diet and exercise are not working.  If you smoke, find out from your health care provider how to quit. If you do not use tobacco, do not start.  Lung cancer   screening is recommended for adults aged 55-80 years who are at high risk for developing lung cancer because of a history of smoking. A yearly low-dose CT scan of the lungs is recommended for people who have at least a 30-pack-year history of smoking and are a current smoker or have quit within the past 15 years. A pack year of smoking is smoking an average of 1 pack of cigarettes a day for 1 year (for example: 1 pack a day for 30 years or 2 packs a day for 15 years). Yearly screening should continue until the smoker has stopped smoking for at least 15 years. Yearly screening should be stopped for people who develop a health problem that would prevent them from having lung cancer treatment.  If you choose to drink alcohol, do not have more than 2 drinks per day. One drink is considered to be 12 ounces (355 mL) of beer, 5 ounces (148 mL) of wine, or 1.5 ounces (44 mL) of liquor.  Avoid use of street drugs. Do not share needles with anyone. Ask for help if you need support or instructions about stopping the use of drugs.  High blood pressure causes heart disease and increases the risk of stroke. Your blood pressure should be checked at least every 1-2 years. Ongoing high blood pressure should be treated with medicines, if weight loss and exercise  are not effective.  If you are 45-79 years old, ask your health care provider if you should take aspirin to prevent heart disease.  Diabetes screening involves taking a blood sample to check your fasting blood sugar level. Testing should be considered at a younger age or be carried out more frequently if you are overweight and have at least 1 risk factor for diabetes.  Colorectal cancer can be detected and often prevented. Most routine colorectal cancer screening begins at the age of 50 and continues through age 75. However, your health care provider may recommend screening at an earlier age if you have risk factors for colon cancer. On a yearly basis, your health care provider may provide home test kits to check for hidden blood in the stool. Use of a small camera at the end of a tube to directly examine the colon (sigmoidoscopy or colonoscopy) can detect the earliest forms of colorectal cancer. Talk to your health care provider about this at age 50, when routine screening begins. Direct exam of the colon should be repeated every 5-10 years through age 75, unless early forms of precancerous polyps or small growths are found.  Hepatitis C blood testing is recommended for all people born from 1945 through 1965 and any individual with known risks for hepatitis C.  Screening for abdominal aortic aneurysm (AAA)  by ultrasound is recommended for people who have history of high blood pressure or who are current or former smokers.  Healthy men should  receive prostate-specific antigen (PSA) blood tests as part of routine cancer screening. Talk with your health care provider about prostate cancer screening.  Testicular cancer screening is  recommended for adult males. Screening includes self-exam, a health care provider exam, and other screening tests. Consult with your health care provider about any symptoms you have or any concerns you have about testicular cancer.  Use sunscreen. Apply sunscreen liberally  and repeatedly throughout the day. You should seek shade when your shadow is shorter than you. Protect yourself by wearing long sleeves, pants, a wide-brimmed hat, and sunglasses year round, whenever you are outdoors.  Once a month,   do a whole-body skin exam, using a mirror to look at the skin on your back. Tell your health care provider about new moles, moles that have irregular borders, moles that are larger than a pencil eraser, or moles that have changed in shape or color.  Stay current with required vaccines (immunizations).  Influenza vaccine. All adults should be immunized every year.  Tetanus, diphtheria, and acellular pertussis (Td, Tdap) vaccine. An adult who has not previously received Tdap or who does not know his vaccine status should receive 1 dose of Tdap. This initial dose should be followed by tetanus and diphtheria toxoids (Td) booster doses every 10 years. Adults with an unknown or incomplete history of completing a 3-dose immunization series with Td-containing vaccines should begin or complete a primary immunization series including a Tdap dose. Adults should receive a Td booster every 10 years.  Zoster vaccine. One dose is recommended for adults aged 60 years or older unless certain conditions are present.    PREVNAR - Pneumococcal 13-valent conjugate (PCV13) vaccine. When indicated, a person who is uncertain of his immunization history and has no record of immunization should receive the PCV13 vaccine. An adult aged 19 years or older who has certain medical conditions and has not been previously immunized should receive 1 dose of PCV13 vaccine. This PCV13 should be followed with a dose of pneumococcal polysaccharide (PPSV23) vaccine. The PPSV23 vaccine dose should be obtained 1 or more year(s)after the dose of PCV13 vaccine. An adult aged 19 years or older who has certain medical conditions and previously received 1 or more doses of PPSV23 vaccine should receive 1 dose of PCV13.  The PCV13 vaccine dose should be obtained 1 or more years after the last PPSV23 vaccine dose.    PNEUMOVAX - Pneumococcal polysaccharide (PPSV23) vaccine. When PCV13 is also indicated, PCV13 should be obtained first. All adults aged 65 years and older should be immunized. An adult younger than age 65 years who has certain medical conditions should be immunized. Any person who resides in a nursing home or long-term care facility should be immunized. An adult smoker should be immunized. People with an immunocompromised condition and certain other conditions should receive both PCV13 and PPSV23 vaccines. People with human immunodeficiency virus (HIV) infection should be immunized as soon as possible after diagnosis. Immunization during chemotherapy or radiation therapy should be avoided. Routine use of PPSV23 vaccine is not recommended for American Indians, [place] Natives, or people younger than 65 years unless there are medical conditions that require PPSV23 vaccine. When indicated, people who have unknown immunization and have no record of immunization should receive PPSV23 vaccine. One-time revaccination 5 years after the first dose of PPSV23 is recommended for people aged 19-64 years who have chronic kidney failure, nephrotic syndrome, asplenia, or immunocompromised conditions. People who received 1-2 doses of PPSV23 before age 65 years should receive another dose of PPSV23 vaccine at age 65 years or later if at least 5 years have passed since the previous dose. Doses of PPSV23 are not needed for people immunized with PPSV23 at or after age 65 years.    Hepatitis A vaccine. Adults who wish to be protected from this disease, have certain high-risk conditions, work with hepatitis A-infected animals, work in hepatitis A research labs, or travel to or work in countries with a high rate of hepatitis A should be immunized. Adults who were previously unvaccinated and who anticipate close contact with an  international adoptee during the first 60 days after arrival in   the [place] from a country with a high rate of hepatitis A should be immunized.    Hepatitis B vaccine. Adults should be immunized if they wish to be protected from this disease, have certain high-risk conditions, may be exposed to blood or other infectious body fluids, are household contacts or sex partners of hepatitis B positive people, are clients or workers in certain care facilities, or travel to or work in countries with a high rate of hepatitis B.   Preventive Service / Frequency   Ages 65 and over  Blood pressure check.  Lipid and cholesterol check.  Lung cancer screening. / Every year if you are aged 55-80 years and have a 30-pack-year history of smoking and currently smoke or have quit within the past 15 years. Yearly screening is stopped once you have quit smoking for at least 15 years or develop a health problem that would prevent you from having lung cancer treatment.  Fecal occult blood test (FOBT) of stool. You may not have to do this test if you get a colonoscopy every 10 years.  Flexible sigmoidoscopy** or colonoscopy.** / Every 5 years for a flexible sigmoidoscopy or every 10 years for a colonoscopy beginning at age 50 and continuing until age 75.  Hepatitis C blood test.** / For all people born from 1945 through 1965 and any individual with known risks for hepatitis C.  Abdominal aortic aneurysm (AAA) screening./ Screening current or former smokers or have Hypertension.  Skin self-exam. / Monthly.  Influenza vaccine. / Every year.  Tetanus, diphtheria, and acellular pertussis (Tdap/Td) vaccine.** / 1 dose of Td every 10 years.   Zoster vaccine.** / 1 dose for adults aged 60 years or older.         Pneumococcal 13-valent conjugate (PCV13) vaccine.    Pneumococcal polysaccharide (PPSV23) vaccine.     Hepatitis A vaccine.** / Consult your health care provider.  Hepatitis B vaccine.** /  Consult your health care provider. Screening for abdominal aortic aneurysm (AAA)  by ultrasound is recommended for people who have history of high blood pressure or who are current or former smokers. ++++++++++ Recommend Adult Low Dose Aspirin or  coated  Aspirin 81 mg daily  To reduce risk of Colon Cancer 40 %,  Skin Cancer 26 % ,  Malignant Melanoma 46%  and  Pancreatic cancer 60% ++++++++++++++++++++++ Vitamin D goal  is between 70-100.  Please make sure that you are taking your Vitamin D as directed.  It is very important as a natural anti-inflammatory  helping hair, skin, and nails, as well as reducing stroke and heart attack risk.  It helps your bones and helps with mood. It also decreases numerous cancer risks so please take it as directed.  Low Vit D is associated with a 200-300% higher risk for CANCER  and 200-300% higher risk for HEART   ATTACK  &  STROKE.   ...................................... It is also associated with higher death rate at younger ages,  autoimmune diseases like Rheumatoid arthritis, Lupus, Multiple Sclerosis.    Also many other serious conditions, like depression, Alzheimer's Dementia, infertility, muscle aches, fatigue, fibromyalgia - just to name a few. ++++++++++++++++++++++ Recommend the book "The END of DIETING" by Dr Joel Fuhrman  & the book "The END of DIABETES " by Dr Joel Fuhrman At [employer] - get book & Audio CD's    Being diabetic has a  300% increased risk for heart attack, stroke, cancer, and alzheimer- type vascular dementia. It is very important   that you work harder with diet by avoiding all foods that are white. Avoid white rice (brown & wild rice is OK), white potatoes (sweetpotatoes in moderation is OK), White bread or wheat bread or anything made out of white flour like bagels, donuts, rolls, buns, biscuits, cakes, pastries, cookies, pizza crust, and pasta (made from white flour & egg whites) - vegetarian pasta or spinach or wheat  pasta is OK. Multigrain breads like Arnold's or [employer], or multigrain sandwich thins or flatbreads.  Diet, exercise and weight loss can reverse and cure diabetes in the early stages.  Diet, exercise and weight loss is very important in the control and prevention of complications of diabetes which affects every system in your body, ie. Brain - dementia/stroke, eyes - glaucoma/blindness, heart - heart attack/heart failure, kidneys - dialysis, stomach - gastric paralysis, intestines - malabsorption, nerves - severe painful neuritis, circulation - gangrene & loss of a leg(s), and finally cancer and Alzheimers.    I recommend avoid fried & greasy foods,  sweets/candy, white rice (brown or wild rice or Quinoa is OK), white potatoes (sweet potatoes are OK) - anything made from white flour - bagels, doughnuts, rolls, buns, biscuits,white and wheat breads, pizza crust and traditional pasta made of white flour & egg white(vegetarian pasta or spinach or wheat pasta is OK).  Multi-grain bread is OK - like multi-grain flat bread or sandwich thins. Avoid alcohol in excess. Exercise is also important.    Eat all the vegetables you want - avoid meat, especially red meat and dairy - especially cheese.  Cheese is the most concentrated form of trans-fats which is the worst thing to clog up our arteries. Veggie cheese is OK which can be found in the fresh produce section at [employer] or [employer] or [employer]  ++++++++++++++++++++++ DASH Eating Plan  DASH stands for "Dietary Approaches to Stop Hypertension."   The DASH eating plan is a healthy eating plan that has been shown to reduce high blood pressure (hypertension). Additional health benefits may include reducing the risk of type 2 diabetes mellitus, heart disease, and stroke. The DASH eating plan may also help with weight loss. WHAT DO I NEED TO KNOW ABOUT THE DASH EATING PLAN? For the DASH eating plan, you will follow these general  guidelines:  Choose foods with a percent daily value for sodium of less than 5% (as listed on the food label).  Use salt-free seasonings or herbs instead of table salt or sea salt.  Check with your health care provider or pharmacist before using salt substitutes.  Eat lower-sodium products, often labeled as "lower sodium" or "no salt added."  Eat fresh foods.  Eat more vegetables, fruits, and low-fat dairy products.  Choose whole grains. Look for the word "whole" as the first word in the ingredient list.  Choose fish   Limit sweets, desserts, sugars, and sugary drinks.  Choose heart-healthy fats.  Eat veggie cheese   Eat more home-cooked food and less restaurant, buffet, and fast food.  Limit fried foods.  Cook foods using methods other than frying.  Limit canned vegetables. If you do use them, rinse them well to decrease the sodium.  When eating at a restaurant, ask that your food be prepared with less salt, or no salt if possible.                      WHAT FOODS CAN I EAT? Read Dr Joel Fuhrman's books on The End of Dieting &   The End of Diabetes  Grains Whole grain or whole wheat bread. Brown rice. Whole grain or whole wheat pasta. Quinoa, bulgur, and whole grain cereals. Low-sodium cereals. Corn or whole wheat flour tortillas. Whole grain cornbread. Whole grain crackers. Low-sodium crackers.  Vegetables Fresh or frozen vegetables (raw, steamed, roasted, or grilled). Low-sodium or reduced-sodium tomato and vegetable juices. Low-sodium or reduced-sodium tomato sauce and paste. Low-sodium or reduced-sodium canned vegetables.   Fruits All fresh, canned (in natural juice), or frozen fruits.  Protein Products  All fish and seafood.  Dried beans, peas, or lentils. Unsalted nuts and seeds. Unsalted canned beans.  Dairy Low-fat dairy products, such as skim or 1% milk, 2% or reduced-fat cheeses, low-fat ricotta or cottage cheese, or plain low-fat yogurt. Low-sodium or  reduced-sodium cheeses.  Fats and Oils Tub margarines without trans fats. Light or reduced-fat mayonnaise and salad dressings (reduced sodium). Avocado. Safflower, olive, or canola oils. Natural peanut or almond butter.  Other Unsalted popcorn and pretzels. The items listed above may not be a complete list of recommended foods or beverages. Contact your dietitian for more options.  ++++++++++++++++++++  WHAT FOODS ARE NOT RECOMMENDED? Grains/ White flour or wheat flour White bread. White pasta. White rice. Refined cornbread. Bagels and croissants. Crackers that contain trans fat.  Vegetables  Creamed or fried vegetables. Vegetables in a . Regular canned vegetables. Regular canned tomato sauce and paste. Regular tomato and vegetable juices.  Fruits Dried fruits. Canned fruit in light or heavy syrup. Fruit juice.  Meat and Other Protein Products Meat in general - RED meat & White meat.  Fatty cuts of meat. Ribs, chicken wings, all processed meats as bacon, sausage, bologna, salami, fatback, hot dogs, bratwurst and packaged luncheon meats.  Dairy Whole or 2% milk, cream, half-and-half, and cream cheese. Whole-fat or sweetened yogurt. Full-fat cheeses or blue cheese. Non-dairy creamers and whipped toppings. Processed cheese, cheese spreads, or cheese curds.  Condiments Onion and garlic salt, seasoned salt, table salt, and sea salt. Canned and packaged gravies. Worcestershire sauce. Tartar sauce. Barbecue sauce. Teriyaki sauce. Soy sauce, including reduced sodium. Steak sauce. Fish sauce. Oyster sauce. Cocktail sauce. Horseradish. Ketchup and mustard. Meat flavorings and tenderizers. Bouillon cubes. Hot sauce. Tabasco sauce. Marinades. Taco seasonings. Relishes.  Fats and Oils Butter, stick margarine, lard, shortening and bacon fat. Coconut, palm kernel, or palm oils. Regular salad dressings.  Pickles and olives. Salted popcorn and pretzels.  The items listed above may not be a  complete list of foods and beverages to avoid.    

## 2019-02-20 NOTE — Progress Notes (Signed)
Annual  Screening/Preventative Visit  & Comprehensive Evaluation & Examination     This very nice 85 y.o. MWM presents for a Screening /Preventative Visit & comprehensive evaluation and management of multiple medical co-morbidities.  Patient has been followed for labile Supine HTN, Dysautonomia w/Postural Hypotension, HLD, ASCAD /CABG, T2_DM  and Vitamin D Deficiency. Patient was hospitalized in Feb with GIB due to 2 DU's and required Transfusion 2 u  pRBC's.      Labile HTN predates since 1995. Patient's BP has been controlled and titrated with Florinef relative to standing BP's by his wife.Today's BP is at goal - 138/68. In 2005 and 2011, he underwent PCA/Stents and then CABG in 2017.  In Sept 2019, patient had a Right Temporal stem CVA & was started on Plavix with his Low Dose bASA 81 mg.  Patient denies any cardiac symptoms as palpitations, shortness of breath, dizziness or ankle swelling. Patient does report exquisite anterior chest wall tenderness.     Patient alleges Statin Intolerance and his  hyperlipidemia is not controlled with diet and Zetia. Patient denies myalgias or other medication SE's. Last lipids were not at goal:  Lab Results  Component Value Date   CHOL 171 02/20/2019   HDL 32 (L) 02/20/2019   LDLCALC 104 (H) 02/20/2019   TRIG 230 (H) 02/20/2019   CHOLHDL 5.3 (H) 02/20/2019      Patient has hx/o prediabetes (2008) attempting diet controland patient denies reactive hypoglycemic symptoms, visual blurring, diabetic polys or paresthesias. Last A1c was not at goal:  Lab Results  Component Value Date   HGBA1C 6.3 (H) 02/20/2019       Finally, patient has history of Vitamin D Deficiency of    and last vitamin D was at goal:  Lab Results  Component Value Date   VD25OH 93 02/20/2019   Current Outpatient Medications on File Prior to Visit  Medication Sig   acetaminophen (TYLENOL) 500 MG tablet Take 1,000 mg by mouth 2 (two) times daily as needed for headache (pain).      Ascorbic Acid (VITAMIN C) 500 MG CAPS Take 1 capsule by mouth daily.   aspirin EC 81 MG tablet Take 1 tablet (81 mg total) by mouth daily.   butalbital-acetaminophen-caffeine (FIORICET) 50-325-40 MG tablet TAKE 1 TABLET EVERY 4 HOURS ONLY IF SEVERE HEADACHE   Calcium Carb-Cholecalciferol (CALCIUM+D3 PO) Take 1 tablet by mouth daily.   Cholecalciferol (VITAMIN D-3) 5000 units TABS Take 5,000 Units by mouth daily.   Ferrous Sulfate (IRON SLOW RELEASE) 140 (45 Fe) MG TBCR Take 1 tablet by mouth daily.   fludrocortisone (FLORINEF) 0.1 MG tablet TAKE 2 TABLETS BY MOUTH EVERY DAY (Patient taking differently: 0.05 mg daily. )   glucose blood test strip Check blood sugar 1 time daily-DX-E11.9   Magnesium 500 MG TABS Take 1 tablet by mouth daily.    Multiple Vitamin (MULTIVITAMIN WITH MINERALS) TABS tablet Take 1 tablet by mouth daily.   olmesartan (BENICAR) 40 MG tablet Take 20-40 mg by mouth as needed.   Omega-3 Fatty Acids (FISH OIL OMEGA-3 PO) Take 1 capsule by mouth daily.   oxyCODONE-acetaminophen (PERCOCET/ROXICET) 5-325 MG tablet Take 1 tablet by mouth every 6 (six) hours as needed for severe pain.   pantoprazole (PROTONIX) 40 MG tablet Take 1 tablet Daily for Acid Indigestion & Reflux   polyethylene glycol (MIRALAX / GLYCOLAX) packet Take 17 g by mouth daily. (Patient taking differently: Take 17 g by mouth 2 (two) times daily. )   POTASSIUM CHLORIDE  PO Take 595 mg by mouth daily.    vitamin B-12 (CYANOCOBALAMIN) 1000 MCG tablet Take 1,000 mcg by mouth daily.   Zinc 50 MG TABS Take 1 tablet by mouth daily.   No current facility-administered medications on file prior to visit.    Allergies  Allergen Reactions   Keflex [Cephalexin] Other (See Comments)    dizziness   Simvastatin Other (See Comments)    Joint pain   Sudafed [Pseudoephedrine] Other (See Comments)    Dizziness   Past Medical History:  Diagnosis Date   Aneurysm of iliac artery (HCC)    Colon polyps     Coronary atherosclerosis of unspecified type of vessel, native or graft    Diabetes ([place])    Difficult intubation    Esophageal reflux    High cholesterol    History of IBS 02/27/2009   Hypertension    pt denies, he says he has a h/o hypotension. If BP up he adjusts the Florinef   Orthostatic hypotension    "BP has been dropping alot when I stand up for the last month or so" (02/17/2016)   Vitamin B 12 deficiency    Vitamin D deficiency    Health Maintenance  Topic Date Due   INFLUENZA VACCINE  12/01/2018   OPHTHALMOLOGY EXAM  01/19/2019   HEMOGLOBIN A1C  08/21/2019   FOOT EXAM  02/20/2020   TETANUS/TDAP  10/02/2022   PNA vac Low Risk Adult  Completed   Immunization History  Administered Date(s) Administered   Influenza, High Dose Seasonal PF 12/30/2013, 02/02/2015, 01/27/2016   Influenza-Unspecified 01/13/2013, 01/29/2017, 01/30/2018   Pneumococcal Conjugate-13 02/14/2014   Pneumococcal-Unspecified 06/15/2004   Tetanus 10/01/2012   Zoster 10/01/2012   Last Colon - 11/24/10 - Dr Henrene Pastor - Upcoming EGD & Colon rescheduled x 2 due to Covid.   Past Surgical History:  Procedure Laterality Date   BIOPSY  06/25/2018   Procedure: BIOPSY;  Surgeon: Mansouraty, Telford Nab., MD;  Location: [place];  Service: Gastroenterology;;   BLADDER SURGERY  1969   traumatic pelvic fractures, urethral and bladder repair   CARDIAC CATHETERIZATION N/A 07/01/2015   Procedure: Left Heart Cath and Coronary Angiography;  Surgeon: Wellington Hampshire, MD;  Location: [place] [facility];  Service: Cardiovascular;  Laterality: N/A;   COLON RESECTION N/A 05/17/2017   Procedure: DIAGNOSTIC LAPAROSCOPY,;  Surgeon: Leighton Ruff, MD;  Location: [facility];  Service: General;  Laterality: N/A;   CORONARY ANGIOPLASTY WITH STENT PLACEMENT     CORONARY ARTERY BYPASS GRAFT N/A 07/06/2015   Procedure: CORONARY ARTERY BYPASS GRAFTING (CABG)x 4   utilizing the left internal mammary artery and  endoscopically harvested bilateral  sapheneous vein.;  Surgeon: Ivin Poot, MD;  Location: [place];  Service: Open Heart Surgery;  Laterality: N/A;   ESOPHAGOGASTRODUODENOSCOPY (EGD) WITH PROPOFOL N/A 06/25/2018   Procedure: ESOPHAGOGASTRODUODENOSCOPY (EGD) WITH PROPOFOL;  Surgeon: Rush Landmark Telford Nab., MD;  Location: [place];  Service: Gastroenterology;  Laterality: N/A;   KNEE SURGERY     LOOP RECORDER INSERTION N/A 02/19/2018   Procedure: LOOP RECORDER INSERTION;  Surgeon: Deboraha Sprang, MD;  Location: [place] [facility];  Service: Cardiovascular;  Laterality: N/A;   TEE WITHOUT CARDIOVERSION N/A 07/06/2015   Procedure: TRANSESOPHAGEAL ECHOCARDIOGRAM (TEE);  Surgeon: Ivin Poot, MD;  Location: [place];  Service: Open Heart Surgery;  Laterality: N/A;   Family History  Problem Relation Age of Onset   Heart attack Father        died age 61  Heart attack Brother        died age 55   Anuerysm Brother        aortic   Heart attack Sister        died age 72   Colon cancer Sister    Liver cancer Sister    Diabetes Maternal Grandmother    Arthritis Mother    Dementia Mother    Heart Problems Brother    Heart Problems Brother    Heart Problems Brother    Heart Problems Brother    Healthy Daughter    Healthy Son    Esophageal cancer Neg Hx    Inflammatory bowel disease Neg Hx    Pancreatic cancer Neg Hx    Stomach cancer Neg Hx    Social History   Socioeconomic History   Marital status: Married    Spouse name: Not on file   Number of children: 2   Highest education level: High school graduate  Occupational History   Occupation: Retired  Tobacco Use   Smoking status: Former Smoker    Quit date: 07/16/1963    Years since quitting: 55.6   Smokeless tobacco: Former Systems developer    Types: Chew    Quit date: 1989  Substance and Sexual Activity   Alcohol use: No   Drug use: No   Sexual activity: Not Currently  Social History Narrative    Lives at home with his wife Estill Bamberg   Right handed   No caffeine    ROS Constitutional: Denies fever, chills, weight loss/gain, headaches, insomnia,  night sweats or change in appetite. Does c/o fatigue. Eyes: Denies redness, blurred vision, diplopia, discharge, itchy or watery eyes.  ENT: Denies discharge, congestion, post nasal drip, epistaxis, sore throat, earache, hearing loss, dental pain, Tinnitus, Vertigo, Sinus pain or snoring.  Cardio: Denies chest pain, palpitations, irregular heartbeat, syncope, dyspnea, diaphoresis, orthopnea, PND, claudication or edema Respiratory: denies cough, dyspnea, DOE, pleurisy, hoarseness, laryngitis or wheezing.  Gastrointestinal: Denies dysphagia, heartburn, reflux, water brash, pain, cramps, nausea, vomiting, bloating, diarrhea, constipation, hematemesis, melena, hematochezia, jaundice or hemorrhoids Genitourinary: Denies dysuria, frequency, discharge, hematuria or flank pain. Has urgency, nocturia x 2-3 & occasional hesitancy. Musculoskeletal: Denies arthralgia, myalgia, stiffness, Jt. Swelling, pain, limp or strain/sprain. Denies Falls. Skin: Denies puritis, rash, hives, warts, acne, eczema or change in skin lesion Neuro: No weakness, tremor, incoordination, spasms, paresthesia or pain Psychiatric: Denies confusion, memory loss or sensory loss. Denies Depression. Endocrine: Denies change in weight, skin, hair change, nocturia, and paresthesia, diabetic polys, visual blurring or hyper / hypo glycemic episodes.  Heme/Lymph: No excessive bleeding, bruising or enlarged lymph nodes.  Physical Exam  BP 138/68    Pulse 64    Temp (!) 97 F (36.1 C)    Resp 16    Ht 6' (1.829 m)    Wt 183 lb (83 kg)    BMI 24.82 kg/m   General Appearance: Well nourished and well groomed and in no apparent distress.  Eyes: PERRLA, EOMs, conjunctiva no swelling or erythema, normal fundi and vessels. Sinuses: No frontal/maxillary tenderness ENT/Mouth: EACs patent / TMs  nl.  Nares clear without erythema, swelling, mucoid exudates. Oral hygiene is good. No erythema, swelling, or exudate. Tongue normal, non-obstructing. Tonsils not swollen or erythematous. Hearing normal.  Neck: Supple, thyroid not palpable. No bruits, nodes or JVD. Respiratory: Respiratory effort normal.  BS equal and clear bilateral without rales, rhonci, wheezing or stridor. Cardio: Heart sounds are normal with regular rate and rhythm and no  murmurs, rubs or gallops. Peripheral pulses are normal and equal bilaterally without edema. No aortic or femoral bruits. Chest: symmetric with normal excursions and percussion. Diffuse anterior chest wall tenderness to light palpitation / pressure.  Abdomen: Soft, with Nl bowel sounds. Nontender, no guarding, rebound, hernias, masses, or organomegaly.  Lymphatics: Non tender without lymphadenopathy.  Musculoskeletal: Full ROM all peripheral extremities, joint stability, 5/5 strength, and normal gait. Skin: Warm and dry without rashes, lesions, cyanosis, clubbing or  ecchymosis.  Neuro: Cranial nerves intact, reflexes equal bilaterally. Normal muscle tone, no cerebellar symptoms. Sensation intact.  Pysch: Alert and oriented X 3 with normal affect, insight and judgment appropriate.   Assessment and Plan  1. Annual Preventative/Screening Exam   2. Supine hypertension  - EKG 12-Lead - Urinalysis, Routine w reflex microscopic - CBC with Differential/Platelet - COMPLETE METABOLIC PANEL WITH GFR - Magnesium - TSH  3. Dysautonomia orthostatic hypotension syndrome ([place])  4. Chronic pain syndrome  - DULoxetine (CYMBALTA) 60 MG capsule; Take 1 capsule Daily for Chronic Pain  Dispense: 90 capsule; Refill: 3  - gabapentin (NEURONTIN) 600 MG tablet; Take 1/2    to   1   tablet 2 x /day for Chronic Pain  Dispense: 60 tablet; Refill: 3  5. Hyperlipidemia associated with type 2 diabetes mellitus (HCC)  - EKG 12-Lead - Lipid panel - TSH - Hemoglobin A1c -  Insulin, random  6. Controlled type 2 diabetes with neuropathy (HCC)  - Urinalysis, Routine w reflex microscopic - HM DIABETES FOOT EXAM - LOW EXTREMITY NEUR EXAM DOCUM - Hemoglobin A1c - Insulin, random  7. Hyperlipidemia, mixed  8. Vitamin D deficiency  - VITAMIN D 25 Hydroxyl  9. Coronary artery disease involving coronary bypass graft of native heart without angina pectoris  - EKG 12-Lead - Lipid panel  10. CKD stage 3 due to type 2 diabetes mellitus ([place])   11. BPH with obstruction/lower urinary tract symptoms  - Urinalysis, Routine w reflex microscopic - PSA  12. Prostate cancer screening  - PSA  13. Screening for colorectal cancer  - POC Hemoccult Bld/Stl  14. Screening for ischemic heart disease  - EKG 12-Lead  15. FHx: heart disease  - EKG 12-Lead  16. Former smoker  - EKG 12-Lead  17. Screening for AAA (aortic abdominal aneurysm)  18. Medication management  - Urinalysis, Routine w reflex microscopic - CBC with Differential/Platelet - COMPLETE METABOLIC PANEL WITH GFR - Magnesium - Lipid panel - TSH - Hemoglobin A1c - Insulin, random - VITAMIN D 25 Hydroxyl  19. History of lacunar cerebrovascular accident (CVA)           Patient was counseled in prudent diet, weight control to achieve/maintain BMI less than 25, BP monitoring, regular exercise and medications as discussed.  Discussed med effects and SE's. Routine screening labs and tests as requested with regular follow-up as recommended. Over 40 minutes of exam, counseling, chart review and high complex critical decision making was performed. Discussed with patient, an empiric trial on high dose prednisone for several days to assess the benefit for his chest wall pain & tenderness. Also will give a trial on Duloxetine & retry Gabapentin for the possible benefit for his chest wall tenderness and pain.    Kirtland Bouchard, MD

## 2019-02-21 ENCOUNTER — Telehealth: Payer: Self-pay | Admitting: *Deleted

## 2019-02-21 LAB — HEMOGLOBIN A1C
Hgb A1c MFr Bld: 6.3 % of total Hgb — ABNORMAL HIGH (ref <5.7)
Mean Plasma Glucose: 134 (calc)
eAG (mmol/L): 7.4 (calc)

## 2019-02-21 LAB — URINALYSIS, ROUTINE W REFLEX MICROSCOPIC
Bilirubin Urine: NEGATIVE
Glucose, UA: NEGATIVE
Hgb urine dipstick: NEGATIVE
Ketones, ur: NEGATIVE
Leukocytes,Ua: NEGATIVE
Nitrite: NEGATIVE
Protein, ur: NEGATIVE
Specific Gravity, Urine: 1.02 (ref 1.001–1.03)
pH: 6.5 (ref 5.0–8.0)

## 2019-02-21 LAB — CBC WITH DIFFERENTIAL/PLATELET
Absolute Monocytes: 590 cells/uL (ref 200–950)
Basophils Absolute: 47 cells/uL (ref 0–200)
Basophils Relative: 0.4 %
Eosinophils Absolute: 165 cells/uL (ref 15–500)
Eosinophils Relative: 1.4 %
HCT: 40.8 % (ref 38.5–50.0)
Hemoglobin: 13.3 g/dL (ref 13.2–17.1)
Lymphs Abs: 2714 cells/uL (ref 850–3900)
MCH: 32.2 pg (ref 27.0–33.0)
MCHC: 32.6 g/dL (ref 32.0–36.0)
MCV: 98.8 fL (ref 80.0–100.0)
MPV: 9.4 fL (ref 7.5–12.5)
Monocytes Relative: 5 %
Neutro Abs: 8284 cells/uL — ABNORMAL HIGH (ref 1500–7800)
Neutrophils Relative %: 70.2 %
Platelets: 211 10*3/uL (ref 140–400)
RBC: 4.13 10*6/uL — ABNORMAL LOW (ref 4.20–5.80)
RDW: 11.9 % (ref 11.0–15.0)
Total Lymphocyte: 23 %
WBC: 11.8 10*3/uL — ABNORMAL HIGH (ref 3.8–10.8)

## 2019-02-21 LAB — LIPID PANEL
Cholesterol: 171 mg/dL (ref <200)
HDL: 32 mg/dL — ABNORMAL LOW (ref >=40)
LDL Cholesterol (Calc): 104 mg/dL (calc) — ABNORMAL HIGH
Non-HDL Cholesterol (Calc): 139 mg/dL (calc) — ABNORMAL HIGH (ref <130)
Total CHOL/HDL Ratio: 5.3 (calc) — ABNORMAL HIGH (ref <5.0)
Triglycerides: 230 mg/dL — ABNORMAL HIGH (ref <150)

## 2019-02-21 LAB — COMPLETE METABOLIC PANEL WITH GFR
AG Ratio: 1.6 (calc) (ref 1.0–2.5)
ALT: 7 U/L — ABNORMAL LOW (ref 9–46)
AST: 12 U/L (ref 10–35)
Albumin: 3.9 g/dL (ref 3.6–5.1)
Alkaline phosphatase (APISO): 79 U/L (ref 35–144)
BUN/Creatinine Ratio: 21 (calc) (ref 6–22)
BUN: 28 mg/dL — ABNORMAL HIGH (ref 7–25)
CO2: 31 mmol/L (ref 20–32)
Calcium: 9.8 mg/dL (ref 8.6–10.3)
Chloride: 105 mmol/L (ref 98–110)
Creat: 1.31 mg/dL — ABNORMAL HIGH (ref 0.70–1.11)
GFR, Est African American: 58 mL/min/{1.73_m2} — ABNORMAL LOW (ref >=60)
GFR, Est Non African American: 50 mL/min/{1.73_m2} — ABNORMAL LOW (ref >=60)
Globulin: 2.5 g/dL (calc) (ref 1.9–3.7)
Glucose, Bld: 134 mg/dL — ABNORMAL HIGH (ref 65–99)
Potassium: 5.1 mmol/L (ref 3.5–5.3)
Sodium: 145 mmol/L (ref 135–146)
Total Bilirubin: 0.5 mg/dL (ref 0.2–1.2)
Total Protein: 6.4 g/dL (ref 6.1–8.1)

## 2019-02-21 LAB — PSA: PSA: 1.1 ng/mL (ref <=4.0)

## 2019-02-21 LAB — VITAMIN D 25 HYDROXY (VIT D DEFICIENCY, FRACTURES): Vit D, 25-Hydroxy: 93 ng/mL (ref 30–100)

## 2019-02-21 LAB — TSH: TSH: 2.35 mIU/L (ref 0.40–4.50)

## 2019-02-21 LAB — MAGNESIUM: Magnesium: 2.3 mg/dL (ref 1.5–2.5)

## 2019-02-21 LAB — INSULIN, RANDOM: Insulin: 8.4 u[IU]/mL

## 2019-02-21 NOTE — Telephone Encounter (Signed)
Called pt and lvm #1 

## 2019-02-21 NOTE — Telephone Encounter (Signed)
Received DEXA results from [facility]  Date of Scan: 02/18/19 Lowest T-score and site measured: -2.5 Right Femoral Neck Significant changes in BMD and site measured (5% and above):-10 % Left Femur Neck  Current Regimen:No Current Treatment  Recommendation:Schedule appointment to discuss treatment options. Patient has a follow up appointment 02/27/19. Noted appointment to discuss results. Left message for patient to call the office to advise.

## 2019-02-21 NOTE — Telephone Encounter (Signed)
[id number] Destruction by neurolytic agent, paraver more  Notification/Prior Authorization not required if procedure performed in Office; otherwise may be required for this service.  64636 Destruction by neurolytic agent, paraver more  Notification/Prior Authorization not required for this service.

## 2019-02-23 ENCOUNTER — Encounter: Payer: Self-pay | Admitting: Internal Medicine

## 2019-02-24 LAB — CUP PACEART REMOTE DEVICE CHECK
Date Time Interrogation Session: 20201025100727
Implantable Pulse Generator Implant Date: 20191021

## 2019-02-25 ENCOUNTER — Ambulatory Visit (INDEPENDENT_AMBULATORY_CARE_PROVIDER_SITE_OTHER): Payer: Medicare Other | Admitting: *Deleted

## 2019-02-25 ENCOUNTER — Encounter: Payer: Self-pay | Admitting: *Deleted

## 2019-02-25 ENCOUNTER — Telehealth: Payer: Self-pay | Admitting: Student

## 2019-02-25 ENCOUNTER — Telehealth: Payer: Self-pay | Admitting: Gastroenterology

## 2019-02-25 ENCOUNTER — Other Ambulatory Visit: Payer: Self-pay | Admitting: Physician Assistant

## 2019-02-25 DIAGNOSIS — I639 Cerebral infarction, unspecified: Secondary | ICD-10-CM

## 2019-02-25 DIAGNOSIS — D509 Iron deficiency anemia, unspecified: Secondary | ICD-10-CM

## 2019-02-25 NOTE — Telephone Encounter (Signed)
Carelink alert for tachy episodes. 1 available EGM shows tachycardia as below.  He does not remember the episode in question. He denies symptoms of chest pain, palpitations, shortness of breath, or syncope.    He does have dark stools after recently being started on Iron, and is pending further labwork/work up.   No further questions at this time.    Pt will send manual transmission.

## 2019-02-25 NOTE — Telephone Encounter (Signed)
[name], Please have the patient come in this week for a repeat hemoglobin and CMP. If he still having issues please have him come into the office and be seen by one of the PAs since I am in the hospital this week. He had postponed his most recent procedures as documented in the system. Maybe we just need to try and get those put back into the system and try and get them scheduled up earlier. Thank you. GM

## 2019-02-25 NOTE — Telephone Encounter (Signed)
For whatever reason, he seems to have recently transitioned his care to Dr. Rush Landmark.  Given the issues may be seemingly related to his evaluations, and for the purposes of continuity care, I will defer to Dr. Lia Foyer in this instance.  Thanks

## 2019-02-25 NOTE — Telephone Encounter (Signed)
I spoke with the pt's wife and she tells me that the pt has black stools that started about the time he began taking iron.  He does have somewhat recent history of multiple duodenal ulcers.  She says his abd discomfort is the same that he has had for some time and not any worse since he began having the black stools.  No other symptoms noted.  He was seen by PCP on 10/21 and stool cards were completed and mailed on Friday. He also had normal Hgb of 13.3 on Friday.   I asked the pt's wife to watch the pt and we will see what the stool cards reveal.  I will also send to Dr Rush Landmark for review.  Also, Dr Rush Landmark and Dr Henrene Pastor  I see that this was originally a Dr Henrene Pastor patient.  Has his care been transferred?

## 2019-02-25 NOTE — Telephone Encounter (Signed)
The pt's daughter has been advised and he will come in this week for labs.  The order is in [place]

## 2019-02-26 NOTE — Telephone Encounter (Signed)
Transmission reviewed  Pt had 8 episodes of this tachycardia. Four on 10/22 and four 10/25. Longest episode 3 minutes 26 seconds.  Episode 11 and several others show gradual onset as below. Others show episode in progress, and rate appears to be hovering along detection line of >146 for 16 beats. Oversensing of noise also noted, as in Episode 17 below.   As below, pt asymptomatic. Will forward to Dr. Caryl Comes for further, if warranted.   Legrand Como [address]" [place], PA-C  02/26/2019 3:09 PM      ...    Episode 17 (Longest)

## 2019-02-27 ENCOUNTER — Encounter: Payer: Self-pay | Admitting: Rheumatology

## 2019-02-27 ENCOUNTER — Ambulatory Visit: Payer: Medicare Other | Admitting: Internal Medicine

## 2019-02-27 ENCOUNTER — Ambulatory Visit (INDEPENDENT_AMBULATORY_CARE_PROVIDER_SITE_OTHER): Payer: Medicare Other | Admitting: Rheumatology

## 2019-02-27 ENCOUNTER — Telehealth: Payer: Self-pay

## 2019-02-27 ENCOUNTER — Other Ambulatory Visit: Payer: Self-pay | Admitting: *Deleted

## 2019-02-27 ENCOUNTER — Other Ambulatory Visit: Payer: Self-pay

## 2019-02-27 VITALS — BP 131/79 | HR 77 | Resp 14 | Ht 71.0 in | Wt 180.0 lb

## 2019-02-27 VITALS — BP 146/84 | HR 80 | Temp 97.2°F | Resp 16 | Ht 71.0 in | Wt 182.6 lb

## 2019-02-27 DIAGNOSIS — I714 Abdominal aortic aneurysm, without rupture, unspecified: Secondary | ICD-10-CM

## 2019-02-27 DIAGNOSIS — M810 Age-related osteoporosis without current pathological fracture: Secondary | ICD-10-CM

## 2019-02-27 DIAGNOSIS — M5134 Other intervertebral disc degeneration, thoracic region: Secondary | ICD-10-CM | POA: Diagnosis not present

## 2019-02-27 DIAGNOSIS — R079 Chest pain, unspecified: Secondary | ICD-10-CM

## 2019-02-27 DIAGNOSIS — Z951 Presence of aortocoronary bypass graft: Secondary | ICD-10-CM

## 2019-02-27 DIAGNOSIS — M40294 Other kyphosis, thoracic region: Secondary | ICD-10-CM | POA: Diagnosis not present

## 2019-02-27 DIAGNOSIS — K219 Gastro-esophageal reflux disease without esophagitis: Secondary | ICD-10-CM

## 2019-02-27 DIAGNOSIS — I739 Peripheral vascular disease, unspecified: Secondary | ICD-10-CM

## 2019-02-27 DIAGNOSIS — G903 Multi-system degeneration of the autonomic nervous system: Secondary | ICD-10-CM | POA: Diagnosis not present

## 2019-02-27 DIAGNOSIS — I10 Essential (primary) hypertension: Secondary | ICD-10-CM

## 2019-02-27 DIAGNOSIS — E785 Hyperlipidemia, unspecified: Secondary | ICD-10-CM

## 2019-02-27 DIAGNOSIS — Z8673 Personal history of transient ischemic attack (TIA), and cerebral infarction without residual deficits: Secondary | ICD-10-CM

## 2019-02-27 DIAGNOSIS — G894 Chronic pain syndrome: Secondary | ICD-10-CM | POA: Diagnosis not present

## 2019-02-27 DIAGNOSIS — Z8781 Personal history of (healed) traumatic fracture: Secondary | ICD-10-CM

## 2019-02-27 DIAGNOSIS — M19012 Primary osteoarthritis, left shoulder: Secondary | ICD-10-CM

## 2019-02-27 DIAGNOSIS — Z1211 Encounter for screening for malignant neoplasm of colon: Secondary | ICD-10-CM

## 2019-02-27 DIAGNOSIS — M7918 Myalgia, other site: Secondary | ICD-10-CM

## 2019-02-27 DIAGNOSIS — E1169 Type 2 diabetes mellitus with other specified complication: Secondary | ICD-10-CM

## 2019-02-27 DIAGNOSIS — M5136 Other intervertebral disc degeneration, lumbar region: Secondary | ICD-10-CM

## 2019-02-27 DIAGNOSIS — M19011 Primary osteoarthritis, right shoulder: Secondary | ICD-10-CM

## 2019-02-27 DIAGNOSIS — E1122 Type 2 diabetes mellitus with diabetic chronic kidney disease: Secondary | ICD-10-CM

## 2019-02-27 DIAGNOSIS — M170 Bilateral primary osteoarthritis of knee: Secondary | ICD-10-CM

## 2019-02-27 DIAGNOSIS — I951 Orthostatic hypotension: Secondary | ICD-10-CM

## 2019-02-27 DIAGNOSIS — N183 Chronic kidney disease, stage 3 unspecified: Secondary | ICD-10-CM

## 2019-02-27 DIAGNOSIS — Z1212 Encounter for screening for malignant neoplasm of rectum: Secondary | ICD-10-CM

## 2019-02-27 LAB — POC HEMOCCULT BLD/STL (HOME/3-CARD/SCREEN)
Card #2 Fecal Occult Blod, POC: NEGATIVE
Card #3 Fecal Occult Blood, POC: NEGATIVE
Fecal Occult Blood, POC: NEGATIVE

## 2019-02-27 NOTE — Telephone Encounter (Signed)
Submitted a Prior Authorization request to [facility] for [facility] via Cover My Meds. Will update once we receive a response.  12:37 PM Beatriz Chancellor, CPhT

## 2019-02-27 NOTE — Telephone Encounter (Signed)
Received notification from [facility] regarding a prior authorization for [facility]. Authorization has been APPROVED from 02/27/2019 to 05/02/2019.   Will send document to scan center.  Authorization # [id number] Phone # [phone]

## 2019-02-27 NOTE — Telephone Encounter (Signed)
Please apply for [facility] or Tymlos. Per Dr. Estanislado Pandy.   Thanks!

## 2019-02-27 NOTE — Progress Notes (Signed)
Subjective:    Patient ID: [name], male    DOB: 1933-07-04, 85 y.o.   MRN: [id number]  HPI     This 85 y.o. MWM followed for labile Supine HTN, Dysautonomia w/Postural Hypotension, HLD, ASCAD /CABG, ASCVD/Hx CVA's, T2_DM, Vitamin D Deficiency and recently hospitalized w / acute GI bleed requiring 2 units pRBC transfusion.  Patient also returns for fu after empiric trial of high dose prednisone for his chest wall pains. Patient had near immediate improvement in his chest wall pa He does have ongoing LBP for which he is followed by Dr Louanne Skye.       Outpatient Medications Prior to Visit  Medication Sig Dispense Refill  . acetaminophen (TYLENOL) 500 MG tablet Take 1,000 mg by mouth 2 (two) times daily as needed for headache (pain).     . Ascorbic Acid (VITAMIN C) 500 MG CAPS Take 1 capsule by mouth daily.    Marland Kitchen aspirin EC 81 MG tablet Take 1 tablet (81 mg total) by mouth daily.    . butalbital-acetaminophen-caffeine (FIORICET) 50-325-40 MG tablet TAKE 1 TABLET EVERY 4 HOURS ONLY IF SEVERE HEADACHE 30 tablet 0  . Calcium Carb-Cholecalciferol (CALCIUM+D3 PO) Take 1 tablet by mouth daily.    . Cholecalciferol (VITAMIN D-3) 5000 units TABS Take 5,000 Units by mouth daily.    . DULoxetine (CYMBALTA) 60 MG capsule Take 1 capsule Daily for Chronic Pain 90 capsule 3  . Ferrous Sulfate (IRON SLOW RELEASE) 140 (45 Fe) MG TBCR Take 1 tablet by mouth daily.    . fludrocortisone (FLORINEF) 0.1 MG tablet TAKE 2 TABLETS BY MOUTH EVERY DAY 180 tablet 1  . gabapentin (NEURONTIN) 600 MG tablet Take 1/2    to   1   tablet 2 x /day for Chronic Pain 60 tablet 3  . glucose blood test strip Check blood sugar 1 time daily-DX-E11.9 100 each 5  . Magnesium 500 MG TABS Take 1 tablet by mouth daily.     . Multiple Vitamin (MULTIVITAMIN WITH MINERALS) TABS tablet Take 1 tablet by mouth daily.    Marland Kitchen olmesartan (BENICAR) 40 MG tablet Take 20-40 mg by mouth as needed.    . Omega-3 Fatty Acids (FISH OIL OMEGA-3 PO) Take  1 capsule by mouth daily.    . pantoprazole (PROTONIX) 40 MG tablet Take 1 tablet Daily for Acid Indigestion & Reflux 90 tablet 1  . polyethylene glycol (MIRALAX / GLYCOLAX) packet Take 17 g by mouth daily. (Patient taking differently: Take 17 g by mouth 2 (two) times daily. ) 28 each 1  . POTASSIUM CHLORIDE PO Take 595 mg by mouth daily.     . vitamin B-12 (CYANOCOBALAMIN) 1000 MCG tablet Take 1,000 mcg by mouth daily.    . Zinc 50 MG TABS Take 1 tablet by mouth daily.    Marland Kitchen oxyCODONE-acetaminophen (PERCOCET/ROXICET) 5-325 MG tablet Take 1 tablet by mouth every 6 (six) hours as needed for severe pain. 30 tablet 0  . predniSONE (DELTASONE) 50 MG tablet Take 2 tablets now, then take 1 tablet 2 x  /day 16 tablet 0   No facility-administered medications prior to visit.    Allergies  Allergen Reactions  . Cymbalta [Duloxetine Hcl] Other (See Comments)    Dizziness, hallucinations.   . Keflex [Cephalexin] Other (See Comments)    dizziness  . Simvastatin Other (See Comments)    Joint pain  . Sudafed [Pseudoephedrine] Other (See Comments)    Dizziness   Past Medical History:  Diagnosis Date  .  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 Colon polyps   . Coronary atherosclerosis of unspecified type of vessel, native or graft   . Diabetes ([place])   . Difficult intubation   . Esophageal reflux   . High cholesterol   . History of IBS 02/27/2009  . Hypertension    pt denies, he says he has a h/o hypotension. If BP up he adjusts the Florinef  . Orthostatic hypotension    "BP has been dropping alot when I stand up for the last month or so" (02/17/2016)  . Vitamin B 12 deficiency   . Vitamin D deficiency    Past Surgical History:  Procedure Laterality Date  . BIOPSY  06/25/2018   Procedure: BIOPSY;  Surgeon: Rush Landmark Telford Nab., MD;  Location: [place];  Service: Gastroenterology;;  . BLADDER SURGERY  1969   traumatic pelvic fractures, urethral and bladder repair  . CARDIAC CATHETERIZATION  N/A 07/01/2015   Procedure: Left Heart Cath and Coronary Angiography;  Surgeon: Wellington Hampshire, MD;  Location: [place] [facility];  Service: Cardiovascular;  Laterality: N/A;  . COLON RESECTION N/A 05/17/2017   Procedure: DIAGNOSTIC LAPAROSCOPY,;  Surgeon: Leighton Ruff, MD;  Location[facility];  Service: General;  Laterality: N/A;  . CORONARY ANGIOPLASTY WITH STENT PLACEMENT    . CORONARY ARTERY BYPASS GRAFT N/A 07/06/2015   Procedure: CORONARY ARTERY BYPASS GRAFTING (CABG)x 4   utilizing the left internal mammary artery and endoscopically harvested bilateral  sapheneous vein.;  Surgeon: Ivin Poot, MD;  Location: [place];  Service: Open Heart Surgery;  Laterality: N/A;  . ESOPHAGOGASTRODUODENOSCOPY (EGD) WITH PROPOFOL N/A 06/25/2018   Procedure: ESOPHAGOGASTRODUODENOSCOPY (EGD) WITH PROPOFOL;  Surgeon: Rush Landmark Telford Nab., MD;  Location: [place];  Service: Gastroenterology;  Laterality: N/A;  . KNEE SURGERY    . LOOP RECORDER INSERTION N/A 02/19/2018   Procedure: LOOP RECORDER INSERTION;  Surgeon: Deboraha Sprang, MD;  Location: [place] [facility];  Service: Cardiovascular;  Laterality: N/A;  . TEE WITHOUT CARDIOVERSION N/A 07/06/2015   Procedure: TRANSESOPHAGEAL ECHOCARDIOGRAM (TEE);  Surgeon: Ivin Poot, MD;  Location: [place];  Service: Open Heart Surgery;  Laterality: N/A;   Review of Systems     10 point systems review negative except as above.    Objective:   Physical Exam  BP (!) 146/84   Pulse 80   Temp (!) 97.2 F (36.2 C)   Resp 16   Ht 5\' 11"  (1.803 m)   Wt 182 lb 9.6 oz (82.8 kg)   BMI 25.47 kg/m   Postural        Sit BP 172/104       P 81       &       Stand BP 107/63     P 86  HEENT - WNL. Neck - supple.  Chest - Clear equal BS. Cor - Nl HS. RRR w/o sig MGR. PP 1(+). No edema. MS- FROM w/o deformities.  Gait Nl. Neuro -  Nl w/o focal abnormalities.    Assessment & Plan:    1. Chest pain, unspecified   2. Supine hypertension - discussed rapid taper  of Steroids   3. Dysautonomia orthostatic hypotension syndrome ([place])  4. Chronic pain syndrome

## 2019-02-27 NOTE — Telephone Encounter (Signed)
Patient seen by Dr. Estanislado Pandy today.   Patient has had 3 falls since 02/21/2019, he needs an appointment asap with Dr. Louanne Skye (he is an existing patient). He is experiencing weakness in lower extremities.   Thanks!

## 2019-02-27 NOTE — Telephone Encounter (Signed)
Ran test claim for 1 month supply of Forteo. Patient's copay is $375.16. He is in the coverage gap. Patient can apply for PAP through [facility]  1:33 PM Beatriz Chancellor, CPhT

## 2019-02-27 NOTE — Telephone Encounter (Signed)
sched for 02/28/2019 @ 130

## 2019-02-28 ENCOUNTER — Ambulatory Visit: Payer: Medicare Other | Admitting: Specialist

## 2019-02-28 ENCOUNTER — Encounter: Payer: Self-pay | Admitting: Specialist

## 2019-02-28 ENCOUNTER — Other Ambulatory Visit: Payer: Self-pay | Admitting: Internal Medicine

## 2019-02-28 VITALS — BP 127/76 | HR 84 | Ht 71.0 in | Wt 180.0 lb

## 2019-02-28 DIAGNOSIS — M48062 Spinal stenosis, lumbar region with neurogenic claudication: Secondary | ICD-10-CM

## 2019-02-28 DIAGNOSIS — M4726 Other spondylosis with radiculopathy, lumbar region: Secondary | ICD-10-CM

## 2019-02-28 DIAGNOSIS — M4156 Other secondary scoliosis, lumbar region: Secondary | ICD-10-CM | POA: Diagnosis not present

## 2019-02-28 MED ORDER — PREDNISONE 5 MG PO TABS: ORAL_TABLET | ORAL | 3 refills | Status: DC

## 2019-02-28 NOTE — Progress Notes (Addendum)
Office Visit Note   Patient: [name]           Date of Birth: 1933-12-10           MRN: [id number] Visit Date: 02/28/2019              Requested by: [name], [address],  [place] [address] PCP: [name], MD   Assessment & Plan: Visit Diagnoses:  1. Spinal stenosis of lumbar region with neurogenic claudication   2. Other secondary scoliosis, lumbar region   3. Other spondylosis with radiculopathy, lumbar region     Plan: Avoid bending, stooping and avoid lifting weights greater than 10 lbs. Avoid prolong standing and walking. Avoid frequent bending and stooping  No lifting greater than 10 lbs. May use ice or moist heat for pain. Weight loss is of benefit. Handicap license is approved. Dr. Romona Curls secretary/Assistant will call to arrange for radiofrequency ablation.  Follow-Up Instructions: Return in about 3 weeks (around 03/21/2019).   Orders:  No orders of the defined types were placed in this encounter.  No orders of the defined types were placed in this encounter.     Procedures: No procedures performed   Clinical Data: No additional findings.   Subjective: Chief Complaint  Patient presents with  . Lower Back - Follow-up    85 year old male with history of severe arthrosis pain and cervicalgia. He is having pain in the thoracic area and across the upper abdomen and hypogastrum. He has tried cymbalta for the arthritis pain and unfortunately it caused hallucination and he was off balance and fell, hitting his left posterior thorax against a door sill. He has some abrasions linear left paradorsal area.    Review of Systems   Objective: Vital Signs: BP 127/76 (BP Location: Left Arm, Patient Position: Sitting)   Pulse 84   Ht 5\' 11"  (1.803 m)   Wt 180 lb (81.6 kg)   BMI 25.10 kg/m   Physical Exam  Ortho Exam  Specialty Comments:  No specialty comments available.  Imaging: No results found.    PMFS History: Patient Active Problem List   Diagnosis Date Noted  . Chronic bilateral low back pain without sciatica 02/07/2019  . Statin intolerance 11/16/2018  . 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 Hyperlipidemia, mixed 08/15/2018  . Low back pain 07/18/2018  . Acute blood loss anemia 06/24/2018  . Upper GI bleed 06/24/2018  . History of lacunar cerebrovascular accident (CVA) 02/12/2018  . Dizziness 02/12/2018  . Myofascial pain 12/18/2017  . Spondylosis without myelopathy or radiculopathy, lumbar region 12/18/2017  . Lumbar radiculopathy, right 12/18/2017  . Cholelithiasis 05/17/2017  . Sinus Bradycardia 05/17/2017  . Thoracic radiculopathy 04/12/2017  . Primary osteoarthritis of right knee 12/14/2016  . DDD (degenerative disc disease), lumbar 10/10/2016  . Lumbar facet arthropathy 08/29/2016  . Idiopathic scoliosis 03/22/2016  . Diabetic neuropathy ([place]) 03/22/2016  . Dysautonomia orthostatic hypotension syndrome ([place]) 02/25/2016  . CKD stage 3 due to type 2 diabetes mellitus ([place]) 02/19/2016  . Coronary artery disease involving coronary bypass graft of native heart without angina pectoris   . Diabetes mellitus type 2, diet-controlled ([place])   . 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 Medication management 08/11/2015  . 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 PVD (peripheral vascular disease) ([place]) 01/01/2014  . Hyperlipidemia associated with type 2 diabetes mellitus ([place]) 04/11/2013  . Supine hypertension   . Vitamin D deficiency   . Vitamin B 12 deficiency   .  Coronary atherosclerosis- s/p PCI to LAD in 2009 and PCI to RCA in 2011 02/27/2009  . Abdominal aortic aneurysm ([place]) 02/27/2009  . GERD 02/27/2009   Past Medical History:  Diagnosis Date  . 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 Colon polyps   . Coronary atherosclerosis of unspecified type of vessel, native or graft   . Diabetes ([place])   . Difficult intubation   . Esophageal reflux   . High cholesterol   . History of IBS  02/27/2009  . Hypertension    pt denies, he says he has a h/o hypotension. If BP up he adjusts the Florinef  . Orthostatic hypotension    "BP has been dropping alot when I stand up for the last month or so" (02/17/2016)  . Vitamin B 12 deficiency   . Vitamin D deficiency     Family History  Problem Relation Age of Onset  . Heart attack Father        died age 90  . Heart attack Brother        died age 37  . Anuerysm Brother        aortic  . Heart attack Sister        died age 68  . Colon cancer Sister   . Liver cancer Sister   . Diabetes Maternal Grandmother   . Arthritis Mother   . Dementia Mother   . Heart Problems Brother   . Heart Problems Brother   . Heart Problems Brother   . Heart Problems Brother   . Healthy Daughter   . Healthy Son   . Esophageal cancer Neg Hx   . Inflammatory bowel disease Neg Hx   . Pancreatic cancer Neg Hx   . Stomach cancer Neg Hx     Past Surgical History:  Procedure Laterality Date  . BIOPSY  06/25/2018   Procedure: BIOPSY;  Surgeon: Rush Landmark Telford Nab., MD;  Location: [facility];  Service: Gastroenterology;;  . BLADDER SURGERY  1969   traumatic pelvic fractures, urethral and bladder repair  . CARDIAC CATHETERIZATION N/A 07/01/2015   Procedure: Left Heart Cath and Coronary Angiography;  Surgeon: Wellington Hampshire, MD;  Location: [facility];  Service: Cardiovascular;  Laterality: N/A;  . COLON RESECTION N/A 05/17/2017   Procedure: DIAGNOSTIC LAPAROSCOPY,;  Surgeon: Leighton Ruff, MD;  Location: [facility];  Service: General;  Laterality: N/A;  . CORONARY ANGIOPLASTY WITH STENT PLACEMENT    . CORONARY ARTERY BYPASS GRAFT N/A 07/06/2015   Procedure: CORONARY ARTERY BYPASS GRAFTING (CABG)x 4   utilizing the left internal mammary artery and endoscopically harvested bilateral  sapheneous vein.;  Surgeon: Ivin Poot, MD;  Location: [facility];  Service: Open Heart Surgery;  Laterality: N/A;  . ESOPHAGOGASTRODUODENOSCOPY (EGD) WITH PROPOFOL  N/A 06/25/2018   Procedure: ESOPHAGOGASTRODUODENOSCOPY (EGD) WITH PROPOFOL;  Surgeon: Rush Landmark Telford Nab., MD;  Location: [facility];  Service: Gastroenterology;  Laterality: N/A;  . KNEE SURGERY    . LOOP RECORDER INSERTION N/A 02/19/2018   Procedure: LOOP RECORDER INSERTION;  Surgeon: Deboraha Sprang, MD;  Location: [facility];  Service: Cardiovascular;  Laterality: N/A;  . TEE WITHOUT CARDIOVERSION N/A 07/06/2015   Procedure: TRANSESOPHAGEAL ECHOCARDIOGRAM (TEE);  Surgeon: Ivin Poot, MD;  Location: [facility];  Service: Open Heart Surgery;  Laterality: N/A;   Social History   Occupational History  . Occupation: Retired  Tobacco Use  . Smoking status: Former Smoker    Quit date: 07/16/1963    Years  since quitting: 55.6  . Smokeless tobacco: Former Systems developer    Types: Chew    Quit date: 1989  Substance and Sexual Activity  . Alcohol use: No  . Drug use: No  . Sexual activity: Not Currently

## 2019-02-28 NOTE — Patient Instructions (Signed)
Avoid bending, stooping and avoid lifting weights greater than 10 lbs. Avoid prolong standing and walking. Avoid frequent bending and stooping  No lifting greater than 10 lbs. May use ice or moist heat for pain. Weight loss is of benefit. Handicap license is approved. Dr. Romona Curls secretary/Assistant will call to arrange for radiofrequency ablation.

## 2019-02-28 NOTE — Telephone Encounter (Signed)
I saw [name] today, he apparently was hallucinating and fell due to cymbalta meds. He is weak and the only surgery I can consider is a minimum 2 level lumbar interbody fusion and possibly as much as a 4 level decompression and fusion of the lumbar spine. He is 36 has leg weakness but history of CAD with CABG and stents, stroke and Stage 3 CKD makes him a high risk surgical candidate. Hopefully discontinuing the cymbalta will stop the falls. He wants to try the  RFA and I have explained it will not give him leg strength but may help with primary lumbar pain. If he is not satisfied with the results of RFA then we will revisit Considering a lumbar fusion and the risks associated with it.

## 2019-03-01 LAB — PROTEIN ELECTROPHORESIS, SERUM, WITH REFLEX
Albumin ELP: 3.6 g/dL — ABNORMAL LOW (ref 3.8–4.8)
Alpha 1: 0.3 g/dL (ref 0.2–0.3)
Alpha 2: 0.9 g/dL (ref 0.5–0.9)
Beta 2: 0.3 g/dL (ref 0.2–0.5)
Beta Globulin: 0.4 g/dL (ref 0.4–0.6)
Gamma Globulin: 0.6 g/dL — ABNORMAL LOW (ref 0.8–1.7)
Total Protein: 6.1 g/dL (ref 6.1–8.1)

## 2019-03-01 LAB — TSH: TSH: 0.86 mIU/L (ref 0.40–4.50)

## 2019-03-01 LAB — PARATHYROID HORMONE, INTACT (NO CA): PTH: 18 pg/mL (ref 14–64)

## 2019-03-01 LAB — PHOSPHORUS: Phosphorus: 3.5 mg/dL (ref 2.1–4.3)

## 2019-03-01 LAB — VITAMIN D 25 HYDROXY (VIT D DEFICIENCY, FRACTURES): Vit D, 25-Hydroxy: 85 ng/mL (ref 30–100)

## 2019-03-01 LAB — TESTOSTERONE: Testosterone: 46 ng/dL — ABNORMAL LOW (ref 250–827)

## 2019-03-01 NOTE — Progress Notes (Signed)
Patient was recently seen in the office.  My plan is to start him on Forteo or Tymlos due to history of vertebral fractures and osteoporosis.  The screening labs showed low testosterone.

## 2019-03-02 ENCOUNTER — Encounter: Payer: Self-pay | Admitting: Internal Medicine

## 2019-03-04 NOTE — Telephone Encounter (Signed)
Lets reprogram his rate to 20 bpm slower This could be sinus tach v atrial tack   Its hard to feel good about calling it sinus tach at 160 in 85 yo man whose predicted max should be ( obvioulsy part of a bell shaped curve) 140

## 2019-03-04 NOTE — Telephone Encounter (Signed)
Discussed with Dr. Estanislado Pandy. Ok to proceed with Reclast IV infusions if the patient is ok with it.

## 2019-03-04 NOTE — Telephone Encounter (Signed)
Patient's wife told me that he had side effects from Cymbalta , which he discontinued. All the three falls happened after discontinuing Cymbalta according to his wife and the patient falls happened because his legs gave out. That was the reason, I advised them to schedule an urgent appointment with you. Thank you for evaluating him on a short notice.

## 2019-03-04 NOTE — Telephone Encounter (Signed)
Called to notify patient of approval and co-pay.  Spoke with wife Estill Bamberg.  She states they are unable to afford the monthly co-pay requirements.  Discussed patient assistance through [employer] and the income requirements of less than $52,000 a year for a 2 person household.  She states they would not qualify for patient assistance.  Tymlos is not an option as it is not approved for the treatment of osteoporosis in men. She is interested in an oral bisphosphonate.  Oral bisphosphonate would be covered under his insurance but he has a history of GERD. Patient would be a good candidate for Reclast and Medicare Part B would cover 80% of infusion.  Prolia could also be an option.  Please advise which treatment option you would like to move forward with next.  Mariella Saa, PharmD, [place], [place] Clinical Specialty Pharmacist [phone]  03/04/2019 10:08 AM

## 2019-03-05 NOTE — Telephone Encounter (Signed)
Discussed with Wife. Per Dr. Caryl Comes, would like to reprogram device.    Scheduled for this Thursday at 1030 for reprogramming.   Legrand Como [address] [place], PA-C  03/05/2019 9:37 AM

## 2019-03-05 NOTE — Telephone Encounter (Signed)
Thank you for informing me. We will discuss at follow up visit on 05/30/19.

## 2019-03-07 ENCOUNTER — Other Ambulatory Visit: Payer: Self-pay

## 2019-03-07 ENCOUNTER — Ambulatory Visit (INDEPENDENT_AMBULATORY_CARE_PROVIDER_SITE_OTHER): Payer: Medicare Other | Admitting: *Deleted

## 2019-03-07 DIAGNOSIS — I639 Cerebral infarction, unspecified: Secondary | ICD-10-CM

## 2019-03-07 LAB — CUP PACEART INCLINIC DEVICE CHECK
Date Time Interrogation Session: 20201105114452
Implantable Pulse Generator Implant Date: 20191021

## 2019-03-07 NOTE — Progress Notes (Addendum)
Loop check in clinic. Battery status: Good. R-waves mV. 0 symptom episodes, 0 tachy episodes tachy detection rate decreased per Dr Caryl Comes to 128 bpm to attempt to record SVT at slower rates. 0 pause episodes, 0 brady episodes. 2 AF episodes that appear to be AT.

## 2019-03-11 ENCOUNTER — Other Ambulatory Visit: Payer: Self-pay | Admitting: Internal Medicine

## 2019-03-13 ENCOUNTER — Telehealth: Payer: Self-pay | Admitting: Gastroenterology

## 2019-03-13 ENCOUNTER — Other Ambulatory Visit: Payer: Self-pay

## 2019-03-13 ENCOUNTER — Other Ambulatory Visit (INDEPENDENT_AMBULATORY_CARE_PROVIDER_SITE_OTHER): Payer: Medicare Other

## 2019-03-13 DIAGNOSIS — Z800 Family history of malignant neoplasm of digestive organs: Secondary | ICD-10-CM

## 2019-03-13 DIAGNOSIS — Z8719 Personal history of other diseases of the digestive system: Secondary | ICD-10-CM

## 2019-03-13 DIAGNOSIS — R935 Abnormal findings on diagnostic imaging of other abdominal regions, including retroperitoneum: Secondary | ICD-10-CM

## 2019-03-13 DIAGNOSIS — Z860100 Personal history of colon polyps, unspecified: Secondary | ICD-10-CM

## 2019-03-13 DIAGNOSIS — R109 Unspecified abdominal pain: Secondary | ICD-10-CM

## 2019-03-13 DIAGNOSIS — D509 Iron deficiency anemia, unspecified: Secondary | ICD-10-CM

## 2019-03-13 DIAGNOSIS — K264 Chronic or unspecified duodenal ulcer with hemorrhage: Secondary | ICD-10-CM

## 2019-03-13 DIAGNOSIS — K921 Melena: Secondary | ICD-10-CM

## 2019-03-13 LAB — HEMOGLOBIN: Hemoglobin: 12.5 g/dL — ABNORMAL LOW (ref 13.0–17.0)

## 2019-03-13 LAB — COMPREHENSIVE METABOLIC PANEL
ALT: 13 U/L (ref 0–53)
AST: 15 U/L (ref 0–37)
Albumin: 3.5 g/dL (ref 3.5–5.2)
Alkaline Phosphatase: 82 U/L (ref 39–117)
BUN: 23 mg/dL (ref 6–23)
CO2: 34 mEq/L — ABNORMAL HIGH (ref 19–32)
Calcium: 8.8 mg/dL (ref 8.4–10.5)
Chloride: 103 mEq/L (ref 96–112)
Creatinine, Ser: 1.25 mg/dL (ref 0.40–1.50)
GFR: 55.06 mL/min — ABNORMAL LOW (ref >60.00)
Glucose, Bld: 257 mg/dL — ABNORMAL HIGH (ref 70–99)
Potassium: 3.5 mEq/L (ref 3.5–5.1)
Sodium: 144 mEq/L (ref 135–145)
Total Bilirubin: 0.5 mg/dL (ref 0.2–1.2)
Total Protein: 5.8 g/dL — ABNORMAL LOW (ref 6.0–8.3)

## 2019-03-13 NOTE — Telephone Encounter (Signed)
Colon and Endo scheduled, pt instructed and medications reviewed.  Patient instructions sent to the pt via My Chart.  Patient to call with any questions or concerns.  Covid information also given and understanding verbalized.

## 2019-03-13 NOTE — Telephone Encounter (Signed)
Patient's wife is calling in asking to get patient rescheduled for endo/colon procedures. She also wanted to let Dr. Rush Landmark know she is bringing him in today to get the labs done.

## 2019-03-14 ENCOUNTER — Encounter: Payer: Self-pay | Admitting: Physical Medicine and Rehabilitation

## 2019-03-14 NOTE — Procedures (Signed)
Lumbar Diagnostic Facet Joint Nerve Block with Fluoroscopic Guidance   Patient: [name]      Date of Birth: 11-Jul-1933 MRN: [id number] PCP: [name], MD      Visit Date: 02/07/2019   Universal Protocol:    Date/Time: 11/12/205:55 AM  Consent Given By: the patient  Position: PRONE  Additional Comments: Vital signs were monitored before and after the procedure. Patient was prepped and draped in the usual sterile fashion. The correct patient, procedure, and site was verified.   Injection Procedure Details:  Procedure Site One Meds Administered:  Meds ordered this encounter  Medications  . methylPREDNISolone acetate (DEPO-MEDROL) injection 80 mg     Laterality: Bilateral  Location/Site:  L3-L4 L4-L5 L5-S1  Needle size: 22 ga.  Needle type:spinal  Needle Placement: Oblique pedical  Findings:   -Comments: There was excellent flow of contrast along the articular pillars without intravascular flow.  Procedure Details: The fluoroscope beam is vertically oriented in AP and then obliqued 15 to 20 degrees to the ipsilateral side of the desired nerve to achieve the "Scotty dog" appearance.  The skin over the target area of the junction of the superior articulating process and the transverse process (sacral ala if blocking the L5 dorsal rami) was locally anesthetized with a 1 ml volume of 1% Lidocaine without Epinephrine.  The spinal needle was inserted and advanced in a trajectory view down to the target.   After contact with periosteum and negative aspirate for blood and CSF, correct placement without intravascular or epidural spread was confirmed by injecting 0.5 ml. of Isovue-250.  A spot radiograph was obtained of this image.    Next, a 0.5 ml. volume of the injectate described above was injected. The needle was then redirected to the other facet joint nerves mentioned above if needed.  Prior to the procedure, the patient was given a Pain Diary which was  completed for baseline measurements.  After the procedure, the patient rated their pain every 30 minutes and will continue rating at this frequency for a total of 5 hours.  The patient has been asked to complete the Diary and return to [place] by mail, fax or hand delivered as soon as possible.   Additional Comments:  The patient tolerated the procedure well Dressing: 2 x 2 sterile gauze and Band-Aid    Post-procedure details: Patient was observed during the procedure. Post-procedure instructions were reviewed.  Patient left the clinic in stable condition.

## 2019-03-14 NOTE — Progress Notes (Signed)
[name] - 85 y.o. male MRN [id number]  Date of birth: 1933-09-21  Office Visit Note: Visit Date: 02/07/2019 PCP: [name], MD Referred by: [name], MD  Subjective: Chief Complaint  Patient presents with  . Lower Back - Pain  . Right Leg - Pain  . Left Leg - Pain   HPI: [name] is a 85 y.o. male who comes in today At the request of Basil Dess for diagnostic medial branch blocks of the lower spine.  Patient is well-known to our office through Dr. Otho Ket spine surgeon.  He has had epidural injections in the past without any relief at all.  He has had medication management without any relief at all.  He has had multiple bouts of physical therapy without any relief at all.  I feel like he probably has underlying potential for fibromyalgia although he has a pretty significant degenerative spine overall.  Degenerative facet joint arthropathy disc height loss without a great deal of central stenosis.  He does have a history of polyneuropathy in the legs and feet.  Most of his pain is with walking and standing worse with facet loading on exam today.  He has recently seen Dr. Estanislado Pandy for evaluation and was started on the Loxitane at the lowest dose and did not tolerate that at all.  He does not tolerate a lot of medications.  Interestingly he does tolerate oxycodone fairly well.  Dr. Louanne Skye is providing oxycodone 5 mg and the patient is taking 30 tablets about every week.  Prior to that he had been maintained on some tramadol.  Today as planned we are going to complete bilateral medial branch blocks of the L3-4 and L4-5 and L5-S1 facet joints.  If you obtains more than 50% relief on double diagnostic blocks we would look at radiofrequency ablation for more definitive treatment.  If he does not get relief with the diagnostic blocks at this point I am really out of anything I would offer at this point from an interventional spine standpoint.  Unfortunately the patient  also comes in today after having had a fall.  He fell 2 days ago on his right side.  It was actually a fall down 4 flights of stairs but the more we spoke with him is really the second to last stair where he slipped and fell down onto his side.  He reports that he was able to walk afterwards and does not feel like he really injured anything.  He was able to bear weight.  His exam is nonfocal today no groin pain he does have a little bit of soreness over the right buttock with some bruising.  No focal weakness.  Review of Systems  Constitutional: Positive for malaise/fatigue. Negative for chills, fever and weight loss.  HENT: Negative for hearing loss and sinus pain.   Eyes: Negative for blurred vision, double vision and photophobia.  Respiratory: Negative for cough and shortness of breath.   Cardiovascular: Negative for chest pain, palpitations and leg swelling.  Gastrointestinal: Negative for abdominal pain, nausea and vomiting.  Genitourinary: Negative for flank pain.  Musculoskeletal: Positive for back pain and joint pain. Negative for myalgias.  Skin: Negative for itching and rash.  Neurological: Positive for tingling and weakness. Negative for tremors and focal weakness.  Endo/Heme/Allergies: Negative.   Psychiatric/Behavioral: Negative for depression.  All other systems reviewed and are negative.  Otherwise per HPI.  Assessment & Plan: Visit Diagnoses:  1. Spondylosis without myelopathy or radiculopathy, lumbar  region   2. Fall at home, initial encounter   3. Chronic bilateral low back pain without sciatica   4. Diabetic polyneuropathy associated with other specified diabetes mellitus ([place])     Plan: Findings:  In terms of his chronic back pain this seems to be a very difficult situation where he has chronic worsening pain without any significant nerve compression but significant degenerative changes of the spine.  He has been maintained on oxycodone with 30 tablets about every 7 or 8  days.  He is asking for refill medication today which we will correct that to Dr. Louanne Skye.  He is really not fared well with therapy as well as medications in general other than the oxycodone.  He did not tolerate duloxetine through Dr. Estanislado Pandy.  He has not had any relief with any injection today in the spine.  We will complete diagnostic medial branch blocks today with pain diary and see how he does.  In terms of his fall I think he will recover from this over the next few weeks.  There was no swelling or anything causing any red flag concerns.  X-ray survey was done with fluoroscopy during the procedure and there was no sign of anything fractured.  No hip fracture etc.  Patient is bearing weight.  She will follow-up with Dr. Louanne Skye at his regular scheduled appointment.    Meds & Orders:  Meds ordered this encounter  Medications  . methylPREDNISolone acetate (DEPO-MEDROL) injection 80 mg    Orders Placed This Encounter  Procedures  . Facet Injection  . XR C-ARM NO REPORT    Follow-up: Return for Review Pain Diary.   Procedures: No procedures performed  Lumbar Diagnostic Facet Joint Nerve Block with Fluoroscopic Guidance   Patient: [name]      Date of Birth: December 29, 1933 MRN: [id number] PCP: [name], MD      Visit Date: 02/07/2019   Universal Protocol:    Date/Time: 11/12/205:55 AM  Consent Given By: the patient  Position: PRONE  Additional Comments: Vital signs were monitored before and after the procedure. Patient was prepped and draped in the usual sterile fashion. The correct patient, procedure, and site was verified.   Injection Procedure Details:  Procedure Site One Meds Administered:  Meds ordered this encounter  Medications  . methylPREDNISolone acetate (DEPO-MEDROL) injection 80 mg     Laterality: Bilateral  Location/Site:  L3-L4 L4-L5 L5-S1  Needle size: 22 ga.  Needle type:spinal  Needle Placement: Oblique pedical  Findings:    -Comments: There was excellent flow of contrast along the articular pillars without intravascular flow.  Procedure Details: The fluoroscope beam is vertically oriented in AP and then obliqued 15 to 20 degrees to the ipsilateral side of the desired nerve to achieve the "Scotty dog" appearance.  The skin over the target area of the junction of the superior articulating process and the transverse process (sacral ala if blocking the L5 dorsal rami) was locally anesthetized with a 1 ml volume of 1% Lidocaine without Epinephrine.  The spinal needle was inserted and advanced in a trajectory view down to the target.   After contact with periosteum and negative aspirate for blood and CSF, correct placement without intravascular or epidural spread was confirmed by injecting 0.5 ml. of Isovue-250.  A spot radiograph was obtained of this image.    Next, a 0.5 ml. volume of the injectate described above was injected. The needle was then redirected to the other facet joint nerves mentioned above if  needed.  Prior to the procedure, the patient was given a Pain Diary which was completed for baseline measurements.  After the procedure, the patient rated their pain every 30 minutes and will continue rating at this frequency for a total of 5 hours.  The patient has been asked to complete the Diary and return to [place] by mail, fax or hand delivered as soon as possible.   Additional Comments:  The patient tolerated the procedure well Dressing: 2 x 2 sterile gauze and Band-Aid    Post-procedure details: Patient was observed during the procedure. Post-procedure instructions were reviewed.  Patient left the clinic in stable condition.   Clinical History: MRI LUMBAR SPINE WITHOUT CONTRAST  TECHNIQUE: Multiplanar, multisequence MR imaging of the lumbar spine was performed. No intravenous contrast was administered.  COMPARISON:  09/13/2016  FINDINGS: Segmentation:  5 lumbar type vertebrae  Alignment:   Dextroscoliosis.  Straightening of the lumbar spine.  Vertebrae:  No fracture, evidence of discitis, or bone lesion.  Conus medullaris and cauda equina: Conus extends to the L1-2 level. Conus and cauda equina appear normal.  Paraspinal and other soft tissues: Aortic and iliac aneurysmal enlargement. The abdominal aorta measures up to 3.3 cm where visualized and left common iliac artery measures up to 2.9 cm  Disc levels:  T12- L1: Spondylosis.  No impingement  L1-L2: Spondylosis.  No impingement  L2-L3: Spondylosis with disc narrowing and mild bulging. No impingement  L3-L4: Severe degenerative disc disease with left-sided collapse and asymmetric spurring. There is moderate left foraminal stenosis. Left subarticular recess narrowing without static L4 compression  L4-L5: Asymmetric leftward degenerative disc collapse with bulge and endplate spurring. Bilateral subarticular recess stenosis with apparent flattening of the left L5 nerve root. There is moderate left more than right foraminal narrowing  L5-S1:Spondylosis and disc degeneration with moderate borderline advanced right foraminal stenosis. The canal is patent  IMPRESSION: 1. Advanced degenerative disease with scoliosis. 2. L3-4 moderate left foraminal narrowing. 3. L4-5 left subarticular recess stenosis and L5 impingement. Moderate bilateral foraminal narrowing. 4. L5-S1 moderate right foraminal narrowing. 5. Aortic and iliac aneurysms without apparent change from 2019 abdominal CT.   Electronically Signed   By: Monte Fantasia M.D.   On: 08/09/2018 09:26   He reports that he quit smoking about 55 years ago. He quit smokeless tobacco use about 31 years ago.  His smokeless tobacco use included chew.  Recent Labs    05/24/18 1617 08/15/18 1117 02/20/19 1346  HGBA1C 6.3* 5.7* 6.3*    Objective:  VS:  HT:    WT:   BMI:     BP:(!) 174/68  HR:76bpm  TEMP: ( )  RESP:  Physical Exam Vitals  signs and nursing note reviewed.  Constitutional:      General: He is not in acute distress.    Appearance: He is well-developed.  HENT:     Head: Normocephalic and atraumatic.     Nose: Nose normal.     Mouth/Throat:     Mouth: Mucous membranes are moist.     Pharynx: Oropharynx is clear.  Eyes:     Conjunctiva/sclera: Conjunctivae normal.     Pupils: Pupils are equal, round, and reactive to light.  Neck:     Musculoskeletal: Normal range of motion and neck supple.     Trachea: No tracheal deviation.  Cardiovascular:     Rate and Rhythm: Normal rate and regular rhythm.     Pulses: Normal pulses.  Pulmonary:     Effort: Pulmonary  effort is normal.     Breath sounds: Normal breath sounds.  Abdominal:     General: There is no distension.     Palpations: Abdomen is soft.     Tenderness: There is no guarding or rebound.  Musculoskeletal:        General: No deformity.     Right lower leg: No edema.     Left lower leg: No edema.     Comments: Patient slow to stand from a seated position does have concordant low back pain with extension and facet loading of the lumbar spine.  Tender throughout the lumbar spine particularly tender over the right buttock area.  No pain with hip rotation good distal strength without clonus.  Impaired sensation in a nondermatomal fashion in both feet.  Skin:    General: Skin is warm and dry.     Findings: No erythema or rash.  Neurological:     General: No focal deficit present.     Mental Status: He is alert and oriented to person, place, and time.     Motor: No abnormal muscle tone.     Coordination: Coordination normal.     Gait: Gait normal.  Psychiatric:        Mood and Affect: Mood normal.        Behavior: Behavior normal.        Thought Content: Thought content normal.     Ortho Exam Imaging: No results found.  Past Medical/Family/Surgical/Social History: Medications & Allergies reviewed per EMR, new medications updated. Patient Active  Problem List   Diagnosis Date Noted  . Chronic bilateral low back pain without sciatica 02/07/2019  . Statin intolerance 11/16/2018  . 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 Hyperlipidemia, mixed 08/15/2018  . Low back pain 07/18/2018  . Acute blood loss anemia 06/24/2018  . Upper GI bleed 06/24/2018  . History of lacunar cerebrovascular accident (CVA) 02/12/2018  . Dizziness 02/12/2018  . Myofascial pain 12/18/2017  . Spondylosis without myelopathy or radiculopathy, lumbar region 12/18/2017  . Lumbar radiculopathy, right 12/18/2017  . Cholelithiasis 05/17/2017  . Sinus Bradycardia 05/17/2017  . Thoracic radiculopathy 04/12/2017  . Primary osteoarthritis of right knee 12/14/2016  . DDD (degenerative disc disease), lumbar 10/10/2016  . Lumbar facet arthropathy 08/29/2016  . Idiopathic scoliosis 03/22/2016  . Diabetic neuropathy ([place]) 03/22/2016  . Dysautonomia orthostatic hypotension syndrome ([place]) 02/25/2016  . CKD stage 3 due to type 2 diabetes mellitus ([place]) 02/19/2016  . Coronary artery disease involving coronary bypass graft of native heart without angina pectoris   . Diabetes mellitus type 2, diet-controlled ([place])   . 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 Medication management 08/11/2015  . 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 PVD (peripheral vascular disease) ([place]) 01/01/2014  . Hyperlipidemia associated with type 2 diabetes mellitus ([place]) 04/11/2013  . Supine hypertension   . Vitamin D deficiency   . Vitamin B 12 deficiency   . Coronary atherosclerosis- s/p PCI to LAD in 2009 and PCI to RCA in 2011 02/27/2009  . Abdominal aortic aneurysm ([place]) 02/27/2009  . GERD 02/27/2009   Past Medical History:  Diagnosis Date  . 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 Colon polyps   . Coronary atherosclerosis of unspecified type of vessel, native or graft   . Diabetes ([place])   . Difficult intubation   . Esophageal reflux   . High cholesterol   . History of IBS 02/27/2009  . Hypertension    pt  denies, he says he has  a h/o hypotension. If BP up he adjusts the Florinef  . Orthostatic hypotension    "BP has been dropping alot when I stand up for the last month or so" (02/17/2016)  . Vitamin B 12 deficiency   . Vitamin D deficiency    Family History  Problem Relation Age of Onset  . Heart attack Father        died age 59  . Heart attack Brother        died age 73  . Anuerysm Brother        aortic  . Heart attack Sister        died age 56  . Colon cancer Sister   . Liver cancer Sister   . Diabetes Maternal Grandmother   . Arthritis Mother   . Dementia Mother   . Heart Problems Brother   . Heart Problems Brother   . Heart Problems Brother   . Heart Problems Brother   . Healthy Daughter   . Healthy Son   . Esophageal cancer Neg Hx   . Inflammatory bowel disease Neg Hx   . Pancreatic cancer Neg Hx   . Stomach cancer Neg Hx    Past Surgical History:  Procedure Laterality Date  . BIOPSY  06/25/2018   Procedure: BIOPSY;  Surgeon: Rush Landmark Telford Nab., MD;  Location: [facility];  Service: Gastroenterology;;  . BLADDER SURGERY  1969   traumatic pelvic fractures, urethral and bladder repair  . CARDIAC CATHETERIZATION N/A 07/01/2015   Procedure: Left Heart Cath and Coronary Angiography;  Surgeon: Wellington Hampshire, MD;  Location: [facility];  Service: Cardiovascular;  Laterality: N/A;  . COLON RESECTION N/A 05/17/2017   Procedure: DIAGNOSTIC LAPAROSCOPY,;  Surgeon: Leighton Ruff, MD;  Location: [facility];  Service: General;  Laterality: N/A;  . CORONARY ANGIOPLASTY WITH STENT PLACEMENT    . CORONARY ARTERY BYPASS GRAFT N/A 07/06/2015   Procedure: CORONARY ARTERY BYPASS GRAFTING (CABG)x 4   utilizing the left internal mammary artery and endoscopically harvested bilateral  sapheneous vein.;  Surgeon: Ivin Poot, MD;  Location: [facility];  Service: Open Heart Surgery;  Laterality: N/A;  . ESOPHAGOGASTRODUODENOSCOPY (EGD) WITH PROPOFOL N/A 06/25/2018   Procedure:  ESOPHAGOGASTRODUODENOSCOPY (EGD) WITH PROPOFOL;  Surgeon: Rush Landmark Telford Nab., MD;  Location: [facility];  Service: Gastroenterology;  Laterality: N/A;  . KNEE SURGERY    . LOOP RECORDER INSERTION N/A 02/19/2018   Procedure: LOOP RECORDER INSERTION;  Surgeon: Deboraha Sprang, MD;  Location: [facility];  Service: Cardiovascular;  Laterality: N/A;  . TEE WITHOUT CARDIOVERSION N/A 07/06/2015   Procedure: TRANSESOPHAGEAL ECHOCARDIOGRAM (TEE);  Surgeon: Ivin Poot, MD;  Location: [facility];  Service: Open Heart Surgery;  Laterality: N/A;   Social History   Occupational History  . Occupation: Retired  Tobacco Use  . Smoking status: Former Smoker    Quit date: 07/16/1963    Years since quitting: 55.6  . Smokeless tobacco: Former Systems developer    Types: Chew    Quit date: 1989  Substance and Sexual Activity  . Alcohol use: No  . Drug use: No  . Sexual activity: Not Currently

## 2019-03-15 ENCOUNTER — Other Ambulatory Visit: Payer: Self-pay

## 2019-03-15 DIAGNOSIS — D509 Iron deficiency anemia, unspecified: Secondary | ICD-10-CM

## 2019-03-15 NOTE — Progress Notes (Signed)
Carelink Summary Report / Loop Recorder 

## 2019-03-19 ENCOUNTER — Other Ambulatory Visit: Payer: Self-pay

## 2019-03-19 ENCOUNTER — Ambulatory Visit: Payer: Medicare Other | Admitting: Internal Medicine

## 2019-03-19 VITALS — BP 162/100 | HR 76 | Temp 97.7°F | Resp 16 | Ht 72.0 in | Wt 183.0 lb

## 2019-03-19 DIAGNOSIS — G903 Multi-system degeneration of the autonomic nervous system: Secondary | ICD-10-CM

## 2019-03-19 DIAGNOSIS — I951 Orthostatic hypotension: Secondary | ICD-10-CM

## 2019-03-19 DIAGNOSIS — I10 Essential (primary) hypertension: Secondary | ICD-10-CM | POA: Diagnosis not present

## 2019-03-19 NOTE — Progress Notes (Signed)
   Subjective:    Patient ID: [name], male    DOB: 1933/08/09, 85 y.o.   MRN: [id number]  HPI   This 85 y.o.MWMfollowed for labile Supine HTN, Dysautonomia w/Postural Hypotension, HLD, ASCAD /CABG,ASCVD /Hx CVA's, T2_DM, Vitamin D Deficiency who presents for f/u of BP's on Florinef.  Patient denies an CP, palpitations, Dyspnea or edema.  Review of Systems  10 point systems review negative except as above.     Objective:   Physical Exam  BP (!) 162/100 Comment: 162/100-sitting and 154/88-standing  Pulse 76   Temp 97.7 F (36.5 C)   Resp 16   Postural    sit BP 105/68   P 74   &       stand BP 137/104    P 92  HEENT - WNL. Neck - supple.  Chest - Clear equal BS. Cor - Nl HS. RRR w/o sig MGR. PP 1(+). No edema. MS- FROM w/o deformities.  Gait Nl. Neuro -  Nl w/o focal abnormalities.    Assessment & Plan:   1. Supine hypertension  2. Dysautonomia orthostatic hypotension syndrome (HCC)  - continue postural BP monitoring & meds same

## 2019-03-20 ENCOUNTER — Encounter: Payer: Self-pay | Admitting: Physical Medicine and Rehabilitation

## 2019-03-20 ENCOUNTER — Ambulatory Visit: Payer: Self-pay

## 2019-03-20 ENCOUNTER — Ambulatory Visit: Payer: Medicare Other | Admitting: Physical Medicine and Rehabilitation

## 2019-03-20 VITALS — BP 175/112 | HR 74

## 2019-03-20 DIAGNOSIS — M47816 Spondylosis without myelopathy or radiculopathy, lumbar region: Secondary | ICD-10-CM

## 2019-03-20 MED ORDER — METHYLPREDNISOLONE ACETATE 80 MG/ML IJ SUSP
40.0000 mg | Freq: Once | INTRAMUSCULAR | Status: AC
  Administered 2019-03-20: 40 mg

## 2019-03-20 NOTE — Progress Notes (Signed)
 .  Numeric Pain Rating Scale and Functional Assessment Average Pain 5   In the last MONTH (on 0-10 scale) has pain interfered with the following?  1. General activity like being  able to carry out your everyday physical activities such as walking, climbing stairs, carrying groceries, or moving a chair?  Rating(6)   +Driver, -BT, -Dye Allergies.  

## 2019-03-21 ENCOUNTER — Encounter: Payer: Self-pay | Admitting: *Deleted

## 2019-03-23 ENCOUNTER — Encounter: Payer: Self-pay | Admitting: Internal Medicine

## 2019-03-27 ENCOUNTER — Ambulatory Visit: Payer: Medicare Other | Admitting: Vascular Surgery

## 2019-03-27 ENCOUNTER — Encounter: Payer: Self-pay | Admitting: Vascular Surgery

## 2019-03-27 ENCOUNTER — Other Ambulatory Visit: Payer: Self-pay

## 2019-03-27 VITALS — BP 186/97 | HR 69 | Temp 97.2°F | Resp 20 | Ht 72.0 in | Wt 183.0 lb

## 2019-03-27 DIAGNOSIS — I714 Abdominal aortic aneurysm, without rupture, unspecified: Secondary | ICD-10-CM

## 2019-03-27 DIAGNOSIS — I723 Aneurysm of iliac artery: Secondary | ICD-10-CM

## 2019-03-27 NOTE — Progress Notes (Signed)
REASON FOR CONSULT:    Abdominal aortic aneurysm.  Consult is requested by Dr. Stanford Breed.  ASSESSMENT & PLAN:   ABDOMINAL AORTIC ANEURYSM AND LEFT COMMON ILIAC ARTERY ANEURYSM: This patient has a 3.8 cm infrarenal abdominal aortic aneurysm.  I have explained that in a normal risk patient we would consider elective repair of an abdominal aortic aneurysm at 5.5 cm.  The patient's right common iliac artery is slightly ectatic at 2.3 cm.  The left common iliac artery is aneurysmal at 2.9 cm.  I have explained that in a normal risk patient we would consider repairing an iliac artery aneurysm at 3-1/2 to 4 cm.  Hopefully his aneurysms will remain stable in size and certainly he would be at increased risk for repair given his age.  If his aneurysms did enlarge he would potentially be a candidate for endovascular repair although on the left this would require coil embolization of the hypogastric artery and extension down into the external iliac artery versus an iliac branch device.  However I will hold off on CT angiography in order to further evaluate this unless the aneurysm is increased significantly in size.  PROMINENT POPLITEAL PULSES: The patient does have prominent popliteal pulses and I have ordered a duplex of his popliteal arteries when he returns in 6 months.  HYPERTENSION: The patient's initial blood pressure today was elevated. We repeated this and this was still elevated. We have encouraged the patient to follow up with their primary care physician for management of their blood pressure.  He tells [place] that his blood pressure typically is all over the place.  I have stressed with him the importance of blood pressure control given his history of an aneurysm.   [name], MD Office: [phone]   HPI:   [name] is a pleasant 85 y.o. male, who has been followed with an abdominal aortic aneurysm by Dr. Stanford Breed.  This had enlarged somewhat in size and he was sent for vascular  consultation.  The patient denies any significant abdominal pain except for some left upper quadrant and left lower chest pain which sounds musculoskeletal in origin.  He also has some chronic low back pain.  His brother died with an aneurysm after this was repaired.  The patient denies any history of claudication or rest pain.  He is activity is fairly limited.  He complains of chronic problems with dizziness.  His risk factors for peripheral vascular disease include diabetes, hypertension, hypercholesterolemia, and a remote history of tobacco use.   Patient has undergone previous lower abdominal surgery for a transected urethra secondary to trauma in the remote past.  Past Medical History:  Diagnosis Date  . 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 Colon polyps   . Coronary atherosclerosis of unspecified type of vessel, native or graft   . Diabetes ([place])   . Difficult intubation   . Esophageal reflux   . High cholesterol   . History of IBS 02/27/2009  . Hypertension    pt denies, he says he has a h/o hypotension. If BP up he adjusts the Florinef  . Orthostatic hypotension    "BP has been dropping alot when I stand up for the last month or so" (02/17/2016)  . Vitamin B 12 deficiency   . Vitamin D deficiency     Family History  Problem Relation Age of Onset  . Heart attack Father        died age 30  . Heart attack Brother  died age 65  . Anuerysm Brother        aortic  . Heart attack Sister        died age 84  . Colon cancer Sister   . Liver cancer Sister   . Diabetes Maternal Grandmother   . Arthritis Mother   . Dementia Mother   . Heart Problems Brother   . Heart Problems Brother   . Heart Problems Brother   . Heart Problems Brother   . Healthy Daughter   . Healthy Son   . Esophageal cancer Neg Hx   . Inflammatory bowel disease Neg Hx   . Pancreatic cancer Neg Hx   . Stomach cancer Neg Hx     SOCIAL HISTORY: Social History   Socioeconomic History  .  Marital status: Married    Spouse name: Not on file  . Number of children: 2  . Years of education: Not on file  . Highest education level: High school graduate  Occupational History  . Occupation: Retired  Scientific laboratory technician  . Financial resource strain: Not on file  . Food insecurity    Worry: Not on file    Inability: Not on file  . Transportation needs    Medical: Not on file    Non-medical: Not on file  Tobacco Use  . Smoking status: Former Smoker    Quit date: 07/16/1963    Years since quitting: 55.7  . Smokeless tobacco: Former Systems developer    Types: Chew    Quit date: 1989  Substance and Sexual Activity  . Alcohol use: No  . Drug use: No  . Sexual activity: Not Currently  Lifestyle  . Physical activity    Days per week: Not on file    Minutes per session: Not on file  . Stress: Not on file  Relationships  . Social Herbalist on phone: Not on file    Gets together: Not on file    Attends religious service: Not on file    Active member of club or organization: Not on file    Attends meetings of clubs or organizations: Not on file    Relationship status: Not on file  . Intimate partner violence    Fear of current or ex partner: Not on file    Emotionally abused: Not on file    Physically abused: Not on file    Forced sexual activity: Not on file  Other Topics Concern  . Not on file  Social History Narrative   Lives at home with his wife Estill Bamberg   Right handed   No caffeine    Allergies  Allergen Reactions  . Cymbalta [Duloxetine Hcl] Other (See Comments)    Dizziness, hallucinations.   . Keflex [Cephalexin] Other (See Comments)    dizziness  . Simvastatin Other (See Comments)    Joint pain  . Sudafed [Pseudoephedrine] Other (See Comments)    Dizziness    Current Outpatient Medications  Medication Sig Dispense Refill  . acetaminophen (TYLENOL) 500 MG tablet Take 1,000 mg by mouth 2 (two) times daily as needed for headache (pain).     . Ascorbic Acid  (VITAMIN C) 500 MG CAPS Take 1 capsule by mouth daily.    Marland Kitchen aspirin EC 81 MG tablet Take 1 tablet (81 mg total) by mouth daily.    . butalbital-acetaminophen-caffeine (FIORICET) 50-325-40 MG tablet TAKE 1 TABLET EVERY 4 HOURS ONLY IF SEVERE HEADACHE 30 tablet 0  . Calcium Carb-Cholecalciferol (CALCIUM+D3 PO) Take  1 tablet by mouth daily.    . Cholecalciferol (VITAMIN D-3) 5000 units TABS Take 5,000 Units by mouth daily.    . DULoxetine (CYMBALTA) 60 MG capsule Take 1 capsule Daily for Chronic Pain 90 capsule 3  . Ferrous Sulfate (IRON SLOW RELEASE) 140 (45 Fe) MG TBCR Take 1 tablet by mouth daily.    . fludrocortisone (FLORINEF) 0.1 MG tablet TAKE 2 TABLETS BY MOUTH EVERY DAY 180 tablet 1  . gabapentin (NEURONTIN) 600 MG tablet Take 1/2    to   1   tablet 2 x /day for Chronic Pain 60 tablet 3  . glucose blood test strip Check blood sugar 1 time daily-DX-E11.9 100 each 5  . Magnesium 500 MG TABS Take 1 tablet by mouth daily.     . Multiple Vitamin (MULTIVITAMIN WITH MINERALS) TABS tablet Take 1 tablet by mouth daily.    Marland Kitchen olmesartan (BENICAR) 40 MG tablet Take 20-40 mg by mouth as needed.    . Omega-3 Fatty Acids (FISH OIL OMEGA-3 PO) Take 1 capsule by mouth daily.    . pantoprazole (PROTONIX) 40 MG tablet Take 1 tablet Daily for Indigestion & Heartburn 90 tablet 3  . polyethylene glycol (MIRALAX / GLYCOLAX) packet Take 17 g by mouth daily. (Patient taking differently: Take 17 g by mouth 2 (two) times daily. ) 28 each 1  . POTASSIUM CHLORIDE PO Take 595 mg by mouth daily.     . predniSONE (DELTASONE) 5 MG tablet Take 2 tablets in the Morning & 1 tablet in the Afternoon 90 tablet 3  . vitamin B-12 (CYANOCOBALAMIN) 1000 MCG tablet Take 1,000 mcg by mouth daily.    . Zinc 50 MG TABS Take 1 tablet by mouth daily.     No current facility-administered medications for this visit.     REVIEW OF SYSTEMS:  [X]  denotes positive finding, [ ]  denotes negative finding Cardiac  Comments:  Chest pain or  chest pressure:    Shortness of breath upon exertion:    Short of breath when lying flat:    Irregular heart rhythm:        Vascular    Pain in calf, thigh, or hip brought on by ambulation:    Pain in feet at night that wakes you up from your sleep:     Blood clot in your veins:    Leg swelling:         Pulmonary    Oxygen at home:    Productive cough:     Wheezing:         Neurologic    Sudden weakness in arms or legs:     Sudden numbness in arms or legs:     Sudden onset of difficulty speaking or slurred speech:    Temporary loss of vision in one eye:     Problems with dizziness:  x       Gastrointestinal    Blood in stool:     Vomited blood:         Genitourinary    Burning when urinating:     Blood in urine:        Psychiatric    Major depression:         Hematologic    Bleeding problems:    Problems with blood clotting too easily:        Skin    Rashes or ulcers:        Constitutional    Fever or chills:  PHYSICAL EXAM:   Vitals:   03/27/19 1017  BP: (!) 186/97  Pulse: 69  Resp: 20  Temp: (!) 97.2 F (36.2 C)  SpO2: 97%  Weight: 183 lb (83 kg)  Height: 6' (1.829 m)    GENERAL: The patient is a well-nourished male, in no acute distress. The vital signs are documented above. CARDIAC: There is a regular rate and rhythm.  VASCULAR: I do not detect carotid bruits. He has palpable femoral pulses and prominent popliteal pulses.  He has monophasic Doppler signals in both feet.  The left posterior tibial signal is difficult to obtain. PULMONARY: There is good air exchange bilaterally without wheezing or rales. ABDOMEN: Soft and non-tender with normal pitched bowel sounds.  MUSCULOSKELETAL: There are no major deformities or cyanosis. NEUROLOGIC: No focal weakness or paresthesias are detected. SKIN: There are no ulcers or rashes noted. PSYCHIATRIC: The patient has a normal affect.  DATA:    DUPLEX ABDOMINAL AORTA: I reviewed his most recent duplex  of the abdominal aorta that was done on 02/11/2019.  The maximum diameter of his infrarenal aorta is 3.8 cm.  The right common iliac artery measures 2.3 cm in maximum diameter.  The left common iliac artery measures 2.9 cm in maximum diameter.  LABS: His most recent GFR this month was 55.  Creatinine 1.25.

## 2019-03-31 LAB — CUP PACEART REMOTE DEVICE CHECK
Date Time Interrogation Session: 20201127090207
Implantable Pulse Generator Implant Date: 20191021

## 2019-04-01 ENCOUNTER — Other Ambulatory Visit: Payer: Self-pay

## 2019-04-01 ENCOUNTER — Encounter: Payer: Self-pay | Admitting: Specialist

## 2019-04-01 ENCOUNTER — Ambulatory Visit (INDEPENDENT_AMBULATORY_CARE_PROVIDER_SITE_OTHER): Payer: Medicare Other | Admitting: Specialist

## 2019-04-01 ENCOUNTER — Telehealth: Payer: Self-pay | Admitting: *Deleted

## 2019-04-01 ENCOUNTER — Ambulatory Visit (INDEPENDENT_AMBULATORY_CARE_PROVIDER_SITE_OTHER): Payer: Medicare Other | Admitting: *Deleted

## 2019-04-01 VITALS — BP 158/91 | HR 84 | Ht 72.0 in | Wt 183.0 lb

## 2019-04-01 DIAGNOSIS — M4156 Other secondary scoliosis, lumbar region: Secondary | ICD-10-CM

## 2019-04-01 DIAGNOSIS — M48062 Spinal stenosis, lumbar region with neurogenic claudication: Secondary | ICD-10-CM | POA: Diagnosis not present

## 2019-04-01 DIAGNOSIS — R001 Bradycardia, unspecified: Secondary | ICD-10-CM | POA: Diagnosis not present

## 2019-04-01 DIAGNOSIS — M5137 Other intervertebral disc degeneration, lumbosacral region: Secondary | ICD-10-CM | POA: Diagnosis not present

## 2019-04-01 NOTE — Telephone Encounter (Signed)
Spoke with patient's wife to request manual Carelink transmission for review of "tachy" episodes. ECGs suggest SVT (ST vs AT), tachy detection previously decreased to help determine episode onset. Will review when received.

## 2019-04-01 NOTE — Patient Instructions (Signed)
Plan: Avoid bending, stooping and avoid lifting weights greater than 10 lbs. Avoid prolong standing and walking. Avoid frequent bending and stooping  No lifting greater than 10 lbs. May use ice or moist heat for pain. Weight loss is of benefit. Handicap license is approved. Dr. Ernestina Patches plans to perform a radio frequency ablation of the nerves than give sensation to the arthritic joints in the Lumbar spine.  After RFA if pain persists then consideration of surgical fusion vs a spinal cord stimulator.

## 2019-04-01 NOTE — Progress Notes (Signed)
Office Visit Note   Patient: [name]           Date of Birth: May 23, 1933           MRN: [id number] Visit Date: 04/01/2019              Requested by: [name], [address] [place],  [place] [address] PCP: [name], MD   Assessment & Plan: Visit Diagnoses:  1. Spinal stenosis of lumbar region with neurogenic claudication   2. Disc disease, degenerative, lumbar or lumbosacral   3. Other secondary scoliosis, lumbar region     Plan: Avoid bending, stooping and avoid lifting weights greater than 10 lbs. Avoid prolong standing and walking. Avoid frequent bending and stooping  No lifting greater than 10 lbs. May use ice or moist heat for pain. Weight loss is of benefit. Handicap license is approved. Dr. Ernestina Patches plans to perform a radio frequency ablation of the nerves than give sensation to the arthritic joints in the Lumbar spine.  After RFA if pain persists then consideration of surgical fusion vs a spinal cord stimulator. Follow-Up Instructions: No follow-ups on file.   Orders:  No orders of the defined types were placed in this encounter.  No orders of the defined types were placed in this encounter.     Procedures: No procedures performed   Clinical Data: No additional findings.   Subjective: Chief Complaint  Patient presents with  . Lower Back - Follow-up    Had Left side RFA on 03/20/2019, states that he can tell some difference, but he is still having a lot of pain on right side.  Has Right side RFA scheduled for 04/02/2019.    85 year old male with a collapsing degenerative scoliosis and right knee and leg weakness. He is undergoing RFA of the facets and wishes to avoid surgical treatment due to cardiac risk. He wants to see if the RFA helps. No bowel or bladder discomfort. Pain is into the bilateral lower ribs and radiates around the lower torso.   Review of Systems   Objective: Vital Signs: BP (!) 158/91 (BP  Location: Left Arm, Patient Position: Sitting)   Pulse 84   Ht 6' (1.829 m)   Wt 183 lb (83 kg)   BMI 24.82 kg/m   Physical Exam Constitutional:      Appearance: He is well-developed.  HENT:     Head: Normocephalic and atraumatic.  Eyes:     Pupils: Pupils are equal, round, and reactive to light.  Neck:     Musculoskeletal: Normal range of motion and neck supple.  Pulmonary:     Effort: Pulmonary effort is normal.     Breath sounds: Normal breath sounds.  Abdominal:     General: Bowel sounds are normal.     Palpations: Abdomen is soft.  Musculoskeletal: Normal range of motion.  Skin:    General: Skin is warm and dry.  Neurological:     Mental Status: He is alert and oriented to person, place, and time.  Psychiatric:        Behavior: Behavior normal.        Thought Content: Thought content normal.        Judgment: Judgment normal.     Back Exam   Tenderness  The patient is experiencing tenderness in the lumbar.  Muscle Strength  Right Quadriceps:  5/5  Left Quadriceps:  5/5  Right Hamstrings:  5/5  Left Hamstrings:  5/5  Tests  Straight leg raise right: negative Straight leg raise left: negative  Reflexes  Patellar:  0/4 normal Achilles:  0/4 normal Babinski's sign: normal   Other  Toe walk: abnormal Heel walk: abnormal Erythema: no back redness Scars: absent  Comments:  Weak right quadriceps and right foot dorsiflexion.       Specialty Comments:  No specialty comments available.  Imaging: No results found.   PMFS History: Patient Active Problem List   Diagnosis Date Noted  . Chronic bilateral low back pain without sciatica 02/07/2019  . Statin intolerance 11/16/2018  . 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 Hyperlipidemia, mixed 08/15/2018  . Low back pain 07/18/2018  . Acute blood loss anemia 06/24/2018  . Upper GI bleed 06/24/2018  . History of lacunar cerebrovascular accident (CVA) 02/12/2018  . Dizziness 02/12/2018  . Myofascial  pain 12/18/2017  . Spondylosis without myelopathy or radiculopathy, lumbar region 12/18/2017  . Lumbar radiculopathy, right 12/18/2017  . Cholelithiasis 05/17/2017  . Sinus Bradycardia 05/17/2017  . Thoracic radiculopathy 04/12/2017  . Primary osteoarthritis of right knee 12/14/2016  . DDD (degenerative disc disease), lumbar 10/10/2016  . Lumbar facet arthropathy 08/29/2016  . Idiopathic scoliosis 03/22/2016  . Diabetic neuropathy ([place]) 03/22/2016  . Dysautonomia orthostatic hypotension syndrome ([place]) 02/25/2016  . CKD stage 3 due to type 2 diabetes mellitus ([place]) 02/19/2016  . Coronary artery disease involving coronary bypass graft of native heart without angina pectoris   . Diabetes mellitus type 2, diet-controlled ([place])   . 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 Medication management 08/11/2015  . 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 PVD (peripheral vascular disease) ([place]) 01/01/2014  . Hyperlipidemia associated with type 2 diabetes mellitus ([place]) 04/11/2013  . Supine hypertension   . Vitamin D deficiency   . Vitamin B 12 deficiency   . Coronary atherosclerosis- s/p PCI to LAD in 2009 and PCI to RCA in 2011 02/27/2009  . Abdominal aortic aneurysm ([place]) 02/27/2009  . GERD 02/27/2009   Past Medical History:  Diagnosis Date  . 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 Colon polyps   . Coronary atherosclerosis of unspecified type of vessel, native or graft   . Diabetes ([place])   . Difficult intubation   . Esophageal reflux   . High cholesterol   . History of IBS 02/27/2009  . Hypertension    pt denies, he says he has a h/o hypotension. If BP up he adjusts the Florinef  . Orthostatic hypotension    "BP has been dropping alot when I stand up for the last month or so" (02/17/2016)  . Vitamin B 12 deficiency   . Vitamin D deficiency     Family History  Problem Relation Age of Onset  . Heart attack Father        died age 20  . Heart attack Brother        died age 50  . Anuerysm Brother         aortic  . Heart attack Sister        died age 53  . Colon cancer Sister   . Liver cancer Sister   . Diabetes Maternal Grandmother   . Arthritis Mother   . Dementia Mother   . Heart Problems Brother   . Heart Problems Brother   . Heart Problems Brother   . Heart Problems Brother   . Healthy Daughter   . Healthy Son   . Esophageal cancer Neg Hx   . Inflammatory bowel disease Neg  Hx   . Pancreatic cancer Neg Hx   . Stomach cancer Neg Hx     Past Surgical History:  Procedure Laterality Date  . BIOPSY  06/25/2018   Procedure: BIOPSY;  Surgeon: Rush Landmark Telford Nab., MD;  Location: [facility];  Service: Gastroenterology;;  . BLADDER SURGERY  1969   traumatic pelvic fractures, urethral and bladder repair  . CARDIAC CATHETERIZATION N/A 07/01/2015   Procedure: Left Heart Cath and Coronary Angiography;  Surgeon: Wellington Hampshire, MD;  Location: [facility];  Service: Cardiovascular;  Laterality: N/A;  . COLON RESECTION N/A 05/17/2017   Procedure: DIAGNOSTIC LAPAROSCOPY,;  Surgeon: Leighton Ruff, MD;  Location: [facility];  Service: General;  Laterality: N/A;  . CORONARY ANGIOPLASTY WITH STENT PLACEMENT    . CORONARY ARTERY BYPASS GRAFT N/A 07/06/2015   Procedure: CORONARY ARTERY BYPASS GRAFTING (CABG)x 4   utilizing the left internal mammary artery and endoscopically harvested bilateral  sapheneous vein.;  Surgeon: Ivin Poot, MD;  Location: [facility];  Service: Open Heart Surgery;  Laterality: N/A;  . ESOPHAGOGASTRODUODENOSCOPY (EGD) WITH PROPOFOL N/A 06/25/2018   Procedure: ESOPHAGOGASTRODUODENOSCOPY (EGD) WITH PROPOFOL;  Surgeon: Rush Landmark Telford Nab., MD;  Location: [facility];  Service: Gastroenterology;  Laterality: N/A;  . KNEE SURGERY    . LOOP RECORDER INSERTION N/A 02/19/2018   Procedure: LOOP RECORDER INSERTION;  Surgeon: Deboraha Sprang, MD;  Location: [facility];  Service: Cardiovascular;  Laterality: N/A;  . TEE WITHOUT CARDIOVERSION N/A 07/06/2015   Procedure:  TRANSESOPHAGEAL ECHOCARDIOGRAM (TEE);  Surgeon: Ivin Poot, MD;  Location: [facility];  Service: Open Heart Surgery;  Laterality: N/A;   Social History   Occupational History  . Occupation: Retired  Tobacco Use  . Smoking status: Former Smoker    Quit date: 07/16/1963    Years since quitting: 55.7  . Smokeless tobacco: Former Systems developer    Types: Chew    Quit date: 1989  Substance and Sexual Activity  . Alcohol use: No  . Drug use: No  . Sexual activity: Not Currently

## 2019-04-02 ENCOUNTER — Ambulatory Visit: Payer: Medicare Other | Admitting: Physical Medicine and Rehabilitation

## 2019-04-02 ENCOUNTER — Ambulatory Visit: Payer: Self-pay

## 2019-04-02 ENCOUNTER — Encounter: Payer: Self-pay | Admitting: Physical Medicine and Rehabilitation

## 2019-04-02 VITALS — BP 172/96 | HR 77

## 2019-04-02 DIAGNOSIS — M47816 Spondylosis without myelopathy or radiculopathy, lumbar region: Secondary | ICD-10-CM | POA: Diagnosis not present

## 2019-04-02 MED ORDER — METHYLPREDNISOLONE ACETATE 80 MG/ML IJ SUSP
40.0000 mg | Freq: Once | INTRAMUSCULAR | Status: AC
  Administered 2019-04-02: 40 mg

## 2019-04-02 NOTE — Telephone Encounter (Signed)
I let the pt wife know we still did not receive the manual transmission. I gave her my direct office number to get help with the monitor. She agreed to send one later today.

## 2019-04-02 NOTE — Telephone Encounter (Signed)
Transmission received 04-02-2019

## 2019-04-02 NOTE — Progress Notes (Signed)
 .  Numeric Pain Rating Scale and Functional Assessment Average Pain 7   In the last MONTH (on 0-10 scale) has pain interfered with the following?  1. General activity like being  able to carry out your everyday physical activities such as walking, climbing stairs, carrying groceries, or moving a chair?  Rating(6)   +Driver, -BT, -Dye Allergies.  

## 2019-04-02 NOTE — Telephone Encounter (Signed)
Tachy episodes reviewed--gradual onset, suggesting ST. Continue to monitor remotely via Carelink.

## 2019-04-03 NOTE — Procedures (Signed)
Lumbar Facet Joint Nerve Denervation  Patient: [name]      Date of Birth: 02-15-1934 MRN: [id number] PCP: [name], MD      Visit Date: 04/02/2019   Universal Protocol:    Date/Time: 12/02/206:20 AM  Consent Given By: the patient  Position: PRONE  Additional Comments: Vital signs were monitored before and after the procedure. Patient was prepped and draped in the usual sterile fashion. The correct patient, procedure, and site was verified.   Injection Procedure Details:  Procedure Site One Meds Administered:  Meds ordered this encounter  Medications  . methylPREDNISolone acetate (DEPO-MEDROL) injection 40 mg     Laterality: Right  Location/Site:  L3-L4 L4-L5 L5-S1  Needle size: 18 G  Needle type: Radiofrequency cannula  Needle Placement: Along juncture of superior articular process and transverse pocess  Findings:  -Comments:  Procedure Details: For each desired target nerve, the corresponding transverse process (sacral ala for the L5 dorsal rami) was identified and the fluoroscope was positioned to square off the endplates of the corresponding vertebral body to achieve a true AP midline view.  The beam was then obliqued 15 to 20 degrees and caudally tilted 15 to 20 degrees to line up a trajectory along the target nerves. The skin over the target of the junction of superior articulating process and transverse process (sacral ala for the L5 dorsal rami) was infiltrated with 64ml of 1% Lidocaine without Epinephrine.  The 18 gauge 64mm active tip outer cannula was advanced in trajectory view to the target.  This procedure was repeated for each target nerve.  Then, for all levels, the outer cannula placement was fine-tuned and the position was then confirmed with bi-planar imaging.    Test stimulation was done both at sensory and motor levels to ensure there was no radicular stimulation. The target tissues were then infiltrated with 1 ml of 1% Lidocaine  without Epinephrine. Subsequently, a percutaneous neurotomy was carried out for 90 seconds at 80 degrees Celsius.  After the completion of the lesion, 1 ml of injectate was delivered. It was then repeated for each facet joint nerve mentioned above. Appropriate radiographs were obtained to verify the probe placement during the neurotomy.   Additional Comments:  The patient tolerated the procedure well Dressing: 2 x 2 sterile gauze and Band-Aid    Post-procedure details: Patient was observed during the procedure. Post-procedure instructions were reviewed.  Patient left the clinic in stable condition.

## 2019-04-03 NOTE — Progress Notes (Signed)
[name] - 85 y.o. male MRN [id number]  Date of birth: 1933-05-10  Office Visit Note: Visit Date: 04/02/2019 PCP: [name], MD Referred by: [name], MD  Subjective: Chief Complaint  Patient presents with  . Lower Back - Pain   HPI:  [name] is a 85 y.o. male who comes in today For planned right-sided radiofrequency ablation of the L3-4 and L4-5 and L5-S1 facet joints.  Patient had left-sided completed recently and has noticed some mild relief with that at this point although I think he still needs some time there to see the nerves fully scarred down and degenerate.  He is having right more than left-sided pain at this point which I think is a further indication left side may be doing some benefit.  Please see our prior notes for further details and justification this should be documented fairly well at this point.  We will complete the ablation of the right side today.  ROS Otherwise per HPI.  Assessment & Plan: Visit Diagnoses:  1. Spondylosis without myelopathy or radiculopathy, lumbar region     Plan: No additional findings.   Meds & Orders:  Meds ordered this encounter  Medications  . methylPREDNISolone acetate (DEPO-MEDROL) injection 40 mg    Orders Placed This Encounter  Procedures  . Radiofrequency,Lumbar  . XR C-ARM NO REPORT    Follow-up: No follow-ups on file.   Procedures: No procedures performed  Lumbar Facet Joint Nerve Denervation  Patient: [name]      Date of Birth: April 21, 1934 MRN: [id number] PCP: [name], MD      Visit Date: 04/02/2019   Universal Protocol:    Date/Time: 12/02/206:20 AM  Consent Given By: the patient  Position: PRONE  Additional Comments: Vital signs were monitored before and after the procedure. Patient was prepped and draped in the usual sterile fashion. The correct patient, procedure, and site was verified.   Injection Procedure Details:  Procedure Site One Meds  Administered:  Meds ordered this encounter  Medications  . methylPREDNISolone acetate (DEPO-MEDROL) injection 40 mg     Laterality: Right  Location/Site:  L3-L4 L4-L5 L5-S1  Needle size: 18 G  Needle type: Radiofrequency cannula  Needle Placement: Along juncture of superior articular process and transverse pocess  Findings:  -Comments:  Procedure Details: For each desired target nerve, the corresponding transverse process (sacral ala for the L5 dorsal rami) was identified and the fluoroscope was positioned to square off the endplates of the corresponding vertebral body to achieve a true AP midline view.  The beam was then obliqued 15 to 20 degrees and caudally tilted 15 to 20 degrees to line up a trajectory along the target nerves. The skin over the target of the junction of superior articulating process and transverse process (sacral ala for the L5 dorsal rami) was infiltrated with 3ml of 1% Lidocaine without Epinephrine.  The 18 gauge 9mm active tip outer cannula was advanced in trajectory view to the target.  This procedure was repeated for each target nerve.  Then, for all levels, the outer cannula placement was fine-tuned and the position was then confirmed with bi-planar imaging.    Test stimulation was done both at sensory and motor levels to ensure there was no radicular stimulation. The target tissues were then infiltrated with 1 ml of 1% Lidocaine without Epinephrine. Subsequently, a percutaneous neurotomy was carried out for 90 seconds at 80 degrees Celsius.  After the completion of the lesion, 1 ml of injectate  was delivered. It was then repeated for each facet joint nerve mentioned above. Appropriate radiographs were obtained to verify the probe placement during the neurotomy.   Additional Comments:  The patient tolerated the procedure well Dressing: 2 x 2 sterile gauze and Band-Aid    Post-procedure details: Patient was observed during the procedure. Post-procedure  instructions were reviewed.  Patient left the clinic in stable condition.      Clinical History: MRI LUMBAR SPINE WITHOUT CONTRAST  TECHNIQUE: Multiplanar, multisequence MR imaging of the lumbar spine was performed. No intravenous contrast was administered.  COMPARISON:  09/13/2016  FINDINGS: Segmentation:  5 lumbar type vertebrae  Alignment:  Dextroscoliosis.  Straightening of the lumbar spine.  Vertebrae:  No fracture, evidence of discitis, or bone lesion.  Conus medullaris and cauda equina: Conus extends to the L1-2 level. Conus and cauda equina appear normal.  Paraspinal and other soft tissues: Aortic and iliac aneurysmal enlargement. The abdominal aorta measures up to 3.3 cm where visualized and left common iliac artery measures up to 2.9 cm  Disc levels:  T12- L1: Spondylosis.  No impingement  L1-L2: Spondylosis.  No impingement  L2-L3: Spondylosis with disc narrowing and mild bulging. No impingement  L3-L4: Severe degenerative disc disease with left-sided collapse and asymmetric spurring. There is moderate left foraminal stenosis. Left subarticular recess narrowing without static L4 compression  L4-L5: Asymmetric leftward degenerative disc collapse with bulge and endplate spurring. Bilateral subarticular recess stenosis with apparent flattening of the left L5 nerve root. There is moderate left more than right foraminal narrowing  L5-S1:Spondylosis and disc degeneration with moderate borderline advanced right foraminal stenosis. The canal is patent  IMPRESSION: 1. Advanced degenerative disease with scoliosis. 2. L3-4 moderate left foraminal narrowing. 3. L4-5 left subarticular recess stenosis and L5 impingement. Moderate bilateral foraminal narrowing. 4. L5-S1 moderate right foraminal narrowing. 5. Aortic and iliac aneurysms without apparent change from 2019 abdominal CT.   Electronically Signed   By: Monte Fantasia M.D.   On:  08/09/2018 09:26     Objective:  VS:  HT:    WT:   BMI:     BP:(!) 172/96  HR:77bpm  TEMP: ( )  RESP:  Physical Exam  Ortho Exam Imaging: Xr C-arm No Report  Result Date: 04/02/2019 Please see Notes tab for imaging impression.

## 2019-04-03 NOTE — Procedures (Signed)
Lumbar Facet Joint Nerve Denervation  Patient: [name]      Date of Birth: [birth year] MRN: [id number] PCP: [name], MD      Visit Date: 03/20/2019   Universal Protocol:    Date/Time: 12/02/206:25 AM  Consent Given By: the patient  Position: PRONE  Additional Comments: Vital signs were monitored before and after the procedure. Patient was prepped and draped in the usual sterile fashion. The correct patient, procedure, and site was verified.   Injection Procedure Details:  Procedure Site One Meds Administered:  Meds ordered this encounter  Medications  . methylPREDNISolone acetate (DEPO-MEDROL) injection 40 mg     Laterality: Left  Location/Site:  L3-L4 L4-L5 L5-S1  Needle size: 18 G  Needle type: Radiofrequency cannula  Needle Placement: Along juncture of superior articular process and transverse pocess  Findings:  -Comments:  Procedure Details: For each desired target nerve, the corresponding transverse process (sacral ala for the L5 dorsal rami) was identified and the fluoroscope was positioned to square off the endplates of the corresponding vertebral body to achieve a true AP midline view.  The beam was then obliqued 15 to 20 degrees and caudally tilted 15 to 20 degrees to line up a trajectory along the target nerves. The skin over the target of the junction of superior articulating process and transverse process (sacral ala for the L5 dorsal rami) was infiltrated with 58ml of 1% Lidocaine without Epinephrine.  The 18 gauge 39mm active tip outer cannula was advanced in trajectory view to the target.  This procedure was repeated for each target nerve.  Then, for all levels, the outer cannula placement was fine-tuned and the position was then confirmed with bi-planar imaging.    Test stimulation was done both at sensory and motor levels to ensure there was no radicular stimulation. The target tissues were then infiltrated with 1 ml of 1% Lidocaine  without Epinephrine. Subsequently, a percutaneous neurotomy was carried out for 90 seconds at 80 degrees Celsius.  After the completion of the lesion, 1 ml of injectate was delivered. It was then repeated for each facet joint nerve mentioned above. Appropriate radiographs were obtained to verify the probe placement during the neurotomy.   Additional Comments:  The patient tolerated the procedure well Dressing: 2 x 2 sterile gauze and Band-Aid    Post-procedure details: Patient was observed during the procedure. Post-procedure instructions were reviewed.  Patient left the clinic in stable condition.

## 2019-04-03 NOTE — Progress Notes (Signed)
[name] - 85 y.o. male MRN [id number]  Date of birth: 03-Oct-1933  Office Visit Note: Visit Date: 03/20/2019 PCP: [name], MD Referred by: [name], MD  Subjective: Chief Complaint  Patient presents with  . Middle Back - Pain  . Lower Back - Pain   HPI: [name] is a 85 y.o. male who comes in today For planned left L3-4 and L4-5 and L5-S1 facet joint radiofrequency ablation.  Patient has had double block medial branch blocks of the bilateral facet joints listed above.  He has had pain diary relief of more than 50%.  He has been followed closely by Dr. Basil Dess his spine surgeon.  He has degenerative changes of the lumbar spine with facet arthropathy and degenerative disc height loss but without any high-grade central stenosis.  He has multiple overall pain complaints including abdominal pain and back pain and some neuropathy type pain.  No radicular pain.  He is failed conservative care with medication management including some level opioid management as well as physical therapy.  All of this is failed.  There may be some underlying generalized anxiety or fibromyalgia type pain as well.  He does have significant facet arthropathy and did do well with medial branch blocks and those notes are in the chart and can be reviewed.  Exam today is consistent with pain with extension pain with going from sit to stand.  Pain is worse with standing and ambulating.  ROS Otherwise per HPI.  Assessment & Plan: Visit Diagnoses:  1. Spondylosis without myelopathy or radiculopathy, lumbar region     Plan: No additional findings.   Meds & Orders:  Meds ordered this encounter  Medications  . methylPREDNISolone acetate (DEPO-MEDROL) injection 40 mg    Orders Placed This Encounter  Procedures  . Radiofrequency,Lumbar  . XR C-ARM NO REPORT    Follow-up: Return in about 2 weeks (around 04/03/2019) for Recheck spine.   Procedures: No procedures performed  Lumbar  Facet Joint Nerve Denervation  Patient: [name]      Date of Birth: 1933-09-02 MRN: [id number] PCP: [name], MD      Visit Date: 03/20/2019   Universal Protocol:    Date/Time: 12/02/206:25 AM  Consent Given By: the patient  Position: PRONE  Additional Comments: Vital signs were monitored before and after the procedure. Patient was prepped and draped in the usual sterile fashion. The correct patient, procedure, and site was verified.   Injection Procedure Details:  Procedure Site One Meds Administered:  Meds ordered this encounter  Medications  . methylPREDNISolone acetate (DEPO-MEDROL) injection 40 mg     Laterality: Left  Location/Site:  L3-L4 L4-L5 L5-S1  Needle size: 18 G  Needle type: Radiofrequency cannula  Needle Placement: Along juncture of superior articular process and transverse pocess  Findings:  -Comments:  Procedure Details: For each desired target nerve, the corresponding transverse process (sacral ala for the L5 dorsal rami) was identified and the fluoroscope was positioned to square off the endplates of the corresponding vertebral body to achieve a true AP midline view.  The beam was then obliqued 15 to 20 degrees and caudally tilted 15 to 20 degrees to line up a trajectory along the target nerves. The skin over the target of the junction of superior articulating process and transverse process (sacral ala for the L5 dorsal rami) was infiltrated with 87ml of 1% Lidocaine without Epinephrine.  The 18 gauge 70mm active tip outer cannula was advanced in trajectory view  to the target.  This procedure was repeated for each target nerve.  Then, for all levels, the outer cannula placement was fine-tuned and the position was then confirmed with bi-planar imaging.    Test stimulation was done both at sensory and motor levels to ensure there was no radicular stimulation. The target tissues were then infiltrated with 1 ml of 1% Lidocaine without  Epinephrine. Subsequently, a percutaneous neurotomy was carried out for 90 seconds at 80 degrees Celsius.  After the completion of the lesion, 1 ml of injectate was delivered. It was then repeated for each facet joint nerve mentioned above. Appropriate radiographs were obtained to verify the probe placement during the neurotomy.   Additional Comments:  The patient tolerated the procedure well Dressing: 2 x 2 sterile gauze and Band-Aid    Post-procedure details: Patient was observed during the procedure. Post-procedure instructions were reviewed.  Patient left the clinic in stable condition.      Clinical History: MRI LUMBAR SPINE WITHOUT CONTRAST  TECHNIQUE: Multiplanar, multisequence MR imaging of the lumbar spine was performed. No intravenous contrast was administered.  COMPARISON:  09/13/2016  FINDINGS: Segmentation:  5 lumbar type vertebrae  Alignment:  Dextroscoliosis.  Straightening of the lumbar spine.  Vertebrae:  No fracture, evidence of discitis, or bone lesion.  Conus medullaris and cauda equina: Conus extends to the L1-2 level. Conus and cauda equina appear normal.  Paraspinal and other soft tissues: Aortic and iliac aneurysmal enlargement. The abdominal aorta measures up to 3.3 cm where visualized and left common iliac artery measures up to 2.9 cm  Disc levels:  T12- L1: Spondylosis.  No impingement  L1-L2: Spondylosis.  No impingement  L2-L3: Spondylosis with disc narrowing and mild bulging. No impingement  L3-L4: Severe degenerative disc disease with left-sided collapse and asymmetric spurring. There is moderate left foraminal stenosis. Left subarticular recess narrowing without static L4 compression  L4-L5: Asymmetric leftward degenerative disc collapse with bulge and endplate spurring. Bilateral subarticular recess stenosis with apparent flattening of the left L5 nerve root. There is moderate left more than right foraminal  narrowing  L5-S1:Spondylosis and disc degeneration with moderate borderline advanced right foraminal stenosis. The canal is patent  IMPRESSION: 1. Advanced degenerative disease with scoliosis. 2. L3-4 moderate left foraminal narrowing. 3. L4-5 left subarticular recess stenosis and L5 impingement. Moderate bilateral foraminal narrowing. 4. L5-S1 moderate right foraminal narrowing. 5. Aortic and iliac aneurysms without apparent change from 2019 abdominal CT.   Electronically Signed   By: Monte Fantasia M.D.   On: 08/09/2018 09:26   He reports that he quit smoking about 55 years ago. He quit smokeless tobacco use about 31 years ago.  His smokeless tobacco use included chew.  Recent Labs    05/24/18 1617 08/15/18 1117 02/20/19 1346  HGBA1C 6.3* 5.7* 6.3*    Objective:  VS:  HT:    WT:   BMI:     BP:(!) 175/112  HR:74bpm  TEMP: ( )  RESP:  Physical Exam  Ortho Exam Imaging: Xr C-arm No Report  Result Date: 04/02/2019 Please see Notes tab for imaging impression.   Past Medical/Family/Surgical/Social History: Medications & Allergies reviewed per EMR, new medications updated. Patient Active Problem List   Diagnosis Date Noted  . Chronic bilateral low back pain without sciatica 02/07/2019  . Statin intolerance 11/16/2018  . 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 Hyperlipidemia, mixed 08/15/2018  . Low back pain 07/18/2018  . Acute blood loss anemia 06/24/2018  . Upper GI bleed 06/24/2018  .  History of lacunar cerebrovascular accident (CVA) 02/12/2018  . Dizziness 02/12/2018  . Myofascial pain 12/18/2017  . Spondylosis without myelopathy or radiculopathy, lumbar region 12/18/2017  . Lumbar radiculopathy, right 12/18/2017  . Cholelithiasis 05/17/2017  . Sinus Bradycardia 05/17/2017  . Thoracic radiculopathy 04/12/2017  . Primary osteoarthritis of right knee 12/14/2016  . DDD (degenerative disc disease), lumbar 10/10/2016  . Lumbar facet arthropathy 08/29/2016   . Idiopathic scoliosis 03/22/2016  . Diabetic neuropathy ([place]) 03/22/2016  . Dysautonomia orthostatic hypotension syndrome ([place]) 02/25/2016  . CKD stage 3 due to type 2 diabetes mellitus ([place]) 02/19/2016  . Coronary artery disease involving coronary bypass graft of native heart without angina pectoris   . Diabetes mellitus type 2, diet-controlled ([place])   . 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 Medication management 08/11/2015  . 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 PVD (peripheral vascular disease) ([place]) 01/01/2014  . Hyperlipidemia associated with type 2 diabetes mellitus ([place]) 04/11/2013  . Supine hypertension   . Vitamin D deficiency   . Vitamin B 12 deficiency   . Coronary atherosclerosis- s/p PCI to LAD in 2009 and PCI to RCA in 2011 02/27/2009  . Abdominal aortic aneurysm ([place]) 02/27/2009  . GERD 02/27/2009   Past Medical History:  Diagnosis Date  . 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 Colon polyps   . Coronary atherosclerosis of unspecified type of vessel, native or graft   . Diabetes ([place])   . Difficult intubation   . Esophageal reflux   . High cholesterol   . History of IBS 02/27/2009  . Hypertension    pt denies, he says he has a h/o hypotension. If BP up he adjusts the [place]  . Orthostatic hypotension    "BP has been dropping alot when I stand up for the last month or so" (02/17/2016)  . Vitamin B 12 deficiency   . Vitamin D deficiency    Family History  Problem Relation Age of Onset  . Heart attack Father        died age 67  . Heart attack Brother        died age 68  . Anuerysm Brother        aortic  . Heart attack Sister        died age 75  . Colon cancer Sister   . Liver cancer Sister   . Diabetes Maternal Grandmother   . Arthritis Mother   . Dementia Mother   . Heart Problems Brother   . Heart Problems Brother   . Heart Problems Brother   . Heart Problems Brother   . Healthy Daughter   . Healthy Son   . Esophageal cancer Neg Hx   . Inflammatory bowel  disease Neg Hx   . Pancreatic cancer Neg Hx   . Stomach cancer Neg Hx    Past Surgical History:  Procedure Laterality Date  . BIOPSY  06/25/2018   Procedure: BIOPSY;  Surgeon: Rush Landmark Telford Nab., MD;  Location: [facility];  Service: Gastroenterology;;  . BLADDER SURGERY  1969   traumatic pelvic fractures, urethral and bladder repair  . CARDIAC CATHETERIZATION N/A 07/01/2015   Procedure: Left Heart Cath and Coronary Angiography;  Surgeon: Wellington Hampshire, MD;  Location: [facility];  Service: Cardiovascular;  Laterality: N/A;  . COLON RESECTION N/A 05/17/2017   Procedure: DIAGNOSTIC LAPAROSCOPY,;  Surgeon: Leighton Ruff, MD;  Location: [facility];  Service: General;  Laterality: N/A;  . CORONARY ANGIOPLASTY WITH STENT PLACEMENT    .  CORONARY ARTERY BYPASS GRAFT N/A 07/06/2015   Procedure: CORONARY ARTERY BYPASS GRAFTING (CABG)x 4   utilizing the left internal mammary artery and endoscopically harvested bilateral  sapheneous vein.;  Surgeon: Ivin Poot, MD;  Location: [facility];  Service: Open Heart Surgery;  Laterality: N/A;  . ESOPHAGOGASTRODUODENOSCOPY (EGD) WITH PROPOFOL N/A 06/25/2018   Procedure: ESOPHAGOGASTRODUODENOSCOPY (EGD) WITH PROPOFOL;  Surgeon: Rush Landmark Telford Nab., MD;  Location: [facility];  Service: Gastroenterology;  Laterality: N/A;  . KNEE SURGERY    . LOOP RECORDER INSERTION N/A 02/19/2018   Procedure: LOOP RECORDER INSERTION;  Surgeon: Deboraha Sprang, MD;  Location: [facility];  Service: Cardiovascular;  Laterality: N/A;  . TEE WITHOUT CARDIOVERSION N/A 07/06/2015   Procedure: TRANSESOPHAGEAL ECHOCARDIOGRAM (TEE);  Surgeon: Ivin Poot, MD;  Location: [facility];  Service: Open Heart Surgery;  Laterality: N/A;   Social History   Occupational History  . Occupation: Retired  Tobacco Use  . Smoking status: Former Smoker    Quit date: 07/16/1963    Years since quitting: 55.7  . Smokeless tobacco: Former Systems developer    Types: Chew    Quit date: 1989   Substance and Sexual Activity  . Alcohol use: No  . Drug use: No  . Sexual activity: Not Currently

## 2019-04-04 ENCOUNTER — Other Ambulatory Visit: Payer: Self-pay

## 2019-04-04 DIAGNOSIS — I714 Abdominal aortic aneurysm, without rupture, unspecified: Secondary | ICD-10-CM

## 2019-04-04 DIAGNOSIS — I723 Aneurysm of iliac artery: Secondary | ICD-10-CM

## 2019-04-09 ENCOUNTER — Telehealth: Payer: Self-pay | Admitting: Emergency Medicine

## 2019-04-09 NOTE — Progress Notes (Signed)
Subjective:    Patient ID: [name], male    DOB: 1933-09-25, 85 y.o.   MRN: [id number]  HPI       This very nice 85 y.o.MWMwith multiple co-morbidities including Dysautonomiawith  labile Supine HTN, & Postural Hypotension, HLD, ASCAD /CABG,ASCVD/Hx CVA's, T2_DM, Vitamin D Deficiency, recent  GI bleed requiring 2 units pRBC transfusion and is scheduled for EGD/Colon in 2 weeks. He also had a lumbar facet /nerve RFA 1 week ago by Dr Ernestina Patches.  Patient returns for 5 week f/u for f CP and his orthostatic Hypotension    Medication Sig  . acetaminophen  500 MG tablet Take 1,000 mg  2  times daily as needed   . AVITAMIN C 500 MG CAPS Take 1 capsule by mouth daily.  Marland Kitchen aspirin EC 81 MG tablet Take 1 tablet daily.  Marland Kitchen FIORICET tablet TAKE 1 Tab every 4 hr  As needed  . CALCIUM+D3 Take 1 tablet by mouth daily.  Marland Kitchen VITAMIN D 5000 units TABS Take 5,000 Units by mouth daily.  . Ferrous Sulfate ( 140 (45 Fe) MG TBCR Take 1 tablet by mouth daily.  . fludrocortisone (FLORINEF) 0.1 MG tablet TAKE 2 TABLETS  EVERY DAY  . gabapentin (NEURONTIN) 600 MG tablet Take 1/2  to 1   tablet 2 x /day for Chronic Pain  . glucose blood test strip Check blood sugar 1 time daily-DX-E11.9  . Magnesium 500 MG TABS Take 1 tablet by mouth daily.   . Multiple Vitamin  Take 1 tablet by mouth daily.  Marland Kitchen olmesartan (BENICAR) 40 MG tablet Take 20-40 mg by mouth as needed.  . Omega-3  FISH OIL Take 1 capsule by mouth daily.  . pantoprazole  40 MG tablet Take 1 tablet Daily for Indigestion & Heartburn  . polyethylene glycol  packet Take 17 g by mouth 2 times daily.   Marland Kitchen POTASSIUM CHLORIDE PO Take 595 mg by mouth daily.   . predniSONE  5 MG tablet Take 2 tablets  Morning & 1 tablet  Afternoon  . vitamin B-12  1000 MCG tablet Take 1,000 mcg by mouth daily.  . Zinc 50 MG TABS Take 1 tablet by mouth daily.  . DULoxetine  60 MG capsule Take 1 capsule Daily for Chronic Pain    Allergies  Allergen Reactions  . Cymbalta  [Duloxetine Hcl] Other (See Comments)    Dizziness, hallucinations.   . Keflex [Cephalexin] Other (See Comments)    dizziness  . Simvastatin Other (See Comments)    Joint pain  . Sudafed [Pseudoephedrine] Other (See Comments)    Dizziness   Past Medical History:  Diagnosis Date  . 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 Colon polyps   . Coronary atherosclerosis of unspecified type of vessel, native or graft   . Diabetes ([place])   . Difficult intubation   . Esophageal reflux   . High cholesterol   . History of IBS 02/27/2009  . Hypertension    pt denies, he says he has a h/o hypotension. If BP up he adjusts the Florinef  . Orthostatic hypotension    "BP has been dropping alot when I stand up for the last month or so" (02/17/2016)  . Vitamin B 12 deficiency   . Vitamin D deficiency    Past Surgical History:  Procedure Laterality Date  . BIOPSY  06/25/2018   Procedure: BIOPSY;  Surgeon: Rush Landmark Telford Nab., MD;  Location: [place];  Service: Gastroenterology;;  . BLADDER  SURGERY  1969   traumatic pelvic fractures, urethral and bladder repair  . CARDIAC CATHETERIZATION N/A 07/01/2015   Procedure: Left Heart Cath and Coronary Angiography;  Surgeon: Wellington Hampshire, MD;  Location: [facility];  Service: Cardiovascular;  Laterality: N/A;  . COLON RESECTION N/A 05/17/2017   Procedure: DIAGNOSTIC LAPAROSCOPY,;  Surgeon: Leighton Ruff, MD;  Location: [facility];  Service: General;  Laterality: N/A;  . CORONARY ANGIOPLASTY WITH STENT PLACEMENT    . CORONARY ARTERY BYPASS GRAFT N/A 07/06/2015   Procedure: CORONARY ARTERY BYPASS GRAFTING (CABG)x 4   utilizing the left internal mammary artery and endoscopically harvested bilateral  sapheneous vein.;  Surgeon: Ivin Poot, MD;  Location: [facility];  Service: Open Heart Surgery;  Laterality: N/A;  . ESOPHAGOGASTRODUODENOSCOPY (EGD) WITH PROPOFOL N/A 06/25/2018   Procedure: ESOPHAGOGASTRODUODENOSCOPY (EGD) WITH PROPOFOL;  Surgeon: Rush Landmark  Telford Nab., MD;  Location: [facility];  Service: Gastroenterology;  Laterality: N/A;  . KNEE SURGERY    . LOOP RECORDER INSERTION N/A 02/19/2018   Procedure: LOOP RECORDER INSERTION;  Surgeon: Deboraha Sprang, MD;  Location: [facility];  Service: Cardiovascular;  Laterality: N/A;  . TEE WITHOUT CARDIOVERSION N/A 07/06/2015   Procedure: TRANSESOPHAGEAL ECHOCARDIOGRAM (TEE);  Surgeon: Ivin Poot, MD;  Location: [facility];  Service: Open Heart Surgery;  Laterality: N/A;   Review of Systems   10 point systems review negative except as above.     Objective:   Physical Exam  BP (!) 200/104 Comment: 200/104- sitting and 170/92-standing  Pulse 64   Temp 97.9 F (36.6 C)   Resp 16   Ht 6' (1.829 m)   Wt 184 lb 9.6 oz (83.7 kg)   BMI 25.04 kg/m   Postural       Sit BP 194/117   P 70       &       Stand BP 144/92     P 88  HEENT - WNL. Neck - supple.  Chest - Clear equal BS. Cor - Nl HS. RRR w/o sig MGR. PP 1(+). No edema. MS- FROM w/o deformities.  Gait Nl. Neuro -  Nl w/o focal abnormalities    Assessment & Plan:   1. Supine hypertension  2. Dysautonomia orthostatic hypotension syndrome (HCC)  - continue home monitoring of postural BP's and titrating Florinef as current

## 2019-04-09 NOTE — Telephone Encounter (Signed)
LMOM Calling to have patient send manual transmission to evaluate Tachy episodes for 04/08/19. 3 total need to determine origin of episode and symptoms.

## 2019-04-09 NOTE — Telephone Encounter (Signed)
Wife called and will send manual transmission.

## 2019-04-10 ENCOUNTER — Other Ambulatory Visit: Payer: Self-pay

## 2019-04-10 ENCOUNTER — Ambulatory Visit: Payer: Medicare Other | Admitting: Internal Medicine

## 2019-04-10 VITALS — BP 200/104 | HR 64 | Temp 97.9°F | Resp 16 | Ht 72.0 in | Wt 184.6 lb

## 2019-04-10 DIAGNOSIS — G903 Multi-system degeneration of the autonomic nervous system: Secondary | ICD-10-CM | POA: Diagnosis not present

## 2019-04-10 DIAGNOSIS — I951 Orthostatic hypotension: Secondary | ICD-10-CM

## 2019-04-10 DIAGNOSIS — I10 Essential (primary) hypertension: Secondary | ICD-10-CM

## 2019-04-10 NOTE — Telephone Encounter (Signed)
Transmission received 04-09-2019. 

## 2019-04-11 NOTE — Telephone Encounter (Signed)
Transmission reviewed. SR/ST are tachy episodes.  [place], please call patient and offer device clinic appointment for device reprogramming.  Chanetta Marshall, NP 04/11/2019 12:45 PM

## 2019-04-13 ENCOUNTER — Encounter: Payer: Self-pay | Admitting: Internal Medicine

## 2019-04-16 ENCOUNTER — Ambulatory Visit: Payer: Medicare Other | Admitting: Physical Therapy

## 2019-04-16 ENCOUNTER — Telehealth: Payer: Self-pay | Admitting: *Deleted

## 2019-04-16 NOTE — Telephone Encounter (Signed)
Spoke with patient's wife, rescheduled LINQ reprogramming appointment to 04/19/19 at 11:30am due to elevators out of service.

## 2019-04-18 ENCOUNTER — Telehealth: Payer: Self-pay

## 2019-04-18 DIAGNOSIS — H35363 Drusen (degenerative) of macula, bilateral: Secondary | ICD-10-CM | POA: Diagnosis not present

## 2019-04-18 DIAGNOSIS — E119 Type 2 diabetes mellitus without complications: Secondary | ICD-10-CM | POA: Diagnosis not present

## 2019-04-18 LAB — HM DIABETES EYE EXAM

## 2019-04-18 NOTE — Telephone Encounter (Signed)
Per last note pt had intolerance to Repatha. Call pharmacy pt last refill it on 04/2018. PA will not be done. Pt release to PCP by NP.

## 2019-04-19 ENCOUNTER — Other Ambulatory Visit: Payer: Self-pay

## 2019-04-19 ENCOUNTER — Ambulatory Visit: Payer: Medicare Other | Admitting: *Deleted

## 2019-04-19 DIAGNOSIS — Z95818 Presence of other cardiac implants and grafts: Secondary | ICD-10-CM

## 2019-04-19 DIAGNOSIS — I639 Cerebral infarction, unspecified: Secondary | ICD-10-CM

## 2019-04-19 LAB — CUP PACEART INCLINIC DEVICE CHECK
Date Time Interrogation Session: 20201218120417
Implantable Pulse Generator Implant Date: 20191021

## 2019-04-19 NOTE — Patient Instructions (Signed)
Please send a manual transmission from your home monitor to update it with the changes made today.

## 2019-04-19 NOTE — Progress Notes (Signed)
Loop check in clinic for reprogramming. Battery status: good. No symptom, pause, brady, or AF episodes. 24 tachy episodes, tachy detection previously decreased to 128bpm to help determine episode onset--all episodes with onset available show gradual onset, median V rates 130s. Patient denies symptoms. Reprogrammed tachy detection to 158bpm, duration to 48 beats. Requested manual transmission to update changes in [facility]. Monthly summary reports and ROV with Dr. Caryl Comes PRN.

## 2019-04-20 ENCOUNTER — Other Ambulatory Visit (HOSPITAL_COMMUNITY)
Admission: RE | Admit: 2019-04-20 | Discharge: 2019-04-20 | Disposition: A | Discharge status: Home / Self Care | Payer: Medicare Other | Source: Ambulatory Visit | Attending: Gastroenterology | Admitting: Gastroenterology

## 2019-04-20 DIAGNOSIS — Z01812 Encounter for preprocedural laboratory examination: Secondary | ICD-10-CM | POA: Diagnosis not present

## 2019-04-20 DIAGNOSIS — Z20828 Contact with and (suspected) exposure to other viral communicable diseases: Secondary | ICD-10-CM | POA: Insufficient documentation

## 2019-04-21 LAB — NOVEL CORONAVIRUS, NAA (HOSP ORDER, SEND-OUT TO REF LAB; TAT 18-24 HRS): SARS-CoV-2, NAA: NOT DETECTED

## 2019-04-22 ENCOUNTER — Ambulatory Visit: Payer: Medicare Other | Admitting: Specialist

## 2019-04-22 NOTE — Progress Notes (Signed)
Spoke with Konrad Felix, PA for chart review since patient has history of difficult intubation. Ok to proceed.

## 2019-04-23 NOTE — Progress Notes (Signed)
Pre-op call done, patient sleeping info obtained from patient's wife- she states he has been quarantined and has not felt sick or has been around anyone sick. All questions addressed. Informed patient they should be here by 630 am.

## 2019-04-24 ENCOUNTER — Other Ambulatory Visit (HOSPITAL_COMMUNITY): Payer: Medicare Other

## 2019-04-24 ENCOUNTER — Other Ambulatory Visit: Payer: Self-pay

## 2019-04-24 ENCOUNTER — Ambulatory Visit (HOSPITAL_COMMUNITY): Payer: Medicare Other | Admitting: Physician Assistant

## 2019-04-24 ENCOUNTER — Encounter (HOSPITAL_COMMUNITY): Payer: Self-pay | Admitting: Gastroenterology

## 2019-04-24 ENCOUNTER — Ambulatory Visit (HOSPITAL_COMMUNITY)
Admission: RE | Admit: 2019-04-24 | Discharge: 2019-04-24 | Disposition: A | Discharge status: Home / Self Care | Payer: Medicare Other | Attending: Gastroenterology | Admitting: Gastroenterology

## 2019-04-24 ENCOUNTER — Encounter (HOSPITAL_COMMUNITY)
Admission: RE | Disposition: A | Discharge status: Home / Self Care | Payer: Self-pay | Source: Home / Self Care | Attending: Gastroenterology

## 2019-04-24 DIAGNOSIS — D124 Benign neoplasm of descending colon: Secondary | ICD-10-CM | POA: Diagnosis not present

## 2019-04-24 DIAGNOSIS — R1310 Dysphagia, unspecified: Secondary | ICD-10-CM | POA: Insufficient documentation

## 2019-04-24 DIAGNOSIS — D128 Benign neoplasm of rectum: Secondary | ICD-10-CM | POA: Insufficient documentation

## 2019-04-24 DIAGNOSIS — D509 Iron deficiency anemia, unspecified: Secondary | ICD-10-CM

## 2019-04-24 DIAGNOSIS — K5520 Angiodysplasia of colon without hemorrhage: Secondary | ICD-10-CM | POA: Diagnosis not present

## 2019-04-24 DIAGNOSIS — R109 Unspecified abdominal pain: Secondary | ICD-10-CM

## 2019-04-24 DIAGNOSIS — R1013 Epigastric pain: Secondary | ICD-10-CM | POA: Diagnosis not present

## 2019-04-24 DIAGNOSIS — Z86010 Personal history of colonic polyps: Secondary | ICD-10-CM

## 2019-04-24 DIAGNOSIS — Z951 Presence of aortocoronary bypass graft: Secondary | ICD-10-CM | POA: Insufficient documentation

## 2019-04-24 DIAGNOSIS — E538 Deficiency of other specified B group vitamins: Secondary | ICD-10-CM | POA: Diagnosis not present

## 2019-04-24 DIAGNOSIS — E7800 Pure hypercholesterolemia, unspecified: Secondary | ICD-10-CM | POA: Diagnosis not present

## 2019-04-24 DIAGNOSIS — K269 Duodenal ulcer, unspecified as acute or chronic, without hemorrhage or perforation: Secondary | ICD-10-CM | POA: Diagnosis not present

## 2019-04-24 DIAGNOSIS — K621 Rectal polyp: Secondary | ICD-10-CM

## 2019-04-24 DIAGNOSIS — K3189 Other diseases of stomach and duodenum: Secondary | ICD-10-CM | POA: Diagnosis not present

## 2019-04-24 DIAGNOSIS — D123 Benign neoplasm of transverse colon: Secondary | ICD-10-CM | POA: Diagnosis not present

## 2019-04-24 DIAGNOSIS — Z79899 Other long term (current) drug therapy: Secondary | ICD-10-CM | POA: Diagnosis not present

## 2019-04-24 DIAGNOSIS — I10 Essential (primary) hypertension: Secondary | ICD-10-CM | POA: Insufficient documentation

## 2019-04-24 DIAGNOSIS — K264 Chronic or unspecified duodenal ulcer with hemorrhage: Secondary | ICD-10-CM | POA: Diagnosis not present

## 2019-04-24 DIAGNOSIS — Z7982 Long term (current) use of aspirin: Secondary | ICD-10-CM | POA: Diagnosis not present

## 2019-04-24 DIAGNOSIS — Z87891 Personal history of nicotine dependence: Secondary | ICD-10-CM | POA: Insufficient documentation

## 2019-04-24 DIAGNOSIS — K219 Gastro-esophageal reflux disease without esophagitis: Secondary | ICD-10-CM | POA: Diagnosis not present

## 2019-04-24 DIAGNOSIS — R935 Abnormal findings on diagnostic imaging of other abdominal regions, including retroperitoneum: Secondary | ICD-10-CM

## 2019-04-24 DIAGNOSIS — K635 Polyp of colon: Secondary | ICD-10-CM | POA: Diagnosis not present

## 2019-04-24 DIAGNOSIS — E119 Type 2 diabetes mellitus without complications: Secondary | ICD-10-CM | POA: Insufficient documentation

## 2019-04-24 DIAGNOSIS — Z8719 Personal history of other diseases of the digestive system: Secondary | ICD-10-CM

## 2019-04-24 DIAGNOSIS — Z800 Family history of malignant neoplasm of digestive organs: Secondary | ICD-10-CM

## 2019-04-24 DIAGNOSIS — Z1211 Encounter for screening for malignant neoplasm of colon: Secondary | ICD-10-CM | POA: Insufficient documentation

## 2019-04-24 DIAGNOSIS — D649 Anemia, unspecified: Secondary | ICD-10-CM | POA: Diagnosis not present

## 2019-04-24 DIAGNOSIS — I2510 Atherosclerotic heart disease of native coronary artery without angina pectoris: Secondary | ICD-10-CM | POA: Diagnosis not present

## 2019-04-24 DIAGNOSIS — K921 Melena: Secondary | ICD-10-CM

## 2019-04-24 DIAGNOSIS — E782 Mixed hyperlipidemia: Secondary | ICD-10-CM | POA: Diagnosis not present

## 2019-04-24 HISTORY — PX: SUBMUCOSAL LIFTING INJECTION: SHX6855

## 2019-04-24 HISTORY — PX: BIOPSY: SHX5522

## 2019-04-24 HISTORY — PX: POLYPECTOMY: SHX5525

## 2019-04-24 HISTORY — PX: HEMOSTASIS CLIP PLACEMENT: SHX6857

## 2019-04-24 HISTORY — PX: ESOPHAGEAL DILATION: SHX303

## 2019-04-24 HISTORY — PX: ESOPHAGOGASTRODUODENOSCOPY (EGD) WITH PROPOFOL: SHX5813

## 2019-04-24 HISTORY — PX: COLONOSCOPY WITH PROPOFOL: SHX5780

## 2019-04-24 LAB — GLUCOSE, CAPILLARY: Glucose-Capillary: 113 mg/dL — ABNORMAL HIGH (ref 70–99)

## 2019-04-24 SURGERY — COLONOSCOPY WITH PROPOFOL
Anesthesia: Monitor Anesthesia Care

## 2019-04-24 MED ORDER — LACTATED RINGERS IV SOLN
INTRAVENOUS | Status: DC
  Administered 2019-04-24: 1000 mL via INTRAVENOUS

## 2019-04-24 MED ORDER — PROPOFOL 10 MG/ML IV BOLUS
INTRAVENOUS | Status: AC
  Filled 2019-04-24: qty 20

## 2019-04-24 MED ORDER — LIDOCAINE 2% (20 MG/ML) 5 ML SYRINGE
INTRAMUSCULAR | Status: DC | PRN
  Administered 2019-04-24: 80 mg via INTRAVENOUS

## 2019-04-24 MED ORDER — PROPOFOL 500 MG/50ML IV EMUL
INTRAVENOUS | Status: AC
  Filled 2019-04-24: qty 50

## 2019-04-24 MED ORDER — SODIUM CHLORIDE 0.9 % IV SOLN: INTRAVENOUS | Status: DC

## 2019-04-24 MED ORDER — PROPOFOL 500 MG/50ML IV EMUL
INTRAVENOUS | Status: DC | PRN
  Administered 2019-04-24: 100 ug/kg/min via INTRAVENOUS

## 2019-04-24 MED ORDER — SODIUM CHLORIDE (PF) 0.9 % IJ SOLN: INTRAMUSCULAR | Status: DC | PRN

## 2019-04-24 MED ORDER — PROPOFOL 10 MG/ML IV BOLUS
INTRAVENOUS | Status: DC | PRN
  Administered 2019-04-24 (×4): 10 mg via INTRAVENOUS
  Administered 2019-04-24: 20 mg via INTRAVENOUS
  Administered 2019-04-24 (×3): 10 mg via INTRAVENOUS

## 2019-04-24 SURGICAL SUPPLY — 25 items

## 2019-04-24 NOTE — Discharge Instructions (Signed)
YOU HAD AN ENDOSCOPIC PROCEDURE TODAY: Refer to the procedure report and other information in the discharge instructions given to you for any specific questions about what was found during the examination. If this information does not answer your questions, please call [facility] office at [phone] to clarify.  ° °YOU SHOULD EXPECT: Some feelings of bloating in the abdomen. Passage of more gas than usual. Walking can help get rid of the air that was put into your GI tract during the procedure and reduce the bloating. If you had a lower endoscopy (such as a colonoscopy or flexible sigmoidoscopy) you may notice spotting of blood in your stool or on the toilet paper. Some abdominal soreness may be present for a day or two, also. ° °DIET: Your first meal following the procedure should be a light meal and then it is ok to progress to your normal diet. A half-sandwich or bowl of soup is an example of a good first meal. Heavy or fried foods are harder to digest and may make you feel nauseous or bloated. Drink plenty of fluids but you should avoid alcoholic beverages for 24 hours. If you had a esophageal dilation, please see attached instructions for diet.   ° °ACTIVITY: Your care partner should take you home directly after the procedure. You should plan to take it easy, moving slowly for the rest of the day. You can resume normal activity the day after the procedure however YOU SHOULD NOT DRIVE, use power tools, machinery or perform tasks that involve climbing or major physical exertion for 24 hours (because of the sedation medicines used during the test).  ° °SYMPTOMS TO REPORT IMMEDIATELY: °A gastroenterologist can be reached at any hour. Please call [phone]  for any of the following symptoms:  °Following lower endoscopy (colonoscopy, flexible sigmoidoscopy) °Excessive amounts of blood in the stool  °Significant tenderness, worsening of abdominal pains  °Swelling of the abdomen that is new, acute  °Fever of 100° or  higher  °Following upper endoscopy (EGD, EUS, ERCP, esophageal dilation) °Vomiting of blood or coffee ground material  °New, significant abdominal pain  °New, significant chest pain or pain under the shoulder blades  °Painful or persistently difficult swallowing  °New shortness of breath  °Black, tarry-looking or red, bloody stools ° °FOLLOW UP:  °If any biopsies were taken you will be contacted by phone or by letter within the next 1-3 weeks. Call [phone]  if you have not heard about the biopsies in 3 weeks.  °Please also call with any specific questions about appointments or follow up tests. ° °

## 2019-04-24 NOTE — Anesthesia Preprocedure Evaluation (Signed)
Anesthesia Evaluation  Patient identified by MRN, date of birth, ID band Patient awake    Reviewed: Allergy & Precautions, NPO status , Patient's Chart, lab work & pertinent test results  Airway Mallampati: II  TM Distance: >3 FB Neck ROM: Full    Dental  (+) Edentulous Upper   Pulmonary former smoker,    breath sounds clear to auscultation       Cardiovascular hypertension,  Rhythm:Regular Rate:Normal     Neuro/Psych    GI/Hepatic   Endo/Other  diabetes  Renal/GU      Musculoskeletal   Abdominal   Peds  Hematology   Anesthesia Other Findings   Reproductive/Obstetrics                             Anesthesia Physical Anesthesia Plan  ASA: III  Anesthesia Plan: MAC   Post-op Pain Management:    Induction:   PONV Risk Score and Plan:   Airway Management Planned: Natural Airway and Nasal Cannula  Additional Equipment:   Intra-op Plan:   Post-operative Plan:   Informed Consent: I have reviewed the patients History and Physical, chart, labs and discussed the procedure including the risks, benefits and alternatives for the proposed anesthesia with the patient or authorized representative who has indicated his/her understanding and acceptance.     Dental advisory given  Plan Discussed with: CRNA and Anesthesiologist  Anesthesia Plan Comments:         Anesthesia Quick Evaluation

## 2019-04-24 NOTE — Progress Notes (Signed)
ILR remote 

## 2019-04-24 NOTE — Anesthesia Postprocedure Evaluation (Signed)
Anesthesia Post Note  Patient: [name]  Procedure(s) Performed: COLONOSCOPY WITH PROPOFOL (N/A ) ESOPHAGOGASTRODUODENOSCOPY (EGD) WITH PROPOFOL (N/A ) ESOPHAGEAL DILATION BIOPSY POLYPECTOMY SUBMUCOSAL LIFTING INJECTION HEMOSTASIS CLIP PLACEMENT     Patient location during evaluation: Endoscopy Anesthesia Type: MAC Level of consciousness: awake and alert Pain management: pain level controlled Vital Signs Assessment: post-procedure vital signs reviewed and stable Respiratory status: spontaneous breathing, nonlabored ventilation, respiratory function stable and patient connected to nasal cannula oxygen Cardiovascular status: stable and blood pressure returned to baseline Postop Assessment: no apparent nausea or vomiting Anesthetic complications: no    Last Vitals:  Vitals:   04/24/19 0900 04/24/19 0910  BP: (!) 162/71 (!) 175/92  Pulse: (!) 57   Resp: 16 14  Temp:    SpO2: 100% 100%    Last Pain:  Vitals:   04/24/19 0910  TempSrc:   PainSc: 0-No pain                 Christena Sunderlin COKER

## 2019-04-24 NOTE — Op Note (Signed)
[facility] Patient Name: [name] Procedure Date: 04/24/2019 MRN: [id number] Attending MD: Justice Britain , MD Date of Birth: May 30, 1933 CSN: [id number] Age: 85 Admit Type: Outpatient Procedure:                Colonoscopy Indications:              High risk colon cancer surveillance: Personal                            history of colonic polyps Providers:                Justice Britain, MD, Burtis Junes, RN, Elspeth Cho Tech., Technician, Adair Laundry, CRNA Referring MD:              Medicines:                Monitored Anesthesia Care Complications:            No immediate complications. Estimated Blood Loss:     Estimated blood loss was minimal. Procedure:                Pre-Anesthesia Assessment:                           - Prior to the procedure, a History and Physical                            was performed, and patient medications and                            allergies were reviewed. The patient's tolerance of                            previous anesthesia was also reviewed. The risks                            and benefits of the procedure and the sedation                            options and risks were discussed with the patient.                            All questions were answered, and informed consent                            was obtained. Prior Anticoagulants: The patient has                            taken no previous anticoagulant or antiplatelet                            agents except for aspirin. ASA Grade Assessment:  III - A patient with severe systemic disease. After                            reviewing the risks and benefits, the patient was                            deemed in satisfactory condition to undergo the                            procedure.                           After obtaining informed consent, the colonoscope                            was passed under  direct vision. Throughout the                            procedure, the patient's blood pressure, pulse, and                            oxygen saturations were monitored continuously. The                            CF-HQ190L ([id number]) Olympus colonoscope was                            introduced through the anus and advanced to the the                            cecum, identified by appendiceal orifice and                            ileocecal valve. The colonoscopy was performed                            without difficulty. The patient tolerated the                            procedure. The quality of the bowel preparation was                            adequate. The ileocecal valve, appendiceal orifice,                            and rectum were photographed. Scope In: 8:02:59 AM Scope Out: 8:35:58 AM Scope Withdrawal Time: 0 hours 27 minutes 28 seconds  Total Procedure Duration: 0 hours 32 minutes 59 seconds  Findings:      Hemorrhoids were found on perianal exam.      The left colon was moderately tortuous.      A single medium-sized angioectasia with typical arborization was found       in the proximal ascending colon.      A 20 mm polypoid lesion was found in the distal ascending colon. The  lesion was sessile - query sessile serrated adenoma. No bleeding was       present. Preparations were made for mucosal resection. Saline was       injected to raise the lesion. Piecemeal cold mucosal resection using a       snare was performed. Resection and retrieval were complete. To prevent       bleeding after mucosal resection, three hemostatic clips were       successfully placed (MR conditional). There was no bleeding at the end       of the procedure.      Five sessile polyps were found in the rectum (1), descending colon (1)       and transverse colon (3). The polyps were 2 to 6 mm in size. These       polyps were removed with a cold snare. Resection and retrieval were        complete.      Non-bleeding non-thrombosed external and internal hemorrhoids were found       during retroflexion, during perianal exam and during digital exam. The       hemorrhoids were Grade II (internal hemorrhoids that prolapse but reduce       spontaneously). Impression:               - Hemorrhoids found on perianal exam.                           - Tortuous colon.                           - A single colonic angioectasia in AC.                           - Polypoid lesion in the distal ascending colon.                            Complete removal was accomplished via mucosal                            resection. Clips (MR conditional) were placed.                           - Five 2 to 6 mm polyps in the rectum, in the                            descending colon and in the transverse colon,                            removed with a cold snare. Resected and retrieved.                           - Non-bleeding non-thrombosed external and internal                            hemorrhoids. Moderate Sedation:      Not Applicable - Patient had care per Anesthesia. Recommendation:           - The patient will be observed post-procedure,  until all discharge criteria are met.                           - Discharge patient to home.                           - Patient has a contact number available for                            emergencies. The signs and symptoms of potential                            delayed complications were discussed with the                            patient. Return to normal activities tomorrow.                            Written discharge instructions were provided to the                            patient.                           - Monitor for signs/symptoms of bleeding,                            perforation, and infection. If issues please call                            our number to get further assistance as needed.                            - High fiber diet.                           - Use FiberCon 1 tablet PO daily.                           - Take Sitz baths QHS as needed.                           - May continue Aspirin daily but try to minimize                            NSAIDs otherwise for the next 1-week.                           - Await pathology results.                           - Repeat colonoscopy for surveillance based on                            pathology results. If patient has evidence of  adenomatous tissue on the EMR site from [facility] then                            would repeat Colonoscopy in 6-12 months. If no                            adenomatous tissue then will be based on final                            pathology from the rest of the colon polyps - most                            likely 3-years. However, with his age, would have                            to have discussion with him and wife prior to                            scheduling any further procedures as well as                            discuss with PCP.                           - The findings and recommendations were discussed                            with the patient.                           - The findings and recommendations were discussed                            with the patient's family. Procedure Code(s):        --- Professional ---                           [phone], Colonoscopy, flexible; with endoscopic                            mucosal resection                           [id number], 29, Colonoscopy, flexible; with removal of                            tumor(s), polyp(s), or other lesion(s) by snare                            technique Diagnosis Code(s):        --- Professional ---                           Z86.010, Personal history of colonic polyps  K64.1, Second degree hemorrhoids                           K55.20, Angiodysplasia of colon without hemorrhage                            D49.0, Neoplasm of unspecified behavior of                            digestive system                           K62.1, Rectal polyp                           K63.5, Polyp of colon                           Q43.8, Other specified congenital malformations of                            intestine CPT copyright 2019 [employer]. All rights reserved. The codes documented in this report are preliminary and upon coder review may  be revised to meet current compliance requirements. Justice Britain, MD 04/24/2019 8:59:02 AM Number of Addenda: 0

## 2019-04-24 NOTE — H&P (Signed)
GASTROENTEROLOGY PROCEDURE H&P NOTE   Primary Care Physician: [name], MD  HPI: [name] is a 85 y.o. male who presents for EGD/Colonoscopy with possible dilation.  Past Medical History:  Diagnosis Date  . 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 Colon polyps   . Coronary atherosclerosis of unspecified type of vessel, native or graft   . Diabetes ([place])   . Difficult intubation   . Esophageal reflux   . High cholesterol   . History of IBS 02/27/2009  . Hypertension    pt denies, he says he has a h/o hypotension. If BP up he adjusts the [place]  . Orthostatic hypotension    "BP has been dropping alot when I stand up for the last month or so" (02/17/2016)  . Vitamin B 12 deficiency   . Vitamin D deficiency    Past Surgical History:  Procedure Laterality Date  . BIOPSY  06/25/2018   Procedure: BIOPSY;  Surgeon: Rush Landmark Telford Nab., MD;  Location: [facility];  Service: Gastroenterology;;  . BLADDER SURGERY  1969   traumatic pelvic fractures, urethral and bladder repair  . CARDIAC CATHETERIZATION N/A 07/01/2015   Procedure: Left Heart Cath and Coronary Angiography;  Surgeon: Wellington Hampshire, MD;  Location: [facility];  Service: Cardiovascular;  Laterality: N/A;  . COLON RESECTION N/A 05/17/2017   Procedure: DIAGNOSTIC LAPAROSCOPY,;  Surgeon: Leighton Ruff, MD;  Location: [facility];  Service: General;  Laterality: N/A;  . CORONARY ANGIOPLASTY WITH STENT PLACEMENT    . CORONARY ARTERY BYPASS GRAFT N/A 07/06/2015   Procedure: CORONARY ARTERY BYPASS GRAFTING (CABG)x 4   utilizing the left internal mammary artery and endoscopically harvested bilateral  sapheneous vein.;  Surgeon: Ivin Poot, MD;  Location: [facility];  Service: Open Heart Surgery;  Laterality: N/A;  . ESOPHAGOGASTRODUODENOSCOPY (EGD) WITH PROPOFOL N/A 06/25/2018   Procedure: ESOPHAGOGASTRODUODENOSCOPY (EGD) WITH PROPOFOL;  Surgeon: Rush Landmark Telford Nab., MD;  Location: [facility];  Service:  Gastroenterology;  Laterality: N/A;  . KNEE SURGERY    . LOOP RECORDER INSERTION N/A 02/19/2018   Procedure: LOOP RECORDER INSERTION;  Surgeon: Deboraha Sprang, MD;  Location: [facility];  Service: Cardiovascular;  Laterality: N/A;  . TEE WITHOUT CARDIOVERSION N/A 07/06/2015   Procedure: TRANSESOPHAGEAL ECHOCARDIOGRAM (TEE);  Surgeon: Ivin Poot, MD;  Location: [facility];  Service: Open Heart Surgery;  Laterality: N/A;   Current Facility-Administered Medications  Medication Dose Route Frequency Provider Last Rate Last Admin  . 0.9 %  sodium chloride infusion   Intravenous Continuous Mansouraty, Telford Nab., MD       Allergies  Allergen Reactions  . Cymbalta [Duloxetine Hcl] Other (See Comments)    Dizziness, hallucinations.   . Keflex [Cephalexin] Other (See Comments)    dizziness  . Simvastatin Other (See Comments)    Joint pain  . Sudafed [Pseudoephedrine] Other (See Comments)    Dizziness   Family History  Problem Relation Age of Onset  . Heart attack Father        died age 56  . Heart attack Brother        died age 62  . Anuerysm Brother        aortic  . Heart attack Sister        died age 55  . Colon cancer Sister   . Liver cancer Sister   . Diabetes Maternal Grandmother   . Arthritis Mother   . Dementia Mother   . Heart Problems Brother   .  Heart Problems Brother   . Heart Problems Brother   . Heart Problems Brother   . Healthy Daughter   . Healthy Son   . Esophageal cancer Neg Hx   . Inflammatory bowel disease Neg Hx   . Pancreatic cancer Neg Hx   . Stomach cancer Neg Hx    Social History   Socioeconomic History  . Marital status: Married    Spouse name: Not on file  . Number of children: 2  . Years of education: Not on file  . Highest education level: High school graduate  Occupational History  . Occupation: Retired  Tobacco Use  . Smoking status: Former Smoker    Quit date: 07/16/1963    Years since quitting: 55.8  . Smokeless tobacco:  Former Systems developer    Types: Chew    Quit date: 1989  Substance and Sexual Activity  . Alcohol use: No  . Drug use: No  . Sexual activity: Not Currently  Other Topics Concern  . Not on file  Social History Narrative   Lives at home with his wife Estill Bamberg   Right handed   No caffeine   Social Determinants of Health   Financial Resource Strain:   . Difficulty of Paying Living Expenses: Not on file  Food Insecurity:   . Worried About Charity fundraiser in the Last Year: Not on file  . Ran Out of Food in the Last Year: Not on file  Transportation Needs:   . Lack of Transportation (Medical): Not on file  . Lack of Transportation (Non-Medical): Not on file  Physical Activity:   . Days of Exercise per Week: Not on file  . Minutes of Exercise per Session: Not on file  Stress:   . Feeling of Stress : Not on file  Social Connections:   . Frequency of Communication with Friends and Family: Not on file  . Frequency of Social Gatherings with Friends and Family: Not on file  . Attends Religious Services: Not on file  . Active Member of Clubs or Organizations: Not on file  . Attends Archivist Meetings: Not on file  . Marital Status: Not on file  Intimate Partner Violence:   . Fear of Current or Ex-Partner: Not on file  . Emotionally Abused: Not on file  . Physically Abused: Not on file  . Sexually Abused: Not on file    Physical Exam: Vital signs in last 24 hours:     GEN: NAD EYE: Sclerae anicteric ENT: MMM CV: Non-tachycardic GI: Soft, TTP in abdomen NEURO:  Alert & Oriented x 3  Lab Results: No results for input(s): WBC, HGB, HCT, PLT in the last 72 hours. BMET No results for input(s): NA, K, CL, CO2, GLUCOSE, BUN, CREATININE, CALCIUM in the last 72 hours. LFT No results for input(s): PROT, ALBUMIN, AST, ALT, ALKPHOS, BILITOT, BILIDIR, IBILI in the last 72 hours. PT/INR No results for input(s): LABPROT, INR in the last 72 hours.   Impression / Plan: This is a  85 y.o.male who presents for EGD/Colonoscopy with possible Dilation.  The risks and benefits of endoscopic evaluation were discussed with the patient; these include but are not limited to the risk of perforation, infection, bleeding, missed lesions, lack of diagnosis, severe illness requiring hospitalization, as well as anesthesia and sedation related illnesses.  The patient is agreeable to proceed.    Justice Britain, MD [facility] Office # [phone]

## 2019-04-24 NOTE — Transfer of Care (Signed)
Immediate Anesthesia Transfer of Care Note  Patient: [name]  Procedure(s) Performed: COLONOSCOPY WITH PROPOFOL (N/A ) ESOPHAGOGASTRODUODENOSCOPY (EGD) WITH PROPOFOL (N/A ) ESOPHAGEAL DILATION BIOPSY POLYPECTOMY SUBMUCOSAL LIFTING INJECTION HEMOSTASIS CLIP PLACEMENT  Patient Location: Endoscopy Unit  Anesthesia Type:MAC  Level of Consciousness: drowsy  Airway & Oxygen Therapy: Patient Spontanous Breathing and Patient connected to nasal cannula oxygen  Post-op Assessment: Report given to RN and Post -op Vital signs reviewed and stable  Post vital signs: Reviewed and stable  Last Vitals:  Vitals Value Taken Time  BP 157/84 04/24/19 0845  Temp    Pulse 60 04/24/19 0848  Resp 17 04/24/19 0848  SpO2 100 % 04/24/19 0848  Vitals shown include unvalidated device data.  Last Pain:  Vitals:   04/24/19 0845  TempSrc:   PainSc: 0-No pain         Complications: No apparent anesthesia complications

## 2019-04-24 NOTE — Op Note (Signed)
[facility] Patient Name: [name] Procedure Date: 04/24/2019 MRN: [id number] Attending MD: Justice Britain , MD Date of Birth: July 16, 1933 CSN: [id number] Age: 85 Admit Type: Outpatient Procedure:                Upper GI endoscopy Indications:              Epigastric abdominal pain, Dysphagia, Follow-up of                            duodenal ulcer with hemorrhage Providers:                Justice Britain, MD, Burtis Junes, RN, Elspeth Cho Tech., Technician, Adair Laundry, CRNA Referring MD:             [name] Medicines:                Monitored Anesthesia Care Complications:            No immediate complications. Estimated Blood Loss:     Estimated blood loss was minimal. Procedure:                Pre-Anesthesia Assessment:                           - Prior to the procedure, a History and Physical                            was performed, and patient medications and                            allergies were reviewed. The patient's tolerance of                            previous anesthesia was also reviewed. The risks                            and benefits of the procedure and the sedation                            options and risks were discussed with the patient.                            All questions were answered, and informed consent                            was obtained. Prior Anticoagulants: The patient has                            taken no previous anticoagulant or antiplatelet                            agents except for aspirin. ASA Grade Assessment:  III - A patient with severe systemic disease. After                            reviewing the risks and benefits, the patient was                            deemed in satisfactory condition to undergo the                            procedure.                           After obtaining informed consent, the endoscope was                             passed under direct vision. Throughout the                            procedure, the patient's blood pressure, pulse, and                            oxygen saturations were monitored continuously. The                            [id number]) Olympus gastroscope was                            introduced through the mouth, and advanced to the                            second part of duodenum. The upper GI endoscopy was                            accomplished without difficulty. The patient                            tolerated the procedure. Scope In: Scope Out: Findings:      Small numular white lesions that washed off were noted in the proximal       esophagus. Otherwise, no gross lesions were noted in the entire       esophagus. Biopsies were taken with a cold forceps for histology to rule       out EoE. Then a guidewire was placed and the scope was withdrawn.       Dilation was performed with a Savary dilator with no resistance at 16 mm       and mild resistance at 18 mm. The dilation site was examined following       endoscope reinsertion and showed no change and no perforation.      Patchy mildly erythematous mucosa without bleeding was found in the       gastric body.      No other gross lesions were noted in the entire examined stomach.       Biopsies were taken with a cold forceps for histology and Helicobacter       pylori testing.  A small scar from prior healed ulcer was found in the D1/D2 sweep.      No other gross lesions were noted in the duodenal bulb, in the first       portion of the duodenum and in the second portion of the duodenum. Impression:               - Washable numular lesions in proximal esophagus                            but no other gross lesions in esophagus. Biopsied.                            Dilated esophagus.                           - Erythematous mucosa in the gastric body. No other                            gross lesions  in the stomach. Biopsied.                           - Duodenal scar from prior healed ulcer. No gross                            lesions in the duodenal bulb, in the first portion                            of the duodenum and in the second portion of the                            duodenum. Moderate Sedation:      Not Applicable - Patient had care per Anesthesia. Recommendation:           - Proceed to scheduled colonoscopy.                           - Await pathology results.                           - Observe patient's clinical course.                           - Continue once daily PPI.                           - If patient continues to have complaints of                            dysphagia, would be worthwhile to consider                            Esophageal Manometry.                           - The findings and recommendations were discussed  with the patient.                           - The findings and recommendations were discussed                            with the patient's family. Procedure Code(s):        --- Professional ---                           [phone], Esophagogastroduodenoscopy, flexible,                            transoral; with insertion of guide wire followed by                            passage of dilator(s) through esophagus over guide                            wire Diagnosis Code(s):        --- Professional ---                           K31.89, Other diseases of stomach and duodenum                           R10.13, Epigastric pain                           R13.10, Dysphagia, unspecified                           K26.4, Chronic or unspecified duodenal ulcer with                            hemorrhage CPT copyright 2019 [employer]. All rights reserved. The codes documented in this report are preliminary and upon coder review may  be revised to meet current compliance requirements. Justice Britain,  MD 04/24/2019 8:50:34 AM Number of Addenda: 0

## 2019-04-25 ENCOUNTER — Other Ambulatory Visit: Payer: Self-pay

## 2019-04-25 LAB — SURGICAL PATHOLOGY

## 2019-04-29 ENCOUNTER — Encounter: Payer: Self-pay | Admitting: Gastroenterology

## 2019-04-30 ENCOUNTER — Other Ambulatory Visit: Payer: Self-pay

## 2019-04-30 ENCOUNTER — Ambulatory Visit (INDEPENDENT_AMBULATORY_CARE_PROVIDER_SITE_OTHER): Payer: Medicare Other | Admitting: Physical Therapy

## 2019-04-30 ENCOUNTER — Other Ambulatory Visit: Payer: Self-pay | Admitting: Radiology

## 2019-04-30 DIAGNOSIS — M5441 Lumbago with sciatica, right side: Secondary | ICD-10-CM

## 2019-04-30 DIAGNOSIS — M6281 Muscle weakness (generalized): Secondary | ICD-10-CM | POA: Diagnosis not present

## 2019-04-30 DIAGNOSIS — R29818 Other symptoms and signs involving the nervous system: Secondary | ICD-10-CM

## 2019-04-30 DIAGNOSIS — R2689 Other abnormalities of gait and mobility: Secondary | ICD-10-CM

## 2019-04-30 DIAGNOSIS — G8929 Other chronic pain: Secondary | ICD-10-CM | POA: Diagnosis not present

## 2019-04-30 MED ORDER — OXYCODONE-ACETAMINOPHEN 5-325 MG PO TABS: 1.0000 | ORAL_TABLET | Freq: Four times a day (QID) | ORAL | 0 refills | Status: DC | PRN

## 2019-04-30 NOTE — Therapy (Signed)
[facility] [address] [place], [place], [address] Phone: [phone]   Fax:  [phone]  Physical Therapy Evaluation  Patient Details  Name: [name] MRN: [id number] Date of Birth: 1933/09/07 Referring Provider (PT): [name], MD   Encounter Date: 04/30/2019  PT End of Session - 04/30/19 1532    Visit Number  1    Number of Visits  12    Date for PT Re-Evaluation  06/11/19    Authorization Type  [employer] MCR    PT Start Time  1100    PT Stop Time  1145    PT Time Calculation (min)  45 min    Activity Tolerance  Patient tolerated treatment well    Behavior During Therapy  [facility] for tasks assessed/performed       Past Medical History:  Diagnosis Date  . 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 Colon polyps   . Coronary atherosclerosis of unspecified type of vessel, native or graft   . Diabetes ([place])   . Difficult intubation   . Esophageal reflux   . High cholesterol   . History of IBS 02/27/2009  . Hypertension    pt denies, he says he has a h/o hypotension. If BP up he adjusts the [place]  . Orthostatic hypotension    "BP has been dropping alot when I stand up for the last month or so" (02/17/2016)  . Vitamin B 12 deficiency   . Vitamin D deficiency     Past Surgical History:  Procedure Laterality Date  . BIOPSY  06/25/2018   Procedure: BIOPSY;  Surgeon: Rush Landmark Telford Nab., MD;  Location: [place];  Service: Gastroenterology;;  . BIOPSY  04/24/2019   Procedure: BIOPSY;  Surgeon: Irving Copas., MD;  Location: [facility];  Service: Gastroenterology;;  . BLADDER SURGERY  1969   traumatic pelvic fractures, urethral and bladder repair  . CARDIAC CATHETERIZATION N/A 07/01/2015   Procedure: Left Heart Cath and Coronary Angiography;  Surgeon: Wellington Hampshire, MD;  Location: [facility];  Service: Cardiovascular;  Laterality: N/A;  . COLON RESECTION N/A 05/17/2017   Procedure: DIAGNOSTIC LAPAROSCOPY,;   Surgeon: Leighton Ruff, MD;  Location: [facility];  Service: General;  Laterality: N/A;  . COLONOSCOPY WITH PROPOFOL N/A 04/24/2019   Procedure: COLONOSCOPY WITH PROPOFOL;  Surgeon: Irving Copas., MD;  Location: [facility];  Service: Gastroenterology;  Laterality: N/A;  . CORONARY ANGIOPLASTY WITH STENT PLACEMENT    . CORONARY ARTERY BYPASS GRAFT N/A 07/06/2015   Procedure: CORONARY ARTERY BYPASS GRAFTING (CABG)x 4   utilizing the left internal mammary artery and endoscopically harvested bilateral  sapheneous vein.;  Surgeon: Ivin Poot, MD;  Location: [facility];  Service: Open Heart Surgery;  Laterality: N/A;  . ESOPHAGEAL DILATION  04/24/2019   Procedure: ESOPHAGEAL DILATION;  Surgeon: Rush Landmark Telford Nab., MD;  Location: [facility];  Service: Gastroenterology;;  . ESOPHAGOGASTRODUODENOSCOPY (EGD) WITH PROPOFOL N/A 06/25/2018   Procedure: ESOPHAGOGASTRODUODENOSCOPY (EGD) WITH PROPOFOL;  Surgeon: Irving Copas., MD;  Location: [place];  Service: Gastroenterology;  Laterality: N/A;  . ESOPHAGOGASTRODUODENOSCOPY (EGD) WITH PROPOFOL N/A 04/24/2019   Procedure: ESOPHAGOGASTRODUODENOSCOPY (EGD) WITH PROPOFOL;  Surgeon: Rush Landmark Telford Nab., MD;  Location: [facility];  Service: Gastroenterology;  Laterality: N/A;  . HEMOSTASIS CLIP PLACEMENT  04/24/2019   Procedure: HEMOSTASIS CLIP PLACEMENT;  Surgeon: Irving Copas., MD;  Location: [facility];  Service: Gastroenterology;;  . KNEE SURGERY    . LOOP RECORDER INSERTION N/A 02/19/2018  Procedure: LOOP RECORDER INSERTION;  Surgeon: Deboraha Sprang, MD;  Location: [facility];  Service: Cardiovascular;  Laterality: N/A;  . POLYPECTOMY  04/24/2019   Procedure: POLYPECTOMY;  Surgeon: Rush Landmark Telford Nab., MD;  Location: [facility];  Service: Gastroenterology;;  . Lia Foyer LIFTING INJECTION  04/24/2019   Procedure: SUBMUCOSAL LIFTING INJECTION;  Surgeon: Irving Copas., MD;  Location: [facility];  Service: Gastroenterology;;  . TEE WITHOUT CARDIOVERSION N/A 07/06/2015   Procedure: TRANSESOPHAGEAL ECHOCARDIOGRAM (TEE);  Surgeon: Ivin Poot, MD;  Location: [facility];  Service: Open Heart Surgery;  Laterality: N/A;    There were no vitals filed for this visit.   Subjective Assessment - 04/30/19 1404    Subjective  Relays back pain for about 2 years that is making his legs weak and now cant stand or walk more than 2 minutes. He only uses wheelchair for community locomotion, uses RW/rollator at home but can only walk or stand for about 2 minutes max. Relays dizziness and PCP thinks this may be vertigo however he cant state what makes him dizzy and that he is dizzy all the time. He relays pain is moslty in his low back, ribs, Rt hip and down his Rt leg to his foot. Had Lt and Rt RFA to avoid surgical treatment due to cardiac risk.    Pertinent History  PMH: CVA X5, DM, CKD, CAD, CABGX4,    Limitations  Lifting;Standing;Walking;House hold activities    How long can you stand comfortably?  2 min    How long can you walk comfortably?  household distances    Diagnostic tests  Imaging shows "Spinal stenosis of lumbar region with neurogenic claudication, advanced lumbar DDD, Scoliosis"    Patient Stated Goals  stand and walk better, improve leg strength and pain    Currently in Pain?  Yes    Pain Score  4     Pain Location  Back    Pain Orientation  Lower    Pain Descriptors / Indicators  Aching;Burning    Pain Type  Chronic pain    Pain Radiating Towards  down Rt leg to his foot    Aggravating Factors   standing    Pain Relieving Factors  lying on his back or side    Effect of Pain on Daily Activities  unable to do standing ADLs         [facility] PT Assessment - 04/30/19 0001      Assessment   Medical Diagnosis  Spinal stenosis of lumbar region with neurogenic claudication, advanced lumbar DDD, Scoliosis, Rt leg weakness.    Referring Provider (PT)  [name], MD    Onset  Date/Surgical Date  --   2 year onset of pain   Next MD Visit  05/09/19    Prior Therapy  PT after CVA and neurorehab      Precautions   Precautions  Fall    Precaution Comments  no lifting more than 10 lbs      Balance Screen   Has the patient fallen in the past 6 months  Yes    How many times?  3   fell due to dizziness from new meds, then came off of them     [place] residence    Additional Comments  has ramp to enter      Prior Function   Level of Independence  Needs assistance with ADLs  Cognition   Overall Cognitive Status  Within Functional Limits for tasks assessed      Observation/Other Assessments   Scoliosis  moderate throacolumbar scoliosis      Sensation   Light Touch  Appears Intact      Posture/Postural Control   Posture Comments  slumped posture      ROM / Strength   AROM / PROM / Strength  AROM;Strength      AROM   AROM Assessment Site  Lumbar    Lumbar Flexion  WFL but needs to UE support for balance    Lumbar Extension  to neutral only    Lumbar - Right Side Bend  50%    Lumbar - Left Side Bend  50%    Lumbar - Right Rotation  50%    Lumbar - Left Rotation  50%      Strength   Strength Assessment Site  Knee;Hip;Ankle    Right/Left Hip  Right;Left    Right Hip Flexion  2+/5    Right Hip ABduction  3+/5    Left Hip Flexion  3+/5    Left Hip ABduction  4/5    Right/Left Knee  Right;Left    Right Knee Flexion  4/5    Right Knee Extension  3/5    Left Knee Flexion  4+/5    Left Knee Extension  4/5    Right/Left Ankle  Right;Left    Right Ankle Dorsiflexion  3/5    Right Ankle Plantar Flexion  4/5    Left Ankle Dorsiflexion  4/5    Left Ankle Plantar Flexion  4+/5      Flexibility   Soft Tissue Assessment /Muscle Length  --   tight hip flexors, hamstrings bilat     Palpation   Palpation comment  TTP lower lumbar and sacral segments with hypomobility, paraspinals, glutes      Special Tests    Other special tests  +slump test bilat, +SLR test bilat      Transfers   Transfers  Sit to Stand;Stand Pivot Transfers    Sit to Stand  5: Supervision;With upper extremity assist;With armrests;From chair/3-in-1    Five time sit to stand comments   37 seconds from wheelchair, needs to push up with both UE's    Stand Pivot Transfers  4: Min guard;With armrests   uses mat table to reach for for balance     Ambulation/Gait   Ambulation/Gait  Yes    Ambulation Distance (Feet)  30 Feet    Assistive device  Rollator    Gait Pattern  Step-through pattern;Decreased step length - right;Decreased step length - left;Decreased hip/knee flexion - right;Decreased hip/knee flexion - left;Decreased dorsiflexion - right;Decreased dorsiflexion - left;Trendelenburg    Ambulation Surface  Level;Indoor    Gait velocity  slow                Objective measurements completed on examination: See above findings.      [facility] Adult PT Treatment/Exercise - 04/30/19 0001      Modalities   Modalities  Cryotherapy      Cryotherapy   Number Minutes Cryotherapy  10 Minutes   with HEP creation and review   Cryotherapy Location  Lumbar Spine    Type of Cryotherapy  Ice pack             PT Education - 04/30/19 1531    Education Details  HEP, POC    Person(s) Educated  Patient    Methods  Explanation;Demonstration;Verbal cues;Handout    Comprehension  Verbalized understanding;Returned demonstration          PT Long Term Goals - 04/30/19 1554      PT LONG TERM GOAL #1   Title  independent with updated HEP (Target all LTGs 06/11/2019)    Time  6    Period  Weeks    Status  New      PT LONG TERM GOAL #2   Title  Improve lumbar ROM to [facility] in standing (at least 60%).    Time  6    Period  Weeks    Status  New      PT LONG TERM GOAL #3   Title  Improve bilat leg strength to at least 4+/5 MMT measured in sitting to improve function    Time  6    Period  Weeks    Status  New      PT  LONG TERM GOAL #4   Title  Will improve 5TSTS to <25 sec to show improved leg strength and endurance    Baseline  37 sec    Time  6    Period  Weeks    Status  New      PT LONG TERM GOAL #5   Title  Pt will be albe to stand and ambulate at least 10-15 minutes or 300 ft with LRAD to show improved ability for limited community ambulation    Time  6    Period  Weeks    Status  New             Plan - 04/30/19 1532    Clinical Impression Statement  Pt presents with Spinal stenosis of lumbar region with neurogenic claudication with significant bilat leg weakness Rt worse than Lt, advanced lumbar DDD and Scoliosis. He arrives in wheelchair and is only able to stand for 2 minutes and ambulate only short household distances with RW/rollator. He has overall decreased balance, decreased leg strength, decreased lumbar ROM, and increased pain limiting his function. He will benefit from skilled PT to address these deficits.    Personal Factors and Comorbidities  Comorbidity 3+    Comorbidities  PMH: CVA X5, DM, CKD, CAD, CABGX4,    Examination-Activity Limitations  Bed Mobility;Bend;Carry;Squat;Stairs;Lift;Stand;Transfers;Locomotion Level    Examination-Participation Restrictions  Meal Prep;Cleaning;Community Activity;Shop    Stability/Clinical Decision Making  Evolving/Moderate complexity    Clinical Decision Making  Moderate    Rehab Potential  Fair    PT Frequency  2x / week   1-2   PT Duration  6 weeks    PT Treatment/Interventions  ADLs/Self Care Home Management;Cryotherapy;Iontophoresis 4mg /ml Dexamethasone;Moist Heat;Gait training;Stair training;Functional mobility training;Therapeutic activities;Therapeutic exercise;Balance training;Neuromuscular re-education;Manual techniques;Passive range of motion;Taping    PT Next Visit Plan  review and update HEP PRN. needs bilat leg strength Rt is weaker than Lt, needs balance, gait. lumbar stretching    PT Home Exercise Plan  Access Code: [email or web address]     Consulted and Agree with Plan of Care  Patient       Patient will benefit from skilled therapeutic intervention in order to improve the following deficits and impairments:  Abnormal gait, Decreased activity tolerance, Decreased balance, Decreased endurance, Decreased range of motion, Decreased strength, Hypomobility, Difficulty walking, Impaired flexibility, Postural dysfunction, Pain  Visit Diagnosis: Chronic bilateral low back pain with right-sided sciatica  Other abnormalities of gait and mobility  Muscle weakness (generalized)  Other symptoms and signs involving the nervous system  Problem List Patient Active Problem List   Diagnosis Date Noted  . Chronic bilateral low back pain without sciatica 02/07/2019  . Statin intolerance 11/16/2018  . 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 Hyperlipidemia, mixed 08/15/2018  . Low back pain 07/18/2018  . Acute blood loss anemia 06/24/2018  . Upper GI bleed 06/24/2018  . History of lacunar cerebrovascular accident (CVA) 02/12/2018  . Dizziness 02/12/2018  . Myofascial pain 12/18/2017  . Spondylosis without myelopathy or radiculopathy, lumbar region 12/18/2017  . Lumbar radiculopathy, right 12/18/2017  . Cholelithiasis 05/17/2017  . Sinus Bradycardia 05/17/2017  . Thoracic radiculopathy 04/12/2017  . Primary osteoarthritis of right knee 12/14/2016  . DDD (degenerative disc disease), lumbar 10/10/2016  . Lumbar facet arthropathy 08/29/2016  . Idiopathic scoliosis 03/22/2016  . Diabetic neuropathy ([place]) 03/22/2016  . Dysautonomia orthostatic hypotension syndrome ([place]) 02/25/2016  . CKD stage 3 due to type 2 diabetes mellitus ([place]) 02/19/2016  . Coronary artery disease involving coronary bypass graft of native heart without angina pectoris   . Diabetes mellitus type 2, diet-controlled ([place])   . 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 Medication management 08/11/2015  . 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 PVD (peripheral vascular disease)  ([place]) 01/01/2014  . Hyperlipidemia associated with type 2 diabetes mellitus ([place]) 04/11/2013  . Supine hypertension   . Vitamin D deficiency   . Vitamin B 12 deficiency   . Coronary atherosclerosis- s/p PCI to LAD in 2009 and PCI to RCA in 2011 02/27/2009  . Abdominal aortic aneurysm ([place]) 02/27/2009  . GERD 02/27/2009    [name], PT,DPT 04/30/2019, 4:01 PM  [facility] Physical [facility] [address] [place], [place], [address] Phone: [phone]   Fax:  [phone]  Name: [name] MRN: [id number] Date of Birth: 1933-10-06

## 2019-04-30 NOTE — Patient Instructions (Signed)
Access Code: [facility]  URL: [email or web address]  Date: 04/30/2019  Prepared by: Elsie Ra   Exercises  Supine Single Knee to Chest - 3 sets - 30 hold - 2x daily - 6x weekly  Seated Lumbar Flexion Stretch - 10-20 reps - 1 sets - 5 hold - 2x daily - 6x weekly  Slump Stretch - 10 reps - 1-2 sets - 3 hold - 2x daily - 6x weekly  Seated March - 10 reps - 2-3 sets - 2x daily - 6x weekly  Seated Heel Toe Raises - 10 reps - 3 sets - 2x daily - 6x weekly  Sit to Stand with Armchair - 10 reps - 1-2 sets - 2x daily - 6x weekly  Mini Squat with Counter Support - 10 reps - 1-2 sets - 2x daily - 6x weekly  Standing Hip Abduction with Counter Support - 10 reps - 1-2 sets - 2x daily - 6x weekly

## 2019-05-02 ENCOUNTER — Ambulatory Visit (INDEPENDENT_AMBULATORY_CARE_PROVIDER_SITE_OTHER): Payer: Medicare Other | Admitting: *Deleted

## 2019-05-02 DIAGNOSIS — Z8673 Personal history of transient ischemic attack (TIA), and cerebral infarction without residual deficits: Secondary | ICD-10-CM | POA: Diagnosis not present

## 2019-05-03 LAB — CUP PACEART REMOTE DEVICE CHECK
Date Time Interrogation Session: 20201230231944
Implantable Pulse Generator Implant Date: 20191021

## 2019-05-03 NOTE — Progress Notes (Signed)
ILR remote 

## 2019-05-08 ENCOUNTER — Encounter: Payer: Self-pay | Admitting: Physical Therapy

## 2019-05-08 ENCOUNTER — Other Ambulatory Visit: Payer: Self-pay

## 2019-05-08 ENCOUNTER — Ambulatory Visit (INDEPENDENT_AMBULATORY_CARE_PROVIDER_SITE_OTHER): Payer: Medicare Other | Admitting: Physical Therapy

## 2019-05-08 DIAGNOSIS — R29818 Other symptoms and signs involving the nervous system: Secondary | ICD-10-CM | POA: Diagnosis not present

## 2019-05-08 DIAGNOSIS — M6281 Muscle weakness (generalized): Secondary | ICD-10-CM

## 2019-05-08 DIAGNOSIS — R2689 Other abnormalities of gait and mobility: Secondary | ICD-10-CM

## 2019-05-08 DIAGNOSIS — G8929 Other chronic pain: Secondary | ICD-10-CM

## 2019-05-08 DIAGNOSIS — R42 Dizziness and giddiness: Secondary | ICD-10-CM

## 2019-05-08 DIAGNOSIS — M5441 Lumbago with sciatica, right side: Secondary | ICD-10-CM

## 2019-05-08 NOTE — Patient Instructions (Signed)
Access Code: [facility]  URL: [email or web address]  Date: 05/08/2019  Prepared by: Lyndee Hensen   Exercises Supine Single Knee to Chest - 3 sets - 30 hold - 2x daily - 6x weekly Seated Lumbar Flexion Stretch - 10-20 reps - 1 sets - 5 hold - 2x daily - 6x weekly Slump Stretch - 10 reps - 1-2 sets - 3 hold - 2x daily - 6x weekly Seated March - 10 reps - 2-3 sets - 2x daily - 6x weekly Seated Heel Toe Raises - 10 reps - 3 sets - 2x daily - 6x weekly Sit to Stand with Armchair - 10 reps - 1-2 sets - 2x daily - 6x weekly Mini Squat with Counter Support - 10 reps - 1-2 sets - 2x daily - 6x weekly Standing Hip Abduction with Counter Support - 10 reps - 1-2 sets - 2x daily - 6x weekly Seated Knee Extension AROM - 10 reps - 2 sets - 2x daily Supine Hip Adduction Isometric with Ball - 10 reps - 2 sets - 2x daily Supine Bridge - 10 reps - 2 sets - 2x daily Hooklying Clamshell with Resistance - 10 reps - 2 sets - 2x daily

## 2019-05-09 ENCOUNTER — Ambulatory Visit: Payer: Self-pay

## 2019-05-09 ENCOUNTER — Ambulatory Visit: Payer: Medicare Other | Admitting: Specialist

## 2019-05-09 ENCOUNTER — Encounter: Payer: Self-pay | Admitting: Specialist

## 2019-05-09 ENCOUNTER — Telehealth: Payer: Self-pay | Admitting: Gastroenterology

## 2019-05-09 VITALS — BP 153/97 | HR 86 | Ht 72.0 in | Wt 183.0 lb

## 2019-05-09 DIAGNOSIS — M5136 Other intervertebral disc degeneration, lumbar region: Secondary | ICD-10-CM

## 2019-05-09 DIAGNOSIS — M48062 Spinal stenosis, lumbar region with neurogenic claudication: Secondary | ICD-10-CM

## 2019-05-09 DIAGNOSIS — M4105 Infantile idiopathic scoliosis, thoracolumbar region: Secondary | ICD-10-CM

## 2019-05-09 NOTE — Telephone Encounter (Signed)
[name] called inquiring about path results.

## 2019-05-09 NOTE — Therapy (Signed)
[facility] Physical Therapy [address] [place], [place], [address] Phone: [phone]   Fax:  [phone]  Physical Therapy Treatment  Patient Details  Name: [name] MRN: [id number] Date of Birth: May 27, 1933 Referring Provider (PT): [name], MD   Encounter Date: 05/08/2019  PT End of Session - 05/08/19 1052    Visit Number  2    Number of Visits  12    Date for PT Re-Evaluation  06/11/19    Authorization Type  [employer] MCR    PT Start Time  1016    PT Stop Time  1102    PT Time Calculation (min)  46 min    Activity Tolerance  Patient tolerated treatment well    Behavior During Therapy  [facility] for tasks assessed/performed       Past Medical History:  Diagnosis Date  . 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 Colon polyps   . Coronary atherosclerosis of unspecified type of vessel, native or graft   . Diabetes ([place])   . Difficult intubation   . Esophageal reflux   . High cholesterol   . History of IBS 02/27/2009  . Hypertension    pt denies, he says he has a h/o hypotension. If BP up he adjusts the Florinef  . Orthostatic hypotension    "BP has been dropping alot when I stand up for the last month or so" (02/17/2016)  . Vitamin B 12 deficiency   . Vitamin D deficiency     Past Surgical History:  Procedure Laterality Date  . BIOPSY  06/25/2018   Procedure: BIOPSY;  Surgeon: Rush Landmark Telford Nab., MD;  Location: [place];  Service: Gastroenterology;;  . BIOPSY  04/24/2019   Procedure: BIOPSY;  Surgeon: Irving Copas., MD;  Location: [facility];  Service: Gastroenterology;;  . BLADDER SURGERY  1969   traumatic pelvic fractures, urethral and bladder repair  . CARDIAC CATHETERIZATION N/A 07/01/2015   Procedure: Left Heart Cath and Coronary Angiography;  Surgeon: Wellington Hampshire, MD;  Location: [place] [facility];  Service: Cardiovascular;  Laterality: N/A;  . COLON RESECTION N/A 05/17/2017   Procedure: DIAGNOSTIC LAPAROSCOPY,;   Surgeon: Leighton Ruff, MD;  [facility];  Service: General;  Laterality: N/A;  . COLONOSCOPY WITH PROPOFOL N/A 04/24/2019   Procedure: COLONOSCOPY WITH PROPOFOL;  Surgeon: Irving Copas., MD;  [facility];  Service: Gastroenterology;  Laterality: N/A;  . CORONARY ANGIOPLASTY WITH STENT PLACEMENT    . CORONARY ARTERY BYPASS GRAFT N/A 07/06/2015   Procedure: CORONARY ARTERY BYPASS GRAFTING (CABG)x 4   utilizing the left internal mammary artery and endoscopically harvested bilateral  sapheneous vein.;  Surgeon: Ivin Poot, MD;  Location: [place];  Service: Open Heart Surgery;  Laterality: N/A;  . ESOPHAGEAL DILATION  04/24/2019   Procedure: ESOPHAGEAL DILATION;  Surgeon: Rush Landmark Telford Nab., MD;  [facility];  Service: Gastroenterology;;  . ESOPHAGOGASTRODUODENOSCOPY (EGD) WITH PROPOFOL N/A 06/25/2018   Procedure: ESOPHAGOGASTRODUODENOSCOPY (EGD) WITH PROPOFOL;  Surgeon: Irving Copas., MD;  Location: [place];  Service: Gastroenterology;  Laterality: N/A;  . ESOPHAGOGASTRODUODENOSCOPY (EGD) WITH PROPOFOL N/A 04/24/2019   Procedure: ESOPHAGOGASTRODUODENOSCOPY (EGD) WITH PROPOFOL;  Surgeon: Rush Landmark Telford Nab., MD;  [facility];  Service: Gastroenterology;  Laterality: N/A;  . HEMOSTASIS CLIP PLACEMENT  04/24/2019   Procedure: HEMOSTASIS CLIP PLACEMENT;  Surgeon: Irving Copas., MD;  [facility];  Service: Gastroenterology;;  . KNEE SURGERY    . LOOP RECORDER INSERTION N/A 02/19/2018  Procedure: LOOP RECORDER INSERTION;  Surgeon: Deboraha Sprang, MD;  Location: [place] [facility];  Service: Cardiovascular;  Laterality: N/A;  . POLYPECTOMY  04/24/2019   Procedure: POLYPECTOMY;  Surgeon: Rush Landmark Telford Nab., MD;  Location: [facility];  Service: Gastroenterology;;  . Lia Foyer LIFTING INJECTION  04/24/2019   Procedure: SUBMUCOSAL LIFTING INJECTION;  Surgeon: Irving Copas., MD;  [facility];  Service: Gastroenterology;;  . TEE WITHOUT CARDIOVERSION N/A 07/06/2015   Procedure: TRANSESOPHAGEAL ECHOCARDIOGRAM (TEE);  Surgeon: Ivin Poot, MD;  Location: [place];  Service: Open Heart Surgery;  Laterality: N/A;    There were no vitals filed for this visit.  Subjective Assessment - 05/08/19 1051    Subjective  Pt states "my legs are just so weak" i cant stand very long.    Currently in Pain?  Yes    Pain Score  4     Pain Location  Back    Pain Orientation  Left;Right;Lower    Pain Descriptors / Indicators  Aching;Burning    Pain Type  Chronic pain    Pain Onset  More than a month ago    Pain Frequency  Intermittent                       [facility] Adult PT Treatment/Exercise - 05/09/19 0001      Transfers   Comments  sit to stand: x5 with practice for mehcanics and hand placement       Ambulation/Gait   Ambulation Distance (Feet)  50 Feet    Assistive device  Rollator    Gait velocity  slow    Gait Comments  with wheelchair follow       Exercises   Exercises  Knee/Hip;Lumbar      Lumbar Exercises: Stretches   Single Knee to Chest Stretch  2 reps;30 seconds;Right;Left    Lower Trunk Rotation  5 reps;10 seconds      Knee/Hip Exercises: Stretches   Active Hamstring Stretch  2 reps;30 seconds    Active Hamstring Stretch Limitations  seated       Knee/Hip Exercises: Standing   Heel Raises  10 reps    Other Standing Knee Exercises  Weight shift L/R x20 minimal UE support       Knee/Hip Exercises: Seated   Long Arc Quad  10 reps;2 sets;Both      Knee/Hip Exercises: Supine   Bridges  10 reps;2 sets    Straight Leg Raises Limitations  unable    Other Supine Knee/Hip Exercises  Hip Add Isometric 5 sec x20; Clams OrTB x20;     Other Supine Knee/Hip Exercises  Supine march x20;              PT Education - 05/08/19 1106    Education Details  HEP updated    Person(s) Educated  Patient    Methods  Explanation;Demonstration;Handout     Comprehension  Verbalized understanding;Tactile cues required;Returned demonstration;Need further instruction          PT Long Term Goals - 04/30/19 1554      PT LONG TERM GOAL #1   Title  independent with updated HEP (Target all LTGs 06/11/2019)    Time  6    Period  Weeks    Status  New      PT LONG TERM GOAL #2   Title  Improve lumbar ROM to [facility] in standing (at least 60%).    Time  6  Period  Weeks    Status  New      PT LONG TERM GOAL #3   Title  Improve bilat leg strength to at least 4+/5 MMT measured in sitting to improve function    Time  6    Period  Weeks    Status  New      PT LONG TERM GOAL #4   Title  Will improve 5TSTS to <25 sec to show improved leg strength and endurance    Baseline  37 sec    Time  6    Period  Weeks    Status  New      PT LONG TERM GOAL #5   Title  Pt will be albe to stand and ambulate at least 10-15 minutes or 300 ft with LRAD to show improved ability for limited community ambulation    Time  6    Period  Weeks    Status  New            Plan - 05/09/19 1127    Clinical Impression Statement  Pt with very low tolerance for standing activity, LEs visibly shakey after a couple minutes, due to weakness, and pt requests to sit down. Added LE and core strength on mat table today, updated HEP. Pt may also benefit from vesibular assessment, states he is dizzy "all the time".    Personal Factors and Comorbidities  Comorbidity 3+    Comorbidities  PMH: CVA X5, DM, CKD, CAD, CABGX4,    Examination-Activity Limitations  Bed Mobility;Bend;Carry;Squat;Stairs;Lift;Stand;Transfers;Locomotion Level    Examination-Participation Restrictions  Meal Prep;Cleaning;Community Activity;Shop    Stability/Clinical Decision Making  Evolving/Moderate complexity    Rehab Potential  Fair    PT Frequency  2x / week   1-2   PT Duration  6 weeks    PT Treatment/Interventions  ADLs/Self Care Home Management;Cryotherapy;Iontophoresis 4mg /ml Dexamethasone;Moist  Heat;Gait training;Stair training;Functional mobility training;Therapeutic activities;Therapeutic exercise;Balance training;Neuromuscular re-education;Manual techniques;Passive range of motion;Taping    PT Next Visit Plan  review and update HEP PRN. needs bilat leg strength Rt is weaker than Lt, needs balance, gait. lumbar stretching    PT Home Exercise Plan  Access Code: [email or web address]    Consulted and Agree with Plan of Care  Patient       Patient will benefit from skilled therapeutic intervention in order to improve the following deficits and impairments:  Abnormal gait, Decreased activity tolerance, Decreased balance, Decreased endurance, Decreased range of motion, Decreased strength, Hypomobility, Difficulty walking, Impaired flexibility, Postural dysfunction, Pain  Visit Diagnosis: Chronic bilateral low back pain with right-sided sciatica  Other abnormalities of gait and mobility  Muscle weakness (generalized)  Other symptoms and signs involving the nervous system  Dizziness and giddiness     Problem List Patient Active Problem List   Diagnosis Date Noted  . Chronic bilateral low back pain without sciatica 02/07/2019  . Statin intolerance 11/16/2018  . 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 Hyperlipidemia, mixed 08/15/2018  . Low back pain 07/18/2018  . Acute blood loss anemia 06/24/2018  . Upper GI bleed 06/24/2018  . History of lacunar cerebrovascular accident (CVA) 02/12/2018  . Dizziness 02/12/2018  . Myofascial pain 12/18/2017  . Spondylosis without myelopathy or radiculopathy, lumbar region 12/18/2017  . Lumbar radiculopathy, right 12/18/2017  . Cholelithiasis 05/17/2017  . Sinus Bradycardia 05/17/2017  . Thoracic radiculopathy 04/12/2017  . Primary osteoarthritis of right knee 12/14/2016  . DDD (degenerative disc disease), lumbar 10/10/2016  . Lumbar facet arthropathy 08/29/2016  .  Idiopathic scoliosis 03/22/2016  . Diabetic neuropathy ([place]) 03/22/2016  .  Dysautonomia orthostatic hypotension syndrome ([place]) 02/25/2016  . CKD stage 3 due to type 2 diabetes mellitus ([place]) 02/19/2016  . Coronary artery disease involving coronary bypass graft of native heart without angina pectoris   . Diabetes mellitus type 2, diet-controlled ([place])   . 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 Medication management 08/11/2015  . 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 PVD (peripheral vascular disease) ([place]) 01/01/2014  . Hyperlipidemia associated with type 2 diabetes mellitus ([place]) 04/11/2013  . Supine hypertension   . Vitamin D deficiency   . Vitamin B 12 deficiency   . Coronary atherosclerosis- s/p PCI to LAD in 2009 and PCI to RCA in 2011 02/27/2009  . Abdominal aortic aneurysm ([place]) 02/27/2009  . GERD 02/27/2009    Lyndee Hensen, PT, DPT 11:28 AM  05/09/19    [facility] Physical Therapy [address] [place], [place], [address] Phone: [phone]   Fax:  [phone]  Name: [name] MRN: [id number] Date of Birth: 28-Jun-1933

## 2019-05-09 NOTE — Progress Notes (Signed)
Office Visit Note   Patient: [name]           Date of Birth: [birth year]           MRN: [id number] Visit Date: 05/09/2019              Requested by: [name], [address] [place],  [place] [address] PCP: [name], MD   Assessment & Plan: Visit Diagnoses:  1. Spinal stenosis of lumbar region with neurogenic claudication   2. Infantile idiopathic scoliosis of thoracolumbar region   3. Degenerative disc disease, lumbar     Plan: Avoid bending, stooping and avoid lifting weights greater than 10 lbs. Avoid prolong standing and walking. Avoid frequent bending and stooping  No lifting greater than 10 lbs. May use ice or moist heat for pain. Weight loss is of benefit. Handicap license is approved. Physical Therapy for core strengthening and LE We are trying to avoid surgery due to increased risks of complication with the COVID and heart and artery concerns. Take blood pressure med due to elevation of your blood pressure today.  Follow-Up Instructions: No follow-ups on file.   Orders:  Orders Placed This Encounter  Procedures  . XR Lumbar Spine 2-3 Views   No orders of the defined types were placed in this encounter.     Procedures: No procedures performed   Clinical Data: No additional findings.   Subjective: Chief Complaint  Patient presents with  . Lower Back - Follow-up    86 year old male with history of back pain spinal stenosis symptoms and neurgenic claudication. No bowel or bladder discomfort or dysfunction. Can not walk for grocery shopping or even to go to the  Mail box and back. Has right leg weakness and numbness. In a wheel chair today.  He had right sided RFA of the L2-3, L3-4 and L4-5 levels. He is sore in his ribs both sides.    Review of Systems  Constitutional: Negative.  Negative for activity change, appetite change, chills, diaphoresis, fatigue, fever and unexpected weight change.  HENT:  Negative.  Negative for congestion, dental problem, drooling, ear discharge, ear pain, facial swelling, hearing loss, mouth sores, nosebleeds, postnasal drip, rhinorrhea, sinus pressure, sinus pain, sneezing, sore throat, tinnitus, trouble swallowing and voice change.   Eyes: Negative.   Respiratory: Negative.   Cardiovascular: Negative.   Gastrointestinal: Negative.   Endocrine: Negative.   Genitourinary: Negative.   Musculoskeletal: Positive for back pain and gait problem.  Skin: Negative.   Allergic/Immunologic: Negative.   Hematological: Negative.   Psychiatric/Behavioral: Negative.      Objective: Vital Signs: BP (!) 153/97 (BP Location: Left Arm, Patient Position: Sitting)   Pulse 86   Ht 6' (1.829 m)   Wt 183 lb (83 kg)   BMI 24.82 kg/m   Physical Exam Constitutional:      Appearance: He is well-developed.  HENT:     Head: Normocephalic and atraumatic.  Eyes:     Pupils: Pupils are equal, round, and reactive to light.  Pulmonary:     Effort: Pulmonary effort is normal.     Breath sounds: Normal breath sounds.  Abdominal:     General: Bowel sounds are normal.     Palpations: Abdomen is soft.  Musculoskeletal:     Cervical back: Normal range of motion and neck supple.     Lumbar back: Negative right straight leg raise test and negative left straight leg raise test.  Skin:  General: Skin is warm and dry.  Neurological:     Mental Status: He is alert and oriented to person, place, and time.  Psychiatric:        Behavior: Behavior normal.        Thought Content: Thought content normal.        Judgment: Judgment normal.     Back Exam   Tenderness  The patient is experiencing tenderness in the lumbar.  Range of Motion  Extension: abnormal  Flexion: normal  Lateral bend right: abnormal  Lateral bend left: abnormal  Rotation right: abnormal  Rotation left: abnormal   Muscle Strength  Right Quadriceps:  4/5  Left Quadriceps:  5/5  Right Hamstrings:   5/5  Left Hamstrings:  5/5   Tests  Straight leg raise right: negative Straight leg raise left: negative  Reflexes  Patellar: abnormal Achilles: abnormal Biceps: normal Babinski's sign: normal   Other  Toe walk: abnormal Heel walk: abnormal Erythema: no back redness Scars: absent  Comments:  Right hip weakness 5-/5, right knee weakness 4+/5 and most significant is right foot DF weakness 4/5.      Specialty Comments:  No specialty comments available.  Imaging: No results found.   PMFS History: Patient Active Problem List   Diagnosis Date Noted  . Chronic bilateral low back pain without sciatica 02/07/2019  . Statin intolerance 11/16/2018  . 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 Hyperlipidemia, mixed 08/15/2018  . Low back pain 07/18/2018  . Acute blood loss anemia 06/24/2018  . Upper GI bleed 06/24/2018  . History of lacunar cerebrovascular accident (CVA) 02/12/2018  . Dizziness 02/12/2018  . Myofascial pain 12/18/2017  . Spondylosis without myelopathy or radiculopathy, lumbar region 12/18/2017  . Lumbar radiculopathy, right 12/18/2017  . Cholelithiasis 05/17/2017  . Sinus Bradycardia 05/17/2017  . Thoracic radiculopathy 04/12/2017  . Primary osteoarthritis of right knee 12/14/2016  . DDD (degenerative disc disease), lumbar 10/10/2016  . Lumbar facet arthropathy 08/29/2016  . Idiopathic scoliosis 03/22/2016  . Diabetic neuropathy ([place]) 03/22/2016  . Dysautonomia orthostatic hypotension syndrome ([place]) 02/25/2016  . CKD stage 3 due to type 2 diabetes mellitus ([place]) 02/19/2016  . Coronary artery disease involving coronary bypass graft of native heart without angina pectoris   . Diabetes mellitus type 2, diet-controlled ([place])   . 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 Medication management 08/11/2015  . 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 PVD (peripheral vascular disease) ([place]) 01/01/2014  . Hyperlipidemia associated with type 2 diabetes mellitus ([place]) 04/11/2013  .  Supine hypertension   . Vitamin D deficiency   . Vitamin B 12 deficiency   . Coronary atherosclerosis- s/p PCI to LAD in 2009 and PCI to RCA in 2011 02/27/2009  . Abdominal aortic aneurysm ([place]) 02/27/2009  . GERD 02/27/2009   Past Medical History:  Diagnosis Date  . 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 Colon polyps   . Coronary atherosclerosis of unspecified type of vessel, native or graft   . Diabetes ([place])   . Difficult intubation   . Esophageal reflux   . High cholesterol   . History of IBS 02/27/2009  . Hypertension    pt denies, he says he has a h/o hypotension. If BP up he adjusts the Florinef  . Orthostatic hypotension    "BP has been dropping alot when I stand up for the last month or so" (02/17/2016)  . Vitamin B 12 deficiency   . Vitamin D deficiency     Family History  Problem Relation Age of Onset  . Heart attack Father        died age 44  . Heart attack Brother        died age 29  . Anuerysm Brother        aortic  . Heart attack Sister        died age 60  . Colon cancer Sister   . Liver cancer Sister   . Diabetes Maternal Grandmother   . Arthritis Mother   . Dementia Mother   . Heart Problems Brother   . Heart Problems Brother   . Heart Problems Brother   . Heart Problems Brother   . Healthy Daughter   . Healthy Son   . Esophageal cancer Neg Hx   . Inflammatory bowel disease Neg Hx   . Pancreatic cancer Neg Hx   . Stomach cancer Neg Hx     Past Surgical History:  Procedure Laterality Date  . BIOPSY  06/25/2018   Procedure: BIOPSY;  Surgeon: Rush Landmark Telford Nab., MD;  Location: [place];  Service: Gastroenterology;;  . BIOPSY  04/24/2019   Procedure: BIOPSY;  Surgeon: Irving Copas., MD;  Location: [facility];  Service: Gastroenterology;;  . BLADDER SURGERY  1969   traumatic pelvic fractures, urethral and bladder repair  . CARDIAC CATHETERIZATION N/A 07/01/2015   Procedure: Left Heart Cath and Coronary Angiography;  Surgeon:  Wellington Hampshire, MD;  Location: [place] [facility];  Service: Cardiovascular;  Laterality: N/A;  . COLON RESECTION N/A 05/17/2017   Procedure: DIAGNOSTIC LAPAROSCOPY,;  Surgeon: Leighton Ruff, MD;  [facility];  Service: General;  Laterality: N/A;  . COLONOSCOPY WITH PROPOFOL N/A 04/24/2019   Procedure: COLONOSCOPY WITH PROPOFOL;  Surgeon: Irving Copas., MD;  [facility];  Service: Gastroenterology;  Laterality: N/A;  . CORONARY ANGIOPLASTY WITH STENT PLACEMENT    . CORONARY ARTERY BYPASS GRAFT N/A 07/06/2015   Procedure: CORONARY ARTERY BYPASS GRAFTING (CABG)x 4   utilizing the left internal mammary artery and endoscopically harvested bilateral  sapheneous vein.;  Surgeon: Ivin Poot, MD;  Location: [place];  Service: Open Heart Surgery;  Laterality: N/A;  . ESOPHAGEAL DILATION  04/24/2019   Procedure: ESOPHAGEAL DILATION;  Surgeon: Rush Landmark Telford Nab., MD;  [facility];  Service: Gastroenterology;;  . ESOPHAGOGASTRODUODENOSCOPY (EGD) WITH PROPOFOL N/A 06/25/2018   Procedure: ESOPHAGOGASTRODUODENOSCOPY (EGD) WITH PROPOFOL;  Surgeon: Irving Copas., MD;  Location: [place];  Service: Gastroenterology;  Laterality: N/A;  . ESOPHAGOGASTRODUODENOSCOPY (EGD) WITH PROPOFOL N/A 04/24/2019   Procedure: ESOPHAGOGASTRODUODENOSCOPY (EGD) WITH PROPOFOL;  Surgeon: Rush Landmark Telford Nab., MD;  [facility];  Service: Gastroenterology;  Laterality: N/A;  . HEMOSTASIS CLIP PLACEMENT  04/24/2019   Procedure: HEMOSTASIS CLIP PLACEMENT;  Surgeon: Irving Copas., MD;  [facility];  Service: Gastroenterology;;  . KNEE SURGERY    . LOOP RECORDER INSERTION N/A 02/19/2018   Procedure: LOOP RECORDER INSERTION;  Surgeon: Deboraha Sprang, MD;  Location: [place] [facility];  Service: Cardiovascular;  Laterality: N/A;  . POLYPECTOMY  04/24/2019   Procedure: POLYPECTOMY;  Surgeon: Rush Landmark Telford Nab., MD;  Location: [facility];   Service: Gastroenterology;;  . Lia Foyer LIFTING INJECTION  04/24/2019   Procedure: SUBMUCOSAL LIFTING INJECTION;  Surgeon: Irving Copas., MD;  [facility];  Service: Gastroenterology;;  . TEE WITHOUT CARDIOVERSION N/A 07/06/2015   Procedure: TRANSESOPHAGEAL ECHOCARDIOGRAM (TEE);  Surgeon: Ivin Poot, MD;  Location: [place];  Service: Open Heart Surgery;  Laterality: N/A;  Social History   Occupational History  . Occupation: Retired  Tobacco Use  . Smoking status: Former Smoker    Quit date: 07/16/1963    Years since quitting: 55.8  . Smokeless tobacco: Former Systems developer    Types: Chew    Quit date: 1989  Substance and Sexual Activity  . Alcohol use: No  . Drug use: No  . Sexual activity: Not Currently

## 2019-05-09 NOTE — Patient Instructions (Signed)
Avoid bending, stooping and avoid lifting weights greater than 10 lbs. Avoid prolong standing and walking. Avoid frequent bending and stooping  No lifting greater than 10 lbs. May use ice or moist heat for pain. Weight loss is of benefit. Handicap license is approved. Physical Therapy for core strengthening and LE We are trying to avoid surgery due to increased risks of complication with the COVID and heart and artery concerns. Take blood pressure med due to elevation of your blood pressure today.

## 2019-05-10 NOTE — Telephone Encounter (Signed)
  The pt has been given the information in the following letter and also advised that we did mail a copy and it is available on My Chart.  He is aware to call if he has any problems prior to the 5 year colon recall. The pt has been advised of the information and verbalized understanding.      Nayshawn Bumbalough Joa [address] [place] [address]  Dear Mr. Saldierna,  The pathology from your recent colonoscopy has returned.  The larger polyp like area that we removed and placed clips on, returned as normal, benign, not-precancerous tissue.  A few of the other small polyps that we removed returned as tubular adenomas.  Tubular adenomas are precancerous polyps meaning that they had the potential to change into cancer over time had they not been removed.  Normally, I recommend you have a repeat colonoscopy in 5 years to determine if you have developed any new precancerous polyps and to screen for colorectal cancer, however, you will be 86 years old, and unless your health is excellent and we feel that you have a life expectancy of at least 10 more years, we may consider ending colonoscopies at this time.  We are happy to reevaluate when it comes time but would need to see you in clinic and discuss with your primary care provider as well.  If you develop any new rectal bleeding, abdominal pain or significant bowel habit changes, please contact us before then at Dept: [phone].    Sincerely,  Irving Copas., MD

## 2019-05-13 ENCOUNTER — Ambulatory Visit (INDEPENDENT_AMBULATORY_CARE_PROVIDER_SITE_OTHER): Payer: Medicare Other | Admitting: Physical Therapy

## 2019-05-13 ENCOUNTER — Other Ambulatory Visit: Payer: Self-pay

## 2019-05-13 ENCOUNTER — Ambulatory Visit: Payer: Medicare Other | Admitting: Physical Therapy

## 2019-05-13 DIAGNOSIS — M6281 Muscle weakness (generalized): Secondary | ICD-10-CM | POA: Diagnosis not present

## 2019-05-13 DIAGNOSIS — M5441 Lumbago with sciatica, right side: Secondary | ICD-10-CM | POA: Diagnosis not present

## 2019-05-13 DIAGNOSIS — R29818 Other symptoms and signs involving the nervous system: Secondary | ICD-10-CM

## 2019-05-13 DIAGNOSIS — R2689 Other abnormalities of gait and mobility: Secondary | ICD-10-CM | POA: Diagnosis not present

## 2019-05-13 DIAGNOSIS — R42 Dizziness and giddiness: Secondary | ICD-10-CM

## 2019-05-13 DIAGNOSIS — G8929 Other chronic pain: Secondary | ICD-10-CM

## 2019-05-13 NOTE — Therapy (Signed)
[facility] [address] [place], [place], [address] Phone: [phone]   Fax:  [phone]  Physical Therapy Treatment  Patient Details  Name: [name] MRN: [id number] Date of Birth: December 19, 1933 Referring Provider (PT): [name], MD   Encounter Date: 05/13/2019  PT End of Session - 05/13/19 1507    Visit Number  3    Number of Visits  12    Date for PT Re-Evaluation  06/11/19    Authorization Type  [employer] MCR    PT Start Time  [id number]    PT Stop Time  1230    PT Time Calculation (min)  45 min    Activity Tolerance  Patient tolerated treatment well    Behavior During Therapy  [facility] for tasks assessed/performed       Past Medical History:  Diagnosis Date  . 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 Colon polyps   . Coronary atherosclerosis of unspecified type of vessel, native or graft   . Diabetes ([place])   . Difficult intubation   . Esophageal reflux   . High cholesterol   . History of IBS 02/27/2009  . Hypertension    pt denies, he says he has a h/o hypotension. If BP up he adjusts the Florinef  . Orthostatic hypotension    "BP has been dropping alot when I stand up for the last month or so" (02/17/2016)  . Vitamin B 12 deficiency   . Vitamin D deficiency     Past Surgical History:  Procedure Laterality Date  . BIOPSY  06/25/2018   Procedure: BIOPSY;  Surgeon: Rush Landmark Telford Nab., MD;  Location: [place];  Service: Gastroenterology;;  . BIOPSY  04/24/2019   Procedure: BIOPSY;  Surgeon: Irving Copas., MD;  Location: [facility];  Service: Gastroenterology;;  . BLADDER SURGERY  1969   traumatic pelvic fractures, urethral and bladder repair  . CARDIAC CATHETERIZATION N/A 07/01/2015   Procedure: Left Heart Cath and Coronary Angiography;  Surgeon: Wellington Hampshire, MD;  Location: [place] [facility];  Service: Cardiovascular;  Laterality: N/A;  . COLON RESECTION N/A 05/17/2017   Procedure: DIAGNOSTIC LAPAROSCOPY,;   Surgeon: Leighton Ruff, MD;  Location: [facility];  Service: General;  Laterality: N/A;  . COLONOSCOPY WITH PROPOFOL N/A 04/24/2019   Procedure: COLONOSCOPY WITH PROPOFOL;  Surgeon: Irving Copas., MD;  Location: [facility];  Service: Gastroenterology;  Laterality: N/A;  . CORONARY ANGIOPLASTY WITH STENT PLACEMENT    . CORONARY ARTERY BYPASS GRAFT N/A 07/06/2015   Procedure: CORONARY ARTERY BYPASS GRAFTING (CABG)x 4   utilizing the left internal mammary artery and endoscopically harvested bilateral  sapheneous vein.;  Surgeon: Ivin Poot, MD;  Location: [place];  Service: Open Heart Surgery;  Laterality: N/A;  . ESOPHAGEAL DILATION  04/24/2019   Procedure: ESOPHAGEAL DILATION;  Surgeon: Rush Landmark Telford Nab., MD;  Location: [facility];  Service: Gastroenterology;;  . ESOPHAGOGASTRODUODENOSCOPY (EGD) WITH PROPOFOL N/A 06/25/2018   Procedure: ESOPHAGOGASTRODUODENOSCOPY (EGD) WITH PROPOFOL;  Surgeon: Irving Copas., MD;  Location: [place];  Service: Gastroenterology;  Laterality: N/A;  . ESOPHAGOGASTRODUODENOSCOPY (EGD) WITH PROPOFOL N/A 04/24/2019   Procedure: ESOPHAGOGASTRODUODENOSCOPY (EGD) WITH PROPOFOL;  Surgeon: Rush Landmark Telford Nab., MD;  Location: [facility];  Service: Gastroenterology;  Laterality: N/A;  . HEMOSTASIS CLIP PLACEMENT  04/24/2019   Procedure: HEMOSTASIS CLIP PLACEMENT;  Surgeon: Irving Copas., MD;  Location: [facility];  Service: Gastroenterology;;  . KNEE SURGERY    . LOOP RECORDER INSERTION N/A 02/19/2018  Procedure: LOOP RECORDER INSERTION;  Surgeon: Deboraha Sprang, MD;  Location: [place] [facility];  Service: Cardiovascular;  Laterality: N/A;  . POLYPECTOMY  04/24/2019   Procedure: POLYPECTOMY;  Surgeon: Rush Landmark Telford Nab., MD;  Location: [facility];  Service: Gastroenterology;;  . Lia Foyer LIFTING INJECTION  04/24/2019   Procedure: SUBMUCOSAL LIFTING INJECTION;  Surgeon: Irving Copas., MD;  [facility];  Service: Gastroenterology;;  . TEE WITHOUT CARDIOVERSION N/A 07/06/2015   Procedure: TRANSESOPHAGEAL ECHOCARDIOGRAM (TEE);  Surgeon: Ivin Poot, MD;  Location: [place];  Service: Open Heart Surgery;  Laterality: N/A;    There were no vitals filed for this visit.  Subjective Assessment - 05/13/19 1502    Subjective  Pt relays compliance with HEP. He states he is sore and has 3/10 pain after having a fall on saturday due to dizziness. He denies any injury from this fall and that he landed on his wife and did not hit his head.    Pertinent History  PMH: CVA X5, DM, CKD, CAD, CABGX4,    Limitations  Lifting;Standing;Walking;House hold activities    How long can you stand comfortably?  2 min    How long can you walk comfortably?  household distances    Diagnostic tests  Imaging shows "Spinal stenosis of lumbar region with neurogenic claudication, advanced lumbar DDD, Scoliosis"    Patient Stated Goals  stand and walk better, improve leg strength and pain    Pain Onset  More than a month ago                       [facility] Adult PT Treatment/Exercise - 05/13/19 0001      Transfers   Comments  sit to stand: x5 with practice for mehcanics and hand placement       Ambulation/Gait   Ambulation Distance (Feet)  75 Feet   X2   Assistive device  Rollator    Gait velocity  slow    Gait Comments  with wheelchair follow       Lumbar Exercises: Aerobic   Nustep  5 min L5 for LE strength      Knee/Hip Exercises: Standing   Other Standing Knee Exercises  in bars with bilat UE support for march walking, latreal walking up/down X 2, then after seated rest break performed step ups on 4 inch step with bila UE support 2X 5 bilat      Knee/Hip Exercises: Seated   Long Arc Quad  Both;15 reps    Long Arc Quad Weight  2 lbs.    Hamstring Curl  Both;15 reps    Hamstring Limitations  L2 band                  PT Long Term Goals - 04/30/19 1554      PT LONG TERM GOAL  #1   Title  independent with updated HEP (Target all LTGs 06/11/2019)    Time  6    Period  Weeks    Status  New      PT LONG TERM GOAL #2   Title  Improve lumbar ROM to [facility] in standing (at least 60%).    Time  6    Period  Weeks    Status  New      PT LONG TERM GOAL #3   Title  Improve bilat leg strength to at least 4+/5 MMT measured in sitting to improve function    Time  6  Period  Weeks    Status  New      PT LONG TERM GOAL #4   Title  Will improve 5TSTS to <25 sec to show improved leg strength and endurance    Baseline  37 sec    Time  6    Period  Weeks    Status  New      PT LONG TERM GOAL #5   Title  Pt will be albe to stand and ambulate at least 10-15 minutes or 300 ft with LRAD to show improved ability for limited community ambulation    Time  6    Period  Weeks    Status  New            Plan - 05/13/19 1507    Clinical Impression Statement  Able to progress tolerance for standing leg strengthening activity today as well as ambulation distance. Overall he shows good effort with PT and will continue to benefit from strength and and conditioning. His dizziness sounds more like orthostatic hypotension and he says he was known to have this in the past.    Personal Factors and Comorbidities  Comorbidity 3+    Comorbidities  PMH: CVA X5, DM, CKD, CAD, CABGX4,    Examination-Activity Limitations  Bed Mobility;Bend;Carry;Squat;Stairs;Lift;Stand;Transfers;Locomotion Level    Examination-Participation Restrictions  Meal Prep;Cleaning;Community Activity;Shop    Stability/Clinical Decision Making  Evolving/Moderate complexity    Rehab Potential  Fair    PT Frequency  2x / week   1-2   PT Duration  6 weeks    PT Treatment/Interventions  ADLs/Self Care Home Management;Cryotherapy;Iontophoresis 4mg /ml Dexamethasone;Moist Heat;Gait training;Stair training;Functional mobility training;Therapeutic activities;Therapeutic exercise;Balance training;Neuromuscular  re-education;Manual techniques;Passive range of motion;Taping    PT Next Visit Plan  review and update HEP PRN. needs bilat leg strength Rt is weaker than Lt, needs balance, gait. lumbar stretching    PT Home Exercise Plan  Access Code: [email or web address]    Consulted and Agree with Plan of Care  Patient       Patient will benefit from skilled therapeutic intervention in order to improve the following deficits and impairments:  Abnormal gait, Decreased activity tolerance, Decreased balance, Decreased endurance, Decreased range of motion, Decreased strength, Hypomobility, Difficulty walking, Impaired flexibility, Postural dysfunction, Pain  Visit Diagnosis: Chronic bilateral low back pain with right-sided sciatica  Other abnormalities of gait and mobility  Muscle weakness (generalized)  Other symptoms and signs involving the nervous system  Dizziness and giddiness     Problem List Patient Active Problem List   Diagnosis Date Noted  . Chronic bilateral low back pain without sciatica 02/07/2019  . Statin intolerance 11/16/2018  . 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 Hyperlipidemia, mixed 08/15/2018  . Low back pain 07/18/2018  . Acute blood loss anemia 06/24/2018  . Upper GI bleed 06/24/2018  . History of lacunar cerebrovascular accident (CVA) 02/12/2018  . Dizziness 02/12/2018  . Myofascial pain 12/18/2017  . Spondylosis without myelopathy or radiculopathy, lumbar region 12/18/2017  . Lumbar radiculopathy, right 12/18/2017  . Cholelithiasis 05/17/2017  . Sinus Bradycardia 05/17/2017  . Thoracic radiculopathy 04/12/2017  . Primary osteoarthritis of right knee 12/14/2016  . DDD (degenerative disc disease), lumbar 10/10/2016  . Lumbar facet arthropathy 08/29/2016  . Idiopathic scoliosis 03/22/2016  . Diabetic neuropathy ([place]) 03/22/2016  . Dysautonomia orthostatic hypotension syndrome ([place]) 02/25/2016  . CKD stage 3 due to type 2 diabetes mellitus ([place]) 02/19/2016  . Coronary artery  disease involving coronary bypass graft of native heart without angina  pectoris   . Diabetes mellitus type 2, diet-controlled ([place])   . 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 Medication management 08/11/2015  . 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 PVD (peripheral vascular disease) ([place]) 01/01/2014  . Hyperlipidemia associated with type 2 diabetes mellitus ([place]) 04/11/2013  . Supine hypertension   . Vitamin D deficiency   . Vitamin B 12 deficiency   . Coronary atherosclerosis- s/p PCI to LAD in 2009 and PCI to RCA in 2011 02/27/2009  . Abdominal aortic aneurysm ([place]) 02/27/2009  . GERD 02/27/2009    Silvestre Mesi 05/13/2019, 3:09 PM  [facility] [address] [place], [place], [address] Phone: [phone]   Fax:  [phone]  Name: [name] MRN: [id number] Date of Birth: 1933-09-01

## 2019-05-15 ENCOUNTER — Ambulatory Visit: Payer: Medicare Other | Admitting: Adult Health

## 2019-05-15 ENCOUNTER — Other Ambulatory Visit: Payer: Self-pay

## 2019-05-15 ENCOUNTER — Encounter: Payer: Self-pay | Admitting: Adult Health

## 2019-05-15 VITALS — BP 140/92 | HR 78 | Temp 97.3°F | Ht 72.0 in | Wt 190.0 lb

## 2019-05-15 DIAGNOSIS — R300 Dysuria: Secondary | ICD-10-CM | POA: Diagnosis not present

## 2019-05-15 DIAGNOSIS — N410 Acute prostatitis: Secondary | ICD-10-CM

## 2019-05-15 MED ORDER — SULFAMETHOXAZOLE-TRIMETHOPRIM 800-160 MG PO TABS: 1.0000 | ORAL_TABLET | Freq: Two times a day (BID) | ORAL | 0 refills | Status: DC

## 2019-05-15 NOTE — Progress Notes (Signed)
Assessment and Plan:  [name] was seen today for urinary tract infection and dizziness.  Diagnoses and all orders for this visit:  Acute prostatitis/Dysuria UTI vs prostatitis, with hx of complicated prostatitis and tenderness without bogginess on DRE today with proceed with 4 week course treatment to cover  Push fluids; monitor symptoms closely Contact office immediately or present to ED if urine output is decreased, with fever/chills, confusion, generalized worsening of symptoms Contact office if not improving in 24-48 hours Pending urine culture Keep close follow up as scheduled, will recheck labs with CRP at that time  -     CBC with Diff -     BMP with Estimated GFR [id number]) -     Urinalysis w microscopic + reflex cultur -     sulfamethoxazole-trimethoprim (BACTRIM DS) 800-160 MG tablet; Take 1 tablet by mouth 2 (two) times daily.  Further disposition pending results of labs. Discussed med's effects and SE's.   Over 30 minutes of exam, counseling, chart review, and critical decision making was performed.   Future Appointments  Date Time Provider [place]  05/15/2019 10:30 AM Liane Comber, NP [facility] None  05/20/2019 11:45 AM [name], PT [facility] None  05/22/2019 11:45 AM [name], PT [facility] None  05/27/2019 11:45 AM [name], PT [facility] None  05/29/2019 11:45 AM [name], PT [facility] None  05/29/2019  3:00 PM Liane Comber, NP [facility] None  06/03/2019  8:50 AM [facility] DEVICE REMOTES [facility]  07/04/2019  8:15 AM [facility] DEVICE REMOTES [facility]  08/05/2019  9:30 AM [facility] DEVICE REMOTES [facility]  08/07/2019  8:30 AM [name], MD [facility] None  08/29/2019  2:30 PM [name], MD [facility] None  09/05/2019  7:55 AM [facility] DEVICE REMOTES [facility]  10/07/2019  8:50 AM [facility] DEVICE REMOTES [facility]  11/07/2019  8:50 AM [facility] DEVICE  REMOTES [facility]  12/09/2019  9:15 AM [facility] DEVICE REMOTES [facility]  01/09/2020  8:50 AM [facility] DEVICE REMOTES [facility]  02/10/2020  8:50 AM [facility] DEVICE REMOTES [facility]  03/12/2020  9:00 AM [facility] DEVICE REMOTES [facility]  04/13/2020  8:50 AM [facility] DEVICE REMOTES [facility]    ------------------------------------------------------------------------------------------------------------------   HPI BP (!) 140/92   Pulse 78   Temp (!) 97.3 F (36.3 C)   Ht 6' (1.829 m)   Wt 190 lb (86.2 kg)   SpO2 96%   BMI 25.77 kg/m   86 y.o.male presents accompanied by his wife for evaluation due to 1 week of dysuria and urinary frequency and urgency, has had several episodes of incontinence and sense of constantly needing to go. He also reports perineal discomfort with passing urine. He also reports strong urine odor, dark gold color, denies sediment or blood. He denies fever/chills, straining to pass urine, split or weak stream, flank pain, n/v/d, abdominal pain, penile discharge.   He was recently in hospital for colonoscopy/endoscopy on 04/24/2019 for acute anemia workup, with most recent CBC demonstrating anemia nearly resolved. He denies having abx or foley at that time.   He does not have hx of BPH; PSAs have been low. He does report remote history of prostatitis followed by local long retired urologist which was apparently very difficult to treat requiring extended close follow up.   Lab Results  Component Value Date   PSA 1.1 02/20/2019   PSA 1.9 01/30/2018   PSA 1.0 11/29/2016   Lab Results  Component Value Date   WBC  11.8 (H) 02/20/2019   HGB 12.5 (L) 03/13/2019   HCT 40.8 02/20/2019   MCV 98.8 02/20/2019   PLT 211 02/20/2019      Past Medical History:  Diagnosis Date  . 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 Colon polyps   . Coronary atherosclerosis of  unspecified type of vessel, native or graft   . Diabetes ([place])   . Difficult intubation   . Esophageal reflux   . High cholesterol   . History of IBS 02/27/2009  . Hypertension    pt denies, he says he has a h/o hypotension. If BP up he adjusts the Florinef  . Orthostatic hypotension    "BP has been dropping alot when I stand up for the last month or so" (02/17/2016)  . Vitamin B 12 deficiency   . Vitamin D deficiency      Allergies  Allergen Reactions  . Cymbalta [Duloxetine Hcl] Other (See Comments)    Dizziness, hallucinations.   . Keflex [Cephalexin] Other (See Comments)    dizziness  . Simvastatin Other (See Comments)    Joint pain  . Sudafed [Pseudoephedrine] Other (See Comments)    Dizziness    Current Outpatient Medications on File Prior to Visit  Medication Sig  . acetaminophen (TYLENOL) 500 MG tablet Take 1,000 mg by mouth 2 (two) times daily as needed for headache (pain).   . Ascorbic Acid (VITAMIN C) 500 MG CAPS Take 1 capsule by mouth daily.  Marland Kitchen aspirin EC 81 MG tablet Take 1 tablet (81 mg total) by mouth daily.  . butalbital-acetaminophen-caffeine (FIORICET) 50-325-40 MG tablet TAKE 1 TABLET EVERY 4 HOURS ONLY IF SEVERE HEADACHE (Patient taking differently: Take 1 tablet by mouth every 4 (four) hours as needed for headache. Take 1 tablet every 4 hours ONLY if  Severe Headache)  . Calcium Carb-Cholecalciferol (CALCIUM+D3 PO) Take 1 tablet by mouth daily.  . Cholecalciferol (VITAMIN D-3) 5000 units TABS Take 5,000 Units by mouth daily.  . Ferrous Sulfate (IRON SLOW RELEASE) 140 (45 Fe) MG TBCR Take 1 tablet by mouth daily.  . fludrocortisone (FLORINEF) 0.1 MG tablet TAKE 2 TABLETS BY MOUTH EVERY DAY  . gabapentin (NEURONTIN) 600 MG tablet Take 1/2    to   1   tablet 2 x /day for Chronic Pain  . glucose blood test strip Check blood sugar 1 time daily-[id number]  . Magnesium 500 MG TABS Take 1 tablet by mouth daily.   . Multiple Vitamin (MULTIVITAMIN WITH MINERALS) TABS  tablet Take 1 tablet by mouth daily.  Marland Kitchen olmesartan (BENICAR) 40 MG tablet Take 20-40 mg by mouth as needed.  . Omega-3 Fatty Acids (FISH OIL OMEGA-3 PO) Take 1 capsule by mouth daily.  Marland Kitchen oxyCODONE-acetaminophen (PERCOCET/ROXICET) 5-325 MG tablet Take 1 tablet by mouth every 6 (six) hours as needed for severe pain.  . pantoprazole (PROTONIX) 40 MG tablet Take 1 tablet Daily for Indigestion & Heartburn  . polyethylene glycol (MIRALAX / GLYCOLAX) packet Take 17 g by mouth daily. (Patient taking differently: Take 17 g by mouth 2 (two) times daily. )  . POTASSIUM CHLORIDE PO Take 595 mg by mouth daily.   . predniSONE (DELTASONE) 5 MG tablet Take 2 tablets in the Morning & 1 tablet in the Afternoon (Patient taking differently: Take 5 mg by mouth 2 (two) times daily with a meal. Take 1 tablet in the Morning & 1 tablet in the Afternoon)  . vitamin B-12 (CYANOCOBALAMIN) 1000 MCG tablet Take 1,000 mcg by mouth daily.  Marland Kitchen  Zinc 50 MG TABS Take 1 tablet by mouth daily.   No current facility-administered medications on file prior to visit.    ROS: all negative except above.   Physical Exam:  BP (!) 140/92   Pulse 78   Temp (!) 97.3 F (36.3 C)   Ht 6' (1.829 m)   Wt 190 lb (86.2 kg)   SpO2 96%   BMI 25.77 kg/m   General Appearance: Well dressed elderly male, thin, in no apparent distress. Eyes: PERRLA, conjunctiva no swelling or erythema ENT/Mouth: Ext aud canals clear, TMs without erythema, bulging. Mask in place; oral exam deferred. Hearing normal.  Neck: Supple, thyroid normal.  Respiratory: Respiratory effort normal, BS equal bilaterally without rales, rhonchi, wheezing or stridor.  Cardio: RRR with no MRGs. Brisk peripheral pulses without edema.  Abdomen: Soft, + BS.  Non tender, no guarding, rebound, notable hernias, palpable masses. Lymphatics: Non tender without lymphadenopathy.  Musculoskeletal: no obvious deformity; leg adductors non-tender; mainly in wheelchair, can stand and ambulate  short distances with assistance.  Skin: Warm, dry without rashes, lesions, ecchymosis.  Neuro: Normal muscle tone Psych: Awake and oriented X 3, normal affect, Insight and Judgment appropriate.  Male genitalia: wife present for exam no bladder distension noted Penis: normal, no lesions and circumcised Urethral Meatus: normal Testicles: normal, no masses Scrotum: normal Epididymis: normal Vas deferens: normal Seminal Vesicles: nonpalpable: bilateral Skin of perineum: normal Rectal examination: anal sphincter normal and external hemorrhoids Prostate examination: tender: bilateral without notably enlarged, boggy; no distinct lesions Bladder: no bladder distension noted   Izora Ribas, NP 10:26 AM [name]

## 2019-05-15 NOTE — Patient Instructions (Addendum)
    # [id number]  Ask about [place] giving out vaccines to the public     Individuals seeking information about the vaccines and state's phased distribution plan can learn more by going to - [email or web address]  -[email or web address]   And continue to monitor the county's website and press releases.   And you can call the [place] starting on Jan 8th at 8 AM to schedule for a vaccine. [phone].

## 2019-05-16 ENCOUNTER — Other Ambulatory Visit: Payer: Self-pay | Admitting: Adult Health

## 2019-05-16 DIAGNOSIS — R944 Abnormal results of kidney function studies: Secondary | ICD-10-CM

## 2019-05-17 LAB — URINE CULTURE
MICRO NUMBER:: 10039184
SPECIMEN QUALITY:: ADEQUATE

## 2019-05-17 LAB — CBC WITH DIFFERENTIAL/PLATELET
Absolute Monocytes: 504 cells/uL (ref 200–950)
Basophils Absolute: 42 cells/uL (ref 0–200)
Basophils Relative: 0.5 %
Eosinophils Absolute: 185 cells/uL (ref 15–500)
Eosinophils Relative: 2.2 %
HCT: 37.2 % — ABNORMAL LOW (ref 38.5–50.0)
Hemoglobin: 12.2 g/dL — ABNORMAL LOW (ref 13.2–17.1)
Lymphs Abs: 1798 cells/uL (ref 850–3900)
MCH: 33 pg (ref 27.0–33.0)
MCHC: 32.8 g/dL (ref 32.0–36.0)
MCV: 100.5 fL — ABNORMAL HIGH (ref 80.0–100.0)
MPV: 9.6 fL (ref 7.5–12.5)
Monocytes Relative: 6 %
Neutro Abs: 5872 cells/uL (ref 1500–7800)
Neutrophils Relative %: 69.9 %
Platelets: 218 10*3/uL (ref 140–400)
RBC: 3.7 10*6/uL — ABNORMAL LOW (ref 4.20–5.80)
RDW: 13.2 % (ref 11.0–15.0)
Total Lymphocyte: 21.4 %
WBC: 8.4 10*3/uL (ref 3.8–10.8)

## 2019-05-17 LAB — BASIC METABOLIC PANEL WITH GFR
BUN/Creatinine Ratio: 14 (calc) (ref 6–22)
BUN: 23 mg/dL (ref 7–25)
CO2: 34 mmol/L — ABNORMAL HIGH (ref 20–32)
Calcium: 9.7 mg/dL (ref 8.6–10.3)
Chloride: 104 mmol/L (ref 98–110)
Creat: 1.67 mg/dL — ABNORMAL HIGH (ref 0.70–1.11)
GFR, Est African American: 43 mL/min/{1.73_m2} — ABNORMAL LOW (ref >=60)
GFR, Est Non African American: 37 mL/min/{1.73_m2} — ABNORMAL LOW (ref >=60)
Glucose, Bld: 165 mg/dL — ABNORMAL HIGH (ref 65–99)
Potassium: 5.8 mmol/L — ABNORMAL HIGH (ref 3.5–5.3)
Sodium: 142 mmol/L (ref 135–146)

## 2019-05-17 LAB — URINALYSIS W MICROSCOPIC + REFLEX CULTURE
Bacteria, UA: NONE SEEN /HPF
Bilirubin Urine: NEGATIVE
Glucose, UA: NEGATIVE
Hyaline Cast: NONE SEEN /LPF
Ketones, ur: NEGATIVE
Nitrites, Initial: NEGATIVE
Specific Gravity, Urine: 1.016 (ref 1.001–1.03)
Squamous Epithelial / HPF: NONE SEEN /HPF (ref <=5)
WBC, UA: >=60 /HPF — AB (ref 0–5)
pH: 6.5 (ref 5.0–8.0)

## 2019-05-17 LAB — CULTURE INDICATED

## 2019-05-18 ENCOUNTER — Other Ambulatory Visit: Payer: Self-pay | Admitting: Adult Health

## 2019-05-18 MED ORDER — AMOXICILLIN 500 MG PO TABS: 500.0000 mg | ORAL_TABLET | Freq: Three times a day (TID) | ORAL | 0 refills | Status: DC

## 2019-05-20 ENCOUNTER — Encounter: Payer: Medicare Other | Admitting: Physical Therapy

## 2019-05-21 ENCOUNTER — Ambulatory Visit: Payer: Medicare Other | Attending: Internal Medicine

## 2019-05-21 DIAGNOSIS — Z23 Encounter for immunization: Secondary | ICD-10-CM | POA: Insufficient documentation

## 2019-05-21 NOTE — Progress Notes (Signed)
   Covid-19 Vaccination Clinic  Name:  [name]    MRN: [id number] DOB: February 18, 1934  05/21/2019  [name] was observed post Covid-19 immunization for 15 minutes without incidence. He was provided with Vaccine Information Sheet and instruction to access the V-Safe system.   [name] was instructed to call 911 with any severe reactions post vaccine: Marland Kitchen Difficulty breathing  . Swelling of your face and throat  . A fast heartbeat  . A bad rash all over your body  . Dizziness and weakness    Immunizations Administered    Name Date Dose VIS Date Route   Pfizer COVID-19 Vaccine 05/21/2019  1:54 PM 0.3 mL 04/12/2019 Intramuscular   Manufacturer: [employer], [employer]   Lot: [id number]   [place]: [id number]

## 2019-05-22 ENCOUNTER — Other Ambulatory Visit: Payer: Self-pay

## 2019-05-22 ENCOUNTER — Ambulatory Visit (INDEPENDENT_AMBULATORY_CARE_PROVIDER_SITE_OTHER): Payer: Medicare Other | Admitting: Physical Therapy

## 2019-05-22 DIAGNOSIS — G8929 Other chronic pain: Secondary | ICD-10-CM

## 2019-05-22 DIAGNOSIS — M5441 Lumbago with sciatica, right side: Secondary | ICD-10-CM | POA: Diagnosis not present

## 2019-05-22 DIAGNOSIS — R29818 Other symptoms and signs involving the nervous system: Secondary | ICD-10-CM

## 2019-05-22 DIAGNOSIS — R2689 Other abnormalities of gait and mobility: Secondary | ICD-10-CM

## 2019-05-22 DIAGNOSIS — M6281 Muscle weakness (generalized): Secondary | ICD-10-CM

## 2019-05-22 DIAGNOSIS — R42 Dizziness and giddiness: Secondary | ICD-10-CM

## 2019-05-22 NOTE — Therapy (Signed)
[facility] Therapy [address] [place], [place], [address] Phone: [phone]   Fax:  [phone]  Physical Therapy Treatment  Patient Details  Name: [name] MRN: [id number] Date of Birth: [birth year] Referring Provider (PT): [name], MD   Encounter Date: 05/22/2019  PT End of Session - 05/22/19 1238    Visit Number  4    Number of Visits  12    Date for PT Re-Evaluation  06/11/19    Authorization Type  [employer] MCR    PT Start Time  1147    PT Stop Time  1230    PT Time Calculation (min)  43 min    Activity Tolerance  Patient tolerated treatment well    Behavior During Therapy  [facility] for tasks assessed/performed       Past Medical History:  Diagnosis Date  . 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 Colon polyps   . Coronary atherosclerosis of unspecified type of vessel, native or graft   . Diabetes ([facility])   . Difficult intubation   . Esophageal reflux   . High cholesterol   . History of IBS 02/27/2009  . Hypertension    pt denies, he says he has a h/o hypotension. If BP up he adjusts the Florinef  . Orthostatic hypotension    "BP has been dropping alot when I stand up for the last month or so" (02/17/2016)  . Prostatitis   . Vitamin B 12 deficiency   . Vitamin D deficiency     Past Surgical History:  Procedure Laterality Date  . BIOPSY  06/25/2018   Procedure: BIOPSY;  Surgeon: Rush Landmark Telford Nab., MD;  Location: [place];  Service: Gastroenterology;;  . BIOPSY  04/24/2019   Procedure: BIOPSY;  Surgeon: Irving Copas., MD;  Location: [facility];  Service: Gastroenterology;;  . BLADDER SURGERY  1969   traumatic pelvic fractures, urethral and bladder repair  . CARDIAC CATHETERIZATION N/A 07/01/2015   Procedure: Left Heart Cath and Coronary Angiography;  Surgeon: Wellington Hampshire, MD;  Location: [place] [facility];  Service: Cardiovascular;  Laterality: N/A;  . COLON RESECTION N/A 05/17/2017   Procedure: DIAGNOSTIC  LAPAROSCOPY,;  Surgeon: Leighton Ruff, MD;  [facility]: [facility];  Service: General;  Laterality: N/A;  . COLONOSCOPY WITH PROPOFOL N/A 04/24/2019   Procedure: COLONOSCOPY WITH PROPOFOL;  Surgeon: Irving Copas., MD;  [facility];  Service: Gastroenterology;  Laterality: N/A;  . CORONARY ANGIOPLASTY WITH STENT PLACEMENT    . CORONARY ARTERY BYPASS GRAFT N/A 07/06/2015   Procedure: CORONARY ARTERY BYPASS GRAFTING (CABG)x 4   utilizing the left internal mammary artery and endoscopically harvested bilateral  sapheneous vein.;  Surgeon: Ivin Poot, MD;  Location: [place];  Service: Open Heart Surgery;  Laterality: N/A;  . ESOPHAGEAL DILATION  04/24/2019   Procedure: ESOPHAGEAL DILATION;  Surgeon: Rush Landmark Telford Nab., MD;  [facility];  Service: Gastroenterology;;  . ESOPHAGOGASTRODUODENOSCOPY (EGD) WITH PROPOFOL N/A 06/25/2018   Procedure: ESOPHAGOGASTRODUODENOSCOPY (EGD) WITH PROPOFOL;  Surgeon: Irving Copas., MD;  Location: [place];  Service: Gastroenterology;  Laterality: N/A;  . ESOPHAGOGASTRODUODENOSCOPY (EGD) WITH PROPOFOL N/A 04/24/2019   Procedure: ESOPHAGOGASTRODUODENOSCOPY (EGD) WITH PROPOFOL;  Surgeon: Rush Landmark Telford Nab., MD;  [facility];  Service: Gastroenterology;  Laterality: N/A;  . HEMOSTASIS CLIP PLACEMENT  04/24/2019   Procedure: HEMOSTASIS CLIP PLACEMENT;  Surgeon: Irving Copas., MD;  [facility];  Service: Gastroenterology;;  . KNEE SURGERY    . LOOP RECORDER  INSERTION N/A 02/19/2018   Procedure: LOOP RECORDER INSERTION;  Surgeon: Deboraha Sprang, MD;  Location: [place] [facility];  Service: Cardiovascular;  Laterality: N/A;  . POLYPECTOMY  04/24/2019   Procedure: POLYPECTOMY;  Surgeon: Rush Landmark Telford Nab., MD;  Location: [facility];  Service: Gastroenterology;;  . Lia Foyer LIFTING INJECTION  04/24/2019   Procedure: SUBMUCOSAL LIFTING INJECTION;  Surgeon: Irving Copas., MD;   [facility];  Service: Gastroenterology;;  . TEE WITHOUT CARDIOVERSION N/A 07/06/2015   Procedure: TRANSESOPHAGEAL ECHOCARDIOGRAM (TEE);  Surgeon: Ivin Poot, MD;  Location: [place];  Service: Open Heart Surgery;  Laterality: N/A;    There were no vitals filed for this visit.  Subjective Assessment - 05/22/19 1231    Subjective  Relays continued dizziness but this is normal, he got the COVID vaccine so his arm is real sore    Pertinent History  PMH: CVA X5, DM, CKD, CAD, CABGX4,    Limitations  Lifting;Standing;Walking;House hold activities    How long can you stand comfortably?  2 min    How long can you walk comfortably?  household distances    Diagnostic tests  Imaging shows "Spinal stenosis of lumbar region with neurogenic claudication, advanced lumbar DDD, Scoliosis"    Patient Stated Goals  stand and walk better, improve leg strength and pain    Pain Onset  More than a month ago                       [facility] Adult PT Treatment/Exercise - 05/22/19 0001      Transfers   Comments  sit to stand: x5 with practice for mehcanics and hand placement       Ambulation/Gait   Ambulation/Gait  Yes    Ambulation/Gait Assistance  4: Min guard    Ambulation Distance (Feet)  150 Feet   then 50 ft X 2   Assistive device  Rollator    Gait velocity  slow      Lumbar Exercises: Aerobic   Nustep  5 min L4 for LE strength      Knee/Hip Exercises: Machines for Strengthening   Total Gym Leg Press  shuttle press 50 lbs bilat push, 20 reps then 17 reps      Knee/Hip Exercises: Standing   Other Standing Knee Exercises  at counter top with bilat UE support for step ups 6 inch step X 5 reps on Lt leg, unable to do on Rt due to weakness so dropped down to 4 inch step for 10 reps on Rt. Then seated rest break and performed mini squats holding at sink with rollator seat direclty behind him, he needs cues for more hip hinge posteriorly, then able to perform after max cuing and  manual facilitation, did 10 reps correctly.     Other Standing Knee Exercises  weaving through cones with rollator to work on turning balance and rollator management, overall min guard with this      Knee/Hip Exercises: Seated   Long Arc Quad  Both;2 sets;10 reps    Long Arc Quad Weight  2 lbs.    Marching  Both;2 sets;10 reps    Marching Weights  2 lbs.    Hamstring Curl  Both;15 reps    Hamstring Limitations  L2 band             PT Education - 05/22/19 1238    Education Details  benefit of getting ankle weights for home to further improve leg strength  Person(s) Educated  Patient;Spouse    Methods  Explanation    Comprehension  Verbalized understanding          PT Long Term Goals - 04/30/19 1554      PT LONG TERM GOAL #1   Title  independent with updated HEP (Target all LTGs 06/11/2019)    Time  6    Period  Weeks    Status  New      PT LONG TERM GOAL #2   Title  Improve lumbar ROM to [facility] in standing (at least 60%).    Time  6    Period  Weeks    Status  New      PT LONG TERM GOAL #3   Title  Improve bilat leg strength to at least 4+/5 MMT measured in sitting to improve function    Time  6    Period  Weeks    Status  New      PT LONG TERM GOAL #4   Title  Will improve 5TSTS to <25 sec to show improved leg strength and endurance    Baseline  37 sec    Time  6    Period  Weeks    Status  New      PT LONG TERM GOAL #5   Title  Pt will be albe to stand and ambulate at least 10-15 minutes or 300 ft with LRAD to show improved ability for limited community ambulation    Time  6    Period  Weeks    Status  New            Plan - 05/22/19 1238    Clinical Impression Statement  Again able to progress strength, functional activites, and endurance but still has significant deficits. Rt leg remains weaker than Lt. PT will continue ot progress as able. He had no complaints during sesison but needs frequent rest breaks do to fatigue.    Personal Factors and  Comorbidities  Comorbidity 3+    Comorbidities  PMH: CVA X5, DM, CKD, CAD, CABGX4,    Examination-Activity Limitations  Bed Mobility;Bend;Carry;Squat;Stairs;Lift;Stand;Transfers;Locomotion Level    Examination-Participation Restrictions  Meal Prep;Cleaning;Community Activity;Shop    Stability/Clinical Decision Making  Evolving/Moderate complexity    Rehab Potential  Fair    PT Frequency  2x / week   1-2   PT Duration  6 weeks    PT Treatment/Interventions  ADLs/Self Care Home Management;Cryotherapy;Iontophoresis 4mg /ml Dexamethasone;Moist Heat;Gait training;Stair training;Functional mobility training;Therapeutic activities;Therapeutic exercise;Balance training;Neuromuscular re-education;Manual techniques;Passive range of motion;Taping    PT Next Visit Plan  review and update HEP PRN. needs bilat leg strength Rt is weaker than Lt, needs balance, gait with rollator. lumbar stretching    PT Home Exercise Plan  Access Code: [email or web address]    Consulted and Agree with Plan of Care  Patient       Patient will benefit from skilled therapeutic intervention in order to improve the following deficits and impairments:  Abnormal gait, Decreased activity tolerance, Decreased balance, Decreased endurance, Decreased range of motion, Decreased strength, Hypomobility, Difficulty walking, Impaired flexibility, Postural dysfunction, Pain  Visit Diagnosis: Chronic bilateral low back pain with right-sided sciatica  Other abnormalities of gait and mobility  Muscle weakness (generalized)  Other symptoms and signs involving the nervous system  Dizziness and giddiness     Problem List Patient Active Problem List   Diagnosis Date Noted  . Chronic bilateral low back pain without sciatica 02/07/2019  . Statin intolerance 11/16/2018  .  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 Hyperlipidemia, mixed 08/15/2018  . Low back pain 07/18/2018  . Acute blood loss anemia 06/24/2018  . History of lacunar cerebrovascular  accident (CVA) 02/12/2018  . Dizziness 02/12/2018  . Myofascial pain 12/18/2017  . Spondylosis without myelopathy or radiculopathy, lumbar region 12/18/2017  . Lumbar radiculopathy, right 12/18/2017  . Cholelithiasis 05/17/2017  . Sinus Bradycardia 05/17/2017  . Thoracic radiculopathy 04/12/2017  . Primary osteoarthritis of right knee 12/14/2016  . DDD (degenerative disc disease), lumbar 10/10/2016  . Lumbar facet arthropathy 08/29/2016  . Idiopathic scoliosis 03/22/2016  . Diabetic neuropathy ([place]) 03/22/2016  . Dysautonomia orthostatic hypotension syndrome ([place]) 02/25/2016  . CKD stage 3 due to type 2 diabetes mellitus ([place]) 02/19/2016  . Coronary artery disease involving coronary bypass graft of native heart without angina pectoris   . Diabetes mellitus type 2, diet-controlled ([place])   . 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 Medication management 08/11/2015  . 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 PVD (peripheral vascular disease) ([place]) 01/01/2014  . Hyperlipidemia associated with type 2 diabetes mellitus ([place]) 04/11/2013  . Supine hypertension   . Vitamin D deficiency   . Vitamin B 12 deficiency   . Coronary atherosclerosis- s/p PCI to LAD in 2009 and PCI to RCA in 2011 02/27/2009  . Abdominal aortic aneurysm ([place]) 02/27/2009  . GERD 02/27/2009    [name] 05/22/2019, 12:41 PM  [facility] Physical Therapy [address] [place], [place], [address] Phone: [phone]   Fax:  [phone]  Name: [name] MRN: [id number] Date of Birth: 1933-09-10

## 2019-05-27 ENCOUNTER — Ambulatory Visit (INDEPENDENT_AMBULATORY_CARE_PROVIDER_SITE_OTHER): Payer: Medicare Other | Admitting: Physical Therapy

## 2019-05-27 ENCOUNTER — Other Ambulatory Visit: Payer: Self-pay

## 2019-05-27 DIAGNOSIS — R2689 Other abnormalities of gait and mobility: Secondary | ICD-10-CM | POA: Diagnosis not present

## 2019-05-27 DIAGNOSIS — M5441 Lumbago with sciatica, right side: Secondary | ICD-10-CM

## 2019-05-27 DIAGNOSIS — R42 Dizziness and giddiness: Secondary | ICD-10-CM

## 2019-05-27 DIAGNOSIS — G8929 Other chronic pain: Secondary | ICD-10-CM

## 2019-05-27 DIAGNOSIS — M6281 Muscle weakness (generalized): Secondary | ICD-10-CM

## 2019-05-27 DIAGNOSIS — R29818 Other symptoms and signs involving the nervous system: Secondary | ICD-10-CM

## 2019-05-27 NOTE — Therapy (Signed)
[facility] Physical Therapy [address] [place], [place], [address] Phone: [phone]   Fax:  [phone]  Physical Therapy Treatment  Patient Details  Name: [name] MRN: [id number] Date of Birth: Oct 26, 1933 Referring Provider (PT): [name], MD   Encounter Date: 05/27/2019  PT End of Session - 05/27/19 1417    Visit Number  5    Number of Visits  12    Date for PT Re-Evaluation  06/11/19    Authorization Type  [employer] MCR    PT Start Time  [id number]    PT Stop Time  1230    PT Time Calculation (min)  45 min    Activity Tolerance  Patient tolerated treatment well    Behavior During Therapy  [facility] for tasks assessed/performed       Past Medical History:  Diagnosis Date  . 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 Colon polyps   . Coronary atherosclerosis of unspecified type of vessel, native or graft   . Diabetes ([place])   . Difficult intubation   . Esophageal reflux   . High cholesterol   . History of IBS 02/27/2009  . Hypertension    pt denies, he says he has a h/o hypotension. If BP up he adjusts the Florinef  . Orthostatic hypotension    "BP has been dropping alot when I stand up for the last month or so" (02/17/2016)  . Prostatitis   . Vitamin B 12 deficiency   . Vitamin D deficiency     Past Surgical History:  Procedure Laterality Date  . BIOPSY  06/25/2018   Procedure: BIOPSY;  Surgeon: Rush Landmark Telford Nab., MD;  Location: [place];  Service: Gastroenterology;;  . BIOPSY  04/24/2019   Procedure: BIOPSY;  Surgeon: Irving Copas., MD;  Location: [facility];  Service: Gastroenterology;;  . BLADDER SURGERY  1969   traumatic pelvic fractures, urethral and bladder repair  . CARDIAC CATHETERIZATION N/A 07/01/2015   Procedure: Left Heart Cath and Coronary Angiography;  Surgeon: Wellington Hampshire, MD;  Location: [place] [facility];  Service: Cardiovascular;  Laterality: N/A;  . COLON RESECTION N/A 05/17/2017   Procedure: DIAGNOSTIC  LAPAROSCOPY,;  Surgeon: Leighton Ruff, MD;  [facility];  Service: General;  Laterality: N/A;  . COLONOSCOPY WITH PROPOFOL N/A 04/24/2019   Procedure: COLONOSCOPY WITH PROPOFOL;  Surgeon: Irving Copas., MD;  [facility];  Service: Gastroenterology;  Laterality: N/A;  . CORONARY ANGIOPLASTY WITH STENT PLACEMENT    . CORONARY ARTERY BYPASS GRAFT N/A 07/06/2015   Procedure: CORONARY ARTERY BYPASS GRAFTING (CABG)x 4   utilizing the left internal mammary artery and endoscopically harvested bilateral  sapheneous vein.;  Surgeon: Ivin Poot, MD;  Location: [place];  Service: Open Heart Surgery;  Laterality: N/A;  . ESOPHAGEAL DILATION  04/24/2019   Procedure: ESOPHAGEAL DILATION;  Surgeon: Rush Landmark Telford Nab., MD;  [facility];  Service: Gastroenterology;;  . ESOPHAGOGASTRODUODENOSCOPY (EGD) WITH PROPOFOL N/A 06/25/2018   Procedure: ESOPHAGOGASTRODUODENOSCOPY (EGD) WITH PROPOFOL;  Surgeon: Irving Copas., MD;  Location: [place];  Service: Gastroenterology;  Laterality: N/A;  . ESOPHAGOGASTRODUODENOSCOPY (EGD) WITH PROPOFOL N/A 04/24/2019   Procedure: ESOPHAGOGASTRODUODENOSCOPY (EGD) WITH PROPOFOL;  Surgeon: Rush Landmark Telford Nab., MD;  [facility];  Service: Gastroenterology;  Laterality: N/A;  . HEMOSTASIS CLIP PLACEMENT  04/24/2019   Procedure: HEMOSTASIS CLIP PLACEMENT;  Surgeon: Irving Copas., MD;  [facility];  Service: Gastroenterology;;  . KNEE SURGERY    . LOOP RECORDER  INSERTION N/A 02/19/2018   Procedure: LOOP RECORDER INSERTION;  Surgeon: Deboraha Sprang, MD;  Location: [place] [facility];  Service: Cardiovascular;  Laterality: N/A;  . POLYPECTOMY  04/24/2019   Procedure: POLYPECTOMY;  Surgeon: Rush Landmark Telford Nab., MD;  Location: [facility];  Service: Gastroenterology;;  . Lia Foyer LIFTING INJECTION  04/24/2019   Procedure: SUBMUCOSAL LIFTING INJECTION;  Surgeon: Irving Copas., MD;   [facility];  Service: Gastroenterology;;  . TEE WITHOUT CARDIOVERSION N/A 07/06/2015   Procedure: TRANSESOPHAGEAL ECHOCARDIOGRAM (TEE);  Surgeon: Ivin Poot, MD;  Location: [place];  Service: Open Heart Surgery;  Laterality: N/A;    There were no vitals filed for this visit.  Subjective Assessment - 05/27/19 1407    Subjective  My legs feel weak and tired today    Pertinent History  PMH: CVA X5, DM, CKD, CAD, CABGX4,    Limitations  Lifting;Standing;Walking;House hold activities    How long can you stand comfortably?  2 min    How long can you walk comfortably?  household distances    Diagnostic tests  Imaging shows "Spinal stenosis of lumbar region with neurogenic claudication, advanced lumbar DDD, Scoliosis"    Patient Stated Goals  stand and walk better, improve leg strength and pain    Pain Onset  More than a month ago         [facility] PT Assessment - 05/27/19 0001      Transfers   Five time sit to stand comments   29 seconds from standard chair using arms to push ups with    Comments  10 reps total 2 sets of 5 for 5TSTS testing                   [facility] Adult PT Treatment/Exercise - 05/27/19 0001      Ambulation/Gait   Ambulation/Gait  Yes    Ambulation/Gait Assistance  5: Supervision    Ambulation Distance (Feet)  330 Feet    Assistive device  Rollator    Gait velocity  slow      Lumbar Exercises: Aerobic   Nustep  7 min L6 UE/LE for endurance and leg strength      Knee/Hip Exercises: Standing   Other Standing Knee Exercises  at counter top with bilat UE support for step ups 6 inch step X 10 reps on Lt leg, unable to do on Rt due to weakness so dropped down to 4 inch step for 10 reps on Rt. Then seated rest break and performed mini squats holding at sink with rollator seat direclty behind him, he needs cues for more hip hinge posteriorly, then able to perform after max cuing and manual facilitation, did 10 reps correctly.     Other Standing Knee  Exercises  sidestepping at counter top with UE support up/down X 3 reps      Knee/Hip Exercises: Seated   Long Arc Quad  Both;2 sets;10 reps    Long Arc Quad Weight  2 lbs.                  PT Long Term Goals - 04/30/19 1554      PT LONG TERM GOAL #1   Title  independent with updated HEP (Target all LTGs 06/11/2019)    Time  6    Period  Weeks    Status  New      PT LONG TERM GOAL #2   Title  Improve lumbar ROM to [facility] in standing (at least  60%).    Time  6    Period  Weeks    Status  New      PT LONG TERM GOAL #3   Title  Improve bilat leg strength to at least 4+/5 MMT measured in sitting to improve function    Time  6    Period  Weeks    Status  New      PT LONG TERM GOAL #4   Title  Will improve 5TSTS to <25 sec to show improved leg strength and endurance    Baseline  37 sec    Time  6    Period  Weeks    Status  New      PT LONG TERM GOAL #5   Title  Pt will be albe to stand and ambulate at least 10-15 minutes or 300 ft with LRAD to show improved ability for limited community ambulation    Time  6    Period  Weeks    Status  New            Plan - 05/27/19 1417    Clinical Impression Statement  He is making some progress with PT and was able to demonstrate improved time on his 5 times sit to stand test. he was also able to show improved ambulation endurance to 390 ft with rollator and supervision. PT will continue to progress leg strength, endurance and gait as able.    Personal Factors and Comorbidities  Comorbidity 3+    Comorbidities  PMH: CVA X5, DM, CKD, CAD, CABGX4,    Examination-Activity Limitations  Bed Mobility;Bend;Carry;Squat;Stairs;Lift;Stand;Transfers;Locomotion Level    Examination-Participation Restrictions  Meal Prep;Cleaning;Community Activity;Shop    Stability/Clinical Decision Making  Evolving/Moderate complexity    Rehab Potential  Fair    PT Frequency  2x / week   1-2   PT Duration  6 weeks    PT Treatment/Interventions   ADLs/Self Care Home Management;Cryotherapy;Iontophoresis 4mg /ml Dexamethasone;Moist Heat;Gait training;Stair training;Functional mobility training;Therapeutic activities;Therapeutic exercise;Balance training;Neuromuscular re-education;Manual techniques;Passive range of motion;Taping    PT Next Visit Plan  review and update HEP PRN. needs bilat leg strength Rt is weaker than Lt, needs balance, gait with rollator. lumbar stretching    PT Home Exercise Plan  Access Code: [email or web address]    Consulted and Agree with Plan of Care  Patient       Patient will benefit from skilled therapeutic intervention in order to improve the following deficits and impairments:  Abnormal gait, Decreased activity tolerance, Decreased balance, Decreased endurance, Decreased range of motion, Decreased strength, Hypomobility, Difficulty walking, Impaired flexibility, Postural dysfunction, Pain  Visit Diagnosis: Chronic bilateral low back pain with right-sided sciatica  Other abnormalities of gait and mobility  Muscle weakness (generalized)  Other symptoms and signs involving the nervous system  Dizziness and giddiness     Problem List Patient Active Problem List   Diagnosis Date Noted  . Chronic bilateral low back pain without sciatica 02/07/2019  . Statin intolerance 11/16/2018  . 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 Hyperlipidemia, mixed 08/15/2018  . Low back pain 07/18/2018  . Acute blood loss anemia 06/24/2018  . History of lacunar cerebrovascular accident (CVA) 02/12/2018  . Dizziness 02/12/2018  . Myofascial pain 12/18/2017  . Spondylosis without myelopathy or radiculopathy, lumbar region 12/18/2017  . Lumbar radiculopathy, right 12/18/2017  . Cholelithiasis 05/17/2017  . Sinus Bradycardia 05/17/2017  . Thoracic radiculopathy 04/12/2017  . Primary osteoarthritis of right knee 12/14/2016  . DDD (degenerative disc disease), lumbar 10/10/2016  .  Lumbar facet arthropathy 08/29/2016  . Idiopathic  scoliosis 03/22/2016  . Diabetic neuropathy ([place]) 03/22/2016  . Dysautonomia orthostatic hypotension syndrome ([place]) 02/25/2016  . CKD stage 3 due to type 2 diabetes mellitus ([place]) 02/19/2016  . Coronary artery disease involving coronary bypass graft of native heart without angina pectoris   . Diabetes mellitus type 2, diet-controlled ([place])   . 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 Medication management 08/11/2015  . 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 PVD (peripheral vascular disease) ([place]) 01/01/2014  . Hyperlipidemia associated with type 2 diabetes mellitus ([place]) 04/11/2013  . Supine hypertension   . Vitamin D deficiency   . Vitamin B 12 deficiency   . Coronary atherosclerosis- s/p PCI to LAD in 2009 and PCI to RCA in 2011 02/27/2009  . Abdominal aortic aneurysm ([place]) 02/27/2009  . GERD 02/27/2009    Silvestre Mesi 05/27/2019, 2:21 PM  [facility] Physical Therapy [address] [place], [place], [address] Phone: [phone]   Fax:  [phone]  Name: [name] MRN: [id number] Date of Birth: Sep 02, 1933

## 2019-05-28 ENCOUNTER — Encounter: Payer: Self-pay | Admitting: Adult Health

## 2019-05-28 NOTE — Progress Notes (Signed)
MEDICARE ANNUAL WELLNESS VISIT AND FOLLOW UP Assessment:   [name] was seen today for follow-up and medicare wellness.  Diagnoses and all orders for this visit:  Diabetes mellitus type 2, diet-controlled ([name]) Has been controlled in prediabetic range with lifstyle Continue diet and exercise.  Perform daily foot/skin check, notify office of any concerning changes.  -     Hemoglobin A1c  CKD (chronic kidney disease), stage III (HCC) Increase fluids, avoid NSAIDS, monitor sugars, will monitor -     CMP/GFR  Vitamin D deficiency At goal at recent check; continue to recommend supplementation for goal of 60-100 Defer vitamin D level  Mixed hyperlipidemia statin intolerant, failed repatha and numerous others via cardiology, declines further Continue low cholesterol diet and exercise.  Check lipid panel.  -     Lipid panel -     TSH  Medication management -     CBC with Differential/Platelet -     CMP/GFR -     Magnesium   Hx of Lacunar CVA Continue ASA, plavix Control blood pressure, cholesterol, glucose, increase exercise.  Follow up neurology as recommended  Dysautonomia orthostatic hypotension syndrome (HCC) Well controlled with current medications at home - florinef Monitor blood pressure at home - standing BPs only; patient to call if consistently greater than 130/80 or with very low readings below 90/60 Reminder to go to the ER if any CP, SOB, nausea, dizziness, severe HA, changes vision/speech, left arm numbness and tingling and jaw pain.  Gastroesophageal reflux disease, esophagitis presence not specified Well managed on current medications; continue PPI due to recent UGIB Discussed diet, avoiding triggers and other lifestyle changes  Diabetic polyneuropathy associated with other specified diabetes mellitus ([name]) Continue close monitoring of blood glucose, patient checks skin/feet daily, plan in place for constipation  CKD 3 Increase fluids, avoid NSAIDS, monitor  sugars, will monitor - CMP/GFR  Abdominal aortic aneurysm (AAA) without rupture ([name]) Followed by cardiology and vascular, AAA screening US annually   Coronary artery disease involving coronary bypass graft of native heart without angina pectoris Continue follow up with cardiology Continue close monitoring of blood pressure, cholesterol, blood glucose - continue ASA  PVD (peripheral vascular disease) ([name]) Continue follow up with cardiology Continue close monitoring of blood pressure, cholesterol, blood glucose - continue ASA  Other idiopathic scoliosis, lumbar region Continue f/u with ortho  Vitamin B 12 deficiency On supplementation; monitor B12 levels routinely at CPE Check CBC  S/P CABG x 4 Continue follow up cardiology  DDD (degenerative disc disease), lumbar Continue follow up spine/pain management  High fall risk Fall prevention discussed; currently doing PT; using walker/scooter at home Reports no falls since off of cymbalta  Prostatitis On day 10 of augmentin; continue 20 more days for 4 weeks Recheck UA, CRP, CMP/GFR   Over 30 minutes of exam, counseling, chart review, and critical decision making was performed  Future Appointments  Date Time Provider [place]  06/03/2019  8:50 AM [facility] DEVICE REMOTES [facility]  06/11/2019  1:15 PM [place] [facility]  06/12/2019 11:00 AM [name], PT [facility] None  06/14/2019 10:15 AM [place]ear[place] Hard R, PT [facility] None  06/17/2019  1:15 PM [name], PT [facility] None  06/19/2019 11:00 AM [name], PT [facility] None  06/25/2019  1:15 PM [name], PT [facility] None  06/27/2019 11:00 AM [name], PT [facility] None  07/02/2019 10:15 AM [name], PT [facility] None  07/04/2019  8:15 AM [facility]  DEVICE REMOTES [facility]  07/05/2019 11:00 AM [name], PT [facility] None  08/05/2019  9:30 AM [facility] DEVICE REMOTES [facility]   08/07/2019  8:30 AM [name], MD [facility] None  08/29/2019  2:30 PM [name], MD [facility] None  09/05/2019  7:55 AM [facility] DEVICE REMOTES [facility]  10/07/2019  8:50 AM [facility] DEVICE REMOTES [facility]  11/07/2019  8:50 AM [facility] DEVICE REMOTES [facility]  12/09/2019  9:15 AM [facility] DEVICE REMOTES [facility]  01/09/2020  8:50 AM [facility] DEVICE REMOTES [facility]  02/10/2020  8:50 AM [facility] DEVICE REMOTES [facility]  03/12/2020  9:00 AM [facility] DEVICE REMOTES [facility]  04/13/2020  8:50 AM [facility] DEVICE REMOTES [facility]     Plan:   During the course of the visit the patient was educated and counseled about appropriate screening and preventive services including:    Pneumococcal vaccine   Influenza vaccine  Prevnar 13  Td vaccine  Screening electrocardiogram  Colorectal cancer screening  Diabetes screening  Glaucoma screening  Nutrition counseling    Subjective:  [name] is a 86 y.o. male who presents for Medicare Annual Wellness Visit and 3 month follow up for HTN, hyperlipidemia, type 2 diabetes, and vitamin D Def.   Patient was hospitalized in Feb 2020 with GI bleed due to duodenal ulcers and required transfusion 2 units of pRBC's. He underwent follow up EGD/colonoscopies on 04/24/2019 which showed resolved ulcers but with 5 polyps, non-cancerous, no further follow up but recommended continue daily PPI.  He presented with UTI/prostatitis sx on 05/15/2019, culture showed enterococcus faecalis, was initiated on amoxicillin TID, reports doing well with symptoms fully resolved. Currently on day 10 of treatment with plan to continue 4 weeks.   He continues to follow up with pain management and ortho for ongoing back pain; he has complex chronic back and thoracic radicular pain history, was followed by Dr. Tessa Lerner  but retired and now following with Dr. Louanne Skye. He had ablation by Dr. Ernestina Patches 03/19/2020 which improved upper back pain but with ongoing lower back R hip, doing PT, uses walker and scooter at home. Using tylenol and gabapentin for pain.   Last year he was having disabling dizziness and MRI in 02/23/2018 found a small acute Rt Temporal stem CVA and he was started on Plavix w/his LD bASA. Patient had consultation by Dr Stanford Breed and had ECHO and Holter monitoring. Dr Jaynee Eagles and she recommended continuing Plavix, & strict control of  blood sugar and Lipids.   BMI is Body mass index is 25.77 kg/m., he has been working on diet and exercise, has been cutting portions,  Wt Readings from Last 3 Encounters:  05/29/19 190 lb (86.2 kg)  05/15/19 190 lb (86.2 kg)  05/09/19 183 lb (83 kg)   Patient has long hx/o labile  HTN predates since 1995. In 2005 and 2011, he underwent PCA/Stents and then CABG in 2017 and since then his HTN has evolved into orthostatic Hypotension with supine Hypertension.  His wife closely monitors his standing BP's & accordingly regulates his dosing of Florinef.  Has loop recorder per Dr. Stanford Breed.  He has AAA followed annually by Dr. Stanford Breed, recently referred to vascular due to interval left iliac arterial enlargement and was referred to Dr. Scot Dock who plans to monitor.  His blood pressure has been controlled at home (130s/70s), today their BP is BP: 140/90 He does workout. He denies chest pain, shortness of breath.   He is not on  cholesterol medication, was on repatha and numerous others via cardiology. Patient declined other medication trials. His cholesterol is not at goal. The cholesterol last visit was:   Lab Results  Component Value Date   CHOL 171 02/20/2019   HDL 32 (L) 02/20/2019   LDLCALC 104 (H) 02/20/2019   TRIG 230 (H) 02/20/2019   CHOLHDL 5.3 (H) 02/20/2019   He has been working on diet and exercise for diet-controlled T2 diabetes, he is on ASA, ARB, statin  intolerance and denies increased appetite, nausea, polydipsia, polyuria, visual disturbances and vomiting. He does have glucometer, fasting reportedly ranges 128-140. Last A1C in the office was:  Lab Results  Component Value Date   HGBA1C 6.3 (H) 02/20/2019   Last GFR Lab Results  Component Value Date   GFRNONAA 37 (L) 05/15/2019    Patient is on Vitamin D supplement and at goal:    Lab Results  Component Value Date   VD25OH 85 02/27/2019        Medication Review: Current Outpatient Medications on File Prior to Visit  Medication Sig Dispense Refill  . acetaminophen (TYLENOL) 500 MG tablet Take 1,000 mg by mouth 2 (two) times daily as needed for headache (pain).     Marland Kitchen aspirin EC 81 MG tablet Take 1 tablet (81 mg total) by mouth daily.    . butalbital-acetaminophen-caffeine (FIORICET) 50-325-40 MG tablet TAKE 1 TABLET EVERY 4 HOURS ONLY IF SEVERE HEADACHE (Patient taking differently: Take 1 tablet by mouth every 4 (four) hours as needed for headache. Take 1 tablet every 4 hours ONLY if  Severe Headache) 30 tablet 0  . Calcium Carb-Cholecalciferol (CALCIUM+D3 PO) Take 1 tablet by mouth daily.    . Cholecalciferol (VITAMIN D-3) 5000 units TABS Take 5,000 Units by mouth daily.    . Ferrous Sulfate (IRON SLOW RELEASE) 140 (45 Fe) MG TBCR Take 1 tablet by mouth daily.    . fludrocortisone (FLORINEF) 0.1 MG tablet TAKE 2 TABLETS BY MOUTH EVERY DAY 180 tablet 1  . gabapentin (NEURONTIN) 600 MG tablet Take 1/2    to   1   tablet 2 x /day for Chronic Pain 60 tablet 3  . glucose blood test strip Check blood sugar 1 time daily-DX-E11.9 100 each 5  . Multiple Vitamin (MULTIVITAMIN WITH MINERALS) TABS tablet Take 1 tablet by mouth daily.    Marland Kitchen olmesartan (BENICAR) 40 MG tablet Take 20-40 mg by mouth as needed.    . Omega-3 Fatty Acids (FISH OIL OMEGA-3 PO) Take 1 capsule by mouth daily.    . pantoprazole (PROTONIX) 40 MG tablet Take 1 tablet Daily for Indigestion & Heartburn 90 tablet 3  .  POTASSIUM CHLORIDE PO Take 595 mg by mouth daily.     . Zinc 50 MG TABS Take 1 tablet by mouth daily.    . Ascorbic Acid (VITAMIN C) 500 MG CAPS Take 1 capsule by mouth daily.    . Magnesium 500 MG TABS Take 1 tablet by mouth daily.     . polyethylene glycol (MIRALAX / GLYCOLAX) packet Take 17 g by mouth daily. (Patient not taking: Reported on 05/29/2019) 28 each 1  . vitamin B-12 (CYANOCOBALAMIN) 1000 MCG tablet Take 1,000 mcg by mouth daily.     No current facility-administered medications on file prior to visit.    Allergies: Allergies  Allergen Reactions  . Cymbalta [Duloxetine Hcl] Other (See Comments)    Dizziness, hallucinations.   . Keflex [Cephalexin] Other (See Comments)    dizziness  .  Simvastatin Other (See Comments)    Joint pain  . Sudafed [Pseudoephedrine] Other (See Comments)    Dizziness    Current Problems (verified) has Coronary atherosclerosis- s/p PCI to LAD in 2009 and PCI to RCA in 2011; Abdominal aortic aneurysm ([place]); GERD; Supine hypertension; Vitamin D deficiency; Vitamin B 12 deficiency; Hyperlipidemia associated with type 2 diabetes mellitus ([place]); PVD (peripheral vascular disease) ([place]); S/P CABG x 4; Medication management; Intercostal neuralgia; Coronary artery disease involving coronary bypass graft of native heart without angina pectoris; Diabetes mellitus type 2, diet-controlled ([place]); CKD stage 3 due to type 2 diabetes mellitus ([place]); Dysautonomia orthostatic hypotension syndrome ([place]); Idiopathic scoliosis; Diabetic neuropathy ([place]); Lumbar facet arthropathy; DDD (degenerative disc disease), lumbar; Primary osteoarthritis of right knee; Thoracic radiculopathy; Cholelithiasis; Sinus Bradycardia; Myofascial pain; Spondylosis without myelopathy or radiculopathy, lumbar region; Lumbar radiculopathy, right; History of lacunar cerebrovascular accident (CVA); Dizziness; Low back pain; Hyperlipidemia, mixed; Statin intolerance; and Chronic bilateral low back pain  without sciatica on their problem list.  Screening Tests Immunization History  Administered Date(s) Administered  . Influenza, High Dose Seasonal PF 12/30/2013, 02/02/2015, 01/27/2016, 02/20/2019  . Influenza-Unspecified 01/13/2013, 01/29/2017, 01/30/2018  . PFIZER SARS-COV-2 Vaccination 05/21/2019  . Pneumococcal Conjugate-13 02/14/2014  . Pneumococcal-Unspecified 06/15/2004  . Tetanus 10/01/2012  . Zoster 10/01/2012   Preventative care: Last colonoscopy: 04/2019, DONE Last EGD: 04/2019  CTA head 04/2018 AAA [place] 01/2019 - followed annually by Dr. Stanford Breed  Prior vaccinations: TD or Tdap: 2014         Influenza: 01/2019        Pneumococcal: 2006 Prevnar13: 2015 Shingles/Zostavax: 2014  Last eye exam: [facility] - 04/18/2019 report abstracted Last dentist exam: Remote - nearly full partials  Patient Care Team: [name], MD as PCP - General (Internal Medicine) Erlene Quan, PA-C as Physician Assistant (Cardiology) Irene Shipper, MD as Consulting Physician (Gastroenterology) Leighton Ruff, MD as Consulting Physician (General Surgery)  Surgical: He  has a past surgical history that includes Knee surgery; Bladder surgery (1969); Coronary angioplasty with stent; Cardiac catheterization (N/A, 07/01/2015); Coronary artery bypass graft (N/A, 07/06/2015); TEE without cardioversion (N/A, 07/06/2015); Colon resection (N/A, 05/17/2017); LOOP RECORDER INSERTION (N/A, 02/19/2018); Esophagogastroduodenoscopy (egd) with propofol (N/A, 06/25/2018); biopsy (06/25/2018); Colonoscopy with propofol (N/A, 04/24/2019); Esophagogastroduodenoscopy (egd) with propofol (N/A, 04/24/2019); Esophageal dilation (04/24/2019); biopsy (04/24/2019); polypectomy (04/24/2019); Submucosal lifting injection (04/24/2019); and Hemostasis clip placement (04/24/2019). Family His family history includes Anuerysm in his brother; Arthritis in his mother; Colon cancer in his sister; Dementia in his mother;  Diabetes in his maternal grandmother; Healthy in his daughter and son; Heart Problems in his brother, brother, brother, and brother; Heart attack in his brother, father, and sister; Liver cancer in his sister. Social history  He reports that he quit smoking about 55 years ago. He quit smokeless tobacco use about 32 years ago.  His smokeless tobacco use included chew. He reports that he does not drink alcohol or use drugs.  MEDICARE WELLNESS OBJECTIVES: Physical activity: Current Exercise Habits: Home exercise routine, Type of exercise: stretching;strength training/weights;Other - see comments(PT), Time (Minutes): 20, Frequency (Times/Week): 7, Weekly Exercise (Minutes/Week): 140, Intensity: Mild, Exercise limited by: orthopedic condition(s) Cardiac risk factors: Cardiac Risk Factors include: advanced age (>44men, >75 women);diabetes mellitus;dyslipidemia;hypertension;male gender;smoking/ tobacco exposure Depression/mood screen:   Depression screen [facility] 2/9 05/29/2019  Decreased Interest 0  Down, Depressed, Hopeless 0  PHQ - 2 Score 0  Altered sleeping 0  Tired, decreased energy 0  Change in appetite 0  Feeling bad  or failure about yourself  0  Trouble concentrating 0  Moving slowly or fidgety/restless 0  Suicidal thoughts 0  PHQ-9 Score 0  Some recent data might be hidden    ADLs:  In your present state of health, do you have any difficulty performing the following activities: 05/29/2019  Hearing? N  Vision? N  Difficulty concentrating or making decisions? Y  Comment some name recall difficulty  Walking or climbing stairs? Y  Comment in walker, scooter at home, chronic back pain doing PT  Dressing or bathing? N  Doing errands, shopping? Y  Comment driven by wife  Conservation officer, nature and eating ? N  Using the Toilet? N  In the past six months, have you accidently leaked urine? N  Do you have problems with loss of bowel control? N  Managing your Medications? N  Managing your Finances? N   Housekeeping or managing your Housekeeping? N  Comment wife manages  Some recent data might be hidden     Cognitive Testing  Alert? Yes  Normal Appearance?Yes  Oriented to person? Yes  Place? Yes   Time? Yes  Recall of three objects?  Yes  Can perform simple calculations? Yes  Displays appropriate judgment?Yes  Can read the correct time from a watch face?Yes  EOL planning: Does Patient Have a Medical Advance Directive?: Yes Type of Advance Directive: Healthcare Power of Attorney, Living will Does patient want to make changes to medical advance directive?: No - Patient declined Copy of [place] in Chart?: No - copy requested   Objective:   Today's Vitals   05/29/19 1458  BP: 140/90  Pulse: 95  Temp: (!) 97.5 F (36.4 C)  SpO2: 97%  Weight: 190 lb (86.2 kg)  PainSc: 4   PainLoc: Knee   Body mass index is 25.77 kg/m.  General appearance: alert, no distress, WD/WN, male HEENT: normocephalic, sclerae anicteric, TMs pearly, nares patent, no discharge or erythema, pharynx normal Oral cavity: MMM, no lesions Neck: supple, no lymphadenopathy, no thyromegaly, no masses Heart: RRR, normal S1, S2, no murmurs Lungs: CTA bilaterally, no wheezes, rhonchi, or rales Abdomen: +bs, soft, non tender, non distended, no masses, no hepatomegaly, no splenomegaly Musculoskeletal: nontender, no swelling, no obvious deformity Extremities: no edema, no cyanosis, no clubbing Pulses: 1+ symmetric, upper and lower extremities, normal cap refill Neurological: alert, oriented x 3, CN2-12 intact, strength normal upper extremities, R quads with 4/5, otherwise symmetrical, sensation significantly decreased to monofilament testing bilaterally senses 2-3/10 points, sitting to standing slow, needs assistance, in wheelchair today Psychiatric: normal affect, behavior normal, pleasant   Medicare Attestation I have personally reviewed: The patient's medical and social history Their use  of alcohol, tobacco or illicit drugs Their current medications and supplements The patient's functional ability including ADLs,fall risks, home safety risks, cognitive, and hearing and visual impairment Diet and physical activities Evidence for depression or mood disorders  The patient's weight, height, BMI, and visual acuity have been recorded in the chart.  I have made referrals, counseling, and provided education to the patient based on review of the above and I have provided the patient with a written personalized care plan for preventive services.     Izora Ribas, NP   05/29/2019

## 2019-05-29 ENCOUNTER — Encounter: Payer: Self-pay | Admitting: Adult Health

## 2019-05-29 ENCOUNTER — Ambulatory Visit (INDEPENDENT_AMBULATORY_CARE_PROVIDER_SITE_OTHER): Payer: Medicare Other | Admitting: Physical Therapy

## 2019-05-29 ENCOUNTER — Other Ambulatory Visit: Payer: Self-pay

## 2019-05-29 ENCOUNTER — Ambulatory Visit: Payer: Medicare Other | Admitting: Adult Health

## 2019-05-29 VITALS — BP 140/90 | HR 95 | Temp 97.5°F | Wt 190.0 lb

## 2019-05-29 DIAGNOSIS — R2689 Other abnormalities of gait and mobility: Secondary | ICD-10-CM | POA: Diagnosis not present

## 2019-05-29 DIAGNOSIS — R6889 Other general symptoms and signs: Secondary | ICD-10-CM | POA: Diagnosis not present

## 2019-05-29 DIAGNOSIS — M5441 Lumbago with sciatica, right side: Secondary | ICD-10-CM

## 2019-05-29 DIAGNOSIS — M5136 Other intervertebral disc degeneration, lumbar region: Secondary | ICD-10-CM

## 2019-05-29 DIAGNOSIS — R29818 Other symptoms and signs involving the nervous system: Secondary | ICD-10-CM | POA: Diagnosis not present

## 2019-05-29 DIAGNOSIS — Z79899 Other long term (current) drug therapy: Secondary | ICD-10-CM

## 2019-05-29 DIAGNOSIS — Z0000 Encounter for general adult medical examination without abnormal findings: Secondary | ICD-10-CM

## 2019-05-29 DIAGNOSIS — I714 Abdominal aortic aneurysm, without rupture, unspecified: Secondary | ICD-10-CM

## 2019-05-29 DIAGNOSIS — E119 Type 2 diabetes mellitus without complications: Secondary | ICD-10-CM

## 2019-05-29 DIAGNOSIS — M51369 Other intervertebral disc degeneration, lumbar region without mention of lumbar back pain or lower extremity pain: Secondary | ICD-10-CM

## 2019-05-29 DIAGNOSIS — Z951 Presence of aortocoronary bypass graft: Secondary | ICD-10-CM

## 2019-05-29 DIAGNOSIS — I25110 Atherosclerotic heart disease of native coronary artery with unstable angina pectoris: Secondary | ICD-10-CM

## 2019-05-29 DIAGNOSIS — N183 Chronic kidney disease, stage 3 unspecified: Secondary | ICD-10-CM

## 2019-05-29 DIAGNOSIS — E785 Hyperlipidemia, unspecified: Secondary | ICD-10-CM

## 2019-05-29 DIAGNOSIS — I739 Peripheral vascular disease, unspecified: Secondary | ICD-10-CM

## 2019-05-29 DIAGNOSIS — M6281 Muscle weakness (generalized): Secondary | ICD-10-CM

## 2019-05-29 DIAGNOSIS — I951 Orthostatic hypotension: Secondary | ICD-10-CM

## 2019-05-29 DIAGNOSIS — R001 Bradycardia, unspecified: Secondary | ICD-10-CM

## 2019-05-29 DIAGNOSIS — E1169 Type 2 diabetes mellitus with other specified complication: Secondary | ICD-10-CM

## 2019-05-29 DIAGNOSIS — E538 Deficiency of other specified B group vitamins: Secondary | ICD-10-CM

## 2019-05-29 DIAGNOSIS — Z6825 Body mass index (BMI) 25.0-25.9, adult: Secondary | ICD-10-CM

## 2019-05-29 DIAGNOSIS — E1122 Type 2 diabetes mellitus with diabetic chronic kidney disease: Secondary | ICD-10-CM

## 2019-05-29 DIAGNOSIS — E1342 Other specified diabetes mellitus with diabetic polyneuropathy: Secondary | ICD-10-CM | POA: Diagnosis not present

## 2019-05-29 DIAGNOSIS — R42 Dizziness and giddiness: Secondary | ICD-10-CM

## 2019-05-29 DIAGNOSIS — Z0001 Encounter for general adult medical examination with abnormal findings: Secondary | ICD-10-CM

## 2019-05-29 DIAGNOSIS — G903 Multi-system degeneration of the autonomic nervous system: Secondary | ICD-10-CM

## 2019-05-29 DIAGNOSIS — K219 Gastro-esophageal reflux disease without esophagitis: Secondary | ICD-10-CM

## 2019-05-29 DIAGNOSIS — Z9181 History of falling: Secondary | ICD-10-CM | POA: Insufficient documentation

## 2019-05-29 DIAGNOSIS — Z8673 Personal history of transient ischemic attack (TIA), and cerebral infarction without residual deficits: Secondary | ICD-10-CM

## 2019-05-29 DIAGNOSIS — E559 Vitamin D deficiency, unspecified: Secondary | ICD-10-CM

## 2019-05-29 DIAGNOSIS — G8929 Other chronic pain: Secondary | ICD-10-CM

## 2019-05-29 DIAGNOSIS — N410 Acute prostatitis: Secondary | ICD-10-CM

## 2019-05-29 MED ORDER — AMOXICILLIN 500 MG PO TABS: 500.0000 mg | ORAL_TABLET | Freq: Three times a day (TID) | ORAL | 0 refills | Status: AC

## 2019-05-29 NOTE — Therapy (Signed)
[facility] [address] [place], [place], [address] Phone: [phone]   Fax:  [phone]  Physical Therapy Treatment  Patient Details  Name: [name] MRN: [id number] Date of Birth: 02-18-1934 Referring Provider (PT): [name], MD   Encounter Date: 05/29/2019  PT End of Session - 05/29/19 1227    Visit Number  6    Number of Visits  12    Date for PT Re-Evaluation  06/11/19    Authorization Type  [employer] MCR    PT Start Time  [id number]    PT Stop Time  1227    PT Time Calculation (min)  42 min    Activity Tolerance  Patient tolerated treatment well    Behavior During Therapy  [facility] for tasks assessed/performed       Past Medical History:  Diagnosis Date  . 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 Colon polyps   . Coronary atherosclerosis of unspecified type of vessel, native or graft   . Diabetes ([place])   . Difficult intubation   . 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 Esophageal reflux   . High cholesterol   . History of IBS 02/27/2009  . Hypertension    pt denies, he says he has a h/o hypotension. If BP up he adjusts the [place]  . Jejunitis with partial SBO 05/20/2017  . Orthostatic hypotension    "BP has been dropping alot when I stand up for the last month or so" (02/17/2016)  . Prostatitis   . Upper GI bleed 06/24/2018  . Vitamin B 12 deficiency   . Vitamin D deficiency     Past Surgical History:  Procedure Laterality Date  . BIOPSY  06/25/2018   Procedure: BIOPSY;  Surgeon: Rush Landmark Telford Nab., MD;  Location: [place];  Service: Gastroenterology;;  . BIOPSY  04/24/2019   Procedure: BIOPSY;  Surgeon: Irving Copas., MD;  Location: [facility];  Service: Gastroenterology;;  . BLADDER SURGERY  1969   traumatic pelvic fractures, urethral and bladder repair  . CARDIAC CATHETERIZATION N/A 07/01/2015   Procedure: Left Heart Cath and Coronary Angiography;  Surgeon: Wellington Hampshire, MD;  Location: [place] [facility];  Service: Cardiovascular;  Laterality: N/A;  . COLON RESECTION N/A 05/17/2017   Procedure: DIAGNOSTIC LAPAROSCOPY,;  Surgeon: Leighton Ruff, MD;  Location: [facility];  Service: General;  Laterality: N/A;  . COLONOSCOPY WITH PROPOFOL N/A 04/24/2019   Procedure: COLONOSCOPY WITH PROPOFOL;  Surgeon: Irving Copas., MD;  Location: [facility];  Service: Gastroenterology;  Laterality: N/A;  . CORONARY ANGIOPLASTY WITH STENT PLACEMENT    . CORONARY ARTERY BYPASS GRAFT N/A 07/06/2015   Procedure: CORONARY ARTERY BYPASS GRAFTING (CABG)x 4   utilizing the left internal mammary artery and endoscopically harvested bilateral  sapheneous vein.;  Surgeon: Ivin Poot, MD;  Location: [place];  Service: Open Heart Surgery;  Laterality: N/A;  . ESOPHAGEAL DILATION  04/24/2019   Procedure: ESOPHAGEAL DILATION;  Surgeon: Rush Landmark Telford Nab., MD;  Location: [facility];  Service: Gastroenterology;;  . ESOPHAGOGASTRODUODENOSCOPY (EGD) WITH PROPOFOL N/A 06/25/2018   Procedure: ESOPHAGOGASTRODUODENOSCOPY (EGD) WITH PROPOFOL;  Surgeon: Irving Copas., MD;  Location: [place];  Service: Gastroenterology;  Laterality: N/A;  . ESOPHAGOGASTRODUODENOSCOPY (EGD) WITH PROPOFOL N/A 04/24/2019   Procedure: ESOPHAGOGASTRODUODENOSCOPY (EGD) WITH PROPOFOL;  Surgeon: Rush Landmark Telford Nab., MD;  Location: [facility];  Service: Gastroenterology;  Laterality: N/A;  . HEMOSTASIS CLIP PLACEMENT  04/24/2019   Procedure: HEMOSTASIS CLIP PLACEMENT;  Surgeon: Justice Britain  Brooke Bonito., MD;  Location: [facility];  Service: Gastroenterology;;  . KNEE SURGERY    . LOOP RECORDER INSERTION N/A 02/19/2018   Procedure: LOOP RECORDER INSERTION;  Surgeon: Deboraha Sprang, MD;  Location: [place] [facility];  Service: Cardiovascular;  Laterality: N/A;  . POLYPECTOMY  04/24/2019   Procedure: POLYPECTOMY;  Surgeon: Rush Landmark Telford Nab., MD;  Location: [facility];  Service: Gastroenterology;;  . Lia Foyer LIFTING  INJECTION  04/24/2019   Procedure: SUBMUCOSAL LIFTING INJECTION;  Surgeon: Irving Copas., MD;  Location: [facility];  Service: Gastroenterology;;  . TEE WITHOUT CARDIOVERSION N/A 07/06/2015   Procedure: TRANSESOPHAGEAL ECHOCARDIOGRAM (TEE);  Surgeon: Ivin Poot, MD;  Location: [place];  Service: Open Heart Surgery;  Laterality: N/A;    There were no vitals filed for this visit.  Subjective Assessment - 05/29/19 1208    Subjective  My legs were really sore after last time, I think we need to back it down some.    Pertinent History  PMH: CVA X5, DM, CKD, CAD, CABGX4,    Limitations  Lifting;Standing;Walking;House hold activities    How long can you stand comfortably?  2 min    How long can you walk comfortably?  household distances    Diagnostic tests  Imaging shows "Spinal stenosis of lumbar region with neurogenic claudication, advanced lumbar DDD, Scoliosis"    Patient Stated Goals  stand and walk better, improve leg strength and pain    Pain Onset  More than a month ago                       [facility] Adult PT Treatment/Exercise - 05/29/19 0001      Ambulation/Gait   Ambulation/Gait  Yes    Ambulation/Gait Assistance  5: Supervision    Ambulation Distance (Feet)  250 Feet    Assistive device  Rollator    Gait velocity  slow      Lumbar Exercises: Aerobic   Nustep  7 min L5 UE/LE for endurance and leg strength      Knee/Hip Exercises: Standing   Other Standing Knee Exercises  sidestepping at counter top with UE support up/down X 3 reps with L3 band around his knees      Knee/Hip Exercises: Seated   Long Arc Quad  Both;2 sets;10 reps    Long Arc Quad Weight  2 lbs.    Clamshell with TheraBand  Green   20 reps   Marching  Both;2 sets;10 reps    Marching Weights  2 lbs.    Hamstring Curl  Both;15 reps    Hamstring Limitations  L3 band    Sit to Sand  2 sets;5 reps;with UE support   from rollator                 PT Long Term Goals -  05/29/19 1229      PT LONG TERM GOAL #1   Title  independent with updated HEP (Target all LTGs 06/11/2019)    Time  6    Period  Weeks    Status  On-going      PT LONG TERM GOAL #2   Title  Improve lumbar ROM to [facility] in standing (at least 60%).    Time  6    Period  Weeks    Status  On-going      PT LONG TERM GOAL #3   Title  Improve bilat leg strength to at least 4+/5 MMT measured in sitting to  improve function    Time  6    Period  Weeks    Status  On-going      PT LONG TERM GOAL #4   Title  Will improve 5TSTS to <25 sec to show improved leg strength and endurance    Baseline  improved to 29 sec last visit    Time  6    Period  Weeks    Status  On-going      PT LONG TERM GOAL #5   Title  Pt will be albe to stand and ambulate at least 10-15 minutes or 300 ft with LRAD to show improved ability for limited community ambulation    Time  6    Period  Weeks    Status  On-going            Plan - 05/29/19 1227    Clinical Impression Statement  He relays he was really sore after last session so backed down the intensity some for his leg strength program. PT is noticing that he is picking up his Rt leg more during gait and with transfers and is no longer relying on assisting his Rt leg with his hands. Pt will continue to work on gait, standing tolerance and leg strength while monitoring his soreness.    Personal Factors and Comorbidities  Comorbidity 3+    Comorbidities  PMH: CVA X5, DM, CKD, CAD, CABGX4,    Examination-Activity Limitations  Bed Mobility;Bend;Carry;Squat;Stairs;Lift;Stand;Transfers;Locomotion Level    Examination-Participation Restrictions  Meal Prep;Cleaning;Community Activity;Shop    Stability/Clinical Decision Making  Evolving/Moderate complexity    Rehab Potential  Fair    PT Frequency  2x / week   1-2   PT Duration  6 weeks    PT Treatment/Interventions  ADLs/Self Care Home Management;Cryotherapy;Iontophoresis 4mg /ml Dexamethasone;Moist Heat;Gait  training;Stair training;Functional mobility training;Therapeutic activities;Therapeutic exercise;Balance training;Neuromuscular re-education;Manual techniques;Passive range of motion;Taping    PT Next Visit Plan  review and update HEP PRN. needs bilat leg strength Rt is weaker than Lt, needs balance, gait with rollator. lumbar stretching    PT Home Exercise Plan  Access Code: [email or web address]    Consulted and Agree with Plan of Care  Patient       Patient will benefit from skilled therapeutic intervention in order to improve the following deficits and impairments:  Abnormal gait, Decreased activity tolerance, Decreased balance, Decreased endurance, Decreased range of motion, Decreased strength, Hypomobility, Difficulty walking, Impaired flexibility, Postural dysfunction, Pain  Visit Diagnosis: Chronic bilateral low back pain with right-sided sciatica  Other abnormalities of gait and mobility  Muscle weakness (generalized)  Other symptoms and signs involving the nervous system  Dizziness and giddiness     Problem List Patient Active Problem List   Diagnosis Date Noted  . Chronic bilateral low back pain without sciatica 02/07/2019  . Statin intolerance 11/16/2018  . Hyperlipidemia, mixed 08/15/2018  . Low back pain 07/18/2018  . History of lacunar cerebrovascular accident (CVA) 02/12/2018  . Dizziness 02/12/2018  . Myofascial pain 12/18/2017  . Spondylosis without myelopathy or radiculopathy, lumbar region 12/18/2017  . Lumbar radiculopathy, right 12/18/2017  . Cholelithiasis 05/17/2017  . Sinus Bradycardia 05/17/2017  . Thoracic radiculopathy 04/12/2017  . Primary osteoarthritis of right knee 12/14/2016  . DDD (degenerative disc disease), lumbar 10/10/2016  . Lumbar facet arthropathy 08/29/2016  . Idiopathic scoliosis 03/22/2016  . Diabetic neuropathy ([place]) 03/22/2016  . Dysautonomia orthostatic hypotension syndrome ([place]) 02/25/2016  . CKD stage 3 due to type 2 diabetes mellitus  ([place]) 02/19/2016  .  Coronary artery disease involving coronary bypass graft of native heart without angina pectoris   . Diabetes mellitus type 2, diet-controlled ([place])   . 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 Medication management 08/11/2015  . 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 PVD (peripheral vascular disease) ([place]) 01/01/2014  . Hyperlipidemia associated with type 2 diabetes mellitus ([place]) 04/11/2013  . Supine hypertension   . Vitamin D deficiency   . Vitamin B 12 deficiency   . Coronary atherosclerosis- s/p PCI to LAD in 2009 and PCI to RCA in 2011 02/27/2009  . Abdominal aortic aneurysm ([place]) 02/27/2009  . GERD 02/27/2009    Silvestre Mesi 05/29/2019, 12:31 PM  [facility] [address] [place], [place], [address] Phone: [phone]   Fax:  [phone]  Name: ENVER MELLER MRN: [id number] Date of Birth: 12/12/1933

## 2019-05-29 NOTE — Patient Instructions (Addendum)
[name] , Thank you for taking time to come for your Medicare Wellness Visit. I appreciate your ongoing commitment to your health goals. Please review the following plan we discussed and let me know if I can assist you in the future.   These are the goals we discussed: Goals    . DIET - INCREASE WATER INTAKE     Work up to 45+ fluid ounces        This is a list of the screening recommended for you and due dates:  Health Maintenance  Topic Date Due  . Flu Shot  12/01/2018  . Hemoglobin A1C  08/21/2019  . Complete foot exam   02/20/2020  . Eye exam for diabetics  04/17/2020  . DEXA scan (bone density measurement)  02/17/2021  . Tetanus Vaccine  10/02/2022  . Pneumonia vaccines  Completed     Can try voltaren/diclofenac gel dime size topically twice daily on rib sore areas     Diclofenac skin gel What is this medicine? DICLOFENAC (dye KLOE fen ak) is a non-steroidal anti-inflammatory drug (NSAID). The 1% skin gel is used to treat osteoarthritis of the hands or knees. The 3% skin gel is used to treat actinic keratosis. This medicine may be used for other purposes; ask your health care provider or pharmacist if you have questions. COMMON BRAND NAME(S): DSG Pak, Omeca, Solaravix, Solaraze, Voltaren Arthritis, Voltaren Gel What should I tell my health care provider before I take this medicine? They need to know if you have any of these conditions:  asthma  bleeding problems  coronary artery bypass graft (CABG) surgery within the past 2 weeks  heart disease  high blood pressure  if you frequently drink alcohol containing drinks  kidney disease  liver disease  open or infected skin  stomach problems  an unusual or allergic reaction to diclofenac, aspirin, other NSAIDs, other medicines, benzyl alcohol (3% gel only), foods, dyes, or preservatives  pregnant or trying to get pregnant  breast-feeding How should I use this medicine? This medicine is for external  use only. Follow the directions on the prescription label. Wash hands before and after use. Do not get this medicine in your eyes. If you do, rinse out with plenty of cool tap water. Use your doses at regular intervals. Do not use your medicine more often than directed. A special MedGuide will be given to you by the pharmacist with each prescription and refill of the 1% gel. Be sure to read this information carefully each time. Talk to your pediatrician regarding the use of this medicine in children. Special care may be needed. The 3% gel is not approved for use in children. Overdosage: If you think you have taken too much of this medicine contact a poison control center or emergency room at once. NOTE: This medicine is only for you. Do not share this medicine with others. What if I miss a dose? If you miss a dose, use it as soon as you can. If it is almost time for your next dose, use only that dose. Do not use double or extra doses. What may interact with this medicine?  aspirin  NSAIDs, medicines for pain and inflammation, like ibuprofen or naproxen Do not use any other skin products without telling your doctor or health care professional. This list may not describe all possible interactions. Give your health care provider a list of all the medicines, herbs, non-prescription drugs, or dietary supplements you use. Also tell them if you smoke,  drink alcohol, or use illegal drugs. Some items may interact with your medicine. What should I watch for while using this medicine? Tell your doctor or healthcare provider if your symptoms do not start to get better or if they get worse. You will need to follow up with your healthcare provider to monitor your progress. You may need to be treated for up to 3 months if you are using the 3% gel, but the full effect may not occur until 1 month after stopping treatment. If you develop a severe skin reaction, contact your doctor or healthcare provider  immediately. This medicine may cause serious skin reactions. They can happen weeks to months after starting the medicine. Contact your healthcare provider right away if you notice fevers or flu-like symptoms with a rash. The rash may be red or purple and then turn into blisters or peeling of the skin. Or, you might notice a red rash with swelling of the face, lips or lymph nodes in your neck or under your arms. This medicine can make you more sensitive to the sun. Keep out of the sun. If you cannot avoid being in the sun, wear protective clothing and use sunscreen. Do not use sun lamps or tanning beds/booths. Do not take medicines such as ibuprofen and naproxen with this medicine. Side effects such as stomach upset, nausea, or ulcers may be more likely to occur. Many medicines available without a prescription should not be taken with this medicine. This medicine does not prevent heart attack or stroke. In fact, this medicine may increase the chance of a heart attack or stroke. The chance may increase with longer use of this medicine and in people who have heart disease. If you take aspirin to prevent heart attack or stroke, talk with your doctor or healthcare provider. This medicine can cause ulcers and bleeding in the stomach and intestines at any time during treatment. Do not smoke cigarettes or drink alcohol. These increase irritation to your stomach and can make it more susceptible to damage from this medicine. Ulcers and bleeding can happen without warning symptoms and can cause death. You may get drowsy or dizzy. Do not drive, use machinery, or do anything that needs mental alertness until you know how this medicine affects you. Do not stand or sit up quickly, especially if you are an older patient. This reduces the risk of dizzy or fainting spells. This medicine can cause you to bleed more easily. Try to avoid damage to your teeth and gums when you brush or floss your teeth. What side effects may I  notice from receiving this medicine? Side effects that you should report to your doctor or health care professional as soon as possible:  allergic reactions like skin rash, itching or hives, swelling of the face, lips, or tongue  black or bloody stools, blood in the urine or vomit  blurred vision  chest pain  difficulty breathing or wheezing  nausea or vomiting  rash, fever, and swollen lymph nodes  redness, blistering, peeling or loosening of the skin, including inside the mouth  slurred speech or weakness on one side of the body  trouble passing urine or change in the amount of urine  unexplained weight gain or swelling  unusually weak or tired  yellowing of eyes or skin Side effects that usually do not require medical attention (report to your doctor or health care professional if they continue or are bothersome):  dizziness  dry skin  headache  heartburn  increased sensitivity  to the sun  stomach pain  tingling at the application site This list may not describe all possible side effects. Call your doctor for medical advice about side effects. You may report side effects to FDA at 1-800-FDA-1088. Where should I keep my medicine? Keep out of the reach of children. Store the 1% gel at room temperature between 15 and 30 degrees C (59 and 86 degrees F). Store the 3% gel at room temperature between 20 and 25 degrees C (68 and 77 degrees F). Protect from light. Throw away any unused medicine after the expiration date. NOTE: This sheet is a summary. It may not cover all possible information. If you have questions about this medicine, talk to your doctor, pharmacist, or health care provider.  2020 Elsevier/Gold Standard (2018-07-04 13:05:18)

## 2019-05-30 ENCOUNTER — Ambulatory Visit: Payer: Medicare Other | Admitting: Rheumatology

## 2019-05-30 LAB — URINALYSIS W MICROSCOPIC + REFLEX CULTURE
Bacteria, UA: NONE SEEN /HPF
Bilirubin Urine: NEGATIVE
Glucose, UA: NEGATIVE
Hgb urine dipstick: NEGATIVE
Hyaline Cast: NONE SEEN /LPF
Ketones, ur: NEGATIVE
Leukocyte Esterase: NEGATIVE
Nitrites, Initial: NEGATIVE
Protein, ur: NEGATIVE
RBC / HPF: NONE SEEN /HPF (ref 0–2)
Specific Gravity, Urine: 1.011 (ref 1.001–1.03)
Squamous Epithelial / HPF: NONE SEEN /HPF (ref <=5)
WBC, UA: NONE SEEN /HPF (ref 0–5)
pH: 6.5 (ref 5.0–8.0)

## 2019-05-30 LAB — CBC WITH DIFFERENTIAL/PLATELET
Absolute Monocytes: 601 cells/uL (ref 200–950)
Basophils Absolute: 47 cells/uL (ref 0–200)
Basophils Relative: 0.6 %
Eosinophils Absolute: 624 cells/uL — ABNORMAL HIGH (ref 15–500)
Eosinophils Relative: 8 %
HCT: 37.3 % — ABNORMAL LOW (ref 38.5–50.0)
Hemoglobin: 12.2 g/dL — ABNORMAL LOW (ref 13.2–17.1)
Lymphs Abs: 2301 cells/uL (ref 850–3900)
MCH: 32.5 pg (ref 27.0–33.0)
MCHC: 32.7 g/dL (ref 32.0–36.0)
MCV: 99.5 fL (ref 80.0–100.0)
MPV: 9.5 fL (ref 7.5–12.5)
Monocytes Relative: 7.7 %
Neutro Abs: 4228 cells/uL (ref 1500–7800)
Neutrophils Relative %: 54.2 %
Platelets: 220 10*3/uL (ref 140–400)
RBC: 3.75 10*6/uL — ABNORMAL LOW (ref 4.20–5.80)
RDW: 12.7 % (ref 11.0–15.0)
Total Lymphocyte: 29.5 %
WBC: 7.8 10*3/uL (ref 3.8–10.8)

## 2019-05-30 LAB — COMPLETE METABOLIC PANEL WITH GFR
AG Ratio: 1.8 (calc) (ref 1.0–2.5)
ALT: 10 U/L (ref 9–46)
AST: 18 U/L (ref 10–35)
Albumin: 3.9 g/dL (ref 3.6–5.1)
Alkaline phosphatase (APISO): 67 U/L (ref 35–144)
BUN/Creatinine Ratio: 14 (calc) (ref 6–22)
BUN: 22 mg/dL (ref 7–25)
CO2: 32 mmol/L (ref 20–32)
Calcium: 9.3 mg/dL (ref 8.6–10.3)
Chloride: 103 mmol/L (ref 98–110)
Creat: 1.54 mg/dL — ABNORMAL HIGH (ref 0.70–1.11)
GFR, Est African American: 48 mL/min/{1.73_m2} — ABNORMAL LOW (ref >=60)
GFR, Est Non African American: 41 mL/min/{1.73_m2} — ABNORMAL LOW (ref >=60)
Globulin: 2.2 g/dL (calc) (ref 1.9–3.7)
Glucose, Bld: 110 mg/dL — ABNORMAL HIGH (ref 65–99)
Potassium: 5 mmol/L (ref 3.5–5.3)
Sodium: 142 mmol/L (ref 135–146)
Total Bilirubin: 0.4 mg/dL (ref 0.2–1.2)
Total Protein: 6.1 g/dL (ref 6.1–8.1)

## 2019-05-30 LAB — LIPID PANEL
Cholesterol: 221 mg/dL — ABNORMAL HIGH (ref <200)
HDL: 32 mg/dL — ABNORMAL LOW (ref >=40)
LDL Cholesterol (Calc): 153 mg/dL (calc) — ABNORMAL HIGH
Non-HDL Cholesterol (Calc): 189 mg/dL (calc) — ABNORMAL HIGH (ref <130)
Total CHOL/HDL Ratio: 6.9 (calc) — ABNORMAL HIGH (ref <5.0)
Triglycerides: 223 mg/dL — ABNORMAL HIGH (ref <150)

## 2019-05-30 LAB — NO CULTURE INDICATED

## 2019-05-30 LAB — HEMOGLOBIN A1C
Hgb A1c MFr Bld: 7.3 % of total Hgb — ABNORMAL HIGH (ref <5.7)
Mean Plasma Glucose: 163 (calc)
eAG (mmol/L): 9 (calc)

## 2019-05-30 LAB — C-REACTIVE PROTEIN: CRP: 22.4 mg/L — ABNORMAL HIGH (ref <8.0)

## 2019-05-30 LAB — MAGNESIUM: Magnesium: 2.4 mg/dL (ref 1.5–2.5)

## 2019-05-30 LAB — TSH: TSH: 4.39 mIU/L (ref 0.40–4.50)

## 2019-06-03 ENCOUNTER — Ambulatory Visit (INDEPENDENT_AMBULATORY_CARE_PROVIDER_SITE_OTHER): Payer: Medicare Other | Admitting: *Deleted

## 2019-06-03 DIAGNOSIS — Z8673 Personal history of transient ischemic attack (TIA), and cerebral infarction without residual deficits: Secondary | ICD-10-CM

## 2019-06-03 LAB — CUP PACEART REMOTE DEVICE CHECK
Date Time Interrogation Session: 20210201003311
Implantable Pulse Generator Implant Date: 20191021

## 2019-06-03 NOTE — Progress Notes (Signed)
ILR Remote 

## 2019-06-11 ENCOUNTER — Ambulatory Visit: Payer: Medicare Other | Attending: Internal Medicine

## 2019-06-11 DIAGNOSIS — Z23 Encounter for immunization: Secondary | ICD-10-CM

## 2019-06-11 NOTE — Progress Notes (Signed)
   Covid-19 Vaccination Clinic  Name:  [name]    MRN: [id number] DOB: Sep 27, 1933  06/11/2019  [name] was observed post Covid-19 immunization for 15 minutes without incidence. He was provided with Vaccine Information Sheet and instruction to access the V-Safe system.   [name] was instructed to call 911 with any severe reactions post vaccine: Marland Kitchen Difficulty breathing  . Swelling of your face and throat  . A fast heartbeat  . A bad rash all over your body  . Dizziness and weakness    Immunizations Administered    Name Date Dose VIS Date Route   Pfizer COVID-19 Vaccine 06/11/2019 12:31 PM 0.3 mL 04/12/2019 Intramuscular   Manufacturer: [place]   Lot: [id number]   [place]: [id number]

## 2019-06-12 ENCOUNTER — Other Ambulatory Visit: Payer: Self-pay

## 2019-06-12 ENCOUNTER — Ambulatory Visit (INDEPENDENT_AMBULATORY_CARE_PROVIDER_SITE_OTHER): Payer: Medicare Other | Admitting: Physical Therapy

## 2019-06-12 DIAGNOSIS — G8929 Other chronic pain: Secondary | ICD-10-CM

## 2019-06-12 DIAGNOSIS — R2689 Other abnormalities of gait and mobility: Secondary | ICD-10-CM | POA: Diagnosis not present

## 2019-06-12 DIAGNOSIS — M5441 Lumbago with sciatica, right side: Secondary | ICD-10-CM

## 2019-06-12 DIAGNOSIS — R42 Dizziness and giddiness: Secondary | ICD-10-CM

## 2019-06-12 DIAGNOSIS — M6281 Muscle weakness (generalized): Secondary | ICD-10-CM

## 2019-06-12 DIAGNOSIS — R29818 Other symptoms and signs involving the nervous system: Secondary | ICD-10-CM | POA: Diagnosis not present

## 2019-06-12 NOTE — Therapy (Signed)
[facility] [address] [place], [place], [address] Phone: [phone]   Fax:  [phone]  Physical Therapy Treatment  Patient Details  Name: [name] MRN: [id number] Date of Birth: 06-Aug-1933 Referring Provider (PT): [name], MD   Encounter Date: 06/12/2019  PT End of Session - 06/12/19 1128    Visit Number  7    Number of Visits  12    Date for PT Re-Evaluation  06/11/19    Authorization Type  UHC MCR    PT Start Time  [id number]    PT Stop Time  1128    PT Time Calculation (min)  43 min    Activity Tolerance  Patient tolerated treatment well    Behavior During Therapy  [facility] for tasks assessed/performed       Past Medical History:  Diagnosis Date  . 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 Colon polyps   . Coronary atherosclerosis of unspecified type of vessel, native or graft   . Diabetes ([place])   . Difficult intubation   . 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 Esophageal reflux   . High cholesterol   . History of IBS 02/27/2009  . Hypertension    pt denies, he says he has a h/o hypotension. If BP up he adjusts the Florinef  . Jejunitis with partial SBO 05/20/2017  . Orthostatic hypotension    "BP has been dropping alot when I stand up for the last month or so" (02/17/2016)  . Prostatitis   . Upper GI bleed 06/24/2018  . Vitamin B 12 deficiency   . Vitamin D deficiency     Past Surgical History:  Procedure Laterality Date  . BIOPSY  06/25/2018   Procedure: BIOPSY;  Surgeon: Rush Landmark Telford Nab., MD;  Location: [place];  Service: Gastroenterology;;  . BIOPSY  04/24/2019   Procedure: BIOPSY;  Surgeon: Irving Copas., MD;  Location: [facility];  Service: Gastroenterology;;  . BLADDER SURGERY  1969   traumatic pelvic fractures, urethral and bladder repair  . CARDIAC CATHETERIZATION N/A 07/01/2015   Procedure: Left Heart Cath and Coronary Angiography;  Surgeon: Wellington Hampshire, MD;  Location: [place] [facility];  Service: Cardiovascular;  Laterality: N/A;  . COLON RESECTION N/A 05/17/2017   Procedure: DIAGNOSTIC LAPAROSCOPY,;  Surgeon: Leighton Ruff, MD;  Location: [facility];  Service: General;  Laterality: N/A;  . COLONOSCOPY WITH PROPOFOL N/A 04/24/2019   Procedure: COLONOSCOPY WITH PROPOFOL;  Surgeon: Irving Copas., MD;  [facility];  Service: Gastroenterology;  Laterality: N/A;  . CORONARY ANGIOPLASTY WITH STENT PLACEMENT    . CORONARY ARTERY BYPASS GRAFT N/A 07/06/2015   Procedure: CORONARY ARTERY BYPASS GRAFTING (CABG)x 4   utilizing the left internal mammary artery and endoscopically harvested bilateral  sapheneous vein.;  Surgeon: Ivin Poot, MD;  Location: [place];  Service: Open Heart Surgery;  Laterality: N/A;  . ESOPHAGEAL DILATION  04/24/2019   Procedure: ESOPHAGEAL DILATION;  Surgeon: Rush Landmark Telford Nab., MD;  [facility];  Service: Gastroenterology;;  . ESOPHAGOGASTRODUODENOSCOPY (EGD) WITH PROPOFOL N/A 06/25/2018   Procedure: ESOPHAGOGASTRODUODENOSCOPY (EGD) WITH PROPOFOL;  Surgeon: Irving Copas., MD;  Location: [place];  Service: Gastroenterology;  Laterality: N/A;  . ESOPHAGOGASTRODUODENOSCOPY (EGD) WITH PROPOFOL N/A 04/24/2019   Procedure: ESOPHAGOGASTRODUODENOSCOPY (EGD) WITH PROPOFOL;  Surgeon: Rush Landmark Telford Nab., MD;  [facility];  Service: Gastroenterology;  Laterality: N/A;  . HEMOSTASIS CLIP PLACEMENT  04/24/2019   Procedure: HEMOSTASIS CLIP PLACEMENT;  Surgeon: Justice Britain  Brooke Bonito., MD;  Location: [facility];  Service: Gastroenterology;;  . KNEE SURGERY    . LOOP RECORDER INSERTION N/A 02/19/2018   Procedure: LOOP RECORDER INSERTION;  Surgeon: Deboraha Sprang, MD;  Location: [place] [facility];  Service: Cardiovascular;  Laterality: N/A;  . POLYPECTOMY  04/24/2019   Procedure: POLYPECTOMY;  Surgeon: Rush Landmark Telford Nab., MD;  Location: [facility];  Service: Gastroenterology;;  . Lia Foyer LIFTING  INJECTION  04/24/2019   Procedure: SUBMUCOSAL LIFTING INJECTION;  Surgeon: Irving Copas., MD;  [facility];  Service: Gastroenterology;;  . TEE WITHOUT CARDIOVERSION N/A 07/06/2015   Procedure: TRANSESOPHAGEAL ECHOCARDIOGRAM (TEE);  Surgeon: Ivin Poot, MD;  Location: [place];  Service: Open Heart Surgery;  Laterality: N/A;    There were no vitals filed for this visit.  Subjective Assessment - 06/12/19 1100    Subjective  I am doing better, my legs are feeling stronger, I am walking a lot more and not using the wheelchair as much.    Pertinent History  PMH: CVA X5, DM, CKD, CAD, CABGX4,    Limitations  Lifting;Standing;Walking;House hold activities    How long can you stand comfortably?  2 min    How long can you walk comfortably?  household distances    Diagnostic tests  Imaging shows "Spinal stenosis of lumbar region with neurogenic claudication, advanced lumbar DDD, Scoliosis"    Patient Stated Goals  stand and walk better, improve leg strength and pain    Currently in Pain?  Yes    Pain Score  3     Pain Location  Back    Pain Onset  More than a month ago                       [facility] Adult PT Treatment/Exercise - 06/12/19 0001      Ambulation/Gait   Ambulation/Gait  Yes    Ambulation/Gait Assistance  5: Supervision    Ambulation Distance (Feet)  --   125X2 with RW     Knee/Hip Exercises: Aerobic   Recumbent Bike  6 min L1      Knee/Hip Exercises: Machines for Strengthening   Cybex Leg Press  75 lbs 2X15      Knee/Hip Exercises: Standing   Other Standing Knee Exercises  in bars step ups onto 4 inch step with bilat UE support X 10 reps on Lt and 5 reps on Rt      Knee/Hip Exercises: Seated   Long Arc Quad  Both    Long Arc Quad Limitations  2.5 lbs  X10 reps then no wt for 10 reps    Marching  Both    Marching Limitations  2.5 lbs X 10 reps then no wt for 10 more reps    Hamstring Curl  Both;15 reps    Hamstring Limitations  L3 band     Sit to [employer]  10 reps;with UE support                  PT Long Term Goals - 05/29/19 1229      PT LONG TERM GOAL #1   Title  independent with updated HEP (Target all LTGs 06/11/2019)    Time  6    Period  Weeks    Status  On-going      PT LONG TERM GOAL #2   Title  Improve lumbar ROM to [facility] in standing (at least 60%).    Time  6  Period  Weeks    Status  On-going      PT LONG TERM GOAL #3   Title  Improve bilat leg strength to at least 4+/5 MMT measured in sitting to improve function    Time  6    Period  Weeks    Status  On-going      PT LONG TERM GOAL #4   Title  Will improve 5TSTS to <25 sec to show improved leg strength and endurance    Baseline  improved to 29 sec last visit    Time  6    Period  Weeks    Status  On-going      PT LONG TERM GOAL #5   Title  Pt will be albe to stand and ambulate at least 10-15 minutes or 300 ft with LRAD to show improved ability for limited community ambulation    Time  6    Period  Weeks    Status  On-going            Plan - 06/12/19 1128    Clinical Impression Statement  He arrives today demostrating improved ambulation tolerance with RW and was able to ambulate into clinic from his car and did not need wheelchair. Hei is overall making steady progress with functional leg strength. PT will continue to progress as able while monitoring his soreness.    Personal Factors and Comorbidities  Comorbidity 3+    Comorbidities  PMH: CVA X5, DM, CKD, CAD, CABGX4,    Examination-Activity Limitations  Bed Mobility;Bend;Carry;Squat;Stairs;Lift;Stand;Transfers;Locomotion Level    Examination-Participation Restrictions  Meal Prep;Cleaning;Community Activity;Shop    Stability/Clinical Decision Making  Evolving/Moderate complexity    Rehab Potential  Fair    PT Frequency  2x / week   1-2   PT Duration  6 weeks    PT Treatment/Interventions  ADLs/Self Care Home Management;Cryotherapy;Iontophoresis 4mg /ml Dexamethasone;Moist  Heat;Gait training;Stair training;Functional mobility training;Therapeutic activities;Therapeutic exercise;Balance training;Neuromuscular re-education;Manual techniques;Passive range of motion;Taping    PT Next Visit Plan  review and update HEP PRN. needs bilat leg strength Rt is weaker than Lt, needs balance, gait with rollator. lumbar stretching    PT Home Exercise Plan  Access Code: [email or web address]    Consulted and Agree with Plan of Care  Patient       Patient will benefit from skilled therapeutic intervention in order to improve the following deficits and impairments:  Abnormal gait, Decreased activity tolerance, Decreased balance, Decreased endurance, Decreased range of motion, Decreased strength, Hypomobility, Difficulty walking, Impaired flexibility, Postural dysfunction, Pain  Visit Diagnosis: Chronic bilateral low back pain with right-sided sciatica  Other abnormalities of gait and mobility  Muscle weakness (generalized)  Other symptoms and signs involving the nervous system  Dizziness and giddiness     Problem List Patient Active Problem List   Diagnosis Date Noted  . At high risk for falls 05/29/2019  . Chronic bilateral low back pain without sciatica 02/07/2019  . Statin intolerance 11/16/2018  . Hyperlipidemia, mixed 08/15/2018  . Low back pain 07/18/2018  . History of lacunar cerebrovascular accident (CVA) 02/12/2018  . Dizziness 02/12/2018  . Myofascial pain 12/18/2017  . Spondylosis without myelopathy or radiculopathy, lumbar region 12/18/2017  . Lumbar radiculopathy, right 12/18/2017  . Cholelithiasis 05/17/2017  . Sinus Bradycardia 05/17/2017  . Thoracic radiculopathy 04/12/2017  . Primary osteoarthritis of right knee 12/14/2016  . DDD (degenerative disc disease), lumbar 10/10/2016  . Lumbar facet arthropathy 08/29/2016  . Idiopathic scoliosis 03/22/2016  . Diabetic neuropathy ([place]) 03/22/2016  .  Dysautonomia orthostatic hypotension syndrome ([place]) 02/25/2016   . CKD stage 3 due to type 2 diabetes mellitus ([place]) 02/19/2016  . Coronary artery disease involving coronary bypass graft of native heart without angina pectoris   . Diabetes mellitus type 2, diet-controlled ([place])   . 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 Medication management 08/11/2015  . 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 PVD (peripheral vascular disease) ([place]) 01/01/2014  . Hyperlipidemia associated with type 2 diabetes mellitus ([place]) 04/11/2013  . Supine hypertension   . Vitamin D deficiency   . Vitamin B 12 deficiency   . Coronary atherosclerosis- s/p PCI to LAD in 2009 and PCI to RCA in 2011 02/27/2009  . Abdominal aortic aneurysm ([place]) 02/27/2009  . GERD 02/27/2009    Silvestre Mesi 06/12/2019, 11:30 AM  [facility] [address] [place], [place], [address] Phone: [phone]   Fax:  [phone]  Name: [name] MRN: [id number] Date of Birth: May 27, 1933

## 2019-06-14 ENCOUNTER — Ambulatory Visit (INDEPENDENT_AMBULATORY_CARE_PROVIDER_SITE_OTHER): Payer: Medicare Other | Admitting: Physical Therapy

## 2019-06-14 ENCOUNTER — Encounter: Payer: Self-pay | Admitting: Physical Therapy

## 2019-06-14 ENCOUNTER — Other Ambulatory Visit: Payer: Self-pay

## 2019-06-14 DIAGNOSIS — R2689 Other abnormalities of gait and mobility: Secondary | ICD-10-CM | POA: Diagnosis not present

## 2019-06-14 DIAGNOSIS — M6281 Muscle weakness (generalized): Secondary | ICD-10-CM

## 2019-06-14 DIAGNOSIS — M5441 Lumbago with sciatica, right side: Secondary | ICD-10-CM | POA: Diagnosis not present

## 2019-06-14 DIAGNOSIS — R29818 Other symptoms and signs involving the nervous system: Secondary | ICD-10-CM | POA: Diagnosis not present

## 2019-06-14 DIAGNOSIS — R42 Dizziness and giddiness: Secondary | ICD-10-CM

## 2019-06-14 DIAGNOSIS — G8929 Other chronic pain: Secondary | ICD-10-CM

## 2019-06-14 NOTE — Therapy (Signed)
[facility] [address] [place], [place], [address] Phone: [phone]   Fax:  [phone]  Physical Therapy Treatment  Patient Details  Name: [name] MRN: [id number] Date of Birth: August 12, 1933 Referring Provider (PT): [name], MD   Encounter Date: 06/14/2019  PT End of Session - 06/14/19 0957    Visit Number  8    Number of Visits  12    Date for PT Re-Evaluation  06/11/19    Authorization Type  UHC MCR    PT Start Time  1005    PT Stop Time  1050    PT Time Calculation (min)  45 min    Activity Tolerance  Patient tolerated treatment well    Behavior During Therapy  [facility] for tasks assessed/performed       Past Medical History:  Diagnosis Date  . 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 Colon polyps   . Coronary atherosclerosis of unspecified type of vessel, native or graft   . Diabetes ([place])   . Difficult intubation   . 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 Esophageal reflux   . High cholesterol   . History of IBS 02/27/2009  . Hypertension    pt denies, he says he has a h/o hypotension. If BP up he adjusts the Florinef  . Jejunitis with partial SBO 05/20/2017  . Orthostatic hypotension    "BP has been dropping alot when I stand up for the last month or so" (02/17/2016)  . Prostatitis   . Upper GI bleed 06/24/2018  . Vitamin B 12 deficiency   . Vitamin D deficiency     Past Surgical History:  Procedure Laterality Date  . BIOPSY  06/25/2018   Procedure: BIOPSY;  Surgeon: Rush Landmark Telford Nab., MD;  Location: [place];  Service: Gastroenterology;;  . BIOPSY  04/24/2019   Procedure: BIOPSY;  Surgeon: Irving Copas., MD;  Location: [facility];  Service: Gastroenterology;;  . BLADDER SURGERY  1969   traumatic pelvic fractures, urethral and bladder repair  . CARDIAC CATHETERIZATION N/A 07/01/2015   Procedure: Left Heart Cath and Coronary Angiography;  Surgeon: Wellington Hampshire, MD;  Location: [place] [facility];  Service: Cardiovascular;  Laterality: N/A;  . COLON RESECTION N/A 05/17/2017   Procedure: DIAGNOSTIC LAPAROSCOPY,;  Surgeon: Leighton Ruff, MD;  [facility]: [facility];  Service: General;  Laterality: N/A;  . COLONOSCOPY WITH PROPOFOL N/A 04/24/2019   Procedure: COLONOSCOPY WITH PROPOFOL;  Surgeon: Irving Copas., MD;  [facility];  Service: Gastroenterology;  Laterality: N/A;  . CORONARY ANGIOPLASTY WITH STENT PLACEMENT    . CORONARY ARTERY BYPASS GRAFT N/A 07/06/2015   Procedure: CORONARY ARTERY BYPASS GRAFTING (CABG)x 4   utilizing the left internal mammary artery and endoscopically harvested bilateral  sapheneous vein.;  Surgeon: Ivin Poot, MD;  Location: [place];  Service: Open Heart Surgery;  Laterality: N/A;  . ESOPHAGEAL DILATION  04/24/2019   Procedure: ESOPHAGEAL DILATION;  Surgeon: Rush Landmark Telford Nab., MD;  [facility];  Service: Gastroenterology;;  . ESOPHAGOGASTRODUODENOSCOPY (EGD) WITH PROPOFOL N/A 06/25/2018   Procedure: ESOPHAGOGASTRODUODENOSCOPY (EGD) WITH PROPOFOL;  Surgeon: Irving Copas., MD;  Location: [place];  Service: Gastroenterology;  Laterality: N/A;  . ESOPHAGOGASTRODUODENOSCOPY (EGD) WITH PROPOFOL N/A 04/24/2019   Procedure: ESOPHAGOGASTRODUODENOSCOPY (EGD) WITH PROPOFOL;  Surgeon: Rush Landmark Telford Nab., MD;  [facility];  Service: Gastroenterology;  Laterality: N/A;  . HEMOSTASIS CLIP PLACEMENT  04/24/2019   Procedure: HEMOSTASIS CLIP PLACEMENT;  Surgeon: Justice Britain  Brooke Bonito., MD;  Location: [facility];  Service: Gastroenterology;;  . KNEE SURGERY    . LOOP RECORDER INSERTION N/A 02/19/2018   Procedure: LOOP RECORDER INSERTION;  Surgeon: Deboraha Sprang, MD;  Location: [place] [facility];  Service: Cardiovascular;  Laterality: N/A;  . POLYPECTOMY  04/24/2019   Procedure: POLYPECTOMY;  Surgeon: Rush Landmark Telford Nab., MD;  Location: [facility];  Service: Gastroenterology;;  . Lia Foyer LIFTING  INJECTION  04/24/2019   Procedure: SUBMUCOSAL LIFTING INJECTION;  Surgeon: Irving Copas., MD;  [facility];  Service: Gastroenterology;;  . TEE WITHOUT CARDIOVERSION N/A 07/06/2015   Procedure: TRANSESOPHAGEAL ECHOCARDIOGRAM (TEE);  Surgeon: Ivin Poot, MD;  Location: [place];  Service: Open Heart Surgery;  Laterality: N/A;    There were no vitals filed for this visit.  Subjective Assessment - 06/14/19 0955    Subjective  Pt arriving reporting he has been walking more around his home. Pt arriving today using his rolling walker. Pt stated he feels fatigued after having his 2nd COVID vaccine on Tuesday.    Pertinent History  PMH: CVA X5, DM, CKD, CAD, CABGX4,    Limitations  Lifting;Standing;Walking;House hold activities    Diagnostic tests  Imaging shows "Spinal stenosis of lumbar region with neurogenic claudication, advanced lumbar DDD, Scoliosis"    Patient Stated Goals  stand and walk better, improve leg strength and pain    Currently in Pain?  Yes    Pain Score  3     Pain Location  Back    Pain Orientation  Lower    Pain Descriptors / Indicators  Aching    Pain Type  Chronic pain    Pain Onset  More than a month ago                       [facility] Adult PT Treatment/Exercise - 06/14/19 0001      Lumbar Exercises: Aerobic   Nustep  6 minutes L5 UE and LE   endurance and strength     Knee/Hip Exercises: Machines for Strengthening   Cybex Leg Press  75 lbs 2X15      Knee/Hip Exercises: Standing   Heel Raises  Both;15 reps;2 seconds;Limitations    Heel Raises Limitations  UE support    Hip Flexion  AROM;Stengthening;Both;15 reps;Limitations    Hip Flexion Limitations  UE support    Hip Abduction  Stengthening;10 reps;Knee straight    Abduction Limitations  UE support     Forward Step Up  Right;Left;2 sets;10 reps;Hand Hold: 2;Step Height: 4";Limitations    Other Standing Knee Exercises  standing on Airex mat 10-15 seconds with no UE support  with close supervision in parallel bars    Other Standing Knee Exercises  side stepping in parallel bars using hand held assistance      Knee/Hip Exercises: Seated   Long Arc Quad  Both    Long Arc Quad Limitations  2.5 lbs  X10 reps then no wt for 10 reps    Marching  Both    Marching Limitations  15 reps    Sit to [employer]  10 reps;with UE support                  PT Long Term Goals - 06/14/19 0957      PT LONG TERM GOAL #1   Title  independent with updated HEP (Target all LTGs 06/11/2019)    Time  6    Period  Weeks  Status  On-going      PT LONG TERM GOAL #2   Title  Improve lumbar ROM to [facility] in standing (at least 60%).    Baseline  04/24/18   72  06/07/18 52 (20 point decr)    Time  6    Period  Weeks    Status  On-going      PT LONG TERM GOAL #3   Title  Improve bilat leg strength to at least 4+/5 MMT measured in sitting to improve function    Baseline  06/07/18 2.81 ft/sec    Time  6    Period  Weeks    Status  On-going      PT LONG TERM GOAL #4   Title  Will improve 5TSTS to <25 sec to show improved leg strength and endurance    Baseline  improved to 29 sec last visit    Status  On-going      PT LONG TERM GOAL #5   Title  Pt will be albe to stand and ambulate at least 10-15 minutes or 300 ft with LRAD to show improved ability for limited community ambulation    Time  6    Period  Weeks    Status  On-going            Plan - 06/14/19 1037    Clinical Impression Statement  Pt arriving to therapy today amb with his rolling walker. Pt reporting fatigue and required several rest breaks. Continue to progress with strengthening and balance as pt tolerates.    Personal Factors and Comorbidities  Comorbidity 3+    Comorbidities  PMH: CVA X5, DM, CKD, CAD, CABGX4,    Examination-Activity Limitations  Bed Mobility;Bend;Carry;Squat;Stairs;Lift;Stand;Transfers;Locomotion Level    Examination-Participation Restrictions  Meal Prep;Cleaning;Community Activity;Shop     Stability/Clinical Decision Making  Evolving/Moderate complexity    PT Frequency  2x / week    PT Duration  6 weeks    PT Treatment/Interventions  ADLs/Self Care Home Management;Cryotherapy;Iontophoresis 4mg /ml Dexamethasone;Moist Heat;Gait training;Stair training;Functional mobility training;Therapeutic activities;Therapeutic exercise;Balance training;Neuromuscular re-education;Manual techniques;Passive range of motion;Taping    PT Next Visit Plan  review and update HEP PRN. needs bilat leg strength Rt is weaker than Lt, needs balance, gait with rollator. lumbar stretching    PT Home Exercise Plan  Access Code: [email or web address]    Consulted and Agree with Plan of Care  Patient       Patient will benefit from skilled therapeutic intervention in order to improve the following deficits and impairments:  Abnormal gait, Decreased activity tolerance, Decreased balance, Decreased endurance, Decreased range of motion, Decreased strength, Hypomobility, Difficulty walking, Impaired flexibility, Postural dysfunction, Pain  Visit Diagnosis: Other abnormalities of gait and mobility  Chronic bilateral low back pain with right-sided sciatica  Muscle weakness (generalized)  Other symptoms and signs involving the nervous system  Dizziness and giddiness     Problem List Patient Active Problem List   Diagnosis Date Noted  . At high risk for falls 05/29/2019  . Chronic bilateral low back pain without sciatica 02/07/2019  . Statin intolerance 11/16/2018  . Hyperlipidemia, mixed 08/15/2018  . Low back pain 07/18/2018  . History of lacunar cerebrovascular accident (CVA) 02/12/2018  . Dizziness 02/12/2018  . Myofascial pain 12/18/2017  . Spondylosis without myelopathy or radiculopathy, lumbar region 12/18/2017  . Lumbar radiculopathy, right 12/18/2017  . Cholelithiasis 05/17/2017  . Sinus Bradycardia 05/17/2017  . Thoracic radiculopathy 04/12/2017  . Primary osteoarthritis of right knee 12/14/2016  .  DDD (  degenerative disc disease), lumbar 10/10/2016  . Lumbar facet arthropathy 08/29/2016  . Idiopathic scoliosis 03/22/2016  . Diabetic neuropathy ([place]) 03/22/2016  . Dysautonomia orthostatic hypotension syndrome ([place]) 02/25/2016  . CKD stage 3 due to type 2 diabetes mellitus ([place]) 02/19/2016  . Coronary artery disease involving coronary bypass graft of native heart without angina pectoris   . Diabetes mellitus type 2, diet-controlled ([place])   . 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 Medication management 08/11/2015  . 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 PVD (peripheral vascular disease) ([place]) 01/01/2014  . Hyperlipidemia associated with type 2 diabetes mellitus ([place]) 04/11/2013  . Supine hypertension   . Vitamin D deficiency   . Vitamin B 12 deficiency   . Coronary atherosclerosis- s/p PCI to LAD in 2009 and PCI to RCA in 2011 02/27/2009  . Abdominal aortic aneurysm ([place]) 02/27/2009  . GERD 02/27/2009    Oretha Caprice, PT 06/14/2019, [facility] [address] [place], [place], [address] Phone: [phone]   Fax:  [phone]  Name: [name] MRN: [id number] Date of Birth: Sep 02, 1933

## 2019-06-17 ENCOUNTER — Encounter: Payer: Medicare Other | Admitting: Physical Therapy

## 2019-06-17 ENCOUNTER — Other Ambulatory Visit: Payer: Self-pay

## 2019-06-17 ENCOUNTER — Ambulatory Visit (INDEPENDENT_AMBULATORY_CARE_PROVIDER_SITE_OTHER): Payer: Medicare Other | Admitting: Physical Therapy

## 2019-06-17 DIAGNOSIS — M6281 Muscle weakness (generalized): Secondary | ICD-10-CM | POA: Diagnosis not present

## 2019-06-17 DIAGNOSIS — M5441 Lumbago with sciatica, right side: Secondary | ICD-10-CM | POA: Diagnosis not present

## 2019-06-17 DIAGNOSIS — R2689 Other abnormalities of gait and mobility: Secondary | ICD-10-CM | POA: Diagnosis not present

## 2019-06-17 DIAGNOSIS — R29818 Other symptoms and signs involving the nervous system: Secondary | ICD-10-CM

## 2019-06-17 DIAGNOSIS — G8929 Other chronic pain: Secondary | ICD-10-CM

## 2019-06-17 DIAGNOSIS — R42 Dizziness and giddiness: Secondary | ICD-10-CM

## 2019-06-17 NOTE — Therapy (Signed)
[facility] Physical Therapy [address] [place], [place], [address] Phone: [phone]   Fax:  [phone]  Physical Therapy Treatment  Patient Details  Name: [name] MRN: [id number] Date of Birth: June 18, 1933 Referring Provider (PT): [name], MD   Encounter Date: 06/17/2019  PT End of Session - 06/17/19 1410    Visit Number  9    Number of Visits  12    Date for PT Re-Evaluation  06/11/19    Authorization Type  UHC MCR    PT Start Time  1315    PT Stop Time  1400    PT Time Calculation (min)  45 min    Activity Tolerance  Patient tolerated treatment well    Behavior During Therapy  [facility] for tasks assessed/performed       Past Medical History:  Diagnosis Date  . 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 Colon polyps   . Coronary atherosclerosis of unspecified type of vessel, native or graft   . Diabetes ([place])   . Difficult intubation   . 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 Esophageal reflux   . High cholesterol   . History of IBS 02/27/2009  . Hypertension    pt denies, he says he has a h/o hypotension. If BP up he adjusts the Florinef  . Jejunitis with partial SBO 05/20/2017  . Orthostatic hypotension    "BP has been dropping alot when I stand up for the last month or so" (02/17/2016)  . Prostatitis   . Upper GI bleed 06/24/2018  . Vitamin B 12 deficiency   . Vitamin D deficiency     Past Surgical History:  Procedure Laterality Date  . BIOPSY  06/25/2018   Procedure: BIOPSY;  Surgeon: Rush Landmark Telford Nab., MD;  Location: [place];  Service: Gastroenterology;;  . BIOPSY  04/24/2019   Procedure: BIOPSY;  Surgeon: Irving Copas., MD;  Location: [facility];  Service: Gastroenterology;;  . BLADDER SURGERY  1969   traumatic pelvic fractures, urethral and bladder repair  . CARDIAC CATHETERIZATION N/A 07/01/2015   Procedure: Left Heart Cath and Coronary Angiography;  Surgeon: Wellington Hampshire, MD;  Location: [place] [facility];  Service: Cardiovascular;  Laterality: N/A;  . COLON RESECTION N/A 05/17/2017   Procedure: DIAGNOSTIC LAPAROSCOPY,;  Surgeon: Leighton Ruff, MD;  [facility]: [facility];  Service: General;  Laterality: N/A;  . COLONOSCOPY WITH PROPOFOL N/A 04/24/2019   Procedure: COLONOSCOPY WITH PROPOFOL;  Surgeon: Irving Copas., MD;  [facility];  Service: Gastroenterology;  Laterality: N/A;  . CORONARY ANGIOPLASTY WITH STENT PLACEMENT    . CORONARY ARTERY BYPASS GRAFT N/A 07/06/2015   Procedure: CORONARY ARTERY BYPASS GRAFTING (CABG)x 4   utilizing the left internal mammary artery and endoscopically harvested bilateral  sapheneous vein.;  Surgeon: Ivin Poot, MD;  Location: [place];  Service: Open Heart Surgery;  Laterality: N/A;  . ESOPHAGEAL DILATION  04/24/2019   Procedure: ESOPHAGEAL DILATION;  Surgeon: Rush Landmark Telford Nab., MD;  [facility];  Service: Gastroenterology;;  . ESOPHAGOGASTRODUODENOSCOPY (EGD) WITH PROPOFOL N/A 06/25/2018   Procedure: ESOPHAGOGASTRODUODENOSCOPY (EGD) WITH PROPOFOL;  Surgeon: Irving Copas., MD;  Location: [place];  Service: Gastroenterology;  Laterality: N/A;  . ESOPHAGOGASTRODUODENOSCOPY (EGD) WITH PROPOFOL N/A 04/24/2019   Procedure: ESOPHAGOGASTRODUODENOSCOPY (EGD) WITH PROPOFOL;  Surgeon: Rush Landmark Telford Nab., MD;  [facility];  Service: Gastroenterology;  Laterality: N/A;  . HEMOSTASIS CLIP PLACEMENT  04/24/2019   Procedure: HEMOSTASIS CLIP PLACEMENT;  Surgeon: Justice Britain  Brooke Bonito., MD;  Location: [facility];  Service: Gastroenterology;;  . KNEE SURGERY    . LOOP RECORDER INSERTION N/A 02/19/2018   Procedure: LOOP RECORDER INSERTION;  Surgeon: Deboraha Sprang, MD;  Location: [place] [facility];  Service: Cardiovascular;  Laterality: N/A;  . POLYPECTOMY  04/24/2019   Procedure: POLYPECTOMY;  Surgeon: Rush Landmark Telford Nab., MD;  Location: [facility];  Service: Gastroenterology;;  . Lia Foyer LIFTING  INJECTION  04/24/2019   Procedure: SUBMUCOSAL LIFTING INJECTION;  Surgeon: Irving Copas., MD;  [facility];  Service: Gastroenterology;;  . TEE WITHOUT CARDIOVERSION N/A 07/06/2015   Procedure: TRANSESOPHAGEAL ECHOCARDIOGRAM (TEE);  Surgeon: Ivin Poot, MD;  Location: [place];  Service: Open Heart Surgery;  Laterality: N/A;    There were no vitals filed for this visit.  Subjective Assessment - 06/17/19 1404    Subjective  Relays his legs feel sore and weak, his Rt hip has been bothering him more. He lost power over the weekend so has been cold    Pertinent History  PMH: CVA X5, DM, CKD, CAD, CABGX4,    Limitations  Lifting;Standing;Walking;House hold activities    How long can you stand comfortably?  2 min    How long can you walk comfortably?  household distances    Diagnostic tests  Imaging shows "Spinal stenosis of lumbar region with neurogenic claudication, advanced lumbar DDD, Scoliosis"    Patient Stated Goals  stand and walk better, improve leg strength and pain    Pain Onset  More than a month ago          [facility] Adult PT Treatment/Exercise - 06/17/19 0001      Ambulation/Gait   Ambulation/Gait  Yes    Ambulation/Gait Assistance  5: Supervision    Ambulation Distance (Feet)  250 Feet   125 X2   Assistive device  Rolling walker      Lumbar Exercises: Aerobic   Nustep  6 minutes L6 UE and LE      Knee/Hip Exercises: Machines for Strengthening   Cybex Leg Press  75 lbs 2X15      Knee/Hip Exercises: Standing   Heel Raises  Both;15 reps;2 seconds;Limitations    Heel Raises Limitations  UE support    Other Standing Knee Exercises  standing on [employer] 20 seconds X3 with no UE support with close supervision in parallel bars    Other Standing Knee Exercises  side stepping and marching in parallel bars using hand held assistance up/down X 3 reps      Knee/Hip Exercises: Seated   Long Arc Quad  Both    Long Arc Quad Limitations  2.5 lbs  X10 reps then no  wt for 10 reps    Marching  Both    Marching Limitations  2.5 lbs X 10 reps, then no wt X 10 reps    Sit to [employer]  with UE support;2 sets;5 reps       PT Long Term Goals - 06/14/19 0957      PT LONG TERM GOAL #1   Title  independent with updated HEP (Target all LTGs 06/11/2019)    Time  6    Period  Weeks    Status  On-going      PT LONG TERM GOAL #2   Title  Improve lumbar ROM to [facility] in standing (at least 60%).    Baseline  04/24/18   72  06/07/18 52 (20 point decr)    Time  6  Period  Weeks    Status  On-going      PT LONG TERM GOAL #3   Title  Improve bilat leg strength to at least 4+/5 MMT measured in sitting to improve function    Baseline  06/07/18 2.81 ft/sec    Time  6    Period  Weeks    Status  On-going      PT LONG TERM GOAL #4   Title  Will improve 5TSTS to <25 sec to show improved leg strength and endurance    Baseline  improved to 29 sec last visit    Status  On-going      PT LONG TERM GOAL #5   Title  Pt will be albe to stand and ambulate at least 10-15 minutes or 300 ft with LRAD to show improved ability for limited community ambulation    Time  6    Period  Weeks    Status  On-going            Plan - 06/17/19 1411    Clinical Impression Statement  Session continued to focus on leg strength, standing tolerance, ambulation and balance as tolerated. He will need recet and MCR progress note next visit.    Personal Factors and Comorbidities  Comorbidity 3+    Comorbidities  PMH: CVA X5, DM, CKD, CAD, CABGX4,    Examination-Activity Limitations  Bed Mobility;Bend;Carry;Squat;Stairs;Lift;Stand;Transfers;Locomotion Level    Examination-Participation Restrictions  Meal Prep;Cleaning;Community Activity;Shop    Stability/Clinical Decision Making  Evolving/Moderate complexity    PT Frequency  2x / week    PT Duration  6 weeks    PT Treatment/Interventions  ADLs/Self Care Home Management;Cryotherapy;Iontophoresis 4mg /ml Dexamethasone;Moist Heat;Gait  training;Stair training;Functional mobility training;Therapeutic activities;Therapeutic exercise;Balance training;Neuromuscular re-education;Manual techniques;Passive range of motion;Taping    PT Next Visit Plan  review and update HEP PRN. needs bilat leg strength Rt is weaker than Lt, needs balance, gait with rollator. lumbar stretching    PT Home Exercise Plan  Access Code: [email or web address]    Consulted and Agree with Plan of Care  Patient       Patient will benefit from skilled therapeutic intervention in order to improve the following deficits and impairments:  Abnormal gait, Decreased activity tolerance, Decreased balance, Decreased endurance, Decreased range of motion, Decreased strength, Hypomobility, Difficulty walking, Impaired flexibility, Postural dysfunction, Pain  Visit Diagnosis: Other abnormalities of gait and mobility  Chronic bilateral low back pain with right-sided sciatica  Muscle weakness (generalized)  Other symptoms and signs involving the nervous system  Dizziness and giddiness     Problem List Patient Active Problem List   Diagnosis Date Noted  . At high risk for falls 05/29/2019  . Chronic bilateral low back pain without sciatica 02/07/2019  . Statin intolerance 11/16/2018  . Hyperlipidemia, mixed 08/15/2018  . Low back pain 07/18/2018  . History of lacunar cerebrovascular accident (CVA) 02/12/2018  . Dizziness 02/12/2018  . Myofascial pain 12/18/2017  . Spondylosis without myelopathy or radiculopathy, lumbar region 12/18/2017  . Lumbar radiculopathy, right 12/18/2017  . Cholelithiasis 05/17/2017  . Sinus Bradycardia 05/17/2017  . Thoracic radiculopathy 04/12/2017  . Primary osteoarthritis of right knee 12/14/2016  . DDD (degenerative disc disease), lumbar 10/10/2016  . Lumbar facet arthropathy 08/29/2016  . Idiopathic scoliosis 03/22/2016  . Diabetic neuropathy ([place]) 03/22/2016  . Dysautonomia orthostatic hypotension syndrome ([place]) 02/25/2016  . CKD  stage 3 due to type 2 diabetes mellitus ([place]) 02/19/2016  . Coronary artery disease involving coronary bypass graft of native heart  without angina pectoris   . Diabetes mellitus type 2, diet-controlled ([place])   . 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 Medication management 08/11/2015  . 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 PVD (peripheral vascular disease) ([place]) 01/01/2014  . Hyperlipidemia associated with type 2 diabetes mellitus ([place]) 04/11/2013  . Supine hypertension   . Vitamin D deficiency   . Vitamin B 12 deficiency   . Coronary atherosclerosis- s/p PCI to LAD in 2009 and PCI to RCA in 2011 02/27/2009  . Abdominal aortic aneurysm ([place]) 02/27/2009  . GERD 02/27/2009    Silvestre Mesi 06/17/2019, 2:13 PM  [facility] Physical Therapy [address] [place], [place], [address] Phone: [phone]   Fax:  [phone]  Name: TERRE DECELLE MRN: [id number] Date of Birth: 19-Jun-1933

## 2019-06-19 ENCOUNTER — Other Ambulatory Visit: Payer: Self-pay

## 2019-06-19 ENCOUNTER — Encounter: Payer: Medicare Other | Admitting: Physical Therapy

## 2019-06-19 ENCOUNTER — Ambulatory Visit: Payer: Medicare Other | Admitting: Physical Therapy

## 2019-06-19 DIAGNOSIS — G8929 Other chronic pain: Secondary | ICD-10-CM

## 2019-06-19 DIAGNOSIS — M5441 Lumbago with sciatica, right side: Secondary | ICD-10-CM

## 2019-06-19 DIAGNOSIS — R2689 Other abnormalities of gait and mobility: Secondary | ICD-10-CM

## 2019-06-19 DIAGNOSIS — R29818 Other symptoms and signs involving the nervous system: Secondary | ICD-10-CM | POA: Diagnosis not present

## 2019-06-19 DIAGNOSIS — M6281 Muscle weakness (generalized): Secondary | ICD-10-CM

## 2019-06-19 DIAGNOSIS — R42 Dizziness and giddiness: Secondary | ICD-10-CM

## 2019-06-19 NOTE — Therapy (Signed)
[facility] Physical Therapy [address] [place], [place], [address] Phone: [phone]   Fax:  [phone]  Physical Therapy Treatment/Progress note Progress Note reporting period 04/29/20 to 06/19/19  See below for objective and subjective measurements relating to patients progress with PT.   Patient Details  Name: [name] MRN: [id number] Date of Birth: 07-14-1933 Referring Provider (PT): [name], MD   Encounter Date: 06/19/2019  PT End of Session - 06/19/19 1153    Visit Number  10    Number of Visits  18    Date for PT Re-Evaluation  07/24/19    Authorization Type  UHC MCR    PT Start Time  1100    PT Stop Time  1145    PT Time Calculation (min)  45 min    Activity Tolerance  Patient tolerated treatment well    Behavior During Therapy  [facility] for tasks assessed/performed       Past Medical History:  Diagnosis Date  . 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 Colon polyps   . Coronary atherosclerosis of unspecified type of vessel, native or graft   . Diabetes ([place])   . Difficult intubation   . 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 Esophageal reflux   . High cholesterol   . History of IBS 02/27/2009  . Hypertension    pt denies, he says he has a h/o hypotension. If BP up he adjusts the Florinef  . Jejunitis with partial SBO 05/20/2017  . Orthostatic hypotension    "BP has been dropping alot when I stand up for the last month or so" (02/17/2016)  . Prostatitis   . Upper GI bleed 06/24/2018  . Vitamin B 12 deficiency   . Vitamin D deficiency     Past Surgical History:  Procedure Laterality Date  . BIOPSY  06/25/2018   Procedure: BIOPSY;  Surgeon: Rush Landmark Telford Nab., MD;  Location: [place];  Service: Gastroenterology;;  . BIOPSY  04/24/2019   Procedure: BIOPSY;  Surgeon: Irving Copas., MD;  Location: [facility];  Service: Gastroenterology;;  . BLADDER SURGERY  1969   traumatic pelvic fractures, urethral and bladder  repair  . CARDIAC CATHETERIZATION N/A 07/01/2015   Procedure: Left Heart Cath and Coronary Angiography;  Surgeon: Wellington Hampshire, MD;  Location: [place] [facility];  Service: Cardiovascular;  Laterality: N/A;  . COLON RESECTION N/A 05/17/2017   Procedure: DIAGNOSTIC LAPAROSCOPY,;  Surgeon: Leighton Ruff, MD;  [facility];  Service: General;  Laterality: N/A;  . COLONOSCOPY WITH PROPOFOL N/A 04/24/2019   Procedure: COLONOSCOPY WITH PROPOFOL;  Surgeon: Irving Copas., MD;  [facility];  Service: Gastroenterology;  Laterality: N/A;  . CORONARY ANGIOPLASTY WITH STENT PLACEMENT    . CORONARY ARTERY BYPASS GRAFT N/A 07/06/2015   Procedure: CORONARY ARTERY BYPASS GRAFTING (CABG)x 4   utilizing the left internal mammary artery and endoscopically harvested bilateral  sapheneous vein.;  Surgeon: Ivin Poot, MD;  Location: [place];  Service: Open Heart Surgery;  Laterality: N/A;  . ESOPHAGEAL DILATION  04/24/2019   Procedure: ESOPHAGEAL DILATION;  Surgeon: Rush Landmark Telford Nab., MD;  [facility];  Service: Gastroenterology;;  . ESOPHAGOGASTRODUODENOSCOPY (EGD) WITH PROPOFOL N/A 06/25/2018   Procedure: ESOPHAGOGASTRODUODENOSCOPY (EGD) WITH PROPOFOL;  Surgeon: Irving Copas., MD;  Location: [place];  Service: Gastroenterology;  Laterality: N/A;  . ESOPHAGOGASTRODUODENOSCOPY (EGD) WITH PROPOFOL N/A 04/24/2019   Procedure: ESOPHAGOGASTRODUODENOSCOPY (EGD) WITH PROPOFOL;  Surgeon: Rush Landmark Telford Nab., MD;  [facility];  Service: Gastroenterology;  Laterality: N/A;  . HEMOSTASIS CLIP PLACEMENT  04/24/2019   Procedure: HEMOSTASIS CLIP PLACEMENT;  Surgeon: Irving Copas., MD;  [facility];  Service: Gastroenterology;;  . KNEE SURGERY    . LOOP RECORDER INSERTION N/A 02/19/2018   Procedure: LOOP RECORDER INSERTION;  Surgeon: Deboraha Sprang, MD;  Location: [place] [facility];  Service: Cardiovascular;  Laterality: N/A;  .  POLYPECTOMY  04/24/2019   Procedure: POLYPECTOMY;  Surgeon: Rush Landmark Telford Nab., MD;  Location: [facility];  Service: Gastroenterology;;  . Lia Foyer LIFTING INJECTION  04/24/2019   Procedure: SUBMUCOSAL LIFTING INJECTION;  Surgeon: Irving Copas., MD;  [facility];  Service: Gastroenterology;;  . TEE WITHOUT CARDIOVERSION N/A 07/06/2015   Procedure: TRANSESOPHAGEAL ECHOCARDIOGRAM (TEE);  Surgeon: Ivin Poot, MD;  Location: [place];  Service: Open Heart Surgery;  Laterality: N/A;    There were no vitals filed for this visit.  Subjective Assessment - 06/19/19 1111    Subjective  woke up with some knee pain but its better since walking around some. He feels 75% improvement with PT thus far with leg strength, and walking.    Pertinent History  PMH: CVA X5, DM, CKD, CAD, CABGX4,    Limitations  Lifting;Standing;Walking;House hold activities    How long can you stand comfortably?  2 min    How long can you walk comfortably?  household distances    Diagnostic tests  Imaging shows "Spinal stenosis of lumbar region with neurogenic claudication, advanced lumbar DDD, Scoliosis"    Patient Stated Goals  stand and walk better, improve leg strength and pain    Pain Onset  More than a month ago         [facility] PT Assessment - 06/19/19 0001      Assessment   Medical Diagnosis  Spinal stenosis of lumbar region with neurogenic claudication, advanced lumbar DDD, Scoliosis, Rt leg weakness.    Referring Provider (PT)  [name], MD      Strength   Right Hip Flexion  4-/5    Right Hip ABduction  4/5    Left Hip Flexion  4/5    Left Hip ABduction  4/5    Right Knee Flexion  4+/5    Right Knee Extension  4+/5    Left Knee Flexion  4+/5    Left Knee Extension  4+/5      Transfers   Five time sit to stand comments   24 seconds from standard chair using arms to push ups with      Ambulation/Gait   Ambulation Distance (Feet)  500 Feet   140f lap X 4 for endurance  testing                  [facility] Adult PT Treatment/Exercise - 06/19/19 0001      Ambulation/Gait   Ambulation/Gait  Yes    Ambulation/Gait Assistance  5: Supervision    Assistive device  Rolling walker      Lumbar Exercises: Aerobic   Nustep  8 min recumbant bike L1      Knee/Hip Exercises: Machines for Strengthening   Cybex Leg Press  81 lbs 3/x10      Knee/Hip Exercises: Standing   Heel Raises  Both;15 reps;2 seconds;Limitations    Heel Raises Limitations  UE support    Other Standing Knee Exercises  --    Other Standing Knee Exercises  --      Knee/Hip Exercises: Seated   Long  [employer]  Both    Long Arc Quad Limitations  2.5 lbs  X10 reps then no wt for 10 reps    Marching  Both    Marching Limitations  2.5 lbs X 10 reps, then no wt X 10 reps    Sit to [employer]  with UE support;2 sets;5 reps                  PT Long Term Goals - 06/19/19 1140      PT LONG TERM GOAL #1   Title  independent with updated HEP (Target all LTGs 06/11/2019)    Time  6    Period  Weeks    Status  Achieved      PT LONG TERM GOAL #2   Title  Improve lumbar ROM to [facility] in standing (at least 60%).    Baseline  has the motion but still hurts    Time  6    Period  Weeks    Status  Partially Met      PT LONG TERM GOAL #3   Title  Improve bilat leg strength to at least 4+/5 MMT measured in sitting to improve function    Time  6    Period  Weeks    Status  On-going      PT LONG TERM GOAL #4   Title  Will improve 5TSTS to <25 sec to show improved leg strength and endurance. Revised on 06/19/19 to <20 seconds    Baseline  24 on sec on 2/17    Status  Achieved      PT LONG TERM GOAL #5   Title  Pt will be albe to stand and ambulate at least 10-15 minutes or 3000 ft with LRAD to show improved ability for limited community ambulation. Revised 2/17/2  to 625 ft    Time  6    Period  Weeks    Status  Achieved            Plan - 06/19/19 1155    Clinical Impression  Statement  Progress note as this is visit 10. He has made excellent progress with PT and has improved his strength, endurance, gait tolerance, and standing tolerance. He has met some of his PT goals but still has deficits in standing/ambulation endurance, and Rt leg weakness limiting his full functional abilites. He will continue to benefit skilled PT to address his deficits. PT anticipates 4 more weeks of PT for 8 visits to meet his remaining goals then discharge to HEP.    Personal Factors and Comorbidities  Comorbidity 3+    Comorbidities  PMH: CVA X5, DM, CKD, CAD, CABGX4,    Examination-Activity Limitations  Bed Mobility;Bend;Carry;Squat;Stairs;Lift;Stand;Transfers;Locomotion Level    Examination-Participation Restrictions  Meal Prep;Cleaning;Community Activity;Shop    Stability/Clinical Decision Making  Evolving/Moderate complexity    PT Frequency  2x / week    PT Duration  6 weeks    PT Treatment/Interventions  ADLs/Self Care Home Management;Cryotherapy;Iontophoresis 65m/ml Dexamethasone;Moist Heat;Gait training;Stair training;Functional mobility training;Therapeutic activities;Therapeutic exercise;Balance training;Neuromuscular re-education;Manual techniques;Passive range of motion;Taping    PT Next Visit Plan  review and update HEP PRN. needs bilat leg strength Rt is weaker than Lt, needs balance, gait with rollator. lumbar stretching    PT Home Exercise Plan  [facility]   Consulted and Agree with Plan of Care  Patient       Patient will benefit from skilled therapeutic intervention in order to improve the following deficits  and impairments:  Abnormal gait, Decreased activity tolerance, Decreased balance, Decreased endurance, Decreased range of motion, Decreased strength, Hypomobility, Difficulty walking, Impaired flexibility, Postural dysfunction, Pain  Visit Diagnosis: Other abnormalities of gait and mobility  Chronic bilateral low back pain with right-sided  sciatica  Muscle weakness (generalized)  Other symptoms and signs involving the nervous system  Dizziness and giddiness     Problem List Patient Active Problem List   Diagnosis Date Noted  . At high risk for falls 05/29/2019  . Chronic bilateral low back pain without sciatica 02/07/2019  . Statin intolerance 11/16/2018  . Hyperlipidemia, mixed 08/15/2018  . Low back pain 07/18/2018  . History of lacunar cerebrovascular accident (CVA) 02/12/2018  . Dizziness 02/12/2018  . Myofascial pain 12/18/2017  . Spondylosis without myelopathy or radiculopathy, lumbar region 12/18/2017  . Lumbar radiculopathy, right 12/18/2017  . Cholelithiasis 05/17/2017  . Sinus Bradycardia 05/17/2017  . Thoracic radiculopathy 04/12/2017  . Primary osteoarthritis of right knee 12/14/2016  . DDD (degenerative disc disease), lumbar 10/10/2016  . Lumbar facet arthropathy 08/29/2016  . Idiopathic scoliosis 03/22/2016  . Diabetic neuropathy ([place]) 03/22/2016  . Dysautonomia orthostatic hypotension syndrome ([place]) 02/25/2016  . CKD stage 3 due to type 2 diabetes mellitus ([place]) 02/19/2016  . Coronary artery disease involving coronary bypass graft of native heart without angina pectoris   . Diabetes mellitus type 2, diet-controlled ([place])   . 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 Medication management 08/11/2015  . 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 PVD (peripheral vascular disease) ([place]) 01/01/2014  . Hyperlipidemia associated with type 2 diabetes mellitus ([place]) 04/11/2013  . Supine hypertension   . Vitamin D deficiency   . Vitamin B 12 deficiency   . Coronary atherosclerosis- s/p PCI to LAD in 2009 and PCI to RCA in 2011 02/27/2009  . Abdominal aortic aneurysm ([place]) 02/27/2009  . GERD 02/27/2009    Silvestre Mesi 06/19/2019, 11:58 AM  [facility] Physical Therapy [address] [place], [place], [address] Phone: [phone]   Fax:  [phone]  Name: [name] MRN:  [id number] Date of Birth: [birth year]

## 2019-06-25 ENCOUNTER — Other Ambulatory Visit: Payer: Self-pay

## 2019-06-25 ENCOUNTER — Ambulatory Visit: Payer: Medicare Other | Admitting: Physical Therapy

## 2019-06-25 DIAGNOSIS — R2689 Other abnormalities of gait and mobility: Secondary | ICD-10-CM | POA: Diagnosis not present

## 2019-06-25 DIAGNOSIS — R42 Dizziness and giddiness: Secondary | ICD-10-CM

## 2019-06-25 DIAGNOSIS — M5441 Lumbago with sciatica, right side: Secondary | ICD-10-CM | POA: Diagnosis not present

## 2019-06-25 DIAGNOSIS — R29818 Other symptoms and signs involving the nervous system: Secondary | ICD-10-CM | POA: Diagnosis not present

## 2019-06-25 DIAGNOSIS — M6281 Muscle weakness (generalized): Secondary | ICD-10-CM | POA: Diagnosis not present

## 2019-06-25 DIAGNOSIS — G8929 Other chronic pain: Secondary | ICD-10-CM

## 2019-06-25 NOTE — Therapy (Signed)
[facility] Physical Therapy [address] [place], [place], [address] Phone: [phone]   Fax:  [phone]  Physical Therapy Treatment  Patient Details  Name: [name] MRN: [id number] Date of Birth: 04/17/1934 Referring Provider (PT): [name], MD   Encounter Date: 06/25/2019  PT End of Session - 06/25/19 1448    Visit Number  11    Number of Visits  18    Date for PT Re-Evaluation  07/24/19    Authorization Type  U[employer] MCR    PT Start Time  1315    PT Stop Time  1400    PT Time Calculation (min)  45 min    Activity Tolerance  Patient tolerated treatment well    Behavior During Therapy  [facility] for tasks assessed/performed       Past Medical History:  Diagnosis Date  . 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 Colon polyps   . Coronary atherosclerosis of unspecified type of vessel, native or graft   . Diabetes ([place])   . Difficult intubation   . 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 Esophageal reflux   . High cholesterol   . History of IBS 02/27/2009  . Hypertension    pt denies, he says he has a h/o hypotension. If BP up he adjusts the Florinef  . Jejunitis with partial SBO 05/20/2017  . Orthostatic hypotension    "BP has been dropping alot when I stand up for the last month or so" (02/17/2016)  . Prostatitis   . Upper GI bleed 06/24/2018  . Vitamin B 12 deficiency   . Vitamin D deficiency     Past Surgical History:  Procedure Laterality Date  . BIOPSY  06/25/2018   Procedure: BIOPSY;  Surgeon: Rush Landmark Telford Nab., MD;  Location: [place];  Service: Gastroenterology;;  . BIOPSY  04/24/2019   Procedure: BIOPSY;  Surgeon: Irving Copas., MD;  Location: [facility];  Service: Gastroenterology;;  . BLADDER SURGERY  1969   traumatic pelvic fractures, urethral and bladder repair  . CARDIAC CATHETERIZATION N/A 07/01/2015   Procedure: Left Heart Cath and Coronary Angiography;  Surgeon: Wellington Hampshire, MD;  Location: [place] [facility];  Service: Cardiovascular;  Laterality: N/A;  . COLON RESECTION N/A 05/17/2017   Procedure: DIAGNOSTIC LAPAROSCOPY,;  Surgeon: Leighton Ruff, MD;  Location: [facility];  Service: General;  Laterality: N/A;  . COLONOSCOPY WITH PROPOFOL N/A 04/24/2019   Procedure: COLONOSCOPY WITH PROPOFOL;  Surgeon: Irving Copas., MD;  [facility];  Service: Gastroenterology;  Laterality: N/A;  . CORONARY ANGIOPLASTY WITH STENT PLACEMENT    . CORONARY ARTERY BYPASS GRAFT N/A 07/06/2015   Procedure: CORONARY ARTERY BYPASS GRAFTING (CABG)x 4   utilizing the left internal mammary artery and endoscopically harvested bilateral  sapheneous vein.;  Surgeon: Ivin Poot, MD;  Location: [place];  Service: Open Heart Surgery;  Laterality: N/A;  . ESOPHAGEAL DILATION  04/24/2019   Procedure: ESOPHAGEAL DILATION;  Surgeon: Rush Landmark Telford Nab., MD;  [facility];  Service: Gastroenterology;;  . ESOPHAGOGASTRODUODENOSCOPY (EGD) WITH PROPOFOL N/A 06/25/2018   Procedure: ESOPHAGOGASTRODUODENOSCOPY (EGD) WITH PROPOFOL;  Surgeon: Irving Copas., MD;  Location: [place];  Service: Gastroenterology;  Laterality: N/A;  . ESOPHAGOGASTRODUODENOSCOPY (EGD) WITH PROPOFOL N/A 04/24/2019   Procedure: ESOPHAGOGASTRODUODENOSCOPY (EGD) WITH PROPOFOL;  Surgeon: Rush Landmark Telford Nab., MD;  [facility];  Service: Gastroenterology;  Laterality: N/A;  . HEMOSTASIS CLIP PLACEMENT  04/24/2019   Procedure: HEMOSTASIS CLIP PLACEMENT;  Surgeon: Justice Britain  Brooke Bonito., MD;  Location: [facility];  Service: Gastroenterology;;  . KNEE SURGERY    . LOOP RECORDER INSERTION N/A 02/19/2018   Procedure: LOOP RECORDER INSERTION;  Surgeon: Deboraha Sprang, MD;  Location: [place] [facility];  Service: Cardiovascular;  Laterality: N/A;  . POLYPECTOMY  04/24/2019   Procedure: POLYPECTOMY;  Surgeon: Rush Landmark Telford Nab., MD;  Location: [facility];  Service: Gastroenterology;;  . Lia Foyer LIFTING  INJECTION  04/24/2019   Procedure: SUBMUCOSAL LIFTING INJECTION;  Surgeon: Irving Copas., MD;  [facility];  Service: Gastroenterology;;  . TEE WITHOUT CARDIOVERSION N/A 07/06/2015   Procedure: TRANSESOPHAGEAL ECHOCARDIOGRAM (TEE);  Surgeon: Ivin Poot, MD;  Location: [place];  Service: Open Heart Surgery;  Laterality: N/A;    There were no vitals filed for this visit.  Subjective Assessment - 06/25/19 1359    Subjective  relays his legs are tired and week today, also having more dizziness    Pertinent History  PMH: CVA X5, DM, CKD, CAD, CABGX4,    Limitations  Lifting;Standing;Walking;House hold activities    How long can you stand comfortably?  2 min    How long can you walk comfortably?  household distances    Diagnostic tests  Imaging shows "Spinal stenosis of lumbar region with neurogenic claudication, advanced lumbar DDD, Scoliosis"    Patient Stated Goals  stand and walk better, improve leg strength and pain    Pain Onset  More than a month ago          [facility] Adult PT Treatment/Exercise - 06/25/19 0001      Transfers   Five time sit to stand comments   --      Ambulation/Gait   Ambulation/Gait  Yes    Ambulation/Gait Assistance  5: Supervision    Ambulation Distance (Feet)  500 Feet   138f lap X 4 for endurance testing   Assistive device  Rolling walker      Lumbar Exercises: Aerobic   Nustep  --      Knee/Hip Exercises: Aerobic   Recumbent Bike  L2 X 6 min      Knee/Hip Exercises: Machines for Strengthening   Cybex Leg Press  81 lbs 3/x10      Knee/Hip Exercises: Standing   Heel Raises  Both;15 reps;2 seconds;Limitations    Heel Raises Limitations  UE support    Forward Step Up Limitations   X10 reps bilat with UE support onto airex pad      Knee/Hip Exercises: Seated   Long Arc Quad  Both    Long Arc Quad Limitations  2.5 lbs  X10 reps then no wt for 10 reps    Marching  Both    Marching Limitations  2.5 lbs X 10 reps, then no wt X  10 reps    Hamstring Curl  Both;2 sets;10 reps    Hamstring Limitations  L3 band    Sit to Sand  with UE support;2 sets;5 reps                  PT Long Term Goals - 06/19/19 1140      PT LONG TERM GOAL #1   Title  independent with updated HEP (Target all LTGs 06/11/2019)    Time  6    Period  Weeks    Status  Achieved      PT LONG TERM GOAL #2   Title  Im[facility] lumbar [facility] to [facility] (at least 60[facility]    Baseline  has the motion but still hurts    Time  6    Period  Weeks    Status  Partially Met      PT LONG TERM GOAL #3   Title  Improve bilat leg strength to at least 4+/5 MMT measured in sitting to improve function    Time  6    Period  Weeks    Status  On-going      PT LONG TERM GOAL #4   Title  Will improve 5TSTS to <25 sec to show improved leg strength and endurance. Revised on 06/19/19 to <20 seconds    Baseline  24 on sec on 2/17    Status  Achieved      PT LONG TERM GOAL #5   Title  Pt will be albe to stand and ambulate at least 10-15 minutes or 3000 ft with LRAD to show improved ability for limited community ambulation. Revised 2/17/2  to 625 ft    Time  6    Period  Weeks    Status  Achieved            Plan - 06/25/19 1448    Clinical Impression Statement  His legs were sore upon arrival but still able to perform his usual exericises with usual resistance without complaints. He does still require rest breaks due to fatigue but he is improving some with this.    Personal Factors and Comorbidities  Comorbidity 3+    Comorbidities  PMH: CVA X5, DM, CKD, CAD, CABGX4,    Examination-Activity Limitations  Bed Mobility;Bend;Carry;Squat;Stairs;Lift;Stand;Transfers;Locomotion Level    Examination-Participation Restrictions  Meal Prep;Cleaning;Community Activity;Shop    Stability/Clinical Decision Making  Evolving/Moderate complexity    PT Frequency  2x / week    PT Duration  6 weeks    PT Treatment/Interventions  ADLs/Self Care Home  Management;Cryotherapy;Iontophoresis 46m/ml Dexamethasone;Moist Heat;Gait training;Stair training;Functional mobility training;Therapeutic activities;Therapeutic exercise;Balance training;Neuromuscular re-education;Manual techniques;Passive range of motion;Taping    PT Next Visit Plan  consider gait training with SPC needs bilat leg strength Rt is weaker than Lt, needs balance, gait with rollator. lumbar stretching    PT Home Exercise Plan  Access Code: [email or web address]   Consulted and Agree with Plan of Care  Patient       Patient will benefit from skilled therapeutic intervention in order to improve the following deficits and impairments:  Abnormal gait, Decreased activity tolerance, Decreased balance, Decreased endurance, Decreased range of motion, Decreased strength, Hypomobility, Difficulty walking, Impaired flexibility, Postural dysfunction, Pain  Visit Diagnosis: Other abnormalities of gait and mobility  Chronic bilateral low back pain with right-sided sciatica  Muscle weakness (generalized)  Other symptoms and signs involving the nervous system  Dizziness and giddiness     Problem List Patient Active Problem List   Diagnosis Date Noted  . At high risk for falls 05/29/2019  . Chronic bilateral low back pain without sciatica 02/07/2019  . Statin intolerance 11/16/2018  . Hyperlipidemia, mixed 08/15/2018  . Low back pain 07/18/2018  . History of lacunar cerebrovascular accident (CVA) 02/12/2018  . Dizziness 02/12/2018  . Myofascial pain 12/18/2017  . Spondylosis without myelopathy or radiculopathy, lumbar region 12/18/2017  . Lumbar radiculopathy, right 12/18/2017  . Cholelithiasis 05/17/2017  . Sinus Bradycardia 05/17/2017  . Thoracic radiculopathy 04/12/2017  . Primary osteoarthritis of right knee 12/14/2016  . DDD (degenerative disc disease), lumbar 10/10/2016  . Lumbar facet arthropathy 08/29/2016  . Idiopathic scoliosis 03/22/2016  . Diabetic neuropathy ([place] 03/22/2016   . Dysautonomia  orthostatic hypotension syndrome ([place]) 02/25/2016  . CKD stage 3 due to type 2 diabetes mellitus ([place]) 02/19/2016  . Coronary artery disease involving coronary bypass graft of native heart without angina pectoris   . Diabetes mellitus type 2, diet-controlled ([place])   . 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 Medication management 08/11/2015  . 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 PVD (peripheral vascular disease) ([place]) 01/01/2014  . Hyperlipidemia associated with type 2 diabetes mellitus ([place]) 04/11/2013  . Supine hypertension   . Vitamin D deficiency   . Vitamin B 12 deficiency   . Coronary atherosclerosis- s/p PCI to LAD in 2009 and PCI to RCA in 2011 02/27/2009  . Abdominal aortic aneurysm ([place]) 02/27/2009  . GERD 02/27/2009    [name], PT,DPT 06/25/2019, 2:55 PM  [facility] Physical Therapy [address] [place], [place], [address] Phone: [phone]   Fax:  [phone]  Name: [name] MRN: [id number] Date of Birth: 1933-10-08

## 2019-06-27 ENCOUNTER — Ambulatory Visit (INDEPENDENT_AMBULATORY_CARE_PROVIDER_SITE_OTHER): Payer: Medicare Other | Admitting: Physical Therapy

## 2019-06-27 ENCOUNTER — Other Ambulatory Visit: Payer: Self-pay

## 2019-06-27 DIAGNOSIS — M5441 Lumbago with sciatica, right side: Secondary | ICD-10-CM | POA: Diagnosis not present

## 2019-06-27 DIAGNOSIS — R2689 Other abnormalities of gait and mobility: Secondary | ICD-10-CM

## 2019-06-27 DIAGNOSIS — R29818 Other symptoms and signs involving the nervous system: Secondary | ICD-10-CM | POA: Diagnosis not present

## 2019-06-27 DIAGNOSIS — R42 Dizziness and giddiness: Secondary | ICD-10-CM

## 2019-06-27 DIAGNOSIS — M6281 Muscle weakness (generalized): Secondary | ICD-10-CM | POA: Diagnosis not present

## 2019-06-27 DIAGNOSIS — G8929 Other chronic pain: Secondary | ICD-10-CM

## 2019-06-27 NOTE — Therapy (Signed)
[facility] Physical Therapy [address] [place], [place], [address] Phone: [phone]   Fax:  [phone]  Physical Therapy Treatment  Patient Details  Name: [name] MRN: [id number] Date of Birth: June 22, 1933 Referring Provider (PT): [name], MD   Encounter Date: 06/27/2019  PT End of Session - 06/27/19 1132    Visit Number  12    Number of Visits  18    Date for PT Re-Evaluation  07/24/19    Authorization Type  UHC MCR    PT Start Time  1100    PT Stop Time  1140    PT Time Calculation (min)  40 min    Activity Tolerance  Patient tolerated treatment well    Behavior During Therapy  [facility] for tasks assessed/performed       Past Medical History:  Diagnosis Date  . 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 Colon polyps   . Coronary atherosclerosis of unspecified type of vessel, native or graft   . Diabetes ([place])   . Difficult intubation   . 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 Esophageal reflux   . High cholesterol   . History of IBS 02/27/2009  . Hypertension    pt denies, he says he has a h/o hypotension. If BP up he adjusts the Florinef  . Jejunitis with partial SBO 05/20/2017  . Orthostatic hypotension    "BP has been dropping alot when I stand up for the last month or so" (02/17/2016)  . Prostatitis   . Upper GI bleed 06/24/2018  . Vitamin B 12 deficiency   . Vitamin D deficiency     Past Surgical History:  Procedure Laterality Date  . BIOPSY  06/25/2018   Procedure: BIOPSY;  Surgeon: Rush Landmark Telford Nab., MD;  Location: [place];  Service: Gastroenterology;;  . BIOPSY  04/24/2019   Procedure: BIOPSY;  Surgeon: Irving Copas., MD;  Location: [facility];  Service: Gastroenterology;;  . BLADDER SURGERY  1969   traumatic pelvic fractures, urethral and bladder repair  . CARDIAC CATHETERIZATION N/A 07/01/2015   Procedure: Left Heart Cath and Coronary Angiography;  Surgeon: Wellington Hampshire, MD;  Location: [place] [facility];  Service: Cardiovascular;  Laterality: N/A;  . COLON RESECTION N/A 05/17/2017   Procedure: DIAGNOSTIC LAPAROSCOPY,;  Surgeon: Leighton Ruff, MD;  [facility];  Service: General;  Laterality: N/A;  . COLONOSCOPY WITH PROPOFOL N/A 04/24/2019   Procedure: COLONOSCOPY WITH PROPOFOL;  Surgeon: Irving Copas., MD;  [facility];  Service: Gastroenterology;  Laterality: N/A;  . CORONARY ANGIOPLASTY WITH STENT PLACEMENT    . CORONARY ARTERY BYPASS GRAFT N/A 07/06/2015   Procedure: CORONARY ARTERY BYPASS GRAFTING (CABG)x 4   utilizing the left internal mammary artery and endoscopically harvested bilateral  sapheneous vein.;  Surgeon: Ivin Poot, MD;  Location: [place];  Service: Open Heart Surgery;  Laterality: N/A;  . ESOPHAGEAL DILATION  04/24/2019   Procedure: ESOPHAGEAL DILATION;  Surgeon: Rush Landmark Telford Nab., MD;  [facility];  Service: Gastroenterology;;  . ESOPHAGOGASTRODUODENOSCOPY (EGD) WITH PROPOFOL N/A 06/25/2018   Procedure: ESOPHAGOGASTRODUODENOSCOPY (EGD) WITH PROPOFOL;  Surgeon: Irving Copas., MD;  Location: [place];  Service: Gastroenterology;  Laterality: N/A;  . ESOPHAGOGASTRODUODENOSCOPY (EGD) WITH PROPOFOL N/A 04/24/2019   Procedure: ESOPHAGOGASTRODUODENOSCOPY (EGD) WITH PROPOFOL;  Surgeon: Rush Landmark Telford Nab., MD;  [facility];  Service: Gastroenterology;  Laterality: N/A;  . HEMOSTASIS CLIP PLACEMENT  04/24/2019   Procedure: HEMOSTASIS CLIP PLACEMENT;  Surgeon: Justice Britain  Brooke Bonito., MD;  Location: [facility];  Service: Gastroenterology;;  . KNEE SURGERY    . LOOP RECORDER INSERTION N/A 02/19/2018   Procedure: LOOP RECORDER INSERTION;  Surgeon: Deboraha Sprang, MD;  Location: [place] [facility];  Service: Cardiovascular;  Laterality: N/A;  . POLYPECTOMY  04/24/2019   Procedure: POLYPECTOMY;  Surgeon: Rush Landmark Telford Nab., MD;  Location: [facility];  Service: Gastroenterology;;  . Lia Foyer LIFTING  INJECTION  04/24/2019   Procedure: SUBMUCOSAL LIFTING INJECTION;  Surgeon: Irving Copas., MD;  [facility];  Service: Gastroenterology;;  . TEE WITHOUT CARDIOVERSION N/A 07/06/2015   Procedure: TRANSESOPHAGEAL ECHOCARDIOGRAM (TEE);  Surgeon: Ivin Poot, MD;  Location: [place];  Service: Open Heart Surgery;  Laterality: N/A;    There were no vitals filed for this visit.  Subjective Assessment - 06/27/19 1125    Subjective  relays his legs are sore and weak, feels like Rt leg will give out sometimes    Pertinent History  PMH: CVA X5, DM, CKD, CAD, CABGX4,    Limitations  Lifting;Standing;Walking;House hold activities    How long can you stand comfortably?  2 min    How long can you walk comfortably?  household distances    Diagnostic tests  Imaging shows "Spinal stenosis of lumbar region with neurogenic claudication, advanced lumbar DDD, Scoliosis"    Patient Stated Goals  stand and walk better, improve leg strength and pain    Pain Onset  More than a month ago         [facility] PT Assessment - 06/27/19 0001      Ambulation/Gait   Ambulation/Gait  Yes    Ambulation/Gait Assistance  5: Supervision    Ambulation/Gait Assistance Details  CGA to supervision    Assistive device  Rolling walker                   [facility] Adult PT Treatment/Exercise - 06/27/19 0001      Ambulation/Gait   Ambulation Distance (Feet)  500 Feet   139f lap X 4 for endurance testing     Lumbar Exercises: Aerobic   Recumbent Bike  7 min L2      Knee/Hip Exercises: Machines for Strengthening   Cybex Leg Press  81 lbs 3x10      Knee/Hip Exercises: Standing   Heel Raises  Both;15 reps;Limitations    Heel Raises Limitations  UE support    Forward Step Up Limitations   X12 reps bilat with UE support onto 4 inch step at treadmil rail      Knee/Hip Exercises: Seated   Sit to Sand  with UE support;2 sets;5 reps                  PT Long Term Goals - 06/19/19 1140       PT LONG TERM GOAL #1   Title  independent with updated HEP (Target all LTGs 06/11/2019)    Time  6    Period  Weeks    Status  Achieved      PT LONG TERM GOAL #2   Title  Improve lumbar ROM to [facility] (at least 60%).    Baseline  has the motion but still hurts    Time  6    Period  Weeks    Status  Partially Met      PT LONG TERM GOAL #3   Title  Improve bilat leg strength to at least 4+/5 MMT measured in sitting to improve  function    Time  6    Period  Weeks    Status  On-going      PT LONG TERM GOAL #4   Title  Will improve 5TSTS to <25 sec to show improved leg strength and endurance. Revised on 06/19/19 to <20 seconds    Baseline  24 on sec on 2/17    Status  Achieved      PT LONG TERM GOAL #5   Title  Pt will be albe to stand and ambulate at least 10-15 minutes or 3000 ft with LRAD to show improved ability for limited community ambulation. Revised 2/17/2  to 625 ft    Time  6    Period  Weeks    Status  Achieved            Plan - 06/27/19 1133    Clinical Impression Statement  walked longer today and added resitance to recumbant bike. He was fatigued after his so some seated therex was held. He continues to make steady progress with his leg strength and ovrall endurance.    Personal Factors and Comorbidities  Comorbidity 3+    Comorbidities  PMH: CVA X5, DM, CKD, CAD, CABGX4,    Examination-Activity Limitations  Bed Mobility;Bend;Carry;Squat;Stairs;Lift;Stand;Transfers;Locomotion Level    Examination-Participation Restrictions  Meal Prep;Cleaning;Community Activity;Shop    Stability/Clinical Decision Making  Evolving/Moderate complexity    PT Frequency  2x / week    PT Duration  6 weeks    PT Treatment/Interventions  ADLs/Self Care Home Management;Cryotherapy;Iontophoresis 91m/ml Dexamethasone;Moist Heat;Gait training;Stair training;Functional mobility training;Therapeutic activities;Therapeutic exercise;Balance training;Neuromuscular re-education;Manual  techniques;Passive range of motion;Taping    PT Next Visit Plan  consider gait training with SPC needs bilat leg strength Rt is weaker than Lt, needs balance, gait with rollator. lumbar stretching    PT Home Exercise Plan  Access Code: [email or web address]   Consulted and Agree with Plan of Care  Patient       Patient will benefit from skilled therapeutic intervention in order to improve the following deficits and impairments:  Abnormal gait, Decreased activity tolerance, Decreased balance, Decreased endurance, Decreased range of motion, Decreased strength, Hypomobility, Difficulty walking, Impaired flexibility, Postural dysfunction, Pain  Visit Diagnosis: Other abnormalities of gait and mobility  Chronic bilateral low back pain with right-sided sciatica  Muscle weakness (generalized)  Other symptoms and signs involving the nervous system  Dizziness and giddiness     Problem List Patient Active Problem List   Diagnosis Date Noted  . At high risk for falls 05/29/2019  . Chronic bilateral low back pain without sciatica 02/07/2019  . Statin intolerance 11/16/2018  . Hyperlipidemia, mixed 08/15/2018  . Low back pain 07/18/2018  . History of lacunar cerebrovascular accident (CVA) 02/12/2018  . Dizziness 02/12/2018  . Myofascial pain 12/18/2017  . Spondylosis without myelopathy or radiculopathy, lumbar region 12/18/2017  . Lumbar radiculopathy, right 12/18/2017  . Cholelithiasis 05/17/2017  . Sinus Bradycardia 05/17/2017  . Thoracic radiculopathy 04/12/2017  . Primary osteoarthritis of right knee 12/14/2016  . DDD (degenerative disc disease), lumbar 10/10/2016  . Lumbar facet arthropathy 08/29/2016  . Idiopathic scoliosis 03/22/2016  . Diabetic neuropathy ([place] 03/22/2016  . Dysautonomia orthostatic hypotension syndrome ([place] 02/25/2016  . CKD stage 3 due to type 2 diabetes mellitus ([place] 02/19/2016  . Coronary artery disease involving coronary bypass graft of native heart without angina  pectoris   . Diabetes mellitus type 2, diet-controlled ([place]   . 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 Medication management 08/11/2015  .  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 PVD (peripheral vascular disease) ([place]) 01/01/2014  . Hyperlipidemia associated with type 2 diabetes mellitus ([place]) 04/11/2013  . Supine hypertension   . Vitamin D deficiency   . Vitamin B 12 deficiency   . Coronary atherosclerosis- s/p PCI to LAD in 2009 and PCI to RCA in 2011 02/27/2009  . Abdominal aortic aneurysm ([place]) 02/27/2009  . GERD 02/27/2009    Silvestre Mesi 06/27/2019, 11:36 AM  [facility] Physical Therapy [address] [place], [place], [address] Phone: [phone]   Fax:  [phone]  Name: [name] MRN: [id number] Date of Birth: [birth year]

## 2019-07-02 ENCOUNTER — Other Ambulatory Visit: Payer: Self-pay

## 2019-07-02 ENCOUNTER — Ambulatory Visit (INDEPENDENT_AMBULATORY_CARE_PROVIDER_SITE_OTHER): Payer: Medicare Other | Admitting: Physical Therapy

## 2019-07-02 DIAGNOSIS — M5441 Lumbago with sciatica, right side: Secondary | ICD-10-CM | POA: Diagnosis not present

## 2019-07-02 DIAGNOSIS — R42 Dizziness and giddiness: Secondary | ICD-10-CM

## 2019-07-02 DIAGNOSIS — R2689 Other abnormalities of gait and mobility: Secondary | ICD-10-CM | POA: Diagnosis not present

## 2019-07-02 DIAGNOSIS — G8929 Other chronic pain: Secondary | ICD-10-CM

## 2019-07-02 DIAGNOSIS — R29818 Other symptoms and signs involving the nervous system: Secondary | ICD-10-CM | POA: Diagnosis not present

## 2019-07-02 DIAGNOSIS — M6281 Muscle weakness (generalized): Secondary | ICD-10-CM | POA: Diagnosis not present

## 2019-07-02 NOTE — Therapy (Signed)
[place], [place], [address] Phone: [phone]   Fax:  [phone]  Physical Therapy Treatment  Patient Details  Name: GREGROY DOMBKOWSKI MRN: [id number] Date of Birth: Mar 14, 1934 Referring Provider (PT): [name], MD   Encounter Date: 07/02/2019  PT End of Session - 07/02/19 1044    Visit Number  13    Number of Visits  18    Date for PT Re-Evaluation  07/24/19    Authorization Type  U[employer] MCR    PT Start Time  4742    PT Stop Time  1055    PT Time Calculation (min)  40 min    Activity Tolerance  Patient tolerated treatment well    Behavior During Therapy  [facility] for tasks assessed/performed       Past Medical History:  Diagnosis Date  . 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 Colon polyps   . Coronary atherosclerosis of unspecified type of vessel, native or graft   . Diabetes ([place])   . Difficult intubation   . 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 Esophageal reflux   . High cholesterol   . History of IBS 02/27/2009  . Hypertension    pt denies, he says he has a h/o hypotension. If BP up he adjusts the Florinef  . Jejunitis with partial SBO 05/20/2017  . Orthostatic hypotension    "BP has been dropping alot when I stand up for the last month or so" (02/17/2016)  . Prostatitis   . Upper GI bleed 06/24/2018  . Vitamin B 12 deficiency   . Vitamin D deficiency     Past Surgical History:  Procedure Laterality Date  . BIOPSY  06/25/2018   Procedure: BIOPSY;  Surgeon: Rush Landmark Telford Nab., MD;  Location: [place];  Service: Gastroenterology;;  . BIOPSY  04/24/2019   Procedure: BIOPSY;  Surgeon: Irving Copas., MD;  [facility];  Service: Gastroenterology;;  . BLADDER SURGERY  1969   traumatic pelvic fractures, urethral and bladder repair  . CARDIAC CATHETERIZATION N/A 07/01/2015   Procedure: Left Heart Cath and Coronary Angiography;  Surgeon: Wellington Hampshire, MD;  Location: [place] [facility];  Service: Cardiovascular;  Laterality: N/A;  . COLON RESECTION N/A 05/17/2017   Procedure: DIAGNOSTIC LAPAROSCOPY,;  Surgeon: Leighton Ruff, MD;  [facility];  Service: General;  Laterality: N/A;  . COLONOSCOPY WITH PROPOFOL N/A 04/24/2019   Procedure: COLONOSCOPY WITH PROPOFOL;  Surgeon: Irving Copas., MD;  [facility];  Service: Gastroenterology;  Laterality: N/A;  . CORONARY ANGIOPLASTY WITH STENT PLACEMENT    . CORONARY ARTERY BYPASS GRAFT N/A 07/06/2015   Procedure: CORONARY ARTERY BYPASS GRAFTING (CABG)x 4   utilizing the left internal mammary artery and endoscopically harvested bilateral  sapheneous vein.;  Surgeon: Ivin Poot, MD;  Location: [place];  Service: Open Heart Surgery;  Laterality: N/A;  . ESOPHAGEAL DILATION  04/24/2019   Procedure: ESOPHAGEAL DILATION;  Surgeon: Rush Landmark Telford Nab., MD;  [facility];  Service: Gastroenterology;;  . ESOPHAGOGASTRODUODENOSCOPY (EGD) WITH PROPOFOL N/A 06/25/2018   Procedure: ESOPHAGOGASTRODUODENOSCOPY (EGD) WITH PROPOFOL;  Surgeon: Irving Copas., MD;  Location: [place];  Service: Gastroenterology;  Laterality: N/A;  . ESOPHAGOGASTRODUODENOSCOPY (EGD) WITH PROPOFOL N/A 04/24/2019   Procedure: ESOPHAGOGASTRODUODENOSCOPY (EGD) WITH PROPOFOL;  Surgeon: Rush Landmark Telford Nab., MD;  [facility];  Service: Gastroenterology;  Laterality: N/A;  . HEMOSTASIS CLIP PLACEMENT  04/24/2019   Procedure: HEMOSTASIS CLIP PLACEMENT;  Surgeon: Justice Britain  Brooke Bonito., MD;  Location: [facility];  Service: Gastroenterology;;  . KNEE SURGERY    . LOOP RECORDER INSERTION N/A 02/19/2018   Procedure: LOOP RECORDER INSERTION;  Surgeon: Deboraha Sprang, MD;  Location: [place] [facility];  Service: Cardiovascular;  Laterality: N/A;  . POLYPECTOMY  04/24/2019   Procedure: POLYPECTOMY;  Surgeon: Rush Landmark Telford Nab., MD;  Location[facility];  Service: Gastroenterology;;  . Lia Foyer LIFTING  INJECTION  04/24/2019   Procedure: SUBMUCOSAL LIFTING INJECTION;  Surgeon: Irving Copas., MD;  [facility];  Service: Gastroenterology;;  . TEE WITHOUT CARDIOVERSION N/A 07/06/2015   Procedure: TRANSESOPHAGEAL ECHOCARDIOGRAM (TEE);  Surgeon: Ivin Poot, MD;  Location: [place];  Service: Open Heart Surgery;  Laterality: N/A;    There were no vitals filed for this visit.  Subjective Assessment - 07/02/19 1044    Subjective  relays he is feeling okay today, no new complaints    Pertinent History  PMH: CVA X5, DM, CKD, CAD, CABGX4,    Limitations  Lifting;Standing;Walking;House hold activities    How long can you stand comfortably?  2 min    How long can you walk comfortably?  household distances    Diagnostic tests  Imaging shows "Spinal stenosis of lumbar region with neurogenic claudication, advanced lumbar DDD, Scoliosis"    Patient Stated Goals  stand and walk better, improve leg strength and pain    Pain Onset  More than a month ago       [facility] Adult PT Treatment/Exercise - 07/02/19 0001      Ambulation/Gait   Ambulation/Gait  Yes    Ambulation/Gait Assistance  5: Supervision    Ambulation Distance (Feet)  500 Feet    Assistive device  Rollator      Knee/Hip Exercises: Aerobic   Nustep  L6 X 8 min      Knee/Hip Exercises: Machines for Strengthening   Cybex Leg Press  81 lbs 3x10      Knee/Hip Exercises: Standing   Heel Raises  Both;15 reps;Limitations    Heel Raises Limitations  UE support    Forward Step Up Limitations   X15 reps bilat with UE support onto 4 inch step at treadmil rail         PT Long Term Goals - 06/19/19 1140      PT LONG TERM GOAL #1   Title  independent with updated HEP (Target all LTGs 06/11/2019)    Time  6    Period  Weeks    Status  Achieved      PT LONG TERM GOAL #2   Title  Improve lumbar ROM to [facility] in standing (at least 60%).    Baseline  has the motion but still hurts    Time  6    Period  Weeks    Status   Partially Met      PT LONG TERM GOAL #3   Title  Improve bilat leg strength to at least 4+/5 MMT measured in sitting to improve function    Time  6    Period  Weeks    Status  On-going      PT LONG TERM GOAL #4   Title  Will improve 5TSTS to <25 sec to show improved leg strength and endurance. Revised on 06/19/19 to <20 seconds    Baseline  24 on sec on 2/17    Status  Achieved      PT LONG TERM GOAL #5   Title  Pt will  be albe to stand and ambulate at least 10-15 minutes or 3000 ft with LRAD to show improved ability for limited community ambulation. Revised 2/17/2  to 625 ft    Time  6    Period  Weeks    Status  Achieved            Plan - 07/02/19 1045    Clinical Impression Statement  Continued to focus on progression of leg strength, ambulation tolerance, and overall endurace today. PT will continue to progress as able toward his functional goals.    Personal Factors and Comorbidities  Comorbidity 3+    Comorbidities  PMH: CVA X5, DM, CKD, CAD, CABGX4,    Examination-Activity Limitations  Bed Mobility;Bend;Carry;Squat;Stairs;Lift;Stand;Transfers;Locomotion Level    Examination-Participation Restrictions  Meal Prep;Cleaning;Community Activity;Shop    Stability/Clinical Decision Making  Evolving/Moderate complexity    PT Frequency  2x / week    PT Duration  6 weeks    PT Treatment/Interventions  ADLs/Self Care Home Management;Cryotherapy;Iontophoresis 35m/ml Dexamethasone;Moist Heat;Gait training;Stair training;Functional mobility training;Therapeutic activities;Therapeutic exercise;Balance training;Neuromuscular re-education;Manual techniques;Passive range of motion;Taping    PT Next Visit Plan  consider gait training with SPC needs bilat leg strength Rt is weaker than Lt, needs balance, gait with rollator. lumbar stretching    PT Home Exercise Plan  Access Code: [email or web address]   Consulted and Agree with Plan of Care  Patient       Patient will benefit from skilled therapeutic  intervention in order to improve the following deficits and impairments:  Abnormal gait, Decreased activity tolerance, Decreased balance, Decreased endurance, Decreased range of motion, Decreased strength, Hypomobility, Difficulty walking, Impaired flexibility, Postural dysfunction, Pain  Visit Diagnosis: Other abnormalities of gait and mobility  Chronic bilateral low back pain with right-sided sciatica  Muscle weakness (generalized)  Other symptoms and signs involving the nervous system  Dizziness and giddiness     Problem List Patient Active Problem List   Diagnosis Date Noted  . At high risk for falls 05/29/2019  . Chronic bilateral low back pain without sciatica 02/07/2019  . Statin intolerance 11/16/2018  . Hyperlipidemia, mixed 08/15/2018  . Low back pain 07/18/2018  . History of lacunar cerebrovascular accident (CVA) 02/12/2018  . Dizziness 02/12/2018  . Myofascial pain 12/18/2017  . Spondylosis without myelopathy or radiculopathy, lumbar region 12/18/2017  . Lumbar radiculopathy, right 12/18/2017  . Cholelithiasis 05/17/2017  . Sinus Bradycardia 05/17/2017  . Thoracic radiculopathy 04/12/2017  . Primary osteoarthritis of right knee 12/14/2016  . DDD (degenerative disc disease), lumbar 10/10/2016  . Lumbar facet arthropathy 08/29/2016  . Idiopathic scoliosis 03/22/2016  . Diabetic neuropathy ([place] 03/22/2016  . Dysautonomia orthostatic hypotension syndrome ([place] 02/25/2016  . CKD stage 3 due to type 2 diabetes mellitus ([place] 02/19/2016  . Coronary artery disease involving coronary bypass graft of native heart without angina pectoris   . Diabetes mellitus type 2, diet-controlled ([place]   . 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 Medication management 08/11/2015  . 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 PVD (peripheral vascular disease) ([place] 01/01/2014  . Hyperlipidemia associated with type 2 diabetes mellitus ([place] 04/11/2013  . Supine hypertension   . Vitamin D deficiency   .  Vitamin B 12 deficiency   . Coronary atherosclerosis- s/p PCI to LAD in 2009 and PCI to RCA in 2011 02/27/2009  . Abdominal aortic aneurysm ([place] 02/27/2009  . GERD 02/27/2009    BDebbe Odea PT,DPT 07/02/2019, 10:58 AM  [facility] [address] [address]  Phone: [phone]   Fax:  [phone]  Name: FARRELL PANTALEO MRN: [id number] Date of Birth: [birth year]

## 2019-07-04 ENCOUNTER — Ambulatory Visit (INDEPENDENT_AMBULATORY_CARE_PROVIDER_SITE_OTHER): Payer: Medicare Other | Admitting: *Deleted

## 2019-07-04 DIAGNOSIS — Z8673 Personal history of transient ischemic attack (TIA), and cerebral infarction without residual deficits: Secondary | ICD-10-CM

## 2019-07-04 LAB — CUP PACEART REMOTE DEVICE CHECK
Date Time Interrogation Session: 20210304030946
Implantable Pulse Generator Implant Date: 20191021

## 2019-07-04 NOTE — Progress Notes (Signed)
ILR Remote 

## 2019-07-05 ENCOUNTER — Other Ambulatory Visit: Payer: Self-pay

## 2019-07-05 ENCOUNTER — Ambulatory Visit (INDEPENDENT_AMBULATORY_CARE_PROVIDER_SITE_OTHER): Payer: Medicare Other | Admitting: Physical Therapy

## 2019-07-05 DIAGNOSIS — G8929 Other chronic pain: Secondary | ICD-10-CM

## 2019-07-05 DIAGNOSIS — M6281 Muscle weakness (generalized): Secondary | ICD-10-CM

## 2019-07-05 DIAGNOSIS — R2689 Other abnormalities of gait and mobility: Secondary | ICD-10-CM

## 2019-07-05 DIAGNOSIS — M5441 Lumbago with sciatica, right side: Secondary | ICD-10-CM

## 2019-07-05 DIAGNOSIS — R29818 Other symptoms and signs involving the nervous system: Secondary | ICD-10-CM | POA: Diagnosis not present

## 2019-07-05 DIAGNOSIS — R42 Dizziness and giddiness: Secondary | ICD-10-CM

## 2019-07-05 NOTE — Therapy (Signed)
[facility] Physical Therapy [address] [place], [place], [address] Phone: [phone]   Fax:  [phone]  Physical Therapy Treatment  Patient Details  Name: [name] MRN: [id number] Date of Birth: Nov 19, 1933 Referring Provider (PT): [name], MD   Encounter Date: 07/05/2019  PT End of Session - 07/05/19 1232    Visit Number  14    Number of Visits  18    Date for PT Re-Evaluation  07/24/19    Authorization Type  UHC MCR    PT Start Time  1105    PT Stop Time  1145    PT Time Calculation (min)  40 min    Activity Tolerance  Patient tolerated treatment well    Behavior During Therapy  [facility] for tasks assessed/performed       Past Medical History:  Diagnosis Date  . 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 Colon polyps   . Coronary atherosclerosis of unspecified type of vessel, native or graft   . Diabetes ([place])   . Difficult intubation   . 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 Esophageal reflux   . High cholesterol   . History of IBS 02/27/2009  . Hypertension    pt denies, he says he has a h/o hypotension. If BP up he adjusts the [place]  . Jejunitis with partial SBO 05/20/2017  . Orthostatic hypotension    "BP has been dropping alot when I stand up for the last month or so" (02/17/2016)  . Prostatitis   . Upper GI bleed 06/24/2018  . Vitamin B 12 deficiency   . Vitamin D deficiency     Past Surgical History:  Procedure Laterality Date  . BIOPSY  06/25/2018   Procedure: BIOPSY;  Surgeon: Rush Landmark Telford Nab., MD;  Location: [place];  Service: Gastroenterology;;  . BIOPSY  04/24/2019   Procedure: BIOPSY;  Surgeon: Irving Copas., MD;  Location[facility];  Service: Gastroenterology;;  . BLADDER SURGERY  1969   traumatic pelvic fractures, urethral and bladder repair  . CARDIAC CATHETERIZATION N/A 07/01/2015   Procedure: Left Heart Cath and Coronary Angiography;  Surgeon: Wellington Hampshire, MD;  Location: [place] [facility];  Service: Cardiovascular;  Laterality: N/A;  . COLON RESECTION N/A 05/17/2017   Procedure: DIAGNOSTIC LAPAROSCOPY,;  Surgeon: Leighton Ruff, MD;  [facility];  Service: General;  Laterality: N/A;  . COLONOSCOPY WITH PROPOFOL N/A 04/24/2019   Procedure: COLONOSCOPY WITH PROPOFOL;  Surgeon: Irving Copas., MD;  [facility];  Service: Gastroenterology;  Laterality: N/A;  . CORONARY ANGIOPLASTY WITH STENT PLACEMENT    . CORONARY ARTERY BYPASS GRAFT N/A 07/06/2015   Procedure: CORONARY ARTERY BYPASS GRAFTING (CABG)x 4   utilizing the left internal mammary artery and endoscopically harvested bilateral  sapheneous vein.;  Surgeon: Ivin Poot, MD;  Location: [place];  Service: Open Heart Surgery;  Laterality: N/A;  . ESOPHAGEAL DILATION  04/24/2019   Procedure: ESOPHAGEAL DILATION;  Surgeon: Rush Landmark Telford Nab., MD;  [facility];  Service: Gastroenterology;;  . ESOPHAGOGASTRODUODENOSCOPY (EGD) WITH PROPOFOL N/A 06/25/2018   Procedure: ESOPHAGOGASTRODUODENOSCOPY (EGD) WITH PROPOFOL;  Surgeon: Irving Copas., MD;  Location: [place];  Service: Gastroenterology;  Laterality: N/A;  . ESOPHAGOGASTRODUODENOSCOPY (EGD) WITH PROPOFOL N/A 04/24/2019   Procedure: ESOPHAGOGASTRODUODENOSCOPY (EGD) WITH PROPOFOL;  Surgeon: Rush Landmark Telford Nab., MD;  [facility];  Service: Gastroenterology;  Laterality: N/A;  . HEMOSTASIS CLIP PLACEMENT  04/24/2019   Procedure: HEMOSTASIS CLIP PLACEMENT;  Surgeon: Justice Britain  Brooke Bonito., MD;  Location: [facility];  Service: Gastroenterology;;  . KNEE SURGERY    . LOOP RECORDER INSERTION N/A 02/19/2018   Procedure: LOOP RECORDER INSERTION;  Surgeon: Deboraha Sprang, MD;  Location: [place] [facility];  Service: Cardiovascular;  Laterality: N/A;  . POLYPECTOMY  04/24/2019   Procedure: POLYPECTOMY;  Surgeon: Rush Landmark Telford Nab., MD;  [facility];  Service: Gastroenterology;;  . Lia Foyer LIFTING  INJECTION  04/24/2019   Procedure: SUBMUCOSAL LIFTING INJECTION;  Surgeon: Irving Copas., MD;  [facility];  Service: Gastroenterology;;  . TEE WITHOUT CARDIOVERSION N/A 07/06/2015   Procedure: TRANSESOPHAGEAL ECHOCARDIOGRAM (TEE);  Surgeon: Ivin Poot, MD;  Location: [place];  Service: Open Heart Surgery;  Laterality: N/A;    There were no vitals filed for this visit.  Subjective Assessment - 07/05/19 1233    Subjective  feeling tired today, Rt leg is a little numb but is getting stronger    Pertinent History  PMH: CVA X5, DM, CKD, CAD, CABGX4,    Limitations  Lifting;Standing;Walking;House hold activities    How long can you stand comfortably?  2 min    How long can you walk comfortably?  household distances    Diagnostic tests  Imaging shows "Spinal stenosis of lumbar region with neurogenic claudication, advanced lumbar DDD, Scoliosis"    Patient Stated Goals  stand and walk better, improve leg strength and pain    Pain Onset  More than a month ago                       [facility] Adult PT Treatment/Exercise - 07/05/19 0001      Ambulation/Gait   Ambulation/Gait  Yes    Ambulation/Gait Assistance  5: Supervision    Ambulation Distance (Feet)  500 Feet    Assistive device  Rollator      Lumbar Exercises: Aerobic   Nustep  10 min L5 UE/LE                Knee/Hip Exercises: Machines for Strengthening   Cybex Leg Press  --      Knee/Hip Exercises: Standing   Heel Raises  Both;15 reps;Limitations    Heel Raises Limitations  UE support    Forward Step Up Limitations   X15 reps bilat with UE support onto 4 inch step at treadmil rail      Knee/Hip Exercises: Seated   Long Arc Quad  Both    Long Arc Quad Weight  3 lbs.    Long Arc Quad Limitations  2 X10    Marching  Both    Marching Limitations  3 lbs 2X10    Sit to [employer]  3 sets;5 reps;with UE support                  PT Long Term Goals - 06/19/19 1140      PT LONG TERM GOAL  #1   Title  independent with updated HEP (Target all LTGs 06/11/2019)    Time  6    Period  Weeks    Status  Achieved      PT LONG TERM GOAL #2   Title  Improve lumbar ROM to [facility] in standing (at least 60%).    Baseline  has the motion but still hurts    Time  6    Period  Weeks    Status  Partially Met      PT LONG TERM GOAL #3   Title  Improve bilat leg strength to at least 4+/5 MMT measured in sitting to improve function    Time  6    Period  Weeks    Status  On-going      PT LONG TERM GOAL #4   Title  Will improve 5TSTS to <25 sec to show improved leg strength and endurance. Revised on 06/19/19 to <20 seconds    Baseline  24 on sec on 2/17    Status  Achieved      PT LONG TERM GOAL #5   Title  Pt will be albe to stand and ambulate at least 10-15 minutes or 3000 ft with LRAD to show improved ability for limited community ambulation. Revised 2/17/2  to 625 ft    Time  6    Period  Weeks    Status  Achieved            Plan - 07/05/19 1323    Clinical Impression Statement  Pt continues to make progression of endurance and LE strength. Is able to flex his Rt hip higher with more reistance today. Continue POC    Personal Factors and Comorbidities  Comorbidity 3+    Comorbidities  PMH: CVA X5, DM, CKD, CAD, CABGX4,    Examination-Activity Limitations  Bed Mobility;Bend;Carry;Squat;Stairs;Lift;Stand;Transfers;Locomotion Level    Examination-Participation Restrictions  Meal Prep;Cleaning;Community Activity;Shop    Stability/Clinical Decision Making  Evolving/Moderate complexity    PT Frequency  2x / week    PT Duration  6 weeks    PT Treatment/Interventions  ADLs/Self Care Home Management;Cryotherapy;Iontophoresis 38m/ml Dexamethasone;Moist Heat;Gait training;Stair training;Functional mobility training;Therapeutic activities;Therapeutic exercise;Balance training;Neuromuscular re-education;Manual techniques;Passive range of motion;Taping    PT Next Visit Plan  consider gait  training with SPC needs bilat leg strength Rt is weaker than Lt, needs balance, gait with rollator. lumbar stretching    PT Home Exercise Plan  Access Code: [email or web address]   Consulted and Agree with Plan of Care  Patient       Patient will benefit from skilled therapeutic intervention in order to improve the following deficits and impairments:  Abnormal gait, Decreased activity tolerance, Decreased balance, Decreased endurance, Decreased range of motion, Decreased strength, Hypomobility, Difficulty walking, Impaired flexibility, Postural dysfunction, Pain  Visit Diagnosis: Other abnormalities of gait and mobility  Chronic bilateral low back pain with right-sided sciatica  Muscle weakness (generalized)  Other symptoms and signs involving the nervous system  Dizziness and giddiness     Problem List Patient Active Problem List   Diagnosis Date Noted  . At high risk for falls 05/29/2019  . Chronic bilateral low back pain without sciatica 02/07/2019  . Statin intolerance 11/16/2018  . Hyperlipidemia, mixed 08/15/2018  . Low back pain 07/18/2018  . History of lacunar cerebrovascular accident (CVA) 02/12/2018  . Dizziness 02/12/2018  . Myofascial pain 12/18/2017  . Spondylosis without myelopathy or radiculopathy, lumbar region 12/18/2017  . Lumbar radiculopathy, right 12/18/2017  . Cholelithiasis 05/17/2017  . Sinus Bradycardia 05/17/2017  . Thoracic radiculopathy 04/12/2017  . Primary osteoarthritis of right knee 12/14/2016  . DDD (degenerative disc disease), lumbar 10/10/2016  . Lumbar facet arthropathy 08/29/2016  . Idiopathic scoliosis 03/22/2016  . Diabetic neuropathy ([place] 03/22/2016  . Dysautonomia orthostatic hypotension syndrome ([place] 02/25/2016  . CKD stage 3 due to type 2 diabetes mellitus ([place] 02/19/2016  . Coronary artery disease involving coronary bypass graft of native heart without angina pectoris   . Diabetes mellitus type 2, diet-controlled ([place]   . Intercostal  neuralgia 12/24/2015  .  Medication management 08/11/2015  . 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 PVD (peripheral vascular disease) ([place]) 01/01/2014  . Hyperlipidemia associated with type 2 diabetes mellitus ([place]) 04/11/2013  . Supine hypertension   . Vitamin D deficiency   . Vitamin B 12 deficiency   . Coronary atherosclerosis- s/p PCI to LAD in 2009 and PCI to RCA in 2011 02/27/2009  . Abdominal aortic aneurysm ([place]) 02/27/2009  . GERD 02/27/2009    [name], PT,DPT 07/05/2019, 1:27 PM  [facility] Physical Therapy [address] [place], [place], [address] Phone: [phone]   Fax:  [phone]  Name: [name] MRN: [id number] Date of Birth: [birth year]

## 2019-07-08 ENCOUNTER — Ambulatory Visit (INDEPENDENT_AMBULATORY_CARE_PROVIDER_SITE_OTHER): Payer: Medicare Other | Admitting: Physical Therapy

## 2019-07-08 ENCOUNTER — Other Ambulatory Visit: Payer: Self-pay

## 2019-07-08 DIAGNOSIS — R42 Dizziness and giddiness: Secondary | ICD-10-CM

## 2019-07-08 DIAGNOSIS — M6281 Muscle weakness (generalized): Secondary | ICD-10-CM

## 2019-07-08 DIAGNOSIS — M5441 Lumbago with sciatica, right side: Secondary | ICD-10-CM

## 2019-07-08 DIAGNOSIS — R29818 Other symptoms and signs involving the nervous system: Secondary | ICD-10-CM

## 2019-07-08 DIAGNOSIS — R2689 Other abnormalities of gait and mobility: Secondary | ICD-10-CM | POA: Diagnosis not present

## 2019-07-08 DIAGNOSIS — G8929 Other chronic pain: Secondary | ICD-10-CM

## 2019-07-08 NOTE — Therapy (Signed)
[facility] Physical Therapy [address] [place], [place], [address] Phone: [phone]   Fax:  [phone]  Physical Therapy Treatment  Patient Details  Name: [name] MRN: [id number] Date of Birth: December 01, 1933 Referring Provider (PT): [name], MD   Encounter Date: 07/08/2019  PT End of Session - 07/08/19 1125    Visit Number  15    Number of Visits  18    Date for PT Re-Evaluation  07/24/19    Authorization Type  UHC MCR    PT Start Time  1105    PT Stop Time  1145    PT Time Calculation (min)  40 min    Activity Tolerance  Patient tolerated treatment well    Behavior During Therapy  [place] for tasks assessed/performed       Past Medical History:  Diagnosis Date  . 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 Colon polyps   . Coronary atherosclerosis of unspecified type of vessel, native or graft   . Diabetes ([place])   . Difficult intubation   . 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 Esophageal reflux   . High cholesterol   . History of IBS 02/27/2009  . Hypertension    pt denies, he says he has a h/o hypotension. If BP up he adjusts the Florinef  . Jejunitis with partial SBO 05/20/2017  . Orthostatic hypotension    "BP has been dropping alot when I stand up for the last month or so" (02/17/2016)  . Prostatitis   . Upper GI bleed 06/24/2018  . Vitamin B 12 deficiency   . Vitamin D deficiency     Past Surgical History:  Procedure Laterality Date  . BIOPSY  06/25/2018   Procedure: BIOPSY;  Surgeon: Rush Landmark Telford Nab., MD;  Location: [place];  Service: Gastroenterology;;  . BIOPSY  04/24/2019   Procedure: BIOPSY;  Surgeon: Irving Copas., MD;  Location[facility];  Service: Gastroenterology;;  . BLADDER SURGERY  1969   traumatic pelvic fractures, urethral and bladder repair  . CARDIAC CATHETERIZATION N/A 07/01/2015   Procedure: Left Heart Cath and Coronary Angiography;  Surgeon: Wellington Hampshire, MD;  Location: [place] [facility];  Service: Cardiovascular;  Laterality: N/A;  . COLON RESECTION N/A 05/17/2017   Procedure: DIAGNOSTIC LAPAROSCOPY,;  Surgeon: Leighton Ruff, MD;  [facility];  Service: General;  Laterality: N/A;  . COLONOSCOPY WITH PROPOFOL N/A 04/24/2019   Procedure: COLONOSCOPY WITH PROPOFOL;  Surgeon: Irving Copas., MD;  [facility];  Service: Gastroenterology;  Laterality: N/A;  . CORONARY ANGIOPLASTY WITH STENT PLACEMENT    . CORONARY ARTERY BYPASS GRAFT N/A 07/06/2015   Procedure: CORONARY ARTERY BYPASS GRAFTING (CABG)x 4   utilizing the left internal mammary artery and endoscopically harvested bilateral  sapheneous vein.;  Surgeon: Ivin Poot, MD;  Location: [place];  Service: Open Heart Surgery;  Laterality: N/A;  . ESOPHAGEAL DILATION  04/24/2019   Procedure: ESOPHAGEAL DILATION;  Surgeon: Rush Landmark Telford Nab., MD;  [facility];  Service: Gastroenterology;;  . ESOPHAGOGASTRODUODENOSCOPY (EGD) WITH PROPOFOL N/A 06/25/2018   Procedure: ESOPHAGOGASTRODUODENOSCOPY (EGD) WITH PROPOFOL;  Surgeon: Irving Copas., MD;  Location: [place];  Service: Gastroenterology;  Laterality: N/A;  . ESOPHAGOGASTRODUODENOSCOPY (EGD) WITH PROPOFOL N/A 04/24/2019   Procedure: ESOPHAGOGASTRODUODENOSCOPY (EGD) WITH PROPOFOL;  Surgeon: Rush Landmark Telford Nab., MD;  [facility];  Service: Gastroenterology;  Laterality: N/A;  . HEMOSTASIS CLIP PLACEMENT  04/24/2019   Procedure: HEMOSTASIS CLIP PLACEMENT;  Surgeon: Justice Britain  Brooke Bonito., MD;  Location: [facility];  Service: Gastroenterology;;  . KNEE SURGERY    . LOOP RECORDER INSERTION N/A 02/19/2018   Procedure: LOOP RECORDER INSERTION;  Surgeon: Deboraha Sprang, MD;  Location: [place] [facility];  Service: Cardiovascular;  Laterality: N/A;  . POLYPECTOMY  04/24/2019   Procedure: POLYPECTOMY;  Surgeon: Rush Landmark Telford Nab., MD;  Location: [facility];  Service: Gastroenterology;;  . Lia Foyer LIFTING  INJECTION  04/24/2019   Procedure: SUBMUCOSAL LIFTING INJECTION;  Surgeon: Irving Copas., MD;  [facility];  Service: Gastroenterology;;  . TEE WITHOUT CARDIOVERSION N/A 07/06/2015   Procedure: TRANSESOPHAGEAL ECHOCARDIOGRAM (TEE);  Surgeon: Ivin Poot, MD;  Location: [place];  Service: Open Heart Surgery;  Laterality: N/A;    There were no vitals filed for this visit.  Subjective Assessment - 07/08/19 1124    Subjective  relays a little knee pain today, but overall doing well    Pertinent History  PMH: CVA X5, DM, CKD, CAD, CABGX4,    Limitations  Lifting;Standing;Walking;House hold activities    How long can you stand comfortably?  2 min    How long can you walk comfortably?  household distances    Diagnostic tests  Imaging shows "Spinal stenosis of lumbar region with neurogenic claudication, advanced lumbar DDD, Scoliosis"    Patient Stated Goals  stand and walk better, improve leg strength and pain    Pain Onset  More than a month ago                       [facility] Adult PT Treatment/Exercise - 07/08/19 0001      Ambulation/Gait   Ambulation/Gait  Yes    Ambulation/Gait Assistance  5: Supervision    Ambulation Distance (Feet)  500 Feet    Assistive device  Rollator      Lumbar Exercises: Aerobic   Nustep  10 min L5 UE/LE      Knee/Hip Exercises: Standing   Heel Raises  Both;15 reps;Limitations    Heel Raises Limitations  UE support    Other Standing Knee Exercises  standing hip abd X 15 reps bilat 3 lb      Knee/Hip Exercises: Seated   Long Arc Quad  Both    Long Arc Quad Weight  3 lbs.    Long Arc Quad Limitations  1X10    Marching  Both    Marching Limitations  3 lbs 2X10    Hamstring Curl  Both;15 reps    Hamstring Limitations  L3 band    Sit to [employer]  3 sets;5 reps;with UE support                  PT Long Term Goals - 06/19/19 1140      PT LONG TERM GOAL #1   Title  independent with updated HEP (Target all LTGs  06/11/2019)    Time  6    Period  Weeks    Status  Achieved      PT LONG TERM GOAL #2   Title  Improve lumbar ROM to [facility] in standing (at least 60%).    Baseline  has the motion but still hurts    Time  6    Period  Weeks    Status  Partially Met      PT LONG TERM GOAL #3   Title  Improve bilat leg strength to at least 4+/5 MMT measured in sitting to improve function    Time  6    Period  Weeks    Status  On-going      PT LONG TERM GOAL #4   Title  Will improve 5TSTS to <25 sec to show improved leg strength and endurance. Revised on 06/19/19 to <20 seconds    Baseline  24 on sec on 2/17    Status  Achieved      PT LONG TERM GOAL #5   Title  Pt will be albe to stand and ambulate at least 10-15 minutes or 3000 ft with LRAD to show improved ability for limited community ambulation. Revised 2/17/2  to 625 ft    Time  6    Period  Weeks    Status  Achieved            Plan - 07/08/19 1128    Clinical Impression Statement  He was having some knee pain so held off of some of the knee strengthening exercises and worked more hip strength today as tolerated. Continue POC    Personal Factors and Comorbidities  Comorbidity 3+    Comorbidities  PMH: CVA X5, DM, CKD, CAD, CABGX4,    Examination-Activity Limitations  Bed Mobility;Bend;Carry;Squat;Stairs;Lift;Stand;Transfers;Locomotion Level    Examination-Participation Restrictions  Meal Prep;Cleaning;Community Activity;Shop    Stability/Clinical Decision Making  Evolving/Moderate complexity    PT Frequency  2x / week    PT Duration  6 weeks    PT Treatment/Interventions  ADLs/Self Care Home Management;Cryotherapy;Iontophoresis 56m/ml Dexamethasone;Moist Heat;Gait training;Stair training;Functional mobility training;Therapeutic activities;Therapeutic exercise;Balance training;Neuromuscular re-education;Manual techniques;Passive range of motion;Taping    PT Next Visit Plan  consider gait training with SPC needs bilat leg strength Rt is  weaker than Lt, needs balance, gait with rollator. lumbar stretching    PT Home Exercise Plan  Access Code: [email or web address]   Consulted and Agree with Plan of Care  Patient       Patient will benefit from skilled therapeutic intervention in order to improve the following deficits and impairments:  Abnormal gait, Decreased activity tolerance, Decreased balance, Decreased endurance, Decreased range of motion, Decreased strength, Hypomobility, Difficulty walking, Impaired flexibility, Postural dysfunction, Pain  Visit Diagnosis: Other abnormalities of gait and mobility  Chronic bilateral low back pain with right-sided sciatica  Muscle weakness (generalized)  Other symptoms and signs involving the nervous system  Dizziness and giddiness     Problem List Patient Active Problem List   Diagnosis Date Noted  . At high risk for falls 05/29/2019  . Chronic bilateral low back pain without sciatica 02/07/2019  . Statin intolerance 11/16/2018  . Hyperlipidemia, mixed 08/15/2018  . Low back pain 07/18/2018  . History of lacunar cerebrovascular accident (CVA) 02/12/2018  . Dizziness 02/12/2018  . Myofascial pain 12/18/2017  . Spondylosis without myelopathy or radiculopathy, lumbar region 12/18/2017  . Lumbar radiculopathy, right 12/18/2017  . Cholelithiasis 05/17/2017  . Sinus Bradycardia 05/17/2017  . Thoracic radiculopathy 04/12/2017  . Primary osteoarthritis of right knee 12/14/2016  . DDD (degenerative disc disease), lumbar 10/10/2016  . Lumbar facet arthropathy 08/29/2016  . Idiopathic scoliosis 03/22/2016  . Diabetic neuropathy ([place] 03/22/2016  . Dysautonomia orthostatic hypotension syndrome ([place] 02/25/2016  . CKD stage 3 due to type 2 diabetes mellitus ([place] 02/19/2016  . Coronary artery disease involving coronary bypass graft of native heart without angina pectoris   . Diabetes mellitus type 2, diet-controlled ([place]   . 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 Medication management  08/11/2015  . 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 PVD (peripheral vascular disease) ([place] 01/01/2014  .  Hyperlipidemia associated with type 2 diabetes mellitus ([place]) 04/11/2013  . Supine hypertension   . Vitamin D deficiency   . Vitamin B 12 deficiency   . Coronary atherosclerosis- s/p PCI to LAD in 2009 and PCI to RCA in 2011 02/27/2009  . Abdominal aortic aneurysm ([place]) 02/27/2009  . GERD 02/27/2009    [name], PT,DPT 07/08/2019, 11:49 AM  [facility] Physical Therapy [address] [place], [place], [address] Phone: [phone]   Fax:  [phone]  Name: [name] MRN: [id number] Date of Birth: 1934/02/10

## 2019-07-10 ENCOUNTER — Other Ambulatory Visit: Payer: Self-pay

## 2019-07-10 ENCOUNTER — Ambulatory Visit (INDEPENDENT_AMBULATORY_CARE_PROVIDER_SITE_OTHER): Payer: Medicare Other | Admitting: Physical Therapy

## 2019-07-10 DIAGNOSIS — R42 Dizziness and giddiness: Secondary | ICD-10-CM

## 2019-07-10 DIAGNOSIS — M6281 Muscle weakness (generalized): Secondary | ICD-10-CM

## 2019-07-10 DIAGNOSIS — R29818 Other symptoms and signs involving the nervous system: Secondary | ICD-10-CM

## 2019-07-10 DIAGNOSIS — M5441 Lumbago with sciatica, right side: Secondary | ICD-10-CM | POA: Diagnosis not present

## 2019-07-10 DIAGNOSIS — R2689 Other abnormalities of gait and mobility: Secondary | ICD-10-CM | POA: Diagnosis not present

## 2019-07-10 DIAGNOSIS — G8929 Other chronic pain: Secondary | ICD-10-CM

## 2019-07-10 NOTE — Therapy (Signed)
[facility] Physical Therapy [address] [place], [place], [address] Phone: [phone]   Fax:  [phone]  Physical Therapy Treatment  Patient Details  Name: [name] MRN: [id number] Date of Birth: October 24, 1933 Referring Provider (PT): [name], MD   Encounter Date: 07/10/2019  PT End of Session - 07/10/19 1125    Visit Number  16    Number of Visits  18    Date for PT Re-Evaluation  07/24/19    Authorization Type  UHC MCR    PT Start Time  1100    PT Stop Time  1140    PT Time Calculation (min)  40 min    Activity Tolerance  Patient tolerated treatment well    Behavior During Therapy  [facility] for tasks assessed/performed       Past Medical History:  Diagnosis Date  . 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 Colon polyps   . Coronary atherosclerosis of unspecified type of vessel, native or graft   . Diabetes ([place])   . Difficult intubation   . 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 Esophageal reflux   . High cholesterol   . History of IBS 02/27/2009  . Hypertension    pt denies, he says he has a h/o hypotension. If BP up he adjusts the Florinef  . Jejunitis with partial SBO 05/20/2017  . Orthostatic hypotension    "BP has been dropping alot when I stand up for the last month or so" (02/17/2016)  . Prostatitis   . Upper GI bleed 06/24/2018  . Vitamin B 12 deficiency   . Vitamin D deficiency     Past Surgical History:  Procedure Laterality Date  . BIOPSY  06/25/2018   Procedure: BIOPSY;  Surgeon: Rush Landmark Telford Nab., MD;  Location: [place];  Service: Gastroenterology;;  . BIOPSY  04/24/2019   Procedure: BIOPSY;  Surgeon: Irving Copas., MD;  Location: [facility];  Service: Gastroenterology;;  . BLADDER SURGERY  1969   traumatic pelvic fractures, urethral and bladder repair  . CARDIAC CATHETERIZATION N/A 07/01/2015   Procedure: Left Heart Cath and Coronary Angiography;  Surgeon: Wellington Hampshire, MD;  Location: [place] [facility];  Service: Cardiovascular;  Laterality: N/A;  . COLON RESECTION N/A 05/17/2017   Procedure: DIAGNOSTIC LAPAROSCOPY,;  Surgeon: Leighton Ruff, MD;  [facility];  Service: General;  Laterality: N/A;  . COLONOSCOPY WITH PROPOFOL N/A 04/24/2019   Procedure: COLONOSCOPY WITH PROPOFOL;  Surgeon: Irving Copas., MD;  [facility];  Service: Gastroenterology;  Laterality: N/A;  . CORONARY ANGIOPLASTY WITH STENT PLACEMENT    . CORONARY ARTERY BYPASS GRAFT N/A 07/06/2015   Procedure: CORONARY ARTERY BYPASS GRAFTING (CABG)x 4   utilizing the left internal mammary artery and endoscopically harvested bilateral  sapheneous vein.;  Surgeon: Ivin Poot, MD;  Location: [place];  Service: Open Heart Surgery;  Laterality: N/A;  . ESOPHAGEAL DILATION  04/24/2019   Procedure: ESOPHAGEAL DILATION;  Surgeon: Rush Landmark Telford Nab., MD;  [facility];  Service: Gastroenterology;;  . ESOPHAGOGASTRODUODENOSCOPY (EGD) WITH PROPOFOL N/A 06/25/2018   Procedure: ESOPHAGOGASTRODUODENOSCOPY (EGD) WITH PROPOFOL;  Surgeon: Irving Copas., MD;  Location: [place];  Service: Gastroenterology;  Laterality: N/A;  . ESOPHAGOGASTRODUODENOSCOPY (EGD) WITH PROPOFOL N/A 04/24/2019   Procedure: ESOPHAGOGASTRODUODENOSCOPY (EGD) WITH PROPOFOL;  Surgeon: Rush Landmark Telford Nab., MD;  [facility];  Service: Gastroenterology;  Laterality: N/A;  . HEMOSTASIS CLIP PLACEMENT  04/24/2019   Procedure: HEMOSTASIS CLIP PLACEMENT;  Surgeon: Justice Britain  Brooke Bonito., MD;  Location: [facility];  Service: Gastroenterology;;  . KNEE SURGERY    . LOOP RECORDER INSERTION N/A 02/19/2018   Procedure: LOOP RECORDER INSERTION;  Surgeon: Deboraha Sprang, MD;  Location: [place] [facility];  Service: Cardiovascular;  Laterality: N/A;  . POLYPECTOMY  04/24/2019   Procedure: POLYPECTOMY;  Surgeon: Rush Landmark Telford Nab., MD;  Location[facility];  Service: Gastroenterology;;  . Lia Foyer LIFTING  INJECTION  04/24/2019   Procedure: SUBMUCOSAL LIFTING INJECTION;  Surgeon: Irving Copas., MD;  [facility];  Service: Gastroenterology;;  . TEE WITHOUT CARDIOVERSION N/A 07/06/2015   Procedure: TRANSESOPHAGEAL ECHOCARDIOGRAM (TEE);  Surgeon: Ivin Poot, MD;  Location: [place];  Service: Open Heart Surgery;  Laterality: N/A;    There were no vitals filed for this visit.  Subjective Assessment - 07/10/19 1120    Subjective  relays more dizziness today and just feels tired, relays he has had breakfast however.    Pertinent History  PMH: CVA X5, DM, CKD, CAD, CABGX4,    Limitations  Lifting;Standing;Walking;House hold activities    How long can you stand comfortably?  2 min    How long can you walk comfortably?  household distances    Diagnostic tests  Imaging shows "Spinal stenosis of lumbar region with neurogenic claudication, advanced lumbar DDD, Scoliosis"    Patient Stated Goals  stand and walk better, improve leg strength and pain    Pain Onset  More than a month ago                       [facility] Adult PT Treatment/Exercise - 07/10/19 0001      Ambulation/Gait   Ambulation/Gait  Yes    Ambulation/Gait Assistance  5: Supervision    Ambulation Distance (Feet)  500 Feet   needed one seated rest break today   Assistive device  Rollator      Lumbar Exercises: Aerobic   Nustep  10 min L5 UE/LE      Knee/Hip Exercises: Machines for Strengthening   Cybex Leg Press  81 lbs 3x10      Knee/Hip Exercises: Standing   Forward Step Up Limitations   X15 reps bilat with UE support onto 4 inch step at treadmil rail      Knee/Hip Exercises: Seated   Sit to [place]  3 sets;5 reps;with UE support                  PT Long Term Goals - 06/19/19 1140      PT LONG TERM GOAL #1   Title  independent with updated HEP (Target all LTGs 06/11/2019)    Time  6    Period  Weeks    Status  Achieved      PT LONG TERM GOAL #2   Title  Improve lumbar ROM to  [facility] in standing (at least 60%).    Baseline  has the motion but still hurts    Time  6    Period  Weeks    Status  Partially Met      PT LONG TERM GOAL #3   Title  Improve bilat leg strength to at least 4+/5 MMT measured in sitting to improve function    Time  6    Period  Weeks    Status  On-going      PT LONG TERM GOAL #4   Title  Will improve 5TSTS to <25 sec to show improved leg strength and  endurance. Revised on 06/19/19 to <20 seconds    Baseline  24 on sec on 2/17    Status  Achieved      PT LONG TERM GOAL #5   Title  Pt will be albe to stand and ambulate at least 10-15 minutes or 3000 ft with LRAD to show improved ability for limited community ambulation. Revised 2/17/2  to 625 ft    Time  6    Period  Weeks    Status  Achieved            Plan - 07/10/19 1133    Clinical Impression Statement  He has made good overall progress with PT, plan to update and progress his HEP then discharge next week.    Personal Factors and Comorbidities  Comorbidity 3+    Comorbidities  PMH: CVA X5, DM, CKD, CAD, CABGX4,    Examination-Activity Limitations  Bed Mobility;Bend;Carry;Squat;Stairs;Lift;Stand;Transfers;Locomotion Level    Examination-Participation Restrictions  Meal Prep;Cleaning;Community Activity;Shop    Stability/Clinical Decision Making  Evolving/Moderate complexity    PT Frequency  2x / week    PT Duration  6 weeks    PT Treatment/Interventions  ADLs/Self Care Home Management;Cryotherapy;Iontophoresis 48m/ml Dexamethasone;Moist Heat;Gait training;Stair training;Functional mobility training;Therapeutic activities;Therapeutic exercise;Balance training;Neuromuscular re-education;Manual techniques;Passive range of motion;Taping    PT Next Visit Plan  consider gait training with SPC needs bilat leg strength Rt is weaker than Lt, needs balance, gait with rollator. lumbar stretching    PT Home Exercise Plan  Access Code: [email or web address]   Consulted and Agree with Plan of Care  Patient        Patient will benefit from skilled therapeutic intervention in order to improve the following deficits and impairments:  Abnormal gait, Decreased activity tolerance, Decreased balance, Decreased endurance, Decreased range of motion, Decreased strength, Hypomobility, Difficulty walking, Impaired flexibility, Postural dysfunction, Pain  Visit Diagnosis: Other abnormalities of gait and mobility  Chronic bilateral low back pain with right-sided sciatica  Muscle weakness (generalized)  Other symptoms and signs involving the nervous system  Dizziness and giddiness     Problem List Patient Active Problem List   Diagnosis Date Noted  . At high risk for falls 05/29/2019  . Chronic bilateral low back pain without sciatica 02/07/2019  . Statin intolerance 11/16/2018  . Hyperlipidemia, mixed 08/15/2018  . Low back pain 07/18/2018  . History of lacunar cerebrovascular accident (CVA) 02/12/2018  . Dizziness 02/12/2018  . Myofascial pain 12/18/2017  . Spondylosis without myelopathy or radiculopathy, lumbar region 12/18/2017  . Lumbar radiculopathy, right 12/18/2017  . Cholelithiasis 05/17/2017  . Sinus Bradycardia 05/17/2017  . Thoracic radiculopathy 04/12/2017  . Primary osteoarthritis of right knee 12/14/2016  . DDD (degenerative disc disease), lumbar 10/10/2016  . Lumbar facet arthropathy 08/29/2016  . Idiopathic scoliosis 03/22/2016  . Diabetic neuropathy ([place] 03/22/2016  . Dysautonomia orthostatic hypotension syndrome ([place] 02/25/2016  . CKD stage 3 due to type 2 diabetes mellitus ([place] 02/19/2016  . Coronary artery disease involving coronary bypass graft of native heart without angina pectoris   . Diabetes mellitus type 2, diet-controlled ([place]   . 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 Medication management 08/11/2015  . 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 PVD (peripheral vascular disease) ([place] 01/01/2014  . Hyperlipidemia associated with type 2 diabetes mellitus ([place] 04/11/2013   . Supine hypertension   . Vitamin D deficiency   . Vitamin B 12 deficiency   . Coronary atherosclerosis- s/p PCI to LAD in 2009 and PCI to RCA in 2011 02/27/2009  .  Abdominal aortic aneurysm ([place]) 02/27/2009  . GERD 02/27/2009    [name], PT,DPT 07/10/2019, 11:42 AM  [facility] Physical Therapy [address] [place], [place], [address] Phone: [phone]   Fax:  [phone]  Name: HANS RUSHER MRN: [id number] Date of Birth: [birth year]

## 2019-07-15 ENCOUNTER — Other Ambulatory Visit: Payer: Self-pay

## 2019-07-15 ENCOUNTER — Ambulatory Visit: Payer: Medicare Other | Admitting: Physical Therapy

## 2019-07-15 DIAGNOSIS — R42 Dizziness and giddiness: Secondary | ICD-10-CM

## 2019-07-15 DIAGNOSIS — R2689 Other abnormalities of gait and mobility: Secondary | ICD-10-CM

## 2019-07-15 DIAGNOSIS — M6281 Muscle weakness (generalized): Secondary | ICD-10-CM | POA: Diagnosis not present

## 2019-07-15 DIAGNOSIS — M5441 Lumbago with sciatica, right side: Secondary | ICD-10-CM

## 2019-07-15 DIAGNOSIS — R29818 Other symptoms and signs involving the nervous system: Secondary | ICD-10-CM | POA: Diagnosis not present

## 2019-07-15 DIAGNOSIS — G8929 Other chronic pain: Secondary | ICD-10-CM

## 2019-07-15 NOTE — Patient Instructions (Signed)
Access Code: [email or web address]: [email or web address]: 03/15/2021Prepared by: Aaron Edelman NelsonExercises  Supine Single Knee to Chest - 2 x daily - 6 x weekly - 3 sets - 30 hold  Supine Bridge - 2 x daily - 10 reps - 2 sets  Supine Active Straight Leg Raise - 2 x daily - 6 x weekly - 10 reps - 1-2 sets  Seated March - 2 x daily - 6 x weekly - 10 reps - 2-3 sets  Seated Knee Extension with Resistance - 2 x daily - 6 x weekly - 10 reps - 3 sets  Sit to Stand with Armchair - 2 x daily - 6 x weekly - 10 reps - 1-2 sets  Mini Squat with Counter Support - 2 x daily - 6 x weekly - 10 reps - 1-2 sets  Standing Hip Abduction with Counter Support - 2 x daily - 6 x weekly - 10 reps - 1-2 sets  Standing Single Leg Stance with Counter Support - 2 x daily - 6 x weekly - 3 reps - 1 sets - 30 sec hold  Heel Toe Raises with Counter Support - 2 x daily - 6 x weekly - 10 reps - 2 sets

## 2019-07-15 NOTE — Therapy (Signed)
[facility] [address] [place], [place], [address] Phone: [phone]   Fax:  [phone]  Physical Therapy Treatment  Patient Details  Name: [name] MRN: [id number] Date of Birth: [birth year] Referring Provider (PT): [name], MD   Encounter Date: 07/15/2019  PT End of Session - 07/15/19 1144    Visit Number  17    Number of Visits  18    Date for PT Re-Evaluation  07/24/19    Authorization Type  UHC MCR    PT Start Time  1102    PT Stop Time  1140    PT Time Calculation (min)  38 min    Activity Tolerance  Patient tolerated treatment well    Behavior During Therapy  [facility] for tasks assessed/performed       Past Medical History:  Diagnosis Date  . 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 Colon polyps   . Coronary atherosclerosis of unspecified type of vessel, native or graft   . Diabetes ([place])   . Difficult intubation   . 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 Esophageal reflux   . High cholesterol   . History of IBS 02/27/2009  . Hypertension    pt denies, he says he has a h/o hypotension. If BP up he adjusts the Florinef  . Jejunitis with partial SBO 05/20/2017  . Orthostatic hypotension    "BP has been dropping alot when I stand up for the last month or so" (02/17/2016)  . Prostatitis   . Upper GI bleed 06/24/2018  . Vitamin B 12 deficiency   . Vitamin D deficiency     Past Surgical History:  Procedure Laterality Date  . BIOPSY  06/25/2018   Procedure: BIOPSY;  Surgeon: Rush Landmark Telford Nab., MD;  Location: [place];  Service: Gastroenterology;;  . BIOPSY  04/24/2019   Procedure: BIOPSY;  Surgeon: Irving Copas., MD;  Location[facility];  Service: Gastroenterology;;  . BLADDER SURGERY  1969   traumatic pelvic fractures, urethral and bladder repair  . CARDIAC CATHETERIZATION N/A 07/01/2015   Procedure: Left Heart Cath and Coronary Angiography;  Surgeon: Wellington Hampshire, MD;  Location: [place] [facility];  Service: Cardiovascular;  Laterality: N/A;  . COLON RESECTION N/A 05/17/2017   Procedure: DIAGNOSTIC LAPAROSCOPY,;  Surgeon: Leighton Ruff, MD;  [facility];  Service: General;  Laterality: N/A;  . COLONOSCOPY WITH PROPOFOL N/A 04/24/2019   Procedure: COLONOSCOPY WITH PROPOFOL;  Surgeon: Irving Copas., MD;  [facility];  Service: Gastroenterology;  Laterality: N/A;  . CORONARY ANGIOPLASTY WITH STENT PLACEMENT    . CORONARY ARTERY BYPASS GRAFT N/A 07/06/2015   Procedure: CORONARY ARTERY BYPASS GRAFTING (CABG)x 4   utilizing the left internal mammary artery and endoscopically harvested bilateral  sapheneous vein.;  Surgeon: Ivin Poot, MD;  Location: [facility];  Service: Open Heart Surgery;  Laterality: N/A;  . ESOPHAGEAL DILATION  04/24/2019   Procedure: ESOPHAGEAL DILATION;  Surgeon: Rush Landmark Telford Nab., MD;  [facility];  Service: Gastroenterology;;  . ESOPHAGOGASTRODUODENOSCOPY (EGD) WITH PROPOFOL N/A 06/25/2018   Procedure: ESOPHAGOGASTRODUODENOSCOPY (EGD) WITH PROPOFOL;  Surgeon: Irving Copas., MD;  Location: [place];  Service: Gastroenterology;  Laterality: N/A;  . ESOPHAGOGASTRODUODENOSCOPY (EGD) WITH PROPOFOL N/A 04/24/2019   Procedure: ESOPHAGOGASTRODUODENOSCOPY (EGD) WITH PROPOFOL;  Surgeon: Rush Landmark Telford Nab., MD;  [facility];  Service: Gastroenterology;  Laterality: N/A;  . HEMOSTASIS CLIP PLACEMENT  04/24/2019   Procedure: HEMOSTASIS CLIP PLACEMENT;  Surgeon: Justice Britain  Brooke Bonito., MD;  Location: [facility];  Service: Gastroenterology;;  . KNEE SURGERY    . LOOP RECORDER INSERTION N/A 02/19/2018   Procedure: LOOP RECORDER INSERTION;  Surgeon: Deboraha Sprang, MD;  Location: [facility] [facility];  Service: Cardiovascular;  Laterality: N/A;  . POLYPECTOMY  04/24/2019   Procedure: POLYPECTOMY;  Surgeon: Rush Landmark Telford Nab., MD;  Location: [facility];  Service: Gastroenterology;;  . Lia Foyer LIFTING  INJECTION  04/24/2019   Procedure: SUBMUCOSAL LIFTING INJECTION;  Surgeon: Irving Copas., MD;  [facility];  Service: Gastroenterology;;  . TEE WITHOUT CARDIOVERSION N/A 07/06/2015   Procedure: TRANSESOPHAGEAL ECHOCARDIOGRAM (TEE);  Surgeon: Ivin Poot, MD;  Location: [facility];  Service: Open Heart Surgery;  Laterality: N/A;    There were no vitals filed for this visit.  Subjective Assessment - 07/15/19 1143    Subjective  relays dizziness is better today, legs feel sore. He still feels ready to discharge to HEP next visit    Pertinent History  PMH: CVA X5, DM, CKD, CAD, CABGX4,    Limitations  Lifting;Standing;Walking;House hold activities    How long can you stand comfortably?  2 min    How long can you walk comfortably?  household distances    Diagnostic tests  Imaging shows "Spinal stenosis of lumbar region with neurogenic claudication, advanced lumbar DDD, Scoliosis"    Patient Stated Goals  stand and walk better, improve leg strength and pain    Pain Onset  More than a month ago                       [facility] Adult PT Treatment/Exercise - 07/15/19 0001      Ambulation/Gait   Ambulation/Gait Assistance  5: Supervision    Ambulation Distance (Feet)  500 Feet    Assistive device  Rollator      Lumbar Exercises: Aerobic   Nustep  10 min L7 UE/LE      Knee/Hip Exercises: Standing   Heel Raises Limitations  UE support, H/T raises X 15 bilat    Hip Abduction  Stengthening;Knee straight;15 reps    Other Standing Knee Exercises  mini squats X20 reps with chair behind him and UE support      Knee/Hip Exercises: Seated   Sit to Sand  3 sets;5 reps;with UE support             PT Education - 07/15/19 1144    Education Details  HEP progression    Person(s) Educated  Patient    Methods  Explanation;Demonstration;Verbal cues;Handout    Comprehension  Verbalized understanding          PT Long Term Goals - 06/19/19 1140      PT LONG TERM  GOAL #1   Title  independent with updated HEP (Target all LTGs 06/11/2019)    Time  6    Period  Weeks    Status  Achieved      PT LONG TERM GOAL #2   Title  Improve lumbar ROM to [facility] in standing (at least 60%).    Baseline  has the motion but still hurts    Time  6    Period  Weeks    Status  Partially Met      PT LONG TERM GOAL #3   Title  Improve bilat leg strength to at least 4+/5 MMT measured in sitting to improve function    Time  6    Period  Weeks  Status  On-going      PT LONG TERM GOAL #4   Title  Will improve 5TSTS to <25 sec to show improved leg strength and endurance. Revised on 06/19/19 to <20 seconds    Baseline  24 on sec on 2/17    Status  Achieved      PT LONG TERM GOAL #5   Title  Pt will be albe to stand and ambulate at least 10-15 minutes or 3000 ft with LRAD to show improved ability for limited community ambulation. Revised 2/17/2  to 625 ft    Time  6    Period  Weeks    Status  Achieved            Plan - 07/15/19 1145    Clinical Impression Statement  Updated HEP today to reflect his progress. He was able to return demo with good form and without quesitons. PT will finish up next visit.    Personal Factors and Comorbidities  Comorbidity 3+    Comorbidities  PMH: CVA X5, DM, CKD, CAD, CABGX4,    Examination-Activity Limitations  Bed Mobility;Bend;Carry;Squat;Stairs;Lift;Stand;Transfers;Locomotion Level    Examination-Participation Restrictions  Meal Prep;Cleaning;Community Activity;Shop    Stability/Clinical Decision Making  Evolving/Moderate complexity    PT Frequency  2x / week    PT Duration  6 weeks    PT Treatment/Interventions  ADLs/Self Care Home Management;Cryotherapy;Iontophoresis 92m/ml Dexamethasone;Moist Heat;Gait training;Stair training;Functional mobility training;Therapeutic activities;Therapeutic exercise;Balance training;Neuromuscular re-education;Manual techniques;Passive range of motion;Taping    PT Next Visit Plan  DC    PT  Home Exercise Plan  [facility]   Consulted and Agree with Plan of Care  Patient       Patient will benefit from skilled therapeutic intervention in order to improve the following deficits and impairments:  Abnormal gait, Decreased activity tolerance, Decreased balance, Decreased endurance, Decreased range of motion, Decreased strength, Hypomobility, Difficulty walking, Impaired flexibility, Postural dysfunction, Pain  Visit Diagnosis: Other abnormalities of gait and mobility  Chronic bilateral low back pain with right-sided sciatica  Muscle weakness (generalized)  Other symptoms and signs involving the nervous system  Dizziness and giddiness     Problem List Patient Active Problem List   Diagnosis Date Noted  . At high risk for falls 05/29/2019  . Chronic bilateral low back pain without sciatica 02/07/2019  . Statin intolerance 11/16/2018  . Hyperlipidemia, mixed 08/15/2018  . Low back pain 07/18/2018  . History of lacunar cerebrovascular accident (CVA) 02/12/2018  . Dizziness 02/12/2018  . Myofascial pain 12/18/2017  . Spondylosis without myelopathy or radiculopathy, lumbar region 12/18/2017  . Lumbar radiculopathy, right 12/18/2017  . Cholelithiasis 05/17/2017  . Sinus Bradycardia 05/17/2017  . Thoracic radiculopathy 04/12/2017  . Primary osteoarthritis of right knee 12/14/2016  . DDD (degenerative disc disease), lumbar 10/10/2016  . Lumbar facet arthropathy 08/29/2016  . Idiopathic scoliosis 03/22/2016  . Diabetic neuropathy ([place] 03/22/2016  . Dysautonomia orthostatic hypotension syndrome ([place] 02/25/2016  . CKD stage 3 due to type 2 diabetes mellitus ([place] 02/19/2016  . Coronary artery disease involving coronary bypass graft of native heart without angina pectoris   . Diabetes mellitus type 2, diet-controlled ([place]   . 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 Medication management 08/11/2015  . 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 PVD (peripheral vascular disease)  ([place] 01/01/2014  . Hyperlipidemia associated with type 2 diabetes mellitus ([place] 04/11/2013  . Supine hypertension   . Vitamin D deficiency   . Vitamin B 12 deficiency   .  Coronary atherosclerosis- s/p PCI to LAD in 2009 and PCI to RCA in 2011 02/27/2009  . Abdominal aortic aneurysm ([place]) 02/27/2009  . GERD 02/27/2009    [name] , PT,DPT 07/15/2019, 11:53 AM  [facility] [address] [place], [place], [address] Phone: [phone]   Fax:  [phone]  Name: [name] MRN: [id number] Date of Birth: 1933-11-12

## 2019-07-17 ENCOUNTER — Other Ambulatory Visit: Payer: Self-pay

## 2019-07-17 ENCOUNTER — Ambulatory Visit (INDEPENDENT_AMBULATORY_CARE_PROVIDER_SITE_OTHER): Payer: Medicare Other | Admitting: Physical Therapy

## 2019-07-17 DIAGNOSIS — G8929 Other chronic pain: Secondary | ICD-10-CM

## 2019-07-17 DIAGNOSIS — M6281 Muscle weakness (generalized): Secondary | ICD-10-CM | POA: Diagnosis not present

## 2019-07-17 DIAGNOSIS — M5441 Lumbago with sciatica, right side: Secondary | ICD-10-CM | POA: Diagnosis not present

## 2019-07-17 DIAGNOSIS — R29818 Other symptoms and signs involving the nervous system: Secondary | ICD-10-CM

## 2019-07-17 DIAGNOSIS — R2689 Other abnormalities of gait and mobility: Secondary | ICD-10-CM | POA: Diagnosis not present

## 2019-07-17 NOTE — Therapy (Signed)
[facility] [address] [place], [place], [address] Phone: [phone]   Fax:  [phone]  Physical Therapy Treatment/Discharge PHYSICAL THERAPY DISCHARGE SUMMARY  Visits from Start of Care: 18  Current functional level related to goals / functional outcomes: See below   Remaining deficits: Still has some endurance and leg weakness but can now ambulate around with rolling walker and no longer needs wheelchair for locomotion   Education / Equipment: HEP Plan: Patient agrees to discharge.  Patient goals were met. Patient is being discharged due to meeting the stated rehab goals.  ?????        Patient Details  Name: [name] MRN: [id number] Date of Birth: 1934/02/23 Referring Provider (PT): [name], MD   Encounter Date: 07/17/2019  PT End of Session - 07/17/19 1123    Visit Number  18    Number of Visits  18    Date for PT Re-Evaluation  07/24/19    Authorization Type  [employer]HC MCR    PT Start Time  1100    PT Stop Time  1138    PT Time Calculation (min)  38 min    Activity Tolerance  Patient tolerated treatment well    Behavior During Therapy  WFL for tasks assessed/performed       Past Medical History:  Diagnosis Date  . 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 Colon polyps   . Coronary atherosclerosis of unspecified type of vessel, native or graft   . Diabetes ([place])   . Difficult intubation   . 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 Esophageal reflux   . High cholesterol   . History of IBS 02/27/2009  . Hypertension    pt denies, he says he has a h/o hypotension. If BP up he adjusts the Florinef  . Jejunitis with partial SBO 05/20/2017  . Orthostatic hypotension    "BP has been dropping alot when I stand up for the last month or so" (02/17/2016)  . Prostatitis   . Upper GI bleed 06/24/2018  . Vitamin B 12 deficiency   . Vitamin D deficiency     Past Surgical History:  Procedure Laterality Date  . BIOPSY   06/25/2018   Procedure: BIOPSY;  Surgeon: Rush Landmark Telford Nab., MD;  Location: [facility];  Service: Gastroenterology;;  . BIOPSY  04/24/2019   Procedure: BIOPSY;  Surgeon: Irving Copas., MD;  Location: [facility];  Service: Gastroenterology;;  . BLADDER SURGERY  1969   traumatic pelvic fractures, urethral and bladder repair  . CARDIAC CATHETERIZATION N/A 07/01/2015   Procedure: Left Heart Cath and Coronary Angiography;  Surgeon: Wellington Hampshire, MD;  Location: [place] [facility];  Service: Cardiovascular;  Laterality: N/A;  . COLON RESECTION N/A 05/17/2017   Procedure: DIAGNOSTIC LAPAROSCOPY,;  Surgeon: Leighton Ruff, MD;  [facility];  Service: General;  Laterality: N/A;  . COLONOSCOPY WITH PROPOFOL N/A 04/24/2019   Procedure: COLONOSCOPY WITH PROPOFOL;  Surgeon: Irving Copas., MD;  [facility];  Service: Gastroenterology;  Laterality: N/A;  . CORONARY ANGIOPLASTY WITH STENT PLACEMENT    . CORONARY ARTERY BYPASS GRAFT N/A 07/06/2015   Procedure: CORONARY ARTERY BYPASS GRAFTING (CABG)x 4   utilizing the left internal mammary artery and endoscopically harvested bilateral  sapheneous vein.;  Surgeon: Ivin Poot, MD;  Location: [facility];  Service: Open Heart Surgery;  Laterality: N/A;  . ESOPHAGEAL DILATION  04/24/2019   Procedure: ESOPHAGEAL DILATION;  Surgeon: Rush Landmark Telford Nab., MD;  [facility];  Service: Gastroenterology;;  . ESOPHAGOGASTRODUODENOSCOPY (EGD) WITH PROPOFOL N/A 06/25/2018   Procedure: ESOPHAGOGASTRODUODENOSCOPY (EGD) WITH PROPOFOL;  Surgeon: Rush Landmark Telford Nab., MD;  Location: [facility];  Service: Gastroenterology;  Laterality: N/A;  . ESOPHAGOGASTRODUODENOSCOPY (EGD) WITH PROPOFOL N/A 04/24/2019   Procedure: ESOPHAGOGASTRODUODENOSCOPY (EGD) WITH PROPOFOL;  Surgeon: Rush Landmark Telford Nab., MD;  [facility];  Service: Gastroenterology;  Laterality: N/A;  . HEMOSTASIS CLIP PLACEMENT  04/24/2019    Procedure: HEMOSTASIS CLIP PLACEMENT;  Surgeon: Irving Copas., MD;  [facility];  Service: Gastroenterology;;  . KNEE SURGERY    . LOOP RECORDER INSERTION N/A 02/19/2018   Procedure: LOOP RECORDER INSERTION;  Surgeon: Deboraha Sprang, MD;  Location: [place] [facility];  Service: Cardiovascular;  Laterality: N/A;  . POLYPECTOMY  04/24/2019   Procedure: POLYPECTOMY;  Surgeon: Rush Landmark Telford Nab., MD;  [facility];  Service: Gastroenterology;;  . Lia Foyer LIFTING INJECTION  04/24/2019   Procedure: SUBMUCOSAL LIFTING INJECTION;  Surgeon: Irving Copas., MD;  [facility];  Service: Gastroenterology;;  . TEE WITHOUT CARDIOVERSION N/A 07/06/2015   Procedure: TRANSESOPHAGEAL ECHOCARDIOGRAM (TEE);  Surgeon: Ivin Poot, MD;  Location: [place];  Service: Open Heart Surgery;  Laterality: N/A;    There were no vitals filed for this visit.  Subjective Assessment - 07/17/19 1114    Subjective  relays dizzy upon arrival, he has already checked his BP and was good. He does say the dizziness improves with some exericise and he still feels ready to discharge today.    Pertinent History  PMH: CVA X5, DM, CKD, CAD, CABGX4,    Limitations  Lifting;Standing;Walking;House hold activities    How long can you stand comfortably?  2 min    How long can you walk comfortably?  household distances    Diagnostic tests  Imaging shows "Spinal stenosis of lumbar region with neurogenic claudication, advanced lumbar DDD, Scoliosis"    Patient Stated Goals  stand and walk better, improve leg strength and pain    Pain Onset  More than a month ago         [facility] PT Assessment - 07/17/19 0001      Assessment   Medical Diagnosis  Spinal stenosis of lumbar region with neurogenic claudication, advanced lumbar DDD, Scoliosis, Rt leg weakness.    Referring Provider (PT)  [name], MD      AROM   Overall AROM Comments  lumbar ROM WFL but still painful at times       Strength   Overall Strength Comments  overall hip strength 4+/5, Rt knee strength 4+/4, Lt knee strength 5/5      Ambulation/Gait   Ambulation/Gait Assistance  6: Modified independent (Device/Increase time)    Ambulation Distance (Feet)  625 Feet    Assistive device  Rollator                   [facility] Adult PT Treatment/Exercise - 07/17/19 0001      Lumbar Exercises: Aerobic   Recumbent Bike  8 min      Knee/Hip Exercises: Machines for Strengthening   Cybex Leg Press  75 lbs 2X20 reps      Knee/Hip Exercises: Standing   Other Standing Knee Exercises  march walking, lateral walking, retro walking with bilat UE support in bars up/down X 3 reps ea      Knee/Hip Exercises: Seated   Sit to Sand  3 sets;5 reps;with UE support  PT Long Term Goals - 07/17/19 1123      PT LONG TERM GOAL #1   Title  independent with updated HEP (Target all LTGs 06/11/2019)    Time  6    Period  Weeks    Status  Achieved      PT LONG TERM GOAL #2   Title  Improve lumbar ROM to [facility] in standing (at least 60%).    Baseline  has the motion but still hurts    Time  6    Period  Weeks    Status  Achieved      PT LONG TERM GOAL #3   Title  Improve bilat leg strength to at least 4+/5 MMT measured in sitting to improve function    Time  6    Period  Weeks    Status  Achieved      PT LONG TERM GOAL #4   Title  Will improve 5TSTS to <25 sec to show improved leg strength and endurance. Revised on 06/19/19 to <20 seconds    Baseline  24 on sec on 2/17    Status  Achieved      PT LONG TERM GOAL #5   Title  Ambulate with LRAD mod I  for 625 ft or stand at least 10 min    Time  6    Period  Weeks    Status  Achieved            Plan - 07/17/19 1149    Clinical Impression Statement  He has made great progress with PT and has met all of his PT goals and feels ready for discharge. He had no further quesions or concerns.    PT Next Visit Plan  DC    PT Home  Exercise Plan  Access Code: [facility]    Consulted and Agree with Plan of Care  Patient       Patient will benefit from skilled therapeutic intervention in order to improve the following deficits and impairments:     Visit Diagnosis: Other abnormalities of gait and mobility  Chronic bilateral low back pain with right-sided sciatica  Muscle weakness (generalized)  Other symptoms and signs involving the nervous system     Problem List Patient Active Problem List   Diagnosis Date Noted  . At high risk for falls 05/29/2019  . Chronic bilateral low back pain without sciatica 02/07/2019  . Statin intolerance 11/16/2018  . Hyperlipidemia, mixed 08/15/2018  . Low back pain 07/18/2018  . History of lacunar cerebrovascular accident (CVA) 02/12/2018  . Dizziness 02/12/2018  . Myofascial pain 12/18/2017  . Spondylosis without myelopathy or radiculopathy, lumbar region 12/18/2017  . Lumbar radiculopathy, right 12/18/2017  . Cholelithiasis 05/17/2017  . Sinus Bradycardia 05/17/2017  . Thoracic radiculopathy 04/12/2017  . Primary osteoarthritis of right knee 12/14/2016  . DDD (degenerative disc disease), lumbar 10/10/2016  . Lumbar facet arthropathy 08/29/2016  . Idiopathic scoliosis 03/22/2016  . Diabetic neuropathy ([place]) 03/22/2016  . Dysautonomia orthostatic hypotension syndrome ([place]) 02/25/2016  . CKD stage 3 due to type 2 diabetes mellitus ([place]) 02/19/2016  . Coronary artery disease involving coronary bypass graft of native heart without angina pectoris   . Diabetes mellitus type 2, diet-controlled ([place])   . 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 Medication management 08/11/2015  . 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 PVD (peripheral vascular disease) ([place]) 01/01/2014  . Hyperlipidemia associated with type 2 diabetes mellitus ([place]) 04/11/2013  . Supine hypertension   .  Vitamin D deficiency   . Vitamin B 12 deficiency   . Coronary atherosclerosis- s/p PCI to LAD in 2009 and PCI to RCA in  2011 02/27/2009  . Abdominal aortic aneurysm ([place]) 02/27/2009  . GERD 02/27/2009    [name], PT,DPT  07/17/2019, 11:51 AM  [facility] [address] [place], [place], [address] Phone: [phone]   Fax:  [phone]  Name: SAMMY CASSAR MRN: [id number] Date of Birth: [birth year]

## 2019-07-18 ENCOUNTER — Ambulatory Visit: Payer: Medicare Other | Admitting: Adult Health

## 2019-07-18 ENCOUNTER — Other Ambulatory Visit: Payer: Self-pay

## 2019-07-18 ENCOUNTER — Ambulatory Visit: Payer: Medicare Other | Admitting: Internal Medicine

## 2019-07-18 VITALS — BP 156/78 | HR 68 | Temp 97.0°F | Resp 16 | Ht 72.0 in | Wt 192.8 lb

## 2019-07-18 DIAGNOSIS — R4189 Other symptoms and signs involving cognitive functions and awareness: Secondary | ICD-10-CM | POA: Diagnosis not present

## 2019-07-18 DIAGNOSIS — I10 Essential (primary) hypertension: Secondary | ICD-10-CM

## 2019-07-18 DIAGNOSIS — M940 Chondrocostal junction syndrome [Tietze]: Secondary | ICD-10-CM | POA: Diagnosis not present

## 2019-07-18 DIAGNOSIS — Z79899 Other long term (current) drug therapy: Secondary | ICD-10-CM

## 2019-07-18 DIAGNOSIS — R410 Disorientation, unspecified: Secondary | ICD-10-CM | POA: Diagnosis not present

## 2019-07-18 MED ORDER — DEXAMETHASONE 4 MG PO TABS: ORAL_TABLET | ORAL | 0 refills | Status: DC

## 2019-07-18 NOTE — Patient Instructions (Addendum)
S  T  O  P  G  A  B  A  P  E  N  T  I  N  +++++++++++++++++++++ S T O P   N  I  A  C  I  N +++++++++++++++++++++  Confusion Confusion is the inability to think with the usual speed or clarity. People who are confused often describe their thinking as cloudy or unclear. Confusion can also include feeling disoriented. This means you are unaware of where you are or who you are. You may also not know the date or time. When confused, you may have difficulty remembering, paying attention, or making decisions. Some people also act aggressively when they are confused. In some cases, confusion may come on quickly. In other cases, it may develop slowly over time. How quickly confusion comes on depends on the cause. Confusion may be caused by:  Head injury (concussion).  Seizures.  Stroke.  Fever.  Brain tumor.  Decrease in brain function due to a vascular or neurologic condition (dementia).  Emotions, like rage or terror.  Inability to know what is real and what is not (hallucinations).  Infections, such as a urinary tract infection (UTI).  Using too much alcohol, drugs, or medicines.  Loss of fluid (dehydration) or an imbalance of salts in the body (electrolytes).  Lack of sleep.  Low blood sugar (diabetes).  Low levels of oxygen. This comes from conditions such as chronic lung disorders.  Side effects of medicines, or taking medicines that affect other medicines (drug interactions).  Lack of certain nutrients, especially niacin, thiamine, vitamin C, or vitamin B.  Sudden drop in body temperature (hypothermia).  Change in routine, such as traveling or being hospitalized. Follow these instructions at home: Pay attention to your symptoms. Tell your health care provider about any changes or if you develop new symptoms. Follow these instructions to control or treat symptoms. Ask a family member or friend for help if needed. Medicines  Take over-the-counter and prescription  medicines only as told by your health care provider.    Avoid pain medicines or sleep medicines until you have fully recovered.   Lifestyle   Eat a balanced diet that includes fruits and vegetables.  Get enough sleep. For most adults, this is 7-9 hours each night.  Do not drink alcohol.  Do not become isolated. Spend time with other people and make plans for your days.  Do not drive until your health care provider says that it is safe to do so.  Do not use any products that contain nicotine or tobacco, such as cigarettes and e-cigarettes. If you need help quitting, ask your health care provider.  Stop other activities that may increase your chances of getting hurt. These may include some work duties, sports activities, swimming, or bike riding. Ask your health care provider what activities are safe for you. What caregivers can do  Find out if the person is confused. Ask the person to state his or her name, age, and the date. If the person is unsure or answers incorrectly, he or she may be confused.  Always introduce yourself, no matter how well the person knows you.  Remind the person of his or her location. Do this often.  Place a calendar and clock near the person who is confused.  Talk about current events and plans for the day.  Keep the environment calm, quiet, and peaceful.  Help the person do the things that he or she is unable to  do. These include: ? Taking medicines. ? Keeping follow-up visits with his or her health care provider. ? Helping with household duties, including meal preparation. ? Running errands.  Get help if you need it. There are several support groups for caregivers.  If the person you are helping needs more support, consider day care, extended care programs, or a skilled nursing facility. The person's health care provider may be able to help evaluate these options. General instructions  Monitor yourself for any conditions you may have. These  may include: ? Checking your blood glucose levels, if you have diabetes. ? Watching your weight, if you are overweight. ? Monitoring your blood pressure, if you have hypertension. ? Monitoring your body temperature, if you have a fever.  Keep all follow-up visits as told by your health care provider. This is important. Contact a health care provider if:  Your symptoms get worse. Get help right away if you:  Feel that you are not able to care for yourself.  Develop severe headaches, repeated vomiting, seizures, blackouts, or slurred speech.  Have increasing confusion, weakness, numbness, restlessness, or personality changes.  Develop a loss of balance, have marked dizziness, feel uncoordinated, or fall.  Develop severe anxiety, or you have delusions or hallucinations. These symptoms may represent a serious problem that is an emergency. Do not wait to see if the symptoms will go away. Get medical help right away. Call your local emergency services (911 in the [place] Do not drive yourself to the hospital. Summary  Confusion is the inability to think with the usual speed or clarity. People who are confused often describe their thinking as cloudy or unclear.  Confusion can also include having difficulty remembering, paying attention, or making decisions.  Confusion may come on quickly or develop slowly over time, depending on the cause. There are many different causes of confusion.  Ask for help from family members or friends if you are unable to take care of yourself. This information is not intended to replace advice given to you by your health care provider. Make sure you discuss any questions you have with your health care provider. Document Revised: 04/20/2017 Document Reviewed: 04/20/2017 Elsevier Patient Education  2020 [employer].

## 2019-07-19 LAB — CBC WITH DIFFERENTIAL/PLATELET
Absolute Monocytes: 468 cells/uL (ref 200–950)
Basophils Absolute: 50 cells/uL (ref 0–200)
Basophils Relative: 0.7 %
Eosinophils Absolute: 396 cells/uL (ref 15–500)
Eosinophils Relative: 5.5 %
HCT: 37.1 % — ABNORMAL LOW (ref 38.5–50.0)
Hemoglobin: 12.3 g/dL — ABNORMAL LOW (ref 13.2–17.1)
Lymphs Abs: 2448 cells/uL (ref 850–3900)
MCH: 32.5 pg (ref 27.0–33.0)
MCHC: 33.2 g/dL (ref 32.0–36.0)
MCV: 97.9 fL (ref 80.0–100.0)
MPV: 9.7 fL (ref 7.5–12.5)
Monocytes Relative: 6.5 %
Neutro Abs: 3838 cells/uL (ref 1500–7800)
Neutrophils Relative %: 53.3 %
Platelets: 185 10*3/uL (ref 140–400)
RBC: 3.79 10*6/uL — ABNORMAL LOW (ref 4.20–5.80)
RDW: 12 % (ref 11.0–15.0)
Total Lymphocyte: 34 %
WBC: 7.2 10*3/uL (ref 3.8–10.8)

## 2019-07-19 LAB — COMPLETE METABOLIC PANEL WITH GFR
AG Ratio: 1.7 (calc) (ref 1.0–2.5)
ALT: 8 U/L — ABNORMAL LOW (ref 9–46)
AST: 20 U/L (ref 10–35)
Albumin: 3.9 g/dL (ref 3.6–5.1)
Alkaline phosphatase (APISO): 69 U/L (ref 35–144)
BUN/Creatinine Ratio: 17 (calc) (ref 6–22)
BUN: 24 mg/dL (ref 7–25)
CO2: 34 mmol/L — ABNORMAL HIGH (ref 20–32)
Calcium: 9.5 mg/dL (ref 8.6–10.3)
Chloride: 106 mmol/L (ref 98–110)
Creat: 1.4 mg/dL — ABNORMAL HIGH (ref 0.70–1.11)
GFR, Est African American: 53 mL/min/{1.73_m2} — ABNORMAL LOW (ref >=60)
GFR, Est Non African American: 46 mL/min/{1.73_m2} — ABNORMAL LOW (ref >=60)
Globulin: 2.3 g/dL (calc) (ref 1.9–3.7)
Glucose, Bld: 84 mg/dL (ref 65–99)
Potassium: 4.7 mmol/L (ref 3.5–5.3)
Sodium: 145 mmol/L (ref 135–146)
Total Bilirubin: 0.4 mg/dL (ref 0.2–1.2)
Total Protein: 6.2 g/dL (ref 6.1–8.1)

## 2019-07-20 ENCOUNTER — Other Ambulatory Visit: Payer: Self-pay | Admitting: Internal Medicine

## 2019-07-20 DIAGNOSIS — R0989 Other specified symptoms and signs involving the circulatory and respiratory systems: Secondary | ICD-10-CM

## 2019-07-20 MED ORDER — AMLODIPINE BESYLATE 10 MG PO TABS: ORAL_TABLET | ORAL | 1 refills | Status: DC

## 2019-07-21 ENCOUNTER — Emergency Department (HOSPITAL_COMMUNITY): Payer: Medicare Other

## 2019-07-21 ENCOUNTER — Other Ambulatory Visit: Payer: Self-pay

## 2019-07-21 ENCOUNTER — Emergency Department (HOSPITAL_COMMUNITY)
Admission: EM | Admit: 2019-07-21 | Discharge: 2019-07-21 | Disposition: A | Discharge status: Home / Self Care | Payer: Medicare Other | Attending: Emergency Medicine | Admitting: Emergency Medicine

## 2019-07-21 ENCOUNTER — Encounter: Payer: Self-pay | Admitting: Internal Medicine

## 2019-07-21 DIAGNOSIS — E1122 Type 2 diabetes mellitus with diabetic chronic kidney disease: Secondary | ICD-10-CM | POA: Insufficient documentation

## 2019-07-21 DIAGNOSIS — R079 Chest pain, unspecified: Secondary | ICD-10-CM | POA: Diagnosis not present

## 2019-07-21 DIAGNOSIS — E1140 Type 2 diabetes mellitus with diabetic neuropathy, unspecified: Secondary | ICD-10-CM | POA: Insufficient documentation

## 2019-07-21 DIAGNOSIS — N183 Chronic kidney disease, stage 3 unspecified: Secondary | ICD-10-CM | POA: Diagnosis not present

## 2019-07-21 DIAGNOSIS — I129 Hypertensive chronic kidney disease with stage 1 through stage 4 chronic kidney disease, or unspecified chronic kidney disease: Secondary | ICD-10-CM | POA: Diagnosis not present

## 2019-07-21 DIAGNOSIS — R05 Cough: Secondary | ICD-10-CM | POA: Diagnosis not present

## 2019-07-21 DIAGNOSIS — R410 Disorientation, unspecified: Secondary | ICD-10-CM | POA: Insufficient documentation

## 2019-07-21 DIAGNOSIS — Z951 Presence of aortocoronary bypass graft: Secondary | ICD-10-CM | POA: Diagnosis not present

## 2019-07-21 DIAGNOSIS — Z7982 Long term (current) use of aspirin: Secondary | ICD-10-CM | POA: Diagnosis not present

## 2019-07-21 DIAGNOSIS — Z8673 Personal history of transient ischemic attack (TIA), and cerebral infarction without residual deficits: Secondary | ICD-10-CM | POA: Insufficient documentation

## 2019-07-21 DIAGNOSIS — R531 Weakness: Secondary | ICD-10-CM | POA: Diagnosis not present

## 2019-07-21 DIAGNOSIS — E1165 Type 2 diabetes mellitus with hyperglycemia: Secondary | ICD-10-CM | POA: Insufficient documentation

## 2019-07-21 DIAGNOSIS — R739 Hyperglycemia, unspecified: Secondary | ICD-10-CM

## 2019-07-21 DIAGNOSIS — Z79899 Other long term (current) drug therapy: Secondary | ICD-10-CM | POA: Insufficient documentation

## 2019-07-21 DIAGNOSIS — R4182 Altered mental status, unspecified: Secondary | ICD-10-CM | POA: Diagnosis present

## 2019-07-21 DIAGNOSIS — Z87891 Personal history of nicotine dependence: Secondary | ICD-10-CM | POA: Insufficient documentation

## 2019-07-21 LAB — CBC WITH DIFFERENTIAL/PLATELET
Abs Immature Granulocytes: 0.05 10*3/uL (ref 0.00–0.07)
Basophils Absolute: 0 10*3/uL (ref 0.0–0.1)
Basophils Relative: 0 %
Eosinophils Absolute: 0 10*3/uL (ref 0.0–0.5)
Eosinophils Relative: 0 %
HCT: 43.9 % (ref 39.0–52.0)
Hemoglobin: 14.3 g/dL (ref 13.0–17.0)
Immature Granulocytes: 1 %
Lymphocytes Relative: 14 %
Lymphs Abs: 1.1 10*3/uL (ref 0.7–4.0)
MCH: 32.6 pg (ref 26.0–34.0)
MCHC: 32.6 g/dL (ref 30.0–36.0)
MCV: 100 fL (ref 80.0–100.0)
Monocytes Absolute: 0.2 10*3/uL (ref 0.1–1.0)
Monocytes Relative: 2 %
Neutro Abs: 6.9 10*3/uL (ref 1.7–7.7)
Neutrophils Relative %: 83 %
Platelets: 235 10*3/uL (ref 150–400)
RBC: 4.39 MIL/uL (ref 4.22–5.81)
RDW: 12.5 % (ref 11.5–15.5)
WBC: 8.2 10*3/uL (ref 4.0–10.5)
nRBC: 0 % (ref 0.0–0.2)

## 2019-07-21 LAB — COMPREHENSIVE METABOLIC PANEL
ALT: 17 U/L (ref 0–44)
AST: 25 U/L (ref 15–41)
Albumin: 3.9 g/dL (ref 3.5–5.0)
Alkaline Phosphatase: 76 U/L (ref 38–126)
Anion gap: 13 (ref 5–15)
BUN: 34 mg/dL — ABNORMAL HIGH (ref 8–23)
CO2: 23 mmol/L (ref 22–32)
Calcium: 9 mg/dL (ref 8.9–10.3)
Chloride: 102 mmol/L (ref 98–111)
Creatinine, Ser: 1.34 mg/dL — ABNORMAL HIGH (ref 0.61–1.24)
GFR calc Af Amer: 56 mL/min — ABNORMAL LOW (ref >60)
GFR calc non Af Amer: 48 mL/min — ABNORMAL LOW (ref >60)
Glucose, Bld: 213 mg/dL — ABNORMAL HIGH (ref 70–99)
Potassium: 4.1 mmol/L (ref 3.5–5.1)
Sodium: 138 mmol/L (ref 135–145)
Total Bilirubin: 0.7 mg/dL (ref 0.3–1.2)
Total Protein: 6.9 g/dL (ref 6.5–8.1)

## 2019-07-21 LAB — URINALYSIS, ROUTINE W REFLEX MICROSCOPIC
Bilirubin Urine: NEGATIVE
Glucose, UA: 50 mg/dL — AB
Hgb urine dipstick: NEGATIVE
Ketones, ur: NEGATIVE mg/dL
Leukocytes,Ua: NEGATIVE
Nitrite: NEGATIVE
Protein, ur: NEGATIVE mg/dL
Specific Gravity, Urine: 1.019 (ref 1.005–1.030)
pH: 7 (ref 5.0–8.0)

## 2019-07-21 LAB — TROPONIN I (HIGH SENSITIVITY): Troponin I (High Sensitivity): 12 ng/L (ref <18)

## 2019-07-21 NOTE — ED Triage Notes (Signed)
Pt arrived via EMS from home with a complaint of weakness since Tuesday. Family also states the pt has been complaining of upper abdominal pain. Family reported pt has had some medication changes this week, pt was taken off his Gabapentin Tuesday and was started on amlodipine yesterday.

## 2019-07-21 NOTE — ED Notes (Signed)
Pt transported to CT ?

## 2019-07-21 NOTE — Discharge Instructions (Signed)
Continue taking home medications as prescribed. Make sure he is staying well-hydrated with water. Continue to monitor his blood sugars closely, socially while he is on the steroid. Follow-up with his primary care doctor next week for recheck of his symptoms. Return to the emergency room with any new, worsening, concerning symptoms.

## 2019-07-21 NOTE — ED Provider Notes (Signed)
[facility] EMERGENCY DEPARTMENT Provider Note   CSN: [id number] Arrival date & time: 07/21/19  2107     History Chief Complaint  Patient presents with  . Weakness    [name] is a 86 y.o. male presenting for evaluation of confusion.  Level 5 caveat due to altered mental status.  Patient states he is here because his wife wants him to be evaluated.  He thinks this is because of weakness, but cannot describe how he has been weak.  Patient states he has been having chest pain and a mild cough.  Unable to describe any further symptoms.  Additional history obtained from patient's wife.  She states for the past 4 days, patient has had new confusion.  He was unable to speak in full sentences several days ago, although this is slowly improving.  She states he often has a blank look on his face, and this is not typical for him.  He used to be on medicine to keep his blood pressure up, but yesterday had to be placed on high blood pressure medication, since then his BP has improved.  Today with EMS, his blood sugar was noted to be elevated in the 280s.  Wife denies recent fevers, chills, shortness of breath, vomiting, complaints of abdominal pain, change in urination, or bowel habits.  Patient was taken off his gabapentin due to confusion 4 days ago.  She states he has not been eating drinking very well today.  Additional history obtained from chart review.  Patient with a history of CAD, diabetes, GERD, hypertension/orthostatic hypotension, upper GI bleed.  HPI     Past Medical History:  Diagnosis Date  . 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 Colon polyps   . Coronary atherosclerosis of unspecified type of vessel, native or graft   . Diabetes ([place])   . Difficult intubation   . 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 Esophageal reflux   . High cholesterol   . History of IBS 02/27/2009  . Hypertension    pt denies, he says he has a h/o hypotension. If BP up he adjusts  the [place]  . Jejunitis with partial SBO 05/20/2017  . Orthostatic hypotension    "BP has been dropping alot when I stand up for the last month or so" (02/17/2016)  . Prostatitis   . Upper GI bleed 06/24/2018  . Vitamin B 12 deficiency   . Vitamin D deficiency     Patient Active Problem List   Diagnosis Date Noted  . At high risk for falls 05/29/2019  . Chronic bilateral low back pain without sciatica 02/07/2019  . Statin intolerance 11/16/2018  . Hyperlipidemia, mixed 08/15/2018  . Low back pain 07/18/2018  . History of lacunar cerebrovascular accident (CVA) 02/12/2018  . Dizziness 02/12/2018  . Myofascial pain 12/18/2017  . Spondylosis without myelopathy or radiculopathy, lumbar region 12/18/2017  . Lumbar radiculopathy, right 12/18/2017  . Cholelithiasis 05/17/2017  . Sinus Bradycardia 05/17/2017  . Thoracic radiculopathy 04/12/2017  . Primary osteoarthritis of right knee 12/14/2016  . DDD (degenerative disc disease), lumbar 10/10/2016  . Lumbar facet arthropathy 08/29/2016  . Idiopathic scoliosis 03/22/2016  . Diabetic neuropathy ([place]) 03/22/2016  . Dysautonomia orthostatic hypotension syndrome ([place]) 02/25/2016  . CKD stage 3 due to type 2 diabetes mellitus ([place]) 02/19/2016  . Coronary artery disease involving coronary bypass graft of native heart without angina pectoris   . Diabetes mellitus type 2, diet-controlled ([place])   . 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  Medication management 08/11/2015  . 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 PVD (peripheral vascular disease) ([place]) 01/01/2014  . Hyperlipidemia associated with type 2 diabetes mellitus ([place]) 04/11/2013  . Supine hypertension   . Vitamin D deficiency   . Vitamin B 12 deficiency   . Coronary atherosclerosis- s/p PCI to LAD in 2009 and PCI to RCA in 2011 02/27/2009  . Abdominal aortic aneurysm ([place]) 02/27/2009  . GERD 02/27/2009    Past Surgical History:  Procedure Laterality Date  . BIOPSY  06/25/2018   Procedure:  BIOPSY;  Surgeon: Rush Landmark Telford Nab., MD;  Location: [facility];  Service: Gastroenterology;;  . BIOPSY  04/24/2019   Procedure: BIOPSY;  Surgeon: Irving Copas., MD;  Location: [facility];  Service: Gastroenterology;;  . BLADDER SURGERY  1969   traumatic pelvic fractures, urethral and bladder repair  . CARDIAC CATHETERIZATION N/A 07/01/2015   Procedure: Left Heart Cath and Coronary Angiography;  Surgeon: Wellington Hampshire, MD;  Location: [facility];  Service: Cardiovascular;  Laterality: N/A;  . COLON RESECTION N/A 05/17/2017   Procedure: DIAGNOSTIC LAPAROSCOPY,;  Surgeon: Leighton Ruff, MD;  Location: [facility];  Service: General;  Laterality: N/A;  . COLONOSCOPY WITH PROPOFOL N/A 04/24/2019   Procedure: COLONOSCOPY WITH PROPOFOL;  Surgeon: Irving Copas., MD;  Location: [facility];  Service: Gastroenterology;  Laterality: N/A;  . CORONARY ANGIOPLASTY WITH STENT PLACEMENT    . CORONARY ARTERY BYPASS GRAFT N/A 07/06/2015   Procedure: CORONARY ARTERY BYPASS GRAFTING (CABG)x 4   utilizing the left internal mammary artery and endoscopically harvested bilateral  sapheneous vein.;  Surgeon: Ivin Poot, MD;  Location: [facility];  Service: Open Heart Surgery;  Laterality: N/A;  . ESOPHAGEAL DILATION  04/24/2019   Procedure: ESOPHAGEAL DILATION;  Surgeon: Rush Landmark Telford Nab., MD;  Location: [facility];  Service: Gastroenterology;;  . ESOPHAGOGASTRODUODENOSCOPY (EGD) WITH PROPOFOL N/A 06/25/2018   Procedure: ESOPHAGOGASTRODUODENOSCOPY (EGD) WITH PROPOFOL;  Surgeon: Irving Copas., MD;  Location: [facility];  Service: Gastroenterology;  Laterality: N/A;  . ESOPHAGOGASTRODUODENOSCOPY (EGD) WITH PROPOFOL N/A 04/24/2019   Procedure: ESOPHAGOGASTRODUODENOSCOPY (EGD) WITH PROPOFOL;  Surgeon: Rush Landmark Telford Nab., MD;  [facility];  Service: Gastroenterology;  Laterality: N/A;  . HEMOSTASIS CLIP PLACEMENT  04/24/2019   Procedure: HEMOSTASIS CLIP  PLACEMENT;  Surgeon: Irving Copas., MD;  Location: [facility];  Service: Gastroenterology;;  . KNEE SURGERY    . LOOP RECORDER INSERTION N/A 02/19/2018   Procedure: LOOP RECORDER INSERTION;  Surgeon: Deboraha Sprang, MD;  Location: [facility];  Service: Cardiovascular;  Laterality: N/A;  . POLYPECTOMY  04/24/2019   Procedure: POLYPECTOMY;  Surgeon: Rush Landmark Telford Nab., MD;  Location: [facility];  Service: Gastroenterology;;  . Lia Foyer LIFTING INJECTION  04/24/2019   Procedure: SUBMUCOSAL LIFTING INJECTION;  Surgeon: Irving Copas., MD;  [facility];  Service: Gastroenterology;;  . TEE WITHOUT CARDIOVERSION N/A 07/06/2015   Procedure: TRANSESOPHAGEAL ECHOCARDIOGRAM (TEE);  Surgeon: Ivin Poot, MD;  Location: [facility];  Service: Open Heart Surgery;  Laterality: N/A;       Family History  Problem Relation Age of Onset  . Heart attack Father        died age 71  . Heart attack Brother        died age 22  . Anuerysm Brother        aortic  . Heart attack Sister        died age 56  . Colon cancer Sister   .  Liver cancer Sister   . Diabetes Maternal Grandmother   . Arthritis Mother   . Dementia Mother   . Heart Problems Brother   . Heart Problems Brother   . Heart Problems Brother   . Heart Problems Brother   . Healthy Daughter   . Healthy Son   . Esophageal cancer Neg Hx   . Inflammatory bowel disease Neg Hx   . Pancreatic cancer Neg Hx   . Stomach cancer Neg Hx     Social History   Tobacco Use  . Smoking status: Former Smoker    Quit date: 07/16/1963    Years since quitting: 56.0  . Smokeless tobacco: Former Systems developer    Types: Chew    Quit date: 1989  Substance Use Topics  . Alcohol use: No  . Drug use: No    Home Medications Prior to Admission medications   Medication Sig Start Date End Date Taking? Authorizing Provider  acetaminophen (TYLENOL) 500 MG tablet Take 1,000 mg by mouth 2 (two) times daily as needed for  headache (pain).    Yes [provider]  amLODipine (NORVASC) 10 MG tablet Take 1/2 to 1 tablet daily for BP Patient taking differently: Take 10 mg by mouth daily.  07/20/19  Yes [name], MD  Ascorbic Acid (VITAMIN C) 500 MG CAPS Take 500 mg by mouth daily.    Yes [provider]  aspirin EC 81 MG tablet Take 1 tablet (81 mg total) by mouth daily. 07/19/18  Yes Croitoru, Mihai, MD  Calcium Carb-Cholecalciferol (CALCIUM+D3 PO) Take 1 tablet by mouth daily.   Yes [provider]  Cholecalciferol (VITAMIN D-3) 5000 units TABS Take 5,000 Units by mouth daily.   Yes [provider]  dexamethasone (DECADRON) 4 MG tablet Take 1 tab 3 x day - 3 days, then 2 x day - 3 days, then 1 tab daily Patient taking differently: Take 4 mg by mouth See admin instructions. Take 1 tab 3 x day - 3 days, then 2 x day - 3 days, then 1 tab daily 07/18/19  Yes [name], MD  Ferrous Sulfate (IRON SLOW RELEASE) 140 (45 Fe) MG TBCR Take 1 tablet by mouth daily.   Yes [provider]  Magnesium 500 MG TABS Take 500 mg by mouth daily.    Yes [provider]  Multiple Vitamin (MULTIVITAMIN WITH MINERALS) TABS tablet Take 1 tablet by mouth daily.   Yes [provider]  Omega-3 Fatty Acids (FISH OIL OMEGA-3 PO) Take 1 capsule by mouth daily.   Yes [provider]  pantoprazole (PROTONIX) 40 MG tablet Take 1 tablet Daily for Indigestion & Heartburn Patient taking differently: Take 40 mg by mouth daily.  03/11/19  Yes [name], MD  polyethylene glycol [facility] / Floria Raveling) packet Take 17 g by mouth daily. 05/22/17  Yes Barton Dubois, MD  Zinc 50 MG TABS Take 1 tablet by mouth daily.   Yes [provider]  butalbital-acetaminophen-caffeine (FIORICET) 50-325-40 MG tablet TAKE 1 TABLET EVERY 4 HOURS ONLY IF SEVERE HEADACHE Patient not taking: Take 1 tablet every 4 hours ONLY if  Severe Headache 12/27/18   [name], MD    fludrocortisone (FLORINEF) 0.1 MG tablet TAKE 2 TABLETS BY MOUTH EVERY DAY Patient not taking: Reported on 07/21/2019 02/25/19   Vicie Mutters, PA-C  gabapentin (NEURONTIN) 600 MG tablet Take 1/2    to   1   tablet 2 x /day for Chronic Pain Patient not taking: Reported on  07/21/2019 02/20/19   [name], MD  glucose blood test strip Check blood sugar 1 time daily-[id number] 07/07/17   [name], MD    Allergies    Cymbalta [duloxetine hcl], Keflex [cephalexin], Simvastatin, and Sudafed [pseudoephedrine]  Review of Systems   Review of Systems  Psychiatric/Behavioral: Positive for confusion (imprvoing).  All other systems reviewed and are negative.   Physical Exam Updated Vital Signs BP (!) 169/88   Pulse 79   Temp 98.4 F (36.9 C) (Rectal)   Resp 12   Ht 6' (1.829 m)   SpO2 96%   BMI 26.15 kg/m   Physical Exam Vitals and nursing note reviewed.  Constitutional:      General: He is not in acute distress.    Appearance: He is well-developed.     Comments: Appears nontoxic  HENT:     Head: Normocephalic and atraumatic.  Eyes:     Extraocular Movements: Extraocular movements intact.     Conjunctiva/sclera: Conjunctivae normal.     Pupils: Pupils are equal, round, and reactive to light.  Cardiovascular:     Rate and Rhythm: Normal rate and regular rhythm.     Pulses: Normal pulses.  Pulmonary:     Effort: Pulmonary effort is normal. No respiratory distress.     Breath sounds: Normal breath sounds. No wheezing.     Comments: Clear lung sounds in all fields.  Speaking full sentences.  Tenderness palpation of the left anterior chest wall.  No erythema, warmth, rashes. Chest:     Chest wall: Tenderness present.  Abdominal:     General: There is no distension.     Palpations: Abdomen is soft. There is no mass.     Tenderness: There is no abdominal tenderness. There is no guarding or rebound.  Musculoskeletal:        General: Normal range of motion.     Cervical  back: Normal range of motion and neck supple.     Right lower leg: No edema.     Left lower leg: No edema.  Skin:    General: Skin is warm and dry.     Capillary Refill: Capillary refill takes less than 2 seconds.  Neurological:     Mental Status: He is alert and oriented to person, place, and time.     Comments: Alert to person, place, time.     ED Results / Procedures / Treatments   Labs (all labs ordered are listed, but only abnormal results are displayed) Labs Reviewed  COMPREHENSIVE METABOLIC PANEL - Abnormal; Notable for the following components:      Result Value   Glucose, Bld 213 (*)    BUN 34 (*)    Creatinine, Ser 1.34 (*)    GFR calc non Af Amer 48 (*)    GFR calc Af Amer 56 (*)    All other components within normal limits  URINALYSIS, ROUTINE W REFLEX MICROSCOPIC - Abnormal; Notable for the following components:   Glucose, UA 50 (*)    All other components within normal limits  URINE CULTURE  CBC WITH DIFFERENTIAL/PLATELET  TROPONIN I (HIGH SENSITIVITY)  TROPONIN I (HIGH SENSITIVITY)    EKG None  Radiology DG Chest 2 View  Result Date: 07/21/2019 CLINICAL DATA:  Weakness and chest pain. EXAM: CHEST - 2 VIEW COMPARISON:  January 19, 2017 FINDINGS: Multiple sternal wires are seen. There is no evidence of acute infiltrate, pleural effusion or pneumothorax. The heart size and mediastinal contours are within normal limits. There is  tortuosity of the descending thoracic aorta. The visualized skeletal structures are unremarkable. IMPRESSION: No active cardiopulmonary disease. Electronically Signed   By: Virgina Norfolk M.D.   On: 07/21/2019 23:12   CT Head Wo Contrast  Result Date: 07/21/2019 CLINICAL DATA:  Generalized weakness EXAM: CT HEAD WITHOUT CONTRAST TECHNIQUE: Contiguous axial images were obtained from the base of the skull through the vertex without intravenous contrast. COMPARISON:  02/17/2016 FINDINGS: Brain: There is atrophy and chronic small vessel  disease changes. Bilateral chronic appearing basal ganglia lacunar infarcts. No acute intracranial abnormality. Specifically, no hemorrhage, hydrocephalus, mass lesion, acute infarction, or significant intracranial injury. Vascular: No hyperdense vessel or unexpected calcification. Skull: No acute calvarial abnormality. Sinuses/Orbits: Visualized paranasal sinuses and mastoids clear. Orbital soft tissues unremarkable. Other: None IMPRESSION: Chronic appearing bilateral basal ganglia lacunar infarcts. Atrophy, chronic microvascular disease. No acute intracranial abnormality. Electronically Signed   By: Rolm Baptise M.D.   On: 07/21/2019 22:37    Procedures Procedures (including critical care time)  Medications Ordered in ED Medications - No data to display  ED Course  I have reviewed the triage vital signs and the nursing notes.  Pertinent labs & imaging results that were available during my care of the patient were reviewed by me and considered in my medical decision making (see chart for details).    MDM Rules/Calculators/A&P                      Patient presenting for evaluation of decreased p.o. intake, confusion, abnormal vital signs.  On exam, patient appears nontoxic.  He is alert to person, place, time.  He is slow to respond, may be mildly confused.  Tenderness palpation of anterior chest wall, has recently get diagnosed with costochondritis.  This may be the cause of his symptoms.  As his pain is reproducible, low suspicion for ACS, however due to age and history will obtain labs including troponin and EKG.  Will obtain chest x-ray due to cough and with reports of aspiration.  Obtain CT head due to confusion.  Urine obtained to assess for infection.  CT head negative for acute findings.  Chest x-ray per interpreted by me, no pneumonia pneumothorax or effusion.  Labs interpreted by me, overall reassuring.  Mild hyperglycemia at 213.  Electrolytes stable, creatinine at baseline.  Urine  without infection.  Case discussed with attending, Dr. Gilford Raid evaluated the patient.  Discussed findings with patient and wife.  Discussed at this time, there is no clear acute intracranial pole emergency requiring hospitalization.  Discussed close monitoring and follow-up with primary care.  Discussed that elevated blood sugar may be due to Decadron the patient was started on for costochondritis, can follow-up with primary doctor regarding this medication.  At this time, patient appears safe for discharge.  Return precautions given.  Patient and wife state they understand and agree to plan.  Final Clinical Impression(s) / ED Diagnoses Final diagnoses:  Confusion  Hyperglycemia    Rx / DC Orders ED Discharge Orders    None       Franchot Heidelberg, PA-C 07/21/19 2333    Isla Pence, MD 07/21/19 [phone]

## 2019-07-21 NOTE — ED Notes (Signed)
Discharge instructions reviewed with pt and pt's wife. Pt  And wife verbalized understanding.

## 2019-07-21 NOTE — Progress Notes (Signed)
History of Present Illness:     Patient is a nice 86 yo MWM with multiple co-morbidities including Dysautonomia  with  labile Supine HTN, & Postural Hypotension, HLD, ASCAD /CABG,ASCVD/Hx CVA's,T2_DM,Vitamin D Deficiency  is brought in today by his wife for concern of abrupt onset of worsening mental status reporting worsening Short Term memory recall, occasional mild anomia/dusnomiaand worsening balance walking.   Medications  Current Outpatient Medications (Endocrine & Metabolic):  .  fludrocortisone (FLORINEF) 0.1 MG tablet, TAKE 2 TABLETS BY MOUTH EVERY DAY .  dexamethasone (DECADRON) 4 MG tablet, Take 1 tab 3 x day - 3 days, then 2 x day - 3 days, then 1 tab daily  Current Outpatient Medications (Cardiovascular):  .  olmesartan (BENICAR) 40 MG tablet, Take 20-40 mg by mouth as needed. Marland Kitchen  amLODipine (NORVASC) 10 MG tablet, Take 1/2 to 1 tablet daily for BP   Current Outpatient Medications (Analgesics):  .  acetaminophen (TYLENOL) 500 MG tablet, Take 1,000 mg by mouth 2 (two) times daily as needed for headache (pain).  Marland Kitchen  aspirin EC 81 MG tablet, Take 1 tablet (81 mg total) by mouth daily. .  butalbital-acetaminophen-caffeine (FIORICET) 50-325-40 MG tablet, TAKE 1 TABLET EVERY 4 HOURS ONLY IF SEVERE HEADACHE (Patient taking differently: Take 1 tablet by mouth every 4 (four) hours as needed for headache. Take 1 tablet every 4 hours ONLY if  Severe Headache)  Current Outpatient Medications (Hematological):  Marland Kitchen  Ferrous Sulfate (IRON SLOW RELEASE) 140 (45 Fe) MG TBCR, Take 1 tablet by mouth daily.   Marland Kitchen  VITAMIN C 500 MG CAPS, Take 1 capsule  daily. Marland Kitchen  CALCIUM+D3 , Take 1 tablet daily. Marland Kitchen  VITAMIN D 5000 units TABS, Take 5,000 Units bdaily. .  Gabapentin  600 MG tablet, Take 1/2    to   1   tablet 2 x /day for Chronic Pain .  Magnesium 500 MG TABS, Take 1 tablet  daily.  .  Multiple Vitamin, Take 1 tablet daily. Marland Kitchen  NIACIN PO, Take 1 tablet daily. .  Omega-3 OMEGA-3 , Take 1 capsule   daily. .  pantoprazole 40 MG, Take 1 tablet Daily for Indigestion & Heartburn .  polyethylene glycol , Take 17 g daily. .  Zinc 50 MG TABS, Take 1 tablet  daily.  Problem list He has Coronary atherosclerosis- s/p PCI to LAD in 2009 and PCI to RCA in 2011; Abdominal aortic aneurysm ([place]); GERD; Supine hypertension; Vitamin D deficiency; Vitamin B 12 deficiency; Hyperlipidemia associated with type 2 diabetes mellitus ([place]); PVD (peripheral vascular disease) ([place]); S/P CABG x 4; Medication management; Intercostal neuralgia; Coronary artery disease involving coronary bypass graft of native heart without angina pectoris; Diabetes mellitus type 2, diet-controlled ([place]); CKD stage 3 due to type 2 diabetes mellitus ([place]); Dysautonomia orthostatic hypotension syndrome ([place]); Idiopathic scoliosis; Diabetic neuropathy ([place]); Lumbar facet arthropathy; DDD (degenerative disc disease), lumbar; Primary osteoarthritis of right knee; Thoracic radiculopathy; Cholelithiasis; Sinus Bradycardia; Myofascial pain; Spondylosis without myelopathy or radiculopathy, lumbar region; Lumbar radiculopathy, right; History of lacunar cerebrovascular accident (CVA); Dizziness; Low back pain; Hyperlipidemia, mixed; Statin intolerance; Chronic bilateral low back pain without sciatica; and At high risk for falls on their problem list.   Observations/Objective:  BP  156/78 -sitting and 114/70-standing  P 68   T 97 F    R 16   Ht 6'   Wt 192 lb 12.8 oz   BMI 26.15  HEENT - WNL. Neck - supple.  Chest - Clear equal BS.  Moderate tenderness of the left lateral chest wall.  Cor - Nl HS. RRR w/o sig MGR. PP 1(+). No edema. MS- FROM w/o deformities.  Gait Nl. Neuro -  Slightly dulled. Nl w/o focal abnormalities.  Assessment and Plan:  1. Cognitive changes  - CBC with Differential/Platelet - COMPLETE METABOLIC PANEL WITH GFR  2. Confusion  - CBC with Differential/Platelet - COMPLETE METABOLIC PANEL WITH GFR  3.  Costochondritis  - dexamethasone (DECADRON) 4 MG tablet; Take 1 tab 3 x day - 3 days, then 2 x day - 3 days, then 1 tab daily  Dispense: 20 tablet; Refill: 0  4. Supine hypertension  - CBC with Differential/Platelet - COMPLETE METABOLIC PANEL WITH GFR  5. Medication management  - CBC with Differential/Platelet - COMPLETE METABOLIC PANEL WITH GFR  Follow Up Instructions:     Patient's caretaker wife, Estill Bamberg is instructed stop patient's Gabapentin (and also his Niacin).      I discussed the assessment and treatment plan with the patient. The patient was provided an opportunity to ask questions and all were answered. The patient agreed with the plan and demonstrated an understanding of the instructions.      The patient was advised to call back or seek an in-person evaluation if the symptoms worsen or if the condition fails to improve as anticipated.  Kirtland Bouchard, MD

## 2019-07-21 NOTE — ED Notes (Signed)
[name] at bedside. PA notified.

## 2019-07-22 ENCOUNTER — Other Ambulatory Visit: Payer: Self-pay | Admitting: Internal Medicine

## 2019-07-22 ENCOUNTER — Telehealth: Payer: Self-pay | Admitting: *Deleted

## 2019-07-22 LAB — URINE CULTURE

## 2019-07-22 MED ORDER — METFORMIN HCL ER 500 MG PO TB24: ORAL_TABLET | ORAL | 3 refills | Status: DC

## 2019-07-22 MED ORDER — MONTELUKAST SODIUM 10 MG PO TABS: ORAL_TABLET | ORAL | 3 refills | Status: DC

## 2019-07-22 NOTE — Telephone Encounter (Signed)
Spouse called and reported the Dexamethasone could be causing his blood sugar to be elevated, per the ED doctor. Patient blood sugar was 134 this morning. Per Dr Melford Aase, the RX for Metformin can be cancelled and the spouse was advised to check a blood sugar in the mornings and call back if elevated.

## 2019-07-23 ENCOUNTER — Other Ambulatory Visit: Payer: Self-pay | Admitting: Internal Medicine

## 2019-07-23 ENCOUNTER — Telehealth: Payer: Self-pay | Admitting: *Deleted

## 2019-07-23 DIAGNOSIS — M5414 Radiculopathy, thoracic region: Secondary | ICD-10-CM

## 2019-07-23 MED ORDER — PREGABALIN 50 MG PO CAPS: ORAL_CAPSULE | ORAL | 0 refills | Status: DC

## 2019-07-23 NOTE — Telephone Encounter (Signed)
Patient called and reported he continues to have right side pain. Dr Melford Aase sent in an RX for generic Lyrica to the patient's pharmacy. Patient is aware.

## 2019-07-31 ENCOUNTER — Telehealth: Payer: Self-pay | Admitting: *Deleted

## 2019-07-31 NOTE — Telephone Encounter (Signed)
Spouse called and reported the patient tried Lyrica and pain is worse in his ribs. Per Dr Melford Aase, the patient was advised he may to see Dr Vonzella Nipple for another spinal injection. Patient has an appointment with Dr Louanne Skye, who referred him to Dr Vonzella Nipple, on 08/07/2019.  Spouse was advised to call Dr Otho Ket office to ask for an appointment sooner, due to being in pain.

## 2019-08-05 ENCOUNTER — Ambulatory Visit (INDEPENDENT_AMBULATORY_CARE_PROVIDER_SITE_OTHER): Payer: Medicare Other | Admitting: *Deleted

## 2019-08-05 DIAGNOSIS — Z8673 Personal history of transient ischemic attack (TIA), and cerebral infarction without residual deficits: Secondary | ICD-10-CM

## 2019-08-05 LAB — CUP PACEART REMOTE DEVICE CHECK
Date Time Interrogation Session: 20210404041227
Implantable Pulse Generator Implant Date: 20191021

## 2019-08-06 ENCOUNTER — Telehealth: Payer: Self-pay | Admitting: Specialist

## 2019-08-06 NOTE — Progress Notes (Signed)
ILR Remote 

## 2019-08-06 NOTE — Telephone Encounter (Signed)
I called and advised that it would be fine for her to come back with her husband at his appt

## 2019-08-06 NOTE — Telephone Encounter (Signed)
Patient spouse called and requesting to come in with patient to appt tomorrow. Please call her to let her know if she can attend the appt.  [phone]

## 2019-08-07 ENCOUNTER — Ambulatory Visit: Payer: Medicare Other | Admitting: Specialist

## 2019-08-07 ENCOUNTER — Encounter: Payer: Self-pay | Admitting: Specialist

## 2019-08-07 ENCOUNTER — Other Ambulatory Visit: Payer: Self-pay

## 2019-08-07 VITALS — BP 146/74 | HR 69 | Ht 72.0 in | Wt 193.0 lb

## 2019-08-07 DIAGNOSIS — M48062 Spinal stenosis, lumbar region with neurogenic claudication: Secondary | ICD-10-CM

## 2019-08-07 DIAGNOSIS — M1711 Unilateral primary osteoarthritis, right knee: Secondary | ICD-10-CM

## 2019-08-07 DIAGNOSIS — M4316 Spondylolisthesis, lumbar region: Secondary | ICD-10-CM | POA: Diagnosis not present

## 2019-08-07 DIAGNOSIS — M5137 Other intervertebral disc degeneration, lumbosacral region: Secondary | ICD-10-CM | POA: Diagnosis not present

## 2019-08-07 DIAGNOSIS — M546 Pain in thoracic spine: Secondary | ICD-10-CM

## 2019-08-07 DIAGNOSIS — M545 Low back pain, unspecified: Secondary | ICD-10-CM

## 2019-08-07 DIAGNOSIS — M48061 Spinal stenosis, lumbar region without neurogenic claudication: Secondary | ICD-10-CM

## 2019-08-07 MED ORDER — AMITRIPTYLINE HCL 10 MG PO TABS: 10.0000 mg | ORAL_TABLET | Freq: Every day | ORAL | 2 refills | Status: DC

## 2019-08-07 MED ORDER — CHLORZOXAZONE 500 MG PO TABS: 500.0000 mg | ORAL_TABLET | Freq: Four times a day (QID) | ORAL | 0 refills | Status: DC | PRN

## 2019-08-07 MED ORDER — HYDROCODONE-ACETAMINOPHEN 5-325 MG PO TABS: 1.0000 | ORAL_TABLET | Freq: Four times a day (QID) | ORAL | 0 refills | Status: DC | PRN

## 2019-08-07 NOTE — Progress Notes (Signed)
Office Visit Note   Patient: [name]           Date of Birth: 12-Jan-1934           MRN: [id number] Visit Date: 08/07/2019              Requested by: [name], [place],  [place] [id number] PCP: [name], MD   Assessment & Plan: Visit Diagnoses: No diagnosis found.  Plan: Avoid bending, stooping and avoid lifting weights greater than 10 lbs. Avoid prolong standing and walking. Avoid frequent bending and stooping  No lifting greater than 10 lbs. May use ice or moist heat for pain. Weight loss is of benefit. Handicap license is approved.  The main ways of treat osteoarthritis, that are found to be success. Weight loss helps to decrease pain. Exercise is important to maintaining cartilage and thickness and strengthening. Avoid NSAIDs like motrin and alleve and other arthritis Ice is okay  In afternoon and evening and hot shower in the am Please call a month ahead of follow up appt so we can order new Synvisc One material.  Follow-Up Instructions: Return in about 6 weeks (around 09/18/2019).   Orders:  No orders of the defined types were placed in this encounter.  No orders of the defined types were placed in this encounter.     Procedures: No procedures performed   Clinical Data: No additional findings.   Subjective: Chief Complaint  Patient presents with  . Lower Back - Follow-up    86 year old male with history of lumbar scoliosis and left foramenal stenosis multiple levels, he has been to PT and was discharged 3/17. He reports PT helped Improve his walking tolerance but now his pain is recurring along the left lower ribs and left flank and left lumbar spine. He is having pain pain at night but is able to Rest and sleep. Notices with rolling over and sitting up. He has no left leg or right leg pain. He has right knee pain with wanting to give away on him, pain with first standing and walking, morning without  stiffness. No bowel or bladder difficulty.    Review of Systems  Constitutional: Positive for activity change and unexpected weight change (had weight loss but now it is better.). Negative for appetite change, chills, diaphoresis, fatigue and fever.  HENT: Negative.  Negative for congestion, dental problem, drooling, ear discharge, ear pain, facial swelling, hearing loss, mouth sores, nosebleeds, postnasal drip, rhinorrhea, sinus pressure, sinus pain, sneezing, sore throat, tinnitus, trouble swallowing and voice change.   Eyes: Negative.  Negative for photophobia, pain, discharge, redness, itching and visual disturbance.  Respiratory: Positive for choking. Negative for apnea, cough, chest tightness, shortness of breath, wheezing and stridor.   Cardiovascular: Negative.  Negative for chest pain, palpitations and leg swelling.  Gastrointestinal: Negative.  Negative for abdominal distention, abdominal pain, anal bleeding, blood in stool, constipation, diarrhea, nausea, rectal pain and vomiting.  Endocrine: Negative for cold intolerance, heat intolerance, polydipsia, polyphagia and polyuria.  Genitourinary: Positive for flank pain. Negative for difficulty urinating, dysuria, enuresis, frequency, genital sores, hematuria and urgency.  Musculoskeletal: Positive for arthralgias, back pain, gait problem and joint swelling. Negative for myalgias, neck pain and neck stiffness.  Skin: Negative for color change, pallor and rash.  Allergic/Immunologic: Negative.  Negative for environmental allergies and food allergies.  Neurological: Positive for dizziness, weakness and numbness. Negative for tremors, seizures, syncope, facial asymmetry, speech difficulty, light-headedness and headaches.  Hematological: Negative.   Psychiatric/Behavioral: Negative for agitation, behavioral problems, confusion, decreased concentration, dysphoric mood, hallucinations, self-injury, sleep disturbance and suicidal ideas. The patient  is not nervous/anxious and is not hyperactive.      Objective: Vital Signs: BP (!) 146/74 (BP Location: Left Arm, Patient Position: Sitting)   Pulse 69   Ht 6' (1.829 m)   Wt 193 lb (87.5 kg)   BMI 26.18 kg/m   Physical Exam Constitutional:      Appearance: He is well-developed.  HENT:     Head: Normocephalic and atraumatic.  Eyes:     Pupils: Pupils are equal, round, and reactive to light.  Pulmonary:     Effort: Pulmonary effort is normal.     Breath sounds: Normal breath sounds.  Abdominal:     General: Bowel sounds are normal.     Palpations: Abdomen is soft.  Musculoskeletal:        General: Normal range of motion.     Cervical back: Normal range of motion and neck supple.  Skin:    General: Skin is warm and dry.  Neurological:     Mental Status: He is alert and oriented to person, place, and time.  Psychiatric:        Behavior: Behavior normal.        Thought Content: Thought content normal.        Judgment: Judgment normal.     Ortho Exam  Specialty Comments:  No specialty comments available.  Imaging: No results found.   PMFS History: Patient Active Problem List   Diagnosis Date Noted  . At high risk for falls 05/29/2019  . Chronic bilateral low back pain without sciatica 02/07/2019  . Statin intolerance 11/16/2018  . Hyperlipidemia, mixed 08/15/2018  . Low back pain 07/18/2018  . History of lacunar cerebrovascular accident (CVA) 02/12/2018  . Dizziness 02/12/2018  . Myofascial pain 12/18/2017  . Spondylosis without myelopathy or radiculopathy, lumbar region 12/18/2017  . Lumbar radiculopathy, right 12/18/2017  . Cholelithiasis 05/17/2017  . Sinus Bradycardia 05/17/2017  . Thoracic radiculopathy 04/12/2017  . Primary osteoarthritis of right knee 12/14/2016  . DDD (degenerative disc disease), lumbar 10/10/2016  . Lumbar facet arthropathy 08/29/2016  . Idiopathic scoliosis 03/22/2016  . Diabetic neuropathy ([place]) 03/22/2016  . Dysautonomia  orthostatic hypotension syndrome ([place]) 02/25/2016  . CKD stage 3 due to type 2 diabetes mellitus ([place]) 02/19/2016  . Coronary artery disease involving coronary bypass graft of native heart without angina pectoris   . Diabetes mellitus type 2, diet-controlled ([place])   . 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 Medication management 08/11/2015  . 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 PVD (peripheral vascular disease) ([place]) 01/01/2014  . Hyperlipidemia associated with type 2 diabetes mellitus ([place]) 04/11/2013  . Supine hypertension   . Vitamin D deficiency   . Vitamin B 12 deficiency   . Coronary atherosclerosis- s/p PCI to LAD in 2009 and PCI to RCA in 2011 02/27/2009  . Abdominal aortic aneurysm ([place]) 02/27/2009  . GERD 02/27/2009   Past Medical History:  Diagnosis Date  . 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 Colon polyps   . Coronary atherosclerosis of unspecified type of vessel, native or graft   . Diabetes ([place])   . Difficult intubation   . 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 Esophageal reflux   . High cholesterol   . History of IBS 02/27/2009  . Hypertension    pt denies, he says he has a h/o hypotension. If BP up  he adjusts the [place]  . Jejunitis with partial SBO 05/20/2017  . Orthostatic hypotension    "BP has been dropping alot when I stand up for the last month or so" (02/17/2016)  . Prostatitis   . Upper GI bleed 06/24/2018  . Vitamin B 12 deficiency   . Vitamin D deficiency     Family History  Problem Relation Age of Onset  . Heart attack Father        died age 30  . Heart attack Brother        died age 20  . Anuerysm Brother        aortic  . Heart attack Sister        died age 9  . Colon cancer Sister   . Liver cancer Sister   . Diabetes Maternal Grandmother   . Arthritis Mother   . Dementia Mother   . Heart Problems Brother   . Heart Problems Brother   . Heart Problems Brother   . Heart Problems Brother   . Healthy Daughter   . Healthy Son   . Esophageal cancer Neg  Hx   . Inflammatory bowel disease Neg Hx   . Pancreatic cancer Neg Hx   . Stomach cancer Neg Hx     Past Surgical History:  Procedure Laterality Date  . BIOPSY  06/25/2018   Procedure: BIOPSY;  Surgeon: Rush Landmark Telford Nab., MD;  Location: [facility];  Service: Gastroenterology;;  . BIOPSY  04/24/2019   Procedure: BIOPSY;  Surgeon: Irving Copas., MD;  Location: [facility];  Service: Gastroenterology;;  . BLADDER SURGERY  1969   traumatic pelvic fractures, urethral and bladder repair  . CARDIAC CATHETERIZATION N/A 07/01/2015   Procedure: Left Heart Cath and Coronary Angiography;  Surgeon: Wellington Hampshire, MD;  Location: [facility];  Service: Cardiovascular;  Laterality: N/A;  . COLON RESECTION N/A 05/17/2017   Procedure: DIAGNOSTIC LAPAROSCOPY,;  Surgeon: Leighton Ruff, MD;  Location: [facility];  Service: General;  Laterality: N/A;  . COLONOSCOPY WITH PROPOFOL N/A 04/24/2019   Procedure: COLONOSCOPY WITH PROPOFOL;  Surgeon: Irving Copas., MD;  Location: [facility];  Service: Gastroenterology;  Laterality: N/A;  . CORONARY ANGIOPLASTY WITH STENT PLACEMENT    . CORONARY ARTERY BYPASS GRAFT N/A 07/06/2015   Procedure: CORONARY ARTERY BYPASS GRAFTING (CABG)x 4   utilizing the left internal mammary artery and endoscopically harvested bilateral  sapheneous vein.;  Surgeon: Ivin Poot, MD;  Location: [facility];  Service: Open Heart Surgery;  Laterality: N/A;  . ESOPHAGEAL DILATION  04/24/2019   Procedure: ESOPHAGEAL DILATION;  Surgeon: Rush Landmark Telford Nab., MD;  Location: [facility];  Service: Gastroenterology;;  . ESOPHAGOGASTRODUODENOSCOPY (EGD) WITH PROPOFOL N/A 06/25/2018   Procedure: ESOPHAGOGASTRODUODENOSCOPY (EGD) WITH PROPOFOL;  Surgeon: Irving Copas., MD;  Location: [facility];  Service: Gastroenterology;  Laterality: N/A;  . ESOPHAGOGASTRODUODENOSCOPY (EGD) WITH PROPOFOL N/A 04/24/2019   Procedure: ESOPHAGOGASTRODUODENOSCOPY (EGD) WITH  PROPOFOL;  Surgeon: Rush Landmark Telford Nab., MD;  Location: [facility];  Service: Gastroenterology;  Laterality: N/A;  . HEMOSTASIS CLIP PLACEMENT  04/24/2019   Procedure: HEMOSTASIS CLIP PLACEMENT;  Surgeon: Irving Copas., MD;  Location: [facility];  Service: Gastroenterology;;  . KNEE SURGERY    . LOOP RECORDER INSERTION N/A 02/19/2018   Procedure: LOOP RECORDER INSERTION;  Surgeon: Deboraha Sprang, MD;  Location: [facility];  Service: Cardiovascular;  Laterality: N/A;  . POLYPECTOMY  04/24/2019   Procedure: POLYPECTOMY;  Surgeon: Irving Copas., MD;  Location:  [facility];  Service: Gastroenterology;;  . Lia Foyer LIFTING INJECTION  04/24/2019   Procedure: SUBMUCOSAL LIFTING INJECTION;  Surgeon: Irving Copas., MD;  Location: [facility];  Service: Gastroenterology;;  . TEE WITHOUT CARDIOVERSION N/A 07/06/2015   Procedure: TRANSESOPHAGEAL ECHOCARDIOGRAM (TEE);  Surgeon: Ivin Poot, MD;  Location: [facility];  Service: Open Heart Surgery;  Laterality: N/A;   Social History   Occupational History  . Occupation: Retired  Tobacco Use  . Smoking status: Former Smoker    Quit date: 07/16/1963    Years since quitting: 56.0  . Smokeless tobacco: Former Systems developer    Types: Chew    Quit date: 1989  Substance and Sexual Activity  . Alcohol use: No  . Drug use: No  . Sexual activity: Not Currently

## 2019-08-13 ENCOUNTER — Other Ambulatory Visit: Payer: Self-pay | Admitting: Radiology

## 2019-08-13 DIAGNOSIS — M48061 Spinal stenosis, lumbar region without neurogenic claudication: Secondary | ICD-10-CM

## 2019-08-13 DIAGNOSIS — R29898 Other symptoms and signs involving the musculoskeletal system: Secondary | ICD-10-CM

## 2019-08-15 ENCOUNTER — Other Ambulatory Visit: Payer: Self-pay | Admitting: Internal Medicine

## 2019-08-15 ENCOUNTER — Ambulatory Visit (INDEPENDENT_AMBULATORY_CARE_PROVIDER_SITE_OTHER): Payer: Medicare Other | Admitting: Physical Therapy

## 2019-08-15 ENCOUNTER — Other Ambulatory Visit: Payer: Self-pay

## 2019-08-15 ENCOUNTER — Encounter: Payer: Self-pay | Admitting: Physical Therapy

## 2019-08-15 VITALS — BP 170/100 | HR 77

## 2019-08-15 DIAGNOSIS — M6281 Muscle weakness (generalized): Secondary | ICD-10-CM

## 2019-08-15 DIAGNOSIS — G8929 Other chronic pain: Secondary | ICD-10-CM

## 2019-08-15 DIAGNOSIS — M545 Low back pain, unspecified: Secondary | ICD-10-CM

## 2019-08-15 DIAGNOSIS — R2689 Other abnormalities of gait and mobility: Secondary | ICD-10-CM | POA: Diagnosis not present

## 2019-08-15 NOTE — Therapy (Signed)
[place], [place], [address] Phone: [phone]   Fax:  [phone]  Physical Therapy Evaluation  Patient Details  Name: [name] MRN: [id number] Date of Birth: [birth year] Referring Provider (PT): [name], MD   Encounter Date: 08/15/2019  PT End of Session - 08/15/19 1139    Visit Number  1    Number of Visits  16    Date for PT Re-Evaluation  10/10/19    Authorization Type  [employer] MCR, will need KX, had 17 visits earlier in 2021    PT Start Time  0930    PT Stop Time  1015    PT Time Calculation (min)  45 min    Activity Tolerance  Patient tolerated treatment well    Behavior During Therapy  [facility] for tasks assessed/performed       Past Medical History:  Diagnosis Date  . 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 Colon polyps   . Coronary atherosclerosis of unspecified type of vessel, native or graft   . Diabetes ([place])   . Difficult intubation   . 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 Esophageal reflux   . High cholesterol   . History of IBS 02/27/2009  . Hypertension    pt denies, he says he has a h/o hypotension. If BP up he adjusts the Florinef  . Jejunitis with partial SBO 05/20/2017  . Orthostatic hypotension    "BP has been dropping alot when I stand up for the last month or so" (02/17/2016)  . Prostatitis   . Upper GI bleed 06/24/2018  . Vitamin B 12 deficiency   . Vitamin D deficiency     Past Surgical History:  Procedure Laterality Date  . BIOPSY  06/25/2018   Procedure: BIOPSY;  Surgeon: Rush Landmark Telford Nab., MD;  Location: [facility];  Service: Gastroenterology;;  . BIOPSY  04/24/2019   Procedure: BIOPSY;  Surgeon: Irving Copas., MD;  Location: [facility];  Service: Gastroenterology;;  . BLADDER SURGERY  1969   traumatic pelvic fractures, urethral and bladder repair  . CARDIAC CATHETERIZATION N/A 07/01/2015   Procedure: Left Heart Cath and Coronary Angiography;  Surgeon:  Wellington Hampshire, MD;  Location: [facility];  Service: Cardiovascular;  Laterality: N/A;  . COLON RESECTION N/A 05/17/2017   Procedure: DIAGNOSTIC LAPAROSCOPY,;  Surgeon: Leighton Ruff, MD;  Location: [facility];  Service: General;  Laterality: N/A;  . COLONOSCOPY WITH PROPOFOL N/A 04/24/2019   Procedure: COLONOSCOPY WITH PROPOFOL;  Surgeon: Irving Copas., MD;  [facility];  Service: Gastroenterology;  Laterality: N/A;  . CORONARY ANGIOPLASTY WITH STENT PLACEMENT    . CORONARY ARTERY BYPASS GRAFT N/A 07/06/2015   Procedure: CORONARY ARTERY BYPASS GRAFTING (CABG)x 4   utilizing the left internal mammary artery and endoscopically harvested bilateral  sapheneous vein.;  Surgeon: Ivin Poot, MD;  Location: [facility];  Service: Open Heart Surgery;  Laterality: N/A;  . ESOPHAGEAL DILATION  04/24/2019   Procedure: ESOPHAGEAL DILATION;  Surgeon: Rush Landmark Telford Nab., MD;  [facility];  Service: Gastroenterology;;  . ESOPHAGOGASTRODUODENOSCOPY (EGD) WITH PROPOFOL N/A 06/25/2018   Procedure: ESOPHAGOGASTRODUODENOSCOPY (EGD) WITH PROPOFOL;  Surgeon: Irving Copas., MD;  Location: [facility];  Service: Gastroenterology;  Laterality: N/A;  . ESOPHAGOGASTRODUODENOSCOPY (EGD) WITH PROPOFOL N/A 04/24/2019   Procedure: ESOPHAGOGASTRODUODENOSCOPY (EGD) WITH PROPOFOL;  Surgeon: Rush Landmark Telford Nab., MD;  [facility];  Service: Gastroenterology;  Laterality: N/A;  . HEMOSTASIS CLIP PLACEMENT  04/24/2019  Procedure: HEMOSTASIS CLIP PLACEMENT;  Surgeon: Irving Copas., MD;  [facility];  Service: Gastroenterology;;  . KNEE SURGERY    . LOOP RECORDER INSERTION N/A 02/19/2018   Procedure: LOOP RECORDER INSERTION;  Surgeon: Deboraha Sprang, MD;  Location: [facility];  Service: Cardiovascular;  Laterality: N/A;  . POLYPECTOMY  04/24/2019   Procedure: POLYPECTOMY;  Surgeon: Rush Landmark Telford Nab., MD;  Location: [facility];   Service: Gastroenterology;;  . Lia Foyer LIFTING INJECTION  04/24/2019   Procedure: SUBMUCOSAL LIFTING INJECTION;  Surgeon: Irving Copas., MD;  [facility];  Service: Gastroenterology;;  . TEE WITHOUT CARDIOVERSION N/A 07/06/2015   Procedure: TRANSESOPHAGEAL ECHOCARDIOGRAM (TEE);  Surgeon: Ivin Poot, MD;  Location: [facility];  Service: Open Heart Surgery;  Laterality: N/A;    Vitals:   08/15/19 1127  BP: (!) 170/100  Pulse: 77     Subjective Assessment - 08/15/19 0939    Subjective  He was in PT earlier this year and was discharged due to progress. He then started having BP issues and blood glucose issues and was hospitalized 3 weeks ago. He then stopped exercising and had 2 falls at home last week. MD is recommending more PT for leg strength and spinal stenosis.    Pertinent History  PMH: CVA X5, DM, CKD, CAD, CABGX4,    Limitations  Lifting;Standing;Walking;House hold activities    How long can you stand comfortably?  2 min    How long can you walk comfortably?  household distances    Diagnostic tests  Imaging shows "Spinal stenosis of lumbar region with neurogenic claudication, advanced lumbar DDD, Scoliosis"    Patient Stated Goals  stand and walk better, improve leg strength and pain, decrease falls    Currently in Pain?  Yes    Pain Score  6     Pain Location  Back    Pain Orientation  Lower    Pain Descriptors / Indicators  Aching   can be sharp   Pain Type  Chronic pain    Pain Onset  More than a month ago    Pain Frequency  Intermittent    Aggravating Factors   standing    Pain Relieving Factors  rest         [facility] PT Assessment - 08/15/19 0001      Assessment   Medical Diagnosis  bilat leg weakness, severe spinal stenosis    Referring Provider (PT)  [name], MD    Onset Date/Surgical Date  --   chronic back pain and leg weakness   Next MD Visit  nothing scheduled      Balance Screen   Has the patient fallen in the past 6 months   Yes    How many times?  2    Has the patient had a decrease in activity level because of a fear of falling?   Yes    Is the patient reluctant to leave their home because of a fear of falling?   Yes      [place] residence    Living Arrangements  Spouse/significant other    Additional Comments  has ramp to enter      [place]  Retired    Leisure  Equities trader   Overall Cognitive Status  History of cognitive impairments - at baseline   having more trouble with memory since he was in PT  last     AROM   Lumbar Flexion  WFL    Lumbar Extension  to neutral only and painful    Lumbar - Right Side Bend  50%    Lumbar - Left Side Bend  25% and painful    Lumbar - Right Rotation  25%    Lumbar - Left Rotation  25%      Strength   Right Hip Flexion  3/5    Right Hip ABduction  4/5    Left Hip Flexion  4/5    Left Hip ABduction  4/5    Right Knee Flexion  4+/5    Right Knee Extension  4/5    Left Knee Flexion  5/5    Left Knee Extension  5/5    Right Ankle Dorsiflexion  4/5    Left Ankle Dorsiflexion  4/5      Standardized Balance Assessment   Standardized Balance Assessment  Timed Up and Go Test;Five Times Sit to Stand    Five times sit to stand comments   31      Timed Up and Go Test   TUG Comments  24 seconds using RW and CGA          Objective measurements completed on examination: See above findings.      [place] Adult PT Treatment/Exercise - 08/15/19 0001      Ambulation/Gait   Ambulation/Gait Assistance  4: Min guard    Ambulation Distance (Feet)  100 Feet    Assistive device  Rolling walker      Exercises   Exercises  Other Exercises      Lumbar Exercises: Aerobic   Nustep  6 min L6 UE/LE seat 12      Knee/Hip Exercises: Standing   Other Standing Knee Exercises  mini squats at counter X 10 reps, chair behind him for safety    Other Standing Knee Exercises  Heel/toe raises X 10 reps bilat with  UE support             PT Education - 08/15/19 1137    Education Details  importance of consistent HEP at home for leg strength and back stretching.    Person(s) Educated  Patient;Spouse    Methods  Explanation;Demonstration;Verbal cues;Handout    Comprehension  Verbalized understanding          PT Long Term Goals - 08/15/19 1153      PT LONG TERM GOAL #1   Title  independent with updated HEP (Target all LTG 8 weeks  10/10/2019)    Time  8    Period  Weeks    Status  New      PT LONG TERM GOAL #2   Title  improve 5TSTS test to less than 21 seconds to show improved leg strength/endurance    Baseline  31    Time  8    Period  Weeks    Status  New      PT LONG TERM GOAL #3   Title  Improve bilat leg strength to at least 4+/5 MMT measured in sitting to improve function    Baseline  3-4 for Lt leg    Time  8    Period  Weeks    Status  New      PT LONG TERM GOAL #4   Title  Will improve 5TSTS to <25 sec to show improved leg strength and endurance. Revised on 06/19/19 to <20 seconds    Baseline  31  Time  8    Period  Weeks    Status  Achieved      PT LONG TERM GOAL #5   Title  Ambulate with LRAD mod I  for 625 ft or stand at least 10 min    Time  8    Period  Weeks    Status  New             Plan - 08/15/19 1146    Clinical Impression Statement  He returns to PT after he was discharged earlier in the year. He has regressed since then and is having more leg weakness, decreased balance, and overall more back pain, and is at an increased risk of falling. He will benefit from skilled PT to address his deficits.    Personal Factors and Comorbidities  Comorbidity 3+    Comorbidities  PMH: CVA X5, DM, CKD, CAD, CABGX4,    Examination-Activity Limitations  Bed Mobility;Bend;Carry;Squat;Stairs;Lift;Stand;Transfers;Locomotion Level    Examination-Participation Restrictions  Meal Prep;Cleaning;Community Activity;Shop    Stability/Clinical Decision Making   Evolving/Moderate complexity    Clinical Decision Making  Moderate    Rehab Potential  Fair    PT Frequency  2x / week    PT Duration  8 weeks    PT Treatment/Interventions  ADLs/Self Care Home Management;Cryotherapy;Iontophoresis 4mg /ml Dexamethasone;Moist Heat;Gait training;Stair training;Functional mobility training;Therapeutic activities;Therapeutic exercise;Balance training;Neuromuscular re-education;Manual techniques;Passive range of motion;Taping    PT Next Visit Plan  needs bilat leg strength, gait, balance, endurance    PT Home Exercise Plan  Access Code: [email or web address]    Consulted and Agree with Plan of Care  Patient       Patient will benefit from skilled therapeutic intervention in order to improve the following deficits and impairments:  Abnormal gait, Decreased activity tolerance, Decreased balance, Decreased endurance, Decreased range of motion, Decreased strength, Hypomobility, Difficulty walking, Impaired flexibility, Postural dysfunction, Pain  Visit Diagnosis: Muscle weakness (generalized)  Other abnormalities of gait and mobility  Chronic bilateral low back pain, unspecified whether sciatica present     Problem List Patient Active Problem List   Diagnosis Date Noted  . At high risk for falls 05/29/2019  . Chronic bilateral low back pain without sciatica 02/07/2019  . Statin intolerance 11/16/2018  . Hyperlipidemia, mixed 08/15/2018  . Low back pain 07/18/2018  . History of lacunar cerebrovascular accident (CVA) 02/12/2018  . Dizziness 02/12/2018  . Myofascial pain 12/18/2017  . Spondylosis without myelopathy or radiculopathy, lumbar region 12/18/2017  . Lumbar radiculopathy, right 12/18/2017  . Cholelithiasis 05/17/2017  . Sinus Bradycardia 05/17/2017  . Thoracic radiculopathy 04/12/2017  . Primary osteoarthritis of right knee 12/14/2016  . DDD (degenerative disc disease), lumbar 10/10/2016  . Lumbar facet arthropathy 08/29/2016  . Idiopathic scoliosis  03/22/2016  . Diabetic neuropathy ([place]) 03/22/2016  . Dysautonomia orthostatic hypotension syndrome ([place]) 02/25/2016  . CKD stage 3 due to type 2 diabetes mellitus ([place]) 02/19/2016  . Coronary artery disease involving coronary bypass graft of native heart without angina pectoris   . Diabetes mellitus type 2, diet-controlled ([place])   . 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 Medication management 08/11/2015  . 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 PVD (peripheral vascular disease) ([place]) 01/01/2014  . Hyperlipidemia associated with type 2 diabetes mellitus ([place]) 04/11/2013  . Supine hypertension   . Vitamin D deficiency   . Vitamin B 12 deficiency   . Coronary atherosclerosis- s/p PCI to LAD in 2009 and PCI to RCA in 2011 02/27/2009  . Abdominal aortic aneurysm (  [place]) 02/27/2009  . GERD 02/27/2009    Silvestre Mesi 08/15/2019, 11:59 AM  [facility] [address] [place], [place], [address] Phone: [phone]   Fax:  [phone]  Name: [name] MRN: [id number] Date of Birth: July 30, 1933

## 2019-08-15 NOTE — Addendum Note (Signed)
Addended by: [name] on: 08/15/2019 12:02 PM   Modules accepted: Orders

## 2019-08-19 ENCOUNTER — Ambulatory Visit: Payer: Medicare Other | Admitting: Adult Health

## 2019-08-19 ENCOUNTER — Encounter: Payer: Self-pay | Admitting: Adult Health

## 2019-08-19 ENCOUNTER — Other Ambulatory Visit: Payer: Self-pay

## 2019-08-19 VITALS — BP 126/78 | HR 81 | Temp 97.5°F | Ht 72.0 in | Wt 186.0 lb

## 2019-08-19 DIAGNOSIS — N390 Urinary tract infection, site not specified: Secondary | ICD-10-CM | POA: Diagnosis not present

## 2019-08-19 DIAGNOSIS — R509 Fever, unspecified: Secondary | ICD-10-CM

## 2019-08-19 DIAGNOSIS — R300 Dysuria: Secondary | ICD-10-CM | POA: Diagnosis not present

## 2019-08-19 DIAGNOSIS — R350 Frequency of micturition: Secondary | ICD-10-CM | POA: Diagnosis not present

## 2019-08-19 MED ORDER — SULFAMETHOXAZOLE-TRIMETHOPRIM 800-160 MG PO TABS: 1.0000 | ORAL_TABLET | Freq: Two times a day (BID) | ORAL | 0 refills | Status: DC

## 2019-08-19 NOTE — Progress Notes (Signed)
Assessment and Plan:  Jatavion was seen today for dysuria.  Diagnoses and all orders for this visit:  Urinary tract infection without hematuria, site unspecified Fever and chills Frequency of urination Dysuria UTI vs prostatitis, in frail elder, declining GU exam/DRE Discussed concern with possible protatitits; reviewed with Dr. Melford Aase  Reasonable to check labs and proceed with plan to complete 30 days of bactrim DS pending culture PUSH FLUIDS; follow up in pending lab results The patient and family were advised to call immediately if he has any concerning symptoms in the interval. The patient voices understanding of current treatment options and is in agreement with the current care plan.The patient knows to call the clinic with any problems, questions or concerns or go to the ER if any further progression of symptoms.  -     CBC with Differential/Platelet -     COMPLETE METABOLIC PANEL WITH GFR -     Urine Culture -     Urinalysis, Routine w reflex microscopic -     sulfamethoxazole-trimethoprim (BACTRIM DS) 800-160 MG tablet; Take 1 tablet by mouth 2 (two) times daily.  Further disposition pending results of labs. Discussed med's effects and SE's.   Over 30 minutes of exam, counseling, chart review, and critical decision making was performed.   Future Appointments  Date Time Provider [place]  08/20/2019 11:45 AM Maricela Bo, PT [facility] None  08/28/2019 11:45 AM [name], PT [facility] None  08/29/2019  2:30 PM [name], MD [facility] None  08/30/2019  2:00 PM [name], PT [facility] None  09/03/2019 11:00 AM [name], PT [facility] None  09/05/2019 11:00 AM [name], PT [facility] None  09/09/2019 11:05 AM [facility] DEVICE REMOTES [facility]  09/10/2019 11:00 AM [name], PT [facility] None  09/12/2019 11:00 AM [name], PT [facility] None  09/17/2019 11:00 AM Oretha Caprice, PT [facility] None  09/19/2019 11:00 AM Scot Jun B,  PT [facility] None  09/24/2019 11:00 AM [name], PT [facility] None  09/26/2019 11:00 AM [name], PT [facility] None  10/01/2019 11:00 AM [name], PT [facility] None  10/03/2019 11:00 AM [name], PT [facility] None  10/14/2019 11:05 AM [facility] DEVICE REMOTES [facility]  11/18/2019 11:05 AM [facility] DEVICE REMOTES [facility]  12/23/2019 11:05 AM [facility] DEVICE REMOTES [facility]  01/27/2020 11:05 AM [facility] DEVICE REMOTES [facility]  03/02/2020 11:05 AM [facility] DEVICE REMOTES [facility]  04/06/2020 11:05 AM [facility] DEVICE REMOTES [facility]  05/11/2020 11:05 AM [facility] DEVICE REMOTES [facility]  06/01/2020  3:00 PM Unika Nazareno, Caryl Pina, NP [facility] None    ------------------------------------------------------------------------------------------------------------------   HPI BP 126/78   Pulse 81   Temp (!) 97.5 F (36.4 C)   Ht 6' (1.829 m)   Wt 186 lb (84.4 kg)   SpO2 95%   BMI 25.23 kg/m   86 y.o.male T2DM with CKD, with hx of prostatitis, reports remote history of UTI presents for evaluation of possible UTI accompanied by his wife.   He reports started having episodes of chills, felt weak and reports fell without injury to the ground a few occasions starting 2 weeks ago, started noting frequency/urgency and strong urine odor, episodes of incontinence about 1 week ago, then this past weekend had an episode of chills, fever up 100.8 over the weekend, improved after tylenol and presents for evaluation.   He also endorses dysuria, denies abdominal pain, n/v/d/c, denies testicular pain, groin pain, pain  with BMs, or hematuria. Denies straining with urination, nocturia, weak stream, sense of incomplete bladder emptying.    R sided lower back pain since Saturday "just hurts' - struggles to characterize further - constant, non-radiating, denies atypical numbness  (baseline neuropathy), 5/10, hasn't tried any interventions. Takes 1000 mg tylenol BID at baseline, lyrica for chronic pain. Denies improvement or aggravation by movement. Hasn't tried heat or ice. He has hx of lumbar facet arthropathy, spondylosis, scoliosis, DDD. Apparently frequent falls are not new, has been working with outpatient rehab due to decline, generalized weakness and falls/high fall risk.   Had CT abd in 2019 which showed normal KUB, no stones, mildly enlarged prostate.   Has been pushing water intake.    Lab Results  Component Value Date   GFRNONAA 48 (L) 07/21/2019    Past Medical History:  Diagnosis Date  . 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 Colon polyps   . Coronary atherosclerosis of unspecified type of vessel, native or graft   . Diabetes ([place])   . Difficult intubation   . 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 Esophageal reflux   . High cholesterol   . History of IBS 02/27/2009  . Hypertension    pt denies, he says he has a h/o hypotension. If BP up he adjusts the Florinef  . Jejunitis with partial SBO 05/20/2017  . Orthostatic hypotension    "BP has been dropping alot when I stand up for the last month or so" (02/17/2016)  . Prostatitis   . Upper GI bleed 06/24/2018  . Vitamin B 12 deficiency   . Vitamin D deficiency      Allergies  Allergen Reactions  . Cymbalta [Duloxetine Hcl] Other (See Comments)    Dizziness, hallucinations.   . Keflex [Cephalexin] Other (See Comments)    dizziness  . Simvastatin Other (See Comments)    Joint pain  . Sudafed [Pseudoephedrine] Other (See Comments)    Dizziness    Current Outpatient Medications on File Prior to Visit  Medication Sig  . acetaminophen (TYLENOL) 500 MG tablet Take 1,000 mg by mouth 2 (two) times daily as needed for headache (pain).   Marland Kitchen amitriptyline (ELAVIL) 10 MG tablet Take 1 tablet (10 mg total) by mouth at bedtime.  Marland Kitchen amLODipine (NORVASC) 10 MG tablet Take 1/2 to 1 tablet daily for BP (Patient  taking differently: Take 10 mg by mouth daily. )  . Ascorbic Acid (VITAMIN C) 500 MG CAPS Take 500 mg by mouth daily.   Marland Kitchen aspirin EC 81 MG tablet Take 1 tablet (81 mg total) by mouth daily.  . butalbital-acetaminophen-caffeine (FIORICET) 50-325-40 MG tablet TAKE 1 TABLET EVERY 4 HOURS ONLY IF SEVERE HEADACHE  . Calcium Carb-Cholecalciferol (CALCIUM+D3 PO) Take 1 tablet by mouth daily.  . Cholecalciferol (VITAMIN D-3) 5000 units TABS Take 5,000 Units by mouth daily.  . Ferrous Sulfate (IRON SLOW RELEASE) 140 (45 Fe) MG TBCR Take 1 tablet by mouth daily.  . fludrocortisone (FLORINEF) 0.1 MG tablet TAKE 2 TABLETS BY MOUTH EVERY DAY  . glucose blood test strip Check blood sugar 1 time daily-[id number]  . Magnesium 500 MG TABS Take 500 mg by mouth daily.   . montelukast (SINGULAIR) 10 MG tablet Take 1 tablet  Daily for Allergies & Asthma  . Multiple Vitamin (MULTIVITAMIN WITH MINERALS) TABS tablet Take 1 tablet by mouth daily.  . Omega-3 Fatty Acids (FISH OIL OMEGA-3 PO) Take 1 capsule by mouth daily.  . pantoprazole (PROTONIX) 40 MG tablet Take  1 tablet Daily for Indigestion & Heartburn (Patient taking differently: Take 40 mg by mouth daily. )  . polyethylene glycol (MIRALAX / GLYCOLAX) packet Take 17 g by mouth daily.  . pregabalin (LYRICA) 50 MG capsule Take 1 capsule 3 x  /day for Nerve Pain  . Zinc 50 MG TABS Take 1 tablet by mouth daily.  . chlorzoxazone (PARAFON FORTE DSC) 500 MG tablet Take 1 tablet (500 mg total) by mouth every 6 (six) hours as needed for muscle spasms. (Patient not taking: Reported on 08/19/2019)  . dexamethasone (DECADRON) 4 MG tablet Take 1 tab 3 x day - 3 days, then 2 x day - 3 days, then 1 tab daily (Patient taking differently: Take 4 mg by mouth See admin instructions. Take 1 tab 3 x day - 3 days, then 2 x day - 3 days, then 1 tab daily)  . HYDROcodone-acetaminophen (NORCO/VICODIN) 5-325 MG tablet Take 1 tablet by mouth every 6 (six) hours as needed for moderate pain.    No current facility-administered medications on file prior to visit.    ROS: all negative except above.   Physical Exam:  BP 126/78   Pulse 81   Temp (!) 97.5 F (36.4 C)   Ht 6' (1.829 m)   Wt 186 lb (84.4 kg)   SpO2 95%   BMI 25.23 kg/m   General Appearance: Frail elderly gentleman, well dressed, in no apparent/acute distress. Eyes: PERRLA, conjunctiva no swelling or erythema ENT/Mouth: No erythema, swelling, or exudate on post pharynx.  Tonsils not swollen or erythematous. Hearing normal.  Neck: Supple Respiratory: Respiratory effort normal, BS equal bilaterally without rales, rhonchi, wheezing or stridor.  Cardio: RRR with no MRGs. Brisk peripheral pulses without edema.  Abdomen: Soft, + BS.  Non tender, no guarding, rebound, hernias, masses. Lymphatics: Non tender without lymphadenopathy.  Musculoskeletal: No obvious deformity, ,4/5 strength throughout, very weak sitting to standing, slow ambulation with walker, shuffling gait. He has R sided lumbar muscular tenderness. No midline spinous tenderness, no SI joint tenderness.  Skin: Warm, dry without rashes, lesions, notable ecchymosis.  Neuro: Cranial nerves intact. Diminished peripheral sensation Psych: Awake and oriented X 3, normal affect, Insight and Judgment appropriate.  GU: declines testicular exam, DRE/prostate exam today    Izora Ribas, NP 4:15 PM [facility]

## 2019-08-20 ENCOUNTER — Encounter: Payer: Self-pay | Admitting: Physical Therapy

## 2019-08-20 ENCOUNTER — Other Ambulatory Visit: Payer: Self-pay | Admitting: Physician Assistant

## 2019-08-20 ENCOUNTER — Ambulatory Visit (INDEPENDENT_AMBULATORY_CARE_PROVIDER_SITE_OTHER): Payer: Medicare Other | Admitting: Physical Therapy

## 2019-08-20 DIAGNOSIS — R2689 Other abnormalities of gait and mobility: Secondary | ICD-10-CM

## 2019-08-20 DIAGNOSIS — M545 Low back pain, unspecified: Secondary | ICD-10-CM

## 2019-08-20 DIAGNOSIS — G8929 Other chronic pain: Secondary | ICD-10-CM

## 2019-08-20 DIAGNOSIS — M6281 Muscle weakness (generalized): Secondary | ICD-10-CM | POA: Diagnosis not present

## 2019-08-20 DIAGNOSIS — M5441 Lumbago with sciatica, right side: Secondary | ICD-10-CM

## 2019-08-20 DIAGNOSIS — R29818 Other symptoms and signs involving the nervous system: Secondary | ICD-10-CM

## 2019-08-20 LAB — CBC WITH DIFFERENTIAL/PLATELET
Absolute Monocytes: 637 cells/uL (ref 200–950)
Basophils Absolute: 36 cells/uL (ref 0–200)
Basophils Relative: 0.4 %
Eosinophils Absolute: 264 cells/uL (ref 15–500)
Eosinophils Relative: 2.9 %
HCT: 42.4 % (ref 38.5–50.0)
Hemoglobin: 13.6 g/dL (ref 13.2–17.1)
Lymphs Abs: 2439 cells/uL (ref 850–3900)
MCH: 31.4 pg (ref 27.0–33.0)
MCHC: 32.1 g/dL (ref 32.0–36.0)
MCV: 97.9 fL (ref 80.0–100.0)
MPV: 9.6 fL (ref 7.5–12.5)
Monocytes Relative: 7 %
Neutro Abs: 5724 cells/uL (ref 1500–7800)
Neutrophils Relative %: 62.9 %
Platelets: 298 10*3/uL (ref 140–400)
RBC: 4.33 10*6/uL (ref 4.20–5.80)
RDW: 12.3 % (ref 11.0–15.0)
Total Lymphocyte: 26.8 %
WBC: 9.1 10*3/uL (ref 3.8–10.8)

## 2019-08-20 LAB — URINALYSIS, ROUTINE W REFLEX MICROSCOPIC
Bilirubin Urine: NEGATIVE
Glucose, UA: NEGATIVE
Hgb urine dipstick: NEGATIVE
Ketones, ur: NEGATIVE
Leukocytes,Ua: NEGATIVE
Nitrite: NEGATIVE
Protein, ur: NEGATIVE
Specific Gravity, Urine: 1.014 (ref 1.001–1.03)
pH: 6.5 (ref 5.0–8.0)

## 2019-08-20 LAB — COMPLETE METABOLIC PANEL WITH GFR
AG Ratio: 1.5 (calc) (ref 1.0–2.5)
ALT: 10 U/L (ref 9–46)
AST: 16 U/L (ref 10–35)
Albumin: 3.9 g/dL (ref 3.6–5.1)
Alkaline phosphatase (APISO): 83 U/L (ref 35–144)
BUN/Creatinine Ratio: 15 (calc) (ref 6–22)
BUN: 23 mg/dL (ref 7–25)
CO2: 33 mmol/L — ABNORMAL HIGH (ref 20–32)
Calcium: 9.7 mg/dL (ref 8.6–10.3)
Chloride: 103 mmol/L (ref 98–110)
Creat: 1.55 mg/dL — ABNORMAL HIGH (ref 0.70–1.11)
GFR, Est African American: 47 mL/min/{1.73_m2} — ABNORMAL LOW (ref >=60)
GFR, Est Non African American: 41 mL/min/{1.73_m2} — ABNORMAL LOW (ref >=60)
Globulin: 2.6 g/dL (calc) (ref 1.9–3.7)
Glucose, Bld: 126 mg/dL — ABNORMAL HIGH (ref 65–99)
Potassium: 4.9 mmol/L (ref 3.5–5.3)
Sodium: 145 mmol/L (ref 135–146)
Total Bilirubin: 0.4 mg/dL (ref 0.2–1.2)
Total Protein: 6.5 g/dL (ref 6.1–8.1)

## 2019-08-20 LAB — URINE CULTURE
MICRO NUMBER:: 10380302
Result:: NO GROWTH
SPECIMEN QUALITY:: ADEQUATE

## 2019-08-20 NOTE — Therapy (Signed)
[facility] [place], [place], [address] Phone: [phone]   Fax:  [phone]  Physical Therapy Treatment  Patient Details  Name: [name] MRN: [id number] Date of Birth: [birth year] Referring Provider (PT): [name], MD   Encounter Date: 08/20/2019  PT End of Session - 08/20/19 1234    Visit Number  2    Number of Visits  16    Date for PT Re-Evaluation  10/10/19    Authorization Type  [employer] MCR, will need KX, had 17 visits earlier in 2021    PT Start Time  [id number]    PT Stop Time  1215    PT Time Calculation (min)  44 min    Activity Tolerance  Patient tolerated treatment well    Behavior During Therapy  [facility] for tasks assessed/performed       Past Medical History:  Diagnosis Date  . 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 Colon polyps   . Coronary atherosclerosis of unspecified type of vessel, native or graft   . Diabetes ([place])   . Difficult intubation   . 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 Esophageal reflux   . High cholesterol   . History of IBS 02/27/2009  . Hypertension    pt denies, he says he has a h/o hypotension. If BP up he adjusts the [place]  . Jejunitis with partial SBO 05/20/2017  . Orthostatic hypotension    "BP has been dropping alot when I stand up for the last month or so" (02/17/2016)  . Prostatitis   . Upper GI bleed 06/24/2018  . Vitamin B 12 deficiency   . Vitamin D deficiency     Past Surgical History:  Procedure Laterality Date  . BIOPSY  06/25/2018   Procedure: BIOPSY;  Surgeon: Rush Landmark Telford Nab., MD;  Location: [place];  Service: Gastroenterology;;  . BIOPSY  04/24/2019   Procedure: BIOPSY;  Surgeon: Irving Copas., MD;  Location: [facility];  Service: Gastroenterology;;  . BLADDER SURGERY  1969   traumatic pelvic fractures, urethral and bladder repair  . CARDIAC CATHETERIZATION N/A 07/01/2015   Procedure: Left Heart Cath and Coronary Angiography;  Surgeon:  Wellington Hampshire, MD;  Location: [facility] [facility];  Service: Cardiovascular;  Laterality: N/A;  . COLON RESECTION N/A 05/17/2017   Procedure: DIAGNOSTIC LAPAROSCOPY,;  Surgeon: Leighton Ruff, MD;  Location: [facility];  Service: General;  Laterality: N/A;  . COLONOSCOPY WITH PROPOFOL N/A 04/24/2019   Procedure: COLONOSCOPY WITH PROPOFOL;  Surgeon: Irving Copas., MD;  Location: [facility];  Service: Gastroenterology;  Laterality: N/A;  . CORONARY ANGIOPLASTY WITH STENT PLACEMENT    . CORONARY ARTERY BYPASS GRAFT N/A 07/06/2015   Procedure: CORONARY ARTERY BYPASS GRAFTING (CABG)x 4   utilizing the left internal mammary artery and endoscopically harvested bilateral  sapheneous vein.;  Surgeon: Ivin Poot, MD;  Location: [place];  Service: Open Heart Surgery;  Laterality: N/A;  . ESOPHAGEAL DILATION  04/24/2019   Procedure: ESOPHAGEAL DILATION;  Surgeon: Rush Landmark Telford Nab., MD;  Location: [facility];  Service: Gastroenterology;;  . ESOPHAGOGASTRODUODENOSCOPY (EGD) WITH PROPOFOL N/A 06/25/2018   Procedure: ESOPHAGOGASTRODUODENOSCOPY (EGD) WITH PROPOFOL;  Surgeon: Irving Copas., MD;  Location: [place];  Service: Gastroenterology;  Laterality: N/A;  . ESOPHAGOGASTRODUODENOSCOPY (EGD) WITH PROPOFOL N/A 04/24/2019   Procedure: ESOPHAGOGASTRODUODENOSCOPY (EGD) WITH PROPOFOL;  Surgeon: Rush Landmark Telford Nab., MD;  Location: [facility];  Service: Gastroenterology;  Laterality: N/A;  . HEMOSTASIS CLIP PLACEMENT  04/24/2019  Procedure: HEMOSTASIS CLIP PLACEMENT;  Surgeon: Irving Copas., MD;  Location: [facility];  Service: Gastroenterology;;  . KNEE SURGERY    . LOOP RECORDER INSERTION N/A 02/19/2018   Procedure: LOOP RECORDER INSERTION;  Surgeon: Deboraha Sprang, MD;  Location: [place] [facility];  Service: Cardiovascular;  Laterality: N/A;  . POLYPECTOMY  04/24/2019   Procedure: POLYPECTOMY;  Surgeon: Rush Landmark Telford Nab., MD;  Location: [facility];   Service: Gastroenterology;;  . Lia Foyer LIFTING INJECTION  04/24/2019   Procedure: SUBMUCOSAL LIFTING INJECTION;  Surgeon: Irving Copas., MD;  Location: [facility];  Service: Gastroenterology;;  . TEE WITHOUT CARDIOVERSION N/A 07/06/2015   Procedure: TRANSESOPHAGEAL ECHOCARDIOGRAM (TEE);  Surgeon: Ivin Poot, MD;  Location: [place];  Service: Open Heart Surgery;  Laterality: N/A;    There were no vitals filed for this visit.  Subjective Assessment - 08/20/19 1133    Subjective  He fell Saturday 4/17 when legs gave out. He denies loss of conciousness.    Pertinent History  PMH: CVA X5, DM, CKD, CAD, CABGX4,    Limitations  Lifting;Standing;Walking;House hold activities    How long can you stand comfortably?  2 min    How long can you walk comfortably?  household distances    Diagnostic tests  Imaging shows "Spinal stenosis of lumbar region with neurogenic claudication, advanced lumbar DDD, Scoliosis"    Patient Stated Goals  stand and walk better, improve leg strength and pain, decrease falls    Currently in Pain?  Yes    Pain Score  5     Pain Location  Back    Pain Orientation  Right    Pain Descriptors / Indicators  Aching;Sore    Pain Type  Chronic pain    Pain Onset  More than a month ago    Pain Frequency  Constant    Aggravating Factors   stenosis / arthritis,    Pain Relieving Factors  medications                       [facility] Adult PT Treatment/Exercise - 08/20/19 1135      Transfers   Transfers  Sit to Stand;Stand to Sit    Sit to Stand  5: Supervision;With upper extremity assist;With armrests;From chair/3-in-1   to RW   Sit to Stand Details  Tactile cues for weight shifting;Verbal cues for technique    Stand to Sit  5: Supervision;With upper extremity assist;With armrests;To chair/3-in-1   from RW for stability   Stand to Sit Details (indicate cue type and reason)  Visual cues for safe use of DME/AE;Tactile cues for weight shifting;Verbal  cues for technique      Ambulation/Gait   Ambulation/Gait  Yes    Ambulation/Gait Assistance  4: Min guard    Ambulation/Gait Assistance Details  verbal & tactile cues on upright posture, only glancing not staring at floor and RW location    Ambulation Distance (Feet)  100 Feet   100' X 2   Assistive device  Rolling walker    Gait Pattern  Step-through pattern;Decreased stride length;Decreased hip/knee flexion - right;Decreased hip/knee flexion - left;Right flexed knee in stance;Left flexed knee in stance;Antalgic;Lateral hip instability;Trunk flexed    Ambulation Surface  Level;Indoor      Exercises   Exercises  Other Exercises      Knee/Hip Exercises: Aerobic   Nustep  level 5 with BUEs & BLEs 5 minutes.       Knee/Hip Exercises: Standing  Heel Raises  Both;10 reps;1 set;2 seconds    Heel Raises Limitations  BUE support on RW cues to use LEs>UEs    Other Standing Knee Exercises  mini squats over chair BUE support on RW cues to use LEs>UEs    Other Standing Knee Exercises  toe raises X 10 reps bilat BUE support on RW cues to use LEs>UEs      Knee/Hip Exercises: Seated   Long Arc Quad  AROM;Right;Left;10 reps    Hamstring Curl  Right;Left;Strengthening;10 reps    Hamstring Limitations  Level 3 theraband          Balance Exercises - 08/20/19 1135      Balance Exercises: Standing   Standing Eyes Opened  Wide (BOA);Head turns;Solid surface;Other reps (comment)   10 reps, No UE support RW close for safety, MinA   Standing Eyes Opened Limitations  minA / tactile cues for balance reactions. head movements right/left, up/down & diagonals.      SLS with Vectors  Solid surface;Upper extremity assist 2;5 reps   RW support   SLS with Vectors Limitations  slider forward, lateral & backwards 5 reps ea LE    Retro Gait  Upper extremity support;5 reps   RW support   Retro Gait Limitations  tactile & verbal cues on upright posture & full steps    Sidestepping  Upper extremity support    10' X 2 at counter top   Sidestepping Limitations  verbal & tactile cues on upright posture    Turning  Right;Left;3 reps;Other (comment)   at counter top, turn 90* right-walk & left-walk   Turning Limitations  verbal cues on picking up feet in turn & upright posture    Other Standing Exercises  standing with posterior pelvis to door frame with feet under pelvis with RW support: single UE & BUEs overhead reach 10 sec ea with cues to use LEs to push upright             PT Long Term Goals - 08/15/19 1153      PT LONG TERM GOAL #1   Title  independent with updated HEP (Target all LTG 8 weeks  10/10/2019)    Time  8    Period  Weeks    Status  New      PT LONG TERM GOAL #2   Title  improve 5TSTS test to less than 21 seconds to show improved leg strength/endurance    Baseline  31    Time  8    Period  Weeks    Status  New      PT LONG TERM GOAL #3   Title  Improve bilat leg strength to at least 4+/5 MMT measured in sitting to improve function    Baseline  3-4 for Lt leg    Time  8    Period  Weeks    Status  New      PT LONG TERM GOAL #4   Title  Will improve 5TSTS to <25 sec to show improved leg strength and endurance. Revised on 06/19/19 to <20 seconds    Baseline  31    Time  8    Period  Weeks    Status  Achieved      PT LONG TERM GOAL #5   Title  Ambulate with LRAD mod I  for 625 ft or stand at least 10 min    Time  8    Period  Weeks    Status  New            Plan - 08/20/19 1235    Clinical Impression Statement  PT session focused on lower extremity strengthening, balance & posture.  Patient fatigues easily requiring frequent rests during session.    Personal Factors and Comorbidities  Comorbidity 3+    Comorbidities  PMH: CVA X5, DM, CKD, CAD, CABGX4,    Examination-Activity Limitations  Bed Mobility;Bend;Carry;Squat;Stairs;Lift;Stand;Transfers;Locomotion Level    Examination-Participation Restrictions  Meal Prep;Cleaning;Community Activity;Shop     Stability/Clinical Decision Making  Evolving/Moderate complexity    Rehab Potential  Fair    PT Frequency  2x / week    PT Duration  8 weeks    PT Treatment/Interventions  ADLs/Self Care Home Management;Cryotherapy;Iontophoresis 4mg /ml Dexamethasone;Moist Heat;Gait training;Stair training;Functional mobility training;Therapeutic activities;Therapeutic exercise;Balance training;Neuromuscular re-education;Manual techniques;Passive range of motion;Taping    PT Next Visit Plan  needs bilat leg strength, gait, balance, endurance    PT Home Exercise Plan  Access Code: [email or web address]    Consulted and Agree with Plan of Care  Patient       Patient will benefit from skilled therapeutic intervention in order to improve the following deficits and impairments:  Abnormal gait, Decreased activity tolerance, Decreased balance, Decreased endurance, Decreased range of motion, Decreased strength, Hypomobility, Difficulty walking, Impaired flexibility, Postural dysfunction, Pain  Visit Diagnosis: Other abnormalities of gait and mobility  Muscle weakness (generalized)  Chronic bilateral low back pain, unspecified whether sciatica present  Chronic bilateral low back pain with right-sided sciatica  Other symptoms and signs involving the nervous system     Problem List Patient Active Problem List   Diagnosis Date Noted  . At high risk for falls 05/29/2019  . Chronic bilateral low back pain without sciatica 02/07/2019  . Statin intolerance 11/16/2018  . Hyperlipidemia, mixed 08/15/2018  . Low back pain 07/18/2018  . History of lacunar cerebrovascular accident (CVA) 02/12/2018  . Dizziness 02/12/2018  . Myofascial pain 12/18/2017  . Spondylosis without myelopathy or radiculopathy, lumbar region 12/18/2017  . Lumbar radiculopathy, right 12/18/2017  . Cholelithiasis 05/17/2017  . Sinus Bradycardia 05/17/2017  . Thoracic radiculopathy 04/12/2017  . Primary osteoarthritis of right knee 12/14/2016  . DDD  (degenerative disc disease), lumbar 10/10/2016  . Lumbar facet arthropathy 08/29/2016  . Idiopathic scoliosis 03/22/2016  . Diabetic neuropathy ([place]) 03/22/2016  . Dysautonomia orthostatic hypotension syndrome ([place]) 02/25/2016  . CKD stage 3 due to type 2 diabetes mellitus ([place]) 02/19/2016  . Coronary artery disease involving coronary bypass graft of native heart without angina pectoris   . Diabetes mellitus type 2, diet-controlled ([place])   . 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 Medication management 08/11/2015  . 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 PVD (peripheral vascular disease) ([place]) 01/01/2014  . Hyperlipidemia associated with type 2 diabetes mellitus ([place]) 04/11/2013  . Supine hypertension   . Vitamin D deficiency   . Vitamin B 12 deficiency   . Coronary atherosclerosis- s/p PCI to LAD in 2009 and PCI to RCA in 2011 02/27/2009  . Abdominal aortic aneurysm ([place]) 02/27/2009  . GERD 02/27/2009    Jamey Reas  PT, DPT 08/20/2019, 12:56 PM  [facility] [address] [place], [place], [address] Phone: [phone]   Fax:  [phone]  Name: [name] MRN: [id number] Date of Birth: December 27, 1933

## 2019-08-21 ENCOUNTER — Other Ambulatory Visit: Payer: Self-pay | Admitting: Internal Medicine

## 2019-08-21 DIAGNOSIS — M5414 Radiculopathy, thoracic region: Secondary | ICD-10-CM

## 2019-08-21 NOTE — Progress Notes (Signed)
08/21/2019--Patient's wife is aware of his lab results and all instructions. -Marcelino Scot

## 2019-08-28 ENCOUNTER — Encounter: Payer: Self-pay | Admitting: Physical Therapy

## 2019-08-28 ENCOUNTER — Other Ambulatory Visit: Payer: Self-pay

## 2019-08-28 ENCOUNTER — Ambulatory Visit (INDEPENDENT_AMBULATORY_CARE_PROVIDER_SITE_OTHER): Payer: Medicare Other | Admitting: Physical Therapy

## 2019-08-28 ENCOUNTER — Encounter: Payer: Self-pay | Admitting: Internal Medicine

## 2019-08-28 DIAGNOSIS — M5441 Lumbago with sciatica, right side: Secondary | ICD-10-CM

## 2019-08-28 DIAGNOSIS — R29818 Other symptoms and signs involving the nervous system: Secondary | ICD-10-CM | POA: Diagnosis not present

## 2019-08-28 DIAGNOSIS — R42 Dizziness and giddiness: Secondary | ICD-10-CM

## 2019-08-28 DIAGNOSIS — G8929 Other chronic pain: Secondary | ICD-10-CM

## 2019-08-28 DIAGNOSIS — R2689 Other abnormalities of gait and mobility: Secondary | ICD-10-CM

## 2019-08-28 DIAGNOSIS — M6281 Muscle weakness (generalized): Secondary | ICD-10-CM

## 2019-08-28 NOTE — Patient Instructions (Signed)

## 2019-08-28 NOTE — Therapy (Signed)
[facility] [address] [place], [place], [address] Phone: [phone]   Fax:  [phone]  Physical Therapy Treatment  Patient Details  Name: [name] MRN: [id number] Date of Birth: [birth year] Referring Provider (PT): [name], MD   Encounter Date: 08/28/2019  PT End of Session - 08/28/19 1222    Visit Number  3    Number of Visits  16    Date for PT Re-Evaluation  10/10/19    Authorization Type  [employer] MCR, will need KX, had 17 visits earlier in 2021    PT Start Time  1140    PT Stop Time  1220    PT Time Calculation (min)  40 min    Activity Tolerance  Patient limited by fatigue;Patient limited by pain    Behavior During Therapy  [facility] for tasks assessed/performed       Past Medical History:  Diagnosis Date  . 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 Colon polyps   . Coronary atherosclerosis of unspecified type of vessel, native or graft   . Diabetes ([place])   . Difficult intubation   . 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 Esophageal reflux   . High cholesterol   . History of IBS 02/27/2009  . Hypertension    pt denies, he says he has a h/o hypotension. If BP up he adjusts the [place]  . Jejunitis with partial SBO 05/20/2017  . Orthostatic hypotension    "BP has been dropping alot when I stand up for the last month or so" (02/17/2016)  . Prostatitis   . Upper GI bleed 06/24/2018  . Vitamin B 12 deficiency   . Vitamin D deficiency     Past Surgical History:  Procedure Laterality Date  . BIOPSY  06/25/2018   Procedure: BIOPSY;  Surgeon: Rush Landmark Telford Nab., MD;  Location: [place];  Service: Gastroenterology;;  . BIOPSY  04/24/2019   Procedure: BIOPSY;  Surgeon: Irving Copas., MD;  Location: [facility];  Service: Gastroenterology;;  . BLADDER SURGERY  1969   traumatic pelvic fractures, urethral and bladder repair  . CARDIAC CATHETERIZATION N/A 07/01/2015   Procedure: Left Heart Cath and Coronary  Angiography;  Surgeon: Wellington Hampshire, MD;  Location: [place] [facility];  Service: Cardiovascular;  Laterality: N/A;  . COLON RESECTION N/A 05/17/2017   Procedure: DIAGNOSTIC LAPAROSCOPY,;  Surgeon: Leighton Ruff, MD;  Location: [facility];  Service: General;  Laterality: N/A;  . COLONOSCOPY WITH PROPOFOL N/A 04/24/2019   Procedure: COLONOSCOPY WITH PROPOFOL;  Surgeon: Irving Copas., MD;  Location: [facility];  Service: Gastroenterology;  Laterality: N/A;  . CORONARY ANGIOPLASTY WITH STENT PLACEMENT    . CORONARY ARTERY BYPASS GRAFT N/A 07/06/2015   Procedure: CORONARY ARTERY BYPASS GRAFTING (CABG)x 4   utilizing the left internal mammary artery and endoscopically harvested bilateral  sapheneous vein.;  Surgeon: Ivin Poot, MD;  Location: [place];  Service: Open Heart Surgery;  Laterality: N/A;  . ESOPHAGEAL DILATION  04/24/2019   Procedure: ESOPHAGEAL DILATION;  Surgeon: Rush Landmark Telford Nab., MD;  Location: [facility];  Service: Gastroenterology;;  . ESOPHAGOGASTRODUODENOSCOPY (EGD) WITH PROPOFOL N/A 06/25/2018   Procedure: ESOPHAGOGASTRODUODENOSCOPY (EGD) WITH PROPOFOL;  Surgeon: Irving Copas., MD;  Location: [facility];  Service: Gastroenterology;  Laterality: N/A;  . ESOPHAGOGASTRODUODENOSCOPY (EGD) WITH PROPOFOL N/A 04/24/2019   Procedure: ESOPHAGOGASTRODUODENOSCOPY (EGD) WITH PROPOFOL;  Surgeon: Rush Landmark Telford Nab., MD;  Location: [facility];  Service: Gastroenterology;  Laterality: N/A;  . HEMOSTASIS CLIP PLACEMENT  04/24/2019   Procedure: HEMOSTASIS CLIP PLACEMENT;  Surgeon: Irving Copas., MD;  Location: [facility];  Service: Gastroenterology;;  . KNEE SURGERY    . LOOP RECORDER INSERTION N/A 02/19/2018   Procedure: LOOP RECORDER INSERTION;  Surgeon: Deboraha Sprang, MD;  Location: [place];  Service: Cardiovascular;  Laterality: N/A;  . POLYPECTOMY  04/24/2019   Procedure: POLYPECTOMY;  Surgeon: Rush Landmark Telford Nab., MD;   Location: [facility];  Service: Gastroenterology;;  . Lia Foyer LIFTING INJECTION  04/24/2019   Procedure: SUBMUCOSAL LIFTING INJECTION;  Surgeon: Irving Copas., MD;  Location: [facility];  Service: Gastroenterology;;  . TEE WITHOUT CARDIOVERSION N/A 07/06/2015   Procedure: TRANSESOPHAGEAL ECHOCARDIOGRAM (TEE);  Surgeon: Ivin Poot, MD;  Location: [place];  Service: Open Heart Surgery;  Laterality: N/A;    There were no vitals filed for this visit.  Subjective Assessment - 08/28/19 1207    Subjective  He fell again 4/16 when he was trying to take off his shift standing up and lost balance He denies loss of conciousness or hitting his head but does have some 6/10 pain in his Rt hip.    Pertinent History  PMH: CVA X5, DM, CKD, CAD, CABGX4,    Limitations  Lifting;Standing;Walking;House hold activities    How long can you stand comfortably?  2 min    How long can you walk comfortably?  household distances    Diagnostic tests  Imaging shows "Spinal stenosis of lumbar region with neurogenic claudication, advanced lumbar DDD, Scoliosis"    Patient Stated Goals  stand and walk better, improve leg strength and pain, decrease falls    Pain Onset  More than a month ago         [facility] PT Assessment - 08/28/19 0001      Assessment   Medical Diagnosis  bilat leg weakness, severe spinal stenosis, falls    Referring Provider (PT)  [name], MD      Balance Screen   Has the patient fallen in the past 6 months  Yes    How many times?  4    Has the patient had a decrease in activity level because of a fear of falling?   Yes    Is the patient reluctant to leave their home because of a fear of falling?   Yes      Strength   Right Hip Flexion  3/5    Right Hip ABduction  3/5    Right Knee Flexion  4/5    Right Knee Extension  4/5                   [facility] Adult PT Treatment/Exercise - 08/28/19 0001      Transfers   Transfers  Sit to Stand;Stand to Sit;Stand Pivot  Transfers    Sit to Stand  5: Supervision;With upper extremity assist;With armrests;From chair/3-in-1    Stand to Sit  5: Supervision;With upper extremity assist;With armrests;To chair/3-in-1    Stand to Sit Details (indicate cue type and reason)  Visual cues for safe use of DME/AE;Tactile cues for weight shifting;Verbal cues for technique    Stand Pivot Transfers  5: Supervision    Stand Pivot Transfer Details (indicate cue type and reason)  with RW      Ambulation/Gait   Ambulation/Gait  Yes    Ambulation/Gait Assistance  4: Min guard    Ambulation Distance (Feet)  100 Feet   X2   Assistive device  Rolling walker  Gait Pattern  Step-through pattern;Decreased stride length;Decreased hip/knee flexion - right;Decreased hip/knee flexion - left;Right flexed knee in stance;Left flexed knee in stance;Antalgic;Lateral hip instability;Trunk flexed      Exercises   Other Exercises   Nu step warm up L5 X 5 min UE/LE. Seated LAQ 2 lbs 3X10 marches, seated marches 2 lbs 3X10 bilat. Sit to stands with UE support and RW 3X5, standing Heel toe raises 2X10                  PT Long Term Goals - 08/28/19 1226      PT LONG TERM GOAL #1   Title  independent with updated HEP (Target all LTG 8 weeks  10/10/2019)    Time  8    Period  Weeks    Status  New      PT LONG TERM GOAL #2   Title  improve 5TSTS test to less than 21 seconds to show improved leg strength/endurance    Baseline  31    Time  8    Period  Weeks    Status  New      PT LONG TERM GOAL #3   Title  Improve bilat leg strength to at least 4+/5 MMT measured in sitting to improve function    Baseline  3-4 for Lt leg    Time  8    Period  Weeks    Status  New      PT LONG TERM GOAL #4   Title  Will improve 5TSTS to <25 sec to show improved leg strength and endurance. Revised on 06/19/19 to <20 seconds    Baseline  31    Time  8    Period  Weeks    Status  Achieved      PT LONG TERM GOAL #5   Title  Ambulate with LRAD  mod I  for 625 ft or stand at least 10 min    Time  8    Period  Weeks    Status  New            Plan - 08/28/19 1222    Clinical Impression Statement  Having more Rt hip today after falling last night, he is able to stand, walk on it, and perform light exericise without much more pain but his step length is limited a little more due to pain. Xray likely not recommended at this time but explained to him and his wife he he continues to have pain or pain severeness intensifies that going to see MD would be waranted.    Personal Factors and Comorbidities  Comorbidity 3+    Comorbidities  PMH: CVA X5, DM, CKD, CAD, CABGX4,    Examination-Activity Limitations  Bed Mobility;Bend;Carry;Squat;Stairs;Lift;Stand;Transfers;Locomotion Level    Examination-Participation Restrictions  Meal Prep;Cleaning;Community Activity;Shop    Stability/Clinical Decision Making  Evolving/Moderate complexity    Rehab Potential  Fair    PT Frequency  2x / week    PT Duration  8 weeks    PT Treatment/Interventions  ADLs/Self Care Home Management;Cryotherapy;Iontophoresis 4mg /ml Dexamethasone;Moist Heat;Gait training;Stair training;Functional mobility training;Therapeutic activities;Therapeutic exercise;Balance training;Neuromuscular re-education;Manual techniques;Passive range of motion;Taping    PT Next Visit Plan  how does his hip feel. needs bilat leg strength, gait, balance, endurance    PT Home Exercise Plan  Access Code: [facility]    Consulted and Agree with Plan of Care  Patient       Patient will benefit from skilled therapeutic intervention in order to  improve the following deficits and impairments:  Abnormal gait, Decreased activity tolerance, Decreased balance, Decreased endurance, Decreased range of motion, Decreased strength, Hypomobility, Difficulty walking, Impaired flexibility, Postural dysfunction, Pain  Visit Diagnosis: Other abnormalities of gait and mobility  Muscle weakness  (generalized)  Chronic bilateral low back pain with right-sided sciatica  Other symptoms and signs involving the nervous system  Dizziness and giddiness     Problem List Patient Active Problem List   Diagnosis Date Noted  . At high risk for falls 05/29/2019  . Chronic bilateral low back pain without sciatica 02/07/2019  . Statin intolerance 11/16/2018  . Hyperlipidemia, mixed 08/15/2018  . Low back pain 07/18/2018  . History of lacunar cerebrovascular accident (CVA) 02/12/2018  . Dizziness 02/12/2018  . Myofascial pain 12/18/2017  . Spondylosis without myelopathy or radiculopathy, lumbar region 12/18/2017  . Lumbar radiculopathy, right 12/18/2017  . Cholelithiasis 05/17/2017  . Sinus Bradycardia 05/17/2017  . Thoracic radiculopathy 04/12/2017  . Primary osteoarthritis of right knee 12/14/2016  . DDD (degenerative disc disease), lumbar 10/10/2016  . Lumbar facet arthropathy 08/29/2016  . Idiopathic scoliosis 03/22/2016  . Diabetic neuropathy ([place]) 03/22/2016  . Dysautonomia orthostatic hypotension syndrome ([place]) 02/25/2016  . CKD stage 3 due to type 2 diabetes mellitus ([place]) 02/19/2016  . Coronary artery disease involving coronary bypass graft of native heart without angina pectoris   . Diabetes mellitus type 2, diet-controlled ([place])   . 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 Medication management 08/11/2015  . 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 PVD (peripheral vascular disease) ([place]) 01/01/2014  . Hyperlipidemia associated with type 2 diabetes mellitus ([place]) 04/11/2013  . Supine hypertension   . Vitamin D deficiency   . Vitamin B 12 deficiency   . Coronary atherosclerosis- s/p PCI to LAD in 2009 and PCI to RCA in 2011 02/27/2009  . Abdominal aortic aneurysm ([place]) 02/27/2009  . GERD 02/27/2009    Silvestre Mesi 08/28/2019, 12:28 PM  [facility] [address] [place], [place], [address] Phone: [phone]   Fax:  [phone]  Name:  [name] MRN: [id number] Date of Birth: 14-Dec-1933

## 2019-08-28 NOTE — Progress Notes (Signed)
History of Present Illness:      This very nice 86 y.o. MWM  presents for 6 month follow up with Dysautonomia withlabile Supine HTN &Postural Hypotension, HLD, ASCAD /CABG,ASCVD / Hx CVA's, T2_DM,Vitamin D Deficiency. He's been followed by Pain Mgmt on Lyrica for Thoracic Radiculitis with break-thru pains.      Patient is treated for labile HTN (1995) abd subsequent labile postural hypotension over the last several years requiring  Florinef.  Today's BP - 124/80-sit and 98/58- stand.   In 2005 and 2011, he underwent PCA/Stents and then CABG in 2017.  In Sept 2019,  he had a Right Temporal stem CVA & was started on Plavix with his Low Dose bASA 81 mg. He has a known 3.8 cm AAA.  Patient has had no complaints of any cardiac type chest pain, palpitations, dyspnea / orthopnea / PND, dizziness, claudication, or dependent edema.      Patient has hx/o Statin Intolerance and his Hyperlipidemia is not  controlled with diet & meds. Patient denies myalgias or other med SE's. Last Lipids were not at goal:  Lab Results  Component Value Date   CHOL 221 (H) 05/29/2019   HDL 32 (L) 05/29/2019   LDLCALC 153 (H) 05/29/2019   TRIG 223 (H) 05/29/2019   CHOLHDL 6.9 (H) 05/29/2019    Also, the patient has history of PreDiabetes (2008) and now T2_NIDDM  (A1c 7.3%  /  Jan 2021) with CKD 3a (GFR 46) and he's  had no symptoms of reactive hypoglycemia, diabetic polys, paresthesias or visual blurring.  Last A1c was not at goal:  Lab Results  Component Value Date   HGBA1C 7.3 (H) 05/29/2019           Further, the patient also has history of Vitamin D Deficiency and supplements vitamin D without any suspected side-effects. Last vitamin D was at goal:  Lab Results  Component Value Date   VD25OH 85 02/27/2019    Current Outpatient Medications on File Prior to Visit  Medication Sig  . acetaminophen (TYLENOL) 500 MG tablet Take 1,000 mg by mouth 2 (two) times daily as needed for headache (pain).   Marland Kitchen  amLODipine (NORVASC) 10 MG tablet Take 1/2 to 1 tablet daily for BP (Patient taking differently: Take 10 mg by mouth daily. )  . Ascorbic Acid (VITAMIN C) 500 MG CAPS Take 500 mg by mouth daily.   Marland Kitchen aspirin EC 81 MG tablet Take 1 tablet (81 mg total) by mouth daily.  . butalbital-acetaminophen-caffeine (FIORICET) 50-325-40 MG tablet TAKE 1 TABLET EVERY 4 HOURS ONLY IF SEVERE HEADACHE  . Calcium Carb-Cholecalciferol (CALCIUM+D3 PO) Take 1 tablet by mouth daily.  . chlorzoxazone (PARAFON FORTE DSC) 500 MG tablet Take 1 tablet (500 mg total) by mouth every 6 (six) hours as needed for muscle spasms.  . Cholecalciferol (VITAMIN D-3) 5000 units TABS Take 5,000 Units by mouth daily.  . Ferrous Sulfate (IRON SLOW RELEASE) 140 (45 Fe) MG TBCR Take 1 tablet by mouth daily.  . fludrocortisone (FLORINEF) 0.1 MG tablet Take 2 tablets Daily for Low BP  . glucose blood test strip Check blood sugar 1 time daily-[id number]  . montelukast (SINGULAIR) 10 MG tablet Take 1 tablet  Daily for Allergies & Asthma  . Multiple Vitamin (MULTIVITAMIN WITH MINERALS) TABS tablet Take 1 tablet by mouth daily.  . Omega-3 Fatty Acids (FISH OIL OMEGA-3 PO) Take 1 capsule by mouth daily.  . pantoprazole (PROTONIX) 40 MG tablet Take 1 tablet  Daily for Indigestion & Heartburn (Patient taking differently: Take 40 mg by mouth daily. )  . polyethylene glycol (MIRALAX / GLYCOLAX) packet Take 17 g by mouth daily.  . pregabalin (LYRICA) 50 MG capsule TAKE 1 CAPSULE 3 TIMES A DAY FOR NERVE PAIN  . sulfamethoxazole-trimethoprim (BACTRIM DS) 800-160 MG tablet Take 1 tablet by mouth 2 (two) times daily.  . Zinc 50 MG TABS Take 1 tablet by mouth daily.  Marland Kitchen amitriptyline (ELAVIL) 10 MG tablet Take 1 tablet (10 mg total) by mouth at bedtime. (Patient not taking: Reported on 08/29/2019)  . Magnesium 500 MG TABS Take 500 mg by mouth daily.    No current facility-administered medications on file prior to visit.    Allergies  Allergen Reactions  .  Cymbalta [Duloxetine Hcl] Other (See Comments)    Dizziness, hallucinations.   . Keflex [Cephalexin] Other (See Comments)    dizziness  . Simvastatin Other (See Comments)    Joint pain  . Sudafed [Pseudoephedrine] Other (See Comments)    Dizziness     PMHx:   Past Medical History:  Diagnosis Date  . 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 Colon polyps   . Coronary atherosclerosis of unspecified type of vessel, native or graft   . Diabetes ([place])   . Difficult intubation   . 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 Esophageal reflux   . High cholesterol   . History of IBS 02/27/2009  . Hypertension    pt denies, he says he has a h/o hypotension. If BP up he adjusts the Florinef  . Jejunitis with partial SBO 05/20/2017  . Orthostatic hypotension    "BP has been dropping alot when I stand up for the last month or so" (02/17/2016)  . Prostatitis   . Upper GI bleed 06/24/2018  . Vitamin B 12 deficiency   . Vitamin D deficiency     Immunization History  Administered Date(s) Administered  . Influenza, High Dose Seasonal PF 12/30/2013, 02/02/2015, 01/27/2016, 02/20/2019  . Influenza-Unspecified 01/13/2013, 01/29/2017, 01/30/2018  . PFIZER SARS-COV-2 Vaccination 05/21/2019, 06/11/2019  . Pneumococcal Conjugate-13 02/14/2014  . Pneumococcal-Unspecified 06/15/2004  . Tetanus 10/01/2012  . Zoster 10/01/2012    Past Surgical History:  Procedure Laterality Date  . BIOPSY  06/25/2018   Procedure: BIOPSY;  Surgeon: Rush Landmark Telford Nab., MD;  Location: [place];  Service: Gastroenterology;;  . BIOPSY  04/24/2019   Procedure: BIOPSY;  Surgeon: Irving Copas., MD;  Location: [facility];  Service: Gastroenterology;;  . BLADDER SURGERY  1969   traumatic pelvic fractures, urethral and bladder repair  . CARDIAC CATHETERIZATION N/A 07/01/2015   Procedure: Left Heart Cath and Coronary Angiography;  Surgeon: Wellington Hampshire, MD;  Location: [facility];  Service: Cardiovascular;   Laterality: N/A;  . COLON RESECTION N/A 05/17/2017   Procedure: DIAGNOSTIC LAPAROSCOPY,;  Surgeon: Leighton Ruff, MD;  Location: [facility];  Service: General;  Laterality: N/A;  . COLONOSCOPY WITH PROPOFOL N/A 04/24/2019   Procedure: COLONOSCOPY WITH PROPOFOL;  Surgeon: Irving Copas., MD;  Location: [facility];  Service: Gastroenterology;  Laterality: N/A;  . CORONARY ANGIOPLASTY WITH STENT PLACEMENT    . CORONARY ARTERY BYPASS GRAFT N/A 07/06/2015   Procedure: CORONARY ARTERY BYPASS GRAFTING (CABG)x 4   utilizing the left internal mammary artery and endoscopically harvested bilateral  sapheneous vein.;  Surgeon: Ivin Poot, MD;  Location: [place];  Service: Open Heart Surgery;  Laterality: N/A;  . ESOPHAGEAL DILATION  04/24/2019   Procedure: ESOPHAGEAL  DILATION;  Surgeon: Irving Copas., MD;  Location: [facility];  Service: Gastroenterology;;  . ESOPHAGOGASTRODUODENOSCOPY (EGD) WITH PROPOFOL N/A 06/25/2018   Procedure: ESOPHAGOGASTRODUODENOSCOPY (EGD) WITH PROPOFOL;  Surgeon: Irving Copas., MD;  Location: [place];  Service: Gastroenterology;  Laterality: N/A;  . ESOPHAGOGASTRODUODENOSCOPY (EGD) WITH PROPOFOL N/A 04/24/2019   Procedure: ESOPHAGOGASTRODUODENOSCOPY (EGD) WITH PROPOFOL;  Surgeon: Rush Landmark Telford Nab., MD;  Location: [facility];  Service: Gastroenterology;  Laterality: N/A;  . HEMOSTASIS CLIP PLACEMENT  04/24/2019   Procedure: HEMOSTASIS CLIP PLACEMENT;  Surgeon: Irving Copas., MD;  Location: [facility];  Service: Gastroenterology;;  . KNEE SURGERY    . LOOP RECORDER INSERTION N/A 02/19/2018   Procedure: LOOP RECORDER INSERTION;  Surgeon: Deboraha Sprang, MD;  Location: [facility];  Service: Cardiovascular;  Laterality: N/A;  . POLYPECTOMY  04/24/2019   Procedure: POLYPECTOMY;  Surgeon: Rush Landmark Telford Nab., MD;  Location: [facility];  Service: Gastroenterology;;  . Lia Foyer LIFTING INJECTION  04/24/2019    Procedure: SUBMUCOSAL LIFTING INJECTION;  Surgeon: Irving Copas., MD;  Location: [facility];  Service: Gastroenterology;;  . TEE WITHOUT CARDIOVERSION N/A 07/06/2015   Procedure: TRANSESOPHAGEAL ECHOCARDIOGRAM (TEE);  Surgeon: Ivin Poot, MD;  Location: [place];  Service: Open Heart Surgery;  Laterality: N/A;    FHx:    Reviewed / unchanged  SHx:    Reviewed / unchanged   Systems Review:  Constitutional: Denies fever, chills, wt changes, headaches, insomnia, fatigue, night sweats, change in appetite. Eyes: Denies redness, blurred vision, diplopia, discharge, itchy, watery eyes.  ENT: Denies discharge, congestion, post nasal drip, epistaxis, sore throat, earache, hearing loss, dental pain, tinnitus, vertigo, sinus pain, snoring.  CV: Denies chest pain, palpitations, irregular heartbeat, syncope, dyspnea, diaphoresis, orthopnea, PND, claudication or edema. Respiratory: denies cough, dyspnea, DOE, pleurisy, hoarseness, laryngitis, wheezing.  Gastrointestinal: Denies dysphagia, odynophagia, heartburn, reflux, water brash, abdominal pain or cramps, nausea, vomiting, bloating, diarrhea, constipation, hematemesis, melena, hematochezia  or hemorrhoids. Genitourinary: Denies dysuria, frequency, urgency, nocturia, hesitancy, discharge, hematuria or flank pain. Musculoskeletal: Denies arthralgias, myalgias, stiffness, jt. swelling, pain, limping or strain/sprain.  Skin: Denies pruritus, rash, hives, warts, acne, eczema or change in skin lesion(s). Neuro: No weakness, tremor, incoordination, spasms, paresthesia or pain. Psychiatric: Denies confusion, memory loss or sensory loss. Endo: Denies change in weight, skin or hair change.  Heme/Lymph: No excessive bleeding, bruising or enlarged lymph nodes.  Physical Exam  BP 124/80 -sit and 98/58- stand  P 88   T 97.2 F    R 16   Ht 6'  Wt 186 lb    BMI 25.31  Appears  well nourished, well groomed  and in no distress.  Eyes: PERRLA,  EOMs, conjunctiva no swelling or erythema. Sinuses: No frontal/maxillary tenderness ENT/Mouth: EAC's clear, TM's nl w/o erythema, bulging. Nares clear w/o erythema, swelling, exudates. Oropharynx clear without erythema or exudates. Oral hygiene is good. Tongue normal, non obstructing. Hearing intact.  Neck: Supple. Thyroid not palpable. Car 2+/2+ without bruits, nodes or JVD. Chest: Respirations nl with BS clear & equal w/o rales, rhonchi, wheezing or stridor.  Cor: Heart sounds normal w/ regular rate and rhythm without sig. murmurs, gallops, clicks or rubs. Peripheral pulses normal and equal  without edema.  Abdomen: Soft & bowel sounds normal. Non-tender w/o guarding, rebound, hernias, masses or organomegaly.  Lymphatics: Unremarkable.  Musculoskeletal: Full ROM all peripheral extremities, joint stability, 5/5 strength and normal gait.  Skin: Warm, dry without exposed rashes, lesions or ecchymosis apparent.  Neuro: Cranial nerves  intact, reflexes equal bilaterally. Sensory-motor testing grossly intact. Tendon reflexes grossly intact.  Pysch: Alert & oriented x 3.  Insight and judgement nl & appropriate. No ideations.  Assessment and Plan:  1. Labile hypertension  - Continue medication, monitor blood pressure at home.  - Continue DASH diet.  Reminder to go to the ER if any CP,  SOB, nausea, dizziness, severe HA, changes vision/speech.  - CBC with Differential/Platelet - COMPLETE METABOLIC PANEL WITH GFR - Magnesium - TSH  2. Dysautonomia orthostatic hypotension syndrome (HCC)   3. Hyperlipidemia associated with type 2 diabetes mellitus ([place])  - Continue diet/meds, exercise,& lifestyle modifications.  - Continue monitor periodic cholesterol/liver & renal functions   - Lipid panel - TSH - Hemoglobin A1c - Insulin, random  4. Type 2 diabetes mellitus with stage 3a chronic kidney disease,  without long-term current use of insulin (HCC)  - Continue diet, exercise  - Lifestyle  modifications.  - Monitor appropriate labs. - Hemoglobin A1c - Insulin, random  5. Poorly controlled type 2 diabetes mellitus with renal complication (HCC)  - Hemoglobin A1c - Insulin, random  6. Vitamin D deficiency  - Continue supplementation.  - VITAMIN D 25 Hydroxy  7. Medication management  - CBC with Differential/Platelet - COMPLETE METABOLIC PANEL WITH GFR - Magnesium - Lipid panel - TSH - Hemoglobin A1c - Insulin, random - VITAMIN D 25 Hydroxy      Discussed  regular exercise, BP monitoring, weight control to achieve/maintain BMI less than 25 and discussed med and SE's. Recommended labs to assess and monitor clinical status with further disposition pending results of labs.  I discussed the assessment and treatment plan with the patient. The patient was provided an opportunity to ask questions and all were answered. The patient agreed with the plan and demonstrated an understanding of the instructions.  I provided over 30 minutes of exam, counseling, chart review and  complex critical decision making.      Increased Lyrica from 50 to 100 mg tid for his radicular pains  Kirtland Bouchard, MD

## 2019-08-29 ENCOUNTER — Other Ambulatory Visit: Payer: Self-pay

## 2019-08-29 ENCOUNTER — Ambulatory Visit: Payer: Medicare Other | Admitting: Internal Medicine

## 2019-08-29 ENCOUNTER — Other Ambulatory Visit: Payer: Self-pay | Admitting: Specialist

## 2019-08-29 VITALS — BP 124/80 | HR 88 | Temp 97.2°F | Resp 16 | Ht 72.0 in | Wt 186.6 lb

## 2019-08-29 DIAGNOSIS — G903 Multi-system degeneration of the autonomic nervous system: Secondary | ICD-10-CM | POA: Diagnosis not present

## 2019-08-29 DIAGNOSIS — I951 Orthostatic hypotension: Secondary | ICD-10-CM

## 2019-08-29 DIAGNOSIS — E1169 Type 2 diabetes mellitus with other specified complication: Secondary | ICD-10-CM | POA: Diagnosis not present

## 2019-08-29 DIAGNOSIS — Z79899 Other long term (current) drug therapy: Secondary | ICD-10-CM

## 2019-08-29 DIAGNOSIS — R0989 Other specified symptoms and signs involving the circulatory and respiratory systems: Secondary | ICD-10-CM | POA: Diagnosis not present

## 2019-08-29 DIAGNOSIS — E1121 Type 2 diabetes mellitus with diabetic nephropathy: Secondary | ICD-10-CM | POA: Diagnosis not present

## 2019-08-29 DIAGNOSIS — M5414 Radiculopathy, thoracic region: Secondary | ICD-10-CM

## 2019-08-29 DIAGNOSIS — E1122 Type 2 diabetes mellitus with diabetic chronic kidney disease: Secondary | ICD-10-CM

## 2019-08-29 DIAGNOSIS — N1831 Chronic kidney disease, stage 3a: Secondary | ICD-10-CM

## 2019-08-29 DIAGNOSIS — E1342 Other specified diabetes mellitus with diabetic polyneuropathy: Secondary | ICD-10-CM

## 2019-08-29 DIAGNOSIS — E1165 Type 2 diabetes mellitus with hyperglycemia: Secondary | ICD-10-CM

## 2019-08-29 DIAGNOSIS — E785 Hyperlipidemia, unspecified: Secondary | ICD-10-CM

## 2019-08-29 DIAGNOSIS — E1129 Type 2 diabetes mellitus with other diabetic kidney complication: Secondary | ICD-10-CM

## 2019-08-29 DIAGNOSIS — E559 Vitamin D deficiency, unspecified: Secondary | ICD-10-CM

## 2019-08-29 MED ORDER — PREGABALIN 100 MG PO CAPS: ORAL_CAPSULE | ORAL | 1 refills | Status: DC

## 2019-08-30 ENCOUNTER — Ambulatory Visit (INDEPENDENT_AMBULATORY_CARE_PROVIDER_SITE_OTHER): Payer: Medicare Other | Admitting: Physical Therapy

## 2019-08-30 ENCOUNTER — Other Ambulatory Visit: Payer: Self-pay

## 2019-08-30 DIAGNOSIS — R29818 Other symptoms and signs involving the nervous system: Secondary | ICD-10-CM

## 2019-08-30 DIAGNOSIS — M5441 Lumbago with sciatica, right side: Secondary | ICD-10-CM

## 2019-08-30 DIAGNOSIS — M6281 Muscle weakness (generalized): Secondary | ICD-10-CM

## 2019-08-30 DIAGNOSIS — R2689 Other abnormalities of gait and mobility: Secondary | ICD-10-CM

## 2019-08-30 DIAGNOSIS — R42 Dizziness and giddiness: Secondary | ICD-10-CM

## 2019-08-30 DIAGNOSIS — G8929 Other chronic pain: Secondary | ICD-10-CM

## 2019-08-30 LAB — COMPLETE METABOLIC PANEL WITH GFR
AG Ratio: 1.7 (calc) (ref 1.0–2.5)
ALT: 10 U/L (ref 9–46)
AST: 20 U/L (ref 10–35)
Albumin: 4.1 g/dL (ref 3.6–5.1)
Alkaline phosphatase (APISO): 83 U/L (ref 35–144)
BUN/Creatinine Ratio: 15 (calc) (ref 6–22)
BUN: 30 mg/dL — ABNORMAL HIGH (ref 7–25)
CO2: 29 mmol/L (ref 20–32)
Calcium: 9.7 mg/dL (ref 8.6–10.3)
Chloride: 104 mmol/L (ref 98–110)
Creat: 2.02 mg/dL — ABNORMAL HIGH (ref 0.70–1.11)
GFR, Est African American: 34 mL/min/{1.73_m2} — ABNORMAL LOW (ref >=60)
GFR, Est Non African American: 29 mL/min/{1.73_m2} — ABNORMAL LOW (ref >=60)
Globulin: 2.4 g/dL (calc) (ref 1.9–3.7)
Glucose, Bld: 142 mg/dL — ABNORMAL HIGH (ref 65–99)
Potassium: 5 mmol/L (ref 3.5–5.3)
Sodium: 142 mmol/L (ref 135–146)
Total Bilirubin: 0.4 mg/dL (ref 0.2–1.2)
Total Protein: 6.5 g/dL (ref 6.1–8.1)

## 2019-08-30 LAB — VITAMIN D 25 HYDROXY (VIT D DEFICIENCY, FRACTURES): Vit D, 25-Hydroxy: 88 ng/mL (ref 30–100)

## 2019-08-30 LAB — MAGNESIUM: Magnesium: 2.4 mg/dL (ref 1.5–2.5)

## 2019-08-30 LAB — HEMOGLOBIN A1C
Hgb A1c MFr Bld: 6.2 % of total Hgb — ABNORMAL HIGH (ref <5.7)
Mean Plasma Glucose: 131 (calc)
eAG (mmol/L): 7.3 (calc)

## 2019-08-30 LAB — TSH: TSH: 2.53 mIU/L (ref 0.40–4.50)

## 2019-08-30 LAB — LIPID PANEL
Cholesterol: 215 mg/dL — ABNORMAL HIGH (ref <200)
HDL: 33 mg/dL — ABNORMAL LOW (ref >=40)
LDL Cholesterol (Calc): 143 mg/dL (calc) — ABNORMAL HIGH
Non-HDL Cholesterol (Calc): 182 mg/dL (calc) — ABNORMAL HIGH (ref <130)
Total CHOL/HDL Ratio: 6.5 (calc) — ABNORMAL HIGH (ref <5.0)
Triglycerides: 239 mg/dL — ABNORMAL HIGH (ref <150)

## 2019-08-30 LAB — INSULIN, RANDOM: Insulin: 28.6 u[IU]/mL — ABNORMAL HIGH

## 2019-09-03 ENCOUNTER — Encounter: Payer: Medicare Other | Admitting: Physical Therapy

## 2019-09-04 ENCOUNTER — Ambulatory Visit: Payer: Self-pay

## 2019-09-04 ENCOUNTER — Ambulatory Visit
Admission: RE | Admit: 2019-09-04 | Discharge: 2019-09-04 | Disposition: A | Discharge status: Home / Self Care | Payer: Medicare Other | Source: Ambulatory Visit | Attending: Surgery | Admitting: Surgery

## 2019-09-04 ENCOUNTER — Encounter: Payer: Self-pay | Admitting: Surgery

## 2019-09-04 ENCOUNTER — Other Ambulatory Visit: Payer: Self-pay

## 2019-09-04 ENCOUNTER — Ambulatory Visit: Payer: Medicare Other | Admitting: Surgery

## 2019-09-04 DIAGNOSIS — M25551 Pain in right hip: Secondary | ICD-10-CM | POA: Diagnosis not present

## 2019-09-05 ENCOUNTER — Encounter: Payer: Medicare Other | Admitting: Physical Therapy

## 2019-09-05 ENCOUNTER — Telehealth: Payer: Self-pay | Admitting: Radiology

## 2019-09-05 ENCOUNTER — Encounter: Payer: Self-pay | Admitting: Surgery

## 2019-09-05 ENCOUNTER — Other Ambulatory Visit: Payer: Self-pay | Admitting: Radiology

## 2019-09-05 ENCOUNTER — Telehealth: Payer: Self-pay | Admitting: Surgery

## 2019-09-05 DIAGNOSIS — S32691A Other specified fracture of right ischium, initial encounter for closed fracture: Secondary | ICD-10-CM

## 2019-09-05 LAB — CUP PACEART REMOTE DEVICE CHECK
Date Time Interrogation Session: 20210505041614
Implantable Pulse Generator Implant Date: 20191021

## 2019-09-05 NOTE — Telephone Encounter (Signed)
Please schedule patient with Dr. Louanne Skye in 3 weeks for follow up per Jeneen Rinks. Thank you!

## 2019-09-05 NOTE — Telephone Encounter (Signed)
Today I had a phone conversation with patient's wife in regards to CT pelvis that was done yesterday.  Imaging facility did not contact me yesterday with callback report as they were instructed.  CT report showed:  EXAM: CT PELVIS WITHOUT CONTRAST  TECHNIQUE: Multidetector CT imaging of the pelvis was performed following the standard protocol without intravenous contrast.  COMPARISON:  Pelvis and right hip radiographs Sep 04, 2019. CT abdomen and pelvis May 17, 2017  FINDINGS: Urinary Tract: Urinary bladder is midline without appreciable wall thickening. No distal ureteral calculus or ureterectasis evident.  Bowel: No evident bowel wall thickening or bowel obstruction in the pelvic region. No free air noted in the lower abdomen or pelvic region.  Vascular/Lymphatic: Distal abdominal aorta measures 4.1 x 3.9 cm. Distal aorta measured 3.9 x 3.5 cm on prior study from 2019. There is prominence of each common iliac artery with measured diameter of the distal right common iliac artery of 3.0 x 2.9 cm, compared to measured value on previous study of 2.9 x 2.8 cm and measured diameter of the distal left common iliac artery of 3.4 x 3.3 cm corresponding to measured value on prior study of 2.9 x 3.2 cm. No periaortic fluid. There is aortic and iliac artery atherosclerosis. No adenopathy is demonstrable in the lower abdomen/pelvic regions.  Reproductive: Prostate and seminal vesicles normal in size and contour. No pelvic mass evident.  Other:  No abscess or ascites evident in the abdomen or pelvis.  Musculoskeletal: There is a fracture of the mid right ischium with mild impaction in this area. No other evident fracture. No dislocation. There is degenerative change in the lower lumbar spine region with vacuum phenomenon at L4-5 and L5-S1. No erosive changes. No intramuscular lesions are evident.  IMPRESSION: 1. Fracture mid right ischium with impaction at fracture site.  No other fracture evident. Degenerative change noted in lower lumbar spine.  2. Dilatation of the distal aorta measuring 4.1 x 3.9 cm. Dilatation of each common iliac artery with measured diameter in the right common iliac artery of 3.0 x 2.9 cm and 3.4 x 3.3 cm in the left common iliac artery. Recommend followup by ultrasound in 1 year. This recommendation follows ACR consensus guidelines: White Paper of the ACR Incidental Findings Committee II on Vascular Findings. J Am Coll Radiol 2013; 10:789-794. Aortic aneurysm NOS (ICD10-I71.9)  3. No bowel wall thickening or bowel obstruction in the pelvic region. No abnormal fluid or ascites. No abscess in the lower abdomen or pelvis.   Electronically Signed   By: Lowella Grip III M.D.   On: 09/04/2019 12:36  Patient states that she is the only person that helps her husband who needs a fair amount of assistance with daily activities including when he is up with his walker.  Patient has a history of chronic dizziness and advised patient's wife that he is at more risk for falls since he has consider amount of discomfort with weightbearing on the right leg.  We discussed options of home health PT versus skilled nursing facility placement.  Patient's wife states that she would rather he not go to a nursing home.  Advised patient that I would speak to my attending Dr. Louanne Skye about this situation.  Dr. Louanne Skye was okay with ordering home health PT and order was placed for [facility].  I advised patient's wife of this.  I will also see if we can get someone to help come out to the home to help with daily activities.  Recommend  patient follow-up with Dr. Louanne Skye in about 3 weeks for recheck.  Advised patient's wife that if it becomes too difficult to manage her husband with his full-time care that she should take him to the emergency room and see if he needs to be admitted to the hospital and then it can be arranged for him to have skilled placement at that  time.  She voices understanding.  She will call me with any questions or issues.

## 2019-09-05 NOTE — Progress Notes (Signed)
Office Visit Note   Patient: [name]           Date of Birth: [birth year]           MRN: [id number] Visit Date: 09/04/2019              Requested by: [name], [address] [place],  [place] [address] PCP: [name], MD   Assessment & Plan: Visit Diagnoses:  1. Pain in right hip     Plan: With patient's right hip pain and considerable amount of difficulty with weightbearing on the right lower extremity I recommend getting a CT pelvis to rule out fracture.  I will await callback report.  All questions answered.  Follow-Up Instructions: No follow-ups on file.   Orders:  Orders Placed This Encounter  Procedures  . XR HIP UNILAT W OR W/O PELVIS 2-3 VIEWS RIGHT  . CT PELVIS WO CONTRAST   No orders of the defined types were placed in this encounter.     Procedures: No procedures performed   Clinical Data: No additional findings.   Subjective: Chief Complaint  Patient presents with  . Right Hip - Pain    HPI 86 year old white male comes in with his wife today with complaints of right hip pain.  Patient states that he got up last Thursday and got dizzy and fell onto the right hip.  He was able to ambulate with some pain using his walker over the next couple of days.  Pain is progressively gotten worse and localizes most pain to the groin area and also the lateral posterior hip.  He has problems with chronic dizziness.   Review of Systems Patient admits to chronic unsteadiness of gait and feeling lightheaded when he is up and ambulating.    Objective: Vital Signs: There were no vitals taken for this visit.  Physical Exam HENT:     Head: Normocephalic and atraumatic.  Eyes:     Pupils: Pupils are equal, round, and reactive to light.  Pulmonary:     Effort: No respiratory distress.  Musculoskeletal:     Comments: Patient sitting in wheelchair.  Right hip minimal discomfort in the groin area with internal/external rotation.   Mildly tender over the lateral hip.  Left hip unremarkable.  Neurologically intact.  Neurological:     Mental Status: He is alert and oriented to person, place, and time.  Psychiatric:        Mood and Affect: Mood normal.     Ortho Exam  Specialty Comments:  No specialty comments available.   PMFS History: Patient Active Problem List   Diagnosis Date Noted  . At high risk for falls 05/29/2019  . Chronic bilateral low back pain without sciatica 02/07/2019  . Statin intolerance 11/16/2018  . Hyperlipidemia, mixed 08/15/2018  . Low back pain 07/18/2018  . History of lacunar cerebrovascular accident (CVA) 02/12/2018  . Dizziness 02/12/2018  . Myofascial pain 12/18/2017  . Spondylosis without myelopathy or radiculopathy, lumbar region 12/18/2017  . Lumbar radiculopathy, right 12/18/2017  . Cholelithiasis 05/17/2017  . Sinus Bradycardia 05/17/2017  . Thoracic radiculopathy 04/12/2017  . Primary osteoarthritis of right knee 12/14/2016  . DDD (degenerative disc disease), lumbar 10/10/2016  . Lumbar facet arthropathy 08/29/2016  . Idiopathic scoliosis 03/22/2016  . Diabetic neuropathy ([place]) 03/22/2016  . Dysautonomia orthostatic hypotension syndrome ([place]) 02/25/2016  . CKD stage 3 due to type 2 diabetes mellitus ([place]) 02/19/2016  . Coronary artery disease involving coronary bypass graft of  native heart without angina pectoris   . Diabetes mellitus type 2, diet-controlled ([place])   . 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 Medication management 08/11/2015  . 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 PVD (peripheral vascular disease) ([place]) 01/01/2014  . Hyperlipidemia associated with type 2 diabetes mellitus ([place]) 04/11/2013  . Supine hypertension   . Vitamin D deficiency   . Vitamin B 12 deficiency   . Coronary atherosclerosis- s/p PCI to LAD in 2009 and PCI to RCA in 2011 02/27/2009  . Abdominal aortic aneurysm ([place]) 02/27/2009  . GERD 02/27/2009   Past Medical History:  Diagnosis Date  .  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 Colon polyps   . Coronary atherosclerosis of unspecified type of vessel, native or graft   . Diabetes ([place])   . Difficult intubation   . 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 Esophageal reflux   . High cholesterol   . History of IBS 02/27/2009  . Hypertension    pt denies, he says he has a h/o hypotension. If BP up he adjusts the Florinef  . Jejunitis with partial SBO 05/20/2017  . Orthostatic hypotension    "BP has been dropping alot when I stand up for the last month or so" (02/17/2016)  . Prostatitis   . Upper GI bleed 06/24/2018  . Vitamin B 12 deficiency   . Vitamin D deficiency     Family History  Problem Relation Age of Onset  . Heart attack Father        died age 39  . Heart attack Brother        died age 79  . Anuerysm Brother        aortic  . Heart attack Sister        died age 16  . Colon cancer Sister   . Liver cancer Sister   . Diabetes Maternal Grandmother   . Arthritis Mother   . Dementia Mother   . Heart Problems Brother   . Heart Problems Brother   . Heart Problems Brother   . Heart Problems Brother   . Healthy Daughter   . Healthy Son   . Esophageal cancer Neg Hx   . Inflammatory bowel disease Neg Hx   . Pancreatic cancer Neg Hx   . Stomach cancer Neg Hx     Past Surgical History:  Procedure Laterality Date  . BIOPSY  06/25/2018   Procedure: BIOPSY;  Surgeon: Rush Landmark Telford Nab., MD;  Location: [facility];  Service: Gastroenterology;;  . BIOPSY  04/24/2019   Procedure: BIOPSY;  Surgeon: Irving Copas., MD;  Location: [facility];  Service: Gastroenterology;;  . BLADDER SURGERY  1969   traumatic pelvic fractures, urethral and bladder repair  . CARDIAC CATHETERIZATION N/A 07/01/2015   Procedure: Left Heart Cath and Coronary Angiography;  Surgeon: Wellington Hampshire, MD;  Location: [facility];  Service: Cardiovascular;  Laterality: N/A;  . COLON RESECTION N/A 05/17/2017   Procedure: DIAGNOSTIC  LAPAROSCOPY,;  Surgeon: Leighton Ruff, MD;  [facility]: [facility];  Service: General;  Laterality: N/A;  . COLONOSCOPY WITH PROPOFOL N/A 04/24/2019   Procedure: COLONOSCOPY WITH PROPOFOL;  Surgeon: Irving Copas., MD;  [facility];  Service: Gastroenterology;  Laterality: N/A;  . CORONARY ANGIOPLASTY WITH STENT PLACEMENT    . CORONARY ARTERY BYPASS GRAFT N/A 07/06/2015   Procedure: CORONARY ARTERY BYPASS GRAFTING (CABG)x 4   utilizing the left internal mammary artery and endoscopically harvested bilateral  sapheneous vein.;  Surgeon: Collier Salina  Prescott Gum, MD;  Location: [facility];  Service: Open Heart Surgery;  Laterality: N/A;  . ESOPHAGEAL DILATION  04/24/2019   Procedure: ESOPHAGEAL DILATION;  Surgeon: Rush Landmark Telford Nab., MD;  [facility];  Service: Gastroenterology;;  . ESOPHAGOGASTRODUODENOSCOPY (EGD) WITH PROPOFOL N/A 06/25/2018   Procedure: ESOPHAGOGASTRODUODENOSCOPY (EGD) WITH PROPOFOL;  Surgeon: Irving Copas., MD;  Location: [facility];  Service: Gastroenterology;  Laterality: N/A;  . ESOPHAGOGASTRODUODENOSCOPY (EGD) WITH PROPOFOL N/A 04/24/2019   Procedure: ESOPHAGOGASTRODUODENOSCOPY (EGD) WITH PROPOFOL;  Surgeon: Rush Landmark Telford Nab., MD;  [facility];  Service: Gastroenterology;  Laterality: N/A;  . HEMOSTASIS CLIP PLACEMENT  04/24/2019   Procedure: HEMOSTASIS CLIP PLACEMENT;  Surgeon: Irving Copas., MD;  [facility];  Service: Gastroenterology;;  . KNEE SURGERY    . LOOP RECORDER INSERTION N/A 02/19/2018   Procedure: LOOP RECORDER INSERTION;  Surgeon: Deboraha Sprang, MD;  Location: [facility];  Service: Cardiovascular;  Laterality: N/A;  . POLYPECTOMY  04/24/2019   Procedure: POLYPECTOMY;  Surgeon: Rush Landmark Telford Nab., MD;  Location: [facility];  Service: Gastroenterology;;  . Lia Foyer LIFTING INJECTION  04/24/2019   Procedure: SUBMUCOSAL LIFTING INJECTION;  Surgeon: Irving Copas., MD;   [facility];  Service: Gastroenterology;;  . TEE WITHOUT CARDIOVERSION N/A 07/06/2015   Procedure: TRANSESOPHAGEAL ECHOCARDIOGRAM (TEE);  Surgeon: Ivin Poot, MD;  Location: [facility];  Service: Open Heart Surgery;  Laterality: N/A;   Social History   Occupational History  . Occupation: Retired  Tobacco Use  . Smoking status: Former Smoker    Quit date: 07/16/1963    Years since quitting: 56.1  . Smokeless tobacco: Former Systems developer    Types: Chew    Quit date: 1989  Substance and Sexual Activity  . Alcohol use: No  . Drug use: No  . Sexual activity: Not Currently

## 2019-09-05 NOTE — Therapy (Addendum)
[facility] [address] [place], [place], [address] Phone: [phone]   Fax:  [phone]  Physical Therapy note/Discharge addendum PHYSICAL THERAPY DISCHARGE SUMMARY  Visits from Start of Care: 3 Current functional level related to goals / functional outcomes: Setback due to fall and hip fx   Remaining deficits: Unable to weight bear due to hip fx   Education / Equipment: Hold PT to seek MD care and recommendations Plan: Patient agrees to discharge.  Patient goals were not met.                                                  a change in medical status.  ?????  Elsie Ra, PT, DPT 02/17/20 8:43 AM      Patient Details  Name: [name] MRN: [id number] Date of Birth: 1933-12-18 Referring Provider (PT): [name], MD   Encounter Date: 08/30/2019    Past Medical History:  Diagnosis Date  . 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 Colon polyps   . Coronary atherosclerosis of unspecified type of vessel, native or graft   . Diabetes ([place])   . Difficult intubation   . 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 Esophageal reflux   . High cholesterol   . History of IBS 02/27/2009  . Hypertension    pt denies, he says he has a h/o hypotension. If BP up he adjusts the Florinef  . Jejunitis with partial SBO 05/20/2017  . Orthostatic hypotension    "BP has been dropping alot when I stand up for the last month or so" (02/17/2016)  . Prostatitis   . Upper GI bleed 06/24/2018  . Vitamin B 12 deficiency   . Vitamin D deficiency     Past Surgical History:  Procedure Laterality Date  . BIOPSY  06/25/2018   Procedure: BIOPSY;  Surgeon: Rush Landmark Telford Nab., MD;  Location: [facility];  Service: Gastroenterology;;  . BIOPSY  04/24/2019   Procedure: BIOPSY;  Surgeon: Irving Copas., MD;  Location: [facility];  Service: Gastroenterology;;  . BLADDER SURGERY  1969   traumatic pelvic fractures, urethral and bladder repair  .  CARDIAC CATHETERIZATION N/A 07/01/2015   Procedure: Left Heart Cath and Coronary Angiography;  Surgeon: Wellington Hampshire, MD;  Location: [facility];  Service: Cardiovascular;  Laterality: N/A;  . COLON RESECTION N/A 05/17/2017   Procedure: DIAGNOSTIC LAPAROSCOPY,;  Surgeon: Leighton Ruff, MD;  [facility];  Service: General;  Laterality: N/A;  . COLONOSCOPY WITH PROPOFOL N/A 04/24/2019   Procedure: COLONOSCOPY WITH PROPOFOL;  Surgeon: Irving Copas., MD;  [facility];  Service: Gastroenterology;  Laterality: N/A;  . CORONARY ANGIOPLASTY WITH STENT PLACEMENT    . CORONARY ARTERY BYPASS GRAFT N/A 07/06/2015   Procedure: CORONARY ARTERY BYPASS GRAFTING (CABG)x 4   utilizing the left internal mammary artery and endoscopically harvested bilateral  sapheneous vein.;  Surgeon: Ivin Poot, MD;  Location: [facility];  Service: Open Heart Surgery;  Laterality: N/A;  . ESOPHAGEAL DILATION  04/24/2019   Procedure: ESOPHAGEAL DILATION;  Surgeon: Rush Landmark Telford Nab., MD;  [facility];  Service: Gastroenterology;;  . ESOPHAGOGASTRODUODENOSCOPY (EGD) WITH PROPOFOL N/A 06/25/2018   Procedure: ESOPHAGOGASTRODUODENOSCOPY (EGD) WITH PROPOFOL;  Surgeon: Irving Copas., MD;  Location: [facility];  Service: Gastroenterology;  Laterality: N/A;  . ESOPHAGOGASTRODUODENOSCOPY (EGD) WITH PROPOFOL  N/A 04/24/2019   Procedure: ESOPHAGOGASTRODUODENOSCOPY (EGD) WITH PROPOFOL;  Surgeon: Rush Landmark Telford Nab., MD;  Location: [facility];  Service: Gastroenterology;  Laterality: N/A;  . HEMOSTASIS CLIP PLACEMENT  04/24/2019   Procedure: HEMOSTASIS CLIP PLACEMENT;  Surgeon: Irving Copas., MD;  [facility];  Service: Gastroenterology;;  . KNEE SURGERY    . LOOP RECORDER INSERTION N/A 02/19/2018   Procedure: LOOP RECORDER INSERTION;  Surgeon: Deboraha Sprang, MD;  Location: [facility];  Service: Cardiovascular;  Laterality: N/A;  . POLYPECTOMY   04/24/2019   Procedure: POLYPECTOMY;  Surgeon: Rush Landmark Telford Nab., MD;  Location: [facility];  Service: Gastroenterology;;  . Lia Foyer LIFTING INJECTION  04/24/2019   Procedure: SUBMUCOSAL LIFTING INJECTION;  Surgeon: Irving Copas., MD;  [facility];  Service: Gastroenterology;;  . TEE WITHOUT CARDIOVERSION N/A 07/06/2015   Procedure: TRANSESOPHAGEAL ECHOCARDIOGRAM (TEE);  Surgeon: Ivin Poot, MD;  Location: [facility];  Service: Open Heart Surgery;  Laterality: N/A;    There were no vitals filed for this visit.      He arrived to PT stating he was in a lot of pain from fall landing on his Rt hip making weight bearing difficult. PT recommending cancelling session and holding PT until he gets XR on his hip. PT will follow up after imaging. There was no charge for this visit.   Elsie Ra, PT, DPT 09/05/19 10:43 AM                           PT Long Term Goals - 08/28/19 1226      PT LONG TERM GOAL #1   Title  independent with updated HEP ([facility] all LTG 8 weeks  10/10/2019)    Time  8    Period  Weeks    Status  New      PT LONG TERM GOAL #2   Title  improve 5TSTS test to less than 21 seconds to show improved leg strength/endurance    Baseline  31    Time  8    Period  Weeks    Status  New      PT LONG TERM GOAL #3   Title  Improve bilat leg strength to at least 4+/5 MMT measured in sitting to improve function    Baseline  3-4 for Lt leg    Time  8    Period  Weeks    Status  New      PT LONG TERM GOAL #4   Title  Will improve 5TSTS to <25 sec to show improved leg strength and endurance. Revised on 06/19/19 to <20 seconds    Baseline  31    Time  8    Period  Weeks    Status  Achieved      PT LONG TERM GOAL #5   Title  Ambulate with LRAD mod I  for 625 ft or stand at least 10 min    Time  8    Period  Weeks    Status  New              Patient will benefit from skilled therapeutic intervention in order to  improve the following deficits and impairments:     Visit Diagnosis: Other abnormalities of gait and mobility  Muscle weakness (generalized)  Chronic bilateral low back pain with right-sided sciatica  Other symptoms and signs involving the nervous system  Dizziness and giddiness  Problem List Patient Active Problem List   Diagnosis Date Noted  . At high risk for falls 05/29/2019  . Chronic bilateral low back pain without sciatica 02/07/2019  . Statin intolerance 11/16/2018  . Hyperlipidemia, mixed 08/15/2018  . Low back pain 07/18/2018  . History of lacunar cerebrovascular accident (CVA) 02/12/2018  . Dizziness 02/12/2018  . Myofascial pain 12/18/2017  . Spondylosis without myelopathy or radiculopathy, lumbar region 12/18/2017  . Lumbar radiculopathy, right 12/18/2017  . Cholelithiasis 05/17/2017  . Sinus Bradycardia 05/17/2017  . Thoracic radiculopathy 04/12/2017  . Primary osteoarthritis of right knee 12/14/2016  . DDD (degenerative disc disease), lumbar 10/10/2016  . Lumbar facet arthropathy 08/29/2016  . Idiopathic scoliosis 03/22/2016  . Diabetic neuropathy ([place]) 03/22/2016  . Dysautonomia orthostatic hypotension syndrome ([place]) 02/25/2016  . CKD stage 3 due to type 2 diabetes mellitus ([place]) 02/19/2016  . Coronary artery disease involving coronary bypass graft of native heart without angina pectoris   . Diabetes mellitus type 2, diet-controlled ([place])   . 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 Medication management 08/11/2015  . 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 PVD (peripheral vascular disease) ([place]) 01/01/2014  . Hyperlipidemia associated with type 2 diabetes mellitus ([place]) 04/11/2013  . Supine hypertension   . Vitamin D deficiency   . Vitamin B 12 deficiency   . Coronary atherosclerosis- s/p PCI to LAD in 2009 and PCI to RCA in 2011 02/27/2009  . Abdominal aortic aneurysm ([place]) 02/27/2009  . GERD 02/27/2009    [name] 09/05/2019, 10:42 AM  [facility] [address] [place], [place], [address] Phone: [phone]   Fax:  [phone]  Name: [name] MRN: [id number] Date of Birth: October 13, 1933

## 2019-09-09 ENCOUNTER — Ambulatory Visit (INDEPENDENT_AMBULATORY_CARE_PROVIDER_SITE_OTHER): Payer: Medicare Other | Admitting: *Deleted

## 2019-09-09 DIAGNOSIS — I639 Cerebral infarction, unspecified: Secondary | ICD-10-CM

## 2019-09-10 ENCOUNTER — Encounter: Payer: Medicare Other | Admitting: Physical Therapy

## 2019-09-10 NOTE — Progress Notes (Signed)
Carelink Summary Report / Loop Recorder 

## 2019-09-11 ENCOUNTER — Other Ambulatory Visit: Payer: Self-pay

## 2019-09-11 ENCOUNTER — Emergency Department (HOSPITAL_COMMUNITY): Payer: Medicare Other

## 2019-09-11 ENCOUNTER — Encounter: Payer: Self-pay | Admitting: Surgery

## 2019-09-11 ENCOUNTER — Emergency Department (HOSPITAL_COMMUNITY)
Admission: EM | Admit: 2019-09-11 | Discharge: 2019-09-11 | Disposition: A | Discharge status: Home / Self Care | Payer: Medicare Other | Attending: Emergency Medicine | Admitting: Emergency Medicine

## 2019-09-11 ENCOUNTER — Ambulatory Visit: Payer: Self-pay

## 2019-09-11 ENCOUNTER — Ambulatory Visit: Payer: Medicare Other | Admitting: Surgery

## 2019-09-11 ENCOUNTER — Encounter (HOSPITAL_COMMUNITY): Payer: Self-pay | Admitting: Emergency Medicine

## 2019-09-11 DIAGNOSIS — R296 Repeated falls: Secondary | ICD-10-CM | POA: Diagnosis not present

## 2019-09-11 DIAGNOSIS — Z7982 Long term (current) use of aspirin: Secondary | ICD-10-CM | POA: Insufficient documentation

## 2019-09-11 DIAGNOSIS — Z87891 Personal history of nicotine dependence: Secondary | ICD-10-CM | POA: Diagnosis not present

## 2019-09-11 DIAGNOSIS — F0390 Unspecified dementia without behavioral disturbance: Secondary | ICD-10-CM | POA: Diagnosis not present

## 2019-09-11 DIAGNOSIS — I69393 Ataxia following cerebral infarction: Secondary | ICD-10-CM

## 2019-09-11 DIAGNOSIS — M25551 Pain in right hip: Secondary | ICD-10-CM

## 2019-09-11 DIAGNOSIS — R42 Dizziness and giddiness: Secondary | ICD-10-CM | POA: Diagnosis not present

## 2019-09-11 DIAGNOSIS — N183 Chronic kidney disease, stage 3 unspecified: Secondary | ICD-10-CM | POA: Insufficient documentation

## 2019-09-11 DIAGNOSIS — S329XXS Fracture of unspecified parts of lumbosacral spine and pelvis, sequela: Secondary | ICD-10-CM | POA: Insufficient documentation

## 2019-09-11 DIAGNOSIS — E1140 Type 2 diabetes mellitus with diabetic neuropathy, unspecified: Secondary | ICD-10-CM | POA: Insufficient documentation

## 2019-09-11 DIAGNOSIS — Z79899 Other long term (current) drug therapy: Secondary | ICD-10-CM | POA: Diagnosis not present

## 2019-09-11 DIAGNOSIS — I129 Hypertensive chronic kidney disease with stage 1 through stage 4 chronic kidney disease, or unspecified chronic kidney disease: Secondary | ICD-10-CM | POA: Diagnosis not present

## 2019-09-11 DIAGNOSIS — W19XXXA Unspecified fall, initial encounter: Secondary | ICD-10-CM | POA: Diagnosis not present

## 2019-09-11 DIAGNOSIS — E119 Type 2 diabetes mellitus without complications: Secondary | ICD-10-CM | POA: Diagnosis not present

## 2019-09-11 DIAGNOSIS — I2510 Atherosclerotic heart disease of native coronary artery without angina pectoris: Secondary | ICD-10-CM | POA: Insufficient documentation

## 2019-09-11 LAB — URINALYSIS, ROUTINE W REFLEX MICROSCOPIC
Bacteria, UA: NONE SEEN
Bilirubin Urine: NEGATIVE
Glucose, UA: NEGATIVE mg/dL
Hgb urine dipstick: NEGATIVE
Ketones, ur: NEGATIVE mg/dL
Leukocytes,Ua: NEGATIVE
Nitrite: NEGATIVE
Protein, ur: NEGATIVE mg/dL
Specific Gravity, Urine: 1.02 (ref 1.005–1.030)
pH: 5 (ref 5.0–8.0)

## 2019-09-11 LAB — HEPATIC FUNCTION PANEL
ALT: 10 U/L (ref 0–44)
AST: 18 U/L (ref 15–41)
Albumin: 3.6 g/dL (ref 3.5–5.0)
Alkaline Phosphatase: 142 U/L — ABNORMAL HIGH (ref 38–126)
Bilirubin, Direct: 0.1 mg/dL (ref 0.0–0.2)
Indirect Bilirubin: 0.3 mg/dL (ref 0.3–0.9)
Total Bilirubin: 0.4 mg/dL (ref 0.3–1.2)
Total Protein: 6.9 g/dL (ref 6.5–8.1)

## 2019-09-11 LAB — CBC
HCT: 42.5 % (ref 39.0–52.0)
Hemoglobin: 13.3 g/dL (ref 13.0–17.0)
MCH: 31.7 pg (ref 26.0–34.0)
MCHC: 31.3 g/dL (ref 30.0–36.0)
MCV: 101.4 fL — ABNORMAL HIGH (ref 80.0–100.0)
Platelets: 275 10*3/uL (ref 150–400)
RBC: 4.19 MIL/uL — ABNORMAL LOW (ref 4.22–5.81)
RDW: 13.6 % (ref 11.5–15.5)
WBC: 12.2 10*3/uL — ABNORMAL HIGH (ref 4.0–10.5)
nRBC: 0 % (ref 0.0–0.2)

## 2019-09-11 LAB — BASIC METABOLIC PANEL
Anion gap: 11 (ref 5–15)
BUN: 34 mg/dL — ABNORMAL HIGH (ref 8–23)
CO2: 27 mmol/L (ref 22–32)
Calcium: 9.7 mg/dL (ref 8.9–10.3)
Chloride: 103 mmol/L (ref 98–111)
Creatinine, Ser: 2.15 mg/dL — ABNORMAL HIGH (ref 0.61–1.24)
GFR calc Af Amer: 32 mL/min — ABNORMAL LOW (ref >60)
GFR calc non Af Amer: 27 mL/min — ABNORMAL LOW (ref >60)
Glucose, Bld: 100 mg/dL — ABNORMAL HIGH (ref 70–99)
Potassium: 5.2 mmol/L — ABNORMAL HIGH (ref 3.5–5.1)
Sodium: 141 mmol/L (ref 135–145)

## 2019-09-11 MED ORDER — PANTOPRAZOLE SODIUM 40 MG PO TBEC: 40.0000 mg | DELAYED_RELEASE_TABLET | Freq: Every day | ORAL | Status: DC

## 2019-09-11 MED ORDER — ASPIRIN EC 81 MG PO TBEC: 81.0000 mg | DELAYED_RELEASE_TABLET | Freq: Every day | ORAL | Status: DC

## 2019-09-11 MED ORDER — SODIUM CHLORIDE 0.9% FLUSH: 3.0000 mL | Freq: Once | INTRAVENOUS | Status: DC

## 2019-09-11 MED ORDER — AMLODIPINE BESYLATE 5 MG PO TABS: 10.0000 mg | ORAL_TABLET | Freq: Every day | ORAL | Status: DC

## 2019-09-11 MED ORDER — ACETAMINOPHEN 500 MG PO TABS: 1000.0000 mg | ORAL_TABLET | Freq: Two times a day (BID) | ORAL | Status: DC | PRN

## 2019-09-11 NOTE — ED Triage Notes (Signed)
Pt states he is here for a R hip fracture.  Unsure when or why he fell but states the Dr told him to come to ED.    Per chart, pt with frequent falls.  Episode of dizziness and fall over the weekend.  Dr. Ricard Dillon note states no acute changes on x-rays today compared to previous films.  Pt had previous pelvic fracture from 09/04/19 fall.    Sent to ED for further eval of increasing dizziness and recommendation for SNF.

## 2019-09-11 NOTE — ED Notes (Signed)
Pt transported to MRI 

## 2019-09-11 NOTE — Discharge Instructions (Addendum)
1.  You have old small areas of stroke in the cerebellum.  Your falls and imbalance may be due to this condition.  Continue your daily aspirin.  Discussed bleeding risk with your doctor to determine if you would be a candidate to increase your aspirin dose or add an additional medication. 2.  Continue to work with your doctor for increased assistance at home as needed during your recovery phase from your pelvic fracture. 3.  Return to the emergency department if you have any worsening symptoms or new concerning symptoms.

## 2019-09-11 NOTE — ED Provider Notes (Signed)
[facility] EMERGENCY DEPARTMENT Provider Note   CSN: [id number] Arrival date & time: 09/11/19  1023     History Chief Complaint  Patient presents with  . Dizziness  . Fall    [name] is a 86 y.o. male.  Patient sent in from Dr. Ricard [facility] orthopedics along with his wife.  Patient has a history of dementia.  He has had several falls.  Back in the beginning of May he was identified to have a pelvic fracture.  Patient walks on it so there is no pain problems.  To complaining of right hip pain more had x-rays this morning done by orthopedics which showed no obvious bony injuries.  Patient's wife is hoping for nursing home placement.  Based on this we will plan to go ahead and CT the right hip just to see if there is a hairline fracture.  Will get CT head and if negative will plan to do MRI.  Patient denies any concerns.  But his wife says that he has had multiple falls and there seems to be that his balance is off.        Past Medical History:  Diagnosis Date  . 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 Colon polyps   . Coronary atherosclerosis of unspecified type of vessel, native or graft   . Diabetes ([place])   . Difficult intubation   . 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 Esophageal reflux   . High cholesterol   . History of IBS 02/27/2009  . Hypertension    pt denies, he says he has a h/o hypotension. If BP up he adjusts the [place]  . Jejunitis with partial SBO 05/20/2017  . Orthostatic hypotension    "BP has been dropping alot when I stand up for the last month or so" (02/17/2016)  . Prostatitis   . Upper GI bleed 06/24/2018  . Vitamin B 12 deficiency   . Vitamin D deficiency     Patient Active Problem List   Diagnosis Date Noted  . At high risk for falls 05/29/2019  . Chronic bilateral low back pain without sciatica 02/07/2019  . Statin intolerance 11/16/2018  . Hyperlipidemia, mixed 08/15/2018  . Low back pain 07/18/2018  . History of lacunar  cerebrovascular accident (CVA) 02/12/2018  . Dizziness 02/12/2018  . Myofascial pain 12/18/2017  . Spondylosis without myelopathy or radiculopathy, lumbar region 12/18/2017  . Lumbar radiculopathy, right 12/18/2017  . Cholelithiasis 05/17/2017  . Sinus Bradycardia 05/17/2017  . Thoracic radiculopathy 04/12/2017  . Primary osteoarthritis of right knee 12/14/2016  . DDD (degenerative disc disease), lumbar 10/10/2016  . Lumbar facet arthropathy 08/29/2016  . Idiopathic scoliosis 03/22/2016  . Diabetic neuropathy ([place]) 03/22/2016  . Dysautonomia orthostatic hypotension syndrome ([place]) 02/25/2016  . CKD stage 3 due to type 2 diabetes mellitus ([place]) 02/19/2016  . Coronary artery disease involving coronary bypass graft of native heart without angina pectoris   . Diabetes mellitus type 2, diet-controlled ([place])   . 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 Medication management 08/11/2015  . 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 PVD (peripheral vascular disease) ([place]) 01/01/2014  . Hyperlipidemia associated with type 2 diabetes mellitus ([place]) 04/11/2013  . Supine hypertension   . Vitamin D deficiency   . Vitamin B 12 deficiency   . Coronary atherosclerosis- s/p PCI to LAD in 2009 and PCI to RCA in 2011 02/27/2009  . Abdominal aortic aneurysm ([place]) 02/27/2009  . GERD 02/27/2009    Past Surgical History:  Procedure Laterality Date  . BIOPSY  06/25/2018   Procedure: BIOPSY;  Surgeon: Rush Landmark Telford Nab., MD;  Location: [facility];  Service: Gastroenterology;;  . BIOPSY  04/24/2019   Procedure: BIOPSY;  Surgeon: Irving Copas., MD;  Location: [facility];  Service: Gastroenterology;;  . BLADDER SURGERY  1969   traumatic pelvic fractures, urethral and bladder repair  . CARDIAC CATHETERIZATION N/A 07/01/2015   Procedure: Left Heart Cath and Coronary Angiography;  Surgeon: Wellington Hampshire, MD;  Location: [facility];  Service: Cardiovascular;  Laterality: N/A;  . COLON RESECTION N/A  05/17/2017   Procedure: DIAGNOSTIC LAPAROSCOPY,;  Surgeon: Leighton Ruff, MD;  Location: [facility];  Service: General;  Laterality: N/A;  . COLONOSCOPY WITH PROPOFOL N/A 04/24/2019   Procedure: COLONOSCOPY WITH PROPOFOL;  Surgeon: Irving Copas., MD;  Location: [facility];  Service: Gastroenterology;  Laterality: N/A;  . CORONARY ANGIOPLASTY WITH STENT PLACEMENT    . CORONARY ARTERY BYPASS GRAFT N/A 07/06/2015   Procedure: CORONARY ARTERY BYPASS GRAFTING (CABG)x 4   utilizing the left internal mammary artery and endoscopically harvested bilateral  sapheneous vein.;  Surgeon: Ivin Poot, MD;  Location: [facility];  Service: Open Heart Surgery;  Laterality: N/A;  . ESOPHAGEAL DILATION  04/24/2019   Procedure: ESOPHAGEAL DILATION;  Surgeon: Rush Landmark Telford Nab., MD;  Location: [facility];  Service: Gastroenterology;;  . ESOPHAGOGASTRODUODENOSCOPY (EGD) WITH PROPOFOL N/A 06/25/2018   Procedure: ESOPHAGOGASTRODUODENOSCOPY (EGD) WITH PROPOFOL;  Surgeon: Irving Copas., MD;  Location: [facility];  Service: Gastroenterology;  Laterality: N/A;  . ESOPHAGOGASTRODUODENOSCOPY (EGD) WITH PROPOFOL N/A 04/24/2019   Procedure: ESOPHAGOGASTRODUODENOSCOPY (EGD) WITH PROPOFOL;  Surgeon: Rush Landmark Telford Nab., MD;  Location: [facility];  Service: Gastroenterology;  Laterality: N/A;  . HEMOSTASIS CLIP PLACEMENT  04/24/2019   Procedure: HEMOSTASIS CLIP PLACEMENT;  Surgeon: Irving Copas., MD;  Location: [facility];  Service: Gastroenterology;;  . KNEE SURGERY    . LOOP RECORDER INSERTION N/A 02/19/2018   Procedure: LOOP RECORDER INSERTION;  Surgeon: Deboraha Sprang, MD;  Location: [facility];  Service: Cardiovascular;  Laterality: N/A;  . POLYPECTOMY  04/24/2019   Procedure: POLYPECTOMY;  Surgeon: Rush Landmark Telford Nab., MD;  Location: [facility];  Service: Gastroenterology;;  . Lia Foyer LIFTING INJECTION  04/24/2019   Procedure: SUBMUCOSAL LIFTING INJECTION;  Surgeon:  Irving Copas., MD;  Location: [facility];  Service: Gastroenterology;;  . TEE WITHOUT CARDIOVERSION N/A 07/06/2015   Procedure: TRANSESOPHAGEAL ECHOCARDIOGRAM (TEE);  Surgeon: Ivin Poot, MD;  Location: [facility];  Service: Open Heart Surgery;  Laterality: N/A;       Family History  Problem Relation Age of Onset  . Heart attack Father        died age 37  . Heart attack Brother        died age 73  . Anuerysm Brother        aortic  . Heart attack Sister        died age 56  . Colon cancer Sister   . Liver cancer Sister   . Diabetes Maternal Grandmother   . Arthritis Mother   . Dementia Mother   . Heart Problems Brother   . Heart Problems Brother   . Heart Problems Brother   . Heart Problems Brother   . Healthy Daughter   . Healthy Son   . Esophageal cancer Neg Hx   . Inflammatory bowel disease Neg Hx   . Pancreatic cancer Neg Hx   . Stomach cancer Neg Hx  Social History   Tobacco Use  . Smoking status: Former Smoker    Quit date: 07/16/1963    Years since quitting: 56.1  . Smokeless tobacco: Former Systems developer    Types: Chew    Quit date: 1989  Substance Use Topics  . Alcohol use: No  . Drug use: No    Home Medications Prior to Admission medications   Medication Sig Start Date End Date Taking? Authorizing Provider  acetaminophen (TYLENOL) 500 MG tablet Take 1,000 mg by mouth 2 (two) times daily as needed for headache (pain).     [provider]  amitriptyline (ELAVIL) 10 MG tablet TAKE 1 TABLET BY MOUTH EVERYDAY AT BEDTIME 08/29/19   [name], MD  amLODipine (NORVASC) 10 MG tablet Take 1/2 to 1 tablet daily for BP Patient taking differently: Take 10 mg by mouth daily.  07/20/19   [name], MD  Ascorbic Acid (VITAMIN C) 500 MG CAPS Take 500 mg by mouth daily.     [provider]  aspirin EC 81 MG tablet Take 1 tablet (81 mg total) by mouth daily. 07/19/18   Croitoru, Mihai, MD  butalbital-acetaminophen-caffeine (FIORICET)  50-325-40 MG tablet TAKE 1 TABLET EVERY 4 HOURS ONLY IF SEVERE HEADACHE 08/15/19   [name], MD  Calcium Carb-Cholecalciferol (CALCIUM+D3 PO) Take 1 tablet by mouth daily.    [provider]  chlorzoxazone (PARAFON FORTE DSC) 500 MG tablet Take 1 tablet (500 mg total) by mouth every 6 (six) hours as needed for muscle spasms. 08/07/19   [name], MD  Cholecalciferol (VITAMIN D-3) 5000 units TABS Take 5,000 Units by mouth daily.    [provider]  Ferrous Sulfate (IRON SLOW RELEASE) 140 (45 Fe) MG TBCR Take 1 tablet by mouth daily.    [provider]  fludrocortisone (FLORINEF) 0.1 MG tablet Take 2 tablets Daily for Low BP 08/20/19   [name], MD  glucose blood test strip Check blood sugar 1 time daily-[id number] 07/07/17   [name], MD  Magnesium 500 MG TABS Take 500 mg by mouth daily.     [provider]  montelukast (SINGULAIR) 10 MG tablet Take 1 tablet  Daily for Allergies & Asthma 07/22/19   [name], MD  Multiple Vitamin (MULTIVITAMIN WITH MINERALS) TABS tablet Take 1 tablet by mouth daily.    [provider]  Omega-3 Fatty Acids (FISH OIL OMEGA-3 PO) Take 1 capsule by mouth daily.    [provider]  pantoprazole (PROTONIX) 40 MG tablet Take 1 tablet Daily for Indigestion & Heartburn Patient taking differently: Take 40 mg by mouth daily.  03/11/19   [name], MD  polyethylene glycol [facility] / Floria Raveling) packet Take 17 g by mouth daily. 05/22/17   Barton Dubois, MD  pregabalin (LYRICA) 100 MG capsule Take 1 capsule     3 x /day for Nerve Pains 08/29/19   [name], MD  sulfamethoxazole-trimethoprim (BACTRIM DS) 800-160 MG tablet Take 1 tablet by mouth 2 (two) times daily. 08/19/19   Liane Comber, NP  Zinc 50 MG TABS Take 1 tablet by mouth daily.    [provider]    Allergies    Cymbalta [duloxetine hcl], Keflex [cephalexin], Simvastatin, and Sudafed [pseudoephedrine]  Review of  Systems   Review of Systems  Unable to perform ROS: Dementia    Physical Exam Updated Vital Signs BP 111/68 (BP Location: Left Arm)   Pulse (!) 57   Temp 97.6 F (36.4 C) (Oral)   Resp  16   Ht 1.829 m (6')   Wt 81.6 kg   SpO2 98%   BMI 24.41 kg/m   Physical Exam Vitals and nursing note reviewed.  Constitutional:      Appearance: Normal appearance. He is well-developed.  HENT:     Head: Normocephalic and atraumatic.  Eyes:     Extraocular Movements: Extraocular movements intact.     Conjunctiva/sclera: Conjunctivae normal.     Pupils: Pupils are equal, round, and reactive to light.  Cardiovascular:     Rate and Rhythm: Normal rate and regular rhythm.     Heart sounds: No murmur.  Pulmonary:     Effort: Pulmonary effort is normal. No respiratory distress.     Breath sounds: Normal breath sounds.  Abdominal:     Palpations: Abdomen is soft.     Tenderness: There is no abdominal tenderness.  Musculoskeletal:        General: No swelling. Normal range of motion.     Cervical back: Normal range of motion and neck supple.  Skin:    General: Skin is warm and dry.  Neurological:     General: No focal deficit present.     Mental Status: He is alert.     Comments: Patient has a degree of confusion but no obvious focal deficit.  Will raise both lower extremities some pain associated with raising the right lower extremity.  Upper extremities fine no drift.  No obvious cranial nerve deficit.     ED Results / Procedures / Treatments   Labs (all labs ordered are listed, but only abnormal results are displayed) Labs Reviewed  BASIC METABOLIC PANEL  CBC  URINALYSIS, ROUTINE W REFLEX MICROSCOPIC    EKG EKG Interpretation  Date/Time:  Wednesday Sep 11 2019 10:56:31 EDT Ventricular Rate:  87 PR Interval:  212 QRS Duration: 98 QT Interval:  360 QTC Calculation: 433 R Axis:   90 Text Interpretation: Sinus rhythm with 1st degree A-V block Rightward axis Borderline ECG  Confirmed by Fredia Sorrow [phone]) on 09/11/2019 11:29:16 AM   Radiology No results found.  Procedures Procedures (including critical care time)  Medications Ordered in ED Medications  sodium chloride flush (NS) 0.9 % injection 3 mL (has no administration in time range)    ED Course  I have reviewed the triage vital signs and the nursing notes.  Pertinent labs & imaging results that were available during my care of the patient were reviewed by me and considered in my medical decision making (see chart for details).    MDM Rules/Calculators/A&P                      Patient will receive work-up to see if we can find a reason for nursing home placement.  Will do CT head due to the falls.  If negative will do MRI brain.  And will also do CT right hip to rule out a hairline fracture.  Patient's labs without significant abnormalities other than a mild hyperkalemia with a potassium of 5.2.  But nothing serious about that.  Patient has some renal insufficiency but not significantly changed from baseline.  A little bit of leukocytosis.  But no anemia.  Patient turned over to evening emergency physician who will follow up on the scan and MRI results.  KG chest x-ray without any acute findings.  If everything negative may have to have social work evaluation consideration for nursing home placement. Final Clinical Impression(s) / ED Diagnoses Final diagnoses:  None    Rx / DC Orders ED Discharge Orders    None       Fredia Sorrow, MD 09/11/19 [phone]
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
## 2019-09-11 NOTE — ED Notes (Signed)
Pt returned from MRI. Pt sitting up at side of the bed. Pt provided meal.

## 2019-09-11 NOTE — ED Notes (Signed)
Pt transported to CT ?

## 2019-09-11 NOTE — ED Provider Notes (Addendum)
Freq falls, known pelvic fx old. CT Head and right hip, MRI brain if CT negative. If medical no etiology, Ortho sent with consideration for placement due frequent falls and difficulty at home with ADL and falls. Physical Exam  BP 123/68   Pulse 68   Temp 97.6 F (36.4 C) (Oral)   Resp 13   Ht 6' (1.829 m)   Wt 81.6 kg   SpO2 100%   BMI 24.41 kg/m   Physical Exam  ED Course/Procedures     Procedures  MDM  No urinary tract infection.  No acute findings on CT head.  No significant metabolic derangement.  Will order MRI per evaluation anticipated by Dr. Rogene Houston for frequent falls.  19: 06.  Patient is now an MRI.  I have spoken with the patient's wife.  She reports she does not wish to pursue skilled nursing facility placement.  She reports they have long-term healthcare insurance and she will get assistance in the home but does not plan on placing her husband in a nursing facility.  She feels that she has appropriate care to continue managing him at home.  Patient and wife updated on MRI findings of chronic lacunar, cerebellar and thalamic small vessel ischemic disease..  No acute infarct on MRI.  Counseled on the cumulative nature of these small recurrent infarcts.  These may be responsible for imbalance.  Patient is on an 81 mg daily aspirin dose.  Patient's wife reports he has had a couple episodes of GI bleeding.  Likely not a candidate for addition of Plavix.  They will follow up with PCP.  Counseled on holding potassium for several days and rechecking at the beginning of the week.  He has been taking supplemental potassium this week.  Patient is alert in no distress.  He sitting up to eat a meal tray.  Stable for discharge.       Charlesetta Shanks, MD 09/11/19 Dionicia Abler    Charlesetta Shanks, MD 09/11/19 Minus Liberty, MD 09/11/19 1945    Charlesetta Shanks, MD 09/11/19 2011

## 2019-09-11 NOTE — ED Notes (Signed)
Discharge instructions discussed with pt. And wife at bedside. Were able to understand instructions with no other questions at this time. Pt to go home with wife

## 2019-09-12 ENCOUNTER — Encounter: Payer: Medicare Other | Admitting: Physical Therapy

## 2019-09-17 ENCOUNTER — Encounter: Payer: Medicare Other | Admitting: Physical Therapy

## 2019-09-19 ENCOUNTER — Encounter: Payer: Medicare Other | Admitting: Rehabilitative and Restorative Service Providers"

## 2019-09-20 ENCOUNTER — Encounter: Payer: Self-pay | Admitting: Specialist

## 2019-09-20 ENCOUNTER — Other Ambulatory Visit: Payer: Self-pay

## 2019-09-20 ENCOUNTER — Ambulatory Visit: Payer: Self-pay

## 2019-09-20 ENCOUNTER — Ambulatory Visit (INDEPENDENT_AMBULATORY_CARE_PROVIDER_SITE_OTHER): Payer: Medicare Other | Admitting: Specialist

## 2019-09-20 VITALS — BP 103/62 | HR 71 | Ht 72.0 in | Wt 187.0 lb

## 2019-09-20 DIAGNOSIS — I639 Cerebral infarction, unspecified: Secondary | ICD-10-CM | POA: Diagnosis not present

## 2019-09-20 DIAGNOSIS — S32591A Other specified fracture of right pubis, initial encounter for closed fracture: Secondary | ICD-10-CM | POA: Diagnosis not present

## 2019-09-20 DIAGNOSIS — S32691A Other specified fracture of right ischium, initial encounter for closed fracture: Secondary | ICD-10-CM

## 2019-09-20 DIAGNOSIS — I6381 Other cerebral infarction due to occlusion or stenosis of small artery: Secondary | ICD-10-CM

## 2019-09-20 NOTE — Progress Notes (Signed)
Office Visit Note   Patient: [name]           Date of Birth: 08-Sep-1933           MRN: [id number] Visit Date: 09/20/2019              Requested by: [name], [address] [employer] [address] [place],  [place] [address] PCP: [name], MD   Assessment & Plan: Visit Diagnoses:  1. Other closed fracture of right ischium, initial encounter ([place])   2. Closed fracture of right inferior pubic ramus, initial encounter ([place])   3. Basal ganglia stroke ([place])        Therapy daily . Call if fever or chills or increased drainage. Go to ER if acutely short of breath or call for ambulance. Return for follow up in 4 weeks. May full weight bear on the right leg In house walking for first 2 weeks.  Vitamin D 5,000 IU daily  Calcium 1500 mg per day, 200 mg per dairy portion. Pain control with tylenol and ibuprofen. Aspirin for thinning blood to prevent strokes and also to prevent blood clots in the legs.  Right pubic and ischial fractures are outside of the socket and do not involve the hip or joints. They will heal but it takes about 6 weeks for the initial healing and less pain and at 3 months they should be healed. It is okay to bear weight and to walk as tolerated. HHN for PT and assistance in standing and walking. Neurology evaluation due to increased numbers of stroke areas in the brain since 2019.  Follow-Up Instructions: Return in about 4 weeks (around 10/18/2019).   Orders:  Orders Placed This Encounter  Procedures  . XR Pelvis 1-2 Views  . Ambulatory referral to Neurology  . Ambulatory referral to Home Health   No orders of the defined types were placed in this encounter.     Procedures: No procedures performed   Clinical Data: No additional findings.   Subjective: Chief Complaint  Patient presents with  . Pelvis - Fracture, Follow-up    86 year old male with history of lumbar spinal stenosis and spondylosis. He has had a recent event  with dizziness causing a fall and  Right sided inferior pubic ramus fracture and right ischial fracture. These are non displaced and the original injury was on 5/5. There is right sided pain and leg weakness. He had a recent MRI of the brain demonstrating no acute changes but chronic basal ganglia  Thalamic and cerebellar infarct which have increased since his last study in 2019. He is taking tylenol and ibuprofen for pain no narcotics as these cause him extreme sedation and loss of balance. No bowel or bladder difficulty.    Review of Systems  Constitutional: Positive for fever. Negative for activity change, appetite change, chills, diaphoresis, fatigue and unexpected weight change.  HENT: Negative.  Negative for congestion, dental problem, drooling, ear discharge, ear pain, facial swelling, hearing loss, mouth sores, nosebleeds, postnasal drip, rhinorrhea, sinus pressure, sinus pain, sneezing, sore throat, tinnitus, trouble swallowing and voice change.   Eyes: Negative.  Negative for photophobia, pain, discharge, redness, itching and visual disturbance.  Respiratory: Negative.  Negative for apnea, cough, choking, chest tightness, shortness of breath, wheezing and stridor.   Cardiovascular: Negative.  Negative for chest pain, palpitations and leg swelling.  Gastrointestinal: Negative.  Negative for abdominal distention, abdominal pain, anal bleeding, blood in stool, constipation, diarrhea, nausea, rectal pain and vomiting.  Endocrine: Negative for heat intolerance, polydipsia, polyphagia and polyuria.  Genitourinary: Positive for difficulty urinating (prostate infection treated recently with antibiotics), frequency and urgency. Negative for enuresis, flank pain and hematuria.  Musculoskeletal: Positive for gait problem. Negative for arthralgias, back pain, joint swelling, myalgias, neck pain and neck stiffness.  Skin: Negative.   Allergic/Immunologic: Negative.  Negative for environmental  allergies, food allergies and immunocompromised state.  Neurological: Positive for dizziness, weakness and light-headedness. Negative for tremors, seizures, syncope, facial asymmetry, speech difficulty, numbness and headaches.  Hematological: Negative.  Negative for adenopathy. Does not bruise/bleed easily.  Psychiatric/Behavioral: Negative.  Negative for agitation, behavioral problems, confusion, decreased concentration, dysphoric mood, hallucinations, sleep disturbance and suicidal ideas. The patient is not nervous/anxious and is not hyperactive.      Objective: Vital Signs: BP 103/62 (BP Location: Left Arm, Patient Position: Sitting)   Pulse 71   Ht 6' (1.829 m)   Wt 187 lb (84.8 kg)   BMI 25.36 kg/m   Physical Exam Constitutional:      Appearance: He is well-developed.  HENT:     Head: Normocephalic and atraumatic.  Eyes:     Pupils: Pupils are equal, round, and reactive to light.  Pulmonary:     Effort: Pulmonary effort is normal.     Breath sounds: Normal breath sounds.  Abdominal:     General: Bowel sounds are normal.     Palpations: Abdomen is soft.  Musculoskeletal:     Cervical back: Normal range of motion and neck supple.     Lumbar back: Positive right straight leg raise test. Negative left straight leg raise test.  Skin:    General: Skin is warm and dry.  Neurological:     Mental Status: He is alert and oriented to person, place, and time.  Psychiatric:        Behavior: Behavior normal.        Thought Content: Thought content normal.        Judgment: Judgment normal.     Back Exam   Tenderness  The patient is experiencing tenderness in the lumbar.  Range of Motion  Extension: abnormal  Flexion: abnormal  Lateral bend right: abnormal  Lateral bend left: abnormal  Rotation right: abnormal  Rotation left: abnormal   Muscle Strength  Right Quadriceps:  4/5  Left Quadriceps:  5/5  Right Hamstrings:  4/5  Left Hamstrings:  5/5   Tests  Straight leg  raise right: positive Straight leg raise left: negative  Reflexes  Patellar: normal Achilles: normal Babinski's sign: normal   Other  Toe walk: abnormal Heel walk: abnormal Gait: antalgic  Erythema: no back redness Scars: absent  Comments:  Right hip flexion weakness, right knee extension weakness and right foot DF and PF weakness more proximal than distal.       Specialty Comments:  No specialty comments available.  Imaging: XR Pelvis 1-2 Views  Result Date: 09/20/2019 AP pelvis with right ischial and right inferior pubic ramus fractures, non displaced no change in position and alignment. DDD and spondylosis of the lower lumbar spine with mild scoliosis. Hips without abnormality.     PMFS History: Patient Active Problem List   Diagnosis Date Noted  . At high risk for falls 05/29/2019  . Chronic bilateral low back pain without sciatica 02/07/2019  . Statin intolerance 11/16/2018  . Hyperlipidemia, mixed 08/15/2018  . Low back pain 07/18/2018  . History of lacunar cerebrovascular accident (CVA) 02/12/2018  . Dizziness 02/12/2018  . Myofascial pain 12/18/2017  .  Spondylosis without myelopathy or radiculopathy, lumbar region 12/18/2017  . Lumbar radiculopathy, right 12/18/2017  . Cholelithiasis 05/17/2017  . Sinus Bradycardia 05/17/2017  . Thoracic radiculopathy 04/12/2017  . Primary osteoarthritis of right knee 12/14/2016  . DDD (degenerative disc disease), lumbar 10/10/2016  . Lumbar facet arthropathy 08/29/2016  . Idiopathic scoliosis 03/22/2016  . Diabetic neuropathy ([place]) 03/22/2016  . Dysautonomia orthostatic hypotension syndrome ([place]) 02/25/2016  . CKD stage 3 due to type 2 diabetes mellitus ([place]) 02/19/2016  . Coronary artery disease involving coronary bypass graft of native heart without angina pectoris   . Diabetes mellitus type 2, diet-controlled ([place])   . 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 Medication management 08/11/2015  . S/P CABG x 4 07/06/2015    . PVD (peripheral vascular disease) ([place]) 01/01/2014  . Hyperlipidemia associated with type 2 diabetes mellitus ([place]) 04/11/2013  . Supine hypertension   . Vitamin D deficiency   . Vitamin B 12 deficiency   . Coronary atherosclerosis- s/p PCI to LAD in 2009 and PCI to RCA in 2011 02/27/2009  . Abdominal aortic aneurysm ([place]) 02/27/2009  . GERD 02/27/2009   Past Medical History:  Diagnosis Date  . 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 Colon polyps   . Coronary atherosclerosis of unspecified type of vessel, native or graft   . Diabetes ([place])   . Difficult intubation   . 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 Esophageal reflux   . High cholesterol   . History of IBS 02/27/2009  . Hypertension    pt denies, he says he has a h/o hypotension. If BP up he adjusts the [place]  . Jejunitis with partial SBO 05/20/2017  . Orthostatic hypotension    "BP has been dropping alot when I stand up for the last month or so" (02/17/2016)  . Prostatitis   . Upper GI bleed 06/24/2018  . Vitamin B 12 deficiency   . Vitamin D deficiency     Family History  Problem Relation Age of Onset  . Heart attack Father        died age 63  . Heart attack Brother        died age 57  . Anuerysm Brother        aortic  . Heart attack Sister        died age 9  . Colon cancer Sister   . Liver cancer Sister   . Diabetes Maternal Grandmother   . Arthritis Mother   . Dementia Mother   . Heart Problems Brother   . Heart Problems Brother   . Heart Problems Brother   . Heart Problems Brother   . Healthy Daughter   . Healthy Son   . Esophageal cancer Neg Hx   . Inflammatory bowel disease Neg Hx   . Pancreatic cancer Neg Hx   . Stomach cancer Neg Hx     Past Surgical History:  Procedure Laterality Date  . BIOPSY  06/25/2018   Procedure: BIOPSY;  Surgeon: Rush Landmark Telford Nab., MD;  Location: [facility];  Service: Gastroenterology;;  . BIOPSY  04/24/2019   Procedure: BIOPSY;  Surgeon: Irving Copas., MD;  Location: [facility];  Service: Gastroenterology;;  . BLADDER SURGERY  1969   traumatic pelvic fractures, urethral and bladder repair  . CARDIAC CATHETERIZATION N/A 07/01/2015   Procedure: Left Heart Cath and Coronary Angiography;  Surgeon: Wellington Hampshire, MD;  Location: [facility];  Service: Cardiovascular;  Laterality: N/A;  . COLON RESECTION N/A 05/17/2017  Procedure: DIAGNOSTIC LAPAROSCOPY,;  Surgeon: Leighton Ruff, MD;  Location: [facility];  Service: General;  Laterality: N/A;  . COLONOSCOPY WITH PROPOFOL N/A 04/24/2019   Procedure: COLONOSCOPY WITH PROPOFOL;  Surgeon: Irving Copas., MD;  Location: [facility];  Service: Gastroenterology;  Laterality: N/A;  . CORONARY ANGIOPLASTY WITH STENT PLACEMENT    . CORONARY ARTERY BYPASS GRAFT N/A 07/06/2015   Procedure: CORONARY ARTERY BYPASS GRAFTING (CABG)x 4   utilizing the left internal mammary artery and endoscopically harvested bilateral  sapheneous vein.;  Surgeon: Ivin Poot, MD;  Location: [facility];  Service: Open Heart Surgery;  Laterality: N/A;  . ESOPHAGEAL DILATION  04/24/2019   Procedure: ESOPHAGEAL DILATION;  Surgeon: Rush Landmark Telford Nab., MD;  Location: [facility];  Service: Gastroenterology;;  . ESOPHAGOGASTRODUODENOSCOPY (EGD) WITH PROPOFOL N/A 06/25/2018   Procedure: ESOPHAGOGASTRODUODENOSCOPY (EGD) WITH PROPOFOL;  Surgeon: Irving Copas., MD;  Location: [facility];  Service: Gastroenterology;  Laterality: N/A;  . ESOPHAGOGASTRODUODENOSCOPY (EGD) WITH PROPOFOL N/A 04/24/2019   Procedure: ESOPHAGOGASTRODUODENOSCOPY (EGD) WITH PROPOFOL;  Surgeon: Rush Landmark Telford Nab., MD;  Location[facility];  Service: Gastroenterology;  Laterality: N/A;  . HEMOSTASIS CLIP PLACEMENT  04/24/2019   Procedure: HEMOSTASIS CLIP PLACEMENT;  Surgeon: Irving Copas., MD;  [facility];  Service: Gastroenterology;;  . KNEE SURGERY    . LOOP RECORDER INSERTION N/A 02/19/2018   Procedure:  LOOP RECORDER INSERTION;  Surgeon: Deboraha Sprang, MD;  Location: [facility];  Service: Cardiovascular;  Laterality: N/A;  . POLYPECTOMY  04/24/2019   Procedure: POLYPECTOMY;  Surgeon: Rush Landmark Telford Nab., MD;  Location: [facility];  Service: Gastroenterology;;  . Lia Foyer LIFTING INJECTION  04/24/2019   Procedure: SUBMUCOSAL LIFTING INJECTION;  Surgeon: Irving Copas., MD;  [facility];  Service: Gastroenterology;;  . TEE WITHOUT CARDIOVERSION N/A 07/06/2015   Procedure: TRANSESOPHAGEAL ECHOCARDIOGRAM (TEE);  Surgeon: Ivin Poot, MD;  Location: [facility];  Service: Open Heart Surgery;  Laterality: N/A;   Social History   Occupational History  . Occupation: Retired  Tobacco Use  . Smoking status: Former Smoker    Quit date: 07/16/1963    Years since quitting: 56.2  . Smokeless tobacco: Former Systems developer    Types: Chew    Quit date: 1989  Substance and Sexual Activity  . Alcohol use: No  . Drug use: No  . Sexual activity: Not Currently

## 2019-09-20 NOTE — Patient Instructions (Addendum)
     Therapy daily . Call if fever or chills or increased drainage. Go to ER if acutely short of breath or call for ambulance. Return for follow up in 2 weeks. May full weight bear on the right leg In house walking for first 2 weeks.  Vitamin D 5,000 IU daily  Calcium 1500 mg per day, 200 mg per dairy portion. Pain control with tylenol and ibuprofen. Aspirin for thinning blood to prevent strokes and also to prevent blood clots in the legs. Right pubic and ischial fractures are outside of the socket and do not involve the hip or joints. They will heal but it takes about 6 weeks for the initial healing and less pain and at 3 months they should be healed. It is okay to bear weight and to walk as tolerated. HHN for PT and assistance in standing and walking. Neurology evaluation due to increased numbers of stroke areas in the brain since 2019.

## 2019-09-23 ENCOUNTER — Telehealth: Payer: Self-pay | Admitting: Specialist

## 2019-09-23 NOTE — Telephone Encounter (Signed)
error 

## 2019-09-24 ENCOUNTER — Encounter: Payer: Medicare Other | Admitting: Physical Therapy

## 2019-09-25 ENCOUNTER — Emergency Department (HOSPITAL_COMMUNITY): Payer: Medicare Other

## 2019-09-25 ENCOUNTER — Emergency Department (HOSPITAL_COMMUNITY)
Admission: EM | Admit: 2019-09-25 | Discharge: 2019-09-25 | Disposition: A | Discharge status: Home / Self Care | Payer: Medicare Other | Attending: Emergency Medicine | Admitting: Emergency Medicine

## 2019-09-25 ENCOUNTER — Other Ambulatory Visit: Payer: Self-pay

## 2019-09-25 ENCOUNTER — Telehealth: Payer: Self-pay | Admitting: *Deleted

## 2019-09-25 DIAGNOSIS — R531 Weakness: Secondary | ICD-10-CM | POA: Diagnosis not present

## 2019-09-25 DIAGNOSIS — Z5321 Procedure and treatment not carried out due to patient leaving prior to being seen by health care provider: Secondary | ICD-10-CM | POA: Insufficient documentation

## 2019-09-25 LAB — I-STAT CHEM 8, ED
BUN: 28 mg/dL — ABNORMAL HIGH (ref 8–23)
Calcium, Ion: 1.15 mmol/L (ref 1.15–1.40)
Chloride: 104 mmol/L (ref 98–111)
Creatinine, Ser: 1.6 mg/dL — ABNORMAL HIGH (ref 0.61–1.24)
Glucose, Bld: 117 mg/dL — ABNORMAL HIGH (ref 70–99)
HCT: 38 % — ABNORMAL LOW (ref 39.0–52.0)
Hemoglobin: 12.9 g/dL — ABNORMAL LOW (ref 13.0–17.0)
Potassium: 4.4 mmol/L (ref 3.5–5.1)
Sodium: 140 mmol/L (ref 135–145)
TCO2: 31 mmol/L (ref 22–32)

## 2019-09-25 LAB — DIFFERENTIAL
Abs Immature Granulocytes: 0.04 10*3/uL (ref 0.00–0.07)
Basophils Absolute: 0.1 10*3/uL (ref 0.0–0.1)
Basophils Relative: 1 %
Eosinophils Absolute: 0.3 10*3/uL (ref 0.0–0.5)
Eosinophils Relative: 3 %
Immature Granulocytes: 1 %
Lymphocytes Relative: 29 %
Lymphs Abs: 2.5 10*3/uL (ref 0.7–4.0)
Monocytes Absolute: 0.5 10*3/uL (ref 0.1–1.0)
Monocytes Relative: 5 %
Neutro Abs: 5.3 10*3/uL (ref 1.7–7.7)
Neutrophils Relative %: 61 %

## 2019-09-25 LAB — COMPREHENSIVE METABOLIC PANEL
ALT: 14 U/L (ref 0–44)
AST: 22 U/L (ref 15–41)
Albumin: 3.3 g/dL — ABNORMAL LOW (ref 3.5–5.0)
Alkaline Phosphatase: 210 U/L — ABNORMAL HIGH (ref 38–126)
Anion gap: 8 (ref 5–15)
BUN: 27 mg/dL — ABNORMAL HIGH (ref 8–23)
CO2: 27 mmol/L (ref 22–32)
Calcium: 9.3 mg/dL (ref 8.9–10.3)
Chloride: 105 mmol/L (ref 98–111)
Creatinine, Ser: 1.53 mg/dL — ABNORMAL HIGH (ref 0.61–1.24)
GFR calc Af Amer: 48 mL/min — ABNORMAL LOW (ref >60)
GFR calc non Af Amer: 41 mL/min — ABNORMAL LOW (ref >60)
Glucose, Bld: 118 mg/dL — ABNORMAL HIGH (ref 70–99)
Potassium: 4.4 mmol/L (ref 3.5–5.1)
Sodium: 140 mmol/L (ref 135–145)
Total Bilirubin: 0.8 mg/dL (ref 0.3–1.2)
Total Protein: 6.7 g/dL (ref 6.5–8.1)

## 2019-09-25 LAB — PROTIME-INR
INR: 1.1 (ref 0.8–1.2)
Prothrombin Time: 13.5 seconds (ref 11.4–15.2)

## 2019-09-25 LAB — CBC
HCT: 39 % (ref 39.0–52.0)
Hemoglobin: 12.6 g/dL — ABNORMAL LOW (ref 13.0–17.0)
MCH: 32.4 pg (ref 26.0–34.0)
MCHC: 32.3 g/dL (ref 30.0–36.0)
MCV: 100.3 fL — ABNORMAL HIGH (ref 80.0–100.0)
Platelets: 237 10*3/uL (ref 150–400)
RBC: 3.89 MIL/uL — ABNORMAL LOW (ref 4.22–5.81)
RDW: 13.8 % (ref 11.5–15.5)
WBC: 8.6 10*3/uL (ref 4.0–10.5)
nRBC: 0 % (ref 0.0–0.2)

## 2019-09-25 LAB — APTT: aPTT: 34 seconds (ref 24–36)

## 2019-09-25 MED ORDER — SODIUM CHLORIDE 0.9% FLUSH: 3.0000 mL | Freq: Once | INTRAVENOUS | Status: DC

## 2019-09-25 NOTE — ED Notes (Signed)
Melody (Niece#[phone]) called/want to see if his wife can come sit with him while he is waiting for room.  Niece stated patient is confused.  Thank you

## 2019-09-25 NOTE — ED Notes (Signed)
[facility] EMT removed pt IV and patient left AMA

## 2019-09-25 NOTE — Telephone Encounter (Signed)
Daughter called and reported the patient is at the [facility] ED and his spouse is not allowed to come sit with him.  The patient is confused and unable to answer the questions. The spouse is at a window looking in to make sure he is OK.  Dr Melford Aase is aware of the situation, but states there is nothing we can do to speed up the process. Daughter is aware.

## 2019-09-25 NOTE — ED Triage Notes (Signed)
Patient arrived by [facility] for weakness and confusion. Patient finishing antibiotics for prostate infection. Patient alert and denies pain. Spouse reports that he has had strong odorous urine for same. Patient with no obvious deficits-

## 2019-09-26 ENCOUNTER — Telehealth: Payer: Self-pay | Admitting: *Deleted

## 2019-09-26 ENCOUNTER — Encounter: Payer: Medicare Other | Admitting: Physical Therapy

## 2019-09-26 MED ORDER — SODIUM CHLORIDE 0.9 % IV SOLN
10.00 | INTRAVENOUS | Status: DC
Start: ? — End: 2019-09-26

## 2019-09-26 MED ORDER — GENERIC EXTERNAL MEDICATION
Status: DC
Start: ? — End: 2019-09-26

## 2019-09-26 NOTE — Telephone Encounter (Signed)
Patient's spouse called and reported the patient left the ED at [facility] last PM, due to long wait and was never seen by a physician. Today, he continues to have an altered mental state, with very slow processing. Spouse reported he is so weak, it required 2 people to move him from the bed to a chair this morning. Dr Melford Aase reviewed the labs and CT done 09/25/2019 at [facility] and the spouse was advised the CT showed showed no acute problem and the labs did not show any large changes. Dr Melford Aase also advised the patient needs to return to the ED for further evaluation, due to continued symptoms. The patient's family were not receptive to retuning to [facility] ED today, so Dr Melford Aase agreed he can to go [facility] in [place], [place].

## 2019-09-27 ENCOUNTER — Other Ambulatory Visit: Payer: Self-pay | Admitting: Internal Medicine

## 2019-09-27 DIAGNOSIS — R339 Retention of urine, unspecified: Secondary | ICD-10-CM

## 2019-09-27 MED ORDER — GENERIC EXTERNAL MEDICATION
Status: DC
Start: ? — End: 2019-09-27

## 2019-10-01 ENCOUNTER — Encounter: Payer: Medicare Other | Admitting: Physical Therapy

## 2019-10-01 ENCOUNTER — Telehealth: Payer: Self-pay | Admitting: *Deleted

## 2019-10-01 NOTE — Telephone Encounter (Signed)
Spouse called and asked if the patient can drink Liquid IV, a hydration multiplier that contains Niacin. Per Dr Melford Aase, he advises the patient drink Gatorade instead, since it does not contain Niacin, which is known to increase stroke risk.  Spouse is aware.

## 2019-10-03 ENCOUNTER — Ambulatory Visit: Payer: Medicare Other | Admitting: Neurology

## 2019-10-03 ENCOUNTER — Other Ambulatory Visit: Payer: Self-pay

## 2019-10-03 ENCOUNTER — Encounter: Payer: Medicare Other | Admitting: Physical Therapy

## 2019-10-03 ENCOUNTER — Encounter: Payer: Self-pay | Admitting: Neurology

## 2019-10-03 VITALS — BP 158/80 | HR 64 | Ht 74.0 in | Wt 195.0 lb

## 2019-10-03 DIAGNOSIS — R42 Dizziness and giddiness: Secondary | ICD-10-CM | POA: Diagnosis not present

## 2019-10-03 DIAGNOSIS — G459 Transient cerebral ischemic attack, unspecified: Secondary | ICD-10-CM

## 2019-10-03 DIAGNOSIS — E1142 Type 2 diabetes mellitus with diabetic polyneuropathy: Secondary | ICD-10-CM | POA: Diagnosis not present

## 2019-10-03 MED ORDER — ASPIRIN EC 81 MG PO TBEC
182.0000 mg | DELAYED_RELEASE_TABLET | Freq: Every day | ORAL | Status: DC
Start: 2019-10-03 — End: 2020-10-12

## 2019-10-03 NOTE — Patient Instructions (Addendum)
I had a long discussion with the patient and his wife regarding his transient episode of left facial droop and left arm weakness likely representing right brain subcortical TIA from small vessel disease.  He also has chronic dizziness following his prior strokes and diabetic peripheral neuropathy with both of which appears stable.  I recommend he increase the dose of aspirin to 2 tablets of 81 mg daily and maintain aggressive risk factor modification with strict control of hypertension the blood pressure goal below 130/90, lipids with LDL cholesterol goal below 70 mg percent and diabetes with hemoglobin A1c goal below 6.5%.  Check lipid profile and hemoglobin A1c today as well as carotid ultrasound and transcranial Doppler studies.  He was advised to use his walker at all times and continue Lyrica for his diabetic neuropathy.  He was advised to get up slowly and avoid sudden movements and we discussed fall and safety precautions for his dizziness as well.  He will return for follow-up in the future in 2 months with Janett Billow my nurse practitioner on call earlier if necessary.  Fall Prevention in the Home, Adult Falls can cause injuries. They can happen to people of all ages. There are many things you can do to make your home safe and to help prevent falls. Ask for help when making these changes, if needed. What actions can I take to prevent falls? General Instructions  Use good lighting in all rooms. Replace any light bulbs that burn out.  Turn on the lights when you go into a dark area. Use night-lights.  Keep items that you use often in easy-to-reach places. Lower the shelves around your home if necessary.  Set up your furniture so you have a clear path. Avoid moving your furniture around.  Do not have throw rugs and other things on the floor that can make you trip.  Avoid walking on wet floors.  If any of your floors are uneven, fix them.  Add color or contrast paint or tape to clearly mark and  help you see: ? Any grab bars or handrails. ? First and last steps of stairways. ? Where the edge of each step is.  If you use a stepladder: ? Make sure that it is fully opened. Do not climb a closed stepladder. ? Make sure that both sides of the stepladder are locked into place. ? Ask someone to hold the stepladder for you while you use it.  If there are any pets around you, be aware of where they are. What can I do in the bathroom?      Keep the floor dry. Clean up any water that spills onto the floor as soon as it happens.  Remove soap buildup in the tub or shower regularly.  Use non-skid mats or decals on the floor of the tub or shower.  Attach bath mats securely with double-sided, non-slip rug tape.  If you need to sit down in the shower, use a plastic, non-slip stool.  Install grab bars by the toilet and in the tub and shower. Do not use towel bars as grab bars. What can I do in the bedroom?  Make sure that you have a light by your bed that is easy to reach.  Do not use any sheets or blankets that are too big for your bed. They should not hang down onto the floor.  Have a firm chair that has side arms. You can use this for support while you get dressed. What can I do  in the kitchen?  Clean up any spills right away.  If you need to reach something above you, use a strong step stool that has a grab bar.  Keep electrical cords out of the way.  Do not use floor polish or wax that makes floors slippery. If you must use wax, use non-skid floor wax. What can I do with my stairs?  Do not leave any items on the stairs.  Make sure that you have a light switch at the top of the stairs and the bottom of the stairs. If you do not have them, ask someone to add them for you.  Make sure that there are handrails on both sides of the stairs, and use them. Fix handrails that are broken or loose. Make sure that handrails are as long as the stairways.  Install non-slip stair  treads on all stairs in your home.  Avoid having throw rugs at the top or bottom of the stairs. If you do have throw rugs, attach them to the floor with carpet tape.  Choose a carpet that does not hide the edge of the steps on the stairway.  Check any carpeting to make sure that it is firmly attached to the stairs. Fix any carpet that is loose or worn. What can I do on the outside of my home?  Use bright outdoor lighting.  Regularly fix the edges of walkways and driveways and fix any cracks.  Remove anything that might make you trip as you walk through a door, such as a raised step or threshold.  Trim any bushes or trees on the path to your home.  Regularly check to see if handrails are loose or broken. Make sure that both sides of any steps have handrails.  Install guardrails along the edges of any raised decks and porches.  Clear walking paths of anything that might make someone trip, such as tools or rocks.  Have any leaves, snow, or ice cleared regularly.  Use sand or salt on walking paths during winter.  Clean up any spills in your garage right away. This includes grease or oil spills. What other actions can I take?  Wear shoes that: ? Have a low heel. Do not wear high heels. ? Have rubber bottoms. ? Are comfortable and fit you well. ? Are closed at the toe. Do not wear open-toe sandals.  Use tools that help you move around (mobility aids) if they are needed. These include: ? Canes. ? Walkers. ? Scooters. ? Crutches.  Review your medicines with your doctor. Some medicines can make you feel dizzy. This can increase your chance of falling. Ask your doctor what other things you can do to help prevent falls. Where to find more information  Centers for Disease Control and Prevention, STEADI: https://garcia.biz/  [employer] on Aging: BrainJudge.co.uk Contact a doctor if:  You are afraid of falling at home.  You feel weak, drowsy, or dizzy at  home.  You fall at home. Summary  There are many simple things that you can do to make your home safe and to help prevent falls.  Ways to make your home safe include removing tripping hazards and installing grab bars in the bathroom.  Ask for help when making these changes in your home. This information is not intended to replace advice given to you by your health care provider. Make sure you discuss any questions you have with your health care provider. Document Revised: 08/09/2018 Document Reviewed: 12/01/2016 Elsevier Patient Education  2020 [employer] Inc.  

## 2019-10-03 NOTE — Progress Notes (Signed)
[facility] [address] [place]. [place] [address] [id number]       OFFICE CONSULT NOTE  [name] Date of Birth:  02-11-1934 Medical Record Number:  [id number]   Referring MD:  Basil Dess  Reason for Referral:  stroke HPI: [name] is a pleasant 86 year old Caucasian male with past medical history of peripheral vascular disease, coronary artery disease, diabetes, hypertension, hyperlipidemia, orthostatic hypotension, vitamin B12 deficiency, arthritis with multiple strokes who is seen today for a consultation visit accompanied by his wife.  History is obtained from them and review of electronic medical records and I personally reviewed imaging films in PACS.  Patient was recently seen in the emergency room on 09/25/2019 with an episode of altered mental status, confusion and EMS apparently noticed left upper extremity drift.  Patient was been treated for UTI with antibiotics.  His symptoms resolved.  CT scan was unremarkable MRI scan of the brain was obtained which I personally reviewed showed no acute abnormalities.  Old bilateral basal ganglia and cerebellar lacunar infarcts are noted.  He has subsequently been referred to urology but the referral is pending.  Patient complains of dizziness which is constant and seems to be more aggravated with upright position.  His blood pressure tends to run on the low side he is on Florinef.  Patient had a fall last month and sustained a pelvic fracture he is in pain and does walk with a walker.  He also has chronic diabetic neuropathy and takes Lyrica which seems to help neuropathic pain but he does have imbalance from this.  He is also had multiple strokes and was previously seen in our practice in 2019 for right temporal stroke as well as subsequently later followed corpus callosum lacunar infarct.  He also has mild baseline dementia which as per the wife is stable.  He is presently on aspirin for stroke prevention and  tolerating it well.  His blood pressure seems under good control and tends to run low.  His diabetes control has also been satisfactory though has not had recent A1c of lipid profile that I can see in the computer.  He was last seen in the practice on 12/03/2018 by [name] nurse practitioner  ROS:   14 system review of systems is positive for altered mental status, weakness, paresthesias, imbalance, urinary retention all other systems negative  PMH:  Past Medical History:  Diagnosis Date  . 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 Colon polyps   . Coronary atherosclerosis of unspecified type of vessel, native or graft   . Diabetes ([place])   . Difficult intubation   . 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 Esophageal reflux   . High cholesterol   . History of IBS 02/27/2009  . Hypertension    pt denies, he says he has a h/o hypotension. If BP up he adjusts the Florinef  . Jejunitis with partial SBO 05/20/2017  . Orthostatic hypotension    "BP has been dropping alot when I stand up for the last month or so" (02/17/2016)  . Prostatitis   . Upper GI bleed 06/24/2018  . Vitamin B 12 deficiency   . Vitamin D deficiency     Social History:  Social History   Socioeconomic History  . Marital status: Married    Spouse name: Not on file  . Number of children: 2  . Years of education: Not on file  . Highest education level: High school graduate  Occupational History  .  Occupation: Retired  Tobacco Use  . Smoking status: Former Smoker    Quit date: 07/16/1963    Years since quitting: 56.2  . Smokeless tobacco: Former Systems developer    Types: Chew    Quit date: 1989  Substance and Sexual Activity  . Alcohol use: No  . Drug use: No  . Sexual activity: Not Currently  Other Topics Concern  . Not on file  Social History Narrative   Lives at home with his wife [name]   Right handed   No caffeine   Social Determinants of Health   Financial Resource Strain:   . Difficulty of Paying Living  Expenses:   Food Insecurity:   . Worried About Charity fundraiser in the Last Year:   . Arboriculturist in the Last Year:   Transportation Needs:   . Film/video editor (Medical):   Marland Kitchen Lack of Transportation (Non-Medical):   Physical Activity:   . Days of Exercise per Week:   . Minutes of Exercise per Session:   Stress:   . Feeling of Stress :   Social Connections:   . Frequency of Communication with Friends and Family:   . Frequency of Social Gatherings with Friends and Family:   . Attends Religious Services:   . Active Member of Clubs or Organizations:   . Attends Archivist Meetings:   Marland Kitchen Marital Status:   Intimate Partner Violence:   . Fear of Current or Ex-Partner:   . Emotionally Abused:   Marland Kitchen Physically Abused:   . Sexually Abused:     Medications:   Current Outpatient Medications on File Prior to Visit  Medication Sig Dispense Refill  . acetaminophen (TYLENOL) 500 MG tablet Take 1,000 mg by mouth 2 (two) times daily as needed for headache (pain).     Marland Kitchen amLODipine (NORVASC) 10 MG tablet Take 1/2 to 1 tablet daily for BP (Patient taking differently: Take 10 mg by mouth daily. ) 90 tablet 1  . Ascorbic Acid (VITAMIN C) 500 MG CAPS Take 500 mg by mouth daily.     . Calcium Carb-Cholecalciferol (CALCIUM+D3 PO) Take 1 tablet by mouth daily.    . Cholecalciferol (VITAMIN D-3) 5000 units TABS Take 5,000 Units by mouth daily.    . fludrocortisone (FLORINEF) 0.1 MG tablet Take 2 tablets Daily for Low BP 180 tablet 0  . glucose blood test strip Check blood sugar 1 time daily-DX-E11.9 100 each 5  . Multiple Vitamin (MULTIVITAMIN WITH MINERALS) TABS tablet Take 1 tablet by mouth daily.    Marland Kitchen olmesartan (BENICAR) 40 MG tablet Take 40 mg by mouth daily.    . Omega-3 Fatty Acids (FISH OIL OMEGA-3 PO) Take 1 capsule by mouth daily.    . pantoprazole (PROTONIX) 40 MG tablet Take 1 tablet Daily for Indigestion & Heartburn (Patient taking differently: Take 40 mg by mouth daily. )  90 tablet 3  . polyethylene glycol (MIRALAX / GLYCOLAX) packet Take 17 g by mouth daily. 28 each 1  . pregabalin (LYRICA) 100 MG capsule Take 1 capsule     3 x /day for Nerve Pains 270 capsule 1  . Zinc 50 MG TABS Take 1 tablet by mouth daily.     No current facility-administered medications on file prior to visit.    Allergies:   Allergies  Allergen Reactions  . Other Other (See Comments)    Pt reports allergic to a medication but unsure of name or type of med  . Cymbalta [  Duloxetine Hcl] Other (See Comments)    Dizziness, hallucinations.   . Keflex [Cephalexin] Other (See Comments)    dizziness  . Simvastatin Other (See Comments)    Joint pain  . Sudafed [Pseudoephedrine] Other (See Comments)    Dizziness    Physical Exam General: well developed, well nourished elderly Caucasian male, seated, in no evident distress Head: head normocephalic and atraumatic.   Neck: supple with no carotid or supraclavicular bruits Cardiovascular: regular rate and rhythm, no murmurs Musculoskeletal: no deformity Skin:  no rash/petichiae Vascular:  Normal pulses all extremities  Neurologic Exam Mental Status: Awake and fully alert. Oriented to place and time. Recent and remote memory intact. Attention span, concentration and fund of knowledge appropriate. Mood and affect appropriate.  Cranial Nerves: Fundoscopic exam reveals sharp disc margins. Pupils equal, briskly reactive to light. Extraocular movements full without nystagmus. Visual fields full to confrontation. Hearing diminished slightly bilaterally. Facial sensation intact. Face, tongue, palate moves normally and symmetrically.  Motor: Normal bulk and tone. Normal strength in all tested extremity muscles. Sensory.:  Diminished touch , pinprick , position and vibratory sensation from knee down bilaterally..  Coordination: Rapid alternating movements normal in all extremities. Finger-to-nose and heel-to-shin performed accurately  bilaterally. Gait and Station: Arises from chair without difficulty. Stance is wide-based l. Gait demonstrates mild ataxia and broad-based.  Unsteady on a narrow base and when standing on either foot unsupported and while walking tandem.  Uses a walker to ambulate  Reflexes: 1+ and symmetric except both ankle jerks are depressed. Toes downgoing.   NIHSS  0 Modified Rankin  2   ASSESSMENT: 86 year old Caucasian male with transient episode of altered mental status with left Upper extremity weakness possibly right brain subcortical TIA from small vessel disease in May 2021.Marland Kitchen  Prior history of right temporal infarct in September 2019 and left corpus callosal lacunar infarct in October 2019 and remote H chronic lacunar infarcts involving right basal ganglia, cerebellum and corona radiator.  Vascular risk factors of hypertension, diabetes, hyperlipidemia and cerebrovascular disease.  He also has chronic diabetic peripheral polyneuropathy.     PLAN: I had a long discussion with the patient and his wife regarding his transient episode of left facial droop and left arm weakness likely representing right brain subcortical TIA from small vessel disease.  He also has chronic dizziness following his prior strokes and diabetic peripheral neuropathy with both of which appears stable.  I recommend he increase the dose of aspirin to 2 tablets of 81 mg daily and maintain aggressive risk factor modification with strict control of hypertension the blood pressure goal below 130/90, lipids with LDL cholesterol goal below 70 mg percent and diabetes with hemoglobin A1c goal below 6.5%.  Check lipid profile and hemoglobin A1c today as well as carotid ultrasound and transcranial Doppler studies.  He was advised to use his walker at all times and continue Lyrica for his diabetic neuropathy.  He was advised to get up slowly and avoid sudden movements and we discussed fall and safety precautions for his dizziness as well.  Greater  than 50% time during this 50-minute consultation visit was spent on counseling and coordination of care about TIA and stroke and answering questions about stroke prevention.  He will return for follow-up in the future in 2 months with [name] my nurse practitioner on call earlier if necessary. Antony Contras, MD  [facility] [address] [place], [place] [address]  Phone [phone] Fax [phone] Note: This document was prepared with  digital dictation and possible smart Company secretary. Any transcriptional errors that result from this process are unintentional.

## 2019-10-04 ENCOUNTER — Other Ambulatory Visit: Payer: Self-pay | Admitting: Neurology

## 2019-10-04 LAB — LIPID PANEL
Chol/HDL Ratio: 5.2 ratio — ABNORMAL HIGH (ref 0.0–5.0)
Cholesterol, Total: 176 mg/dL (ref 100–199)
HDL: 34 mg/dL — ABNORMAL LOW (ref >39)
LDL Chol Calc (NIH): 121 mg/dL — ABNORMAL HIGH (ref 0–99)
Triglycerides: 113 mg/dL (ref 0–149)
VLDL Cholesterol Cal: 21 mg/dL (ref 5–40)

## 2019-10-04 LAB — HEMOGLOBIN A1C
Est. average glucose Bld gHb Est-mCnc: 128 mg/dL
Hgb A1c MFr Bld: 6.1 % — ABNORMAL HIGH (ref 4.8–5.6)

## 2019-10-04 MED ORDER — ATORVASTATIN CALCIUM 40 MG PO TABS
40.0000 mg | ORAL_TABLET | Freq: Every day | ORAL | 3 refills | Status: DC
Start: 2019-10-04 — End: 2019-10-17

## 2019-10-04 NOTE — Progress Notes (Signed)
Kindly inform patient that his bad cholesterol is high and he needs to be on a statin. Recommend lipitor 40 mg daily which I have prescribed. Also his test for diabetes is borderline but acceptable

## 2019-10-07 ENCOUNTER — Telehealth: Payer: Self-pay | Admitting: Specialist

## 2019-10-07 ENCOUNTER — Telehealth: Payer: Self-pay | Admitting: *Deleted

## 2019-10-07 NOTE — Telephone Encounter (Signed)
Spouse called and asked the status of urology referral sent 09/27/2019. After calling [facility], was informed the referral was placed in their system on 10/06/2019 and patient will be contacted regarding an appointment. Spouse is aware.

## 2019-10-07 NOTE — Telephone Encounter (Signed)
Gracee, PT, from [facility] called to request VO for [facility] PT for the following:  2x a week for 4 weeks 1x a week for 5 weeks  CB#[phone].  Thank you.

## 2019-10-08 NOTE — Telephone Encounter (Signed)
I called and lmom giving verbal orders for HHPT as described below.

## 2019-10-09 ENCOUNTER — Telehealth: Payer: Self-pay

## 2019-10-09 NOTE — Telephone Encounter (Signed)
I spoke with pts wife to give lipid panel results. I stated per Dr. Leonie Man note patient that his bad cholesterol is high and he needs to be on a statin. Recommend lipitor 40 mg daily which was sent to the pharmacy. Also his test for diabetes is borderline but acceptable The wife stated pt has taken lipitor and crestor in the past and it has given him side effects with joint pain and weakness. Pts wife stated he has not taken pravastatin in the past. The wife verbalized understanding. I stated message will be sent to Dr.Sethi to review.

## 2019-10-09 NOTE — Telephone Encounter (Signed)
-----   Message from Garvin Fila, MD sent at 10/04/2019  6:17 AM EDT ----- [name] inform patient that his bad cholesterol is high and he needs to be on a statin. Recommend lipitor 40 mg daily which I have prescribed. Also his test for diabetes is borderline but acceptable

## 2019-10-14 ENCOUNTER — Ambulatory Visit (INDEPENDENT_AMBULATORY_CARE_PROVIDER_SITE_OTHER): Payer: Medicare Other | Admitting: *Deleted

## 2019-10-14 DIAGNOSIS — I639 Cerebral infarction, unspecified: Secondary | ICD-10-CM

## 2019-10-15 LAB — CUP PACEART REMOTE DEVICE CHECK
Date Time Interrogation Session: 20210613235839
Implantable Pulse Generator Implant Date: 20191021

## 2019-10-15 NOTE — Telephone Encounter (Signed)
Ask pt to see PCP to discuss other options if possible

## 2019-10-15 NOTE — Progress Notes (Signed)
Carelink Summary Report / Loop Recorder 

## 2019-10-16 ENCOUNTER — Other Ambulatory Visit: Payer: Self-pay | Admitting: Internal Medicine

## 2019-10-16 DIAGNOSIS — E782 Mixed hyperlipidemia: Secondary | ICD-10-CM

## 2019-10-16 MED ORDER — PRAVASTATIN SODIUM 40 MG PO TABS: ORAL_TABLET | ORAL | 1 refills | Status: DC

## 2019-10-16 NOTE — Telephone Encounter (Signed)
I called pts wife and stated per Dr Leonie Man to follow up with PCP for statin intolerance and their recommendations ongoing. Pt has side effects from crestor, lipitor and pravastatin. The wife verbalized understanding.

## 2019-10-17 ENCOUNTER — Other Ambulatory Visit: Payer: Self-pay | Admitting: *Deleted

## 2019-10-17 ENCOUNTER — Telehealth: Payer: Self-pay | Admitting: *Deleted

## 2019-10-17 NOTE — Telephone Encounter (Signed)
Patient called and complained of ankle swelling, since starting Atorvastatin. Patient asked to change cholesterol medication to Pravastatin. Dr Reino Bellis sent in an RX for Pravastatin 40 mg 1 tablet daily, to his pharmacy. Patient is aware.

## 2019-10-21 ENCOUNTER — Other Ambulatory Visit: Payer: Self-pay

## 2019-10-21 ENCOUNTER — Ambulatory Visit (HOSPITAL_COMMUNITY)
Admission: RE | Admit: 2019-10-21 | Discharge: 2019-10-21 | Disposition: A | Discharge status: Home / Self Care | Payer: Medicare Other | Source: Ambulatory Visit | Attending: Neurology | Admitting: Neurology

## 2019-10-21 ENCOUNTER — Ambulatory Visit (HOSPITAL_BASED_OUTPATIENT_CLINIC_OR_DEPARTMENT_OTHER)
Admission: RE | Admit: 2019-10-21 | Discharge: 2019-10-21 | Disposition: A | Discharge status: Home / Self Care | Payer: Medicare Other | Source: Ambulatory Visit | Attending: Neurology | Admitting: Neurology

## 2019-10-21 DIAGNOSIS — G459 Transient cerebral ischemic attack, unspecified: Secondary | ICD-10-CM

## 2019-10-21 NOTE — Progress Notes (Signed)
VASCULAR LAB PRELIMINARY  PRELIMINARY  PRELIMINARY  PRELIMINARY  Carotid duplex completed.    Preliminary report:  See CV proc for preliminary results.  Felicitas Sine, RVT 10/21/2019, 2:10 PM

## 2019-10-21 NOTE — Progress Notes (Signed)
VASCULAR LAB    TCD completed.    Preliminary report:  See CV proc for preliminary results.  Bert Givans, RVT 10/21/2019, 2:13 PM

## 2019-10-23 ENCOUNTER — Telehealth: Payer: Self-pay | Admitting: Specialist

## 2019-10-23 NOTE — Telephone Encounter (Signed)
Noted. Patient is scheduled for 10/31/19 with Dr. Louanne Skye

## 2019-10-23 NOTE — Telephone Encounter (Signed)
Clare Gandy from [facility] called.   Notifying us that the patient fell Thursday 6/17 outside of a restaurant. No pain or injuries.   Tom encouraged him to visit a hospital but he refused.   Call back: [phone]

## 2019-10-24 ENCOUNTER — Telehealth: Payer: Self-pay | Admitting: Specialist

## 2019-10-24 NOTE — Telephone Encounter (Signed)
Clare Gandy, PT with [facility], called to let Dr. Louanne Skye Know that the patient's BP was 160/82.  Tom's CB#[phone].  Thank you.

## 2019-10-31 ENCOUNTER — Other Ambulatory Visit: Payer: Self-pay

## 2019-10-31 ENCOUNTER — Encounter: Payer: Self-pay | Admitting: Specialist

## 2019-10-31 ENCOUNTER — Ambulatory Visit: Payer: Medicare Other | Admitting: Specialist

## 2019-10-31 ENCOUNTER — Ambulatory Visit (INDEPENDENT_AMBULATORY_CARE_PROVIDER_SITE_OTHER): Payer: Medicare Other

## 2019-10-31 VITALS — BP 162/87 | HR 65 | Ht 72.0 in | Wt 187.0 lb

## 2019-10-31 DIAGNOSIS — M1711 Unilateral primary osteoarthritis, right knee: Secondary | ICD-10-CM

## 2019-10-31 DIAGNOSIS — S32591A Other specified fracture of right pubis, initial encounter for closed fracture: Secondary | ICD-10-CM

## 2019-10-31 DIAGNOSIS — M5137 Other intervertebral disc degeneration, lumbosacral region: Secondary | ICD-10-CM

## 2019-10-31 DIAGNOSIS — M4316 Spondylolisthesis, lumbar region: Secondary | ICD-10-CM

## 2019-10-31 DIAGNOSIS — M48061 Spinal stenosis, lumbar region without neurogenic claudication: Secondary | ICD-10-CM

## 2019-10-31 DIAGNOSIS — S32691A Other specified fracture of right ischium, initial encounter for closed fracture: Secondary | ICD-10-CM | POA: Diagnosis not present

## 2019-10-31 NOTE — Progress Notes (Signed)
Office Visit Note   Patient: [name]           Date of Birth: [birth year]           MRN: [id number] Visit Date: 10/31/2019              Requested by: [name], [address] [place],  [place] [address] PCP: [name], MD   Assessment & Plan: Visit Diagnoses:  1. Other closed fracture of right ischium, initial encounter ([place])   2. Closed fracture of right inferior pubic ramus, initial encounter ([place])   3. Unilateral primary osteoarthritis, right knee   4. Spinal stenosis of lumbar region without neurogenic claudication   5. Disc disease, degenerative, lumbar or lumbosacral   6. Spondylolisthesis of lumbar region     Plan:    Therapy daily . Call if fever or chills or increased drainage. Go to ER if acutely short of breath or call for ambulance. Return for follow up in 4 weeks. May full weight bear on the right leg In house walking for first 2 weeks.  Vitamin D 5,000 IU daily  Calcium 1500 mg per day, 200 mg per dairy portion. Pain control with tylenol and ibuprofen. Aspirin for thinning blood to prevent strokes and also to prevent blood clots in the legs.  Right pubic and ischial fractures are outside of the socket and do not involve the hip or joints. They are healing and at 3 months they should be healed. It is okay to bear weight and to walk as tolerated. HHN for PT and assistance in standing and walking. See Dr. Ninfa Linden for consideration of right TKR under a spinal anesthesia. See Dr. Ernestina Patches for consideration of a spinal cord stimulator trial for left thoracic neuralgia post CABG.  Follow-Up Instructions: Return in about 2 weeks (around 11/14/2019).   Orders:  Orders Placed This Encounter  Procedures  . XR Pelvis 1-2 Views   No orders of the defined types were placed in this encounter.     Procedures: No procedures performed   Clinical Data: No additional findings.   Subjective: Chief Complaint  Patient  presents with  . Pelvis - Follow-up  . Right Hip - Follow-up    86 year old male with history of right buttock pain with pain with sitting standing and walking. He underwent evaluation and was found to have an extraarticular fracture of the right ischium. It has been nearly 6 weeks since onset of the pain and he reports the pain is much better. He is however Experiencing pain into the right knee with pain with standing and walking. There is morning stiffness and stiffness after sitting and going to standing. He is unable to kneel on the right knee or Squat on the right knee. There is no numbness or paresthesias. He is unable to fully bend the right knee or to fully extend the right knee. He is currently on aspirin due to decrease ambulatory status but  Relates he is moving around more but his right knee is keeping him from getting around.    Review of Systems  Constitutional: Positive for activity change. Negative for appetite change, chills, diaphoresis, fatigue, fever and unexpected weight change.  HENT: Positive for hearing loss. Negative for congestion, dental problem, drooling, ear discharge, ear pain, facial swelling, mouth sores, nosebleeds, postnasal drip, rhinorrhea, sinus pressure, sinus pain, sneezing, sore throat, tinnitus and trouble swallowing.   Eyes: Negative.  Negative for photophobia, pain, discharge, redness, itching and  visual disturbance.  Respiratory: Positive for shortness of breath. Negative for apnea, cough, choking, chest tightness, wheezing and stridor.   Cardiovascular: Negative.  Negative for chest pain, palpitations and leg swelling.  Gastrointestinal: Negative.  Negative for abdominal distention, abdominal pain, anal bleeding, blood in stool, constipation, diarrhea, nausea, rectal pain and vomiting.  Endocrine: Negative.  Negative for cold intolerance, heat intolerance, polydipsia, polyphagia and polyuria.  Genitourinary: Negative for difficulty urinating, dysuria,  enuresis, flank pain, frequency and urgency.  Musculoskeletal: Positive for arthralgias, back pain, gait problem, joint swelling and neck stiffness. Negative for myalgias and neck pain.  Skin: Negative.  Negative for color change, pallor, rash and wound.  Allergic/Immunologic: Negative.  Negative for environmental allergies, food allergies and immunocompromised state.  Neurological: Positive for weakness. Negative for dizziness, tremors, seizures, syncope, facial asymmetry, speech difficulty, light-headedness, numbness and headaches.  Hematological: Negative.  Negative for adenopathy. Does not bruise/bleed easily.  Psychiatric/Behavioral: Negative.  Negative for agitation, behavioral problems, confusion, decreased concentration, dysphoric mood, hallucinations, self-injury, sleep disturbance and suicidal ideas. The patient is not nervous/anxious and is not hyperactive.      Objective: Vital Signs: BP (!) 162/87 (BP Location: Left Arm, Patient Position: Sitting)   Pulse 65   Ht 6' (1.829 m)   Wt 187 lb (84.8 kg)   BMI 25.36 kg/m   Physical Exam Constitutional:      Appearance: He is well-developed.  HENT:     Head: Normocephalic and atraumatic.  Eyes:     Pupils: Pupils are equal, round, and reactive to light.  Pulmonary:     Effort: Pulmonary effort is normal.     Breath sounds: Normal breath sounds.  Abdominal:     General: Bowel sounds are normal.     Palpations: Abdomen is soft.  Musculoskeletal:     Cervical back: Normal range of motion and neck supple.     Lumbar back: Negative right straight leg raise test and negative left straight leg raise test.     Right knee: No effusion.     Instability Tests: Medial McMurray test positive and lateral McMurray test positive.     Left knee: No effusion.     Instability Tests: Medial McMurray test negative and lateral McMurray test negative.  Skin:    General: Skin is warm and dry.  Neurological:     Mental Status: He is alert and  oriented to person, place, and time.  Psychiatric:        Behavior: Behavior normal.        Thought Content: Thought content normal.        Judgment: Judgment normal.     Right Knee Exam   Tenderness  The patient is experiencing tenderness in the medial joint line, lateral joint line, lateral retinaculum, medial retinaculum and patella.  Range of Motion  Extension:  -15 abnormal  Flexion: 110   Tests  McMurray:  Medial - positive Lateral - positive Varus: positive  Lachman:  Anterior - negative    Posterior - negative Drawer:  Anterior - negative    Posterior - negative Pivot shift: negative  Other  Erythema: absent Scars: absent Sensation: normal Pulse: present Swelling: mild Effusion: no effusion present  Comments:  Grating right knee P-F joint line.   Left Knee Exam   Muscle Strength  The patient has normal left knee strength.  Range of Motion  Extension:  -5 abnormal  Flexion:  130 abnormal   Tests  McMurray:  Medial - negative Lateral - negative  Varus: negative Valgus: negative Lachman:  Anterior - negative    Posterior - negative Drawer:  Anterior - negative     Posterior - negative Pivot shift: negative Patellar apprehension: negative  Other  Erythema: absent Scars: absent Sensation: normal Pulse: present Swelling: none Effusion: no effusion present   Back Exam   Tenderness  The patient is experiencing tenderness in the lumbar.  Range of Motion  Extension:  10 abnormal  Flexion:  70 abnormal  Lateral bend right: 60  Lateral bend left: 60  Rotation right: 60  Rotation left: 60   Muscle Strength  Right Quadriceps:  4/5  Left Quadriceps:  5/5  Right Hamstrings:  5/5  Left Hamstrings:  5/5   Tests  Straight leg raise right: negative Straight leg raise left: negative  Reflexes  Patellar: 1/4 Achilles: 0/4 Babinski's sign: normal   Other  Toe walk: normal Heel walk: normal Sensation: normal Gait: antalgic   Comments:  Hip  ROM is not painful. Right knee ROM is painful.      Specialty Comments:  No specialty comments available.  Imaging: XR Pelvis 1-2 Views  Result Date: 10/31/2019 Ap pelvis with right ischial tuberosity with some mild callus no sign of displacement seen. Right hip without acute changes no callus or lucency noted.     PMFS History: Patient Active Problem List   Diagnosis Date Noted  . At high risk for falls 05/29/2019  . Chronic bilateral low back pain without sciatica 02/07/2019  . Statin intolerance 11/16/2018  . Hyperlipidemia, mixed 08/15/2018  . Low back pain 07/18/2018  . History of lacunar cerebrovascular accident (CVA) 02/12/2018  . Dizziness 02/12/2018  . Myofascial pain 12/18/2017  . Spondylosis without myelopathy or radiculopathy, lumbar region 12/18/2017  . Lumbar radiculopathy, right 12/18/2017  . Cholelithiasis 05/17/2017  . Sinus Bradycardia 05/17/2017  . Thoracic radiculopathy 04/12/2017  . Primary osteoarthritis of right knee 12/14/2016  . DDD (degenerative disc disease), lumbar 10/10/2016  . Lumbar facet arthropathy 08/29/2016  . Idiopathic scoliosis 03/22/2016  . Diabetic neuropathy ([place]) 03/22/2016  . Dysautonomia orthostatic hypotension syndrome ([place]) 02/25/2016  . CKD stage 3 due to type 2 diabetes mellitus ([place]) 02/19/2016  . Coronary artery disease involving coronary bypass graft of native heart without angina pectoris   . Diabetes mellitus type 2, diet-controlled ([place])   . 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 Medication management 08/11/2015  . 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 PVD (peripheral vascular disease) ([place]) 01/01/2014  . Hyperlipidemia associated with type 2 diabetes mellitus ([place]) 04/11/2013  . Supine hypertension   . Vitamin D deficiency   . Vitamin B 12 deficiency   . Coronary atherosclerosis- s/p PCI to LAD in 2009 and PCI to RCA in 2011 02/27/2009  . Abdominal aortic aneurysm ([place]) 02/27/2009  . GERD 02/27/2009   Past Medical History:    Diagnosis Date  . 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 Colon polyps   . Coronary atherosclerosis of unspecified type of vessel, native or graft   . Diabetes ([place])   . Difficult intubation   . 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 Esophageal reflux   . High cholesterol   . History of IBS 02/27/2009  . Hypertension    pt denies, he says he has a h/o hypotension. If BP up he adjusts the [place]  . Jejunitis with partial SBO 05/20/2017  . Orthostatic hypotension    "BP has been dropping alot when I stand up for the last month or so" (02/17/2016)  .  Prostatitis   . Upper GI bleed 06/24/2018  . Vitamin B 12 deficiency   . Vitamin D deficiency     Family History  Problem Relation Age of Onset  . Heart attack Father        died age 40  . Heart attack Brother        died age 1  . Anuerysm Brother        aortic  . Heart attack Sister        died age 31  . Colon cancer Sister   . Liver cancer Sister   . Diabetes Maternal Grandmother   . Arthritis Mother   . Dementia Mother   . Heart Problems Brother   . Heart Problems Brother   . Heart Problems Brother   . Heart Problems Brother   . Healthy Daughter   . Healthy Son   . Esophageal cancer Neg Hx   . Inflammatory bowel disease Neg Hx   . Pancreatic cancer Neg Hx   . Stomach cancer Neg Hx     Past Surgical History:  Procedure Laterality Date  . BIOPSY  06/25/2018   Procedure: BIOPSY;  Surgeon: Rush Landmark Telford Nab., MD;  Location: [facility];  Service: Gastroenterology;;  . BIOPSY  04/24/2019   Procedure: BIOPSY;  Surgeon: Irving Copas., MD;  Location: [facility];  Service: Gastroenterology;;  . BLADDER SURGERY  1969   traumatic pelvic fractures, urethral and bladder repair  . CARDIAC CATHETERIZATION N/A 07/01/2015   Procedure: Left Heart Cath and Coronary Angiography;  Surgeon: Wellington Hampshire, MD;  Location: [facility];  Service: Cardiovascular;  Laterality: N/A;  . COLON RESECTION N/A 05/17/2017    Procedure: DIAGNOSTIC LAPAROSCOPY,;  Surgeon: Leighton Ruff, MD;  Location: [facility];  Service: General;  Laterality: N/A;  . COLONOSCOPY WITH PROPOFOL N/A 04/24/2019   Procedure: COLONOSCOPY WITH PROPOFOL;  Surgeon: Irving Copas., MD;  Location: [facility];  Service: Gastroenterology;  Laterality: N/A;  . CORONARY ANGIOPLASTY WITH STENT PLACEMENT    . CORONARY ARTERY BYPASS GRAFT N/A 07/06/2015   Procedure: CORONARY ARTERY BYPASS GRAFTING (CABG)x 4   utilizing the left internal mammary artery and endoscopically harvested bilateral  sapheneous vein.;  Surgeon: Ivin Poot, MD;  Location: [facility];  Service: Open Heart Surgery;  Laterality: N/A;  . ESOPHAGEAL DILATION  04/24/2019   Procedure: ESOPHAGEAL DILATION;  Surgeon: Rush Landmark Telford Nab., MD;  Location: [facility];  Service: Gastroenterology;;  . ESOPHAGOGASTRODUODENOSCOPY (EGD) WITH PROPOFOL N/A 06/25/2018   Procedure: ESOPHAGOGASTRODUODENOSCOPY (EGD) WITH PROPOFOL;  Surgeon: Irving Copas., MD;  Location: [facility];  Service: Gastroenterology;  Laterality: N/A;  . ESOPHAGOGASTRODUODENOSCOPY (EGD) WITH PROPOFOL N/A 04/24/2019   Procedure: ESOPHAGOGASTRODUODENOSCOPY (EGD) WITH PROPOFOL;  Surgeon: Rush Landmark Telford Nab., MD;  Location: [facility];  Service: Gastroenterology;  Laterality: N/A;  . HEMOSTASIS CLIP PLACEMENT  04/24/2019   Procedure: HEMOSTASIS CLIP PLACEMENT;  Surgeon: Irving Copas., MD;  Location: [facility];  Service: Gastroenterology;;  . KNEE SURGERY    . LOOP RECORDER INSERTION N/A 02/19/2018   Procedure: LOOP RECORDER INSERTION;  Surgeon: Deboraha Sprang, MD;  Location: [facility];  Service: Cardiovascular;  Laterality: N/A;  . POLYPECTOMY  04/24/2019   Procedure: POLYPECTOMY;  Surgeon: Rush Landmark Telford Nab., MD;  Location: [facility];  Service: Gastroenterology;;  . [place] INJECTION  04/24/2019   Procedure: SUBMUCOSAL LIFTING INJECTION;  Surgeon: Irving Copas., MD;  Location: [facility];  Service: Gastroenterology;;  . TEE WITHOUT CARDIOVERSION  N/A 07/06/2015   Procedure: TRANSESOPHAGEAL ECHOCARDIOGRAM (TEE);  Surgeon: Ivin Poot, MD;  Location: [facility];  Service: Open Heart Surgery;  Laterality: N/A;   Social History   Occupational History  . Occupation: Retired  Tobacco Use  . Smoking status: Former Smoker    Quit date: 07/16/1963    Years since quitting: 56.3  . Smokeless tobacco: Former Systems developer    Types: [place] date: 1989  Media planner  . Vaping Use: Never used  Substance and Sexual Activity  . Alcohol use: No  . Drug use: No  . Sexual activity: Not Currently

## 2019-10-31 NOTE — Patient Instructions (Signed)
In house walking for first 2 weeks.  Vitamin D 5,000 IU daily  Calcium 1500 mg per day, 200 mg per dairy portion. Pain control with tylenol and ibuprofen. Aspirin for thinning blood to prevent strokes and also to prevent blood clots in the legs.  Right pubic and ischial fractures are outside of the socket and do not involve the hip or joints. They are healing and at 3 months they should be healed. It is okay to bear weight and to walk as tolerated. HHN for PT and assistance in standing and walking. See Dr. Ninfa Linden for consideration of right TKR under a spinal anesthesia. See Dr. Ernestina Patches for consideration of a spinal cord stimulator trial for left thoracic neuralgia post CABG.

## 2019-11-06 ENCOUNTER — Telehealth: Payer: Self-pay | Admitting: Physical Medicine and Rehabilitation

## 2019-11-06 ENCOUNTER — Other Ambulatory Visit: Payer: Self-pay | Admitting: Radiology

## 2019-11-06 DIAGNOSIS — M48061 Spinal stenosis, lumbar region without neurogenic claudication: Secondary | ICD-10-CM

## 2019-11-06 DIAGNOSIS — M5137 Other intervertebral disc degeneration, lumbosacral region: Secondary | ICD-10-CM

## 2019-11-06 NOTE — Telephone Encounter (Signed)
Patient wife [name] called requesting to make an appt with Dr. Ernestina Patches. Dr. Louanne Skye recommended pt to see Dr. Ernestina Patches. Please call patient's wife or pt to set appt.

## 2019-11-06 NOTE — Telephone Encounter (Signed)
Did Dr. Louanne Skye wanted a referral to Dr. Ernestina Patches for an inj? Please Advise.

## 2019-11-06 NOTE — Telephone Encounter (Signed)
Patient's wife Estill Bamberg called asking for a return call. Dr. Romona Curls office called and left a VM to call back. Please call at [phone].

## 2019-11-06 NOTE — Telephone Encounter (Signed)
Please Advise

## 2019-11-06 NOTE — Telephone Encounter (Signed)
Called pt. Spoke wit wife, she will have pt call back to schedule appt for SCS trial.

## 2019-11-06 NOTE — Telephone Encounter (Signed)
He did state in his last OV note that he can se Dr. Ernestina Patches for consideration of SCS

## 2019-11-08 NOTE — Telephone Encounter (Signed)
Pt is scheduled for an OV to discuss SCS.

## 2019-11-12 ENCOUNTER — Institutional Professional Consult (permissible substitution): Payer: Medicare Other | Admitting: Neurology

## 2019-11-13 ENCOUNTER — Ambulatory Visit: Payer: Medicare Other | Admitting: Orthopaedic Surgery

## 2019-11-13 ENCOUNTER — Encounter: Payer: Self-pay | Admitting: Orthopaedic Surgery

## 2019-11-13 ENCOUNTER — Ambulatory Visit (INDEPENDENT_AMBULATORY_CARE_PROVIDER_SITE_OTHER): Payer: Medicare Other

## 2019-11-13 ENCOUNTER — Other Ambulatory Visit: Payer: Self-pay

## 2019-11-13 DIAGNOSIS — M25561 Pain in right knee: Secondary | ICD-10-CM

## 2019-11-13 DIAGNOSIS — M1711 Unilateral primary osteoarthritis, right knee: Secondary | ICD-10-CM | POA: Diagnosis not present

## 2019-11-13 DIAGNOSIS — G8929 Other chronic pain: Secondary | ICD-10-CM | POA: Diagnosis not present

## 2019-11-13 NOTE — Progress Notes (Signed)
Office Visit Note   Patient: [name]           Date of Birth: 02/15/1934           MRN: [id number] Visit Date: 11/13/2019              Requested by: [name], [address],  [place] [address] PCP: [name], MD   Assessment & Plan: Visit Diagnoses:  1. Chronic pain of right knee   2. Unilateral primary osteoarthritis, right knee     Plan: I did explain in detail what knee replacement surgery involves.  I showed them a knee model and went over the x-rays.  I talked about the risk and benefits of surgery in detail and explained what to expect with an intraoperative and postoperative course.  They are hoping to have this in the near future so we will look at seeing what we can do to get this on the schedule.  All questions and concerns were answered and addressed.  Follow-Up Instructions: Return for 2 weeks post-op.   Orders:  Orders Placed This Encounter  Procedures  . XR Knee 1-2 Views Right   No orders of the defined types were placed in this encounter.     Procedures: No procedures performed   Clinical Data: No additional findings.   Subjective: Chief Complaint  Patient presents with  . Right Knee - Pain  The patient is a very pleasant 86 year old gentleman who was sent from Dr. Louanne Skye to evaluate and treat severe arthritis of his right knee and to consider knee replacement surgery.  He is not on blood thinning medication.  He does not drive and mainly ambulates using a rolling walker.  His right knee pain is severe and it is daily.  It is detrimentally affecting his mobility, his quality of life and his actives daily living.  He gets daily swelling in that knee and he cannot straighten it fully.  It been going on for years but worsening the last several months.  He has tried and failed all forms of conservative treatment including activity modification and steroid injections.  His wife is with him as well.  They are both  hoping to proceed with knee replacement surgery given the debilitating pain that he is having with his right knee.  He does take aspirin twice daily which is an 81 mg aspirin.  He has stage III kidney disease.  He is diabetic but has a hemoglobin A1c that is below seven.  HPI  Review of Systems He currently denies any headache, chest pain, shortness of breath, fever, chills, nausea, vomiting  Objective: Vital Signs: There were no vitals taken for this visit.  Physical Exam He is alert and orient x3 and in no acute distress.  He ambulates very slowly and has use a walker to ambulate. Ortho Exam Examination of his right knee does show moderate effusion.  He has varus malalignment of the knee.  There is significant patellofemoral crepitation and medial joint line tenderness.  The varus malalignment is correctable.  There is a mild effusion with good range of motion of the knee. Specialty Comments:  No specialty comments available.  Imaging: No results found. Previous x-rays of the right knee show severe end-stage arthritis with particular osteophytes in all three compartments.  There is varus malalignment.  There is complete loss of the medial joint space and the patellofemoral joint.  PMFS History: Patient Active Problem List   Diagnosis Date  Noted  . Unilateral primary osteoarthritis, right knee 11/13/2019  . At high risk for falls 05/29/2019  . Chronic bilateral low back pain without sciatica 02/07/2019  . Statin intolerance 11/16/2018  . Hyperlipidemia, mixed 08/15/2018  . Low back pain 07/18/2018  . History of lacunar cerebrovascular accident (CVA) 02/12/2018  . Dizziness 02/12/2018  . Myofascial pain 12/18/2017  . Spondylosis without myelopathy or radiculopathy, lumbar region 12/18/2017  . Lumbar radiculopathy, right 12/18/2017  . Cholelithiasis 05/17/2017  . Sinus Bradycardia 05/17/2017  . Thoracic radiculopathy 04/12/2017  . Primary osteoarthritis of right knee 12/14/2016    . DDD (degenerative disc disease), lumbar 10/10/2016  . Lumbar facet arthropathy 08/29/2016  . Idiopathic scoliosis 03/22/2016  . Diabetic neuropathy ([facility]) 03/22/2016  . Dysautonomia orthostatic hypotension syndrome ([facility]) 02/25/2016  . CKD stage 3 due to type 2 diabetes mellitus ([facility]) 02/19/2016  . Coronary artery disease involving coronary bypass graft of native heart without angina pectoris   . Diabetes mellitus type 2, diet-controlled ([facility])   . 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 Medication management 08/11/2015  . 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 PVD (peripheral vascular disease) ([facility]) 01/01/2014  . Hyperlipidemia associated with type 2 diabetes mellitus ([facility]) 04/11/2013  . Supine hypertension   . Vitamin D deficiency   . Vitamin B 12 deficiency   . Coronary atherosclerosis- s/p PCI to LAD in 2009 and PCI to RCA in 2011 02/27/2009  . Abdominal aortic aneurysm ([facility]) 02/27/2009  . GERD 02/27/2009   Past Medical History:  Diagnosis Date  . 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 Colon polyps   . Coronary atherosclerosis of unspecified type of vessel, native or graft   . Diabetes ([facility])   . Difficult intubation   . 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 Esophageal reflux   . High cholesterol   . History of IBS 02/27/2009  . Hypertension    pt denies, he says he has a h/o hypotension. If BP up he adjusts the [place]  . Jejunitis with partial SBO 05/20/2017  . Orthostatic hypotension    "BP has been dropping alot when I stand up for the last month or so" (02/17/2016)  . Prostatitis   . Upper GI bleed 06/24/2018  . Vitamin B 12 deficiency   . Vitamin D deficiency     Family History  Problem Relation Age of Onset  . Heart attack Father        died age 44  . Heart attack Brother        died age 29  . Anuerysm Brother        aortic  . Heart attack Sister        died age 84  . Colon cancer Sister   . Liver cancer Sister   . Diabetes Maternal Grandmother   . Arthritis Mother   .  Dementia Mother   . Heart Problems Brother   . Heart Problems Brother   . Heart Problems Brother   . Heart Problems Brother   . Healthy Daughter   . Healthy Son   . Esophageal cancer Neg Hx   . Inflammatory bowel disease Neg Hx   . Pancreatic cancer Neg Hx   . Stomach cancer Neg Hx     Past Surgical History:  Procedure Laterality Date  . BIOPSY  06/25/2018   Procedure: BIOPSY;  Surgeon: Rush Landmark Telford Nab., MD;  Location: [facility];  Service: Gastroenterology;;  . BIOPSY  04/24/2019   Procedure: BIOPSY;  Surgeon: Justice Britain  Brooke Bonito., MD;  Location: [facility];  Service: Gastroenterology;;  . BLADDER SURGERY  1969   traumatic pelvic fractures, urethral and bladder repair  . CARDIAC CATHETERIZATION N/A 07/01/2015   Procedure: Left Heart Cath and Coronary Angiography;  Surgeon: Wellington Hampshire, MD;  Location: [facility];  Service: Cardiovascular;  Laterality: N/A;  . COLON RESECTION N/A 05/17/2017   Procedure: DIAGNOSTIC LAPAROSCOPY,;  Surgeon: Leighton Ruff, MD;  Location: [facility];  Service: General;  Laterality: N/A;  . COLONOSCOPY WITH PROPOFOL N/A 04/24/2019   Procedure: COLONOSCOPY WITH PROPOFOL;  Surgeon: Irving Copas., MD;  Location: [facility];  Service: Gastroenterology;  Laterality: N/A;  . CORONARY ANGIOPLASTY WITH STENT PLACEMENT    . CORONARY ARTERY BYPASS GRAFT N/A 07/06/2015   Procedure: CORONARY ARTERY BYPASS GRAFTING (CABG)x 4   utilizing the left internal mammary artery and endoscopically harvested bilateral  sapheneous vein.;  Surgeon: Ivin Poot, MD;  Location: [facility];  Service: Open Heart Surgery;  Laterality: N/A;  . ESOPHAGEAL DILATION  04/24/2019   Procedure: ESOPHAGEAL DILATION;  Surgeon: Rush Landmark Telford Nab., MD;  Location: [facility];  Service: Gastroenterology;;  . ESOPHAGOGASTRODUODENOSCOPY (EGD) WITH PROPOFOL N/A 06/25/2018   Procedure: ESOPHAGOGASTRODUODENOSCOPY (EGD) WITH PROPOFOL;  Surgeon: Irving Copas., MD;   Location: [facility];  Service: Gastroenterology;  Laterality: N/A;  . ESOPHAGOGASTRODUODENOSCOPY (EGD) WITH PROPOFOL N/A 04/24/2019   Procedure: ESOPHAGOGASTRODUODENOSCOPY (EGD) WITH PROPOFOL;  Surgeon: Rush Landmark Telford Nab., MD;  Location: [facility];  Service: Gastroenterology;  Laterality: N/A;  . HEMOSTASIS CLIP PLACEMENT  04/24/2019   Procedure: HEMOSTASIS CLIP PLACEMENT;  Surgeon: Irving Copas., MD;  Location: [facility];  Service: Gastroenterology;;  . KNEE SURGERY    . LOOP RECORDER INSERTION N/A 02/19/2018   Procedure: LOOP RECORDER INSERTION;  Surgeon: Deboraha Sprang, MD;  Location: [facility];  Service: Cardiovascular;  Laterality: N/A;  . POLYPECTOMY  04/24/2019   Procedure: POLYPECTOMY;  Surgeon: Rush Landmark Telford Nab., MD;  Location: [facility];  Service: Gastroenterology;;  . Lia Foyer LIFTING INJECTION  04/24/2019   Procedure: SUBMUCOSAL LIFTING INJECTION;  Surgeon: Irving Copas., MD;  Location: [facility];  Service: Gastroenterology;;  . TEE WITHOUT CARDIOVERSION N/A 07/06/2015   Procedure: TRANSESOPHAGEAL ECHOCARDIOGRAM (TEE);  Surgeon: Ivin Poot, MD;  Location: [facility];  Service: Open Heart Surgery;  Laterality: N/A;   Social History   Occupational History  . Occupation: Retired  Tobacco Use  . Smoking status: Former Smoker    Quit date: 07/16/1963    Years since quitting: 56.3  . Smokeless tobacco: Former Systems developer    Types: [place] date: 1989  Media planner  . Vaping Use: Never used  Substance and Sexual Activity  . Alcohol use: No  . Drug use: No  . Sexual activity: Not Currently

## 2019-11-15 ENCOUNTER — Encounter (HOSPITAL_COMMUNITY): Payer: Self-pay

## 2019-11-15 ENCOUNTER — Emergency Department (HOSPITAL_COMMUNITY): Payer: Medicare Other

## 2019-11-15 ENCOUNTER — Inpatient Hospital Stay (HOSPITAL_COMMUNITY)
Admission: EM | Admit: 2019-11-15 | Discharge: 2019-11-30 | DRG: 480 | Disposition: A | Discharge status: Skilled Nursing Facility | Payer: Medicare Other | Attending: Internal Medicine | Admitting: Internal Medicine

## 2019-11-15 DIAGNOSIS — I739 Peripheral vascular disease, unspecified: Secondary | ICD-10-CM | POA: Diagnosis not present

## 2019-11-15 DIAGNOSIS — Z87891 Personal history of nicotine dependence: Secondary | ICD-10-CM

## 2019-11-15 DIAGNOSIS — I5032 Chronic diastolic (congestive) heart failure: Secondary | ICD-10-CM | POA: Diagnosis present

## 2019-11-15 DIAGNOSIS — W19XXXA Unspecified fall, initial encounter: Secondary | ICD-10-CM

## 2019-11-15 DIAGNOSIS — E119 Type 2 diabetes mellitus without complications: Secondary | ICD-10-CM

## 2019-11-15 DIAGNOSIS — S066X0A Traumatic subarachnoid hemorrhage without loss of consciousness, initial encounter: Secondary | ICD-10-CM | POA: Diagnosis not present

## 2019-11-15 DIAGNOSIS — S72001A Fracture of unspecified part of neck of right femur, initial encounter for closed fracture: Secondary | ICD-10-CM | POA: Diagnosis present

## 2019-11-15 DIAGNOSIS — B9561 Methicillin susceptible Staphylococcus aureus infection as the cause of diseases classified elsewhere: Secondary | ICD-10-CM | POA: Diagnosis not present

## 2019-11-15 DIAGNOSIS — I639 Cerebral infarction, unspecified: Secondary | ICD-10-CM | POA: Diagnosis not present

## 2019-11-15 DIAGNOSIS — Z66 Do not resuscitate: Secondary | ICD-10-CM | POA: Diagnosis present

## 2019-11-15 DIAGNOSIS — I714 Abdominal aortic aneurysm, without rupture: Secondary | ICD-10-CM | POA: Diagnosis present

## 2019-11-15 DIAGNOSIS — R140 Abdominal distension (gaseous): Secondary | ICD-10-CM

## 2019-11-15 DIAGNOSIS — Z20822 Contact with and (suspected) exposure to covid-19: Secondary | ICD-10-CM | POA: Diagnosis present

## 2019-11-15 DIAGNOSIS — I248 Other forms of acute ischemic heart disease: Secondary | ICD-10-CM | POA: Diagnosis not present

## 2019-11-15 DIAGNOSIS — Z7982 Long term (current) use of aspirin: Secondary | ICD-10-CM

## 2019-11-15 DIAGNOSIS — B957 Other staphylococcus as the cause of diseases classified elsewhere: Secondary | ICD-10-CM | POA: Diagnosis not present

## 2019-11-15 DIAGNOSIS — Z8673 Personal history of transient ischemic attack (TIA), and cerebral infarction without residual deficits: Secondary | ICD-10-CM | POA: Diagnosis not present

## 2019-11-15 DIAGNOSIS — R569 Unspecified convulsions: Secondary | ICD-10-CM | POA: Diagnosis not present

## 2019-11-15 DIAGNOSIS — I6340 Cerebral infarction due to embolism of unspecified cerebral artery: Secondary | ICD-10-CM | POA: Diagnosis not present

## 2019-11-15 DIAGNOSIS — I10 Essential (primary) hypertension: Secondary | ICD-10-CM | POA: Diagnosis present

## 2019-11-15 DIAGNOSIS — F0390 Unspecified dementia without behavioral disturbance: Secondary | ICD-10-CM | POA: Diagnosis present

## 2019-11-15 DIAGNOSIS — R7881 Bacteremia: Secondary | ICD-10-CM | POA: Diagnosis not present

## 2019-11-15 DIAGNOSIS — G92 Toxic encephalopathy: Secondary | ICD-10-CM | POA: Diagnosis not present

## 2019-11-15 DIAGNOSIS — I6310 Cerebral infarction due to embolism of unspecified precerebral artery: Secondary | ICD-10-CM | POA: Diagnosis not present

## 2019-11-15 DIAGNOSIS — E1342 Other specified diabetes mellitus with diabetic polyneuropathy: Secondary | ICD-10-CM | POA: Diagnosis not present

## 2019-11-15 DIAGNOSIS — R778 Other specified abnormalities of plasma proteins: Secondary | ICD-10-CM | POA: Diagnosis not present

## 2019-11-15 DIAGNOSIS — Y92014 Private driveway to single-family (private) house as the place of occurrence of the external cause: Secondary | ICD-10-CM | POA: Diagnosis not present

## 2019-11-15 DIAGNOSIS — I130 Hypertensive heart and chronic kidney disease with heart failure and stage 1 through stage 4 chronic kidney disease, or unspecified chronic kidney disease: Secondary | ICD-10-CM | POA: Diagnosis present

## 2019-11-15 DIAGNOSIS — R4182 Altered mental status, unspecified: Secondary | ICD-10-CM | POA: Diagnosis not present

## 2019-11-15 DIAGNOSIS — E7800 Pure hypercholesterolemia, unspecified: Secondary | ICD-10-CM | POA: Diagnosis present

## 2019-11-15 DIAGNOSIS — G901 Familial dysautonomia [Riley-Day]: Secondary | ICD-10-CM | POA: Diagnosis present

## 2019-11-15 DIAGNOSIS — R0902 Hypoxemia: Secondary | ICD-10-CM

## 2019-11-15 DIAGNOSIS — K219 Gastro-esophageal reflux disease without esophagitis: Secondary | ICD-10-CM | POA: Diagnosis present

## 2019-11-15 DIAGNOSIS — N183 Chronic kidney disease, stage 3 unspecified: Secondary | ICD-10-CM | POA: Diagnosis present

## 2019-11-15 DIAGNOSIS — I951 Orthostatic hypotension: Secondary | ICD-10-CM | POA: Diagnosis present

## 2019-11-15 DIAGNOSIS — Z79899 Other long term (current) drug therapy: Secondary | ICD-10-CM

## 2019-11-15 DIAGNOSIS — S72101A Unspecified trochanteric fracture of right femur, initial encounter for closed fracture: Secondary | ICD-10-CM

## 2019-11-15 DIAGNOSIS — I2510 Atherosclerotic heart disease of native coronary artery without angina pectoris: Secondary | ICD-10-CM | POA: Diagnosis present

## 2019-11-15 DIAGNOSIS — S72141A Displaced intertrochanteric fracture of right femur, initial encounter for closed fracture: Secondary | ICD-10-CM | POA: Diagnosis present

## 2019-11-15 DIAGNOSIS — S72001G Fracture of unspecified part of neck of right femur, subsequent encounter for closed fracture with delayed healing: Secondary | ICD-10-CM | POA: Diagnosis not present

## 2019-11-15 DIAGNOSIS — G903 Multi-system degeneration of the autonomic nervous system: Secondary | ICD-10-CM | POA: Diagnosis not present

## 2019-11-15 DIAGNOSIS — I6389 Other cerebral infarction: Secondary | ICD-10-CM | POA: Diagnosis not present

## 2019-11-15 DIAGNOSIS — Z955 Presence of coronary angioplasty implant and graft: Secondary | ICD-10-CM

## 2019-11-15 DIAGNOSIS — I371 Nonrheumatic pulmonary valve insufficiency: Secondary | ICD-10-CM | POA: Diagnosis not present

## 2019-11-15 DIAGNOSIS — E1151 Type 2 diabetes mellitus with diabetic peripheral angiopathy without gangrene: Secondary | ICD-10-CM | POA: Diagnosis present

## 2019-11-15 DIAGNOSIS — I361 Nonrheumatic tricuspid (valve) insufficiency: Secondary | ICD-10-CM | POA: Diagnosis not present

## 2019-11-15 DIAGNOSIS — G9340 Encephalopathy, unspecified: Secondary | ICD-10-CM | POA: Diagnosis not present

## 2019-11-15 DIAGNOSIS — H4710 Unspecified papilledema: Secondary | ICD-10-CM | POA: Diagnosis present

## 2019-11-15 DIAGNOSIS — R1310 Dysphagia, unspecified: Secondary | ICD-10-CM | POA: Diagnosis not present

## 2019-11-15 DIAGNOSIS — I25810 Atherosclerosis of coronary artery bypass graft(s) without angina pectoris: Secondary | ICD-10-CM | POA: Diagnosis present

## 2019-11-15 DIAGNOSIS — E861 Hypovolemia: Secondary | ICD-10-CM | POA: Diagnosis not present

## 2019-11-15 DIAGNOSIS — S72001S Fracture of unspecified part of neck of right femur, sequela: Secondary | ICD-10-CM | POA: Diagnosis not present

## 2019-11-15 DIAGNOSIS — E1122 Type 2 diabetes mellitus with diabetic chronic kidney disease: Secondary | ICD-10-CM | POA: Diagnosis present

## 2019-11-15 DIAGNOSIS — N1832 Chronic kidney disease, stage 3b: Secondary | ICD-10-CM | POA: Diagnosis present

## 2019-11-15 DIAGNOSIS — Z8781 Personal history of (healed) traumatic fracture: Secondary | ICD-10-CM

## 2019-11-15 DIAGNOSIS — R0600 Dyspnea, unspecified: Secondary | ICD-10-CM

## 2019-11-15 DIAGNOSIS — N3000 Acute cystitis without hematuria: Secondary | ICD-10-CM | POA: Diagnosis not present

## 2019-11-15 DIAGNOSIS — I6349 Cerebral infarction due to embolism of other cerebral artery: Secondary | ICD-10-CM | POA: Diagnosis not present

## 2019-11-15 DIAGNOSIS — E1140 Type 2 diabetes mellitus with diabetic neuropathy, unspecified: Secondary | ICD-10-CM | POA: Diagnosis present

## 2019-11-15 DIAGNOSIS — R29810 Facial weakness: Secondary | ICD-10-CM | POA: Diagnosis present

## 2019-11-15 DIAGNOSIS — S72009A Fracture of unspecified part of neck of unspecified femur, initial encounter for closed fracture: Secondary | ICD-10-CM

## 2019-11-15 DIAGNOSIS — E785 Hyperlipidemia, unspecified: Secondary | ICD-10-CM | POA: Diagnosis present

## 2019-11-15 DIAGNOSIS — R29712 NIHSS score 12: Secondary | ICD-10-CM | POA: Diagnosis not present

## 2019-11-15 DIAGNOSIS — E1165 Type 2 diabetes mellitus with hyperglycemia: Secondary | ICD-10-CM | POA: Diagnosis present

## 2019-11-15 DIAGNOSIS — R5081 Fever presenting with conditions classified elsewhere: Secondary | ICD-10-CM | POA: Diagnosis not present

## 2019-11-15 DIAGNOSIS — D62 Acute posthemorrhagic anemia: Secondary | ICD-10-CM | POA: Diagnosis not present

## 2019-11-15 DIAGNOSIS — N179 Acute kidney failure, unspecified: Secondary | ICD-10-CM | POA: Diagnosis not present

## 2019-11-15 DIAGNOSIS — K5900 Constipation, unspecified: Secondary | ICD-10-CM | POA: Diagnosis not present

## 2019-11-15 DIAGNOSIS — Z7901 Long term (current) use of anticoagulants: Secondary | ICD-10-CM

## 2019-11-15 DIAGNOSIS — M25551 Pain in right hip: Secondary | ICD-10-CM | POA: Diagnosis present

## 2019-11-15 DIAGNOSIS — R7989 Other specified abnormal findings of blood chemistry: Secondary | ICD-10-CM

## 2019-11-15 HISTORY — DX: Cerebral infarction, unspecified: I63.9

## 2019-11-15 HISTORY — DX: Atherosclerotic heart disease of native coronary artery without angina pectoris: I25.10

## 2019-11-15 HISTORY — DX: Transient cerebral ischemic attack, unspecified: G45.9

## 2019-11-15 HISTORY — DX: Disorder of arteries and arterioles, unspecified: I77.9

## 2019-11-15 HISTORY — DX: Abdominal aortic aneurysm, without rupture: I71.4

## 2019-11-15 HISTORY — DX: Abdominal aortic aneurysm, without rupture, unspecified: I71.40

## 2019-11-15 LAB — BASIC METABOLIC PANEL
Anion gap: 8 (ref 5–15)
BUN: 23 mg/dL (ref 8–23)
CO2: 28 mmol/L (ref 22–32)
Calcium: 8.6 mg/dL — ABNORMAL LOW (ref 8.9–10.3)
Chloride: 106 mmol/L (ref 98–111)
Creatinine, Ser: 1.44 mg/dL — ABNORMAL HIGH (ref 0.61–1.24)
GFR calc Af Amer: 51 mL/min — ABNORMAL LOW (ref >60)
GFR calc non Af Amer: 44 mL/min — ABNORMAL LOW (ref >60)
Glucose, Bld: 153 mg/dL — ABNORMAL HIGH (ref 70–99)
Potassium: 3.9 mmol/L (ref 3.5–5.1)
Sodium: 142 mmol/L (ref 135–145)

## 2019-11-15 LAB — CBC WITH DIFFERENTIAL/PLATELET
Abs Immature Granulocytes: 0.04 10*3/uL (ref 0.00–0.07)
Basophils Absolute: 0.1 10*3/uL (ref 0.0–0.1)
Basophils Relative: 1 %
Eosinophils Absolute: 0.3 10*3/uL (ref 0.0–0.5)
Eosinophils Relative: 5 %
HCT: 37.3 % — ABNORMAL LOW (ref 39.0–52.0)
Hemoglobin: 11.8 g/dL — ABNORMAL LOW (ref 13.0–17.0)
Immature Granulocytes: 1 %
Lymphocytes Relative: 23 %
Lymphs Abs: 1.6 10*3/uL (ref 0.7–4.0)
MCH: 32.5 pg (ref 26.0–34.0)
MCHC: 31.6 g/dL (ref 30.0–36.0)
MCV: 102.8 fL — ABNORMAL HIGH (ref 80.0–100.0)
Monocytes Absolute: 0.4 10*3/uL (ref 0.1–1.0)
Monocytes Relative: 6 %
Neutro Abs: 4.5 10*3/uL (ref 1.7–7.7)
Neutrophils Relative %: 64 %
Platelets: 172 10*3/uL (ref 150–400)
RBC: 3.63 MIL/uL — ABNORMAL LOW (ref 4.22–5.81)
RDW: 13.8 % (ref 11.5–15.5)
WBC: 6.9 10*3/uL (ref 4.0–10.5)
nRBC: 0 % (ref 0.0–0.2)

## 2019-11-15 LAB — SARS CORONAVIRUS 2 BY RT PCR (HOSPITAL ORDER, PERFORMED IN ~~LOC~~ HOSPITAL LAB): SARS Coronavirus 2: NEGATIVE

## 2019-11-15 LAB — TYPE AND SCREEN
ABO/RH(D): B NEG
Antibody Screen: NEGATIVE

## 2019-11-15 LAB — PROTIME-INR
INR: 1.1 (ref 0.8–1.2)
Prothrombin Time: 13.6 seconds (ref 11.4–15.2)

## 2019-11-15 MED ORDER — ONDANSETRON HCL 4 MG PO TABS: 4.0000 mg | ORAL_TABLET | Freq: Four times a day (QID) | ORAL | Status: DC | PRN

## 2019-11-15 MED ORDER — PRAVASTATIN SODIUM 40 MG PO TABS
40.0000 mg | ORAL_TABLET | Freq: Every day | ORAL | Status: DC
  Administered 2019-11-16 – 2019-11-19 (×4): 40 mg via ORAL
  Filled 2019-11-15 (×4): qty 1

## 2019-11-15 MED ORDER — POLYETHYLENE GLYCOL 3350 17 G PO PACK
17.0000 g | PACK | Freq: Every day | ORAL | Status: DC
  Administered 2019-11-17 – 2019-11-26 (×10): 17 g via ORAL
  Filled 2019-11-15 (×12): qty 1

## 2019-11-15 MED ORDER — PANTOPRAZOLE SODIUM 40 MG PO TBEC
40.0000 mg | DELAYED_RELEASE_TABLET | Freq: Every day | ORAL | Status: DC
  Administered 2019-11-17 – 2019-11-30 (×14): 40 mg via ORAL
  Filled 2019-11-15 (×14): qty 1

## 2019-11-15 MED ORDER — HYDROMORPHONE HCL 1 MG/ML IJ SOLN
0.5000 mg | INTRAMUSCULAR | Status: DC | PRN
  Administered 2019-11-15 (×2): 1 mg via INTRAVENOUS
  Filled 2019-11-15 (×2): qty 1

## 2019-11-15 MED ORDER — INSULIN ASPART 100 UNIT/ML ~~LOC~~ SOLN: 0.0000 [IU] | Freq: Every day | SUBCUTANEOUS | Status: DC

## 2019-11-15 MED ORDER — DOCUSATE SODIUM 100 MG PO CAPS
100.0000 mg | ORAL_CAPSULE | Freq: Two times a day (BID) | ORAL | Status: DC
  Administered 2019-11-15: 100 mg via ORAL
  Filled 2019-11-15: qty 1

## 2019-11-15 MED ORDER — SODIUM CHLORIDE 0.9 % IV SOLN: INTRAVENOUS | Status: DC

## 2019-11-15 MED ORDER — ASPIRIN EC 81 MG PO TBEC: 162.0000 mg | DELAYED_RELEASE_TABLET | Freq: Every day | ORAL | Status: DC

## 2019-11-15 MED ORDER — PREGABALIN 100 MG PO CAPS
100.0000 mg | ORAL_CAPSULE | Freq: Three times a day (TID) | ORAL | Status: DC
  Administered 2019-11-15 – 2019-11-19 (×11): 100 mg via ORAL
  Filled 2019-11-15 (×11): qty 1

## 2019-11-15 MED ORDER — FENTANYL CITRATE (PF) 100 MCG/2ML IJ SOLN
50.0000 ug | Freq: Once | INTRAMUSCULAR | Status: AC
  Administered 2019-11-15: 50 ug via INTRAVENOUS
  Filled 2019-11-15: qty 2

## 2019-11-15 MED ORDER — FLUDROCORTISONE ACETATE 0.1 MG PO TABS
0.1000 mg | ORAL_TABLET | Freq: Every day | ORAL | Status: DC
  Administered 2019-11-17 – 2019-11-30 (×14): 0.1 mg via ORAL
  Filled 2019-11-15 (×16): qty 1

## 2019-11-15 MED ORDER — INSULIN ASPART 100 UNIT/ML ~~LOC~~ SOLN
0.0000 [IU] | Freq: Three times a day (TID) | SUBCUTANEOUS | Status: DC
  Administered 2019-11-16: 3 [IU] via SUBCUTANEOUS
  Administered 2019-11-17 (×3): 1 [IU] via SUBCUTANEOUS

## 2019-11-15 MED ORDER — ONDANSETRON HCL 4 MG/2ML IJ SOLN: 4.0000 mg | Freq: Four times a day (QID) | INTRAMUSCULAR | Status: DC | PRN

## 2019-11-15 NOTE — ED Provider Notes (Signed)
[facility] EMERGENCY DEPARTMENT Provider Note   CSN: [id number] Arrival date & time: 11/15/19  1355     History Chief Complaint  Patient presents with  . Hip Pain    EVYN PUTZIER is a 86 y.o. male with a history of CAD, prior GI bleed, IBS, hypertension, hypercholesterolemia, & CKD who presents to the ED with complaints of R hip pain S/p fall shortly PTA. Patient states he was getting out of the car to urinate with the assistance of his wife when he miss-stepped and fell. He denies head injury or LOC. Fell onto this right side. Unable to get up. Having pain to the R hip that is constant, 4/10 in severity at rest, much worse with movement. No alleviating factors. Denies any other areas of pain. Denies numbness, tingling, weakness, or open wounds. Has not required surgery on this hip previously.  Patient sees Dr. Ninfa Linden and Dr. Louanne Skye, for his orthopedic needs. Last PO intake 12PM today- pizza.   HPI     Past Medical History:  Diagnosis Date  . 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 Colon polyps   . Coronary atherosclerosis of unspecified type of vessel, native or graft   . Diabetes ([place])   . Difficult intubation   . 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 Esophageal reflux   . High cholesterol   . History of IBS 02/27/2009  . Hypertension    pt denies, he says he has a h/o hypotension. If BP up he adjusts the [place]  . Jejunitis with partial SBO 05/20/2017  . Orthostatic hypotension    "BP has been dropping alot when I stand up for the last month or so" (02/17/2016)  . Prostatitis   . Upper GI bleed 06/24/2018  . Vitamin B 12 deficiency   . Vitamin D deficiency     Patient Active Problem List   Diagnosis Date Noted  . Unilateral primary osteoarthritis, right knee 11/13/2019  . At high risk for falls 05/29/2019  . Chronic bilateral low back pain without sciatica 02/07/2019  . Statin intolerance 11/16/2018  . Hyperlipidemia, mixed 08/15/2018  . Low back pain 07/18/2018    . History of lacunar cerebrovascular accident (CVA) 02/12/2018  . Dizziness 02/12/2018  . Myofascial pain 12/18/2017  . Spondylosis without myelopathy or radiculopathy, lumbar region 12/18/2017  . Lumbar radiculopathy, right 12/18/2017  . Cholelithiasis 05/17/2017  . Sinus Bradycardia 05/17/2017  . Thoracic radiculopathy 04/12/2017  . Primary osteoarthritis of right knee 12/14/2016  . DDD (degenerative disc disease), lumbar 10/10/2016  . Lumbar facet arthropathy 08/29/2016  . Idiopathic scoliosis 03/22/2016  . Diabetic neuropathy ([place]) 03/22/2016  . Dysautonomia orthostatic hypotension syndrome ([place]) 02/25/2016  . CKD stage 3 due to type 2 diabetes mellitus ([place]) 02/19/2016  . Coronary artery disease involving coronary bypass graft of native heart without angina pectoris   . Diabetes mellitus type 2, diet-controlled ([place])   . 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 Medication management 08/11/2015  . 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 PVD (peripheral vascular disease) ([place]) 01/01/2014  . Hyperlipidemia associated with type 2 diabetes mellitus ([place]) 04/11/2013  . Supine hypertension   . Vitamin D deficiency   . Vitamin B 12 deficiency   . Coronary atherosclerosis- s/p PCI to LAD in 2009 and PCI to RCA in 2011 02/27/2009  . Abdominal aortic aneurysm ([place]) 02/27/2009  . GERD 02/27/2009    Past Surgical History:  Procedure Laterality Date  . BIOPSY  06/25/2018   Procedure: BIOPSY;  Surgeon:  Mansouraty, Telford Nab., MD;  Location: [facility];  Service: Gastroenterology;;  . BIOPSY  04/24/2019   Procedure: BIOPSY;  Surgeon: Irving Copas., MD;  Location: [facility];  Service: Gastroenterology;;  . BLADDER SURGERY  1969   traumatic pelvic fractures, urethral and bladder repair  . CARDIAC CATHETERIZATION N/A 07/01/2015   Procedure: Left Heart Cath and Coronary Angiography;  Surgeon: Wellington Hampshire, MD;  Location: [facility];  Service: Cardiovascular;  Laterality: N/A;  .  COLON RESECTION N/A 05/17/2017   Procedure: DIAGNOSTIC LAPAROSCOPY,;  Surgeon: Leighton Ruff, MD;  Location: [facility];  Service: General;  Laterality: N/A;  . COLONOSCOPY WITH PROPOFOL N/A 04/24/2019   Procedure: COLONOSCOPY WITH PROPOFOL;  Surgeon: Irving Copas., MD;  Location: [facility];  Service: Gastroenterology;  Laterality: N/A;  . CORONARY ANGIOPLASTY WITH STENT PLACEMENT    . CORONARY ARTERY BYPASS GRAFT N/A 07/06/2015   Procedure: CORONARY ARTERY BYPASS GRAFTING (CABG)x 4   utilizing the left internal mammary artery and endoscopically harvested bilateral  sapheneous vein.;  Surgeon: Ivin Poot, MD;  Location: [place];  Service: Open Heart Surgery;  Laterality: N/A;  . ESOPHAGEAL DILATION  04/24/2019   Procedure: ESOPHAGEAL DILATION;  Surgeon: Rush Landmark Telford Nab., MD;  Location: [facility];  Service: Gastroenterology;;  . ESOPHAGOGASTRODUODENOSCOPY (EGD) WITH PROPOFOL N/A 06/25/2018   Procedure: ESOPHAGOGASTRODUODENOSCOPY (EGD) WITH PROPOFOL;  Surgeon: Irving Copas., MD;  Location: [facility];  Service: Gastroenterology;  Laterality: N/A;  . ESOPHAGOGASTRODUODENOSCOPY (EGD) WITH PROPOFOL N/A 04/24/2019   Procedure: ESOPHAGOGASTRODUODENOSCOPY (EGD) WITH PROPOFOL;  Surgeon: Rush Landmark Telford Nab., MD;  Location: [facility];  Service: Gastroenterology;  Laterality: N/A;  . HEMOSTASIS CLIP PLACEMENT  04/24/2019   Procedure: HEMOSTASIS CLIP PLACEMENT;  Surgeon: Irving Copas., MD;  Location: [facility];  Service: Gastroenterology;;  . KNEE SURGERY    . LOOP RECORDER INSERTION N/A 02/19/2018   Procedure: LOOP RECORDER INSERTION;  Surgeon: Deboraha Sprang, MD;  Location: [facility];  Service: Cardiovascular;  Laterality: N/A;  . POLYPECTOMY  04/24/2019   Procedure: POLYPECTOMY;  Surgeon: Rush Landmark Telford Nab., MD;  Location: [facility];  Service: Gastroenterology;;  . Lia Foyer LIFTING INJECTION  04/24/2019   Procedure: SUBMUCOSAL LIFTING  INJECTION;  Surgeon: Irving Copas., MD;  [facility];  Service: Gastroenterology;;  . TEE WITHOUT CARDIOVERSION N/A 07/06/2015   Procedure: TRANSESOPHAGEAL ECHOCARDIOGRAM (TEE);  Surgeon: Ivin Poot, MD;  Location: [facility];  Service: Open Heart Surgery;  Laterality: N/A;       Family History  Problem Relation Age of Onset  . Heart attack Father        died age 70  . Heart attack Brother        died age 69  . Anuerysm Brother        aortic  . Heart attack Sister        died age 43  . Colon cancer Sister   . Liver cancer Sister   . Diabetes Maternal Grandmother   . Arthritis Mother   . Dementia Mother   . Heart Problems Brother   . Heart Problems Brother   . Heart Problems Brother   . Heart Problems Brother   . Healthy Daughter   . Healthy Son   . Esophageal cancer Neg Hx   . Inflammatory bowel disease Neg Hx   . Pancreatic cancer Neg Hx   . Stomach cancer Neg Hx     Social History   Tobacco Use  . Smoking status:  Former Smoker    Quit date: 07/16/1963    Years since quitting: 56.3  . Smokeless tobacco: Former Systems developer    Types: [place] date: 1989  Media planner  . Vaping Use: Never used  Substance Use Topics  . Alcohol use: No  . Drug use: No    Home Medications Prior to Admission medications   Medication Sig Start Date End Date Taking? Authorizing Provider  acetaminophen (TYLENOL) 500 MG tablet Take 1,000 mg by mouth 2 (two) times daily as needed for headache (pain).     [provider]  amLODipine (NORVASC) 10 MG tablet Take 1/2 to 1 tablet daily for BP Patient taking differently: Take 10 mg by mouth daily.  07/20/19   [name], MD  Ascorbic Acid (VITAMIN C) 500 MG CAPS Take 500 mg by mouth daily.     [provider]  aspirin EC 81 MG tablet Take 2 tablets (162 mg total) by mouth daily. 10/03/19   Garvin Fila, MD  Calcium Carb-Cholecalciferol (CALCIUM+D3 PO) Take 1 tablet by mouth daily.    [provider]  Cholecalciferol (VITAMIN D-3) 5000 units TABS Take 5,000 Units by mouth daily.    [provider]  fludrocortisone (FLORINEF) 0.1 MG tablet Take 2 tablets Daily for Low BP 08/20/19   [name], MD  glucose blood test strip Check blood sugar 1 time daily-[id number] 07/07/17   [name], MD  Multiple Vitamin (MULTIVITAMIN WITH MINERALS) TABS tablet Take 1 tablet by mouth daily.    [provider]  olmesartan (BENICAR) 40 MG tablet Take 40 mg by mouth daily.    [provider]  Omega-3 Fatty Acids (FISH OIL OMEGA-3 PO) Take 1 capsule by mouth daily.    [provider]  pantoprazole (PROTONIX) 40 MG tablet Take 1 tablet Daily for Indigestion & Heartburn Patient taking differently: Take 40 mg by mouth daily.  03/11/19   [name], MD  polyethylene glycol [facility] / Floria Raveling) packet Take 17 g by mouth daily. 05/22/17   Barton Dubois, MD  pravastatin (PRAVACHOL) 40 MG tablet Take 1 tablet at Bedtime for Cholesterol 10/16/19   [name], MD  pregabalin (LYRICA) 100 MG capsule Take 1 capsule     3 x /day for Nerve Pains 08/29/19   [name], MD  Zinc 50 MG TABS Take 1 tablet by mouth daily.    [provider]    Allergies    Other, Cymbalta [duloxetine hcl], Keflex [cephalexin], Simvastatin, and Sudafed [pseudoephedrine]  Review of Systems   Review of Systems  Constitutional: Negative for chills and fever.  Eyes: Negative for visual disturbance.  Respiratory: Negative for cough and shortness of breath.   Cardiovascular: Negative for chest pain.  Gastrointestinal: Negative for abdominal pain and vomiting.  Musculoskeletal: Positive for arthralgias. Negative for back pain and neck pain.  Skin: Negative for wound.  Neurological: Negative for seizures, syncope, weakness, numbness and headaches.  All other systems reviewed and are negative.   Physical Exam Updated Vital Signs BP (!) 141/63 (BP Location: Left Arm)   Pulse  68   Temp 98.2 F (36.8 C) (Oral)   Resp 12   Ht 6' (1.829 m)   Wt 81.6 kg   SpO2 92%   BMI 24.41 kg/m   Physical Exam Vitals and nursing note reviewed.  Constitutional:      General: He is not in acute distress.    Appearance: He is well-developed. He is not toxic-appearing.  HENT:     Head: Normocephalic and atraumatic.     Comments: No raccoon eyes or battle sign. Eyes:     General:        Right eye: No discharge.        Left eye: No discharge.     Extraocular Movements: Extraocular movements intact.     Conjunctiva/sclera: Conjunctivae normal.     Pupils: Pupils are equal, round, and reactive to light.  Cardiovascular:     Rate and Rhythm: Normal rate and regular rhythm.     Comments: 2+ symmetric DP and PT pulses bilaterally. Pulmonary:     Effort: Pulmonary effort is normal. No respiratory distress.     Breath sounds: Normal breath sounds. No wheezing, rhonchi or rales.  Chest:     Chest wall: No tenderness.  Abdominal:     General: There is no distension.     Palpations: Abdomen is soft.     Tenderness: There is no abdominal tenderness. There is no guarding or rebound.  Musculoskeletal:     Cervical back: Normal range of motion and neck supple. No tenderness.     Comments: Upper extremities: No focal bony tenderness Back: No midline tenderness Lower extremities: Patient with right lower extremity which appears slightly shortened and externally rotated.  No significant open wounds, erythema, or ecchymosis.  Patient has intact active range of motion throughout the left lower extremity as well as with the right ankle and toes.  Right knee and hip range of motion significantly limited secondary to pain in the right hip per patient.  Patient is tender along the right lateral hip/greater trochanter.  Otherwise nontender.  Skin:    General: Skin is warm and dry.     Findings: No rash.  Neurological:     Mental Status: He is alert.     Comments: Clear speech.  CN III  through XII grossly intact.  Sensation grossly intact bilateral upper and lower extremities.  5 out of 5 strength with plantar dorsiflexion bilaterally.  Psychiatric:        Behavior: Behavior normal.     ED Results / Procedures / Treatments   Labs (all labs ordered are listed, but only abnormal results are displayed) Labs Reviewed  BASIC METABOLIC PANEL - Abnormal; Notable for the following components:      Result Value   Glucose, Bld 153 (*)    Creatinine, Ser 1.44 (*)    Calcium 8.6 (*)    GFR calc non Af Amer 44 (*)    GFR calc Af Amer 51 (*)    All other components within normal limits  CBC WITH DIFFERENTIAL/PLATELET - Abnormal; Notable for the following components:   RBC 3.63 (*)    Hemoglobin 11.8 (*)    HCT 37.3 (*)    MCV 102.8 (*)    All other components within normal limits  SARS CORONAVIRUS 2 BY RT PCR (HOSPITAL ORDER, [place] [facility])  PROTIME-INR  TYPE AND SCREEN    EKG None  Radiology DG Hip Unilat With Pelvis 2-3 Views Right  Result Date: 11/15/2019 CLINICAL DATA:  Fall, right hip pain EXAM: DG HIP (WITH OR WITHOUT PELVIS) 2-3V RIGHT COMPARISON:  None. FINDINGS: Comminuted intertrochanteric right hip fracture seen with mild Verus angulation and avulsion of the lesser trochanter. Femoral head is still seated within the right acetabulum. Limited evaluation of the left hip is unremarkable. The pelvis and sacrum appear intact. IMPRESSION: Comminuted intertrochanteric right hip fracture. Electronically Signed  By: Fidela Salisbury MD   On: 11/15/2019 15:35    Procedures Procedures (including critical care time)  Medications Ordered in ED Medications - No data to display  ED Course  I have reviewed the triage vital signs and the nursing notes.  Pertinent labs & imaging results that were available during my care of the patient were reviewed by me and considered in my medical decision making (see chart for details).  Bonny Egger Alvelo was  evaluated in Emergency Department on 11/15/2019 for the symptoms described in the history of present illness. He/she was evaluated in the context of the global COVID-19 pandemic, which necessitated consideration that the patient might be at risk for infection with the SARS-CoV-2 virus that causes COVID-19. Institutional protocols and algorithms that pertain to the evaluation of patients at risk for COVID-19 are in a state of rapid change based on information released by regulatory bodies including the CDC and federal and state organizations. These policies and algorithms were followed during the patient's care in the ED.    MDM Rules/Calculators/A&P                         Patient presents to the ED with complaints of right hip pain status post mechanical fall.  Patient nontoxic, resting comfortably, vitals without significant abnormality.  No signs of serious head, neck, back, or intrathoracic/abdominal injury given no spinal or chest/abdominal tenderness & no focal neuro deficits.  Patient seems to have isolated injury to the right hip, there is some shortening with external rotation, tenderness to palpation laterally.  Neurovascularly intact distally.  Fentanyl ordered for pain.  Will obtain right hip/pelvic x-ray.  Additional history obtained:  Additional history obtained from patient's wife @ bedside-patient sees Dr. Ninfa Linden  & Dr. Louanne Skye with [facility] for his orthopedics needs. previous records obtained and reviewed.   Imaging Studies ordered:  I ordered imaging studies which included R hip/pelvis x-ray, I independently visualized and interpreted imaging which showed Comminuted intertrochanteric right hip fracture.  15:49: RE-EVAL: Patient with good pain control. Remains NVI distally to fracture. Will discuss w/ [facility] on call as patient & his wife would prefer to stick with this group.   Lab Tests:  I Ordered, reviewed, and interpreted labs, which included:  CBC: Mild anemia.  BMP: Renal  function similar to prior.  PT/INR: WNL COVID testing: Pending  16:34: CONSULT: Discussed with Annie Main PA-C with [facility]- will review chart & discuss with team with call back  17:10: CONSULT: Re-discussed with PA Magnant- plan for OR @ [place] to hospitalist service. Patient & his wife would prefer Dr. Ninfa Linden if possible- this was related to orthopedics team.  17:29: CONSULT: Discussed with hospitalist Dr. Nehemiah Settle- accepts admission.   Portions of this note were generated with Lobbyist. Dictation errors may occur despite best attempts at proofreading.  Final Clinical Impression(s) / ED Diagnoses Final diagnoses:  Fall, initial encounter  Closed fracture of trochanter of right femur, initial encounter [facility])    Rx / DC Orders ED Discharge Orders    None       Amaryllis Dyke, PA-C 11/15/19 1759    Milton Ferguson, MD 11/16/19 1755

## 2019-11-15 NOTE — ED Triage Notes (Signed)
Pt fell getting out of car. He is complaining of right posterior hip pain. External rotation noted to right foot, which is normal for patient due to a previous hip fracture.

## 2019-11-15 NOTE — H&P (Signed)
History and Physical  [name] HWE:[id number] DOB: [birth year] DOA: 11/15/2019  Referring physician: Marylou Mccoy, PA-C, EDP PCP: [name], MD  Outpatient Specialists:   Patient Coming From: home  Chief Complaint: right hip pain  HPI: [name] is a 86 y.o. male with a history of diet-controlled type 2 diabetes with stage III chronic kidney disease and diabetic neuropathy, coronary artery disease status post bypass, CVA.  Patient presents with right hip pain after a mechanical fall with the patient tripping as he was trying to get out of the car.  Pain started immediately after the fall and is worse with movement and improved with rest.  EMS was called and the patient was brought to the emergency department for evaluation.    Emergency Department Course: X-rays show a right intertrochanteric hip fracture  Review of Systems:   Pt denies any fevers, chills, nausea, vomiting, diarrhea, constipation, abdominal pain, shortness of breath, dyspnea on exertion, orthopnea, cough, wheezing, palpitations, headache, vision changes, lightheadedness, dizziness, melena, rectal bleeding.  Review of systems are otherwise negative  Past Medical History:  Diagnosis Date  . 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 Colon polyps   . Coronary atherosclerosis of unspecified type of vessel, native or graft   . Diabetes ([place])   . Difficult intubation   . 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 Esophageal reflux   . High cholesterol   . History of IBS 02/27/2009  . Hypertension    pt denies, he says he has a h/o hypotension. If BP up he adjusts the Florinef  . Jejunitis with partial SBO 05/20/2017  . Orthostatic hypotension    "BP has been dropping alot when I stand up for the last month or so" (02/17/2016)  . Prostatitis   . Upper GI bleed 06/24/2018  . Vitamin B 12 deficiency   . Vitamin D deficiency    Past Surgical History:  Procedure Laterality Date  . BIOPSY  06/25/2018    Procedure: BIOPSY;  Surgeon: Rush Landmark Telford Nab., MD;  Location: [facility];  Service: Gastroenterology;;  . BIOPSY  04/24/2019   Procedure: BIOPSY;  Surgeon: Irving Copas., MD;  Location: [facility];  Service: Gastroenterology;;  . BLADDER SURGERY  1969   traumatic pelvic fractures, urethral and bladder repair  . CARDIAC CATHETERIZATION N/A 07/01/2015   Procedure: Left Heart Cath and Coronary Angiography;  Surgeon: Wellington Hampshire, MD;  Location: [facility];  Service: Cardiovascular;  Laterality: N/A;  . COLON RESECTION N/A 05/17/2017   Procedure: DIAGNOSTIC LAPAROSCOPY,;  Surgeon: Leighton Ruff, MD;  Location: [facility];  Service: General;  Laterality: N/A;  . COLONOSCOPY WITH PROPOFOL N/A 04/24/2019   Procedure: COLONOSCOPY WITH PROPOFOL;  Surgeon: Irving Copas., MD;  Location: [facility];  Service: Gastroenterology;  Laterality: N/A;  . CORONARY ANGIOPLASTY WITH STENT PLACEMENT    . CORONARY ARTERY BYPASS GRAFT N/A 07/06/2015   Procedure: CORONARY ARTERY BYPASS GRAFTING (CABG)x 4   utilizing the left internal mammary artery and endoscopically harvested bilateral  sapheneous vein.;  Surgeon: Ivin Poot, MD;  Location: [facility];  Service: Open Heart Surgery;  Laterality: N/A;  . ESOPHAGEAL DILATION  04/24/2019   Procedure: ESOPHAGEAL DILATION;  Surgeon: Rush Landmark Telford Nab., MD;  Location: [facility];  Service: Gastroenterology;;  . ESOPHAGOGASTRODUODENOSCOPY (EGD) WITH PROPOFOL N/A 06/25/2018   Procedure: ESOPHAGOGASTRODUODENOSCOPY (EGD) WITH PROPOFOL;  Surgeon: Irving Copas., MD;  Location: [facility];  Service: Gastroenterology;  Laterality: N/A;  . ESOPHAGOGASTRODUODENOSCOPY (EGD) WITH  PROPOFOL N/A 04/24/2019   Procedure: ESOPHAGOGASTRODUODENOSCOPY (EGD) WITH PROPOFOL;  Surgeon: Rush Landmark Telford Nab., MD;  Location: [facility];  Service: Gastroenterology;  Laterality: N/A;  . HEMOSTASIS CLIP PLACEMENT  04/24/2019   Procedure: HEMOSTASIS  CLIP PLACEMENT;  Surgeon: Irving Copas., MD;  Location: [facility];  Service: Gastroenterology;;  . KNEE SURGERY    . LOOP RECORDER INSERTION N/A 02/19/2018   Procedure: LOOP RECORDER INSERTION;  Surgeon: Deboraha Sprang, MD;  Location: [facility];  Service: Cardiovascular;  Laterality: N/A;  . POLYPECTOMY  04/24/2019   Procedure: POLYPECTOMY;  Surgeon: Rush Landmark Telford Nab., MD;  Location: [facility];  Service: Gastroenterology;;  . Lia Foyer LIFTING INJECTION  04/24/2019   Procedure: SUBMUCOSAL LIFTING INJECTION;  Surgeon: Irving Copas., MD;  Location: [facility];  Service: Gastroenterology;;  . TEE WITHOUT CARDIOVERSION N/A 07/06/2015   Procedure: TRANSESOPHAGEAL ECHOCARDIOGRAM (TEE);  Surgeon: Ivin Poot, MD;  Location: [facility];  Service: Open Heart Surgery;  Laterality: N/A;   Social History:  reports that he quit smoking about 56 years ago. He quit smokeless tobacco use about 32 years ago.  His smokeless tobacco use included chew. He reports that he does not drink alcohol and does not use drugs. Patient lives at home  Allergies  Allergen Reactions  . Other Other (See Comments)    Pt reports allergic to a medication but unsure of name or type of med  . Cymbalta [Duloxetine Hcl] Other (See Comments)    Dizziness, hallucinations.   . Keflex [Cephalexin] Other (See Comments)    dizziness  . Simvastatin Other (See Comments)    Joint pain  . Sudafed [Pseudoephedrine] Other (See Comments)    Dizziness    Family History  Problem Relation Age of Onset  . Heart attack Father        died age 77  . Heart attack Brother        died age 4  . Anuerysm Brother        aortic  . Heart attack Sister        died age 86  . Colon cancer Sister   . Liver cancer Sister   . Diabetes Maternal Grandmother   . Arthritis Mother   . Dementia Mother   . Heart Problems Brother   . Heart Problems Brother   . Heart Problems Brother   . Heart Problems Brother     . Healthy Daughter   . Healthy Son   . Esophageal cancer Neg Hx   . Inflammatory bowel disease Neg Hx   . Pancreatic cancer Neg Hx   . Stomach cancer Neg Hx       Prior to Admission medications   Medication Sig Start Date End Date Taking? Authorizing Provider  aspirin EC 81 MG tablet Take 2 tablets (162 mg total) by mouth daily. 10/03/19  Yes Garvin Fila, MD  fludrocortisone (FLORINEF) 0.1 MG tablet Take 2 tablets Daily for Low BP Patient taking differently: Take 0.1 mg by mouth daily. Daily for Low BP 08/20/19  Yes [name], MD  olmesartan (BENICAR) 40 MG tablet Take 20 mg by mouth daily as needed (for elevated BP).    Yes [provider]  pantoprazole (PROTONIX) 40 MG tablet Take 1 tablet Daily for Indigestion & Heartburn Patient taking differently: Take 40 mg by mouth daily.  03/11/19  Yes [name], MD  pregabalin (LYRICA) 100 MG capsule Take 100 mg by mouth 3 (three) times daily.   Yes [provider]  acetaminophen (TYLENOL) 500 MG tablet Take 1,000 mg by mouth 2 (two) times daily as needed for headache (pain).     [provider]  Ascorbic Acid (VITAMIN C) 500 MG CAPS Take 500 mg by mouth daily.     [provider]  Calcium Carb-Cholecalciferol (CALCIUM+D3 PO) Take 1 tablet by mouth daily.    [provider]  Cholecalciferol (VITAMIN D-3) 5000 units TABS Take 5,000 Units by mouth daily.    [provider]  glucose blood test strip Check blood sugar 1 time daily-[id number] 07/07/17   [name], MD  Multiple Vitamin (MULTIVITAMIN WITH MINERALS) TABS tablet Take 1 tablet by mouth daily.    [provider]  Omega-3 Fatty Acids (FISH OIL OMEGA-3 PO) Take 1 capsule by mouth daily.    [provider]  polyethylene glycol (MIRALAX / GLYCOLAX) packet Take 17 g by mouth daily. 05/22/17   Barton Dubois, MD  pravastatin (PRAVACHOL) 40 MG tablet Take 1 tablet at Bedtime for Cholesterol 10/16/19   [name], MD  Zinc 50 MG TABS Take 1 tablet by mouth daily.    [provider]    Physical Exam: BP (!) 171/91   Pulse 82   Temp 98.2 F (36.8 C) (Oral)   Resp 20   Ht 6' (1.829 m)   Wt 81.6 kg   SpO2 99%   BMI 24.41 kg/m   . General: Elderly male. Awake and alert and oriented x3. No acute cardiopulmonary distress.  Marland Kitchen HEENT: Normocephalic atraumatic.  Right and left ears normal in appearance.  Pupils equal, round, reactive to light. Extraocular muscles are intact. Sclerae anicteric and noninjected.  Moist mucosal membranes. No mucosal lesions.  . Neck: Neck supple without lymphadenopathy. No carotid bruits. No masses palpated.  . Cardiovascular: Regular rate with normal S1-S2 sounds. No murmurs, rubs, gallops auscultated. No JVD.  Marland Kitchen Respiratory: Good respiratory effort with no wheezes, rales, rhonchi. Lungs clear to auscultation bilaterally.  No accessory muscle use. . Abdomen: Soft, nontender, nondistended. Active bowel sounds. No masses or hepatosplenomegaly  . Skin: No rashes, lesions, or ulcerations.  Dry, warm to touch. 2+ dorsalis pedis and radial pulses. . Musculoskeletal: Right leg shortened and externally rotated.  Neurovascularly intact distal.  No contractures  . Psychiatric: Intact judgment and insight. Pleasant and cooperative. . Neurologic: No focal neurological deficits. Strength is 5/5 and symmetric in upper and lower extremities.  Cranial nerves II through XII are grossly intact.           Labs on Admission: I have personally reviewed following labs and imaging studies  CBC: Recent Labs  Lab 11/15/19 1542  WBC 6.9  NEUTROABS 4.5  HGB 11.8*  HCT 37.3*  MCV 102.8*  PLT 809   Basic Metabolic Panel: Recent Labs  Lab 11/15/19 1542  NA 142  K 3.9  CL 106  CO2 28  GLUCOSE 153*  BUN 23  CREATININE 1.44*  CALCIUM 8.6*   GFR: Estimated Creatinine Clearance: 41.9 mL/min (A) (by C-G formula based on SCr of 1.44 mg/dL (H)). Liver Function  Tests: No results for input(s): AST, ALT, ALKPHOS, BILITOT, PROT, ALBUMIN in the last 168 hours. No results for input(s): LIPASE, AMYLASE in the last 168 hours. No results for input(s): AMMONIA in the last 168 hours. Coagulation Profile: Recent Labs  Lab 11/15/19 1542  INR 1.1   Cardiac Enzymes: No results for input(s): CKTOTAL, CKMB, CKMBINDEX, TROPONINI in the last 168 hours. BNP (last 3 results) No results  for input(s): PROBNP in the last 8760 hours. HbA1C: No results for input(s): HGBA1C in the last 72 hours. CBG: No results for input(s): GLUCAP in the last 168 hours. Lipid Profile: No results for input(s): CHOL, HDL, LDLCALC, TRIG, CHOLHDL, LDLDIRECT in the last 72 hours. Thyroid Function Tests: No results for input(s): TSH, T4TOTAL, FREET4, T3FREE, THYROIDAB in the last 72 hours. Anemia Panel: No results for input(s): VITAMINB12, FOLATE, FERRITIN, TIBC, IRON, RETICCTPCT in the last 72 hours. Urine analysis:    Component Value Date/Time   COLORURINE YELLOW 09/11/2019 [place] 09/11/2019 1652   LABSPEC 1.020 09/11/2019 1652   PHURINE 5.0 09/11/2019 1652   GLUCOSEU NEGATIVE 09/11/2019 1652   HGBUR NEGATIVE 09/11/2019 1652   BILIRUBINUR NEGATIVE 09/11/2019 1652   KETONESUR NEGATIVE 09/11/2019 1652   PROTEINUR NEGATIVE 09/11/2019 1652   UROBILINOGEN 1.0 01/05/2015 1550   NITRITE NEGATIVE 09/11/2019 1652   LEUKOCYTESUR NEGATIVE 09/11/2019 1652   Sepsis Labs: @LABRCNTIP (procalcitonin:4,lacticidven:4) )No results found for this or any previous visit (from the past 240 hour(s)).   Radiological Exams on Admission: DG Hip Unilat With Pelvis 2-3 Views Right  Result Date: 11/15/2019 CLINICAL DATA:  Fall, right hip pain EXAM: DG HIP (WITH OR WITHOUT PELVIS) 2-3V RIGHT COMPARISON:  None. FINDINGS: Comminuted intertrochanteric right hip fracture seen with mild Verus angulation and avulsion of the lesser trochanter. Femoral head is still seated within the right  acetabulum. Limited evaluation of the left hip is unremarkable. The pelvis and sacrum appear intact. IMPRESSION: Comminuted intertrochanteric right hip fracture. Electronically Signed   By: Fidela Salisbury MD   On: 11/15/2019 15:35    EKG: Independently reviewed.  Sinus rhythm, prolonged PR interval.  No acute ST changes  Assessment/Plan: Principal Problem:   Closed right hip fracture (HCC) Active Problems:   GERD   Supine hypertension   PVD (peripheral vascular disease) (HCC)   Coronary artery disease involving coronary bypass graft of native heart without angina pectoris   Diabetes mellitus type 2, diet-controlled ([place])   CKD stage 3 due to type 2 diabetes mellitus (HCC)   Dysautonomia orthostatic hypotension syndrome (HCC)   Diabetic neuropathy ([place])   History of lacunar cerebrovascular accident (CVA)    This patient was discussed with the ED physician, including pertinent vitals, physical exam findings, labs, and imaging.  We also discussed care given by the ED provider.  1. Closed right hip fracture a. Admit to [facility] b. N.p.o. after midnight c. Pain control d. CBC in the morning e. SCDs for DVT prophylaxis 2. Diabetes with neuropathy and peripheral vascular disease a. Patient diet controlled b. Sliding scale insulin and CBGs 3. CKD stage III a. Stable b. Check creatinine 4. Dysautonomia orthostatic hypotension syndrome a. Continue fludrocortisone 5. History of lacunar CVA 6. Coronary artery disease a. Continue statin, aspirin  DVT prophylaxis: SCDs Consultants: Ortho Code Status: DNR confirmed by patient Family Communication: Wife present Disposition Plan: Patient will likely need rehab   Truett Mainland, DO

## 2019-11-15 NOTE — Plan of Care (Signed)
  Problem: Coping: Goal: Level of anxiety will decrease Outcome: Progressing   Problem: Pain Managment: Goal: General experience of comfort will improve Outcome: Progressing   Problem: Safety: Goal: Ability to remain free from injury will improve Outcome: Progressing   

## 2019-11-15 NOTE — Progress Notes (Signed)
Dr. Alphonzo Severance aware of patient.  Plan for transfer to [facility] hospital and admit to hospitalist service.  NPO after midnight tonight.  Surgery posted for ~9 AM tomorrow.  Please hold any blood thinners until postoperative care.

## 2019-11-16 ENCOUNTER — Inpatient Hospital Stay (HOSPITAL_COMMUNITY): Payer: Medicare Other

## 2019-11-16 ENCOUNTER — Inpatient Hospital Stay (HOSPITAL_COMMUNITY): Admission: RE | Admit: 2019-11-16 | Payer: Medicare Other | Source: Home / Self Care | Admitting: Orthopedic Surgery

## 2019-11-16 ENCOUNTER — Encounter (HOSPITAL_COMMUNITY): Payer: Self-pay | Admitting: Internal Medicine

## 2019-11-16 ENCOUNTER — Inpatient Hospital Stay (HOSPITAL_COMMUNITY): Payer: Medicare Other | Admitting: Certified Registered"

## 2019-11-16 ENCOUNTER — Encounter (HOSPITAL_COMMUNITY)
Admission: EM | Disposition: A | Discharge status: Skilled Nursing Facility | Payer: Self-pay | Source: Home / Self Care | Attending: Internal Medicine

## 2019-11-16 ENCOUNTER — Other Ambulatory Visit: Payer: Self-pay

## 2019-11-16 DIAGNOSIS — E119 Type 2 diabetes mellitus without complications: Secondary | ICD-10-CM | POA: Diagnosis not present

## 2019-11-16 DIAGNOSIS — E1122 Type 2 diabetes mellitus with diabetic chronic kidney disease: Secondary | ICD-10-CM | POA: Diagnosis not present

## 2019-11-16 DIAGNOSIS — I25810 Atherosclerosis of coronary artery bypass graft(s) without angina pectoris: Secondary | ICD-10-CM | POA: Diagnosis not present

## 2019-11-16 DIAGNOSIS — S72001S Fracture of unspecified part of neck of right femur, sequela: Secondary | ICD-10-CM | POA: Diagnosis not present

## 2019-11-16 DIAGNOSIS — N183 Chronic kidney disease, stage 3 unspecified: Secondary | ICD-10-CM

## 2019-11-16 HISTORY — PX: FEMUR IM NAIL: SHX1597

## 2019-11-16 LAB — BASIC METABOLIC PANEL
Anion gap: 8 (ref 5–15)
BUN: 21 mg/dL (ref 8–23)
CO2: 28 mmol/L (ref 22–32)
Calcium: 8.6 mg/dL — ABNORMAL LOW (ref 8.9–10.3)
Chloride: 106 mmol/L (ref 98–111)
Creatinine, Ser: 1.53 mg/dL — ABNORMAL HIGH (ref 0.61–1.24)
GFR calc Af Amer: 48 mL/min — ABNORMAL LOW (ref >60)
GFR calc non Af Amer: 41 mL/min — ABNORMAL LOW (ref >60)
Glucose, Bld: 154 mg/dL — ABNORMAL HIGH (ref 70–99)
Potassium: 4.8 mmol/L (ref 3.5–5.1)
Sodium: 142 mmol/L (ref 135–145)

## 2019-11-16 LAB — GLUCOSE, CAPILLARY
Glucose-Capillary: 143 mg/dL — ABNORMAL HIGH (ref 70–99)
Glucose-Capillary: 148 mg/dL — ABNORMAL HIGH (ref 70–99)
Glucose-Capillary: 164 mg/dL — ABNORMAL HIGH (ref 70–99)
Glucose-Capillary: 158 mg/dL — ABNORMAL HIGH (ref 70–99)
Glucose-Capillary: 202 mg/dL — ABNORMAL HIGH (ref 70–99)
Glucose-Capillary: 178 mg/dL — ABNORMAL HIGH (ref 70–99)

## 2019-11-16 LAB — MRSA PCR SCREENING: MRSA by PCR: NEGATIVE

## 2019-11-16 LAB — CBC
HCT: 38.2 % — ABNORMAL LOW (ref 39.0–52.0)
Hemoglobin: 11.8 g/dL — ABNORMAL LOW (ref 13.0–17.0)
MCH: 31.6 pg (ref 26.0–34.0)
MCHC: 30.9 g/dL (ref 30.0–36.0)
MCV: 102.4 fL — ABNORMAL HIGH (ref 80.0–100.0)
Platelets: 181 10*3/uL (ref 150–400)
RBC: 3.73 MIL/uL — ABNORMAL LOW (ref 4.22–5.81)
RDW: 13.5 % (ref 11.5–15.5)
WBC: 13.7 10*3/uL — ABNORMAL HIGH (ref 4.0–10.5)
nRBC: 0 % (ref 0.0–0.2)

## 2019-11-16 SURGERY — INSERTION, INTRAMEDULLARY ROD, FEMUR
Anesthesia: General | Laterality: Right

## 2019-11-16 MED ORDER — METHOCARBAMOL 500 MG PO TABS: 500.0000 mg | ORAL_TABLET | Freq: Four times a day (QID) | ORAL | Status: DC | PRN

## 2019-11-16 MED ORDER — FENTANYL CITRATE (PF) 250 MCG/5ML IJ SOLN
INTRAMUSCULAR | Status: AC
  Filled 2019-11-16: qty 5

## 2019-11-16 MED ORDER — METHOCARBAMOL 1000 MG/10ML IJ SOLN
500.0000 mg | Freq: Four times a day (QID) | INTRAVENOUS | Status: DC | PRN
  Filled 2019-11-16: qty 5

## 2019-11-16 MED ORDER — MENTHOL 3 MG MT LOZG
1.0000 | LOZENGE | OROMUCOSAL | Status: DC | PRN
  Filled 2019-11-16: qty 9

## 2019-11-16 MED ORDER — POVIDONE-IODINE 10 % EX SWAB: 2.0000 "application " | Freq: Once | CUTANEOUS | Status: DC

## 2019-11-16 MED ORDER — ALBUMIN HUMAN 5 % IV SOLN
INTRAVENOUS | Status: DC | PRN
Start: 2019-11-16 — End: 2019-11-16

## 2019-11-16 MED ORDER — ONDANSETRON HCL 4 MG/2ML IJ SOLN
4.0000 mg | Freq: Four times a day (QID) | INTRAMUSCULAR | Status: DC | PRN
  Administered 2019-11-25 – 2019-11-29 (×2): 4 mg via INTRAVENOUS
  Filled 2019-11-16 (×2): qty 2

## 2019-11-16 MED ORDER — PHENYLEPHRINE HCL (PRESSORS) 10 MG/ML IV SOLN
INTRAVENOUS | Status: DC | PRN
  Administered 2019-11-16 (×2): 40 ug via INTRAVENOUS
  Administered 2019-11-16: 80 ug via INTRAVENOUS
  Administered 2019-11-16: 120 ug via INTRAVENOUS

## 2019-11-16 MED ORDER — LACTATED RINGERS IV SOLN: INTRAVENOUS | Status: DC

## 2019-11-16 MED ORDER — METOCLOPRAMIDE HCL 5 MG/ML IJ SOLN: 5.0000 mg | Freq: Three times a day (TID) | INTRAMUSCULAR | Status: DC | PRN

## 2019-11-16 MED ORDER — PROPOFOL 10 MG/ML IV BOLUS
INTRAVENOUS | Status: DC | PRN
  Administered 2019-11-16: 70 mg via INTRAVENOUS

## 2019-11-16 MED ORDER — METOCLOPRAMIDE HCL 5 MG PO TABS: 5.0000 mg | ORAL_TABLET | Freq: Three times a day (TID) | ORAL | Status: DC | PRN

## 2019-11-16 MED ORDER — DOCUSATE SODIUM 100 MG PO CAPS
100.0000 mg | ORAL_CAPSULE | Freq: Two times a day (BID) | ORAL | Status: DC
  Administered 2019-11-16 – 2019-11-30 (×23): 100 mg via ORAL
  Filled 2019-11-16 (×24): qty 1

## 2019-11-16 MED ORDER — VANCOMYCIN HCL 1000 MG IV SOLR
INTRAVENOUS | Status: DC | PRN
  Administered 2019-11-16: 1000 mg via INTRAVENOUS

## 2019-11-16 MED ORDER — 0.9 % SODIUM CHLORIDE (POUR BTL) OPTIME
TOPICAL | Status: DC | PRN
  Administered 2019-11-16: 1000 mL

## 2019-11-16 MED ORDER — LIDOCAINE 2% (20 MG/ML) 5 ML SYRINGE
INTRAMUSCULAR | Status: DC | PRN
  Administered 2019-11-16: 100 mg via INTRAVENOUS

## 2019-11-16 MED ORDER — EPHEDRINE SULFATE 50 MG/ML IJ SOLN
INTRAMUSCULAR | Status: DC | PRN
  Administered 2019-11-16: 5 mg via INTRAVENOUS
  Administered 2019-11-16: 10 mg via INTRAVENOUS
  Administered 2019-11-16: 5 mg via INTRAVENOUS

## 2019-11-16 MED ORDER — RIVAROXABAN 10 MG PO TABS: 10.0000 mg | ORAL_TABLET | Freq: Every day | ORAL | Status: DC

## 2019-11-16 MED ORDER — LACTATED RINGERS IV SOLN: INTRAVENOUS | Status: AC

## 2019-11-16 MED ORDER — ONDANSETRON HCL 4 MG PO TABS: 4.0000 mg | ORAL_TABLET | Freq: Four times a day (QID) | ORAL | Status: DC | PRN

## 2019-11-16 MED ORDER — ACETAMINOPHEN 10 MG/ML IV SOLN
INTRAVENOUS | Status: DC | PRN
Start: 2019-11-16 — End: 2019-11-16
  Administered 2019-11-16: 1000 mg via INTRAVENOUS

## 2019-11-16 MED ORDER — FENTANYL CITRATE (PF) 100 MCG/2ML IJ SOLN: 25.0000 ug | INTRAMUSCULAR | Status: DC | PRN

## 2019-11-16 MED ORDER — PROPOFOL 10 MG/ML IV BOLUS
INTRAVENOUS | Status: AC
  Filled 2019-11-16: qty 20

## 2019-11-16 MED ORDER — VANCOMYCIN HCL IN DEXTROSE 1-5 GM/200ML-% IV SOLN
1000.0000 mg | INTRAVENOUS | Status: DC
  Filled 2019-11-16: qty 200

## 2019-11-16 MED ORDER — DEXAMETHASONE SODIUM PHOSPHATE 10 MG/ML IJ SOLN
INTRAMUSCULAR | Status: DC | PRN
  Administered 2019-11-16: 5 mg via INTRAVENOUS

## 2019-11-16 MED ORDER — PHENOL 1.4 % MT LIQD: 1.0000 | OROMUCOSAL | Status: DC | PRN

## 2019-11-16 MED ORDER — VANCOMYCIN HCL IN DEXTROSE 1-5 GM/200ML-% IV SOLN
1000.0000 mg | Freq: Two times a day (BID) | INTRAVENOUS | Status: AC
  Filled 2019-11-16: qty 200

## 2019-11-16 MED ORDER — ACETAMINOPHEN 500 MG PO TABS
1000.0000 mg | ORAL_TABLET | Freq: Four times a day (QID) | ORAL | Status: AC
  Administered 2019-11-16 – 2019-11-17 (×2): 1000 mg via ORAL
  Filled 2019-11-16 (×2): qty 2

## 2019-11-16 MED ORDER — OXYCODONE HCL 5 MG PO TABS: 5.0000 mg | ORAL_TABLET | ORAL | Status: DC | PRN

## 2019-11-16 MED ORDER — CHLORHEXIDINE GLUCONATE 0.12 % MT SOLN: 15.0000 mL | Freq: Once | OROMUCOSAL | Status: AC

## 2019-11-16 MED ORDER — SUCCINYLCHOLINE CHLORIDE 200 MG/10ML IV SOSY
PREFILLED_SYRINGE | INTRAVENOUS | Status: DC | PRN
  Administered 2019-11-16: 100 mg via INTRAVENOUS

## 2019-11-16 MED ORDER — PHENYLEPHRINE HCL-NACL 10-0.9 MG/250ML-% IV SOLN
INTRAVENOUS | Status: DC | PRN
  Administered 2019-11-16: 25 ug/min via INTRAVENOUS

## 2019-11-16 MED ORDER — HYDROMORPHONE HCL 1 MG/ML IJ SOLN: 0.5000 mg | INTRAMUSCULAR | Status: DC | PRN

## 2019-11-16 MED ORDER — FENTANYL CITRATE (PF) 250 MCG/5ML IJ SOLN
INTRAMUSCULAR | Status: DC | PRN
  Administered 2019-11-16: 25 ug via INTRAVENOUS
  Administered 2019-11-16 (×3): 50 ug via INTRAVENOUS

## 2019-11-16 MED ORDER — ONDANSETRON HCL 4 MG/2ML IJ SOLN: 4.0000 mg | Freq: Once | INTRAMUSCULAR | Status: DC | PRN

## 2019-11-16 MED ORDER — CHLORHEXIDINE GLUCONATE 4 % EX LIQD: 60.0000 mL | Freq: Once | CUTANEOUS | Status: DC

## 2019-11-16 MED ORDER — CHLORHEXIDINE GLUCONATE 0.12 % MT SOLN
OROMUCOSAL | Status: AC
  Administered 2019-11-16: 15 mL via OROMUCOSAL
  Filled 2019-11-16: qty 15

## 2019-11-16 SURGICAL SUPPLY — 31 items
BLADE SURG 15 STRL LF DISP TIS (BLADE) ×1 IMPLANT
BLADE SURG 15 STRL SS (BLADE) ×2
COVER PERINEAL POST (MISCELLANEOUS) ×2 IMPLANT
COVER SURGICAL LIGHT HANDLE (MISCELLANEOUS) ×2 IMPLANT
DRAPE STERI IOBAN 125X83 (DRAPES) ×2 IMPLANT
DRSG AQUACEL AG ADV 3.5X 4 (GAUZE/BANDAGES/DRESSINGS) ×2 IMPLANT
DURAPREP 26ML APPLICATOR (WOUND CARE) ×2 IMPLANT
ELECT REM PT RETURN 9FT ADLT (ELECTROSURGICAL)
ELECTRODE REM PT RTRN 9FT ADLT (ELECTROSURGICAL) ×1 IMPLANT
GLOVE BIOGEL PI IND STRL 8 (GLOVE) ×1 IMPLANT
GLOVE BIOGEL PI INDICATOR 8 (GLOVE) ×1
GLOVE ECLIPSE 8.0 STRL XLNG CF (GLOVE) ×2 IMPLANT
GOWN STRL REUS W/ TWL LRG LVL3 (GOWN DISPOSABLE) ×1 IMPLANT
GOWN STRL REUS W/ TWL XL LVL3 (GOWN DISPOSABLE) IMPLANT
GOWN STRL REUS W/TWL LRG LVL3 (GOWN DISPOSABLE) ×4
GOWN STRL REUS W/TWL XL LVL3 (GOWN DISPOSABLE) ×2
GUIDE PIN 3.2X343 (PIN) ×2
GUIDE PIN 3.2X343MM (PIN) ×4
KIT BASIN OR (CUSTOM PROCEDURE TRAY) ×2 IMPLANT
KIT TURNOVER KIT B (KITS) ×2 IMPLANT
NAIL LOCK CANN 10X420 125D RT (Miscellaneous) ×1 IMPLANT
NS IRRIG 1000ML POUR BTL (IV SOLUTION) ×2 IMPLANT
PACK GENERAL/GYN (CUSTOM PROCEDURE TRAY) ×2 IMPLANT
PAD ARMBOARD 7.5X6 YLW CONV (MISCELLANEOUS) ×4 IMPLANT
PIN GUIDE 3.2X343MM (PIN) IMPLANT
SCREW LAG COMPR KIT 110/105 (Screw) ×1 IMPLANT
SUT ETHILON 3 0 PS 1 (SUTURE) ×3 IMPLANT
SUT VICRYL 0 AB UR-6 (SUTURE) ×2 IMPLANT
TOWEL GREEN STERILE (TOWEL DISPOSABLE) ×2 IMPLANT
TOWEL GREEN STERILE FF (TOWEL DISPOSABLE) ×2 IMPLANT
WATER STERILE IRR 1000ML POUR (IV SOLUTION) ×2 IMPLANT

## 2019-11-16 NOTE — Brief Op Note (Signed)
   11/15/2019 - 11/16/2019  11:06 AM  PATIENT:  [name]  86 y.o. male  PRE-OPERATIVE DIAGNOSIS:  RIGHT HIP FRACTURE  POST-OPERATIVE DIAGNOSIS:  RIGHT HIP FRACTURE  PROCEDURE:  Procedure(s): RIGHT HIP INTRAMEDULLARY (IM) NAIL FEMORAL  SURGEON:  Surgeon(s): Marlou Sa, Tonna Corner, MD  ASSISTANT: magnant pa  ANESTHESIA:   general  EBL: 75 ml    Total I/O In: 250 [IV Piggyback:250] Out: 80 [Blood:50]  BLOOD ADMINISTERED: none  DRAINS: none   LOCAL MEDICATIONS USED:  none  SPECIMEN:  No Specimen  COUNTS:  YES  TOURNIQUET:  * No tourniquets in log *  DICTATION: .Other Dictation: Dictation Number [id number]  PLAN OF CARE: Admit to inpatient   PATIENT DISPOSITION:  PACU - hemodynamically stable

## 2019-11-16 NOTE — Plan of Care (Signed)
  Problem: Education: Goal: Verbalization of understanding the information provided (i.e., activity precautions, restrictions, etc) will improve Outcome: Progressing   Problem: Clinical Measurements: Goal: Postoperative complications will be avoided or minimized Outcome: Progressing   Problem: Pain Management: Goal: Pain level will decrease Outcome: Progressing   Problem: Education: Goal: Knowledge of General Education information will improve Description: Including pain rating scale, medication(s)/side effects and non-pharmacologic comfort measures Outcome: Progressing

## 2019-11-16 NOTE — Anesthesia Postprocedure Evaluation (Signed)
Anesthesia Post Note  Patient: [name]  Procedure(s) Performed: RIGHT HIP INTRAMEDULLARY (IM) NAIL FEMORAL (Right )     Patient location during evaluation: PACU Anesthesia Type: General Level of consciousness: sedated and patient cooperative Pain management: pain level controlled Vital Signs Assessment: post-procedure vital signs reviewed and stable Respiratory status: spontaneous breathing Cardiovascular status: stable Anesthetic complications: no   No complications documented.  Last Vitals:  Vitals:   11/16/19 1519 11/16/19 1915  BP: (!) 145/83 (!) 175/115  Pulse: 89 79  Resp: 17 16  Temp: 36.5 C 36.7 C  SpO2: 99% 91%    Last Pain:  Vitals:   11/16/19 2119  TempSrc:   PainSc: 0-No pain                 Nolon Nations

## 2019-11-16 NOTE — Progress Notes (Addendum)
ANTICOAGULATION CONSULT NOTE - Initial Consult  Pharmacy Consult for Xarelto Indication: VTE prophylaxis  Allergies  Allergen Reactions  . Other Other (See Comments)    Pt reports allergic to a medication but unsure of name or type of med  . Cymbalta [Duloxetine Hcl] Other (See Comments)    Dizziness, hallucinations.   . Keflex [Cephalexin] Other (See Comments)    dizziness  . Simvastatin Other (See Comments)    Joint pain  . Sudafed [Pseudoephedrine] Other (See Comments)    Dizziness    Patient Measurements: Height: 6' (182.9 cm) Weight: 81.6 kg (180 lb) IBW/kg (Calculated) : 77.6  Vital Signs: Temp: 97.8 F (36.6 C) (07/17 1237) Temp Source: Oral (07/17 1237) BP: 145/81 (07/17 1237) Pulse Rate: 92 (07/17 1237)  Labs: Recent Labs    11/15/19 1542 11/16/19 0002  HGB 11.8* 11.8*  HCT 37.3* 38.2*  PLT 172 181  LABPROT 13.6  --   INR 1.1  --   CREATININE 1.44* 1.53*    Estimated Creatinine Clearance: 39.4 mL/min (A) (by C-G formula based on SCr of 1.53 mg/dL (H)).   Medical History: Past Medical History:  Diagnosis Date  . 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 Colon polyps   . Coronary atherosclerosis of unspecified type of vessel, native or graft   . Diabetes ([place])   . Difficult intubation   . 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 Esophageal reflux   . High cholesterol   . History of IBS 02/27/2009  . Hypertension    pt denies, he says he has a h/o hypotension. If BP up he adjusts the Florinef  . Jejunitis with partial SBO 05/20/2017  . Orthostatic hypotension    "BP has been dropping alot when I stand up for the last month or so" (02/17/2016)  . Prostatitis   . Upper GI bleed 06/24/2018  . Vitamin B 12 deficiency   . Vitamin D deficiency     Assessment: 86 year old male status post right hip repair following a mechanical fall. Pharmacy consulted to dose Xarelto for VTE prophylaxis.  Goal of Therapy:  Monitor platelets by anticoagulation protocol:  Yes   Plan:  - Start Xarelto 10 mg PO daily with supper tomorrow (>24 hours post-op) - Monitor renal function and signs and symptoms of bleed     Jlen Wintle L. Devin Going, [place] PGY2 Pharmacy Resident [phone] 11/16/19      1:01 PM  Please check AMION for all [place] phone numbers After 10:00 PM, call the [place] [phone]

## 2019-11-16 NOTE — Progress Notes (Addendum)
3539 Pt is A&O x3, NPO maint post midnight. [name] is here, able to to talk to Dr dean. Surgical consent signed. Pt to short stay. 1235 Received pt from PACU, sleepy. Right hip incisions with Aquacel dressing dry and intact. Denies pain at this time.

## 2019-11-16 NOTE — Progress Notes (Signed)
PROGRESS NOTE    [name]  TGG:[id number] DOB: [birth year] DOA: 11/15/2019 PCP: Dennis Pinto, MD    Brief Narrative:  [name] is a 86 y.o. male with a history of diet-controlled type 2 diabetes with stage III chronic kidney disease and diabetic neuropathy, coronary artery disease status post bypass, CVA.  Patient presents with right hip pain after a mechanical fall with the patient tripping as he was trying to get out of the car.  Pain started immediately after the fall and is worse with movement and improved with rest.  EMS was called and the patient was brought to the emergency department for evaluation.   X-rays show a right intertrochanteric hip fracture    Consultants:   orthopedics  Procedures:   Antimicrobials:       Subjective: Pt without complaints. States pain controlled.  Objective: Vitals:   11/16/19 1158 11/16/19 1213 11/16/19 1215 11/16/19 1237  BP: 139/75 (!) 148/89  (!) 145/81  Pulse: 99 (!) 105  92  Resp: 13 17  16   Temp:   97.7 F (36.5 C) 97.8 F (36.6 C)  TempSrc:    Oral  SpO2: 93% 95%  100%  Weight:      Height:        Intake/Output Summary (Last 24 hours) at 11/16/2019 1445 Last data filed at 11/16/2019 1138 Gross per 24 hour  Intake 850 ml  Output 250 ml  Net 600 ml   Filed Weights   11/15/19 1405  Weight: 81.6 kg    Examination:  General exam: Appears calm and comfortable , family at bedside Respiratory system: Clear to auscultation. Respiratory effort normal. Cardiovascular system: S1 & S2 heard, RRR. No JVD, murmurs, rubs, gallops or clicks.  Gastrointestinal system: Abdomen is nondistended, soft and nontender. Normal bowel sounds heard. Central nervous system: Alert and oriented.  Extremities: no edema , scd in place Skin: No rashes, lesions or ulcers Psychiatry: Judgement and insight appear normal. Mood & affect appropriate.     Data Reviewed: I have personally reviewed following labs and imaging  studies  CBC: Recent Labs  Lab 11/15/19 1542 11/16/19 0002  WBC 6.9 13.7*  NEUTROABS 4.5  --   HGB 11.8* 11.8*  HCT 37.3* 38.2*  MCV 102.8* 102.4*  PLT 172 703   Basic Metabolic Panel: Recent Labs  Lab 11/15/19 1542 11/16/19 0002  NA 142 142  K 3.9 4.8  CL 106 106  CO2 28 28  GLUCOSE 153* 154*  BUN 23 21  CREATININE 1.44* 1.53*  CALCIUM 8.6* 8.6*   GFR: Estimated Creatinine Clearance: 39.4 mL/min (A) (by C-G formula based on SCr of 1.53 mg/dL (H)). Liver Function Tests: No results for input(s): AST, ALT, ALKPHOS, BILITOT, PROT, ALBUMIN in the last 168 hours. No results for input(s): LIPASE, AMYLASE in the last 168 hours. No results for input(s): AMMONIA in the last 168 hours. Coagulation Profile: Recent Labs  Lab 11/15/19 1542  INR 1.1   Cardiac Enzymes: No results for input(s): CKTOTAL, CKMB, CKMBINDEX, TROPONINI in the last 168 hours. BNP (last 3 results) No results for input(s): PROBNP in the last 8760 hours. HbA1C: No results for input(s): HGBA1C in the last 72 hours. CBG: Recent Labs  Lab 11/16/19 0717 11/16/19 0912 11/16/19 1151 11/16/19 1238  GLUCAP 143* 148* 164* 158*   Lipid Profile: No results for input(s): CHOL, HDL, LDLCALC, TRIG, CHOLHDL, LDLDIRECT in the last 72 hours. Thyroid Function Tests: No results for input(s): TSH, T4TOTAL, FREET4, T3FREE, THYROIDAB in  the last 72 hours. Anemia Panel: No results for input(s): VITAMINB12, FOLATE, FERRITIN, TIBC, IRON, RETICCTPCT in the last 72 hours. Sepsis Labs: No results for input(s): PROCALCITON, LATICACIDVEN in the last 168 hours.  Recent Results (from the past 240 hour(s))  SARS Coronavirus 2 by RT PCR (hospital order, performed in [facility] hospital lab) Nasopharyngeal Nasopharyngeal Swab     Status: None   Collection Time: 11/15/19  3:30 PM   Specimen: Nasopharyngeal Swab  Result Value Ref Range Status   SARS Coronavirus 2 NEGATIVE NEGATIVE Final    Comment: (NOTE) SARS-CoV-2 target  nucleic acids are NOT DETECTED.  The SARS-CoV-2 RNA is generally detectable in upper and lower respiratory specimens during the acute phase of infection. The lowest concentration of SARS-CoV-2 viral copies this assay can detect is 250 copies / mL. A negative result does not preclude SARS-CoV-2 infection and should not be used as the sole basis for treatment or other patient management decisions.  A negative result may occur with improper specimen collection / handling, submission of specimen other than nasopharyngeal swab, presence of viral mutation(s) within the areas targeted by this assay, and inadequate number of viral copies (<250 copies / mL). A negative result must be combined with clinical observations, patient history, and epidemiological information.  Fact Sheet for Patients:   StrictlyIdeas.no  Fact Sheet for Healthcare Providers: BankingDealers.co.za  This test is not yet approved or  cleared by the [place] FDA and has been authorized for detection and/or diagnosis of SARS-CoV-2 by FDA under an Emergency Use Authorization (EUA).  This EUA will remain in effect (meaning this test can be used) for the duration of the COVID-19 declaration under Section 564(b)(1) of the Act, 21 U.S.C. section 360bbb-3(b)(1), unless the authorization is terminated or revoked sooner.  Performed at [facility], [address], [place], [place] [address]   MRSA PCR Screening     Status: None   Collection Time: 11/16/19  1:03 AM   Specimen: Nasal Mucosa; Nasopharyngeal  Result Value Ref Range Status   MRSA by PCR NEGATIVE NEGATIVE Final    Comment:        The GeneXpert MRSA Assay (FDA approved for NASAL specimens only), is one component of a comprehensive MRSA colonization surveillance program. It is not intended to diagnose MRSA infection nor to guide or monitor treatment for MRSA infections. Performed at [facility],  [place] [address], [place], [place] [address]          Radiology Studies: Pelvis Portable  Result Date: 11/16/2019 CLINICAL DATA:  Right hip surgery. EXAM: PORTABLE PELVIS 1-2 VIEWS COMPARISON:  Yesterday. FINDINGS: Interval intramedullary rod and screw fixation of the previously demonstrated comminuted right intertrochanteric fracture. Essentially anatomic position and alignment of the major fragments. The lesser trochanter fragment remains medially displaced. IMPRESSION: Hardware fixation of the previously demonstrated comminuted right intertrochanteric fracture. Electronically Signed   By: Claudie Revering M.D.   On: 11/16/2019 14:07   DG C-Arm 1-60 Min  Result Date: 11/16/2019 CLINICAL DATA:  Right hip fracture. EXAM: DG C-ARM 1-60 MIN; DG HIP (WITH OR WITHOUT PELVIS) 2-3V RIGHT COMPARISON:  11/15/2019 FINDINGS: Eighteen C-arm views of the right hip and femur demonstrate intramedullary rod and compression screw fixation of the previously demonstrated comminuted right intertrochanteric fracture. Anatomic position and alignment on the later images. IMPRESSION: Hardware fixation of the previously demonstrated right intertrochanteric fracture. Electronically Signed   By: Claudie Revering M.D.   On: 11/16/2019 14:06   DG HIP UNILAT WITH  PELVIS 2-3 VIEWS RIGHT  Result Date: 11/16/2019 CLINICAL DATA:  Right hip fracture. EXAM: DG C-ARM 1-60 MIN; DG HIP (WITH OR WITHOUT PELVIS) 2-3V RIGHT COMPARISON:  11/15/2019 FINDINGS: Eighteen C-arm views of the right hip and femur demonstrate intramedullary rod and compression screw fixation of the previously demonstrated comminuted right intertrochanteric fracture. Anatomic position and alignment on the later images. IMPRESSION: Hardware fixation of the previously demonstrated right intertrochanteric fracture. Electronically Signed   By: Claudie Revering M.D.   On: 11/16/2019 14:06   DG Hip Unilat With Pelvis 2-3 Views Right  Result Date: 11/15/2019 CLINICAL DATA:  Fall, right  hip pain EXAM: DG HIP (WITH OR WITHOUT PELVIS) 2-3V RIGHT COMPARISON:  None. FINDINGS: Comminuted intertrochanteric right hip fracture seen with mild Verus angulation and avulsion of the lesser trochanter. Femoral head is still seated within the right acetabulum. Limited evaluation of the left hip is unremarkable. The pelvis and sacrum appear intact. IMPRESSION: Comminuted intertrochanteric right hip fracture. Electronically Signed   By: Fidela Salisbury MD   On: 11/15/2019 15:35        Scheduled Meds: . acetaminophen  1,000 mg Oral Q6H  . [START ON 11/18/2019] aspirin EC  162 mg Oral Daily  . docusate sodium  100 mg Oral BID  . fludrocortisone  0.1 mg Oral Daily  . insulin aspart  0-5 Units Subcutaneous QHS  . insulin aspart  0-9 Units Subcutaneous TID WC  . pantoprazole  40 mg Oral Daily  . polyethylene glycol  17 g Oral Daily  . pravastatin  40 mg Oral q1800  . pregabalin  100 mg Oral TID  . [START ON 11/17/2019] rivaroxaban  10 mg Oral Q supper   Continuous Infusions: . sodium chloride 100 mL/hr at 11/15/19 2307  . lactated ringers 75 mL/hr at 11/16/19 1249  . methocarbamol (ROBAXIN) IV    . vancomycin      Assessment & Plan:   Principal Problem:   Closed right hip fracture (HCC) Active Problems:   GERD   Supine hypertension   PVD (peripheral vascular disease) (HCC)   Coronary artery disease involving coronary bypass graft of native heart without angina pectoris   Diabetes mellitus type 2, diet-controlled ([place])   CKD stage 3 due to type 2 diabetes mellitus (HCC)   Dysautonomia orthostatic hypotension syndrome (HCC)   Diabetic neuropathy ([place])   History of lacunar cerebrovascular accident (CVA)   1. Closed right hip fracture S/p Rt hip IM nail palcment today Pain mx Ortho following PT when cleared by ortho Bowel regimen  2. Diabetes with neuropathy and peripheral vascular disease Diet controlled RISS Ck FS  3. CKD stage IIIb Appears to be at baseline. Continue  to monitor  4. Dysautonomia orthostatic hypotension syndrome        Will continue fludrocortisone  5. History of lacunar CVA-on asa 6. Coronary artery disease 7. Continue statin, aspirin    DVT prophylaxis: Xarelto Code Status: DNR Family Communication: None at bedside Disposition Plan: Home versus rehab Status is: Inpatient  Remains inpatient appropriate because:Ongoing active pain requiring inpatient pain management   Dispo: The patient is from: Home              Anticipated d/c is to: home v.s. rehab               Anticipated d/c date is: 2 days              Patient currently is not medically stable to d/c.needs PT evaluation when  cleared by orthopedic as pt had surgery today.             LOS: 1 day   Time spent: 35 min with >50% on coc    Nolberto Hanlon, MD [facility] Pager [phone]  If 7PM-7AM, please contact night-coverage [email or web address] Password [facility] 11/16/2019, 2:45 PM

## 2019-11-16 NOTE — Anesthesia Preprocedure Evaluation (Addendum)
Anesthesia Evaluation  Patient identified by MRN, date of birth, ID band Patient awake    Reviewed: Allergy & Precautions, NPO status , Patient's Chart, lab work & pertinent test results  History of Anesthesia Complications (+) DIFFICULT AIRWAY and history of anesthetic complications  Airway Mallampati: II  TM Distance: >3 FB Neck ROM: Full    Dental  (+) Edentulous Upper, Partial Lower, Missing, Dental Advisory Given   Pulmonary former smoker,    breath sounds clear to auscultation       Cardiovascular hypertension, + CAD and + Peripheral Vascular Disease   Rhythm:Regular Rate:Normal  Echo 01/2018 - Left ventricle: The cavity size was normal. There was mild concentric hypertrophy. Systolic function was normal. The estimated ejection fraction was in the range of 55% to 60%. Wall motion was normal; there were no regional wall motion abnormalities. Doppler parameters are consistent with abnormal left ventricular relaxation (grade 1 diastolic dysfunction). Doppler parameters are consistent with high ventricular filling pressure.  - Aortic valve: Transvalvular velocity was within the normal range.  There was no stenosis. There was no regurgitation.  - Mitral valve: Transvalvular velocity was within the normal range. There was no evidence for stenosis. There was trivial regurgitation.  - Left atrium: The atrium was mildly dilated.  - Right ventricle: The cavity size was normal. Wall thickness was normal. Systolic function was normal.  - Atrial septum: No defect or patent foramen ovale was identified by color flow Doppler.  - Tricuspid valve: There was mild regurgitation.  - Pulmonary arteries: Systolic pressure was within the normal range. PA peak pressure: 30 mm Hg (S).  - Global longitudinal strain -16.4% (mildly abnormal).    Neuro/Psych    GI/Hepatic PUD, GERD  ,  Endo/Other  diabetes  Renal/GU Renal disease      Musculoskeletal  (+) Arthritis ,   Abdominal   Peds  Hematology   Anesthesia Other Findings   Reproductive/Obstetrics                           Anesthesia Physical  Anesthesia Plan  ASA: III  Anesthesia Plan: General   Post-op Pain Management:    Induction: Intravenous  PONV Risk Score and Plan: 3 and Dexamethasone, Ondansetron and Treatment may vary due to age or medical condition  Airway Management Planned: Oral ETT and Video Laryngoscope Planned  Additional Equipment: None  Intra-op Plan:   Post-operative Plan: Extubation in OR  Informed Consent: I have reviewed the patients History and Physical, chart, labs and discussed the procedure including the risks, benefits and alternatives for the proposed anesthesia with the patient or authorized representative who has indicated his/her understanding and acceptance.   Patient has DNR.  Discussed DNR with patient and Suspend DNR.   Dental advisory given  Plan Discussed with: CRNA and Anesthesiologist  Anesthesia Plan Comments:        Anesthesia Quick Evaluation

## 2019-11-16 NOTE — Op Note (Signed)
NAME: [name], GAUGHAN MEDICAL RECORD [id number] ACCOUNT [id number] DATE OF BIRTH:August 27, 1933 FACILITY: [facility] LOCATION: [facility] PHYSICIAN:Marin Wisner Randel Pigg, MD  OPERATIVE REPORT  DATE OF PROCEDURE:  11/16/2019  PREOPERATIVE DIAGNOSIS:  Right hip intertrochanteric fracture.  POSTOPERATIVE DIAGNOSIS:  Right hip intertrochanteric fracture.  PROCEDURE:  Right hip intertrochanteric fracture IMHS nailing using [employer]hew InterTAN nail 42 x 10 mm, 125 degrees with 10 mm lag screw and 105 compression screw.  SURGEON:  Meredith Pel, MD  ASSISTANT:  Annie Main, PA  INDICATIONS:  The patient is an 86 year old patient with right hip pain and a fracture after a fall, presents now for operative management after explanation of risks and benefits.  PROCEDURE IN DETAIL:  The patient was brought to the operating room where general anesthetic was induced.  Preoperative antibiotics administered.  Timeout was called.  Right leg was then placed on the Hana bed in good position and alignment.  Fracture  reduction was confirmed in the AP and lateral planes under fluoroscopy.  The right hip was then pre-scrubbed with alcohol and Betadine, allowed to air dry, prepped with DuraPrep solution and draped in a sterile manner.  Ioban used to cover the operative  field.  A guide pin was then placed in the tip of the trochanter into the fracture site.  In accordance with preoperative templating, proximal reaming was performed and a 10 x 42 mm nail was placed.  A separate incision was made for compression and lag  screw.  Tip-apex distance of approximately 5 mm.  Excellent compression of the fracture achieved.  Compression and lag screw placed in the inferior half of the femoral neck.  Following placement and confirmation of correct hardware placement in the AP  and lateral planes under fluoroscopy at the hip and the knee, the outrigger was removed.  Irrigation was performed of the incisions and they were  closed using 0 Vicryl suture, 2-0 Vicryl suture and 3-0 nylon.  Aquacel dressing was placed.  The patient  tolerated the procedure well without immediate complication.  Luke's assistance was required for opening and closing, mobilization of tissues.  His assistance was a medical necessity.  VN/NUANCE  D:11/16/2019 T:11/16/2019 JOB:[id number]

## 2019-11-16 NOTE — Anesthesia Procedure Notes (Signed)
Procedure Name: Intubation Date/Time: 11/16/2019 10:09 AM Performed by: Clearnce Sorrel, CRNA Pre-anesthesia Checklist: Patient identified, Emergency Drugs available, Suction available, Patient being monitored and Timeout performed Patient Re-evaluated:Patient Re-evaluated prior to induction Oxygen Delivery Method: Circle system utilized Preoxygenation: Pre-oxygenation with 100% oxygen Induction Type: IV induction Ventilation: Mask ventilation without difficulty and Oral airway inserted - appropriate to patient size Laryngoscope Size: Mac and 4 Grade View: Grade I Tube type: Oral Tube size: 7.5 mm Number of attempts: 1 Airway Equipment and Method: Stylet Placement Confirmation: ETT inserted through vocal cords under direct vision,  positive ETCO2 and breath sounds checked- equal and bilateral Secured at: 23 cm Tube secured with: Tape Dental Injury: Teeth and Oropharynx as per pre-operative assessment

## 2019-11-16 NOTE — Transfer of Care (Signed)
Immediate Anesthesia Transfer of Care Note  Patient: Keiondre Colee Vaquerano  Procedure(s) Performed: RIGHT HIP INTRAMEDULLARY (IM) NAIL FEMORAL (Right )  Patient Location: PACU  Anesthesia Type:General  Level of Consciousness: drowsy  Airway & Oxygen Therapy: Patient Spontanous Breathing  Post-op Assessment: Report given to RN and Post -op Vital signs reviewed and stable  Post vital signs: Reviewed and stable     Last Vitals:  Vitals Value Taken Time  BP 143/75 11/16/19 1143  Temp    Pulse 101 11/16/19 1145  Resp 13 11/16/19 1145  SpO2 94 % 11/16/19 1145  Vitals shown include unvalidated device data.  Last Pain:  Vitals:   11/16/19 0800  TempSrc: Oral  PainSc:          Complications: No complications documented.

## 2019-11-16 NOTE — Consult Note (Signed)
Reason for Consult: Right hip pain Referring Physician:  [name] is an 86 y.o. male.  HPI: [name] is an 86 year old patient who ambulates with a walker at home with his wife who sustained a mechanical fall yesterday.  Presents now for operative management of intertrochanteric right hip fracture.  Denies any loss of consciousness or any other orthopedic complaints.  He does have a history of multiple strokes.  He takes 1 baby aspirin twice a day for that problem.  He was scheduled to have total knee replacement in the near future but that will have to be placed on hold.   Past Medical History:  Diagnosis Date  . 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 Colon polyps   . Coronary atherosclerosis of unspecified type of vessel, native or graft   . Diabetes ([place])   . Difficult intubation   . 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 Esophageal reflux   . High cholesterol   . History of IBS 02/27/2009  . Hypertension    pt denies, he says he has a h/o hypotension. If BP up he adjusts the [place]  . Jejunitis with partial SBO 05/20/2017  . Orthostatic hypotension    "BP has been dropping alot when I stand up for the last month or so" (02/17/2016)  . Prostatitis   . Upper GI bleed 06/24/2018  . Vitamin B 12 deficiency   . Vitamin D deficiency     Past Surgical History:  Procedure Laterality Date  . BIOPSY  06/25/2018   Procedure: BIOPSY;  Surgeon: Rush Landmark Telford Nab., MD;  Location: [facility];  Service: Gastroenterology;;  . BIOPSY  04/24/2019   Procedure: BIOPSY;  Surgeon: Irving Copas., MD;  Location: [facility];  Service: Gastroenterology;;  . BLADDER SURGERY  1969   traumatic pelvic fractures, urethral and bladder repair  . CARDIAC CATHETERIZATION N/A 07/01/2015   Procedure: Left Heart Cath and Coronary Angiography;  Surgeon: Wellington Hampshire, MD;  Location: [facility];  Service: Cardiovascular;  Laterality: N/A;  . COLON RESECTION N/A 05/17/2017   Procedure:  DIAGNOSTIC LAPAROSCOPY,;  Surgeon: Leighton Ruff, MD;  Location: [facility];  Service: General;  Laterality: N/A;  . COLONOSCOPY WITH PROPOFOL N/A 04/24/2019   Procedure: COLONOSCOPY WITH PROPOFOL;  Surgeon: Irving Copas., MD;  [facility];  Service: Gastroenterology;  Laterality: N/A;  . CORONARY ANGIOPLASTY WITH STENT PLACEMENT    . CORONARY ARTERY BYPASS GRAFT N/A 07/06/2015   Procedure: CORONARY ARTERY BYPASS GRAFTING (CABG)x 4   utilizing the left internal mammary artery and endoscopically harvested bilateral  sapheneous vein.;  Surgeon: Ivin Poot, MD;  Location: [facility];  Service: Open Heart Surgery;  Laterality: N/A;  . ESOPHAGEAL DILATION  04/24/2019   Procedure: ESOPHAGEAL DILATION;  Surgeon: Rush Landmark Telford Nab., MD;  [facility];  Service: Gastroenterology;;  . ESOPHAGOGASTRODUODENOSCOPY (EGD) WITH PROPOFOL N/A 06/25/2018   Procedure: ESOPHAGOGASTRODUODENOSCOPY (EGD) WITH PROPOFOL;  Surgeon: Irving Copas., MD;  Location: [facility];  Service: Gastroenterology;  Laterality: N/A;  . ESOPHAGOGASTRODUODENOSCOPY (EGD) WITH PROPOFOL N/A 04/24/2019   Procedure: ESOPHAGOGASTRODUODENOSCOPY (EGD) WITH PROPOFOL;  Surgeon: Rush Landmark Telford Nab., MD;  [facility];  Service: Gastroenterology;  Laterality: N/A;  . HEMOSTASIS CLIP PLACEMENT  04/24/2019   Procedure: HEMOSTASIS CLIP PLACEMENT;  Surgeon: Irving Copas., MD;  [facility];  Service: Gastroenterology;;  . KNEE SURGERY    . LOOP RECORDER INSERTION N/A 02/19/2018   Procedure: LOOP RECORDER INSERTION;  Surgeon: Deboraha Sprang, MD;  Location: [facility];  Service: Cardiovascular;  Laterality: N/A;  . POLYPECTOMY  04/24/2019   Procedure: POLYPECTOMY;  Surgeon: Rush Landmark Telford Nab., MD;  Location: [facility];  Service: Gastroenterology;;  . Lia Foyer LIFTING INJECTION  04/24/2019   Procedure: SUBMUCOSAL LIFTING INJECTION;  Surgeon: Irving Copas., MD;  [facility];  Service: Gastroenterology;;  . TEE WITHOUT CARDIOVERSION N/A 07/06/2015   Procedure: TRANSESOPHAGEAL ECHOCARDIOGRAM (TEE);  Surgeon: Ivin Poot, MD;  Location: [facility];  Service: Open Heart Surgery;  Laterality: N/A;    Family History  Problem Relation Age of Onset  . Heart attack Father        died age 52  . Heart attack Brother        died age 85  . Anuerysm Brother        aortic  . Heart attack Sister        died age 56  . Colon cancer Sister   . Liver cancer Sister   . Diabetes Maternal Grandmother   . Arthritis Mother   . Dementia Mother   . Heart Problems Brother   . Heart Problems Brother   . Heart Problems Brother   . Heart Problems Brother   . Healthy Daughter   . Healthy Son   . Esophageal cancer Neg Hx   . Inflammatory bowel disease Neg Hx   . Pancreatic cancer Neg Hx   . Stomach cancer Neg Hx     Social History:  reports that he quit smoking about 56 years ago. He quit smokeless tobacco use about 32 years ago.  His smokeless tobacco use included chew. He reports that he does not drink alcohol and does not use drugs.  Allergies:  Allergies  Allergen Reactions  . Other Other (See Comments)    Pt reports allergic to a medication but unsure of name or type of med  . Cymbalta [Duloxetine Hcl] Other (See Comments)    Dizziness, hallucinations.   . Keflex [Cephalexin] Other (See Comments)    dizziness  . Simvastatin Other (See Comments)    Joint pain  . Sudafed [Pseudoephedrine] Other (See Comments)    Dizziness    Medications: I have reviewed the patient's current medications.  Results for orders placed or performed during the hospital encounter of 11/15/19 (from the past 48 hour(s))  SARS Coronavirus 2 by RT PCR (hospital order, performed in [facility] hospital lab) Nasopharyngeal Nasopharyngeal Swab     Status: None   Collection Time: 11/15/19  3:30 PM   Specimen: Nasopharyngeal Swab  Result Value Ref Range   SARS  Coronavirus 2 NEGATIVE NEGATIVE    Comment: (NOTE) SARS-CoV-2 target nucleic acids are NOT DETECTED.  The SARS-CoV-2 RNA is generally detectable in upper and lower respiratory specimens during the acute phase of infection. The lowest concentration of SARS-CoV-2 viral copies this assay can detect is 250 copies / mL. A negative result does not preclude SARS-CoV-2 infection and should not be used as the sole basis for treatment or other patient management decisions.  A negative result may occur with improper specimen collection / handling, submission of specimen other than nasopharyngeal swab, presence of viral mutation(s) within the areas targeted by this assay, and inadequate number of viral copies (<250 copies / mL). A negative result must be combined with clinical observations, patient history, and epidemiological information.  Fact Sheet for Patients:   StrictlyIdeas.no  Fact Sheet for Healthcare Providers: BankingDealers.co.za  This test is not yet approved or  cleared  by the [place] and has been authorized for detection and/or diagnosis of SARS-CoV-2 by FDA under an Emergency Use Authorization (EUA).  This EUA will remain in effect (meaning this test can be used) for the duration of the COVID-19 declaration under Section 564(b)(1) of the Act, 21 U.S.C. section 360bbb-3(b)(1), unless the authorization is terminated or revoked sooner.  Performed at [facility], [address] [place], [place] [address]   Basic metabolic panel     Status: Abnormal   Collection Time: 11/15/19  3:42 PM  Result Value Ref Range   Sodium 142 135 - 145 mmol/L   Potassium 3.9 3.5 - 5.1 mmol/L   Chloride 106 98 - 111 mmol/L   CO2 28 22 - 32 mmol/L   Glucose, Bld 153 (H) 70 - 99 mg/dL    Comment: Glucose reference range applies only to samples taken after fasting for at least 8 hours.   BUN 23 8 - 23 mg/dL   Creatinine, Ser 1.44 (H) 0.61 -  1.24 mg/dL   Calcium 8.6 (L) 8.9 - 10.3 mg/dL   GFR calc non Af Amer 44 (L) >60 mL/min   GFR calc Af Amer 51 (L) >60 mL/min   Anion gap 8 5 - 15    Comment: Performed at [facility], [address], [place], [place] [address]  CBC WITH DIFFERENTIAL     Status: Abnormal   Collection Time: 11/15/19  3:42 PM  Result Value Ref Range   WBC 6.9 4.0 - 10.5 K/uL   RBC 3.63 (L) 4.22 - 5.81 MIL/uL   Hemoglobin 11.8 (L) 13.0 - 17.0 g/dL   HCT 37.3 (L) 39 - 52 %   MCV 102.8 (H) 80.0 - 100.0 fL   MCH 32.5 26.0 - 34.0 pg   MCHC 31.6 30.0 - 36.0 g/dL   RDW 13.8 11.5 - 15.5 %   Platelets 172 150 - 400 K/uL   nRBC 0.0 0.0 - 0.2 %   Neutrophils Relative % 64 %   Neutro Abs 4.5 1.7 - 7.7 K/uL   Lymphocytes Relative 23 %   Lymphs Abs 1.6 0.7 - 4.0 K/uL   Monocytes Relative 6 %   Monocytes Absolute 0.4 0 - 1 K/uL   Eosinophils Relative 5 %   Eosinophils Absolute 0.3 0 - 0 K/uL   Basophils Relative 1 %   Basophils Absolute 0.1 0 - 0 K/uL   Immature Granulocytes 1 %   Abs Immature Granulocytes 0.04 0.00 - 0.07 K/uL    Comment: Performed at [facility], [address] [place], [place] [address]  Protime-INR     Status: None   Collection Time: 11/15/19  3:42 PM  Result Value Ref Range   Prothrombin Time 13.6 11.4 - 15.2 seconds   INR 1.1 0.8 - 1.2    Comment: (NOTE) INR goal varies based on device and disease states. Performed at [facility], [address], [place], [place] [address]   Type and screen [facility]     Status: None   Collection Time: 11/15/19  3:42 PM  Result Value Ref Range   ABO/RH(D) B NEG    Antibody Screen NEG    Sample Expiration      11/18/2019,2359 Performed at [facility], [address] [place], [place] [address]   CBC     Status: Abnormal   Collection Time: 11/16/19 12:02 AM  Result Value Ref Range   WBC 13.7 (H) 4.0 - 10.5 K/uL   RBC 3.73 (  L) 4.22 - 5.81 MIL/uL   Hemoglobin 11.8 (L) 13.0 - 17.0 g/dL   HCT 38.2 (L) 39 - 52 %   MCV 102.4 (H) 80.0 -  100.0 fL   MCH 31.6 26.0 - 34.0 pg   MCHC 30.9 30.0 - 36.0 g/dL   RDW 13.5 11.5 - 15.5 %   Platelets 181 150 - 400 K/uL   nRBC 0.0 0.0 - 0.2 %    Comment: Performed at [facility] [address] [place], [place] [address]  Basic metabolic panel     Status: Abnormal   Collection Time: 11/16/19 12:02 AM  Result Value Ref Range   Sodium 142 135 - 145 mmol/L   Potassium 4.8 3.5 - 5.1 mmol/L   Chloride 106 98 - 111 mmol/L   CO2 28 22 - 32 mmol/L   Glucose, Bld 154 (H) 70 - 99 mg/dL    Comment: Glucose reference range applies only to samples taken after fasting for at least 8 hours.   BUN 21 8 - 23 mg/dL   Creatinine, Ser 1.53 (H) 0.61 - 1.24 mg/dL   Calcium 8.6 (L) 8.9 - 10.3 mg/dL   GFR calc non Af Amer 41 (L) >60 mL/min   GFR calc Af Amer 48 (L) >60 mL/min   Anion gap 8 5 - 15    Comment: Performed at [facility] [address], [place], [place] [address]  Type and screen [place]     Status: None   Collection Time: 11/16/19 12:07 AM  Result Value Ref Range   ABO/RH(D) B NEG    Antibody Screen NEG    Sample Expiration      11/19/2019,2359 Performed at [facility], [place] [address] [place], [place] [address]   MRSA PCR Screening     Status: None   Collection Time: 11/16/19  1:03 AM   Specimen: Nasal Mucosa; Nasopharyngeal  Result Value Ref Range   MRSA by PCR NEGATIVE NEGATIVE    Comment:        The GeneXpert MRSA Assay (FDA approved for NASAL specimens only), is one component of a comprehensive MRSA colonization surveillance program. It is not intended to diagnose MRSA infection nor to guide or monitor treatment for MRSA infections. Performed at [facility], [place] [address] [place], [place] [address]   Glucose, capillary     Status: Abnormal   Collection Time: 11/16/19  7:17 AM  Result Value Ref Range   Glucose-Capillary 143 (H) 70 - 99 mg/dL    Comment: Glucose reference range applies only to samples taken after  fasting for at least 8 hours.    DG Hip Unilat With Pelvis 2-3 Views Right  Result Date: 11/15/2019 CLINICAL DATA:  Fall, right hip pain EXAM: DG HIP (WITH OR WITHOUT PELVIS) 2-3V RIGHT COMPARISON:  None. FINDINGS: Comminuted intertrochanteric right hip fracture seen with mild Verus angulation and avulsion of the lesser trochanter. Femoral head is still seated within the right acetabulum. Limited evaluation of the left hip is unremarkable. The pelvis and sacrum appear intact. IMPRESSION: Comminuted intertrochanteric right hip fracture. Electronically Signed   By: Fidela Salisbury MD   On: 11/15/2019 15:35    Review of Systems  Musculoskeletal: Positive for arthralgias.  All other systems reviewed and are negative.  Blood pressure (!) 150/85, pulse 93, temperature 98.4 F (36.9 C), temperature source Oral, resp. rate 17, height 6' (1.829 m), weight 81.6 kg, SpO2 99 %. Physical Exam  HENT:     Head: Normocephalic.     Nose: Nose normal.     Mouth/Throat:     Mouth: Mucous membranes are moist.  Eyes:     Pupils: Pupils are equal, round, and reactive to light.  Cardiovascular:     Pulses: Normal pulses.  Pulmonary:     Effort: Pulmonary effort is normal.  Abdominal:     General: Abdomen is flat.  Skin:    General: Skin is warm.     Capillary Refill: Capillary refill takes less than 2 seconds.  Neurological:     Mental Status: He is alert. Mental status is at baseline.  Psychiatric:        Mood and Affect: Mood normal.   Examination bilateral upper extremities demonstrates no pain crepitus or swelling with range of motion actively or passively of the wrist elbows or shoulders.  Left lower extremity demonstrates no effusion in the knee or ankle.  Ankle dorsiflexion and toe dorsiflexion intact bilaterally.  Both feet are perfused.  Mild swelling in the right hip region is noted.  No effusion in the right knee.  Assessment/Plan: Impression is intertrochanteric right hip fracture.  Plan is  intramedullary hip screw.  Risk benefits are discussed including but not limited to infection nonunion malunion as well as perioperative events such as stroke and heart attack due to the stress of surgery.  Patient understands risk and benefits.  All questions answered.  [name] 11/16/2019, 8:32 AM

## 2019-11-16 NOTE — Plan of Care (Signed)
  Problem: Education: Goal: Verbalization of understanding the information provided (i.e., activity precautions, restrictions, etc) will improve Outcome: Progressing   Problem: Clinical Measurements: Goal: Postoperative complications will be avoided or minimized Outcome: Progressing   Problem: Self-Concept: Goal: Ability to maintain and perform role responsibilities to the fullest extent possible will improve Outcome: Progressing   Problem: Pain Management: Goal: Pain level will decrease Outcome: Progressing

## 2019-11-17 ENCOUNTER — Encounter (HOSPITAL_COMMUNITY): Payer: Self-pay | Admitting: Internal Medicine

## 2019-11-17 DIAGNOSIS — S72001S Fracture of unspecified part of neck of right femur, sequela: Secondary | ICD-10-CM | POA: Diagnosis not present

## 2019-11-17 LAB — CBC
HCT: 25.1 % — ABNORMAL LOW (ref 39.0–52.0)
Hemoglobin: 7.9 g/dL — ABNORMAL LOW (ref 13.0–17.0)
MCH: 32.5 pg (ref 26.0–34.0)
MCHC: 31.5 g/dL (ref 30.0–36.0)
MCV: 103.3 fL — ABNORMAL HIGH (ref 80.0–100.0)
Platelets: 142 10*3/uL — ABNORMAL LOW (ref 150–400)
RBC: 2.43 MIL/uL — ABNORMAL LOW (ref 4.22–5.81)
RDW: 13.6 % (ref 11.5–15.5)
WBC: 13.4 10*3/uL — ABNORMAL HIGH (ref 4.0–10.5)
nRBC: 0 % (ref 0.0–0.2)

## 2019-11-17 LAB — GLUCOSE, CAPILLARY
Glucose-Capillary: 141 mg/dL — ABNORMAL HIGH (ref 70–99)
Glucose-Capillary: 129 mg/dL — ABNORMAL HIGH (ref 70–99)
Glucose-Capillary: 136 mg/dL — ABNORMAL HIGH (ref 70–99)
Glucose-Capillary: 116 mg/dL — ABNORMAL HIGH (ref 70–99)

## 2019-11-17 LAB — BASIC METABOLIC PANEL
Anion gap: 6 (ref 5–15)
BUN: 26 mg/dL — ABNORMAL HIGH (ref 8–23)
CO2: 26 mmol/L (ref 22–32)
Calcium: 8.1 mg/dL — ABNORMAL LOW (ref 8.9–10.3)
Chloride: 106 mmol/L (ref 98–111)
Creatinine, Ser: 1.53 mg/dL — ABNORMAL HIGH (ref 0.61–1.24)
GFR calc Af Amer: 48 mL/min — ABNORMAL LOW (ref >60)
GFR calc non Af Amer: 41 mL/min — ABNORMAL LOW (ref >60)
Glucose, Bld: 157 mg/dL — ABNORMAL HIGH (ref 70–99)
Potassium: 4.2 mmol/L (ref 3.5–5.1)
Sodium: 138 mmol/L (ref 135–145)

## 2019-11-17 LAB — HEMOGLOBIN AND HEMATOCRIT, BLOOD
HCT: 22.7 % — ABNORMAL LOW (ref 39.0–52.0)
Hemoglobin: 7 g/dL — ABNORMAL LOW (ref 13.0–17.0)

## 2019-11-17 LAB — PREPARE RBC (CROSSMATCH)

## 2019-11-17 MED ORDER — ACETAMINOPHEN 325 MG PO TABS
650.0000 mg | ORAL_TABLET | Freq: Four times a day (QID) | ORAL | Status: DC | PRN
  Filled 2019-11-17: qty 2

## 2019-11-17 MED ORDER — RIVAROXABAN 10 MG PO TABS: 10.0000 mg | ORAL_TABLET | Freq: Every day | ORAL | Status: DC

## 2019-11-17 MED ORDER — ASPIRIN EC 81 MG PO TBEC
81.0000 mg | DELAYED_RELEASE_TABLET | Freq: Every day | ORAL | Status: DC
  Administered 2019-11-18: 81 mg via ORAL
  Filled 2019-11-17: qty 1

## 2019-11-17 MED ORDER — ACETAMINOPHEN 500 MG PO TABS
1000.0000 mg | ORAL_TABLET | Freq: Four times a day (QID) | ORAL | Status: DC | PRN
  Administered 2019-11-17 – 2019-11-18 (×3): 1000 mg via ORAL
  Filled 2019-11-17 (×3): qty 2

## 2019-11-17 MED ORDER — SODIUM CHLORIDE 0.9% IV SOLUTION: Freq: Once | INTRAVENOUS | Status: AC

## 2019-11-17 NOTE — Social Work (Signed)
CSW acknowledging consult for SNF placement/HH/DME. Will follow for therapy recommendations needed to best determine disposition/for insurance authorization.   Sharilynn Cassity, MSW, LCSW [facility] Social Work    

## 2019-11-17 NOTE — Evaluation (Signed)
Physical Therapy Evaluation Patient Details Name: [name] MRN: [id number] DOB: 11-04-1933 Today's Date: 11/17/2019   History of Present Illness  [name] is a 86 y.o. male with a history of diet-controlled type 2 diabetes with stage III chronic kidney disease and diabetic neuropathy, coronary artery disease status post bypass, CVA.  Pt presents with a fall and subsequent right hip intertrochanteric fracture. Now s/p IMHS nailing 11/16/2019.  Clinical Impression  Prior to admission, pt lives with his wife, is a limited household ambulator with a walker vs cane and requires assist with ADL's. Pt with decreased functional mobility secondary to acute confusion, RLE pain, weakness, and balance deficits. Requiring two person maximal assist for bed mobility. Transfer to chair deferred as pt with decreased level of arousal and ability to command follow. Will continue to follow acutely to progress as tolerated.     Follow Up Recommendations SNF    Equipment Recommendations  Other (comment) (defer)    Recommendations for Other Services       Precautions / Restrictions Precautions Precautions: Fall Restrictions Weight Bearing Restrictions: Yes RLE Weight Bearing: Touchdown weight bearing Other Position/Activity Restrictions: For transfers only      Mobility  Bed Mobility Overal bed mobility: Needs Assistance Bed Mobility: Supine to Sit;Sit to Supine     Supine to sit: Max assist;+2 for physical assistance Sit to supine: Max assist;+2 for physical assistance   General bed mobility comments: MaxA + 2 for supine <> sit, assist for trunk and BLE negotiation off edge of bed. Pt pulling up on therapist to sit upright  Transfers                 General transfer comment: deferred due to level of arousal/mentation  Ambulation/Gait                Stairs            Wheelchair Mobility    Modified Rankin (Stroke Patients Only)       Balance Overall  balance assessment: Needs assistance Sitting-balance support: Feet supported;Bilateral upper extremity supported Sitting balance-Leahy Scale: Poor Sitting balance - Comments: Close min guard, reliant on external support                                     Pertinent Vitals/Pain Pain Assessment: Faces Faces Pain Scale: Hurts even more Pain Location: R hip Pain Descriptors / Indicators: Grimacing;Operative site guarding;Sore Pain Intervention(s): Limited activity within patient's tolerance;Monitored during session    [place] expects to be discharged to:: Private residence Living Arrangements: Spouse/significant other Available Help at Discharge: Family Type of Home: House Home Access: Stairs to enter;Ramped entrance   Entrance Stairs-Number of Steps: [place]: One [place]: Environmental consultant - 2 wheels;Cane - single point;Electric scooter;Transport chair;Bedside commode;Shower seat      Prior Function Level of Independence: Needs assistance   Gait / Transfers Assistance Needed: limited household ambulator, uses walker vs cane, utilizes transport chair vs electric scooter for community distances  ADL's / Homemaking Assistance Needed: assist with ADL's        Hand Dominance        Extremity/Trunk Assessment   Upper Extremity Assessment Upper Extremity Assessment: Defer to OT evaluation    Lower Extremity Assessment Lower Extremity Assessment: RLE deficits/detail RLE Deficits / Details: right hip fracture s/p IMHS nailing, grossly weak post op, difficult to assess due to  pain       Communication   Communication: No difficulties  Cognition Arousal/Alertness: Lethargic Behavior During Therapy: WFL for tasks assessed/performed Overall Cognitive Status: Impaired/Different from baseline Area of Impairment: Orientation;Safety/judgement;Following commands;Attention;Awareness;Problem solving                 Orientation Level:  Disoriented to;Time;Place;Situation Current Attention Level: Focused   Following Commands: Follows one step commands inconsistently Safety/Judgement: Decreased awareness of safety;Decreased awareness of deficits Awareness: Intellectual Problem Solving: Decreased initiation;Requires verbal cues General Comments: Pt oriented to self only, stating he was at home. Drowsy throughout session, keeping eyes closed. Pt following ~25% of 1 step commands with max repetition and stimulation      General Comments      Exercises     Assessment/Plan    PT Assessment Patient needs continued PT services  PT Problem List Decreased strength;Decreased activity tolerance;Decreased balance;Decreased mobility;Decreased range of motion;Decreased cognition;Decreased safety awareness;Impaired sensation;Pain       PT Treatment Interventions DME instruction;Functional mobility training;Therapeutic exercise;Therapeutic activities;Balance training;Wheelchair mobility training;Patient/family education    PT Goals (Current goals can be found in the Care Plan section)  Acute Rehab PT Goals Patient Stated Goal: pt wife would like him to go to CIR PT Goal Formulation: With patient/family Time For Goal Achievement: 12/01/19 Potential to Achieve Goals: Fair    Frequency Min 3X/week   Barriers to discharge        Co-evaluation               AM-PAC PT "6 Clicks" Mobility  Outcome Measure Help needed turning from your back to your side while in a flat bed without using bedrails?: A Lot Help needed moving from lying on your back to sitting on the side of a flat bed without using bedrails?: Total Help needed moving to and from a bed to a chair (including a wheelchair)?: Total Help needed standing up from a chair using your arms (e.g., wheelchair or bedside chair)?: Total Help needed to walk in hospital room?: Total Help needed climbing 3-5 steps with a railing? : Total 6 Click Score: 7    End of  Session   Activity Tolerance: Patient limited by lethargy Patient left: in bed;with call bell/phone within reach;with bed alarm set;with family/visitor present   PT Visit Diagnosis: Pain;Other abnormalities of gait and mobility (R26.89) Pain - Right/Left: Right Pain - part of body: Hip    Time: 5790-3833 PT Time Calculation (min) (ACUTE ONLY): 23 min   Charges:   PT Evaluation $PT Eval Moderate Complexity: 1 Mod PT Treatments $Therapeutic Activity: 8-22 mins          Wyona Almas, PT, DPT Acute Rehabilitation Services Pager [phone] Office [phone]   Deno Etienne 11/17/2019, 11:20 AM

## 2019-11-17 NOTE — Progress Notes (Signed)
Patient stable Hemoglobin 7.  Patient had acute blood loss anemia Ankle dorsiflexion plantarflexion intact on the right.  Mild swelling in the proximal right thigh Plan to use CPM just to facilitate motion. Social work consult for skilled nursing placement

## 2019-11-17 NOTE — Progress Notes (Signed)
PROGRESS NOTE  [name]:[id number] DOB: [birth year] DOA: 11/15/2019 PCP: [name], MD  HPI/Recap of past 24 hours: [name] a 86 y.o.malewith a history of diet-controlled type 2 diabetes with stage III chronic kidney disease and diabetic neuropathy, coronary artery disease status post bypass,CVA.Patient presents with right hip pain after a mechanical fall with the patient tripping as he was trying to get out of the car. Pain started immediately after the fall and is worse with movement and improved with rest. EMS was called and the patient was brought to the emergency department for evaluation.  X-rays show a right intertrochanteric hip fracture.  POD #1 post right hip repair on 11/16/2019.  On Xarelto for DVT prophylaxis.  11/17/19: Seen and examined at his bedside.  Drowsy and confused.  His wife at bedside.  Per his wife he has had multiple strokes and a progressive decline in his cognitive abilities.  Acute blood loss noted this morning with hemoglobin down to 7.0 from 11.8 presurgically.  Will hold off Xarelto and transfuse 1 unit PRBC and closely monitor H&H.   Assessment/Plan: Principal Problem:   Closed right hip fracture (HCC) Active Problems:   GERD   Supine hypertension   PVD (peripheral vascular disease) (HCC)   Coronary artery disease involving coronary bypass graft of native heart without angina pectoris   Diabetes mellitus type 2, diet-controlled ([name])   CKD stage 3 due to type 2 diabetes mellitus (HCC)   Dysautonomia orthostatic hypotension syndrome (HCC)   Diabetic neuropathy ([name])   History of lacunar cerebrovascular accident (CVA)   Closed right hip fracture post surgical repair on 11/16/2019 by Dr. Marlou Sa S/p Rt hip IM nail palcment  Pain management in place Management per orthopedic surgery PT assessment recommended SNF TOC assisting with SNF placement  Acute blood loss anemia post surgery Baseline hemoglobin appears to  be 11.8 Drop in hemoglobin this morning 7.9, repeated H&H 7.0 Transfuse 1 unit PRBC Hold off Xarelto Repeat H&H at 1600 Maintain hemoglobin greater than 8.0.  Acute metabolic encephalopathy likely multifactorial in the setting of prior CVA, cognitive decline, anesthetic residuals Drowsy and confused this morning Reorient as needed Fall precaution Continuous pulse oximetry  Prediabetes with hyperglycemia likely exacerbated by chronic steroid use  Diet controlled On Florinef daily Hemoglobin A1c 6.1 Continue insulin sliding scale as needed Avoid hypoglycemia  Dysautonomia orthostatic hypotension syndrome On Florinef daily Blood pressure is stable Maintain MAP greater than 65 Continue to monitor vital sign  CKD stage IIIb Baseline creatinine appears to be 1.4 with GFR 44 Creatinine uptrending 1.53 with GFR of 41 Avoid nephrotoxins Monitor urine output Repeat BMP in the morning  History of lacunar CVA Continue aspirin and Pravachol  Coronary artery disease status post CABG Denies any anginal symptoms at the time of this visit Continue statin, aspirin  Ambulatory dysfunction post right hip repair PT recommended SNF TOC assisting with SNF placement Continue PT OT with assistance and fall precautions    DVT prophylaxis: ASA 162 mg daily, SCDs Code Status: DNR Family Communication: None at bedside Disposition Plan: SNF Status is: Inpatient  Remains inpatient appropriate because:Ongoing active pain requiring inpatient pain management   Dispo: The patient is from: Home  Anticipated d/c is to: SNF  Anticipated d/c date is: 11/19/2019  Patient currently is not medically stable to d/c due to acute blood loss anemia requiring PRBC transfusion.  Pending SNF placement.         Objective: Vitals:   11/16/19 1915 11/16/19  2120 11/16/19 2337 11/17/19 0452  BP: (!) 175/115 (!) 145/80 138/84 132/89  Pulse: 79 80 78 82    Resp: 16  16 16   Temp: 98 F (36.7 C)  98.3 F (36.8 C) 98.1 F (36.7 C)  TempSrc: Oral  Oral Oral  SpO2: 91%  93% 96%  Weight:      Height:        Intake/Output Summary (Last 24 hours) at 11/17/2019 0716 Last data filed at 11/17/2019 0452 Gross per 24 hour  Intake 1040 ml  Output 450 ml  Net 590 ml   Filed Weights   11/15/19 1405  Weight: 81.6 kg    Exam:  . General: 86 y.o. year-old male well developed well nourished in no acute distress.  Drowsy and confused. . Cardiovascular: Regular rate and rhythm with no rubs or gallops.  No thyromegaly or JVD noted.   Marland Kitchen Respiratory: Clear to auscultation with no wheezes or rales. Good inspiratory effort. . Abdomen: Soft nontender nondistended with normal bowel sounds x4 quadrants. . Musculoskeletal: Right lateral thigh edematous.  No bruising or erythema noted . Psychiatry: Unable to assess mood due to somnolence.   Data Reviewed: CBC: Recent Labs  Lab 11/15/19 1542 11/16/19 0002 11/17/19 0220  WBC 6.9 13.7* 13.4*  NEUTROABS 4.5  --   --   HGB 11.8* 11.8* 7.9*  HCT 37.3* 38.2* 25.1*  MCV 102.8* 102.4* 103.3*  PLT 172 181 098*   Basic Metabolic Panel: Recent Labs  Lab 11/15/19 1542 11/16/19 0002 11/17/19 0220  NA 142 142 138  K 3.9 4.8 4.2  CL 106 106 106  CO2 28 28 26   GLUCOSE 153* 154* 157*  BUN 23 21 26*  CREATININE 1.44* 1.53* 1.53*  CALCIUM 8.6* 8.6* 8.1*   GFR: Estimated Creatinine Clearance: 39.4 mL/min (A) (by C-G formula based on SCr of 1.53 mg/dL (H)). Liver Function Tests: No results for input(s): AST, ALT, ALKPHOS, BILITOT, PROT, ALBUMIN in the last 168 hours. No results for input(s): LIPASE, AMYLASE in the last 168 hours. No results for input(s): AMMONIA in the last 168 hours. Coagulation Profile: Recent Labs  Lab 11/15/19 1542  INR 1.1   Cardiac Enzymes: No results for input(s): CKTOTAL, CKMB, CKMBINDEX, TROPONINI in the last 168 hours. BNP (last 3 results) No results for input(s):  PROBNP in the last 8760 hours. HbA1C: No results for input(s): HGBA1C in the last 72 hours. CBG: Recent Labs  Lab 11/16/19 1151 11/16/19 1238 11/16/19 1634 11/16/19 2012 11/17/19 0627  GLUCAP 164* 158* 202* 178* 141*   Lipid Profile: No results for input(s): CHOL, HDL, LDLCALC, TRIG, CHOLHDL, LDLDIRECT in the last 72 hours. Thyroid Function Tests: No results for input(s): TSH, T4TOTAL, FREET4, T3FREE, THYROIDAB in the last 72 hours. Anemia Panel: No results for input(s): VITAMINB12, FOLATE, FERRITIN, TIBC, IRON, RETICCTPCT in the last 72 hours. Urine analysis:    Component Value Date/Time   COLORURINE YELLOW 09/11/2019 [place] 09/11/2019 1652   LABSPEC 1.020 09/11/2019 1652   PHURINE 5.0 09/11/2019 1652   GLUCOSEU NEGATIVE 09/11/2019 1652   HGBUR NEGATIVE 09/11/2019 1652   BILIRUBINUR NEGATIVE 09/11/2019 1652   KETONESUR NEGATIVE 09/11/2019 1652   PROTEINUR NEGATIVE 09/11/2019 1652   UROBILINOGEN 1.0 01/05/2015 1550   NITRITE NEGATIVE 09/11/2019 1652   LEUKOCYTESUR NEGATIVE 09/11/2019 1652   Sepsis Labs: @LABRCNTIP (procalcitonin:4,lacticidven:4)  ) Recent Results (from the past 240 hour(s))  SARS Coronavirus 2 by RT PCR (hospital order, performed in [facility] hospital lab) Nasopharyngeal Nasopharyngeal  Swab     Status: None   Collection Time: 11/15/19  3:30 PM   Specimen: Nasopharyngeal Swab  Result Value Ref Range Status   SARS Coronavirus 2 NEGATIVE NEGATIVE Final    Comment: (NOTE) SARS-CoV-2 target nucleic acids are NOT DETECTED.  The SARS-CoV-2 RNA is generally detectable in upper and lower respiratory specimens during the acute phase of infection. The lowest concentration of SARS-CoV-2 viral copies this assay can detect is 250 copies / mL. A negative result does not preclude SARS-CoV-2 infection and should not be used as the sole basis for treatment or other patient management decisions.  A negative result may occur with improper  specimen collection / handling, submission of specimen other than nasopharyngeal swab, presence of viral mutation(s) within the areas targeted by this assay, and inadequate number of viral copies (<250 copies / mL). A negative result must be combined with clinical observations, patient history, and epidemiological information.  Fact Sheet for Patients:   StrictlyIdeas.no  Fact Sheet for Healthcare Providers: BankingDealers.co.za  This test is not yet approved or  cleared by the [place] FDA and has been authorized for detection and/or diagnosis of SARS-CoV-2 by FDA under an Emergency Use Authorization (EUA).  This EUA will remain in effect (meaning this test can be used) for the duration of the COVID-19 declaration under Section 564(b)(1) of the Act, 21 U.S.C. section 360bbb-3(b)(1), unless the authorization is terminated or revoked sooner.  Performed at [facility], [address], [place], [place] [address]   MRSA PCR Screening     Status: None   Collection Time: 11/16/19  1:03 AM   Specimen: Nasal Mucosa; Nasopharyngeal  Result Value Ref Range Status   MRSA by PCR NEGATIVE NEGATIVE Final    Comment:        The GeneXpert MRSA Assay (FDA approved for NASAL specimens only), is one component of a comprehensive MRSA colonization surveillance program. It is not intended to diagnose MRSA infection nor to guide or monitor treatment for MRSA infections. Performed at [facility], [place] [address] [place], [place] [address]       Studies: Pelvis Portable  Result Date: 11/16/2019 CLINICAL DATA:  Right hip surgery. EXAM: PORTABLE PELVIS 1-2 VIEWS COMPARISON:  Yesterday. FINDINGS: Interval intramedullary rod and screw fixation of the previously demonstrated comminuted right intertrochanteric fracture. Essentially anatomic position and alignment of the major fragments. The lesser trochanter fragment remains medially displaced.  IMPRESSION: Hardware fixation of the previously demonstrated comminuted right intertrochanteric fracture. Electronically Signed   By: Claudie Revering M.D.   On: 11/16/2019 14:07   DG C-Arm 1-60 Min  Result Date: 11/16/2019 CLINICAL DATA:  Right hip fracture. EXAM: DG C-ARM 1-60 MIN; DG HIP (WITH OR WITHOUT PELVIS) 2-3V RIGHT COMPARISON:  11/15/2019 FINDINGS: Eighteen C-arm views of the right hip and femur demonstrate intramedullary rod and compression screw fixation of the previously demonstrated comminuted right intertrochanteric fracture. Anatomic position and alignment on the later images. IMPRESSION: Hardware fixation of the previously demonstrated right intertrochanteric fracture. Electronically Signed   By: Claudie Revering M.D.   On: 11/16/2019 14:06   DG HIP UNILAT WITH PELVIS 2-3 VIEWS RIGHT  Result Date: 11/16/2019 CLINICAL DATA:  Right hip fracture. EXAM: DG C-ARM 1-60 MIN; DG HIP (WITH OR WITHOUT PELVIS) 2-3V RIGHT COMPARISON:  11/15/2019 FINDINGS: Eighteen C-arm views of the right hip and femur demonstrate intramedullary rod and compression screw fixation of the previously demonstrated comminuted right intertrochanteric fracture. Anatomic position and alignment on the later images.  IMPRESSION: Hardware fixation of the previously demonstrated right intertrochanteric fracture. Electronically Signed   By: Claudie Revering M.D.   On: 11/16/2019 14:06    Scheduled Meds: . acetaminophen  1,000 mg Oral Q6H  . [START ON 11/18/2019] aspirin EC  162 mg Oral Daily  . docusate sodium  100 mg Oral BID  . fludrocortisone  0.1 mg Oral Daily  . insulin aspart  0-5 Units Subcutaneous QHS  . insulin aspart  0-9 Units Subcutaneous TID WC  . pantoprazole  40 mg Oral Daily  . polyethylene glycol  17 g Oral Daily  . pravastatin  40 mg Oral q1800  . pregabalin  100 mg Oral TID  . rivaroxaban  10 mg Oral Q supper    Continuous Infusions: . sodium chloride 100 mL/hr at 11/16/19 2127  . methocarbamol (ROBAXIN) IV        LOS: 2 days     Kayleen Memos, MD [facility] Pager [phone]  If 7PM-7AM, please contact night-coverage [email or web address] Password [facility] 11/17/2019, 7:16 AM

## 2019-11-17 NOTE — Evaluation (Signed)
Occupational Therapy Evaluation Patient Details Name: [name] MRN: [id number] DOB: [birth year] Today's Date: 11/17/2019    History of Present Illness RAYLEN TANGONAN is a 86 y.o. male with a history of diet-controlled type 2 diabetes with stage III chronic kidney disease and diabetic neuropathy, coronary artery disease status post bypass, CVA.  Pt presents with a fall and subsequent right hip intertrochanteric fracture. Now s/p IMHS nailing 11/16/2019.   Clinical Impression   Patient with functional deficits listed below impacting safety and independence with self care. Patient initially groggy with difficulty keeping eyes open, supervision to wash face with mod cues for thoroughness. Max A bed mob with max cues for sequencing, guiding R LE to EOB and for trunk support. Patient maintain sitting at EOB for ~2 mins before stating "i'm going to fall back" due to fatigue. Attempt to have patient scoot hips to [facility] requiring max A x2. Cues for sequencing and max A x2 to guide trunk and lift LEs onto bed. Currently recommending venue listed below due to level of assist and cognition, however will continue to assess for possible CIR as it is spouse's wish for patient to go there for rehab.     Follow Up Recommendations  SNF;Supervision/Assistance - 24 hour    Equipment Recommendations  Other (comment) (TBD)       Precautions / Restrictions Precautions Precautions: Fall Restrictions Weight Bearing Restrictions: Yes RLE Weight Bearing: Touchdown weight bearing Other Position/Activity Restrictions: For transfers only      Mobility Bed Mobility Overal bed mobility: Needs Assistance Bed Mobility: Supine to Sit;Sit to Supine     Supine to sit: Max assist;HOB elevated Sit to supine: Max assist;+2 for physical assistance;+2 for safety/equipment   General bed mobility comments: increased time and max cues to sequence to EOB with assist R LE and trunk support max A, max A x2 to guide  trunk and lift LEs back onto bed   Transfers                 General transfer comment: deferred due to assist x1, still somewhat groggy at edge of bed and limited sitting tolerance "I'm going to fall back"    Balance Overall balance assessment: Needs assistance Sitting-balance support: Feet supported;Bilateral upper extremity supported Sitting balance-Leahy Scale: Fair Sitting balance - Comments: patient able to maintain sitting balance supervision - min guard for approx 2 mins before reports fatigue                                    ADL either performed or assessed with clinical judgement   ADL Overall ADL's : Needs assistance/impaired     Grooming: Supervision/safety;Wash/dry face;Bed level Grooming Details (indicate cue type and reason): mod cues for thoroughness  Upper Body Bathing: Moderate assistance;Bed level;Sitting   Lower Body Bathing: Total assistance;Sitting/lateral leans;Bed level   Upper Body Dressing : Moderate assistance;Sitting;Bed level   Lower Body Dressing: Total assistance;Sitting/lateral leans;Bed level     Toilet Transfer Details (indicate cue type and reason): did not attempt due to grogginess and assist x1 available and decreased sitting tolerance at EOB Toileting- Clothing Manipulation and Hygiene: Total assistance;Bed level       Functional mobility during ADLs: Maximal assistance (to EOB) General ADL Comments: patient requiring increased assistance with self care due to decreased cognition, safety awareness, activity tolerance, balance,  Pertinent Vitals/Pain Pain Assessment: Faces Faces Pain Scale: Hurts even more Pain Location: R hip Pain Descriptors / Indicators: Grimacing;Operative site guarding;Sore Pain Intervention(s): Monitored during session     Hand Dominance  (did not specify)   Extremity/Trunk Assessment Upper Extremity Assessment Upper Extremity Assessment: Generalized weakness    Lower Extremity Assessment Lower Extremity Assessment: Defer to PT evaluation       Communication Communication Communication: Expressive difficulties;Other (comment) (groggy)   Cognition Arousal/Alertness: Awake/alert;Lethargic (increased arousal with sitting EOB) Behavior During Therapy: WFL for tasks assessed/performed Overall Cognitive Status: Impaired/Different from baseline Area of Impairment: Orientation;Safety/judgement;Following commands;Attention;Problem solving                 Orientation Level: Disoriented to;Time;Situation (cant state in hospital, not name of place) Current Attention Level: Sustained   Following Commands: Follows one step commands with increased time Safety/Judgement: Decreased awareness of safety;Decreased awareness of deficits   Problem Solving: Slow processing;Difficulty sequencing;Requires verbal cues;Requires tactile cues                Home Living Family/patient expects to be discharged to:: Private residence Living Arrangements: Spouse/significant other Available Help at Discharge: Family Type of Home: House Home Access: Stairs to enter;Ramped entrance Entrance Stairs-Number of Steps: 3   Home Layout: One level     Bathroom Shower/Tub: Tub/shower unit         Home Equipment: Environmental consultant - 2 wheels;Cane - single point;Electric scooter;Transport chair;Bedside commode;Shower seat          Prior Functioning/Environment Level of Independence: Needs assistance  Gait / Transfers Assistance Needed: limited household ambulator, uses walker vs cane, utilizes transport chair vs electric scooter for community distances ADL's / Homemaking Assistance Needed: assist with ADL's            OT Problem List: Decreased strength;Decreased activity tolerance;Impaired balance (sitting and/or standing);Decreased cognition;Decreased safety awareness;Decreased knowledge of use of DME or AE;Pain      OT Treatment/Interventions: Self-care/ADL  training;Therapeutic exercise;Energy conservation;DME and/or AE instruction;Therapeutic activities;Cognitive remediation/compensation;Patient/family education;Balance training    OT Goals(Current goals can be found in the care plan section) Acute Rehab OT Goals Patient Stated Goal: pt wife would like him to go to CIR OT Goal Formulation: With family Time For Goal Achievement: 12/01/19 Potential to Achieve Goals: Good  OT Frequency: Min 2X/week    AM-PAC OT "6 Clicks" Daily Activity     Outcome Measure Help from another person eating meals?: A Little Help from another person taking care of personal grooming?: A Little Help from another person toileting, which includes using toliet, bedpan, or urinal?: Total Help from another person bathing (including washing, rinsing, drying)?: A Lot Help from another person to put on and taking off regular upper body clothing?: A Lot Help from another person to put on and taking off regular lower body clothing?: Total 6 Click Score: 12   End of Session Nurse Communication: Mobility status  Activity Tolerance: Patient limited by pain;Patient limited by fatigue Patient left: in bed;with call bell/phone within reach;with bed alarm set;with nursing/sitter in room;with family/visitor present  OT Visit Diagnosis: Other abnormalities of gait and mobility (R26.89);History of falling (Z91.81);Muscle weakness (generalized) (M62.81);Pain Pain - Right/Left: Right Pain - part of body: Hip                Time: 3149-7026 OT Time Calculation (min): 31 min Charges:  OT General Charges $OT Visit: 1 Visit OT Evaluation $OT Eval Moderate Complexity: 1 Mod OT Treatments $Self Care/Home Management : 8-22 mins  Delbert Phenix OT OT office: [place] 11/17/2019, 3:00 PM

## 2019-11-17 NOTE — NC FL2 (Signed)
[place] LEVEL OF CARE SCREENING TOOL     IDENTIFICATION  Patient Name: [name] Birthdate: [birth year] Sex: male Admission Date (Current Location): 11/15/2019  [facility] and [place] Number:  Herbalist and Address:  [facility] [place], [place] [address], [place], [place] [address]      Provider Number: [id number]  Attending Physician Name and Address:  Kayleen Memos, DO  Relative Name and Phone Number:       Current Level of Care: Hospital Recommended Level of Care: [place] Prior Approval Number:    Date Approved/Denied:   PASRR Number: [id number]  Discharge Plan: SNF    Current Diagnoses: Patient Active Problem List   Diagnosis Date Noted  . Closed right hip fracture ([place]) 11/15/2019  . Unilateral primary osteoarthritis, right knee 11/13/2019  . At high risk for falls 05/29/2019  . Chronic bilateral low back pain without sciatica 02/07/2019  . Statin intolerance 11/16/2018  . Hyperlipidemia, mixed 08/15/2018  . Low back pain 07/18/2018  . History of lacunar cerebrovascular accident (CVA) 02/12/2018  . Dizziness 02/12/2018  . Myofascial pain 12/18/2017  . Spondylosis without myelopathy or radiculopathy, lumbar region 12/18/2017  . Lumbar radiculopathy, right 12/18/2017  . Cholelithiasis 05/17/2017  . Sinus Bradycardia 05/17/2017  . Thoracic radiculopathy 04/12/2017  . Primary osteoarthritis of right knee 12/14/2016  . DDD (degenerative disc disease), lumbar 10/10/2016  . Lumbar facet arthropathy 08/29/2016  . Idiopathic scoliosis 03/22/2016  . Diabetic neuropathy ([place]) 03/22/2016  . Dysautonomia orthostatic hypotension syndrome ([place]) 02/25/2016  . CKD stage 3 due to type 2 diabetes mellitus ([place]) 02/19/2016  . Coronary artery disease involving coronary bypass graft of native heart without angina pectoris   . Diabetes mellitus type 2, diet-controlled ([place])   . 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  Medication management 08/11/2015  . 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 PVD (peripheral vascular disease) ([place]) 01/01/2014  . Hyperlipidemia associated with type 2 diabetes mellitus ([place]) 04/11/2013  . Supine hypertension   . Vitamin D deficiency   . Vitamin B 12 deficiency   . Coronary atherosclerosis- s/p PCI to LAD in 2009 and PCI to RCA in 2011 02/27/2009  . Abdominal aortic aneurysm ([place]) 02/27/2009  . GERD 02/27/2009    Orientation RESPIRATION BLADDER Height & Weight     Self, Place, Situation  Normal Continent Weight: 180 lb (81.6 kg) Height:  6' (182.9 cm)  BEHAVIORAL SYMPTOMS/MOOD NEUROLOGICAL BOWEL NUTRITION STATUS      Continent Diet (see discharge summary)  AMBULATORY STATUS COMMUNICATION OF NEEDS Skin   Extensive Assist Verbally Surgical wounds, Skin abrasions (abrasion R elbow; closed incision on R hip with hydrofiber and aquacel)                       Personal Care Assistance Level of Assistance  Bathing, Feeding, Dressing Bathing Assistance: Maximum assistance Feeding assistance: Independent Dressing Assistance: Maximum assistance     Functional Limitations Info  Sight, Hearing, Speech Sight Info: Adequate Hearing Info: Adequate Speech Info: Adequate    SPECIAL CARE FACTORS FREQUENCY                       Contractures Contractures Info: Not present    Additional Factors Info  Code Status, Allergies, Insulin Sliding Scale Code Status Info: DNR Allergies Info: Cymbalta (Duloxetine Hcl), Keflex (Cephalexin), Simvastatin, Sudafed (Pseudoephedrine)   Insulin Sliding Scale Info: insulin aspart (novoLOG) injection 0-5 Units daily  at bedtime; insulin aspart (novoLOG) injection 0-9 Units 3x daily with meals       Current Medications (11/17/2019):  This is the current hospital active medication list Current Facility-Administered Medications  Medication Dose Route Frequency Provider Last Rate Last Admin  . 0.9 %  sodium chloride infusion   Intravenous  Continuous Kayleen Memos, DO 50 mL/hr at 11/17/19 0844 Rate Change at 11/17/19 0844  . acetaminophen (TYLENOL) tablet 1,000 mg  1,000 mg Oral Q6H Magnant, Charles L, PA-C   1,000 mg at 11/17/19 7169  . [START ON 11/18/2019] aspirin EC tablet 162 mg  162 mg Oral Daily Truett Mainland, DO      . docusate sodium (COLACE) capsule 100 mg  100 mg Oral BID Magnant, Charles L, PA-C   100 mg at 11/16/19 2119  . fludrocortisone (FLORINEF) tablet 0.1 mg  0.1 mg Oral Daily Truett Mainland, DO      . HYDROmorphone (DILAUDID) injection 0.5 mg  0.5 mg Intravenous Q4H PRN Magnant, Charles L, PA-C      . insulin aspart (novoLOG) injection 0-5 Units  0-5 Units Subcutaneous QHS Stinson, Jacob J, DO      . insulin aspart (novoLOG) injection 0-9 Units  0-9 Units Subcutaneous TID WC Truett Mainland, DO   1 Units at 11/17/19 [phone]  . menthol-cetylpyridinium (CEPACOL) lozenge 3 mg  1 lozenge Oral PRN Magnant, Charles L, PA-C       Or  . phenol (CHLORASEPTIC) mouth spray 1 spray  1 spray Mouth/Throat PRN Magnant, Charles L, PA-C      . methocarbamol (ROBAXIN) tablet 500 mg  500 mg Oral Q6H PRN Magnant, Charles L, PA-C       Or  . methocarbamol (ROBAXIN) 500 mg in dextrose 5 % 50 mL IVPB  500 mg Intravenous Q6H PRN Magnant, Charles L, PA-C      . metoCLOPramide (REGLAN) tablet 5-10 mg  5-10 mg Oral Q8H PRN Magnant, Charles L, PA-C       Or  . metoCLOPramide (REGLAN) injection 5-10 mg  5-10 mg Intravenous Q8H PRN Magnant, Charles L, PA-C      . ondansetron (ZOFRAN) tablet 4 mg  4 mg Oral Q6H PRN Magnant, Charles L, PA-C       Or  . ondansetron (ZOFRAN) injection 4 mg  4 mg Intravenous Q6H PRN Magnant, Charles L, PA-C      . oxyCODONE (Oxy IR/ROXICODONE) immediate release tablet 5-10 mg  5-10 mg Oral Q4H PRN Magnant, Charles L, PA-C      . pantoprazole (PROTONIX) EC tablet 40 mg  40 mg Oral Daily Stinson, Jacob J, DO      . polyethylene glycol (MIRALAX / GLYCOLAX) packet 17 g  17 g Oral Daily Loma Boston J, DO       . pravastatin (PRAVACHOL) tablet 40 mg  40 mg Oral q1800 Truett Mainland, DO   40 mg at 11/16/19 1726  . pregabalin (LYRICA) capsule 100 mg  100 mg Oral TID Truett Mainland, DO   100 mg at 11/16/19 2119  . rivaroxaban (XARELTO) tablet 10 mg  10 mg Oral Q supper Lajean Saver, [facility]         Discharge Medications: Please see discharge summary for a list of discharge medications.  Relevant Imaging Results:  Relevant Lab Results:   Additional Information SS[id number] [place], [place]

## 2019-11-17 NOTE — Progress Notes (Signed)
Orthopedic Tech Progress Note Patient Details:  [name] Oct 01, 1933 [id number]  CPM Right Knee CPM Right Knee: On Right Knee Flexion (Degrees): 0 Right Knee Extension (Degrees): 40  Post Interventions Patient Tolerated: Well  [name] 11/17/2019, 9:47 PM

## 2019-11-18 ENCOUNTER — Telehealth: Payer: Self-pay

## 2019-11-18 ENCOUNTER — Inpatient Hospital Stay (HOSPITAL_COMMUNITY): Payer: Medicare Other

## 2019-11-18 ENCOUNTER — Encounter (HOSPITAL_COMMUNITY): Payer: Self-pay | Admitting: Orthopedic Surgery

## 2019-11-18 ENCOUNTER — Ambulatory Visit (INDEPENDENT_AMBULATORY_CARE_PROVIDER_SITE_OTHER): Payer: Medicare Other | Admitting: *Deleted

## 2019-11-18 DIAGNOSIS — S72001S Fracture of unspecified part of neck of right femur, sequela: Secondary | ICD-10-CM | POA: Diagnosis not present

## 2019-11-18 DIAGNOSIS — I639 Cerebral infarction, unspecified: Secondary | ICD-10-CM

## 2019-11-18 LAB — TYPE AND SCREEN
ABO/RH(D): B NEG
Antibody Screen: NEGATIVE
Unit division: 0

## 2019-11-18 LAB — CBC
HCT: 23.9 % — ABNORMAL LOW (ref 39.0–52.0)
Hemoglobin: 7.5 g/dL — ABNORMAL LOW (ref 13.0–17.0)
MCH: 31.3 pg (ref 26.0–34.0)
MCHC: 31.4 g/dL (ref 30.0–36.0)
MCV: 99.6 fL (ref 80.0–100.0)
Platelets: 123 10*3/uL — ABNORMAL LOW (ref 150–400)
RBC: 2.4 MIL/uL — ABNORMAL LOW (ref 4.22–5.81)
RDW: 16.6 % — ABNORMAL HIGH (ref 11.5–15.5)
WBC: 9.6 10*3/uL (ref 4.0–10.5)
nRBC: 0 % (ref 0.0–0.2)

## 2019-11-18 LAB — BASIC METABOLIC PANEL
Anion gap: 7 (ref 5–15)
BUN: 26 mg/dL — ABNORMAL HIGH (ref 8–23)
CO2: 26 mmol/L (ref 22–32)
Calcium: 7.9 mg/dL — ABNORMAL LOW (ref 8.9–10.3)
Chloride: 107 mmol/L (ref 98–111)
Creatinine, Ser: 1.43 mg/dL — ABNORMAL HIGH (ref 0.61–1.24)
GFR calc Af Amer: 52 mL/min — ABNORMAL LOW (ref >60)
GFR calc non Af Amer: 45 mL/min — ABNORMAL LOW (ref >60)
Glucose, Bld: 119 mg/dL — ABNORMAL HIGH (ref 70–99)
Potassium: 3.7 mmol/L (ref 3.5–5.1)
Sodium: 140 mmol/L (ref 135–145)

## 2019-11-18 LAB — CUP PACEART REMOTE DEVICE CHECK
Date Time Interrogation Session: 20210718232325
Implantable Pulse Generator Implant Date: 20191021

## 2019-11-18 LAB — GLUCOSE, CAPILLARY
Glucose-Capillary: 118 mg/dL — ABNORMAL HIGH (ref 70–99)
Glucose-Capillary: 129 mg/dL — ABNORMAL HIGH (ref 70–99)
Glucose-Capillary: 107 mg/dL — ABNORMAL HIGH (ref 70–99)
Glucose-Capillary: 135 mg/dL — ABNORMAL HIGH (ref 70–99)

## 2019-11-18 LAB — HEMOGLOBIN AND HEMATOCRIT, BLOOD
HCT: 27.5 % — ABNORMAL LOW (ref 39.0–52.0)
Hemoglobin: 8.5 g/dL — ABNORMAL LOW (ref 13.0–17.0)

## 2019-11-18 LAB — BPAM RBC
Blood Product Expiration Date: 202108022359
ISSUE DATE / TIME: 202107181329
Unit Type and Rh: 1700

## 2019-11-18 LAB — AMMONIA: Ammonia: 14 umol/L (ref 9–35)

## 2019-11-18 LAB — HEPATIC FUNCTION PANEL
ALT: 12 U/L (ref 0–44)
AST: 25 U/L (ref 15–41)
Albumin: 2.7 g/dL — ABNORMAL LOW (ref 3.5–5.0)
Alkaline Phosphatase: 64 U/L (ref 38–126)
Bilirubin, Direct: 0.2 mg/dL (ref 0.0–0.2)
Indirect Bilirubin: 0.8 mg/dL (ref 0.3–0.9)
Total Bilirubin: 1 mg/dL (ref 0.3–1.2)
Total Protein: 5.8 g/dL — ABNORMAL LOW (ref 6.5–8.1)

## 2019-11-18 MED ORDER — RIVAROXABAN 10 MG PO TABS
10.0000 mg | ORAL_TABLET | Freq: Every day | ORAL | Status: DC
  Administered 2019-11-19 – 2019-11-23 (×5): 10 mg via ORAL
  Filled 2019-11-18 (×5): qty 1

## 2019-11-18 MED ORDER — RESOURCE THICKENUP CLEAR PO POWD
Freq: Once | ORAL | Status: AC
  Filled 2019-11-18: qty 125

## 2019-11-18 MED ORDER — INSULIN ASPART 100 UNIT/ML ~~LOC~~ SOLN
0.0000 [IU] | SUBCUTANEOUS | Status: DC
  Administered 2019-11-18 – 2019-11-21 (×13): 1 [IU] via SUBCUTANEOUS
  Administered 2019-11-21 – 2019-11-22 (×2): 2 [IU] via SUBCUTANEOUS
  Administered 2019-11-22 (×4): 1 [IU] via SUBCUTANEOUS
  Administered 2019-11-22 – 2019-11-23 (×2): 2 [IU] via SUBCUTANEOUS
  Administered 2019-11-23 – 2019-11-24 (×5): 1 [IU] via SUBCUTANEOUS
  Administered 2019-11-24 (×2): 2 [IU] via SUBCUTANEOUS
  Administered 2019-11-24: 1 [IU] via SUBCUTANEOUS
  Administered 2019-11-24 – 2019-11-25 (×3): 2 [IU] via SUBCUTANEOUS
  Administered 2019-11-25: 1 [IU] via SUBCUTANEOUS
  Administered 2019-11-25: 2 [IU] via SUBCUTANEOUS
  Administered 2019-11-25: 1 [IU] via SUBCUTANEOUS
  Administered 2019-11-26 (×3): 2 [IU] via SUBCUTANEOUS
  Administered 2019-11-26: 1 [IU] via SUBCUTANEOUS
  Administered 2019-11-26: 2 [IU] via SUBCUTANEOUS
  Administered 2019-11-27: 3 [IU] via SUBCUTANEOUS
  Administered 2019-11-27 (×2): 1 [IU] via SUBCUTANEOUS
  Administered 2019-11-27: 2 [IU] via SUBCUTANEOUS
  Administered 2019-11-27: 1 [IU] via SUBCUTANEOUS
  Administered 2019-11-28: 2 [IU] via SUBCUTANEOUS
  Administered 2019-11-28: 1 [IU] via SUBCUTANEOUS
  Administered 2019-11-28 (×2): 2 [IU] via SUBCUTANEOUS
  Administered 2019-11-28 – 2019-11-29 (×3): 1 [IU] via SUBCUTANEOUS
  Administered 2019-11-29: 2 [IU] via SUBCUTANEOUS
  Administered 2019-11-29 – 2019-11-30 (×6): 1 [IU] via SUBCUTANEOUS

## 2019-11-18 MED ORDER — METOPROLOL TARTRATE 5 MG/5ML IV SOLN
5.0000 mg | INTRAVENOUS | Status: AC | PRN
  Administered 2019-11-29: 5 mg via INTRAVENOUS
  Filled 2019-11-18: qty 5

## 2019-11-18 NOTE — Progress Notes (Signed)
Patient on CPM 0-40 for 2 1/2 hours and tolerated well.

## 2019-11-18 NOTE — Progress Notes (Signed)
Kindly inform the patient that carotid ultrasound shows mild 40 to 59% narrowing of both carotid arteries in the neck.  This is expectedly worse than previous carotid ultrasound 4 years ago but not surprising.  No need for any surgery or stenting at this time but medical management needs to continue and yearly follow-up carotid ultrasound studies are recommended

## 2019-11-18 NOTE — Progress Notes (Signed)
Kindly inform the patient that transcranial Doppler study is not optimal due to poor bony windows but do not suggest any major blockages or worrisome findings

## 2019-11-18 NOTE — Plan of Care (Signed)
  Problem: Education: Goal: Verbalization of understanding the information provided (i.e., activity precautions, restrictions, etc) will improve Outcome: Progressing Goal: Individualized Educational Video(s) Outcome: Progressing   

## 2019-11-18 NOTE — Progress Notes (Signed)
Patient transported to CT at 1205 via bed by orderly on 2L O2.

## 2019-11-18 NOTE — Progress Notes (Signed)
  Subjective: [name] is a 86 y.o. male s/p right hip IM nail.  They are POD 2.  Pt's pain is controlled.  Patient continues to be sedated and drowsy.  He is able to be awakened upon examination today.  He quickly dozes off again after getting his attention.  Hospitalist team has ordered a head CT.Marland Kitchen    Objective: Vital signs in last 24 hours: Temp:  [97.5 F (36.4 C)-99.7 F (37.6 C)] 97.5 F (36.4 C) (07/19 0825) Pulse Rate:  [58-110] 97 (07/19 0825) Resp:  [14-17] 17 (07/19 0825) BP: (121-152)/(69-92) 126/69 (07/19 0825) SpO2:  [94 %-99 %] 99 % (07/19 0825)  Intake/Output from previous day: 07/18 0701 - 07/19 0700 In: 3125.2 [P.O.:600; I.V.:2525.2] Out: 1500 [Urine:1500] Intake/Output this shift: No intake/output data recorded.  Exam:  No gross blood or drainage overlying the dressing Right foot warm and well-perfused Great strength with right foot plantar flexion.  Weakness with dorsiflexion compared with contralateral side.   Labs: Recent Labs    11/16/19 0002 11/17/19 0220 11/17/19 1040 11/18/19 0116 11/18/19 1445  HGB 11.8* 7.9* 7.0* 7.5* 8.5*   Recent Labs    11/17/19 0220 11/17/19 1040 11/18/19 0116 11/18/19 1445  WBC 13.4*  --  9.6  --   RBC 2.43*  --  2.40*  --   HCT 25.1*   < > 23.9* 27.5*  PLT 142*  --  123*  --    < > = values in this interval not displayed.   Recent Labs    11/17/19 0220 11/18/19 0116  NA 138 140  K 4.2 3.7  CL 106 107  CO2 26 26  BUN 26* 26*  CREATININE 1.53* 1.43*  GLUCOSE 157* 119*  CALCIUM 8.1* 7.9*   No results for input(s): LABPT, INR in the last 72 hours.  Assessment/Plan: Pt is POD 2 s/p right hip IM nail.    -Disposition pending medical team clearance and decision  -Touchdown weight bearing to RLE  -Follow-up with Dr. Marlou Sa in clinic 7 to 10 days postoperatively  -DVT Prophylaxis: Aspirin 81 mg.  Resume Xarelto tomorrow.     [name] [name] 11/18/2019, 6:55 PM

## 2019-11-18 NOTE — Progress Notes (Signed)
PROGRESS NOTE  [name] NID:[id number] DOB: 21-Aug-1933 DOA: 11/15/2019 PCP: [name], MD  HPI/Recap of past 24 hours: Dennis Heck [name] a 86 y.o.malewith a history of diet-controlled type 2 diabetes with stage III chronic kidney disease and diabetic neuropathy, coronary artery disease status post bypass,CVA.Patient presents with right hip pain after a mechanical fall with the patient tripping as he was trying to get out of the car. Pain started immediately after the fall and is worse with movement and improved with rest. EMS was called and the patient was brought to the emergency department for evaluation.  X-rays show a right intertrochanteric hip fracture.  POD #1 post right hip repair on 11/16/2019.  On Xarelto for DVT prophylaxis stopped on 7/18 due to drop in Hg.   Hospital course complicated by lethargy post surgery.  Per his wife he has had multiple strokes and a progressive decline in his cognitive abilities.  Received 1 unit PRBC on 7/18 due to drop of hemoglobin down to 7.0 from 11.8.  Xarelto held and ASA dose reduced to 81 mg daily per Dr. Randel Pigg recommendation.  11/18/19: Seen and examined with his wife at his bedside.  Still very drowsy but arousable to voices.  Able to answer that he is in the hospital and identifies the name of the president.  POD #2 post right hip repair.  Will obtain a CT head to further assess.   Assessment/Plan: Principal Problem:   Closed right hip fracture (HCC) Active Problems:   GERD   Supine hypertension   PVD (peripheral vascular disease) (HCC)   Coronary artery disease involving coronary bypass graft of native heart without angina pectoris   Diabetes mellitus type 2, diet-controlled ([place])   CKD stage 3 due to type 2 diabetes mellitus (HCC)   Dysautonomia orthostatic hypotension syndrome (HCC)   Diabetic neuropathy ([place])   History of lacunar cerebrovascular accident (CVA)   Closed right hip fracture post surgical  repair on 11/16/2019 by Dr. Marlou Sa S/p Rt hip IM nail placement Pain management in place Management per orthopedic surgery PT assessment recommended SNF TOC assisting with SNF placement  Acute blood loss anemia post surgery Baseline hemoglobin appears to be 11.8 Drop in hemoglobin on 7/8 down to 7.9 from 11.8K, repeated H&H 7.0 Transfused 1 unit PRBC>> Hg 7.5 post transfusion  Xarelto held 7/18, ASA dose reduced to 81 mg daily Repeat H&H at 1443  Acute metabolic encephalopathy likely multifactorial in the setting of prior CVA, cognitive decline, anesthetic residuals, persistent Obtain CT head without contrast Continue fall precautions Avoid narcotics as possible Continue pulse oximetry Continue to closely monitor on telemetry  Prediabetes with hyperglycemia likely exacerbated by chronic steroid use  Diet controlled On Florinef daily Hemoglobin A1c 6.1 Continue insulin sliding scale as needed Avoid hypoglycemia  Dysautonomia orthostatic hypotension syndrome On Florinef daily Blood pressure is stable Maintain MAP greater than 65 Continue to monitor vital signs  Possible dysphagia NPO until fully alert Speech to assess once more alert Aspirations precautions  CKD stage IIIA Baseline creatinine appears to be 1.4 with GFR 44 Creatinine back to baseline on IV fluid Continue to avoid nephrotoxins and dehydration Good urine output 1.5 L recorded in the last 24 hours Continue to monitor urine output Repeat BMP in the morning  History of lacunar CVA Continue aspirin and Pravachol Prior to admission on 162 mg of aspirin due to failure of aspirin 81 mg with TIA, per neurology Aspirin dose reduced to 81 mg daily due to acute  blood loss with drop in hemoglobin CT head without contrast due to acute metabolic encephalopathy of unclear etiology  Coronary artery disease status post CABG Denies any anginal symptoms at the time of this visit Continue statin, aspirin  Ambulatory  dysfunction post right hip repair PT recommended SNF TOC assisting with SNF placement Continue PT OT with assistance and fall precautions    DVT prophylaxis: ASA 81 mg daily, SCDs Code Status: DNR Family Communication:  Updated his wife at bedside. Disposition Plan: SNF Status is: Inpatient  Remains inpatient appropriate because:Ongoing work-up for acute metabolic encephalopathy post surgery.  Dispo: The patient is from: Home  Anticipated d/c is to: SNF  Anticipated d/c date is: 11/20/2019  Patient currently is not medically stable to d/c due to acute blood loss anemia requiring PRBC transfusion and persistent acute metabolic encephalopathy.  Pending SNF placement.         Objective: Vitals:   11/17/19 1620 11/17/19 2131 11/18/19 0503 11/18/19 0825  BP: 113/64 121/75 (!) 152/92 126/69  Pulse: 98 (!) 58 (!) 110 97  Resp: 16 16 14 17   Temp: 98 F (36.7 C) 99.7 F (37.6 C) 99.2 F (37.3 C) (!) 97.5 F (36.4 C)  TempSrc: Oral Oral Oral Oral  SpO2: 94% 94% 98% 99%  Weight:      Height:        Intake/Output Summary (Last 24 hours) at 11/18/2019 1137 Last data filed at 11/18/2019 0507 Gross per 24 hour  Intake 2765.23 ml  Output 1500 ml  Net 1265.23 ml   Filed Weights   11/15/19 1405  Weight: 81.6 kg    Exam:  . General: 86 y.o. year-old male well-developed well-nourished in no acute distress.  Lethargic.   . Cardiovascular: Regular rate and rhythm no rubs or gallops.   Marland Kitchen Respiratory: Clear to auscultation anteriorly poor inspiratory effort. .   Abdomen: Soft nontender normal bowel sounds present. . Musculoskeletal: Right lateral thigh edematous.  No bruising or erythema noted.   Marland Kitchen Psychiatry: Unable to assess mood due to somnolence.   Data Reviewed: CBC: Recent Labs  Lab 11/15/19 1542 11/16/19 0002 11/17/19 0220 11/17/19 1040 11/18/19 0116  WBC 6.9 13.7* 13.4*  --  9.6  NEUTROABS 4.5  --   --   --   --     HGB 11.8* 11.8* 7.9* 7.0* 7.5*  HCT 37.3* 38.2* 25.1* 22.7* 23.9*  MCV 102.8* 102.4* 103.3*  --  99.6  PLT 172 181 142*  --  466*   Basic Metabolic Panel: Recent Labs  Lab 11/15/19 1542 11/16/19 0002 11/17/19 0220 11/18/19 0116  NA 142 142 138 140  K 3.9 4.8 4.2 3.7  CL 106 106 106 107  CO2 28 28 26 26   GLUCOSE 153* 154* 157* 119*  BUN 23 21 26* 26*  CREATININE 1.44* 1.53* 1.53* 1.43*  CALCIUM 8.6* 8.6* 8.1* 7.9*   GFR: Estimated Creatinine Clearance: 42.2 mL/min (A) (by C-G formula based on SCr of 1.43 mg/dL (H)). Liver Function Tests: No results for input(s): AST, ALT, ALKPHOS, BILITOT, PROT, ALBUMIN in the last 168 hours. No results for input(s): LIPASE, AMYLASE in the last 168 hours. No results for input(s): AMMONIA in the last 168 hours. Coagulation Profile: Recent Labs  Lab 11/15/19 1542  INR 1.1   Cardiac Enzymes: No results for input(s): CKTOTAL, CKMB, CKMBINDEX, TROPONINI in the last 168 hours. BNP (last 3 results) No results for input(s): PROBNP in the last 8760 hours. HbA1C: No results for input(s): HGBA1C in  the last 72 hours. CBG: Recent Labs  Lab 11/17/19 1204 11/17/19 1640 11/17/19 2129 11/18/19 0619 11/18/19 1111  GLUCAP 129* 136* 116* 118* 129*   Lipid Profile: No results for input(s): CHOL, HDL, LDLCALC, TRIG, CHOLHDL, LDLDIRECT in the last 72 hours. Thyroid Function Tests: No results for input(s): TSH, T4TOTAL, FREET4, T3FREE, THYROIDAB in the last 72 hours. Anemia Panel: No results for input(s): VITAMINB12, FOLATE, FERRITIN, TIBC, IRON, RETICCTPCT in the last 72 hours. Urine analysis:    Component Value Date/Time   COLORURINE YELLOW 09/11/2019 [place] 09/11/2019 1652   LABSPEC 1.020 09/11/2019 1652   PHURINE 5.0 09/11/2019 1652   GLUCOSEU NEGATIVE 09/11/2019 1652   HGBUR NEGATIVE 09/11/2019 1652   BILIRUBINUR NEGATIVE 09/11/2019 1652   KETONESUR NEGATIVE 09/11/2019 1652   PROTEINUR NEGATIVE 09/11/2019 1652    UROBILINOGEN 1.0 01/05/2015 1550   NITRITE NEGATIVE 09/11/2019 1652   LEUKOCYTESUR NEGATIVE 09/11/2019 1652   Sepsis Labs: @LABRCNTIP (procalcitonin:4,lacticidven:4)  ) Recent Results (from the past 240 hour(s))  SARS Coronavirus 2 by RT PCR (hospital order, performed in [place] hospital lab) Nasopharyngeal Nasopharyngeal Swab     Status: None   Collection Time: 11/15/19  3:30 PM   Specimen: Nasopharyngeal Swab  Result Value Ref Range Status   SARS Coronavirus 2 NEGATIVE NEGATIVE Final    Comment: (NOTE) SARS-CoV-2 target nucleic acids are NOT DETECTED.  The SARS-CoV-2 RNA is generally detectable in upper and lower respiratory specimens during the acute phase of infection. The lowest concentration of SARS-CoV-2 viral copies this assay can detect is 250 copies / mL. A negative result does not preclude SARS-CoV-2 infection and should not be used as the sole basis for treatment or other patient management decisions.  A negative result may occur with improper specimen collection / handling, submission of specimen other than nasopharyngeal swab, presence of viral mutation(s) within the areas targeted by this assay, and inadequate number of viral copies (<250 copies / mL). A negative result must be combined with clinical observations, patient history, and epidemiological information.  Fact Sheet for Patients:   StrictlyIdeas.no  Fact Sheet for Healthcare Providers: BankingDealers.co.za  This test is not yet approved or  cleared by the [place] FDA and has been authorized for detection and/or diagnosis of SARS-CoV-2 by FDA under an Emergency Use Authorization (EUA).  This EUA will remain in effect (meaning this test can be used) for the duration of the COVID-19 declaration under Section 564(b)(1) of the Act, 21 U.S.C. section 360bbb-3(b)(1), unless the authorization is terminated or revoked sooner.  Performed at [facility], [address], [place], [place] [address]   MRSA PCR Screening     Status: None   Collection Time: 11/16/19  1:03 AM   Specimen: Nasal Mucosa; Nasopharyngeal  Result Value Ref Range Status   MRSA by PCR NEGATIVE NEGATIVE Final    Comment:        The GeneXpert MRSA Assay (FDA approved for NASAL specimens only), is one component of a comprehensive MRSA colonization surveillance program. It is not intended to diagnose MRSA infection nor to guide or monitor treatment for MRSA infections. Performed at [facility], [place] [address], [place], [place] [address]       Studies: No results found.  Scheduled Meds: . aspirin EC  81 mg Oral Daily  . docusate sodium  100 mg Oral BID  . fludrocortisone  0.1 mg Oral Daily  . insulin aspart  0-5 Units Subcutaneous QHS  . insulin aspart  0-9 Units Subcutaneous TID WC  . pantoprazole  40 mg Oral Daily  . polyethylene glycol  17 g Oral Daily  . pravastatin  40 mg Oral q1800  . pregabalin  100 mg Oral TID  . [START ON 11/19/2019] rivaroxaban  10 mg Oral Q supper    Continuous Infusions: . sodium chloride 50 mL/hr at 11/18/19 0639  . methocarbamol (ROBAXIN) IV       LOS: 3 days     Kayleen Memos, MD [facility] Pager [phone]  If 7PM-7AM, please contact night-coverage [email or web address] Password [facility] 11/18/2019, 11:37 AM

## 2019-11-18 NOTE — Progress Notes (Signed)
Will resume Xarelto for VTE prophylaxis tomorrow 11/19/19 x 3 weeks as recommended by Dr. Marlou Sa.  Hold off ASA while on Xarelto.  Resume home dose ASA when off Xarelto.

## 2019-11-18 NOTE — Evaluation (Signed)
Clinical/Bedside Swallow Evaluation Patient Details  Name: [name] MRN: [id number] Date of Birth: 1933/07/29  Today's Date: 11/18/2019 Time: SLP Start Time (ACUTE ONLY): 30 SLP Stop Time (ACUTE ONLY): 1332 SLP Time Calculation (min) (ACUTE ONLY): 13 min  Past Medical History:  Past Medical History:  Diagnosis Date  . 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 Colon polyps   . Coronary atherosclerosis of unspecified type of vessel, native or graft   . Diabetes ([place])   . Difficult intubation   . 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 Esophageal reflux   . High cholesterol   . History of IBS 02/27/2009  . Hypertension    pt denies, he says he has a h/o hypotension. If BP up he adjusts the Florinef  . Jejunitis with partial SBO 05/20/2017  . Orthostatic hypotension    "BP has been dropping alot when I stand up for the last month or so" (02/17/2016)  . Prostatitis   . Upper GI bleed 06/24/2018  . Vitamin B 12 deficiency   . Vitamin D deficiency    Past Surgical History:  Past Surgical History:  Procedure Laterality Date  . BIOPSY  06/25/2018   Procedure: BIOPSY;  Surgeon: Rush Landmark Telford Nab., MD;  Location: [place];  Service: Gastroenterology;;  . BIOPSY  04/24/2019   Procedure: BIOPSY;  Surgeon: Irving Copas., MD;  Location: [facility];  Service: Gastroenterology;;  . BLADDER SURGERY  1969   traumatic pelvic fractures, urethral and bladder repair  . CARDIAC CATHETERIZATION N/A 07/01/2015   Procedure: Left Heart Cath and Coronary Angiography;  Surgeon: Wellington Hampshire, MD;  Location: [facility];  Service: Cardiovascular;  Laterality: N/A;  . COLON RESECTION N/A 05/17/2017   Procedure: DIAGNOSTIC LAPAROSCOPY,;  Surgeon: Leighton Ruff, MD;  Location: [facility];  Service: General;  Laterality: N/A;  . COLONOSCOPY WITH PROPOFOL N/A 04/24/2019   Procedure: COLONOSCOPY WITH PROPOFOL;  Surgeon: Irving Copas., MD;  Location: [facility];  Service:  Gastroenterology;  Laterality: N/A;  . CORONARY ANGIOPLASTY WITH STENT PLACEMENT    . CORONARY ARTERY BYPASS GRAFT N/A 07/06/2015   Procedure: CORONARY ARTERY BYPASS GRAFTING (CABG)x 4   utilizing the left internal mammary artery and endoscopically harvested bilateral  sapheneous vein.;  Surgeon: Ivin Poot, MD;  Location: [facility];  Service: Open Heart Surgery;  Laterality: N/A;  . ESOPHAGEAL DILATION  04/24/2019   Procedure: ESOPHAGEAL DILATION;  Surgeon: Rush Landmark Telford Nab., MD;  Location: [facility];  Service: Gastroenterology;;  . ESOPHAGOGASTRODUODENOSCOPY (EGD) WITH PROPOFOL N/A 06/25/2018   Procedure: ESOPHAGOGASTRODUODENOSCOPY (EGD) WITH PROPOFOL;  Surgeon: Irving Copas., MD;  Location: [facility];  Service: Gastroenterology;  Laterality: N/A;  . ESOPHAGOGASTRODUODENOSCOPY (EGD) WITH PROPOFOL N/A 04/24/2019   Procedure: ESOPHAGOGASTRODUODENOSCOPY (EGD) WITH PROPOFOL;  Surgeon: Rush Landmark Telford Nab., MD;  Location: [facility];  Service: Gastroenterology;  Laterality: N/A;  . HEMOSTASIS CLIP PLACEMENT  04/24/2019   Procedure: HEMOSTASIS CLIP PLACEMENT;  Surgeon: Irving Copas., MD;  Location: [facility];  Service: Gastroenterology;;  . KNEE SURGERY    . LOOP RECORDER INSERTION N/A 02/19/2018   Procedure: LOOP RECORDER INSERTION;  Surgeon: Deboraha Sprang, MD;  Location: [facility];  Service: Cardiovascular;  Laterality: N/A;  . POLYPECTOMY  04/24/2019   Procedure: POLYPECTOMY;  Surgeon: Rush Landmark Telford Nab., MD;  Location: [facility];  Service: Gastroenterology;;  . Lia Foyer LIFTING INJECTION  04/24/2019   Procedure: SUBMUCOSAL LIFTING INJECTION;  Surgeon: Irving Copas., MD;  Location: [facility];  Service: Gastroenterology;;  . TEE WITHOUT CARDIOVERSION N/A 07/06/2015   Procedure: TRANSESOPHAGEAL ECHOCARDIOGRAM (TEE);  Surgeon: Ivin Poot, MD;  Location: [facility];  Service: Open Heart Surgery;  Laterality: N/A;   HPI:   86 y.o.  male with a history of diet-controlled type 2 diabetes with stage III chronic kidney disease and diabetic neuropathy, coronary artery disease status post bypass, CVA.  Patient presents with right hip fx after a mechanical fall.  Underwent right hip IM nail placement 7/17. Now with acute metabolic encephalopathy.  [name] present, she reports hx of increasing swallowing difficulty in the last several months, with coughing/choking during meals.    Assessment / Plan / Recommendation Clinical Impression  Pt presents with concerns for potential aspiration when drinking thin liquids, observed by this SLP today during assessment and confirmed by his wife, who reports consistent coughing with thin liquids the last several months.  Purees, nectars, and a graham cracker were tolerated with adequate anticipation, brisk swallow response, and no overt indication of airway intrusion.  Recommend resuming pt's prior heart healthy diet; thicken liquids to nectar; plan for MBS next date.  D/W pt's wife, who agrees with plan.   SLP Visit Diagnosis: Dysphagia, unspecified (R13.10)    Aspiration Risk       Diet Recommendation   regular solids, nectar thick liquids; MBS next date  Medication Administration: Whole meds with puree    Other  Recommendations Oral Care Recommendations: Oral care BID   Follow up Recommendations Other (comment) (tba)      Frequency and Duration            Prognosis        Swallow Study   General HPI:  86 y.o. male with a history of diet-controlled type 2 diabetes with stage III chronic kidney disease and diabetic neuropathy, coronary artery disease status post bypass, CVA.  Patient presents with right hip fx after a mechanical fall.  Underwent right hip IM nail placement 7/17. Now with acute metabolic encephalopathy.  [name] present, she reports hx of increasing swallowing difficulty in the last several months, with coughing/choking during meals.  Type of Study: Bedside  Swallow Evaluation Previous Swallow Assessment: none per records Diet Prior to this Study: NPO Temperature Spikes Noted: No Respiratory Status: Nasal cannula History of Recent Intubation:  (for procedure) Behavior/Cognition: Alert;Cooperative;Confused Oral Cavity Assessment: Within Functional Limits Oral Care Completed by SLP: No Oral Cavity - Dentition: Dentures, top;Dentures, bottom Self-Feeding Abilities: Needs assist Patient Positioning: Upright in bed Baseline Vocal Quality: Normal Volitional Cough: Strong Volitional Swallow: Able to elicit    Oral/Motor/Sensory Function Overall Oral Motor/Sensory Function: Within functional limits   Ice Chips Ice chips: Within functional limits   Thin Liquid Thin Liquid: Impaired Presentation: Straw;Cup Pharyngeal  Phase Impairments: Multiple swallows;Cough - Immediate    Nectar Thick Nectar Thick Liquid: Within functional limits   Honey Thick Honey Thick Liquid: Not tested   Puree Puree: Within functional limits   Solid     Solid: Within functional limits      Juan Quam Laurice 11/18/2019,2:57 PM  Estill Bamberg L. Tivis Ringer, [place] Office number [phone] Pager [phone]

## 2019-11-18 NOTE — Telephone Encounter (Signed)
[name] with [facility] would like a call back  concerning patient's medication, Aspirin and one other medication.  CB# [phone].  Please advise.  Thank you.

## 2019-11-18 NOTE — Plan of Care (Signed)
  Problem: Education: Goal: Verbalization of understanding the information provided (i.e., activity precautions, restrictions, etc) will improve Outcome: Progressing   Problem: Clinical Measurements: Goal: Postoperative complications will be avoided or minimized Outcome: Progressing   Problem: Self-Concept: Goal: Ability to maintain and perform role responsibilities to the fullest extent possible will improve Outcome: Progressing   Problem: Pain Management: Goal: Pain level will decrease Outcome: Progressing

## 2019-11-18 NOTE — Telephone Encounter (Signed)
See below. Patient is still inpatient. Please call pharmacy to discuss meds.

## 2019-11-18 NOTE — Progress Notes (Signed)
ANTICOAGULATION CONSULT NOTE - Initial Consult  Pharmacy Consult for Xarelto Indication: VTE prophylaxis  Allergies  Allergen Reactions  . Other Other (See Comments)    Pt reports allergic to a medication but unsure of name or type of med  . Cymbalta [Duloxetine Hcl] Other (See Comments)    Dizziness, hallucinations.   . Keflex [Cephalexin] Other (See Comments)    dizziness  . Simvastatin Other (See Comments)    Joint pain  . Sudafed [Pseudoephedrine] Other (See Comments)    Dizziness    Patient Measurements: Height: 6' (182.9 cm) Weight: 81.6 kg (180 lb) IBW/kg (Calculated) : 77.6  Vital Signs: Temp: 97.5 F (36.4 C) (07/19 0825) Temp Source: Oral (07/19 0825) BP: 126/69 (07/19 0825) Pulse Rate: 97 (07/19 0825)  Labs: Recent Labs    11/16/19 0002 11/16/19 0002 11/17/19 0220 11/17/19 0220 11/17/19 1040 11/17/19 1040 11/18/19 0116 11/18/19 1445  HGB 11.8*   < > 7.9*   < > 7.0*   < > 7.5* 8.5*  HCT 38.2*   < > 25.1*   < > 22.7*  --  23.9* 27.5*  PLT 181  --  142*  --   --   --  123*  --   CREATININE 1.53*  --  1.53*  --   --   --  1.43*  --    < > = values in this interval not displayed.    Estimated Creatinine Clearance: 42.2 mL/min (A) (by C-G formula based on SCr of 1.43 mg/dL (H)).   Medical History: Past Medical History:  Diagnosis Date  . 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 Colon polyps   . Coronary atherosclerosis of unspecified type of vessel, native or graft   . Diabetes ([place])   . Difficult intubation   . 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 Esophageal reflux   . High cholesterol   . History of IBS 02/27/2009  . Hypertension    pt denies, he says he has a h/o hypotension. If BP up he adjusts the Florinef  . Jejunitis with partial SBO 05/20/2017  . Orthostatic hypotension    "BP has been dropping alot when I stand up for the last month or so" (02/17/2016)  . Prostatitis   . Upper GI bleed 06/24/2018  . Vitamin B 12 deficiency   . Vitamin D  deficiency     Assessment: 86 year old male status post right hip repair following a mechanical fall. Pharmacy consulted to dose Xarelto for VTE prophylaxis.  Goal of Therapy:  Monitor platelets by anticoagulation protocol: Yes   Plan:  - Start Xarelto 10 mg PO daily tomorrow for 3 weeks - Hold aspirin during this 3 week period of time - Monitor renal function and signs and symptoms of bleed   Alanda Slim, PharmD, [place] Clinical Pharmacist Please see AMION for all Pharmacists' Contact Phone Numbers 11/18/2019, 6:17 PM   Please check AMION for all [place] phone numbers After 10:00 PM, call the [place] [phone]

## 2019-11-19 ENCOUNTER — Inpatient Hospital Stay (HOSPITAL_COMMUNITY): Payer: Medicare Other

## 2019-11-19 ENCOUNTER — Encounter (HOSPITAL_COMMUNITY): Payer: Self-pay | Admitting: Internal Medicine

## 2019-11-19 ENCOUNTER — Telehealth: Payer: Self-pay

## 2019-11-19 ENCOUNTER — Telehealth: Payer: Self-pay | Admitting: *Deleted

## 2019-11-19 DIAGNOSIS — R778 Other specified abnormalities of plasma proteins: Secondary | ICD-10-CM

## 2019-11-19 DIAGNOSIS — R4182 Altered mental status, unspecified: Secondary | ICD-10-CM

## 2019-11-19 DIAGNOSIS — R569 Unspecified convulsions: Secondary | ICD-10-CM

## 2019-11-19 DIAGNOSIS — I639 Cerebral infarction, unspecified: Secondary | ICD-10-CM

## 2019-11-19 DIAGNOSIS — S72001S Fracture of unspecified part of neck of right femur, sequela: Secondary | ICD-10-CM | POA: Diagnosis not present

## 2019-11-19 DIAGNOSIS — I371 Nonrheumatic pulmonary valve insufficiency: Secondary | ICD-10-CM

## 2019-11-19 DIAGNOSIS — I361 Nonrheumatic tricuspid (valve) insufficiency: Secondary | ICD-10-CM

## 2019-11-19 LAB — BLOOD GAS, ARTERIAL
Acid-Base Excess: 3.3 mmol/L — ABNORMAL HIGH (ref 0.0–2.0)
Bicarbonate: 27.2 mmol/L (ref 20.0–28.0)
Drawn by: 548791
FIO2: 36
O2 Saturation: 99.2 %
Patient temperature: 36.6
pCO2 arterial: 39.8 mmHg (ref 32.0–48.0)
pH, Arterial: 7.447 (ref 7.350–7.450)
pO2, Arterial: 148 mmHg — ABNORMAL HIGH (ref 83.0–108.0)

## 2019-11-19 LAB — CBC
HCT: 25.3 % — ABNORMAL LOW (ref 39.0–52.0)
Hemoglobin: 8.2 g/dL — ABNORMAL LOW (ref 13.0–17.0)
MCH: 32.2 pg (ref 26.0–34.0)
MCHC: 32.4 g/dL (ref 30.0–36.0)
MCV: 99.2 fL (ref 80.0–100.0)
Platelets: 174 10*3/uL (ref 150–400)
RBC: 2.55 MIL/uL — ABNORMAL LOW (ref 4.22–5.81)
RDW: 15.9 % — ABNORMAL HIGH (ref 11.5–15.5)
WBC: 10.4 10*3/uL (ref 4.0–10.5)
nRBC: 0 % (ref 0.0–0.2)

## 2019-11-19 LAB — URINALYSIS, ROUTINE W REFLEX MICROSCOPIC
Bacteria, UA: NONE SEEN
Bilirubin Urine: NEGATIVE
Glucose, UA: NEGATIVE mg/dL
Ketones, ur: 5 mg/dL — AB
Leukocytes,Ua: NEGATIVE
Nitrite: NEGATIVE
Protein, ur: 30 mg/dL — AB
Specific Gravity, Urine: 1.019 (ref 1.005–1.030)
pH: 6 (ref 5.0–8.0)

## 2019-11-19 LAB — LIPID PANEL
Cholesterol: 101 mg/dL (ref 0–200)
HDL: 26 mg/dL — ABNORMAL LOW (ref >40)
LDL Cholesterol: 59 mg/dL (ref 0–99)
Total CHOL/HDL Ratio: 3.9 RATIO
Triglycerides: 82 mg/dL (ref <150)
VLDL: 16 mg/dL (ref 0–40)

## 2019-11-19 LAB — GLUCOSE, CAPILLARY
Glucose-Capillary: 118 mg/dL — ABNORMAL HIGH (ref 70–99)
Glucose-Capillary: 118 mg/dL — ABNORMAL HIGH (ref 70–99)
Glucose-Capillary: 124 mg/dL — ABNORMAL HIGH (ref 70–99)
Glucose-Capillary: 124 mg/dL — ABNORMAL HIGH (ref 70–99)
Glucose-Capillary: 146 mg/dL — ABNORMAL HIGH (ref 70–99)
Glucose-Capillary: 147 mg/dL — ABNORMAL HIGH (ref 70–99)

## 2019-11-19 LAB — TROPONIN I (HIGH SENSITIVITY)
Troponin I (High Sensitivity): 1194 ng/L (ref <18)
Troponin I (High Sensitivity): 1252 ng/L (ref <18)
Troponin I (High Sensitivity): 1081 ng/L (ref <18)

## 2019-11-19 LAB — HEMOGLOBIN A1C
Hgb A1c MFr Bld: 5.1 % (ref 4.8–5.6)
Mean Plasma Glucose: 99.67 mg/dL

## 2019-11-19 LAB — TSH: TSH: 1.468 u[IU]/mL (ref 0.350–4.500)

## 2019-11-19 LAB — LACTIC ACID, PLASMA: Lactic Acid, Venous: 0.9 mmol/L (ref 0.5–1.9)

## 2019-11-19 LAB — ECHOCARDIOGRAM COMPLETE
Area-P 1/2: 3.42 cm2
Height: 72 in
S' Lateral: 3.7 cm
Weight: 2880 oz

## 2019-11-19 LAB — PROCALCITONIN: Procalcitonin: <0.1 ng/mL

## 2019-11-19 MED ORDER — ASPIRIN EC 81 MG PO TBEC
81.0000 mg | DELAYED_RELEASE_TABLET | Freq: Every day | ORAL | Status: DC
  Administered 2019-11-19 – 2019-11-29 (×11): 81 mg via ORAL
  Filled 2019-11-19 (×11): qty 1

## 2019-11-19 MED ORDER — ATORVASTATIN CALCIUM 80 MG PO TABS
80.0000 mg | ORAL_TABLET | Freq: Every day | ORAL | Status: DC
  Administered 2019-11-19 – 2019-11-30 (×12): 80 mg via ORAL
  Filled 2019-11-19 (×12): qty 1

## 2019-11-19 MED ORDER — OXYCODONE HCL 5 MG PO TABS: 5.0000 mg | ORAL_TABLET | Freq: Four times a day (QID) | ORAL | Status: DC | PRN

## 2019-11-19 MED ORDER — PREGABALIN 100 MG PO CAPS
100.0000 mg | ORAL_CAPSULE | Freq: Every day | ORAL | Status: DC
  Administered 2019-11-20 – 2019-11-21 (×2): 100 mg via ORAL
  Filled 2019-11-19 (×2): qty 1

## 2019-11-19 NOTE — Plan of Care (Signed)
  Problem: Education: Goal: Verbalization of understanding the information provided (i.e., activity precautions, restrictions, etc) will improve Outcome: Progressing Goal: Individualized Educational Video(s) Outcome: Progressing   

## 2019-11-19 NOTE — Consult Note (Signed)
Physical Medicine and Rehabilitation Consult  Reason for Consult: Functional decline.  Referring Physician: Dr. Nevada Crane   HPI: [name] is a 86 y.o. male with history of CKD, orthostatic hypotension, T2DM with neuropathy, GIB, Vitamin B12 deficiency, recent prostatitis, multiple strokes with cognitive decline who was admitted on 11/15/19 after mechanical fall with subsequent right IT hip fracture. He was getting out of his car when his right knee buckled and he fell. He underwent IM nail of right hip on 07/17 by Dr. Marlou Sa and to be TDWB RLE. Post op with ABLA with Hgb 7.0 and transfused with one unit PRBC. He has had issue with confusion and lethargy therefore EEG done revealing diffuse encephalopathy. Cardiology consulted ro elevated troponin and recommended  2 D echo for work up. MRI brain done on 07/20 due to ongoing somnolence and showed multifocal right cerebellar, punctate acute medial left parietal lobe and incidental finding of advanced white matter disease as well as unchanged bilateral papilledema. Neurology consulted for input and felt that strokes embolic in nature and patient's somnolence likely "due to delirium and acute toxic metabolic encephalopathy possibly from Lyrica".   Wife reports that patient has been on Lyrica 100mg  TID for past few months. Per reports he has been declining since heart surgery and memory decline noted few months. Stroke work up initiated and pending. MBS done to evaluate swallow and showed moderate oropharyngeal dysphagia with lethargy limiting ability to follow strategies--dysphagia 3, honey liquids. Therapy has been ongoing and limited due to decreased arousal with difficulty following commands.    According to the patient's wife, patient has been somnolent since surgery he does wake up in answer a few questions.  He then drifts off to sleep but is able to be awakened again to voice.  Per wife:  Review of Systems  Unable to perform ROS: Mental  acuity  Gastrointestinal: Positive for constipation. Negative for heartburn and nausea.  Genitourinary: Positive for frequency (goes every hour during the day).       Nocturia X 3-4 till recently.   Musculoskeletal: Positive for back pain (chronic) and myalgias.       Right knee give out--was in process of being set for TKR  Neurological: Positive for dizziness (all the time since bypass surgery) and sensory change.     Past Medical History:  Diagnosis Date  . AAA (abdominal aortic aneurysm) ([place])   . 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 CAD (coronary artery disease)    a. s/p CABGx4 in 07/2015.  . Carotid artery disease ([place])   . Colon polyps   . Diabetes ([place])   . Difficult intubation   . 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 Esophageal reflux   . High cholesterol   . History of IBS 02/27/2009  . Hypertension    pt denies, he says he has a h/o hypotension. If BP up he adjusts the Florinef  . Jejunitis with partial SBO 05/20/2017  . Orthostatic hypotension   . Prostatitis   . Stroke ([place])   . TIA (transient ischemic attack)   . Upper GI bleed 06/24/2018  . Vitamin B 12 deficiency   . Vitamin D deficiency     Past Surgical History:  Procedure Laterality Date  . BIOPSY  06/25/2018   Procedure: BIOPSY;  Surgeon: Rush Landmark Telford Nab., MD;  Location: [facility];  Service: Gastroenterology;;  . BIOPSY  04/24/2019   Procedure: BIOPSY;  Surgeon: Irving Copas., MD;  [facility];  Service: Gastroenterology;;  . BLADDER SURGERY  1969   traumatic pelvic fractures, urethral and bladder repair  . CARDIAC CATHETERIZATION N/A 07/01/2015   Procedure: Left Heart Cath and Coronary Angiography;  Surgeon: Wellington Hampshire, MD;  Location: [facility];  Service: Cardiovascular;  Laterality: N/A;  . COLON RESECTION N/A 05/17/2017   Procedure: DIAGNOSTIC LAPAROSCOPY,;  Surgeon: Leighton Ruff, MD;  [facility]: [facility];  Service: General;  Laterality: N/A;  . COLONOSCOPY WITH  PROPOFOL N/A 04/24/2019   Procedure: COLONOSCOPY WITH PROPOFOL;  Surgeon: Irving Copas., MD;  [facility];  Service: Gastroenterology;  Laterality: N/A;  . CORONARY ANGIOPLASTY WITH STENT PLACEMENT    . CORONARY ARTERY BYPASS GRAFT N/A 07/06/2015   Procedure: CORONARY ARTERY BYPASS GRAFTING (CABG)x 4   utilizing the left internal mammary artery and endoscopically harvested bilateral  sapheneous vein.;  Surgeon: Ivin Poot, MD;  Location: [facility];  Service: Open Heart Surgery;  Laterality: N/A;  . ESOPHAGEAL DILATION  04/24/2019   Procedure: ESOPHAGEAL DILATION;  Surgeon: Rush Landmark Telford Nab., MD;  [facility];  Service: Gastroenterology;;  . ESOPHAGOGASTRODUODENOSCOPY (EGD) WITH PROPOFOL N/A 06/25/2018   Procedure: ESOPHAGOGASTRODUODENOSCOPY (EGD) WITH PROPOFOL;  Surgeon: Irving Copas., MD;  Location: [facility];  Service: Gastroenterology;  Laterality: N/A;  . ESOPHAGOGASTRODUODENOSCOPY (EGD) WITH PROPOFOL N/A 04/24/2019   Procedure: ESOPHAGOGASTRODUODENOSCOPY (EGD) WITH PROPOFOL;  Surgeon: Rush Landmark Telford Nab., MD;  [facility];  Service: Gastroenterology;  Laterality: N/A;  . FEMUR IM NAIL Right 11/16/2019   Procedure: RIGHT HIP INTRAMEDULLARY (IM) NAIL FEMORAL;  Surgeon: Meredith Pel, MD;  Location: [facility];  Service: Orthopedics;  Laterality: Right;  . HEMOSTASIS CLIP PLACEMENT  04/24/2019   Procedure: HEMOSTASIS CLIP PLACEMENT;  Surgeon: Irving Copas., MD;  [facility];  Service: Gastroenterology;;  . KNEE SURGERY    . LOOP RECORDER INSERTION N/A 02/19/2018   Procedure: LOOP RECORDER INSERTION;  Surgeon: Deboraha Sprang, MD;  Location: [facility];  Service: Cardiovascular;  Laterality: N/A;  . POLYPECTOMY  04/24/2019   Procedure: POLYPECTOMY;  Surgeon: Rush Landmark Telford Nab., MD;  Location: [facility];  Service: Gastroenterology;;  . Lia Foyer LIFTING INJECTION  04/24/2019   Procedure:  SUBMUCOSAL LIFTING INJECTION;  Surgeon: Irving Copas., MD;  [facility];  Service: Gastroenterology;;  . TEE WITHOUT CARDIOVERSION N/A 07/06/2015   Procedure: TRANSESOPHAGEAL ECHOCARDIOGRAM (TEE);  Surgeon: Ivin Poot, MD;  Location: [facility];  Service: Open Heart Surgery;  Laterality: N/A;    Family History  Problem Relation Age of Onset  . Heart attack Father        died age 52  . Heart attack Brother        died age 18  . Anuerysm Brother        aortic  . Heart attack Sister        died age 36  . Colon cancer Sister   . Liver cancer Sister   . Diabetes Maternal Grandmother   . Arthritis Mother   . Dementia Mother   . Heart Problems Brother   . Heart Problems Brother   . Heart Problems Brother   . Heart Problems Brother   . Healthy Daughter   . Healthy Son   . Esophageal cancer Neg Hx   . Inflammatory bowel disease Neg Hx   . Pancreatic cancer Neg Hx   . Stomach cancer Neg Hx     Social History: Married. Retired from [employer]. Independent with walker PTA--uses a cane  to go down the stairs. He reports that he quit smoking about 56 years ago. He quit smokeless tobacco use about 32 years ago.  His smokeless tobacco use included chew---quit 35 years ago. He reports that he does not drink alcohol and does not use drugs.   Allergies  Allergen Reactions  . Other Other (See Comments)    Pt reports allergic to a medication but unsure of name or type of med  . Cymbalta [Duloxetine Hcl] Other (See Comments)    Dizziness, hallucinations.   . Keflex [Cephalexin] Other (See Comments)    dizziness  . Simvastatin Other (See Comments)    Joint pain  . Sudafed [Pseudoephedrine] Other (See Comments)    Dizziness    Medications Prior to Admission  Medication Sig Dispense Refill  . acetaminophen (TYLENOL) 500 MG tablet Take 1,000 mg by mouth in the morning and at bedtime.     Marland Kitchen aspirin EC 81 MG tablet Take 2 tablets (162 mg total) by mouth daily.    . Calcium  Carb-Cholecalciferol (CALCIUM+D3 PO) Take 1 tablet by mouth daily.    . Cholecalciferol (VITAMIN D-3) 5000 units TABS Take 5,000 Units by mouth daily.    . ferrous sulfate 325 (65 FE) MG tablet Take 325 mg by mouth daily with breakfast.    . fludrocortisone (FLORINEF) 0.1 MG tablet Take 2 tablets Daily for Low BP (Patient taking differently: Take 0.1 mg by mouth daily. Daily for Low BP) 180 tablet 0  . glucose blood test strip Check blood sugar 1 time daily-DX-E11.9 (Patient taking differently: 1 each by Other route as needed. Check blood sugar 1 time daily-DX-E11.9) 100 each 5  . Multiple Vitamin (MULTIVITAMIN WITH MINERALS) TABS tablet Take 1 tablet by mouth daily.    Marland Kitchen olmesartan (BENICAR) 40 MG tablet Take 20 mg by mouth daily as needed (for elevated BP).     . Omega-3 Fatty Acids (FISH OIL) 600 MG CAPS Take 1 capsule by mouth daily.    . pantoprazole (PROTONIX) 40 MG tablet Take 1 tablet Daily for Indigestion & Heartburn (Patient taking differently: Take 40 mg by mouth daily. ) 90 tablet 3  . polyethylene glycol (MIRALAX / GLYCOLAX) packet Take 17 g by mouth daily. (Patient taking differently: Take 17 g by mouth 2 (two) times daily. ) 28 each 1  . pravastatin (PRAVACHOL) 40 MG tablet Take 1 tablet at Bedtime for Cholesterol 90 tablet 1  . pregabalin (LYRICA) 100 MG capsule Take 100 mg by mouth 3 (three) times daily.    . Zinc 50 MG TABS Take 1 tablet by mouth daily.      Home: Home Living Family/patient expects to be discharged to:: Private residence Living Arrangements: Spouse/significant other Available Help at Discharge: Family Type of Home: House Home Access: Stairs to enter, Electrical engineer of Steps: 3 [place]: One level Bathroom Shower/Tub: Tub/shower unit [place]: Environmental consultant - 2 wheels, [employer] - single point, Transport planner, Systems analyst, Engineer, civil (consulting), Careers adviser History: Prior Function Level of Independence: Needs  assistance Gait / Transfers Assistance Needed: limited household ambulator, uses walker vs cane, utilizes Air cabin crew for community distances ADL's / [employer] Assistance Needed: assist with ADL's Functional Status:  Mobility: Bed Mobility Overal bed mobility: Needs Assistance Bed Mobility: Supine to Sit, Sit to Supine Supine to sit: Max assist, HOB elevated Sit to supine: Max assist, +2 for physical assistance, +2 for safety/equipment General bed mobility comments: increased time and max cues  to sequence to EOB with assist R LE and trunk support max A, max A x2 to guide trunk and lift LEs back onto bed  Transfers General transfer comment: deferred due to assist x1, still somewhat groggy at edge of bed and limited sitting tolerance "I'm going to fall back"      ADL: ADL Overall ADL's : Needs assistance/impaired Grooming: Supervision/safety, Wash/dry face, Bed level Grooming Details (indicate cue type and reason): mod cues for thoroughness  Upper Body Bathing: Moderate assistance, Bed level, Sitting Lower Body Bathing: Total assistance, Sitting/lateral leans, Bed level Upper Body Dressing : Moderate assistance, Sitting, Bed level Lower Body Dressing: Total assistance, Sitting/lateral leans, Bed level Toilet Transfer Details (indicate cue type and reason): did not attempt due to grogginess and assist x1 available and decreased sitting tolerance at EOB Toileting- Clothing Manipulation and Hygiene: Total assistance, Bed level Functional mobility during ADLs: Maximal assistance (to EOB) General ADL Comments: patient requiring increased assistance with self care due to decreased cognition, safety awareness, activity tolerance, balance,  Cognition: Cognition Overall Cognitive Status: Impaired/Different from baseline Orientation Level: Oriented to person Cognition Arousal/Alertness: Awake/alert, Lethargic (increased arousal with sitting EOB) Behavior During  Therapy: WFL for tasks assessed/performed Overall Cognitive Status: Impaired/Different from baseline Area of Impairment: Orientation, Safety/judgement, Following commands, Attention, Problem solving Orientation Level: Disoriented to, Time, Situation (cant state in hospital, not name of place) Current Attention Level: Sustained Following Commands: Follows one step commands with increased time Safety/Judgement: Decreased awareness of safety, Decreased awareness of deficits Awareness: Intellectual Problem Solving: Slow processing, Difficulty sequencing, Requires verbal cues, Requires tactile cues General Comments: Pt oriented to self only, stating he was at home. Drowsy throughout session, keeping eyes closed. Pt following ~25% of 1 step commands with max repetition and stimulation  Blood pressure 127/69, pulse 92, temperature 98.1 F (36.7 C), temperature source Oral, resp. rate 15, height 6' (1.829 m), weight 93.4 kg, SpO2 96 %. Physical Exam Vitals and nursing note reviewed.  Constitutional:      Appearance: He is ill-appearing.     Comments: Somnolent and arouses briefly. Snoring and was unable to stay awake.   HENT:     Head: Normocephalic and atraumatic.  Eyes:     General: No scleral icterus.       Right eye: No discharge.        Left eye: No discharge.     Conjunctiva/sclera: Conjunctivae normal.     Pupils: Pupils are equal, round, and reactive to light.  Cardiovascular:     Rate and Rhythm: Normal rate and regular rhythm.     Pulses: Normal pulses.     Heart sounds: Normal heart sounds. No murmur heard.   Pulmonary:     Effort: Pulmonary effort is normal. No respiratory distress.     Breath sounds: Normal breath sounds. No stridor.  Abdominal:     General: Abdomen is flat. Bowel sounds are normal. There is no distension.     Palpations: Abdomen is soft. There is no mass.  Skin:    General: Skin is warm and dry.  Neurological:     Mental Status: He is lethargic.      Comments: Difficulty to arouse. Dysarthric speech. Oriented to self only and thought that he was in [place].  Able to orient to hospital with cues. Marland Kitchen He was able to state his name, age and DOB.   Manual muscle testing difficult to perform secondary to decreased level of alertness.  The patient has antigravity strength in the  right upper extremity, he has 2 - strength in the left upper extremity Right lower extremity 0/5 left lower extremity 2 -/5 Sensation difficult to determine secondary to level of alertness.  He does wince when the fingers of the right hand are pinched but not on the left  Nonambulatory  Psychiatric:        Attention and Perception: He is inattentive.        Speech: Speech is delayed.     Results for orders placed or performed during the hospital encounter of 11/15/19 (from the past 24 hour(s))  Lipid panel     Status: Abnormal   Collection Time: 11/19/19 11:41 AM  Result Value Ref Range   Cholesterol 101 0 - 200 mg/dL   Triglycerides 82 <150 mg/dL   HDL 26 (L) >40 mg/dL   Total CHOL/HDL Ratio 3.9 RATIO   VLDL 16 0 - 40 mg/dL   LDL Cholesterol 59 0 - 99 mg/dL  Troponin I (High Sensitivity)     Status: Abnormal   Collection Time: 11/19/19 11:41 AM  Result Value Ref Range   Troponin I (High Sensitivity) 1,252 (HH) <18 ng/L  Glucose, capillary     Status: Abnormal   Collection Time: 11/19/19 12:01 PM  Result Value Ref Range   Glucose-Capillary 147 (H) 70 - 99 mg/dL  Urinalysis, Routine w reflex microscopic     Status: Abnormal   Collection Time: 11/19/19 12:15 PM  Result Value Ref Range   Color, Urine YELLOW YELLOW   APPearance CLEAR CLEAR   Specific Gravity, Urine 1.019 1.005 - 1.030   pH 6.0 5.0 - 8.0   Glucose, UA NEGATIVE NEGATIVE mg/dL   Hgb urine dipstick SMALL (A) NEGATIVE   Bilirubin Urine NEGATIVE NEGATIVE   Ketones, ur 5 (A) NEGATIVE mg/dL   Protein, ur 30 (A) NEGATIVE mg/dL   Nitrite NEGATIVE NEGATIVE   Leukocytes,Ua NEGATIVE NEGATIVE   RBC  / HPF 0-5 0 - 5 RBC/hpf   WBC, UA 0-5 0 - 5 WBC/hpf   Bacteria, UA NONE SEEN NONE SEEN  Troponin I (High Sensitivity)     Status: Abnormal   Collection Time: 11/19/19  1:03 PM  Result Value Ref Range   Troponin I (High Sensitivity) 1,081 (HH) <18 ng/L  Glucose, capillary     Status: Abnormal   Collection Time: 11/19/19  4:10 PM  Result Value Ref Range   Glucose-Capillary 124 (H) 70 - 99 mg/dL  Glucose, capillary     Status: Abnormal   Collection Time: 11/19/19  9:37 PM  Result Value Ref Range   Glucose-Capillary 124 (H) 70 - 99 mg/dL  Glucose, capillary     Status: Abnormal   Collection Time: 11/20/19  6:32 AM  Result Value Ref Range   Glucose-Capillary 128 (H) 70 - 99 mg/dL  Glucose, capillary     Status: Abnormal   Collection Time: 11/20/19  8:26 AM  Result Value Ref Range   Glucose-Capillary 131 (H) 70 - 99 mg/dL   EEG  Result Date: 11/19/2019 Lora Havens, MD     11/19/2019 12:43 PM Patient Name: [name] MRN: [id number] Epilepsy Attending: Lora Havens Referring Physician/Provider: Dr Irene Pap Date: 11/19/2019 Duration: 24.33 mins Patient history: 86yo M with persistent encephalopathy after closed right hip fracture post surgical repair on 11/16/2019. EEG to evaluate for seizure. EEG to evaluate for seizure Level of alertness: Awake, asleep AEDs during EEG study: Pregabalin Technical aspects: This EEG study was done with scalp electrodes positioned according  to the 10-20 International system of electrode placement. Electrical activity was acquired at a sampling rate of 500Hz  and reviewed with a high frequency filter of 70Hz  and a low frequency filter of 1Hz . EEG data were recorded continuously and digitally stored. Description: The posterior dominant rhythm consists of 8 Hz activity of moderate voltage (25-35 uV) seen predominantly in posterior head regions, symmetric and reactive to eye opening and eye closing. Sleep was characterized by vertex waves, sleep spindles  (12 to 14 Hz), maximal frontocentral region. EEG showed intermittent generalized 3 to 6 Hz theta-delta slowing. Hyperventilation and photic stimulation were not performed.   ABNORMALITY -Intermittent slow, generalized IMPRESSION: This study is suggestive of mild diffuse encephalopathy nonspecific etiology. No seizures or epileptiform discharges were seen throughout the recording. Lora Havens   CT HEAD WO CONTRAST  Result Date: 11/18/2019 CLINICAL DATA:  Encephalopathy EXAM: CT HEAD WITHOUT CONTRAST TECHNIQUE: Contiguous axial images were obtained from the base of the skull through the vertex without intravenous contrast. COMPARISON:  Head CT 09/25/2019, brain MRI 09/11/2019. FINDINGS: Brain: Stable, mild generalized parenchymal atrophy. Redemonstrated chronic small vessel infarcts within the corona radiata/basal ganglia, thalami and cerebellum. Stable background advanced patchy and confluent hypoattenuation within the cerebral white matter which is nonspecific, but consistent with chronic small vessel ischemic disease. To a lesser degree, chronic small vessel ischemic changes are also present within the pons. There is no acute intracranial hemorrhage. No acute demarcated cortical infarct is identified. No extra-axial fluid collection. No evidence of intracranial mass. No midline shift. Vascular: No hyperdense vessel.  Atherosclerotic calcifications. Skull: Normal. Negative for fracture or focal lesion. Sinuses/Orbits: Visualized orbits show no acute finding. Tiny left maxillary sinus mucous retention cyst. Minimal ethmoid sinus mucosal thickening. No significant mastoid effusion. IMPRESSION: No CT evidence of acute intracranial abnormality. Stable appearance of advanced chronic small vessel ischemic disease. Redemonstrated remote small-vessel infarcts within the corona radiata/basal ganglia, thalami and cerebellum. Tiny left maxillary sinus mucous retention cyst. Minimal ethmoid sinus mucosal thickening.  Electronically Signed   By: Kellie Simmering DO   On: 11/18/2019 14:36   MR BRAIN WO CONTRAST  Result Date: 11/19/2019 CLINICAL DATA:  Not alert for 3 days. EXAM: MRI HEAD WITHOUT CONTRAST TECHNIQUE: Multiplanar, multiecho pulse sequences of the brain and surrounding structures were obtained without intravenous contrast. COMPARISON:  11/18/2019 head CT.  09/11/2019 MRI head. FINDINGS: Brain: Moderate cerebral atrophy with ex vacuo dilatation. Advanced chronic microvascular ischemic changes predominantly involving the periventricular and subcortical white matter. Sequela of remote lacunar infarcts involving the corona radiata, basal ganglia, thalami and cerebellum. There are foci of restricted diffusion with correlative T2/FLAIR hyperintense signal involving the right cerebellum. Scattered punctate foci of restricted diffusion are seen within the medial left parietal lobe (3:31, 32). No intracranial hemorrhage. No midline shift or extra-axial fluid collection. No mass lesion. Vascular: The major arterial flow voids are grossly unremarkable. Skull and upper cervical spine: Normal marrow signal. Sinuses/Orbits: Bilateral optic nerve sheath distension (6:11), unchanged since 2019. Bilateral lens replacement. Mild ethmoid sinus mucosal thickening. No mastoid effusion. Other: None. IMPRESSION: Multifocal acute right cerebellar infarcts. Punctate acute medial left parietal lobe infarct. Advanced chronic microvascular ischemic changes and sequela of remote infarcts involving the corona radiata, basal ganglia, thalami and cerebellum. Moderate cerebral atrophy. Unchanged bilateral papilledema. These results were called by telephone at the time of interpretation on 11/19/2019 at 4:03 pm to provider [facility] , who verbally acknowledged these results. Electronically Signed   By: Milus Mallick.D.  On: 11/19/2019 16:09   DG CHEST PORT 1 VIEW  Result Date: 11/19/2019 CLINICAL DATA:  Hypoxia, post hip surgery EXAM:  PORTABLE CHEST 1 VIEW COMPARISON:  Portable exam 0835 hours compared to 09/11/2019 FINDINGS: Loop recorder projects over LEFT heart. Borderline enlargement of cardiac silhouette post CABG. Mediastinal contours and pulmonary vascularity normal. Mild bibasilar atelectasis. Remaining lungs clear. No pleural effusion or pneumothorax. Bones demineralized. IMPRESSION: Mild bibasilar atelectasis. Electronically Signed   By: Lavonia Dana M.D.   On: 11/19/2019 10:19   DG Swallowing Func-Speech Pathology  Result Date: 11/19/2019 Objective Swallowing Evaluation: Type of Study: MBS-Modified Barium Swallow Study  Patient Details Name: ROWE WARMAN MRN: [id number] Date of Birth: 1934/01/18 Today's Date: 11/19/2019 Time: SLP Start Time (ACUTE ONLY): 7408 -SLP Stop Time (ACUTE ONLY): 1016 SLP Time Calculation (min) (ACUTE ONLY): 18 min Past Medical History: Past Medical History: Diagnosis Date . Aneurysm of iliac artery ([facility])  . Colon polyps  . Coronary atherosclerosis of unspecified type of vessel, native or graft  . Diabetes ([place])  . Difficult intubation  . Duodenal ulcer disease 08/15/2018 . Esophageal reflux  . High cholesterol  . History of IBS 02/27/2009 . Hypertension   pt denies, he says he has a h/o hypotension. If BP up he adjusts the Florinef . Jejunitis with partial SBO 05/20/2017 . Orthostatic hypotension   "BP has been dropping alot when I stand up for the last month or so" (02/17/2016) . Prostatitis  . Upper GI bleed 06/24/2018 . Vitamin B 12 deficiency  . Vitamin D deficiency  Past Surgical History: Past Surgical History: Procedure Laterality Date . BIOPSY  06/25/2018  Procedure: BIOPSY;  Surgeon: Rush Landmark Telford Nab., MD;  Location: [facility];  Service: Gastroenterology;; . BIOPSY  04/24/2019  Procedure: BIOPSY;  Surgeon: Irving Copas., MD;  Location: [facility];  Service: Gastroenterology;; . BLADDER SURGERY  1969  traumatic pelvic fractures, urethral and bladder repair . CARDIAC CATHETERIZATION  N/A 07/01/2015  Procedure: Left Heart Cath and Coronary Angiography;  Surgeon: Wellington Hampshire, MD;  Location: [facility];  Service: Cardiovascular;  Laterality: N/A; . COLON RESECTION N/A 05/17/2017  Procedure: DIAGNOSTIC LAPAROSCOPY,;  Surgeon: Leighton Ruff, MD;  [facility];  Service: General;  Laterality: N/A; . COLONOSCOPY WITH PROPOFOL N/A 04/24/2019  Procedure: COLONOSCOPY WITH PROPOFOL;  Surgeon: Irving Copas., MD;  [facility];  Service: Gastroenterology;  Laterality: N/A; . CORONARY ANGIOPLASTY WITH STENT PLACEMENT   . CORONARY ARTERY BYPASS GRAFT N/A 07/06/2015  Procedure: CORONARY ARTERY BYPASS GRAFTING (CABG)x 4   utilizing the left internal mammary artery and endoscopically harvested bilateral  sapheneous vein.;  Surgeon: Ivin Poot, MD;  Location: [facility];  Service: Open Heart Surgery;  Laterality: N/A; . ESOPHAGEAL DILATION  04/24/2019  Procedure: ESOPHAGEAL DILATION;  Surgeon: Rush Landmark Telford Nab., MD;  Location: [facility];  Service: Gastroenterology;; . ESOPHAGOGASTRODUODENOSCOPY (EGD) WITH PROPOFOL N/A 06/25/2018  Procedure: ESOPHAGOGASTRODUODENOSCOPY (EGD) WITH PROPOFOL;  Surgeon: Irving Copas., MD;  Location: [place];  Service: Gastroenterology;  Laterality: N/A; . ESOPHAGOGASTRODUODENOSCOPY (EGD) WITH PROPOFOL N/A 04/24/2019  Procedure: ESOPHAGOGASTRODUODENOSCOPY (EGD) WITH PROPOFOL;  Surgeon: Rush Landmark Telford Nab., MD;  Location: [facility];  Service: Gastroenterology;  Laterality: N/A; . FEMUR IM NAIL Right 11/16/2019  Procedure: RIGHT HIP INTRAMEDULLARY (IM) NAIL FEMORAL;  Surgeon: Meredith Pel, MD;  Location: [place];  Service: Orthopedics;  Laterality: Right; . HEMOSTASIS CLIP PLACEMENT  04/24/2019  Procedure: HEMOSTASIS CLIP PLACEMENT;  Surgeon: Irving Copas., MD;  Location: [facility];  Service: Gastroenterology;; . KNEE SURGERY   . LOOP RECORDER INSERTION N/A 02/19/2018  Procedure: LOOP RECORDER INSERTION;   Surgeon: Deboraha Sprang, MD;  Location: [facility];  Service: Cardiovascular;  Laterality: N/A; . POLYPECTOMY  04/24/2019  Procedure: POLYPECTOMY;  Surgeon: Rush Landmark Telford Nab., MD;  Location: [facility];  Service: Gastroenterology;; . Lia Foyer LIFTING INJECTION  04/24/2019  Procedure: SUBMUCOSAL LIFTING INJECTION;  Surgeon: Irving Copas., MD;  Location: [facility];  Service: Gastroenterology;; . TEE WITHOUT CARDIOVERSION N/A 07/06/2015  Procedure: TRANSESOPHAGEAL ECHOCARDIOGRAM (TEE);  Surgeon: Ivin Poot, MD;  Location: [place];  Service: Open Heart Surgery;  Laterality: N/A; HPI:  86 y.o. male with a history of diet-controlled type 2 diabetes with stage III chronic kidney disease and diabetic neuropathy, coronary artery disease status post bypass, CVA.  Patient presents with right hip fx after a mechanical fall.  Underwent right hip IM nail placement 7/17. Now with acute metabolic encephalopathy.  [name] present, she reports hx of increasing swallowing difficulty in the last several months, with coughing/choking during meals.  Subjective: lethargic Assessment / Plan / Recommendation [facility] IP CLINICAL IMPRESSIONS 11/19/2019 Clinical Impression Pt has a moderate oropharyngeal dysphagia, also needing Mod cues for arousal throughout session. His labial seal is weak around a cup, as liquids spill down his chin. He has improved seal with a straw and spoon but still has slow lingual manipulation, taking multiple, pumping efforts to push boluses posteriorly. He takes rest breaks while masticating solids. Bolus cohesion is poor especially with liquids of all consistencies. Swallow initiation is triggered at the pyriform sinuses with thin and nectar thick liquids with silent entrance into the airway before the swallow. Anterior hyoid movement, laryngeal elevation, and laryngeal vestibule closure are also all reduced. Airway protection is not improved with changing bolus delivery or attempts at  a chin tuck, but his lethargy also limits his ability to follow postural strategies. SLP provided tactile assist to manipulate a chin down position. Honey thick liquids via straw enter the airway as well but not when administered via spoon. Recommend Dys 3 (mechanical soft) diet and honey thick liquids by cup.  SLP Visit Diagnosis Dysphagia, oropharyngeal phase (R13.12) Attention and concentration deficit following -- Frontal lobe and executive function deficit following -- Impact on safety and function Moderate aspiration risk   [facility] IP TREATMENT RECOMMENDATION 11/19/2019 Treatment Recommendations Therapy as outlined in treatment plan below   Prognosis 11/19/2019 Prognosis for Safe Diet Advancement Good Barriers to Reach Goals Cognitive deficits Barriers/Prognosis Comment -- [facility] IP DIET RECOMMENDATION 11/19/2019 SLP Diet Recommendations Dysphagia 3 (Mech soft) solids;Honey thick liquids Liquid Administration via Cup;No straw Medication Administration Whole meds with puree Compensations Slow rate;Small sips/bites Postural Changes Seated upright at 90 degrees   [facility] IP OTHER RECOMMENDATIONS 11/19/2019 Recommended Consults -- Oral Care Recommendations Oral care BID Other Recommendations Remove water pitcher;Prohibited food (jello, ice cream, thin soups);Order thickener from pharmacy   [facility] IP FOLLOW UP RECOMMENDATIONS 11/19/2019 Follow up Recommendations Skilled Nursing facility   [facility] IP FREQUENCY AND DURATION 11/19/2019 Speech Therapy Frequency (ACUTE ONLY) min 2x/week Treatment Duration 2 weeks      [facility] IP ORAL PHASE 11/19/2019 Oral Phase Impaired Oral - Pudding Teaspoon -- Oral - Pudding Cup -- Oral - Honey Teaspoon -- Oral - Honey Cup Left anterior bolus loss;Right anterior bolus loss;Weak lingual manipulation;Lingual pumping;Reduced posterior propulsion;Lingual/palatal residue;Delayed oral transit;Decreased bolus cohesion Oral - Nectar Teaspoon -- Oral - Nectar Cup Left anterior bolus loss;Right anterior bolus loss;Weak  lingual manipulation;Lingual  pumping;Reduced posterior propulsion;Lingual/palatal residue;Delayed oral transit;Decreased bolus cohesion;Premature spillage Oral - Nectar Straw Left anterior bolus loss;Right anterior bolus loss;Weak lingual manipulation;Lingual pumping;Reduced posterior propulsion;Lingual/palatal residue;Delayed oral transit;Decreased bolus cohesion;Premature spillage Oral - Thin Teaspoon -- Oral - Thin Cup Left anterior bolus loss;Right anterior bolus loss;Weak lingual manipulation;Lingual pumping;Reduced posterior propulsion;Lingual/palatal residue;Delayed oral transit;Decreased bolus cohesion;Premature spillage Oral - Thin Straw Left anterior bolus loss;Right anterior bolus loss;Weak lingual manipulation;Lingual pumping;Reduced posterior propulsion;Lingual/palatal residue;Delayed oral transit;Decreased bolus cohesion;Premature spillage Oral - Puree Weak lingual manipulation;Lingual pumping;Reduced posterior propulsion;Lingual/palatal residue;Delayed oral transit;Decreased bolus cohesion Oral - Mech Soft Weak lingual manipulation;Lingual pumping;Reduced posterior propulsion;Lingual/palatal residue;Delayed oral transit;Decreased bolus cohesion;Impaired mastication Oral - Regular -- Oral - Multi-Consistency -- Oral - Pill -- Oral Phase - Comment --  [facility] IP PHARYNGEAL PHASE 11/19/2019 Pharyngeal Phase Impaired Pharyngeal- Pudding Teaspoon -- Pharyngeal -- Pharyngeal- Pudding Cup -- Pharyngeal -- Pharyngeal- Honey Teaspoon -- Pharyngeal -- Pharyngeal- Honey Cup Reduced anterior laryngeal mobility;Reduced laryngeal elevation;Reduced airway/laryngeal closure;Delayed swallow initiation-vallecula Pharyngeal -- Pharyngeal- Nectar Teaspoon -- Pharyngeal -- Pharyngeal- Nectar Cup Reduced anterior laryngeal mobility;Reduced laryngeal elevation;Reduced airway/laryngeal closure;Delayed swallow initiation-pyriform sinuses;Penetration/Aspiration before swallow Pharyngeal Material enters airway, passes BELOW cords  without attempt by patient to eject out (silent aspiration) Pharyngeal- Nectar Straw Reduced anterior laryngeal mobility;Reduced laryngeal elevation;Reduced airway/laryngeal closure;Delayed swallow initiation-pyriform sinuses;Penetration/Aspiration before swallow Pharyngeal Material enters airway, passes BELOW cords without attempt by patient to eject out (silent aspiration) Pharyngeal- Thin Teaspoon -- Pharyngeal -- Pharyngeal- Thin Cup Reduced anterior laryngeal mobility;Reduced laryngeal elevation;Reduced airway/laryngeal closure;Penetration/Aspiration before swallow;Delayed swallow initiation-pyriform sinuses Pharyngeal Material enters airway, passes BELOW cords without attempt by patient to eject out (silent aspiration) Pharyngeal- Thin Straw Reduced anterior laryngeal mobility;Reduced laryngeal elevation;Reduced airway/laryngeal closure;Delayed swallow initiation-pyriform sinuses;Penetration/Aspiration before swallow Pharyngeal Material enters airway, passes BELOW cords without attempt by patient to eject out (silent aspiration) Pharyngeal- Puree Reduced anterior laryngeal mobility;Reduced laryngeal elevation;Reduced airway/laryngeal closure;Delayed swallow initiation-vallecula Pharyngeal -- Pharyngeal- Mechanical Soft Reduced anterior laryngeal mobility;Reduced laryngeal elevation;Reduced airway/laryngeal closure;Delayed swallow initiation-vallecula Pharyngeal -- Pharyngeal- Regular -- Pharyngeal -- Pharyngeal- Multi-consistency -- Pharyngeal -- Pharyngeal- Pill -- Pharyngeal -- Pharyngeal Comment --  No flowsheet data found. Osie Bond., M.A. CCC-SLP Acute Rehabilitation Services Pager [phone] Office [phone] 11/19/2019, 11:53 AM              ECHOCARDIOGRAM COMPLETE  Result Date: 11/19/2019    ECHOCARDIOGRAM REPORT   Patient Name:   [name] Date of Exam: 11/19/2019 Medical Rec #:  [id number]         Height:       72.0 in Accession #:    [id number]        Weight:       180.0 lb Date of  Birth:  1933-07-16         BSA:          2.037 m Patient Age:    27 years          BP:           134/63 mmHg Patient Gender: M                 HR:           90 bpm. Exam Location:  Inpatient Procedure: 2D Echo, Cardiac Doppler and Color Doppler Indications:    Elevated Troponin  History:        Patient has prior history of Echocardiogram examinations, most                 recent 02/01/2018. Risk Factors:Hypertension, Diabetes,  Dyslipidemia and GERD.  Sonographer:    Jonelle Sidle Dance Referring Phys: [id number] [place]  1. Left ventricular ejection fraction, by estimation, is 60 to 65%. The left ventricle has normal function. The left ventricle has no regional wall motion abnormalities. There is moderate concentric left ventricular hypertrophy. Left ventricular diastolic parameters are consistent with Grade II diastolic dysfunction (pseudonormalization). Elevated left atrial pressure.  2. Right ventricular systolic function is normal. The right ventricular size is normal. There is normal pulmonary artery systolic pressure. The estimated right ventricular systolic pressure is 69.6 mmHg.  3. Left atrial size was mildly dilated.  4. The mitral valve is degenerative. No evidence of mitral valve regurgitation. No evidence of mitral stenosis.  5. The aortic valve is tricuspid. Aortic valve regurgitation is not visualized. No aortic stenosis is present.  6. The inferior vena cava is normal in size with greater than 50% respiratory variability, suggesting right atrial pressure of 3 mmHg. Comparison(s): No significant change from prior study. FINDINGS  Left Ventricle: Left ventricular ejection fraction, by estimation, is 60 to 65%. The left ventricle has normal function. The left ventricle has no regional wall motion abnormalities. The left ventricular internal cavity size was normal in size. There is  moderate concentric left ventricular hypertrophy. Left ventricular diastolic parameters are  consistent with Grade II diastolic dysfunction (pseudonormalization). Elevated left atrial pressure. Right Ventricle: The right ventricular size is normal. No increase in right ventricular wall thickness. Right ventricular systolic function is normal. There is normal pulmonary artery systolic pressure. The tricuspid regurgitant velocity is 2.50 m/s, and  with an assumed right atrial pressure of 3 mmHg, the estimated right ventricular systolic pressure is 78.9 mmHg. Left Atrium: Left atrial size was mildly dilated. Right Atrium: Right atrial size was normal in size. Pericardium: Trivial pericardial effusion is present. Presence of pericardial fat pad. Mitral Valve: The mitral valve is degenerative in appearance. There is mild calcification of the posterior and anterior mitral valve leaflet(s). Mild mitral annular calcification. No evidence of mitral valve regurgitation. No evidence of mitral valve stenosis. Tricuspid Valve: The tricuspid valve is grossly normal. Tricuspid valve regurgitation is mild . No evidence of tricuspid stenosis. Aortic Valve: The aortic valve is tricuspid. Aortic valve regurgitation is not visualized. No aortic stenosis is present. Pulmonic Valve: The pulmonic valve was grossly normal. Pulmonic valve regurgitation is mild. No evidence of pulmonic stenosis. Aorta: The aortic root and ascending aorta are structurally normal, with no evidence of dilitation. Venous: The inferior vena cava is normal in size with greater than 50% respiratory variability, suggesting right atrial pressure of 3 mmHg. IAS/Shunts: The atrial septum is grossly normal.  LEFT VENTRICLE PLAX 2D LVIDd:         4.50 cm  Diastology LVIDs:         3.70 cm  LV e' lateral:   7.29 cm/s LV PW:         1.70 cm  LV E/e' lateral: 13.6 LV IVS:        1.50 cm  LV e' medial:    4.68 cm/s LVOT diam:     2.00 cm  LV E/e' medial:  21.1 LV SV:         72 LV SV Index:   35 LVOT Area:     3.14 cm  RIGHT VENTRICLE             IVC RV Basal diam:   2.70 cm     IVC diam: 1.70 cm RV S  prime:     10.10 cm/s TAPSE (M-mode): 1.5 cm LEFT ATRIUM             Index       RIGHT ATRIUM           Index LA diam:        4.70 cm 2.31 cm/m  RA Area:     16.40 cm LA Vol (A2C):   68.1 ml 33.43 ml/m RA Volume:   37.90 ml  18.60 ml/m LA Vol (A4C):   79.3 ml 38.92 ml/m LA Biplane Vol: 76.7 ml 37.65 ml/m  AORTIC VALVE LVOT Vmax:   120.00 cm/s LVOT Vmean:  73.350 cm/s LVOT VTI:    0.228 m  AORTA Ao Root diam: 3.70 cm Ao Asc diam:  3.30 cm MITRAL VALVE               TRICUSPID VALVE MV Area (PHT): 3.42 cm    TR Peak grad:   25.0 mmHg MV Decel Time: 222 msec    TR Vmax:        250.00 cm/s MV E velocity: 98.80 cm/s MV A velocity: 96.70 cm/s  SHUNTS MV E/A ratio:  1.02        Systemic VTI:  0.23 m                            Systemic Diam: 2.00 cm Eleonore Chiquito MD Electronically signed by Eleonore Chiquito MD Signature Date/Time: 11/19/2019/3:38:06 PM    Final      Assessment/Plan: Diagnosis: Right IT hip fracture with perioperative embolic infarcts 1. Does the need for close, 24 hr/day medical supervision in concert with the patient's rehab needs make it unreasonable for this patient to be served in a less intensive setting? No 2. Co-Morbidities requiring supervision/potential complications: CKD, diabetes, cerebrovascular disease due to bladder management, bowel management, safety, skin/wound care, disease management, medication administration, pain management and patient education, does the patient require 24 hr/day rehab nursing? Yes 3. Does the patient require coordinated care of a physician, rehab nurse, therapy disciplines of PT, OT, speech to address physical and functional deficits in the context of the above medical diagnosis(es)? Potentially Addressing deficits in the following areas: balance, endurance, locomotion, strength, transferring, bowel/bladder control, bathing, dressing, feeding, grooming, toileting, cognition, swallowing and psychosocial support 4. Can  the patient actively participate in an intensive therapy program of at least 3 hrs of therapy per day at least 5 days per week? No 5. The potential for patient to make measurable gains while on inpatient rehab is poor 6. Anticipated functional outcomes upon discharge from inpatient rehab are n/a  with PT, n/a with OT, n/a with SLP. 7. Estimated rehab length of stay to reach the above functional goals is: Not applicable 8. Anticipated discharge destination: Other 9. Overall Rehab/Functional Prognosis: poor  RECOMMENDATIONS: This patient's condition is appropriate for continued rehabilitative care in the following setting: SNF Patient has agreed to participate in recommended program. N/A Note that insurance prior authorization may be required for reimbursement for recommended care.  Comment: Not able to participate or progress in an intensive rehabilitation program  Bary Leriche, PA-C 11/20/2019   "I have personally performed a face to face diagnostic evaluation of this patient.  Additionally, I have reviewed and concur with the physician assistant's documentation above." Charlett Blake M.D. [facility] FAAPM&R (Neuromuscular Med) Diplomate Am Board of Electrodiagnostic Med Fellow Am Board of Interventional Pain

## 2019-11-19 NOTE — Procedures (Signed)
Patient Name: CAMDAN BURDI  MRN: [id number]  Epilepsy Attending: Lora Havens  Referring Physician/Provider: Dr Irene Pap Date: 11/19/2019 Duration: 24.33 mins  Patient history: 86yo M with persistent encephalopathy after closed right hip fracture post surgical repair on 11/16/2019. EEG to evaluate for seizure. EEG to evaluate for seizure  Level of alertness: Awake, asleep  AEDs during EEG study: Pregabalin  Technical aspects: This EEG study was done with scalp electrodes positioned according to the 10-20 International system of electrode placement. Electrical activity was acquired at a sampling rate of 500Hz  and reviewed with a high frequency filter of 70Hz  and a low frequency filter of 1Hz . EEG data were recorded continuously and digitally stored.   Description: The posterior dominant rhythm consists of 8 Hz activity of moderate voltage (25-35 uV) seen predominantly in posterior head regions, symmetric and reactive to eye opening and eye closing. Sleep was characterized by vertex waves, sleep spindles (12 to 14 Hz), maximal frontocentral region. EEG showed intermittent generalized 3 to 6 Hz theta-delta slowing. Hyperventilation and photic stimulation were not performed.     ABNORMALITY -Intermittent slow, generalized  IMPRESSION: This study is suggestive of mild diffuse encephalopathy nonspecific etiology. No seizures or epileptiform discharges were seen throughout the recording.  Jara Feider Barbra Sarks

## 2019-11-19 NOTE — Progress Notes (Signed)
Lab notified of ABG being sent down. RT will continue to monitor.

## 2019-11-19 NOTE — Progress Notes (Signed)
  Echocardiogram 2D Echocardiogram has been performed.  [name] [name] 11/19/2019, 2:45 PM

## 2019-11-19 NOTE — Progress Notes (Signed)
EEG Completed; Results Pending  

## 2019-11-19 NOTE — Progress Notes (Signed)
Late Entry- Dr. Nevada Crane contacted at [phone] to notify of this RN's concerns including drowsiness and occasional slurred speech.  Patient able to answer questions appropriately when asked and is intermittently alert and oriented to person and place, but not able to state the year.  Patient noted to cough after taking sips of water.  Patient's wife states that patient was sleepy before his fall, but was more oriented than he currently is.  Wife states that she feels anesthesia is taking a long time to wear off due to conversation with MD and that patient is not "good with taking medications because they have a strong effect on him:.  Orders received for ST consult and CT of brain.  MD states she will reassess patient this afternoon.

## 2019-11-19 NOTE — NC FL2 (Signed)
[place] LEVEL OF CARE SCREENING TOOL     IDENTIFICATION  Patient Name: [name] Birthdate: 05/06/1933 Sex: male Admission Date (Current Location): 11/15/2019  [facility] and [place] Number:  Herbalist and Address:  The . [facility], [place] [address], [place], [place] [address]      Provider Number: [id number]  Attending Physician Name and Address:  Kayleen Memos, DO  Relative Name and Phone Number:  Estill Bamberg [phone]    Current Level of Care: Hospital Recommended Level of Care: [place] Prior Approval Number:    Date Approved/Denied:   PASRR Number: [id number]  Discharge Plan: SNF    Current Diagnoses: Patient Active Problem List   Diagnosis Date Noted  . Closed right hip fracture ([place]) 11/15/2019  . Unilateral primary osteoarthritis, right knee 11/13/2019  . At high risk for falls 05/29/2019  . Chronic bilateral low back pain without sciatica 02/07/2019  . Statin intolerance 11/16/2018  . Hyperlipidemia, mixed 08/15/2018  . Low back pain 07/18/2018  . History of lacunar cerebrovascular accident (CVA) 02/12/2018  . Dizziness 02/12/2018  . Myofascial pain 12/18/2017  . Spondylosis without myelopathy or radiculopathy, lumbar region 12/18/2017  . Lumbar radiculopathy, right 12/18/2017  . Cholelithiasis 05/17/2017  . Sinus Bradycardia 05/17/2017  . Thoracic radiculopathy 04/12/2017  . Primary osteoarthritis of right knee 12/14/2016  . DDD (degenerative disc disease), lumbar 10/10/2016  . Lumbar facet arthropathy 08/29/2016  . Idiopathic scoliosis 03/22/2016  . Diabetic neuropathy ([place]) 03/22/2016  . Dysautonomia orthostatic hypotension syndrome ([place]) 02/25/2016  . CKD stage 3 due to type 2 diabetes mellitus ([place]) 02/19/2016  . Coronary artery disease involving coronary bypass graft of native heart without angina pectoris   . Diabetes mellitus type 2, diet-controlled ([place])   . Intercostal  neuralgia 12/24/2015  . Medication management 08/11/2015  . 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 PVD (peripheral vascular disease) ([place]) 01/01/2014  . Hyperlipidemia associated with type 2 diabetes mellitus ([place]) 04/11/2013  . Supine hypertension   . Vitamin D deficiency   . Vitamin B 12 deficiency   . Coronary atherosclerosis- s/p PCI to LAD in 2009 and PCI to RCA in 2011 02/27/2009  . Abdominal aortic aneurysm ([place]) 02/27/2009  . GERD 02/27/2009    Orientation RESPIRATION BLADDER Height & Weight     Self, Situation, Place  Normal Incontinent, External catheter (External Urinary Catheter) Weight: 180 lb (81.6 kg) Height:  6' (182.9 cm)  BEHAVIORAL SYMPTOMS/MOOD NEUROLOGICAL BOWEL NUTRITION STATUS      Continent Diet (See Discharge Summary)  AMBULATORY STATUS COMMUNICATION OF NEEDS Skin   Extensive Assist Verbally Surgical wounds, Skin abrasions, Other (Comment) (Appropriate for ethnicity,dry,surgical incision abrasion elbow right eccymosis location arm left non-tenting incision closed hip right)                       Personal Care Assistance Level of Assistance  Bathing, Feeding, Dressing Bathing Assistance: Maximum assistance Feeding assistance: Maximum assistance Dressing Assistance: Maximum assistance     Functional Limitations Info  Sight, Hearing, Speech Sight Info: Adequate Hearing Info: Adequate Speech Info: Adequate    SPECIAL CARE FACTORS FREQUENCY  PT (By licensed PT), OT (By licensed OT)     PT Frequency: 5x min weekly OT Frequency: 5x min weekly            Contractures Contractures Info: Not present    Additional Factors Info  Code Status, Allergies Code Status Info: DNR Allergies Info:  Other(Pt reports allergic to a medication but unsure of name or type of med) ,Cymbalta,Keflex,Simvastatin,Sudafed   Insulin Sliding Scale Info: insulin aspart (novoLOG) injection 0-5 Units daily at bedtime; insulin aspart (novoLOG) injection 0-9 Units 3x daily with  meals       Current Medications (11/19/2019):  This is the current hospital active medication list Current Facility-Administered Medications  Medication Dose Route Frequency Provider Last Rate Last Admin  . 0.9 %  sodium chloride infusion   Intravenous Continuous Kayleen Memos, DO 50 mL/hr at 11/19/19 0302 New Bag at 11/19/19 0302  . acetaminophen (TYLENOL) tablet 1,000 mg  1,000 mg Oral Q6H PRN Irene Pap N, DO   1,000 mg at 11/18/19 9150  . aspirin EC tablet 81 mg  81 mg Oral Daily Irene Pap N, DO   81 mg at 11/19/19 1100  . docusate sodium (COLACE) capsule 100 mg  100 mg Oral BID Magnant, Charles L, PA-C   100 mg at 11/19/19 1036  . fludrocortisone (FLORINEF) tablet 0.1 mg  0.1 mg Oral Daily Truett Mainland, DO   0.1 mg at 11/19/19 1036  . HYDROmorphone (DILAUDID) injection 0.5 mg  0.5 mg Intravenous Q4H PRN Magnant, Charles L, PA-C      . insulin aspart (novoLOG) injection 0-9 Units  0-9 Units Subcutaneous Q4H Irene Pap N, DO   1 Units at 11/19/19 1214  . menthol-cetylpyridinium (CEPACOL) lozenge 3 mg  1 lozenge Oral PRN Magnant, Charles L, PA-C       Or  . phenol (CHLORASEPTIC) mouth spray 1 spray  1 spray Mouth/Throat PRN Magnant, Charles L, PA-C      . methocarbamol (ROBAXIN) tablet 500 mg  500 mg Oral Q6H PRN Magnant, Charles L, PA-C       Or  . methocarbamol (ROBAXIN) 500 mg in dextrose 5 % 50 mL IVPB  500 mg Intravenous Q6H PRN Magnant, Charles L, PA-C      . metoCLOPramide (REGLAN) tablet 5-10 mg  5-10 mg Oral Q8H PRN Magnant, Charles L, PA-C       Or  . metoCLOPramide (REGLAN) injection 5-10 mg  5-10 mg Intravenous Q8H PRN Magnant, Charles L, PA-C      . metoprolol tartrate (LOPRESSOR) injection 5 mg  5 mg Intravenous Q5 min PRN Lang Snow, FNP      . ondansetron (ZOFRAN) tablet 4 mg  4 mg Oral Q6H PRN Magnant, Charles L, PA-C       Or  . ondansetron (ZOFRAN) injection 4 mg  4 mg Intravenous Q6H PRN Magnant, Charles L, PA-C      . oxyCODONE (Oxy IR/ROXICODONE)  immediate release tablet 5-10 mg  5-10 mg Oral Q4H PRN Magnant, Charles L, PA-C      . pantoprazole (PROTONIX) EC tablet 40 mg  40 mg Oral Daily Truett Mainland, DO   40 mg at 11/19/19 1036  . polyethylene glycol (MIRALAX / GLYCOLAX) packet 17 g  17 g Oral Daily Truett Mainland, DO   17 g at 11/19/19 1035  . pravastatin (PRAVACHOL) tablet 40 mg  40 mg Oral q1800 Truett Mainland, DO   40 mg at 11/18/19 1800  . pregabalin (LYRICA) capsule 100 mg  100 mg Oral TID Truett Mainland, DO   100 mg at 11/19/19 1036  . rivaroxaban (XARELTO) tablet 10 mg  10 mg Oral Q supper Kayleen Memos, DO         Discharge Medications: Please see discharge summary for a list of  discharge medications.  Relevant Imaging Results:  Relevant Lab Results:   Additional Information [phone]  Trula Ore, LCSWA

## 2019-11-19 NOTE — Progress Notes (Signed)
This Probation officer has been informed by lab that pt 's troponin 1,194. Attending MD made aware and  Received an order for EKG stat. Pt remains in no acute distress. Denies any chest pain/palpitations/discomfort at this time. No complaints voiced.

## 2019-11-19 NOTE — Progress Notes (Signed)
Pt has to be transferred to 3E08 as ordered, Pt alert/oriented in no acute distress . Report given to [facility]. Questions were answered satisfactorily. Family at bedside with no complaints.charge nurse/ADON aware

## 2019-11-19 NOTE — Progress Notes (Signed)
Acute CVA on MRI with prior hx of CVA in 2019, likely troponin S elevation 2/2 to acute CVA.  Stroke team consulted to assist with the management.

## 2019-11-19 NOTE — Telephone Encounter (Signed)
Attempted to reach patient's wife x 2 on only # listed; received message: "no routes found".

## 2019-11-19 NOTE — Progress Notes (Signed)
Orthopedic Tech Progress Note Patient Details:  [name] October 04, 1933 [id number] Went to put patient on CPM but at this time he had other things going on. WIFE/DAUGHTER asked if I could come back later. I said yes ma'am. Notified RN Patient ID: [name], male   DOB: Sep 16, 1933, 86 y.o.   MRN: [id number]   [name] 11/19/2019, 11:04 AM

## 2019-11-19 NOTE — Progress Notes (Signed)
PT Cancellation Note  Patient Details Name: [name] MRN: [id number] DOB: Jul 27, 1933   Cancelled Treatment:    Reason Eval/Treat Not Completed: (P) Patient at procedure or test/unavailable   [name] [name] 11/19/2019, 4:37 PM  Erasmo Leventhal , PTA Acute Rehabilitation Services Pager [phone] Office [phone]

## 2019-11-19 NOTE — Consult Note (Addendum)
Neurology Consultation  Reason for Consult: Altered mental status/stroke Referring Physician: Dr. Nevada Crane  CC: Altered mental status/stroke  History is obtained from: Family and chart  HPI: [name] is a 86 y.o. male with past medical history of upper GI bleed, TIA, stroke, hypertension, diabetes, carotid disease and CAD.  Patient presented to the ED on 11/15/2019 with mechanical fall after tripping when getting out of the car.  X-ray showed a right intertrochanteric hip fracture.  He was taken to the OR on 11/16/2019 for repair.  He was started on Xarelto for DVT prophylaxis.  Postoperatively he has remained very drowsy and lethargic.  Initially was thought it was metabolic encephalopathy or residual anesthetic.  However patient has not resumed alertness since his operation.  Thus MRI was obtained and EEG was also obtained.  EEG showed diffuse encephalopathy nonspecific etiology but no seizure activity.  MRI revealed multifocal acute right cerebellar infarcts.  Punctate acute medial left parietal lobe infarct.  Ammonia was checked and returned normal at 14.  LKW: 11/16/2019 tpa given?: no, out of window Premorbid modified Rankin scale (mRS): 0 NIH stroke score of 12   Past Medical History:  Diagnosis Date  . AAA (abdominal aortic aneurysm) ([place])   . 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 CAD (coronary artery disease)    a. s/p CABGx4 in 07/2015.  . Carotid artery disease ([place])   . Colon polyps   . Diabetes ([place])   . Difficult intubation   . 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 Esophageal reflux   . High cholesterol   . History of IBS 02/27/2009  . Hypertension    pt denies, he says he has a h/o hypotension. If BP up he adjusts the [place]  . Jejunitis with partial SBO 05/20/2017  . Orthostatic hypotension   . Prostatitis   . Stroke ([place])   . TIA (transient ischemic attack)   . Upper GI bleed 06/24/2018  . Vitamin B 12 deficiency   . Vitamin D deficiency     Family History   Problem Relation Age of Onset  . Heart attack Father        died age 16  . Heart attack Brother        died age 36  . Anuerysm Brother        aortic  . Heart attack Sister        died age 24  . Colon cancer Sister   . Liver cancer Sister   . Diabetes Maternal Grandmother   . Arthritis Mother   . Dementia Mother   . Heart Problems Brother   . Heart Problems Brother   . Heart Problems Brother   . Heart Problems Brother   . Healthy Daughter   . Healthy Son   . Esophageal cancer Neg Hx   . Inflammatory bowel disease Neg Hx   . Pancreatic cancer Neg Hx   . Stomach cancer Neg Hx    Social History:   reports that he quit smoking about 56 years ago. He quit smokeless tobacco use about 32 years ago.  His smokeless tobacco use included chew. He reports that he does not drink alcohol and does not use drugs.  Medications  Current Facility-Administered Medications:  .  0.9 %  sodium chloride infusion, , Intravenous, Continuous, Kayleen Memos, DO, Last Rate: 50 mL/hr at 11/19/19 0302, New Bag at 11/19/19 0302 .  acetaminophen (TYLENOL) tablet 1,000 mg, 1,000 mg, Oral, Q6H PRN, Kayleen Memos, DO,  1,000 mg at 11/18/19 0639 .  aspirin EC tablet 81 mg, 81 mg, Oral, Daily, Hall, Carole N, DO, 81 mg at 11/19/19 1100 .  docusate sodium (COLACE) capsule 100 mg, 100 mg, Oral, BID, Magnant, Charles L, PA-C, 100 mg at 11/19/19 1036 .  fludrocortisone (FLORINEF) tablet 0.1 mg, 0.1 mg, Oral, Daily, Truett Mainland, DO, 0.1 mg at 11/19/19 1036 .  HYDROmorphone (DILAUDID) injection 0.5 mg, 0.5 mg, Intravenous, Q4H PRN, Magnant, Charles L, PA-C .  insulin aspart (novoLOG) injection 0-9 Units, 0-9 Units, Subcutaneous, Q4H, Hall, Carole N, DO, 1 Units at 11/19/19 1214 .  menthol-cetylpyridinium (CEPACOL) lozenge 3 mg, 1 lozenge, Oral, PRN **OR** phenol (CHLORASEPTIC) mouth spray 1 spray, 1 spray, Mouth/Throat, PRN, Magnant, Charles L, PA-C .  methocarbamol (ROBAXIN) tablet 500 mg, 500 mg, Oral, Q6H PRN  **OR** methocarbamol (ROBAXIN) 500 mg in dextrose 5 % 50 mL IVPB, 500 mg, Intravenous, Q6H PRN, Magnant, Charles L, PA-C .  metoCLOPramide (REGLAN) tablet 5-10 mg, 5-10 mg, Oral, Q8H PRN **OR** metoCLOPramide (REGLAN) injection 5-10 mg, 5-10 mg, Intravenous, Q8H PRN, Magnant, Charles L, PA-C .  metoprolol tartrate (LOPRESSOR) injection 5 mg, 5 mg, Intravenous, Q5 min PRN, Lang Snow, FNP .  ondansetron (ZOFRAN) tablet 4 mg, 4 mg, Oral, Q6H PRN **OR** ondansetron (ZOFRAN) injection 4 mg, 4 mg, Intravenous, Q6H PRN, Magnant, Charles L, PA-C .  oxyCODONE (Oxy IR/ROXICODONE) immediate release tablet 5-10 mg, 5-10 mg, Oral, Q4H PRN, Magnant, Charles L, PA-C .  pantoprazole (PROTONIX) EC tablet 40 mg, 40 mg, Oral, Daily, Truett Mainland, DO, 40 mg at 11/19/19 1036 .  polyethylene glycol (MIRALAX / GLYCOLAX) packet 17 g, 17 g, Oral, Daily, Truett Mainland, DO, 17 g at 11/19/19 1035 .  pravastatin (PRAVACHOL) tablet 40 mg, 40 mg, Oral, q1800, Truett Mainland, DO, 40 mg at 11/19/19 1710 .  pregabalin (LYRICA) capsule 100 mg, 100 mg, Oral, TID, Truett Mainland, DO, 100 mg at 11/19/19 1710 .  rivaroxaban (XARELTO) tablet 10 mg, 10 mg, Oral, Q supper, Hall, Carole N, DO, 10 mg at 11/19/19 1710  ROS:  Unable to obtain due to altered mental status.    Exam: Current vital signs: BP (!) 157/83 (BP Location: Left Arm)   Pulse (!) 101   Temp 99.4 F (37.4 C) (Oral)   Resp 17   Ht 6' (1.829 m)   Wt 81.6 kg   SpO2 96%   BMI 24.41 kg/m  Vital signs in last 24 hours: Temp:  [97.9 F (36.6 C)-99.4 F (37.4 C)] 99.4 F (37.4 C) (07/20 1607) Pulse Rate:  [97-109] 101 (07/20 1607) Resp:  [16-18] 17 (07/20 1607) BP: (134-167)/(63-83) 157/83 (07/20 1607) SpO2:  [96 %-100 %] 96 % (07/20 1607)   Constitutional: Appears well-developed and well-nourished.  Eyes: No scleral injection HENT: No OP obstrucion Head: Normocephalic.  Cardiovascular: Palpable.  Respiratory: Effort normal, non-labored  breathing GI: Soft.  No distension. There is no tenderness.  Skin: WDI  Neuro: Mental Status: Patient is very drowsy at this time.  He is aware that he is in the hospital and that is 2021 other than that I could not get him answer other questions.  Patient continued to close his eyes and needed continuous stimulation.  He was able to follow commands if stimulated.  Although the commands were very simple. Cranial Nerves: II: Visual Fields are full.  III,IV, VI: EOMI without ptosis or diploplia. Pupils equal, round and reactive to light V: Facial sensation is symmetric to  temperature VII: Left facial droop VIII: hearing is intact to voice X: Palat elevates symmetrically XI: Shoulder shrug is symmetric. XII: tongue is midline without atrophy or fasciculations.  Motor: This was very difficult as patient was very somnolent.  He was able to hold his left arm antigravity.  He allowed his right arm to drift.  He is unable to move his right leg.  He has drift on his left leg Sensory: Withdraws to noxious stimuli Deep Tendon Reflexes: 2+ and symmetric in the biceps and patellae.  Bilateral upper extremities and left knee jerk Plantars: Toes are downgoing bilaterally.  Cerebellar: Unable to obtain  Labs I have reviewed labs in epic and the results pertinent to this consultation are:   CBC    Component Value Date/Time   WBC 10.4 11/19/2019 0443   RBC 2.55 (L) 11/19/2019 0443   HGB 8.2 (L) 11/19/2019 0443   HCT 25.3 (L) 11/19/2019 0443   PLT 174 11/19/2019 0443   MCV 99.2 11/19/2019 0443   MCH 32.2 11/19/2019 0443   MCHC 32.4 11/19/2019 0443   RDW 15.9 (H) 11/19/2019 0443   LYMPHSABS 1.6 11/15/2019 1542   MONOABS 0.4 11/15/2019 1542   EOSABS 0.3 11/15/2019 1542   BASOSABS 0.1 11/15/2019 1542    CMP     Component Value Date/Time   NA 140 11/18/2019 0116   K 3.7 11/18/2019 0116   CL 107 11/18/2019 0116   CO2 26 11/18/2019 0116   GLUCOSE 119 (H) 11/18/2019 0116   BUN 26 (H)  11/18/2019 0116   CREATININE 1.43 (H) 11/18/2019 0116   CREATININE 2.02 (H) 08/29/2019 1416   CALCIUM 7.9 (L) 11/18/2019 0116   PROT 5.8 (L) 11/18/2019 1445   ALBUMIN 2.7 (L) 11/18/2019 1445   AST 25 11/18/2019 1445   ALT 12 11/18/2019 1445   ALKPHOS 64 11/18/2019 1445   BILITOT 1.0 11/18/2019 1445   GFRNONAA 45 (L) 11/18/2019 0116   GFRNONAA 29 (L) 08/29/2019 1416   GFRAA 52 (L) 11/18/2019 0116   GFRAA 34 (L) 08/29/2019 1416    Lipid Panel     Component Value Date/Time   CHOL 101 11/19/2019 1141   CHOL 176 10/03/2019 1444   TRIG 82 11/19/2019 1141   HDL 26 (L) 11/19/2019 1141   HDL 34 (L) 10/03/2019 1444   CHOLHDL 3.9 11/19/2019 1141   VLDL 16 11/19/2019 1141   LDLCALC 59 11/19/2019 1141   LDLCALC 121 (H) 10/03/2019 1444   LDLCALC 143 (H) 08/29/2019 1416     Imaging I have reviewed the images obtained:  MRI revealed multifocal acute right cerebellar infarcts.  Punctate acute medial left parietal lobe infarct. Echocardiogram reveals an ejection fraction of 60 to 65%  Dennis Quill PA-C [facility] [phone]  M-F  (9:00 am- 5:00 PM)  11/19/2019, 5:28 PM    NEUROHOSPITALIST ADDENDUM Performed a face to face diagnostic evaluation.   86 year old male with past medical history of coronary artery disease, peripheral vascular disease, multiple strokes, hypertension, hyperlipidemia, diabetes mellitus with neuropathy, vitamin B12 deficiency admitted after fall resulting in right intertrochanteric hip fracture status post surgery 11/16/19.  Since the procedure patient has been more lethargic-and therefore MRI brain and EEG performed by hospital service. MRI brain reveals acute right cerebellar infarct as well as a small left parietal lobe infarct-raising suspicion for embolic stroke-likely cardioembolic. EEG was performed which showed generalized slowing.  Neurology consulted for further recommendations.  Prior stroke history includes multiple lacunar  infarcts-thought to be secondary to small vessel  disease in the setting of uncontrolled vascular risk factors such as diabetes, hypertension, hyperlipidemia.  Also it appears patient has mild dementia from prior notes and family was asking about how to diagnose dementia.  Patient on  Lyrica 100mg   3 times daily for diabetic polyneuropathy.  Narcotics has been ordered for postsurgical pain, however patient has not been receiving it atleast for last 24 hours.   Exam:  On examination patient is lethargic but arousable, alert and oriented to person and month.  Confused as to location and reason why he is in the hospital.  Follows commands intermittently, raises both upper extremities against gravity and wiggles both feet.  Due to lethargy, does not cooperate fully with other neurological testing.   Impression:  Acute embolic right cerebellar and left parietal lobe infarcts Acute toxic metabolic encephalopathy-possibly from Lyrica Delirium with possible underlying dementia   Recommendations Reduce Lyrica 100 mg TID to 100 mg once daily Echocardiogram and loop recorder to monitor for paroxysmal A. Fib Okay to start Xarelto for DVT prophylaxis PT OT Swallow evaluation Already has received an A1c and lipid profile recently MRA and  carotid Dopplers   Stroke team to follow    Dennis Addison Cecille Mcclusky MD [facility] [phone]   If 7pm to 7am, please call on call as listed on AMION.

## 2019-11-19 NOTE — Discharge Instructions (Signed)
Information on my medicine - XARELTO (Rivaroxaban) 10 mg once daily  This medication education was reviewed with me or my healthcare representative as part of my discharge preparation.   Why was Xarelto prescribed for you? Xarelto was prescribed for you to reduce the risk of blood clots forming after orthopedic surgery. The medical term for these abnormal blood clots is venous thromboembolism (VTE).  What do you need to know about xarelto ? Take your Xarelto ONCE DAILY at the same time every day. You may take it either with or without food.  If you have difficulty swallowing the tablet whole, you may crush it and mix in applesauce just prior to taking your dose.  Take Xarelto exactly as prescribed by your doctor and DO NOT stop taking Xarelto without talking to the doctor who prescribed the medication.  Stopping without other VTE prevention medication to take the place of Xarelto may increase your risk of developing a clot.  After discharge, you should have regular check-up appointments with your healthcare provider that is prescribing your Xarelto.    What do you do if you miss a dose? If you miss a dose, take it as soon as you remember on the same day then continue your regularly scheduled once daily regimen the next day. Do not take two doses of Xarelto on the same day.   Important Safety Information A possible side effect of Xarelto is bleeding. You should call your healthcare provider right away if you experience any of the following: ? Bleeding from an injury or your nose that does not stop. ? Unusual colored urine (red or dark brown) or unusual colored stools (red or black). ? Unusual bruising for unknown reasons. ? A serious fall or if you hit your head (even if there is no bleeding).  Some medicines may interact with Xarelto and might increase your risk of bleeding while on Xarelto. To help avoid this, consult your healthcare provider or pharmacist prior to using any  new prescription or non-prescription medications, including herbals, vitamins, non-steroidal anti-inflammatory drugs (NSAIDs) and supplements.  This website has more information on Xarelto: [email or web address]

## 2019-11-19 NOTE — Plan of Care (Signed)

## 2019-11-19 NOTE — Progress Notes (Signed)
PROGRESS NOTE  [name] EXH:[id number] DOB: 1934/03/15 DOA: 11/15/2019 PCP: Dennis Pinto, MD  HPI/Recap of past 24 hours: Dennis Heck [name] a 86 y.o.malewith a history of diet-controlled type 2 diabetes with stage IIIA chronic kidney disease and diabetic neuropathy, coronary artery disease status post PCI with stents and CABG,prior CVA.Presents with right hip pain after a mechanical fall as he was trying to get out of the car. Pain started immediately after the fall. EMS was called and the patient was brought to the emergency department for evaluation.  X-rays show a right intertrochanteric hip fracture.  Post right hip repair on 11/16/2019 by Dr. Marlou Sa.  On Xarelto for DVT prophylaxis stopped on 7/18 due to acute blood loss.  Hospital course complicated by lethargy post surgery.  Per his wife he has had progressive decline in his cognitive abilities since his stroke.  Received 1 unit PRBC on 7/18 due to drop of his hemoglobin from 11.8 to 7.0.  Xarelto was held and ASA dose reduced to 81 mg daily per Dr. Randel Pigg recommendation.  Due to persistent encephalopathy, CT head was done on 7/19 and was unremarkable for any acute intracranial findings.  11/19/19: Still very somnolent this morning but arousable to voices and drifts back to sleep.  Very minimally interactive.  ABG showed no evidence of hypoxia or hypercarbia.  Ammonia level normal.  No hypoglycemia.  Chest x-ray showed bibasilar atelectasis.  Lactic acid unremarkable.  Afebrile with no leukocytosis.  UA is pending.  Troponin S is markedly elevated greater than 1100, cycling troponin x3.  Twelve-lead EKG, 2D echo and MRI brain ordered.  Due to persistent encephalopathy EEG was also ordered to rule out subclinical seizures.  Cardiology consulted due to his elevated troponin.    Assessment/Plan: Principal Problem:   Closed right hip fracture (HCC) Active Problems:   GERD   Supine hypertension   PVD (peripheral  vascular disease) (HCC)   Coronary artery disease involving coronary bypass graft of native heart without angina pectoris   Diabetes mellitus type 2, diet-controlled ([place])   CKD stage 3 due to type 2 diabetes mellitus (HCC)   Dysautonomia orthostatic hypotension syndrome (HCC)   Diabetic neuropathy (HCC)   History of lacunar cerebrovascular accident (CVA)   Closed right hip fracture post surgical repair on 11/16/2019 by Dr. Marlou Sa S/p Rt hip IM nail placement Management per orthopedic surgery PT assessment recommended SNF TOC assisting with SNF placement  Persistent acute metabolic encephalopathy of unclear etiology CT head unremarkable for any acute intracranial findings, shows old stroke Ammonia level normal, not hypoglycemic, not hypercarbic or hypoxic on ABG Obtain MRI brain and EEG UA pending Continue to closely monitor  Elevated troponin, suspect cardiogenic in the setting of coronary artery disease status post PCI with stent and CABG Encephalopathic and unable to provide a history Troponin is greater than 1100 Restart ASA 81 mg daily, continue Pravachol Cycle troponin x3 Obtain twelve-lead EKG Obtain 2D echo Obtain lipid panel Cardiology consulted  Acute blood loss anemia post surgery Baseline hemoglobin appears to be 11.8 Drop in hemoglobin on 7/8 from 11.8K to 7.9K, repeated hg 7.0 Transfused 1 unit PRBC>> Hg 7.5 post transfusion, repeat Hg 8.5  Xarelto held 7/18, ASA dose reduced to 81 mg daily Restarted Xarelto on 7/20 per ortho recs Hemoglobin 8.2 this morning. No overt bleeding.  Prediabetes with hyperglycemia likely exacerbated by chronic steroid use  Diet controlled On Florinef daily Hemoglobin A1c 6.1 Continue insulin sliding scale as needed Avoid hypoglycemia  Dysautonomia  orthostatic hypotension syndrome On Florinef daily Blood pressure is stable Continue to maintain MAP greater than 65 Continue to monitor vital signs  Dysphagia Speech therapist  recommended regular consistency diet with nectar thick liquid Plan for MBS today if condition improves Continue aspirations precautions  CKD stage IIIA Baseline creatinine appears to be 1.4 with GFR 44 Creatinine back to baseline on gentle IV fluid Continue to avoid nephrotoxins and dehydration UO recorded in the last 24 hours 800 cc. Continue to monitor urine output Repeat BMP in the morning  History of CVA Continue aspirin and Pravachol Prior to admission on 162 mg of aspirin due to failure of aspirin 81 mg with TIA, per neurology Aspirin dose reduced to 81 mg daily due to acute blood loss as stated above CT head without contrast no evidence of acute stroke or intracranial hemorrhage MRI ordered and pending.  Coronary artery disease status post CABG Encephalopathy therefore cannot provide a reliable history Denies any pain at this time Continue aspirin and statin  Ambulatory dysfunction post right hip repair PT recommended SNF TOC assisting with SNF placement Continue PT OT with assistance and fall precautions    DVT prophylaxis: ASA 81 mg daily, Xarelto for chemical DVT prophylaxis, SCDs Code Status: DNR Family Communication:  Updated his wife and daughter at bedside. Disposition Plan: SNF  Consult: Cardiology.   Status is: Inpatient  Remains inpatient appropriate because:Ongoing work-up for persistent acute metabolic encephalopathy post surgery.  Dispo: The patient is from: Home  Anticipated d/c is to: SNF  Anticipated d/c date is: 11/22/2019  Patient currently is not medically stable to d/c due to new elevation in troponin S and persistent acute metabolic encephalopathy.           Objective: Vitals:   11/18/19 2252 11/19/19 0004 11/19/19 0431 11/19/19 0728  BP: 137/68 135/68 (!) 145/69 135/65  Pulse: (!) 109 (!) 108 (!) 102 97  Resp:  16 16 16   Temp:  98.9 F (37.2 C) 99.3 F (37.4 C) 97.9 F (36.6 C)    TempSrc:  Oral Oral Oral  SpO2:  96% 100% 100%  Weight:      Height:        Intake/Output Summary (Last 24 hours) at 11/19/2019 1052 Last data filed at 11/19/2019 0732 Gross per 24 hour  Intake --  Output 1650 ml  Net -1650 ml   Filed Weights   11/15/19 1405  Weight: 81.6 kg    Exam:  . General: 86 y.o. year-old male well-developed well-nourished in no acute distress.  Very somnolent but arousable to voices and drifts back to sleep.   . Cardiovascular: Regular rate and rhythm no rubs or gallops.  No JVD or thyromegaly noted.   Marland Kitchen Respiratory: Mild rales at bases no wheezing noted.  Poor inspiratory effort. .   Abdomen: Soft nontender to palpation, bowel sounds present.   . Musculoskeletal: Right lateral thigh edematous.  No bruising or erythema noted.  Marland Kitchen Psychiatry: Unable to assess mood due to somnolence.  Data Reviewed: CBC: Recent Labs  Lab 11/15/19 1542 11/15/19 1542 11/16/19 0002 11/16/19 0002 11/17/19 0220 11/17/19 1040 11/18/19 0116 11/18/19 1445 11/19/19 0443  WBC 6.9  --  13.7*  --  13.4*  --  9.6  --  10.4  NEUTROABS 4.5  --   --   --   --   --   --   --   --   HGB 11.8*   < > 11.8*   < > 7.9* 7.0*  7.5* 8.5* 8.2*  HCT 37.3*   < > 38.2*   < > 25.1* 22.7* 23.9* 27.5* 25.3*  MCV 102.8*  --  102.4*  --  103.3*  --  99.6  --  99.2  PLT 172  --  181  --  142*  --  123*  --  174   < > = values in this interval not displayed.   Basic Metabolic Panel: Recent Labs  Lab 11/15/19 1542 11/16/19 0002 11/17/19 0220 11/18/19 0116  NA 142 142 138 140  K 3.9 4.8 4.2 3.7  CL 106 106 106 107  CO2 28 28 26 26   GLUCOSE 153* 154* 157* 119*  BUN 23 21 26* 26*  CREATININE 1.44* 1.53* 1.53* 1.43*  CALCIUM 8.6* 8.6* 8.1* 7.9*   GFR: Estimated Creatinine Clearance: 42.2 mL/min (A) (by C-G formula based on SCr of 1.43 mg/dL (H)). Liver Function Tests: Recent Labs  Lab 11/18/19 1445  AST 25  ALT 12  ALKPHOS 64  BILITOT 1.0  PROT 5.8*  ALBUMIN 2.7*   No results  for input(s): LIPASE, AMYLASE in the last 168 hours. Recent Labs  Lab 11/18/19 1445  AMMONIA 14   Coagulation Profile: Recent Labs  Lab 11/15/19 1542  INR 1.1   Cardiac Enzymes: No results for input(s): CKTOTAL, CKMB, CKMBINDEX, TROPONINI in the last 168 hours. BNP (last 3 results) No results for input(s): PROBNP in the last 8760 hours. HbA1C: No results for input(s): HGBA1C in the last 72 hours. CBG: Recent Labs  Lab 11/18/19 1623 11/18/19 2054 11/19/19 0045 11/19/19 0421 11/19/19 0824  GLUCAP 107* 135* 118* 118* 146*   Lipid Profile: No results for input(s): CHOL, HDL, LDLCALC, TRIG, CHOLHDL, LDLDIRECT in the last 72 hours. Thyroid Function Tests: Recent Labs    11/19/19 0908  TSH 1.468   Anemia Panel: No results for input(s): VITAMINB12, FOLATE, FERRITIN, TIBC, IRON, RETICCTPCT in the last 72 hours. Urine analysis:    Component Value Date/Time   COLORURINE YELLOW 09/11/2019 [place] 09/11/2019 1652   LABSPEC 1.020 09/11/2019 1652   PHURINE 5.0 09/11/2019 1652   GLUCOSEU NEGATIVE 09/11/2019 1652   HGBUR NEGATIVE 09/11/2019 1652   BILIRUBINUR NEGATIVE 09/11/2019 1652   KETONESUR NEGATIVE 09/11/2019 1652   PROTEINUR NEGATIVE 09/11/2019 1652   UROBILINOGEN 1.0 01/05/2015 1550   NITRITE NEGATIVE 09/11/2019 1652   LEUKOCYTESUR NEGATIVE 09/11/2019 1652   Sepsis Labs: @LABRCNTIP (procalcitonin:4,lacticidven:4)  ) Recent Results (from the past 240 hour(s))  SARS Coronavirus 2 by RT PCR (hospital order, performed in [place] hospital lab) Nasopharyngeal Nasopharyngeal Swab     Status: None   Collection Time: 11/15/19  3:30 PM   Specimen: Nasopharyngeal Swab  Result Value Ref Range Status   SARS Coronavirus 2 NEGATIVE NEGATIVE Final    Comment: (NOTE) SARS-CoV-2 target nucleic acids are NOT DETECTED.  The SARS-CoV-2 RNA is generally detectable in upper and lower respiratory specimens during the acute phase of infection. The  lowest concentration of SARS-CoV-2 viral copies this assay can detect is 250 copies / mL. A negative result does not preclude SARS-CoV-2 infection and should not be used as the sole basis for treatment or other patient management decisions.  A negative result may occur with improper specimen collection / handling, submission of specimen other than nasopharyngeal swab, presence of viral mutation(s) within the areas targeted by this assay, and inadequate number of viral copies (<250 copies / mL). A negative result must be combined with clinical observations, patient history, and  epidemiological information.  Fact Sheet for Patients:   StrictlyIdeas.no  Fact Sheet for Healthcare Providers: BankingDealers.co.za  This test is not yet approved or  cleared by the [place] FDA and has been authorized for detection and/or diagnosis of SARS-CoV-2 by FDA under an Emergency Use Authorization (EUA).  This EUA will remain in effect (meaning this test can be used) for the duration of the COVID-19 declaration under Section 564(b)(1) of the Act, 21 U.S.C. section 360bbb-3(b)(1), unless the authorization is terminated or revoked sooner.  Performed at [facility], [address] [place], [place] [address]   MRSA PCR Screening     Status: None   Collection Time: 11/16/19  1:03 AM   Specimen: Nasal Mucosa; Nasopharyngeal  Result Value Ref Range Status   MRSA by PCR NEGATIVE NEGATIVE Final    Comment:        The GeneXpert MRSA Assay (FDA approved for NASAL specimens only), is one component of a comprehensive MRSA colonization surveillance program. It is not intended to diagnose MRSA infection nor to guide or monitor treatment for MRSA infections. Performed at [facility], [place] [address], [place],  [address]       Studies: CT HEAD WO CONTRAST  Result Date: 11/18/2019 CLINICAL DATA:  Encephalopathy EXAM: CT HEAD WITHOUT  CONTRAST TECHNIQUE: Contiguous axial images were obtained from the base of the skull through the vertex without intravenous contrast. COMPARISON:  Head CT 09/25/2019, brain MRI 09/11/2019. FINDINGS: Brain: Stable, mild generalized parenchymal atrophy. Redemonstrated chronic small vessel infarcts within the corona radiata/basal ganglia, thalami and cerebellum. Stable background advanced patchy and confluent hypoattenuation within the cerebral white matter which is nonspecific, but consistent with chronic small vessel ischemic disease. To a lesser degree, chronic small vessel ischemic changes are also present within the pons. There is no acute intracranial hemorrhage. No acute demarcated cortical infarct is identified. No extra-axial fluid collection. No evidence of intracranial mass. No midline shift. Vascular: No hyperdense vessel.  Atherosclerotic calcifications. Skull: Normal. Negative for fracture or focal lesion. Sinuses/Orbits: Visualized orbits show no acute finding. Tiny left maxillary sinus mucous retention cyst. Minimal ethmoid sinus mucosal thickening. No significant mastoid effusion. IMPRESSION: No CT evidence of acute intracranial abnormality. Stable appearance of advanced chronic small vessel ischemic disease. Redemonstrated remote small-vessel infarcts within the corona radiata/basal ganglia, thalami and cerebellum. Tiny left maxillary sinus mucous retention cyst. Minimal ethmoid sinus mucosal thickening. Electronically Signed   By: Kellie Simmering DO   On: 11/18/2019 14:36   DG CHEST PORT 1 VIEW  Result Date: 11/19/2019 CLINICAL DATA:  Hypoxia, post hip surgery EXAM: PORTABLE CHEST 1 VIEW COMPARISON:  Portable exam 0835 hours compared to 09/11/2019 FINDINGS: Loop recorder projects over LEFT heart. Borderline enlargement of cardiac silhouette post CABG. Mediastinal contours and pulmonary vascularity normal. Mild bibasilar atelectasis. Remaining lungs clear. No pleural effusion or pneumothorax. Bones  demineralized. IMPRESSION: Mild bibasilar atelectasis. Electronically Signed   By: Lavonia Dana M.D.   On: 11/19/2019 10:19    Scheduled Meds: . aspirin EC  81 mg Oral Daily  . docusate sodium  100 mg Oral BID  . fludrocortisone  0.1 mg Oral Daily  . insulin aspart  0-9 Units Subcutaneous Q4H  . pantoprazole  40 mg Oral Daily  . polyethylene glycol  17 g Oral Daily  . pravastatin  40 mg Oral q1800  . pregabalin  100 mg Oral TID  . rivaroxaban  10 mg Oral Q supper    Continuous Infusions: . sodium chloride  50 mL/hr at 11/19/19 0302  . methocarbamol (ROBAXIN) IV       LOS: 4 days     Kayleen Memos, MD [facility] Pager [phone]  If 7PM-7AM, please contact night-coverage [email or web address] Password [facility] 11/19/2019, 10:52 AM

## 2019-11-19 NOTE — TOC Initial Note (Addendum)
Transition of Care [facility]) - Initial/Assessment Note    Patient Details  Name: [name] MRN: [id number] Date of Birth: 1933-12-20  Transition of Care [facility]) CM/SW Contact:    Trula Ore, [place] Phone Number: 11/19/2019, 3:22 PM  Clinical Narrative:                  CSW spoke with patients spouse Estill Bamberg and daughter Delane at bedside. Patients spouse wants him to go to CIR. CSW let patients spouse  know she will reach out to CIR. CSW asked patients spouse if it was alright to go ahead and fax out initial referral for short term rehab in case patient doesn't qualify for CIR. Patients spouse Estill Bamberg gave CSW permission to fax out initial referral for SNF to [place] area. CSW reached out to CIR to let them know that family wants CIR. CSW will need to start insurance authorization if family chooses to go to SNF.  CSW will continue to follow.    Barriers to Discharge: Continued Medical Work up   Patient Goals and CMS Choice   CMS Medicare.gov Compare Post Acute Care list provided to:: Patient Represenative (must comment) (Spouse Estill Bamberg) Choice offered to / list presented to : Spouse  Expected Discharge Plan and Services         Living arrangements for the past 2 months: Single Family Home                                      Prior Living Arrangements/Services Living arrangements for the past 2 months: Single Family Home Lives with:: Spouse Patient language and need for interpreter reviewed:: Yes Do you feel safe going back to the place where you live?: No   CIR  Need for Family Participation in Patient Care: Yes (Comment) Care giver support system in place?: Yes (comment)   Criminal Activity/Legal Involvement Pertinent to Current Situation/Hospitalization: No - Comment as needed  Activities of Daily Living Home Assistive Devices/Equipment: Walker (specify type) ADL Screening (condition at time of admission) Patient's cognitive ability adequate to safely  complete daily activities?: Yes Is the patient deaf or have difficulty hearing?: No Does the patient have difficulty seeing, even when wearing glasses/contacts?: No Does the patient have difficulty concentrating, remembering, or making decisions?: No Patient able to express need for assistance with ADLs?: Yes Does the patient have difficulty dressing or bathing?: Yes Independently performs ADLs?: No Does the patient have difficulty walking or climbing stairs?: Yes Weakness of Legs: Right Weakness of Arms/Hands: None  Permission Sought/Granted Permission sought to share information with : Case Manager, Family Supports, Customer service manager    Share Information with NAME: Estill Bamberg  Permission granted to share info w AGENCY: CIR/SNF  Permission granted to share info w Relationship: Spouse  Permission granted to share info w Contact Information: Estill Bamberg [phone]  Emotional Assessment       Orientation: : Oriented to Self, Oriented to Place, Oriented to Situation Alcohol / Substance Use: Not Applicable Psych Involvement: No (comment)  Admission diagnosis:  Closed right hip fracture ([place]) [S72.001A] Closed fracture of trochanter of right femur, initial encounter ([place]) [S72.101A] Fall, initial encounter [W19.XXXA] Patient Active Problem List   Diagnosis Date Noted  . Closed right hip fracture ([place]) 11/15/2019  . Unilateral primary osteoarthritis, right knee 11/13/2019  . At high risk for falls 05/29/2019  . Chronic bilateral low back pain without sciatica 02/07/2019  .  Statin intolerance 11/16/2018  . Hyperlipidemia, mixed 08/15/2018  . Low back pain 07/18/2018  . History of lacunar cerebrovascular accident (CVA) 02/12/2018  . Dizziness 02/12/2018  . Myofascial pain 12/18/2017  . Spondylosis without myelopathy or radiculopathy, lumbar region 12/18/2017  . Lumbar radiculopathy, right 12/18/2017  . Cholelithiasis 05/17/2017  . Sinus Bradycardia 05/17/2017  . Thoracic  radiculopathy 04/12/2017  . Primary osteoarthritis of right knee 12/14/2016  . DDD (degenerative disc disease), lumbar 10/10/2016  . Lumbar facet arthropathy 08/29/2016  . Idiopathic scoliosis 03/22/2016  . Diabetic neuropathy ([place]) 03/22/2016  . Dysautonomia orthostatic hypotension syndrome ([place]) 02/25/2016  . 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 Coronary artery disease involving coronary bypass graft of native heart without angina pectoris   . Diabetes mellitus type 2, diet-controlled ([place])   . 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 Medication management 08/11/2015  . 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 PVD (peripheral vascular disease) ([place]) 01/01/2014  . Hyperlipidemia associated with type 2 diabetes mellitus ([place]) 04/11/2013  . Supine hypertension   . Vitamin D deficiency   . Vitamin B 12 deficiency   . Coronary atherosclerosis- s/p PCI to LAD in 2009 and PCI to RCA in 2011 02/27/2009  . Abdominal aortic aneurysm ([place]) 02/27/2009  . GERD 02/27/2009   PCP:  [name], MD Pharmacy:   [id number] - [place], [place] - [address] [place] [address] Phone: [phone] Fax: [phone]     Social Determinants of Health (SDOH) Interventions    Readmission Risk Interventions No flowsheet data found.

## 2019-11-19 NOTE — TOC CAGE-AID Note (Signed)
Transition of Care [name]) - CAGE-AID Screening   Patient Details  Name: [name] MRN: [id number] Date of Birth: 05-22-1933  Transition of Care [facility]) CM/SW Contact:    Emeterio Reeve, [place] Phone Number: 11/19/2019, 12:35 PM   Clinical Narrative:  Pt was unable to participate in assessment due to pt being drowsy.    CAGE-AID Screening: Substance Abuse Screening unable to be completed due to: : Patient unable to participate                    [place] Clinical Social Worker [phone]

## 2019-11-19 NOTE — Consult Note (Addendum)
Cardiology Consultation:   Patient ID: ERMON SAGAN MRN: [id number]; DOB: [birth year]  Admit date: 11/15/2019 Date of Consult: 11/19/2019  Primary Care Provider: [name], MD [facility] Cardiologist: Kirk Ruths, MD  [facility] Electrophysiologist:  [name], MD   Patient Profile:   AMAURIE WANDEL is a 86 y.o. male with a hx of CAD s/p CABG x 4 07/2015, previously normal LVEF, HTN with issues with orthostasis since CABG (requiring Florinef), stroke 2019 s/p ILR, TIA 08/2019 seen by neuro, 3.8cm AAA and abnormal bilateral iliac dilation by duplex 01/2019 (monitored by [facility]), GI bleed 06/2018 with stomach erosions/duodenal ulcers, DM, IBS, HLD, 40-59% bilateral carotid stenosis by duplex 10/2019, mild baseline dementia, CKD stage III by labs, PAT by ILR who is being seen today for the evaluation of elevated troponin at the request of Dr. Nevada Crane.  He was admitted with mechanical fall and hip fracture s/p repair, complicated by persistent encephalopathy.  History of Present Illness:   Mr. Kohan is followed Dr. Stanford Breed with above cardiac history. He also has an ILR in place since stroke in 2019 which per chart review has only captured rare atrial tach but no atrial fib. He had another TIA-type of event this spring and neurology increased his ASA to 162mg  daily. His wife reports some cognitive slowing and that he spends a lot of his time sleeping at home. However, he is typically engaging and interactive when awake.  He presented to the ED 11/15/2019 with mechanical fall after tripping shortly after getting out of the car and walking to the house. X-ray showed a right intertrochanteric hip fracture. He was taken to the OR on 11/16/19 for repair. He was started on Xarelto DVT prophylaxis. Post-operatively he was noted to have ABL anemia, initially requiring holding of Xarelto and transfusion of 1 U PRBC. Low dose Xarelto has subsequently been resumed and ASA dose lowered to 81mg   daily. Most notably however, post operatively, he has had persistent lethargy/drowsiness concerning for acute metabolic encephalopathy felt likely multifactorial in setting of CVA, baseline cognitive decline and anesthetic residual. CT head was done 11/18/19 which was nonacute. MRI brain pending. Medical workup of his encephalopathy was done showing normal ammonia level, lactic acid, WBC, TSH, procalcitonin. ABG without significant hypercarbia. EEG showed mild diffuse encephalopathy nonspecific etiology, no seizures. hsTroponin was also ordered which was [phone]. Last Cr 1.43 yesterday c/w known baseline. Also noted to have low grade temp elevation to 99.3. The patient remains drowsy. He is arousable for brief periods of time but falls back asleep quickly. Denies any CP, dyspnea, or any other acute symptoms. His wife and daughter are at bedside and indicate that he hadn't reported any chest pain.   Past Medical History:  Diagnosis Date  . AAA (abdominal aortic aneurysm) ([place])   . 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 CAD (coronary artery disease)    a. s/p CABGx4 in 07/2015.  . Carotid artery disease ([place])   . Colon polyps   . Diabetes ([place])   . Difficult intubation   . 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 Esophageal reflux   . High cholesterol   . History of IBS 02/27/2009  . Hypertension    pt denies, he says he has a h/o hypotension. If BP up he adjusts the Florinef  . Jejunitis with partial SBO 05/20/2017  . Orthostatic hypotension   . Prostatitis   . Stroke ([place])   . TIA (transient ischemic attack)   . Upper GI bleed 06/24/2018  .  Vitamin B 12 deficiency   . Vitamin D deficiency     Past Surgical History:  Procedure Laterality Date  . BIOPSY  06/25/2018   Procedure: BIOPSY;  Surgeon: Rush Landmark Telford Nab., MD;  Location: [place];  Service: Gastroenterology;;  . BIOPSY  04/24/2019   Procedure: BIOPSY;  Surgeon: Irving Copas., MD;  Location: [facility];  Service:  Gastroenterology;;  . BLADDER SURGERY  1969   traumatic pelvic fractures, urethral and bladder repair  . CARDIAC CATHETERIZATION N/A 07/01/2015   Procedure: Left Heart Cath and Coronary Angiography;  Surgeon: Wellington Hampshire, MD;  Location: [place] [facility];  Service: Cardiovascular;  Laterality: N/A;  . COLON RESECTION N/A 05/17/2017   Procedure: DIAGNOSTIC LAPAROSCOPY,;  Surgeon: Leighton Ruff, MD;  [facility];  Service: General;  Laterality: N/A;  . COLONOSCOPY WITH PROPOFOL N/A 04/24/2019   Procedure: COLONOSCOPY WITH PROPOFOL;  Surgeon: Irving Copas., MD;  [facility];  Service: Gastroenterology;  Laterality: N/A;  . CORONARY ANGIOPLASTY WITH STENT PLACEMENT    . CORONARY ARTERY BYPASS GRAFT N/A 07/06/2015   Procedure: CORONARY ARTERY BYPASS GRAFTING (CABG)x 4   utilizing the left internal mammary artery and endoscopically harvested bilateral  sapheneous vein.;  Surgeon: Ivin Poot, MD;  Location: [place];  Service: Open Heart Surgery;  Laterality: N/A;  . ESOPHAGEAL DILATION  04/24/2019   Procedure: ESOPHAGEAL DILATION;  Surgeon: Rush Landmark Telford Nab., MD;  [facility];  Service: Gastroenterology;;  . ESOPHAGOGASTRODUODENOSCOPY (EGD) WITH PROPOFOL N/A 06/25/2018   Procedure: ESOPHAGOGASTRODUODENOSCOPY (EGD) WITH PROPOFOL;  Surgeon: Irving Copas., MD;  Location: [place];  Service: Gastroenterology;  Laterality: N/A;  . ESOPHAGOGASTRODUODENOSCOPY (EGD) WITH PROPOFOL N/A 04/24/2019   Procedure: ESOPHAGOGASTRODUODENOSCOPY (EGD) WITH PROPOFOL;  Surgeon: Rush Landmark Telford Nab., MD;  [facility];  Service: Gastroenterology;  Laterality: N/A;  . FEMUR IM NAIL Right 11/16/2019   Procedure: RIGHT HIP INTRAMEDULLARY (IM) NAIL FEMORAL;  Surgeon: Meredith Pel, MD;  Location: [place];  Service: Orthopedics;  Laterality: Right;  . HEMOSTASIS CLIP PLACEMENT  04/24/2019   Procedure: HEMOSTASIS CLIP PLACEMENT;  Surgeon: Irving Copas., MD;  [facility];  Service: Gastroenterology;;  . KNEE SURGERY    . LOOP RECORDER INSERTION N/A 02/19/2018   Procedure: LOOP RECORDER INSERTION;  Surgeon: Deboraha Sprang, MD;  Location: [place] [facility];  Service: Cardiovascular;  Laterality: N/A;  . POLYPECTOMY  04/24/2019   Procedure: POLYPECTOMY;  Surgeon: Rush Landmark Telford Nab., MD;  Location: [facility];  Service: Gastroenterology;;  . Lia Foyer LIFTING INJECTION  04/24/2019   Procedure: SUBMUCOSAL LIFTING INJECTION;  Surgeon: Irving Copas., MD;  [facility];  Service: Gastroenterology;;  . TEE WITHOUT CARDIOVERSION N/A 07/06/2015   Procedure: TRANSESOPHAGEAL ECHOCARDIOGRAM (TEE);  Surgeon: Ivin Poot, MD;  Location: [place];  Service: Open Heart Surgery;  Laterality: N/A;     Home Medications:  Prior to Admission medications   Medication Sig Start Date End Date Taking? Authorizing Provider  acetaminophen (TYLENOL) 500 MG tablet Take 1,000 mg by mouth in the morning and at bedtime.    Yes [provider]  aspirin EC 81 MG tablet Take 2 tablets (162 mg total) by mouth daily. 10/03/19  Yes Garvin Fila, MD  Calcium Carb-Cholecalciferol (CALCIUM+D3 PO) Take 1 tablet by mouth daily.   Yes [provider]  Cholecalciferol (VITAMIN D-3) 5000 units TABS Take 5,000 Units by mouth daily.   Yes [provider]  ferrous sulfate 325 (65 FE) MG  tablet Take 325 mg by mouth daily with breakfast.   Yes [provider]  fludrocortisone (FLORINEF) 0.1 MG tablet Take 2 tablets Daily for Low BP Patient taking differently: Take 0.1 mg by mouth daily. Daily for Low BP 08/20/19  Yes [name], MD  glucose blood test strip Check blood sugar 1 time daily-DX-E11.9 Patient taking differently: 1 each by Other route as needed. Check blood sugar 1 time daily-DX-E11.9 07/07/17  Yes [name], MD  Multiple Vitamin (MULTIVITAMIN WITH MINERALS) TABS tablet Take 1 tablet  by mouth daily.   Yes [provider]  olmesartan (BENICAR) 40 MG tablet Take 20 mg by mouth daily as needed (for elevated BP).    Yes [provider]  Omega-3 Fatty Acids (FISH OIL) 600 MG CAPS Take 1 capsule by mouth daily.   Yes [provider]  pantoprazole (PROTONIX) 40 MG tablet Take 1 tablet Daily for Indigestion & Heartburn Patient taking differently: Take 40 mg by mouth daily.  03/11/19  Yes [name], MD  polyethylene glycol [facility] / Floria Raveling) packet Take 17 g by mouth daily. Patient taking differently: Take 17 g by mouth 2 (two) times daily.  05/22/17  Yes Barton Dubois, MD  pravastatin (PRAVACHOL) 40 MG tablet Take 1 tablet at Bedtime for Cholesterol 10/16/19  Yes [name], MD  pregabalin (LYRICA) 100 MG capsule Take 100 mg by mouth 3 (three) times daily.   Yes [provider]  Zinc 50 MG TABS Take 1 tablet by mouth daily.   Yes [provider]    Inpatient Medications: Scheduled Meds: . aspirin EC  81 mg Oral Daily  . docusate sodium  100 mg Oral BID  . fludrocortisone  0.1 mg Oral Daily  . insulin aspart  0-9 Units Subcutaneous Q4H  . pantoprazole  40 mg Oral Daily  . polyethylene glycol  17 g Oral Daily  . pravastatin  40 mg Oral q1800  . pregabalin  100 mg Oral TID  . rivaroxaban  10 mg Oral Q supper   Continuous Infusions: . sodium chloride 50 mL/hr at 11/19/19 0302  . methocarbamol (ROBAXIN) IV     PRN Meds: acetaminophen, HYDROmorphone (DILAUDID) injection, menthol-cetylpyridinium **OR** phenol, methocarbamol **OR** methocarbamol (ROBAXIN) IV, metoCLOPramide **OR** metoCLOPramide (REGLAN) injection, metoprolol tartrate, ondansetron **OR** ondansetron (ZOFRAN) IV, oxyCODONE  Allergies:    Allergies  Allergen Reactions  . Other Other (See Comments)    Pt reports allergic to a medication but unsure of name or type of med  . Cymbalta [Duloxetine Hcl] Other (See Comments)    Dizziness, hallucinations.   .  Keflex [Cephalexin] Other (See Comments)    dizziness  . Simvastatin Other (See Comments)    Joint pain  . Sudafed [Pseudoephedrine] Other (See Comments)    Dizziness    Social History:   Social History   Socioeconomic History  . Marital status: Married    Spouse name: Not on file  . Number of children: 2  . Years of education: Not on file  . Highest education level: High school graduate  Occupational History  . Occupation: Retired  Tobacco Use  . Smoking status: Former Smoker    Quit date: 07/16/1963    Years since quitting: 56.3  . Smokeless tobacco: Former Systems developer    Types: [place] date: 1989  Media planner  . Vaping Use: Never used  Substance and Sexual Activity  . Alcohol use: No  . Drug use: No  . Sexual activity: Not Currently  Other Topics Concern  . Not on file  Social History Narrative   Lives at home with his wife Estill Bamberg   Right handed   No caffeine   Social Determinants of Health   Financial Resource Strain:   . Difficulty of Paying Living Expenses:   Food Insecurity:   . Worried About Charity fundraiser in the Last Year:   . Arboriculturist in the Last Year:   Transportation Needs:   . Film/video editor (Medical):   Marland Kitchen Lack of Transportation (Non-Medical):   Physical Activity:   . Days of Exercise per Week:   . Minutes of Exercise per Session:   Stress:   . Feeling of Stress :   Social Connections:   . Frequency of Communication with Friends and Family:   . Frequency of Social Gatherings with Friends and Family:   . Attends Religious Services:   . Active Member of Clubs or Organizations:   . Attends Archivist Meetings:   Marland Kitchen Marital Status:   Intimate Partner Violence:   . Fear of Current or Ex-Partner:   . Emotionally Abused:   Marland Kitchen Physically Abused:   . Sexually Abused:     Family History:   Family History  Problem Relation Age of Onset  . Heart attack Father        died age 28  . Heart attack Brother        died age 53   . Anuerysm Brother        aortic  . Heart attack Sister        died age 39  . Colon cancer Sister   . Liver cancer Sister   . Diabetes Maternal Grandmother   . Arthritis Mother   . Dementia Mother   . Heart Problems Brother   . Heart Problems Brother   . Heart Problems Brother   . Heart Problems Brother   . Healthy Daughter   . Healthy Son   . Esophageal cancer Neg Hx   . Inflammatory bowel disease Neg Hx   . Pancreatic cancer Neg Hx   . Stomach cancer Neg Hx      ROS:  Unable to obtain completely due to patient's lethargy/encephalopathy  Physical Exam/Data:   Vitals:   11/19/19 0004 11/19/19 0431 11/19/19 0728 11/19/19 1245  BP: 135/68 (!) 145/69 135/65 134/63  Pulse: (!) 108 (!) 102 97 97  Resp: 16 16 16 18   Temp: 98.9 F (37.2 C) 99.3 F (37.4 C) 97.9 F (36.6 C) 99.3 F (37.4 C)  TempSrc: Oral Oral Oral Oral  SpO2: 96% 100% 100% 100%  Weight:      Height:        Intake/Output Summary (Last 24 hours) at 11/19/2019 1450 Last data filed at 11/19/2019 0900 Gross per 24 hour  Intake 120 ml  Output 1650 ml  Net -1530 ml   Last 3 Weights 11/15/2019 10/31/2019 10/03/2019  Weight (lbs) 180 lb 187 lb 195 lb  Weight (kg) 81.647 kg 84.823 kg 88.451 kg     Body mass index is 24.41 kg/m.  General: Well developed, well nourished WM, lethargic but arousable Head: Normocephalic, atraumatic, sclera non-icteric, no xanthomas, nares are without discharge. Neck: Negative for carotid bruits. JVP not elevated. Lungs: Clear bilaterally to auscultation without wheezes, rales, or rhonchi. Breathing is unlabored. Heart: RRR S1 S2 without murmurs, rubs, or gallops.  Abdomen: Soft, non-tender, non-distended with normoactive bowel sounds. No rebound/guarding. Extremities: No clubbing or cyanosis. No  edema. Distal pedal pulses are 2+ and equal bilaterally. Neuro: Lethargic but arousable, knows [facility] and family members as well as name, follows brief commands before falling back  asleep quickly Psych: Lethargic, calm when awakened  EKG:  The EKG was personally reviewed and demonstrates:   NSR 87bpm with occaisonal PVC, Q wave more prominent in III than prior, no acute STT changes  Telemetry:  Telemetry was personally reviewed and demonstrates:  NSR occasional PVC  Relevant CV Studies: 2D echo 01/2018 Left ventricle: The cavity size was normal. There was mild  concentric hypertrophy. Systolic function was normal. The  estimated ejection fraction was in the range of 55% to 60%. Wall  motion was normal; there were no regional wall motion  abnormalities. Doppler parameters are consistent with abnormal  left ventricular relaxation (grade 1 diastolic dysfunction).  Doppler parameters are consistent with high ventricular filling  pressure.  - Aortic valve: Transvalvular velocity was within the normal range.  There was no stenosis. There was no regurgitation.  - Mitral valve: Transvalvular velocity was within the normal range.  There was no evidence for stenosis. There was trivial  regurgitation.  - Left atrium: The atrium was mildly dilated.  - Right ventricle: The cavity size was normal. Wall thickness was  normal. Systolic function was normal.  - Atrial septum: No defect or patent foramen ovale was identified  by color flow Doppler.  - Tricuspid valve: There was mild regurgitation.  - Pulmonary arteries: Systolic pressure was within the normal  range. PA peak pressure: 30 mm Hg (S).  - Global longitudinal strain -16.4% (mildly abnormal).   Laboratory Data:  High Sensitivity Troponin:   Recent Labs  Lab 11/19/19 0908 11/19/19 1141 11/19/19 1303  TROPONINIHS 1,194* 1,252* 1,081*     Chemistry Recent Labs  Lab 11/16/19 0002 11/17/19 0220 11/18/19 0116  NA 142 138 140  K 4.8 4.2 3.7  CL 106 106 107  CO2 28 26 26   GLUCOSE 154* 157* 119*  BUN 21 26* 26*  CREATININE 1.53* 1.53* 1.43*  CALCIUM 8.6* 8.1* 7.9*  GFRNONAA 41*  41* 45*  GFRAA 48* 48* 52*  ANIONGAP 8 6 7     Recent Labs  Lab 11/18/19 1445  PROT 5.8*  ALBUMIN 2.7*  AST 25  ALT 12  ALKPHOS 64  BILITOT 1.0   Hematology Recent Labs  Lab 11/17/19 0220 11/17/19 1040 11/18/19 0116 11/18/19 1445 11/19/19 0443  WBC 13.4*  --  9.6  --  10.4  RBC 2.43*  --  2.40*  --  2.55*  HGB 7.9*   < > 7.5* 8.5* 8.2*  HCT 25.1*   < > 23.9* 27.5* 25.3*  MCV 103.3*  --  99.6  --  99.2  MCH 32.5  --  31.3  --  32.2  MCHC 31.5  --  31.4  --  32.4  RDW 13.6  --  16.6*  --  15.9*  PLT 142*  --  123*  --  174   < > = values in this interval not displayed.   BNPNo results for input(s): BNP, PROBNP in the last 168 hours.  DDimer No results for input(s): DDIMER in the last 168 hours.   Radiology/Studies:  EEG  Result Date: 11/19/2019 Lora Havens, MD     11/19/2019 12:43 PM Patient Name: DONATHAN BULLER MRN: [id number] Epilepsy Attending: Lora Havens Referring Physician/Provider: Dr Irene Pap Date: 11/19/2019 Duration: 24.33 mins Patient history: 86yo M with persistent encephalopathy after closed  right hip fracture post surgical repair on 11/16/2019. EEG to evaluate for seizure. EEG to evaluate for seizure Level of alertness: Awake, asleep AEDs during EEG study: Pregabalin Technical aspects: This EEG study was done with scalp electrodes positioned according to the 10-20 International system of electrode placement. Electrical activity was acquired at a sampling rate of 500Hz  and reviewed with a high frequency filter of 70Hz  and a low frequency filter of 1Hz . EEG data were recorded continuously and digitally stored. Description: The posterior dominant rhythm consists of 8 Hz activity of moderate voltage (25-35 uV) seen predominantly in posterior head regions, symmetric and reactive to eye opening and eye closing. Sleep was characterized by vertex waves, sleep spindles (12 to 14 Hz), maximal frontocentral region. EEG showed intermittent generalized 3 to 6 Hz  theta-delta slowing. Hyperventilation and photic stimulation were not performed.   ABNORMALITY -Intermittent slow, generalized IMPRESSION: This study is suggestive of mild diffuse encephalopathy nonspecific etiology. No seizures or epileptiform discharges were seen throughout the recording. Lora Havens   CT HEAD WO CONTRAST  Result Date: 11/18/2019 CLINICAL DATA:  Encephalopathy EXAM: CT HEAD WITHOUT CONTRAST TECHNIQUE: Contiguous axial images were obtained from the base of the skull through the vertex without intravenous contrast. COMPARISON:  Head CT 09/25/2019, brain MRI 09/11/2019. FINDINGS: Brain: Stable, mild generalized parenchymal atrophy. Redemonstrated chronic small vessel infarcts within the corona radiata/basal ganglia, thalami and cerebellum. Stable background advanced patchy and confluent hypoattenuation within the cerebral white matter which is nonspecific, but consistent with chronic small vessel ischemic disease. To a lesser degree, chronic small vessel ischemic changes are also present within the pons. There is no acute intracranial hemorrhage. No acute demarcated cortical infarct is identified. No extra-axial fluid collection. No evidence of intracranial mass. No midline shift. Vascular: No hyperdense vessel.  Atherosclerotic calcifications. Skull: Normal. Negative for fracture or focal lesion. Sinuses/Orbits: Visualized orbits show no acute finding. Tiny left maxillary sinus mucous retention cyst. Minimal ethmoid sinus mucosal thickening. No significant mastoid effusion. IMPRESSION: No CT evidence of acute intracranial abnormality. Stable appearance of advanced chronic small vessel ischemic disease. Redemonstrated remote small-vessel infarcts within the corona radiata/basal ganglia, thalami and cerebellum. Tiny left maxillary sinus mucous retention cyst. Minimal ethmoid sinus mucosal thickening. Electronically Signed   By: Kellie Simmering DO   On: 11/18/2019 14:36   Pelvis  Portable  Result Date: 11/16/2019 CLINICAL DATA:  Right hip surgery. EXAM: PORTABLE PELVIS 1-2 VIEWS COMPARISON:  Yesterday. FINDINGS: Interval intramedullary rod and screw fixation of the previously demonstrated comminuted right intertrochanteric fracture. Essentially anatomic position and alignment of the major fragments. The lesser trochanter fragment remains medially displaced. IMPRESSION: Hardware fixation of the previously demonstrated comminuted right intertrochanteric fracture. Electronically Signed   By: Claudie Revering M.D.   On: 11/16/2019 14:07   DG CHEST PORT 1 VIEW  Result Date: 11/19/2019 CLINICAL DATA:  Hypoxia, post hip surgery EXAM: PORTABLE CHEST 1 VIEW COMPARISON:  Portable exam 0835 hours compared to 09/11/2019 FINDINGS: Loop recorder projects over LEFT heart. Borderline enlargement of cardiac silhouette post CABG. Mediastinal contours and pulmonary vascularity normal. Mild bibasilar atelectasis. Remaining lungs clear. No pleural effusion or pneumothorax. Bones demineralized. IMPRESSION: Mild bibasilar atelectasis. Electronically Signed   By: Lavonia Dana M.D.   On: 11/19/2019 10:19   DG Swallowing Func-Speech Pathology  Result Date: 11/19/2019 Objective Swallowing Evaluation: Type of Study: MBS-Modified Barium Swallow Study  Patient Details Name: DSHAWN MCNAY MRN: [id number] Date of Birth: [birth year] Today's Date: 11/19/2019 Time: SLP Start Time (ACUTE ONLY):  0958 -SLP Stop Time (ACUTE ONLY): 1016 SLP Time Calculation (min) (ACUTE ONLY): 18 min Past Medical History: Past Medical History: Diagnosis Date . Aneurysm of iliac artery (HCC)  . Colon polyps  . Coronary atherosclerosis of unspecified type of vessel, native or graft  . Diabetes ([place])  . Difficult intubation  . Duodenal ulcer disease 08/15/2018 . Esophageal reflux  . High cholesterol  . History of IBS 02/27/2009 . Hypertension   pt denies, he says he has a h/o hypotension. If BP up he adjusts the Florinef . Jejunitis with partial  SBO 05/20/2017 . Orthostatic hypotension   "BP has been dropping alot when I stand up for the last month or so" (02/17/2016) . Prostatitis  . Upper GI bleed 06/24/2018 . Vitamin B 12 deficiency  . Vitamin D deficiency  Past Surgical History: Past Surgical History: Procedure Laterality Date . BIOPSY  06/25/2018  Procedure: BIOPSY;  Surgeon: Rush Landmark Telford Nab., MD;  Location: [place];  Service: Gastroenterology;; . BIOPSY  04/24/2019  Procedure: BIOPSY;  Surgeon: Irving Copas., MD;  Location: [facility];  Service: Gastroenterology;; . BLADDER SURGERY  1969  traumatic pelvic fractures, urethral and bladder repair . CARDIAC CATHETERIZATION N/A 07/01/2015  Procedure: Left Heart Cath and Coronary Angiography;  Surgeon: Wellington Hampshire, MD;  Location: [place] [facility];  Service: Cardiovascular;  Laterality: N/A; . COLON RESECTION N/A 05/17/2017  Procedure: DIAGNOSTIC LAPAROSCOPY,;  Surgeon: Leighton Ruff, MD;  [facility];  Service: General;  Laterality: N/A; . COLONOSCOPY WITH PROPOFOL N/A 04/24/2019  Procedure: COLONOSCOPY WITH PROPOFOL;  Surgeon: Irving Copas., MD;  [facility];  Service: Gastroenterology;  Laterality: N/A; . CORONARY ANGIOPLASTY WITH STENT PLACEMENT   . CORONARY ARTERY BYPASS GRAFT N/A 07/06/2015  Procedure: CORONARY ARTERY BYPASS GRAFTING (CABG)x 4   utilizing the left internal mammary artery and endoscopically harvested bilateral  sapheneous vein.;  Surgeon: Ivin Poot, MD;  Location: [place];  Service: Open Heart Surgery;  Laterality: N/A; . ESOPHAGEAL DILATION  04/24/2019  Procedure: ESOPHAGEAL DILATION;  Surgeon: Rush Landmark Telford Nab., MD;  [facility];  Service: Gastroenterology;; . ESOPHAGOGASTRODUODENOSCOPY (EGD) WITH PROPOFOL N/A 06/25/2018  Procedure: ESOPHAGOGASTRODUODENOSCOPY (EGD) WITH PROPOFOL;  Surgeon: Irving Copas., MD;  Location: [place];  Service: Gastroenterology;  Laterality: N/A; . ESOPHAGOGASTRODUODENOSCOPY  (EGD) WITH PROPOFOL N/A 04/24/2019  Procedure: ESOPHAGOGASTRODUODENOSCOPY (EGD) WITH PROPOFOL;  Surgeon: Rush Landmark Telford Nab., MD;  [facility];  Service: Gastroenterology;  Laterality: N/A; . FEMUR IM NAIL Right 11/16/2019  Procedure: RIGHT HIP INTRAMEDULLARY (IM) NAIL FEMORAL;  Surgeon: Meredith Pel, MD;  Location: [place];  Service: Orthopedics;  Laterality: Right; . HEMOSTASIS CLIP PLACEMENT  04/24/2019  Procedure: HEMOSTASIS CLIP PLACEMENT;  Surgeon: Irving Copas., MD;  [facility];  Service: Gastroenterology;; . KNEE SURGERY   . LOOP RECORDER INSERTION N/A 02/19/2018  Procedure: LOOP RECORDER INSERTION;  Surgeon: Deboraha Sprang, MD;  Location: [place] [facility];  Service: Cardiovascular;  Laterality: N/A; . POLYPECTOMY  04/24/2019  Procedure: POLYPECTOMY;  Surgeon: Rush Landmark Telford Nab., MD;  [facility];  Service: Gastroenterology;; . Lia Foyer LIFTING INJECTION  04/24/2019  Procedure: SUBMUCOSAL LIFTING INJECTION;  Surgeon: Irving Copas., MD;  [facility] ENDOSCOPY;  Service: Gastroenterology;; . TEE WITHOUT CARDIOVERSION N/A 07/06/2015  Procedure: TRANSESOPHAGEAL ECHOCARDIOGRAM (TEE);  Surgeon: Ivin Poot, MD;  Location: [place];  Service: Open Heart Surgery;  Laterality: N/A; HPI:  86 y.o. male with a history of diet-controlled type 2 diabetes with stage III chronic kidney disease  and diabetic neuropathy, coronary artery disease status post bypass, CVA.  Patient presents with right hip fx after a mechanical fall.  Underwent right hip IM nail placement 7/17. Now with acute metabolic encephalopathy.  [name] present, she reports hx of increasing swallowing difficulty in the last several months, with coughing/choking during meals.  Subjective: lethargic Assessment / Plan / Recommendation [facility] IP CLINICAL IMPRESSIONS 11/19/2019 Clinical Impression Pt has a moderate oropharyngeal dysphagia, also needing Mod cues for arousal throughout session.  His labial seal is weak around a cup, as liquids spill down his chin. He has improved seal with a straw and spoon but still has slow lingual manipulation, taking multiple, pumping efforts to push boluses posteriorly. He takes rest breaks while masticating solids. Bolus cohesion is poor especially with liquids of all consistencies. Swallow initiation is triggered at the pyriform sinuses with thin and nectar thick liquids with silent entrance into the airway before the swallow. Anterior hyoid movement, laryngeal elevation, and laryngeal vestibule closure are also all reduced. Airway protection is not improved with changing bolus delivery or attempts at a chin tuck, but his lethargy also limits his ability to follow postural strategies. SLP provided tactile assist to manipulate a chin down position. Honey thick liquids via straw enter the airway as well but not when administered via spoon. Recommend Dys 3 (mechanical soft) diet and honey thick liquids by cup.  SLP Visit Diagnosis Dysphagia, oropharyngeal phase (R13.12) Attention and concentration deficit following -- Frontal lobe and executive function deficit following -- Impact on safety and function Moderate aspiration risk   [facility] IP TREATMENT RECOMMENDATION 11/19/2019 Treatment Recommendations Therapy as outlined in treatment plan below   Prognosis 11/19/2019 Prognosis for Safe Diet Advancement Good Barriers to Reach Goals Cognitive deficits Barriers/Prognosis Comment -- [facility] IP DIET RECOMMENDATION 11/19/2019 SLP Diet Recommendations Dysphagia 3 (Mech soft) solids;Honey thick liquids Liquid Administration via Cup;No straw Medication Administration Whole meds with puree Compensations Slow rate;Small sips/bites Postural Changes Seated upright at 90 degrees   [facility] IP OTHER RECOMMENDATIONS 11/19/2019 Recommended Consults -- Oral Care Recommendations Oral care BID Other Recommendations Remove water pitcher;Prohibited food (jello, ice cream, thin soups);Order thickener from  pharmacy   [facility] IP FOLLOW UP RECOMMENDATIONS 11/19/2019 Follow up Recommendations Skilled Nursing facility   [facility] IP FREQUENCY AND DURATION 11/19/2019 Speech Therapy Frequency (ACUTE ONLY) min 2x/week Treatment Duration 2 weeks      [facility] IP ORAL PHASE 11/19/2019 Oral Phase Impaired Oral - Pudding Teaspoon -- Oral - Pudding Cup -- Oral - Honey Teaspoon -- Oral - Honey Cup Left anterior bolus loss;Right anterior bolus loss;Weak lingual manipulation;Lingual pumping;Reduced posterior propulsion;Lingual/palatal residue;Delayed oral transit;Decreased bolus cohesion Oral - Nectar Teaspoon -- Oral - Nectar Cup Left anterior bolus loss;Right anterior bolus loss;Weak lingual manipulation;Lingual pumping;Reduced posterior propulsion;Lingual/palatal residue;Delayed oral transit;Decreased bolus cohesion;Premature spillage Oral - Nectar Straw Left anterior bolus loss;Right anterior bolus loss;Weak lingual manipulation;Lingual pumping;Reduced posterior propulsion;Lingual/palatal residue;Delayed oral transit;Decreased bolus cohesion;Premature spillage Oral - Thin Teaspoon -- Oral - Thin Cup Left anterior bolus loss;Right anterior bolus loss;Weak lingual manipulation;Lingual pumping;Reduced posterior propulsion;Lingual/palatal residue;Delayed oral transit;Decreased bolus cohesion;Premature spillage Oral - Thin Straw Left anterior bolus loss;Right anterior bolus loss;Weak lingual manipulation;Lingual pumping;Reduced posterior propulsion;Lingual/palatal residue;Delayed oral transit;Decreased bolus cohesion;Premature spillage Oral - Puree Weak lingual manipulation;Lingual pumping;Reduced posterior propulsion;Lingual/palatal residue;Delayed oral transit;Decreased bolus cohesion Oral - Mech Soft Weak lingual manipulation;Lingual pumping;Reduced posterior propulsion;Lingual/palatal residue;Delayed oral transit;Decreased bolus cohesion;Impaired mastication Oral - Regular -- Oral - Multi-Consistency -- Oral - Pill -- Oral Phase - Comment --  [facility]  IP PHARYNGEAL PHASE 11/19/2019 Pharyngeal Phase Impaired Pharyngeal- Pudding Teaspoon -- Pharyngeal -- Pharyngeal- Pudding Cup -- Pharyngeal -- Pharyngeal- Honey Teaspoon -- Pharyngeal -- Pharyngeal- Honey Cup Reduced anterior laryngeal mobility;Reduced laryngeal elevation;Reduced airway/laryngeal closure;Delayed swallow initiation-vallecula Pharyngeal -- Pharyngeal- Nectar Teaspoon -- Pharyngeal -- Pharyngeal- Nectar Cup Reduced anterior laryngeal mobility;Reduced laryngeal elevation;Reduced airway/laryngeal closure;Delayed swallow initiation-pyriform sinuses;Penetration/Aspiration before swallow Pharyngeal Material enters airway, passes BELOW cords without attempt by patient to eject out (silent aspiration) Pharyngeal- Nectar Straw Reduced anterior laryngeal mobility;Reduced laryngeal elevation;Reduced airway/laryngeal closure;Delayed swallow initiation-pyriform sinuses;Penetration/Aspiration before swallow Pharyngeal Material enters airway, passes BELOW cords without attempt by patient to eject out (silent aspiration) Pharyngeal- Thin Teaspoon -- Pharyngeal -- Pharyngeal- Thin Cup Reduced anterior laryngeal mobility;Reduced laryngeal elevation;Reduced airway/laryngeal closure;Penetration/Aspiration before swallow;Delayed swallow initiation-pyriform sinuses Pharyngeal Material enters airway, passes BELOW cords without attempt by patient to eject out (silent aspiration) Pharyngeal- Thin Straw Reduced anterior laryngeal mobility;Reduced laryngeal elevation;Reduced airway/laryngeal closure;Delayed swallow initiation-pyriform sinuses;Penetration/Aspiration before swallow Pharyngeal Material enters airway, passes BELOW cords without attempt by patient to eject out (silent aspiration) Pharyngeal- Puree Reduced anterior laryngeal mobility;Reduced laryngeal elevation;Reduced airway/laryngeal closure;Delayed swallow initiation-vallecula Pharyngeal -- Pharyngeal- Mechanical Soft Reduced anterior laryngeal mobility;Reduced  laryngeal elevation;Reduced airway/laryngeal closure;Delayed swallow initiation-vallecula Pharyngeal -- Pharyngeal- Regular -- Pharyngeal -- Pharyngeal- Multi-consistency -- Pharyngeal -- Pharyngeal- Pill -- Pharyngeal -- Pharyngeal Comment --  No flowsheet data found. Osie Bond., M.A. CCC-SLP Acute Rehabilitation Services Pager [phone] Office [phone] 11/19/2019, 11:53 AM              DG C-Arm 1-60 Min  Result Date: 11/16/2019 CLINICAL DATA:  Right hip fracture. EXAM: DG C-ARM 1-60 MIN; DG HIP (WITH OR WITHOUT PELVIS) 2-3V RIGHT COMPARISON:  11/15/2019 FINDINGS: Eighteen C-arm views of the right hip and femur demonstrate intramedullary rod and compression screw fixation of the previously demonstrated comminuted right intertrochanteric fracture. Anatomic position and alignment on the later images. IMPRESSION: Hardware fixation of the previously demonstrated right intertrochanteric fracture. Electronically Signed   By: Claudie Revering M.D.   On: 11/16/2019 14:06   CUP PACEART REMOTE DEVICE CHECK  Result Date: 11/18/2019 [facility] summary report received. Battery status OK. Normal device function. No new symptom episodes, tachy episodes, brady, or pause episodes. No new AF episodes. Monthly summary reports and ROV/PRN  DG HIP UNILAT WITH PELVIS 2-3 VIEWS RIGHT  Result Date: 11/16/2019 CLINICAL DATA:  Right hip fracture. EXAM: DG C-ARM 1-60 MIN; DG HIP (WITH OR WITHOUT PELVIS) 2-3V RIGHT COMPARISON:  11/15/2019 FINDINGS: Eighteen C-arm views of the right hip and femur demonstrate intramedullary rod and compression screw fixation of the previously demonstrated comminuted right intertrochanteric fracture. Anatomic position and alignment on the later images. IMPRESSION: Hardware fixation of the previously demonstrated right intertrochanteric fracture. Electronically Signed   By: Claudie Revering M.D.   On: 11/16/2019 14:06   DG Hip Unilat With Pelvis 2-3 Views Right  Result Date: 11/15/2019 CLINICAL DATA:   Fall, right hip pain EXAM: DG HIP (WITH OR WITHOUT PELVIS) 2-3V RIGHT COMPARISON:  None. FINDINGS: Comminuted intertrochanteric right hip fracture seen with mild Verus angulation and avulsion of the lesser trochanter. Femoral head is still seated within the right acetabulum. Limited evaluation of the left hip is unremarkable. The pelvis and sacrum appear intact. IMPRESSION: Comminuted intertrochanteric right hip fracture. Electronically Signed   By: Fidela Salisbury MD   On: 11/15/2019 15:35       TIMI Risk Score for Unstable Angina or Non-ST Elevation MI:   The patient's TIMI risk score is 5, which indicates  a 26% risk of all cause mortality, new or recurrent myocardial infarction or need for urgent revascularization in the next 14 days.    Assessment and Plan:   1. Mechanical fall with hip fracture s/p repair - wife reports this occurred during a side-shuffling gait, no syncope. - per ortho/IM  2. Persistent encephalopathy post-operatively - source not clear, does not appear to have gotten any specific sedating medications other than recent anesthesia from the OR.  - agree with brain MRI - consider holding Lyrica - may benefit from neurology consultation  3. Elevated troponin - higher than traditionally expected with demand ischemia, but no clear features otherwise of ACS. EKG is unrevealing for acute abnormality. The elevated troponin in and of itself would not be specifically driving his continued encephalopathy, but certainly portends increased risk of morbidity/mortality. - await 2D echocardiogram  - continue ASA - historically not on any medications that would lower blood pressure, including beta blocker - continue pravastatin (LDL 59 today) and consider titration to more potent agent when clinical picture more clear - will review further with MD  4. H/o orthostatic hypotension - BPs stable. - continue florinef  5. H/o strokes and TIA - on ASA 162mg  per neurology as outpatient. IM  has lowered to 81mg  while on concomitant Xarelto for DVT ppx. - most recent ILR interrogation through 11/15/19 have not shown any atrial fibrillation - once he completes Xarelto course would likely need to go back to 162mg  of ASA unless otherwise contraindicated  6. CKD stage III - creatinine appears at baseline. - continue to monitor  7. Post-op anemia - s/p 1 U PRBC, most recent Hgb 8.2. - f/u labs in AM   For questions or updates, please contact [place] Please consult [email or web address] for contact info under    Signed, Charlie Pitter, PA-C  11/19/2019 2:50 PM  Patient seen and examined with Melina Copa PA-C.  Agree as above, with the following exceptions and changes as noted below. Patient was initially absent from room, but returned from brain MRI while I was speaking with his family (wife and daughter). Gen: somnolent, CV: RRR, no murmurs, Lungs: clear, Abd: soft, Extrem: Warm, well perfused, no edema, Neuro/Psych: somnolent, unable to assess. All available labs, radiology testing, previous records reviewed. Echo is unchanged from prior with preserved EF and no description of RWMA. Brain MR demonstrates acute areas of stroke. In the setting of normal echo, this likely is the source of abnormal troponin. His echocardiogram does not show a source of stroke. Neurology recommends loop recorder, patient has a loop recorder with no findings of afib on last interrogation. Could consider TEE if indicated by neurology.   Elouise Munroe, MD 11/19/19 9:37 PM

## 2019-11-19 NOTE — Telephone Encounter (Signed)
I called and spoke with wife.  Patient fx hip over weekend and had IM nail with Dr. Marlou Sa.  Patient also had stroke.  Not able to wake patient up currently.  TKA is on hold.

## 2019-11-19 NOTE — Progress Notes (Signed)
Modified Barium Swallow Progress Note  Patient Details  Name: [name] MRN: [id number] Date of Birth: 11-Jul-1933  Today's Date: 11/19/2019  Modified Barium Swallow completed.  Full report located under Chart Review in the Imaging Section.  Brief recommendations include the following:  Clinical Impression  Pt has a moderate oropharyngeal dysphagia, also needing Mod cues for arousal throughout session. His labial seal is weak around a cup, as liquids spill down his chin. He has improved seal with a straw and spoon but still has slow lingual manipulation, taking multiple, pumping efforts to push boluses posteriorly. He takes rest breaks while masticating solids. Bolus cohesion is poor especially with liquids of all consistencies. Swallow initiation is triggered at the pyriform sinuses with thin and nectar thick liquids with silent entrance into the airway before the swallow. Anterior hyoid movement, laryngeal elevation, and laryngeal vestibule closure are also all reduced. Airway protection is not improved with changing bolus delivery or attempts at a chin tuck, but his lethargy also limits his ability to follow postural strategies. SLP provided tactile assist to manipulate a chin down position. Honey thick liquids via straw enter the airway as well but not when administered via spoon. Recommend Dys 3 (mechanical soft) diet and honey thick liquids by cup.    Swallow Evaluation Recommendations       SLP Diet Recommendations: Dysphagia 3 (Mech soft) solids;Honey thick liquids   Liquid Administration via: Cup;No straw   Medication Administration: Whole meds with puree   Supervision: Staff to assist with self feeding;Full supervision/cueing for compensatory strategies   Compensations: Slow rate;Small sips/bites   Postural Changes: Seated upright at 90 degrees   Oral Care Recommendations: Oral care BID   Other Recommendations: Remove water pitcher;Prohibited food (jello, ice cream,  thin soups);Order thickener from pharmacy    Osie Bond., M.A. [place] Pager [phone] Office [phone]  11/19/2019,11:48 AM

## 2019-11-20 ENCOUNTER — Other Ambulatory Visit (HOSPITAL_COMMUNITY): Payer: Medicare Other

## 2019-11-20 DIAGNOSIS — I6349 Cerebral infarction due to embolism of other cerebral artery: Secondary | ICD-10-CM

## 2019-11-20 DIAGNOSIS — I6389 Other cerebral infarction: Secondary | ICD-10-CM | POA: Diagnosis not present

## 2019-11-20 DIAGNOSIS — E1122 Type 2 diabetes mellitus with diabetic chronic kidney disease: Secondary | ICD-10-CM | POA: Diagnosis not present

## 2019-11-20 DIAGNOSIS — I639 Cerebral infarction, unspecified: Secondary | ICD-10-CM

## 2019-11-20 DIAGNOSIS — S72001A Fracture of unspecified part of neck of right femur, initial encounter for closed fracture: Secondary | ICD-10-CM

## 2019-11-20 DIAGNOSIS — E1342 Other specified diabetes mellitus with diabetic polyneuropathy: Secondary | ICD-10-CM

## 2019-11-20 DIAGNOSIS — S72001G Fracture of unspecified part of neck of right femur, subsequent encounter for closed fracture with delayed healing: Secondary | ICD-10-CM | POA: Diagnosis not present

## 2019-11-20 DIAGNOSIS — I739 Peripheral vascular disease, unspecified: Secondary | ICD-10-CM

## 2019-11-20 DIAGNOSIS — K219 Gastro-esophageal reflux disease without esophagitis: Secondary | ICD-10-CM

## 2019-11-20 DIAGNOSIS — I25810 Atherosclerosis of coronary artery bypass graft(s) without angina pectoris: Secondary | ICD-10-CM | POA: Diagnosis not present

## 2019-11-20 DIAGNOSIS — E119 Type 2 diabetes mellitus without complications: Secondary | ICD-10-CM | POA: Diagnosis not present

## 2019-11-20 LAB — GLUCOSE, CAPILLARY
Glucose-Capillary: 128 mg/dL — ABNORMAL HIGH (ref 70–99)
Glucose-Capillary: 131 mg/dL — ABNORMAL HIGH (ref 70–99)
Glucose-Capillary: 146 mg/dL — ABNORMAL HIGH (ref 70–99)
Glucose-Capillary: 122 mg/dL — ABNORMAL HIGH (ref 70–99)
Glucose-Capillary: 127 mg/dL — ABNORMAL HIGH (ref 70–99)

## 2019-11-20 LAB — AMMONIA: Ammonia: 20 umol/L (ref 9–35)

## 2019-11-20 MED ORDER — POLYETHYLENE GLYCOL 3350 17 G PO PACK: 17.0000 g | PACK | Freq: Every day | ORAL | Status: DC | PRN

## 2019-11-20 MED ORDER — SENNOSIDES-DOCUSATE SODIUM 8.6-50 MG PO TABS: 2.0000 | ORAL_TABLET | Freq: Every evening | ORAL | Status: DC | PRN

## 2019-11-20 MED ORDER — ACETAMINOPHEN 325 MG PO TABS
650.0000 mg | ORAL_TABLET | Freq: Four times a day (QID) | ORAL | Status: DC | PRN
  Administered 2019-11-20 – 2019-11-26 (×7): 650 mg via ORAL
  Filled 2019-11-20 (×8): qty 2

## 2019-11-20 NOTE — Progress Notes (Signed)
Inpatient Rehabilitation-Admissions Coordinator   Met with pt and his wife bedside to discuss CIR consult recommendations. Please see formal consult note from PM&R MD Dr. Letta Pate for details. Pt is not a candidate for IP Rehab as it is felt he will not be able to tolerate the intensity of our rehab program. Pt sound asleep during my meeting with his wife. She verbalized an understanding of current recommendation for SNF. I have contacted TOC team and will sign off.   Please call if questions.   Raechel Ache, OTR/L  Rehab Admissions Coordinator  [phone] 11/20/2019 1:41 PM

## 2019-11-20 NOTE — Progress Notes (Signed)
PROGRESS NOTE    [name]  JFH:[id number] DOB: 1933/12/27 DOA: 11/15/2019 PCP: Dennis Pinto, MD   Brief Narrative:  86 year old with history of essential hypertension, CAD status post CABG 2017, CVA, TIA, AAA, GI bleed, DM2, IBS, HLD, carotid stenosis, CKD stage III OA admitted for mechanical fall with right hip fracture requiring surgical repair on 11/16/2019 started on postop DVT prophylaxis Xarelto.  Hospital course complicated by postop anemia requiring 1 unit PRBC transfusion.  Also developed metabolic encephalopathy, CT head negative but MRI brain showed acute CVA, neurology consulted.  EEG was overall unremarkable.  Had elevated troponin therefore cardiology team consulted as well.   Assessment & Plan:   Principal Problem:   Closed right hip fracture (HCC) Active Problems:   GERD   Supine hypertension   PVD (peripheral vascular disease) (HCC)   Coronary artery disease involving coronary bypass graft of native heart without angina pectoris   Diabetes mellitus type 2, diet-controlled ([place])   CKD stage 3 due to type 2 diabetes mellitus (HCC)   Dysautonomia orthostatic hypotension syndrome (HCC)   Diabetic neuropathy (HCC)   History of lacunar cerebrovascular accident (CVA)   Elevated troponin   Acute encephalopathy Acute medial left parietal lobe infarct, multifocal -Lyrica reduced to 100 mg daily -CT head negative, MRI brain consistent with acute CVA-multifocal, unchanged bilateral papilledema, chronic microvascular ischemic changes in multiple areas of the brain, moderate cerebral atrophy -EEG-unremarkable, ammonia levels-unremarkable -MRA -Carotid Dopplers -Echocardiogram-EF 60 to 65%, moderate LVH, grade 2 DD -A1c-6.1 -LDL-59 -TSH-within normal limits -Check ammonia levels  Elevated troponin -Likely demand ischemia.  Cardiology following -Echo-EF 93-73%, grade 2 diastolic dysfunction.  Closed right hip fracture status post surgical repair 7/21 -Postop  management per orthopedic.  Status post right hip IM nail.  Eventually will need to go to SNF.  Acute blood loss anemia postop -Status post PRBC transfusion.  Hemoglobin stable.  Xarelto for DVT prophylaxis restarting  Autonomic dysfunction -Daily Florinef  Dysphagia -Speech team following.  Aspiration precautions. -MBS   CKD stage IIIA -creatinine around baseline of 1.4  History of CVA Currently on daily aspirin and Lipitor 80 mg daily.  Further recommendations by neurology team  Coronary artery disease status post CABG Currently chest pain-free.  Ambulatory dysfunction post right hip repair PT/OT recommending SNF    DVT prophylaxis: Daily Xarelto 10 mg Code Status: DNR Family Communication: Wife at bedside  Status is: Inpatient  Remains inpatient appropriate because:Inpatient level of care appropriate due to severity of illness   Dispo:  Patient From: Home  Planned Disposition: [facility]  Expected discharge date: 11/22/19  Medically stable for discharge: No.  Patient still needs acute CVA evaluation and neurology clearance prior to discharge.   Body mass index is 27.93 kg/m.       Subjective: Patient is sleepy during my evaluation wife is at bedside.  He is able to answer very basic questions and denies any complaints  Review of Systems Otherwise negative except as per HPI, including: General: Denies fever, chills, night sweats or unintended weight loss. Resp: Denies cough, wheezing, shortness of breath. Cardiac: Denies chest pain, palpitations, orthopnea, paroxysmal nocturnal dyspnea. GI: Denies abdominal pain, nausea, vomiting, diarrhea or constipation GU: Denies dysuria, frequency, hesitancy or incontinence MS: Denies muscle aches, joint pain or swelling Neuro: Denies headache, neurologic deficits (focal weakness, numbness, tingling), abnormal gait Psych: Denies anxiety, depression, SI/HI/AVH Skin: Denies new rashes or  lesions ID: Denies sick contacts, exotic exposures, travel  Examination:  Constitutional: Not in  acute distress, drowsy Respiratory: Clear to auscultation bilaterally Cardiovascular: Normal sinus rhythm, no rubs Abdomen: Nontender nondistended good bowel sounds Musculoskeletal: No edema noted Skin: No rashes seen Neurologic: Difficult to assess as he does not participate much in conversation. Psychiatric: Drowsy difficult to assess  Objective: Vitals:   11/20/19 0026 11/20/19 0402 11/20/19 0412 11/20/19 0734  BP: 130/73  123/65 127/69  Pulse: 99  97 92  Resp: 19  17 15   Temp: 98.7 F (37.1 C)  98.4 F (36.9 C) 98.1 F (36.7 C)  TempSrc:   Oral Oral  SpO2: 97%  97% 96%  Weight:  93.4 kg    Height:        Intake/Output Summary (Last 24 hours) at 11/20/2019 0740 Last data filed at 11/19/2019 2300 Gross per 24 hour  Intake 3840 ml  Output 500 ml  Net 3340 ml   Filed Weights   11/15/19 1405 11/20/19 0402  Weight: 81.6 kg 93.4 kg     Data Reviewed:   CBC: Recent Labs  Lab 11/15/19 1542 11/15/19 1542 11/16/19 0002 11/16/19 0002 11/17/19 0220 11/17/19 1040 11/18/19 0116 11/18/19 1445 11/19/19 0443  WBC 6.9  --  13.7*  --  13.4*  --  9.6  --  10.4  NEUTROABS 4.5  --   --   --   --   --   --   --   --   HGB 11.8*   < > 11.8*   < > 7.9* 7.0* 7.5* 8.5* 8.2*  HCT 37.3*   < > 38.2*   < > 25.1* 22.7* 23.9* 27.5* 25.3*  MCV 102.8*  --  102.4*  --  103.3*  --  99.6  --  99.2  PLT 172  --  181  --  142*  --  123*  --  174   < > = values in this interval not displayed.   Basic Metabolic Panel: Recent Labs  Lab 11/15/19 1542 11/16/19 0002 11/17/19 0220 11/18/19 0116  NA 142 142 138 140  K 3.9 4.8 4.2 3.7  CL 106 106 106 107  CO2 28 28 26 26   GLUCOSE 153* 154* 157* 119*  BUN 23 21 26* 26*  CREATININE 1.44* 1.53* 1.53* 1.43*  CALCIUM 8.6* 8.6* 8.1* 7.9*   GFR: Estimated Creatinine Clearance: 45.6 mL/min (A) (by C-G formula based on SCr of 1.43 mg/dL  (H)). Liver Function Tests: Recent Labs  Lab 11/18/19 1445  AST 25  ALT 12  ALKPHOS 64  BILITOT 1.0  PROT 5.8*  ALBUMIN 2.7*   No results for input(s): LIPASE, AMYLASE in the last 168 hours. Recent Labs  Lab 11/18/19 1445  AMMONIA 14   Coagulation Profile: Recent Labs  Lab 11/15/19 1542  INR 1.1   Cardiac Enzymes: No results for input(s): CKTOTAL, CKMB, CKMBINDEX, TROPONINI in the last 168 hours. BNP (last 3 results) No results for input(s): PROBNP in the last 8760 hours. HbA1C: Recent Labs    11/19/19 0908  HGBA1C 5.1   CBG: Recent Labs  Lab 11/19/19 0824 11/19/19 1201 11/19/19 1610 11/19/19 2137 11/20/19 0632  GLUCAP 146* 147* 124* 124* 128*   Lipid Profile: Recent Labs    11/19/19 1141  CHOL 101  HDL 26*  LDLCALC 59  TRIG 82  CHOLHDL 3.9   Thyroid Function Tests: Recent Labs    11/19/19 0908  TSH 1.468   Anemia Panel: No results for input(s): VITAMINB12, FOLATE, FERRITIN, TIBC, IRON, RETICCTPCT in the last 72 hours. Sepsis Labs:  Recent Labs  Lab 11/19/19 0908  PROCALCITON <0.10  LATICACIDVEN 0.9    Recent Results (from the past 240 hour(s))  SARS Coronavirus 2 by RT PCR (hospital order, performed in [facility] hospital lab) Nasopharyngeal Nasopharyngeal Swab     Status: None   Collection Time: 11/15/19  3:30 PM   Specimen: Nasopharyngeal Swab  Result Value Ref Range Status   SARS Coronavirus 2 NEGATIVE NEGATIVE Final    Comment: (NOTE) SARS-CoV-2 target nucleic acids are NOT DETECTED.  The SARS-CoV-2 RNA is generally detectable in upper and lower respiratory specimens during the acute phase of infection. The lowest concentration of SARS-CoV-2 viral copies this assay can detect is 250 copies / mL. A negative result does not preclude SARS-CoV-2 infection and should not be used as the sole basis for treatment or other patient management decisions.  A negative result may occur with improper specimen collection / handling, submission  of specimen other than nasopharyngeal swab, presence of viral mutation(s) within the areas targeted by this assay, and inadequate number of viral copies (<250 copies / mL). A negative result must be combined with clinical observations, patient history, and epidemiological information.  Fact Sheet for Patients:   StrictlyIdeas.no  Fact Sheet for Healthcare Providers: BankingDealers.co.za  This test is not yet approved or  cleared by the [place] FDA and has been authorized for detection and/or diagnosis of SARS-CoV-2 by FDA under an Emergency Use Authorization (EUA).  This EUA will remain in effect (meaning this test can be used) for the duration of the COVID-19 declaration under Section 564(b)(1) of the Act, 21 U.S.C. section 360bbb-3(b)(1), unless the authorization is terminated or revoked sooner.  Performed at [facility], [address] [place], [place] [address]   MRSA PCR Screening     Status: None   Collection Time: 11/16/19  1:03 AM   Specimen: Nasal Mucosa; Nasopharyngeal  Result Value Ref Range Status   MRSA by PCR NEGATIVE NEGATIVE Final    Comment:        The GeneXpert MRSA Assay (FDA approved for NASAL specimens only), is one component of a comprehensive MRSA colonization surveillance program. It is not intended to diagnose MRSA infection nor to guide or monitor treatment for MRSA infections. Performed at [facility], [place] [address], [place], [place] [address]          Radiology Studies: EEG  Result Date: 11/19/2019 Lora Havens, MD     11/19/2019 12:43 PM Patient Name: [name] MRN: [id number] Epilepsy Attending: Lora Havens Referring Physician/Provider: Dr Irene Pap Date: 11/19/2019 Duration: 24.33 mins Patient history: 86yo M with persistent encephalopathy after closed right hip fracture post surgical repair on 11/16/2019. EEG to evaluate for seizure. EEG to evaluate for seizure  Level of alertness: Awake, asleep AEDs during EEG study: Pregabalin Technical aspects: This EEG study was done with scalp electrodes positioned according to the 10-20 International system of electrode placement. Electrical activity was acquired at a sampling rate of 500Hz  and reviewed with a high frequency filter of 70Hz  and a low frequency filter of 1Hz . EEG data were recorded continuously and digitally stored. Description: The posterior dominant rhythm consists of 8 Hz activity of moderate voltage (25-35 uV) seen predominantly in posterior head regions, symmetric and reactive to eye opening and eye closing. Sleep was characterized by vertex waves, sleep spindles (12 to 14 Hz), maximal frontocentral region. EEG showed intermittent generalized 3 to 6 Hz theta-delta slowing. Hyperventilation and photic stimulation were not performed.  ABNORMALITY -Intermittent slow, generalized IMPRESSION: This study is suggestive of mild diffuse encephalopathy nonspecific etiology. No seizures or epileptiform discharges were seen throughout the recording. Lora Havens   CT HEAD WO CONTRAST  Result Date: 11/18/2019 CLINICAL DATA:  Encephalopathy EXAM: CT HEAD WITHOUT CONTRAST TECHNIQUE: Contiguous axial images were obtained from the base of the skull through the vertex without intravenous contrast. COMPARISON:  Head CT 09/25/2019, brain MRI 09/11/2019. FINDINGS: Brain: Stable, mild generalized parenchymal atrophy. Redemonstrated chronic small vessel infarcts within the corona radiata/basal ganglia, thalami and cerebellum. Stable background advanced patchy and confluent hypoattenuation within the cerebral white matter which is nonspecific, but consistent with chronic small vessel ischemic disease. To a lesser degree, chronic small vessel ischemic changes are also present within the pons. There is no acute intracranial hemorrhage. No acute demarcated cortical infarct is identified. No extra-axial fluid collection. No evidence  of intracranial mass. No midline shift. Vascular: No hyperdense vessel.  Atherosclerotic calcifications. Skull: Normal. Negative for fracture or focal lesion. Sinuses/Orbits: Visualized orbits show no acute finding. Tiny left maxillary sinus mucous retention cyst. Minimal ethmoid sinus mucosal thickening. No significant mastoid effusion. IMPRESSION: No CT evidence of acute intracranial abnormality. Stable appearance of advanced chronic small vessel ischemic disease. Redemonstrated remote small-vessel infarcts within the corona radiata/basal ganglia, thalami and cerebellum. Tiny left maxillary sinus mucous retention cyst. Minimal ethmoid sinus mucosal thickening. Electronically Signed   By: Kellie Simmering DO   On: 11/18/2019 14:36   MR BRAIN WO CONTRAST  Result Date: 11/19/2019 CLINICAL DATA:  Not alert for 3 days. EXAM: MRI HEAD WITHOUT CONTRAST TECHNIQUE: Multiplanar, multiecho pulse sequences of the brain and surrounding structures were obtained without intravenous contrast. COMPARISON:  11/18/2019 head CT.  09/11/2019 MRI head. FINDINGS: Brain: Moderate cerebral atrophy with ex vacuo dilatation. Advanced chronic microvascular ischemic changes predominantly involving the periventricular and subcortical white matter. Sequela of remote lacunar infarcts involving the corona radiata, basal ganglia, thalami and cerebellum. There are foci of restricted diffusion with correlative T2/FLAIR hyperintense signal involving the right cerebellum. Scattered punctate foci of restricted diffusion are seen within the medial left parietal lobe (3:31, 32). No intracranial hemorrhage. No midline shift or extra-axial fluid collection. No mass lesion. Vascular: The major arterial flow voids are grossly unremarkable. Skull and upper cervical spine: Normal marrow signal. Sinuses/Orbits: Bilateral optic nerve sheath distension (6:11), unchanged since 2019. Bilateral lens replacement. Mild ethmoid sinus mucosal thickening. No mastoid  effusion. Other: None. IMPRESSION: Multifocal acute right cerebellar infarcts. Punctate acute medial left parietal lobe infarct. Advanced chronic microvascular ischemic changes and sequela of remote infarcts involving the corona radiata, basal ganglia, thalami and cerebellum. Moderate cerebral atrophy. Unchanged bilateral papilledema. These results were called by telephone at the time of interpretation on 11/19/2019 at 4:03 pm to provider [facility] , who verbally acknowledged these results. Electronically Signed   By: Primitivo Gauze M.D.   On: 11/19/2019 16:09   DG CHEST PORT 1 VIEW  Result Date: 11/19/2019 CLINICAL DATA:  Hypoxia, post hip surgery EXAM: PORTABLE CHEST 1 VIEW COMPARISON:  Portable exam 0835 hours compared to 09/11/2019 FINDINGS: Loop recorder projects over LEFT heart. Borderline enlargement of cardiac silhouette post CABG. Mediastinal contours and pulmonary vascularity normal. Mild bibasilar atelectasis. Remaining lungs clear. No pleural effusion or pneumothorax. Bones demineralized. IMPRESSION: Mild bibasilar atelectasis. Electronically Signed   By: Lavonia Dana M.D.   On: 11/19/2019 10:19   DG Swallowing Func-Speech Pathology  Result Date: 11/19/2019 Objective Swallowing Evaluation: Type of Study: MBS-Modified Barium Swallow Study  Patient Details Name: KAVAUGHN FAUCETT MRN: [id number] Date of Birth: 10/17/1933 Today's Date: 11/19/2019 Time: SLP Start Time (ACUTE ONLY): 7867 -SLP Stop Time (ACUTE ONLY): 1016 SLP Time Calculation (min) (ACUTE ONLY): 18 min Past Medical History: Past Medical History: Diagnosis Date . Aneurysm of iliac artery (HCC)  . Colon polyps  . Coronary atherosclerosis of unspecified type of vessel, native or graft  . Diabetes ([place])  . Difficult intubation  . Duodenal ulcer disease 08/15/2018 . Esophageal reflux  . High cholesterol  . History of IBS 02/27/2009 . Hypertension   pt denies, he says he has a h/o hypotension. If BP up he adjusts the Florinef . Jejunitis  with partial SBO 05/20/2017 . Orthostatic hypotension   "BP has been dropping alot when I stand up for the last month or so" (02/17/2016) . Prostatitis  . Upper GI bleed 06/24/2018 . Vitamin B 12 deficiency  . Vitamin D deficiency  Past Surgical History: Past Surgical History: Procedure Laterality Date . BIOPSY  06/25/2018  Procedure: BIOPSY;  Surgeon: Rush Landmark Telford Nab., MD;  Location: [facility];  Service: Gastroenterology;; . BIOPSY  04/24/2019  Procedure: BIOPSY;  Surgeon: Irving Copas., MD;  Location: [facility];  Service: Gastroenterology;; . BLADDER SURGERY  1969  traumatic pelvic fractures, urethral and bladder repair . CARDIAC CATHETERIZATION N/A 07/01/2015  Procedure: Left Heart Cath and Coronary Angiography;  Surgeon: Wellington Hampshire, MD;  Location: [facility];  Service: Cardiovascular;  Laterality: N/A; . COLON RESECTION N/A 05/17/2017  Procedure: DIAGNOSTIC LAPAROSCOPY,;  Surgeon: Leighton Ruff, MD;  Location: [facility];  Service: General;  Laterality: N/A; . COLONOSCOPY WITH PROPOFOL N/A 04/24/2019  Procedure: COLONOSCOPY WITH PROPOFOL;  Surgeon: Irving Copas., MD;  Location: [facility];  Service: Gastroenterology;  Laterality: N/A; . CORONARY ANGIOPLASTY WITH STENT PLACEMENT   . CORONARY ARTERY BYPASS GRAFT N/A 07/06/2015  Procedure: CORONARY ARTERY BYPASS GRAFTING (CABG)x 4   utilizing the left internal mammary artery and endoscopically harvested bilateral  sapheneous vein.;  Surgeon: Ivin Poot, MD;  Location: [facility];  Service: Open Heart Surgery;  Laterality: N/A; . ESOPHAGEAL DILATION  04/24/2019  Procedure: ESOPHAGEAL DILATION;  Surgeon: Rush Landmark Telford Nab., MD;  Location: [facility];  Service: Gastroenterology;; . ESOPHAGOGASTRODUODENOSCOPY (EGD) WITH PROPOFOL N/A 06/25/2018  Procedure: ESOPHAGOGASTRODUODENOSCOPY (EGD) WITH PROPOFOL;  Surgeon: Irving Copas., MD;  Location: [facility];  Service: Gastroenterology;  Laterality: N/A; .  ESOPHAGOGASTRODUODENOSCOPY (EGD) WITH PROPOFOL N/A 04/24/2019  Procedure: ESOPHAGOGASTRODUODENOSCOPY (EGD) WITH PROPOFOL;  Surgeon: Rush Landmark Telford Nab., MD;  Location: [facility];  Service: Gastroenterology;  Laterality: N/A; . FEMUR IM NAIL Right 11/16/2019  Procedure: RIGHT HIP INTRAMEDULLARY (IM) NAIL FEMORAL;  Surgeon: Meredith Pel, MD;  Location: [facility];  Service: Orthopedics;  Laterality: Right; . HEMOSTASIS CLIP PLACEMENT  04/24/2019  Procedure: HEMOSTASIS CLIP PLACEMENT;  Surgeon: Irving Copas., MD;  Location: [facility];  Service: Gastroenterology;; . KNEE SURGERY   . LOOP RECORDER INSERTION N/A 02/19/2018  Procedure: LOOP RECORDER INSERTION;  Surgeon: Deboraha Sprang, MD;  Location: [facility];  Service: Cardiovascular;  Laterality: N/A; . POLYPECTOMY  04/24/2019  Procedure: POLYPECTOMY;  Surgeon: Rush Landmark Telford Nab., MD;  Location: [facility];  Service: Gastroenterology;; . Lia Foyer LIFTING INJECTION  04/24/2019  Procedure: SUBMUCOSAL LIFTING INJECTION;  Surgeon: Irving Copas., MD;  Location: [facility];  Service: Gastroenterology;; . TEE WITHOUT CARDIOVERSION N/A 07/06/2015  Procedure: TRANSESOPHAGEAL ECHOCARDIOGRAM (TEE);  Surgeon: Ivin Poot, MD;  Location: [facility];  Service: Open Heart Surgery;  Laterality: N/A; HPI:  86 y.o. male with a history of diet-controlled type 2 diabetes with stage III chronic kidney disease and diabetic neuropathy, coronary artery disease status post bypass, CVA.  Patient presents with right hip fx after a mechanical fall.  Underwent right hip IM nail placement 7/17. Now with acute metabolic encephalopathy.  [name] present, she reports hx of increasing swallowing difficulty in the last several months, with coughing/choking during meals.  Subjective: lethargic Assessment / Plan / Recommendation CHL IP CLINICAL IMPRESSIONS 11/19/2019 Clinical Impression Pt has a moderate oropharyngeal dysphagia, also needing Mod cues for  arousal throughout session. His labial seal is weak around a cup, as liquids spill down his chin. He has improved seal with a straw and spoon but still has slow lingual manipulation, taking multiple, pumping efforts to push boluses posteriorly. He takes rest breaks while masticating solids. Bolus cohesion is poor especially with liquids of all consistencies. Swallow initiation is triggered at the pyriform sinuses with thin and nectar thick liquids with silent entrance into the airway before the swallow. Anterior hyoid movement, laryngeal elevation, and laryngeal vestibule closure are also all reduced. Airway protection is not improved with changing bolus delivery or attempts at a chin tuck, but his lethargy also limits his ability to follow postural strategies. SLP provided tactile assist to manipulate a chin down position. Honey thick liquids via straw enter the airway as well but not when administered via spoon. Recommend Dys 3 (mechanical soft) diet and honey thick liquids by cup.  SLP Visit Diagnosis Dysphagia, oropharyngeal phase (R13.12) Attention and concentration deficit following -- Frontal lobe and executive function deficit following -- Impact on safety and function Moderate aspiration risk   CHL IP TREATMENT RECOMMENDATION 11/19/2019 Treatment Recommendations Therapy as outlined in treatment plan below   Prognosis 11/19/2019 Prognosis for Safe Diet Advancement Good Barriers to Reach Goals Cognitive deficits Barriers/Prognosis Comment -- CH[facility] IP DIET RECOMMENDATION 11/19/2019 SLP Diet Recommendations Dysphagia 3 (Mech soft) solids;Honey thick liquids Liquid Administration via Cup;No straw Medication Administration Whole meds with puree Compensations Slow rate;Small sips/bites Postural Changes Seated upright at 90 degrees   CHL IP OTHER RECOMMENDATIONS 11/19/2019 Recommended Consults -- Oral Care Recommendations Oral care BID Other Recommendations Remove water pitcher;Prohibited food (jello, ice cream, thin  soups);Order thickener from pharmacy   CH[facility] IP FOLLOW UP RECOMMENDATIONS 11/19/2019 Follow up Recommendations Skilled Nursing facility   [facility] IP FREQUENCY AND DURATION 11/19/2019 Speech Therapy Frequency (ACUTE ONLY) min 2x/week Treatment Duration 2 weeks      CHL IP ORAL PHASE 11/19/2019 Oral Phase Impaired Oral - Pudding Teaspoon -- Oral - Pudding Cup -- Oral - Honey Teaspoon -- Oral - Honey Cup Left anterior bolus loss;Right anterior bolus loss;Weak lingual manipulation;Lingual pumping;Reduced posterior propulsion;Lingual/palatal residue;Delayed oral transit;Decreased bolus cohesion Oral - Nectar Teaspoon -- Oral - Nectar Cup Left anterior bolus loss;Right anterior bolus loss;Weak lingual manipulation;Lingual pumping;Reduced posterior propulsion;Lingual/palatal residue;Delayed oral transit;Decreased bolus cohesion;Premature spillage Oral - Nectar Straw Left anterior bolus loss;Right anterior bolus loss;Weak lingual manipulation;Lingual pumping;Reduced posterior propulsion;Lingual/palatal residue;Delayed oral transit;Decreased bolus cohesion;Premature spillage Oral - Thin Teaspoon -- Oral - Thin Cup Left anterior bolus loss;Right anterior bolus loss;Weak lingual manipulation;Lingual pumping;Reduced posterior propulsion;Lingual/palatal residue;Delayed oral transit;Decreased bolus cohesion;Premature spillage Oral - Thin Straw Left anterior bolus loss;Right anterior bolus loss;Weak lingual manipulation;Lingual pumping;Reduced posterior propulsion;Lingual/palatal residue;Delayed oral transit;Decreased bolus cohesion;Premature spillage Oral - Puree Weak lingual manipulation;Lingual pumping;Reduced posterior propulsion;Lingual/palatal residue;Delayed oral transit;Decreased bolus cohesion Oral - Mech Soft Weak lingual manipulation;Lingual pumping;Reduced posterior propulsion;Lingual/palatal residue;Delayed oral transit;Decreased bolus  cohesion;Impaired mastication Oral - Regular -- Oral - Multi-Consistency -- Oral - Pill --  Oral Phase - Comment --  CHL IP PHARYNGEAL PHASE 11/19/2019 Pharyngeal Phase Impaired Pharyngeal- Pudding Teaspoon -- Pharyngeal -- Pharyngeal- Pudding Cup -- Pharyngeal -- Pharyngeal- Honey Teaspoon -- Pharyngeal -- Pharyngeal- Honey Cup Reduced anterior laryngeal mobility;Reduced laryngeal elevation;Reduced airway/laryngeal closure;Delayed swallow initiation-vallecula Pharyngeal -- Pharyngeal- Nectar Teaspoon -- Pharyngeal -- Pharyngeal- Nectar Cup Reduced anterior laryngeal mobility;Reduced laryngeal elevation;Reduced airway/laryngeal closure;Delayed swallow initiation-pyriform sinuses;Penetration/Aspiration before swallow Pharyngeal Material enters airway, passes BELOW cords without attempt by patient to eject out (silent aspiration) Pharyngeal- Nectar Straw Reduced anterior laryngeal mobility;Reduced laryngeal elevation;Reduced airway/laryngeal closure;Delayed swallow initiation-pyriform sinuses;Penetration/Aspiration before swallow Pharyngeal Material enters airway, passes BELOW cords without attempt by patient to eject out (silent aspiration) Pharyngeal- Thin Teaspoon -- Pharyngeal -- Pharyngeal- Thin Cup Reduced anterior laryngeal mobility;Reduced laryngeal elevation;Reduced airway/laryngeal closure;Penetration/Aspiration before swallow;Delayed swallow initiation-pyriform sinuses Pharyngeal Material enters airway, passes BELOW cords without attempt by patient to eject out (silent aspiration) Pharyngeal- Thin Straw Reduced anterior laryngeal mobility;Reduced laryngeal elevation;Reduced airway/laryngeal closure;Delayed swallow initiation-pyriform sinuses;Penetration/Aspiration before swallow Pharyngeal Material enters airway, passes BELOW cords without attempt by patient to eject out (silent aspiration) Pharyngeal- Puree Reduced anterior laryngeal mobility;Reduced laryngeal elevation;Reduced airway/laryngeal closure;Delayed swallow initiation-vallecula Pharyngeal -- Pharyngeal- Mechanical Soft Reduced anterior  laryngeal mobility;Reduced laryngeal elevation;Reduced airway/laryngeal closure;Delayed swallow initiation-vallecula Pharyngeal -- Pharyngeal- Regular -- Pharyngeal -- Pharyngeal- Multi-consistency -- Pharyngeal -- Pharyngeal- Pill -- Pharyngeal -- Pharyngeal Comment --  No flowsheet data found. Osie Bond., M.A. CCC-SLP Acute Rehabilitation Services Pager [phone] Office [phone] 11/19/2019, 11:53 AM              ECHOCARDIOGRAM COMPLETE  Result Date: 11/19/2019    ECHOCARDIOGRAM REPORT   Patient Name:   [name] Date of Exam: 11/19/2019 Medical Rec #:  [id number]         Height:       72.0 in Accession #:    [id number]        Weight:       180.0 lb Date of Birth:  Jun 13, 1933         BSA:          2.037 m Patient Age:    62 years          BP:           134/63 mmHg Patient Gender: M                 HR:           90 bpm. Exam Location:  Inpatient Procedure: 2D Echo, Cardiac Doppler and Color Doppler Indications:    Elevated Troponin  History:        Patient has prior history of Echocardiogram examinations, most                 recent 02/01/2018. Risk Factors:Hypertension, Diabetes,                 Dyslipidemia and GERD.  Sonographer:    Jonelle Sidle Dance Referring Phys: [id number] [place]  1. Left ventricular ejection fraction, by estimation, is 60 to 65%. The left ventricle has normal function. The left ventricle has no regional wall motion abnormalities. There is moderate concentric left ventricular hypertrophy. Left ventricular diastolic parameters are consistent with Grade II diastolic dysfunction (pseudonormalization). Elevated left atrial pressure.  2. Right ventricular systolic function is normal. The right ventricular size is normal. There is normal pulmonary artery systolic pressure. The estimated right ventricular  systolic pressure is 54.6 mmHg.  3. Left atrial size was mildly dilated.  4. The mitral valve is degenerative. No evidence of mitral valve regurgitation. No evidence of  mitral stenosis.  5. The aortic valve is tricuspid. Aortic valve regurgitation is not visualized. No aortic stenosis is present.  6. The inferior vena cava is normal in size with greater than 50% respiratory variability, suggesting right atrial pressure of 3 mmHg. Comparison(s): No significant change from prior study. FINDINGS  Left Ventricle: Left ventricular ejection fraction, by estimation, is 60 to 65%. The left ventricle has normal function. The left ventricle has no regional wall motion abnormalities. The left ventricular internal cavity size was normal in size. There is  moderate concentric left ventricular hypertrophy. Left ventricular diastolic parameters are consistent with Grade II diastolic dysfunction (pseudonormalization). Elevated left atrial pressure. Right Ventricle: The right ventricular size is normal. No increase in right ventricular wall thickness. Right ventricular systolic function is normal. There is normal pulmonary artery systolic pressure. The tricuspid regurgitant velocity is 2.50 m/s, and  with an assumed right atrial pressure of 3 mmHg, the estimated right ventricular systolic pressure is 27.0 mmHg. Left Atrium: Left atrial size was mildly dilated. Right Atrium: Right atrial size was normal in size. Pericardium: Trivial pericardial effusion is present. Presence of pericardial fat pad. Mitral Valve: The mitral valve is degenerative in appearance. There is mild calcification of the posterior and anterior mitral valve leaflet(s). Mild mitral annular calcification. No evidence of mitral valve regurgitation. No evidence of mitral valve stenosis. Tricuspid Valve: The tricuspid valve is grossly normal. Tricuspid valve regurgitation is mild . No evidence of tricuspid stenosis. Aortic Valve: The aortic valve is tricuspid. Aortic valve regurgitation is not visualized. No aortic stenosis is present. Pulmonic Valve: The pulmonic valve was grossly normal. Pulmonic valve regurgitation is mild. No  evidence of pulmonic stenosis. Aorta: The aortic root and ascending aorta are structurally normal, with no evidence of dilitation. Venous: The inferior vena cava is normal in size with greater than 50% respiratory variability, suggesting right atrial pressure of 3 mmHg. IAS/Shunts: The atrial septum is grossly normal.  LEFT VENTRICLE PLAX 2D LVIDd:         4.50 cm  Diastology LVIDs:         3.70 cm  LV e' lateral:   7.29 cm/s LV PW:         1.70 cm  LV E/e' lateral: 13.6 LV IVS:        1.50 cm  LV e' medial:    4.68 cm/s LVOT diam:     2.00 cm  LV E/e' medial:  21.1 LV SV:         72 LV SV Index:   35 LVOT Area:     3.14 cm  RIGHT VENTRICLE             IVC RV Basal diam:  2.70 cm     IVC diam: 1.70 cm RV S prime:     10.10 cm/s TAPSE (M-mode): 1.5 cm LEFT ATRIUM             Index       RIGHT ATRIUM           Index LA diam:        4.70 cm 2.31 cm/m  RA Area:     16.40 cm LA Vol (A2C):   68.1 ml 33.43 ml/m RA Volume:   37.90 ml  18.60 ml/m LA Vol (A4C):   79.3 ml  38.92 ml/m LA Biplane Vol: 76.7 ml 37.65 ml/m  AORTIC VALVE LVOT Vmax:   120.00 cm/s LVOT Vmean:  73.350 cm/s LVOT VTI:    0.228 m  AORTA Ao Root diam: 3.70 cm Ao Asc diam:  3.30 cm MITRAL VALVE               TRICUSPID VALVE MV Area (PHT): 3.42 cm    TR Peak grad:   25.0 mmHg MV Decel Time: 222 msec    TR Vmax:        250.00 cm/s MV E velocity: 98.80 cm/s MV A velocity: 96.70 cm/s  SHUNTS MV E/A ratio:  1.02        Systemic VTI:  0.23 m                            Systemic Diam: 2.00 cm Eleonore Chiquito MD Electronically signed by Eleonore Chiquito MD Signature Date/Time: 11/19/2019/3:38:06 PM    Final         Scheduled Meds: . aspirin EC  81 mg Oral Daily  . atorvastatin  80 mg Oral Daily  . docusate sodium  100 mg Oral BID  . fludrocortisone  0.1 mg Oral Daily  . insulin aspart  0-9 Units Subcutaneous Q4H  . pantoprazole  40 mg Oral Daily  . polyethylene glycol  17 g Oral Daily  . pregabalin  100 mg Oral Daily  . rivaroxaban  10 mg Oral Q  supper   Continuous Infusions: . sodium chloride 50 mL/hr at 11/19/19 2300     LOS: 5 days   Time spent= 35 mins    Preslea Rhodus Arsenio Loader, MD [facility]  If 7PM-7AM, please contact night-coverage  11/20/2019, 7:40 AM

## 2019-11-20 NOTE — Progress Notes (Signed)
Carelink Summary Report / Loop Recorder 

## 2019-11-20 NOTE — TOC Progression Note (Signed)
Transition of Care [facility]) - Progression Note    Patient Details  Name: [name] MRN: [id number] Date of Birth: 1934/03/30  Transition of Care [facility]) CM/SW [place], [place] Phone Number: 11/20/2019, 4:06 PM  Clinical Narrative:     CSW spoke with patient's wife at bedside(patient was sleep). CSW introduced self and explained role. Patient wife states although disappointed patient could not discharge to inpatient rehab she understands and was agreeable to short term rehab at [facility]. Shwe reports patient has never been to a SNF. CSW explained the SNF process. Patient wife states no preferred SNF. Patient  has received covid vaccines.  CSW provide spouse with bed offers- she will review and inform CSW.  CSW started insurance authorization  CSW will continue to follow and assist with discharge planning.  Thurmond Butts, MSW, LCSWA Clinical Social Worker     Barriers to Discharge: Continued Medical Work up  Expected Discharge Plan and [place] arrangements for the past 2 months: Single Family Home                                       Social Determinants of Health (SDOH) Interventions    Readmission Risk Interventions No flowsheet data found.

## 2019-11-20 NOTE — Care Management Important Message (Signed)
Important Message  Patient Details  Name: JADIER ROCKERS MRN: [id number] Date of Birth: 02-20-1934   Medicare Important Message Given:  Yes     Shelda Altes 11/20/2019, 11:03 AM

## 2019-11-20 NOTE — TOC Progression Note (Signed)
Transition of Care [facility]) - Progression Note    Patient Details  Name: [name] MRN: [id number] Date of Birth: January 28, 1934  Transition of Care [facility]) CM/SW Contact  Zenon Mayo, RN Phone Number: 11/20/2019, 1:19 PM  Clinical Narrative:    NCM was informed by [facility] rep that patient is unable to tolerate and they will be signing off and Recommending SNF, this NCM informed [place].     Barriers to Discharge: Continued Medical Work up  Expected Discharge Plan and Services         Living arrangements for the past 2 months: Single Family Home                                       Social Determinants of Health (SDOH) Interventions    Readmission Risk Interventions No flowsheet data found.

## 2019-11-20 NOTE — Telephone Encounter (Addendum)
Spoke with wife who stated her husband has been in hospital since Friday. Dr Leonie Man has ordered tests on him today. I informed her that carotid ultrasound shows mild 40 to 59% narrowing of both carotid arteries in the neck. This is expectedly worse than previous carotid ultrasound 4 years ago but not surprising. No need for any surgery or stenting at this time but medical management needs to continue and yearly follow-up carotid ultrasound studies are recommended. Advised that transcranial Doppler study is not optimal due to poor bony windows but do not suggest any major blockages or worrisome findings. Wife verbalized understanding, appreciation.

## 2019-11-20 NOTE — Progress Notes (Signed)
SLP Cancellation Note  Patient Details Name: [name] MRN: [id number] DOB: [birth year]   Cancelled treatment:       Reason Eval/Treat Not Completed: Patient at procedure or test/unavailable (Pt being seen by MD at this time. SLP will follow up. )  [name] [name], [place], [place] Office number [phone] Pager [place] 11/20/2019, 3:32 PM

## 2019-11-20 NOTE — Progress Notes (Signed)
OT Cancellation Note  Patient Details Name: [name] MRN: [id number] DOB: 02/26/1934   Cancelled Treatment:    Reason Eval/Treat Not Completed: Fatigue/lethargy limiting ability to participate. Patient met lying supine in bed with wife present at bedside. Pt. very lethargic with inability to keep eyes open. Unable to participate with skilled OT services at this time. OT will continue efforts when patient is able to participate.    H. OTR/L Supplemental OT, Department of rehab services [phone]   R H.  11/20/2019, 1:48 PM 

## 2019-11-20 NOTE — Progress Notes (Signed)
STROKE TEAM PROGRESS NOTE   INTERVAL HISTORY His wife is at the bedside. She said his confusion is worse today.  I personally reviewed history of presenting illness, electronic medical records and imaging films in PACS.  MRI scan of the brain shows tiny bilateral cerebellar and left parietal infarcts.  LDL cholesterol is 59 mg percent.  Hemoglobin A1c is 5.1.  Echocardiogram is unremarkable  Vitals:   11/20/19 0026 11/20/19 0402 11/20/19 0412 11/20/19 0734  BP: 130/73  123/65 127/69  Pulse: 99  97 92  Resp: 19  17 15   Temp: 98.7 F (37.1 C)  98.4 F (36.9 C) 98.1 F (36.7 C)  TempSrc:   Oral Oral  SpO2: 97%  97% 96%  Weight:  93.4 kg    Height:       CBC:  Recent Labs  Lab 11/15/19 1542 11/16/19 0002 11/18/19 0116 11/18/19 0116 11/18/19 1445 11/19/19 0443  WBC 6.9   < > 9.6  --   --  10.4  NEUTROABS 4.5  --   --   --   --   --   HGB 11.8*   < > 7.5*   < > 8.5* 8.2*  HCT 37.3*   < > 23.9*   < > 27.5* 25.3*  MCV 102.8*   < > 99.6  --   --  99.2  PLT 172   < > 123*  --   --  174   < > = values in this interval not displayed.   Basic Metabolic Panel:  Recent Labs  Lab 11/17/19 0220 11/18/19 0116  NA 138 140  K 4.2 3.7  CL 106 107  CO2 26 26  GLUCOSE 157* 119*  BUN 26* 26*  CREATININE 1.53* 1.43*  CALCIUM 8.1* 7.9*   Lipid Panel:  Recent Labs  Lab 11/19/19 1141  CHOL 101  TRIG 82  HDL 26*  CHOLHDL 3.9  VLDL 16  LDLCALC 59   HgbA1c:  Recent Labs  Lab 11/19/19 0908  HGBA1C 5.1   Urine Drug Screen: No results for input(s): LABOPIA, COCAINSCRNUR, LABBENZ, AMPHETMU, THCU, LABBARB in the last 168 hours.  Alcohol Level No results for input(s): ETH in the last 168 hours.  IMAGING past 24 hours EEG  Result Date: 11/19/2019 Dennis Havens, MD     11/19/2019 12:43 PM Patient Name: [name] MRN: [id number] Epilepsy Attending: Lora Frank Referring Physician/Provider: Dr Irene Frank Date: 11/19/2019 Duration: 24.33 mins Patient history: 86yo M with  persistent encephalopathy after closed right hip fracture post surgical repair on 11/16/2019. EEG to evaluate for seizure. EEG to evaluate for seizure Level of alertness: Awake, asleep AEDs during EEG study: Preg[place]alin Technical aspects: This EEG study was done with scalp electrodes positioned according to the 10-20 International system of electrode placement. Electrical activity was acquired at a sampling rate of 500Hz  and reviewed with a high frequency filter of 70Hz  and a low frequency filter of 1Hz . EEG data were recorded continuously and digitally stored. Description: The posterior dominant rhythm consists of 8 Hz activity of moderate voltage (25-35 uV) seen predominantly in posterior head regions, symmetric and reactive to eye opening and eye closing. Sleep was characterized by vertex waves, sleep spindles (12 to 14 Hz), maximal frontocentral region. EEG showed intermittent generalized 3 to 6 Hz theta-delta slowing. Hyperventilation and photic stimulation were not performed.   ABNORMALITY -Intermittent slow, generalized IMPRESSION: This study is suggestive of mild diffuse encephalopathy nonspecific etiology. No seizures or epileptiform discharges were  seen throughout the recording. [name]   MR BRAIN WO CONTRAST  Result Date: 11/19/2019 CLINICAL DATA:  Not alert for 3 days. EXAM: MRI HEAD WITHOUT CONTRAST TECHNIQUE: Multiplanar, multiecho pulse sequences of the brain and surrounding structures were obtained without intravenous contrast. COMPARISON:  11/18/2019 head CT.  09/11/2019 MRI head. FINDINGS: Brain: Moderate cerebral atrophy with ex vacuo dilatation. Advanced chronic microvascular ischemic changes predominantly involving the periventricular and subcortical white matter. Sequela of remote lacunar infarcts involving the corona radiata, basal ganglia, thalami and cerebellum. There are foci of restricted diffusion with correlative T2/FLAIR hyperintense signal involving the right cerebellum.  Scattered punctate foci of restricted diffusion are seen within the medial left parietal lobe (3:31, 32). No intracranial hemorrhage. No midline shift or extra-axial fluid collection. No mass lesion. Vascular: The major arterial flow voids are grossly unremarkable. Skull and upper cervical spine: Normal marrow signal. Sinuses/Orbits: Bilateral optic nerve sheath distension (6:11), unchanged since 2019. Bilateral lens replacement. Mild ethmoid sinus mucosal thickening. No mastoid effusion. Other: None. IMPRESSION: Multifocal acute right cerebellar infarcts. Punctate acute medial left parietal lobe infarct. Advanced chronic microvascular ischemic changes and sequela of remote infarcts involving the corona radiata, basal ganglia, thalami and cerebellum. Moderate cerebral atrophy. Unchanged bilateral papilledema. These results were called by telephone at the time of interpretation on 11/19/2019 at 4:03 pm to provider [name] , who verbally acknowledged these results. Electronically Signed   By: [name] M.D.   On: 11/19/2019 16:09   ECHOCARDIOGRAM COMPLETE  Result Date: 11/19/2019    ECHOCARDIOGRAM REPORT   Patient Name:   [name] Date of Exam: 11/19/2019 Medical Rec #:  [id number]         Height:       72.0 in Accession #:    [id number]        Weight:       180.0 lb Date of Birth:  25-Aug-1933         BSA:          2.037 m Patient Age:    86 years          BP:           134/63 mmHg Patient Gender: M                 HR:           90 bpm. Exam Location:  Inpatient Procedure: 2D Echo, Cardiac Doppler and Color Doppler Indications:    Elevated Troponin  History:        Patient has prior history of Echocardiogram examinations, most                 recent 02/01/2018. Risk Factors:Hypertension, Diabetes,                 Dyslipidemia and GERD.  Sonographer:    Jonelle Sidle Dance Referring Phys: [id number] [place]  1. Left ventricular ejection fraction, by estimation, is 60 to 65%. The left  ventricle has normal function. The left ventricle has no regional wall motion abnormalities. There is moderate concentric left ventricular hypertrophy. Left ventricular diastolic parameters are consistent with Grade II diastolic dysfunction (pseudonormalization). Elevated left atrial pressure.  2. Right ventricular systolic function is normal. The right ventricular size is normal. There is normal pulmonary artery systolic pressure. The estimated right ventricular systolic pressure is 12.1 mmHg.  3. Left atrial size was mildly dilated.  4. The mitral valve is degenerative. No evidence of mitral valve  regurgitation. No evidence of mitral stenosis.  5. The aortic valve is tricuspid. Aortic valve regurgitation is not visualized. No aortic stenosis is present.  6. The inferior vena cava is normal in size with greater than 50% respiratory variability, suggesting right atrial pressure of 3 mmHg. Comparison(s): No significant change from prior study. FINDINGS  Left Ventricle: Left ventricular ejection fraction, by estimation, is 60 to 65%. The left ventricle has normal function. The left ventricle has no regional wall motion abnormalities. The left ventricular internal cavity size was normal in size. There is  moderate concentric left ventricular hypertrophy. Left ventricular diastolic parameters are consistent with Grade II diastolic dysfunction (pseudonormalization). Elevated left atrial pressure. Right Ventricle: The right ventricular size is normal. No increase in right ventricular wall thickness. Right ventricular systolic function is normal. There is normal pulmonary artery systolic pressure. The tricuspid regurgitant velocity is 2.50 m/s, and  with an assumed right atrial pressure of 3 mmHg, the estimated right ventricular systolic pressure is 51.7 mmHg. Left Atrium: Left atrial size was mildly dilated. Right Atrium: Right atrial size was normal in size. Pericardium: Trivial pericardial effusion is present. Presence  of pericardial fat pad. Mitral Valve: The mitral valve is degenerative in appearance. There is mild calcification of the posterior and anterior mitral valve leaflet(s). Mild mitral annular calcification. No evidence of mitral valve regurgitation. No evidence of mitral valve stenosis. Tricuspid Valve: The tricuspid valve is grossly normal. Tricuspid valve regurgitation is mild . No evidence of tricuspid stenosis. Aortic Valve: The aortic valve is tricuspid. Aortic valve regurgitation is not visualized. No aortic stenosis is present. Pulmonic Valve: The pulmonic valve was grossly normal. Pulmonic valve regurgitation is mild. No evidence of pulmonic stenosis. Aorta: The aortic root and ascending aorta are structurally normal, with no evidence of dilitation. Venous: The inferior vena cava is normal in size with greater than 50% respiratory variability, suggesting right atrial pressure of 3 mmHg. IAS/Shunts: The atrial septum is grossly normal.  LEFT VENTRICLE PLAX 2D LVIDd:         4.50 cm  Diastology LVIDs:         3.70 cm  LV e' lateral:   7.29 cm/s LV PW:         1.70 cm  LV E/e' lateral: 13.6 LV IVS:        1.50 cm  LV e' medial:    4.68 cm/s LVOT diam:     2.00 cm  LV E/e' medial:  21.1 LV SV:         72 LV SV Index:   35 LVOT Area:     3.14 cm  RIGHT VENTRICLE             IVC RV Basal diam:  2.70 cm     IVC diam: 1.70 cm RV S prime:     10.10 cm/s TAPSE (M-mode): 1.5 cm LEFT ATRIUM             Index       RIGHT ATRIUM           Index LA diam:        4.70 cm 2.31 cm/m  RA Area:     16.40 cm LA Vol (A2C):   68.1 ml 33.43 ml/m RA Volume:   37.90 ml  18.60 ml/m LA Vol (A4C):   79.3 ml 38.92 ml/m LA Biplane Vol: 76.7 ml 37.65 ml/m  AORTIC VALVE LVOT Vmax:   120.00 cm/s LVOT Vmean:  73.350 cm/s LVOT VTI:  0.228 m  AORTA Ao Root diam: 3.70 cm Ao Asc diam:  3.30 cm MITRAL VALVE               TRICUSPID VALVE MV Area (PHT): 3.42 cm    TR Peak grad:   25.0 mmHg MV Decel Time: 222 msec    TR Vmax:        250.00 cm/s  MV E velocity: 98.80 cm/s MV A velocity: 96.70 cm/s  SHUNTS MV E/A ratio:  1.02        Systemic VTI:  0.23 m                            Systemic Diam: 2.00 cm Eleonore Chiquito MD Electronically signed by Eleonore Chiquito MD Signature Date/Time: 11/19/2019/3:38:06 PM    Final     PHYSICAL EXAM Pleasant elderly Caucasian male not in distress. . Afebrile. Head is nontraumatic. Neck is supple without bruit.    Cardiac exam no murmur or gallop. Lungs are clear to auscultation. Distal pulses are well felt.  Intermittent right upper extremity tremor.  Bilateral asterixis left hand greater than right Neurological Exam : He is drowsy, he can be aroused but goes back to sleep.  Follows only simple midline commands and few one-step commands.  Answers his name and speaks short sentences only.  Can tell me his wife's name but not his children's names.  Speech is clear.  No dysarthria or aphasia.  Extraocular movements full range without nystagmus.  Blinks to threat bilaterally.  Mild left facial droop.  Tongue midline motor system exam moves both upper extremity against gravity but will not cooperate for detailed testing.  Both lower extremity are weak less movements on the right due to hip pain.  Sensation appears intact bilaterally.  Toes are downgoing.  Reflexes are symmetric.  Gait not tested. ASSESSMENT/PLAN [name] is a 86 y.o. male with history of of upper GI bleed, TIA, stroke, hypertension, diabetes, carotid disease and CAD presenting to ED following a mechanical fall w/ R hip fx s/p repair. On Xarelto. Post OR drowsy, lethargic w/ confusion. MRI showed multiple R cerebellar infarcts.     Toxic metabolic encephalopathy   Baseline dementia in post op pt  On Lyrica PTA, stopped.   Other Sedation meds stopped except for pain med. Try to avoid as much as possible.  EEG intermittent slowing  Anticipate improvement over next several days  Stroke:   Multiple R cerebellar and single small L parietal  lobe infarcts embolic secondary to unknown etiology-possibly fat embolism from hip surgery. Stroke is not felt to be the etiology of his lethargy which appears out of proportion to the small strokes and less likely from encephalopathy.  CT head 7/19 No acute abnormality. Small vessel disease. Old corona radiata / basal ganglia, thalamic and cerebellar lacunes. Sinus dz.   MRI  Acute R cerebellar infarcts. Punctate medial L parietal infarct. Small vessel disease.  Old corona radiata, basal ganglia, thalamic and cerebellar lacunes. Atrophy. Stable B papilledema.   Carotid Doppler  June 2021 B 40-59% stenosis (worse than previous exams)   2D Echo EF 60-65%. No source of embolus. LA dilation   TCD bubble pending    Loop, last interrogation 7/18 neg   LDL 59  HgbA1c 5.1  VTE prophylaxis - Xarelto  aspirin 81 mg daily prior to admission, now on aspirin 81 mg daily and Xarelto (rivaroxaban) daily.   Therapy recommendations:  pending   Disposition:  pending   Hypertension  Stable . Permissive hypertension (OK if < 220/120) but gradually normalize in 5-7 days . Long-term BP goal normotensive  Hyperlipidemia  Home meds:  Fish oil, pravachol 40  Now on lipitor 80  LDL 59, goal < 70  Has been intolerant to simvastatin in the past  Consider decreasing statin dose given advanced age, LDL < 94, and known simvastatin intolerance  Continue statin at discharge  Diabetes type II Controlled  HgbA1c 5.1, goal < 7.0  Other Stroke Risk Factors  Advanced age  Former Cigarette smoker, quit 25 yrs ago  Smokeless tobacco user - chew  Overweight, Body mass index is 27.93 kg/m., recommend weight loss, diet and exercise as appropriate   Hx stroke/TIA  08/2019 - transient altered mental stuats w/ LUE weakness, likley r subcortical TIA from small vessel disease.  02/23/2018 new acute stroke in the left splenium. OP workup  01/24/2018 - R temporal lobe infarct. Chronic B basal ganglia  and cerebellar infarcts. OP workup  Remote hronic lacunar infarcts involving right basal ganglia, cerebellum and corona radiata  Coronary artery disease s/p CABG  PVD  Other Active Problems  Baseline memory difficulty, followed by Dennis. Leonie Man as an [facility] day # 5 He presented with a mechanical fall leading to hip fracture and postop has developed altered mental status and drowsiness which appears to be out of proportion to the tiny infarct seen on the MRI and likely represent superimposed encephalopathy etiology not clear.  Recommend check EEG for seizure activity and whole CNS active agents like Lyrica, pain meds or sleep meds.  Continue Xarelto and aspirin.  Long discussion with the patient and wife at the bedside and answered questions.  Discussed with Dennis. Reesa Chew.  Greater than 50% time during this 35-minute visit was spent on counseling and coordination of care about his strokes and altered mental status and answering questions Antony Contras, MD To contact Stroke Continuity provider, please refer to [email or web address] After hours, contact General Neurology

## 2019-11-20 NOTE — Progress Notes (Signed)
PT Cancellation Note  Patient Details Name: [name] MRN: [id number] DOB: 1933/12/27   Cancelled Treatment:    Reason Eval/Treat Not Completed: Fatigue/lethargy limiting ability to participate.  Pt was too lethargic to keep awake for therapy.  Will try again as time and pt allow.   Ramond Dial 11/20/2019, 4:40 PM   Mee Hives, PT MS Acute Rehab Dept. Number: [place] [facility] [place]

## 2019-11-21 ENCOUNTER — Inpatient Hospital Stay (HOSPITAL_COMMUNITY): Payer: Medicare Other

## 2019-11-21 ENCOUNTER — Telehealth: Payer: Self-pay | Admitting: Orthopedic Surgery

## 2019-11-21 DIAGNOSIS — G903 Multi-system degeneration of the autonomic nervous system: Secondary | ICD-10-CM

## 2019-11-21 DIAGNOSIS — I25810 Atherosclerosis of coronary artery bypass graft(s) without angina pectoris: Secondary | ICD-10-CM | POA: Diagnosis not present

## 2019-11-21 DIAGNOSIS — Z8673 Personal history of transient ischemic attack (TIA), and cerebral infarction without residual deficits: Secondary | ICD-10-CM | POA: Diagnosis not present

## 2019-11-21 DIAGNOSIS — E1342 Other specified diabetes mellitus with diabetic polyneuropathy: Secondary | ICD-10-CM | POA: Diagnosis not present

## 2019-11-21 DIAGNOSIS — E119 Type 2 diabetes mellitus without complications: Secondary | ICD-10-CM | POA: Diagnosis not present

## 2019-11-21 DIAGNOSIS — N3000 Acute cystitis without hematuria: Secondary | ICD-10-CM

## 2019-11-21 DIAGNOSIS — I6310 Cerebral infarction due to embolism of unspecified precerebral artery: Secondary | ICD-10-CM

## 2019-11-21 DIAGNOSIS — S72001G Fracture of unspecified part of neck of right femur, subsequent encounter for closed fracture with delayed healing: Secondary | ICD-10-CM | POA: Diagnosis not present

## 2019-11-21 DIAGNOSIS — R5081 Fever presenting with conditions classified elsewhere: Secondary | ICD-10-CM

## 2019-11-21 LAB — GLUCOSE, CAPILLARY
Glucose-Capillary: 107 mg/dL — ABNORMAL HIGH (ref 70–99)
Glucose-Capillary: 123 mg/dL — ABNORMAL HIGH (ref 70–99)
Glucose-Capillary: 131 mg/dL — ABNORMAL HIGH (ref 70–99)
Glucose-Capillary: 134 mg/dL — ABNORMAL HIGH (ref 70–99)
Glucose-Capillary: 140 mg/dL — ABNORMAL HIGH (ref 70–99)
Glucose-Capillary: 168 mg/dL — ABNORMAL HIGH (ref 70–99)

## 2019-11-21 LAB — URINALYSIS, ROUTINE W REFLEX MICROSCOPIC
Bilirubin Urine: NEGATIVE
Glucose, UA: NEGATIVE mg/dL
Ketones, ur: NEGATIVE mg/dL
Leukocytes,Ua: NEGATIVE
Nitrite: POSITIVE — AB
Protein, ur: 30 mg/dL — AB
Specific Gravity, Urine: 1.019 (ref 1.005–1.030)
pH: 6 (ref 5.0–8.0)

## 2019-11-21 LAB — BASIC METABOLIC PANEL
Anion gap: 9 (ref 5–15)
BUN: 25 mg/dL — ABNORMAL HIGH (ref 8–23)
CO2: 26 mmol/L (ref 22–32)
Calcium: 8 mg/dL — ABNORMAL LOW (ref 8.9–10.3)
Chloride: 108 mmol/L (ref 98–111)
Creatinine, Ser: 1.3 mg/dL — ABNORMAL HIGH (ref 0.61–1.24)
GFR calc Af Amer: 58 mL/min — ABNORMAL LOW (ref >60)
GFR calc non Af Amer: 50 mL/min — ABNORMAL LOW (ref >60)
Glucose, Bld: 123 mg/dL — ABNORMAL HIGH (ref 70–99)
Potassium: 3.3 mmol/L — ABNORMAL LOW (ref 3.5–5.1)
Sodium: 143 mmol/L (ref 135–145)

## 2019-11-21 LAB — CBC
HCT: 24.8 % — ABNORMAL LOW (ref 39.0–52.0)
Hemoglobin: 8 g/dL — ABNORMAL LOW (ref 13.0–17.0)
MCH: 32 pg (ref 26.0–34.0)
MCHC: 32.3 g/dL (ref 30.0–36.0)
MCV: 99.2 fL (ref 80.0–100.0)
Platelets: 226 10*3/uL (ref 150–400)
RBC: 2.5 MIL/uL — ABNORMAL LOW (ref 4.22–5.81)
RDW: 14.6 % (ref 11.5–15.5)
WBC: 10.4 10*3/uL (ref 4.0–10.5)
nRBC: 0 % (ref 0.0–0.2)

## 2019-11-21 LAB — PROCALCITONIN: Procalcitonin: 0.1 ng/mL

## 2019-11-21 LAB — MAGNESIUM: Magnesium: 1.9 mg/dL (ref 1.7–2.4)

## 2019-11-21 MED ORDER — POTASSIUM CHLORIDE CRYS ER 20 MEQ PO TBCR
40.0000 meq | EXTENDED_RELEASE_TABLET | Freq: Once | ORAL | Status: AC
  Administered 2019-11-21: 40 meq via ORAL
  Filled 2019-11-21: qty 2

## 2019-11-21 MED ORDER — CEPHALEXIN 250 MG PO CAPS
500.0000 mg | ORAL_CAPSULE | Freq: Three times a day (TID) | ORAL | Status: DC
  Administered 2019-11-21 – 2019-11-23 (×6): 500 mg via ORAL
  Filled 2019-11-21 (×7): qty 2

## 2019-11-21 NOTE — Progress Notes (Signed)
STROKE TEAM PROGRESS NOTE   INTERVAL HISTORY His wife and daughter are  at the bedside.  Patient is doing better today.  He is more interactive than able to answer his name and follow simple commands and moves all 4 extremities well.  He has low-grade fever.  His has been started on Keflex for cystitis. EEG shows mild diffuse encephalopathy no epileptiform activity. Vitals:   11/20/19 1949 11/21/19 0134 11/21/19 0441 11/21/19 1155  BP: (!) 168/84  140/80 (!) 163/82  Pulse: 98  (!) 51 86  Resp: 16  18 18   Temp: 98 F (36.7 C)  (!) 100.4 F (38 C) 97.8 F (36.6 C)  TempSrc: Oral  Oral Oral  SpO2: 97%  98% 100%  Weight:  93.4 kg    Height:       CBC:  Recent Labs  Lab 11/15/19 1542 11/16/19 0002 11/19/19 0443 11/21/19 0501  WBC 6.9   < > 10.4 10.4  NEUTROABS 4.5  --   --   --   HGB 11.8*   < > 8.2* 8.0*  HCT 37.3*   < > 25.3* 24.8*  MCV 102.8*   < > 99.2 99.2  PLT 172   < > 174 226   < > = values in this interval not displayed.   Basic Metabolic Panel:  Recent Labs  Lab 11/18/19 0116 11/21/19 0501  NA 140 143  K 3.7 3.3*  CL 107 108  CO2 26 26  GLUCOSE 119* 123*  BUN 26* 25*  CREATININE 1.43* 1.30*  CALCIUM 7.9* 8.0*  MG  --  1.9   Lipid Panel:  Recent Labs  Lab 11/19/19 1141  CHOL 101  TRIG 82  HDL 26*  CHOLHDL 3.9  VLDL 16  LDLCALC 59   HgbA1c:  Recent Labs  Lab 11/19/19 0908  HGBA1C 5.1   Urine Drug Screen: No results for input(s): LABOPIA, COCAINSCRNUR, LABBENZ, AMPHETMU, THCU, LABBARB in the last 168 hours.  Alcohol Level No results for input(s): ETH in the last 168 hours.  IMAGING past 24 hours DG Chest Port 1 View  Result Date: 11/21/2019 CLINICAL DATA:  Dyspnea EXAM: PORTABLE CHEST 1 VIEW COMPARISON:  11/19/2019 FINDINGS: Cardiac shadow is stable. Postsurgical changes and loop recorder are again seen and stable. The lungs are well aerated with minimal left basilar atelectasis. No new focal infiltrate is seen. IMPRESSION: Minimal left  basilar atelectasis. Electronically Signed   By: Inez Catalina M.D.   On: 11/21/2019 08:27  EEG mild diffuse encephalopathy nonspecific.  No seizures  PHYSICAL EXAM Pleasant elderly Caucasian male not in distress. . Afebrile. Head is nontraumatic. Neck is supple without bruit.    Cardiac exam no murmur or gallop. Lungs are clear to auscultation. Distal pulses are well felt.  Intermittent right upper extremity tremor.  Bilateral asterixis left hand greater than right Neurological Exam : He is awake and interactive.  Is oriented to place and person.  Follows only simple midline commands and few one-step commands.  Answers his name and speaks short sentences only.   Speech is clear.  No dysarthria or aphasia.  Extraocular movements full range without nystagmus.  Blinks to threat bilaterally.  Mild left facial droop.  Tongue midline motor system exam moves both upper extremity against gravity and lower extremities as well but less movements on the right due to hip pain.  Sensation appears intact bilaterally.  Toes are downgoing.  Reflexes are symmetric.  Gait not tested. ASSESSMENT/PLAN [name] is  a 86 y.o. male with history of of upper GI bleed, TIA, stroke, hypertension, diabetes, carotid disease and CAD presenting to ED following a mechanical fall w/ R hip fx s/p repair. On Xarelto. Post OR drowsy, lethargic w/ confusion. MRI showed multiple R cerebellar infarcts.     Toxic metabolic encephalopathy   Baseline dementia in post op pt  On Lyrica PTA, stopped.   Other Sedation meds stopped except for pain med. Try to avoid as much as possible.  EEG intermittent slowing  Anticipate improvement over next several days  Stroke:   Multiple R cerebellar and single small L parietal lobe infarcts embolic secondary to unknown etiology-possibly fat embolism from hip surgery. Stroke is not felt to be the etiology of his lethargy which appears out of proportion to the small strokes and less likely  from encephalopathy.  CT head 7/19 No acute abnormality. Small vessel disease. Old corona radiata / basal ganglia, thalamic and cerebellar lacunes. Sinus dz.   MRI  Acute R cerebellar infarcts. Punctate medial L parietal infarct. Small vessel disease.  Old corona radiata, basal ganglia, thalamic and cerebellar lacunes. Atrophy. Stable B papilledema.   Carotid Doppler  June 2021 B 40-59% stenosis (worse than previous exams)   2D Echo EF 60-65%. No source of embolus. LA dilation   TCD bubble pending    Loop, last interrogation 7/18 neg   LDL 59  HgbA1c 5.1  VTE prophylaxis - Xarelto  aspirin 81 mg daily prior to admission, now on aspirin 81 mg daily and Xarelto (rivaroxaban) daily.   Therapy recommendations:  pending   Disposition:  pending   Hypertension  Stable . Permissive hypertension (OK if < 220/120) but gradually normalize in 5-7 days . Long-term BP goal normotensive  Hyperlipidemia  Home meds:  Fish oil, pravachol 40  Now on lipitor 80  LDL 59, goal < 70  Has been intolerant to simvastatin in the past  Consider decreasing statin dose given advanced age, LDL < 78, and known simvastatin intolerance  Continue statin at discharge  Diabetes type II Controlled  HgbA1c 5.1, goal < 7.0  Other Stroke Risk Factors  Advanced age  Former Cigarette smoker, quit 14 yrs ago  Smokeless tobacco user - chew  Overweight, Body mass index is 27.93 kg/m., recommend weight loss, diet and exercise as appropriate   Hx stroke/TIA  08/2019 - transient altered mental stuats w/ LUE weakness, likley r subcortical TIA from small vessel disease.  02/23/2018 new acute stroke in the left splenium. OP workup  01/24/2018 - R temporal lobe infarct. Chronic B basal ganglia and cerebellar infarcts. OP workup  Remote hronic lacunar infarcts involving right basal ganglia, cerebellum and corona radiata  Coronary artery disease s/p CABG  PVD  Other Active Problems  Baseline  memory difficulty, followed by Dr. Leonie Man as an [facility] day # 6 He presented with a mechanical fall leading to hip fracture and postop has developed altered mental status and drowsiness which appears to be out of proportion to the tiny infarct seen on the MRI and likely represent superimposed encephalopathy probably related to cystitis.  Recommend continue antibiotics for cystitis as per medical team..  Continue Xarelto and aspirin.  Long discussion with the patient and wife at the bedside and answered questions.  Discussed with Dr. Reesa Chew.  Greater than 50% time during this 25-minute visit was spent on counseling and coordination of care about his strokes and altered mental status and answering questions.  Stroke team will sign off.  Kindly call for questions.  Antony Contras, MD To contact Stroke Continuity provider, please refer to [email or web address] After hours, contact General Neurology

## 2019-11-21 NOTE — Progress Notes (Signed)
VASCULAR LAB PRELIMINARY  PRELIMINARY  PRELIMINARY  PRELIMINARY  TCD with Bubbles completed.    Preliminary report:  See CV proc for preliminary results.  Caydance Kuehnle, RVT 11/21/2019, 3:17 PM

## 2019-11-21 NOTE — Telephone Encounter (Signed)
Dr Marlou Sa performed surgery.

## 2019-11-21 NOTE — Progress Notes (Signed)
Echocardiogram showed no acute findings in setting of elevated troponin. Primary issue currently is metabolic encephalopathy.  He should have cardiovascular follow up after dismissal. Please contact our service to arrange follow up closer to anticipated dismissal date.   Cardiology will sign off but do not hesitate to contact our service with questions or concerns.

## 2019-11-21 NOTE — Progress Notes (Signed)
If the working diagnosis is infarct from fat embolism patient should be considered to be evaluated for PFO.

## 2019-11-21 NOTE — Progress Notes (Signed)
   11/21/19 2225  Assess: MEWS Score  BP (!) 139/80  Pulse Rate 100  Assess: MEWS Score  MEWS Temp 0  MEWS Systolic 0  MEWS Pulse 0  MEWS RR 0  MEWS LOC 1  MEWS Score 1  MEWS Score Color Green  Assess: if the MEWS score is Yellow or Red  Were vital signs taken at a resting state? Yes  Document  Patient Outcome Stabilized after interventions

## 2019-11-21 NOTE — Progress Notes (Signed)
PROGRESS NOTE    [name]  NTI:[id number] DOB: 1933-12-09 DOA: 11/15/2019 PCP: [name], MD   Brief Narrative:  86 year old with history of essential hypertension, CAD status post CABG 2017, CVA, TIA, AAA, GI bleed, DM2, IBS, HLD, carotid stenosis, CKD stage III OA admitted for mechanical fall with right hip fracture requiring surgical repair on 11/16/2019 started on postop DVT prophylaxis Xarelto.  Hospital course complicated by postop anemia requiring 1 unit PRBC transfusion.  Also developed metabolic encephalopathy, CT head negative but MRI brain showed acute CVA, neurology consulted.  EEG was overall unremarkable.  Had elevated troponin therefore cardiology team consulted as well.   Assessment & Plan:   Principal Problem:   Closed right hip fracture (HCC) Active Problems:   GERD   Supine hypertension   PVD (peripheral vascular disease) (HCC)   Coronary artery disease involving coronary bypass graft of native heart without angina pectoris   Diabetes mellitus type 2, diet-controlled ([place])   CKD stage 3 due to type 2 diabetes mellitus (HCC)   Dysautonomia orthostatic hypotension syndrome (HCC)   Diabetic neuropathy (HCC)   History of lacunar cerebrovascular accident (CVA)   Elevated troponin   Stroke (cerebrum) (HCC)   Acute encephalopathy, slightly improved Acute medial left parietal lobe infarct, multifocal -Lyrica discontinued -CT head negative, MRI brain consistent with acute CVA-multifocal, unchanged bilateral papilledema, chronic microvascular ischemic changes in multiple areas of the brain, moderate cerebral atrophy -EEG-unremarkable, ammonia levels-unremarkable -Carotid Dopplers -Echocardiogram-EF 60 to 65%, moderate LVH, grade 2 DD -A1c-6.1 -LDL-59 -TSH-within normal limits -Ammonia-normal  Fevers likely secondary to acute cystitis without hematuria -5 days of oral Keflex, follow-up culture   Elevated troponin -Likely demand ischemia.  Cardiology  following -Echo-EF 86-76%, grade 2 diastolic dysfunction.  Closed right hip fracture status post surgical repair 7/21 -Postop management per orthopedic.  Status post right hip IM nail.  Eventually will need to go to SNF.  Acute blood loss anemia postop -Status post PRBC transfusion.  Hemoglobin stable.  Xarelto for DVT prophylaxis restarting  Autonomic dysfunction -Daily Florinef  Dysphagia -Speech team following.  Aspiration precautions.  CKD stage IIIA -creatinine around baseline of 1.4  History of CVA Currently on daily aspirin and Lipitor 80 mg daily.  Further recommendations by neurology team  Coronary artery disease status post CABG Currently chest pain-free.  Ambulatory dysfunction post right hip repair PT/OT recommending SNF    DVT prophylaxis: Daily Xarelto 10 mg Code Status: DNR Family Communication: Wife at bedside  Status is: Inpatient  Remains inpatient appropriate because:Inpatient level of care appropriate due to severity of illness   Dispo:  Patient From: Home  Planned Disposition: [place]  Expected discharge date: 11/22/19  Medically stable for discharge: No.  Ongoing monitoring for encephalopathy.  Still drowsy but improved compared to yesterday.  Hopefully will allow another 24 hours of Lyrica or other sedative agent clearance.  In the meantime ongoing evaluation for overnight fever Body mass index is 27.93 kg/m.       Subjective: This morning patient is slightly more awake.  Also has mild pain/burning with urination.  Fever of 100.4 overnight.  Review of Systems Otherwise negative except as per HPI, including: General: Denies  night sweats or unintended weight loss. Resp: Denies cough, wheezing, shortness of breath. Cardiac: Denies chest pain, palpitations, orthopnea, paroxysmal nocturnal dyspnea. GI: Denies abdominal pain, nausea, vomiting, diarrhea or constipation GU: Denies  hesitancy or incontinence MS: Denies  muscle aches, joint pain or swelling Neuro: Denies headache, neurologic deficits (  focal weakness, numbness, tingling), abnormal gait Psych: Denies anxiety, depression, SI/HI/AVH Skin: Denies new rashes or lesions ID: Denies sick contacts, exotic exposures, travel  Examination:  Constitutional: Not in acute distress Respiratory: Clear to auscultation bilaterally Cardiovascular: Normal sinus rhythm, no rubs Abdomen: Nontender nondistended good bowel sounds Musculoskeletal: No edema noted Skin: No rashes seen Neurologic: CN 2-12 grossly intact.  And nonfocal Psychiatric: Drowsy but easily arousable.  Alert awake oriented X3  Objective: Vitals:   11/20/19 1626 11/20/19 1949 11/21/19 0134 11/21/19 0441  BP: 124/67 (!) 168/84  140/80  Pulse: 81 98  (!) 51  Resp: 15 16  18   Temp: (!) 97.5 F (36.4 C) 98 F (36.7 C)  (!) 100.4 F (38 C)  TempSrc: Oral Oral  Oral  SpO2: 98% 97%  98%  Weight:   93.4 kg   Height:        Intake/Output Summary (Last 24 hours) at 11/21/2019 1027 Last data filed at 11/21/2019 0849 Gross per 24 hour  Intake 520 ml  Output 1610 ml  Net -1090 ml   Filed Weights   11/15/19 1405 11/20/19 0402 11/21/19 0134  Weight: 81.6 kg 93.4 kg 93.4 kg     Data Reviewed:   CBC: Recent Labs  Lab 11/15/19 1542 11/15/19 1542 11/16/19 0002 11/16/19 0002 11/17/19 0220 11/17/19 0220 11/17/19 1040 11/18/19 0116 11/18/19 1445 11/19/19 0443 11/21/19 0501  WBC 6.9   < > 13.7*  --  13.4*  --   --  9.6  --  10.4 10.4  NEUTROABS 4.5  --   --   --   --   --   --   --   --   --   --   HGB 11.8*   < > 11.8*   < > 7.9*   < > 7.0* 7.5* 8.5* 8.2* 8.0*  HCT 37.3*   < > 38.2*   < > 25.1*   < > 22.7* 23.9* 27.5* 25.3* 24.8*  MCV 102.8*   < > 102.4*  --  103.3*  --   --  99.6  --  99.2 99.2  PLT 172   < > 181  --  142*  --   --  123*  --  174 226   < > = values in this interval not displayed.   Basic Metabolic Panel: Recent Labs  Lab 11/15/19 1542 11/16/19 0002  11/17/19 0220 11/18/19 0116 11/21/19 0501  NA 142 142 138 140 143  K 3.9 4.8 4.2 3.7 3.3*  CL 106 106 106 107 108  CO2 28 28 26 26 26   GLUCOSE 153* 154* 157* 119* 123*  BUN 23 21 26* 26* 25*  CREATININE 1.44* 1.53* 1.53* 1.43* 1.30*  CALCIUM 8.6* 8.6* 8.1* 7.9* 8.0*  MG  --   --   --   --  1.9   GFR: Estimated Creatinine Clearance: 50.2 mL/min (A) (by C-G formula based on SCr of 1.3 mg/dL (H)). Liver Function Tests: Recent Labs  Lab 11/18/19 1445  AST 25  ALT 12  ALKPHOS 64  BILITOT 1.0  PROT 5.8*  ALBUMIN 2.7*   No results for input(s): LIPASE, AMYLASE in the last 168 hours. Recent Labs  Lab 11/18/19 1445 11/20/19 1413  AMMONIA 14 20   Coagulation Profile: Recent Labs  Lab 11/15/19 1542  INR 1.1   Cardiac Enzymes: No results for input(s): CKTOTAL, CKMB, CKMBINDEX, TROPONINI in the last 168 hours. BNP (last 3 results) No results for input(s): PROBNP in  the last 8760 hours. HbA1C: Recent Labs    11/19/19 0908  HGBA1C 5.1   CBG: Recent Labs  Lab 11/20/19 1650 11/20/19 2034 11/21/19 0043 11/21/19 0353 11/21/19 0721  GLUCAP 122* 127* 134* 123* 107*   Lipid Profile: Recent Labs    11/19/19 1141  CHOL 101  HDL 26*  LDLCALC 59  TRIG 82  CHOLHDL 3.9   Thyroid Function Tests: Recent Labs    11/19/19 0908  TSH 1.468   Anemia Panel: No results for input(s): VITAMINB12, FOLATE, FERRITIN, TIBC, IRON, RETICCTPCT in the last 72 hours. Sepsis Labs: Recent Labs  Lab 11/19/19 0908  PROCALCITON <0.10  LATICACIDVEN 0.9    Recent Results (from the past 240 hour(s))  SARS Coronavirus 2 by RT PCR (hospital order, performed in [facility] hospital lab) Nasopharyngeal Nasopharyngeal Swab     Status: None   Collection Time: 11/15/19  3:30 PM   Specimen: Nasopharyngeal Swab  Result Value Ref Range Status   SARS Coronavirus 2 NEGATIVE NEGATIVE Final    Comment: (NOTE) SARS-CoV-2 target nucleic acids are NOT DETECTED.  The SARS-CoV-2 RNA is generally  detectable in upper and lower respiratory specimens during the acute phase of infection. The lowest concentration of SARS-CoV-2 viral copies this assay can detect is 250 copies / mL. A negative result does not preclude SARS-CoV-2 infection and should not be used as the sole basis for treatment or other patient management decisions.  A negative result may occur with improper specimen collection / handling, submission of specimen other than nasopharyngeal swab, presence of viral mutation(s) within the areas targeted by this assay, and inadequate number of viral copies (<250 copies / mL). A negative result must be combined with clinical observations, patient history, and epidemiological information.  Fact Sheet for Patients:   StrictlyIdeas.no  Fact Sheet for Healthcare Providers: BankingDealers.co.za  This test is not yet approved or  cleared by the [place] FDA and has been authorized for detection and/or diagnosis of SARS-CoV-2 by FDA under an Emergency Use Authorization (EUA).  This EUA will remain in effect (meaning this test can be used) for the duration of the COVID-19 declaration under Section 564(b)(1) of the Act, 21 U.S.C. section 360bbb-3(b)(1), unless the authorization is terminated or revoked sooner.  Performed at [facility], [address] [place], [place] [address]   MRSA PCR Screening     Status: None   Collection Time: 11/16/19  1:03 AM   Specimen: Nasal Mucosa; Nasopharyngeal  Result Value Ref Range Status   MRSA by PCR NEGATIVE NEGATIVE Final    Comment:        The GeneXpert MRSA Assay (FDA approved for NASAL specimens only), is one component of a comprehensive MRSA colonization surveillance program. It is not intended to diagnose MRSA infection nor to guide or monitor treatment for MRSA infections. Performed at [facility], [place] [address] [place], [place] [address]          Radiology  Studies: EEG  Result Date: 11/19/2019 Lora Havens, MD     11/19/2019 12:43 PM Patient Name: ESTELLE SKIBICKI MRN: [id number] Epilepsy Attending: Lora Havens Referring Physician/Provider: Dr Irene Pap Date: 11/19/2019 Duration: 24.33 mins Patient history: 86yo M with persistent encephalopathy after closed right hip fracture post surgical repair on 11/16/2019. EEG to evaluate for seizure. EEG to evaluate for seizure Level of alertness: Awake, asleep AEDs during EEG study: Pregabalin Technical aspects: This EEG study was done with scalp electrodes positioned according to the 10-20  International system of electrode placement. Electrical activity was acquired at a sampling rate of 500Hz  and reviewed with a high frequency filter of 70Hz  and a low frequency filter of 1Hz . EEG data were recorded continuously and digitally stored. Description: The posterior dominant rhythm consists of 8 Hz activity of moderate voltage (25-35 uV) seen predominantly in posterior head regions, symmetric and reactive to eye opening and eye closing. Sleep was characterized by vertex waves, sleep spindles (12 to 14 Hz), maximal frontocentral region. EEG showed intermittent generalized 3 to 6 Hz theta-delta slowing. Hyperventilation and photic stimulation were not performed.   ABNORMALITY -Intermittent slow, generalized IMPRESSION: This study is suggestive of mild diffuse encephalopathy nonspecific etiology. No seizures or epileptiform discharges were seen throughout the recording. Lora Havens   MR BRAIN WO CONTRAST  Result Date: 11/19/2019 CLINICAL DATA:  Not alert for 3 days. EXAM: MRI HEAD WITHOUT CONTRAST TECHNIQUE: Multiplanar, multiecho pulse sequences of the brain and surrounding structures were obtained without intravenous contrast. COMPARISON:  11/18/2019 head CT.  09/11/2019 MRI head. FINDINGS: Brain: Moderate cerebral atrophy with ex vacuo dilatation. Advanced chronic microvascular ischemic changes predominantly  involving the periventricular and subcortical white matter. Sequela of remote lacunar infarcts involving the corona radiata, basal ganglia, thalami and cerebellum. There are foci of restricted diffusion with correlative T2/FLAIR hyperintense signal involving the right cerebellum. Scattered punctate foci of restricted diffusion are seen within the medial left parietal lobe (3:31, 32). No intracranial hemorrhage. No midline shift or extra-axial fluid collection. No mass lesion. Vascular: The major arterial flow voids are grossly unremarkable. Skull and upper cervical spine: Normal marrow signal. Sinuses/Orbits: Bilateral optic nerve sheath distension (6:11), unchanged since 2019. Bilateral lens replacement. Mild ethmoid sinus mucosal thickening. No mastoid effusion. Other: None. IMPRESSION: Multifocal acute right cerebellar infarcts. Punctate acute medial left parietal lobe infarct. Advanced chronic microvascular ischemic changes and sequela of remote infarcts involving the corona radiata, basal ganglia, thalami and cerebellum. Moderate cerebral atrophy. Unchanged bilateral papilledema. These results were called by telephone at the time of interpretation on 11/19/2019 at 4:03 pm to provider [facility] , who verbally acknowledged these results. Electronically Signed   By: Primitivo Gauze M.D.   On: 11/19/2019 16:09   DG Chest Port 1 View  Result Date: 11/21/2019 CLINICAL DATA:  Dyspnea EXAM: PORTABLE CHEST 1 VIEW COMPARISON:  11/19/2019 FINDINGS: Cardiac shadow is stable. Postsurgical changes and loop recorder are again seen and stable. The lungs are well aerated with minimal left basilar atelectasis. No new focal infiltrate is seen. IMPRESSION: Minimal left basilar atelectasis. Electronically Signed   By: Inez Catalina M.D.   On: 11/21/2019 08:27   ECHOCARDIOGRAM COMPLETE  Result Date: 11/19/2019    ECHOCARDIOGRAM REPORT   Patient Name:   [name] Date of Exam: 11/19/2019 Medical Rec #:  [id number]          Height:       72.0 in Accession #:    [id number]        Weight:       180.0 lb Date of Birth:  1933/09/06         BSA:          2.037 m Patient Age:    89 years          BP:           134/63 mmHg Patient Gender: M                 HR:  90 bpm. Exam Location:  Inpatient Procedure: 2D Echo, Cardiac Doppler and Color Doppler Indications:    Elevated Troponin  History:        Patient has prior history of Echocardiogram examinations, most                 recent 02/01/2018. Risk Factors:Hypertension, Diabetes,                 Dyslipidemia and GERD.  Sonographer:    Jonelle Sidle Dance Referring Phys: [id number] [place]  1. Left ventricular ejection fraction, by estimation, is 60 to 65%. The left ventricle has normal function. The left ventricle has no regional wall motion abnormalities. There is moderate concentric left ventricular hypertrophy. Left ventricular diastolic parameters are consistent with Grade II diastolic dysfunction (pseudonormalization). Elevated left atrial pressure.  2. Right ventricular systolic function is normal. The right ventricular size is normal. There is normal pulmonary artery systolic pressure. The estimated right ventricular systolic pressure is 41.3 mmHg.  3. Left atrial size was mildly dilated.  4. The mitral valve is degenerative. No evidence of mitral valve regurgitation. No evidence of mitral stenosis.  5. The aortic valve is tricuspid. Aortic valve regurgitation is not visualized. No aortic stenosis is present.  6. The inferior vena cava is normal in size with greater than 50% respiratory variability, suggesting right atrial pressure of 3 mmHg. Comparison(s): No significant change from prior study. FINDINGS  Left Ventricle: Left ventricular ejection fraction, by estimation, is 60 to 65%. The left ventricle has normal function. The left ventricle has no regional wall motion abnormalities. The left ventricular internal cavity size was normal in size. There is   moderate concentric left ventricular hypertrophy. Left ventricular diastolic parameters are consistent with Grade II diastolic dysfunction (pseudonormalization). Elevated left atrial pressure. Right Ventricle: The right ventricular size is normal. No increase in right ventricular wall thickness. Right ventricular systolic function is normal. There is normal pulmonary artery systolic pressure. The tricuspid regurgitant velocity is 2.50 m/s, and  with an assumed right atrial pressure of 3 mmHg, the estimated right ventricular systolic pressure is 24.4 mmHg. Left Atrium: Left atrial size was mildly dilated. Right Atrium: Right atrial size was normal in size. Pericardium: Trivial pericardial effusion is present. Presence of pericardial fat pad. Mitral Valve: The mitral valve is degenerative in appearance. There is mild calcification of the posterior and anterior mitral valve leaflet(s). Mild mitral annular calcification. No evidence of mitral valve regurgitation. No evidence of mitral valve stenosis. Tricuspid Valve: The tricuspid valve is grossly normal. Tricuspid valve regurgitation is mild . No evidence of tricuspid stenosis. Aortic Valve: The aortic valve is tricuspid. Aortic valve regurgitation is not visualized. No aortic stenosis is present. Pulmonic Valve: The pulmonic valve was grossly normal. Pulmonic valve regurgitation is mild. No evidence of pulmonic stenosis. Aorta: The aortic root and ascending aorta are structurally normal, with no evidence of dilitation. Venous: The inferior vena cava is normal in size with greater than 50% respiratory variability, suggesting right atrial pressure of 3 mmHg. IAS/Shunts: The atrial septum is grossly normal.  LEFT VENTRICLE PLAX 2D LVIDd:         4.50 cm  Diastology LVIDs:         3.70 cm  LV e' lateral:   7.29 cm/s LV PW:         1.70 cm  LV E/e' lateral: 13.6 LV IVS:        1.50 cm  LV e' medial:  4.68 cm/s LVOT diam:     2.00 cm  LV E/e' medial:  21.1 LV SV:          72 LV SV Index:   35 LVOT Area:     3.14 cm  RIGHT VENTRICLE             IVC RV Basal diam:  2.70 cm     IVC diam: 1.70 cm RV S prime:     10.10 cm/s TAPSE (M-mode): 1.5 cm LEFT ATRIUM             Index       RIGHT ATRIUM           Index LA diam:        4.70 cm 2.31 cm/m  RA Area:     16.40 cm LA Vol (A2C):   68.1 ml 33.43 ml/m RA Volume:   37.90 ml  18.60 ml/m LA Vol (A4C):   79.3 ml 38.92 ml/m LA Biplane Vol: 76.7 ml 37.65 ml/m  AORTIC VALVE LVOT Vmax:   120.00 cm/s LVOT Vmean:  73.350 cm/s LVOT VTI:    0.228 m  AORTA Ao Root diam: 3.70 cm Ao Asc diam:  3.30 cm MITRAL VALVE               TRICUSPID VALVE MV Area (PHT): 3.42 cm    TR Peak grad:   25.0 mmHg MV Decel Time: 222 msec    TR Vmax:        250.00 cm/s MV E velocity: 98.80 cm/s MV A velocity: 96.70 cm/s  SHUNTS MV E/A ratio:  1.02        Systemic VTI:  0.23 m                            Systemic Diam: 2.00 cm Eleonore Chiquito MD Electronically signed by Eleonore Chiquito MD Signature Date/Time: 11/19/2019/3:38:06 PM    Final         Scheduled Meds: . aspirin EC  81 mg Oral Daily  . atorvastatin  80 mg Oral Daily  . docusate sodium  100 mg Oral BID  . fludrocortisone  0.1 mg Oral Daily  . insulin aspart  0-9 Units Subcutaneous Q4H  . pantoprazole  40 mg Oral Daily  . polyethylene glycol  17 g Oral Daily  . pregabalin  100 mg Oral Daily  . rivaroxaban  10 mg Oral Q supper   Continuous Infusions: . sodium chloride 50 mL/hr at 11/19/19 2300     LOS: 6 days   Time spent= 35 mins    Jaclyn Carew Arsenio Loader, MD [facility]  If 7PM-7AM, please contact night-coverage  11/21/2019, 10:27 AM

## 2019-11-21 NOTE — Progress Notes (Signed)
SLP Cancellation Note  Patient Details Name: [name] MRN: [id number] DOB: 12/07/1933   Cancelled treatment:       Reason Eval/Treat Not Completed: Patient at procedure or test/unavailable (Pt off unit at this time. RN indicated that he has gone to vascular, but has been tolerating the current diet without significant difficulty despite his need for additional time. SLP will follow up on subsequent date.)  Ilene Witcher [name], [place], [place] Office number [phone] Pager [place] 11/21/2019, 2:59 PM

## 2019-11-21 NOTE — Progress Notes (Signed)
Physical Therapy Treatment Patient Details Name: [name] MRN: [id number] DOB: Aug 24, 1933 Today's Date: 11/21/2019    History of Present Illness [name] is a 86 y.o. male with a history of diet-controlled type 2 diabetes with stage III chronic kidney disease and diabetic neuropathy, coronary artery disease status post bypass, CVA.  Pt presents with a fall and subsequent right hip intertrochanteric fracture. Now s/p IMHS nailing 11/16/2019.    PT Comments    Pt was seen with nursing staff assist to get to side of bed and then up in bed after working on sitting balance control.  Pt is somewhat getting forward propulsion with abd mm's to sit in more control, and went from 2 max to min assist to sit on side of bed by end of session.  Pt is able to engage his RLE mm's to exercise, do ROM and to reposition on the bed.  Follow acutely to progress his mobility and safety toward a better tolerance for standing and being OOB.   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, pusher syndrome Restrictions Weight Bearing Restrictions: Yes RLE Weight Bearing: Touchdown weight bearing Other Position/Activity Restrictions: For transfers only    Mobility  Bed Mobility Overal bed mobility: Needs Assistance Bed Mobility: Supine to Sit;Sit to Supine     Supine to sit: Max assist;+2 for physical assistance;+2 for safety/equipment Sit to supine: Max assist;+2 for physical assistance;+2 for safety/equipment   General bed mobility comments: pt was cooperative with moving him although cannot assist functionally with that task  Transfers                 General transfer comment: deferred due to his alertness and ability to stand with TDWB  Ambulation/Gait              General Gait Details: deferred, unable   Stairs             Wheelchair Mobility    Modified Rankin (Stroke Patients Only) Modified Rankin (Stroke Patients Only) Pre-Morbid Rankin Score: No symptoms Modified Rankin: Severe disability     Balance   Sitting-balance support: Feet supported;Bilateral upper extremity supported Sitting balance-Leahy Scale: Poor                                      Cognition Arousal/Alertness: Awake/alert;Lethargic (increased time with eyes open on side of bed) Behavior During Therapy: Flat affect Overall Cognitive Status: Impaired/Different from baseline Area of Impairment: Awareness;Problem solving;Safety/judgement;Following commands;Memory;Attention;Orientation                 Orientation Level: Time;Situation Current Attention Level: Selective Memory: Decreased short-term memory Following Commands: Follows one step commands inconsistently;Follows one step commands with increased time Safety/Judgement: Decreased awareness of safety;Decreased awareness of deficits Awareness: Intellectual Problem Solving: Slow processing;Decreased initiation;Difficulty sequencing;Requires verbal cues;Requires tactile cues General Comments: pt began to assist with forward pull of abd mm's at times while sitting on side of bed      Exercises General Exercises - Lower Extremity Ankle Circles/Pumps: AROM;AAROM;5 reps Quad Sets: AROM;10 reps Hip ABduction/ADduction: AAROM;10 reps    General Comments General comments (skin integrity, edema, etc.): pt is up to side of bed, worked on midline  control and awareness of his R side including controlling pusher syndrome      Pertinent Vitals/Pain Pain Assessment: 0-10 Pain Score: 10-Worst pain ever Faces Pain Scale: Hurts little more Pain Location: R hip Pain Descriptors / Indicators: Guarding Pain Intervention(s): Limited activity within patient's tolerance;Monitored during  session;Premedicated before session;Repositioned    Home Living                      Prior Function            PT Goals (current goals can now be found in the care plan section) Acute Rehab PT Goals Patient Stated Goal: none stated    Frequency    Min 3X/week      PT Plan Current plan remains appropriate    Co-evaluation              AM-PAC PT "6 Clicks" Mobility   Outcome Measure  Help needed turning from your back to your side while in a flat bed without using bedrails?: A Lot Help needed moving from lying on your back to sitting on the side of a flat bed without using bedrails?: A Lot Help needed moving to and from a bed to a chair (including a wheelchair)?: Total Help needed standing up from a chair using your arms (e.g., wheelchair or bedside chair)?: Total Help needed to walk in hospital room?: Total Help needed climbing 3-5 steps with a railing? : Total 6 Click Score: 8    End of Session Equipment Utilized During Treatment: Oxygen Activity Tolerance: Patient limited by fatigue;Patient limited by lethargy;Treatment limited secondary to medical complications (Comment) (TDWB on RLE) Patient left: in bed;with call bell/phone within reach;with bed alarm set;with family/visitor present;with nursing/sitter in room Nurse Communication: Need for lift equipment PT Visit Diagnosis: Pain;Other abnormalities of gait and mobility (R26.89) Pain - Right/Left: Right Pain - part of body: Hip     Time: 1050-1120 PT Time Calculation (min) (ACUTE ONLY): 30 min  Charges:                       [name] 11/21/2019, 12:13 PM  [name], PT MS Acute Rehab Dept. Number: [place] and [place]

## 2019-11-21 NOTE — Telephone Encounter (Signed)
Received call from Clare Gandy (PT) with [facility] advised he went to see patient and wife told him patient fell 11/15/2019. Gershon Mussel stated patient has a broken Femur and is currently in the hospital. The number to contact Gershon Mussel is [phone]

## 2019-11-21 NOTE — Progress Notes (Signed)
   11/21/19 2218  Assess: MEWS Score  ECG Heart Rate (!) 103  Assess: MEWS Score  MEWS Temp 0  MEWS Systolic 0  MEWS Pulse 1  MEWS RR 0  MEWS LOC 1  MEWS Score 2  MEWS Score Color Yellow  Assess: if the MEWS score is Yellow or Red  Were vital signs taken at a resting state? No (pt was being turned and cleaned up earlier )  Focused Assessment No change from prior assessment  Early Detection of Sepsis Score *See Row Information* Low  MEWS guidelines implemented *See Row Information* No, altered LOC is baseline  Document  Progress note created (see row info) Yes

## 2019-11-21 NOTE — Progress Notes (Signed)
ANTICOAGULATION CONSULT NOTE - Follow Up Consult  Pharmacy Consult for Xarelto Indication: VTE prophylaxis  Allergies  Allergen Reactions  . Other Other (See Comments)    Pt reports allergic to a medication but unsure of name or type of med  . Cymbalta [Duloxetine Hcl] Other (See Comments)    Dizziness, hallucinations.   . Keflex [Cephalexin] Other (See Comments)    dizziness  . Simvastatin Other (See Comments)    Joint pain  . Sudafed [Pseudoephedrine] Other (See Comments)    Dizziness    Patient Measurements: Height: 6' (182.9 cm) Weight: 93.4 kg (205 lb 14.6 oz) IBW/kg (Calculated) : 77.6  Vital Signs: Temp: 100.4 F (38 C) (07/22 0441) Temp Source: Oral (07/22 0441) BP: 140/80 (07/22 0441) Pulse Rate: 51 (07/22 0441)  Labs: Recent Labs    11/18/19 1445 11/18/19 1445 11/19/19 0443 11/19/19 0908 11/19/19 1141 11/19/19 1303 11/21/19 0501  HGB 8.5*   < > 8.2*  --   --   --  8.0*  HCT 27.5*  --  25.3*  --   --   --  24.8*  PLT  --   --  174  --   --   --  226  CREATININE  --   --   --   --   --   --  1.30*  TROPONINIHS  --   --   --  1,194* 1,252* 1,081*  --    < > = values in this interval not displayed.    Estimated Creatinine Clearance: 50.2 mL/min (A) (by C-G formula based on SCr of 1.3 mg/dL (H)).   Medical History: Past Medical History:  Diagnosis Date  . AAA (abdominal aortic aneurysm) ([place])   . 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 CAD (coronary artery disease)    a. s/p CABGx4 in 07/2015.  . Carotid artery disease ([place])   . Colon polyps   . Diabetes ([place])   . Difficult intubation   . 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 Esophageal reflux   . High cholesterol   . History of IBS 02/27/2009  . Hypertension    pt denies, he says he has a h/o hypotension. If BP up he adjusts the [place]  . Jejunitis with partial SBO 05/20/2017  . Orthostatic hypotension   . Prostatitis   . Stroke ([place])   . TIA (transient ischemic attack)   . Upper GI bleed  06/24/2018  . Vitamin B 12 deficiency   . Vitamin D deficiency     Assessment: 86 year old male status post right hip repair following a mechanical fall. Pharmacy consulted to dose Xarelto for VTE prophylaxis. Patient has been receiving rivaroxaban 10mg  daily.   Goal of Therapy:  Monitor platelets by anticoagulation protocol: Yes   Plan:  - Continue Xarelto 10 mg PO daily tomorrow for 3 weeks - Continue aspirin 81mg  per neurology due to acute CVA - Monitor renal function and signs and symptoms of bleeding    Cristela Felt, PharmD Clinical Pharmacist  11/21/2019, 8:39 AM   Please check AMION for all [place] phone numbers After 10:00 PM, call the [facility] [phone]

## 2019-11-22 ENCOUNTER — Inpatient Hospital Stay (HOSPITAL_COMMUNITY): Payer: Medicare Other

## 2019-11-22 ENCOUNTER — Telehealth: Payer: Self-pay | Admitting: Specialist

## 2019-11-22 DIAGNOSIS — E119 Type 2 diabetes mellitus without complications: Secondary | ICD-10-CM | POA: Diagnosis not present

## 2019-11-22 DIAGNOSIS — I25810 Atherosclerosis of coronary artery bypass graft(s) without angina pectoris: Secondary | ICD-10-CM | POA: Diagnosis not present

## 2019-11-22 DIAGNOSIS — E1122 Type 2 diabetes mellitus with diabetic chronic kidney disease: Secondary | ICD-10-CM | POA: Diagnosis not present

## 2019-11-22 DIAGNOSIS — S72001G Fracture of unspecified part of neck of right femur, subsequent encounter for closed fracture with delayed healing: Secondary | ICD-10-CM | POA: Diagnosis not present

## 2019-11-22 LAB — CBC
HCT: 25.4 % — ABNORMAL LOW (ref 39.0–52.0)
Hemoglobin: 7.9 g/dL — ABNORMAL LOW (ref 13.0–17.0)
MCH: 30.9 pg (ref 26.0–34.0)
MCHC: 31.1 g/dL (ref 30.0–36.0)
MCV: 99.2 fL (ref 80.0–100.0)
Platelets: 254 10*3/uL (ref 150–400)
RBC: 2.56 MIL/uL — ABNORMAL LOW (ref 4.22–5.81)
RDW: 14.6 % (ref 11.5–15.5)
WBC: 14 10*3/uL — ABNORMAL HIGH (ref 4.0–10.5)
nRBC: 0 % (ref 0.0–0.2)

## 2019-11-22 LAB — BLOOD CULTURE ID PANEL (REFLEXED)

## 2019-11-22 LAB — BASIC METABOLIC PANEL
Anion gap: 9 (ref 5–15)
BUN: 23 mg/dL (ref 8–23)
CO2: 27 mmol/L (ref 22–32)
Calcium: 8.4 mg/dL — ABNORMAL LOW (ref 8.9–10.3)
Chloride: 108 mmol/L (ref 98–111)
Creatinine, Ser: 1.2 mg/dL (ref 0.61–1.24)
GFR calc Af Amer: >60 mL/min (ref >60)
GFR calc non Af Amer: 55 mL/min — ABNORMAL LOW (ref >60)
Glucose, Bld: 140 mg/dL — ABNORMAL HIGH (ref 70–99)
Potassium: 3.2 mmol/L — ABNORMAL LOW (ref 3.5–5.1)
Sodium: 144 mmol/L (ref 135–145)

## 2019-11-22 LAB — CSF CELL COUNT WITH DIFFERENTIAL
RBC Count, CSF: 17 /mm3 — ABNORMAL HIGH
Tube #: 1
WBC, CSF: 1 /mm3 (ref 0–5)

## 2019-11-22 LAB — GLUCOSE, CAPILLARY
Glucose-Capillary: 121 mg/dL — ABNORMAL HIGH (ref 70–99)
Glucose-Capillary: 122 mg/dL — ABNORMAL HIGH (ref 70–99)
Glucose-Capillary: 130 mg/dL — ABNORMAL HIGH (ref 70–99)
Glucose-Capillary: 135 mg/dL — ABNORMAL HIGH (ref 70–99)
Glucose-Capillary: 155 mg/dL — ABNORMAL HIGH (ref 70–99)
Glucose-Capillary: 156 mg/dL — ABNORMAL HIGH (ref 70–99)

## 2019-11-22 LAB — MAGNESIUM: Magnesium: 2 mg/dL (ref 1.7–2.4)

## 2019-11-22 LAB — PROTEIN, CSF: Total  Protein, CSF: 32 mg/dL (ref 15–45)

## 2019-11-22 LAB — GLUCOSE, CSF: Glucose, CSF: 95 mg/dL — ABNORMAL HIGH (ref 40–70)

## 2019-11-22 MED ORDER — LIDOCAINE HCL (PF) 1 % IJ SOLN: 5.0000 mL | Freq: Once | INTRAMUSCULAR | Status: AC

## 2019-11-22 MED ORDER — PREGABALIN 25 MG PO CAPS: 25.0000 mg | ORAL_CAPSULE | Freq: Every day | ORAL | Status: DC

## 2019-11-22 MED ORDER — SODIUM CHLORIDE 0.9 % IV BOLUS
1000.0000 mL | Freq: Once | INTRAVENOUS | Status: AC
  Administered 2019-11-22: 1000 mL via INTRAVENOUS

## 2019-11-22 MED ORDER — LIDOCAINE HCL (PF) 1 % IJ SOLN
INTRAMUSCULAR | Status: AC
  Administered 2019-11-22: 4 mL
  Filled 2019-11-22: qty 5

## 2019-11-22 MED ORDER — PREGABALIN 25 MG PO CAPS
25.0000 mg | ORAL_CAPSULE | Freq: Once | ORAL | Status: AC
  Administered 2019-11-23: 25 mg via ORAL
  Filled 2019-11-22: qty 1

## 2019-11-22 MED ORDER — POTASSIUM CHLORIDE 10 MEQ/100ML IV SOLN
10.0000 meq | INTRAVENOUS | Status: AC
  Administered 2019-11-22 (×4): 10 meq via INTRAVENOUS
  Filled 2019-11-22 (×4): qty 100

## 2019-11-22 MED ORDER — PREGABALIN 25 MG PO CAPS
50.0000 mg | ORAL_CAPSULE | Freq: Every day | ORAL | Status: DC
  Administered 2019-11-22: 50 mg via ORAL
  Filled 2019-11-22: qty 2

## 2019-11-22 NOTE — Progress Notes (Signed)
SLP Cancellation Note  Patient Details Name: [name] MRN: [id number] DOB: [birth year]   Cancelled treatment:       Reason Eval/Treat Not Completed: Fatigue/lethargy limiting ability to participate (Pt is lethargic this morning per RN. SLP will follow up. )  Burhanuddin Kohlmann [name], [place], [place] Office number [phone] Pager [place] 11/22/2019, 10:12 AM

## 2019-11-22 NOTE — Telephone Encounter (Signed)
IC s/w patients wife.  She states her concerns had been addressed. She did not need anything further at this time.

## 2019-11-22 NOTE — Telephone Encounter (Signed)
Pts wife called stating the pt is in the hospital and she would like a CB in regards to this.    [phone]

## 2019-11-22 NOTE — Progress Notes (Signed)
PT Cancellation Note  Patient Details Name: [name] MRN: [id number] DOB: 1933/08/03   Cancelled Treatment:    Reason Eval/Treat Not Completed: (P) Patient at procedure or test/unavailable Pt off the floor for procedure. PT will follow back next week for treatment.   [name] B. [name] PT, DPT Acute Rehabilitation Services Pager [phone] Office [phone]    [place] 11/22/2019, 3:45 PM

## 2019-11-22 NOTE — Procedures (Signed)
Technically successful fluoroscopically guided lumbar puncture at the L3-L4 level. No immediate postprocedure complication.  13.5 mL of CSF were obtained and sent to the laboratory for analysis.

## 2019-11-22 NOTE — Progress Notes (Signed)
PROGRESS NOTE    [name]  JHE:[id number] DOB: [birth year] DOA: 11/15/2019 PCP: [name], MD   Brief Narrative:  86 year old with history of essential hypertension, CAD status post CABG 2017, CVA, TIA, AAA, GI bleed, DM2, IBS, HLD, carotid stenosis, CKD stage III OA admitted for mechanical fall with right hip fracture requiring surgical repair on 11/16/2019 started on postop DVT prophylaxis Xarelto.  Hospital course complicated by postop anemia requiring 1 unit PRBC transfusion.  Also developed metabolic encephalopathy, CT head negative but MRI brain showed acute CVA, neurology consulted.  EEG was overall unremarkable.  Had elevated troponin therefore cardiology team consulted as well.   Assessment & Plan:   Principal Problem:   Closed right hip fracture (HCC) Active Problems:   GERD   Supine hypertension   PVD (peripheral vascular disease) (HCC)   Coronary artery disease involving coronary bypass graft of native heart without angina pectoris   Diabetes mellitus type 2, diet-controlled ([place])   CKD stage 3 due to type 2 diabetes mellitus (HCC)   Dysautonomia orthostatic hypotension syndrome (HCC)   Diabetic neuropathy (HCC)   History of lacunar cerebrovascular accident (CVA)   Elevated troponin   Stroke (cerebrum) (HCC)   Acute encephalopathy, persist this morning Acute medial left parietal lobe infarct, multifocal -Taper off Lyrica Lyrica 50 mg today, 25 mg tomorrow and then discontinued. -CT head negative, MRI brain consistent with acute CVA-multifocal, unchanged bilateral papilledema, chronic microvascular ischemic changes in multiple areas of the brain, moderate cerebral atrophy -EEG-unremarkable, ammonia levels-unremarkable -Transcranial Dopplers-negative bubble study -Echocardiogram-EF 60 to 65%, moderate LVH, grade 2 DD -A1c-6.1 -LDL-59 -TSH-within normal limits -Ammonia-normal -Conflicting picture between acute cystitis versus other cause of infection  leading to encephalopathy. Will order lumbar puncture with routine CSF fluid studies and opening pressure. Spoke with Dr. Ernestina Patches from orthopedic per family's request  Fevers likely secondary to acute cystitis without hematuria -5 days of oral Keflex, follow-up culture   Elevated troponin -Likely demand ischemia.  Cardiology following -Echo-EF 18-56%, grade 2 diastolic dysfunction.  Closed right hip fracture status post surgical repair 7/21 -Postop management per orthopedic.  Status post right hip IM nail.  Eventually will need to go to SNF.  Acute blood loss anemia postop -Status post PRBC transfusion.  Hemoglobin stable.  Xarelto for DVT prophylaxis restarting  Sinus tachycardia -Suspect intravascular volume depletion, 1 L normal saline bolus.  Autonomic dysfunction -Daily Florinef  Dysphagia -Speech team following.  Aspiration precautions.  CKD stage IIIA -creatinine around baseline of 1.4  History of CVA Currently on daily aspirin and Lipitor 80 mg daily.  Further recommendations by neurology team  Coronary artery disease status post CABG Currently chest pain-free.  Ambulatory dysfunction post right hip repair PT/OT recommending SNF    DVT prophylaxis: Daily Xarelto 10 mg Code Status: DNR Family Communication: Family members at bedside  Status is: Inpatient  Remains inpatient appropriate because:Inpatient level of care appropriate due to severity of illness   Dispo:  Patient From: Home  Planned Disposition: Reserve  Expected discharge date: 1-2-day  Medically stable for discharge: No. Patient is again encephalopathic this morning therefore will require lumbar puncture and further evaluation. Maintain hospital stay for at least another day or 2 Body mass index is 27.57 kg/m.       Subjective: Patient seen and examined this morning he is quite drowsy again. Wakes up briefly and answers basic questions but then drifts back to sleep.  Apparently has been like this since yesterday evening. Remains afebrile  denies any other complaints at this time. Family present at bedside requesting that I speak with Dr. Louanne Skye or Dr. Ernestina Patches from orthopedic prior to proceeding with lumbar puncture.  Review of Systems Otherwise negative except as per HPI, including: General: Denies fever, chills, night sweats or unintended weight loss. Resp: Denies cough, wheezing, shortness of breath. Cardiac: Denies chest pain, palpitations, orthopnea, paroxysmal nocturnal dyspnea. GI: Denies abdominal pain, nausea, vomiting, diarrhea or constipation GU: Denies dysuria, frequency, hesitancy or incontinence MS: Denies muscle aches, joint pain or swelling Neuro: Denies headache, neurologic deficits (focal weakness, numbness, tingling), abnormal gait Psych: Denies anxiety, depression, SI/HI/AVH Skin: Denies new rashes or lesions ID: Denies sick contacts, exotic exposures, travel Examination:  Constitutional: Very drowsy/lethargic but protecting his airways Respiratory: Clear to auscultation bilaterally Cardiovascular: Normal sinus rhythm, no rubs Abdomen: Nontender nondistended good bowel sounds Musculoskeletal: No edema noted Skin: Right hip dressing noted Neurologic: Difficult to assess, follows very basic commands. Pupils are bilaterally reactive and equal Psychiatric: Very drowsy this morning difficulty assess. Alert to his name Objective: Vitals:   11/21/19 1155 11/21/19 2052 11/21/19 2225 11/22/19 0532  BP: (!) 163/82 (!) 177/77 (!) 139/80 (!) 156/83  Pulse: 86 105 100 (!) 108  Resp: 18 16  18   Temp: 97.8 F (36.6 C) 99.6 F (37.6 C)  99.9 F (37.7 C)  TempSrc: Oral Oral  Oral  SpO2: 100% 99%  100%  Weight:    (!) 92.2 kg  Height:        Intake/Output Summary (Last 24 hours) at 11/22/2019 1040 Last data filed at 11/22/2019 0836 Gross per 24 hour  Intake 440 ml  Output 900 ml  Net -460 ml   Filed Weights   11/20/19 0402 11/21/19  0134 11/22/19 0532  Weight: 93.4 kg 93.4 kg (!) 92.2 kg     Data Reviewed:   CBC: Recent Labs  Lab 11/15/19 1542 11/16/19 0002 11/17/19 0220 11/17/19 1040 11/18/19 0116 11/18/19 1445 11/19/19 0443 11/21/19 0501 11/22/19 0647  WBC 6.9   < > 13.4*  --  9.6  --  10.4 10.4 14.0*  NEUTROABS 4.5  --   --   --   --   --   --   --   --   HGB 11.8*   < > 7.9*   < > 7.5* 8.5* 8.2* 8.0* 7.9*  HCT 37.3*   < > 25.1*   < > 23.9* 27.5* 25.3* 24.8* 25.4*  MCV 102.8*   < > 103.3*  --  99.6  --  99.2 99.2 99.2  PLT 172   < > 142*  --  123*  --  174 226 254   < > = values in this interval not displayed.   Basic Metabolic Panel: Recent Labs  Lab 11/16/19 0002 11/17/19 0220 11/18/19 0116 11/21/19 0501 11/22/19 0647  NA 142 138 140 143 144  K 4.8 4.2 3.7 3.3* 3.2*  CL 106 106 107 108 108  CO2 28 26 26 26 27   GLUCOSE 154* 157* 119* 123* 140*  BUN 21 26* 26* 25* 23  CREATININE 1.53* 1.53* 1.43* 1.30* 1.20  CALCIUM 8.6* 8.1* 7.9* 8.0* 8.4*  MG  --   --   --  1.9 2.0   GFR: Estimated Creatinine Clearance: 50.3 mL/min (by C-G formula based on SCr of 1.2 mg/dL). Liver Function Tests: Recent Labs  Lab 11/18/19 1445  AST 25  ALT 12  ALKPHOS 64  BILITOT 1.0  PROT 5.8*  ALBUMIN 2.7*  No results for input(s): LIPASE, AMYLASE in the last 168 hours. Recent Labs  Lab 11/18/19 1445 11/20/19 1413  AMMONIA 14 20   Coagulation Profile: Recent Labs  Lab 11/15/19 1542  INR 1.1   Cardiac Enzymes: No results for input(s): CKTOTAL, CKMB, CKMBINDEX, TROPONINI in the last 168 hours. BNP (last 3 results) No results for input(s): PROBNP in the last 8760 hours. HbA1C: No results for input(s): HGBA1C in the last 72 hours. CBG: Recent Labs  Lab 11/21/19 1635 11/21/19 2015 11/22/19 0010 11/22/19 0442 11/22/19 0814  GLUCAP 140* 168* 130* 122* 156*   Lipid Profile: Recent Labs    11/19/19 1141  CHOL 101  HDL 26*  LDLCALC 59  TRIG 82  CHOLHDL 3.9   Thyroid Function  Tests: No results for input(s): TSH, T4TOTAL, FREET4, T3FREE, THYROIDAB in the last 72 hours. Anemia Panel: No results for input(s): VITAMINB12, FOLATE, FERRITIN, TIBC, IRON, RETICCTPCT in the last 72 hours. Sepsis Labs: Recent Labs  Lab 11/19/19 0908 11/21/19 0909  PROCALCITON <0.10 0.10  LATICACIDVEN 0.9  --     Recent Results (from the past 240 hour(s))  SARS Coronavirus 2 by RT PCR (hospital order, performed in [facility] hospital lab) Nasopharyngeal Nasopharyngeal Swab     Status: None   Collection Time: 11/15/19  3:30 PM   Specimen: Nasopharyngeal Swab  Result Value Ref Range Status   SARS Coronavirus 2 NEGATIVE NEGATIVE Final    Comment: (NOTE) SARS-CoV-2 target nucleic acids are NOT DETECTED.  The SARS-CoV-2 RNA is generally detectable in upper and lower respiratory specimens during the acute phase of infection. The lowest concentration of SARS-CoV-2 viral copies this assay can detect is 250 copies / mL. A negative result does not preclude SARS-CoV-2 infection and should not be used as the sole basis for treatment or other patient management decisions.  A negative result may occur with improper specimen collection / handling, submission of specimen other than nasopharyngeal swab, presence of viral mutation(s) within the areas targeted by this assay, and inadequate number of viral copies (<250 copies / mL). A negative result must be combined with clinical observations, patient history, and epidemiological information.  Fact Sheet for Patients:   StrictlyIdeas.no  Fact Sheet for Healthcare Providers: BankingDealers.co.za  This test is not yet approved or  cleared by the [place] FDA and has been authorized for detection and/or diagnosis of SARS-CoV-2 by FDA under an Emergency Use Authorization (EUA).  This EUA will remain in effect (meaning this test can be used) for the duration of the COVID-19 declaration under  Section 564(b)(1) of the Act, 21 U.S.C. section 360bbb-3(b)(1), unless the authorization is terminated or revoked sooner.  Performed at [facility], [address], [place], [place] [address]   MRSA PCR Screening     Status: None   Collection Time: 11/16/19  1:03 AM   Specimen: Nasal Mucosa; Nasopharyngeal  Result Value Ref Range Status   MRSA by PCR NEGATIVE NEGATIVE Final    Comment:        The GeneXpert MRSA Assay (FDA approved for NASAL specimens only), is one component of a comprehensive MRSA colonization surveillance program. It is not intended to diagnose MRSA infection nor to guide or monitor treatment for MRSA infections. Performed at [facility], [place] [address], [place], [place] [address]   Culture, blood (Routine X 2) w Reflex to ID Panel     Status: None (Preliminary result)   Collection Time: 11/21/19  9:09 AM   Specimen: BLOOD  RIGHT HAND  Result Value Ref Range Status   Specimen Description BLOOD RIGHT HAND  Final   Special Requests   Final    BOTTLES DRAWN AEROBIC ONLY Blood Culture adequate volume   Culture  Setup Time   Final    GRAM POSITIVE COCCI IN CLUSTERS AEROBIC BOTTLE ONLY Organism ID to follow CRITICAL RESULT CALLED TO, READ BACK BY AND VERIFIED WITH: Sarajane Marek [id number] SK  Performed at [facility], [place] [address] [place], [place] [address]    Culture GRAM POSITIVE COCCI  Final   Report Status PENDING  Incomplete  Blood Culture ID Panel (Reflexed)     Status: Abnormal   Collection Time: 11/21/19  9:09 AM  Result Value Ref Range Status   Enterococcus species NOT DETECTED NOT DETECTED Final   Listeria monocytogenes NOT DETECTED NOT DETECTED Final   Staphylococcus species DETECTED (A) NOT DETECTED Final    Comment: Methicillin (oxacillin) susceptible coagulase negative staphylococcus. Possible blood culture contaminant (unless isolated from more than one blood culture draw or clinical case suggests pathogenicity). No antibiotic  treatment is indicated for blood  culture contaminants. CRITICAL RESULT CALLED TO, READ BACK BY AND VERIFIED WITH: Sarajane Marek [id number] AT 1034 SK     Staphylococcus aureus (BCID) NOT DETECTED NOT DETECTED Final   Methicillin resistance NOT DETECTED NOT DETECTED Final   Streptococcus species NOT DETECTED NOT DETECTED Final   Streptococcus agalactiae NOT DETECTED NOT DETECTED Final   Streptococcus pneumoniae NOT DETECTED NOT DETECTED Final   Streptococcus pyogenes NOT DETECTED NOT DETECTED Final   Acinetobacter baumannii NOT DETECTED NOT DETECTED Final   Enterobacteriaceae species NOT DETECTED NOT DETECTED Final   Enterobacter cloacae complex NOT DETECTED NOT DETECTED Final   Escherichia coli NOT DETECTED NOT DETECTED Final   Klebsiella oxytoca NOT DETECTED NOT DETECTED Final   Klebsiella pneumoniae NOT DETECTED NOT DETECTED Final   Proteus species NOT DETECTED NOT DETECTED Final   Serratia marcescens NOT DETECTED NOT DETECTED Final   Haemophilus influenzae NOT DETECTED NOT DETECTED Final   Neisseria meningitidis NOT DETECTED NOT DETECTED Final   Pseudomonas aeruginosa NOT DETECTED NOT DETECTED Final   Candida albicans NOT DETECTED NOT DETECTED Final   Candida glabrata NOT DETECTED NOT DETECTED Final   Candida krusei NOT DETECTED NOT DETECTED Final   Candida parapsilosis NOT DETECTED NOT DETECTED Final   Candida tropicalis NOT DETECTED NOT DETECTED Final    Comment: Performed at [facility], [address], [place], [place] [address]  Culture, blood (Routine X 2) w Reflex to ID Panel     Status: None (Preliminary result)   Collection Time: 11/21/19  9:13 AM   Specimen: BLOOD LEFT HAND  Result Value Ref Range Status   Specimen Description BLOOD LEFT HAND  Final   Special Requests   Final    BOTTLES DRAWN AEROBIC ONLY Blood Culture results may not be optimal due to an inadequate volume of blood received in culture bottles   Culture   Final    NO GROWTH < 24 HOURS Performed  at [facility], [place] [address] [place], [place] [address]    Report Status PENDING  Incomplete         Radiology Studies: DG Chest Port 1 View  Result Date: 11/21/2019 CLINICAL DATA:  Dyspnea EXAM: PORTABLE CHEST 1 VIEW COMPARISON:  11/19/2019 FINDINGS: Cardiac shadow is stable. Postsurgical changes and loop recorder are again seen and stable. The lungs are well aerated  with minimal left basilar atelectasis. No new focal infiltrate is seen. IMPRESSION: Minimal left basilar atelectasis. Electronically Signed   By: Inez Catalina M.D.   On: 11/21/2019 08:27   VAS [place] TRANSCRANIAL DOPPLER W BUBBLES  Result Date: 11/21/2019  Transcranial Doppler with Bubble Indications: Stroke. Limitations: Altered mental status Limitations for diagnostic windows: Unable to insonate right transtemporal window. Unable to insonate left transtemporal window. Comparison Study: No prior TCD bubble study. Prior TCD done 10/22/19 Performing Technologist: Sharion Dove RVS  Examination Guidelines: A complete evaluation includes B-mode imaging, spectral Doppler, color Doppler, and power Doppler as needed of all accessible portions of each vessel. Bilateral testing is considered an integral part of a complete examination. Limited examinations for reoccurring indications may be performed as noted.  Summary: No HITS at rest or during Valsalva. Negative transcranial Doppler Bubble study with no evidence of right to left intracardiac communication.  A vascular evaluation was performed. The right Orbital was studied. An IV was inserted into the patient's right Right hand. Verbal informed consent was obtained.  *See table(s) above for TCD measurements and observations.    Preliminary         Scheduled Meds: . aspirin EC  81 mg Oral Daily  . atorvastatin  80 mg Oral Daily  . cephALEXin  500 mg Oral Q8H  . docusate sodium  100 mg Oral BID  . fludrocortisone  0.1 mg Oral Daily  . insulin aspart  0-9 Units Subcutaneous Q4H   . pantoprazole  40 mg Oral Daily  . polyethylene glycol  17 g Oral Daily  . [START ON 11/23/2019] pregabalin  25 mg Oral Once  . rivaroxaban  10 mg Oral Q supper   Continuous Infusions: . sodium chloride 50 mL/hr at 11/19/19 2300     LOS: 7 days   Time spent= 35 mins    Sieara Bremer Arsenio Loader, MD [facility]  If 7PM-7AM, please contact night-coverage  11/22/2019, 10:40 AM

## 2019-11-22 NOTE — Progress Notes (Signed)
PHARMACY - PHYSICIAN COMMUNICATION CRITICAL VALUE ALERT - BLOOD CULTURE IDENTIFICATION (BCID)  [name] is an 86 y.o. male who presented to [facility] on [facility]/15/2019.  Blood culture drawn on 7/22 is positive for GPC in clusters, coagulase negative staph by BCID.  Assessment:  Cephalexin started for possible UTI.  Positive blood culture can representation contamination  Name of physician (or Provider) Contacted: Dr. Reesa Chew paged  Current antibiotics: cephalexin  Changes to prescribed antibiotics recommended:  Patient is on recommended antibiotics - No changes needed  Results for orders placed or performed during the hospital encounter of 11/15/19  Blood Culture ID Panel (Reflexed) (Collected: 11/21/2019  9:09 AM)  Result Value Ref Range   Enterococcus species NOT DETECTED NOT DETECTED   Listeria monocytogenes NOT DETECTED NOT DETECTED   Staphylococcus species DETECTED (A) NOT DETECTED   Staphylococcus aureus (BCID) NOT DETECTED NOT DETECTED   Methicillin resistance NOT DETECTED NOT DETECTED   Streptococcus species NOT DETECTED NOT DETECTED   Streptococcus agalactiae NOT DETECTED NOT DETECTED   Streptococcus pneumoniae NOT DETECTED NOT DETECTED   Streptococcus pyogenes NOT DETECTED NOT DETECTED   Acinetobacter baumannii NOT DETECTED NOT DETECTED   Enterobacteriaceae species NOT DETECTED NOT DETECTED   Enterobacter cloacae complex NOT DETECTED NOT DETECTED   Escherichia coli NOT DETECTED NOT DETECTED   Klebsiella oxytoca NOT DETECTED NOT DETECTED   Klebsiella pneumoniae NOT DETECTED NOT DETECTED   Proteus species NOT DETECTED NOT DETECTED   Serratia marcescens NOT DETECTED NOT DETECTED   Haemophilus influenzae NOT DETECTED NOT DETECTED   Neisseria meningitidis NOT DETECTED NOT DETECTED   Pseudomonas aeruginosa NOT DETECTED NOT DETECTED   Candida albicans NOT DETECTED NOT DETECTED   Candida glabrata NOT DETECTED NOT DETECTED   Candida krusei NOT DETECTED NOT DETECTED    Candida parapsilosis NOT DETECTED NOT DETECTED   Candida tropicalis NOT DETECTED NOT DETECTED    Candie Mile 11/22/2019  11:28 AM

## 2019-11-23 DIAGNOSIS — S72001G Fracture of unspecified part of neck of right femur, subsequent encounter for closed fracture with delayed healing: Secondary | ICD-10-CM | POA: Diagnosis not present

## 2019-11-23 DIAGNOSIS — E1122 Type 2 diabetes mellitus with diabetic chronic kidney disease: Secondary | ICD-10-CM | POA: Diagnosis not present

## 2019-11-23 DIAGNOSIS — E1342 Other specified diabetes mellitus with diabetic polyneuropathy: Secondary | ICD-10-CM | POA: Diagnosis not present

## 2019-11-23 DIAGNOSIS — I25810 Atherosclerosis of coronary artery bypass graft(s) without angina pectoris: Secondary | ICD-10-CM | POA: Diagnosis not present

## 2019-11-23 LAB — CBC
HCT: 22.6 % — ABNORMAL LOW (ref 39.0–52.0)
Hemoglobin: 7.1 g/dL — ABNORMAL LOW (ref 13.0–17.0)
MCH: 31 pg (ref 26.0–34.0)
MCHC: 31.4 g/dL (ref 30.0–36.0)
MCV: 98.7 fL (ref 80.0–100.0)
Platelets: 262 10*3/uL (ref 150–400)
RBC: 2.29 MIL/uL — ABNORMAL LOW (ref 4.22–5.81)
RDW: 14.7 % (ref 11.5–15.5)
WBC: 12.5 10*3/uL — ABNORMAL HIGH (ref 4.0–10.5)
nRBC: 0 % (ref 0.0–0.2)

## 2019-11-23 LAB — GLUCOSE, CAPILLARY
Glucose-Capillary: 116 mg/dL — ABNORMAL HIGH (ref 70–99)
Glucose-Capillary: 120 mg/dL — ABNORMAL HIGH (ref 70–99)
Glucose-Capillary: 134 mg/dL — ABNORMAL HIGH (ref 70–99)
Glucose-Capillary: 135 mg/dL — ABNORMAL HIGH (ref 70–99)
Glucose-Capillary: 145 mg/dL — ABNORMAL HIGH (ref 70–99)
Glucose-Capillary: 159 mg/dL — ABNORMAL HIGH (ref 70–99)

## 2019-11-23 LAB — BASIC METABOLIC PANEL
Anion gap: 9 (ref 5–15)
BUN: 23 mg/dL (ref 8–23)
CO2: 25 mmol/L (ref 22–32)
Calcium: 8.1 mg/dL — ABNORMAL LOW (ref 8.9–10.3)
Chloride: 109 mmol/L (ref 98–111)
Creatinine, Ser: 1.21 mg/dL (ref 0.61–1.24)
GFR calc Af Amer: >60 mL/min (ref >60)
GFR calc non Af Amer: 55 mL/min — ABNORMAL LOW (ref >60)
Glucose, Bld: 130 mg/dL — ABNORMAL HIGH (ref 70–99)
Potassium: 3.3 mmol/L — ABNORMAL LOW (ref 3.5–5.1)
Sodium: 143 mmol/L (ref 135–145)

## 2019-11-23 LAB — MAGNESIUM: Magnesium: 1.9 mg/dL (ref 1.7–2.4)

## 2019-11-23 MED ORDER — CEFAZOLIN SODIUM-DEXTROSE 2-4 GM/100ML-% IV SOLN
2.0000 g | INTRAVENOUS | Status: AC
  Administered 2019-11-23: 2 g via INTRAVENOUS
  Filled 2019-11-23: qty 100

## 2019-11-23 MED ORDER — CEFAZOLIN SODIUM-DEXTROSE 2-4 GM/100ML-% IV SOLN
2.0000 g | Freq: Three times a day (TID) | INTRAVENOUS | Status: DC
  Administered 2019-11-23 – 2019-11-27 (×11): 2 g via INTRAVENOUS
  Filled 2019-11-23 (×11): qty 100

## 2019-11-23 NOTE — Progress Notes (Addendum)
PROGRESS NOTE    [name]  WRU:[id number] DOB: January 23, 1934 DOA: 11/15/2019 PCP: [name], MD   Brief Narrative:  86 year old with history of essential hypertension, CAD status post CABG 2017, CVA, TIA, AAA, GI bleed, DM2, IBS, HLD, carotid stenosis, CKD stage III OA admitted for mechanical fall with right hip fracture requiring surgical repair on 11/16/2019 started on postop DVT prophylaxis Xarelto.  Hospital course complicated by postop anemia requiring 1 unit PRBC transfusion.  Also developed metabolic encephalopathy, CT head negative but MRI brain showed acute CVA, neurology consulted.  EEG was overall unremarkable.  Had elevated troponin therefore cardiology team consulted as well.   Assessment & Plan:   Principal Problem:   Closed right hip fracture (HCC) Active Problems:   GERD   Supine hypertension   PVD (peripheral vascular disease) (HCC)   Coronary artery disease involving coronary bypass graft of native heart without angina pectoris   Diabetes mellitus type 2, diet-controlled ([place])   CKD stage 3 due to type 2 diabetes mellitus (HCC)   Dysautonomia orthostatic hypotension syndrome (HCC)   Diabetic neuropathy (HCC)   History of lacunar cerebrovascular accident (CVA)   Elevated troponin   Stroke (cerebrum) (HCC)   Acute encephalopathy, minimal improvement from yesterday Acute medial left parietal lobe infarct, multifocal -Lyrica tapered off -CT head negative, MRI brain consistent with acute CVA-multifocal, unchanged bilateral papilledema, chronic microvascular ischemic changes in multiple areas of the brain, moderate cerebral atrophy -EEG-unremarkable, ammonia levels-unremarkable -Transcranial Dopplers-negative bubble study -Echocardiogram-EF 60 to 65%, moderate LVH, grade 2 DD -A1c-6.1 -LDL-59 -TSH-within normal limits -Ammonia-normal -Repeat CT head 7/23-negative for acute pathology -LP 7/23-negative for any evidence of infection  Fevers likely  secondary to acute cystitis without hematuria -Complete total 5-day course of Keflex  2nd set of Bcx positive concerning for MSSE, will change keflex to Ancef.    Elevated troponin -Likely demand ischemia.  Cardiology following -Echo-EF 91-47%, grade 2 diastolic dysfunction.  Closed right hip fracture status post surgical repair 7/21 -Postop management per orthopedic.  Status post right hip IM nail.  Eventually will need to go to SNF.  Acute blood loss anemia postop -Status post PRBC transfusion.  Hemoglobin stable.  Xarelto for DVT prophylaxis restarting.  Hemoglobin this morning 7.1, transfuse 1 unit if it drops below 7.0.  Sinus tachycardia -Improved with IV fluids  Autonomic dysfunction -Daily Florinef  Dysphagia -Speech team following.  Aspiration precautions.  CKD stage IIIA -creatinine around baseline of 1.4  History of CVA Currently on daily aspirin and Lipitor 80 mg daily.  Further recommendations by neurology team  Coronary artery disease status post CABG Currently chest pain-free.  Ambulatory dysfunction post right hip repair PT/OT recommending SNF    DVT prophylaxis: Daily Xarelto 10 mg Code Status: DNR Family Communication: Wife at bedside  Status is: Inpatient  Remains inpatient appropriate because:Inpatient level of care appropriate due to severity of illness   Dispo:  Patient From: Home  Planned Disposition: [place]  Expected discharge date: 1-2-day  Medically stable for discharge: No.  P.o. intake is still very inconsistent due to him being encephalopathic and drowsy. Body mass index is 27.87 kg/m.       Subjective: Seen and examined at bedside, still drowsy.  Minimal oral intake, wife is at the bedside.  Tolerated his lumbar puncture yesterday.  He is easily arousable and answers basic questions and denies any complaints  Review of Systems Otherwise negative except as per HPI, including: General: Denies fever,  chills, night sweats  or unintended weight loss. Resp: Denies cough, wheezing, shortness of breath. Cardiac: Denies chest pain, palpitations, orthopnea, paroxysmal nocturnal dyspnea. GI: Denies abdominal pain, nausea, vomiting, diarrhea or constipation GU: Denies dysuria, frequency, hesitancy or incontinence MS: Denies muscle aches, joint pain or swelling Neuro: Denies headache, neurologic deficits (focal weakness, numbness, tingling), abnormal gait Psych: Denies anxiety, depression, SI/HI/AVH Skin: Denies new rashes or lesions ID: Denies sick contacts, exotic exposures, travel Examination: Constitutional: Very drowsy but easily arousable Respiratory: Clear to auscultation bilaterally Cardiovascular: Normal sinus rhythm, no rubs Abdomen: Nontender nondistended good bowel sounds Musculoskeletal: No edema noted Skin: No rashes seen Neurologic: Pupils are reactive to light bilaterally and equal.  Grossly moving all the extremities.  No focal neuro deficits Psychiatric: Alert to name and place.  Objective: Vitals:   11/22/19 2300 11/23/19 0206 11/23/19 0448 11/23/19 1037  BP:   (!) 142/73 (!) 146/76  Pulse:   100 98  Resp:   18   Temp:   98.1 F (36.7 C)   TempSrc:   Oral   SpO2:   98% 97%  Weight: 92 kg (!) 93.2 kg    Height:        Intake/Output Summary (Last 24 hours) at 11/23/2019 1058 Last data filed at 11/23/2019 0450 Gross per 24 hour  Intake 4215.84 ml  Output 1300 ml  Net 2915.84 ml   Filed Weights   11/22/19 0532 11/22/19 2300 11/23/19 0206  Weight: (!) 92.2 kg 92 kg (!) 93.2 kg     Data Reviewed:   CBC: Recent Labs  Lab 11/18/19 0116 11/18/19 0116 11/18/19 1445 11/19/19 0443 11/21/19 0501 11/22/19 0647 11/23/19 0704  WBC 9.6  --   --  10.4 10.4 14.0* 12.5*  HGB 7.5*   < > 8.5* 8.2* 8.0* 7.9* 7.1*  HCT 23.9*   < > 27.5* 25.3* 24.8* 25.4* 22.6*  MCV 99.6  --   --  99.2 99.2 99.2 98.7  PLT 123*  --   --  174 226 254 262   < > = values in this interval  not displayed.   Basic Metabolic Panel: Recent Labs  Lab 11/17/19 0220 11/18/19 0116 11/21/19 0501 11/22/19 0647 11/23/19 0704  NA 138 140 143 144 143  K 4.2 3.7 3.3* 3.2* 3.3*  CL 106 107 108 108 109  CO2 26 26 26 27 25   GLUCOSE 157* 119* 123* 140* 130*  BUN 26* 26* 25* 23 23  CREATININE 1.53* 1.43* 1.30* 1.20 1.21  CALCIUM 8.1* 7.9* 8.0* 8.4* 8.1*  MG  --   --  1.9 2.0 1.9   GFR: Estimated Creatinine Clearance: 53.9 mL/min (by C-G formula based on SCr of 1.21 mg/dL). Liver Function Tests: Recent Labs  Lab 11/18/19 1445  AST 25  ALT 12  ALKPHOS 64  BILITOT 1.0  PROT 5.8*  ALBUMIN 2.7*   No results for input(s): LIPASE, AMYLASE in the last 168 hours. Recent Labs  Lab 11/18/19 1445 11/20/19 1413  AMMONIA 14 20   Coagulation Profile: No results for input(s): INR, PROTIME in the last 168 hours. Cardiac Enzymes: No results for input(s): CKTOTAL, CKMB, CKMBINDEX, TROPONINI in the last 168 hours. BNP (last 3 results) No results for input(s): PROBNP in the last 8760 hours. HbA1C: No results for input(s): HGBA1C in the last 72 hours. CBG: Recent Labs  Lab 11/22/19 1728 11/22/19 2008 11/23/19 0028 11/23/19 0358 11/23/19 0737  GLUCAP 121* 135* 135* 116* 120*   Lipid Profile: No results for input(s): CHOL, HDL, LDLCALC,  TRIG, CHOLHDL, LDLDIRECT in the last 72 hours. Thyroid Function Tests: No results for input(s): TSH, T4TOTAL, FREET4, T3FREE, THYROIDAB in the last 72 hours. Anemia Panel: No results for input(s): VITAMINB12, FOLATE, FERRITIN, TIBC, IRON, RETICCTPCT in the last 72 hours. Sepsis Labs: Recent Labs  Lab 11/19/19 0908 11/21/19 0909  PROCALCITON <0.10 0.10  LATICACIDVEN 0.9  --     Recent Results (from the past 240 hour(s))  SARS Coronavirus 2 by RT PCR (hospital order, performed in [facility] hospital lab) Nasopharyngeal Nasopharyngeal Swab     Status: None   Collection Time: 11/15/19  3:30 PM   Specimen: Nasopharyngeal Swab  Result Value  Ref Range Status   SARS Coronavirus 2 NEGATIVE NEGATIVE Final    Comment: (NOTE) SARS-CoV-2 target nucleic acids are NOT DETECTED.  The SARS-CoV-2 RNA is generally detectable in upper and lower respiratory specimens during the acute phase of infection. The lowest concentration of SARS-CoV-2 viral copies this assay can detect is 250 copies / mL. A negative result does not preclude SARS-CoV-2 infection and should not be used as the sole basis for treatment or other patient management decisions.  A negative result may occur with improper specimen collection / handling, submission of specimen other than nasopharyngeal swab, presence of viral mutation(s) within the areas targeted by this assay, and inadequate number of viral copies (<250 copies / mL). A negative result must be combined with clinical observations, patient history, and epidemiological information.  Fact Sheet for Patients:   StrictlyIdeas.no  Fact Sheet for Healthcare Providers: BankingDealers.co.za  This test is not yet approved or  cleared by the [place] FDA and has been authorized for detection and/or diagnosis of SARS-CoV-2 by FDA under an Emergency Use Authorization (EUA).  This EUA will remain in effect (meaning this test can be used) for the duration of the COVID-19 declaration under Section 564(b)(1) of the Act, 21 U.S.C. section 360bbb-3(b)(1), unless the authorization is terminated or revoked sooner.  Performed at [facility], [address] [place], [place] [address]   MRSA PCR Screening     Status: None   Collection Time: 11/16/19  1:03 AM   Specimen: Nasal Mucosa; Nasopharyngeal  Result Value Ref Range Status   MRSA by PCR NEGATIVE NEGATIVE Final    Comment:        The GeneXpert MRSA Assay (FDA approved for NASAL specimens only), is one component of a comprehensive MRSA colonization surveillance program. It is not intended to diagnose  MRSA infection nor to guide or monitor treatment for MRSA infections. Performed at [facility], [place] [address], [place], [place] [address]   Culture, blood (Routine X 2) w Reflex to ID Panel     Status: Abnormal (Preliminary result)   Collection Time: 11/21/19  9:09 AM   Specimen: BLOOD RIGHT HAND  Result Value Ref Range Status   Specimen Description BLOOD RIGHT HAND  Final   Special Requests   Final    BOTTLES DRAWN AEROBIC ONLY Blood Culture adequate volume   Culture  Setup Time   Final    GRAM POSITIVE COCCI IN CLUSTERS AEROBIC BOTTLE ONLY CRITICAL RESULT CALLED TO, READ BACK BY AND VERIFIED WITH: PHARMD Andres Shad [id number] SK     Culture (A)  Final    STAPHYLOCOCCUS EPIDERMIDIS SUSCEPTIBILITIES TO FOLLOW Performed at [facility], [place] [address], [place], [place] [address]    Report Status PENDING  Incomplete  Blood Culture ID Panel (Reflexed)     Status: Abnormal  Collection Time: 11/21/19  9:09 AM  Result Value Ref Range Status   Enterococcus species NOT DETECTED NOT DETECTED Final   Listeria monocytogenes NOT DETECTED NOT DETECTED Final   Staphylococcus species DETECTED (A) NOT DETECTED Final    Comment: Methicillin (oxacillin) susceptible coagulase negative staphylococcus. Possible blood culture contaminant (unless isolated from more than one blood culture draw or clinical case suggests pathogenicity). No antibiotic treatment is indicated for blood  culture contaminants. CRITICAL RESULT CALLED TO, READ BACK BY AND VERIFIED WITH: Sarajane Marek [id number] 1034 SK     Staphylococcus aureus (BCID) NOT DETECTED NOT DETECTED Final   Methicillin resistance NOT DETECTED NOT DETECTED Final   Streptococcus species NOT DETECTED NOT DETECTED Final   Streptococcus agalactiae NOT DETECTED NOT DETECTED Final   Streptococcus pneumoniae NOT DETECTED NOT DETECTED Final   Streptococcus pyogenes NOT DETECTED NOT DETECTED Final   Acinetobacter baumannii NOT DETECTED NOT  DETECTED Final   Enterobacteriaceae species NOT DETECTED NOT DETECTED Final   Enterobacter cloacae complex NOT DETECTED NOT DETECTED Final   Escherichia coli NOT DETECTED NOT DETECTED Final   Klebsiella oxytoca NOT DETECTED NOT DETECTED Final   Klebsiella pneumoniae NOT DETECTED NOT DETECTED Final   Proteus species NOT DETECTED NOT DETECTED Final   Serratia marcescens NOT DETECTED NOT DETECTED Final   Haemophilus influenzae NOT DETECTED NOT DETECTED Final   Neisseria meningitidis NOT DETECTED NOT DETECTED Final   Pseudomonas aeruginosa NOT DETECTED NOT DETECTED Final   Candida albicans NOT DETECTED NOT DETECTED Final   Candida glabrata NOT DETECTED NOT DETECTED Final   Candida krusei NOT DETECTED NOT DETECTED Final   Candida parapsilosis NOT DETECTED NOT DETECTED Final   Candida tropicalis NOT DETECTED NOT DETECTED Final    Comment: Performed at [facility], [address] [place], [place] [address]  Culture, blood (Routine X 2) w Reflex to ID Panel     Status: Abnormal (Preliminary result)   Collection Time: 11/21/19  9:13 AM   Specimen: BLOOD LEFT HAND  Result Value Ref Range Status   Specimen Description BLOOD LEFT HAND  Final   Special Requests   Final    BOTTLES DRAWN AEROBIC ONLY Blood Culture results may not be optimal due to an inadequate volume of blood received in culture bottles   Culture  Setup Time   Final    GRAM POSITIVE COCCI IN CLUSTERS AEROBIC BOTTLE ONLY CRITICAL VALUE NOTED.  VALUE IS CONSISTENT WITH PREVIOUSLY REPORTED AND CALLED VALUE. Performed at [facility], [place] [address] [place], [place] [address]    Culture STAPHYLOCOCCUS EPIDERMIDIS (A)  Final   Report Status PENDING  Incomplete  CSF culture     Status: None (Preliminary result)   Collection Time: 11/22/19  5:15 PM   Specimen: PATH Cytology CSF; Cerebrospinal Fluid  Result Value Ref Range Status   Specimen Description CSF  Final   Special Requests NONE  Final   Gram Stain   Final     WBC PRESENT, PREDOMINANTLY MONONUCLEAR NO ORGANISMS SEEN CYTOSPIN SMEAR    Culture   Final    NO GROWTH < 24 HOURS Performed at [facility], [place] [address], [place], [place] [address]    Report Status PENDING  Incomplete         Radiology Studies: CT HEAD WO CONTRAST  Result Date: 11/22/2019 CLINICAL DATA:  Altered mental status. EXAM: CT HEAD WITHOUT CONTRAST TECHNIQUE: Contiguous axial images were obtained from the base of the skull  through the vertex without intravenous contrast. COMPARISON:  11/18/2019 FINDINGS: Brain: Stable moderately enlarged ventricles and subarachnoid spaces. Stable marked patchy white matter low density in both cerebral hemispheres. Stable old left basal ganglia and right external capsule and corona radiata infarcts. No intracranial hemorrhage, mass lesion or CT evidence of acute infarction. Vascular: No hyperdense vessel or unexpected calcification. Skull: Normal. Negative for fracture or focal lesion. Sinuses/Orbits: Status post bilateral cataract extraction. Unremarkable bones and included paranasal sinuses. Other: None. IMPRESSION: 1. No acute abnormality. 2. Stable atrophy, chronic small vessel white matter ischemic changes and old infarcts. Electronically Signed   By: Claudie Revering M.D.   On: 11/22/2019 16:37   VAS [place] TRANSCRANIAL DOPPLER W BUBBLES  Result Date: 11/22/2019  Transcranial Doppler with Bubble Indications: Stroke. Limitations: Altered mental status Limitations for diagnostic windows: Unable to insonate right transtemporal window. Unable to insonate left transtemporal window. Comparison Study: No prior TCD bubble study. Prior TCD done 10/22/19 Performing Technologist: Sharion Dove RVS  Examination Guidelines: A complete evaluation includes B-mode imaging, spectral Doppler, color Doppler, and power Doppler as needed of all accessible portions of each vessel. Bilateral testing is considered an integral part of a complete examination. Limited  examinations for reoccurring indications may be performed as noted.  Summary: No HITS at rest or during Valsalva. Negative transcranial Doppler Bubble study with no evidence of right to left intracardiac communication.  A vascular evaluation was performed. The right Orbital was studied. An IV was inserted into the patient's right Right hand. Verbal informed consent was obtained.  Negative TCD Bubble study *See table(s) above for TCD measurements and observations.  Diagnosing physician: Antony Contras MD Electronically signed by Antony Contras MD on 11/22/2019 at 2:22:57 PM.    Final    DG FLUORO GUIDE LUMBAR PUNCTURE  Result Date: 11/22/2019 CLINICAL DATA:  Encephalopathy. EXAM: DIAGNOSTIC LUMBAR PUNCTURE UNDER FLUOROSCOPIC GUIDANCE FLUOROSCOPY TIME:  Fluoroscopy Time:  24 seconds. Radiation Exposure Index (if provided by the fluoroscopic device): 12.8 mGy Number of Acquired Spot Images: 1 PROCEDURE: Informed consent was obtained from the patient's wife, Aleksandar Duve via telephone prior to the procedure. This included a discussion of potential risks of the procedure including but not limited to headache, allergy, pain. With the patient prone, the lower back was prepped with Betadine. 1% Lidocaine was used for local anesthesia. Lumbar puncture was performed at the L3-L4 level using a 20 gauge needle with return of clear CSF. 13.5 ml of CSF were obtained for laboratory studies. The patient tolerated the procedure well and there were no apparent complications. IMPRESSION: Technically successful fluoroscopically guided lumbar puncture at the L3-L4 level. No immediate postprocedure complication. 13.5 mL of CSF were obtained and sent to the laboratory for analysis. Electronically Signed   By: Kellie Simmering DO   On: 11/22/2019 16:59        Scheduled Meds: . aspirin EC  81 mg Oral Daily  . atorvastatin  80 mg Oral Daily  . cephALEXin  500 mg Oral Q8H  . docusate sodium  100 mg Oral BID  . fludrocortisone  0.1 mg  Oral Daily  . insulin aspart  0-9 Units Subcutaneous Q4H  . pantoprazole  40 mg Oral Daily  . polyethylene glycol  17 g Oral Daily  . rivaroxaban  10 mg Oral Q supper   Continuous Infusions: . sodium chloride 50 mL/hr at 11/23/19 0213     LOS: 8 days   Time spent= 35 mins    Hillary Schwegler Arsenio Loader, MD [facility]  If 7PM-7AM, please contact night-coverage  11/23/2019, 10:58 AM

## 2019-11-23 NOTE — TOC Progression Note (Addendum)
Transition of Care [facility]) - Progression Note    Patient Details  Name: [name] MRN: [id number] Date of Birth: [birth year]  Transition of Care [facility]) CM/SW [place], LCSW Phone Number: [phone] 11/23/2019, 11:20 AM  Clinical Narrative:     CSW spoke with spouse and dghtr (patient sleeping); family willing to take [facility] when patient is ready for dc.CSW reached out to [facility] to confirm bed offer, however had to leave vm.   TOC team will continue to assist with discharge planning needs.     Barriers to Discharge: Continued Medical Work up  Expected Discharge Plan and Services         Living arrangements for the past 2 months: Single Family Home                                       Social Determinants of Health (SDOH) Interventions    Readmission Risk Interventions No flowsheet data found.

## 2019-11-23 NOTE — Progress Notes (Signed)
Paged and poke to Ortho Tech on call to apply the CPM on the patient. Ortho Tech is aware.

## 2019-11-23 NOTE — Progress Notes (Signed)
  Speech Language Pathology Treatment: Dysphagia  Patient Details Name: [name] MRN: [id number] DOB: 03-19-1934 Today's Date: 11/23/2019 Time: 1105-1130 SLP Time Calculation (min) (ACUTE ONLY): 25 min  Assessment / Plan / Recommendation Clinical Impression  Patient seen to address dysphagia goals with spouse and daughter present. Per family, patient continues to be very lethargic and wife has not been able to get him to eat much of his meal trays due to this. Wife also stated that he will start verbally refusing PO's. Patient consumed 4-5 teaspoon sips and one controlled, small cup sip of honey thick liquids and did not exhibit any overt s/s of aspiration or penetration. He did exhibit delays in oral transit of boluses as well as delayed swallow initiation. No oral residuals observed post PO intake. Patient required frequent auditory and tactile cues to alert. Patient is at a high risk of inadequate nutrition and for dehydration, as well as aspiration. When SLP discussed with family about puree solids, they both informed SLP that patient would refuse it because he has in the past.     HPI HPI:  86 y.o. male with a history of diet-controlled type 2 diabetes with stage III chronic kidney disease and diabetic neuropathy, coronary artery disease status post bypass, CVA.  Patient presents with right hip fx after a mechanical fall.  Underwent right hip IM nail placement 7/17. Now with acute metabolic encephalopathy.  [name] present, she reports hx of increasing swallowing difficulty in the last several months, with coughing/choking during meals.       SLP Plan  Continue with current plan of care       Recommendations  Diet recommendations: Honey-thick liquid;Dysphagia 3 (mechanical soft) Liquids provided via: Teaspoon;Cup;No straw Medication Administration: Whole meds with puree Supervision: Full supervision/cueing for compensatory strategies;Staff to assist with self  feeding Compensations: Slow rate;Small sips/bites Postural Changes and/or Swallow Maneuvers: Seated upright 90 degrees                Oral Care Recommendations: Oral care BID;Oral care before and after PO;Staff/trained caregiver to provide oral care Follow up Recommendations: Skilled Nursing facility SLP Visit Diagnosis: Dysphagia, oropharyngeal phase (R13.12) Plan: Continue with current plan of care       [place], MA, [facility]

## 2019-11-23 NOTE — Progress Notes (Signed)
Orthopedic Tech Progress Note Patient Details:  ROMEL DUMOND 1934-04-07 [id number] Took degree to 50. Family was also concerned and would like to speak with Ortho Dr. Livingston Diones Right Knee CPM Right Knee: On Right Knee Flexion (Degrees): 0 Right Knee Extension (Degrees): 50 Additional Comments:  (3hrs completed for today)  Post Interventions Patient Tolerated: Well  Ellouise Newer 11/23/2019, 4:41 PM

## 2019-11-23 NOTE — Progress Notes (Signed)
Pharmacy Antibiotic Note  [name] is a 86 y.o. male admitted on 11/15/2019 with right hip fracture after a fall s/p surgical repair, now with concern for sepsis.  Pharmacy has been consulted for cefazolin dosing.  -Afebrile. Leukocytosis declining - WBC 12.5 <<14.0. -Renal function improving throughout stay- SCr 1.2, CrCl ~54.   Patient was already on cephalexin for fevers thought to be from UTI but change to IV cefazolin was indicated due to positive blood culture results.   Plan: D/c cephalexin Start cefazolin 2gm every 8 hours  Height: 6' (182.9 cm) Weight: (!) 93.2 kg (205 lb 7.5 oz) IBW/kg (Calculated) : 77.6  Temp (24hrs), Avg:98 F (36.7 C), Min:97.8 F (36.6 C), Max:98.1 F (36.7 C)  Recent Labs  Lab 11/17/19 0220 11/17/19 0220 11/18/19 0116 11/19/19 0443 11/19/19 0908 11/21/19 0501 11/22/19 0647 11/23/19 0704  WBC 13.4*   < > 9.6 10.4  --  10.4 14.0* 12.5*  CREATININE 1.53*  --  1.43*  --   --  1.30* 1.20 1.21  LATICACIDVEN  --   --   --   --  0.9  --   --   --    < > = values in this interval not displayed.    Estimated Creatinine Clearance: 53.9 mL/min (by C-G formula based on SCr of 1.21 mg/dL).    Allergies  Allergen Reactions  . Other Other (See Comments)    Pt reports allergic to a medication but unsure of name or type of med  . Cymbalta [Duloxetine Hcl] Other (See Comments)    Dizziness, hallucinations.   . Keflex [Cephalexin] Other (See Comments)    dizziness  . Simvastatin Other (See Comments)    Joint pain  . Sudafed [Pseudoephedrine] Other (See Comments)    Dizziness    Antimicrobials this admission: 7/17 vancomycin x1 7/22 cephalexin >> 7/24 7/24 cefazolin >>   Dose adjustments this admission:  Microbiology results: 7/22 BCx 2/2: staph epi 7/17 MRSA PCR: neg  Thank you for allowing pharmacy to be a part of this patient's care.  [place] 11/23/2019 2:24 PM

## 2019-11-24 ENCOUNTER — Inpatient Hospital Stay (HOSPITAL_COMMUNITY): Payer: Medicare Other

## 2019-11-24 DIAGNOSIS — B957 Other staphylococcus as the cause of diseases classified elsewhere: Secondary | ICD-10-CM | POA: Insufficient documentation

## 2019-11-24 DIAGNOSIS — E1342 Other specified diabetes mellitus with diabetic polyneuropathy: Secondary | ICD-10-CM | POA: Diagnosis not present

## 2019-11-24 DIAGNOSIS — I25810 Atherosclerosis of coronary artery bypass graft(s) without angina pectoris: Secondary | ICD-10-CM | POA: Diagnosis not present

## 2019-11-24 DIAGNOSIS — S72001G Fracture of unspecified part of neck of right femur, subsequent encounter for closed fracture with delayed healing: Secondary | ICD-10-CM | POA: Diagnosis not present

## 2019-11-24 DIAGNOSIS — R7881 Bacteremia: Secondary | ICD-10-CM | POA: Insufficient documentation

## 2019-11-24 DIAGNOSIS — B9561 Methicillin susceptible Staphylococcus aureus infection as the cause of diseases classified elsewhere: Secondary | ICD-10-CM

## 2019-11-24 DIAGNOSIS — E1122 Type 2 diabetes mellitus with diabetic chronic kidney disease: Secondary | ICD-10-CM | POA: Diagnosis not present

## 2019-11-24 LAB — BASIC METABOLIC PANEL
Anion gap: 7 (ref 5–15)
BUN: 22 mg/dL (ref 8–23)
CO2: 26 mmol/L (ref 22–32)
Calcium: 8 mg/dL — ABNORMAL LOW (ref 8.9–10.3)
Chloride: 110 mmol/L (ref 98–111)
Creatinine, Ser: 1.23 mg/dL (ref 0.61–1.24)
GFR calc Af Amer: >60 mL/min (ref >60)
GFR calc non Af Amer: 54 mL/min — ABNORMAL LOW (ref >60)
Glucose, Bld: 149 mg/dL — ABNORMAL HIGH (ref 70–99)
Potassium: 3.1 mmol/L — ABNORMAL LOW (ref 3.5–5.1)
Sodium: 143 mmol/L (ref 135–145)

## 2019-11-24 LAB — GLUCOSE, CAPILLARY
Glucose-Capillary: 133 mg/dL — ABNORMAL HIGH (ref 70–99)
Glucose-Capillary: 145 mg/dL — ABNORMAL HIGH (ref 70–99)
Glucose-Capillary: 149 mg/dL — ABNORMAL HIGH (ref 70–99)
Glucose-Capillary: 151 mg/dL — ABNORMAL HIGH (ref 70–99)
Glucose-Capillary: 157 mg/dL — ABNORMAL HIGH (ref 70–99)
Glucose-Capillary: 162 mg/dL — ABNORMAL HIGH (ref 70–99)

## 2019-11-24 LAB — CULTURE, BLOOD (ROUTINE X 2): Special Requests: ADEQUATE

## 2019-11-24 LAB — CBC
HCT: 17.4 % — ABNORMAL LOW (ref 39.0–52.0)
Hemoglobin: 5.6 g/dL — CL (ref 13.0–17.0)
MCH: 32 pg (ref 26.0–34.0)
MCHC: 32.2 g/dL (ref 30.0–36.0)
MCV: 99.4 fL (ref 80.0–100.0)
Platelets: 355 10*3/uL (ref 150–400)
RBC: 1.75 MIL/uL — ABNORMAL LOW (ref 4.22–5.81)
RDW: 14.8 % (ref 11.5–15.5)
WBC: 11.6 10*3/uL — ABNORMAL HIGH (ref 4.0–10.5)
nRBC: 0 % (ref 0.0–0.2)

## 2019-11-24 LAB — HEMOGLOBIN AND HEMATOCRIT, BLOOD
HCT: 30.8 % — ABNORMAL LOW (ref 39.0–52.0)
Hemoglobin: 9.7 g/dL — ABNORMAL LOW (ref 13.0–17.0)

## 2019-11-24 LAB — PREPARE RBC (CROSSMATCH)

## 2019-11-24 LAB — MAGNESIUM: Magnesium: 2 mg/dL (ref 1.7–2.4)

## 2019-11-24 MED ORDER — POTASSIUM CHLORIDE 10 MEQ/100ML IV SOLN
10.0000 meq | INTRAVENOUS | Status: DC
  Filled 2019-11-24: qty 100

## 2019-11-24 MED ORDER — SODIUM CHLORIDE 0.9% IV SOLUTION: Freq: Once | INTRAVENOUS | Status: AC

## 2019-11-24 MED ORDER — BOOST / RESOURCE BREEZE PO LIQD CUSTOM
1.0000 | Freq: Three times a day (TID) | ORAL | Status: DC
  Administered 2019-11-24 – 2019-11-25 (×3): 1 via ORAL

## 2019-11-24 MED ORDER — POTASSIUM CHLORIDE 20 MEQ/15ML (10%) PO SOLN
40.0000 meq | Freq: Two times a day (BID) | ORAL | Status: AC
  Administered 2019-11-24 (×2): 40 meq via ORAL
  Filled 2019-11-24 (×2): qty 30

## 2019-11-24 MED ORDER — DIPHENHYDRAMINE HCL 25 MG PO CAPS: 25.0000 mg | ORAL_CAPSULE | Freq: Once | ORAL | Status: DC

## 2019-11-24 MED ORDER — ACETAMINOPHEN 325 MG PO TABS
650.0000 mg | ORAL_TABLET | Freq: Once | ORAL | Status: AC
  Administered 2019-11-24: 650 mg via ORAL
  Filled 2019-11-24: qty 2

## 2019-11-24 NOTE — Progress Notes (Signed)
PROGRESS NOTE    [name]  JOI:[id number] DOB: 1933-06-18 DOA: 11/15/2019 PCP: [name], MD   Brief Narrative:  86 year old with history of essential hypertension, CAD status post CABG 2017, CVA, TIA, AAA, GI bleed, DM2, IBS, HLD, carotid stenosis, CKD stage III OA admitted for mechanical fall with right hip fracture requiring surgical repair on 11/16/2019 started on postop DVT prophylaxis Xarelto.  Hospital course complicated by postop anemia requiring 1 unit PRBC transfusion.  Also developed metabolic encephalopathy, CT head negative but MRI brain showed acute CVA, neurology consulted.  EEG was overall unremarkable.  Had elevated troponin therefore cardiology team consulted as well.  Due to patient being lethargic, extensive work-up for encephalopathy was performed which was negative.  Lyrica was tapered off.  Ended up growing MSSA bacteremia started on Ancef.   Assessment & Plan:   Principal Problem:   Closed right hip fracture (HCC) Active Problems:   GERD   Supine hypertension   PVD (peripheral vascular disease) (HCC)   Coronary artery disease involving coronary bypass graft of native heart without angina pectoris   Diabetes mellitus type 2, diet-controlled ([place])   CKD stage 3 due to type 2 diabetes mellitus (HCC)   Dysautonomia orthostatic hypotension syndrome (HCC)   Diabetic neuropathy (HCC)   History of lacunar cerebrovascular accident (CVA)   Elevated troponin   Stroke (cerebrum) (HCC)   Acute encephalopathy,  Acute medial left parietal lobe infarct, multifocal -Lyrica has been tapered off -CT head negative, MRI brain consistent with acute CVA-multifocal, unchanged bilateral papilledema, chronic microvascular ischemic changes in multiple areas of the brain, moderate cerebral atrophy -EEG-unremarkable, ammonia levels-unremarkable -Transcranial Dopplers-negative bubble study -Echocardiogram-EF 60 to 65%, moderate LVH, grade 2  DD -A1c-6.1 -LDL-59 -TSH-within normal limits -Ammonia-normal -Repeat CT head 7/23-negative for acute pathology -LP 7/23-negative for any evidence of infection  Fevers likely secondary to acute cystitis without hematuria MSSE bacteremia, suspect hip repair as a source -Keflex has been transitioned to Ancef.  Routine echo-no vegetations noted -Repeat surveillance cultures ordered, may end up requiring further work-up if surveillance cultures are positive. -Orthopedic team notified; Dr Erlinda Hong  Acute blood loss anemia postop -Initial transfusion 7/21.  Thereafter hemoglobin remained stable but this morning it dropped again.  Repeat another unit transfusion -Hold off on Xarelto-DVT prophylaxis -Orthopedic notified   Closed right hip fracture status post surgical repair 7/21 -Postop management per orthopedic.  Status post right hip IM nail.  Eventually will need to go to SNF.  Elevated troponin, resolved- -Likely demand ischemia.  Cardiology following -Echo-EF 47-09%, grade 2 diastolic dysfunction.  Sinus tachycardia -Improved with IV fluids  Autonomic dysfunction -Daily Florinef  Dysphagia -Speech team following.  Aspiration precautions.  CKD stage IIIA -creatinine around baseline of 1.4  History of CVA Currently on daily aspirin and Lipitor 80 mg daily.  Further recommendations by neurology team  Coronary artery disease status post CABG Currently chest pain-free.  Ambulatory dysfunction post right hip repair PT/OT recommending SNF    DVT prophylaxis: Daily Xarelto 10 mg-currently on hold Code Status: DNR Family Communication:   Status is: Inpatient  Remains inpatient appropriate because:Inpatient level of care appropriate due to severity of illness   Dispo:  Patient From: Home  Planned Disposition: [facility]  Expected discharge date: 1-2-day  Medically stable for discharge: No.  P.o. intake is still very inconsistent due to him being  encephalopathic and drowsy. Body mass index is 27.87 kg/m.   Subjective: Patient is still drowsy but wakes up easily.  Minimal interaction. Wife at besode   Review of Systems Otherwise negative except as per HPI, including: General = no fevers, chills, dizziness,  fatigue HEENT/EYES = negative for loss of vision, double vision, blurred vision,  sore throa Cardiovascular= negative for palpitation Respiratory/lungs= negative for shortness of breath, cough, wheezing; hemoptysis,  Gastrointestinal= negative for nausea, vomiting, abdominal pain Genitourinary= negative for Dysuria MSK = Negative for arthralgia, myalgias Neurology= Negative for headache, numbness, tingling  Psychiatry= Negative for suicidal and homocidal ideation Skin= Negative for Rash   Examination: Constitutional: Drowsy Respiratory: Clear to auscultation bilaterally Cardiovascular: Normal sinus rhythm, no rubs Abdomen: Nontender nondistended good bowel sounds Musculoskeletal: No edema noted Skin: Right hip dressing noted, appears to be slightly tense and mildly warm to touch. Neurologic: Drowsy difficult to assess but grossly moving all the extremities Psychiatric: Drowsy difficult to assess   Objective: Vitals:   11/23/19 1936 11/23/19 2007 11/24/19 0410 11/24/19 0608  BP: 118/84 (!) 161/94 (!) 146/86   Pulse: 65 51 92 94  Resp: 20 18 18    Temp: (!) 97.4 F (36.3 C) 98.5 F (36.9 C) (!) 101.4 F (38.6 C) 99.8 F (37.7 C)  TempSrc: Oral  Oral Oral  SpO2: 95% 98% 95%   Weight:      Height:        Intake/Output Summary (Last 24 hours) at 11/24/2019 0753 Last data filed at 11/24/2019 0033 Gross per 24 hour  Intake 1232.75 ml  Output 400 ml  Net 832.75 ml   Filed Weights   11/22/19 0532 11/22/19 2300 11/23/19 0206  Weight: (!) 92.2 kg 92 kg (!) 93.2 kg     Data Reviewed:   CBC: Recent Labs  Lab 11/19/19 0443 11/21/19 0501 11/22/19 0647 11/23/19 0704 11/24/19 0547  WBC 10.4 10.4 14.0*  12.5* 11.6*  HGB 8.2* 8.0* 7.9* 7.1* 5.6*  HCT 25.3* 24.8* 25.4* 22.6* 17.4*  MCV 99.2 99.2 99.2 98.7 99.4  PLT 174 226 254 262 035   Basic Metabolic Panel: Recent Labs  Lab 11/18/19 0116 11/21/19 0501 11/22/19 0647 11/23/19 0704 11/24/19 0547  NA 140 143 144 143 143  K 3.7 3.3* 3.2* 3.3* 3.1*  CL 107 108 108 109 110  CO2 26 26 27 25 26   GLUCOSE 119* 123* 140* 130* 149*  BUN 26* 25* 23 23 22   CREATININE 1.43* 1.30* 1.20 1.21 1.23  CALCIUM 7.9* 8.0* 8.4* 8.1* 8.0*  MG  --  1.9 2.0 1.9 2.0   GFR: Estimated Creatinine Clearance: 53 mL/min (by C-G formula based on SCr of 1.23 mg/dL). Liver Function Tests: Recent Labs  Lab 11/18/19 1445  AST 25  ALT 12  ALKPHOS 64  BILITOT 1.0  PROT 5.8*  ALBUMIN 2.7*   No results for input(s): LIPASE, AMYLASE in the last 168 hours. Recent Labs  Lab 11/18/19 1445 11/20/19 1413  AMMONIA 14 20   Coagulation Profile: No results for input(s): INR, PROTIME in the last 168 hours. Cardiac Enzymes: No results for input(s): CKTOTAL, CKMB, CKMBINDEX, TROPONINI in the last 168 hours. BNP (last 3 results) No results for input(s): PROBNP in the last 8760 hours. HbA1C: No results for input(s): HGBA1C in the last 72 hours. CBG: Recent Labs  Lab 11/23/19 1736 11/23/19 2000 11/24/19 0007 11/24/19 0406 11/24/19 0730  GLUCAP 134* 159* 145* 133* 149*   Lipid Profile: No results for input(s): CHOL, HDL, LDLCALC, TRIG, CHOLHDL, LDLDIRECT in the last 72 hours. Thyroid Function Tests: No results for input(s): TSH, T4TOTAL, FREET4, T3FREE, THYROIDAB in the  last 72 hours. Anemia Panel: No results for input(s): VITAMINB12, FOLATE, FERRITIN, TIBC, IRON, RETICCTPCT in the last 72 hours. Sepsis Labs: Recent Labs  Lab 11/19/19 0908 11/21/19 0909  PROCALCITON <0.10 0.10  LATICACIDVEN 0.9  --     Recent Results (from the past 240 hour(s))  SARS Coronavirus 2 by RT PCR (hospital order, performed in [facility] hospital lab) Nasopharyngeal  Nasopharyngeal Swab     Status: None   Collection Time: 11/15/19  3:30 PM   Specimen: Nasopharyngeal Swab  Result Value Ref Range Status   SARS Coronavirus 2 NEGATIVE NEGATIVE Final    Comment: (NOTE) SARS-CoV-2 target nucleic acids are NOT DETECTED.  The SARS-CoV-2 RNA is generally detectable in upper and lower respiratory specimens during the acute phase of infection. The lowest concentration of SARS-CoV-2 viral copies this assay can detect is 250 copies / mL. A negative result does not preclude SARS-CoV-2 infection and should not be used as the sole basis for treatment or other patient management decisions.  A negative result may occur with improper specimen collection / handling, submission of specimen other than nasopharyngeal swab, presence of viral mutation(s) within the areas targeted by this assay, and inadequate number of viral copies (<250 copies / mL). A negative result must be combined with clinical observations, patient history, and epidemiological information.  Fact Sheet for Patients:   StrictlyIdeas.no  Fact Sheet for Healthcare Providers: BankingDealers.co.za  This test is not yet approved or  cleared by the [place] FDA and has been authorized for detection and/or diagnosis of SARS-CoV-2 by FDA under an Emergency Use Authorization (EUA).  This EUA will remain in effect (meaning this test can be used) for the duration of the COVID-19 declaration under Section 564(b)(1) of the Act, 21 U.S.C. section 360bbb-3(b)(1), unless the authorization is terminated or revoked sooner.  Performed at [facility], [address], [place], [place] [address]   MRSA PCR Screening     Status: None   Collection Time: 11/16/19  1:03 AM   Specimen: Nasal Mucosa; Nasopharyngeal  Result Value Ref Range Status   MRSA by PCR NEGATIVE NEGATIVE Final    Comment:        The GeneXpert MRSA Assay (FDA approved for NASAL specimens only),  is one component of a comprehensive MRSA colonization surveillance program. It is not intended to diagnose MRSA infection nor to guide or monitor treatment for MRSA infections. Performed at [facility], [place] [address] [place], [place] [address]   Culture, blood (Routine X 2) w Reflex to ID Panel     Status: Abnormal   Collection Time: 11/21/19  9:09 AM   Specimen: BLOOD RIGHT HAND  Result Value Ref Range Status   Specimen Description BLOOD RIGHT HAND  Final   Special Requests   Final    BOTTLES DRAWN AEROBIC ONLY Blood Culture adequate volume   Culture  Setup Time   Final    GRAM POSITIVE COCCI IN CLUSTERS AEROBIC BOTTLE ONLY CRITICAL RESULT CALLED TO, READ BACK BY AND VERIFIED WITH: Sarajane Marek [phone]  Performed at [facility], [place] [address] [place], [place] [address]    Culture STAPHYLOCOCCUS EPIDERMIDIS (A)  Final   Report Status 11/24/2019 FINAL  Final   Organism ID, Bacteria STAPHYLOCOCCUS EPIDERMIDIS  Final      Susceptibility   Staphylococcus epidermidis - MIC*    CIPROFLOXACIN >=8 RESISTANT Resistant     ERYTHROMYCIN <=0.25 SENSITIVE Sensitive     GENTAMICIN <=0.5  SENSITIVE Sensitive     OXACILLIN <=0.25 SENSITIVE Sensitive     TETRACYCLINE <=1 SENSITIVE Sensitive     VANCOMYCIN 1 SENSITIVE Sensitive     TRIMETH/SULFA 160 RESISTANT Resistant     CLINDAMYCIN <=0.25 SENSITIVE Sensitive     RIFAMPIN <=0.5 SENSITIVE Sensitive     Inducible Clindamycin NEGATIVE Sensitive     * STAPHYLOCOCCUS EPIDERMIDIS  Blood Culture ID Panel (Reflexed)     Status: Abnormal   Collection Time: 11/21/19  9:09 AM  Result Value Ref Range Status   Enterococcus species NOT DETECTED NOT DETECTED Final   Listeria monocytogenes NOT DETECTED NOT DETECTED Final   Staphylococcus species DETECTED (A) NOT DETECTED Final    Comment: Methicillin (oxacillin) susceptible coagulase negative staphylococcus. Possible blood culture contaminant (unless isolated from more than one  blood culture draw or clinical case suggests pathogenicity). No antibiotic treatment is indicated for blood  culture contaminants. CRITICAL RESULT CALLED TO, READ BACK BY AND VERIFIED WITH: Sarajane Marek [id number] AT 1034 SK     Staphylococcus aureus (BCID) NOT DETECTED NOT DETECTED Final   Methicillin resistance NOT DETECTED NOT DETECTED Final   Streptococcus species NOT DETECTED NOT DETECTED Final   Streptococcus agalactiae NOT DETECTED NOT DETECTED Final   Streptococcus pneumoniae NOT DETECTED NOT DETECTED Final   Streptococcus pyogenes NOT DETECTED NOT DETECTED Final   Acinetobacter baumannii NOT DETECTED NOT DETECTED Final   Enterobacteriaceae species NOT DETECTED NOT DETECTED Final   Enterobacter cloacae complex NOT DETECTED NOT DETECTED Final   Escherichia coli NOT DETECTED NOT DETECTED Final   Klebsiella oxytoca NOT DETECTED NOT DETECTED Final   Klebsiella pneumoniae NOT DETECTED NOT DETECTED Final   Proteus species NOT DETECTED NOT DETECTED Final   Serratia marcescens NOT DETECTED NOT DETECTED Final   Haemophilus influenzae NOT DETECTED NOT DETECTED Final   Neisseria meningitidis NOT DETECTED NOT DETECTED Final   Pseudomonas aeruginosa NOT DETECTED NOT DETECTED Final   Candida albicans NOT DETECTED NOT DETECTED Final   Candida glabrata NOT DETECTED NOT DETECTED Final   Candida krusei NOT DETECTED NOT DETECTED Final   Candida parapsilosis NOT DETECTED NOT DETECTED Final   Candida tropicalis NOT DETECTED NOT DETECTED Final    Comment: Performed at [facility], [address] [place], [place] [address]  Culture, blood (Routine X 2) w Reflex to ID Panel     Status: Abnormal   Collection Time: 11/21/19  9:13 AM   Specimen: BLOOD LEFT HAND  Result Value Ref Range Status   Specimen Description BLOOD LEFT HAND  Final   Special Requests   Final    BOTTLES DRAWN AEROBIC ONLY Blood Culture results may not be optimal due to an inadequate volume of blood received in culture  bottles   Culture  Setup Time   Final    GRAM POSITIVE COCCI IN CLUSTERS AEROBIC BOTTLE ONLY CRITICAL VALUE NOTED.  VALUE IS CONSISTENT WITH PREVIOUSLY REPORTED AND CALLED VALUE.    Culture (A)  Final    STAPHYLOCOCCUS EPIDERMIDIS SUSCEPTIBILITIES PERFORMED ON PREVIOUS CULTURE WITHIN THE LAST 5 DAYS. Performed at [facility], [place] [address] [place], [place] [address]    Report Status 11/24/2019 FINAL  Final  CSF culture     Status: None (Preliminary result)   Collection Time: 11/22/19  5:15 PM   Specimen: PATH Cytology CSF; Cerebrospinal Fluid  Result Value Ref Range Status   Specimen Description CSF  Final   Special Requests NONE  Final   Gram Stain  Final    WBC PRESENT, PREDOMINANTLY MONONUCLEAR NO ORGANISMS SEEN CYTOSPIN SMEAR    Culture   Final    NO GROWTH < 24 HOURS Performed at [facility] [address] [place], [place] [address]    Report Status PENDING  Incomplete  Culture, fungus without smear     Status: None (Preliminary result)   Collection Time: 11/22/19  5:15 PM   Specimen: PATH Cytology CSF; Cerebrospinal Fluid  Result Value Ref Range Status   Specimen Description CSF  Final   Special Requests NONE  Final   Culture   Final    NO FUNGUS ISOLATED AFTER 1 DAY Performed at [facility], [place] [address] [place], [place] [address]    Report Status PENDING  Incomplete         Radiology Studies: CT HEAD WO CONTRAST  Result Date: 11/22/2019 CLINICAL DATA:  Altered mental status. EXAM: CT HEAD WITHOUT CONTRAST TECHNIQUE: Contiguous axial images were obtained from the base of the skull through the vertex without intravenous contrast. COMPARISON:  11/18/2019 FINDINGS: Brain: Stable moderately enlarged ventricles and subarachnoid spaces. Stable marked patchy white matter low density in both cerebral hemispheres. Stable old left basal ganglia and right external capsule and corona radiata infarcts. No intracranial hemorrhage, mass lesion or  CT evidence of acute infarction. Vascular: No hyperdense vessel or unexpected calcification. Skull: Normal. Negative for fracture or focal lesion. Sinuses/Orbits: Status post bilateral cataract extraction. Unremarkable bones and included paranasal sinuses. Other: None. IMPRESSION: 1. No acute abnormality. 2. Stable atrophy, chronic small vessel white matter ischemic changes and old infarcts. Electronically Signed   By: Claudie Revering M.D.   On: 11/22/2019 16:37   DG FLUORO GUIDE LUMBAR PUNCTURE  Result Date: 11/22/2019 CLINICAL DATA:  Encephalopathy. EXAM: DIAGNOSTIC LUMBAR PUNCTURE UNDER FLUOROSCOPIC GUIDANCE FLUOROSCOPY TIME:  Fluoroscopy Time:  24 seconds. Radiation Exposure Index (if provided by the fluoroscopic device): 12.8 mGy Number of Acquired Spot Images: 1 PROCEDURE: Informed consent was obtained from the patient's wife, Montae Stager via telephone prior to the procedure. This included a discussion of potential risks of the procedure including but not limited to headache, allergy, pain. With the patient prone, the lower back was prepped with Betadine. 1% Lidocaine was used for local anesthesia. Lumbar puncture was performed at the L3-L4 level using a 20 gauge needle with return of clear CSF. 13.5 ml of CSF were obtained for laboratory studies. The patient tolerated the procedure well and there were no apparent complications. IMPRESSION: Technically successful fluoroscopically guided lumbar puncture at the L3-L4 level. No immediate postprocedure complication. 13.5 mL of CSF were obtained and sent to the laboratory for analysis. Electronically Signed   By: Kellie Simmering DO   On: 11/22/2019 16:59        Scheduled Meds: . sodium chloride   Intravenous Once  . acetaminophen  650 mg Oral Once  . aspirin EC  81 mg Oral Daily  . atorvastatin  80 mg Oral Daily  . diphenhydrAMINE  25 mg Oral Once  . docusate sodium  100 mg Oral BID  . fludrocortisone  0.1 mg Oral Daily  . insulin aspart  0-9 Units  Subcutaneous Q4H  . pantoprazole  40 mg Oral Daily  . polyethylene glycol  17 g Oral Daily  . rivaroxaban  10 mg Oral Q supper   Continuous Infusions: . sodium chloride 50 mL/hr at 11/23/19 2122  .  ceFAZolin (ANCEF) IV 2 g (11/24/19 0630)  . potassium chloride  LOS: 9 days   Time spent= 35 mins    Cecil Bixby Arsenio Loader, MD [facility]  If 7PM-7AM, please contact night-coverage  11/24/2019, 7:53 AM

## 2019-11-24 NOTE — Progress Notes (Signed)
Patient's family has concerned about fever spikes that the patient has been having. Paged Orthopedics MD to get an update results on the CT scan. Patient's family updated.

## 2019-11-24 NOTE — Progress Notes (Signed)
ANTICOAGULATION CONSULT NOTE  Pharmacy Consult for Xarelto Indication: VTE prophylaxis after hip surgery  Allergies  Allergen Reactions  . Other Other (See Comments)    Pt reports allergic to a medication but unsure of name or type of med  . Cymbalta [Duloxetine Hcl] Other (See Comments)    Dizziness, hallucinations.   . Keflex [Cephalexin] Other (See Comments)    dizziness  . Simvastatin Other (See Comments)    Joint pain  . Sudafed [Pseudoephedrine] Other (See Comments)    Dizziness    Patient Measurements: Height: 6' (182.9 cm) Weight: (!) 93.2 kg (205 lb 7.5 oz) IBW/kg (Calculated) : 77.6  Vital Signs: Temp: 98.1 F (36.7 C) (07/25 1013) Temp Source: Oral (07/25 1013) BP: 151/75 (07/25 1013) Pulse Rate: 88 (07/25 1013)  Labs: Recent Labs    11/22/19 0647 11/22/19 0647 11/23/19 0704 11/24/19 0547  HGB 7.9*   < > 7.1* 5.6*  HCT 25.4*  --  22.6* 17.4*  PLT 254  --  262 355  CREATININE 1.20  --  1.21 1.23   < > = values in this interval not displayed.    Estimated Creatinine Clearance: 53 mL/min (by C-G formula based on SCr of 1.23 mg/dL).   Medications:  Scheduled:  . acetaminophen  650 mg Oral Once  . aspirin EC  81 mg Oral Daily  . atorvastatin  80 mg Oral Daily  . diphenhydrAMINE  25 mg Oral Once  . docusate sodium  100 mg Oral BID  . feeding supplement  1 Container Oral TID BM  . fludrocortisone  0.1 mg Oral Daily  . insulin aspart  0-9 Units Subcutaneous Q4H  . pantoprazole  40 mg Oral Daily  . polyethylene glycol  17 g Oral Daily  . potassium chloride  40 mEq Oral BID    Assessment: Xarelto on hold for anemia, s/p 1 unit RBC transfusion on 7/25 for Hgb 5.6  Goal of Therapy:  Monitor platelets by anticoagulation protocol: Yes   Plan:  Continue to monitor Hgb and restart date for [place], PharmD PGY1 Acute Care Pharmacy Resident Please refer to [facility] for unit-specific pharmacist

## 2019-11-24 NOTE — Progress Notes (Signed)
Hemoglobin 5.6 this am. MD paged. Awaiting orders.

## 2019-11-24 NOTE — Progress Notes (Signed)
   11/24/19 0410  Assess: MEWS Score  Temp (!) 101.4 F (38.6 C)  BP (!) 146/86  Pulse Rate 92  ECG Heart Rate 92  Resp 18  Level of Consciousness Alert  SpO2 95 %  O2 Device Nasal Cannula  O2 Flow Rate (L/min) 2 L/min  Assess: MEWS Score  MEWS Temp 1  MEWS Systolic 0  MEWS Pulse 0  MEWS RR 0  MEWS LOC 0  MEWS Score 1  MEWS Score Color Green  Assess: if the MEWS score is Yellow or Red  Were vital signs taken at a resting state? Yes  Focused Assessment No change from prior assessment  Early Detection of Sepsis Score *See Row Information* Medium  MEWS guidelines implemented *See Row Information* No, other (Comment)  Treat  MEWS Interventions Administered prn meds/treatments  Pain Scale Faces  Pain Score 0  Complains of Fever  Interventions Medication (see MAR)  Notify: Charge Nurse/RN  Name of Charge Nurse/RN Notified Fred, RN  Date Charge Nurse/RN Notified 11/24/19  Time Charge Nurse/RN Notified 0410  Notify: Provider  Provider Name/Title B. Chotiner  Date Provider Notified 11/24/19  Time Provider Notified 0600  Notification Type Page  Notification Reason Other (Comment) (Spiked temp. )  Response See new orders  Date of Provider Response 11/24/19  Time of Provider Response [phone]  Document  Patient Outcome Stabilized after interventions  Progress note created (see row info) Yes    Tylenol given. Current temp 99.8. MD aware.

## 2019-11-24 NOTE — Progress Notes (Signed)
Reviewed CT scan which is negative for hematoma or active bleeding.  No orthopedic intervention needed at this time.  Follow up with Dr. Marlou Sa as scheduled.  Azucena Cecil, MD [facility] [phone] 1:20 PM

## 2019-11-24 NOTE — Progress Notes (Signed)
Orthopedic Tech Progress Note Patient Details:  [name] 07/03/1933 [id number]  Patient ID: [name], male   DOB: 06/03/1933, 86 y.o.   MRN: [id number] Applied cpm.  Karolee Stamps 11/24/2019, 7:37 PM

## 2019-11-25 ENCOUNTER — Inpatient Hospital Stay (HOSPITAL_COMMUNITY): Payer: Medicare Other

## 2019-11-25 DIAGNOSIS — I10 Essential (primary) hypertension: Secondary | ICD-10-CM

## 2019-11-25 DIAGNOSIS — E1122 Type 2 diabetes mellitus with diabetic chronic kidney disease: Secondary | ICD-10-CM | POA: Diagnosis not present

## 2019-11-25 DIAGNOSIS — S72001G Fracture of unspecified part of neck of right femur, subsequent encounter for closed fracture with delayed healing: Secondary | ICD-10-CM | POA: Diagnosis not present

## 2019-11-25 DIAGNOSIS — E119 Type 2 diabetes mellitus without complications: Secondary | ICD-10-CM | POA: Diagnosis not present

## 2019-11-25 DIAGNOSIS — I25810 Atherosclerosis of coronary artery bypass graft(s) without angina pectoris: Secondary | ICD-10-CM | POA: Diagnosis not present

## 2019-11-25 LAB — CBC
HCT: 28.8 % — ABNORMAL LOW (ref 39.0–52.0)
Hemoglobin: 9.2 g/dL — ABNORMAL LOW (ref 13.0–17.0)
MCH: 29.9 pg (ref 26.0–34.0)
MCHC: 31.9 g/dL (ref 30.0–36.0)
MCV: 93.5 fL (ref 80.0–100.0)
Platelets: 342 10*3/uL (ref 150–400)
RBC: 3.08 MIL/uL — ABNORMAL LOW (ref 4.22–5.81)
RDW: 17.4 % — ABNORMAL HIGH (ref 11.5–15.5)
WBC: 15.1 10*3/uL — ABNORMAL HIGH (ref 4.0–10.5)
nRBC: 0 % (ref 0.0–0.2)

## 2019-11-25 LAB — TYPE AND SCREEN
ABO/RH(D): B NEG
Antibody Screen: NEGATIVE
Unit division: 0

## 2019-11-25 LAB — GLUCOSE, CAPILLARY
Glucose-Capillary: 146 mg/dL — ABNORMAL HIGH (ref 70–99)
Glucose-Capillary: 144 mg/dL — ABNORMAL HIGH (ref 70–99)
Glucose-Capillary: 154 mg/dL — ABNORMAL HIGH (ref 70–99)
Glucose-Capillary: 150 mg/dL — ABNORMAL HIGH (ref 70–99)
Glucose-Capillary: 188 mg/dL — ABNORMAL HIGH (ref 70–99)
Glucose-Capillary: 162 mg/dL — ABNORMAL HIGH (ref 70–99)

## 2019-11-25 LAB — BASIC METABOLIC PANEL
Anion gap: 9 (ref 5–15)
BUN: 23 mg/dL (ref 8–23)
CO2: 26 mmol/L (ref 22–32)
Calcium: 8.1 mg/dL — ABNORMAL LOW (ref 8.9–10.3)
Chloride: 110 mmol/L (ref 98–111)
Creatinine, Ser: 1.17 mg/dL (ref 0.61–1.24)
GFR calc Af Amer: >60 mL/min (ref >60)
GFR calc non Af Amer: 57 mL/min — ABNORMAL LOW (ref >60)
Glucose, Bld: 147 mg/dL — ABNORMAL HIGH (ref 70–99)
Potassium: 3.2 mmol/L — ABNORMAL LOW (ref 3.5–5.1)
Sodium: 145 mmol/L (ref 135–145)

## 2019-11-25 LAB — BPAM RBC
Blood Product Expiration Date: 202108032359
ISSUE DATE / TIME: 202107251632
Unit Type and Rh: 1700

## 2019-11-25 LAB — CYTOLOGY - NON PAP

## 2019-11-25 LAB — MAGNESIUM: Magnesium: 2.1 mg/dL (ref 1.7–2.4)

## 2019-11-25 MED ORDER — ENSURE ENLIVE PO LIQD
237.0000 mL | Freq: Two times a day (BID) | ORAL | Status: DC
  Administered 2019-11-26 – 2019-11-27 (×3): 237 mL via ORAL

## 2019-11-25 MED ORDER — POTASSIUM CHLORIDE 20 MEQ/15ML (10%) PO SOLN
40.0000 meq | ORAL | Status: AC
  Administered 2019-11-25 (×2): 40 meq via ORAL
  Filled 2019-11-25 (×2): qty 30

## 2019-11-25 MED ORDER — ADULT MULTIVITAMIN W/MINERALS CH
1.0000 | ORAL_TABLET | Freq: Every day | ORAL | Status: DC
  Administered 2019-11-25 – 2019-11-30 (×6): 1 via ORAL
  Filled 2019-11-25 (×6): qty 1

## 2019-11-25 NOTE — TOC Progression Note (Addendum)
Transition of Care [facility]) - Progression Note    Patient Details  Name: [name] MRN: [id number] Date of Birth: 1933/11/10  [facility] CM/SW [place], [place] Phone Number: 11/25/2019, 8:47 AM  Clinical Narrative:     CSW spoke with [facility], confirmed bed availability once medically stable. [employer] authorization expired today. [place] requested a new insurance authorization be started reference #[id number].   Barriers to Discharge: Continued Medical Work up  Expected Discharge Plan and Services         Living arrangements for the past 2 months: Single Family Home                                       Social Determinants of Health (SDOH) Interventions    Readmission Risk Interventions No flowsheet data found.

## 2019-11-25 NOTE — Progress Notes (Signed)
PROGRESS NOTE    [name]  JJK:[id number] DOB: [birth year] DOA: 11/15/2019 PCP: [name], MD   Brief Narrative:  86 year old with history of essential hypertension, CAD status post CABG 2017, CVA, TIA, AAA, GI bleed, DM2, IBS, HLD, carotid stenosis, CKD stage III OA admitted for mechanical fall with right hip fracture requiring surgical repair on 11/16/2019 started on postop DVT prophylaxis Xarelto.  Hospital course complicated by postop anemia requiring 1 unit PRBC transfusion.  Also developed metabolic encephalopathy, CT head negative but MRI brain showed acute CVA, neurology consulted.  EEG was overall unremarkable.  Had elevated troponin therefore cardiology team consulted as well.  Due to patient being lethargic, extensive work-up for encephalopathy was performed which was negative.  Lyrica was tapered off.  Ended up growing MSSA bacteremia started on Ancef.   Assessment & Plan:   Principal Problem:   Closed right hip fracture (HCC) Active Problems:   GERD   Supine hypertension   PVD (peripheral vascular disease) ([place])   Coronary artery disease involving coronary bypass graft of native heart without angina pectoris   Diabetes mellitus type 2, diet-controlled ([place])   CKD stage 3 due to type 2 diabetes mellitus (HCC)   Dysautonomia orthostatic hypotension syndrome (HCC)   Diabetic neuropathy (HCC)   History of lacunar cerebrovascular accident (CVA)   Elevated troponin   Stroke (cerebrum) (HCC)   MSSA bacteremia   Acute encephalopathy, slow to improve.  Easily arousable.  Exam is nonfocal. Acute medial left parietal lobe infarct, multifocal -Lyrica has been tapered off -CT head negative, MRI brain consistent with acute CVA-multifocal, unchanged bilateral papilledema, chronic microvascular ischemic changes in multiple areas of the brain, moderate cerebral atrophy -EEG-unremarkable, ammonia levels-unremarkable -Transcranial Dopplers-negative bubble  study -Echocardiogram-EF 60 to 65%, moderate LVH, grade 2 DD -A1c-6.1 -LDL-59 -TSH-within normal limits -Ammonia-normal -Repeat CT head 7/23-negative for acute pathology -LP 7/23-negative for any evidence of infection  Fevers likely secondary to acute cystitis without hematuria MSSE bacteremia, suspect hip repair as a source -Keflex has been transitioned to Ancef.  Routine echo-no vegetations noted -Repeat surveillance cultures-no growth to date -Seen by orthopedic team. -CT of the pelvis negative for any acute pathology.  Abdominal distention with some nausea -We will obtain abdominal x-ray to rule out any ileus or obstruction. -Out of bed to chair  Acute blood loss anemia postop -Initial transfusion 7/21.  Thereafter hemoglobin remained stable but this morning it dropped again.  Repeat another unit transfusion -Hold off on Xarelto-DVT prophylaxis -Seen by orthopedic, Dr Erlinda Hong   Closed right hip fracture status post surgical repair 7/21 -Postop management per orthopedic.  Status post right hip IM nail.  Eventually will need to go to SNF.  Elevated troponin, resolved- -Likely demand ischemia.  Cardiology following -Echo-EF 37-16%, grade 2 diastolic dysfunction.  Sinus tachycardia -Improved with IV fluids  Autonomic dysfunction -Daily Florinef  Dysphagia -Speech team following.  Aspiration precautions.  Would eventually benefit from modified barium swallow  CKD stage IIIA -creatinine around baseline of 1.4  History of CVA Currently on daily aspirin and Lipitor 80 mg daily.  Further recommendations by neurology team  Coronary artery disease status post CABG Currently chest pain-free.  Ambulatory dysfunction post right hip repair PT/OT recommending SNF    DVT prophylaxis: Daily Xarelto 10 mg-currently on hold Code Status: DNR Family Communication: Wife at bedside  Status is: Inpatient  Remains inpatient appropriate because:Inpatient level of care  appropriate due to severity of illness   Dispo:  Patient From: Home  Planned Disposition: [facility]  Expected discharge date: 1-2-day  Medically stable for discharge: No.  Still little drowsy but improved compared to yesterday.  Ongoing hospital stay for IV antibiotics and to ensure his bacteremia clears up.  In the meantime also undergoing swallow evaluation. Body mass index is 27.94 kg/m.   Subjective: Wife at bedside, patient is awake and answers basic questions.  His p.o. intake still remains very poor.  Review of Systems Otherwise negative except as per HPI, including: General: Denies fever, chills, night sweats or unintended weight loss. Resp: Denies cough, wheezing, shortness of breath. Cardiac: Denies chest pain, palpitations, orthopnea, paroxysmal nocturnal dyspnea. GI: Denies , vomiting, diarrhea or constipation GU: Denies dysuria, frequency, hesitancy or incontinence MS: Denies muscle aches, joint pain or swelling Neuro: Denies headache, neurologic deficits (focal weakness, numbness, tingling), abnormal gait Psych: Denies anxiety, depression, SI/HI/AVH Skin: Denies new rashes or lesions ID: Denies sick contacts, exotic exposures, travel  Examination: Constitutional: Not in acute distress slightly drowsy but easily arousable no complaints Respiratory: Clear to auscultation bilaterally Cardiovascular: Normal sinus rhythm, no rubs Abdomen: Abdomen mildly distended tender to deep palpation.  Positive bowel sounds. Musculoskeletal: No edema noted Skin: Right hip surgical site skin appears to be slightly tense and tender to touch, mild superficial ecchymosis but no active bleeding. Neurologic: CN 2-12 grossly intact.  And nonfocal Psychiatric: Poor judgment and insight. Alert and oriented x 3. Normal mood.  Objective: Vitals:   11/24/19 1702 11/24/19 1949 11/24/19 2100 11/25/19 0404  BP: (!) 129/72 (!) 141/83 (!) 172/88 (!) 156/86  Pulse: 79 84 91 98   Resp: 18 20 20 18   Temp: 98.8 F (37.1 C) 98.8 F (37.1 C) 99.3 F (37.4 C) 98 F (36.7 C)  TempSrc: Oral Oral Oral Oral  SpO2: 99% 97% 99% 98%  Weight:    (!) 93.4 kg  Height:        Intake/Output Summary (Last 24 hours) at 11/25/2019 1053 Last data filed at 11/25/2019 0845 Gross per 24 hour  Intake 240 ml  Output 850 ml  Net -610 ml   Filed Weights   11/22/19 2300 11/23/19 0206 11/25/19 0404  Weight: 92 kg (!) 93.2 kg (!) 93.4 kg     Data Reviewed:   CBC: Recent Labs  Lab 11/21/19 0501 11/21/19 0501 11/22/19 0647 11/23/19 0704 11/24/19 0547 11/24/19 2115 11/25/19 0454  WBC 10.4  --  14.0* 12.5* 11.6*  --  15.1*  HGB 8.0*   < > 7.9* 7.1* 5.6* 9.7* 9.2*  HCT 24.8*   < > 25.4* 22.6* 17.4* 30.8* 28.8*  MCV 99.2  --  99.2 98.7 99.4  --  93.5  PLT 226  --  254 262 355  --  342   < > = values in this interval not displayed.   Basic Metabolic Panel: Recent Labs  Lab 11/21/19 0501 11/22/19 0647 11/23/19 0704 11/24/19 0547 11/25/19 0454  NA 143 144 143 143 145  K 3.3* 3.2* 3.3* 3.1* 3.2*  CL 108 108 109 110 110  CO2 26 27 25 26 26   GLUCOSE 123* 140* 130* 149* 147*  BUN 25* 23 23 22 23   CREATININE 1.30* 1.20 1.21 1.23 1.17  CALCIUM 8.0* 8.4* 8.1* 8.0* 8.1*  MG 1.9 2.0 1.9 2.0 2.1   GFR: Estimated Creatinine Clearance: 55.8 mL/min (by C-G formula based on SCr of 1.17 mg/dL). Liver Function Tests: Recent Labs  Lab 11/18/19 1445  AST 25  ALT 12  ALKPHOS 64  BILITOT  1.0  PROT 5.8*  ALBUMIN 2.7*   No results for input(s): LIPASE, AMYLASE in the last 168 hours. Recent Labs  Lab 11/18/19 1445 11/20/19 1413  AMMONIA 14 20   Coagulation Profile: No results for input(s): INR, PROTIME in the last 168 hours. Cardiac Enzymes: No results for input(s): CKTOTAL, CKMB, CKMBINDEX, TROPONINI in the last 168 hours. BNP (last 3 results) No results for input(s): PROBNP in the last 8760 hours. HbA1C: No results for input(s): HGBA1C in the last 72  hours. CBG: Recent Labs  Lab 11/24/19 1618 11/24/19 1955 11/25/19 0000 11/25/19 0353 11/25/19 0722  GLUCAP 157* 162* 146* 144* 154*   Lipid Profile: No results for input(s): CHOL, HDL, LDLCALC, TRIG, CHOLHDL, LDLDIRECT in the last 72 hours. Thyroid Function Tests: No results for input(s): TSH, T4TOTAL, FREET4, T3FREE, THYROIDAB in the last 72 hours. Anemia Panel: No results for input(s): VITAMINB12, FOLATE, FERRITIN, TIBC, IRON, RETICCTPCT in the last 72 hours. Sepsis Labs: Recent Labs  Lab 11/19/19 0908 11/21/19 0909  PROCALCITON <0.10 0.10  LATICACIDVEN 0.9  --     Recent Results (from the past 240 hour(s))  SARS Coronavirus 2 by RT PCR (hospital order, performed in [facility] hospital lab) Nasopharyngeal Nasopharyngeal Swab     Status: None   Collection Time: 11/15/19  3:30 PM   Specimen: Nasopharyngeal Swab  Result Value Ref Range Status   SARS Coronavirus 2 NEGATIVE NEGATIVE Final    Comment: (NOTE) SARS-CoV-2 target nucleic acids are NOT DETECTED.  The SARS-CoV-2 RNA is generally detectable in upper and lower respiratory specimens during the acute phase of infection. The lowest concentration of SARS-CoV-2 viral copies this assay can detect is 250 copies / mL. A negative result does not preclude SARS-CoV-2 infection and should not be used as the sole basis for treatment or other patient management decisions.  A negative result may occur with improper specimen collection / handling, submission of specimen other than nasopharyngeal swab, presence of viral mutation(s) within the areas targeted by this assay, and inadequate number of viral copies (<250 copies / mL). A negative result must be combined with clinical observations, patient history, and epidemiological information.  Fact Sheet for Patients:   StrictlyIdeas.no  Fact Sheet for Healthcare Providers: BankingDealers.co.za  This test is not yet approved or   cleared by the [place] FDA and has been authorized for detection and/or diagnosis of SARS-CoV-2 by FDA under an Emergency Use Authorization (EUA).  This EUA will remain in effect (meaning this test can be used) for the duration of the COVID-19 declaration under Section 564(b)(1) of the Act, 21 U.S.C. section 360bbb-3(b)(1), unless the authorization is terminated or revoked sooner.  Performed at [facility], [address], [place], [place] [address]   MRSA PCR Screening     Status: None   Collection Time: 11/16/19  1:03 AM   Specimen: Nasal Mucosa; Nasopharyngeal  Result Value Ref Range Status   MRSA by PCR NEGATIVE NEGATIVE Final    Comment:        The GeneXpert MRSA Assay (FDA approved for NASAL specimens only), is one component of a comprehensive MRSA colonization surveillance program. It is not intended to diagnose MRSA infection nor to guide or monitor treatment for MRSA infections. Performed at [facility], [place] [address] [place],  [address]   Culture, blood (Routine X 2) w Reflex to ID Panel     Status: Abnormal   Collection Time: 11/21/19  9:09 AM   Specimen: BLOOD RIGHT HAND  Result Value Ref Range Status   Specimen Description BLOOD RIGHT HAND  Final   Special Requests   Final    BOTTLES DRAWN AEROBIC ONLY Blood Culture adequate volume   Culture  Setup Time   Final    GRAM POSITIVE COCCI IN CLUSTERS AEROBIC BOTTLE ONLY CRITICAL RESULT CALLED TO, READ BACK BY AND VERIFIED WITH: Sarajane Marek [phone]  Performed at [facility], [address], [place], [place] [address]    Culture STAPHYLOCOCCUS EPIDERMIDIS (A)  Final   Report Status 11/24/2019 FINAL  Final   Organism ID, Bacteria STAPHYLOCOCCUS EPIDERMIDIS  Final      Susceptibility   Staphylococcus epidermidis - MIC*    CIPROFLOXACIN >=8 RESISTANT Resistant     ERYTHROMYCIN <=0.25 SENSITIVE Sensitive     GENTAMICIN <=0.5 SENSITIVE Sensitive     OXACILLIN <=0.25 SENSITIVE  Sensitive     TETRACYCLINE <=1 SENSITIVE Sensitive     VANCOMYCIN 1 SENSITIVE Sensitive     TRIMETH/SULFA 160 RESISTANT Resistant     CLINDAMYCIN <=0.25 SENSITIVE Sensitive     RIFAMPIN <=0.5 SENSITIVE Sensitive     Inducible Clindamycin NEGATIVE Sensitive     * STAPHYLOCOCCUS EPIDERMIDIS  Blood Culture ID Panel (Reflexed)     Status: Abnormal   Collection Time: 11/21/19  9:09 AM  Result Value Ref Range Status   Enterococcus species NOT DETECTED NOT DETECTED Final   Listeria monocytogenes NOT DETECTED NOT DETECTED Final   Staphylococcus species DETECTED (A) NOT DETECTED Final    Comment: Methicillin (oxacillin) susceptible coagulase negative staphylococcus. Possible blood culture contaminant (unless isolated from more than one blood culture draw or clinical case suggests pathogenicity). No antibiotic treatment is indicated for blood  culture contaminants. CRITICAL RESULT CALLED TO, READ BACK BY AND VERIFIED WITH: Sarajane Marek [id number] 1034 SK     Staphylococcus aureus (BCID) NOT DETECTED NOT DETECTED Final   Methicillin resistance NOT DETECTED NOT DETECTED Final   Streptococcus species NOT DETECTED NOT DETECTED Final   Streptococcus agalactiae NOT DETECTED NOT DETECTED Final   Streptococcus pneumoniae NOT DETECTED NOT DETECTED Final   Streptococcus pyogenes NOT DETECTED NOT DETECTED Final   Acinetobacter baumannii NOT DETECTED NOT DETECTED Final   Enterobacteriaceae species NOT DETECTED NOT DETECTED Final   Enterobacter cloacae complex NOT DETECTED NOT DETECTED Final   Escherichia coli NOT DETECTED NOT DETECTED Final   Klebsiella oxytoca NOT DETECTED NOT DETECTED Final   Klebsiella pneumoniae NOT DETECTED NOT DETECTED Final   Proteus species NOT DETECTED NOT DETECTED Final   Serratia marcescens NOT DETECTED NOT DETECTED Final   Haemophilus influenzae NOT DETECTED NOT DETECTED Final   Neisseria meningitidis NOT DETECTED NOT DETECTED Final   Pseudomonas aeruginosa NOT DETECTED NOT  DETECTED Final   Candida albicans NOT DETECTED NOT DETECTED Final   Candida glabrata NOT DETECTED NOT DETECTED Final   Candida krusei NOT DETECTED NOT DETECTED Final   Candida parapsilosis NOT DETECTED NOT DETECTED Final   Candida tropicalis NOT DETECTED NOT DETECTED Final    Comment: Performed at [facility], [address], [place], [place] [address]  Culture, blood (Routine X 2) w Reflex to ID Panel     Status: Abnormal   Collection Time: 11/21/19  9:13 AM   Specimen: BLOOD LEFT HAND  Result Value Ref Range Status   Specimen Description BLOOD LEFT HAND  Final   Special Requests   Final    BOTTLES DRAWN AEROBIC ONLY Blood Culture results may not  be optimal due to an inadequate volume of blood received in culture bottles   Culture  Setup Time   Final    GRAM POSITIVE COCCI IN CLUSTERS AEROBIC BOTTLE ONLY CRITICAL VALUE NOTED.  VALUE IS CONSISTENT WITH PREVIOUSLY REPORTED AND CALLED VALUE.    Culture (A)  Final    STAPHYLOCOCCUS EPIDERMIDIS SUSCEPTIBILITIES PERFORMED ON PREVIOUS CULTURE WITHIN THE LAST 5 DAYS. Performed at [facility], [place] [address] [place], [place] [address]    Report Status 11/24/2019 FINAL  Final  Anaerobic culture     Status: None (Preliminary result)   Collection Time: 11/22/19  5:15 PM   Specimen: PATH Cytology CSF; Cerebrospinal Fluid  Result Value Ref Range Status   Specimen Description CSF  Final   Special Requests   Final    NONE Performed at [facility], [place] [address], [place], [place] [address]    Culture   Final    NO ANAEROBES ISOLATED; CULTURE IN PROGRESS FOR 5 DAYS   Report Status PENDING  Incomplete  CSF culture     Status: None (Preliminary result)   Collection Time: 11/22/19  5:15 PM   Specimen: PATH Cytology CSF; Cerebrospinal Fluid  Result Value Ref Range Status   Specimen Description CSF  Final   Special Requests NONE  Final   Gram Stain   Final    WBC PRESENT, PREDOMINANTLY MONONUCLEAR NO ORGANISMS  SEEN CYTOSPIN SMEAR    Culture   Final    NO GROWTH 3 DAYS Performed at [facility], [place] [address], [place], [place] [address]    Report Status PENDING  Incomplete  Culture, fungus without smear     Status: None (Preliminary result)   Collection Time: 11/22/19  5:15 PM   Specimen: PATH Cytology CSF; Cerebrospinal Fluid  Result Value Ref Range Status   Specimen Description CSF  Final   Special Requests NONE  Final   Culture   Final    NO FUNGUS ISOLATED AFTER 2 DAYS Performed at [facility], [place] [address], [place], [place] [address]    Report Status PENDING  Incomplete  Culture, blood (Routine X 2) w Reflex to ID Panel     Status: None (Preliminary result)   Collection Time: 11/24/19  5:45 AM   Specimen: BLOOD  Result Value Ref Range Status   Specimen Description BLOOD RIGHT ANTECUBITAL  Final   Special Requests   Final    BOTTLES DRAWN AEROBIC AND ANAEROBIC Blood Culture adequate volume   Culture   Final    NO GROWTH 1 DAY Performed at [facility], [place] [address] [place], [place] [address]    Report Status PENDING  Incomplete  Culture, blood (Routine X 2) w Reflex to ID Panel     Status: None (Preliminary result)   Collection Time: 11/24/19  5:46 AM   Specimen: BLOOD LEFT HAND  Result Value Ref Range Status   Specimen Description BLOOD LEFT HAND  Final   Special Requests   Final    BOTTLES DRAWN AEROBIC AND ANAEROBIC Blood Culture adequate volume   Culture   Final    NO GROWTH 1 DAY Performed at [facility], [place] [address] [place], [place] [address]    Report Status PENDING  Incomplete         Radiology Studies: CT PELVIS WO CONTRAST  Result Date: 11/24/2019 CLINICAL DATA:  Pelvic fracture, recent hip surgery. EXAM: CT PELVIS WITHOUT CONTRAST TECHNIQUE: Multidetector  CT imaging of the pelvis was performed following the standard protocol without intravenous contrast. COMPARISON:  Plain films 11/15/2019 and CT 09/11/2019 FINDINGS:  Urinary Tract:  No abnormality visualized. Bowel:  Unremarkable visualized pelvic bowel loops. Vascular/Lymphatic: Calcified plaque over the abdominal aorta and iliac arteries. Aneurysmal dilatation of the visualized infrarenal abdominal aorta measuring 4.4 cm in AP diameter. Dilatation of the common iliac arteries measuring 2.7 cm on the left and 2.9 cm on the right unchanged. Reproductive:  No mass or other significant abnormality Other: No free fluid. Subcutaneous edema over the lateral soft tissues of the right hip and right upper leg. Musculoskeletal: Known subacute healing right inferior pubic ramus fracture. Known right femoral intertrochanteric fracture post fixation with intramedullary nail and associated screw bridging the femoral neck into the femoral head intact. Near anatomic alignment over the fracture site. Stable displaced right lesser trochanter fragment. Stable subacute right sacral alar fracture. No new fractures identified. Remainder the exam is unchanged. IMPRESSION: 1. Stable right femoral trochanteric fracture post fixation with hardware intact and near anatomic alignment over the fracture site. 2.  Stable known subacute right inferior pubic ramus fracture. 3.  Stable subacute right sacral alar fracture. 4. 4.4 cm infrarenal abdominal aortic aneurysm. Recommend followup by ultrasound in 1 year. This recommendation follows ACR consensus guidelines: White Paper of the ACR Incidental Findings Committee II on Vascular Findings. J Am Coll Radiol 2013; 10:789-794. Aortic aneurysm NOS (ICD10-I71.9). Aortic aneurysm NOS (ICD10-I71.9). Aortic Atherosclerosis (ICD10-I70.0). Electronically Signed   By: Marin Olp M.D.   On: 11/24/2019 11:03        Scheduled Meds: . aspirin EC  81 mg Oral Daily  . atorvastatin  80 mg Oral Daily  . docusate sodium  100 mg Oral BID  . feeding supplement  1 Container Oral TID BM  . fludrocortisone  0.1 mg Oral Daily  . insulin aspart  0-9 Units Subcutaneous  Q4H  . pantoprazole  40 mg Oral Daily  . polyethylene glycol  17 g Oral Daily  . potassium chloride  40 mEq Oral Q4H   Continuous Infusions: . sodium chloride 50 mL/hr at 11/25/19 0422  .  ceFAZolin (ANCEF) IV 2 g (11/25/19 0527)     LOS: 10 days   Time spent= 35 mins    Tyrihanna Wingert Arsenio Loader, MD [facility]  If 7PM-7AM, please contact night-coverage  11/25/2019, 10:53 AM

## 2019-11-25 NOTE — Progress Notes (Signed)
  Speech Language Pathology Treatment: Dysphagia  Patient Details Name: [name] MRN: [id number] DOB: Nov 03, 1933 Today's Date: 11/25/2019 Time: 6659-9357 SLP Time Calculation (min) (ACUTE ONLY): 25 min  Assessment / Plan / Recommendation Clinical Impression  Pt seen at bedside for assessment of diet tolerance and continued education. Wife, dietician and nursing present during lunch. Pt has been requiring a significant amount of time to consume his meals, which increases risk for fatigue and associated higher risk of aspiration. No difficulty with Dys 3 textures, however, this may be too advanced from an energy standpoint. Pt was given small sips of honey thick liquid via straw and did not exhibit overt s/s aspiration. However, pt did exhibit silent aspiration of nectar thick liquid per MBS 11/19/19.   Will downgrade to dys 2 to facilitate timely po intake and reduce aspiration/fatigue risk. Continue honey thick liquids with straw use ok - encouraged family and nursing to assist pt with taking small sips. Safe swallow precautions updated. SLP will continue to follow.   HPI HPI:  86 y.o. male with a history of diet-controlled type 2 diabetes with stage III chronic kidney disease and diabetic neuropathy, coronary artery disease status post bypass, CVA.  Patient presents with right hip fx after a mechanical fall.  Underwent right hip IM nail placement 7/17. Now with acute metabolic encephalopathy.  [name] present, she reports hx of increasing swallowing difficulty in the last several months, with coughing/choking during meals.       SLP Plan  Goals updated       Recommendations  Diet recommendations: Dysphagia 2 (fine chop);Honey-thick liquid Liquids provided via: Cup;Straw;Teaspoon Medication Administration: Whole meds with puree Supervision: Full supervision/cueing for compensatory strategies;Staff to assist with self feeding Compensations: Slow rate;Small sips/bites Postural  Changes and/or Swallow Maneuvers: Seated upright 90 degrees;Upright 30-60 min after meal                Oral Care Recommendations: Staff/trained caregiver to provide oral care;Oral care QID Follow up Recommendations: Skilled Nursing facility;24 hour supervision/assistance SLP Visit Diagnosis: Dysphagia, oropharyngeal phase (R13.12) Plan: Goals updated       Acton B. Quentin Ore, [facility], [place] Speech Language Pathologist Office: [phone]  Shonna Chock 11/25/2019, 12:49 PM

## 2019-11-25 NOTE — Care Management Important Message (Signed)
Important Message  Patient Details  Name: MAKAIO MACH MRN: [id number] Date of Birth: [birth year]   Medicare Important Message Given:  Yes     Shelda Altes 11/25/2019, 8:52 AM

## 2019-11-25 NOTE — Progress Notes (Signed)
Orthopedic Tech Progress Note Patient Details:  [name] Mar 10, 1934 [id number] Just took patient off of CPm. Did very well Patient ID: [name], male   DOB: Jan 01, 1934, 86 y.o.   MRN: [id number]   Janit Pagan 11/25/2019, 5:17 PM

## 2019-11-25 NOTE — Progress Notes (Signed)
Physical Therapy Treatment Patient Details Name: [name] MRN: [id number] DOB: Jul 08, 1933 Today's Date: 11/25/2019    History of Present Illness Pt is a 86 y.o. male with a history of diet-controlled type 2 diabetes with stage III chronic kidney disease and diabetic neuropathy, coronary artery disease status post bypass, CVA.  Pt presents with a fall and subsequent right hip intertrochanteric fracture. pt had IMHS nailing 11/16/2019. Pt developed metabolic encephalopathy and acute cystitis. imaging also noted pt to have acute L parietal lobe infarct.     PT Comments    Pt is continuing to progress towards goals. Pt was noted to have difficulty with simple command following and required increased time. Pt was initially lethargic but showed increase in alertness with mobility. Pt was +2 max to total assist with bed mobility and required multimodal cueing for assist. Pt required +2 max assist with sitting balance with UE and LE support. Current recommendations are appropriate. Pt will continue to benefit from acute therapy to return patient to more functional level of independence. PT will continue to follow acutely.    Follow Up Recommendations  SNF     Equipment Recommendations  None recommended by PT (TBD in next venue)    Recommendations for Other Services       Precautions / Restrictions Precautions Precautions: Fall Restrictions Weight Bearing Restrictions: Yes RLE Weight Bearing: Non weight bearing Other Position/Activity Restrictions: For transfers only    Mobility  Bed Mobility Overal bed mobility: Needs Assistance Bed Mobility: Sit to Supine;Rolling;Supine to Sit Rolling: Max assist;+2 for physical assistance   Supine to sit: +2 for physical assistance;+2 for safety/equipment;HOB elevated;Total assist (helicopter transfer technique)   Sit to sidelying: Max assist;+2 for physical assistance;+2 for safety/equipment General bed mobility comments: pt unable to  maintain sidelying due to increase in pain in R hip with roll to sidelying and required mutlimodal cueing and max assist +2. pt was total assit +2 with supine to sit and sit to supine  Transfers                 General transfer comment: deferred transfer to chair due to his alertness and inability to stand with TDWB  Ambulation/Gait             General Gait Details: deferred, unable   Stairs             Wheelchair Mobility    Modified Rankin (Stroke Patients Only) Modified Rankin (Stroke Patients Only) Pre-Morbid Rankin Score: No symptoms Modified Rankin: Severe disability     Balance Overall balance assessment: Needs assistance Sitting-balance support: Feet supported;Bilateral upper extremity supported Sitting balance-Leahy Scale: Poor Sitting balance - Comments: pt requires +2 max assist with sitting balance; however, pt had moments of min to mod assist. Pt was relaint on UE support on bed or HHA , pt with noted left lateral lean and was cued to keep head upright Postural control: Left lateral lean                                  Cognition Arousal/Alertness: Awake/alert;Lethargic Behavior During Therapy: Flat affect Overall Cognitive Status: Impaired/Different from baseline Area of Impairment: Awareness;Problem solving;Following commands                       Following Commands: Follows one step commands inconsistently;Follows one step commands with increased time   Awareness: Intellectual Problem Solving: Slow processing;Decreased  initiation;Difficulty sequencing;Requires verbal cues;Requires tactile cues General Comments: pt was drowsy and lethargic during session. pt became more alert/awake when sitting at EOB. Pt had difficulty with following simple commands with increased time. Pt required mutlimodal cueing and max to total assist for bed mobility      Exercises Other Exercises Other Exercises: finding midline stability x  2-3 with min to mod assist Other Exercises: sitting balance with anterior/posterior wt shifting x 8 ea with max assist Other Exercises: sidelying propping x 6 ea side with total assist    General Comments General comments (skin integrity, edema, etc.): pt had dizziness noted in sitting, BP (144/89)      Pertinent Vitals/Pain Pain Assessment: Faces Faces Pain Scale: Hurts little more Pain Location: R hip Pain Descriptors / Indicators: Guarding Pain Intervention(s): Limited activity within patient's tolerance;Monitored during session;Repositioned    Home Living                      Prior Function            PT Goals (current goals can now be found in the care plan section) Acute Rehab PT Goals Patient Stated Goal: none stated PT Goal Formulation: With patient/family Time For Goal Achievement: 12/01/19 Potential to Achieve Goals: Fair Progress towards PT goals: Progressing toward goals    Frequency    Min 3X/week      PT Plan Current plan remains appropriate    Co-evaluation              AM-PAC PT "6 Clicks" Mobility   Outcome Measure  Help needed turning from your back to your side while in a flat bed without using bedrails?: Total Help needed moving from lying on your back to sitting on the side of a flat bed without using bedrails?: Total Help needed moving to and from a bed to a chair (including a wheelchair)?: Total Help needed standing up from a chair using your arms (e.g., wheelchair or bedside chair)?: Total Help needed to walk in hospital room?: Total Help needed climbing 3-5 steps with a railing? : Total 6 Click Score: 6    End of Session   Activity Tolerance: Patient limited by fatigue;Patient limited by lethargy;Patient limited by pain Patient left: in bed;with call Danisa Kopec/phone within reach;with family/visitor present;with bed alarm set Nurse Communication: Mobility status;Patient requests pain meds (nausea medications) PT Visit  Diagnosis: Pain;Other abnormalities of gait and mobility (R26.89);Repeated falls (R29.6) Pain - Right/Left: Right Pain - part of body: Hip     Time: 1610-9604 PT Time Calculation (min) (ACUTE ONLY): 28 min  Charges:  $Therapeutic Activity: 23-37 mins                     Gloriann Loan, SPT  Acute Rehabilitation Services  Office: [phone]  11/25/2019, 2:05 PM

## 2019-11-25 NOTE — Progress Notes (Addendum)
Initial Nutrition Assessment  DOCUMENTATION CODES:   Not applicable  INTERVENTION:   -D/c Boost Breeze po TID, each supplement provides 250 kcal and 9 grams of protein -Ensure Enlive po BID, each supplement provides 350 kcal and 20 grams of protein (thicken to honey consistency) -MVI with minerals daily -Magic cup TID with meals, each supplement provides 290 kcal and 9 grams of protein  NUTRITION DIAGNOSIS:   Inadequate oral intake related to dysphagia as evidenced by meal completion < 25%.  GOAL:   Patient will meet greater than or equal to 90% of their needs  MONITOR:   PO intake, Supplement acceptance, Diet advancement, Weight trends, Skin, Labs, I & O's  REASON FOR ASSESSMENT:   Consult Assessment of nutrition requirement/status  ASSESSMENT:   [name] is a 86 y.o. male with a history of diet-controlled type 2 diabetes with stage III chronic kidney disease and diabetic neuropathy, coronary artery disease status post bypass, CVA.  Patient presents with right hip pain after a mechanical fall with the patient tripping as he was trying to get out of the car.  Pt admitted with closed rt hip fracture.   7/17- s/p Procedure(s): RIGHT HIP INTRAMEDULLARY (IM) NAIL FEMORAL 7/20- s/p MBSS- advanced to dysphagia 3 diet with honey thick liquids 7/23- s/p lumbar puncture  Reviewed I/O's: -680 ml x 24 hours and +5.2 L since admission  UOP: 850 ml x 24 hours  Per neurology notes, MRI revealed multifocal acute rt cerebellar infarcts.   Spoke with pt and wife at bedside. Per pt wife, pt had a very good appetite PTA and followed a low carb diet (pt did treat himself to a hamburger every Thursday.   Per pt wife, pt is swallowing and tolerating current diet texture well, however, it is a very laborious process. Wife explained to this RD that it took her two hours to the feed the pt a few bites of pancake and boiled egg. Observed pt eating a chicken salad sandwich- pt would  chew multiple times before swallowing. Observed him consume one spoonful of thickened cranberry juice. Pt has not yet tried the [employer], however, encouraged him to try it. Intake has been poor; PO 25-75%, averaging around 20% of meals.   Noted pt has been ordered [facility], which typically does not thicken well. RD will try thickened Ensure, as pt wife report pt prefers it.   Case discussed with SLP regarding above concerns of slow feeding. Plan to downgrade diet to dysphagia 2 for ease of intake.   Revealed wt hx; wt has been stable over the past year.   Medications reviewed and include colace and 0.9% sodium chloride infusion @ 50 ml/hr.   Lab Results  Component Value Date   HGBA1C 5.1 11/19/2019   PTA DM medications are none.   Labs reviewed: K: 3.2, CBGS: 144-154 (inpatient orders for glycemic control are 0-9 units inuslun aspart every 4 hours).   NUTRITION - FOCUSED PHYSICAL EXAM:    Most Recent Value  Orbital Region No depletion  Upper Arm Region No depletion  Thoracic and Lumbar Region No depletion  Buccal Region No depletion  Temple Region No depletion  Clavicle Bone Region No depletion  Clavicle and Acromion Bone Region No depletion  Scapular Bone Region No depletion  Dorsal Hand No depletion  Patellar Region No depletion  Anterior Thigh Region No depletion  Posterior Calf Region No depletion  Edema (RD Assessment) Mild  Hair Reviewed  Eyes Reviewed  Mouth Reviewed  Skin Reviewed  Nails Reviewed       Diet Order:   Diet Order            DIET DYS 2 Room service appropriate? Yes with Assist; Fluid consistency: Honey Thick  Diet effective now                 EDUCATION NEEDS:   Education needs have been addressed  Skin:  Skin Assessment: Skin Integrity Issues: Skin Integrity Issues:: Incisions Incisions: closed rt hip, chest  Last BM:  11/24/19  Height:   Ht Readings from Last 1 Encounters:  11/15/19 6' (1.829 m)    Weight:   Wt Readings  from Last 1 Encounters:  11/25/19 (!) 93.4 kg    Ideal Body Weight:  80.9 kg  BMI:  Body mass index is 27.94 kg/m.  Estimated Nutritional Needs:   Kcal:  2200-2400  Protein:  90-105 grams  Fluid:  > 2.2 L    Loistine Chance, RD, LDN, [place] Registered Dietitian II Certified Diabetes Care and Education Specialist Please refer to [facility] for RD and/or RD on-call/weekend/after hours pager

## 2019-11-25 NOTE — Progress Notes (Signed)
  Subjective: [name] is a 86 y.o. male s/p right Hip IM nailon 11/16/19.  Pt's right hip pain is controlled overall. Denies any significant groin pain. Notes mild to moderate lateral right hip pain near incisions. Denies any fevers, chills, night sweats on interview today.  Has been using CPM machine with right knee.   Objective: Vital signs in last 24 hours: Temp:  [98 F (36.7 C)-98.9 F (37.2 C)] 98.3 F (36.8 C) (07/26 1935) Pulse Rate:  [85-98] 85 (07/26 1935) Resp:  [16-18] 18 (07/26 1935) BP: (156-170)/(86-93) 170/90 (07/26 1935) SpO2:  [97 %-100 %] 100 % (07/26 1935) Weight:  [93.4 kg] 93.4 kg (07/26 0404)  Intake/Output from previous day: 07/25 0701 - 07/26 0700 In: 170 [P.O.:170] Out: 850 [Urine:850] Intake/Output this shift: No intake/output data recorded.  Exam:  No gross blood or drainage overlying the dressing.  Dressing removed and incisions are without erythema or drainage.  Sutures intact.  No significant warmth around incision sites.  No pain with passive log roll of the RLE.   Right foot warm and well-perfused Able to dorsiflex and plantarflex the right foot   Labs: Recent Labs    11/23/19 0704 11/24/19 0547 11/24/19 2115 11/25/19 0454  HGB 7.1* 5.6* 9.7* 9.2*   Recent Labs    11/24/19 0547 11/24/19 0547 11/24/19 2115 11/25/19 0454  WBC 11.6*  --   --  15.1*  RBC 1.75*  --   --  3.08*  HCT 17.4*   < > 30.8* 28.8*  PLT 355  --   --  342   < > = values in this interval not displayed.   Recent Labs    11/24/19 0547 11/25/19 0454  NA 143 145  K 3.1* 3.2*  CL 110 110  CO2 26 26  BUN 22 23  CREATININE 1.23 1.17  GLUCOSE 149* 147*  CALCIUM 8.0* 8.1*   No results for input(s): LABPT, INR in the last 72 hours.  Assessment/Plan: Pt is s/p right hip IM nail.    -Disposition pending medical team clearance and decision  -No significant concern for surgical site infection based on exam today.     Gerrianne Scale Dani Wallner 11/25/2019, 9:54  PM

## 2019-11-25 NOTE — Progress Notes (Signed)
Orthopedic Tech Progress Note Patient Details:  [name] 11-05-1933 [id number]  CPM Right Knee CPM Right Knee: On Right Knee Flexion (Degrees): 0 Right Knee Extension (Degrees): 55 Additional Comments: took pt off CPM per ortho tech. Completed 3 hours.   Post Interventions Patient Tolerated: Well Instructions Provided: Adjustment of device  Yemaya Barnier A Naya Ilagan 11/25/2019, 3:32 PM

## 2019-11-26 ENCOUNTER — Ambulatory Visit: Payer: Medicare Other | Admitting: Physical Medicine and Rehabilitation

## 2019-11-26 DIAGNOSIS — E119 Type 2 diabetes mellitus without complications: Secondary | ICD-10-CM | POA: Diagnosis not present

## 2019-11-26 DIAGNOSIS — S72001G Fracture of unspecified part of neck of right femur, subsequent encounter for closed fracture with delayed healing: Secondary | ICD-10-CM | POA: Diagnosis not present

## 2019-11-26 DIAGNOSIS — E1122 Type 2 diabetes mellitus with diabetic chronic kidney disease: Secondary | ICD-10-CM | POA: Diagnosis not present

## 2019-11-26 DIAGNOSIS — I25810 Atherosclerosis of coronary artery bypass graft(s) without angina pectoris: Secondary | ICD-10-CM | POA: Diagnosis not present

## 2019-11-26 LAB — BASIC METABOLIC PANEL
Anion gap: 8 (ref 5–15)
BUN: 22 mg/dL (ref 8–23)
CO2: 26 mmol/L (ref 22–32)
Calcium: 8 mg/dL — ABNORMAL LOW (ref 8.9–10.3)
Chloride: 110 mmol/L (ref 98–111)
Creatinine, Ser: 1.08 mg/dL (ref 0.61–1.24)
GFR calc Af Amer: >60 mL/min (ref >60)
GFR calc non Af Amer: >60 mL/min (ref >60)
Glucose, Bld: 151 mg/dL — ABNORMAL HIGH (ref 70–99)
Potassium: 3.2 mmol/L — ABNORMAL LOW (ref 3.5–5.1)
Sodium: 144 mmol/L (ref 135–145)

## 2019-11-26 LAB — GLUCOSE, CAPILLARY
Glucose-Capillary: 139 mg/dL — ABNORMAL HIGH (ref 70–99)
Glucose-Capillary: 140 mg/dL — ABNORMAL HIGH (ref 70–99)
Glucose-Capillary: 152 mg/dL — ABNORMAL HIGH (ref 70–99)
Glucose-Capillary: 154 mg/dL — ABNORMAL HIGH (ref 70–99)
Glucose-Capillary: 158 mg/dL — ABNORMAL HIGH (ref 70–99)
Glucose-Capillary: 171 mg/dL — ABNORMAL HIGH (ref 70–99)

## 2019-11-26 LAB — CBC
HCT: 29 % — ABNORMAL LOW (ref 39.0–52.0)
Hemoglobin: 9.4 g/dL — ABNORMAL LOW (ref 13.0–17.0)
MCH: 30.7 pg (ref 26.0–34.0)
MCHC: 32.4 g/dL (ref 30.0–36.0)
MCV: 94.8 fL (ref 80.0–100.0)
Platelets: 389 10*3/uL (ref 150–400)
RBC: 3.06 MIL/uL — ABNORMAL LOW (ref 4.22–5.81)
RDW: 17 % — ABNORMAL HIGH (ref 11.5–15.5)
WBC: 17.9 10*3/uL — ABNORMAL HIGH (ref 4.0–10.5)
nRBC: 0.1 % (ref 0.0–0.2)

## 2019-11-26 LAB — CSF CULTURE W GRAM STAIN: Culture: NO GROWTH

## 2019-11-26 LAB — MAGNESIUM: Magnesium: 2 mg/dL (ref 1.7–2.4)

## 2019-11-26 MED ORDER — POTASSIUM CHLORIDE 10 MEQ/100ML IV SOLN
10.0000 meq | INTRAVENOUS | Status: AC
  Administered 2019-11-26 (×2): 10 meq via INTRAVENOUS
  Filled 2019-11-26 (×2): qty 100

## 2019-11-26 NOTE — Progress Notes (Signed)
PROGRESS NOTE    [name]  SNK:[id number] DOB: 22-Nov-1933 DOA: 11/15/2019 PCP: [name], MD   Brief Narrative:  86 year old with history of essential hypertension, CAD status post CABG 2017, CVA, TIA, AAA, GI bleed, DM2, IBS, HLD, carotid stenosis, CKD stage III OA admitted for mechanical fall with right hip fracture requiring surgical repair on 11/16/2019 started on postop DVT prophylaxis Xarelto.  Hospital course complicated by postop anemia requiring 1 unit PRBC transfusion.  Also developed metabolic encephalopathy, CT head negative but MRI brain showed acute CVA, neurology consulted.  EEG was overall unremarkable.  Had elevated troponin therefore cardiology team consulted as well.  Due to patient being lethargic, extensive work-up for encephalopathy was performed which was negative.  Lyrica was tapered off.  Ended up growing MSSA bacteremia started on Ancef.  Surveillance cultures remain negative.   Assessment & Plan:   Principal Problem:   Closed right hip fracture (HCC) Active Problems:   GERD   Supine hypertension   PVD (peripheral vascular disease) (HCC)   Coronary artery disease involving coronary bypass graft of native heart without angina pectoris   Diabetes mellitus type 2, diet-controlled ([place])   CKD stage 3 due to type 2 diabetes mellitus (HCC)   Dysautonomia orthostatic hypotension syndrome (HCC)   Diabetic neuropathy (HCC)   History of lacunar cerebrovascular accident (CVA)   Elevated troponin   Stroke (cerebrum) (HCC)   MSSA bacteremia   Acute encephalopathy, slow to improve, slightly better today. Acute medial left parietal lobe infarct, multifocal -Lyrica has been tapered off.  Mostly his exam is nonfocal in nature.  Suspect partly his encephalopathy is also from underlying infection/bacteremia but that appears to be improving. -CT head negative, MRI brain consistent with acute CVA-multifocal, unchanged bilateral papilledema, chronic microvascular  ischemic changes in multiple areas of the brain, moderate cerebral atrophy -EEG-unremarkable, ammonia levels-unremarkable -Transcranial Dopplers-negative bubble study -Echocardiogram-EF 60 to 65%, moderate LVH, grade 2 DD -A1c-6.1 -LDL-59 -TSH-within normal limits -Ammonia-normal -Repeat CT head 7/23-negative for acute pathology -LP 7/23-negative for any evidence of infection  Fevers likely secondary to acute cystitis without hematuria MSSE bacteremia, suspect hip repair as a source -Intermittently spiking low-grade temperature, continue IV Ancef.  In the meantime working with speech team, if he consistently takes p.o., hemodynamically stabilizes we can transition to p.o. antibiotics.  Suspect he will at least need 2-3 weeks of antibiotics. -Echocardiogram preoperatively did not show any signs of vegetation -Surveillance culture-negative -Seen by orthopedic team. -CT of the pelvis negative for any acute pathology.  Abdominal distention with some nausea, resolved -Abdominal x-ray-negative.  Out of bed to chair  Acute blood loss anemia postop -Initial transfusion 7/21.  Thereafter hemoglobin remained stable but this morning it dropped again.  Repeat another unit transfusion -Hold off on Xarelto-DVT prophylaxis -Seen by orthopedic, Dr Erlinda Hong   Closed right hip fracture status post surgical repair 7/21 -Postop management per orthopedic.  Status post right hip IM nail.  Eventually will need to go to SNF.  Elevated troponin, resolved- -Likely demand ischemia.  Cardiology following -Echo-EF 93-79%, grade 2 diastolic dysfunction.  Sinus tachycardia -Improved with IV fluids  Autonomic dysfunction -Daily Florinef  Dysphagia -Speech team following.  Aspiration precautions.  Would eventually benefit from modified barium swallow  CKD stage IIIA -creatinine around baseline of 1.4  History of CVA Currently on daily aspirin and Lipitor 80 mg daily.  Further recommendations by neurology  team  Coronary artery disease status post CABG Currently chest pain-free.  Ambulatory dysfunction post right  hip repair PT/OT recommending SNF    DVT prophylaxis: Daily Xarelto 10 mg-currently on hold Code Status: DNR Family Communication: Wife at bedside  Status is: Inpatient  Remains inpatient appropriate because:Inpatient level of care appropriate due to severity of illness   Dispo:  Patient From: Home  Planned Disposition: [place]  Expected discharge date: 2 days  Medically stable for discharge: No.  Intermittently still becomes encephalopathic and drowsy.  Also has some ongoing dysphagia but slowly improving.  He is working with speech and swallow therapy.  The meantime continue IV antibiotics for bacteremia.  Eventually will go to SNF Body mass index is 28.32 kg/m.   Subjective: Work to look with more yesterday with physical therapy with this morning he is resting.  Attempted to eat a little bit more as well.  Wife is present at bedside during my evaluation.  Review of Systems Otherwise negative except as per HPI, including: General: Denies fever, chills, night sweats or unintended weight loss. Resp: Denies cough, wheezing, shortness of breath. Cardiac: Denies chest pain, palpitations, orthopnea, paroxysmal nocturnal dyspnea. GI: Denies abdominal pain, nausea, vomiting, diarrhea or constipation GU: Denies dysuria, frequency, hesitancy or incontinence MS: Denies muscle aches, joint pain or swelling Neuro: Denies headache, neurologic deficits (focal weakness, numbness, tingling), abnormal gait Psych: Denies anxiety, depression, SI/HI/AVH Skin: Denies new rashes or lesions ID: Denies sick contacts, exotic exposures, travel  Examination: Constitutional: Drowsy but easily arousable Respiratory: Minimal bibasilar rhonchi Cardiovascular: Normal sinus rhythm, no rubs Abdomen: Nontender nondistended good bowel sounds Musculoskeletal: No edema  noted Skin: Right hip dressing site slightly tense but no obvious evidence of erythema.  Very mild warmth. Neurologic: Most of his exam is nonfocal, grossly moving all the extremities Psychiatric: Poor judgment and insight, alert to name and place Objective: Vitals:   11/26/19 0430 11/26/19 0434 11/26/19 0551 11/26/19 0748  BP: (!) 143/72   (!) 153/75  Pulse: 91   78  Resp: 18   15  Temp: 100.2 F (37.9 C)  99.7 F (37.6 C) 98.6 F (37 C)  TempSrc: Oral  Oral Oral  SpO2: 96%   97%  Weight:  (!) 94.7 kg    Height:        Intake/Output Summary (Last 24 hours) at 11/26/2019 1143 Last data filed at 11/26/2019 0752 Gross per 24 hour  Intake 200 ml  Output 1675 ml  Net -1475 ml   Filed Weights   11/23/19 0206 11/25/19 0404 11/26/19 0434  Weight: (!) 93.2 kg (!) 93.4 kg (!) 94.7 kg     Data Reviewed:   CBC: Recent Labs  Lab 11/22/19 0647 11/22/19 0647 11/23/19 0704 11/24/19 0547 11/24/19 2115 11/25/19 0454 11/26/19 0423  WBC 14.0*  --  12.5* 11.6*  --  15.1* 17.9*  HGB 7.9*   < > 7.1* 5.6* 9.7* 9.2* 9.4*  HCT 25.4*   < > 22.6* 17.4* 30.8* 28.8* 29.0*  MCV 99.2  --  98.7 99.4  --  93.5 94.8  PLT 254  --  262 355  --  342 389   < > = values in this interval not displayed.   Basic Metabolic Panel: Recent Labs  Lab 11/22/19 0647 11/23/19 0704 11/24/19 0547 11/25/19 0454 11/26/19 0423  NA 144 143 143 145 144  K 3.2* 3.3* 3.1* 3.2* 3.2*  CL 108 109 110 110 110  CO2 27 25 26 26 26   GLUCOSE 140* 130* 149* 147* 151*  BUN 23 23 22 23 22   CREATININE 1.20  1.21 1.23 1.17 1.08  CALCIUM 8.4* 8.1* 8.0* 8.1* 8.0*  MG 2.0 1.9 2.0 2.1 2.0   GFR: Estimated Creatinine Clearance: 60.8 mL/min (by C-G formula based on SCr of 1.08 mg/dL). Liver Function Tests: No results for input(s): AST, ALT, ALKPHOS, BILITOT, PROT, ALBUMIN in the last 168 hours. No results for input(s): LIPASE, AMYLASE in the last 168 hours. Recent Labs  Lab 11/20/19 1413  AMMONIA 20   Coagulation  Profile: No results for input(s): INR, PROTIME in the last 168 hours. Cardiac Enzymes: No results for input(s): CKTOTAL, CKMB, CKMBINDEX, TROPONINI in the last 168 hours. BNP (last 3 results) No results for input(s): PROBNP in the last 8760 hours. HbA1C: No results for input(s): HGBA1C in the last 72 hours. CBG: Recent Labs  Lab 11/25/19 1938 11/26/19 0026 11/26/19 0426 11/26/19 0729 11/26/19 1129  GLUCAP 162* 140* 139* 154* 152*   Lipid Profile: No results for input(s): CHOL, HDL, LDLCALC, TRIG, CHOLHDL, LDLDIRECT in the last 72 hours. Thyroid Function Tests: No results for input(s): TSH, T4TOTAL, FREET4, T3FREE, THYROIDAB in the last 72 hours. Anemia Panel: No results for input(s): VITAMINB12, FOLATE, FERRITIN, TIBC, IRON, RETICCTPCT in the last 72 hours. Sepsis Labs: Recent Labs  Lab 11/21/19 0909  PROCALCITON 0.10    Recent Results (from the past 240 hour(s))  Culture, blood (Routine X 2) w Reflex to ID Panel     Status: Abnormal   Collection Time: 11/21/19  9:09 AM   Specimen: BLOOD RIGHT HAND  Result Value Ref Range Status   Specimen Description BLOOD RIGHT HAND  Final   Special Requests   Final    BOTTLES DRAWN AEROBIC ONLY Blood Culture adequate volume   Culture  Setup Time   Final    GRAM POSITIVE COCCI IN CLUSTERS AEROBIC BOTTLE ONLY CRITICAL RESULT CALLED TO, READ BACK BY AND VERIFIED WITH: Sarajane Marek [phone]  Performed at [facility], [place] [address], [place], [place] [address]    Culture STAPHYLOCOCCUS EPIDERMIDIS (A)  Final   Report Status 11/24/2019 FINAL  Final   Organism ID, Bacteria STAPHYLOCOCCUS EPIDERMIDIS  Final      Susceptibility   Staphylococcus epidermidis - MIC*    CIPROFLOXACIN >=8 RESISTANT Resistant     ERYTHROMYCIN <=0.25 SENSITIVE Sensitive     GENTAMICIN <=0.5 SENSITIVE Sensitive     OXACILLIN <=0.25 SENSITIVE Sensitive     TETRACYCLINE <=1 SENSITIVE Sensitive     VANCOMYCIN 1 SENSITIVE Sensitive      TRIMETH/SULFA 160 RESISTANT Resistant     CLINDAMYCIN <=0.25 SENSITIVE Sensitive     RIFAMPIN <=0.5 SENSITIVE Sensitive     Inducible Clindamycin NEGATIVE Sensitive     * STAPHYLOCOCCUS EPIDERMIDIS  Blood Culture ID Panel (Reflexed)     Status: Abnormal   Collection Time: 11/21/19  9:09 AM  Result Value Ref Range Status   Enterococcus species NOT DETECTED NOT DETECTED Final   Listeria monocytogenes NOT DETECTED NOT DETECTED Final   Staphylococcus species DETECTED (A) NOT DETECTED Final    Comment: Methicillin (oxacillin) susceptible coagulase negative staphylococcus. Possible blood culture contaminant (unless isolated from more than one blood culture draw or clinical case suggests pathogenicity). No antibiotic treatment is indicated for blood  culture contaminants. CRITICAL RESULT CALLED TO, READ BACK BY AND VERIFIED WITH: Sarajane Marek [phone] SK     Staphylococcus aureus (BCID) NOT DETECTED NOT DETECTED Final   Methicillin resistance NOT DETECTED NOT DETECTED Final   Streptococcus species NOT DETECTED NOT  DETECTED Final   Streptococcus agalactiae NOT DETECTED NOT DETECTED Final   Streptococcus pneumoniae NOT DETECTED NOT DETECTED Final   Streptococcus pyogenes NOT DETECTED NOT DETECTED Final   Acinetobacter baumannii NOT DETECTED NOT DETECTED Final   Enterobacteriaceae species NOT DETECTED NOT DETECTED Final   Enterobacter cloacae complex NOT DETECTED NOT DETECTED Final   Escherichia coli NOT DETECTED NOT DETECTED Final   Klebsiella oxytoca NOT DETECTED NOT DETECTED Final   Klebsiella pneumoniae NOT DETECTED NOT DETECTED Final   Proteus species NOT DETECTED NOT DETECTED Final   Serratia marcescens NOT DETECTED NOT DETECTED Final   Haemophilus influenzae NOT DETECTED NOT DETECTED Final   Neisseria meningitidis NOT DETECTED NOT DETECTED Final   Pseudomonas aeruginosa NOT DETECTED NOT DETECTED Final   Candida albicans NOT DETECTED NOT DETECTED Final   Candida glabrata NOT  DETECTED NOT DETECTED Final   Candida krusei NOT DETECTED NOT DETECTED Final   Candida parapsilosis NOT DETECTED NOT DETECTED Final   Candida tropicalis NOT DETECTED NOT DETECTED Final    Comment: Performed at [facility], [place] [address] [place], [place] [address]  Culture, blood (Routine X 2) w Reflex to ID Panel     Status: Abnormal   Collection Time: 11/21/19  9:13 AM   Specimen: BLOOD LEFT HAND  Result Value Ref Range Status   Specimen Description BLOOD LEFT HAND  Final   Special Requests   Final    BOTTLES DRAWN AEROBIC ONLY Blood Culture results may not be optimal due to an inadequate volume of blood received in culture bottles   Culture  Setup Time   Final    GRAM POSITIVE COCCI IN CLUSTERS AEROBIC BOTTLE ONLY CRITICAL VALUE NOTED.  VALUE IS CONSISTENT WITH PREVIOUSLY REPORTED AND CALLED VALUE.    Culture (A)  Final    STAPHYLOCOCCUS EPIDERMIDIS SUSCEPTIBILITIES PERFORMED ON PREVIOUS CULTURE WITHIN THE LAST 5 DAYS. Performed at [facility], [place] [address] [place], [place] [address]    Report Status 11/24/2019 FINAL  Final  Anaerobic culture     Status: None (Preliminary result)   Collection Time: 11/22/19  5:15 PM   Specimen: PATH Cytology CSF; Cerebrospinal Fluid  Result Value Ref Range Status   Specimen Description CSF  Final   Special Requests   Final    NONE Performed at [facility], [place] [address], [place], [place] [address]    Culture   Final    NO ANAEROBES ISOLATED; CULTURE IN PROGRESS FOR 5 DAYS   Report Status PENDING  Incomplete  CSF culture     Status: None   Collection Time: 11/22/19  5:15 PM   Specimen: PATH Cytology CSF; Cerebrospinal Fluid  Result Value Ref Range Status   Specimen Description CSF  Final   Special Requests NONE  Final   Gram Stain   Final    WBC PRESENT, PREDOMINANTLY MONONUCLEAR NO ORGANISMS SEEN CYTOSPIN SMEAR    Culture   Final    NO GROWTH 3 DAYS Performed at [facility], [place] [address]  [place], [place] [address]    Report Status 11/26/2019 FINAL  Final  Culture, fungus without smear     Status: None (Preliminary result)   Collection Time: 11/22/19  5:15 PM   Specimen: PATH Cytology CSF; Cerebrospinal Fluid  Result Value Ref Range Status   Specimen Description CSF  Final   Special Requests NONE  Final   Culture   Final    NO FUNGUS ISOLATED AFTER  3 DAYS Performed at [facility], [place] [address], [place], [place] [address]    Report Status PENDING  Incomplete  Culture, blood (Routine X 2) w Reflex to ID Panel     Status: None (Preliminary result)   Collection Time: 11/24/19  5:45 AM   Specimen: BLOOD  Result Value Ref Range Status   Specimen Description BLOOD RIGHT ANTECUBITAL  Final   Special Requests   Final    BOTTLES DRAWN AEROBIC AND ANAEROBIC Blood Culture adequate volume   Culture   Final    NO GROWTH 1 DAY Performed at [facility], [place] [address], [place], [place] [address]    Report Status PENDING  Incomplete  Culture, blood (Routine X 2) w Reflex to ID Panel     Status: None (Preliminary result)   Collection Time: 11/24/19  5:46 AM   Specimen: BLOOD LEFT HAND  Result Value Ref Range Status   Specimen Description BLOOD LEFT HAND  Final   Special Requests   Final    BOTTLES DRAWN AEROBIC AND ANAEROBIC Blood Culture adequate volume   Culture   Final    NO GROWTH 1 DAY Performed at [facility], [place] [address] [place], [place] [address]    Report Status PENDING  Incomplete         Radiology Studies: DG Abd 1 View  Result Date: 11/25/2019 CLINICAL DATA:  Abdominal distension. EXAM: ABDOMEN - 1 VIEW COMPARISON:  None. FINDINGS: The bowel gas pattern is normal. Residual contrast is noted throughout the colon. No radio-opaque calculi or other significant radiographic abnormality are seen. IMPRESSION: No evidence of bowel obstruction or ileus. Electronically Signed   By: Marijo Conception M.D.   On: 11/25/2019 14:31        Scheduled  Meds: . aspirin EC  81 mg Oral Daily  . atorvastatin  80 mg Oral Daily  . docusate sodium  100 mg Oral BID  . feeding supplement (ENSURE ENLIVE)  237 mL Oral BID BM  . fludrocortisone  0.1 mg Oral Daily  . insulin aspart  0-9 Units Subcutaneous Q4H  . multivitamin with minerals  1 tablet Oral Daily  . pantoprazole  40 mg Oral Daily  . polyethylene glycol  17 g Oral Daily   Continuous Infusions: . sodium chloride 50 mL/hr at 11/25/19 2234  .  ceFAZolin (ANCEF) IV 2 g (11/26/19 0547)  . potassium chloride 10 mEq (11/26/19 1044)     LOS: 11 days   Time spent= 35 mins    Jevaeh Shams Arsenio Loader, MD [facility]  If 7PM-7AM, please contact night-coverage  11/26/2019, 11:43 AM

## 2019-11-26 NOTE — Progress Notes (Signed)
Occupational Therapy Treatment Patient Details Name: MERCURY ROCK MRN: [id number] DOB: July 14, 1933 Today's Date: 11/26/2019    History of present illness Pt is a 86 y.o. male with a history of diet-controlled type 2 diabetes with stage III chronic kidney disease and diabetic neuropathy, coronary artery disease status post bypass, CVA.  Pt presents with a fall and subsequent right hip intertrochanteric fracture. pt had IMHS nailing 11/16/2019. Pt developed metabolic encephalopathy and acute cystitis. imaging also noted pt to have acute L parietal lobe infarct.    OT comments  Patient met lying supine in bed in agreement with OT treatment session with focus on bed mobility, self-care re-education, and functional transfers with requiring Max A +2 for sit to stand from elevated EOB to RW with maximal multimodal cues for sequencing and hand placement. Patient unable to follow 1-step commands for heel-toe scoot to recliner given RLE NWB status so facilitation of squat-pivot transfer to recliner in R with Max A +2 and additional assistance for line management and equipment. Patient able to maintain static sitting balance at EOB with initial Max A +2 progressing to Max A +1 with multimodal cues for orientation to midline and to correct L pushing. Patient would benefit from continued acute OT services in prep for d/c to next level of care with short-term SNF placement remaining appropriate.    Follow Up Recommendations  SNF;Supervision/Assistance - 24 hour    Equipment Recommendations  None recommended by OT    Recommendations for Other Services      Precautions / Restrictions Precautions Precautions: Fall Precaution Comments: monitor sit balance, L pusher syndrome Restrictions Weight Bearing Restrictions: Yes RLE Weight Bearing: Non weight bearing       Mobility Bed Mobility Overal bed mobility: Needs Assistance Bed Mobility: Supine to Sit     Supine to sit: +2 for physical  assistance;+2 for safety/equipment;HOB elevated;Total assist        Transfers Overall transfer level: Needs assistance Equipment used: Rolling walker (2 wheeled) Transfers: Sit to/from [employer] Sit to Stand: Max assist;+2 physical assistance (+3 to maintain NWB status in RLE. )         General transfer comment: Patient with better     Balance Overall balance assessment: Needs assistance Sitting-balance support: Feet supported;Bilateral upper extremity supported Sitting balance-Leahy Scale: Poor Sitting balance - Comments: +2 assist progressing to Max A +1 to maintain sitting balance at EOB.    Standing balance support: Bilateral upper extremity supported Standing balance-Leahy Scale: Poor Standing balance comment: External assist +2                           ADL either performed or assessed with clinical judgement   ADL Overall ADL's : Needs assistance/impaired     Grooming: Minimal assistance;Sitting Grooming Details (indicate cue type and reason): Multimodal cues for initiation and sequencing.                                     Vision       Perception     Praxis      Cognition Arousal/Alertness: Awake/alert;Lethargic Behavior During Therapy: Flat affect Overall Cognitive Status: Impaired/Different from baseline Area of Impairment: Awareness;Problem solving;Following commands                       Following Commands: Follows one step commands inconsistently;Follows one  step commands with increased time Safety/Judgement: Decreased awareness of safety;Decreased awareness of deficits Awareness: Intellectual Problem Solving: Slow processing;Decreased initiation;Difficulty sequencing;Requires verbal cues;Requires tactile cues General Comments: Patient more alert and able to follow 1-step vc's with increased accuracy ~75%.         Exercises     Shoulder Instructions       General Comments       Pertinent Vitals/ Pain       Pain Assessment: Faces Faces Pain Scale: Hurts little more Pain Location: R hip Pain Descriptors / Indicators: Guarding Pain Intervention(s): Monitored during session;Limited activity within patient's tolerance  Home Living                                          Prior Functioning/Environment              Frequency  Min 2X/week        Progress Toward Goals  OT Goals(current goals can now be found in the care plan section)  Progress towards OT goals: Progressing toward goals  Acute Rehab OT Goals Patient Stated Goal: No goals stated OT Goal Formulation: With family Time For Goal Achievement: 12/01/19 Potential to Achieve Goals: Good ADL Goals Pt Will Perform Grooming: with set-up;sitting Pt Will Perform Upper Body Dressing: with supervision;sitting Pt Will Perform Lower Body Dressing: with mod assist;sitting/lateral leans;with adaptive equipment Pt Will Transfer to Toilet: with mod assist;squat pivot transfer;bedside commode Additional ADL Goal #1: Patient will require min A with min verbal cues for sequencing for bed mobility in preparation for self care tasks.  Plan Discharge plan remains appropriate    Co-evaluation                 AM-PAC OT "6 Clicks" Daily Activity     Outcome Measure   Help from another person eating meals?: A Little Help from another person taking care of personal grooming?: A Lot Help from another person toileting, which includes using toliet, bedpan, or urinal?: Total Help from another person bathing (including washing, rinsing, drying)?: A Lot Help from another person to put on and taking off regular upper body clothing?: A Lot Help from another person to put on and taking off regular lower body clothing?: Total 6 Click Score: 11    End of Session Equipment Utilized During Treatment: Gait belt;Rolling walker  OT Visit Diagnosis: Other abnormalities of gait and mobility  (R26.89);History of falling (Z91.81);Muscle weakness (generalized) (M62.81);Pain Pain - Right/Left: Right Pain - part of body: Hip   Activity Tolerance Patient tolerated treatment well   Patient Left in chair;with call bell/phone within reach;with chair alarm set   Nurse Communication Mobility status        Time: 1100-1144 OT Time Calculation (min): 44 min  Charges: OT General Charges $OT Visit: 1 Visit OT Treatments $Self Care/Home Management : 8-22 mins $Therapeutic Activity: 23-37 mins  Lonzy Mato H. OTR/L Supplemental OT, Department of rehab services [phone]   Venesa Semidey R H. 11/26/2019, 1:38 PM

## 2019-11-26 NOTE — Progress Notes (Signed)
Pharmacy Antibiotic Note  [name] is a 86 y.o. male admitted on 11/15/2019 with right hip fracture after a fall s/p surgical repair, now with positive blood cultures. Pharmacy has been consulted for cefazolin dosing.  Patient was previously on cephalexin for fevers thought to be from UTI but was changed to IV cefazolin after positive blood culture results. Patient's WBC is trending up, with Tmax 100.2. Repeat blood cultures have been drawn. Patient's renal function continues to improve.    Plan: Continue cefazolin 2gm every 8 hours Monitor renal function, clinical status, repeat blood cultures, and antibiotic LOT  Height: 6' (182.9 cm) Weight: (!) 94.7 kg (208 lb 12.4 oz) IBW/kg (Calculated) : 77.6  Temp (24hrs), Avg:99.1 F (37.3 C), Min:98.3 F (36.8 C), Max:100.2 F (37.9 C)  Recent Labs  Lab 11/19/19 0908 11/21/19 0501 11/22/19 0647 11/23/19 0704 11/24/19 0547 11/25/19 0454 11/26/19 0423  WBC  --    < > 14.0* 12.5* 11.6* 15.1* 17.9*  CREATININE  --    < > 1.20 1.21 1.23 1.17 1.08  LATICACIDVEN 0.9  --   --   --   --   --   --    < > = values in this interval not displayed.    Estimated Creatinine Clearance: 60.8 mL/min (by C-G formula based on SCr of 1.08 mg/dL).    Allergies  Allergen Reactions  . Other Other (See Comments)    Pt reports allergic to a medication but unsure of name or type of med  . Cymbalta [Duloxetine Hcl] Other (See Comments)    Dizziness, hallucinations.   . Keflex [Cephalexin] Other (See Comments)    dizziness  . Simvastatin Other (See Comments)    Joint pain  . Sudafed [Pseudoephedrine] Other (See Comments)    Dizziness    Antimicrobials this admission: 7/17 vancomycin x1 7/22 cephalexin >> 7/24 7/24 cefazolin >>   Dose adjustments this admission:  Microbiology results: Bcx 7/22: 2 of 4 MSSE - R to cipro and bactrim, sens to oxacillin Bcx 7/25: ngtd CSF 7/23: ngtd  Thank you for allowing pharmacy to be a part of this  patient's care.  Romilda Garret, PharmD PGY1 Acute Care Pharmacy Resident Phone: [phone] 11/26/2019 8:28 AM  Please check [email or web address] for unit specific pharmacy phone numbers.

## 2019-11-27 DIAGNOSIS — G903 Multi-system degeneration of the autonomic nervous system: Secondary | ICD-10-CM | POA: Diagnosis not present

## 2019-11-27 DIAGNOSIS — E119 Type 2 diabetes mellitus without complications: Secondary | ICD-10-CM | POA: Diagnosis not present

## 2019-11-27 DIAGNOSIS — G9340 Encephalopathy, unspecified: Secondary | ICD-10-CM

## 2019-11-27 DIAGNOSIS — E1122 Type 2 diabetes mellitus with diabetic chronic kidney disease: Secondary | ICD-10-CM | POA: Diagnosis not present

## 2019-11-27 DIAGNOSIS — S72001G Fracture of unspecified part of neck of right femur, subsequent encounter for closed fracture with delayed healing: Secondary | ICD-10-CM | POA: Diagnosis not present

## 2019-11-27 LAB — BASIC METABOLIC PANEL WITH GFR
Anion gap: 8 (ref 5–15)
BUN: 24 mg/dL — ABNORMAL HIGH (ref 8–23)
CO2: 27 mmol/L (ref 22–32)
Calcium: 8.1 mg/dL — ABNORMAL LOW (ref 8.9–10.3)
Chloride: 109 mmol/L (ref 98–111)
Creatinine, Ser: 1.23 mg/dL (ref 0.61–1.24)
GFR calc Af Amer: >60 mL/min
GFR calc non Af Amer: 54 mL/min — ABNORMAL LOW
Glucose, Bld: 146 mg/dL — ABNORMAL HIGH (ref 70–99)
Potassium: 3 mmol/L — ABNORMAL LOW (ref 3.5–5.1)
Sodium: 144 mmol/L (ref 135–145)

## 2019-11-27 LAB — ANAEROBIC CULTURE

## 2019-11-27 LAB — GLUCOSE, CAPILLARY
Glucose-Capillary: 119 mg/dL — ABNORMAL HIGH (ref 70–99)
Glucose-Capillary: 124 mg/dL — ABNORMAL HIGH (ref 70–99)
Glucose-Capillary: 132 mg/dL — ABNORMAL HIGH (ref 70–99)
Glucose-Capillary: 140 mg/dL — ABNORMAL HIGH (ref 70–99)
Glucose-Capillary: 152 mg/dL — ABNORMAL HIGH (ref 70–99)
Glucose-Capillary: 207 mg/dL — ABNORMAL HIGH (ref 70–99)

## 2019-11-27 LAB — CBC
HCT: 28.1 % — ABNORMAL LOW (ref 39.0–52.0)
Hemoglobin: 8.9 g/dL — ABNORMAL LOW (ref 13.0–17.0)
MCH: 30.1 pg (ref 26.0–34.0)
MCHC: 31.7 g/dL (ref 30.0–36.0)
MCV: 94.9 fL (ref 80.0–100.0)
Platelets: 387 10*3/uL (ref 150–400)
RBC: 2.96 MIL/uL — ABNORMAL LOW (ref 4.22–5.81)
RDW: 16.5 % — ABNORMAL HIGH (ref 11.5–15.5)
WBC: 18.4 10*3/uL — ABNORMAL HIGH (ref 4.0–10.5)
nRBC: 0 % (ref 0.0–0.2)

## 2019-11-27 LAB — MAGNESIUM: Magnesium: 2.2 mg/dL (ref 1.7–2.4)

## 2019-11-27 LAB — SARS CORONAVIRUS 2 BY RT PCR (HOSPITAL ORDER, PERFORMED IN ~~LOC~~ HOSPITAL LAB): SARS Coronavirus 2: NEGATIVE

## 2019-11-27 MED ORDER — LINEZOLID 600 MG/300ML IV SOLN
600.0000 mg | Freq: Two times a day (BID) | INTRAVENOUS | Status: DC
  Administered 2019-11-27 – 2019-11-29 (×6): 600 mg via INTRAVENOUS
  Filled 2019-11-27 (×8): qty 300

## 2019-11-27 MED ORDER — POTASSIUM CHLORIDE CRYS ER 20 MEQ PO TBCR
40.0000 meq | EXTENDED_RELEASE_TABLET | Freq: Two times a day (BID) | ORAL | Status: AC
  Administered 2019-11-27 (×2): 40 meq via ORAL
  Filled 2019-11-27 (×2): qty 2

## 2019-11-27 NOTE — Progress Notes (Addendum)
  Speech Language Pathology Treatment: Dysphagia  Patient Details Name: [name] MRN: [id number] DOB: 1933-06-29 Today's Date: 11/27/2019 Time: 2641-5830 SLP Time Calculation (min) (ACUTE ONLY): 18 min  Assessment / Plan / Recommendation Clinical Impression  SLP in for follow up to assess tolerance of dys 2 diet and honey thick liquids. Pt has demonstrated variable intake, but no overt s/s aspiration observed on current diet. Pt may benefit from repeat MBS this week (prior MBS 11/19/19) given silent penetration on prior study. Pt is more consistently alert.   HPI HPI:  86 y.o. male with a history of diet-controlled type 2 diabetes with stage III chronic kidney disease and diabetic neuropathy, coronary artery disease status post bypass, CVA.  Patient presents with right hip fx after a mechanical fall.  Underwent right hip IM nail placement 7/17. Now with acute metabolic encephalopathy.  [name] present, she reports hx of increasing swallowing difficulty in the last several months, with coughing/choking during meals.       SLP Plan  Continue with current plan of care       Recommendations  Diet recommendations: Dysphagia 2 (fine chop);Honey-thick liquid Liquids provided via: Cup;Straw Medication Administration: Whole meds with puree Supervision: Full supervision/cueing for compensatory strategies;Staff to assist with self feeding Compensations: Slow rate;Small sips/bites;Minimize environmental distractions Postural Changes and/or Swallow Maneuvers: Seated upright 90 degrees;Upright 30-60 min after meal                Oral Care Recommendations: Staff/trained caregiver to provide oral care;Oral care QID Follow up Recommendations: Skilled Nursing facility;24 hour supervision/assistance SLP Visit Diagnosis: Dysphagia, oropharyngeal phase (R13.12) Plan: Continue with current plan of care       GO               Cindel Daugherty B. Quentin Ore, [facility], [place] Speech Language  Pathologist Office: [phone]  Shonna Chock 11/27/2019, 10:48 AM

## 2019-11-27 NOTE — Progress Notes (Signed)
Physical Therapy Treatment Patient Details Name: [name] MRN: [id number] DOB: [birth year] Today's Date: 11/27/2019    History of Present Illness Pt is a 86 y.o. male with a history of diet-controlled type 2 diabetes with stage III chronic kidney disease and diabetic neuropathy, coronary artery disease status post bypass, CVA.  Pt presents with a fall and subsequent right hip intertrochanteric fracture. pt had IMHS nailing 11/16/2019. Pt developed metabolic encephalopathy and acute cystitis. imaging also noted pt to have acute L parietal lobe infarct.     PT Comments    Pt admitted with above diagnosis. Pt able to get to EOB with total assist and needed max to total assist to sit EOB due to left lateral lean.  Needed incr assist to scoot transfer/squat pivot to drop arm recliner with pt giving little effort. Once in chair, pt was able to perform some exercises. Pt with delayed responses when cuing pt.   Pt currently with functional limitations due to balance, endurance and cognitive deficits. Pt will benefit from skilled PT to increase their independence and safety with mobility to allow discharge to the venue listed below.     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 pt was total assit +2 with supine to sit   Transfers Overall transfer level: Needs assistance Equipment used: None Transfers: Set designer Transfers;Sit to/from Stand Sit to Stand: Max assist;+2 physical assistance;Total assist (+3 to maintain NWB status in RLE. )   Squat pivot transfers: Total assist;Max assist;+2 physical assistance;From elevated surface     General transfer comment: Pt could not lift buttocks off bed more than 2 inches even with +2 assist. Assist to the chair squat pivot with use of pad with total assist as pt just not assisting much at all.   Ambulation/Gait             General Gait Details: deferred, unable   Stairs             Wheelchair Mobility    Modified Rankin (Stroke Patients Only) Modified Rankin (Stroke Patients Only) Pre-Morbid Rankin Score: No symptoms Modified Rankin: Severe disability     Balance Overall balance assessment: Needs assistance Sitting-balance support: Feet supported;Bilateral upper extremity supported Sitting balance-Leahy Scale: Poor Sitting balance - Comments: +2 assist progressing to Max A +1 to maintain sitting balance at EOB.  Postural control: Left lateral lean Standing balance support: Bilateral upper extremity supported Standing balance-Leahy Scale: Zero Standing balance comment: could not stand today                            Cognition Arousal/Alertness: Awake/alert;Lethargic Behavior During Therapy: Flat affect Overall Cognitive Status: Impaired/Different from baseline Area of Impairment: Awareness;Problem solving;Following  commands                 Orientation Level: Time;Situation Current Attention Level: Selective Memory: Decreased short-term memory Following Commands: Follows one step commands inconsistently;Follows one step commands with increased time Safety/Judgement: Decreased awareness of  safety;Decreased awareness of deficits Awareness: Intellectual Problem Solving: Slow processing;Decreased initiation;Difficulty sequencing;Requires verbal cues;Requires tactile cues General Comments: Patient less alert and only able to follow 1-step vc's 50 % of time       Exercises General Exercises - Lower Extremity Ankle Circles/Pumps: AROM;AAROM;5 reps;Supine Quad Sets: AAROM;5 reps;Both;Supine Long Arc Quad: AROM;10 reps;Seated;Right;Left Hip Flexion/Marching: AAROM;Right;5 reps;Supine Other Exercises Other Exercises: finding midline stability x 2-3 with min to mod assist    General Comments        Pertinent Vitals/Pain Pain Assessment: Faces Faces Pain Scale: Hurts little more Pain Location: R hip Pain Descriptors / Indicators: Guarding Pain Intervention(s): Limited activity within patient's tolerance;Monitored during session;Repositioned;Premedicated before session    Home Living                      Prior Function            PT Goals (current goals can now be found in the care plan section) Progress towards PT goals: Not progressing toward goals - comment (Pt lethargic, decr participation today, decr command follow)    Frequency    Min 3X/week      PT Plan Current plan remains appropriate    Co-evaluation              AM-PAC PT "6 Clicks" Mobility   Outcome Measure  Help needed turning from your back to your side while in a flat bed without using bedrails?: Total Help needed moving from lying on your back to sitting on the side of a flat bed without using bedrails?: Total Help needed moving to and from a bed to a chair (including a wheelchair)?: Total Help needed standing up from a chair using your arms (e.g., wheelchair or bedside chair)?: Total Help needed to walk in hospital room?: Total Help needed climbing 3-5 steps with a railing? : Total 6 Click Score: 6    End of Session   Activity Tolerance: Patient limited by fatigue;Patient  limited by lethargy;Patient limited by pain Patient left: with call bell/phone within reach;with family/visitor present;in chair Nurse Communication: Mobility status;Need for lift equipment PT Visit Diagnosis: Pain;Other abnormalities of gait and mobility (R26.89);Repeated falls (R29.6) Pain - Right/Left: Right Pain - part of body: Hip     Time: 1003-1035 PT Time Calculation (min) (ACUTE ONLY): 32 min  Charges:  $Therapeutic Exercise: 8-22 mins $Therapeutic Activity: 8-22 mins                     Jayion Schneck W,PT Acute Rehabilitation Services Pager:  [phone]  Office:  [place] 11/27/2019, 1:14 PM

## 2019-11-27 NOTE — TOC Progression Note (Signed)
Transition of Care [facility]) - Progression Note    Patient Details  Name: [name] MRN: [id number] Date of Birth: [birth year]  [facility]) CM/SW [place], [place] Phone Number: 11/27/2019, 3:21 PM  Clinical Narrative:    Josem Kaufmann received for [facility], [place] #[id number]. Plan Auth ID #[id number]. Approved 7/28-7/31, alerted MD of authorization and that pt will need a new COVID swab.   Expected Discharge Plan: [place] Barriers to Discharge: Continued Medical Work up  Expected Discharge Plan and Services Expected Discharge Plan: [place] arrangements for the past 2 months: Single Family Home                  Readmission Risk Interventions No flowsheet data found.

## 2019-11-27 NOTE — Progress Notes (Addendum)
[facility] PROGRESS NOTE    Progress Note  [name]  DVV:[id number] DOB: 04-19-1934 DOA: 11/15/2019 PCP: [name], MD     Brief Narrative:   [name] is an 86 y.o. male past medical history of essential hypertension, CAD status post CABG in 2017, CVA TIA, AAA, history of GI bleed carotid stenosis, chronic kidney disease stage IIIa admitted to the hospital secondary to mechanical fall leading to right hip fracture that requires surgical repair on 11/16/2019 started on postop DVT prophylaxis, hospital course became complicated by postop anemia requiring 1 unit of packed red blood cells.  He then developed metabolic encephalopathy with MRI showing an acute CVA, for which neurology was consulted.  EEG was overall unremarkable.  He then developed elevated troponins and cardiology.  Due to his patient encephalopathy his work-up was besides a stroke negative, his Lyrica was taper off, he ended up growing MSSA bacteremia and started on Ancef, his repeated blood cultures remain negative.  Assessment/Plan:   Acute metabolic encephalopathy slow to improve/acute right parietal lobe lobe infarct: CT of the head negative, MRI was positive for CVA. EEG was unremarkable, transcranial Doppler was negative. 2D echo showed preserved EF, he had an A1c of 6.1, LDL 59 TSH within normal limits. LP on 11/22/2019 showed no evidence of infection. His Lyrica has been tapered off, his exam remain nonfocal in nature. His encephalopathy is slowly clearing suspect partially from his underlying infectious etiology. He is at high risk of aspiration pneumonia and delirium, try to keep his head elevated more than 45 degrees. Avoid narcotics as this could worsen his encephalopathy and lead to delirium.  Staph epidermidis bacteremia: Suspect likely due to hip repair. He is intermittently spiking low-grade temperatures, he is currently on Ancef. His leukocytosis is rising with ID will transition  to IV Zyvox. And as an outpatient can be transitioned to oral Zyvox for 2 weeks. 2D echo showed no signs of vegetation. Surveillance cultures have remained negative till date.  Abdominal distention: Initially had nausea and vomiting which is now resolved. Abdominal x-ray is negative for ileus, continue out of bed to chair. Physical therapy and Occupational Therapy have evaluated the patient recommended skilled nursing facility.  Acute blood loss anemia postop: Status post 1 unit of packed red blood cells, Xarelto was held for DVT prophylaxis due to recent drop in hemoglobin. Orthopedic surgery to dictate when to restart anticoagulation for DVT prophylaxis.  Closed right hip fracture status post surgical repair on 11/19/2020: Status post right hip intramedullary nailing. Physical therapy evaluated the patient recommended skilled nursing facility.  Elevated troponin: Likely demand ischemia, cardiology following, 2D echo showed an EF of 60% with grade 2 diastolic heart failure. Now resolved he denies any chest pain or shortness of breath.  Sinus Tachycardia: Improved with IV fluid hydration likely due to hypovolemia.  Autonomic dysfunction: Continue Florinef.  Dysphagia: Continue aspiration precaution, speech is swallowing.  Chronic kidney disease stage IIIa: With creatinine at baseline of 1.4.  Coronary artery disease status post CABG: Currently chest pain-free.  Ambulatory dysfunction: Status post hip repair, physical therapy recommended skilled nursing facility.   DVT prophylaxis: lovenox Family Communication: Wife at bedside Status is: Inpatient  Remains inpatient appropriate because:Hemodynamically unstable   Dispo:  Patient From: Home  Planned Disposition: [place]  Expected discharge date: 11/22/19  Medically stable for discharge: No  Code Status:     Code Status Orders  (From admission, onward)         Start  Ordered   11/15/19  2235  Do not attempt resuscitation (DNR)  Continuous       Question Answer Comment  In the event of cardiac or respiratory ARREST Do not call a "code blue"   In the event of cardiac or respiratory ARREST Do not perform Intubation, CPR, defibrillation or ACLS   In the event of cardiac or respiratory ARREST Use medication by any route, position, wound care, and other measures to relive pain and suffering. May use oxygen, suction and manual treatment of airway obstruction as needed for comfort.      11/15/19 2235        Code Status History    Date Active Date Inactive Code Status Order ID Comments User Context   06/24/2018 1449 06/27/2018 2006 Full Code [id number]  Truett Mainland, DO Inpatient   05/17/2017 1213 05/21/2017 1702 Full Code [id number]  Roxan Hockey, MD ED   02/17/2016 2059 02/20/2016 1545 Full Code [id number]  Karmen Bongo, MD Inpatient   07/03/2015 1530 07/06/2015 1402 Full Code [id number]  Consuelo Pandy, PA-C Inpatient   Advance Care Planning Activity    Advance Directive Documentation     Most Recent Value  Type of Advance Directive Living will  Pre-existing out of facility DNR order (yellow form or pink MOST form) --  "MOST" Form in Place? --        IV Access:    Peripheral IV   Procedures and diagnostic studies:   DG Abd 1 View  Result Date: 11/25/2019 CLINICAL DATA:  Abdominal distension. EXAM: ABDOMEN - 1 VIEW COMPARISON:  None. FINDINGS: The bowel gas pattern is normal. Residual contrast is noted throughout the colon. No radio-opaque calculi or other significant radiographic abnormality are seen. IMPRESSION: No evidence of bowel obstruction or ileus. Electronically Signed   By: Marijo Conception M.D.   On: 11/25/2019 14:31     Medical Consultants:    None.  Anti-Infectives:   Zyvox IV  Subjective:    [name] slow to respond has no new complaints.  Objective:    Vitals:   11/26/19 1155 11/26/19 2032 11/27/19 0019 11/27/19 0413   BP: 125/78 (!) 155/93  (!) 135/72  Pulse: 92 (!) 108  98  Resp: 15 15  15   Temp: 97.8 F (36.6 C) 99 F (37.2 C)  99.2 F (37.3 C)  TempSrc: Oral Oral  Oral  SpO2: 96% 100%  97%  Weight:   91.8 kg   Height:       SpO2: 97 % O2 Flow Rate (L/min): 2 L/min   Intake/Output Summary (Last 24 hours) at 11/27/2019 0752 Last data filed at 11/27/2019 0513 Gross per 24 hour  Intake 1060 ml  Output 725 ml  Net 335 ml   Filed Weights   11/25/19 0404 11/26/19 0434 11/27/19 0019  Weight: (!) 93.4 kg (!) 94.7 kg 91.8 kg    Exam: General exam: In no acute distress. Respiratory system: Good air movement and clear to auscultation. Cardiovascular system: S1 & S2 heard, RRR.  Gastrointestinal system: Abdomen is nondistended, soft and nontender.  Extremities: No pedal edema. Skin: No rashes, lesions or ulcers   Data Reviewed:    Labs: Basic Metabolic Panel: Recent Labs  Lab 11/23/19 0704 11/23/19 0704 11/24/19 0547 11/24/19 0547 11/25/19 0454 11/25/19 0454 11/26/19 0423 11/27/19 0552  NA 143  --  143  --  145  --  144 144  K 3.3*   < > 3.1*   < >  3.2*   < > 3.2* 3.0*  CL 109  --  110  --  110  --  110 109  CO2 25  --  26  --  26  --  26 27  GLUCOSE 130*  --  149*  --  147*  --  151* 146*  BUN 23  --  22  --  23  --  22 24*  CREATININE 1.21  --  1.23  --  1.17  --  1.08 1.23  CALCIUM 8.1*  --  8.0*  --  8.1*  --  8.0* 8.1*  MG 1.9  --  2.0  --  2.1  --  2.0 2.2   < > = values in this interval not displayed.   GFR Estimated Creatinine Clearance: 49.1 mL/min (by C-G formula based on SCr of 1.23 mg/dL). Liver Function Tests: No results for input(s): AST, ALT, ALKPHOS, BILITOT, PROT, ALBUMIN in the last 168 hours. No results for input(s): LIPASE, AMYLASE in the last 168 hours. Recent Labs  Lab 11/20/19 1413  AMMONIA 20   Coagulation profile No results for input(s): INR, PROTIME in the last 168 hours. COVID-19 Labs  No results for input(s): DDIMER, FERRITIN, LDH, CRP  in the last 72 hours.  Lab Results  Component Value Date   SARSCOV2NAA NEGATIVE 11/15/2019   SARSCOV2NAA NOT DETECTED 04/20/2019   Trent Not Detected 09/30/2018    CBC: Recent Labs  Lab 11/23/19 0704 11/23/19 0704 11/24/19 0547 11/24/19 2115 11/25/19 0454 11/26/19 0423 11/27/19 0552  WBC 12.5*  --  11.6*  --  15.1* 17.9* 18.4*  HGB 7.1*   < > 5.6* 9.7* 9.2* 9.4* 8.9*  HCT 22.6*   < > 17.4* 30.8* 28.8* 29.0* 28.1*  MCV 98.7  --  99.4  --  93.5 94.8 94.9  PLT 262  --  355  --  342 389 387   < > = values in this interval not displayed.   Cardiac Enzymes: No results for input(s): CKTOTAL, CKMB, CKMBINDEX, TROPONINI in the last 168 hours. BNP (last 3 results) No results for input(s): PROBNP in the last 8760 hours. CBG: Recent Labs  Lab 11/26/19 1129 11/26/19 1634 11/26/19 2029 11/27/19 0006 11/27/19 0411  GLUCAP 152* 171* 158* 140* 124*   D-Dimer: No results for input(s): DDIMER in the last 72 hours. Hgb A1c: No results for input(s): HGBA1C in the last 72 hours. Lipid Profile: No results for input(s): CHOL, HDL, LDLCALC, TRIG, CHOLHDL, LDLDIRECT in the last 72 hours. Thyroid function studies: No results for input(s): TSH, T4TOTAL, T3FREE, THYROIDAB in the last 72 hours.  Invalid input(s): FREET3 Anemia work up: No results for input(s): VITAMINB12, FOLATE, FERRITIN, TIBC, IRON, RETICCTPCT in the last 72 hours. Sepsis Labs: Recent Labs  Lab 11/21/19 0909 11/22/19 0647 11/24/19 0547 11/25/19 0454 11/26/19 0423 11/27/19 0552  PROCALCITON 0.10  --   --   --   --   --   WBC  --    < > 11.6* 15.1* 17.9* 18.4*   < > = values in this interval not displayed.   Microbiology Recent Results (from the past 240 hour(s))  Culture, blood (Routine X 2) w Reflex to ID Panel     Status: Abnormal   Collection Time: 11/21/19  9:09 AM   Specimen: BLOOD RIGHT HAND  Result Value Ref Range Status   Specimen Description BLOOD RIGHT HAND  Final   Special Requests   Final      BOTTLES DRAWN  AEROBIC ONLY Blood Culture adequate volume   Culture  Setup Time   Final    GRAM POSITIVE COCCI IN CLUSTERS AEROBIC BOTTLE ONLY CRITICAL RESULT CALLED TO, READ BACK BY AND VERIFIED WITH: Sarajane Marek [phone]  Performed at [facility], [address] [place], [place] [address]    Culture STAPHYLOCOCCUS EPIDERMIDIS (A)  Final   Report Status 11/24/2019 FINAL  Final   Organism ID, Bacteria STAPHYLOCOCCUS EPIDERMIDIS  Final      Susceptibility   Staphylococcus epidermidis - MIC*    CIPROFLOXACIN >=8 RESISTANT Resistant     ERYTHROMYCIN <=0.25 SENSITIVE Sensitive     GENTAMICIN <=0.5 SENSITIVE Sensitive     OXACILLIN <=0.25 SENSITIVE Sensitive     TETRACYCLINE <=1 SENSITIVE Sensitive     VANCOMYCIN 1 SENSITIVE Sensitive     TRIMETH/SULFA 160 RESISTANT Resistant     CLINDAMYCIN <=0.25 SENSITIVE Sensitive     RIFAMPIN <=0.5 SENSITIVE Sensitive     Inducible Clindamycin NEGATIVE Sensitive     * STAPHYLOCOCCUS EPIDERMIDIS  Blood Culture ID Panel (Reflexed)     Status: Abnormal   Collection Time: 11/21/19  9:09 AM  Result Value Ref Range Status   Enterococcus species NOT DETECTED NOT DETECTED Final   Listeria monocytogenes NOT DETECTED NOT DETECTED Final   Staphylococcus species DETECTED (A) NOT DETECTED Final    Comment: Methicillin (oxacillin) susceptible coagulase negative staphylococcus. Possible blood culture contaminant (unless isolated from more than one blood culture draw or clinical case suggests pathogenicity). No antibiotic treatment is indicated for blood  culture contaminants. CRITICAL RESULT CALLED TO, READ BACK BY AND VERIFIED WITH: Sarajane Marek [id number] AT [phone] SK     Staphylococcus aureus (BCID) NOT DETECTED NOT DETECTED Final   Methicillin resistance NOT DETECTED NOT DETECTED Final   Streptococcus species NOT DETECTED NOT DETECTED Final   Streptococcus agalactiae NOT DETECTED NOT DETECTED Final   Streptococcus pneumoniae NOT DETECTED  NOT DETECTED Final   Streptococcus pyogenes NOT DETECTED NOT DETECTED Final   Acinetobacter baumannii NOT DETECTED NOT DETECTED Final   Enterobacteriaceae species NOT DETECTED NOT DETECTED Final   Enterobacter cloacae complex NOT DETECTED NOT DETECTED Final   Escherichia coli NOT DETECTED NOT DETECTED Final   Klebsiella oxytoca NOT DETECTED NOT DETECTED Final   Klebsiella pneumoniae NOT DETECTED NOT DETECTED Final   Proteus species NOT DETECTED NOT DETECTED Final   Serratia marcescens NOT DETECTED NOT DETECTED Final   Haemophilus influenzae NOT DETECTED NOT DETECTED Final   Neisseria meningitidis NOT DETECTED NOT DETECTED Final   Pseudomonas aeruginosa NOT DETECTED NOT DETECTED Final   Candida albicans NOT DETECTED NOT DETECTED Final   Candida glabrata NOT DETECTED NOT DETECTED Final   Candida krusei NOT DETECTED NOT DETECTED Final   Candida parapsilosis NOT DETECTED NOT DETECTED Final   Candida tropicalis NOT DETECTED NOT DETECTED Final    Comment: Performed at [facility], [address], [place],  [address]  Culture, blood (Routine X 2) w Reflex to ID Panel     Status: Abnormal   Collection Time: 11/21/19  9:13 AM   Specimen: BLOOD LEFT HAND  Result Value Ref Range Status   Specimen Description BLOOD LEFT HAND  Final   Special Requests   Final    BOTTLES DRAWN AEROBIC ONLY Blood Culture results may not be optimal due to an inadequate volume of blood received in culture bottles   Culture  Setup Time   Final    GRAM POSITIVE  COCCI IN CLUSTERS AEROBIC BOTTLE ONLY CRITICAL VALUE NOTED.  VALUE IS CONSISTENT WITH PREVIOUSLY REPORTED AND CALLED VALUE.    Culture (A)  Final    STAPHYLOCOCCUS EPIDERMIDIS SUSCEPTIBILITIES PERFORMED ON PREVIOUS CULTURE WITHIN THE LAST 5 DAYS. Performed at [facility], [place] [address] [place], [place] [address]    Report Status 11/24/2019 FINAL  Final  Anaerobic culture     Status: None (Preliminary result)   Collection Time:  11/22/19  5:15 PM   Specimen: PATH Cytology CSF; Cerebrospinal Fluid  Result Value Ref Range Status   Specimen Description CSF  Final   Special Requests   Final    NONE Performed at [facility], [place] [address], [place], [place] [address]    Culture   Final    NO ANAEROBES ISOLATED; CULTURE IN PROGRESS FOR 5 DAYS   Report Status PENDING  Incomplete  CSF culture     Status: None   Collection Time: 11/22/19  5:15 PM   Specimen: PATH Cytology CSF; Cerebrospinal Fluid  Result Value Ref Range Status   Specimen Description CSF  Final   Special Requests NONE  Final   Gram Stain   Final    WBC PRESENT, PREDOMINANTLY MONONUCLEAR NO ORGANISMS SEEN CYTOSPIN SMEAR    Culture   Final    NO GROWTH 3 DAYS Performed at [facility], [place] [address], [place], [place] [address]    Report Status 11/26/2019 FINAL  Final  Culture, fungus without smear     Status: None (Preliminary result)   Collection Time: 11/22/19  5:15 PM   Specimen: PATH Cytology CSF; Cerebrospinal Fluid  Result Value Ref Range Status   Specimen Description CSF  Final   Special Requests NONE  Final   Culture   Final    No Fungi Isolated in 4 Weeks Performed at [facility] [address], [place], [place] [address]    Report Status PENDING  Incomplete  Culture, blood (Routine X 2) w Reflex to ID Panel     Status: None (Preliminary result)   Collection Time: 11/24/19  5:45 AM   Specimen: BLOOD  Result Value Ref Range Status   Specimen Description BLOOD RIGHT ANTECUBITAL  Final   Special Requests   Final    BOTTLES DRAWN AEROBIC AND ANAEROBIC Blood Culture adequate volume   Culture   Final    NO GROWTH 2 DAYS Performed at [facility], [place] [address] [place], [place] [address]    Report Status PENDING  Incomplete  Culture, blood (Routine X 2) w Reflex to ID Panel     Status: None (Preliminary result)   Collection Time: 11/24/19  5:46 AM   Specimen: BLOOD LEFT HAND  Result Value Ref Range  Status   Specimen Description BLOOD LEFT HAND  Final   Special Requests   Final    BOTTLES DRAWN AEROBIC AND ANAEROBIC Blood Culture adequate volume   Culture   Final    NO GROWTH 2 DAYS Performed at [facility], [place] [address], [place], [place] [address]    Report Status PENDING  Incomplete     Medications:   . aspirin EC  81 mg Oral Daily  . atorvastatin  80 mg Oral Daily  . docusate sodium  100 mg Oral BID  . feeding supplement (ENSURE ENLIVE)  237 mL Oral BID BM  . fludrocortisone  0.1 mg Oral Daily  . insulin aspart  0-9 Units Subcutaneous  Q4H  . multivitamin with minerals  1 tablet Oral Daily  . pantoprazole  40 mg Oral Daily  . polyethylene glycol  17 g Oral Daily   Continuous Infusions: . sodium chloride 50 mL/hr at 11/26/19 2303  .  ceFAZolin (ANCEF) IV 2 g (11/27/19 0513)      LOS: 12 days   Charlynne Cousins  [facility]  11/27/2019, 7:52 AM

## 2019-11-27 NOTE — Progress Notes (Signed)
Nutrition Follow-up  DOCUMENTATION CODES:   Not applicable  INTERVENTION:   -Continue MVI with minerals daily -Continue Ensure Enlive po BID, each supplement provides 350 kcal and 20 grams of protein (thicken to honey consistency) -Continue Magic cup TID with meals, each supplement provides 290 kcal and 9 grams of protein  NUTRITION DIAGNOSIS:   Inadequate oral intake related to dysphagia as evidenced by meal completion < 25%.  Ongoing  GOAL:   Patient will meet greater than or equal to 90% of their needs  Progressing  MONITOR:   PO intake, Supplement acceptance, Diet advancement, Weight trends, Skin, Labs, I & O's  REASON FOR ASSESSMENT:   Consult Assessment of nutrition requirement/status  ASSESSMENT:   [name] is a 86 y.o. male with a history of diet-controlled type 2 diabetes with stage III chronic kidney disease and diabetic neuropathy, coronary artery disease status post bypass, CVA.  Patient presents with right hip pain after a mechanical fall with the patient tripping as he was trying to get out of the car.  7/17- s/p Procedure(s): RIGHT HIP INTRAMEDULLARY (IM) NAIL FEMORAL 7/20- s/p MBSS- advanced to dysphagia 3 diet with honey thick liquids 7/23- s/p lumbar puncture 7/26- downgraded to dysphagia 2 diet with honey thick liquids  Reviewed I/O's: +45 ml x 24 hours and +4.2 L since admission  UOP: 1.1 L x 24 hours  Attempted to speak with pt via phone, however, no answer.   Intake has improved since diet downgrade. Noted meal completion 50-85%. Pt is consuming thickened Ensure Enlive supplements per MAR.   Therapies recommending SNF placement at discharge.   Labs reviewed: CBGS: 124-171 (inpatient orders for glycemic control are 0-9 units insulin aspart every 4 hours).   Diet Order:   Diet Order            DIET DYS 2 Room service appropriate? Yes with Assist; Fluid consistency: Honey Thick  Diet effective now                 EDUCATION  NEEDS:   Education needs have been addressed  Skin:  Skin Assessment: Skin Integrity Issues: Skin Integrity Issues:: Incisions Incisions: closed rt hip, chest  Last BM:  11/26/19  Height:   Ht Readings from Last 1 Encounters:  11/15/19 6' (1.829 m)    Weight:   Wt Readings from Last 1 Encounters:  11/27/19 91.8 kg    Ideal Body Weight:  80.9 kg  BMI:  Body mass index is 27.44 kg/m.  Estimated Nutritional Needs:   Kcal:  2200-2400  Protein:  90-105 grams  Fluid:  > 2.2 L    Loistine Chance, RD, LDN, [place] Registered Dietitian II Certified Diabetes Care and Education Specialist Please refer to [facility] for RD and/or RD on-call/weekend/after hours pager

## 2019-11-28 DIAGNOSIS — S72101A Unspecified trochanteric fracture of right femur, initial encounter for closed fracture: Secondary | ICD-10-CM | POA: Diagnosis not present

## 2019-11-28 DIAGNOSIS — G903 Multi-system degeneration of the autonomic nervous system: Secondary | ICD-10-CM | POA: Diagnosis not present

## 2019-11-28 DIAGNOSIS — S72001G Fracture of unspecified part of neck of right femur, subsequent encounter for closed fracture with delayed healing: Secondary | ICD-10-CM | POA: Diagnosis not present

## 2019-11-28 DIAGNOSIS — B957 Other staphylococcus as the cause of diseases classified elsewhere: Secondary | ICD-10-CM

## 2019-11-28 DIAGNOSIS — E1122 Type 2 diabetes mellitus with diabetic chronic kidney disease: Secondary | ICD-10-CM | POA: Diagnosis not present

## 2019-11-28 LAB — GLUCOSE, CAPILLARY
Glucose-Capillary: 117 mg/dL — ABNORMAL HIGH (ref 70–99)
Glucose-Capillary: 133 mg/dL — ABNORMAL HIGH (ref 70–99)
Glucose-Capillary: 134 mg/dL — ABNORMAL HIGH (ref 70–99)
Glucose-Capillary: 145 mg/dL — ABNORMAL HIGH (ref 70–99)
Glucose-Capillary: 156 mg/dL — ABNORMAL HIGH (ref 70–99)
Glucose-Capillary: 157 mg/dL — ABNORMAL HIGH (ref 70–99)

## 2019-11-28 LAB — BASIC METABOLIC PANEL
Anion gap: 8 (ref 5–15)
BUN: 29 mg/dL — ABNORMAL HIGH (ref 8–23)
CO2: 26 mmol/L (ref 22–32)
Calcium: 8.3 mg/dL — ABNORMAL LOW (ref 8.9–10.3)
Chloride: 111 mmol/L (ref 98–111)
Creatinine, Ser: 1.27 mg/dL — ABNORMAL HIGH (ref 0.61–1.24)
GFR calc Af Amer: 60 mL/min — ABNORMAL LOW (ref >60)
GFR calc non Af Amer: 52 mL/min — ABNORMAL LOW (ref >60)
Glucose, Bld: 131 mg/dL — ABNORMAL HIGH (ref 70–99)
Potassium: 3.6 mmol/L (ref 3.5–5.1)
Sodium: 145 mmol/L (ref 135–145)

## 2019-11-28 LAB — CBC
HCT: 28.9 % — ABNORMAL LOW (ref 39.0–52.0)
Hemoglobin: 9.1 g/dL — ABNORMAL LOW (ref 13.0–17.0)
MCH: 30.2 pg (ref 26.0–34.0)
MCHC: 31.5 g/dL (ref 30.0–36.0)
MCV: 96 fL (ref 80.0–100.0)
Platelets: 430 10*3/uL — ABNORMAL HIGH (ref 150–400)
RBC: 3.01 MIL/uL — ABNORMAL LOW (ref 4.22–5.81)
RDW: 16.6 % — ABNORMAL HIGH (ref 11.5–15.5)
WBC: 16.5 10*3/uL — ABNORMAL HIGH (ref 4.0–10.5)
nRBC: 0 % (ref 0.0–0.2)

## 2019-11-28 LAB — MAGNESIUM: Magnesium: 2.4 mg/dL (ref 1.7–2.4)

## 2019-11-28 MED ORDER — RIVAROXABAN 10 MG PO TABS
10.0000 mg | ORAL_TABLET | Freq: Every day | ORAL | Status: DC
  Administered 2019-11-28 – 2019-11-30 (×3): 10 mg via ORAL
  Filled 2019-11-28 (×4): qty 1

## 2019-11-28 NOTE — Progress Notes (Signed)
[facility] PROGRESS NOTE    Progress Note  [name]  GBT:[id number] DOB: Jan 31, 1934 DOA: 11/15/2019 PCP: [name], MD     Brief Narrative:   [name] is an 86 y.o. male past medical history of essential hypertension, CAD status post CABG in 2017, CVA TIA, AAA, history of GI bleed carotid stenosis, chronic kidney disease stage IIIa admitted to the hospital secondary to mechanical fall leading to right hip fracture that requires surgical repair on 11/16/2019 started on postop DVT prophylaxis, hospital course became complicated by postop anemia requiring 1 unit of packed red blood cells.  He then developed metabolic encephalopathy with MRI showing an acute CVA, for which neurology was consulted.  EEG was overall unremarkable.  He then developed elevated troponins and cardiology.  Due to his patient encephalopathy his work-up was besides a stroke negative, his Lyrica was taper off, he ended up growing MSSA bacteremia and started on Ancef, his repeated blood cultures remain negative.  Assessment/Plan:   Acute metabolic encephalopathy slow to improve/acute right parietal lobe lobe infarct: CT of the head negative, MRI was positive for CVA. EEG was unremarkable, transcranial Doppler was negative. 2D echo showed preserved EF, he had an A1c of 6.1, LDL 59 TSH within normal limits. LP on 11/22/2019 showed no evidence of infection. He is off Lyrica. His encephalopathy is completely resolved likely due to underlying infectious etiology. He needs to be high risk of aspiration and delirium try to keep his head elevated greater than 45 degrees. Avoid narcotics or benzodiazepine as it could worsen his mental state.  Staph epidermidis bacteremia: Suspect likely due to hip repair. He was initially started on IV Ancef but he continues to spike fever and leukocytosis is rising it was discussed with ID. He was transitioned to IV Zyvox he has defervesced and leukocytosis  improving. He will need 2 weeks of oral Zyvox as an outpatient. 2D echo showed no signs of vegetation. Surveillance cultures have remained negative till date.  Abdominal distention: Initially had nausea and vomiting which is now resolved. Abdominal x-ray is negative for ileus, continue out of bed to chair. Physical therapy and Occupational Therapy have evaluated the patient recommended skilled nursing facility.  Acute blood loss anemia postop: Status post 1 unit of packed red blood cells, Xarelto was held for DVT prophylaxis due to recent drop in hemoglobin. Orthopedic surgery to dictate when to restart anticoagulation for DVT prophylaxis.  Closed right hip fracture status post surgical repair on 11/19/2020: Status post right hip intramedullary nailing. Physical therapy evaluated the patient recommended skilled nursing facility.  Elevated troponin: Likely demand ischemia, cardiology following, 2D echo showed an EF of 60% with grade 2 diastolic heart failure. Now resolved he denies any chest pain or shortness of breath.  Sinus Tachycardia: Improved with IV fluid hydration likely due to hypovolemia.  Autonomic dysfunction: Continue Florinef.  Dysphagia: Continue aspiration precaution, speech is swallowing.  Chronic kidney disease stage IIIa: Continues to be at baseline KVO IV fluid he is tolerating his oral diet.  Coronary artery disease status post CABG: Currently chest pain-free.  Ambulatory dysfunction: Status post hip repair, physical therapy recommended skilled nursing facility.   DVT prophylaxis: lovenox Family Communication: Wife at bedside Status is: Inpatient  Remains inpatient appropriate because:Hemodynamically unstable   Dispo:  Patient From: Home  Planned Disposition: [place]  Expected discharge date: 11/29/19  Medically stable for discharge: No  Code Status:     Code Status Orders  (From admission, onward)  Start      Ordered   11/15/19 2235  Do not attempt resuscitation (DNR)  Continuous       Question Answer Comment  In the event of cardiac or respiratory ARREST Do not call a "code blue"   In the event of cardiac or respiratory ARREST Do not perform Intubation, CPR, defibrillation or ACLS   In the event of cardiac or respiratory ARREST Use medication by any route, position, wound care, and other measures to relive pain and suffering. May use oxygen, suction and manual treatment of airway obstruction as needed for comfort.      11/15/19 2235        Code Status History    Date Active Date Inactive Code Status Order ID Comments User Context   06/24/2018 1449 06/27/2018 2006 Full Code [id number]  Truett Mainland, DO Inpatient   05/17/2017 1213 05/21/2017 1702 Full Code [id number]  Roxan Hockey, MD ED   02/17/2016 2059 02/20/2016 1545 Full Code [id number]  Karmen Bongo, MD Inpatient   07/03/2015 1530 07/06/2015 1402 Full Code [id number]  Consuelo Pandy, PA-C Inpatient   Advance Care Planning Activity    Advance Directive Documentation     Most Recent Value  Type of Advance Directive Living will  Pre-existing out of facility DNR order (yellow form or pink MOST form) --  "MOST" Form in Place? --        IV Access:    Peripheral IV   Procedures and diagnostic studies:   No results found.   Medical Consultants:    None.  Anti-Infectives:   Zyvox IV  Subjective:    [name] [place] responsive this morning and very talkative.  Objective:    Vitals:   11/27/19 1208 11/27/19 2008 11/28/19 0100 11/28/19 0405  BP: (!) 130/79 (!) 143/92  (!) 142/82  Pulse: 95 100  93  Resp: 18 18  18   Temp: 98.9 F (37.2 C) 99.5 F (37.5 C)  99.4 F (37.4 C)  TempSrc: Oral Oral  Oral  SpO2: 98% 100%  98%  Weight:   (!) 97.3 kg   Height:       SpO2: 98 % O2 Flow Rate (L/min): 2 L/min   Intake/Output Summary (Last 24 hours) at 11/28/2019 0850 Last data filed at 11/28/2019 0408 Gross per  24 hour  Intake 640 ml  Output 575 ml  Net 65 ml   Filed Weights   11/26/19 0434 11/27/19 0019 11/28/19 0100  Weight: (!) 94.7 kg 91.8 kg (!) 97.3 kg    Exam: General exam: In no acute distress. Respiratory system: Good air movement and clear to auscultation. Cardiovascular system: S1 & S2 heard, RRR. No JVD. Gastrointestinal system: Abdomen is nondistended, soft and nontender.  Extremities: No pedal edema. Skin: No rashes, lesions or ulcers Psychiatry: Judgement and insight appear normal. Mood & affect appropriate.   Data Reviewed:    Labs: Basic Metabolic Panel: Recent Labs  Lab 11/24/19 0547 11/24/19 0547 11/25/19 0454 11/25/19 0454 11/26/19 0423 11/26/19 0423 11/27/19 0552 11/28/19 0425  NA 143  --  145  --  144  --  144 145  K 3.1*   < > 3.2*   < > 3.2*   < > 3.0* 3.6  CL 110  --  110  --  110  --  109 111  CO2 26  --  26  --  26  --  27 26  GLUCOSE 149*  --  147*  --  151*  --  146* 131*  BUN 22  --  23  --  22  --  24* 29*  CREATININE 1.23  --  1.17  --  1.08  --  1.23 1.27*  CALCIUM 8.0*  --  8.1*  --  8.0*  --  8.1* 8.3*  MG 2.0  --  2.1  --  2.0  --  2.2 2.4   < > = values in this interval not displayed.   GFR Estimated Creatinine Clearance: 52.4 mL/min (A) (by C-G formula based on SCr of 1.27 mg/dL (H)). Liver Function Tests: No results for input(s): AST, ALT, ALKPHOS, BILITOT, PROT, ALBUMIN in the last 168 hours. No results for input(s): LIPASE, AMYLASE in the last 168 hours. No results for input(s): AMMONIA in the last 168 hours. Coagulation profile No results for input(s): INR, PROTIME in the last 168 hours. COVID-19 Labs  No results for input(s): DDIMER, FERRITIN, LDH, CRP in the last 72 hours.  Lab Results  Component Value Date   SARSCOV2NAA NEGATIVE 11/27/2019   [place] NEGATIVE 11/15/2019   SARSCOV2NAA NOT DETECTED 04/20/2019   [place] Not Detected 09/30/2018    CBC: Recent Labs  Lab 11/24/19 0547 11/24/19 0547  11/24/19 2115 11/25/19 0454 11/26/19 0423 11/27/19 0552 11/28/19 0425  WBC 11.6*  --   --  15.1* 17.9* 18.4* 16.5*  HGB 5.6*   < > 9.7* 9.2* 9.4* 8.9* 9.1*  HCT 17.4*   < > 30.8* 28.8* 29.0* 28.1* 28.9*  MCV 99.4  --   --  93.5 94.8 94.9 96.0  PLT 355  --   --  342 389 387 430*   < > = values in this interval not displayed.   Cardiac Enzymes: No results for input(s): CKTOTAL, CKMB, CKMBINDEX, TROPONINI in the last 168 hours. BNP (last 3 results) No results for input(s): PROBNP in the last 8760 hours. CBG: Recent Labs  Lab 11/27/19 1720 11/27/19 2022 11/28/19 0042 11/28/19 0359 11/28/19 0736  GLUCAP 119* 152* 134* 117* 133*   D-Dimer: No results for input(s): DDIMER in the last 72 hours. Hgb A1c: No results for input(s): HGBA1C in the last 72 hours. Lipid Profile: No results for input(s): CHOL, HDL, LDLCALC, TRIG, CHOLHDL, LDLDIRECT in the last 72 hours. Thyroid function studies: No results for input(s): TSH, T4TOTAL, T3FREE, THYROIDAB in the last 72 hours.  Invalid input(s): FREET3 Anemia work up: No results for input(s): VITAMINB12, FOLATE, FERRITIN, TIBC, IRON, RETICCTPCT in the last 72 hours. Sepsis Labs: Recent Labs  Lab 11/21/19 0909 11/22/19 0647 11/25/19 0454 11/26/19 0423 11/27/19 0552 11/28/19 0425  PROCALCITON 0.10  --   --   --   --   --   WBC  --    < > 15.1* 17.9* 18.4* 16.5*   < > = values in this interval not displayed.   Microbiology Recent Results (from the past 240 hour(s))  Culture, blood (Routine X 2) w Reflex to ID Panel     Status: Abnormal   Collection Time: 11/21/19  9:09 AM   Specimen: BLOOD RIGHT HAND  Result Value Ref Range Status   Specimen Description BLOOD RIGHT HAND  Final   Special Requests   Final    BOTTLES DRAWN AEROBIC ONLY Blood Culture adequate volume   Culture  Setup Time   Final    GRAM POSITIVE COCCI IN CLUSTERS AEROBIC BOTTLE ONLY CRITICAL RESULT CALLED TO, READ BACK BY AND VERIFIED WITH: Sarajane Marek [id number]  [id number] SK  Performed at [facility]  Lab, [address] [place], [place] [address]    Culture STAPHYLOCOCCUS EPIDERMIDIS (A)  Final   Report Status 11/24/2019 FINAL  Final   Organism ID, Bacteria STAPHYLOCOCCUS EPIDERMIDIS  Final      Susceptibility   Staphylococcus epidermidis - MIC*    CIPROFLOXACIN >=8 RESISTANT Resistant     ERYTHROMYCIN <=0.25 SENSITIVE Sensitive     GENTAMICIN <=0.5 SENSITIVE Sensitive     OXACILLIN <=0.25 SENSITIVE Sensitive     TETRACYCLINE <=1 SENSITIVE Sensitive     VANCOMYCIN 1 SENSITIVE Sensitive     TRIMETH/SULFA 160 RESISTANT Resistant     CLINDAMYCIN <=0.25 SENSITIVE Sensitive     RIFAMPIN <=0.5 SENSITIVE Sensitive     Inducible Clindamycin NEGATIVE Sensitive     * STAPHYLOCOCCUS EPIDERMIDIS  Blood Culture ID Panel (Reflexed)     Status: Abnormal   Collection Time: 11/21/19  9:09 AM  Result Value Ref Range Status   Enterococcus species NOT DETECTED NOT DETECTED Final   Listeria monocytogenes NOT DETECTED NOT DETECTED Final   Staphylococcus species DETECTED (A) NOT DETECTED Final    Comment: Methicillin (oxacillin) susceptible coagulase negative staphylococcus. Possible blood culture contaminant (unless isolated from more than one blood culture draw or clinical case suggests pathogenicity). No antibiotic treatment is indicated for blood  culture contaminants. CRITICAL RESULT CALLED TO, READ BACK BY AND VERIFIED WITH: Sarajane Marek [id number] AT 1034 SK     Staphylococcus aureus (BCID) NOT DETECTED NOT DETECTED Final   Methicillin resistance NOT DETECTED NOT DETECTED Final   Streptococcus species NOT DETECTED NOT DETECTED Final   Streptococcus agalactiae NOT DETECTED NOT DETECTED Final   Streptococcus pneumoniae NOT DETECTED NOT DETECTED Final   Streptococcus pyogenes NOT DETECTED NOT DETECTED Final   Acinetobacter baumannii NOT DETECTED NOT DETECTED Final   Enterobacteriaceae species NOT DETECTED NOT DETECTED Final   Enterobacter cloacae complex NOT  DETECTED NOT DETECTED Final   Escherichia coli NOT DETECTED NOT DETECTED Final   Klebsiella oxytoca NOT DETECTED NOT DETECTED Final   Klebsiella pneumoniae NOT DETECTED NOT DETECTED Final   Proteus species NOT DETECTED NOT DETECTED Final   Serratia marcescens NOT DETECTED NOT DETECTED Final   Haemophilus influenzae NOT DETECTED NOT DETECTED Final   Neisseria meningitidis NOT DETECTED NOT DETECTED Final   Pseudomonas aeruginosa NOT DETECTED NOT DETECTED Final   Candida albicans NOT DETECTED NOT DETECTED Final   Candida glabrata NOT DETECTED NOT DETECTED Final   Candida krusei NOT DETECTED NOT DETECTED Final   Candida parapsilosis NOT DETECTED NOT DETECTED Final   Candida tropicalis NOT DETECTED NOT DETECTED Final    Comment: Performed at [facility], [address] [place], [place] [address]  Culture, blood (Routine X 2) w Reflex to ID Panel     Status: Abnormal   Collection Time: 11/21/19  9:13 AM   Specimen: BLOOD LEFT HAND  Result Value Ref Range Status   Specimen Description BLOOD LEFT HAND  Final   Special Requests   Final    BOTTLES DRAWN AEROBIC ONLY Blood Culture results may not be optimal due to an inadequate volume of blood received in culture bottles   Culture  Setup Time   Final    GRAM POSITIVE COCCI IN CLUSTERS AEROBIC BOTTLE ONLY CRITICAL VALUE NOTED.  VALUE IS CONSISTENT WITH PREVIOUSLY REPORTED AND CALLED VALUE.    Culture (A)  Final    STAPHYLOCOCCUS EPIDERMIDIS SUSCEPTIBILITIES PERFORMED ON PREVIOUS CULTURE WITHIN THE LAST 5 DAYS. Performed at [facility] Lab, [address] [place], [place] [address]    Report Status 11/24/2019 FINAL  Final  Anaerobic culture     Status: None   Collection Time: 11/22/19  5:15 PM   Specimen: PATH Cytology CSF; Cerebrospinal Fluid  Result Value Ref Range Status   Specimen Description CSF  Final   Special Requests NONE  Final   Culture   Final    NO ANAEROBES ISOLATED Performed at [facility], [place]  [address], [place], [place] [address]    Report Status 11/27/2019 FINAL  Final  CSF culture     Status: None   Collection Time: 11/22/19  5:15 PM   Specimen: PATH Cytology CSF; Cerebrospinal Fluid  Result Value Ref Range Status   Specimen Description CSF  Final   Special Requests NONE  Final   Gram Stain   Final    WBC PRESENT, PREDOMINANTLY MONONUCLEAR NO ORGANISMS SEEN CYTOSPIN SMEAR    Culture   Final    NO GROWTH 3 DAYS Performed at [facility], [place] [address], [place], [place] [address]    Report Status 11/26/2019 FINAL  Final  Culture, fungus without smear     Status: None (Preliminary result)   Collection Time: 11/22/19  5:15 PM   Specimen: PATH Cytology CSF; Cerebrospinal Fluid  Result Value Ref Range Status   Specimen Description CSF  Final   Special Requests NONE  Final   Culture   Final    NO FUNGUS ISOLATED AFTER 5 DAYS Performed at [facility], [place] [address] [place], [place] [address]    Report Status PENDING  Incomplete  Culture, blood (Routine X 2) w Reflex to ID Panel     Status: None (Preliminary result)   Collection Time: 11/24/19  5:45 AM   Specimen: BLOOD  Result Value Ref Range Status   Specimen Description BLOOD RIGHT ANTECUBITAL  Final   Special Requests   Final    BOTTLES DRAWN AEROBIC AND ANAEROBIC Blood Culture adequate volume   Culture   Final    NO GROWTH 2 DAYS Performed at [facility], [place] [address] [place], [place] [address]    Report Status PENDING  Incomplete  Culture, blood (Routine X 2) w Reflex to ID Panel     Status: None (Preliminary result)   Collection Time: 11/24/19  5:46 AM   Specimen: BLOOD LEFT HAND  Result Value Ref Range Status   Specimen Description BLOOD LEFT HAND  Final   Special Requests   Final    BOTTLES DRAWN AEROBIC AND ANAEROBIC Blood Culture adequate volume   Culture   Final    NO GROWTH 2 DAYS Performed at [facility], [place] [address], [place],  [address]    Report Status  PENDING  Incomplete  SARS Coronavirus 2 by RT PCR (hospital order, performed in [facility] hospital lab) Nasopharyngeal Nasopharyngeal Swab     Status: None   Collection Time: 11/27/19  5:20 PM   Specimen: Nasopharyngeal Swab  Result Value Ref Range Status   SARS Coronavirus 2 NEGATIVE NEGATIVE Final    Comment: (NOTE) SARS-CoV-2 target nucleic acids are NOT DETECTED.  The SARS-CoV-2 RNA is generally detectable in upper and lower respiratory specimens during the acute phase of infection. The lowest concentration of SARS-CoV-2 viral copies this assay can detect is 250 copies / mL. A negative result does not preclude SARS-CoV-2 infection and should not be used as the sole basis for treatment or other  patient management decisions.  A negative result may occur with improper specimen collection / handling, submission of specimen other than nasopharyngeal swab, presence of viral mutation(s) within the areas targeted by this assay, and inadequate number of viral copies (<250 copies / mL). A negative result must be combined with clinical observations, patient history, and epidemiological information.  Fact Sheet for Patients:   StrictlyIdeas.no  Fact Sheet for Healthcare Providers: BankingDealers.co.za  This test is not yet approved or  cleared by the [place] FDA and has been authorized for detection and/or diagnosis of SARS-CoV-2 by FDA under an Emergency Use Authorization (EUA).  This EUA will remain in effect (meaning this test can be used) for the duration of the COVID-19 declaration under Section 564(b)(1) of the Act, 21 U.S.C. section 360bbb-3(b)(1), unless the authorization is terminated or revoked sooner.  Performed at [facility], [place] [address], [place], [place] [address]      Medications:   . aspirin EC  81 mg Oral Daily  . atorvastatin  80 mg Oral Daily  . docusate sodium  100 mg Oral BID  . feeding  supplement (ENSURE ENLIVE)  237 mL Oral BID BM  . fludrocortisone  0.1 mg Oral Daily  . insulin aspart  0-9 Units Subcutaneous Q4H  . multivitamin with minerals  1 tablet Oral Daily  . pantoprazole  40 mg Oral Daily  . polyethylene glycol  17 g Oral Daily   Continuous Infusions: . sodium chloride 50 mL/hr at 11/26/19 2303  . linezolid (ZYVOX) IV 600 mg (11/27/19 2008)      LOS: 13 days   Charlynne Cousins  [facility]  11/28/2019, 8:50 AM

## 2019-11-28 NOTE — TOC Progression Note (Signed)
Transition of Care [facility]) - Progression Note    Patient Details  Name: JSIAH MENTA MRN: [id number] Date of Birth: 1933-10-16  Transition of Care [facility]) CM/SW [place], [place] Phone Number: 11/28/2019, 2:45 PM  Clinical Narrative:     Insurance authorization has been approved. Patient has SNF bed at [facility] when medically ready for discharge.  TOC team will continue to follow.  Expected Discharge Plan: [place] Barriers to Discharge: Continued Medical Work up  Expected Discharge Plan and Services Expected Discharge Plan: [place] arrangements for the past 2 months: Single Family Home                                       Social Determinants of Health (SDOH) Interventions    Readmission Risk Interventions No flowsheet data found.

## 2019-11-28 NOTE — TOC Progression Note (Signed)
Transition of Care [facility]) - Progression Note    Patient Details  Name: [name] MRN: [id number] Date of Birth: [birth year]  Transition of Care [facility]) CM/SW [place], [place] Phone Number: 11/28/2019, 3:26 PM  Clinical Narrative:     CSW spoke with patients wife at bedside who requested that patient go to [place]. CSW called [facility] to see if they could offer a bed for patient. Facility confirmed they can accept patient for SNF placement. CSW called [employer] to change facility choice. [employer] will have to start another insurance authorization for patient. CSW resubmitted clinicals for review reference number [id number]. CSW requested start date for today.  Patient has SN[facility] bed at [facility]. Insurance authorization pending.  TOC team will continue to follow.  Expected Discharge Plan: [place] Barriers to Discharge: Continued Medical Work up  Expected Discharge Plan and Services Expected Discharge Plan: [place] arrangements for the past 2 months: Single Family Home                                       Social Determinants of Health (SDOH) Interventions    Readmission Risk Interventions No flowsheet data found.

## 2019-11-28 NOTE — Progress Notes (Signed)
  Speech Language Pathology Treatment: Dysphagia  Patient Details Name: [name] MRN: [id number] DOB: Nov 21, 1933 Today's Date: 11/28/2019 Time: 8295-6213 SLP Time Calculation (min) (ACUTE ONLY): 20 min  Assessment / Plan / Recommendation Clinical Impression  Pt seen at bedside to assess appropriateness for advanced textures. Pt was seated upright in recliner. Wife present. Pt continues to present with increasing alertness, as well as improved strength and endurance. Wife reports minimal intake due to dislike of current (Dys2/HTL) diet. SLP provided education regarding rationale for downgrade to dys 2 (taking 2 hours to finish a meal and consequent increase in aspiration risk), and honey thick liquids (airway compromise of all liquids tested on 11/19/19 MBS, as well as lethargy and poor alertness at that time).   SLP provided trials of nectar thick liquid, which appeared to be tolerated well - no cough response elicited. Pt was then given thin liquid trials with immediate cough response noted. At this time, will continue dys 2 solids but advance to nectar thick liquids. Will schedule repeat MBS for Friday 11/29/19 to re-evaluate swallow function and safety and to identify readiness for advanced textures. RN and MD aware.    HPI HPI:  86 y.o. male with a history of diet-controlled type 2 diabetes with stage III chronic kidney disease and diabetic neuropathy, coronary artery disease status post bypass, CVA.  Patient presents with right hip fx after a mechanical fall.  Underwent right hip IM nail placement 7/17. Now with acute metabolic encephalopathy.  [name] present, she reports hx of increasing swallowing difficulty in the last several months, with coughing/choking during meals.       SLP Plan  MBS       Recommendations  Diet recommendations: Dysphagia 2 (fine chop);Nectar-thick liquid Liquids provided via: Cup;Straw Medication Administration: Whole meds with puree Supervision: Full  supervision/cueing for compensatory strategies;Staff to assist with self feeding Compensations: Slow rate;Small sips/bites;Minimize environmental distractions Postural Changes and/or Swallow Maneuvers: Seated upright 90 degrees;Upright 30-60 min after meal                Oral Care Recommendations: Staff/trained caregiver to provide oral care;Oral care QID Follow up Recommendations: Skilled Nursing facility;24 hour supervision/assistance SLP Visit Diagnosis: Dysphagia, oropharyngeal phase (R13.12) Plan: MBS       GO               Shimshon Narula B. Quentin Ore, [facility], [place] Speech Language Pathologist Office: [phone] Pager: [phone]  Shonna Chock 11/28/2019, 3:04 PM

## 2019-11-28 NOTE — Progress Notes (Signed)
ANTICOAGULATION CONSULT NOTE - Follow Up Consult  Pharmacy Consult for Xarelto  Indication: VTE prophylaxis after hip surgery  Allergies  Allergen Reactions  . Other Other (See Comments)    Pt reports allergic to a medication but unsure of name or type of med  . Cymbalta [Duloxetine Hcl] Other (See Comments)    Dizziness, hallucinations.   . Keflex [Cephalexin] Other (See Comments)    dizziness  . Simvastatin Other (See Comments)    Joint pain  . Sudafed [Pseudoephedrine] Other (See Comments)    Dizziness    Patient Measurements: Height: 6' (182.9 cm) Weight: (!) 97.3 kg (214 lb 8.1 oz) IBW/kg (Calculated) : 77.6  Vital Signs: Temp: 99.4 F (37.4 C) (07/29 0405) Temp Source: Oral (07/29 0405) BP: 142/82 (07/29 0405) Pulse Rate: 93 (07/29 0405)  Labs: Recent Labs    11/26/19 0423 11/26/19 0423 11/27/19 0552 11/28/19 0425  HGB 9.4*   < > 8.9* 9.1*  HCT 29.0*  --  28.1* 28.9*  PLT 389  --  387 430*  CREATININE 1.08  --  1.23 1.27*   < > = values in this interval not displayed.    Estimated Creatinine Clearance: 52.4 mL/min (A) (by C-G formula based on SCr of 1.27 mg/dL (H)).   Medications:  Scheduled:  . aspirin EC  81 mg Oral Daily  . atorvastatin  80 mg Oral Daily  . docusate sodium  100 mg Oral BID  . feeding supplement (ENSURE ENLIVE)  237 mL Oral BID BM  . fludrocortisone  0.1 mg Oral Daily  . insulin aspart  0-9 Units Subcutaneous Q4H  . multivitamin with minerals  1 tablet Oral Daily  . pantoprazole  40 mg Oral Daily  . polyethylene glycol  17 g Oral Daily  . rivaroxaban  10 mg Oral Daily    Assessment: Pt was on Xarelto for VTE ppx s/p right hip IM. Ortho recommended 3 weeks of therapy. Xarelto was held for anemia on 7/25, s/p 1 unit RBC transfusion for Hgb 5.6. Now H/H are 9.1/28.9, PLT WNL, no s/sx bleeding charted. Followed up with ortho and they agreed to restart low dose Xarelto now that pt is stable.  Goal of Therapy:  Monitor H/H, PLT,  s/sx bleeding  Plan:  Restart Xarelto 10mg  daily for VTE ppx until 8/9 (3 weeks total)  Mercy Riding, PharmD PGY1 Acute Care Pharmacy Resident Please refer to [facility] for unit-specific pharmacist

## 2019-11-29 ENCOUNTER — Inpatient Hospital Stay (HOSPITAL_COMMUNITY): Payer: Medicare Other

## 2019-11-29 DIAGNOSIS — E1122 Type 2 diabetes mellitus with diabetic chronic kidney disease: Secondary | ICD-10-CM | POA: Diagnosis not present

## 2019-11-29 DIAGNOSIS — S72001G Fracture of unspecified part of neck of right femur, subsequent encounter for closed fracture with delayed healing: Secondary | ICD-10-CM | POA: Diagnosis not present

## 2019-11-29 DIAGNOSIS — R7881 Bacteremia: Secondary | ICD-10-CM | POA: Diagnosis not present

## 2019-11-29 DIAGNOSIS — S72101A Unspecified trochanteric fracture of right femur, initial encounter for closed fracture: Secondary | ICD-10-CM | POA: Diagnosis not present

## 2019-11-29 LAB — CBC
HCT: 29.8 % — ABNORMAL LOW (ref 39.0–52.0)
Hemoglobin: 9 g/dL — ABNORMAL LOW (ref 13.0–17.0)
MCH: 29.4 pg (ref 26.0–34.0)
MCHC: 30.2 g/dL (ref 30.0–36.0)
MCV: 97.4 fL (ref 80.0–100.0)
Platelets: 473 10*3/uL — ABNORMAL HIGH (ref 150–400)
RBC: 3.06 MIL/uL — ABNORMAL LOW (ref 4.22–5.81)
RDW: 16.4 % — ABNORMAL HIGH (ref 11.5–15.5)
WBC: 14.5 10*3/uL — ABNORMAL HIGH (ref 4.0–10.5)
nRBC: 0 % (ref 0.0–0.2)

## 2019-11-29 LAB — BASIC METABOLIC PANEL
Anion gap: 9 (ref 5–15)
BUN: 30 mg/dL — ABNORMAL HIGH (ref 8–23)
CO2: 27 mmol/L (ref 22–32)
Calcium: 8.2 mg/dL — ABNORMAL LOW (ref 8.9–10.3)
Chloride: 110 mmol/L (ref 98–111)
Creatinine, Ser: 1.25 mg/dL — ABNORMAL HIGH (ref 0.61–1.24)
GFR calc Af Amer: >60 mL/min (ref >60)
GFR calc non Af Amer: 53 mL/min — ABNORMAL LOW (ref >60)
Glucose, Bld: 152 mg/dL — ABNORMAL HIGH (ref 70–99)
Potassium: 3.3 mmol/L — ABNORMAL LOW (ref 3.5–5.1)
Sodium: 146 mmol/L — ABNORMAL HIGH (ref 135–145)

## 2019-11-29 LAB — GLUCOSE, CAPILLARY
Glucose-Capillary: 119 mg/dL — ABNORMAL HIGH (ref 70–99)
Glucose-Capillary: 122 mg/dL — ABNORMAL HIGH (ref 70–99)
Glucose-Capillary: 124 mg/dL — ABNORMAL HIGH (ref 70–99)
Glucose-Capillary: 132 mg/dL — ABNORMAL HIGH (ref 70–99)
Glucose-Capillary: 140 mg/dL — ABNORMAL HIGH (ref 70–99)
Glucose-Capillary: 164 mg/dL — ABNORMAL HIGH (ref 70–99)

## 2019-11-29 LAB — CULTURE, BLOOD (ROUTINE X 2)
Culture: NO GROWTH
Culture: NO GROWTH
Special Requests: ADEQUATE
Special Requests: ADEQUATE

## 2019-11-29 LAB — MAGNESIUM: Magnesium: 2.2 mg/dL (ref 1.7–2.4)

## 2019-11-29 MED ORDER — POTASSIUM CHLORIDE CRYS ER 20 MEQ PO TBCR
40.0000 meq | EXTENDED_RELEASE_TABLET | Freq: Once | ORAL | Status: AC
  Administered 2019-11-29: 40 meq via ORAL
  Filled 2019-11-29: qty 2

## 2019-11-29 NOTE — Progress Notes (Signed)
11/29/19 1500  PT Visit Information  Last PT Received On 11/29/19  Assistance Needed +2 (+3 beneficial for functional transfers)  History of Present Illness Pt is a 86 y.o. male with a history of diet-controlled type 2 diabetes with stage III chronic kidney disease and diabetic neuropathy, coronary artery disease status post bypass, CVA.  Pt presents with a fall and subsequent right hip intertrochanteric fracture. pt had IMHS nailing 11/16/2019. Pt developed metabolic encephalopathy and acute cystitis. imaging also noted pt to have acute L parietal lobe infarct.   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, L pusher syndrome  Restrictions  Weight Bearing Restrictions Yes  RLE Weight Bearing PWB  RLE Partial Weight Bearing Percentage or Pounds 50  Pain Assessment  Pain Assessment Faces  Faces Pain Scale 6  Pain Location R hip  Pain Descriptors / Indicators Grimacing;Guarding  Pain Intervention(s) Limited activity within patient's tolerance;Monitored during session;Repositioned  Cognition  Arousal/Alertness Awake/alert;Lethargic  Behavior During Therapy Flat affect  Overall Cognitive Status Impaired/Different from baseline  Area of Impairment Awareness;Problem solving;Following commands  Orientation Level Time;Situation  Current Attention Level Selective  Memory Decreased short-term memory  Following Commands Follows one step commands inconsistently;Follows one step commands with increased time  Safety/Judgement Decreased awareness of safety;Decreased awareness of deficits  Awareness Intellectual  Problem Solving Slow processing;Decreased initiation;Difficulty sequencing;Requires verbal cues;Requires tactile cues  Bed Mobility  Overal bed mobility Needs Assistance  Bed Mobility Rolling  Rolling Max assist;+2 for physical assistance;Mod assist  General bed mobility comments Mod to max assist to place pad  Transfers  Overall transfer level Needs  assistance  Transfer via [place] transfer comment Used Maximove lift with assist of mobility tech, [name] to get pt OOB to chair.   Ambulation/Gait  General Gait Details deferred, unable  Modified Rankin (Stroke Patients Only)  Pre-Morbid Rankin Score 0  Modified Rankin 5  Exercises  Exercises Other exercises  General Exercises - Lower Extremity  Ankle Circles/Pumps AROM;AAROM;5 reps;Supine  Quad Sets AAROM;5 reps;Both;Supine  Long Arc Quad AROM;10 reps;Seated;Right;Left  PT - End of Session  Activity Tolerance Patient limited by fatigue;Patient limited by lethargy;Patient limited by pain  Patient left with call bell/phone within reach;in chair;with chair alarm set;with family/visitor present  Nurse Communication Mobility status;Need for lift equipment   PT - Assessment/Plan  PT Plan Current plan remains appropriate  PT Visit Diagnosis Pain;Other abnormalities of gait and mobility (R26.89);Repeated falls (R29.6)  Pain - Right/Left Right  Pain - part of body Hip  PT Frequency (ACUTE ONLY) Min 3X/week  Follow Up Recommendations SNF  PT equipment None recommended by PT (TBD in next venue)  AM-PAC PT "6 Clicks" Mobility Outcome Measure (Version 2)  Help needed turning from your back to your side while in a flat bed without using bedrails? 1  Help needed moving from lying on your back to sitting on the side of a flat bed without using bedrails? 1  Help needed moving to and from a bed to a chair (including a wheelchair)? 1  Help needed standing up from a chair using your arms (e.g., wheelchair or bedside chair)? 1  Help needed to walk in hospital room? 1  Help needed climbing 3-5 steps with a railing?  1  6 Click Score 6  Consider Recommendation of Discharge To: CIR/SNF/LTACH  PT Goal Progression  Progress towards PT goals Not progressing toward goals - comment (used lift to get pt OOB as pt could not transfer earlier)  PT Time Calculation  PT Start Time  (ACUTE ONLY) 1432  PT Stop Time (ACUTE ONLY) 1457  PT Time Calculation (min) (ACUTE ONLY) 25 min  PT General Charges  $$ ACUTE PT VISIT 1 Visit  PT Treatments  $Therapeutic Exercise 8-22 mins  $Therapeutic Activity 8-22 mins  Wife really wanted pt to get OOB and pt was too fatigued earlier.  Returned in pm and with assist of [name], mobility tech, got pt OOB. He performed a few exercises as well.  [name],PT Acute Rehabilitation Services Pager:  [phone]  Office:  [phone]

## 2019-11-29 NOTE — TOC Progression Note (Signed)
Transition of Care [facility]) - Progression Note    Patient Details  Name: [name] MRN: [id number] Date of Birth: [birth year]  Transition of Care [facility]) CM/SW [place], [place] Phone Number: 11/29/2019, 10:14 AM  Clinical Narrative:    CSW followed up with [facility], insurance authorization currently still pending reference number [id number].  Expected Discharge Plan: [place] Barriers to Discharge: Continued Medical Work up  Expected Discharge Plan and Services Expected Discharge Plan: [place] arrangements for the past 2 months: Single Family Home                                       Social Determinants of Health (SDOH) Interventions    Readmission Risk Interventions No flowsheet data found.

## 2019-11-29 NOTE — Progress Notes (Signed)
Occupational Therapy Treatment Patient Details Name: [name] MRN: [id number] DOB: 1934-03-01 Today's Date: 11/29/2019    History of present illness Pt is a 86 y.o. male with a history of diet-controlled type 2 diabetes with stage III chronic kidney disease and diabetic neuropathy, coronary artery disease status post bypass, CVA.  Pt presents with a fall and subsequent right hip intertrochanteric fracture. pt had IMHS nailing 11/16/2019. Pt developed metabolic encephalopathy and acute cystitis. imaging also noted pt to have acute L parietal lobe infarct.    OT comments  Pt slowly progressing to EOB and standing tasks.  Pt limited by decreased command follow, decreased strength, decreased activity tolerance and decreased ability to care for self. Pt following 1 step commands inconsistently. Pt requiring hand over hand assist or tactile cues to complete task. Activities for sitting balance performed; pt leaning to L; pt reaching out to hold to stedy and unable to sit upright without modA overall. Stedy for sit to stands x2 times <10 secs each, poor clearance off of bed. Pt fatigues so easily and unsafe to attempt further transfer; staff aware to use hoyer lift when pt ready for OOB later today. Pt would benefit from continued OT skilled services. OT following acutely.    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, L pusher syndrome Restrictions Weight Bearing Restrictions: Yes RLE Weight Bearing: Partial weight bearing RLE Partial Weight Bearing Percentage or Pounds: 50       Mobility Bed Mobility Overal bed mobility: Needs Assistance Bed Mobility: Rolling Rolling: Max assist;+2 for physical assistance         General bed mobility comments: maxA+2 overall due  to poor command follow  Transfers Overall transfer level: Needs assistance Equipment used: Ambulation equipment used Transfers: Sit to/from Stand Sit to Stand: Total assist;+2 physical assistance;+2 safety/equipment         General transfer comment: poor posture for use of stedy; unable to sucessfully stand    Balance Overall balance assessment: Needs assistance Sitting-balance support: Feet supported;Bilateral upper extremity supported Sitting balance-Leahy Scale: Poor Sitting balance - Comments: Activities for sitting balance performed; pt leaning to L; pt reaching out to hold to stedy and unable to sit upright without modA overall. Postural control: Left lateral lean Standing balance support: Bilateral upper extremity supported Standing balance-Leahy Scale: Zero Standing balance comment: Pt attempting stand with stedy, but only able to clear bed by 4 inches and lasts <10 secs upright. Attemp x2 times.                           ADL either performed or assessed with clinical judgement   ADL Overall ADL's : Needs assistance/impaired                     Lower Body Dressing: Total assistance;Sitting/lateral leans;Bed level   Toilet Transfer: Total assistance;+2 for physical assistance;+2 for safety/equipment Toilet Transfer Details (indicate cue type and reason): staff to use lift         Functional mobility during ADLs: Total assistance;+2 for physical assistance;+2 for safety/equipment General ADL Comments: Pt limited by decreased command follow, decreased strength, decreased activity tolerance and decreased ability to care for self.     Vision       Perception  Praxis      Cognition Arousal/Alertness: Awake/alert;Lethargic Behavior During Therapy: Flat affect Overall Cognitive Status: Impaired/Different from baseline Area of Impairment: Awareness;Problem solving;Following commands                 Orientation Level:  Time;Situation Current Attention Level: Selective Memory: Decreased short-term memory Following Commands: Follows one step commands inconsistently;Follows one step commands with increased time Safety/Judgement: Decreased awareness of safety;Decreased awareness of deficits Awareness: Intellectual Problem Solving: Slow processing;Decreased initiation;Difficulty sequencing;Requires verbal cues;Requires tactile cues General Comments: Pt following 1 step commands inconsistently. Pt requiring hand over hand assist or tactile cues to complete task.        Exercises Exercises: Other exercises General Exercises - Lower Extremity Ankle Circles/Pumps: AROM;AAROM;5 reps;Supine Quad Sets: AAROM;5 reps;Both;Supine Long Arc Quad: AROM;10 reps;Seated;Right;Left Other Exercises Other Exercises: Shoulder through digits, AROM, WFLs   Shoulder Instructions       General Comments VSS. Spouse in room.    Pertinent Vitals/ Pain       Pain Assessment: Faces Faces Pain Scale: Hurts even more Pain Location: R hip Pain Descriptors / Indicators: Grimacing;Guarding Pain Intervention(s): Monitored during session;Repositioned  Home Living                                          Prior Functioning/Environment              Frequency  Min 2X/week        Progress Toward Goals  OT Goals(current goals can now be found in the care plan section)  Progress towards OT goals: Progressing toward goals  Acute Rehab OT Goals Patient Stated Goal: to go to rehab OT Goal Formulation: With family Time For Goal Achievement: 12/13/19 Potential to Achieve Goals: Good ADL Goals Pt Will Perform Grooming: with min assist;sitting Pt Will Perform Upper Body Dressing: with min assist;sitting Pt Will Perform Lower Body Dressing: with max assist;sitting/lateral leans Pt Will Transfer to Toilet: with max assist;with +2 assist;squat pivot transfer;bedside commode Additional ADL Goal #1: Patient  will require min A with min verbal cues for sequencing for bed mobility in preparation for self care tasks. Additional ADL Goal #2: Pt will follow 1 step commands with multimodal cues utilized for full participation 50% of the time requiring no cues to attend to task.  Plan Discharge plan remains appropriate    Co-evaluation    PT/OT/SLP Co-Evaluation/Treatment: Yes Reason for Co-Treatment: Complexity of the patient's impairments (multi-system involvement);To address functional/ADL transfers   OT goals addressed during session: ADL's and self-care      AM-PAC OT "6 Clicks" Daily Activity     Outcome Measure   Help from another person eating meals?: A Little Help from another person taking care of personal grooming?: A Lot Help from another person toileting, which includes using toliet, bedpan, or urinal?: Total Help from another person bathing (including washing, rinsing, drying)?: A Lot Help from another person to put on and taking off regular upper body clothing?: A Lot Help from another person to put on and taking off regular lower body clothing?: Total 6 Click Score: 11    End of Session Equipment Utilized During Treatment: Gait belt;Rolling walker  OT Visit Diagnosis: Other abnormalities of gait and mobility (R26.89);History of falling (Z91.81);Muscle weakness (generalized) (M62.81);Pain Pain - Right/Left: Right Pain - part of body: Hip   Activity Tolerance Patient tolerated treatment well   Patient Left in  bed;with call bell/phone within reach;with bed alarm set;with family/visitor present   Nurse Communication Mobility status;Other (comment) (PT talking with mobility tech for OOB with maximove later)        Time: 1751-0258 OT Time Calculation (min): 41 min  Charges: OT General Charges $OT Visit: 1 Visit OT Treatments $Neuromuscular Re-education: 8-22 mins  Jefferey Pica, OTR/L Acute Rehabilitation Services Pager: [phone] Office: [phone]    Vika Buske  C 11/29/2019, 4:11 PM

## 2019-11-29 NOTE — Progress Notes (Signed)
Patient returned to unit, no complaints at this time. Family member currently at bedside.

## 2019-11-29 NOTE — Progress Notes (Signed)
Modified Barium Swallow Progress Note  Patient Details  Name: [name] MRN: [id number] Date of Birth: 04/11/1934  Today's Date: 11/29/2019  Modified Barium Swallow completed.  Full report located under Chart Review in the Imaging Section.  Brief recommendations include the following:  Clinical Impression  Pt presents with oropharyngeal dysphagia characterized by impaired bolus cohesion, an oral transit delay, reduced anterior laryngeal moevement, and a pharyngeal delay. He demonstrated lingual rocking, premature spilage to the valleculae and pyriform sinuses with the swallow often triggered with the head of the bolus at the pyriform sinuses, and silent aspiration (PAS 8) with thin liquids via cup and with nectar thick liquids via straw secondary to premature spillage and the pharyngeal delay. Transient penetration (PAS 2) was noted with nectar thick liquids via cup. Depth of laryngeal invasion with liquids was improved with a chin tuck posture but pt was unable to consistently demonstrate this even with verbal and tactile cueing. A dysphagia 3 diet with nectar thick liquids is recommended with strict observance of swallowing precautions including, but not limited to, avoidance of straws. SLP will continue to follow pt for dysphagia treatment.    Swallow Evaluation Recommendations       SLP Diet Recommendations: Dysphagia 3 (Mech soft) solids;Nectar thick liquid   Liquid Administration via: Cup;No straw   Medication Administration: Whole meds with puree   Supervision: Staff to assist with self feeding;Full supervision/cueing for compensatory strategies   Compensations: Slow rate;Small sips/bites   Postural Changes: Seated upright at 90 degrees   Oral Care Recommendations: Oral care BID   Other Recommendations: Remove water pitcher;Prohibited food (jello, ice cream, thin soups);Order thickener from [name], [place], [place] Office number [phone] Pager [place] 11/29/2019,1:24 PM

## 2019-11-29 NOTE — Progress Notes (Signed)
[facility] PROGRESS NOTE    Progress Note  AVIER JECH  GEX:[id number] DOB: 09/21/1933 DOA: 11/15/2019 PCP: [name], MD     Brief Narrative:   [name] is an 86 y.o. male past medical history of essential hypertension, CAD status post CABG in 2017, CVA TIA, AAA, history of GI bleed carotid stenosis, chronic kidney disease stage IIIa admitted to the hospital secondary to mechanical fall leading to right hip fracture that requires surgical repair on 11/16/2019 started on postop DVT prophylaxis, hospital course became complicated by postop anemia requiring 1 unit of packed red blood cells.  He then developed metabolic encephalopathy with MRI showing an acute CVA, for which neurology was consulted.  EEG was overall unremarkable.  He then developed elevated troponins and cardiology.  Due to his patient encephalopathy his work-up was besides a stroke negative, his Lyrica was taper off, he ended up growing MSSA bacteremia and started on Ancef, his repeated blood cultures remain negative.  Assessment/Plan:   Acute metabolic encephalopathy slow to improve/acute right parietal lobe lobe infarct: CT of the head negative, MRI was positive for CVA. EEG was unremarkable, transcranial Doppler was negative. 2D echo showed preserved EF, he had an A1c of 6.1, LDL 59 TSH within normal limits. LP on 11/22/2019 showed no evidence of infection. He is off Lyrica. His encephalopathy is completely resolved likely due to underlying infectious etiology. He remains high risk for aspiration and delirium try to keep the head elevated above 45 degrees. Avoid narcotics and benzodiazepines at this could send them into acute confusional state. Awaiting insurance approval for skilled nursing facility placement.  Staph epidermidis bacteremia: Suspect likely due to hip repair. He was initially started on IV Ancef but he continues to spike fever and leukocytosis is rising it was discussed with  ID. He was transitioned to IV Zyvox he has defervesced and leukocytosis improving. 2D echo showed no signs of vegetation. Surveillance cultures have remained negative till date. Will need to continue Zyvox for a  total 2 weeks  Abdominal distention: Initially had nausea and vomiting which is now resolved. Abdominal x-ray is negative for ileus. Physical therapy and Occupational Therapy have evaluated the patient recommended skilled nursing facility.  Acute blood loss anemia postop: Status post 1 unit of packed red blood cells, Xarelto was held for DVT prophylaxis due to recent drop in hemoglobin. Orthopedic surgery to dictate when to restart anticoagulation for DVT prophylaxis.  Closed right hip fracture status post surgical repair on 11/19/2020: Status post right hip intramedullary nailing. Physical therapy evaluated the patient recommended skilled nursing facility.  Elevated troponin: Likely demand ischemia, cardiology following, 2D echo showed an EF of 60% with grade 2 diastolic heart failure. Now resolved he denies any chest pain or shortness of breath.  Sinus Tachycardia: Improved with IV fluid hydration likely due to hypovolemia.  Autonomic dysfunction: Continue Florinef.  Dysphagia: Continue aspiration precaution, speech is swallowing.  Chronic kidney disease stage IIIa: Continues to be at baseline KVO IV fluid he is tolerating his oral diet.  Coronary artery disease status post CABG: Currently chest pain-free.  Ambulatory dysfunction: Status post hip repair, physical therapy recommended skilled nursing facility.   DVT prophylaxis: lovenox Family Communication: Wife at bedside Status is: Inpatient  Remains inpatient appropriate because:Hemodynamically stable, awaiting insurance approval.   Dispo:  Patient From: Home  Planned Disposition: [place]  Expected discharge date: 11/29/19  Medically stable for discharge: No  Code Status:     Code  Status Orders  (  From admission, onward)         Start     Ordered   11/15/19 2235  Do not attempt resuscitation (DNR)  Continuous       Question Answer Comment  In the event of cardiac or respiratory ARREST Do not call a "code blue"   In the event of cardiac or respiratory ARREST Do not perform Intubation, CPR, defibrillation or ACLS   In the event of cardiac or respiratory ARREST Use medication by any route, position, wound care, and other measures to relive pain and suffering. May use oxygen, suction and manual treatment of airway obstruction as needed for comfort.      11/15/19 2235        Code Status History    Date Active Date Inactive Code Status Order ID Comments User Context   06/24/2018 1449 06/27/2018 2006 Full Code [id number]  Truett Mainland, DO Inpatient   05/17/2017 1213 05/21/2017 1702 Full Code [id number]  Roxan Hockey, MD ED   02/17/2016 2059 02/20/2016 1545 Full Code [id number]  Karmen Bongo, MD Inpatient   07/03/2015 1530 07/06/2015 1402 Full Code [id number]  Consuelo Pandy, PA-C Inpatient   Advance Care Planning Activity    Advance Directive Documentation     Most Recent Value  Type of Advance Directive Living will  Pre-existing out of facility DNR order (yellow form or pink MOST form) --  "MOST" Form in Place? --        IV Access:    Peripheral IV   Procedures and diagnostic studies:   No results found.   Medical Consultants:    None.  Anti-Infectives:   Zyvox IV  Subjective:    Praveen Coia Cadle no complaints this morning.  Objective:    Vitals:   11/28/19 1428 11/28/19 2006 11/29/19 0046 11/29/19 0404  BP: (!) 142/73 (!) 157/90  (!) 148/79  Pulse: 91 97  100  Resp: 20 18  17   Temp: 99.5 F (37.5 C) 99.1 F (37.3 C)  98.9 F (37.2 C)  TempSrc: Oral Oral  Oral  SpO2: 95% 96%  93%  Weight:   (!) 95.8 kg   Height:       SpO2: 93 % O2 Flow Rate (L/min): 2 L/min   Intake/Output Summary (Last 24 hours) at 11/29/2019  0955 Last data filed at 11/29/2019 0815 Gross per 24 hour  Intake 1946.04 ml  Output 950 ml  Net 996.04 ml   Filed Weights   11/27/19 0019 11/28/19 0100 11/29/19 0046  Weight: 91.8 kg (!) 97.3 kg (!) 95.8 kg    Exam: General exam: In no acute distress. Respiratory system: Good air movement and clear to auscultation. Cardiovascular system: S1 & S2 heard, RRR. No JVD. Gastrointestinal system: Abdomen is nondistended, soft and nontender.  Extremities: No pedal edema. Skin: No rashes, lesions or ulcers Psychiatry: Judgement and insight appear normal. Mood & affect appropriate.   Data Reviewed:    Labs: Basic Metabolic Panel: Recent Labs  Lab 11/25/19 0454 11/25/19 0454 11/26/19 0423 11/26/19 0423 11/27/19 0552 11/27/19 0552 11/28/19 0425 11/29/19 0040  NA 145  --  144  --  144  --  145 146*  K 3.2*   < > 3.2*   < > 3.0*   < > 3.6 3.3*  CL 110  --  110  --  109  --  111 110  CO2 26  --  26  --  27  --  26 27  GLUCOSE 147*  --  151*  --  146*  --  131* 152*  BUN 23  --  22  --  24*  --  29* 30*  CREATININE 1.17  --  1.08  --  1.23  --  1.27* 1.25*  CALCIUM 8.1*  --  8.0*  --  8.1*  --  8.3* 8.2*  MG 2.1  --  2.0  --  2.2  --  2.4 2.2   < > = values in this interval not displayed.   GFR Estimated Creatinine Clearance: 52.8 mL/min (A) (by C-G formula based on SCr of 1.25 mg/dL (H)). Liver Function Tests: No results for input(s): AST, ALT, ALKPHOS, BILITOT, PROT, ALBUMIN in the last 168 hours. No results for input(s): LIPASE, AMYLASE in the last 168 hours. No results for input(s): AMMONIA in the last 168 hours. Coagulation profile No results for input(s): INR, PROTIME in the last 168 hours. COVID-19 Labs  No results for input(s): DDIMER, FERRITIN, LDH, CRP in the last 72 hours.  Lab Results  Component Value Date   SARSCOV2NAA NEGATIVE 11/27/2019   [place] NEGATIVE 11/15/2019   SARSCOV2NAA NOT DETECTED 04/20/2019   [place] Not Detected 09/30/2018     CBC: Recent Labs  Lab 11/25/19 0454 11/26/19 0423 11/27/19 0552 11/28/19 0425 11/29/19 0040  WBC 15.1* 17.9* 18.4* 16.5* 14.5*  HGB 9.2* 9.4* 8.9* 9.1* 9.0*  HCT 28.8* 29.0* 28.1* 28.9* 29.8*  MCV 93.5 94.8 94.9 96.0 97.4  PLT 342 389 387 430* 473*   Cardiac Enzymes: No results for input(s): CKTOTAL, CKMB, CKMBINDEX, TROPONINI in the last 168 hours. BNP (last 3 results) No results for input(s): PROBNP in the last 8760 hours. CBG: Recent Labs  Lab 11/28/19 1649 11/28/19 2007 11/29/19 0003 11/29/19 0405 11/29/19 0729  GLUCAP 145* 156* 140* 119* 124*   D-Dimer: No results for input(s): DDIMER in the last 72 hours. Hgb A1c: No results for input(s): HGBA1C in the last 72 hours. Lipid Profile: No results for input(s): CHOL, HDL, LDLCALC, TRIG, CHOLHDL, LDLDIRECT in the last 72 hours. Thyroid function studies: No results for input(s): TSH, T4TOTAL, T3FREE, THYROIDAB in the last 72 hours.  Invalid input(s): FREET3 Anemia work up: No results for input(s): VITAMINB12, FOLATE, FERRITIN, TIBC, IRON, RETICCTPCT in the last 72 hours. Sepsis Labs: Recent Labs  Lab 11/26/19 0423 11/27/19 0552 11/28/19 0425 11/29/19 0040  WBC 17.9* 18.4* 16.5* 14.5*   Microbiology Recent Results (from the past 240 hour(s))  Culture, blood (Routine X 2) w Reflex to ID Panel     Status: Abnormal   Collection Time: 11/21/19  9:09 AM   Specimen: BLOOD RIGHT HAND  Result Value Ref Range Status   Specimen Description BLOOD RIGHT HAND  Final   Special Requests   Final    BOTTLES DRAWN AEROBIC ONLY Blood Culture adequate volume   Culture  Setup Time   Final    GRAM POSITIVE COCCI IN CLUSTERS AEROBIC BOTTLE ONLY CRITICAL RESULT CALLED TO, READ BACK BY AND VERIFIED WITH: Sarajane Marek [phone]  Performed at [facility], [address] [place], [place] [address]    Culture STAPHYLOCOCCUS EPIDERMIDIS (A)  Final   Report Status 11/24/2019 FINAL  Final   Organism ID, Bacteria  STAPHYLOCOCCUS EPIDERMIDIS  Final      Susceptibility   Staphylococcus epidermidis - MIC*    CIPROFLOXACIN >=8 RESISTANT Resistant     ERYTHROMYCIN <=0.25 SENSITIVE Sensitive     GENTAMICIN <=0.5 SENSITIVE Sensitive     OXACILLIN <=0.25  SENSITIVE Sensitive     TETRACYCLINE <=1 SENSITIVE Sensitive     VANCOMYCIN 1 SENSITIVE Sensitive     TRIMETH/SULFA 160 RESISTANT Resistant     CLINDAMYCIN <=0.25 SENSITIVE Sensitive     RIFAMPIN <=0.5 SENSITIVE Sensitive     Inducible Clindamycin NEGATIVE Sensitive     * STAPHYLOCOCCUS EPIDERMIDIS  Blood Culture ID Panel (Reflexed)     Status: Abnormal   Collection Time: 11/21/19  9:09 AM  Result Value Ref Range Status   Enterococcus species NOT DETECTED NOT DETECTED Final   Listeria monocytogenes NOT DETECTED NOT DETECTED Final   Staphylococcus species DETECTED (A) NOT DETECTED Final    Comment: Methicillin (oxacillin) susceptible coagulase negative staphylococcus. Possible blood culture contaminant (unless isolated from more than one blood culture draw or clinical case suggests pathogenicity). No antibiotic treatment is indicated for blood  culture contaminants. CRITICAL RESULT CALLED TO, READ BACK BY AND VERIFIED WITH: Sarajane Marek [id number] AT [id number] SK     Staphylococcus aureus (BCID) NOT DETECTED NOT DETECTED Final   Methicillin resistance NOT DETECTED NOT DETECTED Final   Streptococcus species NOT DETECTED NOT DETECTED Final   Streptococcus agalactiae NOT DETECTED NOT DETECTED Final   Streptococcus pneumoniae NOT DETECTED NOT DETECTED Final   Streptococcus pyogenes NOT DETECTED NOT DETECTED Final   Acinetobacter baumannii NOT DETECTED NOT DETECTED Final   Enterobacteriaceae species NOT DETECTED NOT DETECTED Final   Enterobacter cloacae complex NOT DETECTED NOT DETECTED Final   Escherichia coli NOT DETECTED NOT DETECTED Final   Klebsiella oxytoca NOT DETECTED NOT DETECTED Final   Klebsiella pneumoniae NOT DETECTED NOT DETECTED Final   Proteus  species NOT DETECTED NOT DETECTED Final   Serratia marcescens NOT DETECTED NOT DETECTED Final   Haemophilus influenzae NOT DETECTED NOT DETECTED Final   Neisseria meningitidis NOT DETECTED NOT DETECTED Final   Pseudomonas aeruginosa NOT DETECTED NOT DETECTED Final   Candida albicans NOT DETECTED NOT DETECTED Final   Candida glabrata NOT DETECTED NOT DETECTED Final   Candida krusei NOT DETECTED NOT DETECTED Final   Candida parapsilosis NOT DETECTED NOT DETECTED Final   Candida tropicalis NOT DETECTED NOT DETECTED Final    Comment: Performed at [facility], [address] [place], [place] [address]  Culture, blood (Routine X 2) w Reflex to ID Panel     Status: Abnormal   Collection Time: 11/21/19  9:13 AM   Specimen: BLOOD LEFT HAND  Result Value Ref Range Status   Specimen Description BLOOD LEFT HAND  Final   Special Requests   Final    BOTTLES DRAWN AEROBIC ONLY Blood Culture results may not be optimal due to an inadequate volume of blood received in culture bottles   Culture  Setup Time   Final    GRAM POSITIVE COCCI IN CLUSTERS AEROBIC BOTTLE ONLY CRITICAL VALUE NOTED.  VALUE IS CONSISTENT WITH PREVIOUSLY REPORTED AND CALLED VALUE.    Culture (A)  Final    STAPHYLOCOCCUS EPIDERMIDIS SUSCEPTIBILITIES PERFORMED ON PREVIOUS CULTURE WITHIN THE LAST 5 DAYS. Performed at [facility], [place] [address] [place], [place] [address]    Report Status 11/24/2019 FINAL  Final  Anaerobic culture     Status: None   Collection Time: 11/22/19  5:15 PM   Specimen: PATH Cytology CSF; Cerebrospinal Fluid  Result Value Ref Range Status   Specimen Description CSF  Final   Special Requests NONE  Final   Culture   Final    NO ANAEROBES ISOLATED Performed at [facility], [place] [address], [place], [place] [address]    Report Status 11/27/2019 FINAL  Final  CSF culture     Status: None   Collection Time: 11/22/19  5:15 PM   Specimen: PATH Cytology CSF; Cerebrospinal Fluid  Result  Value Ref Range Status   Specimen Description CSF  Final   Special Requests NONE  Final   Gram Stain   Final    WBC PRESENT, PREDOMINANTLY MONONUCLEAR NO ORGANISMS SEEN CYTOSPIN SMEAR    Culture   Final    NO GROWTH 3 DAYS Performed at [facility], [place] [address], [place], [place] [address]    Report Status 11/26/2019 FINAL  Final  Culture, fungus without smear     Status: None (Preliminary result)   Collection Time: 11/22/19  5:15 PM   Specimen: PATH Cytology CSF; Cerebrospinal Fluid  Result Value Ref Range Status   Specimen Description CSF  Final   Special Requests NONE  Final   Culture   Final    NO FUNGUS ISOLATED AFTER 6 DAYS Performed at [facility], [place] [address], [place], [place] [address]    Report Status PENDING  Incomplete  Culture, blood (Routine X 2) w Reflex to ID Panel     Status: None (Preliminary result)   Collection Time: 11/24/19  5:45 AM   Specimen: BLOOD  Result Value Ref Range Status   Specimen Description BLOOD RIGHT ANTECUBITAL  Final   Special Requests   Final    BOTTLES DRAWN AEROBIC AND ANAEROBIC Blood Culture adequate volume   Culture   Final    NO GROWTH 4 DAYS Performed at [facility], [place] [address] [place], [place] [address]    Report Status PENDING  Incomplete  Culture, blood (Routine X 2) w Reflex to ID Panel     Status: None (Preliminary result)   Collection Time: 11/24/19  5:46 AM   Specimen: BLOOD LEFT HAND  Result Value Ref Range Status   Specimen Description BLOOD LEFT HAND  Final   Special Requests   Final    BOTTLES DRAWN AEROBIC AND ANAEROBIC Blood Culture adequate volume   Culture   Final    NO GROWTH 4 DAYS Performed at [facility], [place] [address] [place], [place] [address]    Report Status PENDING  Incomplete  SARS Coronavirus 2 by RT PCR (hospital order, performed in [facility] hospital lab) Nasopharyngeal Nasopharyngeal Swab     Status: None   Collection Time: 11/27/19  5:20 PM   Specimen:  Nasopharyngeal Swab  Result Value Ref Range Status   SARS Coronavirus 2 NEGATIVE NEGATIVE Final    Comment: (NOTE) SARS-CoV-2 target nucleic acids are NOT DETECTED.  The SARS-CoV-2 RNA is generally detectable in upper and lower respiratory specimens during the acute phase of infection. The lowest concentration of SARS-CoV-2 viral copies this assay can detect is 250 copies / mL. A negative result does not preclude SARS-CoV-2 infection and should not be used as the sole basis for treatment or other patient management decisions.  A negative result may occur with improper specimen collection / handling, submission of specimen other than nasopharyngeal swab, presence of viral mutation(s) within the areas targeted by this assay, and inadequate number of viral copies (<250 copies / mL). A negative result must be combined with clinical observations, patient history, and epidemiological information.  Fact Sheet for Patients:   StrictlyIdeas.no  Fact Sheet for Healthcare Providers: BankingDealers.co.za  This test  is not yet approved or  cleared by the [place] and has been authorized for detection and/or diagnosis of SARS-CoV-2 by FDA under an Emergency Use Authorization (EUA).  This EUA will remain in effect (meaning this test can be used) for the duration of the COVID-19 declaration under Section 564(b)(1) of the Act, 21 U.S.C. section 360bbb-3(b)(1), unless the authorization is terminated or revoked sooner.  Performed at [facility], [place] [address] [place], [place] [address]      Medications:   . aspirin EC  81 mg Oral Daily  . atorvastatin  80 mg Oral Daily  . docusate sodium  100 mg Oral BID  . feeding supplement (ENSURE ENLIVE)  237 mL Oral BID BM  . fludrocortisone  0.1 mg Oral Daily  . insulin aspart  0-9 Units Subcutaneous Q4H  . multivitamin with minerals  1 tablet Oral Daily  . pantoprazole  40 mg Oral  Daily  . polyethylene glycol  17 g Oral Daily  . rivaroxaban  10 mg Oral Daily   Continuous Infusions: . sodium chloride 50 mL/hr at 11/29/19 0422  . linezolid (ZYVOX) IV 600 mg (11/28/19 2141)      LOS: 14 days   Charlynne Cousins  [facility]  11/29/2019, 9:55 AM

## 2019-11-29 NOTE — Progress Notes (Signed)
Patient off of unit to Modified Barium swallow test.

## 2019-11-29 NOTE — Progress Notes (Signed)
Physical Therapy Treatment Patient Details Name: [name] MRN: [id number] DOB: October 30, 1933 Today's Date: 11/29/2019    History of Present Illness Pt is a 86 y.o. male with a history of diet-controlled type 2 diabetes with stage III chronic kidney disease and diabetic neuropathy, coronary artery disease status post bypass, CVA.  Pt presents with a fall and subsequent right hip intertrochanteric fracture. pt had [facility] nailing 11/16/2019. Pt developed metabolic encephalopathy and acute cystitis. imaging also noted pt to have acute L parietal lobe infarct.     PT Comments    Pt admitted with above diagnosis. Pt able to sit EOB for up to 25 min but needs incr assist as he fatigues needing mod to total assist of 2.  Did attempt 2 stands today to the [facility] with max to total assist of 2.  Progress very slow. Pt met 0/4 goals due to cognitive issues, poor endurance and weakness.  Goals revised.   Pt currently with functional limitations due to the deficits listed below (see PT Problem List). Pt will benefit from skilled PT to increase their independence and safety with mobility to allow discharge to the venue listed below.     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, L pusher syndrome Restrictions Weight Bearing Restrictions: Yes RLE Weight Bearing: Partial weight bearing RLE Partial Weight Bearing Percentage or Pounds: 50    Mobility  Bed Mobility Overal bed mobility: Needs Assistance Bed Mobility: Supine to Sit Rolling: Max assist;+2 for physical assistance   Supine to sit: +2 for physical assistance;+2 for safety/equipment;HOB elevated;Total assist Sit to supine: +2 for physical assistance;+2 for safety/equipment;Total assist   General bed mobility comments: pt unable to  maintain sidelying due to increase in pain in R hip with roll to sidelying and required mutlimodal cueing and max assist +2. pt was total assit +2 with supine to sit. Pt needed total ssist of 2 to lie down as well due to extreme fatigue.   Transfers Overall transfer level: Needs assistance Equipment used: Ambulation equipment used Transfers: Sit to/from Stand Sit to Stand: Max assist;+2 physical assistance;Total assist         General transfer comment: Pt lifted buttocks off bed about 4-5 inches with mod to max assist of 2 therapists to [facility] x 2 and stood for about 10 seconds the first time and 4 seconds the second time.  Pt posture after the 2 stands was so poor that it was deemed unsafe to transfer pt to chair scooting therefore laid pt back down in bed for a rest break.  Pt was monitored for weight bearing on right LE.  Pt had a hard time processing to place weight on the left LE.  Pt with very delayed reaction time to commands vs. no reaction time.  He does not correct posture spontaneously nor to command.   Ambulation/Gait             General Gait Details: deferred, unable   Stairs             Wheelchair Mobility    Modified Rankin (Stroke Patients Only) Modified Rankin (Stroke Patients Only) Pre-Morbid Rankin Score: No symptoms Modified Rankin: Severe disability     Balance Overall balance assessment: Needs assistance Sitting-balance support: Feet supported;Bilateral upper extremity supported Sitting balance-Leahy  Scale: Poor Sitting balance - Comments: +2 assist to maintain sitting balance at EOB. Pt with poor tolerance of activity and has very poor energy reserve.  Postural control: Left lateral lean Standing balance support: Bilateral upper extremity supported Standing balance-Leahy Scale: Poor Standing balance comment: Stood with STedy for up to 10 seconds with about 4 inches clearance of buttocks off bed.  Monitored weight on right LE to 50%.                               Cognition Arousal/Alertness: Awake/alert;Lethargic Behavior During Therapy: Flat affect Overall Cognitive Status: Impaired/Different from baseline Area of Impairment: Awareness;Problem solving;Following commands                 Orientation Level: Time;Situation Current Attention Level: Selective Memory: Decreased short-term memory Following Commands: Follows one step commands inconsistently;Follows one step commands with increased time Safety/Judgement: Decreased awareness of safety;Decreased awareness of deficits Awareness: Intellectual Problem Solving: Slow processing;Decreased initiation;Difficulty sequencing;Requires verbal cues;Requires tactile cues General Comments: Patient less alert and only able to follow 1-step vc's 50 % of time       Exercises General Exercises - Lower Extremity Ankle Circles/Pumps: AROM;AAROM;5 reps;Supine Hip Flexion/Marching: AAROM;Right;5 reps;Supine Other Exercises Other Exercises: finding midline stability x 2-3 with max to total  assist    General Comments        Pertinent Vitals/Pain Pain Assessment: Faces Faces Pain Scale: No hurt Pain Location: R hip Pain Descriptors / Indicators: Guarding Pain Intervention(s): Limited activity within patient's tolerance;Monitored during session;Repositioned    Home Living                      Prior Function            PT Goals (current goals can now be found in the care plan section) Acute Rehab PT Goals PT Goal Formulation: With patient/family Time For Goal Achievement: 12/13/19 Potential to Achieve Goals: Fair Progress towards PT goals: Not progressing toward goals - comment (Pt lethargic, decr participation today, decr command follow)    Frequency    Min 3X/week      PT Plan Current plan remains appropriate    Co-evaluation              AM-PAC PT "6 Clicks" Mobility   Outcome Measure  Help needed turning from your back to your  side while in a flat bed without using bedrails?: Total Help needed moving from lying on your back to sitting on the side of a flat bed without using bedrails?: Total Help needed moving to and from a bed to a chair (including a wheelchair)?: Total Help needed standing up from a chair using your arms (e.g., wheelchair or bedside chair)?: Total Help needed to walk in hospital room?: Total Help needed climbing 3-5 steps with a railing? : Total 6 Click Score: 6    End of Session Equipment Utilized During Treatment: Gait belt Activity Tolerance: Patient limited by fatigue;Patient limited by lethargy;Patient limited by pain Patient left: with call bell/phone within reach;with family/visitor present;in bed Nurse Communication: Mobility status;Need for lift equipment PT Visit Diagnosis: Pain;Other abnormalities of gait and mobility (R26.89);Repeated falls (R29.6) Pain - Right/Left: Right Pain - part of body: Hip     Time: 3299-2426 PT Time Calculation (min) (ACUTE ONLY): 37 min  Charges:  $Therapeutic Exercise: 8-22 mins $Therapeutic Activity: 8-22 mins  Sanjiv Castorena W,PT Acute Rehabilitation Services Pager:  [phone]  Office:  [place] 11/29/2019, 12:33 PM

## 2019-11-30 DIAGNOSIS — S066X0A Traumatic subarachnoid hemorrhage without loss of consciousness, initial encounter: Secondary | ICD-10-CM

## 2019-11-30 LAB — BASIC METABOLIC PANEL
Anion gap: 8 (ref 5–15)
BUN: 26 mg/dL — ABNORMAL HIGH (ref 8–23)
CO2: 26 mmol/L (ref 22–32)
Calcium: 7.9 mg/dL — ABNORMAL LOW (ref 8.9–10.3)
Chloride: 108 mmol/L (ref 98–111)
Creatinine, Ser: 1.16 mg/dL (ref 0.61–1.24)
GFR calc Af Amer: >60 mL/min (ref >60)
GFR calc non Af Amer: 58 mL/min — ABNORMAL LOW (ref >60)
Glucose, Bld: 148 mg/dL — ABNORMAL HIGH (ref 70–99)
Potassium: 3.5 mmol/L (ref 3.5–5.1)
Sodium: 142 mmol/L (ref 135–145)

## 2019-11-30 LAB — CBC
HCT: 28.4 % — ABNORMAL LOW (ref 39.0–52.0)
Hemoglobin: 8.7 g/dL — ABNORMAL LOW (ref 13.0–17.0)
MCH: 29.6 pg (ref 26.0–34.0)
MCHC: 30.6 g/dL (ref 30.0–36.0)
MCV: 96.6 fL (ref 80.0–100.0)
Platelets: 429 10*3/uL — ABNORMAL HIGH (ref 150–400)
RBC: 2.94 MIL/uL — ABNORMAL LOW (ref 4.22–5.81)
RDW: 16 % — ABNORMAL HIGH (ref 11.5–15.5)
WBC: 11.6 10*3/uL — ABNORMAL HIGH (ref 4.0–10.5)
nRBC: 0 % (ref 0.0–0.2)

## 2019-11-30 LAB — GLUCOSE, CAPILLARY
Glucose-Capillary: 123 mg/dL — ABNORMAL HIGH (ref 70–99)
Glucose-Capillary: 132 mg/dL — ABNORMAL HIGH (ref 70–99)
Glucose-Capillary: 132 mg/dL — ABNORMAL HIGH (ref 70–99)
Glucose-Capillary: 138 mg/dL — ABNORMAL HIGH (ref 70–99)

## 2019-11-30 LAB — MAGNESIUM: Magnesium: 2 mg/dL (ref 1.7–2.4)

## 2019-11-30 MED ORDER — ATORVASTATIN CALCIUM 80 MG PO TABS: 80.0000 mg | ORAL_TABLET | Freq: Every day | ORAL | Status: DC

## 2019-11-30 MED ORDER — LINEZOLID 600 MG PO TABS: 600.0000 mg | ORAL_TABLET | Freq: Two times a day (BID) | ORAL | 0 refills | Status: AC

## 2019-11-30 MED ORDER — RIVAROXABAN 10 MG PO TABS: 10.0000 mg | ORAL_TABLET | Freq: Every day | ORAL | Status: DC

## 2019-11-30 MED ORDER — LINEZOLID 600 MG PO TABS
600.0000 mg | ORAL_TABLET | Freq: Two times a day (BID) | ORAL | Status: DC
  Administered 2019-11-30: 600 mg via ORAL
  Filled 2019-11-30 (×2): qty 1

## 2019-11-30 NOTE — TOC Transition Note (Signed)
Transition of Care [facility]) - CM/SW Discharge Note   Patient Details  Name: [name] MRN: [id number] Date of Birth: 1933/11/28  Transition of Care [facility]) CM/SW Contact:  Bary Castilla, LCSW Phone Number: [phone] 11/30/2019, 12:32 PM   Clinical Narrative:     Patient will DC to:?[facility] Anticipated DC date:?11/30/19 Family notified:?[facility]:GGEZ   Per MD patient ready for DC to [facility]. RN, patient, patient's family, and facility notified of DC. Discharge Summary sent to facility. RN given number for report  [phone] room 34. DC packet on chart. Ambulance transport requested for patient.   CSW signing off.   [place], [place] [phone]   Final next level of care: Skilled Nursing Facility Barriers to Discharge: No Barriers Identified   Patient Goals and CMS Choice   CMS Medicare.gov Compare Post Acute Care list provided to:: Patient Represenative (must comment) (Spouse Estill Bamberg) Choice offered to / list presented to : Spouse  Discharge Placement              Patient chooses bed at: [facility] Patient to be transferred to facility by: [place] Name of family member notified: Spouse Patient and family notified of of transfer: 11/30/19  Discharge Plan and Services                                     Social Determinants of Health (SDOH) Interventions     Readmission Risk Interventions No flowsheet data found.

## 2019-11-30 NOTE — Progress Notes (Signed)
[facility] PROGRESS NOTE    Progress Note  [name]  HUD:[id number] DOB: [birth year] DOA: 11/15/2019 PCP: Dennis Pinto, MD     Brief Narrative:   [name] is an 86 y.o. male past medical history of essential hypertension, CAD status post CABG in 2017, CVA TIA, AAA, history of GI bleed carotid stenosis, chronic kidney disease stage IIIa admitted to the hospital secondary to mechanical fall leading to right hip fracture that requires surgical repair on 11/16/2019 started on postop DVT prophylaxis, hospital course became complicated by postop anemia requiring 1 unit of packed red blood cells.  He then developed metabolic encephalopathy with MRI showing an acute CVA, for which neurology was consulted.  EEG was overall unremarkable.  He then developed elevated troponins and cardiology.  Due to his patient encephalopathy his work-up was besides a stroke negative, his Lyrica was taper off, he ended up growing MSSA bacteremia and started on Ancef, his repeated blood cultures remain negative.  Assessment/Plan:   Acute metabolic encephalopathy slow to improve/acute right parietal lobe lobe infarct: CT of the head negative, MRI was positive for CVA. EEG was unremarkable, transcranial Doppler was negative. 2D echo showed preserved EF, he had an A1c of 6.1, LDL 59 TSH within normal limits. LP on 11/22/2019 showed no evidence of infection. He is off Lyrica. His encephalopathy is completely resolved likely due to underlying infectious etiology. He remains high risk for aspiration and delirium try to keep the head elevated above 45 degrees. Avoid narcotics and benzodiazepines at this could send them into acute confusional state. Awaiting insurance approval for skilled nursing facility placement.  Staph epidermidis bacteremia: Suspect likely due to hip repair. He was initially started on IV Ancef but he continues to spike fever and leukocytosis is rising it was discussed with  ID. He was transitioned to IV Zyvox he has defervesced and leukocytosis improving. 2D echo showed no signs of vegetation. Surveillance cultures have remained negative till date. Transition to Zyvox to oral, will need 2 weeks of total antibiotic treatment.  Updated 12/07/2019.  Acute kidney injury on chronic kidney disease stage III: With a baseline creatinine of 1.1, is improving with IV fluid hydration. KVO IV fluids a lot of oral fluid hydration.  Abdominal distention: Initially had nausea and vomiting which is now resolved. Abdominal x-ray is negative for ileus. Physical therapy and Occupational Therapy have evaluated the patient recommended skilled nursing facility.  Acute blood loss anemia postop: Status post 1 unit of packed red blood cells, Xarelto was held for DVT prophylaxis due to recent drop in hemoglobin. Orthopedic surgery to dictate when to restart anticoagulation for DVT prophylaxis.  Closed right hip fracture status post surgical repair on 11/19/2020: Status post right hip intramedullary nailing. Physical therapy evaluated the patient recommended skilled nursing facility.  Elevated troponin: Likely demand ischemia, cardiology following, 2D echo showed an EF of 60% with grade 2 diastolic heart failure. Now resolved he denies any chest pain or shortness of breath.  Sinus Tachycardia: Improved with IV fluid hydration likely due to hypovolemia.  Autonomic dysfunction: Continue Florinef.  Dysphagia: Continue aspiration precaution, speech is swallowing.  Coronary artery disease status post CABG: Currently chest pain-free.  Ambulatory dysfunction: Status post hip repair, physical therapy recommended skilled nursing facility.   DVT prophylaxis: lovenox Family Communication: Wife at bedside Status is: Inpatient  Remains inpatient appropriate because:Hemodynamically stable, awaiting insurance approval.   Dispo:  Patient From: Home  Planned Disposition: [facility]  Expected discharge date: 11/29/19  Medically stable for discharge: No  Code Status:     Code Status Orders  (From admission, onward)         Start     Ordered   11/15/19 2235  Do not attempt resuscitation (DNR)  Continuous       Question Answer Comment  In the event of cardiac or respiratory ARREST Do not call a "code blue"   In the event of cardiac or respiratory ARREST Do not perform Intubation, CPR, defibrillation or ACLS   In the event of cardiac or respiratory ARREST Use medication by any route, position, wound care, and other measures to relive pain and suffering. May use oxygen, suction and manual treatment of airway obstruction as needed for comfort.      11/15/19 2235        Code Status History    Date Active Date Inactive Code Status Order ID Comments User Context   06/24/2018 1449 06/27/2018 2006 Full Code [id number]  Truett Mainland, DO Inpatient   05/17/2017 1213 05/21/2017 1702 Full Code [id number]  Roxan Hockey, MD ED   02/17/2016 2059 02/20/2016 1545 Full Code [id number]  Karmen Bongo, MD Inpatient   07/03/2015 1530 07/06/2015 1402 Full Code [id number]  Consuelo Pandy, PA-C Inpatient   Advance Care Planning Activity    Advance Directive Documentation     Most Recent Value  Type of Advance Directive Living will  Pre-existing out of facility DNR order (yellow form or pink MOST form) --  "MOST" Form in Place? --        IV Access:    Peripheral IV   Procedures and diagnostic studies:   DG Swallowing Func-Speech Pathology  Result Date: 11/29/2019 Objective Swallowing Evaluation: Type of Study: MBS-Modified Barium Swallow Study  Patient Details Name: [name] MRN: [id number] Date of Birth: [birth year] Today's Date: 11/29/2019 Time: SLP Start Time (ACUTE ONLY): 1226 -SLP Stop Time (ACUTE ONLY): 1240 SLP Time Calculation (min) (ACUTE ONLY): 14 min Past Medical History: Past Medical History: Diagnosis Date . AAA (abdominal aortic  aneurysm) ([place])  . Aneurysm of iliac artery ([facility])  . CAD (coronary artery disease)   a. s/p CABGx4 in 07/2015. . Carotid artery disease ([place])  . Colon polyps  . Diabetes ([place])  . Difficult intubation  . Duodenal ulcer disease 08/15/2018 . Esophageal reflux  . High cholesterol  . History of IBS 02/27/2009 . Hypertension   pt denies, he says he has a h/o hypotension. If BP up he adjusts the Florinef . Jejunitis with partial SBO 05/20/2017 . Orthostatic hypotension  . Prostatitis  . Stroke ([place])  . TIA (transient ischemic attack)  . Upper GI bleed 06/24/2018 . Vitamin B 12 deficiency  . Vitamin D deficiency  Past Surgical History: Past Surgical History: Procedure Laterality Date . BIOPSY  06/25/2018  Procedure: BIOPSY;  Surgeon: Rush Landmark Telford Nab., MD;  Location: [facility];  Service: Gastroenterology;; . BIOPSY  04/24/2019  Procedure: BIOPSY;  Surgeon: Irving Copas., MD;  Location: [facility];  Service: Gastroenterology;; . BLADDER SURGERY  1969  traumatic pelvic fractures, urethral and bladder repair . CARDIAC CATHETERIZATION N/A 07/01/2015  Procedure: Left Heart Cath and Coronary Angiography;  Surgeon: Wellington Hampshire, MD;  Location: [facility];  Service: Cardiovascular;  Laterality: N/A; . COLON RESECTION N/A 05/17/2017  Procedure: DIAGNOSTIC LAPAROSCOPY,;  Surgeon: Leighton Ruff, MD;  Location: [facility];  Service: General;  Laterality: N/A; . COLONOSCOPY WITH PROPOFOL N/A 04/24/2019  Procedure: COLONOSCOPY WITH PROPOFOL;  Surgeon: Rush Landmark,  Telford Nab., MD;  Location: [facility];  Service: Gastroenterology;  Laterality: N/A; . CORONARY ANGIOPLASTY WITH STENT PLACEMENT   . CORONARY ARTERY BYPASS GRAFT N/A 07/06/2015  Procedure: CORONARY ARTERY BYPASS GRAFTING (CABG)x 4   utilizing the left internal mammary artery and endoscopically harvested bilateral  sapheneous vein.;  Surgeon: Ivin Poot, MD;  Location: [facility];  Service: Open Heart Surgery;  Laterality: N/A; . ESOPHAGEAL DILATION   04/24/2019  Procedure: ESOPHAGEAL DILATION;  Surgeon: Rush Landmark Telford Nab., MD;  [facility];  Service: Gastroenterology;; . ESOPHAGOGASTRODUODENOSCOPY (EGD) WITH PROPOFOL N/A 06/25/2018  Procedure: ESOPHAGOGASTRODUODENOSCOPY (EGD) WITH PROPOFOL;  Surgeon: Irving Copas., MD;  Location: [facility];  Service: Gastroenterology;  Laterality: N/A; . ESOPHAGOGASTRODUODENOSCOPY (EGD) WITH PROPOFOL N/A 04/24/2019  Procedure: ESOPHAGOGASTRODUODENOSCOPY (EGD) WITH PROPOFOL;  Surgeon: Rush Landmark Telford Nab., MD;  [facility];  Service: Gastroenterology;  Laterality: N/A; . FEMUR IM NAIL Right 11/16/2019  Procedure: RIGHT HIP INTRAMEDULLARY (IM) NAIL FEMORAL;  Surgeon: Meredith Pel, MD;  Location: [facility];  Service: Orthopedics;  Laterality: Right; . HEMOSTASIS CLIP PLACEMENT  04/24/2019  Procedure: HEMOSTASIS CLIP PLACEMENT;  Surgeon: Irving Copas., MD;  [facility];  Service: Gastroenterology;; . KNEE SURGERY   . LOOP RECORDER INSERTION N/A 02/19/2018  Procedure: LOOP RECORDER INSERTION;  Surgeon: Deboraha Sprang, MD;  Location: [facility];  Service: Cardiovascular;  Laterality: N/A; . POLYPECTOMY  04/24/2019  Procedure: POLYPECTOMY;  Surgeon: Rush Landmark Telford Nab., MD;  Location[facility];  Service: Gastroenterology;; . Lia Foyer LIFTING INJECTION  04/24/2019  Procedure: SUBMUCOSAL LIFTING INJECTION;  Surgeon: Irving Copas., MD;  [facility];  Service: Gastroenterology;; . TEE WITHOUT CARDIOVERSION N/A 07/06/2015  Procedure: TRANSESOPHAGEAL ECHOCARDIOGRAM (TEE);  Surgeon: Ivin Poot, MD;  Location: [facility];  Service: Open Heart Surgery;  Laterality: N/A; HPI:  86 y.o. male with a history of diet-controlled type 2 diabetes with stage III chronic kidney disease and diabetic neuropathy, coronary artery disease status post bypass, CVA.  Patient presents with right hip fx after a mechanical fall.  Underwent right hip IM nail  placement 7/17. Now with acute metabolic encephalopathy.  [name] present, she reports hx of increasing swallowing difficulty in the last several months, with coughing/choking during meals. MRI brain 7/20: Multifocal acute right cerebellar infarcts. Punctate acute medial left parietal lobe infarct. MBS 7./2: Moderate oropharyngeal dysphagia, also needing Mod cues for arousal throughout session. His labial seal is weak around a cup, as liquids spill down his chin. He has improved seal with a straw and spoon but still has slow lingual manipulation, taking multiple, pumping efforts to push boluses posteriorly. Bolus cohesion is poor especially with liquids of all consistencies. Swallow initiation is triggered at the pyriform sinuses with thin and nectar thick liquids.  Subjective: lethargic Assessment / Plan / Recommendation [facility] IP CLINICAL IMPRESSIONS 11/29/2019 Clinical Impression Pt presents with oropharyngeal dysphagia characterized by impaired bolus cohesion, an oral transit delay, reduced anterior laryngeal moevement, and a pharyngeal delay. He demonstrated lingual rocking, premature spilage to the valleculae and pyriform sinuses with the swallow often triggered with the head of the bolus at the pyriform sinuses, and silent aspiration (PAS 8) with thin liquids via cup and with nectar thick liquids via straw secondary to premature spillage and the pharyngeal delay. Transient penetration (PAS 2) was noted with nectar thick liquids via cup. Depth of laryngeal invasion with liquids was improved with a chin tuck posture but pt was unable to consistently demonstrate this even with verbal and  tactile cueing. A dysphagia 3 diet with nectar thick liquids is recommended with strict observance of swallowing precautions including, but not limited to, avoidance of straws. SLP will continue to follow pt for dysphagia treatment.  SLP Visit Diagnosis Dysphagia, oropharyngeal phase (R13.12) Attention and concentration deficit  following -- Frontal lobe and executive function deficit following -- Impact on safety and function Moderate aspiration risk   [facility] IP TREATMENT RECOMMENDATION 11/29/2019 Treatment Recommendations Therapy as outlined in treatment plan below   Prognosis 11/29/2019 Prognosis for Safe Diet Advancement Good Barriers to Reach Goals Cognitive deficits Barriers/Prognosis Comment -- [facility] IP DIET RECOMMENDATION 11/29/2019 SLP Diet Recommendations Dysphagia 3 (Mech soft) solids;Nectar thick liquid Liquid Administration via Cup;No straw Medication Administration Whole meds with puree Compensations Slow rate;Small sips/bites Postural Changes Seated upright at 90 degrees   [facility] IP OTHER RECOMMENDATIONS 11/29/2019 Recommended Consults -- Oral Care Recommendations Oral care BID Other Recommendations Remove water pitcher;Prohibited food (jello, ice cream, thin soups);Order thickener from pharmacy   [facility] IP FOLLOW UP RECOMMENDATIONS 11/29/2019 Follow up Recommendations Skilled Nursing facility   [facility] IP FREQUENCY AND DURATION 11/29/2019 Speech Therapy Frequency (ACUTE ONLY) min 2x/week Treatment Duration 2 weeks      [facility] IP ORAL PHASE 11/29/2019 Oral Phase Impaired Oral - Pudding Teaspoon -- Oral - Pudding Cup -- Oral - Honey Teaspoon -- Oral - Honey Cup Decreased bolus cohesion;Premature spillage Oral - Nectar Teaspoon -- Oral - Nectar Cup Decreased bolus cohesion;Premature spillage Oral - Nectar Straw Decreased bolus cohesion;Premature spillage Oral - Thin Teaspoon -- Oral - Thin Cup Decreased bolus cohesion;Premature spillage Oral - Thin Straw Decreased bolus cohesion;Premature spillage Oral - Puree Decreased bolus cohesion;Premature spillage;Delayed oral transit Oral - Mech Soft Decreased bolus cohesion Oral - Regular -- Oral - Multi-Consistency -- Oral - Pill -- Oral Phase - Comment --  [facility] IP PHARYNGEAL PHASE 11/29/2019 Pharyngeal Phase Impaired Pharyngeal- Pudding Teaspoon -- Pharyngeal -- Pharyngeal- Pudding Cup -- Pharyngeal --  Pharyngeal- Honey Teaspoon -- Pharyngeal -- Pharyngeal- Honey Cup Delayed swallow initiation-vallecula;Delayed swallow initiation-pyriform sinuses;Reduced anterior laryngeal mobility;Penetration/Aspiration during swallow Pharyngeal Material enters airway, remains ABOVE vocal cords then ejected out Pharyngeal- Nectar Teaspoon -- Pharyngeal -- Pharyngeal- Nectar Cup Delayed swallow initiation-vallecula;Delayed swallow initiation-pyriform sinuses;Reduced anterior laryngeal mobility;Penetration/Aspiration during swallow Pharyngeal Material enters airway, remains ABOVE vocal cords then ejected out Pharyngeal- Nectar Straw Delayed swallow initiation-vallecula;Delayed swallow initiation-pyriform sinuses;Reduced anterior laryngeal mobility;Penetration/Aspiration during swallow Pharyngeal Material enters airway, passes BELOW cords without attempt by patient to eject out (silent aspiration) Pharyngeal- Thin Teaspoon -- Pharyngeal -- Pharyngeal- Thin Cup Delayed swallow initiation-vallecula;Delayed swallow initiation-pyriform sinuses;Reduced anterior laryngeal mobility;Penetration/Aspiration during swallow Pharyngeal Material enters airway, passes BELOW cords without attempt by patient to eject out (silent aspiration) Pharyngeal- Thin Straw NT Pharyngeal -- Pharyngeal- Puree Delayed swallow initiation-vallecula;Reduced anterior laryngeal mobility Pharyngeal -- Pharyngeal- Mechanical Soft NT Pharyngeal -- Pharyngeal- Regular Reduced anterior laryngeal mobility Pharyngeal -- Pharyngeal- Multi-consistency -- Pharyngeal -- Pharyngeal- Pill -- Pharyngeal -- Pharyngeal Comment --  CH[facility] IP CERVICAL ESOPHAGEAL PHASE 11/29/2019 Cervical Esophageal Phase WFL Pudding Teaspoon -- Pudding Cup -- Honey Teaspoon -- Honey Cup -- Nectar Teaspoon -- Nectar Cup -- Nectar Straw -- Thin Teaspoon -- Thin Cup -- Thin Straw -- Puree -- Mechanical Soft -- Regular -- Multi-consistency -- Pill -- Cervical Esophageal Comment -- Shanika [name], [place],  [place] Office number [phone] Pager [phone] Horton Marshall 11/29/2019, 1:24 PM                Medical Consultants:  None.  Anti-Infectives:   Zyvox IV  Subjective:    [name] has no new complaints this morning.  Objective:    Vitals:   11/29/19 1741 11/29/19 1956 11/30/19 0235 11/30/19 0509  BP: (!) 166/86 (!) 155/88  (!) 154/87  Pulse: 96 95  92  Resp:  19  19  Temp:  99 F (37.2 C)  99.2 F (37.3 C)  TempSrc:  Oral  Oral  SpO2:  94%  92%  Weight:   (!) 94.5 kg   Height:       SpO2: 92 % O2 Flow Rate (L/min): 2 L/min   Intake/Output Summary (Last 24 hours) at 11/30/2019 0908 Last data filed at 11/30/2019 0254 Gross per 24 hour  Intake 1953.99 ml  Output 850 ml  Net 1103.99 ml   Filed Weights   11/28/19 0100 11/29/19 0046 11/30/19 0235  Weight: (!) 97.3 kg (!) 95.8 kg (!) 94.5 kg    Exam: General exam: In no acute distress. Respiratory system: Good air movement and clear to auscultation. Cardiovascular system: S1 & S2 heard, RRR. No JVD. Gastrointestinal system: Abdomen is nondistended, soft and nontender.  Extremities: No pedal edema. Skin: No rashes, lesions or ulcers Psychiatry: Judgement and insight appear normal. Mood & affect appropriate.   Data Reviewed:    Labs: Basic Metabolic Panel: Recent Labs  Lab 11/26/19 0423 11/26/19 0423 11/27/19 5681 11/27/19 0552 11/28/19 0425 11/28/19 0425 11/29/19 0040 11/30/19 0354  NA 144  --  144  --  145  --  146* 142  K 3.2*   < > 3.0*   < > 3.6   < > 3.3* 3.5  CL 110  --  109  --  111  --  110 108  CO2 26  --  27  --  26  --  27 26  GLUCOSE 151*  --  146*  --  131*  --  152* 148*  BUN 22  --  24*  --  29*  --  30* 26*  CREATININE 1.08  --  1.23  --  1.27*  --  1.25* 1.16  CALCIUM 8.0*  --  8.1*  --  8.3*  --  8.2* 7.9*  MG 2.0  --  2.2  --  2.4  --  2.2 2.0   < > = values in this interval not displayed.   GFR Estimated Creatinine  Clearance: 56.6 mL/min (by C-G formula based on SCr of 1.16 mg/dL). Liver Function Tests: No results for input(s): AST, ALT, ALKPHOS, BILITOT, PROT, ALBUMIN in the last 168 hours. No results for input(s): LIPASE, AMYLASE in the last 168 hours. No results for input(s): AMMONIA in the last 168 hours. Coagulation profile No results for input(s): INR, PROTIME in the last 168 hours. COVID-19 Labs  No results for input(s): DDIMER, FERRITIN, LDH, CRP in the last 72 hours.  Lab Results  Component Value Date   SARSCOV2NAA NEGATIVE 11/27/2019   [place] NEGATIVE 11/15/2019   SARSCOV2NAA NOT DETECTED 04/20/2019   [place] Not Detected 09/30/2018    CBC: Recent Labs  Lab 11/26/19 0423 11/27/19 0552 11/28/19 0425 11/29/19 0040 11/30/19 0354  WBC 17.9* 18.4* 16.5* 14.5* 11.6*  HGB 9.4* 8.9* 9.1* 9.0* 8.7*  HCT 29.0* 28.1* 28.9* 29.8* 28.4*  MCV 94.8 94.9 96.0 97.4 96.6  PLT 389 387 430* 473* 429*   Cardiac Enzymes: No results for input(s): CKTOTAL, CKMB, CKMBINDEX, TROPONINI in the last 168 hours. BNP (last 3 results) No results for  input(s): PROBNP in the last 8760 hours. CBG: Recent Labs  Lab 11/29/19 1146 11/29/19 1612 11/29/19 1952 11/30/19 0002 11/30/19 0506  GLUCAP 164* 122* 132* 123* 132*   D-Dimer: No results for input(s): DDIMER in the last 72 hours. Hgb A1c: No results for input(s): HGBA1C in the last 72 hours. Lipid Profile: No results for input(s): CHOL, HDL, LDLCALC, TRIG, CHOLHDL, LDLDIRECT in the last 72 hours. Thyroid function studies: No results for input(s): TSH, T4TOTAL, T3FREE, THYROIDAB in the last 72 hours.  Invalid input(s): FREET3 Anemia work up: No results for input(s): VITAMINB12, FOLATE, FERRITIN, TIBC, IRON, RETICCTPCT in the last 72 hours. Sepsis Labs: Recent Labs  Lab 11/27/19 0552 11/28/19 0425 11/29/19 0040 11/30/19 0354  WBC 18.4* 16.5* 14.5* 11.6*   Microbiology Recent Results (from the past 240 hour(s))  Culture, blood  (Routine X 2) w Reflex to ID Panel     Status: Abnormal   Collection Time: 11/21/19  9:09 AM   Specimen: BLOOD RIGHT HAND  Result Value Ref Range Status   Specimen Description BLOOD RIGHT HAND  Final   Special Requests   Final    BOTTLES DRAWN AEROBIC ONLY Blood Culture adequate volume   Culture  Setup Time   Final    GRAM POSITIVE COCCI IN CLUSTERS AEROBIC BOTTLE ONLY CRITICAL RESULT CALLED TO, READ BACK BY AND VERIFIED WITH: Sarajane Marek [phone]  Performed at [facility], [address], [place], [place] [address]    Culture STAPHYLOCOCCUS EPIDERMIDIS (A)  Final   Report Status 11/24/2019 FINAL  Final   Organism ID, Bacteria STAPHYLOCOCCUS EPIDERMIDIS  Final      Susceptibility   Staphylococcus epidermidis - MIC*    CIPROFLOXACIN >=8 RESISTANT Resistant     ERYTHROMYCIN <=0.25 SENSITIVE Sensitive     GENTAMICIN <=0.5 SENSITIVE Sensitive     OXACILLIN <=0.25 SENSITIVE Sensitive     TETRACYCLINE <=1 SENSITIVE Sensitive     VANCOMYCIN 1 SENSITIVE Sensitive     TRIMETH/SULFA 160 RESISTANT Resistant     CLINDAMYCIN <=0.25 SENSITIVE Sensitive     RIFAMPIN <=0.5 SENSITIVE Sensitive     Inducible Clindamycin NEGATIVE Sensitive     * STAPHYLOCOCCUS EPIDERMIDIS  Blood Culture ID Panel (Reflexed)     Status: Abnormal   Collection Time: 11/21/19  9:09 AM  Result Value Ref Range Status   Enterococcus species NOT DETECTED NOT DETECTED Final   Listeria monocytogenes NOT DETECTED NOT DETECTED Final   Staphylococcus species DETECTED (A) NOT DETECTED Final    Comment: Methicillin (oxacillin) susceptible coagulase negative staphylococcus. Possible blood culture contaminant (unless isolated from more than one blood culture draw or clinical case suggests pathogenicity). No antibiotic treatment is indicated for blood  culture contaminants. CRITICAL RESULT CALLED TO, READ BACK BY AND VERIFIED WITH: Sarajane Marek [id number] AT [phone] SK     Staphylococcus aureus (BCID) NOT DETECTED NOT  DETECTED Final   Methicillin resistance NOT DETECTED NOT DETECTED Final   Streptococcus species NOT DETECTED NOT DETECTED Final   Streptococcus agalactiae NOT DETECTED NOT DETECTED Final   Streptococcus pneumoniae NOT DETECTED NOT DETECTED Final   Streptococcus pyogenes NOT DETECTED NOT DETECTED Final   Acinetobacter baumannii NOT DETECTED NOT DETECTED Final   Enterobacteriaceae species NOT DETECTED NOT DETECTED Final   Enterobacter cloacae complex NOT DETECTED NOT DETECTED Final   Escherichia coli NOT DETECTED NOT DETECTED Final   Klebsiella oxytoca NOT DETECTED NOT DETECTED Final   Klebsiella pneumoniae NOT DETECTED NOT DETECTED Final  Proteus species NOT DETECTED NOT DETECTED Final   Serratia marcescens NOT DETECTED NOT DETECTED Final   Haemophilus influenzae NOT DETECTED NOT DETECTED Final   Neisseria meningitidis NOT DETECTED NOT DETECTED Final   Pseudomonas aeruginosa NOT DETECTED NOT DETECTED Final   Candida albicans NOT DETECTED NOT DETECTED Final   Candida glabrata NOT DETECTED NOT DETECTED Final   Candida krusei NOT DETECTED NOT DETECTED Final   Candida parapsilosis NOT DETECTED NOT DETECTED Final   Candida tropicalis NOT DETECTED NOT DETECTED Final    Comment: Performed at [facility], [place] [address] [place], [place] [address]  Culture, blood (Routine X 2) w Reflex to ID Panel     Status: Abnormal   Collection Time: 11/21/19  9:13 AM   Specimen: BLOOD LEFT HAND  Result Value Ref Range Status   Specimen Description BLOOD LEFT HAND  Final   Special Requests   Final    BOTTLES DRAWN AEROBIC ONLY Blood Culture results may not be optimal due to an inadequate volume of blood received in culture bottles   Culture  Setup Time   Final    GRAM POSITIVE COCCI IN CLUSTERS AEROBIC BOTTLE ONLY CRITICAL VALUE NOTED.  VALUE IS CONSISTENT WITH PREVIOUSLY REPORTED AND CALLED VALUE.    Culture (A)  Final    STAPHYLOCOCCUS EPIDERMIDIS SUSCEPTIBILITIES PERFORMED ON PREVIOUS  CULTURE WITHIN THE LAST 5 DAYS. Performed at [facility], [place] [address], [place], [place] [address]    Report Status 11/24/2019 FINAL  Final  Anaerobic culture     Status: None   Collection Time: 11/22/19  5:15 PM   Specimen: PATH Cytology CSF; Cerebrospinal Fluid  Result Value Ref Range Status   Specimen Description CSF  Final   Special Requests NONE  Final   Culture   Final    NO ANAEROBES ISOLATED Performed at [facility], [place] [address], [place], [place] [address]    Report Status 11/27/2019 FINAL  Final  CSF culture     Status: None   Collection Time: 11/22/19  5:15 PM   Specimen: PATH Cytology CSF; Cerebrospinal Fluid  Result Value Ref Range Status   Specimen Description CSF  Final   Special Requests NONE  Final   Gram Stain   Final    WBC PRESENT, PREDOMINANTLY MONONUCLEAR NO ORGANISMS SEEN CYTOSPIN SMEAR    Culture   Final    NO GROWTH 3 DAYS Performed at [facility], [place] [address] [place], [place] [address]    Report Status 11/26/2019 FINAL  Final  Culture, fungus without smear     Status: None (Preliminary result)   Collection Time: 11/22/19  5:15 PM   Specimen: PATH Cytology CSF; Cerebrospinal Fluid  Result Value Ref Range Status   Specimen Description CSF  Final   Special Requests NONE  Final   Culture   Final    NO FUNGUS ISOLATED AFTER 7 DAYS Performed at [facility], [place] [address] [place], [place] [address]    Report Status PENDING  Incomplete  Culture, blood (Routine X 2) w Reflex to ID Panel     Status: None   Collection Time: 11/24/19  5:45 AM   Specimen: BLOOD  Result Value Ref Range Status   Specimen Description BLOOD RIGHT ANTECUBITAL  Final   Special Requests   Final    BOTTLES DRAWN AEROBIC AND ANAEROBIC Blood Culture adequate volume   Culture   Final    NO GROWTH 5  DAYS Performed at [facility], [place] [address] [place], [place] [address]    Report Status 11/29/2019 FINAL  Final  Culture, blood  (Routine X 2) w Reflex to ID Panel     Status: None   Collection Time: 11/24/19  5:46 AM   Specimen: BLOOD LEFT HAND  Result Value Ref Range Status   Specimen Description BLOOD LEFT HAND  Final   Special Requests   Final    BOTTLES DRAWN AEROBIC AND ANAEROBIC Blood Culture adequate volume   Culture   Final    NO GROWTH 5 DAYS Performed at [facility], [place] [address], [place], [place] [address]    Report Status 11/29/2019 FINAL  Final  SARS Coronavirus 2 by RT PCR (hospital order, performed in [facility] hospital lab) Nasopharyngeal Nasopharyngeal Swab     Status: None   Collection Time: 11/27/19  5:20 PM   Specimen: Nasopharyngeal Swab  Result Value Ref Range Status   SARS Coronavirus 2 NEGATIVE NEGATIVE Final    Comment: (NOTE) SARS-CoV-2 target nucleic acids are NOT DETECTED.  The SARS-CoV-2 RNA is generally detectable in upper and lower respiratory specimens during the acute phase of infection. The lowest concentration of SARS-CoV-2 viral copies this assay can detect is 250 copies / mL. A negative result does not preclude SARS-CoV-2 infection and should not be used as the sole basis for treatment or other patient management decisions.  A negative result may occur with improper specimen collection / handling, submission of specimen other than nasopharyngeal swab, presence of viral mutation(s) within the areas targeted by this assay, and inadequate number of viral copies (<250 copies / mL). A negative result must be combined with clinical observations, patient history, and epidemiological information.  Fact Sheet for Patients:   StrictlyIdeas.no  Fact Sheet for Healthcare Providers: BankingDealers.co.za  This test is not yet approved or  cleared by the [place] FDA and has been authorized for detection and/or diagnosis of SARS-CoV-2 by FDA under an Emergency Use Authorization (EUA).  This EUA will remain in effect  (meaning this test can be used) for the duration of the COVID-19 declaration under Section 564(b)(1) of the Act, 21 U.S.C. section 360bbb-3(b)(1), unless the authorization is terminated or revoked sooner.  Performed at [facility], [place] [address] [place], [place] [address]      Medications:   . aspirin EC  81 mg Oral Daily  . atorvastatin  80 mg Oral Daily  . docusate sodium  100 mg Oral BID  . feeding supplement (ENSURE ENLIVE)  237 mL Oral BID BM  . fludrocortisone  0.1 mg Oral Daily  . insulin aspart  0-9 Units Subcutaneous Q4H  . multivitamin with minerals  1 tablet Oral Daily  . pantoprazole  40 mg Oral Daily  . polyethylene glycol  17 g Oral Daily  . rivaroxaban  10 mg Oral Daily   Continuous Infusions: . sodium chloride 50 mL/hr at 11/30/19 0324  . linezolid (ZYVOX) IV 600 mg (11/29/19 2223)      LOS: 15 days   Charlynne Cousins  [facility]  11/30/2019, 9:08 AM

## 2019-11-30 NOTE — Progress Notes (Addendum)
Patient stable.  Seems more lucid than he was last week Right hip with minimal pain with circumduction. No abnormal right thigh swelling Patient is now about 2 weeks out from surgery.  Plan on suture removal on Monday and plain radiographs today to evaluate the fracture.  We will likely be able to increase his weightbearing prior to discharge to skilled nursing.  Back on Xarelto.  Discontinue aspirin.

## 2019-11-30 NOTE — TOC Progression Note (Signed)
Transition of Care [facility]) - Progression Note    Patient Details  Name: [name] MRN: [id number] Date of Birth: [birth year]  Transition of Care [facility]) CM/SW [place], LCSW Phone Number: 11/30/2019, 9:44 AM  Clinical Narrative:     CSW contacted [employer] and patient's [name] is back. CSW alerted MD Olevia Bowens and he advised patient is ready for discharge. CSW left voicemail for Elyse Hsu at [facility]; awaiting to hear back regarding bed availability and information.   Everlene Balls #- [id number] Health Plan #- [id number] Case Manager: Doran Clay Authorization dates: 7/30 - 8/3 Fax Number: [phone]   TOC team will continue to assist with discharge planning needs.   Expected Discharge Plan: [place] Barriers to Discharge: Continued Medical Work up  Expected Discharge Plan and Services Expected Discharge Plan: [place] arrangements for the past 2 months: Single Family Home                                       Social Determinants of Health (SDOH) Interventions    Readmission Risk Interventions No flowsheet data found.

## 2019-11-30 NOTE — Progress Notes (Signed)
Pt. assisted to sit on the side of the bed 2 person max assistance. Pt and daughter  became dizzy stated that was his normal for him. Laid back in the bed, asked pt the patient if he wanted to sit in the chair using the lift and he refused. Wife at the bedside.

## 2019-11-30 NOTE — Discharge Summary (Signed)
Physician Discharge Summary  [name] HWE:[id number] DOB: [birth year] DOA: 11/15/2019  PCP: [name], MD  Admit date: 11/15/2019 Discharge date: 11/30/2019  Admitted From: Home Disposition:  SNF  Recommendations for Outpatient Follow-up:  1. Follow up with PCP in 1-2 weeks 2. Please obtain BMP/CBC in one week 3. Please follow up on the following pending results:  Home Health:No Equipment/Devices:None  Discharge Condition:Stable CODE STATUS:Full Diet recommendation: Heart Healthy  Brief/Interim Summary: 86 y.o. male past medical history of essential hypertension, CAD status post CABG in 2017, CVA TIA, AAA, history of GI bleed carotid stenosis, chronic kidney disease stage IIIa admitted to the hospital secondary to mechanical fall leading to right hip fracture that requires surgical repair on 11/16/2019 started on postop DVT prophylaxis, hospital course became complicated by postop anemia requiring 1 unit of packed red blood cells.  He then developed metabolic encephalopathy with MRI showing an acute CVA, for which neurology was consulted.  EEG was overall unremarkable.  He then developed elevated troponins and cardiology.  Due to his patient encephalopathy his work-up was besides a stroke negative, his Lyrica was taper off, he ended up growing MSSA bacteremia and started on Ancef, his repeated blood cultures remain negative.  Discharge Diagnoses:  Principal Problem:   Closed right hip fracture ([place]) Active Problems:   GERD   Supine hypertension   PVD (peripheral vascular disease) (HCC)   Coronary artery disease involving coronary bypass graft of native heart without angina pectoris   Diabetes mellitus type 2, diet-controlled ([place])   CKD stage 3 due to type 2 diabetes mellitus (HCC)   Dysautonomia orthostatic hypotension syndrome (HCC)   Diabetic neuropathy (HCC)   History of lacunar cerebrovascular accident (CVA)   Elevated troponin   Stroke (cerebrum) (HCC)    Bacteremia due to Staphylococcus epidermidis  Acute metabolic encephalopathy/right parietal infarct: CT of the head negative for CVA, and MRI was done that was positive. Neurology was consulted recommended aspirin, EEG was unremarkable. 2D echo showed a preserved EF his LDL was greater than 50 he was started on a statin. LP done on 11/22/2019 showed no evidence of infection. He recommended to start a statin and aspirin which she will continue as an outpatient.  For secondary stroke prevention. His Lyrica was discontinued and he slowly started to wake up. He will stay off Lyrica until he has stabilized and we will can resume it in 2 to 4 weeks for peripheral neuropathy. He still remains at high risk of aspiration and delirium. Try to avoid narcotics and benzodiazepines. Physical therapy evaluated the patient and recommended skilled nursing facility.  Staph epidermidis bacteremia: Likely due to surgery, ID was consulted he was started on IV Ancef, repeated blood cultures remain negative he was transitioned to Zyvox which she will continue orally for 2 weeks with a stop date on 11/08/2019 as an outpatient. A 2D echo was done that showed no vegetation. Surveillance blood cultures remain negative.  Acute kidney injury on chronic kidney disease stage III: With a baseline creatinine 1.1 likely prerenal azotemia resolved with IV fluid hydration.  Abdominal distention with nausea and vomiting: now resolved abdominal x-ray was done that was negative for ileus. He was treated temporarily with Reglan.  Acute blood loss anemia postop: He status post 1 unit of packed red blood cells his hemoglobin was repeated and remained stable. We will continue Xarelto as an outpatient for DVT prophylaxis for 10 days.  Close hip fracture status post surgical repair on 11/19/2020: Status post right hip  intramedullary nailing. Physical therapy evaluated the patient recommended skilled nursing facility.  Elevated  troponins: Slight likely demand ischemia cardiology was consulted recommended a 2D echo that showed a preserved EF with grade 2 diastolic heart failure he denied any chest pain or shortness of breath. No further history ischemic work-up recommended.  In his tachycardia: Likely due to hypovolemia resolved with IV fluid hydration.  Autonomic dysfunction: He was discontinued, once he stabilized, his Flonase was resumed and he will continue as an outpatient.  Dysphagia: Speech evaluated the patient they recommended to continue aspiration precaution.   Orthopedic surgery recommended Xarelto for DVT prophylaxis.   Discharge Instructions  Discharge Instructions    Diet - low sodium heart healthy   Complete by: As directed    Increase activity slowly   Complete by: As directed    No wound care   Complete by: As directed      Allergies as of 11/30/2019      Reactions   Other Other (See Comments)   Pt reports allergic to a medication but unsure of name or type of med   Cymbalta [duloxetine Hcl] Other (See Comments)   Dizziness, hallucinations.    Keflex [cephalexin] Other (See Comments)   dizziness   Simvastatin Other (See Comments)   Joint pain   Sudafed [pseudoephedrine] Other (See Comments)   Dizziness      Medication List    STOP taking these medications   aspirin EC 81 MG tablet   olmesartan 40 MG tablet Commonly known as: BENICAR   pravastatin 40 MG tablet Commonly known as: Pravachol   pregabalin 100 MG capsule Commonly known as: LYRICA     TAKE these medications   acetaminophen 500 MG tablet Commonly known as: TYLENOL Take 1,000 mg by mouth in the morning and at bedtime.   atorvastatin 80 MG tablet Commonly known as: LIPITOR Take 1 tablet (80 mg total) by mouth daily. Start taking on: December 01, 2019   CALCIUM+D3 PO Take 1 tablet by mouth daily.   ferrous sulfate 325 (65 FE) MG tablet Take 325 mg by mouth daily with breakfast.   Fish Oil 600 MG  Caps Take 1 capsule by mouth daily.   fludrocortisone 0.1 MG tablet Commonly known as: FLORINEF Take 2 tablets Daily for Low BP What changed:   how much to take  how to take this  when to take this  additional instructions   glucose blood test strip Check blood sugar 1 time daily-DX-E11.9 What changed:   how much to take  how to take this  when to take this  reasons to take this   linezolid 600 MG tablet Commonly known as: ZYVOX Take 1 tablet (600 mg total) by mouth every 12 (twelve) hours for 9 days.   multivitamin with minerals Tabs tablet Take 1 tablet by mouth daily.   pantoprazole 40 MG tablet Commonly known as: PROTONIX Take 1 tablet Daily for Indigestion & Heartburn What changed:   how much to take  how to take this  when to take this  additional instructions   polyethylene glycol 17 g packet Commonly known as: MIRALAX / GLYCOLAX Take 17 g by mouth daily. What changed: when to take this   rivaroxaban 10 MG Tabs tablet Commonly known as: XARELTO Take 1 tablet (10 mg total) by mouth daily. Start taking on: December 01, 2019   Vitamin D-3 125 MCG (5000 UT) Tabs Take 5,000 Units by mouth daily.   Zinc 50 MG Tabs Take  1 tablet by mouth daily.       Contact information for after-discharge care    Destination    [facility] .   Service: Skilled Nursing Contact information: [address] [place] [phone]                 Allergies  Allergen Reactions  . Other Other (See Comments)    Pt reports allergic to a medication but unsure of name or type of med  . Cymbalta [Duloxetine Hcl] Other (See Comments)    Dizziness, hallucinations.   . Keflex [Cephalexin] Other (See Comments)    dizziness  . Simvastatin Other (See Comments)    Joint pain  . Sudafed [Pseudoephedrine] Other (See Comments)    Dizziness    Consultations:  Orthopedic  surgery  Cardiology  Neurology     Procedures/Studies: EEG  Result Date: 11/19/2019 Lora Havens, MD     11/19/2019 12:43 PM Patient Name: [name] MRN: [id number] Epilepsy Attending: Lora Havens Referring Physician/Provider: Dr Irene Pap Date: 11/19/2019 Duration: 24.33 mins Patient history: 86yo M with persistent encephalopathy after closed right hip fracture post surgical repair on 11/16/2019. EEG to evaluate for seizure. EEG to evaluate for seizure Level of alertness: Awake, asleep AEDs during EEG study: Pregabalin Technical aspects: This EEG study was done with scalp electrodes positioned according to the 10-20 International system of electrode placement. Electrical activity was acquired at a sampling rate of 500Hz  and reviewed with a high frequency filter of 70Hz  and a low frequency filter of 1Hz . EEG data were recorded continuously and digitally stored. Description: The posterior dominant rhythm consists of 8 Hz activity of moderate voltage (25-35 uV) seen predominantly in posterior head regions, symmetric and reactive to eye opening and eye closing. Sleep was characterized by vertex waves, sleep spindles (12 to 14 Hz), maximal frontocentral region. EEG showed intermittent generalized 3 to 6 Hz theta-delta slowing. Hyperventilation and photic stimulation were not performed.   ABNORMALITY -Intermittent slow, generalized IMPRESSION: This study is suggestive of mild diffuse encephalopathy nonspecific etiology. No seizures or epileptiform discharges were seen throughout the recording. Lora Havens   DG Abd 1 View  Result Date: 11/25/2019 CLINICAL DATA:  Abdominal distension. EXAM: ABDOMEN - 1 VIEW COMPARISON:  None. FINDINGS: The bowel gas pattern is normal. Residual contrast is noted throughout the colon. No radio-opaque calculi or other significant radiographic abnormality are seen. IMPRESSION: No evidence of bowel obstruction or ileus. Electronically Signed   By: Marijo Conception M.D.   On: 11/25/2019 14:31   CT HEAD WO CONTRAST  Result Date: 11/22/2019 CLINICAL DATA:  Altered mental status. EXAM: CT HEAD WITHOUT CONTRAST TECHNIQUE: Contiguous axial images were obtained from the base of the skull through the vertex without intravenous contrast. COMPARISON:  11/18/2019 FINDINGS: Brain: Stable moderately enlarged ventricles and subarachnoid spaces. Stable marked patchy white matter low density in both cerebral hemispheres. Stable old left basal ganglia and right external capsule and corona radiata infarcts. No intracranial hemorrhage, mass lesion or CT evidence of acute infarction. Vascular: No hyperdense vessel or unexpected calcification. Skull: Normal. Negative for fracture or focal lesion. Sinuses/Orbits: Status post bilateral cataract extraction. Unremarkable bones and included paranasal sinuses. Other: None. IMPRESSION: 1. No acute abnormality. 2. Stable atrophy, chronic small vessel white matter ischemic changes and old infarcts. Electronically Signed   By: Claudie Revering M.D.   On: 11/22/2019 16:37   CT HEAD WO CONTRAST  Result Date: 11/18/2019 CLINICAL DATA:  Encephalopathy EXAM: CT HEAD WITHOUT CONTRAST TECHNIQUE: Contiguous axial images were obtained from the base of the skull through the vertex without intravenous contrast. COMPARISON:  Head CT 09/25/2019, brain MRI 09/11/2019. FINDINGS: Brain: Stable, mild generalized parenchymal atrophy. Redemonstrated chronic small vessel infarcts within the corona radiata/basal ganglia, thalami and cerebellum. Stable background advanced patchy and confluent hypoattenuation within the cerebral white matter which is nonspecific, but consistent with chronic small vessel ischemic disease. To a lesser degree, chronic small vessel ischemic changes are also present within the pons. There is no acute intracranial hemorrhage. No acute demarcated cortical infarct is identified. No extra-axial fluid collection. No evidence of intracranial  mass. No midline shift. Vascular: No hyperdense vessel.  Atherosclerotic calcifications. Skull: Normal. Negative for fracture or focal lesion. Sinuses/Orbits: Visualized orbits show no acute finding. Tiny left maxillary sinus mucous retention cyst. Minimal ethmoid sinus mucosal thickening. No significant mastoid effusion. IMPRESSION: No CT evidence of acute intracranial abnormality. Stable appearance of advanced chronic small vessel ischemic disease. Redemonstrated remote small-vessel infarcts within the corona radiata/basal ganglia, thalami and cerebellum. Tiny left maxillary sinus mucous retention cyst. Minimal ethmoid sinus mucosal thickening. Electronically Signed   By: Kellie Simmering DO   On: 11/18/2019 14:36   CT PELVIS WO CONTRAST  Result Date: 11/24/2019 CLINICAL DATA:  Pelvic fracture, recent hip surgery. EXAM: CT PELVIS WITHOUT CONTRAST TECHNIQUE: Multidetector CT imaging of the pelvis was performed following the standard protocol without intravenous contrast. COMPARISON:  Plain films 11/15/2019 and CT 09/11/2019 FINDINGS: Urinary Tract:  No abnormality visualized. Bowel:  Unremarkable visualized pelvic bowel loops. Vascular/Lymphatic: Calcified plaque over the abdominal aorta and iliac arteries. Aneurysmal dilatation of the visualized infrarenal abdominal aorta measuring 4.4 cm in AP diameter. Dilatation of the common iliac arteries measuring 2.7 cm on the left and 2.9 cm on the right unchanged. Reproductive:  No mass or other significant abnormality Other: No free fluid. Subcutaneous edema over the lateral soft tissues of the right hip and right upper leg. Musculoskeletal: Known subacute healing right inferior pubic ramus fracture. Known right femoral intertrochanteric fracture post fixation with intramedullary nail and associated screw bridging the femoral neck into the femoral head intact. Near anatomic alignment over the fracture site. Stable displaced right lesser trochanter fragment. Stable  subacute right sacral alar fracture. No new fractures identified. Remainder the exam is unchanged. IMPRESSION: 1. Stable right femoral trochanteric fracture post fixation with hardware intact and near anatomic alignment over the fracture site. 2.  Stable known subacute right inferior pubic ramus fracture. 3.  Stable subacute right sacral alar fracture. 4. 4.4 cm infrarenal abdominal aortic aneurysm. Recommend followup by ultrasound in 1 year. This recommendation follows ACR consensus guidelines: White Paper of the ACR Incidental Findings Committee II on Vascular Findings. J Am Coll Radiol 2013; 10:789-794. Aortic aneurysm NOS (ICD10-I71.9). Aortic aneurysm NOS (ICD10-I71.9). Aortic Atherosclerosis (ICD10-I70.0). Electronically Signed   By: Marin Olp M.D.   On: 11/24/2019 11:03   MR BRAIN WO CONTRAST  Result Date: 11/19/2019 CLINICAL DATA:  Not alert for 3 days. EXAM: MRI HEAD WITHOUT CONTRAST TECHNIQUE: Multiplanar, multiecho pulse sequences of the brain and surrounding structures were obtained without intravenous contrast. COMPARISON:  11/18/2019 head CT.  09/11/2019 MRI head. FINDINGS: Brain: Moderate cerebral atrophy with ex vacuo dilatation. Advanced chronic microvascular ischemic changes predominantly involving the periventricular and subcortical white matter. Sequela of remote lacunar infarcts involving the corona radiata, basal ganglia, thalami and cerebellum. There are foci of restricted diffusion with correlative T2/FLAIR  hyperintense signal involving the right cerebellum. Scattered punctate foci of restricted diffusion are seen within the medial left parietal lobe (3:31, 32). No intracranial hemorrhage. No midline shift or extra-axial fluid collection. No mass lesion. Vascular: The major arterial flow voids are grossly unremarkable. Skull and upper cervical spine: Normal marrow signal. Sinuses/Orbits: Bilateral optic nerve sheath distension (6:11), unchanged since 2019. Bilateral lens replacement.  Mild ethmoid sinus mucosal thickening. No mastoid effusion. Other: None. IMPRESSION: Multifocal acute right cerebellar infarcts. Punctate acute medial left parietal lobe infarct. Advanced chronic microvascular ischemic changes and sequela of remote infarcts involving the corona radiata, basal ganglia, thalami and cerebellum. Moderate cerebral atrophy. Unchanged bilateral papilledema. These results were called by telephone at the time of interpretation on 11/19/2019 at 4:03 pm to provider [facility] , who verbally acknowledged these results. Electronically Signed   By: Primitivo Gauze M.D.   On: 11/19/2019 16:09   Pelvis Portable  Result Date: 11/16/2019 CLINICAL DATA:  Right hip surgery. EXAM: PORTABLE PELVIS 1-2 VIEWS COMPARISON:  Yesterday. FINDINGS: Interval intramedullary rod and screw fixation of the previously demonstrated comminuted right intertrochanteric fracture. Essentially anatomic position and alignment of the major fragments. The lesser trochanter fragment remains medially displaced. IMPRESSION: Hardware fixation of the previously demonstrated comminuted right intertrochanteric fracture. Electronically Signed   By: Claudie Revering M.D.   On: 11/16/2019 14:07   DG Chest Port 1 View  Result Date: 11/21/2019 CLINICAL DATA:  Dyspnea EXAM: PORTABLE CHEST 1 VIEW COMPARISON:  11/19/2019 FINDINGS: Cardiac shadow is stable. Postsurgical changes and loop recorder are again seen and stable. The lungs are well aerated with minimal left basilar atelectasis. No new focal infiltrate is seen. IMPRESSION: Minimal left basilar atelectasis. Electronically Signed   By: Inez Catalina M.D.   On: 11/21/2019 08:27   DG CHEST PORT 1 VIEW  Result Date: 11/19/2019 CLINICAL DATA:  Hypoxia, post hip surgery EXAM: PORTABLE CHEST 1 VIEW COMPARISON:  Portable exam 0835 hours compared to 09/11/2019 FINDINGS: Loop recorder projects over LEFT heart. Borderline enlargement of cardiac silhouette post CABG. Mediastinal contours  and pulmonary vascularity normal. Mild bibasilar atelectasis. Remaining lungs clear. No pleural effusion or pneumothorax. Bones demineralized. IMPRESSION: Mild bibasilar atelectasis. Electronically Signed   By: Lavonia Dana M.D.   On: 11/19/2019 10:19   DG Swallowing Func-Speech Pathology  Result Date: 11/29/2019 Objective Swallowing Evaluation: Type of Study: MBS-Modified Barium Swallow Study  Patient Details Name: [name] MRN: [id number] Date of Birth: [birth year] Today's Date: 11/29/2019 Time: SLP Start Time (ACUTE ONLY): 1226 -SLP Stop Time (ACUTE ONLY): 1240 SLP Time Calculation (min) (ACUTE ONLY): 14 min Past Medical History: Past Medical History: Diagnosis Date . AAA (abdominal aortic aneurysm) ([place])  . Aneurysm of iliac artery ([facility])  . CAD (coronary artery disease)   a. s/p CABGx4 in 07/2015. . Carotid artery disease ([place])  . Colon polyps  . Diabetes ([place])  . Difficult intubation  . Duodenal ulcer disease 08/15/2018 . Esophageal reflux  . High cholesterol  . History of IBS 02/27/2009 . Hypertension   pt denies, he says he has a h/o hypotension. If BP up he adjusts the Florinef . Jejunitis with partial SBO 05/20/2017 . Orthostatic hypotension  . Prostatitis  . Stroke ([place])  . TIA (transient ischemic attack)  . Upper GI bleed 06/24/2018 . Vitamin B 12 deficiency  . Vitamin D deficiency  Past Surgical History: Past Surgical History: Procedure Laterality Date . BIOPSY  06/25/2018  Procedure: BIOPSY;  Surgeon: Rush Landmark Telford Nab., MD;  Location: [facility]  ENDOSCOPY;  Service: Gastroenterology;; . BIOPSY  04/24/2019  Procedure: BIOPSY;  Surgeon: Irving Copas., MD;  Location: [facility] ENDOSCOPY;  Service: Gastroenterology;; . BLADDER SURGERY  1969  traumatic pelvic fractures, urethral and bladder repair . CARDIAC CATHETERIZATION N/A 07/01/2015  Procedure: Left Heart Cath and Coronary Angiography;  Surgeon: Wellington Hampshire, MD;  Location: [place] CV LAB;  Service: Cardiovascular;  Laterality: N/A; . COLON  RESECTION N/A 05/17/2017  Procedure: DIAGNOSTIC LAPAROSCOPY,;  Surgeon: Leighton Ruff, MD;  Location: [facility] ORS;  Service: General;  Laterality: N/A; . COLONOSCOPY WITH PROPOFOL N/A 04/24/2019  Procedure: COLONOSCOPY WITH PROPOFOL;  Surgeon: Irving Copas., MD;  Location: [facility] ENDOSCOPY;  Service: Gastroenterology;  Laterality: N/A; . CORONARY ANGIOPLASTY WITH STENT PLACEMENT   . CORONARY ARTERY BYPASS GRAFT N/A 07/06/2015  Procedure: CORONARY ARTERY BYPASS GRAFTING (CABG)x 4   utilizing the left internal mammary artery and endoscopically harvested bilateral  sapheneous vein.;  Surgeon: Ivin Poot, MD;  Location: [place];  Service: Open Heart Surgery;  Laterality: N/A; . ESOPHAGEAL DILATION  04/24/2019  Procedure: ESOPHAGEAL DILATION;  Surgeon: Rush Landmark Telford Nab., MD;  Location: [facility] ENDOSCOPY;  Service: Gastroenterology;; . ESOPHAGOGASTRODUODENOSCOPY (EGD) WITH PROPOFOL N/A 06/25/2018  Procedure: ESOPHAGOGASTRODUODENOSCOPY (EGD) WITH PROPOFOL;  Surgeon: Irving Copas., MD;  Location: [place];  Service: Gastroenterology;  Laterality: N/A; . ESOPHAGOGASTRODUODENOSCOPY (EGD) WITH PROPOFOL N/A 04/24/2019  Procedure: ESOPHAGOGASTRODUODENOSCOPY (EGD) WITH PROPOFOL;  Surgeon: Rush Landmark Telford Nab., MD;  Location: [facility] ENDOSCOPY;  Service: Gastroenterology;  Laterality: N/A; . FEMUR IM NAIL Right 11/16/2019  Procedure: RIGHT HIP INTRAMEDULLARY (IM) NAIL FEMORAL;  Surgeon: Meredith Pel, MD;  Location: [place];  Service: Orthopedics;  Laterality: Right; . HEMOSTASIS CLIP PLACEMENT  04/24/2019  Procedure: HEMOSTASIS CLIP PLACEMENT;  Surgeon: Irving Copas., MD;  Location: [facility] ENDOSCOPY;  Service: Gastroenterology;; . KNEE SURGERY   . LOOP RECORDER INSERTION N/A 02/19/2018  Procedure: LOOP RECORDER INSERTION;  Surgeon: Deboraha Sprang, MD;  Location: [place] CV LAB;  Service: Cardiovascular;  Laterality: N/A; . POLYPECTOMY  04/24/2019  Procedure: POLYPECTOMY;  Surgeon: Rush Landmark Telford Nab., MD;  Location: [facility];  Service: Gastroenterology;; . Lia Foyer LIFTING INJECTION  04/24/2019  Procedure: SUBMUCOSAL LIFTING INJECTION;  Surgeon: Irving Copas., MD;  Location: [facility] ENDOSCOPY;  Service: Gastroenterology;; . TEE WITHOUT CARDIOVERSION N/A 07/06/2015  Procedure: TRANSESOPHAGEAL ECHOCARDIOGRAM (TEE);  Surgeon: Ivin Poot, MD;  Location: [place];  Service: Open Heart Surgery;  Laterality: N/A; HPI:  86 y.o. male with a history of diet-controlled type 2 diabetes with stage III chronic kidney disease and diabetic neuropathy, coronary artery disease status post bypass, CVA.  Patient presents with right hip fx after a mechanical fall.  Underwent right hip IM nail placement 7/17. Now with acute metabolic encephalopathy.  [name] present, she reports hx of increasing swallowing difficulty in the last several months, with coughing/choking during meals. MRI brain 7/20: Multifocal acute right cerebellar infarcts. Punctate acute medial left parietal lobe infarct. MBS 7./2: Moderate oropharyngeal dysphagia, also needing Mod cues for arousal throughout session. His labial seal is weak around a cup, as liquids spill down his chin. He has improved seal with a straw and spoon but still has slow lingual manipulation, taking multiple, pumping efforts to push boluses posteriorly. Bolus cohesion is poor especially with liquids of all consistencies. Swallow initiation is triggered at the pyriform sinuses with thin and nectar thick liquids.  Subjective: lethargic Assessment / Plan / Recommendation [facility] IP CLINICAL IMPRESSIONS 11/29/2019 Clinical Impression Pt presents with oropharyngeal dysphagia  characterized by impaired bolus cohesion, an oral transit delay, reduced anterior laryngeal moevement, and a pharyngeal delay. He demonstrated lingual rocking, premature spilage to the valleculae and pyriform sinuses with the swallow often triggered with the head of the bolus at the pyriform sinuses, and silent  aspiration (PAS 8) with thin liquids via cup and with nectar thick liquids via straw secondary to premature spillage and the pharyngeal delay. Transient penetration (PAS 2) was noted with nectar thick liquids via cup. Depth of laryngeal invasion with liquids was improved with a chin tuck posture but pt was unable to consistently demonstrate this even with verbal and tactile cueing. A dysphagia 3 diet with nectar thick liquids is recommended with strict observance of swallowing precautions including, but not limited to, avoidance of straws. SLP will continue to follow pt for dysphagia treatment.  SLP Visit Diagnosis Dysphagia, oropharyngeal phase (R13.12) Attention and concentration deficit following -- Frontal lobe and executive function deficit following -- Impact on safety and function Moderate aspiration risk   [facility] IP TREATMENT RECOMMENDATION 11/29/2019 Treatment Recommendations Therapy as outlined in treatment plan below   Prognosis 11/29/2019 Prognosis for Safe Diet Advancement Good Barriers to Reach Goals Cognitive deficits Barriers/Prognosis Comment -- [facility]L IP DIET RECOMMENDATION 11/29/2019 SLP Diet Recommendations Dysphagia 3 (Mech soft) solids;Nectar thick liquid Liquid Administration via Cup;No straw Medication Administration Whole meds with puree Compensations Slow rate;Small sips/bites Postural Changes Seated upright at 90 degrees   CHL IP OTHER RECOMMENDATIONS 11/29/2019 Recommended Consults -- Oral Care Recommendations Oral care BID Other Recommendations Remove water pitcher;Prohibited food (jello, ice cream, thin soups);Order thickener from pharmacy   [facility] IP FOLLOW UP RECOMMENDATIONS 11/29/2019 Follow up Recommendations Skilled Nursing facility   [facility] IP FREQUENCY AND DURATION 11/29/2019 Speech Therapy Frequency (ACUTE ONLY) min 2x/week Treatment Duration 2 weeks      [facility] IP ORAL PHASE 11/29/2019 Oral Phase Impaired Oral - Pudding Teaspoon -- Oral - Pudding Cup -- Oral - Honey Teaspoon -- Oral - Honey Cup  Decreased bolus cohesion;Premature spillage Oral - Nectar Teaspoon -- Oral - Nectar Cup Decreased bolus cohesion;Premature spillage Oral - Nectar Straw Decreased bolus cohesion;Premature spillage Oral - Thin Teaspoon -- Oral - Thin Cup Decreased bolus cohesion;Premature spillage Oral - Thin Straw Decreased bolus cohesion;Premature spillage Oral - Puree Decreased bolus cohesion;Premature spillage;Delayed oral transit Oral - Mech Soft Decreased bolus cohesion Oral - Regular -- Oral - Multi-Consistency -- Oral - Pill -- Oral Phase - Comment --  [facility]L IP PHARYNGEAL PHASE 11/29/2019 Pharyngeal Phase Impaired Pharyngeal- Pudding Teaspoon -- Pharyngeal -- Pharyngeal- Pudding Cup -- Pharyngeal -- Pharyngeal- Honey Teaspoon -- Pharyngeal -- Pharyngeal- Honey Cup Delayed swallow initiation-vallecula;Delayed swallow initiation-pyriform sinuses;Reduced anterior laryngeal mobility;Penetration/Aspiration during swallow Pharyngeal Material enters airway, remains ABOVE vocal cords then ejected out Pharyngeal- Nectar Teaspoon -- Pharyngeal -- Pharyngeal- Nectar Cup Delayed swallow initiation-vallecula;Delayed swallow initiation-pyriform sinuses;Reduced anterior laryngeal mobility;Penetration/Aspiration during swallow Pharyngeal Material enters airway, remains ABOVE vocal cords then ejected out Pharyngeal- Nectar Straw Delayed swallow initiation-vallecula;Delayed swallow initiation-pyriform sinuses;Reduced anterior laryngeal mobility;Penetration/Aspiration during swallow Pharyngeal Material enters airway, passes BELOW cords without attempt by patient to eject out (silent aspiration) Pharyngeal- Thin Teaspoon -- Pharyngeal -- Pharyngeal- Thin Cup Delayed swallow initiation-vallecula;Delayed swallow initiation-pyriform sinuses;Reduced anterior laryngeal mobility;Penetration/Aspiration during swallow Pharyngeal Material enters airway, passes BELOW cords without attempt by patient to eject out (silent aspiration) Pharyngeal- Thin Straw NT  Pharyngeal -- Pharyngeal- Puree Delayed swallow initiation-vallecula;Reduced anterior laryngeal mobility Pharyngeal -- Pharyngeal- Mechanical Soft NT Pharyngeal -- Pharyngeal- Regular Reduced anterior laryngeal mobility Pharyngeal -- Pharyngeal-  Multi-consistency -- Pharyngeal -- Pharyngeal- Pill -- Pharyngeal -- Pharyngeal Comment --  CHL IP CERVICAL ESOPHAGEAL PHASE 11/29/2019 Cervical Esophageal Phase WFL Pudding Teaspoon -- Pudding Cup -- Honey Teaspoon -- Honey Cup -- Nectar Teaspoon -- Nectar Cup -- Nectar Straw -- Thin Teaspoon -- Thin Cup -- Thin Straw -- Puree -- Mechanical Soft -- Regular -- Multi-consistency -- Pill -- Cervical Esophageal Comment -- Shanika [name], [place], [place] Office number [phone] Pager [phone] Horton Marshall 11/29/2019, 1:24 PM              DG Swallowing Func-Speech Pathology  Result Date: 11/19/2019 Objective Swallowing Evaluation: Type of Study: MBS-Modified Barium Swallow Study  Patient Details Name: [name] MRN: [id number] Date of Birth: Nov 22, 1933 Today's Date: 11/19/2019 Time: SLP Start Time (ACUTE ONLY): 1660 -SLP Stop Time (ACUTE ONLY): 1016 SLP Time Calculation (min) (ACUTE ONLY): 18 min Past Medical History: Past Medical History: Diagnosis Date . Aneurysm of iliac artery (HCC)  . Colon polyps  . Coronary atherosclerosis of unspecified type of vessel, native or graft  . Diabetes ([place])  . Difficult intubation  . Duodenal ulcer disease 08/15/2018 . Esophageal reflux  . High cholesterol  . History of IBS 02/27/2009 . Hypertension   pt denies, he says he has a h/o hypotension. If BP up he adjusts the [place] . Jejunitis with partial SBO 05/20/2017 . Orthostatic hypotension   "BP has been dropping alot when I stand up for the last month or so" (02/17/2016) . Prostatitis  . Upper GI bleed 06/24/2018 . Vitamin B 12 deficiency  . Vitamin D deficiency  Past Surgical History: Past Surgical History: Procedure Laterality Date . BIOPSY   06/25/2018  Procedure: BIOPSY;  Surgeon: Rush Landmark Telford Nab., MD;  Location: [place];  Service: Gastroenterology;; . BIOPSY  04/24/2019  Procedure: BIOPSY;  Surgeon: Irving Copas., MD;  Location: [facility];  Service: Gastroenterology;; . BLADDER SURGERY  1969  traumatic pelvic fractures, urethral and bladder repair . CARDIAC CATHETERIZATION N/A 07/01/2015  Procedure: Left Heart Cath and Coronary Angiography;  Surgeon: Wellington Hampshire, MD;  Location: [facility];  Service: Cardiovascular;  Laterality: N/A; . COLON RESECTION N/A 05/17/2017  Procedure: DIAGNOSTIC LAPAROSCOPY,;  Surgeon: Leighton Ruff, MD;  Location: [facility];  Service: General;  Laterality: N/A; . COLONOSCOPY WITH PROPOFOL N/A 04/24/2019  Procedure: COLONOSCOPY WITH PROPOFOL;  Surgeon: Irving Copas., MD;  Location: [facility];  Service: Gastroenterology;  Laterality: N/A; . CORONARY ANGIOPLASTY WITH STENT PLACEMENT   . CORONARY ARTERY BYPASS GRAFT N/A 07/06/2015  Procedure: CORONARY ARTERY BYPASS GRAFTING (CABG)x 4   utilizing the left internal mammary artery and endoscopically harvested bilateral  sapheneous vein.;  Surgeon: Ivin Poot, MD;  Location: [place];  Service: Open Heart Surgery;  Laterality: N/A; . ESOPHAGEAL DILATION  04/24/2019  Procedure: ESOPHAGEAL DILATION;  Surgeon: Rush Landmark Telford Nab., MD;  Location: [facility];  Service: Gastroenterology;; . ESOPHAGOGASTRODUODENOSCOPY (EGD) WITH PROPOFOL N/A 06/25/2018  Procedure: ESOPHAGOGASTRODUODENOSCOPY (EGD) WITH PROPOFOL;  Surgeon: Irving Copas., MD;  Location: [place];  Service: Gastroenterology;  Laterality: N/A; . ESOPHAGOGASTRODUODENOSCOPY (EGD) WITH PROPOFOL N/A 04/24/2019  Procedure: ESOPHAGOGASTRODUODENOSCOPY (EGD) WITH PROPOFOL;  Surgeon: Rush Landmark Telford Nab., MD;  Location: [facility];  Service: Gastroenterology;  Laterality: N/A; . FEMUR IM NAIL Right 11/16/2019  Procedure: RIGHT HIP INTRAMEDULLARY (IM) NAIL FEMORAL;   Surgeon: Meredith Pel, MD;  Location: [place];  Service: Orthopedics;  Laterality: Right; . HEMOSTASIS CLIP PLACEMENT  04/24/2019  Procedure: HEMOSTASIS CLIP  PLACEMENT;  Surgeon: Rush Landmark Telford Nab., MD;  Location: [facility];  Service: Gastroenterology;; . KNEE SURGERY   . LOOP RECORDER INSERTION N/A 02/19/2018  Procedure: LOOP RECORDER INSERTION;  Surgeon: Deboraha Sprang, MD;  Location: [place] [facility];  Service: Cardiovascular;  Laterality: N/A; . POLYPECTOMY  04/24/2019  Procedure: POLYPECTOMY;  Surgeon: Rush Landmark Telford Nab., MD;  Location: [facility];  Service: Gastroenterology;; . Lia Foyer LIFTING INJECTION  04/24/2019  Procedure: SUBMUCOSAL LIFTING INJECTION;  Surgeon: Irving Copas., MD;  Location: [facility];  Service: Gastroenterology;; . TEE WITHOUT CARDIOVERSION N/A 07/06/2015  Procedure: TRANSESOPHAGEAL ECHOCARDIOGRAM (TEE);  Surgeon: Ivin Poot, MD;  Location: [place];  Service: Open Heart Surgery;  Laterality: N/A; HPI:  86 y.o. male with a history of diet-controlled type 2 diabetes with stage III chronic kidney disease and diabetic neuropathy, coronary artery disease status post bypass, CVA.  Patient presents with right hip fx after a mechanical fall.  Underwent right hip IM nail placement 7/17. Now with acute metabolic encephalopathy.  [name] present, she reports hx of increasing swallowing difficulty in the last several months, with coughing/choking during meals.  Subjective: lethargic Assessment / Plan / Recommendation CHL IP CLINICAL IMPRESSIONS 11/19/2019 Clinical Impression Pt has a moderate oropharyngeal dysphagia, also needing Mod cues for arousal throughout session. His labial seal is weak around a cup, as liquids spill down his chin. He has improved seal with a straw and spoon but still has slow lingual manipulation, taking multiple, pumping efforts to push boluses posteriorly. He takes rest breaks while masticating solids. Bolus cohesion is poor  especially with liquids of all consistencies. Swallow initiation is triggered at the pyriform sinuses with thin and nectar thick liquids with silent entrance into the airway before the swallow. Anterior hyoid movement, laryngeal elevation, and laryngeal vestibule closure are also all reduced. Airway protection is not improved with changing bolus delivery or attempts at a chin tuck, but his lethargy also limits his ability to follow postural strategies. SLP provided tactile assist to manipulate a chin down position. Honey thick liquids via straw enter the airway as well but not when administered via spoon. Recommend Dys 3 (mechanical soft) diet and honey thick liquids by cup.  SLP Visit Diagnosis Dysphagia, oropharyngeal phase (R13.12) Attention and concentration deficit following -- Frontal lobe and executive function deficit following -- Impact on safety and function Moderate aspiration risk   CHL IP TREATMENT RECOMMENDATION 11/19/2019 Treatment Recommendations Therapy as outlined in treatment plan below   Prognosis 11/19/2019 Prognosis for Safe Diet Advancement Good Barriers to Reach Goals Cognitive deficits Barriers/Prognosis Comment -- [facility]L IP DIET RECOMMENDATION 11/19/2019 SLP Diet Recommendations Dysphagia 3 (Mech soft) solids;Honey thick liquids Liquid Administration via Cup;No straw Medication Administration Whole meds with puree Compensations Slow rate;Small sips/bites Postural Changes Seated upright at 90 degrees   CHL IP OTHER RECOMMENDATIONS 11/19/2019 Recommended Consults -- Oral Care Recommendations Oral care BID Other Recommendations Remove water pitcher;Prohibited food (jello, ice cream, thin soups);Order thickener from pharmacy   [facility] IP FOLLOW UP RECOMMENDATIONS 11/19/2019 Follow up Recommendations Skilled Nursing facility   [facility] IP FREQUENCY AND DURATION 11/19/2019 Speech Therapy Frequency (ACUTE ONLY) min 2x/week Treatment Duration 2 weeks      [facility] IP ORAL PHASE 11/19/2019 Oral Phase Impaired Oral -  Pudding Teaspoon -- Oral - Pudding Cup -- Oral - Honey Teaspoon -- Oral - Honey Cup Left anterior bolus loss;Right anterior bolus loss;Weak lingual manipulation;Lingual pumping;Reduced posterior propulsion;Lingual/palatal residue;Delayed oral transit;Decreased bolus cohesion Oral - Nectar Teaspoon -- Oral -  Nectar Cup Left anterior bolus loss;Right anterior bolus loss;Weak lingual manipulation;Lingual pumping;Reduced posterior propulsion;Lingual/palatal residue;Delayed oral transit;Decreased bolus cohesion;Premature spillage Oral - Nectar Straw Left anterior bolus loss;Right anterior bolus loss;Weak lingual manipulation;Lingual pumping;Reduced posterior propulsion;Lingual/palatal residue;Delayed oral transit;Decreased bolus cohesion;Premature spillage Oral - Thin Teaspoon -- Oral - Thin Cup Left anterior bolus loss;Right anterior bolus loss;Weak lingual manipulation;Lingual pumping;Reduced posterior propulsion;Lingual/palatal residue;Delayed oral transit;Decreased bolus cohesion;Premature spillage Oral - Thin Straw Left anterior bolus loss;Right anterior bolus loss;Weak lingual manipulation;Lingual pumping;Reduced posterior propulsion;Lingual/palatal residue;Delayed oral transit;Decreased bolus cohesion;Premature spillage Oral - Puree Weak lingual manipulation;Lingual pumping;Reduced posterior propulsion;Lingual/palatal residue;Delayed oral transit;Decreased bolus cohesion Oral - Mech Soft Weak lingual manipulation;Lingual pumping;Reduced posterior propulsion;Lingual/palatal residue;Delayed oral transit;Decreased bolus cohesion;Impaired mastication Oral - Regular -- Oral - Multi-Consistency -- Oral - Pill -- Oral Phase - Comment --  CHL IP PHARYNGEAL PHASE 11/19/2019 Pharyngeal Phase Impaired Pharyngeal- Pudding Teaspoon -- Pharyngeal -- Pharyngeal- Pudding Cup -- Pharyngeal -- Pharyngeal- Honey Teaspoon -- Pharyngeal -- Pharyngeal- Honey Cup Reduced anterior laryngeal mobility;Reduced laryngeal elevation;Reduced  airway/laryngeal closure;Delayed swallow initiation-vallecula Pharyngeal -- Pharyngeal- Nectar Teaspoon -- Pharyngeal -- Pharyngeal- Nectar Cup Reduced anterior laryngeal mobility;Reduced laryngeal elevation;Reduced airway/laryngeal closure;Delayed swallow initiation-pyriform sinuses;Penetration/Aspiration before swallow Pharyngeal Material enters airway, passes BELOW cords without attempt by patient to eject out (silent aspiration) Pharyngeal- Nectar Straw Reduced anterior laryngeal mobility;Reduced laryngeal elevation;Reduced airway/laryngeal closure;Delayed swallow initiation-pyriform sinuses;Penetration/Aspiration before swallow Pharyngeal Material enters airway, passes BELOW cords without attempt by patient to eject out (silent aspiration) Pharyngeal- Thin Teaspoon -- Pharyngeal -- Pharyngeal- Thin Cup Reduced anterior laryngeal mobility;Reduced laryngeal elevation;Reduced airway/laryngeal closure;Penetration/Aspiration before swallow;Delayed swallow initiation-pyriform sinuses Pharyngeal Material enters airway, passes BELOW cords without attempt by patient to eject out (silent aspiration) Pharyngeal- Thin Straw Reduced anterior laryngeal mobility;Reduced laryngeal elevation;Reduced airway/laryngeal closure;Delayed swallow initiation-pyriform sinuses;Penetration/Aspiration before swallow Pharyngeal Material enters airway, passes BELOW cords without attempt by patient to eject out (silent aspiration) Pharyngeal- Puree Reduced anterior laryngeal mobility;Reduced laryngeal elevation;Reduced airway/laryngeal closure;Delayed swallow initiation-vallecula Pharyngeal -- Pharyngeal- Mechanical Soft Reduced anterior laryngeal mobility;Reduced laryngeal elevation;Reduced airway/laryngeal closure;Delayed swallow initiation-vallecula Pharyngeal -- Pharyngeal- Regular -- Pharyngeal -- Pharyngeal- Multi-consistency -- Pharyngeal -- Pharyngeal- Pill -- Pharyngeal -- Pharyngeal Comment --  No flowsheet data found. Osie Bond.,  M.A. CCC-SLP Acute Rehabilitation Services Pager [phone] Office [phone] 11/19/2019, 11:53 AM              DG C-Arm 1-60 Min  Result Date: 11/16/2019 CLINICAL DATA:  Right hip fracture. EXAM: DG C-ARM 1-60 MIN; DG HIP (WITH OR WITHOUT PELVIS) 2-3V RIGHT COMPARISON:  11/15/2019 FINDINGS: Eighteen C-arm views of the right hip and femur demonstrate intramedullary rod and compression screw fixation of the previously demonstrated comminuted right intertrochanteric fracture. Anatomic position and alignment on the later images. IMPRESSION: Hardware fixation of the previously demonstrated right intertrochanteric fracture. Electronically Signed   By: Claudie Revering M.D.   On: 11/16/2019 14:06   VAS [place] TRANSCRANIAL DOPPLER W BUBBLES  Result Date: 11/22/2019  Transcranial Doppler with Bubble Indications: Stroke. Limitations: Altered mental status Limitations for diagnostic windows: Unable to insonate right transtemporal window. Unable to insonate left transtemporal window. Comparison Study: No prior TCD bubble study. Prior TCD done 10/22/19 Performing Technologist: Sharion Dove RVS  Examination Guidelines: A complete evaluation includes B-mode imaging, spectral Doppler, color Doppler, and power Doppler as needed of all accessible portions of each vessel. Bilateral testing is considered an integral part of a complete examination. Limited examinations for reoccurring indications may be performed as noted.  Summary: No HITS at rest or during Valsalva. Negative transcranial  Doppler Bubble study with no evidence of right to left intracardiac communication.  A vascular evaluation was performed. The right Orbital was studied. An IV was inserted into the patient's right Right hand. Verbal informed consent was obtained.  Negative TCD Bubble study *See table(s) above for TCD measurements and observations.  Diagnosing physician: Antony Contras MD Electronically signed by Antony Contras MD on 11/22/2019 at 2:22:57 PM.    Final     ECHOCARDIOGRAM COMPLETE  Result Date: 11/19/2019    ECHOCARDIOGRAM REPORT   Patient Name:   [name] Date of Exam: 11/19/2019 Medical Rec #:  [id number]         Height:       72.0 in Accession #:    [id number]        Weight:       180.0 lb Date of Birth:  1933/12/08         BSA:          2.037 m Patient Age:    48 years          BP:           134/63 mmHg Patient Gender: M                 HR:           90 bpm. Exam Location:  Inpatient Procedure: 2D Echo, Cardiac Doppler and Color Doppler Indications:    Elevated Troponin  History:        Patient has prior history of Echocardiogram examinations, most                 recent 02/01/2018. Risk Factors:Hypertension, Diabetes,                 Dyslipidemia and GERD.  Sonographer:    Jonelle Sidle Dance Referring Phys: [id number] [place]  1. Left ventricular ejection fraction, by estimation, is 60 to 65%. The left ventricle has normal function. The left ventricle has no regional wall motion abnormalities. There is moderate concentric left ventricular hypertrophy. Left ventricular diastolic parameters are consistent with Grade II diastolic dysfunction (pseudonormalization). Elevated left atrial pressure.  2. Right ventricular systolic function is normal. The right ventricular size is normal. There is normal pulmonary artery systolic pressure. The estimated right ventricular systolic pressure is 29.5 mmHg.  3. Left atrial size was mildly dilated.  4. The mitral valve is degenerative. No evidence of mitral valve regurgitation. No evidence of mitral stenosis.  5. The aortic valve is tricuspid. Aortic valve regurgitation is not visualized. No aortic stenosis is present.  6. The inferior vena cava is normal in size with greater than 50% respiratory variability, suggesting right atrial pressure of 3 mmHg. Comparison(s): No significant change from prior study. FINDINGS  Left Ventricle: Left ventricular ejection fraction, by estimation, is 60 to 65%. The left  ventricle has normal function. The left ventricle has no regional wall motion abnormalities. The left ventricular internal cavity size was normal in size. There is  moderate concentric left ventricular hypertrophy. Left ventricular diastolic parameters are consistent with Grade II diastolic dysfunction (pseudonormalization). Elevated left atrial pressure. Right Ventricle: The right ventricular size is normal. No increase in right ventricular wall thickness. Right ventricular systolic function is normal. There is normal pulmonary artery systolic pressure. The tricuspid regurgitant velocity is 2.50 m/s, and  with an assumed right atrial pressure of 3 mmHg, the estimated right ventricular systolic pressure is 28.4 mmHg. Left Atrium: Left atrial size was mildly dilated.  Right Atrium: Right atrial size was normal in size. Pericardium: Trivial pericardial effusion is present. Presence of pericardial fat pad. Mitral Valve: The mitral valve is degenerative in appearance. There is mild calcification of the posterior and anterior mitral valve leaflet(s). Mild mitral annular calcification. No evidence of mitral valve regurgitation. No evidence of mitral valve stenosis. Tricuspid Valve: The tricuspid valve is grossly normal. Tricuspid valve regurgitation is mild . No evidence of tricuspid stenosis. Aortic Valve: The aortic valve is tricuspid. Aortic valve regurgitation is not visualized. No aortic stenosis is present. Pulmonic Valve: The pulmonic valve was grossly normal. Pulmonic valve regurgitation is mild. No evidence of pulmonic stenosis. Aorta: The aortic root and ascending aorta are structurally normal, with no evidence of dilitation. Venous: The inferior vena cava is normal in size with greater than 50% respiratory variability, suggesting right atrial pressure of 3 mmHg. IAS/Shunts: The atrial septum is grossly normal.  LEFT VENTRICLE PLAX 2D LVIDd:         4.50 cm  Diastology LVIDs:         3.70 cm  LV e' lateral:    7.29 cm/s LV PW:         1.70 cm  LV E/e' lateral: 13.6 LV IVS:        1.50 cm  LV e' medial:    4.68 cm/s LVOT diam:     2.00 cm  LV E/e' medial:  21.1 LV SV:         72 LV SV Index:   35 LVOT Area:     3.14 cm  RIGHT VENTRICLE             IVC RV Basal diam:  2.70 cm     IVC diam: 1.70 cm RV S prime:     10.10 cm/s TAPSE (M-mode): 1.5 cm LEFT ATRIUM             Index       RIGHT ATRIUM           Index LA diam:        4.70 cm 2.31 cm/m  RA Area:     16.40 cm LA Vol (A2C):   68.1 ml 33.43 ml/m RA Volume:   37.90 ml  18.60 ml/m LA Vol (A4C):   79.3 ml 38.92 ml/m LA Biplane Vol: 76.7 ml 37.65 ml/m  AORTIC VALVE LVOT Vmax:   120.00 cm/s LVOT Vmean:  73.350 cm/s LVOT VTI:    0.228 m  AORTA Ao Root diam: 3.70 cm Ao Asc diam:  3.30 cm MITRAL VALVE               TRICUSPID VALVE MV Area (PHT): 3.42 cm    TR Peak grad:   25.0 mmHg MV Decel Time: 222 msec    TR Vmax:        250.00 cm/s MV E velocity: 98.80 cm/s MV A velocity: 96.70 cm/s  SHUNTS MV E/A ratio:  1.02        Systemic VTI:  0.23 m                            Systemic Diam: 2.00 cm Eleonore Chiquito MD Electronically signed by Eleonore Chiquito MD Signature Date/Time: 11/19/2019/3:38:06 PM    Final    CUP PACEART REMOTE DEVICE CHECK  Result Date: 11/18/2019 [facility] summary report received. Battery status OK. Normal device function. No new symptom episodes, tachy episodes, brady, or pause episodes. No new  AF episodes. Monthly summary reports and ROV/PRN  DG HIP UNILAT WITH PELVIS 2-3 VIEWS RIGHT  Result Date: 11/16/2019 CLINICAL DATA:  Right hip fracture. EXAM: DG C-ARM 1-60 MIN; DG HIP (WITH OR WITHOUT PELVIS) 2-3V RIGHT COMPARISON:  11/15/2019 FINDINGS: Eighteen C-arm views of the right hip and femur demonstrate intramedullary rod and compression screw fixation of the previously demonstrated comminuted right intertrochanteric fracture. Anatomic position and alignment on the later images. IMPRESSION: Hardware fixation of the previously demonstrated right  intertrochanteric fracture. Electronically Signed   By: Claudie Revering M.D.   On: 11/16/2019 14:06   DG Hip Unilat With Pelvis 2-3 Views Right  Result Date: 11/15/2019 CLINICAL DATA:  Fall, right hip pain EXAM: DG HIP (WITH OR WITHOUT PELVIS) 2-3V RIGHT COMPARISON:  None. FINDINGS: Comminuted intertrochanteric right hip fracture seen with mild Verus angulation and avulsion of the lesser trochanter. Femoral head is still seated within the right acetabulum. Limited evaluation of the left hip is unremarkable. The pelvis and sacrum appear intact. IMPRESSION: Comminuted intertrochanteric right hip fracture. Electronically Signed   By: Fidela Salisbury MD   On: 11/15/2019 15:35   DG FLUORO GUIDE LUMBAR PUNCTURE  Result Date: 11/22/2019 CLINICAL DATA:  Encephalopathy. EXAM: DIAGNOSTIC LUMBAR PUNCTURE UNDER FLUOROSCOPIC GUIDANCE FLUOROSCOPY TIME:  Fluoroscopy Time:  24 seconds. Radiation Exposure Index (if provided by the fluoroscopic device): 12.8 mGy Number of Acquired Spot Images: 1 PROCEDURE: Informed consent was obtained from the patient's wife, Wong Steadham via telephone prior to the procedure. This included a discussion of potential risks of the procedure including but not limited to headache, allergy, pain. With the patient prone, the lower back was prepped with Betadine. 1% Lidocaine was used for local anesthesia. Lumbar puncture was performed at the L3-L4 level using a 20 gauge needle with return of clear CSF. 13.5 ml of CSF were obtained for laboratory studies. The patient tolerated the procedure well and there were no apparent complications. IMPRESSION: Technically successful fluoroscopically guided lumbar puncture at the L3-L4 level. No immediate postprocedure complication. 13.5 mL of CSF were obtained and sent to the laboratory for analysis. Electronically Signed   By: Kellie Simmering DO   On: 11/22/2019 16:59      Subjective: No complains  Discharge Exam: Vitals:   11/29/19 1956 11/30/19 0509   BP: (!) 155/88 (!) 154/87  Pulse: 95 92  Resp: 19 19  Temp: 99 F (37.2 C) 99.2 F (37.3 C)  SpO2: 94% 92%   Vitals:   11/29/19 1741 11/29/19 1956 11/30/19 0235 11/30/19 0509  BP: (!) 166/86 (!) 155/88  (!) 154/87  Pulse: 96 95  92  Resp:  19  19  Temp:  99 F (37.2 C)  99.2 F (37.3 C)  TempSrc:  Oral  Oral  SpO2:  94%  92%  Weight:   (!) 94.5 kg   Height:        General: Pt is alert, awake, not in acute distress Cardiovascular: RRR, S1/S2 +, no rubs, no gallops Respiratory: CTA bilaterally, no wheezing, no rhonchi Abdominal: Soft, NT, ND, bowel sounds + Extremities: no edema, no cyanosis    The results of significant diagnostics from this hospitalization (including imaging, microbiology, ancillary and laboratory) are listed below for reference.     Microbiology: Recent Results (from the past 240 hour(s))  Culture, blood (Routine X 2) w Reflex to ID Panel     Status: Abnormal   Collection Time: 11/21/19  9:09 AM   Specimen: BLOOD RIGHT HAND  Result Value  Ref Range Status   Specimen Description BLOOD RIGHT HAND  Final   Special Requests   Final    BOTTLES DRAWN AEROBIC ONLY Blood Culture adequate volume   Culture  Setup Time   Final    GRAM POSITIVE COCCI IN CLUSTERS AEROBIC BOTTLE ONLY CRITICAL RESULT CALLED TO, READ BACK BY AND VERIFIED WITH: Sarajane Marek [id number] SK  Performed at [facility], [address], [place], [place] [address]    Culture STAPHYLOCOCCUS EPIDERMIDIS (A)  Final   Report Status 11/24/2019 FINAL  Final   Organism ID, Bacteria STAPHYLOCOCCUS EPIDERMIDIS  Final      Susceptibility   Staphylococcus epidermidis - MIC*    CIPROFLOXACIN >=8 RESISTANT Resistant     ERYTHROMYCIN <=0.25 SENSITIVE Sensitive     GENTAMICIN <=0.5 SENSITIVE Sensitive     OXACILLIN <=0.25 SENSITIVE Sensitive     TETRACYCLINE <=1 SENSITIVE Sensitive     VANCOMYCIN 1 SENSITIVE Sensitive     TRIMETH/SULFA 160 RESISTANT Resistant     CLINDAMYCIN <=0.25  SENSITIVE Sensitive     RIFAMPIN <=0.5 SENSITIVE Sensitive     Inducible Clindamycin NEGATIVE Sensitive     * STAPHYLOCOCCUS EPIDERMIDIS  Blood Culture ID Panel (Reflexed)     Status: Abnormal   Collection Time: 11/21/19  9:09 AM  Result Value Ref Range Status   Enterococcus species NOT DETECTED NOT DETECTED Final   Listeria monocytogenes NOT DETECTED NOT DETECTED Final   Staphylococcus species DETECTED (A) NOT DETECTED Final    Comment: Methicillin (oxacillin) susceptible coagulase negative staphylococcus. Possible blood culture contaminant (unless isolated from more than one blood culture draw or clinical case suggests pathogenicity). No antibiotic treatment is indicated for blood  culture contaminants. CRITICAL RESULT CALLED TO, READ BACK BY AND VERIFIED WITH: Sarajane Marek [id number] AT 1034 SK     Staphylococcus aureus (BCID) NOT DETECTED NOT DETECTED Final   Methicillin resistance NOT DETECTED NOT DETECTED Final   Streptococcus species NOT DETECTED NOT DETECTED Final   Streptococcus agalactiae NOT DETECTED NOT DETECTED Final   Streptococcus pneumoniae NOT DETECTED NOT DETECTED Final   Streptococcus pyogenes NOT DETECTED NOT DETECTED Final   Acinetobacter baumannii NOT DETECTED NOT DETECTED Final   Enterobacteriaceae species NOT DETECTED NOT DETECTED Final   Enterobacter cloacae complex NOT DETECTED NOT DETECTED Final   Escherichia coli NOT DETECTED NOT DETECTED Final   Klebsiella oxytoca NOT DETECTED NOT DETECTED Final   Klebsiella pneumoniae NOT DETECTED NOT DETECTED Final   Proteus species NOT DETECTED NOT DETECTED Final   Serratia marcescens NOT DETECTED NOT DETECTED Final   Haemophilus influenzae NOT DETECTED NOT DETECTED Final   Neisseria meningitidis NOT DETECTED NOT DETECTED Final   Pseudomonas aeruginosa NOT DETECTED NOT DETECTED Final   Candida albicans NOT DETECTED NOT DETECTED Final   Candida glabrata NOT DETECTED NOT DETECTED Final   Candida krusei NOT DETECTED NOT  DETECTED Final   Candida parapsilosis NOT DETECTED NOT DETECTED Final   Candida tropicalis NOT DETECTED NOT DETECTED Final    Comment: Performed at [facility], [address] [place], [place] [address]  Culture, blood (Routine X 2) w Reflex to ID Panel     Status: Abnormal   Collection Time: 11/21/19  9:13 AM   Specimen: BLOOD LEFT HAND  Result Value Ref Range Status   Specimen Description BLOOD LEFT HAND  Final   Special Requests   Final    BOTTLES DRAWN AEROBIC ONLY Blood Culture results may not be optimal  due to an inadequate volume of blood received in culture bottles   Culture  Setup Time   Final    GRAM POSITIVE COCCI IN CLUSTERS AEROBIC BOTTLE ONLY CRITICAL VALUE NOTED.  VALUE IS CONSISTENT WITH PREVIOUSLY REPORTED AND CALLED VALUE.    Culture (A)  Final    STAPHYLOCOCCUS EPIDERMIDIS SUSCEPTIBILITIES PERFORMED ON PREVIOUS CULTURE WITHIN THE LAST 5 DAYS. Performed at [facility], [place] [address] [place], [place] [address]    Report Status 11/24/2019 FINAL  Final  Anaerobic culture     Status: None   Collection Time: 11/22/19  5:15 PM   Specimen: PATH Cytology CSF; Cerebrospinal Fluid  Result Value Ref Range Status   Specimen Description CSF  Final   Special Requests NONE  Final   Culture   Final    NO ANAEROBES ISOLATED Performed at [facility], [place] [address], [place], [place] [address]    Report Status 11/27/2019 FINAL  Final  CSF culture     Status: None   Collection Time: 11/22/19  5:15 PM   Specimen: PATH Cytology CSF; Cerebrospinal Fluid  Result Value Ref Range Status   Specimen Description CSF  Final   Special Requests NONE  Final   Gram Stain   Final    WBC PRESENT, PREDOMINANTLY MONONUCLEAR NO ORGANISMS SEEN CYTOSPIN SMEAR    Culture   Final    NO GROWTH 3 DAYS Performed at [facility], [place] [address] [place], [place] [address]    Report Status 11/26/2019 FINAL  Final  Culture, fungus without smear     Status: None  (Preliminary result)   Collection Time: 11/22/19  5:15 PM   Specimen: PATH Cytology CSF; Cerebrospinal Fluid  Result Value Ref Range Status   Specimen Description CSF  Final   Special Requests NONE  Final   Culture   Final    NO FUNGUS ISOLATED AFTER 7 DAYS Performed at [facility], [place] [address] [place], [place] [address]    Report Status PENDING  Incomplete  Culture, blood (Routine X 2) w Reflex to ID Panel     Status: None   Collection Time: 11/24/19  5:45 AM   Specimen: BLOOD  Result Value Ref Range Status   Specimen Description BLOOD RIGHT ANTECUBITAL  Final   Special Requests   Final    BOTTLES DRAWN AEROBIC AND ANAEROBIC Blood Culture adequate volume   Culture   Final    NO GROWTH 5 DAYS Performed at [facility], [place] [address] [place], [place] [address]    Report Status 11/29/2019 FINAL  Final  Culture, blood (Routine X 2) w Reflex to ID Panel     Status: None   Collection Time: 11/24/19  5:46 AM   Specimen: BLOOD LEFT HAND  Result Value Ref Range Status   Specimen Description BLOOD LEFT HAND  Final   Special Requests   Final    BOTTLES DRAWN AEROBIC AND ANAEROBIC Blood Culture adequate volume   Culture   Final    NO GROWTH 5 DAYS Performed at [facility], [place] [address] [place], [place] [address]    Report Status 11/29/2019 FINAL  Final  SARS Coronavirus 2 by RT PCR (hospital order, performed in [facility] hospital lab) Nasopharyngeal Nasopharyngeal Swab     Status: None   Collection Time: 11/27/19  5:20 PM   Specimen: Nasopharyngeal Swab  Result Value Ref Range Status   SARS Coronavirus 2 NEGATIVE  NEGATIVE Final    Comment: (NOTE) SARS-CoV-2 target nucleic acids are NOT DETECTED.  The SARS-CoV-2 RNA is generally detectable in upper and lower respiratory specimens during the acute phase of infection. The lowest concentration of SARS-CoV-2 viral copies this assay can detect is 250 copies / mL. A negative result does not preclude  SARS-CoV-2 infection and should not be used as the sole basis for treatment or other patient management decisions.  A negative result may occur with improper specimen collection / handling, submission of specimen other than nasopharyngeal swab, presence of viral mutation(s) within the areas targeted by this assay, and inadequate number of viral copies (<250 copies / mL). A negative result must be combined with clinical observations, patient history, and epidemiological information.  Fact Sheet for Patients:   StrictlyIdeas.no  Fact Sheet for Healthcare Providers: BankingDealers.co.za  This test is not yet approved or  cleared by the [place] FDA and has been authorized for detection and/or diagnosis of SARS-CoV-2 by FDA under an Emergency Use Authorization (EUA).  This EUA will remain in effect (meaning this test can be used) for the duration of the COVID-19 declaration under Section 564(b)(1) of the Act, 21 U.S.C. section 360bbb-3(b)(1), unless the authorization is terminated or revoked sooner.  Performed at [facility], [place] [address], [place], [place] [address]      Labs: BNP (last 3 results) No results for input(s): BNP in the last 8760 hours. Basic Metabolic Panel: Recent Labs  Lab 11/26/19 0423 11/27/19 0552 11/28/19 0425 11/29/19 0040 11/30/19 0354  NA 144 144 145 146* 142  K 3.2* 3.0* 3.6 3.3* 3.5  CL 110 109 111 110 108  CO2 26 27 26 27 26   GLUCOSE 151* 146* 131* 152* 148*  BUN 22 24* 29* 30* 26*  CREATININE 1.08 1.23 1.27* 1.25* 1.16  CALCIUM 8.0* 8.1* 8.3* 8.2* 7.9*  MG 2.0 2.2 2.4 2.2 2.0   Liver Function Tests: No results for input(s): AST, ALT, ALKPHOS, BILITOT, PROT, ALBUMIN in the last 168 hours. No results for input(s): LIPASE, AMYLASE in the last 168 hours. No results for input(s): AMMONIA in the last 168 hours. CBC: Recent Labs  Lab 11/26/19 0423 11/27/19 0552 11/28/19 0425  11/29/19 0040 11/30/19 0354  WBC 17.9* 18.4* 16.5* 14.5* 11.6*  HGB 9.4* 8.9* 9.1* 9.0* 8.7*  HCT 29.0* 28.1* 28.9* 29.8* 28.4*  MCV 94.8 94.9 96.0 97.4 96.6  PLT 389 387 430* 473* 429*   Cardiac Enzymes: No results for input(s): CKTOTAL, CKMB, CKMBINDEX, TROPONINI in the last 168 hours. BNP: Invalid input(s): POCBNP CBG: Recent Labs  Lab 11/29/19 1612 11/29/19 1952 11/30/19 0002 11/30/19 0506 11/30/19 1159  GLUCAP 122* 132* 123* 132* 132*   D-Dimer No results for input(s): DDIMER in the last 72 hours. Hgb A1c No results for input(s): HGBA1C in the last 72 hours. Lipid Profile No results for input(s): CHOL, HDL, LDLCALC, TRIG, CHOLHDL, LDLDIRECT in the last 72 hours. Thyroid function studies No results for input(s): TSH, T4TOTAL, T3FREE, THYROIDAB in the last 72 hours.  Invalid input(s): FREET3 Anemia work up No results for input(s): VITAMINB12, FOLATE, FERRITIN, TIBC, IRON, RETICCTPCT in the last 72 hours. Urinalysis    Component Value Date/Time   COLORURINE AMBER (A) 11/21/2019 0822   APPEARANCEUR HAZY (A) 11/21/2019 0822   LABSPEC 1.019 11/21/2019 0822   PHURINE 6.0 11/21/2019 0822   GLUCOSEU NEGATIVE 11/21/2019 0822   HGBUR MODERATE (A) 11/21/2019 0822   BILIRUBINUR NEGATIVE 11/21/2019 0822   KETONESUR NEGATIVE 11/21/2019  6144   PROTEINUR 30 (A) 11/21/2019 0822   UROBILINOGEN 1.0 01/05/2015 1550   NITRITE POSITIVE (A) 11/21/2019 0822   LEUKOCYTESUR NEGATIVE 11/21/2019 3154   Sepsis Labs Invalid input(s): PROCALCITONIN,  WBC,  LACTICIDVEN Microbiology Recent Results (from the past 240 hour(s))  Culture, blood (Routine X 2) w Reflex to ID Panel     Status: Abnormal   Collection Time: 11/21/19  9:09 AM   Specimen: BLOOD RIGHT HAND  Result Value Ref Range Status   Specimen Description BLOOD RIGHT HAND  Final   Special Requests   Final    BOTTLES DRAWN AEROBIC ONLY Blood Culture adequate volume   Culture  Setup Time   Final    GRAM POSITIVE COCCI IN  CLUSTERS AEROBIC BOTTLE ONLY CRITICAL RESULT CALLED TO, READ BACK BY AND VERIFIED WITH: Sarajane Marek [phone] SK  Performed at [facility], [place] [address], [place], [place] [address]    Culture STAPHYLOCOCCUS EPIDERMIDIS (A)  Final   Report Status 11/24/2019 FINAL  Final   Organism ID, Bacteria STAPHYLOCOCCUS EPIDERMIDIS  Final      Susceptibility   Staphylococcus epidermidis - MIC*    CIPROFLOXACIN >=8 RESISTANT Resistant     ERYTHROMYCIN <=0.25 SENSITIVE Sensitive     GENTAMICIN <=0.5 SENSITIVE Sensitive     OXACILLIN <=0.25 SENSITIVE Sensitive     TETRACYCLINE <=1 SENSITIVE Sensitive     VANCOMYCIN 1 SENSITIVE Sensitive     TRIMETH/SULFA 160 RESISTANT Resistant     CLINDAMYCIN <=0.25 SENSITIVE Sensitive     RIFAMPIN <=0.5 SENSITIVE Sensitive     Inducible Clindamycin NEGATIVE Sensitive     * STAPHYLOCOCCUS EPIDERMIDIS  Blood Culture ID Panel (Reflexed)     Status: Abnormal   Collection Time: 11/21/19  9:09 AM  Result Value Ref Range Status   Enterococcus species NOT DETECTED NOT DETECTED Final   Listeria monocytogenes NOT DETECTED NOT DETECTED Final   Staphylococcus species DETECTED (A) NOT DETECTED Final    Comment: Methicillin (oxacillin) susceptible coagulase negative staphylococcus. Possible blood culture contaminant (unless isolated from more than one blood culture draw or clinical case suggests pathogenicity). No antibiotic treatment is indicated for blood  culture contaminants. CRITICAL RESULT CALLED TO, READ BACK BY AND VERIFIED WITH: Sarajane Marek [id number] AT 1034 SK     Staphylococcus aureus (BCID) NOT DETECTED NOT DETECTED Final   Methicillin resistance NOT DETECTED NOT DETECTED Final   Streptococcus species NOT DETECTED NOT DETECTED Final   Streptococcus agalactiae NOT DETECTED NOT DETECTED Final   Streptococcus pneumoniae NOT DETECTED NOT DETECTED Final   Streptococcus pyogenes NOT DETECTED NOT DETECTED Final   Acinetobacter baumannii NOT DETECTED NOT  DETECTED Final   Enterobacteriaceae species NOT DETECTED NOT DETECTED Final   Enterobacter cloacae complex NOT DETECTED NOT DETECTED Final   Escherichia coli NOT DETECTED NOT DETECTED Final   Klebsiella oxytoca NOT DETECTED NOT DETECTED Final   Klebsiella pneumoniae NOT DETECTED NOT DETECTED Final   Proteus species NOT DETECTED NOT DETECTED Final   Serratia marcescens NOT DETECTED NOT DETECTED Final   Haemophilus influenzae NOT DETECTED NOT DETECTED Final   Neisseria meningitidis NOT DETECTED NOT DETECTED Final   Pseudomonas aeruginosa NOT DETECTED NOT DETECTED Final   Candida albicans NOT DETECTED NOT DETECTED Final   Candida glabrata NOT DETECTED NOT DETECTED Final   Candida krusei NOT DETECTED NOT DETECTED Final   Candida parapsilosis NOT DETECTED NOT DETECTED Final   Candida tropicalis NOT DETECTED NOT DETECTED Final    Comment:  Performed at [facility], [place] [address] [place], [place] [address]  Culture, blood (Routine X 2) w Reflex to ID Panel     Status: Abnormal   Collection Time: 11/21/19  9:13 AM   Specimen: BLOOD LEFT HAND  Result Value Ref Range Status   Specimen Description BLOOD LEFT HAND  Final   Special Requests   Final    BOTTLES DRAWN AEROBIC ONLY Blood Culture results may not be optimal due to an inadequate volume of blood received in culture bottles   Culture  Setup Time   Final    GRAM POSITIVE COCCI IN CLUSTERS AEROBIC BOTTLE ONLY CRITICAL VALUE NOTED.  VALUE IS CONSISTENT WITH PREVIOUSLY REPORTED AND CALLED VALUE.    Culture (A)  Final    STAPHYLOCOCCUS EPIDERMIDIS SUSCEPTIBILITIES PERFORMED ON PREVIOUS CULTURE WITHIN THE LAST 5 DAYS. Performed at [facility], [place] [address], [place], [place] [address]    Report Status 11/24/2019 FINAL  Final  Anaerobic culture     Status: None   Collection Time: 11/22/19  5:15 PM   Specimen: PATH Cytology CSF; Cerebrospinal Fluid  Result Value Ref Range Status   Specimen Description CSF  Final   Special  Requests NONE  Final   Culture   Final    NO ANAEROBES ISOLATED Performed at [facility], [place] [address], [place], [place] [address]    Report Status 11/27/2019 FINAL  Final  CSF culture     Status: None   Collection Time: 11/22/19  5:15 PM   Specimen: PATH Cytology CSF; Cerebrospinal Fluid  Result Value Ref Range Status   Specimen Description CSF  Final   Special Requests NONE  Final   Gram Stain   Final    WBC PRESENT, PREDOMINANTLY MONONUCLEAR NO ORGANISMS SEEN CYTOSPIN SMEAR    Culture   Final    NO GROWTH 3 DAYS Performed at [facility], [place] [address] [place], [place] [address]    Report Status 11/26/2019 FINAL  Final  Culture, fungus without smear     Status: None (Preliminary result)   Collection Time: 11/22/19  5:15 PM   Specimen: PATH Cytology CSF; Cerebrospinal Fluid  Result Value Ref Range Status   Specimen Description CSF  Final   Special Requests NONE  Final   Culture   Final    NO FUNGUS ISOLATED AFTER 7 DAYS Performed at [facility], [place] [address], [place], [place] [address]    Report Status PENDING  Incomplete  Culture, blood (Routine X 2) w Reflex to ID Panel     Status: None   Collection Time: 11/24/19  5:45 AM   Specimen: BLOOD  Result Value Ref Range Status   Specimen Description BLOOD RIGHT ANTECUBITAL  Final   Special Requests   Final    BOTTLES DRAWN AEROBIC AND ANAEROBIC Blood Culture adequate volume   Culture   Final    NO GROWTH 5 DAYS Performed at [facility], [place] [address] [place],  [address]    Report Status 11/29/2019 FINAL  Final  Culture, blood (Routine X 2) w Reflex to ID Panel     Status: None   Collection Time: 11/24/19  5:46 AM   Specimen: BLOOD LEFT HAND  Result Value Ref Range Status   Specimen Description BLOOD LEFT HAND  Final   Special Requests   Final    BOTTLES DRAWN AEROBIC AND ANAEROBIC Blood Culture adequate volume   Culture  Final    NO GROWTH 5 DAYS Performed at [facility], [place] [address] [place], [place] [address]    Report Status 11/29/2019 FINAL  Final  SARS Coronavirus 2 by RT PCR (hospital order, performed in [facility] hospital lab) Nasopharyngeal Nasopharyngeal Swab     Status: None   Collection Time: 11/27/19  5:20 PM   Specimen: Nasopharyngeal Swab  Result Value Ref Range Status   SARS Coronavirus 2 NEGATIVE NEGATIVE Final    Comment: (NOTE) SARS-CoV-2 target nucleic acids are NOT DETECTED.  The SARS-CoV-2 RNA is generally detectable in upper and lower respiratory specimens during the acute phase of infection. The lowest concentration of SARS-CoV-2 viral copies this assay can detect is 250 copies / mL. A negative result does not preclude SARS-CoV-2 infection and should not be used as the sole basis for treatment or other patient management decisions.  A negative result may occur with improper specimen collection / handling, submission of specimen other than nasopharyngeal swab, presence of viral mutation(s) within the areas targeted by this assay, and inadequate number of viral copies (<250 copies / mL). A negative result must be combined with clinical observations, patient history, and epidemiological information.  Fact Sheet for Patients:   StrictlyIdeas.no  Fact Sheet for Healthcare Providers: BankingDealers.co.za  This test is not yet approved or  cleared by the [place] FDA and has been authorized for detection and/or diagnosis of SARS-CoV-2 by FDA under an Emergency Use Authorization (EUA).  This EUA will remain in effect (meaning this test can be used) for the duration of the COVID-19 declaration under Section 564(b)(1) of the Act, 21 U.S.C. section 360bbb-3(b)(1), unless the authorization is terminated or revoked sooner.  Performed at [facility], [address], [place], [place] [address]      Time coordinating discharge: Over 30  minutes  SIGNED:   Charlynne Cousins, MD  [facility] 11/30/2019, 12:38 PM Pager   If 7PM-7AM, please contact night-coverage [email or web address] Password [email or web address]

## 2019-12-02 ENCOUNTER — Telehealth: Payer: Self-pay | Admitting: Orthopedic Surgery

## 2019-12-02 NOTE — Telephone Encounter (Signed)
Patient's wife Estill Bamberg called asking if [facility] could remove patient's stitches from right hip. Dr.  Marlou Sa preformed surgery from July 16,2021. Patient's wife Estill Bamberg asked for nurse or Dr. Marlou Sa to call [facility] to give permission. Patient phone number is [phone].

## 2019-12-02 NOTE — Telephone Encounter (Signed)
That sounds good, can they send pictures of the incisions after suture removal to my cell? Should be time to get them out but want to look at incisions too

## 2019-12-02 NOTE — Telephone Encounter (Signed)
Pls advise.  

## 2019-12-03 ENCOUNTER — Telehealth: Payer: Self-pay

## 2019-12-03 ENCOUNTER — Ambulatory Visit: Payer: Medicare Other | Admitting: Adult Health Nurse Practitioner

## 2019-12-03 ENCOUNTER — Telehealth: Payer: Self-pay | Admitting: *Deleted

## 2019-12-03 NOTE — Telephone Encounter (Signed)
Called patient on 12/03/2019 , 4:13 PM in an attempt to reach the patient for a hospital follow up. Spoke with the patient's spouse.   Admit date: 11/15/19 Discharge: 11/30/19   He does not have any questions or concerns about medications from the hospital admission. The patient's medications were reviewed over the phone, they were counseled to bring in all current medications to the hospital follow up visit. The spouse was advised to call our office when the patient is discharged, to set up an appointment.  I advised the patient to call if any questions or concerns arise about the hospital admission or medications    Home health was not started in the hospital. The patient was discharged to [facility] for physical therapy. Spouse states physical therapy has started, but he has not stood up yet. All questions were answered and a follow up appointment was made. No appointment was scheduled at this time.  Prior to Admission medications   Medication Sig Start Date End Date Taking? Authorizing Provider  acetaminophen (TYLENOL) 500 MG tablet Take 1,000 mg by mouth in the morning and at bedtime.     [provider]  aspirin EC 81 MG tablet Take 2 tablets (162 mg total) by mouth daily. 10/03/19   Garvin Fila, MD  atorvastatin (LIPITOR) 80 MG tablet Take 1 tablet (80 mg total) by mouth daily. 12/01/19   Charlynne Cousins, MD  Calcium Carb-Cholecalciferol (CALCIUM+D3 PO) Take 1 tablet by mouth daily.    [provider]  Cholecalciferol (VITAMIN D-3) 5000 units TABS Take 5,000 Units by mouth daily.    [provider]  ferrous sulfate 325 (65 FE) MG tablet Take 325 mg by mouth daily with breakfast.    [provider]  fludrocortisone (FLORINEF) 0.1 MG tablet Take 2 tablets Daily for Low BP Patient taking differently: Take 0.1 mg by mouth daily. Daily for Low BP 08/20/19   [name], MD  glucose blood test strip Check blood sugar 1 time  daily-DX-E11.9 Patient taking differently: 1 each by Other route as needed. Check blood sugar 1 time daily-DX-E11.9 07/07/17   [name], MD  linezolid (ZYVOX) 600 MG tablet Take 1 tablet (600 mg total) by mouth every 12 (twelve) hours for 9 days. 11/30/19 12/09/19  Charlynne Cousins, MD  Multiple Vitamin (MULTIVITAMIN WITH MINERALS) TABS tablet Take 1 tablet by mouth daily.    [provider]  Omega-3 Fatty Acids (FISH OIL) 600 MG CAPS Take 1 capsule by mouth daily.    [provider]  pantoprazole (PROTONIX) 40 MG tablet Take 1 tablet Daily for Indigestion & Heartburn Patient taking differently: Take 40 mg by mouth daily.  03/11/19   [name], MD  polyethylene glycol [facility]) packet Take 17 g by mouth daily. Patient taking differently: Take 17 g by mouth 2 (two) times daily.  05/22/17   Barton Dubois, MD  rivaroxaban (XARELTO) 10 MG TABS tablet Take 1 tablet (10 mg total) by mouth daily for 9 days. 12/01/19 12/10/19  Charlynne Cousins, MD  Zinc 50 MG TABS Take 1 tablet by mouth daily.    [provider]

## 2019-12-03 NOTE — Telephone Encounter (Signed)
I have attempted to contact this patient by phone with the following results: left message to return my call on answering machine.

## 2019-12-03 NOTE — Telephone Encounter (Signed)
I have attempted to contact this patient by phone with the following results: no answer.

## 2019-12-03 NOTE — Telephone Encounter (Signed)
Sounds good, thank you 

## 2019-12-03 NOTE — Telephone Encounter (Signed)
IC unable to send photo. I advised not to remove if incision did not look healed or if there was question. Also told them that they needed to call office and schedule his post op appointment with Dr Marlou Sa.  Faxed note to [phone]

## 2019-12-03 NOTE — Telephone Encounter (Signed)
The nurse at his facility called stating the pt monitor was not working. I told her to call [facility] tech support for additional help.

## 2019-12-11 ENCOUNTER — Telehealth: Payer: Self-pay | Admitting: Orthopedic Surgery

## 2019-12-11 NOTE — Telephone Encounter (Signed)
Patient's wife called.   She is requesting we call the patient's rehab facility to establish whether or not the patient can increase his 50% weight baring status    Call back: [phone]

## 2019-12-11 NOTE — Telephone Encounter (Signed)
Sure okay to advance weightbearing but he needs follow-up next week

## 2019-12-11 NOTE — Telephone Encounter (Signed)
Please advise. Thanks.  

## 2019-12-12 NOTE — Telephone Encounter (Signed)
Called facility and clarified WTB status. Tried to schedule appt for next week per Dr Marlou Sa, but receptionist that schedules appointments not available. The PT will have her call us back to get appt scheduled for patient.

## 2019-12-13 ENCOUNTER — Telehealth: Payer: Self-pay | Admitting: *Deleted

## 2019-12-13 LAB — CULTURE, FUNGUS WITHOUT SMEAR

## 2019-12-13 NOTE — Telephone Encounter (Signed)
Spouse called and reported the patient is still in the rehab center. She requested an RX for eye drops, due to the patient's eyes being red and itchy. Per Dr Melford Aase, he is not on the approved doctor list at the rehab and cannot prescribe medication for the patient at this time. A message was left to inform the spouse. She was advised to speak with nurse and request an RX from the house doctor.

## 2019-12-16 ENCOUNTER — Telehealth: Payer: Self-pay | Admitting: Orthopedic Surgery

## 2019-12-16 ENCOUNTER — Ambulatory Visit: Payer: Medicare Other | Admitting: Adult Health

## 2019-12-16 NOTE — Telephone Encounter (Signed)
Patient's daughter returned call from [employer]. Asking for a return call to [phone] as soon as possible

## 2019-12-16 NOTE — Telephone Encounter (Signed)
Patient's wife would like a call from Dr. Marlou Sa. Her call back number is [phone]

## 2019-12-16 NOTE — Telephone Encounter (Signed)
IC back. They were inquiring about weight bearing status for patient and whether or not it would be increased at his next office visit. I advised that it would be determined once patient had exam and xrays obtained.

## 2019-12-16 NOTE — Telephone Encounter (Signed)
This has been done. See other note.

## 2019-12-16 NOTE — Telephone Encounter (Signed)
Tried calling to discuss. No answer, no VM to LM

## 2019-12-19 ENCOUNTER — Ambulatory Visit (INDEPENDENT_AMBULATORY_CARE_PROVIDER_SITE_OTHER): Payer: Medicare Other

## 2019-12-19 ENCOUNTER — Ambulatory Visit: Payer: Self-pay

## 2019-12-19 ENCOUNTER — Ambulatory Visit: Payer: Medicare Other | Admitting: Orthopedic Surgery

## 2019-12-19 DIAGNOSIS — S32691A Other specified fracture of right ischium, initial encounter for closed fracture: Secondary | ICD-10-CM

## 2019-12-19 DIAGNOSIS — M25551 Pain in right hip: Secondary | ICD-10-CM | POA: Diagnosis not present

## 2019-12-20 ENCOUNTER — Ambulatory Visit: Payer: Medicare Other | Admitting: Surgical

## 2019-12-21 ENCOUNTER — Encounter: Payer: Self-pay | Admitting: Orthopedic Surgery

## 2019-12-21 NOTE — Progress Notes (Signed)
Post-Op Visit Note   Patient: [name]           Date of Birth: 1934/01/31           MRN: [id number] Visit Date: 12/19/2019 PCP: [name], MD   Assessment & Plan:  Chief Complaint:  Chief Complaint  Patient presents with  . Post-op Follow-up   Visit Diagnoses:  1. Other closed fracture of right ischium, initial encounter ([place])   2. Pain in right hip     Plan: Axle is an 86 year old patient who is now about a month out right femoral IM at bedtime for hip fracture.  States he has pain in the leg.  On exam there is no real pain with rotation or axial loading.  Radiographs show good healing of the fracture with no evidence of loosening of the hardware or cut out.  Lesser trochanter avulsion fragment is present.  At this time I want to allow him to be weightbearing as tolerated with 2-week return repeat radiographs and if they look stable he can be discharged at that time.  No knee pain with range of motion.  No knee effusion today on the right.  Follow-up to see [facility] for final check in 2 weeks.  Follow-Up Instructions: Return in about 2 weeks (around 01/02/2020).   Orders:  Orders Placed This Encounter  Procedures  . XR FEMUR, MIN 2 VIEWS RIGHT  . XR Pelvis 1-2 Views   No orders of the defined types were placed in this encounter.   Imaging: No results found.  PMFS History: Patient Active Problem List   Diagnosis Date Noted  . Bacteremia due to Staphylococcus epidermidis 11/24/2019  . Stroke (cerebrum) ([place])   . Elevated troponin   . Closed right hip fracture ([place]) 11/15/2019  . Unilateral primary osteoarthritis, right knee 11/13/2019  . At high risk for falls 05/29/2019  . Chronic bilateral low back pain without sciatica 02/07/2019  . Statin intolerance 11/16/2018  . Hyperlipidemia, mixed 08/15/2018  . Low back pain 07/18/2018  . History of lacunar cerebrovascular accident (CVA) 02/12/2018  . Dizziness 02/12/2018  . Myofascial pain 12/18/2017  .  Spondylosis without myelopathy or radiculopathy, lumbar region 12/18/2017  . Lumbar radiculopathy, right 12/18/2017  . Cholelithiasis 05/17/2017  . Sinus Bradycardia 05/17/2017  . Thoracic radiculopathy 04/12/2017  . Primary osteoarthritis of right knee 12/14/2016  . DDD (degenerative disc disease), lumbar 10/10/2016  . Lumbar facet arthropathy 08/29/2016  . Idiopathic scoliosis 03/22/2016  . Diabetic neuropathy ([place]) 03/22/2016  . Dysautonomia orthostatic hypotension syndrome ([place]) 02/25/2016  . CKD stage 3 due to type 2 diabetes mellitus ([place]) 02/19/2016  . Coronary artery disease involving coronary bypass graft of native heart without angina pectoris   . Diabetes mellitus type 2, diet-controlled ([place])   . 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 Medication management 08/11/2015  . 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 PVD (peripheral vascular disease) ([place]) 01/01/2014  . Hyperlipidemia associated with type 2 diabetes mellitus ([place]) 04/11/2013  . Supine hypertension   . Vitamin D deficiency   . Vitamin B 12 deficiency   . Coronary atherosclerosis- s/p PCI to LAD in 2009 and PCI to RCA in 2011 02/27/2009  . Abdominal aortic aneurysm ([place]) 02/27/2009  . GERD 02/27/2009   Past Medical History:  Diagnosis Date  . AAA (abdominal aortic aneurysm) ([place])   . 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 CAD (coronary artery disease)    a. s/p CABGx4 in 07/2015.  . Carotid artery  disease ([place])   . Colon polyps   . Diabetes ([place])   . Difficult intubation   . 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 Esophageal reflux   . High cholesterol   . History of IBS 02/27/2009  . Hypertension    pt denies, he says he has a h/o hypotension. If BP up he adjusts the [place]  . Jejunitis with partial SBO 05/20/2017  . Orthostatic hypotension   . Prostatitis   . Stroke ([place])   . TIA (transient ischemic attack)   . Upper GI bleed 06/24/2018  . Vitamin B 12 deficiency   . Vitamin D deficiency     Family History  Problem  Relation Age of Onset  . Heart attack Father        died age 47  . Heart attack Brother        died age 35  . Anuerysm Brother        aortic  . Heart attack Sister        died age 60  . Colon cancer Sister   . Liver cancer Sister   . Diabetes Maternal Grandmother   . Arthritis Mother   . Dementia Mother   . Heart Problems Brother   . Heart Problems Brother   . Heart Problems Brother   . Heart Problems Brother   . Healthy Daughter   . Healthy Son   . Esophageal cancer Neg Hx   . Inflammatory bowel disease Neg Hx   . Pancreatic cancer Neg Hx   . Stomach cancer Neg Hx     Past Surgical History:  Procedure Laterality Date  . BIOPSY  06/25/2018   Procedure: BIOPSY;  Surgeon: Rush Landmark Telford Nab., MD;  Location: [place];  Service: Gastroenterology;;  . BIOPSY  04/24/2019   Procedure: BIOPSY;  Surgeon: Irving Copas., MD;  Location: [facility];  Service: Gastroenterology;;  . BLADDER SURGERY  1969   traumatic pelvic fractures, urethral and bladder repair  . CARDIAC CATHETERIZATION N/A 07/01/2015   Procedure: Left Heart Cath and Coronary Angiography;  Surgeon: Wellington Hampshire, MD;  Location: [facility];  Service: Cardiovascular;  Laterality: N/A;  . COLON RESECTION N/A 05/17/2017   Procedure: DIAGNOSTIC LAPAROSCOPY,;  Surgeon: Leighton Ruff, MD;  Location: [facility];  Service: General;  Laterality: N/A;  . COLONOSCOPY WITH PROPOFOL N/A 04/24/2019   Procedure: COLONOSCOPY WITH PROPOFOL;  Surgeon: Irving Copas., MD;  Location: [facility];  Service: Gastroenterology;  Laterality: N/A;  . CORONARY ANGIOPLASTY WITH STENT PLACEMENT    . CORONARY ARTERY BYPASS GRAFT N/A 07/06/2015   Procedure: CORONARY ARTERY BYPASS GRAFTING (CABG)x 4   utilizing the left internal mammary artery and endoscopically harvested bilateral  sapheneous vein.;  Surgeon: Ivin Poot, MD;  Location: [place];  Service: Open Heart Surgery;  Laterality: N/A;  . ESOPHAGEAL DILATION   04/24/2019   Procedure: ESOPHAGEAL DILATION;  Surgeon: Rush Landmark Telford Nab., MD;  Location: [facility];  Service: Gastroenterology;;  . ESOPHAGOGASTRODUODENOSCOPY (EGD) WITH PROPOFOL N/A 06/25/2018   Procedure: ESOPHAGOGASTRODUODENOSCOPY (EGD) WITH PROPOFOL;  Surgeon: Irving Copas., MD;  Location: [place];  Service: Gastroenterology;  Laterality: N/A;  . ESOPHAGOGASTRODUODENOSCOPY (EGD) WITH PROPOFOL N/A 04/24/2019   Procedure: ESOPHAGOGASTRODUODENOSCOPY (EGD) WITH PROPOFOL;  Surgeon: Rush Landmark Telford Nab., MD;  Location: [facility];  Service: Gastroenterology;  Laterality: N/A;  . FEMUR IM NAIL Right 11/16/2019   Procedure: RIGHT HIP INTRAMEDULLARY (IM) NAIL FEMORAL;  Surgeon: Meredith Pel, MD;  Location: [place];  Service:  Orthopedics;  Laterality: Right;  . HEMOSTASIS CLIP PLACEMENT  04/24/2019   Procedure: HEMOSTASIS CLIP PLACEMENT;  Surgeon: Irving Copas., MD;  Location: [facility];  Service: Gastroenterology;;  . KNEE SURGERY    . LOOP RECORDER INSERTION N/A 02/19/2018   Procedure: LOOP RECORDER INSERTION;  Surgeon: Deboraha Sprang, MD;  Location: [place] [facility];  Service: Cardiovascular;  Laterality: N/A;  . POLYPECTOMY  04/24/2019   Procedure: POLYPECTOMY;  Surgeon: Rush Landmark Telford Nab., MD;  Location: [facility];  Service: Gastroenterology;;  . Lia Foyer LIFTING INJECTION  04/24/2019   Procedure: SUBMUCOSAL LIFTING INJECTION;  Surgeon: Irving Copas., MD;  Location: [facility];  Service: Gastroenterology;;  . TEE WITHOUT CARDIOVERSION N/A 07/06/2015   Procedure: TRANSESOPHAGEAL ECHOCARDIOGRAM (TEE);  Surgeon: Ivin Poot, MD;  Location: [place];  Service: Open Heart Surgery;  Laterality: N/A;   Social History   Occupational History  . Occupation: Retired  Tobacco Use  . Smoking status: Former Smoker    Quit date: 07/16/1963    Years since quitting: 56.4  . Smokeless tobacco: Former Systems developer    Types: [place] date: 1989   Media planner  . Vaping Use: Never used  Substance and Sexual Activity  . Alcohol use: No  . Drug use: No  . Sexual activity: Not Currently

## 2020-01-02 ENCOUNTER — Other Ambulatory Visit: Payer: Self-pay | Admitting: Internal Medicine

## 2020-01-02 ENCOUNTER — Telehealth: Payer: Self-pay | Admitting: Orthopedic Surgery

## 2020-01-02 ENCOUNTER — Other Ambulatory Visit: Payer: Self-pay | Admitting: Neurology

## 2020-01-02 NOTE — Telephone Encounter (Signed)
IC patients wife [name]. Patient was scheduled to see [facility] tomorrow afternoon but facility he is residing at-[facility]-is on lock down due to [place]. She was advised that he will not be able to leave facility for at least 28 days. She is concerned how this will impact his recovery and are there orders that we need to send over to the facility? She said that we could contact Theodosia Blender at [facility] with any orders [phone]

## 2020-01-02 NOTE — Telephone Encounter (Signed)
Patient's daughter called.   There was a COVID outbreak in the nursing facility. Wanted to know what they should do since they can't come in.   Call back: [phone]

## 2020-01-02 NOTE — Telephone Encounter (Signed)
X-rays look very good 2 weeks ago.  Continue with weightbearing as tolerated and follow-up in 28 days for final x-rays.

## 2020-01-03 ENCOUNTER — Ambulatory Visit: Payer: Medicare Other | Admitting: Surgical

## 2020-01-03 NOTE — Telephone Encounter (Signed)
I called Estill Bamberg and advised. She states that patient still has to have help getting up and down and have help when using his walker. She asks that we call [facility] and request additional PT while patient is there.  OK to do?

## 2020-01-03 NOTE — Telephone Encounter (Signed)
Yes okay for that

## 2020-01-07 NOTE — Telephone Encounter (Signed)
[facility]

## 2020-01-11 ENCOUNTER — Other Ambulatory Visit: Payer: Self-pay | Admitting: Internal Medicine

## 2020-01-11 DIAGNOSIS — R0989 Other specified symptoms and signs involving the circulatory and respiratory systems: Secondary | ICD-10-CM

## 2020-01-13 ENCOUNTER — Telehealth: Payer: Self-pay

## 2020-01-13 NOTE — Telephone Encounter (Signed)
Manual LINQ transmission received.  1 pause episode noted on 12/16/19 at 22:15, 4 sec duration.  LMOVM requesting call back to DC.  Direct number and office hours provided.  Will discuss any symptoms with pause episode.

## 2020-01-13 NOTE — Telephone Encounter (Signed)
The pt daughter answered the phone but she not on the dpr. She put me on speaker phone so the pt wife who is on the dpr could hear me. I let her know that we needed a transmission with his home monitor. The pt wife agreed to send one Today. I gave them my direct office number to call me if they needed any help.

## 2020-01-13 NOTE — Telephone Encounter (Signed)
-----   Message from Rosalyn Charters, RN sent at 01/13/2020 10:14 AM EDT ----- Regarding: LINQ manual transmission Please call pt to request manual LINQ transmission for review of any unreviewed episodes.  Appears monitor was disconnected from July-August.  Thanks! Raquel Sarna

## 2020-01-15 ENCOUNTER — Other Ambulatory Visit: Payer: Self-pay

## 2020-01-15 ENCOUNTER — Ambulatory Visit: Payer: Medicare Other | Admitting: Physician Assistant

## 2020-01-15 ENCOUNTER — Encounter: Payer: Self-pay | Admitting: Physician Assistant

## 2020-01-15 VITALS — BP 124/68 | HR 59 | Temp 97.5°F | Wt 180.0 lb

## 2020-01-15 DIAGNOSIS — N3000 Acute cystitis without hematuria: Secondary | ICD-10-CM

## 2020-01-15 DIAGNOSIS — I6310 Cerebral infarction due to embolism of unspecified precerebral artery: Secondary | ICD-10-CM

## 2020-01-15 DIAGNOSIS — S72001G Fracture of unspecified part of neck of right femur, subsequent encounter for closed fracture with delayed healing: Secondary | ICD-10-CM

## 2020-01-15 DIAGNOSIS — E039 Hypothyroidism, unspecified: Secondary | ICD-10-CM | POA: Diagnosis not present

## 2020-01-15 DIAGNOSIS — R0989 Other specified symptoms and signs involving the circulatory and respiratory systems: Secondary | ICD-10-CM

## 2020-01-15 MED ORDER — AMLODIPINE BESYLATE 5 MG PO TABS: ORAL_TABLET | ORAL | 3 refills | Status: DC

## 2020-01-15 MED ORDER — CIPROFLOXACIN HCL 500 MG PO TABS: 500.0000 mg | ORAL_TABLET | Freq: Two times a day (BID) | ORAL | 0 refills | Status: DC

## 2020-01-15 NOTE — Progress Notes (Addendum)
Hospital follow up  Assessment and Plan: Hospital visit follow up for  Acute cystitis without hematuria -     ciprofloxacin (CIPRO) 500 MG tablet; Take 1 tablet (500 mg total) by mouth 2 (two) times daily for 14 days. -     Urinalysis, Routine w reflex microscopic -     Urine Culture Will treat longer for possible prostate infection Recent normal CT AB No evidence of urinary retention at this time, reasons to go to the ER dicussed with patient and wife Check labs  Labile hypertension -     amLODipine (NORVASC) 5 MG tablet; Take 1 tablet Daily for BP -     CBC with Differential/Platelet -     COMPLETE METABOLIC PANEL WITH GFR - continue medications, DASH diet, exercise and monitor at home. Call if greater than 130/80.   Cerebrovascular accident (CVA) due to embolism of precerebral artery (HCC) Continue bASA, continue chol Follow up neuro and PT  Closed fracture of right hip with delayed healing, subsequent encounter Continue follow up ortho  Hypothyroidism, unspecified type -     TSH   All medications were reviewed with patient and family and fully reconciled. All questions answered fully, and patient and family members were encouraged to call the office with any further questions or concerns. Discussed goal to avoid readmission related to this diagnosis.  Medications Discontinued During This Encounter  Medication Reason  . atorvastatin (LIPITOR) 80 MG tablet   . amLODipine (NORVASC) 10 MG tablet     CAN NOT DO FOR BCBS REGULAR OR MEDICARE Over 40 minutes of exam, counseling, chart review, and complex, high/moderate level critical decision making was performed this visit.   Future Appointments  Date Time Provider [place]  01/27/2020 11:05 AM [facility] DEVICE REMOTES [facility]  01/31/2020  9:45 AM [name], MD [facility] None  02/12/2020  4:20 PM Lelon Perla, MD [facility]  03/02/2020 11:05 AM [facility] DEVICE REMOTES [facility]  03/05/2020 11:00 AM [name], MD [facility] None  04/06/2020 11:05 AM [facility] DEVICE REMOTES [facility]  05/11/2020 11:05 AM [facility] DEVICE REMOTES [facility]  06/01/2020  3:00 PM Liane Comber, NP [facility] None     HPI 86 y.o.male presents for follow up for transition from recent hospitalization and [facility] stay for rehab post stroke. Admit date to the hospital was 11/15/19, patient was discharged from the hospital on 11/30/19, and [facility] clinical staff contacted the office the day after discharge from the hospital to set up a follow up appointment. The discharge summary, medications, and diagnostic test results were reviewed before meeting with the patient. The patient was admitted for:   11/16/2019 hospital course became complicated by postop anemia requiring 1 unit of packed red blood cells. He then developed metabolic encephalopathy with MRI showing an acute CVA, for which neurology was consulted. EEG was overall unremarkable. He was started on pravastatin and bASA.  He then developed elevated troponins thought likely to demand ischemia, echo showed preserved EF with grade 2 diastolic heart failure.  Due to his patient encephalopathy his work-up was besides a stroke negative, his Lyrica was taper off, he ended up growing MSSA bacteremia and started on Ancef, his repeated blood cultures remain negative.   Today he presents with new urinary symptoms since [facility]day after having a fracture to his right femur with subsqent stroke during the surgery and bactermemia.  He has been  having dysuria, abnormal smell in urine and some left lower quadrant pain x Saturday. He has had some constipation, taking miralax. No nausea or vomiting. He has frequency and some difficulty with initiating stream but he is able to urinate after he starts with good flow.  He  denies chills, fever, flank pain, hematuria,  and urinary incontinence. Had CT AB pelvis 11/24/2019 showed AAA at 4.4cm. normal prostate, normal bowels.   Wife is giving the majority of the history but when the patient talks it is clear and relavent. He was started on xarelto in the hospital.   He has had a swallow study 11/29/19 after his stroke but after rehab has been cleared to drink liquids. He still has some choking with water, he was reminded to drink slowly and go back to tilting his head.   He had some right sided weakness and aphasia post CVA but he has been doing rehab for this and wife states it has improved. He is in a wheel chair today due to continuing leg pain from his surgery but he is able to walk at home with walker.   He was taken off lyrica in the hospital due to sedation, he would like to start back on it for pain.   Home health is involved.   Images while in the hospital: Pelvis Portable  Result Date: 11/16/2019 CLINICAL DATA:  Right hip surgery. EXAM: PORTABLE PELVIS 1-2 VIEWS COMPARISON:  Yesterday. FINDINGS: Interval intramedullary rod and screw fixation of the previously demonstrated comminuted right intertrochanteric fracture. Essentially anatomic position and alignment of the major fragments. The lesser trochanter fragment remains medially displaced. IMPRESSION: Hardware fixation of the previously demonstrated comminuted right intertrochanteric fracture. Electronically Signed   By: Claudie Revering M.D.   On: 11/16/2019 14:07   DG C-Arm 1-60 Min  Result Date: 11/16/2019 CLINICAL DATA:  Right hip fracture. EXAM: DG C-ARM 1-60 MIN; DG HIP (WITH OR WITHOUT PELVIS) 2-3V RIGHT COMPARISON:  11/15/2019 FINDINGS: Eighteen C-arm views of the right hip and femur demonstrate intramedullary rod and compression screw fixation of the previously demonstrated comminuted right intertrochanteric fracture. Anatomic position and alignment on the later images. IMPRESSION: Hardware fixation of  the previously demonstrated right intertrochanteric fracture. Electronically Signed   By: Claudie Revering M.D.   On: 11/16/2019 14:06   DG HIP UNILAT WITH PELVIS 2-3 VIEWS RIGHT  Result Date: 11/16/2019 CLINICAL DATA:  Right hip fracture. EXAM: DG C-ARM 1-60 MIN; DG HIP (WITH OR WITHOUT PELVIS) 2-3V RIGHT COMPARISON:  11/15/2019 FINDINGS: Eighteen C-arm views of the right hip and femur demonstrate intramedullary rod and compression screw fixation of the previously demonstrated comminuted right intertrochanteric fracture. Anatomic position and alignment on the later images. IMPRESSION: Hardware fixation of the previously demonstrated right intertrochanteric fracture. Electronically Signed   By: Claudie Revering M.D.   On: 11/16/2019 14:06   DG Hip Unilat With Pelvis 2-3 Views Right  Result Date: 11/15/2019 CLINICAL DATA:  Fall, right hip pain EXAM: DG HIP (WITH OR WITHOUT PELVIS) 2-3V RIGHT COMPARISON:  None. FINDINGS: Comminuted intertrochanteric right hip fracture seen with mild Verus angulation and avulsion of the lesser trochanter. Femoral head is still seated within the right acetabulum. Limited evaluation of the left hip is unremarkable. The pelvis and sacrum appear intact. IMPRESSION: Comminuted intertrochanteric right hip fracture. Electronically Signed   By: Fidela Salisbury MD   On: 11/15/2019 15:35     Current Outpatient Medications (Endocrine & Metabolic):  .  levothyroxine (SYNTHROID) 25 MCG tablet, Take 25  mcg by mouth daily before breakfast. .  fludrocortisone (FLORINEF) 0.1 MG tablet, Take 2 tablets Daily for Low BP (Patient taking differently: Take 0.1 mg by mouth daily. Daily for Low BP)  Current Outpatient Medications (Cardiovascular):  .  pravastatin (PRAVACHOL) 80 MG tablet, Take 80 mg by mouth daily. Marland Kitchen  amLODipine (NORVASC) 5 MG tablet, Take 1 tablet Daily for BP   Current Outpatient Medications (Analgesics):  .  acetaminophen (TYLENOL) 500 MG tablet, Take 1,000 mg by mouth in the  morning and at bedtime.  Marland Kitchen  aspirin EC 81 MG tablet, Take 2 tablets (162 mg total) by mouth daily.  Current Outpatient Medications (Hematological):  .  ferrous sulfate 325 (65 FE) MG tablet, Take 325 mg by mouth daily with breakfast. .  rivaroxaban (XARELTO) 10 MG TABS tablet, Take 1 tablet (10 mg total) by mouth daily for 9 days.  Current Outpatient Medications (Other):  Marland Kitchen  Calcium Carb-Cholecalciferol (CALCIUM+D3 PO), Take 1 tablet by mouth daily. .  Cholecalciferol (VITAMIN D-3) 5000 units TABS, Take 5,000 Units by mouth daily. .  ciprofloxacin (CIPRO) 500 MG tablet, Take 1 tablet (500 mg total) by mouth 2 (two) times daily for 14 days. Marland Kitchen  glucose blood test strip, Check blood sugar 1 time daily-DX-E11.9 (Patient taking differently: 1 each by Other route as needed. Check blood sugar 1 time daily-DX-E11.9) .  Multiple Vitamin (MULTIVITAMIN WITH MINERALS) TABS tablet, Take 1 tablet by mouth daily. .  Omega-3 Fatty Acids (FISH OIL) 600 MG CAPS, Take 1 capsule by mouth daily. .  pantoprazole (PROTONIX) 40 MG tablet, Take 1 tablet Daily for Indigestion & Heartburn (Patient taking differently: Take 40 mg by mouth daily. ) .  polyethylene glycol (MIRALAX / GLYCOLAX) packet, Take 17 g by mouth daily. (Patient taking differently: Take 17 g by mouth 2 (two) times daily. ) .  Zinc 50 MG TABS, Take 1 tablet by mouth daily.  Past Medical History:  Diagnosis Date  . AAA (abdominal aortic aneurysm) ([place])   . 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 CAD (coronary artery disease)    a. s/p CABGx4 in 07/2015.  . Carotid artery disease ([place])   . Colon polyps   . Diabetes ([place])   . Difficult intubation   . 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 Esophageal reflux   . High cholesterol   . History of IBS 02/27/2009  . Hypertension    pt denies, he says he has a h/o hypotension. If BP up he adjusts the Florinef  . Jejunitis with partial SBO 05/20/2017  . Orthostatic hypotension   . Prostatitis   . Stroke ([place])    . TIA (transient ischemic attack)   . Upper GI bleed 06/24/2018  . Vitamin B 12 deficiency   . Vitamin D deficiency      Allergies  Allergen Reactions  . Other Other (See Comments)    Pt reports allergic to a medication but unsure of name or type of med  . Cymbalta [Duloxetine Hcl] Other (See Comments)    Dizziness, hallucinations.   . Keflex [Cephalexin] Other (See Comments)    dizziness  . Simvastatin Other (See Comments)    Joint pain  . Sudafed [Pseudoephedrine] Other (See Comments)    Dizziness    ROS: all negative except above.   Physical Exam: Filed Weights   01/15/20 1038  Weight: 180 lb (81.6 kg)   BP 124/68   Pulse (!) 59   Temp (!) 97.5 F (36.4 C)  Wt 180 lb (81.6 kg) Comment: WIFE REPORTS THIS WT ON SEPT 15TH 2021--TAKEN AT [facility]SING [facility]  SpO2 97%   BMI 24.41 kg/m  General Appearance: Well nourished, in no apparent distress. Eyes: PERRLA, EOMs, conjunctiva no swelling or erythema ENT/Mouth: Ext aud canals clear, TMs without erythema, bulging. No erythema, swelling, or exudate on post pharynx.  Tonsils not swollen or erythematous. Hearing decreased Neck: Supple. Respiratory: Respiratory effort normal, BS equal bilaterally without rales, rhonchi, wheezing or stridor.  Cardio: RRR with no MRGs.  Abdomen: Soft, + BS, + tenderness LLQ, no guarding, rebound, hernias, masses.  Musculoskeletal: patient in wheelchair, gait not accessed, he had decreased ROM bilateral legs but pain with right leg with movement.  Skin: Warm, dry without rashes, lesions Neuro: Cranial nerves intact. Normal muscle tone, no cerebellar symptoms, negative pronator drift Psych: Awake and oriented X 3, normal affect, Insight and Judgment appropriate.      Vicie Mutters, PA-C 12:54 PM [facility]

## 2020-01-15 NOTE — Addendum Note (Signed)
Addended by: Vicie Mutters R on: 01/15/2020 01:45 PM   Modules accepted: Orders

## 2020-01-16 LAB — URINE CULTURE
MICRO NUMBER:: 10955791
Result:: NO GROWTH
SPECIMEN QUALITY:: ADEQUATE

## 2020-01-16 LAB — COMPLETE METABOLIC PANEL WITH GFR
AG Ratio: 1 (calc) (ref 1.0–2.5)
ALT: 7 U/L — ABNORMAL LOW (ref 9–46)
AST: 18 U/L (ref 10–35)
Albumin: 3.3 g/dL — ABNORMAL LOW (ref 3.6–5.1)
Alkaline phosphatase (APISO): 109 U/L (ref 35–144)
BUN/Creatinine Ratio: 12 (calc) (ref 6–22)
BUN: 15 mg/dL (ref 7–25)
CO2: 34 mmol/L — ABNORMAL HIGH (ref 20–32)
Calcium: 9 mg/dL (ref 8.6–10.3)
Chloride: 99 mmol/L (ref 98–110)
Creat: 1.21 mg/dL — ABNORMAL HIGH (ref 0.70–1.11)
GFR, Est African American: 63 mL/min/{1.73_m2} (ref >=60)
GFR, Est Non African American: 55 mL/min/{1.73_m2} — ABNORMAL LOW (ref >=60)
Globulin: 3.2 g/dL (calc) (ref 1.9–3.7)
Glucose, Bld: 123 mg/dL — ABNORMAL HIGH (ref 65–99)
Potassium: 2.9 mmol/L — ABNORMAL LOW (ref 3.5–5.3)
Sodium: 143 mmol/L (ref 135–146)
Total Bilirubin: 0.6 mg/dL (ref 0.2–1.2)
Total Protein: 6.5 g/dL (ref 6.1–8.1)

## 2020-01-16 LAB — CBC WITH DIFFERENTIAL/PLATELET
Absolute Monocytes: 476 cells/uL (ref 200–950)
Basophils Absolute: 43 cells/uL (ref 0–200)
Basophils Relative: 0.5 %
Eosinophils Absolute: 391 cells/uL (ref 15–500)
Eosinophils Relative: 4.6 %
HCT: 37.6 % — ABNORMAL LOW (ref 38.5–50.0)
Hemoglobin: 12.1 g/dL — ABNORMAL LOW (ref 13.2–17.1)
Lymphs Abs: 3341 cells/uL (ref 850–3900)
MCH: 31.4 pg (ref 27.0–33.0)
MCHC: 32.2 g/dL (ref 32.0–36.0)
MCV: 97.7 fL (ref 80.0–100.0)
MPV: 10.2 fL (ref 7.5–12.5)
Monocytes Relative: 5.6 %
Neutro Abs: 4250 cells/uL (ref 1500–7800)
Neutrophils Relative %: 50 %
Platelets: 232 10*3/uL (ref 140–400)
RBC: 3.85 10*6/uL — ABNORMAL LOW (ref 4.20–5.80)
RDW: 16.1 % — ABNORMAL HIGH (ref 11.0–15.0)
Total Lymphocyte: 39.3 %
WBC: 8.5 10*3/uL (ref 3.8–10.8)

## 2020-01-16 LAB — URINALYSIS, ROUTINE W REFLEX MICROSCOPIC
Bacteria, UA: NONE SEEN /HPF
Bilirubin Urine: NEGATIVE
Glucose, UA: NEGATIVE
Hgb urine dipstick: NEGATIVE
Hyaline Cast: NONE SEEN /LPF
Ketones, ur: NEGATIVE
Leukocytes,Ua: NEGATIVE
Nitrite: NEGATIVE
RBC / HPF: NONE SEEN /HPF (ref 0–2)
Specific Gravity, Urine: 1.015 (ref 1.001–1.03)
Squamous Epithelial / HPF: NONE SEEN /HPF (ref <=5)
WBC, UA: NONE SEEN /HPF (ref 0–5)
pH: 6.5 (ref 5.0–8.0)

## 2020-01-16 LAB — TSH: TSH: 3.97 mIU/L (ref 0.40–4.50)

## 2020-01-17 ENCOUNTER — Ambulatory Visit (INDEPENDENT_AMBULATORY_CARE_PROVIDER_SITE_OTHER): Payer: Medicare Other | Admitting: Surgical

## 2020-01-17 ENCOUNTER — Ambulatory Visit: Payer: Self-pay

## 2020-01-17 ENCOUNTER — Telehealth: Payer: Self-pay | Admitting: Orthopedic Surgery

## 2020-01-17 ENCOUNTER — Other Ambulatory Visit: Payer: Self-pay | Admitting: Internal Medicine

## 2020-01-17 DIAGNOSIS — M79604 Pain in right leg: Secondary | ICD-10-CM

## 2020-01-17 DIAGNOSIS — M1711 Unilateral primary osteoarthritis, right knee: Secondary | ICD-10-CM | POA: Diagnosis not present

## 2020-01-17 DIAGNOSIS — S32691A Other specified fracture of right ischium, initial encounter for closed fracture: Secondary | ICD-10-CM

## 2020-01-17 MED ORDER — POTASSIUM CHLORIDE CRYS ER 20 MEQ PO TBCR: EXTENDED_RELEASE_TABLET | ORAL | 1 refills | Status: DC

## 2020-01-17 MED ORDER — LEVOTHYROXINE SODIUM 25 MCG PO TABS
ORAL_TABLET | ORAL | 0 refills | Status: DC
Start: 2020-01-17 — End: 2020-01-20

## 2020-01-17 NOTE — Telephone Encounter (Signed)
Patient wife called. patient fell yesterday. fx hip. Need to be seen ASAP.

## 2020-01-17 NOTE — Telephone Encounter (Signed)
Patient wife [name] called stating husband needs to be seen right away. Patient fell and went to [facility] and was treated for right fractured hip. Wife was told to go to Ortho walk in clinic on [address] and doctor told patient to bring him to Dr. Marlou Sa. [name] states that the doctor at the clinic stated he could not see a fracture and to call and ask for patient to be seen some time today. Please call [name] as soon as possible about this matter. She states she don't care who he needs to see just someone today if possible. Phone number is [phone].

## 2020-01-17 NOTE — Addendum Note (Signed)
Addended by: Vicie Mutters R on: 01/17/2020 09:11 AM   Modules accepted: Orders

## 2020-01-17 NOTE — Telephone Encounter (Signed)
Appt scheduled. Coming in this afternoon to see [facility]

## 2020-01-17 NOTE — Telephone Encounter (Signed)
scheduled

## 2020-01-18 ENCOUNTER — Other Ambulatory Visit: Payer: Self-pay

## 2020-01-18 ENCOUNTER — Ambulatory Visit (HOSPITAL_BASED_OUTPATIENT_CLINIC_OR_DEPARTMENT_OTHER)
Admission: RE | Admit: 2020-01-18 | Discharge: 2020-01-18 | Disposition: A | Discharge status: Home / Self Care | Payer: Medicare Other | Source: Ambulatory Visit | Attending: Surgical | Admitting: Surgical

## 2020-01-18 DIAGNOSIS — M48061 Spinal stenosis, lumbar region without neurogenic claudication: Secondary | ICD-10-CM | POA: Insufficient documentation

## 2020-01-18 DIAGNOSIS — M79604 Pain in right leg: Secondary | ICD-10-CM | POA: Diagnosis not present

## 2020-01-18 DIAGNOSIS — R102 Pelvic and perineal pain: Secondary | ICD-10-CM | POA: Insufficient documentation

## 2020-01-18 DIAGNOSIS — I714 Abdominal aortic aneurysm, without rupture: Secondary | ICD-10-CM | POA: Diagnosis not present

## 2020-01-18 DIAGNOSIS — M5136 Other intervertebral disc degeneration, lumbar region: Secondary | ICD-10-CM | POA: Insufficient documentation

## 2020-01-20 ENCOUNTER — Telehealth: Payer: Self-pay | Admitting: Orthopedic Surgery

## 2020-01-20 ENCOUNTER — Other Ambulatory Visit: Payer: Self-pay | Admitting: Internal Medicine

## 2020-01-20 ENCOUNTER — Telehealth: Payer: Self-pay

## 2020-01-20 MED ORDER — LEVOTHYROXINE SODIUM 25 MCG PO TABS
ORAL_TABLET | ORAL | 0 refills | Status: DC
Start: 2020-01-20 — End: 2020-06-23

## 2020-01-20 NOTE — Telephone Encounter (Signed)
IC verbal given.  

## 2020-01-20 NOTE — Telephone Encounter (Signed)
Damita Dunnings from [facility] called. He would like verbal orders to start PT. His call back number is [phone]

## 2020-01-20 NOTE — Telephone Encounter (Signed)
Manual transmission received.  No additional episodes since pause episode on 12/16/19.   Spoke with pt's wife regarding pause episode.  She reports he would've been asleep at that time. She reports that he has not had any presyncopal/syncopal episodes.  Advised to call for any symptomatic episodes.  [name] verbalizes understanding and agreement with plan.  She denies further questions at this time.

## 2020-01-20 NOTE — Telephone Encounter (Signed)
Occupational therapist needs verbal order for Home Health Occupational therapy. Order: twice a week for 4 weeks to work on strength, balance and endurance to be safe in daily tasks. Per Dr. Melford Aase, OT fine but will need to contact Dr. Lorin Mercy office regarding weight bearing.  Left message on voicemail to call back.

## 2020-01-20 NOTE — Telephone Encounter (Signed)
Spoke with patient wife he was unavailable at the time. I did ask her if he can send a manual transmission from his home monitor and she states she ill in the next hour and she knows how to do it so she didn't need my help. I assured her we will be looking out for it

## 2020-01-22 ENCOUNTER — Other Ambulatory Visit: Payer: Self-pay

## 2020-01-22 ENCOUNTER — Ambulatory Visit (INDEPENDENT_AMBULATORY_CARE_PROVIDER_SITE_OTHER): Payer: Medicare Other | Admitting: Adult Health

## 2020-01-22 ENCOUNTER — Encounter: Payer: Self-pay | Admitting: Adult Health

## 2020-01-22 VITALS — BP 102/64 | HR 94 | Temp 97.5°F

## 2020-01-22 DIAGNOSIS — E1122 Type 2 diabetes mellitus with diabetic chronic kidney disease: Secondary | ICD-10-CM | POA: Diagnosis not present

## 2020-01-22 DIAGNOSIS — E876 Hypokalemia: Secondary | ICD-10-CM | POA: Diagnosis not present

## 2020-01-22 DIAGNOSIS — F4321 Adjustment disorder with depressed mood: Secondary | ICD-10-CM

## 2020-01-22 DIAGNOSIS — N183 Chronic kidney disease, stage 3 unspecified: Secondary | ICD-10-CM

## 2020-01-22 DIAGNOSIS — N419 Inflammatory disease of prostate, unspecified: Secondary | ICD-10-CM | POA: Diagnosis not present

## 2020-01-22 DIAGNOSIS — D649 Anemia, unspecified: Secondary | ICD-10-CM | POA: Diagnosis not present

## 2020-01-22 DIAGNOSIS — R350 Frequency of micturition: Secondary | ICD-10-CM | POA: Insufficient documentation

## 2020-01-22 DIAGNOSIS — N3000 Acute cystitis without hematuria: Secondary | ICD-10-CM

## 2020-01-22 MED ORDER — CIPROFLOXACIN HCL 500 MG PO TABS: 500.0000 mg | ORAL_TABLET | Freq: Two times a day (BID) | ORAL | 0 refills | Status: AC

## 2020-01-22 MED ORDER — BUPROPION HCL ER (XL) 150 MG PO TB24: 150.0000 mg | ORAL_TABLET | ORAL | 1 refills | Status: DC

## 2020-01-22 NOTE — Patient Instructions (Addendum)
Goals    . DIET - INCREASE WATER INTAKE       Bupropion sustained-release tablets (Depression/Mood Disorders) What is this medicine? BUPROPION (byoo PROE pee on) is used to treat depression. This medicine may be used for other purposes; ask your health care provider or pharmacist if you have questions. COMMON BRAND NAME(S): Budeprion SR, Wellbutrin SR What should I tell my health care provider before I take this medicine? They need to know if you have any of these conditions:  an eating disorder, such as anorexia or bulimia  bipolar disorder or psychosis  diabetes or high blood sugar, treated with medication  glaucoma  head injury or brain tumor  heart disease, previous heart attack, or irregular heart beat  high blood pressure  kidney or liver disease  seizures  suicidal thoughts or a previous suicide attempt  Tourette's syndrome  weight loss  an unusual or allergic reaction to bupropion, other medicines, foods, dyes, or preservatives  breast-feeding  pregnant or trying to become pregnant How should I use this medicine? Take this medicine by mouth with a glass of water. Follow the directions on the prescription label. You can take it with or without food. If it upsets your stomach, take it with food. Do not cut, crush or chew this medicine. Take your medicine at regular intervals. If you take this medicine more than once a day, take your second dose at least 8 hours after you take your first dose. To limit difficulty in sleeping, avoid taking this medicine at bedtime. Do not take your medicine more often than directed. Do not stop taking this medicine suddenly except upon the advice of your doctor. Stopping this medicine too quickly may cause serious side effects or your condition may worsen. A special MedGuide will be given to you by the pharmacist with each prescription and refill. Be sure to read this information carefully each time. Talk to your pediatrician  regarding the use of this medicine in children. Special care may be needed. Overdosage: If you think you have taken too much of this medicine contact a poison control center or emergency room at once. NOTE: This medicine is only for you. Do not share this medicine with others. What if I miss a dose? If you miss a dose, skip the missed dose and take your next tablet at the regular time. There should be at least 8 hours between doses. Do not take double or extra doses. What may interact with this medicine? Do not take this medicine with any of the following medications:  linezolid  MAOIs like Azilect, Carbex, Eldepryl, Marplan, Nardil, and Parnate  methylene blue (injected into a vein)  other medicines that contain bupropion like Zyban This medicine may also interact with the following medications:  alcohol  certain medicines for anxiety or sleep  certain medicines for blood pressure like metoprolol, propranolol  certain medicines for depression or psychotic disturbances  certain medicines for HIV or AIDS like efavirenz, lopinavir, nelfinavir, ritonavir  certain medicines for irregular heart beat like propafenone, flecainide  certain medicines for Parkinson's disease like amantadine, levodopa  certain medicines for seizures like carbamazepine, phenytoin, phenobarbital  cimetidine  clopidogrel  cyclophosphamide  digoxin  furazolidone  isoniazid  nicotine  orphenadrine  procarbazine  steroid medicines like prednisone or cortisone  stimulant medicines for attention disorders, weight loss, or to stay awake  tamoxifen  theophylline  thiotepa  ticlopidine  tramadol  warfarin This list may not describe all possible interactions. Give your health  care provider a list of all the medicines, herbs, non-prescription drugs, or dietary supplements you use. Also tell them if you smoke, drink alcohol, or use illegal drugs. Some items may interact with your  medicine. What should I watch for while using this medicine? Tell your doctor if your symptoms do not get better or if they get worse. Visit your doctor or healthcare provider for regular checks on your progress. Because it may take several weeks to see the full effects of this medicine, it is important to continue your treatment as prescribed by your doctor. This medicine may cause serious skin reactions. They can happen weeks to months after starting the medicine. Contact your healthcare provider right away if you notice fevers or flu-like symptoms with a rash. The rash may be red or purple and then turn into blisters or peeling of the skin. Or, you might notice a red rash with swelling of the face, lips or lymph nodes in your neck or under your arms. Patients and their families should watch out for new or worsening thoughts of suicide or depression. Also watch out for sudden changes in feelings such as feeling anxious, agitated, panicky, irritable, hostile, aggressive, impulsive, severely restless, overly excited and hyperactive, or not being able to sleep. If this happens, especially at the beginning of treatment or after a change in dose, call your healthcare provider. Avoid alcoholic drinks while taking this medicine. Drinking excessive alcoholic beverages, using sleeping or anxiety medicines, or quickly stopping the use of these agents while taking this medicine may increase your risk for a seizure. Do not drive or use heavy machinery until you know how this medicine affects you. This medicine can impair your ability to perform these tasks. Do not take this medicine close to bedtime. It may prevent you from sleeping. Your mouth may get dry. Chewing sugarless gum or sucking hard candy, and drinking plenty of water may help. Contact your doctor if the problem does not go away or is severe. What side effects may I notice from receiving this medicine? Side effects that you should report to your doctor  or health care professional as soon as possible:  allergic reactions like skin rash, itching or hives, swelling of the face, lips, or tongue  breathing problems  changes in vision  confusion  elevated mood, decreased need for sleep, racing thoughts, impulsive behavior  fast or irregular heartbeat  hallucinations, loss of contact with reality  increased blood pressure  rash, fever, and swollen lymph nodes  redness, blistering, peeling or loosening of the skin, including inside the mouth  seizures  suicidal thoughts or other mood changes  unusually weak or tired  vomiting Side effects that usually do not require medical attention (report to your doctor or health care professional if they continue or are bothersome):  constipation  headache  loss of appetite  nausea  tremors  weight loss This list may not describe all possible side effects. Call your doctor for medical advice about side effects. You may report side effects to FDA at 1-800-FDA-1088. Where should I keep my medicine? Keep out of the reach of children. Store at room temperature between 20 and 25 degrees C (68 and 77 degrees F), away from direct sunlight and moisture. Keep tightly closed. Throw away any unused medicine after the expiration date. NOTE: This sheet is a summary. It may not cover all possible information. If you have questions about this medicine, talk to your doctor, pharmacist, or health care provider.  2020  Elsevier/Gold Standard (2018-07-12 13:55:14)

## 2020-01-22 NOTE — Progress Notes (Signed)
Assessment and Plan:  [name] was seen today for follow-up.  Diagnoses and all orders for this visit:  Dysuria/urinary frequency UA was negative but with recent hospitalization with cath, MSSA bacteremia  Dysuria did resolve with cipro; discussed and will extend for full 4 week course to cover for prostatitis due to risk factors and hx He continues to report frequent urination/nocturia - however this began prior to recent admission, question of incomplete bladder emptying and had been referred to urology, rescheduled appointment due to admission and has upcoming on 10/15. Denies other obstructive sx -  -     CBC with Differential/Platelet -     Urinalysis, Routine w reflex microscopic -     ciprofloxacin (CIPRO) 500 MG tablet; Take 1 tablet (500 mg total) by mouth 2 (two) times daily for 14 days.  Hypokalemia Taking Kdur 40 mEq daily - -     COMPLETE METABOLIC PANEL WITH GFR -     Magnesium  Anemia, unspecified type -     CBC with Differential/Platelet  Situational depression Start new medication as prescribed; possible benefit with wellbutrin for mood and energy Stress management techniques discussed, increase water, good sleep hygiene discussed- discussed sleep meds but deferred as sleep interrupted by nocturia - has urology evaluation upcoming Follow up 6-8 weeks, call the office if any new AE's from medications and we will switch them -     buPROPion (WELLBUTRIN XL) 150 MG 24 hr tablet; Take 1 tablet (150 mg total) by mouth every morning.  Further disposition pending results of labs. Discussed med's effects and SE's.   Over 30 minutes of exam, counseling, chart review, and critical decision making was performed.   Future Appointments  Date Time Provider [place]  01/27/2020 11:05 AM [facility] DEVICE REMOTES [facility]  01/31/2020  9:45 AM [name], MD [facility] None  02/12/2020  4:20 PM Lelon Perla, MD [facility]  03/02/2020 11:05 AM  [facility] DEVICE REMOTES [facility]  03/05/2020 11:00 AM [name], MD [facility] None  04/06/2020 11:05 AM [facility] DEVICE REMOTES [facility]  05/11/2020 11:05 AM [facility] DEVICE REMOTES [facility]  06/01/2020  3:00 PM Venetta Knee, Caryl Pina, NP [facility] None    ------------------------------------------------------------------------------------------------------------------   HPI BP 102/64   Pulse 94   Temp (!) 97.5 F (36.4 C)   SpO2 97%   86 y.o.male with hx of T2DM, prostatitis with recent admission/extended [facility] stay following fall with fracture presents for 1 week follow up after hospital follow up appointment due to need for lab-  potassium recheck.   Admitted 11/16/2019 after fall with R introch femur fracture, underwent IM nail by Dr. Marlou Sa, hospital course became complicated by postop anemia requiring 1 unit of packed red blood cells, metabolic encephalopathy with MRI showing an acute CVA, for which neurology was consulted. EEG was overall unremarkable. He was started on pravastatin and bASA. He then developed elevated troponins thought likely to demand ischemia, echo showed preserved EF with grade 2 diastolic heart failure. Due to his patient encephalopathy his work-up was besides a stroke negative, his Lyrica was taper off, he ended up growing MSSA bacteremia and started on Ancef, his repeated blood cultures remained negative. At hospital follow up appointment on 01/15/2020 he reported new dysuria [place] [facility] [place] he also had urinary frequency. UA did not show clear UTI but was initiated empirically cipro for concern of possible prostatitis, reports dysuria has resolved since starting. Continues to have urinary frequency, no urgency, no urine character  changes, but apparently this precedes admission, has upcoming initial urology evaluation in Oct 15 th with [facility] due to suspicion of incomplete bladder  emptying.   Notably did have recent CT pelvis w/o contrast on 01/18/2020 for hip follow up, no abnormality of bladder/prostate was noted  At last visit anemia was improved;  Lab Results  Component Value Date   WBC 8.5 01/15/2020   HGB 12.1 (L) 01/15/2020   HCT 37.6 (L) 01/15/2020   MCV 97.7 01/15/2020   PLT 232 01/15/2020   Potassium was low; started on kdur 60 mEq x 3 days, then 40 mEq daily;  Lab Results  Component Value Date   NA 143 01/15/2020   K 2.9 (L) 01/15/2020   CL 99 01/15/2020   CO2 34 (H) 01/15/2020   GLUCOSE 123 (H) 01/15/2020   BUN 15 01/15/2020   CREATININE 1.21 (H) 01/15/2020   CALCIUM 9.0 01/15/2020   GFRAA 63 01/15/2020   GFRNONAA 55 (L) 01/15/2020   He does appear very depressed and solemn today; initially declines discussion or meds, pain and interrupted sleep have been difficult, admits very fatigued, poor energy and motivation. His wife reports he has tried cymbalta, amitryptiline in the past. After discussion is interested in trial of Wellbutrin.   Past Medical History:  Diagnosis Date  . AAA (abdominal aortic aneurysm) ([place])   . 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 CAD (coronary artery disease)    a. s/p CABGx4 in 07/2015.  . Carotid artery disease ([place])   . Colon polyps   . Diabetes ([place])   . Difficult intubation   . 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 Esophageal reflux   . High cholesterol   . History of IBS 02/27/2009  . Hypertension    pt denies, he says he has a h/o hypotension. If BP up he adjusts the Florinef  . Jejunitis with partial SBO 05/20/2017  . Orthostatic hypotension   . Prostatitis   . Stroke ([place])   . TIA (transient ischemic attack)   . Upper GI bleed 06/24/2018  . Vitamin B 12 deficiency   . Vitamin D deficiency      Allergies  Allergen Reactions  . Other Other (See Comments)    Pt reports allergic to a medication but unsure of name or type of med  . Cymbalta [Duloxetine Hcl] Other (See Comments)    Dizziness,  hallucinations.   . Keflex [Cephalexin] Other (See Comments)    dizziness  . Simvastatin Other (See Comments)    Joint pain  . Sudafed [Pseudoephedrine] Other (See Comments)    Dizziness    Current Outpatient Medications on File Prior to Visit  Medication Sig  . acetaminophen (TYLENOL) 500 MG tablet Take 1,000 mg by mouth in the morning and at bedtime.   Marland Kitchen amLODipine (NORVASC) 5 MG tablet Take 1 tablet Daily for BP  . aspirin EC 81 MG tablet Take 2 tablets (162 mg total) by mouth daily.  . Calcium Carb-Cholecalciferol (CALCIUM+D3 PO) Take 1 tablet by mouth daily.  . Cholecalciferol (VITAMIN D-3) 5000 units TABS Take 5,000 Units by mouth daily.  . ferrous sulfate 325 (65 FE) MG tablet Take 325 mg by mouth daily with breakfast.  . fludrocortisone (FLORINEF) 0.1 MG tablet Take 2 tablets Daily for Low BP (Patient taking differently: Take 0.1 mg by mouth daily. Daily for Low BP)  . glucose blood test strip Check blood sugar 1 time daily-DX-E11.9 (Patient taking differently: 1 each by Other route as needed.  Check blood sugar 1 time daily-DX-E11.9)  . levothyroxine (SYNTHROID) 25 MCG tablet Take 1 tablet     Daily      on an empty stomach with only water for 30 minutes & no Antacid meds, Calcium or Magnesium for 4 hours & avoid Biotin  . Multiple Vitamin (MULTIVITAMIN WITH MINERALS) TABS tablet Take 1 tablet by mouth daily.  . Omega-3 Fatty Acids (FISH OIL) 600 MG CAPS Take 1 capsule by mouth daily.  . pantoprazole (PROTONIX) 40 MG tablet Take 1 tablet Daily for Indigestion & Heartburn (Patient taking differently: Take 40 mg by mouth daily. )  . polyethylene glycol (MIRALAX / GLYCOLAX) packet Take 17 g by mouth daily. (Patient taking differently: Take 17 g by mouth 2 (two) times daily. )  . potassium chloride SA (KLOR-CON) 20 MEQ tablet Take 3 x a day for 3 days, then go to twice a day for low potassium  . pravastatin (PRAVACHOL) 80 MG tablet Take 80 mg by mouth daily.  . Zinc 50 MG TABS Take 1  tablet by mouth daily.   No current facility-administered medications on file prior to visit.    ROS: all negative except above.   Physical Exam:  BP 102/64   Pulse 94   Temp (!) 97.5 F (36.4 C)   SpO2 97%   General Appearance: Well nourished, well dressed elderly male in no apparent distress. Eyes: PERRLA, EOMs, conjunctiva no swelling or erythema Sinuses: No Frontal/maxillary tenderness ENT/Mouth: Ext aud canals clear, TMs without erythema, bulging. No erythema, swelling, or exudate on post pharynx.  Tonsils not swollen or erythematous. Hearing normal.  Neck: Supple, thyroid normal.  Respiratory: Respiratory effort normal, BS equal bilaterally without rales, rhonchi, wheezing or stridor.  Cardio: RRR with no MRGs. Brisk peripheral pulses without edema.  Abdomen: Soft, + BS.  Non tender, bladder not palpably distended, no guarding, rebound, hernias, masses. Lymphatics: Non tender without lymphadenopathy.  Musculoskeletal: Limited exam in wheelchair with fractures;  Skin: Warm, dry without rashes, lesions, ecchymosis.  Neuro: Normal muscle tone, distal Sensation intact.  Psych: Awake and oriented X 3, depressed affect, Insight and Judgment appropriate.     Izora Ribas, NP 5:36 PM [facility]

## 2020-01-23 ENCOUNTER — Other Ambulatory Visit: Payer: Self-pay | Admitting: Adult Health

## 2020-01-23 DIAGNOSIS — E875 Hyperkalemia: Secondary | ICD-10-CM

## 2020-01-23 LAB — COMPLETE METABOLIC PANEL WITH GFR
AG Ratio: 1.3 (calc) (ref 1.0–2.5)
ALT: 9 U/L (ref 9–46)
AST: 16 U/L (ref 10–35)
Albumin: 3.9 g/dL (ref 3.6–5.1)
Alkaline phosphatase (APISO): 107 U/L (ref 35–144)
BUN/Creatinine Ratio: 15 (calc) (ref 6–22)
BUN: 22 mg/dL (ref 7–25)
CO2: 28 mmol/L (ref 20–32)
Calcium: 9.8 mg/dL (ref 8.6–10.3)
Chloride: 103 mmol/L (ref 98–110)
Creat: 1.46 mg/dL — ABNORMAL HIGH (ref 0.70–1.11)
GFR, Est African American: 50 mL/min/{1.73_m2} — ABNORMAL LOW (ref >=60)
GFR, Est Non African American: 44 mL/min/{1.73_m2} — ABNORMAL LOW (ref >=60)
Globulin: 2.9 g/dL (calc) (ref 1.9–3.7)
Glucose, Bld: 138 mg/dL — ABNORMAL HIGH (ref 65–99)
Potassium: 5.9 mmol/L — ABNORMAL HIGH (ref 3.5–5.3)
Sodium: 139 mmol/L (ref 135–146)
Total Bilirubin: 0.4 mg/dL (ref 0.2–1.2)
Total Protein: 6.8 g/dL (ref 6.1–8.1)

## 2020-01-23 LAB — CBC WITH DIFFERENTIAL/PLATELET
Absolute Monocytes: 576 cells/uL (ref 200–950)
Basophils Absolute: 48 cells/uL (ref 0–200)
Basophils Relative: 0.5 %
Eosinophils Absolute: 326 cells/uL (ref 15–500)
Eosinophils Relative: 3.4 %
HCT: 39.9 % (ref 38.5–50.0)
Hemoglobin: 13.1 g/dL — ABNORMAL LOW (ref 13.2–17.1)
Lymphs Abs: 3389 cells/uL (ref 850–3900)
MCH: 31.9 pg (ref 27.0–33.0)
MCHC: 32.8 g/dL (ref 32.0–36.0)
MCV: 97.1 fL (ref 80.0–100.0)
MPV: 9.6 fL (ref 7.5–12.5)
Monocytes Relative: 6 %
Neutro Abs: 5261 cells/uL (ref 1500–7800)
Neutrophils Relative %: 54.8 %
Platelets: 258 10*3/uL (ref 140–400)
RBC: 4.11 10*6/uL — ABNORMAL LOW (ref 4.20–5.80)
RDW: 16.3 % — ABNORMAL HIGH (ref 11.0–15.0)
Total Lymphocyte: 35.3 %
WBC: 9.6 10*3/uL (ref 3.8–10.8)

## 2020-01-23 LAB — URINALYSIS, ROUTINE W REFLEX MICROSCOPIC
Bacteria, UA: NONE SEEN /HPF
Bilirubin Urine: NEGATIVE
Glucose, UA: NEGATIVE
Hgb urine dipstick: NEGATIVE
Hyaline Cast: NONE SEEN /LPF
Ketones, ur: NEGATIVE
Nitrite: NEGATIVE
Protein, ur: NEGATIVE
Specific Gravity, Urine: 1.019 (ref 1.001–1.03)
Squamous Epithelial / HPF: NONE SEEN /HPF (ref <=5)
pH: 6.5 (ref 5.0–8.0)

## 2020-01-23 LAB — MAGNESIUM: Magnesium: 2.3 mg/dL (ref 1.5–2.5)

## 2020-01-26 ENCOUNTER — Encounter: Payer: Self-pay | Admitting: Surgical

## 2020-01-26 DIAGNOSIS — M1711 Unilateral primary osteoarthritis, right knee: Secondary | ICD-10-CM | POA: Diagnosis not present

## 2020-01-26 MED ORDER — BUPIVACAINE HCL 0.25 % IJ SOLN
4.0000 mL | INTRAMUSCULAR | Status: AC | PRN
  Administered 2020-01-26: 4 mL via INTRA_ARTICULAR

## 2020-01-26 MED ORDER — METHYLPREDNISOLONE ACETATE 40 MG/ML IJ SUSP
40.0000 mg | INTRAMUSCULAR | Status: AC | PRN
  Administered 2020-01-26: 40 mg via INTRA_ARTICULAR

## 2020-01-26 MED ORDER — LIDOCAINE HCL 1 % IJ SOLN
5.0000 mL | INTRAMUSCULAR | Status: AC | PRN
  Administered 2020-01-26: 5 mL

## 2020-01-26 NOTE — Progress Notes (Signed)
Post-Op Visit Note   Patient: [name]           Date of Birth: [birth year]           MRN: [id number] Visit Date: 01/17/2020 PCP: [name], MD   Assessment & Plan:  Chief Complaint:  Chief Complaint  Patient presents with  . Right Hip - Pain   Visit Diagnoses:  1. Other closed fracture of right ischium, initial encounter ([place])   2. Pain in right leg   3. Unilateral primary osteoarthritis, right knee     Plan: Patient is an 86 year old male who presents s/p right hip IM nail on 11/16/2019.  Patient had a fall on 01/16/2020.  He notes significant groin pain since the fall.  He fell onto his right lateral hip.  He notes pain is rated 4-5/10.  He woke with right groin pain last night.  He has been able to weight-bear with a walker but this causes significant pain.  He is currently using a wheelchair to get into the office.  He was seen at urgent care and had radiographs that were taken and these radiographs were reviewed via a disc.  Radiographs from after the fall were compared with the previous radiographs taken in this office.  No significant differences aside from some increased displacement of the lesser trochanteric fracture fragment.  No significant displacement or fracture line noted at the former fracture site.  No fracture of the hardware or any complicating features.  On exam patient has some mild to moderate pain with right hip internal rotation/external rotation.  Incisions are healing well.  Positive Stinchfield exam.  He also notes right knee and proximal tibial pain.  He has some tenderness without effusion of the right knee.  Radiographs are negative for any acute injury to the knee or tib-fib region.  With his history of recent fracture fixation and his pain, plan to order urgent CT scan for evaluation of the right hip.  CT scan was found to be negative for any acute fracture.  He was requested to be nonweightbearing up until the CT scan but after the CT scan, he  will be weightbearing as tolerated with a walker.  Patient requests knee injection for his right knee arthritis.  Knee injection administered and patient tolerated the procedure well.     Large Joint Inj: R knee on 01/26/2020 6:34 PM Indications: diagnostic evaluation, joint swelling and pain Details: 18 G 1.5 in needle, superolateral approach  Arthrogram: No  Medications: 5 mL lidocaine 1 %; 40 mg methylPREDNISolone acetate 40 MG/ML; 4 mL bupivacaine 0.25 % Outcome: tolerated well, no immediate complications Procedure, treatment alternatives, risks and benefits explained, specific risks discussed. Consent was given by the patient. Immediately prior to procedure a time out was called to verify the correct patient, procedure, equipment, support staff and site/side marked as required. Patient was prepped and draped in the usual sterile fashion.        Follow-Up Instructions: No follow-ups on file.   Orders:  Orders Placed This Encounter  Procedures  . XR Knee 1-2 Views Right  . XR Tibia/Fibula Right  . CT PELVIS WO CONTRAST   No orders of the defined types were placed in this encounter.   Imaging: No results found.  PMFS History: Patient Active Problem List   Diagnosis Date Noted  . Situational depression 01/22/2020  . Urinary frequency 01/22/2020  . Bacteremia due to Staphylococcus epidermidis 11/24/2019  . Stroke (cerebrum) ([place])   . Elevated  troponin   . Closed right hip fracture ([place]) 11/15/2019  . Unilateral primary osteoarthritis, right knee 11/13/2019  . At high risk for falls 05/29/2019  . Chronic bilateral low back pain without sciatica 02/07/2019  . Statin intolerance 11/16/2018  . Hyperlipidemia, mixed 08/15/2018  . Low back pain 07/18/2018  . History of lacunar cerebrovascular accident (CVA) 02/12/2018  . Dizziness 02/12/2018  . Myofascial pain 12/18/2017  . Spondylosis without myelopathy or radiculopathy, lumbar region 12/18/2017  . Lumbar radiculopathy,  right 12/18/2017  . Cholelithiasis 05/17/2017  . Sinus Bradycardia 05/17/2017  . Thoracic radiculopathy 04/12/2017  . Primary osteoarthritis of right knee 12/14/2016  . DDD (degenerative disc disease), lumbar 10/10/2016  . Lumbar facet arthropathy 08/29/2016  . Idiopathic scoliosis 03/22/2016  . Diabetic neuropathy ([place]) 03/22/2016  . Dysautonomia orthostatic hypotension syndrome ([place]) 02/25/2016  . CKD stage 3 due to type 2 diabetes mellitus ([place]) 02/19/2016  . Coronary artery disease involving coronary bypass graft of native heart without angina pectoris   . Diabetes mellitus type 2, diet-controlled ([place])   . 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 Medication management 08/11/2015  . 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 PVD (peripheral vascular disease) ([place]) 01/01/2014  . Hyperlipidemia associated with type 2 diabetes mellitus ([place]) 04/11/2013  . Supine hypertension   . Vitamin D deficiency   . Vitamin B 12 deficiency   . Coronary atherosclerosis- s/p PCI to LAD in 2009 and PCI to RCA in 2011 02/27/2009  . Abdominal aortic aneurysm ([place]) 02/27/2009  . GERD 02/27/2009   Past Medical History:  Diagnosis Date  . AAA (abdominal aortic aneurysm) ([place])   . 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 CAD (coronary artery disease)    a. s/p CABGx4 in 07/2015.  . Carotid artery disease ([place])   . Colon polyps   . Diabetes ([place])   . Difficult intubation   . 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 Esophageal reflux   . High cholesterol   . History of IBS 02/27/2009  . Hypertension    pt denies, he says he has a h/o hypotension. If BP up he adjusts the Florinef  . Jejunitis with partial SBO 05/20/2017  . Orthostatic hypotension   . Prostatitis   . Stroke ([place])   . TIA (transient ischemic attack)   . Upper GI bleed 06/24/2018  . Vitamin B 12 deficiency   . Vitamin D deficiency     Family History  Problem Relation Age of Onset  . Heart attack Father        died age 32  . Heart attack Brother        died  age 28  . Anuerysm Brother        aortic  . Heart attack Sister        died age 3  . Colon cancer Sister   . Liver cancer Sister   . Diabetes Maternal Grandmother   . Arthritis Mother   . Dementia Mother   . Heart Problems Brother   . Heart Problems Brother   . Heart Problems Brother   . Heart Problems Brother   . Healthy Daughter   . Healthy Son   . Esophageal cancer Neg Hx   . Inflammatory bowel disease Neg Hx   . Pancreatic cancer Neg Hx   . Stomach cancer Neg Hx     Past Surgical History:  Procedure Laterality Date  . BIOPSY  06/25/2018   Procedure: BIOPSY;  Surgeon: Rush Landmark Telford Nab., MD;  [facility];  Service: Gastroenterology;;  . BIOPSY  04/24/2019   Procedure: BIOPSY;  Surgeon: Irving Copas., MD;  Location: [facility];  Service: Gastroenterology;;  . BLADDER SURGERY  1969   traumatic pelvic fractures, urethral and bladder repair  . CARDIAC CATHETERIZATION N/A 07/01/2015   Procedure: Left Heart Cath and Coronary Angiography;  Surgeon: Wellington Hampshire, MD;  Location: [place] [facility];  Service: Cardiovascular;  Laterality: N/A;  . COLON RESECTION N/A 05/17/2017   Procedure: DIAGNOSTIC LAPAROSCOPY,;  Surgeon: Leighton Ruff, MD;  Location: [facility];  Service: General;  Laterality: N/A;  . COLONOSCOPY WITH PROPOFOL N/A 04/24/2019   Procedure: COLONOSCOPY WITH PROPOFOL;  Surgeon: Irving Copas., MD;  [facility];  Service: Gastroenterology;  Laterality: N/A;  . CORONARY ANGIOPLASTY WITH STENT PLACEMENT    . CORONARY ARTERY BYPASS GRAFT N/A 07/06/2015   Procedure: CORONARY ARTERY BYPASS GRAFTING (CABG)x 4   utilizing the left internal mammary artery and endoscopically harvested bilateral  sapheneous vein.;  Surgeon: Ivin Poot, MD;  Location: [place];  Service: Open Heart Surgery;  Laterality: N/A;  . ESOPHAGEAL DILATION  04/24/2019   Procedure: ESOPHAGEAL DILATION;  Surgeon: Rush Landmark Telford Nab., MD;  [facility];  Service: Gastroenterology;;  . ESOPHAGOGASTRODUODENOSCOPY (EGD) WITH PROPOFOL N/A 06/25/2018   Procedure: ESOPHAGOGASTRODUODENOSCOPY (EGD) WITH PROPOFOL;  Surgeon: Irving Copas., MD;  Location: [place];  Service: Gastroenterology;  Laterality: N/A;  . ESOPHAGOGASTRODUODENOSCOPY (EGD) WITH PROPOFOL N/A 04/24/2019   Procedure: ESOPHAGOGASTRODUODENOSCOPY (EGD) WITH PROPOFOL;  Surgeon: Rush Landmark Telford Nab., MD;  [facility];  Service: Gastroenterology;  Laterality: N/A;  . FEMUR IM NAIL Right 11/16/2019   Procedure: RIGHT HIP INTRAMEDULLARY (IM) NAIL FEMORAL;  Surgeon: Meredith Pel, MD;  Location: [place];  Service: Orthopedics;  Laterality: Right;  . HEMOSTASIS CLIP PLACEMENT  04/24/2019   Procedure: HEMOSTASIS CLIP PLACEMENT;  Surgeon: Irving Copas., MD;  [facility];  Service: Gastroenterology;;  . KNEE SURGERY    . LOOP RECORDER INSERTION N/A 02/19/2018   Procedure: LOOP RECORDER INSERTION;  Surgeon: Deboraha Sprang, MD;  Location: [place] [facility];  Service: Cardiovascular;  Laterality: N/A;  . POLYPECTOMY  04/24/2019   Procedure: POLYPECTOMY;  Surgeon: Rush Landmark Telford Nab., MD;  Location: [facility];  Service: Gastroenterology;;  . Lia Foyer LIFTING INJECTION  04/24/2019   Procedure: SUBMUCOSAL LIFTING INJECTION;  Surgeon: Irving Copas., MD;  [facility];  Service: Gastroenterology;;  . TEE WITHOUT CARDIOVERSION N/A 07/06/2015   Procedure: TRANSESOPHAGEAL ECHOCARDIOGRAM (TEE);  Surgeon: Ivin Poot, MD;  Location: [place];  Service: Open Heart Surgery;  Laterality: N/A;   Social History   Occupational History  . Occupation: Retired  Tobacco Use  . Smoking status: Former Smoker    Quit date: 07/16/1963    Years since quitting: 56.5  . Smokeless tobacco: Former Systems developer    Types: [place] date: 1989  Media planner  . Vaping Use: Never used  Substance and Sexual Activity  . Alcohol use: No  .  Drug use: No  . Sexual activity: Not Currently

## 2020-01-27 ENCOUNTER — Ambulatory Visit (INDEPENDENT_AMBULATORY_CARE_PROVIDER_SITE_OTHER): Payer: Medicare Other | Admitting: *Deleted

## 2020-01-27 ENCOUNTER — Other Ambulatory Visit: Payer: Self-pay

## 2020-01-27 DIAGNOSIS — E875 Hyperkalemia: Secondary | ICD-10-CM | POA: Diagnosis not present

## 2020-01-27 LAB — BASIC METABOLIC PANEL WITH GFR
BUN/Creatinine Ratio: 14 (calc) (ref 6–22)
BUN: 26 mg/dL — ABNORMAL HIGH (ref 7–25)
CO2: 29 mmol/L (ref 20–32)
Calcium: 9.4 mg/dL (ref 8.6–10.3)
Chloride: 102 mmol/L (ref 98–110)
Creat: 1.8 mg/dL — ABNORMAL HIGH (ref 0.70–1.11)
GFR, Est African American: 39 mL/min/{1.73_m2} — ABNORMAL LOW (ref >=60)
GFR, Est Non African American: 34 mL/min/{1.73_m2} — ABNORMAL LOW (ref >=60)
Glucose, Bld: 93 mg/dL (ref 65–99)
Potassium: 4.5 mmol/L (ref 3.5–5.3)
Sodium: 140 mmol/L (ref 135–146)

## 2020-01-27 NOTE — Progress Notes (Signed)
Patient is here for a NV and labs. Patient states he has increased is fluid intake and has held his potassium since his last lab work on 01/22/2020.

## 2020-01-28 ENCOUNTER — Other Ambulatory Visit: Payer: Self-pay | Admitting: Adult Health

## 2020-01-28 DIAGNOSIS — N289 Disorder of kidney and ureter, unspecified: Secondary | ICD-10-CM

## 2020-01-30 ENCOUNTER — Other Ambulatory Visit: Payer: Self-pay | Admitting: Internal Medicine

## 2020-01-30 ENCOUNTER — Encounter: Payer: Self-pay | Admitting: *Deleted

## 2020-01-30 ENCOUNTER — Ambulatory Visit (INDEPENDENT_AMBULATORY_CARE_PROVIDER_SITE_OTHER): Payer: Medicare Other | Admitting: *Deleted

## 2020-01-30 ENCOUNTER — Other Ambulatory Visit: Payer: Self-pay

## 2020-01-30 DIAGNOSIS — N289 Disorder of kidney and ureter, unspecified: Secondary | ICD-10-CM | POA: Diagnosis not present

## 2020-01-30 LAB — URINALYSIS W MICROSCOPIC + REFLEX CULTURE
Bacteria, UA: NONE SEEN /HPF
Bilirubin Urine: NEGATIVE
Glucose, UA: NEGATIVE
Hgb urine dipstick: NEGATIVE
Hyaline Cast: NONE SEEN /LPF
Ketones, ur: NEGATIVE
Leukocyte Esterase: NEGATIVE
Nitrites, Initial: NEGATIVE
Protein, ur: NEGATIVE
RBC / HPF: NONE SEEN /HPF (ref 0–2)
Specific Gravity, Urine: 1.011 (ref 1.001–1.03)
Squamous Epithelial / HPF: NONE SEEN /HPF (ref <=5)
WBC, UA: NONE SEEN /HPF (ref 0–5)
pH: 6.5 (ref 5.0–8.0)

## 2020-01-30 LAB — BASIC METABOLIC PANEL WITH GFR
BUN/Creatinine Ratio: 14 (calc) (ref 6–22)
BUN: 22 mg/dL (ref 7–25)
CO2: 29 mmol/L (ref 20–32)
Calcium: 9.5 mg/dL (ref 8.6–10.3)
Chloride: 102 mmol/L (ref 98–110)
Creat: 1.58 mg/dL — ABNORMAL HIGH (ref 0.70–1.11)
GFR, Est African American: 46 mL/min/{1.73_m2} — ABNORMAL LOW (ref >=60)
GFR, Est Non African American: 40 mL/min/{1.73_m2} — ABNORMAL LOW (ref >=60)
Glucose, Bld: 129 mg/dL — ABNORMAL HIGH (ref 65–99)
Potassium: 5 mmol/L (ref 3.5–5.3)
Sodium: 139 mmol/L (ref 135–146)

## 2020-01-30 LAB — NO CULTURE INDICATED

## 2020-01-30 MED ORDER — TAMSULOSIN HCL 0.4 MG PO CAPS: ORAL_CAPSULE | ORAL | 0 refills | Status: DC

## 2020-01-30 MED ORDER — FLUCONAZOLE 150 MG PO TABS: ORAL_TABLET | ORAL | 0 refills | Status: DC

## 2020-01-30 NOTE — Progress Notes (Signed)
Patient here for a stat BMET and a urine reflex and culture. Patient and spouse state he is having burning when he urinates, even though he is taking Cipro 500 mg twice a day, which was started 01/22/2020. He is not taking Potassium  or NSAIDS. He is forcing fluids and has to urinate frequently.

## 2020-01-31 ENCOUNTER — Ambulatory Visit (INDEPENDENT_AMBULATORY_CARE_PROVIDER_SITE_OTHER): Payer: Medicare Other | Admitting: Specialist

## 2020-01-31 ENCOUNTER — Encounter: Payer: Self-pay | Admitting: Specialist

## 2020-01-31 VITALS — BP 116/75 | HR 90 | Ht 72.0 in | Wt 180.0 lb

## 2020-01-31 DIAGNOSIS — M21371 Foot drop, right foot: Secondary | ICD-10-CM | POA: Diagnosis not present

## 2020-01-31 DIAGNOSIS — S32591A Other specified fracture of right pubis, initial encounter for closed fracture: Secondary | ICD-10-CM

## 2020-01-31 DIAGNOSIS — M48061 Spinal stenosis, lumbar region without neurogenic claudication: Secondary | ICD-10-CM

## 2020-01-31 DIAGNOSIS — I6381 Other cerebral infarction due to occlusion or stenosis of small artery: Secondary | ICD-10-CM | POA: Diagnosis not present

## 2020-01-31 DIAGNOSIS — I69359 Hemiplegia and hemiparesis following cerebral infarction affecting unspecified side: Secondary | ICD-10-CM

## 2020-01-31 NOTE — Patient Instructions (Signed)
Avoid frequent bending and stooping  No lifting greater than 10 lbs. May use ice or moist heat for pain. Weight loss is of benefit. Home health to work on preventing right heel cord contracture and strengthening and gait training.  Exercise is important to improve your indurance and does allow people to function better inspite of back pain. Ankle foot orthosis right foot drop.

## 2020-01-31 NOTE — Progress Notes (Signed)
Office Visit Note   Patient: [name]           Date of Birth: May 29, 1933           MRN: [id number] Visit Date: 01/31/2020              Requested by: [name], [address] [place],  [place] [id number] PCP: [name], MD   Assessment & Plan: Visit Diagnoses:  1. Basal ganglia stroke (HCC)   2. Closed fracture of right inferior pubic ramus, initial encounter ([place])   3. Spinal stenosis of lumbar region without neurogenic claudication   4. Foot drop, right     Plan: Avoid frequent bending and stooping  No lifting greater than 10 lbs. May use ice or moist heat for pain. Weight loss is of benefit. Home health to work on preventing right heel cord contracture and strengthening and gait training.  Exercise is important to improve your indurance and does allow people to function better inspite of back pain. Ankle foot orthosis right foot drop.  Follow-Up Instructions: Return in about 6 weeks (around 03/13/2020).   Orders:  Orders Placed This Encounter  Procedures  . Ambulatory referral to Home Health  . PT orthosis to lower extremity   No orders of the defined types were placed in this encounter.     Procedures: No procedures performed   Clinical Data: No additional findings.   Subjective: Chief Complaint  Patient presents with  . Right Hip - Follow-up  . Right Knee - Follow-up    86 year old male with history of lumbar spinal stenosis and severe right knee osteoarthritis. He was to have a right TKR but fell in July sustaining a right femur fracture. He had a right femur IM nail by Dr. Ninfa Linden 11/16/2019. He has fallen again since the nail placement and  Dr. Marlou Sa is still considering right TKR.  No refracture with the more recent fall.    Review of Systems   Objective: Vital Signs: BP 116/75   Pulse 90   Ht 6' (1.829 m)   Wt 180 lb (81.6 kg)   BMI 24.41 kg/m   Physical Exam  Ortho Exam  Specialty Comments:    No specialty comments available.  Imaging: No results found.   PMFS History: Patient Active Problem List   Diagnosis Date Noted  . Situational depression 01/22/2020  . Urinary frequency 01/22/2020  . Bacteremia due to Staphylococcus epidermidis 11/24/2019  . Stroke (cerebrum) ([place])   . Elevated troponin   . Closed right hip fracture ([place]) 11/15/2019  . Unilateral primary osteoarthritis, right knee 11/13/2019  . At high risk for falls 05/29/2019  . Chronic bilateral low back pain without sciatica 02/07/2019  . Statin intolerance 11/16/2018  . Hyperlipidemia, mixed 08/15/2018  . Low back pain 07/18/2018  . History of lacunar cerebrovascular accident (CVA) 02/12/2018  . Dizziness 02/12/2018  . Myofascial pain 12/18/2017  . Spondylosis without myelopathy or radiculopathy, lumbar region 12/18/2017  . Lumbar radiculopathy, right 12/18/2017  . Cholelithiasis 05/17/2017  . Sinus Bradycardia 05/17/2017  . Thoracic radiculopathy 04/12/2017  . Primary osteoarthritis of right knee 12/14/2016  . DDD (degenerative disc disease), lumbar 10/10/2016  . Lumbar facet arthropathy 08/29/2016  . Idiopathic scoliosis 03/22/2016  . Diabetic neuropathy ([place]) 03/22/2016  . Dysautonomia orthostatic hypotension syndrome 02/25/2016  . CKD stage 3 due to type 2 diabetes mellitus ([place]) 02/19/2016  . Coronary artery disease involving coronary bypass graft of native heart without angina  pectoris   . Diabetes mellitus type 2, diet-controlled ([place])   . 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 Medication management 08/11/2015  . 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 PVD (peripheral vascular disease) ([place]) 01/01/2014  . Hyperlipidemia associated with type 2 diabetes mellitus ([place]) 04/11/2013  . Supine hypertension   . Vitamin D deficiency   . Vitamin B 12 deficiency   . Coronary atherosclerosis- s/p PCI to LAD in 2009 and PCI to RCA in 2011 02/27/2009  . Abdominal aortic aneurysm ([place]) 02/27/2009  . GERD 02/27/2009    Past Medical History:  Diagnosis Date  . AAA (abdominal aortic aneurysm) ([place])   . 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 CAD (coronary artery disease)    a. s/p CABGx4 in 07/2015.  . Carotid artery disease ([place])   . Colon polyps   . Diabetes ([place])   . Difficult intubation   . 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 Esophageal reflux   . High cholesterol   . History of IBS 02/27/2009  . Hypertension    pt denies, he says he has a h/o hypotension. If BP up he adjusts the Florinef  . Jejunitis with partial SBO 05/20/2017  . Orthostatic hypotension   . Prostatitis   . Stroke ([place])   . TIA (transient ischemic attack)   . Upper GI bleed 06/24/2018  . Vitamin B 12 deficiency   . Vitamin D deficiency     Family History  Problem Relation Age of Onset  . Heart attack Father        died age 19  . Heart attack Brother        died age 110  . Anuerysm Brother        aortic  . Heart attack Sister        died age 49  . Colon cancer Sister   . Liver cancer Sister   . Diabetes Maternal Grandmother   . Arthritis Mother   . Dementia Mother   . Heart Problems Brother   . Heart Problems Brother   . Heart Problems Brother   . Heart Problems Brother   . Healthy Daughter   . Healthy Son   . Esophageal cancer Neg Hx   . Inflammatory bowel disease Neg Hx   . Pancreatic cancer Neg Hx   . Stomach cancer Neg Hx     Past Surgical History:  Procedure Laterality Date  . BIOPSY  06/25/2018   Procedure: BIOPSY;  Surgeon: Rush Landmark Telford Nab., MD;  Location: [facility];  Service: Gastroenterology;;  . BIOPSY  04/24/2019   Procedure: BIOPSY;  Surgeon: Irving Copas., MD;  Location: [facility];  Service: Gastroenterology;;  . BLADDER SURGERY  1969   traumatic pelvic fractures, urethral and bladder repair  . CARDIAC CATHETERIZATION N/A 07/01/2015   Procedure: Left Heart Cath and Coronary Angiography;  Surgeon: Wellington Hampshire, MD;  Location: [facility];  Service: Cardiovascular;   Laterality: N/A;  . COLON RESECTION N/A 05/17/2017   Procedure: DIAGNOSTIC LAPAROSCOPY,;  Surgeon: Leighton Ruff, MD;  [facility];  Service: General;  Laterality: N/A;  . COLONOSCOPY WITH PROPOFOL N/A 04/24/2019   Procedure: COLONOSCOPY WITH PROPOFOL;  Surgeon: Irving Copas., MD;  [facility];  Service: Gastroenterology;  Laterality: N/A;  . CORONARY ANGIOPLASTY WITH STENT PLACEMENT    . CORONARY ARTERY BYPASS GRAFT N/A 07/06/2015   Procedure: CORONARY ARTERY BYPASS GRAFTING (CABG)x 4   utilizing the left internal mammary artery and endoscopically harvested bilateral  sapheneous vein.;  Surgeon: Ivin Poot, MD;  Location: [facility];  Service: Open Heart Surgery;  Laterality: N/A;  . ESOPHAGEAL DILATION  04/24/2019   Procedure: ESOPHAGEAL DILATION;  Surgeon: Rush Landmark Telford Nab., MD;  [facility];  Service: Gastroenterology;;  . ESOPHAGOGASTRODUODENOSCOPY (EGD) WITH PROPOFOL N/A 06/25/2018   Procedure: ESOPHAGOGASTRODUODENOSCOPY (EGD) WITH PROPOFOL;  Surgeon: Irving Copas., MD;  Location: [facility];  Service: Gastroenterology;  Laterality: N/A;  . ESOPHAGOGASTRODUODENOSCOPY (EGD) WITH PROPOFOL N/A 04/24/2019   Procedure: ESOPHAGOGASTRODUODENOSCOPY (EGD) WITH PROPOFOL;  Surgeon: Rush Landmark Telford Nab., MD;  [facility];  Service: Gastroenterology;  Laterality: N/A;  . FEMUR IM NAIL Right 11/16/2019   Procedure: RIGHT HIP INTRAMEDULLARY (IM) NAIL FEMORAL;  Surgeon: Meredith Pel, MD;  Location: [facility];  Service: Orthopedics;  Laterality: Right;  . HEMOSTASIS CLIP PLACEMENT  04/24/2019   Procedure: HEMOSTASIS CLIP PLACEMENT;  Surgeon: Irving Copas., MD;  [facility];  Service: Gastroenterology;;  . KNEE SURGERY    . LOOP RECORDER INSERTION N/A 02/19/2018   Procedure: LOOP RECORDER INSERTION;  Surgeon: Deboraha Sprang, MD;  Location: [facility];  Service: Cardiovascular;  Laterality: N/A;  . POLYPECTOMY   04/24/2019   Procedure: POLYPECTOMY;  Surgeon: Rush Landmark Telford Nab., MD;  Location[facility];  Service: Gastroenterology;;  . Lia Foyer LIFTING INJECTION  04/24/2019   Procedure: SUBMUCOSAL LIFTING INJECTION;  Surgeon: Irving Copas., MD;  [facility];  Service: Gastroenterology;;  . TEE WITHOUT CARDIOVERSION N/A 07/06/2015   Procedure: TRANSESOPHAGEAL ECHOCARDIOGRAM (TEE);  Surgeon: Ivin Poot, MD;  Location: [facility];  Service: Open Heart Surgery;  Laterality: N/A;   Social History   Occupational History  . Occupation: Retired  Tobacco Use  . Smoking status: Former Smoker    Quit date: 07/16/1963    Years since quitting: 56.5  . Smokeless tobacco: Former Systems developer    Types: [place] date: 1989  Media planner  . Vaping Use: Never used  Substance and Sexual Activity  . Alcohol use: No  . Drug use: No  . Sexual activity: Not Currently

## 2020-02-03 NOTE — Progress Notes (Signed)
HPI: FU CAD,h/o HTN,postural hypotension andHLD. Cath 3/17revealed 3 V CAD, he underwent CABG x 4 on 07/07/15. Post op course was unremarkable. He has had difficulties with orthostasis and hypotension sinceCABG.Had loop monitor placed October 2019. Abdominal ultrasound October 2020 showed 3.8 cm abdominal aortic aneurysm, left common iliac aneurysm measuring 2.8 x 2.9 cm and vascular surgery consult recommended.  Carotid Dopplers June 2021 showed 40 to 59% bilateral stenosis.  Patient suffered a mechanical fall July 2021 requiring repair of right hip fracture.  Postoperative course complicated by acute CVA.  Also with elevated troponin felt to be demand ischemia.  Echocardiogram July 2021 showed normal LV function, moderate left ventricular hypertrophy, grade 2 diastolic dysfunction, mild left atrial enlargement.  Since last seen, patient denies dyspnea, chest pain, palpitations or syncope. He has chronic problems with dizziness leading to falls.  Current Outpatient Medications  Medication Sig Dispense Refill  . acetaminophen (TYLENOL) 500 MG tablet Take 1,000 mg by mouth in the morning and at bedtime.     Marland Kitchen amLODipine (NORVASC) 5 MG tablet Take 1 tablet Daily for BP 90 tablet 3  . aspirin EC 81 MG tablet Take 2 tablets (162 mg total) by mouth daily.    Marland Kitchen buPROPion (WELLBUTRIN XL) 150 MG 24 hr tablet Take 1 tablet (150 mg total) by mouth every morning. 90 tablet 1  . Calcium Carb-Cholecalciferol (CALCIUM+D3 PO) Take 1 tablet by mouth daily.    . Cholecalciferol (VITAMIN D-3) 5000 units TABS Take 5,000 Units by mouth daily.    . ferrous sulfate 325 (65 FE) MG tablet Take 325 mg by mouth daily with breakfast.    . fluconazole (DIFLUCAN) 150 MG tablet Take 1     tablet     2 x /week        (every 3 days)        for yeast infection 8 tablet 0  . fludrocortisone (FLORINEF) 0.1 MG tablet Take 2 tablets Daily for Low BP (Patient taking differently: Take 0.1 mg by mouth daily. Daily for Low BP) 180  tablet 0  . levothyroxine (SYNTHROID) 25 MCG tablet Take 1 tablet     Daily      on an empty stomach with only water for 30 minutes & no Antacid meds, Calcium or Magnesium for 4 hours & avoid Biotin 90 tablet 0  . Multiple Vitamin (MULTIVITAMIN WITH MINERALS) TABS tablet Take 1 tablet by mouth daily.    Marland Kitchen olmesartan (BENICAR) 40 MG tablet Take 40 mg by mouth daily as needed.    . pantoprazole (PROTONIX) 40 MG tablet Take 1 tablet Daily for Indigestion & Heartburn (Patient taking differently: Take 40 mg by mouth daily. ) 90 tablet 3  . polyethylene glycol (MIRALAX / GLYCOLAX) packet Take 17 g by mouth daily. (Patient taking differently: Take 17 g by mouth 2 (two) times daily. ) 28 each 1  . pravastatin (PRAVACHOL) 40 MG tablet Take 40 mg by mouth daily.     . pregabalin (LYRICA) 50 MG capsule Take 50 mg by mouth 3 (three) times daily.    . tamsulosin (FLOMAX) 0.4 MG CAPS capsule Take     1 tablet    1 hour before      Bedtime       for bladder emptying 30 capsule 0  . Zinc 50 MG TABS Take 1 tablet by mouth daily.    Marland Kitchen glucose blood test strip Check blood sugar 1 time daily-DX-E11.9 (Patient taking differently: 1  each by Other route as needed. Check blood sugar 1 time daily-DX-E11.9) 100 each 5   No current facility-administered medications for this visit.     Past Medical History:  Diagnosis Date  . AAA (abdominal aortic aneurysm) ([place])   . 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 CAD (coronary artery disease)    a. s/p CABGx4 in 07/2015.  . Carotid artery disease ([place])   . Colon polyps   . Diabetes ([place])   . Difficult intubation   . 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 Esophageal reflux   . High cholesterol   . History of IBS 02/27/2009  . Hypertension    pt denies, he says he has a h/o hypotension. If BP up he adjusts the Florinef  . Jejunitis with partial SBO 05/20/2017  . Orthostatic hypotension   . Prostatitis   . Stroke ([place])   . TIA (transient ischemic attack)   . Upper GI bleed  06/24/2018  . Vitamin B 12 deficiency   . Vitamin D deficiency     Past Surgical History:  Procedure Laterality Date  . BIOPSY  06/25/2018   Procedure: BIOPSY;  Surgeon: Rush Landmark Telford Nab., MD;  Location: [facility];  Service: Gastroenterology;;  . BIOPSY  04/24/2019   Procedure: BIOPSY;  Surgeon: Irving Copas., MD;  [facility];  Service: Gastroenterology;;  . BLADDER SURGERY  1969   traumatic pelvic fractures, urethral and bladder repair  . CARDIAC CATHETERIZATION N/A 07/01/2015   Procedure: Left Heart Cath and Coronary Angiography;  Surgeon: Wellington Hampshire, MD;  Location: [facility];  Service: Cardiovascular;  Laterality: N/A;  . COLON RESECTION N/A 05/17/2017   Procedure: DIAGNOSTIC LAPAROSCOPY,;  Surgeon: Leighton Ruff, MD;  [facility];  Service: General;  Laterality: N/A;  . COLONOSCOPY WITH PROPOFOL N/A 04/24/2019   Procedure: COLONOSCOPY WITH PROPOFOL;  Surgeon: Irving Copas., MD;  [facility];  Service: Gastroenterology;  Laterality: N/A;  . CORONARY ANGIOPLASTY WITH STENT PLACEMENT    . CORONARY ARTERY BYPASS GRAFT N/A 07/06/2015   Procedure: CORONARY ARTERY BYPASS GRAFTING (CABG)x 4   utilizing the left internal mammary artery and endoscopically harvested bilateral  sapheneous vein.;  Surgeon: Ivin Poot, MD;  Location: [facility];  Service: Open Heart Surgery;  Laterality: N/A;  . ESOPHAGEAL DILATION  04/24/2019   Procedure: ESOPHAGEAL DILATION;  Surgeon: Rush Landmark Telford Nab., MD;  [facility];  Service: Gastroenterology;;  . ESOPHAGOGASTRODUODENOSCOPY (EGD) WITH PROPOFOL N/A 06/25/2018   Procedure: ESOPHAGOGASTRODUODENOSCOPY (EGD) WITH PROPOFOL;  Surgeon: Irving Copas., MD;  Location: [facility];  Service: Gastroenterology;  Laterality: N/A;  . ESOPHAGOGASTRODUODENOSCOPY (EGD) WITH PROPOFOL N/A 04/24/2019   Procedure: ESOPHAGOGASTRODUODENOSCOPY (EGD) WITH PROPOFOL;  Surgeon: Rush Landmark Telford Nab., MD;  [facility];  Service: Gastroenterology;  Laterality: N/A;  . FEMUR IM NAIL Right 11/16/2019   Procedure: RIGHT HIP INTRAMEDULLARY (IM) NAIL FEMORAL;  Surgeon: Meredith Pel, MD;  Location: [facility];  Service: Orthopedics;  Laterality: Right;  . HEMOSTASIS CLIP PLACEMENT  04/24/2019   Procedure: HEMOSTASIS CLIP PLACEMENT;  Surgeon: Irving Copas., MD;  [facility];  Service: Gastroenterology;;  . KNEE SURGERY    . LOOP RECORDER INSERTION N/A 02/19/2018   Procedure: LOOP RECORDER INSERTION;  Surgeon: Deboraha Sprang, MD;  Location: [facility];  Service: Cardiovascular;  Laterality: N/A;  . POLYPECTOMY  04/24/2019   Procedure: POLYPECTOMY;  Surgeon: Rush Landmark Telford Nab., MD;  [facility];  Service: Gastroenterology;;  .  SUBMUCOSAL LIFTING INJECTION  04/24/2019   Procedure: SUBMUCOSAL LIFTING INJECTION;  Surgeon: Rush Landmark Telford Nab., MD;  [facility];  Service: Gastroenterology;;  . TEE WITHOUT CARDIOVERSION N/A 07/06/2015   Procedure: TRANSESOPHAGEAL ECHOCARDIOGRAM (TEE);  Surgeon: Ivin Poot, MD;  Location: [facility];  Service: Open Heart Surgery;  Laterality: N/A;    Social History   Socioeconomic History  . Marital status: Married    Spouse name: Not on file  . Number of children: 2  . Years of education: Not on file  . Highest education level: High school graduate  Occupational History  . Occupation: Retired  Tobacco Use  . Smoking status: Former Smoker    Quit date: 07/16/1963    Years since quitting: 56.6  . Smokeless tobacco: Former Systems developer    Types: [place] date: 1989  Media planner  . Vaping Use: Never used  Substance and Sexual Activity  . Alcohol use: No  . Drug use: No  . Sexual activity: Not Currently  Other Topics Concern  . Not on file  Social History Narrative   Lives at home with his wife Estill Bamberg   Right handed   No caffeine   Social Determinants of Health   Financial Resource  Strain:   . Difficulty of Paying Living Expenses: Not on file  Food Insecurity:   . Worried About Charity fundraiser in the Last Year: Not on file  . Ran Out of Food in the Last Year: Not on file  Transportation Needs:   . Lack of Transportation (Medical): Not on file  . Lack of Transportation (Non-Medical): Not on file  Physical Activity:   . Days of Exercise per Week: Not on file  . Minutes of Exercise per Session: Not on file  Stress:   . Feeling of Stress : Not on file  Social Connections:   . Frequency of Communication with Friends and Family: Not on file  . Frequency of Social Gatherings with Friends and Family: Not on file  . Attends Religious Services: Not on file  . Active Member of Clubs or Organizations: Not on file  . Attends Archivist Meetings: Not on file  . Marital Status: Not on file  Intimate Partner Violence:   . Fear of Current or Ex-Partner: Not on file  . Emotionally Abused: Not on file  . Physically Abused: Not on file  . Sexually Abused: Not on file    Family History  Problem Relation Age of Onset  . Heart attack Father        died age 62  . Heart attack Brother        died age 46  . Anuerysm Brother        aortic  . Heart attack Sister        died age 63  . Colon cancer Sister   . Liver cancer Sister   . Diabetes Maternal Grandmother   . Arthritis Mother   . Dementia Mother   . Heart Problems Brother   . Heart Problems Brother   . Heart Problems Brother   . Heart Problems Brother   . Healthy Daughter   . Healthy Son   . Esophageal cancer Neg Hx   . Inflammatory bowel disease Neg Hx   . Pancreatic cancer Neg Hx   . Stomach cancer Neg Hx     ROS: no fevers or chills, productive cough, hemoptysis, dysphasia, odynophagia, melena, hematochezia, dysuria, hematuria, rash, seizure activity, orthopnea, PND,  pedal edema, claudication. Remaining systems are negative.  Physical Exam: Well-developed well-nourished in no acute distress.    Skin is warm and dry.  HEENT is normal.  Neck is supple.  Chest is clear to auscultation with normal expansion.  Cardiovascular exam is regular rate and rhythm.  Abdominal exam nontender or distended. No masses palpated. Extremities show no edema. neuro grossly intact  A/P  1 coronary artery disease-patient denies chest pain.  Continue aspirin and pravastatin.  2 hyperlipidemia-continue pravastatin.  3 abdominal aortic aneurysm/iliac aneurysm-plan follow-up ultrasound.  4 history of CVA-implantable loop monitor in place.  5 history of orthostasis-symptoms are reasonably well controlled per patient's wife.  Kirk Ruths, MD

## 2020-02-10 ENCOUNTER — Telehealth: Payer: Self-pay | Admitting: *Deleted

## 2020-02-10 ENCOUNTER — Other Ambulatory Visit: Payer: Self-pay | Admitting: Internal Medicine

## 2020-02-10 NOTE — Telephone Encounter (Signed)
Returned a call to the spouse regarding Xarelto. Per Dr Melford Aase, The patient was on Xarelto for 10 days after his surgery and then return to ASA 81 MG 2 tablets daily.

## 2020-02-12 ENCOUNTER — Other Ambulatory Visit: Payer: Self-pay

## 2020-02-12 ENCOUNTER — Ambulatory Visit (INDEPENDENT_AMBULATORY_CARE_PROVIDER_SITE_OTHER): Payer: Medicare Other | Admitting: Cardiology

## 2020-02-12 ENCOUNTER — Encounter: Payer: Self-pay | Admitting: Cardiology

## 2020-02-12 VITALS — BP 106/60 | HR 85 | Ht 72.0 in | Wt 164.0 lb

## 2020-02-12 DIAGNOSIS — I714 Abdominal aortic aneurysm, without rupture, unspecified: Secondary | ICD-10-CM

## 2020-02-12 DIAGNOSIS — E7800 Pure hypercholesterolemia, unspecified: Secondary | ICD-10-CM

## 2020-02-12 DIAGNOSIS — I2510 Atherosclerotic heart disease of native coronary artery without angina pectoris: Secondary | ICD-10-CM

## 2020-02-12 NOTE — Patient Instructions (Signed)
  Testing/Procedures:  Your physician has requested that you have an abdominal aorta duplex. During this test, an ultrasound is used to evaluate the aorta. Allow 30 minutes for this exam. Do not eat after midnight the day before and avoid carbonated beverages [facility]   Follow-Up: At [facility], you and your health needs are our priority.  As part of our continuing mission to provide you with exceptional heart care, we have created designated Provider Care Teams.  These Care Teams include your primary Cardiologist (physician) and Advanced Practice Providers (APPs -  Physician Assistants and Nurse Practitioners) who all work together to provide you with the care you need, when you need it.  We recommend signing up for the patient portal called "MyChart".  Sign up information is provided on this After Visit Summary.  MyChart is used to connect with patients for Virtual Visits (Telemedicine).  Patients are able to view lab/test results, encounter notes, upcoming appointments, etc.  Non-urgent messages can be sent to your provider as well.   To learn more about what you can do with MyChart, go to [email or web address].    Your next appointment:   6 month(s)  The format for your next appointment:   In Person  Provider:   Kirk Ruths, MD

## 2020-02-15 ENCOUNTER — Other Ambulatory Visit: Payer: Self-pay

## 2020-02-15 ENCOUNTER — Ambulatory Visit: Payer: Medicare Other | Attending: Internal Medicine

## 2020-02-15 DIAGNOSIS — Z23 Encounter for immunization: Secondary | ICD-10-CM

## 2020-02-15 NOTE — Progress Notes (Signed)
   Covid-19 Vaccination Clinic  Name:  [name]    MRN: [id number] DOB: Sep 28, 1933  02/15/2020  [name] was observed post Covid-19 immunization for 15 minutes without incident. He was provided with Vaccine Information Sheet and instruction to access the V-Safe system.   [name] was instructed to call 911 with any severe reactions post vaccine: Marland Kitchen Difficulty breathing  . Swelling of face and throat  . A fast heartbeat  . A bad rash all over body  . Dizziness and weakness

## 2020-02-19 ENCOUNTER — Ambulatory Visit (INDEPENDENT_AMBULATORY_CARE_PROVIDER_SITE_OTHER): Payer: Medicare Other

## 2020-02-19 DIAGNOSIS — I6310 Cerebral infarction due to embolism of unspecified precerebral artery: Secondary | ICD-10-CM

## 2020-02-19 LAB — CUP PACEART REMOTE DEVICE CHECK
Date Time Interrogation Session: 20211020022333
Implantable Pulse Generator Implant Date: 20191021

## 2020-02-20 ENCOUNTER — Other Ambulatory Visit (HOSPITAL_COMMUNITY): Payer: Self-pay | Admitting: Cardiology

## 2020-02-20 ENCOUNTER — Other Ambulatory Visit: Payer: Self-pay

## 2020-02-20 ENCOUNTER — Ambulatory Visit (HOSPITAL_COMMUNITY)
Admission: RE | Admit: 2020-02-20 | Discharge: 2020-02-20 | Disposition: A | Discharge status: Home / Self Care | Payer: Medicare Other | Source: Ambulatory Visit | Attending: Cardiovascular Disease | Admitting: Cardiovascular Disease

## 2020-02-20 ENCOUNTER — Telehealth: Payer: Self-pay | Admitting: *Deleted

## 2020-02-20 DIAGNOSIS — I714 Abdominal aortic aneurysm, without rupture, unspecified: Secondary | ICD-10-CM

## 2020-02-20 DIAGNOSIS — I723 Aneurysm of iliac artery: Secondary | ICD-10-CM

## 2020-02-20 NOTE — Telephone Encounter (Addendum)
-----   Message from Lelon Perla, MD sent at 02/20/2020  8:45 AM EDT ----- Schedule vascular surgery consult for iliac aneurysms. Kirk Ruths  Spoke with pt, Aware of dr Jacalyn Lefevre recommendations.  Referral placed.

## 2020-02-22 ENCOUNTER — Other Ambulatory Visit: Payer: Self-pay | Admitting: Internal Medicine

## 2020-02-25 NOTE — Progress Notes (Signed)
Carelink Summary Report / Loop Recorder 

## 2020-03-05 ENCOUNTER — Ambulatory Visit (INDEPENDENT_AMBULATORY_CARE_PROVIDER_SITE_OTHER): Payer: Medicare Other | Admitting: Internal Medicine

## 2020-03-05 ENCOUNTER — Other Ambulatory Visit: Payer: Self-pay

## 2020-03-05 VITALS — BP 140/82 | HR 81 | Temp 97.0°F | Resp 16 | Ht 72.0 in | Wt 186.6 lb

## 2020-03-05 DIAGNOSIS — E1169 Type 2 diabetes mellitus with other specified complication: Secondary | ICD-10-CM

## 2020-03-05 DIAGNOSIS — E785 Hyperlipidemia, unspecified: Secondary | ICD-10-CM

## 2020-03-05 DIAGNOSIS — R0989 Other specified symptoms and signs involving the circulatory and respiratory systems: Secondary | ICD-10-CM

## 2020-03-05 DIAGNOSIS — E559 Vitamin D deficiency, unspecified: Secondary | ICD-10-CM | POA: Diagnosis not present

## 2020-03-05 DIAGNOSIS — N1831 Chronic kidney disease, stage 3a: Secondary | ICD-10-CM

## 2020-03-05 DIAGNOSIS — E1122 Type 2 diabetes mellitus with diabetic chronic kidney disease: Secondary | ICD-10-CM | POA: Diagnosis not present

## 2020-03-05 DIAGNOSIS — Z79899 Other long term (current) drug therapy: Secondary | ICD-10-CM

## 2020-03-05 DIAGNOSIS — Z23 Encounter for immunization: Secondary | ICD-10-CM

## 2020-03-05 DIAGNOSIS — M7061 Trochanteric bursitis, right hip: Secondary | ICD-10-CM

## 2020-03-05 MED ORDER — MELOXICAM 15 MG PO TABS: ORAL_TABLET | ORAL | 0 refills | Status: DC

## 2020-03-05 MED ORDER — DEXAMETHASONE 4 MG PO TABS: ORAL_TABLET | ORAL | 0 refills | Status: DC

## 2020-03-05 NOTE — Patient Instructions (Signed)

## 2020-03-05 NOTE — Progress Notes (Signed)
History of Present Illness:       This very nice 86 y.o.  MWM presents for 6 month follow up with HTN,  ASCAD /CABG,  HLD, T2_DM and Vitamin D Deficiency.       Patient is treated for HTN  (1995) & since patient has developed dysautonomia with postural Hypotension & labile supine hypertension. Patient wife has doe an excellent job monitoring his BP's frequently and deciding how to adjust his BP meds and Florinef.  has been controlled at home. Today's BP 140/82.  Patient has ASCAD - s/p CABG in 2017.  Patient is monitored for a 3.8 cm AAA and Car Aa stenosis  (betw 40-59%).  In July 2021 patient fell sustaining a Rt hip fx requiring pinning .  Since home from rehab center,  patient has had no complaints of any cardiac type chest pain, palpitations, dyspnea / orthopnea / PND, dizziness, claudication, or dependent edema.      Hyperlipidemia is controlled with diet & meds. Patient denies myalgias or other med SE's. Last Lipids were at goal:  Lab Results  Component Value Date   CHOL 101 11/19/2019   HDL 26 (L) 11/19/2019   LDLCALC 59 11/19/2019   TRIG 82 11/19/2019   CHOLHDL 3.9 11/19/2019    Also, the patient has history of  Diet controlledT2_DM and has had no symptoms of reactive hypoglycemia, diabetic polys, paresthesias or visual blurring.  Last A1c was   Lab Results  Component Value Date   HGBA1C 5.1 11/19/2019           Further, the patient also has history of Vitamin D Deficiency and supplements vitamin D without any suspected side-effects. Last vitamin D was  Lab Results  Component Value Date   VD25OH 77 08/29/2019    Current Outpatient Medications on File Prior to Visit  Medication Sig  . Acetaminophen 500 MG tablet Take 1,000 mg in the morning and at bedtime.   Marland Kitchen amLODipine 5 MG tablet Take 1 tablet Daily for BP  . aspirin EC 81 MG tablet Take 2 tablets  daily.  Marland Kitchen buPROPion-XL) 150 MG  Take 1 tablet  every morning.  . Calcium -Cholecalciferol  Take 1 tablet by  mouth daily.  Marland Kitchen VITAMIN D 5000 units TABS Take 5,000 Units by mouth daily.  . ferrous sulfate 325 (65 FE) MG  Take 325 mg daily with breakfast.  . fluconazole  150 MG tablet Take 1  Tab 2 x /week (every 3 days) for yeast infection  . FLORINEF 0.1 MG tablet Take 1-2 tablets Daily for Low BP   . levothyroxine ( 25 MCG tablet Take 1 tablet Daily     . Multi-Vit w/Min Take 1 tablet  daily.  Marland Kitchen olmesartan  40 MG tablet Take  daily as needed.  . pantoprazole  40 MG tablet Take 1 tablet Daily  . polyethylene glycol   Take 17 g  2  times daily. )  . pravastatin  40 MG tablet Take  daily.   . pregabalin 50 MG capsule Take 5 3 (three) times daily.  . tamsulosin  0.4 MG CAPS  Take 1 tablet  Bedtime  . Zinc 50 MG TABS Take 1 tablet daily.   No current facility-administered medications on file prior to visit.    Allergies  Allergen Reactions  . Other Other (See Comments)    Pt reports allergic to a medication but unsure of name or type of med  . Cymbalta [Duloxetine  Hcl] Other (See Comments)    Dizziness, hallucinations.   . Keflex [Cephalexin] Other (See Comments)    dizziness  . Simvastatin Other (See Comments)    Joint pain  . Sudafed [Pseudoephedrine] Other (See Comments)    Dizziness    PMHx:   Past Medical History:  Diagnosis Date  . AAA (abdominal aortic aneurysm) ([place])   . 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 CAD (coronary artery disease)    a. s/p CABGx4 in 07/2015.  . Carotid artery disease ([place])   . Colon polyps   . Diabetes ([place])   . Difficult intubation   . 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 Esophageal reflux   . High cholesterol   . History of IBS 02/27/2009  . Hypertension    pt denies, he says he has a h/o hypotension. If BP up he adjusts the Florinef  . Jejunitis with partial SBO 05/20/2017  . Orthostatic hypotension   . Prostatitis   . Stroke ([place])   . TIA (transient ischemic attack)   . Upper GI bleed 06/24/2018  . Vitamin B 12 deficiency   . Vitamin D  deficiency     Immunization History  Administered Date(s) Administered  . Influenza, High Dose Seasonal PF 12/30/2013, 02/02/2015, 01/27/2016, 01/29/2017, 01/31/2018, 02/20/2019  . Influenza-Unspecified 01/13/2013, 01/29/2017, 01/30/2018  . PFIZER SARS-COV-2 Vaccination 05/21/2019, 06/11/2019, 02/15/2020  . Pneumococcal Conjugate-13 02/14/2014  . Pneumococcal-Unspecified 06/15/2004  . Tetanus 10/01/2012  . Zoster 10/01/2012    Past Surgical History:  Procedure Laterality Date  . BIOPSY  06/25/2018   Procedure: BIOPSY;  Surgeon: Rush Landmark Telford Nab., MD;  Location: [facility];  Service: Gastroenterology;;  . BIOPSY  04/24/2019   Procedure: BIOPSY;  Surgeon: Irving Copas., MD;  Location: [facility];  Service: Gastroenterology;;  . BLADDER SURGERY  1969   traumatic pelvic fractures, urethral and bladder repair  . CARDIAC CATHETERIZATION N/A 07/01/2015   Procedure: Left Heart Cath and Coronary Angiography;  Surgeon: Wellington Hampshire, MD;  Location: [facility];  Service: Cardiovascular;  Laterality: N/A;  . COLON RESECTION N/A 05/17/2017   Procedure: DIAGNOSTIC LAPAROSCOPY,;  Surgeon: Leighton Ruff, MD;  [facility];  Service: General;  Laterality: N/A;  . COLONOSCOPY WITH PROPOFOL N/A 04/24/2019   Procedure: COLONOSCOPY WITH PROPOFOL;  Surgeon: Irving Copas., MD;  [facility];  Service: Gastroenterology;  Laterality: N/A;  . CORONARY ANGIOPLASTY WITH STENT PLACEMENT    . CORONARY ARTERY BYPASS GRAFT N/A 07/06/2015   Procedure: CORONARY ARTERY BYPASS GRAFTING (CABG)x 4   utilizing the left internal mammary artery and endoscopically harvested bilateral  sapheneous vein.;  Surgeon: Ivin Poot, MD;  Location: [facility];  Service: Open Heart Surgery;  Laterality: N/A;  . ESOPHAGEAL DILATION  04/24/2019   Procedure: ESOPHAGEAL DILATION;  Surgeon: Rush Landmark Telford Nab., MD;  [facility];  Service: Gastroenterology;;  .  ESOPHAGOGASTRODUODENOSCOPY (EGD) WITH PROPOFOL N/A 06/25/2018   Procedure: ESOPHAGOGASTRODUODENOSCOPY (EGD) WITH PROPOFOL;  Surgeon: Irving Copas., MD;  Location: [facility];  Service: Gastroenterology;  Laterality: N/A;  . ESOPHAGOGASTRODUODENOSCOPY (EGD) WITH PROPOFOL N/A 04/24/2019   Procedure: ESOPHAGOGASTRODUODENOSCOPY (EGD) WITH PROPOFOL;  Surgeon: Rush Landmark Telford Nab., MD;  [facility];  Service: Gastroenterology;  Laterality: N/A;  . FEMUR IM NAIL Right 11/16/2019   Procedure: RIGHT HIP INTRAMEDULLARY (IM) NAIL FEMORAL;  Surgeon: Meredith Pel, MD;  Location: [facility];  Service: Orthopedics;  Laterality: Right;  . HEMOSTASIS CLIP PLACEMENT  04/24/2019   Procedure: HEMOSTASIS CLIP PLACEMENT;  Surgeon: Irving Copas., MD;  Location: [facility];  Service: Gastroenterology;;  . KNEE SURGERY    . LOOP RECORDER INSERTION N/A 02/19/2018   Procedure: LOOP RECORDER INSERTION;  Surgeon: Deboraha Sprang, MD;  Location: [facility];  Service: Cardiovascular;  Laterality: N/A;  . POLYPECTOMY  04/24/2019   Procedure: POLYPECTOMY;  Surgeon: Rush Landmark Telford Nab., MD;  Location: [facility];  Service: Gastroenterology;;  . Lia Foyer LIFTING INJECTION  04/24/2019   Procedure: SUBMUCOSAL LIFTING INJECTION;  Surgeon: Irving Copas., MD;  [facility];  Service: Gastroenterology;;  . TEE WITHOUT CARDIOVERSION N/A 07/06/2015   Procedure: TRANSESOPHAGEAL ECHOCARDIOGRAM (TEE);  Surgeon: Ivin Poot, MD;  Location: [facility];  Service: Open Heart Surgery;  Laterality: N/A;    FHx:    Reviewed / unchanged  SHx:    Reviewed / unchanged   Systems Review:  Constitutional: Denies fever, chills, wt changes, headaches, insomnia, fatigue, night sweats, change in appetite. Eyes: Denies redness, blurred vision, diplopia, discharge, itchy, watery eyes.  ENT: Denies discharge, congestion, post nasal drip, epistaxis, sore throat, earache, hearing loss,  dental pain, tinnitus, vertigo, sinus pain, snoring.  CV: Denies chest pain, palpitations, irregular heartbeat, syncope, dyspnea, diaphoresis, orthopnea, PND, claudication or edema. Respiratory: denies cough, dyspnea, DOE, pleurisy, hoarseness, laryngitis, wheezing.  Gastrointestinal: Denies dysphagia, odynophagia, heartburn, reflux, water brash, abdominal pain or cramps, nausea, vomiting, bloating, diarrhea, constipation, hematemesis, melena, hematochezia  or hemorrhoids. Genitourinary: Denies dysuria, frequency, urgency, nocturia, hesitancy, discharge, hematuria or flank pain. Musculoskeletal: Denies arthralgias, myalgias, stiffness, jt. swelling, pain, limping or strain/sprain.  Skin: Denies pruritus, rash, hives, warts, acne, eczema or change in skin lesion(s). Neuro: No weakness, tremor, incoordination, spasms, paresthesia or pain. Psychiatric: Denies confusion, memory loss or sensory loss. Endo: Denies change in weight, skin or hair change.  Heme/Lymph: No excessive bleeding, bruising or enlarged lymph nodes.  Physical Exam  BP 140/82   Pulse 81   Temp (!) 97 F (36.1 C)   Resp 16   SpO2 97%   Appears  well nourished, well groomed  and in no distress.  Eyes: PERRLA, EOMs, conjunctiva no swelling or erythema. Sinuses: No frontal/maxillary tenderness ENT/Mouth: EAC's clear, TM's nl w/o erythema, bulging. Nares clear w/o erythema, swelling, exudates. Oropharynx clear without erythema or exudates. Oral hygiene is good. Tongue normal, non obstructing. Hearing intact.  Neck: Supple. Thyroid not palpable. Car 2+/2+ without bruits, nodes or JVD. Chest: Respirations nl with BS clear & equal w/o rales, rhonchi, wheezing or stridor.  Cor: Heart sounds normal w/ regular rate and rhythm without sig. murmurs, gallops, clicks or rubs. Peripheral pulses normal and equal  without edema.  Abdomen: Soft & bowel sounds normal. Non-tender w/o guarding, rebound, hernias, masses or organomegaly.    Lymphatics: Unremarkable.  Musculoskeletal:  Moderate tenderness of the Rt Hip - Gr Trochanteric Bursa.In Wheelchair - non-ambulatory. Skin: Warm, dry without exposed rashes, lesions or ecchymosis apparent.  Neuro: Cranial nerves intact, reflexes equal bilaterally. Sensory-motor testing grossly intact. Tendon reflexes grossly intact.  Pysch: Alert & oriented x 3.  Insight and judgement nl & appropriate. No ideations.  Assessment and Plan:  1. Labile hypertension  - Continue medication, monitor blood pressure at home.  - Continue DASH diet.  Reminder to go to the ER if any CP,  SOB, nausea, dizziness, severe HA, changes vision/speech.  - CBC with Differential/Platelet - COMPLETE METABOLIC PANEL WITH GFR - Magnesium - Lipid panel  2. Hyperlipidemia associated with type 2 diabetes mellitus ([place])  - Continue  diet/meds, exercise,& lifestyle modifications.  - Continue monitor periodic cholesterol/liver & renal functions   - Lipid panel - TSH  3. Type 2 diabetes mellitus with stage 3a chronic kidney  disease, without long-term current use of insulin (HCC)  - Continue diet, exercise  - Lifestyle modifications.  - Monitor appropriate labs.  - Hemoglobin A1c - Insulin, random  4. Vitamin D deficiency  - Continue supplementation.  - VITAMIN D 25 Hydroxy  5. Greater trochanteric bursitis of right hip  - dexamethasone (DECADRON) 4 MG tablet; Take 1 tab  3 x day for  3 days / then 2 x day for 3 days / then 1 tab  Daily for Hip Bursitis  Dispense: 20 tablet  - meloxicam (MOBIC) 15 MG tablet; Take 1/2 to 1 tablet  Daily for Pain & Inflammation  Dispense: 90 tablet    6. Need for immunization against influenza  - Flu vaccine HIGH DOSE PF (Fluzone High dose)  7. Medication management  - CBC with Differential/Platelet - COMPLETE METABOLIC PANEL WITH GFR - Magnesium - Lipid panel - TSH - Hemoglobin A1c - Insulin, random - VITAMIN D 25 Hydroxy         Discussed   regular exercise, BP monitoring, weight control to achieve/maintain BMI less than 25 and discussed med and SE's. Recommended labs to assess and monitor clinical status with further disposition pending results of labs.  I discussed the assessment and treatment plan with the patient. The patient was provided an opportunity to ask questions and all were answered. The patient agreed with the plan and demonstrated an understanding of the instructions.  I provided over 30 minutes of exam, counseling, chart review and  complex critical decision making.         The patient was advised to call back or seek an in-person evaluation if the symptoms worsen or if the condition fails to improve as anticipated.   Kirtland Bouchard, MD

## 2020-03-06 ENCOUNTER — Ambulatory Visit: Payer: Self-pay

## 2020-03-06 ENCOUNTER — Ambulatory Visit: Payer: Medicare Other | Admitting: Specialist

## 2020-03-06 ENCOUNTER — Encounter: Payer: Self-pay | Admitting: Specialist

## 2020-03-06 VITALS — BP 117/66 | HR 90 | Ht 72.0 in | Wt 164.0 lb

## 2020-03-06 DIAGNOSIS — M4807 Spinal stenosis, lumbosacral region: Secondary | ICD-10-CM | POA: Diagnosis not present

## 2020-03-06 DIAGNOSIS — M25559 Pain in unspecified hip: Secondary | ICD-10-CM

## 2020-03-06 DIAGNOSIS — M25551 Pain in right hip: Secondary | ICD-10-CM | POA: Diagnosis not present

## 2020-03-06 LAB — LIPID PANEL
Cholesterol: 131 mg/dL (ref <200)
HDL: 36 mg/dL — ABNORMAL LOW (ref >=40)
LDL Cholesterol (Calc): 77 mg/dL (calc)
Non-HDL Cholesterol (Calc): 95 mg/dL (calc) (ref <130)
Total CHOL/HDL Ratio: 3.6 (calc) (ref <5.0)
Triglycerides: 94 mg/dL (ref <150)

## 2020-03-06 LAB — COMPLETE METABOLIC PANEL WITH GFR
AG Ratio: 1.4 (calc) (ref 1.0–2.5)
ALT: 8 U/L — ABNORMAL LOW (ref 9–46)
AST: 19 U/L (ref 10–35)
Albumin: 3.7 g/dL (ref 3.6–5.1)
Alkaline phosphatase (APISO): 112 U/L (ref 35–144)
BUN/Creatinine Ratio: 16 (calc) (ref 6–22)
BUN: 23 mg/dL (ref 7–25)
CO2: 32 mmol/L (ref 20–32)
Calcium: 9.2 mg/dL (ref 8.6–10.3)
Chloride: 105 mmol/L (ref 98–110)
Creat: 1.45 mg/dL — ABNORMAL HIGH (ref 0.70–1.11)
GFR, Est African American: 51 mL/min/{1.73_m2} — ABNORMAL LOW (ref >=60)
GFR, Est Non African American: 44 mL/min/{1.73_m2} — ABNORMAL LOW (ref >=60)
Globulin: 2.6 g/dL (calc) (ref 1.9–3.7)
Glucose, Bld: 90 mg/dL (ref 65–99)
Potassium: 4.6 mmol/L (ref 3.5–5.3)
Sodium: 143 mmol/L (ref 135–146)
Total Bilirubin: 0.5 mg/dL (ref 0.2–1.2)
Total Protein: 6.3 g/dL (ref 6.1–8.1)

## 2020-03-06 LAB — TSH: TSH: 2.46 mIU/L (ref 0.40–4.50)

## 2020-03-06 LAB — CBC WITH DIFFERENTIAL/PLATELET
Absolute Monocytes: 525 cells/uL (ref 200–950)
Basophils Absolute: 69 cells/uL (ref 0–200)
Basophils Relative: 0.8 %
Eosinophils Absolute: 361 cells/uL (ref 15–500)
Eosinophils Relative: 4.2 %
HCT: 34.8 % — ABNORMAL LOW (ref 38.5–50.0)
Hemoglobin: 11.4 g/dL — ABNORMAL LOW (ref 13.2–17.1)
Lymphs Abs: 2451 cells/uL (ref 850–3900)
MCH: 32.5 pg (ref 27.0–33.0)
MCHC: 32.8 g/dL (ref 32.0–36.0)
MCV: 99.1 fL (ref 80.0–100.0)
MPV: 10.1 fL (ref 7.5–12.5)
Monocytes Relative: 6.1 %
Neutro Abs: 5194 cells/uL (ref 1500–7800)
Neutrophils Relative %: 60.4 %
Platelets: 195 10*3/uL (ref 140–400)
RBC: 3.51 10*6/uL — ABNORMAL LOW (ref 4.20–5.80)
RDW: 13.6 % (ref 11.0–15.0)
Total Lymphocyte: 28.5 %
WBC: 8.6 10*3/uL (ref 3.8–10.8)

## 2020-03-06 LAB — HEMOGLOBIN A1C
Hgb A1c MFr Bld: 5.5 % of total Hgb (ref <5.7)
Mean Plasma Glucose: 111 (calc)
eAG (mmol/L): 6.2 (calc)

## 2020-03-06 LAB — MAGNESIUM: Magnesium: 2.1 mg/dL (ref 1.5–2.5)

## 2020-03-06 LAB — INSULIN, RANDOM: Insulin: 5.9 u[IU]/mL

## 2020-03-06 LAB — VITAMIN D 25 HYDROXY (VIT D DEFICIENCY, FRACTURES): Vit D, 25-Hydroxy: 100 ng/mL (ref 30–100)

## 2020-03-06 NOTE — Patient Instructions (Addendum)
Plan: Avoid bending, stooping and avoid lifting weights greater than 10 lbs. Avoid prolong standing and walking. Avoid frequent bending and stooping  No lifting greater than 10 lbs. May use ice or moist heat for pain. Weight loss is of benefit. Handicap license is approved. MRI of the lumbar spine as last study is nearly 86 years old. I believe his fall was aa direct result of right leg weakness And he is still having right leg difficulty, now 4 months post op right intertrochanteric hip fracture ORIF. I think likely this is due to preexisting stenosis. He is nearly confined to a WC. And I think it would be advantagous to increase his capacity to stand and walk for sake of quality of life.

## 2020-03-06 NOTE — Progress Notes (Addendum)
Office Visit Note   Patient: [name]           Date of Birth: 07-18-1933           MRN: [id number] Visit Date: 03/06/2020              Requested by: [name], [address] [place],  [place] [address] PCP: [name], MD   Assessment & Plan: Visit Diagnoses:  1. Hip pain   2. Spinal stenosis of lumbosacral region     Plan: Avoid bending, stooping and avoid lifting weights greater than 10 lbs. Avoid prolong standing and walking. Avoid frequent bending and stooping  No lifting greater than 10 lbs. May use ice or moist heat for pain. Weight loss is of benefit. Handicap license is approved. MRI of the lumbar spine as last study is nearly 86 years old. I believe his fall was a direct result of right leg weakness And he is still having right leg difficulty, now 4 months post op right intertrochanteric hip fracture ORIF. I think likely this is due to preexisting stenosis. He is nearly confined to a W/C. And I think it would be advantagous to increase his capacity to stand and walk for sake of quality of life.   Follow-Up Instructions: Return in about 4 weeks (around 04/03/2020).   Orders:  Orders Placed This Encounter  Procedures  . XR HIP UNILAT W OR W/O PELVIS 2-3 VIEWS RIGHT  . MR Lumbar Spine w/o contrast   No orders of the defined types were placed in this encounter.     Procedures: No procedures performed   Clinical Data: No additional findings.   Subjective: Chief Complaint  Patient presents with  . Lower Back - Follow-up    Doing good.  . Right Foot - Follow-up    Still waiting on brace for the foot drop--was fitted on 02/04/20 and it would take 4-6 weeks to get it    HPI  Review of Systems   Objective: Vital Signs: BP 117/66 (BP Location: Right Arm, Patient Position: Sitting)   Pulse 90   Ht 6' (1.829 m)   Wt 164 lb (74.4 kg)   BMI 22.24 kg/m   Physical Exam  Ortho Exam  Specialty Comments:  No  specialty comments available.  Imaging: XR HIP UNILAT W OR W/O PELVIS 2-3 VIEWS RIGHT  Result Date: 03/06/2020 AP pelvis and lateral right hip show that the right intertrochanteric hip fracture is fixed with locking screws x 2 one compression and the 2nd an antirotation screw into the femoral neck and head in good posiition and alignment. There is no significant change in position and alignment of the right 3 part intertrochanteric hip fracture site. An area of lucency between the lesser trochanter and the medial proximal femur, pausicity of callus in this area. CT scan of the right hip from 01/15/2020 does not suggest pathologic fracture or tumor of the lesser trochanter.    PMFS History: Patient Active Problem List   Diagnosis Date Noted  . Situational depression 01/22/2020  . Urinary frequency 01/22/2020  . Bacteremia due to Staphylococcus epidermidis 11/24/2019  . Stroke (cerebrum) ([place])   . Elevated troponin   . Closed right hip fracture ([place]) 11/15/2019  . Unilateral primary osteoarthritis, right knee 11/13/2019  . At high risk for falls 05/29/2019  . Chronic bilateral low back pain without sciatica 02/07/2019  . Statin intolerance 11/16/2018  . Hyperlipidemia, mixed 08/15/2018  . Low back pain 07/18/2018  .  History of lacunar cerebrovascular accident (CVA) 02/12/2018  . Dizziness 02/12/2018  . Myofascial pain 12/18/2017  . Spondylosis without myelopathy or radiculopathy, lumbar region 12/18/2017  . Lumbar radiculopathy, right 12/18/2017  . Cholelithiasis 05/17/2017  . Sinus Bradycardia 05/17/2017  . Thoracic radiculopathy 04/12/2017  . Primary osteoarthritis of right knee 12/14/2016  . DDD (degenerative disc disease), lumbar 10/10/2016  . Lumbar facet arthropathy 08/29/2016  . Idiopathic scoliosis 03/22/2016  . Diabetic neuropathy ([place]) 03/22/2016  . Dysautonomia orthostatic hypotension syndrome 02/25/2016  . CKD stage 3 due to type 2 diabetes mellitus ([place]) 02/19/2016  .  Coronary artery disease involving coronary bypass graft of native heart without angina pectoris   . Diabetes mellitus type 2, diet-controlled ([place])   . 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 Medication management 08/11/2015  . 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 PVD (peripheral vascular disease) ([place]) 01/01/2014  . Hyperlipidemia associated with type 2 diabetes mellitus ([place]) 04/11/2013  . Supine hypertension   . Vitamin D deficiency   . Vitamin B 12 deficiency   . Coronary atherosclerosis- s/p PCI to LAD in 2009 and PCI to RCA in 2011 02/27/2009  . Abdominal aortic aneurysm ([place]) 02/27/2009  . GERD 02/27/2009   Past Medical History:  Diagnosis Date  . AAA (abdominal aortic aneurysm) ([facility])   . 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 CAD (coronary artery disease)    a. s/p CABGx4 in 07/2015.  . Carotid artery disease ([place])   . Colon polyps   . Diabetes ([place])   . Difficult intubation   . 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 Esophageal reflux   . High cholesterol   . History of IBS 02/27/2009  . Hypertension    pt denies, he says he has a h/o hypotension. If BP up he adjusts the Florinef  . Jejunitis with partial SBO 05/20/2017  . Orthostatic hypotension   . Prostatitis   . Stroke ([place])   . TIA (transient ischemic attack)   . Upper GI bleed 06/24/2018  . Vitamin B 12 deficiency   . Vitamin D deficiency     Family History  Problem Relation Age of Onset  . Heart attack Father        died age 43  . Heart attack Brother        died age 78  . Anuerysm Brother        aortic  . Heart attack Sister        died age 81  . Colon cancer Sister   . Liver cancer Sister   . Diabetes Maternal Grandmother   . Arthritis Mother   . Dementia Mother   . Heart Problems Brother   . Heart Problems Brother   . Heart Problems Brother   . Heart Problems Brother   . Healthy Daughter   . Healthy Son   . Esophageal cancer Neg Hx   . Inflammatory bowel disease Neg Hx   . Pancreatic cancer Neg Hx   . Stomach  cancer Neg Hx     Past Surgical History:  Procedure Laterality Date  . BIOPSY  06/25/2018   Procedure: BIOPSY;  Surgeon: Rush Landmark Telford Nab., MD;  Location: [facility];  Service: Gastroenterology;;  . BIOPSY  04/24/2019   Procedure: BIOPSY;  Surgeon: Irving Copas., MD;  Location[facility];  Service: Gastroenterology;;  . BLADDER SURGERY  1969   traumatic pelvic fractures, urethral and bladder repair  . CARDIAC CATHETERIZATION N/A 07/01/2015   Procedure: Left Heart Cath and  Coronary Angiography;  Surgeon: Wellington Hampshire, MD;  Location: [facility];  Service: Cardiovascular;  Laterality: N/A;  . COLON RESECTION N/A 05/17/2017   Procedure: DIAGNOSTIC LAPAROSCOPY,;  Surgeon: Leighton Ruff, MD;  [facility];  Service: General;  Laterality: N/A;  . COLONOSCOPY WITH PROPOFOL N/A 04/24/2019   Procedure: COLONOSCOPY WITH PROPOFOL;  Surgeon: Irving Copas., MD;  [facility];  Service: Gastroenterology;  Laterality: N/A;  . CORONARY ANGIOPLASTY WITH STENT PLACEMENT    . CORONARY ARTERY BYPASS GRAFT N/A 07/06/2015   Procedure: CORONARY ARTERY BYPASS GRAFTING (CABG)x 4   utilizing the left internal mammary artery and endoscopically harvested bilateral  sapheneous vein.;  Surgeon: Ivin Poot, MD;  Location: [facility];  Service: Open Heart Surgery;  Laterality: N/A;  . ESOPHAGEAL DILATION  04/24/2019   Procedure: ESOPHAGEAL DILATION;  Surgeon: Rush Landmark Telford Nab., MD;  [facility];  Service: Gastroenterology;;  . ESOPHAGOGASTRODUODENOSCOPY (EGD) WITH PROPOFOL N/A 06/25/2018   Procedure: ESOPHAGOGASTRODUODENOSCOPY (EGD) WITH PROPOFOL;  Surgeon: Irving Copas., MD;  Location: [facility];  Service: Gastroenterology;  Laterality: N/A;  . ESOPHAGOGASTRODUODENOSCOPY (EGD) WITH PROPOFOL N/A 04/24/2019   Procedure: ESOPHAGOGASTRODUODENOSCOPY (EGD) WITH PROPOFOL;  Surgeon: Rush Landmark Telford Nab., MD;  [facility];  Service:  Gastroenterology;  Laterality: N/A;  . FEMUR IM NAIL Right 11/16/2019   Procedure: RIGHT HIP INTRAMEDULLARY (IM) NAIL FEMORAL;  Surgeon: Meredith Pel, MD;  Location: [facility];  Service: Orthopedics;  Laterality: Right;  . HEMOSTASIS CLIP PLACEMENT  04/24/2019   Procedure: HEMOSTASIS CLIP PLACEMENT;  Surgeon: Irving Copas., MD;  [facility];  Service: Gastroenterology;;  . KNEE SURGERY    . LOOP RECORDER INSERTION N/A 02/19/2018   Procedure: LOOP RECORDER INSERTION;  Surgeon: Deboraha Sprang, MD;  Location: [facility];  Service: Cardiovascular;  Laterality: N/A;  . POLYPECTOMY  04/24/2019   Procedure: POLYPECTOMY;  Surgeon: Rush Landmark Telford Nab., MD;  [facility];  Service: Gastroenterology;;  . Lia Foyer LIFTING INJECTION  04/24/2019   Procedure: SUBMUCOSAL LIFTING INJECTION;  Surgeon: Irving Copas., MD;  [facility];  Service: Gastroenterology;;  . TEE WITHOUT CARDIOVERSION N/A 07/06/2015   Procedure: TRANSESOPHAGEAL ECHOCARDIOGRAM (TEE);  Surgeon: Ivin Poot, MD;  Location: [facility];  Service: Open Heart Surgery;  Laterality: N/A;   Social History   Occupational History  . Occupation: Retired  Tobacco Use  . Smoking status: Former Smoker    Quit date: 07/16/1963    Years since quitting: 56.6  . Smokeless tobacco: Former Systems developer    Types: [place] date: 1989  Media planner  . Vaping Use: Never used  Substance and Sexual Activity  . Alcohol use: No  . Drug use: No  . Sexual activity: Not Currently

## 2020-03-07 ENCOUNTER — Encounter: Payer: Self-pay | Admitting: Internal Medicine

## 2020-03-07 NOTE — Progress Notes (Signed)
========================================================== ==========================================================  -    Still a mild anemia  - Be sure taking and OTC iron supplement and also recommend take   - a sublingual Vitamin B12 tablet 1,000 to 5,000 mcg that you dissolve under tongue Daily  ==========================================================  -  Kidney Functions still suggest mild Dehydration - Very important to drink at least 5 to 6 bottles (16 oz) of water or Fluids /day  ==========================================================  -  Total Chol = 131 and LDL Chol = 77 - Both  Excellent   - Very low risk for Heart Attack  / Stroke =============================================================  - A1c - Normal - Great - No Diabetes ==========================================================  -  Vitamin DS = 100- Excellent  ==========================================================  -  All Else - CBC - Kidneys - Electrolytes - Liver - Magnesium & Thyroid    - all  Normal / OK ====================================================   - Keep up the [place] Work  !  ========================================================== ==========================================================

## 2020-03-17 ENCOUNTER — Other Ambulatory Visit: Payer: Self-pay

## 2020-03-17 ENCOUNTER — Encounter: Payer: Self-pay | Admitting: Adult Health Nurse Practitioner

## 2020-03-17 ENCOUNTER — Ambulatory Visit: Payer: Medicare Other | Admitting: Adult Health Nurse Practitioner

## 2020-03-17 VITALS — BP 126/80 | HR 66 | Temp 97.7°F | Ht 72.0 in

## 2020-03-17 DIAGNOSIS — S29011A Strain of muscle and tendon of front wall of thorax, initial encounter: Secondary | ICD-10-CM

## 2020-03-17 DIAGNOSIS — R0789 Other chest pain: Secondary | ICD-10-CM

## 2020-03-17 NOTE — Patient Instructions (Signed)
  Put Ice to the area 8min about 4 times a day.  Continue taking Tylenol  Apply topical Voltaren gel, this is an antiinflammatory.

## 2020-03-17 NOTE — Progress Notes (Signed)
Assessment and Plan:  [name] was seen today for acute visit.  Diagnoses and all orders for this visit:  Musculoskeletal chest pain Muscle strain of chest wall, initial encounter Discussed icing area 21min, 4 times a day. Take Tylenol 1,000mg  three times a day for pain Discussed antiinflammatory , topical voltaren gel  Discussed monitoring for continued coughing with food or liquids?  Swallow evaluation.  Discussed S&S of pneumonia, continue to monitor.      Further disposition pending results of labs. Discussed med's effects and SE's.   Over 30 minutes of exam, counseling, chart review, and critical decision making was performed.   Future Appointments  Date Time Provider [place]  03/23/2020  9:00 AM [facility] DEVICE REMOTES [facility]  03/28/2020  6:00 PM GI-315 MR 2 GI-315MRI GI-[address]  04/01/2020  8:00 AM MC-CV HS VASC 2 - MC [facility]  04/01/2020  9:00 AM MC-CV HS VASC 2 - MC [facility]  04/01/2020  9:20 AM Angelia Mould, MD [facility]  04/13/2020  2:15 PM [name], MD [facility] None  04/27/2020  9:00 AM [facility] DEVICE REMOTES [facility]  06/01/2020  9:00 AM [facility] DEVICE REMOTES [facility]  06/09/2020 11:30 AM Akeen Ledyard, Danton Sewer, NP [facility] None  07/06/2020  9:00 AM [facility] DEVICE REMOTES [facility]  08/10/2020  9:00 AM [facility] DEVICE REMOTES [facility]  09/14/2020  9:00 AM [facility] DEVICE REMOTES [facility]  10/14/2020 10:00 AM [name], MD [facility] None  10/19/2020  9:00 AM [facility] DEVICE REMOTES [facility]  12/22/2020  9:00 AM [facility] DEVICE REMOTES [facility]  12/28/2020  9:00 AM [facility] DEVICE REMOTES [facility]  02/01/2021  9:00 AM [facility] DEVICE REMOTES [facility]  03/08/2021  9:00 AM [facility] DEVICE REMOTES [facility]  04/12/2021  9:00 AM  [facility] DEVICE REMOTES [facility]    ------------------------------------------------------------------------------------------------------------------   HPI 86 y.o.male presents for chest pains that started one day ago.  The pain is constant pain that is 7/10 pain, left side.  Nothing makes it better or worse.  Denies any falls or trauma.  Wife reports they have not taken any medication or tried anything to make this better.  She has not noticed any change in his behavior or energy level.  Wife reports he got choaked on honey and had a severe coughing episode and light cough since then.    Past Medical History:  Diagnosis Date  . AAA (abdominal aortic aneurysm) ([place])   . 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 CAD (coronary artery disease)    a. s/p CABGx4 in 07/2015.  . Carotid artery disease ([place])   . Colon polyps   . Diabetes ([place])   . Difficult intubation   . 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 Esophageal reflux   . High cholesterol   . History of IBS 02/27/2009  . Hypertension    pt denies, he says he has a h/o hypotension. If BP up he adjusts the [place]  . Jejunitis with partial SBO 05/20/2017  . Orthostatic hypotension   . Prostatitis   . Stroke ([place])   . TIA (transient ischemic attack)   . Upper GI bleed 06/24/2018  . Vitamin B 12 deficiency   . Vitamin D deficiency      Allergies  Allergen Reactions  . Other Other (See Comments)    Pt reports allergic to a medication but unsure of name or type of med  . Cymbalta [Duloxetine Hcl] Other (See Comments)  Dizziness, hallucinations.   . Keflex [Cephalexin] Other (See Comments)    dizziness  . Simvastatin Other (See Comments)    Joint pain  . Sudafed [Pseudoephedrine] Other (See Comments)    Dizziness    Current Outpatient Medications on File Prior to Visit  Medication Sig  . acetaminophen (TYLENOL) 500 MG tablet Take 1,000 mg by mouth in the morning and at bedtime.   Marland Kitchen amLODipine (NORVASC) 5  MG tablet Take 1 tablet Daily for BP  . aspirin EC 81 MG tablet Take 2 tablets (162 mg total) by mouth daily.  Marland Kitchen buPROPion (WELLBUTRIN XL) 150 MG 24 hr tablet Take 1 tablet (150 mg total) by mouth every morning.  . Calcium Carb-Cholecalciferol (CALCIUM+D3 PO) Take 1 tablet by mouth daily.  . Cholecalciferol (VITAMIN D-3) 5000 units TABS Take 5,000 Units by mouth daily.  Marland Kitchen dexamethasone (DECADRON) 4 MG tablet Take 1 tab 3 x day for  3 days / then 2 x day for 3 days / then 1 tab Daily for Hip Bursitis  . ferrous sulfate 325 (65 FE) MG tablet Take 325 mg by mouth daily with breakfast.  . fluconazole (DIFLUCAN) 150 MG tablet Take 1     tablet     2 x /week        (every 3 days)        for yeast infection  . fludrocortisone (FLORINEF) 0.1 MG tablet Take 2 tablets Daily for Low BP (Patient taking differently: Take 0.1 mg by mouth daily. Daily for Low BP)  . glucose blood test strip Check blood sugar 1 time daily-DX-E11.9 (Patient taking differently: 1 each by Other route as needed. Check blood sugar 1 time daily-DX-E11.9)  . levothyroxine (SYNTHROID) 25 MCG tablet Take 1 tablet     Daily      on an empty stomach with only water for 30 minutes & no Antacid meds, Calcium or Magnesium for 4 hours & avoid Biotin  . meloxicam (MOBIC) 15 MG tablet Take 1/2 to 1 tablet Daily for Pain & Inflammation  . Multiple Vitamin (MULTIVITAMIN WITH MINERALS) TABS tablet Take 1 tablet by mouth daily.  Marland Kitchen olmesartan (BENICAR) 40 MG tablet Take 40 mg by mouth daily as needed.  . pantoprazole (PROTONIX) 40 MG tablet Take 1 tablet Daily for Indigestion & Heartburn (Patient taking differently: Take 40 mg by mouth daily. )  . polyethylene glycol (MIRALAX / GLYCOLAX) packet Take 17 g by mouth daily. (Patient taking differently: Take 17 g by mouth 2 (two) times daily. )  . pravastatin (PRAVACHOL) 40 MG tablet Take 40 mg by mouth daily.   . pregabalin (LYRICA) 50 MG capsule Take 50 mg by mouth 3 (three) times daily.  . tamsulosin  (FLOMAX) 0.4 MG CAPS capsule Take     1 tablet    1 hour      Before Bedtime       for bladder emptying  . Zinc 50 MG TABS Take 1 tablet by mouth daily.   No current facility-administered medications on file prior to visit.    ROS: all negative except above.   Physical Exam:  BP 126/80   Pulse 66   Temp 97.7 F (36.5 C)   Ht 6' (1.829 m)   SpO2 99%   BMI 22.24 kg/m   General Appearance: Well nourished, in no apparent distress. Eyes: PERRLA, EOMs, conjunctiva no swelling or erythema Sinuses: No Frontal/maxillary tenderness ENT/Mouth: Ext aud canals clear, TMs without erythema, bulging. No erythema, swelling, or exudate  on post pharynx.  Tonsils not swollen or erythematous. Hearing normal.  Neck: Supple, thyroid normal.  Respiratory: Respiratory effort normal, BS equal bilaterally without rales, rhonchi, wheezing or stridor.  Cardio: RRR with no MRGs. Brisk peripheral pulses without edema.  Abdomen: Soft, + BS.  Non tender, no guarding, rebound, hernias, masses. Lymphatics: Non tender without lymphadenopathy.  Musculoskeletal: Full ROM, 5/5 strength, normal gait. Point tenderness to lateral chest wall with palpation. Skin: Warm, dry without rashes, lesions, ecchymosis.  Neuro: Cranial nerves intact. Normal muscle tone, no cerebellar symptoms. Sensation intact.  Psych: Awake and oriented X 2, normal affect, Insight and Judgment appropriate.     Garnet Sierras, NP 4:18 PM [facility]

## 2020-03-22 ENCOUNTER — Other Ambulatory Visit: Payer: Self-pay

## 2020-03-22 ENCOUNTER — Ambulatory Visit
Admission: RE | Admit: 2020-03-22 | Discharge: 2020-03-22 | Disposition: A | Discharge status: Home / Self Care | Payer: Medicare Other | Source: Ambulatory Visit | Attending: Specialist | Admitting: Specialist

## 2020-03-22 DIAGNOSIS — M4807 Spinal stenosis, lumbosacral region: Secondary | ICD-10-CM

## 2020-03-23 ENCOUNTER — Ambulatory Visit (INDEPENDENT_AMBULATORY_CARE_PROVIDER_SITE_OTHER): Payer: Medicare Other

## 2020-03-23 ENCOUNTER — Other Ambulatory Visit: Payer: Self-pay | Admitting: *Deleted

## 2020-03-23 DIAGNOSIS — I723 Aneurysm of iliac artery: Secondary | ICD-10-CM

## 2020-03-23 DIAGNOSIS — I714 Abdominal aortic aneurysm, without rupture, unspecified: Secondary | ICD-10-CM

## 2020-03-23 DIAGNOSIS — I6310 Cerebral infarction due to embolism of unspecified precerebral artery: Secondary | ICD-10-CM

## 2020-03-23 LAB — CUP PACEART REMOTE DEVICE CHECK
Date Time Interrogation Session: 20211120013402
Implantable Pulse Generator Implant Date: 20191021

## 2020-03-27 ENCOUNTER — Other Ambulatory Visit: Payer: Self-pay | Admitting: Internal Medicine

## 2020-03-28 ENCOUNTER — Other Ambulatory Visit: Payer: Medicare Other

## 2020-03-30 ENCOUNTER — Encounter: Payer: Self-pay | Admitting: Internal Medicine

## 2020-03-30 ENCOUNTER — Ambulatory Visit: Payer: Medicare Other | Admitting: Internal Medicine

## 2020-03-30 ENCOUNTER — Other Ambulatory Visit: Payer: Self-pay

## 2020-03-30 VITALS — BP 130/70 | HR 86 | Temp 97.5°F

## 2020-03-30 DIAGNOSIS — M940 Chondrocostal junction syndrome [Tietze]: Secondary | ICD-10-CM | POA: Diagnosis not present

## 2020-03-30 MED ORDER — DEXAMETHASONE 4 MG PO TABS
ORAL_TABLET | ORAL | 0 refills | Status: DC
Start: 2020-03-30 — End: 2020-05-05

## 2020-03-30 NOTE — Progress Notes (Signed)
History of Present Illness:     This very nice 86 yo MWM with multiple medical problems who is wheelchair limited by unstable gait presents with a 1 week hx/o  Left mid Chest pain & tenderness which he has had before and responded to a short course of steroids. Denies congestion, dyspnea and rash of the chest. Caretaker wife relates stools are "black"  and she concerned of GI Bleeding.   Medications  .  fludrocortisone (FLORINEF) 0.1 MG tablet, Take 2 tablets Daily for Low BP (Patient taking differently: Take 0.1 mg by mouth daily. Daily for Low BP) .  levothyroxine (SYNTHROID) 25 MCG tablet, Take 1 tablet     Daily      on an empty stomach with only water for 30 minutes & no Antacid meds, Calcium or Magnesium for 4 hours & avoid Biotin .  amLODipine (NORVASC) 5 MG tablet, Take 1 tablet Daily for BP .  olmesartan (BENICAR) 40 MG tablet, Take 40 mg by mouth daily as needed. .  pravastatin (PRAVACHOL) 40 MG tablet, Take 40 mg by mouth daily.  Marland Kitchen  acetaminophen (TYLENOL) 500 MG tablet, Take 1,000 mg by mouth in the morning and at bedtime.  Marland Kitchen  aspirin EC 81 MG tablet, Take 2 tablets (162 mg total) by mouth daily. .  butalbital-acetaminophen-caffeine (FIORICET) 50-325-40 MG tablet, Take     1 tablet      every 4 hours       ONLY       if needed for Severe Headache .  meloxicam (MOBIC) 15 MG tablet, Take 1/2 to 1 tablet Daily for Pain & Inflammation .  ferrous sulfate 325 (65 FE) MG tablet, Take 325 mg by mouth daily with breakfast.  Current Outpatient Medications (Other):  Marland Kitchen  buPROPion (WELLBUTRIN XL) 150 MG 24 hr tablet, Take 1 tablet (150 mg total) by mouth every morning. .  Calcium Carb-Cholecalciferol (CALCIUM+D3 PO), Take 1 tablet by mouth daily. .  Cholecalciferol (VITAMIN D-3) 5000 units TABS, Take 5,000 Units by mouth daily. .  fluconazole (DIFLUCAN) 150 MG tablet, Take 1     tablet     2 x /week        (every 3 days)        for yeast infection .  glucose blood test strip, Check blood  sugar 1 time daily-DX-E11.9 (Patient taking differently: 1 each by Other route as needed. Check blood sugar 1 time daily-DX-E11.9) .  Multiple Vitamin (MULTIVITAMIN WITH MINERALS) TABS tablet, Take 1 tablet by mouth daily. .  pantoprazole (PROTONIX) 40 MG tablet, Take 1 tablet Daily for Indigestion & Heartburn (Patient taking differently: Take 40 mg by mouth daily. ) .  polyethylene glycol (MIRALAX / GLYCOLAX) packet, Take 17 g by mouth daily. (Patient taking differently: Take 17 g by mouth 2 (two) times daily. ) .  pregabalin (LYRICA) 50 MG capsule, Take 50 mg by mouth 3 (three) times daily. .  tamsulosin (FLOMAX) 0.4 MG CAPS capsule, Take     1 tablet    1 hour      Before Bedtime       for bladder emptying .  Zinc 50 MG TABS, Take 1 tablet by mouth daily.  Problem list He has Coronary atherosclerosis- s/p PCI to LAD in 2009 and PCI to RCA in 2011; Abdominal aortic aneurysm ([place]); GERD; Supine hypertension; Vitamin D deficiency; Vitamin B 12 deficiency; Hyperlipidemia associated with type 2 diabetes mellitus ([place]); PVD (peripheral vascular disease) ([place]); S/P CABG x  4; Medication management; Intercostal neuralgia; Coronary artery disease involving coronary bypass graft of native heart without angina pectoris; Diabetes mellitus type 2, diet-controlled ([place]); CKD stage 3 due to type 2 diabetes mellitus ([place]); Dysautonomia orthostatic hypotension syndrome; Idiopathic scoliosis; Diabetic neuropathy ([place]); Lumbar facet arthropathy; DDD (degenerative disc disease), lumbar; Primary osteoarthritis of right knee; Thoracic radiculopathy; Cholelithiasis; Sinus Bradycardia; Myofascial pain; Spondylosis without myelopathy or radiculopathy, lumbar region; Lumbar radiculopathy, right; History of lacunar cerebrovascular accident (CVA); Dizziness; Low back pain; Hyperlipidemia, mixed; Statin intolerance; Chronic bilateral low back pain without sciatica; At high risk for falls; Unilateral primary osteoarthritis, right  knee; Closed right hip fracture ([place]); Elevated troponin; Stroke (cerebrum) ([place]); Bacteremia due to Staphylococcus epidermidis; Situational depression; and Urinary frequency on their problem list.   Observations/Objective:  BP 130/70   Pulse 86   Temp (!) 97.5 F (36.4 C)   SpO2 98%   HEENT - WNL. Neck - supple.  Chest - Clear equal BS.    (+) exquisite tenderness of the Left 4th intercostal area emulating his pain Cor - Nl HS. RRR w/o sig MGR. PP 1(+). No edema. DRE - finds Prostate 1 + smooth and dark green stool - Hemoccult - Negative MS-  Generalized decrease in muscle , power , tone & bulk. Wheel chair limited. Neuro -  Nl w/o focal abnormalities.  Assessment and Plan:  1. Costochondritis  - dexamethasone  4 MG tablet; Take 1 tab 3 x day - 3 days, then 2 x day - 3 days, then 1 tab daily   Disp: 20 tablet  - Advised holding his meloxicam while taking Dexamethasone   Follow Up Instructions:        I discussed the assessment and treatment plan with the patient. The patient was provided an opportunity to ask questions and all were answered. The patient agreed with the plan and demonstrated an understanding of the instructions.       The patient was advised to call back or seek an in-person evaluation if the symptoms worsen or if the condition fails to improve as anticipated.    Kirtland Bouchard, MD

## 2020-03-30 NOTE — Progress Notes (Signed)
Carelink Summary Report / Loop Recorder 

## 2020-03-30 NOTE — Patient Instructions (Signed)
Costochondritis  Costochondritis is swelling and irritation (inflammation) of the tissue (cartilage) that connects your ribs to your breastbone (sternum). This causes pain in the front of your chest. The pain usually starts gradually and involves more than one rib. What are the causes? The exact cause of this condition is not always known. It results from stress on the cartilage where your ribs attach to your sternum. The cause of this stress could be:  Chest injury (trauma).  Exercise or activity, such as lifting.  Severe coughing. What increases the risk? You may be at higher risk for this condition if you:  Are male.  Are 30?84 years old.  Recently started a new exercise or work activity.  Have low levels of vitamin D.  Have a condition that makes you cough frequently. What are the signs or symptoms? The main symptom of this condition is chest pain. The pain:  Usually starts gradually and can be sharp or dull.  Gets worse with deep breathing, coughing, or exercise.  Gets better with rest.  May be worse when you press on the sternum-rib connection (tenderness). How is this diagnosed? This condition is diagnosed based on your symptoms, medical history, and a physical exam. Your health care provider will check for tenderness when pressing on your sternum. This is the most important finding. You may also have tests to rule out other causes of chest pain. These may include:  A chest X-ray to check for lung problems.  An electrocardiogram (ECG) to see if you have a heart problem that could be causing the pain.  An imaging scan to rule out a chest or rib fracture. How is this treated? This condition usually goes away on its own over time. Your health care provider may prescribe an NSAID to reduce pain and inflammation. Your health care provider may also suggest that you:  Rest and avoid activities that make pain worse.  Apply heat or cold to the area to reduce pain and  inflammation.  Do exercises to stretch your chest muscles. If these treatments do not help, your health care provider may inject a numbing medicine at the sternum-rib connection to help relieve the pain. Follow these instructions at home:  Avoid activities that make pain worse. This includes any activities that use chest, abdominal, and side muscles.  If directed, put ice on the painful area: ? Put ice in a plastic bag. ? Place a towel between your skin and the bag. ? Leave the ice on for 20 minutes, 2-3 times a day.  If directed, apply heat to the affected area as often as told by your health care provider. Use the heat source that your health care provider recommends, such as a moist heat pack or a heating pad. ? Place a towel between your skin and the heat source. ? Leave the heat on for 20-30 minutes. ? Remove the heat if your skin turns bright red. This is especially important if you are unable to feel pain, heat, or cold. You may have a greater risk of getting burned.  Take over-the-counter and prescription medicines only as told by your health care provider.  Return to your normal activities as told by your health care provider. Ask your health care provider what activities are safe for you.  Keep all follow-up visits as told by your health care provider. This is important. Contact a health care provider if:  You have chills or a fever.  Your pain does not go away or it gets   worse.  You have a cough that does not go away (is persistent). Get help right away if:  You have shortness of breath.

## 2020-04-01 ENCOUNTER — Encounter (HOSPITAL_COMMUNITY): Payer: Self-pay

## 2020-04-01 ENCOUNTER — Ambulatory Visit: Payer: Medicare Other | Admitting: Vascular Surgery

## 2020-04-01 ENCOUNTER — Ambulatory Visit (HOSPITAL_COMMUNITY)
Admission: RE | Admit: 2020-04-01 | Discharge: 2020-04-01 | Disposition: A | Discharge status: Home / Self Care | Payer: Medicare Other | Source: Ambulatory Visit | Attending: Vascular Surgery | Admitting: Vascular Surgery

## 2020-04-01 ENCOUNTER — Other Ambulatory Visit: Payer: Self-pay

## 2020-04-01 ENCOUNTER — Encounter: Payer: Self-pay | Admitting: Vascular Surgery

## 2020-04-01 VITALS — BP 132/79 | HR 100 | Temp 98.3°F | Resp 20 | Ht 72.0 in | Wt 164.0 lb

## 2020-04-01 DIAGNOSIS — I714 Abdominal aortic aneurysm, without rupture, unspecified: Secondary | ICD-10-CM

## 2020-04-01 DIAGNOSIS — I723 Aneurysm of iliac artery: Secondary | ICD-10-CM

## 2020-04-01 NOTE — Progress Notes (Signed)
REASON FOR CONSULT:    Abdominal aortic aneurysm and bilateral common iliac artery aneurysms.  The consult is requested by Dr. Stanford Frank.  ASSESSMENT & PLAN:   ABDOMINAL AORTIC ANEURYSM/BILATERAL COMMON ILIAC ARTERY ANEURYSMS: This patient has had some slight enlargement in his abdominal aortic aneurysm from 3.8 cm to 4.2 cm.  In addition the left common iliac artery has a large from 2.9 to 3.1 cm.  I do not think these enlargements are alarming.  I have again explained that we would not typically consider elective repair of an abdominal aortic aneurysm in a normal risk patient unless it reached 5.5 cm in maximum diameter.  We would not normally consider elective repair of an iliac artery aneurysm in a normal risk patient unless it reached 3.54 cm in maximum diameter.  He is 86 and somewhat frail so I think he would be at slightly higher risk for intervention.  I have recommended follow-up ultrasound in 9 months.  If the aneurysms did enlarge then he would need a CT angiogram to determine if you would be a candidate for endovascular repair of his aneurysms.  Given the iliac disease he would likely require coil embolization of the left hypogastric artery versus using an iliac branch device.  Of note he does have prominent popliteal pulses and I have recommended a duplex of his popliteal artery when he returns in 9 months.   Dennis Mayo, MD Office: [phone]   HPI:   [name] is a pleasant 86 y.o. male, who I last saw on 03/27/2019 with a 3.8 cm infrarenal abdominal aortic aneurysms and bilateral common iliac artery aneurysms.  He had a recent ultrasound done by Dr. Jacalyn Frank office which showed some slight enlargement of these aneurysms and he sent back for vascular evaluation.  He has some chronic abdominal pain but no acute onset abdominal pain or back pain.  He quit smoking in 1965.  His blood pressure has been under good control.  He denies any history of claudication or rest  pain.  His activity is somewhat limited.  He has a brace on his right leg.  Past Medical History:  Diagnosis Date  . AAA (abdominal aortic aneurysm) ()   . 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 CAD (coronary artery disease)    a. s/p CABGx4 in 07/2015.  . Carotid artery disease ([place])   . Colon polyps   . Diabetes ([place])   . Difficult intubation   . 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 Esophageal reflux   . High cholesterol   . History of IBS 02/27/2009  . Hypertension    pt denies, he says he has a h/o hypotension. If BP up he adjusts the Florinef  . Jejunitis with partial SBO 05/20/2017  . Orthostatic hypotension   . Prostatitis   . Stroke ([place])   . TIA (transient ischemic attack)   . Upper GI bleed 06/24/2018  . Vitamin B 12 deficiency   . Vitamin D deficiency     Family History  Problem Relation Age of Onset  . Heart attack Father        died age 5  . Heart attack Brother        died age 74  . Anuerysm Brother        aortic  . Heart attack Sister        died age 52  . Colon cancer Sister   . Liver cancer Sister   . Diabetes Maternal Grandmother   .  Arthritis Mother   . Dementia Mother   . Heart Problems Brother   . Heart Problems Brother   . Heart Problems Brother   . Heart Problems Brother   . Healthy Daughter   . Healthy Son   . Esophageal cancer Neg Hx   . Inflammatory bowel disease Neg Hx   . Pancreatic cancer Neg Hx   . Stomach cancer Neg Hx     SOCIAL HISTORY: Social History   Socioeconomic History  . Marital status: Married    Spouse name: Not on file  . Number of children: 2  . Years of education: Not on file  . Highest education level: High school graduate  Occupational History  . Occupation: Retired  Tobacco Use  . Smoking status: Former Smoker    Quit date: 07/16/1963    Years since quitting: 56.7  . Smokeless tobacco: Former Systems developer    Types: [place] date: 1989  Media planner  . Vaping Use: Never used  Substance and Sexual Activity    . Alcohol use: No  . Drug use: No  . Sexual activity: Not Currently  Other Topics Concern  . Not on file  Social History Narrative   Lives at home with his wife [name]   Right handed   No caffeine   Social Determinants of Health   Financial Resource Strain:   . Difficulty of Paying Living Expenses: Not on file  Food Insecurity:   . Worried About Charity fundraiser in the Last Year: Not on file  . Ran Out of Food in the Last Year: Not on file  Transportation Needs:   . Lack of Transportation (Medical): Not on file  . Lack of Transportation (Non-Medical): Not on file  Physical Activity:   . Days of Exercise per Week: Not on file  . Minutes of Exercise per Session: Not on file  Stress:   . Feeling of Stress : Not on file  Social Connections:   . Frequency of Communication with Friends and Family: Not on file  . Frequency of Social Gatherings with Friends and Family: Not on file  . Attends Religious Services: Not on file  . Active Member of Clubs or Organizations: Not on file  . Attends Archivist Meetings: Not on file  . Marital Status: Not on file  Intimate Partner Violence:   . Fear of Current or Ex-Partner: Not on file  . Emotionally Abused: Not on file  . Physically Abused: Not on file  . Sexually Abused: Not on file    Allergies  Allergen Reactions  . Other Other (See Comments)    Pt reports allergic to a medication but unsure of name or type of med  . Cymbalta [Duloxetine Hcl] Other (See Comments)    Dizziness, hallucinations.   . Keflex [Cephalexin] Other (See Comments)    dizziness  . Simvastatin Other (See Comments)    Joint pain  . Sudafed [Pseudoephedrine] Other (See Comments)    Dizziness    Current Outpatient Medications  Medication Sig Dispense Refill  . acetaminophen (TYLENOL) 500 MG tablet Take 1,000 mg by mouth in the morning and at bedtime.     Marland Kitchen amLODipine (NORVASC) 5 MG tablet Take 1 tablet Daily for BP 90 tablet 3  . aspirin EC  81 MG tablet Take 2 tablets (162 mg total) by mouth daily.    Marland Kitchen buPROPion (WELLBUTRIN XL) 150 MG 24 hr tablet Take 1 tablet (150 mg total) by mouth  every morning. 90 tablet 1  . butalbital-acetaminophen-caffeine (FIORICET) 50-325-40 MG tablet Take     1 tablet      every 4 hours       ONLY       if needed for Severe Headache 30 tablet 0  . Calcium Carb-Cholecalciferol (CALCIUM+D3 PO) Take 1 tablet by mouth daily.    . Cholecalciferol (VITAMIN D-3) 5000 units TABS Take 5,000 Units by mouth daily.    Marland Kitchen dexamethasone (DECADRON) 4 MG tablet Take 1 tab 3 x day - 3 days, then 2 x day - 3 days, then 1 tab daily 20 tablet 0  . ferrous sulfate 325 (65 FE) MG tablet Take 325 mg by mouth daily with breakfast.    . fluconazole (DIFLUCAN) 150 MG tablet Take 1     tablet     2 x /week        (every 3 days)        for yeast infection 8 tablet 0  . fludrocortisone (FLORINEF) 0.1 MG tablet Take 2 tablets Daily for Low BP (Patient taking differently: Take 0.1 mg by mouth daily. Daily for Low BP) 180 tablet 0  . glucose blood test strip Check blood sugar 1 time daily-DX-E11.9 (Patient taking differently: 1 each by Other route as needed. Check blood sugar 1 time daily-DX-E11.9) 100 each 5  . levothyroxine (SYNTHROID) 25 MCG tablet Take 1 tablet     Daily      on an empty stomach with only water for 30 minutes & no Antacid meds, Calcium or Magnesium for 4 hours & avoid Biotin 90 tablet 0  . meloxicam (MOBIC) 15 MG tablet Take 1/2 to 1 tablet Daily for Pain & Inflammation 90 tablet 0  . Multiple Vitamin (MULTIVITAMIN WITH MINERALS) TABS tablet Take 1 tablet by mouth daily.    Marland Kitchen olmesartan (BENICAR) 40 MG tablet Take 40 mg by mouth daily as needed.    . pantoprazole (PROTONIX) 40 MG tablet Take 1 tablet Daily for Indigestion & Heartburn (Patient taking differently: Take 40 mg by mouth daily. ) 90 tablet 3  . polyethylene glycol (MIRALAX / GLYCOLAX) packet Take 17 g by mouth daily. (Patient taking differently: Take 17 g by  mouth 2 (two) times daily. ) 28 each 1  . pravastatin (PRAVACHOL) 40 MG tablet Take 40 mg by mouth daily.     . pregabalin (LYRICA) 50 MG capsule Take 50 mg by mouth 3 (three) times daily.    . tamsulosin (FLOMAX) 0.4 MG CAPS capsule Take     1 tablet    1 hour      Before Bedtime       for bladder emptying 90 capsule 0  . Zinc 50 MG TABS Take 1 tablet by mouth daily.     No current facility-administered medications for this visit.    REVIEW OF SYSTEMS:  [X]  denotes positive finding, [ ]  denotes negative finding Cardiac  Comments:  Chest pain or chest pressure:    Shortness of breath upon exertion:    Short of breath when lying flat:    Irregular heart rhythm:        Vascular    Pain in calf, thigh, or hip brought on by ambulation:    Pain in feet at night that wakes you up from your sleep:     Blood clot in your veins:    Leg swelling:         Pulmonary    Oxygen at home:  Productive cough:     Wheezing:         Neurologic    Sudden weakness in arms or legs:     Sudden numbness in arms or legs:     Sudden onset of difficulty speaking or slurred speech:    Temporary loss of vision in one eye:     Problems with dizziness:         Gastrointestinal    Blood in stool:     Vomited blood:         Genitourinary    Burning when urinating:     Blood in urine:        Psychiatric    Major depression:         Hematologic    Bleeding problems:    Problems with blood clotting too easily:        Skin    Rashes or ulcers:        Constitutional    Fever or chills:     PHYSICAL EXAM:   Vitals:   04/01/20 0816  Weight: 164 lb (74.4 kg)  Height: 6' (1.829 m)    GENERAL: The patient is a well-nourished male, in no acute distress. The vital signs are documented above. CARDIAC: There is a regular rate and rhythm.  VASCULAR: I do not detect carotid bruits. He has palpable femoral pulses. Both feet are warm and well-perfused. PULMONARY: There is good air exchange  bilaterally without wheezing or rales. ABDOMEN: Soft and non-tender with normal pitched bowel sounds.  I cannot palpate his aneurysm.  His abdomen is nontender. MUSCULOSKELETAL: There are no major deformities or cyanosis. NEUROLOGIC: No focal weakness or paresthesias are detected. SKIN: There are no ulcers or rashes noted. PSYCHIATRIC: The patient has a normal affect.  DATA:    DUPLEX ABDOMINAL AORTA: I have reviewed his duplex of the abdominal aorta that was done on 02/20/2020.  The maximum diameter of his infrarenal aorta was 4.2 cm.  His right common iliac artery measured 2.9 cm in maximum diameter.  His left common iliac artery measured 3.1 cm in maximum diameter.  LABS: I reviewed his labs from 03/05/2020 which showed a GFR of 44.

## 2020-04-12 ENCOUNTER — Other Ambulatory Visit: Payer: Self-pay | Admitting: Internal Medicine

## 2020-04-12 DIAGNOSIS — E782 Mixed hyperlipidemia: Secondary | ICD-10-CM

## 2020-04-13 ENCOUNTER — Ambulatory Visit: Payer: Medicare Other | Admitting: Specialist

## 2020-04-13 ENCOUNTER — Other Ambulatory Visit: Payer: Self-pay

## 2020-04-13 ENCOUNTER — Encounter: Payer: Self-pay | Admitting: Specialist

## 2020-04-13 VITALS — BP 110/71 | HR 91 | Ht 72.0 in | Wt 164.0 lb

## 2020-04-13 DIAGNOSIS — M25559 Pain in unspecified hip: Secondary | ICD-10-CM

## 2020-04-13 DIAGNOSIS — I6381 Other cerebral infarction due to occlusion or stenosis of small artery: Secondary | ICD-10-CM | POA: Diagnosis not present

## 2020-04-13 DIAGNOSIS — I69359 Hemiplegia and hemiparesis following cerebral infarction affecting unspecified side: Secondary | ICD-10-CM | POA: Diagnosis not present

## 2020-04-13 DIAGNOSIS — M48061 Spinal stenosis, lumbar region without neurogenic claudication: Secondary | ICD-10-CM

## 2020-04-13 DIAGNOSIS — M21371 Foot drop, right foot: Secondary | ICD-10-CM

## 2020-04-13 NOTE — Patient Instructions (Signed)
Plan: Avoid bending, stooping and avoid lifting weights greater than 10 lbs. Avoid prolong standing and walking. Avoid frequent bending and stooping  No lifting greater than 10 lbs. May use ice or moist heat for pain. Weight loss is of benefit. Handicap license is approved. Referral to Dr. Leonie Man for assessment of right L4 radiculopathy vs hemiparesis due to stroke 10/2019 Patient know to Dr. Leonie Man. Take the right ankle brace back to the company that made it to have it repaired and refurbished to fit and not cause pain. Follow-Up Instructions: Return in about 3 months (around 07/12/2020).

## 2020-04-13 NOTE — Progress Notes (Signed)
Office Visit Note   Patient: [name]           Date of Birth: 1933/10/23           MRN: [id number] Visit Date: 04/13/2020              Requested by: [name], [address] [place],  [place] [address] PCP: [name], MD   Assessment & Plan: Visit Diagnoses:  1. Basal ganglia stroke ([place])   2. Hip pain   3. Hemiparesis due to recent stroke ([place])   4. Spinal stenosis of lumbar region without neurogenic claudication   5. Foot drop, right     Plan: Avoid bending, stooping and avoid lifting weights greater than 10 lbs. Avoid prolong standing and walking. Avoid frequent bending and stooping  No lifting greater than 10 lbs. May use ice or moist heat for pain. Weight loss is of benefit. Handicap license is approved. Referral to Dr. Leonie Man for assessment of right L4 radiculopathy vs hemiparesis due to stroke 10/2019 Patient know to Dr. Leonie Man. Take the right ankle brace back to the company that made it to have it repaired and refurbished to fit and not cause pain. Follow-Up Instructions: Return in about 3 months (around 07/12/2020).   Orders:  Orders Placed This Encounter  Procedures  . Ambulatory referral to Neurology   No orders of the defined types were placed in this encounter.     Procedures: No procedures performed   Clinical Data: No additional findings.   Subjective: Chief Complaint  Patient presents with  . Lower Back - Follow-up    HPI  Review of Systems   Objective: Vital Signs: BP 110/71 (BP Location: Left Arm, Patient Position: Sitting)   Pulse 91   Ht 6' (1.829 m)   Wt 164 lb (74.4 kg)   BMI 22.24 kg/m   Physical Exam  Ortho Exam  Specialty Comments:  No specialty comments available.  Imaging: No results found.   PMFS History: Patient Active Problem List   Diagnosis Date Noted  . Situational depression 01/22/2020  . Urinary frequency 01/22/2020  . Bacteremia due to Staphylococcus  epidermidis 11/24/2019  . Stroke (cerebrum) ([place])   . Elevated troponin   . Closed right hip fracture ([place]) 11/15/2019  . Unilateral primary osteoarthritis, right knee 11/13/2019  . At high risk for falls 05/29/2019  . Chronic bilateral low back pain without sciatica 02/07/2019  . Statin intolerance 11/16/2018  . Hyperlipidemia, mixed 08/15/2018  . Low back pain 07/18/2018  . History of lacunar cerebrovascular accident (CVA) 02/12/2018  . Dizziness 02/12/2018  . Myofascial pain 12/18/2017  . Spondylosis without myelopathy or radiculopathy, lumbar region 12/18/2017  . Lumbar radiculopathy, right 12/18/2017  . Cholelithiasis 05/17/2017  . Sinus Bradycardia 05/17/2017  . Thoracic radiculopathy 04/12/2017  . Primary osteoarthritis of right knee 12/14/2016  . DDD (degenerative disc disease), lumbar 10/10/2016  . Lumbar facet arthropathy 08/29/2016  . Idiopathic scoliosis 03/22/2016  . Diabetic neuropathy ([place]) 03/22/2016  . Dysautonomia orthostatic hypotension syndrome 02/25/2016  . CKD stage 3 due to type 2 diabetes mellitus ([place]) 02/19/2016  . Coronary artery disease involving coronary bypass graft of native heart without angina pectoris   . Diabetes mellitus type 2, diet-controlled ([place])   . 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 Medication management 08/11/2015  . 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 PVD (peripheral vascular disease) ([place]) 01/01/2014  . Hyperlipidemia associated with type 2 diabetes mellitus ([place]) 04/11/2013  . Supine hypertension   .  Vitamin D deficiency   . Vitamin B 12 deficiency   . Coronary atherosclerosis- s/p PCI to LAD in 2009 and PCI to RCA in 2011 02/27/2009  . Abdominal aortic aneurysm ([place]) 02/27/2009  . GERD 02/27/2009   Past Medical History:  Diagnosis Date  . AAA (abdominal aortic aneurysm) ([place])   . 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 CAD (coronary artery disease)    a. s/p CABGx4 in 07/2015.  . Carotid artery disease ([place])   . Colon polyps   . Diabetes  ([place])   . Difficult intubation   . 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 Esophageal reflux   . High cholesterol   . History of IBS 02/27/2009  . Hypertension    pt denies, he says he has a h/o hypotension. If BP up he adjusts the Florinef  . Jejunitis with partial SBO 05/20/2017  . Orthostatic hypotension   . Prostatitis   . Stroke ([place])   . TIA (transient ischemic attack)   . Upper GI bleed 06/24/2018  . Vitamin B 12 deficiency   . Vitamin D deficiency     Family History  Problem Relation Age of Onset  . Heart attack Father        died age 78  . Heart attack Brother        died age 78  . Anuerysm Brother        aortic  . Heart attack Sister        died age 57  . Colon cancer Sister   . Liver cancer Sister   . Diabetes Maternal Grandmother   . Arthritis Mother   . Dementia Mother   . Heart Problems Brother   . Heart Problems Brother   . Heart Problems Brother   . Heart Problems Brother   . Healthy Daughter   . Healthy Son   . Esophageal cancer Neg Hx   . Inflammatory bowel disease Neg Hx   . Pancreatic cancer Neg Hx   . Stomach cancer Neg Hx     Past Surgical History:  Procedure Laterality Date  . BIOPSY  06/25/2018   Procedure: BIOPSY;  Surgeon: Rush Landmark Telford Nab., MD;  Location: [facility];  Service: Gastroenterology;;  . BIOPSY  04/24/2019   Procedure: BIOPSY;  Surgeon: Irving Copas., MD;  Location: [facility];  Service: Gastroenterology;;  . BLADDER SURGERY  1969   traumatic pelvic fractures, urethral and bladder repair  . CARDIAC CATHETERIZATION N/A 07/01/2015   Procedure: Left Heart Cath and Coronary Angiography;  Surgeon: Wellington Hampshire, MD;  Location: [facility];  Service: Cardiovascular;  Laterality: N/A;  . COLON RESECTION N/A 05/17/2017   Procedure: DIAGNOSTIC LAPAROSCOPY,;  Surgeon: Leighton Ruff, MD;  Location: [facility];  Service: General;  Laterality: N/A;  . COLONOSCOPY WITH PROPOFOL N/A 04/24/2019   Procedure: COLONOSCOPY  WITH PROPOFOL;  Surgeon: Irving Copas., MD;  [facility];  Service: Gastroenterology;  Laterality: N/A;  . CORONARY ANGIOPLASTY WITH STENT PLACEMENT    . CORONARY ARTERY BYPASS GRAFT N/A 07/06/2015   Procedure: CORONARY ARTERY BYPASS GRAFTING (CABG)x 4   utilizing the left internal mammary artery and endoscopically harvested bilateral  sapheneous vein.;  Surgeon: Ivin Poot, MD;  Location: [facility];  Service: Open Heart Surgery;  Laterality: N/A;  . ESOPHAGEAL DILATION  04/24/2019   Procedure: ESOPHAGEAL DILATION;  Surgeon: Rush Landmark Telford Nab., MD;  [facility];  Service: Gastroenterology;;  . ESOPHAGOGASTRODUODENOSCOPY (EGD) WITH PROPOFOL N/A 06/25/2018  Procedure: ESOPHAGOGASTRODUODENOSCOPY (EGD) WITH PROPOFOL;  Surgeon: Rush Landmark Telford Nab., MD;  Location: [facility];  Service: Gastroenterology;  Laterality: N/A;  . ESOPHAGOGASTRODUODENOSCOPY (EGD) WITH PROPOFOL N/A 04/24/2019   Procedure: ESOPHAGOGASTRODUODENOSCOPY (EGD) WITH PROPOFOL;  Surgeon: Rush Landmark Telford Nab., MD;  [facility];  Service: Gastroenterology;  Laterality: N/A;  . FEMUR IM NAIL Right 11/16/2019   Procedure: RIGHT HIP INTRAMEDULLARY (IM) NAIL FEMORAL;  Surgeon: Meredith Pel, MD;  Location: [facility];  Service: Orthopedics;  Laterality: Right;  . HEMOSTASIS CLIP PLACEMENT  04/24/2019   Procedure: HEMOSTASIS CLIP PLACEMENT;  Surgeon: Irving Copas., MD;  [facility];  Service: Gastroenterology;;  . KNEE SURGERY    . LOOP RECORDER INSERTION N/A 02/19/2018   Procedure: LOOP RECORDER INSERTION;  Surgeon: Deboraha Sprang, MD;  Location: [facility];  Service: Cardiovascular;  Laterality: N/A;  . POLYPECTOMY  04/24/2019   Procedure: POLYPECTOMY;  Surgeon: Rush Landmark Telford Nab., MD;  Location[facility];  Service: Gastroenterology;;  . Lia Foyer LIFTING INJECTION  04/24/2019   Procedure: SUBMUCOSAL LIFTING INJECTION;  Surgeon: Irving Copas., MD;  [facility];  Service: Gastroenterology;;  . TEE WITHOUT CARDIOVERSION N/A 07/06/2015   Procedure: TRANSESOPHAGEAL ECHOCARDIOGRAM (TEE);  Surgeon: Ivin Poot, MD;  Location: [facility];  Service: Open Heart Surgery;  Laterality: N/A;   Social History   Occupational History  . Occupation: Retired  Tobacco Use  . Smoking status: Former Smoker    Quit date: 07/16/1963    Years since quitting: 56.7  . Smokeless tobacco: Former Systems developer    Types: [place] date: 1989  Media planner  . Vaping Use: Never used  Substance and Sexual Activity  . Alcohol use: No  . Drug use: No  . Sexual activity: Not Currently

## 2020-04-19 ENCOUNTER — Other Ambulatory Visit: Payer: Self-pay | Admitting: Internal Medicine

## 2020-04-21 ENCOUNTER — Ambulatory Visit (INDEPENDENT_AMBULATORY_CARE_PROVIDER_SITE_OTHER): Payer: Medicare Other

## 2020-04-21 DIAGNOSIS — I6310 Cerebral infarction due to embolism of unspecified precerebral artery: Secondary | ICD-10-CM | POA: Diagnosis not present

## 2020-04-21 LAB — CUP PACEART REMOTE DEVICE CHECK
Date Time Interrogation Session: 20211221013736
Implantable Pulse Generator Implant Date: 20191021

## 2020-05-05 ENCOUNTER — Ambulatory Visit (INDEPENDENT_AMBULATORY_CARE_PROVIDER_SITE_OTHER): Payer: Medicare Other | Admitting: Internal Medicine

## 2020-05-05 ENCOUNTER — Encounter: Payer: Self-pay | Admitting: Internal Medicine

## 2020-05-05 ENCOUNTER — Other Ambulatory Visit: Payer: Self-pay

## 2020-05-05 VITALS — BP 122/72 | HR 97 | Temp 97.1°F | Resp 16

## 2020-05-05 DIAGNOSIS — M940 Chondrocostal junction syndrome [Tietze]: Secondary | ICD-10-CM

## 2020-05-05 MED ORDER — DEXAMETHASONE 4 MG PO TABS: ORAL_TABLET | ORAL | 0 refills | Status: DC

## 2020-05-05 NOTE — Progress Notes (Signed)
History of Present Illness:           This very nice 87 y.o.  MWM  with HTN,  ASCAD /CABG,  HLD, T2_DM (diet) and Vitamin D Deficiency. And hx/o intercostal Neuralgia presents with c/o Left sided chest wall pain. Patient currently om Meloxicam. Has taken prednisone tapers in past with improvement in sx's. Patient's pain is at rest as he is essentially non - ambulatory.     Medications    .  fludrocortisone (FLORINEF) 0.1 MG tablet,Take 0.1 mg by mouth daily.  Marland Kitchen  levothyroxine 25 MCG tablet, Take 1 tablet Daily  .  amLODipine 5 MG tablet, Take 1 tablet Daily for BP .  olmesartan  40 MG tablet, Take 40 mg by mouth daily as needed. .  pravastatin  40 MG tablet, Take     1 tablet     at Bedtime         for Cholesterol .  acetaminophen  500 MG tablet, Take 1,000 mg in the morning and at bedtime.  Marland Kitchen  aspirin EC 81 MG tablet, Take 2 tablets daily. Marland Kitchen  FIORICET, Take     1 tablet      every 4 hours       ONLY       if needed for Severe Headache .  meloxicam  15 MG tablet, Take 1/2 to 1 tablet Daily for Pain & Inflammation .  ferrous sulfate 325 (65 FE) MG tablet, Take 325 mg  daily with breakfast.):  .  buPROPion-XL 150 MG , Take 1 tablet every morning. Marland Kitchen  CALCIUM+D3 , Take 1 tablet  daily. Marland Kitchen  VITAMIN D 5000 units TABS, Take  daily. .  fluconazole  150 MG tablet, Take 1     tablet     2 x /week        (every 3 days)        for yeast infection .  glucose blood test strip, Check blood sugar 1 time daily-DX-E11.9 (Patient taking differently: 1 each by Other route as needed. Check blood sugar 1 time daily-DX-E11.9) .  Multiple Vitamin  , Take 1 tablet  daily. .  pantoprazole 40 MG tablet, Take      1 tablet      Daily       to Prevent Indigestion & Heartburn .  polyethylene glycol  packet, Take 17 g   2 times daily.  .  pregabalin  50 MG capsule, Take 3 times daily. .  tamsulosin  0.4 MG CAPS capsule, Take1 tablet 1 hour Before Bedtime    Problem list He has Coronary atherosclerosis- s/p PCI  to LAD in 2009 and PCI to RCA in 2011; Abdominal aortic aneurysm ([place]); GERD; Supine hypertension; Vitamin D deficiency; Vitamin B 12 deficiency; Hyperlipidemia associated with type 2 diabetes mellitus ([place]); PVD (peripheral vascular disease) ([place]); S/P CABG x 4; Medication management; Intercostal neuralgia; Coronary artery disease involving coronary bypass graft of native heart without angina pectoris; Diabetes mellitus type 2, diet-controlled ([place]); CKD stage 3 due to type 2 diabetes mellitus ([place]); Dysautonomia orthostatic hypotension syndrome; Idiopathic scoliosis; Diabetic neuropathy ([place]); Lumbar facet arthropathy; DDD (degenerative disc disease), lumbar; Primary osteoarthritis of right knee; Thoracic radiculopathy; Cholelithiasis; Sinus Bradycardia; Myofascial pain; Spondylosis without myelopathy or radiculopathy, lumbar region; Lumbar radiculopathy, right; History of lacunar cerebrovascular accident (CVA); Dizziness; Low back pain; Hyperlipidemia, mixed; Statin intolerance; Chronic bilateral low back pain without sciatica; At high risk for falls; Unilateral primary osteoarthritis, right knee; Closed right  hip fracture (HCC); Elevated troponin; Stroke (cerebrum) (HCC); Bacteremia due to Staphylococcus epidermidis; Situational depression; and Urinary frequency on their problem list.   Observations/Objective:  BP 122/72   Pulse 97   Temp (!) 97.1 F (36.2 C)   Resp 16   SpO2 97%   HEENT - WNL. Neck - supple.  Chest - Clear equal BS. Exquisite tenderness of left chestwall Cor - Nl HS. RRR w/o sig MGR. PP 1(+). No edema. Abd - Soft. Benign. Non-tender. MS- FROM w/o deformities.   Non ambulatory in wheelchair.  Neuro -  Nl w/o focal abnormalities.  Assessment and Plan:  1. Costochondritis  - dexamethasone (DECADRON) 4 MG tablet; Take 1 tab 3 x day - 3 days, then 2 x day - 3 days, then 1 tab daily  Dispense: 20 tablet; Refill: 0  - Advised hold meloxicam while on Decadron.  - Advised take  his Tylenol 650 mg x 2 tabs 3 x /day with meals .   Follow Up Instructions:        I discussed the assessment and treatment plan with the patient. The patient was provided an opportunity to ask questions and all were answered. The patient agreed with the plan and demonstrated an understanding of the instructions.       The patient was advised to call back or seek an in-person evaluation if the symptoms worsen or if the condition fails to improve as anticipated.   Marinus Maw, MD

## 2020-05-06 NOTE — Progress Notes (Signed)
Carelink Summary Report / Loop Recorder 

## 2020-05-15 ENCOUNTER — Other Ambulatory Visit: Payer: Self-pay | Admitting: Internal Medicine

## 2020-05-22 ENCOUNTER — Ambulatory Visit (INDEPENDENT_AMBULATORY_CARE_PROVIDER_SITE_OTHER): Payer: Medicare Other

## 2020-05-22 DIAGNOSIS — I639 Cerebral infarction, unspecified: Secondary | ICD-10-CM | POA: Diagnosis not present

## 2020-05-22 LAB — CUP PACEART REMOTE DEVICE CHECK
Date Time Interrogation Session: 20220121014259
Implantable Pulse Generator Implant Date: 20191021

## 2020-05-27 ENCOUNTER — Other Ambulatory Visit: Payer: Self-pay | Admitting: Internal Medicine

## 2020-05-27 ENCOUNTER — Encounter: Payer: Self-pay | Admitting: Neurology

## 2020-05-27 ENCOUNTER — Ambulatory Visit: Payer: Medicare Other | Admitting: Neurology

## 2020-05-27 VITALS — BP 157/97 | HR 97

## 2020-05-27 DIAGNOSIS — R269 Unspecified abnormalities of gait and mobility: Secondary | ICD-10-CM | POA: Diagnosis not present

## 2020-05-27 DIAGNOSIS — R413 Other amnesia: Secondary | ICD-10-CM

## 2020-05-27 DIAGNOSIS — R29898 Other symptoms and signs involving the musculoskeletal system: Secondary | ICD-10-CM | POA: Diagnosis not present

## 2020-05-27 DIAGNOSIS — M7061 Trochanteric bursitis, right hip: Secondary | ICD-10-CM

## 2020-05-27 MED ORDER — MEMANTINE HCL 10 MG PO TABS: 10.0000 mg | ORAL_TABLET | Freq: Two times a day (BID) | ORAL | 3 refills | Status: DC

## 2020-05-27 MED ORDER — MEMANTINE HCL 28 X 5 MG & 21 X 10 MG PO TABS: ORAL_TABLET | ORAL | 12 refills | Status: DC

## 2020-05-27 NOTE — Progress Notes (Signed)
[facility] [address] [place]. [place] [address] [phone]       OFFICE FOLLOW UP VISIT NOTE  [name] Date of Birth:  10-10-1933 Medical Record Number:  [id number]   Referring MD:  Basil Dess  Reason for Referral:  stroke HPI: initial visit 10/03/2019 [name] is a pleasant 87 year old Caucasian male with past medical history of peripheral vascular disease, coronary artery disease, diabetes, hypertension, hyperlipidemia, orthostatic hypotension, vitamin B12 deficiency, arthritis with multiple strokes who is seen today for a consultation visit accompanied by his wife.  History is obtained from them and review of electronic medical records and I personally reviewed imaging films in PACS.  Patient was recently seen in the emergency room on 09/25/2019 with an episode of altered mental status, confusion and EMS apparently noticed left upper extremity drift.  Patient was been treated for UTI with antibiotics.  His symptoms resolved.  CT scan was unremarkable MRI scan of the brain was obtained which I personally reviewed showed no acute abnormalities.  Old bilateral basal ganglia and cerebellar lacunar infarcts are noted.  He has subsequently been referred to urology but the referral is pending.  Patient complains of dizziness which is constant and seems to be more aggravated with upright position.  His blood pressure tends to run on the low side he is on Florinef.  Patient had a fall last month and sustained a pelvic fracture he is in pain and does walk with a walker.  He also has chronic diabetic neuropathy and takes Lyrica which seems to help neuropathic pain but he does have imbalance from this.  He is also had multiple strokes and was previously seen in our practice in 2019 for right temporal stroke as well as subsequently later followed corpus callosum lacunar infarct.  He also has mild baseline dementia which as per the wife is stable.  He is presently on aspirin  for stroke prevention and tolerating it well.  His blood pressure seems under good control and tends to run low.  His diabetes control has also been satisfactory though has not had recent A1c of lipid profile that I can see in the computer.  He was last seen in the practice on 12/03/2018 by Claris Gower nurse practitioner.    Update 05/27/2020 : He returns for follow-up after last visit in June 2021.  Is accompanied by his wife.  Patient was admitted for hip surgery in July 2021 to [facility] and postop developed strokes.  His level right-sided weakness and MRI scan showed multiple embolic strokes involving the right cerebellum, left parietal lobe.  2D echo showed normal ejection fraction without cardiac source of embolism.  LDL cholesterol 59 mg percent.  Hemoglobin A1c was 5.1.  Loop interrogation did not reveal evidence of A. fib.  Carotid Doppler was not done as previously in June 2021 and showed bilateral 40 to 59% stenosis.  His hospital course was complicated by confusion and encephalopathy which is related to cystitis.  He was treated with antibiotics.  Started on Xarelto for hip surgery post postop prophylaxis.  Patient has made gradual improvement but is still not able to walk back to baseline.  His balance is poor.  He does use a walker but does not walk a lot as per his wife.  She is also noticed decreased short-term memory and cognitive impairment which is more pronounced now.  Patient is also having some vision difficulty with increased watering of his eyes and plans to see an  eye doctor next month.  He is on aspirin she is tolerating well without bruising or bleeding.  His blood pressure is under good control at home but today slightly elevated in office at 157/97..  Is tolerating Pravachol well without side effects. ROS:   14 system review of systems is positive for memory loss, decreased vision, watering of the eyes altered mental status, weakness, paresthesias, imbalance, urinary retention all  other systems negative  PMH:  Past Medical History:  Diagnosis Date  . AAA (abdominal aortic aneurysm) ([place])   . 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 CAD (coronary artery disease)    a. s/p CABGx4 in 07/2015.  . Carotid artery disease ([place])   . Colon polyps   . Diabetes ([place])   . Difficult intubation   . 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 Esophageal reflux   . High cholesterol   . History of IBS 02/27/2009  . Hypertension    pt denies, he says he has a h/o hypotension. If BP up he adjusts the Florinef  . Jejunitis with partial SBO 05/20/2017  . Orthostatic hypotension   . Prostatitis   . Stroke ([place])   . TIA (transient ischemic attack)   . Upper GI bleed 06/24/2018  . Vitamin B 12 deficiency   . Vitamin D deficiency     Social History:  Social History   Socioeconomic History  . Marital status: Married    Spouse name: Estill Bamberg  . Number of children: 2  . Years of education: Not on file  . Highest education level: High school graduate  Occupational History  . Occupation: Retired  Tobacco Use  . Smoking status: Former Smoker    Quit date: 07/16/1963    Years since quitting: 56.9  . Smokeless tobacco: Former Systems developer    Types: Simpson date: 1989  Media planner  . Vaping Use: Never used  Substance and Sexual Activity  . Alcohol use: No  . Drug use: No  . Sexual activity: Not Currently  Other Topics Concern  . Not on file  Social History Narrative   Lives at home with his wife Estill Bamberg   Right handed   No caffeine   Social Determinants of Radio broadcast assistant Strain: Not on file  Food Insecurity: Not on file  Transportation Needs: Not on file  Physical Activity: Not on file  Stress: Not on file  Social Connections: Not on file  Intimate Partner Violence: Not on file    Medications:   Current Outpatient Medications on File Prior to Visit  Medication Sig Dispense Refill  . acetaminophen (TYLENOL) 500 MG tablet Take 1,000 mg by mouth in the morning and at  bedtime.    Marland Kitchen amLODipine (NORVASC) 5 MG tablet Take 1 tablet Daily for BP 90 tablet 3  . aspirin EC 81 MG tablet Take 2 tablets (162 mg total) by mouth daily.    Marland Kitchen buPROPion (WELLBUTRIN XL) 150 MG 24 hr tablet Take 1 tablet (150 mg total) by mouth every morning. 90 tablet 1  . butalbital-acetaminophen-caffeine (FIORICET) 50-325-40 MG tablet Take     1 tablet      every 4 hours       ONLY       if needed for Severe Headache 30 tablet 0  . Calcium Carb-Cholecalciferol (CALCIUM+D3 PO) Take 1 tablet by mouth daily.    . Cholecalciferol (VITAMIN D-3) 5000 units TABS Take 5,000 Units by mouth daily.    Marland Kitchen  fludrocortisone (FLORINEF) 0.1 MG tablet Take 2 tablets Daily for Low BP (Patient taking differently: Take 0.1 mg by mouth daily. Daily for Low BP) 180 tablet 0  . glucose blood test strip Check blood sugar 1 time daily-DX-E11.9 (Patient taking differently: 1 each by Other route as needed. Check blood sugar 1 time daily-DX-E11.9) 100 each 5  . levothyroxine (SYNTHROID) 25 MCG tablet Take 1 tablet     Daily      on an empty stomach with only water for 30 minutes & no Antacid meds, Calcium or Magnesium for 4 hours & avoid Biotin 90 tablet 0  . Multiple Vitamin (MULTIVITAMIN WITH MINERALS) TABS tablet Take 1 tablet by mouth daily.    Marland Kitchen olmesartan (BENICAR) 40 MG tablet Take 40 mg by mouth daily as needed.    . pantoprazole (PROTONIX) 40 MG tablet Take      1 tablet      Daily       to Prevent Indigestion & Heartburn 90 tablet 3  . polyethylene glycol (MIRALAX / GLYCOLAX) packet Take 17 g by mouth daily. (Patient taking differently: Take 17 g by mouth 2 (two) times daily.) 28 each 1  . pravastatin (PRAVACHOL) 40 MG tablet Take     1 tablet     at Bedtime         for Cholesterol 90 tablet 1  . tamsulosin (FLOMAX) 0.4 MG CAPS capsule TAKE 1 TABLET 1 HOUR BEFORE BEDTIME FOR BLADDER EMPTYING 90 capsule 1  . Zinc 50 MG TABS Take 1 tablet by mouth daily.    Marland Kitchen dexamethasone (DECADRON) 4 MG tablet Take 1 tab 3 x day  - 3 days, then 2 x day - 3 days, then 1 tab daily 20 tablet 0  . ferrous sulfate 325 (65 FE) MG tablet Take 325 mg by mouth daily with breakfast.    . fluconazole (DIFLUCAN) 150 MG tablet Take 1     tablet     2 x /week        (every 3 days)        for yeast infection 8 tablet 0   No current facility-administered medications on file prior to visit.    Allergies:   Allergies  Allergen Reactions  . Other Other (See Comments)    Pt reports allergic to a medication but unsure of name or type of med  . Cymbalta [Duloxetine Hcl] Other (See Comments)    Dizziness, hallucinations.   . Keflex [Cephalexin] Other (See Comments)    dizziness  . Simvastatin Other (See Comments)    Joint pain  . Sudafed [Pseudoephedrine] Other (See Comments)    Dizziness    Physical Exam General: well developed, well nourished elderly Caucasian male, seated, in no evident distress Head: head normocephalic and atraumatic.   Neck: supple with no carotid or supraclavicular bruits Cardiovascular: regular rate and rhythm, no murmurs Musculoskeletal: no deformity Skin:  no rash/petichiae Vascular:  Normal pulses all extremities  Neurologic Exam Mental Status: Awake and fully alert. Oriented to place and time. Recent and remote memory intact. Attention span, concentration and fund of knowledge appropriate. Mood and affect appropriate.  Diminished recall 1/3.  Able to name only 4 animals which can walk on 4 legs.  Clock drawing 3/4.  Mini-Mental status exam score 22/30 with deficits in attention and recall.  Difficulty with copying intersecting pentagons. Cranial Nerves: Fundoscopic exam not done. Pupils equal, briskly reactive to light. Extraocular movements full without nystagmus. Visual fields full to  confrontation. Hearing diminished slightly bilaterally. Facial sensation intact.  Mild right nasolabial fold weakness., tongue, palate moves normally and symmetrically.  Motor: Normal bulk and tone. Normal strength in  all tested extremity muscles except significant weakness of right hip muscles likely from pain with limitation of hip flexion and abduction and adduction only.. Sensory.:  Diminished touch , pinprick , position and vibratory sensation from knee down bilaterally..  Coordination: Rapid alternating movements normal in all extremities. Finger-to-nose and heel-to-shin performed accurately bilaterally. Gait and Station: Deferred as patient did not bring his walker and is in a wheelchair.  Reflexes: 1+ and symmetric except both ankle jerks are depressed. Toes downgoing.   NIHSS  0 Modified Rankin  2   ASSESSMENT: 87 year old Caucasian male with transient episode of altered mental status with left upper extremity weakness possibly right brain subcortical TIA from small vessel disease in May 2021.Marland Kitchen  Prior history of right temporal infarct in September 2019 and left corpus callosal lacunar infarct in October 2019 and remote H chronic lacunar infarcts involving right basal ganglia, cerebellum and corona radiator.  Vascular risk factors of hypertension, diabetes, hyperlipidemia and cerebrovascular disease.  He also has chronic diabetic peripheral polyneuropathy.  Multiple right cerebellar and small left parietal embolic infarcts in July 2021 likely from fat embolism following hip surgery.  Patient also had lethargy and altered mental status which was felt to be related to encephalopathy postop.     PLAN: I had a long discussion with the patient and his wife regarding his recent stroke and worsening gait difficulties which are likely multifactorial due to combination of multiple strokes, recent hip surgery and underlying neuropathy and degenerative spine disease.  He also had worsening memory and cognitive impairment likely due to mild vascular dementia.  Recommend checking lab work for reversible causes of memory loss, EEG and a trial of Namenda for cognitive improvement.  Referral to outpatient physical and  occupational therapy as  Outpatient.  Continue aspirin for stroke prevention and maintain aggressive risk factor modification with strict control of hypertension with blood pressure goal below 130/90, lipids with LDL cholesterol goal below 70 mg percent and diabetes with hemoglobin A1c goal below 6.5%.  Return for follow-up in 3 months or call earlier if necessary. Greater than 50% time during this 45-minute prolonged visit was spent on counseling and coordination of care about TIA and stroke and answering questions about stroke prevention. Antony Contras, MD  [facility] [address] [place], [place] [address]  Phone [phone] Fax [phone] Note: This document was prepared with digital dictation and possible smart phrase technology. Any transcriptional errors that result from this process are unintentional.

## 2020-05-27 NOTE — Patient Instructions (Signed)
I had a long discussion with the patient and his wife regarding his recent stroke and worsening gait difficulties which are likely multifactorial due to combination of multiple strokes, recent hip surgery and underlying neuropathy and degenerative spine disease.  He also had worsening memory and cognitive impairment likely due to mild vascular dementia.  Recommend checking lab work for reversible causes of memory loss, EEG and a trial of Namenda for cognitive improvement.  Referral to outpatient physical and occupational therapy as  Outpatient.  Continue aspirin for stroke prevention and maintain aggressive risk factor modification with strict control of hypertension with blood pressure goal below 130/90, lipids with LDL cholesterol goal below 70 mg percent and diabetes with hemoglobin A1c goal below 6.5%.  Return for follow-up in 3 months or call earlier if necessary.

## 2020-05-28 LAB — DEMENTIA PANEL
Homocysteine: 14.1 umol/L (ref 0.0–21.3)
RPR Ser Ql: NONREACTIVE
TSH: 3.45 u[IU]/mL (ref 0.450–4.500)
Vitamin B-12: 1417 pg/mL — ABNORMAL HIGH (ref 232–1245)

## 2020-06-01 ENCOUNTER — Ambulatory Visit: Payer: Medicare Other | Admitting: Adult Health

## 2020-06-02 LAB — HM DIABETES EYE EXAM

## 2020-06-02 NOTE — Progress Notes (Signed)
Kindly advise the patient that lab work for reversible causes of memory loss was all normal

## 2020-06-03 ENCOUNTER — Telehealth: Payer: Self-pay | Admitting: *Deleted

## 2020-06-03 NOTE — Telephone Encounter (Signed)
Spoke with patient's wife [name] (on [place]). Discussed labs for reversible causes of  memory loss were normal. Pt verbalized appreciation. She stated she wanted the therapy referrals sent to [facility] on [address] as pt has been there before and did well. I advised I would send a message request. She verbalized appreciation.

## 2020-06-03 NOTE — Progress Notes (Signed)
Carelink Summary Report / Loop Recorder 

## 2020-06-09 ENCOUNTER — Ambulatory Visit (INDEPENDENT_AMBULATORY_CARE_PROVIDER_SITE_OTHER): Payer: Medicare Other | Admitting: Adult Health Nurse Practitioner

## 2020-06-09 ENCOUNTER — Other Ambulatory Visit: Payer: Self-pay

## 2020-06-09 ENCOUNTER — Encounter: Payer: Self-pay | Admitting: Adult Health Nurse Practitioner

## 2020-06-09 VITALS — BP 150/80 | HR 83 | Temp 97.9°F

## 2020-06-09 DIAGNOSIS — R0989 Other specified symptoms and signs involving the circulatory and respiratory systems: Secondary | ICD-10-CM | POA: Diagnosis not present

## 2020-06-09 DIAGNOSIS — R6889 Other general symptoms and signs: Secondary | ICD-10-CM

## 2020-06-09 DIAGNOSIS — Z0001 Encounter for general adult medical examination with abnormal findings: Secondary | ICD-10-CM

## 2020-06-09 NOTE — Progress Notes (Signed)
MEDICARE ANNUAL WELLNESS VISIT AND FOLLOW UP  Assessment:   [name] was seen today for follow-up and medicare wellness.  Diagnoses and all orders for this visit:  Diabetes mellitus type 2, diet-controlled ([name]) Has been controlled in prediabetic range with lifstyle Continue diet and exercise.  Perform daily foot/skin check, notify office of any concerning changes.  -     Hemoglobin A1c  CKD (chronic kidney disease), stage III (HCC) Increase fluids, avoid NSAIDS, monitor sugars, will monitor -     CMP/GFR  Vitamin D deficiency At goal at recent check; continue to recommend supplementation for goal of 60-100 Defer vitamin D level  Mixed hyperlipidemia statin intolerant, failed repatha and numerous others via cardiology, declines further Continue low cholesterol diet and exercise.  Check lipid panel.  -     Lipid panel -     TSH  Medication management -     CBC with Differential/Platelet -     CMP/GFR -     Magnesium   Hx of Lacunar CVA Continue ASA, plavix Control blood pressure, cholesterol, glucose, increase exercise.  Follow up neurology as recommended  Dysautonomia orthostatic hypotension syndrome (HCC) Well controlled with current medications at home - florinef Monitor blood pressure at home - standing BPs only; patient to call if consistently greater than 130/80 or with very low readings below 90/60 Reminder to go to the ER if any CP, SOB, nausea, dizziness, severe HA, changes vision/speech, left arm numbness and tingling and jaw pain.  Gastroesophageal reflux disease, esophagitis presence not specified Well managed on current medications; continue PPI due to recent UGIB Discussed diet, avoiding triggers and other lifestyle changes  Diabetic polyneuropathy associated with other specified diabetes mellitus ([place]) Continue close monitoring of blood glucose, patient checks skin/feet daily, plan in place for constipation  CKD 3 Increase fluids, avoid NSAIDS, monitor  sugars, will monitor - CMP/GFR  Abdominal aortic aneurysm (AAA) without rupture ([place]) Followed by cardiology and vascular, AAA screening US annually   Coronary artery disease involving coronary bypass graft of native heart without angina pectoris Continue follow up with cardiology Continue close monitoring of blood pressure, cholesterol, blood glucose - continue ASA  PVD (peripheral vascular disease) ([place]) Continue follow up with cardiology Continue close monitoring of blood pressure, cholesterol, blood glucose - continue ASA  Other idiopathic scoliosis, lumbar region Continue f/u with ortho  Vitamin B 12 deficiency On supplementation; monitor B12 levels routinely at CPE Check CBC  S/P CABG x 4 Continue follow up cardiology  DDD (degenerative disc disease), lumbar Continue follow up spine/pain management  High fall risk Fall prevention discussed; currently doing PT; using walker/scooter at home Reports no falls since off of cymbalta  Prostatitis On day 10 of augmentin; continue 20 more days for 4 weeks Recheck UA, CRP, CMP/GFR   Over 30 minutes of exam, counseling, chart review, and critical decision making was performed  Future Appointments  Date Time Provider [place]  06/22/2020 12:25 PM [facility] DEVICE REMOTES [facility]  06/23/2020  1:00 PM Isaias Cowman, PT [facility] None  06/25/2020  1:00 PM Isaias Cowman, PT [facility] None  06/30/2020  2:30 PM [name], PT [facility] None  07/02/2020  1:00 PM [name], PT [facility] None  07/07/2020  1:00 PM [name], PT [facility] None  07/09/2020  1:00 PM [name], PT [facility] None  07/14/2020  1:00 PM [name], PT [facility] None  07/16/2020  1:00 PM [name], PT [facility] None  07/21/2020  1:45 PM Isaias Cowman, PT [facility] None  07/23/2020 12:25 PM [facility] DEVICE REMOTES [facility]  07/23/2020  1:00 PM Isaias Cowman, PT [facility] None  07/28/2020  1:00 PM Elsie Ra  R, PT [facility] None  07/30/2020  1:00 PM [name], PT [facility] None  08/18/2020  3:40 PM Lelon Perla, MD [facility]  08/24/2020 12:25 PM [facility] DEVICE REMOTES [facility]  09/15/2020  2:30 PM Garvin Fila, MD [facility] None  09/24/2020 12:25 PM [facility] DEVICE REMOTES [facility]  10/14/2020 10:00 AM [name], MD [facility] None  10/26/2020 12:25 PM [facility] DEVICE REMOTES [facility]  11/26/2020 12:25 PM [facility] DEVICE REMOTES [facility]  12/28/2020 12:25 PM [facility] DEVICE REMOTES [facility]  01/28/2021 12:25 PM [facility] DEVICE REMOTES [facility]  03/01/2021 12:25 PM [facility] DEVICE REMOTES [facility]  04/01/2021 12:25 PM [facility] DEVICE REMOTES [facility]  06/03/2021 12:25 PM [facility] DEVICE REMOTES [facility]  06/09/2021 11:30 AM Yaretsi Humphres, NP [facility] None  07/05/2021 12:25 PM [facility] DEVICE REMOTES [facility]  08/05/2021 12:25 PM [facility] DEVICE REMOTES [facility]  09/06/2021 12:25 PM [facility] DEVICE REMOTES [facility]  10/07/2021 12:25 PM [facility] DEVICE REMOTES [facility]  11/08/2021 12:25 PM [facility] DEVICE REMOTES [facility]  12/09/2021 12:25 PM [facility] DEVICE REMOTES [facility]  01/10/2022 12:25 PM [facility] DEVICE REMOTES [facility]  02/10/2022 12:25 PM [facility] DEVICE REMOTES [facility]  03/14/2022 12:25 PM [facility] DEVICE REMOTES [facility]  04/14/2022 12:25 PM [facility] DEVICE REMOTES [facility]     Plan:   During the course of the visit the patient was educated and counseled about appropriate screening and preventive services including:    Pneumococcal vaccine   Influenza vaccine  Prevnar 13  Td vaccine  Screening  electrocardiogram  Colorectal cancer screening  Diabetes screening  Glaucoma screening  Nutrition counseling    Subjective:  [name] is a 87 y.o. male who presents for Medicare Annual Wellness Visit and 3 month follow up for HTN, HLD, type 2 diabetes, and vitamin D Def.  He is accompanied with his wife today.  Reports overall he is doing well.  No health or medication concerns today.  Patient was hospitalized in Feb 2020 with GI bleed due to duodenal ulcers and required transfusion 2 units of pRBC's. He underwent follow up EGD/colonoscopies on 04/24/2019 which showed resolved ulcers but with 5 polyps, non-cancerous, no further follow up but recommended continue daily PPI.  He presented with UTI/prostatitis sx on 05/15/2019, culture showed enterococcus faecalis, was initiated on amoxicillin TID, reports doing well with symptoms fully resolved. Currently on day 10 of treatment with plan to continue 4 weeks.   He continues to follow up with pain management and ortho for ongoing back pain; he has complex chronic back and thoracic radicular pain history, was followed by Dr. Tessa Lerner but retired and now following with Dr. Louanne Skye. He had ablation by Dr. Ernestina Patches 03/19/2020 which improved upper back pain but with ongoing lower back R hip, doing PT, uses walker and scooter at home. Using tylenol and gabapentin for pain.   Last year he was having disabling dizziness and MRI in 02/23/2018 found a small acute Rt Temporal stem CVA and he was started on Plavix w/his LD bASA. Patient had consultation by Dr Stanford Breed and had ECHO and Holter monitoring. Dr Jaynee Eagles and she recommended continuing Plavix, & strict control of  blood sugar and Lipids.   BMI is There is no height or  weight on file to calculate BMI., he has been working on diet and exercise, has been cutting portions,  Wt Readings from Last 3 Encounters:  04/13/20 164 lb (74.4 kg)  04/01/20 164 lb (74.4 kg)  03/06/20 164 lb (74.4 kg)    Patient has long hx/o labile  HTN predates since 1995. In 2005 and 2011, he underwent PCA/Stents and then CABG in 2017 and since then his HTN has evolved into orthostatic Hypotension with supine Hypertension.  His wife closely monitors his standing BP's & accordingly regulates his dosing of Florinef.  Has loop recorder per Dr. Stanford Breed.  He has AAA followed annually by Dr. Stanford Breed, recently referred to vascular due to interval left iliac arterial enlargement and was referred to Dr. Scot Dock who plans to monitor.  His blood pressure has been controlled at home (130s/70s), today their BP is BP: (!) 150/80 He does workout. He denies chest pain, shortness of breath.   He is not on cholesterol medication, was on repatha and numerous others via cardiology. Patient currently taking pravastatin 40mg  daily.Marland Kitchen His cholesterol is not at goal. The cholesterol last visit was:   Lab Results  Component Value Date   CHOL 131 03/05/2020   HDL 36 (L) 03/05/2020   LDLCALC 77 03/05/2020   TRIG 94 03/05/2020   CHOLHDL 3.6 03/05/2020   He has been working on diet and exercise for diet-controlled T2 diabetes, he is on ASA, ARB, statin intolerance and denies increased appetite, nausea, polydipsia, polyuria, visual disturbances and vomiting. He does have glucometer, fasting reportedly ranges 128-140. Last A1C in the office was:  Lab Results  Component Value Date   HGBA1C 5.5 03/05/2020   Last GFR Lab Results  Component Value Date   GFRNONAA 35 (L) 06/09/2020    Patient is on Vitamin D supplement and at goal:    Lab Results  Component Value Date   VD25OH 100 03/05/2020        Medication Review: Current Outpatient Medications on File Prior to Visit  Medication Sig Dispense Refill  . acetaminophen (TYLENOL) 500 MG tablet Take 1,000 mg by mouth in the morning and at bedtime.    Marland Kitchen amLODipine (NORVASC) 5 MG tablet Take 1 tablet Daily for BP 90 tablet 3  . aspirin EC 81 MG tablet Take 2 tablets (162 mg  total) by mouth daily.    Marland Kitchen buPROPion (WELLBUTRIN XL) 150 MG 24 hr tablet Take 1 tablet (150 mg total) by mouth every morning. 90 tablet 1  . butalbital-acetaminophen-caffeine (FIORICET) 50-325-40 MG tablet Take     1 tablet      every 4 hours       ONLY       if needed for Severe Headache 30 tablet 0  . Calcium Carb-Cholecalciferol (CALCIUM+D3 PO) Take 1 tablet by mouth daily.    . Cholecalciferol (VITAMIN D-3) 5000 units TABS Take 5,000 Units by mouth daily.    Marland Kitchen dexamethasone (DECADRON) 4 MG tablet Take 1 tab 3 x day - 3 days, then 2 x day - 3 days, then 1 tab daily 20 tablet 0  . ferrous sulfate 325 (65 FE) MG tablet Take 325 mg by mouth daily with breakfast.    . fludrocortisone (FLORINEF) 0.1 MG tablet Take 2 tablets Daily for Low BP (Patient taking differently: Take 0.1 mg by mouth daily. Daily for Low BP) 180 tablet 0  . glucose blood test strip Check blood sugar 1 time daily-DX-E11.9 (Patient taking differently: 1 each by Other route as  needed. Check blood sugar 1 time daily-DX-E11.9) 100 each 5  . levothyroxine (SYNTHROID) 25 MCG tablet Take 1 tablet     Daily      on an empty stomach with only water for 30 minutes & no Antacid meds, Calcium or Magnesium for 4 hours & avoid Biotin 90 tablet 0  . meloxicam (MOBIC) 15 MG tablet TAKE 1/2 TO 1 TABLET BY MOUTH ONCE DAILY AS NEEDED FOR PAIN AND INFLAMMATION. TAKE WITH FOOD. 90 tablet 0  . memantine (NAMENDA TITRATION PAK) tablet pack 5 mg/day for =1 week; 5 mg twice daily for =1 week; 15 mg/day given in 5 mg and 10 mg separated doses for =1 week; then 10 mg twice daily 49 tablet 12  . memantine (NAMENDA) 10 MG tablet Take 1 tablet (10 mg total) by mouth 2 (two) times daily. 60 tablet 3  . Multiple Vitamin (MULTIVITAMIN WITH MINERALS) TABS tablet Take 1 tablet by mouth daily.    Marland Kitchen olmesartan (BENICAR) 40 MG tablet Take 40 mg by mouth daily as needed.    . pantoprazole (PROTONIX) 40 MG tablet Take      1 tablet      Daily       to Prevent  Indigestion & Heartburn 90 tablet 3  . polyethylene glycol (MIRALAX / GLYCOLAX) packet Take 17 g by mouth daily. (Patient taking differently: Take 17 g by mouth 2 (two) times daily.) 28 each 1  . pravastatin (PRAVACHOL) 40 MG tablet Take     1 tablet     at Bedtime         for Cholesterol 90 tablet 1  . tamsulosin (FLOMAX) 0.4 MG CAPS capsule TAKE 1 TABLET 1 HOUR BEFORE BEDTIME FOR BLADDER EMPTYING 90 capsule 1  . Zinc 50 MG TABS Take 1 tablet by mouth daily.     No current facility-administered medications on file prior to visit.    Allergies: Allergies  Allergen Reactions  . Other Other (See Comments)    Pt reports allergic to a medication but unsure of name or type of med  . Cymbalta [Duloxetine Hcl] Other (See Comments)    Dizziness, hallucinations.   . Keflex [Cephalexin] Other (See Comments)    dizziness  . Simvastatin Other (See Comments)    Joint pain  . Sudafed [Pseudoephedrine] Other (See Comments)    Dizziness    Current Problems (verified) has Coronary atherosclerosis- s/p PCI to LAD in 2009 and PCI to RCA in 2011; Abdominal aortic aneurysm ([place]); GERD; Supine hypertension; Vitamin D deficiency; Vitamin B 12 deficiency; Hyperlipidemia associated with type 2 diabetes mellitus ([place]); PVD (peripheral vascular disease) ([place]); S/P CABG x 4; Medication management; Intercostal neuralgia; Coronary artery disease involving coronary bypass graft of native heart without angina pectoris; Diabetes mellitus type 2, diet-controlled ([place]); CKD stage 3 due to type 2 diabetes mellitus ([place]); Dysautonomia orthostatic hypotension syndrome; Idiopathic scoliosis; Diabetic neuropathy ([place]); Lumbar facet arthropathy; DDD (degenerative disc disease), lumbar; Primary osteoarthritis of right knee; Thoracic radiculopathy; Cholelithiasis; Sinus Bradycardia; Myofascial pain; Spondylosis without myelopathy or radiculopathy, lumbar region; Lumbar radiculopathy, right; History of lacunar cerebrovascular accident  (CVA); Dizziness; Low back pain; Hyperlipidemia, mixed; Statin intolerance; Chronic bilateral low back pain without sciatica; At high risk for falls; Unilateral primary osteoarthritis, right knee; Closed right hip fracture ([place]); Elevated troponin; Stroke (cerebrum) ([place]); Bacteremia due to Staphylococcus epidermidis; Situational depression; and Urinary frequency on their problem list.  Screening Tests Immunization History  Administered Date(s) Administered  . Influenza, High Dose Seasonal PF  12/30/2013, 02/02/2015, 01/27/2016, 01/29/2017, 01/31/2018, 02/20/2019, 03/05/2020  . Influenza-Unspecified 01/13/2013, 01/29/2017, 01/30/2018  . PFIZER(Purple Top)SARS-COV-2 Vaccination 05/21/2019, 06/11/2019, 02/15/2020  . Pneumococcal Conjugate-13 02/14/2014  . Pneumococcal-Unspecified 06/15/2004  . Tetanus 10/01/2012  . Zoster 10/01/2012   Preventative care:  Last colonoscopy: 04/2019, DONE Last EGD: 04/2019  CTA head 04/2018 AAA [place] 01/2019 - followed annually by Dr. Stanford Breed  Prior vaccinations: TD or Tdap: 2014         Influenza: 01/2019        Pneumococcal: 2006, DUE, discussed, next OV? [id number]: [id number] Shingles/Zostavax: 2014  Last eye exam: [place] [facility] - 04/17/2020 Last dentist exam: Remote - nearly full partials  Patient Care Team: [name], MD as PCP - General (Internal Medicine) Stanford Breed Denice Bors, MD as PCP - Cardiology (Cardiology) Deboraha Sprang, MD as PCP - Electrophysiology (Cardiology) Ilean China as Physician Assistant (Cardiology) Irene Shipper, MD as Consulting Physician (Gastroenterology) Leighton Ruff, MD as Consulting Physician (General Surgery)  Surgical: He  has a past surgical history that includes Knee surgery; Bladder surgery (1969); Coronary angioplasty with stent; Cardiac catheterization (N/A, 07/01/2015); Coronary artery bypass graft (N/A, 07/06/2015); TEE without cardioversion (N/A, 07/06/2015); Colon resection (N/A, 05/17/2017); LOOP  RECORDER INSERTION (N/A, 02/19/2018); Esophagogastroduodenoscopy (egd) with propofol (N/A, 06/25/2018); biopsy (06/25/2018); Colonoscopy with propofol (N/A, 04/24/2019); Esophagogastroduodenoscopy (egd) with propofol (N/A, 04/24/2019); Esophageal dilation (04/24/2019); biopsy (04/24/2019); polypectomy (04/24/2019); Submucosal lifting injection (04/24/2019); Hemostasis clip placement (04/24/2019); and Femur IM nail (Right, 11/16/2019). Family His family history includes Anuerysm in his brother; Arthritis in his mother; Colon cancer in his sister; Dementia in his mother; Diabetes in his maternal grandmother; Healthy in his daughter and son; Heart Problems in his brother, brother, brother, and brother; Heart attack in his brother, father, and sister; Liver cancer in his sister. Social history  He reports that he quit smoking about 56 years ago. He quit smokeless tobacco use about 33 years ago.  His smokeless tobacco use included chew. He reports that he does not drink alcohol and does not use drugs.  MEDICARE WELLNESS OBJECTIVES: Physical activity: Current Exercise Habits: The patient does not participate in regular exercise at present, Exercise limited by: orthopedic condition(s) (Starting PT) Cardiac risk factors: Cardiac Risk Factors include: advanced age (>73men, >63 women);diabetes mellitus;dyslipidemia;hypertension;male gender;sedentary lifestyle;smoking/ tobacco exposure Depression/mood screen:   Depression screen [facility] 2/9 06/09/2020  Decreased Interest 0  Down, Depressed, Hopeless 0  PHQ - 2 Score 0  Altered sleeping -  Tired, decreased energy -  Change in appetite -  Feeling bad or failure about yourself  -  Trouble concentrating -  Moving slowly or fidgety/restless -  Suicidal thoughts -  PHQ-9 Score -  Some recent data might be hidden    ADLs:  In your present state of health, do you have any difficulty performing the following activities: 06/14/2020 03/30/2020  Hearing? Y N  Comment Wife  assists -  Vision? N N  Difficulty concentrating or making decisions? N N  Comment - unstable gait, wheelchair  dependent  Walking or climbing stairs? Y Y  Dressing or bathing? N Y  Comment - wife assists  Doing errands, shopping? Y Y  Comment - wife transports  Conservation officer, nature and eating ? Y -  Using the Toilet? N -  In the past six months, have you accidently leaked urine? Y -  Do you have problems with loss of bowel control? N -  Managing your Medications? Y -  Comment Wife manages this -  Managing  your Finances? Y -  Comment Wife manages -  Housekeeping or managing your Housekeeping? Y -  Comment Wife manages -  Some recent data might be hidden     Cognitive Testing  Alert? Yes  Normal Appearance?Yes  Oriented to person? Yes  Place? Yes   Time? Yes  Recall of three objects?  Yes  Can perform simple calculations? Yes  Displays appropriate judgment?Yes  Can read the correct time from a watch face?Yes  EOL planning: Does Patient Have a Medical Advance Directive?: Yes Type of Advance Directive: [place] will Long in Chart?: No - copy requested   Objective:   Today's Vitals   06/09/20 1121  BP: (!) 150/80  Pulse: 83  Temp: 97.9 F (36.6 C)  SpO2: 98%   There is no height or weight on file to calculate BMI.  General appearance: alert, no distress, WD/WN, male HEENT: normocephalic, sclerae anicteric, TMs pearly, nares patent, no discharge or erythema, pharynx normal Oral cavity: MMM, no lesions Neck: supple, no lymphadenopathy, no thyromegaly, no masses Heart: RRR, normal S1, S2, no murmurs Lungs: CTA bilaterally, no wheezes, rhonchi, or rales Abdomen: +bs, soft, non tender, non distended, no masses, no hepatomegaly, no splenomegaly Musculoskeletal: nontender, no swelling, no obvious deformity Extremities: no edema, no cyanosis, no clubbing Pulses: 1+ symmetric, upper and lower extremities, normal cap  refill Neurological: alert, oriented x 3, CN2-12 intact, strength normal upper extremities, R quads with 4/5, otherwise symmetrical, sensation significantly decreased to monofilament testing bilaterally senses 2-3/10 points, sitting to standing slow, needs assistance, in wheelchair today Psychiatric: normal affect, behavior normal, pleasant    Medicare Attestation I have personally reviewed: The patient's medical and social history Their use of alcohol, tobacco or illicit drugs Their current medications and supplements The patient's functional ability including ADLs,fall risks, home safety risks, cognitive, and hearing and visual impairment Diet and physical activities Evidence for depression or mood disorders  The patient's weight, height, BMI, and visual acuity have been recorded in the chart.  I have made referrals, counseling, and provided education to the patient based on review of the above and I have provided the patient with a written personalized care plan for preventive services.     Garnet Sierras, NP   06/09/2020

## 2020-06-09 NOTE — Telephone Encounter (Signed)
I have resent Referral to to ortho PT for patient thanks Hinton Dyer

## 2020-06-10 LAB — CBC WITH DIFFERENTIAL/PLATELET
Absolute Monocytes: 393 cells/uL (ref 200–950)
Basophils Absolute: 62 cells/uL (ref 0–200)
Basophils Relative: 0.9 %
Eosinophils Absolute: 380 cells/uL (ref 15–500)
Eosinophils Relative: 5.5 %
HCT: 34.5 % — ABNORMAL LOW (ref 38.5–50.0)
Hemoglobin: 11.1 g/dL — ABNORMAL LOW (ref 13.2–17.1)
Lymphs Abs: 1925 cells/uL (ref 850–3900)
MCH: 33.3 pg — ABNORMAL HIGH (ref 27.0–33.0)
MCHC: 32.2 g/dL (ref 32.0–36.0)
MCV: 103.6 fL — ABNORMAL HIGH (ref 80.0–100.0)
MPV: 10.1 fL (ref 7.5–12.5)
Monocytes Relative: 5.7 %
Neutro Abs: 4140 cells/uL (ref 1500–7800)
Neutrophils Relative %: 60 %
Platelets: 175 10*3/uL (ref 140–400)
RBC: 3.33 10*6/uL — ABNORMAL LOW (ref 4.20–5.80)
RDW: 12.7 % (ref 11.0–15.0)
Total Lymphocyte: 27.9 %
WBC: 6.9 10*3/uL (ref 3.8–10.8)

## 2020-06-10 LAB — COMPLETE METABOLIC PANEL WITH GFR
AG Ratio: 1.6 (calc) (ref 1.0–2.5)
ALT: 7 U/L — ABNORMAL LOW (ref 9–46)
AST: 14 U/L (ref 10–35)
Albumin: 3.6 g/dL (ref 3.6–5.1)
Alkaline phosphatase (APISO): 100 U/L (ref 35–144)
BUN/Creatinine Ratio: 16 (calc) (ref 6–22)
BUN: 28 mg/dL — ABNORMAL HIGH (ref 7–25)
CO2: 33 mmol/L — ABNORMAL HIGH (ref 20–32)
Calcium: 9 mg/dL (ref 8.6–10.3)
Chloride: 109 mmol/L (ref 98–110)
Creat: 1.73 mg/dL — ABNORMAL HIGH (ref 0.70–1.11)
GFR, Est African American: 41 mL/min/{1.73_m2} — ABNORMAL LOW (ref >=60)
GFR, Est Non African American: 35 mL/min/{1.73_m2} — ABNORMAL LOW (ref >=60)
Globulin: 2.2 g/dL (calc) (ref 1.9–3.7)
Glucose, Bld: 115 mg/dL — ABNORMAL HIGH (ref 65–99)
Potassium: 4.3 mmol/L (ref 3.5–5.3)
Sodium: 148 mmol/L — ABNORMAL HIGH (ref 135–146)
Total Bilirubin: 0.4 mg/dL (ref 0.2–1.2)
Total Protein: 5.8 g/dL — ABNORMAL LOW (ref 6.1–8.1)

## 2020-06-10 LAB — TSH: TSH: 3.85 mIU/L (ref 0.40–4.50)

## 2020-06-12 ENCOUNTER — Other Ambulatory Visit: Payer: Self-pay

## 2020-06-12 ENCOUNTER — Ambulatory Visit: Payer: Medicare Other | Admitting: Physical Therapy

## 2020-06-12 DIAGNOSIS — R29818 Other symptoms and signs involving the nervous system: Secondary | ICD-10-CM

## 2020-06-12 DIAGNOSIS — M5441 Lumbago with sciatica, right side: Secondary | ICD-10-CM

## 2020-06-12 DIAGNOSIS — M6281 Muscle weakness (generalized): Secondary | ICD-10-CM | POA: Diagnosis not present

## 2020-06-12 DIAGNOSIS — R2689 Other abnormalities of gait and mobility: Secondary | ICD-10-CM

## 2020-06-12 DIAGNOSIS — G8929 Other chronic pain: Secondary | ICD-10-CM

## 2020-06-12 NOTE — Therapy (Signed)
[facility] [address] [place], [place], [address] Phone: [phone]   Fax:  [phone]  Physical Therapy Evaluation  Patient Details  Name: [name] MRN: [id number] Date of Birth: 05-01-1934 Referring Provider (PT): Royal Hawthorn, MD   Encounter Date: 06/12/2020   PT End of Session - 06/12/20 1354    Visit Number 1    Number of Visits 16    Date for PT Re-Evaluation 09/04/20    Authorization Type U[employer] MCR    PT Start Time 1300    PT Stop Time 1345    PT Time Calculation (min) 45 min    Equipment Utilized During Treatment Gait belt    Activity Tolerance Patient tolerated treatment well    Behavior During Therapy [facility] for tasks assessed/performed           Past Medical History:  Diagnosis Date  . AAA (abdominal aortic aneurysm) ([place])   . 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 CAD (coronary artery disease)    a. s/p CABGx4 in 07/2015.  . Carotid artery disease ([place])   . Colon polyps   . Diabetes ([place])   . Difficult intubation   . 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 Esophageal reflux   . High cholesterol   . History of IBS 02/27/2009  . Hypertension    pt denies, he says he has a h/o hypotension. If BP up he adjusts the [place]  . Jejunitis with partial SBO 05/20/2017  . Orthostatic hypotension   . Prostatitis   . Stroke ([place])   . TIA (transient ischemic attack)   . Upper GI bleed 06/24/2018  . Vitamin B 12 deficiency   . Vitamin D deficiency     Past Surgical History:  Procedure Laterality Date  . BIOPSY  06/25/2018   Procedure: BIOPSY;  Surgeon: Rush Landmark Telford Nab., MD;  Location: [place];  Service: Gastroenterology;;  . BIOPSY  04/24/2019   Procedure: BIOPSY;  Surgeon: Irving Copas., MD;  Location: [facility];  Service: Gastroenterology;;  . BLADDER SURGERY  1969   traumatic pelvic fractures, urethral and bladder repair  . CARDIAC CATHETERIZATION N/A 07/01/2015   Procedure: Left Heart Cath and  Coronary Angiography;  Surgeon: Wellington Hampshire, MD;  Location: [facility];  Service: Cardiovascular;  Laterality: N/A;  . COLON RESECTION N/A 05/17/2017   Procedure: DIAGNOSTIC LAPAROSCOPY,;  Surgeon: Leighton Ruff, MD;  Location: [facility];  Service: General;  Laterality: N/A;  . COLONOSCOPY WITH PROPOFOL N/A 04/24/2019   Procedure: COLONOSCOPY WITH PROPOFOL;  Surgeon: Irving Copas., MD;  Location: [facility];  Service: Gastroenterology;  Laterality: N/A;  . CORONARY ANGIOPLASTY WITH STENT PLACEMENT    . CORONARY ARTERY BYPASS GRAFT N/A 07/06/2015   Procedure: CORONARY ARTERY BYPASS GRAFTING (CABG)x 4   utilizing the left internal mammary artery and endoscopically harvested bilateral  sapheneous vein.;  Surgeon: Ivin Poot, MD;  Location: [place];  Service: Open Heart Surgery;  Laterality: N/A;  . ESOPHAGEAL DILATION  04/24/2019   Procedure: ESOPHAGEAL DILATION;  Surgeon: Rush Landmark Telford Nab., MD;  Location: [facility];  Service: Gastroenterology;;  . ESOPHAGOGASTRODUODENOSCOPY (EGD) WITH PROPOFOL N/A 06/25/2018   Procedure: ESOPHAGOGASTRODUODENOSCOPY (EGD) WITH PROPOFOL;  Surgeon: Irving Copas., MD;  Location: [facility];  Service: Gastroenterology;  Laterality: N/A;  . ESOPHAGOGASTRODUODENOSCOPY (EGD) WITH PROPOFOL N/A 04/24/2019   Procedure: ESOPHAGOGASTRODUODENOSCOPY (EGD) WITH PROPOFOL;  Surgeon: Rush Landmark Telford Nab., MD;  Location: [facility];  Service: Gastroenterology;  Laterality: N/A;  . FEMUR  IM NAIL Right 11/16/2019   Procedure: RIGHT HIP INTRAMEDULLARY (IM) NAIL FEMORAL;  Surgeon: Meredith Pel, MD;  Location: [facility];  Service: Orthopedics;  Laterality: Right;  . HEMOSTASIS CLIP PLACEMENT  04/24/2019   Procedure: HEMOSTASIS CLIP PLACEMENT;  Surgeon: Irving Copas., MD;  [facility];  Service: Gastroenterology;;  . KNEE SURGERY    . LOOP RECORDER INSERTION N/A 02/19/2018   Procedure: LOOP RECORDER INSERTION;  Surgeon:  Deboraha Sprang, MD;  Location: [place] [facility];  Service: Cardiovascular;  Laterality: N/A;  . POLYPECTOMY  04/24/2019   Procedure: POLYPECTOMY;  Surgeon: Rush Landmark Telford Nab., MD;  Location: [facility];  Service: Gastroenterology;;  . Lia Foyer LIFTING INJECTION  04/24/2019   Procedure: SUBMUCOSAL LIFTING INJECTION;  Surgeon: Irving Copas., MD;  Location[facility];  Service: Gastroenterology;;  . TEE WITHOUT CARDIOVERSION N/A 07/06/2015   Procedure: TRANSESOPHAGEAL ECHOCARDIOGRAM (TEE);  Surgeon: Ivin Poot, MD;  Location: [place];  Service: Open Heart Surgery;  Laterality: N/A;    There were no vitals filed for this visit.    Subjective Assessment - 06/12/20 1313    Subjective He was in PT last year and this was discontinued after he fell sustaining pelvic fracture. He was then seen by neurology due to AMS and more falls. He was then referred back to PT. He states he is having difficuly with memory thus most of the history is attained from his wife. She states he has has had 2 falls in the last month and that he is having a lot of difficulty with standing and walking and he now spends most of the time using wheelchair for locomotion.    Pertinent History PMH: repeated falls, pelvic fx, CVA X5, DM, CKD, CAD, CABGX4,mild dementia    Limitations Lifting;Standing;Walking;House hold activities    How long can you stand comfortably? one minute    Diagnostic tests see chart for lumbar/pelvic MRI and XR    Patient Stated Goals get legs stronger and walk again    Currently in Pain? Yes    Pain Score 4     Pain Location Back    Pain Orientation Lower    Pain Descriptors / Indicators Aching    Pain Type Chronic pain    Pain Radiating Towards down bilat knees    Pain Onset More than a month ago    Pain Frequency Constant    Aggravating Factors  walking or standing    Pain Relieving Factors rest              [facility] PT Assessment - 06/12/20 0001      Assessment    Medical Diagnosis gait abnormaility, Spinal stenosis of lumbar region with neurogenic claudication, bilat leg weakness Rt worse than Lt.    Referring Provider (PT) Royal Hawthorn, MD    Onset Date/Surgical Date --   Chronic LBP   Next MD Visit nothing set up with referring provider    Prior Therapy PT last year for same thing      Precautions   Precautions Fall      Restrictions   Weight Bearing Restrictions No      Balance Screen   Has the patient fallen in the past 6 months Yes    How many times? 3    Has the patient had a decrease in activity level because of a fear of falling?  Yes    Is the patient reluctant to leave their home because of a fear of falling?  Yes      [place] residence    Additional Comments has ramp to enter      Prior Function   Level of Independence Requires assistive device for independence    Vocation Retired    Microbiologist   Overall Cognitive Status History of cognitive impairments - at baseline      ROM / Strength   AROM / PROM / Strength Strength      Strength   Strength Assessment Site Hip;Knee;Ankle    Right/Left Hip Right;Left    Right Hip Flexion 4-/5    Right Hip ABduction 3+/5    Right Hip ADduction 4+/5    Left Hip Flexion 4/5    Left Hip ABduction 4/5    Left Hip ADduction 4+/5    Right/Left Knee Right;Left    Right Knee Flexion 4/5    Right Knee Extension 4-/5    Left Knee Flexion 4/5    Left Knee Extension 4/5    Right/Left Ankle Right;Left    Right Ankle Dorsiflexion 3+/5    Left Ankle Dorsiflexion 3+/5      Ambulation/Gait   Ambulation/Gait Yes    Ambulation Distance (Feet) 50 Feet    Assistive device Rolling walker    Gait Pattern Step-to pattern;Decreased stance time - right;Decreased stride length;Decreased hip/knee flexion - right;Decreased hip/knee flexion - left;Decreased dorsiflexion - right;Decreased dorsiflexion - left;Decreased weight shift to right     Gait velocity very slow      Standardized Balance Assessment   Standardized Balance Assessment Timed Up and Go Test;Five Times Sit to Stand    Five times sit to stand comments  37 seconds using UE to push up and one UE to grab RW for balance PRN      Timed Up and Go Test   TUG Normal TUG    Normal TUG (seconds) 46    TUG Comments with RW                      Objective measurements completed on examination: See above findings.       [facility] PT Treatment/Exercise - 06/12/20 0001      Exercises   Exercises Knee/Hip      Knee/Hip Exercises: Aerobic   Nustep L5-3 for 10 min total with rest breaks PRN                  PT Education - 06/12/20 1354    Education Details HEP    Person(s) Educated Patient;Spouse    Methods Explanation;Demonstration;Verbal cues;Handout    Comprehension Verbalized understanding;Need further instruction            PT Short Term Goals - 06/12/20 1402      PT SHORT TERM GOAL #1   Title independent with initial HEP (Target for all STGs 07/10/20)    Time 4    Period Weeks    Status New      PT SHORT TERM GOAL #2   Title Patient will ambulate with LRAD CGA at least 100 ft    Baseline 50 ft with RW    Status New      PT SHORT TERM GOAL #3   Title -    Baseline -      PT SHORT TERM GOAL #4   Title -             PT Long Term Goals -  06/12/20 1407      PT LONG TERM GOAL #1   Title independent with updated HEP (Target all LTG 12 weeks  09/04/20)    Time 12    Period Weeks    Status New      PT LONG TERM GOAL #2   Title improve 5TSTS test to less than 25 seconds to show improved leg strength/endurance    Baseline 37    Time 12    Period Weeks    Status New      PT LONG TERM GOAL #3   Title Improve bilat leg strength to at least 4+/5 MMT measured in sitting to improve function    Baseline 4- to 4    Time 12    Period Weeks    Status New      PT LONG TERM GOAL #4   Title Will improve TUG to less than 30  sec to show improved balance and gait speed.    Baseline 46.9 with RW around cone    Time 12    Period Weeks    Status New      PT LONG TERM GOAL #5   Title Ambulate with LRAD mod I  for 300 ft or stand at least 10 min    Baseline 50 ft with RW    Time 12    Period Weeks    Status New                  Plan - 06/12/20 1340    Clinical Impression Statement Patient returns to PT with new MD referral from neurologist for gait abnormaility. He has had recent falls, has poor balance, has bilat leg weakness Rt worse than Lt, and has chronic LBP with severe stenosis and DDD. He will benefit from skilled PT to address these functional deficits. Prognosis is fair due to complex medical history with multiple co morbidiites and cognitive deficits.    Personal Factors and Comorbidities Comorbidity 3+;Time since onset of injury/illness/exacerbation;Past/Current Experience;Fitness    Comorbidities PMH: repeated falls, pelvic fx, CVA X5, DM, CKD, CAD, CABGX4,mild dementia    Examination-Activity Limitations Bathing;Bed Mobility;Bend;Carry;Lift;Stand;Toileting;Stairs;Squat;Locomotion Level    Examination-Participation Restrictions Art gallery manager;Shop    Stability/Clinical Decision Making Evolving/Moderate complexity    Clinical Decision Making Moderate    Rehab Potential Fair    PT Frequency 2x / week    PT Duration 8 weeks    PT Treatment/Interventions Aquatic Therapy;Cryotherapy;Dentist;Therapeutic activities;Therapeutic exercise;Neuromuscular re-education;Manual techniques;Patient/family education;Passive range of motion;Taping    PT Next Visit Plan leg strength, endurance, balance, gait with RW/rollator    PT Home Exercise Plan [facility]    Consulted and Agree with Plan of Care Patient           Patient will benefit from skilled therapeutic intervention in order to improve the following deficits and impairments:   Abnormal gait,Decreased activity tolerance,Decreased balance,Decreased endurance,Decreased coordination,Decreased range of motion,Decreased strength,Difficulty walking,Postural dysfunction,Pain,Improper body mechanics  Visit Diagnosis: Other abnormalities of gait and mobility  Muscle weakness (generalized)  Chronic bilateral low back pain with right-sided sciatica  Other symptoms and signs involving the nervous system     Problem List Patient Active Problem List   Diagnosis Date Noted  . Situational depression 01/22/2020  . Urinary frequency 01/22/2020  . Bacteremia due to Staphylococcus epidermidis 11/24/2019  . Stroke (cerebrum) ([place])   . Elevated troponin   . Closed right hip fracture ([place]) 11/15/2019  . Unilateral primary osteoarthritis, right knee 11/13/2019  .  At high risk for falls 05/29/2019  . Chronic bilateral low back pain without sciatica 02/07/2019  . Statin intolerance 11/16/2018  . Hyperlipidemia, mixed 08/15/2018  . Low back pain 07/18/2018  . History of lacunar cerebrovascular accident (CVA) 02/12/2018  . Dizziness 02/12/2018  . Myofascial pain 12/18/2017  . Spondylosis without myelopathy or radiculopathy, lumbar region 12/18/2017  . Lumbar radiculopathy, right 12/18/2017  . Cholelithiasis 05/17/2017  . Sinus Bradycardia 05/17/2017  . Thoracic radiculopathy 04/12/2017  . Primary osteoarthritis of right knee 12/14/2016  . DDD (degenerative disc disease), lumbar 10/10/2016  . Lumbar facet arthropathy 08/29/2016  . Idiopathic scoliosis 03/22/2016  . Diabetic neuropathy ([place]) 03/22/2016  . Dysautonomia orthostatic hypotension syndrome 02/25/2016  . CKD stage 3 due to type 2 diabetes mellitus ([place]) 02/19/2016  . Coronary artery disease involving coronary bypass graft of native heart without angina pectoris   . Diabetes mellitus type 2, diet-controlled ([place])   . 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 Medication management 08/11/2015  . S/P CABG x 4 07/06/2015   . PVD (peripheral vascular disease) ([place]) 01/01/2014  . Hyperlipidemia associated with type 2 diabetes mellitus ([place]) 04/11/2013  . Supine hypertension   . Vitamin D deficiency   . Vitamin B 12 deficiency   . Coronary atherosclerosis- s/p PCI to LAD in 2009 and PCI to RCA in 2011 02/27/2009  . Abdominal aortic aneurysm ([place]) 02/27/2009  . GERD 02/27/2009    Silvestre Mesi 06/12/2020, 2:21 PM  [facility] [address] [place], [place], [address] Phone: [phone]   Fax:  [phone]  Name: [name] MRN: [id number] Date of Birth: [birth year]

## 2020-06-17 ENCOUNTER — Encounter: Payer: Self-pay | Admitting: Internal Medicine

## 2020-06-17 ENCOUNTER — Other Ambulatory Visit: Payer: Self-pay | Admitting: Neurology

## 2020-06-17 NOTE — Telephone Encounter (Signed)
Called patient's wife, patient is tolerating the namenda without any difficulties.  Refusing the titration pack refill.

## 2020-06-19 ENCOUNTER — Other Ambulatory Visit: Payer: Self-pay | Admitting: Neurology

## 2020-06-22 ENCOUNTER — Ambulatory Visit (INDEPENDENT_AMBULATORY_CARE_PROVIDER_SITE_OTHER): Payer: Medicare Other

## 2020-06-22 DIAGNOSIS — I6310 Cerebral infarction due to embolism of unspecified precerebral artery: Secondary | ICD-10-CM

## 2020-06-23 ENCOUNTER — Ambulatory Visit: Payer: Medicare Other | Admitting: Physical Therapy

## 2020-06-23 ENCOUNTER — Encounter: Payer: Self-pay | Admitting: Physical Therapy

## 2020-06-23 ENCOUNTER — Other Ambulatory Visit: Payer: Self-pay

## 2020-06-23 ENCOUNTER — Other Ambulatory Visit: Payer: Self-pay | Admitting: Internal Medicine

## 2020-06-23 DIAGNOSIS — M545 Low back pain, unspecified: Secondary | ICD-10-CM

## 2020-06-23 DIAGNOSIS — R2689 Other abnormalities of gait and mobility: Secondary | ICD-10-CM

## 2020-06-23 DIAGNOSIS — M6281 Muscle weakness (generalized): Secondary | ICD-10-CM

## 2020-06-23 DIAGNOSIS — G8929 Other chronic pain: Secondary | ICD-10-CM

## 2020-06-23 DIAGNOSIS — M5441 Lumbago with sciatica, right side: Secondary | ICD-10-CM | POA: Diagnosis not present

## 2020-06-23 NOTE — Therapy (Signed)
[facility] [address] [place], [place], [address] Phone: [phone]   Fax:  [phone]  Physical Therapy Treatment  Patient Details  Name: [name] MRN: [id number] Date of Birth: 09-19-1933 Referring Provider (PT): Royal Hawthorn, MD   Encounter Date: 06/23/2020   PT End of Session - 06/23/20 1300    Visit Number 2    Number of Visits 16    Date for PT Re-Evaluation 09/04/20    Authorization Type UHC MCR    PT Start Time 1300    PT Stop Time 1345    PT Time Calculation (min) 45 min    Equipment Utilized During Treatment Gait belt    Activity Tolerance Patient tolerated treatment well    Behavior During Therapy [facility] for tasks assessed/performed           Past Medical History:  Diagnosis Date  . AAA (abdominal aortic aneurysm) ([place])   . 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 CAD (coronary artery disease)    a. s/p CABGx4 in 07/2015.  . Carotid artery disease ([place])   . Colon polyps   . Diabetes ([place])   . Difficult intubation   . 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 Esophageal reflux   . High cholesterol   . History of IBS 02/27/2009  . Hypertension    pt denies, he says he has a h/o hypotension. If BP up he adjusts the Florinef  . Jejunitis with partial SBO 05/20/2017  . Orthostatic hypotension   . Prostatitis   . Stroke ([place])   . TIA (transient ischemic attack)   . Upper GI bleed 06/24/2018  . Vitamin B 12 deficiency   . Vitamin D deficiency     Past Surgical History:  Procedure Laterality Date  . BIOPSY  06/25/2018   Procedure: BIOPSY;  Surgeon: Rush Landmark Telford Nab., MD;  Location: [place];  Service: Gastroenterology;;  . BIOPSY  04/24/2019   Procedure: BIOPSY;  Surgeon: Irving Copas., MD;  Location[facility];  Service: Gastroenterology;;  . BLADDER SURGERY  1969   traumatic pelvic fractures, urethral and bladder repair  . CARDIAC CATHETERIZATION N/A 07/01/2015   Procedure: Left Heart Cath and  Coronary Angiography;  Surgeon: Wellington Hampshire, MD;  Location: [place] [facility];  Service: Cardiovascular;  Laterality: N/A;  . COLON RESECTION N/A 05/17/2017   Procedure: DIAGNOSTIC LAPAROSCOPY,;  Surgeon: Leighton Ruff, MD;  [facility];  Service: General;  Laterality: N/A;  . COLONOSCOPY WITH PROPOFOL N/A 04/24/2019   Procedure: COLONOSCOPY WITH PROPOFOL;  Surgeon: Irving Copas., MD;  [facility];  Service: Gastroenterology;  Laterality: N/A;  . CORONARY ANGIOPLASTY WITH STENT PLACEMENT    . CORONARY ARTERY BYPASS GRAFT N/A 07/06/2015   Procedure: CORONARY ARTERY BYPASS GRAFTING (CABG)x 4   utilizing the left internal mammary artery and endoscopically harvested bilateral  sapheneous vein.;  Surgeon: Ivin Poot, MD;  Location: [place];  Service: Open Heart Surgery;  Laterality: N/A;  . ESOPHAGEAL DILATION  04/24/2019   Procedure: ESOPHAGEAL DILATION;  Surgeon: Rush Landmark Telford Nab., MD;  [facility];  Service: Gastroenterology;;  . ESOPHAGOGASTRODUODENOSCOPY (EGD) WITH PROPOFOL N/A 06/25/2018   Procedure: ESOPHAGOGASTRODUODENOSCOPY (EGD) WITH PROPOFOL;  Surgeon: Irving Copas., MD;  Location: [place];  Service: Gastroenterology;  Laterality: N/A;  . ESOPHAGOGASTRODUODENOSCOPY (EGD) WITH PROPOFOL N/A 04/24/2019   Procedure: ESOPHAGOGASTRODUODENOSCOPY (EGD) WITH PROPOFOL;  Surgeon: Rush Landmark Telford Nab., MD;  [facility];  Service: Gastroenterology;  Laterality: N/A;  . FEMUR  IM NAIL Right 11/16/2019   Procedure: RIGHT HIP INTRAMEDULLARY (IM) NAIL FEMORAL;  Surgeon: Meredith Pel, MD;  Location: [place];  Service: Orthopedics;  Laterality: Right;  . HEMOSTASIS CLIP PLACEMENT  04/24/2019   Procedure: HEMOSTASIS CLIP PLACEMENT;  Surgeon: Irving Copas., MD;  [facility];  Service: Gastroenterology;;  . KNEE SURGERY    . LOOP RECORDER INSERTION N/A 02/19/2018   Procedure: LOOP RECORDER INSERTION;  Surgeon:  Deboraha Sprang, MD;  Location: [place] [facility];  Service: Cardiovascular;  Laterality: N/A;  . POLYPECTOMY  04/24/2019   Procedure: POLYPECTOMY;  Surgeon: Rush Landmark Telford Nab., MD;  Location: [facility];  Service: Gastroenterology;;  . Lia Foyer LIFTING INJECTION  04/24/2019   Procedure: SUBMUCOSAL LIFTING INJECTION;  Surgeon: Irving Copas., MD;  [facility];  Service: Gastroenterology;;  . TEE WITHOUT CARDIOVERSION N/A 07/06/2015   Procedure: TRANSESOPHAGEAL ECHOCARDIOGRAM (TEE);  Surgeon: Ivin Poot, MD;  Location: [place];  Service: Open Heart Surgery;  Laterality: N/A;    There were no vitals filed for this visit.   Subjective Assessment - 06/23/20 1300    Subjective He has been doing his exercises issued at PT evaluation    Pertinent History PMH: repeated falls, pelvic fx, CVA X5, DM, CKD, CAD, CABGX4,mild dementia    Limitations Lifting;Standing;Walking;House hold activities    How long can you stand comfortably? one minute    Diagnostic tests see chart for lumbar/pelvic MRI and XR    Patient Stated Goals get legs stronger and walk again    Currently in Pain? Yes    Pain Score 5     Pain Location Thoracic    Pain Orientation Posterior    Pain Descriptors / Indicators Sharp    Pain Type Chronic pain    Pain Onset More than a month ago    Pain Frequency Constant    Aggravating Factors  moving arms    Pain Relieving Factors meds                             [facility] Adult PT Treatment/Exercise - 06/23/20 1300      Ambulation/Gait   Ambulation/Gait Yes    Ambulation/Gait Assistance 4: Min guard    Ambulation/Gait Assistance Details visual cues on RW to facilitate step through pattern & proper step width, verbal cues & tactile cues on upright posture & weight shift to stance limb    Ambulation Distance (Feet) 40 Feet   40' X 2   Assistive device Rolling walker    Gait Pattern --    Ambulation Surface Level;Indoor    Pre-Gait  Activities in //bars with mirror for visual & facilitate upright posture, stepping back & forward over lines on floor for step through pattern. 10 reps ea. LE      Therapeutic Activites    Therapeutic Activities Other Therapeutic Activities    Other Therapeutic Activities seated on 24" bar stool without UE support: forward lean to upright, backward lean to upright 5 reps ea.  Sit to / from stand without UE support 5 reps, initial 3 reps touched //bars to stabilize.      Exercises   Exercises Knee/Hip      Knee/Hip Exercises: Aerobic   Nustep Level 5 with BLEs & BUEs for 5 minutes seat 12      Knee/Hip Exercises: Standing   Hip Abduction AROM;Stengthening;1 set;10 reps;Knee straight    Abduction Limitations //bar support BUEs stepping over  line laterally with weight shift onto LE 10 reps ea LE.                    PT Short Term Goals - 06/12/20 1402      PT SHORT TERM GOAL #1   Title independent with initial HEP (Target for all STGs 07/10/20)    Time 4    Period Weeks    Status New      PT SHORT TERM GOAL #2   Title Patient will ambulate with LRAD CGA at least 100 ft    Baseline 50 ft with RW    Status New      PT SHORT TERM GOAL #3   Title -    Baseline -      PT SHORT TERM GOAL #4   Title -             PT Long Term Goals - 06/12/20 1407      PT LONG TERM GOAL #1   Title independent with updated HEP (Target all LTG 12 weeks  09/04/20)    Time 12    Period Weeks    Status New      PT LONG TERM GOAL #2   Title improve 5TSTS test to less than 25 seconds to show improved leg strength/endurance    Baseline 37    Time 12    Period Weeks    Status New      PT LONG TERM GOAL #3   Title Improve bilat leg strength to at least 4+/5 MMT measured in sitting to improve function    Baseline 4- to 4    Time 12    Period Weeks    Status New      PT LONG TERM GOAL #4   Title Will improve TUG to less than 30 sec to show improved balance and gait speed.     Baseline 46.9 with RW around cone    Time 12    Period Weeks    Status New      PT LONG TERM GOAL #5   Title Ambulate with LRAD mod I  for 300 ft or stand at least 10 min    Baseline 50 ft with RW    Time 12    Period Weeks    Status New                 Plan - 06/23/20 1300    Clinical Impression Statement Patient fatigued quickly with each activity requiring frequent rests. PT session focused on exercises and activities to facilitate upright trunk and LE strength.    Personal Factors and Comorbidities Comorbidity 3+;Time since onset of injury/illness/exacerbation;Past/Current Experience;Fitness    Comorbidities PMH: repeated falls, pelvic fx, CVA X5, DM, CKD, CAD, CABGX4,mild dementia    Examination-Activity Limitations Bathing;Bed Mobility;Bend;Carry;Lift;Stand;Toileting;Stairs;Squat;Locomotion Level    Examination-Participation Restrictions Art gallery manager;Shop    Stability/Clinical Decision Making Evolving/Moderate complexity    Rehab Potential Fair    PT Frequency 2x / week    PT Duration 8 weeks    PT Treatment/Interventions Aquatic Therapy;Cryotherapy;Dentist;Therapeutic activities;Therapeutic exercise;Neuromuscular re-education;Manual techniques;Patient/family education;Passive range of motion;Taping    PT Next Visit Plan leg strength, endurance, balance, gait with RW/rollator    PT Home Exercise Plan [facility]    Consulted and Agree with Plan of Care Patient           Patient will benefit from skilled therapeutic intervention in order to improve the following deficits  and impairments:  Abnormal gait,Decreased activity tolerance,Decreased balance,Decreased endurance,Decreased coordination,Decreased range of motion,Decreased strength,Difficulty walking,Postural dysfunction,Pain,Improper body mechanics  Visit Diagnosis: Other abnormalities of gait and mobility  Muscle weakness (generalized)  Chronic  bilateral low back pain with right-sided sciatica  Chronic bilateral low back pain, unspecified whether sciatica present     Problem List Patient Active Problem List   Diagnosis Date Noted  . Situational depression 01/22/2020  . Urinary frequency 01/22/2020  . Bacteremia due to Staphylococcus epidermidis 11/24/2019  . Stroke (cerebrum) ([place])   . Elevated troponin   . Closed right hip fracture ([place]) 11/15/2019  . Unilateral primary osteoarthritis, right knee 11/13/2019  . At high risk for falls 05/29/2019  . Chronic bilateral low back pain without sciatica 02/07/2019  . Statin intolerance 11/16/2018  . Hyperlipidemia, mixed 08/15/2018  . Low back pain 07/18/2018  . History of lacunar cerebrovascular accident (CVA) 02/12/2018  . Dizziness 02/12/2018  . Myofascial pain 12/18/2017  . Spondylosis without myelopathy or radiculopathy, lumbar region 12/18/2017  . Lumbar radiculopathy, right 12/18/2017  . Cholelithiasis 05/17/2017  . Sinus Bradycardia 05/17/2017  . Thoracic radiculopathy 04/12/2017  . Primary osteoarthritis of right knee 12/14/2016  . DDD (degenerative disc disease), lumbar 10/10/2016  . Lumbar facet arthropathy 08/29/2016  . Idiopathic scoliosis 03/22/2016  . Diabetic neuropathy ([place]) 03/22/2016  . Dysautonomia orthostatic hypotension syndrome 02/25/2016  . CKD stage 3 due to type 2 diabetes mellitus ([place]) 02/19/2016  . Coronary artery disease involving coronary bypass graft of native heart without angina pectoris   . Diabetes mellitus type 2, diet-controlled ([place])   . 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 Medication management 08/11/2015  . 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 PVD (peripheral vascular disease) ([place]) 01/01/2014  . Hyperlipidemia associated with type 2 diabetes mellitus ([place]) 04/11/2013  . Supine hypertension   . Vitamin D deficiency   . Vitamin B 12 deficiency   . Coronary atherosclerosis- s/p PCI to LAD in 2009 and PCI to RCA in 2011 02/27/2009  .  Abdominal aortic aneurysm ([place]) 02/27/2009  . GERD 02/27/2009    Jamey Reas, PT, DPT 06/23/2020, 2:02 PM  [facility] Physical Therapy [address] [place], [place], [address] Phone: [phone]   Fax:  [phone]  Name: HARACE MCCLUNEY MRN: [id number] Date of Birth: 26-Jul-1933

## 2020-06-24 LAB — CUP PACEART REMOTE DEVICE CHECK
Date Time Interrogation Session: 20220221025710
Implantable Pulse Generator Implant Date: 20191021

## 2020-06-25 ENCOUNTER — Encounter: Payer: Self-pay | Admitting: Rehabilitative and Restorative Service Providers"

## 2020-06-25 ENCOUNTER — Other Ambulatory Visit: Payer: Self-pay

## 2020-06-25 ENCOUNTER — Ambulatory Visit: Payer: Medicare Other | Admitting: Rehabilitative and Restorative Service Providers"

## 2020-06-25 DIAGNOSIS — M6281 Muscle weakness (generalized): Secondary | ICD-10-CM | POA: Diagnosis not present

## 2020-06-25 DIAGNOSIS — R2689 Other abnormalities of gait and mobility: Secondary | ICD-10-CM

## 2020-06-25 DIAGNOSIS — R29818 Other symptoms and signs involving the nervous system: Secondary | ICD-10-CM

## 2020-06-25 DIAGNOSIS — M5441 Lumbago with sciatica, right side: Secondary | ICD-10-CM

## 2020-06-25 DIAGNOSIS — G8929 Other chronic pain: Secondary | ICD-10-CM

## 2020-06-25 NOTE — Therapy (Signed)
[facility] Physical Therapy [address] [place], [place], [address] Phone: [phone]   Fax:  [phone]  Physical Therapy Treatment  Patient Details  Name: [name] MRN: [id number] Date of Birth: 02-Dec-1933 Referring Provider (PT): Royal Hawthorn, MD   Encounter Date: 06/25/2020   PT End of Session - 06/25/20 1308    Visit Number 3    Number of Visits 16    Date for PT Re-Evaluation 09/04/20    Authorization Type UHC MCR    PT Start Time 1300    PT Stop Time 1339    PT Time Calculation (min) 39 min    Equipment Utilized During Treatment Gait belt    Activity Tolerance Patient limited by fatigue    Behavior During Therapy [facility] for tasks assessed/performed           Past Medical History:  Diagnosis Date  . AAA (abdominal aortic aneurysm) ([place])   . 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 CAD (coronary artery disease)    a. s/p CABGx4 in 07/2015.  . Carotid artery disease ([place])   . Colon polyps   . Diabetes ([place])   . Difficult intubation   . 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 Esophageal reflux   . High cholesterol   . History of IBS 02/27/2009  . Hypertension    pt denies, he says he has a h/o hypotension. If BP up he adjusts the Florinef  . Jejunitis with partial SBO 05/20/2017  . Orthostatic hypotension   . Prostatitis   . Stroke ([place])   . TIA (transient ischemic attack)   . Upper GI bleed 06/24/2018  . Vitamin B 12 deficiency   . Vitamin D deficiency     Past Surgical History:  Procedure Laterality Date  . BIOPSY  06/25/2018   Procedure: BIOPSY;  Surgeon: Rush Landmark Telford Nab., MD;  Location: [place];  Service: Gastroenterology;;  . BIOPSY  04/24/2019   Procedure: BIOPSY;  Surgeon: Irving Copas., MD;  Location[facility];  Service: Gastroenterology;;  . BLADDER SURGERY  1969   traumatic pelvic fractures, urethral and bladder repair  . CARDIAC CATHETERIZATION N/A 07/01/2015   Procedure: Left Heart Cath and Coronary  Angiography;  Surgeon: Wellington Hampshire, MD;  Location: [place] [facility];  Service: Cardiovascular;  Laterality: N/A;  . COLON RESECTION N/A 05/17/2017   Procedure: DIAGNOSTIC LAPAROSCOPY,;  Surgeon: Leighton Ruff, MD;  [facility];  Service: General;  Laterality: N/A;  . COLONOSCOPY WITH PROPOFOL N/A 04/24/2019   Procedure: COLONOSCOPY WITH PROPOFOL;  Surgeon: Irving Copas., MD;  [facility];  Service: Gastroenterology;  Laterality: N/A;  . CORONARY ANGIOPLASTY WITH STENT PLACEMENT    . CORONARY ARTERY BYPASS GRAFT N/A 07/06/2015   Procedure: CORONARY ARTERY BYPASS GRAFTING (CABG)x 4   utilizing the left internal mammary artery and endoscopically harvested bilateral  sapheneous vein.;  Surgeon: Ivin Poot, MD;  Location: [place];  Service: Open Heart Surgery;  Laterality: N/A;  . ESOPHAGEAL DILATION  04/24/2019   Procedure: ESOPHAGEAL DILATION;  Surgeon: Rush Landmark Telford Nab., MD;  [facility];  Service: Gastroenterology;;  . ESOPHAGOGASTRODUODENOSCOPY (EGD) WITH PROPOFOL N/A 06/25/2018   Procedure: ESOPHAGOGASTRODUODENOSCOPY (EGD) WITH PROPOFOL;  Surgeon: Irving Copas., MD;  Location: [place];  Service: Gastroenterology;  Laterality: N/A;  . ESOPHAGOGASTRODUODENOSCOPY (EGD) WITH PROPOFOL N/A 04/24/2019   Procedure: ESOPHAGOGASTRODUODENOSCOPY (EGD) WITH PROPOFOL;  Surgeon: Rush Landmark Telford Nab., MD;  [facility];  Service: Gastroenterology;  Laterality: N/A;  . FEMUR  IM NAIL Right 11/16/2019   Procedure: RIGHT HIP INTRAMEDULLARY (IM) NAIL FEMORAL;  Surgeon: Meredith Pel, MD;  Location: [place];  Service: Orthopedics;  Laterality: Right;  . HEMOSTASIS CLIP PLACEMENT  04/24/2019   Procedure: HEMOSTASIS CLIP PLACEMENT;  Surgeon: Irving Copas., MD;  [facility];  Service: Gastroenterology;;  . KNEE SURGERY    . LOOP RECORDER INSERTION N/A 02/19/2018   Procedure: LOOP RECORDER INSERTION;  Surgeon: Deboraha Sprang, MD;  Location: [place] [facility];  Service: Cardiovascular;  Laterality: N/A;  . POLYPECTOMY  04/24/2019   Procedure: POLYPECTOMY;  Surgeon: Rush Landmark Telford Nab., MD;  Location[facility];  Service: Gastroenterology;;  . Lia Foyer LIFTING INJECTION  04/24/2019   Procedure: SUBMUCOSAL LIFTING INJECTION;  Surgeon: Irving Copas., MD;  [facility];  Service: Gastroenterology;;  . TEE WITHOUT CARDIOVERSION N/A 07/06/2015   Procedure: TRANSESOPHAGEAL ECHOCARDIOGRAM (TEE);  Surgeon: Ivin Poot, MD;  Location: [place];  Service: Open Heart Surgery;  Laterality: N/A;    There were no vitals filed for this visit.   Subjective Assessment - 06/25/20 1307    Subjective Pt. indicated having back pain at 6/10 upon arrival today, noted primarily between shoulders/upper back.    Pertinent History PMH: repeated falls, pelvic fx, CVA X5, DM, CKD, CAD, CABGX4,mild dementia    Limitations Lifting;Standing;Walking;House hold activities    How long can you stand comfortably? one minute    Diagnostic tests see chart for lumbar/pelvic MRI and XR    Patient Stated Goals get legs stronger and walk again    Pain Score 6     Pain Location Thoracic    Pain Orientation Posterior    Pain Onset More than a month ago                             [facility] Adult PT Treatment/Exercise - 06/25/20 0001      Therapeutic Activites    Other Therapeutic Activities sit to stand 24 inch bar stool 2 x 5 no UE, CGA, ambulation c FWW CGA 50 ft, 100 ft c cues for step through gait pattern      Knee/Hip Exercises: Aerobic   Nustep Lvl 5 5 mins, rest, 5 mins      Knee/Hip Exercises: Standing   Hip Abduction AROM;Stengthening;1 set;10 reps;Knee straight    Abduction Limitations UE assist on anterior // bar    Other Standing Knee Exercises 6 inch box alt toe tapping c bilateral UE x 10 each, Lt UE only assist x 10 (in // bars, CGA)      Knee/Hip Exercises: Seated   Other  Seated Knee/Hip Exercises seated tband rows green on bar stool for core stability focus 2 x 15                    PT Short Term Goals - 06/25/20 1350      PT SHORT TERM GOAL #1   Title independent with initial HEP (Target for all STGs 07/10/20)    Time 4    Period Weeks    Status On-going      PT SHORT TERM GOAL #2   Title Patient will ambulate with LRAD CGA at least 100 ft    Baseline 50 ft with RW    Status On-going      PT SHORT TERM GOAL #3   Title -    Baseline -      PT  SHORT TERM GOAL #4   Title -             PT Long Term Goals - 06/12/20 1407      PT LONG TERM GOAL #1   Title independent with updated HEP (Target all LTG 12 weeks  09/04/20)    Time 12    Period Weeks    Status New      PT LONG TERM GOAL #2   Title improve 5TSTS test to less than 25 seconds to show improved leg strength/endurance    Baseline 37    Time 12    Period Weeks    Status New      PT LONG TERM GOAL #3   Title Improve bilat leg strength to at least 4+/5 MMT measured in sitting to improve function    Baseline 4- to 4    Time 12    Period Weeks    Status New      PT LONG TERM GOAL #4   Title Will improve TUG to less than 30 sec to show improved balance and gait speed.    Baseline 46.9 with RW around cone    Time 12    Period Weeks    Status New      PT LONG TERM GOAL #5   Title Ambulate with LRAD mod I  for 300 ft or stand at least 10 min    Baseline 50 ft with RW    Time 12    Period Weeks    Status New                 Plan - 06/25/20 1349    Clinical Impression Statement Fatigue still present throughout but rest provided ability to continue through session.  Ambulation distance increased.  Able to perform step through gait pattern following verbal cues.  Overall movement coordination and strength in bilateral LE to be improved through continued skilled PT services.    Personal Factors and Comorbidities Comorbidity 3+;Time since onset of  injury/illness/exacerbation;Past/Current Experience;Fitness    Comorbidities PMH: repeated falls, pelvic fx, CVA X5, DM, CKD, CAD, CABGX4,mild dementia    Examination-Activity Limitations Bathing;Bed Mobility;Bend;Carry;Lift;Stand;Toileting;Stairs;Squat;Locomotion Level    Examination-Participation Restrictions Art gallery manager;Shop    Stability/Clinical Decision Making Evolving/Moderate complexity    Rehab Potential Fair    PT Frequency 2x / week    PT Duration 8 weeks    PT Treatment/Interventions Aquatic Therapy;Cryotherapy;Dentist;Therapeutic activities;Therapeutic exercise;Neuromuscular re-education;Manual techniques;Patient/family education;Passive range of motion;Taping    PT Next Visit Plan leg strength, endurance, balance, gait with RW/rollator    PT Home Exercise Plan [facility]    Consulted and Agree with Plan of Care Patient           Patient will benefit from skilled therapeutic intervention in order to improve the following deficits and impairments:  Abnormal gait,Decreased activity tolerance,Decreased balance,Decreased endurance,Decreased coordination,Decreased range of motion,Decreased strength,Difficulty walking,Postural dysfunction,Pain,Improper body mechanics  Visit Diagnosis: Other abnormalities of gait and mobility  Chronic bilateral low back pain with right-sided sciatica  Muscle weakness (generalized)  Other symptoms and signs involving the nervous system     Problem List Patient Active Problem List   Diagnosis Date Noted  . Situational depression 01/22/2020  . Urinary frequency 01/22/2020  . Bacteremia due to Staphylococcus epidermidis 11/24/2019  . Stroke (cerebrum) ([place])   . Elevated troponin   . Closed right hip fracture ([place]) 11/15/2019  . Unilateral primary osteoarthritis, right knee 11/13/2019  . At high risk  for falls 05/29/2019  . Chronic bilateral low back pain without sciatica  02/07/2019  . Statin intolerance 11/16/2018  . Hyperlipidemia, mixed 08/15/2018  . Low back pain 07/18/2018  . History of lacunar cerebrovascular accident (CVA) 02/12/2018  . Dizziness 02/12/2018  . Myofascial pain 12/18/2017  . Spondylosis without myelopathy or radiculopathy, lumbar region 12/18/2017  . Lumbar radiculopathy, right 12/18/2017  . Cholelithiasis 05/17/2017  . Sinus Bradycardia 05/17/2017  . Thoracic radiculopathy 04/12/2017  . Primary osteoarthritis of right knee 12/14/2016  . DDD (degenerative disc disease), lumbar 10/10/2016  . Lumbar facet arthropathy 08/29/2016  . Idiopathic scoliosis 03/22/2016  . Diabetic neuropathy ([place]) 03/22/2016  . Dysautonomia orthostatic hypotension syndrome 02/25/2016  . CKD stage 3 due to type 2 diabetes mellitus ([place]) 02/19/2016  . Coronary artery disease involving coronary bypass graft of native heart without angina pectoris   . Diabetes mellitus type 2, diet-controlled ([place])   . 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 Medication management 08/11/2015  . 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 PVD (peripheral vascular disease) ([place]) 01/01/2014  . Hyperlipidemia associated with type 2 diabetes mellitus ([place]) 04/11/2013  . Supine hypertension   . Vitamin D deficiency   . Vitamin B 12 deficiency   . Coronary atherosclerosis- s/p PCI to LAD in 2009 and PCI to RCA in 2011 02/27/2009  . Abdominal aortic aneurysm ([place]) 02/27/2009  . GERD 02/27/2009    Scot Jun, PT, DPT, OCS, ATC 06/25/20  1:51 PM    [facility] [address] [place], [place], [address] Phone: [phone]   Fax:  [phone]  Name: [name] MRN: [id number] Date of Birth: Jan 18, 1934

## 2020-06-26 NOTE — Progress Notes (Signed)
Carelink Summary Report / Loop Recorder 

## 2020-06-30 ENCOUNTER — Other Ambulatory Visit: Payer: Self-pay

## 2020-06-30 ENCOUNTER — Ambulatory Visit: Payer: Medicare Other | Admitting: Physical Therapy

## 2020-06-30 DIAGNOSIS — M545 Low back pain, unspecified: Secondary | ICD-10-CM

## 2020-06-30 DIAGNOSIS — R29818 Other symptoms and signs involving the nervous system: Secondary | ICD-10-CM

## 2020-06-30 DIAGNOSIS — M6281 Muscle weakness (generalized): Secondary | ICD-10-CM

## 2020-06-30 DIAGNOSIS — R2689 Other abnormalities of gait and mobility: Secondary | ICD-10-CM | POA: Diagnosis not present

## 2020-06-30 DIAGNOSIS — G8929 Other chronic pain: Secondary | ICD-10-CM

## 2020-06-30 DIAGNOSIS — M5441 Lumbago with sciatica, right side: Secondary | ICD-10-CM

## 2020-06-30 NOTE — Therapy (Signed)
[facility] Physical Therapy [address] [place], [place], [address] Phone: [phone]   Fax:  [phone]  Physical Therapy Treatment  Patient Details  Name: [name] MRN: [id number] Date of Birth: [birth year] Referring Provider (PT): Royal Hawthorn, MD   Encounter Date: 06/30/2020   PT End of Session - 06/30/20 1551    Visit Number 4    Number of Visits 16    Date for PT Re-Evaluation 09/04/20    Authorization Type UHC MCR    PT Start Time 1430    PT Stop Time 1510    PT Time Calculation (min) 40 min    Equipment Utilized During Treatment Gait belt    Activity Tolerance Patient limited by fatigue    Behavior During Therapy [facility] for tasks assessed/performed           Past Medical History:  Diagnosis Date  . AAA (abdominal aortic aneurysm) ([place])   . 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 CAD (coronary artery disease)    a. s/p CABGx4 in 07/2015.  . Carotid artery disease ([place])   . Colon polyps   . Diabetes ([place])   . Difficult intubation   . 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 Esophageal reflux   . High cholesterol   . History of IBS 02/27/2009  . Hypertension    pt denies, he says he has a h/o hypotension. If BP up he adjusts the Florinef  . Jejunitis with partial SBO 05/20/2017  . Orthostatic hypotension   . Prostatitis   . Stroke ([place])   . TIA (transient ischemic attack)   . Upper GI bleed 06/24/2018  . Vitamin B 12 deficiency   . Vitamin D deficiency     Past Surgical History:  Procedure Laterality Date  . BIOPSY  06/25/2018   Procedure: BIOPSY;  Surgeon: Rush Landmark Telford Nab., MD;  Location: [place];  Service: Gastroenterology;;  . BIOPSY  04/24/2019   Procedure: BIOPSY;  Surgeon: Irving Copas., MD;  Location: [facility];  Service: Gastroenterology;;  . BLADDER SURGERY  1969   traumatic pelvic fractures, urethral and bladder repair  . CARDIAC CATHETERIZATION N/A 07/01/2015   Procedure: Left Heart Cath and Coronary  Angiography;  Surgeon: Wellington Hampshire, MD;  Location: [place] [facility];  Service: Cardiovascular;  Laterality: N/A;  . COLON RESECTION N/A 05/17/2017   Procedure: DIAGNOSTIC LAPAROSCOPY,;  Surgeon: Leighton Ruff, MD;  [facility]: [facility];  Service: General;  Laterality: N/A;  . COLONOSCOPY WITH PROPOFOL N/A 04/24/2019   Procedure: COLONOSCOPY WITH PROPOFOL;  Surgeon: Irving Copas., MD;  [facility];  Service: Gastroenterology;  Laterality: N/A;  . CORONARY ANGIOPLASTY WITH STENT PLACEMENT    . CORONARY ARTERY BYPASS GRAFT N/A 07/06/2015   Procedure: CORONARY ARTERY BYPASS GRAFTING (CABG)x 4   utilizing the left internal mammary artery and endoscopically harvested bilateral  sapheneous vein.;  Surgeon: Ivin Poot, MD;  Location: [place];  Service: Open Heart Surgery;  Laterality: N/A;  . ESOPHAGEAL DILATION  04/24/2019   Procedure: ESOPHAGEAL DILATION;  Surgeon: Rush Landmark Telford Nab., MD;  [facility];  Service: Gastroenterology;;  . ESOPHAGOGASTRODUODENOSCOPY (EGD) WITH PROPOFOL N/A 06/25/2018   Procedure: ESOPHAGOGASTRODUODENOSCOPY (EGD) WITH PROPOFOL;  Surgeon: Irving Copas., MD;  Location: [place];  Service: Gastroenterology;  Laterality: N/A;  . ESOPHAGOGASTRODUODENOSCOPY (EGD) WITH PROPOFOL N/A 04/24/2019   Procedure: ESOPHAGOGASTRODUODENOSCOPY (EGD) WITH PROPOFOL;  Surgeon: Rush Landmark Telford Nab., MD;  [facility];  Service: Gastroenterology;  Laterality: N/A;  . FEMUR  IM NAIL Right 11/16/2019   Procedure: RIGHT HIP INTRAMEDULLARY (IM) NAIL FEMORAL;  Surgeon: Meredith Pel, MD;  Location: [place];  Service: Orthopedics;  Laterality: Right;  . HEMOSTASIS CLIP PLACEMENT  04/24/2019   Procedure: HEMOSTASIS CLIP PLACEMENT;  Surgeon: Irving Copas., MD;  [facility];  Service: Gastroenterology;;  . KNEE SURGERY    . LOOP RECORDER INSERTION N/A 02/19/2018   Procedure: LOOP RECORDER INSERTION;  Surgeon: Deboraha Sprang, MD;  Location: [place] [facility];  Service: Cardiovascular;  Laterality: N/A;  . POLYPECTOMY  04/24/2019   Procedure: POLYPECTOMY;  Surgeon: Rush Landmark Telford Nab., MD;  Location: [facility];  Service: Gastroenterology;;  . Lia Foyer LIFTING INJECTION  04/24/2019   Procedure: SUBMUCOSAL LIFTING INJECTION;  Surgeon: Irving Copas., MD;  [facility];  Service: Gastroenterology;;  . TEE WITHOUT CARDIOVERSION N/A 07/06/2015   Procedure: TRANSESOPHAGEAL ECHOCARDIOGRAM (TEE);  Surgeon: Ivin Poot, MD;  Location: [place];  Service: Open Heart Surgery;  Laterality: N/A;    There were no vitals filed for this visit.   Subjective Assessment - 06/30/20 1550    Subjective He relays overall 7-8/10 pain in his Rt leg today.    Pertinent History PMH: repeated falls, pelvic fx, CVA X5, DM, CKD, CAD, CABGX4,mild dementia    Limitations Lifting;Standing;Walking;House hold activities    How long can you stand comfortably? one minute    Diagnostic tests see chart for lumbar/pelvic MRI and XR    Patient Stated Goals get legs stronger and walk again    Pain Onset More than a month ago            [facility] Adult PT Treatment/Exercise - 06/30/20 0001      Ambulation/Gait   Ambulation/Gait Yes    Ambulation/Gait Assistance 4: Min guard    Ambulation Distance (Feet) 100 Feet   X2   Assistive device --   one lap rollator, one lap RW 100 ft ea     Knee/Hip Exercises: Aerobic   Nustep L4 5 min, one min rest then another 5 min      Knee/Hip Exercises: Machines for Strengthening   Total Gym Leg Press DL 50# 2X10      Knee/Hip Exercises: Standing   Hip Abduction Both;10 reps    Abduction Limitations with UE support    Functional Squat Limitations mini squats with UE support and chair behind him 10 reps                    PT Short Term Goals - 06/30/20 1553      PT SHORT TERM GOAL #1   Title independent with initial HEP (Target for all STGs 07/10/20)    Time 4     Period Weeks    Status Achieved      PT SHORT TERM GOAL #2   Title Patient will ambulate with LRAD CGA at least 100 ft    Baseline 50 ft with RW    Status Achieved      PT SHORT TERM GOAL #3   Title -    Baseline -      PT SHORT TERM GOAL #4   Title -             PT Long Term Goals - 06/30/20 1553      PT LONG TERM GOAL #1   Title independent with updated HEP (Target all LTG 12 weeks  09/04/20)    Time 12    Period Weeks  Status On-going      PT LONG TERM GOAL #2   Title improve 5TSTS test to less than 25 seconds to show improved leg strength/endurance    Baseline 37    Time 12    Period Weeks    Status On-going      PT LONG TERM GOAL #3   Title Improve bilat leg strength to at least 4+/5 MMT measured in sitting to improve function    Baseline 4- to 4    Time 12    Period Weeks    Status On-going      PT LONG TERM GOAL #4   Title Will improve TUG to less than 30 sec to show improved balance and gait speed.    Baseline 46.9 with RW around cone    Time 12    Period Weeks    Status On-going      PT LONG TERM GOAL #5   Title Ambulate with LRAD mod I  for 300 ft or stand at least 10 min    Baseline 100 ft on 3/1 with RW    Time 12    Period Weeks    Status On-going                 Plan - 06/30/20 1552    Clinical Impression Statement Continued to work to improve his endurnce, gait, balance, and overall leg strength. he was limited some by leg strength. Attempted gait with rollator but he preferred RW instead. He was however able to show improved overall ambulation distance with RW and has now met this short term goal.    Personal Factors and Comorbidities Comorbidity 3+;Time since onset of injury/illness/exacerbation;Past/Current Experience;Fitness    Comorbidities PMH: repeated falls, pelvic fx, CVA X5, DM, CKD, CAD, CABGX4,mild dementia    Examination-Activity Limitations Bathing;Bed Mobility;Bend;Carry;Lift;Stand;Toileting;Stairs;Squat;Locomotion  Level    Examination-Participation Restrictions Art gallery manager;Shop    Stability/Clinical Decision Making Evolving/Moderate complexity    Rehab Potential Fair    PT Frequency 2x / week    PT Duration 8 weeks    PT Treatment/Interventions Aquatic Therapy;Cryotherapy;Dentist;Therapeutic activities;Therapeutic exercise;Neuromuscular re-education;Manual techniques;Patient/family education;Passive range of motion;Taping    PT Next Visit Plan leg strength, endurance, balance, gait with RW/rollator    PT Home Exercise Plan [facility]    Consulted and Agree with Plan of Care Patient           Patient will benefit from skilled therapeutic intervention in order to improve the following deficits and impairments:  Abnormal gait,Decreased activity tolerance,Decreased balance,Decreased endurance,Decreased coordination,Decreased range of motion,Decreased strength,Difficulty walking,Postural dysfunction,Pain,Improper body mechanics  Visit Diagnosis: Chronic bilateral low back pain with right-sided sciatica  Other abnormalities of gait and mobility  Muscle weakness (generalized)  Other symptoms and signs involving the nervous system  Chronic bilateral low back pain, unspecified whether sciatica present     Problem List Patient Active Problem List   Diagnosis Date Noted  . Situational depression 01/22/2020  . Urinary frequency 01/22/2020  . Bacteremia due to Staphylococcus epidermidis 11/24/2019  . Stroke (cerebrum) ([place])   . Elevated troponin   . Closed right hip fracture ([place]) 11/15/2019  . Unilateral primary osteoarthritis, right knee 11/13/2019  . At high risk for falls 05/29/2019  . Chronic bilateral low back pain without sciatica 02/07/2019  . Statin intolerance 11/16/2018  . Hyperlipidemia, mixed 08/15/2018  . Low back pain 07/18/2018  . History of lacunar cerebrovascular accident (CVA) 02/12/2018  . Dizziness  02/12/2018  . Myofascial pain  12/18/2017  . Spondylosis without myelopathy or radiculopathy, lumbar region 12/18/2017  . Lumbar radiculopathy, right 12/18/2017  . Cholelithiasis 05/17/2017  . Sinus Bradycardia 05/17/2017  . Thoracic radiculopathy 04/12/2017  . Primary osteoarthritis of right knee 12/14/2016  . DDD (degenerative disc disease), lumbar 10/10/2016  . Lumbar facet arthropathy 08/29/2016  . Idiopathic scoliosis 03/22/2016  . Diabetic neuropathy ([place]) 03/22/2016  . Dysautonomia orthostatic hypotension syndrome 02/25/2016  . CKD stage 3 due to type 2 diabetes mellitus ([place]) 02/19/2016  . Coronary artery disease involving coronary bypass graft of native heart without angina pectoris   . Diabetes mellitus type 2, diet-controlled ([place])   . 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 Medication management 08/11/2015  . 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 PVD (peripheral vascular disease) ([place]) 01/01/2014  . Hyperlipidemia associated with type 2 diabetes mellitus ([place]) 04/11/2013  . Supine hypertension   . Vitamin D deficiency   . Vitamin B 12 deficiency   . Coronary atherosclerosis- s/p PCI to LAD in 2009 and PCI to RCA in 2011 02/27/2009  . Abdominal aortic aneurysm ([place]) 02/27/2009  . GERD 02/27/2009    [name], PT,DPT 06/30/2020, 3:54 PM  [facility] Physical Therapy [address] [place], [place], [address] Phone: [phone]   Fax:  [phone]  Name: CARTIER WASHKO MRN: [id number] Date of Birth: 1933-07-19

## 2020-07-02 ENCOUNTER — Other Ambulatory Visit: Payer: Self-pay

## 2020-07-02 ENCOUNTER — Ambulatory Visit: Payer: Medicare Other | Admitting: Physical Therapy

## 2020-07-02 DIAGNOSIS — M6281 Muscle weakness (generalized): Secondary | ICD-10-CM

## 2020-07-02 DIAGNOSIS — R29818 Other symptoms and signs involving the nervous system: Secondary | ICD-10-CM

## 2020-07-02 DIAGNOSIS — M5441 Lumbago with sciatica, right side: Secondary | ICD-10-CM | POA: Diagnosis not present

## 2020-07-02 DIAGNOSIS — R2689 Other abnormalities of gait and mobility: Secondary | ICD-10-CM

## 2020-07-02 DIAGNOSIS — M545 Low back pain, unspecified: Secondary | ICD-10-CM

## 2020-07-02 DIAGNOSIS — G8929 Other chronic pain: Secondary | ICD-10-CM

## 2020-07-02 NOTE — Therapy (Signed)
[facility] Therapy [address] [place], [place], [address] Phone: [phone]   Fax:  [phone]  Physical Therapy Treatment  Patient Details  Name: [name] MRN: [id number] Date of Birth: Feb 08, 1934 Referring Provider (PT): Royal Hawthorn, MD   Encounter Date: 07/02/2020   PT End of Session - 07/02/20 1352    Visit Number 5    Number of Visits 16    Date for PT Re-Evaluation 09/04/20    Authorization Type UHC MCR    PT Start Time 1300    PT Stop Time 1345    PT Time Calculation (min) 45 min    Equipment Utilized During Treatment Gait belt    Activity Tolerance Patient limited by fatigue    Behavior During Therapy [facility] for tasks assessed/performed           Past Medical History:  Diagnosis Date  . AAA (abdominal aortic aneurysm) ([place])   . 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 CAD (coronary artery disease)    a. s/p CABGx4 in 07/2015.  . Carotid artery disease ([place])   . Colon polyps   . Diabetes ([place])   . Difficult intubation   . 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 Esophageal reflux   . High cholesterol   . History of IBS 02/27/2009  . Hypertension    pt denies, he says he has a h/o hypotension. If BP up he adjusts the Flor[place]f  . Jejunitis with partial SBO 05/20/2017  . Orthostatic hypotension   . Prostatitis   . Stroke ([place])   . TIA (transient ischemic attack)   . Upper GI bleed 06/24/2018  . Vitamin B 12 deficiency   . Vitamin D deficiency     Past Surgical History:  Procedure Laterality Date  . BIOPSY  06/25/2018   Procedure: BIOPSY;  Surgeon: Rush Landmark Telford Nab., MD;  Location: [place];  Service: Gastroenterology;;  . BIOPSY  04/24/2019   Procedure: BIOPSY;  Surgeon: Irving Copas., MD;  Location: [facility];  Service: Gastroenterology;;  . BLADDER SURGERY  1969   traumatic pelvic fractures, urethral and bladder repair  . CARDIAC CATHETERIZATION N/A 07/01/2015   Procedure: Left Heart Cath and Coronary  Angiography;  Surgeon: Wellington Hampshire, MD;  Location: [place] [facility];  Service: Cardiovascular;  Laterality: N/A;  . COLON RESECTION N/A 05/17/2017   Procedure: DIAGNOSTIC LAPAROSCOPY,;  Surgeon: Leighton Ruff, MD;  [facility];  Service: General;  Laterality: N/A;  . COLONOSCOPY WITH PROPOFOL N/A 04/24/2019   Procedure: COLONOSCOPY WITH PROPOFOL;  Surgeon: Irving Copas., MD;  [facility];  Service: Gastroenterology;  Laterality: N/A;  . CORONARY ANGIOPLASTY WITH STENT PLACEMENT    . CORONARY ARTERY BYPASS GRAFT N/A 07/06/2015   Procedure: CORONARY ARTERY BYPASS GRAFTING (CABG)x 4   utilizing the left internal mammary artery and endoscopically harvested bilateral  sapheneous vein.;  Surgeon: Ivin Poot, MD;  Location: [place];  Service: Open Heart Surgery;  Laterality: N/A;  . ESOPHAGEAL DILATION  04/24/2019   Procedure: ESOPHAGEAL DILATION;  Surgeon: Rush Landmark Telford Nab., MD;  [facility];  Service: Gastroenterology;;  . ESOPHAGOGASTRODUODENOSCOPY (EGD) WITH PROPOFOL N/A 06/25/2018   Procedure: ESOPHAGOGASTRODUODENOSCOPY (EGD) WITH PROPOFOL;  Surgeon: Irving Copas., MD;  Location: [place];  Service: Gastroenterology;  Laterality: N/A;  . ESOPHAGOGASTRODUODENOSCOPY (EGD) WITH PROPOFOL N/A 04/24/2019   Procedure: ESOPHAGOGASTRODUODENOSCOPY (EGD) WITH PROPOFOL;  Surgeon: Rush Landmark Telford Nab., MD;  [facility];  Service: Gastroenterology;  Laterality: N/A;  . FEMUR  IM NAIL Right 11/16/2019   Procedure: RIGHT HIP INTRAMEDULLARY (IM) NAIL FEMORAL;  Surgeon: Meredith Pel, MD;  Location: [place];  Service: Orthopedics;  Laterality: Right;  . HEMOSTASIS CLIP PLACEMENT  04/24/2019   Procedure: HEMOSTASIS CLIP PLACEMENT;  Surgeon: Irving Copas., MD;  [facility];  Service: Gastroenterology;;  . KNEE SURGERY    . LOOP RECORDER INSERTION N/A 02/19/2018   Procedure: LOOP RECORDER INSERTION;  Surgeon: Deboraha Sprang, MD;  Location: [place] [facility];  Service: Cardiovascular;  Laterality: N/A;  . POLYPECTOMY  04/24/2019   Procedure: POLYPECTOMY;  Surgeon: Rush Landmark Telford Nab., MD;  Location[facility];  Service: Gastroenterology;;  . Lia Foyer LIFTING INJECTION  04/24/2019   Procedure: SUBMUCOSAL LIFTING INJECTION;  Surgeon: Irving Copas., MD;  [facility];  Service: Gastroenterology;;  . TEE WITHOUT CARDIOVERSION N/A 07/06/2015   Procedure: TRANSESOPHAGEAL ECHOCARDIOGRAM (TEE);  Surgeon: Ivin Poot, MD;  Location: [place];  Service: Open Heart Surgery;  Laterality: N/A;    There were no vitals filed for this visit.   Subjective Assessment - 07/02/20 1314    Subjective He relays overall 5/10 pain in his legs today. His wife reports he is walking more at home.    Pertinent History PMH: repeated falls, pelvic fx, CVA X5, DM, CKD, CAD, CABGX4,mild dementia    Limitations Lifting;Standing;Walking;House hold activities    How long can you stand comfortably? one minute    Diagnostic tests see chart for lumbar/pelvic MRI and XR    Patient Stated Goals get legs stronger and walk again    Pain Onset More than a month ago              [facility] PT Assessment - 07/02/20 0001      Assessment   Medical Diagnosis gait abnormaility, Spinal stenosis of lumbar region with neurogenic claudication, bilat leg weakness Rt worse than Lt.    Referring Provider (PT) Royal Hawthorn, MD      Ambulation/Gait   Ambulation/Gait Yes    Ambulation/Gait Assistance 4: Min guard    Ambulation/Gait Assistance Details verbal cues for step through pattern as he started out preferring to use step to pattern    Ambulation Distance (Feet) 120 Feet   then 100   Assistive device Rolling walker      Standardized Balance Assessment   Five times sit to stand comments  28 seconds using UE to push up and one UE to grab RW for balance PRN            [facility] Adult PT Treatment/Exercise - 07/02/20 0001       Neuro Re-ed    Neuro Re-ed Details  modified tandem balance in RW 10 sec X 3 bilat      Knee/Hip Exercises: Aerobic   Nustep L5  X10 min      Knee/Hip Exercises: Machines for Strengthening   Total Gym Leg Press DL 50# 3X10      Knee/Hip Exercises: Standing   Hip Abduction Both;15 reps    Abduction Limitations with UE support    Functional Squat Limitations mini squats with UE support and chair behind him 10 reps      Knee/Hip Exercises: Seated   Long Arc Quad Both;2 sets;10 reps    Long Arc Quad Weight 4 lbs.                    PT Short Term Goals - 06/30/20 1553      PT  SHORT TERM GOAL #1   Title independent with initial HEP (Target for all STGs 07/10/20)    Time 4    Period Weeks    Status Achieved      PT SHORT TERM GOAL #2   Title Patient will ambulate with LRAD CGA at least 100 ft    Baseline 50 ft with RW    Status Achieved      PT SHORT TERM GOAL #3   Title -    Baseline -      PT SHORT TERM GOAL #4   Title -             PT Long Term Goals - 07/02/20 1354      PT LONG TERM GOAL #1   Title independent with updated HEP (Target all LTG 12 weeks  09/04/20)    Time 12    Period Weeks    Status On-going      PT LONG TERM GOAL #2   Title improve 5TSTS test to less than 25 seconds to show improved leg strength/endurance    Baseline 29 sec on 3/3    Time 12    Period Weeks    Status On-going      PT LONG TERM GOAL #3   Title Improve bilat leg strength to at least 4+/5 MMT measured in sitting to improve function    Baseline 4- to 4    Time 12    Period Weeks    Status On-going      PT LONG TERM GOAL #4   Title Will improve TUG to less than 30 sec to show improved balance and gait speed.    Baseline 46.9 with RW around cone    Time 12    Period Weeks    Status On-going      PT LONG TERM GOAL #5   Title Ambulate with LRAD mod I  for 300 ft or stand at least 10 min    Baseline 120 ft on 3/3 with RW    Time 12    Period Weeks    Status  On-going                 Plan - 07/02/20 1352    Clinical Impression Statement He showed improvments in endurance and step through pattern with gait today. His 5TSTS test also improved today ,see updated measurments. We will continue to progress him as tolerated.    Personal Factors and Comorbidities Comorbidity 3+;Time since onset of injury/illness/exacerbation;Past/Current Experience;Fitness    Comorbidities PMH: repeated falls, pelvic fx, CVA X5, DM, CKD, CAD, CABGX4,mild dementia    Examination-Activity Limitations Bathing;Bed Mobility;Bend;Carry;Lift;Stand;Toileting;Stairs;Squat;Locomotion Level    Examination-Participation Restrictions Art gallery manager;Shop    Stability/Clinical Decision Making Evolving/Moderate complexity    Rehab Potential Fair    PT Frequency 2x / week    PT Duration 8 weeks    PT Treatment/Interventions Aquatic Therapy;Cryotherapy;Dentist;Therapeutic activities;Therapeutic exercise;Neuromuscular re-education;Manual techniques;Patient/family education;Passive range of motion;Taping    PT Next Visit Plan leg strength, endurance, balance, gait with RW/rollator    PT Home Exercise Plan [facility]    Consulted and Agree with Plan of Care Patient           Patient will benefit from skilled therapeutic intervention in order to improve the following deficits and impairments:  Abnormal gait,Decreased activity tolerance,Decreased balance,Decreased endurance,Decreased coordination,Decreased range of motion,Decreased strength,Difficulty walking,Postural dysfunction,Pain,Improper body mechanics  Visit Diagnosis: Chronic bilateral low back pain with right-sided sciatica  Other abnormalities  of gait and mobility  Muscle weakness (generalized)  Other symptoms and signs involving the nervous system  Chronic bilateral low back pain, unspecified whether sciatica present     Problem List Patient  Active Problem List   Diagnosis Date Noted  . Situational depression 01/22/2020  . Urinary frequency 01/22/2020  . Bacteremia due to Staphylococcus epidermidis 11/24/2019  . Stroke (cerebrum) ([place])   . Elevated troponin   . Closed right hip fracture ([place]) 11/15/2019  . Unilateral primary osteoarthritis, right knee 11/13/2019  . At high risk for falls 05/29/2019  . Chronic bilateral low back pain without sciatica 02/07/2019  . Statin intolerance 11/16/2018  . Hyperlipidemia, mixed 08/15/2018  . Low back pain 07/18/2018  . History of lacunar cerebrovascular accident (CVA) 02/12/2018  . Dizziness 02/12/2018  . Myofascial pain 12/18/2017  . Spondylosis without myelopathy or radiculopathy, lumbar region 12/18/2017  . Lumbar radiculopathy, right 12/18/2017  . Cholelithiasis 05/17/2017  . Sinus Bradycardia 05/17/2017  . Thoracic radiculopathy 04/12/2017  . Primary osteoarthritis of right knee 12/14/2016  . DDD (degenerative disc disease), lumbar 10/10/2016  . Lumbar facet arthropathy 08/29/2016  . Idiopathic scoliosis 03/22/2016  . Diabetic neuropathy ([place]) 03/22/2016  . Dysautonomia orthostatic hypotension syndrome 02/25/2016  . CKD stage 3 due to type 2 diabetes mellitus ([place]) 02/19/2016  . Coronary artery disease involving coronary bypass graft of native heart without angina pectoris   . Diabetes mellitus type 2, diet-controlled ([place])   . 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 Medication management 08/11/2015  . 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 PVD (peripheral vascular disease) ([place]) 01/01/2014  . Hyperlipidemia associated with type 2 diabetes mellitus ([place]) 04/11/2013  . Supine hypertension   . Vitamin D deficiency   . Vitamin B 12 deficiency   . Coronary atherosclerosis- s/p PCI to LAD in 2009 and PCI to RCA in 2011 02/27/2009  . Abdominal aortic aneurysm ([place]) 02/27/2009  . GERD 02/27/2009    Silvestre Mesi 07/02/2020, 1:54 PM  [facility] Physical Therapy [address] [place], [place], [address] Phone: [phone]   Fax:  [phone]  Name: [name] MRN: [id number] Date of Birth: 09-13-1933

## 2020-07-07 ENCOUNTER — Ambulatory Visit: Payer: Medicare Other | Admitting: Physical Therapy

## 2020-07-07 ENCOUNTER — Encounter: Payer: Self-pay | Admitting: Physical Therapy

## 2020-07-07 ENCOUNTER — Other Ambulatory Visit: Payer: Self-pay

## 2020-07-07 DIAGNOSIS — R2689 Other abnormalities of gait and mobility: Secondary | ICD-10-CM

## 2020-07-07 DIAGNOSIS — R29818 Other symptoms and signs involving the nervous system: Secondary | ICD-10-CM | POA: Diagnosis not present

## 2020-07-07 DIAGNOSIS — G8929 Other chronic pain: Secondary | ICD-10-CM

## 2020-07-07 DIAGNOSIS — M5441 Lumbago with sciatica, right side: Secondary | ICD-10-CM | POA: Diagnosis not present

## 2020-07-07 DIAGNOSIS — M545 Low back pain, unspecified: Secondary | ICD-10-CM

## 2020-07-07 DIAGNOSIS — M6281 Muscle weakness (generalized): Secondary | ICD-10-CM

## 2020-07-07 NOTE — Therapy (Signed)
[facility] [address] [place], [place], [address] Phone: [phone]   Fax:  [phone]  Physical Therapy Treatment  Patient Details  Name: [name] MRN: [id number] Date of Birth: 03-Mar-1934 Referring Provider (PT): Royal Hawthorn, MD   Encounter Date: 07/07/2020   PT End of Session - 07/07/20 1356    Visit Number 6    Number of Visits 16    Date for PT Re-Evaluation 09/04/20    Authorization Type UHC MCR    PT Start Time 1300    PT Stop Time 1340    PT Time Calculation (min) 40 min    Equipment Utilized During Treatment Gait belt    Activity Tolerance Patient limited by fatigue    Behavior During Therapy [facility] for tasks assessed/performed           Past Medical History:  Diagnosis Date  . AAA (abdominal aortic aneurysm) ([place])   . 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 CAD (coronary artery disease)    a. s/p CABGx4 in 07/2015.  . Carotid artery disease ([place])   . Colon polyps   . Diabetes ([place])   . Difficult intubation   . 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 Esophageal reflux   . High cholesterol   . History of IBS 02/27/2009  . Hypertension    pt denies, he says he has a h/o hypotension. If BP up he adjusts the Florinef  . Jejunitis with partial SBO 05/20/2017  . Orthostatic hypotension   . Prostatitis   . Stroke ([place])   . TIA (transient ischemic attack)   . Upper GI bleed 06/24/2018  . Vitamin B 12 deficiency   . Vitamin D deficiency     Past Surgical History:  Procedure Laterality Date  . BIOPSY  06/25/2018   Procedure: BIOPSY;  Surgeon: Rush Landmark Telford Nab., MD;  Location: [place];  Service: Gastroenterology;;  . BIOPSY  04/24/2019   Procedure: BIOPSY;  Surgeon: Irving Copas., MD;  Location: [facility];  Service: Gastroenterology;;  . BLADDER SURGERY  1969   traumatic pelvic fractures, urethral and bladder repair  . CARDIAC CATHETERIZATION N/A 07/01/2015   Procedure: Left Heart Cath and Coronary  Angiography;  Surgeon: Wellington Hampshire, MD;  Location: [facility];  Service: Cardiovascular;  Laterality: N/A;  . COLON RESECTION N/A 05/17/2017   Procedure: DIAGNOSTIC LAPAROSCOPY,;  Surgeon: Leighton Ruff, MD;  [facility];  Service: General;  Laterality: N/A;  . COLONOSCOPY WITH PROPOFOL N/A 04/24/2019   Procedure: COLONOSCOPY WITH PROPOFOL;  Surgeon: Irving Copas., MD;  [facility];  Service: Gastroenterology;  Laterality: N/A;  . CORONARY ANGIOPLASTY WITH STENT PLACEMENT    . CORONARY ARTERY BYPASS GRAFT N/A 07/06/2015   Procedure: CORONARY ARTERY BYPASS GRAFTING (CABG)x 4   utilizing the left internal mammary artery and endoscopically harvested bilateral  sapheneous vein.;  Surgeon: Ivin Poot, MD;  Location: [place];  Service: Open Heart Surgery;  Laterality: N/A;  . ESOPHAGEAL DILATION  04/24/2019   Procedure: ESOPHAGEAL DILATION;  Surgeon: Rush Landmark Telford Nab., MD;  [facility];  Service: Gastroenterology;;  . ESOPHAGOGASTRODUODENOSCOPY (EGD) WITH PROPOFOL N/A 06/25/2018   Procedure: ESOPHAGOGASTRODUODENOSCOPY (EGD) WITH PROPOFOL;  Surgeon: Irving Copas., MD;  Location: [place];  Service: Gastroenterology;  Laterality: N/A;  . ESOPHAGOGASTRODUODENOSCOPY (EGD) WITH PROPOFOL N/A 04/24/2019   Procedure: ESOPHAGOGASTRODUODENOSCOPY (EGD) WITH PROPOFOL;  Surgeon: Rush Landmark Telford Nab., MD;  [facility];  Service: Gastroenterology;  Laterality: N/A;  . FEMUR  IM NAIL Right 11/16/2019   Procedure: RIGHT HIP INTRAMEDULLARY (IM) NAIL FEMORAL;  Surgeon: Meredith Pel, MD;  Location: [employer];  Service: Orthopedics;  Laterality: Right;  . HEMOSTASIS CLIP PLACEMENT  04/24/2019   Procedure: HEMOSTASIS CLIP PLACEMENT;  Surgeon: Irving Copas., MD;  [facility];  Service: Gastroenterology;;  . KNEE SURGERY    . LOOP RECORDER INSERTION N/A 02/19/2018   Procedure: LOOP RECORDER INSERTION;  Surgeon: Deboraha Sprang, MD;  Location: [place] [facility];  Service: Cardiovascular;  Laterality: N/A;  . POLYPECTOMY  04/24/2019   Procedure: POLYPECTOMY;  Surgeon: Rush Landmark Telford Nab., MD;  Location[facility];  Service: Gastroenterology;;  . Lia Foyer LIFTING INJECTION  04/24/2019   Procedure: SUBMUCOSAL LIFTING INJECTION;  Surgeon: Irving Copas., MD;  [facility];  Service: Gastroenterology;;  . TEE WITHOUT CARDIOVERSION N/A 07/06/2015   Procedure: TRANSESOPHAGEAL ECHOCARDIOGRAM (TEE);  Surgeon: Ivin Poot, MD;  Location: [place];  Service: Open Heart Surgery;  Laterality: N/A;    There were no vitals filed for this visit.   Subjective Assessment - 07/07/20 1315    Subjective His wife reports he was able to walk down a long sidewalk and up 3 steps at home. That he continues to do more activity at home. He does always have some leg pain and cramping however.    Pertinent History PMH: repeated falls, pelvic fx, CVA X5, DM, CKD, CAD, CABGX4,mild dementia    Limitations Lifting;Standing;Walking;House hold activities    How long can you stand comfortably? one minute    Diagnostic tests see chart for lumbar/pelvic MRI and XR    Patient Stated Goals get legs stronger and walk again    Pain Onset More than a month ago           [facility] Adult PT Treatment/Exercise - 07/07/20 0001      Ambulation/Gait   Ambulation/Gait Yes    Ambulation/Gait Assistance 4: Min guard    Ambulation/Gait Assistance Details verbal cues for step through pattern    Ambulation Distance (Feet) 120 Feet   25, 50   Assistive device Rolling walker      Neuro Re-ed    Neuro Re-ed Details  --      Knee/Hip Exercises: Aerobic   Nustep L5  X10 min      Knee/Hip Exercises: Machines for Strengthening   Total Gym Leg Press DL 56# 3X10, SL 25# 2 X10      Knee/Hip Exercises: Standing   Hip Abduction Both;15 reps    Abduction Limitations with UE support    Functional Squat Limitations mini squats with UE  support and chair behind him 10 reps    Other Standing Knee Exercises step ups 4 inch with UE support X 5 reps on left leg      Knee/Hip Exercises: Seated   Long Arc Quad Both;10 reps    Long Arc Quad Weight 4 lbs.                    PT Short Term Goals - 06/30/20 1553      PT SHORT TERM GOAL #1   Title independent with initial HEP (Target for all STGs 07/10/20)    Time 4    Period Weeks    Status Achieved      PT SHORT TERM GOAL #2   Title Patient will ambulate with LRAD CGA at least 100 ft    Baseline 50 ft with RW    Status  Achieved      PT SHORT TERM GOAL #3   Title -    Baseline -      PT SHORT TERM GOAL #4   Title -             PT Long Term Goals - 07/02/20 1354      PT LONG TERM GOAL #1   Title independent with updated HEP (Target all LTG 12 weeks  09/04/20)    Time 12    Period Weeks    Status On-going      PT LONG TERM GOAL #2   Title improve 5TSTS test to less than 25 seconds to show improved leg strength/endurance    Baseline 29 sec on 3/3    Time 12    Period Weeks    Status On-going      PT LONG TERM GOAL #3   Title Improve bilat leg strength to at least 4+/5 MMT measured in sitting to improve function    Baseline 4- to 4    Time 12    Period Weeks    Status On-going      PT LONG TERM GOAL #4   Title Will improve TUG to less than 30 sec to show improved balance and gait speed.    Baseline 46.9 with RW around cone    Time 12    Period Weeks    Status On-going      PT LONG TERM GOAL #5   Title Ambulate with LRAD mod I  for 300 ft or stand at least 10 min    Baseline 120 ft on 3/3 with RW    Time 12    Period Weeks    Status On-going                 Plan - 07/07/20 1357    Clinical Impression Statement He is overall progressing his activity level but continues to be limited by fatigue and leg weakness. PT will continue to work to improve this within sessions and with HEP.    Personal Factors and Comorbidities  Comorbidity 3+;Time since onset of injury/illness/exacerbation;Past/Current Experience;Fitness    Comorbidities PMH: repeated falls, pelvic fx, CVA X5, DM, CKD, CAD, CABGX4,mild dementia    Examination-Activity Limitations Bathing;Bed Mobility;Bend;Carry;Lift;Stand;Toileting;Stairs;Squat;Locomotion Level    Examination-Participation Restrictions Art gallery manager;Shop    Stability/Clinical Decision Making Evolving/Moderate complexity    Rehab Potential Fair    PT Frequency 2x / week    PT Duration 8 weeks    PT Treatment/Interventions Aquatic Therapy;Cryotherapy;Dentist;Therapeutic activities;Therapeutic exercise;Neuromuscular re-education;Manual techniques;Patient/family education;Passive range of motion;Taping    PT Next Visit Plan leg strength, endurance, balance, gait with RW/rollator    PT Home Exercise Plan [facility] [place]    Consulted and Agree with Plan of Care Patient           Patient will benefit from skilled therapeutic intervention in order to improve the following deficits and impairments:  Abnormal gait,Decreased activity tolerance,Decreased balance,Decreased endurance,Decreased coordination,Decreased range of motion,Decreased strength,Difficulty walking,Postural dysfunction,Pain,Improper body mechanics  Visit Diagnosis: Chronic bilateral low back pain with right-sided sciatica  Other abnormalities of gait and mobility  Muscle weakness (generalized)  Other symptoms and signs involving the nervous system  Chronic bilateral low back pain, unspecified whether sciatica present     Problem List Patient Active Problem List   Diagnosis Date Noted  . Situational depression 01/22/2020  . Urinary frequency 01/22/2020  . Bacteremia due to Staphylococcus epidermidis 11/24/2019  . Stroke (cerebrum) ([place])   .  Elevated troponin   . Closed right hip fracture ([place]) 11/15/2019  . Unilateral primary osteoarthritis, right  knee 11/13/2019  . At high risk for falls 05/29/2019  . Chronic bilateral low back pain without sciatica 02/07/2019  . Statin intolerance 11/16/2018  . Hyperlipidemia, mixed 08/15/2018  . Low back pain 07/18/2018  . History of lacunar cerebrovascular accident (CVA) 02/12/2018  . Dizziness 02/12/2018  . Myofascial pain 12/18/2017  . Spondylosis without myelopathy or radiculopathy, lumbar region 12/18/2017  . Lumbar radiculopathy, right 12/18/2017  . Cholelithiasis 05/17/2017  . Sinus Bradycardia 05/17/2017  . Thoracic radiculopathy 04/12/2017  . Primary osteoarthritis of right knee 12/14/2016  . DDD (degenerative disc disease), lumbar 10/10/2016  . Lumbar facet arthropathy 08/29/2016  . Idiopathic scoliosis 03/22/2016  . Diabetic neuropathy ([place]) 03/22/2016  . Dysautonomia orthostatic hypotension syndrome 02/25/2016  . CKD stage 3 due to type 2 diabetes mellitus ([place]) 02/19/2016  . Coronary artery disease involving coronary bypass graft of native heart without angina pectoris   . Diabetes mellitus type 2, diet-controlled ([place])   . 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 Medication management 08/11/2015  . 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 PVD (peripheral vascular disease) ([place]) 01/01/2014  . Hyperlipidemia associated with type 2 diabetes mellitus ([place]) 04/11/2013  . Supine hypertension   . Vitamin D deficiency   . Vitamin B 12 deficiency   . Coronary atherosclerosis- s/p PCI to LAD in 2009 and PCI to RCA in 2011 02/27/2009  . Abdominal aortic aneurysm ([place]) 02/27/2009  . GERD 02/27/2009    [name], PT,DPT 07/07/2020, 1:58 PM  [facility] Physical Therapy [address] [place], [place], [address] Phone: [phone]   Fax:  [phone]  Name: [name] MRN: [id number] Date of Birth: 08-02-1933

## 2020-07-08 ENCOUNTER — Other Ambulatory Visit: Payer: Self-pay | Admitting: Internal Medicine

## 2020-07-08 ENCOUNTER — Telehealth: Payer: Self-pay

## 2020-07-08 DIAGNOSIS — R0989 Other specified symptoms and signs involving the circulatory and respiratory systems: Secondary | ICD-10-CM

## 2020-07-08 NOTE — Telephone Encounter (Signed)
MED not on list anymore, not sure if it was D/C by provider.  KLOR-CON M20 TABLET

## 2020-07-09 ENCOUNTER — Other Ambulatory Visit: Payer: Self-pay

## 2020-07-09 ENCOUNTER — Ambulatory Visit: Payer: Medicare Other | Admitting: Physical Therapy

## 2020-07-09 DIAGNOSIS — M6281 Muscle weakness (generalized): Secondary | ICD-10-CM | POA: Diagnosis not present

## 2020-07-09 DIAGNOSIS — R29818 Other symptoms and signs involving the nervous system: Secondary | ICD-10-CM

## 2020-07-09 DIAGNOSIS — R2689 Other abnormalities of gait and mobility: Secondary | ICD-10-CM | POA: Diagnosis not present

## 2020-07-09 DIAGNOSIS — G8929 Other chronic pain: Secondary | ICD-10-CM

## 2020-07-09 DIAGNOSIS — M545 Low back pain, unspecified: Secondary | ICD-10-CM

## 2020-07-09 DIAGNOSIS — M5441 Lumbago with sciatica, right side: Secondary | ICD-10-CM

## 2020-07-09 NOTE — Therapy (Signed)
[facility] Therapy [address] [place], [place], [address] Phone: [phone]   Fax:  [phone]  Physical Therapy Treatment  Patient Details  Name: [name] MRN: [id number] Date of Birth: 17-Jun-1933 Referring Provider (PT): Royal Hawthorn, MD   Encounter Date: 07/09/2020   PT End of Session - 07/09/20 1350    Visit Number 7    Number of Visits 16    Date for PT Re-Evaluation 09/04/20    Authorization Type UHC MCR    PT Start Time 1307    PT Stop Time 1345    PT Time Calculation (min) 38 min    Equipment Utilized During Treatment Gait belt    Activity Tolerance Patient limited by fatigue    Behavior During Therapy [facility] for tasks assessed/performed           Past Medical History:  Diagnosis Date  . AAA (abdominal aortic aneurysm) ([place])   . 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 CAD (coronary artery disease)    a. s/p CABGx4 in 07/2015.  . Carotid artery disease ([place])   . Colon polyps   . Diabetes ([place])   . Difficult intubation   . 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 Esophageal reflux   . High cholesterol   . History of IBS 02/27/2009  . Hypertension    pt denies, he says he has a h/o hypotension. If BP up he adjusts the Flor[place]f  . Jejunitis with partial SBO 05/20/2017  . Orthostatic hypotension   . Prostatitis   . Stroke ([place])   . TIA (transient ischemic attack)   . Upper GI bleed 06/24/2018  . Vitamin B 12 deficiency   . Vitamin D deficiency     Past Surgical History:  Procedure Laterality Date  . BIOPSY  06/25/2018   Procedure: BIOPSY;  Surgeon: Rush Landmark Telford Nab., MD;  Location: [place];  Service: Gastroenterology;;  . BIOPSY  04/24/2019   Procedure: BIOPSY;  Surgeon: Irving Copas., MD;  Location: [facility];  Service: Gastroenterology;;  . BLADDER SURGERY  1969   traumatic pelvic fractures, urethral and bladder repair  . CARDIAC CATHETERIZATION N/A 07/01/2015   Procedure: Left Heart Cath and Coronary  Angiography;  Surgeon: Wellington Hampshire, MD;  Location: [place] [facility];  Service: Cardiovascular;  Laterality: N/A;  . COLON RESECTION N/A 05/17/2017   Procedure: DIAGNOSTIC LAPAROSCOPY,;  Surgeon: Leighton Ruff, MD;  [facility];  Service: General;  Laterality: N/A;  . COLONOSCOPY WITH PROPOFOL N/A 04/24/2019   Procedure: COLONOSCOPY WITH PROPOFOL;  Surgeon: Irving Copas., MD;  [facility];  Service: Gastroenterology;  Laterality: N/A;  . CORONARY ANGIOPLASTY WITH STENT PLACEMENT    . CORONARY ARTERY BYPASS GRAFT N/A 07/06/2015   Procedure: CORONARY ARTERY BYPASS GRAFTING (CABG)x 4   utilizing the left internal mammary artery and endoscopically harvested bilateral  sapheneous vein.;  Surgeon: Ivin Poot, MD;  Location: [place];  Service: Open Heart Surgery;  Laterality: N/A;  . ESOPHAGEAL DILATION  04/24/2019   Procedure: ESOPHAGEAL DILATION;  Surgeon: Rush Landmark Telford Nab., MD;  [facility];  Service: Gastroenterology;;  . ESOPHAGOGASTRODUODENOSCOPY (EGD) WITH PROPOFOL N/A 06/25/2018   Procedure: ESOPHAGOGASTRODUODENOSCOPY (EGD) WITH PROPOFOL;  Surgeon: Irving Copas., MD;  Location: [place];  Service: Gastroenterology;  Laterality: N/A;  . ESOPHAGOGASTRODUODENOSCOPY (EGD) WITH PROPOFOL N/A 04/24/2019   Procedure: ESOPHAGOGASTRODUODENOSCOPY (EGD) WITH PROPOFOL;  Surgeon: Rush Landmark Telford Nab., MD;  [facility];  Service: Gastroenterology;  Laterality: N/A;  . FEMUR  IM NAIL Right 11/16/2019   Procedure: RIGHT HIP INTRAMEDULLARY (IM) NAIL FEMORAL;  Surgeon: Meredith Pel, MD;  Location: [place];  Service: Orthopedics;  Laterality: Right;  . HEMOSTASIS CLIP PLACEMENT  04/24/2019   Procedure: HEMOSTASIS CLIP PLACEMENT;  Surgeon: Irving Copas., MD;  [facility];  Service: Gastroenterology;;  . KNEE SURGERY    . LOOP RECORDER INSERTION N/A 02/19/2018   Procedure: LOOP RECORDER INSERTION;  Surgeon: Deboraha Sprang, MD;  Location: [place] [facility];  Service: Cardiovascular;  Laterality: N/A;  . POLYPECTOMY  04/24/2019   Procedure: POLYPECTOMY;  Surgeon: Rush Landmark Telford Nab., MD;  Location[facility];  Service: Gastroenterology;;  . Lia Foyer LIFTING INJECTION  04/24/2019   Procedure: SUBMUCOSAL LIFTING INJECTION;  Surgeon: Irving Copas., MD;  [facility];  Service: Gastroenterology;;  . TEE WITHOUT CARDIOVERSION N/A 07/06/2015   Procedure: TRANSESOPHAGEAL ECHOCARDIOGRAM (TEE);  Surgeon: Ivin Poot, MD;  Location: [place];  Service: Open Heart Surgery;  Laterality: N/A;    There were no vitals filed for this visit.   Subjective Assessment - 07/09/20 1315    Subjective His wife reports he continues to be more active at home. Patient relays he feels overall ok, no more than his usual back/leg pain today    Pertinent History PMH: repeated falls, pelvic fx, CVA X5, DM, CKD, CAD, CABGX4,mild dementia    Limitations Lifting;Standing;Walking;House hold activities    How long can you stand comfortably? one minute    Diagnostic tests see chart for lumbar/pelvic MRI and XR    Patient Stated Goals get legs stronger and walk again    Pain Onset More than a month ago            [facility] Adult PT Treatment/Exercise - 07/09/20 0001      Ambulation/Gait   Ambulation/Gait Yes    Ambulation/Gait Assistance 4: Min guard    Ambulation Distance (Feet) 75 Feet   X2, then 25   Assistive device Rolling walker      Knee/Hip Exercises: Aerobic   Recumbent Bike L1 X 5 min, he states he prefers Nu step for next time      Knee/Hip Exercises: Machines for Strengthening   Total Gym Leg Press DL 56# 3X10, SL 25# 2 X10      Knee/Hip Exercises: Seated   Long Arc Quad Both;2 sets;10 reps    Long Arc Quad Weight 4 lbs.    Other Seated Knee/Hip Exercises seated rows and chest press with back off of chair  X20 with red    Marching Both;2 sets;10 reps    Marching Weights 4 lbs.    Sit to  Sand 5 reps;with UE support;2 sets   23 inch                   PT Short Term Goals - 06/30/20 1553      PT SHORT TERM GOAL #1   Title independent with initial HEP (Target for all STGs 07/10/20)    Time 4    Period Weeks    Status Achieved      PT SHORT TERM GOAL #2   Title Patient will ambulate with LRAD CGA at least 100 ft    Baseline 50 ft with RW    Status Achieved      PT SHORT TERM GOAL #3   Title -    Baseline -      PT SHORT TERM GOAL #4   Title -  PT Long Term Goals - 07/02/20 1354      PT LONG TERM GOAL #1   Title independent with updated HEP (Target all LTG 12 weeks  09/04/20)    Time 12    Period Weeks    Status On-going      PT LONG TERM GOAL #2   Title improve 5TSTS test to less than 25 seconds to show improved leg strength/endurance    Baseline 29 sec on 3/3    Time 12    Period Weeks    Status On-going      PT LONG TERM GOAL #3   Title Improve bilat leg strength to at least 4+/5 MMT measured in sitting to improve function    Baseline 4- to 4    Time 12    Period Weeks    Status On-going      PT LONG TERM GOAL #4   Title Will improve TUG to less than 30 sec to show improved balance and gait speed.    Baseline 46.9 with RW around cone    Time 12    Period Weeks    Status On-going      PT LONG TERM GOAL #5   Title Ambulate with LRAD mod I  for 300 ft or stand at least 10 min    Baseline 120 ft on 3/3 with RW    Time 12    Period Weeks    Status On-going                 Plan - 07/09/20 1351    Clinical Impression Statement He rode the bike first this session since nu step was occupied. Even though he did not ride as long his legs were more tired after and he was not able to perform as much leg strengthenining exercises as previous sesisons. He still showed good effort he was just more fatigued. PT will continue to work to improve his function as able.    Personal Factors and Comorbidities Comorbidity 3+;Time  since onset of injury/illness/exacerbation;Past/Current Experience;Fitness    Comorbidities PMH: repeated falls, pelvic fx, CVA X5, DM, CKD, CAD, CABGX4,mild dementia    Examination-Activity Limitations Bathing;Bed Mobility;Bend;Carry;Lift;Stand;Toileting;Stairs;Squat;Locomotion Level    Examination-Participation Restrictions Art gallery manager;Shop    Stability/Clinical Decision Making Evolving/Moderate complexity    Rehab Potential Fair    PT Frequency 2x / week    PT Duration 8 weeks    PT Treatment/Interventions Aquatic Therapy;Cryotherapy;Dentist;Therapeutic activities;Therapeutic exercise;Neuromuscular re-education;Manual techniques;Patient/family education;Passive range of motion;Taping    PT Next Visit Plan leg strength, endurance, balance, gait with RW/rollator    PT Home Exercise Plan [facility] [place]    Consulted and Agree with Plan of Care Patient           Patient will benefit from skilled therapeutic intervention in order to improve the following deficits and impairments:  Abnormal gait,Decreased activity tolerance,Decreased balance,Decreased endurance,Decreased coordination,Decreased range of motion,Decreased strength,Difficulty walking,Postural dysfunction,Pain,Improper body mechanics  Visit Diagnosis: Chronic bilateral low back pain with right-sided sciatica  Other abnormalities of gait and mobility  Muscle weakness (generalized)  Other symptoms and signs involving the nervous system  Chronic bilateral low back pain, unspecified whether sciatica present     Problem List Patient Active Problem List   Diagnosis Date Noted  . Situational depression 01/22/2020  . Urinary frequency 01/22/2020  . Bacteremia due to Staphylococcus epidermidis 11/24/2019  . Stroke (cerebrum) ([place])   . Elevated troponin   . Closed right hip fracture ([place]) 11/15/2019  .  Unilateral primary osteoarthritis, right knee 11/13/2019  .  At high risk for falls 05/29/2019  . Chronic bilateral low back pain without sciatica 02/07/2019  . Statin intolerance 11/16/2018  . Hyperlipidemia, mixed 08/15/2018  . Low back pain 07/18/2018  . History of lacunar cerebrovascular accident (CVA) 02/12/2018  . Dizziness 02/12/2018  . Myofascial pain 12/18/2017  . Spondylosis without myelopathy or radiculopathy, lumbar region 12/18/2017  . Lumbar radiculopathy, right 12/18/2017  . Cholelithiasis 05/17/2017  . Sinus Bradycardia 05/17/2017  . Thoracic radiculopathy 04/12/2017  . Primary osteoarthritis of right knee 12/14/2016  . DDD (degenerative disc disease), lumbar 10/10/2016  . Lumbar facet arthropathy 08/29/2016  . Idiopathic scoliosis 03/22/2016  . Diabetic neuropathy ([place]) 03/22/2016  . Dysautonomia orthostatic hypotension syndrome 02/25/2016  . CKD stage 3 due to type 2 diabetes mellitus ([place]) 02/19/2016  . Coronary artery disease involving coronary bypass graft of native heart without angina pectoris   . Diabetes mellitus type 2, diet-controlled ([place])   . 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 Medication management 08/11/2015  . 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 PVD (peripheral vascular disease) ([place]) 01/01/2014  . Hyperlipidemia associated with type 2 diabetes mellitus ([place]) 04/11/2013  . Supine hypertension   . Vitamin D deficiency   . Vitamin B 12 deficiency   . Coronary atherosclerosis- s/p PCI to LAD in 2009 and PCI to RCA in 2011 02/27/2009  . Abdominal aortic aneurysm ([place]) 02/27/2009  . GERD 02/27/2009    Silvestre Mesi 07/09/2020, 1:53 PM  [facility] Physical Therapy [address] [place], [place], [address] Phone: [phone]   Fax:  [phone]  Name: [name] MRN: [id number] Date of Birth: May 22, 1933

## 2020-07-13 ENCOUNTER — Other Ambulatory Visit: Payer: Self-pay | Admitting: Adult Health

## 2020-07-14 ENCOUNTER — Other Ambulatory Visit: Payer: Self-pay

## 2020-07-14 ENCOUNTER — Ambulatory Visit: Payer: Medicare Other | Admitting: Physical Therapy

## 2020-07-14 ENCOUNTER — Encounter: Payer: Self-pay | Admitting: Physical Therapy

## 2020-07-14 DIAGNOSIS — R2689 Other abnormalities of gait and mobility: Secondary | ICD-10-CM

## 2020-07-14 DIAGNOSIS — M545 Low back pain, unspecified: Secondary | ICD-10-CM

## 2020-07-14 DIAGNOSIS — R29818 Other symptoms and signs involving the nervous system: Secondary | ICD-10-CM | POA: Diagnosis not present

## 2020-07-14 DIAGNOSIS — M5441 Lumbago with sciatica, right side: Secondary | ICD-10-CM | POA: Diagnosis not present

## 2020-07-14 DIAGNOSIS — M6281 Muscle weakness (generalized): Secondary | ICD-10-CM | POA: Diagnosis not present

## 2020-07-14 DIAGNOSIS — G8929 Other chronic pain: Secondary | ICD-10-CM

## 2020-07-14 NOTE — Therapy (Signed)
[facility] Physical Therapy [address] [place], [place], [address] Phone: [phone]   Fax:  [phone]  Physical Therapy Treatment  Patient Details  Name: [name] MRN: [id number] Date of Birth: 1933-08-27 Referring Provider (PT): Royal Hawthorn, MD   Encounter Date: 07/14/2020   PT End of Session - 07/14/20 1346    Visit Number 8    Number of Visits 16    Date for PT Re-Evaluation 09/04/20    Authorization Type UHC MCR    PT Start Time 4097    PT Stop Time 1337    PT Time Calculation (min) 40 min    Equipment Utilized During Treatment Gait belt    Activity Tolerance Patient limited by fatigue    Behavior During Therapy [facility] for tasks assessed/performed           Past Medical History:  Diagnosis Date  . AAA (abdominal aortic aneurysm) ([place])   . Aneurysm of iliac artery (HCC)   . CAD (coronary artery disease)    a. s/p CABGx4 in 07/2015.  . Carotid artery disease ([place])   . Colon polyps   . Diabetes ([place])   . Difficult intubation   . 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 Esophageal reflux   . High cholesterol   . History of IBS 02/27/2009  . Hypertension    pt denies, he says he has a h/o hypotension. If BP up he adjusts the Florinef  . Jejunitis with partial SBO 05/20/2017  . Orthostatic hypotension   . Prostatitis   . Stroke ([place])   . TIA (transient ischemic attack)   . Upper GI bleed 06/24/2018  . Vitamin B 12 deficiency   . Vitamin D deficiency     Past Surgical History:  Procedure Laterality Date  . BIOPSY  06/25/2018   Procedure: BIOPSY;  Surgeon: Rush Landmark Telford Nab., MD;  Location: [facility];  Service: Gastroenterology;;  . BIOPSY  04/24/2019   Procedure: BIOPSY;  Surgeon: Irving Copas., MD;  Location: [facility];  Service: Gastroenterology;;  . BLADDER SURGERY  1969   traumatic pelvic fractures, urethral and bladder repair  . CARDIAC CATHETERIZATION N/A 07/01/2015   Procedure: Left Heart Cath and Coronary  Angiography;  Surgeon: Wellington Hampshire, MD;  Location: [place] [facility];  Service: Cardiovascular;  Laterality: N/A;  . COLON RESECTION N/A 05/17/2017   Procedure: DIAGNOSTIC LAPAROSCOPY,;  Surgeon: Leighton Ruff, MD;  [facility];  Service: General;  Laterality: N/A;  . COLONOSCOPY WITH PROPOFOL N/A 04/24/2019   Procedure: COLONOSCOPY WITH PROPOFOL;  Surgeon: Irving Copas., MD;  [facility];  Service: Gastroenterology;  Laterality: N/A;  . CORONARY ANGIOPLASTY WITH STENT PLACEMENT    . CORONARY ARTERY BYPASS GRAFT N/A 07/06/2015   Procedure: CORONARY ARTERY BYPASS GRAFTING (CABG)x 4   utilizing the left internal mammary artery and endoscopically harvested bilateral  sapheneous vein.;  Surgeon: Ivin Poot, MD;  Location: [facility];  Service: Open Heart Surgery;  Laterality: N/A;  . ESOPHAGEAL DILATION  04/24/2019   Procedure: ESOPHAGEAL DILATION;  Surgeon: Rush Landmark Telford Nab., MD;  [facility];  Service: Gastroenterology;;  . ESOPHAGOGASTRODUODENOSCOPY (EGD) WITH PROPOFOL N/A 06/25/2018   Procedure: ESOPHAGOGASTRODUODENOSCOPY (EGD) WITH PROPOFOL;  Surgeon: Irving Copas., MD;  Location: [place];  Service: Gastroenterology;  Laterality: N/A;  . ESOPHAGOGASTRODUODENOSCOPY (EGD) WITH PROPOFOL N/A 04/24/2019   Procedure: ESOPHAGOGASTRODUODENOSCOPY (EGD) WITH PROPOFOL;  Surgeon: Rush Landmark Telford Nab., MD;  [facility];  Service: Gastroenterology;  Laterality: N/A;  . FEMUR  IM NAIL Right 11/16/2019   Procedure: RIGHT HIP INTRAMEDULLARY (IM) NAIL FEMORAL;  Surgeon: Meredith Pel, MD;  Location: [facility];  Service: Orthopedics;  Laterality: Right;  . HEMOSTASIS CLIP PLACEMENT  04/24/2019   Procedure: HEMOSTASIS CLIP PLACEMENT;  Surgeon: Irving Copas., MD;  [facility];  Service: Gastroenterology;;  . KNEE SURGERY    . LOOP RECORDER INSERTION N/A 02/19/2018   Procedure: LOOP RECORDER INSERTION;  Surgeon: Deboraha Sprang, MD;  Location: [place] [facility];  Service: Cardiovascular;  Laterality: N/A;  . POLYPECTOMY  04/24/2019   Procedure: POLYPECTOMY;  Surgeon: Rush Landmark Telford Nab., MD;  Location: [facility];  Service: Gastroenterology;;  . Lia Foyer LIFTING INJECTION  04/24/2019   Procedure: SUBMUCOSAL LIFTING INJECTION;  Surgeon: Irving Copas., MD;  [facility];  Service: Gastroenterology;;  . TEE WITHOUT CARDIOVERSION N/A 07/06/2015   Procedure: TRANSESOPHAGEAL ECHOCARDIOGRAM (TEE);  Surgeon: Ivin Poot, MD;  Location: [place];  Service: Open Heart Surgery;  Laterality: N/A;    There were no vitals filed for this visit.   Subjective Assessment - 07/14/20 1325    Subjective he relays no new complaints or falls, just has the usual leg pain Rt is worse than Lt    Pertinent History PMH: repeated falls, pelvic fx, CVA X5, DM, CKD, CAD, CABGX4,mild dementia    Limitations Lifting;Standing;Walking;House hold activities    How long can you stand comfortably? one minute    Diagnostic tests see chart for lumbar/pelvic MRI and XR    Patient Stated Goals get legs stronger and walk again    Pain Onset More than a month ago             [facility] Adult PT Treatment/Exercise - 07/14/20 0001      Transfers   Transfers Sit to Stand    Sit to Stand 4: Min guard;With upper extremity assist   at times min A, needs RW     Ambulation/Gait   Ambulation/Gait Yes    Ambulation/Gait Assistance 4: Min guard    Ambulation/Gait Assistance Details did not need cues for step though pattern but still has decreased step length on Lt due to decreased  weight bearing acceptance on Rt    Ambulation Distance (Feet) 100 Feet    Assistive device Rolling walker      Knee/Hip Exercises: Aerobic   Nustep L5  X10 min   rest breaks PRN     Knee/Hip Exercises: Machines for Strengthening   Total Gym Leg Press DL 56# 2X15, SL 31# 2 X10      Knee/Hip Exercises: Seated   Long Arc Quad Both;3 sets;10  reps    Long Arc Quad Weight 4 lbs.    Marching Both;2 sets;10 reps    Marching Weights 4 lbs.    Sit to Sand 5 reps;with UE support;2 sets   standard chair with pad                   PT Short Term Goals - 06/30/20 1553      PT SHORT TERM GOAL #1   Title independent with initial HEP (Target for all STGs 07/10/20)    Time 4    Period Weeks    Status Achieved      PT SHORT TERM GOAL #2   Title Patient will ambulate with LRAD CGA at least 100 ft    Baseline 50 ft with RW    Status Achieved      PT SHORT TERM  GOAL #3   Title -    Baseline -      PT SHORT TERM GOAL #4   Title -             PT Long Term Goals - 07/02/20 1354      PT LONG TERM GOAL #1   Title independent with updated HEP (Target all LTG 12 weeks  09/04/20)    Time 12    Period Weeks    Status On-going      PT LONG TERM GOAL #2   Title improve 5TSTS test to less than 25 seconds to show improved leg strength/endurance    Baseline 29 sec on 3/3    Time 12    Period Weeks    Status On-going      PT LONG TERM GOAL #3   Title Improve bilat leg strength to at least 4+/5 MMT measured in sitting to improve function    Baseline 4- to 4    Time 12    Period Weeks    Status On-going      PT LONG TERM GOAL #4   Title Will improve TUG to less than 30 sec to show improved balance and gait speed.    Baseline 46.9 with RW around cone    Time 12    Period Weeks    Status On-going      PT LONG TERM GOAL #5   Title Ambulate with LRAD mod I  for 300 ft or stand at least 10 min    Baseline 120 ft on 3/3 with RW    Time 12    Period Weeks    Status On-going                 Plan - 07/14/20 1346    Clinical Impression Statement Continued to work on gait, leg strength and endurance. His Rt leg continues to be more limited than his left. He showes good effort with PT but needs frequent rest breaks. PT will continue to progress as able.    Personal Factors and Comorbidities Comorbidity 3+;Time  since onset of injury/illness/exacerbation;Past/Current Experience;Fitness    Comorbidities PMH: repeated falls, pelvic fx, CVA X5, DM, CKD, CAD, CABGX4,mild dementia    Examination-Activity Limitations Bathing;Bed Mobility;Bend;Carry;Lift;Stand;Toileting;Stairs;Squat;Locomotion Level    Examination-Participation Restrictions Art gallery manager;Shop    Stability/Clinical Decision Making Evolving/Moderate complexity    Rehab Potential Fair    PT Frequency 2x / week    PT Duration 8 weeks    PT Treatment/Interventions Aquatic Therapy;Cryotherapy;Dentist;Therapeutic activities;Therapeutic exercise;Neuromuscular re-education;Manual techniques;Patient/family education;Passive range of motion;Taping    PT Next Visit Plan leg strength, endurance, balance, gait with RW    PT Home Exercise Plan [facility]    Consulted and Agree with Plan of Care Patient           Patient will benefit from skilled therapeutic intervention in order to improve the following deficits and impairments:  Abnormal gait,Decreased activity tolerance,Decreased balance,Decreased endurance,Decreased coordination,Decreased range of motion,Decreased strength,Difficulty walking,Postural dysfunction,Pain,Improper body mechanics  Visit Diagnosis: Chronic bilateral low back pain with right-sided sciatica  Other abnormalities of gait and mobility  Muscle weakness (generalized)  Other symptoms and signs involving the nervous system  Chronic bilateral low back pain, unspecified whether sciatica present     Problem List Patient Active Problem List   Diagnosis Date Noted  . Situational depression 01/22/2020  . Urinary frequency 01/22/2020  . Bacteremia due to Staphylococcus epidermidis 11/24/2019  . Stroke (cerebrum) ([place])   .  Elevated troponin   . Closed right hip fracture ([place]) 11/15/2019  . Unilateral primary osteoarthritis, right knee 11/13/2019  . At high  risk for falls 05/29/2019  . Chronic bilateral low back pain without sciatica 02/07/2019  . Statin intolerance 11/16/2018  . Hyperlipidemia, mixed 08/15/2018  . Low back pain 07/18/2018  . History of lacunar cerebrovascular accident (CVA) 02/12/2018  . Dizziness 02/12/2018  . Myofascial pain 12/18/2017  . Spondylosis without myelopathy or radiculopathy, lumbar region 12/18/2017  . Lumbar radiculopathy, right 12/18/2017  . Cholelithiasis 05/17/2017  . Sinus Bradycardia 05/17/2017  . Thoracic radiculopathy 04/12/2017  . Primary osteoarthritis of right knee 12/14/2016  . DDD (degenerative disc disease), lumbar 10/10/2016  . Lumbar facet arthropathy 08/29/2016  . Idiopathic scoliosis 03/22/2016  . Diabetic neuropathy ([place]) 03/22/2016  . Dysautonomia orthostatic hypotension syndrome 02/25/2016  . CKD stage 3 due to type 2 diabetes mellitus ([place]) 02/19/2016  . Coronary artery disease involving coronary bypass graft of native heart without angina pectoris   . Diabetes mellitus type 2, diet-controlled ([place])   . 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 Medication management 08/11/2015  . 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 PVD (peripheral vascular disease) ([place]) 01/01/2014  . Hyperlipidemia associated with type 2 diabetes mellitus ([place]) 04/11/2013  . Supine hypertension   . Vitamin D deficiency   . Vitamin B 12 deficiency   . Coronary atherosclerosis- s/p PCI to LAD in 2009 and PCI to RCA in 2011 02/27/2009  . Abdominal aortic aneurysm ([place]) 02/27/2009  . GERD 02/27/2009    Silvestre Mesi 07/14/2020, 1:48 PM  [facility] Physical Therapy [address] [place], [place], [address] Phone: [phone]   Fax:  [phone]  Name: [name] MRN: [id number] Date of Birth: 1934/01/05

## 2020-07-16 ENCOUNTER — Ambulatory Visit: Payer: Medicare Other | Admitting: Physical Therapy

## 2020-07-16 ENCOUNTER — Other Ambulatory Visit: Payer: Self-pay

## 2020-07-16 DIAGNOSIS — R29818 Other symptoms and signs involving the nervous system: Secondary | ICD-10-CM | POA: Diagnosis not present

## 2020-07-16 DIAGNOSIS — M5441 Lumbago with sciatica, right side: Secondary | ICD-10-CM

## 2020-07-16 DIAGNOSIS — R2689 Other abnormalities of gait and mobility: Secondary | ICD-10-CM

## 2020-07-16 DIAGNOSIS — M545 Low back pain, unspecified: Secondary | ICD-10-CM

## 2020-07-16 DIAGNOSIS — G8929 Other chronic pain: Secondary | ICD-10-CM

## 2020-07-16 DIAGNOSIS — M6281 Muscle weakness (generalized): Secondary | ICD-10-CM | POA: Diagnosis not present

## 2020-07-16 NOTE — Therapy (Signed)
[facility] Physical Therapy [address] [place], [place], [address] Phone: [phone]   Fax:  [phone]  Physical Therapy Treatment  Patient Details  Name: [name] MRN: [id number] Date of Birth: 07-27-1933 Referring Provider (PT): Royal Hawthorn, MD   Encounter Date: 07/16/2020   PT End of Session - 07/16/20 1338    Visit Number 9    Number of Visits 16    Date for PT Re-Evaluation 09/04/20    Authorization Type UHC MCR    Progress Note Due on Visit 10    PT Start Time 1300    PT Stop Time 1340    PT Time Calculation (min) 40 min    Equipment Utilized During Treatment Gait belt    Activity Tolerance Patient limited by fatigue    Behavior During Therapy [facility] for tasks assessed/performed           Past Medical History:  Diagnosis Date  . AAA (abdominal aortic aneurysm) ([place])   . Aneurysm of iliac artery (HCC)   . CAD (coronary artery disease)    a. s/p CABGx4 in 07/2015.  . Carotid artery disease ([place])   . Colon polyps   . Diabetes ([place])   . Difficult intubation   . 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 Esophageal reflux   . High cholesterol   . History of IBS 02/27/2009  . Hypertension    pt denies, he says he has a h/o hypotension. If BP up he adjusts the [place]f  . Jejunitis with partial SBO 05/20/2017  . Orthostatic hypotension   . Prostatitis   . Stroke ([place])   . TIA (transient ischemic attack)   . Upper GI bleed 06/24/2018  . Vitamin B 12 deficiency   . Vitamin D deficiency     Past Surgical History:  Procedure Laterality Date  . BIOPSY  06/25/2018   Procedure: BIOPSY;  Surgeon: Rush Landmark Telford Nab., MD;  Location: [facility];  Service: Gastroenterology;;  . BIOPSY  04/24/2019   Procedure: BIOPSY;  Surgeon: Irving Copas., MD;  Location: [facility];  Service: Gastroenterology;;  . BLADDER SURGERY  1969   traumatic pelvic fractures, urethral and bladder repair  . CARDIAC CATHETERIZATION N/A 07/01/2015    Procedure: Left Heart Cath and Coronary Angiography;  Surgeon: Wellington Hampshire, MD;  Location: [place] [facility];  Service: Cardiovascular;  Laterality: N/A;  . COLON RESECTION N/A 05/17/2017   Procedure: DIAGNOSTIC LAPAROSCOPY,;  Surgeon: Leighton Ruff, MD;  [facility];  Service: General;  Laterality: N/A;  . COLONOSCOPY WITH PROPOFOL N/A 04/24/2019   Procedure: COLONOSCOPY WITH PROPOFOL;  Surgeon: Irving Copas., MD;  [facility];  Service: Gastroenterology;  Laterality: N/A;  . CORONARY ANGIOPLASTY WITH STENT PLACEMENT    . CORONARY ARTERY BYPASS GRAFT N/A 07/06/2015   Procedure: CORONARY ARTERY BYPASS GRAFTING (CABG)x 4   utilizing the left internal mammary artery and endoscopically harvested bilateral  sapheneous vein.;  Surgeon: Ivin Poot, MD;  Location: [facility];  Service: Open Heart Surgery;  Laterality: N/A;  . ESOPHAGEAL DILATION  04/24/2019   Procedure: ESOPHAGEAL DILATION;  Surgeon: Rush Landmark Telford Nab., MD;  [facility];  Service: Gastroenterology;;  . ESOPHAGOGASTRODUODENOSCOPY (EGD) WITH PROPOFOL N/A 06/25/2018   Procedure: ESOPHAGOGASTRODUODENOSCOPY (EGD) WITH PROPOFOL;  Surgeon: Irving Copas., MD;  Location: [place];  Service: Gastroenterology;  Laterality: N/A;  . ESOPHAGOGASTRODUODENOSCOPY (EGD) WITH PROPOFOL N/A 04/24/2019   Procedure: ESOPHAGOGASTRODUODENOSCOPY (EGD) WITH PROPOFOL;  Surgeon: Rush Landmark Telford Nab., MD;  [facility];  Service: Gastroenterology;  Laterality: N/A;  . FEMUR IM NAIL Right 11/16/2019   Procedure: RIGHT HIP INTRAMEDULLARY (IM) NAIL FEMORAL;  Surgeon: Meredith Pel, MD;  Location: [place];  Service: Orthopedics;  Laterality: Right;  . HEMOSTASIS CLIP PLACEMENT  04/24/2019   Procedure: HEMOSTASIS CLIP PLACEMENT;  Surgeon: Irving Copas., MD;  [facility];  Service: Gastroenterology;;  . KNEE SURGERY    . LOOP RECORDER INSERTION N/A 02/19/2018   Procedure: LOOP  RECORDER INSERTION;  Surgeon: Deboraha Sprang, MD;  Location: [facility] [facility];  Service: Cardiovascular;  Laterality: N/A;  . POLYPECTOMY  04/24/2019   Procedure: POLYPECTOMY;  Surgeon: Rush Landmark Telford Nab., MD;  Location[facility];  Service: Gastroenterology;;  . Lia Foyer LIFTING INJECTION  04/24/2019   Procedure: SUBMUCOSAL LIFTING INJECTION;  Surgeon: Irving Copas., MD;  [facility];  Service: Gastroenterology;;  . TEE WITHOUT CARDIOVERSION N/A 07/06/2015   Procedure: TRANSESOPHAGEAL ECHOCARDIOGRAM (TEE);  Surgeon: Ivin Poot, MD;  Location: [place];  Service: Open Heart Surgery;  Laterality: N/A;    There were no vitals filed for this visit.   Subjective Assessment - 07/16/20 1309    Subjective he relays just the usual pain in his legs and back, not worse or better overall    Pertinent History PMH: repeated falls, pelvic fx, CVA X5, DM, CKD, CAD, CABGX4,mild dementia    Limitations Lifting;Standing;Walking;House hold activities    How long can you stand comfortably? one minute    Diagnostic tests see chart for lumbar/pelvic MRI and XR    Patient Stated Goals get legs stronger and walk again    Pain Onset More than a month ago             [facility] Adult PT Treatment/Exercise - 07/16/20 0001      Transfers   Transfers Sit to Stand    Sit to Stand 4: Min guard;With upper extremity assist      Ambulation/Gait   Ambulation/Gait Yes    Ambulation/Gait Assistance 4: Min guard    Ambulation Distance (Feet) 150 Feet   but walked first   Assistive device Rolling walker      Neuro Re-ed    Neuro Re-ed Details  modified tandem balance in RW 10 sec X 3 bilat, balance with feet together and head turns      Knee/Hip Exercises: Aerobic   Nustep L5  X8 min (at dnd)      Knee/Hip Exercises: Machines for Strengthening   Total Gym Leg Press --      Knee/Hip Exercises: Standing   Hip Flexion Both;15 reps;Knee bent    Hip Flexion Limitations 4lbs with UE  support    Hip Abduction Both;15 reps    Abduction Limitations with UE support, 4 lbs      Knee/Hip Exercises: Seated   Long Arc Quad Both;3 sets;10 reps    Long Arc Quad Weight 4 lbs.    Hamstring Curl Both;20 reps    Hamstring Limitations green    Sit to Sand 5 reps;with UE support;2 sets                    PT Short Term Goals - 06/30/20 1553      PT SHORT TERM GOAL #1   Title independent with initial HEP (Target for all STGs 07/10/20)    Time 4    Period Weeks    Status Achieved      PT SHORT TERM GOAL #2   Title  Patient will ambulate with LRAD CGA at least 100 ft    Baseline 50 ft with RW    Status Achieved      PT SHORT TERM GOAL #3   Title -    Baseline -      PT SHORT TERM GOAL #4   Title -             PT Long Term Goals - 07/02/20 1354      PT LONG TERM GOAL #1   Title independent with updated HEP (Target all LTG 12 weeks  09/04/20)    Time 12    Period Weeks    Status On-going      PT LONG TERM GOAL #2   Title improve 5TSTS test to less than 25 seconds to show improved leg strength/endurance    Baseline 29 sec on 3/3    Time 12    Period Weeks    Status On-going      PT LONG TERM GOAL #3   Title Improve bilat leg strength to at least 4+/5 MMT measured in sitting to improve function    Baseline 4- to 4    Time 12    Period Weeks    Status On-going      PT LONG TERM GOAL #4   Title Will improve TUG to less than 30 sec to show improved balance and gait speed.    Baseline 46.9 with RW around cone    Time 12    Period Weeks    Status On-going      PT LONG TERM GOAL #5   Title Ambulate with LRAD mod I  for 300 ft or stand at least 10 min    Baseline 120 ft on 3/3 with RW    Time 12    Period Weeks    Status On-going                 Plan - 07/16/20 1339    Clinical Impression Statement We had him walk first today instead of ridding Nu step and he showed big improvement in walking endurance likely due to him not being  fatigued after the Nu step so we will continue to implement this strategy and have him ride nu step at end of session for his endurance. He will need progress note next visit    Personal Factors and Comorbidities Comorbidity 3+;Time since onset of injury/illness/exacerbation;Past/Current Experience;Fitness    Comorbidities PMH: repeated falls, pelvic fx, CVA X5, DM, CKD, CAD, CABGX4,mild dementia    Examination-Activity Limitations Bathing;Bed Mobility;Bend;Carry;Lift;Stand;Toileting;Stairs;Squat;Locomotion Level    Examination-Participation Restrictions Art gallery manager;Shop    Stability/Clinical Decision Making Evolving/Moderate complexity    Rehab Potential Fair    PT Frequency 2x / week    PT Duration 8 weeks    PT Treatment/Interventions Aquatic Therapy;Cryotherapy;Dentist;Therapeutic activities;Therapeutic exercise;Neuromuscular re-education;Manual techniques;Patient/family education;Passive range of motion;Taping    PT Next Visit Plan 10th visit progress note, leg strength, endurance, balance, gait with RW    PT Home Exercise Plan [facility]    Consulted and Agree with Plan of Care Patient           Patient will benefit from skilled therapeutic intervention in order to improve the following deficits and impairments:  Abnormal gait,Decreased activity tolerance,Decreased balance,Decreased endurance,Decreased coordination,Decreased range of motion,Decreased strength,Difficulty walking,Postural dysfunction,Pain,Improper body mechanics  Visit Diagnosis: Chronic bilateral low back pain with right-sided sciatica  Other abnormalities of gait and mobility  Muscle weakness (generalized)  Other symptoms  and signs involving the nervous system  Chronic bilateral low back pain, unspecified whether sciatica present     Problem List Patient Active Problem List   Diagnosis Date Noted  . Situational depression 01/22/2020  .  Urinary frequency 01/22/2020  . Bacteremia due to Staphylococcus epidermidis 11/24/2019  . Stroke (cerebrum) ([place])   . Elevated troponin   . Closed right hip fracture ([place]) 11/15/2019  . Unilateral primary osteoarthritis, right knee 11/13/2019  . At high risk for falls 05/29/2019  . Chronic bilateral low back pain without sciatica 02/07/2019  . Statin intolerance 11/16/2018  . Hyperlipidemia, mixed 08/15/2018  . Low back pain 07/18/2018  . History of lacunar cerebrovascular accident (CVA) 02/12/2018  . Dizziness 02/12/2018  . Myofascial pain 12/18/2017  . Spondylosis without myelopathy or radiculopathy, lumbar region 12/18/2017  . Lumbar radiculopathy, right 12/18/2017  . Cholelithiasis 05/17/2017  . Sinus Bradycardia 05/17/2017  . Thoracic radiculopathy 04/12/2017  . Primary osteoarthritis of right knee 12/14/2016  . DDD (degenerative disc disease), lumbar 10/10/2016  . Lumbar facet arthropathy 08/29/2016  . Idiopathic scoliosis 03/22/2016  . Diabetic neuropathy ([place]) 03/22/2016  . Dysautonomia orthostatic hypotension syndrome 02/25/2016  . CKD stage 3 due to type 2 diabetes mellitus ([place]) 02/19/2016  . Coronary artery disease involving coronary bypass graft of native heart without angina pectoris   . Diabetes mellitus type 2, diet-controlled ([place])   . 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 Medication management 08/11/2015  . 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 PVD (peripheral vascular disease) ([place]) 01/01/2014  . Hyperlipidemia associated with type 2 diabetes mellitus ([place]) 04/11/2013  . Supine hypertension   . Vitamin D deficiency   . Vitamin B 12 deficiency   . Coronary atherosclerosis- s/p PCI to LAD in 2009 and PCI to RCA in 2011 02/27/2009  . Abdominal aortic aneurysm ([place]) 02/27/2009  . GERD 02/27/2009    [name], PT,DPT 07/16/2020, 1:46 PM  [facility] Physical Therapy [address] [place], [place], [address] Phone: [phone]   Fax:   [phone]  Name: BOYSIE BONEBRAKE MRN: [id number] Date of Birth: 1933-12-08

## 2020-07-21 ENCOUNTER — Ambulatory Visit: Payer: Medicare Other | Admitting: Physical Therapy

## 2020-07-21 ENCOUNTER — Encounter: Payer: Self-pay | Admitting: Physical Therapy

## 2020-07-21 ENCOUNTER — Other Ambulatory Visit: Payer: Self-pay

## 2020-07-21 DIAGNOSIS — M6281 Muscle weakness (generalized): Secondary | ICD-10-CM

## 2020-07-21 DIAGNOSIS — R2689 Other abnormalities of gait and mobility: Secondary | ICD-10-CM

## 2020-07-21 DIAGNOSIS — R29818 Other symptoms and signs involving the nervous system: Secondary | ICD-10-CM

## 2020-07-21 DIAGNOSIS — M5441 Lumbago with sciatica, right side: Secondary | ICD-10-CM | POA: Diagnosis not present

## 2020-07-21 DIAGNOSIS — G8929 Other chronic pain: Secondary | ICD-10-CM

## 2020-07-21 NOTE — Therapy (Signed)
[facility] Physical Therapy [address] [place], [place], [address] Phone: [phone]   Fax:  [phone]  Physical Therapy Treatment & 10th Visit Progress Note  Patient Details  Name: [name] MRN: [id number] Date of Birth: 1934-02-21 Referring Provider (PT): Royal Hawthorn, MD   Encounter Date: 07/21/2020   Progress Note Reporting Period 06/12/2020 to 07/21/2020  See note below for Objective Data and Assessment of Progress/Goals.        PT End of Session - 07/21/20 1339    Visit Number 10    Number of Visits 16    Date for PT Re-Evaluation 09/04/20    Authorization Type UHC MCR    Progress Note Due on Visit 20    PT Start Time 1340    PT Stop Time 1425    PT Time Calculation (min) 45 min    Equipment Utilized During Treatment Gait belt    Activity Tolerance Patient limited by fatigue    Behavior During Therapy WFL for tasks assessed/performed           Past Medical History:  Diagnosis Date  . AAA (abdominal aortic aneurysm) ([place])   . Aneurysm of iliac artery (H[facility])   . CAD (coronary artery disease)    a. s/p CABGx4 in 07/2015.  . Carotid artery disease ([place])   . Colon polyps   . Diabetes ([place])   . Difficult intubation   . 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 Esophageal reflux   . High cholesterol   . History of IBS 02/27/2009  . Hypertension    pt denies, he says he has a h/o hypotension. If BP up he adjusts the Florinef  . Jejunitis with partial SBO 05/20/2017  . Orthostatic hypotension   . Prostatitis   . Stroke ([place])   . TIA (transient ischemic attack)   . Upper GI bleed 06/24/2018  . Vitamin B 12 deficiency   . Vitamin D deficiency     Past Surgical History:  Procedure Laterality Date  . BIOPSY  06/25/2018   Procedure: BIOPSY;  Surgeon: Rush Landmark Telford Nab., MD;  Location: [place];  Service: Gastroenterology;;  . BIOPSY  04/24/2019   Procedure: BIOPSY;  Surgeon: Irving Copas., MD;  Location[facility];   Service: Gastroenterology;;  . BLADDER SURGERY  1969   traumatic pelvic fractures, urethral and bladder repair  . CARDIAC CATHETERIZATION N/A 07/01/2015   Procedure: Left Heart Cath and Coronary Angiography;  Surgeon: Wellington Hampshire, MD;  Location: [place] [facility];  Service: Cardiovascular;  Laterality: N/A;  . COLON RESECTION N/A 05/17/2017   Procedure: DIAGNOSTIC LAPAROSCOPY,;  Surgeon: Leighton Ruff, MD;  [facility];  Service: General;  Laterality: N/A;  . COLONOSCOPY WITH PROPOFOL N/A 04/24/2019   Procedure: COLONOSCOPY WITH PROPOFOL;  Surgeon: Irving Copas., MD;  [facility];  Service: Gastroenterology;  Laterality: N/A;  . CORONARY ANGIOPLASTY WITH STENT PLACEMENT    . CORONARY ARTERY BYPASS GRAFT N/A 07/06/2015   Procedure: CORONARY ARTERY BYPASS GRAFTING (CABG)x 4   utilizing the left internal mammary artery and endoscopically harvested bilateral  sapheneous vein.;  Surgeon: Ivin Poot, MD;  Location: [place];  Service: Open Heart Surgery;  Laterality: N/A;  . ESOPHAGEAL DILATION  04/24/2019   Procedure: ESOPHAGEAL DILATION;  Surgeon: Rush Landmark Telford Nab., MD;  [facility];  Service: Gastroenterology;;  . ESOPHAGOGASTRODUODENOSCOPY (EGD) WITH PROPOFOL N/A 06/25/2018   Procedure: ESOPHAGOGASTRODUODENOSCOPY (EGD) WITH PROPOFOL;  Surgeon: Irving Copas., MD;  Location: [place];  Service: Gastroenterology;  Laterality: N/A;  . ESOPHAGOGASTRODUODENOSCOPY (EGD) WITH PROPOFOL N/A 04/24/2019   Procedure: ESOPHAGOGASTRODUODENOSCOPY (EGD) WITH PROPOFOL;  Surgeon: Rush Landmark Telford Nab., MD;  [facility];  Service: Gastroenterology;  Laterality: N/A;  . FEMUR IM NAIL Right 11/16/2019   Procedure: RIGHT HIP INTRAMEDULLARY (IM) NAIL FEMORAL;  Surgeon: Meredith Pel, MD;  Location: [place];  Service: Orthopedics;  Laterality: Right;  . HEMOSTASIS CLIP PLACEMENT  04/24/2019   Procedure: HEMOSTASIS CLIP PLACEMENT;  Surgeon:  Irving Copas., MD;  [facility];  Service: Gastroenterology;;  . KNEE SURGERY    . LOOP RECORDER INSERTION N/A 02/19/2018   Procedure: LOOP RECORDER INSERTION;  Surgeon: Deboraha Sprang, MD;  Location: [place] [facility];  Service: Cardiovascular;  Laterality: N/A;  . POLYPECTOMY  04/24/2019   Procedure: POLYPECTOMY;  Surgeon: Rush Landmark Telford Nab., MD;  Location: [facility];  Service: Gastroenterology;;  . Lia Foyer LIFTING INJECTION  04/24/2019   Procedure: SUBMUCOSAL LIFTING INJECTION;  Surgeon: Irving Copas., MD;  [facility];  Service: Gastroenterology;;  . TEE WITHOUT CARDIOVERSION N/A 07/06/2015   Procedure: TRANSESOPHAGEAL ECHOCARDIOGRAM (TEE);  Surgeon: Ivin Poot, MD;  Location: [facility];  Service: Open Heart Surgery;  Laterality: N/A;    There were no vitals filed for this visit.   Subjective Assessment - 07/21/20 1340    Subjective No changes since last PT session.    Pertinent History PMH: repeated falls, pelvic fx, CVA X5, DM, CKD, CAD, CABGX4,mild dementia    Limitations Lifting;Standing;Walking;House hold activities    How long can you stand comfortably? one minute    Diagnostic tests see chart for lumbar/pelvic MRI and XR    Patient Stated Goals get legs stronger and walk again    Currently in Pain? Yes    Pain Score 5     Pain Location Knee   knee & quad muscle   Pain Orientation Right    Pain Descriptors / Indicators Aching;Sore    Pain Type Chronic pain    Pain Onset More than a month ago    Pain Frequency Intermittent    Aggravating Factors  sitting    Pain Relieving Factors unknown                             [facility] Adult PT Treatment/Exercise - 07/21/20 1340      Transfers   Transfers Sit to Stand    Sit to Stand 4: Min guard;With upper extremity assist      Ambulation/Gait   Ambulation/Gait Yes    Ambulation/Gait Assistance 4: Min guard    Ambulation/Gait Assistance Details Pt reports  right quad pain & weakness limiting distance. He walked first again.    Ambulation Distance (Feet) 64 Feet   47' begin session, 72' & 45' end of session   Assistive device Rolling walker      Neuro Re-ed    Neuro Re-ed Details  modified tandem balance in RW 10 sec X 3 bilat, modified tandem without touching RW with minA, balance with feet together and head turns with minA without UE support.      Knee/Hip Exercises: Aerobic   Nustep sesat 13 Level 5  X8 min (at end of session). verbal cues on full range      Knee/Hip Exercises: Standing   Hip Flexion Both;15 reps;Knee bent    Hip Flexion Limitations 4lbs with UE support    Hip Abduction Both;15 reps  Abduction Limitations with UE support, 4 lbs      Knee/Hip Exercises: Seated   Long Arc Quad Both;3 sets;10 reps    Long Arc Quad Weight 4 lbs.    Hamstring Curl Both;20 reps    Hamstring Limitations green    Sit to Sand 5 reps;with UE support;2 sets   requires armrests to push up & RW to stabilize upon arising                   PT Short Term Goals - 06/30/20 1553      PT SHORT TERM GOAL #1   Title independent with initial HEP (Target for all STGs 07/10/20)    Time 4    Period Weeks    Status Achieved      PT SHORT TERM GOAL #2   Title Patient will ambulate with LRAD CGA at least 100 ft    Baseline 50 ft with RW    Status Achieved      PT SHORT TERM GOAL #3   Title -    Baseline -      PT SHORT TERM GOAL #4   Title -             PT Long Term Goals - 07/02/20 1354      PT LONG TERM GOAL #1   Title independent with updated HEP (Target all LTG 12 weeks  09/04/20)    Time 12    Period Weeks    Status On-going      PT LONG TERM GOAL #2   Title improve 5TSTS test to less than 25 seconds to show improved leg strength/endurance    Baseline 29 sec on 3/3    Time 12    Period Weeks    Status On-going      PT LONG TERM GOAL #3   Title Improve bilat leg strength to at least 4+/5 MMT measured in sitting to  improve function    Baseline 4- to 4    Time 12    Period Weeks    Status On-going      PT LONG TERM GOAL #4   Title Will improve TUG to less than 30 sec to show improved balance and gait speed.    Baseline 46.9 with RW around cone    Time 12    Period Weeks    Status On-going      PT LONG TERM GOAL #5   Title Ambulate with LRAD mod I  for 300 ft or stand at least 10 min    Baseline 120 ft on 3/3 with RW    Time 12    Period Weeks    Status On-going                 Plan - 07/21/20 1339    Clinical Impression Statement Patient is making slow steady progress. PT is working on strength, balance, endurance & gait. He fatigues quickly with activities requiring rests.    Personal Factors and Comorbidities Comorbidity 3+;Time since onset of injury/illness/exacerbation;Past/Current Experience;Fitness    Comorbidities PMH: repeated falls, pelvic fx, CVA X5, DM, CKD, CAD, CABGX4,mild dementia    Examination-Activity Limitations Bathing;Bed Mobility;Bend;Carry;Lift;Stand;Toileting;Stairs;Squat;Locomotion Level    Examination-Participation Restrictions Art gallery manager;Shop    Stability/Clinical Decision Making Evolving/Moderate complexity    Rehab Potential Fair    PT Frequency 2x / week    PT Duration 8 weeks    PT Treatment/Interventions Aquatic Therapy;Cryotherapy;Dentist;Therapeutic activities;Therapeutic exercise;Neuromuscular re-education;Manual techniques;Patient/family  education;Passive range of motion;Taping    PT Next Visit Plan leg strength, endurance, balance, gait with RW    PT Home Exercise Plan [facility]    Consulted and Agree with Plan of Care Patient           Patient will benefit from skilled therapeutic intervention in order to improve the following deficits and impairments:  Abnormal gait,Decreased activity tolerance,Decreased balance,Decreased endurance,Decreased coordination,Decreased range of  motion,Decreased strength,Difficulty walking,Postural dysfunction,Pain,Improper body mechanics  Visit Diagnosis: Chronic bilateral low back pain with right-sided sciatica  Other abnormalities of gait and mobility  Muscle weakness (generalized)  Other symptoms and signs involving the nervous system     Problem List Patient Active Problem List   Diagnosis Date Noted  . Situational depression 01/22/2020  . Urinary frequency 01/22/2020  . Bacteremia due to Staphylococcus epidermidis 11/24/2019  . Stroke (cerebrum) ([place])   . Elevated troponin   . Closed right hip fracture ([place]) 11/15/2019  . Unilateral primary osteoarthritis, right knee 11/13/2019  . At high risk for falls 05/29/2019  . Chronic bilateral low back pain without sciatica 02/07/2019  . Statin intolerance 11/16/2018  . Hyperlipidemia, mixed 08/15/2018  . Low back pain 07/18/2018  . History of lacunar cerebrovascular accident (CVA) 02/12/2018  . Dizziness 02/12/2018  . Myofascial pain 12/18/2017  . Spondylosis without myelopathy or radiculopathy, lumbar region 12/18/2017  . Lumbar radiculopathy, right 12/18/2017  . Cholelithiasis 05/17/2017  . Sinus Bradycardia 05/17/2017  . Thoracic radiculopathy 04/12/2017  . Primary osteoarthritis of right knee 12/14/2016  . DDD (degenerative disc disease), lumbar 10/10/2016  . Lumbar facet arthropathy 08/29/2016  . Idiopathic scoliosis 03/22/2016  . Diabetic neuropathy ([place]) 03/22/2016  . Dysautonomia orthostatic hypotension syndrome 02/25/2016  . CKD stage 3 due to type 2 diabetes mellitus ([place]) 02/19/2016  . Coronary artery disease involving coronary bypass graft of native heart without angina pectoris   . Diabetes mellitus type 2, diet-controlled ([place])   . 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 Medication management 08/11/2015  . 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 PVD (peripheral vascular disease) ([place]) 01/01/2014  . Hyperlipidemia associated with type 2 diabetes mellitus ([place])  04/11/2013  . Supine hypertension   . Vitamin D deficiency   . Vitamin B 12 deficiency   . Coronary atherosclerosis- s/p PCI to LAD in 2009 and PCI to RCA in 2011 02/27/2009  . Abdominal aortic aneurysm ([place]) 02/27/2009  . GERD 02/27/2009    Jamey Reas, PT, DPT 07/21/2020, 3:30 PM  [facility] Physical Therapy [address] [place], [place], [address] Phone: [phone]   Fax:  [phone]  Name: [name] MRN: [id number] Date of Birth: Sep 14, 1933

## 2020-07-23 ENCOUNTER — Encounter: Payer: Self-pay | Admitting: Physical Therapy

## 2020-07-23 ENCOUNTER — Ambulatory Visit: Payer: Medicare Other | Admitting: Physical Therapy

## 2020-07-23 ENCOUNTER — Other Ambulatory Visit: Payer: Self-pay

## 2020-07-23 ENCOUNTER — Ambulatory Visit (INDEPENDENT_AMBULATORY_CARE_PROVIDER_SITE_OTHER): Payer: Medicare Other

## 2020-07-23 DIAGNOSIS — R29818 Other symptoms and signs involving the nervous system: Secondary | ICD-10-CM | POA: Diagnosis not present

## 2020-07-23 DIAGNOSIS — I6310 Cerebral infarction due to embolism of unspecified precerebral artery: Secondary | ICD-10-CM

## 2020-07-23 DIAGNOSIS — M6281 Muscle weakness (generalized): Secondary | ICD-10-CM | POA: Diagnosis not present

## 2020-07-23 DIAGNOSIS — R2689 Other abnormalities of gait and mobility: Secondary | ICD-10-CM | POA: Diagnosis not present

## 2020-07-23 DIAGNOSIS — M5441 Lumbago with sciatica, right side: Secondary | ICD-10-CM | POA: Diagnosis not present

## 2020-07-23 DIAGNOSIS — G8929 Other chronic pain: Secondary | ICD-10-CM

## 2020-07-23 LAB — CUP PACEART REMOTE DEVICE CHECK
Date Time Interrogation Session: 20220324035713
Date Time Interrogation Session: 20220324105004
Implantable Pulse Generator Implant Date: 20191021
Implantable Pulse Generator Implant Date: 20191021

## 2020-07-23 NOTE — Therapy (Signed)
[facility] Physical Therapy [address] [place], [place], [address] Phone: [phone]   Fax:  [phone]  Physical Therapy Treatment  Patient Details  Name: [name] MRN: [id number] Date of Birth: 30-Jul-1933 Referring Provider (PT): Royal Hawthorn, MD   Encounter Date: 07/23/2020   PT End of Session - 07/23/20 1304    Visit Number 11    Number of Visits 16    Date for PT Re-Evaluation 09/04/20    Authorization Type UHC MCR    Progress Note Due on Visit 20    PT Start Time 1300    PT Stop Time 1347    PT Time Calculation (min) 47 min    Equipment Utilized During Treatment Gait belt    Activity Tolerance Patient limited by fatigue    Behavior During Therapy [facility] for tasks assessed/performed           Past Medical History:  Diagnosis Date  . AAA (abdominal aortic aneurysm) ([place])   . Aneurysm of iliac artery ([facility])   . CAD (coronary artery disease)    a. s/p CABGx4 in 07/2015.  . Carotid artery disease ([place])   . Colon polyps   . Diabetes ([place])   . Difficult intubation   . 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 Esophageal reflux   . High cholesterol   . History of IBS 02/27/2009  . Hypertension    pt denies, he says he has a h/o hypotension. If BP up he adjusts the Florinef  . Jejunitis with partial SBO 05/20/2017  . Orthostatic hypotension   . Prostatitis   . Stroke ([place])   . TIA (transient ischemic attack)   . Upper GI bleed 06/24/2018  . Vitamin B 12 deficiency   . Vitamin D deficiency     Past Surgical History:  Procedure Laterality Date  . BIOPSY  06/25/2018   Procedure: BIOPSY;  Surgeon: Rush Landmark Telford Nab., MD;  Location: [place];  Service: Gastroenterology;;  . BIOPSY  04/24/2019   Procedure: BIOPSY;  Surgeon: Irving Copas., MD;  Location: [facility];  Service: Gastroenterology;;  . BLADDER SURGERY  1969   traumatic pelvic fractures, urethral and bladder repair  . CARDIAC CATHETERIZATION N/A 07/01/2015    Procedure: Left Heart Cath and Coronary Angiography;  Surgeon: Wellington Hampshire, MD;  Location: [facility] [facility];  Service: Cardiovascular;  Laterality: N/A;  . COLON RESECTION N/A 05/17/2017   Procedure: DIAGNOSTIC LAPAROSCOPY,;  Surgeon: Leighton Ruff, MD;  [facility];  Service: General;  Laterality: N/A;  . COLONOSCOPY WITH PROPOFOL N/A 04/24/2019   Procedure: COLONOSCOPY WITH PROPOFOL;  Surgeon: Irving Copas., MD;  [facility];  Service: Gastroenterology;  Laterality: N/A;  . CORONARY ANGIOPLASTY WITH STENT PLACEMENT    . CORONARY ARTERY BYPASS GRAFT N/A 07/06/2015   Procedure: CORONARY ARTERY BYPASS GRAFTING (CABG)x 4   utilizing the left internal mammary artery and endoscopically harvested bilateral  sapheneous vein.;  Surgeon: Ivin Poot, MD;  Location: [facility];  Service: Open Heart Surgery;  Laterality: N/A;  . ESOPHAGEAL DILATION  04/24/2019   Procedure: ESOPHAGEAL DILATION;  Surgeon: Rush Landmark Telford Nab., MD;  [facility];  Service: Gastroenterology;;  . ESOPHAGOGASTRODUODENOSCOPY (EGD) WITH PROPOFOL N/A 06/25/2018   Procedure: ESOPHAGOGASTRODUODENOSCOPY (EGD) WITH PROPOFOL;  Surgeon: Irving Copas., MD;  Location: [place];  Service: Gastroenterology;  Laterality: N/A;  . ESOPHAGOGASTRODUODENOSCOPY (EGD) WITH PROPOFOL N/A 04/24/2019   Procedure: ESOPHAGOGASTRODUODENOSCOPY (EGD) WITH PROPOFOL;  Surgeon: Rush Landmark Telford Nab., MD;  [facility];  Service: Gastroenterology;  Laterality: N/A;  . FEMUR IM NAIL Right 11/16/2019   Procedure: RIGHT HIP INTRAMEDULLARY (IM) NAIL FEMORAL;  Surgeon: Meredith Pel, MD;  Location: [place];  Service: Orthopedics;  Laterality: Right;  . HEMOSTASIS CLIP PLACEMENT  04/24/2019   Procedure: HEMOSTASIS CLIP PLACEMENT;  Surgeon: Irving Copas., MD;  [facility];  Service: Gastroenterology;;  . KNEE SURGERY    . LOOP RECORDER INSERTION N/A 02/19/2018   Procedure: LOOP  RECORDER INSERTION;  Surgeon: Deboraha Sprang, MD;  Location: [place] [facility];  Service: Cardiovascular;  Laterality: N/A;  . POLYPECTOMY  04/24/2019   Procedure: POLYPECTOMY;  Surgeon: Rush Landmark Telford Nab., MD;  Location[facility];  Service: Gastroenterology;;  . Lia Foyer LIFTING INJECTION  04/24/2019   Procedure: SUBMUCOSAL LIFTING INJECTION;  Surgeon: Irving Copas., MD;  [facility];  Service: Gastroenterology;;  . TEE WITHOUT CARDIOVERSION N/A 07/06/2015   Procedure: TRANSESOPHAGEAL ECHOCARDIOGRAM (TEE);  Surgeon: Ivin Poot, MD;  Location: [place];  Service: Open Heart Surgery;  Laterality: N/A;    There were no vitals filed for this visit.   Subjective Assessment - 07/23/20 1300    Subjective He reports that his right hip is hurting again. No falls or other activities that aggravated it.    Pertinent History PMH: repeated falls, pelvic fx, CVA X5, DM, CKD, CAD, CABGX4,mild dementia    Limitations Lifting;Standing;Walking;House hold activities    How long can you stand comfortably? one minute    Diagnostic tests see chart for lumbar/pelvic MRI and XR    Patient Stated Goals get legs stronger and walk again    Currently in Pain? Yes    Pain Score 5     Pain Location Hip    Pain Orientation Right;Posterior    Pain Descriptors / Indicators Aching    Pain Onset More than a month ago    Pain Frequency Intermittent    Aggravating Factors  sitting in chair    Pain Relieving Factors just put up with it.                             [facility] Adult PT Treatment/Exercise - 07/23/20 1300      Transfers   Transfers Sit to Stand    Sit to Stand 4: Min guard;With upper extremity assist      Ambulation/Gait   Ambulation/Gait Yes    Ambulation/Gait Assistance 4: Min guard    Ambulation Distance (Feet) 95 Feet   95' max tolerable at beginning session, 20' & 45' end of session   Assistive device Rolling walker      Neuro Re-ed    Neuro  Re-ed Details  tandem balance in RW 10 sec X 3 bilat, step forward over line releasing RW 5 sec 3 reps ea LE with minA, balance with feet together and head turns with minA without UE support. backwards walking with RW 5' with minA.      Knee/Hip Exercises: Aerobic   Nustep seat 13 Level 5  X8 min (at end of session). verbal cues on full range      Knee/Hip Exercises: Standing   Hip Flexion Both;15 reps;Knee bent    Hip Flexion Limitations 4lbs with UE support    Hip Abduction Both;15 reps    Abduction Limitations with UE support, 4 lbs      Knee/Hip Exercises: Seated   Long Arc Quad Both;3 sets;10 reps    Long Arc  Quad Weight 4 lbs.    Hamstring Curl Both;20 reps;2 sets;10 reps   RLE 2 sets 10 reps & LLE 1 set 20 reps   Hamstring Limitations green    Sit to Sand 5 reps;with UE support;2 sets   requires armrests to push up & RW to stabilize upon arising                   PT Short Term Goals - 06/30/20 1553      PT SHORT TERM GOAL #1   Title independent with initial HEP (Target for all STGs 07/10/20)    Time 4    Period Weeks    Status Achieved      PT SHORT TERM GOAL #2   Title Patient will ambulate with LRAD CGA at least 100 ft    Baseline 50 ft with RW    Status Achieved      PT SHORT TERM GOAL #3   Title -    Baseline -      PT SHORT TERM GOAL #4   Title -             PT Long Term Goals - 07/02/20 1354      PT LONG TERM GOAL #1   Title independent with updated HEP (Target all LTG 12 weeks  09/04/20)    Time 12    Period Weeks    Status On-going      PT LONG TERM GOAL #2   Title improve 5TSTS test to less than 25 seconds to show improved leg strength/endurance    Baseline 29 sec on 3/3    Time 12    Period Weeks    Status On-going      PT LONG TERM GOAL #3   Title Improve bilat leg strength to at least 4+/5 MMT measured in sitting to improve function    Baseline 4- to 4    Time 12    Period Weeks    Status On-going      PT LONG TERM GOAL #4    Title Will improve TUG to less than 30 sec to show improved balance and gait speed.    Baseline 46.9 with RW around cone    Time 12    Period Weeks    Status On-going      PT LONG TERM GOAL #5   Title Ambulate with LRAD mod I  for 300 ft or stand at least 10 min    Baseline 120 ft on 3/3 with RW    Time 12    Period Weeks    Status On-going                 Plan - 07/23/20 1304    Clinical Impression Statement PT fatigues quickly requiring frequent rests between exercises & activities.  His max tolerable gait distance before requiring a seated rest was 95' today.    Personal Factors and Comorbidities Comorbidity 3+;Time since onset of injury/illness/exacerbation;Past/Current Experience;Fitness    Comorbidities PMH: repeated falls, pelvic fx, CVA X5, DM, CKD, CAD, CABGX4,mild dementia    Examination-Activity Limitations Bathing;Bed Mobility;Bend;Carry;Lift;Stand;Toileting;Stairs;Squat;Locomotion Level    Examination-Participation Restrictions Art gallery manager;Shop    Stability/Clinical Decision Making Evolving/Moderate complexity    Rehab Potential Fair    PT Frequency 2x / week    PT Duration 8 weeks    PT Treatment/Interventions Aquatic Therapy;Cryotherapy;Dentist;Therapeutic activities;Therapeutic exercise;Neuromuscular re-education;Manual techniques;Patient/family education;Passive range of motion;Taping    PT Next Visit Plan set  updated STGs with target date 4/7,  leg strength, endurance, balance, gait with RW    PT Home Exercise Plan [facility]    Consulted and Agree with Plan of Care Patient           Patient will benefit from skilled therapeutic intervention in order to improve the following deficits and impairments:  Abnormal gait,Decreased activity tolerance,Decreased balance,Decreased endurance,Decreased coordination,Decreased range of motion,Decreased strength,Difficulty walking,Postural  dysfunction,Pain,Improper body mechanics  Visit Diagnosis: Chronic bilateral low back pain with right-sided sciatica  Other abnormalities of gait and mobility  Muscle weakness (generalized)  Other symptoms and signs involving the nervous system     Problem List Patient Active Problem List   Diagnosis Date Noted  . Situational depression 01/22/2020  . Urinary frequency 01/22/2020  . Bacteremia due to Staphylococcus epidermidis 11/24/2019  . Stroke (cerebrum) ([place])   . Elevated troponin   . Closed right hip fracture ([place]) 11/15/2019  . Unilateral primary osteoarthritis, right knee 11/13/2019  . At high risk for falls 05/29/2019  . Chronic bilateral low back pain without sciatica 02/07/2019  . Statin intolerance 11/16/2018  . Hyperlipidemia, mixed 08/15/2018  . Low back pain 07/18/2018  . History of lacunar cerebrovascular accident (CVA) 02/12/2018  . Dizziness 02/12/2018  . Myofascial pain 12/18/2017  . Spondylosis without myelopathy or radiculopathy, lumbar region 12/18/2017  . Lumbar radiculopathy, right 12/18/2017  . Cholelithiasis 05/17/2017  . Sinus Bradycardia 05/17/2017  . Thoracic radiculopathy 04/12/2017  . Primary osteoarthritis of right knee 12/14/2016  . DDD (degenerative disc disease), lumbar 10/10/2016  . Lumbar facet arthropathy 08/29/2016  . Idiopathic scoliosis 03/22/2016  . Diabetic neuropathy ([place]) 03/22/2016  . Dysautonomia orthostatic hypotension syndrome 02/25/2016  . CKD stage 3 due to type 2 diabetes mellitus ([place]) 02/19/2016  . Coronary artery disease involving coronary bypass graft of native heart without angina pectoris   . Diabetes mellitus type 2, diet-controlled ([place])   . 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 Medication management 08/11/2015  . 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 PVD (peripheral vascular disease) ([place]) 01/01/2014  . Hyperlipidemia associated with type 2 diabetes mellitus ([place]) 04/11/2013  . Supine hypertension   . Vitamin D  deficiency   . Vitamin B 12 deficiency   . Coronary atherosclerosis- s/p PCI to LAD in 2009 and PCI to RCA in 2011 02/27/2009  . Abdominal aortic aneurysm ([place]) 02/27/2009  . GERD 02/27/2009    Jamey Reas, PT, DPT 07/23/2020, 1:43 PM  [facility] Physical Therapy [address] [place], [place], [address] Phone: [phone]   Fax:  [phone]  Name: ELADIO DENTREMONT MRN: [id number] Date of Birth: 04-Feb-1934

## 2020-07-28 ENCOUNTER — Ambulatory Visit: Payer: Medicare Other | Admitting: Physical Therapy

## 2020-07-28 ENCOUNTER — Other Ambulatory Visit: Payer: Self-pay

## 2020-07-28 DIAGNOSIS — M5441 Lumbago with sciatica, right side: Secondary | ICD-10-CM | POA: Diagnosis not present

## 2020-07-28 DIAGNOSIS — M6281 Muscle weakness (generalized): Secondary | ICD-10-CM

## 2020-07-28 DIAGNOSIS — G8929 Other chronic pain: Secondary | ICD-10-CM

## 2020-07-28 DIAGNOSIS — R2689 Other abnormalities of gait and mobility: Secondary | ICD-10-CM | POA: Diagnosis not present

## 2020-07-28 DIAGNOSIS — R29818 Other symptoms and signs involving the nervous system: Secondary | ICD-10-CM | POA: Diagnosis not present

## 2020-07-28 NOTE — Therapy (Signed)
[facility] [address] [place], [place], [address] Phone: [phone]   Fax:  [phone]  Physical Therapy Treatment  Patient Details  Name: [name] MRN: [id number] Date of Birth: 21-Jun-1933 Referring Provider (PT): Royal Hawthorn, MD   Encounter Date: 07/28/2020   PT End of Session - 07/28/20 1338    Visit Number 12    Number of Visits 16    Date for PT Re-Evaluation 09/04/20    Authorization Type UHC MCR    Progress Note Due on Visit 20    PT Start Time 1300    PT Stop Time 1344    PT Time Calculation (min) 44 min    Equipment Utilized During Treatment Gait belt    Activity Tolerance Patient limited by fatigue    Behavior During Therapy [facility] for tasks assessed/performed           Past Medical History:  Diagnosis Date  . AAA (abdominal aortic aneurysm) ([place])   . Aneurysm of iliac artery (HCC)   . CAD (coronary artery disease)    a. s/p CABGx4 in 07/2015.  . Carotid artery disease ([place])   . Colon polyps   . Diabetes ([place])   . Difficult intubation   . 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 Esophageal reflux   . High cholesterol   . History of IBS 02/27/2009  . Hypertension    pt denies, he says he has a h/o hypotension. If BP up he adjusts the Florinef  . Jejunitis with partial SBO 05/20/2017  . Orthostatic hypotension   . Prostatitis   . Stroke ([place])   . TIA (transient ischemic attack)   . Upper GI bleed 06/24/2018  . Vitamin B 12 deficiency   . Vitamin D deficiency     Past Surgical History:  Procedure Laterality Date  . BIOPSY  06/25/2018   Procedure: BIOPSY;  Surgeon: Rush Landmark Telford Nab., MD;  Location: [place];  Service: Gastroenterology;;  . BIOPSY  04/24/2019   Procedure: BIOPSY;  Surgeon: Irving Copas., MD;  Location: [facility];  Service: Gastroenterology;;  . BLADDER SURGERY  1969   traumatic pelvic fractures, urethral and bladder repair  . CARDIAC CATHETERIZATION N/A 07/01/2015    Procedure: Left Heart Cath and Coronary Angiography;  Surgeon: Wellington Hampshire, MD;  Location: [place] [facility];  Service: Cardiovascular;  Laterality: N/A;  . COLON RESECTION N/A 05/17/2017   Procedure: DIAGNOSTIC LAPAROSCOPY,;  Surgeon: Leighton Ruff, MD;  [facility]: [facility];  Service: General;  Laterality: N/A;  . COLONOSCOPY WITH PROPOFOL N/A 04/24/2019   Procedure: COLONOSCOPY WITH PROPOFOL;  Surgeon: Irving Copas., MD;  [facility];  Service: Gastroenterology;  Laterality: N/A;  . CORONARY ANGIOPLASTY WITH STENT PLACEMENT    . CORONARY ARTERY BYPASS GRAFT N/A 07/06/2015   Procedure: CORONARY ARTERY BYPASS GRAFTING (CABG)x 4   utilizing the left internal mammary artery and endoscopically harvested bilateral  sapheneous vein.;  Surgeon: Ivin Poot, MD;  Location: [place];  Service: Open Heart Surgery;  Laterality: N/A;  . ESOPHAGEAL DILATION  04/24/2019   Procedure: ESOPHAGEAL DILATION;  Surgeon: Rush Landmark Telford Nab., MD;  [facility];  Service: Gastroenterology;;  . ESOPHAGOGASTRODUODENOSCOPY (EGD) WITH PROPOFOL N/A 06/25/2018   Procedure: ESOPHAGOGASTRODUODENOSCOPY (EGD) WITH PROPOFOL;  Surgeon: Irving Copas., MD;  Location: [place];  Service: Gastroenterology;  Laterality: N/A;  . ESOPHAGOGASTRODUODENOSCOPY (EGD) WITH PROPOFOL N/A 04/24/2019   Procedure: ESOPHAGOGASTRODUODENOSCOPY (EGD) WITH PROPOFOL;  Surgeon: Rush Landmark Telford Nab., MD;  [facility];  Service: Gastroenterology;  Laterality: N/A;  . FEMUR IM NAIL Right 11/16/2019   Procedure: RIGHT HIP INTRAMEDULLARY (IM) NAIL FEMORAL;  Surgeon: Meredith Pel, MD;  Location: [place];  Service: Orthopedics;  Laterality: Right;  . HEMOSTASIS CLIP PLACEMENT  04/24/2019   Procedure: HEMOSTASIS CLIP PLACEMENT;  Surgeon: Irving Copas., MD;  [facility];  Service: Gastroenterology;;  . KNEE SURGERY    . LOOP RECORDER INSERTION N/A 02/19/2018   Procedure: LOOP  RECORDER INSERTION;  Surgeon: Deboraha Sprang, MD;  Location: [place] [facility];  Service: Cardiovascular;  Laterality: N/A;  . POLYPECTOMY  04/24/2019   Procedure: POLYPECTOMY;  Surgeon: Rush Landmark Telford Nab., MD;  Location: [facility];  Service: Gastroenterology;;  . Lia Foyer LIFTING INJECTION  04/24/2019   Procedure: SUBMUCOSAL LIFTING INJECTION;  Surgeon: Irving Copas., MD;  [facility];  Service: Gastroenterology;;  . TEE WITHOUT CARDIOVERSION N/A 07/06/2015   Procedure: TRANSESOPHAGEAL ECHOCARDIOGRAM (TEE);  Surgeon: Ivin Poot, MD;  Location: [place];  Service: Open Heart Surgery;  Laterality: N/A;    There were no vitals filed for this visit.   Subjective Assessment - 07/28/20 1310    Subjective He reports 8/10 continued pain in his Rt hip    Pertinent History PMH: repeated falls, pelvic fx, CVA X5, DM, CKD, CAD, CABGX4,mild dementia    Limitations Lifting;Standing;Walking;House hold activities    How long can you stand comfortably? one minute    Diagnostic tests see chart for lumbar/pelvic MRI and XR    Patient Stated Goals get legs stronger and walk again    Pain Onset More than a month ago           [facility] Adult PT Treatment/Exercise - 07/28/20 0001      Transfers   Transfers Sit to Stand    Sit to Stand 4: Min guard;With upper extremity assist      Ambulation/Gait   Ambulation/Gait Yes    Ambulation/Gait Assistance 4: Min guard    Ambulation Distance (Feet) 135 Feet    Assistive device Rolling walker      Neuro Re-ed    Neuro Re-ed Details  at sink balance with feet apart and head nods X 10, then feet together 30 sec, then mod tandem 15 sec X 2 bilat      Knee/Hip Exercises: Aerobic   Nustep seat 13 Level 5  X8 min (at end of session).      Knee/Hip Exercises: Standing   Hip Flexion Both;15 reps;Knee bent    Hip Flexion Limitations 4lbs with UE support    Hip Abduction Both;15 reps    Abduction Limitations with UE support, 4 lbs     Other Standing Knee Exercises mini squats at sink with UE support and chair behind him 2 sets of 10      Knee/Hip Exercises: Seated   Long Arc Quad Both;3 sets;10 reps    Long Arc Quad Weight 4 lbs.                    PT Short Term Goals - 06/30/20 1553      PT SHORT TERM GOAL #1   Title independent with initial HEP (Target for all STGs 07/10/20)    Time 4    Period Weeks    Status Achieved      PT SHORT TERM GOAL #2   Title Patient will ambulate with LRAD CGA at least 100 ft    Baseline 50 ft with RW  Status Achieved      PT SHORT TERM GOAL #3   Title -    Baseline -      PT SHORT TERM GOAL #4   Title -             PT Long Term Goals - 07/02/20 1354      PT LONG TERM GOAL #1   Title independent with updated HEP (Target all LTG 12 weeks  09/04/20)    Time 12    Period Weeks    Status On-going      PT LONG TERM GOAL #2   Title improve 5TSTS test to less than 25 seconds to show improved leg strength/endurance    Baseline 29 sec on 3/3    Time 12    Period Weeks    Status On-going      PT LONG TERM GOAL #3   Title Improve bilat leg strength to at least 4+/5 MMT measured in sitting to improve function    Baseline 4- to 4    Time 12    Period Weeks    Status On-going      PT LONG TERM GOAL #4   Title Will improve TUG to less than 30 sec to show improved balance and gait speed.    Baseline 46.9 with RW around cone    Time 12    Period Weeks    Status On-going      PT LONG TERM GOAL #5   Title Ambulate with LRAD mod I  for 300 ft or stand at least 10 min    Baseline 120 ft on 3/3 with RW    Time 12    Period Weeks    Status On-going                 Plan - 07/28/20 1340    Clinical Impression Statement Had some improvement with max tolerable gait today from last week but this was still not what is was 2 weeks ago as he is limited by fatigue and Rt hip pain. PT will attempt to continue to progress as able.    Personal Factors and  Comorbidities Comorbidity 3+;Time since onset of injury/illness/exacerbation;Past/Current Experience;Fitness    Comorbidities PMH: repeated falls, pelvic fx, CVA X5, DM, CKD, CAD, CABGX4,mild dementia    Examination-Activity Limitations Bathing;Bed Mobility;Bend;Carry;Lift;Stand;Toileting;Stairs;Squat;Locomotion Level    Examination-Participation Restrictions Art gallery manager;Shop    Stability/Clinical Decision Making Evolving/Moderate complexity    Rehab Potential Fair    PT Frequency 2x / week    PT Duration 8 weeks    PT Treatment/Interventions Aquatic Therapy;Cryotherapy;Dentist;Therapeutic activities;Therapeutic exercise;Neuromuscular re-education;Manual techniques;Patient/family education;Passive range of motion;Taping    PT Next Visit Plan set updated STGs with target date 4/7,  leg strength, endurance, balance, gait with RW    PT Home Exercise Plan [facility]    Consulted and Agree with Plan of Care Patient           Patient will benefit from skilled therapeutic intervention in order to improve the following deficits and impairments:  Abnormal gait,Decreased activity tolerance,Decreased balance,Decreased endurance,Decreased coordination,Decreased range of motion,Decreased strength,Difficulty walking,Postural dysfunction,Pain,Improper body mechanics  Visit Diagnosis: Chronic bilateral low back pain with right-sided sciatica  Other abnormalities of gait and mobility  Muscle weakness (generalized)  Other symptoms and signs involving the nervous system     Problem List Patient Active Problem List   Diagnosis Date Noted  . Situational depression 01/22/2020  . Urinary frequency 01/22/2020  . Bacteremia  due to Staphylococcus epidermidis 11/24/2019  . Stroke (cerebrum) ([place])   . Elevated troponin   . Closed right hip fracture ([place]) 11/15/2019  . Unilateral primary osteoarthritis, right knee 11/13/2019  . At high  risk for falls 05/29/2019  . Chronic bilateral low back pain without sciatica 02/07/2019  . Statin intolerance 11/16/2018  . Hyperlipidemia, mixed 08/15/2018  . Low back pain 07/18/2018  . History of lacunar cerebrovascular accident (CVA) 02/12/2018  . Dizziness 02/12/2018  . Myofascial pain 12/18/2017  . Spondylosis without myelopathy or radiculopathy, lumbar region 12/18/2017  . Lumbar radiculopathy, right 12/18/2017  . Cholelithiasis 05/17/2017  . Sinus Bradycardia 05/17/2017  . Thoracic radiculopathy 04/12/2017  . Primary osteoarthritis of right knee 12/14/2016  . DDD (degenerative disc disease), lumbar 10/10/2016  . Lumbar facet arthropathy 08/29/2016  . Idiopathic scoliosis 03/22/2016  . Diabetic neuropathy ([place]) 03/22/2016  . Dysautonomia orthostatic hypotension syndrome 02/25/2016  . CKD stage 3 due to type 2 diabetes mellitus ([place]) 02/19/2016  . Coronary artery disease involving coronary bypass graft of native heart without angina pectoris   . Diabetes mellitus type 2, diet-controlled ([place])   . 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 Medication management 08/11/2015  . 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 PVD (peripheral vascular disease) ([place]) 01/01/2014  . Hyperlipidemia associated with type 2 diabetes mellitus ([place]) 04/11/2013  . Supine hypertension   . Vitamin D deficiency   . Vitamin B 12 deficiency   . Coronary atherosclerosis- s/p PCI to LAD in 2009 and PCI to RCA in 2011 02/27/2009  . Abdominal aortic aneurysm ([place]) 02/27/2009  . GERD 02/27/2009    Silvestre Mesi 07/28/2020, 1:48 PM  [facility] [address] [place], [place], [address] Phone: [phone]   Fax:  [phone]  Name: [name] MRN: [id number] Date of Birth: 21-Dec-1933

## 2020-07-30 ENCOUNTER — Ambulatory Visit: Payer: Medicare Other | Admitting: Physical Therapy

## 2020-07-30 ENCOUNTER — Other Ambulatory Visit: Payer: Self-pay

## 2020-07-30 ENCOUNTER — Encounter: Payer: Self-pay | Admitting: Physical Therapy

## 2020-07-30 DIAGNOSIS — M6281 Muscle weakness (generalized): Secondary | ICD-10-CM

## 2020-07-30 DIAGNOSIS — R2689 Other abnormalities of gait and mobility: Secondary | ICD-10-CM

## 2020-07-30 DIAGNOSIS — G8929 Other chronic pain: Secondary | ICD-10-CM

## 2020-07-30 DIAGNOSIS — M5441 Lumbago with sciatica, right side: Secondary | ICD-10-CM | POA: Diagnosis not present

## 2020-07-30 DIAGNOSIS — R29818 Other symptoms and signs involving the nervous system: Secondary | ICD-10-CM | POA: Diagnosis not present

## 2020-07-30 NOTE — Therapy (Signed)
[facility] [address] [place], [place], [address] Phone: [phone]   Fax:  [phone]  Physical Therapy Treatment  Patient Details  Name: [name] MRN: [id number] Date of Birth: 01-19-1934 Referring Provider (PT): Royal Hawthorn, MD   Encounter Date: 07/30/2020   PT End of Session - 07/30/20 1331    Visit Number 13    Number of Visits 16    Date for PT Re-Evaluation 09/04/20    Authorization Type UHC MCR    Progress Note Due on Visit 20    PT Start Time 1300    PT Stop Time 1344    PT Time Calculation (min) 44 min    Equipment Utilized During Treatment Gait belt    Activity Tolerance Patient limited by fatigue    Behavior During Therapy [facility] for tasks assessed/performed           Past Medical History:  Diagnosis Date  . AAA (abdominal aortic aneurysm) ([place])   . Aneurysm of iliac artery (HCC)   . CAD (coronary artery disease)    a. s/p CABGx4 in 07/2015.  . Carotid artery disease ([place])   . Colon polyps   . Diabetes ([place])   . Difficult intubation   . 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 Esophageal reflux   . High cholesterol   . History of IBS 02/27/2009  . Hypertension    pt denies, he says he has a h/o hypotension. If BP up he adjusts the Florinef  . Jejunitis with partial SBO 05/20/2017  . Orthostatic hypotension   . Prostatitis   . Stroke ([place])   . TIA (transient ischemic attack)   . Upper GI bleed 06/24/2018  . Vitamin B 12 deficiency   . Vitamin D deficiency     Past Surgical History:  Procedure Laterality Date  . BIOPSY  06/25/2018   Procedure: BIOPSY;  Surgeon: Rush Landmark Telford Nab., MD;  Location: [place];  Service: Gastroenterology;;  . BIOPSY  04/24/2019   Procedure: BIOPSY;  Surgeon: Irving Copas., MD;  Location: [facility];  Service: Gastroenterology;;  . BLADDER SURGERY  1969   traumatic pelvic fractures, urethral and bladder repair  . CARDIAC CATHETERIZATION N/A 07/01/2015    Procedure: Left Heart Cath and Coronary Angiography;  Surgeon: Wellington Hampshire, MD;  Location: [place] [facility];  Service: Cardiovascular;  Laterality: N/A;  . COLON RESECTION N/A 05/17/2017   Procedure: DIAGNOSTIC LAPAROSCOPY,;  Surgeon: Leighton Ruff, MD;  [facility];  Service: General;  Laterality: N/A;  . COLONOSCOPY WITH PROPOFOL N/A 04/24/2019   Procedure: COLONOSCOPY WITH PROPOFOL;  Surgeon: Irving Copas., MD;  [facility];  Service: Gastroenterology;  Laterality: N/A;  . CORONARY ANGIOPLASTY WITH STENT PLACEMENT    . CORONARY ARTERY BYPASS GRAFT N/A 07/06/2015   Procedure: CORONARY ARTERY BYPASS GRAFTING (CABG)x 4   utilizing the left internal mammary artery and endoscopically harvested bilateral  sapheneous vein.;  Surgeon: Ivin Poot, MD;  Location: [place];  Service: Open Heart Surgery;  Laterality: N/A;  . ESOPHAGEAL DILATION  04/24/2019   Procedure: ESOPHAGEAL DILATION;  Surgeon: Rush Landmark Telford Nab., MD;  [facility];  Service: Gastroenterology;;  . ESOPHAGOGASTRODUODENOSCOPY (EGD) WITH PROPOFOL N/A 06/25/2018   Procedure: ESOPHAGOGASTRODUODENOSCOPY (EGD) WITH PROPOFOL;  Surgeon: Irving Copas., MD;  Location: [place];  Service: Gastroenterology;  Laterality: N/A;  . ESOPHAGOGASTRODUODENOSCOPY (EGD) WITH PROPOFOL N/A 04/24/2019   Procedure: ESOPHAGOGASTRODUODENOSCOPY (EGD) WITH PROPOFOL;  Surgeon: Rush Landmark Telford Nab., MD;  [facility];  Service: Gastroenterology;  Laterality: N/A;  . FEMUR IM NAIL Right 11/16/2019   Procedure: RIGHT HIP INTRAMEDULLARY (IM) NAIL FEMORAL;  Surgeon: Meredith Pel, MD;  Location: [place];  Service: Orthopedics;  Laterality: Right;  . HEMOSTASIS CLIP PLACEMENT  04/24/2019   Procedure: HEMOSTASIS CLIP PLACEMENT;  Surgeon: Irving Copas., MD;  [facility];  Service: Gastroenterology;;  . KNEE SURGERY    . LOOP RECORDER INSERTION N/A 02/19/2018   Procedure: LOOP  RECORDER INSERTION;  Surgeon: Deboraha Sprang, MD;  Location: [place] [facility];  Service: Cardiovascular;  Laterality: N/A;  . POLYPECTOMY  04/24/2019   Procedure: POLYPECTOMY;  Surgeon: Rush Landmark Telford Nab., MD;  Location[facility];  Service: Gastroenterology;;  . Lia Foyer LIFTING INJECTION  04/24/2019   Procedure: SUBMUCOSAL LIFTING INJECTION;  Surgeon: Irving Copas., MD;  [facility];  Service: Gastroenterology;;  . TEE WITHOUT CARDIOVERSION N/A 07/06/2015   Procedure: TRANSESOPHAGEAL ECHOCARDIOGRAM (TEE);  Surgeon: Ivin Poot, MD;  Location: [place];  Service: Open Heart Surgery;  Laterality: N/A;    There were no vitals filed for this visit.   Subjective Assessment - 07/30/20 1325    Subjective He reports his Rt hip is terrible and does not allow him to stand long    Pertinent History PMH: repeated falls, pelvic fx, CVA X5, DM, CKD, CAD, CABGX4,mild dementia    Limitations Lifting;Standing;Walking;House hold activities    How long can you stand comfortably? one minute    Diagnostic tests see chart for lumbar/pelvic MRI and XR    Patient Stated Goals get legs stronger and walk again    Pain Onset More than a month ago            [facility] Adult PT Treatment/Exercise - 07/30/20 0001      Transfers   Transfers Sit to Stand    Sit to Stand 4: Min guard;With upper extremity assist      Ambulation/Gait   Ambulation/Gait Yes    Ambulation/Gait Assistance 4: Min guard    Ambulation Distance (Feet) 60 Feet   60 max tolerable amount due to hip pain, then 50, then 40   Assistive device Rolling walker      Exercises   Exercises Other Exercises    Other Exercises  seated rows and chest press with back unsupported 2X15 red, then shoulder extensios X 15 red      Knee/Hip Exercises: Aerobic   Nustep seat 13 Level 5  X8 min (at end of session).      Knee/Hip Exercises: Standing   Hip Flexion Both;15 reps;Knee bent    Hip Flexion Limitations 3 lbs    Hip  Abduction Both;2 sets;10 reps    Abduction Limitations with UE support, 3 lbs      Knee/Hip Exercises: Seated   Long Arc Quad Both;3 sets;10 reps    Long Arc Quad Weight 3 lbs.                    PT Short Term Goals - 07/30/20 1340      PT SHORT TERM GOAL #1   Title independent with initial HEP (Target for all STGs 07/10/20)    Time 4    Period Weeks    Status Achieved      PT SHORT TERM GOAL #2   Title Patient will ambulate with LRAD CGA at least 100 ft    Baseline 50 ft with RW    Status Achieved  PT SHORT TERM GOAL #3   Title Pt will ambulate 175 feet with RW    Baseline -    Time 4    Period Weeks    Status New    Target Date 08/27/20      PT SHORT TERM GOAL #4   Title -             PT Long Term Goals - 07/02/20 1354      PT LONG TERM GOAL #1   Title independent with updated HEP (Target all LTG 12 weeks  09/04/20)    Time 12    Period Weeks    Status On-going      PT LONG TERM GOAL #2   Title improve 5TSTS test to less than 25 seconds to show improved leg strength/endurance    Baseline 29 sec on 3/3    Time 12    Period Weeks    Status On-going      PT LONG TERM GOAL #3   Title Improve bilat leg strength to at least 4+/5 MMT measured in sitting to improve function    Baseline 4- to 4    Time 12    Period Weeks    Status On-going      PT LONG TERM GOAL #4   Title Will improve TUG to less than 30 sec to show improved balance and gait speed.    Baseline 46.9 with RW around cone    Time 12    Period Weeks    Status On-going      PT LONG TERM GOAL #5   Title Ambulate with LRAD mod I  for 300 ft or stand at least 10 min    Baseline 120 ft on 3/3 with RW    Time 12    Period Weeks    Status On-going                 Plan - 07/30/20 1338    Clinical Impression Statement he has not able to stand or walk as long this session due to hip pain which continues to bother him so we adjusted back his exercise today due to pain.  Recommend he have his hip looked at by MD if it continues to be in a lot of pain without any improvments.    Personal Factors and Comorbidities Comorbidity 3+;Time since onset of injury/illness/exacerbation;Past/Current Experience;Fitness    Comorbidities PMH: repeated falls, pelvic fx, CVA X5, DM, CKD, CAD, CABGX4,mild dementia    Examination-Activity Limitations Bathing;Bed Mobility;Bend;Carry;Lift;Stand;Toileting;Stairs;Squat;Locomotion Level    Examination-Participation Restrictions Art gallery manager;Shop    Stability/Clinical Decision Making Evolving/Moderate complexity    Rehab Potential Fair    PT Frequency 2x / week    PT Duration 8 weeks    PT Treatment/Interventions Aquatic Therapy;Cryotherapy;Dentist;Therapeutic activities;Therapeutic exercise;Neuromuscular re-education;Manual techniques;Patient/family education;Passive range of motion;Taping    PT Next Visit Plan leg strength, endurance, balance, gait with RW    PT Home Exercise Plan [facility]    Consulted and Agree with Plan of Care Patient           Patient will benefit from skilled therapeutic intervention in order to improve the following deficits and impairments:  Abnormal gait,Decreased activity tolerance,Decreased balance,Decreased endurance,Decreased coordination,Decreased range of motion,Decreased strength,Difficulty walking,Postural dysfunction,Pain,Improper body mechanics  Visit Diagnosis: Chronic bilateral low back pain with right-sided sciatica  Other abnormalities of gait and mobility  Muscle weakness (generalized)  Other symptoms and signs involving the nervous system  Problem List Patient Active Problem List   Diagnosis Date Noted  . Situational depression 01/22/2020  . Urinary frequency 01/22/2020  . Bacteremia due to Staphylococcus epidermidis 11/24/2019  . Stroke (cerebrum) ([place])   . Elevated troponin   . Closed right hip fracture  ([place]) 11/15/2019  . Unilateral primary osteoarthritis, right knee 11/13/2019  . At high risk for falls 05/29/2019  . Chronic bilateral low back pain without sciatica 02/07/2019  . Statin intolerance 11/16/2018  . Hyperlipidemia, mixed 08/15/2018  . Low back pain 07/18/2018  . History of lacunar cerebrovascular accident (CVA) 02/12/2018  . Dizziness 02/12/2018  . Myofascial pain 12/18/2017  . Spondylosis without myelopathy or radiculopathy, lumbar region 12/18/2017  . Lumbar radiculopathy, right 12/18/2017  . Cholelithiasis 05/17/2017  . Sinus Bradycardia 05/17/2017  . Thoracic radiculopathy 04/12/2017  . Primary osteoarthritis of right knee 12/14/2016  . DDD (degenerative disc disease), lumbar 10/10/2016  . Lumbar facet arthropathy 08/29/2016  . Idiopathic scoliosis 03/22/2016  . Diabetic neuropathy ([place]) 03/22/2016  . Dysautonomia orthostatic hypotension syndrome 02/25/2016  . CKD stage 3 due to type 2 diabetes mellitus ([place]) 02/19/2016  . Coronary artery disease involving coronary bypass graft of native heart without angina pectoris   . Diabetes mellitus type 2, diet-controlled ([place])   . 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 Medication management 08/11/2015  . 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 PVD (peripheral vascular disease) ([place]) 01/01/2014  . Hyperlipidemia associated with type 2 diabetes mellitus ([place]) 04/11/2013  . Supine hypertension   . Vitamin D deficiency   . Vitamin B 12 deficiency   . Coronary atherosclerosis- s/p PCI to LAD in 2009 and PCI to RCA in 2011 02/27/2009  . Abdominal aortic aneurysm ([place]) 02/27/2009  . GERD 02/27/2009    Silvestre Mesi 07/30/2020, 1:51 PM  [facility] Physical Therapy [address] [place], [place], [address] Phone: [phone]   Fax:  [phone]  Name: [name] MRN: [id number] Date of Birth: 12-01-1933

## 2020-08-04 NOTE — Progress Notes (Signed)
Carelink Summary Report / Loop Recorder 

## 2020-08-11 ENCOUNTER — Other Ambulatory Visit: Payer: Self-pay | Admitting: Adult Health

## 2020-08-11 DIAGNOSIS — M7061 Trochanteric bursitis, right hip: Secondary | ICD-10-CM

## 2020-08-12 ENCOUNTER — Other Ambulatory Visit: Payer: Self-pay

## 2020-08-12 ENCOUNTER — Encounter: Payer: Self-pay | Admitting: Physical Therapy

## 2020-08-12 ENCOUNTER — Ambulatory Visit: Payer: Medicare Other | Admitting: Physical Therapy

## 2020-08-12 DIAGNOSIS — G8929 Other chronic pain: Secondary | ICD-10-CM

## 2020-08-12 DIAGNOSIS — M5441 Lumbago with sciatica, right side: Secondary | ICD-10-CM | POA: Diagnosis not present

## 2020-08-12 DIAGNOSIS — M6281 Muscle weakness (generalized): Secondary | ICD-10-CM

## 2020-08-12 DIAGNOSIS — R29818 Other symptoms and signs involving the nervous system: Secondary | ICD-10-CM | POA: Diagnosis not present

## 2020-08-12 DIAGNOSIS — R2689 Other abnormalities of gait and mobility: Secondary | ICD-10-CM

## 2020-08-12 NOTE — Therapy (Signed)
[facility] Physical Therapy [address] [place], [place], [address] Phone: [phone]   Fax:  [phone]  Physical Therapy Treatment  Patient Details  Name: [name] MRN: [id number] Date of Birth: 11-10-1933 Referring Provider (PT): Royal Hawthorn, MD   Encounter Date: 08/12/2020   PT End of Session - 08/12/20 1344    Visit Number 14    Number of Visits 16    Date for PT Re-Evaluation 09/04/20    Authorization Type U[employer] MCR    Progress Note Due on Visit 20    PT Start Time 1345    PT Stop Time 1430    PT Time Calculation (min) 45 min    Equipment Utilized During Treatment Gait belt    Activity Tolerance Patient limited by fatigue    Behavior During Therapy [facility] for tasks assessed/performed           Past Medical History:  Diagnosis Date  . AAA (abdominal aortic aneurysm) ([place])   . Aneurysm of iliac artery (HCC)   . CAD (coronary artery disease)    a. s/p CABGx4 in 07/2015.  . Carotid artery disease ([place])   . Colon polyps   . Diabetes ([place])   . Difficult intubation   . 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 Esophageal reflux   . High cholesterol   . History of IBS 02/27/2009  . Hypertension    pt denies, he says he has a h/o hypotension. If BP up he adjusts the Florinef  . Jejunitis with partial SBO 05/20/2017  . Orthostatic hypotension   . Prostatitis   . Stroke ([place])   . TIA (transient ischemic attack)   . Upper GI bleed 06/24/2018  . Vitamin B 12 deficiency   . Vitamin D deficiency     Past Surgical History:  Procedure Laterality Date  . BIOPSY  06/25/2018   Procedure: BIOPSY;  Surgeon: Rush Landmark Telford Nab., MD;  Location: [place];  Service: Gastroenterology;;  . BIOPSY  04/24/2019   Procedure: BIOPSY;  Surgeon: Irving Copas., MD;  Location: [facility];  Service: Gastroenterology;;  . BLADDER SURGERY  1969   traumatic pelvic fractures, urethral and bladder repair  . CARDIAC CATHETERIZATION N/A 07/01/2015    Procedure: Left Heart Cath and Coronary Angiography;  Surgeon: Wellington Hampshire, MD;  Location: [place] [facility];  Service: Cardiovascular;  Laterality: N/A;  . COLON RESECTION N/A 05/17/2017   Procedure: DIAGNOSTIC LAPAROSCOPY,;  Surgeon: Leighton Ruff, MD;  [facility];  Service: General;  Laterality: N/A;  . COLONOSCOPY WITH PROPOFOL N/A 04/24/2019   Procedure: COLONOSCOPY WITH PROPOFOL;  Surgeon: Irving Copas., MD;  [facility];  Service: Gastroenterology;  Laterality: N/A;  . CORONARY ANGIOPLASTY WITH STENT PLACEMENT    . CORONARY ARTERY BYPASS GRAFT N/A 07/06/2015   Procedure: CORONARY ARTERY BYPASS GRAFTING (CABG)x 4   utilizing the left internal mammary artery and endoscopically harvested bilateral  sapheneous vein.;  Surgeon: Ivin Poot, MD;  Location: [place];  Service: Open Heart Surgery;  Laterality: N/A;  . ESOPHAGEAL DILATION  04/24/2019   Procedure: ESOPHAGEAL DILATION;  Surgeon: Rush Landmark Telford Nab., MD;  [facility];  Service: Gastroenterology;;  . ESOPHAGOGASTRODUODENOSCOPY (EGD) WITH PROPOFOL N/A 06/25/2018   Procedure: ESOPHAGOGASTRODUODENOSCOPY (EGD) WITH PROPOFOL;  Surgeon: Irving Copas., MD;  Location: [place];  Service: Gastroenterology;  Laterality: N/A;  . ESOPHAGOGASTRODUODENOSCOPY (EGD) WITH PROPOFOL N/A 04/24/2019   Procedure: ESOPHAGOGASTRODUODENOSCOPY (EGD) WITH PROPOFOL;  Surgeon: Rush Landmark Telford Nab., MD;  [facility];  Service: Gastroenterology;  Laterality: N/A;  . FEMUR IM NAIL Right 11/16/2019   Procedure: RIGHT HIP INTRAMEDULLARY (IM) NAIL FEMORAL;  Surgeon: Meredith Pel, MD;  Location: [place];  Service: Orthopedics;  Laterality: Right;  . HEMOSTASIS CLIP PLACEMENT  04/24/2019   Procedure: HEMOSTASIS CLIP PLACEMENT;  Surgeon: Irving Copas., MD;  [facility];  Service: Gastroenterology;;  . KNEE SURGERY    . LOOP RECORDER INSERTION N/A 02/19/2018   Procedure: LOOP  RECORDER INSERTION;  Surgeon: Deboraha Sprang, MD;  Location: [place] [facility];  Service: Cardiovascular;  Laterality: N/A;  . POLYPECTOMY  04/24/2019   Procedure: POLYPECTOMY;  Surgeon: Rush Landmark Telford Nab., MD;  Location: [facility];  Service: Gastroenterology;;  . Lia Foyer LIFTING INJECTION  04/24/2019   Procedure: SUBMUCOSAL LIFTING INJECTION;  Surgeon: Irving Copas., MD;  [facility];  Service: Gastroenterology;;  . TEE WITHOUT CARDIOVERSION N/A 07/06/2015   Procedure: TRANSESOPHAGEAL ECHOCARDIOGRAM (TEE);  Surgeon: Ivin Poot, MD;  Location: [place];  Service: Open Heart Surgery;  Laterality: N/A;    There were no vitals filed for this visit.   Subjective Assessment - 08/12/20 1345    Subjective He reports that right butt cheek is hurting more than normal with no known reason.    Pertinent History PMH: repeated falls, pelvic fx, CVA X5, DM, CKD, CAD, CABGX4,mild dementia    Limitations Lifting;Standing;Walking;House hold activities    How long can you stand comfortably? one minute    Diagnostic tests see chart for lumbar/pelvic MRI and XR    Patient Stated Goals get legs stronger and walk again    Currently in Pain? Yes    Pain Score 5    Since last PT appt, ranging from 0/10 to 7-8/10   Pain Location Buttocks    Pain Orientation Right;Posterior    Pain Descriptors / Indicators Aching;Sore    Pain Type Chronic pain    Pain Onset More than a month ago    Pain Frequency Intermittent    Aggravating Factors  sitting in w/c,    Pain Relieving Factors sitting in recliner or laying in bed.                             [place] Adult PT Treatment/Exercise - 08/12/20 1345      Transfers   Transfers Sit to Stand;Stand to Sit    Sit to Stand 4: Min guard;With upper extremity assist;With armrests;From chair/3-in-1   to RW   Stand to Sit 5: Supervision;With upper extremity assist;With armrests;To chair/3-in-1;Other (comment)   from RW      Ambulation/Gait   Ambulation/Gait Yes    Ambulation/Gait Assistance 4: Min guard    Ambulation Distance (Feet) 63 Feet   63' max tolerable amount due to hip pain & UE fatigue   Assistive device Rolling walker      Exercises   Exercises Other Exercises    Other Exercises  seated rows and chest press with back unsupported 2X15 red, then shoulder extensios X 15 red      Knee/Hip Exercises: Aerobic   Nustep seat 13 Level 5  X8 min (at end of session).      Knee/Hip Exercises: Standing   Hip Flexion Both;Knee bent;2 sets;10 reps;Stengthening   RW support   Hip Flexion Limitations 3 lbs    Hip Abduction Both;2 sets;10 reps    Abduction Limitations with UE support, 3 lbs      Knee/Hip  Exercises: Seated   Long Arc Quad Both;3 sets;10 reps    Long Arc Quad Weight 3 lbs.                    PT Short Term Goals - 07/30/20 1340      PT SHORT TERM GOAL #1   Title independent with initial HEP (Target for all STGs 07/10/20)    Time 4    Period Weeks    Status Achieved      PT SHORT TERM GOAL #2   Title Patient will ambulate with LRAD CGA at least 100 ft    Baseline 50 ft with RW    Status Achieved      PT SHORT TERM GOAL #3   Title Pt will ambulate 175 feet with RW    Baseline -    Time 4    Period Weeks    Status New    Target Date 08/27/20      PT SHORT TERM GOAL #4   Title -             PT Long Term Goals - 07/02/20 1354      PT LONG TERM GOAL #1   Title independent with updated HEP (Target all LTG 12 weeks  09/04/20)    Time 12    Period Weeks    Status On-going      PT LONG TERM GOAL #2   Title improve 5TSTS test to less than 25 seconds to show improved leg strength/endurance    Baseline 29 sec on 3/3    Time 12    Period Weeks    Status On-going      PT LONG TERM GOAL #3   Title Improve bilat leg strength to at least 4+/5 MMT measured in sitting to improve function    Baseline 4- to 4    Time 12    Period Weeks    Status On-going      PT LONG  TERM GOAL #4   Title Will improve TUG to less than 30 sec to show improved balance and gait speed.    Baseline 46.9 with RW around cone    Time 12    Period Weeks    Status On-going      PT LONG TERM GOAL #5   Title Ambulate with LRAD mod I  for 300 ft or stand at least 10 min    Baseline 120 ft on 3/3 with RW    Time 12    Period Weeks    Status On-going                 Plan - 08/12/20 1344    Clinical Impression Statement PT recommending that his wife check buttocks to ensure pain is not from pressure sore.  PT alternated between standing & seated exercises which improved tolerance.  He still fatigues quickly.    Personal Factors and Comorbidities Comorbidity 3+;Time since onset of injury/illness/exacerbation;Past/Current Experience;Fitness    Comorbidities PMH: repeated falls, pelvic fx, CVA X5, DM, CKD, CAD, CABGX4,mild dementia    Examination-Activity Limitations Bathing;Bed Mobility;Bend;Carry;Lift;Stand;Toileting;Stairs;Squat;Locomotion Level    Examination-Participation Restrictions Art gallery manager;Shop    Stability/Clinical Decision Making Evolving/Moderate complexity    Rehab Potential Fair    PT Frequency 2x / week    PT Duration 8 weeks    PT Treatment/Interventions Aquatic Therapy;Cryotherapy;Dentist;Therapeutic activities;Therapeutic exercise;Neuromuscular re-education;Manual techniques;Patient/family education;Passive range of motion;Taping    PT Next Visit Plan leg strength,  endurance, balance, gait with RW    PT Home Exercise Plan [facility]    Consulted and Agree with Plan of Care Patient           Patient will benefit from skilled therapeutic intervention in order to improve the following deficits and impairments:  Abnormal gait,Decreased activity tolerance,Decreased balance,Decreased endurance,Decreased coordination,Decreased range of motion,Decreased strength,Difficulty walking,Postural  dysfunction,Pain,Improper body mechanics  Visit Diagnosis: Chronic bilateral low back pain with right-sided sciatica  Other abnormalities of gait and mobility  Muscle weakness (generalized)  Other symptoms and signs involving the nervous system     Problem List Patient Active Problem List   Diagnosis Date Noted  . Situational depression 01/22/2020  . Urinary frequency 01/22/2020  . Bacteremia due to Staphylococcus epidermidis 11/24/2019  . Stroke (cerebrum) ([place])   . Elevated troponin   . Closed right hip fracture ([place]) 11/15/2019  . Unilateral primary osteoarthritis, right knee 11/13/2019  . At high risk for falls 05/29/2019  . Chronic bilateral low back pain without sciatica 02/07/2019  . Statin intolerance 11/16/2018  . Hyperlipidemia, mixed 08/15/2018  . Low back pain 07/18/2018  . History of lacunar cerebrovascular accident (CVA) 02/12/2018  . Dizziness 02/12/2018  . Myofascial pain 12/18/2017  . Spondylosis without myelopathy or radiculopathy, lumbar region 12/18/2017  . Lumbar radiculopathy, right 12/18/2017  . Cholelithiasis 05/17/2017  . Sinus Bradycardia 05/17/2017  . Thoracic radiculopathy 04/12/2017  . Primary osteoarthritis of right knee 12/14/2016  . DDD (degenerative disc disease), lumbar 10/10/2016  . Lumbar facet arthropathy 08/29/2016  . Idiopathic scoliosis 03/22/2016  . Diabetic neuropathy ([place]) 03/22/2016  . Dysautonomia orthostatic hypotension syndrome 02/25/2016  . CKD stage 3 due to type 2 diabetes mellitus ([place]) 02/19/2016  . Coronary artery disease involving coronary bypass graft of native heart without angina pectoris   . Diabetes mellitus type 2, diet-controlled ([place])   . 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 Medication management 08/11/2015  . 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 PVD (peripheral vascular disease) ([place]) 01/01/2014  . Hyperlipidemia associated with type 2 diabetes mellitus ([place]) 04/11/2013  . Supine hypertension   . Vitamin D  deficiency   . Vitamin B 12 deficiency   . Coronary atherosclerosis- s/p PCI to LAD in 2009 and PCI to RCA in 2011 02/27/2009  . Abdominal aortic aneurysm ([place]) 02/27/2009  . GERD 02/27/2009    Jamey Reas, PT, DPT 08/12/2020, 2:24 PM  [facility] Physical Therapy [address] [place], [place], [address] Phone: [phone]   Fax:  [phone]  Name: [name] MRN: [id number] Date of Birth: 01/23/1934

## 2020-08-12 NOTE — Progress Notes (Signed)
HPI: FU CAD,h/o HTN,postural hypotension andHLD. Cath 3/17revealed 3 V CAD, he underwent CABG x 4 on 07/07/15. Post op course was unremarkable. He has had difficulties with orthostasis and hypotension sinceCABG.Had loop monitor placed October 2019. Carotid Dopplers June 2021 showed 40 to 59% bilateral stenosis.  Patient suffered a mechanical fall July 2021 requiring repair of right hip fracture.  Postoperative course complicated by acute CVA.  Also with elevated troponin felt to be demand ischemia.  Echocardiogram July 2021 showed normal LV function, moderate left ventricular hypertrophy, grade 2 diastolic dysfunction, mild left atrial enlargement.  Abdominal ultrasound October 2021 showed abdominal aortic aneurysm measuring 4.2 cm, left common iliac artery measuring 3.1 cm.  Patient seen by vascular surgery and follow-up ultrasound recommended in 9 months.  Since last seen,  patient denies dyspnea, chest pain, palpitations or syncope.  He continues to have some dizziness with standing at times.  Current Outpatient Medications  Medication Sig Dispense Refill  . acetaminophen (TYLENOL) 500 MG tablet Take 1,000 mg by mouth in the morning and at bedtime.    Marland Kitchen amLODipine (NORVASC) 5 MG tablet TAKE 1 TABLET BY MOUTH DAILY FOR BP 90 tablet 3  . aspirin EC 81 MG tablet Take 2 tablets (162 mg total) by mouth daily.    Marland Kitchen buPROPion (WELLBUTRIN XL) 150 MG 24 hr tablet TAKE 1 TABLET BY MOUTH EVERY DAY IN THE MORNING 90 tablet 3  . butalbital-acetaminophen-caffeine (FIORICET) 50-325-40 MG tablet Take     1 tablet      every 4 hours       ONLY       if needed for Severe Headache 30 tablet 0  . Calcium Carb-Cholecalciferol (CALCIUM+D3 PO) Take 1 tablet by mouth daily.    . Cholecalciferol (VITAMIN D-3) 5000 units TABS Take 5,000 Units by mouth daily.    Marland Kitchen dexamethasone (DECADRON) 4 MG tablet Take 1 tab 3 x day - 3 days, then 2 x day - 3 days, then 1 tab daily 20 tablet 0  . ferrous sulfate 325 (65 FE) MG  tablet Take 325 mg by mouth daily with breakfast.    . fludrocortisone (FLORINEF) 0.1 MG tablet Take 2 tablets Daily for Low BP (Patient taking differently: Take 0.1 mg by mouth daily. Daily for Low BP) 180 tablet 0  . glucose blood test strip Check blood sugar 1 time daily-DX-E11.9 (Patient taking differently: 1 each by Other route as needed. Check blood sugar 1 time daily-DX-E11.9) 100 each 5  . levothyroxine (SYNTHROID) 25 MCG tablet TAKE 1 TABLET BY MOUTH EVERY DAY ON EMPTY STOMACH WITH ONLY WATER FOR 30 MINS 90 tablet 1  . meloxicam (MOBIC) 15 MG tablet Take  1/2 to 1 tablet  Daily  with Food  for Pain & Inflammation 90 tablet 1  . memantine (NAMENDA TITRATION PAK) tablet pack 5 mg/day for =1 week; 5 mg twice daily for =1 week; 15 mg/day given in 5 mg and 10 mg separated doses for =1 week; then 10 mg twice daily 49 tablet 12  . memantine (NAMENDA) 10 MG tablet TAKE 1 TABLET BY MOUTH TWICE A DAY 180 tablet 2  . Multiple Vitamin (MULTIVITAMIN WITH MINERALS) TABS tablet Take 1 tablet by mouth daily.    Marland Kitchen olmesartan (BENICAR) 40 MG tablet Take 40 mg by mouth daily as needed.    . pantoprazole (PROTONIX) 40 MG tablet Take      1 tablet      Daily  to Prevent Indigestion & Heartburn 90 tablet 3  . polyethylene glycol (MIRALAX / GLYCOLAX) packet Take 17 g by mouth daily. (Patient taking differently: Take 17 g by mouth 2 (two) times daily.) 28 each 1  . potassium chloride (KLOR-CON) 10 MEQ tablet Take 10 mEq by mouth daily. 1 Tablet Daily    . pravastatin (PRAVACHOL) 40 MG tablet Take     1 tablet     at Bedtime         for Cholesterol 90 tablet 1  . tamsulosin (FLOMAX) 0.4 MG CAPS capsule TAKE 1 TABLET 1 HOUR BEFORE BEDTIME FOR BLADDER EMPTYING 90 capsule 1  . vitamin B-12 (CYANOCOBALAMIN) 250 MCG tablet Take 250 mcg by mouth daily. 1 Tablet Daily    . Zinc 50 MG TABS Take 1 tablet by mouth daily.     No current facility-administered medications for this visit.     Past Medical History:   Diagnosis Date  . AAA (abdominal aortic aneurysm) ([place])   . Aneurysm of iliac artery (HCC)   . CAD (coronary artery disease)    a. s/p CABGx4 in 07/2015.  . Carotid artery disease ([place])   . Colon polyps   . Diabetes ([place])   . Difficult intubation   . 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 Esophageal reflux   . High cholesterol   . History of IBS 02/27/2009  . Hypertension    pt denies, he says he has a h/o hypotension. If BP up he adjusts the Florinef  . Jejunitis with partial SBO 05/20/2017  . Orthostatic hypotension   . Prostatitis   . Stroke ([place])   . TIA (transient ischemic attack)   . Upper GI bleed 06/24/2018  . Vitamin B 12 deficiency   . Vitamin D deficiency     Past Surgical History:  Procedure Laterality Date  . BIOPSY  06/25/2018   Procedure: BIOPSY;  Surgeon: Rush Landmark Telford Nab., MD;  Location: [place];  Service: Gastroenterology;;  . BIOPSY  04/24/2019   Procedure: BIOPSY;  Surgeon: Irving Copas., MD;  Location[facility];  Service: Gastroenterology;;  . BLADDER SURGERY  1969   traumatic pelvic fractures, urethral and bladder repair  . CARDIAC CATHETERIZATION N/A 07/01/2015   Procedure: Left Heart Cath and Coronary Angiography;  Surgeon: Wellington Hampshire, MD;  Location: [place] [facility];  Service: Cardiovascular;  Laterality: N/A;  . COLON RESECTION N/A 05/17/2017   Procedure: DIAGNOSTIC LAPAROSCOPY,;  Surgeon: Leighton Ruff, MD;  [facility];  Service: General;  Laterality: N/A;  . COLONOSCOPY WITH PROPOFOL N/A 04/24/2019   Procedure: COLONOSCOPY WITH PROPOFOL;  Surgeon: Irving Copas., MD;  [facility];  Service: Gastroenterology;  Laterality: N/A;  . CORONARY ANGIOPLASTY WITH STENT PLACEMENT    . CORONARY ARTERY BYPASS GRAFT N/A 07/06/2015   Procedure: CORONARY ARTERY BYPASS GRAFTING (CABG)x 4   utilizing the left internal mammary artery and endoscopically harvested bilateral  sapheneous vein.;  Surgeon: Ivin Poot,  MD;  Location: [place];  Service: Open Heart Surgery;  Laterality: N/A;  . ESOPHAGEAL DILATION  04/24/2019   Procedure: ESOPHAGEAL DILATION;  Surgeon: Rush Landmark Telford Nab., MD;  [facility];  Service: Gastroenterology;;  . ESOPHAGOGASTRODUODENOSCOPY (EGD) WITH PROPOFOL N/A 06/25/2018   Procedure: ESOPHAGOGASTRODUODENOSCOPY (EGD) WITH PROPOFOL;  Surgeon: Irving Copas., MD;  Location: [place];  Service: Gastroenterology;  Laterality: N/A;  . ESOPHAGOGASTRODUODENOSCOPY (EGD) WITH PROPOFOL N/A 04/24/2019   Procedure: ESOPHAGOGASTRODUODENOSCOPY (EGD) WITH PROPOFOL;  Surgeon: Irving Copas., MD;  [facility];  Service: Gastroenterology;  Laterality: N/A;  . FEMUR IM NAIL Right 11/16/2019   Procedure: RIGHT HIP INTRAMEDULLARY (IM) NAIL FEMORAL;  Surgeon: Meredith Pel, MD;  Location: [place];  Service: Orthopedics;  Laterality: Right;  . HEMOSTASIS CLIP PLACEMENT  04/24/2019   Procedure: HEMOSTASIS CLIP PLACEMENT;  Surgeon: Irving Copas., MD;  [facility];  Service: Gastroenterology;;  . KNEE SURGERY    . LOOP RECORDER INSERTION N/A 02/19/2018   Procedure: LOOP RECORDER INSERTION;  Surgeon: Deboraha Sprang, MD;  Location: [place] [facility];  Service: Cardiovascular;  Laterality: N/A;  . POLYPECTOMY  04/24/2019   Procedure: POLYPECTOMY;  Surgeon: Rush Landmark Telford Nab., MD;  Location[facility];  Service: Gastroenterology;;  . Lia Foyer LIFTING INJECTION  04/24/2019   Procedure: SUBMUCOSAL LIFTING INJECTION;  Surgeon: Irving Copas., MD;  [facility];  Service: Gastroenterology;;  . TEE WITHOUT CARDIOVERSION N/A 07/06/2015   Procedure: TRANSESOPHAGEAL ECHOCARDIOGRAM (TEE);  Surgeon: Ivin Poot, MD;  Location: [place];  Service: Open Heart Surgery;  Laterality: N/A;    Social History   Socioeconomic History  . Marital status: Married    Spouse name: Estill Bamberg  . Number of children: 2  . Years of  education: Not on file  . Highest education level: High school graduate  Occupational History  . Occupation: Retired  Tobacco Use  . Smoking status: Former Smoker    Quit date: 07/16/1963    Years since quitting: 40.1  . Smokeless tobacco: Former Systems developer    Types: [place] date: 1989  Media planner  . Vaping Use: Never used  Substance and Sexual Activity  . Alcohol use: No  . Drug use: No  . Sexual activity: Not Currently  Other Topics Concern  . Not on file  Social History Narrative   Lives at home with his wife Estill Bamberg   Right handed   No caffeine   Social Determinants of Radio broadcast assistant Strain: Not on file  Food Insecurity: Not on file  Transportation Needs: Not on file  Physical Activity: Not on file  Stress: Not on file  Social Connections: Not on file  Intimate Partner Violence: Not on file    Family History  Problem Relation Age of Onset  . Heart attack Father        died age 3  . Heart attack Brother        died age 34  . Anuerysm Brother        aortic  . Heart attack Sister        died age 71  . Colon cancer Sister   . Liver cancer Sister   . Diabetes Maternal Grandmother   . Arthritis Mother   . Dementia Mother   . Heart Problems Brother   . Heart Problems Brother   . Heart Problems Brother   . Heart Problems Brother   . Healthy Daughter   . Healthy Son   . Esophageal cancer Neg Hx   . Inflammatory bowel disease Neg Hx   . Pancreatic cancer Neg Hx   . Stomach cancer Neg Hx     ROS: Limited mobility due to leg weakness but no fevers or chills, productive cough, hemoptysis, dysphasia, odynophagia, melena, hematochezia, dysuria, hematuria, rash, seizure activity, orthopnea, PND, pedal edema, claudication. Remaining systems are negative.  Physical Exam: Well-developed well-nourished in no acute distress.  Skin is warm and dry.  HEENT is normal.  Neck is supple.  Chest is clear to auscultation with normal expansion.  Cardiovascular  exam is regular rate and rhythm.  Abdominal exam nontender or distended. No masses palpated. Extremities show no edema. neuro grossly intact  A/P  1 coronary artery disease-no chest pain.  Continue aspirin and statin.  2 hyperlipidemia-continue statin.  3 abdominal aortic aneurysm/iliac aneurysm-now followed by vascular surgery.  4 history of CVA-continue to monitor implantable loop.  5 history of orthostasis-reasonly well controlled at present.  We discussed the importance of increased fluid intake.  6 carotid artery disease-he will need follow-up carotid Dopplers June 2022.  Kirk Ruths, MD

## 2020-08-17 ENCOUNTER — Other Ambulatory Visit: Payer: Self-pay

## 2020-08-17 ENCOUNTER — Ambulatory Visit: Payer: Medicare Other | Admitting: Physical Therapy

## 2020-08-17 DIAGNOSIS — R29818 Other symptoms and signs involving the nervous system: Secondary | ICD-10-CM

## 2020-08-17 DIAGNOSIS — R2689 Other abnormalities of gait and mobility: Secondary | ICD-10-CM

## 2020-08-17 DIAGNOSIS — M6281 Muscle weakness (generalized): Secondary | ICD-10-CM

## 2020-08-17 DIAGNOSIS — M5441 Lumbago with sciatica, right side: Secondary | ICD-10-CM | POA: Diagnosis not present

## 2020-08-17 DIAGNOSIS — R42 Dizziness and giddiness: Secondary | ICD-10-CM

## 2020-08-17 DIAGNOSIS — M545 Low back pain, unspecified: Secondary | ICD-10-CM

## 2020-08-17 DIAGNOSIS — G8929 Other chronic pain: Secondary | ICD-10-CM

## 2020-08-17 NOTE — Therapy (Signed)
[facility] [address] [place], [place], [address] Phone: [phone]   Fax:  [phone]  Physical Therapy Treatment  Patient Details  Name: [name] MRN: [id number] Date of Birth: 1934/02/11 Referring Provider (PT): Royal Hawthorn, MD   Encounter Date: 08/17/2020   PT End of Session - 08/17/20 1453    Visit Number 15    Number of Visits 16    Date for PT Re-Evaluation 09/04/20    Authorization Type [employer] MCR    Progress Note Due on Visit 20    PT Start Time 1430    PT Stop Time 1510    PT Time Calculation (min) 40 min    Equipment Utilized During Treatment Gait belt    Activity Tolerance Patient limited by fatigue    Behavior During Therapy [facility] for tasks assessed/performed           Past Medical History:  Diagnosis Date  . AAA (abdominal aortic aneurysm) ([place])   . Aneurysm of iliac artery ([facility])   . CAD (coronary artery disease)    a. s/p CABGx4 in 07/2015.  . Carotid artery disease ([place])   . Colon polyps   . Diabetes ([place])   . Difficult intubation   . 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 Esophageal reflux   . High cholesterol   . History of IBS 02/27/2009  . Hypertension    pt denies, he says he has a h/o hypotension. If BP up he adjusts the [place]  . Jejunitis with partial SBO 05/20/2017  . Orthostatic hypotension   . Prostatitis   . Stroke ([place])   . TIA (transient ischemic attack)   . Upper GI bleed 06/24/2018  . Vitamin B 12 deficiency   . Vitamin D deficiency     Past Surgical History:  Procedure Laterality Date  . BIOPSY  06/25/2018   Procedure: BIOPSY;  Surgeon: Rush Landmark Telford Nab., MD;  Location: [facility];  Service: Gastroenterology;;  . BIOPSY  04/24/2019   Procedure: BIOPSY;  Surgeon: Irving Copas., MD;  Location: [facility];  Service: Gastroenterology;;  . BLADDER SURGERY  1969   traumatic pelvic fractures, urethral and bladder repair  . CARDIAC CATHETERIZATION N/A 07/01/2015    Procedure: Left Heart Cath and Coronary Angiography;  Surgeon: Wellington Hampshire, MD;  Location: [facility];  Service: Cardiovascular;  Laterality: N/A;  . COLON RESECTION N/A 05/17/2017   Procedure: DIAGNOSTIC LAPAROSCOPY,;  Surgeon: Leighton Ruff, MD;  Location: [facility];  Service: General;  Laterality: N/A;  . COLONOSCOPY WITH PROPOFOL N/A 04/24/2019   Procedure: COLONOSCOPY WITH PROPOFOL;  Surgeon: Irving Copas., MD;  [facility];  Service: Gastroenterology;  Laterality: N/A;  . CORONARY ANGIOPLASTY WITH STENT PLACEMENT    . CORONARY ARTERY BYPASS GRAFT N/A 07/06/2015   Procedure: CORONARY ARTERY BYPASS GRAFTING (CABG)x 4   utilizing the left internal mammary artery and endoscopically harvested bilateral  sapheneous vein.;  Surgeon: Ivin Poot, MD;  Location: [facility];  Service: Open Heart Surgery;  Laterality: N/A;  . ESOPHAGEAL DILATION  04/24/2019   Procedure: ESOPHAGEAL DILATION;  Surgeon: Rush Landmark Telford Nab., MD;  [facility]: [facility];  Service: Gastroenterology;;  . ESOPHAGOGASTRODUODENOSCOPY (EGD) WITH PROPOFOL N/A 06/25/2018   Procedure: ESOPHAGOGASTRODUODENOSCOPY (EGD) WITH PROPOFOL;  Surgeon: Irving Copas., MD;  Location: [facility];  Service: Gastroenterology;  Laterality: N/A;  . ESOPHAGOGASTRODUODENOSCOPY (EGD) WITH PROPOFOL N/A 04/24/2019   Procedure: ESOPHAGOGASTRODUODENOSCOPY (EGD) WITH PROPOFOL;  Surgeon: Rush Landmark Telford Nab., MD;  [facility];  Service: Gastroenterology;  Laterality: N/A;  . FEMUR IM NAIL Right 11/16/2019   Procedure: RIGHT HIP INTRAMEDULLARY (IM) NAIL FEMORAL;  Surgeon: Meredith Pel, MD;  Location: [facility];  Service: Orthopedics;  Laterality: Right;  . HEMOSTASIS CLIP PLACEMENT  04/24/2019   Procedure: HEMOSTASIS CLIP PLACEMENT;  Surgeon: Irving Copas., MD;  [facility];  Service: Gastroenterology;;  . KNEE SURGERY    . LOOP RECORDER INSERTION N/A 02/19/2018   Procedure: LOOP  RECORDER INSERTION;  Surgeon: Deboraha Sprang, MD;  Location: [facility] [facility];  Service: Cardiovascular;  Laterality: N/A;  . POLYPECTOMY  04/24/2019   Procedure: POLYPECTOMY;  Surgeon: Rush Landmark Telford Nab., MD;  Location: [facility];  Service: Gastroenterology;;  . Lia Foyer LIFTING INJECTION  04/24/2019   Procedure: SUBMUCOSAL LIFTING INJECTION;  Surgeon: Irving Copas., MD;  [facility];  Service: Gastroenterology;;  . TEE WITHOUT CARDIOVERSION N/A 07/06/2015   Procedure: TRANSESOPHAGEAL ECHOCARDIOGRAM (TEE);  Surgeon: Ivin Poot, MD;  Location: [facility];  Service: Open Heart Surgery;  Laterality: N/A;    There were no vitals filed for this visit.   Subjective Assessment - 08/17/20 1452    Subjective He reports that right butt cheek continues to hurt. His wife does skin checks and he does not have any pressure ulcers here.    Pertinent History PMH: repeated falls, pelvic fx, CVA X5, DM, CKD, CAD, CABGX4,mild dementia    Limitations Lifting;Standing;Walking;House hold activities    How long can you stand comfortably? one minute    Diagnostic tests see chart for lumbar/pelvic MRI and XR    Patient Stated Goals get legs stronger and walk again    Pain Onset More than a month ago                             [facility] Adult PT Treatment/Exercise - 08/17/20 0001      Ambulation/Gait   Ambulation/Gait Yes    Ambulation/Gait Assistance 4: Min guard    Ambulation Distance (Feet) 68 Feet   max tolerated distance, then 54 ft at end of session   Assistive device Rolling walker      Knee/Hip Exercises: Aerobic   Nustep seat 13 Level 5  X8 min (at end of session).      Knee/Hip Exercises: Standing   Hip Abduction Both;2 sets;10 reps    Abduction Limitations with UE support, 3 lbs      Knee/Hip Exercises: Seated   Long Arc Quad Both;3 sets;10 reps    Long Arc Quad Weight 3 lbs.      Knee/Hip Exercises: Supine   Other Supine Knee/Hip Exercises  clams green X20      Manual Therapy   Manual therapy comments Rt leg long axis distraction, Rt hip PROM to tolerance all planes                    PT Short Term Goals - 07/30/20 1340      PT SHORT TERM GOAL #1   Title independent with initial HEP (Target for all STGs 07/10/20)    Time 4    Period Weeks    Status Achieved      PT SHORT TERM GOAL #2   Title Patient will ambulate with LRAD CGA at least 100 ft    Baseline 50 ft with RW    Status Achieved      PT SHORT TERM GOAL #3   Title Pt  will ambulate 175 feet with RW    Baseline -    Time 4    Period Weeks    Status New    Target Date 08/27/20      PT SHORT TERM GOAL #4   Title -             PT Long Term Goals - 07/02/20 1354      PT LONG TERM GOAL #1   Title independent with updated HEP (Target all LTG 12 weeks  09/04/20)    Time 12    Period Weeks    Status On-going      PT LONG TERM GOAL #2   Title improve 5TSTS test to less than 25 seconds to show improved leg strength/endurance    Baseline 29 sec on 3/3    Time 12    Period Weeks    Status On-going      PT LONG TERM GOAL #3   Title Improve bilat leg strength to at least 4+/5 MMT measured in sitting to improve function    Baseline 4- to 4    Time 12    Period Weeks    Status On-going      PT LONG TERM GOAL #4   Title Will improve TUG to less than 30 sec to show improved balance and gait speed.    Baseline 46.9 with RW around cone    Time 12    Period Weeks    Status On-going      PT LONG TERM GOAL #5   Title Ambulate with LRAD mod I  for 300 ft or stand at least 10 min    Baseline 120 ft on 3/3 with RW    Time 12    Period Weeks    Status On-going                 Plan - 08/17/20 1509    Clinical Impression Statement He did not have any signs of pressure ulcer when PT performed skin check to his Rt posterior hip. Pain in this area continues to limit his standing tolerance and ambulation tolerance. PT trialed more manual  therapy to his Rt hip to see if this helps with any pain relief.    Personal Factors and Comorbidities Comorbidity 3+;Time since onset of injury/illness/exacerbation;Past/Current Experience;Fitness    Comorbidities PMH: repeated falls, pelvic fx, CVA X5, DM, CKD, CAD, CABGX4,mild dementia    Examination-Activity Limitations Bathing;Bed Mobility;Bend;Carry;Lift;Stand;Toileting;Stairs;Squat;Locomotion Level    Examination-Participation Restrictions Art gallery manager;Shop    Stability/Clinical Decision Making Evolving/Moderate complexity    Rehab Potential Fair    PT Frequency 2x / week    PT Duration 8 weeks    PT Treatment/Interventions Aquatic Therapy;Cryotherapy;Dentist;Therapeutic activities;Therapeutic exercise;Neuromuscular re-education;Manual techniques;Patient/family education;Passive range of motion;Taping    PT Next Visit Plan how was manual therapy last time? leg strength, endurance, balance, gait with RW    PT Home Exercise Plan [facility]    Consulted and Agree with Plan of Care Patient           Patient will benefit from skilled therapeutic intervention in order to improve the following deficits and impairments:  Abnormal gait,Decreased activity tolerance,Decreased balance,Decreased endurance,Decreased coordination,Decreased range of motion,Decreased strength,Difficulty walking,Postural dysfunction,Pain,Improper body mechanics  Visit Diagnosis: Chronic bilateral low back pain with right-sided sciatica  Other abnormalities of gait and mobility  Muscle weakness (generalized)  Other symptoms and signs involving the nervous system  Chronic bilateral low back pain, unspecified whether sciatica present  Dizziness and giddiness     Problem List Patient Active Problem List   Diagnosis Date Noted  . Situational depression 01/22/2020  . Urinary frequency 01/22/2020  . Bacteremia due to Staphylococcus epidermidis  11/24/2019  . Stroke (cerebrum) ([place])   . Elevated troponin   . Closed right hip fracture ([place]) 11/15/2019  . Unilateral primary osteoarthritis, right knee 11/13/2019  . At high risk for falls 05/29/2019  . Chronic bilateral low back pain without sciatica 02/07/2019  . Statin intolerance 11/16/2018  . Hyperlipidemia, mixed 08/15/2018  . Low back pain 07/18/2018  . History of lacunar cerebrovascular accident (CVA) 02/12/2018  . Dizziness 02/12/2018  . Myofascial pain 12/18/2017  . Spondylosis without myelopathy or radiculopathy, lumbar region 12/18/2017  . Lumbar radiculopathy, right 12/18/2017  . Cholelithiasis 05/17/2017  . Sinus Bradycardia 05/17/2017  . Thoracic radiculopathy 04/12/2017  . Primary osteoarthritis of right knee 12/14/2016  . DDD (degenerative disc disease), lumbar 10/10/2016  . Lumbar facet arthropathy 08/29/2016  . Idiopathic scoliosis 03/22/2016  . Diabetic neuropathy ([place]) 03/22/2016  . Dysautonomia orthostatic hypotension syndrome 02/25/2016  . CKD stage 3 due to type 2 diabetes mellitus ([place]) 02/19/2016  . Coronary artery disease involving coronary bypass graft of native heart without angina pectoris   . Diabetes mellitus type 2, diet-controlled ([place])   . 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 Medication management 08/11/2015  . 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 PVD (peripheral vascular disease) ([place]) 01/01/2014  . Hyperlipidemia associated with type 2 diabetes mellitus ([place]) 04/11/2013  . Supine hypertension   . Vitamin D deficiency   . Vitamin B 12 deficiency   . Coronary atherosclerosis- s/p PCI to LAD in 2009 and PCI to RCA in 2011 02/27/2009  . Abdominal aortic aneurysm ([place]) 02/27/2009  . GERD 02/27/2009    Silvestre Mesi 08/17/2020, 3:17 PM  [facility] [address] [place], [place], [address] Phone: [phone]   Fax:  [phone]  Name: ANGELGABRIEL WILLMORE MRN: [id number] Date of Birth: 1934/01/01

## 2020-08-18 ENCOUNTER — Ambulatory Visit: Payer: Medicare Other | Admitting: Cardiology

## 2020-08-18 ENCOUNTER — Encounter: Payer: Self-pay | Admitting: Cardiology

## 2020-08-18 VITALS — BP 149/81 | HR 79 | Ht 72.0 in | Wt 182.0 lb

## 2020-08-18 DIAGNOSIS — I2510 Atherosclerotic heart disease of native coronary artery without angina pectoris: Secondary | ICD-10-CM

## 2020-08-18 DIAGNOSIS — I951 Orthostatic hypotension: Secondary | ICD-10-CM

## 2020-08-18 DIAGNOSIS — I723 Aneurysm of iliac artery: Secondary | ICD-10-CM

## 2020-08-18 DIAGNOSIS — I714 Abdominal aortic aneurysm, without rupture, unspecified: Secondary | ICD-10-CM

## 2020-08-18 DIAGNOSIS — E7800 Pure hypercholesterolemia, unspecified: Secondary | ICD-10-CM

## 2020-08-18 DIAGNOSIS — I6523 Occlusion and stenosis of bilateral carotid arteries: Secondary | ICD-10-CM

## 2020-08-18 NOTE — Patient Instructions (Signed)
  Testing/Procedures:  Your physician has requested that you have a carotid duplex. This test is an ultrasound of the carotid arteries in your neck. It looks at blood flow through these arteries that supply the brain with blood. Allow one hour for this exam. There are no restrictions or special instructions.DUE IN JUNE     Follow-Up: At [facility], you and your health needs are our priority.  As part of our continuing mission to provide you with exceptional heart care, we have created designated Provider Care Teams.  These Care Teams include your primary Cardiologist (physician) and Advanced Practice Providers (APPs -  Physician Assistants and Nurse Practitioners) who all work together to provide you with the care you need, when you need it.  We recommend signing up for the patient portal called "MyChart".  Sign up information is provided on this After Visit Summary.  MyChart is used to connect with patients for Virtual Visits (Telemedicine).  Patients are able to view lab/test results, encounter notes, upcoming appointments, etc.  Non-urgent messages can be sent to your provider as well.   To learn more about what you can do with MyChart, go to [email or web address].    Your next appointment:   12 month(s)  The format for your next appointment:   In Person  Provider:   Kirk Ruths, MD

## 2020-08-19 ENCOUNTER — Ambulatory Visit: Payer: Medicare Other | Admitting: Physical Therapy

## 2020-08-19 ENCOUNTER — Other Ambulatory Visit: Payer: Self-pay

## 2020-08-19 DIAGNOSIS — M5441 Lumbago with sciatica, right side: Secondary | ICD-10-CM

## 2020-08-19 DIAGNOSIS — R2689 Other abnormalities of gait and mobility: Secondary | ICD-10-CM

## 2020-08-19 DIAGNOSIS — G8929 Other chronic pain: Secondary | ICD-10-CM

## 2020-08-19 DIAGNOSIS — M6281 Muscle weakness (generalized): Secondary | ICD-10-CM

## 2020-08-19 DIAGNOSIS — R29818 Other symptoms and signs involving the nervous system: Secondary | ICD-10-CM

## 2020-08-19 NOTE — Therapy (Signed)
[facility] Physical Therapy [address] [place], [place], [address] Phone: [phone]   Fax:  [phone]  Physical Therapy Treatment/Recert and progress note Progress Note reporting period date 07/21/20 to 08/19/20  See below for objective and subjective measurements relating to patients progress with PT.   Patient Details  Name: [name] MRN: [id number] Date of Birth: November 23, 1933 Referring Provider (PT): Royal Hawthorn, MD   Encounter Date: 08/19/2020   PT End of Session - 08/19/20 1337    Visit Number 16    Number of Visits 25    Date for PT Re-Evaluation 09/30/20    Authorization Type [employer] MCR    Progress Note Due on Visit 26    PT Start Time 1300    PT Stop Time 1344    PT Time Calculation (min) 44 min    Equipment Utilized During Treatment Gait belt    Activity Tolerance Patient limited by fatigue    Behavior During Therapy WFL for tasks assessed/performed           Past Medical History:  Diagnosis Date  . AAA (abdominal aortic aneurysm) ([place])   . Aneurysm of iliac artery ([facility])   . CAD (coronary artery disease)    a. s/p CABGx4 in 07/2015.  . Carotid artery disease ([place])   . Colon polyps   . Diabetes ([place])   . Difficult intubation   . 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 Esophageal reflux   . High cholesterol   . History of IBS 02/27/2009  . Hypertension    pt denies, he says he has a h/o hypotension. If BP up he adjusts the [place]  . Jejunitis with partial SBO 05/20/2017  . Orthostatic hypotension   . Prostatitis   . Stroke ([place])   . TIA (transient ischemic attack)   . Upper GI bleed 06/24/2018  . Vitamin B 12 deficiency   . Vitamin D deficiency     Past Surgical History:  Procedure Laterality Date  . BIOPSY  06/25/2018   Procedure: BIOPSY;  Surgeon: Rush Landmark Telford Nab., MD;  Location: [place];  Service: Gastroenterology;;  . BIOPSY  04/24/2019   Procedure: BIOPSY;  Surgeon: Irving Copas., MD;  Location[facility];  Service: Gastroenterology;;  . BLADDER SURGERY  1969   traumatic pelvic fractures, urethral and bladder repair  . CARDIAC CATHETERIZATION N/A 07/01/2015   Procedure: Left Heart Cath and Coronary Angiography;  Surgeon: Wellington Hampshire, MD;  Location: [facility] [facility];  Service: Cardiovascular;  Laterality: N/A;  . COLON RESECTION N/A 05/17/2017   Procedure: DIAGNOSTIC LAPAROSCOPY,;  Surgeon: Leighton Ruff, MD;  Location: [facility];  Service: General;  Laterality: N/A;  . COLONOSCOPY WITH PROPOFOL N/A 04/24/2019   Procedure: COLONOSCOPY WITH PROPOFOL;  Surgeon: Irving Copas., MD;  [facility];  Service: Gastroenterology;  Laterality: N/A;  . CORONARY ANGIOPLASTY WITH STENT PLACEMENT    . CORONARY ARTERY BYPASS GRAFT N/A 07/06/2015   Procedure: CORONARY ARTERY BYPASS GRAFTING (CABG)x 4   utilizing the left internal mammary artery and endoscopically harvested bilateral  sapheneous vein.;  Surgeon: Ivin Poot, MD;  Location: [place];  Service: Open Heart Surgery;  Laterality: N/A;  . ESOPHAGEAL DILATION  04/24/2019   Procedure: ESOPHAGEAL DILATION;  Surgeon: Rush Landmark Telford Nab., MD;  [facility];  Service: Gastroenterology;;  . ESOPHAGOGASTRODUODENOSCOPY (EGD) WITH PROPOFOL N/A 06/25/2018   Procedure: ESOPHAGOGASTRODUODENOSCOPY (EGD) WITH PROPOFOL;  Surgeon: Irving Copas., MD;  Location: [place];  Service: Gastroenterology;  Laterality:  N/A;  . ESOPHAGOGASTRODUODENOSCOPY (EGD) WITH PROPOFOL N/A 04/24/2019   Procedure: ESOPHAGOGASTRODUODENOSCOPY (EGD) WITH PROPOFOL;  Surgeon: Rush Landmark Telford Nab., MD;  [facility];  Service: Gastroenterology;  Laterality: N/A;  . FEMUR IM NAIL Right 11/16/2019   Procedure: RIGHT HIP INTRAMEDULLARY (IM) NAIL FEMORAL;  Surgeon: Meredith Pel, MD;  Location: [place];  Service: Orthopedics;  Laterality: Right;  . HEMOSTASIS CLIP PLACEMENT  04/24/2019   Procedure: HEMOSTASIS CLIP PLACEMENT;   Surgeon: Irving Copas., MD;  [facility];  Service: Gastroenterology;;  . KNEE SURGERY    . LOOP RECORDER INSERTION N/A 02/19/2018   Procedure: LOOP RECORDER INSERTION;  Surgeon: Deboraha Sprang, MD;  Location: [place] [facility];  Service: Cardiovascular;  Laterality: N/A;  . POLYPECTOMY  04/24/2019   Procedure: POLYPECTOMY;  Surgeon: Rush Landmark Telford Nab., MD;  Location[facility];  Service: Gastroenterology;;  . Lia Foyer LIFTING INJECTION  04/24/2019   Procedure: SUBMUCOSAL LIFTING INJECTION;  Surgeon: Irving Copas., MD;  [facility];  Service: Gastroenterology;;  . TEE WITHOUT CARDIOVERSION N/A 07/06/2015   Procedure: TRANSESOPHAGEAL ECHOCARDIOGRAM (TEE);  Surgeon: Ivin Poot, MD;  Location: [place];  Service: Open Heart Surgery;  Laterality: N/A;    There were no vitals filed for this visit.   Subjective Assessment - 08/19/20 1318    Subjective He relays the pain in his Rt hip just is not improving. His wife asks if she should make a follow up appointment for MD and PT agrees. She does also state that he was able to walk around the house a lot more yesterday with his RW.    Pertinent History PMH: repeated falls, pelvic fx, CVA X5, DM, CKD, CAD, CABGX4,mild dementia    Limitations Lifting;Standing;Walking;House hold activities    How long can you stand comfortably? one minute    How long can you walk comfortably? 100 ft    Diagnostic tests see chart for lumbar/pelvic MRI and XR    Patient Stated Goals get legs stronger and walk again    Currently in Pain? Yes    Pain Score 5     Pain Location Hip    Pain Orientation Right    Pain Descriptors / Indicators Aching;Sore    Pain Type Chronic pain    Pain Onset More than a month ago              [facility] PT Assessment - 08/19/20 0001      Assessment   Medical Diagnosis gait abnormaility, Spinal stenosis of lumbar region with neurogenic claudication, bilat leg weakness Rt worse than  Lt.    Referring Provider (PT) Royal Hawthorn, MD      Strength   Right Hip Flexion 4-/5    Right Hip ABduction 4-/5    Right Hip ADduction 4+/5    Left Hip Flexion 4/5    Left Hip ABduction 4+/5    Left Hip ADduction 5/5    Right Knee Flexion 4/5    Right Knee Extension 4-/5    Left Knee Flexion 4+/5    Left Knee Extension 4+/5      Standardized Balance Assessment   Five times sit to stand comments  33 sec using UE to push up from chair to RW   was 28     Timed Up and Go Test   Normal TUG (seconds) 28   was 46  [facility] PT Treatment/Exercise - 08/19/20 0001      Transfers   Transfers Sit to Stand;Stand to Sit    Sit to Stand 4: Min guard;With upper extremity assist;With armrests;From chair/3-in-1    Stand to Sit 5: Supervision;With upper extremity assist;With armrests;To chair/3-in-1;Other (comment)      Ambulation/Gait   Ambulation/Gait Yes    Ambulation/Gait Assistance 4: Min guard    Ambulation Distance (Feet) 65 Feet   max tolerated distance   Assistive device Rolling walker      Knee/Hip Exercises: Aerobic   Nustep seat 13 Level 5  X9 min (at end of session).      Manual Therapy   Manual therapy comments Rt leg long axis distraction, Rt hip PROM to tolerance into flexion, circles, IR,ER                    PT Short Term Goals - 08/19/20 1339      PT SHORT TERM GOAL #1   Title independent with initial HEP (Target for all STGs 07/10/20)    Time 4    Period Weeks    Status Achieved      PT SHORT TERM GOAL #2   Title Patient will ambulate with LRAD CGA at least 100 ft    Baseline 50 ft with RW    Time 4    Status Achieved      PT SHORT TERM GOAL #3   Title Pt will ambulate 175 feet with RW    Baseline ranges 50-100 ft    Time 4    Period Weeks    Status On-going    Target Date 08/27/20             PT Long Term Goals - 08/19/20 1340      PT LONG TERM GOAL #1   Title independent with updated HEP (Target  all LTG 12 weeks  09/04/20)    Time 12    Period Weeks    Status On-going      PT LONG TERM GOAL #2   Title improve 5TSTS test to less than 25 seconds to show improved leg strength/endurance    Baseline 29 sec on 3/3    Time 12    Period Weeks    Status On-going      PT LONG TERM GOAL #3   Title Improve bilat leg strength to at least 4+/5 MMT measured in sitting to improve function    Baseline 4- to 4    Time 12    Period Weeks    Status On-going      PT LONG TERM GOAL #4   Title Will improve TUG to less than 30 sec to show improved balance and gait speed.    Baseline 28 sec on 4/20    Time 12    Period Weeks    Status Achieved      PT LONG TERM GOAL #5   Title Ambulate with LRAD mod I  for 300 ft or stand at least 10 min    Baseline 120 ft on 3/3 with RW    Time 12    Period Weeks    Status On-going                 Plan - 08/19/20 1354    Clinical Impression Statement Recert and progress note shows slow progress thus far with PT. He has met 2/3 short term goals but only 1/5 long term goals. He has  been overall more limited over the last 2-3 weeks by Rt hip pain limiting his standing/walking tolerance. He has now made MD follow up appointment for next month so we will continue to see him until then to see if we can progress his strength and functional abilities. He did however show significant improvement today in timed up and go score showing increased overall gait speed.    Personal Factors and Comorbidities Comorbidity 3+;Time since onset of injury/illness/exacerbation;Past/Current Experience;Fitness    Comorbidities PMH: repeated falls, pelvic fx, CVA X5, DM, CKD, CAD, CABGX4,mild dementia    Examination-Activity Limitations Bathing;Bed Mobility;Bend;Carry;Lift;Stand;Toileting;Stairs;Squat;Locomotion Level    Examination-Participation Restrictions Art gallery manager;Shop    Stability/Clinical Decision Making Evolving/Moderate complexity    Rehab Potential  Fair    PT Frequency 2x / week    PT Duration 8 weeks    PT Treatment/Interventions Aquatic Therapy;Cryotherapy;Dentist;Therapeutic activities;Therapeutic exercise;Neuromuscular re-education;Manual techniques;Patient/family education;Passive range of motion;Taping    PT Next Visit Plan how was manual therapy last time? leg strength, endurance, balance, gait with RW    PT Home Exercise Plan [facility]    Consulted and Agree with Plan of Care Patient           Patient will benefit from skilled therapeutic intervention in order to improve the following deficits and impairments:  Abnormal gait,Decreased activity tolerance,Decreased balance,Decreased endurance,Decreased coordination,Decreased range of motion,Decreased strength,Difficulty walking,Postural dysfunction,Pain,Improper body mechanics  Visit Diagnosis: Chronic bilateral low back pain with right-sided sciatica  Other abnormalities of gait and mobility  Muscle weakness (generalized)  Other symptoms and signs involving the nervous system     Problem List Patient Active Problem List   Diagnosis Date Noted  . Situational depression 01/22/2020  . Urinary frequency 01/22/2020  . Bacteremia due to Staphylococcus epidermidis 11/24/2019  . Stroke (cerebrum) ([place])   . Elevated troponin   . Closed right hip fracture ([place]) 11/15/2019  . Unilateral primary osteoarthritis, right knee 11/13/2019  . At high risk for falls 05/29/2019  . Chronic bilateral low back pain without sciatica 02/07/2019  . Statin intolerance 11/16/2018  . Hyperlipidemia, mixed 08/15/2018  . Low back pain 07/18/2018  . History of lacunar cerebrovascular accident (CVA) 02/12/2018  . Dizziness 02/12/2018  . Myofascial pain 12/18/2017  . Spondylosis without myelopathy or radiculopathy, lumbar region 12/18/2017  . Lumbar radiculopathy, right 12/18/2017  . Cholelithiasis 05/17/2017  . Sinus Bradycardia  05/17/2017  . Thoracic radiculopathy 04/12/2017  . Primary osteoarthritis of right knee 12/14/2016  . DDD (degenerative disc disease), lumbar 10/10/2016  . Lumbar facet arthropathy 08/29/2016  . Idiopathic scoliosis 03/22/2016  . Diabetic neuropathy ([place]) 03/22/2016  . Dysautonomia orthostatic hypotension syndrome 02/25/2016  . CKD stage 3 due to type 2 diabetes mellitus ([place]) 02/19/2016  . Coronary artery disease involving coronary bypass graft of native heart without angina pectoris   . Diabetes mellitus type 2, diet-controlled ([place])   . 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 Medication management 08/11/2015  . 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 PVD (peripheral vascular disease) ([place]) 01/01/2014  . Hyperlipidemia associated with type 2 diabetes mellitus ([place]) 04/11/2013  . Supine hypertension   . Vitamin D deficiency   . Vitamin B 12 deficiency   . Coronary atherosclerosis- s/p PCI to LAD in 2009 and PCI to RCA in 2011 02/27/2009  . Abdominal aortic aneurysm ([place]) 02/27/2009  . GERD 02/27/2009    Silvestre Mesi 08/19/2020, 2:00 PM  [facility] Physical Therapy [address] [place], [place], [address] Phone: [phone]  Fax:  [phone]  Name: [name] MRN: [id number] Date of Birth: [birth year]

## 2020-08-24 ENCOUNTER — Ambulatory Visit (INDEPENDENT_AMBULATORY_CARE_PROVIDER_SITE_OTHER): Payer: Medicare Other

## 2020-08-24 DIAGNOSIS — I6310 Cerebral infarction due to embolism of unspecified precerebral artery: Secondary | ICD-10-CM | POA: Diagnosis not present

## 2020-08-25 ENCOUNTER — Other Ambulatory Visit: Payer: Self-pay

## 2020-08-25 ENCOUNTER — Ambulatory Visit: Payer: Medicare Other | Admitting: Physical Therapy

## 2020-08-25 DIAGNOSIS — R29818 Other symptoms and signs involving the nervous system: Secondary | ICD-10-CM

## 2020-08-25 DIAGNOSIS — M6281 Muscle weakness (generalized): Secondary | ICD-10-CM | POA: Diagnosis not present

## 2020-08-25 DIAGNOSIS — M5441 Lumbago with sciatica, right side: Secondary | ICD-10-CM | POA: Diagnosis not present

## 2020-08-25 DIAGNOSIS — G8929 Other chronic pain: Secondary | ICD-10-CM

## 2020-08-25 DIAGNOSIS — R2689 Other abnormalities of gait and mobility: Secondary | ICD-10-CM | POA: Diagnosis not present

## 2020-08-25 DIAGNOSIS — M545 Low back pain, unspecified: Secondary | ICD-10-CM

## 2020-08-25 LAB — CUP PACEART REMOTE DEVICE CHECK
Date Time Interrogation Session: 20220424041053
Implantable Pulse Generator Implant Date: 20191021

## 2020-08-25 NOTE — Therapy (Signed)
[facility] [place], [place], [address] Phone: [phone]   Fax:  [phone]  Physical Therapy Treatment  Patient Details  Name: [name] MRN: [id number] Date of Birth: 04-Apr-1934 Referring Provider (PT): Royal Hawthorn, MD   Encounter Date: 08/25/2020   PT End of Session - 08/25/20 1124    Visit Number 17    Number of Visits 25    Date for PT Re-Evaluation 09/30/20    Authorization Type UHC MCR    Progress Note Due on Visit 26    PT Start Time 1100    PT Stop Time 1140    PT Time Calculation (min) 40 min    Equipment Utilized During Treatment Gait belt    Activity Tolerance Patient limited by fatigue    Behavior During Therapy [facility] for tasks assessed/performed           Past Medical History:  Diagnosis Date  . AAA (abdominal aortic aneurysm) ([place])   . Aneurysm of iliac artery (H[facility])   . CAD (coronary artery disease)    a. s/p CABGx4 in 07/2015.  . Carotid artery disease ([place])   . Colon polyps   . Diabetes ([place])   . Difficult intubation   . 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 Esophageal reflux   . High cholesterol   . History of IBS 02/27/2009  . Hypertension    pt denies, he says he has a h/o hypotension. If BP up he adjusts the Florinef  . Jejunitis with partial SBO 05/20/2017  . Orthostatic hypotension   . Prostatitis   . Stroke ([place])   . TIA (transient ischemic attack)   . Upper GI bleed 06/24/2018  . Vitamin B 12 deficiency   . Vitamin D deficiency     Past Surgical History:  Procedure Laterality Date  . BIOPSY  06/25/2018   Procedure: BIOPSY;  Surgeon: Rush Landmark Telford Nab., MD;  Location: [facility];  Service: Gastroenterology;;  . BIOPSY  04/24/2019   Procedure: BIOPSY;  Surgeon: Irving Copas., MD;  Location[facility];  Service: Gastroenterology;;  . BLADDER SURGERY  1969   traumatic pelvic fractures, urethral and bladder repair  . CARDIAC CATHETERIZATION N/A 07/01/2015    Procedure: Left Heart Cath and Coronary Angiography;  Surgeon: Wellington Hampshire, MD;  Location: [place] [facility];  Service: Cardiovascular;  Laterality: N/A;  . COLON RESECTION N/A 05/17/2017   Procedure: DIAGNOSTIC LAPAROSCOPY,;  Surgeon: Leighton Ruff, MD;  [facility];  Service: General;  Laterality: N/A;  . COLONOSCOPY WITH PROPOFOL N/A 04/24/2019   Procedure: COLONOSCOPY WITH PROPOFOL;  Surgeon: Irving Copas., MD;  [facility];  Service: Gastroenterology;  Laterality: N/A;  . CORONARY ANGIOPLASTY WITH STENT PLACEMENT    . CORONARY ARTERY BYPASS GRAFT N/A 07/06/2015   Procedure: CORONARY ARTERY BYPASS GRAFTING (CABG)x 4   utilizing the left internal mammary artery and endoscopically harvested bilateral  sapheneous vein.;  Surgeon: Ivin Poot, MD;  Location: [place];  Service: Open Heart Surgery;  Laterality: N/A;  . ESOPHAGEAL DILATION  04/24/2019   Procedure: ESOPHAGEAL DILATION;  Surgeon: Rush Landmark Telford Nab., MD;  [facility];  Service: Gastroenterology;;  . ESOPHAGOGASTRODUODENOSCOPY (EGD) WITH PROPOFOL N/A 06/25/2018   Procedure: ESOPHAGOGASTRODUODENOSCOPY (EGD) WITH PROPOFOL;  Surgeon: Irving Copas., MD;  Location: [place];  Service: Gastroenterology;  Laterality: N/A;  . ESOPHAGOGASTRODUODENOSCOPY (EGD) WITH PROPOFOL N/A 04/24/2019   Procedure: ESOPHAGOGASTRODUODENOSCOPY (EGD) WITH PROPOFOL;  Surgeon: Rush Landmark Telford Nab., MD;  [facility];  Service: Gastroenterology;  Laterality: N/A;  . FEMUR IM NAIL Right 11/16/2019   Procedure: RIGHT HIP INTRAMEDULLARY (IM) NAIL FEMORAL;  Surgeon: Meredith Pel, MD;  Location: [place];  Service: Orthopedics;  Laterality: Right;  . HEMOSTASIS CLIP PLACEMENT  04/24/2019   Procedure: HEMOSTASIS CLIP PLACEMENT;  Surgeon: Irving Copas., MD;  [facility];  Service: Gastroenterology;;  . KNEE SURGERY    . LOOP RECORDER INSERTION N/A 02/19/2018   Procedure: LOOP  RECORDER INSERTION;  Surgeon: Deboraha Sprang, MD;  Location: [place] [facility];  Service: Cardiovascular;  Laterality: N/A;  . POLYPECTOMY  04/24/2019   Procedure: POLYPECTOMY;  Surgeon: Rush Landmark Telford Nab., MD;  Location[facility];  Service: Gastroenterology;;  . Lia Foyer LIFTING INJECTION  04/24/2019   Procedure: SUBMUCOSAL LIFTING INJECTION;  Surgeon: Irving Copas., MD;  [facility];  Service: Gastroenterology;;  . TEE WITHOUT CARDIOVERSION N/A 07/06/2015   Procedure: TRANSESOPHAGEAL ECHOCARDIOGRAM (TEE);  Surgeon: Ivin Poot, MD;  Location: [place];  Service: Open Heart Surgery;  Laterality: N/A;    There were no vitals filed for this visit.   Subjective Assessment - 08/25/20 1115    Subjective no change in his Rt hip pain. But overall doing okay.    Pertinent History PMH: repeated falls, pelvic fx, CVA X5, DM, CKD, CAD, CABGX4,mild dementia    Limitations Lifting;Standing;Walking;House hold activities    How long can you stand comfortably? one minute    How long can you walk comfortably? 100 ft    Diagnostic tests see chart for lumbar/pelvic MRI and XR    Patient Stated Goals get legs stronger and walk again    Pain Onset More than a month ago            [facility] Adult PT Treatment/Exercise - 08/25/20 0001      Transfers   Transfers Sit to Stand;Stand to Sit    Sit to Stand 4: Min guard;With upper extremity assist;With armrests;From chair/3-in-1    Stand to Sit 5: Supervision;With upper extremity assist;With armrests;To chair/3-in-1;Other (comment)      Ambulation/Gait   Ambulation/Gait Yes    Ambulation/Gait Assistance 4: Min guard    Ambulation Distance (Feet) 98 Feet   max tolerated distance, then another 50 ft after rest   Assistive device Rolling walker      Knee/Hip Exercises: Aerobic   Nustep seat 13 Level 5  X9 min (at end of session).      Knee/Hip Exercises: Standing   Hip Abduction Both;2 sets;10 reps    Abduction Limitations  with UE support, 3 lbs      Knee/Hip Exercises: Seated   Long Arc Quad Both;2 sets;15 reps    Long Arc Quad Weight 4 lbs.    Clamshell with TheraBand Green   2X20 reps   Sit to Sand 5 reps;with UE support;2 sets      Manual Therapy   Manual therapy comments Rt leg long axis distraction, Rt hip PROM to tolerance into flexion, circles, IR,ER                    PT Short Term Goals - 08/19/20 1339      PT SHORT TERM GOAL #1   Title independent with initial HEP (Target for all STGs 07/10/20)    Time 4    Period Weeks    Status Achieved      PT SHORT TERM GOAL #2   Title Patient will ambulate with LRAD CGA at least  100 ft    Baseline 50 ft with RW    Time 4    Status Achieved      PT SHORT TERM GOAL #3   Title Pt will ambulate 175 feet with RW    Baseline ranges 50-100 ft    Time 4    Period Weeks    Status On-going    Target Date 08/27/20             PT Long Term Goals - 08/19/20 1340      PT LONG TERM GOAL #1   Title independent with updated HEP (Target all LTG 12 weeks  09/04/20)    Time 12    Period Weeks    Status On-going      PT LONG TERM GOAL #2   Title improve 5TSTS test to less than 25 seconds to show improved leg strength/endurance    Baseline 29 sec on 3/3    Time 12    Period Weeks    Status On-going      PT LONG TERM GOAL #3   Title Improve bilat leg strength to at least 4+/5 MMT measured in sitting to improve function    Baseline 4- to 4    Time 12    Period Weeks    Status On-going      PT LONG TERM GOAL #4   Title Will improve TUG to less than 30 sec to show improved balance and gait speed.    Baseline 28 sec on 4/20    Time 12    Period Weeks    Status Achieved      PT LONG TERM GOAL #5   Title Ambulate with LRAD mod I  for 300 ft or stand at least 10 min    Baseline 120 ft on 3/3 with RW    Time 12    Period Weeks    Status On-going                 Plan - 08/25/20 1131    Clinical Impression Statement He did  show some improvment in max tolerated distance he could walk with RW today. He shows good effort and does what is asked in PT but still continues to be limited by poor endurance, Rt hip pain, and overall leg weakness that we are trying to progress as much as he can tolerate.    Personal Factors and Comorbidities Comorbidity 3+;Time since onset of injury/illness/exacerbation;Past/Current Experience;Fitness    Comorbidities PMH: repeated falls, pelvic fx, CVA X5, DM, CKD, CAD, CABGX4,mild dementia    Examination-Activity Limitations Bathing;Bed Mobility;Bend;Carry;Lift;Stand;Toileting;Stairs;Squat;Locomotion Level    Examination-Participation Restrictions Art gallery manager;Shop    Stability/Clinical Decision Making Evolving/Moderate complexity    Rehab Potential Fair    PT Frequency 2x / week    PT Duration 8 weeks    PT Treatment/Interventions Aquatic Therapy;Cryotherapy;Dentist;Therapeutic activities;Therapeutic exercise;Neuromuscular re-education;Manual techniques;Patient/family education;Passive range of motion;Taping    PT Next Visit Plan how was manual therapy last time? leg strength, endurance, balance, gait with RW    PT Home Exercise Plan [facility]    Consulted and Agree with Plan of Care Patient           Patient will benefit from skilled therapeutic intervention in order to improve the following deficits and impairments:  Abnormal gait,Decreased activity tolerance,Decreased balance,Decreased endurance,Decreased coordination,Decreased range of motion,Decreased strength,Difficulty walking,Postural dysfunction,Pain,Improper body mechanics  Visit Diagnosis: Chronic bilateral low back pain with right-sided sciatica  Other abnormalities of gait  and mobility  Muscle weakness (generalized)  Other symptoms and signs involving the nervous system  Chronic bilateral low back pain, unspecified whether sciatica  present     Problem List Patient Active Problem List   Diagnosis Date Noted  . Situational depression 01/22/2020  . Urinary frequency 01/22/2020  . Bacteremia due to Staphylococcus epidermidis 11/24/2019  . Stroke (cerebrum) ([place])   . Elevated troponin   . Closed right hip fracture ([place]) 11/15/2019  . Unilateral primary osteoarthritis, right knee 11/13/2019  . At high risk for falls 05/29/2019  . Chronic bilateral low back pain without sciatica 02/07/2019  . Statin intolerance 11/16/2018  . Hyperlipidemia, mixed 08/15/2018  . Low back pain 07/18/2018  . History of lacunar cerebrovascular accident (CVA) 02/12/2018  . Dizziness 02/12/2018  . Myofascial pain 12/18/2017  . Spondylosis without myelopathy or radiculopathy, lumbar region 12/18/2017  . Lumbar radiculopathy, right 12/18/2017  . Cholelithiasis 05/17/2017  . Sinus Bradycardia 05/17/2017  . Thoracic radiculopathy 04/12/2017  . Primary osteoarthritis of right knee 12/14/2016  . DDD (degenerative disc disease), lumbar 10/10/2016  . Lumbar facet arthropathy 08/29/2016  . Idiopathic scoliosis 03/22/2016  . Diabetic neuropathy ([place]) 03/22/2016  . Dysautonomia orthostatic hypotension syndrome 02/25/2016  . CKD stage 3 due to type 2 diabetes mellitus ([place]) 02/19/2016  . Coronary artery disease involving coronary bypass graft of native heart without angina pectoris   . Diabetes mellitus type 2, diet-controlled ([place])   . 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 Medication management 08/11/2015  . 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 PVD (peripheral vascular disease) ([place]) 01/01/2014  . Hyperlipidemia associated with type 2 diabetes mellitus ([place]) 04/11/2013  . Supine hypertension   . Vitamin D deficiency   . Vitamin B 12 deficiency   . Coronary atherosclerosis- s/p PCI to LAD in 2009 and PCI to RCA in 2011 02/27/2009  . Abdominal aortic aneurysm ([place]) 02/27/2009  . GERD 02/27/2009    Silvestre Mesi 08/25/2020, 11:35 AM  [facility] Physical Therapy [address] [place], [place], [address] Phone: [phone]   Fax:  [phone]  Name: [name] MRN: [id number] Date of Birth: 06-23-1933

## 2020-08-27 ENCOUNTER — Ambulatory Visit: Payer: Medicare Other | Admitting: Physical Therapy

## 2020-08-27 ENCOUNTER — Other Ambulatory Visit: Payer: Self-pay

## 2020-08-27 DIAGNOSIS — R2689 Other abnormalities of gait and mobility: Secondary | ICD-10-CM

## 2020-08-27 DIAGNOSIS — M5441 Lumbago with sciatica, right side: Secondary | ICD-10-CM

## 2020-08-27 DIAGNOSIS — G8929 Other chronic pain: Secondary | ICD-10-CM

## 2020-08-27 DIAGNOSIS — M6281 Muscle weakness (generalized): Secondary | ICD-10-CM | POA: Diagnosis not present

## 2020-08-27 DIAGNOSIS — R29818 Other symptoms and signs involving the nervous system: Secondary | ICD-10-CM

## 2020-08-27 NOTE — Therapy (Signed)
[facility] [address] [place], [place], [address] Phone: [phone]   Fax:  [phone]  Physical Therapy Treatment  Patient Details  Name: [name] MRN: [id number] Date of Birth: 1933/09/15 Referring Provider (PT): Royal Hawthorn, MD   Encounter Date: 08/27/2020   PT End of Session - 08/27/20 1456    Visit Number 18    Number of Visits 25    Date for PT Re-Evaluation 09/30/20    Authorization Type UHC MCR    Progress Note Due on Visit 26    PT Start Time 1430    PT Stop Time 1512    PT Time Calculation (min) 42 min    Equipment Utilized During Treatment Gait belt    Activity Tolerance Patient limited by fatigue    Behavior During Therapy [facility] for tasks assessed/performed           Past Medical History:  Diagnosis Date  . AAA (abdominal aortic aneurysm) ([place])   . Aneurysm of iliac artery (H[facility])   . CAD (coronary artery disease)    a. s/p CABGx4 in 07/2015.  . Carotid artery disease ([place])   . Colon polyps   . Diabetes ([place])   . Difficult intubation   . 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 Esophageal reflux   . High cholesterol   . History of IBS 02/27/2009  . Hypertension    pt denies, he says he has a h/o hypotension. If BP up he adjusts the Florinef  . Jejunitis with partial SBO 05/20/2017  . Orthostatic hypotension   . Prostatitis   . Stroke ([place])   . TIA (transient ischemic attack)   . Upper GI bleed 06/24/2018  . Vitamin B 12 deficiency   . Vitamin D deficiency     Past Surgical History:  Procedure Laterality Date  . BIOPSY  06/25/2018   Procedure: BIOPSY;  Surgeon: Rush Landmark Telford Nab., MD;  Location: [place];  Service: Gastroenterology;;  . BIOPSY  04/24/2019   Procedure: BIOPSY;  Surgeon: Irving Copas., MD;  Location: [facility];  Service: Gastroenterology;;  . BLADDER SURGERY  1969   traumatic pelvic fractures, urethral and bladder repair  . CARDIAC CATHETERIZATION N/A 07/01/2015    Procedure: Left Heart Cath and Coronary Angiography;  Surgeon: Wellington Hampshire, MD;  Location: [place] [facility];  Service: Cardiovascular;  Laterality: N/A;  . COLON RESECTION N/A 05/17/2017   Procedure: DIAGNOSTIC LAPAROSCOPY,;  Surgeon: Leighton Ruff, MD;  [facility];  Service: General;  Laterality: N/A;  . COLONOSCOPY WITH PROPOFOL N/A 04/24/2019   Procedure: COLONOSCOPY WITH PROPOFOL;  Surgeon: Irving Copas., MD;  [facility];  Service: Gastroenterology;  Laterality: N/A;  . CORONARY ANGIOPLASTY WITH STENT PLACEMENT    . CORONARY ARTERY BYPASS GRAFT N/A 07/06/2015   Procedure: CORONARY ARTERY BYPASS GRAFTING (CABG)x 4   utilizing the left internal mammary artery and endoscopically harvested bilateral  sapheneous vein.;  Surgeon: Ivin Poot, MD;  Location: [place];  Service: Open Heart Surgery;  Laterality: N/A;  . ESOPHAGEAL DILATION  04/24/2019   Procedure: ESOPHAGEAL DILATION;  Surgeon: Rush Landmark Telford Nab., MD;  [facility];  Service: Gastroenterology;;  . ESOPHAGOGASTRODUODENOSCOPY (EGD) WITH PROPOFOL N/A 06/25/2018   Procedure: ESOPHAGOGASTRODUODENOSCOPY (EGD) WITH PROPOFOL;  Surgeon: Irving Copas., MD;  Location: [place];  Service: Gastroenterology;  Laterality: N/A;  . ESOPHAGOGASTRODUODENOSCOPY (EGD) WITH PROPOFOL N/A 04/24/2019   Procedure: ESOPHAGOGASTRODUODENOSCOPY (EGD) WITH PROPOFOL;  Surgeon: Rush Landmark Telford Nab., MD;  [facility];  Service: Gastroenterology;  Laterality: N/A;  . FEMUR IM NAIL Right 11/16/2019   Procedure: RIGHT HIP INTRAMEDULLARY (IM) NAIL FEMORAL;  Surgeon: Meredith Pel, MD;  Location: [place];  Service: Orthopedics;  Laterality: Right;  . HEMOSTASIS CLIP PLACEMENT  04/24/2019   Procedure: HEMOSTASIS CLIP PLACEMENT;  Surgeon: Irving Copas., MD;  [facility];  Service: Gastroenterology;;  . KNEE SURGERY    . LOOP RECORDER INSERTION N/A 02/19/2018   Procedure: LOOP  RECORDER INSERTION;  Surgeon: Deboraha Sprang, MD;  Location: [place] [facility];  Service: Cardiovascular;  Laterality: N/A;  . POLYPECTOMY  04/24/2019   Procedure: POLYPECTOMY;  Surgeon: Rush Landmark Telford Nab., MD;  [facility];  Service: Gastroenterology;;  . Lia Foyer LIFTING INJECTION  04/24/2019   Procedure: SUBMUCOSAL LIFTING INJECTION;  Surgeon: Irving Copas., MD;  [facility];  Service: Gastroenterology;;  . TEE WITHOUT CARDIOVERSION N/A 07/06/2015   Procedure: TRANSESOPHAGEAL ECHOCARDIOGRAM (TEE);  Surgeon: Ivin Poot, MD;  Location: [place];  Service: Open Heart Surgery;  Laterality: N/A;    There were no vitals filed for this visit.   Subjective Assessment - 08/27/20 1509    Subjective no subjective improvments in his Rt hip but his wife does relay he has been able to walk more at home.    Pertinent History PMH: repeated falls, pelvic fx, CVA X5, DM, CKD, CAD, CABGX4,mild dementia    Limitations Lifting;Standing;Walking;House hold activities    How long can you stand comfortably? one minute    How long can you walk comfortably? 100 ft    Diagnostic tests see chart for lumbar/pelvic MRI and XR    Patient Stated Goals get legs stronger and walk again    Pain Onset More than a month ago                             [facility] Adult PT Treatment/Exercise - 08/27/20 0001      Transfers   Transfers Sit to Stand;Stand to Sit    Sit to Stand 4: Min guard;With upper extremity assist;With armrests;From chair/3-in-1    Stand to Sit 5: Supervision;With upper extremity assist;With armrests;To chair/3-in-1;Other (comment)      Ambulation/Gait   Ambulation/Gait Yes    Ambulation/Gait Assistance 4: Min guard    Ambulation Distance (Feet) 110 Feet   max tolerated distance, then 25 ftX2   Assistive device Rolling walker      Knee/Hip Exercises: Aerobic   Nustep seat 13 Level 5  X7 min (at end of session).      Knee/Hip Exercises:  Standing   Hip Flexion Both;Knee bent;2 sets;10 reps;Stengthening    Hip Flexion Limitations with bilat UE support and 3#    Hip Abduction Both;2 sets;10 reps    Abduction Limitations with UE support, 3 lbs      Knee/Hip Exercises: Seated   Long Arc Quad Both;2 sets;20 reps    Long Arc Quad Weight 3 lbs.    Sit to Sand 5 reps;with UE support;2 sets                    PT Short Term Goals - 08/19/20 1339      PT SHORT TERM GOAL #1   Title independent with initial HEP (Target for all STGs 07/10/20)    Time 4    Period Weeks    Status Achieved      PT SHORT TERM GOAL #2  Title Patient will ambulate with LRAD CGA at least 100 ft    Baseline 50 ft with RW    Time 4    Status Achieved      PT SHORT TERM GOAL #3   Title Pt will ambulate 175 feet with RW    Baseline ranges 50-100 ft    Time 4    Period Weeks    Status On-going    Target Date 08/27/20             PT Long Term Goals - 08/19/20 1340      PT LONG TERM GOAL #1   Title independent with updated HEP (Target all LTG 12 weeks  09/04/20)    Time 12    Period Weeks    Status On-going      PT LONG TERM GOAL #2   Title improve 5TSTS test to less than 25 seconds to show improved leg strength/endurance    Baseline 29 sec on 3/3    Time 12    Period Weeks    Status On-going      PT LONG TERM GOAL #3   Title Improve bilat leg strength to at least 4+/5 MMT measured in sitting to improve function    Baseline 4- to 4    Time 12    Period Weeks    Status On-going      PT LONG TERM GOAL #4   Title Will improve TUG to less than 30 sec to show improved balance and gait speed.    Baseline 28 sec on 4/20    Time 12    Period Weeks    Status Achieved      PT LONG TERM GOAL #5   Title Ambulate with LRAD mod I  for 300 ft or stand at least 10 min    Baseline 120 ft on 3/3 with RW    Time 12    Period Weeks    Status On-going                 Plan - 08/27/20 1517    Clinical Impression Statement  Even though he reports no subjective improvements in his overall Rt hip pain, he is at least showing objective functional improvments in standing tolerance and ambulation tolerance.    Personal Factors and Comorbidities Comorbidity 3+;Time since onset of injury/illness/exacerbation;Past/Current Experience;Fitness    Comorbidities PMH: repeated falls, pelvic fx, CVA X5, DM, CKD, CAD, CABGX4,mild dementia    Examination-Activity Limitations Bathing;Bed Mobility;Bend;Carry;Lift;Stand;Toileting;Stairs;Squat;Locomotion Level    Examination-Participation Restrictions Art gallery manager;Shop    Stability/Clinical Decision Making Evolving/Moderate complexity    Rehab Potential Fair    PT Frequency 2x / week    PT Duration 8 weeks    PT Treatment/Interventions Aquatic Therapy;Cryotherapy;Dentist;Therapeutic activities;Therapeutic exercise;Neuromuscular re-education;Manual techniques;Patient/family education;Passive range of motion;Taping    PT Next Visit Plan leg strength, endurance, balance, gait with RW    PT Home Exercise Plan [facility]    Consulted and Agree with Plan of Care Patient           Patient will benefit from skilled therapeutic intervention in order to improve the following deficits and impairments:  Abnormal gait,Decreased activity tolerance,Decreased balance,Decreased endurance,Decreased coordination,Decreased range of motion,Decreased strength,Difficulty walking,Postural dysfunction,Pain,Improper body mechanics  Visit Diagnosis: Chronic bilateral low back pain with right-sided sciatica  Other abnormalities of gait and mobility  Muscle weakness (generalized)  Other symptoms and signs involving the nervous system     Problem List Patient Active Problem  List   Diagnosis Date Noted  . Situational depression 01/22/2020  . Urinary frequency 01/22/2020  . Bacteremia due to Staphylococcus epidermidis 11/24/2019  .  Stroke (cerebrum) ([place])   . Elevated troponin   . Closed right hip fracture ([place]) 11/15/2019  . Unilateral primary osteoarthritis, right knee 11/13/2019  . At high risk for falls 05/29/2019  . Chronic bilateral low back pain without sciatica 02/07/2019  . Statin intolerance 11/16/2018  . Hyperlipidemia, mixed 08/15/2018  . Low back pain 07/18/2018  . History of lacunar cerebrovascular accident (CVA) 02/12/2018  . Dizziness 02/12/2018  . Myofascial pain 12/18/2017  . Spondylosis without myelopathy or radiculopathy, lumbar region 12/18/2017  . Lumbar radiculopathy, right 12/18/2017  . Cholelithiasis 05/17/2017  . Sinus Bradycardia 05/17/2017  . Thoracic radiculopathy 04/12/2017  . Primary osteoarthritis of right knee 12/14/2016  . DDD (degenerative disc disease), lumbar 10/10/2016  . Lumbar facet arthropathy 08/29/2016  . Idiopathic scoliosis 03/22/2016  . Diabetic neuropathy ([place]) 03/22/2016  . Dysautonomia orthostatic hypotension syndrome 02/25/2016  . CKD stage 3 due to type 2 diabetes mellitus ([place]) 02/19/2016  . Coronary artery disease involving coronary bypass graft of native heart without angina pectoris   . Diabetes mellitus type 2, diet-controlled ([place])   . 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 Medication management 08/11/2015  . 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 PVD (peripheral vascular disease) ([place]) 01/01/2014  . Hyperlipidemia associated with type 2 diabetes mellitus ([place]) 04/11/2013  . Supine hypertension   . Vitamin D deficiency   . Vitamin B 12 deficiency   . Coronary atherosclerosis- s/p PCI to LAD in 2009 and PCI to RCA in 2011 02/27/2009  . Abdominal aortic aneurysm ([place]) 02/27/2009  . GERD 02/27/2009    Silvestre Mesi 08/27/2020, 3:18 PM  [facility] Physical Therapy [address] [place], [place], [address] Phone: [phone]   Fax:  [phone]  Name: [name] MRN: [id number] Date of Birth: 1933/11/29

## 2020-09-01 ENCOUNTER — Ambulatory Visit (INDEPENDENT_AMBULATORY_CARE_PROVIDER_SITE_OTHER): Payer: Medicare Other | Admitting: Physical Therapy

## 2020-09-01 ENCOUNTER — Other Ambulatory Visit: Payer: Self-pay

## 2020-09-01 DIAGNOSIS — R29818 Other symptoms and signs involving the nervous system: Secondary | ICD-10-CM | POA: Diagnosis not present

## 2020-09-01 DIAGNOSIS — R2689 Other abnormalities of gait and mobility: Secondary | ICD-10-CM

## 2020-09-01 DIAGNOSIS — M6281 Muscle weakness (generalized): Secondary | ICD-10-CM | POA: Diagnosis not present

## 2020-09-01 DIAGNOSIS — M5441 Lumbago with sciatica, right side: Secondary | ICD-10-CM | POA: Diagnosis not present

## 2020-09-01 DIAGNOSIS — M545 Low back pain, unspecified: Secondary | ICD-10-CM

## 2020-09-01 DIAGNOSIS — G8929 Other chronic pain: Secondary | ICD-10-CM

## 2020-09-01 NOTE — Therapy (Signed)
[facility] Physical Therapy [address] [place], [place], [address] Phone: [phone]   Fax:  [phone]  Physical Therapy Treatment  Patient Details  Name: [name] MRN: [id number] Date of Birth: 11/22/1933 Referring Provider (PT): Royal Hawthorn, MD   Encounter Date: 09/01/2020   PT End of Session - 09/01/20 1139    Visit Number 19    Number of Visits 25    Date for PT Re-Evaluation 09/30/20    Authorization Type UHC MCR    Progress Note Due on Visit 26    PT Start Time 1100    PT Stop Time 1145    PT Time Calculation (min) 45 min    Equipment Utilized During Treatment Gait belt    Activity Tolerance Patient limited by fatigue    Behavior During Therapy [facility] for tasks assessed/performed           Past Medical History:  Diagnosis Date  . AAA (abdominal aortic aneurysm) ([place])   . Aneurysm of iliac artery (HCC)   . CAD (coronary artery disease)    a. s/p CABGx4 in 07/2015.  . Carotid artery disease ([place])   . Colon polyps   . Diabetes ([place])   . Difficult intubation   . 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 Esophageal reflux   . High cholesterol   . History of IBS 02/27/2009  . Hypertension    pt denies, he says he has a h/o hypotension. If BP up he adjusts the Florinef  . Jejunitis with partial SBO 05/20/2017  . Orthostatic hypotension   . Prostatitis   . Stroke ([place])   . TIA (transient ischemic attack)   . Upper GI bleed 06/24/2018  . Vitamin B 12 deficiency   . Vitamin D deficiency     Past Surgical History:  Procedure Laterality Date  . BIOPSY  06/25/2018   Procedure: BIOPSY;  Surgeon: Rush Landmark Telford Nab., MD;  Location: [place];  Service: Gastroenterology;;  . BIOPSY  04/24/2019   Procedure: BIOPSY;  Surgeon: Irving Copas., MD;  Location[facility];  Service: Gastroenterology;;  . BLADDER SURGERY  1969   traumatic pelvic fractures, urethral and bladder repair  . CARDIAC CATHETERIZATION N/A 07/01/2015    Procedure: Left Heart Cath and Coronary Angiography;  Surgeon: Wellington Hampshire, MD;  Location: [place] [facility];  Service: Cardiovascular;  Laterality: N/A;  . COLON RESECTION N/A 05/17/2017   Procedure: DIAGNOSTIC LAPAROSCOPY,;  Surgeon: Leighton Ruff, MD;  [facility];  Service: General;  Laterality: N/A;  . COLONOSCOPY WITH PROPOFOL N/A 04/24/2019   Procedure: COLONOSCOPY WITH PROPOFOL;  Surgeon: Irving Copas., MD;  [facility];  Service: Gastroenterology;  Laterality: N/A;  . CORONARY ANGIOPLASTY WITH STENT PLACEMENT    . CORONARY ARTERY BYPASS GRAFT N/A 07/06/2015   Procedure: CORONARY ARTERY BYPASS GRAFTING (CABG)x 4   utilizing the left internal mammary artery and endoscopically harvested bilateral  sapheneous vein.;  Surgeon: Ivin Poot, MD;  Location: [place];  Service: Open Heart Surgery;  Laterality: N/A;  . ESOPHAGEAL DILATION  04/24/2019   Procedure: ESOPHAGEAL DILATION;  Surgeon: Rush Landmark Telford Nab., MD;  [facility];  Service: Gastroenterology;;  . ESOPHAGOGASTRODUODENOSCOPY (EGD) WITH PROPOFOL N/A 06/25/2018   Procedure: ESOPHAGOGASTRODUODENOSCOPY (EGD) WITH PROPOFOL;  Surgeon: Irving Copas., MD;  Location: [place];  Service: Gastroenterology;  Laterality: N/A;  . ESOPHAGOGASTRODUODENOSCOPY (EGD) WITH PROPOFOL N/A 04/24/2019   Procedure: ESOPHAGOGASTRODUODENOSCOPY (EGD) WITH PROPOFOL;  Surgeon: Rush Landmark Telford Nab., MD;  [facility];  Service: Gastroenterology;  Laterality: N/A;  . FEMUR IM NAIL Right 11/16/2019   Procedure: RIGHT HIP INTRAMEDULLARY (IM) NAIL FEMORAL;  Surgeon: Meredith Pel, MD;  Location: [place];  Service: Orthopedics;  Laterality: Right;  . HEMOSTASIS CLIP PLACEMENT  04/24/2019   Procedure: HEMOSTASIS CLIP PLACEMENT;  Surgeon: Irving Copas., MD;  [facility];  Service: Gastroenterology;;  . KNEE SURGERY    . LOOP RECORDER INSERTION N/A 02/19/2018   Procedure: LOOP  RECORDER INSERTION;  Surgeon: Deboraha Sprang, MD;  Location: [place] [facility];  Service: Cardiovascular;  Laterality: N/A;  . POLYPECTOMY  04/24/2019   Procedure: POLYPECTOMY;  Surgeon: Rush Landmark Telford Nab., MD;  Location[facility];  Service: Gastroenterology;;  . Lia Foyer LIFTING INJECTION  04/24/2019   Procedure: SUBMUCOSAL LIFTING INJECTION;  Surgeon: Irving Copas., MD;  [facility];  Service: Gastroenterology;;  . TEE WITHOUT CARDIOVERSION N/A 07/06/2015   Procedure: TRANSESOPHAGEAL ECHOCARDIOGRAM (TEE);  Surgeon: Ivin Poot, MD;  Location: [place];  Service: Open Heart Surgery;  Laterality: N/A;    There were no vitals filed for this visit.   Subjective Assessment - 09/01/20 1126    Subjective he relays his Rt hip does not feel any better but it is not any worse either.    Pertinent History PMH: repeated falls, pelvic fx, CVA X5, DM, CKD, CAD, CABGX4,mild dementia    Limitations Lifting;Standing;Walking;House hold activities    How long can you stand comfortably? 5 minutes    How long can you walk comfortably? 100 ft    Diagnostic tests see chart for lumbar/pelvic MRI and XR    Patient Stated Goals get legs stronger and walk again    Pain Onset More than a month ago            [facility] Adult PT Treatment/Exercise - 09/01/20 0001      Transfers   Transfers Sit to Stand;Stand to Sit    Sit to Stand 4: Min guard;With upper extremity assist;With armrests;From chair/3-in-1    Stand to Sit 5: Supervision;With upper extremity assist;With armrests;To chair/3-in-1;Other (comment)      Ambulation/Gait   Ambulation/Gait Yes    Ambulation/Gait Assistance 4: Min guard    Ambulation Distance (Feet) 110 Feet   max toleated distance 110 x2, then 25 ftX2   Assistive device Rolling walker      Knee/Hip Exercises: Aerobic   Nustep seat 13 Level 5  X8 min (at end of session).      Knee/Hip Exercises: Standing   Hip Flexion Both;Knee bent;2 sets;10  reps;Stengthening    Hip Flexion Limitations with bilat UE support and 3#    Hip Abduction Both;2 sets;15 reps    Abduction Limitations with UE support, 3 lbs      Knee/Hip Exercises: Seated   Long Arc Quad Both;2 sets;20 reps    Long Arc Quad Weight 3 lbs.    Sit to Sand with UE support;2 sets;10 reps   from 24 inch bar stool                   PT Short Term Goals - 08/19/20 1339      PT SHORT TERM GOAL #1   Title independent with initial HEP (Target for all STGs 07/10/20)    Time 4    Period Weeks    Status Achieved      PT SHORT TERM GOAL #2   Title Patient will ambulate with LRAD CGA at least 100 ft  Baseline 50 ft with RW    Time 4    Status Achieved      PT SHORT TERM GOAL #3   Title Pt will ambulate 175 feet with RW    Baseline ranges 50-100 ft    Time 4    Period Weeks    Status On-going    Target Date 08/27/20             PT Long Term Goals - 08/19/20 1340      PT LONG TERM GOAL #1   Title independent with updated HEP (Target all LTG 12 weeks  09/04/20)    Time 12    Period Weeks    Status On-going      PT LONG TERM GOAL #2   Title improve 5TSTS test to less than 25 seconds to show improved leg strength/endurance    Baseline 29 sec on 3/3    Time 12    Period Weeks    Status On-going      PT LONG TERM GOAL #3   Title Improve bilat leg strength to at least 4+/5 MMT measured in sitting to improve function    Baseline 4- to 4    Time 12    Period Weeks    Status On-going      PT LONG TERM GOAL #4   Title Will improve TUG to less than 30 sec to show improved balance and gait speed.    Baseline 28 sec on 4/20    Time 12    Period Weeks    Status Achieved      PT LONG TERM GOAL #5   Title Ambulate with LRAD mod I  for 300 ft or stand at least 10 min    Baseline 120 ft on 3/3 with RW    Time 12    Period Weeks    Status On-going                 Plan - 09/01/20 1142    Clinical Impression Statement He has been able to show  some slow improvements in standing/walking tolerance however is still limited in these areas by Rt hip pain and Rt leg weakness. He willl follow back up with MD 5/19.    Personal Factors and Comorbidities Comorbidity 3+;Time since onset of injury/illness/exacerbation;Past/Current Experience;Fitness    Comorbidities PMH: repeated falls, pelvic fx, CVA X5, DM, CKD, CAD, CABGX4,mild dementia    Examination-Activity Limitations Bathing;Bed Mobility;Bend;Carry;Lift;Stand;Toileting;Stairs;Squat;Locomotion Level    Examination-Participation Restrictions Art gallery manager;Shop    Stability/Clinical Decision Making Evolving/Moderate complexity    Rehab Potential Fair    PT Frequency 2x / week    PT Duration 8 weeks    PT Treatment/Interventions Aquatic Therapy;Cryotherapy;Dentist;Therapeutic activities;Therapeutic exercise;Neuromuscular re-education;Manual techniques;Patient/family education;Passive range of motion;Taping    PT Next Visit Plan leg strength, endurance, balance, gait with RW    PT Home Exercise Plan [facility]    Consulted and Agree with Plan of Care Patient           Patient will benefit from skilled therapeutic intervention in order to improve the following deficits and impairments:  Abnormal gait,Decreased activity tolerance,Decreased balance,Decreased endurance,Decreased coordination,Decreased range of motion,Decreased strength,Difficulty walking,Postural dysfunction,Pain,Improper body mechanics  Visit Diagnosis: Chronic bilateral low back pain with right-sided sciatica  Other abnormalities of gait and mobility  Muscle weakness (generalized)  Other symptoms and signs involving the nervous system  Chronic bilateral low back pain, unspecified whether sciatica present     Problem  List Patient Active Problem List   Diagnosis Date Noted  . Situational depression 01/22/2020  . Urinary frequency 01/22/2020  .  Bacteremia due to Staphylococcus epidermidis 11/24/2019  . Stroke (cerebrum) ([place])   . Elevated troponin   . Closed right hip fracture ([place]) 11/15/2019  . Unilateral primary osteoarthritis, right knee 11/13/2019  . At high risk for falls 05/29/2019  . Chronic bilateral low back pain without sciatica 02/07/2019  . Statin intolerance 11/16/2018  . Hyperlipidemia, mixed 08/15/2018  . Low back pain 07/18/2018  . History of lacunar cerebrovascular accident (CVA) 02/12/2018  . Dizziness 02/12/2018  . Myofascial pain 12/18/2017  . Spondylosis without myelopathy or radiculopathy, lumbar region 12/18/2017  . Lumbar radiculopathy, right 12/18/2017  . Cholelithiasis 05/17/2017  . Sinus Bradycardia 05/17/2017  . Thoracic radiculopathy 04/12/2017  . Primary osteoarthritis of right knee 12/14/2016  . DDD (degenerative disc disease), lumbar 10/10/2016  . Lumbar facet arthropathy 08/29/2016  . Idiopathic scoliosis 03/22/2016  . Diabetic neuropathy ([place]) 03/22/2016  . Dysautonomia orthostatic hypotension syndrome 02/25/2016  . CKD stage 3 due to type 2 diabetes mellitus ([place]) 02/19/2016  . Coronary artery disease involving coronary bypass graft of native heart without angina pectoris   . Diabetes mellitus type 2, diet-controlled ([place])   . 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 Medication management 08/11/2015  . 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 PVD (peripheral vascular disease) ([place]) 01/01/2014  . Hyperlipidemia associated with type 2 diabetes mellitus ([place]) 04/11/2013  . Supine hypertension   . Vitamin D deficiency   . Vitamin B 12 deficiency   . Coronary atherosclerosis- s/p PCI to LAD in 2009 and PCI to RCA in 2011 02/27/2009  . Abdominal aortic aneurysm ([place]) 02/27/2009  . GERD 02/27/2009    Silvestre Mesi 09/01/2020, 11:43 AM  [facility] Physical Therapy [address] [place], [place], [address] Phone: [phone]   Fax:  [phone]  Name: ELMER FICHTEL MRN:  [id number] Date of Birth: 1933-09-20

## 2020-09-03 ENCOUNTER — Other Ambulatory Visit: Payer: Self-pay

## 2020-09-03 ENCOUNTER — Encounter: Payer: Self-pay | Admitting: Physical Therapy

## 2020-09-03 ENCOUNTER — Ambulatory Visit: Payer: Medicare Other | Admitting: Physical Therapy

## 2020-09-03 DIAGNOSIS — M5441 Lumbago with sciatica, right side: Secondary | ICD-10-CM | POA: Diagnosis not present

## 2020-09-03 DIAGNOSIS — R2689 Other abnormalities of gait and mobility: Secondary | ICD-10-CM

## 2020-09-03 DIAGNOSIS — R29818 Other symptoms and signs involving the nervous system: Secondary | ICD-10-CM

## 2020-09-03 DIAGNOSIS — M6281 Muscle weakness (generalized): Secondary | ICD-10-CM

## 2020-09-03 DIAGNOSIS — G8929 Other chronic pain: Secondary | ICD-10-CM

## 2020-09-03 DIAGNOSIS — M545 Low back pain, unspecified: Secondary | ICD-10-CM

## 2020-09-03 NOTE — Therapy (Signed)
[facility] Physical Therapy [address] [place], [place], [address] Phone: [phone]   Fax:  [phone]  Physical Therapy Treatment  Patient Details  Name: [name] MRN: [id number] Date of Birth: [birth year] Referring Provider (PT): Royal Hawthorn, MD   Encounter Date: 09/03/2020   PT End of Session - 09/03/20 1422    Visit Number 20    Number of Visits 25    Date for PT Re-Evaluation 09/30/20    Authorization Type UHC MCR    Progress Note Due on Visit 26    PT Start Time 1300    PT Stop Time 1340    PT Time Calculation (min) 40 min    Equipment Utilized During Treatment Gait belt    Activity Tolerance Patient limited by fatigue    Behavior During Therapy [facility] for tasks assessed/performed           Past Medical History:  Diagnosis Date  . AAA (abdominal aortic aneurysm) ([place])   . Aneurysm of iliac artery (HCC)   . CAD (coronary artery disease)    a. s/p CABGx4 in 07/2015.  . Carotid artery disease ([place])   . Colon polyps   . Diabetes ([place])   . Difficult intubation   . 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 Esophageal reflux   . High cholesterol   . History of IBS 02/27/2009  . Hypertension    pt denies, he says he has a h/o hypotension. If BP up he adjusts the Florinef  . Jejunitis with partial SBO 05/20/2017  . Orthostatic hypotension   . Prostatitis   . Stroke ([place])   . TIA (transient ischemic attack)   . Upper GI bleed 06/24/2018  . Vitamin B 12 deficiency   . Vitamin D deficiency     Past Surgical History:  Procedure Laterality Date  . BIOPSY  06/25/2018   Procedure: BIOPSY;  Surgeon: Rush Landmark Telford Nab., MD;  Location: [place];  Service: Gastroenterology;;  . BIOPSY  04/24/2019   Procedure: BIOPSY;  Surgeon: Irving Copas., MD;  Location: [facility];  Service: Gastroenterology;;  . BLADDER SURGERY  1969   traumatic pelvic fractures, urethral and bladder repair  . CARDIAC CATHETERIZATION N/A 07/01/2015    Procedure: Left Heart Cath and Coronary Angiography;  Surgeon: Wellington Hampshire, MD;  Location: [place] [facility];  Service: Cardiovascular;  Laterality: N/A;  . COLON RESECTION N/A 05/17/2017   Procedure: DIAGNOSTIC LAPAROSCOPY,;  Surgeon: Leighton Ruff, MD;  [facility];  Service: General;  Laterality: N/A;  . COLONOSCOPY WITH PROPOFOL N/A 04/24/2019   Procedure: COLONOSCOPY WITH PROPOFOL;  Surgeon: Irving Copas., MD;  [facility];  Service: Gastroenterology;  Laterality: N/A;  . CORONARY ANGIOPLASTY WITH STENT PLACEMENT    . CORONARY ARTERY BYPASS GRAFT N/A 07/06/2015   Procedure: CORONARY ARTERY BYPASS GRAFTING (CABG)x 4   utilizing the left internal mammary artery and endoscopically harvested bilateral  sapheneous vein.;  Surgeon: Ivin Poot, MD;  Location: [place];  Service: Open Heart Surgery;  Laterality: N/A;  . ESOPHAGEAL DILATION  04/24/2019   Procedure: ESOPHAGEAL DILATION;  Surgeon: Rush Landmark Telford Nab., MD;  [facility];  Service: Gastroenterology;;  . ESOPHAGOGASTRODUODENOSCOPY (EGD) WITH PROPOFOL N/A 06/25/2018   Procedure: ESOPHAGOGASTRODUODENOSCOPY (EGD) WITH PROPOFOL;  Surgeon: Irving Copas., MD;  Location: [place];  Service: Gastroenterology;  Laterality: N/A;  . ESOPHAGOGASTRODUODENOSCOPY (EGD) WITH PROPOFOL N/A 04/24/2019   Procedure: ESOPHAGOGASTRODUODENOSCOPY (EGD) WITH PROPOFOL;  Surgeon: Rush Landmark Telford Nab., MD;  [facility];  Service: Gastroenterology;  Laterality: N/A;  . FEMUR IM NAIL Right 11/16/2019   Procedure: RIGHT HIP INTRAMEDULLARY (IM) NAIL FEMORAL;  Surgeon: Meredith Pel, MD;  Location: [place];  Service: Orthopedics;  Laterality: Right;  . HEMOSTASIS CLIP PLACEMENT  04/24/2019   Procedure: HEMOSTASIS CLIP PLACEMENT;  Surgeon: Irving Copas., MD;  [facility];  Service: Gastroenterology;;  . KNEE SURGERY    . LOOP RECORDER INSERTION N/A 02/19/2018   Procedure: LOOP  RECORDER INSERTION;  Surgeon: Deboraha Sprang, MD;  Location: [place] [facility];  Service: Cardiovascular;  Laterality: N/A;  . POLYPECTOMY  04/24/2019   Procedure: POLYPECTOMY;  Surgeon: Rush Landmark Telford Nab., MD;  [facility];  Service: Gastroenterology;;  . Lia Foyer LIFTING INJECTION  04/24/2019   Procedure: SUBMUCOSAL LIFTING INJECTION;  Surgeon: Irving Copas., MD;  [facility];  Service: Gastroenterology;;  . TEE WITHOUT CARDIOVERSION N/A 07/06/2015   Procedure: TRANSESOPHAGEAL ECHOCARDIOGRAM (TEE);  Surgeon: Ivin Poot, MD;  Location: [place];  Service: Open Heart Surgery;  Laterality: N/A;    There were no vitals filed for this visit.   Subjective Assessment - 09/03/20 1417    Subjective his Rt hip pain is about the same. He has not had any recent falls    Pertinent History PMH: repeated falls, pelvic fx, CVA X5, DM, CKD, CAD, CABGX4,mild dementia    Limitations Lifting;Standing;Walking;House hold activities    How long can you stand comfortably? 5 minutes    How long can you walk comfortably? 100 ft    Diagnostic tests see chart for lumbar/pelvic MRI and XR    Patient Stated Goals get legs stronger and walk again    Pain Onset More than a month ago             [facility] Adult PT Treatment/Exercise - 09/03/20 0001      Transfers   Transfers Sit to Stand;Stand to Sit    Sit to Stand 4: Min guard;With upper extremity assist;With armrests;From chair/3-in-1    Stand to Sit 5: Supervision;With upper extremity assist;With armrests;To chair/3-in-1;Other (comment)      Ambulation/Gait   Ambulation/Gait Yes    Ambulation/Gait Assistance 4: Min guard    Ambulation Distance (Feet) 110 Feet   then 70, 50   Assistive device Rolling walker      Knee/Hip Exercises: Aerobic   Nustep seat 12 Level 5  X8 min (at end of session).      Knee/Hip Exercises: Standing   Hip Flexion Both;Knee bent;2 sets;10 reps;Stengthening    Hip Flexion Limitations with  bilat UE support and 4#    Hip Abduction Both;2 sets;15 reps    Abduction Limitations with UE support, 4 lbs      Knee/Hip Exercises: Seated   Long Arc Quad Both;3 sets;10 reps    Long Arc Quad Weight 4 lbs.    Sit to Sand 10 reps;with UE support   chair with airex pad                   PT Short Term Goals - 08/19/20 1339      PT SHORT TERM GOAL #1   Title independent with initial HEP (Target for all STGs 07/10/20)    Time 4    Period Weeks    Status Achieved      PT SHORT TERM GOAL #2   Title Patient will ambulate with LRAD CGA at least 100 ft    Baseline 50 ft with RW  Time 4    Status Achieved      PT SHORT TERM GOAL #3   Title Pt will ambulate 175 feet with RW    Baseline ranges 50-100 ft    Time 4    Period Weeks    Status On-going    Target Date 08/27/20             PT Long Term Goals - 08/19/20 1340      PT LONG TERM GOAL #1   Title independent with updated HEP (Target all LTG 12 weeks  09/04/20)    Time 12    Period Weeks    Status On-going      PT LONG TERM GOAL #2   Title improve 5TSTS test to less than 25 seconds to show improved leg strength/endurance    Baseline 29 sec on 3/3    Time 12    Period Weeks    Status On-going      PT LONG TERM GOAL #3   Title Improve bilat leg strength to at least 4+/5 MMT measured in sitting to improve function    Baseline 4- to 4    Time 12    Period Weeks    Status On-going      PT LONG TERM GOAL #4   Title Will improve TUG to less than 30 sec to show improved balance and gait speed.    Baseline 28 sec on 4/20    Time 12    Period Weeks    Status Achieved      PT LONG TERM GOAL #5   Title Ambulate with LRAD mod I  for 300 ft or stand at least 10 min    Baseline 120 ft on 3/3 with RW    Time 12    Period Weeks    Status On-going                 Plan - 09/03/20 1423    Clinical Impression Statement We increased his resistance today with ankle weights for overall hip strengthening  with fair overall tolerance. It appears he has been more active at home and he was encouraged to do more frequent but short activity at home that he can tolerate.    Personal Factors and Comorbidities Comorbidity 3+;Time since onset of injury/illness/exacerbation;Past/Current Experience;Fitness    Comorbidities PMH: repeated falls, pelvic fx, CVA X5, DM, CKD, CAD, CABGX4,mild dementia    Examination-Activity Limitations Bathing;Bed Mobility;Bend;Carry;Lift;Stand;Toileting;Stairs;Squat;Locomotion Level    Examination-Participation Restrictions Art gallery manager;Shop    Stability/Clinical Decision Making Evolving/Moderate complexity    Rehab Potential Fair    PT Frequency 2x / week    PT Duration 8 weeks    PT Treatment/Interventions Aquatic Therapy;Cryotherapy;Dentist;Therapeutic activities;Therapeutic exercise;Neuromuscular re-education;Manual techniques;Patient/family education;Passive range of motion;Taping    PT Next Visit Plan leg strength, endurance, balance, gait with RW    PT Home Exercise Plan [facility]    Consulted and Agree with Plan of Care Patient           Patient will benefit from skilled therapeutic intervention in order to improve the following deficits and impairments:  Abnormal gait,Decreased activity tolerance,Decreased balance,Decreased endurance,Decreased coordination,Decreased range of motion,Decreased strength,Difficulty walking,Postural dysfunction,Pain,Improper body mechanics  Visit Diagnosis: Chronic bilateral low back pain with right-sided sciatica  Other abnormalities of gait and mobility  Muscle weakness (generalized)  Other symptoms and signs involving the nervous system  Chronic bilateral low back pain, unspecified whether sciatica present     Problem List  Patient Active Problem List   Diagnosis Date Noted  . Situational depression 01/22/2020  . Urinary frequency 01/22/2020  .  Bacteremia due to Staphylococcus epidermidis 11/24/2019  . Stroke (cerebrum) ([place])   . Elevated troponin   . Closed right hip fracture ([place]) 11/15/2019  . Unilateral primary osteoarthritis, right knee 11/13/2019  . At high risk for falls 05/29/2019  . Chronic bilateral low back pain without sciatica 02/07/2019  . Statin intolerance 11/16/2018  . Hyperlipidemia, mixed 08/15/2018  . Low back pain 07/18/2018  . History of lacunar cerebrovascular accident (CVA) 02/12/2018  . Dizziness 02/12/2018  . Myofascial pain 12/18/2017  . Spondylosis without myelopathy or radiculopathy, lumbar region 12/18/2017  . Lumbar radiculopathy, right 12/18/2017  . Cholelithiasis 05/17/2017  . Sinus Bradycardia 05/17/2017  . Thoracic radiculopathy 04/12/2017  . Primary osteoarthritis of right knee 12/14/2016  . DDD (degenerative disc disease), lumbar 10/10/2016  . Lumbar facet arthropathy 08/29/2016  . Idiopathic scoliosis 03/22/2016  . Diabetic neuropathy ([place]) 03/22/2016  . Dysautonomia orthostatic hypotension syndrome 02/25/2016  . CKD stage 3 due to type 2 diabetes mellitus ([place]) 02/19/2016  . Coronary artery disease involving coronary bypass graft of native heart without angina pectoris   . Diabetes mellitus type 2, diet-controlled ([place])   . 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 Medication management 08/11/2015  . 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 PVD (peripheral vascular disease) ([place]) 01/01/2014  . Hyperlipidemia associated with type 2 diabetes mellitus ([place]) 04/11/2013  . Supine hypertension   . Vitamin D deficiency   . Vitamin B 12 deficiency   . Coronary atherosclerosis- s/p PCI to LAD in 2009 and PCI to RCA in 2011 02/27/2009  . Abdominal aortic aneurysm ([place]) 02/27/2009  . GERD 02/27/2009    Silvestre Mesi 09/03/2020, 2:27 PM  [facility] Physical Therapy [address] [place], [place], [address] Phone: [phone]   Fax:  [phone]  Name: [name] MRN:  [id number] Date of Birth: July 06, 1933

## 2020-09-07 ENCOUNTER — Other Ambulatory Visit: Payer: Self-pay

## 2020-09-07 ENCOUNTER — Ambulatory Visit: Payer: Medicare Other | Admitting: Physical Therapy

## 2020-09-07 DIAGNOSIS — R29818 Other symptoms and signs involving the nervous system: Secondary | ICD-10-CM

## 2020-09-07 DIAGNOSIS — G8929 Other chronic pain: Secondary | ICD-10-CM

## 2020-09-07 DIAGNOSIS — R2689 Other abnormalities of gait and mobility: Secondary | ICD-10-CM

## 2020-09-07 DIAGNOSIS — M6281 Muscle weakness (generalized): Secondary | ICD-10-CM | POA: Diagnosis not present

## 2020-09-07 DIAGNOSIS — M545 Low back pain, unspecified: Secondary | ICD-10-CM

## 2020-09-07 DIAGNOSIS — M5441 Lumbago with sciatica, right side: Secondary | ICD-10-CM | POA: Diagnosis not present

## 2020-09-07 NOTE — Therapy (Signed)
[facility] Physical Therapy [address] [place], [place], [address] Phone: [phone]   Fax:  [phone]  Physical Therapy Treatment  Patient Details  Name: [name] MRN: [id number] Date of Birth: 07/07/1933 Referring Provider (PT): Royal Hawthorn, MD   Encounter Date: 09/07/2020   PT End of Session - 09/07/20 1329    Visit Number 21    Number of Visits 25    Date for PT Re-Evaluation 09/30/20    Authorization Type [employer] MCR    Progress Note Due on Visit 26    PT Start Time 1300    PT Stop Time 1345    PT Time Calculation (min) 45 min    Equipment Utilized During Treatment Gait belt    Activity Tolerance Patient limited by fatigue    Behavior During Therapy [facility] for tasks assessed/performed           Past Medical History:  Diagnosis Date  . AAA (abdominal aortic aneurysm) ([place])   . Aneurysm of iliac artery ([facility])   . CAD (coronary artery disease)    a. s/p CABGx4 in 07/2015.  . Carotid artery disease ([place])   . Colon polyps   . Diabetes ([place])   . Difficult intubation   . 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 Esophageal reflux   . High cholesterol   . History of IBS 02/27/2009  . Hypertension    pt denies, he says he has a h/o hypotension. If BP up he adjusts the [place]  . Jejunitis with partial SBO 05/20/2017  . Orthostatic hypotension   . Prostatitis   . Stroke ([place])   . TIA (transient ischemic attack)   . Upper GI bleed 06/24/2018  . Vitamin B 12 deficiency   . Vitamin D deficiency     Past Surgical History:  Procedure Laterality Date  . BIOPSY  06/25/2018   Procedure: BIOPSY;  Surgeon: Rush Landmark Telford Nab., MD;  Location: [place];  Service: Gastroenterology;;  . BIOPSY  04/24/2019   Procedure: BIOPSY;  Surgeon: Irving Copas., MD;  Location: [facility];  Service: Gastroenterology;;  . BLADDER SURGERY  1969   traumatic pelvic fractures, urethral and bladder repair  . CARDIAC CATHETERIZATION N/A 07/01/2015    Procedure: Left Heart Cath and Coronary Angiography;  Surgeon: Wellington Hampshire, MD;  Location: [place] [facility];  Service: Cardiovascular;  Laterality: N/A;  . COLON RESECTION N/A 05/17/2017   Procedure: DIAGNOSTIC LAPAROSCOPY,;  Surgeon: Leighton Ruff, MD;  Location: [facility];  Service: General;  Laterality: N/A;  . COLONOSCOPY WITH PROPOFOL N/A 04/24/2019   Procedure: COLONOSCOPY WITH PROPOFOL;  Surgeon: Irving Copas., MD;  Location: [facility];  Service: Gastroenterology;  Laterality: N/A;  . CORONARY ANGIOPLASTY WITH STENT PLACEMENT    . CORONARY ARTERY BYPASS GRAFT N/A 07/06/2015   Procedure: CORONARY ARTERY BYPASS GRAFTING (CABG)x 4   utilizing the left internal mammary artery and endoscopically harvested bilateral  sapheneous vein.;  Surgeon: Ivin Poot, MD;  Location: [place];  Service: Open Heart Surgery;  Laterality: N/A;  . ESOPHAGEAL DILATION  04/24/2019   Procedure: ESOPHAGEAL DILATION;  Surgeon: Rush Landmark Telford Nab., MD;  Location: [facility];  Service: Gastroenterology;;  . ESOPHAGOGASTRODUODENOSCOPY (EGD) WITH PROPOFOL N/A 06/25/2018   Procedure: ESOPHAGOGASTRODUODENOSCOPY (EGD) WITH PROPOFOL;  Surgeon: Irving Copas., MD;  Location: [place];  Service: Gastroenterology;  Laterality: N/A;  . ESOPHAGOGASTRODUODENOSCOPY (EGD) WITH PROPOFOL N/A 04/24/2019   Procedure: ESOPHAGOGASTRODUODENOSCOPY (EGD) WITH PROPOFOL;  Surgeon: Rush Landmark Telford Nab., MD;  Location: [facility];  Service: Gastroenterology;  Laterality: N/A;  . FEMUR IM NAIL Right 11/16/2019   Procedure: RIGHT HIP INTRAMEDULLARY (IM) NAIL FEMORAL;  Surgeon: Meredith Pel, MD;  Location: [place];  Service: Orthopedics;  Laterality: Right;  . HEMOSTASIS CLIP PLACEMENT  04/24/2019   Procedure: HEMOSTASIS CLIP PLACEMENT;  Surgeon: Irving Copas., MD;  Location: [facility];  Service: Gastroenterology;;  . KNEE SURGERY    . LOOP RECORDER INSERTION N/A 02/19/2018   Procedure: LOOP  RECORDER INSERTION;  Surgeon: Deboraha Sprang, MD;  Location: [place] [facility];  Service: Cardiovascular;  Laterality: N/A;  . POLYPECTOMY  04/24/2019   Procedure: POLYPECTOMY;  Surgeon: Rush Landmark Telford Nab., MD;  Location: [facility];  Service: Gastroenterology;;  . Lia Foyer LIFTING INJECTION  04/24/2019   Procedure: SUBMUCOSAL LIFTING INJECTION;  Surgeon: Irving Copas., MD;  Location: [facility];  Service: Gastroenterology;;  . TEE WITHOUT CARDIOVERSION N/A 07/06/2015   Procedure: TRANSESOPHAGEAL ECHOCARDIOGRAM (TEE);  Surgeon: Ivin Poot, MD;  Location: [place];  Service: Open Heart Surgery;  Laterality: N/A;    There were no vitals filed for this visit.   Subjective Assessment - 09/07/20 1318    Subjective his wife relays he has had high blood pressure over the weekend so he has not been doing any exercise, she checked it before he came and it was back down to 160/80    Pertinent History PMH: repeated falls, pelvic fx, CVA X5, DM, CKD, CAD, CABGX4,mild dementia    Limitations Lifting;Standing;Walking;House hold activities    How long can you stand comfortably? 5 minutes    How long can you walk comfortably? 100 ft    Diagnostic tests see chart for lumbar/pelvic MRI and XR    Patient Stated Goals get legs stronger and walk again    Pain Onset More than a month ago            [facility] Adult PT Treatment/Exercise - 09/07/20 0001      Transfers   Transfers Sit to Stand;Stand to Sit    Sit to Stand 4: Min guard;With upper extremity assist;With armrests;From chair/3-in-1    Stand to Sit 5: Supervision;With upper extremity assist;With armrests;To chair/3-in-1;Other (comment)      Ambulation/Gait   Ambulation/Gait Yes    Ambulation/Gait Assistance 4: Min guard    Ambulation Distance (Feet) 130 Feet   then 110, 70   Assistive device Rolling walker      Knee/Hip Exercises: Aerobic   Nustep seat 12 Level 5  X6 min (at end of session).      Knee/Hip Exercises:  Standing   Hip Flexion Both;Knee bent;2 sets;10 reps;Stengthening    Hip Flexion Limitations with bilat UE support and 4#    Hip Abduction Both;2 sets;15 reps    Abduction Limitations with UE support, 4 lbs    Hip Extension Both;2 sets;10 reps    Extension Limitations 4#    Other Standing Knee Exercises unilateral rows with other UE support in RW X20 ea      Knee/Hip Exercises: Seated   Long Arc Quad Both;3 sets;10 reps    Long Arc Quad Weight 4 lbs.    Sit to Sand 10 reps;with UE support   chair with airex pad                   PT Short Term Goals - 08/19/20 1339      PT SHORT TERM GOAL #1   Title independent with initial HEP (Target for all STGs 07/10/20)  Time 4    Period Weeks    Status Achieved      PT SHORT TERM GOAL #2   Title Patient will ambulate with LRAD CGA at least 100 ft    Baseline 50 ft with RW    Time 4    Status Achieved      PT SHORT TERM GOAL #3   Title Pt will ambulate 175 feet with RW    Baseline ranges 50-100 ft    Time 4    Period Weeks    Status On-going    Target Date 08/27/20             PT Long Term Goals - 08/19/20 1340      PT LONG TERM GOAL #1   Title independent with updated HEP (Target all LTG 12 weeks  09/04/20)    Time 12    Period Weeks    Status On-going      PT LONG TERM GOAL #2   Title improve 5TSTS test to less than 25 seconds to show improved leg strength/endurance    Baseline 29 sec on 3/3    Time 12    Period Weeks    Status On-going      PT LONG TERM GOAL #3   Title Improve bilat leg strength to at least 4+/5 MMT measured in sitting to improve function    Baseline 4- to 4    Time 12    Period Weeks    Status On-going      PT LONG TERM GOAL #4   Title Will improve TUG to less than 30 sec to show improved balance and gait speed.    Baseline 28 sec on 4/20    Time 12    Period Weeks    Status Achieved      PT LONG TERM GOAL #5   Title Ambulate with LRAD mod I  for 300 ft or stand at least 10 min     Baseline 120 ft on 3/3 with RW    Time 12    Period Weeks    Status On-going                 Plan - 09/07/20 1330    Clinical Impression Statement he continues to be limited by hip pain and overall fatigue/Rt leg weakness however he has been able to progress his max tolerated gait distance over the last few weeks and we will continue to work to improve his function.    Personal Factors and Comorbidities Comorbidity 3+;Time since onset of injury/illness/exacerbation;Past/Current Experience;Fitness    Comorbidities PMH: repeated falls, pelvic fx, CVA X5, DM, CKD, CAD, CABGX4,mild dementia    Examination-Activity Limitations Bathing;Bed Mobility;Bend;Carry;Lift;Stand;Toileting;Stairs;Squat;Locomotion Level    Examination-Participation Restrictions Art gallery manager;Shop    Stability/Clinical Decision Making Evolving/Moderate complexity    Rehab Potential Fair    PT Frequency 2x / week    PT Duration 8 weeks    PT Treatment/Interventions Aquatic Therapy;Cryotherapy;Dentist;Therapeutic activities;Therapeutic exercise;Neuromuscular re-education;Manual techniques;Patient/family education;Passive range of motion;Taping    PT Next Visit Plan leg strength, endurance, balance, gait with RW    PT Home Exercise Plan [facility]    Consulted and Agree with Plan of Care Patient           Patient will benefit from skilled therapeutic intervention in order to improve the following deficits and impairments:  Abnormal gait,Decreased activity tolerance,Decreased balance,Decreased endurance,Decreased coordination,Decreased range of motion,Decreased strength,Difficulty walking,Postural dysfunction,Pain,Improper body mechanics  Visit Diagnosis:  Chronic bilateral low back pain with right-sided sciatica  Other abnormalities of gait and mobility  Muscle weakness (generalized)  Other symptoms and signs involving the nervous  system  Chronic bilateral low back pain, unspecified whether sciatica present     Problem List Patient Active Problem List   Diagnosis Date Noted  . Situational depression 01/22/2020  . Urinary frequency 01/22/2020  . Bacteremia due to Staphylococcus epidermidis 11/24/2019  . Stroke (cerebrum) ([place])   . Elevated troponin   . Closed right hip fracture ([place]) 11/15/2019  . Unilateral primary osteoarthritis, right knee 11/13/2019  . At high risk for falls 05/29/2019  . Chronic bilateral low back pain without sciatica 02/07/2019  . Statin intolerance 11/16/2018  . Hyperlipidemia, mixed 08/15/2018  . Low back pain 07/18/2018  . History of lacunar cerebrovascular accident (CVA) 02/12/2018  . Dizziness 02/12/2018  . Myofascial pain 12/18/2017  . Spondylosis without myelopathy or radiculopathy, lumbar region 12/18/2017  . Lumbar radiculopathy, right 12/18/2017  . Cholelithiasis 05/17/2017  . Sinus Bradycardia 05/17/2017  . Thoracic radiculopathy 04/12/2017  . Primary osteoarthritis of right knee 12/14/2016  . DDD (degenerative disc disease), lumbar 10/10/2016  . Lumbar facet arthropathy 08/29/2016  . Idiopathic scoliosis 03/22/2016  . Diabetic neuropathy ([place]) 03/22/2016  . Dysautonomia orthostatic hypotension syndrome 02/25/2016  . CKD stage 3 due to type 2 diabetes mellitus ([place]) 02/19/2016  . Coronary artery disease involving coronary bypass graft of native heart without angina pectoris   . Diabetes mellitus type 2, diet-controlled ([place])   . 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 Medication management 08/11/2015  . 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 PVD (peripheral vascular disease) ([place]) 01/01/2014  . Hyperlipidemia associated with type 2 diabetes mellitus ([place]) 04/11/2013  . Supine hypertension   . Vitamin D deficiency   . Vitamin B 12 deficiency   . Coronary atherosclerosis- s/p PCI to LAD in 2009 and PCI to RCA in 2011 02/27/2009  . Abdominal aortic aneurysm ([place]) 02/27/2009  .  GERD 02/27/2009    Silvestre Mesi 09/07/2020, 2:39 PM  [facility] Physical Therapy [address] [place], [place], [address] Phone: [phone]   Fax:  [phone]  Name: [name] MRN: [id number] Date of Birth: Sep 25, 1933

## 2020-09-09 ENCOUNTER — Ambulatory Visit: Payer: Medicare Other | Admitting: Physical Therapy

## 2020-09-09 ENCOUNTER — Other Ambulatory Visit: Payer: Self-pay

## 2020-09-09 ENCOUNTER — Encounter: Payer: Self-pay | Admitting: Physical Therapy

## 2020-09-09 DIAGNOSIS — M5441 Lumbago with sciatica, right side: Secondary | ICD-10-CM

## 2020-09-09 DIAGNOSIS — M6281 Muscle weakness (generalized): Secondary | ICD-10-CM

## 2020-09-09 DIAGNOSIS — G8929 Other chronic pain: Secondary | ICD-10-CM

## 2020-09-09 DIAGNOSIS — R29818 Other symptoms and signs involving the nervous system: Secondary | ICD-10-CM

## 2020-09-09 DIAGNOSIS — R2689 Other abnormalities of gait and mobility: Secondary | ICD-10-CM | POA: Diagnosis not present

## 2020-09-09 DIAGNOSIS — M545 Low back pain, unspecified: Secondary | ICD-10-CM

## 2020-09-09 NOTE — Therapy (Addendum)
[facility] [address] [place], [place], [address] Phone: [phone]   Fax:  [phone]  Physical Therapy Treatment/Discharge addendum PHYSICAL THERAPY DISCHARGE SUMMARY  Visits from Start of Care: 22 Current functional level related to goals / functional outcomes: See below   Remaining deficits: See below   Education / Equipment: HEP and recommendation to follow up with MD about continued pain  Plan:                                                    Patient goals were not met. Patient is being discharged due to lack of progress.  ?????   We referred him back to MD for further evaluation who has recommended injections.    Patient Details  Name: [name] MRN: [id number] Date of Birth: December 12, 1933 Referring Provider (PT): Royal Hawthorn, MD   Encounter Date: 09/09/2020   PT End of Session - 09/09/20 1336    Visit Number 22    Number of Visits 25    Date for PT Re-Evaluation 09/30/20    Authorization Type [employer] MCR    Progress Note Due on Visit 26    PT Start Time 1300    PT Stop Time 1340    PT Time Calculation (min) 40 min    Equipment Utilized During Treatment Gait belt    Activity Tolerance Patient limited by fatigue    Behavior During Therapy WFL for tasks assessed/performed           Past Medical History:  Diagnosis Date  . AAA (abdominal aortic aneurysm) ([place])   . Aneurysm of iliac artery ([facility])   . CAD (coronary artery disease)    a. s/p CABGx4 in 07/2015.  . Carotid artery disease ([place])   . Colon polyps   . Diabetes ([place])   . Difficult intubation   . 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 Esophageal reflux   . High cholesterol   . History of IBS 02/27/2009  . Hypertension    pt denies, he says he has a h/o hypotension. If BP up he adjusts the [place]  . Jejunitis with partial SBO 05/20/2017  . Orthostatic hypotension   . Prostatitis   . Stroke ([place])   . TIA (transient ischemic attack)   . Upper GI bleed  06/24/2018  . Vitamin B 12 deficiency   . Vitamin D deficiency     Past Surgical History:  Procedure Laterality Date  . BIOPSY  06/25/2018   Procedure: BIOPSY;  Surgeon: Rush Landmark Telford Nab., MD;  Location: [place];  Service: Gastroenterology;;  . BIOPSY  04/24/2019   Procedure: BIOPSY;  Surgeon: Irving Copas., MD;  Location: [facility];  Service: Gastroenterology;;  . BLADDER SURGERY  1969   traumatic pelvic fractures, urethral and bladder repair  . CARDIAC CATHETERIZATION N/A 07/01/2015   Procedure: Left Heart Cath and Coronary Angiography;  Surgeon: Wellington Hampshire, MD;  Location: [place] [facility];  Service: Cardiovascular;  Laterality: N/A;  . COLON RESECTION N/A 05/17/2017   Procedure: DIAGNOSTIC LAPAROSCOPY,;  Surgeon: Leighton Ruff, MD;  Location: [facility];  Service: General;  Laterality: N/A;  . COLONOSCOPY WITH PROPOFOL N/A 04/24/2019   Procedure: COLONOSCOPY WITH PROPOFOL;  Surgeon: Irving Copas., MD;  Location: [facility];  Service: Gastroenterology;  Laterality: N/A;  . CORONARY ANGIOPLASTY WITH STENT PLACEMENT    .  CORONARY ARTERY BYPASS GRAFT N/A 07/06/2015   Procedure: CORONARY ARTERY BYPASS GRAFTING (CABG)x 4   utilizing the left internal mammary artery and endoscopically harvested bilateral  sapheneous vein.;  Surgeon: Ivin Poot, MD;  Location: [place];  Service: Open Heart Surgery;  Laterality: N/A;  . ESOPHAGEAL DILATION  04/24/2019   Procedure: ESOPHAGEAL DILATION;  Surgeon: Rush Landmark Telford Nab., MD;  Location: [facility];  Service: Gastroenterology;;  . ESOPHAGOGASTRODUODENOSCOPY (EGD) WITH PROPOFOL N/A 06/25/2018   Procedure: ESOPHAGOGASTRODUODENOSCOPY (EGD) WITH PROPOFOL;  Surgeon: Irving Copas., MD;  Location: [facility];  Service: Gastroenterology;  Laterality: N/A;  . ESOPHAGOGASTRODUODENOSCOPY (EGD) WITH PROPOFOL N/A 04/24/2019   Procedure: ESOPHAGOGASTRODUODENOSCOPY (EGD) WITH PROPOFOL;  Surgeon: Rush Landmark Telford Nab., MD;  Location: [facility];  Service: Gastroenterology;  Laterality: N/A;  . FEMUR IM NAIL Right 11/16/2019   Procedure: RIGHT HIP INTRAMEDULLARY (IM) NAIL FEMORAL;  Surgeon: Meredith Pel, MD;  Location: [place];  Service: Orthopedics;  Laterality: Right;  . HEMOSTASIS CLIP PLACEMENT  04/24/2019   Procedure: HEMOSTASIS CLIP PLACEMENT;  Surgeon: Irving Copas., MD;  Location: [facility];  Service: Gastroenterology;;  . KNEE SURGERY    . LOOP RECORDER INSERTION N/A 02/19/2018   Procedure: LOOP RECORDER INSERTION;  Surgeon: Deboraha Sprang, MD;  Location: [place];  Service: Cardiovascular;  Laterality: N/A;  . POLYPECTOMY  04/24/2019   Procedure: POLYPECTOMY;  Surgeon: Rush Landmark Telford Nab., MD;  Location: [facility];  Service: Gastroenterology;;  . Lia Foyer LIFTING INJECTION  04/24/2019   Procedure: SUBMUCOSAL LIFTING INJECTION;  Surgeon: Irving Copas., MD;  Location: [facility];  Service: Gastroenterology;;  . TEE WITHOUT CARDIOVERSION N/A 07/06/2015   Procedure: TRANSESOPHAGEAL ECHOCARDIOGRAM (TEE);  Surgeon: Ivin Poot, MD;  Location: [facility];  Service: Open Heart Surgery;  Laterality: N/A;    There were no vitals filed for this visit.   Subjective Assessment - 09/09/20 1307    Subjective he relays 8/10 overall pain in his Rt posterior hip today and his Rt leg feels like it will give out.    Pertinent History PMH: repeated falls, pelvic fx, CVA X5, DM, CKD, CAD, CABGX4,mild dementia    Limitations Lifting;Standing;Walking;House hold activities    How long can you stand comfortably? 5 minutes    How long can you walk comfortably? 100 ft    Diagnostic tests see chart for lumbar/pelvic MRI and XR    Patient Stated Goals get legs stronger and walk again    Pain Onset More than a month ago            [facility] Adult PT Treatment/Exercise - 09/09/20 0001      Transfers   Transfers Sit to Stand;Stand to Sit    Sit to Stand 4: Min guard;With  upper extremity assist;With armrests;From chair/3-in-1    Stand to Sit 5: Supervision;With upper extremity assist;With armrests;To chair/3-in-1;Other (comment)      Ambulation/Gait   Ambulation/Gait Yes    Ambulation/Gait Assistance 4: Min guard    Ambulation Distance (Feet) 110 Feet   then 70 then 50   Assistive device Rolling walker      Knee/Hip Exercises: Aerobic   Nustep seat 12 Level 4  X6 min (at end of session).      Knee/Hip Exercises: Standing   Hip Flexion Both;Knee bent;2 sets;10 reps;Stengthening    Hip Flexion Limitations with bilat UE support and 4#    Hip Abduction Both;2 sets;15 reps    Abduction Limitations with UE support, 4 lbs  Hip Extension Both;2 sets;10 reps    Extension Limitations 4#    Other Standing Knee Exercises unilateral rows with other UE support in RW X20 ea      Knee/Hip Exercises: Seated   Long Arc Quad Both;3 sets;10 reps    Long Arc Quad Weight 4 lbs.    Ball Squeeze 5 sec hold 2X10    Clamshell with [employer]ra[employer] Green   4X10   Sit to [employer] with UE support;2 sets;5 reps   chair with airex pad                   PT Short Term Goals - 08/19/20 1339      PT SHORT TERM GOAL #1   Title independent with initial HEP (Target for all STGs 07/10/20)    Time 4    Period Weeks    Status Achieved      PT SHORT TERM GOAL #2   Title Patient will ambulate with LRAD CGA at least 100 ft    Baseline 50 ft with RW    Time 4    Status Achieved      PT SHORT TERM GOAL #3   Title Pt will ambulate 175 feet with RW    Baseline ranges 50-100 ft    Time 4    Period Weeks    Status On-going    Target Date 08/27/20             PT Long Term Goals - 08/19/20 1340      PT LONG TERM GOAL #1   Title independent with updated HEP (Target all LTG 12 weeks  09/04/20)    Time 12    Period Weeks    Status On-going      PT LONG TERM GOAL #2   Title improve 5TSTS test to less than 25 seconds to show improved leg strength/endurance    Baseline 29 sec  on 3/3    Time 12    Period Weeks    Status On-going      PT LONG TERM GOAL #3   Title Improve bilat leg strength to at least 4+/5 MMT measured in sitting to improve function    Baseline 4- to 4    Time 12    Period Weeks    Status On-going      PT LONG TERM GOAL #4   Title Will improve TUG to less than 30 sec to show improved balance and gait speed.    Baseline 28 sec on 4/20    Time 12    Period Weeks    Status Achieved      PT LONG TERM GOAL #5   Title Ambulate with LRAD mod I  for 300 ft or stand at least 10 min    Baseline 120 ft on 3/3 with RW    Time 12    Period Weeks    Status On-going                 Plan - 09/09/20 1337    Clinical Impression Statement He was not able ambulate as far today due to Rt hip pain. Overall has not made as much progress this episode of PT compared to last due to continued Rt hip pain. He will follow back up with MD next week and PT will await further MD recommendations.    Personal Factors and Comorbidities Comorbidity 3+;Time since onset of injury/illness/exacerbation;Past/Current Experience;Fitness    Comorbidities PMH: repeated falls, pelvic fx, CVA  X5, DM, CKD, CAD, CABGX4,mild dementia    Examination-Activity Limitations Bathing;Bed Mobility;Bend;Carry;Lift;Stand;Toileting;Stairs;Squat;Locomotion Level    Examination-Participation Restrictions Art gallery manager;Shop    Stability/Clinical Decision Making Evolving/Moderate complexity    Rehab Potential Fair    PT Frequency 2x / week    PT Duration 8 weeks    PT Treatment/Interventions Aquatic Therapy;Cryotherapy;Dentist;Therapeutic activities;Therapeutic exercise;Neuromuscular re-education;Manual techniques;Patient/family education;Passive range of motion;Taping    PT Next Visit Plan leg strength, endurance, balance, gait with RW    PT Home Exercise Plan [facility]    Consulted and Agree with Plan of Care Patient            Patient will benefit from skilled therapeutic intervention in order to improve the following deficits and impairments:  Abnormal gait,Decreased activity tolerance,Decreased balance,Decreased endurance,Decreased coordination,Decreased range of motion,Decreased strength,Difficulty walking,Postural dysfunction,Pain,Improper body mechanics  Visit Diagnosis: Chronic bilateral low back pain with right-sided sciatica  Other abnormalities of gait and mobility  Muscle weakness (generalized)  Other symptoms and signs involving the nervous system  Chronic bilateral low back pain, unspecified whether sciatica present     Problem List Patient Active Problem List   Diagnosis Date Noted  . Situational depression 01/22/2020  . Urinary frequency 01/22/2020  . Bacteremia due to Staphylococcus epidermidis 11/24/2019  . Stroke (cerebrum) ([place])   . Elevated troponin   . Closed right hip fracture ([place]) 11/15/2019  . Unilateral primary osteoarthritis, right knee 11/13/2019  . At high risk for falls 05/29/2019  . Chronic bilateral low back pain without sciatica 02/07/2019  . Statin intolerance 11/16/2018  . Hyperlipidemia, mixed 08/15/2018  . Low back pain 07/18/2018  . History of lacunar cerebrovascular accident (CVA) 02/12/2018  . Dizziness 02/12/2018  . Myofascial pain 12/18/2017  . Spondylosis without myelopathy or radiculopathy, lumbar region 12/18/2017  . Lumbar radiculopathy, right 12/18/2017  . Cholelithiasis 05/17/2017  . Sinus Bradycardia 05/17/2017  . Thoracic radiculopathy 04/12/2017  . Primary osteoarthritis of right knee 12/14/2016  . DDD (degenerative disc disease), lumbar 10/10/2016  . Lumbar facet arthropathy 08/29/2016  . Idiopathic scoliosis 03/22/2016  . Diabetic neuropathy ([place]) 03/22/2016  . Dysautonomia orthostatic hypotension syndrome 02/25/2016  . CKD stage 3 due to type 2 diabetes mellitus ([place]) 02/19/2016  . Coronary artery disease involving coronary  bypass graft of native heart without angina pectoris   . Diabetes mellitus type 2, diet-controlled ([place])   . 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 Medication management 08/11/2015  . 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 PVD (peripheral vascular disease) ([place]) 01/01/2014  . Hyperlipidemia associated with type 2 diabetes mellitus ([place]) 04/11/2013  . Supine hypertension   . Vitamin D deficiency   . Vitamin B 12 deficiency   . Coronary atherosclerosis- s/p PCI to LAD in 2009 and PCI to RCA in 2011 02/27/2009  . Abdominal aortic aneurysm ([place]) 02/27/2009  . GERD 02/27/2009    Silvestre Mesi 09/09/2020, 1:38 PM  [facility] [address] [place], [place], [address] Phone: [phone]   Fax:  [phone]  Name: KAUSHAL VANNICE MRN: [id number] Date of Birth: [birth year]

## 2020-09-11 NOTE — Progress Notes (Signed)
Carelink Summary Report / Loop Recorder 

## 2020-09-14 ENCOUNTER — Telehealth: Payer: Self-pay

## 2020-09-14 NOTE — Telephone Encounter (Signed)
Blood Pressure this morning was 170/100.Took 1/2 tablet of BP med and checked it 45 minutes later and it was 180/104.  Dr. Melford Aase aware and advised patient to take 1 whole tablet and continue to monitor for the next few days. Call us if numbers are still trending up.

## 2020-09-15 ENCOUNTER — Ambulatory Visit: Payer: Medicare Other | Admitting: Neurology

## 2020-09-16 ENCOUNTER — Ambulatory Visit: Payer: Medicare Other | Admitting: Neurology

## 2020-09-16 ENCOUNTER — Encounter: Payer: Self-pay | Admitting: Neurology

## 2020-09-16 VITALS — BP 174/98 | HR 77 | Ht 72.0 in | Wt 190.8 lb

## 2020-09-16 DIAGNOSIS — R413 Other amnesia: Secondary | ICD-10-CM

## 2020-09-16 DIAGNOSIS — Z8673 Personal history of transient ischemic attack (TIA), and cerebral infarction without residual deficits: Secondary | ICD-10-CM

## 2020-09-16 DIAGNOSIS — F0150 Vascular dementia without behavioral disturbance: Secondary | ICD-10-CM

## 2020-09-16 NOTE — Patient Instructions (Signed)
I had a long discussion with the patient and his wife regarding his memory loss and mild dementia and prior strokes and answered questions.  Continue Namenda and the current dose of 10 mg twice daily which he seems to be tolerating well.  I encouraged him to increase participation in cognitively challenging activities like solving crossword puzzles, playing bridge and sodoku.  We also discussed memory compensation strategies.  He was encouraged to continue his ongoing outpatient physical therapy and we discussed fall precautions.  He will stay on aspirin for stroke prevention and maintain aggressive risk factor modification with strict control of hypertension with blood pressure goal below 130/90, lipids with LDL cholesterol goal below 70 mg percent and diabetes with hemoglobin A1c goal below 6.5%.  We will also check EEG for any silent seizures.  He will return for follow-up in the future in 6 months or call earlier if necessary. Memory Compensation Strategies  1. Use "WARM" strategy.  W= write it down  A= associate it  R= repeat it  M= make a mental note  2.   You can keep a Social worker.  Use a 3-ring notebook with sections for the following: calendar, important names and phone numbers,  medications, doctors' names/phone numbers, lists/reminders, and a section to journal what you did  each day.   3.    Use a calendar to write appointments down.  4.    Write yourself a schedule for the day.  This can be placed on the calendar or in a separate section of the Memory Notebook.  Keeping a  regular schedule can help memory.  5.    Use medication organizer with sections for each day or morning/evening pills.  You may need help loading it  6.    Keep a basket, or pegboard by the door.  Place items that you need to take out with you in the basket or on the pegboard.  You may also want to  include a message board for reminders.  7.    Use sticky notes.  Place sticky notes with reminders in a  place where the task is performed.  For example: " turn off the  stove" placed by the stove, "lock the door" placed on the door at eye level, " take your medications" on  the bathroom mirror or by the place where you normally take your medications.  8.    Use alarms/timers.  Use while cooking to remind yourself to check on food or as a reminder to take your medicine, or as a  reminder to make a call, or as a reminder to perform another task, etc.

## 2020-09-16 NOTE — Progress Notes (Signed)
[facility] [address] [place]. [place] [address] [phone]       OFFICE FOLLOW UP VISIT NOTE  [name] Date of Birth:  10-10-1933 Medical Record Number:  [id number]   Referring MD:  Basil Dess  Reason for Referral:  stroke HPI: initial visit 10/03/2019 [name] is a pleasant 87 year old Caucasian male with past medical history of peripheral vascular disease, coronary artery disease, diabetes, hypertension, hyperlipidemia, orthostatic hypotension, vitamin B12 deficiency, arthritis with multiple strokes who is seen today for a consultation visit accompanied by his wife.  History is obtained from them and review of electronic medical records and I personally reviewed imaging films in PACS.  Patient was recently seen in the emergency room on 09/25/2019 with an episode of altered mental status, confusion and EMS apparently noticed left upper extremity drift.  Patient was been treated for UTI with antibiotics.  His symptoms resolved.  CT scan was unremarkable MRI scan of the brain was obtained which I personally reviewed showed no acute abnormalities.  Old bilateral basal ganglia and cerebellar lacunar infarcts are noted.  He has subsequently been referred to urology but the referral is pending.  Patient complains of dizziness which is constant and seems to be more aggravated with upright position.  His blood pressure tends to run on the low side he is on Florinef.  Patient had a fall last month and sustained a pelvic fracture he is in pain and does walk with a walker.  He also has chronic diabetic neuropathy and takes Lyrica which seems to help neuropathic pain but he does have imbalance from this.  He is also had multiple strokes and was previously seen in our practice in 2019 for right temporal stroke as well as subsequently later followed corpus callosum lacunar infarct.  He also has mild baseline dementia which as per the wife is stable.  He is presently on aspirin  for stroke prevention and tolerating it well.  His blood pressure seems under good control and tends to run low.  His diabetes control has also been satisfactory though has not had recent A1c of lipid profile that I can see in the computer.  He was last seen in the practice on 12/03/2018 by Claris Gower nurse practitioner.    Update 05/27/2020 : He returns for follow-up after last visit in June 2021.  Is accompanied by his wife.  Patient was admitted for hip surgery in July 2021 to [facility] and postop developed strokes.  His level right-sided weakness and MRI scan showed multiple embolic strokes involving the right cerebellum, left parietal lobe.  2D echo showed normal ejection fraction without cardiac source of embolism.  LDL cholesterol 59 mg percent.  Hemoglobin A1c was 5.1.  Loop interrogation did not reveal evidence of A. fib.  Carotid Doppler was not done as previously in June 2021 and showed bilateral 40 to 59% stenosis.  His hospital course was complicated by confusion and encephalopathy which is related to cystitis.  He was treated with antibiotics.  Started on Xarelto for hip surgery post postop prophylaxis.  Patient has made gradual improvement but is still not able to walk back to baseline.  His balance is poor.  He does use a walker but does not walk a lot as per his wife.  She is also noticed decreased short-term memory and cognitive impairment which is more pronounced now.  Patient is also having some vision difficulty with increased watering of his eyes and plans to see an  eye doctor next month.  He is on aspirin she is tolerating well without bruising or bleeding.  His blood pressure is under good control at home but today slightly elevated in office at 157/97..  Is tolerating Pravachol well without side effects. Update 09/16/2020: He returns for follow-up after last visit 4 months ago.  Is accompanied by his wife.  He had lab work for reversible causes of cognitive impairment at last visit which  was all normal but for unclear reasons EEG was not done.  Continues to have memory difficulties particularly short-term which appear to be unchanged.  He has been able to tolerate Namenda 10 mg twice daily without any side effects.  Wife is not sure is helping but he is not certainly getting worse.  He has been mainly troubled with short-term memory and remembering recent events.  He can manage most of his affairs except physical is limited due to his hip pain and bending down.  He has had no new stroke or TIA symptoms.  He denies any agitations, violent behavior, delusions or hallucinations.  Continues to walk with a walker and has had no recent falls.  He continues to get outpatient physical therapy for his gait and balance.  He is scheduled to undergo follow-up carotid ultrasound next week at his cardiologist office.  He has no new complaints today. ROS:   14 system review of systems is positive for memory loss,  altered mental status, weakness, paresthesias, imbalance, urinary retention all other systems negative  PMH:  Past Medical History:  Diagnosis Date  . AAA (abdominal aortic aneurysm) ([place])   . Aneurysm of iliac artery ([facility])   . CAD (coronary artery disease)    a. s/p CABGx4 in 07/2015.  . Carotid artery disease ([place])   . Colon polyps   . Diabetes ([place])   . Difficult intubation   . 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 Esophageal reflux   . High cholesterol   . History of IBS 02/27/2009  . Hypertension    pt denies, he says he has a h/o hypotension. If BP up he adjusts the Florinef  . Jejunitis with partial SBO 05/20/2017  . Orthostatic hypotension   . Prostatitis   . Stroke ([place])   . TIA (transient ischemic attack)   . Upper GI bleed 06/24/2018  . Vitamin B 12 deficiency   . Vitamin D deficiency     Social History:  Social History   Socioeconomic History  . Marital status: Married    Spouse name: Estill Bamberg  . Number of children: 2  . Years of education: Not on file  .  Highest education level: High school graduate  Occupational History  . Occupation: Retired  Tobacco Use  . Smoking status: Former Smoker    Quit date: 07/16/1963    Years since quitting: 57.2  . Smokeless tobacco: Former Systems developer    Types: [place] date: 1989  Media planner  . Vaping Use: Never used  Substance and Sexual Activity  . Alcohol use: No  . Drug use: No  . Sexual activity: Not Currently  Other Topics Concern  . Not on file  Social History Narrative   Lives at home with his wife Estill Bamberg   Right handed   No caffeine   Social Determinants of Health   Financial Resource Strain: Not on file  Food Insecurity: Not on file  Transportation Needs: Not on file  Physical Activity: Not on file  Stress: Not on file  Social Connections: Not on file  Intimate Partner Violence: Not on file    Medications:   Current Outpatient Medications on File Prior to Visit  Medication Sig Dispense Refill  . acetaminophen (TYLENOL) 500 MG tablet Take 1,000 mg by mouth in the morning and at bedtime.    Marland Kitchen aspirin EC 81 MG tablet Take 2 tablets (162 mg total) by mouth daily.    Marland Kitchen buPROPion (WELLBUTRIN XL) 150 MG 24 hr tablet TAKE 1 TABLET BY MOUTH EVERY DAY IN THE MORNING 90 tablet 3  . butalbital-acetaminophen-caffeine (FIORICET) 50-325-40 MG tablet Take     1 tablet      every 4 hours       ONLY       if needed for Severe Headache 30 tablet 0  . Calcium Carb-Cholecalciferol (CALCIUM+D3 PO) Take 1 tablet by mouth daily.    . Cholecalciferol (VITAMIN D-3) 5000 units TABS Take 5,000 Units by mouth daily.    . fludrocortisone (FLORINEF) 0.1 MG tablet Take 2 tablets Daily for Low BP (Patient taking differently: Take 0.1 mg by mouth daily. Daily for Low BP) 180 tablet 0  . glucose blood test strip Check blood sugar 1 time daily-DX-E11.9 (Patient taking differently: 1 each by Other route as needed. Check blood sugar 1 time daily-DX-E11.9) 100 each 5  . levothyroxine (SYNTHROID) 25 MCG tablet TAKE 1  TABLET BY MOUTH EVERY DAY ON EMPTY STOMACH WITH ONLY WATER FOR 30 MINS 90 tablet 1  . meloxicam (MOBIC) 15 MG tablet Take  1/2 to 1 tablet  Daily  with Food  for Pain & Inflammation 90 tablet 1  . memantine (NAMENDA) 10 MG tablet TAKE 1 TABLET BY MOUTH TWICE A DAY 180 tablet 2  . Multiple Vitamin (MULTIVITAMIN WITH MINERALS) TABS tablet Take 1 tablet by mouth daily.    Marland Kitchen olmesartan (BENICAR) 40 MG tablet Take 40 mg by mouth daily as needed.    . pantoprazole (PROTONIX) 40 MG tablet Take      1 tablet      Daily       to Prevent Indigestion & Heartburn 90 tablet 3  . polyethylene glycol (MIRALAX / GLYCOLAX) packet Take 17 g by mouth daily. (Patient taking differently: Take 17 g by mouth 2 (two) times daily.) 28 each 1  . potassium chloride (KLOR-CON) 10 MEQ tablet Take 10 mEq by mouth daily. 1 Tablet Daily    . pravastatin (PRAVACHOL) 40 MG tablet Take     1 tablet     at Bedtime         for Cholesterol 90 tablet 1  . tamsulosin (FLOMAX) 0.4 MG CAPS capsule TAKE 1 TABLET 1 HOUR BEFORE BEDTIME FOR BLADDER EMPTYING 90 capsule 1  . vitamin B-12 (CYANOCOBALAMIN) 250 MCG tablet Take 250 mcg by mouth daily. 1 Tablet Daily    . Zinc 50 MG TABS Take 1 tablet by mouth daily.    Marland Kitchen amLODipine (NORVASC) 5 MG tablet TAKE 1 TABLET BY MOUTH DAILY FOR BP 90 tablet 3  . dexamethasone (DECADRON) 4 MG tablet Take 1 tab 3 x day - 3 days, then 2 x day - 3 days, then 1 tab daily 20 tablet 0  . ferrous sulfate 325 (65 FE) MG tablet Take 325 mg by mouth daily with breakfast.    . memantine (NAMENDA TITRATION PAK) tablet pack 5 mg/day for =1 week; 5 mg twice daily for =1 week; 15 mg/day given in 5 mg and 10 mg separated doses for =  1 week; then 10 mg twice daily 49 tablet 12   No current facility-administered medications on file prior to visit.    Allergies:   Allergies  Allergen Reactions  . Other Other (See Comments)    Pt reports allergic to a medication but unsure of name or type of med  . Cymbalta [Duloxetine  Hcl] Other (See Comments)    Dizziness, hallucinations.   . Keflex [Cephalexin] Other (See Comments)    dizziness  . Simvastatin Other (See Comments)    Joint pain  . Sudafed [Pseudoephedrine] Other (See Comments)    Dizziness    Physical Exam General: well developed, well nourished elderly Caucasian male, seated, in no evident distress Head: head normocephalic and atraumatic.   Neck: supple with no carotid or supraclavicular bruits Cardiovascular: regular rate and rhythm, no murmurs Musculoskeletal: no deformity Skin:  no rash/petichiae Vascular:  Normal pulses all extremities  Neurologic Exam Mental Status: Awake and fully alert. Oriented to place and time. Recent and remote memory intact. Attention span, concentration and fund of knowledge appropriate. Mood and affect appropriate.  Diminished recall 2 from below both at 150/3.  Able to name only 4 animals which can walk on 4 legs.  Clock drawing 3/4.  Mini-Mental status exam score 22/30 with deficits in attention and recall(unchanged from last visit) 6.  Mild difficulty with copying intersecting pentagons. Cranial Nerves: Fundoscopic exam not done. Pupils equal, briskly reactive to light. Extraocular movements full without nystagmus. Visual fields full to confrontation. Hearing diminished slightly bilaterally. Facial sensation intact.  Mild right nasolabial fold weakness., tongue, palate moves normally and symmetrically.  Motor: Normal bulk and tone. Normal strength in all tested extremity muscles except significant weakness of right hip muscles likely from pain with limitation of hip flexion and abduction and adduction only.. Sensory.:  Diminished touch , pinprick , position and vibratory sensation from knee down bilaterally..  Coordination: Rapid alternating movements normal in all extremities. Finger-to-nose and heel-to-shin performed accurately bilaterally. Gait and Station: Deferred as patient did not bring his walker and is in a  wheelchair.   Reflexes: 1+ and symmetric except both ankle jerks are depressed. Toes downgoing.      ASSESSMENT: 87 year old Caucasian male with transient episode of altered mental status with left upper extremity weakness possibly right brain subcortical TIA from small vessel disease in May 2021.Marland Kitchen  Prior history of right temporal infarct in September 2019 and left corpus callosal lacunar infarct in October 2019 and remote H chronic lacunar infarcts involving right basal ganglia, cerebellum and corona radiator.  Vascular risk factors of hypertension, diabetes, hyperlipidemia and cerebrovascular disease.  He also has chronic diabetic peripheral polyneuropathy.  Multiple right cerebellar and small left parietal embolic infarcts in July 2021 likely from fat embolism following hip surgery.  Patient mild vascular dementia which appears stable on Namenda.    PLAN: I had a long discussion with the patient and his wife regarding his recent stroke and worsening gait difficulties which are likely multifactorial due to combination of multiple strokes, recent hip surgery and underlying neuropathy and degenerative spine disease.  He also had worsening memory and cognitive impairment likely due to mild vascular dementia.  Recommend checking lab work for reversible causes of memory loss, EEG and a trial of Namenda for cognitive improvement.  Referral to outpatient physical and occupational therapy as  Outpatient.  Continue aspirin for stroke prevention and maintain aggressive risk factor modification with strict control of hypertension with blood pressure goal below 130/90, lipids with LDL  cholesterol goal below 70 mg percent and diabetes with hemoglobin A1c goal below 6.5%.  Return for follow-up in 3 months or call earlier if necessary. Greater than 50% time during this 45-minute prolonged visit was spent on counseling and coordination of care about TIA and stroke and answering questions about stroke prevention.  Antony Contras, MD  [facility] [address] [place], [place] [address]  Phone [phone] Fax [phone] Note: This document was prepared with digital dictation and possible smart phrase technology. Any transcriptional errors that result from this process are unintentional.

## 2020-09-17 ENCOUNTER — Encounter: Payer: Self-pay | Admitting: Specialist

## 2020-09-17 ENCOUNTER — Ambulatory Visit: Payer: Medicare Other | Admitting: Specialist

## 2020-09-17 ENCOUNTER — Other Ambulatory Visit: Payer: Self-pay

## 2020-09-17 VITALS — BP 134/86 | HR 83 | Ht 72.0 in | Wt 191.0 lb

## 2020-09-17 DIAGNOSIS — M4726 Other spondylosis with radiculopathy, lumbar region: Secondary | ICD-10-CM | POA: Diagnosis not present

## 2020-09-17 DIAGNOSIS — M4807 Spinal stenosis, lumbosacral region: Secondary | ICD-10-CM

## 2020-09-17 DIAGNOSIS — M1711 Unilateral primary osteoarthritis, right knee: Secondary | ICD-10-CM

## 2020-09-17 DIAGNOSIS — M4156 Other secondary scoliosis, lumbar region: Secondary | ICD-10-CM | POA: Diagnosis not present

## 2020-09-17 DIAGNOSIS — M21371 Foot drop, right foot: Secondary | ICD-10-CM | POA: Diagnosis not present

## 2020-09-17 MED ORDER — BUPIVACAINE HCL 0.5 % IJ SOLN
3.0000 mL | INTRAMUSCULAR | Status: AC | PRN
  Administered 2020-09-17: 3 mL via INTRA_ARTICULAR

## 2020-09-17 MED ORDER — METHYLPREDNISOLONE ACETATE 40 MG/ML IJ SUSP
40.0000 mg | INTRAMUSCULAR | Status: AC | PRN
  Administered 2020-09-17: 40 mg via INTRA_ARTICULAR

## 2020-09-17 NOTE — Patient Instructions (Signed)
Avoid bending, stooping and avoid lifting weights greater than 10 lbs. Avoid prolong standing and walking. Avoid frequent bending and stooping  No lifting greater than 10 lbs. May use ice or moist heat for pain. Weight loss is of benefit. Handicap license is approved. Dr. Romona Curls secretary/Assistant will call to arrange for epidural steroid injection  Call [facility] and request a visit to get AFO for right foot adjusted.

## 2020-09-17 NOTE — Progress Notes (Addendum)
Office Visit Note   Patient: [name]           Date of Birth: 1933/07/06           MRN: [id number] Visit Date: 09/17/2020              Requested by: [name], [address] [place],  [place] [id number] PCP: [name], MD   Assessment & Plan: Visit Diagnoses:  1. Foot drop, right   2. Spinal stenosis of lumbosacral region   3. Other spondylosis with radiculopathy, lumbar region   4. Other secondary scoliosis, lumbar region   5. Unilateral primary osteoarthritis, right knee     Plan: Avoid bending, stooping and avoid lifting weights greater than 10 lbs. Avoid prolong standing and walking. Avoid frequent bending and stooping  No lifting greater than 10 lbs. May use ice or moist heat for pain. Weight loss is of benefit. Handicap license is approved. Dr. Romona Curls secretary/Assistant will call to arrange for epidural steroid injection  Call [facility] and request a visit to get AFO for right foot adjusted.   Follow-Up Instructions: Return in about 4 weeks (around 10/15/2020).   Orders:  Orders Placed This Encounter  Procedures  . Large Joint Inj: R knee  . Ambulatory referral to Physical Medicine Rehab   Meds ordered this encounter  Medications  . methylPREDNISolone acetate (DEPO-MEDROL) injection 40 mg  . bupivacaine (MARCAINE) 0.5 % (with pres) injection 3 mL      Procedures: Large Joint Inj: R knee on 09/17/2020 11:57 AM Indications: pain Details: 25 G 1.5 in needle, anterolateral approach  Arthrogram: No  Medications: 40 mg methylPREDNISolone acetate 40 MG/ML; 3 mL bupivacaine 0.5 % Outcome: tolerated well, no immediate complications  Bandaud applied. Procedure, treatment alternatives, risks and benefits explained, specific risks discussed. Consent was given by the patient. Immediately prior to procedure a time out was called to verify the correct patient, procedure, equipment, support staff and site/side marked as required.  Patient was prepped and draped in the usual sterile fashion.       Clinical Data: No additional findings.   Subjective: Chief Complaint  Patient presents with  . Lower Back - Follow-up    87 year old male with history of scoliosis and spondylosis with right foraminal stenosis affecting the right L4 and L5 nerve roots. He fell last July and had a right intertrochanteric hip fracture that was treated with a IM nail He has persistent right hip weakness and right foot drop. MRI from last 01/2020 showed right L4 and L5 foramenal stenosis with impression on the right L4 and L5 nerve roots that likely accounts for the right foot DF weakness. He returns today having some persistent right sciatica and right hip pain. Over all he is felling better But has right leg weakness and has not been wearing the right AFO.    Review of Systems  Constitutional: Negative.   HENT: Negative.   Eyes: Negative.   Respiratory: Negative.   Cardiovascular: Negative.   Gastrointestinal: Negative.   Endocrine: Negative.   Genitourinary: Negative.   Musculoskeletal: Negative.   Skin: Negative.   Allergic/Immunologic: Negative.   Neurological: Negative.   Hematological: Negative.   Psychiatric/Behavioral: Negative.      Objective: Vital Signs: BP 134/86 (BP Location: Left Arm, Patient Position: Sitting)   Pulse 83   Ht 6' (1.829 m)   Wt 191 lb (86.6 kg)   BMI 25.90 kg/m   Physical Exam Constitutional:  Appearance: He is well-developed.  HENT:     Head: Normocephalic and atraumatic.  Eyes:     Pupils: Pupils are equal, round, and reactive to light.  Pulmonary:     Effort: Pulmonary effort is normal.     Breath sounds: Normal breath sounds.  Abdominal:     General: Bowel sounds are normal.     Palpations: Abdomen is soft.  Musculoskeletal:     Cervical back: Normal range of motion and neck supple.     Lumbar back: Negative right straight leg raise test and negative left straight leg  raise test.     Right knee:     Instability Tests: Medial McMurray test positive.  Skin:    General: Skin is warm and dry.  Neurological:     Mental Status: He is alert and oriented to person, place, and time.  Psychiatric:        Behavior: Behavior normal.        Thought Content: Thought content normal.        Judgment: Judgment normal.     Right Knee Exam   Tenderness  The patient is experiencing tenderness in the medial retinaculum and medial joint line.  Range of Motion  Extension:  -10 abnormal  Flexion:  100 abnormal   Tests  McMurray:  Medial - positive  Lachman:  Anterior - positive    Posterior - positive   Right Hip Exam   Tenderness  The patient is experiencing tenderness in the lateral and greater trochanter.  Range of Motion  Abduction: abnormal  Adduction: abnormal  Extension: abnormal  Flexion: abnormal  External rotation: abnormal  Internal rotation: abnormal   Muscle Strength  Abduction: 5/5  Adduction: 5/5  Flexion: 3/5   Tests  FABER: negative Ober: negative  Other  Erythema: absent Scars: present Sensation: normal Pulse: present   Back Exam   Tenderness  The patient is experiencing tenderness in the lumbar.  Range of Motion  Extension: abnormal  Flexion: normal  Lateral bend right: abnormal  Lateral bend left: normal  Rotation right: abnormal  Rotation left: normal   Muscle Strength  Right Quadriceps:  5/5  Left Quadriceps:  4/5  Right Hamstrings:  4/5  Left Hamstrings:  4/5   Tests  Straight leg raise right: negative Straight leg raise left: negative  Reflexes  Patellar: 1/4 Achilles: 2/4  Other  Toe walk: abnormal Heel walk: abnormal Sensation: normal Gait: abnormal   Comments:  Right foot drop, DF is trace of 5, tinel's at the right knee negative      Specialty Comments:  No specialty comments available.  Imaging: No results found.   PMFS History: Patient Active Problem List   Diagnosis  Date Noted  . Situational depression 01/22/2020  . Urinary frequency 01/22/2020  . Bacteremia due to Staphylococcus epidermidis 11/24/2019  . Stroke (cerebrum) ([place])   . Elevated troponin   . Closed right hip fracture ([place]) 11/15/2019  . Unilateral primary osteoarthritis, right knee 11/13/2019  . At high risk for falls 05/29/2019  . Chronic bilateral low back pain without sciatica 02/07/2019  . Statin intolerance 11/16/2018  . Hyperlipidemia, mixed 08/15/2018  . Low back pain 07/18/2018  . History of lacunar cerebrovascular accident (CVA) 02/12/2018  . Dizziness 02/12/2018  . Myofascial pain 12/18/2017  . Spondylosis without myelopathy or radiculopathy, lumbar region 12/18/2017  . Lumbar radiculopathy, right 12/18/2017  . Cholelithiasis 05/17/2017  . Sinus Bradycardia 05/17/2017  . Thoracic radiculopathy 04/12/2017  . Primary osteoarthritis of  right knee 12/14/2016  . DDD (degenerative disc disease), lumbar 10/10/2016  . Lumbar facet arthropathy 08/29/2016  . Idiopathic scoliosis 03/22/2016  . Diabetic neuropathy ([place]) 03/22/2016  . Dysautonomia orthostatic hypotension syndrome 02/25/2016  . CKD stage 3 due to type 2 diabetes mellitus ([place]) 02/19/2016  . Coronary artery disease involving coronary bypass graft of native heart without angina pectoris   . Diabetes mellitus type 2, diet-controlled ([place])   . 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 Medication management 08/11/2015  . 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 PVD (peripheral vascular disease) ([place]) 01/01/2014  . Hyperlipidemia associated with type 2 diabetes mellitus ([place]) 04/11/2013  . Supine hypertension   . Vitamin D deficiency   . Vitamin B 12 deficiency   . Coronary atherosclerosis- s/p PCI to LAD in 2009 and PCI to RCA in 2011 02/27/2009  . Abdominal aortic aneurysm ([place]) 02/27/2009  . GERD 02/27/2009   Past Medical History:  Diagnosis Date  . AAA (abdominal aortic aneurysm) ([place])   . 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 CAD  (coronary artery disease)    a. s/p CABGx4 in 07/2015.  . Carotid artery disease ([place])   . Colon polyps   . Diabetes ([place])   . Difficult intubation   . 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 Esophageal reflux   . High cholesterol   . History of IBS 02/27/2009  . Hypertension    pt denies, he says he has a h/o hypotension. If BP up he adjusts the [place]  . Jejunitis with partial SBO 05/20/2017  . Orthostatic hypotension   . Prostatitis   . Stroke ([place])   . TIA (transient ischemic attack)   . Upper GI bleed 06/24/2018  . Vitamin B 12 deficiency   . Vitamin D deficiency     Family History  Problem Relation Age of Onset  . Heart attack Father        died age 15  . Heart attack Brother        died age 28  . Anuerysm Brother        aortic  . Heart attack Sister        died age 55  . Colon cancer Sister   . Liver cancer Sister   . Diabetes Maternal Grandmother   . Arthritis Mother   . Dementia Mother   . Heart Problems Brother   . Heart Problems Brother   . Heart Problems Brother   . Heart Problems Brother   . Healthy Daughter   . Healthy Son   . Esophageal cancer Neg Hx   . Inflammatory bowel disease Neg Hx   . Pancreatic cancer Neg Hx   . Stomach cancer Neg Hx     Past Surgical History:  Procedure Laterality Date  . BIOPSY  06/25/2018   Procedure: BIOPSY;  Surgeon: Rush Landmark Telford Nab., MD;  Location: [facility];  Service: Gastroenterology;;  . BIOPSY  04/24/2019   Procedure: BIOPSY;  Surgeon: Irving Copas., MD;  Location: [facility];  Service: Gastroenterology;;  . BLADDER SURGERY  1969   traumatic pelvic fractures, urethral and bladder repair  . CARDIAC CATHETERIZATION N/A 07/01/2015   Procedure: Left Heart Cath and Coronary Angiography;  Surgeon: Wellington Hampshire, MD;  Location: [facility];  Service: Cardiovascular;  Laterality: N/A;  . COLON RESECTION N/A 05/17/2017   Procedure: DIAGNOSTIC LAPAROSCOPY,;  Surgeon: Leighton Ruff, MD;  Location:  [facility];  Service: General;  Laterality: N/A;  . COLONOSCOPY WITH PROPOFOL  N/A 04/24/2019   Procedure: COLONOSCOPY WITH PROPOFOL;  Surgeon: Rush Landmark Telford Nab., MD;  Location: [facility];  Service: Gastroenterology;  Laterality: N/A;  . CORONARY ANGIOPLASTY WITH STENT PLACEMENT    . CORONARY ARTERY BYPASS GRAFT N/A 07/06/2015   Procedure: CORONARY ARTERY BYPASS GRAFTING (CABG)x 4   utilizing the left internal mammary artery and endoscopically harvested bilateral  sapheneous vein.;  Surgeon: Ivin Poot, MD;  Location: [facility];  Service: Open Heart Surgery;  Laterality: N/A;  . ESOPHAGEAL DILATION  04/24/2019   Procedure: ESOPHAGEAL DILATION;  Surgeon: Rush Landmark Telford Nab., MD;  Location: [facility];  Service: Gastroenterology;;  . ESOPHAGOGASTRODUODENOSCOPY (EGD) WITH PROPOFOL N/A 06/25/2018   Procedure: ESOPHAGOGASTRODUODENOSCOPY (EGD) WITH PROPOFOL;  Surgeon: Irving Copas., MD;  Location: [facility];  Service: Gastroenterology;  Laterality: N/A;  . ESOPHAGOGASTRODUODENOSCOPY (EGD) WITH PROPOFOL N/A 04/24/2019   Procedure: ESOPHAGOGASTRODUODENOSCOPY (EGD) WITH PROPOFOL;  Surgeon: Rush Landmark Telford Nab., MD;  Location: [facility];  Service: Gastroenterology;  Laterality: N/A;  . FEMUR IM NAIL Right 11/16/2019   Procedure: RIGHT HIP INTRAMEDULLARY (IM) NAIL FEMORAL;  Surgeon: Meredith Pel, MD;  Location: [facility];  Service: Orthopedics;  Laterality: Right;  . HEMOSTASIS CLIP PLACEMENT  04/24/2019   Procedure: HEMOSTASIS CLIP PLACEMENT;  Surgeon: Irving Copas., MD;  Location: [facility];  Service: Gastroenterology;;  . KNEE SURGERY    . LOOP RECORDER INSERTION N/A 02/19/2018   Procedure: LOOP RECORDER INSERTION;  Surgeon: Deboraha Sprang, MD;  Location: [facility];  Service: Cardiovascular;  Laterality: N/A;  . POLYPECTOMY  04/24/2019   Procedure: POLYPECTOMY;  Surgeon: Rush Landmark Telford Nab., MD;  Location: [facility];  Service: Gastroenterology;;   . Lia Foyer LIFTING INJECTION  04/24/2019   Procedure: SUBMUCOSAL LIFTING INJECTION;  Surgeon: Irving Copas., MD;  Location: [facility];  Service: Gastroenterology;;  . TEE WITHOUT CARDIOVERSION N/A 07/06/2015   Procedure: TRANSESOPHAGEAL ECHOCARDIOGRAM (TEE);  Surgeon: Ivin Poot, MD;  Location: [facility];  Service: Open Heart Surgery;  Laterality: N/A;   Social History   Occupational History  . Occupation: Retired  Tobacco Use  . Smoking status: Former Smoker    Quit date: 07/16/1963    Years since quitting: 57.2  . Smokeless tobacco: Former Systems developer    Types: [place] date: 1989  Media planner  . Vaping Use: Never used  Substance and Sexual Activity  . Alcohol use: No  . Drug use: No  . Sexual activity: Not Currently

## 2020-09-21 ENCOUNTER — Ambulatory Visit: Payer: Medicare Other

## 2020-09-21 DIAGNOSIS — R410 Disorientation, unspecified: Secondary | ICD-10-CM | POA: Diagnosis not present

## 2020-09-21 DIAGNOSIS — R413 Other amnesia: Secondary | ICD-10-CM

## 2020-09-24 ENCOUNTER — Telehealth: Payer: Self-pay | Admitting: Physical Medicine and Rehabilitation

## 2020-09-24 ENCOUNTER — Ambulatory Visit (INDEPENDENT_AMBULATORY_CARE_PROVIDER_SITE_OTHER): Payer: Medicare Other

## 2020-09-24 DIAGNOSIS — I6310 Cerebral infarction due to embolism of unspecified precerebral artery: Secondary | ICD-10-CM | POA: Diagnosis not present

## 2020-09-24 LAB — CUP PACEART REMOTE DEVICE CHECK
Date Time Interrogation Session: 20220525041242
Implantable Pulse Generator Implant Date: 20191021

## 2020-09-24 NOTE — Telephone Encounter (Signed)
Called and sch 6/7

## 2020-09-24 NOTE — Telephone Encounter (Signed)
Patient's wife Estill Bamberg returned call to [facility] to set appt for pt. Please call back at [phone].

## 2020-10-02 ENCOUNTER — Telehealth: Payer: Self-pay | Admitting: Neurology

## 2020-10-02 NOTE — Telephone Encounter (Signed)
Wife(on DPR) is asking for a call with results to pt's EEG

## 2020-10-05 ENCOUNTER — Encounter (HOSPITAL_BASED_OUTPATIENT_CLINIC_OR_DEPARTMENT_OTHER): Payer: Self-pay | Admitting: Emergency Medicine

## 2020-10-05 ENCOUNTER — Emergency Department (HOSPITAL_BASED_OUTPATIENT_CLINIC_OR_DEPARTMENT_OTHER): Payer: Medicare Other

## 2020-10-05 ENCOUNTER — Emergency Department (HOSPITAL_BASED_OUTPATIENT_CLINIC_OR_DEPARTMENT_OTHER): Payer: Medicare Other | Admitting: Radiology

## 2020-10-05 ENCOUNTER — Other Ambulatory Visit: Payer: Self-pay

## 2020-10-05 ENCOUNTER — Other Ambulatory Visit: Payer: Self-pay | Admitting: Internal Medicine

## 2020-10-05 ENCOUNTER — Observation Stay (HOSPITAL_BASED_OUTPATIENT_CLINIC_OR_DEPARTMENT_OTHER)
Admission: EM | Admit: 2020-10-05 | Discharge: 2020-10-07 | Disposition: A | Discharge status: Home / Self Care | Payer: Medicare Other | Source: Home / Self Care | Attending: Emergency Medicine | Admitting: Emergency Medicine

## 2020-10-05 DIAGNOSIS — D631 Anemia in chronic kidney disease: Secondary | ICD-10-CM | POA: Diagnosis not present

## 2020-10-05 DIAGNOSIS — E119 Type 2 diabetes mellitus without complications: Secondary | ICD-10-CM

## 2020-10-05 DIAGNOSIS — E782 Mixed hyperlipidemia: Secondary | ICD-10-CM | POA: Diagnosis not present

## 2020-10-05 DIAGNOSIS — I6523 Occlusion and stenosis of bilateral carotid arteries: Secondary | ICD-10-CM | POA: Diagnosis not present

## 2020-10-05 DIAGNOSIS — Z951 Presence of aortocoronary bypass graft: Secondary | ICD-10-CM | POA: Insufficient documentation

## 2020-10-05 DIAGNOSIS — N179 Acute kidney failure, unspecified: Secondary | ICD-10-CM | POA: Diagnosis not present

## 2020-10-05 DIAGNOSIS — R55 Syncope and collapse: Secondary | ICD-10-CM | POA: Diagnosis not present

## 2020-10-05 DIAGNOSIS — Z79899 Other long term (current) drug therapy: Secondary | ICD-10-CM | POA: Insufficient documentation

## 2020-10-05 DIAGNOSIS — Y9389 Activity, other specified: Secondary | ICD-10-CM | POA: Insufficient documentation

## 2020-10-05 DIAGNOSIS — E1151 Type 2 diabetes mellitus with diabetic peripheral angiopathy without gangrene: Secondary | ICD-10-CM | POA: Diagnosis not present

## 2020-10-05 DIAGNOSIS — Z9181 History of falling: Secondary | ICD-10-CM | POA: Diagnosis not present

## 2020-10-05 DIAGNOSIS — M549 Dorsalgia, unspecified: Secondary | ICD-10-CM | POA: Diagnosis not present

## 2020-10-05 DIAGNOSIS — E1122 Type 2 diabetes mellitus with diabetic chronic kidney disease: Secondary | ICD-10-CM | POA: Diagnosis present

## 2020-10-05 DIAGNOSIS — G909 Disorder of the autonomic nervous system, unspecified: Secondary | ICD-10-CM | POA: Diagnosis not present

## 2020-10-05 DIAGNOSIS — Z20822 Contact with and (suspected) exposure to covid-19: Secondary | ICD-10-CM | POA: Insufficient documentation

## 2020-10-05 DIAGNOSIS — M545 Low back pain, unspecified: Secondary | ICD-10-CM | POA: Insufficient documentation

## 2020-10-05 DIAGNOSIS — N183 Chronic kidney disease, stage 3 unspecified: Secondary | ICD-10-CM | POA: Insufficient documentation

## 2020-10-05 DIAGNOSIS — W1839XA Other fall on same level, initial encounter: Secondary | ICD-10-CM | POA: Diagnosis not present

## 2020-10-05 DIAGNOSIS — I714 Abdominal aortic aneurysm, without rupture, unspecified: Secondary | ICD-10-CM

## 2020-10-05 DIAGNOSIS — I2510 Atherosclerotic heart disease of native coronary artery without angina pectoris: Secondary | ICD-10-CM | POA: Diagnosis present

## 2020-10-05 DIAGNOSIS — Z87891 Personal history of nicotine dependence: Secondary | ICD-10-CM | POA: Insufficient documentation

## 2020-10-05 DIAGNOSIS — Z66 Do not resuscitate: Secondary | ICD-10-CM | POA: Diagnosis not present

## 2020-10-05 DIAGNOSIS — I723 Aneurysm of iliac artery: Secondary | ICD-10-CM

## 2020-10-05 DIAGNOSIS — E039 Hypothyroidism, unspecified: Secondary | ICD-10-CM | POA: Diagnosis not present

## 2020-10-05 DIAGNOSIS — I160 Hypertensive urgency: Secondary | ICD-10-CM | POA: Insufficient documentation

## 2020-10-05 DIAGNOSIS — E559 Vitamin D deficiency, unspecified: Secondary | ICD-10-CM | POA: Diagnosis not present

## 2020-10-05 DIAGNOSIS — I129 Hypertensive chronic kidney disease with stage 1 through stage 4 chronic kidney disease, or unspecified chronic kidney disease: Secondary | ICD-10-CM | POA: Insufficient documentation

## 2020-10-05 DIAGNOSIS — M5136 Other intervertebral disc degeneration, lumbar region: Secondary | ICD-10-CM | POA: Diagnosis present

## 2020-10-05 DIAGNOSIS — E538 Deficiency of other specified B group vitamins: Secondary | ICD-10-CM | POA: Diagnosis not present

## 2020-10-05 DIAGNOSIS — S20222A Contusion of left back wall of thorax, initial encounter: Secondary | ICD-10-CM | POA: Diagnosis not present

## 2020-10-05 DIAGNOSIS — I951 Orthostatic hypotension: Secondary | ICD-10-CM | POA: Diagnosis not present

## 2020-10-05 DIAGNOSIS — K589 Irritable bowel syndrome without diarrhea: Secondary | ICD-10-CM | POA: Diagnosis not present

## 2020-10-05 DIAGNOSIS — K219 Gastro-esophageal reflux disease without esophagitis: Secondary | ICD-10-CM | POA: Diagnosis present

## 2020-10-05 DIAGNOSIS — I10 Essential (primary) hypertension: Secondary | ICD-10-CM

## 2020-10-05 DIAGNOSIS — R030 Elevated blood-pressure reading, without diagnosis of hypertension: Secondary | ICD-10-CM

## 2020-10-05 DIAGNOSIS — Y92010 Kitchen of single-family (private) house as the place of occurrence of the external cause: Secondary | ICD-10-CM | POA: Diagnosis not present

## 2020-10-05 DIAGNOSIS — Z7982 Long term (current) use of aspirin: Secondary | ICD-10-CM | POA: Insufficient documentation

## 2020-10-05 DIAGNOSIS — F0150 Vascular dementia without behavioral disturbance: Secondary | ICD-10-CM | POA: Diagnosis not present

## 2020-10-05 DIAGNOSIS — N1832 Chronic kidney disease, stage 3b: Secondary | ICD-10-CM | POA: Diagnosis not present

## 2020-10-05 DIAGNOSIS — E1169 Type 2 diabetes mellitus with other specified complication: Secondary | ICD-10-CM | POA: Diagnosis not present

## 2020-10-05 LAB — CBC WITH DIFFERENTIAL/PLATELET
Abs Immature Granulocytes: 0.02 10*3/uL (ref 0.00–0.07)
Basophils Absolute: 0 10*3/uL (ref 0.0–0.1)
Basophils Relative: 1 %
Eosinophils Absolute: 0.3 10*3/uL (ref 0.0–0.5)
Eosinophils Relative: 4 %
HCT: 35.4 % — ABNORMAL LOW (ref 39.0–52.0)
Hemoglobin: 11.3 g/dL — ABNORMAL LOW (ref 13.0–17.0)
Immature Granulocytes: 0 %
Lymphocytes Relative: 28 %
Lymphs Abs: 1.9 10*3/uL (ref 0.7–4.0)
MCH: 31.9 pg (ref 26.0–34.0)
MCHC: 31.9 g/dL (ref 30.0–36.0)
MCV: 100 fL (ref 80.0–100.0)
Monocytes Absolute: 0.4 10*3/uL (ref 0.1–1.0)
Monocytes Relative: 6 %
Neutro Abs: 4 10*3/uL (ref 1.7–7.7)
Neutrophils Relative %: 61 %
Platelets: 128 10*3/uL — ABNORMAL LOW (ref 150–400)
RBC: 3.54 MIL/uL — ABNORMAL LOW (ref 4.22–5.81)
RDW: 13.6 % (ref 11.5–15.5)
WBC: 6.6 10*3/uL (ref 4.0–10.5)
nRBC: 0 % (ref 0.0–0.2)

## 2020-10-05 LAB — COMPREHENSIVE METABOLIC PANEL
ALT: 6 U/L (ref 0–44)
AST: 12 U/L — ABNORMAL LOW (ref 15–41)
Albumin: 3.7 g/dL (ref 3.5–5.0)
Alkaline Phosphatase: 103 U/L (ref 38–126)
Anion gap: 8 (ref 5–15)
BUN: 32 mg/dL — ABNORMAL HIGH (ref 8–23)
CO2: 29 mmol/L (ref 22–32)
Calcium: 9.1 mg/dL (ref 8.9–10.3)
Chloride: 102 mmol/L (ref 98–111)
Creatinine, Ser: 2.03 mg/dL — ABNORMAL HIGH (ref 0.61–1.24)
GFR, Estimated: 32 mL/min — ABNORMAL LOW (ref >60)
Glucose, Bld: 107 mg/dL — ABNORMAL HIGH (ref 70–99)
Potassium: 4.4 mmol/L (ref 3.5–5.1)
Sodium: 139 mmol/L (ref 135–145)
Total Bilirubin: 0.5 mg/dL (ref 0.3–1.2)
Total Protein: 6 g/dL — ABNORMAL LOW (ref 6.5–8.1)

## 2020-10-05 LAB — CBC
HCT: 36.2 % — ABNORMAL LOW (ref 39.0–52.0)
Hemoglobin: 11.4 g/dL — ABNORMAL LOW (ref 13.0–17.0)
MCH: 31.8 pg (ref 26.0–34.0)
MCHC: 31.5 g/dL (ref 30.0–36.0)
MCV: 100.8 fL — ABNORMAL HIGH (ref 80.0–100.0)
Platelets: 143 10*3/uL — ABNORMAL LOW (ref 150–400)
RBC: 3.59 MIL/uL — ABNORMAL LOW (ref 4.22–5.81)
RDW: 13.6 % (ref 11.5–15.5)
WBC: 8.4 10*3/uL (ref 4.0–10.5)
nRBC: 0 % (ref 0.0–0.2)

## 2020-10-05 LAB — RESP PANEL BY RT-PCR (FLU A&B, COVID) ARPGX2
Influenza A by PCR: NEGATIVE
Influenza B by PCR: NEGATIVE
SARS Coronavirus 2 by RT PCR: NEGATIVE

## 2020-10-05 LAB — PROTIME-INR
INR: 1 (ref 0.8–1.2)
Prothrombin Time: 13.4 seconds (ref 11.4–15.2)

## 2020-10-05 MED ORDER — IOHEXOL 350 MG/ML SOLN
75.0000 mL | Freq: Once | INTRAVENOUS | Status: AC | PRN
  Administered 2020-10-05: 75 mL via INTRAVENOUS

## 2020-10-05 MED ORDER — SODIUM CHLORIDE 0.9 % IV BOLUS
500.0000 mL | Freq: Once | INTRAVENOUS | Status: AC
  Administered 2020-10-05: 500 mL via INTRAVENOUS

## 2020-10-05 MED ORDER — ENOXAPARIN SODIUM 40 MG/0.4ML IJ SOSY
40.0000 mg | PREFILLED_SYRINGE | Freq: Every day | INTRAMUSCULAR | Status: DC
  Administered 2020-10-06 (×2): 40 mg via SUBCUTANEOUS
  Filled 2020-10-05 (×2): qty 0.4

## 2020-10-05 MED ORDER — ACETAMINOPHEN 325 MG PO TABS
650.0000 mg | ORAL_TABLET | Freq: Four times a day (QID) | ORAL | Status: DC | PRN
  Administered 2020-10-06 – 2020-10-07 (×2): 650 mg via ORAL
  Filled 2020-10-05 (×2): qty 2

## 2020-10-05 MED ORDER — FENTANYL CITRATE (PF) 100 MCG/2ML IJ SOLN
50.0000 ug | Freq: Once | INTRAMUSCULAR | Status: AC
  Administered 2020-10-05: 50 ug via INTRAVENOUS
  Filled 2020-10-05: qty 2

## 2020-10-05 MED ORDER — MEMANTINE HCL 10 MG PO TABS
10.0000 mg | ORAL_TABLET | Freq: Two times a day (BID) | ORAL | Status: DC
  Administered 2020-10-06 – 2020-10-07 (×3): 10 mg via ORAL
  Filled 2020-10-05 (×3): qty 1

## 2020-10-05 MED ORDER — PANTOPRAZOLE SODIUM 40 MG PO TBEC
40.0000 mg | DELAYED_RELEASE_TABLET | Freq: Every day | ORAL | Status: DC
  Administered 2020-10-06 – 2020-10-07 (×2): 40 mg via ORAL
  Filled 2020-10-05 (×2): qty 1

## 2020-10-05 MED ORDER — ONDANSETRON HCL 4 MG PO TABS: 4.0000 mg | ORAL_TABLET | Freq: Four times a day (QID) | ORAL | Status: DC | PRN

## 2020-10-05 MED ORDER — LABETALOL HCL 5 MG/ML IV SOLN
10.0000 mg | Freq: Once | INTRAVENOUS | Status: AC
  Administered 2020-10-05: 10 mg via INTRAVENOUS
  Filled 2020-10-05: qty 4

## 2020-10-05 MED ORDER — ONDANSETRON HCL 4 MG/2ML IJ SOLN: 4.0000 mg | Freq: Four times a day (QID) | INTRAMUSCULAR | Status: DC | PRN

## 2020-10-05 MED ORDER — IRBESARTAN 300 MG PO TABS
300.0000 mg | ORAL_TABLET | Freq: Every day | ORAL | Status: DC
  Administered 2020-10-06 – 2020-10-07 (×2): 300 mg via ORAL
  Filled 2020-10-05 (×2): qty 1

## 2020-10-05 MED ORDER — ACETAMINOPHEN 650 MG RE SUPP: 650.0000 mg | Freq: Four times a day (QID) | RECTAL | Status: DC | PRN

## 2020-10-05 MED ORDER — HYDRALAZINE HCL 20 MG/ML IJ SOLN
20.0000 mg | Freq: Once | INTRAMUSCULAR | Status: AC
  Administered 2020-10-05: 20 mg via INTRAVENOUS
  Filled 2020-10-05: qty 1

## 2020-10-05 MED ORDER — INSULIN ASPART 100 UNIT/ML IJ SOLN
0.0000 [IU] | Freq: Three times a day (TID) | INTRAMUSCULAR | Status: DC
  Administered 2020-10-06: 2 [IU] via SUBCUTANEOUS

## 2020-10-05 MED ORDER — FENTANYL CITRATE (PF) 100 MCG/2ML IJ SOLN
50.0000 ug | INTRAMUSCULAR | Status: DC | PRN
  Administered 2020-10-05 – 2020-10-06 (×4): 50 ug via INTRAVENOUS
  Filled 2020-10-05 (×4): qty 2

## 2020-10-05 MED ORDER — AMLODIPINE BESYLATE 5 MG PO TABS
5.0000 mg | ORAL_TABLET | Freq: Every day | ORAL | Status: DC
  Administered 2020-10-06: 5 mg via ORAL
  Filled 2020-10-05: qty 1

## 2020-10-05 MED ORDER — METOPROLOL TARTRATE 5 MG/5ML IV SOLN
5.0000 mg | Freq: Four times a day (QID) | INTRAVENOUS | Status: DC | PRN
  Administered 2020-10-06 (×2): 5 mg via INTRAVENOUS
  Filled 2020-10-05 (×2): qty 5

## 2020-10-05 MED ORDER — INSULIN ASPART 100 UNIT/ML IJ SOLN: 0.0000 [IU] | Freq: Every day | INTRAMUSCULAR | Status: DC

## 2020-10-05 MED ORDER — HYDRALAZINE HCL 20 MG/ML IJ SOLN
10.0000 mg | Freq: Once | INTRAMUSCULAR | Status: AC
  Administered 2020-10-05: 10 mg via INTRAVENOUS
  Filled 2020-10-05: qty 1

## 2020-10-05 MED ORDER — LEVOTHYROXINE SODIUM 25 MCG PO TABS
25.0000 ug | ORAL_TABLET | Freq: Every day | ORAL | Status: DC
  Administered 2020-10-06 – 2020-10-07 (×2): 25 ug via ORAL
  Filled 2020-10-05 (×2): qty 1

## 2020-10-05 NOTE — Progress Notes (Signed)
Signout obtained from Dr. Harrell Gave Tegeler. [name] is an 87 year old man who presented to the [facility] emergency room with severe low back pain.  History significant for recent fall from his scooter 2 days ago.  He was found to have hypertensive urgency with BP as high as 200/103.  He was given IV labetalol for hypertension and IV fentanyl for pain.  Oxygen saturation briefly dropped to 89% on room air following IV fentanyl.  CT of the chest also showed AAA measuring about 4.5 cm.  He said he spoke to vascular surgeon, Dr. Stanford Breed, who recommended admission to the hospital because of AAA and low back pain.  Vascular surgeon will consult on the patient to discuss AAA surgery.

## 2020-10-05 NOTE — ED Provider Notes (Signed)
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 relaxant and Lidoderm patches and have him follow-up with his PCP.  12:39 PM On reassessment, blood pressure is now 395 systolic.  He is still having intermittent sharp back pain but still denies any abdominal pain.  I was able to palpate DP and PT pulses in both of his legs however this intermittent issue is back pain with elevated blood pressure and known AAA I do feel we need to rule out acute dissection.  We will get screening labs, a dissection study, and will give the patient blood pressure medicine.   soft tissue muscle spasm and injury is still a possibility from this fall however we need to make sure his aorta is not causing this pain today.   2:54 PM CT scan returned and does not show evidence of acute aortic dissection but does show aneurysm still present.  When  compared to most recent aneurysm size, it does appear slightly bigger.  Patient is still having blood pressures over 200 so a second dose of labetalol was given.  Blood pressure is now just under 200 but he still having sharp intermittent pains.  I spoke with vascular surgery and they do suspect he may have a symptomatic aorta given the growth in the aorta and the sharp pain in his back potentially related to the elevated blood pressures.  They feel it is most appropriate to have him admitted to medicine service and they will see him in consultation to discuss if he needs aortic repair.  As blood pressure is improving with IV labetalol intermittently, will call initially a medicine service for admission.   Final Clinical Impression(s) / ED Diagnoses Final diagnoses:  Acute right-sided low back pain without sciatica  Abdominal aortic aneurysm (AAA) without rupture (HCC)  Elevated blood pressure reading  Fall from motorized mobility scooter, initial encounter    Clinical Impression: 1. Acute right-sided low back pain without sciatica   2. Abdominal aortic aneurysm (AAA) without rupture (HCC)   3. Elevated blood pressure reading   4. Fall from motorized mobility scooter, initial encounter     Disposition: Admit  This note was prepared with assistance of Systems analyst. Occasional wrong-word or sound-a-like substitutions may have occurred due to the inherent limitations of voice recognition software.     Annaelle Kasel, Gwenyth Allegra, MD 10/05/20 1534

## 2020-10-05 NOTE — ED Notes (Signed)
MD notified of elevated BP.

## 2020-10-05 NOTE — Consult Note (Signed)
[facility] [place]  ASSESSMENT / PLAN: 87 y.o. male with lumbar pain in setting of hypertension and small (36mm) infrarenal abdominal aortic aneurysm; bilateral common iliac artery aneurysms (~74mm); bilateral iliac artery aneurysms (~77mm). Recent CTA is not optimally timed to best evaluate the pelvic aneurysms. I see no evidence of impending rupture on the CTA (no irregularity or disruption of calcified aortic wall) to mandate emergent repair. I think his back pain and hypertension can be explained by his recent fall. A MRI of the lumbar spine may help [place] sort this out. If his pain does not improve or if MRI of lumbar spine is not suggestive of musculoskeletal cause of symptoms, we will plan to repair this aneurysm this admission.   CHIEF COMPLAINT: back pain  HISTORY OF PRESENT ILLNESS: [name] is a 87 y.o. male who presents to the hospital for evaluation of back pain.  He reported to the [facility] staff he was riding on a scooter on Saturday afternoon when he fell.  He he developed right rib and back pain afterwards.  He minimizes the trauma on my evaluation.  His history is significant for multiple medical problems.  He recently saw Dr. 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  He seems mildly confused on my exam.  He reports no abdominal pain.  He has never had symptoms like this before.  He has a known history of abdominal aortic aneurysm.  Past Medical History:  Diagnosis Date  . AAA (abdominal aortic aneurysm) ([place])   . Aneurysm of iliac artery (HCC)   . CAD (coronary artery disease)    a. s/p CABGx4 in 07/2015.  . Carotid artery disease ([place])   . Colon polyps   . Diabetes ([place])   . Difficult intubation   . 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 Esophageal reflux   . High cholesterol   . History of IBS 02/27/2009  . Hypertension    pt denies, he says he has a h/o hypotension. If BP up he  adjusts the [place]  . Jejunitis with partial SBO 05/20/2017  . Orthostatic hypotension   . Prostatitis   . Stroke ([place])   . TIA (transient ischemic attack)   . Upper GI bleed 06/24/2018  . Vitamin B 12 deficiency   . Vitamin D deficiency     Past Surgical History:  Procedure Laterality Date  . BIOPSY  06/25/2018   Procedure: BIOPSY;  Surgeon: Rush Landmark Telford Nab., MD;  Location: [facility];  Service: Gastroenterology;;  . BIOPSY  04/24/2019   Procedure: BIOPSY;  Surgeon: Irving Copas., MD;  Location: [facility];  Service: Gastroenterology;;  . BLADDER SURGERY  1969   traumatic pelvic fractures, urethral and bladder repair  . CARDIAC CATHETERIZATION N/A 07/01/2015   Procedure: Left Heart Cath and Coronary Angiography;  Surgeon: Wellington Hampshire, MD;  Location: [facility];  Service: Cardiovascular;  Laterality: N/A;  . COLON RESECTION N/A 05/17/2017   Procedure: DIAGNOSTIC LAPAROSCOPY,;  Surgeon: Leighton Ruff, MD;  Location: [facility];  Service: General;  Laterality: N/A;  . COLONOSCOPY WITH PROPOFOL N/A 04/24/2019   Procedure: COLONOSCOPY WITH PROPOFOL;  Surgeon: Irving Copas., MD;  Location: [facility];  Service: Gastroenterology;  Laterality: N/A;  . CORONARY ANGIOPLASTY WITH STENT PLACEMENT    . CORONARY ARTERY BYPASS GRAFT N/A 07/06/2015   Procedure: CORONARY ARTERY BYPASS GRAFTING (CABG)x 4   utilizing the left internal mammary artery and endoscopically  harvested bilateral  sapheneous vein.;  Surgeon: Ivin Poot, MD;  Location: [facility];  Service: Open Heart Surgery;  Laterality: N/A;  . ESOPHAGEAL DILATION  04/24/2019   Procedure: ESOPHAGEAL DILATION;  Surgeon: Rush Landmark Telford Nab., MD;  Location: [facility];  Service: Gastroenterology;;  . ESOPHAGOGASTRODUODENOSCOPY (EGD) WITH PROPOFOL N/A 06/25/2018   Procedure: ESOPHAGOGASTRODUODENOSCOPY (EGD) WITH PROPOFOL;  Surgeon: Irving Copas., MD;  Location: [facility];  Service:  Gastroenterology;  Laterality: N/A;  . ESOPHAGOGASTRODUODENOSCOPY (EGD) WITH PROPOFOL N/A 04/24/2019   Procedure: ESOPHAGOGASTRODUODENOSCOPY (EGD) WITH PROPOFOL;  Surgeon: Rush Landmark Telford Nab., MD;  [facility];  Service: Gastroenterology;  Laterality: N/A;  . FEMUR IM NAIL Right 11/16/2019   Procedure: RIGHT HIP INTRAMEDULLARY (IM) NAIL FEMORAL;  Surgeon: Meredith Pel, MD;  Location: [facility];  Service: Orthopedics;  Laterality: Right;  . HEMOSTASIS CLIP PLACEMENT  04/24/2019   Procedure: HEMOSTASIS CLIP PLACEMENT;  Surgeon: Irving Copas., MD;  Location: [facility];  Service: Gastroenterology;;  . KNEE SURGERY    . LOOP RECORDER INSERTION N/A 02/19/2018   Procedure: LOOP RECORDER INSERTION;  Surgeon: Deboraha Sprang, MD;  Location: [facility];  Service: Cardiovascular;  Laterality: N/A;  . POLYPECTOMY  04/24/2019   Procedure: POLYPECTOMY;  Surgeon: Rush Landmark Telford Nab., MD;  Location: [facility];  Service: Gastroenterology;;  . Lia Foyer LIFTING INJECTION  04/24/2019   Procedure: SUBMUCOSAL LIFTING INJECTION;  Surgeon: Irving Copas., MD;  Location[facility];  Service: Gastroenterology;;  . TEE WITHOUT CARDIOVERSION N/A 07/06/2015   Procedure: TRANSESOPHAGEAL ECHOCARDIOGRAM (TEE);  Surgeon: Ivin Poot, MD;  Location: [facility];  Service: Open Heart Surgery;  Laterality: N/A;    Family History  Problem Relation Age of Onset  . Heart attack Father        died age 14  . Heart attack Brother        died age 70  . Anuerysm Brother        aortic  . Heart attack Sister        died age 101  . Colon cancer Sister   . Liver cancer Sister   . Diabetes Maternal Grandmother   . Arthritis Mother   . Dementia Mother   . Heart Problems Brother   . Heart Problems Brother   . Heart Problems Brother   . Heart Problems Brother   . Healthy Daughter   . Healthy Son   . Esophageal cancer Neg Hx   . Inflammatory bowel disease Neg Hx   .  Pancreatic cancer Neg Hx   . Stomach cancer Neg Hx     Social History   Socioeconomic History  . Marital status: Married    Spouse name: Estill Bamberg  . Number of children: 2  . Years of education: Not on file  . Highest education level: High school graduate  Occupational History  . Occupation: Retired  Tobacco Use  . Smoking status: Former Smoker    Quit date: 07/16/1963    Years since quitting: 57.2  . Smokeless tobacco: Former Systems developer    Types: [place] date: 1989  Media planner  . Vaping Use: Never used  Substance and Sexual Activity  . Alcohol use: No  . Drug use: No  . Sexual activity: Not Currently  Other Topics Concern  . Not on file  Social History Narrative   Lives at home with his wife Estill Bamberg   Right handed   No caffeine   Social Determinants of Health  Financial Resource Strain: Not on file  Food Insecurity: Not on file  Transportation Needs: Not on file  Physical Activity: Not on file  Stress: Not on file  Social Connections: Not on file  Intimate Partner Violence: Not on file    Allergies  Allergen Reactions  . Other Other (See Comments)    Pt reports allergic to a medication but unsure of name or type of med  . Cymbalta [Duloxetine Hcl] Other (See Comments)    Dizziness, hallucinations.   . Keflex [Cephalexin] Other (See Comments)    dizziness  . Simvastatin Other (See Comments)    Joint pain  . Sudafed [Pseudoephedrine] Other (See Comments)    Dizziness    Current Facility-Administered Medications  Medication Dose Route Frequency Provider Last Rate Last Admin  . fentaNYL (SUBLIMAZE) injection 50 mcg  50 mcg Intravenous Q2H PRN Lucrezia Starch, MD   50 mcg at 10/05/20 2059    REVIEW OF SYSTEMS:  [X]  denotes positive finding, [ ]  denotes negative finding Cardiac  Comments:  Chest pain or chest pressure:    Shortness of breath upon exertion:    Short of breath when lying flat:    Irregular heart rhythm:        Vascular    Pain in calf,  thigh, or hip brought on by ambulation:    Pain in feet at night that wakes you up from your sleep:     Blood clot in your veins:    Leg swelling:         Pulmonary    Oxygen at home:    Productive cough:     Wheezing:         Neurologic    Sudden weakness in arms or legs:     Sudden numbness in arms or legs:     Sudden onset of difficulty speaking or slurred speech:    Temporary loss of vision in one eye:     Problems with dizziness:         Gastrointestinal    Blood in stool:     Vomited blood:         Genitourinary    Burning when urinating:     Blood in urine:        Psychiatric    Major depression:         Hematologic    Bleeding problems:    Problems with blood clotting too easily:        Skin    Rashes or ulcers:        Constitutional    Fever or chills:      PHYSICAL EXAM  Vitals:   10/05/20 1815 10/05/20 1930 10/05/20 1945 10/05/20 2158  BP: (!) 180/88 130/70 125/63   Pulse:      Resp:      Temp:    97.8 F (36.6 C)  TempSrc:    Oral  SpO2:      Weight:      Height:        Constitutional: elderly appearing. no distress. Appears well nourished.  Neurologic: CN intact. no focal findings. no sensory loss. Psychiatric: Mood and affect symmetric and appropriate. Eyes: No icterus. No conjunctival pallor. Ears, nose, throat: mucous membranes moist. Midline trachea.  Cardiac: regular rate and rhythm.  Respiratory: unlabored. Abdominal: soft, non-tender, non-distended.  Peripheral vascular:  2+ femoral pulses  Prominent popliteal pulses bilaterally Extremity: No edema. No cyanosis. No pallor.  Skin: No gangrene. No ulceration.  Lymphatic:  No Stemmer's sign. No palpable lymphadenopathy.  PERTINENT LABORATORY AND RADIOLOGIC DATA  Most recent CBC CBC Latest Ref Rng & Units 10/05/2020 06/09/2020 03/05/2020  WBC 4.0 - 10.5 K/uL 6.6 6.9 8.6  Hemoglobin 13.0 - 17.0 g/dL 11.3(L) 11.1(L) 11.4(L)  Hematocrit 39.0 - 52.0 % 35.4(L) 34.5(L) 34.8(L)  Platelets  150 - 400 K/uL 128(L) 175 195     Most recent CMP CMP Latest Ref Rng & Units 10/05/2020 06/09/2020 03/05/2020  Glucose 70 - 99 mg/dL 107(H) 115(H) 90  BUN 8 - 23 mg/dL 32(H) 28(H) 23  Creatinine 0.61 - 1.24 mg/dL 2.03(H) 1.73(H) 1.45(H)  Sodium 135 - 145 mmol/L 139 148(H) 143  Potassium 3.5 - 5.1 mmol/L 4.4 4.3 4.6  Chloride 98 - 111 mmol/L 102 109 105  CO2 22 - 32 mmol/L 29 33(H) 32  Calcium 8.9 - 10.3 mg/dL 9.1 9.0 9.2  Total Protein 6.5 - 8.1 g/dL 6.0(L) 5.8(L) 6.3  Total Bilirubin 0.3 - 1.2 mg/dL 0.5 0.4 0.5  Alkaline Phos 38 - 126 U/L 103 - -  AST 15 - 41 U/L 12(L) 14 19  ALT 0 - 44 U/L 6 7(L) 8(L)    Renal function Estimated Creatinine Clearance: 29.2 mL/min (A) (by C-G formula based on SCr of 2.03 mg/dL (H)).  Hgb A1c MFr Bld (% of total Hgb)  Date Value  03/05/2020 5.5    LDL Cholesterol (Calc)  Date Value Ref Range Status  03/05/2020 77 mg/dL (calc) Final    Comment:    Reference range: <100 . Desirable range <100 mg/dL for primary prevention;   <70 mg/dL for patients with CHD or diabetic patients  with > or = 2 CHD risk factors. Marland Kitchen LDL-C is now calculated using the Martin-Hopkins  calculation, which is a validated novel method providing  better accuracy than the Friedewald equation in the  estimation of LDL-C.  Cresenciano Genre et al. Annamaria Helling. 5320;233(43): 2061-2068  (http://education.[facility].com/faq/FAQ164)      Vascular Imaging: CTA personally reviewed Small AAA (45mm) Bilateral CIAA (~59mm) Bilateral IIAA (~52mm) No evidence of impending rupture on my review  Yevonne Aline. Stanford Breed, MD [facility] Phone Number: [phone] 10/05/2020 10:11 PM

## 2020-10-05 NOTE — H&P (Signed)
History and Physical   TARRIS DELBENE YTK:[id number] DOB: [birth year] DOA: 10/05/2020  Referring MD/NP/PA: Dr. Sherry Ruffing  PCP: [name], MD   Outpatient Specialists: Dr. Leonie Man, neurology  Patient coming from: Home  Chief Complaint: Fall with back pain  HPI: [name] is a 87 y.o. male with medical history significant of coronary artery disease status post quadruple bypass in March 2017, bilateral iliac artery aneurysms, left common iliac artery aneurysm, vascular dementia diabetes, duodenal ulcer, GERD, hyperlipidemia, carotid artery disease, essential hypertension, history of TIA, history of IBS, who presented to [facility] ER with low back pain.  Patient reported having a fall on Saturday afternoon while he was on his scooter.  He hit a pothole and fell over onto the grass.  Since then he is felt right-sided posterior rib and back pain.  He denied any significant chest pain.  No fever or chills.  No other injuries.  Patient came to the ER where he was seen and evaluated.  His blood pressure was noted to be markedly elevated about 200s over 100s.  Additionally CT scan showed his infrarenal aortic aneurysm now at 4.5 cm.  There is worried that this may be contributing to the pain he is having.  Vascular surgery was consulted.  Patient subsequently for inpatient care with plan for vascular surgery evaluation and possible repair.  Patient has received fentanyl and IV labetalol initially.  Oxygen sat dropped briefly to 89% on room air.  He is now more stable on room air.  No chest pain.  The back pain is more with mobility..  ED Course: Temperature 98 blood pressure 213/116, pulse 81 respirate of 18 oxygen sats 92% on room air.  Chemistry largely within normal except creatinine 2.08 BUN 32 and glucose 107.  Chemistry showed hemoglobin 11.3 the rest appeared to be within normal.  COVID-19 screen and influenza negative.  X-ray of the lumbar ribs and thoracic spinous showed no acute findings.   CT angiogram of the chest and abdomen showed 4.5 cm infrarenal aortic aneurysm, also mild bilateral internal iliac artery aneurysms and, iliac artery aneurysm.  Vascular surgery Dr. Luan Pulling consulted and patient being admitted to medical service.  Review of Systems: As per HPI otherwise 10 point review of systems negative.    Past Medical History:  Diagnosis Date  . AAA (abdominal aortic aneurysm) ([place])   . Aneurysm of iliac artery (HCC)   . CAD (coronary artery disease)    a. s/p CABGx4 in 07/2015.  . Carotid artery disease ([place])   . Colon polyps   . Diabetes ([place])   . Difficult intubation   . 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 Esophageal reflux   . High cholesterol   . History of IBS 02/27/2009  . Hypertension    pt denies, he says he has a h/o hypotension. If BP up he adjusts the [place]  . Jejunitis with partial SBO 05/20/2017  . Orthostatic hypotension   . Prostatitis   . Stroke ([place])   . TIA (transient ischemic attack)   . Upper GI bleed 06/24/2018  . Vitamin B 12 deficiency   . Vitamin D deficiency     Past Surgical History:  Procedure Laterality Date  . BIOPSY  06/25/2018   Procedure: BIOPSY;  Surgeon: Rush Landmark Telford Nab., MD;  Location: [place];  Service: Gastroenterology;;  . BIOPSY  04/24/2019   Procedure: BIOPSY;  Surgeon: Irving Copas., MD;  Location: [facility];  Service: Gastroenterology;;  . [place]  traumatic pelvic fractures, urethral and bladder repair  . CARDIAC CATHETERIZATION N/A 07/01/2015   Procedure: Left Heart Cath and Coronary Angiography;  Surgeon: Wellington Hampshire, MD;  Location: [place] [facility];  Service: Cardiovascular;  Laterality: N/A;  . COLON RESECTION N/A 05/17/2017   Procedure: DIAGNOSTIC LAPAROSCOPY,;  Surgeon: Leighton Ruff, MD;  Location: [facility];  Service: General;  Laterality: N/A;  . COLONOSCOPY WITH PROPOFOL N/A 04/24/2019   Procedure: COLONOSCOPY WITH PROPOFOL;  Surgeon: Irving Copas., MD;  Location: [facility];  Service: Gastroenterology;  Laterality: N/A;  . CORONARY ANGIOPLASTY WITH STENT PLACEMENT    . CORONARY ARTERY BYPASS GRAFT N/A 07/06/2015   Procedure: CORONARY ARTERY BYPASS GRAFTING (CABG)x 4   utilizing the left internal mammary artery and endoscopically harvested bilateral  sapheneous vein.;  Surgeon: Ivin Poot, MD;  Location: [facility];  Service: Open Heart Surgery;  Laterality: N/A;  . ESOPHAGEAL DILATION  04/24/2019   Procedure: ESOPHAGEAL DILATION;  Surgeon: Rush Landmark Telford Nab., MD;  [facility]: [facility];  Service: Gastroenterology;;  . ESOPHAGOGASTRODUODENOSCOPY (EGD) WITH PROPOFOL N/A 06/25/2018   Procedure: ESOPHAGOGASTRODUODENOSCOPY (EGD) WITH PROPOFOL;  Surgeon: Irving Copas., MD;  Location: [place];  Service: Gastroenterology;  Laterality: N/A;  . ESOPHAGOGASTRODUODENOSCOPY (EGD) WITH PROPOFOL N/A 04/24/2019   Procedure: ESOPHAGOGASTRODUODENOSCOPY (EGD) WITH PROPOFOL;  Surgeon: Rush Landmark Telford Nab., MD;  [facility];  Service: Gastroenterology;  Laterality: N/A;  . FEMUR IM NAIL Right 11/16/2019   Procedure: RIGHT HIP INTRAMEDULLARY (IM) NAIL FEMORAL;  Surgeon: Meredith Pel, MD;  Location: [place];  Service: Orthopedics;  Laterality: Right;  . HEMOSTASIS CLIP PLACEMENT  04/24/2019   Procedure: HEMOSTASIS CLIP PLACEMENT;  Surgeon: Irving Copas., MD;  [facility]: [facility];  Service: Gastroenterology;;  . KNEE SURGERY    . LOOP RECORDER INSERTION N/A 02/19/2018   Procedure: LOOP RECORDER INSERTION;  Surgeon: Deboraha Sprang, MD;  Location: [place] [facility];  Service: Cardiovascular;  Laterality: N/A;  . POLYPECTOMY  04/24/2019   Procedure: POLYPECTOMY;  Surgeon: Rush Landmark Telford Nab., MD;  Location: [facility];  Service: Gastroenterology;;  . Lia Foyer LIFTING INJECTION  04/24/2019   Procedure: SUBMUCOSAL LIFTING INJECTION;  Surgeon: Irving Copas., MD;  [facility];   Service: Gastroenterology;;  . TEE WITHOUT CARDIOVERSION N/A 07/06/2015   Procedure: TRANSESOPHAGEAL ECHOCARDIOGRAM (TEE);  Surgeon: Ivin Poot, MD;  Location: [place];  Service: Open Heart Surgery;  Laterality: N/A;     reports that he quit smoking about 57 years ago. He quit smokeless tobacco use about 33 years ago.  His smokeless tobacco use included chew. He reports that he does not drink alcohol and does not use drugs.  Allergies  Allergen Reactions  . Other Other (See Comments)    Pt reports allergic to a medication but unsure of name or type of med  . Cymbalta [Duloxetine Hcl] Other (See Comments)    Dizziness, hallucinations.   . Keflex [Cephalexin] Other (See Comments)    dizziness  . Simvastatin Other (See Comments)    Joint pain  . Sudafed [Pseudoephedrine] Other (See Comments)    Dizziness    Family History  Problem Relation Age of Onset  . Heart attack Father        died age 30  . Heart attack Brother        died age 61  . Anuerysm Brother        aortic  . Heart attack Sister        died age  41  . Colon cancer Sister   . Liver cancer Sister   . Diabetes Maternal Grandmother   . Arthritis Mother   . Dementia Mother   . Heart Problems Brother   . Heart Problems Brother   . Heart Problems Brother   . Heart Problems Brother   . Healthy Daughter   . Healthy Son   . Esophageal cancer Neg Hx   . Inflammatory bowel disease Neg Hx   . Pancreatic cancer Neg Hx   . Stomach cancer Neg Hx      Prior to Admission medications   Medication Sig Start Date End Date Taking? Authorizing Provider  aspirin EC 81 MG tablet Take 2 tablets (162 mg total) by mouth daily. 10/03/19  Yes Garvin Fila, MD  Calcium Carb-Cholecalciferol (CALCIUM+D3 PO) Take 1 tablet by mouth daily.   Yes [provider]  Cholecalciferol (VITAMIN D-3) 5000 units TABS Take 5,000 Units by mouth daily.   Yes [provider]  ferrous sulfate 325 (65 FE) MG tablet Take 325 mg by mouth  daily with breakfast.   Yes [provider]  fludrocortisone (FLORINEF) 0.1 MG tablet Take 2 tablets Daily for Low BP Patient taking differently: Take 0.1 mg by mouth daily. Daily for Low BP 08/20/19  Yes [name], MD  levothyroxine (SYNTHROID) 25 MCG tablet TAKE 1 TABLET BY MOUTH EVERY DAY ON EMPTY STOMACH WITH ONLY WATER FOR 30 MINS 06/23/20  Yes Liane Comber, NP  meloxicam (MOBIC) 15 MG tablet Take  1/2 to 1 tablet  Daily  with Food  for Pain & Inflammation 08/11/20  Yes [name], MD  memantine (NAMENDA) 10 MG tablet TAKE 1 TABLET BY MOUTH TWICE A DAY 06/22/20  Yes Garvin Fila, MD  olmesartan (BENICAR) 40 MG tablet Take 40 mg by mouth daily as needed.   Yes [provider]  pantoprazole (PROTONIX) 40 MG tablet Take      1 tablet      Daily       to Prevent Indigestion & Heartburn 04/19/20  Yes [name], MD  pravastatin (PRAVACHOL) 40 MG tablet Take     1 tablet     at Bedtime         for Cholesterol 04/12/20  Yes [name], MD  tamsulosin (FLOMAX) 0.4 MG CAPS capsule TAKE 1 TABLET 1 HOUR BEFORE BEDTIME FOR BLADDER EMPTYING 05/15/20  Yes Liane Comber, NP  vitamin B-12 (CYANOCOBALAMIN) 250 MCG tablet Take 250 mcg by mouth daily. 1 Tablet Daily   Yes [provider]  Zinc 50 MG TABS Take 1 tablet by mouth daily.   Yes [provider]  acetaminophen (TYLENOL) 500 MG tablet Take 1,000 mg by mouth in the morning and at bedtime.    [provider]  amLODipine (NORVASC) 5 MG tablet TAKE 1 TABLET BY MOUTH DAILY FOR BP 07/08/20   Liane Comber, NP  buPROPion (WELLBUTRIN XL) 150 MG 24 hr tablet TAKE 1 TABLET BY MOUTH EVERY DAY IN THE MORNING 07/13/20   Liane Comber, NP  butalbital-acetaminophen-caffeine (FIORICET) [phone] MG tablet Take     1 tablet      every 4 hours       ONLY       if needed for Severe Headache 03/28/20   [name], MD  glucose blood test strip Check blood sugar 1 time daily-DX-E11.9 Patient  taking differently: 1 each by Other route as needed. Check blood sugar 1 time daily-DX-E11.9 07/07/17   Melford Aase,  Gwyndolyn Saxon, MD  Multiple Vitamin (MULTIVITAMIN WITH MINERALS) TABS tablet Take 1 tablet by mouth daily.    [provider]  polyethylene glycol (MIRALAX / GLYCOLAX) packet Take 17 g by mouth daily. Patient taking differently: Take 17 g by mouth 2 (two) times daily. 05/22/17   Barton Dubois, MD  potassium chloride (KLOR-CON) 10 MEQ tablet Take 10 mEq by mouth daily. 1 Tablet Daily    [provider]    Physical Exam: Vitals:   10/05/20 1815 10/05/20 1930 10/05/20 1945 10/05/20 2158  BP: (!) 180/88 130/70 125/63   Pulse:      Resp:      Temp:    97.8 F (36.6 C)  TempSrc:    Oral  SpO2:      Weight:      Height:          Constitutional: Pleasant, stable, no distress Vitals:   10/05/20 1815 10/05/20 1930 10/05/20 1945 10/05/20 2158  BP: (!) 180/88 130/70 125/63   Pulse:      Resp:      Temp:    97.8 F (36.6 C)  TempSrc:    Oral  SpO2:      Weight:      Height:       Eyes: PERRL, lids and conjunctivae normal ENMT: Mucous membranes are moist. Posterior pharynx clear of any exudate or lesions.Normal dentition.  Neck: normal, supple, no masses, no thyromegaly Respiratory: clear to auscultation bilaterally, no wheezing, no crackles. Normal respiratory effort. No accessory muscle use.  Cardiovascular: Regular rate and rhythm, no murmurs / rubs / gallops. No extremity edema. 2+ pedal pulses. No carotid bruits.  Abdomen: no tenderness, no masses palpated. No hepatosplenomegaly. Bowel sounds positive.  Musculoskeletal: no clubbing / cyanosis. No joint deformity upper and lower extremities. Good ROM, no contractures. Normal muscle tone.  Skin: Multiple surgical scars including coronary artery bypass grafts, no rashes, lesions, ulcers. No induration Neurologic: CN 2-12 grossly intact. Sensation intact, DTR normal. Strength 5/5 in all 4.  Psychiatric: Normal  judgment and insight. Alert and oriented x 3. Normal mood.     Labs on Admission: I have personally reviewed following labs and imaging studies  CBC: Recent Labs  Lab 10/05/20 1248  WBC 6.6  NEUTROABS 4.0  HGB 11.3*  HCT 35.4*  MCV 100.0  PLT 673*   Basic Metabolic Panel: Recent Labs  Lab 10/05/20 1248  NA 139  K 4.4  CL 102  CO2 29  GLUCOSE 107*  BUN 32*  CREATININE 2.03*  CALCIUM 9.1   GFR: Estimated Creatinine Clearance: 29.2 mL/min (A) (by C-G formula based on SCr of 2.03 mg/dL (H)). Liver Function Tests: Recent Labs  Lab 10/05/20 1248  AST 12*  ALT 6  ALKPHOS 103  BILITOT 0.5  PROT 6.0*  ALBUMIN 3.7   No results for input(s): LIPASE, AMYLASE in the last 168 hours. No results for input(s): AMMONIA in the last 168 hours. Coagulation Profile: Recent Labs  Lab 10/05/20 1248  INR 1.0   Cardiac Enzymes: No results for input(s): CKTOTAL, CKMB, CKMBINDEX, TROPONINI in the last 168 hours. BNP (last 3 results) No results for input(s): PROBNP in the last 8760 hours. HbA1C: No results for input(s): HGBA1C in the last 72 hours. CBG: No results for input(s): GLUCAP in the last 168 hours. Lipid Profile: No results for input(s): CHOL, HDL, LDLCALC, TRIG, CHOLHDL, LDLDIRECT in the last 72 hours. Thyroid Function Tests: No results for input(s): TSH, T4TOTAL, FREET4, T3FREE, THYROIDAB in the  last 72 hours. Anemia Panel: No results for input(s): VITAMINB12, FOLATE, FERRITIN, TIBC, IRON, RETICCTPCT in the last 72 hours. Urine analysis:    Component Value Date/Time   COLORURINE YELLOW 01/30/2020 [place] 01/30/2020 1434   LABSPEC 1.011 01/30/2020 1434   PHURINE 6.5 01/30/2020 1434   GLUCOSEU NEGATIVE 01/30/2020 1434   HGBUR NEGATIVE 01/30/2020 1434   BILIRUBINUR NEGATIVE 11/21/2019 0822   KETONESUR NEGATIVE 01/30/2020 1434   PROTEINUR NEGATIVE 01/30/2020 1434   UROBILINOGEN 1.0 01/05/2015 1550   NITRITE NEGATIVE 01/22/2020 1605    LEUKOCYTESUR TRACE (A) 01/22/2020 1605   Sepsis Labs: @LABRCNTIP (procalcitonin:4,lacticidven:4) ) Recent Results (from the past 240 hour(s))  Resp Panel by RT-PCR (Flu A&B, Covid) Nasopharyngeal Swab     Status: None   Collection Time: 10/05/20 12:48 PM   Specimen: Nasopharyngeal Swab; Nasopharyngeal(NP) swabs in vial transport medium  Result Value Ref Range Status   SARS Coronavirus 2 by RT PCR NEGATIVE NEGATIVE Final    Comment: (NOTE) SARS-CoV-2 target nucleic acids are NOT DETECTED.  The SARS-CoV-2 RNA is generally detectable in upper respiratory specimens during the acute phase of infection. The lowest concentration of SARS-CoV-2 viral copies this assay can detect is 138 copies/mL. A negative result does not preclude SARS-Cov-2 infection and should not be used as the sole basis for treatment or other patient management decisions. A negative result may occur with  improper specimen collection/handling, submission of specimen other than nasopharyngeal swab, presence of viral mutation(s) within the areas targeted by this assay, and inadequate number of viral copies(<138 copies/mL). A negative result must be combined with clinical observations, patient history, and epidemiological information. The expected result is Negative.  Fact Sheet for Patients:  EntrepreneurPulse.com.au  Fact Sheet for Healthcare Providers:  IncredibleEmployment.be  This test is no t yet approved or cleared by the [place] FDA and  has been authorized for detection and/or diagnosis of SARS-CoV-2 by FDA under an Emergency Use Authorization (EUA). This EUA will remain  in effect (meaning this test can be used) for the duration of the COVID-19 declaration under Section 564(b)(1) of the Act, 21 U.S.C.section 360bbb-3(b)(1), unless the authorization is terminated  or revoked sooner.       Influenza A by PCR NEGATIVE NEGATIVE Final   Influenza B by PCR NEGATIVE  NEGATIVE Final    Comment: (NOTE) The Xpert Xpress SARS-CoV-2/FLU/RSV plus assay is intended as an aid in the diagnosis of influenza from Nasopharyngeal swab specimens and should not be used as a sole basis for treatment. Nasal washings and aspirates are unacceptable for Xpert Xpress SARS-CoV-2/FLU/RSV testing.  Fact Sheet for Patients: EntrepreneurPulse.com.au  Fact Sheet for Healthcare Providers: IncredibleEmployment.be  This test is not yet approved or cleared by the [place] FDA and has been authorized for detection and/or diagnosis of SARS-CoV-2 by FDA under an Emergency Use Authorization (EUA). This EUA will remain in effect (meaning this test can be used) for the duration of the COVID-19 declaration under Section 564(b)(1) of the Act, 21 U.S.C. section 360bbb-3(b)(1), unless the authorization is terminated or revoked.  Performed at [facility], [address] [place], [place] [address]      Radiological Exams on Admission: DG Ribs Unilateral W/Chest Right  Result Date: 10/05/2020 CLINICAL DATA:  [place] from scooter yesterday, RIGHT back pain EXAM: RIGHT RIBS AND CHEST - 3+ VIEW COMPARISON:  11/21/2019 FINDINGS: Loop recorder projects over cardiac silhouette. Upper normal size of cardiac silhouette post CABG. Tortuous aorta. Mediastinal contours and pulmonary vascularity otherwise  normal. Peribronchial thickening with scattered interstitial prominence at the lower lungs unchanged. No acute infiltrate, pleural effusion, or pneumothorax. Bowel interposition between liver and diaphragm. Bones demineralized. No definite rib fracture or bone destruction. Scattered degenerative changes of thoracolumbar spine. IMPRESSION: No acute abnormalities. Electronically Signed   By: Lavonia Dana M.D.   On: 10/05/2020 12:41   DG Thoracic Spine 2 View  Result Date: 10/05/2020 CLINICAL DATA:  RIGHT-side back pain, fell from scooter  yesterday EXAM: THORACIC SPINE 2 VIEWS COMPARISON:  Chest radiograph 11/29/2018 FINDINGS: Loop recorder projects over LEFT chest. Post CABG. Vertebral body heights maintained without fracture or subluxation. Multilevel endplate spur formation. Visualized ribs grossly intact. IMPRESSION: No acute abnormalities. Osseous demineralization with degenerative disc disease changes thoracic spine. Electronically Signed   By: Lavonia Dana M.D.   On: 10/05/2020 12:42   DG Lumbar Spine Complete  Result Date: 10/05/2020 CLINICAL DATA:  [place] from scooter yesterday, RIGHT-side back pain EXAM: LUMBAR SPINE - COMPLETE 4+ VIEW COMPARISON:  05/09/2019 FINDINGS: 5 non-rib-bearing lumbar vertebra. Chronic dextroconvex scoliosis. Multilevel disc space narrowing and endplate spur formation. Multilevel facet degenerative changes. Retrolisthesis L4-L5 unchanged. No fracture, additional subluxation, or bone destruction. No spondylolysis. Suspected aneurysmal dilatation of the abdominal aorta 5.9 cm AP radiographically, though this contains inherent radiographic magnification. Also suspected aneurysmal dilatation of a common iliac artery. IMPRESSION: Osseous demineralization with chronic degenerative disc/facet disease changes of lumbar spine with dextroconvex scoliosis. No acute osseous abnormalities. Suspected abdominal aortic aneurysm 5.9 cm AP radiographically; ultrasound or CT evaluation recommended. Findings called to Dr. Sherry Ruffing on 10/05/2020 at 1246 hours. Electronically Signed   By: Lavonia Dana M.D.   On: 10/05/2020 12:46   CT Angio Chest/Abd/Pel for Dissection W and/or Wo Contrast  Result Date: 10/05/2020 CLINICAL DATA:  Chest and back pain. Fell from scooter a few days ago. Known AAA. EXAM: CT ANGIOGRAPHY CHEST, ABDOMEN AND PELVIS TECHNIQUE: Multidetector CT imaging through the chest, abdomen and pelvis was performed using the standard protocol during bolus administration of intravenous contrast. Multiplanar reconstructed  images and MIPs were obtained and reviewed to evaluate the vascular anatomy. CONTRAST:  2mL OMNIPAQUE IOHEXOL 350 MG/ML SOLN COMPARISON:  CT scan 05/17/2017 FINDINGS: CTA CHEST FINDINGS Cardiovascular: The heart is mildly enlarged. No pericardial effusion. There is moderate tortuosity and atherosclerotic disease involving the thoracic aorta but no focal aneurysm or dissection. Surgical changes from coronary artery bypass surgery with three-vessel coronary artery calcifications. The aortic branch vessels are patent. The pulmonary arterial tree is well opacified. No findings for pulmonary embolism. Mediastinum/Nodes: No mediastinal or hilar mass or lymphadenopathy. The esophagus is grossly normal. Lungs/Pleura: Moderate emphysematous changes and pulmonary scarring. No infiltrates, edema or effusions. No pneumothorax. No worrisome pulmonary lesions. 6 mm perifissural nodule along the minor fissure contains a small calcification and is likely benign. Musculoskeletal: No chest wall mass or hematoma. No findings suspicious for sternal or rib fracture. The thoracic vertebral bodies are normally aligned. No acute fracture. Review of the MIP images confirms the above findings. CTA ABDOMEN AND PELVIS FINDINGS VASCULAR Aorta: Tortuosity and ectasia of the abdominal aorta with moderate scattered atherosclerotic calcifications. There is a infrarenal abdominal aortic aneurysm with maximum measurements of 4.5 x 4.1 cm. No dissection. No involvement of the bifurcation. Celiac: Patent.  Mild ostial calcifications. SMA: Patent.  Mild calcifications at the ostia. Renals: Patent. Mild ostial calcifications bilaterally. Mild proximal stenosis of both vessels. IMA: Patent Inflow: Fusiform aneurysmal dilatation both common iliac arteries with maximum diameter of 3 cm on the  left and 28.5 mm on the right. Bilateral internal iliac artery aneurysms. The right measures 2.0 cm and the left 2.2 cm. Veins: Grossly normal. Review of the MIP  images confirms the above findings. NON-VASCULAR Hepatobiliary: No hepatic lesions or acute hepatic injury. The gallbladder demonstrates small layering gallstones. No common bile duct dilatation. Pancreas: Moderate pancreatic atrophy but no mass or inflammation. Spleen: Normal size.  No acute injury. Adrenals/Urinary Tract: The adrenal glands and kidneys are unremarkable. No renal lesions or hydronephrosis. No perinephric hematoma. The bladder is unremarkable. Stomach/Bowel: The stomach, duodenum, small bowel and colon are grossly normal. Lymphatic: No abdominal or pelvic lymphadenopathy. Reproductive: Mild prostate gland enlargement. The seminal vesicles are unremarkable. Other: No pelvic mass or adenopathy. No free pelvic fluid collections. No inguinal mass or adenopathy. No abdominal wall hernia or subcutaneous lesions. Musculoskeletal: Lumbar scoliosis and degenerative lumbar spondylosis but no acute bony findings. Right hip hardware noted. No complicating features. No pelvic fractures. Review of the MIP images confirms the above findings. IMPRESSION: 1. Moderate tortuosity and atherosclerotic disease involving the thoracic aorta but no focal aneurysm or dissection. 2. Surgical changes from coronary artery bypass surgery with three-vessel coronary artery calcifications. 3. Moderate emphysematous changes and pulmonary scarring. No acute pulmonary findings or worrisome pulmonary lesions. 4. 4.5 x 4.1 cm infrarenal abdominal aortic aneurysm Recommend follow-up CT/MR every 6 months and vascular consultation. This recommendation follows ACR consensus guidelines: White Paper of the ACR Incidental Findings Committee II on Vascular Findings. J Am Coll Radiol 2013; 10:789-794. 5. Bilateral common iliac artery aneurysms and bilateral internal iliac artery aneurysms. 6. No acute abdominal/pelvic findings, mass lesions or adenopathy. Aortic Atherosclerosis (ICD10-I70.0) and Emphysema (ICD10-J43.9). Electronically Signed    By: Marijo Sanes M.D.   On: 10/05/2020 14:33    EKG: Independently reviewed.  EKG shows sinus rhythm with prolonged PR interval.  There is incomplete right bundle branch block.  Otherwise appears to be at baseline.  Assessment/Plan Principal Problem:   Hypertensive urgency Active Problems:   Coronary atherosclerosis- s/p PCI to LAD in 2009 and PCI to RCA in 2011   Abdominal aortic aneurysm (HCC)   GERD   Diabetes mellitus type 2, diet-controlled ([place])   CKD stage 3 due to type 2 diabetes mellitus ([place])   DDD (degenerative disc disease), lumbar   Low back pain   Hyperlipidemia, mixed   At high risk for falls     #1 hypertensive urgency: This has improved with IV beta-blockers.  Currently on IV labetalol.  We will continue with IV Lopressor 5 mg every 6 hours as needed.  Also will confirm and continue home regimen.  It appears he was on amlodipine and Benicar at home.  Med rec not yet confirmed.  #2 infrarenal aortic abdominal aneurysm: We will defer to Dr. Roselie Awkward.  Recommendations is to get an MRI of the low back and if this is not the cause of his pain patient likely to get repaired this admission.  #3 low back pain: Probably musculoskeletal.  We will be checking MRI of the lumbar spine and thoracic spine.  #4 hyperlipidemia: On pravastatin at home.  Most likely will continue once confirmed dose.  #5 GERD: Continue with PPIs  #6 diet-controlled diabetes: Sliding scale insulin  #7 chronic kidney disease stage III: Appears to be at baseline.  Continue to monitor  #7 degenerative disc disease: We will get the MRI and monitor.  #8 vascular dementia: Early in the disease process.  Initiated on [place] recently.  #9 hypothyroidism:  Continue levothyroxine 25 mg daily   DVT prophylaxis: Lovenox Code Status: DNR Family Communication: No family at bedside Disposition Plan: Home Consults called: Dr. Stanford Breed, vascular surgery Admission status: Inpatient  Severity of  Illness: The appropriate patient status for this patient is INPATIENT. Inpatient status is judged to be reasonable and necessary in order to provide the required intensity of service to ensure the patient's safety. The patient's presenting symptoms, physical exam findings, and initial radiographic and laboratory data in the context of their chronic comorbidities is felt to place them at high risk for further clinical deterioration. Furthermore, it is not anticipated that the patient will be medically stable for discharge from the hospital within 2 midnights of admission. The following factors support the patient status of inpatient.   " The patient's presenting symptoms include fall with low back pain. " The worrisome physical exam findings include elevated blood pressure. " The initial radiographic and laboratory data are worrisome because of no significant findings that is new. " The chronic co-morbidities include abdominal aortic aneurysm.   * I certify that at the point of admission it is my clinical judgment that the patient will require inpatient hospital care spanning beyond 2 midnights from the point of admission due to high intensity of service, high risk for further deterioration and high frequency of surveillance required.Barbette Merino MD [facility] Pager [phone]  If 7PM-7AM, please contact night-coverage [email or web address] Password [facility]  10/05/2020, 10:22 PM

## 2020-10-05 NOTE — Telephone Encounter (Signed)
Returned call to pt's wife, spoke to [place].  She stated he has his EEG completed here at [facility] on 09/15/20 and has not gotten the results.  Message sent to Barrie Lyme for followup.

## 2020-10-05 NOTE — ED Notes (Signed)
Pt transported to radiology for CT.  Wife given coffee at bedside.

## 2020-10-05 NOTE — ED Notes (Signed)
Per wife at bedside, pt normally has low BP and takes medication at home to 'keep his BP up."

## 2020-10-05 NOTE — ED Notes (Addendum)
Approx 2 min post fentanyl administration Pt started to relax in the bed, stating he felt a little light-headed.  Pt drifted to sleep and once sleeping, O2 sats dropped to 88% and respirations 9-10.  RN gently aroused pt and instructed to take a few deep breathes. Pt able to follow commands and o2 returned to 98%.  Pt closed eyes again and resumed sleep, O2 once again dropped to 88-90%.  RN applied 2L O2 [place] and pt is now resting quietly in bed. O2 100%, respirations even, unlabored and 12-13 breathes/min.  MD notified.  Wife at bedside.

## 2020-10-05 NOTE — ED Triage Notes (Signed)
Reports fall on Saturday afternoon while on scooter, hit a pothole and fell over onto grass. Right sided posterior rib/back pain.

## 2020-10-05 NOTE — ED Notes (Signed)
Report called to Keri, RN

## 2020-10-06 ENCOUNTER — Ambulatory Visit (HOSPITAL_COMMUNITY)
Admission: RE | Admit: 2020-10-06 | Payer: Medicare Other | Source: Ambulatory Visit | Attending: Cardiology | Admitting: Cardiology

## 2020-10-06 ENCOUNTER — Observation Stay (HOSPITAL_COMMUNITY): Payer: Medicare Other

## 2020-10-06 ENCOUNTER — Telehealth: Payer: Self-pay | Admitting: Neurology

## 2020-10-06 ENCOUNTER — Ambulatory Visit: Payer: Medicare Other | Admitting: Physical Medicine and Rehabilitation

## 2020-10-06 ENCOUNTER — Telehealth: Payer: Self-pay | Admitting: Physical Medicine and Rehabilitation

## 2020-10-06 DIAGNOSIS — I160 Hypertensive urgency: Secondary | ICD-10-CM | POA: Diagnosis not present

## 2020-10-06 LAB — GLUCOSE, CAPILLARY
Glucose-Capillary: 99 mg/dL (ref 70–99)
Glucose-Capillary: 114 mg/dL — ABNORMAL HIGH (ref 70–99)
Glucose-Capillary: 133 mg/dL — ABNORMAL HIGH (ref 70–99)
Glucose-Capillary: 84 mg/dL (ref 70–99)

## 2020-10-06 LAB — COMPREHENSIVE METABOLIC PANEL
ALT: 8 U/L (ref 0–44)
AST: 15 U/L (ref 15–41)
Albumin: 3.3 g/dL — ABNORMAL LOW (ref 3.5–5.0)
Alkaline Phosphatase: 102 U/L (ref 38–126)
Anion gap: 9 (ref 5–15)
BUN: 30 mg/dL — ABNORMAL HIGH (ref 8–23)
CO2: 25 mmol/L (ref 22–32)
Calcium: 8.5 mg/dL — ABNORMAL LOW (ref 8.9–10.3)
Chloride: 102 mmol/L (ref 98–111)
Creatinine, Ser: 1.99 mg/dL — ABNORMAL HIGH (ref 0.61–1.24)
GFR, Estimated: 32 mL/min — ABNORMAL LOW (ref >60)
Glucose, Bld: 115 mg/dL — ABNORMAL HIGH (ref 70–99)
Potassium: 3.8 mmol/L (ref 3.5–5.1)
Sodium: 136 mmol/L (ref 135–145)
Total Bilirubin: 0.7 mg/dL (ref 0.3–1.2)
Total Protein: 5.7 g/dL — ABNORMAL LOW (ref 6.5–8.1)

## 2020-10-06 MED ORDER — HYDRALAZINE HCL 20 MG/ML IJ SOLN: 10.0000 mg | Freq: Four times a day (QID) | INTRAMUSCULAR | Status: DC | PRN

## 2020-10-06 MED ORDER — GADOBUTROL 1 MMOL/ML IV SOLN
8.0000 mL | Freq: Once | INTRAVENOUS | Status: AC | PRN
  Administered 2020-10-06: 8 mL via INTRAVENOUS

## 2020-10-06 MED ORDER — HYDRALAZINE HCL 25 MG PO TABS
25.0000 mg | ORAL_TABLET | Freq: Three times a day (TID) | ORAL | Status: DC
  Administered 2020-10-06 – 2020-10-07 (×3): 25 mg via ORAL
  Filled 2020-10-06 (×3): qty 1

## 2020-10-06 NOTE — Progress Notes (Addendum)
[facility]  Subjective  - No change in lumbar pain   Objective (!) 170/73 90 97.8 F (36.6 C) (Oral) 19 97%  Intake/Output Summary (Last 24 hours) at 10/06/2020 0919 Last data filed at 10/05/2020 1623 Gross per 24 hour  Intake 503.61 ml  Output --  Net 503.61 ml    Moving all 4 ext, feet warm well perfused Abdomin soft NTTP Lungs non labored breathing No change in exam   Assessment/Planning:  Small AAA (37mm) Bilateral CIAA (~63mm) Bilateral IIAA (~73mm) No evidence of impending rupture  Pending MRI of the lumbar spine may help [place] sort this out. If his pain does not improve or if MRI of lumbar spine is not suggestive of musculoskeletal cause of symptoms, we will plan to repair this aneurysm this admission per Dr. Roselie Awkward plan.  [name] 10/06/2020 9:19 AM --  Laboratory Lab Results: Recent Labs    10/05/20 1248 10/05/20 2316  WBC 6.6 8.4  HGB 11.3* 11.4*  HCT 35.4* 36.2*  PLT 128* 143*   BMET Recent Labs    10/05/20 1248 10/05/20 2316  NA 139 136  K 4.4 3.8  CL 102 102  CO2 29 25  GLUCOSE 107* 115*  BUN 32* 30*  CREATININE 2.03* 1.99*  CALCIUM 9.1 8.5*    COAG Lab Results  Component Value Date   INR 1.0 10/05/2020   INR 1.1 11/15/2019   INR 1.1 09/25/2019   No results found for: PTT   VASCULAR STAFF ADDENDUM: I have independently interviewed and examined the patient. I agree with the above.  Strongly suspect musculoskeletal cause of pain. Will follow up on MRI and re-evaluate this evening.  Yevonne Aline. Stanford Breed, MD [facility] Phone Number: [phone] 10/06/2020 12:29 PM

## 2020-10-06 NOTE — Telephone Encounter (Signed)
Called patient's wife. Appointment cancelled and patient will call to reschedule.

## 2020-10-06 NOTE — Procedures (Signed)
    History:  [name] is an 87 year old gentleman with a history of episodes of altered mental status and confusion, he was seen in the emergency room on 25 Sep 2019 for this reason.  He was noted to have some left upper extremity drift as well.  He has some underlying cognitive impairment.  He is being evaluated for these issues.  This is a routine EEG.  No skull defects are noted.  Medications include aspirin, Wellbutrin, Fioricet, vitamin D, Florinef, Synthroid, Mobic, Namenda, multivitamins, Benicar, Protonix, potassium, Pravachol, Flomax, Norvasc, iron supplementation, Namenda, and vitamin B12.  EEG classification: Normal awake  Description of the recording: The background rhythms of this recording consists of a fairly well modulated medium amplitude alpha rhythm of 10 Hz that is reactive to eye opening and closure. As the record progresses, the patient appears to remain in the waking state throughout the recording. Photic stimulation was performed, resulting in a bilateral and symmetric photic driving response. Hyperventilation was also performed, resulting in a minimal buildup of the background rhythm activities without significant slowing seen. At no time during the recording does there appear to be evidence of spike or spike wave discharges or evidence of focal slowing. EKG monitor shows no evidence of cardiac rhythm abnormalities with a heart rate of 72.  Impression: This is a normal EEG recording in the waking state. No evidence of ictal or interictal discharges are seen.

## 2020-10-06 NOTE — Telephone Encounter (Signed)
Pt wife called and he was admitted to the hospital so he will not be able to come in to his appt. She would like for someone to give her a call.  CB [phone]

## 2020-10-06 NOTE — Progress Notes (Signed)
On-call physician notified of elevated BP not  Lowered by prn metoprolol order. Will continue to monitor.

## 2020-10-06 NOTE — Telephone Encounter (Signed)
I called the patient.  The EEG study that was done on 21 Sep 2020 was completely normal.

## 2020-10-06 NOTE — Progress Notes (Signed)
PROGRESS NOTE    DHEERAJ HAIL  QQP:[id number] DOB: 04-11-1934 DOA: 10/05/2020 PCP: [name], MD   Brief Narrative:  DAMARI SUASTEGUI is a 87 y.o. male with medical history significant of coronary artery disease status post quadruple bypass in March 2017, bilateral iliac artery aneurysms, left common iliac artery aneurysm, vascular dementia diabetes, duodenal ulcer, GERD, hyperlipidemia, carotid artery disease, essential hypertension, history of TIA, history of IBS, who presented to [facility] ER with low back pain and profound hypertension.    Patient fell over the weekend off his scooter into grass, no other injuries.    Presented to ED for worsening back pain that was uncontrolled at home, CT scan showed his infrarenal aortic aneurysm now at 4.5 cm.  Given unclear nature of back pain whether this is secondary to worsening aneurysm or more mechanical pain in nature he was consulted to vascular surgery evaluation and hospitalist was asked for admission.  Assessment & Plan:   Principal Problem:   Hypertensive urgency Active Problems:   Coronary atherosclerosis- s/p PCI to LAD in 2009 and PCI to RCA in 2011   Abdominal aortic aneurysm (HCC)   GERD   Diabetes mellitus type 2, diet-controlled ([place])   CKD stage 3 due to type 2 diabetes mellitus ([place])   DDD (degenerative disc disease), lumbar   Low back pain   Hyperlipidemia, mixed   At high risk for falls   Acute low back pain, unclear etiology  -Patient had traumatic fall on a scooter concerning for possible lumbar/vertebral injury -MRI to evaluate, if unremarkable we will move forward with further discussion with vascular surgery, if MRI shows acute injury will discuss with orthopedics and spine -CTA shows 4.5 infrarenal aortic aneurysm which also could be the cause of patient's back pain -See below for specific details  Hypertensive urgency with known infrarenal aortic abdominal aneurysm:  -Blood pressure improving somewhat  over the last 12 hours, continue as needed metoprolol hydralazine to maintain systolic blood pressure less than 140 per recommendations  -Add p.o. hydralazine to home amlodipine and Benicar -As above if MRI spine is negative would be concerned patient's back pain is related to worsening aneurysm, lengthy discussion with wife at bedside about the risks and benefits of both surgical treatment as well as delaying or foregoing intervention  Hyperlipidemia:  On pravastatin at home.  Most likely will continue once confirmed dose.  GERD:  Continue with PPIs  Diet-controlled diabetes:  Sliding scale insulin  AKI on CKD 2  Creatinine 1.2-1.4 earlier this year GFR 50-60 Creatinine currently 1.99 - continue p.o. intake, follow morning labs  Degenerative disc disease: We will get the MRI and monitor.  Vascular dementia: Early in the disease process.  Initiated on [place] recently.  Hypothyroidism: Continue levothyroxine 25 mg daily   DVT prophylaxis: Lovenox Code Status: DNR Family Communication: At bedside  Status is: Inpatient  Dispo: The patient is from: Home              Anticipated d/c is to: To be determined              Anticipated d/c date is: 48 to 72 hours not              Patient currently not medically stable for discharge  Consultants:   Vascular surgery  Procedures:   None  Antimicrobials:  None indicated  Subjective: No acute issues or events overnight, back pain ongoing -denies nausea vomiting diarrhea saddle paresthesias incontinence or constipation.  Denies any  lower extremity weakness or paresthesias.  Objective: Vitals:   10/05/20 1930 10/05/20 1945 10/05/20 2158 10/06/20 0340  BP: 130/70 125/63  (!) 170/73  Pulse:    90  Resp:    19  Temp:   97.8 F (36.6 C) 97.8 F (36.6 C)  TempSrc:   Oral Oral  SpO2:    97%  Weight:      Height:        Intake/Output Summary (Last 24 hours) at 10/06/2020 0722 Last data filed at 10/05/2020 1623 Gross per  24 hour  Intake 503.61 ml  Output --  Net 503.61 ml   Filed Weights   10/05/20 1126  Weight: 81.6 kg    Examination: General:  Pleasantly resting in bed, No acute distress. HEENT:  Normocephalic atraumatic.  Sclerae nonicteric, noninjected.  Extraocular movements intact bilaterally. Neck:  Without mass or deformity.  Trachea is midline. Lungs:  Clear to auscultate bilaterally without rhonchi, wheeze, or rales. Heart:  Regular rate and rhythm.  Without murmurs, rubs, or gallops. Abdomen:  Soft, nontender, nondistended.  Without guarding or rebound. Extremities: Without cyanosis, clubbing, edema, or obvious deformity. Vascular:  Dorsalis pedis and posterior tibial pulses palpable bilaterally. Skin:  Warm and dry, no erythema, no ulcerations.  Data Reviewed: I have personally reviewed following labs and imaging studies  CBC: Recent Labs  Lab 10/05/20 1248 10/05/20 2316  WBC 6.6 8.4  NEUTROABS 4.0  --   HGB 11.3* 11.4*  HCT 35.4* 36.2*  MCV 100.0 100.8*  PLT 128* 676*   Basic Metabolic Panel: Recent Labs  Lab 10/05/20 1248 10/05/20 2316  NA 139 136  K 4.4 3.8  CL 102 102  CO2 29 25  GLUCOSE 107* 115*  BUN 32* 30*  CREATININE 2.03* 1.99*  CALCIUM 9.1 8.5*   GFR: Estimated Creatinine Clearance: 29.8 mL/min (A) (by C-G formula based on SCr of 1.99 mg/dL (H)). Liver Function Tests: Recent Labs  Lab 10/05/20 1248 10/05/20 2316  AST 12* 15  ALT 6 8  ALKPHOS 103 102  BILITOT 0.5 0.7  PROT 6.0* 5.7*  ALBUMIN 3.7 3.3*   No results for input(s): LIPASE, AMYLASE in the last 168 hours. No results for input(s): AMMONIA in the last 168 hours. Coagulation Profile: Recent Labs  Lab 10/05/20 1248  INR 1.0   Cardiac Enzymes: No results for input(s): CKTOTAL, CKMB, CKMBINDEX, TROPONINI in the last 168 hours. BNP (last 3 results) No results for input(s): PROBNP in the last 8760 hours. HbA1C: No results for input(s): HGBA1C in the last 72 hours. CBG: Recent Labs   Lab 10/06/20 0612  GLUCAP 99   Lipid Profile: No results for input(s): CHOL, HDL, LDLCALC, TRIG, CHOLHDL, LDLDIRECT in the last 72 hours. Thyroid Function Tests: No results for input(s): TSH, T4TOTAL, FREET4, T3FREE, THYROIDAB in the last 72 hours. Anemia Panel: No results for input(s): VITAMINB12, FOLATE, FERRITIN, TIBC, IRON, RETICCTPCT in the last 72 hours. Sepsis Labs: No results for input(s): PROCALCITON, LATICACIDVEN in the last 168 hours.  Recent Results (from the past 240 hour(s))  Resp Panel by RT-PCR (Flu A&B, Covid) Nasopharyngeal Swab     Status: None   Collection Time: 10/05/20 12:48 PM   Specimen: Nasopharyngeal Swab; Nasopharyngeal(NP) swabs in vial transport medium  Result Value Ref Range Status   SARS Coronavirus 2 by RT PCR NEGATIVE NEGATIVE Final    Comment: (NOTE) SARS-CoV-2 target nucleic acids are NOT DETECTED.  The SARS-CoV-2 RNA is generally detectable in upper respiratory specimens during the acute phase  of infection. The lowest concentration of SARS-CoV-2 viral copies this assay can detect is 138 copies/mL. A negative result does not preclude SARS-Cov-2 infection and should not be used as the sole basis for treatment or other patient management decisions. A negative result may occur with  improper specimen collection/handling, submission of specimen other than nasopharyngeal swab, presence of viral mutation(s) within the areas targeted by this assay, and inadequate number of viral copies(<138 copies/mL). A negative result must be combined with clinical observations, patient history, and epidemiological information. The expected result is Negative.  Fact Sheet for Patients:  EntrepreneurPulse.com.au  Fact Sheet for Healthcare Providers:  IncredibleEmployment.be  This test is no t yet approved or cleared by the [place] FDA and  has been authorized for detection and/or diagnosis of SARS-CoV-2 by FDA under an  Emergency Use Authorization (EUA). This EUA will remain  in effect (meaning this test can be used) for the duration of the COVID-19 declaration under Section 564(b)(1) of the Act, 21 U.S.C.section 360bbb-3(b)(1), unless the authorization is terminated  or revoked sooner.       Influenza A by PCR NEGATIVE NEGATIVE Final   Influenza B by PCR NEGATIVE NEGATIVE Final    Comment: (NOTE) The Xpert Xpress SARS-CoV-2/FLU/RSV plus assay is intended as an aid in the diagnosis of influenza from Nasopharyngeal swab specimens and should not be used as a sole basis for treatment. Nasal washings and aspirates are unacceptable for Xpert Xpress SARS-CoV-2/FLU/RSV testing.  Fact Sheet for Patients: EntrepreneurPulse.com.au  Fact Sheet for Healthcare Providers: IncredibleEmployment.be  This test is not yet approved or cleared by the [place] FDA and has been authorized for detection and/or diagnosis of SARS-CoV-2 by FDA under an Emergency Use Authorization (EUA). This EUA will remain in effect (meaning this test can be used) for the duration of the COVID-19 declaration under Section 564(b)(1) of the Act, 21 U.S.C. section 360bbb-3(b)(1), unless the authorization is terminated or revoked.  Performed at [facility], [address], [place],  [address]          Radiology Studies: DG Ribs Unilateral W/Chest Right  Result Date: 10/05/2020 CLINICAL DATA:  [place] from scooter yesterday, RIGHT back pain EXAM: RIGHT RIBS AND CHEST - 3+ VIEW COMPARISON:  11/21/2019 FINDINGS: Loop recorder projects over cardiac silhouette. Upper normal size of cardiac silhouette post CABG. Tortuous aorta. Mediastinal contours and pulmonary vascularity otherwise normal. Peribronchial thickening with scattered interstitial prominence at the lower lungs unchanged. No acute infiltrate, pleural effusion, or pneumothorax. Bowel interposition between liver and  diaphragm. Bones demineralized. No definite rib fracture or bone destruction. Scattered degenerative changes of thoracolumbar spine. IMPRESSION: No acute abnormalities. Electronically Signed   By: Lavonia Dana M.D.   On: 10/05/2020 12:41   DG Thoracic Spine 2 View  Result Date: 10/05/2020 CLINICAL DATA:  RIGHT-side back pain, fell from scooter yesterday EXAM: THORACIC SPINE 2 VIEWS COMPARISON:  Chest radiograph 11/29/2018 FINDINGS: Loop recorder projects over LEFT chest. Post CABG. Vertebral body heights maintained without fracture or subluxation. Multilevel endplate spur formation. Visualized ribs grossly intact. IMPRESSION: No acute abnormalities. Osseous demineralization with degenerative disc disease changes thoracic spine. Electronically Signed   By: Lavonia Dana M.D.   On: 10/05/2020 12:42   DG Lumbar Spine Complete  Result Date: 10/05/2020 CLINICAL DATA:  [place] from scooter yesterday, RIGHT-side back pain EXAM: LUMBAR SPINE - COMPLETE 4+ VIEW COMPARISON:  05/09/2019 FINDINGS: 5 non-rib-bearing lumbar vertebra. Chronic dextroconvex scoliosis. Multilevel disc space narrowing and endplate spur formation. Multilevel facet degenerative  changes. Retrolisthesis L4-L5 unchanged. No fracture, additional subluxation, or bone destruction. No spondylolysis. Suspected aneurysmal dilatation of the abdominal aorta 5.9 cm AP radiographically, though this contains inherent radiographic magnification. Also suspected aneurysmal dilatation of a common iliac artery. IMPRESSION: Osseous demineralization with chronic degenerative disc/facet disease changes of lumbar spine with dextroconvex scoliosis. No acute osseous abnormalities. Suspected abdominal aortic aneurysm 5.9 cm AP radiographically; ultrasound or CT evaluation recommended. Findings called to Dr. Sherry Ruffing on 10/05/2020 at 1246 hours. Electronically Signed   By: Lavonia Dana M.D.   On: 10/05/2020 12:46   CT Angio Chest/Abd/Pel for Dissection W and/or Wo  Contrast  Result Date: 10/05/2020 CLINICAL DATA:  Chest and back pain. Fell from scooter a few days ago. Known AAA. EXAM: CT ANGIOGRAPHY CHEST, ABDOMEN AND PELVIS TECHNIQUE: Multidetector CT imaging through the chest, abdomen and pelvis was performed using the standard protocol during bolus administration of intravenous contrast. Multiplanar reconstructed images and MIPs were obtained and reviewed to evaluate the vascular anatomy. CONTRAST:  70mL OMNIPAQUE IOHEXOL 350 MG/ML SOLN COMPARISON:  CT scan 05/17/2017 FINDINGS: CTA CHEST FINDINGS Cardiovascular: The heart is mildly enlarged. No pericardial effusion. There is moderate tortuosity and atherosclerotic disease involving the thoracic aorta but no focal aneurysm or dissection. Surgical changes from coronary artery bypass surgery with three-vessel coronary artery calcifications. The aortic branch vessels are patent. The pulmonary arterial tree is well opacified. No findings for pulmonary embolism. Mediastinum/Nodes: No mediastinal or hilar mass or lymphadenopathy. The esophagus is grossly normal. Lungs/Pleura: Moderate emphysematous changes and pulmonary scarring. No infiltrates, edema or effusions. No pneumothorax. No worrisome pulmonary lesions. 6 mm perifissural nodule along the minor fissure contains a small calcification and is likely benign. Musculoskeletal: No chest wall mass or hematoma. No findings suspicious for sternal or rib fracture. The thoracic vertebral bodies are normally aligned. No acute fracture. Review of the MIP images confirms the above findings. CTA ABDOMEN AND PELVIS FINDINGS VASCULAR Aorta: Tortuosity and ectasia of the abdominal aorta with moderate scattered atherosclerotic calcifications. There is a infrarenal abdominal aortic aneurysm with maximum measurements of 4.5 x 4.1 cm. No dissection. No involvement of the bifurcation. Celiac: Patent.  Mild ostial calcifications. SMA: Patent.  Mild calcifications at the ostia. Renals: Patent.  Mild ostial calcifications bilaterally. Mild proximal stenosis of both vessels. IMA: Patent Inflow: Fusiform aneurysmal dilatation both common iliac arteries with maximum diameter of 3 cm on the left and 28.5 mm on the right. Bilateral internal iliac artery aneurysms. The right measures 2.0 cm and the left 2.2 cm. Veins: Grossly normal. Review of the MIP images confirms the above findings. NON-VASCULAR Hepatobiliary: No hepatic lesions or acute hepatic injury. The gallbladder demonstrates small layering gallstones. No common bile duct dilatation. Pancreas: Moderate pancreatic atrophy but no mass or inflammation. Spleen: Normal size.  No acute injury. Adrenals/Urinary Tract: The adrenal glands and kidneys are unremarkable. No renal lesions or hydronephrosis. No perinephric hematoma. The bladder is unremarkable. Stomach/Bowel: The stomach, duodenum, small bowel and colon are grossly normal. Lymphatic: No abdominal or pelvic lymphadenopathy. Reproductive: Mild prostate gland enlargement. The seminal vesicles are unremarkable. Other: No pelvic mass or adenopathy. No free pelvic fluid collections. No inguinal mass or adenopathy. No abdominal wall hernia or subcutaneous lesions. Musculoskeletal: Lumbar scoliosis and degenerative lumbar spondylosis but no acute bony findings. Right hip hardware noted. No complicating features. No pelvic fractures. Review of the MIP images confirms the above findings. IMPRESSION: 1. Moderate tortuosity and atherosclerotic disease involving the thoracic aorta but no focal aneurysm or dissection. 2.  Surgical changes from coronary artery bypass surgery with three-vessel coronary artery calcifications. 3. Moderate emphysematous changes and pulmonary scarring. No acute pulmonary findings or worrisome pulmonary lesions. 4. 4.5 x 4.1 cm infrarenal abdominal aortic aneurysm Recommend follow-up CT/MR every 6 months and vascular consultation. This recommendation follows ACR consensus guidelines:  White Paper of the ACR Incidental Findings Committee II on Vascular Findings. J Am Coll Radiol 2013; 10:789-794. 5. Bilateral common iliac artery aneurysms and bilateral internal iliac artery aneurysms. 6. No acute abdominal/pelvic findings, mass lesions or adenopathy. Aortic Atherosclerosis (ICD10-I70.0) and Emphysema (ICD10-J43.9). Electronically Signed   By: Marijo Sanes M.D.   On: 10/05/2020 14:33    Scheduled Meds: . amLODipine  5 mg Oral Daily  . enoxaparin (LOVENOX) injection  40 mg Subcutaneous QHS  . insulin aspart  0-15 Units Subcutaneous TID WC  . insulin aspart  0-5 Units Subcutaneous QHS  . irbesartan  300 mg Oral Daily  . levothyroxine  25 mcg Oral Q0600  . memantine  10 mg Oral BID  . pantoprazole  40 mg Oral Daily    LOS: 0 days   Time spent: 3min  Lydia Toren C Haruna Rohlfs, DO [facility]  If 7PM-7AM, please contact night-coverage [email or web address]  10/06/2020, 7:22 AM

## 2020-10-07 ENCOUNTER — Telehealth: Payer: Self-pay | Admitting: Physical Medicine and Rehabilitation

## 2020-10-07 ENCOUNTER — Other Ambulatory Visit: Payer: Self-pay | Admitting: Internal Medicine

## 2020-10-07 DIAGNOSIS — E782 Mixed hyperlipidemia: Secondary | ICD-10-CM

## 2020-10-07 DIAGNOSIS — I160 Hypertensive urgency: Secondary | ICD-10-CM | POA: Diagnosis not present

## 2020-10-07 LAB — GLUCOSE, CAPILLARY: Glucose-Capillary: 93 mg/dL (ref 70–99)

## 2020-10-07 LAB — HEMOGLOBIN A1C
Hgb A1c MFr Bld: 5.6 % (ref 4.8–5.6)
Mean Plasma Glucose: 114 mg/dL

## 2020-10-07 MED ORDER — AMLODIPINE BESYLATE 10 MG PO TABS: 10.0000 mg | ORAL_TABLET | Freq: Every day | ORAL | 0 refills | Status: DC

## 2020-10-07 MED ORDER — AMLODIPINE BESYLATE 10 MG PO TABS
10.0000 mg | ORAL_TABLET | Freq: Every day | ORAL | Status: DC
  Administered 2020-10-07: 10 mg via ORAL
  Filled 2020-10-07: qty 1

## 2020-10-07 MED ORDER — HYDRALAZINE HCL 25 MG PO TABS: 25.0000 mg | ORAL_TABLET | Freq: Three times a day (TID) | ORAL | 0 refills | Status: DC

## 2020-10-07 NOTE — Telephone Encounter (Signed)
[name] called advising that [name] is no longer in the hospital and would like to get his previous appt rescheduled with Dr. Ernestina Patches. The best call back number is [phone].

## 2020-10-07 NOTE — Progress Notes (Signed)
Patient d/c from 4E. Discharge instructions and education of medication given to pt and wife.

## 2020-10-07 NOTE — Progress Notes (Addendum)
Pt states pain is better. MRI reveals degenerative disease.    hgb stable.  Doubtful pain is from AAA.   He will keep his appt in August for duplex of AAA.  Discussed if he develops sudden severe onset of back or abdominal pain, he is to call 911.  He and his wife expressed understanding.   Leontine Locket, [facility] 10/07/2020 8:23 AM  VASCULAR STAFF ADDENDUM: I have independently interviewed and examined the patient. I agree with the above.  Reviewed images again. Reviewed MRI. I suspect his minor fall is likely the cause of his pain. He has no tenderness when I directly palpate the aneurysm through his abdominal wall. He is safe for discharge with close interval follow up.  Yevonne Aline. Stanford Breed, MD [facility] Phone Number: [phone] 10/07/2020 8:38 AM

## 2020-10-07 NOTE — Discharge Summary (Signed)
Physician Discharge Summary  CELIA GIBBONS PPI:[id number] DOB: Apr 12, 1934 DOA: 10/05/2020  PCP: [name], MD  Admit date: 10/05/2020 Discharge date: 10/07/2020  Admitted From: Home Disposition: Home  Recommendations for Outpatient Follow-up:  1. Follow up with PCP in 1-2 weeks 2. Please obtain BMP/CBC in one week 3. Please follow up with orthopedics as scheduled  4. Follow-up with vascular as scheduled  Home Health: None Equipment/Devices: No new equipment  Discharge Condition: Stable CODE STATUS: DNR Diet recommendation: As tolerated  Brief/Interim Summary: Sohaib Vereen [name] a 87 y.o.malewith medical history significant ofcoronary artery disease status post quadruple bypass in March 2017, bilateral iliac artery aneurysms, left common iliac artery aneurysm, vascular dementia diabetes, duodenal ulcer, GERD, hyperlipidemia, carotid artery disease, essential hypertension, history of TIA, history of IBS, [facility].   Patient fell over the weekend off his scooter into grass, no other injuries.   Presented to ED for worsening back pain that was uncontrolled at home, CT scan showed his infrarenal aortic aneurysm now at 4.5 cm.  Given unclear nature of back pain whether this is secondary to worsening aneurysm or more mechanical pain in nature he was consulted to vascular surgery evaluation and hospitalist was asked for admission.  Patient presented as above with hypertensive urgency after mechanical fall with known chronic back pain as well as abdominal aortic aneurysm.  Imaging at previous facility shows 4.5 cm infrarenal aortic aneurysm concerning for etiology of patient's back pain an MRI was taken here showing no acute findings but extensive chronic dysfunction likely the cause of patient's pain.  His blood pressure has been much more well controlled since admission.  No current further evaluation or imaging or  intervention per vascular surgery, otherwise patient has close follow-up with orthopedic surgeon for possible spinal injection in the next 1 to 2 weeks.  Follow-up with outpatient vascular for repeat imaging evaluation per their expertise.  Lengthy discussion at bedside today about need for blood pressure control given his known aneurysm.  Patient's other chronic conditions including CKD 2 diabetes GERD hyperlipidemia dementia and hypothyroidism appear to be stable and at baseline.  Discharge Diagnoses:  Principal Problem:   Hypertensive urgency Active Problems:   Coronary atherosclerosis- s/p PCI to LAD in 2009 and PCI to RCA in 2011   Abdominal aortic aneurysm (HCC)   GERD   Diabetes mellitus type 2, diet-controlled ([place])   CKD stage 3 due to type 2 diabetes mellitus ([place])   DDD (degenerative disc disease), lumbar   Low back pain   Hyperlipidemia, mixed   At high risk for falls    Discharge Instructions  Discharge Instructions    Call MD for:  severe uncontrolled pain   Complete by: As directed    Diet - low sodium heart healthy   Complete by: As directed    Increase activity slowly   Complete by: As directed      Allergies as of 10/07/2020      Reactions   Other Other (See Comments)   Pt reports allergic to a medication but unsure of name or type of med   Cymbalta [duloxetine Hcl] Other (See Comments)   Dizziness, hallucinations.    Keflex [cephalexin] Other (See Comments)   dizziness   Simvastatin Other (See Comments)   Joint pain   Sudafed [pseudoephedrine] Other (See Comments)   Dizziness      Medication List    STOP taking these medications   fludrocortisone 0.1 MG tablet  Commonly known as: FLORINEF     TAKE these medications   acetaminophen 500 MG tablet Commonly known as: TYLENOL Take 1,000 mg by mouth in the morning and at bedtime.   amLODipine 10 MG tablet Commonly known as: NORVASC Take 1 tablet (10 mg total) by mouth daily. Start taking on: October 08, 2020 What changed:   medication strength  how much to take  how to take this  when to take this  additional instructions   aspirin EC 81 MG tablet Take 2 tablets (162 mg total) by mouth daily. What changed: how much to take   buPROPion 150 MG 24 hr tablet Commonly known as: WELLBUTRIN XL TAKE 1 TABLET BY MOUTH EVERY DAY IN THE MORNING   butalbital-acetaminophen-caffeine 50-325-40 MG tablet Commonly known as: FIORICET Take     1 tablet      every 4 hours       ONLY       if needed for Severe Headache   CALCIUM 1200 PO Take 1 tablet by mouth daily.   ferrous sulfate 325 (65 FE) MG tablet Take 325 mg by mouth daily with breakfast.   glucose blood test strip Check blood sugar 1 time daily-[id number] What changed:   how much to take  how to take this  when to take this  reasons to take this   hydrALAZINE 25 MG tablet Commonly known as: APRESOLINE Take 1 tablet (25 mg total) by mouth every 8 (eight) hours.   levothyroxine 25 MCG tablet Commonly known as: SYNTHROID TAKE 1 TABLET BY MOUTH EVERY DAY ON EMPTY STOMACH WITH ONLY WATER FOR 30 MINS   meloxicam 15 MG tablet Commonly known as: MOBIC Take  1/2 to 1 tablet  Daily  with Food  for Pain & Inflammation   memantine 10 MG tablet Commonly known as: NAMENDA TAKE 1 TABLET BY MOUTH TWICE A DAY   multivitamin with minerals Tabs tablet Take 1 tablet by mouth daily.   olmesartan 40 MG tablet Commonly known as: BENICAR Take 20-40 mg by mouth daily as needed (high blood pressure).   pantoprazole 40 MG tablet Commonly known as: PROTONIX Take      1 tablet      Daily       to Prevent Indigestion & Heartburn   polyethylene glycol 17 g packet Commonly known as: MIRALAX / GLYCOLAX Take 17 g by mouth daily. What changed: when to take this   Potassium 99 MG Tabs Take 1 tablet by mouth daily.   pravastatin 40 MG tablet Commonly known as: PRAVACHOL TAKE 1 TABLET BY MOUTH AT BEDTIME FOR CHOLESTEROL What changed:  additional instructions   tamsulosin 0.4 MG Caps capsule Commonly known as: FLOMAX TAKE 1 TABLET 1 HOUR BEFORE BEDTIME FOR BLADDER EMPTYING   Vitamin B-12 3000 MCG Subl Place 1 tablet under the tongue daily.   Vitamin D-3 125 MCG (5000 UT) Tabs Take 5,000 Units by mouth daily.   Zinc 50 MG Tabs Take 1 tablet by mouth daily.       Follow-up Information    [facility] In 2 months.   Specialty: Vascular Surgery Why: Office will call you to arrange your appt (sent) Contact information: [place] [address] [phone]             Allergies  Allergen Reactions  . Other Other (See Comments)    Pt reports allergic to a medication but unsure of name or type of med  .  Cymbalta [Duloxetine Hcl] Other (See Comments)    Dizziness, hallucinations.   . Keflex [Cephalexin] Other (See Comments)    dizziness  . Simvastatin Other (See Comments)    Joint pain  . Sudafed [Pseudoephedrine] Other (See Comments)    Dizziness    Consultations:  Vascular surgery   Procedures/Studies: DG Ribs Unilateral W/Chest Right  Result Date: 10/05/2020 CLINICAL DATA:  [place] from scooter yesterday, RIGHT back pain EXAM: RIGHT RIBS AND CHEST - 3+ VIEW COMPARISON:  11/21/2019 FINDINGS: Loop recorder projects over cardiac silhouette. Upper normal size of cardiac silhouette post CABG. Tortuous aorta. Mediastinal contours and pulmonary vascularity otherwise normal. Peribronchial thickening with scattered interstitial prominence at the lower lungs unchanged. No acute infiltrate, pleural effusion, or pneumothorax. Bowel interposition between liver and diaphragm. Bones demineralized. No definite rib fracture or bone destruction. Scattered degenerative changes of thoracolumbar spine. IMPRESSION: No acute abnormalities. Electronically Signed   By: Lavonia Dana M.D.   On: 10/05/2020 12:41   DG Thoracic Spine 2 View  Result Date: 10/05/2020 CLINICAL  DATA:  RIGHT-side back pain, fell from scooter yesterday EXAM: THORACIC SPINE 2 VIEWS COMPARISON:  Chest radiograph 11/29/2018 FINDINGS: Loop recorder projects over LEFT chest. Post CABG. Vertebral body heights maintained without fracture or subluxation. Multilevel endplate spur formation. Visualized ribs grossly intact. IMPRESSION: No acute abnormalities. Osseous demineralization with degenerative disc disease changes thoracic spine. Electronically Signed   By: Lavonia Dana M.D.   On: 10/05/2020 12:42   DG Lumbar Spine Complete  Result Date: 10/05/2020 CLINICAL DATA:  [place] from scooter yesterday, RIGHT-side back pain EXAM: LUMBAR SPINE - COMPLETE 4+ VIEW COMPARISON:  05/09/2019 FINDINGS: 5 non-rib-bearing lumbar vertebra. Chronic dextroconvex scoliosis. Multilevel disc space narrowing and endplate spur formation. Multilevel facet degenerative changes. Retrolisthesis L4-L5 unchanged. No fracture, additional subluxation, or bone destruction. No spondylolysis. Suspected aneurysmal dilatation of the abdominal aorta 5.9 cm AP radiographically, though this contains inherent radiographic magnification. Also suspected aneurysmal dilatation of a common iliac artery. IMPRESSION: Osseous demineralization with chronic degenerative disc/facet disease changes of lumbar spine with dextroconvex scoliosis. No acute osseous abnormalities. Suspected abdominal aortic aneurysm 5.9 cm AP radiographically; ultrasound or CT evaluation recommended. Findings called to Dr. Sherry Ruffing on 10/05/2020 at 1246 hours. Electronically Signed   By: Lavonia Dana M.D.   On: 10/05/2020 12:46   MR Lumbar Spine W Wo Contrast  Result Date: 10/06/2020 CLINICAL DATA:  Low back pain, fall from scooter recently EXAM: MRI LUMBAR SPINE WITHOUT AND WITH CONTRAST TECHNIQUE: Multiplanar and multiecho pulse sequences of the lumbar spine were obtained without and with intravenous contrast. CONTRAST:  72mL GADAVIST GADOBUTROL 1 MMOL/ML IV SOLN COMPARISON:  03/22/2020  FINDINGS: Segmentation:  Standard. Alignment: Dextrocurvature. Similar anteroposterior alignment with minor listhesis, for example retrolisthesis at L4-L5. Vertebrae: Similar vertebral body heights with degenerative endplate irregularity and chronic appearing degenerative marrow changes. No marrow edema. No suspicious osseous lesion. Conus medullaris and cauda equina: Conus extends to the L1-L2 level. Conus and cauda equina appear normal. No abnormal intrathecal enhancement. Paraspinal and other soft tissues: Aneurysmal dilatation of the abdominal aorta and common iliacs. The aorta measures up to 4.4 cm above the bifurcation. These findings are discussed on recent CT imaging with follow-up recommendation provided. Disc levels: L1-L2: Small right foraminal protrusion. No canal or foraminal stenosis. Appearance is similar. L2-L3: Disc bulge slightly eccentric to the right. No canal or foraminal stenosis. Appearance is similar. L3-L4: Disc bulge with endplate osteophytic ridging eccentric to the left. Punctate central annular fissure. No canal stenosis. Slight  effacement of the left subarticular recess. Minor right and moderate left foraminal stenosis. Appearance is similar. L4-L5: Disc bulge with endplate osteophytic ridging eccentric to the left. Facet arthropathy with ligamentum flavum infolding. Slightly increased mild canal stenosis. Partial effacement of the subarticular recesses. Moderate right and moderate to marked left foraminal stenosis. Appearance is similar. L5-S1: Disc bulge with endplate osteophytic ridging slightly eccentric to the right. Facet arthropathy. No canal stenosis. Slight effacement of the subarticular recesses. Moderate right and mild left foraminal stenosis. Appearance is similar. IMPRESSION: No new compression deformity. Multilevel degenerative changes as detailed above are overall similar in appearance to the prior study other than slightly increased mild canal stenosis at L4-L5.  Electronically Signed   By: Macy Mis M.D.   On: 10/06/2020 15:58   EEG adult  Result Date: 09/21/2020 Kathrynn Ducking, MD     10/06/2020  4:00 PM History: Evon Lopezperez is an 87 year old gentleman with a history of episodes of altered mental status and confusion, he was seen in the emergency room on 25 Sep 2019 for this reason.  He was noted to have some left upper extremity drift as well.  He has some underlying cognitive impairment.  He is being evaluated for these issues. This is a routine EEG.  No skull defects are noted.  Medications include aspirin, Wellbutrin, Fioricet, vitamin D, Florinef, Synthroid, Mobic, Namenda, multivitamins, Benicar, Protonix, potassium, Pravachol, Flomax, Norvasc, iron supplementation, Namenda, and vitamin B12. EEG classification: Normal awake Description of the recording: The background rhythms of this recording consists of a fairly well modulated medium amplitude alpha rhythm of 10 Hz that is reactive to eye opening and closure. As the record progresses, the patient appears to remain in the waking state throughout the recording. Photic stimulation was performed, resulting in a bilateral and symmetric photic driving response. Hyperventilation was also performed, resulting in a minimal buildup of the background rhythm activities without significant slowing seen. At no time during the recording does there appear to be evidence of spike or spike wave discharges or evidence of focal slowing. EKG monitor shows no evidence of cardiac rhythm abnormalities with a heart rate of 72. Impression: This is a normal EEG recording in the waking state. No evidence of ictal or interictal discharges are seen.   CUP PACEART REMOTE DEVICE CHECK  Result Date: 09/24/2020 ILR summary report received. Battery status OK. Normal device function. No new symptom, tachy, brady, or pause episodes. No new AF episodes. Monthly summary reports and ROV/PRN. RP  CT Angio Chest/Abd/Pel for Dissection W  and/or Wo Contrast  Result Date: 10/05/2020 CLINICAL DATA:  Chest and back pain. Fell from scooter a few days ago. Known AAA. EXAM: CT ANGIOGRAPHY CHEST, ABDOMEN AND PELVIS TECHNIQUE: Multidetector CT imaging through the chest, abdomen and pelvis was performed using the standard protocol during bolus administration of intravenous contrast. Multiplanar reconstructed images and MIPs were obtained and reviewed to evaluate the vascular anatomy. CONTRAST:  34mL OMNIPAQUE IOHEXOL 350 MG/ML SOLN COMPARISON:  CT scan 05/17/2017 FINDINGS: CTA CHEST FINDINGS Cardiovascular: The heart is mildly enlarged. No pericardial effusion. There is moderate tortuosity and atherosclerotic disease involving the thoracic aorta but no focal aneurysm or dissection. Surgical changes from coronary artery bypass surgery with three-vessel coronary artery calcifications. The aortic branch vessels are patent. The pulmonary arterial tree is well opacified. No findings for pulmonary embolism. Mediastinum/Nodes: No mediastinal or hilar mass or lymphadenopathy. The esophagus is grossly normal. Lungs/Pleura: Moderate emphysematous changes and pulmonary scarring. No infiltrates, edema or effusions.  No pneumothorax. No worrisome pulmonary lesions. 6 mm perifissural nodule along the minor fissure contains a small calcification and is likely benign. Musculoskeletal: No chest wall mass or hematoma. No findings suspicious for sternal or rib fracture. The thoracic vertebral bodies are normally aligned. No acute fracture. Review of the MIP images confirms the above findings. CTA ABDOMEN AND PELVIS FINDINGS VASCULAR Aorta: Tortuosity and ectasia of the abdominal aorta with moderate scattered atherosclerotic calcifications. There is a infrarenal abdominal aortic aneurysm with maximum measurements of 4.5 x 4.1 cm. No dissection. No involvement of the bifurcation. Celiac: Patent.  Mild ostial calcifications. SMA: Patent.  Mild calcifications at the ostia. Renals:  Patent. Mild ostial calcifications bilaterally. Mild proximal stenosis of both vessels. IMA: Patent Inflow: Fusiform aneurysmal dilatation both common iliac arteries with maximum diameter of 3 cm on the left and 28.5 mm on the right. Bilateral internal iliac artery aneurysms. The right measures 2.0 cm and the left 2.2 cm. Veins: Grossly normal. Review of the MIP images confirms the above findings. NON-VASCULAR Hepatobiliary: No hepatic lesions or acute hepatic injury. The gallbladder demonstrates small layering gallstones. No common bile duct dilatation. Pancreas: Moderate pancreatic atrophy but no mass or inflammation. Spleen: Normal size.  No acute injury. Adrenals/Urinary Tract: The adrenal glands and kidneys are unremarkable. No renal lesions or hydronephrosis. No perinephric hematoma. The bladder is unremarkable. Stomach/Bowel: The stomach, duodenum, small bowel and colon are grossly normal. Lymphatic: No abdominal or pelvic lymphadenopathy. Reproductive: Mild prostate gland enlargement. The seminal vesicles are unremarkable. Other: No pelvic mass or adenopathy. No free pelvic fluid collections. No inguinal mass or adenopathy. No abdominal wall hernia or subcutaneous lesions. Musculoskeletal: Lumbar scoliosis and degenerative lumbar spondylosis but no acute bony findings. Right hip hardware noted. No complicating features. No pelvic fractures. Review of the MIP images confirms the above findings. IMPRESSION: 1. Moderate tortuosity and atherosclerotic disease involving the thoracic aorta but no focal aneurysm or dissection. 2. Surgical changes from coronary artery bypass surgery with three-vessel coronary artery calcifications. 3. Moderate emphysematous changes and pulmonary scarring. No acute pulmonary findings or worrisome pulmonary lesions. 4. 4.5 x 4.1 cm infrarenal abdominal aortic aneurysm Recommend follow-up CT/MR every 6 months and vascular consultation. This recommendation follows ACR consensus  guidelines: White Paper of the ACR Incidental Findings Committee II on Vascular Findings. J Am Coll Radiol 2013; 10:789-794. 5. Bilateral common iliac artery aneurysms and bilateral internal iliac artery aneurysms. 6. No acute abdominal/pelvic findings, mass lesions or adenopathy. Aortic Atherosclerosis (ICD10-I70.0) and Emphysema (ICD10-J43.9). Electronically Signed   By: Marijo Sanes M.D.   On: 10/05/2020 14:33     Subjective: No acute issues or events overnight denies nausea vomiting diarrhea constipation headache fevers chills chest pain or shortness of breath.  Lumbago ongoing but improving.   Discharge Exam: Vitals:   10/07/20 0348 10/07/20 0900  BP: (!) 149/83 111/62  Pulse: 76 72  Resp: 16 18  Temp: 98.4 F (36.9 C) 97.9 F (36.6 C)  SpO2: 98% 96%   Vitals:   10/06/20 2005 10/06/20 2326 10/07/20 0348 10/07/20 0900  BP: (!) 145/82 (!) 155/76 (!) 149/83 111/62  Pulse: 77 75 76 72  Resp: 16 16 16 18   Temp: 98.1 F (36.7 C) 98.7 F (37.1 C) 98.4 F (36.9 C) 97.9 F (36.6 C)  TempSrc: Oral Oral Oral Oral  SpO2: 98% 98% 98% 96%  Weight:      Height:        General: Pt is alert, awake, not in acute distress Cardiovascular:  RRR, S1/S2 +, no rubs, no gallops Respiratory: CTA bilaterally, no wheezing, no rhonchi Abdominal: Soft, NT, ND, bowel sounds + Extremities: no edema, no cyanosis    The results of significant diagnostics from this hospitalization (including imaging, microbiology, ancillary and laboratory) are listed below for reference.     Microbiology: Recent Results (from the past 240 hour(s))  Resp Panel by RT-PCR (Flu A&B, Covid) Nasopharyngeal Swab     Status: None   Collection Time: 10/05/20 12:48 PM   Specimen: Nasopharyngeal Swab; Nasopharyngeal(NP) swabs in vial transport medium  Result Value Ref Range Status   SARS Coronavirus 2 by RT PCR NEGATIVE NEGATIVE Final    Comment: (NOTE) SARS-CoV-2 target nucleic acids are NOT DETECTED.  The SARS-CoV-2  RNA is generally detectable in upper respiratory specimens during the acute phase of infection. The lowest concentration of SARS-CoV-2 viral copies this assay can detect is 138 copies/mL. A negative result does not preclude SARS-Cov-2 infection and should not be used as the sole basis for treatment or other patient management decisions. A negative result may occur with  improper specimen collection/handling, submission of specimen other than nasopharyngeal swab, presence of viral mutation(s) within the areas targeted by this assay, and inadequate number of viral copies(<138 copies/mL). A negative result must be combined with clinical observations, patient history, and epidemiological information. The expected result is Negative.  Fact Sheet for Patients:  EntrepreneurPulse.com.au  Fact Sheet for Healthcare Providers:  IncredibleEmployment.be  This test is no t yet approved or cleared by the [place] FDA and  has been authorized for detection and/or diagnosis of SARS-CoV-2 by FDA under an Emergency Use Authorization (EUA). This EUA will remain  in effect (meaning this test can be used) for the duration of the COVID-19 declaration under Section 564(b)(1) of the Act, 21 U.S.C.section 360bbb-3(b)(1), unless the authorization is terminated  or revoked sooner.       Influenza A by PCR NEGATIVE NEGATIVE Final   Influenza B by PCR NEGATIVE NEGATIVE Final    Comment: (NOTE) The Xpert Xpress SARS-CoV-2/FLU/RSV plus assay is intended as an aid in the diagnosis of influenza from Nasopharyngeal swab specimens and should not be used as a sole basis for treatment. Nasal washings and aspirates are unacceptable for Xpert Xpress SARS-CoV-2/FLU/RSV testing.  Fact Sheet for Patients: EntrepreneurPulse.com.au  Fact Sheet for Healthcare Providers: IncredibleEmployment.be  This test is not yet approved or cleared by the  [place] FDA and has been authorized for detection and/or diagnosis of SARS-CoV-2 by FDA under an Emergency Use Authorization (EUA). This EUA will remain in effect (meaning this test can be used) for the duration of the COVID-19 declaration under Section 564(b)(1) of the Act, 21 U.S.C. section 360bbb-3(b)(1), unless the authorization is terminated or revoked.  Performed at [facility], [address] [place], [place] [address]      Labs: BNP (last 3 results) No results for input(s): BNP in the last 8760 hours. Basic Metabolic Panel: Recent Labs  Lab 10/05/20 1248 10/05/20 2316  NA 139 136  K 4.4 3.8  CL 102 102  CO2 29 25  GLUCOSE 107* 115*  BUN 32* 30*  CREATININE 2.03* 1.99*  CALCIUM 9.1 8.5*   Liver Function Tests: Recent Labs  Lab 10/05/20 1248 10/05/20 2316  AST 12* 15  ALT 6 8  ALKPHOS 103 102  BILITOT 0.5 0.7  PROT 6.0* 5.7*  ALBUMIN 3.7 3.3*   No results for input(s): LIPASE, AMYLASE in the last 168 hours. No results for  input(s): AMMONIA in the last 168 hours. CBC: Recent Labs  Lab 10/05/20 1248 10/05/20 2316  WBC 6.6 8.4  NEUTROABS 4.0  --   HGB 11.3* 11.4*  HCT 35.4* 36.2*  MCV 100.0 100.8*  PLT 128* 143*   Cardiac Enzymes: No results for input(s): CKTOTAL, CKMB, CKMBINDEX, TROPONINI in the last 168 hours. BNP: Invalid input(s): POCBNP CBG: Recent Labs  Lab 10/06/20 0612 10/06/20 1140 10/06/20 1637 10/06/20 2143 10/07/20 0619  GLUCAP 99 114* 133* 84 93   D-Dimer No results for input(s): DDIMER in the last 72 hours. Hgb A1c Recent Labs    10/05/20 2316  HGBA1C 5.6   Lipid Profile No results for input(s): CHOL, HDL, LDLCALC, TRIG, CHOLHDL, LDLDIRECT in the last 72 hours. Thyroid function studies No results for input(s): TSH, T4TOTAL, T3FREE, THYROIDAB in the last 72 hours.  Invalid input(s): FREET3 Anemia work up No results for input(s): VITAMINB12, FOLATE, FERRITIN, TIBC, IRON, RETICCTPCT in the  last 72 hours. Urinalysis    Component Value Date/Time   COLORURINE YELLOW 01/30/2020 1434   APPEARANCEUR CLEAR 01/30/2020 1434   LABSPEC 1.011 01/30/2020 1434   PHURINE 6.5 01/30/2020 1434   GLUCOSEU NEGATIVE 01/30/2020 1434   HGBUR NEGATIVE 01/30/2020 1434   BILIRUBINUR NEGATIVE 11/21/2019 0822   KETONESUR NEGATIVE 01/30/2020 1434   PROTEINUR NEGATIVE 01/30/2020 1434   UROBILINOGEN 1.0 01/05/2015 1550   NITRITE NEGATIVE 01/22/2020 1605   LEUKOCYTESUR TRACE (A) 01/22/2020 1605   Sepsis Labs Invalid input(s): PROCALCITONIN,  WBC,  LACTICIDVEN Microbiology Recent Results (from the past 240 hour(s))  Resp Panel by RT-PCR (Flu A&B, Covid) Nasopharyngeal Swab     Status: None   Collection Time: 10/05/20 12:48 PM   Specimen: Nasopharyngeal Swab; Nasopharyngeal(NP) swabs in vial transport medium  Result Value Ref Range Status   SARS Coronavirus 2 by RT PCR NEGATIVE NEGATIVE Final    Comment: (NOTE) SARS-CoV-2 target nucleic acids are NOT DETECTED.  The SARS-CoV-2 RNA is generally detectable in upper respiratory specimens during the acute phase of infection. The lowest concentration of SARS-CoV-2 viral copies this assay can detect is 138 copies/mL. A negative result does not preclude SARS-Cov-2 infection and should not be used as the sole basis for treatment or other patient management decisions. A negative result may occur with  improper specimen collection/handling, submission of specimen other than nasopharyngeal swab, presence of viral mutation(s) within the areas targeted by this assay, and inadequate number of viral copies(<138 copies/mL). A negative result must be combined with clinical observations, patient history, and epidemiological information. The expected result is Negative.  Fact Sheet for Patients:  EntrepreneurPulse.com.au  Fact Sheet for Healthcare Providers:  IncredibleEmployment.be  This test is no t yet approved or  cleared by the [place] FDA and  has been authorized for detection and/or diagnosis of SARS-CoV-2 by FDA under an Emergency Use Authorization (EUA). This EUA will remain  in effect (meaning this test can be used) for the duration of the COVID-19 declaration under Section 564(b)(1) of the Act, 21 U.S.C.section 360bbb-3(b)(1), unless the authorization is terminated  or revoked sooner.       Influenza A by PCR NEGATIVE NEGATIVE Final   Influenza B by PCR NEGATIVE NEGATIVE Final    Comment: (NOTE) The Xpert Xpress SARS-CoV-2/FLU/RSV plus assay is intended as an aid in the diagnosis of influenza from Nasopharyngeal swab specimens and should not be used as a sole basis for treatment. Nasal washings and aspirates are unacceptable for Xpert Xpress SARS-CoV-2/FLU/RSV testing.  Fact Sheet for  Patients: EntrepreneurPulse.com.au  Fact Sheet for Healthcare Providers: IncredibleEmployment.be  This test is not yet approved or cleared by the [place] FDA and has been authorized for detection and/or diagnosis of SARS-CoV-2 by FDA under an Emergency Use Authorization (EUA). This EUA will remain in effect (meaning this test can be used) for the duration of the COVID-19 declaration under Section 564(b)(1) of the Act, 21 U.S.C. section 360bbb-3(b)(1), unless the authorization is terminated or revoked.  Performed at [facility], [address] [place], [place] [address]      Time coordinating discharge: Over 30 minutes  SIGNED:   Little Ishikawa, DO [facility] 10/07/2020, 11:16 AM Pager   If 7PM-7AM, please contact night-coverage [email or web address]

## 2020-10-07 NOTE — Evaluation (Signed)
Physical Therapy Evaluation Patient Details Name: [name] MRN: [id number] DOB: [birth year] Today's Date: 10/07/2020   History of Present Illness  Pt is an 87 y/o male admitted secondary to hypertensive urgency and fall off scooter. Pt reporting increased back pain. Found to have small (36mm) infrarenal abdominal aortic aneurysm; bilateral common iliac artery aneurysms (~61mm); bilateral iliac artery aneurysms (~79mm). PMH includes DM and CKD.  Clinical Impression  Pt admitted secondary to problem above with deficits below. Pt requiring min to mod A to stand and min guard A to ambulate with RW. Per pt and wife, this is baseline for pt. Complaining of wooziness, but reports this happens at home. Has all necessary DME at home and pt's wife reports she can assist as needed. Pt reports he has been going to outpatient, but had stopped until he could get injection for sciatic nerve issues. Recommend resuming outpatient PT when able. Pt and pt's wife eager to d/c home. Will continue to follow acutely.     Follow Up Recommendations Outpatient PT;Supervision for mobility/OOB (when cleared for participation)    Equipment Recommendations  None recommended by PT    Recommendations for Other Services       Precautions / Restrictions Precautions Precautions: Fall Restrictions Weight Bearing Restrictions: No      Mobility  Bed Mobility               General bed mobility comments: In chair upon entry    Transfers Overall transfer level: Needs assistance Equipment used: Rolling walker (2 wheeled) Transfers: Sit to/from Stand Sit to Stand: Mod assist;Min assist         General transfer comment: Pt initially requiring mod A for transfers, but on second stand requiring min A. [name] reports this is baseline.  Ambulation/Gait Ambulation/Gait assistance: Min guard Gait Distance (Feet): 10 Feet Assistive device: Rolling walker (2 wheeled) Gait Pattern/deviations: Step-through  pattern;Decreased stride length Gait velocity: Decreased   General Gait Details: Min guard for safety. Ambulating short distance within room. Reporting some wooziness, so further mobility deferred. BP WFL. [name] reports wooziness is normal for pt.  Stairs            Wheelchair Mobility    Modified Rankin (Stroke Patients Only)       Balance Overall balance assessment: Needs assistance Sitting-balance support: No upper extremity supported;Feet supported Sitting balance-Leahy Scale: Fair     Standing balance support: Bilateral upper extremity supported;During functional activity Standing balance-Leahy Scale: Poor Standing balance comment: Reliant on BUE support                             Pertinent Vitals/Pain Pain Assessment: Faces Faces Pain Scale: Hurts a little bit Pain Location: back Pain Descriptors / Indicators: Grimacing;Guarding Pain Intervention(s): Limited activity within patient's tolerance;Monitored during session;Repositioned    Home Living Family/patient expects to be discharged to:: Private residence Living Arrangements: Spouse/significant other Available Help at Discharge: Family;Available 24 hours/day Type of Home: House Home Access: Ramped entrance     Home Layout: One level Home Equipment: Walker - 2 wheels;Cane - single point;Electric scooter;Transport chair;Bedside commode;Wheelchair - manual;Tub bench      Prior Function Level of Independence: Needs assistance   Gait / Transfers Assistance Needed: [name] reports she sometimes helps pt stand. Has a lift chair at home as well. Ambulates around the house with RW. Uses electric scooter vs transport chair for mobility outside of home.  ADL's /  Homemaking Assistance Needed: Wife assists with ADL tasks.        Hand Dominance        Extremity/Trunk Assessment   Upper Extremity Assessment Upper Extremity Assessment: Generalized weakness    Lower Extremity  Assessment Lower Extremity Assessment: Generalized weakness    Cervical / Trunk Assessment Cervical / Trunk Assessment: Kyphotic  Communication   Communication: HOH  Cognition Arousal/Alertness: Awake/alert Behavior During Therapy: WFL for tasks assessed/performed Overall Cognitive Status: Within Functional Limits for tasks assessed                                        General Comments General comments (skin integrity, edema, etc.): Educated about using WC for increased safety if still feeling very woozy.    Exercises     Assessment/Plan    PT Assessment Patient needs continued PT services  PT Problem List Decreased strength;Decreased balance;Decreased activity tolerance;Decreased mobility;Decreased knowledge of use of DME;Decreased knowledge of precautions       PT Treatment Interventions DME instruction;Gait training;Functional mobility training;Therapeutic activities;Therapeutic exercise;Balance training;Patient/family education    PT Goals (Current goals can be found in the Care Plan section)  Acute Rehab PT Goals Patient Stated Goal: to go home PT Goal Formulation: With patient/family Time For Goal Achievement: 10/21/20 Potential to Achieve Goals: Good    Frequency Min 3X/week   Barriers to discharge        Co-evaluation               AM-PAC PT "6 Clicks" Mobility  Outcome Measure Help needed turning from your back to your side while in a flat bed without using bedrails?: A Little Help needed moving from lying on your back to sitting on the side of a flat bed without using bedrails?: A Little Help needed moving to and from a bed to a chair (including a wheelchair)?: A Lot Help needed standing up from a chair using your arms (e.g., wheelchair or bedside chair)?: A Little Help needed to walk in hospital room?: A Little Help needed climbing 3-5 steps with a railing? : A Lot 6 Click Score: 16    End of Session Equipment Utilized During  Treatment: Gait belt Activity Tolerance: Patient limited by fatigue;Treatment limited secondary to medical complications (Comment) (wooziness) Patient left: in chair;with call bell/phone within reach;with chair alarm set;with family/visitor present Nurse Communication: Mobility status PT Visit Diagnosis: Unsteadiness on feet (R26.81);Muscle weakness (generalized) (M62.81)    Time: 5859-2924 PT Time Calculation (min) (ACUTE ONLY): 16 min   Charges:   PT Evaluation $PT Eval Moderate Complexity: 1 Mod          Reuel Derby, PT, DPT  Acute Rehabilitation Services  Pager: [phone] Office: [phone]   Rudean Hitt 10/07/2020, 2:33 PM

## 2020-10-07 NOTE — Telephone Encounter (Signed)
Per Nolene Bernheim:     Hey just following up Dr. Jannifer Franklin read [name] EEG yesterday it was completely normal and called the patient at 4 yesterday and he gave him the results.

## 2020-10-08 ENCOUNTER — Inpatient Hospital Stay (HOSPITAL_COMMUNITY): Payer: Medicare Other

## 2020-10-08 ENCOUNTER — Other Ambulatory Visit: Payer: Self-pay

## 2020-10-08 ENCOUNTER — Inpatient Hospital Stay (HOSPITAL_COMMUNITY)
Admission: EM | Admit: 2020-10-08 | Discharge: 2020-10-12 | DRG: 312 | Disposition: A | Discharge status: Home Health | Payer: Medicare Other | Attending: Internal Medicine | Admitting: Internal Medicine

## 2020-10-08 ENCOUNTER — Encounter (HOSPITAL_COMMUNITY): Payer: Self-pay

## 2020-10-08 ENCOUNTER — Emergency Department (HOSPITAL_COMMUNITY): Payer: Medicare Other

## 2020-10-08 ENCOUNTER — Telehealth: Payer: Self-pay

## 2020-10-08 DIAGNOSIS — N1832 Chronic kidney disease, stage 3b: Secondary | ICD-10-CM | POA: Diagnosis present

## 2020-10-08 DIAGNOSIS — I6523 Occlusion and stenosis of bilateral carotid arteries: Secondary | ICD-10-CM | POA: Diagnosis present

## 2020-10-08 DIAGNOSIS — I2510 Atherosclerotic heart disease of native coronary artery without angina pectoris: Secondary | ICD-10-CM | POA: Diagnosis present

## 2020-10-08 DIAGNOSIS — N179 Acute kidney failure, unspecified: Secondary | ICD-10-CM | POA: Diagnosis present

## 2020-10-08 DIAGNOSIS — Z8673 Personal history of transient ischemic attack (TIA), and cerebral infarction without residual deficits: Secondary | ICD-10-CM

## 2020-10-08 DIAGNOSIS — Z9181 History of falling: Secondary | ICD-10-CM | POA: Diagnosis not present

## 2020-10-08 DIAGNOSIS — G8929 Other chronic pain: Secondary | ICD-10-CM | POA: Diagnosis present

## 2020-10-08 DIAGNOSIS — I25110 Atherosclerotic heart disease of native coronary artery with unstable angina pectoris: Secondary | ICD-10-CM

## 2020-10-08 DIAGNOSIS — R55 Syncope and collapse: Secondary | ICD-10-CM

## 2020-10-08 DIAGNOSIS — Z79890 Hormone replacement therapy (postmenopausal): Secondary | ICD-10-CM

## 2020-10-08 DIAGNOSIS — E119 Type 2 diabetes mellitus without complications: Secondary | ICD-10-CM

## 2020-10-08 DIAGNOSIS — K219 Gastro-esophageal reflux disease without esophagitis: Secondary | ICD-10-CM | POA: Diagnosis present

## 2020-10-08 DIAGNOSIS — M5030 Other cervical disc degeneration, unspecified cervical region: Secondary | ICD-10-CM | POA: Diagnosis present

## 2020-10-08 DIAGNOSIS — E538 Deficiency of other specified B group vitamins: Secondary | ICD-10-CM | POA: Diagnosis present

## 2020-10-08 DIAGNOSIS — Y92010 Kitchen of single-family (private) house as the place of occurrence of the external cause: Secondary | ICD-10-CM | POA: Diagnosis not present

## 2020-10-08 DIAGNOSIS — M5136 Other intervertebral disc degeneration, lumbar region: Secondary | ICD-10-CM | POA: Diagnosis present

## 2020-10-08 DIAGNOSIS — Z66 Do not resuscitate: Secondary | ICD-10-CM | POA: Diagnosis present

## 2020-10-08 DIAGNOSIS — I10 Essential (primary) hypertension: Secondary | ICD-10-CM | POA: Diagnosis not present

## 2020-10-08 DIAGNOSIS — Z9049 Acquired absence of other specified parts of digestive tract: Secondary | ICD-10-CM

## 2020-10-08 DIAGNOSIS — F0150 Vascular dementia without behavioral disturbance: Secondary | ICD-10-CM | POA: Diagnosis present

## 2020-10-08 DIAGNOSIS — Z7952 Long term (current) use of systemic steroids: Secondary | ICD-10-CM

## 2020-10-08 DIAGNOSIS — E559 Vitamin D deficiency, unspecified: Secondary | ICD-10-CM | POA: Diagnosis present

## 2020-10-08 DIAGNOSIS — G909 Disorder of the autonomic nervous system, unspecified: Secondary | ICD-10-CM | POA: Diagnosis present

## 2020-10-08 DIAGNOSIS — E039 Hypothyroidism, unspecified: Secondary | ICD-10-CM | POA: Diagnosis present

## 2020-10-08 DIAGNOSIS — E1151 Type 2 diabetes mellitus with diabetic peripheral angiopathy without gangrene: Secondary | ICD-10-CM | POA: Diagnosis present

## 2020-10-08 DIAGNOSIS — Z7982 Long term (current) use of aspirin: Secondary | ICD-10-CM

## 2020-10-08 DIAGNOSIS — K589 Irritable bowel syndrome without diarrhea: Secondary | ICD-10-CM | POA: Diagnosis present

## 2020-10-08 DIAGNOSIS — S20222A Contusion of left back wall of thorax, initial encounter: Secondary | ICD-10-CM | POA: Diagnosis present

## 2020-10-08 DIAGNOSIS — Z9109 Other allergy status, other than to drugs and biological substances: Secondary | ICD-10-CM

## 2020-10-08 DIAGNOSIS — I160 Hypertensive urgency: Secondary | ICD-10-CM | POA: Diagnosis present

## 2020-10-08 DIAGNOSIS — E782 Mixed hyperlipidemia: Secondary | ICD-10-CM | POA: Diagnosis present

## 2020-10-08 DIAGNOSIS — Z881 Allergy status to other antibiotic agents status: Secondary | ICD-10-CM

## 2020-10-08 DIAGNOSIS — Z79899 Other long term (current) drug therapy: Secondary | ICD-10-CM

## 2020-10-08 DIAGNOSIS — M5134 Other intervertebral disc degeneration, thoracic region: Secondary | ICD-10-CM | POA: Diagnosis present

## 2020-10-08 DIAGNOSIS — R413 Other amnesia: Secondary | ICD-10-CM | POA: Diagnosis present

## 2020-10-08 DIAGNOSIS — Z20822 Contact with and (suspected) exposure to covid-19: Secondary | ICD-10-CM | POA: Diagnosis present

## 2020-10-08 DIAGNOSIS — Z8711 Personal history of peptic ulcer disease: Secondary | ICD-10-CM

## 2020-10-08 DIAGNOSIS — Z87891 Personal history of nicotine dependence: Secondary | ICD-10-CM

## 2020-10-08 DIAGNOSIS — W1839XA Other fall on same level, initial encounter: Secondary | ICD-10-CM | POA: Diagnosis present

## 2020-10-08 DIAGNOSIS — E1122 Type 2 diabetes mellitus with diabetic chronic kidney disease: Secondary | ICD-10-CM | POA: Diagnosis present

## 2020-10-08 DIAGNOSIS — R0989 Other specified symptoms and signs involving the circulatory and respiratory systems: Secondary | ICD-10-CM

## 2020-10-08 DIAGNOSIS — D631 Anemia in chronic kidney disease: Secondary | ICD-10-CM | POA: Diagnosis present

## 2020-10-08 DIAGNOSIS — Z955 Presence of coronary angioplasty implant and graft: Secondary | ICD-10-CM

## 2020-10-08 DIAGNOSIS — E785 Hyperlipidemia, unspecified: Secondary | ICD-10-CM | POA: Diagnosis present

## 2020-10-08 DIAGNOSIS — M7061 Trochanteric bursitis, right hip: Secondary | ICD-10-CM

## 2020-10-08 DIAGNOSIS — E1169 Type 2 diabetes mellitus with other specified complication: Secondary | ICD-10-CM | POA: Diagnosis present

## 2020-10-08 DIAGNOSIS — Z8719 Personal history of other diseases of the digestive system: Secondary | ICD-10-CM

## 2020-10-08 DIAGNOSIS — Z833 Family history of diabetes mellitus: Secondary | ICD-10-CM

## 2020-10-08 DIAGNOSIS — I714 Abdominal aortic aneurysm, without rupture, unspecified: Secondary | ICD-10-CM | POA: Diagnosis present

## 2020-10-08 DIAGNOSIS — Z791 Long term (current) use of non-steroidal anti-inflammatories (NSAID): Secondary | ICD-10-CM

## 2020-10-08 DIAGNOSIS — I951 Orthostatic hypotension: Secondary | ICD-10-CM | POA: Diagnosis present

## 2020-10-08 DIAGNOSIS — Z8261 Family history of arthritis: Secondary | ICD-10-CM

## 2020-10-08 DIAGNOSIS — I129 Hypertensive chronic kidney disease with stage 1 through stage 4 chronic kidney disease, or unspecified chronic kidney disease: Secondary | ICD-10-CM | POA: Diagnosis present

## 2020-10-08 DIAGNOSIS — N400 Enlarged prostate without lower urinary tract symptoms: Secondary | ICD-10-CM | POA: Diagnosis present

## 2020-10-08 DIAGNOSIS — Z800 Family history of malignant neoplasm of digestive organs: Secondary | ICD-10-CM

## 2020-10-08 DIAGNOSIS — Z8249 Family history of ischemic heart disease and other diseases of the circulatory system: Secondary | ICD-10-CM

## 2020-10-08 DIAGNOSIS — Z951 Presence of aortocoronary bypass graft: Secondary | ICD-10-CM

## 2020-10-08 DIAGNOSIS — Z888 Allergy status to other drugs, medicaments and biological substances status: Secondary | ICD-10-CM

## 2020-10-08 LAB — URINALYSIS, ROUTINE W REFLEX MICROSCOPIC
Bacteria, UA: NONE SEEN
Bilirubin Urine: NEGATIVE
Glucose, UA: NEGATIVE mg/dL
Ketones, ur: NEGATIVE mg/dL
Leukocytes,Ua: NEGATIVE
Nitrite: NEGATIVE
Protein, ur: NEGATIVE mg/dL
Specific Gravity, Urine: 1.011 (ref 1.005–1.030)
pH: 6 (ref 5.0–8.0)

## 2020-10-08 LAB — CBC
HCT: 35.6 % — ABNORMAL LOW (ref 39.0–52.0)
Hemoglobin: 10.9 g/dL — ABNORMAL LOW (ref 13.0–17.0)
MCH: 31.5 pg (ref 26.0–34.0)
MCHC: 30.6 g/dL (ref 30.0–36.0)
MCV: 102.9 fL — ABNORMAL HIGH (ref 80.0–100.0)
Platelets: 150 10*3/uL (ref 150–400)
RBC: 3.46 MIL/uL — ABNORMAL LOW (ref 4.22–5.81)
RDW: 13.9 % (ref 11.5–15.5)
WBC: 7 10*3/uL (ref 4.0–10.5)
nRBC: 0 % (ref 0.0–0.2)

## 2020-10-08 LAB — SARS CORONAVIRUS 2 (TAT 6-24 HRS): SARS Coronavirus 2: NEGATIVE

## 2020-10-08 LAB — BRAIN NATRIURETIC PEPTIDE: B Natriuretic Peptide: 53.8 pg/mL (ref 0.0–100.0)

## 2020-10-08 LAB — COMPREHENSIVE METABOLIC PANEL
ALT: 11 U/L (ref 0–44)
AST: 21 U/L (ref 15–41)
Albumin: 3.2 g/dL — ABNORMAL LOW (ref 3.5–5.0)
Alkaline Phosphatase: 90 U/L (ref 38–126)
Anion gap: 7 (ref 5–15)
BUN: 41 mg/dL — ABNORMAL HIGH (ref 8–23)
CO2: 26 mmol/L (ref 22–32)
Calcium: 8.6 mg/dL — ABNORMAL LOW (ref 8.9–10.3)
Chloride: 105 mmol/L (ref 98–111)
Creatinine, Ser: 2.61 mg/dL — ABNORMAL HIGH (ref 0.61–1.24)
GFR, Estimated: 23 mL/min — ABNORMAL LOW (ref >60)
Glucose, Bld: 117 mg/dL — ABNORMAL HIGH (ref 70–99)
Potassium: 4.4 mmol/L (ref 3.5–5.1)
Sodium: 138 mmol/L (ref 135–145)
Total Bilirubin: 1 mg/dL (ref 0.3–1.2)
Total Protein: 5.9 g/dL — ABNORMAL LOW (ref 6.5–8.1)

## 2020-10-08 LAB — VITAMIN B12: Vitamin B-12: 1904 pg/mL — ABNORMAL HIGH (ref 180–914)

## 2020-10-08 LAB — TSH: TSH: 3.374 u[IU]/mL (ref 0.350–4.500)

## 2020-10-08 LAB — VITAMIN D 25 HYDROXY (VIT D DEFICIENCY, FRACTURES): Vit D, 25-Hydroxy: 117.59 ng/mL — ABNORMAL HIGH (ref 30–100)

## 2020-10-08 LAB — TROPONIN I (HIGH SENSITIVITY)
Troponin I (High Sensitivity): 15 ng/L (ref <18)
Troponin I (High Sensitivity): 17 ng/L (ref <18)

## 2020-10-08 MED ORDER — ENOXAPARIN SODIUM 30 MG/0.3ML IJ SOSY
30.0000 mg | PREFILLED_SYRINGE | INTRAMUSCULAR | Status: DC
  Administered 2020-10-08 – 2020-10-12 (×5): 30 mg via SUBCUTANEOUS
  Filled 2020-10-08 (×5): qty 0.3

## 2020-10-08 MED ORDER — SODIUM CHLORIDE 0.9 % IV SOLN: INTRAVENOUS | Status: DC

## 2020-10-08 MED ORDER — VITAMIN B-12 1000 MCG PO TABS
3000.0000 ug | ORAL_TABLET | Freq: Every day | ORAL | Status: DC
  Administered 2020-10-08: 3000 ug via ORAL
  Filled 2020-10-08: qty 3

## 2020-10-08 MED ORDER — FERROUS SULFATE 325 (65 FE) MG PO TABS
325.0000 mg | ORAL_TABLET | Freq: Every day | ORAL | Status: DC
  Administered 2020-10-09 – 2020-10-12 (×2): 325 mg via ORAL
  Filled 2020-10-08 (×3): qty 1

## 2020-10-08 MED ORDER — SENNOSIDES-DOCUSATE SODIUM 8.6-50 MG PO TABS: 1.0000 | ORAL_TABLET | Freq: Every evening | ORAL | Status: DC | PRN

## 2020-10-08 MED ORDER — POLYETHYLENE GLYCOL 3350 17 G PO PACK
17.0000 g | PACK | Freq: Every day | ORAL | Status: DC
  Administered 2020-10-09 – 2020-10-12 (×4): 17 g via ORAL
  Filled 2020-10-08 (×4): qty 1

## 2020-10-08 MED ORDER — PRAVASTATIN SODIUM 40 MG PO TABS
40.0000 mg | ORAL_TABLET | Freq: Every day | ORAL | Status: DC
  Administered 2020-10-08 – 2020-10-12 (×5): 40 mg via ORAL
  Filled 2020-10-08 (×5): qty 1

## 2020-10-08 MED ORDER — BUTALBITAL-APAP-CAFFEINE 50-325-40 MG PO TABS: 1.0000 | ORAL_TABLET | ORAL | Status: DC | PRN

## 2020-10-08 MED ORDER — ACETAMINOPHEN 650 MG RE SUPP: 650.0000 mg | Freq: Four times a day (QID) | RECTAL | Status: DC | PRN

## 2020-10-08 MED ORDER — PANTOPRAZOLE SODIUM 40 MG PO TBEC
40.0000 mg | DELAYED_RELEASE_TABLET | Freq: Every day | ORAL | Status: DC
  Administered 2020-10-08 – 2020-10-12 (×5): 40 mg via ORAL
  Filled 2020-10-08 (×5): qty 1

## 2020-10-08 MED ORDER — HYDRALAZINE HCL 25 MG PO TABS
25.0000 mg | ORAL_TABLET | Freq: Three times a day (TID) | ORAL | Status: DC
  Administered 2020-10-08 – 2020-10-09 (×2): 25 mg via ORAL
  Filled 2020-10-08 (×2): qty 1

## 2020-10-08 MED ORDER — LEVOTHYROXINE SODIUM 25 MCG PO TABS
25.0000 ug | ORAL_TABLET | Freq: Every day | ORAL | Status: DC
  Administered 2020-10-09 – 2020-10-12 (×4): 25 ug via ORAL
  Filled 2020-10-08 (×5): qty 1

## 2020-10-08 MED ORDER — ADULT MULTIVITAMIN W/MINERALS CH
1.0000 | ORAL_TABLET | Freq: Every day | ORAL | Status: DC
  Administered 2020-10-08 – 2020-10-12 (×5): 1 via ORAL
  Filled 2020-10-08 (×5): qty 1

## 2020-10-08 MED ORDER — AMLODIPINE BESYLATE 5 MG PO TABS
5.0000 mg | ORAL_TABLET | Freq: Every day | ORAL | Status: DC
  Administered 2020-10-09: 5 mg via ORAL
  Filled 2020-10-08: qty 1

## 2020-10-08 MED ORDER — ASPIRIN EC 81 MG PO TBEC
162.0000 mg | DELAYED_RELEASE_TABLET | Freq: Every day | ORAL | Status: DC
  Administered 2020-10-08 – 2020-10-10 (×3): 162 mg via ORAL
  Filled 2020-10-08 (×3): qty 2

## 2020-10-08 MED ORDER — HYDROCODONE-ACETAMINOPHEN 5-325 MG PO TABS
1.0000 | ORAL_TABLET | ORAL | Status: DC | PRN
  Administered 2020-10-09 – 2020-10-11 (×7): 1 via ORAL
  Administered 2020-10-12: 2 via ORAL
  Filled 2020-10-08: qty 1
  Filled 2020-10-08: qty 2
  Filled 2020-10-08 (×6): qty 1

## 2020-10-08 MED ORDER — LEVOTHYROXINE SODIUM 25 MCG PO TABS: 25.0000 ug | ORAL_TABLET | Freq: Every day | ORAL | Status: DC

## 2020-10-08 MED ORDER — ACETAMINOPHEN 325 MG PO TABS
650.0000 mg | ORAL_TABLET | Freq: Four times a day (QID) | ORAL | Status: DC | PRN
  Administered 2020-10-08 – 2020-10-11 (×6): 650 mg via ORAL
  Filled 2020-10-08 (×6): qty 2

## 2020-10-08 MED ORDER — AMLODIPINE BESYLATE 10 MG PO TABS: 10.0000 mg | ORAL_TABLET | Freq: Every day | ORAL | Status: DC

## 2020-10-08 MED ORDER — BUPROPION HCL ER (XL) 150 MG PO TB24
150.0000 mg | ORAL_TABLET | Freq: Every day | ORAL | Status: DC
  Administered 2020-10-08 – 2020-10-12 (×5): 150 mg via ORAL
  Filled 2020-10-08 (×6): qty 1

## 2020-10-08 MED ORDER — MEMANTINE HCL 10 MG PO TABS
10.0000 mg | ORAL_TABLET | Freq: Two times a day (BID) | ORAL | Status: DC
  Administered 2020-10-08 – 2020-10-12 (×9): 10 mg via ORAL
  Filled 2020-10-08 (×11): qty 1

## 2020-10-08 MED ORDER — SODIUM CHLORIDE 0.9 % IV BOLUS
500.0000 mL | Freq: Once | INTRAVENOUS | Status: AC
  Administered 2020-10-08: 500 mL via INTRAVENOUS

## 2020-10-08 MED ORDER — TAMSULOSIN HCL 0.4 MG PO CAPS
0.4000 mg | ORAL_CAPSULE | Freq: Every day | ORAL | Status: DC
  Administered 2020-10-08 – 2020-10-10 (×3): 0.4 mg via ORAL
  Filled 2020-10-08 (×3): qty 1

## 2020-10-08 MED ORDER — VITAMIN B-12 3000 MCG SL SUBL: 1.0000 | SUBLINGUAL_TABLET | Freq: Every day | SUBLINGUAL | Status: DC

## 2020-10-08 NOTE — Progress Notes (Signed)
Carotid duplex has been completed.   Preliminary results in CV Proc.   Abram Sander 10/08/2020 2:54 PM

## 2020-10-08 NOTE — H&P (Signed)
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 endplate irregularity and chronic appearing degenerative marrow changes. No marrow edema. No suspicious osseous lesion. Conus medullaris and cauda equina: Conus extends to the L1-L2 level. Conus and cauda equina appear normal. No abnormal intrathecal enhancement. Paraspinal and other soft tissues: Aneurysmal dilatation of the abdominal aorta and common iliacs. The aorta measures up to 4.4 cm above the bifurcation. These findings are discussed on recent CT imaging with follow-up recommendation provided. Disc levels: L1-L2: Small right foraminal protrusion. No canal or foraminal stenosis. Appearance is similar. L2-L3: Disc bulge slightly eccentric to the right. No canal or foraminal stenosis. Appearance is similar. L3-L4: Disc bulge with endplate osteophytic ridging eccentric to the left. Punctate central annular fissure. No canal stenosis. Slight effacement of the  left subarticular recess. Minor right and  moderate left foraminal stenosis. Appearance is similar. L4-L5: Disc bulge with endplate osteophytic ridging eccentric to the left. Facet arthropathy with ligamentum flavum infolding. Slightly increased mild canal stenosis. Partial effacement of the subarticular recesses. Moderate right and moderate to marked left foraminal stenosis. Appearance is similar. L5-S1: Disc bulge with endplate osteophytic ridging slightly eccentric to the right. Facet arthropathy. No canal stenosis. Slight effacement of the subarticular recesses. Moderate right and mild left foraminal stenosis. Appearance is similar. IMPRESSION: No new compression deformity. Multilevel degenerative changes as detailed above are overall similar in appearance to the prior study other than slightly increased mild canal stenosis at L4-L5. Electronically Signed   By: Macy Mis M.D.   On: 10/06/2020 15:58    EKG: Independently reviewed.  NSR with rate 79 and RBBB; nonspecific ST changes with no evidence of acute ischemia   Labs on Admission: I have personally reviewed the available labs and imaging studies at the time of the admission.  Pertinent labs:  -creatinine: 2.61 baseline: 1.7-2.0 -H&H: 10.9/35.6    Assessment/Plan  Syncope and collapse -history of dysautomonia orthostatic hypotension syndrome -fludrocortisone was held at last admit due to elevated blood pressure readings and his HTN medication adjusted. Likely cause of recent syncope.  -putting on tele, ordering echo and will do carotid ultrasound secondary to syncope and bruit on exam.  -cardiology consulted and appreciate input.     HTN (hypertension) -Holding nephrotoxic drugs in setting of AKI. -Held his Benicar and Mobic  -Continue amlodipine and hydralazine -cardiology consulted as well with change in medication and in setting of syncope.     Acute renal failure superimposed on stage 3b chronic kidney disease (HCC) -Creatinine on admit 2.61 baseline around  1.7-2.0 -Likely prerenal secondary to hypoperfusion from orthostasis and hypotension -Very light IV fluid hydration received 1 L bolus in ER -Repeat renal labs in a.m. -Held Mobic unsure why on NSAID with chronic kidney disease    Coronary atherosclerosis- s/p PCI to LAD in 2009 and PCI to RCA in 2011 -Stable continue medical management     Abdominal aortic aneurysm  -Recent MRI of lumbar spine showed stable AAA at 4.5 cm on recent admit 10/06/20.  -Vascular surgery recommended no further evaluation or intervention and to follow-up outpatient    GERD -continue protonix     Vitamin B 12 deficiency -on oral replacement. B12 >1000.     Hyperlipidemia associated with type 2 diabetes mellitus ([place]) -continue statin  -Lipid panel recently checked in November 2021 and to goal    Diabetes mellitus type 2, diet-controlled ([place]) -recent a1c of 5.6, diet controlled  -notify for sliding scale if sugar >140    Memory loss -continue namenda -fall precautions     Hypothyroid  -TSH wnl -continue home synthroid dosage.     Body mass index is 25.09 kg/m.   DVT prophylaxis:  Lovenox  Code Status: DNR confirmed with patient and wife  Family Communication: wife is at bedside and is main historian: Chales Pelissier.  Disposition Plan:  The patient is from: home  Anticipated d/c is to: home without [facility] services once his cardiology issues have been resolved as well as AKI.     Consults called: cardiology   Admission status:  inpatient    Orma Flaming MD [facility]   How to contact the [facility] Attending or Consulting provider [place] or covering provider during after hours [place], for this patient?  Check the care team  in [facility] and look for a) attending/consulting [facility] provider listed and b) the [facility] team listed Log into [email or web address] and use [place]'s universal password to access. If you do not have the password, please contact the hospital operator. Locate the [facility] provider you are  looking for under [facility] and page to a number that you can be directly reached. If you still have difficulty reaching the provider, please page the [facility] (Director on Call) for the Hospitalists listed on amion for assistance.   10/08/2020, 1:50 PM

## 2020-10-08 NOTE — ED Triage Notes (Signed)
Pt arrive to ED via EMS from home w/ c/o unwitnessed syncopal episode and fall. Pt was D/C'd yesterday for HTN crisis and was told to take an extra amlodipine for his BP. Pt took an extra amlodipine last night as was told to by MD. [name] found pt in floor unconscious and called EMS. Pt arrived in C-collar. Pt is A&Ox2 (self, place) which is baseline. Initial BP 90/50 and EMS reports pt was diaphoretic. HR 60, CBG 171, 97% RA. EMS placed 20g L AC and gave 59mL NS bolus. Last BP w/ EMS was 120/60. Pt c/o neck and lower back pain.

## 2020-10-08 NOTE — Consult Note (Addendum)
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 a prior small infarct in the medial inferior right cerebellum. Diffuse decreased attenuation in the centra semiovale is also present, a stable finding likely due to periventricular small vessel disease. A degree of small vessel disease is also noted in the midbrain in the basilar perforator distribution. No acute infarct evident. No mass or hemorrhage. There are multiple foci of arterial vascular calcification. Ethmoid paranasal sinus disease noted. Electronically Signed   By: Lowella Grip III M.D.   On: 10/08/2020 11:21   CT Chest Wo Contrast  Result Date: 10/08/2020 CLINICAL DATA:  Patient fell last night. EXAM: CT CHEST, ABDOMEN AND PELVIS WITHOUT CONTRAST TECHNIQUE: Multidetector CT imaging of the chest, abdomen and pelvis was performed following the standard  protocol without IV contrast. COMPARISON:  CTA chest abdomen pelvis 10/05/2020 FINDINGS: CT CHEST FINDINGS Cardiovascular: The heart size is normal. No substantial pericardial effusion. Coronary artery calcification is evident. Status post CABG. Atherosclerotic calcification is noted in the wall of the thoracic aorta. Mediastinum/Nodes: No mediastinal lymphadenopathy. No evidence for gross hilar lymphadenopathy although assessment is limited by the lack of intravenous contrast on today's study. The esophagus has normal imaging features. There is no axillary lymphadenopathy. Lungs/Pleura: Centrilobular and paraseptal emphysema evident. No pneumothorax. Stable 6 mm right upper lobe nodule on 84/5. No new suspicious pulmonary nodule or mass. No focal airspace consolidation. No pleural effusion. Musculoskeletal: No worrisome lytic or sclerotic osseous abnormality. CT ABDOMEN PELVIS FINDINGS Hepatobiliary: No focal abnormality in the liver on this study without intravenous contrast. Layering sludge or tiny stones noted in the gallbladder. No intrahepatic or extrahepatic biliary dilation. Pancreas: No focal mass lesion. No dilatation of the main duct. No intraparenchymal cyst. No peripancreatic edema. Spleen: No splenomegaly. No focal mass lesion. Adrenals/Urinary Tract: No adrenal nodule or mass. Kidneys unremarkable. No evidence for hydroureter. The urinary bladder appears normal for the degree of distention. Stomach/Bowel: Stomach is nondistended. Duodenum is normally positioned as is the ligament of Treitz. No small bowel wall thickening. No small bowel dilatation. The terminal ileum is normal. The appendix is not well visualized, but there is no edema or inflammation in the region of the cecum. No gross colonic mass. No colonic wall thickening. Vascular/Lymphatic: Infrarenal abdominal aortic aneurysm measures up to 4.3 cm. Right common iliac artery is 2.5 cm diameter and left common iliac artery is also 2.5 cm. There  is no gastrohepatic or hepatoduodenal ligament lymphadenopathy. No retroperitoneal or mesenteric lymphadenopathy. No pelvic sidewall lymphadenopathy. Reproductive: The prostate gland and seminal vesicles are unremarkable. Other: No intraperitoneal free fluid. Musculoskeletal: No worrisome lytic or sclerotic osseous abnormality. IMPRESSION: 1. No evidence for acute traumatic injury in the chest, abdomen, or pelvis. Sensitivity for evaluating solid abdominal viscera in the setting of trauma is decreased without intravenous contrast material. Within this limitation, there is no evidence for perihepatic or perisplenic fluid. No free fluid in the peritoneal cavity. 2. 4.3 cm infrarenal abdominal aortic aneurysm with bilateral common iliac artery aneurysms measuring 2.5 cm on the right and 2.5 cm on the left. These are better characterized on CTA exam 2 days ago. Please see that report for follow-up recommendations. 3. Stable 6 mm right upper lobe pulmonary nodule. Non-contrast chest CT at 6-12 months is recommended. If the nodule is stable at time of repeat CT, then future CT at 18-24 months (from today's scan) is considered optional for low-risk patients, but is  recommended for high-risk patients. This recommendation follows the consensus statement: Guidelines for Management of Incidental Pulmonary Nodules Detected on CT Images: From the Fleischner Society 2017; Radiology 2017; 284:228-243. 4. Probable cholelithiasis. 5. Aortic Atherosclerosis (ICD10-I70.0) and Emphysema (ICD10-J43.9). Electronically Signed   By: Misty Stanley M.D.   On: 10/08/2020 10:39   CT Cervical Spine Wo Contrast  Result Date: 10/08/2020 CLINICAL DATA:  Neck injury after fall last night. EXAM: CT CERVICAL SPINE WITHOUT CONTRAST TECHNIQUE: Multidetector CT imaging of the cervical spine was performed without intravenous contrast. Multiplanar CT image reconstructions were also generated. COMPARISON:  None. FINDINGS: Alignment: Minimal grade 1  retrolisthesis is noted at C5-6 secondary to degenerative disc disease at this level. Skull base and vertebrae: No acute fracture. No primary bone lesion or focal pathologic process. Soft tissues and spinal canal: No prevertebral fluid or swelling. No visible canal hematoma. Disc levels: Moderate degenerative disc disease is noted at C5-6 and C6-7. Upper chest: Negative. Other: None. IMPRESSION: Moderate multilevel degenerative disc disease. No acute abnormality seen in the cervical spine. Electronically Signed   By: Marijo Conception M.D.   On: 10/08/2020 11:25   MR Lumbar Spine W Wo Contrast  Result Date: 10/06/2020 CLINICAL DATA:  Low back pain, fall from scooter recently EXAM: MRI LUMBAR SPINE WITHOUT AND WITH CONTRAST TECHNIQUE: Multiplanar and multiecho pulse sequences of the lumbar spine were obtained without and with intravenous contrast. CONTRAST:  54mL GADAVIST GADOBUTROL 1 MMOL/ML IV SOLN COMPARISON:  03/22/2020 FINDINGS: Segmentation:  Standard. Alignment: Dextrocurvature. Similar anteroposterior alignment with minor listhesis, for example retrolisthesis at L4-L5. Vertebrae: Similar vertebral body heights with degenerative endplate irregularity and chronic appearing degenerative marrow changes. No marrow edema. No suspicious osseous lesion. Conus medullaris and cauda equina: Conus extends to the L1-L2 level. Conus and cauda equina appear normal. No abnormal intrathecal enhancement. Paraspinal and other soft tissues: Aneurysmal dilatation of the abdominal aorta and common iliacs. The aorta measures up to 4.4 cm above the bifurcation. These findings are discussed on recent CT imaging with follow-up recommendation provided. Disc levels: L1-L2: Small right foraminal protrusion. No canal or foraminal stenosis. Appearance is similar. L2-L3: Disc bulge slightly eccentric to the right. No canal or foraminal stenosis. Appearance is similar. L3-L4: Disc bulge with endplate osteophytic ridging eccentric to the  left. Punctate central annular fissure. No canal stenosis. Slight effacement of the left subarticular recess. Minor right and moderate left foraminal stenosis. Appearance is similar. L4-L5: Disc bulge with endplate osteophytic ridging eccentric to the left. Facet arthropathy with ligamentum flavum infolding. Slightly increased mild canal stenosis. Partial effacement of the subarticular recesses. Moderate right and moderate to marked left foraminal stenosis. Appearance is similar. L5-S1: Disc bulge with endplate osteophytic ridging slightly eccentric to the right. Facet arthropathy. No canal stenosis. Slight effacement of the subarticular recesses. Moderate right and mild left foraminal stenosis. Appearance is similar. IMPRESSION: No new compression deformity. Multilevel degenerative changes as detailed above are overall similar in appearance to the prior study other than slightly increased mild canal stenosis at L4-L5. Electronically Signed   By: Macy Mis M.D.   On: 10/06/2020 15:58   CT Angio Chest/Abd/Pel for Dissection W and/or Wo Contrast  Result Date: 10/05/2020 CLINICAL DATA:  Chest and back pain. Fell from scooter a few days ago. Known AAA. EXAM: CT ANGIOGRAPHY CHEST, ABDOMEN AND PELVIS TECHNIQUE: Multidetector CT imaging through the chest, abdomen and pelvis was performed using the standard protocol during bolus administration of intravenous contrast. Multiplanar reconstructed images and MIPs were  obtained and reviewed to evaluate the vascular anatomy. CONTRAST:  84mL OMNIPAQUE IOHEXOL 350 MG/ML SOLN COMPARISON:  CT scan 05/17/2017 FINDINGS: CTA CHEST FINDINGS Cardiovascular: The heart is mildly enlarged. No pericardial effusion. There is moderate tortuosity and atherosclerotic disease involving the thoracic aorta but no focal aneurysm or dissection. Surgical changes from coronary artery bypass surgery with three-vessel coronary artery calcifications. The aortic branch vessels are patent. The  pulmonary arterial tree is well opacified. No findings for pulmonary embolism. Mediastinum/Nodes: No mediastinal or hilar mass or lymphadenopathy. The esophagus is grossly normal. Lungs/Pleura: Moderate emphysematous changes and pulmonary scarring. No infiltrates, edema or effusions. No pneumothorax. No worrisome pulmonary lesions. 6 mm perifissural nodule along the minor fissure contains a small calcification and is likely benign. Musculoskeletal: No chest wall mass or hematoma. No findings suspicious for sternal or rib fracture. The thoracic vertebral bodies are normally aligned. No acute fracture. Review of the MIP images confirms the above findings. CTA ABDOMEN AND PELVIS FINDINGS VASCULAR Aorta: Tortuosity and ectasia of the abdominal aorta with moderate scattered atherosclerotic calcifications. There is a infrarenal abdominal aortic aneurysm with maximum measurements of 4.5 x 4.1 cm. No dissection. No involvement of the bifurcation. Celiac: Patent.  Mild ostial calcifications. SMA: Patent.  Mild calcifications at the ostia. Renals: Patent. Mild ostial calcifications bilaterally. Mild proximal stenosis of both vessels. IMA: Patent Inflow: Fusiform aneurysmal dilatation both common iliac arteries with maximum diameter of 3 cm on the left and 28.5 mm on the right. Bilateral internal iliac artery aneurysms. The right measures 2.0 cm and the left 2.2 cm. Veins: Grossly normal. Review of the MIP images confirms the above findings. NON-VASCULAR Hepatobiliary: No hepatic lesions or acute hepatic injury. The gallbladder demonstrates small layering gallstones. No common bile duct dilatation. Pancreas: Moderate pancreatic atrophy but no mass or inflammation. Spleen: Normal size.  No acute injury. Adrenals/Urinary Tract: The adrenal glands and kidneys are unremarkable. No renal lesions or hydronephrosis. No perinephric hematoma. The bladder is unremarkable. Stomach/Bowel: The stomach, duodenum, small bowel and colon are  grossly normal. Lymphatic: No abdominal or pelvic lymphadenopathy. Reproductive: Mild prostate gland enlargement. The seminal vesicles are unremarkable. Other: No pelvic mass or adenopathy. No free pelvic fluid collections. No inguinal mass or adenopathy. No abdominal wall hernia or subcutaneous lesions. Musculoskeletal: Lumbar scoliosis and degenerative lumbar spondylosis but no acute bony findings. Right hip hardware noted. No complicating features. No pelvic fractures. Review of the MIP images confirms the above findings. IMPRESSION: 1. Moderate tortuosity and atherosclerotic disease involving the thoracic aorta but no focal aneurysm or dissection. 2. Surgical changes from coronary artery bypass surgery with three-vessel coronary artery calcifications. 3. Moderate emphysematous changes and pulmonary scarring. No acute pulmonary findings or worrisome pulmonary lesions. 4. 4.5 x 4.1 cm infrarenal abdominal aortic aneurysm Recommend follow-up CT/MR every 6 months and vascular consultation. This recommendation follows ACR consensus guidelines: White Paper of the ACR Incidental Findings Committee II on Vascular Findings. J Am Coll Radiol 2013; 10:789-794. 5. Bilateral common iliac artery aneurysms and bilateral internal iliac artery aneurysms. 6. No acute abdominal/pelvic findings, mass lesions or adenopathy. Aortic Atherosclerosis (ICD10-I70.0) and Emphysema (ICD10-J43.9). Electronically Signed   By: Marijo Sanes M.D.   On: 10/05/2020 14:33   VAS US CAROTID  Result Date: 10/08/2020 Carotid Arterial Duplex Study Patient Name:  [name]  Date of Exam:   10/08/2020 Medical Rec #: [id number]          Accession #:    [id number] Date of Birth: 08-Jun-1933  Patient Gender: M Patient Age:   47Y Exam Location:  [facility] Procedure:      VAS US CAROTID Referring Phys: [id number] Orma Flaming --------------------------------------------------------------------------------  Indications:       Right bruit  and Syncope. Risk Factors:      Hypertension, hyperlipidemia, Diabetes, coronary artery                    disease, prior CVA. Comparison Study:  10/21/19 prior Performing Technologist: Archie Patten RVS  Examination Guidelines: A complete evaluation includes B-mode imaging, spectral Doppler, color Doppler, and power Doppler as needed of all accessible portions of each vessel. Bilateral testing is considered an integral part of a complete examination. Limited examinations for reoccurring indications may be performed as noted.  Right Carotid Findings: +----------+--------+--------+--------+------------------+--------+           PSV cm/sEDV cm/sStenosisPlaque DescriptionComments +----------+--------+--------+--------+------------------+--------+ CCA Prox  50      12              heterogenous               +----------+--------+--------+--------+------------------+--------+ CCA Distal44      13              heterogenous               +----------+--------+--------+--------+------------------+--------+ ICA Prox  163     48      40-59%  heterogenous      tortuous +----------+--------+--------+--------+------------------+--------+ ICA Distal83      23                                         +----------+--------+--------+--------+------------------+--------+ ECA       291     16                                         +----------+--------+--------+--------+------------------+--------+ +----------+--------+-------+--------+-------------------+           PSV cm/sEDV cmsDescribeArm Pressure (mmHG) +----------+--------+-------+--------+-------------------+ [id number]                                         +----------+--------+-------+--------+-------------------+ +---------+--------+--+--------+-+---------+ VertebralPSV cm/s48EDV cm/s5Antegrade +---------+--------+--+--------+-+---------+  Left Carotid Findings:  +----------+--------+--------+--------+------------------+--------+           PSV cm/sEDV cm/sStenosisPlaque DescriptionComments +----------+--------+--------+--------+------------------+--------+ CCA Prox  112     26              heterogenous               +----------+--------+--------+--------+------------------+--------+ CCA Distal68      13              heterogenous               +----------+--------+--------+--------+------------------+--------+ ICA Prox  165     43      40-59%  heterogenous      tortuous +----------+--------+--------+--------+------------------+--------+ ICA Distal70      26                                         +----------+--------+--------+--------+------------------+--------+ ECA       140  22                                         +----------+--------+--------+--------+------------------+--------+ +----------+--------+--------+--------+-------------------+           PSV cm/sEDV cm/sDescribeArm Pressure (mmHG) +----------+--------+--------+--------+-------------------+ [id number]                                          +----------+--------+--------+--------+-------------------+ +---------+--------+--+--------+--+---------+ VertebralPSV cm/s50EDV cm/s16Antegrade +---------+--------+--+--------+--+---------+   Summary: Right Carotid: Velocities in the right ICA are consistent with a 40-59%                stenosis. Left Carotid: Velocities in the left ICA are consistent with a 40-59% stenosis. Vertebrals: Bilateral vertebral arteries demonstrate antegrade flow. *See table(s) above for measurements and observations.     Preliminary      Assessment and Plan:   Syncope and collapse Hx of postural hypotension Hypertension Recent medication changes - orthostatic hypotension in ER with SBP 161 lying --> 93 standing - add back florinef - stop hydralazine - monitor BP  - olmesartan used at home PRN - wife is very good  at managing his pressure at home - per Dr. Jacalyn Lefevre notes, pt was taking 5 mg amlodipine prior to last hospitalization - suspect his syncope was likely related to hypotension, but will check and echo as below   CAD s/p CABG - hs troponin x 2 negative - EKG does not appear ischemia - low suspicion for an ACS process - he does report chest pain with tenderness to palpation in her left upper chest near his loop recorder - he did not hit his chest during the syncopal episode - will obtain echo   Hx of CVA - ILR interrogated, he has had no new events since Aug 2021, per [employer] rep   AAA - AAA recently measured - 4.5 cm on CTA - bilateral internal iliac artery aneurysms R- 2.2, L- 2.0 cm   Carotid artery stenosis - duplex with stable bilateral disease    Risk Assessment/Risk Scores:      :[id number]}   HEAR Score (for undifferentiated chest pain):  HEAR Score: 4{        For questions or updates, please contact [place] Please consult [email or web address] for contact info under    Signed, Ledora Bottcher, PA  10/08/2020 3:30 PM

## 2020-10-08 NOTE — ED Notes (Signed)
Unable to obtain orthostatic VS at this time d/t C-collar in place and maintaining C-spine

## 2020-10-08 NOTE — ED Provider Notes (Signed)
[facility] EMERGENCY DEPARTMENT Provider Note   CSN: [id number] Arrival date & time: 10/08/20  0859     History Chief Complaint  Patient presents with   Loss of Consciousness   Fall    [name] is a 87 y.o. male.  HPI    87 year old man history of AAA, coronary artery disease, hypertension, stroke, discharged yesterday after admission for acute right-sided low back pain, AAA without rupture, and elevated blood pressure.  Patient's medications were changed during hospitalization and he was started on amlodipine and hydralazine, and Florinef was discontinued.  Wife states that he passed out and fell last night.  He struck his back.  His altered mental status was momentary.  He does not have complaints from that fall, but he does have a contusion on his left back.  Today he lost postural tension and patient slumped from his toilet chair to the ground.  He remained awake.  He was unable to respond or get up for several minutes.  Wife called EMS secondary to this.  Past Medical History:  Diagnosis Date   AAA (abdominal aortic aneurysm) ([place])    Aneurysm of iliac artery (HCC)    CAD (coronary artery disease)    a. s/p CABGx4 in 07/2015.   Carotid artery disease ([place])    Colon polyps    Diabetes ([place])    Difficult intubation    Duodenal ulcer disease 08/15/2018   Esophageal reflux    High cholesterol    History of IBS 02/27/2009   Hypertension    pt denies, he says he has a h/o hypotension. If BP up he adjusts the Florinef   Jejunitis with partial SBO 05/20/2017   Orthostatic hypotension    Prostatitis    Stroke [facility])    TIA (transient ischemic attack)    Upper GI bleed 06/24/2018   Vitamin B 12 deficiency    Vitamin D deficiency     Patient Active Problem List   Diagnosis Date Noted   Hypertensive urgency 10/05/2020   Situational depression 01/22/2020   Urinary frequency 01/22/2020   Bacteremia due to Staphylococcus epidermidis 11/24/2019    Stroke (cerebrum) (HCC)    Elevated troponin    Closed right hip fracture ([place]) 11/15/2019   Unilateral primary osteoarthritis, right knee 11/13/2019   At high risk for falls 05/29/2019   Chronic bilateral low back pain without sciatica 02/07/2019   Statin intolerance 11/16/2018   Hyperlipidemia, mixed 08/15/2018   Low back pain 07/18/2018   History of lacunar cerebrovascular accident (CVA) 02/12/2018   Dizziness 02/12/2018   Myofascial pain 12/18/2017   Spondylosis without myelopathy or radiculopathy, lumbar region 12/18/2017   Lumbar radiculopathy, right 12/18/2017   Cholelithiasis 05/17/2017   Sinus Bradycardia 05/17/2017   Thoracic radiculopathy 04/12/2017   Primary osteoarthritis of right knee 12/14/2016   DDD (degenerative disc disease), lumbar 10/10/2016   Lumbar facet arthropathy 08/29/2016   Idiopathic scoliosis 03/22/2016   Diabetic neuropathy ([place]) 03/22/2016   Dysautonomia orthostatic hypotension syndrome 02/25/2016   CKD stage 3 due to type 2 diabetes mellitus ([place]) 02/19/2016   Coronary artery disease involving coronary bypass graft of native heart without angina pectoris    Diabetes mellitus type 2, diet-controlled ([place])    Intercostal neuralgia 12/24/2015   Medication management 08/11/2015   S/P CABG x 4 07/06/2015   PVD (peripheral vascular disease) ([place]) 01/01/2014   Hyperlipidemia associated with type 2 diabetes mellitus ([place]) 04/11/2013   Supine hypertension    Vitamin D deficiency  Vitamin B 12 deficiency    Coronary atherosclerosis- s/p PCI to LAD in 2009 and PCI to RCA in 2011 02/27/2009   Abdominal aortic aneurysm ([place]) 02/27/2009   GERD 02/27/2009    Past Surgical History:  Procedure Laterality Date   BIOPSY  06/25/2018   Procedure: BIOPSY;  Surgeon: Irving Copas., MD;  Location: [place];  Service: Gastroenterology;;   BIOPSY  04/24/2019   Procedure: BIOPSY;  Surgeon: Irving Copas., MD;  Location: [facility];  Service:  Gastroenterology;;   [place]   traumatic pelvic fractures, urethral and bladder repair   CARDIAC CATHETERIZATION N/A 07/01/2015   Procedure: Left Heart Cath and Coronary Angiography;  Surgeon: Wellington Hampshire, MD;  Location: [place] [facility];  Service: Cardiovascular;  Laterality: N/A;   COLON RESECTION N/A 05/17/2017   Procedure: DIAGNOSTIC LAPAROSCOPY,;  Surgeon: Leighton Ruff, MD;  Location: [facility];  Service: General;  Laterality: N/A;   COLONOSCOPY WITH PROPOFOL N/A 04/24/2019   Procedure: COLONOSCOPY WITH PROPOFOL;  Surgeon: Irving Copas., MD;  [facility];  Service: Gastroenterology;  Laterality: N/A;   CORONARY ANGIOPLASTY WITH STENT PLACEMENT     CORONARY ARTERY BYPASS GRAFT N/A 07/06/2015   Procedure: CORONARY ARTERY BYPASS GRAFTING (CABG)x 4   utilizing the left internal mammary artery and endoscopically harvested bilateral  sapheneous vein.;  Surgeon: Ivin Poot, MD;  Location: [place];  Service: Open Heart Surgery;  Laterality: N/A;   ESOPHAGEAL DILATION  04/24/2019   Procedure: ESOPHAGEAL DILATION;  Surgeon: Rush Landmark Telford Nab., MD;  Location[facility];  Service: Gastroenterology;;   ESOPHAGOGASTRODUODENOSCOPY (EGD) WITH PROPOFOL N/A 06/25/2018   Procedure: ESOPHAGOGASTRODUODENOSCOPY (EGD) WITH PROPOFOL;  Surgeon: Irving Copas., MD;  Location: [place];  Service: Gastroenterology;  Laterality: N/A;   ESOPHAGOGASTRODUODENOSCOPY (EGD) WITH PROPOFOL N/A 04/24/2019   Procedure: ESOPHAGOGASTRODUODENOSCOPY (EGD) WITH PROPOFOL;  Surgeon: Rush Landmark Telford Nab., MD;  [facility];  Service: Gastroenterology;  Laterality: N/A;   FEMUR IM NAIL Right 11/16/2019   Procedure: RIGHT HIP INTRAMEDULLARY (IM) NAIL FEMORAL;  Surgeon: Meredith Pel, MD;  Location: [place];  Service: Orthopedics;  Laterality: Right;   HEMOSTASIS CLIP PLACEMENT  04/24/2019   Procedure: HEMOSTASIS CLIP PLACEMENT;  Surgeon: Irving Copas.,  MD;  Location: [facility];  Service: Gastroenterology;;   KNEE SURGERY     LOOP RECORDER INSERTION N/A 02/19/2018   Procedure: LOOP RECORDER INSERTION;  Surgeon: Deboraha Sprang, MD;  Location: [place] [facility];  Service: Cardiovascular;  Laterality: N/A;   POLYPECTOMY  04/24/2019   Procedure: POLYPECTOMY;  Surgeon: Rush Landmark Telford Nab., MD;  Location: [facility];  Service: Gastroenterology;;   SUBMUCOSAL LIFTING INJECTION  04/24/2019   Procedure: SUBMUCOSAL LIFTING INJECTION;  Surgeon: Irving Copas., MD;  [facility];  Service: Gastroenterology;;   TEE WITHOUT CARDIOVERSION N/A 07/06/2015   Procedure: TRANSESOPHAGEAL ECHOCARDIOGRAM (TEE);  Surgeon: Ivin Poot, MD;  Location: [place];  Service: Open Heart Surgery;  Laterality: N/A;       Family History  Problem Relation Age of Onset   Heart attack Father        died age 30   Heart attack Brother        died age 28   Anuerysm Brother        aortic   Heart attack Sister        died age 10   Colon cancer Sister    Liver cancer Sister    Diabetes Maternal Grandmother  Arthritis Mother    Dementia Mother    Heart Problems Brother    Heart Problems Brother    Heart Problems Brother    Heart Problems Brother    Healthy Daughter    Healthy Son    Esophageal cancer Neg Hx    Inflammatory bowel disease Neg Hx    Pancreatic cancer Neg Hx    Stomach cancer Neg Hx     Social History   Tobacco Use   Smoking status: Former    Pack years: 0.00   Smokeless tobacco: Former    Types: Chew    Quit date: 1989  Vaping Use   Vaping Use: Never used  Substance Use Topics   Alcohol use: No   Drug use: No    Home Medications Prior to Admission medications   Medication Sig Start Date End Date Taking? Authorizing Provider  acetaminophen (TYLENOL) 500 MG tablet Take 1,000 mg by mouth in the morning and at bedtime.    [provider]  amLODipine (NORVASC) 10 MG tablet Take 1 tablet (10 mg total)  by mouth daily. 10/08/20   Little Ishikawa, MD  aspirin EC 81 MG tablet Take 2 tablets (162 mg total) by mouth daily. Patient taking differently: Take 81 mg by mouth daily. 10/03/19   Garvin Fila, MD  buPROPion (WELLBUTRIN XL) 150 MG 24 hr tablet TAKE 1 TABLET BY MOUTH EVERY DAY IN THE MORNING 07/13/20   Liane Comber, NP  butalbital-acetaminophen-caffeine (FIORICET) [phone] MG tablet Take     1 tablet      every 4 hours       ONLY       if needed for Severe Headache 03/28/20   [name], MD  Calcium Carbonate-Vit D-Min (CALCIUM 1200 PO) Take 1 tablet by mouth daily.    [provider]  Cholecalciferol (VITAMIN D-3) 5000 units TABS Take 5,000 Units by mouth daily.    [provider]  Cyanocobalamin (VITAMIN B-12) 3000 MCG SUBL Place 1 tablet under the tongue daily.    [provider]  ferrous sulfate 325 (65 FE) MG tablet Take 325 mg by mouth daily with breakfast.    [provider]  glucose blood test strip Check blood sugar 1 time daily-DX-E11.9 Patient taking differently: 1 each by Other route as needed. Check blood sugar 1 time daily-DX-E11.9 07/07/17   [name], MD  hydrALAZINE (APRESOLINE) 25 MG tablet Take 1 tablet (25 mg total) by mouth every 8 (eight) hours. 10/07/20   Little Ishikawa, MD  levothyroxine (SYNTHROID) 25 MCG tablet TAKE 1 TABLET BY MOUTH EVERY DAY ON EMPTY STOMACH WITH ONLY WATER FOR 30 MINS 06/23/20   Liane Comber, NP  meloxicam (MOBIC) 15 MG tablet Take  1/2 to 1 tablet  Daily  with Food  for Pain & Inflammation 08/11/20   [name], MD  memantine (NAMENDA) 10 MG tablet TAKE 1 TABLET BY MOUTH TWICE A DAY 06/22/20   Garvin Fila, MD  Multiple Vitamin (MULTIVITAMIN WITH MINERALS) TABS tablet Take 1 tablet by mouth daily.    [provider]  olmesartan (BENICAR) 40 MG tablet Take 20-40 mg by mouth daily as needed (high blood pressure).    [provider]  pantoprazole (PROTONIX) 40 MG tablet  Take      1 tablet      Daily       to Prevent Indigestion & Heartburn 04/19/20   [name], MD  polyethylene glycol [facility] Floria Raveling) packet  Take 17 g by mouth daily. Patient taking differently: Take 17 g by mouth 2 (two) times daily. 05/22/17   Barton Dubois, MD  Potassium 99 MG TABS Take 1 tablet by mouth daily.    [provider]  pravastatin (PRAVACHOL) 40 MG tablet TAKE 1 TABLET BY MOUTH AT BEDTIME FOR CHOLESTEROL 10/07/20   Liane Comber, NP  tamsulosin (FLOMAX) 0.4 MG CAPS capsule TAKE 1 TABLET 1 HOUR BEFORE BEDTIME FOR BLADDER EMPTYING 05/15/20   Liane Comber, NP  Zinc 50 MG TABS Take 1 tablet by mouth daily.    [provider]    Allergies    Other, Cymbalta [duloxetine hcl], Keflex [cephalexin], Simvastatin, and Sudafed [pseudoephedrine]  Review of Systems   Review of Systems  All other systems reviewed and are negative.  Physical Exam Updated Vital Signs BP 128/62 (BP Location: Right Arm)   Pulse 67   Temp (!) 96.5 F (35.8 C) (Temporal)   Resp 16   Ht 1.829 m (6')   Wt 83.9 kg   SpO2 100%   BMI 25.09 kg/m   Physical Exam Vitals reviewed.  Constitutional:      General: He is not in acute distress.    Appearance: Normal appearance.  HENT:     Right Ear: External ear normal.     Left Ear: External ear normal.     Nose: Nose normal.     Mouth/Throat:     Mouth: Mucous membranes are moist.  Eyes:     Extraocular Movements: Extraocular movements intact.     Pupils: Pupils are equal, round, and reactive to light.  Neck:     Comments: Cervical collar in place No anterior neck trauma noted with trachea midline and carotid pulses 2+ Posterior neck without any signs of trauma no point tenderness noted Cardiovascular:     Rate and Rhythm: Normal rate and regular rhythm.     Pulses: Normal pulses.     Heart sounds: Normal heart sounds.  Pulmonary:     Effort: Pulmonary effort is normal.     Breath sounds: Normal breath sounds.   Abdominal:     General: Abdomen is flat. Bowel sounds are normal.     Palpations: Abdomen is soft.  Musculoskeletal:        General: Normal range of motion.     Comments: Back examined with some tenderness over lower thoracic spine and contusion in the left mid back area noted without crepitus or significant tenderness over the contusion.  Skin:    General: Skin is warm.     Capillary Refill: Capillary refill takes less than 2 seconds.  Neurological:     General: No focal deficit present.     Mental Status: He is alert.     Cranial Nerves: No cranial nerve deficit.     Sensory: No sensory deficit.     Motor: No weakness.     Coordination: Coordination normal.     Deep Tendon Reflexes: Reflexes normal.  Psychiatric:        Mood and Affect: Mood normal.    ED Results / Procedures / Treatments   Labs (all labs ordered are listed, but only abnormal results are displayed) Labs Reviewed  CBC - Abnormal; Notable for the following components:      Result Value   RBC 3.46 (*)    Hemoglobin 10.9 (*)    HCT 35.6 (*)    MCV 102.9 (*)    All other components within normal limits  COMPREHENSIVE METABOLIC  PANEL - Abnormal; Notable for the following components:   Glucose, Bld 117 (*)    BUN 41 (*)    Creatinine, Ser 2.61 (*)    Calcium 8.6 (*)    Total Protein 5.9 (*)    Albumin 3.2 (*)    GFR, Estimated 23 (*)    All other components within normal limits  URINALYSIS, ROUTINE W REFLEX MICROSCOPIC  TROPONIN I (HIGH SENSITIVITY)  TROPONIN I (HIGH SENSITIVITY)    EKG EKG Interpretation  Date/Time:  Thursday October 08 2020 09:04:37 EDT Ventricular Rate:  69 PR Interval:  233 QRS Duration: 129 QT Interval:  468 QTC Calculation: 502 R Axis:   30 Text Interpretation: Sinus rhythm Prolonged PR interval Right bundle branch block Confirmed by Pattricia Boss [phone]) on 10/08/2020 9:24:00 AM  Radiology MR Lumbar Spine W Wo Contrast  Result Date: 10/06/2020 CLINICAL DATA:  Low back pain,  fall from scooter recently EXAM: MRI LUMBAR SPINE WITHOUT AND WITH CONTRAST TECHNIQUE: Multiplanar and multiecho pulse sequences of the lumbar spine were obtained without and with intravenous contrast. CONTRAST:  47mL GADAVIST GADOBUTROL 1 MMOL/ML IV SOLN COMPARISON:  03/22/2020 FINDINGS: Segmentation:  Standard. Alignment: Dextrocurvature. Similar anteroposterior alignment with minor listhesis, for example retrolisthesis at L4-L5. Vertebrae: Similar vertebral body heights with degenerative endplate irregularity and chronic appearing degenerative marrow changes. No marrow edema. No suspicious osseous lesion. Conus medullaris and cauda equina: Conus extends to the L1-L2 level. Conus and cauda equina appear normal. No abnormal intrathecal enhancement. Paraspinal and other soft tissues: Aneurysmal dilatation of the abdominal aorta and common iliacs. The aorta measures up to 4.4 cm above the bifurcation. These findings are discussed on recent CT imaging with follow-up recommendation provided. Disc levels: L1-L2: Small right foraminal protrusion. No canal or foraminal stenosis. Appearance is similar. L2-L3: Disc bulge slightly eccentric to the right. No canal or foraminal stenosis. Appearance is similar. L3-L4: Disc bulge with endplate osteophytic ridging eccentric to the left. Punctate central annular fissure. No canal stenosis. Slight effacement of the left subarticular recess. Minor right and moderate left foraminal stenosis. Appearance is similar. L4-L5: Disc bulge with endplate osteophytic ridging eccentric to the left. Facet arthropathy with ligamentum flavum infolding. Slightly increased mild canal stenosis. Partial effacement of the subarticular recesses. Moderate right and moderate to marked left foraminal stenosis. Appearance is similar. L5-S1: Disc bulge with endplate osteophytic ridging slightly eccentric to the right. Facet arthropathy. No canal stenosis. Slight effacement of the subarticular recesses.  Moderate right and mild left foraminal stenosis. Appearance is similar. IMPRESSION: No new compression deformity. Multilevel degenerative changes as detailed above are overall similar in appearance to the prior study other than slightly increased mild canal stenosis at L4-L5. Electronically Signed   By: Macy Mis M.D.   On: 10/06/2020 15:58    Procedures Procedures   Medications Ordered in ED Medications  sodium chloride 0.9 % bolus 500 mL (has no administration in time range)    ED Course  I have reviewed the triage vital signs and the nursing notes.  Pertinent labs & imaging results that were available during my care of the patient were reviewed by me and considered in my medical decision making (see chart for details).    MDM Rules/Calculators/A&P                          87 year old man who was discharged yesterday after admission secondary to back pain with AAA noted.  Patient had significant hypertension on admission  and had his Florinef discontinued and was started on amlodipine and hydralazine.  He has had 2 falls with associated syncope with the first fall.  Patient was hypertensive on initial evaluation today.  Initial blood pressure 90/50.  Patient given 500 mL of normal saline bolus prehospital and normal saline bolus here in the ED.  Pressure has improved and is now hypertensive. Review of labs revealed that he had now has acute kidney injury with creatinine increased today to 2.6 with last creatinine 1.99 on 6 6. CT head, neck, chest, abdomen obtained due to trauma and fall.  No evidence of acute abnormality noted on these. Will consult hospitalist for readmission due to syncope and kidney injury. Discussed care with Dr. Eliberto Ivory who will see for admission Final Clinical Impression(s) / ED Diagnoses Final diagnoses:  AKI (acute kidney injury) ([place])  Syncope and collapse  Syncope, unspecified syncope type  Orthostatic hypotension    Rx / DC Orders ED Discharge Orders      None        Pattricia Boss, MD 10/08/20 1244

## 2020-10-08 NOTE — Telephone Encounter (Signed)
Plan was to follow up with patient in regard to his recent visit to the ER but today was transported again to the ER for Hypotension.

## 2020-10-08 NOTE — ED Triage Notes (Signed)
[name] reports pt initially on BP meds (fludrocort) to keep his BP up d/t postural hypotension. [name] reports the doctor took him off that medication and put him on two new medications (amlodipine, hydralazine). [name] reports pt took amlodipine prior to DC from hospital and was given hydralazine before bed last night per instructions. [name] reports fall and syncopal episode occurred last night and syncopal episode lasted 30 secs to a min.

## 2020-10-09 ENCOUNTER — Telehealth: Payer: Self-pay | Admitting: Physical Medicine and Rehabilitation

## 2020-10-09 ENCOUNTER — Inpatient Hospital Stay (HOSPITAL_COMMUNITY): Payer: Medicare Other

## 2020-10-09 DIAGNOSIS — R55 Syncope and collapse: Secondary | ICD-10-CM

## 2020-10-09 DIAGNOSIS — N1832 Chronic kidney disease, stage 3b: Secondary | ICD-10-CM

## 2020-10-09 LAB — COMPREHENSIVE METABOLIC PANEL
ALT: 9 U/L (ref 0–44)
AST: 20 U/L (ref 15–41)
Albumin: 3.1 g/dL — ABNORMAL LOW (ref 3.5–5.0)
Alkaline Phosphatase: 85 U/L (ref 38–126)
Anion gap: 9 (ref 5–15)
BUN: 34 mg/dL — ABNORMAL HIGH (ref 8–23)
CO2: 23 mmol/L (ref 22–32)
Calcium: 8.6 mg/dL — ABNORMAL LOW (ref 8.9–10.3)
Chloride: 107 mmol/L (ref 98–111)
Creatinine, Ser: 2.2 mg/dL — ABNORMAL HIGH (ref 0.61–1.24)
GFR, Estimated: 29 mL/min — ABNORMAL LOW (ref >60)
Glucose, Bld: 105 mg/dL — ABNORMAL HIGH (ref 70–99)
Potassium: 4.2 mmol/L (ref 3.5–5.1)
Sodium: 139 mmol/L (ref 135–145)
Total Bilirubin: 0.8 mg/dL (ref 0.3–1.2)
Total Protein: 5.6 g/dL — ABNORMAL LOW (ref 6.5–8.1)

## 2020-10-09 LAB — CBC
HCT: 33.8 % — ABNORMAL LOW (ref 39.0–52.0)
Hemoglobin: 10.8 g/dL — ABNORMAL LOW (ref 13.0–17.0)
MCH: 32.1 pg (ref 26.0–34.0)
MCHC: 32 g/dL (ref 30.0–36.0)
MCV: 100.6 fL — ABNORMAL HIGH (ref 80.0–100.0)
Platelets: 143 10*3/uL — ABNORMAL LOW (ref 150–400)
RBC: 3.36 MIL/uL — ABNORMAL LOW (ref 4.22–5.81)
RDW: 13.6 % (ref 11.5–15.5)
WBC: 7 10*3/uL (ref 4.0–10.5)
nRBC: 0 % (ref 0.0–0.2)

## 2020-10-09 LAB — ECHOCARDIOGRAM COMPLETE
AR max vel: 2.17 cm2
AV Area VTI: 2.11 cm2
AV Area mean vel: 2.13 cm2
AV Mean grad: 3 mmHg
AV Peak grad: 4.8 mmHg
Ao pk vel: 1.1 m/s
Area-P 1/2: 5.54 cm2
Calc EF: 38.1 %
Height: 72 in
MV VTI: 1.84 cm2
S' Lateral: 3.7 cm
Single Plane A2C EF: 44 %
Single Plane A4C EF: 30 %
Weight: 2929.6 oz

## 2020-10-09 LAB — GLUCOSE, CAPILLARY: Glucose-Capillary: 100 mg/dL — ABNORMAL HIGH (ref 70–99)

## 2020-10-09 MED ORDER — FLUDROCORTISONE ACETATE 0.1 MG PO TABS
0.1000 mg | ORAL_TABLET | Freq: Every day | ORAL | Status: DC
  Administered 2020-10-09 – 2020-10-12 (×4): 0.1 mg via ORAL
  Filled 2020-10-09 (×4): qty 1

## 2020-10-09 NOTE — Progress Notes (Signed)
PT working with patient, attempted to get orthostatic VS, BP laying 137/73, HR 81; sitting 120/63, HR 102; standing 85/53, HR 112.  Patient unable to stand for 3 minutes, stated he felt dizzy and felt like he was going to pass out.  Returned to bed.  Repeat VS once returned to lying position BP 148/79, 89.

## 2020-10-09 NOTE — Progress Notes (Signed)
BP 183/94, HR 88.  PA Goodrich notified of elevated BP. Patient denies headache/dizziness.

## 2020-10-09 NOTE — Progress Notes (Signed)
  Echocardiogram 2D Echocardiogram has been performed.  [name] 10/09/2020, 12:04 PM

## 2020-10-09 NOTE — Telephone Encounter (Signed)
Pts wife Estill Bamberg called stating the pt had an appt for 10/06/20 for an inj but due to him being in the hospital they had to cancel. Estill Bamberg would like to get that appt resched. now   [phone]

## 2020-10-09 NOTE — Telephone Encounter (Signed)
Rescheduled

## 2020-10-09 NOTE — Evaluation (Signed)
Physical Therapy Evaluation Patient Details Name: [name] MRN: [id number] DOB: November 21, 1933 Today's Date: 10/09/2020   History of Present Illness  Pt is an 87 y/o male presenting to ED on 10/08/2020 after 2 episodes of syncope and fall at home. Of note, pt seen at [facility] with concern for his AAA at 4.5cm and profound HTN so admitted to [facility] and discharged on 10/07/2020. MRI showed no acute findings and vascular was consulted for AAA with recommendation for no further evaluation or imaging for the AAA. ED course for this admission: troponin negative x 2, Ct head with no acute findings. Ct cervical spine with DDD, CT abdo/pelvis: no acute findings. Ct chest: no acute findings. AAA noted again. PMH: HTN, CAD s/p PCI to LAD in 2009 and PCI to RAC in 2011 and CABGx4, GERD, vitamin D and b12 deficiency, HLD, type 2 diabetes, PVD,  dysautonomia orthostatic hypotension syndrome, DDD lumbar and memory loss  Clinical Impression  Pt tolerates bed mobility and transfers, requiring physical assistance and multiple cues to perform. Pt found to be orthostatic during evaluation, with reports of increased dizziness when sitting at EOB. See general comments for attempted orthostatics. On standing, pt initially denies dizziness but briefly after reports that he needs to sit down. Once seated, pt reports feeling as though he may pass out and is promptly assisted to lie back down. Pt demonstrates deficits in strength, endurance, power, activity tolerance, and balance and will benefit from acute PT to improve safety and independence with mobility. SPT recommends HHPT pending improvement in dizziness with positional changes and mobility. SNF placement recommended if pt mobility has not improved.     Follow Up Recommendations Supervision for mobility/OOB;Home health PT (HHPT pending pt progression. SNF placement if pt unable to progress in mobility.)    Equipment Recommendations  None recommended by PT     Recommendations for Other Services       Precautions / Restrictions Precautions Precautions: Fall Precaution Comments: watch BP Restrictions Weight Bearing Restrictions: No      Mobility  Bed Mobility Overal bed mobility: Needs Assistance Bed Mobility: Supine to Sit;Sit to Supine     Supine to sit: Min assist;HOB elevated Sit to supine: Min assist   General bed mobility comments: heavy use of rail and HHA to come to sit at EOB. Pt reports an increase in dizziness with sitting.    Transfers Overall transfer level: Needs assistance Equipment used: Rolling walker (2 wheeled) Transfers: Sit to/from Stand Sit to Stand: Mod assist         General transfer comment: mod A to power to stand. Pt initally denies dizziness upon standing, unable to tolerate standing after BP taken for orthostatics and reports that he needs to sit down.  Ambulation/Gait             General Gait Details: deferred secondary to reports of dizziness and orthostatics.  Stairs            Wheelchair Mobility    Modified Rankin (Stroke Patients Only)       Balance Overall balance assessment: Needs assistance Sitting-balance support: Bilateral upper extremity supported;Feet supported Sitting balance-Leahy Scale: Poor Sitting balance - Comments: Pt relies upon UE supprt for sitting balance   Standing balance support: Bilateral upper extremity supported;During functional activity Standing balance-Leahy Scale: Poor Standing balance comment: Reliant on BUE support  Pertinent Vitals/Pain Pain Assessment: 0-10 Pain Score: 5  Pain Location: back Pain Descriptors / Indicators: Discomfort Pain Intervention(s): Monitored during session    Home Living Family/patient expects to be discharged to:: Private residence Living Arrangements: Spouse/significant other Available Help at Discharge: Family;Available 24 hours/day Type of Home: House Home  Access: Ramped entrance     Home Layout: One level Home Equipment: Walker - 2 wheels;Cane - single point;Electric scooter;Transport chair;Bedside commode;Wheelchair - manual;Tub bench      Prior Function Level of Independence: Needs assistance   Gait / Transfers Assistance Needed: [name] reports pt at times needs assistance to stand. Pt has lift chair at home. Pt uses both RW and w/c in the home for mobility, w/c use when fatigued. Pt uses electric scooter in community  ADL's / Homemaking Assistance Needed: Wife assists with ADL tasks.        Hand Dominance        Extremity/Trunk Assessment   Upper Extremity Assessment Upper Extremity Assessment: Generalized weakness    Lower Extremity Assessment Lower Extremity Assessment: Generalized weakness    Cervical / Trunk Assessment Cervical / Trunk Assessment: Kyphotic  Communication   Communication: HOH  Cognition Arousal/Alertness: Awake/alert Behavior During Therapy: WFL for tasks assessed/performed Overall Cognitive Status: History of cognitive impairments - at baseline (dementia)                                 General Comments: Pt follows one step commands consistently with increased time. When asked about PLOF, pt reports being independent without use of an AD. [name] corrects pt stating that is inaccurate.      General Comments General comments (skin integrity, edema, etc.): Orthostatics attempted. Supine BP: 137/73, HR 81; Sitting BP: symptomatic and 120/63, HR 102; Standing BP: 85/53, HR 112. Pt unable to stand for 3 minutes, requesting to sit. While seated pt reports feeling like he may pass out. Pt assisted back to supine, reports feeling better with BP 148/79, HR 89.    Exercises     Assessment/Plan    PT Assessment Patient needs continued PT services  PT Problem List Decreased strength;Decreased balance;Decreased activity tolerance;Decreased mobility;Decreased knowledge of use of  DME;Decreased knowledge of precautions;Cardiopulmonary status limiting activity       PT Treatment Interventions DME instruction;Gait training;Functional mobility training;Therapeutic activities;Therapeutic exercise;Balance training;Patient/family education    PT Goals (Current goals can be found in the Care Plan section)  Acute Rehab PT Goals Patient Stated Goal: Get better and go home PT Goal Formulation: With patient/family Time For Goal Achievement: 10/23/20 Potential to Achieve Goals: Good    Frequency Min 3X/week   Barriers to discharge        Co-evaluation               AM-PAC PT "6 Clicks" Mobility  Outcome Measure Help needed turning from your back to your side while in a flat bed without using bedrails?: A Little Help needed moving from lying on your back to sitting on the side of a flat bed without using bedrails?: A Little Help needed moving to and from a bed to a chair (including a wheelchair)?: A Lot Help needed standing up from a chair using your arms (e.g., wheelchair or bedside chair)?: A Lot Help needed to walk in hospital room?: A Lot Help needed climbing 3-5 steps with a railing? : A Lot 6 Click Score: 14    End of Session  Equipment Utilized During Treatment: Gait belt Activity Tolerance: Treatment limited secondary to medical complications (Comment) (orthostatic) Patient left: in bed;with nursing/sitter in room;with family/visitor present;with call bell/phone within reach Nurse Communication: Mobility status PT Visit Diagnosis: Unsteadiness on feet (R26.81);Other abnormalities of gait and mobility (R26.89);Muscle weakness (generalized) (M62.81)    Time: 4967-5916 PT Time Calculation (min) (ACUTE ONLY): 35 min   Charges:   PT Evaluation $PT Eval Low Complexity: 1 Low          Acute Rehab  Pager: [phone]   [employer]win [employer], SPT  10/09/2020, 11:42 AM

## 2020-10-09 NOTE — Progress Notes (Addendum)
Progress Note  Patient Name: [name] Date of Encounter: 10/09/2020  [facility] Cardiologist: Kirk Ruths, MD   Subjective   Patient is eating breakfast this morning, has a good appetite. Wife is at bedside, states patient has urinated a lot. Patient states his back is hurting and has not gotten any pain medication. He denied any chest pain, SOB, dizziness, near syncope sensation.   Inpatient Medications    Scheduled Meds:  amLODipine  5 mg Oral Daily   aspirin EC  162 mg Oral Daily   buPROPion  150 mg Oral Daily   enoxaparin (LOVENOX) injection  30 mg Subcutaneous Q24H   ferrous sulfate  325 mg Oral Q breakfast   fludrocortisone  0.1 mg Oral Daily   levothyroxine  25 mcg Oral QAC breakfast   memantine  10 mg Oral BID   multivitamin with minerals  1 tablet Oral Daily   pantoprazole  40 mg Oral Daily   polyethylene glycol  17 g Oral Daily   pravastatin  40 mg Oral Daily   tamsulosin  0.4 mg Oral QHS   Continuous Infusions:  sodium chloride 50 mL/hr at 10/09/20 0742   PRN Meds: acetaminophen **OR** acetaminophen, butalbital-acetaminophen-caffeine, HYDROcodone-acetaminophen, senna-docusate   Vital Signs    Vitals:   10/09/20 0130 10/09/20 0411 10/09/20 0615 10/09/20 0719  BP:  (!) 142/74 (!) 151/84 (!) 150/80  Pulse:  87  80  Resp:  18  16  Temp: 98.2 F (36.8 C) 97.9 F (36.6 C)  97.6 F (36.4 C)  TempSrc: Oral Oral  Oral  SpO2: 98% 99%  99%  Weight:  83.1 kg    Height:        Intake/Output Summary (Last 24 hours) at 10/09/2020 0752 Last data filed at 10/09/2020 0423 Gross per 24 hour  Intake 1158.75 ml  Output 500 ml  Net 658.75 ml   Last 3 Weights 10/09/2020 10/08/2020 10/08/2020  Weight (lbs) 183 lb 1.6 oz 188 lb 11.4 oz 185 lb  Weight (kg) 83.054 kg 85.6 kg 83.915 kg      Telemetry    Sinus rhythm 90s, with frequent PVCs- Personally Reviewed  ECG   No new EKG today - Personally Reviewed  Physical Exam   GEN: No acute distress.   Eating.  Neck: No JVD Cardiac: RRR, no murmurs, rubs, or gallops.  Respiratory: Clear to auscultation bilaterally. On room air.  GI: Soft, nontender, non-distended  MS: No BLE edema; No deformity. Neuro:  Alert and oriented x self/place, follow commands appopriately   Psych: Normal affect   Labs    High Sensitivity Troponin:   Recent Labs  Lab 10/08/20 0926 10/08/20 1205  TROPONINIHS 15 17      Chemistry Recent Labs  Lab 10/05/20 2316 10/08/20 0926 10/09/20 0429  NA 136 138 139  K 3.8 4.4 4.2  CL 102 105 107  CO2 25 26 23   GLUCOSE 115* 117* 105*  BUN 30* 41* 34*  CREATININE 1.99* 2.61* 2.20*  CALCIUM 8.5* 8.6* 8.6*  PROT 5.7* 5.9* 5.6*  ALBUMIN 3.3* 3.2* 3.1*  AST 15 21 20   ALT 8 11 9   ALKPHOS 102 90 85  BILITOT 0.7 1.0 0.8  GFRNONAA 32* 23* 29*  ANIONGAP 9 7 9      Hematology Recent Labs  Lab 10/05/20 2316 10/08/20 0926 10/09/20 0429  WBC 8.4 7.0 7.0  RBC 3.59* 3.46* 3.36*  HGB 11.4* 10.9* 10.8*  HCT 36.2* 35.6* 33.8*  MCV 100.8* 102.9* 100.6*  MCH 31.8 31.5 32.1  MCHC 31.5 30.6 32.0  RDW 13.6 13.9 13.6  PLT 143* 150 143*    BNP Recent Labs  Lab 10/08/20 1436  BNP 53.8     DDimer No results for input(s): DDIMER in the last 168 hours.   Radiology    CT ABDOMEN PELVIS WO CONTRAST  Result Date: 10/08/2020 CLINICAL DATA:  Patient fell last night. EXAM: CT CHEST, ABDOMEN AND PELVIS WITHOUT CONTRAST TECHNIQUE: Multidetector CT imaging of the chest, abdomen and pelvis was performed following the standard protocol without IV contrast. COMPARISON:  CTA chest abdomen pelvis 10/05/2020 FINDINGS: CT CHEST FINDINGS Cardiovascular: The heart size is normal. No substantial pericardial effusion. Coronary artery calcification is evident. Status post CABG. Atherosclerotic calcification is noted in the wall of the thoracic aorta. Mediastinum/Nodes: No mediastinal lymphadenopathy. No evidence for gross hilar lymphadenopathy although assessment is limited by the  lack of intravenous contrast on today's study. The esophagus has normal imaging features. There is no axillary lymphadenopathy. Lungs/Pleura: Centrilobular and paraseptal emphysema evident. No pneumothorax. Stable 6 mm right upper lobe nodule on 84/5. No new suspicious pulmonary nodule or mass. No focal airspace consolidation. No pleural effusion. Musculoskeletal: No worrisome lytic or sclerotic osseous abnormality. CT ABDOMEN PELVIS FINDINGS Hepatobiliary: No focal abnormality in the liver on this study without intravenous contrast. Layering sludge or tiny stones noted in the gallbladder. No intrahepatic or extrahepatic biliary dilation. Pancreas: No focal mass lesion. No dilatation of the main duct. No intraparenchymal cyst. No peripancreatic edema. Spleen: No splenomegaly. No focal mass lesion. Adrenals/Urinary Tract: No adrenal nodule or mass. Kidneys unremarkable. No evidence for hydroureter. The urinary bladder appears normal for the degree of distention. Stomach/Bowel: Stomach is nondistended. Duodenum is normally positioned as is the ligament of Treitz. No small bowel wall thickening. No small bowel dilatation. The terminal ileum is normal. The appendix is not well visualized, but there is no edema or inflammation in the region of the cecum. No gross colonic mass. No colonic wall thickening. Vascular/Lymphatic: Infrarenal abdominal aortic aneurysm measures up to 4.3 cm. Right common iliac artery is 2.5 cm diameter and left common iliac artery is also 2.5 cm. There is no gastrohepatic or hepatoduodenal ligament lymphadenopathy. No retroperitoneal or mesenteric lymphadenopathy. No pelvic sidewall lymphadenopathy. Reproductive: The prostate gland and seminal vesicles are unremarkable. Other: No intraperitoneal free fluid. Musculoskeletal: No worrisome lytic or sclerotic osseous abnormality. IMPRESSION: 1. No evidence for acute traumatic injury in the chest, abdomen, or pelvis. Sensitivity for evaluating solid  abdominal viscera in the setting of trauma is decreased without intravenous contrast material. Within this limitation, there is no evidence for perihepatic or perisplenic fluid. No free fluid in the peritoneal cavity. 2. 4.3 cm infrarenal abdominal aortic aneurysm with bilateral common iliac artery aneurysms measuring 2.5 cm on the right and 2.5 cm on the left. These are better characterized on CTA exam 2 days ago. Please see that report for follow-up recommendations. 3. Stable 6 mm right upper lobe pulmonary nodule. Non-contrast chest CT at 6-12 months is recommended. If the nodule is stable at time of repeat CT, then future CT at 18-24 months (from today's scan) is considered optional for low-risk patients, but is recommended for high-risk patients. This recommendation follows the consensus statement: Guidelines for Management of Incidental Pulmonary Nodules Detected on CT Images: From the Fleischner Society 2017; Radiology 2017; 284:228-243. 4. Probable cholelithiasis. 5. Aortic Atherosclerosis (ICD10-I70.0) and Emphysema (ICD10-J43.9). Electronically Signed   By: Misty Stanley M.D.   On: 10/08/2020 10:39  CT Head Wo Contrast  Result Date: 10/08/2020 CLINICAL DATA:  Altered mental status EXAM: CT HEAD WITHOUT CONTRAST TECHNIQUE: Contiguous axial images were obtained from the base of the skull through the vertex without intravenous contrast. COMPARISON:  November 22, 2019 FINDINGS: Brain: Moderate diffuse atrophy is stable. Prominence of the cisterna magna is an anatomic variant. There is no appreciable intracranial mass or hemorrhage, extra-axial fluid collection, or midline shift. There is evidence of a prior focal infarct at the inferior midportion left centrum semiovale with involvement of portions of the genu and posterior limb of the left internal capsule and immediately adjacent superior left lentiform nucleus. There is evidence of small infarcts in each anterior thalamus, slightly larger on the left than  on the right. Decreased attenuation is noted throughout much of each centrum semiovale bilaterally. There is decreased attenuation in portions of the midbrain basilar perforator distribution. There is evidence of a prior small infarct in the medial right lower mid cerebellum. No acute infarct is demonstrable on this study. Vascular: No hyperdense vessel. There is calcification in each carotid siphon region and distal vertebral artery. Skull: Bony calvarium appears intact. Sinuses/Orbits: There is mucosal thickening in several ethmoid air cells with opacification posterior ethmoid air cells on the right. Orbits appear symmetric bilaterally. Other: Visualized mastoid air cells are clear. 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 There is also a prior small infarct in the medial inferior right cerebellum. Diffuse decreased attenuation in the centra semiovale is also present, a stable finding likely due to periventricular small vessel disease. A degree of small vessel disease is also noted in the midbrain in the basilar perforator distribution. No acute infarct evident. No mass or hemorrhage. There are multiple foci of arterial vascular calcification. Ethmoid paranasal sinus disease noted. Electronically Signed   By: Lowella Grip III M.D.   On: 10/08/2020 11:21   CT Chest Wo Contrast  Result Date: 10/08/2020 CLINICAL DATA:  Patient fell last night. EXAM: CT CHEST, ABDOMEN AND PELVIS WITHOUT CONTRAST TECHNIQUE: Multidetector CT imaging of the chest, abdomen and pelvis was performed following the standard protocol without IV contrast. COMPARISON:  CTA chest abdomen pelvis 10/05/2020 FINDINGS: CT CHEST FINDINGS Cardiovascular: The heart size is normal. No substantial pericardial effusion. Coronary artery calcification is evident. Status post CABG. Atherosclerotic calcification is noted in the wall of the thoracic aorta. Mediastinum/Nodes: No mediastinal  lymphadenopathy. No evidence for gross hilar lymphadenopathy although assessment is limited by the lack of intravenous contrast on today's study. The esophagus has normal imaging features. There is no axillary lymphadenopathy. Lungs/Pleura: Centrilobular and paraseptal emphysema evident. No pneumothorax. Stable 6 mm right upper lobe nodule on 84/5. No new suspicious pulmonary nodule or mass. No focal airspace consolidation. No pleural effusion. Musculoskeletal: No worrisome lytic or sclerotic osseous abnormality. CT ABDOMEN PELVIS FINDINGS Hepatobiliary: No focal abnormality in the liver on this study without intravenous contrast. Layering sludge or tiny stones noted in the gallbladder. No intrahepatic or extrahepatic biliary dilation. Pancreas: No focal mass lesion. No dilatation of the main duct. No intraparenchymal cyst. No peripancreatic edema. Spleen: No splenomegaly. No focal mass lesion. Adrenals/Urinary Tract: No adrenal nodule or mass. Kidneys unremarkable. No evidence for hydroureter. The urinary bladder appears normal for the degree of distention. Stomach/Bowel: Stomach is nondistended. Duodenum is normally positioned as is the ligament of Treitz. No small bowel wall thickening. No small bowel dilatation. The terminal ileum is normal. The appendix is not well visualized, but there is  no edema or inflammation in the region of the cecum. No gross colonic mass. No colonic wall thickening. Vascular/Lymphatic: Infrarenal abdominal aortic aneurysm measures up to 4.3 cm. Right common iliac artery is 2.5 cm diameter and left common iliac artery is also 2.5 cm. There is no gastrohepatic or hepatoduodenal ligament lymphadenopathy. No retroperitoneal or mesenteric lymphadenopathy. No pelvic sidewall lymphadenopathy. Reproductive: The prostate gland and seminal vesicles are unremarkable. Other: No intraperitoneal free fluid. Musculoskeletal: No worrisome lytic or sclerotic osseous abnormality. IMPRESSION: 1. No  evidence for acute traumatic injury in the chest, abdomen, or pelvis. Sensitivity for evaluating solid abdominal viscera in the setting of trauma is decreased without intravenous contrast material. Within this limitation, there is no evidence for perihepatic or perisplenic fluid. No free fluid in the peritoneal cavity. 2. 4.3 cm infrarenal abdominal aortic aneurysm with bilateral common iliac artery aneurysms measuring 2.5 cm on the right and 2.5 cm on the left. These are better characterized on CTA exam 2 days ago. Please see that report for follow-up recommendations. 3. Stable 6 mm right upper lobe pulmonary nodule. Non-contrast chest CT at 6-12 months is recommended. If the nodule is stable at time of repeat CT, then future CT at 18-24 months (from today's scan) is considered optional for low-risk patients, but is recommended for high-risk patients. This recommendation follows the consensus statement: Guidelines for Management of Incidental Pulmonary Nodules Detected on CT Images: From the Fleischner Society 2017; Radiology 2017; 284:228-243. 4. Probable cholelithiasis. 5. Aortic Atherosclerosis (ICD10-I70.0) and Emphysema (ICD10-J43.9). Electronically Signed   By: Misty Stanley M.D.   On: 10/08/2020 10:39   CT Cervical Spine Wo Contrast  Result Date: 10/08/2020 CLINICAL DATA:  Neck injury after fall last night. EXAM: CT CERVICAL SPINE WITHOUT CONTRAST TECHNIQUE: Multidetector CT imaging of the cervical spine was performed without intravenous contrast. Multiplanar CT image reconstructions were also generated. COMPARISON:  None. FINDINGS: Alignment: Minimal grade 1 retrolisthesis is noted at C5-6 secondary to degenerative disc disease at this level. Skull base and vertebrae: No acute fracture. No primary bone lesion or focal pathologic process. Soft tissues and spinal canal: No prevertebral fluid or swelling. No visible canal hematoma. Disc levels: Moderate degenerative disc disease is noted at C5-6 and C6-7.  Upper chest: Negative. Other: None. IMPRESSION: Moderate multilevel degenerative disc disease. No acute abnormality seen in the cervical spine. Electronically Signed   By: Marijo Conception M.D.   On: 10/08/2020 11:25   VAS US CAROTID  Result Date: 10/08/2020 Carotid Arterial Duplex Study Patient Name:  CHAYCE ROBBINS  Date of Exam:   10/08/2020 Medical Rec #: [id number]          Accession #:    [id number] Date of Birth: 1933/11/23          Patient Gender: M Patient Age:   87Y Exam Location:  [facility] Procedure:      VAS US CAROTID Referring Phys: [id number] Orma Flaming --------------------------------------------------------------------------------  Indications:       Right bruit and Syncope. Risk Factors:      Hypertension, hyperlipidemia, Diabetes, coronary artery                    disease, prior CVA. Comparison Study:  10/21/19 prior Performing Technologist: Archie Patten RVS  Examination Guidelines: A complete evaluation includes B-mode imaging, spectral Doppler, color Doppler, and power Doppler as needed of all accessible portions of each vessel. Bilateral testing is considered an integral part of a complete examination. Limited examinations for reoccurring  indications may be performed as noted.  Right Carotid Findings: +----------+--------+--------+--------+------------------+--------+           PSV cm/sEDV cm/sStenosisPlaque DescriptionComments +----------+--------+--------+--------+------------------+--------+ CCA Prox  50      12              heterogenous               +----------+--------+--------+--------+------------------+--------+ CCA Distal44      13              heterogenous               +----------+--------+--------+--------+------------------+--------+ ICA Prox  163     48      40-59%  heterogenous      tortuous +----------+--------+--------+--------+------------------+--------+ ICA Distal83      23                                          +----------+--------+--------+--------+------------------+--------+ ECA       291     16                                         +----------+--------+--------+--------+------------------+--------+ +----------+--------+-------+--------+-------------------+           PSV cm/sEDV cmsDescribeArm Pressure (mmHG) +----------+--------+-------+--------+-------------------+ [id number]                                         +----------+--------+-------+--------+-------------------+ +---------+--------+--+--------+-+---------+ VertebralPSV cm/s48EDV cm/s5Antegrade +---------+--------+--+--------+-+---------+  Left Carotid Findings: +----------+--------+--------+--------+------------------+--------+           PSV cm/sEDV cm/sStenosisPlaque DescriptionComments +----------+--------+--------+--------+------------------+--------+ CCA Prox  112     26              heterogenous               +----------+--------+--------+--------+------------------+--------+ CCA Distal68      13              heterogenous               +----------+--------+--------+--------+------------------+--------+ ICA Prox  165     43      40-59%  heterogenous      tortuous +----------+--------+--------+--------+------------------+--------+ ICA Distal70      26                                         +----------+--------+--------+--------+------------------+--------+ ECA       140     22                                         +----------+--------+--------+--------+------------------+--------+ +----------+--------+--------+--------+-------------------+           PSV cm/sEDV cm/sDescribeArm Pressure (mmHG) +----------+--------+--------+--------+-------------------+ [id number]                                          +----------+--------+--------+--------+-------------------+ +---------+--------+--+--------+--+---------+ VertebralPSV cm/s50EDV cm/s16Antegrade  +---------+--------+--+--------+--+---------+   Summary: Right Carotid: Velocities in the right ICA are consistent with a 40-59%  stenosis. Left Carotid: Velocities in the left ICA are consistent with a 40-59% stenosis. Vertebrals: Bilateral vertebral arteries demonstrate antegrade flow. *See table(s) above for measurements and observations.  Electronically signed by Monica Martinez MD on 10/08/2020 at 7:42:14 PM.    Final     Cardiac Studies    Carotid doppler 10/08/2020:  Right Carotid: Velocities in the right ICA are consistent with a 40-59% stenosis. Left Carotid: Velocities in the left ICA are consistent with a 40-59% stenosis. Vertebrals: Bilateral vertebral arteries demonstrate antegrade flow.  ILR device check on 09/23/20:  ILR summary report received. Battery status OK. Normal device function. No new symptom, tachy, brady, or pause episodes. No new AF episodes. Monthly summaryreports and ROV/PRN. RP  Echo from 11/19/2019:  1. Left ventricular ejection fraction, by estimation, is 60 to 65%. The left ventricle has normal function. The left ventricle has no regional wall motion abnormalities. There is moderate concentric left ventricular hypertrophy. Left ventricular diastolic parameters are consistent with Grade II diastolic dysfunction (pseudonormalization). Elevated left atrial pressure. 2. Right ventricular systolic function is normal. The right ventricular size is normal. There is normal pulmonary artery systolic pressure. The estimated right ventricular systolic pressure is 02.5 mmHg. 3. Left atrial size was mildly dilated. 4. The mitral valve is degenerative. No evidence of mitral valve regurgitation. No evidence of mitral stenosis. 5. The aortic valve is tricuspid. Aortic valve regurgitation is not visualized. No aortic stenosis is present. 6. The inferior vena cava is normal in size with greater than 50% respiratory variability, suggesting right atrial pressure  of 3 mmHg. Comparison(s): No significant change from prior study.  Left heart cath on 07/01/2015:   Prox RCA to Mid RCA lesion, 60% stenosed. The lesion was previously treated with a stent (unknown type). Ost LAD lesion, 70% stenosed. Ost Cx to Prox Cx lesion, 60% stenosed. Prox LAD to Mid LAD lesion, 10% stenosed. The lesion was previously treated with a stent (unknown type) greater than two years ago. Mid LAD lesion, 99% stenosed. Ost 2nd Diag to 2nd Diag lesion, 70% stenosed. The left ventricular systolic function is normal.    1.  Significant three-vessel coronary artery disease. The culprit for subtotally occluded  Mid LAD which is at the bifurcation of second diagonal which has significant ostial stenosis. There is also ostial LAD stenosis. The proximal LAD stent is patent. There are faint right-to-left collaterals to septal branches.  There is borderline significant disease in the proximal left circumflex as well as borderline significant in-stent restenosis in the right coronary artery.    2. Normal LV systolic function mildly elevated left ventricular end-diastolic pressure.    Recommendations:  The LAD is not favorable for PCI given that it is bifurcation lesion and also there is ostial  LAD stenosis. Thus, I think the patient should be evaluated for CABG  especially that he also has borderline significant disease in the left circumflex and right coronary artery.  PCI can be considered if he is deemed to be too high risk for CABG.  Patient Profile     87 y.o. male with past medical history of CAD s/p CABG x 04 July 2015, hypertension, orthostatic hypotension, s/p loop record implant, CVA in 2019, TIA 08/2019, bilateral carotid stenosis, AAA, right iliac artery aneurysm, left common iliac artery aneurysm, GI bleed 06/2018 due to duodenal ulcer, type 2 diabetes, IBS, hyperlipidemia, , mild dementia, CKD stage III, who was transferred from [place] [facility] to here for further evaluation of  severe back pain, hypertensive  urgency, subsequent hypotension.  Cardiology is following for orthostatic hypotension.   Assessment & Plan   Syncope and collapse Orthostatic hypotension Hypertension -Presented to [facility] 10/04/2020 with severe low back pain (recent fall ) and hypertension 213/116, he was admitted under hospital medicine, home medication Florinef was discontinued and he was discharged amlodipine 10mg , hydralazine 25mg  TID,  olmesartan 40 mg.  He returned to the ER at [facility] on 10/08/2020 for unwitnessed syncope episode and fall, found to be hypotensive by EMS with BP 85/56.  -Historically he has hypertension and orthostatic hypotension, sees Dr. Stanford Breed regularly, on amlodipine 5 mg daily and PRN 20 to 40 mg olmesartan for HTN at home, also Florinef. -Work-up here revealed AKI with creatinine 2.61, chronic anemia with hemoglobin 10.9, Hs troponin negative x2, BNP WNL; CT revealed no acute traumatic injury of chest, abdomen, pelvis; unchanged infrarenal abdominal aortic aneurysm, bilateral common iliac artery aneurysm; no ICH or cervical fracture. -Orthostatic vital signs positive on 10/08/2020 with supine BP 161/83 and AP 86 standing BP 96/63 and AP 108 -Syncope episode is likely due to overmedication with antihypertensive, continue IV fluid hydration, will resume Florinef 0.1 mg daily, discontinue hydralazine 25 TID, and continue amlodipine 5 mg today -will repeat orthostatic vital signs today, resting BP 140-150 over 70s, OK  - optimize pain control  -Echo is pending  CAD with history of CABG in 2017 -High sensitive troponin negative x2, EKG nonischemic, patient denies any chest pain, event is likely due to orthostatic hypotension not ACS -Continue medical therapy with aspirin, pravastatin, not on BB historically  AKI CKD stage IIIb -Creatinine baseline 1.5-2 range over the past year, 2.61 POA -From history, likely due to hypotension/prerenal etiology -Improving with IV  fluids, Cr 2.2 today, UOP low 584ml over the past 24 hours -Tolerating PO well, may stop IVF, trend BMP, avoid nephrotoxic agent  Hyperlipidemia -On pravastatin  Type 2 diabetes -A1c 5.6, controlled on diet  AAA - CTA from 10/05/20 showed 4.5 x 4.1 cm infrarenal abdominal aortic aneurysm, bilateral common iliac artery aneurysms and bilateral internal iliac artery aneurysms -Vascular Surgery was consulted during last hospitalization 6/7-6/8, recommend outpatient follow-up  Hypothyroidism GERD Memory loss Acute on Chronic back pain -Managed per primary team     For questions or updates, please contact [facility] Please consult [email or web address] for contact info under        Signed, Margie Billet, NP  10/09/2020, 7:52 AM

## 2020-10-09 NOTE — Progress Notes (Signed)
Progress Note  Patient Name: [name] Date of Encounter: 10/10/2020  [facility] Cardiologist: Kirk Ruths, MD   Subjective   Orthostatics very positive this AM with SBP 120s with lying-->73s with sitting-->60s with standing. Was dizzy with this .   Currently, he states he wants to go home.   Inpatient Medications    Scheduled Meds:  amLODipine  5 mg Oral Daily   aspirin EC  162 mg Oral Daily   buPROPion  150 mg Oral Daily   enoxaparin (LOVENOX) injection  30 mg Subcutaneous Q24H   ferrous sulfate  325 mg Oral Q breakfast   fludrocortisone  0.1 mg Oral Daily   levothyroxine  25 mcg Oral QAC breakfast   memantine  10 mg Oral BID   multivitamin with minerals  1 tablet Oral Daily   pantoprazole  40 mg Oral Daily   polyethylene glycol  17 g Oral Daily   pravastatin  40 mg Oral Daily   tamsulosin  0.4 mg Oral QHS   Continuous Infusions:  sodium chloride Stopped (10/09/20 2320)   PRN Meds: acetaminophen **OR** acetaminophen, butalbital-acetaminophen-caffeine, HYDROcodone-acetaminophen, senna-docusate   Vital Signs    Vitals:   10/10/20 0602 10/10/20 0833 10/10/20 0926 10/10/20 1125  BP: (!) 160/90 (!) 119/58 107/80 123/81  Pulse: (!) 104 81 77 80  Resp: 16 17    Temp: 98.4 F (36.9 C) 97.7 F (36.5 C)    TempSrc: Oral Oral    SpO2: 95% 96% 97% 97%  Weight:      Height:        Intake/Output Summary (Last 24 hours) at 10/10/2020 1128 Last data filed at 10/10/2020 1100 Gross per 24 hour  Intake 1473.47 ml  Output 2480 ml  Net -1006.53 ml   Last 3 Weights 10/10/2020 10/09/2020 10/08/2020  Weight (lbs) 183 lb 1.6 oz 183 lb 1.6 oz 188 lb 11.4 oz  Weight (kg) 83.054 kg 83.054 kg 85.6 kg      Telemetry    NSR - Personally Reviewed  ECG    No new tracing - Personally Reviewed  Physical Exam   GEN: Elderly, comfortable  Neck: No JVD Cardiac: RRR, no murmurs, rubs, or gallops.  Respiratory: Clear to auscultation bilaterally. GI: Soft,  nontender, non-distended  MS: No edema; No deformity. Neuro:  Nonfocal  Psych: Normal affect   Labs    High Sensitivity Troponin:   Recent Labs  Lab 10/08/20 0926 10/08/20 1205  TROPONINIHS 15 17      Chemistry Recent Labs  Lab 10/05/20 2316 10/08/20 0926 10/09/20 0429 10/10/20 0225  NA 136 138 139 139  K 3.8 4.4 4.2 4.3  CL 102 105 107 105  CO2 25 26 23 26   GLUCOSE 115* 117* 105* 112*  BUN 30* 41* 34* 27*  CREATININE 1.99* 2.61* 2.20* 2.19*  CALCIUM 8.5* 8.6* 8.6* 8.7*  PROT 5.7* 5.9* 5.6*  --   ALBUMIN 3.3* 3.2* 3.1*  --   AST 15 21 20   --   ALT 8 11 9   --   ALKPHOS 102 90 85  --   BILITOT 0.7 1.0 0.8  --   GFRNONAA 32* 23* 29* 29*  ANIONGAP 9 7 9 8      Hematology Recent Labs  Lab 10/05/20 2316 10/08/20 0926 10/09/20 0429  WBC 8.4 7.0 7.0  RBC 3.59* 3.46* 3.36*  HGB 11.4* 10.9* 10.8*  HCT 36.2* 35.6* 33.8*  MCV 100.8* 102.9* 100.6*  MCH 31.8 31.5 32.1  MCHC 31.5 30.6  32.0  RDW 13.6 13.9 13.6  PLT 143* 150 143*    BNP Recent Labs  Lab 10/08/20 1436  BNP 53.8     DDimer No results for input(s): DDIMER in the last 168 hours.   Radiology    ECHOCARDIOGRAM COMPLETE  Result Date: 10/09/2020    ECHOCARDIOGRAM REPORT   Patient Name:   [name] Date of Exam: 10/09/2020 Medical Rec #:  [id number]         Height:       72.0 in Accession #:    [id number]        Weight:       183.1 lb Date of Birth:  01/28/1934         BSA:          2.052 m Patient Age:    87 years          BP:           148/79 mmHg Patient Gender: M                 HR:           77 bpm. Exam Location:  Inpatient Procedure: 2D Echo, Cardiac Doppler and Color Doppler Indications:    Syncope  History:        Patient has prior history of Echocardiogram examinations, most                 recent 11/19/2019. CAD, Prior CABG; Risk Factors:Hypertension and                 Diabetes. GERD. S/P PCI. PVD. Loop recorder.  Sonographer:    Clayton Lefort RDCS (AE) Referring Phys: [id number] [place]  1. Left ventricular ejection fraction, by estimation, is 55 to 60%. The left ventricle has normal function. Left ventricular endocardial border not optimally defined to evaluate regional wall motion. There is severe concentric left ventricular hypertrophy. Indeterminate diastolic filling due to E-A fusion.  2. Right ventricular systolic function is normal. The right ventricular size is normal. There is normal pulmonary artery systolic pressure. The estimated right ventricular systolic pressure is 78.4 mmHg.  3. Left atrial size was severely dilated.  4. The mitral valve is normal in structure. Trivial mitral valve regurgitation. No evidence of mitral stenosis.  5. The aortic valve is normal in structure. Aortic valve regurgitation is not visualized. No aortic stenosis is present.  6. Aortic dilatation noted. There is mild dilatation of the aortic root, measuring 40 mm.  7. The inferior vena cava is normal in size with greater than 50% respiratory variability, suggesting right atrial pressure of 3 mmHg. FINDINGS  Left Ventricle: Left ventricular ejection fraction, by estimation, is 55 to 60%. The left ventricle has normal function. Left ventricular endocardial border not optimally defined to evaluate regional wall motion. The left ventricular internal cavity size was normal in size. There is severe concentric left ventricular hypertrophy. Indeterminate diastolic filling due to E-A fusion. Normal left ventricular filling pressure. Right Ventricle: The right ventricular size is normal. No increase in right ventricular wall thickness. Right ventricular systolic function is normal. There is normal pulmonary artery systolic pressure. The tricuspid regurgitant velocity is 2.42 m/s, and  with an assumed right atrial pressure of 3 mmHg, the estimated right ventricular systolic pressure is 69.6 mmHg. Left Atrium: Left atrial size was severely dilated. Right Atrium: Right atrial size was normal in size.  Pericardium: There is no evidence of pericardial effusion. Mitral Valve: The  mitral valve is normal in structure. Mild mitral annular calcification. Trivial mitral valve regurgitation. No evidence of mitral valve stenosis. MV peak gradient, 10.1 mmHg. The mean mitral valve gradient is 2.0 mmHg. Tricuspid Valve: The tricuspid valve is normal in structure. Tricuspid valve regurgitation is trivial. No evidence of tricuspid stenosis. Aortic Valve: The aortic valve is normal in structure. Aortic valve regurgitation is not visualized. No aortic stenosis is present. Aortic valve mean gradient measures 3.0 mmHg. Aortic valve peak gradient measures 4.8 mmHg. Aortic valve area, by VTI measures 2.11 cm. Pulmonic Valve: The pulmonic valve was normal in structure. Pulmonic valve regurgitation is trivial. No evidence of pulmonic stenosis. Aorta: Aortic dilatation noted. There is mild dilatation of the aortic root, measuring 40 mm. Venous: The inferior vena cava is normal in size with greater than 50% respiratory variability, suggesting right atrial pressure of 3 mmHg. IAS/Shunts: No atrial level shunt detected by color flow Doppler.  LEFT VENTRICLE PLAX 2D LVIDd:         5.00 cm      Diastology LVIDs:         3.70 cm      LV e' medial:    4.57 cm/s LV PW:         1.70 cm      LV E/e' medial:  6.0 LV IVS:        1.70 cm      LV e' lateral:   3.37 cm/s LVOT diam:     2.00 cm      LV E/e' lateral: 8.2 LV SV:         47 LV SV Index:   23 LVOT Area:     3.14 cm  LV Volumes (MOD) LV vol d, MOD A2C: 107.0 ml LV vol d, MOD A4C: 109.0 ml LV vol s, MOD A2C: 59.9 ml LV vol s, MOD A4C: 76.3 ml LV SV MOD A2C:     47.1 ml LV SV MOD A4C:     109.0 ml LV SV MOD BP:      43.7 ml RIGHT VENTRICLE            IVC RV Basal diam:  3.10 cm    IVC diam: 1.70 cm RV S prime:     9.79 cm/s TAPSE (M-mode): 1.7 cm LEFT ATRIUM              Index LA diam:        4.30 cm  2.10 cm/m LA Vol (A2C):   86.1 ml  41.96 ml/m LA Vol (A4C):   101.0 ml 49.22 ml/m LA  Biplane Vol: 99.1 ml  48.29 ml/m  AORTIC VALVE AV Area (Vmax):    2.17 cm AV Area (Vmean):   2.13 cm AV Area (VTI):     2.11 cm AV Vmax:           110.00 cm/s AV Vmean:          78.600 cm/s AV VTI:            0.222 m AV Peak Grad:      4.8 mmHg AV Mean Grad:      3.0 mmHg LVOT Vmax:         76.00 cm/s LVOT Vmean:        53.200 cm/s LVOT VTI:          0.149 m LVOT/AV VTI ratio: 0.67  AORTA Ao Root diam: 4.00 cm Ao Asc diam:  3.40 cm MITRAL VALVE  TRICUSPID VALVE MV Area (PHT): 5.54 cm     TR Peak grad:   23.4 mmHg MV Area VTI:   1.84 cm     TR Vmax:        242.00 cm/s MV Peak grad:  10.1 mmHg MV Mean grad:  2.0 mmHg     SHUNTS MV Vmax:       1.59 m/s     Systemic VTI:  0.15 m MV Vmean:      63.4 cm/s    Systemic Diam: 2.00 cm MV Decel Time: 137 msec MV E velocity: 27.50 cm/s MV A velocity: 125.00 cm/s MV E/A ratio:  0.22 Fransico Him MD Electronically signed by Fransico Him MD Signature Date/Time: 10/09/2020/12:12:10 PM    Final    VAS US CAROTID  Result Date: 10/08/2020 Carotid Arterial Duplex Study Patient Name:  [name]  Date of Exam:   10/08/2020 Medical Rec #: [id number]          Accession #:    [id number] Date of Birth: [birth year]          Patient Gender: M Patient Age:   87Y Exam Location:  [facility] Procedure:      VAS US CAROTID Referring Phys: [id number] Orma Flaming --------------------------------------------------------------------------------  Indications:       Right bruit and Syncope. Risk Factors:      Hypertension, hyperlipidemia, Diabetes, coronary artery                    disease, prior CVA. Comparison Study:  10/21/19 prior Performing Technologist: Archie Patten RVS  Examination Guidelines: A complete evaluation includes B-mode imaging, spectral Doppler, color Doppler, and power Doppler as needed of all accessible portions of each vessel. Bilateral testing is considered an integral part of a complete examination. Limited examinations for reoccurring  indications may be performed as noted.  Right Carotid Findings: +----------+--------+--------+--------+------------------+--------+           PSV cm/sEDV cm/sStenosisPlaque DescriptionComments +----------+--------+--------+--------+------------------+--------+ CCA Prox  50      12              heterogenous               +----------+--------+--------+--------+------------------+--------+ CCA Distal44      13              heterogenous               +----------+--------+--------+--------+------------------+--------+ ICA Prox  163     48      40-59%  heterogenous      tortuous +----------+--------+--------+--------+------------------+--------+ ICA Distal83      23                                         +----------+--------+--------+--------+------------------+--------+ ECA       291     16                                         +----------+--------+--------+--------+------------------+--------+ +----------+--------+-------+--------+-------------------+           PSV cm/sEDV cmsDescribeArm Pressure (mmHG) +----------+--------+-------+--------+-------------------+ [id number]                                         +----------+--------+-------+--------+-------------------+ +---------+--------+--+--------+-+---------+  VertebralPSV cm/s48EDV cm/s5Antegrade +---------+--------+--+--------+-+---------+  Left Carotid Findings: +----------+--------+--------+--------+------------------+--------+           PSV cm/sEDV cm/sStenosisPlaque DescriptionComments +----------+--------+--------+--------+------------------+--------+ CCA Prox  112     26              heterogenous               +----------+--------+--------+--------+------------------+--------+ CCA Distal68      13              heterogenous               +----------+--------+--------+--------+------------------+--------+ ICA Prox  165     43      40-59%  heterogenous      tortuous  +----------+--------+--------+--------+------------------+--------+ ICA Distal70      26                                         +----------+--------+--------+--------+------------------+--------+ ECA       140     22                                         +----------+--------+--------+--------+------------------+--------+ +----------+--------+--------+--------+-------------------+           PSV cm/sEDV cm/sDescribeArm Pressure (mmHG) +----------+--------+--------+--------+-------------------+ [id number]                                          +----------+--------+--------+--------+-------------------+ +---------+--------+--+--------+--+---------+ VertebralPSV cm/s50EDV cm/s16Antegrade +---------+--------+--+--------+--+---------+   Summary: Right Carotid: Velocities in the right ICA are consistent with a 40-59%                stenosis. Left Carotid: Velocities in the left ICA are consistent with a 40-59% stenosis. Vertebrals: Bilateral vertebral arteries demonstrate antegrade flow. *See table(s) above for measurements and observations.  Electronically signed by Monica Martinez MD on 10/08/2020 at 7:42:14 PM.    Final     Cardiac Studies   TTE 10/09/20: IMPRESSIONS   1. Left ventricular ejection fraction, by estimation, is 55 to 60%. The  left ventricle has normal function. Left ventricular endocardial border  not optimally defined to evaluate regional wall motion. There is severe  concentric left ventricular  hypertrophy. Indeterminate diastolic filling due to E-A fusion.   2. Right ventricular systolic function is normal. The right ventricular  size is normal. There is normal pulmonary artery systolic pressure. The  estimated right ventricular systolic pressure is 43.3 mmHg.   3. Left atrial size was severely dilated.   4. The mitral valve is normal in structure. Trivial mitral valve  regurgitation. No evidence of mitral stenosis.   5. The aortic valve is  normal in structure. Aortic valve regurgitation is  not visualized. No aortic stenosis is present.   6. Aortic dilatation noted. There is mild dilatation of the aortic root,  measuring 40 mm.   7. The inferior vena cava is normal in size with greater than 50%  respiratory variability, suggesting right atrial pressure of 3 mmHg.  Carotid doppler 10/08/2020:   Right Carotid: Velocities in the right ICA are consistent with a 40-59% stenosis. Left Carotid: Velocities in the left ICA are consistent with a 40-59% stenosis. Vertebrals: Bilateral vertebral arteries demonstrate antegrade flow.   ILR device check on 09/23/20:  ILR summary report received. Battery status OK. Normal device function. No new symptom, tachy, brady, or pause episodes. No new AF episodes. Monthly summaryreports and ROV/PRN. RP   Echo from 11/19/2019:   1. Left ventricular ejection fraction, by estimation, is 60 to 65%. The left ventricle has normal function. The left ventricle has no regional wall motion abnormalities. There is moderate concentric left ventricular hypertrophy. Left ventricular diastolic parameters are consistent with Grade II diastolic dysfunction (pseudonormalization). Elevated left atrial pressure. 2. Right ventricular systolic function is normal. The right ventricular size is normal. There is normal pulmonary artery systolic pressure. The estimated right ventricular systolic pressure is 53.7 mmHg. 3. Left atrial size was mildly dilated. 4. The mitral valve is degenerative. No evidence of mitral valve regurgitation. No evidence of mitral stenosis. 5. The aortic valve is tricuspid. Aortic valve regurgitation is not visualized. No aortic stenosis is present. 6. The inferior vena cava is normal in size with greater than 50% respiratory variability, suggesting right atrial pressure of 3 mmHg. Comparison(s): No significant change from prior study.   Left heart cath on 07/01/2015:     Prox RCA to Mid RCA  lesion, 60% stenosed. The lesion was previously treated with a stent (unknown type). Ost LAD lesion, 70% stenosed. Ost Cx to Prox Cx lesion, 60% stenosed. Prox LAD to Mid LAD lesion, 10% stenosed. The lesion was previously treated with a stent (unknown type) greater than two years ago. Mid LAD lesion, 99% stenosed. Ost 2nd Diag to 2nd Diag lesion, 70% stenosed. The left ventricular systolic function is normal.    1.  Significant three-vessel coronary artery disease. The culprit for subtotally occluded  Mid LAD which is at the bifurcation of second diagonal which has significant ostial stenosis. There is also ostial LAD stenosis. The proximal LAD stent is patent. There are faint right-to-left collaterals to septal branches.  There is borderline significant disease in the proximal left circumflex as well as borderline significant in-stent restenosis in the right coronary artery.    2. Normal LV systolic function mildly elevated left ventricular end-diastolic pressure.    Recommendations:  The LAD is not favorable for PCI given that it is bifurcation lesion and also there is ostial  LAD stenosis. Thus, I think the patient should be evaluated for CABG  especially that he also has borderline significant disease in the left circumflex and right coronary artery.  PCI can be considered if he is deemed to be too high risk for CABG  Patient Profile     87 y.o. male with past medical history of CAD s/p CABG x 04 July 2015, hypertension, orthostatic hypotension, s/p loop record implant, CVA in 2019, TIA 08/2019, bilateral carotid stenosis, AAA, right iliac artery aneurysm, left common iliac artery aneurysm, GI bleed 06/2018 due to duodenal ulcer, type 2 diabetes, IBS, hyperlipidemia, , mild dementia, CKD stage III, who was transferred from [place] ER to here for further evaluation of severe back pain, hypertensive urgency, subsequent hypotension.  Cardiology is following for orthostatic  hypotension  Assessment & Plan    #Syncope and collapse #Orthostatic hypotension #Hypertension Patient initially presented to [facility] 10/04/2020 with severe low back pain and hypertension 213/116, he was admitted under hospital medicine, home medication Florinef was discontinued and he was discharged amlodipine 10mg , hydralazine 25mg  TID,  olmesartan 40 mg.  He returned to the ER at [facility] on 10/08/2020 for unwitnessed syncope episode and fall, found to be hypotensive by EMS with BP 85/56. Historically he has hypertension  and orthostatic hypotension, sees Dr. Stanford Breed regularly, on amlodipine 5 mg daily and PRN 20 to 40 mg olmesartan for HTN at home, also Florinef. Work-up here revealed AKI with creatinine 2.61, chronic anemia with hemoglobin 10.9, Hs troponin negative x2, BNP WNL; CT revealed no acute traumatic injury of chest, abdomen, pelvis; unchanged infrarenal abdominal aortic aneurysm, bilateral common iliac artery aneurysm; no ICH or cervical fracture. Orthostatic vital signs positive on 10/08/2020 with supine BP 161/83 and AP 86 standing BP 96/63 and AP 108. Syncope episode is likely due to overmedication with antihypertensive. -Continue Florinef 0.1 mg daily -Discontinued hydralazine 25 TID -Will stop amlodipine for now given significantly positive orthostatics this AM   #CAD with history of CABG in 2017 High sensitive troponin negative x2, EKG nonischemic, patient denies any chest pain, event is likely due to orthostatic hypotension not ACS -Continue ASA 81mg  daily -Continue pravastatin 40mg  daily -Not on BB given orthostasis   #AKI CKD stage IIIb Creatinine baseline 1.5-2 range over the past year, 2.61 POA -Continue to trend   #Hyperlipidemia -On pravastatin 40mg  daily   #Type 2 diabetes -A1c 5.6, controlled on diet   #AAA - CTA from 10/05/20 showed 4.5 x 4.1 cm infrarenal abdominal aortic aneurysm, bilateral common iliac artery aneurysms and bilateral internal iliac artery  aneurysms -Vascular Surgery was consulted during last hospitalization 6/7-6/8, recommend outpatient follow-up  #Aortic Root Dilation: Measured 20mm on TTE. -Serial monitoring as out-patient   #Hypothyroidism #GERD #Memory loss #Acute on Chronic back pain -Managed per primary team       For questions or updates, please contact [facility] Please consult [email or web address] for contact info under        Signed, Freada Bergeron, MD  10/10/2020, 11:28 AM

## 2020-10-09 NOTE — Progress Notes (Signed)
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 Electronically Signed   By: Marijo Conception M.D.   On: 10/08/2020 11:25   ECHOCARDIOGRAM COMPLETE  Result Date: 10/09/2020    ECHOCARDIOGRAM REPORT   Patient Name:   THIMOTHY BARRETTA Date of Exam: 10/09/2020 Medical Rec #:  [id number]         Height:       72.0 in Accession #:    [id number]        Weight:       183.1 lb Date of Birth:  1933-08-17         BSA:          2.052 m Patient Age:    71 years          BP:           148/79 mmHg Patient Gender: M                 HR:           77 bpm. Exam Location:  Inpatient Procedure: 2D Echo, Cardiac Doppler and Color Doppler Indications:    Syncope  History:        Patient has prior history of Echocardiogram examinations, most                 recent 11/19/2019. CAD, Prior CABG; Risk Factors:Hypertension and                 Diabetes. GERD. S/P PCI. PVD. Loop recorder.   Sonographer:    Clayton Lefort RDCS (AE) Referring Phys: [id number] [place]  1. Left ventricular ejection fraction, by estimation, is 55 to 60%. The left ventricle has normal function. Left ventricular endocardial border not optimally defined to evaluate regional wall motion. There is severe concentric left ventricular hypertrophy. Indeterminate diastolic filling due to E-A fusion.  2. Right ventricular systolic function is  normal. The right ventricular size is normal. There is normal pulmonary artery systolic pressure. The estimated right ventricular systolic pressure is 17.4 mmHg.  3. Left atrial size was severely dilated.  4. The mitral valve is normal in structure. Trivial mitral valve regurgitation. No evidence of mitral stenosis.  5. The aortic valve is normal in structure. Aortic valve regurgitation is not visualized. No aortic stenosis is present.  6. Aortic dilatation noted. There is mild dilatation of the aortic root, measuring 40 mm.  7. The inferior vena cava is normal in size with greater than 50% respiratory variability, suggesting right atrial pressure of 3 mmHg. FINDINGS  Left Ventricle: Left ventricular ejection fraction, by estimation, is 55 to 60%. The left ventricle has normal function. Left ventricular endocardial border not optimally defined to evaluate regional wall motion. The left ventricular internal cavity size was normal in size. There is severe concentric left ventricular hypertrophy. Indeterminate diastolic filling due to E-A fusion. Normal left ventricular filling pressure. Right Ventricle: The right ventricular size is normal. No increase in right ventricular wall thickness. Right ventricular systolic function is normal. There is normal pulmonary artery systolic pressure. The tricuspid regurgitant velocity is 2.42 m/s, and  with an assumed right atrial pressure of 3 mmHg, the estimated right ventricular systolic pressure is 94.4 mmHg. Left Atrium: Left atrial size was  severely dilated. Right Atrium: Right atrial size was normal in size. Pericardium: There is no evidence of pericardial effusion. Mitral Valve: The mitral valve is normal in structure. Mild mitral annular calcification. Trivial mitral valve regurgitation. No evidence of mitral valve stenosis. MV peak gradient, 10.1 mmHg. The mean mitral valve gradient is 2.0 mmHg. Tricuspid Valve: The tricuspid valve is normal in structure. Tricuspid valve regurgitation is trivial. No evidence of tricuspid stenosis. Aortic Valve: The aortic valve is normal in structure. Aortic valve regurgitation is not visualized. No aortic stenosis is present. Aortic valve mean gradient measures 3.0 mmHg. Aortic valve peak gradient measures 4.8 mmHg. Aortic valve area, by VTI measures 2.11 cm. Pulmonic Valve: The pulmonic valve was normal in structure. Pulmonic valve regurgitation is trivial. No evidence of pulmonic stenosis. Aorta: Aortic dilatation noted. There is mild dilatation of the aortic root, measuring 40 mm. Venous: The inferior vena cava is normal in size with greater than 50% respiratory variability, suggesting right atrial pressure of 3 mmHg. IAS/Shunts: No atrial level shunt detected by color flow Doppler.  LEFT VENTRICLE PLAX 2D LVIDd:         5.00 cm      Diastology LVIDs:         3.70 cm      LV e' medial:    4.57 cm/s LV PW:         1.70 cm      LV E/e' medial:  6.0 LV IVS:        1.70 cm      LV e' lateral:   3.37 cm/s LVOT diam:     2.00 cm      LV E/e' lateral: 8.2 LV SV:         47 LV SV Index:   23 LVOT Area:     3.14 cm  LV Volumes (MOD) LV vol d, MOD A2C: 107.0 ml LV vol d, MOD A4C: 109.0 ml LV vol s, MOD A2C: 59.9 ml LV vol s, MOD A4C: 76.3 ml LV SV MOD A2C:     47.1 ml LV SV MOD A4C:     109.0 ml LV SV  MOD BP:      43.7 ml RIGHT VENTRICLE            IVC RV Basal diam:  3.10 cm    IVC diam: 1.70 cm RV S prime:     9.79 cm/s TAPSE (M-mode): 1.7 cm LEFT ATRIUM              Index LA diam:        4.30 cm  2.10 cm/m LA Vol  (A2C):   86.1 ml  41.96 ml/m LA Vol (A4C):   101.0 ml 49.22 ml/m LA Biplane Vol: 99.1 ml  48.29 ml/m  AORTIC VALVE AV Area (Vmax):    2.17 cm AV Area (Vmean):   2.13 cm AV Area (VTI):     2.11 cm AV Vmax:           110.00 cm/s AV Vmean:          78.600 cm/s AV VTI:            0.222 m AV Peak Grad:      4.8 mmHg AV Mean Grad:      3.0 mmHg LVOT Vmax:         76.00 cm/s LVOT Vmean:        53.200 cm/s LVOT VTI:          0.149 m LVOT/AV VTI ratio: 0.67  AORTA Ao Root diam: 4.00 cm Ao Asc diam:  3.40 cm MITRAL VALVE                TRICUSPID VALVE MV Area (PHT): 5.54 cm     TR Peak grad:   23.4 mmHg MV Area VTI:   1.84 cm     TR Vmax:        242.00 cm/s MV Peak grad:  10.1 mmHg MV Mean grad:  2.0 mmHg     SHUNTS MV Vmax:       1.59 m/s     Systemic VTI:  0.15 m MV Vmean:      63.4 cm/s    Systemic Diam: 2.00 cm MV Decel Time: 137 msec MV E velocity: 27.50 cm/s MV A velocity: 125.00 cm/s MV E/A ratio:  0.22 Fransico Him MD Electronically signed by Fransico Him MD Signature Date/Time: 10/09/2020/12:12:10 PM    Final    VAS US CAROTID  Result Date: 10/08/2020 Carotid Arterial Duplex Study Patient Name:  YOVANI COGBURN  Date of Exam:   10/08/2020 Medical Rec #: [id number]          Accession #:    [id number] Date of Birth: 02-19-1934          Patient Gender: M Patient Age:   40Y Exam Location:  [facility] Procedure:      VAS US CAROTID Referring Phys: [id number] Orma Flaming --------------------------------------------------------------------------------  Indications:       Right bruit and Syncope. Risk Factors:      Hypertension, hyperlipidemia, Diabetes, coronary artery                    disease, prior CVA. Comparison Study:  10/21/19 prior Performing Technologist: Archie Patten RVS  Examination Guidelines: A complete evaluation includes B-mode imaging, spectral Doppler, color Doppler, and power Doppler as needed of all accessible portions of each vessel. Bilateral testing is considered an integral part of  a complete examination. Limited examinations for reoccurring indications may be performed as noted.  Right Carotid Findings: +----------+--------+--------+--------+------------------+--------+  PSV cm/sEDV cm/sStenosisPlaque DescriptionComments +----------+--------+--------+--------+------------------+--------+ CCA Prox  50      12              heterogenous               +----------+--------+--------+--------+------------------+--------+ CCA Distal44      13              heterogenous               +----------+--------+--------+--------+------------------+--------+ ICA Prox  163     48      40-59%  heterogenous      tortuous +----------+--------+--------+--------+------------------+--------+ ICA Distal83      23                                         +----------+--------+--------+--------+------------------+--------+ ECA       291     16                                         +----------+--------+--------+--------+------------------+--------+ +----------+--------+-------+--------+-------------------+           PSV cm/sEDV cmsDescribeArm Pressure (mmHG) +----------+--------+-------+--------+-------------------+ [id number]                                         +----------+--------+-------+--------+-------------------+ +---------+--------+--+--------+-+---------+ VertebralPSV cm/s48EDV cm/s5Antegrade +---------+--------+--+--------+-+---------+  Left Carotid Findings: +----------+--------+--------+--------+------------------+--------+           PSV cm/sEDV cm/sStenosisPlaque DescriptionComments +----------+--------+--------+--------+------------------+--------+ CCA Prox  112     26              heterogenous               +----------+--------+--------+--------+------------------+--------+ CCA Distal68      13              heterogenous               +----------+--------+--------+--------+------------------+--------+ ICA Prox   165     43      40-59%  heterogenous      tortuous +----------+--------+--------+--------+------------------+--------+ ICA Distal70      26                                         +----------+--------+--------+--------+------------------+--------+ ECA       140     22                                         +----------+--------+--------+--------+------------------+--------+ +----------+--------+--------+--------+-------------------+           PSV cm/sEDV cm/sDescribeArm Pressure (mmHG) +----------+--------+--------+--------+-------------------+ [id number]                                          +----------+--------+--------+--------+-------------------+ +---------+--------+--+--------+--+---------+ VertebralPSV cm/s50EDV cm/s16Antegrade +---------+--------+--+--------+--+---------+   Summary: Right Carotid: Velocities in the right ICA are consistent with a 40-59%                stenosis. Left Carotid: Velocities in the left ICA are consistent  with a 40-59% stenosis. Vertebrals: Bilateral vertebral arteries demonstrate antegrade flow. *See table(s) above for measurements and observations.  Electronically signed by Monica Martinez MD on 10/08/2020 at 7:42:14 PM.    Final         Scheduled Meds:  amLODipine  5 mg Oral Daily   aspirin EC  162 mg Oral Daily   buPROPion  150 mg Oral Daily   enoxaparin (LOVENOX) injection  30 mg Subcutaneous Q24H   ferrous sulfate  325 mg Oral Q breakfast   fludrocortisone  0.1 mg Oral Daily   levothyroxine  25 mcg Oral QAC breakfast   memantine  10 mg Oral BID   multivitamin with minerals  1 tablet Oral Daily   pantoprazole  40 mg Oral Daily   polyethylene glycol  17 g Oral Daily   pravastatin  40 mg Oral Daily   tamsulosin  0.4 mg Oral QHS   Continuous Infusions:  sodium chloride 50 mL/hr at 10/09/20 0742     LOS: 1 day    Time spent: 42 minutes spent on chart review, discussion with nursing staff, consultants,  updating family and interview/physical exam; more than 50% of that time was spent in counseling and/or coordination of care.    Quadir Muns J [place] DO [facility] Available via Epic secure chat 7am-7pm After these hours, please refer to coverage provider listed on [email or web address] 10/09/2020, 2:04 PM

## 2020-10-09 NOTE — Telephone Encounter (Signed)
Called patient's wife to reschedule. Patient has been readmitted to hospital.

## 2020-10-10 LAB — BASIC METABOLIC PANEL
Anion gap: 8 (ref 5–15)
BUN: 27 mg/dL — ABNORMAL HIGH (ref 8–23)
CO2: 26 mmol/L (ref 22–32)
Calcium: 8.7 mg/dL — ABNORMAL LOW (ref 8.9–10.3)
Chloride: 105 mmol/L (ref 98–111)
Creatinine, Ser: 2.19 mg/dL — ABNORMAL HIGH (ref 0.61–1.24)
GFR, Estimated: 29 mL/min — ABNORMAL LOW (ref >60)
Glucose, Bld: 112 mg/dL — ABNORMAL HIGH (ref 70–99)
Potassium: 4.3 mmol/L (ref 3.5–5.1)
Sodium: 139 mmol/L (ref 135–145)

## 2020-10-10 LAB — GLUCOSE, CAPILLARY
Glucose-Capillary: 112 mg/dL — ABNORMAL HIGH (ref 70–99)
Glucose-Capillary: 106 mg/dL — ABNORMAL HIGH (ref 70–99)

## 2020-10-10 MED ORDER — ASPIRIN EC 81 MG PO TBEC
81.0000 mg | DELAYED_RELEASE_TABLET | Freq: Every day | ORAL | Status: DC
  Administered 2020-10-11 – 2020-10-12 (×2): 81 mg via ORAL
  Filled 2020-10-10 (×2): qty 1

## 2020-10-10 NOTE — Progress Notes (Signed)
C/o dizziness and unsteady on feet with orthostatics while standing. BP = 60/33 upon standing. Unable to complete 3 min orthostatic standing bp. Assisted back to bed. Dizziness resolving. Will cont to monitor.
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
## 2020-10-10 NOTE — Progress Notes (Signed)
PT Cancellation Note  Patient Details Name: [name] MRN: [id number] DOB: [birth year]   Cancelled Treatment:    Reason Eval/Treat Not Completed: Medical issues which prohibited therapy.  Standing BP is in 78'G diastolically this AM.  Will wait for him to be medically more stable to attempt mobility with PT.  Follow up as time and pt allow.   Ramond Dial 10/10/2020, 11:02 AM  [name], PT MS Acute Rehab Dept. Number: [place]

## 2020-10-11 DIAGNOSIS — I10 Essential (primary) hypertension: Secondary | ICD-10-CM

## 2020-10-11 LAB — GLUCOSE, CAPILLARY: Glucose-Capillary: 112 mg/dL — ABNORMAL HIGH (ref 70–99)

## 2020-10-11 LAB — BASIC METABOLIC PANEL
Anion gap: 8 (ref 5–15)
BUN: 26 mg/dL — ABNORMAL HIGH (ref 8–23)
CO2: 25 mmol/L (ref 22–32)
Calcium: 8.5 mg/dL — ABNORMAL LOW (ref 8.9–10.3)
Chloride: 105 mmol/L (ref 98–111)
Creatinine, Ser: 2.05 mg/dL — ABNORMAL HIGH (ref 0.61–1.24)
GFR, Estimated: 31 mL/min — ABNORMAL LOW (ref >60)
Glucose, Bld: 111 mg/dL — ABNORMAL HIGH (ref 70–99)
Potassium: 4.1 mmol/L (ref 3.5–5.1)
Sodium: 138 mmol/L (ref 135–145)

## 2020-10-11 LAB — URINE CULTURE: Culture: 10000 — AB

## 2020-10-11 NOTE — Progress Notes (Signed)
Progress Note  Patient Name: [name] Date of Encounter: 10/11/2020  [facility] Cardiologist: Kirk Ruths, MD   Subjective   Laying blood pressure elevated at 163/93. Orthostatics not done this AM. Cr improved to 2.05   Inpatient Medications    Scheduled Meds:  aspirin EC  81 mg Oral Daily   buPROPion  150 mg Oral Daily   enoxaparin (LOVENOX) injection  30 mg Subcutaneous Q24H   ferrous sulfate  325 mg Oral Q breakfast   fludrocortisone  0.1 mg Oral Daily   levothyroxine  25 mcg Oral QAC breakfast   memantine  10 mg Oral BID   multivitamin with minerals  1 tablet Oral Daily   pantoprazole  40 mg Oral Daily   polyethylene glycol  17 g Oral Daily   pravastatin  40 mg Oral Daily   tamsulosin  0.4 mg Oral QHS   Continuous Infusions:  sodium chloride 50 mL/hr at 10/11/20 0848   PRN Meds: acetaminophen **OR** acetaminophen, butalbital-acetaminophen-caffeine, HYDROcodone-acetaminophen, senna-docusate   Vital Signs    Vitals:   10/10/20 1125 10/10/20 1210 10/10/20 1934 10/11/20 0501  BP: 123/81  (!) 145/76 (!) 163/93  Pulse: 80 82 91   Resp:    16  Temp:  97.6 F (36.4 C) 98.7 F (37.1 C) 98.5 F (36.9 C)  TempSrc:  Oral Oral Oral  SpO2: 97% 100% 97% 97%  Weight:    85.2 kg  Height:        Intake/Output Summary (Last 24 hours) at 10/11/2020 1056 Last data filed at 10/11/2020 0710 Gross per 24 hour  Intake 797.71 ml  Output 975 ml  Net -177.29 ml    Last 3 Weights 10/11/2020 10/10/2020 10/09/2020  Weight (lbs) 187 lb 14.4 oz 183 lb 1.6 oz 183 lb 1.6 oz  Weight (kg) 85.231 kg 83.054 kg 83.054 kg      Telemetry    NSR with PVCs - Personally Reviewed  ECG    No new tracing - Personally Reviewed  Physical Exam   GEN: Elderly, comfortable  Neck: No JVD Cardiac: RRR, no murmurs, rubs, or gallops.  Respiratory: Clear to auscultation bilaterally. GI: Soft, nontender, non-distended  MS: No edema; No deformity. Neuro:  Nonfocal  Psych:  Normal affect   Labs    High Sensitivity Troponin:   Recent Labs  Lab 10/08/20 0926 10/08/20 1205  TROPONINIHS 15 17       Chemistry Recent Labs  Lab 10/05/20 2316 10/08/20 0926 10/09/20 0429 10/10/20 0225 10/11/20 0129  NA 136 138 139 139 138  K 3.8 4.4 4.2 4.3 4.1  CL 102 105 107 105 105  CO2 25 26 23 26 25   GLUCOSE 115* 117* 105* 112* 111*  BUN 30* 41* 34* 27* 26*  CREATININE 1.99* 2.61* 2.20* 2.19* 2.05*  CALCIUM 8.5* 8.6* 8.6* 8.7* 8.5*  PROT 5.7* 5.9* 5.6*  --   --   ALBUMIN 3.3* 3.2* 3.1*  --   --   AST 15 21 20   --   --   ALT 8 11 9   --   --   ALKPHOS 102 90 85  --   --   BILITOT 0.7 1.0 0.8  --   --   GFRNONAA 32* 23* 29* 29* 31*  ANIONGAP 9 7 9 8 8       Hematology Recent Labs  Lab 10/05/20 2316 10/08/20 0926 10/09/20 0429  WBC 8.4 7.0 7.0  RBC 3.59* 3.46* 3.36*  HGB 11.4* 10.9* 10.8*  HCT 36.2* 35.6* 33.8*  MCV 100.8* 102.9* 100.6*  MCH 31.8 31.5 32.1  MCHC 31.5 30.6 32.0  RDW 13.6 13.9 13.6  PLT 143* 150 143*     BNP Recent Labs  Lab 10/08/20 1436  BNP 53.8      DDimer No results for input(s): DDIMER in the last 168 hours.   Radiology    ECHOCARDIOGRAM COMPLETE  Result Date: 10/09/2020    ECHOCARDIOGRAM REPORT   Patient Name:   [name] Date of Exam: 10/09/2020 Medical Rec #:  [id number]         Height:       72.0 in Accession #:    [id number]        Weight:       183.1 lb Date of Birth:  1934/01/01         BSA:          2.052 m Patient Age:    87 years          BP:           148/79 mmHg Patient Gender: M                 HR:           77 bpm. Exam Location:  Inpatient Procedure: 2D Echo, Cardiac Doppler and Color Doppler Indications:    Syncope  History:        Patient has prior history of Echocardiogram examinations, most                 recent 11/19/2019. CAD, Prior CABG; Risk Factors:Hypertension and                 Diabetes. GERD. S/P PCI. PVD. Loop recorder.  Sonographer:    Clayton Lefort RDCS (AE) Referring Phys: [id number]  [name]  1. Left ventricular ejection fraction, by estimation, is 55 to 60%. The left ventricle has normal function. Left ventricular endocardial border not optimally defined to evaluate regional wall motion. There is severe concentric left ventricular hypertrophy. Indeterminate diastolic filling due to E-A fusion.  2. Right ventricular systolic function is normal. The right ventricular size is normal. There is normal pulmonary artery systolic pressure. The estimated right ventricular systolic pressure is 40.1 mmHg.  3. Left atrial size was severely dilated.  4. The mitral valve is normal in structure. Trivial mitral valve regurgitation. No evidence of mitral stenosis.  5. The aortic valve is normal in structure. Aortic valve regurgitation is not visualized. No aortic stenosis is present.  6. Aortic dilatation noted. There is mild dilatation of the aortic root, measuring 40 mm.  7. The inferior vena cava is normal in size with greater than 50% respiratory variability, suggesting right atrial pressure of 3 mmHg. FINDINGS  Left Ventricle: Left ventricular ejection fraction, by estimation, is 55 to 60%. The left ventricle has normal function. Left ventricular endocardial border not optimally defined to evaluate regional wall motion. The left ventricular internal cavity size was normal in size. There is severe concentric left ventricular hypertrophy. Indeterminate diastolic filling due to E-A fusion. Normal left ventricular filling pressure. Right Ventricle: The right ventricular size is normal. No increase in right ventricular wall thickness. Right ventricular systolic function is normal. There is normal pulmonary artery systolic pressure. The tricuspid regurgitant velocity is 2.42 m/s, and  with an assumed right atrial pressure of 3 mmHg, the estimated right ventricular systolic pressure is 02.7 mmHg. Left Atrium: Left atrial size was severely dilated. Right  Atrium: Right atrial size was normal in  size. Pericardium: There is no evidence of pericardial effusion. Mitral Valve: The mitral valve is normal in structure. Mild mitral annular calcification. Trivial mitral valve regurgitation. No evidence of mitral valve stenosis. MV peak gradient, 10.1 mmHg. The mean mitral valve gradient is 2.0 mmHg. Tricuspid Valve: The tricuspid valve is normal in structure. Tricuspid valve regurgitation is trivial. No evidence of tricuspid stenosis. Aortic Valve: The aortic valve is normal in structure. Aortic valve regurgitation is not visualized. No aortic stenosis is present. Aortic valve mean gradient measures 3.0 mmHg. Aortic valve peak gradient measures 4.8 mmHg. Aortic valve area, by VTI measures 2.11 cm. Pulmonic Valve: The pulmonic valve was normal in structure. Pulmonic valve regurgitation is trivial. No evidence of pulmonic stenosis. Aorta: Aortic dilatation noted. There is mild dilatation of the aortic root, measuring 40 mm. Venous: The inferior vena cava is normal in size with greater than 50% respiratory variability, suggesting right atrial pressure of 3 mmHg. IAS/Shunts: No atrial level shunt detected by color flow Doppler.  LEFT VENTRICLE PLAX 2D LVIDd:         5.00 cm      Diastology LVIDs:         3.70 cm      LV e' medial:    4.57 cm/s LV PW:         1.70 cm      LV E/e' medial:  6.0 LV IVS:        1.70 cm      LV e' lateral:   3.37 cm/s LVOT diam:     2.00 cm      LV E/e' lateral: 8.2 LV SV:         47 LV SV Index:   23 LVOT Area:     3.14 cm  LV Volumes (MOD) LV vol d, MOD A2C: 107.0 ml LV vol d, MOD A4C: 109.0 ml LV vol s, MOD A2C: 59.9 ml LV vol s, MOD A4C: 76.3 ml LV SV MOD A2C:     47.1 ml LV SV MOD A4C:     109.0 ml LV SV MOD BP:      43.7 ml RIGHT VENTRICLE            IVC RV Basal diam:  3.10 cm    IVC diam: 1.70 cm RV S prime:     9.79 cm/s TAPSE (M-mode): 1.7 cm LEFT ATRIUM              Index LA diam:        4.30 cm  2.10 cm/m LA Vol (A2C):   86.1 ml  41.96 ml/m LA Vol (A4C):   101.0 ml 49.22  ml/m LA Biplane Vol: 99.1 ml  48.29 ml/m  AORTIC VALVE AV Area (Vmax):    2.17 cm AV Area (Vmean):   2.13 cm AV Area (VTI):     2.11 cm AV Vmax:           110.00 cm/s AV Vmean:          78.600 cm/s AV VTI:            0.222 m AV Peak Grad:      4.8 mmHg AV Mean Grad:      3.0 mmHg LVOT Vmax:         76.00 cm/s LVOT Vmean:        53.200 cm/s LVOT VTI:          0.149 m LVOT/AV  VTI ratio: 0.67  AORTA Ao Root diam: 4.00 cm Ao Asc diam:  3.40 cm MITRAL VALVE                TRICUSPID VALVE MV Area (PHT): 5.54 cm     TR Peak grad:   23.4 mmHg MV Area VTI:   1.84 cm     TR Vmax:        242.00 cm/s MV Peak grad:  10.1 mmHg MV Mean grad:  2.0 mmHg     SHUNTS MV Vmax:       1.59 m/s     Systemic VTI:  0.15 m MV Vmean:      63.4 cm/s    Systemic Diam: 2.00 cm MV Decel Time: 137 msec MV E velocity: 27.50 cm/s MV A velocity: 125.00 cm/s MV E/A ratio:  0.22 Fransico Him MD Electronically signed by Fransico Him MD Signature Date/Time: 10/09/2020/12:12:10 PM    Final     Cardiac Studies   TTE 10/09/20: IMPRESSIONS   1. Left ventricular ejection fraction, by estimation, is 55 to 60%. The  left ventricle has normal function. Left ventricular endocardial border  not optimally defined to evaluate regional wall motion. There is severe  concentric left ventricular  hypertrophy. Indeterminate diastolic filling due to E-A fusion.   2. Right ventricular systolic function is normal. The right ventricular  size is normal. There is normal pulmonary artery systolic pressure. The  estimated right ventricular systolic pressure is 11.9 mmHg.   3. Left atrial size was severely dilated.   4. The mitral valve is normal in structure. Trivial mitral valve  regurgitation. No evidence of mitral stenosis.   5. The aortic valve is normal in structure. Aortic valve regurgitation is  not visualized. No aortic stenosis is present.   6. Aortic dilatation noted. There is mild dilatation of the aortic root,  measuring 40 mm.   7. The  inferior vena cava is normal in size with greater than 50%  respiratory variability, suggesting right atrial pressure of 3 mmHg.  Carotid doppler 10/08/2020:   Right Carotid: Velocities in the right ICA are consistent with a 40-59% stenosis. Left Carotid: Velocities in the left ICA are consistent with a 40-59% stenosis. Vertebrals: Bilateral vertebral arteries demonstrate antegrade flow.   ILR device check on 09/23/20:   ILR summary report received. Battery status OK. Normal device function. No new symptom, tachy, brady, or pause episodes. No new AF episodes. Monthly summaryreports and ROV/PRN. RP   Echo from 11/19/2019:   1. Left ventricular ejection fraction, by estimation, is 60 to 65%. The left ventricle has normal function. The left ventricle has no regional wall motion abnormalities. There is moderate concentric left ventricular hypertrophy. Left ventricular diastolic parameters are consistent with Grade II diastolic dysfunction (pseudonormalization). Elevated left atrial pressure. 2. Right ventricular systolic function is normal. The right ventricular size is normal. There is normal pulmonary artery systolic pressure. The estimated right ventricular systolic pressure is 41.7 mmHg. 3. Left atrial size was mildly dilated. 4. The mitral valve is degenerative. No evidence of mitral valve regurgitation. No evidence of mitral stenosis. 5. The aortic valve is tricuspid. Aortic valve regurgitation is not visualized. No aortic stenosis is present. 6. The inferior vena cava is normal in size with greater than 50% respiratory variability, suggesting right atrial pressure of 3 mmHg. Comparison(s): No significant change from prior study.   Left heart cath on 07/01/2015:     Prox RCA to Mid RCA lesion, 60% stenosed. The  lesion was previously treated with a stent (unknown type). Ost LAD lesion, 70% stenosed. Ost Cx to Prox Cx lesion, 60% stenosed. Prox LAD to Mid LAD lesion, 10% stenosed. The  lesion was previously treated with a stent (unknown type) greater than two years ago. Mid LAD lesion, 99% stenosed. Ost 2nd Diag to 2nd Diag lesion, 70% stenosed. The left ventricular systolic function is normal.    1.  Significant three-vessel coronary artery disease. The culprit for subtotally occluded  Mid LAD which is at the bifurcation of second diagonal which has significant ostial stenosis. There is also ostial LAD stenosis. The proximal LAD stent is patent. There are faint right-to-left collaterals to septal branches.  There is borderline significant disease in the proximal left circumflex as well as borderline significant in-stent restenosis in the right coronary artery.    2. Normal LV systolic function mildly elevated left ventricular end-diastolic pressure.    Recommendations:  The LAD is not favorable for PCI given that it is bifurcation lesion and also there is ostial  LAD stenosis. Thus, I think the patient should be evaluated for CABG  especially that he also has borderline significant disease in the left circumflex and right coronary artery.  PCI can be considered if he is deemed to be too high risk for CABG  Patient Profile     87 y.o. male with past medical history of CAD s/p CABG x 04 July 2015, hypertension, orthostatic hypotension, s/p loop record implant, CVA in 2019, TIA 08/2019, bilateral carotid stenosis, AAA, right iliac artery aneurysm, left common iliac artery aneurysm, GI bleed 06/2018 due to duodenal ulcer, type 2 diabetes, IBS, hyperlipidemia, , mild dementia, CKD stage III, who was transferred from [facility] to here for further evaluation of severe back pain, hypertensive urgency, subsequent hypotension.  Cardiology is following for orthostatic hypotension  Assessment & Plan    #Syncope and collapse #Orthostatic hypotension #Hypertension Patient initially presented to [facility] 10/04/2020 with severe low back pain and hypertension 213/116, he was admitted  under hospital medicine, home medication Florinef was discontinued and he was discharged amlodipine 10mg , hydralazine 25mg  TID,  olmesartan 40 mg.  He returned to the ER at [facility] on 10/08/2020 for unwitnessed syncope episode and fall, found to be hypotensive by EMS with BP 85/56. Historically he has hypertension and orthostatic hypotension, sees Dr. Stanford Breed regularly, on amlodipine 5 mg daily and PRN 20 to 40 mg olmesartan for HTN at home, also Florinef. Work-up here revealed AKI with creatinine 2.61, chronic anemia with hemoglobin 10.9, Hs troponin negative x2, BNP WNL; CT revealed no acute traumatic injury of chest, abdomen, pelvis; unchanged infrarenal abdominal aortic aneurysm, bilateral common iliac artery aneurysm; no ICH or cervical fracture. Orthostatic vital signs positive on 10/08/2020 with supine BP 161/83 and AP 86 standing BP 96/63 and AP 108. Syncope episode is likely due to overmedication with antihypertensive. -Continue Florinef 0.1 mg daily -Discontinued hydralazine 25 TID -Holding amlodipine for now; will tolerate higher blood pressures to prevent profound orthostasis -Okay to discharge home if stable from orthostatic perspective; likely will continue to have positive orthostats but goal is for him to have minimal symptoms with SBP>90 with standing    #CAD with history of CABG in 2017 High sensitive troponin negative x2, EKG nonischemic, patient denies any chest pain, event is likely due to orthostatic hypotension not ACS -Continue ASA 81mg  daily -Continue pravastatin 40mg  daily -Not on BB given orthostasis   #AKI CKD stage IIIb Improved. Creatinine baseline 1.5-2 range  over the past year, 2.61 POA -Improving   #Hyperlipidemia -On pravastatin 40mg  daily   #Type 2 diabetes -A1c 5.6, controlled on diet   #AAA - CTA from 10/05/20 showed 4.5 x 4.1 cm infrarenal abdominal aortic aneurysm, bilateral common iliac artery aneurysms and bilateral internal iliac artery  aneurysms -Vascular Surgery was consulted during last hospitalization 6/7-6/8, recommend outpatient follow-up  #Aortic Root Dilation: Measured 28mm on TTE. -Serial monitoring as out-patient   #Hypothyroidism #GERD #Memory loss #Acute on Chronic back pain -Managed per primary team       For questions or updates, please contact [facility] Please consult [email or web address] for contact info under        Signed, Freada Bergeron, MD  10/11/2020, 10:56 AM
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
## 2020-10-12 LAB — BASIC METABOLIC PANEL
Anion gap: 7 (ref 5–15)
BUN: 24 mg/dL — ABNORMAL HIGH (ref 8–23)
CO2: 28 mmol/L (ref 22–32)
Calcium: 8.8 mg/dL — ABNORMAL LOW (ref 8.9–10.3)
Chloride: 104 mmol/L (ref 98–111)
Creatinine, Ser: 1.97 mg/dL — ABNORMAL HIGH (ref 0.61–1.24)
GFR, Estimated: 33 mL/min — ABNORMAL LOW (ref >60)
Glucose, Bld: 115 mg/dL — ABNORMAL HIGH (ref 70–99)
Potassium: 4.3 mmol/L (ref 3.5–5.1)
Sodium: 139 mmol/L (ref 135–145)

## 2020-10-12 LAB — GLUCOSE, CAPILLARY: Glucose-Capillary: 115 mg/dL — ABNORMAL HIGH (ref 70–99)

## 2020-10-12 MED ORDER — MEMANTINE HCL 10 MG PO TABS: 10.0000 mg | ORAL_TABLET | Freq: Two times a day (BID) | ORAL | Status: AC

## 2020-10-12 MED ORDER — BUTALBITAL-APAP-CAFFEINE 50-325-40 MG PO TABS: 1.0000 | ORAL_TABLET | ORAL | Status: AC | PRN

## 2020-10-12 MED ORDER — BUPROPION HCL ER (XL) 150 MG PO TB24: 150.0000 mg | ORAL_TABLET | Freq: Every day | ORAL | Status: AC

## 2020-10-12 MED ORDER — GLUCOSE BLOOD VI STRP: 1.0000 | ORAL_STRIP | Status: AC | PRN

## 2020-10-12 MED ORDER — MELOXICAM 15 MG PO TABS: 7.5000 mg | ORAL_TABLET | Freq: Every day | ORAL | Status: AC

## 2020-10-12 MED ORDER — FLUDROCORTISONE ACETATE 0.1 MG PO TABS: 0.1000 mg | ORAL_TABLET | Freq: Every day | ORAL | Status: DC

## 2020-10-12 MED ORDER — PRAVASTATIN SODIUM 40 MG PO TABS: 40.0000 mg | ORAL_TABLET | Freq: Every day | ORAL | Status: AC

## 2020-10-12 MED ORDER — PANTOPRAZOLE SODIUM 40 MG PO TBEC: 40.0000 mg | DELAYED_RELEASE_TABLET | Freq: Every day | ORAL | Status: AC

## 2020-10-12 MED ORDER — ASPIRIN EC 81 MG PO TBEC: 81.0000 mg | DELAYED_RELEASE_TABLET | Freq: Every day | ORAL | Status: AC

## 2020-10-12 MED ORDER — LEVOTHYROXINE SODIUM 25 MCG PO TABS
25.0000 ug | ORAL_TABLET | Freq: Every day | ORAL | Status: AC
Start: 2020-10-12 — End: ?

## 2020-10-12 NOTE — Care Management Important Message (Signed)
Important Message  Patient Details  Name: [name] MRN: [id number] Date of Birth: 10-12-1933   Medicare Important Message Given:  Yes     Shelda Altes 10/12/2020, 10:48 AM

## 2020-10-12 NOTE — Plan of Care (Signed)

## 2020-10-12 NOTE — Progress Notes (Signed)
Physical Therapy Treatment Patient Details Name: [name] MRN: [id number] DOB: 1933/08/07 Today's Date: 10/12/2020    History of Present Illness Pt is an 87 y/o male presenting to ED on 10/08/2020 after 2 episodes of syncope and fall at home. Of note, pt seen at [facility] with concern for his AAA at 4.5cm and profound HTN so admitted to [facility] and discharged on 10/07/2020. MRI showed no acute findings and vascular was consulted for AAA with recommendation for no further evaluation or imaging for the AAA. ED course for this admission: troponin negative x 2, Ct head with no acute findings. Ct cervical spine with DDD, CT abdo/pelvis: no acute findings. Ct chest: no acute findings. AAA noted again. PMH: HTN, CAD s/p PCI to LAD in 2009 and PCI to RAC in 2011 and CABGx4, GERD, vitamin D and b12 deficiency, HLD, type 2 diabetes, PVD,  dysautonomia orthostatic hypotension syndrome, DDD lumbar and memory loss    PT Comments    Patient progressing very slowly towards PT goals. Continues to be limited by orthostasis, weakness and impaired balance. Requires Mod A for bed mobility and Min-Mod A for static sitting balance. Reports the room is spinning with any movement, worsened in sitting/standing with LOB mostly towards left/posteriorly when sitting. Tolerated very short bouts of ambulation with Mod A and use of RW for support however limited due to lightheadedness.  Supine BP 142/83, HR 94 bpm Sitting BP 127/84, HR 99 bpm Standing BP 113/64, HR 115 bpm Sitting post walk 90/50 Sitting post second walk 66/46 Sitting in chair end of session BP 112/74 post exercise   Had nurse donn ted hose prior to session, however not sure this helped with BP at all. Can try ace wraps next session? Encouraged OOB in chair for all meals to acclimate BP regulation as pt reports he has been staying in bed most of the day. Wife present and aware of recommendations. Discharge recommendation updated to SNF as pt is a high fall  risk at this time and not safe to return home due to above. Will follow.   Follow Up Recommendations  SNF;Supervision for mobility/OOB     Equipment Recommendations  None recommended by PT    Recommendations for Other Services       Precautions / Restrictions Precautions Precautions: Fall;Other (comment) Precaution Comments: watch BP- orthostatic Restrictions Weight Bearing Restrictions: No    Mobility  Bed Mobility Overal bed mobility: Needs Assistance Bed Mobility: Rolling;Sidelying to Sit Rolling: Min assist Sidelying to sit: Mod assist;HOB elevated       General bed mobility comments: Heavy use of rail with assist to place RUE there and help with elevating trunk to get to EOB, posterior bias once upright esp with LOB to the left; reaching for rails for support. + lightheadedness    Transfers Overall transfer level: Needs assistance Equipment used: Rolling walker (2 wheeled) Transfers: Sit to/from Stand Sit to Stand: Mod assist         General transfer comment: Mod A to power to standing with cues for hand/foot placement and anterior weight shift as pt with posterior bias; stood from EOB x2, from chair x1. + lightheadedness with standing.  Ambulation/Gait Ambulation/Gait assistance: Mod assist Gait Distance (Feet): 5 Feet (+ 4') Assistive device: Rolling walker (2 wheeled) Gait Pattern/deviations: Step-through pattern;Decreased stride length;Narrow base of support;Leaning posteriorly Gait velocity: Decreased   General Gait Details: Slow, unsteady gait with narrow BoS and posterior bias; bil knee instability but no buckling. + lightheadedness that worsens  with standing. 1 seated rest break.   Stairs             Wheelchair Mobility    Modified Rankin (Stroke Patients Only)       Balance Overall balance assessment: Needs assistance Sitting-balance support: Feet supported;Bilateral upper extremity supported Sitting balance-Leahy Scale:  Poor Sitting balance - Comments: Requires UE support and external support for static sitting balance due to lightheadedness, "the room is spinning" LOB mostly to the left but also posteriorly. Min-Mod A for balance.   Standing balance support: During functional activity Standing balance-Leahy Scale: Poor Standing balance comment: Reliant on BUE support and external support for static standing.                            Cognition Arousal/Alertness: Awake/alert Behavior During Therapy: WFL for tasks assessed/performed Overall Cognitive Status: No family/caregiver present to determine baseline cognitive functioning                                 General Comments: Requires step by step cues to follow commands with repetition. Not the best historian or reporter of symptoms. Hx of dementia. poor attention.      Exercises      General Comments General comments (skin integrity, edema, etc.): Supine BP 142/83, HR 94 bpm, Sitting BP 127/84, HR 99 bpm; Standing BP 113/64, HR 115 bpm; Sitting post walk 90/50, sitting post second walk 66/46; sitting in chair end of session BP 112/74 post exercise.      Pertinent Vitals/Pain Pain Assessment: Faces Faces Pain Scale: Hurts even more Pain Location: back Pain Descriptors / Indicators: Discomfort;Grimacing;Guarding Pain Intervention(s): Monitored during session;Repositioned    Home Living                      Prior Function            PT Goals (current goals can now be found in the care plan section) Progress towards PT goals: Progressing toward goals (slowly)    Frequency    Min 3X/week      PT Plan Discharge plan needs to be updated    Co-evaluation              AM-PAC PT "6 Clicks" Mobility   Outcome Measure  Help needed turning from your back to your side while in a flat bed without using bedrails?: A Little Help needed moving from lying on your back to sitting on the side of a flat  bed without using bedrails?: A Lot Help needed moving to and from a bed to a chair (including a wheelchair)?: A Lot Help needed standing up from a chair using your arms (e.g., wheelchair or bedside chair)?: A Lot Help needed to walk in hospital room?: A Lot Help needed climbing 3-5 steps with a railing? : A Lot 6 Click Score: 13    End of Session Equipment Utilized During Treatment: Gait belt Activity Tolerance: Treatment limited secondary to medical complications (Comment) (orthostasis) Patient left: in chair;with call bell/phone within reach;with family/visitor present Nurse Communication: Mobility status;Other (comment) (BP) PT Visit Diagnosis: Unsteadiness on feet (R26.81);Other abnormalities of gait and mobility (R26.89);Muscle weakness (generalized) (M62.81)     Time: 8242-3536 PT Time Calculation (min) (ACUTE ONLY): 27 min  Charges:  $Gait Training: 8-22 mins $Therapeutic Activity: 8-22 mins  Zettie Cooley, DPT Acute Rehabilitation Services Pager [phone] Office [place] 10/12/2020, 10:58 AM

## 2020-10-12 NOTE — TOC Initial Note (Signed)
Transition of Care [facility]) - Initial/Assessment Note    Patient Details  Name: [name] MRN: [id number] Date of Birth: 08-12-1933  Transition of [facility]) CM/SW Contact:    Bethena Roys, RN Phone Number: 10/12/2020, 4:33 PM  Clinical Narrative:  Patient wants to return home with home health services. Case Manager spoke with wife and she can provide 24 hour supervision for the patient. Family has used [facility] in the past and wants to use them again. Referral made to [facility] and start of care to begin within 24-48 hours post transition home. Wife to provide transportation home via private vehicle.               Expected Discharge Plan: [place] Barriers to Discharge: No Barriers Identified   Patient Goals and CMS Choice Patient states their goals for this hospitalization and ongoing recovery are:: to return home with home health services CMS Medicare.gov Compare Post Acute Care list provided to:: Patient Choice offered to / list presented to : Patient, Spouse  Expected Discharge Plan and Services Expected Discharge Plan: [place]   Discharge Planning Services: CM Consult Post Acute Care Choice: Home Health   Expected Discharge Date: 10/07/20                 DME Agency: NA       HH Arranged: RN, Disease Management, PT, OT, Nurse's Aide HH Agency: [facility] ([place]) Date HH Agency Contacted: 10/12/20 Time HH Agency Contacted: 37 Representative spoke with at [place]: [place] Arrangements/Services     Patient language and need for interpreter reviewed:: Yes Do you feel safe going back to the place where you live?: Yes      Need for Family Participation in Patient Care: Yes (Comment) Care giver support system in place?: Yes (comment)   Criminal Activity/Legal Involvement Pertinent to Current Situation/Hospitalization: No - Comment as needed  Activities of Daily Living       Permission Sought/Granted Permission sought to share information with : Case Manager, Customer service manager, Family Supports Permission granted to share information with : Yes, Verbal Permission Granted     Permission granted to share info w AGENCY: [place]        Emotional Assessment Appearance:: Appears stated age Attitude/Demeanor/Rapport: Engaged Affect (typically observed): Appropriate Orientation: : Oriented to Self Alcohol / Substance Use: Not Applicable Psych Involvement: No (comment)  Admission diagnosis:  Elevated blood pressure reading [R03.0] Hypertensive urgency [I16.0] Fall from motorized mobility scooter, initial encounter [V00.831A] Abdominal aortic aneurysm (AAA) without rupture ([place]) [I71.4] Acute right-sided low back pain without sciatica [M54.50] Patient Active Problem List   Diagnosis Date Noted   Syncope and collapse 10/08/2020   Acute renal failure superimposed on stage 3b chronic kidney disease ([place]) 10/08/2020   Memory loss 10/08/2020   Hypothyroid 10/08/2020   Hypertensive urgency 10/05/2020   Situational depression 01/22/2020   Urinary frequency 01/22/2020   Bacteremia due to Staphylococcus epidermidis 11/24/2019   Stroke (cerebrum) ([place])    Elevated troponin    Closed right hip fracture ([place]) 11/15/2019   Unilateral primary osteoarthritis, right knee 11/13/2019   At high risk for falls 05/29/2019   Chronic bilateral low back pain without sciatica 02/07/2019   Statin intolerance 11/16/2018   Hyperlipidemia, mixed 08/15/2018   Low back pain 07/18/2018   History of lacunar cerebrovascular accident (CVA) 02/12/2018   Dizziness 02/12/2018   Myofascial pain 12/18/2017  Spondylosis without myelopathy or radiculopathy, lumbar region 12/18/2017   Lumbar radiculopathy, right 12/18/2017   AKI (acute kidney injury) ([place])    Cholelithiasis 05/17/2017   Sinus Bradycardia 05/17/2017   Thoracic radiculopathy 04/12/2017    Primary osteoarthritis of right knee 12/14/2016   DDD (degenerative disc disease), lumbar 10/10/2016   Lumbar facet arthropathy 08/29/2016   Idiopathic scoliosis 03/22/2016   Diabetic neuropathy ([place]) 03/22/2016   Dysautonomia orthostatic hypotension syndrome 02/25/2016   CKD stage 3 due to type 2 diabetes mellitus ([place]) 02/19/2016   Coronary artery disease involving coronary bypass graft of native heart without angina pectoris    Diabetes mellitus type 2, diet-controlled (HCC)    Intercostal neuralgia 12/24/2015   Medication management 08/11/2015   S/P CABG x 4 07/06/2015   PVD (peripheral vascular disease) ([place]) 01/01/2014   Hyperlipidemia associated with type 2 diabetes mellitus ([place]) 04/11/2013   HTN (hypertension)    Vitamin D deficiency    Vitamin B 12 deficiency    Coronary atherosclerosis- s/p PCI to LAD in 2009 and PCI to RCA in 2011 02/27/2009   Abdominal aortic aneurysm ([place]) 02/27/2009   GERD 02/27/2009   PCP:  [name], MD Pharmacy:   [id number] - [place], [place] - [address] [place] [address] Phone: [phone] Fax: [phone]     Social Determinants of Health (SDOH) Interventions    Readmission Risk Interventions No flowsheet data found.

## 2020-10-12 NOTE — Discharge Summary (Signed)
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 INTERMEDIATE Intermediate     TRIMETH/SULFA >=320 RESISTANT Resistant     AMPICILLIN/SULBACTAM <=2 SENSITIVE Sensitive     PIP/TAZO <=4 SENSITIVE Sensitive     * 10,000 COLONIES/mL MORGANELLA MORGANII     Labs: BNP (last 3 results) Recent Labs    10/08/20 1436  BNP 00.9   Basic Metabolic Panel: Recent Labs  Lab 10/08/20 0926 10/09/20 0429 10/10/20 0225 10/11/20 0129 10/12/20 0610  NA 138 139 139 138 139  K 4.4 4.2 4.3 4.1 4.3  CL 105 107 105 105 104  CO2 26 23 26 25 28   GLUCOSE 117* 105* 112* 111* 115*  BUN 41* 34* 27* 26* 24*  CREATININE 2.61* 2.20* 2.19* 2.05* 1.97*  CALCIUM 8.6* 8.6* 8.7* 8.5* 8.8*   Liver Function Tests: Recent Labs  Lab 10/05/20 2316 10/08/20 0926 10/09/20 0429  AST 15 21 20   ALT 8 11 9   ALKPHOS 102 90 85  BILITOT 0.7 1.0 0.8  PROT 5.7* 5.9* 5.6*  ALBUMIN 3.3* 3.2* 3.1*   No results for input(s): LIPASE, AMYLASE in the last 168 hours. No results for input(s): AMMONIA in the last 168 hours. CBC: Recent Labs  Lab 10/05/20 2316 10/08/20 0926 10/09/20 0429  WBC 8.4 7.0 7.0  HGB 11.4* 10.9* 10.8*  HCT 36.2* 35.6* 33.8*  MCV 100.8* 102.9* 100.6*  PLT 143* 150 143*   Cardiac Enzymes: No results for input(s): CKTOTAL, CKMB, CKMBINDEX, TROPONINI in the last 168 hours. BNP: Invalid input(s): POCBNP CBG: Recent Labs  Lab 10/09/20 0617 10/10/20 0554 10/10/20 0655 10/11/20 0500 10/12/20 0737  GLUCAP 100* 112* 106* 112* 115*   D-Dimer No results for input(s): DDIMER in the last 72 hours. Hgb A1c No results for input(s): HGBA1C in the last 72 hours. Lipid Profile No results for input(s): CHOL, HDL, LDLCALC, TRIG, CHOLHDL, LDLDIRECT in the last 72 hours. Thyroid function studies No results for input(s): TSH, T4TOTAL, T3FREE, THYROIDAB in the last 72 hours.  Invalid input(s): FREET3 Anemia work up No results for input(s): VITAMINB12, FOLATE, FERRITIN, TIBC, IRON, RETICCTPCT in the last 72  hours. Urinalysis    Component Value Date/Time   COLORURINE YELLOW 10/08/2020 1154   APPEARANCEUR CLEAR 10/08/2020 1154   LABSPEC 1.011 10/08/2020 1154   PHURINE 6.0 10/08/2020 1154   GLUCOSEU NEGATIVE 10/08/2020 1154   HGBUR SMALL (A) 10/08/2020 1154   BILIRUBINUR NEGATIVE 10/08/2020 1154   KETONESUR NEGATIVE 10/08/2020 1154   PROTEINUR NEGATIVE 10/08/2020 1154   UROBILINOGEN 1.0 01/05/2015 1550   NITRITE NEGATIVE 10/08/2020 1154   LEUKOCYTESUR NEGATIVE 10/08/2020 1154   Sepsis Labs Invalid input(s): PROCALCITONIN,  WBC,  LACTICIDVEN Microbiology Recent Results (from the past 240 hour(s))  Resp Panel by RT-PCR (Flu A&B, Covid) Nasopharyngeal Swab     Status: None   Collection Time: 10/05/20 12:48 PM   Specimen: Nasopharyngeal Swab; Nasopharyngeal(NP) swabs in vial transport medium  Result Value Ref Range Status   SARS Coronavirus 2 by RT PCR NEGATIVE NEGATIVE Final    Comment: (NOTE) SARS-CoV-2 target nucleic acids are NOT DETECTED.  The SARS-CoV-2 RNA is generally detectable in upper respiratory specimens during the acute phase of infection. The lowest concentration of SARS-CoV-2 viral copies this assay can detect is 138 copies/mL. A negative result does not preclude SARS-Cov-2 infection and should not be used as the sole basis for treatment or other patient management decisions. A negative result may occur with  improper specimen collection/handling, submission of specimen other than nasopharyngeal swab, presence of viral mutation(s) within the areas targeted by this assay, and inadequate number of viral copies(<138 copies/mL). A negative result must be combined with clinical observations, patient history, and epidemiological information. The expected result is Negative.  Fact Sheet for Patients:  EntrepreneurPulse.com.au  Fact Sheet for Healthcare Providers:  IncredibleEmployment.be  This test is no t yet approved or cleared by  the [place] FDA and  has been authorized for detection and/or diagnosis of SARS-CoV-2 by FDA under an Emergency Use Authorization (EUA). This EUA will remain  in effect (meaning this test can be used) for the duration of the COVID-19 declaration under Section 564(b)(1) of the Act, 21 U.S.C.section 360bbb-3(b)(1), unless the authorization is terminated  or revoked sooner.       Influenza A by PCR NEGATIVE NEGATIVE Final   Influenza B by PCR NEGATIVE NEGATIVE Final    Comment: (NOTE) The Xpert Xpress SARS-CoV-2/FLU/RSV plus assay is intended as an aid in the diagnosis of influenza from  Nasopharyngeal swab specimens and should not be used as a sole basis for treatment. Nasal washings and aspirates are unacceptable for Xpert Xpress SARS-CoV-2/FLU/RSV testing.  Fact Sheet for Patients: EntrepreneurPulse.com.au  Fact Sheet for Healthcare Providers: IncredibleEmployment.be  This test is not yet approved or cleared by the [place] FDA and has been authorized for detection and/or diagnosis of SARS-CoV-2 by FDA under an Emergency Use Authorization (EUA). This EUA will remain in effect (meaning this test can be used) for the duration of the COVID-19 declaration under Section 564(b)(1) of the Act, 21 U.S.C. section 360bbb-3(b)(1), unless the authorization is terminated or revoked.  Performed at [facility], [place], [place] [address]   SARS CORONAVIRUS 2 (TAT 6-24 HRS) Nasopharyngeal Nasopharyngeal Swab     Status: None   Collection Time: 10/08/20  1:02 PM   Specimen: Nasopharyngeal Swab  Result Value Ref Range Status   SARS Coronavirus 2 NEGATIVE NEGATIVE Final    Comment: (NOTE) SARS-CoV-2 target nucleic acids are NOT DETECTED.  The SARS-CoV-2 RNA is generally detectable in upper and lower respiratory specimens during the acute phase of infection. Negative results do not preclude SARS-CoV-2  infection, do not rule out co-infections with other pathogens, and should not be used as the sole basis for treatment or other patient management decisions. Negative results must be combined with clinical observations, patient history, and epidemiological information. The expected result is Negative.  Fact Sheet for Patients: SugarRoll.be  Fact Sheet for Healthcare Providers: https://www.woods-mathews.com/  This test is not yet approved or cleared by the [place] FDA and  has been authorized for detection and/or diagnosis of SARS-CoV-2 by FDA under an Emergency Use Authorization (EUA). This EUA will remain  in effect (meaning this test can be used) for the duration of the COVID-19 declaration under Se ction 564(b)(1) of the Act, 21 U.S.C. section 360bbb-3(b)(1), unless the authorization is terminated or revoked sooner.  Performed at [facility], [place] [address], [place], [place] [address]   Urine culture     Status: Abnormal   Collection Time: 10/08/20  1:31 PM   Specimen: Urine, Random  Result Value Ref Range Status   Specimen Description URINE, RANDOM  Final   Special Requests   Final    NONE Performed at [facility], [place] [address], [place], [place] [address]    Culture 10,000 COLONIES/mL [facility] MORGANII (A)  Final   Report Status 10/11/2020 FINAL  Final   Organism ID, Bacteria MORGANELLA MORGANII (A)  Final      Susceptibility   Morganella morganii - MIC*    AMPICILLIN 4 SENSITIVE Sensitive     CEFAZOLIN 8 SENSITIVE Sensitive     CIPROFLOXACIN >=4 RESISTANT Resistant     GENTAMICIN <=1 SENSITIVE Sensitive     IMIPENEM 0.5 SENSITIVE Sensitive     NITROFURANTOIN 64 INTERMEDIATE Intermediate     TRIMETH/SULFA >=320 RESISTANT Resistant     AMPICILLIN/SULBACTAM <=2 SENSITIVE Sensitive     PIP/TAZO <=4 SENSITIVE Sensitive     * 10,000 COLONIES/mL MORGANELLA MORGANII     Time coordinating discharge: Over 30  minutes  SIGNED:   Alice Vitelli J [place] DO  [facility] 10/12/2020, 3:00 PM

## 2020-10-12 NOTE — Progress Notes (Addendum)
Progress Note  Patient Name: [name] Date of Encounter: 10/12/2020  [facility] Cardiologist: Kirk Ruths, MD   Subjective   Patient states he wants pain meds. He is a poor historian, has memory deficit. Wife is at bedside, states patient got orthostatic and was not released over the weekend. He has not been out of bed yesterday. He is eating and drinking well.   Inpatient Medications    Scheduled Meds:  aspirin EC  81 mg Oral Daily   buPROPion  150 mg Oral Daily   enoxaparin (LOVENOX) injection  30 mg Subcutaneous Q24H   ferrous sulfate  325 mg Oral Q breakfast   fludrocortisone  0.1 mg Oral Daily   levothyroxine  25 mcg Oral QAC breakfast   memantine  10 mg Oral BID   multivitamin with minerals  1 tablet Oral Daily   pantoprazole  40 mg Oral Daily   polyethylene glycol  17 g Oral Daily   pravastatin  40 mg Oral Daily   Continuous Infusions:  sodium chloride 50 mL/hr at 10/12/20 0630   PRN Meds: acetaminophen **OR** acetaminophen, butalbital-acetaminophen-caffeine, HYDROcodone-acetaminophen, senna-docusate   Vital Signs    Vitals:   10/11/20 0501 10/11/20 2211 10/12/20 0100 10/12/20 0402  BP: (!) 163/93 (!) 171/86 (!) 146/97 (!) 168/90  Pulse:   95 99  Resp: 16 18 19 20   Temp: 98.5 F (36.9 C) 98.4 F (36.9 C) 98.4 F (36.9 C) 98.4 F (36.9 C)  TempSrc: Oral Oral Oral Oral  SpO2: 97% 94% 95% 95%  Weight: 85.2 kg   83.8 kg  Height:        Intake/Output Summary (Last 24 hours) at 10/12/2020 0732 Last data filed at 10/12/2020 0630 Gross per 24 hour  Intake 1959.9 ml  Output 1400 ml  Net 559.9 ml    Last 3 Weights 10/12/2020 10/11/2020 10/10/2020  Weight (lbs) 184 lb 11.2 oz 187 lb 14.4 oz 183 lb 1.6 oz  Weight (kg) 83.779 kg 85.231 kg 83.054 kg      Telemetry    Sinus rhythm 90s, with occasional PVCs and bigeminy-  Personally Reviewed  ECG   No new EKG today - Personally Reviewed  Physical Exam   GEN: No acute distress.  Neck: No  JVD Cardiac: RRR, no murmurs, rubs, or gallops.  Respiratory: Clear to auscultation bilaterally. On room air.  GI: Soft, nontender, non-distended  MS: No BLE edema; No deformity. Compression stocking in place.  Neuro:  Alert and oriented x self/place, follow commands appopriately   Psych: Normal affect   Labs    High Sensitivity Troponin:   Recent Labs  Lab 10/08/20 0926 10/08/20 1205  TROPONINIHS 15 17       Chemistry Recent Labs  Lab 10/05/20 2316 10/08/20 0926 10/09/20 0429 10/10/20 0225 10/11/20 0129 10/12/20 0610  NA 136 138 139 139 138 139  K 3.8 4.4 4.2 4.3 4.1 4.3  CL 102 105 107 105 105 104  CO2 25 26 23 26 25 28   GLUCOSE 115* 117* 105* 112* 111* 115*  BUN 30* 41* 34* 27* 26* 24*  CREATININE 1.99* 2.61* 2.20* 2.19* 2.05* 1.97*  CALCIUM 8.5* 8.6* 8.6* 8.7* 8.5* 8.8*  PROT 5.7* 5.9* 5.6*  --   --   --   ALBUMIN 3.3* 3.2* 3.1*  --   --   --   AST 15 21 20   --   --   --   ALT 8 11 9   --   --   --  ALKPHOS 102 90 85  --   --   --   BILITOT 0.7 1.0 0.8  --   --   --   GFRNONAA 32* 23* 29* 29* 31* 33*  ANIONGAP 9 7 9 8 8 7       Hematology Recent Labs  Lab 10/05/20 2316 10/08/20 0926 10/09/20 0429  WBC 8.4 7.0 7.0  RBC 3.59* 3.46* 3.36*  HGB 11.4* 10.9* 10.8*  HCT 36.2* 35.6* 33.8*  MCV 100.8* 102.9* 100.6*  MCH 31.8 31.5 32.1  MCHC 31.5 30.6 32.0  RDW 13.6 13.9 13.6  PLT 143* 150 143*     BNP Recent Labs  Lab 10/08/20 1436  BNP 53.8      DDimer No results for input(s): DDIMER in the last 168 hours.   Radiology    No results found.  Cardiac Studies   Echo from 10/09/2020:   1. Left ventricular ejection fraction, by estimation, is 55 to 60%. The  left ventricle has normal function. Left ventricular endocardial border  not optimally defined to evaluate regional wall motion. There is severe  concentric left ventricular hypertrophy. Indeterminate diastolic filling due to E-A fusion.   2. Right ventricular systolic function is normal.  The right ventricular  size is normal. There is normal pulmonary artery systolic pressure. The  estimated right ventricular systolic pressure is 58.0 mmHg.   3. Left atrial size was severely dilated.   4. The mitral valve is normal in structure. Trivial mitral valve  regurgitation. No evidence of mitral stenosis.   5. The aortic valve is normal in structure. Aortic valve regurgitation is  not visualized. No aortic stenosis is present.   6. Aortic dilatation noted. There is mild dilatation of the aortic root,  measuring 40 mm.   7. The inferior vena cava is normal in size with greater than 50%  respiratory variability, suggesting right atrial pressure of 3 mmHg  Carotid doppler 10/08/2020:  Right Carotid: Velocities in the right ICA are consistent with a 40-59% stenosis. Left Carotid: Velocities in the left ICA are consistent with a 40-59% stenosis. Vertebrals: Bilateral vertebral arteries demonstrate antegrade flow.  ILR device check on 09/23/20:  ILR summary report received. Battery status OK. Normal device function. No new symptom, tachy, brady, or pause episodes. No new AF episodes. Monthly summaryreports and ROV/PRN. RP  Echo from 11/19/2019:  1. Left ventricular ejection fraction, by estimation, is 60 to 65%. The left ventricle has normal function. The left ventricle has no regional wall motion abnormalities. There is moderate concentric left ventricular hypertrophy. Left ventricular diastolic parameters are consistent with Grade II diastolic dysfunction (pseudonormalization). Elevated left atrial pressure. 2. Right ventricular systolic function is normal. The right ventricular size is normal. There is normal pulmonary artery systolic pressure. The estimated right ventricular systolic pressure is 99.8 mmHg. 3. Left atrial size was mildly dilated. 4. The mitral valve is degenerative. No evidence of mitral valve regurgitation. No evidence of mitral stenosis. 5. The aortic valve is  tricuspid. Aortic valve regurgitation is not visualized. No aortic stenosis is present. 6. The inferior vena cava is normal in size with greater than 50% respiratory variability, suggesting right atrial pressure of 3 mmHg.  Comparison(s): No significant change from prior study.  Left heart cath on 07/01/2015:   Prox RCA to Mid RCA lesion, 60% stenosed. The lesion was previously treated with a stent (unknown type). Ost LAD lesion, 70% stenosed. Ost Cx to Prox Cx lesion, 60% stenosed. Prox LAD to Mid LAD lesion, 10%  stenosed. The lesion was previously treated with a stent (unknown type) greater than two years ago. Mid LAD lesion, 99% stenosed. Ost 2nd Diag to 2nd Diag lesion, 70% stenosed. The left ventricular systolic function is normal.    1.  Significant three-vessel coronary artery disease. The culprit for subtotally occluded  Mid LAD which is at the bifurcation of second diagonal which has significant ostial stenosis. There is also ostial LAD stenosis. The proximal LAD stent is patent. There are faint right-to-left collaterals to septal branches.  There is borderline significant disease in the proximal left circumflex as well as borderline significant in-stent restenosis in the right coronary artery.    2. Normal LV systolic function mildly elevated left ventricular end-diastolic pressure.    Recommendations:  The LAD is not favorable for PCI given that it is bifurcation lesion and also there is ostial  LAD stenosis. Thus, I think the patient should be evaluated for CABG  especially that he also has borderline significant disease in the left circumflex and right coronary artery. PCI can be considered if he is deemed to be too high risk for CABG.  Patient Profile     87 y.o. male with PMH of CAD s/p CABG x 04 July 2015, hypertension, orthostatic hypotension, s/p loop record implant, CVA in 2019, TIA 08/2019, bilateral carotid stenosis, AAA, right iliac artery aneurysm, left common iliac  artery aneurysm, GI bleed 06/2018 due to duodenal ulcer, type 2 diabetes, IBS, hyperlipidemia, , mild dementia, CKD stage III, who was transferred from [facility] ER to here for further evaluation of severe back pain, hypertensive urgency, subsequent hypotension.  Cardiology is following for orthostatic hypotension.    Assessment & Plan   Syncope and collapse Orthostatic hypotension Hypertension -Presented to [facility] 10/04/2020 with severe low back pain (recent fall ) and hypertension 213/116, he was admitted under hospital medicine, home medication Florinef was discontinued and he was discharged amlodipine 10mg , hydralazine 25mg  TID,  olmesartan 40 mg.  He returned to the ER at [facility] on 10/08/2020 for unwitnessed syncope episode and fall, found to be hypotensive by EMS with BP 85/56.  -Historically he has hypertension and orthostatic hypotension, sees Dr. Stanford Breed regularly, on amlodipine 5 mg daily and PRN 20 to 40 mg olmesartan for HTN at home, also Florinef. -Work-up here revealed AKI with creatinine 2.61, chronic anemia with hemoglobin 10.9, Hs troponin negative x2, BNP WNL; CT revealed no acute traumatic injury of chest, abdomen, pelvis; unchanged infrarenal abdominal aortic aneurysm, bilateral common iliac artery aneurysm; no ICH or cervical fracture. -Orthostatic vital signs recurrently positive -Syncope episode is likely due to overmedication with antihypertensive, receiving IV fluid hydration, resumed Florinef 0.1 mg daily, discontinued hydralazine 25 TID and amlodipine 5 mg  - Echo unremarkable from 10/09/20 - Orthostasis is unlikely to resolve completely suspect underlying chronic autonomic dysfunction, discussed with wife regarding orthostatic hypotension precaution, agree with compression stocking, consider abdominal binder although patient had refused in the past, move slowly  - Okay to discharge home if stable from orthostatic perspective; likely will continue to have positive  orthostats but goal is for him to have minimal symptoms with SBP>90 with standing   CAD with history of CABG in 2017 -High sensitive troponin negative x2, EKG nonischemic, patient denies any chest pain, event is likely due to orthostatic hypotension not ACS -Continue medical therapy with aspirin 81mg ,  pravastatin 40mg ,  not on BB historically likely due to orthostasis    AKI on CKD stage IIIb -Creatinine baseline 1.5-2 range  over the past year, 2.61 POA -From history, likely due to hypotension/prerenal etiology -Improving with IV fluids, Cr 1.97  today, UOP low 151ml over the past 24 hours -Tolerating PO well, may stop IVF, trend BMP, avoid nephrotoxic agent  Hyperlipidemia -On pravastatin  Type 2 diabetes -A1c 5.6, controlled on diet  AAA - CTA from 10/05/20 showed 4.5 x 4.1 cm infrarenal abdominal aortic aneurysm, bilateral common iliac artery aneurysms and bilateral internal iliac artery aneurysms -Vascular Surgery was consulted during last hospitalization 6/7-6/8, recommend outpatient follow-up  Hypothyroidism GERD Memory loss Acute on Chronic back pain -Managed per primary team   Follow up appointment is made on 11/19/20 with cardiology    For questions or updates, please contact [facility] Please consult [email or web address] for contact info under        Signed, Margie Billet, NP  10/12/2020, 7:33 AM    Patient seen and examined and agree with Margie Billet, NP as detailed above.  In brief, the patient is a 87 y.o. male with PMH of CAD s/p CABG x 04 July 2015, hypertension, chronic orthostatic hypotension, s/p loop record implant, CVA in 2019, TIA 08/2019, bilateral carotid stenosis, AAA, right iliac artery aneurysm, left common iliac artery aneurysm, GI bleed 06/2018 due to duodenal ulcer, type 2 diabetes, IBS, hyperlipidemia, mild dementia, CKD stage III, who was transferred from [facility] ER to here for further evaluation of severe back pain, hypertensive urgency, subsequent  hypotension.  Cardiology is following for orthostatic hypotension.  Patient complains of back pain this morning. Remains very orthostatic while working with PT but suspect this is baseline for him. He is off all antihypertensives and has been resumed on florinef. I hesitate to add midodrine as he has significant hypertension while sitting with history of hypertensive urgency. Overall, I do not think there will be significant improvement in his orthostasis and he will need to continue with orthostatic precautions with compression stockings, slow and assisted position changes, hydration etc. He was recommended for SNF, but I am not sure they are willing to go. No further changes from a CV standpoint.  GEN: Elderly male, sitting in a chair, NAD  Neck: No JVD Cardiac: RRR, no murmurs, rubs, or gallops.  Respiratory: Clear to auscultation bilaterally. GI: Soft, nontender, non-distended  MS: No edema; No deformity. Compression socks in place Neuro:  Nonfocal  Psych: Normal affect    Plan: -Continue to hold all antihypertensives -Continue florinef 0.1mg  daily -Do not want to add midodrine at this time given significant hypertension while laying and recent presentation for hypertensive urgency -Unfortunately, I do not think we will make much further headway with his orthostasis as this is a long standing chronic issues for him; continue conservative measures with compression stockings; slow-assisted position changes, hydration etc. May improve slightly once off pain medications for back pain -Recommended for SNF per PT; patient states he would like to go home -No further changes from CV perspective, will arrange for follow-up with out-patient Cardiologist, Dr. Hebert Soho, MD

## 2020-10-12 NOTE — TOC Initial Note (Addendum)
Transition of Care [facility]) - Initial/Assessment Note    Patient Details  Name: [name] MRN: [id number] Date of Birth: May 17, 1933  Transition of Care [facility]) CM/SW Contact:    Trula Ore, [place] Phone Number: 10/12/2020, 12:53 PM  Clinical Narrative:                  CSW received consult for possible SNF placement at time of discharge. CSW spoke with patients spouse regarding PT recommendation of SNF placement at time of discharge. Patient comes from home with spouse. Patients spouse expressed understanding of PT recommendation and declined SNF placement at time of discharge. Patients spouse confirmed she can provide patient with 24/7 supervision at home. Patients spouse is interested in [facility] services for patient. CSW informed patients spouse that case manager will reach out to her regarding [facility] services.CSW informed case Freight forwarder.  No further questions reported at this time. CSW to continue to follow and assist with discharge planning needs.   Expected Discharge Plan: [place] Barriers to Discharge: Continued Medical Work up   Patient Goals and CMS Choice   CMS Medicare.gov Compare Post Acute Care list provided to:: Patient Represenative (must comment) (Patients spouse Estill Bamberg) Choice offered to / list presented to : Spouse Estill Bamberg)  Expected Discharge Plan and Services Expected Discharge Plan: [place] In-house Referral: Clinical Social Work     Living arrangements for the past 2 months: [place]                                      Prior Living Arrangements/Services Living arrangements for the past 2 months: Single Family Home Lives with:: Self, Spouse Patient language and need for interpreter reviewed:: Yes Do you feel safe going back to the place where you live?: Yes      Need for Family Participation in Patient Care: Yes (Comment) Care giver support system in place?: Yes (comment)   Criminal Activity/Legal  Involvement Pertinent to Current Situation/Hospitalization: No - Comment as needed  Activities of Daily Living Home Assistive Devices/Equipment: Walker (specify type) ADL Screening (condition at time of admission) Patient's cognitive ability adequate to safely complete daily activities?: Yes Is the patient deaf or have difficulty hearing?: No Does the patient have difficulty seeing, even when wearing glasses/contacts?: No Does the patient have difficulty concentrating, remembering, or making decisions?: Yes Patient able to express need for assistance with ADLs?: Yes Does the patient have difficulty dressing or bathing?: Yes Independently performs ADLs?: No Communication: Independent Dressing (OT): Needs assistance Is this a change from baseline?: Pre-admission baseline Grooming: Needs assistance Is this a change from baseline?: Pre-admission baseline Feeding: Independent Bathing: Needs assistance Is this a change from baseline?: Pre-admission baseline Toileting: Needs assistance Is this a change from baseline?: Pre-admission baseline In/Out Bed: Needs assistance Is this a change from baseline?: Pre-admission baseline Walks in Home: Needs assistance Is this a change from baseline?: Pre-admission baseline Does the patient have difficulty walking or climbing stairs?: Yes Weakness of Legs: Both Weakness of Arms/Hands: None  Permission Sought/Granted Permission sought to share information with : Case Manager, Family Supports, Customer service manager                Emotional Assessment       Orientation: : Oriented to Self, Oriented to Place Alcohol / Substance Use: Not Applicable Psych Involvement: No (comment)  Admission diagnosis:  Orthostatic hypotension [I95.1] Syncope and collapse [R55] AKI (acute kidney injury) ([place]) [N17.9] Syncope, unspecified syncope type [R55] Patient Active Problem List   Diagnosis Date Noted   Syncope and collapse 10/08/2020   Acute  renal failure superimposed on stage 3b chronic kidney disease ([place]) 10/08/2020   Memory loss 10/08/2020   Hypothyroid 10/08/2020   Hypertensive urgency 10/05/2020   Situational depression 01/22/2020   Urinary frequency 01/22/2020   Bacteremia due to Staphylococcus epidermidis 11/24/2019   Stroke (cerebrum) (HCC)    Elevated troponin    Closed right hip fracture ([place]) 11/15/2019   Unilateral primary osteoarthritis, right knee 11/13/2019   At high risk for falls 05/29/2019   Chronic bilateral low back pain without sciatica 02/07/2019   Statin intolerance 11/16/2018   Hyperlipidemia, mixed 08/15/2018   Low back pain 07/18/2018   History of lacunar cerebrovascular accident (CVA) 02/12/2018   Dizziness 02/12/2018   Myofascial pain 12/18/2017   Spondylosis without myelopathy or radiculopathy, lumbar region 12/18/2017   Lumbar radiculopathy, right 12/18/2017   AKI (acute kidney injury) ([place])    Cholelithiasis 05/17/2017   Sinus Bradycardia 05/17/2017   Thoracic radiculopathy 04/12/2017   Primary osteoarthritis of right knee 12/14/2016   DDD (degenerative disc disease), lumbar 10/10/2016   Lumbar facet arthropathy 08/29/2016   Idiopathic scoliosis 03/22/2016   Diabetic neuropathy ([place]) 03/22/2016   Dysautonomia orthostatic hypotension syndrome 02/25/2016   CKD stage 3 due to type 2 diabetes mellitus ([place]) 02/19/2016   Coronary artery disease involving coronary bypass graft of native heart without angina pectoris    Diabetes mellitus type 2, diet-controlled ([place])    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 [place] [address] Phone: [phone] Fax: [phone]     Social Determinants of Health (SDOH) Interventions    Readmission Risk Interventions No flowsheet data found.

## 2020-10-14 ENCOUNTER — Ambulatory Visit (INDEPENDENT_AMBULATORY_CARE_PROVIDER_SITE_OTHER): Payer: Medicare Other | Admitting: Internal Medicine

## 2020-10-14 ENCOUNTER — Other Ambulatory Visit: Payer: Self-pay

## 2020-10-14 ENCOUNTER — Encounter: Payer: Self-pay | Admitting: Internal Medicine

## 2020-10-14 VITALS — BP 136/80 | HR 85 | Temp 97.5°F | Resp 17 | Ht 72.0 in | Wt 185.6 lb

## 2020-10-14 DIAGNOSIS — I714 Abdominal aortic aneurysm, without rupture, unspecified: Secondary | ICD-10-CM

## 2020-10-14 DIAGNOSIS — E1169 Type 2 diabetes mellitus with other specified complication: Secondary | ICD-10-CM

## 2020-10-14 DIAGNOSIS — Z79899 Other long term (current) drug therapy: Secondary | ICD-10-CM

## 2020-10-14 DIAGNOSIS — E785 Hyperlipidemia, unspecified: Secondary | ICD-10-CM

## 2020-10-14 DIAGNOSIS — E039 Hypothyroidism, unspecified: Secondary | ICD-10-CM

## 2020-10-14 DIAGNOSIS — E0822 Diabetes mellitus due to underlying condition with diabetic chronic kidney disease: Secondary | ICD-10-CM

## 2020-10-14 DIAGNOSIS — I951 Orthostatic hypotension: Secondary | ICD-10-CM | POA: Diagnosis not present

## 2020-10-14 DIAGNOSIS — N1832 Chronic kidney disease, stage 3b: Secondary | ICD-10-CM

## 2020-10-14 DIAGNOSIS — I25810 Atherosclerosis of coronary artery bypass graft(s) without angina pectoris: Secondary | ICD-10-CM | POA: Diagnosis not present

## 2020-10-14 DIAGNOSIS — N179 Acute kidney failure, unspecified: Secondary | ICD-10-CM

## 2020-10-14 DIAGNOSIS — R0989 Other specified symptoms and signs involving the circulatory and respiratory systems: Secondary | ICD-10-CM

## 2020-10-14 LAB — COMPLETE METABOLIC PANEL WITH GFR
AG Ratio: 1.6 (calc) (ref 1.0–2.5)
ALT: 8 U/L — ABNORMAL LOW (ref 9–46)
AST: 15 U/L (ref 10–35)
Albumin: 4 g/dL (ref 3.6–5.1)
Alkaline phosphatase (APISO): 111 U/L (ref 35–144)
BUN/Creatinine Ratio: 15 (calc) (ref 6–22)
BUN: 33 mg/dL — ABNORMAL HIGH (ref 7–25)
CO2: 28 mmol/L (ref 20–32)
Calcium: 9.4 mg/dL (ref 8.6–10.3)
Chloride: 103 mmol/L (ref 98–110)
Creat: 2.16 mg/dL — ABNORMAL HIGH (ref 0.70–1.11)
GFR, Est African American: 31 mL/min/{1.73_m2} — ABNORMAL LOW (ref >=60)
GFR, Est Non African American: 27 mL/min/{1.73_m2} — ABNORMAL LOW (ref >=60)
Globulin: 2.5 g/dL (calc) (ref 1.9–3.7)
Glucose, Bld: 120 mg/dL — ABNORMAL HIGH (ref 65–99)
Potassium: 4.9 mmol/L (ref 3.5–5.3)
Sodium: 141 mmol/L (ref 135–146)
Total Bilirubin: 0.6 mg/dL (ref 0.2–1.2)
Total Protein: 6.5 g/dL (ref 6.1–8.1)

## 2020-10-14 LAB — CBC WITH DIFFERENTIAL/PLATELET
Absolute Monocytes: 669 cells/uL (ref 200–950)
Basophils Absolute: 68 cells/uL (ref 0–200)
Basophils Relative: 0.7 %
Eosinophils Absolute: 543 cells/uL — ABNORMAL HIGH (ref 15–500)
Eosinophils Relative: 5.6 %
HCT: 37.3 % — ABNORMAL LOW (ref 38.5–50.0)
Hemoglobin: 12.2 g/dL — ABNORMAL LOW (ref 13.2–17.1)
Lymphs Abs: 2056 cells/uL (ref 850–3900)
MCH: 31.9 pg (ref 27.0–33.0)
MCHC: 32.7 g/dL (ref 32.0–36.0)
MCV: 97.4 fL (ref 80.0–100.0)
MPV: 9.8 fL (ref 7.5–12.5)
Monocytes Relative: 6.9 %
Neutro Abs: 6363 cells/uL (ref 1500–7800)
Neutrophils Relative %: 65.6 %
Platelets: 218 10*3/uL (ref 140–400)
RBC: 3.83 10*6/uL — ABNORMAL LOW (ref 4.20–5.80)
RDW: 12.9 % (ref 11.0–15.0)
Total Lymphocyte: 21.2 %
WBC: 9.7 10*3/uL (ref 3.8–10.8)

## 2020-10-14 MED ORDER — MIDODRINE HCL 5 MG PO TABS: ORAL_TABLET | ORAL | 0 refills | Status: DC

## 2020-10-14 NOTE — Patient Instructions (Addendum)
Orthostatic Hypotension Blood pressure is a measurement of how strongly, or weakly, your blood is pressing against the walls of your arteries. Orthostatic hypotension is a sudden drop in blood pressure that happens when you quickly change positions,such as when you get up from sitting or lying down. Arteries are blood vessels that carry blood from your heart throughout your body. When blood pressure is too low, you may not get enough blood to your brain or to the rest of your organs. This can cause weakness, light-headedness, rapid heartbeat, and fainting. This can last for just a few seconds or for up to a few minutes. Orthostatic hypotension is usually not a serious problem. However, if it happens frequently or gets worse, it may be a sign of somethingmore serious. What are the causes? This condition may be caused by: Sudden changes in posture, such as standing up quickly after you have been sitting or lying down. Blood loss. Loss of body fluids (dehydration). Heart problems. Hormone (endocrine) problems. Pregnancy. Severe infection. Lack of certain nutrients. Severe allergic reactions (anaphylaxis). Certain medicines, such as blood pressure medicine or medicines that make the body lose excess fluids (diuretics). Sometimes, this condition can be caused by not taking medicine as directed, such as taking too much of a certain medicine. What increases the risk? The following factors may make you more likely to develop this condition: Age. Risk increases as you get older. Conditions that affect the heart or the central nervous system. Taking certain medicines, such as blood pressure medicine or diuretics. Being pregnant. What are the signs or symptoms? Symptoms of this condition may include: Weakness. Light-headedness. Dizziness. Blurred vision. Fatigue. Rapid heartbeat. Fainting, in severe cases. How is this diagnosed? This condition is diagnosed based on: Your medical history. Your  symptoms. Your blood pressure measurement. Your health care provider will check your blood pressure when you are: Lying down. Sitting. Standing. A blood pressure reading is recorded as two numbers, such as "120 over 80" (or 120/80). The first ("top") number is called the systolic pressure. It is a measure of the pressure in your arteries as your heart beats. The second ("bottom") number is called the diastolic pressure. It is a measure of the pressure in your arteries when your heart relaxes between beats. Blood pressure is measured in a unit called mm Hg. Healthy blood pressure for most adults is 120/80. If your blood pressure is below 90/60, you may be diagnosed withhypotension. Other information or tests that may be used to diagnose orthostatic hypotension include: Your other vital signs, such as your heart rate and temperature. Blood tests. Tilt table test. For this test, you will be safely secured to a table that moves you from a lying position to an upright position. Your heart rhythm and blood pressure will be monitored during the test. How is this treated? This condition may be treated by: Changing your diet. This may involve eating more salt (sodium) or drinking more water. Taking medicines to raise your blood pressure. Changing the dosage of certain medicines you are taking that might be lowering your blood pressure. Wearing compression stockings. These stockings help to prevent blood clots and reduce swelling in your legs. In some cases, you may need to go to the hospital for: Fluid replacement. This means you will receive fluids through an IV. Blood replacement. This means you will receive donated blood through an IV (transfusion). Treating an infection or heart problems, if this applies. Monitoring. You may need to be monitored while medicines that you   are taking wear off. Follow these instructions at home: Eating and drinkin Drink enough fluid to keep your urine pale yellow. Eat  a healthy diet, and follow instructions from your health care provider about eating or drinking restrictions. A healthy diet includes: Fresh fruits and vegetables. Whole grains. Lean meats. Low-fat dairy products. Eat extra salt only as directed. Do not add extra salt to your diet unless your health care provider told you to do that. Eat frequent, small meals. Avoid standing up suddenly after eating.  Medicines Take over-the-counter and prescription medicines only as told by your health care provider. Follow instructions from your health care provider about changing the dosage of your current medicines, if this applies. Do not stop or adjust any of your medicines on your own. General instructions Wear compression stockings as told by your health care provider. Get up slowly from lying down or sitting positions. This gives your blood pressure a chance to adjust. Avoid hot showers and excessive heat as directed by your health care provider. Return to your normal activities as told by your health care provider. Ask your health care provider what activities are safe for you. Do not use any products that contain nicotine or tobacco, such as cigarettes, e-cigarettes, and chewing tobacco. If you need help quitting, ask your health care provider. Keep all follow-up visits as told by your health care provider. This is important.  Contact a health care provider if you: Vomit. Have diarrhea. Have a fever for more than 2-3 days. Feel more thirsty than usual. Feel weak and tired. Get help right away if you: Have chest pain. Have a fast or irregular heartbeat. Develop numbness in any part of your body. Cannot move your arms or your legs. Have trouble speaking. Become sweaty or feel light-headed. Faint. Feel short of breath. Have trouble staying awake. Feel confused. Summary Orthostatic hypotension is a sudden drop in blood pressure that happens when you quickly change positions. Orthostatic  hypotension is usually not a serious problem. It is diagnosed by having your blood pressure taken lying down, sitting, and then standing. It may be treated by changing your diet or adjusting your medicines. ================================= Syncope  Syncope refers to a condition in which a person temporarily loses consciousness. Syncope may also be called fainting or passing out. It is caused by a sudden decrease in blood flow to the brain. Even though most causes of syncope are not dangerous, syncope can be a sign of a serious medical problem. Your health care provider may do tests to find the reason why you are havingsyncope. Signs that you may be about to faint include: Feeling dizzy or light-headed. Feeling nauseous. Seeing all white or all black in your field of vision. Having cold, clammy skin. If you faint, get medical help right away. Call your local emergency services (911 in the [place] Do not drive yourself to the hospital. Follow these instructions at home: Pay attention to any changes in your symptoms. Take these actions to stay safeand to help relieve your symptoms: Lifestyle Do not drive, use machinery, or play sports until your health care provider says it is okay. Do not drink alcohol. Do not use any products that contain nicotine or tobacco, such as cigarettes and e-cigarettes. If you need help quitting, ask your health care provider. Drink enough fluid to keep your urine pale yellow. General instructions Take over-the-counter and prescription medicines only as told by your health care provider. If you are taking blood pressure or heart medicine, get  up slowly and take several minutes to sit and then stand. This can reduce dizziness or light-headedness. Have someone stay with you until you feel stable. If you start to feel like you might faint, lie down right away and raise (elevate) your feet above the level of your heart. Breathe deeply and steadily. Wait until all the  symptoms have passed. Keep all follow-up visits as told by your health care provider. This is important. Get help right away if you: Have a severe headache. Faint once or repeatedly. Have pain in your chest, abdomen, or back. Have a very fast or irregular heartbeat (palpitations). Have pain when you breathe. Are bleeding from your mouth or rectum, or you have black or tarry stool. Have a seizure. Are confused. Have trouble walking. Have severe weakness. Have vision problems. These symptoms may represent a serious problem that is an emergency. Do not wait to see if your symptoms will go away. Get medical help right away. Call your local emergency services (911 in the [place] Do not drive yourself to the hospital. Summary Syncope refers to a condition in which a person temporarily loses consciousness. It is caused by a sudden decrease in blood flow to the brain. Signs that you may be about to faint include dizziness, feeling light-headed, feeling nauseous, sudden vision changes, or cold, clammy skin. Although most causes of syncope are not dangerous, syncope can be a sign of a serious medical

## 2020-10-14 NOTE — Progress Notes (Signed)
Future Appointments  Date Time Provider [place]  10/14/2020 10:00 AM [name], MD GAAM-[facility] None  10/22/2020  9:00 AM Magnus Sinning, MD OC-[facility] None  11/19/2020  3:15 PM Darreld Mclean, PA-C [facility]  12/30/2020 11:00 AM [name], MD [facility]  03/31/2021  1:00 PM Garvin Fila, MD GNA-GNA None  06/09/2021 11:30 AM Garnet Sierras, NP [facility]AM[facility] None  10/14/2021 10:00 AM [name], MD [facility] None    [place]     This very nice 87 y.o. MWM  was admitted to the hospital on  10/08/2020  and patient was discharged from the hospital on   10/12/2020 . The patient now presents for follow up for transition from recent hospitalization   The day after discharge  our clinical staff contacted the patient to assure stability and schedule a follow up appointment. The discharge summary, medications and diagnostic test results were reviewed before meeting with the patient. 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 Patient has antecedent hx/o of very labile postural hypotension complicated by supine hypertension. He had extensive negative Xray evaluation to r/o Fx after a fall and had a negative CT angiography of the chest & abdomen /pelvis to r/o dissecting aneurysm. BP was felt stabilized and he was d/c'd  - stopping his Florinef and adding Amlodipine&  Hydralazine, Only to be readmitted 10/08/2020 thru 10/12/2020 after a fall with LOC and postural Hypotension.   Amlodipine, hydralazine, tamsulosin were d/c'd due to orthostatic hypotension & he was restarted on Florinef & given IVF  Hospitalization discharge instructions and medications are reconciled with the patient & caregiver wife.   Patient is also followed with labile Hypertension /Orthostatic Hypotension, Hyperlipidemia, Pre-Diabetes and Vitamin D Deficiency.       Patient is treated for HTN & BP has been controlled at home. Today's BP is sitting - 136/80. Patient has had no complaints of any cardiac type chest pain, palpitations, dyspnea Vertell Limber /PND, dizziness, claudication, or dependent edema.     Hyperlipidemia is controlled with diet & meds. Patient denies myalgias or other med SE's. Last Lipids were at goal:  Lab Results  Component Value Date   CHOL 131 03/05/2020   HDL 36 (L) 03/05/2020   LDLCALC 77 03/05/2020   TRIG 94 03/05/2020   CHOLHDL 3.6 03/05/2020      Also, the patient has history of T2_NIDDM PreDiabetes and has had no symptoms of reactive hypoglycemia, diabetic polys, paresthesias or visual blurring.  Last A1c was normal & at goal:  Lab Results  Component Value Date   HGBA1C 5.6 10/05/2020      Further, the patient also has history of Vitamin D Deficiency and supplements vitamin D without any suspected side-effects. Current  vitamin D level was  elevated & dose decreased:  Lab Results  Component Value Date   VD25OH 117.59 (H) 10/08/2020   Current Outpatient Medications on File Prior to Visit  Medication Sig   acetaminophen (TYLENOL) 500 MG tablet Take 1,000 mg by mouth in the morning and at bedtime.   aspirin EC 81 MG tablet Take 1 tablet (81 mg total) by mouth daily.   buPROPion (WELLBUTRIN XL) 150 MG 24 hr tablet Take 1 tablet (150 mg total) by mouth daily.   butalbital-acetaminophen-caffeine (FIORICET) 50-325-40 MG tablet Take 1 tablet by mouth every  4 (four) hours as needed for headache.   Calcium Carbonate-Vit D-Min (CALCIUM 1200 PO) Take 1 tablet by mouth daily.   Cholecalciferol (VITAMIN D-3) 5000 units TABS Take 5,000 Units by mouth daily.   Cyanocobalamin (VITAMIN B-12) 3000 MCG SUBL Place 1 tablet under the tongue daily.   ferrous sulfate 325 (65 FE) MG tablet Take 325 mg by mouth daily with breakfast.   fludrocortisone (FLORINEF) 0.1 MG tablet Take 1 tablet (0.1 mg total) by mouth daily.   glucose blood test strip 1 each by Other route as needed. Check blood sugar 1 time daily-[id number]   levothyroxine (SYNTHROID) 25 MCG tablet Take 1 tablet (25 mcg total) by mouth daily before breakfast.   meloxicam (MOBIC) 15 MG tablet Take 0.5-1 tablets (7.5-15 mg total) by mouth daily.   memantine (NAMENDA) 10 MG tablet Take 1 tablet (10 mg total) by mouth 2 (two) times daily.   Multiple Vitamin (MULTIVITAMIN WITH MINERALS) TABS tablet Take 1 tablet by mouth daily.   olmesartan (BENICAR) 40 MG tablet Take 20-40 mg by mouth daily as needed (high blood pressure).   pantoprazole (PROTONIX) 40 MG tablet Take 1 tablet (40 mg total) by mouth daily.   Potassium 99 MG TABS Take 1 tablet by mouth daily.   pravastatin (PRAVACHOL) 40 MG tablet Take 1 tablet (40 mg total) by mouth daily.   Zinc 50 MG TABS Take 1 tablet by mouth daily.   No current facility-administered medications on file prior to visit.   Allergies  Allergen Reactions   Other Other (See Comments)    Pt reports allergic to a medication but unsure of name or type of med   Cymbalta [Duloxetine Hcl] Other (See Comments)    Dizziness, hallucinations.    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 s/p  CABGx4 in 07/2015.   Carotid artery disease ([place])    Colon polyps    Diabetes ([place])    Difficult intubation    Duodenal ulcer disease 08/15/2018   Esophageal reflux    High cholesterol    History of IBS 02/27/2009   Hypertension    pt denies, he says he has a h/o hypotension. If BP up he adjusts the Florinef   Jejunitis with partial SBO 05/20/2017   Orthostatic hypotension    Prostatitis    Stroke [facility])    TIA (transient ischemic attack)    Upper GI bleed 06/24/2018   Vitamin B 12 deficiency    Vitamin D deficiency    Immunization History  Administered Date(s) Administered   Influenza, High Dose  12/30/2013, 02/02/2015, 01/27/2016, 01/29/2017, 01/31/2018, 02/20/2019, 03/05/2020   Influenza 01/13/2013, 01/29/2017, 01/30/2018   PFIZER  SARS-COV-2 Vacc 05/21/2019, 06/11/2019, 02/15/2020   Pneumococcal -13 02/14/2014   Pneumococcal- 23  06/15/2004   Tetanus 10/01/2012   Zoster, Live 10/01/2012   Past Surgical History:  Procedure Laterality Date   BIOPSY  06/25/2018   Procedure: BIOPSY;  Surgeon: Rush Landmark Telford Nab., MD;  Location: [place];  Service: Gastroenterology;;   BIOPSY  04/24/2019   Procedure: BIOPSY;  Surgeon: Irving Copas., MD;  Location: [facility];  Service: Gastroenterology;;   BLADDER SURGERY  1969   traumatic pelvic fractures, urethral and bladder repair   CARDIAC CATHETERIZATION N/A 07/01/2015   Procedure: Left Heart Cath and Coronary Angiography;  Surgeon: Wellington Hampshire, MD;  Location: [place] [facility];  Service: Cardiovascular;  Laterality: N/A;   COLON RESECTION N/A 05/17/2017   Procedure: DIAGNOSTIC LAPAROSCOPY,;  Surgeon: Leighton Ruff, MD;  Location: [facility];  Service: General;  Laterality: N/A;   COLONOSCOPY WITH PROPOFOL N/A 04/24/2019   Procedure: COLONOSCOPY WITH PROPOFOL;  Surgeon: Irving Copas., MD;  Location: [facility];  Service: Gastroenterology;  Laterality: N/A;   CORONARY ANGIOPLASTY WITH STENT PLACEMENT      CORONARY ARTERY BYPASS GRAFT N/A 07/06/2015   Procedure: CORONARY ARTERY BYPASS GRAFTING (CABG)x 4   utilizing the left internal mammary artery and endoscopically harvested bilateral  sapheneous vein.;  Surgeon: Ivin Poot, MD;  Location: [place];  Service: Open Heart Surgery;  Laterality: N/A;   ESOPHAGEAL DILATION  04/24/2019   Procedure: ESOPHAGEAL DILATION;  Surgeon: Rush Landmark Telford Nab., MD;  Location[facility];  Service: Gastroenterology;;   ESOPHAGOGASTRODUODENOSCOPY (EGD) WITH PROPOFOL N/A 06/25/2018   Procedure: ESOPHAGOGASTRODUODENOSCOPY (EGD) WITH PROPOFOL;  Surgeon: Irving Copas., MD;  Location: [place];  Service: Gastroenterology;  Laterality: N/A;   ESOPHAGOGASTRODUODENOSCOPY (EGD) WITH PROPOFOL N/A 04/24/2019   Procedure: ESOPHAGOGASTRODUODENOSCOPY (EGD) WITH PROPOFOL;  Surgeon: Rush Landmark Telford Nab., MD;  Location: [facility];  Service: Gastroenterology;  Laterality: N/A;   FEMUR IM NAIL Right 11/16/2019   Procedure: RIGHT HIP INTRAMEDULLARY (IM) NAIL FEMORAL;  Surgeon: Meredith Pel, MD;  Location: [place];  Service: Orthopedics;  Laterality: Right;   HEMOSTASIS CLIP PLACEMENT  04/24/2019   Procedure: HEMOSTASIS CLIP PLACEMENT;  Surgeon: Irving Copas., MD;  Location: [facility];  Service: Gastroenterology;;   KNEE SURGERY     LOOP RECORDER INSERTION N/A 02/19/2018   Procedure: LOOP RECORDER INSERTION;  Surgeon: Deboraha Sprang, MD;  Location: [place] [facility];  Service: Cardiovascular;  Laterality: N/A;   POLYPECTOMY  04/24/2019   Procedure: POLYPECTOMY;  Surgeon: Rush Landmark Telford Nab., MD;  Location: [facility];  Service: Gastroenterology;;   SUBMUCOSAL LIFTING INJECTION  04/24/2019   Procedure: SUBMUCOSAL LIFTING INJECTION;  Surgeon: Irving Copas., MD;  [facility];  Service: Gastroenterology;;   TEE WITHOUT CARDIOVERSION N/A 07/06/2015   Procedure: TRANSESOPHAGEAL ECHOCARDIOGRAM (TEE);  Surgeon: Ivin Poot,  MD;  Location: [place];  Service: Open Heart Surgery;  Laterality: N/A;   FHx:    Reviewed / unchanged  SHx:    Reviewed / unchanged  Systems Review:  Constitutional: Denies fever, chills, wt changes, headaches, insomnia, fatigue, night sweats, change in appetite. Eyes: Denies redness, blurred vision, diplopia, discharge, itchy, watery eyes.  ENT: Denies discharge, congestion, post nasal drip, epistaxis, sore throat, earache, hearing loss, dental pain, tinnitus, vertigo, sinus pain, snoring.  CV: Denies chest pain, palpitations, irregular heartbeat, syncope, dyspnea, diaphoresis, orthopnea, PND, claudication or edema. Respiratory: denies cough, dyspnea, DOE, pleurisy, hoarseness, laryngitis, wheezing.  Gastrointestinal: Denies dysphagia, odynophagia, heartburn, reflux, water brash, abdominal pain or cramps, nausea, vomiting, bloating, diarrhea, constipation, hematemesis, melena, hematochezia  or hemorrhoids. Genitourinary: Denies dysuria, frequency, urgency, nocturia, hesitancy, discharge, hematuria or flank pain. Musculoskeletal: Denies arthralgias, myalgias, stiffness, jt. swelling, pain, limping or strain/sprain.  Skin: Denies pruritus, rash, hives, warts, acne, eczema or change in skin lesion(s). Neuro: No weakness, tremor, incoordination, spasms, paresthesia or pain. Psychiatric: Denies confusion, memory loss or sensory loss. Endo: Denies change in weight, skin or hair change.  Heme/Lymph: No excessive bleeding, bruising or enlarged lymph nodes.  Physical Exam  BP 136/80   Pulse 85   Temp (!) 97.5 F (36.4 C)   Resp 17   Ht 6' (1.829 m)   Wt 185 lb 9.6 oz (84.2 kg)   SpO2 98%   BMI 25.17 kg/m   Appears well nourished, well groomed  and in no distress.  Eyes: PERRLA, EOMs, conjunctiva no swelling or erythema. Sinuses: No frontal/maxillary tenderness ENT/Mouth: EAC's clear, TM's nl w/o erythema, bulging. Nares clear w/o erythema, swelling, exudates. Oropharynx clear without  erythema or exudates. Oral hygiene is good. Tongue normal, non obstructing. Hearing intact.  Neck: Supple. Thyroid nl. Car 2+/2+ without bruits, nodes or JVD. Chest: Respirations nl with BS clear & equal w/o rales, rhonchi, wheezing or stridor.  Cor: Heart sounds normal w/ regular rate and rhythm without sig. murmurs, gallops, clicks or rubs. Peripheral pulses normal and equal  without edema.  Abdomen: Soft & bowel sounds normal. Non-tender w/o guarding, rebound, hernias, masses or organomegaly.  Lymphatics: Unremarkable.  Musculoskeletal: Full ROM all peripheral extremities, joint stability, 5/5 strength. Patient in wheelchair   Skin: Warm, dry without exposed rashes, lesions or ecchymosis apparent.  Neuro: Cranial nerves intact, reflexes equal bilaterally. Sensory-motor testing grossly intact. Tendon reflexes grossly intact.  Pysch: Alert & oriented x 3.  Insight and judgement nl & appropriate. No ideations.  Assessment and Plan:  1. Orthostatic syncope  - Continue medication, monitor blood pressure at home.  - Continue DASH diet.  Reminder to go to the ER if any CP,  SOB, nausea, dizziness, severe HA, changes vision/speech.  - CBC with Differential/Platelet - COMPLETE METABOLIC PANEL WITH GFR  2. Diabetes mellitus due to underlying condition with stage 3b  chronic kidney disease, without long-term current use of insulin ([place])   3. Coronary artery disease involving coronary bypass  graft of native heart without angina pectoris   4. Dysautonomia orthostatic hypotension syndrome  - COMPLETE METABOLIC PANEL WITH GFR  5. Abdominal aortic aneurysm (AAA) without rupture ([facility])   6. Labile hypertension  - COMPLETE METABOLIC PANEL WITH GFR  - Continue diet, exercise, lifestyle modifications.  - Monitor appropriate labs. - Continue supplementation.   7. AKI (acute kidney injury) ([place])  - COMPLETE METABOLIC PANEL WITH GFR  8. Hypothyroidism, unspecified type   9.  Hyperlipidemia associated with type 2 diabetes mellitus ([place])  - Continue diet/meds, exercise,& lifestyle modifications.  - Continue monitor periodic cholesterol/liver & renal functions   10. Medication management  - CBC with Differential/Platelet - COMPLETE METABOLIC PANEL WITH GFR       Discussed  regular exercise, BP monitoring, weight control to achieve/maintain BMI less than 25 and discussed meds and SE's. Recommended labs to assess and monitor clinical status with further disposition pending results of labs. Over 30 minutes of exam, counseling, chart review was performed.   Kirtland Bouchard, MD

## 2020-10-15 NOTE — Progress Notes (Signed)
============================================================ -   Test results slightly outside the reference range are not unusual. If there is anything important, I will review this with you,  otherwise it is considered normal test values.  If you have further questions,  please do not hesitate to contact me at the office or via My Chart.  ============================================================ ============================================================  - CBC much better - Red cell count = Hgb is better                                                                    - up from 10.8 to 12.2 gm% - Great !   ============================================================ ============================================================  - Kidney functions appear about the same.  ============================================================ ============================================================

## 2020-10-20 NOTE — Progress Notes (Signed)
Carelink Summary Report / Loop Recorder 

## 2020-10-22 ENCOUNTER — Ambulatory Visit: Payer: Medicare Other | Admitting: Physical Medicine and Rehabilitation

## 2020-10-22 ENCOUNTER — Encounter: Payer: Self-pay | Admitting: Physical Medicine and Rehabilitation

## 2020-10-22 ENCOUNTER — Other Ambulatory Visit: Payer: Self-pay

## 2020-10-22 VITALS — BP 96/62 | HR 89

## 2020-10-22 DIAGNOSIS — S29019A Strain of muscle and tendon of unspecified wall of thorax, initial encounter: Secondary | ICD-10-CM

## 2020-10-22 DIAGNOSIS — M549 Dorsalgia, unspecified: Secondary | ICD-10-CM

## 2020-10-22 DIAGNOSIS — M7918 Myalgia, other site: Secondary | ICD-10-CM

## 2020-10-22 MED ORDER — PREDNISONE 50 MG PO TABS: ORAL_TABLET | ORAL | 0 refills | Status: DC

## 2020-10-22 NOTE — Progress Notes (Signed)
[name] - 87 y.o. male MRN [id number]  Date of birth: 14-Aug-1933  Office Visit Note: Visit Date: 10/22/2020 PCP: [name], MD Referred by: [name], MD  Subjective: Chief Complaint  Patient presents with   Middle Back - Pain   Right Shoulder - Pain   HPI: [name] is a 87 y.o. male who comes in today At the request of Dr. Basil Dess for planned right L5 and S1 transforaminal epidural steroid injection.  Patient has had new MRI since I have seen him last and I did review that.  No high-grade nerve compression.  Dr. Louanne Skye detail that the patient had some right foot drop.  Today the patient comes in in wheelchair with his wife who provides almost all the history.  They report recent fall with orthostatic hypotension condition that is believed to be autonomic disorder.  He was seen in the emergency department with renal insult as well as contusion.  This was evaluated at that time.  He also had follow-up with his primary care physician Dr. Melford Aase.  To me today the patient does not report any right hip or leg pain at all.  He states all of his pain is in the right thoracic area pretty much at the shoulder blade to mid back.  He reports the pain is constant and worse with movement.  He actually states that its average 6 out of 10 but can be 10 out of 10 despite any kind of medication treatment.  He is taking Tylenol.  Again when asking him he denies any right hip or thigh pain or leg pain.  By way of review we have seen the patient in the past for his lumbar spine really without any relief from any interventional spine procedure today and as far as I can remember.  He denies any left-sided low back pain or leg pain.  He has had no other new issues involved other than this right scapular and thoracic type pain worse with movement.  Review of Systems  Musculoskeletal:  Positive for back pain and joint pain.  All other systems reviewed and are negative. Otherwise per  HPI.  Assessment & Plan: Visit Diagnoses:    ICD-10-CM   1. Upper back pain  M54.9     2. Thoracic myofascial strain, initial encounter  S29.019A     3. Myofascial pain syndrome  M79.18        Plan: Findings:  Recent falls do to autonomic orthostatic hypotension with a history of chronic pain syndrome.  Patient coming in today for planned epidural injection but denies any pain in the right hip or leg.  My exam is interesting in that the patient does not have foot drop on the right at least to my exam.  He is somewhat difficult to get him to follow instructions but when he does on the right foot I do not get any EHL or dorsiflexion weakness on the right.  He has some pain over the trochanters bilaterally but he has tender points really all over.  Examination of the thoracic spine shows painful trigger points in the rhomboids and paraspinal taut bands that do reproduce his pain.  He has no pain with rocking over the vertebral bodies.  At this point I think this is more of a sprain strain type of activity mixed with chronic pain syndrome unfortunately.  The patient will continue with Tylenol and I am going to give the patient a few days of prednisone to  see if that helps with his upper back pain and he will follow-up with Dr. Louanne Skye if needed for his right hip and leg.  I did review all MRI and imaging.   Meds & Orders:  Meds ordered this encounter  Medications   predniSONE (DELTASONE) 50 MG tablet    Sig: Take 1 tablet daily with food for 5 days until finished    Dispense:  3 tablet    Refill:  0   No orders of the defined types were placed in this encounter.   Follow-up: Return for Basil Dess, MD.   Procedures: No procedures performed      Clinical History: MRI LUMBAR SPINE WITHOUT AND WITH CONTRAST   TECHNIQUE: Multiplanar and multiecho pulse sequences of the lumbar spine were obtained without and with intravenous contrast.   CONTRAST:  68mL GADAVIST GADOBUTROL 1 MMOL/ML IV SOLN    COMPARISON:  03/22/2020   FINDINGS: Segmentation:  Standard.   Alignment: Dextrocurvature. Similar anteroposterior alignment with minor listhesis, for example retrolisthesis at L4-L5.   Vertebrae: Similar vertebral body heights with degenerative endplate irregularity and chronic appearing degenerative marrow changes. No marrow edema. No suspicious osseous lesion.   Conus medullaris and cauda equina: Conus extends to the L1-L2 level. Conus and cauda equina appear normal. No abnormal intrathecal enhancement.   Paraspinal and other soft tissues: Aneurysmal dilatation of the abdominal aorta and common iliacs. The aorta measures up to 4.4 cm above the bifurcation. These findings are discussed on recent CT imaging with follow-up recommendation provided.   Disc levels:   L1-L2: Small right foraminal protrusion. No canal or foraminal stenosis. Appearance is similar.   L2-L3: Disc bulge slightly eccentric to the right. No canal or foraminal stenosis. Appearance is similar.   L3-L4: Disc bulge with endplate osteophytic ridging eccentric to the left. Punctate central annular fissure. No canal stenosis. Slight effacement of the left subarticular recess. Minor right and moderate left foraminal stenosis. Appearance is similar.   L4-L5: Disc bulge with endplate osteophytic ridging eccentric to the left. Facet arthropathy with ligamentum flavum infolding. Slightly increased mild canal stenosis. Partial effacement of the subarticular recesses. Moderate right and moderate to marked left foraminal stenosis. Appearance is similar.   L5-S1: Disc bulge with endplate osteophytic ridging slightly eccentric to the right. Facet arthropathy. No canal stenosis. Slight effacement of the subarticular recesses. Moderate right and mild left foraminal stenosis. Appearance is similar.   IMPRESSION: No new compression deformity.   Multilevel degenerative changes as detailed above are overall similar  in appearance to the prior study other than slightly increased mild canal stenosis at L4-L5.     Electronically Signed   By: Macy Mis M.D.   On: 10/06/2020 15:58   He reports that he has quit smoking. He quit smokeless tobacco use about 33 years ago.  His smokeless tobacco use included chew.  Recent Labs    11/19/19 0908 03/05/20 1046 10/05/20 2316  HGBA1C 5.1 5.5 5.6    Objective:  VS:  HT:    WT:   BMI:     BP:96/62  HR:89bpm  TEMP: ( )  RESP:  Physical Exam Vitals and nursing note reviewed.  Constitutional:      General: He is not in acute distress.    Appearance: He is well-developed. He is ill-appearing.  HENT:     Head: Normocephalic and atraumatic.     Right Ear: External ear normal.     Left Ear: External ear normal.  Eyes:  Conjunctiva/sclera: Conjunctivae normal.     Pupils: Pupils are equal, round, and reactive to light.  Cardiovascular:     Rate and Rhythm: Normal rate.     Pulses: Normal pulses.     Heart sounds: Normal heart sounds.  Pulmonary:     Effort: Pulmonary effort is normal. No respiratory distress.  Musculoskeletal:     Cervical back: Normal range of motion and neck supple. No rigidity.     Right lower leg: Edema present.     Left lower leg: Edema present.     Comments: Examination of his thoracic spine shows focal trigger points in the right more than left rhomboids as well as infraspinatus and taut bands in the paraspinal musculature.  He has no pain over rocking of the thoracic vertebral bodies.  He has tenderness over the greater trochanters bilaterally.  Examination of his legs show that if you can get the patient to give effort and follow instruction at least to my exam today I do not see any foot drop on the right.  He has 5 out of 5 strength with EHL and dorsiflexion plantarflexion on the right.  He is in a wheelchair and I did not stand him up Sharol Given his orthostatic hypotension problems currently.  Skin:    General: Skin is warm  and dry.     Findings: No erythema or rash.  Neurological:     General: No focal deficit present.     Mental Status: He is alert and oriented to person, place, and time.     Sensory: No sensory deficit.     Coordination: Coordination normal.     Gait: Gait normal.  Psychiatric:     Comments: Patient perseverates on his right thoracic pain endorsing that all he wants is the pain to go away.  He has some difficulty answering questions about his other body extremities in terms of pain.    Ortho Exam  Imaging: No results found.  Past Medical/Family/Surgical/Social History: Medications & Allergies reviewed per EMR, new medications updated. Patient Active Problem List   Diagnosis Date Noted   Syncope and collapse 10/08/2020   Acute renal failure superimposed on stage 3b chronic kidney disease ([place]) 10/08/2020   Memory loss 10/08/2020   Hypothyroid 10/08/2020   Hypertensive urgency 10/05/2020   Situational depression 01/22/2020   Urinary frequency 01/22/2020   Bacteremia due to Staphylococcus epidermidis 11/24/2019   Stroke (cerebrum) (HCC)    Elevated troponin    Closed right hip fracture ([place]) 11/15/2019   Unilateral primary osteoarthritis, right knee 11/13/2019   At high risk for falls 05/29/2019   Chronic bilateral low back pain without sciatica 02/07/2019   Statin intolerance 11/16/2018   Hyperlipidemia, mixed 08/15/2018   Low back pain 07/18/2018   History of lacunar cerebrovascular accident (CVA) 02/12/2018   Dizziness 02/12/2018   Myofascial pain 12/18/2017   Spondylosis without myelopathy or radiculopathy, lumbar region 12/18/2017   Lumbar radiculopathy, right 12/18/2017   AKI (acute kidney injury) ([place])    Cholelithiasis 05/17/2017   Sinus Bradycardia 05/17/2017   Thoracic radiculopathy 04/12/2017   Primary osteoarthritis of right knee 12/14/2016   DDD (degenerative disc disease), lumbar 10/10/2016   Lumbar facet arthropathy 08/29/2016   Idiopathic scoliosis  03/22/2016   Diabetic neuropathy ([place]) 03/22/2016   Dysautonomia orthostatic hypotension syndrome 02/25/2016   CKD stage 3 due to type 2 diabetes mellitus ([place]) 02/19/2016   Coronary artery disease involving coronary bypass graft of native heart without angina pectoris    Diabetes  mellitus type 2, diet-controlled ([place])    Intercostal neuralgia 12/24/2015   Medication management 08/11/2015   S/P CABG x 4 07/06/2015   PVD (peripheral vascular disease) ([place]) 01/01/2014   Hyperlipidemia associated with type 2 diabetes mellitus ([place]) 04/11/2013   HTN (hypertension)    Vitamin D deficiency    Vitamin B 12 deficiency    Coronary atherosclerosis- s/p PCI to LAD in 2009 and PCI to RCA in 2011 02/27/2009   Abdominal aortic aneurysm ([place]) 02/27/2009   GERD 02/27/2009   Past Medical History:  Diagnosis Date   AAA (abdominal aortic aneurysm) ([place])    Aneurysm of iliac artery (HCC)    CAD (coronary artery disease)    a. s/p CABGx4 in 07/2015.   Carotid artery disease ([place])    Colon polyps    Diabetes ([place])    Difficult intubation    Duodenal ulcer disease 08/15/2018   Esophageal reflux    High cholesterol    History of IBS 02/27/2009   Hypertension    pt denies, he says he has a h/o hypotension. If BP up he adjusts the Florinef   Jejunitis with partial SBO 05/20/2017   Orthostatic hypotension    Prostatitis    Stroke [facility])    TIA (transient ischemic attack)    Upper GI bleed 06/24/2018   Vitamin B 12 deficiency    Vitamin D deficiency    Family History  Problem Relation Age of Onset   Heart attack Father        died age 73   Heart attack Brother        died age 18   Anuerysm Brother        aortic   Heart attack Sister        died age 23   Colon cancer Sister    Liver cancer Sister    Diabetes Maternal Grandmother    Arthritis Mother    Dementia Mother    Heart Problems Brother    Heart Problems Brother    Heart Problems Brother    Heart Problems Brother    Healthy Daughter     Healthy Son    Esophageal cancer Neg Hx    Inflammatory bowel disease Neg Hx    Pancreatic cancer Neg Hx    Stomach cancer Neg Hx    Past Surgical History:  Procedure Laterality Date   BIOPSY  06/25/2018   Procedure: BIOPSY;  Surgeon: Mansouraty, Telford Nab., MD;  Location: [facility] ENDOSCOPY;  Service: Gastroenterology;;   BIOPSY  04/24/2019   Procedure: BIOPSY;  Surgeon: Irving Copas., MD;  Location: [facility] ENDOSCOPY;  Service: Gastroenterology;;   BLADDER SURGERY  1969   traumatic pelvic fractures, urethral and bladder repair   CARDIAC CATHETERIZATION N/A 07/01/2015   Procedure: Left Heart Cath and Coronary Angiography;  Surgeon: Wellington Hampshire, MD;  Location: [place] [facility] LAB;  Service: Cardiovascular;  Laterality: N/A;   COLON RESECTION N/A 05/17/2017   Procedure: DIAGNOSTIC LAPAROSCOPY,;  Surgeon: Leighton Ruff, MD;  Location: [facility] ORS;  Service: General;  Laterality: N/A;   COLONOSCOPY WITH PROPOFOL N/A 04/24/2019   Procedure: COLONOSCOPY WITH PROPOFOL;  Surgeon: Irving Copas., MD;  Location: [facility] ENDOSCOPY;  Service: Gastroenterology;  Laterality: N/A;   CORONARY ANGIOPLASTY WITH STENT PLACEMENT     CORONARY ARTERY BYPASS GRAFT N/A 07/06/2015   Procedure: CORONARY ARTERY BYPASS GRAFTING (CABG)x 4   utilizing the left internal mammary artery and endoscopically harvested bilateral  sapheneous vein.;  Surgeon: Tharon Aquas Trigt,  MD;  Location: [facility] OR;  Service: Open Heart Surgery;  Laterality: N/A;   ESOPHAGEAL DILATION  04/24/2019   Procedure: ESOPHAGEAL DILATION;  Surgeon: Rush Landmark Telford Nab., MD;  Location: [facility];  Service: Gastroenterology;;   ESOPHAGOGASTRODUODENOSCOPY (EGD) WITH PROPOFOL N/A 06/25/2018   Procedure: ESOPHAGOGASTRODUODENOSCOPY (EGD) WITH PROPOFOL;  Surgeon: Irving Copas., MD;  Location: [place];  Service: Gastroenterology;  Laterality: N/A;   ESOPHAGOGASTRODUODENOSCOPY (EGD) WITH PROPOFOL N/A 04/24/2019   Procedure:  ESOPHAGOGASTRODUODENOSCOPY (EGD) WITH PROPOFOL;  Surgeon: Rush Landmark Telford Nab., MD;  Location: [facility] ENDOSCOPY;  Service: Gastroenterology;  Laterality: N/A;   FEMUR IM NAIL Right 11/16/2019   Procedure: RIGHT HIP INTRAMEDULLARY (IM) NAIL FEMORAL;  Surgeon: Meredith Pel, MD;  Location: [place];  Service: Orthopedics;  Laterality: Right;   HEMOSTASIS CLIP PLACEMENT  04/24/2019   Procedure: HEMOSTASIS CLIP PLACEMENT;  Surgeon: Irving Copas., MD;  Location: [facility];  Service: Gastroenterology;;   KNEE SURGERY     LOOP RECORDER INSERTION N/A 02/19/2018   Procedure: LOOP RECORDER INSERTION;  Surgeon: Deboraha Sprang, MD;  Location: [place] [facility] LAB;  Service: Cardiovascular;  Laterality: N/A;   POLYPECTOMY  04/24/2019   Procedure: POLYPECTOMY;  Surgeon: Rush Landmark Telford Nab., MD;  Location: [facility];  Service: Gastroenterology;;   SUBMUCOSAL LIFTING INJECTION  04/24/2019   Procedure: SUBMUCOSAL LIFTING INJECTION;  Surgeon: Irving Copas., MD;  Location: [facility] ENDOSCOPY;  Service: Gastroenterology;;   TEE WITHOUT CARDIOVERSION N/A 07/06/2015   Procedure: TRANSESOPHAGEAL ECHOCARDIOGRAM (TEE);  Surgeon: Ivin Poot, MD;  Location: [place];  Service: Open Heart Surgery;  Laterality: N/A;   Social History   Occupational History   Occupation: Retired  Tobacco Use   Smoking status: Former    Pack years: 0.00   Smokeless tobacco: Former    Types: Chew    Quit date: 1989  Vaping Use   Vaping Use: Never used  Substance and Sexual Activity   Alcohol use: No   Drug use: No   Sexual activity: Not Currently

## 2020-10-22 NOTE — Progress Notes (Signed)
Pt state middle back pain that travels to his shoulder blade. Pt state any movement makes the pain worse. Pt state he take pain meds to help ease his pain. Pt wife state pt feel last Thursday and had a burst on the top of his head. Pt state his heart Dr. Luna Glasgow when he stand his blood pressure drop.   Numeric Pain Rating Scale and Functional Assessment Average Pain 6   In the last MONTH (on 0-10 scale) has pain interfered with the following?  1. General activity like being  able to carry out your everyday physical activities such as walking, climbing stairs, carrying groceries, or moving a chair?  Rating(10)   +Driver, -BT, -Dye Allergies.

## 2020-10-23 ENCOUNTER — Ambulatory Visit (INDEPENDENT_AMBULATORY_CARE_PROVIDER_SITE_OTHER): Payer: Medicare Other

## 2020-10-23 ENCOUNTER — Encounter: Payer: Self-pay | Admitting: Physical Medicine and Rehabilitation

## 2020-10-23 ENCOUNTER — Other Ambulatory Visit: Payer: Self-pay | Admitting: Internal Medicine

## 2020-10-23 DIAGNOSIS — N3001 Acute cystitis with hematuria: Secondary | ICD-10-CM

## 2020-10-23 MED ORDER — CIPROFLOXACIN HCL 500 MG PO TABS: 500.0000 mg | ORAL_TABLET | Freq: Two times a day (BID) | ORAL | 0 refills | Status: AC

## 2020-10-23 NOTE — Progress Notes (Signed)
Patient presents to the office for a nurse visit to leave a urine specimen for suspected Cystitis. Patient is experiencing Dysuria with Hematuria. Symptoms x 2 days. Vitals taken and recorded.

## 2020-10-24 LAB — CUP PACEART REMOTE DEVICE CHECK
Date Time Interrogation Session: 20220625041558
Implantable Pulse Generator Implant Date: 20191021

## 2020-10-25 ENCOUNTER — Encounter: Payer: Self-pay | Admitting: Internal Medicine

## 2020-10-25 LAB — URINALYSIS, ROUTINE W REFLEX MICROSCOPIC
Bilirubin Urine: NEGATIVE
Glucose, UA: NEGATIVE
Ketones, ur: NEGATIVE
Nitrite: NEGATIVE
Specific Gravity, Urine: 1.024 (ref 1.001–1.035)
Squamous Epithelial / HPF: NONE SEEN /HPF (ref <=5)
WBC, UA: >=60 /HPF — AB (ref 0–5)
pH: 5.5 (ref 5.0–8.0)

## 2020-10-25 LAB — URINE CULTURE
MICRO NUMBER:: 12048597
SPECIMEN QUALITY:: ADEQUATE

## 2020-10-25 LAB — MICROSCOPIC MESSAGE

## 2020-10-25 NOTE — Progress Notes (Signed)
============================================================ ============================================================  -   Urine culture returned (+) Positive for Infection which fortunately is very  sensitive to the Cipro antibiotic that was prescribed                              - So please finish theantibiotic   - And about 2 weeks after complete the Antibiotic ,                      schedule a Nurse Visit to recheck U/A & Culture  ============================================================ ============================================================

## 2020-10-26 ENCOUNTER — Telehealth: Payer: Self-pay | Admitting: *Deleted

## 2020-10-26 ENCOUNTER — Ambulatory Visit (INDEPENDENT_AMBULATORY_CARE_PROVIDER_SITE_OTHER): Payer: Medicare Other

## 2020-10-26 DIAGNOSIS — I6310 Cerebral infarction due to embolism of unspecified precerebral artery: Secondary | ICD-10-CM | POA: Diagnosis not present

## 2020-10-26 NOTE — Telephone Encounter (Signed)
I spoke to his wife and provided her with the EEG results.

## 2020-10-26 NOTE — Telephone Encounter (Signed)
-----   Message from Garvin Fila, MD sent at 10/26/2020  5:28 PM EDT ----- [name] inform the patient that EEG study was normal

## 2020-10-26 NOTE — Progress Notes (Signed)
Kindly inform the patient that EEG study was normal

## 2020-10-27 NOTE — Progress Notes (Signed)
PATIENT'S WIFE (EVELYN) IS AWARE OF LAB RESULTS AND INSTRUCTIONS. [place] HAS BEEN SCHEDULED FOR A URINE RE-CHECK AFTER COMPLETION OF ANTIBIOTICS ON Monday July 11TH AT 11:00 AM. Marcelino Scot

## 2020-10-28 ENCOUNTER — Encounter (HOSPITAL_COMMUNITY): Payer: Medicare Other

## 2020-10-28 ENCOUNTER — Other Ambulatory Visit: Payer: Self-pay

## 2020-10-28 ENCOUNTER — Encounter: Payer: Self-pay | Admitting: Internal Medicine

## 2020-10-28 ENCOUNTER — Ambulatory Visit: Payer: Medicare Other | Admitting: Internal Medicine

## 2020-10-28 VITALS — BP 142/88 | HR 76 | Temp 97.5°F | Resp 17 | Ht 72.0 in | Wt 185.0 lb

## 2020-10-28 DIAGNOSIS — R5381 Other malaise: Secondary | ICD-10-CM | POA: Diagnosis not present

## 2020-10-28 DIAGNOSIS — I951 Orthostatic hypotension: Secondary | ICD-10-CM

## 2020-10-28 DIAGNOSIS — Z9181 History of falling: Secondary | ICD-10-CM

## 2020-10-28 DIAGNOSIS — Z79899 Other long term (current) drug therapy: Secondary | ICD-10-CM

## 2020-10-28 DIAGNOSIS — R2681 Unsteadiness on feet: Secondary | ICD-10-CM

## 2020-10-28 DIAGNOSIS — R0989 Other specified symptoms and signs involving the circulatory and respiratory systems: Secondary | ICD-10-CM

## 2020-10-28 LAB — BASIC METABOLIC PANEL WITH GFR
BUN/Creatinine Ratio: 22 (calc) (ref 6–22)
BUN: 52 mg/dL — ABNORMAL HIGH (ref 7–25)
CO2: 30 mmol/L (ref 20–32)
Calcium: 8.7 mg/dL (ref 8.6–10.3)
Chloride: 107 mmol/L (ref 98–110)
Creat: 2.37 mg/dL — ABNORMAL HIGH (ref 0.70–1.11)
GFR, Est African American: 28 mL/min/{1.73_m2} — ABNORMAL LOW (ref >=60)
GFR, Est Non African American: 24 mL/min/{1.73_m2} — ABNORMAL LOW (ref >=60)
Glucose, Bld: 99 mg/dL (ref 65–99)
Potassium: 4.5 mmol/L (ref 3.5–5.3)
Sodium: 144 mmol/L (ref 135–146)

## 2020-10-28 NOTE — Progress Notes (Addendum)
Future Appointments  Date Time Provider [place]  10/28/2020  4:30 PM [name], MD [facility] None  11/12/2020  9:15 AM [name], MD [facility] None  11/19/2020  3:15 PM Darreld Mclean, PA-C [facility]  12/30/2020 11:00 AM Angelia Mould, MD [facility]  03/31/2021  1:00 PM Garvin Fila, MD [facility] None  06/09/2021 11:30 AM Garnet Sierras, NP [facility] None  10/14/2021 10:00 AM [name], MD [facility] None    History of Present Illness:     This very nice 87 yo MWN with severe postural dysautonomia - labile supine-sitting HTN & orthostatic hypotension returns for f/u after restarting Florinef % & d/c'ing HT agents . Last visit Florinef was increased to 2 tabs and ProAmatine re-added. Patient is predominantly in Wheelchair due to postural hypotension.  Patient's gait has become increasing unstable & he presents as high fall risk. Likewise, he has become progressively, more physically deconditioned and unable to safely stand to perform his personal ADL's.   Medications    FLORINEF) 0.1 MG tablet, Take 2 tablets daily.   levothyroxine 25 MCG tablet, Take 1 tablet daily   midodrine (PROAMATINE) 5 MG tablet, Take 1 tablet 3 x  /dat   olmesartan  40 MG tablet, Take 20-40 mg daily    pravastatin 40 MG tablet, Take 1 tablet  daily.    acetaminophen 500 MG tablet, Take 1,000 mg - morning and bedtime.   aspirin EC 81 MG tablet, Take 1 tablet daily.  FIORICET, Take 1 tablet every 4 hours as needed    meloxicam  15 MG tablet, Take 0.5-1 tablets daily   VITAMIN B-12) 3000 MCG SL, 1 tablet under tongue daily.   ferrous sulfate 325 MG tablet, Take daily     buPROPion-XL 150 MG  Take 1 tablet  daily.   CALCIUM 1200 , Take 1 tablet  daily.   Cholecalciferol (VITAMIN D-3) 5000 units TABS, Take 5,000 Units by mouth daily.   ciprofloxacin 500 MG tablet, Take 1 tablet 2 times daily .   gabapentin 100 MG capsule, Take 3 times daily.   memantine 10 MG  tablet, Take 1 tablet 2 times daily.   Multiple Vitamin TABS tablet, Take 1 tablet  daily.   pantoprazole 40 MG tablet, Take 1 tablet daily.   Potassium 99 MG , Take 1 tablet by mouth daily.   SENOKOT , Take daily   Zinc 50 MG TABS, Take 1 tablet daily.  Problem list He has Coronary atherosclerosis- s/p PCI to LAD in 2009 and PCI to RCA in 2011; Abdominal aortic aneurysm ([place]); GERD; HTN (hypertension); Vitamin D deficiency; Vitamin B 12 deficiency; Hyperlipidemia associated with type 2 diabetes mellitus ([place]); PVD (peripheral vascular disease) ([place]); S/P CABG x 4; Medication management; Intercostal neuralgia; Coronary artery disease involving coronary bypass graft of native heart without angina pectoris; Diabetes mellitus type 2, diet-controlled ([place]); CKD stage 3 due to type 2 diabetes mellitus ([place]); Dysautonomia orthostatic hypotension syndrome; Idiopathic scoliosis; Diabetic neuropathy ([place]); Lumbar facet arthropathy; DDD (degenerative disc disease), lumbar; Primary osteoarthritis of right knee; Thoracic radiculopathy; Cholelithiasis; Sinus Bradycardia; AKI (acute kidney injury) ([place]); Myofascial pain; Spondylosis without myelopathy or radiculopathy, lumbar region; Lumbar radiculopathy, right; History of lacunar cerebrovascular accident (CVA); Dizziness; Low back pain; Hyperlipidemia, mixed; Statin intolerance; Chronic bilateral low back pain without sciatica; At high risk for falls; Unilateral primary osteoarthritis, right knee; Closed right hip fracture ([place]); Elevated troponin; Stroke (cerebrum) ([place]); Bacteremia due to Staphylococcus epidermidis; Situational depression; Urinary  frequency; Hypertensive urgency; Syncope and collapse; Acute renal failure superimposed on stage 3b chronic kidney disease ([place]); Memory loss; and Hypothyroid on their problem list.   Observations/Objective:  BP (!) 142/88   Pulse 76   Temp (!) 97.5 F (36.4 C)   Resp 17   Ht 6' (1.829 m)   Wt 185 lb (83.9 kg)    SpO2 99%   BMI 25.09 kg/m   Postural    Sitting BP 163/97  P 72         &         Standing BP 106/73     P 90  HEENT - WNL. Neck - supple.  Chest - Clear equal BS. Cor - Nl HS. RRR w/o sig MGR. PP 1(+). No edema. MS- FROM w/o deformities.  Generalized decrease in muscle power, tone & bulk. Unable to stand independently. In wheelchair. Neuro -  Nl w/o focal abnormalities.   Assessment and Plan:   1. Dysautonomia orthostatic hypotension syndrome  - BASIC METABOLIC PANEL WITH GFR - Ambulatory referral to Home Health  2. Medication management  - BASIC METABOLIC PANEL WITH GFR  3. Physical deconditioning  - Ambulatory referral to Home Health  4. Unstable gait  - Ambulatory referral to Home Health  5. At high risk for injury related to fall  - Ambulatory referral to [place]  6. Orthostatic syncope   7. Labile hypertension    Follow Up Instructions:      I discussed the assessment and treatment plan with the patient. The patient was provided an opportunity to ask questions and all were answered. The patient agreed with the plan and demonstrated an understanding of the instructions.       The patient was advised to call back or seek an in-person evaluation if the symptoms worsen or if the condition fails to improve as anticipated.   Kirtland Bouchard, MD

## 2020-10-29 ENCOUNTER — Other Ambulatory Visit: Payer: Self-pay | Admitting: Internal Medicine

## 2020-10-29 MED ORDER — DEXAMETHASONE 4 MG PO TABS: ORAL_TABLET | ORAL | 0 refills | Status: DC

## 2020-10-29 NOTE — Progress Notes (Signed)
============================================================ -   Test results slightly outside the reference range are not unusual. If there is anything important, I will review this with you,  otherwise it is considered normal test values.  If you have further questions,  please do not hesitate to contact me at the office or via My Chart.  ============================================================ ============================================================  -  Blood tests suggest mildly dehydrated, So Please drink more fluids  /water      Very important to drink adequate amounts of fluids to                                                                    prevent permanent kidney  damage    - Recommend drink at least 6 bottles (16 ounces) of                                                                     fluids /water /day = 96 Oz ~100 oz  - 100 oz = 3,000 cc or 3 liters / day  - >>                                                That's 1 &1/2 bottles of a 2 liter soda bottle /day !   ============================================================ ============================================================

## 2020-10-30 NOTE — Progress Notes (Deleted)
Pt w DG/CCMAas called to discuss his lab results. Pt did not answer the phone a VM was left for him to give the office a call on/ 10/30/20.

## 2020-11-01 ENCOUNTER — Encounter: Payer: Self-pay | Admitting: Internal Medicine

## 2020-11-03 ENCOUNTER — Other Ambulatory Visit: Payer: Self-pay | Admitting: Internal Medicine

## 2020-11-03 NOTE — Addendum Note (Signed)
Addended by: [name] on: 11/03/2020 09:28 PM   Modules accepted: Orders

## 2020-11-04 ENCOUNTER — Telehealth: Payer: Self-pay

## 2020-11-04 ENCOUNTER — Telehealth: Payer: Self-pay | Admitting: Internal Medicine

## 2020-11-04 NOTE — Telephone Encounter (Signed)
patient spouse called and left message  requesting current Home Health orders  to include  bathing w/ [place].  Per Dr .[name], Returned call to spouse, Estill Bamberg,  states she no longer needs [facility], she has worked out a system for bathing the patient . [facility] completed last visit today with patient. So she no longer feels he is eligible for HH at any level. I advised that a new order can be sent to [place] to revaluate his needs, including aide to assist in bathing. Another option given was a evaluation by  Palliative Care for over site visits, ADL assistance, respite care, and care giver support.  She states Mr. Chaloux only wants her to assist him with bathing at this point. She will consider options and call me back if she decides to move forward with any of these options or if any other needs arise. I will mail out information on palliative care benefits. I reminded her to care for herself also. Estill Bamberg was very appreciative and stated when she left the original message she had sustained an injury that required medical assistance and was overwhelmed and stressed out for the patient without her present in the home,  while she was at the ER receiving care for cut vessel in her hand. Now she is home, she feels better.

## 2020-11-04 NOTE — Telephone Encounter (Signed)
Pt home health nurse called and stated his eye lids,Face and both feet are swollen. X 2days. Pt nurse Arvil Chaco  is asking for a verbal odere to see him 2 more times . 1 this week and  1 day next week. Per Dr. Melford Aase he stated it was ok. DG/CCMA

## 2020-11-05 ENCOUNTER — Other Ambulatory Visit: Payer: Self-pay | Admitting: Internal Medicine

## 2020-11-05 ENCOUNTER — Other Ambulatory Visit: Payer: Self-pay | Admitting: Adult Health

## 2020-11-06 NOTE — Progress Notes (Deleted)
Cardiology Office Note:    Date:  11/06/2020   ID:  [name], DOB 20-Jul-1933, MRN [id number]  PCP:  [name], MD  Cardiologist:  Kirk Ruths, MD  Electrophysiologist:  [name], MD   Referring MD: [name], MD   Chief Complaint: hospital follow-up for orthostatic hypotension  History of Present Illness:    [name] is a 87 y.o. male with a history of CAD s/p CABG x4 in 08/2015, bilateral carotid stenosis, AAA with right iliac artery aneurysm and left common iliac artery aneurysm, CVA s/p loop recorder implant, hypertension with orthostatic hypotension, hyperlipidemia, type 2 diabetes mellitus, CKD stage III, GI bleed secondary to duodenal ulcer in 06/2018, IBS, and mild dementia who is followed by Dr. Stanford Breed and presents today for hospital follow-up for orthostatic hypotension.  Cardiac catheterization in 07/2015 showed 3 vessel CAD. Patient underwent CABG x4 on 07/06/2016. Post-op course was unremarkable. However, patient has had difficulties orthostasis and hypotension since his CABG. Patient had loop recorder placed in 01/2018 after suffering a stroke. Carotid dopplers in 10/2019 showed 40-59% stenosis of bilateral ICAs. In 10/2019, patient suffered a mechanical fall requiring repair of right hip fracture. Postoperative course was complicated by acute CVA. Also had elevated troponin at the time felt to be secondary to demand ischemia. Echo during this admission showed normal LV function. Abdominal ultrasound in 01/2020 showed AAA measuring 4.2cm as well as a left common iliac artery aneurysm measuring 3.1cm. Patient was seen by Vascular Surgery and follow-up ultrasound was recommended in 9 months. Patient was last seen by Dr. Stanford Breed in 07/2020 at which time he continued to have some dizziness with standing at times but was otherwise doing well from a cardiac standpoint.   Since last visit, patient was admitted from 10/05/2020 to 10/07/2020 with hypertensive  urgency and low back pain after a mechanical fall. CT showed his infrarenal aortic aneurysm had increased to 4.5cm. MRI showed no acute findings but extensive chronic dysfunction likely the cause of patient's pain. Vascular Surgery was consulted and saw no evidence of impending rupture on the CTA to mandate emergent repair. Plan was for outpatient duplex of AAA. For his hypertension, home Florinef was stopped, Hydralazine 25mg  three times daily was added, and Amlodipine was increased to 10mg  dialy. Home Olmesartan 40mg  daily was continued.  Patient was readmitted from 10/08/2020 to 10/12/2020 after 2 unwitnessed episodes of syncope and a fall at home. He was hypotensive when EMS arrive and was given 1L of normal saline and then brought to the ED where he was also noted to be in AKI with creatinine of 2.61 (baseline 1.4-1.8). High-sensitivity troponin negative x2. Echo showed LVEF of 55-60% with normal wall motion and severe LVH. RV normal. Also showed mildly dilatated aortic root measuring 25mm. Syncope was felt to be due to hypotension after recent medication changes. He was treated with IV fluids and restarted on Florinef. Hydralazine, Amlodipine, and Flomax were discontinued. He was continued on Olmesartan 20-40mg  as needed for high blood pressure. He remained orthostatic but symptoms were much improved. Orthostatic felt unlikely to completely resolved. Suspected to have underlying chronic autonomic dysfunction. Goal is for patient to have minimal symptoms with systolic BP >40 with standing. Compression stocking were recommended. Abdominal binder also recommended but patient had refused this in the past.   Patient was seen by PCP on 6/15/20220 and then again 10/28/2020 for follow-up. BP had improved. However, he was noted to be very unsteady on his feet due to postural hypotension.  He reported be predominantly in a wheelchair because of this and was having trouble standing to perform ADLs. He was restarted on  Midodrine and Florinef was increased. Repeat BMET on 10/28/2020 showed creatinine of 2.37. He was encouraged to make sure he was staying well hydrated.   Patient presents today for follow-up. ***  Hypertension with Chronic Orthostatic Hypotension - *** - Continue Florinef 0.1mg  twice dialy. - Continue Midodrine 5mg  three times daily.  - Continue Olmesartan 20-40mg  daily PRN for high BP.  CAD s/p CABG in 2017 - No angina. - Continue aspirin and statin.  AAA - CTA in 09/2020 showed 4.5 x4.1cm infrarenal abdominal aortic aneurysm, bilateral common iliac artery aneurysms, and bilateral internal iliac artery aneurysm. - Repeat AAA duplex and follow-up with Vascular Surgery scheduled for August 2022.   Hyperlipidemia - *** - Continue Pravastatin 40mg  daily.  Type 2 Diabetes Mellitus  *** - Management per PCP.  CKD Stage III-IV - Most recent creatinine 2.37. Creatinine over the last month has been around 1.9 to 2.2; however, baseline earlier this year was 1.4 to 1.8. This may be patient's new baseline. - Encouraged patient to stay well hydrated.  - Followed by PCP.   Past Medical History:  Diagnosis Date   AAA (abdominal aortic aneurysm) ([place])    Aneurysm of iliac artery ([facility])    CAD (coronary artery disease)    a. s/p CABGx4 in 07/2015.   Carotid artery disease ([place])    Colon polyps    Diabetes ([place])    Difficult intubation    Duodenal ulcer disease 08/15/2018   Esophageal reflux    High cholesterol    History of IBS 02/27/2009   Hypertension    pt denies, he says he has a h/o hypotension. If BP up he adjusts the Florinef   Jejunitis with partial SBO 05/20/2017   Orthostatic hypotension    Prostatitis    Stroke [facility])    TIA (transient ischemic attack)    Upper GI bleed 06/24/2018   Vitamin B 12 deficiency    Vitamin D deficiency     Past Surgical History:  Procedure Laterality Date   BIOPSY  06/25/2018   Procedure: BIOPSY;  Surgeon: Irving Copas., MD;   Location: [place];  Service: Gastroenterology;;   BIOPSY  04/24/2019   Procedure: BIOPSY;  Surgeon: Irving Copas., MD;  Location: [facility];  Service: Gastroenterology;;   [place]   traumatic pelvic fractures, urethral and bladder repair   CARDIAC CATHETERIZATION N/A 07/01/2015   Procedure: Left Heart Cath and Coronary Angiography;  Surgeon: Wellington Hampshire, MD;  Location: [place] [facility];  Service: Cardiovascular;  Laterality: N/A;   COLON RESECTION N/A 05/17/2017   Procedure: DIAGNOSTIC LAPAROSCOPY,;  Surgeon: Leighton Ruff, MD;  Location: [facility];  Service: General;  Laterality: N/A;   COLONOSCOPY WITH PROPOFOL N/A 04/24/2019   Procedure: COLONOSCOPY WITH PROPOFOL;  Surgeon: Irving Copas., MD;  [facility];  Service: Gastroenterology;  Laterality: N/A;   CORONARY ANGIOPLASTY WITH STENT PLACEMENT     CORONARY ARTERY BYPASS GRAFT N/A 07/06/2015   Procedure: CORONARY ARTERY BYPASS GRAFTING (CABG)x 4   utilizing the left internal mammary artery and endoscopically harvested bilateral  sapheneous vein.;  Surgeon: Ivin Poot, MD;  Location: [place];  Service: Open Heart Surgery;  Laterality: N/A;   ESOPHAGEAL DILATION  04/24/2019   Procedure: ESOPHAGEAL DILATION;  Surgeon: Rush Landmark Telford Nab., MD;  Location: [facility];  Service: Gastroenterology;;   ESOPHAGOGASTRODUODENOSCOPY (  EGD) WITH PROPOFOL N/A 06/25/2018   Procedure: ESOPHAGOGASTRODUODENOSCOPY (EGD) WITH PROPOFOL;  Surgeon: Rush Landmark Telford Nab., MD;  Location: [place];  Service: Gastroenterology;  Laterality: N/A;   ESOPHAGOGASTRODUODENOSCOPY (EGD) WITH PROPOFOL N/A 04/24/2019   Procedure: ESOPHAGOGASTRODUODENOSCOPY (EGD) WITH PROPOFOL;  Surgeon: Rush Landmark Telford Nab., MD;  [facility];  Service: Gastroenterology;  Laterality: N/A;   FEMUR IM NAIL Right 11/16/2019   Procedure: RIGHT HIP INTRAMEDULLARY (IM) NAIL FEMORAL;  Surgeon: Meredith Pel, MD;   Location: [place];  Service: Orthopedics;  Laterality: Right;   HEMOSTASIS CLIP PLACEMENT  04/24/2019   Procedure: HEMOSTASIS CLIP PLACEMENT;  Surgeon: Irving Copas., MD;  Location: [facility];  Service: Gastroenterology;;   KNEE SURGERY     LOOP RECORDER INSERTION N/A 02/19/2018   Procedure: LOOP RECORDER INSERTION;  Surgeon: Deboraha Sprang, MD;  Location: [place] [facility];  Service: Cardiovascular;  Laterality: N/A;   POLYPECTOMY  04/24/2019   Procedure: POLYPECTOMY;  Surgeon: Rush Landmark Telford Nab., MD;  Location: [facility];  Service: Gastroenterology;;   SUBMUCOSAL LIFTING INJECTION  04/24/2019   Procedure: SUBMUCOSAL LIFTING INJECTION;  Surgeon: Irving Copas., MD;  [facility];  Service: Gastroenterology;;   TEE WITHOUT CARDIOVERSION N/A 07/06/2015   Procedure: TRANSESOPHAGEAL ECHOCARDIOGRAM (TEE);  Surgeon: Ivin Poot, MD;  Location: [place];  Service: Open Heart Surgery;  Laterality: N/A;    Current Medications: No outpatient medications have been marked as taking for the 11/19/20 encounter (Appointment) with Darreld Mclean, PA-C.     Allergies:   Other, Cymbalta [duloxetine hcl], Keflex [cephalexin], Simvastatin, and Sudafed [pseudoephedrine]   Social History   Socioeconomic History   Marital status: Married    Spouse name: Estill Bamberg   Number of children: 2   Years of education: Not on file   Highest education level: High school graduate  Occupational History   Occupation: Retired  Tobacco Use   Smoking status: Former    Pack years: 0.00   Smokeless tobacco: Former    Types: Chew    Quit date: 1989  Vaping Use   Vaping Use: Never used  Substance and Sexual Activity   Alcohol use: No   Drug use: No   Sexual activity: Not Currently  Other Topics Concern   Not on file  Social History Narrative   Lives at home with his wife Estill Bamberg   Right handed   No caffeine   Social Determinants of Radio broadcast assistant Strain: Not  on file  Food Insecurity: Not on file  Transportation Needs: Not on file  Physical Activity: Not on file  Stress: Not on file  Social Connections: Not on file     Family History: The patient's family history includes Anuerysm in his brother; Arthritis in his mother; Colon cancer in his sister; Dementia in his mother; Diabetes in his maternal grandmother; Healthy in his daughter and son; Heart Problems in his brother, brother, brother, and brother; Heart attack in his brother, father, and sister; Liver cancer in his sister. There is no history of Esophageal cancer, Inflammatory bowel disease, Pancreatic cancer, or Stomach cancer.  ROS:   Please see the history of present illness.     EKGs/Labs/Other Studies Reviewed:    The following studies were reviewed today:  Echocardiogram 10/09/2020: Impressions: 1. Left ventricular ejection fraction, by estimation, is 55 to 60%. The  left ventricle has normal function. Left ventricular endocardial border  not optimally defined to evaluate regional wall motion. There is severe  concentric left  ventricular  hypertrophy. Indeterminate diastolic filling due to E-A fusion.   2. Right ventricular systolic function is normal. The right ventricular  size is normal. There is normal pulmonary artery systolic pressure. The  estimated right ventricular systolic pressure is 28.3 mmHg.   3. Left atrial size was severely dilated.   4. The mitral valve is normal in structure. Trivial mitral valve  regurgitation. No evidence of mitral stenosis.   5. The aortic valve is normal in structure. Aortic valve regurgitation is  not visualized. No aortic stenosis is present.   6. Aortic dilatation noted. There is mild dilatation of the aortic root,  measuring 40 mm.   7. The inferior vena cava is normal in size with greater than 50%  respiratory variability, suggesting right atrial pressure of 3 mmHg.  EKG:  EKG not ordered today.   Recent Labs: 03/05/2020:  Magnesium 2.1 10/08/2020: B Natriuretic Peptide 53.8; TSH 3.374 10/14/2020: ALT 8; Hemoglobin 12.2; Platelets 218 10/28/2020: BUN 52; Creat 2.37; Potassium 4.5; Sodium 144  Recent Lipid Panel    Component Value Date/Time   CHOL 131 03/05/2020 1046   CHOL 176 10/03/2019 1444   TRIG 94 03/05/2020 1046   HDL 36 (L) 03/05/2020 1046   HDL 34 (L) 10/03/2019 1444   CHOLHDL 3.6 03/05/2020 1046   VLDL 16 11/19/2019 1141   LDLCALC 77 03/05/2020 1046    Physical Exam:    Vital Signs: There were no vitals taken for this visit.    Wt Readings from Last 3 Encounters:  10/28/20 185 lb (83.9 kg)  10/14/20 185 lb 9.6 oz (84.2 kg)  10/12/20 184 lb 11.2 oz (83.8 kg)     General: 87 y.o. male in no acute distress. HEENT: Normocephalic and atraumatic. Sclera clear. EOMs intact. Neck: Supple. No carotid bruits. No JVD. Heart: *** RRR. Distinct S1 and S2. No murmurs, gallops, or rubs. Radial and distal pedal pulses 2+ and equal bilaterally. Lungs: No increased work of breathing. Clear to ausculation bilaterally. No wheezes, rhonchi, or rales.  Abdomen: Soft, non-distended, and non-tender to palpation. Bowel sounds present in all 4 quadrants.  MSK: Normal strength and tone for age. *** Extremities: No lower extremity edema.    Skin: Warm and dry. Neuro: Alert and oriented x3. No focal deficits. Psych: Normal affect. Responds appropriately.   Assessment:    No diagnosis found.  Plan:     Disposition: Follow up in ***   Medication Adjustments/Labs and Tests Ordered: Current medicines are reviewed at length with the patient today.  Concerns regarding medicines are outlined above.  No orders of the defined types were placed in this encounter.  No orders of the defined types were placed in this encounter.   There are no Patient Instructions on file for this visit.   Signed, Darreld Mclean, PA-C  11/06/2020 2:46 PM    [facility]

## 2020-11-09 ENCOUNTER — Telehealth: Payer: Self-pay | Admitting: Internal Medicine

## 2020-11-09 ENCOUNTER — Ambulatory Visit: Payer: Medicare Other

## 2020-11-09 NOTE — Telephone Encounter (Signed)
Spouse calls to request Palliative consult, per Dr [name], faxed Palliative Order to [facility]. Family aware.

## 2020-11-11 ENCOUNTER — Other Ambulatory Visit: Payer: Self-pay | Admitting: Internal Medicine

## 2020-11-11 ENCOUNTER — Ambulatory Visit (HOSPITAL_COMMUNITY)
Admission: RE | Admit: 2020-11-11 | Payer: Medicare Other | Source: Ambulatory Visit | Attending: Cardiology | Admitting: Cardiology

## 2020-11-11 ENCOUNTER — Telehealth: Payer: Self-pay | Admitting: Internal Medicine

## 2020-11-11 DIAGNOSIS — N3000 Acute cystitis without hematuria: Secondary | ICD-10-CM

## 2020-11-11 NOTE — Telephone Encounter (Signed)
patient's wife, Estill Bamberg, called to request that she bring in patient's urine sample instead of scheduled NV. Patient c/o burning.  Also, Can we request recertification of skilled nursing w/ [facility]. Last [facility] visit is this Friday, July 15th.

## 2020-11-12 ENCOUNTER — Ambulatory Visit: Payer: Medicare Other

## 2020-11-12 ENCOUNTER — Encounter: Payer: Self-pay | Admitting: Internal Medicine

## 2020-11-12 ENCOUNTER — Ambulatory Visit: Payer: Medicare Other | Admitting: Specialist

## 2020-11-12 ENCOUNTER — Other Ambulatory Visit: Payer: Self-pay

## 2020-11-12 ENCOUNTER — Encounter: Payer: Self-pay | Admitting: Specialist

## 2020-11-12 ENCOUNTER — Telehealth: Payer: Self-pay | Admitting: Internal Medicine

## 2020-11-12 VITALS — BP 187/103 | HR 71 | Ht 72.0 in | Wt 185.0 lb

## 2020-11-12 VITALS — BP 180/104 | HR 68 | Temp 97.5°F

## 2020-11-12 DIAGNOSIS — M4316 Spondylolisthesis, lumbar region: Secondary | ICD-10-CM

## 2020-11-12 DIAGNOSIS — I6381 Other cerebral infarction due to occlusion or stenosis of small artery: Secondary | ICD-10-CM

## 2020-11-12 DIAGNOSIS — M4807 Spinal stenosis, lumbosacral region: Secondary | ICD-10-CM | POA: Diagnosis not present

## 2020-11-12 DIAGNOSIS — S20211D Contusion of right front wall of thorax, subsequent encounter: Secondary | ICD-10-CM

## 2020-11-12 DIAGNOSIS — M4726 Other spondylosis with radiculopathy, lumbar region: Secondary | ICD-10-CM

## 2020-11-12 DIAGNOSIS — N179 Acute kidney failure, unspecified: Secondary | ICD-10-CM

## 2020-11-12 DIAGNOSIS — I69359 Hemiplegia and hemiparesis following cerebral infarction affecting unspecified side: Secondary | ICD-10-CM | POA: Diagnosis not present

## 2020-11-12 DIAGNOSIS — M4156 Other secondary scoliosis, lumbar region: Secondary | ICD-10-CM

## 2020-11-12 LAB — CBC WITH DIFFERENTIAL/PLATELET
Absolute Monocytes: 455 cells/uL (ref 200–950)
Basophils Absolute: 27 cells/uL (ref 0–200)
Basophils Relative: 0.3 %
Eosinophils Absolute: 264 cells/uL (ref 15–500)
Eosinophils Relative: 2.9 %
HCT: 31.9 % — ABNORMAL LOW (ref 38.5–50.0)
Hemoglobin: 10.4 g/dL — ABNORMAL LOW (ref 13.2–17.1)
Lymphs Abs: 1756 cells/uL (ref 850–3900)
MCH: 32 pg (ref 27.0–33.0)
MCHC: 32.6 g/dL (ref 32.0–36.0)
MCV: 98.2 fL (ref 80.0–100.0)
MPV: 10.8 fL (ref 7.5–12.5)
Monocytes Relative: 5 %
Neutro Abs: 6598 cells/uL (ref 1500–7800)
Neutrophils Relative %: 72.5 %
Platelets: 183 10*3/uL (ref 140–400)
RBC: 3.25 10*6/uL — ABNORMAL LOW (ref 4.20–5.80)
RDW: 13.5 % (ref 11.0–15.0)
Total Lymphocyte: 19.3 %
WBC: 9.1 10*3/uL (ref 3.8–10.8)

## 2020-11-12 MED ORDER — TRAMADOL-ACETAMINOPHEN 37.5-325 MG PO TABS: 1.0000 | ORAL_TABLET | Freq: Four times a day (QID) | ORAL | 0 refills | Status: AC | PRN

## 2020-11-12 NOTE — Telephone Encounter (Signed)
Per Dr. [name], Order faxed to [facility] to continue Skilled Nursing, 1wk3. Scanned to [employer]

## 2020-11-12 NOTE — Patient Instructions (Signed)
Plan: Avoid bending, stooping and avoid lifting weights greater than 10 lbs. Avoid prolong standing and walking. Avoid frequent bending and stooping  No lifting greater than 10 lbs. May use ice or moist heat for pain. Weight loss is of benefit. Handicap license is approved. Right ribs are at the least bruised if not broken. Avoid direct pressure over the right posterior ribs. Need to deep breath every hour to avoid getting a pneumonia.  Fall Prevention and Home Safety Falls cause injuries and can affect all age groups. It is possible to use preventive measures to significantly decrease the likelihood of falls. There are many simple measures which can make your home safer and prevent falls. OUTDOORS Repair cracks and edges of walkways and driveways. Remove high doorway thresholds. Trim shrubbery on the main path into your home. Have good outside lighting. Clear walkways of tools, rocks, debris, and clutter. Check that handrails are not broken and are securely fastened. Both sides of steps should have handrails. Have leaves, snow, and ice cleared regularly. Use sand or salt on walkways during winter months. In the garage, clean up grease or oil spills. BATHROOM Install night lights. Install grab bars by the toilet and in the tub and shower. Use non-skid mats or decals in the tub or shower. Place a plastic non-slip stool in the shower to sit on, if needed. Keep floors dry and clean up all water on the floor immediately. Remove soap buildup in the tub or shower on a regular basis. Secure bath mats with non-slip, double-sided rug tape. Remove throw rugs and tripping hazards from the floors. BEDROOMS Install night lights. Make sure a bedside light is easy to reach. Do not use oversized bedding. Keep a telephone by your bedside. Have a firm chair with side arms to use for getting dressed. Remove throw rugs and tripping hazards from the floor. KITCHEN Keep handles on pots and pans  turned toward the center of the stove. Use back burners when possible. Clean up spills quickly and allow time for drying. Avoid walking on wet floors. Avoid hot utensils and knives. Position shelves so they are not too high or low. Place commonly used objects within easy reach. If necessary, use a sturdy step stool with a grab bar when reaching. Keep electrical cables out of the way. Do not use floor polish or wax that makes floors slippery. If you must use wax, use non-skid floor wax. Remove throw rugs and tripping hazards from the floor. STAIRWAYS Never leave objects on stairs. Place handrails on both sides of stairways and use them. Fix any loose handrails. Make sure handrails on both sides of the stairways are as long as the stairs. Check carpeting to make sure it is firmly attached along stairs. Make repairs to worn or loose carpet promptly. Avoid placing throw rugs at the top or bottom of stairways, or properly secure the rug with carpet tape to prevent slippage. Get rid of throw rugs, if possible. Have an electrician put in a light switch at the top and bottom of the stairs. OTHER FALL PREVENTION TIPS Wear low-heel or rubber-soled shoes that are supportive and fit well. Wear closed toe shoes. When using a stepladder, make sure it is fully opened and both spreaders are firmly locked. Do not climb a closed stepladder. Add color or contrast paint or tape to grab bars and handrails in your home. Place contrasting color strips on first and last steps. Learn and use mobility aids as needed. Install an electrical emergency response  system. Turn on lights to avoid dark areas. Replace light bulbs that burn out immediately. Get light switches that glow. Arrange furniture to create clear pathways. Keep furniture in the same place. Firmly attach carpet with non-skid or double-sided tape. Eliminate uneven floor surfaces. Select a carpet pattern that does not visually hide the edge of steps. Be  aware of all pets. OTHER HOME SAFETY TIPS Set the water temperature for 120 F (48.8 C). Keep emergency numbers on or near the telephone. Keep smoke detectors on every level of the home and near sleeping areas. Document Released: 04/08/2002 Document Revised: 10/18/2011 Document Reviewed: 07/08/2011 [facility] Patient Information 2014 [place].

## 2020-11-12 NOTE — Progress Notes (Signed)
Patient presents to the office for a nurse visit. Patient

## 2020-11-12 NOTE — Progress Notes (Signed)
Office Visit Note   Patient: [name]           Date of Birth: 24-May-1933           MRN: [id number] Visit Date: 11/12/2020              Requested by: [name], [place] [address] [place],  [place] [id number] PCP: [name], MD   Assessment & Plan: Visit Diagnoses:  1. Contusion of rib on right side, subsequent encounter   2. Hemiparesis due to recent stroke ([place])   3. Basal ganglia stroke ([place])     Plan: Avoid bending, stooping and avoid lifting weights greater than 10 lbs. Avoid prolong standing and walking. Avoid frequent bending and stooping  No lifting greater than 10 lbs. May use ice or moist heat for pain. Weight loss is of benefit. Handicap license is approved. Right ribs are at the least bruised if not broken. Avoid direct pressure over the right posterior ribs. Need to deep breath every hour to avoid getting a pneumonia.   Dr. Romona Curls secretary/Assistant will call to arrange for evaluation for consideration of placement of a spinal cord stimulator.    Fall Prevention and Home Safety Falls cause injuries and can affect all age groups. It is possible to use preventive measures to significantly decrease the likelihood of falls. There are many simple measures which can make your home safer and prevent falls. OUTDOORS Repair cracks and edges of walkways and driveways. Remove high doorway thresholds. Trim shrubbery on the main path into your home. Have good outside lighting. Clear walkways of tools, rocks, debris, and clutter. Check that handrails are not broken and are securely fastened. Both sides of steps should have handrails. Have leaves, snow, and ice cleared regularly. Use sand or salt on walkways during winter months. In the garage, clean up grease or oil spills. BATHROOM Install night lights. Install grab bars by the toilet and in the tub and shower. Use non-skid mats or decals in the tub or shower. Place a plastic  non-slip stool in the shower to sit on, if needed. Keep floors dry and clean up all water on the floor immediately. Remove soap buildup in the tub or shower on a regular basis. Secure bath mats with non-slip, double-sided rug tape. Remove throw rugs and tripping hazards from the floors. BEDROOMS Install night lights. Make sure a bedside light is easy to reach. Do not use oversized bedding. Keep a telephone by your bedside. Have a firm chair with side arms to use for getting dressed. Remove throw rugs and tripping hazards from the floor. KITCHEN Keep handles on pots and pans turned toward the center of the stove. Use back burners when possible. Clean up spills quickly and allow time for drying. Avoid walking on wet floors. Avoid hot utensils and knives. Position shelves so they are not too high or low. Place commonly used objects within easy reach. If necessary, use a sturdy step stool with a grab bar when reaching. Keep electrical cables out of the way. Do not use floor polish or wax that makes floors slippery. If you must use wax, use non-skid floor wax. Remove throw rugs and tripping hazards from the floor. STAIRWAYS Never leave objects on stairs. Place handrails on both sides of stairways and use them. Fix any loose handrails. Make sure handrails on both sides of the stairways are as long as the stairs. Check carpeting to make sure it is firmly attached along stairs. Make  repairs to worn or loose carpet promptly. Avoid placing throw rugs at the top or bottom of stairways, or properly secure the rug with carpet tape to prevent slippage. Get rid of throw rugs, if possible. Have an electrician put in a light switch at the top and bottom of the stairs. OTHER FALL PREVENTION TIPS Wear low-heel or rubber-soled shoes that are supportive and fit well. Wear closed toe shoes. When using a stepladder, make sure it is fully opened and both spreaders are firmly locked. Do not climb a closed  stepladder. Add color or contrast paint or tape to grab bars and handrails in your home. Place contrasting color strips on first and last steps. Learn and use mobility aids as needed. Install an electrical emergency response system. Turn on lights to avoid dark areas. Replace light bulbs that burn out immediately. Get light switches that glow. Arrange furniture to create clear pathways. Keep furniture in the same place. Firmly attach carpet with non-skid or double-sided tape. Eliminate uneven floor surfaces. Select a carpet pattern that does not visually hide the edge of steps. Be aware of all pets. OTHER HOME SAFETY TIPS Set the water temperature for 120 F (48.8 C). Keep emergency numbers on or near the telephone. Keep smoke detectors on every level of the home and near sleeping areas. Document Released: 04/08/2002 Document Revised: 10/18/2011 Document Reviewed: 07/08/2011 [facility] Patient Information 2014 [place].    Follow-Up Instructions: No follow-ups on file.   Orders:  No orders of the defined types were placed in this encounter.  Meds ordered this encounter  Medications   traMADol-acetaminophen (ULTRACET) 37.5-325 MG tablet    Sig: Take 1 tablet by mouth every 6 (six) hours as needed.    Dispense:  30 tablet    Refill:  0      Procedures: No procedures performed   Clinical Data: Findings:  Narrative & Impression CLINICAL DATA:  [place] [address] from scooter yesterday, RIGHT back pain   EXAM: RIGHT RIBS AND CHEST - 3+ VIEW   COMPARISON:  11/21/2019   FINDINGS: Loop recorder projects over cardiac silhouette.   Upper normal size of cardiac silhouette post CABG.   Tortuous aorta.   Mediastinal contours and pulmonary vascularity otherwise normal.   Peribronchial thickening with scattered interstitial prominence at the lower lungs unchanged.   No acute infiltrate, pleural effusion, or pneumothorax.   Bowel interposition between liver and diaphragm.   Bones  demineralized.   No definite rib fracture or bone destruction.   Scattered degenerative changes of thoracolumbar spine.   IMPRESSION: No acute abnormalities.     Electronically Signed   By: Lavonia Dana M.D.   On: 10/05/2020 12:41       Subjective: Chief Complaint  Patient presents with   Middle Back - Pain    87 year old male with history of lumbar DDD and spondylosis. He has weakness in his legs and most recently he fell onto his back 10/05/20 and a He was seen at [facility] where he had xrays of his back and ribs. Was transported to [facility] and spent about 7 days in the hospital. He has a UTI or kidney infecton but he has continuous pain with urination.    Review of Systems  Constitutional: Negative.   HENT: Negative.    Eyes: Negative.   Respiratory: Negative.    Cardiovascular: Negative.   Gastrointestinal: Negative.   Endocrine: Negative.   Genitourinary: Negative.   Musculoskeletal: Negative.   Skin: Negative.   Allergic/Immunologic: Negative.  Neurological: Negative.   Hematological: Negative.   Psychiatric/Behavioral: Negative.      Objective: Vital Signs: BP (!) 187/103   Pulse 71   Ht 6' (1.829 m)   Wt 185 lb (83.9 kg)   BMI 25.09 kg/m   Physical Exam Constitutional:      Appearance: He is well-developed.  HENT:     Head: Normocephalic and atraumatic.  Eyes:     Pupils: Pupils are equal, round, and reactive to light.  Pulmonary:     Effort: Pulmonary effort is normal.     Breath sounds: Normal breath sounds.  Abdominal:     General: Bowel sounds are normal.     Palpations: Abdomen is soft.  Musculoskeletal:        General: Normal range of motion.     Cervical back: Normal range of motion and neck supple.  Skin:    General: Skin is warm and dry.  Neurological:     Mental Status: He is alert and oriented to person, place, and time.  Psychiatric:        Behavior: Behavior normal.        Thought Content: Thought content normal.         Judgment: Judgment normal.    Ortho Exam  Specialty Comments:  No specialty comments available.  Imaging: No results found.   PMFS History: Patient Active Problem List   Diagnosis Date Noted   Syncope and collapse 10/08/2020   Acute renal failure superimposed on stage 3b chronic kidney disease ([place]) 10/08/2020   Memory loss 10/08/2020   Hypothyroid 10/08/2020   Hypertensive urgency 10/05/2020   Situational depression 01/22/2020   Urinary frequency 01/22/2020   Bacteremia due to Staphylococcus epidermidis 11/24/2019   Stroke (cerebrum) (HCC)    Elevated troponin    Closed right hip fracture ([place]) 11/15/2019   Unilateral primary osteoarthritis, right knee 11/13/2019   At high risk for falls 05/29/2019   Chronic bilateral low back pain without sciatica 02/07/2019   Statin intolerance 11/16/2018   Hyperlipidemia, mixed 08/15/2018   Low back pain 07/18/2018   History of lacunar cerebrovascular accident (CVA) 02/12/2018   Dizziness 02/12/2018   Myofascial pain 12/18/2017   Spondylosis without myelopathy or radiculopathy, lumbar region 12/18/2017   Lumbar radiculopathy, right 12/18/2017   AKI (acute kidney injury) ([place])    Cholelithiasis 05/17/2017   Sinus Bradycardia 05/17/2017   Thoracic radiculopathy 04/12/2017   Primary osteoarthritis of right knee 12/14/2016   DDD (degenerative disc disease), lumbar 10/10/2016   Lumbar facet arthropathy 08/29/2016   Idiopathic scoliosis 03/22/2016   Diabetic neuropathy ([place]) 03/22/2016   Dysautonomia orthostatic hypotension syndrome 02/25/2016   CKD stage 3 due to type 2 diabetes mellitus ([place]) 02/19/2016   Coronary artery disease involving coronary bypass graft of native heart without angina pectoris    Diabetes mellitus type 2, diet-controlled ([place])    Intercostal neuralgia 12/24/2015   Medication management 08/11/2015   S/P CABG x 4 07/06/2015   PVD (peripheral vascular disease) ([place]) 01/01/2014   Hyperlipidemia associated with  type 2 diabetes mellitus ([place]) 04/11/2013   HTN (hypertension)    Vitamin D deficiency    Vitamin B 12 deficiency    Coronary atherosclerosis- s/p PCI to LAD in 2009 and PCI to RCA in 2011 02/27/2009   Abdominal aortic aneurysm ([place]) 02/27/2009   GERD 02/27/2009   Past Medical History:  Diagnosis Date   AAA (abdominal aortic aneurysm) ([place])    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 s/p CABGx4 in 07/2015.   Carotid artery disease ([place])    Colon polyps    Diabetes ([place])    Difficult intubation    Duodenal ulcer disease 08/15/2018   Esophageal reflux    High cholesterol    History of IBS 02/27/2009   Hypertension    pt denies, he says he has a h/o hypotension. If BP up he adjusts the [place]   Jejunitis with partial SBO 05/20/2017   Orthostatic hypotension    Prostatitis    Stroke [facility])    TIA (transient ischemic attack)    Upper GI bleed 06/24/2018   Vitamin B 12 deficiency    Vitamin D deficiency     Family History  Problem Relation Age of Onset   Heart attack Father        died age 81   Heart attack Brother        died age 76   Anuerysm Brother        aortic   Heart attack Sister        died age 83   Colon cancer Sister    Liver cancer Sister    Diabetes Maternal Grandmother    Arthritis Mother    Dementia Mother    Heart Problems Brother    Heart Problems Brother    Heart Problems Brother    Heart Problems Brother    Healthy Daughter    Healthy Son    Esophageal cancer Neg Hx    Inflammatory bowel disease Neg Hx    Pancreatic cancer Neg Hx    Stomach cancer Neg Hx     Past Surgical History:  Procedure Laterality Date   BIOPSY  06/25/2018   Procedure: BIOPSY;  Surgeon: Mansouraty, Telford Nab., MD;  Location: [facility] ENDOSCOPY;  Service: Gastroenterology;;   BIOPSY  04/24/2019   Procedure: BIOPSY;  Surgeon: Irving Copas., MD;  Location: [facility] ENDOSCOPY;  Service: Gastroenterology;;   BLADDER SURGERY  1969   traumatic pelvic  fractures, urethral and bladder repair   CARDIAC CATHETERIZATION N/A 07/01/2015   Procedure: Left Heart Cath and Coronary Angiography;  Surgeon: Wellington Hampshire, MD;  Location: [place] CV LAB;  Service: Cardiovascular;  Laterality: N/A;   COLON RESECTION N/A 05/17/2017   Procedure: DIAGNOSTIC LAPAROSCOPY,;  Surgeon: Leighton Ruff, MD;  Location: [facility] ORS;  Service: General;  Laterality: N/A;   COLONOSCOPY WITH PROPOFOL N/A 04/24/2019   Procedure: COLONOSCOPY WITH PROPOFOL;  Surgeon: Irving Copas., MD;  Location: [facility] ENDOSCOPY;  Service: Gastroenterology;  Laterality: N/A;   CORONARY ANGIOPLASTY WITH STENT PLACEMENT     CORONARY ARTERY BYPASS GRAFT N/A 07/06/2015   Procedure: CORONARY ARTERY BYPASS GRAFTING (CABG)x 4   utilizing the left internal mammary artery and endoscopically harvested bilateral  sapheneous vein.;  Surgeon: Ivin Poot, MD;  Location: [facility];  Service: Open Heart Surgery;  Laterality: N/A;   ESOPHAGEAL DILATION  04/24/2019   Procedure: ESOPHAGEAL DILATION;  Surgeon: Rush Landmark Telford Nab., MD;  Location: [facility] ENDOSCOPY;  Service: Gastroenterology;;   ESOPHAGOGASTRODUODENOSCOPY (EGD) WITH PROPOFOL N/A 06/25/2018   Procedure: ESOPHAGOGASTRODUODENOSCOPY (EGD) WITH PROPOFOL;  Surgeon: Irving Copas., MD;  Location: [place];  Service: Gastroenterology;  Laterality: N/A;   ESOPHAGOGASTRODUODENOSCOPY (EGD) WITH PROPOFOL N/A 04/24/2019   Procedure: ESOPHAGOGASTRODUODENOSCOPY (EGD) WITH PROPOFOL;  Surgeon: Rush Landmark Telford Nab., MD;  Location: [facility] ENDOSCOPY;  Service: Gastroenterology;  Laterality: N/A;   FEMUR IM NAIL Right 11/16/2019   Procedure: RIGHT HIP INTRAMEDULLARY (IM)  NAIL FEMORAL;  Surgeon: Meredith Pel, MD;  Location: [facility];  Service: Orthopedics;  Laterality: Right;   HEMOSTASIS CLIP PLACEMENT  04/24/2019   Procedure: HEMOSTASIS CLIP PLACEMENT;  Surgeon: Irving Copas., MD;  Location: [facility] ENDOSCOPY;  Service: Gastroenterology;;   KNEE  SURGERY     LOOP RECORDER INSERTION N/A 02/19/2018   Procedure: LOOP RECORDER INSERTION;  Surgeon: Deboraha Sprang, MD;  Location: [facility] CV LAB;  Service: Cardiovascular;  Laterality: N/A;   POLYPECTOMY  04/24/2019   Procedure: POLYPECTOMY;  Surgeon: Rush Landmark Telford Nab., MD;  Location: [facility] ENDOSCOPY;  Service: Gastroenterology;;   SUBMUCOSAL LIFTING INJECTION  04/24/2019   Procedure: SUBMUCOSAL LIFTING INJECTION;  Surgeon: Irving Copas., MD;  Location: [facility] ENDOSCOPY;  Service: Gastroenterology;;   TEE WITHOUT CARDIOVERSION N/A 07/06/2015   Procedure: TRANSESOPHAGEAL ECHOCARDIOGRAM (TEE);  Surgeon: Ivin Poot, MD;  Location: [place];  Service: Open Heart Surgery;  Laterality: N/A;   Social History   Occupational History   Occupation: Retired  Tobacco Use   Smoking status: Former   Smokeless tobacco: Former    Types: Chew    Quit date: 1989  Vaping Use   Vaping Use: Never used  Substance and Sexual Activity   Alcohol use: No   Drug use: No   Sexual activity: Not Currently

## 2020-11-12 NOTE — Telephone Encounter (Signed)
Per Dr. [name], Order faxed to [facility] to continue Skilled Nursing, 1wk3. Scanned to [employer]  Sent: 11/12/2020   8:50 AM EDT To: Oliver Pila Subject: RE: [place] Sorry for the delay. I had to wait on the RN seeing the pt to review and reply. If you can have the MD enter an order to "continue [facility] visits for 1wk3" then that will get [place] through our current certification period of 12/11/20. Then we can reassess needs at that time.   Thanks.   Angie  ----- Message ----- From: Oliver Pila Sent: 11/11/2020  12:57 PM EDT To: Patsy Baltimore Subject: RE: [place]                      Thanks!  ----- Message ----- From: Patsy Baltimore Sent: 11/11/2020  12:46 PM EDT To: Jana Half, Oliver Pila, * Subject: RE: [place] No worries we are here to help. I have sent a message to our clinical team seeing this pt to check on what is needed to continue. I will let you know soon as I hear back from them.

## 2020-11-13 ENCOUNTER — Telehealth: Payer: Self-pay

## 2020-11-13 LAB — URINALYSIS, ROUTINE W REFLEX MICROSCOPIC
Bacteria, UA: NONE SEEN /HPF
Bilirubin Urine: NEGATIVE
Glucose, UA: NEGATIVE
Hgb urine dipstick: NEGATIVE
Hyaline Cast: NONE SEEN /LPF
Ketones, ur: NEGATIVE
Leukocytes,Ua: NEGATIVE
Nitrite: NEGATIVE
RBC / HPF: NONE SEEN /HPF (ref 0–2)
Specific Gravity, Urine: 1.015 (ref 1.001–1.035)
Squamous Epithelial / HPF: NONE SEEN /HPF (ref <=5)
WBC, UA: NONE SEEN /HPF (ref 0–5)
pH: 6.5 (ref 5.0–8.0)

## 2020-11-13 LAB — URINE CULTURE
MICRO NUMBER:: 12115190
SPECIMEN QUALITY:: ADEQUATE

## 2020-11-13 NOTE — Telephone Encounter (Signed)
SW scheduled initial palliative care visit with patient's wife. Visit is scheduled for 11/16/20 @ 11 am.

## 2020-11-14 ENCOUNTER — Other Ambulatory Visit: Payer: Self-pay | Admitting: Internal Medicine

## 2020-11-14 DIAGNOSIS — N3000 Acute cystitis without hematuria: Secondary | ICD-10-CM

## 2020-11-14 MED ORDER — AMOXICILLIN 500 MG PO CAPS
ORAL_CAPSULE | ORAL | 0 refills | Status: AC
Start: 2020-11-14 — End: ?

## 2020-11-14 NOTE — Progress Notes (Signed)
============================================================ ============================================================  -    U/C is (+) Positive for a strep infection   - Sent In a Rx for Amoxil 3 x /d for 2 weeks   - then need a Nurse visit 2 weeks after finish Abx to recheck Urine  ============================================================ ============================================================

## 2020-11-16 ENCOUNTER — Other Ambulatory Visit: Payer: Medicare Other | Admitting: *Deleted

## 2020-11-16 ENCOUNTER — Other Ambulatory Visit: Payer: Medicare Other

## 2020-11-16 ENCOUNTER — Other Ambulatory Visit: Payer: Self-pay

## 2020-11-16 VITALS — BP 197/118 | HR 93 | Temp 97.6°F | Resp 16

## 2020-11-16 DIAGNOSIS — Z515 Encounter for palliative care: Secondary | ICD-10-CM

## 2020-11-16 NOTE — Progress Notes (Signed)
[facility] RN NOTE  PATIENT NAME: [name] DOB: [birth year] MRN: [id number]  PRIMARY CARE PROVIDER: [name], MD  RESPONSIBLE PARTY: Addison Naegeli (wife) Acct ID - Guarantor Home Phone Work Phone Relationship Acct Type  [id number] RODD, [phone]  Self P/F     [place], [place], [place] [id number]   Covid-19 Pre-screening Negative  PLAN OF CARE and INTERVENTION:  ADVANCE CARE PLANNING/GOALS OF CARE: Goal is for patient to remain at home with his wife and avoid hospitalizations. He has a DNR. PATIENT/CAREGIVER EDUCATION: Explained palliative care services, symptom management, safe mobility, s/s of infection DISEASE STATUS: Joint initial palliative care visit made with Katheren Puller, MSW. Met with patient, wife and granddaughter in the home. Upon arrival, patient is sitting up in the recliner awake and alert. He has a poor short term memory and is forgetful, but able to answer simple questions. He denies pain. No shortness of breath noted. Patient is currently being treated for a prostate infection with Amoxicillin 500 mg TID for 14 days. He recently completed a course of antibiotics for a UTI. Wife states that he has become weaker since his hospitalization last month from 10/07/20 - 10/07/20 for hypertensive urgency after a mechanical fall and another hospitalization right after from 10/08/20 - 10/12/20 after 2 episodes of syncope and fall at home. He is ambulatory some days using his walker and 1 person assistance, but most days is has difficulty just pivoting/moving his feet when he stands .He has a lift chair. His legs often becomes weak and he slides himself down the to floor. He is currently working with PT through [facility]. It has been extended for 3 more weeks. He has a session today at 12:30p. She will speak with PT about a hospital bed. He requires assistance with all ADLs, except feeding. She has noticed increased jerking  and generalized movements while patient is asleep. Unsure of cause. His blood pressure is elevated today at 197/118 pulse 93 sitting, and standing 175/100 pulse 101. Wife gives him 1/2 to 1 tablet of Olmesartan as needed, and gave 1/2 tablet during visit. He has a good appetite, but family is pushing him to drink more fluids. Denies dysphagia. Intermittent incontinence of both bowel and bladder and wears Depends. He naps often during the day, but is up about every 30-45 minutes at night to use the urinal. They are agreeable to future visits with Palliative care.    CODE STATUS: DNR ADVANCED DIRECTIVES: Y MOST FORM: yes PPS: 40%   PHYSICAL EXAM:   VITALS: Today's Vitals   11/16/20 1106  BP: (!) 197/118  Pulse: 93  Resp: 16  Temp: 97.6 F (36.4 C)  TempSrc: Temporal  SpO2: 90%  PainSc: 0-No pain    LUNGS: clear to auscultation  CARDIAC: Cor RRR EXTREMITIES: No edema SKIN:  Quarter sized crusted area noted to mid right back   NEURO:  Alert and oriented to person/place, poor short term memory, forgetful, generalized weakness, semi-ambulatory w/walker   (Duration of visit and documentation 75 minutes)   Daryl Eastern, RN BSN

## 2020-11-16 NOTE — Progress Notes (Signed)
Carelink Summary Report / Loop Recorder 

## 2020-11-16 NOTE — Progress Notes (Signed)
COMMUNITY PALLIATIVE CARE SW NOTE  PATIENT NAME: [name] DOB: 07-23-1933 MRN: [id number]  PRIMARY CARE PROVIDER: [name], MD  RESPONSIBLE PARTY:  Acct ID - Guarantor Home Phone Work Phone Relationship Acct Type  [id number] AVREY, HYSER* [phone]  Self P/F     [address], [place], [place] [address]     PLAN OF CARE and INTERVENTIONS:             GOALS OF CARE/ ADVANCE CARE PLANNING:  Goal is for patient to remain in home with his wife. His wife would also like to see patient smile. Patient is a DNR.  SOCIAL/EMOTIONAL/SPIRITUAL ASSESSMENT/ INTERVENTIONS:  SW and RN-M. Nadara Mustard completed an initial visit with patient at his home. His wife/PCG-Evelyn and daughter was present with him. The team provided education to patient/family regarding palliative care services, role in patient's care, visit frequency, when to call and contact information. Patient's wife provided verbal and written consent to continue with palliative services. Patient was sitting in his recliner awake and alert. He denied pain. Patient was engaged when prompted, but appeared to defer to his wife and daughter to provide information. His wife provided by a status update on patient. Patient is currently being treated for a bacteria prostate infection with ABT for the next 14 days. Patient has had previous infections where he actually passed blood. His wife is encouraging fluid (intake) with patient, but that has been difficult as patient does not like water. Patient does not have any shortness of breath. He uses his walker to stand and pivot. Patient is only able to take a few steps, before his legs (Right leg) would give out on him. He has not had any falls, but several instances where his legs gave out and slid down to the floor, where patient's son-in-law or the fire department was called to get patient up.  His wife report that the weakness in his legs have been more persistent since his last  hospitalization. Patient has pain to his back along with increased blood pressures. His blood pressures have been inconsistent, but running hight. He has intermittent dizziness, particularly when he stands.His blood pressure does drop when he stands. Patient has had COVID and several TIA's that has his wife and daughter has contributed to his decline. Patient has swelling has to his feet as well. His wife does all of his personal care and ADL's, with occasional assistance from his daughter. His appetite is good and he has no swallowing issues and he is able to feed himself. Patient's wife describe the nighttime as being the hardest time for her and patient. She reports that patient is up, urinating every 30-45 minutes, and is a struggle getting him up and down. He does use the urinal or the bedside commode when appropriate. Patient is incontinent of bowel and bladder. Patient naps on and off throughout the day. His wife notes that when he is sleeping/napping, patient jerks, has other involuntary movements and mumbling nonsensically that has been more profound in the past two weeks.  SOCIAL HISTORY: Patient was born and raised in the [place] area. He completed high school. 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 Patient worked and retired from [employer] after 30 years. He has been married to his wife-Evelyn for 37 years and he has two children. 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 Patient has a living will, DNR (form present in the home) and his wife serve  as his POA/HCPOA. Patient will receive an additional 3 weeks of therapy through [facility]. Patient also receives a visit 1x month through his insurance, [employer]. His wife would like assistance with getting a hospital bed, Depends and chux pads.SW encouraged his wife to discuss hospital bed need with physical therapist and patient's primary care physician.   PATIENT/CAREGIVER EDUCATION/ COPING:  Patient appears content. He has some memory issues. SW provided emotional support to his wife, while normalizing her feelings regarding caregiver fatigue while encouraging self care. SW discussed her hiring additional in-home care, which she stated that she will take into consideration. SW will seek donated incontinent items to assist family. PERSONAL EMERGENCY PLAN:  911 can be activated for emergencies COMMUNITY RESOURCES COORDINATION/ HEALTH CARE NAVIGATION:  Patient is receiving PT through [facility]; also 1x month RN visits through [employer]. FINANCIAL/LEGAL CONCERNS/INTERVENTIONS:  None.     SOCIAL HX:  Social History   Tobacco Use   Smoking status: Former   Smokeless tobacco: Former    Types: Chew    Quit date: 1989  Substance Use Topics   Alcohol use: No    CODE STATUS: DNR ADVANCED DIRECTIVES: Yes MOST FORM COMPLETE:  Yes HOSPICE EDUCATION PROVIDED: Yes  PPS: Patient is alert and oriented to self and situation. He has increased weakness, blood pressures, and dizziness. Patient is currently being treated for an infection. He has memory impairments.   Duration of visit and documentation: 60 minutes.    [address] [place], [place]

## 2020-11-17 ENCOUNTER — Encounter (HOSPITAL_COMMUNITY): Payer: Self-pay

## 2020-11-17 ENCOUNTER — Emergency Department (HOSPITAL_COMMUNITY): Payer: Medicare Other

## 2020-11-17 ENCOUNTER — Other Ambulatory Visit: Payer: Self-pay | Admitting: Internal Medicine

## 2020-11-17 ENCOUNTER — Inpatient Hospital Stay (HOSPITAL_COMMUNITY)
Admission: EM | Admit: 2020-11-17 | Discharge: 2020-11-30 | DRG: 177 | Disposition: E | Payer: Medicare Other | Attending: Internal Medicine | Admitting: Internal Medicine

## 2020-11-17 DIAGNOSIS — E7800 Pure hypercholesterolemia, unspecified: Secondary | ICD-10-CM | POA: Diagnosis present

## 2020-11-17 DIAGNOSIS — Z8673 Personal history of transient ischemic attack (TIA), and cerebral infarction without residual deficits: Secondary | ICD-10-CM

## 2020-11-17 DIAGNOSIS — N419 Inflammatory disease of prostate, unspecified: Secondary | ICD-10-CM | POA: Diagnosis present

## 2020-11-17 DIAGNOSIS — Z87891 Personal history of nicotine dependence: Secondary | ICD-10-CM

## 2020-11-17 DIAGNOSIS — N390 Urinary tract infection, site not specified: Secondary | ICD-10-CM | POA: Diagnosis present

## 2020-11-17 DIAGNOSIS — M549 Dorsalgia, unspecified: Secondary | ICD-10-CM | POA: Diagnosis present

## 2020-11-17 DIAGNOSIS — R4182 Altered mental status, unspecified: Secondary | ICD-10-CM | POA: Diagnosis not present

## 2020-11-17 DIAGNOSIS — G8929 Other chronic pain: Secondary | ICD-10-CM | POA: Diagnosis present

## 2020-11-17 DIAGNOSIS — I714 Abdominal aortic aneurysm, without rupture: Secondary | ICD-10-CM | POA: Diagnosis present

## 2020-11-17 DIAGNOSIS — E559 Vitamin D deficiency, unspecified: Secondary | ICD-10-CM | POA: Diagnosis present

## 2020-11-17 DIAGNOSIS — Z993 Dependence on wheelchair: Secondary | ICD-10-CM

## 2020-11-17 DIAGNOSIS — Z79890 Hormone replacement therapy (postmenopausal): Secondary | ICD-10-CM

## 2020-11-17 DIAGNOSIS — I1310 Hypertensive heart and chronic kidney disease without heart failure, with stage 1 through stage 4 chronic kidney disease, or unspecified chronic kidney disease: Secondary | ICD-10-CM | POA: Diagnosis present

## 2020-11-17 DIAGNOSIS — J9601 Acute respiratory failure with hypoxia: Secondary | ICD-10-CM | POA: Diagnosis present

## 2020-11-17 DIAGNOSIS — U071 COVID-19: Secondary | ICD-10-CM | POA: Diagnosis not present

## 2020-11-17 DIAGNOSIS — Z881 Allergy status to other antibiotic agents status: Secondary | ICD-10-CM

## 2020-11-17 DIAGNOSIS — Z86010 Personal history of colonic polyps: Secondary | ICD-10-CM

## 2020-11-17 DIAGNOSIS — Z7982 Long term (current) use of aspirin: Secondary | ICD-10-CM

## 2020-11-17 DIAGNOSIS — I2510 Atherosclerotic heart disease of native coronary artery without angina pectoris: Secondary | ICD-10-CM | POA: Diagnosis present

## 2020-11-17 DIAGNOSIS — D696 Thrombocytopenia, unspecified: Secondary | ICD-10-CM

## 2020-11-17 DIAGNOSIS — R54 Age-related physical debility: Secondary | ICD-10-CM | POA: Diagnosis present

## 2020-11-17 DIAGNOSIS — N179 Acute kidney failure, unspecified: Secondary | ICD-10-CM | POA: Diagnosis present

## 2020-11-17 DIAGNOSIS — J690 Pneumonitis due to inhalation of food and vomit: Secondary | ICD-10-CM | POA: Diagnosis present

## 2020-11-17 DIAGNOSIS — J1282 Pneumonia due to coronavirus disease 2019: Secondary | ICD-10-CM | POA: Diagnosis present

## 2020-11-17 DIAGNOSIS — E538 Deficiency of other specified B group vitamins: Secondary | ICD-10-CM | POA: Diagnosis present

## 2020-11-17 DIAGNOSIS — F0150 Vascular dementia without behavioral disturbance: Secondary | ICD-10-CM | POA: Diagnosis present

## 2020-11-17 DIAGNOSIS — Z951 Presence of aortocoronary bypass graft: Secondary | ICD-10-CM

## 2020-11-17 DIAGNOSIS — K589 Irritable bowel syndrome without diarrhea: Secondary | ICD-10-CM | POA: Diagnosis present

## 2020-11-17 DIAGNOSIS — I723 Aneurysm of iliac artery: Secondary | ICD-10-CM | POA: Diagnosis present

## 2020-11-17 DIAGNOSIS — B952 Enterococcus as the cause of diseases classified elsewhere: Secondary | ICD-10-CM | POA: Diagnosis present

## 2020-11-17 DIAGNOSIS — E1122 Type 2 diabetes mellitus with diabetic chronic kidney disease: Secondary | ICD-10-CM | POA: Diagnosis present

## 2020-11-17 DIAGNOSIS — D539 Nutritional anemia, unspecified: Secondary | ICD-10-CM

## 2020-11-17 DIAGNOSIS — E039 Hypothyroidism, unspecified: Secondary | ICD-10-CM | POA: Diagnosis present

## 2020-11-17 DIAGNOSIS — K219 Gastro-esophageal reflux disease without esophagitis: Secondary | ICD-10-CM | POA: Diagnosis present

## 2020-11-17 DIAGNOSIS — Z8711 Personal history of peptic ulcer disease: Secondary | ICD-10-CM

## 2020-11-17 DIAGNOSIS — Z833 Family history of diabetes mellitus: Secondary | ICD-10-CM

## 2020-11-17 DIAGNOSIS — Z515 Encounter for palliative care: Secondary | ICD-10-CM

## 2020-11-17 DIAGNOSIS — G9341 Metabolic encephalopathy: Secondary | ICD-10-CM | POA: Diagnosis present

## 2020-11-17 DIAGNOSIS — Z79899 Other long term (current) drug therapy: Secondary | ICD-10-CM

## 2020-11-17 DIAGNOSIS — Z791 Long term (current) use of non-steroidal anti-inflammatories (NSAID): Secondary | ICD-10-CM

## 2020-11-17 DIAGNOSIS — R29810 Facial weakness: Secondary | ICD-10-CM | POA: Diagnosis present

## 2020-11-17 DIAGNOSIS — Z888 Allergy status to other drugs, medicaments and biological substances status: Secondary | ICD-10-CM

## 2020-11-17 DIAGNOSIS — N1832 Chronic kidney disease, stage 3b: Secondary | ICD-10-CM | POA: Diagnosis present

## 2020-11-17 DIAGNOSIS — R0989 Other specified symptoms and signs involving the circulatory and respiratory systems: Secondary | ICD-10-CM

## 2020-11-17 DIAGNOSIS — N289 Disorder of kidney and ureter, unspecified: Secondary | ICD-10-CM

## 2020-11-17 DIAGNOSIS — Z66 Do not resuscitate: Secondary | ICD-10-CM | POA: Diagnosis present

## 2020-11-17 DIAGNOSIS — Z8249 Family history of ischemic heart disease and other diseases of the circulatory system: Secondary | ICD-10-CM

## 2020-11-17 LAB — COMPREHENSIVE METABOLIC PANEL
ALT: 9 U/L (ref 0–44)
AST: 20 U/L (ref 15–41)
Albumin: 3.3 g/dL — ABNORMAL LOW (ref 3.5–5.0)
Alkaline Phosphatase: 112 U/L (ref 38–126)
Anion gap: 10 (ref 5–15)
BUN: 33 mg/dL — ABNORMAL HIGH (ref 8–23)
CO2: 26 mmol/L (ref 22–32)
Calcium: 8.6 mg/dL — ABNORMAL LOW (ref 8.9–10.3)
Chloride: 106 mmol/L (ref 98–111)
Creatinine, Ser: 2.45 mg/dL — ABNORMAL HIGH (ref 0.61–1.24)
GFR, Estimated: 25 mL/min — ABNORMAL LOW (ref >60)
Glucose, Bld: 129 mg/dL — ABNORMAL HIGH (ref 70–99)
Potassium: 4.3 mmol/L (ref 3.5–5.1)
Sodium: 142 mmol/L (ref 135–145)
Total Bilirubin: 0.7 mg/dL (ref 0.3–1.2)
Total Protein: 6.3 g/dL — ABNORMAL LOW (ref 6.5–8.1)

## 2020-11-17 LAB — BLOOD GAS, ARTERIAL
Acid-Base Excess: 2.1 mmol/L — ABNORMAL HIGH (ref 0.0–2.0)
Bicarbonate: 26.5 mmol/L (ref 20.0–28.0)
O2 Saturation: 97 %
Patient temperature: 97.6
pCO2 arterial: 41.9 mmHg (ref 32.0–48.0)
pH, Arterial: 7.415 (ref 7.350–7.450)
pO2, Arterial: 88 mmHg (ref 83.0–108.0)

## 2020-11-17 LAB — PROTIME-INR
INR: 1.1 (ref 0.8–1.2)
Prothrombin Time: 13.8 seconds (ref 11.4–15.2)

## 2020-11-17 LAB — CBC
HCT: 34.1 % — ABNORMAL LOW (ref 39.0–52.0)
Hemoglobin: 10.5 g/dL — ABNORMAL LOW (ref 13.0–17.0)
MCH: 32.7 pg (ref 26.0–34.0)
MCHC: 30.8 g/dL (ref 30.0–36.0)
MCV: 106.2 fL — ABNORMAL HIGH (ref 80.0–100.0)
Platelets: 144 10*3/uL — ABNORMAL LOW (ref 150–400)
RBC: 3.21 MIL/uL — ABNORMAL LOW (ref 4.22–5.81)
RDW: 16.9 % — ABNORMAL HIGH (ref 11.5–15.5)
WBC: 7.6 10*3/uL (ref 4.0–10.5)
nRBC: 0 % (ref 0.0–0.2)

## 2020-11-17 MED ORDER — QUETIAPINE FUMARATE 25 MG PO TABS: ORAL_TABLET | ORAL | 2 refills | Status: AC

## 2020-11-17 MED ORDER — LABETALOL HCL 5 MG/ML IV SOLN
20.0000 mg | Freq: Once | INTRAVENOUS | Status: AC
  Administered 2020-11-18: 20 mg via INTRAVENOUS
  Filled 2020-11-17: qty 4

## 2020-11-17 NOTE — ED Triage Notes (Addendum)
Pt to ED by EMS from home with c/o AMS which began 2-3 days ago and has been getting progressively worse. Pt with recent hx of covid and a "prostate infection" (per EMS). Arrives oriented to person only. Pt also reported to have started taking tramadol recently for chronic  back pain.Arrives with a slight notable tremor.

## 2020-11-17 NOTE — ED Provider Notes (Signed)
[facility] DEPT Provider Note   CSN: [id number] Arrival date & time: 11/19/2020  2103     History Chief Complaint  Patient presents with   Altered Mental Status    [name] is a 87 y.o. male.  [place] resents with his wife.  His usual functional status is wheelchair-bound with minimal ambulation with a walker.  He was started on tramadol last week by his orthopedist.  He has also been treated for a recent UTI and possible prostatitis.  2 days ago, the patient's wife noted that he became less responsive.  He was drinking all extremities.  He was hallucinating.  He had difficulty feeding himself today, and his wife thinks that he might be weak on the right side.  The history is provided by the spouse. The history is limited by the condition of the patient.  Altered Mental Status Presenting symptoms: lethargy and partial responsiveness   Severity:  Severe Most recent episode:  Today Episode history:  Continuous Duration:  2 days Timing:  Constant Progression:  Unchanged Chronicity:  New Context: dementia, recent change in medication (started taking tramadol last week; took it at noon today) and recent illness (Had COVID-19 about 3 weeks ago; recent UTI/prostatitis now on Amoxicillin)   Associated symptoms: abnormal movement (twitching, shaking), decreased appetite and weakness (wife has noted subtle right-sided weakness)   Associated symptoms: no abdominal pain, no difficulty breathing, no fever, no palpitations, no rash, no seizures and no vomiting       Past Medical History:  Diagnosis Date   AAA (abdominal aortic aneurysm) ([place])    Aneurysm of iliac artery (HCC)    CAD (coronary artery disease)    a. s/p CABGx4 in 07/2015.   Carotid artery disease ([place])    Colon polyps    Diabetes ([place])    Difficult intubation    Duodenal ulcer disease 08/15/2018   Esophageal reflux    High cholesterol    History of IBS 02/27/2009    Hypertension    pt denies, he says he has a h/o hypotension. If BP up he adjusts the [place]   Jejunitis with partial SBO 05/20/2017   Orthostatic hypotension    Prostatitis    Stroke [facility])    TIA (transient ischemic attack)    Upper GI bleed 06/24/2018   Vitamin B 12 deficiency    Vitamin D deficiency     Patient Active Problem List   Diagnosis Date Noted   Syncope and collapse 10/08/2020   Acute renal failure superimposed on stage 3b chronic kidney disease ([place]) 10/08/2020   Memory loss 10/08/2020   Hypothyroid 10/08/2020   Hypertensive urgency 10/05/2020   Situational depression 01/22/2020   Urinary frequency 01/22/2020   Bacteremia due to Staphylococcus epidermidis 11/24/2019   Stroke (cerebrum) (HCC)    Elevated troponin    Closed right hip fracture ([place]) 11/15/2019   Unilateral primary osteoarthritis, right knee 11/13/2019   At high risk for falls 05/29/2019   Chronic bilateral low back pain without sciatica 02/07/2019   Statin intolerance 11/16/2018   Hyperlipidemia, mixed 08/15/2018   Low back pain 07/18/2018   History of lacunar cerebrovascular accident (CVA) 02/12/2018   Dizziness 02/12/2018   Myofascial pain 12/18/2017   Spondylosis without myelopathy or radiculopathy, lumbar region 12/18/2017   Lumbar radiculopathy, right 12/18/2017   AKI (acute kidney injury) ([place])    Cholelithiasis 05/17/2017   Sinus Bradycardia 05/17/2017   Thoracic radiculopathy 04/12/2017   Primary osteoarthritis of right  knee 12/14/2016   DDD (degenerative disc disease), lumbar 10/10/2016   Lumbar facet arthropathy 08/29/2016   Idiopathic scoliosis 03/22/2016   Diabetic neuropathy ([place]) 03/22/2016   Dysautonomia orthostatic hypotension syndrome 02/25/2016   CKD stage 3 due to type 2 diabetes mellitus ([place]) 02/19/2016   Coronary artery disease involving coronary bypass graft of native heart without angina pectoris    Diabetes mellitus type 2, diet-controlled ([place])    Intercostal  neuralgia 12/24/2015   Medication management 08/11/2015   S/P CABG x 4 07/06/2015   PVD (peripheral vascular disease) ([place]) 01/01/2014   Hyperlipidemia associated with type 2 diabetes mellitus ([place]) 04/11/2013   HTN (hypertension)    Vitamin D deficiency    Vitamin B 12 deficiency    Coronary atherosclerosis- s/p PCI to LAD in 2009 and PCI to RCA in 2011 02/27/2009   Abdominal aortic aneurysm ([place]) 02/27/2009   GERD 02/27/2009    Past Surgical History:  Procedure Laterality Date   BIOPSY  06/25/2018   Procedure: BIOPSY;  Surgeon: Irving Copas., MD;  Location: [facility];  Service: Gastroenterology;;   BIOPSY  04/24/2019   Procedure: BIOPSY;  Surgeon: Irving Copas., MD;  Location: [facility];  Service: Gastroenterology;;   BLADDER SURGERY  1969   traumatic pelvic fractures, urethral and bladder repair   CARDIAC CATHETERIZATION N/A 07/01/2015   Procedure: Left Heart Cath and Coronary Angiography;  Surgeon: Wellington Hampshire, MD;  Location: [facility];  Service: Cardiovascular;  Laterality: N/A;   COLON RESECTION N/A 05/17/2017   Procedure: DIAGNOSTIC LAPAROSCOPY,;  Surgeon: Leighton Ruff, MD;  Location: [facility];  Service: General;  Laterality: N/A;   COLONOSCOPY WITH PROPOFOL N/A 04/24/2019   Procedure: COLONOSCOPY WITH PROPOFOL;  Surgeon: Irving Copas., MD;  [facility]: [facility];  Service: Gastroenterology;  Laterality: N/A;   CORONARY ANGIOPLASTY WITH STENT PLACEMENT     CORONARY ARTERY BYPASS GRAFT N/A 07/06/2015   Procedure: CORONARY ARTERY BYPASS GRAFTING (CABG)x 4   utilizing the left internal mammary artery and endoscopically harvested bilateral  sapheneous vein.;  Surgeon: Ivin Poot, MD;  Location: [facility];  Service: Open Heart Surgery;  Laterality: N/A;   ESOPHAGEAL DILATION  04/24/2019   Procedure: ESOPHAGEAL DILATION;  Surgeon: Rush Landmark Telford Nab., MD;  Location: [facility];  Service: Gastroenterology;;    ESOPHAGOGASTRODUODENOSCOPY (EGD) WITH PROPOFOL N/A 06/25/2018   Procedure: ESOPHAGOGASTRODUODENOSCOPY (EGD) WITH PROPOFOL;  Surgeon: Irving Copas., MD;  Location: [facility];  Service: Gastroenterology;  Laterality: N/A;   ESOPHAGOGASTRODUODENOSCOPY (EGD) WITH PROPOFOL N/A 04/24/2019   Procedure: ESOPHAGOGASTRODUODENOSCOPY (EGD) WITH PROPOFOL;  Surgeon: Rush Landmark Telford Nab., MD;  [facility];  Service: Gastroenterology;  Laterality: N/A;   FEMUR IM NAIL Right 11/16/2019   Procedure: RIGHT HIP INTRAMEDULLARY (IM) NAIL FEMORAL;  Surgeon: Meredith Pel, MD;  Location: [facility];  Service: Orthopedics;  Laterality: Right;   HEMOSTASIS CLIP PLACEMENT  04/24/2019   Procedure: HEMOSTASIS CLIP PLACEMENT;  Surgeon: Irving Copas., MD;  Location: [facility];  Service: Gastroenterology;;   KNEE SURGERY     LOOP RECORDER INSERTION N/A 02/19/2018   Procedure: LOOP RECORDER INSERTION;  Surgeon: Deboraha Sprang, MD;  Location: [facility];  Service: Cardiovascular;  Laterality: N/A;   POLYPECTOMY  04/24/2019   Procedure: POLYPECTOMY;  Surgeon: Rush Landmark Telford Nab., MD;  Location[facility];  Service: Gastroenterology;;   SUBMUCOSAL LIFTING INJECTION  04/24/2019   Procedure: SUBMUCOSAL LIFTING INJECTION;  Surgeon: Irving Copas., MD;  [facility];  Service:  Gastroenterology;;   TEE WITHOUT CARDIOVERSION N/A 07/06/2015   Procedure: TRANSESOPHAGEAL ECHOCARDIOGRAM (TEE);  Surgeon: Ivin Poot, MD;  Location: [facility];  Service: Open Heart Surgery;  Laterality: N/A;       Family History  Problem Relation Age of Onset   Heart attack Father        died age 21   Heart attack Brother        died age 72   Anuerysm Brother        aortic   Heart attack Sister        died age 75   Colon cancer Sister    Liver cancer Sister    Diabetes Maternal Grandmother    Arthritis Mother    Dementia Mother    Heart Problems Brother    Heart Problems  Brother    Heart Problems Brother    Heart Problems Brother    Healthy Daughter    Healthy Son    Esophageal cancer Neg Hx    Inflammatory bowel disease Neg Hx    Pancreatic cancer Neg Hx    Stomach cancer Neg Hx     Social History   Tobacco Use   Smoking status: Former   Smokeless tobacco: Former    Types: Chew    Quit date: 1989  Vaping Use   Vaping Use: Never used  Substance Use Topics   Alcohol use: No   Drug use: No    Home Medications Prior to Admission medications   Medication Sig Start Date End Date Taking? Authorizing Provider  acetaminophen (TYLENOL) 500 MG tablet Take 1,000 mg by mouth in the morning and at bedtime.    [provider]  amoxicillin (AMOXIL) 500 MG capsule Take  1 capsule  3 x day  after Meals for Prostate Infection 11/14/20   [name], MD  aspirin EC 81 MG tablet Take 1 tablet (81 mg total) by mouth daily. 10/12/20   [place] Eric J, DO  buPROPion (WELLBUTRIN XL) 150 MG 24 hr tablet Take 1 tablet (150 mg total) by mouth daily. 10/12/20   [place] Donnamarie Poag, DO  butalbital-acetaminophen-caffeine (FIORICET) [phone] MG tablet Take 1 tablet by mouth every 4 (four) hours as needed for headache. 10/12/20   [place] Donnamarie Poag, DO  Calcium Carbonate-Vit D-Min (CALCIUM 1200 PO) Take 1 tablet by mouth daily.    [provider]  Cholecalciferol (VITAMIN D-3) 5000 units TABS Take 5,000 Units by mouth daily.    [provider]  Cyanocobalamin (VITAMIN B-12) 3000 MCG SUBL Place 1 tablet under the tongue daily.    [provider]  ferrous sulfate 325 (65 FE) MG tablet Take 325 mg by mouth daily with breakfast.    [provider]  fludrocortisone (FLORINEF) 0.1 MG tablet Take  1 tablet 2 x /day - Morning & Night  - for Low BP 11/05/20   [name], MD  gabapentin (NEURONTIN) 100 MG capsule Take 100 mg by mouth 3 (three) times daily.    [provider]  glucose blood test strip 1 each by Other route as needed.  Check blood sugar 1 time daily-DX-E11.9 10/12/20   [place] Donnamarie Poag, DO  levothyroxine (SYNTHROID) 25 MCG tablet Take 1 tablet (25 mcg total) by mouth daily before breakfast. 10/12/20   [place] Donnamarie Poag, DO  meloxicam (MOBIC) 15 MG tablet Take 0.5-1 tablets (7.5-15 mg total) by mouth daily. 10/12/20   [place] Eric J, DO  memantine (NAMENDA) 10 MG tablet Take 1 tablet (  10 mg total) by mouth 2 (two) times daily. 10/12/20   [place] Donnamarie Poag, DO  midodrine (PROAMATINE) 5 MG tablet Take  1 tablet  3 x /day  with Meals for Low BP 11/05/20   [name], MD  Multiple Vitamin (MULTIVITAMIN WITH MINERALS) TABS tablet Take 1 tablet by mouth daily.    [provider]  olmesartan (BENICAR) 40 MG tablet Take 20-40 mg by mouth daily as needed (high blood pressure).    [provider]  pantoprazole (PROTONIX) 40 MG tablet Take 1 tablet (40 mg total) by mouth daily. 10/12/20   [place] Donnamarie Poag, DO  Potassium 99 MG TABS Take 1 tablet by mouth daily.    [provider]  pravastatin (PRAVACHOL) 40 MG tablet Take 1 tablet (40 mg total) by mouth daily. 10/12/20   [place] Donnamarie Poag, DO  QUEtiapine (SEROQUEL) 25 MG tablet Take 1 tablet 3 x /day for Agitation / Hallucinations 11/29/2020   [name], MD  Sennosides (SENOKOT PO) Take by mouth.    [provider]  traMADol-acetaminophen (ULTRACET) 37.5-325 MG tablet Take 1 tablet by mouth every 6 (six) hours as needed. 11/12/20   [name], MD  Zinc 50 MG TABS Take 1 tablet by mouth daily.    [provider]    Allergies    Other, Cymbalta [duloxetine hcl], Keflex [cephalexin], Simvastatin, and Sudafed [pseudoephedrine]  Review of Systems   Review of Systems  Constitutional:  Positive for appetite change and decreased appetite. Negative for chills and fever.  HENT:  Negative for ear pain and sore throat.   Eyes:  Negative for pain and visual disturbance.  Respiratory:  Negative for cough and shortness of breath.    Cardiovascular:  Negative for chest pain and palpitations.  Gastrointestinal:  Negative for abdominal pain and vomiting.  Genitourinary:  Negative for dysuria and hematuria.  Musculoskeletal:  Negative for arthralgias and back pain.  Skin:  Negative for color change and rash.  Neurological:  Positive for weakness (wife has noted subtle right-sided weakness). Negative for seizures and syncope.  All other systems reviewed and are negative.  Physical Exam Updated Vital Signs BP (!) 191/119   Pulse 81   Resp 15   SpO2 97%   Physical Exam Vitals and nursing note reviewed.  Constitutional:      Appearance: Normal appearance.  HENT:     Head: Normocephalic and atraumatic.  Cardiovascular:     Rate and Rhythm: Normal rate and regular rhythm.     Heart sounds: Normal heart sounds.  Pulmonary:     Effort: Pulmonary effort is normal.     Breath sounds: Normal breath sounds.  Abdominal:     General: There is no distension.     Tenderness: There is no abdominal tenderness. There is no guarding.  Musculoskeletal:     Cervical back: Normal range of motion.     Right lower leg: No edema.     Left lower leg: No edema.  Skin:    General: Skin is warm and dry.  Neurological:     General: No focal deficit present.     Mental Status: He is oriented to person, place, and time. He is lethargic.     GCS: GCS eye subscore is 3. GCS verbal subscore is 5. GCS motor subscore is 6.     Cranial Nerves: Cranial nerves are intact.     Sensory: Sensory deficit present.     Comments: Decreased sensation to light touch over  both lower extremities (wife attributes to neuropathy) Very subtle drift of right upper extremity Right lower extremity drifts to bed  He is having intermittent, myoclonic jerks  Psychiatric:        Mood and Affect: Mood normal.        Behavior: Behavior normal.    ED Results / Procedures / Treatments   Labs (all labs ordered are listed, but only abnormal results are  displayed) Labs Reviewed  RESP PANEL BY RT-PCR (FLU A&B, COVID) ARPGX2 - Abnormal; Notable for the following components:      Result Value   SARS Coronavirus 2 by RT PCR POSITIVE (*)    All other components within normal limits  COMPREHENSIVE METABOLIC PANEL - Abnormal; Notable for the following components:   Glucose, Bld 129 (*)    BUN 33 (*)    Creatinine, Ser 2.45 (*)    Calcium 8.6 (*)    Total Protein 6.3 (*)    Albumin 3.3 (*)    GFR, Estimated 25 (*)    All other components within normal limits  CBC - Abnormal; Notable for the following components:   RBC 3.21 (*)    Hemoglobin 10.5 (*)    HCT 34.1 (*)    MCV 106.2 (*)    RDW 16.9 (*)    Platelets 144 (*)    All other components within normal limits  URINALYSIS, ROUTINE W REFLEX MICROSCOPIC - Abnormal; Notable for the following components:   Protein, ur 30 (*)    All other components within normal limits  BLOOD GAS, ARTERIAL - Abnormal; Notable for the following components:   Acid-Base Excess 2.1 (*)    All other components within normal limits  VITAMIN B12 - Abnormal; Notable for the following components:   Vitamin B-12 2,471 (*)    All other components within normal limits  GLUCOSE, CAPILLARY - Abnormal; Notable for the following components:   Glucose-Capillary 141 (*)    All other components within normal limits  CBG MONITORING, ED - Abnormal; Notable for the following components:   Glucose-Capillary 123 (*)    All other components within normal limits  URINE CULTURE  PROTIME-INR  FOLATE  VITAMIN B1  HEMOGLOBIN A1C  LIPID PANEL  CBC WITH DIFFERENTIAL/PLATELET  COMPREHENSIVE METABOLIC PANEL  C-REACTIVE PROTEIN  D-DIMER, QUANTITATIVE  FERRITIN    EKG EKG Interpretation  Date/Time:  Tuesday November 17 2020 21:19:56 EDT Ventricular Rate:  89 PR Interval:  224 QRS Duration: 117 QT Interval:  380 QTC Calculation: 463 R Axis:   107 Text Interpretation: Sinus or ectopic atrial rhythm Prolonged PR interval  Incomplete right bundle branch block T waves inversed in V1-V3, new from prior Confirmed by Lorre Munroe ([id number]) on 11/02/2020 11:24:24 PM  Radiology CT Head Wo Contrast  Result Date: 11/08/2020 CLINICAL DATA:  Mental status change EXAM: CT HEAD WITHOUT CONTRAST TECHNIQUE: Contiguous axial images were obtained from the base of the skull through the vertex without intravenous contrast. COMPARISON:  CT brain 10/08/2020 FINDINGS: Brain: No acute territorial infarction, hemorrhage, or intracranial mass. There is motion degradation. Atrophy and extensive white matter disease. Chronic infarcts within the bilateral white matter, left basal ganglia and bilateral thalamus. Small chronic infarct in the right cerebellum. Stable ventricle size. Vascular: No hyperdense vessel.  Carotid vascular calcification Skull: Normal. Negative for fracture or focal lesion. Sinuses/Orbits: Mucosal thickening or cyst in the left frontal sinus. Other: None IMPRESSION: 1. Slight motion degradation limits the exam 2. Atrophy and extensive white matter disease. Multiple small  chronic infarcts. No definite CT evidence for acute intracranial abnormality Electronically Signed   By: Donavan Foil M.D.   On: 11/17/2020 22:33   DG Chest Port 1 View  Result Date: 11/29/2020 CLINICAL DATA:  Altered mental status EXAM: PORTABLE CHEST 1 VIEW COMPARISON:  10/05/2020 FINDINGS: Post sternotomy changes. Electronic recording device over the left chest. Cardiomegaly. Asymmetric interstitial and ground-glass opacity throughout the right thorax, favor diffuse pneumonia over asymmetric edema. No pleural effusion or pneumothorax. IMPRESSION: 1. Asymmetric diffuse interstitial and ground-glass opacity in the right thorax favored to represent diffuse pneumonia over asymmetric edema 2. Cardiomegaly Electronically Signed   By: Donavan Foil M.D.   On: 11/16/2020 22:34    Procedures Procedures   Medications Ordered in ED Medications - No data to display  ED  Course  I have reviewed the triage vital signs and the nursing notes.  Pertinent labs & imaging results that were available during my care of the patient were reviewed by me and considered in my medical decision making (see chart for details).    MDM Rules/Calculators/A&P                           JERIMY JOHANSON has multiple chronic medical conditions and a low functional baseline.  He was brought in for altered mental status, atypical myoclonic jerks.  He has recently had COVID-19 and also been treated for a urinary tract infection.  He was evaluated for evidence of acute neurologic pathology, infectious etiology of symptoms, metabolic pathology. Final Clinical Impression(s) / ED Diagnoses Final diagnoses:  Altered mental status, unspecified altered mental status type  COVID-19 virus infection  Renal insufficiency  Macrocytic anemia  Thrombocytopenia ([place])    Rx / DC Orders ED Discharge Orders     None        Arnaldo Natal, MD 11/19/20 [phone]

## 2020-11-18 ENCOUNTER — Other Ambulatory Visit: Payer: Self-pay

## 2020-11-18 ENCOUNTER — Inpatient Hospital Stay (HOSPITAL_COMMUNITY): Payer: Medicare Other

## 2020-11-18 DIAGNOSIS — N390 Urinary tract infection, site not specified: Secondary | ICD-10-CM | POA: Diagnosis present

## 2020-11-18 DIAGNOSIS — E1122 Type 2 diabetes mellitus with diabetic chronic kidney disease: Secondary | ICD-10-CM | POA: Diagnosis present

## 2020-11-18 DIAGNOSIS — Z66 Do not resuscitate: Secondary | ICD-10-CM | POA: Diagnosis present

## 2020-11-18 DIAGNOSIS — B952 Enterococcus as the cause of diseases classified elsewhere: Secondary | ICD-10-CM | POA: Diagnosis present

## 2020-11-18 DIAGNOSIS — R54 Age-related physical debility: Secondary | ICD-10-CM | POA: Diagnosis present

## 2020-11-18 DIAGNOSIS — Z8673 Personal history of transient ischemic attack (TIA), and cerebral infarction without residual deficits: Secondary | ICD-10-CM | POA: Diagnosis not present

## 2020-11-18 DIAGNOSIS — R4182 Altered mental status, unspecified: Secondary | ICD-10-CM | POA: Diagnosis present

## 2020-11-18 DIAGNOSIS — E039 Hypothyroidism, unspecified: Secondary | ICD-10-CM | POA: Diagnosis present

## 2020-11-18 DIAGNOSIS — Z951 Presence of aortocoronary bypass graft: Secondary | ICD-10-CM | POA: Diagnosis not present

## 2020-11-18 DIAGNOSIS — F0150 Vascular dementia without behavioral disturbance: Secondary | ICD-10-CM | POA: Diagnosis present

## 2020-11-18 DIAGNOSIS — J1282 Pneumonia due to coronavirus disease 2019: Secondary | ICD-10-CM | POA: Diagnosis present

## 2020-11-18 DIAGNOSIS — I714 Abdominal aortic aneurysm, without rupture: Secondary | ICD-10-CM | POA: Diagnosis present

## 2020-11-18 DIAGNOSIS — N1832 Chronic kidney disease, stage 3b: Secondary | ICD-10-CM | POA: Diagnosis present

## 2020-11-18 DIAGNOSIS — I723 Aneurysm of iliac artery: Secondary | ICD-10-CM | POA: Diagnosis present

## 2020-11-18 DIAGNOSIS — N179 Acute kidney failure, unspecified: Secondary | ICD-10-CM | POA: Diagnosis present

## 2020-11-18 DIAGNOSIS — U071 COVID-19: Secondary | ICD-10-CM | POA: Diagnosis present

## 2020-11-18 DIAGNOSIS — J9601 Acute respiratory failure with hypoxia: Secondary | ICD-10-CM | POA: Diagnosis present

## 2020-11-18 DIAGNOSIS — G9341 Metabolic encephalopathy: Secondary | ICD-10-CM | POA: Diagnosis present

## 2020-11-18 DIAGNOSIS — I2510 Atherosclerotic heart disease of native coronary artery without angina pectoris: Secondary | ICD-10-CM | POA: Diagnosis present

## 2020-11-18 DIAGNOSIS — N419 Inflammatory disease of prostate, unspecified: Secondary | ICD-10-CM | POA: Diagnosis present

## 2020-11-18 DIAGNOSIS — J690 Pneumonitis due to inhalation of food and vomit: Secondary | ICD-10-CM | POA: Diagnosis present

## 2020-11-18 DIAGNOSIS — Z515 Encounter for palliative care: Secondary | ICD-10-CM | POA: Diagnosis not present

## 2020-11-18 DIAGNOSIS — D539 Nutritional anemia, unspecified: Secondary | ICD-10-CM | POA: Diagnosis present

## 2020-11-18 DIAGNOSIS — I1310 Hypertensive heart and chronic kidney disease without heart failure, with stage 1 through stage 4 chronic kidney disease, or unspecified chronic kidney disease: Secondary | ICD-10-CM | POA: Diagnosis present

## 2020-11-18 DIAGNOSIS — D696 Thrombocytopenia, unspecified: Secondary | ICD-10-CM | POA: Diagnosis present

## 2020-11-18 LAB — URINALYSIS, ROUTINE W REFLEX MICROSCOPIC
Bacteria, UA: NONE SEEN
Bilirubin Urine: NEGATIVE
Glucose, UA: NEGATIVE mg/dL
Hgb urine dipstick: NEGATIVE
Ketones, ur: NEGATIVE mg/dL
Leukocytes,Ua: NEGATIVE
Nitrite: NEGATIVE
Protein, ur: 30 mg/dL — AB
Specific Gravity, Urine: 1.021 (ref 1.005–1.030)
pH: 5 (ref 5.0–8.0)

## 2020-11-18 LAB — VITAMIN B12: Vitamin B-12: 2471 pg/mL — ABNORMAL HIGH (ref 180–914)

## 2020-11-18 LAB — CBG MONITORING, ED: Glucose-Capillary: 123 mg/dL — ABNORMAL HIGH (ref 70–99)

## 2020-11-18 LAB — RESP PANEL BY RT-PCR (FLU A&B, COVID) ARPGX2
Influenza A by PCR: NEGATIVE
Influenza B by PCR: NEGATIVE
SARS Coronavirus 2 by RT PCR: POSITIVE — AB

## 2020-11-18 LAB — FOLATE: Folate: 11.6 ng/mL (ref >5.9)

## 2020-11-18 MED ORDER — ASPIRIN EC 81 MG PO TBEC
81.0000 mg | DELAYED_RELEASE_TABLET | Freq: Every day | ORAL | Status: DC
  Administered 2020-11-18 – 2020-11-22 (×4): 81 mg via ORAL
  Filled 2020-11-18 (×5): qty 1

## 2020-11-18 MED ORDER — FLUDROCORTISONE ACETATE 0.1 MG PO TABS
0.1000 mg | ORAL_TABLET | Freq: Two times a day (BID) | ORAL | Status: DC
  Administered 2020-11-18 – 2020-11-22 (×7): 0.1 mg via ORAL
  Filled 2020-11-18 (×9): qty 1

## 2020-11-18 MED ORDER — AMOXICILLIN 250 MG PO CAPS
500.0000 mg | ORAL_CAPSULE | Freq: Two times a day (BID) | ORAL | Status: DC
  Filled 2020-11-18: qty 2

## 2020-11-18 MED ORDER — ACETAMINOPHEN 325 MG PO TABS
650.0000 mg | ORAL_TABLET | ORAL | Status: DC | PRN
Start: 2020-11-18 — End: 2020-11-19
  Administered 2020-11-18 (×2): 650 mg via ORAL
  Filled 2020-11-18 (×2): qty 2

## 2020-11-18 MED ORDER — MEMANTINE HCL 10 MG PO TABS
10.0000 mg | ORAL_TABLET | Freq: Two times a day (BID) | ORAL | Status: DC
  Administered 2020-11-18 – 2020-11-22 (×8): 10 mg via ORAL
  Filled 2020-11-18 (×6): qty 1
  Filled 2020-11-18: qty 2
  Filled 2020-11-18: qty 1

## 2020-11-18 MED ORDER — ACETAMINOPHEN 160 MG/5ML PO SOLN: 650.0000 mg | ORAL | Status: DC | PRN

## 2020-11-18 MED ORDER — SODIUM CHLORIDE 0.9 % IV SOLN: Freq: Once | INTRAVENOUS | Status: AC

## 2020-11-18 MED ORDER — ACETAMINOPHEN 650 MG RE SUPP: 650.0000 mg | RECTAL | Status: DC | PRN

## 2020-11-18 MED ORDER — ADULT MULTIVITAMIN W/MINERALS CH
1.0000 | ORAL_TABLET | Freq: Every day | ORAL | Status: DC
  Administered 2020-11-18 – 2020-11-22 (×4): 1 via ORAL
  Filled 2020-11-18 (×4): qty 1

## 2020-11-18 MED ORDER — STROKE: EARLY STAGES OF RECOVERY BOOK
Freq: Once | Status: AC
  Filled 2020-11-18: qty 1

## 2020-11-18 MED ORDER — LEVOTHYROXINE SODIUM 25 MCG PO TABS
25.0000 ug | ORAL_TABLET | Freq: Every day | ORAL | Status: DC
  Administered 2020-11-21 – 2020-11-22 (×2): 25 ug via ORAL
  Filled 2020-11-18 (×2): qty 1

## 2020-11-18 MED ORDER — DEXAMETHASONE SODIUM PHOSPHATE 10 MG/ML IJ SOLN
6.0000 mg | INTRAMUSCULAR | Status: DC
  Administered 2020-11-18 – 2020-11-22 (×5): 6 mg via INTRAVENOUS
  Filled 2020-11-18 (×5): qty 1

## 2020-11-18 MED ORDER — PANTOPRAZOLE SODIUM 40 MG PO TBEC
40.0000 mg | DELAYED_RELEASE_TABLET | Freq: Every day | ORAL | Status: DC
  Administered 2020-11-18 – 2020-11-22 (×4): 40 mg via ORAL
  Filled 2020-11-18 (×4): qty 1

## 2020-11-18 MED ORDER — LEVOTHYROXINE SODIUM 25 MCG PO TABS: 25.0000 ug | ORAL_TABLET | Freq: Every day | ORAL | Status: DC

## 2020-11-18 MED ORDER — AMOXICILLIN 500 MG PO CAPS: 500.0000 mg | ORAL_CAPSULE | Freq: Three times a day (TID) | ORAL | Status: DC

## 2020-11-18 MED ORDER — QUETIAPINE FUMARATE 25 MG PO TABS
25.0000 mg | ORAL_TABLET | Freq: Three times a day (TID) | ORAL | Status: DC
  Administered 2020-11-18 – 2020-11-22 (×10): 25 mg via ORAL
  Filled 2020-11-18 (×11): qty 1

## 2020-11-18 MED ORDER — MELOXICAM 7.5 MG PO TABS: 7.5000 mg | ORAL_TABLET | Freq: Every day | ORAL | Status: DC

## 2020-11-18 MED ORDER — LORAZEPAM 2 MG/ML IJ SOLN
0.5000 mg | Freq: Two times a day (BID) | INTRAMUSCULAR | Status: DC | PRN
  Administered 2020-11-18 – 2020-11-21 (×3): 0.5 mg via INTRAVENOUS
  Filled 2020-11-18 (×3): qty 1

## 2020-11-18 MED ORDER — IPRATROPIUM-ALBUTEROL 20-100 MCG/ACT IN AERS
1.0000 | INHALATION_SPRAY | Freq: Four times a day (QID) | RESPIRATORY_TRACT | Status: DC
  Administered 2020-11-18 – 2020-11-22 (×10): 1 via RESPIRATORY_TRACT
  Filled 2020-11-18 (×2): qty 4

## 2020-11-18 MED ORDER — PRAVASTATIN SODIUM 40 MG PO TABS
40.0000 mg | ORAL_TABLET | Freq: Every day | ORAL | Status: DC
  Administered 2020-11-20 – 2020-11-22 (×3): 40 mg via ORAL
  Filled 2020-11-18 (×3): qty 1
  Filled 2020-11-18: qty 2

## 2020-11-18 MED ORDER — BUPROPION HCL ER (XL) 150 MG PO TB24
150.0000 mg | ORAL_TABLET | Freq: Every day | ORAL | Status: DC
  Administered 2020-11-18 – 2020-11-22 (×5): 150 mg via ORAL
  Filled 2020-11-18 (×5): qty 1

## 2020-11-18 MED ORDER — SENNOSIDES-DOCUSATE SODIUM 8.6-50 MG PO TABS: 1.0000 | ORAL_TABLET | Freq: Every evening | ORAL | Status: DC | PRN

## 2020-11-18 NOTE — Evaluation (Signed)
SLP Cancellation Note  Patient Details Name: [name] MRN: [id number] DOB: Jun 27, 1933   Cancelled treatment:       Reason Eval/Treat Not Completed: Other (comment) (per review of chart, pt is agitated, pulling on telemetry lines, etc, will continue efforts) RN advised she is reaching out to MD re: pt's agitation.    Dennis Lime, MS [facility] SLP Acute Rehab Services Office [phone] Pager [phone]    [name] 11/18/2020, 1:13 PM

## 2020-11-18 NOTE — ED Notes (Addendum)
Patient is still disoriented with unintelligible speech. Patient is oriented to himself, birthday, and wife. He is able to follow commands and express some thoughts. Patient can verbalize pain. Wife is at bedside.

## 2020-11-18 NOTE — ED Notes (Signed)
Walked into room to re-adjust patient. Advised wife and patient of the need for a straight cath for a urine sample. Patient was agitated and moving around the bed, complaining of back pain. Patient is disoriented but can follow commands and respond verbally. Linens were changed and patient was placed on external urinary device. Total of 313ml drained from bladder. Urine sample sent to lab.

## 2020-11-18 NOTE — H&P (Signed)
History and Physical    [name] CZY:[id number] DOB: 1934-01-18 DOA: 11/07/2020  PCP: [name], MD  Patient coming from: Home  Chief Complaint: AMS  HPI: [name] is a 87 y.o. male with medical history significant of CVA, HLD, dementia, orthostatic hypotension. Presenting with altered mental status. Patient is a poor historian. History is from wife. She reports that he started having episodes of shaking "like he was having a seizure" about 3 days ago. It was sudden onset. The episodes would last about an 1-hr at a time, but then increased to "all day long." There was no foaming, tongue biting/swallowing, and no incontinence. He did have slurred speech and confusion after. Of note, he started taking tramadol last Thursday. She believes this may be the root on his problems. She brought him to the ED for help. She denies any other aggravating or alleviating factors.    ED Course: CTH and UA were negative for acute change. CXR showed pattern consistent w/ PNA vs edema. He was found to be hypertensive. TRH was called for admission.   Review of Systems:  Unable to obtain d/t mentation.   PMHx Past Medical History:  Diagnosis Date   AAA (abdominal aortic aneurysm) ([place])    Aneurysm of iliac artery ([facility])    CAD (coronary artery disease)    a. s/p CABGx4 in 07/2015.   Carotid artery disease ([place])    Colon polyps    Diabetes ([place])    Difficult intubation    Duodenal ulcer disease 08/15/2018   Esophageal reflux    High cholesterol    History of IBS 02/27/2009   Hypertension    pt denies, he says he has a h/o hypotension. If BP up he adjusts the Florinef   Jejunitis with partial SBO 05/20/2017   Orthostatic hypotension    Prostatitis    Stroke [facility]    TIA (transient ischemic attack)    Upper GI bleed 06/24/2018   Vitamin B 12 deficiency    Vitamin D deficiency     PSHx Past Surgical History:  Procedure Laterality Date   BIOPSY  06/25/2018   Procedure: BIOPSY;   Surgeon: Irving Copas., MD;  Location: [place];  Service: Gastroenterology;;   BIOPSY  04/24/2019   Procedure: BIOPSY;  Surgeon: Irving Copas., MD;  Location: [facility];  Service: Gastroenterology;;   [place]   traumatic pelvic fractures, urethral and bladder repair   CARDIAC CATHETERIZATION N/A 07/01/2015   Procedure: Left Heart Cath and Coronary Angiography;  Surgeon: Wellington Hampshire, MD;  Location: [place] [facility];  Service: Cardiovascular;  Laterality: N/A;   COLON RESECTION N/A 05/17/2017   Procedure: DIAGNOSTIC LAPAROSCOPY,;  Surgeon: Leighton Ruff, MD;  Location: [facility];  Service: General;  Laterality: N/A;   COLONOSCOPY WITH PROPOFOL N/A 04/24/2019   Procedure: COLONOSCOPY WITH PROPOFOL;  Surgeon: Irving Copas., MD;  [facility];  Service: Gastroenterology;  Laterality: N/A;   CORONARY ANGIOPLASTY WITH STENT PLACEMENT     CORONARY ARTERY BYPASS GRAFT N/A 07/06/2015   Procedure: CORONARY ARTERY BYPASS GRAFTING (CABG)x 4   utilizing the left internal mammary artery and endoscopically harvested bilateral  sapheneous vein.;  Surgeon: Ivin Poot, MD;  Location: [place];  Service: Open Heart Surgery;  Laterality: N/A;   ESOPHAGEAL DILATION  04/24/2019   Procedure: ESOPHAGEAL DILATION;  Surgeon: Rush Landmark Telford Nab., MD;  [facility];  Service: Gastroenterology;;   ESOPHAGOGASTRODUODENOSCOPY (EGD) WITH PROPOFOL N/A 06/25/2018  Procedure: ESOPHAGOGASTRODUODENOSCOPY (EGD) WITH PROPOFOL;  Surgeon: Rush Landmark Telford Nab., MD;  Location: [place];  Service: Gastroenterology;  Laterality: N/A;   ESOPHAGOGASTRODUODENOSCOPY (EGD) WITH PROPOFOL N/A 04/24/2019   Procedure: ESOPHAGOGASTRODUODENOSCOPY (EGD) WITH PROPOFOL;  Surgeon: Rush Landmark Telford Nab., MD;  [facility];  Service: Gastroenterology;  Laterality: N/A;   FEMUR IM NAIL Right 11/16/2019   Procedure: RIGHT HIP INTRAMEDULLARY (IM) NAIL FEMORAL;   Surgeon: Meredith Pel, MD;  Location: [place];  Service: Orthopedics;  Laterality: Right;   HEMOSTASIS CLIP PLACEMENT  04/24/2019   Procedure: HEMOSTASIS CLIP PLACEMENT;  Surgeon: Irving Copas., MD;  Location: [facility];  Service: Gastroenterology;;   KNEE SURGERY     LOOP RECORDER INSERTION N/A 02/19/2018   Procedure: LOOP RECORDER INSERTION;  Surgeon: Deboraha Sprang, MD;  Location: [place] [facility];  Service: Cardiovascular;  Laterality: N/A;   POLYPECTOMY  04/24/2019   Procedure: POLYPECTOMY;  Surgeon: Rush Landmark Telford Nab., MD;  Location: [facility];  Service: Gastroenterology;;   SUBMUCOSAL LIFTING INJECTION  04/24/2019   Procedure: SUBMUCOSAL LIFTING INJECTION;  Surgeon: Irving Copas., MD;  [facility];  Service: Gastroenterology;;   TEE WITHOUT CARDIOVERSION N/A 07/06/2015   Procedure: TRANSESOPHAGEAL ECHOCARDIOGRAM (TEE);  Surgeon: Ivin Poot, MD;  Location: [place];  Service: Open Heart Surgery;  Laterality: N/A;    SocHx  reports that he has quit smoking. He quit smokeless tobacco use about 33 years ago.  His smokeless tobacco use included chew. He reports that he does not drink alcohol and does not use drugs.  Allergies  Allergen Reactions   Other Other (See Comments)    Pt reports allergic to a medication but unsure of name or type of med   Cymbalta [Duloxetine Hcl] Other (See Comments)    Dizziness, hallucinations.    Keflex [Cephalexin] Other (See Comments)    dizziness   Simvastatin Other (See Comments)    Joint pain   Sudafed [Pseudoephedrine] Other (See Comments)    Dizziness    FamHx Family History  Problem Relation Age of Onset   Heart attack Father        died age 31   Heart attack Brother        died age 53   Anuerysm Brother        aortic   Heart attack Sister        died age 72   Colon cancer Sister    Liver cancer Sister    Diabetes Maternal Grandmother    Arthritis Mother    Dementia Mother    Heart  Problems Brother    Heart Problems Brother    Heart Problems Brother    Heart Problems Brother    Healthy Daughter    Healthy Son    Esophageal cancer Neg Hx    Inflammatory bowel disease Neg Hx    Pancreatic cancer Neg Hx    Stomach cancer Neg Hx     Prior to Admission medications   Medication Sig Start Date End Date Taking? Authorizing Provider  acetaminophen (TYLENOL) 500 MG tablet Take 1,000 mg by mouth in the morning and at bedtime.   Yes [provider]  amoxicillin (AMOXIL) 500 MG capsule Take  1 capsule  3 x day  after Meals for Prostate Infection 11/14/20  Yes [name], MD  aspirin EC 81 MG tablet Take 1 tablet (81 mg total) by mouth daily. 10/12/20  Yes [place] Eric J, DO  buPROPion (WELLBUTRIN XL) 150 MG 24 hr  tablet Take 1 tablet (150 mg total) by mouth daily. 10/12/20  Yes [place] Donnamarie Poag, DO  butalbital-acetaminophen-caffeine (FIORICET) [phone] MG tablet Take 1 tablet by mouth every 4 (four) hours as needed for headache. 10/12/20  Yes [place] Donnamarie Poag, DO  Calcium Carbonate-Vit D-Min (CALCIUM 1200 PO) Take 1 tablet by mouth daily.   Yes [provider]  Cholecalciferol (VITAMIN D-3) 5000 units TABS Take 5,000 Units by mouth daily.   Yes [provider]  Cyanocobalamin (VITAMIN B-12) 3000 MCG SUBL Place 1 tablet under the tongue daily.   Yes [provider]  ferrous sulfate 325 (65 FE) MG tablet Take 325 mg by mouth daily with breakfast.   Yes [provider]  fludrocortisone (FLORINEF) 0.1 MG tablet Take  1 tablet 2 x /day - Morning & Night  - for Low BP 11/05/20  Yes [name], MD  gabapentin (NEURONTIN) 100 MG capsule Take 100 mg by mouth 3 (three) times daily.   Yes [provider]  glucose blood test strip 1 each by Other route as needed. Check blood sugar 1 time daily-DX-E11.9 10/12/20  Yes [place] Donnamarie Poag, DO  levothyroxine (SYNTHROID) 25 MCG tablet Take 1 tablet (25 mcg total) by mouth daily before  breakfast. 10/12/20  Yes [place] Donnamarie Poag, DO  meloxicam (MOBIC) 15 MG tablet Take 0.5-1 tablets (7.5-15 mg total) by mouth daily. 10/12/20  Yes [place] Eric J, DO  memantine (NAMENDA) 10 MG tablet Take 1 tablet (10 mg total) by mouth 2 (two) times daily. 10/12/20  Yes [place] Donnamarie Poag, DO  midodrine (PROAMATINE) 5 MG tablet Take  1 tablet  3 x /day  with Meals for Low BP 11/05/20  Yes [name], MD  Multiple Vitamin (MULTIVITAMIN WITH MINERALS) TABS tablet Take 1 tablet by mouth daily.   Yes [provider]  olmesartan (BENICAR) 40 MG tablet Take 20-40 mg by mouth daily as needed (high blood pressure).   Yes [provider]  pantoprazole (PROTONIX) 40 MG tablet Take 1 tablet (40 mg total) by mouth daily. 10/12/20  Yes [place] Donnamarie Poag, DO  Potassium 99 MG TABS Take 1 tablet by mouth daily.   Yes [provider]  pravastatin (PRAVACHOL) 40 MG tablet Take 1 tablet (40 mg total) by mouth daily. 10/12/20  Yes [place] Eric J, DO  QUEtiapine (SEROQUEL) 25 MG tablet Take 1 tablet 3 x /day for Agitation / Hallucinations 11/16/2020  Yes [name], MD  Sennosides (SENOKOT PO) Take by mouth.   Yes [provider]  Zinc 50 MG TABS Take 1 tablet by mouth daily.   Yes [provider]  traMADol-acetaminophen (ULTRACET) 37.5-325 MG tablet Take 1 tablet by mouth every 6 (six) hours as needed. 11/12/20   [name], MD    Physical Exam: Vitals:   11/18/20 0145 11/18/20 0400 11/18/20 0445 11/18/20 0645  BP: (!) 144/86 (!) 168/120 (!) 152/124 (!) 183/111  Pulse: 74 85 88 85  Resp: 16 17 17 17   Temp:    97.8 F (36.6 C)  TempSrc:    Oral  SpO2: 97% 99% 99% 93%    General: 86 y.o. male resting in bed in NAD Eyes: PERRL, normal sclera ENMT: Nares patent w/o discharge, orophaynx clear, dentition normal, ears w/o discharge/lesions/ulcers Neck: Supple, trachea midline Cardiovascular: RRR, +S1, S2, no m/g/r, equal pulses throughout Respiratory: CTABL, no  w/r/r, normal WOB GI: BS+, NDNT, no masses noted, no organomegaly noted MSK: No e/c/c Skin: No rashes, bruises, ulcerations noted  Neuro: A&O x 2, ? Right side facial droop; otherwise, no deficits; follows commands Psyc: pleasantly confused, calm/cooperative  Labs on Admission: I have personally reviewed following labs and imaging studies  CBC: Recent Labs  Lab 11/12/20 1115 11/07/2020 2124  WBC 9.1 7.6  NEUTROABS 6,598  --   HGB 10.4* 10.5*  HCT 31.9* 34.1*  MCV 98.2 106.2*  PLT 183 195*   Basic Metabolic Panel: Recent Labs  Lab 11/08/2020 2124  NA 142  K 4.3  CL 106  CO2 26  GLUCOSE 129*  BUN 33*  CREATININE 2.45*  CALCIUM 8.6*   GFR: Estimated Creatinine Clearance: 24.2 mL/min (A) (by C-G formula based on SCr of 2.45 mg/dL (H)). Liver Function Tests: Recent Labs  Lab 11/09/2020 2124  AST 20  ALT 9  ALKPHOS 112  BILITOT 0.7  PROT 6.3*  ALBUMIN 3.3*   No results for input(s): LIPASE, AMYLASE in the last 168 hours. No results for input(s): AMMONIA in the last 168 hours. Coagulation Profile: Recent Labs  Lab 11/22/2020 2124  INR 1.1   Cardiac Enzymes: No results for input(s): CKTOTAL, CKMB, CKMBINDEX, TROPONINI in the last 168 hours. BNP (last 3 results) No results for input(s): PROBNP in the last 8760 hours. HbA1C: No results for input(s): HGBA1C in the last 72 hours. CBG: Recent Labs  Lab 11/18/20 0516  GLUCAP 123*   Lipid Profile: No results for input(s): CHOL, HDL, LDLCALC, TRIG, CHOLHDL, LDLDIRECT in the last 72 hours. Thyroid Function Tests: No results for input(s): TSH, T4TOTAL, FREET4, T3FREE, THYROIDAB in the last 72 hours. Anemia Panel: No results for input(s): VITAMINB12, FOLATE, FERRITIN, TIBC, IRON, RETICCTPCT in the last 72 hours. Urine analysis:    Component Value Date/Time   COLORURINE YELLOW 11/18/2020 0405   APPEARANCEUR CLEAR 11/18/2020 0405   LABSPEC 1.021 11/18/2020 0405   PHURINE 5.0 11/18/2020 0405   GLUCOSEU NEGATIVE  11/18/2020 0405   HGBUR NEGATIVE 11/18/2020 0405   BILIRUBINUR NEGATIVE 11/18/2020 0405   KETONESUR NEGATIVE 11/18/2020 0405   PROTEINUR 30 (A) 11/18/2020 0405   UROBILINOGEN 1.0 01/05/2015 1550   NITRITE NEGATIVE 11/18/2020 0405   LEUKOCYTESUR NEGATIVE 11/18/2020 0405    Radiological Exams on Admission: CT Head Wo Contrast  Result Date: 11/15/2020 CLINICAL DATA:  Mental status change EXAM: CT HEAD WITHOUT CONTRAST TECHNIQUE: Contiguous axial images were obtained from the base of the skull through the vertex without intravenous contrast. COMPARISON:  CT brain 10/08/2020 FINDINGS: Brain: No acute territorial infarction, hemorrhage, or intracranial mass. There is motion degradation. Atrophy and extensive white matter disease. Chronic infarcts within the bilateral white matter, left basal ganglia and bilateral thalamus. Small chronic infarct in the right cerebellum. Stable ventricle size. Vascular: No hyperdense vessel.  Carotid vascular calcification Skull: Normal. Negative for fracture or focal lesion. Sinuses/Orbits: Mucosal thickening or cyst in the left frontal sinus. Other: None IMPRESSION: 1. Slight motion degradation limits the exam 2. Atrophy and extensive white matter disease. Multiple small chronic infarcts. No definite CT evidence for acute intracranial abnormality Electronically Signed   By: Donavan Foil M.D.   On: 11/02/2020 22:33   DG Chest Port 1 View  Result Date: 10/30/2020 CLINICAL DATA:  Altered mental status EXAM: PORTABLE CHEST 1 VIEW COMPARISON:  10/05/2020 FINDINGS: Post sternotomy changes. Electronic recording device over the left chest. Cardiomegaly. Asymmetric interstitial and ground-glass opacity throughout the right thorax, favor diffuse pneumonia over asymmetric edema. No pleural effusion or pneumothorax. IMPRESSION: 1. Asymmetric diffuse interstitial and ground-glass opacity in the right thorax favored  to represent diffuse pneumonia over asymmetric edema 2. Cardiomegaly  Electronically Signed   By: Donavan Foil M.D.   On: 11/27/2020 22:34    EKG: Independently reviewed. Sinus, no st elevations  Assessment/Plan Acute metabolic encephalopathy     - admit to inpt, tele     - c/o of slurred speech, confusion, "shaking episodes"; exam is only focal for right facial droop     - CTH is negative, check MRI Brain, EEG; if positive, consult neuro     - WBC normal, UA unimpressive, no fevers     - question if tramadol is the root, but also given he's had 5 CVAs in the past; good possibility that he may have had a new CVA; thus MRI above     - avoid tramadol for now     - ? Role COVID is having; Dx'd at home with home test beginning of July/end of June; given 10 days of decadron; CXR findings above; no role for remdes now; would hate to have steroids exacerbate the AMS, but he has a new O2 requirement, so we will proceed with more decadron     - bedside swallow study prior to diet  HTN Hx of orthostatic Hypotension     - BP is up, will allow for it until we get MRI back     - normally takes midodrine 5mg  TID for low BP, hold for now     - normally take florinef 0.1 BID, can continue     - normally takes benicar 20 - 40mg  PRN for HTN, hold for now  Dementia     - resume home wellbutrin, namenda, seroquel  HLD     - resume home statin  Hx of CVA     - see above  AKI on CKD3b     - give fluids, check renal US, follow  Macrocytic anemia     - check B12, folate  Thrombocytopenia     - mild, follow  Prostatitis     - started on amoxicillin 500mg  TID on 7/19; can continue  Hypothyroidism     - continue home synthroid  DVT prophylaxis: SCDs  Code Status: DNR  Family Communication: w/ wife at bedside  Consults called: None   Status is: Inpatient  Remains inpatient appropriate because:Inpatient level of care appropriate due to severity of illness  Dispo: The patient is from: Home              Anticipated d/c is to: Home              Patient  currently is not medically stable to d/c.   Difficult to place patient No  Time spent coordinating admission: 70 minutes  [facility]  If 7PM-7AM, please contact night-coverage [email or web address]  11/18/2020, 7:21 AM

## 2020-11-18 NOTE — Progress Notes (Signed)
Patient able to take pills with some apple sauce and water.

## 2020-11-18 NOTE — Plan of Care (Signed)
PT was brought to MRI for a brain scan without contrast. Pt was unable to do any of his scan due to his confusion. Pt was trying to crawl off of stretcher once he was laid flat and swinging at staff. He can not lay flat or lay still for his test unfortunately.  With his current mentation and agitation he is not an MRI candidate at this time. RN and MD will be notified via direct chat.

## 2020-11-18 NOTE — ED Provider Notes (Addendum)
Care assumed from Dr. Joya Gaskins, patient with altered mental status, elevated blood pressure. Blood work is at baseline. Urinalysis is pending. Will need to be admitted.  Urinalysis shows no evidence of infection.  Case is discussed with Dr. Cyd Silence of [facility] hospitalist, who agrees to admit the patient.   Delora Fuel, MD 86/76/72 0947    Delora Fuel, MD 09/62/83 [phone]

## 2020-11-18 NOTE — ED Notes (Signed)
Per MRI tech pt was unable to lay still for the MRI. MD made aware.

## 2020-11-18 NOTE — Progress Notes (Signed)
PHARMACY NOTE:  ANTIMICROBIAL RENAL DOSAGE ADJUSTMENT  Current antimicrobial regimen includes a mismatch between antimicrobial dosage and estimated renal function.  As per policy approved by the Pharmacy & Therapeutics and Medical Executive Committees, the antimicrobial dosage will be adjusted accordingly.  Current antimicrobial dosage:  amoxicillin 500mg  TID  Indication: prostatitis (started outpatient on 7/19)  Renal Function:  Estimated Creatinine Clearance: 24.2 mL/min (A) (by C-G formula based on SCr of 2.45 mg/dL (H)). []      On intermittent HD, scheduled: []      On CRRT    Antimicrobial dosage has been changed to:  amoxicillin 500mg  BID  Additional comments:   Thank you for allowing pharmacy to be a part of this patient's care.  Dimple Nanas, [facility] 11/18/2020 2:35 PM

## 2020-11-18 NOTE — ED Notes (Signed)
Pt is very agitated, pulling cardiac monitor cords off and seems very restless. MD notified.

## 2020-11-18 NOTE — Progress Notes (Signed)
Called nurse at [facility] [employer]. Stated she gave him Ativan before trying the MRI with no success nurse stated patient may have actually got worse. Nurse stated patient has not sat still and is consistently moving and pulling at wires.

## 2020-11-19 ENCOUNTER — Inpatient Hospital Stay (HOSPITAL_COMMUNITY): Payer: Medicare Other

## 2020-11-19 ENCOUNTER — Ambulatory Visit: Payer: Medicare Other | Admitting: Student

## 2020-11-19 DIAGNOSIS — G9341 Metabolic encephalopathy: Secondary | ICD-10-CM | POA: Diagnosis not present

## 2020-11-19 LAB — URINE CULTURE: Culture: NO GROWTH

## 2020-11-19 LAB — COMPREHENSIVE METABOLIC PANEL
ALT: 12 U/L (ref 0–44)
AST: 21 U/L (ref 15–41)
Albumin: 3.3 g/dL — ABNORMAL LOW (ref 3.5–5.0)
Alkaline Phosphatase: 98 U/L (ref 38–126)
Anion gap: 11 (ref 5–15)
BUN: 36 mg/dL — ABNORMAL HIGH (ref 8–23)
CO2: 26 mmol/L (ref 22–32)
Calcium: 8.8 mg/dL — ABNORMAL LOW (ref 8.9–10.3)
Chloride: 106 mmol/L (ref 98–111)
Creatinine, Ser: 2.07 mg/dL — ABNORMAL HIGH (ref 0.61–1.24)
GFR, Estimated: 31 mL/min — ABNORMAL LOW (ref >60)
Glucose, Bld: 140 mg/dL — ABNORMAL HIGH (ref 70–99)
Potassium: 4.2 mmol/L (ref 3.5–5.1)
Sodium: 143 mmol/L (ref 135–145)
Total Bilirubin: 1.1 mg/dL (ref 0.3–1.2)
Total Protein: 6.3 g/dL — ABNORMAL LOW (ref 6.5–8.1)

## 2020-11-19 LAB — CBC WITH DIFFERENTIAL/PLATELET
Abs Immature Granulocytes: 0.06 10*3/uL (ref 0.00–0.07)
Basophils Absolute: 0 10*3/uL (ref 0.0–0.1)
Basophils Relative: 0 %
Eosinophils Absolute: 0 10*3/uL (ref 0.0–0.5)
Eosinophils Relative: 0 %
HCT: 31.6 % — ABNORMAL LOW (ref 39.0–52.0)
Hemoglobin: 9.7 g/dL — ABNORMAL LOW (ref 13.0–17.0)
Immature Granulocytes: 1 %
Lymphocytes Relative: 14 %
Lymphs Abs: 1.3 10*3/uL (ref 0.7–4.0)
MCH: 32 pg (ref 26.0–34.0)
MCHC: 30.7 g/dL (ref 30.0–36.0)
MCV: 104.3 fL — ABNORMAL HIGH (ref 80.0–100.0)
Monocytes Absolute: 0.5 10*3/uL (ref 0.1–1.0)
Monocytes Relative: 6 %
Neutro Abs: 7.2 10*3/uL (ref 1.7–7.7)
Neutrophils Relative %: 79 %
Platelets: 167 10*3/uL (ref 150–400)
RBC: 3.03 MIL/uL — ABNORMAL LOW (ref 4.22–5.81)
RDW: 17.1 % — ABNORMAL HIGH (ref 11.5–15.5)
WBC: 9.1 10*3/uL (ref 4.0–10.5)
nRBC: 0 % (ref 0.0–0.2)

## 2020-11-19 LAB — LIPID PANEL
Cholesterol: 136 mg/dL (ref 0–200)
HDL: 44 mg/dL (ref >40)
LDL Cholesterol: 82 mg/dL (ref 0–99)
Total CHOL/HDL Ratio: 3.1 RATIO
Triglycerides: 50 mg/dL (ref <150)
VLDL: 10 mg/dL (ref 0–40)

## 2020-11-19 LAB — HEMOGLOBIN A1C
Hgb A1c MFr Bld: 6 % — ABNORMAL HIGH (ref 4.8–5.6)
Mean Plasma Glucose: 125.5 mg/dL

## 2020-11-19 LAB — GLUCOSE, CAPILLARY
Glucose-Capillary: 131 mg/dL — ABNORMAL HIGH (ref 70–99)
Glucose-Capillary: 137 mg/dL — ABNORMAL HIGH (ref 70–99)
Glucose-Capillary: 141 mg/dL — ABNORMAL HIGH (ref 70–99)
Glucose-Capillary: 142 mg/dL — ABNORMAL HIGH (ref 70–99)
Glucose-Capillary: 149 mg/dL — ABNORMAL HIGH (ref 70–99)

## 2020-11-19 LAB — D-DIMER, QUANTITATIVE: D-Dimer, Quant: 2.15 ug/mL-FEU — ABNORMAL HIGH (ref 0.00–0.50)

## 2020-11-19 LAB — AMMONIA: Ammonia: 13 umol/L (ref 9–35)

## 2020-11-19 LAB — C-REACTIVE PROTEIN: CRP: 18.4 mg/dL — ABNORMAL HIGH (ref <1.0)

## 2020-11-19 LAB — FERRITIN: Ferritin: 241 ng/mL (ref 24–336)

## 2020-11-19 MED ORDER — ACETAMINOPHEN 500 MG PO TABS
1000.0000 mg | ORAL_TABLET | Freq: Four times a day (QID) | ORAL | Status: DC
  Filled 2020-11-19: qty 2

## 2020-11-19 MED ORDER — DEXTROSE-NACL 5-0.9 % IV SOLN: INTRAVENOUS | Status: DC

## 2020-11-19 MED ORDER — SODIUM CHLORIDE 0.9 % IV SOLN
3.0000 g | Freq: Two times a day (BID) | INTRAVENOUS | Status: DC
  Administered 2020-11-19 – 2020-11-20 (×4): 3 g via INTRAVENOUS
  Filled 2020-11-19 (×3): qty 8
  Filled 2020-11-19 (×2): qty 3

## 2020-11-19 MED ORDER — MORPHINE SULFATE (PF) 2 MG/ML IV SOLN
2.0000 mg | INTRAVENOUS | Status: DC | PRN
  Administered 2020-11-19 – 2020-11-22 (×6): 2 mg via INTRAVENOUS
  Filled 2020-11-19 (×7): qty 1

## 2020-11-19 MED ORDER — ACETAMINOPHEN 650 MG RE SUPP
650.0000 mg | RECTAL | Status: DC
  Administered 2020-11-19 – 2020-11-21 (×8): 650 mg via RECTAL
  Filled 2020-11-19 (×7): qty 1

## 2020-11-19 NOTE — Evaluation (Addendum)
Clinical/Bedside Swallow Evaluation Patient Details  Name: [name] MRN: [id number] Date of Birth: 1934/03/05  Today's Date: 11/19/2020 Time: SLP Start Time (ACUTE ONLY): 1748 SLP Stop Time (ACUTE ONLY): 1810 SLP Time Calculation (min) (ACUTE ONLY): 22 min  Past Medical History:  Past Medical History:  Diagnosis Date   AAA (abdominal aortic aneurysm) ([place])    Aneurysm of iliac artery (HCC)    CAD (coronary artery disease)    a. s/p CABGx4 in 07/2015.   Carotid artery disease ([place])    Colon polyps    Diabetes ([place])    Difficult intubation    Duodenal ulcer disease 08/15/2018   Esophageal reflux    High cholesterol    History of IBS 02/27/2009   Hypertension    pt denies, he says he has a h/o hypotension. If BP up he adjusts the [place]   Jejunitis with partial SBO 05/20/2017   Orthostatic hypotension    Prostatitis    Stroke [facility])    TIA (transient ischemic attack)    Upper GI bleed 06/24/2018   Vitamin B 12 deficiency    Vitamin D deficiency    Past Surgical History:  Past Surgical History:  Procedure Laterality Date   BIOPSY  06/25/2018   Procedure: BIOPSY;  Surgeon: Irving Copas., MD;  Location: [place];  Service: Gastroenterology;;   BIOPSY  04/24/2019   Procedure: BIOPSY;  Surgeon: Irving Copas., MD;  [facility];  Service: Gastroenterology;;   [place]   traumatic pelvic fractures, urethral and bladder repair   CARDIAC CATHETERIZATION N/A 07/01/2015   Procedure: Left Heart Cath and Coronary Angiography;  Surgeon: Wellington Hampshire, MD;  Location: [place] [facility];  Service: Cardiovascular;  Laterality: N/A;   COLON RESECTION N/A 05/17/2017   Procedure: DIAGNOSTIC LAPAROSCOPY,;  Surgeon: Leighton Ruff, MD;  [facility];  Service: General;  Laterality: N/A;   COLONOSCOPY WITH PROPOFOL N/A 04/24/2019   Procedure: COLONOSCOPY WITH PROPOFOL;  Surgeon: Irving Copas., MD;  [facility];   Service: Gastroenterology;  Laterality: N/A;   CORONARY ANGIOPLASTY WITH STENT PLACEMENT     CORONARY ARTERY BYPASS GRAFT N/A 07/06/2015   Procedure: CORONARY ARTERY BYPASS GRAFTING (CABG)x 4   utilizing the left internal mammary artery and endoscopically harvested bilateral  sapheneous vein.;  Surgeon: Ivin Poot, MD;  Location: [place];  Service: Open Heart Surgery;  Laterality: N/A;   ESOPHAGEAL DILATION  04/24/2019   Procedure: ESOPHAGEAL DILATION;  Surgeon: Rush Landmark Telford Nab., MD;  Location: [facility];  Service: Gastroenterology;;   ESOPHAGOGASTRODUODENOSCOPY (EGD) WITH PROPOFOL N/A 06/25/2018   Procedure: ESOPHAGOGASTRODUODENOSCOPY (EGD) WITH PROPOFOL;  Surgeon: Irving Copas., MD;  Location: [place];  Service: Gastroenterology;  Laterality: N/A;   ESOPHAGOGASTRODUODENOSCOPY (EGD) WITH PROPOFOL N/A 04/24/2019   Procedure: ESOPHAGOGASTRODUODENOSCOPY (EGD) WITH PROPOFOL;  Surgeon: Rush Landmark Telford Nab., MD;  [facility];  Service: Gastroenterology;  Laterality: N/A;   FEMUR IM NAIL Right 11/16/2019   Procedure: RIGHT HIP INTRAMEDULLARY (IM) NAIL FEMORAL;  Surgeon: Meredith Pel, MD;  Location: [place];  Service: Orthopedics;  Laterality: Right;   HEMOSTASIS CLIP PLACEMENT  04/24/2019   Procedure: HEMOSTASIS CLIP PLACEMENT;  Surgeon: Irving Copas., MD;  Location: [facility];  Service: Gastroenterology;;   KNEE SURGERY     LOOP RECORDER INSERTION N/A 02/19/2018   Procedure: LOOP RECORDER INSERTION;  Surgeon: Deboraha Sprang, MD;  Location: [place] [facility];  Service: Cardiovascular;  Laterality: N/A;   POLYPECTOMY  04/24/2019   Procedure: POLYPECTOMY;  Surgeon: Rush Landmark Telford Nab., MD;  Location: [facility];  Service: Gastroenterology;;   Maryagnes Amos INJECTION  04/24/2019   Procedure: SUBMUCOSAL LIFTING INJECTION;  Surgeon: Irving Copas., MD;  Location: [facility];  Service: Gastroenterology;;   TEE WITHOUT  CARDIOVERSION N/A 07/06/2015   Procedure: TRANSESOPHAGEAL ECHOCARDIOGRAM (TEE);  Surgeon: Ivin Poot, MD;  Location: [place];  Service: Open Heart Surgery;  Laterality: N/A;   HPI:  87 year old man with medical history including history of a stroke, vascular dementia admitted to [facility] after having shaking/tremors and AMS x3 days and now dyspneic.  Pt diagnosed with COVID one month ago.  hyperlipidemia, orthostatic hypotension, history of CABG brought to the emergency room with altered mental status.  Reportedly, he has been suffering from shaking and tremors for about 3 days.  Slurred speech and confusion also reported.  Per MD note, pt has severe back pain problems and was recently given tramadol. He was also diagnosed with COVID-19 at home test 1 month ago, wife reports negative testing 2 weeks after.  Swallow and speech eval ordered.  Chest imaging concering for progressive opacities.   Assessment / Plan / Recommendation Clinical Impression  Evaluation conducted as RN contacted SLP advising MD desires pt to undergo swallow evaluation today to determine if po diet appropriate.   Pt currently demonstrates poor mentation which deems him inappropriate for po diet. He awakens briefly with max verbal/tactile stimulation but does not maintain alertness.  SLP provided oral care with use of suction.  Administered pt single, small ice chip with pt losing bolus from anterior oral cavity. Delayed swallow noted with 1/4 tsp of thin water - followed by delayed weak cough- concerning for airway infiltration.    Recommend pt continue NPO and SLP follow up to assess readiness for po diet.    Informed RN and MD of recommendations. SLP Visit Diagnosis: Dysphagia, oropharyngeal phase (R13.12)    Aspiration Risk  Severe aspiration risk;Risk for inadequate nutrition/hydration    Diet Recommendation NPO;Other (Comment) (oral care QID)   Medication Administration: Via alternative means    Other  Recommendations  Oral Care Recommendations: Oral care QID   Follow up Recommendations        Frequency and Duration min 1 x/week  1 week       Prognosis Prognosis for Safe Diet Advancement: Guarded Barriers to Reach Goals: Cognitive deficits;Other (Comment) (mentation)      Swallow Study   General Date of Onset: 11/19/20 HPI: 87 year old man with medical history including history of a stroke, vascular dementia admitted to [facility] after having shaking/tremors and AMS x3 days and now dyspneic.  Pt diagnosed with COVID one month ago.  hyperlipidemia, orthostatic hypotension, history of CABG brought to the emergency room with altered mental status.  Reportedly, he has been suffering from shaking and tremors for about 3 days.  Slurred speech and confusion also reported.  Per MD note, pt has severe back pain problems and was recently given tramadol. He was also diagnosed with COVID-19 at home test 1 month ago, wife reports negative testing 2 weeks after.  Swallow and speech eval ordered.  Chest imaging concering for progressive opacities. Type of Study: Bedside Swallow Evaluation Diet Prior to this Study: NPO History of Recent Intubation: No Behavior/Cognition: Distractible;Doesn't follow directions;Lethargic/Drowsy Oral Cavity Assessment: Dry Oral Care Completed by SLP: Yes Oral Cavity - Dentition: Dentures, top;Dentures, bottom (not placed) Vision: Impaired for self-feeding Self-Feeding Abilities: Total assist Patient Positioning: Upright in bed Baseline  Vocal Quality: Low vocal intensity Volitional Cough: Cognitively unable to elicit Volitional Swallow: Unable to elicit    Oral/Motor/Sensory Function Overall Oral Motor/Sensory Function: Generalized oral weakness   Ice Chips Ice chips: Impaired Presentation: Spoon Oral Phase Impairments: Reduced labial seal Oral Phase Functional Implications: Right anterior spillage;Left anterior spillage Pharyngeal Phase Impairments: Suspected delayed Swallow   Thin  Liquid Thin Liquid: Impaired Presentation: Spoon Oral Phase Impairments: Reduced labial seal;Poor awareness of bolus Oral Phase Functional Implications: Right anterior spillage;Left anterior spillage Pharyngeal  Phase Impairments: Suspected delayed Swallow;Cough - Delayed    Nectar Thick Nectar Thick Liquid: Not tested   Honey Thick Honey Thick Liquid: Not tested   Puree Puree: Not tested   Solid    Kathleen Lime, MS [facility] SLP Acute Rehab Services Office [phone] Pager [phone]  Solid: Not tested      [name] 11/19/2020,7:50 PM

## 2020-11-19 NOTE — Progress Notes (Signed)
Pharmacy Antibiotic Note  [name] is a 87 y.o. male admitted on 11/12/2020 with  aspiration PNA  .  Pharmacy has been consulted for Unasyn dosing.  Plan: Unasyn 3 gr IV q12h  Monitor clinical course, renal function, cultures as available   Height: 6' (182.9 cm) Weight: 89.8 kg (198 lb) IBW/kg (Calculated) : 77.6  Temp (24hrs), Avg:97.8 F (36.6 C), Min:97.3 F (36.3 C), Max:98.6 F (37 C)  Recent Labs  Lab 11/27/2020 2124 11/19/20 0359  WBC 7.6 9.1  CREATININE 2.45* 2.07*    Estimated Creatinine Clearance: 28.6 mL/min (A) (by C-G formula based on SCr of 2.07 mg/dL (H)).    Allergies  Allergen Reactions   Other Other (See Comments)    Pt reports allergic to a medication but unsure of name or type of med   Cymbalta [Duloxetine Hcl] Other (See Comments)    Dizziness, hallucinations.    Keflex [Cephalexin] Other (See Comments)    dizziness   Simvastatin Other (See Comments)    Joint pain   Sudafed [Pseudoephedrine] Other (See Comments)    Dizziness     Royetta Asal, PharmD, BCPS 11/19/2020 1:29 PM

## 2020-11-19 NOTE — Progress Notes (Signed)
PT Cancellation Note  Patient Details Name: [name] MRN: [id number] DOB: 10-06-1933   Cancelled Treatment:    Reason Eval/Treat Not Completed: Fatigue/lethargy limiting ability to participate  will check back tomorrow.   Claretha Cooper 11/19/2020, 12:15 PM  [place] Pager [phone] Office [phone]

## 2020-11-19 NOTE — Progress Notes (Signed)
EEG order being discontinued at this time after speaking w Dr.Ghimire. He will reorder at later time if needed.

## 2020-11-19 NOTE — Progress Notes (Signed)
OT Cancellation Note  Patient Details Name: JSEAN TAUSSIG MRN: [id number] DOB: 04/19/1934   Cancelled Treatment:    Reason Eval/Treat Not Completed: Medical issues which prohibited therapy patient is not cleared for therapy per nurse. Will try again tomorrow   Jackelyn Poling OTR/L, [place] Acute Rehabilitation Department Office# [phone] Pager# [phone]   [place] 11/19/2020, 11:52 AM

## 2020-11-19 NOTE — Progress Notes (Signed)
PROGRESS NOTE    [name]  GLO:[id number] DOB: Apr 29, 1934 DOA: 11/11/2020 PCP: [name], MD    Brief Narrative:  87 year old gentleman with extensive medical history including history of a stroke, vascular dementia, hyperlipidemia, orthostatic hypotension, history of CABG brought to the emergency room with altered mental status.  Reportedly, he has been suffering from shaking and tremors for about 3 days.  No demonstrable seizure-like episodes.  No incontinence.  Also noted to have slurred speech and confusion. Patient has severe back pain problems and was recently given tramadol, last dose taken was 7/18.  Wife reports possible correlation with the use of tramadol. He was also diagnosed with COVID-19 at home test 1 month ago, wife reports negative testing 2 weeks after that. Admitted with altered mental status and shortness of breath. Patient was treated for interval UTI with amoxicillin and dose was extended for presumed prostatitis.   Assessment & Plan:   Active Problems:   Acute metabolic encephalopathy  Acute metabolic encephalopathy: In a patient with underlying vascular dementia and advanced debility. Probably multifactorial Rule out infection, blood cultures and urine cultures to be collected, chest x-ray with possible pneumonia, will treat with Unasyn to also cover for aspiration pneumonia. Rule out possible recurrent stroke, patient is agitated and anxious today, head CT on admission was without any acute findings.  We will do MRI once his behavior is well controlled. Ammonia is normal.  Vitamin levels are normal.  Blood gas and lysis with no evidence of CO2 retention. Continue supportive care. Tramadol can also cause altered mentation, will discontinue altogether.  Careful use of short acting opiates for pain relief. Urine cultures and blood cultures to repeat today to rule out systemic infection with recent history of UTI and prostatitis.  Starting on  Unasyn. EEG ordered, patient is difficult to stay calm and quiet, will schedule for tomorrow.  Acute hypoxemic respiratory failure: With recent history of COVID-19 infection.  More than 4 weeks since initial infections.  Chest x-ray consistent with asymmetrical pneumonia.  CRP 18.4. No role of antiviral therapy with history of more than 4 weeks now.  Will use low-dose steroids as anti-inflammatory/post COVID syndrome.  We will start chest physiotherapy as most possible. With asymmetrical opacity, will treat as aspiration pneumonia with Unasyn after collecting cultures. Speech and swallow evaluation.  Dementia: On Wellbutrin Namenda and Seroquel that will be resumed.  CKD stage IIIb: At about baseline.  Continue to monitor.  Enterococcal UTI/prostatitis: On amoxicillin.  Changed to Unasyn until clinical improvement.  Repeat cultures as above.  Hypothyroidism: Synthroid as above.  Acute on chronic back pain: Patient has severe back pain problems.  Struggling with pain management.  This may be contributing to altered mentation.  I discussed with patient's wife at the bedside, will use local lidocaine patch, scheduled high-dose Tylenol and small dose of morphine to help pain relief.  There is no acute neurological changes of lower extremities.     DVT prophylaxis: SCD's Start: 11/18/20 1000   Code Status: DNR Family Communication: Wife at the bedside Disposition Plan: Status is: Inpatient  Remains inpatient appropriate because:Altered mental status and Inpatient level of care appropriate due to severity of illness  Dispo: The patient is from: Home              Anticipated d/c is to: Home              Patient currently is not medically stable to d/c.   Difficult to place patient No  Consultants:  None  Procedures:  None  Antimicrobials:  Amoxicillin 7/20-7/21 Unasyn 7/21---   Subjective: Patient seen and examined.  I attended patient early morning with wife at  the bedside as there was concern about increasing oxygen requirement as well as more agitation.  He was turned back from MRI because of restlessness. Patient himself tells me he is fine but unable to give any other history.  He denies being short of breath or having chest pain.  He is currently on 6 L of oxygen through nasal cannula.  He was looking very restless and trying to position himself in the bed, after giving 2 mg of morphine dose he looked slightly comfortable.  Objective: Vitals:   11/19/20 0259 11/19/20 0400 11/19/20 1006 11/19/20 1131  BP:  (!) 188/129 (!) 181/122 (!) 142/129  Pulse:  (!) 108  (!) 105  Resp: 17 18  20   Temp:    98.6 F (37 C)  TempSrc:    Axillary  SpO2: 96% 97% 94% 96%  Weight:      Height:        Intake/Output Summary (Last 24 hours) at 11/19/2020 1333 Last data filed at 11/19/2020 0900 Gross per 24 hour  Intake 50 ml  Output 100 ml  Net -50 ml   Filed Weights   11/18/20 1751  Weight: 89.8 kg    Examination:  General exam: Patient looks frail and debilitated.  Anxious. He does have some resting tremor and occasional jerking movements. Patient is alert and awake to interview but not oriented. Respiratory system: Mostly upper airway sounds.  No other added sounds. Cardiovascular system: S1 & S2 heard, RRR.  No edema.   Gastrointestinal system: Soft and nontender. Extremities: No focal deficit.  Generalized weakness.    Data Reviewed: I have personally reviewed following labs and imaging studies  CBC: Recent Labs  Lab 11/11/2020 2124 11/19/20 0359  WBC 7.6 9.1  NEUTROABS  --  7.2  HGB 10.5* 9.7*  HCT 34.1* 31.6*  MCV 106.2* 104.3*  PLT 144* 161   Basic Metabolic Panel: Recent Labs  Lab 11/12/2020 2124 11/19/20 0359  NA 142 143  K 4.3 4.2  CL 106 106  CO2 26 26  GLUCOSE 129* 140*  BUN 33* 36*  CREATININE 2.45* 2.07*  CALCIUM 8.6* 8.8*   GFR: Estimated Creatinine Clearance: 28.6 mL/min (A) (by C-G formula based on SCr of 2.07  mg/dL (H)). Liver Function Tests: Recent Labs  Lab 11/11/2020 2124 11/19/20 0359  AST 20 21  ALT 9 12  ALKPHOS 112 98  BILITOT 0.7 1.1  PROT 6.3* 6.3*  ALBUMIN 3.3* 3.3*   No results for input(s): LIPASE, AMYLASE in the last 168 hours. Recent Labs  Lab 11/19/20 0940  AMMONIA 13   Coagulation Profile: Recent Labs  Lab 11/29/2020 2124  INR 1.1   Cardiac Enzymes: No results for input(s): CKTOTAL, CKMB, CKMBINDEX, TROPONINI in the last 168 hours. BNP (last 3 results) No results for input(s): PROBNP in the last 8760 hours. HbA1C: Recent Labs    11/19/20 0619  HGBA1C 6.0*   CBG: Recent Labs  Lab 11/18/20 0516 11/19/20 0021 11/19/20 0429 11/19/20 0732 11/19/20 1127  GLUCAP 123* 141* 142* 137* 131*   Lipid Profile: Recent Labs    11/19/20 0619  CHOL 136  HDL 44  LDLCALC 82  TRIG 50  CHOLHDL 3.1   Thyroid Function Tests: No results for input(s): TSH, T4TOTAL, FREET4, T3FREE, THYROIDAB in the last 72 hours. Anemia Panel: Recent  Labs    11/18/20 1047 11/18/20 1048 11/19/20 0359  VITAMINB12 2,471*  --   --   FOLATE  --  11.6  --   FERRITIN  --   --  241   Sepsis Labs: No results for input(s): PROCALCITON, LATICACIDVEN in the last 168 hours.  Recent Results (from the past 240 hour(s))  Urine Culture     Status: Abnormal   Collection Time: 11/11/20 10:59 AM   Specimen: Urine  Result Value Ref Range Status   MICRO NUMBER: [id number]  Final   SPECIMEN QUALITY: Adequate  Final   Sample Source URINE  Final   STATUS: FINAL  Final   ISOLATE 1: Enterococcus faecalis (A)  Final    Comment: 10,000-49,000 CFU/mL of Enterococcus faecalis      Susceptibility   Enterococcus faecalis - URINE CULTURE POSITIVE 1    AMPICILLIN <=2 Sensitive     VANCOMYCIN 1 Sensitive     NITROFURANTOIN* <=16 Sensitive      * Legend: S = Susceptible  I = Intermediate R = Resistant  NS = Not susceptible * = Not tested  NR = Not reported **NN = See antimicrobic comments   Resp  Panel by RT-PCR (Flu A&B, Covid) Nasopharyngeal Swab     Status: Abnormal   Collection Time: 11/14/2020 10:05 PM   Specimen: Nasopharyngeal Swab; Nasopharyngeal(NP) swabs in vial transport medium  Result Value Ref Range Status   SARS Coronavirus 2 by RT PCR POSITIVE (A) NEGATIVE Final    Comment: RESULT CALLED TO, READ BACK BY AND VERIFIED WITH: BONIS, RN @ 0263 ON 11/18/20 C VARNER (NOTE) SARS-CoV-2 target nucleic acids are DETECTED.  The SARS-CoV-2 RNA is generally detectable in upper respiratory specimens during the acute phase of infection. Positive results are indicative of the presence of the identified virus, but do not rule out bacterial infection or co-infection with other pathogens not detected by the test. Clinical correlation with patient history and other diagnostic information is necessary to determine patient infection status. The expected result is Negative.  Fact Sheet for Patients: EntrepreneurPulse.com.au  Fact Sheet for Healthcare Providers: IncredibleEmployment.be  This test is not yet approved or cleared by the [place] FDA and  has been authorized for detection and/or diagnosis of SARS-CoV-2 by FDA under an Emergency Use Authorization (EUA).  This EUA will remain in effect (meaning this test can  be used) for the duration of  the COVID-19 declaration under Section 564(b)(1) of the Act, 21 U.S.C. section 360bbb-3(b)(1), unless the authorization is terminated or revoked sooner.     Influenza A by PCR NEGATIVE NEGATIVE Final   Influenza B by PCR NEGATIVE NEGATIVE Final    Comment: (NOTE) The Xpert Xpress SARS-CoV-2/FLU/RSV plus assay is intended as an aid in the diagnosis of influenza from Nasopharyngeal swab specimens and should not be used as a sole basis for treatment. Nasal washings and aspirates are unacceptable for Xpert Xpress SARS-CoV-2/FLU/RSV testing.  Fact Sheet for  Patients: EntrepreneurPulse.com.au  Fact Sheet for Healthcare Providers: IncredibleEmployment.be  This test is not yet approved or cleared by the [place] FDA and has been authorized for detection and/or diagnosis of SARS-CoV-2 by FDA under an Emergency Use Authorization (EUA). This EUA will remain in effect (meaning this test can be used) for the duration of the COVID-19 declaration under Section 564(b)(1) of the Act, 21 U.S.C. section 360bbb-3(b)(1), unless the authorization is terminated or revoked.  Performed at [facility], [place] [address], [place], [place] [address]  Urine Culture     Status: None   Collection Time: 11/18/20  4:05 AM   Specimen: Urine, Clean Catch  Result Value Ref Range Status   Specimen Description   Final    URINE, CLEAN CATCH Performed at [facility], [place] [address] [place], [place] [address]    Special Requests   Final    NONE Performed at [facility], [place] [address] [place], [place] [address]    Culture   Final    NO GROWTH Performed at [facility], [place] [address] [place], [place] [address]    Report Status 11/19/2020 FINAL  Final         Radiology Studies: CT Head Wo Contrast  Result Date: 11/19/2020 CLINICAL DATA:  Mental status change EXAM: CT HEAD WITHOUT CONTRAST TECHNIQUE: Contiguous axial images were obtained from the base of the skull through the vertex without intravenous contrast. COMPARISON:  CT brain 10/08/2020 FINDINGS: Brain: No acute territorial infarction, hemorrhage, or intracranial mass. There is motion degradation. Atrophy and extensive white matter disease. Chronic infarcts within the bilateral white matter, left basal ganglia and bilateral thalamus. Small chronic infarct in the right cerebellum. Stable ventricle size. Vascular: No hyperdense vessel.  Carotid vascular calcification Skull: Normal. Negative for fracture  or focal lesion. Sinuses/Orbits: Mucosal thickening or cyst in the left frontal sinus. Other: None IMPRESSION: 1. Slight motion degradation limits the exam 2. Atrophy and extensive white matter disease. Multiple small chronic infarcts. No definite CT evidence for acute intracranial abnormality Electronically Signed   By: Donavan Foil M.D.   On: 11/12/2020 22:33   DG CHEST PORT 1 VIEW  Result Date: 11/19/2020 CLINICAL DATA:  COVID positive. EXAM: PORTABLE CHEST 1 VIEW COMPARISON:  11/21/2020. FINDINGS: Prior CABG. Stable cardiomegaly. No pulmonary venous congestion. Progressive right lung interstitial infiltrate. Left lung base interstitial infiltrate also noted. No prominent pleural effusion. No pneumothorax. IMPRESSION: 1. Prior CABG. Stable cardiomegaly. No pulmonary venous congestion. 2. Progressive right lung interstitial infiltrate. Left lung base interstitial infiltrate also noted. Electronically Signed   By: Marcello Moores  Register   On: 11/19/2020 08:10   DG Chest Port 1 View  Result Date: 11/27/2020 CLINICAL DATA:  Altered mental status EXAM: PORTABLE CHEST 1 VIEW COMPARISON:  10/05/2020 FINDINGS: Post sternotomy changes. Electronic recording device over the left chest. Cardiomegaly. Asymmetric interstitial and ground-glass opacity throughout the right thorax, favor diffuse pneumonia over asymmetric edema. No pleural effusion or pneumothorax. IMPRESSION: 1. Asymmetric diffuse interstitial and ground-glass opacity in the right thorax favored to represent diffuse pneumonia over asymmetric edema 2. Cardiomegaly Electronically Signed   By: Donavan Foil M.D.   On: 11/28/2020 22:34        Scheduled Meds:  acetaminophen  1,000 mg Oral Q6H   aspirin EC  81 mg Oral Daily   buPROPion  150 mg Oral Daily   dexamethasone (DECADRON) injection  6 mg Intravenous Q24H   fludrocortisone  0.1 mg Oral BID   Ipratropium-Albuterol  1 puff Inhalation Q6H   levothyroxine  25 mcg Oral Q0600   memantine  10 mg Oral  BID   multivitamin with minerals  1 tablet Oral Daily   pantoprazole  40 mg Oral Daily   pravastatin  40 mg Oral Daily   QUEtiapine  25 mg Oral TID   Continuous Infusions:  ampicillin-sulbactam (UNASYN) IV       LOS: 1 day    Time spent: 40 minutes    Barb Merino, MD [facility]  Pager [phone]

## 2020-11-20 ENCOUNTER — Inpatient Hospital Stay (HOSPITAL_COMMUNITY)
Admit: 2020-11-20 | Discharge: 2020-11-20 | Disposition: A | Discharge status: Home / Self Care | Payer: Medicare Other | Attending: Internal Medicine | Admitting: Internal Medicine

## 2020-11-20 DIAGNOSIS — G9341 Metabolic encephalopathy: Secondary | ICD-10-CM

## 2020-11-20 LAB — COMPREHENSIVE METABOLIC PANEL
ALT: 64 U/L — ABNORMAL HIGH (ref 0–44)
AST: 83 U/L — ABNORMAL HIGH (ref 15–41)
Albumin: 3.2 g/dL — ABNORMAL LOW (ref 3.5–5.0)
Alkaline Phosphatase: 101 U/L (ref 38–126)
Anion gap: 9 (ref 5–15)
BUN: 45 mg/dL — ABNORMAL HIGH (ref 8–23)
CO2: 25 mmol/L (ref 22–32)
Calcium: 8.6 mg/dL — ABNORMAL LOW (ref 8.9–10.3)
Chloride: 110 mmol/L (ref 98–111)
Creatinine, Ser: 2.04 mg/dL — ABNORMAL HIGH (ref 0.61–1.24)
GFR, Estimated: 31 mL/min — ABNORMAL LOW (ref >60)
Glucose, Bld: 169 mg/dL — ABNORMAL HIGH (ref 70–99)
Potassium: 4.2 mmol/L (ref 3.5–5.1)
Sodium: 144 mmol/L (ref 135–145)
Total Bilirubin: 1 mg/dL (ref 0.3–1.2)
Total Protein: 6.2 g/dL — ABNORMAL LOW (ref 6.5–8.1)

## 2020-11-20 LAB — CBC WITH DIFFERENTIAL/PLATELET
Abs Immature Granulocytes: 0.05 10*3/uL (ref 0.00–0.07)
Basophils Absolute: 0 10*3/uL (ref 0.0–0.1)
Basophils Relative: 0 %
Eosinophils Absolute: 0 10*3/uL (ref 0.0–0.5)
Eosinophils Relative: 0 %
HCT: 31.7 % — ABNORMAL LOW (ref 39.0–52.0)
Hemoglobin: 9.9 g/dL — ABNORMAL LOW (ref 13.0–17.0)
Immature Granulocytes: 1 %
Lymphocytes Relative: 13 %
Lymphs Abs: 1.3 10*3/uL (ref 0.7–4.0)
MCH: 32.4 pg (ref 26.0–34.0)
MCHC: 31.2 g/dL (ref 30.0–36.0)
MCV: 103.6 fL — ABNORMAL HIGH (ref 80.0–100.0)
Monocytes Absolute: 0.6 10*3/uL (ref 0.1–1.0)
Monocytes Relative: 7 %
Neutro Abs: 7.5 10*3/uL (ref 1.7–7.7)
Neutrophils Relative %: 79 %
Platelets: 195 10*3/uL (ref 150–400)
RBC: 3.06 MIL/uL — ABNORMAL LOW (ref 4.22–5.81)
RDW: 17 % — ABNORMAL HIGH (ref 11.5–15.5)
WBC: 9.5 10*3/uL (ref 4.0–10.5)
nRBC: 0.4 % — ABNORMAL HIGH (ref 0.0–0.2)

## 2020-11-20 LAB — GLUCOSE, CAPILLARY
Glucose-Capillary: 153 mg/dL — ABNORMAL HIGH (ref 70–99)
Glucose-Capillary: 151 mg/dL — ABNORMAL HIGH (ref 70–99)
Glucose-Capillary: 211 mg/dL — ABNORMAL HIGH (ref 70–99)
Glucose-Capillary: 183 mg/dL — ABNORMAL HIGH (ref 70–99)
Glucose-Capillary: 215 mg/dL — ABNORMAL HIGH (ref 70–99)

## 2020-11-20 LAB — FERRITIN: Ferritin: 288 ng/mL (ref 24–336)

## 2020-11-20 LAB — PROCALCITONIN: Procalcitonin: 0.1 ng/mL

## 2020-11-20 LAB — D-DIMER, QUANTITATIVE: D-Dimer, Quant: 3.84 ug/mL-FEU — ABNORMAL HIGH (ref 0.00–0.50)

## 2020-11-20 LAB — C-REACTIVE PROTEIN: CRP: 15.1 mg/dL — ABNORMAL HIGH (ref <1.0)

## 2020-11-20 MED ORDER — FOOD THICKENER (SIMPLYTHICK): 1.0000 | ORAL | Status: DC | PRN

## 2020-11-20 NOTE — Progress Notes (Signed)
PROGRESS NOTE    DEOVION PIOLI  [id number] DOB: 08/03/1933 DOA: 10/30/2020 PCP: [name], MD    Brief Narrative:  87 year old gentleman with extensive medical history including history of multiple stroke, vascular dementia, hyperlipidemia, orthostatic hypotension, history of CABG brought to the emergency room with altered mental status.  Reportedly, he has been suffering from shaking and tremors for about 3 days.  No demonstrable seizure-like episodes.  No incontinence.  Also noted to have slurred speech and confusion. Patient has severe back pain problems and was recently given tramadol, last dose taken was 7/18.  Wife reports possible correlation with the use of tramadol. He was also diagnosed with COVID-19 at home test 1 month ago, wife reports negative testing 2 weeks after that. Admitted with altered mental status and shortness of breath. Patient was treated for enterococcal UTI with amoxicillin and dose was extended for presumed prostatitis.   Assessment & Plan:   Active Problems:   Acute metabolic encephalopathy  Acute metabolic encephalopathy: In a patient with underlying vascular dementia and advanced debility. Probably multifactorial Recent COVID infection, UTI, suspected stroke. Rule out infection, blood cultures and urine cultures collected, chest x-ray with possible pneumonia, treating with Unasyn to also cover for aspiration pneumonia. Rule out possible recurrent stroke, patient is agitated and anxious, head CT on admission was without any acute findings.  We will do MRI today. Ammonia is normal.  Vitamin levels are normal.  Blood gas and analysis with no evidence of CO2 retention. Continue supportive care. Tramadol can also cause altered mentation, will discontinue altogether.  Careful use of short acting opiates for pain relief. Will order EEG for today. Will use a small dose of morphine and Ativan before going for MRI of the head.  Acute hypoxemic  respiratory failure: With recent history of COVID-19 infection.  More than 4 weeks since initial infections.  Chest x-ray consistent with asymmetrical pneumonia.  CRP 18.4. No role of antiviral therapy with history of more than 4 weeks now.  Will use low-dose steroids as anti-inflammatory/post COVID syndrome.  We will start chest physiotherapy as most possible. With asymmetrical opacity, will treat as aspiration pneumonia with Unasyn after collecting cultures. Speech and swallow evaluation.  Dementia: On Wellbutrin Namenda and Seroquel that will be resumed once he can safely take by mouth.  CKD stage IIIb: At about baseline.  Continue to monitor.  Enterococcal UTI/prostatitis: On amoxicillin.  Changed to Unasyn until clinical improvement.  Repeat cultures as above.  Hypothyroidism: Synthroid as above.  Acute on chronic back pain: Patient has severe back pain problems.  Struggling with pain management.  This may be contributing to altered mentation.  I discussed with patient's wife at the bedside, will use local lidocaine patch, scheduled high-dose Tylenol and small dose of morphine to help pain relief.    DVT prophylaxis: SCD's Start: 11/18/20 1000   Code Status: DNR Family Communication: Wife at the bedside Disposition Plan: Status is: Inpatient  Remains inpatient appropriate because:Altered mental status and Inpatient level of care appropriate due to severity of illness  Dispo: The patient is from: Home              Anticipated d/c is to: Skilled nursing facility.              Patient currently is not medically stable to d/c.   Difficult to place patient No  Consultants:  None  Procedures:  None  Antimicrobials:  Amoxicillin 7/20-7/21 Unasyn 7/21---   Subjective: Patient was seen and examined.  Overnight events noted.  Mostly remains lethargic. In the morning rounds, he will respond to name, he was able to briefly open eyes and follow commands.  Today on exam, he is using  his left side less than right side. Wife at the bedside. No events on telemetry.  Shaking and jerking movements less prominent today. 2 mg of morphine really helped him to stay quiet and comfortable.  Objective: Vitals:   11/19/20 2013 11/19/20 2250 11/20/20 0417 11/20/20 0428  BP: (!) 186/126 (!) 190/123  (!) 191/133  Pulse: (!) 110     Resp: 19     Temp: 99.3 F (37.4 C) 98.7 F (37.1 C) 98.3 F (36.8 C)   TempSrc: Axillary Axillary Axillary   SpO2: 96%     Weight:      Height:        Intake/Output Summary (Last 24 hours) at 11/20/2020 1044 Last data filed at 11/20/2020 0418 Gross per 24 hour  Intake --  Output 300 ml  Net -300 ml   Filed Weights   11/18/20 1751  Weight: 89.8 kg    Examination:  General exam: Patient looks frail and debilitated.   Follows simple commands but mostly sleepy. Prefers right side.  Alert and awake on stimulation but not oriented. Respiratory system: Mostly upper airway sounds.  No other added sounds. Cardiovascular system: S1 & S2 heard, RRR.  No edema.   Gastrointestinal system: Soft and nontender. Extremities:  Patient is still sleepy.  Wakes up on stimulation.  No obvious cranial nerve deficits. Follows commands with normal strength on the right upper and lower extremity. Follows commands with decreased strength on left upper and lower extremity. No jerking or shaking movements today.    Data Reviewed: I have personally reviewed following labs and imaging studies  CBC: Recent Labs  Lab 11/14/2020 2124 11/19/20 0359 11/20/20 0337  WBC 7.6 9.1 9.5  NEUTROABS  --  7.2 7.5  HGB 10.5* 9.7* 9.9*  HCT 34.1* 31.6* 31.7*  MCV 106.2* 104.3* 103.6*  PLT 144* 167 0000000   Basic Metabolic Panel: Recent Labs  Lab 11/19/2020 2124 11/19/20 0359 11/20/20 0337  NA 142 143 144  K 4.3 4.2 4.2  CL 106 106 110  CO2 '26 26 25  '$ GLUCOSE 129* 140* 169*  BUN 33* 36* 45*  CREATININE 2.45* 2.07* 2.04*  CALCIUM 8.6* 8.8* 8.6*   GFR: Estimated  Creatinine Clearance: 29.1 mL/min (A) (by C-G formula based on SCr of 2.04 mg/dL (H)). Liver Function Tests: Recent Labs  Lab 10/31/2020 2124 11/19/20 0359 11/20/20 0337  AST 20 21 83*  ALT 9 12 64*  ALKPHOS 112 98 101  BILITOT 0.7 1.1 1.0  PROT 6.3* 6.3* 6.2*  ALBUMIN 3.3* 3.3* 3.2*   No results for input(s): LIPASE, AMYLASE in the last 168 hours. Recent Labs  Lab 11/19/20 0940  AMMONIA 13   Coagulation Profile: Recent Labs  Lab 11/26/2020 2124  INR 1.1   Cardiac Enzymes: No results for input(s): CKTOTAL, CKMB, CKMBINDEX, TROPONINI in the last 168 hours. BNP (last 3 results) No results for input(s): PROBNP in the last 8760 hours. HbA1C: Recent Labs    11/19/20 0619  HGBA1C 6.0*   CBG: Recent Labs  Lab 11/19/20 0732 11/19/20 1127 11/19/20 1638 11/20/20 0426 11/20/20 0823  GLUCAP 137* 131* 149* 153* 151*   Lipid Profile: Recent Labs    11/19/20 0619  CHOL 136  HDL 44  LDLCALC 82  TRIG 50  CHOLHDL 3.1   Thyroid Function  Tests: No results for input(s): TSH, T4TOTAL, FREET4, T3FREE, THYROIDAB in the last 72 hours. Anemia Panel: Recent Labs    11/18/20 1047 11/18/20 1048 11/19/20 0359 11/20/20 0337  VITAMINB12 2,471*  --   --   --   FOLATE  --  11.6  --   --   FERRITIN  --   --  241 288   Sepsis Labs: No results for input(s): PROCALCITON, LATICACIDVEN in the last 168 hours.  Recent Results (from the past 240 hour(s))  Urine Culture     Status: Abnormal   Collection Time: 11/11/20 10:59 AM   Specimen: Urine  Result Value Ref Range Status   MICRO NUMBER: [id number]  Final   SPECIMEN QUALITY: Adequate  Final   Sample Source URINE  Final   STATUS: FINAL  Final   ISOLATE 1: Enterococcus faecalis (A)  Final    Comment: 10,000-49,000 CFU/mL of Enterococcus faecalis      Susceptibility   Enterococcus faecalis - URINE CULTURE POSITIVE 1    AMPICILLIN <=2 Sensitive     VANCOMYCIN 1 Sensitive     NITROFURANTOIN* <=16 Sensitive      * Legend: S =  Susceptible  I = Intermediate R = Resistant  NS = Not susceptible * = Not tested  NR = Not reported **NN = See antimicrobic comments   Resp Panel by RT-PCR (Flu A&B, Covid) Nasopharyngeal Swab     Status: Abnormal   Collection Time: 11/16/2020 10:05 PM   Specimen: Nasopharyngeal Swab; Nasopharyngeal(NP) swabs in vial transport medium  Result Value Ref Range Status   SARS Coronavirus 2 by RT PCR POSITIVE (A) NEGATIVE Final    Comment: RESULT CALLED TO, READ BACK BY AND VERIFIED WITH: BONIS, RN @ [id number] ON 11/18/20 C VARNER (NOTE) SARS-CoV-2 target nucleic acids are DETECTED.  The SARS-CoV-2 RNA is generally detectable in upper respiratory specimens during the acute phase of infection. Positive results are indicative of the presence of the identified virus, but do not rule out bacterial infection or co-infection with other pathogens not detected by the test. Clinical correlation with patient history and other diagnostic information is necessary to determine patient infection status. The expected result is Negative.  Fact Sheet for Patients: EntrepreneurPulse.com.au  Fact Sheet for Healthcare Providers: IncredibleEmployment.be  This test is not yet approved or cleared by the [place] FDA and  has been authorized for detection and/or diagnosis of SARS-CoV-2 by FDA under an Emergency Use Authorization (EUA).  This EUA will remain in effect (meaning this test can  be used) for the duration of  the COVID-19 declaration under Section 564(b)(1) of the Act, 21 U.S.C. section 360bbb-3(b)(1), unless the authorization is terminated or revoked sooner.     Influenza A by PCR NEGATIVE NEGATIVE Final   Influenza B by PCR NEGATIVE NEGATIVE Final    Comment: (NOTE) The Xpert Xpress SARS-CoV-2/FLU/RSV plus assay is intended as an aid in the diagnosis of influenza from Nasopharyngeal swab specimens and should not be used as a sole basis for treatment. Nasal  washings and aspirates are unacceptable for Xpert Xpress SARS-CoV-2/FLU/RSV testing.  Fact Sheet for Patients: EntrepreneurPulse.com.au  Fact Sheet for Healthcare Providers: IncredibleEmployment.be  This test is not yet approved or cleared by the [place] FDA and has been authorized for detection and/or diagnosis of SARS-CoV-2 by FDA under an Emergency Use Authorization (EUA). This EUA will remain in effect (meaning this test can be used) for the duration of the COVID-19 declaration under Section 564(b)(1) of the  Act, 21 U.S.C. section 360bbb-3(b)(1), unless the authorization is terminated or revoked.  Performed at [facility], [place] [address] [place], [place] [address]   Urine Culture     Status: None   Collection Time: 11/18/20  4:05 AM   Specimen: Urine, Clean Catch  Result Value Ref Range Status   Specimen Description   Final    URINE, CLEAN CATCH Performed at [facility], [place] [address] [place], [place] [address]    Special Requests   Final    NONE Performed at [facility], [place] [address], [place], [place] [address]    Culture   Final    NO GROWTH Performed at [facility], [place] [address] [place], [place] [address]    Report Status 11/19/2020 FINAL  Final  Culture, blood (routine x 2)     Status: None (Preliminary result)   Collection Time: 11/19/20  9:40 AM   Specimen: BLOOD  Result Value Ref Range Status   Specimen Description   Final    BLOOD LEFT ANTECUBITAL Performed at [facility] [address], [place], [place] [address]    Special Requests   Final    BOTTLES DRAWN AEROBIC AND ANAEROBIC Blood Culture adequate volume Performed at [facility] [address] [place], [place] [address]    Culture   Final    NO GROWTH < 24 HOURS Performed at [facility] [address], [place], [place] [address]     Report Status PENDING  Incomplete  Culture, blood (routine x 2)     Status: None (Preliminary result)   Collection Time: 11/19/20  9:40 AM   Specimen: BLOOD RIGHT HAND  Result Value Ref Range Status   Specimen Description   Final    BLOOD RIGHT HAND Performed at [facility] [address], [place], [place] [address]    Special Requests   Final    BOTTLES DRAWN AEROBIC ONLY Blood Culture adequate volume Performed at [facility] [address], [place],  [address]    Culture   Final    NO GROWTH < 24 HOURS Performed at [facility] [address], [place],  [address]    Report Status PENDING  Incomplete         Radiology Studies: DG CHEST PORT 1 VIEW  Result Date: 11/19/2020 CLINICAL DATA:  COVID positive. EXAM: PORTABLE CHEST 1 VIEW COMPARISON:  11/11/2020. FINDINGS: Prior CABG. Stable cardiomegaly. No pulmonary venous congestion. Progressive right lung interstitial infiltrate. Left lung base interstitial infiltrate also noted. No prominent pleural effusion. No pneumothorax. IMPRESSION: 1. Prior CABG. Stable cardiomegaly. No pulmonary venous congestion. 2. Progressive right lung interstitial infiltrate. Left lung base interstitial infiltrate also noted. Electronically Signed   By: Marcello Moores  Register   On: 11/19/2020 08:10        Scheduled Meds:  acetaminophen  650 mg Rectal Q4H   aspirin EC  81 mg Oral Daily   buPROPion  150 mg Oral Daily   dexamethasone (DECADRON) injection  6 mg Intravenous Q24H   fludrocortisone  0.1 mg Oral BID   Ipratropium-Albuterol  1 puff Inhalation Q6H   levothyroxine  25 mcg Oral Q0600   memantine  10 mg Oral BID   multivitamin with minerals  1 tablet Oral Daily   pantoprazole  40 mg Oral Daily   pravastatin  40 mg Oral Daily   QUEtiapine  25 mg Oral  TID   Continuous Infusions:  ampicillin-sulbactam (UNASYN) IV 3 g (11/20/20 0844)   dextrose 5 % and 0.9% NaCl 100 mL/hr at 11/20/20  0429     LOS: 2 days    Time spent: 35 minutes    Barb Merino, MD [facility] Pager [phone]

## 2020-11-20 NOTE — Progress Notes (Signed)
EEG complete - results pending 

## 2020-11-20 NOTE — Evaluation (Signed)
Occupational Therapy Evaluation Patient Details Name: [name] MRN: [id number] DOB: March 12, 1934 Today's Date: 11/20/2020    History of Present Illness [name] is a 87 y.o. male presenting with altered mental status. Pt with recent hx of COVID+ diagnosis one month ago. Chest CT showed PNA vs pulmonary edema. PMH: CVA, HLD, dementia, orthostatic hypotension.   Clinical Impression   PTA, pt lives with spouse and typically ambulatory with RW in the home, wheelchair in the community. Pt able to toilet self and complete UB ADLs with Modified Independence. Wife assists with LB ADLs at home. Pt presents now below functional baseline and limited by lethargy this AM. However, once sitting EOB, pt awakens more and able to demo sit to stand at bedside with Min A x 2 using RW. Pt fatigued quickly and unable to take steps. Pt requires Max A for UB ADLs and Total A x 2 (if in standing) for LB ADLs. Wife present and engaged throughout session. Recommend SNF rehab prior to return home.   Received on 6 L O2, SpO2 >93% sustained with titration to 5 L O2.     Follow Up Recommendations  SNF;Supervision/Assistance - 24 hour    Equipment Recommendations  None recommended by OT    Recommendations for Other Services       Precautions / Restrictions Precautions Precautions: Fall Restrictions Weight Bearing Restrictions: No      Mobility Bed Mobility Overal bed mobility: Needs Assistance Bed Mobility: Supine to Sit;Sit to Supine     Supine to sit: Max assist;+2 for physical assistance;+2 for safety/equipment;HOB elevated Sit to supine: Total assist;+2 for physical assistance;+2 for safety/equipment   General bed mobility comments: Max A x 2 overall to get EOB (impacted by lethargy) with pt able to reach to bedrail and therapist's hand for support. Total A x 2 to get back to bed.    Transfers Overall transfer level: Needs assistance Equipment used: Rolling walker (2  wheeled) Transfers: Sit to/from Stand Sit to Stand: Min assist;+2 safety/equipment;+2 physical assistance;From elevated surface         General transfer comment: Min A x 2 for sit to stand with RW with pt able to use momentum and count to 3 prior to standing. Unable to take side steps due to fatigue after < 10 seconds of standing    Balance Overall balance assessment: Needs assistance Sitting-balance support: Bilateral upper extremity supported;Feet supported Sitting balance-Leahy Scale: Poor Sitting balance - Comments: reliant on BUE support and up to Mod A to correct posterior LOB Postural control: Posterior lean Standing balance support: Bilateral upper extremity supported;During functional activity Standing balance-Leahy Scale: Poor Standing balance comment: reliant on UE support + external assist                           ADL either performed or assessed with clinical judgement   ADL Overall ADL's : Needs assistance/impaired Eating/Feeding: NPO Eating/Feeding Details (indicate cue type and reason): likely Max A Grooming: Maximal assistance;Bed level;Wash/dry face   Upper Body Bathing: Maximal assistance;Sitting   Lower Body Bathing: Total assistance;+2 for physical assistance;+2 for safety/equipment;Sit to/from stand   Upper Body Dressing : Maximal assistance;Sitting   Lower Body Dressing: Total assistance;+2 for safety/equipment;+2 for physical assistance;Sit to/from stand       Toileting- Water quality scientist and Hygiene: Total assistance;Bed level         General ADL Comments: Pt limited by lethargy (falling asleep throughout) that improved when sitting  EOB and with activity. Requiring increased physical assist for all transfers and ADLs, as well as at an increased risk for falls     Vision Baseline Vision/History: Wears glasses Wears Glasses: At all times Patient Visual Report: No change from baseline Vision Assessment?: No apparent visual  deficits     Perception     Praxis      Pertinent Vitals/Pain Pain Assessment: Faces Faces Pain Scale: No hurt Pain Intervention(s): Monitored during session     Hand Dominance Right   Extremity/Trunk Assessment Upper Extremity Assessment Upper Extremity Assessment: Generalized weakness   Lower Extremity Assessment Lower Extremity Assessment: Defer to PT evaluation   Cervical / Trunk Assessment Cervical / Trunk Assessment: Kyphotic   Communication Communication Communication: HOH   Cognition Arousal/Alertness: Lethargic Behavior During Therapy: Impulsive;Flat affect Overall Cognitive Status: Difficult to assess                                 General Comments: Hx of dementia at baseline, difficult to fully assess due to lethargy. Able to follow directions with multimodal cues. impulsive behaviors reaching out to grab items/lines though able to redirect   General Comments  SpO2 > 93% on 6 L O2, able to titrate down to 5 L O2 with same response. HR 110. Wife present and supportive, physically engaged in assisting pt    Exercises     Shoulder Instructions      Home Living Family/patient expects to be discharged to:: Private residence Living Arrangements: Spouse/significant other Available Help at Discharge: Family;Available 24 hours/day Type of Home: House Home Access: Ramped entrance     Home Layout: One level     Bathroom Shower/Tub: Teacher, early years/pre: Standard     Home Equipment: Environmental consultant - 2 wheels;Cane - single point;Electric scooter;Transport chair;Bedside commode;Wheelchair - manual;Tub bench;Walker - 4 wheels          Prior Functioning/Environment Level of Independence: Needs assistance  Gait / Transfers Assistance Needed: [name] reports pt at times needs assistance to stand. Pt has lift chair at home. Pt uses both RW and w/c in the home for mobility, w/c use when fatigued. Pt uses electric scooter in community ADL's  / Homemaking Assistance Needed: Wife assists with LB ADLs (dressing, sponge bathing). Typically able to complete UB ADLs, toileting tasks.            OT Problem List: Decreased strength;Decreased activity tolerance;Impaired balance (sitting and/or standing);Decreased cognition;Decreased safety awareness;Decreased knowledge of use of DME or AE;Cardiopulmonary status limiting activity      OT Treatment/Interventions: Self-care/ADL training;Therapeutic exercise;DME and/or AE instruction;Therapeutic activities;Patient/family education;Balance training    OT Goals(Current goals can be found in the care plan section) Acute Rehab OT Goals Patient Stated Goal: wife would like pt to be able to participate with therapy, be able to get up more and go home OT Goal Formulation: With patient/family Time For Goal Achievement: 12/04/20 Potential to Achieve Goals: Fair  OT Frequency: Min 2X/week   Barriers to D/C:            Co-evaluation              AM-PAC OT "6 Clicks" Daily Activity     Outcome Measure Help from another person eating meals?: A Lot Help from another person taking care of personal grooming?: A Lot Help from another person toileting, which includes using toliet, bedpan, or urinal?: Total Help from another person  bathing (including washing, rinsing, drying)?: Total Help from another person to put on and taking off regular upper body clothing?: A Lot Help from another person to put on and taking off regular lower body clothing?: Total 6 Click Score: 9   End of Session Equipment Utilized During Treatment: Rolling walker;Oxygen Nurse Communication: Mobility status;Other (comment) (O2)  Activity Tolerance: Patient limited by lethargy Patient left: in bed;with call bell/phone within reach;with bed alarm set;with family/visitor present  OT Visit Diagnosis: Unsteadiness on feet (R26.81);Other abnormalities of gait and mobility (R26.89);Muscle weakness (generalized)  (M62.81);Other symptoms and signs involving cognitive function                Time: [id number] OT Time Calculation (min): 43 min Charges:  OT General Charges $OT Visit: 1 Visit OT Evaluation $OT Eval Moderate Complexity: 1 Mod OT Treatments $Self Care/Home Management : 8-22 mins $Therapeutic Activity: 8-22 mins  Malachy Chamber, OTR/L Acute Rehab Services Office: [phone]   Layla Maw 11/20/2020, 10:03 AM

## 2020-11-20 NOTE — Evaluation (Signed)
Physical Therapy Evaluation Patient Details Name: [name] MRN: [id number] DOB: [birth year] Today's Date: 11/20/2020   History of Present Illness  [name] is a 87 y.o. male presenting with altered mental status. Pt with recent hx of COVID+ diagnosis one month ago. Chest CT showed PNA vs pulmonary edema. PMH: CVA, HLD, dementia, orthostatic hypotension.  Clinical Impression  Pt is an 87 y.o. male with above HPI resulting in the deficits listed below (see PT Problem List). Pt requires MOD-MAX A +2 for all mobility at this time. [name] present and very engaged during session. She reports that pt has not been ambulatory for ~2wks and has been working with [facility] on transfers and strengthening recently. Recommend SNF as pt currently requires increased assist to maintain safety with all mobility. Pt is currently below baseline and will benefit from skilled PT to increase their independence and safety with mobility to allow discharge to the venue listed below.      Follow Up Recommendations SNF    Equipment Recommendations  None recommended by PT (pt owns RW)    Recommendations for Other Services       Precautions / Restrictions Precautions Precautions: Fall Restrictions Weight Bearing Restrictions: No      Mobility  Bed Mobility Overal bed mobility: Needs Assistance Bed Mobility: Supine to Sit;Sit to Supine     Supine to sit: Max assist;+2 for physical assistance;+2 for safety/equipment;HOB elevated Sit to supine: Total assist;+2 for physical assistance;+2 for safety/equipment   General bed mobility comments: Max A +2 for supine to sit with use of chuck pad at pelvis to scoot to EOB. Pt able to reach for therapist hand for support. Attempted verbal cuing for sit to supine, pt with some initiation of LEs onto bed but unable to achieve requiring total A+2 to return to supine. Pt required MOD-MAX A+2 to maintain seated balance due to significant retropulsion. Pt with  eyes closed requiring tactile and auditory cuing to arouse. When unstimulaed pt immediately closes eyes again.    Transfers Overall transfer level: Needs assistance Equipment used: Rolling walker (2 wheeled) Transfers: Sit to/from Stand Sit to Stand: Min assist;+2 safety/equipment;+2 physical assistance;From elevated surface         General transfer comment: x2; Min A +2 for sit to stand with RW and single step cuing for pt to "stand up." Pt required manual facilitation of safe hand placement for transfer. Attempted side stepping but pt unable to follow command and with increased retroupulsion in standing and . Ptw as unable to step safely with foot sliding forward when attempting to step requiring manual block to keep pt's LE from sliding underneath him. Pt maintained eyes open during sit to stand transfer but otherwise lethargic with eyes closed during session.  Ambulation/Gait                Stairs            Wheelchair Mobility    Modified Rankin (Stroke Patients Only)       Balance Overall balance assessment: Needs assistance Sitting-balance support: Bilateral upper extremity supported;Feet supported Sitting balance-Leahy Scale: Poor Sitting balance - Comments: reliant on BUE support and up to Mod A to correct posterior LOB Postural control: Posterior lean Standing balance support: Bilateral upper extremity supported;During functional activity Standing balance-Leahy Scale: Poor Standing balance comment: reliant on UE support and external assist to maintain standing balance  Pertinent Vitals/Pain Pain Assessment: Faces Faces Pain Scale: Hurts little more Pain Location: R LB pain, pt intermittently jerking and moving LEs to try and get comfortable due to back pain Pain Intervention(s): Monitored during session;Repositioned    Home Living Family/patient expects to be discharged to:: Private residence Living Arrangements:  Spouse/significant other Available Help at Discharge: Family;Available 24 hours/day Type of Home: House Home Access: Ramped entrance     Home Layout: One level Home Equipment: Walker - 2 wheels;Cane - single point;Electric scooter;Transport chair;Bedside commode;Wheelchair - manual;Tub bench;Walker - 4 wheels Additional Comments: [name] reports that he has been doing HHPT but has not been ambulating for ~2wks. She has also noticed that he has been having trouble with moving R LE/UE sometimes, but has good strength so is unsure what could be going on.    Prior Function Level of Independence: Needs assistance   Gait / Transfers Assistance Needed: Per OT eval, "pt's wife reports pt at times needs assistance to stand. Pt has lift chair at home. Pt uses both RW and w/c in the home for mobility, w/c use when fatigued. Pt uses electric scooter in community"           Hand Dominance   Dominant Hand: Right    Extremity/Trunk Assessment   Upper Extremity Assessment Upper Extremity Assessment: Generalized weakness    Lower Extremity Assessment Lower Extremity Assessment: Generalized weakness    Cervical / Trunk Assessment Cervical / Trunk Assessment: Kyphotic  Communication   Communication: HOH  Cognition Arousal/Alertness: Lethargic Behavior During Therapy: Impulsive;Flat affect Overall Cognitive Status: Difficult to assess                                 General Comments: Hx of dementia at baseline, difficult to fully assess due to lethargy. Pt was able to follow directions with multimodal cues. Pt constantly grabbing out impulsivelyreaching out to grab items/lines and therapist, able to redirect with verbal and tactile cuing.      General Comments General comments (skin integrity, edema, etc.): Pt on 5L O2 throughout session, lowest O2 sat during session 91% but remained high 90% majority of session. V-tach alert noted on monitor x2 during session while pt was  EOB, but alert disappeared after ~30-45s each time, Pt reporting he is feeling "ok" when asked by PT, RN aware.    Exercises     Assessment/Plan    PT Assessment    PT Problem List         PT Treatment Interventions      PT Goals (Current goals can be found in the Care Plan section)  Acute Rehab PT Goals Patient Stated Goal: wife would like pt to be able to participate with therapy, be able to get up more and go home PT Goal Formulation: With patient/family Time For Goal Achievement: 12/04/20 Potential to Achieve Goals: Fair    Frequency     Barriers to discharge        Co-evaluation               AM-PAC PT "6 Clicks" Mobility  Outcome Measure Help needed turning from your back to your side while in a flat bed without using bedrails?: A Lot Help needed moving from lying on your back to sitting on the side of a flat bed without using bedrails?: Total Help needed moving to and from a bed to a chair (including a wheelchair)?: Total Help needed  standing up from a chair using your arms (e.g., wheelchair or bedside chair)?: Total Help needed to walk in hospital room?: Total Help needed climbing 3-5 steps with a railing? : Total 6 Click Score: 7    End of Session              Time: [id number] PT Time Calculation (min) (ACUTE ONLY): 45 min   Charges:   PT Evaluation $PT Eval Moderate Complexity: 1 Mod PT Treatments $Therapeutic Activity: 23-37 mins        Festus Barren PT, DPT  Acute Rehabilitation Services  Office [phone]    11/20/2020, 2:43 PM

## 2020-11-20 NOTE — Progress Notes (Addendum)
Speech Language Pathology Treatment: Dysphagia  Patient Details Name: [name] MRN: [id number] DOB: Sep 28, 1933 Today's Date: 11/20/2020 Time: [id number] SLP Time Calculation (min) (ACUTE ONLY): 45 min  Assessment / Plan / Recommendation Clinical Impression  Pt seen to assess readiness for po diet advancement.  His wife is in the room with him and he is currently lethargic.  Pt required consistent stimulation to awaken adequately to even trial po.  Once alert, his swallow is significantly delayed but no immediate overt clinical indications of aspiration.  Pt has h/o dysphagia with silent aspiration on MBS 2021 - wife reports he did not like thickened liquids when placed on them but she will willing for pt to use them if needed as a bridge to recovery.  Wife admits pt has been coughing with po intake at home, both solids and liquids.  After medications given- via approx 4 ounces of applesauce, pt presented with delayed coughing - ? Due to pharyngeal retention?  He quickly closed his eyes and nods off during session, maintaining alertness for only approximtely 8 minutes of entire 40 minute session.    Recommend only floor stock items including floor stock items only today when FULLY alert and tolerating (applesauce, icecream, nectar juices and thin water). Do not recommend repeat MBS as do not anticipate it will change outcomes and wife would like pt to return to thin liquids once dc'd.  SLP informed spouse that pt has likely been having some chronic aspiration and this will not be prevented but mitigated. Reviewed importance of pt not being fed if coughing or not alert due to increased pulmonary ramifications especially with current status.  From chart review, it appears pt had undergone several palliative meetings.      SLP will follow up next date for po readiness.  Educated wife using teach back and precautions.   HPI HPI: 87 year old man with medical history including history of a stroke,  vascular dementia admitted to [facility] after having shaking/tremors and AMS x3 days and now dyspneic.  Pt diagnosed with COVID one month ago.  hyperlipidemia, orthostatic hypotension, history of CABG brought to the emergency room with altered mental status.  Reportedly, he has been suffering from shaking and tremors for about 3 days.  Wife reports pt had a stroke after hip fx and surgery 2021 with resultant right sided eakenss and dysphagia.  Slurred speech and confusion also reported.  Per MD note, pt has severe back pain problems and was recently given tramadol. He was also diagnosed with COVID-19 at home test 1 month ago, wife reports negative testing 2 weeks after.  Swallow and speech eval ordered.  Chest imaging concering for progressive opacities.      SLP Plan  Continue with current plan of care       Recommendations  Diet recommendations: Thin liquid;Nectar-thick liquid (floor stock items only (applesauce, icecream, nectar juices and thin water)) Liquids provided via: Straw;Cup Medication Administration: Whole meds with puree Supervision: Full supervision/cueing for compensatory strategies Compensations: Other (Comment);Small sips/bites;Slow rate (assure pt swallows before giving more) Postural Changes and/or Swallow Maneuvers: Seated upright 90 degrees                Oral Care Recommendations: Oral care QID SLP Visit Diagnosis: Dysphagia, oropharyngeal phase (R13.12) Plan: Continue with current plan of care       GO              Dennis Lime, MS [facility] SLP [place] Office [phone] Pager [phone]  [name] 11/20/2020, 11:50 AM

## 2020-11-20 NOTE — Procedures (Signed)
Patient Name: [name]  MRN: [id number]  Epilepsy Attending: Lora Havens  Referring Physician/Provider: Dr Barb Merino Date: 11/20/2020 Duration: 22.39 mins  Patient history: 87yo M with ams. EEG to evaluate for seizure  Level of alertness:  lethargic   AEDs during EEG study: None  Technical aspects: This EEG study was done with scalp electrodes positioned according to the 10-20 International system of electrode placement. Electrical activity was acquired at a sampling rate of '500Hz'$  and reviewed with a high frequency filter of '70Hz'$  and a low frequency filter of '1Hz'$ . EEG data were recorded continuously and digitally stored.   Description: EEG showed continuous generalized 5 to 8 Hz theta-alpha activity as well as 2-'3Hz'$  delta slowing. Hyperventilation and photic stimulation were not performed.     ABNORMALITY - Continuous slow, generalized  IMPRESSION: This study is suggestive of moderate diffuse encephalopathy, nonspecific etiology. No seizures or epileptiform discharges were seen throughout the recording.  Takeshia Wenk Barbra Sarks

## 2020-11-21 ENCOUNTER — Inpatient Hospital Stay (HOSPITAL_COMMUNITY): Payer: Medicare Other

## 2020-11-21 DIAGNOSIS — G9341 Metabolic encephalopathy: Secondary | ICD-10-CM | POA: Diagnosis not present

## 2020-11-21 LAB — COMPREHENSIVE METABOLIC PANEL
ALT: 75 U/L — ABNORMAL HIGH (ref 0–44)
AST: 59 U/L — ABNORMAL HIGH (ref 15–41)
Albumin: 3 g/dL — ABNORMAL LOW (ref 3.5–5.0)
Alkaline Phosphatase: 93 U/L (ref 38–126)
Anion gap: 10 (ref 5–15)
BUN: 45 mg/dL — ABNORMAL HIGH (ref 8–23)
CO2: 26 mmol/L (ref 22–32)
Calcium: 8.4 mg/dL — ABNORMAL LOW (ref 8.9–10.3)
Chloride: 110 mmol/L (ref 98–111)
Creatinine, Ser: 1.68 mg/dL — ABNORMAL HIGH (ref 0.61–1.24)
GFR, Estimated: 40 mL/min — ABNORMAL LOW (ref >60)
Glucose, Bld: 173 mg/dL — ABNORMAL HIGH (ref 70–99)
Potassium: 3.9 mmol/L (ref 3.5–5.1)
Sodium: 146 mmol/L — ABNORMAL HIGH (ref 135–145)
Total Bilirubin: 0.9 mg/dL (ref 0.3–1.2)
Total Protein: 5.9 g/dL — ABNORMAL LOW (ref 6.5–8.1)

## 2020-11-21 LAB — CBC WITH DIFFERENTIAL/PLATELET
Abs Immature Granulocytes: 0.06 10*3/uL (ref 0.00–0.07)
Basophils Absolute: 0 10*3/uL (ref 0.0–0.1)
Basophils Relative: 0 %
Eosinophils Absolute: 0 10*3/uL (ref 0.0–0.5)
Eosinophils Relative: 0 %
HCT: 31.6 % — ABNORMAL LOW (ref 39.0–52.0)
Hemoglobin: 9.6 g/dL — ABNORMAL LOW (ref 13.0–17.0)
Immature Granulocytes: 1 %
Lymphocytes Relative: 9 %
Lymphs Abs: 0.8 10*3/uL (ref 0.7–4.0)
MCH: 32 pg (ref 26.0–34.0)
MCHC: 30.4 g/dL (ref 30.0–36.0)
MCV: 105.3 fL — ABNORMAL HIGH (ref 80.0–100.0)
Monocytes Absolute: 0.6 10*3/uL (ref 0.1–1.0)
Monocytes Relative: 7 %
Neutro Abs: 7.1 10*3/uL (ref 1.7–7.7)
Neutrophils Relative %: 83 %
Platelets: 150 10*3/uL (ref 150–400)
RBC: 3 MIL/uL — ABNORMAL LOW (ref 4.22–5.81)
RDW: 17.2 % — ABNORMAL HIGH (ref 11.5–15.5)
WBC: 8.6 10*3/uL (ref 4.0–10.5)
nRBC: 0.5 % — ABNORMAL HIGH (ref 0.0–0.2)

## 2020-11-21 LAB — PROCALCITONIN: Procalcitonin: 0.1 ng/mL

## 2020-11-21 LAB — FERRITIN: Ferritin: 230 ng/mL (ref 24–336)

## 2020-11-21 LAB — URINE CULTURE: Culture: NO GROWTH

## 2020-11-21 LAB — GLUCOSE, CAPILLARY
Glucose-Capillary: 159 mg/dL — ABNORMAL HIGH (ref 70–99)
Glucose-Capillary: 166 mg/dL — ABNORMAL HIGH (ref 70–99)
Glucose-Capillary: 171 mg/dL — ABNORMAL HIGH (ref 70–99)
Glucose-Capillary: 203 mg/dL — ABNORMAL HIGH (ref 70–99)
Glucose-Capillary: 206 mg/dL — ABNORMAL HIGH (ref 70–99)

## 2020-11-21 LAB — C-REACTIVE PROTEIN: CRP: 9.8 mg/dL — ABNORMAL HIGH (ref <1.0)

## 2020-11-21 LAB — VITAMIN B1: Vitamin B1 (Thiamine): 119.5 nmol/L (ref 66.5–200.0)

## 2020-11-21 LAB — D-DIMER, QUANTITATIVE: D-Dimer, Quant: 16.05 ug/mL-FEU — ABNORMAL HIGH (ref 0.00–0.50)

## 2020-11-21 MED ORDER — SODIUM CHLORIDE 0.9 % IV SOLN
3.0000 g | Freq: Four times a day (QID) | INTRAVENOUS | Status: DC
  Administered 2020-11-21 – 2020-11-22 (×5): 3 g via INTRAVENOUS
  Filled 2020-11-21: qty 8
  Filled 2020-11-21 (×2): qty 3
  Filled 2020-11-21: qty 8
  Filled 2020-11-21 (×2): qty 3

## 2020-11-21 MED ORDER — LOSARTAN POTASSIUM 50 MG PO TABS
50.0000 mg | ORAL_TABLET | Freq: Every day | ORAL | Status: DC
  Administered 2020-11-22: 50 mg via ORAL
  Filled 2020-11-21 (×2): qty 1

## 2020-11-21 MED ORDER — ACETAMINOPHEN 160 MG/5ML PO SOLN
1000.0000 mg | Freq: Four times a day (QID) | ORAL | Status: DC
  Administered 2020-11-21 – 2020-11-22 (×2): 1000 mg via ORAL
  Filled 2020-11-21 (×3): qty 40.6

## 2020-11-21 MED ORDER — HYDRALAZINE HCL 25 MG PO TABS: 25.0000 mg | ORAL_TABLET | Freq: Four times a day (QID) | ORAL | Status: DC | PRN

## 2020-11-21 MED ORDER — HYDRALAZINE HCL 20 MG/ML IJ SOLN
10.0000 mg | INTRAMUSCULAR | Status: DC | PRN
  Administered 2020-11-21 – 2020-11-22 (×4): 10 mg via INTRAVENOUS
  Filled 2020-11-21 (×4): qty 1

## 2020-11-21 NOTE — Progress Notes (Addendum)
Noted oxygen saturation at mid 80's with 3LPM oxygen per [place]. Shifted to HFNC and titrated according to needs. Attained oxygen saturation of 98-99% with HFNC at 15 LPM. Provider updated.   Update 0600     Patient tolerated HFNC at 6Liters, Spo2 of 92%.

## 2020-11-21 NOTE — Progress Notes (Signed)
  Speech Language Pathology Treatment: Dysphagia  Patient Details Name: [name] MRN: [id number] DOB: 1933-10-30 Today's Date: 11/21/2020 Time: [id number] SLP Time Calculation (min) (ACUTE ONLY): 12 min  Assessment / Plan / Recommendation Clinical Impression  Pt was seen for skilled ST targeting diet tolerance and PO trials.  Pt was encountered asleep in bed and had difficulty rousing despite max verbal and tactile stimulation.  Oral care was completed and pt exhibited lingual and labial movements in response to the swab in his oral cavity.  He was able to rouse for approximately 1 minute throughout this session, in which time he was seen with a trial of thin liquid via spoon.  He exhibited some lingual and labial movement, but labial closure was not achieved and neither AP transit nor swallow initiation was attempted.  Bolus was eventually suctioned from the pt's oral cavity secondary to aspiration concerns.  RN reported that pt had consumed puree without difficulty earlier in the day when he was alert, but that he had a desaturation event when consuming liquid tylenol, concerning for aspiration.  Recommend continuation of only floor stock items when FULLY alert and tolerating (applesauce, ice cream, nectar juices and thin water) and frequent oral care.  SLP will f/u per POC.    HPI HPI: 87 year old man with medical history including history of a stroke, vascular dementia admitted to [facility] after having shaking/tremors and AMS x3 days and now dyspneic.  Pt diagnosed with COVID one month ago.  hyperlipidemia, orthostatic hypotension, history of CABG brought to the emergency room with altered mental status.  Reportedly, he has been suffering from shaking and tremors for about 3 days.  Wife reports pt had a stroke after hip fx and surgery 2021 with resultant right sided eakenss and dysphagia.  Slurred speech and confusion also reported.  Per MD note, pt has severe back pain problems and was recently  given tramadol. He was also diagnosed with COVID-19 at home test 1 month ago, wife reports negative testing 2 weeks after.  Swallow and speech eval ordered.  Chest imaging concering for progressive opacities.      SLP Plan  Continue with current plan of care       Recommendations  Diet recommendations: Other(comment) (floor stock puree, thin water, and nectar-thick liquids) Liquids provided via: Straw;Cup Medication Administration: Crushed with puree Supervision: Full supervision/cueing for compensatory strategies Compensations: Other (Comment);Small sips/bites;Slow rate (ensure that pt fully swallows before offering next bite/sip) Postural Changes and/or Swallow Maneuvers: Seated upright 90 degrees                Oral Care Recommendations: Oral care QID;Staff/trained caregiver to provide oral care Follow up Recommendations: 24 hour supervision/assistance SLP Visit Diagnosis: Dysphagia, oropharyngeal phase (R13.12) Plan: Continue with current plan of care       GO               Colin Mulders M.S., [place] Office: [phone]  [place] 11/21/2020, 2:52 PM

## 2020-11-21 NOTE — Progress Notes (Signed)
Pharmacy Antibiotic Note  [name] is a 87 y.o. male admitted on 11/08/2020 with  aspiration PNA  .  Pharmacy has been consulted for Unasyn dosing.  Today, the renal function has improved as Scr down to 1.68   Plan: Adjust Unasyn dose to 3 gr IV q6h  Monitor clinical course, renal function, cultures as available   Height: 6' (182.9 cm) Weight: 89.8 kg (198 lb) IBW/kg (Calculated) : 77.6  Temp (24hrs), Avg:98.4 F (36.9 C), Min:98.1 F (36.7 C), Max:98.6 F (37 C)  Recent Labs  Lab 11/21/2020 2124 11/19/20 0359 11/20/20 0337 11/21/20 0316  WBC 7.6 9.1 9.5 8.6  CREATININE 2.45* 2.07* 2.04* 1.68*     Estimated Creatinine Clearance: 35.3 mL/min (A) (by C-G formula based on SCr of 1.68 mg/dL (H)).    Allergies  Allergen Reactions   Other Other (See Comments)    Pt reports allergic to a medication but unsure of name or type of med   Cymbalta [Duloxetine Hcl] Other (See Comments)    Dizziness, hallucinations.    Keflex [Cephalexin] Other (See Comments)    dizziness   Simvastatin Other (See Comments)    Joint pain   Sudafed [Pseudoephedrine] Other (See Comments)    Dizziness     Royetta Asal, PharmD, BCPS 11/21/2020 10:07 AM

## 2020-11-21 NOTE — Progress Notes (Signed)
Unable to give liquid tylenol due to patient coughing after one sip, but pt took pills crushed in applesauce without any issue.

## 2020-11-21 NOTE — Progress Notes (Signed)
PROGRESS NOTE    SHEPARD SADD  [id number] DOB: 1934/03/12 DOA: 11/05/2020 PCP: [name], MD    Brief Narrative:  87 year old gentleman with extensive medical history including history of multiple stroke, vascular dementia, hyperlipidemia, orthostatic hypotension, history of CABG brought to the emergency room with altered mental status.  Reportedly, he has been suffering from shaking and tremors for about 3 days.  No demonstrable seizure-like episodes.  No incontinence.  Also noted to have slurred speech and confusion. Patient has severe back pain problems and was recently given tramadol, last dose taken was 7/18.  Wife reports possible correlation with the use of tramadol. He was also diagnosed with COVID-19 at home test 1 month ago, wife reports negative testing 2 weeks after that. Admitted with altered mental status and shortness of breath. Patient was treated for enterococcal UTI with amoxicillin and dose was extended for presumed prostatitis.   Assessment & Plan:   Active Problems:   Acute metabolic encephalopathy  Acute metabolic encephalopathy: In a patient with underlying vascular dementia and advanced debility.  Multifactorial encephalopathy. Recent COVID infection, UTI, prostatitis, suspected a stroke and aspiration pneumonia. Admission CT scan was without any acute findings.  Trying to arrange for MRI today. Ammonia was normal.  Vitamin levels are normal.  Carbon oxide level was normal. Mental status is much improving today. EEG with no evidence of seizure. Tremors are improving.  Acute hypoxemic respiratory failure: With recent history of COVID-19 infection.  More than 4 weeks since initial infections.  Chest x-ray consistent with asymmetrical pneumonia.  CRP 18.4.  More in favor for aspiration pneumonia or pneumonitis. Started on Unasyn, good clinical response. Since he is more awake today, will start chest physiotherapy as much possible. Trial of steroids  for possible post-COVID syndrome. Keep on oxygen to keep saturation more than 90%.  Dementia: On Wellbutrin Namenda and Seroquel that will be resumed.  CKD stage IIIb: At about baseline.  Continue to monitor.  Enterococcal UTI/prostatitis: On amoxicillin.  Changed to Unasyn until clinical improvement.  Repeat cultures negative so far.  Hypothyroidism: Synthroid as above.  Acute on chronic back pain: Patient has severe back pain problems.  Struggling with pain management.  This may be contributing to altered mentation.  Using around-the-clock Tylenol and a small dose of morphine with much improvement.  Also using lidocaine patch.  Start mobilizing.   Essential hypertension: Blood pressures elevated now.  Uses Benicar as needed at home.  Will resume losartan 50 mg daily.  We will start controlling blood pressure with a goal to normalize it.  Discussed with patient's wife at the bedside in details, some clinical improvement today.  We will involve palliative care team to continue education, goal of care discussion.  DVT prophylaxis: SCD's Start: 11/18/20 1000   Code Status: DNR Family Communication: Wife at the bedside Disposition Plan: Status is: Inpatient  Remains inpatient appropriate because:Altered mental status and Inpatient level of care appropriate due to severity of illness  Dispo: The patient is from: Home              Anticipated d/c is to: Skilled nursing facility.              Patient currently is not medically stable to d/c.   Difficult to place patient No  Consultants:  None  Procedures:  None  Antimicrobials:  Amoxicillin 7/20-7/21 Unasyn 7/21---   Subjective: Patient seen and examined.  No overnight events.  Mostly sleepy but today he is more alert on voice  and following commands.  He tells me that he is at [facility].  He cannot go into details.  Falls back to sleep.  No more tremors. Able to take applesauce by mouth.  Objective: Vitals:    11/21/20 0416 11/21/20 0457 11/21/20 0500 11/21/20 0624  BP:    (!) 187/128  Pulse:      Resp:      Temp:  98.3 F (36.8 C)  98.6 F (37 C)  TempSrc:  Axillary  Axillary  SpO2: 100% 99% 92%   Weight:      Height:        Intake/Output Summary (Last 24 hours) at 11/21/2020 1044 Last data filed at 11/21/2020 0840 Gross per 24 hour  Intake 800 ml  Output 700 ml  Net 100 ml   Filed Weights   11/18/20 1751  Weight: 89.8 kg    Examination:  General exam: Patient looks frail and debilitated.   Mostly sleepy.  Moving all extremities equally.  Today following simple commands and verbalizing. Respiratory system: Bilateral clear.  Some conducted airway sounds.  Remains on some supplemental oxygen, however looks very comfortable. Cardiovascular system: S1 & S2 heard, RRR.  No edema.   Gastrointestinal system: Soft and nontender. Extremities:  Mostly sleepy. No obvious cranial nerve deficits. Generalized weakness.  Bilateral equal. No jerking or shaking movements today.    Data Reviewed: I have personally reviewed following labs and imaging studies  CBC: Recent Labs  Lab 11/03/2020 2124 11/19/20 0359 11/20/20 0337 11/21/20 0316  WBC 7.6 9.1 9.5 8.6  NEUTROABS  --  7.2 7.5 7.1  HGB 10.5* 9.7* 9.9* 9.6*  HCT 34.1* 31.6* 31.7* 31.6*  MCV 106.2* 104.3* 103.6* 105.3*  PLT 144* 167 195 [id number]   Basic Metabolic Panel: Recent Labs  Lab 10/31/2020 2124 11/19/20 0359 11/20/20 0337 11/21/20 0316  NA 142 143 144 146*  K 4.3 4.2 4.2 3.9  CL 106 106 110 110  CO2 '26 26 25 26  '$ GLUCOSE 129* 140* 169* 173*  BUN 33* 36* 45* 45*  CREATININE 2.45* 2.07* 2.04* 1.68*  CALCIUM 8.6* 8.8* 8.6* 8.4*   GFR: Estimated Creatinine Clearance: 35.3 mL/min (A) (by C-G formula based on SCr of 1.68 mg/dL (H)). Liver Function Tests: Recent Labs  Lab 10/30/2020 2124 11/19/20 0359 11/20/20 0337 11/21/20 0316  AST 20 21 83* 59*  ALT 9 12 64* 75*  ALKPHOS 112 98 101 93  BILITOT 0.7 1.1 1.0 0.9   PROT 6.3* 6.3* 6.2* 5.9*  ALBUMIN 3.3* 3.3* 3.2* 3.0*   No results for input(s): LIPASE, AMYLASE in the last 168 hours. Recent Labs  Lab 11/19/20 0940  AMMONIA 13   Coagulation Profile: Recent Labs  Lab 11/03/2020 2124  INR 1.1   Cardiac Enzymes: No results for input(s): CKTOTAL, CKMB, CKMBINDEX, TROPONINI in the last 168 hours. BNP (last 3 results) No results for input(s): PROBNP in the last 8760 hours. HbA1C: Recent Labs    11/19/20 0619  HGBA1C 6.0*   CBG: Recent Labs  Lab 11/20/20 1206 11/20/20 1742 11/20/20 2008 11/21/20 0011 11/21/20 0756  GLUCAP 211* 183* 215* 159* 166*   Lipid Profile: Recent Labs    11/19/20 0619  CHOL 136  HDL 44  LDLCALC 82  TRIG 50  CHOLHDL 3.1   Thyroid Function Tests: No results for input(s): TSH, T4TOTAL, FREET4, T3FREE, THYROIDAB in the last 72 hours. Anemia Panel: Recent Labs    11/18/20 1047 11/18/20 1048 11/19/20 0359 11/20/20 [id number] 11/21/20 0316  [id number]  2,471*  --   --   --   --   FOLATE  --  11.6  --   --   --   FERRITIN  --   --    < > 288 230   < > = values in this interval not displayed.   Sepsis Labs: Recent Labs  Lab 11/20/20 1018 11/21/20 0316  PROCALCITON 0.10 0.10    Recent Results (from the past 240 hour(s))  Urine Culture     Status: Abnormal   Collection Time: 11/11/20 10:59 AM   Specimen: Urine  Result Value Ref Range Status   MICRO NUMBER: [id number]  Final   SPECIMEN QUALITY: Adequate  Final   Sample Source URINE  Final   STATUS: FINAL  Final   ISOLATE 1: Enterococcus faecalis (A)  Final    Comment: 10,000-49,000 CFU/mL of Enterococcus faecalis      Susceptibility   Enterococcus faecalis - URINE CULTURE POSITIVE 1    AMPICILLIN <=2 Sensitive     VANCOMYCIN 1 Sensitive     NITROFURANTOIN* <=16 Sensitive      * Legend: S = Susceptible  I = Intermediate R = Resistant  NS = Not susceptible * = Not tested  NR = Not reported **NN = See antimicrobic comments   Resp Panel by RT-PCR  (Flu A&B, Covid) Nasopharyngeal Swab     Status: Abnormal   Collection Time: 11/09/2020 10:05 PM   Specimen: Nasopharyngeal Swab; Nasopharyngeal(NP) swabs in vial transport medium  Result Value Ref Range Status   SARS Coronavirus 2 by RT PCR POSITIVE (A) NEGATIVE Final    Comment: RESULT CALLED TO, READ BACK BY AND VERIFIED WITH: BONIS, RN @ [id number] ON 11/18/20 C VARNER (NOTE) SARS-CoV-2 target nucleic acids are DETECTED.  The SARS-CoV-2 RNA is generally detectable in upper respiratory specimens during the acute phase of infection. Positive results are indicative of the presence of the identified virus, but do not rule out bacterial infection or co-infection with other pathogens not detected by the test. Clinical correlation with patient history and other diagnostic information is necessary to determine patient infection status. The expected result is Negative.  Fact Sheet for Patients: EntrepreneurPulse.com.au  Fact Sheet for Healthcare Providers: IncredibleEmployment.be  This test is not yet approved or cleared by the [place] FDA and  has been authorized for detection and/or diagnosis of SARS-CoV-2 by FDA under an Emergency Use Authorization (EUA).  This EUA will remain in effect (meaning this test can  be used) for the duration of  the COVID-19 declaration under Section 564(b)(1) of the Act, 21 U.S.C. section 360bbb-3(b)(1), unless the authorization is terminated or revoked sooner.     Influenza A by PCR NEGATIVE NEGATIVE Final   Influenza B by PCR NEGATIVE NEGATIVE Final    Comment: (NOTE) The Xpert Xpress SARS-CoV-2/FLU/RSV plus assay is intended as an aid in the diagnosis of influenza from Nasopharyngeal swab specimens and should not be used as a sole basis for treatment. Nasal washings and aspirates are unacceptable for Xpert Xpress SARS-CoV-2/FLU/RSV testing.  Fact Sheet for  Patients: EntrepreneurPulse.com.au  Fact Sheet for Healthcare Providers: IncredibleEmployment.be  This test is not yet approved or cleared by the [place] FDA and has been authorized for detection and/or diagnosis of SARS-CoV-2 by FDA under an Emergency Use Authorization (EUA). This EUA will remain in effect (meaning this test can be used) for the duration of the COVID-19 declaration under Section 564(b)(1) of the Act, 21 U.S.C. section 360bbb-3(b)(1), unless the authorization  is terminated or revoked.  Performed at [facility], [place] [address] [place], [place] [address]   Urine Culture     Status: None   Collection Time: 11/18/20  4:05 AM   Specimen: Urine, Clean Catch  Result Value Ref Range Status   Specimen Description   Final    URINE, CLEAN CATCH Performed at [facility], [place] [address], [place], [place] [address]    Special Requests   Final    NONE Performed at [facility], [place] [address], [place], [place] [address]    Culture   Final    NO GROWTH Performed at [facility], [place] [address] [place], [place] [address]    Report Status 11/19/2020 FINAL  Final  Culture, blood (routine x 2)     Status: None (Preliminary result)   Collection Time: 11/19/20  9:40 AM   Specimen: BLOOD  Result Value Ref Range Status   Specimen Description   Final    BLOOD LEFT ANTECUBITAL Performed at [place] [address] [place], [place] [address]    Special Requests   Final    BOTTLES DRAWN AEROBIC AND ANAEROBIC Blood Culture adequate volume Performed at [place] [address] [place], [place] [address]    Culture   Final    NO GROWTH 2 DAYS Performed at [facility] [address], [place], [place] [address]    Report Status PENDING  Incomplete  Culture, blood (routine x 2)     Status: None (Preliminary result)    Collection Time: 11/19/20  9:40 AM   Specimen: BLOOD RIGHT HAND  Result Value Ref Range Status   Specimen Description   Final    BLOOD RIGHT HAND Performed at [facility] [address] [place], [place] [address]    Special Requests   Final    BOTTLES DRAWN AEROBIC ONLY Blood Culture adequate volume Performed at [place] [address] [place], [place] [address]    Culture   Final    NO GROWTH 2 DAYS Performed at [facility] [address] [place], [place] [address]    Report Status PENDING  Incomplete  Urine Culture     Status: None   Collection Time: 11/19/20  5:49 PM   Specimen: Urine, Clean Catch  Result Value Ref Range Status   Specimen Description   Final    URINE, CLEAN CATCH Performed at [facility], [place] [address], [place], [place] [address]    Special Requests   Final    NONE Performed at [facility] [place] [address], [place], [place] [address]    Culture   Final    NO GROWTH Performed at [facility], [place] [address], [place], [place] [address]    Report Status 11/21/2020 FINAL  Final         Radiology Studies: EEG adult  Result Date: December 12, 2020 Lora Havens, MD     12-12-2020  4:53 PM Patient Name: [name] MRN: [id number] Epilepsy Attending: Lora Havens Referring Physician/Provider: Dr Barb Merino Date: 12-Dec-2020 Duration: 22.39 mins Patient history: 87yo M with ams. EEG to evaluate for seizure Level of alertness:  lethargic AEDs during EEG study: None Technical aspects: This EEG study was done with scalp electrodes positioned according to the 10-20 International system of electrode placement. Electrical activity was acquired at a sampling rate  of '500Hz'$  and reviewed with a high frequency filter of '70Hz'$  and a low frequency filter of '1Hz'$ . EEG data were recorded continuously and digitally stored. Description: EEG showed continuous generalized 5 to  8 Hz theta-alpha activity as well as 2-'3Hz'$  delta slowing. Hyperventilation and photic stimulation were not performed.   ABNORMALITY - Continuous slow, generalized IMPRESSION: This study is suggestive of moderate diffuse encephalopathy, nonspecific etiology. No seizures or epileptiform discharges were seen throughout the recording. Priyanka Barbra Sarks        Scheduled Meds:  acetaminophen (TYLENOL) oral liquid 160 mg/5 mL  1,000 mg Oral Q6H   aspirin EC  81 mg Oral Daily   buPROPion  150 mg Oral Daily   dexamethasone (DECADRON) injection  6 mg Intravenous Q24H   fludrocortisone  0.1 mg Oral BID   Ipratropium-Albuterol  1 puff Inhalation Q6H   levothyroxine  25 mcg Oral Q0600   losartan  50 mg Oral Daily   memantine  10 mg Oral BID   multivitamin with minerals  1 tablet Oral Daily   pantoprazole  40 mg Oral Daily   pravastatin  40 mg Oral Daily   QUEtiapine  25 mg Oral TID   Continuous Infusions:  ampicillin-sulbactam (UNASYN) IV     dextrose 5 % and 0.9% NaCl 100 mL/hr at 11/21/20 0411     LOS: 3 days    Time spent: 30 minutes    Barb Merino, MD [facility] Pager [phone]

## 2020-11-22 DIAGNOSIS — G9341 Metabolic encephalopathy: Secondary | ICD-10-CM | POA: Diagnosis not present

## 2020-11-22 LAB — CBC WITH DIFFERENTIAL/PLATELET
Abs Immature Granulocytes: 0.45 10*3/uL — ABNORMAL HIGH (ref 0.00–0.07)
Basophils Absolute: 0 10*3/uL (ref 0.0–0.1)
Basophils Relative: 0 %
Eosinophils Absolute: 0 10*3/uL (ref 0.0–0.5)
Eosinophils Relative: 0 %
HCT: 31.8 % — ABNORMAL LOW (ref 39.0–52.0)
Hemoglobin: 9.9 g/dL — ABNORMAL LOW (ref 13.0–17.0)
Immature Granulocytes: 3 %
Lymphocytes Relative: 9 %
Lymphs Abs: 1.3 10*3/uL (ref 0.7–4.0)
MCH: 32.5 pg (ref 26.0–34.0)
MCHC: 31.1 g/dL (ref 30.0–36.0)
MCV: 104.3 fL — ABNORMAL HIGH (ref 80.0–100.0)
Monocytes Absolute: 1.4 10*3/uL — ABNORMAL HIGH (ref 0.1–1.0)
Monocytes Relative: 10 %
Neutro Abs: 11.7 10*3/uL — ABNORMAL HIGH (ref 1.7–7.7)
Neutrophils Relative %: 78 %
Platelets: 126 10*3/uL — ABNORMAL LOW (ref 150–400)
RBC: 3.05 MIL/uL — ABNORMAL LOW (ref 4.22–5.81)
RDW: 18 % — ABNORMAL HIGH (ref 11.5–15.5)
WBC: 14.9 10*3/uL — ABNORMAL HIGH (ref 4.0–10.5)
nRBC: 2.7 % — ABNORMAL HIGH (ref 0.0–0.2)

## 2020-11-22 LAB — COMPREHENSIVE METABOLIC PANEL
ALT: 66 U/L — ABNORMAL HIGH (ref 0–44)
AST: 44 U/L — ABNORMAL HIGH (ref 15–41)
Albumin: 3 g/dL — ABNORMAL LOW (ref 3.5–5.0)
Alkaline Phosphatase: 91 U/L (ref 38–126)
Anion gap: 11 (ref 5–15)
BUN: 52 mg/dL — ABNORMAL HIGH (ref 8–23)
CO2: 23 mmol/L (ref 22–32)
Calcium: 8.6 mg/dL — ABNORMAL LOW (ref 8.9–10.3)
Chloride: 115 mmol/L — ABNORMAL HIGH (ref 98–111)
Creatinine, Ser: 1.86 mg/dL — ABNORMAL HIGH (ref 0.61–1.24)
GFR, Estimated: 35 mL/min — ABNORMAL LOW (ref >60)
Glucose, Bld: 184 mg/dL — ABNORMAL HIGH (ref 70–99)
Potassium: 4.4 mmol/L (ref 3.5–5.1)
Sodium: 149 mmol/L — ABNORMAL HIGH (ref 135–145)
Total Bilirubin: 1.8 mg/dL — ABNORMAL HIGH (ref 0.3–1.2)
Total Protein: 5.8 g/dL — ABNORMAL LOW (ref 6.5–8.1)

## 2020-11-22 LAB — FERRITIN: Ferritin: 210 ng/mL (ref 24–336)

## 2020-11-22 LAB — GLUCOSE, CAPILLARY
Glucose-Capillary: 198 mg/dL — ABNORMAL HIGH (ref 70–99)
Glucose-Capillary: 170 mg/dL — ABNORMAL HIGH (ref 70–99)
Glucose-Capillary: 222 mg/dL — ABNORMAL HIGH (ref 70–99)

## 2020-11-22 LAB — D-DIMER, QUANTITATIVE: D-Dimer, Quant: >20 ug/mL-FEU — ABNORMAL HIGH (ref 0.00–0.50)

## 2020-11-22 LAB — C-REACTIVE PROTEIN: CRP: 9.5 mg/dL — ABNORMAL HIGH (ref <1.0)

## 2020-11-22 MED ORDER — HALOPERIDOL 0.5 MG PO TABS
0.5000 mg | ORAL_TABLET | ORAL | Status: DC | PRN
  Filled 2020-11-22: qty 1

## 2020-11-22 MED ORDER — ONDANSETRON HCL 4 MG/2ML IJ SOLN: 4.0000 mg | Freq: Four times a day (QID) | INTRAMUSCULAR | Status: DC | PRN

## 2020-11-22 MED ORDER — HALOPERIDOL LACTATE 2 MG/ML PO CONC
0.5000 mg | ORAL | Status: DC | PRN
  Filled 2020-11-22: qty 0.3

## 2020-11-22 MED ORDER — MORPHINE SULFATE (PF) 2 MG/ML IV SOLN
2.0000 mg | INTRAVENOUS | Status: DC | PRN
  Administered 2020-11-22 – 2020-11-23 (×5): 2 mg via INTRAVENOUS
  Filled 2020-11-22 (×5): qty 1

## 2020-11-22 MED ORDER — LORAZEPAM 2 MG/ML IJ SOLN
1.0000 mg | INTRAMUSCULAR | Status: DC | PRN
  Administered 2020-11-23: 1 mg via INTRAVENOUS
  Filled 2020-11-22: qty 1

## 2020-11-22 MED ORDER — GLYCOPYRROLATE 0.2 MG/ML IJ SOLN: 0.2000 mg | INTRAMUSCULAR | Status: DC | PRN

## 2020-11-22 MED ORDER — DIPHENHYDRAMINE HCL 50 MG/ML IJ SOLN: 12.5000 mg | INTRAMUSCULAR | Status: DC | PRN

## 2020-11-22 MED ORDER — LORAZEPAM 2 MG/ML IJ SOLN: 0.5000 mg | INTRAMUSCULAR | Status: DC | PRN

## 2020-11-22 MED ORDER — SODIUM CHLORIDE 0.9% FLUSH: 3.0000 mL | INTRAVENOUS | Status: DC | PRN

## 2020-11-22 MED ORDER — GLYCOPYRROLATE 1 MG PO TABS
1.0000 mg | ORAL_TABLET | ORAL | Status: DC | PRN
  Filled 2020-11-22: qty 1

## 2020-11-22 MED ORDER — FLEET ENEMA 7-19 GM/118ML RE ENEM: 1.0000 | ENEMA | Freq: Every day | RECTAL | Status: DC | PRN

## 2020-11-22 MED ORDER — ONDANSETRON 4 MG PO TBDP: 4.0000 mg | ORAL_TABLET | Freq: Four times a day (QID) | ORAL | Status: DC | PRN

## 2020-11-22 MED ORDER — POLYVINYL ALCOHOL 1.4 % OP SOLN: 1.0000 [drp] | Freq: Four times a day (QID) | OPHTHALMIC | Status: DC | PRN

## 2020-11-22 MED ORDER — LORAZEPAM 2 MG/ML PO CONC: 1.0000 mg | ORAL | Status: DC | PRN

## 2020-11-22 MED ORDER — LORAZEPAM 1 MG PO TABS: 1.0000 mg | ORAL_TABLET | ORAL | Status: DC | PRN

## 2020-11-22 MED ORDER — MORPHINE SULFATE (PF) 2 MG/ML IV SOLN: 2.0000 mg | INTRAVENOUS | Status: DC | PRN

## 2020-11-22 MED ORDER — ACETAMINOPHEN 325 MG PO TABS: 650.0000 mg | ORAL_TABLET | Freq: Four times a day (QID) | ORAL | Status: DC | PRN

## 2020-11-22 MED ORDER — SODIUM CHLORIDE 0.9% FLUSH
3.0000 mL | Freq: Two times a day (BID) | INTRAVENOUS | Status: DC
  Administered 2020-11-22: 3 mL via INTRAVENOUS

## 2020-11-22 MED ORDER — SODIUM CHLORIDE 0.9 % IV SOLN: 250.0000 mL | INTRAVENOUS | Status: DC | PRN

## 2020-11-22 MED ORDER — ACETAMINOPHEN 650 MG RE SUPP: 650.0000 mg | Freq: Four times a day (QID) | RECTAL | Status: DC | PRN

## 2020-11-22 MED ORDER — BISACODYL 10 MG RE SUPP: 10.0000 mg | Freq: Every day | RECTAL | Status: DC | PRN

## 2020-11-22 MED ORDER — ALBUTEROL SULFATE (2.5 MG/3ML) 0.083% IN NEBU: 2.5000 mg | INHALATION_SOLUTION | RESPIRATORY_TRACT | Status: DC | PRN

## 2020-11-22 MED ORDER — HALOPERIDOL LACTATE 5 MG/ML IJ SOLN: 0.5000 mg | INTRAMUSCULAR | Status: DC | PRN

## 2020-11-22 NOTE — TOC Progression Note (Signed)
Transition of Care [facility]) - Progression Note    Patient Details  Name: [name] MRN: [id number] Date of Birth: 28-Feb-1934  Transition of Care [facility]) CM/SW Contact  Purcell Mouton, RN Phone Number: 11/22/2020, 3:56 PM  Clinical Narrative:     Will continue to follow pt for discharge disposition.   Expected Discharge Plan: [place] Barriers to Discharge: No Barriers Identified  Expected Discharge Plan and Services Expected Discharge Plan: [place] arrangements for the past 2 months: Single Family Home                                       Social Determinants of Health (SDOH) Interventions    Readmission Risk Interventions No flowsheet data found.

## 2020-11-22 NOTE — Plan of Care (Signed)
Patient is confused

## 2020-11-22 NOTE — Progress Notes (Signed)
SLP Cancellation Note  Patient Details Name: [name] MRN: [id number] DOB: December 21, 1933   Cancelled treatment:       Reason Eval/Treat Not Completed: Patient unavailable.  Pt is currently with nursing staff; SLP will f/u as schedule allows.    Elvia Collum Eulan Heyward 11/22/2020, 3:14 PM

## 2020-11-22 NOTE — Progress Notes (Signed)
PROGRESS NOTE    [name]  [id number] DOB: Dec 27, 1933 DOA: 11/08/2020 PCP: [name], MD    Brief Narrative:  87 year old gentleman with extensive medical history including history of multiple stroke, vascular dementia, hyperlipidemia, orthostatic hypotension, history of CABG brought to the emergency room with altered mental status.  Reportedly, he has been suffering from shaking and tremors for about 3 days.  No demonstrable seizure-like episodes.  No incontinence.  Also noted to have slurred speech and confusion. Patient has severe back pain problems and was recently given tramadol, last dose taken was 7/18.  Wife reports possible correlation with the use of tramadol. He was also diagnosed with COVID-19 at home test 1 month ago, wife reports negative testing 2 weeks after that. Admitted with altered mental status and shortness of breath. Patient was treated for enterococcal UTI with amoxicillin and dose was extended for presumed prostatitis.   Assessment & Plan:   Active Problems:   Acute metabolic encephalopathy  Acute metabolic encephalopathy: In a patient with underlying vascular dementia and advanced debility.  Multifactorial encephalopathy. Recent COVID infection, UTI, prostatitis, suspected aspiration pneumonia. Admission CT scan was without any acute findings.  MRI with poor quality but no demonstrable acute events.  EEG was normal. Ammonia was normal.  Vitamin levels are normal.  Carbon oxide level was normal. Some improvement in mental status, however remains very debilitated. No more tremors. See goal of care discussion below.  Acute hypoxemic respiratory failure: With recent history of COVID-19 infection.  More than 4 weeks since initial infections.  Chest x-ray consistent with asymmetrical pneumonia.  CRP 18.4.  More in favor for aspiration pneumonia or pneumonitis. Started on Unasyn, probably recurrent aspiration.  Remains on high flow oxygen  today. Since he is more awake today, will start chest physiotherapy as much possible. Trial of steroids for possible post-COVID syndrome.  Will discontinue.  No benefits. Keep on oxygen to keep saturation more than 90%.  Dementia: On Wellbutrin Namenda and Seroquel that will be resumed as much as he can take by mouth.  CKD stage IIIb: At about baseline.  Continue to monitor.  Enterococcal UTI/prostatitis: On amoxicillin.  Changed to Unasyn until clinical improvement.  Repeat cultures negative so far.  Hypothyroidism: Synthroid as above.  Acute on chronic back pain: Patient has severe back pain problems.  Struggling with pain management.  This may be contributing to altered mentation. Using around-the-clock Tylenol and a small dose of morphine with much improvement.  Also using lidocaine patch. Start mobilizing.   Essential hypertension: Blood pressures elevated now.  Uses Benicar as needed at home.  Resumed losartan 50 mg daily.  We will start controlling blood pressure with a goal to normalize it.  Hydralazine as needed to keep blood pressures less than 160.  Goal of care: Meeting with patient's wife at the bedside every day.  Patient with very advanced debility, chronic aspiration and now suspect ongoing aspiration with no meaningful eating.  Recurrent hospitalizations.  With his extensive medical issues, advancing dementia and now with no meaningful oral intake, he is probably at his end-of-life. I offered palliation and hospice discussion to her and she is agreeable to talk to palliative care and get more information about hospice also. No escalation of care, will continue current level of care.  DNR/DNI. He may best benefit with home with hospice.  DVT prophylaxis: SCD's Start: 11/18/20 1000   Code Status: DNR/DNI Family Communication: Wife at the bedside Disposition Plan: Status is: Inpatient  Remains inpatient appropriate  because:Altered mental status and Inpatient level of care  appropriate due to severity of illness  Dispo: The patient is from: Home              Anticipated d/c is to: Skilled nursing facility versus home with home hospice.              Patient currently is not medically stable to d/c.   Difficult to place patient No  Consultants:  Palliative care team. Procedures:  None  Antimicrobials:  Amoxicillin 7/20-7/21 Unasyn 7/21---   Subjective: Patient seen and examined.  Overnight events noted.  He had fluctuating oxygen requirement and currently on nonrebreather.  Nursing staff were trying to sit him upright and gave him some applesauce, however he started coughing on it.  Patient himself opens eyes to interview, answers few simple questions but not sure he is understanding everything. Wife at the bedside.  Objective: Vitals:   11/22/20 0831 11/22/20 1000 11/22/20 1030 11/22/20 1044  BP: (!) 181/117  (!) 169/94 (!) 164/92  Pulse: (!) 114 (!) 108  (!) 105  Resp: (!) '24 19 19 19  '$ Temp:      TempSrc:      SpO2: 91% 96%  98%  Weight:      Height:        Intake/Output Summary (Last 24 hours) at 11/22/2020 1121 Last data filed at 11/22/2020 1100 Gross per 24 hour  Intake 2753.69 ml  Output 100 ml  Net 2653.69 ml   Filed Weights   11/18/20 1751  Weight: 89.8 kg    Examination:  General: Sick looking.  Frail and debilitated gentleman, on high flow oxygen but not in any distress.  Today he is more quiet and composed, had received a dose of morphine early morning. Cardiovascular: S1-S2 normal.  Tachycardic.  Regular rate rhythm. Respiratory: Bilateral fair air entry.  No added sounds.  Currently on high flow oxygen. Gastrointestinal: Soft and nontender. Ext: No edema or cyanosis. Neuro: Patient is mostly sleepy.  Alert to stimulation, oriented x1 to place.  Generalized weakness.  Very much debilitated.      Data Reviewed: I have personally reviewed following labs and imaging studies  CBC: Recent Labs  Lab 11/21/2020 2124  11/19/20 0359 11/20/20 0337 11/21/20 0316 11/22/20 0419  WBC 7.6 9.1 9.5 8.6 14.9*  NEUTROABS  --  7.2 7.5 7.1 11.7*  HGB 10.5* 9.7* 9.9* 9.6* 9.9*  HCT 34.1* 31.6* 31.7* 31.6* 31.8*  MCV 106.2* 104.3* 103.6* 105.3* 104.3*  PLT 144* 167 195 150 [id number]*   Basic Metabolic Panel: Recent Labs  Lab 11/02/2020 2124 11/19/20 0359 11/20/20 0337 11/21/20 0316 11/22/20 0419  NA 142 143 144 146* 149*  K 4.3 4.2 4.2 3.9 4.4  CL 106 106 110 110 115*  CO2 '26 26 25 26 23  '$ GLUCOSE 129* 140* 169* 173* 184*  BUN 33* 36* 45* 45* 52*  CREATININE 2.45* 2.07* 2.04* 1.68* 1.86*  CALCIUM 8.6* 8.8* 8.6* 8.4* 8.6*   GFR: Estimated Creatinine Clearance: 31.9 mL/min (A) (by C-G formula based on SCr of 1.86 mg/dL (H)). Liver Function Tests: Recent Labs  Lab 11/13/2020 2124 11/19/20 0359 11/20/20 0337 11/21/20 0316 11/22/20 0419  AST 20 21 83* 59* 44*  ALT 9 12 64* 75* 66*  ALKPHOS 112 98 101 93 91  BILITOT 0.7 1.1 1.0 0.9 1.8*  PROT 6.3* 6.3* 6.2* 5.9* 5.8*  ALBUMIN 3.3* 3.3* 3.2* 3.0* 3.0*   No results for input(s): LIPASE, AMYLASE in the last 168  hours. Recent Labs  Lab 11/19/20 0940  AMMONIA 13   Coagulation Profile: Recent Labs  Lab 11/03/2020 2124  INR 1.1   Cardiac Enzymes: No results for input(s): CKTOTAL, CKMB, CKMBINDEX, TROPONINI in the last 168 hours. BNP (last 3 results) No results for input(s): PROBNP in the last 8760 hours. HbA1C: No results for input(s): HGBA1C in the last 72 hours.  CBG: Recent Labs  Lab 11/21/20 1648 11/21/20 1932 11/22/20 0401 11/22/20 0734 11/22/20 1117  GLUCAP 206* 203* 198* 170* 222*   Lipid Profile: No results for input(s): CHOL, HDL, LDLCALC, TRIG, CHOLHDL, LDLDIRECT in the last 72 hours.  Thyroid Function Tests: No results for input(s): TSH, T4TOTAL, FREET4, T3FREE, THYROIDAB in the last 72 hours. Anemia Panel: Recent Labs    11/21/20 0316 11/22/20 0419  FERRITIN 230 210   Sepsis Labs: Recent Labs  Lab 11/20/20 1018  11/21/20 0316  PROCALCITON 0.10 0.10    Recent Results (from the past 240 hour(s))  Resp Panel by RT-PCR (Flu A&B, Covid) Nasopharyngeal Swab     Status: Abnormal   Collection Time: 11/10/2020 10:05 PM   Specimen: Nasopharyngeal Swab; Nasopharyngeal(NP) swabs in vial transport medium  Result Value Ref Range Status   SARS Coronavirus 2 by RT PCR POSITIVE (A) NEGATIVE Final    Comment: RESULT CALLED TO, READ BACK BY AND VERIFIED WITH: BONIS, RN @ [id number] ON 11/18/20 C VARNER (NOTE) SARS-CoV-2 target nucleic acids are DETECTED.  The SARS-CoV-2 RNA is generally detectable in upper respiratory specimens during the acute phase of infection. Positive results are indicative of the presence of the identified virus, but do not rule out bacterial infection or co-infection with other pathogens not detected by the test. Clinical correlation with patient history and other diagnostic information is necessary to determine patient infection status. The expected result is Negative.  Fact Sheet for Patients: EntrepreneurPulse.com.au  Fact Sheet for Healthcare Providers: IncredibleEmployment.be  This test is not yet approved or cleared by the [place] FDA and  has been authorized for detection and/or diagnosis of SARS-CoV-2 by FDA under an Emergency Use Authorization (EUA).  This EUA will remain in effect (meaning this test can  be used) for the duration of  the COVID-19 declaration under Section 564(b)(1) of the Act, 21 U.S.C. section 360bbb-3(b)(1), unless the authorization is terminated or revoked sooner.     Influenza A by PCR NEGATIVE NEGATIVE Final   Influenza B by PCR NEGATIVE NEGATIVE Final    Comment: (NOTE) The Xpert Xpress SARS-CoV-2/FLU/RSV plus assay is intended as an aid in the diagnosis of influenza from Nasopharyngeal swab specimens and should not be used as a sole basis for treatment. Nasal washings and aspirates are unacceptable for Xpert  Xpress SARS-CoV-2/FLU/RSV testing.  Fact Sheet for Patients: EntrepreneurPulse.com.au  Fact Sheet for Healthcare Providers: IncredibleEmployment.be  This test is not yet approved or cleared by the [place] FDA and has been authorized for detection and/or diagnosis of SARS-CoV-2 by FDA under an Emergency Use Authorization (EUA). This EUA will remain in effect (meaning this test can be used) for the duration of the COVID-19 declaration under Section 564(b)(1) of the Act, 21 U.S.C. section 360bbb-3(b)(1), unless the authorization is terminated or revoked.  Performed at [facility], [place] [address], [place], [place] [address]   Urine Culture     Status: None   Collection Time: 11/18/20  4:05 AM   Specimen: Urine, Clean Catch  Result Value Ref Range Status   Specimen Description   Final  URINE, CLEAN CATCH Performed at [facility], [place] [address] [place], [place] [address]    Special Requests   Final    NONE Performed at [facility], [place] [address] [place], [place] [address]    Culture   Final    NO GROWTH Performed at [facility], [place] [address], [place], [place] [address]    Report Status 11/19/2020 FINAL  Final  Culture, blood (routine x 2)     Status: None (Preliminary result)   Collection Time: 11/19/20  9:40 AM   Specimen: BLOOD  Result Value Ref Range Status   Specimen Description   Final    BLOOD LEFT ANTECUBITAL Performed at [place] [address] [place], [place] [address]    Special Requests   Final    BOTTLES DRAWN AEROBIC AND ANAEROBIC Blood Culture adequate volume Performed at [place] [address] [place], [place] [address]    Culture   Final    NO GROWTH 3 DAYS Performed at [facility], [place] [address] [place], [place] [address]    Report Status PENDING  Incomplete  Culture, blood  (routine x 2)     Status: None (Preliminary result)   Collection Time: 11/19/20  9:40 AM   Specimen: BLOOD RIGHT HAND  Result Value Ref Range Status   Specimen Description   Final    BLOOD RIGHT HAND Performed at [place] [address], [place], [place] [address]    Special Requests   Final    BOTTLES DRAWN AEROBIC ONLY Blood Culture adequate volume Performed at [place] [address], [place], [place] [address]    Culture   Final    NO GROWTH 3 DAYS Performed at [facility], [place] [address] [place], [place] [address]    Report Status PENDING  Incomplete  Urine Culture     Status: None   Collection Time: 11/19/20  5:49 PM   Specimen: Urine, Clean Catch  Result Value Ref Range Status   Specimen Description   Final    URINE, CLEAN CATCH Performed at [facility], [place] [address] [place], [place] [address]    Special Requests   Final    NONE Performed at [facility], [place] [address] [place], [place] [address]    Culture   Final    NO GROWTH Performed at [facility], [place] [address], [place],  [address]    Report Status 11/21/2020 FINAL  Final         Radiology Studies: MR BRAIN WO CONTRAST  Result Date: 11/21/2020 CLINICAL DATA:  Transient ischemic attack (TIA).  Confusion. EXAM: MRI HEAD WITHOUT CONTRAST TECHNIQUE: Multiplanar, multiecho pulse sequences of the brain and surrounding structures were obtained without intravenous contrast. COMPARISON:  Head CT 11/18/2020 and MRI 11/19/2019 FINDINGS: Multiple sequences are moderately motion degraded despite repeat imaging. Brain: There is no evidence of an acute infarct, mass, midline shift, or extra-axial fluid collection. There are a few chronic cerebral microhemorrhages which have increased in number from the prior MRI including a new microhemorrhage in the dorsal left thalamus. A chronic microhemorrhage in the right  cerebellar hemisphere is either new or more conspicuous. Confluent T2 hyperintensities in the cerebral white matter and patchy T2 hyperintensities in the brainstem have likely mildly progressed from the prior MRI and are nonspecific but compatible with severe chronic small vessel ischemic disease.  There are chronic lacunar infarcts in the cerebellum bilaterally, left pons, bilateral cerebral white matter, basal ganglia, and thalami. Wallerian degeneration is noted along the left corticospinal tract in the posterior limb of the internal capsule extending into the brainstem. There is moderate cerebral atrophy. Vascular: Major intracranial vascular flow voids are preserved. Skull and upper cervical spine: Unremarkable bone marrow signal para Sinuses/Orbits: Bilateral cataract extraction. Mild bilateral ethmoid air cell mucosal thickening. Trace right mastoid fluid. Other: None. IMPRESSION: 1. Motion degraded examination without evidence of an acute intracranial abnormality. 2. Severe chronic small vessel ischemic disease with multiple chronic lacunar infarcts. Electronically Signed   By: Logan Bores M.D.   On: 11/21/2020 13:21   EEG adult  Result Date: 11/20/2020 Lora Havens, MD     11/20/2020  4:53 PM Patient Name: RAISHAWN NAZAIRE MRN: [id number] Epilepsy Attending: Lora Havens Referring Physician/Provider: Dr Barb Merino Date: 11/20/2020 Duration: 22.39 mins Patient history: 87yo M with ams. EEG to evaluate for seizure Level of alertness:  lethargic AEDs during EEG study: None Technical aspects: This EEG study was done with scalp electrodes positioned according to the 10-20 International system of electrode placement. Electrical activity was acquired at a sampling rate of '500Hz'$  and reviewed with a high frequency filter of '70Hz'$  and a low frequency filter of '1Hz'$ . EEG data were recorded continuously and digitally stored. Description: EEG showed continuous generalized 5 to 8 Hz theta-alpha activity as  well as 2-'3Hz'$  delta slowing. Hyperventilation and photic stimulation were not performed.   ABNORMALITY - Continuous slow, generalized IMPRESSION: This study is suggestive of moderate diffuse encephalopathy, nonspecific etiology. No seizures or epileptiform discharges were seen throughout the recording. Priyanka Barbra Sarks        Scheduled Meds:  acetaminophen (TYLENOL) oral liquid 160 mg/5 mL  1,000 mg Oral Q6H   aspirin EC  81 mg Oral Daily   buPROPion  150 mg Oral Daily   fludrocortisone  0.1 mg Oral BID   Ipratropium-Albuterol  1 puff Inhalation Q6H   levothyroxine  25 mcg Oral Q0600   losartan  50 mg Oral Daily   memantine  10 mg Oral BID   multivitamin with minerals  1 tablet Oral Daily   pantoprazole  40 mg Oral Daily   pravastatin  40 mg Oral Daily   QUEtiapine  25 mg Oral TID   Continuous Infusions:  ampicillin-sulbactam (UNASYN) IV 3 g (11/22/20 0514)   dextrose 5 % and 0.9% NaCl 100 mL/hr at 11/22/20 0440     LOS: 4 days    Time spent: 30 minutes    Barb Merino, MD [facility] Pager [phone]

## 2020-11-22 NOTE — Progress Notes (Signed)
Patient was reevaluated at the bedside.  Wife stayed at the bedside all the time.  Nursing also had a concern that patient is getting more lethargic and less responsive since morning.  On repeat examination, he is lethargic and barely able to respond.  Currently on 15 L nonrebreather.  Mental status has worsened since morning.  Not awake enough to do any feeding trial. I spoke with patient's daughter and son in law on the speaker phone, patient's wife at the bedside and we discussed about different options including end-of-life care.  Wife wanted to explore options of going home with home hospice.  Family was also interested whether [facility] is any option for hospice. After careful discussion, we decided to start comfort care measures in the hospital, no escalation of care.  If he remains fairly stable and able to transfer to home, family wants him to go home with home hospice.  I explained to the family that the way he is deteriorating so fast, he may peacefully pass away in the hospital.  Plan: Comfort care measures.  All symptom control medications including nausea medicine, constipation medicine, pain and anxiety relief available. Chaplain consulted for visit. End-of-life care will be provided in the hospital until transitioning. RN can pronounce death if happens in the hospital. Unrestricted visitor policy with precautions and new hospital policy for COVID-positive patient. Will reevaluate tomorrow morning, depending upon his clinical status will call hospice for transitioning to home.

## 2020-11-23 DIAGNOSIS — G9341 Metabolic encephalopathy: Secondary | ICD-10-CM | POA: Diagnosis not present

## 2020-11-23 MED ORDER — MORPHINE 100MG IN NS 100ML (1MG/ML) PREMIX INFUSION
5.0000 mg/h | INTRAVENOUS | Status: DC
  Filled 2020-11-23: qty 100

## 2020-11-24 LAB — CULTURE, BLOOD (ROUTINE X 2)
Culture: NO GROWTH
Culture: NO GROWTH
Special Requests: ADEQUATE
Special Requests: ADEQUATE

## 2020-11-25 ENCOUNTER — Telehealth: Payer: Self-pay | Admitting: Cardiology

## 2020-11-25 NOTE — Telephone Encounter (Signed)
New Message:    Wife said patient passed away on 03-Dec-2020. She wants to know what does she need to do with his Loop Recorder?

## 2020-11-25 NOTE — Telephone Encounter (Signed)
With pt spouse.  Advised a return kit will be mailed from [employer] to return monitor for loop implant.  [employer] and [employer] updated.

## 2020-11-30 NOTE — Progress Notes (Signed)
Patient seen and examined.  Since he was started on comfort care measures since yesterday evening, he has not woken up.  Patient's wife, daughter, son-in-law and son at the bedside.  Patient looked comfortable after 2 mg of morphine and a dose of Ativan.  He was needing frequent morphine injection to keep him comfortable.  Patient is lethargic and barely responding.  Response to deep stimulation and bed repositioning but mostly looks distressed on any response.  Wife tells me that he had expressed back pain worsening when they were trying to move him early morning. Patient frequently looks uncomfortable and with irregular breathing pattern.  End-of-life care Metabolic encephalopathy in a patient with dementia COVID-19 pneumonia With acute hypoxemic respiratory failure Enterococcal UTI Frailty and advanced debility, dysphagia.  Plan: With irregular breathing pattern and discomfort, patient will benefit with continuous IV infusion with morphine. All other symptom control medications are available. Patient will probably die in the hospital, no options for transfer. Support and listening offered to the patient's family. RN to pronounce death.  Total time spent: 25 minutes.  Note: By the time this note was created and finalized after initial patient care, patient has expired with family at the bedside.  See details in death summary.

## 2020-11-30 NOTE — Death Summary Note (Signed)
DEATH SUMMARY   Patient Details  Name: [name] MRN: [id number] DOB: 05/13/1933  Admission/Discharge Information   Admit Date:  Nov 22, 2020  Date of Death: Date of Death: 11-28-2020  Time of Death: Time of Death: 0959  Length of Stay: 5  Referring Physician: [name], MD   Reason(s) for Hospitalization  Encephalopathy   Diagnoses  Preliminary cause of death:  Secondary Diagnoses (including complications and co-morbidities):  Principal Problem:   Acute metabolic encephalopathy Active Problems:   End of life care   [facility] Course (including significant findings, care, treatment, and services provided and events leading to death)  [name] is a 87 y.o. year old male who has extensive medical history including multiple strokes, vascular dementia, hyperlipidemia, orthostatic hypotension, coronary artery disease status post CABG who was brought to the emergency room with altered mental status, shaking and tremors.  He also has debilitating back pain problems and was recently using tramadol.  Patient was also recently diagnosed with COVID-19.  He was also recently diagnosed with enterococcal UTI and suspected prostatitis.  Patient was admitted to the hospital and treated for acute metabolic encephalopathy likely secondary to multiple factors including advanced dementia and debility, recent COVID-19 infection, UTI, suspected aspiration pneumonia. CT scans and MRI did not show any evidence of focal lesions.  EEG was normal.  Blood cultures were negative.  Repeat urine culture was negative. Patient with advanced debility, chronic aspiration and now suspected ongoing aspiration with no meaningful eating.  Patient remained in for clinical status, worsening respiratory status and suffering from multiple medical issues.  He became more encephalopathic.  Our goal of care discussion was done and given his nearing end-of-life, he was ultimately provided end-of-life care in  the hospital as comfort care measures.  While under comfort care measures, patient died in the hospital with family at the bedside.  Primary cause of death Acute metabolic encephalopathy due to aspiration pneumonia, COVID-19 infection Other comorbidities Vascular dementia, chronic kidney disease stage IIIb, frailty and debility, hypertension.    Pertinent Labs and Studies  Significant Diagnostic Studies CT Head Wo Contrast  Result Date: 11-22-2020 CLINICAL DATA:  Mental status change EXAM: CT HEAD WITHOUT CONTRAST TECHNIQUE: Contiguous axial images were obtained from the base of the skull through the vertex without intravenous contrast. COMPARISON:  CT brain 10/08/2020 FINDINGS: Brain: No acute territorial infarction, hemorrhage, or intracranial mass. There is motion degradation. Atrophy and extensive white matter disease. Chronic infarcts within the bilateral white matter, left basal ganglia and bilateral thalamus. Small chronic infarct in the right cerebellum. Stable ventricle size. Vascular: No hyperdense vessel.  Carotid vascular calcification Skull: Normal. Negative for fracture or focal lesion. Sinuses/Orbits: Mucosal thickening or cyst in the left frontal sinus. Other: None IMPRESSION: 1. Slight motion degradation limits the exam 2. Atrophy and extensive white matter disease. Multiple small chronic infarcts. No definite CT evidence for acute intracranial abnormality Electronically Signed   By: Donavan Foil M.D.   On: 11-22-2020 22:33   MR BRAIN WO CONTRAST  Result Date: 11/21/2020 CLINICAL DATA:  Transient ischemic attack (TIA).  Confusion. EXAM: MRI HEAD WITHOUT CONTRAST TECHNIQUE: Multiplanar, multiecho pulse sequences of the brain and surrounding structures were obtained without intravenous contrast. COMPARISON:  Head CT 11/22/20 and MRI 11/19/2019 FINDINGS: Multiple sequences are moderately motion degraded despite repeat imaging. Brain: There is no evidence of an acute infarct, mass,  midline shift, or extra-axial fluid collection. There are a few chronic cerebral microhemorrhages which have increased in number from  the prior MRI including a new microhemorrhage in the dorsal left thalamus. A chronic microhemorrhage in the right cerebellar hemisphere is either new or more conspicuous. Confluent T2 hyperintensities in the cerebral white matter and patchy T2 hyperintensities in the brainstem have likely mildly progressed from the prior MRI and are nonspecific but compatible with severe chronic small vessel ischemic disease. There are chronic lacunar infarcts in the cerebellum bilaterally, left pons, bilateral cerebral white matter, basal ganglia, and thalami. Wallerian degeneration is noted along the left corticospinal tract in the posterior limb of the internal capsule extending into the brainstem. There is moderate cerebral atrophy. Vascular: Major intracranial vascular flow voids are preserved. Skull and upper cervical spine: Unremarkable bone marrow signal para Sinuses/Orbits: Bilateral cataract extraction. Mild bilateral ethmoid air cell mucosal thickening. Trace right mastoid fluid. Other: None. IMPRESSION: 1. Motion degraded examination without evidence of an acute intracranial abnormality. 2. Severe chronic small vessel ischemic disease with multiple chronic lacunar infarcts. Electronically Signed   By: Logan Bores M.D.   On: 11/21/2020 13:21   DG CHEST PORT 1 VIEW  Result Date: 11/19/2020 CLINICAL DATA:  COVID positive. EXAM: PORTABLE CHEST 1 VIEW COMPARISON:  11/18/2020. FINDINGS: Prior CABG. Stable cardiomegaly. No pulmonary venous congestion. Progressive right lung interstitial infiltrate. Left lung base interstitial infiltrate also noted. No prominent pleural effusion. No pneumothorax. IMPRESSION: 1. Prior CABG. Stable cardiomegaly. No pulmonary venous congestion. 2. Progressive right lung interstitial infiltrate. Left lung base interstitial infiltrate also noted. Electronically  Signed   By: Marcello Moores  Register   On: 11/19/2020 08:10   DG Chest Port 1 View  Result Date: 11/21/2020 CLINICAL DATA:  Altered mental status EXAM: PORTABLE CHEST 1 VIEW COMPARISON:  10/05/2020 FINDINGS: Post sternotomy changes. Electronic recording device over the left chest. Cardiomegaly. Asymmetric interstitial and ground-glass opacity throughout the right thorax, favor diffuse pneumonia over asymmetric edema. No pleural effusion or pneumothorax. IMPRESSION: 1. Asymmetric diffuse interstitial and ground-glass opacity in the right thorax favored to represent diffuse pneumonia over asymmetric edema 2. Cardiomegaly Electronically Signed   By: Donavan Foil M.D.   On: 11/28/2020 22:34   EEG adult  Result Date: 11/20/2020 Lora Havens, MD     11/20/2020  4:53 PM Patient Name: YASSEN SHOLAR MRN: [id number] Epilepsy Attending: Lora Havens Referring Physician/Provider: Dr Barb Merino Date: 11/20/2020 Duration: 22.39 mins Patient history: 87yo M with ams. EEG to evaluate for seizure Level of alertness:  lethargic AEDs during EEG study: None Technical aspects: This EEG study was done with scalp electrodes positioned according to the 10-20 International system of electrode placement. Electrical activity was acquired at a sampling rate of '500Hz'$  and reviewed with a high frequency filter of '70Hz'$  and a low frequency filter of '1Hz'$ . EEG data were recorded continuously and digitally stored. Description: EEG showed continuous generalized 5 to 8 Hz theta-alpha activity as well as 2-'3Hz'$  delta slowing. Hyperventilation and photic stimulation were not performed.   ABNORMALITY - Continuous slow, generalized IMPRESSION: This study is suggestive of moderate diffuse encephalopathy, nonspecific etiology. No seizures or epileptiform discharges were seen throughout the recording. Lora Havens    Microbiology Recent Results (from the past 240 hour(s))  Resp Panel by RT-PCR (Flu A&B, Covid) Nasopharyngeal Swab      Status: Abnormal   Collection Time: 11/10/2020 10:05 PM   Specimen: Nasopharyngeal Swab; Nasopharyngeal(NP) swabs in vial transport medium  Result Value Ref Range Status   SARS Coronavirus 2 by RT PCR POSITIVE (A) NEGATIVE Final    Comment:  RESULT CALLED TO, READ BACK BY AND VERIFIED WITH: BONIS, RN @ [phone] ON 11/18/20 C VARNER (NOTE) SARS-CoV-2 target nucleic acids are DETECTED.  The SARS-CoV-2 RNA is generally detectable in upper respiratory specimens during the acute phase of infection. Positive results are indicative of the presence of the identified virus, but do not rule out bacterial infection or co-infection with other pathogens not detected by the test. Clinical correlation with patient history and other diagnostic information is necessary to determine patient infection status. The expected result is Negative.  Fact Sheet for Patients: EntrepreneurPulse.com.au  Fact Sheet for Healthcare Providers: IncredibleEmployment.be  This test is not yet approved or cleared by the [place] FDA and  has been authorized for detection and/or diagnosis of SARS-CoV-2 by FDA under an Emergency Use Authorization (EUA).  This EUA will remain in effect (meaning this test can  be used) for the duration of  the COVID-19 declaration under Section 564(b)(1) of the Act, 21 U.S.C. section 360bbb-3(b)(1), unless the authorization is terminated or revoked sooner.     Influenza A by PCR NEGATIVE NEGATIVE Final   Influenza B by PCR NEGATIVE NEGATIVE Final    Comment: (NOTE) The Xpert Xpress SARS-CoV-2/FLU/RSV plus assay is intended as an aid in the diagnosis of influenza from Nasopharyngeal swab specimens and should not be used as a sole basis for treatment. Nasal washings and aspirates are unacceptable for Xpert Xpress SARS-CoV-2/FLU/RSV testing.  Fact Sheet for Patients: EntrepreneurPulse.com.au  Fact Sheet for Healthcare  Providers: IncredibleEmployment.be  This test is not yet approved or cleared by the [place] FDA and has been authorized for detection and/or diagnosis of SARS-CoV-2 by FDA under an Emergency Use Authorization (EUA). This EUA will remain in effect (meaning this test can be used) for the duration of the COVID-19 declaration under Section 564(b)(1) of the Act, 21 U.S.C. section 360bbb-3(b)(1), unless the authorization is terminated or revoked.  Performed at [facility], [place] [address] [place], [place] [address]   Urine Culture     Status: None   Collection Time: 11/18/20  4:05 AM   Specimen: Urine, Clean Catch  Result Value Ref Range Status   Specimen Description   Final    URINE, CLEAN CATCH Performed at [facility], [place] [address], [place], [place] [address]    Special Requests   Final    NONE Performed at [facility], [place] [address] [place], [place] [address]    Culture   Final    NO GROWTH Performed at [facility], [place] [address] [place], [place] [address]    Report Status 11/19/2020 FINAL  Final  Culture, blood (routine x 2)     Status: None (Preliminary result)   Collection Time: 11/19/20  9:40 AM   Specimen: BLOOD  Result Value Ref Range Status   Specimen Description   Final    BLOOD LEFT ANTECUBITAL Performed at [place] [address], [place], [place] [address]    Special Requests   Final    BOTTLES DRAWN AEROBIC AND ANAEROBIC Blood Culture adequate volume Performed at [place] [address], [place], [place] [address]    Culture   Final    NO GROWTH 4 DAYS Performed at [facility], [place] [address] [place], [place] [address]    Report Status PENDING  Incomplete  Culture, blood (routine x 2)     Status: None (Preliminary result)   Collection Time: 11/19/20  9:40 AM   Specimen: BLOOD RIGHT HAND  Result Value Ref  Range Status   Specimen Description   Final    BLOOD RIGHT HAND Performed at [place] [address] [place], [place] [address]    Special Requests   Final    BOTTLES DRAWN AEROBIC ONLY Blood Culture adequate volume Performed at [place] [address], [place], [place] [address]    Culture   Final    NO GROWTH 4 DAYS Performed at [facility], [place] [address], [place], [place] [address]    Report Status PENDING  Incomplete  Urine Culture     Status: None   Collection Time: 11/19/20  5:49 PM   Specimen: Urine, Clean Catch  Result Value Ref Range Status   Specimen Description   Final    URINE, CLEAN CATCH Performed at [facility], [place] [address] [place], [place] [address]    Special Requests   Final    NONE Performed at [facility], [place] [address], [place], [place] [address]    Culture   Final    NO GROWTH Performed at [facility], [place] [address], [place],  [address]    Report Status 11/21/2020 FINAL  Final    Lab Basic Metabolic Panel: Recent Labs  Lab 10/31/2020 2124 11/19/20 0359 11/20/20 0337 11/21/20 0316 11/22/20 0419  NA 142 143 144 146* 149*  K 4.3 4.2 4.2 3.9 4.4  CL 106 106 110 110 115*  CO2 '26 26 25 26 23  '$ GLUCOSE 129* 140* 169* 173* 184*  BUN 33* 36* 45* 45* 52*  CREATININE 2.45* 2.07* 2.04* 1.68* 1.86*  CALCIUM 8.6* 8.8* 8.6* 8.4* 8.6*   Liver Function Tests: Recent Labs  Lab 11/13/2020 2124 11/19/20 0359 11/20/20 0337 11/21/20 0316 11/22/20 0419  AST 20 21 83* 59* 44*  ALT 9 12 64* 75* 66*  ALKPHOS 112 98 101 93 91  BILITOT 0.7 1.1 1.0 0.9 1.8*  PROT 6.3* 6.3* 6.2* 5.9* 5.8*  ALBUMIN 3.3* 3.3* 3.2* 3.0* 3.0*   No results for input(s): LIPASE, AMYLASE in the last 168 hours. Recent Labs  Lab 11/19/20 0940  AMMONIA 13   CBC: Recent Labs  Lab 11/08/2020 2124 11/19/20 0359 11/20/20 0337 11/21/20 0316 11/22/20 0419  WBC 7.6 9.1  9.5 8.6 14.9*  NEUTROABS  --  7.2 7.5 7.1 11.7*  HGB 10.5* 9.7* 9.9* 9.6* 9.9*  HCT 34.1* 31.6* 31.7* 31.6* 31.8*  MCV 106.2* 104.3* 103.6* 105.3* 104.3*  PLT 144* 167 195 150 126*   Cardiac Enzymes: No results for input(s): CKTOTAL, CKMB, CKMBINDEX, TROPONINI in the last 168 hours. Sepsis Labs: Recent Labs  Lab 11/19/20 0359 11/20/20 0337 11/20/20 1018 11/21/20 0316 11/22/20 0419  PROCALCITON  --   --  0.10 0.10  --   WBC 9.1 9.5  --  8.6 14.9*    Procedures/Operations     Khalfani Weideman 12-19-2020, 11:54 AM

## 2020-11-30 NOTE — Progress Notes (Signed)
Pt unresponsive wife and other family members were present. They talked about the pt's love for bluegrass and christian music. They said they were connected to a [employer]. The had been in to visit last evening. The chaplain offered caring and supportive presence, while singing Amazing Shirlee [place] the pt breathed his last. The family was appropriately tearful . I offered condolences and sympathies.

## 2020-11-30 NOTE — Care Management Important Message (Signed)
Important Message  Patient Details IM Letter placed door caddy. Name: AURICK LORETTO MRN: [id number] Date of Birth: 24-May-1933   Medicare Important Message Given:  Yes     Kerin Salen 2020/12/20, 12:36 PM

## 2020-11-30 DEATH — deceased

## 2020-12-30 ENCOUNTER — Ambulatory Visit: Payer: Medicare Other | Admitting: Vascular Surgery

## 2020-12-30 ENCOUNTER — Encounter (HOSPITAL_COMMUNITY): Payer: Medicare Other

## 2020-12-30 ENCOUNTER — Other Ambulatory Visit (HOSPITAL_COMMUNITY): Payer: Medicare Other

## 2021-01-14 ENCOUNTER — Telehealth: Payer: Self-pay

## 2021-01-14 NOTE — Telephone Encounter (Signed)
Patient wife called and left vm that she never received a return kit for patient monitor. I have called her back with no response and unable to leave a vm 

## 2021-03-31 ENCOUNTER — Ambulatory Visit: Payer: Medicare Other | Admitting: Neurology

## 2021-06-09 ENCOUNTER — Ambulatory Visit: Payer: Medicare Other | Admitting: Adult Health Nurse Practitioner

## 2021-10-14 ENCOUNTER — Encounter: Payer: Medicare Other | Admitting: Internal Medicine
